# Patient Record
Sex: Male | Born: 1950
Health system: Southern US, Community
[De-identification: ages and names within clinical notes are randomized; demographics above are authoritative.]

## PROBLEM LIST (undated history)

## (undated) DIAGNOSIS — I10 Essential (primary) hypertension: Secondary | ICD-10-CM

## (undated) DIAGNOSIS — I5022 Chronic systolic (congestive) heart failure: Secondary | ICD-10-CM

## (undated) DIAGNOSIS — F329 Major depressive disorder, single episode, unspecified: Secondary | ICD-10-CM

## (undated) DIAGNOSIS — I739 Peripheral vascular disease, unspecified: Secondary | ICD-10-CM

## (undated) DIAGNOSIS — N189 Chronic kidney disease, unspecified: Secondary | ICD-10-CM

## (undated) DIAGNOSIS — E119 Type 2 diabetes mellitus without complications: Secondary | ICD-10-CM

## (undated) DIAGNOSIS — F909 Attention-deficit hyperactivity disorder, unspecified type: Secondary | ICD-10-CM

## (undated) DIAGNOSIS — N183 Chronic kidney disease, stage 3 unspecified: Secondary | ICD-10-CM

## (undated) DIAGNOSIS — I509 Heart failure, unspecified: Secondary | ICD-10-CM

## (undated) DIAGNOSIS — R188 Other ascites: Secondary | ICD-10-CM

## (undated) DIAGNOSIS — E0822 Diabetes mellitus due to underlying condition with diabetic chronic kidney disease: Secondary | ICD-10-CM

## (undated) DIAGNOSIS — G4733 Obstructive sleep apnea (adult) (pediatric): Secondary | ICD-10-CM

## (undated) DIAGNOSIS — F32A Depression, unspecified: Secondary | ICD-10-CM

## (undated) DIAGNOSIS — I4891 Unspecified atrial fibrillation: Secondary | ICD-10-CM

## (undated) DIAGNOSIS — B353 Tinea pedis: Secondary | ICD-10-CM

## (undated) DIAGNOSIS — R011 Cardiac murmur, unspecified: Secondary | ICD-10-CM

## (undated) HISTORY — DX: Cardiac murmur, unspecified: R01.1

## (undated) HISTORY — PX: NOSE SURGERY: SHX723

## (undated) HISTORY — DX: Attention-deficit hyperactivity disorder, unspecified type: F90.9

## (undated) HISTORY — DX: Diabetes mellitus due to underlying condition with diabetic chronic kidney disease: E08.22

## (undated) HISTORY — PX: VASECTOMY: SHX75

## (undated) HISTORY — DX: Tinea pedis: B35.3
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
## 1998-12-04 ENCOUNTER — Ambulatory Visit (HOSPITAL_COMMUNITY): Admission: RE | Admit: 1998-12-04 | Discharge: 1998-12-05 | Payer: Self-pay | Admitting: *Deleted

## 1999-05-14 ENCOUNTER — Ambulatory Visit (HOSPITAL_COMMUNITY): Admission: RE | Admit: 1999-05-14 | Discharge: 1999-05-15 | Payer: Self-pay | Admitting: *Deleted

## 2003-03-03 ENCOUNTER — Inpatient Hospital Stay (HOSPITAL_COMMUNITY): Admission: AD | Admit: 2003-03-03 | Discharge: 2003-03-08 | Payer: Self-pay | Admitting: Psychiatry

## 2009-02-21 ENCOUNTER — Emergency Department (HOSPITAL_COMMUNITY): Admission: EM | Admit: 2009-02-21 | Discharge: 2009-02-21 | Payer: Self-pay | Admitting: Emergency Medicine

## 2009-02-22 ENCOUNTER — Inpatient Hospital Stay (HOSPITAL_COMMUNITY): Admission: EM | Admit: 2009-02-22 | Discharge: 2009-03-06 | Payer: Self-pay | Admitting: Psychiatry

## 2009-02-22 ENCOUNTER — Ambulatory Visit: Payer: Self-pay | Admitting: Psychiatry

## 2009-03-30 ENCOUNTER — Emergency Department (HOSPITAL_COMMUNITY): Admission: EM | Admit: 2009-03-30 | Discharge: 2009-03-30 | Payer: Self-pay | Admitting: Emergency Medicine

## 2009-03-31 ENCOUNTER — Inpatient Hospital Stay (HOSPITAL_COMMUNITY): Admission: EM | Admit: 2009-03-31 | Discharge: 2009-04-14 | Payer: Self-pay | Admitting: Psychiatry

## 2009-03-31 ENCOUNTER — Ambulatory Visit: Payer: Self-pay | Admitting: Psychiatry

## 2009-03-31 ENCOUNTER — Emergency Department (HOSPITAL_COMMUNITY): Admission: EM | Admit: 2009-03-31 | Discharge: 2009-03-31 | Payer: Self-pay | Admitting: Emergency Medicine

## 2009-06-17 ENCOUNTER — Ambulatory Visit: Payer: Self-pay | Admitting: Family Medicine

## 2009-07-21 ENCOUNTER — Emergency Department (HOSPITAL_COMMUNITY): Admission: EM | Admit: 2009-07-21 | Discharge: 2009-07-21 | Payer: Self-pay | Admitting: Emergency Medicine

## 2009-09-25 ENCOUNTER — Ambulatory Visit: Payer: Self-pay | Admitting: Internal Medicine

## 2009-09-25 ENCOUNTER — Encounter: Payer: Self-pay | Admitting: Family Medicine

## 2009-09-25 LAB — CONVERTED CEMR LAB
AST: 21 units/L (ref 0–37)
Albumin: 4.5 g/dL (ref 3.5–5.2)
Alkaline Phosphatase: 34 units/L — ABNORMAL LOW (ref 39–117)
Basophils Absolute: 0 10*3/uL (ref 0.0–0.1)
Benzodiazepines.: NEGATIVE
Creatinine,U: 238.5 mg/dL
Hemoglobin: 13.4 g/dL (ref 13.0–17.0)
LDL Cholesterol: 83 mg/dL (ref 0–99)
Lymphocytes Relative: 28 % (ref 12–46)
Monocytes Absolute: 0.5 10*3/uL (ref 0.1–1.0)
Neutro Abs: 3.9 10*3/uL (ref 1.7–7.7)
Opiate Screen, Urine: NEGATIVE
Phencyclidine (PCP): NEGATIVE
Platelets: 207 10*3/uL (ref 150–400)
Potassium: 3.7 meq/L (ref 3.5–5.3)
Propoxyphene: NEGATIVE
RDW: 13.7 % (ref 11.5–15.5)
Sodium: 144 meq/L (ref 135–145)
TSH: 2.049 microintl units/mL (ref 0.350–4.500)
Total Protein: 7.4 g/dL (ref 6.0–8.3)

## 2009-10-02 ENCOUNTER — Ambulatory Visit: Payer: Self-pay | Admitting: Internal Medicine

## 2011-02-16 LAB — BASIC METABOLIC PANEL
BUN: 19 mg/dL (ref 6–23)
CO2: 34 mEq/L — ABNORMAL HIGH (ref 19–32)
Calcium: 9.3 mg/dL (ref 8.4–10.5)
Chloride: 97 mEq/L (ref 96–112)
Creatinine, Ser: 1.22 mg/dL (ref 0.4–1.5)
GFR calc Af Amer: >60 mL/min (ref >60)
GFR calc non Af Amer: >60 mL/min (ref >60)
Glucose, Bld: 117 mg/dL — ABNORMAL HIGH (ref 70–99)
Potassium: 3.7 mEq/L (ref 3.5–5.1)
Sodium: 138 mEq/L (ref 135–145)

## 2011-02-17 LAB — RAPID URINE DRUG SCREEN, HOSP PERFORMED
Amphetamines: NOT DETECTED
Benzodiazepines: NOT DETECTED
Cocaine: NOT DETECTED

## 2011-02-17 LAB — CBC
HCT: 40.6 % (ref 39.0–52.0)
Platelets: 194 10*3/uL (ref 150–400)
WBC: 8.4 10*3/uL (ref 4.0–10.5)

## 2011-02-17 LAB — BASIC METABOLIC PANEL
BUN: 10 mg/dL (ref 6–23)
Calcium: 9.1 mg/dL (ref 8.4–10.5)
GFR calc non Af Amer: >60 mL/min (ref >60)
Potassium: 3.2 mEq/L — ABNORMAL LOW (ref 3.5–5.1)

## 2011-02-17 LAB — DIFFERENTIAL
Basophils Absolute: 0 10*3/uL (ref 0.0–0.1)
Eosinophils Relative: 1 % (ref 0–5)
Lymphocytes Relative: 20 % (ref 12–46)
Lymphs Abs: 1.7 10*3/uL (ref 0.7–4.0)
Neutro Abs: 6 10*3/uL (ref 1.7–7.7)
Neutrophils Relative %: 72 % (ref 43–77)

## 2011-02-17 LAB — ETHANOL: Alcohol, Ethyl (B): <5 mg/dL (ref 0–10)

## 2011-02-18 LAB — BASIC METABOLIC PANEL
BUN: 23 mg/dL (ref 6–23)
Calcium: 9.2 mg/dL (ref 8.4–10.5)
Creatinine, Ser: 0.97 mg/dL (ref 0.4–1.5)
GFR calc Af Amer: >60 mL/min (ref >60)

## 2011-02-18 LAB — DIFFERENTIAL
Lymphocytes Relative: 23 % (ref 12–46)
Lymphs Abs: 2 10*3/uL (ref 0.7–4.0)
Monocytes Absolute: 0.8 10*3/uL (ref 0.1–1.0)
Monocytes Relative: 9 % (ref 3–12)
Neutro Abs: 5.9 10*3/uL (ref 1.7–7.7)
Neutrophils Relative %: 67 % (ref 43–77)

## 2011-02-18 LAB — URINALYSIS, ROUTINE W REFLEX MICROSCOPIC
Glucose, UA: NEGATIVE mg/dL
Hgb urine dipstick: NEGATIVE
Leukocytes, UA: NEGATIVE
Protein, ur: 30 mg/dL — AB
Specific Gravity, Urine: 1.021 (ref 1.005–1.030)
pH: 7.5 (ref 5.0–8.0)

## 2011-02-18 LAB — URINE MICROSCOPIC-ADD ON

## 2011-02-18 LAB — COMPREHENSIVE METABOLIC PANEL
Albumin: 4 g/dL (ref 3.5–5.2)
Alkaline Phosphatase: 33 U/L — ABNORMAL LOW (ref 39–117)
BUN: 10 mg/dL (ref 6–23)
Calcium: 9.8 mg/dL (ref 8.4–10.5)
Glucose, Bld: 119 mg/dL — ABNORMAL HIGH (ref 70–99)
Potassium: 3.3 mEq/L — ABNORMAL LOW (ref 3.5–5.1)
Total Protein: 6.8 g/dL (ref 6.0–8.3)

## 2011-02-18 LAB — CBC
HCT: 41.6 % (ref 39.0–52.0)
Hemoglobin: 14.6 g/dL (ref 13.0–17.0)
MCHC: 35.1 g/dL (ref 30.0–36.0)
Platelets: 200 10*3/uL (ref 150–400)
RDW: 12.8 % (ref 11.5–15.5)

## 2011-02-18 LAB — TRICYCLICS SCREEN, URINE: TCA Scrn: NOT DETECTED

## 2011-02-18 LAB — ACETAMINOPHEN LEVEL: Acetaminophen (Tylenol), Serum: <10 ug/mL — ABNORMAL LOW (ref 10–30)

## 2011-02-18 LAB — ETHANOL: Alcohol, Ethyl (B): <5 mg/dL (ref 0–10)

## 2011-02-18 LAB — RAPID URINE DRUG SCREEN, HOSP PERFORMED
Cocaine: NOT DETECTED
Tetrahydrocannabinol: NOT DETECTED

## 2011-02-18 LAB — TSH: TSH: 2.813 u[IU]/mL (ref 0.350–4.500)

## 2011-03-24 NOTE — Discharge Summary (Signed)
NAME:  [name], [name] NO.:  [id number]   MEDICAL RECORD NO.:  [id number]          PATIENT TYPE:  IPS   LOCATION:  0505                          FACILITY:  [facility]   PHYSICIAN:  Geoffery Lyons, M.D.      DATE OF BIRTH:  [birth year]   DATE OF ADMISSION:  02/22/2009  DATE OF DISCHARGE:  03/06/2009                               DISCHARGE SUMMARY   CHIEF COMPLAINT:  This was the second admission to [facility] for this 59 year old male voluntarily admitted.  Presented with  fears of being homeless, suicidal without a plan due to pending  homelessness.  Denies actual current treatment.   PAST PSYCHIATRIC HISTORY:  Had been in and [facility]  several years ago, previously treated with Lexapro as well as Concerta,  Geodon, and Risperdal.   ALCOHOL AND DRUG HISTORY:  Denies active use of any substances.   MEDICAL HISTORY:  High blood pressure.   MEDICATIONS:  Toprol increased blood pressure.  No other medications  prescribed.   PHYSICAL EXAM:  Failed to show any acute findings.   LABORATORY WORK:  Sodium 136, potassium 3.5, glucose 115, TSH 2.813.   MENTAL STATUS EXAMINATION:  Upon admission revealed an alert male who is  not spontaneous.  Very reserved, very guarded.  Mood depressed.  Affect  constricted.  Thought processes logical, coherent and relevant, but no  spontaneous content.  Limits himself to answer what is asked for with  some delay.  Suicidal ruminations with no plan or intent.  No homicidal  ideas, no delusions, hallucinations.  Cognition well preserved.   ADMITTING DIAGNOSES:  AXIS I:  Major depressive disorder.  AXIS II:  No diagnosis.  AXIS III:  High blood pressure.  AXIS IV:  Moderate.  AXIS V:  On admission 35.  Highest GAF in the last year 60.   COURSE IN THE HOSPITAL:  Was admitted, started individual and group  psychotherapy.  As already stated, endorsed persistent fear of  homelessness.  Endorsed she cannot  continue living like this anymore.  No support and cannot do the shelter life.  Has not worked since May  2008 and says that this is not the kind of existence he wants to have.  Endorsed that his life as he knew it had ended.  He continued to deal  with the depression and sense of hopeless and helplessness.  Endorsed  that the only option he sees his going to a shelter and that he cannot  do that.  Endorsed depression.  Thoughts of hopeless, helplessness, some  anxiety so we it was given some Ativan.  No recollection of having been  in [facility] in 2004.  Continued to exhibit this  psychomotor retardation, depressed mood and affect.  We went ahead and  started the Zoloft and we continued to work on increasing the dose of  Zoloft.  This stay was characterized by psychomotor retardation, delay  in answering questions, not seeing many options.  Having a hard time  coming up with any possible resources.  April 26, still with some  psychomotor retardation, depressed mood and affect, feeling immobilized.  No communication from the outside.  No options.  Would probably have to  go to a shelter.  We worked with Zoloft and Remeron for sleep.  We  increased Zoloft to 150.  By April 27, not a lot of movement.  Still  with psychomotor retardation.  Questioned how long this had been his  baseline functioning.  Endorsed no suicidal ideas, no plan.  No intent.  He worried where he was going to go, but endorsed he would not hurt  himself.  April 28, he was in full contact reality.  There were no  active suicidal ideas, no hallucinations or delusions, maybe a little  better, in need of further outpatient followup as well as the need to  help him mobilize resources to find some more stable living  arrangements.   DISCHARGE DIAGNOSES:  AXIS I:  Major depression, recurrent.  AXIS II:  No diagnosis.  AXIS III:  Active hypertension.  AXIS IV:  Moderate.  AXIS V:  On discharge 50.    DISCHARGE MEDICATIONS:  1. Norvasc 10 mg per day.  2. Hydrochlorothiazide 25 mg per day.  3. Zoloft 100 mg 1-1/2 per day.  4. Remeron 50 mg at bedtime.   FOLLOW UP:  [facility] and [facility]. Lang Snow at [facility].      Geoffery Lyons, M.D.  Electronically Signed     IL/MEDQ  D:  03/19/2009  T:  03/19/2009  Job:  [id number]

## 2011-03-27 NOTE — Discharge Summary (Signed)
NAME:  [name], [name]             ACCOUNT NO.:  [id number]   MEDICAL RECORD NO.:  [id number]          PATIENT TYPE:  IPS   LOCATION:  0500                          FACILITY:  [facility]   PHYSICIAN:  Geoffery Lyons, M.D.      DATE OF BIRTH:  [birth year]   DATE OF ADMISSION:  03/31/2009  DATE OF DISCHARGE:  04/14/2009                               DISCHARGE SUMMARY   CHIEF COMPLAINT/PRESENT ILLNESS:  This was one of multiple admissions to  [facility] for this 60 year old divorced male with  history of depression, persistence of suicide planning to jump off a  bridge.  Once discharged from our unit last time, he did not go to a  shelter.  He was sleeping on a motel floor.  He claimed he has lost his  medications.   PAST PSYCHIATRIC HISTORY:  Multiple admissions, last one was February 16, 2009.  He was suicidal then and very similar presentation.   ALCOHOL/DRUG HISTORY:  Claims no active use of any substances.   MEDICAL HISTORY:  Arterial hypertension.   MEDICATIONS:  He was last time discharged on:  1. Norvasc 10 mg per day.  2. Hydrochlorothiazide 25 mg per day.  3. Zoloft 150 mg per day.  4. Remeron 45 mg at bedtime.   PHYSICAL EXAMINATION:  Failed to show any acute findings.   LABORATORY WORKUP:  Sodium 139, potassium 3.7, glucose 117, BUN 19,  creatinine 1.22.   MENTAL STATUS EXAM:  Reveals alert, cooperative male.  Mood depressed.  Affect depressed.  Psychomotor retardation.  Disheveled, very poor  hygiene.  Speech, offers very little information.  Seems to have  psychomotor retardation.  Decreased production.  Endorsed also suicide  plan to jump off a bridge.  Felt safe in the unit.  No homicide ideas.  No delusions.  No hallucinations.  Cognition well preserved.   ADMISSION DIAGNOSES:  AXIS I:  Major depressive disorder severe,  recurrent.  AXIS II:  No diagnosis.  AXIS III:  Arterial hypertension.  AXIS IV:  Moderate.  AXIS V:  Upon admission 38, global  assessment of functioning in the last  year 50.   COURSE IN THE HOSPITAL:  He was admitted.  Started individual and group  psychotherapy.  As already stated, a 60 year old male with major  depression, recently discharged from our unit, long-term history of  dysfunction, multiple psychosocial needs including unemployment,  hopelessness, estranged from the family.  No support.  After he left  last time, he endorsed that he took medication for a little while, on no  medications right now.  Increasing sense of depression.  Thoughts of  suicide.  Still claimed no alcohol.  No drugs.  The case worker started  working from the get go to try to secure a placement.  He continued to  endorse he was at a lost as far as what to do.  He has not been able to  hold a job, increased depression, persistent depressed mood and affect,  psychomotor retardation.  He slept better.  We placed him back on  Remeron.  He continued to endorse  lack of energy and lack of motivation.  There was previous opiate abuse.  We continued to work on trying to get  medications back in place.  Understanding that he was dealing with a lot  of psychosocial stressors.  He was trying to get his life back together.  On Apr 04, 2009,  willing to go to a shelter that was available.  [facility]  that [facility] trying to get his mood improved.  He was given some Neurontin as he was endorsing anxiety, but mostly the  worry was based on the fact that he did have a place to go, no source of  income, no support.  He was trying to enter with his sister.  Every time  we set a  discharge day, he got more upset, more anxious, overwhelmed.  April 09, 2009, would consider eliminating himself.  Cannot see how the  situation was going to change.  We continued to try to address his  needs, but he was very specific that he would not see himself going to a  shelter.  He would rather kill himself first.  As he started thinking  about  his situation, he got more upset.  He continued to explore option  the possibility of going back with a friend that he had stayed with  before.  Then he spoke with the sister and she seemed to be willing to  help.  We are looking to different shelters, [facility].  In the  next 48 hours, he contacted someone he knew and he might be able to get  a job.  April 15, 2009, he had obtained full benefit from the  hospitalization.  A bed was identified at the [facility].  He was  discharged to go there and he had some connections to get a job.  As he  gets some money, he was going to be able to secure another placement.   DISCHARGE DIAGNOSES:  AXIS I:  Major depression, recurrent.  Anxiety  disorder not otherwise specified.  AXIS II:  No diagnosis.  AXIS III:  Arterial hypertension.  AXIS IV:  Moderate.  AXIS V:  Upon discharge 50.   DISCHARGE MEDICATIONS:  1. Remeron 30 mg at bedtime.  2. Hydrochlorothiazide 25 mg per day.  3. Norvasc 10 mg per day.   FOLLOW UP:  Follow up at the [facility].      Geoffery Lyons, M.D.  Electronically Signed     IL/MEDQ  D:  05/16/2009  T:  05/16/2009  Job:  [id number]
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
## 2011-03-27 NOTE — H&P (Signed)
   NAME:  [name], [name]                       ACCOUNT NO.:  [id number]   MEDICAL RECORD NO.:  [id number]                   PATIENT TYPE:  IPS   LOCATION:  0504                                 [facility]ILITY[facility]   PHYSICIAN:  Jeanice Lim, M.D.              DATE OF BIRTH:   DATE OF ADMISSION:  03/03/2003  DATE OF DISCHARGE:  03/08/2003                         PSYCHIATRIC ADMISSION ASSESSMENT   IDENTIFYING INFORMATION:  This is a 60 year old white male who is married.  This is a voluntary admission.   HISTORY OF PRESENT ILLNESS:  This patient, who goes by the name of [name],  presented to the hospital referred by his psychotherapist for  neurovegetative symptoms which had been increasing since some marital  conflict and started in December 2003.  The patient had been noted  neglecting his hygiene, not eating in the past 2-3 days, having considerable  thought blocking on initial interview at the time of assessment, and had  quite an angry affect.  In spite of his angry affect, he eventually agreed  to a voluntary admission.   He reports today that two days ago he had thoughts of cutting a major  artery.  When we attempted to discuss this with him today, he states, Oh,  everyone has those thoughts all the time.  That is not anything unusual.  The patient reports that his wife left him in December and he apparently has  reported some weight loss of approximately 50 pounds since she left.   PAST PSYCHIATRIC HISTORY:  The patient is followed by Dr. Elna Breslow as an  outpatient.  This is his first inpatient admission.  The patient was  referred for admission by Eugene Gavia who is his psychotherapist.  He  has a history of depression in the past, but it was not clear if he was ever  compliant with medications.   SOCIAL HISTORY:  The patient has a college education and a degree in  education.  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  He expresses  considerable burdens with  trying to pay child support to his ex-wife and  states that his job is currently at risk due to his personal life stressors  and his inability to concentrate or focus.   FAMILY HISTORY:  Not available.   ALCOHOL AND DRUG HISTORY:  The patient denies any substance abuse.   MEDICAL HISTORY:  The patient is followed by Dr. Marga Melnick, his primary  care physician.   Dictation ended at this point.     Margaret A. Stephannie Peters                   Jeanice Lim, M.D.    MAS/MEDQ  D:  04/11/2003  T:  04/11/2003  Job:  [id number]

## 2011-03-27 NOTE — Discharge Summary (Signed)
NAME:  [name], [name]                       ACCOUNT NO.:  [id number]   MEDICAL RECORD NO.:  [id number]                   PATIENT TYPE:  IPS   LOCATION:  0504                                 [facility]   PHYSICIAN:  Jeanice Lim, M.D.              DATE OF BIRTH:  1951/06/23   DATE OF ADMISSION:  03/03/2003  DATE OF DISCHARGE:  03/08/2003                                 DISCHARGE SUMMARY   IDENTIFYING DATA:  This is a 60 year old Caucasian male, married,  voluntarily admitted, reporting increase in agitative symptoms of  depression, having marital conflict, not eating for 3 days, thought  blocking, angry and felt like cutting a major artery as per patient.   ADMISSION MEDICATIONS:  Lexapro, Concerta and Toprol.   ALLERGIES:  No known drug allergies.   PHYSICAL EXAMINATION:  Essentially within normal limits, neurologically  nonfocal.   ROUTINE ADMISSION LABS:  Essentially within normal limits..   MENTAL STATUS EXAM:  Well-nourished, well-developed male, irritable, angry  about being here.  Resistant to interview.  Irritable but not threatening.  Mood was angry.  Thought process was goal directed, positive for suicidal  ideation, considering cutting or poisoning himself.  Cognition was intact.  Judgment and insight poor.  Positive for mild agitation at times.   ADMISSION DIAGNOSES:   AXIS I:  1. Major depressive disorder, recurrent, severe.  2. Rule out psychotic features.   AXIS II:  None.   AXIS III:  Hypertension by history.   AXIS IV:  Severe.  Marital stress, conflict, limited support system.   AXIS V:  20/70.   HOSPITAL COURSE:  The patient was admitted and ordered routine p.r.n.  medications, underwent further monitoring, and was encouraged to participate  in individual, group and milieu therapy.  The patient was not caring for  hygiene.  Thoughts were delayed.  He was angry, dysphoric, feeling hopeless,  helpless and psychomotor retarded, somewhat  guarded, with fleeting suicidal  ideation.  The patient was resumed on Risperdal and Ativan p.r.n. and  Lexapro, targeting depressive symptoms and agitation and the patient was  optimized on Geodon and Risperdal was discontinued, to target depressive  symptoms and possible psychotic symptoms.  Geodon was discontinued.  Ativan  discontinued.  The patient reported that he would be willing to take Lexapro  but not the other medications as he was not having any psychotic symptoms  and just felt helpless and hopeless as well as quite passive, feeling that  he could not do anything and had had no options.  The patient had one on one  counseling as well as group therapy, which the patient participated in.  The  patient gradually reported positive response to clinical intervention and  assistance with psychosocial stressors and problem solving, learning  healthier coping skills.   CONDITION ON DISCHARGE:  Markedly improved.  Mood was more euthymic, affect  somewhat brighter, thought process more goal directed.  Thought content  negative for dangerous ideation or psychotic symptoms.  The patient reported  motivation to be compliant with the aftercare plan.  The patient thought he  could cope with the stress and had an appropriate plan to address his  stressors.   DISCHARGE MEDICATIONS:  Lexapro 20 mg q.a.m.   DISPOSITION:  To follow up with Shayne Alken, therapist, on May 13 at 9 a.m.  and Dr. Katrinka Blazing May 4 at 12.   DISCHARGE DIAGNOSES:   AXIS I:  1. Major depressive disorder, recurrent, severe.  2. Rule out psychotic features.   AXIS II:  None.   AXIS III:  Hypertension by history.   AXIS IV:  Severe.  Marital stress, conflict, limited support system.   AXIS V:  Global assessment of function on discharge was 55.                                                 Jeanice Lim, M.D.    JEM/MEDQ  D:  03/29/2003  T:  03/30/2003  Job:  [id number]

## 2014-05-08 ENCOUNTER — Inpatient Hospital Stay (HOSPITAL_COMMUNITY)
Admission: EM | Admit: 2014-05-08 | Discharge: 2014-05-16 | DRG: 292 | Disposition: A | Discharge status: Home / Self Care | Payer: Self-pay | Attending: Internal Medicine | Admitting: Internal Medicine

## 2014-05-08 ENCOUNTER — Emergency Department (HOSPITAL_COMMUNITY): Payer: Self-pay

## 2014-05-08 ENCOUNTER — Encounter (HOSPITAL_COMMUNITY): Payer: Self-pay | Admitting: Emergency Medicine

## 2014-05-08 DIAGNOSIS — E1122 Type 2 diabetes mellitus with diabetic chronic kidney disease: Secondary | ICD-10-CM | POA: Diagnosis present

## 2014-05-08 DIAGNOSIS — IMO0002 Reserved for concepts with insufficient information to code with codable children: Secondary | ICD-10-CM

## 2014-05-08 DIAGNOSIS — M7989 Other specified soft tissue disorders: Secondary | ICD-10-CM

## 2014-05-08 DIAGNOSIS — R188 Other ascites: Secondary | ICD-10-CM

## 2014-05-08 DIAGNOSIS — G4733 Obstructive sleep apnea (adult) (pediatric): Secondary | ICD-10-CM

## 2014-05-08 DIAGNOSIS — N183 Chronic kidney disease, stage 3 unspecified: Secondary | ICD-10-CM

## 2014-05-08 DIAGNOSIS — Z87891 Personal history of nicotine dependence: Secondary | ICD-10-CM

## 2014-05-08 DIAGNOSIS — I4891 Unspecified atrial fibrillation: Secondary | ICD-10-CM

## 2014-05-08 DIAGNOSIS — E1165 Type 2 diabetes mellitus with hyperglycemia: Secondary | ICD-10-CM | POA: Diagnosis present

## 2014-05-08 DIAGNOSIS — I428 Other cardiomyopathies: Secondary | ICD-10-CM | POA: Diagnosis present

## 2014-05-08 DIAGNOSIS — N182 Chronic kidney disease, stage 2 (mild): Secondary | ICD-10-CM

## 2014-05-08 DIAGNOSIS — E876 Hypokalemia: Secondary | ICD-10-CM | POA: Diagnosis present

## 2014-05-08 DIAGNOSIS — R0600 Dyspnea, unspecified: Secondary | ICD-10-CM

## 2014-05-08 DIAGNOSIS — Z7901 Long term (current) use of anticoagulants: Secondary | ICD-10-CM

## 2014-05-08 DIAGNOSIS — L97809 Non-pressure chronic ulcer of other part of unspecified lower leg with unspecified severity: Secondary | ICD-10-CM | POA: Diagnosis present

## 2014-05-08 DIAGNOSIS — Z79899 Other long term (current) drug therapy: Secondary | ICD-10-CM

## 2014-05-08 DIAGNOSIS — I872 Venous insufficiency (chronic) (peripheral): Secondary | ICD-10-CM

## 2014-05-08 DIAGNOSIS — I5022 Chronic systolic (congestive) heart failure: Secondary | ICD-10-CM

## 2014-05-08 DIAGNOSIS — Z6835 Body mass index (BMI) 35.0-35.9, adult: Secondary | ICD-10-CM

## 2014-05-08 DIAGNOSIS — E669 Obesity, unspecified: Secondary | ICD-10-CM

## 2014-05-08 DIAGNOSIS — E1129 Type 2 diabetes mellitus with other diabetic kidney complication: Secondary | ICD-10-CM | POA: Diagnosis present

## 2014-05-08 DIAGNOSIS — I509 Heart failure, unspecified: Secondary | ICD-10-CM | POA: Diagnosis present

## 2014-05-08 DIAGNOSIS — F3289 Other specified depressive episodes: Secondary | ICD-10-CM | POA: Diagnosis present

## 2014-05-08 DIAGNOSIS — I517 Cardiomegaly: Secondary | ICD-10-CM

## 2014-05-08 DIAGNOSIS — N058 Unspecified nephritic syndrome with other morphologic changes: Secondary | ICD-10-CM | POA: Diagnosis present

## 2014-05-08 DIAGNOSIS — F32A Depression, unspecified: Secondary | ICD-10-CM

## 2014-05-08 DIAGNOSIS — I5023 Acute on chronic systolic (congestive) heart failure: Principal | ICD-10-CM | POA: Diagnosis present

## 2014-05-08 DIAGNOSIS — I5021 Acute systolic (congestive) heart failure: Secondary | ICD-10-CM

## 2014-05-08 DIAGNOSIS — N179 Acute kidney failure, unspecified: Secondary | ICD-10-CM

## 2014-05-08 DIAGNOSIS — F329 Major depressive disorder, single episode, unspecified: Secondary | ICD-10-CM | POA: Diagnosis present

## 2014-05-08 LAB — CBC
HEMATOCRIT: 41.8 % (ref 39.0–52.0)
Hemoglobin: 13.5 g/dL (ref 13.0–17.0)
MCH: 28.8 pg (ref 26.0–34.0)
MCHC: 32.3 g/dL (ref 30.0–36.0)
MCV: 89.1 fL (ref 78.0–100.0)
PLATELETS: 175 10*3/uL (ref 150–400)
RBC: 4.69 MIL/uL (ref 4.22–5.81)
RDW: 14.4 % (ref 11.5–15.5)
WBC: 10 10*3/uL (ref 4.0–10.5)

## 2014-05-08 LAB — BASIC METABOLIC PANEL
BUN: 40 mg/dL — AB (ref 6–23)
CALCIUM: 8.8 mg/dL (ref 8.4–10.5)
CO2: 22 meq/L (ref 19–32)
CREATININE: 1.95 mg/dL — AB (ref 0.50–1.35)
Chloride: 101 mEq/L (ref 96–112)
GFR calc Af Amer: 40 mL/min — ABNORMAL LOW (ref >90)
GFR calc non Af Amer: 35 mL/min — ABNORMAL LOW (ref >90)
Glucose, Bld: 162 mg/dL — ABNORMAL HIGH (ref 70–99)
Potassium: 4.7 mEq/L (ref 3.7–5.3)
Sodium: 139 mEq/L (ref 137–147)

## 2014-05-08 LAB — PRO B NATRIURETIC PEPTIDE: PRO B NATRI PEPTIDE: 3681 pg/mL — AB (ref 0–125)

## 2014-05-08 MED ORDER — DEXTROSE 5 % IV SOLN
5.0000 mg/h | INTRAVENOUS | Status: DC
  Administered 2014-05-08: 5 mg/h via INTRAVENOUS
  Administered 2014-05-09: 10 mg/h via INTRAVENOUS

## 2014-05-08 MED ORDER — DILTIAZEM LOAD VIA INFUSION
20.0000 mg | Freq: Once | INTRAVENOUS | Status: AC
  Administered 2014-05-08: 20 mg via INTRAVENOUS
  Filled 2014-05-08: qty 20

## 2014-05-08 NOTE — ED Provider Notes (Signed)
CSN: [id number]     Arrival date & time 05/08/14  2005 History   First MD Initiated Contact with Patient 05/08/14 2205     Chief Complaint  Patient presents with  . Leg Swelling    Bilateral  . Numbness    Legs     HPI  Vision presents with Lotrimin swelling itching and pain. He states for the last 2 months he has had swelling in his feet. He hasn't seen a doctor for "decades". Sometimes he is kept awake by leg itching and paresthesias, sometimes he wakes up short of breath. Does not describe any chest pain or palpitations. Still urinating. No history of liver kidney or heart problems to his knowledge.  History reviewed. No pertinent past medical history. Past Surgical History  Procedure Laterality Date  . Vasectomy     History reviewed. No pertinent family history. History  Substance Use Topics  . Smoking status: Former Games developer  . Smokeless tobacco: Not on file  . Alcohol Use: No    Review of Systems  Constitutional: Negative for fever, chills, diaphoresis, appetite change and fatigue.  HENT: Negative for mouth sores, sore throat and trouble swallowing.   Eyes: Negative for visual disturbance.  Respiratory: Positive for shortness of breath. Negative for cough, chest tightness and wheezing.   Cardiovascular: Positive for leg swelling. Negative for chest pain.       PND and orthopnea  Gastrointestinal: Negative for nausea, vomiting, abdominal pain, diarrhea and abdominal distention.  Endocrine: Negative for polydipsia, polyphagia and polyuria.  Genitourinary: Negative for dysuria, frequency and hematuria.  Musculoskeletal: Negative for gait problem.  Skin: Negative for color change, pallor and rash.       Itching of the skin of the lower extremities.  Neurological: Negative for dizziness, syncope, light-headedness and headaches.  Hematological: Does not bruise/bleed easily.  Psychiatric/Behavioral: Negative for behavioral problems and confusion.      Allergies  Review  of patient's allergies indicates no known allergies.  Home Medications   Prior to Admission medications   Not on File   BP 134/95  Pulse 33  Temp(Src) 98.9 F (37.2 C) (Oral)  Resp 16  Ht 5\' 8"  (1.727 m)  Wt 255 lb (115.667 kg)  BMI 38.78 kg/m2  SpO2 94% Physical Exam  Constitutional: He is oriented to person, place, and time. He appears well-developed and well-nourished. No distress.  HENT:  Head: Normocephalic.  Eyes: Conjunctivae are normal. Pupils are equal, round, and reactive to light. No scleral icterus.  Neck: Normal range of motion. Neck supple. No thyromegaly present.  Cardiovascular: An irregular rhythm present. Tachycardia present.  Exam reveals no gallop and no friction rub.   No murmur heard. A. fib clinically comment on the monitor.  Pulmonary/Chest: Effort normal and breath sounds normal. No respiratory distress. He has no wheezes. He has no rales.  No crackles or rales. No murmur. No gallop.  Abdominal: Soft. Bowel sounds are normal. He exhibits no distension. There is no tenderness. There is no rebound.  Musculoskeletal: Normal range of motion.  Neurological: He is alert and oriented to person, place, and time.  Skin: Skin is warm and dry. No rash noted.  2+ symmetric lower extremity edema.  Psychiatric: He has a normal mood and affect. His behavior is normal.    ED Course  CRITICAL CARE Performed by: Rolland Porter Authorized by: Rolland Porter Total critical care time: 85 minutes Critical care start time: 05/09/2014 10:55 PM Critical care end time: 05/09/2014 12:40 AM Critical care  time was exclusive of separately billable procedures and treating other patients. Critical care was necessary to treat or prevent imminent or life-threatening deterioration of the following conditions: Nature fibrillation with rapid ventricular response. Critical care was time spent personally by me on the following activities: blood draw for specimens, development of treatment plan  with patient or surrogate, discussions with consultants, discussions with primary Dequante Tremaine, interpretation of cardiac output measurements, evaluation of patient's response to treatment, examination of patient, obtaining history from patient or surrogate, ordering and performing treatments and interventions, ordering and review of laboratory studies, ordering and review of radiographic studies, pulse oximetry and re-evaluation of patient's condition (Bolus and infusion of Cardizem).   (including critical care time) Labs Review Labs Reviewed  BASIC METABOLIC PANEL - Abnormal; Notable for the following:    Glucose, Bld 162 (*)    BUN 40 (*)    Creatinine, Ser 1.95 (*)    GFR calc non Af Amer 35 (*)    GFR calc Af Amer 40 (*)    All other components within normal limits  PRO B NATRIURETIC PEPTIDE - Abnormal; Notable for the following:    Pro B Natriuretic peptide (BNP) 3681.0 (*)    All other components within normal limits  CBC  TROPONIN I  URINALYSIS, ROUTINE W REFLEX MICROSCOPIC    Imaging Review Dg Chest 2 View  05/08/2014   CLINICAL DATA:  Leg swelling and numbness.  EXAM: CHEST  2 VIEW  COMPARISON:  None.  FINDINGS: Mild to moderate cardiomegaly. No concerning upper mediastinal contour change. Mild hypoaeration with interstitial crowding at the bases. No consolidation, edema, effusion, or pneumothorax.  IMPRESSION: Cardiomegaly without failure.   Electronically Signed   By: Tiburcio PeaJonathan  Watts M.D.   On: 05/08/2014 23:19     EKG Interpretation None      MDM   Final diagnoses:  Atrial fibrillation with rapid ventricular response  Acute kidney injury  Cardiomegaly    Patient given bolus and infusion of Cardizem. Heart rate improves. Most recent check is 103. Still in atrial fibrillation. Chest x-ray shows cardiomegaly, but no congestive heart failure. Labs show acute kidney injury with creatinine 1.95. Elevated BNP.  Plan will be admission.    Rolland PorterMark James, MD 05/09/14 [phone]

## 2014-05-08 NOTE — ED Notes (Signed)
Patient here with complaint of bilateral leg swelling starting about 2 months ago. Explains that the primary reason he presents tonight is paresthesia in his lower legs.

## 2014-05-08 NOTE — ED Notes (Signed)
The patient is unable to give urine specimen at this time. The patient has been advised to use call light for assistance for the restroom. The tech has reported to the RN in charge.

## 2014-05-09 ENCOUNTER — Inpatient Hospital Stay (HOSPITAL_COMMUNITY): Payer: Self-pay

## 2014-05-09 DIAGNOSIS — F329 Major depressive disorder, single episode, unspecified: Secondary | ICD-10-CM | POA: Diagnosis present

## 2014-05-09 DIAGNOSIS — M7989 Other specified soft tissue disorders: Secondary | ICD-10-CM | POA: Diagnosis present

## 2014-05-09 DIAGNOSIS — R609 Edema, unspecified: Secondary | ICD-10-CM

## 2014-05-09 DIAGNOSIS — E669 Obesity, unspecified: Secondary | ICD-10-CM | POA: Diagnosis present

## 2014-05-09 DIAGNOSIS — R0609 Other forms of dyspnea: Secondary | ICD-10-CM

## 2014-05-09 DIAGNOSIS — N179 Acute kidney failure, unspecified: Secondary | ICD-10-CM | POA: Diagnosis present

## 2014-05-09 DIAGNOSIS — I5021 Acute systolic (congestive) heart failure: Secondary | ICD-10-CM | POA: Diagnosis present

## 2014-05-09 DIAGNOSIS — F32A Depression, unspecified: Secondary | ICD-10-CM | POA: Diagnosis present

## 2014-05-09 DIAGNOSIS — N183 Chronic kidney disease, stage 3 unspecified: Secondary | ICD-10-CM | POA: Diagnosis present

## 2014-05-09 DIAGNOSIS — R0989 Other specified symptoms and signs involving the circulatory and respiratory systems: Secondary | ICD-10-CM

## 2014-05-09 DIAGNOSIS — R188 Other ascites: Secondary | ICD-10-CM | POA: Diagnosis present

## 2014-05-09 DIAGNOSIS — E1129 Type 2 diabetes mellitus with other diabetic kidney complication: Secondary | ICD-10-CM

## 2014-05-09 DIAGNOSIS — I369 Nonrheumatic tricuspid valve disorder, unspecified: Secondary | ICD-10-CM

## 2014-05-09 DIAGNOSIS — E1165 Type 2 diabetes mellitus with hyperglycemia: Secondary | ICD-10-CM

## 2014-05-09 DIAGNOSIS — I4891 Unspecified atrial fibrillation: Secondary | ICD-10-CM

## 2014-05-09 LAB — COMPREHENSIVE METABOLIC PANEL
ALT: 42 U/L (ref 0–53)
AST: 36 U/L (ref 0–37)
Albumin: 3.4 g/dL — ABNORMAL LOW (ref 3.5–5.2)
Alkaline Phosphatase: 29 U/L — ABNORMAL LOW (ref 39–117)
BUN: 37 mg/dL — ABNORMAL HIGH (ref 6–23)
CALCIUM: 8.8 mg/dL (ref 8.4–10.5)
CHLORIDE: 96 meq/L (ref 96–112)
CO2: 22 meq/L (ref 19–32)
CREATININE: 1.66 mg/dL — AB (ref 0.50–1.35)
GFR, EST AFRICAN AMERICAN: 49 mL/min — AB (ref >90)
GFR, EST NON AFRICAN AMERICAN: 42 mL/min — AB (ref >90)
Glucose, Bld: 127 mg/dL — ABNORMAL HIGH (ref 70–99)
Potassium: 4.2 mEq/L (ref 3.7–5.3)
Sodium: 134 mEq/L — ABNORMAL LOW (ref 137–147)
Total Bilirubin: 0.6 mg/dL (ref 0.3–1.2)
Total Protein: 6.4 g/dL (ref 6.0–8.3)

## 2014-05-09 LAB — MRSA PCR SCREENING: MRSA by PCR: NEGATIVE

## 2014-05-09 LAB — CBC WITH DIFFERENTIAL/PLATELET
BASOS PCT: 1 % (ref 0–1)
Basophils Absolute: 0.1 10*3/uL (ref 0.0–0.1)
EOS ABS: 0.3 10*3/uL (ref 0.0–0.7)
Eosinophils Relative: 3 % (ref 0–5)
HCT: 39.4 % (ref 39.0–52.0)
Hemoglobin: 12.9 g/dL — ABNORMAL LOW (ref 13.0–17.0)
Lymphocytes Relative: 19 % (ref 12–46)
Lymphs Abs: 1.8 10*3/uL (ref 0.7–4.0)
MCH: 28.8 pg (ref 26.0–34.0)
MCHC: 32.7 g/dL (ref 30.0–36.0)
MCV: 87.9 fL (ref 78.0–100.0)
Monocytes Absolute: 0.7 10*3/uL (ref 0.1–1.0)
Monocytes Relative: 7 % (ref 3–12)
NEUTROS PCT: 70 % (ref 43–77)
Neutro Abs: 6.5 10*3/uL (ref 1.7–7.7)
Platelets: 153 10*3/uL (ref 150–400)
RBC: 4.48 MIL/uL (ref 4.22–5.81)
RDW: 14.5 % (ref 11.5–15.5)
WBC: 9.3 10*3/uL (ref 4.0–10.5)

## 2014-05-09 LAB — CBC
HCT: 39.2 % (ref 39.0–52.0)
Hemoglobin: 13.1 g/dL (ref 13.0–17.0)
MCH: 29.2 pg (ref 26.0–34.0)
MCHC: 33.4 g/dL (ref 30.0–36.0)
MCV: 87.5 fL (ref 78.0–100.0)
PLATELETS: 159 10*3/uL (ref 150–400)
RBC: 4.48 MIL/uL (ref 4.22–5.81)
RDW: 14.4 % (ref 11.5–15.5)
WBC: 9.8 10*3/uL (ref 4.0–10.5)

## 2014-05-09 LAB — URINALYSIS, ROUTINE W REFLEX MICROSCOPIC
BILIRUBIN URINE: NEGATIVE
GLUCOSE, UA: NEGATIVE mg/dL
KETONES UR: 15 mg/dL — AB
Leukocytes, UA: NEGATIVE
NITRITE: NEGATIVE
PH: 5 (ref 5.0–8.0)
Protein, ur: 30 mg/dL — AB
Specific Gravity, Urine: 1.023 (ref 1.005–1.030)
Urobilinogen, UA: 0.2 mg/dL (ref 0.0–1.0)

## 2014-05-09 LAB — HEMOGLOBIN A1C
Hgb A1c MFr Bld: 9 % — ABNORMAL HIGH (ref <5.7)
Mean Plasma Glucose: 212 mg/dL — ABNORMAL HIGH (ref <117)

## 2014-05-09 LAB — TSH: TSH: 2.14 u[IU]/mL (ref 0.350–4.500)

## 2014-05-09 LAB — URINE MICROSCOPIC-ADD ON

## 2014-05-09 LAB — HEPARIN LEVEL (UNFRACTIONATED): HEPARIN UNFRACTIONATED: 0.23 [IU]/mL — AB (ref 0.30–0.70)

## 2014-05-09 LAB — CREATININE, SERUM
Creatinine, Ser: 1.61 mg/dL — ABNORMAL HIGH (ref 0.50–1.35)
GFR calc Af Amer: 51 mL/min — ABNORMAL LOW (ref >90)
GFR calc non Af Amer: 44 mL/min — ABNORMAL LOW (ref >90)

## 2014-05-09 LAB — TROPONIN I: Troponin I: <0.3 ng/mL (ref <0.30)

## 2014-05-09 LAB — GLUCOSE, CAPILLARY: Glucose-Capillary: 203 mg/dL — ABNORMAL HIGH (ref 70–99)

## 2014-05-09 MED ORDER — FUROSEMIDE 10 MG/ML IJ SOLN
40.0000 mg | Freq: Once | INTRAMUSCULAR | Status: AC
  Administered 2014-05-09: 40 mg via INTRAVENOUS

## 2014-05-09 MED ORDER — SODIUM CHLORIDE 0.9 % IJ SOLN
3.0000 mL | Freq: Two times a day (BID) | INTRAMUSCULAR | Status: DC
  Administered 2014-05-09 – 2014-05-16 (×11): 3 mL via INTRAVENOUS

## 2014-05-09 MED ORDER — HEPARIN BOLUS VIA INFUSION
2000.0000 [IU] | Freq: Once | INTRAVENOUS | Status: AC
  Administered 2014-05-09: 2000 [IU] via INTRAVENOUS
  Filled 2014-05-09: qty 2000

## 2014-05-09 MED ORDER — SODIUM CHLORIDE 0.9 % IV SOLN: 250.0000 mL | INTRAVENOUS | Status: DC | PRN

## 2014-05-09 MED ORDER — INSULIN ASPART 100 UNIT/ML ~~LOC~~ SOLN
0.0000 [IU] | Freq: Every day | SUBCUTANEOUS | Status: DC
  Administered 2014-05-09: 2 [IU] via SUBCUTANEOUS

## 2014-05-09 MED ORDER — SODIUM CHLORIDE 0.9 % IJ SOLN: 3.0000 mL | INTRAMUSCULAR | Status: DC | PRN

## 2014-05-09 MED ORDER — SODIUM CHLORIDE 0.9 % IJ SOLN
3.0000 mL | Freq: Two times a day (BID) | INTRAMUSCULAR | Status: DC
  Administered 2014-05-09 – 2014-05-10 (×3): 3 mL via INTRAVENOUS

## 2014-05-09 MED ORDER — HEPARIN SODIUM (PORCINE) 5000 UNIT/ML IJ SOLN
5000.0000 [IU] | Freq: Three times a day (TID) | INTRAMUSCULAR | Status: DC
  Administered 2014-05-09: 5000 [IU] via SUBCUTANEOUS
  Filled 2014-05-09 (×4): qty 1

## 2014-05-09 MED ORDER — HEPARIN (PORCINE) IN NACL 100-0.45 UNIT/ML-% IJ SOLN
1700.0000 [IU]/h | INTRAMUSCULAR | Status: DC
  Administered 2014-05-09: 1400 [IU]/h via INTRAVENOUS
  Administered 2014-05-10: 1700 [IU]/h via INTRAVENOUS
  Filled 2014-05-09 (×6): qty 250

## 2014-05-09 MED ORDER — INSULIN ASPART 100 UNIT/ML ~~LOC~~ SOLN
0.0000 [IU] | Freq: Three times a day (TID) | SUBCUTANEOUS | Status: DC
  Administered 2014-05-10 – 2014-05-14 (×9): 1 [IU] via SUBCUTANEOUS
  Administered 2014-05-14 – 2014-05-15 (×2): 2 [IU] via SUBCUTANEOUS
  Administered 2014-05-15 – 2014-05-16 (×2): 1 [IU] via SUBCUTANEOUS
  Administered 2014-05-16: 2 [IU] via SUBCUTANEOUS

## 2014-05-09 MED ORDER — CARVEDILOL 12.5 MG PO TABS
12.5000 mg | ORAL_TABLET | Freq: Two times a day (BID) | ORAL | Status: DC
  Administered 2014-05-10: 12.5 mg via ORAL
  Filled 2014-05-09 (×3): qty 1

## 2014-05-09 MED ORDER — LISINOPRIL 2.5 MG PO TABS
2.5000 mg | ORAL_TABLET | Freq: Every day | ORAL | Status: DC
  Administered 2014-05-09 – 2014-05-16 (×8): 2.5 mg via ORAL
  Filled 2014-05-09 (×9): qty 1

## 2014-05-09 MED ORDER — FUROSEMIDE 10 MG/ML IJ SOLN
INTRAMUSCULAR | Status: AC
  Filled 2014-05-09: qty 4

## 2014-05-09 MED ORDER — DILTIAZEM HCL 60 MG PO TABS
60.0000 mg | ORAL_TABLET | Freq: Four times a day (QID) | ORAL | Status: AC
  Administered 2014-05-09 (×3): 60 mg via ORAL
  Filled 2014-05-09 (×3): qty 1

## 2014-05-09 MED ORDER — FUROSEMIDE 20 MG PO TABS
20.0000 mg | ORAL_TABLET | Freq: Two times a day (BID) | ORAL | Status: DC
  Administered 2014-05-10: 20 mg via ORAL
  Filled 2014-05-09 (×3): qty 1

## 2014-05-09 NOTE — Progress Notes (Addendum)
[facility] PROGRESS NOTE  [name] VVZ:[id number] DOB: 12/15/1950 DOA: 05/08/2014 PCP: No PCP Per Patient  Assessment/Plan: Principal Problem:   Acute systolic heart failure: markedly depressed ejection fraction. Suspect patient will likely need cardiac catheterization, but will defer to cardiology. Have started Coreg and ACE inhibitor.  Active Problems:    Dyspnea: Secondary to heart failure and atrial fibrillation. Result. Off of oxygen   Atrial fibrillation with RVR: Rate controlled on Cardizem drip. Have started by mouth Cardizem and we'll change this over to Coreg and work on weaning off drip.  Obesity: Patient meets criteria With BMI greater than 30.  Ascites: Initially seen on abdominal ultrasound and confirmed with CT. Unclear etiology but likely in the setting of heart failure, this may be cause. Liver looked okay by a noncontrast CT. Transaminases appear relatively stable. Would not workup further at this time, unless it worsens     Depression: Previous history. Patient does not seem depressed at this time   Type 2 diabetes, uncontrolled, with renal manifestation: Diagnosis. A1c of 9.0. we'll start sliding scale and likely outpatient metformin   CKD (chronic kidney disease), stage III: Likely from underlying hypertension and diabetes. No previous baseline. Have started low-dose ACE inhibitor. Monitor renal function  Code Status: full code  Family Communication: no family present.  Disposition Plan: transfer to floor once Cardizem drip discontinued. Long-term patient will need primary care physician and cardiologist as outpatient.   Consultants:  cardiology  Procedures:  Echocardiogram: Difficult study given atrial fibrillation. Markedly decreased ejection fraction of 25-30%  Antibiotics:  none  HPI/Subjective: Patient doing better. Breathing is easier.  Objective: Filed Vitals:   05/09/14 1823  BP: 129/83  Pulse:   Temp:   Resp:      Intake/Output Summary (Last 24 hours) at 05/09/14 1920 Last data filed at 05/09/14 1800  Gross per 24 hour  Intake 600.58 ml  Output   3075 ml  Net -2474.42 ml   Filed Weights   05/08/14 2017 05/09/14 0500  Weight: 115.667 kg (255 lb) 117.5 kg (259 lb 0.7 oz)    Exam:   General: alert and oriented x3, no acute distress  Cardiovascular: irregular rhythm, borderline tachycardia  Respiratory: decreased breath sounds bibasilar  Abdomen: soft, obese, nontender, positive bowel sounds  Musculoskeletal: nuclear cyanosis, trace pitting edema bilaterally   Data Reviewed: Basic Metabolic Panel:  Recent Labs Lab 05/08/14 2040 05/09/14 0125 05/09/14 0311  NA 139  --  134*  K 4.7  --  4.2  CL 101  --  96  CO2 22  --  22  GLUCOSE 162*  --  127*  BUN 40*  --  37*  CREATININE 1.95* 1.61* 1.66*  CALCIUM 8.8  --  8.8   Liver Function Tests:  Recent Labs Lab 05/09/14 0311  AST 36  ALT 42  ALKPHOS 29*  BILITOT 0.6  PROT 6.4  ALBUMIN 3.4*   No results found for this basename: LIPASE, AMYLASE,  in the last 168 hours No results found for this basename: AMMONIA,  in the last 168 hours CBC:  Recent Labs Lab 05/08/14 2040 05/09/14 0125 05/09/14 0311  WBC 10.0 9.8 9.3  NEUTROABS  --   --  6.5  HGB 13.5 13.1 12.9*  HCT 41.8 39.2 39.4  MCV 89.1 87.5 87.9  PLT 175 159 153   Cardiac Enzymes:  Recent Labs Lab 05/08/14 2240 05/09/14 0125 05/09/14 1350  TROPONINI <0.30 <0.30 <0.30   BNP (last 3 results)  Recent  Labs  05/08/14 2042  PROBNP 3681.0*   CBG: No results found for this basename: GLUCAP,  in the last 168 hours  Recent Results (from the past 240 hour(s))  MRSA PCR SCREENING     Status: None   Collection Time    05/09/14  2:05 AM      Result Value Ref Range Status   MRSA by PCR NEGATIVE  NEGATIVE Final   Comment:            The GeneXpert MRSA Assay (FDA     approved for NASAL specimens     only), is one component of a     comprehensive MRSA  colonization     surveillance program. It is not     intended to diagnose MRSA     infection nor to guide or     monitor treatment for     MRSA infections.     Studies: Ct Abdomen Wo Contrast  05/09/2014   CLINICAL DATA:  Left-sided abdominal pain.  EXAM: CT ABDOMEN WITHOUT CONTRAST  TECHNIQUE: Multidetector CT imaging of the abdomen was performed following the standard protocol without IV contrast.  COMPARISON:  None.  FINDINGS: Images through the lung bases show tiny right pleural effusion. Mild ascites visualized in the abdomen and diffuse body wall edema is also seen within the subcutaneous tissues.  Noncontrast images of the liver, pancreas, spleen, adrenal glands, and kidneys are unremarkable. No evidence hydronephrosis. No abdominal soft tissue masses or lymphadenopathy identified. No evidence of focal inflammatory process. The visualized intra-abdominal bowel loops are nondilated.  IMPRESSION: Mild abdominal ascites and diffuse abdominal wall edema. Tiny right pleural effusion also noted.  No focal soft tissue mass or inflammatory process identified.   Electronically Signed   By: Myles RosenthalJohn  Stahl M.D.   On: 05/09/2014 11:33   Dg Chest 2 View  05/08/2014   CLINICAL DATA:  Leg swelling and numbness.  EXAM: CHEST  2 VIEW  COMPARISON:  None.  FINDINGS: Mild to moderate cardiomegaly. No concerning upper mediastinal contour change. Mild hypoaeration with interstitial crowding at the bases. No consolidation, edema, effusion, or pneumothorax.  IMPRESSION: Cardiomegaly without failure.   Electronically Signed   By: Tiburcio PeaJonathan  Watts M.D.   On: 05/08/2014 23:19   [place] Renal  05/09/2014   CLINICAL DATA:  Renal insufficiency  EXAM: RETROPERITONEAL ULTRASOUND  COMPARISON:  None.  FINDINGS: Right Kidney:  Length: 11.4 cm. Echogenicity and renal cortical thickness within normal limits. No mass or hydronephrosis visualized. There is no perinephric fluid. There is no sonographically demonstrable calculus or  ureterectasis.  Left Kidney:  Length: 12.3 cm. Echogenicity and renal cortical thickness within normal limits. No mass or hydronephrosis visualized. There is no perinephric fluid. There is no sonographically demonstrable calculus or ureterectasis.  Bladder:  Appears normal for degree of bladder distention.  There is moderate generalized ascites.  IMPRESSION: Moderate generalized ascites.  Study otherwise unremarkable.   Electronically Signed   By: Bretta BangWilliam  Woodruff M.D.   On: 05/09/2014 08:57    Scheduled Meds: . diltiazem  60 mg Oral QID  . sodium chloride  3 mL Intravenous Q12H  . sodium chloride  3 mL Intravenous Q12H   Continuous Infusions: . diltiazem (CARDIZEM) infusion 5 mg/hr (05/09/14 1600)  . heparin 1,400 Units/hr (05/09/14 1135)    Principal Problem:   Acute systolic heart failure Active Problems:   Dyspnea   Atrial fibrillation with RVR   AKI (acute kidney injury)   Swelling of lower extremity  Depression   Type 2 diabetes, uncontrolled, with renal manifestation   CKD (chronic kidney disease), stage III    Time spent: 35 minutes   Hollice Espy  [facility] Pager [phone]. If 7PM-7AM, please contact night-coverage at [email or web address], password [facility] 05/09/2014, 7:20 PM  LOS: 1 day

## 2014-05-09 NOTE — H&P (Addendum)
Hospitalist Admission History and Physical  Patient name: [name] Medical record number: [id number] Date of birth: 1951/01/24 Age: 63 y.o. Gender: male  Primary Care Provider: No PCP Per Patient  Chief Complaint: dyspnea, new onset afib w/ RVR, AKI, LE swelling  History of Present Illness:This is a [age] y.o. year old male with no known prior medical history presenting with dyspnea, new onset afib w/ rvr, AKI, LE swelling. Pt has not seen a doctor in > 10 years. States that he has had progressive LE swelling and dyspnea over the past 2 weeks. Denies increased salt intake or NSAID use. Denies drug/ETOH abuse. Has had to sleep with pillows. Worsening swelling in LEs. No CP, nausea. Mild orthopnea.  Presented to ER today in Afib 2/ RVR w/ HR in 110s. Otherwise hemodynamically stable. Started on a dilt drip. Pro BNP 3700. Cr 1.95, BUN 40. CXR cardiomegaly w/ failure.   Assessment and Plan: [name] is a 63 y.o. year old male presenting with dyspnea, new onset afib w/ RVR, AKI, LE swelling   Dyspnea: multifactorial. likely secondary to new onset afib w/ rvr w/ CHF. Noted cardiomegaly on CXR and elevated pro BNP. Continue dilt drip. Risk stratification labs. Cycle CEs. Cards consult pending.  Mild LLQ abd pain on exam as well.   AKI: Suspect prerenal etiology in setting of above. BUN/Cr ration around 20:1. Check renal u/s. Clinically hypervolemic. Consider gentle diuresis pending UA. FeNA pending.   FEN/GI: heart healthy diet.  Prophylaxis: sub q heparin  Disposition: pending further evaluation Code Status:Full Code    Patient Active Problem List   Diagnosis Date Noted  . Dyspnea 05/09/2014  . Atrial fibrillation with RVR 05/09/2014  . AKI (acute kidney injury) 05/09/2014  . Swelling of lower extremity 05/09/2014   Past Medical History: History reviewed. No pertinent past medical history.  Past Surgical History: Past Surgical History  Procedure Laterality Date  .  Vasectomy      Social History: History   Social History  . Marital Status: Divorced    Spouse Name: N/A    Number of Children: N/A  . Years of Education: N/A   Social History Main Topics  . Smoking status: Former Games developermoker  . Smokeless tobacco: None  . Alcohol Use: No  . Drug Use: No  . Sexual Activity: None   Other Topics Concern  . None   Social History Narrative  . None    Family History: History reviewed. No pertinent family history.  Allergies: No Known Allergies  Current Facility-Administered Medications  Medication Dose Route Frequency Provider Last Rate Last Dose  . 0.9 %  sodium chloride infusion  250 mL Intravenous PRN Doree AlbeeSteven Denna Fryberger, MD      . diltiazem (CARDIZEM) 100 mg in dextrose 5 % 100 mL infusion  5 mg/hr Intravenous Continuous Rolland PorterMark James, MD   5 mg/hr at 05/08/14 2245  . heparin injection 5,000 Units  5,000 Units Subcutaneous 3 times per day Doree AlbeeSteven Nini Cavan, MD      . sodium chloride 0.9 % injection 3 mL  3 mL Intravenous Q12H Doree AlbeeSteven Tyeisha Dinan, MD      . sodium chloride 0.9 % injection 3 mL  3 mL Intravenous Q12H Doree AlbeeSteven Holley Wirt, MD      . sodium chloride 0.9 % injection 3 mL  3 mL Intravenous PRN Doree AlbeeSteven Izek Corvino, MD       No current outpatient prescriptions on file.   Review Of Systems: 12 point ROS negative except as noted above in  HPI.  Physical Exam: Filed Vitals:   05/09/14 0000  BP: 134/95  Pulse: 33  Temp:   Resp: 16    General: alert, cooperative and morbidly obese HEENT: PERRLA and extra ocular movement intact Heart: S1, S2 normal, no murmur, rub or gallop, regular rate and rhythm Lungs: clear to auscultation, no wheezes or rales and unlabored breathing Abdomen: obese abdomen, + bowel sounds, + mild LLQ TTP  Extremities: 2+ peripheral pulses, 2-3+ pitting edema bilaterally  Skin:no rashes, no ecchymoses Neurology: normal without focal findings  Labs and Imaging: Lab Results  Component Value Date/Time   NA 139 05/08/2014  8:40 PM   K 4.7  05/08/2014  8:40 PM   CL 101 05/08/2014  8:40 PM   CO2 22 05/08/2014  8:40 PM   BUN 40* 05/08/2014  8:40 PM   CREATININE 1.95* 05/08/2014  8:40 PM   GLUCOSE 162* 05/08/2014  8:40 PM   Lab Results  Component Value Date   WBC 10.0 05/08/2014   HGB 13.5 05/08/2014   HCT 41.8 05/08/2014   MCV 89.1 05/08/2014   PLT 175 05/08/2014    Dg Chest 2 View  05/08/2014   CLINICAL DATA:  Leg swelling and numbness.  EXAM: CHEST  2 VIEW  COMPARISON:  None.  FINDINGS: Mild to moderate cardiomegaly. No concerning upper mediastinal contour change. Mild hypoaeration with interstitial crowding at the bases. No consolidation, edema, effusion, or pneumothorax.  IMPRESSION: Cardiomegaly without failure.   Electronically Signed   By: Tiburcio Pea M.D.   On: 05/08/2014 23:19           Doree Albee MD  Pager: [phone]

## 2014-05-09 NOTE — Progress Notes (Signed)
Report called to Marcelino Duster, RN on 2C. Preparing to transfer patient per order.

## 2014-05-09 NOTE — Progress Notes (Signed)
ANTICOAGULATION CONSULT NOTE - Initial Consult  Pharmacy Consult for heparin Indication: atrial fibrillation  No Known Allergies  Patient Measurements: Height: 5\' 8"  (172.7 cm) Weight: 259 lb 0.7 oz (117.5 kg) IBW/kg (Calculated) : 68.4 Heparin Dosing Weight:96kg  Vital Signs: Temp: 98 F (36.7 C) (07/01 0700) Temp src: Axillary (07/01 0700) BP: 136/84 mmHg (07/01 0700) Pulse Rate: 90 (07/01 0700)  Labs:  Recent Labs  05/08/14 2040 05/08/14 2240 05/09/14 0125 05/09/14 0311  HGB 13.5  --  13.1 12.9*  HCT 41.8  --  39.2 39.4  PLT 175  --  159 153  CREATININE 1.95*  --  1.61* 1.66*  TROPONINI  --  <0.30 <0.30  --     Estimated Creatinine Clearance: 56.7 ml/min (by C-G formula based on Cr of 1.66).   Medical History: History reviewed. No pertinent past medical history.  Assessment: 63 year old male with new onset afib. Work up underway that would determine chads2vasc score. New orders to start IV heparin, rec'd sq this am.  Goal of Therapy:  Heparin level 0.3-0.7 units/ml Monitor platelets by anticoagulation protocol: Yes   Plan:  Give 4000 units bolus x 1 Start heparin infusion at 1400 units/hr Check anti-Xa level in 6 hours and daily while on heparin Continue to monitor H&H and platelets  Sheppard Coil PharmD., BCPS Clinical Pharmacist Pager [phone] 05/09/2014 9:56 AM

## 2014-05-09 NOTE — Progress Notes (Signed)
Patient feels much better.  He has diuresed > 2L.  Await echo result.  He has HTN.  May need long term anticoagulation but f/u could be an issue.  H/o psych issues and no medical f/u since [facility] closed a few years ago.  Will follow.

## 2014-05-09 NOTE — Progress Notes (Signed)
ANTICOAGULATION CONSULT NOTE - Follow Up Consult  Pharmacy Consult for Heparin Indication: atrial fibrillation  No Known Allergies  Patient Measurements: Height: 5\' 8"  (172.7 cm) Weight: 259 lb 0.7 oz (117.5 kg) IBW/kg (Calculated) : 68.4 Heparin Dosing Weight: 96 kg  Vital Signs: Temp: 97.9 F (36.6 C) (07/01 1600) Temp src: Oral (07/01 1600) BP: 129/83 mmHg (07/01 1823) Pulse Rate: 93 (07/01 1200)  Labs:  Recent Labs  05/08/14 2040 05/08/14 2240 05/09/14 0125 05/09/14 0311 05/09/14 1350 05/09/14 1845  HGB 13.5  --  13.1 12.9*  --   --   HCT 41.8  --  39.2 39.4  --   --   PLT 175  --  159 153  --   --   HEPARINUNFRC  --   --   --   --   --  0.23*  CREATININE 1.95*  --  1.61* 1.66*  --   --   TROPONINI  --  <0.30 <0.30  --  <0.30  --     Estimated Creatinine Clearance: 56.7 ml/min (by C-G formula based on Cr of 1.66).   Assessment: new onset afib, HF  Anticoagulation:heparin for afib. Heparin level 0.23 less than goal.  Goal of Therapy:  Heparin level 0.3-0.7 units/ml Monitor platelets by anticoagulation protocol: Yes   Plan:  Increase IV heparin to 1700 units/hr. Daily heparin level and CBC  Raheen Capili S. Merilynn Finland, PharmD, BCPS Clinical Staff Pharmacist Pager [phone]  Misty Stanley Stillinger 05/09/2014,7:49 PM

## 2014-05-09 NOTE — Consult Note (Signed)
Reason for Consult: Leg Swelling Referring Physician: Dr. Durwin Nora is an 63 y.o. male.  HPI: [name] is a 63 yo man with no real PMH who presents with lower leg swelling and itching for the last two months. He is particularly bothered by the itching from his legs with some asociated paresthesias. On further questioning he also endorses waking up dyspneic at times.  He tells me the symptoms/swelling may have been gradually progressive over the last 4-6 months with worsening over the last two months. He was ill in March and hasn't felt the same since. He had a few days he didn't even fill like getting out of bed - general malaise after a viral illness/cold. No known chest pain or syncope. No known sick contacts. He currently lives in a duplex that his family owns and rents out/manages the other part.      History reviewed. No pertinent past medical history.  Past Surgical History  Procedure Laterality Date  . Vasectomy      History reviewed. No pertinent family history.  Social History:  reports that he has quit smoking. He does not have any smokeless tobacco history on file. He reports that he does not drink alcohol or use illicit drugs.  Allergies: No Known Allergies  Medications:  I have reviewed the patient's current medications. Prior to Admission:  (Not in a hospital admission) Scheduled: . heparin  5,000 Units Subcutaneous 3 times per day  . sodium chloride  3 mL Intravenous Q12H  . sodium chloride  3 mL Intravenous Q12H   Continuous: . sodium chloride    . diltiazem (CARDIZEM) infusion Stopped (05/08/14 2249)    Results for orders placed during the hospital encounter of 05/08/14 (from the past 48 hour(s))  CBC     Status: None   Collection Time    05/08/14  8:40 PM      Result Value Ref Range   WBC 10.0  4.0 - 10.5 K/uL   RBC 4.69  4.22 - 5.81 MIL/uL   Hemoglobin 13.5  13.0 - 17.0 g/dL   HCT 41.8  39.0 - 52.0 %   MCV 89.1  78.0 - 100.0 fL   MCH 28.8  26.0 - 34.0 pg   MCHC 32.3  30.0 - 36.0 g/dL   RDW 14.4  11.5 - 15.5 %   Platelets 175  150 - 400 K/uL  BASIC METABOLIC PANEL     Status: Abnormal   Collection Time    05/08/14  8:40 PM      Result Value Ref Range   Sodium 139  137 - 147 mEq/L   Potassium 4.7  3.7 - 5.3 mEq/L   Chloride 101  96 - 112 mEq/L   CO2 22  19 - 32 mEq/L   Glucose, Bld 162 (*) 70 - 99 mg/dL   BUN 40 (*) 6 - 23 mg/dL   Creatinine, Ser 1.95 (*) 0.50 - 1.35 mg/dL   Calcium 8.8  8.4 - 10.5 mg/dL   GFR calc non Af Amer 35 (*) >90 mL/min   GFR calc Af Amer 40 (*) >90 mL/min   Comment: (NOTE)     The eGFR has been calculated using the CKD EPI equation.     This calculation has not been validated in all clinical situations.     eGFR's persistently <90 mL/min signify possible Chronic Kidney     Disease.  PRO B NATRIURETIC PEPTIDE     Status: Abnormal  Collection Time    05/08/14  8:42 PM      Result Value Ref Range   Pro B Natriuretic peptide (BNP) 3681.0 (*) 0 - 125 pg/mL  TROPONIN I     Status: None   Collection Time    05/08/14 10:40 PM      Result Value Ref Range   Troponin I <0.30  <0.30 ng/mL   Comment:            Due to the release kinetics of cTnI,     a negative result within the first hours     of the onset of symptoms does not rule out     myocardial infarction with certainty.     If myocardial infarction is still suspected,     repeat the test at appropriate intervals.    Dg Chest 2 View  05/08/2014   CLINICAL DATA:  Leg swelling and numbness.  EXAM: CHEST  2 VIEW  COMPARISON:  None.  FINDINGS: Mild to moderate cardiomegaly. No concerning upper mediastinal contour change. Mild hypoaeration with interstitial crowding at the bases. No consolidation, edema, effusion, or pneumothorax.  IMPRESSION: Cardiomegaly without failure.   Electronically Signed   By: Jorje Guild M.D.   On: 05/08/2014 23:19    Review of Systems  Constitutional: Positive for malaise/fatigue. Negative for fever  and chills.  HENT: Negative for ear discharge.   Eyes: Negative for double vision and photophobia.  Respiratory: Positive for shortness of breath. Negative for cough and hemoptysis.   Cardiovascular: Positive for orthopnea and leg swelling. Negative for chest pain and palpitations.  Gastrointestinal: Negative for nausea, vomiting and abdominal pain.  Genitourinary: Positive for dysuria. Negative for urgency.  Musculoskeletal: Negative for myalgias and neck pain.  Skin: Positive for itching and rash.  Neurological: Negative for tingling, tremors, sensory change and headaches.  Endo/Heme/Allergies: Negative for polydipsia.  Psychiatric/Behavioral: Negative for suicidal ideas, hallucinations and substance abuse.   Blood pressure 134/95, pulse 33, temperature 98.9 F (37.2 C), temperature source Oral, resp. rate 16, height '5\' 8"'  (1.727 m), weight 115.667 kg (255 lb), SpO2 94.00%. Physical Exam  Nursing note and vitals reviewed. Constitutional: He is oriented to person, place, and time. He appears well-developed and well-nourished. No distress.  HENT:  Head: Normocephalic and atraumatic.  Nose: Nose normal.  Mouth/Throat: Oropharynx is clear and moist. No oropharyngeal exudate.  Eyes: Conjunctivae and EOM are normal. Pupils are equal, round, and reactive to light.  Neck: Normal range of motion. Neck supple. JVD present. No thyromegaly present.  Cardiovascular: Exam reveals no gallop.   No murmur heard. Tachycardic, irregular irregular  Respiratory: Effort normal. No respiratory distress. He has no wheezes. He has rales.  GI: Soft. Bowel sounds are normal. He exhibits no distension. There is no tenderness. There is no rebound.  Musculoskeletal: Normal range of motion. He exhibits edema. He exhibits no tenderness.  Neurological: He is alert and oriented to person, place, and time. No cranial nerve deficit. Coordination normal.  Skin: Skin is warm and dry. Rash noted. He is not diaphoretic.  There is erythema.  Psychiatric: He has a normal mood and affect. His behavior is normal. Thought content normal.   Labs reviewed; Cr 1.95, K 4.7, plt 175, h/h 13.5/41 ProBNP 3681 Trop <0.3 Chest x-ray: cardiomegaly, increase cephalization  Problem List Dyspnea Lower extremity Edema New onset heart failure with BNP 3600+ Renal failure - acute vs. Chronic renal insufficiency or both Atrial fibrillation + RVR  Assessment/Plan: 63 yo man with  no known PMH who presents with dyspnea and leg swelling and found to have elevated BNP and atrial fibrillation with RVR and renal failure. Differential diagnosis is broad but there is potentially multifactorial etiologies: atrial fibrillation, heart failure (unknown systolic or diastolic or both) and renal failure.  Given his lower extremity edema and rash vs. Chronic skin change would also evaluate for rheumatologic and inflammatory conditions such as leukocytoclastic vasculitis, RA, HIV, hepatitis B/C given his age. Potential myocarditis earlier in the year as well given story but could be red herring. Agree with gentle rate control with diltiazem, start heparin gtt, obtain echocardiogram in AM, gentle diuresis and consider TEE-DCCV at some point based on further findings.  - telemetry, diltiazem gtt for gentle rate control - echocardiogram in AM - pharmacy for heparin gtt for atrial fibrillation and CHADS2VASC of 2+ (HTN, CHF, ? T2DM) - obtain TSH, hba1c, magnesium, lipid panel - would screen for hepatitis B/C/HIV serologies, RA markers ESR/CRP, complement levels, RF, serum cryoglobulins, ANCA, ANA, anti-dsDNA, SPEP/UPEP - urinalysis  - lasix 40 mg IV written for by me with JVP just under mandible - consider renal ultrasound - don't think pursuing TEE-DCCV until further investigation of HF but may consider later this week  [name] 05/09/2014, 1:15 AM

## 2014-05-09 NOTE — Progress Notes (Signed)
  Echocardiogram 2D Echocardiogram has been performed.  Cathie Beams 05/09/2014, 10:49 AM

## 2014-05-10 DIAGNOSIS — N189 Chronic kidney disease, unspecified: Secondary | ICD-10-CM

## 2014-05-10 DIAGNOSIS — E669 Obesity, unspecified: Secondary | ICD-10-CM

## 2014-05-10 DIAGNOSIS — I5021 Acute systolic (congestive) heart failure: Secondary | ICD-10-CM

## 2014-05-10 DIAGNOSIS — I4891 Unspecified atrial fibrillation: Secondary | ICD-10-CM

## 2014-05-10 DIAGNOSIS — I509 Heart failure, unspecified: Secondary | ICD-10-CM

## 2014-05-10 LAB — HEPARIN LEVEL (UNFRACTIONATED)
HEPARIN UNFRACTIONATED: 0.8 [IU]/mL — AB (ref 0.30–0.70)
Heparin Unfractionated: 2.16 IU/mL — ABNORMAL HIGH (ref 0.30–0.70)
Heparin Unfractionated: 0.85 IU/mL — ABNORMAL HIGH (ref 0.30–0.70)

## 2014-05-10 LAB — COMPREHENSIVE METABOLIC PANEL
ALT: 41 U/L (ref 0–53)
AST: 38 U/L — AB (ref 0–37)
Albumin: 3 g/dL — ABNORMAL LOW (ref 3.5–5.2)
Alkaline Phosphatase: 27 U/L — ABNORMAL LOW (ref 39–117)
Anion gap: 14 (ref 5–15)
BILIRUBIN TOTAL: 0.5 mg/dL (ref 0.3–1.2)
BUN: 31 mg/dL — ABNORMAL HIGH (ref 6–23)
CALCIUM: 8.1 mg/dL — AB (ref 8.4–10.5)
CHLORIDE: 98 meq/L (ref 96–112)
CO2: 24 mEq/L (ref 19–32)
Creatinine, Ser: 1.43 mg/dL — ABNORMAL HIGH (ref 0.50–1.35)
GFR calc Af Amer: 59 mL/min — ABNORMAL LOW (ref >90)
GFR calc non Af Amer: 51 mL/min — ABNORMAL LOW (ref >90)
Glucose, Bld: 134 mg/dL — ABNORMAL HIGH (ref 70–99)
Potassium: 3.3 mEq/L — ABNORMAL LOW (ref 3.7–5.3)
SODIUM: 136 meq/L — AB (ref 137–147)
Total Protein: 6 g/dL (ref 6.0–8.3)

## 2014-05-10 LAB — GLUCOSE, CAPILLARY
GLUCOSE-CAPILLARY: 136 mg/dL — AB (ref 70–99)
GLUCOSE-CAPILLARY: 135 mg/dL — AB (ref 70–99)
GLUCOSE-CAPILLARY: 120 mg/dL — AB (ref 70–99)
Glucose-Capillary: 138 mg/dL — ABNORMAL HIGH (ref 70–99)

## 2014-05-10 LAB — CBC WITH DIFFERENTIAL/PLATELET
Basophils Absolute: 0 10*3/uL (ref 0.0–0.1)
Basophils Relative: 0 % (ref 0–1)
EOS PCT: 3 % (ref 0–5)
Eosinophils Absolute: 0.3 10*3/uL (ref 0.0–0.7)
HCT: 37.2 % — ABNORMAL LOW (ref 39.0–52.0)
Hemoglobin: 12.2 g/dL — ABNORMAL LOW (ref 13.0–17.0)
LYMPHS ABS: 1.4 10*3/uL (ref 0.7–4.0)
Lymphocytes Relative: 16 % (ref 12–46)
MCH: 29 pg (ref 26.0–34.0)
MCHC: 32.8 g/dL (ref 30.0–36.0)
MCV: 88.4 fL (ref 78.0–100.0)
MONO ABS: 0.8 10*3/uL (ref 0.1–1.0)
Monocytes Relative: 9 % (ref 3–12)
Neutro Abs: 6.4 10*3/uL (ref 1.7–7.7)
Neutrophils Relative %: 72 % (ref 43–77)
Platelets: 151 10*3/uL (ref 150–400)
RBC: 4.21 MIL/uL — AB (ref 4.22–5.81)
RDW: 14.5 % (ref 11.5–15.5)
WBC: 8.8 10*3/uL (ref 4.0–10.5)

## 2014-05-10 LAB — PROTIME-INR
INR: 1.14 (ref 0.00–1.49)
Prothrombin Time: 14.6 seconds (ref 11.6–15.2)

## 2014-05-10 MED ORDER — WARFARIN - PHARMACIST DOSING INPATIENT
Freq: Every day | Status: DC
  Administered 2014-05-10 – 2014-05-15 (×2)

## 2014-05-10 MED ORDER — FUROSEMIDE 10 MG/ML IJ SOLN
80.0000 mg | Freq: Once | INTRAMUSCULAR | Status: AC
  Administered 2014-05-10: 80 mg via INTRAVENOUS
  Filled 2014-05-10: qty 8

## 2014-05-10 MED ORDER — LIVING WELL WITH DIABETES BOOK
Freq: Once | Status: AC
  Administered 2014-05-10: 09:00:00
  Filled 2014-05-10: qty 1

## 2014-05-10 MED ORDER — HEPARIN (PORCINE) IN NACL 100-0.45 UNIT/ML-% IJ SOLN
1500.0000 [IU]/h | INTRAMUSCULAR | Status: DC
  Administered 2014-05-10: 1350 [IU]/h via INTRAVENOUS
  Administered 2014-05-10: 1550 [IU]/h via INTRAVENOUS
  Administered 2014-05-10 – 2014-05-14 (×5): 1350 [IU]/h via INTRAVENOUS
  Administered 2014-05-14 – 2014-05-15 (×3): 1500 [IU]/h via INTRAVENOUS
  Filled 2014-05-10 (×12): qty 250

## 2014-05-10 MED ORDER — LIVING BETTER WITH HEART FAILURE BOOK
Freq: Once | Status: AC
  Administered 2014-05-10: 09:00:00
  Filled 2014-05-10: qty 1

## 2014-05-10 MED ORDER — METOPROLOL TARTRATE 25 MG PO TABS
25.0000 mg | ORAL_TABLET | Freq: Four times a day (QID) | ORAL | Status: DC
  Administered 2014-05-10 – 2014-05-11 (×4): 25 mg via ORAL
  Filled 2014-05-10 (×8): qty 1

## 2014-05-10 MED ORDER — DIPHENHYDRAMINE HCL 25 MG PO CAPS
25.0000 mg | ORAL_CAPSULE | Freq: Four times a day (QID) | ORAL | Status: DC | PRN
  Administered 2014-05-10 – 2014-05-14 (×6): 25 mg via ORAL
  Filled 2014-05-10 (×6): qty 1

## 2014-05-10 MED ORDER — WARFARIN SODIUM 7.5 MG PO TABS
7.5000 mg | ORAL_TABLET | Freq: Once | ORAL | Status: AC
  Administered 2014-05-10: 7.5 mg via ORAL
  Filled 2014-05-10: qty 1

## 2014-05-10 MED ORDER — WARFARIN VIDEO
Freq: Once | Status: AC
  Administered 2014-05-10: 11:00:00

## 2014-05-10 MED ORDER — COUMADIN BOOK
Freq: Once | Status: AC
  Administered 2014-05-10: 11:00:00
  Filled 2014-05-10: qty 1

## 2014-05-10 NOTE — Progress Notes (Signed)
Nutrition Consult/Brief Note  RD consulted for assessment of nutrition requirement/status.  Wt Readings from Last 15 Encounters:  05/09/14 259 lb 0.7 oz (117.5 kg)    Body mass index is 39.4 kg/(m^2). Patient meets criteria for Obesity Class II based on current BMI.   Current diet order is Heart Healthy, patient is consuming approximately 100% of meals at this time. Labs and medications reviewed.  RD offered DM diet education handouts, however, pt declined.   No further nutrition interventions warranted at this time. If nutrition issues arise, please consult RD.   Received telephone order with readback from Dr. Rito Ehrlich to change diet order to Heart Healthy/Carbohydrate Modified.  Maureen Chatters, RD, LDN Pager #: [phone] After-Hours Pager #: [phone]

## 2014-05-10 NOTE — Progress Notes (Addendum)
Subjective: Breathing some better  No CP  Objective: Filed Vitals:   05/09/14 2105 05/09/14 2204 05/10/14 0000 05/10/14 0400  BP: 142/94 142/90 101/61 126/71  Pulse:  90 92 82  Temp:  98 F (36.7 C) 97.7 F (36.5 C) 97.9 F (36.6 C)  TempSrc:  Oral Oral Oral  Resp: 22 20 20 14   Height:      Weight:      SpO2:  96% 94% 94%   Weight change:   Intake/Output Summary (Last 24 hours) at 05/10/14 0747 Last data filed at 05/10/14 0054  Gross per 24 hour  Intake 714.26 ml  Output    800 ml  Net -85.74 ml   I/O  -3  L   General: Alert, awake, oriented x3, in no acute distress Neck:  JVP is increased   Heart:Irregular rate and rhythm, without murmurs, rubs, gallops.  Lungs: Clear to auscultation.  No rales or wheezes. Exemities:  1+ edema.   Neuro: Grossly intact, nonfocal.  Tele:  Afi  90s to 130 Lab Results: Results for orders placed during the hospital encounter of 05/08/14 (from the past 24 hour(s))  TROPONIN I     Status: None   Collection Time    05/09/14  1:50 PM      Result Value Ref Range   Troponin I <0.30  <0.30 ng/mL  HEPARIN LEVEL (UNFRACTIONATED)     Status: Abnormal   Collection Time    05/09/14  6:45 PM      Result Value Ref Range   Heparin Unfractionated 0.23 (*) 0.30 - 0.70 IU/mL  GLUCOSE, CAPILLARY     Status: Abnormal   Collection Time    05/09/14  9:11 PM      Result Value Ref Range   Glucose-Capillary 203 (*) 70 - 99 mg/dL  COMPREHENSIVE METABOLIC PANEL     Status: Abnormal   Collection Time    05/10/14  3:05 AM      Result Value Ref Range   Sodium 136 (*) 137 - 147 mEq/L   Potassium 3.3 (*) 3.7 - 5.3 mEq/L   Chloride 98  96 - 112 mEq/L   CO2 24  19 - 32 mEq/L   Glucose, Bld 134 (*) 70 - 99 mg/dL   BUN 31 (*) 6 - 23 mg/dL   Creatinine, Ser 1.611.43 (*) 0.50 - 1.35 mg/dL   Calcium 8.1 (*) 8.4 - 10.5 mg/dL   Total Protein 6.0  6.0 - 8.3 g/dL   Albumin 3.0 (*) 3.5 - 5.2 g/dL   AST 38 (*) 0 - 37 U/L   ALT 41  0 - 53 U/L   Alkaline Phosphatase  27 (*) 39 - 117 U/L   Total Bilirubin 0.5  0.3 - 1.2 mg/dL   GFR calc non Af Amer 51 (*) >90 mL/min   GFR calc Af Amer 59 (*) >90 mL/min   Anion gap 14  5 - 15  CBC WITH DIFFERENTIAL     Status: Abnormal   Collection Time    05/10/14  3:05 AM      Result Value Ref Range   WBC 8.8  4.0 - 10.5 K/uL   RBC 4.21 (*) 4.22 - 5.81 MIL/uL   Hemoglobin 12.2 (*) 13.0 - 17.0 g/dL   HCT 09.637.2 (*) 04.539.0 - 40.952.0 %   MCV 88.4  78.0 - 100.0 fL   MCH 29.0  26.0 - 34.0 pg   MCHC 32.8  30.0 - 36.0 g/dL   RDW 81.114.5  11.5 - 15.5 %   Platelets 151  150 - 400 K/uL   Neutrophils Relative % 72  43 - 77 %   Neutro Abs 6.4  1.7 - 7.7 K/uL   Lymphocytes Relative 16  12 - 46 %   Lymphs Abs 1.4  0.7 - 4.0 K/uL   Monocytes Relative 9  3 - 12 %   Monocytes Absolute 0.8  0.1 - 1.0 K/uL   Eosinophils Relative 3  0 - 5 %   Eosinophils Absolute 0.3  0.0 - 0.7 K/uL   Basophils Relative 0  0 - 1 %   Basophils Absolute 0.0  0.0 - 0.1 K/uL  HEPARIN LEVEL (UNFRACTIONATED)     Status: Abnormal   Collection Time    05/10/14  3:05 AM      Result Value Ref Range   Heparin Unfractionated 2.16 (*) 0.30 - 0.70 IU/mL  HEPARIN LEVEL (UNFRACTIONATED)     Status: Abnormal   Collection Time    05/10/14  6:55 AM      Result Value Ref Range   Heparin Unfractionated 0.85 (*) 0.30 - 0.70 IU/mL    Studies/Results: Ct Abdomen Wo Contrast  05/09/2014   CLINICAL DATA:  Left-sided abdominal pain.  EXAM: CT ABDOMEN WITHOUT CONTRAST  TECHNIQUE: Multidetector CT imaging of the abdomen was performed following the standard protocol without IV contrast.  COMPARISON:  None.  FINDINGS: Images through the lung bases show tiny right pleural effusion. Mild ascites visualized in the abdomen and diffuse body wall edema is also seen within the subcutaneous tissues.  Noncontrast images of the liver, pancreas, spleen, adrenal glands, and kidneys are unremarkable. No evidence hydronephrosis. No abdominal soft tissue masses or lymphadenopathy identified. No  evidence of focal inflammatory process. The visualized intra-abdominal bowel loops are nondilated.  IMPRESSION: Mild abdominal ascites and diffuse abdominal wall edema. Tiny right pleural effusion also noted.  No focal soft tissue mass or inflammatory process identified.   Electronically Signed   By: Myles Rosenthal M.D.   On: 05/09/2014 11:33   US Renal  05/09/2014   CLINICAL DATA:  Renal insufficiency  EXAM: RETROPERITONEAL ULTRASOUND  COMPARISON:  None.  FINDINGS: Right Kidney:  Length: 11.4 cm. Echogenicity and renal cortical thickness within normal limits. No mass or hydronephrosis visualized. There is no perinephric fluid. There is no sonographically demonstrable calculus or ureterectasis.  Left Kidney:  Length: 12.3 cm. Echogenicity and renal cortical thickness within normal limits. No mass or hydronephrosis visualized. There is no perinephric fluid. There is no sonographically demonstrable calculus or ureterectasis.  Bladder:  Appears normal for degree of bladder distention.  There is moderate generalized ascites.  IMPRESSION: Moderate generalized ascites.  Study otherwise unremarkable.   Electronically Signed   By: Bretta Bang M.D.   On: 05/09/2014 08:57    Medications: Reviewed-   Echo  05/09/14 Left ventricle: The cavity size was at the upper limits of normal. Wall thickness was increased in a pattern of mild to moderate LVH. Systolic function was severely reduced. The estimated ejection fraction was in the range of 25% to 30%. Diffuse hypokinesis. The study is not technically sufficient to allow evaluation of LV diastolic function - rhythm appears to be atrial fibrillation. - Mitral valve: Mildly calcified annulus. There was trivial regurgitation. - Left atrium: The atrium was severely dilated. - Right ventricle: The cavity size was mildly dilated. Systolic function was mildly to moderately reduced. - Right atrium: The atrium was moderately dilated. Central venous pressure (est): 15  mm Hg. - Atrial septum: No defect or patent foramen ovale was identified. - Tricuspid valve: There was mild regurgitation. - Pulmonary arteries: PA peak pressure: 48 mm Hg (S). - Pericardium, extracardiac: A trivial pericardial effusion was identified posterior to the heart.  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 Indeterminate diastolic function. Severe left atrial enlargement. MIld RV enlargement with reduced contraction. Mild tricuspid regurgitation with PASP 48 mmHg and elevated CVP. Trivial pericardial effusion.      @PROBHOS   1.  Acute systolic CHF Echo yesterday shows severe LV dysfunction.    Inferior wall does not appear to be moving as well as others.   I have discussed with patinet   May be tachy induced  May be idiopathic.  May be related to CAD  He denies CP  Does say he has felt more fatigued recently.  CT of abdomen yesterday shows no atherosclerosis of aorta. Discussed options of evaluation  Patient would prefer trial of rate and rhythm control as well as volume control first with f/u as outpatient  Will need echo in a few months to see if normalizes.  IF does not then will need cath (if rates good)he understands. Will diurese today  Try to add CHF meds I lean toward coumadin so that we cant track compliance.  He needs to take  Other agents cannot be documented    2.  AFib  Rates still high at tmes  On heparin.  Add oral agents.   3.  CKD  Stage II  Cr is improving  Contniue diuresis.       LOS: 2 days   Dietrich Pates 05/10/2014, 7:47 AM

## 2014-05-10 NOTE — Progress Notes (Signed)
Spoke with patient about the new diagnosis of diabetes. Admitted with heart failure, atrial fibrillation, ascites.  HgbA1C is 9%.  States that he has not seen a doctor in 10 years. No PCP at this time. Sister has diabetes.  Has problems with swelling in legs and feet with numerous bruises on legs.  Only transportation is a scooter that he came to the hospital on. Gets $936/month and $16 worth of food stamps. Very limited in what healthy food he can get financially. Will need help with diet planning.  Will need to be seen at [facility] as follow up since he does not have insurance. Case management to talk with patient.Can get ouptatient DM education at the clinic.  Living Well with Diabetes booklet ordered.  Staff RNs to teach patient how to check own CBGs, give DM [place] notes on survival skills.  Will continue to follow while in hospital.  Smith Mince RN BSN CDE

## 2014-05-10 NOTE — Progress Notes (Signed)
Pt asked to eat meal in his room before being transferred to [facility].

## 2014-05-10 NOTE — Progress Notes (Signed)
[facility] PROGRESS NOTE  [name] WUJ:[id number] DOB: Apr 03, 1951 DOA: 05/08/2014 PCP: No PCP Per Patient  Assessment/Plan: Principal Problem:   Acute systolic heart failure: markedly depressed ejection fraction.  Have started Coreg and ACE inhibitor plus Lasix. Has diuresed approximately 3 L since admission.  Plan is to recheck echocardiogram as outpatient in several months  Active Problems:    Dyspnea: Secondary to heart failure and atrial fibrillation. Resolved. Off of oxygen    Atrial fibrillation with RVR: Much improved. Weaned off of Cardizem drip. Change Cardizem to by mouth Coreg. Heart rate right at 100, prior to first dose of Coreg.  Started on Coumadin  Obesity: Patient meets criteria With BMI greater than 30.  Ascites: Initially seen on abdominal ultrasound and confirmed with CT. Unclear etiology but likely in the setting of heart failure, this may be cause. Liver looked okay by a noncontrast CT. Transaminases appear relatively stable. Would not workup further at this time, unless it worsens    Depression: Previous history. Patient seems in good spirits, no signs of active depression    Type 2 diabetes, uncontrolled, with renal manifestation: Diagnosis. A1c of 9.0. we'll start sliding scale and likely outpatient metformin.  Stable    CKD (chronic kidney disease), stage III: Likely from underlying hypertension and diabetes. No previous baseline. Started low-dose ACE inhibitor on 7/1.Marland Kitchen Monitoring renal function, creatinine actually decreased on 7/2  Code Status: full code  Family Communication: no family present.  Disposition Plan: Transfer to floor. Have made referral [facility]. Likely discharge in the next one to 2 days   Consultants:  cardiology  Procedures:  Echocardiogram: Difficult study given atrial fibrillation. Markedly decreased ejection fraction of 25-30%  Antibiotics:  none  HPI/Subjective: Patient  feeling okay. No shortness of breath. No chest pain.  Objective: Filed Vitals:   05/10/14 1209  BP: 126/87  Pulse: 91  Temp: 97.5 F (36.4 C)  Resp: 15    Intake/Output Summary (Last 24 hours) at 05/10/14 1346 Last data filed at 05/10/14 1210  Gross per 24 hour  Intake  583.6 ml  Output   1600 ml  Net -1016.4 ml   Filed Weights   05/08/14 2017 05/09/14 0500  Weight: 115.667 kg (255 lb) 117.5 kg (259 lb 0.7 oz)    Exam:   General: alert and oriented x3, no acute distress  Cardiovascular: irregular rhythm, better rate control  Respiratory: Clear to auscultation bilaterally  Abdomen: soft, obese, nontender, positive bowel sounds  Musculoskeletal: No clubbing or cyanosis cyanosis, trace pitting edema bilaterally   Data Reviewed: Basic Metabolic Panel:  Recent Labs Lab 05/08/14 2040 05/09/14 0125 05/09/14 0311 05/10/14 0305  NA 139  --  134* 136*  K 4.7  --  4.2 3.3*  CL 101  --  96 98  CO2 22  --  22 24  GLUCOSE 162*  --  127* 134*  BUN 40*  --  37* 31*  CREATININE 1.95* 1.61* 1.66* 1.43*  CALCIUM 8.8  --  8.8 8.1*   Liver Function Tests:  Recent Labs Lab 05/09/14 0311 05/10/14 0305  AST 36 38*  ALT 42 41  ALKPHOS 29* 27*  BILITOT 0.6 0.5  PROT 6.4 6.0  ALBUMIN 3.4* 3.0*   No results found for this basename: LIPASE, AMYLASE,  in the last 168 hours No results found for this basename: AMMONIA,  in the last 168 hours CBC:  Recent Labs Lab 05/08/14 2040 05/09/14 0125 05/09/14 0311 05/10/14 0305  WBC 10.0 9.8 9.3 8.8  NEUTROABS  --   --  6.5 6.4  HGB 13.5 13.1 12.9* 12.2*  HCT 41.8 39.2 39.4 37.2*  MCV 89.1 87.5 87.9 88.4  PLT 175 159 153 151   Cardiac Enzymes:  Recent Labs Lab 05/08/14 2240 05/09/14 0125 05/09/14 1350  TROPONINI <0.30 <0.30 <0.30   BNP (last 3 results)  Recent Labs  05/08/14 2042  PROBNP 3681.0*   CBG:  Recent Labs Lab 05/09/14 2111 05/10/14 0805  GLUCAP 203* 136*    Recent Results (from the past  240 hour(s))  MRSA PCR SCREENING     Status: None   Collection Time    05/09/14  2:05 AM      Result Value Ref Range Status   MRSA by PCR NEGATIVE  NEGATIVE Final   Comment:            The GeneXpert MRSA Assay (FDA     approved for NASAL specimens     only), is one component of a     comprehensive MRSA colonization     surveillance program. It is not     intended to diagnose MRSA     infection nor to guide or     monitor treatment for     MRSA infections.     Studies: Ct Abdomen Wo Contrast  05/09/2014   CLINICAL DATA:  Left-sided abdominal pain.  EXAM: CT ABDOMEN WITHOUT CONTRAST  TECHNIQUE: Multidetector CT imaging of the abdomen was performed following the standard protocol without IV contrast.  COMPARISON:  None.  FINDINGS: Images through the lung bases show tiny right pleural effusion. Mild ascites visualized in the abdomen and diffuse body wall edema is also seen within the subcutaneous tissues.  Noncontrast images of the liver, pancreas, spleen, adrenal glands, and kidneys are unremarkable. No evidence hydronephrosis. No abdominal soft tissue masses or lymphadenopathy identified. No evidence of focal inflammatory process. The visualized intra-abdominal bowel loops are nondilated.  IMPRESSION: Mild abdominal ascites and diffuse abdominal wall edema. Tiny right pleural effusion also noted.  No focal soft tissue mass or inflammatory process identified.   Electronically Signed   By: Myles RosenthalJohn  Stahl M.D.   On: 05/09/2014 11:33   Dg Chest 2 View  05/08/2014   CLINICAL DATA:  Leg swelling and numbness.  EXAM: CHEST  2 VIEW  COMPARISON:  None.  FINDINGS: Mild to moderate cardiomegaly. No concerning upper mediastinal contour change. Mild hypoaeration with interstitial crowding at the bases. No consolidation, edema, effusion, or pneumothorax.  IMPRESSION: Cardiomegaly without failure.   Electronically Signed   By: Tiburcio PeaJonathan  Watts M.D.   On: 05/08/2014 23:19   [place] Renal  05/09/2014   CLINICAL DATA:   Renal insufficiency  EXAM: RETROPERITONEAL ULTRASOUND  COMPARISON:  None.  FINDINGS: Right Kidney:  Length: 11.4 cm. Echogenicity and renal cortical thickness within normal limits. No mass or hydronephrosis visualized. There is no perinephric fluid. There is no sonographically demonstrable calculus or ureterectasis.  Left Kidney:  Length: 12.3 cm. Echogenicity and renal cortical thickness within normal limits. No mass or hydronephrosis visualized. There is no perinephric fluid. There is no sonographically demonstrable calculus or ureterectasis.  Bladder:  Appears normal for degree of bladder distention.  There is moderate generalized ascites.  IMPRESSION: Moderate generalized ascites.  Study otherwise unremarkable.   Electronically Signed   By: Bretta BangWilliam  Woodruff M.D.   On: 05/09/2014 08:57    Scheduled Meds: . insulin aspart  0-5 Units Subcutaneous QHS  . insulin aspart  0-9 Units Subcutaneous TID WC  . lisinopril  2.5 mg Oral Daily  . metoprolol tartrate  25 mg Oral QID  . sodium chloride  3 mL Intravenous Q12H  . sodium chloride  3 mL Intravenous Q12H   Continuous Infusions: . heparin 1,550 Units/hr (05/10/14 0850)    Principal Problem:   Acute systolic heart failure Active Problems:   Dyspnea   Atrial fibrillation with RVR   AKI (acute kidney injury)   Swelling of lower extremity   Depression   Type 2 diabetes, uncontrolled, with renal manifestation   CKD (chronic kidney disease), stage III    Time spent: 25 minutes   Hollice Espy  [facility] Pager [phone]. If 7PM-7AM, please contact night-coverage at [email or web address], password [facility] 05/10/2014, 1:46 PM  LOS: 2 days

## 2014-05-10 NOTE — Discharge Instructions (Addendum)

## 2014-05-10 NOTE — Progress Notes (Signed)
Report given to receiving RN. Patient sitting in recliner. No verbal complaints. No signs or symptoms of distress or discomfort noted.  

## 2014-05-10 NOTE — Progress Notes (Signed)
6:03 AM      1.08 HL and subsequent 2.16 HL were drawn upstream in arm with heparin infusing. Last level 0.23. These levels are errors. Entering stat HL with instructions to be drawn in opposite arm  Janice Coffin

## 2014-05-10 NOTE — Progress Notes (Signed)
ANTICOAGULATION CONSULT NOTE - Follow Up Consult  Pharmacy Consult for Heparin/Coumadin Indication: atrial fibrillation  No Known Allergies  Patient Measurements: Height: 5\' 8"  (172.7 cm) Weight: 259 lb 0.7 oz (117.5 kg) IBW/kg (Calculated) : 68.4 Heparin Dosing Weight: 96 kg  Vital Signs: Temp: 97.5 F (36.4 C) (07/02 1630) Temp src: Oral (07/02 1630) BP: 121/70 mmHg (07/02 1630) Pulse Rate: 96 (07/02 1630)  Labs:  Recent Labs  05/08/14 2240 05/09/14 0125 05/09/14 0311 05/09/14 1350  05/10/14 0305 05/10/14 0655 05/10/14 1620  HGB  --  13.1 12.9*  --   --  12.2*  --   --   HCT  --  39.2 39.4  --   --  37.2*  --   --   PLT  --  159 153  --   --  151  --   --   LABPROT  --   --   --   --   --   --   --  14.6  INR  --   --   --   --   --   --   --  1.14  HEPARINUNFRC  --   --   --   --   < > 2.16* 0.85* 0.80*  CREATININE  --  1.61* 1.66*  --   --  1.43*  --   --   TROPONINI <0.30 <0.30  --  <0.30  --   --   --   --   < > = values in this interval not displayed.  Estimated Creatinine Clearance: 65.8 ml/min (by C-G formula based on Cr of 1.43).  Assessment::new onset afib  Anticoagulation: Heparin for afib. CHADS2VASC of 3 (HTN, CHF, T2DM). Noted per Cardiology- May need long term anticoagulation but concern for patient follow-up (H/o psych issues and no medical f/u since [facility] closed). HL =0.85>>0.8. Cards adding coumadin 7/2. Baseline INR 1.14   Goal of Therapy:  Heparin level 0.3-0.7 units/ml Monitor platelets by anticoagulation protocol: Yes   Plan:  Decrease IV heparin to 1350 units/hr. Coumadin 7.5mg  po x 1 tonight. Daily INR Recheck heparin level in 6 hrs.   Kairyn Olmeda S. Merilynn Finland, PharmD, BCPS Clinical Staff Pharmacist Pager [phone]  Misty Stanley Stillinger 05/10/2014,5:22 PM

## 2014-05-10 NOTE — Progress Notes (Signed)
ANTICOAGULATION CONSULT NOTE - Follow Up Consult  Pharmacy Consult for Heparin Indication: atrial fibrillation  No Known Allergies  Patient Measurements: Height: 5\' 8"  (172.7 cm) Weight: 259 lb 0.7 oz (117.5 kg) IBW/kg (Calculated) : 68.4 Heparin Dosing Weight: 96 kg  Vital Signs: Temp: 97.9 F (36.6 C) (07/02 0400) Temp src: Oral (07/02 0400) BP: 135/83 mmHg (07/02 0807) Pulse Rate: 100 (07/02 0807)  Labs:  Recent Labs  05/08/14 2240 05/09/14 0125 05/09/14 0311 05/09/14 1350 05/09/14 1845 05/10/14 0305 05/10/14 0655  HGB  --  13.1 12.9*  --   --  12.2*  --   HCT  --  39.2 39.4  --   --  37.2*  --   PLT  --  159 153  --   --  151  --   HEPARINUNFRC  --   --   --   --  0.23* 2.16* 0.85*  CREATININE  --  1.61* 1.66*  --   --  1.43*  --   TROPONINI <0.30 <0.30  --  <0.30  --   --   --     Estimated Creatinine Clearance: 65.8 ml/min (by C-G formula based on Cr of 1.43).   Assessment:  63 yo male on heparin for new onset afib on heparin at 1700 units/hr and heparin level noted 0.85 (last heparin level 2.16 was error). Hg/Hct= 12.2/37.2 (stable) and plt= 151. Noted per Cardiology- May need long term anticoagulation  but concern for patient follow-up.  Goal of Therapy:  Heparin level 0.3-0.7 units/ml Monitor platelets by anticoagulation protocol: Yes   Plan:  -Decrease heparin to 1550 units/hr -Heparin level in 6 hrs -Daily heparin level and CBC  Harland German, Pharm D 05/10/2014 8:36 AM

## 2014-05-11 DIAGNOSIS — M7989 Other specified soft tissue disorders: Secondary | ICD-10-CM

## 2014-05-11 LAB — COMPREHENSIVE METABOLIC PANEL
ALBUMIN: 3 g/dL — AB (ref 3.5–5.2)
ALT: 37 U/L (ref 0–53)
ANION GAP: 10 (ref 5–15)
AST: 38 U/L — ABNORMAL HIGH (ref 0–37)
Alkaline Phosphatase: 26 U/L — ABNORMAL LOW (ref 39–117)
BUN: 27 mg/dL — AB (ref 6–23)
CO2: 30 mEq/L (ref 19–32)
CREATININE: 1.48 mg/dL — AB (ref 0.50–1.35)
Calcium: 8.4 mg/dL (ref 8.4–10.5)
Chloride: 101 mEq/L (ref 96–112)
GFR calc Af Amer: 56 mL/min — ABNORMAL LOW (ref >90)
GFR calc non Af Amer: 49 mL/min — ABNORMAL LOW (ref >90)
Glucose, Bld: 122 mg/dL — ABNORMAL HIGH (ref 70–99)
Potassium: 4.4 mEq/L (ref 3.7–5.3)
Sodium: 141 mEq/L (ref 137–147)
TOTAL PROTEIN: 6 g/dL (ref 6.0–8.3)
Total Bilirubin: 0.3 mg/dL (ref 0.3–1.2)

## 2014-05-11 LAB — CBC WITH DIFFERENTIAL/PLATELET
BASOS ABS: 0 10*3/uL (ref 0.0–0.1)
Basophils Relative: 1 % (ref 0–1)
Eosinophils Absolute: 0.3 10*3/uL (ref 0.0–0.7)
Eosinophils Relative: 3 % (ref 0–5)
HEMATOCRIT: 38.3 % — AB (ref 39.0–52.0)
HEMOGLOBIN: 12.2 g/dL — AB (ref 13.0–17.0)
LYMPHS ABS: 1.7 10*3/uL (ref 0.7–4.0)
Lymphocytes Relative: 22 % (ref 12–46)
MCH: 28.4 pg (ref 26.0–34.0)
MCHC: 31.9 g/dL (ref 30.0–36.0)
MCV: 89.1 fL (ref 78.0–100.0)
MONO ABS: 0.6 10*3/uL (ref 0.1–1.0)
MONOS PCT: 8 % (ref 3–12)
NEUTROS ABS: 5.4 10*3/uL (ref 1.7–7.7)
Neutrophils Relative %: 66 % (ref 43–77)
Platelets: 144 10*3/uL — ABNORMAL LOW (ref 150–400)
RBC: 4.3 MIL/uL (ref 4.22–5.81)
RDW: 14.6 % (ref 11.5–15.5)
WBC: 8.1 10*3/uL (ref 4.0–10.5)

## 2014-05-11 LAB — GLUCOSE, CAPILLARY
GLUCOSE-CAPILLARY: 118 mg/dL — AB (ref 70–99)
GLUCOSE-CAPILLARY: 186 mg/dL — AB (ref 70–99)
Glucose-Capillary: 142 mg/dL — ABNORMAL HIGH (ref 70–99)
Glucose-Capillary: 114 mg/dL — ABNORMAL HIGH (ref 70–99)

## 2014-05-11 LAB — PROTIME-INR
INR: 1.16 (ref 0.00–1.49)
Prothrombin Time: 14.8 seconds (ref 11.6–15.2)

## 2014-05-11 LAB — HEPARIN LEVEL (UNFRACTIONATED)
HEPARIN UNFRACTIONATED: 0.49 [IU]/mL (ref 0.30–0.70)
Heparin Unfractionated: 0.48 IU/mL (ref 0.30–0.70)

## 2014-05-11 MED ORDER — METOPROLOL TARTRATE 50 MG PO TABS
50.0000 mg | ORAL_TABLET | Freq: Four times a day (QID) | ORAL | Status: DC
  Administered 2014-05-11 (×2): 50 mg via ORAL
  Filled 2014-05-11 (×7): qty 1

## 2014-05-11 MED ORDER — WARFARIN SODIUM 7.5 MG PO TABS
7.5000 mg | ORAL_TABLET | Freq: Once | ORAL | Status: AC
  Administered 2014-05-11: 7.5 mg via ORAL
  Filled 2014-05-11: qty 1

## 2014-05-11 MED ORDER — FUROSEMIDE 40 MG PO TABS
40.0000 mg | ORAL_TABLET | Freq: Two times a day (BID) | ORAL | Status: DC
  Administered 2014-05-11 (×2): 40 mg via ORAL
  Filled 2014-05-11 (×5): qty 1

## 2014-05-11 NOTE — Progress Notes (Signed)
Made MD aware of increase HR. Patient is asymptomatic. BP is stable.  New orders was given. Will continue to monitor for further changes in condition.

## 2014-05-11 NOTE — Progress Notes (Signed)
Pt had 9 beats of V-tach on the monitor at 0207am Pt was seating in his recliner and got up to use the bathroom Pt says he is Ok ,will continue to monitor

## 2014-05-11 NOTE — Progress Notes (Signed)
Inpatient Diabetes Program Recommendations  AACE/ADA: New Consensus Statement on Inpatient Glycemic Control (2013)  Target Ranges:  Prepandial:   less than 140 mg/dL      Peak postprandial:   less than 180 mg/dL (1-2 hours)      Critically ill patients:  140 - 180 mg/dL   Reason for Visit: Follow-up regarding new diagnosis of diabetes  Note:  Nurse tech requested I speak with patient regarding diabetes.  States patient wants more information.  Visited patient in room.  Two of his friends were visiting part of the time.  Discussed basic issues related to type 2 diabetes.  Gave him an instructional DVD regarding diabetes to take home with him.  Gave him a booklet regarding general health issues that need to be monitored since he has diabetes.  Gave him specific information regarding a generic glucose meter available at [facility].  Patient discussed various homeopathic treatments he is aware of for heart problems and general health maintenance.  Discussing intention of increasing his activity gradually to positively impact his general health, cardiac health, and diabetes.  Cautioned him to seek medical advice regarding specific instructions for any exercise.  Suggested he make a list of questions for his MD here in hospital as well as the PCP he will see after discharge.  Thank you.  Merion Caton S. Elsie Lincoln, RN, CNS, CDE Inpatient Diabetes Program, team pager [phone]

## 2014-05-11 NOTE — Progress Notes (Signed)
[facility] PROGRESS NOTE  STEPHENS VANROOYEN SRP:[id number] DOB: 20-Mar-1951 DOA: 05/08/2014 PCP: No PCP Per Patient  Assessment/Plan: Principal Problem:   Acute systolic heart failure: markedly depressed ejection fraction.  Have started beta blocker and ACE inhibitor plus Lasix. Diuresed almost 6 L. Plan is to recheck echocardiogram as outpatient in several months.    Active Problems:    Dyspnea: Secondary to heart failure and atrial fibrillation. Resolved. Off of oxygen    Atrial fibrillation with RVR: Weaned off of Cardizem drip. C change Cardizem to 08 which is been since changed to metoprolol every 6 hours, difficulty controlling and doses increased  Started on Coumadin  Obesity: Patient meets criteria With BMI greater than 30.  Ascites: Initially seen on abdominal ultrasound and confirmed with CT. Unclear etiology but likely in the setting of heart failure, this may be cause. Liver looked okay by a noncontrast CT. Transaminases appear relatively stable. Would not workup further at this time, unless it worsens    Depression: Previous history. Patient seems in good spirits, no signs of active depression    Type 2 diabetes, uncontrolled, with renal manifestation: Diagnosis. A1c of 9.0. we'll start sliding scale and likely outpatient metformin.  Stable    CKD (chronic kidney disease), stage III: Likely from underlying hypertension and diabetes. No previous baseline. Started low-dose ACE inhibitor on 7/1.Marland Kitchen Monitoring renal function, creatinine has since improved  Code Status: full code  Family Communication: no family present.  Disposition Plan: Discharge the next few days once fully diuresed and INR therapeutic   Consultants:  cardiology  Procedures:  Echocardiogram: Difficult study given atrial fibrillation. Markedly decreased ejection fraction of 25-30%  Antibiotics:  none  HPI/Subjective: Patient doing okay. No shortness of breath. Wants to be educated on his  medicines and diet  Objective: Filed Vitals:   05/11/14 1035  BP: 119/74  Pulse: 123  Temp:   Resp:     Intake/Output Summary (Last 24 hours) at 05/11/14 1132 Last data filed at 05/11/14 0900  Gross per 24 hour  Intake    240 ml  Output   2301 ml  Net  -2061 ml   Filed Weights   05/09/14 0500 05/10/14 1900 05/11/14 0429  Weight: 117.5 kg (259 lb 0.7 oz) 116.665 kg (257 lb 3.2 oz) 116.4 kg (256 lb 9.9 oz)    Exam:   General: alert and oriented x3, no acute distress  Cardiovascular: irregular rhythm, borderline tachycardia  Respiratory: Clear to auscultation bilaterally  Abdomen: soft, obese, nontender, positive bowel sounds  Musculoskeletal: No clubbing or cyanosis cyanosis, trace pitting edema bilaterally   Data Reviewed: Basic Metabolic Panel:  Recent Labs Lab 05/08/14 2040 05/09/14 0125 05/09/14 0311 05/10/14 0305 05/11/14 0426  NA 139  --  134* 136* 141  K 4.7  --  4.2 3.3* 4.4  CL 101  --  96 98 101  CO2 22  --  22 24 30   GLUCOSE 162*  --  127* 134* 122*  BUN 40*  --  37* 31* 27*  CREATININE 1.95* 1.61* 1.66* 1.43* 1.48*  CALCIUM 8.8  --  8.8 8.1* 8.4   Liver Function Tests:  Recent Labs Lab 05/09/14 0311 05/10/14 0305 05/11/14 0426  AST 36 38* 38*  ALT 42 41 37  ALKPHOS 29* 27* 26*  BILITOT 0.6 0.5 0.3  PROT 6.4 6.0 6.0  ALBUMIN 3.4* 3.0* 3.0*   No results found for this basename: LIPASE, AMYLASE,  in the last 168 hours No results found for  this basename: AMMONIA,  in the last 168 hours CBC:  Recent Labs Lab 05/08/14 2040 05/09/14 0125 05/09/14 0311 05/10/14 0305 05/11/14 0426  WBC 10.0 9.8 9.3 8.8 8.1  NEUTROABS  --   --  6.5 6.4 5.4  HGB 13.5 13.1 12.9* 12.2* 12.2*  HCT 41.8 39.2 39.4 37.2* 38.3*  MCV 89.1 87.5 87.9 88.4 89.1  PLT 175 159 153 151 144*   Cardiac Enzymes:  Recent Labs Lab 05/08/14 2240 05/09/14 0125 05/09/14 1350  TROPONINI <0.30 <0.30 <0.30   BNP (last 3 results)  Recent Labs  05/08/14 2042   PROBNP 3681.0*   CBG:  Recent Labs Lab 05/10/14 0805 05/10/14 1211 05/10/14 1635 05/10/14 2125 05/11/14 0618  GLUCAP 136* 135* 120* 138* 118*    Recent Results (from the past 240 hour(s))  MRSA PCR SCREENING     Status: None   Collection Time    05/09/14  2:05 AM      Result Value Ref Range Status   MRSA by PCR NEGATIVE  NEGATIVE Final   Comment:            The GeneXpert MRSA Assay (FDA     approved for NASAL specimens     only), is one component of a     comprehensive MRSA colonization     surveillance program. It is not     intended to diagnose MRSA     infection nor to guide or     monitor treatment for     MRSA infections.     Studies: No results found.  Scheduled Meds: . furosemide  40 mg Oral BID  . insulin aspart  0-5 Units Subcutaneous QHS  . insulin aspart  0-9 Units Subcutaneous TID WC  . lisinopril  2.5 mg Oral Daily  . metoprolol tartrate  50 mg Oral QID  . sodium chloride  3 mL Intravenous Q12H  . Warfarin - Pharmacist Dosing Inpatient   Does not apply q1800   Continuous Infusions: . heparin 1,350 Units/hr (05/10/14 1943)    Principal Problem:   Acute systolic heart failure Active Problems:   Dyspnea   Atrial fibrillation with RVR   AKI (acute kidney injury)   Swelling of lower extremity   Depression   Type 2 diabetes, uncontrolled, with renal manifestation   CKD (chronic kidney disease), stage III    Time spent: 15 minutes   Hollice EspyKRISHNAN,Emari Demmer K  [facility] Pager [phone]. If 7PM-7AM, please contact night-coverage at [email or web address], password [email or web address] 05/11/2014, 11:32 AM  LOS: 3 days

## 2014-05-11 NOTE — Progress Notes (Signed)
SUBJECTIVE:  Feels well.  No SHOB.  HR not controlled.  OBJECTIVE:   Vitals:   Filed Vitals:   05/10/14 1900 05/10/14 2022 05/11/14 0429 05/11/14 1035  BP: 105/82 112/84 102/66 119/74  Pulse: 83 98 92 123  Temp: 98 F (36.7 C) 98 F (36.7 C) 97.6 F (36.4 C)   TempSrc: Oral Oral Oral   Resp: 18 18 18    Height:      Weight: 257 lb 3.2 oz (116.665 kg)  256 lb 9.9 oz (116.4 kg)   SpO2: 98% 97% 98%    I&O's:   Intake/Output Summary (Last 24 hours) at 05/11/14 1111 Last data filed at 05/11/14 0900  Gross per 24 hour  Intake    240 ml  Output   2301 ml  Net  -2061 ml   TELEMETRY: Reviewed telemetry pt in AFib w/ RVR:     PHYSICAL EXAM General: Well developed, well nourished, in no acute distress Head:   Normal cephalic and atramatic  Lungs:   Clear bilaterally to auscultation. Heart:  Tachy, irregular S1 S2  No JVD.   Abdomen: abdomen soft and non-tender Msk:  Back normal,  Normal strength and tone for age. Extremities: 1+ bilateral LE edema.   Neuro: Alert and oriented. Psych:  Normal affect, responds appropriately   LABS: Basic Metabolic Panel:  Recent Labs  13/06/6506/02/15 0305 05/11/14 0426  NA 136* 141  K 3.3* 4.4  CL 98 101  CO2 24 30  GLUCOSE 134* 122*  BUN 31* 27*  CREATININE 1.43* 1.48*  CALCIUM 8.1* 8.4   Liver Function Tests:  Recent Labs  05/10/14 0305 05/11/14 0426  AST 38* 38*  ALT 41 37  ALKPHOS 27* 26*  BILITOT 0.5 0.3  PROT 6.0 6.0  ALBUMIN 3.0* 3.0*   No results found for this basename: LIPASE, AMYLASE,  in the last 72 hours CBC:  Recent Labs  05/10/14 0305 05/11/14 0426  WBC 8.8 8.1  NEUTROABS 6.4 5.4  HGB 12.2* 12.2*  HCT 37.2* 38.3*  MCV 88.4 89.1  PLT 151 144*   Cardiac Enzymes:  Recent Labs  05/08/14 2240 05/09/14 0125 05/09/14 1350  TROPONINI <0.30 <0.30 <0.30   BNP: No components found with this basename: POCBNP,  D-Dimer: No results found for this basename: DDIMER,  in the last 72 hours Hemoglobin  A1C:  Recent Labs  05/09/14 0125  HGBA1C 9.0*   Fasting Lipid Panel: No results found for this basename: CHOL, HDL, LDLCALC, TRIG, CHOLHDL, LDLDIRECT,  in the last 72 hours Thyroid Function Tests:  Recent Labs  05/09/14 0125  TSH 2.140   Anemia Panel: No results found for this basename: VITAMINB12, FOLATE, FERRITIN, TIBC, IRON, RETICCTPCT,  in the last 72 hours Coag Panel:   Lab Results  Component Value Date   INR 1.16 05/11/2014   INR 1.14 05/10/2014    RADIOLOGY: Ct Abdomen Wo Contrast  05/09/2014   CLINICAL DATA:  Left-sided abdominal pain.  EXAM: CT ABDOMEN WITHOUT CONTRAST  TECHNIQUE: Multidetector CT imaging of the abdomen was performed following the standard protocol without IV contrast.  COMPARISON:  None.  FINDINGS: Images through the lung bases show tiny right pleural effusion. Mild ascites visualized in the abdomen and diffuse body wall edema is also seen within the subcutaneous tissues.  Noncontrast images of the liver, pancreas, spleen, adrenal glands, and kidneys are unremarkable. No evidence hydronephrosis. No abdominal soft tissue masses or lymphadenopathy identified. No evidence of focal inflammatory process. The visualized intra-abdominal bowel loops  are nondilated.  IMPRESSION: Mild abdominal ascites and diffuse abdominal wall edema. Tiny right pleural effusion also noted.  No focal soft tissue mass or inflammatory process identified.   Electronically Signed   By: Myles Rosenthal M.D.   On: 05/09/2014 11:33   Dg Chest 2 View  05/08/2014   CLINICAL DATA:  Leg swelling and numbness.  EXAM: CHEST  2 VIEW  COMPARISON:  None.  FINDINGS: Mild to moderate cardiomegaly. No concerning upper mediastinal contour change. Mild hypoaeration with interstitial crowding at the bases. No consolidation, edema, effusion, or pneumothorax.  IMPRESSION: Cardiomegaly without failure.   Electronically Signed   By: Tiburcio Pea M.D.   On: 05/08/2014 23:19   US Renal  05/09/2014   CLINICAL DATA:   Renal insufficiency  EXAM: RETROPERITONEAL ULTRASOUND  COMPARISON:  None.  FINDINGS: Right Kidney:  Length: 11.4 cm. Echogenicity and renal cortical thickness within normal limits. No mass or hydronephrosis visualized. There is no perinephric fluid. There is no sonographically demonstrable calculus or ureterectasis.  Left Kidney:  Length: 12.3 cm. Echogenicity and renal cortical thickness within normal limits. No mass or hydronephrosis visualized. There is no perinephric fluid. There is no sonographically demonstrable calculus or ureterectasis.  Bladder:  Appears normal for degree of bladder distention.  There is moderate generalized ascites.  IMPRESSION: Moderate generalized ascites.  Study otherwise unremarkable.   Electronically Signed   By: Bretta Bang M.D.   On: 05/09/2014 08:57      ASSESSMENT: AFib, cardiomyopathy  PLAN:  Increase metoprolol to 50 mg q 6 hours for rate control.  Would consider digoxin for rate control if no improvement in  Rate by tomorrow.  Prefer dig to cardizem given LV dysfunction, but rate appears difficult to control.    Cardiomyopathy: Appears close to euvolemic.  Continue Lasix.    Spoke at length about avoiding salt.  Spoke about regular walking and gradually increasing duration.  Coumadin for stroke prevention.  Corky Crafts., MD  05/11/2014  11:11 AM

## 2014-05-11 NOTE — Progress Notes (Signed)
Report given to receiving RN. Patient sitting in recliner. No verbal complaints. No signs or symptoms of distress or discomfort noted.

## 2014-05-11 NOTE — Progress Notes (Signed)
ANTICOAGULATION CONSULT NOTE - Follow Up Consult  Pharmacy Consult for Heparin/Coumadin Indication: atrial fibrillation  Labs:  Recent Labs  05/08/14 2240 05/09/14 0125 05/09/14 0311 05/09/14 1350  05/10/14 0305 05/10/14 0655 05/10/14 1620 05/10/14 2355 05/11/14 0426  HGB  --  13.1 12.9*  --   --  12.2*  --   --   --  12.2*  HCT  --  39.2 39.4  --   --  37.2*  --   --   --  38.3*  PLT  --  159 153  --   --  151  --   --   --  144*  LABPROT  --   --   --   --   --   --   --  14.6  --  14.8  INR  --   --   --   --   --   --   --  1.14  --  1.16  HEPARINUNFRC  --   --   --   --   < > 2.16* 0.85* 0.80* 0.49 0.48  CREATININE  --  1.61* 1.66*  --   --  1.43*  --   --   --  1.48*  TROPONINI <0.30 <0.30  --  <0.30  --   --   --   --   --   --   < > = values in this interval not displayed.   Assessment:  63yo male with  Therapeutic heparin level = 0.48 on heparin drip rate 1350 units/hr for atrial fibrilation with RVR.  INR = 1.16. Coumadin just started yesterday 05/10/14.   CBC stable, pltc 144K.  No bleeding noted.  Plan:  Continue IV heparin drip at 1350 units/hr Give Coumadin 7.5 mg today Daily heparin level, PT/INR and CBC.  Noah Delaine, RPh Clinical Pharmacist Pager: [phone] 05/11/2014,12:11 PM

## 2014-05-11 NOTE — Progress Notes (Signed)
Nutrition Education Note  RD consulted for nutrition education regarding new onset CHF and diabetic diet review.   RD provided "Low Sodium Nutrition Therapy" handout from the Academy of [employer] and [employer]. Reviewed patient's dietary recall. Provided examples on ways to decrease sodium intake in diet. Discouraged intake of processed foods and use of salt shaker. Encouraged fresh fruits and vegetables as well as whole grain sources of carbohydrates to maximize fiber intake. Provided patient with sample menus and "Sodium Content of Foods List".   RD discussed why it is important for patient to adhere to diet recommendations, and emphasized the role of fluids, foods to avoid, and importance of weighing self daily. Teach back method used.  Expect fair compliance. Pt receives free meals at various locations with limited food options. RD discussed ways to decrease sodium.   RD consulted for nutrition education regarding diabetes.   Lab Results  Component Value Date   HGBA1C 9.0* 05/09/2014    RD provided "Carbohydrate Counting for People with Diabetes" handout from the Academy of Nutrition and Dietetics. Discussed different food groups and their effects on blood sugar, emphasizing carbohydrate-containing foods. Provided list of carbohydrates and recommended serving sizes of common foods.  Discussed importance of controlled and consistent carbohydrate intake throughout the day. Provided examples of ways to balance meals/snacks and encouraged intake of high-fiber, whole grain complex carbohydrates. Teach back method used.  Expect good compliance.  Current diet order is Carb Modified/Heart Healthy, patient is consuming approximately 100% of meals at this time. Labs and medications reviewed. No further nutrition interventions warranted at this time. RD contact information provided. If additional nutrition issues arise, please re-consult RD.  Ian Malkin RD, LDN Inpatient Clinical  Dietitian Pager: [phone] After Hours Pager: [phone]

## 2014-05-11 NOTE — Progress Notes (Signed)
ANTICOAGULATION CONSULT NOTE - Follow Up Consult  Pharmacy Consult for heparin Indication: atrial fibrillation  Labs:  Recent Labs  05/08/14 2240 05/09/14 0125 05/09/14 0311 05/09/14 1350  05/10/14 0305 05/10/14 0655 05/10/14 1620 05/10/14 2355  HGB  --  13.1 12.9*  --   --  12.2*  --   --   --   HCT  --  39.2 39.4  --   --  37.2*  --   --   --   PLT  --  159 153  --   --  151  --   --   --   LABPROT  --   --   --   --   --   --   --  14.6  --   INR  --   --   --   --   --   --   --  1.14  --   HEPARINUNFRC  --   --   --   --   < > 2.16* 0.85* 0.80* 0.49  CREATININE  --  1.61* 1.66*  --   --  1.43*  --   --   --   TROPONINI <0.30 <0.30  --  <0.30  --   --   --   --   --   < > = values in this interval not displayed.   Assessment/Plan:  63yo male now therapeutic on heparin after rate adjustments.  Will continue gtt at current rate and confirm stable with am labs.  Vernard Gambles, PharmD, BCPS  05/11/2014,1:12 AM

## 2014-05-11 NOTE — Care Management Note (Addendum)
  Page 2 of 2   05/16/2014     12:40:49 PM CARE MANAGEMENT NOTE 05/16/2014  Patient:  [name], [name]   Account Number:  [id number]  Date Initiated:  05/10/2014  Documentation initiated by:  MAYO,HENRIETTA  Subjective/Objective Assessment:   dx AFib w/RVR/systolic failure/new onset DM     Action/Plan:   Anticipated DC Date:  05/16/2014   Anticipated DC Plan:  HOME/SELF CARE      DC Planning Services  CM consult  [facility]  [facility] Program      Choice offered to / List presented to:             Status of service:  Completed, signed off Medicare Important Message given?   (If response is "NO", the following Medicare IM given date fields will be blank) Date Medicare IM given:   Medicare IM given by:   Date Additional Medicare IM given:   Additional Medicare IM given by:    Discharge Disposition:  HOME/SELF CARE  Per UR Regulation:  Reviewed for med. necessity/level of care/duration of stay  If discussed at Long Length of Stay Meetings, dates discussed:    Comments:  Keiyon Plack RN, BSN, MSHL, CCM  Nurse - Case Manager,  ([facility] [place])  [phone]  05/16/2014 CM provided [facility] Medication Assistance program CM provided copy of [place] Card appliction and instructed to start completing and gathering forms for [facility] Card/FA appt on 06/08/2014 PCP:  [facility] initiated 06/08/2014 and center will contact patient if able to accomodate earlier. CM provided copy of $4.00 med list for future reference as needed. [facility]:  appt with NP Karie Mainland 05/21/2014 Dispostion:  Home Self Care.     05/10/14 1530 Henrietta Mayo RN MSN BSN CCM Pt has no PCP, reports he took early retirement and income is limited.  Provided contact information for [facility] and [facility].  Discussed medicaid application-financial counselor is assisting.  Pt states he will call both clinics to determine which can offer earliest appointment.  Pt will qualify for [facility] program. 1650 Pt states  he has talked with [facility] scheduler and has left several messages with [facility] in an effort to get the earliest possible appointment.  Per diabetes educator, pt will probably d/c on metformin, which is on [facility] $4 list.

## 2014-05-12 LAB — PROTIME-INR
INR: 1.16 (ref 0.00–1.49)
Prothrombin Time: 14.8 seconds (ref 11.6–15.2)

## 2014-05-12 LAB — COMPREHENSIVE METABOLIC PANEL
ALK PHOS: 29 U/L — AB (ref 39–117)
ALT: 38 U/L (ref 0–53)
AST: 43 U/L — ABNORMAL HIGH (ref 0–37)
Albumin: 3.2 g/dL — ABNORMAL LOW (ref 3.5–5.2)
Anion gap: 13 (ref 5–15)
BILIRUBIN TOTAL: 0.4 mg/dL (ref 0.3–1.2)
BUN: 28 mg/dL — ABNORMAL HIGH (ref 6–23)
CO2: 27 mEq/L (ref 19–32)
Calcium: 9.1 mg/dL (ref 8.4–10.5)
Chloride: 99 mEq/L (ref 96–112)
Creatinine, Ser: 1.43 mg/dL — ABNORMAL HIGH (ref 0.50–1.35)
GFR calc non Af Amer: 51 mL/min — ABNORMAL LOW (ref >90)
GFR, EST AFRICAN AMERICAN: 59 mL/min — AB (ref >90)
Glucose, Bld: 124 mg/dL — ABNORMAL HIGH (ref 70–99)
POTASSIUM: 4.6 meq/L (ref 3.7–5.3)
Sodium: 139 mEq/L (ref 137–147)
TOTAL PROTEIN: 6.5 g/dL (ref 6.0–8.3)

## 2014-05-12 LAB — GLUCOSE, CAPILLARY
GLUCOSE-CAPILLARY: 130 mg/dL — AB (ref 70–99)
GLUCOSE-CAPILLARY: 147 mg/dL — AB (ref 70–99)
Glucose-Capillary: 144 mg/dL — ABNORMAL HIGH (ref 70–99)
Glucose-Capillary: 120 mg/dL — ABNORMAL HIGH (ref 70–99)

## 2014-05-12 LAB — HEPARIN LEVEL (UNFRACTIONATED): Heparin Unfractionated: 0.39 IU/mL (ref 0.30–0.70)

## 2014-05-12 MED ORDER — FUROSEMIDE 10 MG/ML IJ SOLN
60.0000 mg | Freq: Two times a day (BID) | INTRAMUSCULAR | Status: DC
  Administered 2014-05-12 – 2014-05-15 (×7): 60 mg via INTRAVENOUS
  Filled 2014-05-12 (×10): qty 6

## 2014-05-12 MED ORDER — DIGOXIN 0.25 MG/ML IJ SOLN
0.5000 mg | Freq: Once | INTRAMUSCULAR | Status: AC
  Administered 2014-05-12: 0.5 mg via INTRAVENOUS
  Filled 2014-05-12: qty 2

## 2014-05-12 MED ORDER — WARFARIN SODIUM 10 MG PO TABS
10.0000 mg | ORAL_TABLET | Freq: Once | ORAL | Status: AC
  Administered 2014-05-12: 10 mg via ORAL
  Filled 2014-05-12: qty 1

## 2014-05-12 MED ORDER — ZOLPIDEM TARTRATE 5 MG PO TABS
5.0000 mg | ORAL_TABLET | Freq: Every day | ORAL | Status: DC
  Administered 2014-05-12 – 2014-05-15 (×4): 5 mg via ORAL
  Filled 2014-05-12 (×4): qty 1

## 2014-05-12 MED ORDER — METOPROLOL TARTRATE 100 MG PO TABS
100.0000 mg | ORAL_TABLET | Freq: Two times a day (BID) | ORAL | Status: AC
  Administered 2014-05-12: 100 mg via ORAL
  Filled 2014-05-12: qty 1

## 2014-05-12 NOTE — Progress Notes (Signed)
ANTICOAGULATION CONSULT NOTE - Follow Up Consult  Pharmacy Consult for Heparin/Coumadin Indication: atrial fibrillation  Labs:  Recent Labs  05/09/14 1350  05/10/14 0305  05/10/14 1620 05/10/14 2355 05/11/14 0426 05/12/14 0417  HGB  --   --  12.2*  --   --   --  12.2*  --   HCT  --   --  37.2*  --   --   --  38.3*  --   PLT  --   --  151  --   --   --  144*  --   LABPROT  --   --   --   --  14.6  --  14.8 14.8  INR  --   --   --   --  1.14  --  1.16 1.16  HEPARINUNFRC  --   < > 2.16*  < > 0.80* 0.49 0.48 0.39  CREATININE  --   --  1.43*  --   --   --  1.48* 1.43*  TROPONINI <0.30  --   --   --   --   --   --   --   < > = values in this interval not displayed.   Assessment:  63yo male with  Therapeutic heparin level = 0.39 on heparin drip rate 1350 units/hr for new onset atrial fibrilation with RVR.  Coumadin  started on  05/10/14.  INR = 1.16, unchanged from yesterday.  CBC last checked on 7/3, stable, pltc 144K.  No bleeding noted. Good diet intake (100 % of meals).   Plan:  Continue IV heparin drip at 1350 units/hr Give Coumadin 10 mg today Daily heparin level, PT/INR and CBC.  Noah Delaine, RPh Clinical Pharmacist Pager: [phone] 05/12/2014,11:23 AM

## 2014-05-12 NOTE — Progress Notes (Signed)
Patient ID: [name], male   DOB: 03-03-1951, 63 y.o.   MRN: [id number]   SUBJECTIVE:  Dyspnea stable but not back to baseline.  HR not controlled.  OBJECTIVE:   Vitals:   Filed Vitals:   05/11/14 1300 05/11/14 1743 05/11/14 2115 05/12/14 0644  BP: 123/72 125/81 121/87 144/89  Pulse: 89 96 98 101  Temp:   97.8 F (36.6 C) 97.9 F (36.6 C)  TempSrc:   Oral Oral  Resp: 18  18 18   Height:      Weight:    258 lb 12.8 oz (117.391 kg)  SpO2: 98%  98% 97%   I&O's:    Intake/Output Summary (Last 24 hours) at 05/12/14 8682 Last data filed at 05/12/14 5749  Gross per 24 hour  Intake    720 ml  Output   1751 ml  Net  -1031 ml   TELEMETRY: Reviewed telemetry pt in AFib w/ RVR, rates in the 120-130's at rest     PHYSICAL EXAM General: Well developed, well nourished, in no acute distress, obese Head:   Normal cephalic and atramatic  Lungs:   Clear bilaterally to auscultation. Heart:  Tachy, irregular S1 S2  No JVD.   Abdomen: abdomen soft and non-tender Msk:  Back normal,  Normal strength and tone for age. Extremities: 1+ bilateral LE edema.   Neuro: Alert and oriented. Psych:  Normal affect, responds appropriately   LABS: Basic Metabolic Panel:  Recent Labs  35/52/17 0426 05/12/14 0417  NA 141 139  K 4.4 4.6  CL 101 99  CO2 30 27  GLUCOSE 122* 124*  BUN 27* 28*  CREATININE 1.48* 1.43*  CALCIUM 8.4 9.1   Liver Function Tests:  Recent Labs  05/11/14 0426 05/12/14 0417  AST 38* 43*  ALT 37 38  ALKPHOS 26* 29*  BILITOT 0.3 0.4  PROT 6.0 6.5  ALBUMIN 3.0* 3.2*   No results found for this basename: LIPASE, AMYLASE,  in the last 72 hours CBC:  Recent Labs  05/10/14 0305 05/11/14 0426  WBC 8.8 8.1  NEUTROABS 6.4 5.4  HGB 12.2* 12.2*  HCT 37.2* 38.3*  MCV 88.4 89.1  PLT 151 144*   Cardiac Enzymes:  Recent Labs  05/09/14 1350  TROPONINI <0.30   BNP: No components found with this basename: POCBNP,  D-Dimer: No results found for this  basename: DDIMER,  in the last 72 hours Hemoglobin A1C: No results found for this basename: HGBA1C,  in the last 72 hours Fasting Lipid Panel: No results found for this basename: CHOL, HDL, LDLCALC, TRIG, CHOLHDL, LDLDIRECT,  in the last 72 hours Thyroid Function Tests: No results found for this basename: TSH, T4TOTAL, FREET3, T3FREE, THYROIDAB,  in the last 72 hours Anemia Panel: No results found for this basename: VITAMINB12, FOLATE, FERRITIN, TIBC, IRON, RETICCTPCT,  in the last 72 hours Coag Panel:   Lab Results  Component Value Date   INR 1.16 05/12/2014   INR 1.16 05/11/2014   INR 1.14 05/10/2014    RADIOLOGY: Ct Abdomen Wo Contrast  05/09/2014   CLINICAL DATA:  Left-sided abdominal pain.  EXAM: CT ABDOMEN WITHOUT CONTRAST  TECHNIQUE: Multidetector CT imaging of the abdomen was performed following the standard protocol without IV contrast.  COMPARISON:  None.  FINDINGS: Images through the lung bases show tiny right pleural effusion. Mild ascites visualized in the abdomen and diffuse body wall edema is also seen within the subcutaneous tissues.  Noncontrast images of the liver, pancreas, spleen, adrenal glands,  and kidneys are unremarkable. No evidence hydronephrosis. No abdominal soft tissue masses or lymphadenopathy identified. No evidence of focal inflammatory process. The visualized intra-abdominal bowel loops are nondilated.  IMPRESSION: Mild abdominal ascites and diffuse abdominal wall edema. Tiny right pleural effusion also noted.  No focal soft tissue mass or inflammatory process identified.   Electronically Signed   By: Myles RosenthalJohn  Stahl M.D.   On: 05/09/2014 11:33   Dg Chest 2 View  05/08/2014   CLINICAL DATA:  Leg swelling and numbness.  EXAM: CHEST  2 VIEW  COMPARISON:  None.  FINDINGS: Mild to moderate cardiomegaly. No concerning upper mediastinal contour change. Mild hypoaeration with interstitial crowding at the bases. No consolidation, edema, effusion, or pneumothorax.  IMPRESSION:  Cardiomegaly without failure.   Electronically Signed   By: Tiburcio PeaJonathan  Watts M.D.   On: 05/08/2014 23:19   [place] Renal  05/09/2014   CLINICAL DATA:  Renal insufficiency  EXAM: RETROPERITONEAL ULTRASOUND  COMPARISON:  None.  FINDINGS: Right Kidney:  Length: 11.4 cm. Echogenicity and renal cortical thickness within normal limits. No mass or hydronephrosis visualized. There is no perinephric fluid. There is no sonographically demonstrable calculus or ureterectasis.  Left Kidney:  Length: 12.3 cm. Echogenicity and renal cortical thickness within normal limits. No mass or hydronephrosis visualized. There is no perinephric fluid. There is no sonographically demonstrable calculus or ureterectasis.  Bladder:  Appears normal for degree of bladder distention.  There is moderate generalized ascites.  IMPRESSION: Moderate generalized ascites.  Study otherwise unremarkable.   Electronically Signed   By: Bretta BangWilliam  Woodruff M.D.   On: 05/09/2014 08:57      ASSESSMENT:  1. Atrial fib with an uncontrolled VR 2. Acute systolic heart failure, probably tachy mediated 3. New initiation of coumadin/heparin  PLAN:  Increase metoprolol to 100 q 12hours for rate control.  Start IV digoxin today.    Cardiomyopathy: Appears to be volume overloaded.  Continue Lasix and switch to IV.    Spoke at length about avoiding salt.  Spoke about regular walking and gradually increasing duration.  Coumadin for stroke prevention. INR has not budged. Will need more coumadin.  Lewayne BuntingGregg Taylor, MD  05/12/2014  8:23 AM

## 2014-05-12 NOTE — Progress Notes (Signed)
Report given to receiving RN. No signs of distress noted. 

## 2014-05-12 NOTE — Progress Notes (Signed)
Patient informed me of sleep study done as out patient over 10 years ago, and wanted me to make the MD aware. MD notified and made aware. No new orders given at this time.

## 2014-05-12 NOTE — Progress Notes (Signed)
[facility] PROGRESS NOTE  [name] WLN:[id number] DOB: 12-May-1951 DOA: 05/08/2014 PCP: No PCP Per Patient  Assessment/Plan: Principal Problem:   Acute systolic heart failure: markedly depressed ejection fraction.  Have started beta blocker and ACE inhibitor plus Lasix. Diuresed more than 7 lit. Plan is to recheck echocardiogram as outpatient in several months.    Active Problems:    Dyspnea: Secondary to heart failure and atrial fibrillation. Resolved. Off of oxygen    Atrial fibrillation with RVR: Weaned off of Cardizem drip. C change Cardizem to 08 which is been since changed to metoprolol every 6 hours, difficulty controlling and doses increased  metoprolol increased and digoxin added. Started on Coumadin-not yet therapeutic.  Obesity: Patient meets criteria With BMI greater than 30.  Ascites: Initially seen on abdominal ultrasound and confirmed with CT. Unclear etiology but likely in the setting of heart failure, this may be cause. Liver looked okay by a noncontrast CT. Transaminases appear relatively stable. Would not workup further at this time, unless it worsens    Depression: Previous history. Patient seems in good spirits, no signs of active depression    Type 2 diabetes, uncontrolled, with renal manifestation: Diagnosis. A1c of 9.0. we'll start sliding scale and likely outpatient metformin.  Stable    CKD (chronic kidney disease), stage III: Likely from underlying hypertension and diabetes. No previous baseline. Started low-dose ACE inhibitor on 7/1.Marland Kitchen Monitoring renal function, creatinine has since improved and has stayed stable around 1.4  Sleep disturbance: Trying when necessary Ambien tonight  Code Status: full code  Family Communication: no family present. Sister from out of town  Disposition Plan: Discharge home once INR therapeutic, heart rate controlled and patient fully diuresed  Consultants:  cardiology  Procedures:  Echocardiogram: Difficult  study given atrial fibrillation. Markedly decreased ejection fraction of 25-30%  Antibiotics:  none  HPI/Subjective: Having some trouble sleeping.  Objective: Filed Vitals:   05/12/14 0952  BP: 139/94  Pulse: 96  Temp: 98.3 F (36.8 C)  Resp: 20    Intake/Output Summary (Last 24 hours) at 05/12/14 1238 Last data filed at 05/12/14 1129  Gross per 24 hour  Intake    480 ml  Output   2350 ml  Net  -1870 ml   Filed Weights   05/10/14 1900 05/11/14 0429 05/12/14 0644  Weight: 116.665 kg (257 lb 3.2 oz) 116.4 kg (256 lb 9.9 oz) 117.391 kg (258 lb 12.8 oz)    Exam:   General: alert and oriented x3, no acute distress  Cardiovascular: irregular rhythm, still with borderline tachycardia  Respiratory: Clear to auscultation bilaterally  Abdomen: soft, obese, nontender, positive bowel sounds  Musculoskeletal: No clubbing or cyanosis cyanosis, trace pitting edema bilaterally   Data Reviewed: Basic Metabolic Panel:  Recent Labs Lab 05/08/14 2040 05/09/14 0125 05/09/14 0311 05/10/14 0305 05/11/14 0426 05/12/14 0417  NA 139  --  134* 136* 141 139  K 4.7  --  4.2 3.3* 4.4 4.6  CL 101  --  96 98 101 99  CO2 22  --  22 24 30 27   GLUCOSE 162*  --  127* 134* 122* 124*  BUN 40*  --  37* 31* 27* 28*  CREATININE 1.95* 1.61* 1.66* 1.43* 1.48* 1.43*  CALCIUM 8.8  --  8.8 8.1* 8.4 9.1   Liver Function Tests:  Recent Labs Lab 05/09/14 0311 05/10/14 0305 05/11/14 0426 05/12/14 0417  AST 36 38* 38* 43*  ALT 42 41 37 38  ALKPHOS 29* 27* 26* 29*  BILITOT 0.6 0.5 0.3 0.4  PROT 6.4 6.0 6.0 6.5  ALBUMIN 3.4* 3.0* 3.0* 3.2*   No results found for this basename: LIPASE, AMYLASE,  in the last 168 hours No results found for this basename: AMMONIA,  in the last 168 hours CBC:  Recent Labs Lab 05/08/14 2040 05/09/14 0125 05/09/14 0311 05/10/14 0305 05/11/14 0426  WBC 10.0 9.8 9.3 8.8 8.1  NEUTROABS  --   --  6.5 6.4 5.4  HGB 13.5 13.1 12.9* 12.2* 12.2*  HCT 41.8  39.2 39.4 37.2* 38.3*  MCV 89.1 87.5 87.9 88.4 89.1  PLT 175 159 153 151 144*   Cardiac Enzymes:  Recent Labs Lab 05/08/14 2240 05/09/14 0125 05/09/14 1350  TROPONINI <0.30 <0.30 <0.30   BNP (last 3 results)  Recent Labs  05/08/14 2042  PROBNP 3681.0*   CBG:  Recent Labs Lab 05/11/14 1122 05/11/14 1641 05/11/14 2114 05/12/14 0614 05/12/14 1049  GLUCAP 142* 114* 186* 130* 147*    Recent Results (from the past 240 hour(s))  MRSA PCR SCREENING     Status: None   Collection Time    05/09/14  2:05 AM      Result Value Ref Range Status   MRSA by PCR NEGATIVE  NEGATIVE Final   Comment:            The GeneXpert MRSA Assay (FDA     approved for NASAL specimens     only), is one component of a     comprehensive MRSA colonization     surveillance program. It is not     intended to diagnose MRSA     infection nor to guide or     monitor treatment for     MRSA infections.     Studies: No results found.  Scheduled Meds: . furosemide  60 mg Intravenous Q12H  . insulin aspart  0-5 Units Subcutaneous QHS  . insulin aspart  0-9 Units Subcutaneous TID WC  . lisinopril  2.5 mg Oral Daily  . sodium chloride  3 mL Intravenous Q12H  . warfarin  10 mg Oral ONCE-1800  . Warfarin - Pharmacist Dosing Inpatient   Does not apply q1800  . zolpidem  5 mg Oral QHS   Continuous Infusions: . heparin 1,350 Units/hr (05/12/14 1141)    Principal Problem:   Acute systolic heart failure Active Problems:   Dyspnea   Atrial fibrillation with RVR   AKI (acute kidney injury)   Swelling of lower extremity   Depression   Type 2 diabetes, uncontrolled, with renal manifestation   CKD (chronic kidney disease), stage III    Time spent: 15 minutes   Hollice EspyKRISHNAN,Derica Leiber K  [facility] Pager [phone]. If 7PM-7AM, please contact night-coverage at [email or web address], password [email or web address] 05/12/2014, 12:38 PM  LOS: 4 days

## 2014-05-13 DIAGNOSIS — I8310 Varicose veins of unspecified lower extremity with inflammation: Secondary | ICD-10-CM

## 2014-05-13 DIAGNOSIS — G4733 Obstructive sleep apnea (adult) (pediatric): Secondary | ICD-10-CM | POA: Diagnosis present

## 2014-05-13 LAB — COMPREHENSIVE METABOLIC PANEL
ALBUMIN: 3 g/dL — AB (ref 3.5–5.2)
ALK PHOS: 29 U/L — AB (ref 39–117)
ALT: 37 U/L (ref 0–53)
ANION GAP: 14 (ref 5–15)
AST: 36 U/L (ref 0–37)
BILIRUBIN TOTAL: 0.3 mg/dL (ref 0.3–1.2)
BUN: 25 mg/dL — AB (ref 6–23)
CO2: 28 mEq/L (ref 19–32)
CREATININE: 1.21 mg/dL (ref 0.50–1.35)
Calcium: 8.8 mg/dL (ref 8.4–10.5)
Chloride: 99 mEq/L (ref 96–112)
GFR calc non Af Amer: 62 mL/min — ABNORMAL LOW (ref >90)
GFR, EST AFRICAN AMERICAN: 72 mL/min — AB (ref >90)
GLUCOSE: 136 mg/dL — AB (ref 70–99)
POTASSIUM: 3.2 meq/L — AB (ref 3.7–5.3)
Sodium: 141 mEq/L (ref 137–147)
Total Protein: 6.4 g/dL (ref 6.0–8.3)

## 2014-05-13 LAB — PROTIME-INR
INR: 1.48 (ref 0.00–1.49)
Prothrombin Time: 17.9 seconds — ABNORMAL HIGH (ref 11.6–15.2)

## 2014-05-13 LAB — CBC
HCT: 38.4 % — ABNORMAL LOW (ref 39.0–52.0)
Hemoglobin: 12.4 g/dL — ABNORMAL LOW (ref 13.0–17.0)
MCH: 28.6 pg (ref 26.0–34.0)
MCHC: 32.3 g/dL (ref 30.0–36.0)
MCV: 88.5 fL (ref 78.0–100.0)
Platelets: 148 10*3/uL — ABNORMAL LOW (ref 150–400)
RBC: 4.34 MIL/uL (ref 4.22–5.81)
RDW: 14.5 % (ref 11.5–15.5)
WBC: 7.3 10*3/uL (ref 4.0–10.5)

## 2014-05-13 LAB — GLUCOSE, CAPILLARY
GLUCOSE-CAPILLARY: 130 mg/dL — AB (ref 70–99)
GLUCOSE-CAPILLARY: 124 mg/dL — AB (ref 70–99)
Glucose-Capillary: 135 mg/dL — ABNORMAL HIGH (ref 70–99)
Glucose-Capillary: 149 mg/dL — ABNORMAL HIGH (ref 70–99)

## 2014-05-13 LAB — HEPARIN LEVEL (UNFRACTIONATED): HEPARIN UNFRACTIONATED: 0.37 [IU]/mL (ref 0.30–0.70)

## 2014-05-13 MED ORDER — DILTIAZEM HCL ER 60 MG PO CP12
60.0000 mg | ORAL_CAPSULE | Freq: Two times a day (BID) | ORAL | Status: DC
  Filled 2014-05-13 (×2): qty 1

## 2014-05-13 MED ORDER — DIGOXIN 0.25 MG/ML IJ SOLN
0.2500 mg | Freq: Once | INTRAMUSCULAR | Status: AC
  Administered 2014-05-13: 0.25 mg via INTRAVENOUS
  Filled 2014-05-13 (×2): qty 1

## 2014-05-13 MED ORDER — DIGOXIN 125 MCG PO TABS
0.1250 mg | ORAL_TABLET | Freq: Every day | ORAL | Status: DC
  Administered 2014-05-14 – 2014-05-16 (×3): 0.125 mg via ORAL
  Filled 2014-05-13 (×3): qty 1

## 2014-05-13 MED ORDER — TRIAMCINOLONE ACETONIDE 0.025 % EX CREA
TOPICAL_CREAM | Freq: Two times a day (BID) | CUTANEOUS | Status: DC
  Administered 2014-05-13 – 2014-05-14 (×4): via TOPICAL
  Administered 2014-05-15: 1 via TOPICAL
  Administered 2014-05-15 – 2014-05-16 (×2): via TOPICAL
  Filled 2014-05-13 (×2): qty 15

## 2014-05-13 MED ORDER — WARFARIN SODIUM 7.5 MG PO TABS
7.5000 mg | ORAL_TABLET | Freq: Once | ORAL | Status: AC
  Administered 2014-05-13: 7.5 mg via ORAL
  Filled 2014-05-13: qty 1

## 2014-05-13 NOTE — Progress Notes (Signed)
Patient ID: [name], male   DOB: 1950-11-30, 63 y.o.   MRN: [id number]   SUBJECTIVE:  Dyspnea improved, close to baseline.  HR now controlled.  OBJECTIVE:   Vitals:   Filed Vitals:   05/12/14 0952 05/12/14 1300 05/12/14 2007 05/13/14 0459  BP: 139/94 150/86 153/98 147/91  Pulse: 96 98 91 93  Temp: 98.3 F (36.8 C) 98.3 F (36.8 C) 97.9 F (36.6 C) 98 F (36.7 C)  TempSrc: Oral Oral Oral Oral  Resp: 20 20 18 18   Height:      Weight:    249 lb 9.6 oz (113.218 kg)  SpO2: 99% 98% 96% 98%   I&O's:    Intake/Output Summary (Last 24 hours) at 05/13/14 0825 Last data filed at 05/13/14 0500  Gross per 24 hour  Intake    480 ml  Output   4500 ml  Net  -4020 ml   TELEMETRY: Reviewed telemetry pt in AFib w/ RVR, rates in the 120-130's at rest     PHYSICAL EXAM General: Well developed, well nourished, in no acute distress, obese Head:   Normal cephalic and atramatic  Lungs:   Clear bilaterally to auscultation. Heart:   Irregular but not tachy, S1 S2  No JVD.   Abdomen: abdomen soft and non-tender Msk:  Back normal,  Normal strength and tone for age. Extremities: 1+ bilateral LE edema.   Neuro: Alert and oriented. Psych:  Normal affect, responds appropriately   LABS: Basic Metabolic Panel:  Recent Labs  40/98/1106/02/21 0417 05/13/14 0320  NA 139 141  K 4.6 3.2*  CL 99 99  CO2 27 28  GLUCOSE 124* 136*  BUN 28* 25*  CREATININE 1.43* 1.21  CALCIUM 9.1 8.8   Liver Function Tests:  Recent Labs  05/12/14 0417 05/13/14 0320  AST 43* 36  ALT 38 37  ALKPHOS 29* 29*  BILITOT 0.4 0.3  PROT 6.5 6.4  ALBUMIN 3.2* 3.0*   No results found for this basename: LIPASE, AMYLASE,  in the last 72 hours CBC:  Recent Labs  05/11/14 0426 05/13/14 0320  WBC 8.1 7.3  NEUTROABS 5.4  --   HGB 12.2* 12.4*  HCT 38.3* 38.4*  MCV 89.1 88.5  PLT 144* 148*   Cardiac Enzymes: No results found for this basename: CKTOTAL, CKMB, CKMBINDEX, TROPONINI,  in the last 72  hours BNP: No components found with this basename: POCBNP,  D-Dimer: No results found for this basename: DDIMER,  in the last 72 hours Hemoglobin A1C: No results found for this basename: HGBA1C,  in the last 72 hours Fasting Lipid Panel: No results found for this basename: CHOL, HDL, LDLCALC, TRIG, CHOLHDL, LDLDIRECT,  in the last 72 hours Thyroid Function Tests: No results found for this basename: TSH, T4TOTAL, FREET3, T3FREE, THYROIDAB,  in the last 72 hours Anemia Panel: No results found for this basename: VITAMINB12, FOLATE, FERRITIN, TIBC, IRON, RETICCTPCT,  in the last 72 hours Coag Panel:   Lab Results  Component Value Date   INR 1.48 05/13/2014   INR 1.16 05/12/2014   INR 1.16 05/11/2014    RADIOLOGY: Ct Abdomen Wo Contrast  05/09/2014   CLINICAL DATA:  Left-sided abdominal pain.  EXAM: CT ABDOMEN WITHOUT CONTRAST  TECHNIQUE: Multidetector CT imaging of the abdomen was performed following the standard protocol without IV contrast.  COMPARISON:  None.  FINDINGS: Images through the lung bases show tiny right pleural effusion. Mild ascites visualized in the abdomen and diffuse body wall edema is also seen  within the subcutaneous tissues.  Noncontrast images of the liver, pancreas, spleen, adrenal glands, and kidneys are unremarkable. No evidence hydronephrosis. No abdominal soft tissue masses or lymphadenopathy identified. No evidence of focal inflammatory process. The visualized intra-abdominal bowel loops are nondilated.  IMPRESSION: Mild abdominal ascites and diffuse abdominal wall edema. Tiny right pleural effusion also noted.  No focal soft tissue mass or inflammatory process identified.   Electronically Signed   By: Myles Rosenthal M.D.   On: 05/09/2014 11:33   Dg Chest 2 View  05/08/2014   CLINICAL DATA:  Leg swelling and numbness.  EXAM: CHEST  2 VIEW  COMPARISON:  None.  FINDINGS: Mild to moderate cardiomegaly. No concerning upper mediastinal contour change. Mild hypoaeration with  interstitial crowding at the bases. No consolidation, edema, effusion, or pneumothorax.  IMPRESSION: Cardiomegaly without failure.   Electronically Signed   By: Tiburcio Pea M.D.   On: 05/08/2014 23:19   US Renal  05/09/2014   CLINICAL DATA:  Renal insufficiency  EXAM: RETROPERITONEAL ULTRASOUND  COMPARISON:  None.  FINDINGS: Right Kidney:  Length: 11.4 cm. Echogenicity and renal cortical thickness within normal limits. No mass or hydronephrosis visualized. There is no perinephric fluid. There is no sonographically demonstrable calculus or ureterectasis.  Left Kidney:  Length: 12.3 cm. Echogenicity and renal cortical thickness within normal limits. No mass or hydronephrosis visualized. There is no perinephric fluid. There is no sonographically demonstrable calculus or ureterectasis.  Bladder:  Appears normal for degree of bladder distention.  There is moderate generalized ascites.  IMPRESSION: Moderate generalized ascites.  Study otherwise unremarkable.   Electronically Signed   By: Bretta Bang M.D.   On: 05/09/2014 08:57      ASSESSMENT:  1. Atrial fib with a controlled VR 2. Acute systolic heart failure, probably tachy mediated 3. New initiation of coumadin/heparin 4. Sleep apnea - he is no longer wearing a CPAP mask and is interested in pursuing this.  PLAN:  continue metoprolol to 100 q 12hours for rate control.  Switch to po digoxin today.    Cardiomyopathy: Appears to be volume overloaded but much improved.  Continue Lasix IV.    Spoke at length about avoiding salt.  Spoke about regular walking and gradually increasing duration.  Coumadin for stroke prevention. INR has started to go out. Will continue coumadin.  Lewayne Bunting, MD  05/13/2014  8:25 AM

## 2014-05-13 NOTE — Progress Notes (Signed)
ANTICOAGULATION CONSULT NOTE - Follow Up Consult  Pharmacy Consult for Heparin/Coumadin Indication: atrial fibrillation  Labs:  Recent Labs  05/11/14 0426 05/12/14 0417 05/13/14 0320  HGB 12.2*  --  12.4*  HCT 38.3*  --  38.4*  PLT 144*  --  148*  LABPROT 14.8 14.8 17.9*  INR 1.16 1.16 1.48  HEPARINUNFRC 0.48 0.39 0.37  CREATININE 1.48* 1.43* 1.21     Assessment:  63yo male with therapeutic heparin level = 0.37 on heparin drip rate 1350 units/hr for new onset atrial fibrilation with RVR.  Coumadin started on 05/10/14.  Today INR is 1.48 increasing toward goal. CBC today , Hgb 12.4, stable, pltc 148K, stable. No bleeding noted. Good diet intake (100 % of meals).   Plan:  Continue IV heparin drip at 1350 units/hr Give Coumadin 7.5 mg today Daily heparin level, PT/INR and CBC.  [name], RPh Clinical Pharmacist Pager: [phone] 05/13/2014,11:14 AM

## 2014-05-13 NOTE — Progress Notes (Signed)
[facility] PROGRESS NOTE  Wayne BothJoseph A Grabill ZOX:[id number] DOB: 04/21/1951 DOA: 05/08/2014 PCP: No PCP Per Patient  Assessment/Plan: Principal Problem:   Acute systolic heart failure: markedly depressed ejection fraction.  Have started beta blocker and ACE inhibitor plus Lasix. At this point has diuresed almost 12 L Plan is to recheck echocardiogram as outpatient in several months.    Active Problems:    Dyspnea: Secondary to heart failure and atrial fibrillation. Resolved. Off of oxygen    Atrial fibrillation with RVR: Weaned off of Cardizem drip. Currently on metoprolol twice a day. Heart rate finally below 100 and adding digoxin. Adding by mouth Cardizem as well. INR continues to trend up on Coumadin  Obesity: Patient meets criteria With BMI greater than 30.  Ascites: Initially seen on abdominal ultrasound and confirmed with CT. Unclear etiology but likely in the setting of heart failure, this may be cause. Liver looked okay by a noncontrast CT. Transaminases appear relatively stable. Would not workup further at this time, unless it worsens    Depression: Previous history. Patient seems in good spirits, no signs of active depression    Type 2 diabetes, uncontrolled, with renal manifestation: Diagnosis. A1c of 9.0. we'll start sliding scale and likely outpatient metformin.  Stable    CKD (chronic kidney disease), stage III: Likely from underlying hypertension and diabetes. No previous baseline. Started low-dose ACE inhibitor on 7/1.Marland Kitchen. Monitoring renal function, creatinine has since improved and has stayed stable around 1.2  Obstructive sleep apnea: Patient states he had a sleep study workup years ago recommended he be on CPAP. He reports the study was done here, but am unable to find in review of records. Will speak to [facility] tomorrow in the office is open to try to get this down.    Code Status: full code  Family Communication: no family present. Sister from out of  town  Disposition Plan: Discharge home once INR therapeutic and patient fully diuresed. Likely in the next few days  Consultants:  cardiology  Procedures:  Echocardiogram: Difficult study given atrial fibrillation. Markedly decreased ejection fraction of 25-30%  Antibiotics:  none  HPI/Subjective: Patient doing okay. Complains of some lower extremity itching  Objective: Filed Vitals:   05/13/14 0900  BP: 134/78  Pulse: 96  Temp:   Resp: 18    Intake/Output Summary (Last 24 hours) at 05/13/14 1444 Last data filed at 05/13/14 1100  Gross per 24 hour  Intake    480 ml  Output   4875 ml  Net  -4395 ml   Filed Weights   05/11/14 0429 05/12/14 0644 05/13/14 0459  Weight: 116.4 kg (256 lb 9.9 oz) 117.391 kg (258 lb 12.8 oz) 113.218 kg (249 lb 9.6 oz)    Exam:   General: alert and oriented x3, no acute distress  Cardiovascular: irregular rhythm, still with borderline tachycardia  Respiratory: Clear to auscultation bilaterally  Abdomen: soft, obese, nontender, positive bowel sounds  Musculoskeletal: No clubbing or cyanosis cyanosis, 1+ pitting edema bilaterally. Papular rash from the knees down bilaterally Data Reviewed: Basic Metabolic Panel:  Recent Labs Lab 05/09/14 0311 05/10/14 0305 05/11/14 0426 05/12/14 0417 05/13/14 0320  NA 134* 136* 141 139 141  K 4.2 3.3* 4.4 4.6 3.2*  CL 96 98 101 99 99  CO2 22 24 30 27 28   GLUCOSE 127* 134* 122* 124* 136*  BUN 37* 31* 27* 28* 25*  CREATININE 1.66* 1.43* 1.48* 1.43* 1.21  CALCIUM 8.8 8.1* 8.4 9.1 8.8   Liver Function  Tests:  Recent Labs Lab 05/09/14 0311 05/10/14 0305 05/11/14 0426 05/12/14 0417 05/13/14 0320  AST 36 38* 38* 43* 36  ALT 42 41 37 38 37  ALKPHOS 29* 27* 26* 29* 29*  BILITOT 0.6 0.5 0.3 0.4 0.3  PROT 6.4 6.0 6.0 6.5 6.4  ALBUMIN 3.4* 3.0* 3.0* 3.2* 3.0*   No results found for this basename: LIPASE, AMYLASE,  in the last 168 hours No results found for this basename: AMMONIA,  in  the last 168 hours CBC:  Recent Labs Lab 05/09/14 0125 05/09/14 0311 05/10/14 0305 05/11/14 0426 05/13/14 0320  WBC 9.8 9.3 8.8 8.1 7.3  NEUTROABS  --  6.5 6.4 5.4  --   HGB 13.1 12.9* 12.2* 12.2* 12.4*  HCT 39.2 39.4 37.2* 38.3* 38.4*  MCV 87.5 87.9 88.4 89.1 88.5  PLT 159 153 151 144* 148*   Cardiac Enzymes:  Recent Labs Lab 05/08/14 2240 05/09/14 0125 05/09/14 1350  TROPONINI <0.30 <0.30 <0.30   BNP (last 3 results)  Recent Labs  05/08/14 2042  PROBNP 3681.0*   CBG:  Recent Labs Lab 05/12/14 1049 05/12/14 1610 05/12/14 2112 05/13/14 0557 05/13/14 1133  GLUCAP 147* 144* 120* 135* 130*    Recent Results (from the past 240 hour(s))  MRSA PCR SCREENING     Status: None   Collection Time    05/09/14  2:05 AM      Result Value Ref Range Status   MRSA by PCR NEGATIVE  NEGATIVE Final   Comment:            The GeneXpert MRSA Assay (FDA     approved for NASAL specimens     only), is one component of a     comprehensive MRSA colonization     surveillance program. It is not     intended to diagnose MRSA     infection nor to guide or     monitor treatment for     MRSA infections.     Studies: No results found.  Scheduled Meds: . furosemide  60 mg Intravenous Q12H  . insulin aspart  0-5 Units Subcutaneous QHS  . insulin aspart  0-9 Units Subcutaneous TID WC  . lisinopril  2.5 mg Oral Daily  . sodium chloride  3 mL Intravenous Q12H  . warfarin  7.5 mg Oral ONCE-1800  . Warfarin - Pharmacist Dosing Inpatient   Does not apply q1800  . zolpidem  5 mg Oral QHS   Continuous Infusions: . heparin 1,350 Units/hr (05/13/14 3845)    Principal Problem:   Acute systolic heart failure Active Problems:   Dyspnea   Atrial fibrillation with RVR   AKI (acute kidney injury)   Swelling of lower extremity   Depression   Type 2 diabetes, uncontrolled, with renal manifestation   CKD (chronic kidney disease), stage III    Time spent: 25  minutes   Hollice Espy  [facility] Pager [phone]. If 7PM-7AM, please contact night-coverage at [email or web address], password [facility] 05/13/2014, 2:44 PM  LOS: 5 days

## 2014-05-14 DIAGNOSIS — I517 Cardiomegaly: Secondary | ICD-10-CM

## 2014-05-14 LAB — CBC
HEMATOCRIT: 39.9 % (ref 39.0–52.0)
Hemoglobin: 12.8 g/dL — ABNORMAL LOW (ref 13.0–17.0)
MCH: 28.4 pg (ref 26.0–34.0)
MCHC: 32.1 g/dL (ref 30.0–36.0)
MCV: 88.5 fL (ref 78.0–100.0)
PLATELETS: 156 10*3/uL (ref 150–400)
RBC: 4.51 MIL/uL (ref 4.22–5.81)
RDW: 14.3 % (ref 11.5–15.5)
WBC: 8.1 10*3/uL (ref 4.0–10.5)

## 2014-05-14 LAB — PROTIME-INR
INR: 1.88 — AB (ref 0.00–1.49)
Prothrombin Time: 21.6 seconds — ABNORMAL HIGH (ref 11.6–15.2)

## 2014-05-14 LAB — GLUCOSE, CAPILLARY
GLUCOSE-CAPILLARY: 120 mg/dL — AB (ref 70–99)
GLUCOSE-CAPILLARY: 175 mg/dL — AB (ref 70–99)
GLUCOSE-CAPILLARY: 122 mg/dL — AB (ref 70–99)
Glucose-Capillary: 169 mg/dL — ABNORMAL HIGH (ref 70–99)

## 2014-05-14 LAB — COMPREHENSIVE METABOLIC PANEL
ALBUMIN: 3.3 g/dL — AB (ref 3.5–5.2)
ALK PHOS: 29 U/L — AB (ref 39–117)
ALT: 37 U/L (ref 0–53)
ANION GAP: 15 (ref 5–15)
AST: 28 U/L (ref 0–37)
BUN: 18 mg/dL (ref 6–23)
CHLORIDE: 97 meq/L (ref 96–112)
CO2: 30 mEq/L (ref 19–32)
Calcium: 8.7 mg/dL (ref 8.4–10.5)
Creatinine, Ser: 1.13 mg/dL (ref 0.50–1.35)
GFR calc Af Amer: 78 mL/min — ABNORMAL LOW (ref >90)
GFR calc non Af Amer: 67 mL/min — ABNORMAL LOW (ref >90)
Glucose, Bld: 106 mg/dL — ABNORMAL HIGH (ref 70–99)
POTASSIUM: 3.2 meq/L — AB (ref 3.7–5.3)
Sodium: 142 mEq/L (ref 137–147)
Total Bilirubin: 0.5 mg/dL (ref 0.3–1.2)
Total Protein: 6.6 g/dL (ref 6.0–8.3)

## 2014-05-14 LAB — HEPARIN LEVEL (UNFRACTIONATED)
HEPARIN UNFRACTIONATED: 0.34 [IU]/mL (ref 0.30–0.70)
Heparin Unfractionated: 0.26 IU/mL — ABNORMAL LOW (ref 0.30–0.70)

## 2014-05-14 MED ORDER — WARFARIN SODIUM 7.5 MG PO TABS
7.5000 mg | ORAL_TABLET | Freq: Once | ORAL | Status: AC
  Administered 2014-05-14: 7.5 mg via ORAL
  Filled 2014-05-14: qty 1

## 2014-05-14 MED ORDER — METOPROLOL TARTRATE 100 MG PO TABS
100.0000 mg | ORAL_TABLET | Freq: Two times a day (BID) | ORAL | Status: DC
  Administered 2014-05-14 – 2014-05-16 (×5): 100 mg via ORAL
  Filled 2014-05-14 (×6): qty 1

## 2014-05-14 MED ORDER — SPIRONOLACTONE 25 MG PO TABS
25.0000 mg | ORAL_TABLET | Freq: Every day | ORAL | Status: DC
  Administered 2014-05-14 – 2014-05-15 (×2): 25 mg via ORAL
  Filled 2014-05-14 (×3): qty 1

## 2014-05-14 MED ORDER — POTASSIUM CHLORIDE CRYS ER 20 MEQ PO TBCR
40.0000 meq | EXTENDED_RELEASE_TABLET | Freq: Once | ORAL | Status: AC
  Administered 2014-05-14: 40 meq via ORAL
  Filled 2014-05-14: qty 2

## 2014-05-14 NOTE — Progress Notes (Signed)
Heart Failure Navigator Consult Note  Presentation: [name] presented with dyspnea, new onset afib w/ RVR, AKI, LE swelling   History of Present Illness:This is a 63 y.o. year old male with no known prior medical history presenting with dyspnea, new onset afib w/ rvr, AKI, LE swelling. Pt has not seen a doctor in > 10 years. States that he has had progressive LE swelling and dyspnea over the past 2 weeks. Denies increased salt intake or NSAID use. Denies drug/ETOH abuse. Has had to sleep with pillows. Worsening swelling in LEs. No CP, nausea. Mild orthopnea.  Presented to ER today in Afib 2/ RVR w/ HR in 110s. Otherwise hemodynamically stable. Started on a dilt drip. Pro BNP 3700. Cr 1.95, BUN 40. CXR cardiomegaly w/ failure    History reviewed. No pertinent past medical history.  History   Social History  . Marital Status: Divorced    Spouse Name: N/A    Number of Children: N/A  . Years of Education: N/A   Social History Main Topics  . Smoking status: Former Games developer  . Smokeless tobacco: None  . Alcohol Use: No  . Drug Use: No  . Sexual Activity: None   Other Topics Concern  . None   Social History Narrative  . None    ECHO:Study Conclusions--05/09/14  - Left ventricle: The cavity size was at the upper limits of normal. Wall thickness was increased in a pattern of mild to moderate LVH. Systolic function was severely reduced. The estimated ejection fraction was in the range of 25% to 30%. Diffuse hypokinesis. The study is not technically sufficient to allow evaluation of LV diastolic function - rhythm appears to be atrial fibrillation. - Mitral valve: Mildly calcified annulus. There was trivial regurgitation. - Left atrium: The atrium was severely dilated. - Right ventricle: The cavity size was mildly dilated. Systolic function was mildly to moderately reduced. - Right atrium: The atrium was moderately dilated. Central venous pressure (est): 15 mm Hg. - Atrial  septum: No defect or patent foramen ovale was identified. - Tricuspid valve: There was mild regurgitation. - Pulmonary arteries: PA peak pressure: 48 mm Hg (S). - Pericardium, extracardiac: A trivial pericardial effusion was identified posterior to the heart.  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 Indeterminate diastolic function. Severe left atrial enlargement. MIld RV enlargement with reduced contraction. Mild tricuspid regurgitation with PASP 48 mmHg and elevated CVP. Trivial pericardial effusion.  BNP    Component Value Date/Time   PROBNP 3681.0* 05/08/2014 2042    Education Assessment and Provision:  Detailed education and instructions provided on heart failure disease management including the following:  Signs and symptoms of Heart Failure When to call the physician Importance of daily weights Low sodium diet Fluid restriction Medication management Anticipated future follow-up appointments  Patient education given on each of the above topics.  Patient acknowledges understanding and acceptance of all instructions.  [name] was very interested in learning about his new diagnosis of Heart Failure.  He asked many pertinent questions and was able to teach back topics described above.  He lives with a "roommmate" and often eats at [employer].  He says that will be a problem as they do not provide low sodium "healthy meals".  He does admit that finances will be a barrier to his healthcare- however he is interested in establishing care with a provider.  Education Materials:  "Living  Better With Heart Failure" Booklet, Daily Weight Tracker Tool   High Risk Criteria for Readmission and/or Poor Patient Outcomes:  (Recommend Follow-up with [facility])---Yes outpatient follow-up recommended   EF <30%- Yes 25-30%  2 or more  admissions in 6 months- No  Difficult social situation- Yes--no local family and very little other support  Demonstrates medication noncompliance- No--however has not had medical care in years.   Barriers of Care:   New diagnosis --Knowledge of HF and recommendations, finances  Discharge Planning:   Plans to discharge to home with "roommate".  He will benefit with outpatient follow-up with the AHF clinic given his financial situation and lack of resources.  He will need ongoing education and compliance reinforcement.  He has an appt with AHF clinic on 05/21/14 at 1:45pm.

## 2014-05-14 NOTE — Progress Notes (Signed)
[facility] PROGRESS NOTE  [name] HBZ:[id number] DOB: 1951-01-11 DOA: 05/08/2014 PCP: No PCP Per Patient  Assessment/Plan: Principal Problem:   Acute systolic heart failure: markedly depressed ejection fraction.  Have started beta blocker and ACE inhibitor plus Lasix. At this point has diuresed over 17 L. Plan is to recheck echocardiogram as outpatient in several months. Cardiology tazobactam. We'll discuss with them given patient's financial concerns and already on 60 medications.   Active Problems:    Dyspnea: Secondary to heart failure and atrial fibrillation. Resolved. Off of oxygen    Atrial fibrillation with RVR: Weaned off of Cardizem drip. INR almost therapeutic. metoprolol started, but only symptom in order for one dose, will now order twice a day. Continue digoxin. Heart rate staying below 100.  Obesity: Patient meets criteria With BMI greater than 30.  Ascites: Initially seen on abdominal ultrasound and confirmed with CT. Unclear etiology but likely in the setting of heart failure, this may be cause. Liver looked okay by a noncontrast CT. Transaminases appear relatively stable. Would not workup further at this time, unless it worsens    Depression: Previous history. Patient seems in good spirits, no signs of active depression    Type 2 diabetes, uncontrolled, with renal manifestation: Diagnosis. A1c of 9.0. we'll start sliding scale and likely outpatient metformin.  Stable    CKD (chronic kidney disease), stage III: Likely from underlying hypertension and diabetes. No previous baseline. Started low-dose ACE inhibitor on 7/1.Marland Kitchen Monitoring renal function, creatinine has since improved and has stayed stable around 1.2  Obstructive sleep apnea: Patient states he had a sleep study workup years ago recommended he be on CPAP. He reports the study was done here, but am unable to find in review of records. Will speak to [facility] tomorrow in the office is open to  try to get this down.  Code Status: full code  Family Communication: no family present. Sister from out of town  Disposition Plan: Discharge home once INR therapeutic and patient fully diuresed. Possibly tomorrow  Consultants:  cardiology  Procedures:  Echocardiogram: Difficult study given atrial fibrillation. Markedly decreased ejection fraction of 25-30%  Antibiotics:  none  HPI/Subjective: Patient doing okay. Has many questions about his exercise plan and food restrictions. We had an extensive discussion I told him that he really needs to first focus on being comfortable taking his medications, weighing himself daily and seeing his doctor. Once these things have an established behavior pattern, then he can focus on exercise. Once that has been done, then he can focus on food.  Objective: Filed Vitals:   05/14/14 1223  BP: 160/88  Pulse:   Temp:   Resp:     Intake/Output Summary (Last 24 hours) at 05/14/14 1343 Last data filed at 05/14/14 1309  Gross per 24 hour  Intake    960 ml  Output   6100 ml  Net  -5140 ml   Filed Weights   05/12/14 0644 05/13/14 0459 05/14/14 0617  Weight: 117.391 kg (258 lb 12.8 oz) 113.218 kg (249 lb 9.6 oz) 108.818 kg (239 lb 14.4 oz)    Exam:   General: alert and oriented x3, no acute distress  Cardiovascular: irregular rhythm, rate controlled  Respiratory: clear auscultation bilaterally  Abdomen: soft, obese, nontender, positive bowel sounds  Musculoskeletal: No clubbing or cyanosis cyanosis, 1+ pitting edema bilaterally. Papular rash from the knees down bilaterally-improved from yesterday Data Reviewed: Basic Metabolic Panel:  Recent Labs Lab 05/10/14 0305 05/11/14 0426 05/12/14 0417 05/13/14  0320 05/14/14 0228  NA 136* 141 139 141 142  K 3.3* 4.4 4.6 3.2* 3.2*  CL 98 101 99 99 97  CO2 24 30 27 28 30   GLUCOSE 134* 122* 124* 136* 106*  BUN 31* 27* 28* 25* 18  CREATININE 1.43* 1.48* 1.43* 1.21 1.13  CALCIUM 8.1*  8.4 9.1 8.8 8.7   Liver Function Tests:  Recent Labs Lab 05/10/14 0305 05/11/14 0426 05/12/14 0417 05/13/14 0320 05/14/14 0228  AST 38* 38* 43* 36 28  ALT 41 37 38 37 37  ALKPHOS 27* 26* 29* 29* 29*  BILITOT 0.5 0.3 0.4 0.3 0.5  PROT 6.0 6.0 6.5 6.4 6.6  ALBUMIN 3.0* 3.0* 3.2* 3.0* 3.3*   No results found for this basename: LIPASE, AMYLASE,  in the last 168 hours No results found for this basename: AMMONIA,  in the last 168 hours CBC:  Recent Labs Lab 05/09/14 0311 05/10/14 0305 05/11/14 0426 05/13/14 0320 05/14/14 0228  WBC 9.3 8.8 8.1 7.3 8.1  NEUTROABS 6.5 6.4 5.4  --   --   HGB 12.9* 12.2* 12.2* 12.4* 12.8*  HCT 39.4 37.2* 38.3* 38.4* 39.9  MCV 87.9 88.4 89.1 88.5 88.5  PLT 153 151 144* 148* 156   Cardiac Enzymes:  Recent Labs Lab 05/08/14 2240 05/09/14 0125 05/09/14 1350  TROPONINI <0.30 <0.30 <0.30   BNP (last 3 results)  Recent Labs  05/08/14 2042  PROBNP 3681.0*   CBG:  Recent Labs Lab 05/13/14 1133 05/13/14 1613 05/13/14 2123 05/14/14 0624 05/14/14 1150  GLUCAP 130* 124* 149* 120* 175*    Recent Results (from the past 240 hour(s))  MRSA PCR SCREENING     Status: None   Collection Time    05/09/14  2:05 AM      Result Value Ref Range Status   MRSA by PCR NEGATIVE  NEGATIVE Final   Comment:            The GeneXpert MRSA Assay (FDA     approved for NASAL specimens     only), is one component of a     comprehensive MRSA colonization     surveillance program. It is not     intended to diagnose MRSA     infection nor to guide or     monitor treatment for     MRSA infections.     Studies: No results found.  Scheduled Meds: . digoxin  0.125 mg Oral Daily  . furosemide  60 mg Intravenous Q12H  . insulin aspart  0-5 Units Subcutaneous QHS  . insulin aspart  0-9 Units Subcutaneous TID WC  . lisinopril  2.5 mg Oral Daily  . metoprolol tartrate  100 mg Oral BID  . sodium chloride  3 mL Intravenous Q12H  . spironolactone  25 mg  Oral Daily  . triamcinolone   Topical BID  . warfarin  7.5 mg Oral ONCE-1800  . Warfarin - Pharmacist Dosing Inpatient   Does not apply q1800  . zolpidem  5 mg Oral QHS   Continuous Infusions: . heparin 1,500 Units/hr (05/14/14 0507)    Principal Problem:   Acute systolic heart failure Active Problems:   Dyspnea   Atrial fibrillation with RVR   AKI (acute kidney injury)   Swelling of lower extremity   Depression   Type 2 diabetes, uncontrolled, with renal manifestation   CKD (chronic kidney disease), stage III    Time spent: 15 minutes   Hollice Espy  [facility] Pager [phone]. If 7PM-7AM,  please contact night-coverage at [email or web address], password [email or web address] 05/14/2014, 1:43 PM  LOS: 6 days

## 2014-05-14 NOTE — Progress Notes (Signed)
ANTICOAGULATION CONSULT NOTE - Follow Up Consult  Pharmacy Consult for Heparin/Coumadin Indication: atrial fibrillation  Labs:  Recent Labs  05/12/14 0417 05/13/14 0320 05/14/14 0228 05/14/14 1055  HGB  --  12.4* 12.8*  --   HCT  --  38.4* 39.9  --   PLT  --  148* 156  --   LABPROT 14.8 17.9* 21.6*  --   INR 1.16 1.48 1.88*  --   HEPARINUNFRC 0.39 0.37 0.26* 0.34  CREATININE 1.43* 1.21 1.13  --     Assessment:  63 yo male on heparin to Coumadin bridge for new onset Afib. Heparin level is therapeutic at 0.34 on 1500 units/hr. INR is subtherapeutic at 1.88 but trending up to goal. No bleeding noted, CBC is stable.   Plan:  Continue IV heparin drip at 1500 units/hr Coumadin 7.5 mg PO today Daily heparin level, PT/INR and CBC  [facility], [address].D., BCPS Clinical Pharmacist Pager: [phone] 05/14/2014 11:46 AM

## 2014-05-14 NOTE — Progress Notes (Signed)
ANTICOAGULATION CONSULT NOTE - Follow Up Consult  Pharmacy Consult for heparin Indication: atrial fibrillation  Labs:  Recent Labs  05/12/14 0417 05/13/14 0320 05/14/14 0228  HGB  --  12.4* 12.8*  HCT  --  38.4* 39.9  PLT  --  148* 156  LABPROT 14.8 17.9* 21.6*  INR 1.16 1.48 1.88*  HEPARINUNFRC 0.39 0.37 0.26*  CREATININE 1.43* 1.21 1.13    Assessment: 62yo male now subtherapeutic on heparin after several levels at goal though had been trending down.  Goal of Therapy:  Heparin level 0.3-0.7 units/ml   Plan:  Will increase heparin gtt by ~1 unit/kg/hr to 1500 units/hr and check level in 6hr.  Vernard Gambles, PharmD, BCPS  05/14/2014,4:43 AM

## 2014-05-14 NOTE — Progress Notes (Signed)
Patient Name: [name] Date of Encounter: 05/14/2014     Principal Problem:   Acute systolic heart failure Active Problems:   Dyspnea   Atrial fibrillation with RVR   AKI (acute kidney injury)   Swelling of lower extremity   Depression   Type 2 diabetes, uncontrolled, with renal manifestation   CKD (chronic kidney disease), stage III   Obesity (BMI 30-39.9)   Ascites   Obstructive sleep apnea   Acute venous stasis dermatitis of both legs    SUBJECTIVE  Doing well. Has a lot of questions about medications, diet and exercise. Will likely be able to go home tomorrow.   CURRENT MEDS . digoxin  0.125 mg Oral Daily  . furosemide  60 mg Intravenous Q12H  . insulin aspart  0-5 Units Subcutaneous QHS  . insulin aspart  0-9 Units Subcutaneous TID WC  . lisinopril  2.5 mg Oral Daily  . sodium chloride  3 mL Intravenous Q12H  . triamcinolone   Topical BID  . Warfarin - Pharmacist Dosing Inpatient   Does not apply q1800  . zolpidem  5 mg Oral QHS    OBJECTIVE  Filed Vitals:   05/13/14 0900 05/13/14 1400 05/13/14 2033 05/14/14 0617  BP: 134/78 150/79 171/89 149/80  Pulse: 96 91 93 97  Temp:  98 F (36.7 C) 97.4 F (36.3 C) 97.2 F (36.2 C)  TempSrc:  Oral Oral Oral  Resp: 18 20 19 18   Height:      Weight:    239 lb 14.4 oz (108.818 kg)  SpO2: 98% 98% 99% 99%    Intake/Output Summary (Last 24 hours) at 05/14/14 1012 Last data filed at 05/14/14 0835  Gross per 24 hour  Intake    720 ml  Output   6700 ml  Net  -5980 ml   Filed Weights   05/12/14 0644 05/13/14 0459 05/14/14 0617  Weight: 258 lb 12.8 oz (117.391 kg) 249 lb 9.6 oz (113.218 kg) 239 lb 14.4 oz (108.818 kg)    PHYSICAL EXAM  General: alert and oriented x3, no acute distress Cardiovascular: irregular rhythm, still with borderline tachycardia Respiratory: Clear to auscultation bilaterally Abdomen: soft, obese, nontender, positive bowel sounds Musculoskeletal: No clubbing or cyanosis cyanosis, 1+  pitting edema bilaterally. Papular rash from the knees down bilaterally   Accessory Clinical Findings  CBC  Recent Labs  05/13/14 0320 05/14/14 0228  WBC 7.3 8.1  HGB 12.4* 12.8*  HCT 38.4* 39.9  MCV 88.5 88.5  PLT 148* 156   Basic Metabolic Panel  Recent Labs  05/13/14 0320 05/14/14 0228  NA 141 142  K 3.2* 3.2*  CL 99 97  CO2 28 30  GLUCOSE 136* 106*  BUN 25* 18  CREATININE 1.21 1.13  CALCIUM 8.8 8.7   Liver Function Tests  Recent Labs  05/13/14 0320 05/14/14 0228  AST 36 28  ALT 37 37  ALKPHOS 29* 29*  BILITOT 0.3 0.5  PROT 6.4 6.6  ALBUMIN 3.0* 3.3*    TELE  Not on tele  Radiology/Studies  Ct Abdomen Wo Contrast  2014-06-05   CLINICAL DATA:  Left-sided abdominal pain.  EXAM: CT ABDOMEN WITHOUT CONTRAST  TECHNIQUE: Multidetector CT imaging of the abdomen was performed following the standard protocol without IV contrast.  COMPARISON:  None.  FINDINGS: Images through the lung bases show tiny right pleural effusion. Mild ascites visualized in the abdomen and diffuse body wall edema is also seen within the subcutaneous tissues.  Noncontrast images of  the liver, pancreas, spleen, adrenal glands, and kidneys are unremarkable. No evidence hydronephrosis. No abdominal soft tissue masses or lymphadenopathy identified. No evidence of focal inflammatory process. The visualized intra-abdominal bowel loops are nondilated.  IMPRESSION: Mild abdominal ascites and diffuse abdominal wall edema. Tiny right pleural effusion also noted.  No focal soft tissue mass or inflammatory process identified.   Electronically Signed   By: Myles RosenthalJohn  Stahl M.D.   On: 05/09/2014 11:33   Dg Chest 2 View  05/08/2014   CLINICAL DATA:  Leg swelling and numbness.  EXAM: CHEST  2 VIEW  COMPARISON:  None.  FINDINGS: Mild to moderate cardiomegaly. No concerning upper mediastinal contour change. Mild hypoaeration with interstitial crowding at the bases. No consolidation, edema, effusion, or pneumothorax.   IMPRESSION: Cardiomegaly without failure.   Electronically Signed   By: Tiburcio PeaJonathan  Watts M.D.   On: 05/08/2014 23:19   [place] Renal  05/09/2014   CLINICAL DATA:  Renal insufficiency  EXAM: RETROPERITONEAL ULTRASOUND  COMPARISON:  None.  FINDINGS: Right Kidney:  Length: 11.4 cm. Echogenicity and renal cortical thickness within normal limits. No mass or hydronephrosis visualized. There is no perinephric fluid. There is no sonographically demonstrable calculus or ureterectasis.  Left Kidney:  Length: 12.3 cm. Echogenicity and renal cortical thickness within normal limits. No mass or hydronephrosis visualized. There is no perinephric fluid. There is no sonographically demonstrable calculus or ureterectasis.  Bladder:  Appears normal for degree of bladder distention.  There is moderate generalized ascites.  IMPRESSION: Moderate generalized ascites.  Study otherwise unremarkable.   Electronically Signed   By: Bretta BangWilliam  Woodruff M.D.   On: 05/09/2014 08:57    ASSESSMENT AND PLAN 63 yo man with no known PMH [facility] with dyspnea and leg swelling and found to have elevated BNP and atrial fibrillation with RVR and renal failure.   Atrial fib with a controlled VR -- Switched to po digoxin yesterday. Metoprolol discontinue for unclear reason. I spoke with Dr. Rae RoamKrisnan- we will resume 100mg  BID. -- New initiation of coumadin/heparin - INR 1.88 this AM per pharmacy. -- Not on telemetry. HRs had been well controlled.   Acute systolic heart failure, probably tachy mediated  -- On Lasix 60mg  IV BID. Neg 17L, down 16 lbs. Creat stable @1 .13 -- Continue on BB and ACE  Ascites: Initially seen on abdominal ultrasound and confirmed with CT. Unclear etiology but likely in the setting of heart failure, this may be cause. Liver looked okay by a noncontrast CT. Transaminases appear relatively stable. Would not workup further at this time, unless it worsens  Sleep apnea - he is no longer wearing a CPAP mask  and is interested in pursuing this.   Hypokalemia- K 3.2. Will supp  Type 2 diabetes- uncontrolled, with renal manifestation: Diagnosis. A1c of 9.0. we'll start sliding scale and likely outpatient metformin per  IM  Dispo- once INR therapeutic and at dry weight he will be able to go home with close follow up  SignedThereasa Parkin, STERN, Eaden Hettinger PA-C  Pager [phone]

## 2014-05-14 NOTE — Progress Notes (Addendum)
Patient seen and examined and agree with note as outlined by Thereasa Parkin, PA-C.  CHF symptomatically improved.  He has some mild DOE.  His lungs are clear with minimal LE edema.  Will plan IV Lasix today and hopefully change to PO in am.  Replete potassium.  Continue BB/ACE I.  Will add Aldactone 25mg  daily.  Awaiting therapeutic INR on coumadin before stopping Heparin.  Given his afib/CHF and history of OSA not on CPAP will set up for outpt split night study at discharge.

## 2014-05-15 DIAGNOSIS — I5022 Chronic systolic (congestive) heart failure: Secondary | ICD-10-CM | POA: Diagnosis present

## 2014-05-15 LAB — COMPREHENSIVE METABOLIC PANEL
ALBUMIN: 3.3 g/dL — AB (ref 3.5–5.2)
ALT: 34 U/L (ref 0–53)
ANION GAP: 14 (ref 5–15)
AST: 23 U/L (ref 0–37)
Alkaline Phosphatase: 29 U/L — ABNORMAL LOW (ref 39–117)
BUN: 17 mg/dL (ref 6–23)
CALCIUM: 9.1 mg/dL (ref 8.4–10.5)
CO2: 30 mEq/L (ref 19–32)
CREATININE: 1.17 mg/dL (ref 0.50–1.35)
Chloride: 95 mEq/L — ABNORMAL LOW (ref 96–112)
GFR calc Af Amer: 75 mL/min — ABNORMAL LOW (ref >90)
GFR, EST NON AFRICAN AMERICAN: 65 mL/min — AB (ref >90)
Glucose, Bld: 131 mg/dL — ABNORMAL HIGH (ref 70–99)
Potassium: 3.6 mEq/L — ABNORMAL LOW (ref 3.7–5.3)
Sodium: 139 mEq/L (ref 137–147)
Total Bilirubin: 0.8 mg/dL (ref 0.3–1.2)
Total Protein: 7.3 g/dL (ref 6.0–8.3)

## 2014-05-15 LAB — GLUCOSE, CAPILLARY
GLUCOSE-CAPILLARY: 170 mg/dL — AB (ref 70–99)
GLUCOSE-CAPILLARY: 149 mg/dL — AB (ref 70–99)
Glucose-Capillary: 142 mg/dL — ABNORMAL HIGH (ref 70–99)
Glucose-Capillary: 113 mg/dL — ABNORMAL HIGH (ref 70–99)

## 2014-05-15 LAB — CBC
HCT: 40.5 % (ref 39.0–52.0)
Hemoglobin: 12.8 g/dL — ABNORMAL LOW (ref 13.0–17.0)
MCH: 28.3 pg (ref 26.0–34.0)
MCHC: 31.6 g/dL (ref 30.0–36.0)
MCV: 89.4 fL (ref 78.0–100.0)
PLATELETS: 147 10*3/uL — AB (ref 150–400)
RBC: 4.53 MIL/uL (ref 4.22–5.81)
RDW: 14.2 % (ref 11.5–15.5)
WBC: 7.9 10*3/uL (ref 4.0–10.5)

## 2014-05-15 LAB — HEPARIN LEVEL (UNFRACTIONATED): HEPARIN UNFRACTIONATED: 0.32 [IU]/mL (ref 0.30–0.70)

## 2014-05-15 LAB — PROTIME-INR
INR: 2.21 — ABNORMAL HIGH (ref 0.00–1.49)
PROTHROMBIN TIME: 24.5 s — AB (ref 11.6–15.2)

## 2014-05-15 MED ORDER — DIGOXIN 125 MCG PO TABS: 0.1250 mg | ORAL_TABLET | Freq: Every day | ORAL | Status: DC

## 2014-05-15 MED ORDER — METOPROLOL TARTRATE 100 MG PO TABS
100.0000 mg | ORAL_TABLET | Freq: Two times a day (BID) | ORAL | Status: DC
Start: 2014-05-15 — End: 2014-05-21

## 2014-05-15 MED ORDER — METFORMIN HCL 500 MG PO TABS: 500.0000 mg | ORAL_TABLET | Freq: Every day | ORAL | Status: DC

## 2014-05-15 MED ORDER — WARFARIN SODIUM 5 MG PO TABS
5.0000 mg | ORAL_TABLET | Freq: Once | ORAL | Status: AC
  Administered 2014-05-15: 5 mg via ORAL
  Filled 2014-05-15: qty 1

## 2014-05-15 MED ORDER — LISINOPRIL 2.5 MG PO TABS: 2.5000 mg | ORAL_TABLET | Freq: Every day | ORAL | Status: DC

## 2014-05-15 MED ORDER — WARFARIN SODIUM 5 MG PO TABS: ORAL_TABLET | ORAL | Status: DC

## 2014-05-15 MED ORDER — FUROSEMIDE 40 MG PO TABS
40.0000 mg | ORAL_TABLET | Freq: Two times a day (BID) | ORAL | Status: DC
  Administered 2014-05-15 – 2014-05-16 (×2): 40 mg via ORAL
  Filled 2014-05-15 (×4): qty 1

## 2014-05-15 MED ORDER — FUROSEMIDE 40 MG PO TABS: 40.0000 mg | ORAL_TABLET | Freq: Two times a day (BID) | ORAL | Status: DC

## 2014-05-15 NOTE — Progress Notes (Addendum)
I came back by to reinforce education for [name].  He seems to have a good understanding of the basic recommendations concerning his new HF diagnosis.  I provided him with a scale for home daily weights, pill box and bag to carry medications to appts.  He has a follow-up appt scheduled with the HF clinic to see outpatient SW as well as provider on July 13 at 1:15pm.  I have spoken with CM regarding his medications being provided for 30 days through [employer].

## 2014-05-15 NOTE — Progress Notes (Signed)
Patient Name: [name] Date of Encounter: 05/15/2014     Principal Problem:   Acute systolic heart failure Active Problems:   Dyspnea   Atrial fibrillation with RVR   AKI (acute kidney injury)   Swelling of lower extremity   Depression   Type 2 diabetes, uncontrolled, with renal manifestation   CKD (chronic kidney disease), stage III   Obesity (BMI 30-39.9)   Ascites   Obstructive sleep apnea   Acute venous stasis dermatitis of both legs    SUBJECTIVE  Doing well. [facility] and has appt. Breathing much better. Thinks belly is still distended.   CURRENT MEDS . digoxin  0.125 mg Oral Daily  . furosemide  60 mg Intravenous Q12H  . insulin aspart  0-5 Units Subcutaneous QHS  . insulin aspart  0-9 Units Subcutaneous TID WC  . lisinopril  2.5 mg Oral Daily  . metoprolol tartrate  100 mg Oral BID  . sodium chloride  3 mL Intravenous Q12H  . spironolactone  25 mg Oral Daily  . triamcinolone   Topical BID  . Warfarin - Pharmacist Dosing Inpatient   Does not apply q1800  . zolpidem  5 mg Oral QHS    OBJECTIVE  Filed Vitals:   05/14/14 0617 05/14/14 1223 05/14/14 2041 05/15/14 0740  BP: 149/80 160/88 132/65 158/84  Pulse: 97  84 92  Temp: 97.2 F (36.2 C)  98.3 F (36.8 C) 98.1 F (36.7 C)  TempSrc: Oral  Oral Oral  Resp: 18  18 17   Height:      Weight: 239 lb 14.4 oz (108.818 kg)   234 lb 3.2 oz (106.232 kg)  SpO2: 99%  97% 94%    Intake/Output Summary (Last 24 hours) at 05/15/14 0836 Last data filed at 05/15/14 0700  Gross per 24 hour  Intake    540 ml  Output   4300 ml  Net  -3760 ml   Filed Weights   05/13/14 0459 05/14/14 0617 05/15/14 0740  Weight: 249 lb 9.6 oz (113.218 kg) 239 lb 14.4 oz (108.818 kg) 234 lb 3.2 oz (106.232 kg)    PHYSICAL EXAM  General: alert and oriented x3, no acute distress Cardiovascular: irregular rhythm, still with borderline tachycardia Respiratory: Clear to auscultation  bilaterally Abdomen: soft, obese, nontender, positive bowel sounds Musculoskeletal: No clubbing or cyanosis cyanosis, 1+ pitting edema bilaterally. Papular rash from the knees down bilaterally   Accessory Clinical Findings  CBC  Recent Labs  05/14/14 0228 05/15/14 0750  WBC 8.1 7.9  HGB 12.8* 12.8*  HCT 39.9 40.5  MCV 88.5 89.4  PLT 156 147*   Basic Metabolic Panel  Recent Labs  05/13/14 0320 05/14/14 0228  NA 141 142  K 3.2* 3.2*  CL 99 97  CO2 28 30  GLUCOSE 136* 106*  BUN 25* 18  CREATININE 1.21 1.13  CALCIUM 8.8 8.7   Liver Function Tests  Recent Labs  05/13/14 0320 05/14/14 0228  AST 36 28  ALT 37 37  ALKPHOS 29* 29*  BILITOT 0.3 0.5  PROT 6.4 6.6  ALBUMIN 3.0* 3.3*    TELE  Not on tele  Radiology/Studies  Ct Abdomen Wo Contrast  May 25, 2014   CLINICAL DATA:  Left-sided abdominal pain.  EXAM: CT ABDOMEN WITHOUT CONTRAST  TECHNIQUE: Multidetector CT imaging of the abdomen was performed following the standard protocol without IV contrast.  COMPARISON:  None.  FINDINGS: Images through the lung bases show tiny right pleural  effusion. Mild ascites visualized in the abdomen and diffuse body wall edema is also seen within the subcutaneous tissues.  Noncontrast images of the liver, pancreas, spleen, adrenal glands, and kidneys are unremarkable. No evidence hydronephrosis. No abdominal soft tissue masses or lymphadenopathy identified. No evidence of focal inflammatory process. The visualized intra-abdominal bowel loops are nondilated.  IMPRESSION: Mild abdominal ascites and diffuse abdominal wall edema. Tiny right pleural effusion also noted.  No focal soft tissue mass or inflammatory process identified.   Electronically Signed   By: Myles RosenthalJohn  Stahl M.D.   On: 05/09/2014 11:33   Dg Chest 2 View  05/08/2014   CLINICAL DATA:  Leg swelling and numbness.  EXAM: CHEST  2 VIEW  COMPARISON:  None.  FINDINGS: Mild to moderate cardiomegaly. No concerning upper mediastinal contour  change. Mild hypoaeration with interstitial crowding at the bases. No consolidation, edema, effusion, or pneumothorax.  IMPRESSION: Cardiomegaly without failure.   Electronically Signed   By: Tiburcio PeaJonathan  Watts M.D.   On: 05/08/2014 23:19   [place] Renal  05/09/2014   CLINICAL DATA:  Renal insufficiency  EXAM: RETROPERITONEAL ULTRASOUND  COMPARISON:  None.  FINDINGS: Right Kidney:  Length: 11.4 cm. Echogenicity and renal cortical thickness within normal limits. No mass or hydronephrosis visualized. There is no perinephric fluid. There is no sonographically demonstrable calculus or ureterectasis.  Left Kidney:  Length: 12.3 cm. Echogenicity and renal cortical thickness within normal limits. No mass or hydronephrosis visualized. There is no perinephric fluid. There is no sonographically demonstrable calculus or ureterectasis.  Bladder:  Appears normal for degree of bladder distention.  There is moderate generalized ascites.  IMPRESSION: Moderate generalized ascites.  Study otherwise unremarkable.     ASSESSMENT AND PLAN 63 yo man with no known PMH who [facility] with dyspnea and leg swelling and found to have elevated BNP and atrial fibrillation with RVR and renal failure.   Atrial fib with a controlled VR -- Continued on Metoprolol 100mg  BID and digoxin 0.125. -- New initiation of coumadin/heparin - INR 2.21 this AM per pharmacy. Heparin to be D/Cd -- Not on telemetry. HRs had been well controlled.   Acute systolic heart failure, probably tachy mediated. EF 25-30% -- On Lasix 60mg  IV BID. Neg 20.5L, down 21 lbs. Creat stable @1 .17. Can likely be converted to oral Lasix. Likely close to his dry weight.  -- Continue on BB and ACE. Aldactone 25mg  daily added yesterday. However, I spoke with Dr. Rito EhrlichKrishnan who asked that this be rethought. He has had long conversations with the patient who has very strict financial limitations. He has been added on 6 new medications this admission and can hardly  afford these. In an effort to maximize compliance it was thought that this be best to leave off or add later on.   Ascites: Initially seen on abdominal ultrasound and confirmed with CT. Unclear etiology but likely in the setting of heart failure, this may be cause. Liver looked okay by a noncontrast CT. Transaminases appear relatively stable. Would not workup further at this time, unless it worsens  Sleep apnea - he is no longer wearing a CPAP mask and is interested in pursuing this. Outpatient sleep study recommended   Hypokalemia- K 3.6.  Continue to monitor  Type 2 diabetes- uncontrolled, with renal manifestation: Diagnosis. A1c of 9.0. we'll start sliding scale and likely outpatient metformin per  IM  Dispo- once INR therapeutic and at dry weight he will be able to go home with close follow  up  Urban Gibson PA-C  Pager [phone]

## 2014-05-15 NOTE — Plan of Care (Signed)
Problem: Phase III Progression Outcomes Goal: Dyspnea controlled with activity Outcome: Progressing No shortness of breath noted upon exertion.

## 2014-05-15 NOTE — Progress Notes (Signed)
RD consulted to provide diet education regarding low sodium diet along with low carbohydrate foods and menus. RD met with patient 7/3 and provided diet education and a variety of handouts. RD answered patients follow-up questions today. Patient very knowledgeable and seems motivated to follow nutrition plan. RD contact information provided. Encouraged pt to call with any additional questions or concerns.  Pryor Ochoa RD, LDN Inpatient Clinical Dietitian Pager: [phone] After Hours Pager: [phone]

## 2014-05-15 NOTE — Progress Notes (Signed)
ANTICOAGULATION CONSULT NOTE - Follow Up Consult  Pharmacy Consult for Heparin/Coumadin Indication: atrial fibrillation  Labs:  Recent Labs  05/13/14 0320 05/14/14 0228 05/14/14 1055 05/15/14 0750  HGB 12.4* 12.8*  --  12.8*  HCT 38.4* 39.9  --  40.5  PLT 148* 156  --  147*  LABPROT 17.9* 21.6*  --  24.5*  INR 1.48 1.88*  --  2.21*  HEPARINUNFRC 0.37 0.26* 0.34 0.32  CREATININE 1.21 1.13  --  1.17    Assessment:  63 yo male on heparin to Coumadin bridge for new onset Afib. Heparin level is therapeutic at 0.32 on 1500 units/hr. INR is therapeutic at 2.21. No bleeding noted, CBC is stable.   Plan:  Discontinue IV heparin for INR >2 Coumadin 5 mg PO today Daily PT/INR Consider Coumadin 5 mg alternating with 7.5 mg daily upon discharge  [facility], [place].D., BCPS Clinical Pharmacist Pager: [phone] 05/15/2014 10:12 AM

## 2014-05-15 NOTE — Discharge Summary (Addendum)
Physician Discharge Summary  JEFFORY SNELGROVE ZOX:[id number] DOB: 1951/05/04 DOA: 05/08/2014  PCP: No PCP Per Patient  Admit date: 05/08/2014 Anticipated Discharge date: 05/16/2014  Time spent: 35 minutes  Recommendations for Outpatient Follow-up:  1. New medications: Metoprolol 100 mg by mouth twice a day 2. New medications: Coumadin 5 mg on Monday, Wednesday, Friday alternating with 7.5 mg on Sunday, Tuesday, Thursday, Saturday 3. New medication: Lisinopril 2.5 mg by mouth daily 4. New medication: Digoxin 0.125 mg by mouth daily 5. New medication: Metformin 500 by mouth daily 6. New medication: Lasix 40 mg by mouth twice a day 7. Patient will followup with Dr. Elsie Ra, Cardiology on 7/13 at 1:45 PM 8. Patient getting referral for [facility] to establish a primary care physician 9. Patient will need eventual referral for outpatient sleep study   Discharge Diagnoses:  Principal Problem:   Acute systolic heart failure Active Problems:   Dyspnea   Atrial fibrillation with RVR   AKI (acute kidney injury)   Swelling of lower extremity   Depression   Type 2 diabetes, uncontrolled, with renal manifestation   CKD (chronic kidney disease), stage III   Obesity (BMI 30-39.9)   Ascites   Obstructive sleep apnea   Acute venous stasis dermatitis of both legs   Discharge Condition: Improved, being discharged home  Diet recommendation: Heart healthy carb modified  Filed Weights   05/13/14 0459 05/14/14 0617 05/15/14 0740  Weight: 113.218 kg (249 lb 9.6 oz) 108.818 kg (239 lb 14.4 oz) 106.232 kg (234 lb 3.2 oz)    History of present illness:  63 year old white male who has really been to a doctor in many years presented to the emergency room on 6/30 with progressively worsening lower extremity swelling and shortness of breath over the past 2 weeks. In the emergency room he was noted to be in rapid atrial fibrillation, had a BNP of 3700 and a creatinine of 1.95.  Chest x-ray was consistent with congestive heart failure. Patient started on IV Cardizem and admitted to the step down unit under the hospitalist service.  Hospital Course:  Principal Problem:   Acute systolic heart failure: Patient was started on IV Lasix. An echocardiogram done 7/1 noted a markedly decreased ejection fraction of 25-30%. Cardiology was consulted. With continuing aggressive IV Lasix, patient diuresed over 21 liters and came down approximately 21 pounds to a close to dry weight of 234 pounds. Once he was able to be weaned off Cardizem drip, started on oral beta blocker plus low-dose ACE inhibitor. Cardiology will see in one week. Plans will be for a repeat echocardiogram in the next month as outpatient to followup on degree of heart failure  Active Problems:   Dyspnea: Secondary to CHF. Resolved.    Atrial fibrillation with RVR: .: Patient initially required IV Cardizem. He was able to be weaned off, heart rate still remained difficult to control to get below 100. Eventually titrated over to metoprolol 100 twice a day and digoxin was added as well. Also started on Coumadin. INR after 5 days, finally therapeutic. Recommendation is for 5 mg on Monday Wednesday and Friday and 7.5 mg the other days of the week. Patient's INR be followed by cardiology. He's had in the past some issues with compliance so this will be monitored closely.     Swelling of lower extremity: Secondary to CHF.    Depression: Previous medical history. Not depressed during his hospitalization.     Type 2 diabetes, uncontrolled, with renal manifestation  Acute renal failure in the setting of CKD (chronic kidney disease), stage 2: With Lasix, patient's creatinine continued to trend down and by day of discharge was down to 1.17. This is felt to be patient's baseline.    Obesity (BMI 30-39.9): Patient needs criteria with BMI greater than 30.    Ascites: Initially seen on abdominal ultrasound and confirmed with CT.  Unclear etiology, but given the setting of heart failure, that may be the cause. Liver looks okay but noncontrast CT. Transaminases stable. No further workup at this time was in the liver issue develops.    Obstructive sleep apnea: Patient states that he underwent a sleep study many years ago by Dr. Fannie Knee. It was recommended that he be put on CPAP. Could not find any records in Epic. Called Dr. Roxy Cedar office at [facility] and they could not find any records as well. It's possible this was done further back prior to Dr. Maple Hudson joining [facility] greater than 10 use ago. Once patient establishes with [facility], needs referral to pulmonary for repeat sleep study.    Acute venous stasis dermatitis of both legs: After admission, pt was noted to have a papular rash on his lower extremities, felt to be secondary to his edema. Treated with triamcinolone cream which improved this.  Procedures: Echocardiogram: Done 7/1. Difficult study given atrial fibrillation. Markedly decreased ejection fraction of 25-30%  Consultations:  Cardiology  Discharge Exam: Filed Vitals:   05/15/14 0740  BP: 158/84  Pulse: 92  Temp: 98.1 F (36.7 C)  Resp: 17    General: Alert and oriented x3, no acute distress Cardiovascular: Irregular rhythm, rate controlled, 2/6 systolic ejection murmur Respiratory: Clear auscultation bilaterally  Discharge Instructions You were cared for by a hospitalist during your hospital stay. If you have any questions about your discharge medications or the care you received while you were in the hospital after you are discharged, you can call the unit and asked to speak with the hospitalist on call if the hospitalist that took care of you is not available. Once you are discharged, your primary care physician will handle any further medical issues. Please note that NO REFILLS for any discharge medications will be authorized once you are discharged,  as it is imperative that you return to your primary care physician (or establish a relationship with a primary care physician if you do not have one) for your aftercare needs so that they can reassess your need for medications and monitor your lab values.     Medication List         digoxin 0.125 MG tablet  Commonly known as:  LANOXIN  Take 1 tablet (0.125 mg total) by mouth daily.     furosemide 40 MG tablet  Commonly known as:  LASIX  Take 1 tablet (40 mg total) by mouth 2 (two) times daily.     lisinopril 2.5 MG tablet  Commonly known as:  PRINIVIL,ZESTRIL  Take 1 tablet (2.5 mg total) by mouth daily.     metFORMIN 500 MG tablet  Commonly known as:  GLUCOPHAGE  Take 1 tablet (500 mg total) by mouth daily with breakfast.     metoprolol 100 MG tablet  Commonly known as:  LOPRESSOR  Take 1 tablet (100 mg total) by mouth 2 (two) times daily.     warfarin 5 MG tablet  Commonly known as:  COUMADIN  - 5 mg (1 tab) on Sundays, Tues, Thursdays & Saturdays  - 7.5 mg (  1 & half tabs) on Mon, Wed, Fridays       No Known Allergies     Follow-up Information   Follow up with Aundria Rud, NP On 05/21/2014. (at 1:45pm in the  [facility])    Specialty:  Nurse Practitioner   Contact information:   [address] [place] [address] [phone]        The results of significant diagnostics from this hospitalization (including imaging, microbiology, ancillary and laboratory) are listed below for reference.    Significant Diagnostic Studies: Ct Abdomen Wo Contrast  2014-05-29   .  IMPRESSION: Mild abdominal ascites and diffuse abdominal wall edema. Tiny right pleural effusion also noted.  No focal soft tissue mass or inflammatory process identified.   Electronically Signed   By: Myles Rosenthal M.D.   On: 05-29-14 11:33   Dg Chest 2 View  05/08/2014    IMPRESSION: Cardiomegaly without failure.   Electronically Signed   By: Tiburcio Pea M.D.   On:  05/08/2014 23:19   US Renal  29-May-2014     IMPRESSION: Moderate generalized ascites.  Study otherwise unremarkable.   Electronically Signed   By: Bretta Bang M.D.   On: 29-May-2014 08:57    Microbiology: Recent Results (from the past 240 hour(s))  MRSA PCR SCREENING     Status: None   Collection Time    05/29/2014  2:05 AM      Result Value Ref Range Status   MRSA by PCR NEGATIVE  NEGATIVE Final   Comment:            The GeneXpert MRSA Assay (FDA     approved for NASAL specimens     only), is one component of a     comprehensive MRSA colonization     surveillance program. It is not     intended to diagnose MRSA     infection nor to guide or     monitor treatment for     MRSA infections.     Labs: Basic Metabolic Panel:  Recent Labs Lab 05/11/14 0426 05/12/14 0417 05/13/14 0320 05/14/14 0228 05/15/14 0750  NA 141 139 141 142 139  K 4.4 4.6 3.2* 3.2* 3.6*  CL 101 99 99 97 95*  CO2 30 27 28 30 30   GLUCOSE 122* 124* 136* 106* 131*  BUN 27* 28* 25* 18 17  CREATININE 1.48* 1.43* 1.21 1.13 1.17  CALCIUM 8.4 9.1 8.8 8.7 9.1   Liver Function Tests:  Recent Labs Lab 05/11/14 0426 05/12/14 0417 05/13/14 0320 05/14/14 0228 05/15/14 0750  AST 38* 43* 36 28 23  ALT 37 38 37 37 34  ALKPHOS 26* 29* 29* 29* 29*  BILITOT 0.3 0.4 0.3 0.5 0.8  PROT 6.0 6.5 6.4 6.6 7.3  ALBUMIN 3.0* 3.2* 3.0* 3.3* 3.3*   No results found for this basename: LIPASE, AMYLASE,  in the last 168 hours No results found for this basename: AMMONIA,  in the last 168 hours CBC:  Recent Labs Lab 2014/05/29 0311 05/10/14 0305 05/11/14 0426 05/13/14 0320 05/14/14 0228 05/15/14 0750  WBC 9.3 8.8 8.1 7.3 8.1 7.9  NEUTROABS 6.5 6.4 5.4  --   --   --   HGB 12.9* 12.2* 12.2* 12.4* 12.8* 12.8*  HCT 39.4 37.2* 38.3* 38.4* 39.9 40.5  MCV 87.9 88.4 89.1 88.5 88.5 89.4  PLT 153 151 144* 148* 156 147*   Cardiac Enzymes:  Recent Labs Lab 05/08/14 2240 May 29, 2014 0125 May 29, 2014 1350  TROPONINI  <0.30 <0.30 <0.30   BNP: BNP (last 3 results)  Recent Labs  05/08/14 2042  PROBNP 3681.0*   CBG:  Recent Labs Lab 05/14/14 1150 05/14/14 1701 05/14/14 2108 05/15/14 0727 05/15/14 1203  GLUCAP 175* 122* 169* 142* 170*       Signed:  Isac Lincks K  [facility] 05/15/2014, 1:42 PM

## 2014-05-15 NOTE — Progress Notes (Signed)
Patient seen and examined and agree with note as outlined by Thereasa Parkin PA-C.  I think he is nearing his dry weight.  He has lost over 21 lbs and is net neg 21L of fluid.  His renal function is stable.  Will change to PO Lasix today.  Appreciate Heart Failure Nurse Navigator teaching.  Agree with stopping Aldactone given patient's financial limitations.

## 2014-05-15 NOTE — Progress Notes (Signed)
In to see pt after report given by off going nurse. A&0 x$.  OOB to chir call be at reach and instructed to call for assist as needed.  Verbalized understanding.  Will continue to monitor.  Estella Malatesta,RN.

## 2014-05-16 DIAGNOSIS — N179 Acute kidney failure, unspecified: Secondary | ICD-10-CM

## 2014-05-16 LAB — COMPREHENSIVE METABOLIC PANEL
ALK PHOS: 29 U/L — AB (ref 39–117)
ALT: 35 U/L (ref 0–53)
AST: 23 U/L (ref 0–37)
Albumin: 3.2 g/dL — ABNORMAL LOW (ref 3.5–5.2)
Anion gap: 10 (ref 5–15)
BUN: 21 mg/dL (ref 6–23)
CO2: 34 mEq/L — ABNORMAL HIGH (ref 19–32)
Calcium: 9.2 mg/dL (ref 8.4–10.5)
Chloride: 95 mEq/L — ABNORMAL LOW (ref 96–112)
Creatinine, Ser: 1.3 mg/dL (ref 0.50–1.35)
GFR calc non Af Amer: 57 mL/min — ABNORMAL LOW (ref >90)
GFR, EST AFRICAN AMERICAN: 66 mL/min — AB (ref >90)
GLUCOSE: 123 mg/dL — AB (ref 70–99)
POTASSIUM: 3.6 meq/L — AB (ref 3.7–5.3)
SODIUM: 139 meq/L (ref 137–147)
TOTAL PROTEIN: 6.8 g/dL (ref 6.0–8.3)
Total Bilirubin: 0.5 mg/dL (ref 0.3–1.2)

## 2014-05-16 LAB — CBC
HCT: 37.8 % — ABNORMAL LOW (ref 39.0–52.0)
HEMOGLOBIN: 12 g/dL — AB (ref 13.0–17.0)
MCH: 28.4 pg (ref 26.0–34.0)
MCHC: 31.7 g/dL (ref 30.0–36.0)
MCV: 89.4 fL (ref 78.0–100.0)
Platelets: 147 10*3/uL — ABNORMAL LOW (ref 150–400)
RBC: 4.23 MIL/uL (ref 4.22–5.81)
RDW: 14.3 % (ref 11.5–15.5)
WBC: 6.4 10*3/uL (ref 4.0–10.5)

## 2014-05-16 LAB — HEPARIN LEVEL (UNFRACTIONATED): Heparin Unfractionated: <0.1 IU/mL — ABNORMAL LOW (ref 0.30–0.70)

## 2014-05-16 LAB — GLUCOSE, CAPILLARY
Glucose-Capillary: 145 mg/dL — ABNORMAL HIGH (ref 70–99)
Glucose-Capillary: 152 mg/dL — ABNORMAL HIGH (ref 70–99)

## 2014-05-16 LAB — PROTIME-INR
INR: 2.18 — AB (ref 0.00–1.49)
Prothrombin Time: 24.3 seconds — ABNORMAL HIGH (ref 11.6–15.2)

## 2014-05-16 NOTE — Progress Notes (Signed)
UR completed Caroll Weinheimer K. Onya Eutsler, RN, BSN, MSHL, CCM  05/16/2014 2:48 PM

## 2014-05-16 NOTE — Progress Notes (Signed)
Subjective: No complaints  Objective: Vital signs in last 24 hours: Temp:  [97.4 F (36.3 C)-98.7 F (37.1 C)] 98.2 F (36.8 C) (07/08 0614) Pulse Rate:  [85-90] 90 (07/08 1013) Resp:  [18-20] 19 (07/08 0614) BP: (129-159)/(87-96) 155/95 mmHg (07/08 1013) SpO2:  [93 %-99 %] 93 % (07/08 0614) Weight:  [105.6 kg (232 lb 12.9 oz)] 105.6 kg (232 lb 12.9 oz) (07/08 8295) Weight change:  Last BM Date: 05/15/14  Intake/Output from previous day: 07/07 0701 - 07/08 0700 In: 645 [P.O.:540; I.V.:105] Out: 2575 [Urine:2575] Intake/Output this shift: Total I/O In: 240 [P.O.:240] Out: -   Gen: talkative. Walking around room. Oriented. Lungs: CTA without WRR CV: RRR without MGR Abd: S, NT, ND Ext trace edema  Lab Results:  Recent Labs  05/15/14 0750 05/16/14 0525  WBC 7.9 6.4  HGB 12.8* 12.0*  HCT 40.5 37.8*  PLT 147* 147*   BMET  Recent Labs  05/15/14 0750 05/16/14 0525  NA 139 139  K 3.6* 3.6*  CL 95* 95*  CO2 30 34*  GLUCOSE 131* 123*  BUN 17 21  CREATININE 1.17 1.30  CALCIUM 9.1 9.2    Studies/Results: No results found.   Assessment/Plan: Active Problems:   Dyspnea   Atrial fibrillation with RVR   AKI (acute kidney injury)   Swelling of lower extremity   Depression   Type 2 diabetes, uncontrolled, with renal manifestation   CKD (chronic kidney disease), stage II   Obesity (BMI 30-39.9)   Ascites   Obstructive sleep apnea   Acute venous stasis dermatitis of both legs   Chronic systolic heart failure  Discharge home. See discharge summary   LOS: 8 days   Graeson Nouri L 05/16/2014, 11:34 AM

## 2014-05-16 NOTE — Progress Notes (Signed)
Discussed discharge instruction with pt including how and when to take medications, daily weights, activity, diet, follow up appts, warfarin education and prescriptions. Pt verbalized understanding and denied any questions. Prescriptions given and pt acknowledged receipt.   Juliane Lack, RN

## 2014-05-16 NOTE — Progress Notes (Signed)
CSW notified Rachael Darby- Finanical Counselor of patient's admission and planned d/c today.  He would benefit from financial counseling services. Lorri Frederick. [place]  [phone]

## 2014-05-20 NOTE — Progress Notes (Addendum)
Patient ID: [name], male   DOB: [birth year], 63 y.o.   MRN: [id number] PCP: [facility] Primary Cardiologist: Dr. Tresa Endo  HPI: [name] is a 63 yo male with a history of afib, depression, DM2, CKD stage III, obesity, OSA and chronic systolic HF.  He was admitted 6/30-05/16/14 with dyspena and LE edema. Found to be in afib with RVR. Diuresed with IV lasix and d/c weight 234 lbs.   Post Hospital Follow up for Heart Failure: Doing well since being home other than very bored and has nothing to do at home. Denies SOB, orthopnea, PND or CP. Walking 30 minutes a day with no issues. Able to go up hills. Weight at home 228-225 lbs. Following low salt diet and drinking less than 2L a day. Complaints of itching all over his whole body.   ROS: All systems negative except as listed in HPI, PMH and Problem List.  SH:  History   Social History  . Marital Status: Divorced    Spouse Name: N/A    Number of Children: N/A  . Years of Education: N/A   Occupational History  . Not on file.   Social History Main Topics  . Smoking status: Former Games developer  . Smokeless tobacco: Not on file     Comment: Smoked when he was in his teens  . Alcohol Use: No  . Drug Use: No  . Sexual Activity: Not on file   Other Topics Concern  . Not on file   Social History Narrative   Lives in [place] by himself. Retired from [employer] for [employer].     FH:  Family History  Problem Relation Age of Onset  . Heart attack Mother     deceased  . Diabetes Mother     Past Medical History  Diagnosis Date  . A-fib   . Depression   . Type 2 diabetes mellitus   . CKD (chronic kidney disease) stage 3, GFR 30-59 ml/min   . OSA (obstructive sleep apnea)   . Chronic systolic heart failure   . HTN (hypertension)     Current Outpatient Prescriptions  Medication Sig Dispense Refill  . calcium-vitamin D (OSCAL WITH D) 250-125 MG-UNIT per tablet Take 1 tablet by mouth  daily.      . digoxin (LANOXIN) 0.125 MG tablet Take 1 tablet (0.125 mg total) by mouth daily.  90 tablet  1  . furosemide (LASIX) 40 MG tablet Take 1 tablet (40 mg total) by mouth 2 (two) times daily.  180 tablet  1  . lisinopril (PRINIVIL,ZESTRIL) 2.5 MG tablet Take 1 tablet (2.5 mg total) by mouth daily.  90 tablet  1  . metFORMIN (GLUCOPHAGE) 500 MG tablet Take 1 tablet (500 mg total) by mouth daily with breakfast.  60 tablet  0  . metoprolol (LOPRESSOR) 100 MG tablet Take 1 tablet (100 mg total) by mouth 2 (two) times daily.  180 tablet  1  . multivitamin-iron-minerals-folic acid (CENTRUM) chewable tablet Chew 1 tablet by mouth daily.      . Omega-3 Fatty Acids (FISH OIL) 300 MG CAPS Take by mouth.      . warfarin (COUMADIN) 5 MG tablet 5 mg (1 tab) on Sundays, Tues, Thursdays & Saturdays 7.5 mg (1 & half tabs) on Mon, Wed, Fridays  120 tablet  1   No current facility-administered medications for this encounter.    Filed Vitals:   05/21/14 1303  BP: 159/86  Pulse: 82  Resp: 18  Weight: 229 lb (103.874 kg)  SpO2: 96%    PHYSICAL EXAM: General:  Well appearing. No resp difficulty HEENT: normal Neck: supple. JVP difficult to assess d/t body habitus but appears flat. Carotids 2+ bilaterally; no bruits. No lymphadenopathy or thryomegaly appreciated. Cor: PMI normal. Regular rate & irregular rhythm. No rubs, gallops or murmurs. Lungs: clear Abdomen: soft, obese, nontender, nondistended. No hepatosplenomegaly. No bruits or masses. Good bowel sounds. Extremities: no cyanosis, clubbing, rash, bilateral maculopapular rash, no edema Neuro: alert & orientedx3, cranial nerves grossly intact. Moves all 4 extremities w/o difficulty. Affect pleasant.   ASSESSMENT & PLAN:  1) Chronic systolic HF: EF 25-30%, diff HK, RV mildly dilated and sys. fx mildly/mod reduced (05/2014) - Reviewed discharge summary and patient recently in the hospital with acute HF new onset and afib with RVR which was  also new. He had not seen a healthcare provider in more than 10 years. Discharge weight 234 lbs. Cardiolyoptahy likely tachy induced r/t afib with RVR.  - NYHA II symptoms and volume status stable. Will continue lasix 40 mg BID. Discussed the use of sliding scale diuretics. Check BMET today. - Will stop lopressor and start coreg 12.5 mg BID. Told to call if any dizziness or fatigue. - Increase lisinopril to 2.5 mg BID. Check BMET 2 weeks. - Continue digoxin 0.125 mg daily, will check dig level today. - Not on Cleda DaubSpiro can consider adding next visit. - He has not had an ischemic work-up. Will need to do LHC eventually. Will wait until after 1 month post TEE-DC-CV.  - Will need repeat ECHO or cMRI in 08/2014 to reassess EF. If remains less than 35% will refer to EP for ICD. QRS narrow so no CRT-D. - Reinforced the need and importance of daily weights, a low sodium diet, and fluid restriction (less than 2 L a day). Instructed to call the HF clinic if weight increases more than 3 lbs overnight or 5 lbs in a week.  2) HTN - Elevated. As above change to coreg and increase lisinopril. Continue to follow 3) Afib - New to patient. He presented to hospital with afib with RVR. Controlled on BB. No s/s of bleeding, will continue coumadin. Will refer to Coumadin clinic to manage and for education. Set apt up for Thursday. Could consider switching to NOAC if he gets insurance down the road. He has never had cardioversion. Will set up for TEE-DC-CV next week. 4) ?OSA - Reports he was diagnosed with OSA in the past, however he does not have CPAP machine. Will eventually need sleep study but will hold off for now to see if he gets [employer].  Total time spent 40 minutes with greater than 50% spent on coordination of care and discussing the above.   F/U 2 weeks Darren PotashCosgrove, [name] [facility]NP-C 7:37 PM

## 2014-05-21 ENCOUNTER — Encounter (HOSPITAL_COMMUNITY): Payer: Self-pay

## 2014-05-21 ENCOUNTER — Ambulatory Visit (HOSPITAL_COMMUNITY)
Admit: 2014-05-21 | Discharge: 2014-05-21 | Disposition: A | Discharge status: Home / Self Care | Payer: Self-pay | Source: Ambulatory Visit | Attending: Internal Medicine | Admitting: Internal Medicine

## 2014-05-21 VITALS — BP 159/86 | HR 82 | Resp 18 | Wt 229.0 lb

## 2014-05-21 DIAGNOSIS — I509 Heart failure, unspecified: Secondary | ICD-10-CM | POA: Insufficient documentation

## 2014-05-21 DIAGNOSIS — N183 Chronic kidney disease, stage 3 unspecified: Secondary | ICD-10-CM | POA: Insufficient documentation

## 2014-05-21 DIAGNOSIS — Z7901 Long term (current) use of anticoagulants: Secondary | ICD-10-CM | POA: Insufficient documentation

## 2014-05-21 DIAGNOSIS — I4891 Unspecified atrial fibrillation: Secondary | ICD-10-CM

## 2014-05-21 DIAGNOSIS — E119 Type 2 diabetes mellitus without complications: Secondary | ICD-10-CM | POA: Insufficient documentation

## 2014-05-21 DIAGNOSIS — I129 Hypertensive chronic kidney disease with stage 1 through stage 4 chronic kidney disease, or unspecified chronic kidney disease: Secondary | ICD-10-CM | POA: Insufficient documentation

## 2014-05-21 DIAGNOSIS — G4733 Obstructive sleep apnea (adult) (pediatric): Secondary | ICD-10-CM

## 2014-05-21 DIAGNOSIS — I10 Essential (primary) hypertension: Secondary | ICD-10-CM | POA: Insufficient documentation

## 2014-05-21 DIAGNOSIS — E669 Obesity, unspecified: Secondary | ICD-10-CM | POA: Insufficient documentation

## 2014-05-21 DIAGNOSIS — I5022 Chronic systolic (congestive) heart failure: Secondary | ICD-10-CM

## 2014-05-21 HISTORY — DX: Chronic systolic (congestive) heart failure: I50.22

## 2014-05-21 HISTORY — DX: Obstructive sleep apnea (adult) (pediatric): G47.33

## 2014-05-21 HISTORY — DX: Type 2 diabetes mellitus without complications: E11.9

## 2014-05-21 HISTORY — DX: Essential (primary) hypertension: I10

## 2014-05-21 HISTORY — DX: Unspecified atrial fibrillation: I48.91

## 2014-05-21 HISTORY — DX: Depression, unspecified: F32.A

## 2014-05-21 HISTORY — DX: Major depressive disorder, single episode, unspecified: F32.9

## 2014-05-21 HISTORY — DX: Chronic kidney disease, stage 3 (moderate): N18.3

## 2014-05-21 HISTORY — DX: Chronic kidney disease, stage 3 unspecified: N18.30

## 2014-05-21 LAB — BASIC METABOLIC PANEL
Anion gap: 14 (ref 5–15)
BUN: 31 mg/dL — AB (ref 6–23)
CHLORIDE: 98 meq/L (ref 96–112)
CO2: 27 meq/L (ref 19–32)
Calcium: 9.6 mg/dL (ref 8.4–10.5)
Creatinine, Ser: 1.43 mg/dL — ABNORMAL HIGH (ref 0.50–1.35)
GFR calc Af Amer: 59 mL/min — ABNORMAL LOW (ref >90)
GFR calc non Af Amer: 51 mL/min — ABNORMAL LOW (ref >90)
Glucose, Bld: 128 mg/dL — ABNORMAL HIGH (ref 70–99)
POTASSIUM: 4.2 meq/L (ref 3.7–5.3)
Sodium: 139 mEq/L (ref 137–147)

## 2014-05-21 MED ORDER — CARVEDILOL 12.5 MG PO TABS: 12.5000 mg | ORAL_TABLET | Freq: Two times a day (BID) | ORAL | Status: DC

## 2014-05-21 MED ORDER — FUROSEMIDE 40 MG PO TABS: 40.0000 mg | ORAL_TABLET | Freq: Two times a day (BID) | ORAL | Status: DC

## 2014-05-21 MED ORDER — DIGOXIN 125 MCG PO TABS: 0.1250 mg | ORAL_TABLET | Freq: Every day | ORAL | Status: DC

## 2014-05-21 MED ORDER — LISINOPRIL 2.5 MG PO TABS: 2.5000 mg | ORAL_TABLET | Freq: Two times a day (BID) | ORAL | Status: DC

## 2014-05-21 NOTE — Addendum Note (Signed)
Encounter addended by: Aundria Rud, NP on: 05/21/2014  8:13 PM<BR>     Documentation filed: Notes Section

## 2014-05-21 NOTE — Patient Instructions (Signed)
Stop lopressor.  Increase your lisinopril to 2.5 mg twice a day.  Start coreg 12.5 mg twice a day.  Go to Coumadin Appointment Thursday at 10:30, [facility] [address], [address] Floor  Follow up 2 weeks  Do the following things EVERYDAY: 1) Weigh yourself in the morning before breakfast. Write it down and keep it in a log. 2) Take your medicines as prescribed 3) Eat low salt foods-Limit salt (sodium) to 2000 mg per day.  4) Stay as active as you can everyday 5) Limit all fluids for the day to less than 2 liters 6)

## 2014-05-22 ENCOUNTER — Telehealth (HOSPITAL_COMMUNITY): Payer: Self-pay

## 2014-05-22 NOTE — Telephone Encounter (Signed)
Lab results reviewed with patient, instructed to hold lasix x 2 days, then resume as prescribed.  Aware and agreeable. Ave Filter

## 2014-05-23 NOTE — Addendum Note (Signed)
Encounter addended by: Aundria Rud, NP on: 05/23/2014  1:31 PM<BR>     Documentation filed: Notes Section

## 2014-05-24 ENCOUNTER — Ambulatory Visit (INDEPENDENT_AMBULATORY_CARE_PROVIDER_SITE_OTHER): Payer: Self-pay

## 2014-05-24 DIAGNOSIS — I4891 Unspecified atrial fibrillation: Secondary | ICD-10-CM

## 2014-05-24 DIAGNOSIS — Z5181 Encounter for therapeutic drug level monitoring: Secondary | ICD-10-CM

## 2014-05-24 LAB — POCT INR: INR: 3.2

## 2014-05-24 NOTE — Patient Instructions (Signed)

## 2014-05-25 ENCOUNTER — Encounter (HOSPITAL_COMMUNITY): Payer: Self-pay | Admitting: Pharmacy Technician

## 2014-05-25 ENCOUNTER — Telehealth (HOSPITAL_COMMUNITY): Payer: Self-pay | Admitting: Cardiology

## 2014-05-25 ENCOUNTER — Encounter (HOSPITAL_COMMUNITY): Payer: Self-pay

## 2014-05-25 NOTE — Telephone Encounter (Signed)
Pt scheduled for TEE/DCCV on 06/04/14 Cpt code 93312/92960 icd 9 427.31 With pt current insurance- NO COVERAGE No pre cert is required

## 2014-05-27 ENCOUNTER — Encounter (HOSPITAL_COMMUNITY): Payer: Self-pay | Admitting: Emergency Medicine

## 2014-05-27 ENCOUNTER — Emergency Department (INDEPENDENT_AMBULATORY_CARE_PROVIDER_SITE_OTHER)
Admission: EM | Admit: 2014-05-27 | Discharge: 2014-05-27 | Disposition: A | Discharge status: Home / Self Care | Payer: Self-pay | Source: Home / Self Care | Attending: Emergency Medicine | Admitting: Emergency Medicine

## 2014-05-27 DIAGNOSIS — I872 Venous insufficiency (chronic) (peripheral): Secondary | ICD-10-CM

## 2014-05-27 DIAGNOSIS — R21 Rash and other nonspecific skin eruption: Secondary | ICD-10-CM

## 2014-05-27 MED ORDER — PERMETHRIN 5 % EX CREA: TOPICAL_CREAM | CUTANEOUS | Status: DC

## 2014-05-27 MED ORDER — HYDROXYZINE HCL 25 MG PO TABS: 25.0000 mg | ORAL_TABLET | Freq: Three times a day (TID) | ORAL | Status: DC | PRN

## 2014-05-27 NOTE — ED Provider Notes (Signed)
CSN: [id number]     Arrival date & time 05/27/14  1104 History   First MD Initiated Contact with Patient 05/27/14 1118     Chief Complaint  Patient presents with  . Rash   (Consider location/radiation/quality/duration/timing/severity/associated sxs/prior Treatment) HPI Comments: Patient presents with a rash on bilateral legs and torso that has been present for an unknown number of months. He has yet to discuss this with any of his medical providers. States rash has not changed and is described as pruritic. Reports he did discuss this rash with one of his friends and he is now concerned about either scabies or bedbugs.  Reports he has been using OTC "poison ivy anti-itch cream" with some relief.   Patient is a 63 y.o. male presenting with rash.  Rash Location:  Torso and leg   Past Medical History  Diagnosis Date  . A-fib   . Depression   . Type 2 diabetes mellitus   . CKD (chronic kidney disease) stage 3, GFR 30-59 ml/min   . OSA (obstructive sleep apnea)   . Chronic systolic heart failure     a. ECHO (05/2014): EF 25-30%, diff HK, RV midly dilated and sys fx mildly/mod reduced  . HTN (hypertension)    Past Surgical History  Procedure Laterality Date  . Vasectomy    . Nose surgery      Nasal septum surgery   Family History  Problem Relation Age of Onset  . Heart attack Mother     deceased  . Diabetes Mother    History  Substance Use Topics  . Smoking status: Former Smoker    Types: Cigars    Quit date: 11/09/2013  . Smokeless tobacco: Not on file     Comment: Smoked when he was in his teens  . Alcohol Use: No    Review of Systems  Constitutional: Negative.   HENT: Negative.   Eyes: Negative.   Respiratory: Negative.   Cardiovascular: Negative.   Gastrointestinal: Negative.   Endocrine: Negative for polydipsia, polyphagia and polyuria.  Genitourinary: Negative.   Musculoskeletal: Negative.   Skin: Positive for rash. Negative for color change, pallor and wound.   Neurological: Negative.   Hematological: Negative for adenopathy. Does not bruise/bleed easily.  Psychiatric/Behavioral: Negative.     Allergies  Review of patient's allergies indicates no known allergies.  Home Medications   Prior to Admission medications   Medication Sig Start Date End Date Taking? Authorizing Provider  calcium-vitamin D (OSCAL WITH D) 250-125 MG-UNIT per tablet Take 1 tablet by mouth daily.   Yes Historical Provider, MD  carvedilol (COREG) 12.5 MG tablet Take 1 tablet (12.5 mg total) by mouth 2 (two) times daily with a meal. 05/21/14  Yes Aundria RudAli B Cosgrove, NP  digoxin (LANOXIN) 0.125 MG tablet Take 1 tablet (0.125 mg total) by mouth daily. 05/21/14  Yes Aundria RudAli B Cosgrove, NP  furosemide (LASIX) 40 MG tablet Take 1 tablet (40 mg total) by mouth 2 (two) times daily. 05/21/14  Yes Aundria RudAli B Cosgrove, NP  lisinopril (PRINIVIL,ZESTRIL) 2.5 MG tablet Take 1 tablet (2.5 mg total) by mouth 2 (two) times daily. 05/21/14  Yes Aundria RudAli B Cosgrove, NP  metFORMIN (GLUCOPHAGE) 500 MG tablet Take 1 tablet (500 mg total) by mouth daily with breakfast. 05/15/14  Yes Hollice EspySendil K Krishnan, MD  multivitamin-iron-minerals-folic acid (CENTRUM) chewable tablet Chew 1 tablet by mouth daily.   Yes Historical Provider, MD  Omega-3 Fatty Acids (FISH OIL) 300 MG CAPS Take by mouth.   Yes Historical  Provider, MD  warfarin (COUMADIN) 5 MG tablet 5 mg (1 tab) on Sundays, Tues, Thursdays & Saturdays 7.5 mg (1 & half tabs) on Mon, Wed, Fridays 05/15/14  Yes Hollice Espy, MD  hydrOXYzine (ATARAX/VISTARIL) 25 MG tablet Take 1 tablet (25 mg total) by mouth every 8 (eight) hours as needed for itching. 05/27/14   Ardis Rowan, PA  permethrin (ELIMITE) 5 % cream Apply from chin to toes, leave 8 hours, then shower. Repeat x 1 in one week. 05/27/14   Jess Barters Jazzalyn Loewenstein, PA   BP 120/77  Pulse 81  Temp(Src) 98.8 F (37.1 C) (Oral)  Resp 12  SpO2 95% Physical Exam  Nursing note and vitals reviewed. Constitutional: He  is oriented to person, place, and time. He appears well-developed and well-nourished. No distress.  +overweight  HENT:  Head: Normocephalic and atraumatic.  Mouth/Throat: Oropharynx is clear and moist.  No mucous membrane lesions  Eyes: Conjunctivae are normal.  Cardiovascular: Normal rate.   Pulmonary/Chest: Effort normal.  Musculoskeletal: Normal range of motion.  Neurological: He is alert and oriented to person, place, and time.  Skin: Skin is warm and dry. Rash noted. No erythema.  Appears to have two separate skin issues. Has bilateral lower leg brawny discoloration of stasis dermatitis.  In addition, has multiple discrete erythematous papules, many with superficial healing excoriations, covering entire right and left legs with multiple scattered lesions of the same at lower back and lower anterior abdomen.  No lesions on palms or soles.   Psychiatric: He has a normal mood and affect. His behavior is normal.    ED Course  Procedures (including critical care time) Labs Review Labs Reviewed - No data to display  Imaging Review No results found.   MDM   1. Rash   2. Stasis dermatitis of both legs   Explained to patient that visible hyperpigmented changes of stasis dermatitis would persist and not change with any sort of treatment.  Patient reports he does live in an environment that is concerning for scabies and/or bed bugs. While his current rash spares his hands and feet, I told him we would try a course of Elimite with Atarax for itching. Advised him that the treatment for bed bugs was to have his living environment inspected and fumigated if found to have bed bugs.    Jess Barters [place], [place] 05/27/14 [phone]

## 2014-05-27 NOTE — ED Provider Notes (Signed)
Medical screening examination/treatment/procedure(s) were performed by non-physician practitioner and as supervising physician I was immediately available for consultation/collaboration.  Leslee Home, M.D.  Reuben Likes, MD 05/27/14 [phone]

## 2014-05-27 NOTE — Discharge Instructions (Signed)

## 2014-05-27 NOTE — ED Notes (Signed)
Rash  buttocks down legs since before 6/30.  Itches occasionally. Rash on chest from where they shaved him in the hospital and had monitor leads.

## 2014-05-31 ENCOUNTER — Ambulatory Visit (INDEPENDENT_AMBULATORY_CARE_PROVIDER_SITE_OTHER): Payer: Self-pay | Admitting: Pharmacist

## 2014-05-31 DIAGNOSIS — Z5181 Encounter for therapeutic drug level monitoring: Secondary | ICD-10-CM

## 2014-05-31 DIAGNOSIS — I4891 Unspecified atrial fibrillation: Secondary | ICD-10-CM

## 2014-05-31 LAB — POCT INR: INR: 3

## 2014-06-04 ENCOUNTER — Encounter (HOSPITAL_COMMUNITY): Payer: Self-pay | Admitting: Anesthesiology

## 2014-06-04 ENCOUNTER — Ambulatory Visit (HOSPITAL_COMMUNITY)
Admission: RE | Admit: 2014-06-04 | Discharge: 2014-06-04 | Disposition: A | Discharge status: Home / Self Care | Payer: Self-pay | Source: Ambulatory Visit | Attending: Cardiology | Admitting: Cardiology

## 2014-06-04 ENCOUNTER — Encounter (HOSPITAL_COMMUNITY)
Admission: RE | Disposition: A | Discharge status: Home / Self Care | Payer: Self-pay | Source: Ambulatory Visit | Attending: Cardiology

## 2014-06-04 ENCOUNTER — Encounter (HOSPITAL_COMMUNITY): Payer: Self-pay

## 2014-06-04 ENCOUNTER — Ambulatory Visit (HOSPITAL_COMMUNITY): Payer: Self-pay | Admitting: Anesthesiology

## 2014-06-04 DIAGNOSIS — Z7901 Long term (current) use of anticoagulants: Secondary | ICD-10-CM | POA: Insufficient documentation

## 2014-06-04 DIAGNOSIS — I129 Hypertensive chronic kidney disease with stage 1 through stage 4 chronic kidney disease, or unspecified chronic kidney disease: Secondary | ICD-10-CM | POA: Insufficient documentation

## 2014-06-04 DIAGNOSIS — G4733 Obstructive sleep apnea (adult) (pediatric): Secondary | ICD-10-CM | POA: Insufficient documentation

## 2014-06-04 DIAGNOSIS — I4891 Unspecified atrial fibrillation: Secondary | ICD-10-CM | POA: Insufficient documentation

## 2014-06-04 DIAGNOSIS — N183 Chronic kidney disease, stage 3 unspecified: Secondary | ICD-10-CM | POA: Insufficient documentation

## 2014-06-04 DIAGNOSIS — E119 Type 2 diabetes mellitus without complications: Secondary | ICD-10-CM | POA: Insufficient documentation

## 2014-06-04 DIAGNOSIS — E669 Obesity, unspecified: Secondary | ICD-10-CM | POA: Insufficient documentation

## 2014-06-04 DIAGNOSIS — Z87891 Personal history of nicotine dependence: Secondary | ICD-10-CM | POA: Insufficient documentation

## 2014-06-04 DIAGNOSIS — I509 Heart failure, unspecified: Secondary | ICD-10-CM | POA: Insufficient documentation

## 2014-06-04 DIAGNOSIS — I5022 Chronic systolic (congestive) heart failure: Secondary | ICD-10-CM | POA: Insufficient documentation

## 2014-06-04 HISTORY — PX: TEE WITHOUT CARDIOVERSION: SHX5443

## 2014-06-04 HISTORY — PX: CARDIOVERSION: SHX1299

## 2014-06-04 LAB — PROTIME-INR
INR: 3.16 — ABNORMAL HIGH (ref 0.00–1.49)
PROTHROMBIN TIME: 32.4 s — AB (ref 11.6–15.2)

## 2014-06-04 LAB — GLUCOSE, CAPILLARY: Glucose-Capillary: 148 mg/dL — ABNORMAL HIGH (ref 70–99)

## 2014-06-04 SURGERY — ECHOCARDIOGRAM, TRANSESOPHAGEAL
Anesthesia: General

## 2014-06-04 MED ORDER — SODIUM CHLORIDE 0.9 % IV SOLN
INTRAVENOUS | Status: DC | PRN
  Administered 2014-06-04: 08:00:00 via INTRAVENOUS

## 2014-06-04 MED ORDER — LIDOCAINE VISCOUS 2 % MT SOLN
OROMUCOSAL | Status: AC
  Filled 2014-06-04: qty 15

## 2014-06-04 MED ORDER — LIDOCAINE VISCOUS 2 % MT SOLN
OROMUCOSAL | Status: DC | PRN
  Administered 2014-06-04: 10 mL via OROMUCOSAL

## 2014-06-04 MED ORDER — MIDAZOLAM HCL 2 MG/2ML IJ SOLN
INTRAMUSCULAR | Status: AC
  Filled 2014-06-04: qty 2

## 2014-06-04 MED ORDER — SODIUM CHLORIDE 0.9 % IV SOLN: 250.0000 mL | INTRAVENOUS | Status: DC

## 2014-06-04 MED ORDER — MIDAZOLAM HCL 5 MG/5ML IJ SOLN
INTRAMUSCULAR | Status: DC | PRN
  Administered 2014-06-04 (×2): 1 mg via INTRAVENOUS

## 2014-06-04 MED ORDER — SODIUM CHLORIDE 0.9 % IJ SOLN
3.0000 mL | INTRAMUSCULAR | Status: DC | PRN
Start: 2014-06-04 — End: 2014-06-04

## 2014-06-04 MED ORDER — SODIUM CHLORIDE 0.9 % IJ SOLN: 3.0000 mL | Freq: Two times a day (BID) | INTRAMUSCULAR | Status: DC

## 2014-06-04 MED ORDER — SODIUM CHLORIDE 0.9 % IV SOLN: INTRAVENOUS | Status: DC

## 2014-06-04 MED ORDER — PROPOFOL INFUSION 10 MG/ML OPTIME
INTRAVENOUS | Status: DC | PRN
Start: 2014-06-04 — End: 2014-06-04
  Administered 2014-06-04: 100 ug/kg/min via INTRAVENOUS

## 2014-06-04 NOTE — Transfer of Care (Signed)
Immediate Anesthesia Transfer of Care Note  Patient: [name]  Procedure(s) Performed: Procedure(s): TRANSESOPHAGEAL ECHOCARDIOGRAM (TEE) (N/A) CARDIOVERSION (N/A)  Patient Location: Endoscopy Unit  Anesthesia Type:MAC and General  Level of Consciousness: awake, alert  and oriented  Airway & Oxygen Therapy: Patient connected to nasal cannula oxygen  Post-op Assessment: Report given to PACU RN  Post vital signs: stable  Complications: No apparent anesthesia complications

## 2014-06-04 NOTE — Anesthesia Preprocedure Evaluation (Addendum)
Anesthesia Evaluation  Patient identified by MRN, date of birth, ID band Patient awake    Reviewed: Allergy & Precautions, H&P , NPO status , Patient's Chart, lab work & pertinent test results, reviewed documented beta blocker date and time   Airway Mallampati: III TM Distance: >3 FB Neck ROM: full    Dental  (+) Dental Advisory Given, Teeth Intact, Poor Dentition   Pulmonary shortness of breath and with exertion, sleep apnea , former smoker,  breath sounds clear to auscultation        Cardiovascular hypertension, Pt. on medications and Pt. on home beta blockers +CHF + dysrhythmias Atrial Fibrillation IIRhythm:regular     Neuro/Psych PSYCHIATRIC DISORDERS Depression negative neurological ROS     GI/Hepatic negative GI ROS, Neg liver ROS,   Endo/Other  diabetes, Oral Hypoglycemic Agents  Renal/GU Renal Insufficiency and CRFRenal disease  negative genitourinary   Musculoskeletal   Abdominal   Peds  Hematology negative hematology ROS (+)   Anesthesia Other Findings See surgeon's H&P . INR= 3.0, EF 25-30%.    Reproductive/Obstetrics negative OB ROS                      Anesthesia Physical Anesthesia Plan  ASA: III  Anesthesia Plan: General   Post-op Pain Management:    Induction: Intravenous  Airway Management Planned: Mask  Additional Equipment:   Intra-op Plan:   Post-operative Plan:   Informed Consent: I have reviewed the patients History and Physical, chart, labs and discussed the procedure including the risks, benefits and alternatives for the proposed anesthesia with the patient or authorized representative who has indicated his/her understanding and acceptance.   Dental Advisory Given  Plan Discussed with: CRNA and Surgeon  Anesthesia Plan Comments:         Anesthesia Quick Evaluation

## 2014-06-04 NOTE — Interval H&P Note (Signed)
History and Physical Interval Note:  06/04/2014 10:07 AM  [name]  has presented today for surgery, with the diagnosis of AFIB  The various methods of treatment have been discussed with the patient and family. After consideration of risks, benefits and other options for treatment, the patient has consented to  Procedure(s): TRANSESOPHAGEAL ECHOCARDIOGRAM (TEE) (N/A) CARDIOVERSION (N/A) as a surgical intervention .  The patient's history has been reviewed, patient examined, no change in status, stable for surgery.  I have reviewed the patient's chart and labs.  Questions were answered to the patient's satisfaction.  INR therapeutic > 4 weeks.    [name]

## 2014-06-04 NOTE — H&P (View-Only) (Signed)
Patient ID: [name], male   DOB: [birth year], 63 y.o.   MRN: [id number] PCP: [facility] Primary Cardiologist: Dr. Tresa Endo  HPI: [name] is a 63 yo male with a history of afib, depression, DM2, CKD stage III, obesity, OSA and chronic systolic HF.  He was admitted 6/30-05/16/14 with dyspena and LE edema. Found to be in afib with RVR. Diuresed with IV lasix and d/c weight 234 lbs.   Post Hospital Follow up for Heart Failure: Doing well since being home other than very bored and has nothing to do at home. Denies SOB, orthopnea, PND or CP. Walking 30 minutes a day with no issues. Able to go up hills. Weight at home 228-225 lbs. Following low salt diet and drinking less than 2L a day. Complaints of itching all over his whole body.   ROS: All systems negative except as listed in HPI, PMH and Problem List.  SH:  History   Social History  . Marital Status: Divorced    Spouse Name: N/A    Number of Children: N/A  . Years of Education: N/A   Occupational History  . Not on file.   Social History Main Topics  . Smoking status: Former Games developer  . Smokeless tobacco: Not on file     Comment: Smoked when he was in his teens  . Alcohol Use: No  . Drug Use: No  . Sexual Activity: Not on file   Other Topics Concern  . Not on file   Social History Narrative   Lives in [place] by himself. Retired from [employer] and [employer] for [employer].     FH:  Family History  Problem Relation Age of Onset  . Heart attack Mother     deceased  . Diabetes Mother     Past Medical History  Diagnosis Date  . A-fib   . Depression   . Type 2 diabetes mellitus   . CKD (chronic kidney disease) stage 3, GFR 30-59 ml/min   . OSA (obstructive sleep apnea)   . Chronic systolic heart failure   . HTN (hypertension)     Current Outpatient Prescriptions  Medication Sig Dispense Refill  . calcium-vitamin D (OSCAL WITH D) 250-125 MG-UNIT per tablet Take 1 tablet by mouth  daily.      . digoxin (LANOXIN) 0.125 MG tablet Take 1 tablet (0.125 mg total) by mouth daily.  90 tablet  1  . furosemide (LASIX) 40 MG tablet Take 1 tablet (40 mg total) by mouth 2 (two) times daily.  180 tablet  1  . lisinopril (PRINIVIL,ZESTRIL) 2.5 MG tablet Take 1 tablet (2.5 mg total) by mouth daily.  90 tablet  1  . metFORMIN (GLUCOPHAGE) 500 MG tablet Take 1 tablet (500 mg total) by mouth daily with breakfast.  60 tablet  0  . metoprolol (LOPRESSOR) 100 MG tablet Take 1 tablet (100 mg total) by mouth 2 (two) times daily.  180 tablet  1  . multivitamin-iron-minerals-folic acid (CENTRUM) chewable tablet Chew 1 tablet by mouth daily.      . Omega-3 Fatty Acids (FISH OIL) 300 MG CAPS Take by mouth.      . warfarin (COUMADIN) 5 MG tablet 5 mg (1 tab) on Sundays, Tues, Thursdays & Saturdays 7.5 mg (1 & half tabs) on Mon, Wed, Fridays  120 tablet  1   No current facility-administered medications for this encounter.    Filed Vitals:   05/21/14 1303  BP: 159/86  Pulse: 82  Resp: 18  Weight: 229 lb (103.874 kg)  SpO2: 96%    PHYSICAL EXAM: General:  Well appearing. No resp difficulty HEENT: normal Neck: supple. JVP difficult to assess d/t body habitus but appears flat. Carotids 2+ bilaterally; no bruits. No lymphadenopathy or thryomegaly appreciated. Cor: PMI normal. Regular rate & irregular rhythm. No rubs, gallops or murmurs. Lungs: clear Abdomen: soft, obese, nontender, nondistended. No hepatosplenomegaly. No bruits or masses. Good bowel sounds. Extremities: no cyanosis, clubbing, rash, bilateral maculopapular rash, no edema Neuro: alert & orientedx3, cranial nerves grossly intact. Moves all 4 extremities w/o difficulty. Affect pleasant.   ASSESSMENT & PLAN:  1) Chronic systolic HF: EF 25-30%, diff HK, RV mildly dilated and sys. fx mildly/mod reduced (05/2014) - Reviewed discharge summary and patient recently in the hospital with acute HF new onset and afib with RVR which was  also new. He had not seen a healthcare provider in more than 10 years. Discharge weight 234 lbs. Cardiolyoptahy likely tachy induced r/t afib with RVR.  - NYHA II symptoms and volume status stable. Will continue lasix 40 mg BID. Discussed the use of sliding scale diuretics. Check BMET today. - Will stop lopressor and start coreg 12.5 mg BID. Told to call if any dizziness or fatigue. - Increase lisinopril to 2.5 mg BID. Check BMET 2 weeks. - Continue digoxin 0.125 mg daily, will check dig level today. - Not on Spiro can consider adding next visit. - He has not had an ischemic work-up. Will need to do LHC eventually. Will wait until after 1 month post TEE-DC-CV.  - Will need repeat ECHO or cMRI in 08/2014 to reassess EF. If remains less than 35% will refer to EP for ICD. QRS narrow so no CRT-D. - Reinforced the need and importance of daily weights, a low sodium diet, and fluid restriction (less than 2 L a day). Instructed to call the HF clinic if weight increases more than 3 lbs overnight or 5 lbs in a week.  2) HTN - Elevated. As above change to coreg and increase lisinopril. Continue to follow 3) Afib - New to patient. He presented to hospital with afib with RVR. Controlled on BB. No s/s of bleeding, will continue coumadin. Will refer to Coumadin clinic to manage and for education. Set apt up for Thursday. Could consider switching to NOAC if he gets insurance down the road. He has never had cardioversion. Will set up for TEE-DC-CV next week. 4) ?OSA - Reports he was diagnosed with OSA in the past, however he does not have CPAP machine. Will eventually need sleep study but will hold off for now to see if he gets [employer] Card.  Total time spent 40 minutes with greater than 50% spent on coordination of care and discussing the above.   F/U 2 weeks Kyland No BNP-C 7:37 PM   

## 2014-06-04 NOTE — CV Procedure (Signed)
    TRANSESOPHAGEAL ECHOCARDIOGRAM GUIDED DIRECT CURRENT CARDIOVERSION  NAME:  [name]   MRN: [id number] DOB:  May 04, 1951   ADMIT DATE: 06/04/2014  INDICATIONS: Atrial fib   PROCEDURE:   Informed consent was obtained prior to the procedure. The risks, benefits and alternatives for the procedure were discussed and the patient comprehended these risks.  Risks include, but are not limited to, cough, sore throat, vomiting, nausea, somnolence, esophageal and stomach trauma or perforation, bleeding, low blood pressure, aspiration, pneumonia, infection, trauma to the teeth and death.    After a procedural time-out, the patient was sedated by the anesthesia service. The oropharynx was anesthetized 10 cc of topical 1% viscous lidocaine.  The transesophageal probe was inserted in the esophagus and stomach without difficulty and multiple views were obtained.     FINDINGS:  LEFT VENTRICLE: EF = 35% with global HK worse in septum and anteroseptum  RIGHT VENTRICLE: Normal  LEFT ATRIUM: Markedly dilated. Diameter 6.2 cm  LEFT ATRIAL APPENDAGE: No clot  RIGHT ATRIUM: Dilated  AORTIC VALVE:  Trileaflet. No AI/AR  MITRAL VALVE:    Normal. Trivial MR  TRICUSPID VALVE: Normal Trivial TR  PULMONIC VALVE: Normal Trivial PR  INTERATRIAL SEPTUM: No obvious ASD or PFO.   PERICARDIUM: No effusion  DESCENDING AORTA: Moderate plaque   CARDIOVERSION:     Procedure Details:  Once the TEE was complete, the patient had the defibrillator pads placed in the anterior and posterior position. The patient then underwent further sedation by the anesthesia service for cardioversion. Once an appropriate level of sedation was achieved, the patient received a single biphasic, synchronized 200J shock with prompt conversion to sinus rhythm. No apparent complications.  COMPLICATIONS:    There were no immediate complications.   CONCLUSION:   1.  Successful TEE guided cardioversion.   Given LA  size suspect he may not hold NSR for long. Will follow. Will need ischemic evaluation down the road.   Jyl Chico,MD 10:06 AM

## 2014-06-04 NOTE — Progress Notes (Signed)
Echocardiogram Echocardiogram Transesophageal has been performed.  Dorothey Baseman 06/04/2014, 11:12 AM

## 2014-06-04 NOTE — Anesthesia Postprocedure Evaluation (Signed)
  Anesthesia Post-op Note  Patient: [name]  Procedure(s) Performed: Procedure(s): TRANSESOPHAGEAL ECHOCARDIOGRAM (TEE) (N/A) CARDIOVERSION (N/A)  Patient Location: Endoscopy Unit  Anesthesia Type:MAC  Level of Consciousness: awake, alert  and oriented  Airway and Oxygen Therapy: Patient connected to nasal cannula oxygen  Post-op Pain: none  Post-op Assessment: Patient's Cardiovascular Status Stable  Post-op Vital Signs: stable  Last Vitals:  Filed Vitals:   06/04/14 0756  BP: 158/74  Pulse: 83  Temp: 36.7 C  Resp: 12    Complications: No apparent anesthesia complications

## 2014-06-04 NOTE — Progress Notes (Signed)
Late Entry: Dr Kittie Plater made aware of elevated BP , with verbal order to instruct patient drink his BP meds once he reached home.

## 2014-06-05 ENCOUNTER — Telehealth (HOSPITAL_COMMUNITY): Payer: Self-pay | Admitting: Vascular Surgery

## 2014-06-05 ENCOUNTER — Encounter (HOSPITAL_COMMUNITY): Payer: Self-pay | Admitting: Cardiology

## 2014-06-06 ENCOUNTER — Telehealth: Payer: Self-pay | Admitting: Licensed Clinical Social Worker

## 2014-06-06 NOTE — Telephone Encounter (Signed)
CSW contacted patient to follow up on upcoming [facility] card appointment. Patient aware of appointment and needed items. Patient clarified upcoming clinic appointments and will contact CSW if further needs arise. Lasandra Beech, LCSW [phone]

## 2014-06-08 ENCOUNTER — Ambulatory Visit: Payer: Self-pay | Attending: Internal Medicine

## 2014-06-14 ENCOUNTER — Ambulatory Visit (INDEPENDENT_AMBULATORY_CARE_PROVIDER_SITE_OTHER): Payer: Self-pay

## 2014-06-14 DIAGNOSIS — I4891 Unspecified atrial fibrillation: Secondary | ICD-10-CM

## 2014-06-14 DIAGNOSIS — Z5181 Encounter for therapeutic drug level monitoring: Secondary | ICD-10-CM

## 2014-06-14 LAB — POCT INR: INR: 3.6

## 2014-06-19 ENCOUNTER — Encounter (HOSPITAL_COMMUNITY): Payer: Self-pay

## 2014-06-19 ENCOUNTER — Ambulatory Visit (HOSPITAL_COMMUNITY)
Admission: RE | Admit: 2014-06-19 | Discharge: 2014-06-19 | Disposition: A | Discharge status: Home / Self Care | Payer: Self-pay | Source: Ambulatory Visit | Attending: Internal Medicine | Admitting: Internal Medicine

## 2014-06-19 ENCOUNTER — Encounter: Payer: Self-pay | Admitting: Licensed Clinical Social Worker

## 2014-06-19 VITALS — BP 138/80 | HR 52 | Wt 212.4 lb

## 2014-06-19 DIAGNOSIS — I10 Essential (primary) hypertension: Secondary | ICD-10-CM

## 2014-06-19 DIAGNOSIS — N183 Chronic kidney disease, stage 3 unspecified: Secondary | ICD-10-CM | POA: Insufficient documentation

## 2014-06-19 DIAGNOSIS — Z8249 Family history of ischemic heart disease and other diseases of the circulatory system: Secondary | ICD-10-CM | POA: Insufficient documentation

## 2014-06-19 DIAGNOSIS — Z09 Encounter for follow-up examination after completed treatment for conditions other than malignant neoplasm: Secondary | ICD-10-CM | POA: Insufficient documentation

## 2014-06-19 DIAGNOSIS — I4891 Unspecified atrial fibrillation: Secondary | ICD-10-CM | POA: Insufficient documentation

## 2014-06-19 DIAGNOSIS — I5022 Chronic systolic (congestive) heart failure: Secondary | ICD-10-CM | POA: Insufficient documentation

## 2014-06-19 DIAGNOSIS — Z7901 Long term (current) use of anticoagulants: Secondary | ICD-10-CM | POA: Insufficient documentation

## 2014-06-19 DIAGNOSIS — I129 Hypertensive chronic kidney disease with stage 1 through stage 4 chronic kidney disease, or unspecified chronic kidney disease: Secondary | ICD-10-CM | POA: Insufficient documentation

## 2014-06-19 DIAGNOSIS — G4733 Obstructive sleep apnea (adult) (pediatric): Secondary | ICD-10-CM | POA: Insufficient documentation

## 2014-06-19 DIAGNOSIS — Z79899 Other long term (current) drug therapy: Secondary | ICD-10-CM | POA: Insufficient documentation

## 2014-06-19 DIAGNOSIS — E119 Type 2 diabetes mellitus without complications: Secondary | ICD-10-CM | POA: Insufficient documentation

## 2014-06-19 DIAGNOSIS — Z87891 Personal history of nicotine dependence: Secondary | ICD-10-CM | POA: Insufficient documentation

## 2014-06-19 LAB — BASIC METABOLIC PANEL
ANION GAP: 12 (ref 5–15)
BUN: 19 mg/dL (ref 6–23)
CALCIUM: 9.4 mg/dL (ref 8.4–10.5)
CHLORIDE: 100 meq/L (ref 96–112)
CO2: 29 meq/L (ref 19–32)
Creatinine, Ser: 1.21 mg/dL (ref 0.50–1.35)
GFR calc Af Amer: 72 mL/min — ABNORMAL LOW (ref >90)
GFR calc non Af Amer: 62 mL/min — ABNORMAL LOW (ref >90)
GLUCOSE: 141 mg/dL — AB (ref 70–99)
POTASSIUM: 3.9 meq/L (ref 3.7–5.3)
SODIUM: 141 meq/L (ref 137–147)

## 2014-06-19 LAB — DIGOXIN LEVEL: Digoxin Level: 0.7 ng/mL — ABNORMAL LOW (ref 0.8–2.0)

## 2014-06-19 MED ORDER — FUROSEMIDE 40 MG PO TABS
ORAL_TABLET | ORAL | Status: DC
Start: 2014-06-19 — End: 2016-01-10

## 2014-06-19 MED ORDER — SPIRONOLACTONE 25 MG PO TABS: 12.5000 mg | ORAL_TABLET | Freq: Every day | ORAL | Status: DC

## 2014-06-19 NOTE — Progress Notes (Signed)
CSW met with patient in the clinic to assist with questions regarding recent application for [facility] card. CSW contacted financial counseling and informed patient submitted documents needed and outcome should be determined shortly. CSW will receive return call with determination and follow up with patient. Raquel Sarna, [place]

## 2014-06-19 NOTE — Patient Instructions (Addendum)
Doing well.  Try to start walking again everyday.  Start spironolactone 12.5 mg (1/2 tablet) every day. Will check renal function at coumadin clinic appointment on 06/28/14.  Bring all your medications to your next visit.   Decrease your lasix to 40 mg in the morning and 20 mg in the evening.  Stop your metoprolol.   Go home and call us about new medication you picked up.  F/U 1 month with MD.  Do the following things EVERYDAY: 1) Weigh yourself in the morning before breakfast. Write it down and keep it in a log. 2) Take your medicines as prescribed 3) Eat low salt foods-Limit salt (sodium) to 2000 mg per day.  4) Stay as active as you can everyday 5) Limit all fluids for the day to less than 2 liters 6)

## 2014-06-19 NOTE — Progress Notes (Signed)
Patient ID: [name], male   DOB: 1951-07-18, 63 y.o.   MRN: [id number] [facility] Primary Cardiologist: Dr. Tresa EndoKelly  HPI: [name] is a 63 yo male with a history of afib, depression, DM2, CKD stage III, obesity, OSA and chronic systolic HF.  He was admitted 6/30-05/16/14 with dyspena and LE edema. Found to be in afib with RVR. Diuresed with IV lasix and d/c weight 234 lbs.   Follow up for Heart Failure: Last visit held lasix for 2 days and was scheduled for TEE-DC-CV which was successful. Concern that with LA size may not hold in NSR for long. Doing ok. Denies SOB, orthopnea, PND or CP. No longer walking every day he is not sure why he stopped. Walked all the way from ED parking lot to our clinic with no issues. Weight at home 210 lbs. Taking medications as prescribed.   ROS: All systems negative except as listed in HPI, PMH and Problem List.  SH:  History   Social History  . Marital Status: Divorced    Spouse Name: N/A    Number of Children: N/A  . Years of Education: N/A   Occupational History  . Not on file.   Social History Main Topics  . Smoking status: Former Smoker    Types: Cigars    Quit date: 11/09/2013  . Smokeless tobacco: Not on file     Comment: Smoked when he was in his teens  . Alcohol Use: No  . Drug Use: No  . Sexual Activity: Not on file   Other Topics Concern  . Not on file   Social History Narrative   Lives in [place] by himself. Retired from [employer] for [employer].     FH:  Family History  Problem Relation Age of Onset  . Heart attack Mother     deceased  . Diabetes Mother     Past Medical History  Diagnosis Date  . A-fib   . Depression   . Type 2 diabetes mellitus   . CKD (chronic kidney disease) stage 3, GFR 30-59 ml/min   . OSA (obstructive sleep apnea)   . Chronic systolic heart failure     a. ECHO (05/2014): EF 25-30%, diff HK, RV midly dilated and sys fx mildly/mod reduced  .  HTN (hypertension)     Current Outpatient Prescriptions  Medication Sig Dispense Refill  . calcium-vitamin D (OSCAL WITH D) 250-125 MG-UNIT per tablet Take 1 tablet by mouth daily.      . carvedilol (COREG) 12.5 MG tablet Take 1 tablet (12.5 mg total) by mouth 2 (two) times daily with a meal.  60 tablet  3  . digoxin (LANOXIN) 0.125 MG tablet Take 1 tablet (0.125 mg total) by mouth daily.  30 tablet  3  . furosemide (LASIX) 40 MG tablet Take 1 tablet (40 mg total) by mouth 2 (two) times daily.  60 tablet  3  . hydrOXYzine (ATARAX/VISTARIL) 25 MG tablet Take 1 tablet (25 mg total) by mouth every 8 (eight) hours as needed for itching.  30 tablet  0  . lisinopril (PRINIVIL,ZESTRIL) 2.5 MG tablet Take 1 tablet (2.5 mg total) by mouth 2 (two) times daily.  60 tablet  3  . metFORMIN (GLUCOPHAGE) 500 MG tablet Take 1 tablet (500 mg total) by mouth daily with breakfast.  60 tablet  0  . multivitamin-iron-minerals-folic acid (CENTRUM) chewable tablet Chew 1 tablet by mouth daily.      .Marland Kitchen  Omega-3 Fatty Acids (FISH OIL) 300 MG CAPS Take by mouth.      . permethrin (ELIMITE) 5 % cream Apply from chin to toes, leave 8 hours, then shower. Repeat x 1 in one week.  60 g  1  . warfarin (COUMADIN) 5 MG tablet 5 mg (1 tab) on Sundays, Tues, Thursdays & Saturdays 7.5 mg (1 & half tabs) on Mon, Wed, Fridays  120 tablet  1   No current facility-administered medications for this encounter.    Filed Vitals:   06/19/14 1126  BP: 138/80  Pulse: 52  Weight: 212 lb 6 oz (96.333 kg)  SpO2: 96%    PHYSICAL EXAM: General:  Well appearing. No resp difficulty HEENT: normal Neck: supple. JVP difficult to assess d/t body habitus but appears flat. Carotids 2+ bilaterally; no bruits. No lymphadenopathy or thryomegaly appreciated. Cor: PMI normal. Regular rate & irregular rhythm. No rubs, gallops or murmurs. Lungs: clear Abdomen: soft, obese, nontender, nondistended. No hepatosplenomegaly. No bruits or masses. Good  bowel sounds. Extremities: no cyanosis, clubbing, rash, bilateral maculopapular rash, no edema Neuro: alert & orientedx3, cranial nerves grossly intact. Moves all 4 extremities w/o difficulty. Affect pleasant.  EKG: SB 48 bpm  ASSESSMENT & PLAN:  1) Chronic systolic HF: EF 25-30%, diff HK, RV mildly dilated and sys. 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  - NYHA II symptoms and volume status stable. Will change lasix to 40 mg q am and 20 mg q pm. Discussed the use of sliding scale diuretics. Check BMET today.  - Continue coreg 12.5 mg BID, will not titrate with low HR. Patient said he picked up metoprolol as well and I told him to discontinue and not take and just continue coreg BID. - Continue lisinopril 2.5 mg BID.  - Continue digoxin 0.125 mg daily, will check dig level today. - Will start spironolactone 12.5 mg daily, check BMET 8/20 at Coumadin clinic appointment. - He has not had an ischemic work-up. Will need to do LHC eventually. Will wait until after 1 month post TEE-DC-CV.  - Will need repeat ECHO or cMRI in 08/2014 to reassess EF. If remains less than 35% will refer to EP for ICD. QRS narrow so no CRT-D. - Reinforced the need and importance of daily weights, a low sodium diet, and fluid restriction (less than 2 L a day). Instructed to call the HF clinic if weight increases more than 3 lbs overnight or 5 lbs in a week.  2) HTN - Elevated. As above start spironolactone 12.5 mg daily.  3) Afib - EKG today SB 48 bpm s/p post cardioversion. Continue coreg and coumadin. No s/s of bleeding, will continue coumadin. Could consider switching to NOAC if he gets insurance down the road.   4) ?OSA - Reports he was diagnosed with OSA in the past, however he does not have CPAP machine. Will eventually need sleep study but will hold off for now to see if he gets [employer].  F/U 1 month with MD Ulla Potash BNP-C 11:32 AM

## 2014-06-20 NOTE — Addendum Note (Signed)
Encounter addended by: Brad Lieurance C Nickalus Thornsberry, CCT on: 06/20/2014  9:14 AM<BR>     Documentation filed: Charges VN

## 2014-06-28 ENCOUNTER — Other Ambulatory Visit: Payer: Self-pay

## 2014-07-23 ENCOUNTER — Encounter (HOSPITAL_COMMUNITY): Payer: Self-pay

## 2014-08-10 NOTE — Telephone Encounter (Signed)
Open in error

## 2015-12-28 ENCOUNTER — Encounter (HOSPITAL_COMMUNITY): Payer: Self-pay | Admitting: Nurse Practitioner

## 2015-12-28 ENCOUNTER — Emergency Department (HOSPITAL_COMMUNITY): Payer: Medicare Other

## 2015-12-28 ENCOUNTER — Inpatient Hospital Stay (HOSPITAL_COMMUNITY)
Admission: EM | Admit: 2015-12-28 | Discharge: 2016-01-10 | DRG: 291 | Disposition: A | Discharge status: Home / Self Care | Payer: Medicare Other | Attending: Internal Medicine | Admitting: Internal Medicine

## 2015-12-28 DIAGNOSIS — Z7984 Long term (current) use of oral hypoglycemic drugs: Secondary | ICD-10-CM | POA: Diagnosis not present

## 2015-12-28 DIAGNOSIS — I130 Hypertensive heart and chronic kidney disease with heart failure and stage 1 through stage 4 chronic kidney disease, or unspecified chronic kidney disease: Secondary | ICD-10-CM | POA: Diagnosis not present

## 2015-12-28 DIAGNOSIS — N179 Acute kidney failure, unspecified: Secondary | ICD-10-CM | POA: Diagnosis present

## 2015-12-28 DIAGNOSIS — Z9119 Patient's noncompliance with other medical treatment and regimen: Secondary | ICD-10-CM

## 2015-12-28 DIAGNOSIS — Z9114 Patient's other noncompliance with medication regimen: Secondary | ICD-10-CM | POA: Diagnosis not present

## 2015-12-28 DIAGNOSIS — F333 Major depressive disorder, recurrent, severe with psychotic symptoms: Secondary | ICD-10-CM | POA: Diagnosis not present

## 2015-12-28 DIAGNOSIS — Z87891 Personal history of nicotine dependence: Secondary | ICD-10-CM | POA: Diagnosis not present

## 2015-12-28 DIAGNOSIS — R0602 Shortness of breath: Secondary | ICD-10-CM | POA: Diagnosis not present

## 2015-12-28 DIAGNOSIS — N183 Chronic kidney disease, stage 3 unspecified: Secondary | ICD-10-CM | POA: Diagnosis present

## 2015-12-28 DIAGNOSIS — F99 Mental disorder, not otherwise specified: Secondary | ICD-10-CM | POA: Diagnosis not present

## 2015-12-28 DIAGNOSIS — I236 Thrombosis of atrium, auricular appendage, and ventricle as current complications following acute myocardial infarction: Secondary | ICD-10-CM | POA: Diagnosis present

## 2015-12-28 DIAGNOSIS — M7989 Other specified soft tissue disorders: Secondary | ICD-10-CM | POA: Diagnosis present

## 2015-12-28 DIAGNOSIS — R7989 Other specified abnormal findings of blood chemistry: Secondary | ICD-10-CM | POA: Diagnosis not present

## 2015-12-28 DIAGNOSIS — I5043 Acute on chronic combined systolic (congestive) and diastolic (congestive) heart failure: Secondary | ICD-10-CM | POA: Diagnosis not present

## 2015-12-28 DIAGNOSIS — I071 Rheumatic tricuspid insufficiency: Secondary | ICD-10-CM | POA: Diagnosis present

## 2015-12-28 DIAGNOSIS — I483 Typical atrial flutter: Secondary | ICD-10-CM | POA: Diagnosis not present

## 2015-12-28 DIAGNOSIS — E1165 Type 2 diabetes mellitus with hyperglycemia: Secondary | ICD-10-CM | POA: Diagnosis present

## 2015-12-28 DIAGNOSIS — I5023 Acute on chronic systolic (congestive) heart failure: Secondary | ICD-10-CM | POA: Diagnosis not present

## 2015-12-28 DIAGNOSIS — I272 Other secondary pulmonary hypertension: Secondary | ICD-10-CM | POA: Diagnosis present

## 2015-12-28 DIAGNOSIS — Z6830 Body mass index (BMI) 30.0-30.9, adult: Secondary | ICD-10-CM

## 2015-12-28 DIAGNOSIS — E1122 Type 2 diabetes mellitus with diabetic chronic kidney disease: Secondary | ICD-10-CM | POA: Diagnosis not present

## 2015-12-28 DIAGNOSIS — I4891 Unspecified atrial fibrillation: Secondary | ICD-10-CM | POA: Diagnosis present

## 2015-12-28 DIAGNOSIS — I129 Hypertensive chronic kidney disease with stage 1 through stage 4 chronic kidney disease, or unspecified chronic kidney disease: Secondary | ICD-10-CM | POA: Diagnosis not present

## 2015-12-28 DIAGNOSIS — E669 Obesity, unspecified: Secondary | ICD-10-CM | POA: Diagnosis present

## 2015-12-28 DIAGNOSIS — I509 Heart failure, unspecified: Secondary | ICD-10-CM

## 2015-12-28 DIAGNOSIS — F32A Depression, unspecified: Secondary | ICD-10-CM | POA: Diagnosis present

## 2015-12-28 DIAGNOSIS — I481 Persistent atrial fibrillation: Secondary | ICD-10-CM | POA: Diagnosis not present

## 2015-12-28 DIAGNOSIS — I110 Hypertensive heart disease with heart failure: Secondary | ICD-10-CM | POA: Diagnosis not present

## 2015-12-28 DIAGNOSIS — I248 Other forms of acute ischemic heart disease: Secondary | ICD-10-CM | POA: Diagnosis present

## 2015-12-28 DIAGNOSIS — I4892 Unspecified atrial flutter: Secondary | ICD-10-CM | POA: Diagnosis not present

## 2015-12-28 DIAGNOSIS — N182 Chronic kidney disease, stage 2 (mild): Secondary | ICD-10-CM | POA: Diagnosis not present

## 2015-12-28 DIAGNOSIS — Z7901 Long term (current) use of anticoagulants: Secondary | ICD-10-CM | POA: Diagnosis not present

## 2015-12-28 DIAGNOSIS — G4733 Obstructive sleep apnea (adult) (pediatric): Secondary | ICD-10-CM | POA: Diagnosis present

## 2015-12-28 DIAGNOSIS — I513 Intracardiac thrombosis, not elsewhere classified: Secondary | ICD-10-CM | POA: Diagnosis not present

## 2015-12-28 DIAGNOSIS — R778 Other specified abnormalities of plasma proteins: Secondary | ICD-10-CM | POA: Insufficient documentation

## 2015-12-28 DIAGNOSIS — F329 Major depressive disorder, single episode, unspecified: Secondary | ICD-10-CM | POA: Diagnosis present

## 2015-12-28 DIAGNOSIS — R69 Illness, unspecified: Secondary | ICD-10-CM

## 2015-12-28 DIAGNOSIS — R748 Abnormal levels of other serum enzymes: Secondary | ICD-10-CM | POA: Diagnosis present

## 2015-12-28 DIAGNOSIS — I10 Essential (primary) hypertension: Secondary | ICD-10-CM | POA: Diagnosis present

## 2015-12-28 DIAGNOSIS — E1129 Type 2 diabetes mellitus with other diabetic kidney complication: Secondary | ICD-10-CM | POA: Diagnosis present

## 2015-12-28 LAB — CBC
HCT: 40.1 % (ref 39.0–52.0)
HEMOGLOBIN: 12.5 g/dL — AB (ref 13.0–17.0)
MCH: 28 pg (ref 26.0–34.0)
MCHC: 31.2 g/dL (ref 30.0–36.0)
MCV: 89.7 fL (ref 78.0–100.0)
Platelets: 183 10*3/uL (ref 150–400)
RBC: 4.47 MIL/uL (ref 4.22–5.81)
RDW: 14.9 % (ref 11.5–15.5)
WBC: 7.3 10*3/uL (ref 4.0–10.5)

## 2015-12-28 LAB — TROPONIN I: Troponin I: 0.27 ng/mL — ABNORMAL HIGH (ref <0.031)

## 2015-12-28 LAB — ETHANOL

## 2015-12-28 LAB — BASIC METABOLIC PANEL
ANION GAP: 12 (ref 5–15)
BUN: 31 mg/dL — ABNORMAL HIGH (ref 6–20)
CHLORIDE: 103 mmol/L (ref 101–111)
CO2: 24 mmol/L (ref 22–32)
Calcium: 9.1 mg/dL (ref 8.9–10.3)
Creatinine, Ser: 1.65 mg/dL — ABNORMAL HIGH (ref 0.61–1.24)
GFR calc non Af Amer: 42 mL/min — ABNORMAL LOW (ref >60)
GFR, EST AFRICAN AMERICAN: 49 mL/min — AB (ref >60)
Glucose, Bld: 162 mg/dL — ABNORMAL HIGH (ref 65–99)
POTASSIUM: 4.2 mmol/L (ref 3.5–5.1)
Sodium: 139 mmol/L (ref 135–145)

## 2015-12-28 LAB — I-STAT TROPONIN, ED: Troponin i, poc: 0.19 ng/mL (ref 0.00–0.08)

## 2015-12-28 LAB — GLUCOSE, CAPILLARY: GLUCOSE-CAPILLARY: 131 mg/dL — AB (ref 65–99)

## 2015-12-28 LAB — BRAIN NATRIURETIC PEPTIDE: B NATRIURETIC PEPTIDE 5: 886.8 pg/mL — AB (ref 0.0–100.0)

## 2015-12-28 MED ORDER — ONDANSETRON HCL 4 MG/2ML IJ SOLN: 4.0000 mg | Freq: Four times a day (QID) | INTRAMUSCULAR | Status: DC | PRN

## 2015-12-28 MED ORDER — INSULIN ASPART 100 UNIT/ML ~~LOC~~ SOLN
0.0000 [IU] | Freq: Every day | SUBCUTANEOUS | Status: DC
Start: 2015-12-28 — End: 2016-01-10

## 2015-12-28 MED ORDER — SODIUM CHLORIDE 0.9 % IV SOLN
250.0000 mL | INTRAVENOUS | Status: DC | PRN
  Administered 2015-12-30 – 2016-01-04 (×2): 250 mL via INTRAVENOUS
  Administered 2016-01-06 (×2): via INTRAVENOUS

## 2015-12-28 MED ORDER — HEPARIN (PORCINE) IN NACL 100-0.45 UNIT/ML-% IJ SOLN
1300.0000 [IU]/h | INTRAMUSCULAR | Status: DC
  Administered 2015-12-28: 1250 [IU]/h via INTRAVENOUS
  Administered 2015-12-29 – 2015-12-31 (×3): 1300 [IU]/h via INTRAVENOUS
  Filled 2015-12-28 (×4): qty 250

## 2015-12-28 MED ORDER — ENALAPRIL MALEATE 5 MG PO TABS: 5.0000 mg | ORAL_TABLET | Freq: Two times a day (BID) | ORAL | Status: DC

## 2015-12-28 MED ORDER — HYDROXYZINE HCL 25 MG PO TABS: 25.0000 mg | ORAL_TABLET | Freq: Three times a day (TID) | ORAL | Status: DC | PRN

## 2015-12-28 MED ORDER — DIGOXIN 125 MCG PO TABS
0.1250 mg | ORAL_TABLET | Freq: Every day | ORAL | Status: DC
  Administered 2015-12-28 – 2016-01-10 (×14): 0.125 mg via ORAL
  Filled 2015-12-28 (×15): qty 1

## 2015-12-28 MED ORDER — ASPIRIN 81 MG PO CHEW
324.0000 mg | CHEWABLE_TABLET | Freq: Once | ORAL | Status: AC
Start: 2015-12-28 — End: 2015-12-28
  Administered 2015-12-28: 324 mg via ORAL
  Filled 2015-12-28: qty 4

## 2015-12-28 MED ORDER — HEPARIN BOLUS VIA INFUSION
4000.0000 [IU] | Freq: Once | INTRAVENOUS | Status: AC
  Administered 2015-12-28: 4000 [IU] via INTRAVENOUS
  Filled 2015-12-28: qty 4000

## 2015-12-28 MED ORDER — ACETAMINOPHEN 325 MG PO TABS: 650.0000 mg | ORAL_TABLET | ORAL | Status: DC | PRN

## 2015-12-28 MED ORDER — FUROSEMIDE 10 MG/ML IJ SOLN
80.0000 mg | Freq: Two times a day (BID) | INTRAMUSCULAR | Status: DC
  Administered 2015-12-28 – 2015-12-30 (×5): 80 mg via INTRAVENOUS
  Filled 2015-12-28 (×5): qty 8

## 2015-12-28 MED ORDER — SODIUM CHLORIDE 0.9% FLUSH
3.0000 mL | Freq: Two times a day (BID) | INTRAVENOUS | Status: DC
  Administered 2015-12-29 – 2016-01-08 (×11): 3 mL via INTRAVENOUS

## 2015-12-28 MED ORDER — SODIUM CHLORIDE 0.9% FLUSH: 3.0000 mL | INTRAVENOUS | Status: DC | PRN

## 2015-12-28 MED ORDER — HEPARIN SODIUM (PORCINE) 5000 UNIT/ML IJ SOLN: 5000.0000 [IU] | Freq: Three times a day (TID) | INTRAMUSCULAR | Status: DC

## 2015-12-28 MED ORDER — INSULIN ASPART 100 UNIT/ML ~~LOC~~ SOLN
0.0000 [IU] | Freq: Three times a day (TID) | SUBCUTANEOUS | Status: DC
  Administered 2015-12-29 – 2016-01-08 (×12): 2 [IU] via SUBCUTANEOUS

## 2015-12-28 NOTE — Progress Notes (Signed)
ANTICOAGULATION CONSULT NOTE - Initial Consult  Pharmacy Consult for Heparin Indication: atrial fibrillation  No Known Allergies  Patient Measurements: Height: 5\' 8"  (172.7 cm) Weight: 232 lb (105.235 kg) IBW/kg (Calculated) : 68.4 Heparin Dosing Weight: 91.5 kg  Vital Signs: Temp: 98.1 F (36.7 C) (02/18 1200) Temp Source: Oral (02/18 1200) BP: 128/96 mmHg (02/18 1630) Pulse Rate: 122 (02/18 1700)  Labs:  Recent Labs  12/28/15 1219  HGB 12.5*  HCT 40.1  PLT 183  CREATININE 1.65*    CrCl cannot be calculated (Unknown ideal weight.).   Medical History: Past Medical History  Diagnosis Date  . A-fib (HCC)     a. (06/04/14) TEE-DC-CV; succesful; large LA 6.2 cm  . Depression   . Type 2 diabetes mellitus (HCC)   . CKD (chronic kidney disease) stage 3, GFR 30-59 ml/min   . OSA (obstructive sleep apnea)   . Chronic systolic heart failure (HCC)     a. ECHO (05/2014): EF 25-30%, diff HK, RV midly dilated and sys fx mildly/mod reduced  . HTN (hypertension)     Medications:   (Not in a hospital admission) Scheduled:  . digoxin  0.125 mg Oral Daily  . enalapril  5 mg Oral BID  . furosemide  80 mg Intravenous BID  . [START ON 12/29/2015] insulin aspart  0-15 Units Subcutaneous TID WC  . insulin aspart  0-5 Units Subcutaneous QHS  . sodium chloride flush  3 mL Intravenous Q12H   Infusions:  . sodium chloride      Assessment: 65yo male with history of Afib not taking his warfarin PTA presents with edema. Pharmacy is consulted to dose heparin for atrial fibrillation. Pt reports that he doesn't take any of his home meds.  Goal of Therapy:  Heparin level 0.3-0.7 units/ml Monitor platelets by anticoagulation protocol: Yes   Plan:  Give 4000 units bolus x 1 Start heparin infusion at 1250 units/hr Check anti-Xa level in 6 hours and daily while on heparin Continue to monitor H&H and platelets  Arlean Hopping. Newman Pies, PharmD, BCPS Clinical Pharmacist Pager  [phone] 12/28/2015,5:20 PM

## 2015-12-28 NOTE — Consult Note (Signed)
CARDIOLOGY ADMISSION NOTE  Patient ID: [name] MRN: [id number] DOB/AGE: 1951-04-09 65 y.o.  Admit date: 12/28/2015 Primary Physician   Does not see primary care  Primary Cardiologist   Dr. Gala Romney Chief Complaint    Edema  HPI:  The patient has a history of CHF followed in the HF clinic.  However, he has not bee seen in a few years.  He has been out of meds since 2015.  For unclear reasons he has not seen any doctors.  He has some insight but expresses concerns about his situation as being hopeless.  He jokes about getting "curare or cyanide".  He came to the ED apparently only after leg swelling got so severe that he was uncomfortable.  He does not report chest pain or neck pain.  He has some rapid heart rates but no presyncope or syncope.  He apparently is very sedentary but can walk around in his house.  He does not describe PND or orthopnea.    Past Medical History  Diagnosis Date  . A-fib (HCC)     a. (06/04/14) TEE-DC-CV; succesful; large LA 6.2 cm  . Depression   . Type 2 diabetes mellitus (HCC)   . CKD (chronic kidney disease) stage 3, GFR 30-59 ml/min   . OSA (obstructive sleep apnea)   . Chronic systolic heart failure (HCC)     a. ECHO (05/2014): EF 25-30%, diff HK, RV midly dilated and sys fx mildly/mod reduced  . HTN (hypertension)     Past Surgical History  Procedure Laterality Date  . Vasectomy    . Nose surgery      Nasal septum surgery  . Tee without cardioversion N/A 06/04/2014    Procedure: TRANSESOPHAGEAL ECHOCARDIOGRAM (TEE);  Surgeon: Laurey Morale, MD;  Location: [facility] ENDOSCOPY;  Service: Cardiovascular;  Laterality: N/A;  . Cardioversion N/A 06/04/2014    Procedure: CARDIOVERSION;  Surgeon: Laurey Morale, MD;  [facility] END[facility]COPY;  Service: Cardiovascular;  Laterality: N/A;    No Known Allergies No current facility-administered medications on file prior to encounter.   MEDS (He has not been taking any of these meds) Current Outpatient  Prescriptions on File Prior to Encounter  Medication Sig Dispense Refill  . carvedilol (COREG) 12.5 MG tablet Take 1 tablet (12.5 mg total) by mouth 2 (two) times daily with a meal. (Patient not taking: Reported on 12/28/2015) 60 tablet 3  . digoxin (LANOXIN) 0.125 MG tablet Take 1 tablet (0.125 mg total) by mouth daily. (Patient not taking: Reported on 12/28/2015) 30 tablet 3  . furosemide (LASIX) 40 MG tablet Take 40 mg (1 tablet) in the morning and 20 mg (1/2 tablet) in the evening. (Patient not taking: Reported on 12/28/2015) 45 tablet 3  . hydrOXYzine (ATARAX/VISTARIL) 25 MG tablet Take 1 tablet (25 mg total) by mouth every 8 (eight) hours as needed for itching. (Patient not taking: Reported on 12/28/2015) 30 tablet 0  . lisinopril (PRINIVIL,ZESTRIL) 2.5 MG tablet Take 1 tablet (2.5 mg total) by mouth 2 (two) times daily. (Patient not taking: Reported on 12/28/2015) 60 tablet 3  . metFORMIN (GLUCOPHAGE) 500 MG tablet Take 1 tablet (500 mg total) by mouth daily with breakfast. (Patient not taking: Reported on 12/28/2015) 60 tablet 0  . permethrin (ELIMITE) 5 % cream Apply from chin to toes, leave 8 hours, then shower. Repeat x 1 in one week. (Patient not taking: Reported on 12/28/2015) 60 g 1  . spironolactone (ALDACTONE) 25 MG tablet Take 0.5 tablets (12.5  mg total) by mouth daily. (Patient not taking: Reported on 12/28/2015) 45 tablet 3  . warfarin (COUMADIN) 5 MG tablet 5 mg (1 tab) on Sundays, Tues, Thursdays & Saturdays 7.5 mg (1 & half tabs) on Mon, Wed, Fridays (Patient not taking: Reported on 12/28/2015) 120 tablet 1   Social History   Social History  . Marital Status: Divorced    Spouse Name: N/A  . Number of Children: N/A  . Years of Education: N/A   Occupational History  . Not on file.   Social History Main Topics  . Smoking status: Former Smoker    Types: Cigars    Quit date: 11/09/2013  . Smokeless tobacco: Not on file     Comment: Smoked when he was in his teens  . Alcohol Use:  No  . Drug Use: No  . Sexual Activity: Not on file   Other Topics Concern  . Not on file   Social History Narrative   Lives in [place] by himself. Retired from [employer] for [employer].     Family History  Problem Relation Age of Onset  . Heart attack Mother     deceased  . Diabetes Mother      ROS:  As stated in the HPI and negative for all other systems.  Physical Exam: Blood pressure 144/100, pulse 123, temperature 98.1 F (36.7 C), temperature source Oral, resp. rate 20, SpO2 95 %.  GENERAL:  Not in acute distress, disheveled appearing HEENT:  Pupils equal round and reactive, fundi not visualized, oral mucosa unremarkable NECK:  Positive jugular venous distention, waveform within normal limits, carotid upstroke brisk and symmetric, no bruits, no thyromegaly LYMPHATICS:  No cervical, inguinal adenopathy LUNGS:  Clear to auscultation bilaterally BACK:  No CVA tenderness CHEST:  Unremarkable HEART:  PMI not displaced or sustained,S1 and S2 within normal limits, no S3, no clicks, no rubs, no murmurs ABD:  Flat, positive bowel sounds normal in frequency in pitch, no bruits, no rebound, no guarding, no midline pulsatile mass, positive hepatomegaly, no splenomegaly EXT:  2 plus pulses throughout, severe diffuse edema, no cyanosis no clubbing, missing left thumbl   SKIN:  No rashes no nodules NEURO:  Cranial nerves II through XII grossly intact, motor grossly intact throughout PSYCH:  Cognitively intact, oriented to person place and time   Labs: Lab Results  Component Value Date   BUN 31* 12/28/2015   Lab Results  Component Value Date   CREATININE 1.65* 12/28/2015   Lab Results  Component Value Date   NA 139 12/28/2015   K 4.2 12/28/2015   CL 103 12/28/2015   CO2 24 12/28/2015    Lab Results  Component Value Date   WBC 7.3 12/28/2015   HGB 12.5* 12/28/2015   HCT 40.1 12/28/2015   MCV 89.7 12/28/2015   PLT 183 12/28/2015      Radiology:   CXR:  Stable cardiomegaly with slight vascular congestion. Minor streaky bibasilar densities and trace pleural effusions. No definite focal pneumonia. No current CHF or pneumothorax. Trachea midline. Degenerative changes of the spine.  EKG:  Atrial flutter 2:1  Rate 123  12/28/2015   ASSESSMENT AND PLAN:    ACUTE ON CHRONIC SYSTOLIC CHF:  EF 25% in 2015.  He will need an echo.  Begin diuresis but need to follow BMET closely.  Start ACE but hold off on beta blocker as he is decompensated at present.  He will need to be slowly titrated on his meds.Marland Kitchen  Start ACE inhibitor enalapril 5 mg bid.  Give Lasix 80 mg bid IV to begin with.  Order echocardiogram  HTN:  This is being managed in the context of treating his CHF  ATRIAL FIB:  He had a TEE DCCV in July 2015.  Now in atrial flutter with 2:1 conduction.  He should ultimately have DCCV again but only if he can be compliant with anticoagulation  OSA:  Needs formal diagnosis and treatment.   CKD:  Follow creat closely  DM:  Per primary team  ELEVATED TROPONIN:  Doubt acute ischemic syndrome.  He should be started on heparin until we decide about chronic anticoagulation.    SOCIAL/PSYCH:  I would suggest a formal psych evaluation this admission.  He expressed some hopeless thoughts to me.  Certainly he has severe psychosocial issues affecting his health.  Perhaps the most important outcome of this hospitalization will be getting him meds, primary care follow up and psychiatric evaluation and work up/consultation by social workers.

## 2015-12-28 NOTE — H&P (Signed)
[facility] History and Physical  STAFFORD RIVIERA ZOX:[id number] DOB: 1951/06/20 DOA: 12/28/2015  Referring physician:  PCP: [name], MD   Chief Complaint: lower extremity edema  HPI: [name] is a 66 y.o. male with a past medical history that includes type 2 diabetes uncontrolled, chronic kidney disease stage III, obesity, lower extremity edema, hypertension, chronic systolic heart failure, obstructive sleep apnea A. fib noncompliant with Coumadin presents to the emergency department with chief complaint of worsening lower extremity edema. Initial evaluation reveals acute on chronic systolic heart failure with an elevated troponin.  Information is obtained from the patient. He reports noncompliance with medication and follow-up medical care since 2015. He states he came to the emergency department when his legs swelled to the point that it caused him pain and made it difficult to walk. He denies any chest pain palpitations headache dizziness syncope or near-syncope. He denies orthopnea cough nausea vomiting. He denies dysuria hematuria frequency or urgency. He denies any recent fever chills or sick contacts  Emergency department he's afebrile hemodynamically stable and not hypoxic   Review of Systems:  10 point review of systems complete and all systems are negative except as indicated in the history of present illness  Past Medical History  Diagnosis Date  . A-fib (HCC)     a. (06/04/14) TEE-DC-CV; succesful; large LA 6.2 cm  . Depression   . Type 2 diabetes mellitus (HCC)   . CKD (chronic kidney disease) stage 3, GFR 30-59 ml/min   . OSA (obstructive sleep apnea)   . Chronic systolic heart failure (HCC)     a. ECHO (05/2014): EF 25-30%, diff HK, RV midly dilated and sys fx mildly/mod reduced  . HTN (hypertension)    Past Surgical History  Procedure Laterality Date  . Vasectomy    . Nose surgery      Nasal septum surgery  . Tee without cardioversion N/A  06/04/2014    Procedure: TRANSESOPHAGEAL ECHOCARDIOGRAM (TEE);  Surgeon: Laurey Morale, MD;  Location: [facility] ENDOSCOPY;  Service: Cardiovascular;  Laterality: N/A;  . Cardioversion N/A 06/04/2014    Procedure: CARDIOVERSION;  Surgeon: Laurey Morale, MD;  Location: [facility] ENDOSCOPY;  Service: Cardiovascular;  Laterality: N/A;   Social History:  reports that he has quit smoking. His smoking use included Cigars. He does not have any smokeless tobacco history on file. He reports that he does not drink alcohol or use illicit drugs. He lives in [place] he has a housemaid he denies smoking drinking or any other drugs. He appears independent with ADLs No Known Allergies  Family History  Problem Relation Age of Onset  . Heart attack Mother     deceased  . Diabetes Mother      Prior to Admission medications   Medication Sig Start Date End Date Taking? Authorizing Provider  carvedilol (COREG) 12.5 MG tablet Take 1 tablet (12.5 mg total) by mouth 2 (two) times daily with a meal. Patient not taking: Reported on 12/28/2015 05/21/14   Aundria Rud, NP  digoxin (LANOXIN) 0.125 MG tablet Take 1 tablet (0.125 mg total) by mouth daily. Patient not taking: Reported on 12/28/2015 05/21/14   Aundria Rud, NP  furosemide (LASIX) 40 MG tablet Take 40 mg (1 tablet) in the morning and 20 mg (1/2 tablet) in the evening. Patient not taking: Reported on 12/28/2015 06/19/14   Aundria Rud, NP  hydrOXYzine (ATARAX/VISTARIL) 25 MG tablet Take 1 tablet (25 mg total) by mouth every 8 (eight) hours as  needed for itching. Patient not taking: Reported on 12/28/2015 05/27/14   Mathis Fare Presson, PA  lisinopril (PRINIVIL,ZESTRIL) 2.5 MG tablet Take 1 tablet (2.5 mg total) by mouth 2 (two) times daily. Patient not taking: Reported on 12/28/2015 05/21/14   Aundria Rud, NP  metFORMIN (GLUCOPHAGE) 500 MG tablet Take 1 tablet (500 mg total) by mouth daily with breakfast. Patient not taking: Reported on 12/28/2015 05/15/14   Hollice Espy, MD  permethrin (ELIMITE) 5 % cream Apply from chin to toes, leave 8 hours, then shower. Repeat x 1 in one week. Patient not taking: Reported on 12/28/2015 05/27/14   Ria Clock, PA  spironolactone (ALDACTONE) 25 MG tablet Take 0.5 tablets (12.5 mg total) by mouth daily. Patient not taking: Reported on 12/28/2015 06/19/14   Aundria Rud, NP  warfarin (COUMADIN) 5 MG tablet 5 mg (1 tab) on Sundays, Tues, Thursdays & Saturdays 7.5 mg (1 & half tabs) on Mon, Wed, Fridays Patient not taking: Reported on 12/28/2015 05/15/14   Hollice Espy, MD   Physical Exam: Filed Vitals:   12/28/15 1505 12/28/15 1545 12/28/15 1630 12/28/15 1700  BP:  129/99 128/96   Pulse: 123 121 122 122  Temp:      TempSrc:      Resp: 20 23 18 19   SpO2: 95% 99% 100% 100%    Wt Readings from Last 3 Encounters:  06/19/14 96.333 kg (212 lb 6 oz)  05/21/14 103.874 kg (229 lb)  05/16/14 105.6 kg (232 lb 12.9 oz)    General:  Appears calm and comfortable sitting on the side of the bed eating a sandwich Eyes: PERRL, normal lids, irises & conjunctiva ENT: grossly normal hearing, lips & tongue Neck: no LAD, masses or thyromegaly Cardiovascular: Tachycardic but regular, no m/r/g. 1-2+ pitting LE edema. Telemetry: SR, no arrhythmias  Respiratory: CTA bilaterally, no w/r/r. Normal respiratory effort. Abdomen: soft, ntnd abuse positive bowel sounds Skin: no rash or induration seen on limited exam Musculoskeletal: grossly normal tone BUE/BLE Psychiatric: Cooperative a defect slightly inappropriate with humor Neurologic: grossly non-focal. Speech clear facial symmetry           Labs on Admission:  Basic Metabolic Panel:  Recent Labs Lab 12/28/15 1219  NA 139  K 4.2  CL 103  CO2 24  GLUCOSE 162*  BUN 31*  CREATININE 1.65*  CALCIUM 9.1   Liver Function Tests: No results for input(s): AST, ALT, ALKPHOS, BILITOT, PROT, ALBUMIN in the last 168 hours. No results for input(s): LIPASE, AMYLASE  in the last 168 hours. No results for input(s): AMMONIA in the last 168 hours. CBC:  Recent Labs Lab 12/28/15 1219  WBC 7.3  HGB 12.5*  HCT 40.1  MCV 89.7  PLT 183   Cardiac Enzymes: No results for input(s): CKTOTAL, CKMB, CKMBINDEX, TROPONINI in the last 168 hours.  BNP (last 3 results)  Recent Labs  12/28/15 1449  BNP 886.8*    ProBNP (last 3 results) No results for input(s): PROBNP in the last 8760 hours.  CBG: No results for input(s): GLUCAP in the last 168 hours.  Radiological Exams on Admission: Dg Chest 2 View  12/28/2015  CLINICAL DATA:  Acute chest pain shortness of breath, diabetes, hypertension EXAM: CHEST  2 VIEW COMPARISON:  05/08/2014 FINDINGS: Stable cardiomegaly with slight vascular congestion. Minor streaky bibasilar densities and trace pleural effusions. No definite focal pneumonia. No current CHF or pneumothorax. Trachea midline. Degenerative changes of the spine. IMPRESSION: cardiomegaly with trace  pleural effusions and bibasilar atelectasis. Electronically Signed   By: Judie Petit.  Shick M.D.   On: 12/28/2015 12:45    EKG: Independently reviewed sinus tachycardia on specific T wave  Assessment/Plan Principal Problem:   Acute on chronic systolic heart failure (HCC) Active Problems:   Swelling of lower extremity   Depression   Type 2 diabetes, uncontrolled, with renal manifestation (HCC)   CKD (chronic kidney disease), stage II   Obesity (BMI 30-39.9)   HTN (hypertension)   Atrial flutter (HCC)  #1. Acute on chronic systolic heart failure. Echo in 2015 with an EF of 25%. Current BNP elevated. Chest x-ray with slight vascular congestion. Has been noncompliant with medications for almost 2 years. Evaluated by cardiology who recommends admission with aggressive diuresis -Admit to telemetry -Lasix 80 mg IV twice a day per cardiology -Enalapril 5 mg by mouth twice a day per cardiology -Bmet in the morning -Echocardiogram -Hold on beta blocker as he is  decompensated per cardiology  #2. Hypertension. Controlled in the emergency department. Home medications include Coreg Lasix lisinopril -He is not taking these medications for over a year -Low-dose ACE inhibitor as noted above -IV Lasix as noted above  #3. A. fib. Chart review indicates he has a history and has been on Coumadin the past. In addition he had a TEE DCCV July 2015. Evaluated by cardiology and opined currently an a flutter with a 21 conduction. Recommend that he ultimately have DCCV again if he can be compliant with anticoagulation -Heparin gtt per cardiology  #4. Elevated troponin. Initial troponin 0.19. Denies chest pain. EKG without acute changes. Evaluated by cardiology who opined not likely acute ischemic syndrome but recommended heparin drip be started until decision about chronic anticoagulation made. Of note patient with history of noncompliance -Heparin drip per pharmacy -Cycle troponin  5. Acute on Chronic kidney disease. Stage III. Creatinine 1.6 on admission. This is slightly above baseline. -Will monitor closely given need for IV Lasix -Measure urine output  6. Depression. He does verbalize some hopelessness. Denies suicidal ideation -Consider a will health consult  7. Diabetes. On oral agents in the past. Serum glucose 162 on admission -Hold metformin -Obtain hemoglobin A1c -Sliding scale  [place]  Code Status: full DVT Prophylaxis: Family Communication: none Disposition Plan: home when ready Time spent: 65 minutes  [facility] M [facility]    I have examined the patient, reviewed the chart and modified the above note which I agree with.   Chick Cousins,MD Pager # [phone] [email or web address] 01/03/2016, 6:06 PM

## 2015-12-28 NOTE — ED Provider Notes (Signed)
CSN: [id number]     Arrival date & time 12/28/15  1132 History   First MD Initiated Contact with Patient 12/28/15 1322     Chief Complaint  Patient presents with  . Leg Swelling    HPI Comments: Patient very difficult to assess as he does not seem to give a straight answer.  However, he reports that he has had LE edema for several months.  Current LE is not increased from baseline.  He reports intermittent SOB at rest and orthopnea.  Denies chest pain, nausea, vomiting, diaphoresis, abdominal pain, dizziness, visual disturbance.  He denies being on a diuretic for edema.  He does not see a cardiologist.   Nonsmoker, no ETOH or drug use per patient.  The history is provided by the patient. No language interpreter was used.    Past Medical History  Diagnosis Date  . A-fib (HCC)     a. (06/04/14) TEE-DC-CV; succesful; large LA 6.2 cm  . Depression   . Type 2 diabetes mellitus (HCC)   . CKD (chronic kidney disease) stage 3, GFR 30-59 ml/min   . OSA (obstructive sleep apnea)   . Chronic systolic heart failure (HCC)     a. ECHO (05/2014): EF 25-30%, diff HK, RV midly dilated and sys fx mildly/mod reduced  . HTN (hypertension)    Past Surgical History  Procedure Laterality Date  . Vasectomy    . Nose surgery      Nasal septum surgery  . Tee without cardioversion N/A 06/04/2014    Procedure: TRANSESOPHAGEAL ECHOCARDIOGRAM (TEE);  Surgeon: Laurey Morale, MD;  Location: [facility] ENDOSCOPY;  Service: Cardiovascular;  Laterality: N/A;  . Cardioversion N/A 06/04/2014    Procedure: CARDIOVERSION;  Surgeon: Laurey Morale, MD;  Location: [facility] ENDOSCOPY;  Service: Cardiovascular;  Laterality: N/A;   Family History  Problem Relation Age of Onset  . Heart attack Mother     deceased  . Diabetes Mother    Social History  Substance Use Topics  . Smoking status: Former Smoker    Types: Cigars  . Smokeless tobacco: None     Comment: Smoked when he was in his teens  . Alcohol Use: No    Review of  Systems  Constitutional: Negative for fever, chills and diaphoresis.  Respiratory: Positive for shortness of breath (DOE, orthopnea, occ SOB at rest.  Does not use O2). Negative for cough, chest tightness and wheezing.   Cardiovascular: Positive for leg swelling. Negative for chest pain.  Gastrointestinal: Negative for nausea, vomiting, abdominal pain and abdominal distention.  Neurological: Negative for dizziness, weakness and headaches.   Allergies  Review of patient's allergies indicates no known allergies.  Home Medications   Prior to Admission medications   Medication Sig Start Date End Date Taking? Authorizing Provider  carvedilol (COREG) 12.5 MG tablet Take 1 tablet (12.5 mg total) by mouth 2 (two) times daily with a meal. Patient not taking: Reported on 12/28/2015 05/21/14   Aundria Rud, NP  digoxin (LANOXIN) 0.125 MG tablet Take 1 tablet (0.125 mg total) by mouth daily. Patient not taking: Reported on 12/28/2015 05/21/14   Aundria Rud, NP  furosemide (LASIX) 40 MG tablet Take 40 mg (1 tablet) in the morning and 20 mg (1/2 tablet) in the evening. Patient not taking: Reported on 12/28/2015 06/19/14   Aundria Rud, NP  hydrOXYzine (ATARAX/VISTARIL) 25 MG tablet Take 1 tablet (25 mg total) by mouth every 8 (eight) hours as needed for itching. Patient not taking: Reported on  12/28/2015 05/27/14   Mathis Fare Presson, PA  lisinopril (PRINIVIL,ZESTRIL) 2.5 MG tablet Take 1 tablet (2.5 mg total) by mouth 2 (two) times daily. Patient not taking: Reported on 12/28/2015 05/21/14   Aundria Rud, NP  metFORMIN (GLUCOPHAGE) 500 MG tablet Take 1 tablet (500 mg total) by mouth daily with breakfast. Patient not taking: Reported on 12/28/2015 05/15/14   Hollice Espy, MD  permethrin (ELIMITE) 5 % cream Apply from chin to toes, leave 8 hours, then shower. Repeat x 1 in one week. Patient not taking: Reported on 12/28/2015 05/27/14   Ria Clock, PA  spironolactone (ALDACTONE) 25 MG  tablet Take 0.5 tablets (12.5 mg total) by mouth daily. Patient not taking: Reported on 12/28/2015 06/19/14   Aundria Rud, NP  warfarin (COUMADIN) 5 MG tablet 5 mg (1 tab) on Sundays, Tues, Thursdays & Saturdays 7.5 mg (1 & half tabs) on Mon, Wed, Fridays Patient not taking: Reported on 12/28/2015 05/15/14   Hollice Espy, MD   BP 141/94 mmHg  Pulse 123  Temp(Src) 98.1 F (36.7 C) (Oral)  Resp 20  SpO2 95% Physical Exam  Constitutional: He is oriented to person, place, and time. He appears well-developed and well-nourished. No distress.  HENT:  Head: Normocephalic and atraumatic.  Mouth/Throat: Oropharynx is clear and moist.  Eyes: Conjunctivae and EOM are normal.  Neck: Normal range of motion.  Cardiovascular: Regular rhythm and normal heart sounds.  Tachycardia present.  Exam reveals decreased pulses.   No murmur heard. Pulses:      Dorsalis pedis pulses are 1+ on the right side, and 1+ on the left side.       Posterior tibial pulses are 1+ on the right side, and 1+ on the left side.  Pedal pulses difficult to palpate but are present b/l.  Pulmonary/Chest: Effort normal. No respiratory distress. He has no wheezes. He has no rales. He exhibits no tenderness.  Abdominal: Soft. Bowel sounds are normal. There is no tenderness. There is no rebound and no guarding.  protuberant  Musculoskeletal: Normal range of motion. Edema: +2 edema to knees bilaterally.  Neurological: He is alert and oriented to person, place, and time. Coordination normal.  Skin: Skin is warm and dry. He is not diaphoretic.  LE skin dry and flaky.  Psychiatric: His affect is inappropriate. His speech is tangential. He is agitated. Thought content is paranoid.   ED Course  Procedures (including critical care time) Labs Review Labs Reviewed  BASIC METABOLIC PANEL - Abnormal; Notable for the following:    Glucose, Bld 162 (*)    BUN 31 (*)    Creatinine, Ser 1.65 (*)    GFR calc non Af Amer 42 (*)    GFR  calc Af Amer 49 (*)    All other components within normal limits  CBC - Abnormal; Notable for the following:    Hemoglobin 12.5 (*)    All other components within normal limits  BRAIN NATRIURETIC PEPTIDE - Abnormal; Notable for the following:    B Natriuretic Peptide 886.8 (*)    All other components within normal limits  I-STAT TROPOININ, ED - Abnormal; Notable for the following:    Troponin i, poc 0.19 (*)    All other components within normal limits  ETHANOL   Imaging Review Dg Chest 2 View  12/28/2015  CLINICAL DATA:  Acute chest pain shortness of breath, diabetes, hypertension EXAM: CHEST  2 VIEW COMPARISON:  05/08/2014 FINDINGS: Stable cardiomegaly with slight  vascular congestion. Minor streaky bibasilar densities and trace pleural effusions. No definite focal pneumonia. No current CHF or pneumothorax. Trachea midline. Degenerative changes of the spine. IMPRESSION: cardiomegaly with trace pleural effusions and bibasilar atelectasis. Electronically Signed   By: Judie Petit.  Shick M.D.   On: 12/28/2015 12:45   I have personally reviewed and evaluated these images and lab results as part of my medical decision-making.   EKG Interpretation   Date/Time:  Saturday December 28 2015 12:05:19 EST Ventricular Rate:  123 PR Interval:  112 QRS Duration: 102 QT Interval:  344 QTC Calculation: 492 R Axis:   41 Text Interpretation:  Unusual P axis, possible ectopic atrial tachycardia  T wave abnormality, consider inferolateral ischemia Compared to previous  tracing T wave inversion more evident in Inferior leads Abnormal ECG  Confirmed by KNOTT MD, DANIEL ([id number]) on 12/28/2015 1:33:04 PM      MDM   Final diagnoses:  Atrial flutter, unspecified type (HCC)   1342: CXR with trace b/l pleural effusions.  iTrop 0.19.  sTrop has been negative in the past.  EKG with more concerning t wave abnormalities compared to last.  ASA 325mg  ordered. VSS.  Patient in NAD.  Cr also up 30% from baseline.  BUN  31.  1402: Given more pronounced t wave inversions and positive trop, Cardiology paged for consult.    1427: Discussed patient with Dr Elease Hashimoto.  He suspects likely CHF exacerbation and this is why trop is elevated.  However, EKG is different from previous, p waves difficult to discern.  Appears to be a-flutter.  Will ask Dr Antoine Poche come to assess.   1534: Placed consult for Unassigned admission.  [name] is a 65 y.o. male that presents with b/l LE edema and SOB.  He has a h/o HFrEF with an EF of 25-30% on echo in 2015.  Per patient not taking medications or seeing a cardiologist.  His thought process is somewhat paranoid and tangential.  He is AOx3 and has no focal neuro deficits.  Evidence of fluid overload on exam w/ bilateral LE edema. BNP here 886.  Has an AKI.  Cardiology to follow.  Asking for Medicine to admit, given concern for psychiatric issues and DM2.    1602: Patient signed out to Glenard Haring, MD, who will assume care for this patient.  Raliegh Ip, DO 12/28/15 7829  Lyndal Pulley, MD 12/29/15 [phone]

## 2015-12-28 NOTE — ED Notes (Signed)
Admitting at bedside 

## 2015-12-28 NOTE — ED Notes (Signed)
Attempted report 

## 2015-12-28 NOTE — ED Notes (Signed)
He c/o several week hx of increasing pain and swelling to BLE. He reports a hx of CHF and states he was hospitalized last summer for CHF exacerbation. He reports SOB with exertion. He denies CP. When asked about daily medications he would not answer whether he takes any. He is alert, breathing easily in triage.

## 2015-12-28 NOTE — ED Provider Notes (Signed)
  Physical Exam  BP 141/94 mmHg  Pulse 123  Temp(Src) 98.1 F (36.7 C) (Oral)  Resp 20  SpO2 95%  Physical Exam  ED Course  Procedures  MDM Patient taken over from Dr. Nadine Counts at 1600. Patient with CHF exacerbation along with type 2 diabetes and psychiatric issues. Will need admission to medicine.  4:49 PM: Discussed with [facility] who will admit patient.  Discussed case by pending Dr. Patria Mane.      Dan Humphreys, MD 12/28/15 1649  Azalia Bilis, MD 12/28/15 [phone]

## 2015-12-29 DIAGNOSIS — I483 Typical atrial flutter: Secondary | ICD-10-CM

## 2015-12-29 DIAGNOSIS — F329 Major depressive disorder, single episode, unspecified: Secondary | ICD-10-CM

## 2015-12-29 LAB — CBC
HCT: 40.2 % (ref 39.0–52.0)
Hemoglobin: 12.3 g/dL — ABNORMAL LOW (ref 13.0–17.0)
MCH: 27.4 pg (ref 26.0–34.0)
MCHC: 30.6 g/dL (ref 30.0–36.0)
MCV: 89.5 fL (ref 78.0–100.0)
Platelets: 178 10*3/uL (ref 150–400)
RBC: 4.49 MIL/uL (ref 4.22–5.81)
RDW: 14.8 % (ref 11.5–15.5)
WBC: 6.9 10*3/uL (ref 4.0–10.5)

## 2015-12-29 LAB — BASIC METABOLIC PANEL
ANION GAP: 13 (ref 5–15)
BUN: 32 mg/dL — AB (ref 6–20)
CALCIUM: 9.1 mg/dL (ref 8.9–10.3)
CO2: 26 mmol/L (ref 22–32)
CREATININE: 1.71 mg/dL — AB (ref 0.61–1.24)
Chloride: 104 mmol/L (ref 101–111)
GFR calc Af Amer: 47 mL/min — ABNORMAL LOW (ref >60)
GFR calc non Af Amer: 40 mL/min — ABNORMAL LOW (ref >60)
GLUCOSE: 87 mg/dL (ref 65–99)
Potassium: 4 mmol/L (ref 3.5–5.1)
Sodium: 143 mmol/L (ref 135–145)

## 2015-12-29 LAB — PROTIME-INR
INR: 1.45 (ref 0.00–1.49)
PROTHROMBIN TIME: 17.7 s — AB (ref 11.6–15.2)

## 2015-12-29 LAB — GLUCOSE, CAPILLARY
Glucose-Capillary: 130 mg/dL — ABNORMAL HIGH (ref 65–99)
Glucose-Capillary: 98 mg/dL (ref 65–99)
Glucose-Capillary: 90 mg/dL (ref 65–99)

## 2015-12-29 LAB — TROPONIN I: Troponin I: 0.48 ng/mL — ABNORMAL HIGH (ref <0.031)

## 2015-12-29 LAB — HEPARIN LEVEL (UNFRACTIONATED)
HEPARIN UNFRACTIONATED: 0.31 [IU]/mL (ref 0.30–0.70)
Heparin Unfractionated: 0.37 IU/mL (ref 0.30–0.70)

## 2015-12-29 NOTE — Progress Notes (Signed)
ANTICOAGULATION CONSULT NOTE - Follow Up Consult  Pharmacy Consult for Heparin  Indication: atrial fibrillation  No Known Allergies  Patient Measurements: Height: 5\' 8"  (172.7 cm) Weight: 230 lb 14.4 oz (104.736 kg) IBW/kg (Calculated) : 68.4  Vital Signs: Temp: 98.6 F (37 C) (02/18 1934) Temp Source: Oral (02/18 1934) BP: 136/98 mmHg (02/18 1934) Pulse Rate: 122 (02/18 1934)  Labs:  Recent Labs  12/28/15 1219 12/28/15 1947 12/29/15 0009  HGB 12.5*  --   --   HCT 40.1  --   --   PLT 183  --   --   HEPARINUNFRC  --   --  0.37  CREATININE 1.65*  --   --   TROPONINI  --  0.27*  --     Estimated Creatinine Clearance: 52.3 mL/min (by C-G formula based on Cr of 1.65).  Assessment: Heparin for afib, non-compliant with warfarin, initial heparin level is therapeutic at 0.37  Goal of Therapy:  Heparin level 0.3-0.7 units/ml Monitor platelets by anticoagulation protocol: Yes   Plan:  -Continue heparin at 1250 units/hr -Confirmatory HL with AM labs  Abran Duke 12/29/2015,1:23 AM

## 2015-12-29 NOTE — Progress Notes (Signed)
Utilization Review Completed.[name] T2/19/2017  

## 2015-12-29 NOTE — Progress Notes (Addendum)
ANTICOAGULATION CONSULT NOTE - Initial Consult  Pharmacy Consult for Heparin Indication: atrial fibrillation  No Known Allergies  Patient Measurements: Height: 5\' 8"  (172.7 cm) Weight: 226 lb 4.8 oz (102.649 kg) IBW/kg (Calculated) : 68.4 Heparin Dosing Weight: 91.5 kg  Vital Signs: Temp: 97.8 F (36.6 C) (02/19 0510) Temp Source: Oral (02/19 0510) BP: 157/94 mmHg (02/19 0510) Pulse Rate: 121 (02/19 0510)  Labs:  Recent Labs  12/28/15 1219 12/28/15 1947 12/29/15 0009 12/29/15 0430  HGB 12.5*  --   --  12.3*  HCT 40.1  --   --  40.2  PLT 183  --   --  178  LABPROT  --   --   --  17.7*  INR  --   --   --  1.45  HEPARINUNFRC  --   --  0.37 0.31  CREATININE 1.65*  --   --  1.71*  TROPONINI  --  0.27*  --  0.48*    Estimated Creatinine Clearance: 50 mL/min (by C-G formula based on Cr of 1.71).   Medical History: Past Medical History  Diagnosis Date  . A-fib (HCC)     a. (06/04/14) TEE-DC-CV; succesful; large LA 6.2 cm  . Depression   . Type 2 diabetes mellitus (HCC)   . CKD (chronic kidney disease) stage 3, GFR 30-59 ml/min   . OSA (obstructive sleep apnea)   . Chronic systolic heart failure (HCC)     a. ECHO (05/2014): EF 25-30%, diff HK, RV midly dilated and sys fx mildly/mod reduced  . HTN (hypertension)     Medications:  Prescriptions prior to admission  Medication Sig Dispense Refill Last Dose  . carvedilol (COREG) 12.5 MG tablet Take 1 tablet (12.5 mg total) by mouth 2 (two) times daily with a meal. (Patient not taking: Reported on 12/28/2015) 60 tablet 3 Not Taking at Unknown time  . digoxin (LANOXIN) 0.125 MG tablet Take 1 tablet (0.125 mg total) by mouth daily. (Patient not taking: Reported on 12/28/2015) 30 tablet 3 Not Taking at Unknown time  . furosemide (LASIX) 40 MG tablet Take 40 mg (1 tablet) in the morning and 20 mg (1/2 tablet) in the evening. (Patient not taking: Reported on 12/28/2015) 45 tablet 3 Not Taking at Unknown time  . hydrOXYzine  (ATARAX/VISTARIL) 25 MG tablet Take 1 tablet (25 mg total) by mouth every 8 (eight) hours as needed for itching. (Patient not taking: Reported on 12/28/2015) 30 tablet 0 Not Taking at Unknown time  . lisinopril (PRINIVIL,ZESTRIL) 2.5 MG tablet Take 1 tablet (2.5 mg total) by mouth 2 (two) times daily. (Patient not taking: Reported on 12/28/2015) 60 tablet 3 Not Taking at Unknown time  . metFORMIN (GLUCOPHAGE) 500 MG tablet Take 1 tablet (500 mg total) by mouth daily with breakfast. (Patient not taking: Reported on 12/28/2015) 60 tablet 0 Not Taking at Unknown time  . permethrin (ELIMITE) 5 % cream Apply from chin to toes, leave 8 hours, then shower. Repeat x 1 in one week. (Patient not taking: Reported on 12/28/2015) 60 g 1 Not Taking at Unknown time  . spironolactone (ALDACTONE) 25 MG tablet Take 0.5 tablets (12.5 mg total) by mouth daily. (Patient not taking: Reported on 12/28/2015) 45 tablet 3 Not Taking at Unknown time  . warfarin (COUMADIN) 5 MG tablet 5 mg (1 tab) on Sundays, Tues, Thursdays & Saturdays 7.5 mg (1 & half tabs) on Mon, Wed, Fridays (Patient not taking: Reported on 12/28/2015) 120 tablet 1 Not Taking at Unknown time  Scheduled:  . digoxin  0.125 mg Oral Daily  . furosemide  80 mg Intravenous BID  . insulin aspart  0-15 Units Subcutaneous TID WC  . insulin aspart  0-5 Units Subcutaneous QHS  . sodium chloride flush  3 mL Intravenous Q12H   Infusions:  . heparin 1,250 Units/hr (12/28/15 1804)    Assessment: 65yo male with history of Afib not taking his warfarin PTA presents with edema. Pharmacy is consulted to dose heparin for atrial fibrillation. Pt reports that he doesn't take any of his home meds including his coumadin.   HL currently therapeutic at 0.31, Hgb 12.3, Plts 178, no s/sx bleeding noted.    Goal of Therapy:  Heparin level 0.3-0.7 units/ml Monitor platelets by anticoagulation protocol: Yes   Plan:  -Increase heparin gtt to 1300 units/hr to keep in therapeutic  range -Daily CBC, HL -Monitor for s/sx of bleeding.   Hazle Nordmann, PharmD Pharmacy Resident [phone]  12/29/2015,8:39 AM

## 2015-12-29 NOTE — Progress Notes (Signed)
SUBJECTIVE:  He thinks that he might have slightly less edema.  However, he does not report much clinical improvement   PHYSICAL EXAM Filed Vitals:   12/28/15 1900 12/28/15 1934 12/29/15 0510 12/29/15 1054  BP:  136/98 157/94   Pulse: 122 122 121 122  Temp:  98.6 F (37 C) 97.8 F (36.6 C)   TempSrc:  Oral Oral   Resp: 20 20 20    Height:  5\' 8"  (1.727 m)    Weight:  230 lb 14.4 oz (104.736 kg) 226 lb 4.8 oz (102.649 kg)   SpO2: 99% 100% 98%    General:  No acute distress Lungs:  Clear Heart:  Irregular Abdomen:  Positive bowel sounds, no rebound no guarding Extremities:  Moderate edema  LABS: Lab Results  Component Value Date   TROPONINI 0.48* 12/29/2015   Results for orders placed or performed during the hospital encounter of 12/28/15 (from the past 24 hour(s))  Basic metabolic panel     Status: Abnormal   Collection Time: 12/28/15 12:19 PM  Result Value Ref Range   Sodium 139 135 - 145 mmol/L   Potassium 4.2 3.5 - 5.1 mmol/L   Chloride 103 101 - 111 mmol/L   CO2 24 22 - 32 mmol/L   Glucose, Bld 162 (H) 65 - 99 mg/dL   BUN 31 (H) 6 - 20 mg/dL   Creatinine, Ser 9.23 (H) 0.61 - 1.24 mg/dL   Calcium 9.1 8.9 - 30.0 mg/dL   GFR calc non Af Amer 42 (L) >60 mL/min   GFR calc Af Amer 49 (L) >60 mL/min   Anion gap 12 5 - 15  CBC     Status: Abnormal   Collection Time: 12/28/15 12:19 PM  Result Value Ref Range   WBC 7.3 4.0 - 10.5 K/uL   RBC 4.47 4.22 - 5.81 MIL/uL   Hemoglobin 12.5 (L) 13.0 - 17.0 g/dL   HCT 76.2 26.3 - 33.5 %   MCV 89.7 78.0 - 100.0 fL   MCH 28.0 26.0 - 34.0 pg   MCHC 31.2 30.0 - 36.0 g/dL   RDW 45.6 25.6 - 38.9 %   Platelets 183 150 - 400 K/uL  I-stat troponin, ED (not at [facility], [facility])     Status: Abnormal   Collection Time: 12/28/15 12:34 PM  Result Value Ref Range   Troponin i, poc 0.19 (HH) 0.00 - 0.08 ng/mL   Comment NOTIFIED PHYSICIAN    Comment 3          Ethanol     Status: None   Collection Time: 12/28/15  2:32 PM  Result Value Ref  Range   Alcohol, Ethyl (B) <5 <5 mg/dL  Brain natriuretic peptide     Status: Abnormal   Collection Time: 12/28/15  2:49 PM  Result Value Ref Range   B Natriuretic Peptide 886.8 (H) 0.0 - 100.0 pg/mL  Troponin I     Status: Abnormal   Collection Time: 12/28/15  7:47 PM  Result Value Ref Range   Troponin I 0.27 (H) <0.031 ng/mL  Glucose, capillary     Status: Abnormal   Collection Time: 12/28/15 10:39 PM  Result Value Ref Range   Glucose-Capillary 131 (H) 65 - 99 mg/dL  Heparin level (unfractionated)     Status: None   Collection Time: 12/29/15 12:09 AM  Result Value Ref Range   Heparin Unfractionated 0.37 0.30 - 0.70 IU/mL  Basic metabolic panel     Status: Abnormal   Collection Time:  12/29/15  4:30 AM  Result Value Ref Range   Sodium 143 135 - 145 mmol/L   Potassium 4.0 3.5 - 5.1 mmol/L   Chloride 104 101 - 111 mmol/L   CO2 26 22 - 32 mmol/L   Glucose, Bld 87 65 - 99 mg/dL   BUN 32 (H) 6 - 20 mg/dL   Creatinine, Ser 1.61 (H) 0.61 - 1.24 mg/dL   Calcium 9.1 8.9 - 09.6 mg/dL   GFR calc non Af Amer 40 (L) >60 mL/min   GFR calc Af Amer 47 (L) >60 mL/min   Anion gap 13 5 - 15  Troponin I     Status: Abnormal   Collection Time: 12/29/15  4:30 AM  Result Value Ref Range   Troponin I 0.48 (H) <0.031 ng/mL  CBC     Status: Abnormal   Collection Time: 12/29/15  4:30 AM  Result Value Ref Range   WBC 6.9 4.0 - 10.5 K/uL   RBC 4.49 4.22 - 5.81 MIL/uL   Hemoglobin 12.3 (L) 13.0 - 17.0 g/dL   HCT 04.5 40.9 - 81.1 %   MCV 89.5 78.0 - 100.0 fL   MCH 27.4 26.0 - 34.0 pg   MCHC 30.6 30.0 - 36.0 g/dL   RDW 91.4 78.2 - 95.6 %   Platelets 178 150 - 400 K/uL  Heparin level (unfractionated)     Status: None   Collection Time: 12/29/15  4:30 AM  Result Value Ref Range   Heparin Unfractionated 0.31 0.30 - 0.70 IU/mL  Protime-INR     Status: Abnormal   Collection Time: 12/29/15  4:30 AM  Result Value Ref Range   Prothrombin Time 17.7 (H) 11.6 - 15.2 seconds   INR 1.45 0.00 - 1.49     Intake/Output Summary (Last 24 hours) at 12/29/15 1140 Last data filed at 12/29/15 1026  Gross per 24 hour  Intake    360 ml  Output   1550 ml  Net  -1190 ml     ASSESSMENT AND PLAN:  ACUTE ON CHRONIC SYSTOLIC CHF: EF 21% in 2015. Good UO since admission.  Echo is pending.   HTN: This is being managed in the context of treating his CHF.  Creat is elevated but I think he is tolerating his current diuretic.   ATRIAL FIB: He had a TEE DCCV in July 2015. Now in atrial flutter with 2:1 conduction. He should ultimately have DCCV again but only if he can be compliant with anticoagulation  OSA: Needs formal diagnosis and treatment.   CKD: Follow creat closely as above  DM: Per primary team  ELEVATED TROPONIN: Doubt acute ischemic syndrome. He will remain on heparin until we decide about chronic anticoagulation.   SOCIAL/PSYCH: Per hospital team.  His chronic management will not be successful without these issues being addressed.  He needs a psychiatry consult.    Fayrene Fearing [facility] 12/29/2015 11:40 AM

## 2015-12-29 NOTE — Progress Notes (Signed)
[facility]                                                                              Patient Demographics  [name], is a 65 y.o. male, DOB - 01-Sep-1951, ZOX:[id number]  Admit date - 12/28/2015   Admitting Physician Calvert Cantor, MD  Outpatient Primary MD for the patient is [name], MD  LOS - 1   Chief Complaint  Patient presents with  . Leg Swelling       Brief HPI   [name] is a 64 y.o. male with a past medical history that includes type 2 diabetes uncontrolled, chronic kidney disease stage III, obesity, lower extremity edema, hypertension, chronic systolic heart failure, obstructive sleep apnea A. fib noncompliant with Coumadin presents to the emergency department with chief complaint of worsening lower extremity edema. Initial evaluation reveals acute on chronic systolic heart failure with an elevated troponin. Information is obtained from the patient. He reports noncompliance with medication and follow-up medical care since 2015. He states he came to the emergency department when his legs swelled to the point that it caused him pain and made it difficult to walk. He denies any chest pain palpitations headache dizziness syncope or near-syncope. He denies orthopnea cough nausea vomiting. He denies dysuria hematuria frequency or urgency. He denies any recent fever chills or sick contacts Emergency department he's afebrile hemodynamically stable and not hypoxic   Assessment & Plan    Principal Problem: Acute on chronic systolic heart failure.  - Echo in 2015 with an EF of 25%. Current BNP elevated. Chest x-ray with slight vascular congestion. Has been noncompliant with medications for almost 2 years.  - Continue aggressive IV diuresis - Negative balance of 1.49 L, which down from 232 -> 226lbs  - Cardiology following, obtain 2-D echo  Active problems Hypertension.  Home medications include Coreg Lasix lisinopril, not taking these  medications for over a year - Continue Lasix  Atrial fibrillation: With RVR - TEE DCCV July 2015, has been on Coumadin in the past but now noncompliant.  - Started on oral digoxin - Heparin gtt per cardiology, heart rate not well controlled - Per cardiology, now in 2:1 atrial flutter, will need DCCV   Elevated troponin. - Per cardiology, doubt acute ischemic syndrome, continue heparin drip until anticoagulation is decided - Follow 2-D echo   Acute on Chronic kidney disease. Stage III. Creatinine 1.6 on admission. This is slightly above baseline. -Creatinine trending up, monitor closely with diuresis   Depression/ psychosocial issues/ severe noncompliance - He does verbalize some hopelessness. Denies suicidal ideation. Has severe noncompliance, stating that FDA has changed the food pyramid and fats, dairy is better and we are giving him wrong food. Not sure how much is this psych versus his baseline personality, rude in his behavior during the encounter.  - called psych consult   Diabetes. On oral agents in the past. Serum glucose 162 on admission -Hold metformin -Obtain hemoglobin A1c -Sliding scale  Code Status: Full CODE STATUS  Family Communication: Discussed in detail with the patient, all imaging results, lab results explained to the patient    Disposition  Plan:   Time Spent in minutes  25 minutes  Procedures    Consults   Cardiology  DVT Prophylaxis  heparin   Medications  Scheduled Meds: . digoxin  0.125 mg Oral Daily  . furosemide  80 mg Intravenous BID  . insulin aspart  0-15 Units Subcutaneous TID WC  . insulin aspart  0-5 Units Subcutaneous QHS  . sodium chloride flush  3 mL Intravenous Q12H   Continuous Infusions: . heparin 1,300 Units/hr (12/29/15 1054)   PRN Meds:.sodium chloride, acetaminophen, hydrOXYzine, ondansetron (ZOFRAN) IV, sodium chloride flush   Antibiotics   Anti-infectives    None        Subjective:   [name] was  seen and examined today.  Heart rate uncontrolled, denies any chest pain. Patient does not give any straight answer to the review of systems, talks tangentially. No acute events overnight.    Objective:   Filed Vitals:   12/28/15 1934 12/29/15 0510 12/29/15 1054 12/29/15 1258  BP: 136/98 157/94  142/103  Pulse: 122 121 122 120  Temp: 98.6 F (37 C) 97.8 F (36.6 C)  97.9 F (36.6 C)  TempSrc: Oral Oral  Oral  Resp: 20 20  20   Height: 5\' 8"  (1.727 m)     Weight: 104.736 kg (230 lb 14.4 oz) 102.649 kg (226 lb 4.8 oz)    SpO2: 100% 98%  97%    Intake/Output Summary (Last 24 hours) at 12/29/15 1321 Last data filed at 12/29/15 1200  Gross per 24 hour  Intake    360 ml  Output   1850 ml  Net  -1490 ml     Wt Readings from Last 3 Encounters:  12/29/15 102.649 kg (226 lb 4.8 oz)  06/19/14 96.333 kg (212 lb 6 oz)  05/21/14 103.874 kg (229 lb)     Exam  General: Alert and oriented x 3, NAD  HEENT:  PERRLA, EOMI  Neck: Supple, +JVD, no masses  CVS: irregularly irregular, tachycardia  Respiratory: Clear to auscultation bilaterally, no wheezing, rales or rhonchi  Abdomen: Soft, nontender, nondistended, + bowel sounds  Ext: no cyanosis clubbing, 1+ edema  Neuro: AAOx3, Cr N's II- XII. Strength 5/5 upper and lower extremities bilaterally  Skin: No rashes  Psych: Normal affect and demeanor, alert and oriented x3    Data Review   Micro Results No results found for this or any previous visit (from the past 240 hour(s)).  Radiology Reports Dg Chest 2 View  12/28/2015  CLINICAL DATA:  Acute chest pain shortness of breath, diabetes, hypertension EXAM: CHEST  2 VIEW COMPARISON:  05/08/2014 FINDINGS: Stable cardiomegaly with slight vascular congestion. Minor streaky bibasilar densities and trace pleural effusions. No definite focal pneumonia. No current CHF or pneumothorax. Trachea midline. Degenerative changes of the spine. IMPRESSION: cardiomegaly with trace pleural  effusions and bibasilar atelectasis. Electronically Signed   By: Judie Petit.  Shick M.D.   On: 12/28/2015 12:45    CBC  Recent Labs Lab 12/28/15 1219 12/29/15 0430  WBC 7.3 6.9  HGB 12.5* 12.3*  HCT 40.1 40.2  PLT 183 178  MCV 89.7 89.5  MCH 28.0 27.4  MCHC 31.2 30.6  RDW 14.9 14.8    Chemistries   Recent Labs Lab 12/28/15 1219 12/29/15 0430  NA 139 143  K 4.2 4.0  CL 103 104  CO2 24 26  GLUCOSE 162* 87  BUN 31* 32*  CREATININE 1.65* 1.71*  CALCIUM 9.1 9.1   ------------------------------------------------------------------------------------------------------------------ estimated creatinine clearance is 50 mL/min (by  C-G formula based on Cr of 1.71). ------------------------------------------------------------------------------------------------------------------ No results for input(s): HGBA1C in the last 72 hours. ------------------------------------------------------------------------------------------------------------------ No results for input(s): CHOL, HDL, LDLCALC, TRIG, CHOLHDL, LDLDIRECT in the last 72 hours. ------------------------------------------------------------------------------------------------------------------ No results for input(s): TSH, T4TOTAL, T3FREE, THYROIDAB in the last 72 hours.  Invalid input(s): FREET3 ------------------------------------------------------------------------------------------------------------------ No results for input(s): VITAMINB12, FOLATE, FERRITIN, TIBC, IRON, RETICCTPCT in the last 72 hours.  Coagulation profile  Recent Labs Lab 12/29/15 0430  INR 1.45    No results for input(s): DDIMER in the last 72 hours.  Cardiac Enzymes  Recent Labs Lab 12/28/15 1947 12/29/15 0430  TROPONINI 0.27* 0.48*   ------------------------------------------------------------------------------------------------------------------ Invalid input(s): POCBNP   Recent Labs  12/28/15 2239 12/29/15 1133  GLUCAP 131* 130*      Kimberley Dastrup M.D. [facility] 12/29/2015, 1:21 PM  Pager: [phone] Between 7am to 7pm - call Pager - [phone]  After 7pm go to [email or web address] - password [email or web address]  Call night coverage person covering after 7pm

## 2015-12-30 ENCOUNTER — Inpatient Hospital Stay (HOSPITAL_COMMUNITY): Payer: Medicare Other

## 2015-12-30 DIAGNOSIS — N179 Acute kidney failure, unspecified: Secondary | ICD-10-CM

## 2015-12-30 DIAGNOSIS — E669 Obesity, unspecified: Secondary | ICD-10-CM

## 2015-12-30 DIAGNOSIS — R7989 Other specified abnormal findings of blood chemistry: Secondary | ICD-10-CM

## 2015-12-30 DIAGNOSIS — R778 Other specified abnormalities of plasma proteins: Secondary | ICD-10-CM | POA: Insufficient documentation

## 2015-12-30 DIAGNOSIS — M7989 Other specified soft tissue disorders: Secondary | ICD-10-CM

## 2015-12-30 DIAGNOSIS — I4892 Unspecified atrial flutter: Secondary | ICD-10-CM

## 2015-12-30 DIAGNOSIS — I509 Heart failure, unspecified: Secondary | ICD-10-CM

## 2015-12-30 DIAGNOSIS — F333 Major depressive disorder, recurrent, severe with psychotic symptoms: Secondary | ICD-10-CM

## 2015-12-30 LAB — GLUCOSE, CAPILLARY
Glucose-Capillary: 83 mg/dL (ref 65–99)
Glucose-Capillary: 111 mg/dL — ABNORMAL HIGH (ref 65–99)
Glucose-Capillary: 143 mg/dL — ABNORMAL HIGH (ref 65–99)
Glucose-Capillary: 110 mg/dL — ABNORMAL HIGH (ref 65–99)

## 2015-12-30 LAB — CBC
HCT: 37.1 % — ABNORMAL LOW (ref 39.0–52.0)
Hemoglobin: 11.9 g/dL — ABNORMAL LOW (ref 13.0–17.0)
MCH: 28.8 pg (ref 26.0–34.0)
MCHC: 32.1 g/dL (ref 30.0–36.0)
MCV: 89.8 fL (ref 78.0–100.0)
PLATELETS: 124 10*3/uL — AB (ref 150–400)
RBC: 4.13 MIL/uL — ABNORMAL LOW (ref 4.22–5.81)
RDW: 15.1 % (ref 11.5–15.5)
WBC: 5.3 10*3/uL (ref 4.0–10.5)

## 2015-12-30 LAB — HEPARIN LEVEL (UNFRACTIONATED): Heparin Unfractionated: 0.38 [IU]/mL (ref 0.30–0.70)

## 2015-12-30 LAB — HEMOGLOBIN A1C
Hgb A1c MFr Bld: 6 % — ABNORMAL HIGH (ref 4.8–5.6)
Mean Plasma Glucose: 126 mg/dL

## 2015-12-30 LAB — BASIC METABOLIC PANEL
Anion gap: 10 (ref 5–15)
BUN: 28 mg/dL — AB (ref 6–20)
CALCIUM: 8.7 mg/dL — AB (ref 8.9–10.3)
CHLORIDE: 102 mmol/L (ref 101–111)
CO2: 28 mmol/L (ref 22–32)
CREATININE: 1.62 mg/dL — AB (ref 0.61–1.24)
GFR calc Af Amer: 50 mL/min — ABNORMAL LOW (ref >60)
GFR, EST NON AFRICAN AMERICAN: 43 mL/min — AB (ref >60)
Glucose, Bld: 132 mg/dL — ABNORMAL HIGH (ref 65–99)
Potassium: 3 mmol/L — ABNORMAL LOW (ref 3.5–5.1)
SODIUM: 140 mmol/L (ref 135–145)

## 2015-12-30 LAB — TROPONIN I: Troponin I: 0.31 ng/mL — ABNORMAL HIGH (ref <0.031)

## 2015-12-30 MED ORDER — ESCITALOPRAM OXALATE 10 MG PO TABS
10.0000 mg | ORAL_TABLET | Freq: Every day | ORAL | Status: DC
  Administered 2015-12-31 – 2016-01-05 (×3): 10 mg via ORAL
  Filled 2015-12-30 (×12): qty 1

## 2015-12-30 MED ORDER — METOPROLOL SUCCINATE ER 25 MG PO TB24
25.0000 mg | ORAL_TABLET | Freq: Every day | ORAL | Status: DC
  Administered 2015-12-30 – 2015-12-31 (×2): 25 mg via ORAL
  Filled 2015-12-30 (×2): qty 1

## 2015-12-30 MED ORDER — POTASSIUM CHLORIDE CRYS ER 20 MEQ PO TBCR
40.0000 meq | EXTENDED_RELEASE_TABLET | Freq: Every day | ORAL | Status: DC
  Administered 2015-12-30 – 2016-01-10 (×12): 40 meq via ORAL
  Filled 2015-12-30 (×12): qty 2

## 2015-12-30 MED ORDER — ARIPIPRAZOLE 2 MG PO TABS
2.0000 mg | ORAL_TABLET | Freq: Two times a day (BID) | ORAL | Status: DC
  Administered 2015-12-31 – 2016-01-08 (×3): 2 mg via ORAL
  Filled 2015-12-30 (×23): qty 1

## 2015-12-30 NOTE — Progress Notes (Signed)
  Echocardiogram 2D Echocardiogram has been performed.  Delcie Roch 12/30/2015, 4:20 PM

## 2015-12-30 NOTE — Progress Notes (Signed)
[facility]                                                                              Patient Demographics  [name], is a 65 y.o. male, DOB - 1951-03-07, GNF:[id number]  Admit date - 12/28/2015   Admitting Physician Calvert Cantor, MD  Outpatient Primary MD for the patient is [name], MD  LOS - 2   Chief Complaint  Patient presents with  . Leg Swelling       Brief HPI   [name] is a 65 y.o. male with a past medical history that includes type 2 diabetes uncontrolled, chronic kidney disease stage III, obesity, lower extremity edema, hypertension, chronic systolic heart failure, obstructive sleep apnea A. fib noncompliant with Coumadin presents to the emergency department with chief complaint of worsening lower extremity edema. Initial evaluation reveals acute on chronic systolic heart failure with an elevated troponin. Information is obtained from the patient. He reports noncompliance with medication and follow-up medical care since 2015. He states he came to the emergency department when his legs swelled to the point that it caused him pain and made it difficult to walk. He denies any chest pain palpitations headache dizziness syncope or near-syncope. He denies orthopnea cough nausea vomiting. He denies dysuria hematuria frequency or urgency. He denies any recent fever chills or sick contacts Emergency department he's afebrile hemodynamically stable and not hypoxic   Assessment & Plan    Principal Problem: Acute on chronic systolic heart failure.  - Echo in 2015 with an EF of 25%. Current BNP elevated. Chest x-ray with slight vascular congestion. Has been noncompliant with medications for almost 2 years.  - Continue aggressive IV diuresis - Negative balance of 7.1 L, which down from 232 -> 215 lbs  - Cardiology following 2-D echo pending  Active problems Hypertension.  Home medications include Coreg Lasix lisinopril, not taking these  medications for over a year - Continue Lasix  Atrial fibrillation: With RVR - TEE DCCV July 2015, has been on Coumadin in the past but now noncompliant.  - Started on oral digoxin, rate not controlled - Heparin gtt per cardiology, heart rate not well controlled - Per cardiology, now in 2:1 atrial flutter, will need DCCV   Elevated troponin. - Per cardiology, doubt acute ischemic syndrome, continue heparin drip until anticoagulation is decided - Follow 2-D echo - will need ischemia workup, although currently no chest pain   Acute on Chronic kidney disease. Stage III. Creatinine 1.6 on admission. This is slightly above baseline. -Creatinine stable, improving   Depression/ psychosocial issues/ severe noncompliance - severe noncompliance, stating that FDA has changed the food pyramid and fats, dairy is better and we are giving him wrong food. Not sure how much is this psych versus his baseline personality. -I have called psych consult , recommendations pending  Diabetes. On oral agents in the past. Serum glucose 162 on admission -Hold metformin, hemoglobin A1c 6.0 -Sliding scale  Code Status: Full CODE STATUS  Family Communication: Discussed in detail with the patient, all imaging results, lab results explained to the patient    Disposition Plan:   Time  Spent in minutes  15 minutes  Procedures    Consults   Cardiology  DVT Prophylaxis  heparin   Medications  Scheduled Meds: . digoxin  0.125 mg Oral Daily  . furosemide  80 mg Intravenous BID  . insulin aspart  0-15 Units Subcutaneous TID WC  . insulin aspart  0-5 Units Subcutaneous QHS  . metoprolol succinate  25 mg Oral Daily  . sodium chloride flush  3 mL Intravenous Q12H   Continuous Infusions: . heparin 1,300 Units/hr (12/30/15 0508)   PRN Meds:.sodium chloride, acetaminophen, hydrOXYzine, ondansetron (ZOFRAN) IV, sodium chloride flush   Antibiotics   Anti-infectives    None        Subjective:    [name] was seen and examined today.  Heart rate still not well controlled, denies any chest pain or shortness of breath. No nausea, vomiting, abdominal pain. No acute events overnight. Patient much more calm and cooperative today    Objective:   Filed Vitals:   12/29/15 1258 12/29/15 2337 12/30/15 0458 12/30/15 0800  BP: 142/103 154/91 134/93 140/99  Pulse: 120 122 119 118  Temp: 97.9 F (36.6 C) 98.2 F (36.8 C) 98 F (36.7 C) 97.8 F (36.6 C)  TempSrc: Oral Oral Oral Oral  Resp: 20 18 17    Height:      Weight:   97.796 kg (215 lb 9.6 oz)   SpO2: 97% 98% 97% 98%    Intake/Output Summary (Last 24 hours) at 12/30/15 1227 Last data filed at 12/30/15 1133  Gross per 24 hour  Intake   1116 ml  Output   6750 ml  Net  -5634 ml     Wt Readings from Last 3 Encounters:  12/30/15 97.796 kg (215 lb 9.6 oz)  06/19/14 96.333 kg (212 lb 6 oz)  05/21/14 103.874 kg (229 lb)     Exam  General: Alert and oriented x 3, NAD  HEENT:  PERRLA, EOMI  Neck: Supple, +JVD, no masses  CVS: irregularly irregular, tachycardia  Respiratory: Decreased breath sound at the bases  Abdomen: Soft, nontender, nondistended, + bowel sounds  Ext: no cyanosis clubbing, 1+ edema  Neuro: no new deficits  Skin: No rashes  Psych: Normal affect and demeanor, alert and oriented x3    Data Review   Micro Results No results found for this or any previous visit (from the past 240 hour(s)).  Radiology Reports Dg Chest 2 View  12/28/2015  CLINICAL DATA:  Acute chest pain shortness of breath, diabetes, hypertension EXAM: CHEST  2 VIEW COMPARISON:  05/08/2014 FINDINGS: Stable cardiomegaly with slight vascular congestion. Minor streaky bibasilar densities and trace pleural effusions. No definite focal pneumonia. No current CHF or pneumothorax. Trachea midline. Degenerative changes of the spine. IMPRESSION: cardiomegaly with trace pleural effusions and bibasilar atelectasis. Electronically  Signed   By: Judie Petit.  Shick M.D.   On: 12/28/2015 12:45    CBC  Recent Labs Lab 12/28/15 1219 12/29/15 0430 12/30/15 0635  WBC 7.3 6.9 5.3  HGB 12.5* 12.3* 11.9*  HCT 40.1 40.2 37.1*  PLT 183 178 124*  MCV 89.7 89.5 89.8  MCH 28.0 27.4 28.8  MCHC 31.2 30.6 32.1  RDW 14.9 14.8 15.1    Chemistries   Recent Labs Lab 12/28/15 1219 12/29/15 0430 12/30/15 0635  NA 139 143 140  K 4.2 4.0 3.0*  CL 103 104 102  CO2 24 26 28   GLUCOSE 162* 87 132*  BUN 31* 32* 28*  CREATININE 1.65* 1.71* 1.62*  CALCIUM 9.1  9.1 8.7*   ------------------------------------------------------------------------------------------------------------------ estimated creatinine clearance is 51.6 mL/min (by C-G formula based on Cr of 1.62). ------------------------------------------------------------------------------------------------------------------  Recent Labs  12/28/15 1947  HGBA1C 6.0*   ------------------------------------------------------------------------------------------------------------------ No results for input(s): CHOL, HDL, LDLCALC, TRIG, CHOLHDL, LDLDIRECT in the last 72 hours. ------------------------------------------------------------------------------------------------------------------ No results for input(s): TSH, T4TOTAL, T3FREE, THYROIDAB in the last 72 hours.  Invalid input(s): FREET3 ------------------------------------------------------------------------------------------------------------------ No results for input(s): VITAMINB12, FOLATE, FERRITIN, TIBC, IRON, RETICCTPCT in the last 72 hours.  Coagulation profile  Recent Labs Lab 12/29/15 0430  INR 1.45    No results for input(s): DDIMER in the last 72 hours.  Cardiac Enzymes  Recent Labs Lab 12/28/15 1947 12/29/15 0430  TROPONINI 0.27* 0.48*   ------------------------------------------------------------------------------------------------------------------ Invalid input(s): POCBNP   Recent Labs   12/28/15 2239 12/29/15 1133 12/29/15 1627 12/29/15 2217 12/30/15 0512 12/30/15 1132  GLUCAP 131* 130* 98 90 83 111*     [name].D. [facility] 12/30/2015, 12:27 PM  Pager: [phone] Between 7am to 7pm - call Pager - [phone]  After 7pm go to [email or web address] - password [email or web address]  Call night coverage person covering after 7pm

## 2015-12-30 NOTE — Progress Notes (Addendum)
Patient Name: [name] Date of Encounter: 12/30/2015  Primary Cardiologist Dr. Gala Romney   Principal Problem:   Acute on chronic systolic heart failure [facility]) Active Problems:   Swelling of lower extremity   Depression   Type 2 diabetes, uncontrolled, with renal manifestation (HCC)   CKD (chronic kidney disease), stage II   Obesity (BMI 30-39.9)   HTN (hypertension)   Atrial flutter (HCC)    SUBJECTIVE  Denies any CP. SOB improved.   CURRENT MEDS . digoxin  0.125 mg Oral Daily  . furosemide  80 mg Intravenous BID  . insulin aspart  0-15 Units Subcutaneous TID WC  . insulin aspart  0-5 Units Subcutaneous QHS  . sodium chloride flush  3 mL Intravenous Q12H    OBJECTIVE  Filed Vitals:   12/29/15 1258 12/29/15 2337 12/30/15 0458 12/30/15 0800  BP: 142/103 154/91 134/93 140/99  Pulse: 120 122 119 118  Temp: 97.9 F (36.6 C) 98.2 F (36.8 C) 98 F (36.7 C) 97.8 F (36.6 C)  TempSrc: Oral Oral Oral Oral  Resp: 20 18 17    Height:      Weight:   215 lb 9.6 oz (97.796 kg)   SpO2: 97% 98% 97% 98%    Intake/Output Summary (Last 24 hours) at 12/30/15 1201 Last data filed at 12/30/15 1133  Gross per 24 hour  Intake   1116 ml  Output   6750 ml  Net  -5634 ml   Filed Weights   12/28/15 1934 12/29/15 0510 12/30/15 0458  Weight: 230 lb 14.4 oz (104.736 kg) 226 lb 4.8 oz (102.649 kg) 215 lb 9.6 oz (97.796 kg)    PHYSICAL EXAM  General: Pleasant, NAD. Neuro: Alert and oriented X 3. Moves all extremities spontaneously. Psych: Normal affect. HEENT:  Normal  Neck: Supple without bruits or JVD. Lungs:  Resp regular and unlabored. Diminished breath sound in bilateral bases with R basilar rale Heart: RRR no s3, s4. Mitral click Abdomen: Soft, non-tender, non-distended, BS + x 4.  Extremities: No clubbing, cyanosis. DP/PT/Radials 2+ and equal bilaterally. 1+ pitting edema in bilateral LE  Accessory Clinical Findings  CBC  Recent Labs  12/29/15 0430  12/30/15 0635  WBC 6.9 5.3  HGB 12.3* 11.9*  HCT 40.2 37.1*  MCV 89.5 89.8  PLT 178 124*   Basic Metabolic Panel  Recent Labs  12/29/15 0430 12/30/15 0635  NA 143 140  K 4.0 3.0*  CL 104 102  CO2 26 28  GLUCOSE 87 132*  BUN 32* 28*  CREATININE 1.71* 1.62*  CALCIUM 9.1 8.7*   Cardiac Enzymes  Recent Labs  12/28/15 1947 12/29/15 0430  TROPONINI 0.27* 0.48*   Hemoglobin A1C  Recent Labs  12/28/15 1947  HGBA1C 6.0*    TELE Aflutter with RVR HR 120s    ECG  No new EKG  Echocardiogram 05/09/2014  LV EF: 25% -  30%  ------------------------------------------------------------------- Indications:   Dyspnea 786.09.  ------------------------------------------------------------------- History:  PMH: Acute kidney injury. Swelling of lower extremity. Atrial fibrillation.  ------------------------------------------------------------------- Study Conclusions  - Left ventricle: The cavity size was at the upper limits of normal. Wall thickness was increased in a pattern of mild to moderate LVH. Systolic function was severely reduced. The estimated ejection fraction was in the range of 25% to 30%. Diffuse hypokinesis. The study is not technically sufficient to allow evaluation of LV diastolic function - rhythm appears to be atrial fibrillation. - Mitral valve: Mildly calcified annulus. There was trivial regurgitation. - Left atrium:  The atrium was severely dilated. - Right ventricle: The cavity size was mildly dilated. Systolic function was mildly to moderately reduced. - Right atrium: The atrium was moderately dilated. Central venous pressure (est): 15 mm Hg. - Atrial septum: No defect or patent foramen ovale was identified. - Tricuspid valve: There was mild regurgitation. - Pulmonary arteries: PA peak pressure: 48 mm Hg (S). - Pericardium, extracardiac: A trivial pericardial effusion was identified posterior to the  heart.  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 Indeterminate diastolic function. Severe left atrial enlargement. MIld RV enlargement with reduced contraction. Mild tricuspid regurgitation with PASP 48 mmHg and elevated CVP. Trivial pericardial effusion.    Radiology/Studies  Dg Chest 2 View  12/28/2015  CLINICAL DATA:  Acute chest pain shortness of breath, diabetes, hypertension EXAM: CHEST  2 VIEW COMPARISON:  05/08/2014 FINDINGS: Stable cardiomegaly with slight vascular congestion. Minor streaky bibasilar densities and trace pleural effusions. No definite focal pneumonia. No current CHF or pneumothorax. Trachea midline. Degenerative changes of the spine. IMPRESSION: cardiomegaly with trace pleural effusions and bibasilar atelectasis. Electronically Signed   By: Judie Petit.  Shick M.D.   On: 12/28/2015 12:45    ASSESSMENT AND PLAN  1. Acute on chronic systolic HF  - pending echocardiogram (note mitral click, ?prolapse). Echo 05/09/2014 EF 25-30%, moderate LVH, peak PA pressure  - close to euvolemic at this time, still has R basilar rale and trace pitting edema in LE, will continue IV lasix, likely transition to PO lasix tomorrow.   - I/O -6L. Weight down from 232 --> 215 lbs.   2. aflutter with RVR  - CHA2DS2-Vasc score 4 (HTN, DM, age, HF)  - INR subtherapeutic due to noncompliance. Current on IV heparin given elevated trop, will need to restart coumadin at some point.  - off all rate control medication except digoxin, HR persistently in 120s, since he is near euvolemic level, will start BB, add toprol XL  daily, uptitrate as tolerated.  - plan outpatient DCCV if he can be compliant on rate control med and coumadin   3. Elevated trop  - likely due to volume overload and renal issue  - Trop 0.27 --> 0.48. Will discuss with MD, may need myoview or L&RHC  at some point, given lack of chest pain, may prefer myoview as his Cr is 1.7. If ischemic workup negative, would restart coumadin.  4. Acute on chronic renal insufficiency  - baseline Cr 1.1 in 05/2014, now Cr 1.6-1.7, unclear how much of this is acute on chronic. Likely related to noncompliance with medication   5. OSA 6. CKD 7. DM 8. H/o afib 9. Psychosocial issue: Per hospital team. His chronic management will not be successful without these issues being addressed. He needs a psychiatry consult.IM called psych consult   Signed, Amedeo Plenty Pager: [phone]   The patient was seen, examined and discussed with Azalee Course, PA-C and I agree with the above.   Continue another day of iv lasix, we will switch to PO tomorrow. Almost euvolemic. Continue iv heparin, start loading with coumadin once the stress test negative, we will schedule a myoview once troponin is downtrending.  Lars Masson 12/30/2015

## 2015-12-30 NOTE — Consult Note (Signed)
[facility] [facility] Psychiatry Consult   Reason for Consult:  Depression Referring Physician:  Dr. Tana Coast Patient Identification: [name] MRN:  [id number] Principal Diagnosis: Acute on chronic systolic heart failure [facility]) Diagnosis:   Patient Active Problem List   Diagnosis Date Noted  . Acute on chronic heart failure ([place]) [I50.9]   . Acute on chronic systolic heart failure ([place]) [I50.23] 12/28/2015  . Atrial flutter ([place]) [I48.92] 12/28/2015  . Type 2 diabetes mellitus ([place]) [E11.9]   . Encounter for therapeutic drug monitoring [Z51.81] 05/24/2014  . HTN (hypertension) [I10] 05/21/2014  . Chronic systolic heart failure ([place]) [I50.22] 05/15/2014  . Obstructive sleep apnea [G47.33] 05/13/2014  . Acute venous stasis dermatitis of both legs [I83.11, I83.12] 05/13/2014  . Dyspnea [R06.00] 05/09/2014  . Atrial fibrillation with RVR ([place]) [I48.91] 05/09/2014  . AKI (acute kidney injury) ([place]) [N17.9] 05/09/2014  . Swelling of lower extremity [M79.89] 05/09/2014  . Depression [F32.9] 05/09/2014  . Type 2 diabetes, uncontrolled, with renal manifestation ([place]) [E11.29, E11.65] 05/09/2014  . 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 Obesity (BMI 30-39.9) [E66.9] 05/09/2014  . Ascites [R18.8] 05/09/2014    Total Time spent with patient: 1 hour  Subjective:   [name] is a 65 y.o. male patient admitted with depression and lower extremity edema.  HPI:  [name] is a 65 y.o. male seen, chart reviewed for face-to-face psychiatric consultation and evaluation of increase symptoms depression, anxiety and noncompliant with his medication treatment, counseling and treatment for medical conditions. Patient appeared bizarre and weight statements during this evaluation and become a poor historian. Patient stated he cannot endorse that he has been depressed or needed mental health care. Patient stated that he has been living in a house that belongs to sister and  brother-in-law and is supposed to collect rent and deposited bank for them from other 2 tenents but he was not able to do simple tasks also because of feeding helpless, hopeless, worthless, isolated and withdrawn from social activities and therapeutic activities which were required for him. Patient also makes statements that he was trapped has a 65 years old and 66 years old body and also blames his parents and siblings not able to care for him and he wasn't crisis situation since 2004.Marland Kitchen Review of medical records indicated patient has been depressed since he got separated from his wife and children and 2004 and initially he was seen a counselor and also taken psychiatric medication management. Patient was not able to tell me when he stopped taking his medication management and counseling services. Patient stated he he cannot make decisions about getting treatment at this time and also doubts that medication works for him are not. Patient is willing to give a trial of medication and also vague about following up with outpatient medication management at this time.  Medical history: Patient with a past medical history that includes type 2 diabetes uncontrolled, chronic kidney disease stage III, obesity, lower extremity edema, hypertension, chronic systolic heart failure, obstructive sleep apnea A. fib noncompliant with Coumadin presents to the emergency department with chief complaint of worsening lower extremity edema. Initial evaluation reveals acute on chronic systolic heart failure with an elevated troponin. He reports noncompliance with medication and follow-up medical care since 2015. He states he came to the emergency department when his legs swelled to the point that it caused him pain and made it difficult to walk. He denies any chest pain palpitations headache dizziness syncope or near-syncope. He denies  orthopnea cough nausea vomiting. He denies dysuria hematuria frequency or urgency. He denies any recent  fever chills or sick contacts  Past Psychiatric History: He has history of depression since 2004 when he was separated from his wife and was admitted to [facility] several times prior to 2010  Risk to Self: Is patient at risk for suicide?: No Risk to Others:   Prior Inpatient Therapy:   Prior Outpatient Therapy:    Past Medical History:  Past Medical History  Diagnosis Date  . A-fib ([place])     a. (06/04/14) TEE-DC-CV; succesful; large LA 6.2 cm  . Depression   . Type 2 diabetes mellitus ([place])   . CKD (chronic kidney disease) stage 3, GFR 30-59 ml/min   . OSA (obstructive sleep apnea)   . Chronic systolic heart failure ([place])     a. ECHO (05/2014): EF 25-30%, diff HK, RV midly dilated and sys fx mildly/mod reduced  . HTN (hypertension)     Past Surgical History  Procedure Laterality Date  . Vasectomy    . Nose surgery      Nasal septum surgery  . Tee without cardioversion N/A 06/04/2014    Procedure: TRANSESOPHAGEAL ECHOCARDIOGRAM (TEE);  Surgeon: Larey Dresser, MD;  Location: [facility];  Service: Cardiovascular;  Laterality: N/A;  . Cardioversion N/A 06/04/2014    Procedure: CARDIOVERSION;  Surgeon: Larey Dresser, MD;  [facility] ENDOSCOPY;  Service: Cardiovascular;  Laterality: N/A;   Family History:  Family History  Problem Relation Age of Onset  . Heart attack Mother     deceased  . Diabetes Mother    Family Psychiatric  History: Denied Social History:  History  Alcohol Use No     History  Drug Use No    Social History   Social History  . Marital Status: Divorced    Spouse Name: N/A  . Number of Children: N/A  . Years of Education: N/A   Social History Main Topics  . Smoking status: Former Smoker    Types: Cigars  . Smokeless tobacco: None     Comment: Smoked when he was in his teens  . Alcohol Use: No  . Drug Use: No  . Sexual Activity: Not Asked   Other Topics Concern  . None   Social History Narrative   Lives in [place] by himself. Retired from  [employer] and [employer] for [employer].    Additional Social History:    Allergies:  No Known Allergies  Labs:  Results for orders placed or performed during the hospital encounter of 12/28/15 (from the past 48 hour(s))  Basic metabolic panel     Status: Abnormal   Collection Time: 12/28/15 12:19 PM  Result Value Ref Range   Sodium 139 135 - 145 mmol/L   Potassium 4.2 3.5 - 5.1 mmol/L   Chloride 103 101 - 111 mmol/L   CO2 24 22 - 32 mmol/L   Glucose, Bld 162 (H) 65 - 99 mg/dL   BUN 31 (H) 6 - 20 mg/dL   Creatinine, Ser 1.65 (H) 0.61 - 1.24 mg/dL   Calcium 9.1 8.9 - 10.3 mg/dL   GFR calc non Af Amer 42 (L) >60 mL/min   GFR calc Af Amer 49 (L) >60 mL/min    Comment: (NOTE) The eGFR has been calculated using the CKD EPI equation. This calculation has not been validated in all clinical situations. eGFR's persistently <60 mL/min signify possible Chronic Kidney Disease.    Anion gap 12 5 - 15  CBC     Status: Abnormal   Collection Time: 12/28/15 12:19 PM  Result Value Ref Range   WBC 7.3 4.0 - 10.5 K/uL   RBC 4.47 4.22 - 5.81 MIL/uL   Hemoglobin 12.5 (L) 13.0 - 17.0 g/dL   HCT 40.1 39.0 - 52.0 %   MCV 89.7 78.0 - 100.0 fL   MCH 28.0 26.0 - 34.0 pg   MCHC 31.2 30.0 - 36.0 g/dL   RDW 14.9 11.5 - 15.5 %   Platelets 183 150 - 400 K/uL  I-stat troponin, ED (not at [facility])     Status: Abnormal   Collection Time: 12/28/15 12:34 PM  Result Value Ref Range   Troponin i, poc 0.19 (HH) 0.00 - 0.08 ng/mL   Comment NOTIFIED PHYSICIAN    Comment 3            Comment: Due to the release kinetics of cTnI, a negative result within the first hours of the onset of symptoms does not rule out myocardial infarction with certainty. If myocardial infarction is still suspected, repeat the test at appropriate intervals.   Ethanol     Status: None   Collection Time: 12/28/15  2:32 PM  Result Value Ref Range   Alcohol, Ethyl (B) <5 <5 mg/dL    Comment:        LOWEST DETECTABLE LIMIT  FOR SERUM ALCOHOL IS 5 mg/dL FOR MEDICAL PURPOSES ONLY   Brain natriuretic peptide     Status: Abnormal   Collection Time: 12/28/15  2:49 PM  Result Value Ref Range   B Natriuretic Peptide 886.8 (H) 0.0 - 100.0 pg/mL  Troponin I     Status: Abnormal   Collection Time: 12/28/15  7:47 PM  Result Value Ref Range   Troponin I 0.27 (H) <0.031 ng/mL    Comment:        PERSISTENTLY INCREASED TROPONIN VALUES IN THE RANGE OF 0.04-0.49 ng/mL CAN BE SEEN IN:       -UNSTABLE ANGINA       -CONGESTIVE HEART FAILURE       -MYOCARDITIS       -CHEST TRAUMA       -ARRYHTHMIAS       -LATE PRESENTING MYOCARDIAL INFARCTION       -COPD   CLINICAL FOLLOW-UP RECOMMENDED.   Hemoglobin A1c     Status: Abnormal   Collection Time: 12/28/15  7:47 PM  Result Value Ref Range   Hgb A1c MFr Bld 6.0 (H) 4.8 - 5.6 %    Comment: (NOTE)         Pre-diabetes: 5.7 - 6.4         Diabetes: >6.4         Glycemic control for adults with diabetes: <7.0    Mean Plasma Glucose 126 mg/dL    Comment: (NOTE) Performed At: [facility] [place], [place] [id number] Lindon Romp MD WC:[id number]   Glucose, capillary     Status: Abnormal   Collection Time: 12/28/15 10:39 PM  Result Value Ref Range   Glucose-Capillary 131 (H) 65 - 99 mg/dL  Heparin level (unfractionated)     Status: None   Collection Time: 12/29/15 12:09 AM  Result Value Ref Range   Heparin Unfractionated 0.37 0.30 - 0.70 IU/mL    Comment:        IF HEPARIN RESULTS ARE BELOW EXPECTED VALUES, AND PATIENT DOSAGE HAS BEEN CONFIRMED, SUGGEST FOLLOW UP TESTING OF ANTITHROMBIN III LEVELS.   Basic metabolic  panel     Status: Abnormal   Collection Time: 12/29/15  4:30 AM  Result Value Ref Range   Sodium 143 135 - 145 mmol/L   Potassium 4.0 3.5 - 5.1 mmol/L   Chloride 104 101 - 111 mmol/L   CO2 26 22 - 32 mmol/L   Glucose, Bld 87 65 - 99 mg/dL   BUN 32 (H) 6 - 20 mg/dL   Creatinine, Ser 1.71 (H) 0.61 - 1.24 mg/dL    Calcium 9.1 8.9 - 10.3 mg/dL   GFR calc non Af Amer 40 (L) >60 mL/min   GFR calc Af Amer 47 (L) >60 mL/min    Comment: (NOTE) The eGFR has been calculated using the CKD EPI equation. This calculation has not been validated in all clinical situations. eGFR's persistently <60 mL/min signify possible Chronic Kidney Disease.    Anion gap 13 5 - 15  Troponin I     Status: Abnormal   Collection Time: 12/29/15  4:30 AM  Result Value Ref Range   Troponin I 0.48 (H) <0.031 ng/mL    Comment:        PERSISTENTLY INCREASED TROPONIN VALUES IN THE RANGE OF 0.04-0.49 ng/mL CAN BE SEEN IN:       -UNSTABLE ANGINA       -CONGESTIVE HEART FAILURE       -MYOCARDITIS       -CHEST TRAUMA       -ARRYHTHMIAS       -LATE PRESENTING MYOCARDIAL INFARCTION       -COPD   CLINICAL FOLLOW-UP RECOMMENDED.   CBC     Status: Abnormal   Collection Time: 12/29/15  4:30 AM  Result Value Ref Range   WBC 6.9 4.0 - 10.5 K/uL   RBC 4.49 4.22 - 5.81 MIL/uL   Hemoglobin 12.3 (L) 13.0 - 17.0 g/dL   HCT 40.2 39.0 - 52.0 %   MCV 89.5 78.0 - 100.0 fL   MCH 27.4 26.0 - 34.0 pg   MCHC 30.6 30.0 - 36.0 g/dL   RDW 14.8 11.5 - 15.5 %   Platelets 178 150 - 400 K/uL  Heparin level (unfractionated)     Status: None   Collection Time: 12/29/15  4:30 AM  Result Value Ref Range   Heparin Unfractionated 0.31 0.30 - 0.70 IU/mL    Comment:        IF HEPARIN RESULTS ARE BELOW EXPECTED VALUES, AND PATIENT DOSAGE HAS BEEN CONFIRMED, SUGGEST FOLLOW UP TESTING OF ANTITHROMBIN III LEVELS.   Protime-INR     Status: Abnormal   Collection Time: 12/29/15  4:30 AM  Result Value Ref Range   Prothrombin Time 17.7 (H) 11.6 - 15.2 seconds   INR 1.45 0.00 - 1.49  Glucose, capillary     Status: Abnormal   Collection Time: 12/29/15 11:33 AM  Result Value Ref Range   Glucose-Capillary 130 (H) 65 - 99 mg/dL   Comment 1 Notify RN   Glucose, capillary     Status: None   Collection Time: 12/29/15  4:27 PM  Result Value Ref Range    Glucose-Capillary 98 65 - 99 mg/dL  Glucose, capillary     Status: None   Collection Time: 12/29/15 10:17 PM  Result Value Ref Range   Glucose-Capillary 90 65 - 99 mg/dL  Glucose, capillary     Status: None   Collection Time: 12/30/15  5:12 AM  Result Value Ref Range   Glucose-Capillary 83 65 - 99 mg/dL  Basic metabolic panel  Status: Abnormal   Collection Time: 12/30/15  6:35 AM  Result Value Ref Range   Sodium 140 135 - 145 mmol/L   Potassium 3.0 (L) 3.5 - 5.1 mmol/L   Chloride 102 101 - 111 mmol/L   CO2 28 22 - 32 mmol/L   Glucose, Bld 132 (H) 65 - 99 mg/dL   BUN 28 (H) 6 - 20 mg/dL   Creatinine, Ser 1.62 (H) 0.61 - 1.24 mg/dL   Calcium 8.7 (L) 8.9 - 10.3 mg/dL   GFR calc non Af Amer 43 (L) >60 mL/min   GFR calc Af Amer 50 (L) >60 mL/min    Comment: (NOTE) The eGFR has been calculated using the CKD EPI equation. This calculation has not been validated in all clinical situations. eGFR's persistently <60 mL/min signify possible Chronic Kidney Disease.    Anion gap 10 5 - 15  CBC     Status: Abnormal   Collection Time: 12/30/15  6:35 AM  Result Value Ref Range   WBC 5.3 4.0 - 10.5 K/uL   RBC 4.13 (L) 4.22 - 5.81 MIL/uL   Hemoglobin 11.9 (L) 13.0 - 17.0 g/dL   HCT 37.1 (L) 39.0 - 52.0 %   MCV 89.8 78.0 - 100.0 fL   MCH 28.8 26.0 - 34.0 pg   MCHC 32.1 30.0 - 36.0 g/dL   RDW 15.1 11.5 - 15.5 %   Platelets 124 (L) 150 - 400 K/uL  Heparin level (unfractionated)     Status: None   Collection Time: 12/30/15  6:35 AM  Result Value Ref Range   Heparin Unfractionated 0.38 0.30 - 0.70 IU/mL    Comment:        IF HEPARIN RESULTS ARE BELOW EXPECTED VALUES, AND PATIENT DOSAGE HAS BEEN CONFIRMED, SUGGEST FOLLOW UP TESTING OF ANTITHROMBIN III LEVELS.     Current Facility-Administered Medications  Medication Dose Route Frequency Provider Last Rate Last Dose  . 0.9 %  sodium chloride infusion  250 mL Intravenous PRN Radene Gunning, NP      . acetaminophen (TYLENOL) tablet 650  mg  650 mg Oral Q4H PRN Radene Gunning, NP      . digoxin Fonnie Birkenhead) tablet 0.125 mg  0.125 mg Oral Daily Lezlie Octave Black, NP   0.125 mg at 12/30/15 0844  . furosemide (LASIX) injection 80 mg  80 mg Intravenous BID Radene Gunning, NP   80 mg at 12/30/15 0844  . heparin ADULT infusion 100 units/mL (25000 units/250 mL)  1,300 Units/hr Intravenous Continuous Andi Devon Combs, RPH 13 mL/hr at 12/30/15 0508 1,300 Units/hr at 12/30/15 0508  . hydrOXYzine (ATARAX/VISTARIL) tablet 25 mg  25 mg Oral Q8H PRN Lezlie Octave Black, NP      . insulin aspart (novoLOG) injection 0-15 Units  0-15 Units Subcutaneous TID WC Radene Gunning, NP   2 Units at 12/29/15 1242  . insulin aspart (novoLOG) injection 0-5 Units  0-5 Units Subcutaneous QHS Radene Gunning, NP   0 Units at 12/28/15 2200  . ondansetron (ZOFRAN) injection 4 mg  4 mg Intravenous Q6H PRN Lezlie Octave Black, NP      . sodium chloride flush (NS) 0.9 % injection 3 mL  3 mL Intravenous Q12H Lezlie Octave Black, NP   3 mL at 12/30/15 1000  . sodium chloride flush (NS) 0.9 % injection 3 mL  3 mL Intravenous PRN Radene Gunning, NP        Musculoskeletal: Strength & Muscle Tone: decreased Gait & Station:  unable to stand Patient leans: N/A  Psychiatric Specialty Exam: ROS chest pain or leg swellings, poor nutrition and denied shortness of breath, nausea, vomiting and abdominal pain No Fever-chills, No Headache, No changes with Vision or hearing, reports vertigo No problems swallowing food or Liquids, No Chest pain, Cough or Shortness of Breath, No Abdominal pain, No Nausea or Vommitting, Bowel movements are regular, No Blood in stool or Urine, No dysuria, No new skin rashes or bruises, No new joints pains-aches,  No new weakness, tingling, numbness in any extremity, No recent weight gain or loss, No polyuria, polydypsia or polyphagia,  A full 10 point Review of Systems was done, except as stated above, all other Review of Systems were negative.  Blood pressure 140/99,  pulse 118, temperature 97.8 F (36.6 C), temperature source Oral, resp. rate 17, height 5' 8" (1.727 m), weight 97.796 kg (215 lb 9.6 oz), SpO2 98 %.Body mass index is 32.79 kg/(m^2).  General Appearance: Bizarre and Disheveled  Eye Contact::  Good  Speech:  Clear and Coherent  Volume:  Decreased  Mood:  Anxious and Depressed  Affect:  Depressed and Inappropriate  Thought Process:  Disorganized, Irrelevant and Tangential  Orientation:  Full (Time, Place, and Person)  Thought Content:  Rumination  Suicidal Thoughts:  No  Homicidal Thoughts:  No  Memory:  Immediate;   Fair Recent;   Fair  Judgement:  Impaired  Insight:  Fair  Psychomotor Activity:  Decreased  Concentration:  Fair  Recall:  [place] of Knowledge:Good  Language: Good  Akathisia:  Negative  Handed:  Right  AIMS (if indicated):     Assets:  Communication Skills Desire for Improvement Financial Resources/Insurance Housing Leisure Time Resilience Transportation  ADL's:  Impaired  Cognition: WNL  Sleep:      Treatment Plan Summary: Daily contact with patient to assess and evaluate symptoms and progress in treatment and Medication management  Patient has no safety concerns Will give a trial of Lexapro 10 mg daily for depression and anxiety and Abilify 2 mg twice daily for bizarre thought processes and boosting antidepressant medication. Monitor for the adverse effect of the medications Appreciate psychiatric consultation and follow up as clinically required Please contact [phone] or [phone] if needs further assistance  Disposition: Patient does not meet criteria for psychiatric inpatient admission. Supportive therapy provided about ongoing stressors.  Durward Parcel., MD 12/30/2015 10:44 AM

## 2015-12-30 NOTE — Progress Notes (Signed)
ANTICOAGULATION CONSULT NOTE   Pharmacy Consult for Heparin Indication: atrial fibrillation  No Known Allergies  Patient Measurements: Height:  (172.7 cm) Weight: 215 lb 9.6 oz (97.796 kg) (Scale B) IBW/kg (Calculated) : 68.4 Heparin Dosing Weight: 91.5 kg  Vital Signs: Temp: 98 F (36.7 C) (02/20 0458) Temp Source: Oral (02/20 0458) BP: 134/93 mmHg (02/20 0458) Pulse Rate: 119 (02/20 0458)  Labs:  Recent Labs  12/28/15 1219 12/28/15 1947 12/29/15 0009 12/29/15 0430 12/30/15 0635  HGB 12.5*  --   --  12.3* 11.9*  HCT 40.1  --   --  40.2 37.1*  PLT 183  --   --  178 124*  LABPROT  --   --   --  17.7*  --   INR  --   --   --  1.45  --   HEPARINUNFRC  --   --  0.37 0.31 0.38  CREATININE 1.65*  --   --  1.71* 1.62*  TROPONINI  --  0.27*  --  0.48*  --     Estimated Creatinine Clearance: 51.6 mL/min (by C-G formula based on Cr of 1.62).   Medical History: Past Medical History  Diagnosis Date  . A-fib ([facility])     a. (06/04/14) TEE-DC-CV; succesful; large LA 6.2 cm  . Depression   . Type 2 diabetes mellitus ([facility])   . CKD (chronic kidney disease) stage 3, GFR 30-59 ml/min   . OSA (obstructive sleep apnea)   . Chronic systolic heart failure ([facility])     a. ECHO (05/2014): EF 25-30%, diff HK, RV midly dilated and sys fx mildly/mod reduced  . HTN (hypertension)     Medications:  Prescriptions prior to admission  Medication Sig Dispense Refill Last Dose  . carvedilol (COREG) 12.5 MG tablet Take 1 tablet (12.5 mg total) by mouth 2 (two) times daily with a meal. (Patient not taking: Reported on 12/28/2015) 60 tablet 3 Not Taking at Unknown time  . digoxin (LANOXIN) 0.125 MG tablet Take 1 tablet (0.125 mg total) by mouth daily. (Patient not taking: Reported on 12/28/2015) 30 tablet 3 Not Taking at Unknown time  . furosemide (LASIX) 40 MG tablet Take 40 mg (1 tablet) in the morning and 20 mg (1/2 tablet) in the evening. (Patient not taking: Reported on 12/28/2015) 45 tablet 3  Not Taking at Unknown time  . hydrOXYzine (ATARAX/VISTARIL) 25 MG tablet Take 1 tablet (25 mg total) by mouth every 8 (eight) hours as needed for itching. (Patient not taking: Reported on 12/28/2015) 30 tablet 0 Not Taking at Unknown time  . lisinopril (PRINIVIL,ZESTRIL) 2.5 MG tablet Take 1 tablet (2.5 mg total) by mouth 2 (two) times daily. (Patient not taking: Reported on 12/28/2015) 60 tablet 3 Not Taking at Unknown time  . metFORMIN (GLUCOPHAGE) 500 MG tablet Take 1 tablet (500 mg total) by mouth daily with breakfast. (Patient not taking: Reported on 12/28/2015) 60 tablet 0 Not Taking at Unknown time  . permethrin (ELIMITE) 5 % cream Apply from chin to toes, leave 8 hours, then shower. Repeat x 1 in one week. (Patient not taking: Reported on 12/28/2015) 60 g 1 Not Taking at Unknown time  . spironolactone (ALDACTONE) 25 MG tablet Take 0.5 tablets (12.5 mg total) by mouth daily. (Patient not taking: Reported on 12/28/2015) 45 tablet 3 Not Taking at Unknown time  . warfarin (COUMADIN) 5 MG tablet 5 mg (1 tab) on Sundays, Tues, Thursdays & Saturdays 7.5 mg (1 & half tabs) on Mon, Wed,  Fridays (Patient not taking: Reported on 12/28/2015) 120 tablet 1 Not Taking at Unknown time   Scheduled:  . digoxin  0.125 mg Oral Daily  . furosemide  80 mg Intravenous BID  . insulin aspart  0-15 Units Subcutaneous TID WC  . insulin aspart  0-5 Units Subcutaneous QHS  . sodium chloride flush  3 mL Intravenous Q12H   Infusions:  . heparin 1,300 Units/hr (12/30/15 3212)    Assessment: 65yo male with history of Afib not taking his warfarin PTA presents with edema. Pharmacy is consulted to dose heparin for atrial fibrillation. Pt reports that he doesn't take any of his home meds including his coumadin.   HL currently therapeutic at 0.38, CBC stable, no s/sx bleeding noted.   Goal of Therapy:  Heparin level 0.3-0.7 units/ml Monitor platelets by anticoagulation protocol: Yes   Plan:  -Continue IV heparin at  current rate. -Daily CBC, HL -Monitor for s/sx of bleeding.   Tad Moore, BCPS  Clinical Pharmacist Pager [phone]  12/30/2015 7:57 AM

## 2015-12-31 ENCOUNTER — Encounter (HOSPITAL_COMMUNITY): Payer: Self-pay | Admitting: General Practice

## 2015-12-31 ENCOUNTER — Inpatient Hospital Stay (HOSPITAL_COMMUNITY): Payer: Medicare Other

## 2015-12-31 DIAGNOSIS — N182 Chronic kidney disease, stage 2 (mild): Secondary | ICD-10-CM

## 2015-12-31 DIAGNOSIS — I10 Essential (primary) hypertension: Secondary | ICD-10-CM

## 2015-12-31 DIAGNOSIS — R7989 Other specified abnormal findings of blood chemistry: Secondary | ICD-10-CM

## 2015-12-31 LAB — NM MYOCAR MULTI W/SPECT W/WALL MOTION / EF
CHL CUP RESTING HR STRESS: 122 {beats}/min
Peak HR: 126 {beats}/min

## 2015-12-31 LAB — BASIC METABOLIC PANEL
ANION GAP: 15 (ref 5–15)
BUN: 27 mg/dL — ABNORMAL HIGH (ref 6–20)
CHLORIDE: 98 mmol/L — AB (ref 101–111)
CO2: 30 mmol/L (ref 22–32)
Calcium: 8.8 mg/dL — ABNORMAL LOW (ref 8.9–10.3)
Creatinine, Ser: 1.66 mg/dL — ABNORMAL HIGH (ref 0.61–1.24)
GFR, EST AFRICAN AMERICAN: 48 mL/min — AB (ref >60)
GFR, EST NON AFRICAN AMERICAN: 42 mL/min — AB (ref >60)
Glucose, Bld: 108 mg/dL — ABNORMAL HIGH (ref 65–99)
POTASSIUM: 3.3 mmol/L — AB (ref 3.5–5.1)
SODIUM: 143 mmol/L (ref 135–145)

## 2015-12-31 LAB — CBC
HEMATOCRIT: 37.8 % — AB (ref 39.0–52.0)
Hemoglobin: 12 g/dL — ABNORMAL LOW (ref 13.0–17.0)
MCH: 28.8 pg (ref 26.0–34.0)
MCHC: 31.7 g/dL (ref 30.0–36.0)
MCV: 90.6 fL (ref 78.0–100.0)
Platelets: 148 10*3/uL — ABNORMAL LOW (ref 150–400)
RBC: 4.17 MIL/uL — ABNORMAL LOW (ref 4.22–5.81)
RDW: 15 % (ref 11.5–15.5)
WBC: 6 10*3/uL (ref 4.0–10.5)

## 2015-12-31 LAB — GLUCOSE, CAPILLARY
GLUCOSE-CAPILLARY: 131 mg/dL — AB (ref 65–99)
Glucose-Capillary: 97 mg/dL (ref 65–99)
Glucose-Capillary: 136 mg/dL — ABNORMAL HIGH (ref 65–99)
Glucose-Capillary: 122 mg/dL — ABNORMAL HIGH (ref 65–99)

## 2015-12-31 LAB — HEPARIN LEVEL (UNFRACTIONATED): Heparin Unfractionated: 0.35 IU/mL (ref 0.30–0.70)

## 2015-12-31 MED ORDER — REGADENOSON 0.4 MG/5ML IV SOLN
INTRAVENOUS | Status: AC
  Administered 2015-12-31: 0.4 mg via INTRAVENOUS
  Filled 2015-12-31: qty 5

## 2015-12-31 MED ORDER — WARFARIN SODIUM 5 MG PO TABS
5.0000 mg | ORAL_TABLET | Freq: Once | ORAL | Status: DC
  Filled 2015-12-31: qty 1

## 2015-12-31 MED ORDER — TECHNETIUM TC 99M SESTAMIBI GENERIC - CARDIOLITE
10.0000 | Freq: Once | INTRAVENOUS | Status: AC | PRN
Start: 2015-12-31 — End: 2015-12-31
  Administered 2015-12-31: 10 via INTRAVENOUS

## 2015-12-31 MED ORDER — METOPROLOL TARTRATE 1 MG/ML IV SOLN
INTRAVENOUS | Status: AC
  Filled 2015-12-31: qty 5

## 2015-12-31 MED ORDER — REGADENOSON 0.4 MG/5ML IV SOLN
0.4000 mg | Freq: Once | INTRAVENOUS | Status: AC
  Administered 2015-12-31: 0.4 mg via INTRAVENOUS
  Filled 2015-12-31: qty 5

## 2015-12-31 MED ORDER — WARFARIN - PHARMACIST DOSING INPATIENT
Freq: Every day | Status: DC
Start: 2015-12-31 — End: 2016-01-10
  Administered 2016-01-01 – 2016-01-03 (×3)

## 2015-12-31 MED ORDER — FUROSEMIDE 80 MG PO TABS
80.0000 mg | ORAL_TABLET | Freq: Two times a day (BID) | ORAL | Status: DC
  Administered 2015-12-31 – 2016-01-10 (×20): 80 mg via ORAL
  Filled 2015-12-31 (×19): qty 1

## 2015-12-31 MED ORDER — LOSARTAN POTASSIUM 25 MG PO TABS
25.0000 mg | ORAL_TABLET | Freq: Every day | ORAL | Status: DC
  Administered 2016-01-01 – 2016-01-10 (×10): 25 mg via ORAL
  Filled 2015-12-31 (×10): qty 1

## 2015-12-31 MED ORDER — METOPROLOL TARTRATE 1 MG/ML IV SOLN
5.0000 mg | Freq: Once | INTRAVENOUS | Status: AC
  Administered 2015-12-31: 5 mg via INTRAVENOUS

## 2015-12-31 MED ORDER — METOPROLOL SUCCINATE ER 50 MG PO TB24
50.0000 mg | ORAL_TABLET | Freq: Every day | ORAL | Status: DC
  Administered 2016-01-01 – 2016-01-04 (×4): 50 mg via ORAL
  Filled 2015-12-31 (×5): qty 1

## 2015-12-31 MED ORDER — HEPARIN (PORCINE) IN NACL 100-0.45 UNIT/ML-% IJ SOLN
1600.0000 [IU]/h | INTRAMUSCULAR | Status: DC
  Administered 2016-01-01: 1600 [IU]/h via INTRAVENOUS
  Administered 2016-01-01: 1300 [IU]/h via INTRAVENOUS
  Administered 2016-01-02 – 2016-01-06 (×5): 1600 [IU]/h via INTRAVENOUS
  Filled 2015-12-31 (×8): qty 250

## 2015-12-31 MED ORDER — TECHNETIUM TC 99M SESTAMIBI GENERIC - CARDIOLITE
30.0000 | Freq: Once | INTRAVENOUS | Status: AC | PRN
  Administered 2015-12-31: 30 via INTRAVENOUS

## 2015-12-31 MED ORDER — METOPROLOL SUCCINATE ER 25 MG PO TB24
25.0000 mg | ORAL_TABLET | Freq: Once | ORAL | Status: AC
  Administered 2015-12-31: 25 mg via ORAL
  Filled 2015-12-31: qty 1

## 2015-12-31 NOTE — Progress Notes (Signed)
Subjective: No CP, SOB, orthopnea  Objective: Vital signs in last 24 hours: Temp:  [97.4 F (36.3 C)-98.1 F (36.7 C)] 97.4 F (36.3 C) (02/21 0509) Pulse Rate:  [86-122] 121 (02/21 1012) Resp:  [18] 18 (02/21 0509) BP: (133-160)/(40-125) 158/120 mmHg (02/21 1012) SpO2:  [95 %-98 %] 98 % (02/21 0509) Weight:  [209 lb 9.6 oz (95.074 kg)] 209 lb 9.6 oz (95.074 kg) (02/21 0509) Last BM Date: 12/30/15  Intake/Output from previous day: 02/20 0701 - 02/21 0700 In: 1262 [P.O.:840; I.V.:422] Out: 4875 [Urine:4875] Intake/Output this shift: Total I/O In: 0  Out: 250 [Urine:250]  Medications Scheduled Meds: . ARIPiprazole  2 mg Oral BID  . digoxin  0.125 mg Oral Daily  . escitalopram  10 mg Oral Daily  . furosemide  80 mg Intravenous BID  . insulin aspart  0-15 Units Subcutaneous TID WC  . insulin aspart  0-5 Units Subcutaneous QHS  . metoprolol      . metoprolol succinate  25 mg Oral Daily  . potassium chloride  40 mEq Oral Daily  . regadenoson      . regadenoson  0.4 mg Intravenous Once  . sodium chloride flush  3 mL Intravenous Q12H   Continuous Infusions: . heparin Stopped (12/31/15 0923)   PRN Meds:.sodium chloride, acetaminophen, hydrOXYzine, ondansetron (ZOFRAN) IV, sodium chloride flush  PE: General appearance: alert, cooperative and no distress Lungs: clear to auscultation bilaterally Heart: Reg rhythm.  Rate fast.  No MRG Abdomen: +BS, nontender Extremities: 1+ LEE Pulses: 2+ and symmetric Skin: Warm and dry Neurologic: grossly normal  Lab Results:   Recent Labs  12/29/15 0430 12/30/15 0635 12/31/15 0340  WBC 6.9 5.3 6.0  HGB 12.3* 11.9* 12.0*  HCT 40.2 37.1* 37.8*  PLT 178 124* 148*   BMET  Recent Labs  12/29/15 0430 12/30/15 0635 12/31/15 0340  NA 143 140 143  K 4.0 3.0* 3.3*  CL 104 102 98*  CO2 GLUCOSE 87 132* 108*  BUN 32* 28* 27*  CREATININE 1.71* 1.62* 1.66*  CALCIUM 9.1 8.7* 8.8*   PT/INR  Recent Labs  12/29/15 0430  LABPROT 17.7*  INR 1.45     Assessment/Plan  1. Acute on chronic systolic HF Net fluids:  -3.6L/-9.8, SCr stable. 1.66 Echo 05/09/2014 EF 25-30%, moderate LVH, peak PA pressure New echo:  EF 20-25%, diffuse hypokinesis.  There is akinesis of the basal-mid anteroseptal and apical anteriormyocardium. There is akinesis of the basalinferior myocardium.  LA severely dilated.  PA pressure .  close to euvolemic at this time, Lungs clear.  1+LEE, Transition to PO lasix today.   BID already had AM IV dose.  Weight down from 232 --> 209 lbs.   2. aflutter with RVR - CHA2DS2-Vasc score 4 (HTN, DM, age, HF) - INR subtherapeutic due to noncompliance. Current on IV heparin given elevated trop, will need to  restart coumadin at some point. -Metoprolol 25 BID as of yesterday.   HR still persistently in 120s, Toprolol 25 added yesterday.  I had the RN in Nuc Med give it early today.  I also ordered  IV lopressor and his HR did not budge.   Increase toprolol to 50.    He may need TEE/DCCV or TEE then Amiodarone, given his left atrium is severely dilated, then DCCV before discharge.   3. Elevated trop - likely due to volume overload and renal issue,  Wall motion abnormalities on echo. - Trop 0.27 --> 0.48.  Lexiscan completed this morning  4. Acute on chronic renal insufficiency baseline Cr 1.1 in 05/2014, now Cr 1.6-1.7, unclear how much of this is acute on chronic. Likely related to noncompliance with medication  5. OSA 6. DM 7. H/o afib 8. Psychosocial issue: Per hospital team. His chronic management will not be successful without these issues being addressed. IM called psych consult   LOS: 3 days   HAGER, BRYAN PA-C 12/31/2015 10:24 AM  The patient was seen, examined and discussed with Wilburt Finlay, PA-C and I agree with the above.   Negative stress test with  old scar and no ischemia, LVEF 20%, he is almost euvolemic, I will switch to PO diuretics 80 mg PO BID, previously 40 mg in the am and 20 mg in the pm.  Crea is stable. We will check INR in the am, started on coumadin today, on heparin drip for bridging. He remains in a-flutter, we will schedule a TEE/DCCV prior to discharge. We will start losartan and monitor his crea closely.  Lars Masson 12/31/2015

## 2015-12-31 NOTE — Progress Notes (Signed)
1 min, [name] at bedside, pt denies any symptoms

## 2015-12-31 NOTE — Progress Notes (Signed)
Entered in error

## 2015-12-31 NOTE — Progress Notes (Signed)
3 mins, [name] at bedside, pt states "I feel an expansion in my forehead."

## 2015-12-31 NOTE — Progress Notes (Signed)
[facility]                                                                              Patient Demographics  [name], is a 65 y.o. male, DOB - 1951-11-01, ZOX:[id number]  Admit date - 12/28/2015   Admitting Physician Calvert Cantor, MD  Outpatient Primary MD for the patient is [name], MD  LOS - 3   Chief Complaint  Patient presents with  . Leg Swelling       Brief HPI   [name] is a 65 y.o. male with a past medical history that includes type 2 diabetes uncontrolled, chronic kidney disease stage III, obesity, lower extremity edema, hypertension, chronic systolic heart failure, obstructive sleep apnea A. fib noncompliant with Coumadin presents to the emergency department with chief complaint of worsening lower extremity edema. Initial evaluation reveals acute on chronic systolic heart failure with an elevated troponin. Information is obtained from the patient. He reported noncompliance with medication and follow-up medical care since 2015. He stated he came to the emergency department when his legs swelled to the point that it caused him pain and made it difficult to walk. He denied any chest pain palpitations headache dizziness syncope or near-syncope. He denied orthopnea cough nausea vomiting. He denied dysuria hematuria frequency or urgency.    Patient was admitted with acute on chronic systolic CHF, severely noncompliant for last 2 years with follow-ups and medications. He was placed on aggressive IV diuresis by cardiology. For his atrial fib with RVR, patient placed on heparin drip and digoxin with no improvement in heart rate, cardiology considering cardioversion. Patient underwent stress test today with diffuse hypokinesia, EF 20%. Awaiting further plan from cardiology.   Assessment & Plan    Principal Problem: Acute on chronic systolic heart failure.  - Chest x-ray showed vascular congestion, BNP elevated at 886 with positive  troponins. - Echo in 2015 with an EF of 25%. Has been noncompliant with medications for almost 2 years.  - Continue aggressive IV diuresis - Negative balance of 9.8 L, which down from 232 -> 209 lbs  - 2-D echo 2/20 showed EF of 20-25% with severe diffuse hypokinesis. 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  - Awaiting further cardiology recommendations  Active problems Hypertension.  Home medications include Coreg Lasix lisinopril, not taking these medications for over a year - Continue Lasix  Atrial fibrillation: With RVR - TEE DCCV July 2015, has been on Coumadin in the past but now noncompliant.  - on oral digoxin, started on metoprolol however heart rate still persistently elevated - Per cardiology, now in 2:1 atrial flutter, will need TEE/DCCV - Started on Coumadin by cardiology today   Elevated troponin. - Per cardiology, doubt acute ischemic syndrome, continue heparin drip until anticoagulation is decided - 2-D echo with EF 20-25% with severe diffuse hypokinesis, high risk stress test today, await further recommendations from cardiology   Acute on Chronic kidney disease. Stage III. Creatinine 1.6 on admission. This is slightly above baseline. -Creatinine stable, improving   Depression/ psychosocial  issues/ severe noncompliance/ bizarre thought process -  highly appreciate psychiatry recommendations, patient started on Lexapro and Abilify  Diabetes. On oral agents in the past. Serum glucose 162 on admission -Hold metformin, hemoglobin A1c 6.0 -Continue Sliding scale insulin  Code Status: Full CODE STATUS  Family Communication: Discussed in detail with the patient, all imaging results, lab results explained to the patient    Disposition Plan:   Time Spent in minutes  25 minutes  Procedures    Consults   Cardiology  DVT Prophylaxis  heparin gtt off, started on  Coumadin  Medications  Scheduled Meds: . ARIPiprazole  2 mg Oral BID  . digoxin  0.125 mg Oral Daily  . escitalopram  10 mg Oral Daily  . furosemide  80 mg Oral BID  . insulin aspart  0-15 Units Subcutaneous TID WC  . insulin aspart  0-5 Units Subcutaneous QHS  . metoprolol      . [START ON 01/01/2016] metoprolol succinate  50 mg Oral Daily  . potassium chloride  40 mEq Oral Daily  . sodium chloride flush  3 mL Intravenous Q12H  . warfarin  5 mg Oral ONCE-1800  . Warfarin - Pharmacist Dosing Inpatient   Does not apply q1800   Continuous Infusions: . heparin     PRN Meds:.sodium chloride, acetaminophen, hydrOXYzine, ondansetron (ZOFRAN) IV, sodium chloride flush   Antibiotics   Anti-infectives    None        Subjective:   [name] was seen and examined today.  Heart rate still not well controlled. Resting, denies any chest pain or shortness of breath at the time. No nausea, vomiting, abdominal pain. No acute events overnight.   Objective:   Filed Vitals:   12/31/15 1033 12/31/15 1035 12/31/15 1222 12/31/15 1229  BP: 154/101 159/103  145/98  Pulse: 122 122 121 121  Temp:    97.8 F (36.6 C)  TempSrc:    Oral  Resp:    18  Height:      Weight:      SpO2:    98%    Intake/Output Summary (Last 24 hours) at 12/31/15 1514 Last data filed at 12/31/15 1405  Gross per 24 hour  Intake    968 ml  Output   3150 ml  Net  -2182 ml     Wt Readings from Last 3 Encounters:  12/31/15 95.074 kg (209 lb 9.6 oz)  06/19/14 96.333 kg (212 lb 6 oz)  05/21/14 103.874 kg (229 lb)     Exam  General: Alert and oriented x 3, NAD  HEENT:   Neck: Supple, +JVD  CVS: irregularly irregular, tachycardia  Respiratory: Fairly clear to auscultation, no wheezing or rhonchi  Abdomen: Soft, nontender, nondistended, + bowel sounds  Ext: no cyanosis clubbing, 1+ edema  Neuro: no new deficits  Skin: No rashes  Psych: Normal affect and demeanor, alert and oriented x3     Data Review   Micro Results No results found for this or any previous visit (from the past 240 hour(s)).  Radiology Reports Dg Chest 2 View  12/28/2015  CLINICAL DATA:  Acute chest pain shortness of breath, diabetes, hypertension EXAM: CHEST  2 VIEW COMPARISON:  05/08/2014 FINDINGS: Stable cardiomegaly with slight vascular congestion. Minor streaky bibasilar densities and trace pleural effusions. No definite focal pneumonia. No current CHF or pneumothorax. Trachea midline. Degenerative changes of the spine. IMPRESSION: cardiomegaly with trace pleural effusions and bibasilar atelectasis. Electronically Signed   By: Judie Petit.  Shick M.D.  On: 12/28/2015 12:45   Nm Myocar Multi W/spect W/wall Motion / Ef  12/31/2015  CLINICAL DATA:  Atrial fibrillation, CHF.  Diabetes, hypertension. EXAM: MYOCARDIAL IMAGING WITH SPECT (REST AND PHARMACOLOGIC-STRESS) GATED LEFT VENTRICULAR WALL MOTION STUDY LEFT VENTRICULAR EJECTION FRACTION TECHNIQUE: Standard myocardial SPECT imaging was performed after resting intravenous injection of 10 mCi Tc-82m sestamibi. Subsequently, intravenous infusion of Lexiscan was performed under the supervision of the Cardiology staff. At peak effect of the drug, 30 mCi Tc-13m sestamibi was injected intravenously and standard myocardial SPECT imaging was performed. Quantitative gated imaging was also performed to evaluate left ventricular wall motion, and estimate left ventricular ejection fraction. COMPARISON:  None. FINDINGS: Perfusion: Next defects noted on rest and stress images within the inferior wall and to lesser extent the septum. No reversible defects. Wall Motion: Severe generalized hypokinesia. Left ventricle is dilute Left Ventricular Ejection Fraction: 20 % End diastolic volume 218 ml End systolic volume 173 ml IMPRESSION: 1. 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 No evidence of ischemia. 2. Severe generalized hypokinesia with dilated left ventricle.  3. Left ventricular ejection fraction 20% 4. High-risk stress test findings*. *2012 Appropriate Use Criteria for Coronary Revascularization Focused Update: J Am Coll Cardiol. 2012;59(9):857-881. http://content.dementiazones.com.aspx?articleid=[email or web address] Electronically Signed   By: Charlett Nose M.D.   On: 12/31/2015 14:09    CBC  Recent Labs Lab 12/28/15 1219 12/29/15 0430 12/30/15 0635 12/31/15 0340  WBC 7.3 6.9 5.3 6.0  HGB 12.5* 12.3* 11.9* 12.0*  HCT 40.1 40.2 37.1* 37.8*  PLT 183 178 124* 148*  MCV 89.7 89.5 89.8 90.6  MCH 28.0 27.4 28.8 28.8  MCHC 31.2 30.6 32.1 31.7  RDW 14.9 14.8 15.1 15.0    Chemistries   Recent Labs Lab 12/28/15 1219 12/29/15 0430 12/30/15 0635 12/31/15 0340  NA 139 143 140 143  K 4.2 4.0 3.0* 3.3*  CL 103 104 102 98*  CO2 GLUCOSE 162* 87 132* 108*  BUN 31* 32* 28* 27*  CREATININE 1.65* 1.71* 1.62* 1.66*  CALCIUM 9.1 9.1 8.7* 8.8*   ------------------------------------------------------------------------------------------------------------------ estimated creatinine clearance is 49.6 mL/min (by C-G formula based on Cr of 1.66). ------------------------------------------------------------------------------------------------------------------  Recent Labs  12/28/15 1947  HGBA1C 6.0*   ------------------------------------------------------------------------------------------------------------------ No results for input(s): CHOL, HDL, LDLCALC, TRIG, CHOLHDL, LDLDIRECT in the last 72 hours. ------------------------------------------------------------------------------------------------------------------ No results for input(s): TSH, T4TOTAL, T3FREE, THYROIDAB in the last 72 hours.  Invalid input(s): FREET3 ------------------------------------------------------------------------------------------------------------------ No results for input(s): VITAMINB12, FOLATE, FERRITIN, TIBC, IRON, RETICCTPCT in the last 72  hours.  Coagulation profile  Recent Labs Lab 12/29/15 0430  INR 1.45    No results for input(s): DDIMER in the last 72 hours.  Cardiac Enzymes  Recent Labs Lab 12/28/15 1947 12/29/15 0430 12/30/15 1611  TROPONINI 0.27* 0.48* 0.31*   ------------------------------------------------------------------------------------------------------------------ Invalid input(s): POCBNP   Recent Labs  12/30/15 0512 12/30/15 1132 12/30/15 1639 12/30/15 2058 12/31/15 0617 12/31/15 1151  GLUCAP 83 111* 143* 110* 97 136*     Katelen Luepke M.D. [facility] 12/31/2015, 3:14 PM  Pager: [phone] Between 7am to 7pm - call Pager - [phone]  After 7pm go to [email or web address] - password [email or web address]  Call night coverage person covering after 7pm

## 2015-12-31 NOTE — Progress Notes (Signed)
5 mins, [name] at bedside, pt denies any symptoms. Procedure ended

## 2015-12-31 NOTE — Progress Notes (Signed)
Pt to Nuc Med with a BP 153/110 and HR 122. Pt also with elevated troponin's x3. This RN spoke with Sunday Corn PA about continuing with Lexiscan. He informed RN to have the floor send patient's Metoprolol XR 25 mg PO. Floor contacted, waiting for Patients dose.  Victorino Dike RN

## 2015-12-31 NOTE — Progress Notes (Addendum)
1346 referred and spoken with Carlean Jews ,cardiology PA  Resumed heparin drip and will speak  With pharmacy for coumadin consult

## 2015-12-31 NOTE — Progress Notes (Signed)
ANTICOAGULATION CONSULT NOTE   Pharmacy Consult for Heparin / warfarin Indication: atrial fibrillation  No Known Allergies  Patient Measurements: Height: 5\' 8"  (172.7 cm) Weight: 209 lb 9.6 oz (95.074 kg) (scale b) IBW/kg (Calculated) : 68.4 Heparin Dosing Weight: 91.5 kg  Vital Signs: Temp: 97.8 F (36.6 C) (02/21 1229) Temp Source: Oral (02/21 1229) BP: 145/98 mmHg (02/21 1229) Pulse Rate: 121 (02/21 1229)  Labs:  Recent Labs  12/28/15 1947  12/29/15 0430 12/30/15 0635 12/30/15 1611 12/31/15 0340  HGB  --   < > 12.3* 11.9*  --  12.0*  HCT  --   --  40.2 37.1*  --  37.8*  PLT  --   --  178 124*  --  148*  LABPROT  --   --  17.7*  --   --   --   INR  --   --  1.45  --   --   --   HEPARINUNFRC  --   < > 0.31 0.38  --  0.35  CREATININE  --   --  1.71* 1.62*  --  1.66*  TROPONINI 0.27*  --  0.48*  --  0.31*  --   < > = values in this interval not displayed.  Estimated Creatinine Clearance: 49.6 mL/min (by C-G formula based on Cr of 1.66).   Medical History: Past Medical History  Diagnosis Date  . A-fib (HCC)     a. (06/04/14) TEE-DC-CV; succesful; large LA 6.2 cm  . Depression   . Type 2 diabetes mellitus (HCC)   . CKD (chronic kidney disease) stage 3, GFR 30-59 ml/min   . OSA (obstructive sleep apnea)   . Chronic systolic heart failure (HCC)     a. ECHO (05/2014): EF 25-30%, diff HK, RV midly dilated and sys fx mildly/mod reduced  . HTN (hypertension)     Medications:  Prescriptions prior to admission  Medication Sig Dispense Refill Last Dose  . carvedilol (COREG) 12.5 MG tablet Take 1 tablet (12.5 mg total) by mouth 2 (two) times daily with a meal. (Patient not taking: Reported on 12/28/2015) 60 tablet 3 Not Taking at Unknown time  . digoxin (LANOXIN) 0.125 MG tablet Take 1 tablet (0.125 mg total) by mouth daily. (Patient not taking: Reported on 12/28/2015) 30 tablet 3 Not Taking at Unknown time  . furosemide (LASIX) 40 MG tablet Take 40 mg (1 tablet) in the  morning and 20 mg (1/2 tablet) in the evening. (Patient not taking: Reported on 12/28/2015) 45 tablet 3 Not Taking at Unknown time  . hydrOXYzine (ATARAX/VISTARIL) 25 MG tablet Take 1 tablet (25 mg total) by mouth every 8 (eight) hours as needed for itching. (Patient not taking: Reported on 12/28/2015) 30 tablet 0 Not Taking at Unknown time  . lisinopril (PRINIVIL,ZESTRIL) 2.5 MG tablet Take 1 tablet (2.5 mg total) by mouth 2 (two) times daily. (Patient not taking: Reported on 12/28/2015) 60 tablet 3 Not Taking at Unknown time  . metFORMIN (GLUCOPHAGE) 500 MG tablet Take 1 tablet (500 mg total) by mouth daily with breakfast. (Patient not taking: Reported on 12/28/2015) 60 tablet 0 Not Taking at Unknown time  . permethrin (ELIMITE) 5 % cream Apply from chin to toes, leave 8 hours, then shower. Repeat x 1 in one week. (Patient not taking: Reported on 12/28/2015) 60 g 1 Not Taking at Unknown time  . spironolactone (ALDACTONE) 25 MG tablet Take 0.5 tablets (12.5 mg total) by mouth daily. (Patient not taking: Reported on 12/28/2015) 45  tablet 3 Not Taking at Unknown time  . warfarin (COUMADIN) 5 MG tablet 5 mg (1 tab) on Sundays, Tues, Thursdays & Saturdays 7.5 mg (1 & half tabs) on Mon, Wed, Fridays (Patient not taking: Reported on 12/28/2015) 120 tablet 1 Not Taking at Unknown time   Scheduled:  . ARIPiprazole  2 mg Oral BID  . digoxin  0.125 mg Oral Daily  . escitalopram  10 mg Oral Daily  . furosemide  80 mg Oral BID  . insulin aspart  0-15 Units Subcutaneous TID WC  . insulin aspart  0-5 Units Subcutaneous QHS  . metoprolol      . [START ON 01/01/2016] metoprolol succinate  50 mg Oral Daily  . potassium chloride  40 mEq Oral Daily  . sodium chloride flush  3 mL Intravenous Q12H  . warfarin  5 mg Oral ONCE-1800  . Warfarin - Pharmacist Dosing Inpatient   Does not apply q1800   Infusions:  . heparin      Assessment: 65yo male with history of Afib not taking his warfarin PTA presents with edema.  Pharmacy is consulted to dose heparin for atrial fibrillation. Pt reports that he doesn't take any of his home meds including his coumadin.   HL currently therapeutic at 0.35 this am on heparin drip 1300 uts/hr.  Heparin turned off during stress test now being restarted.   Resume warfarin as well - patient refuses to take meds as outpatient  - will discuss need to restart [facility] during inpt stay. CBC stable, no s/sx bleeding noted.   Goal of Therapy:  INR 2-3 Heparin level 0.3-0.7 units/ml Monitor platelets by anticoagulation protocol: Yes   Plan:  -Restart heparin drip 1300 uts/hr Warfarin  x1 -Daily CBC, HL, Protime -Monitor for s/sx of bleeding.  -follow up need for [facility] given non-compliance  Leota Sauers Pharm.D. CPP, BCPS Clinical Pharmacist [phone] 12/31/2015 2:50 PM

## 2015-12-31 NOTE — Progress Notes (Signed)
    Patient was admitted to Hospital subtherapeutic on Coumadin due to noncompliance. I was originally called by the nurse about patient not being put back on heparin after his stress test. I decided to restart heparin and order Coumadin per pharmacy. I talked to Leota Sauers with pharmacy who explained that the patient is very noncompliant and has some psychiatric issues and is not willing to take Coumadin as an outpatient. I'm not sure the utility of starting Coumadin now while admitted if he will not continue this as an outpatient. I will defer to Dr. Delton See who will round on him today. It may be that he has not a long-term candidate for anticoagulation due to noncompliance and psychiatric illness.   Cline Crock PA-C  [facility]

## 2015-12-31 NOTE — Care Management Important Message (Signed)
Important Message  Patient Details  Name: [name] MRN: [id number] Date of Birth: 1951/01/25   Medicare Important Message Given:  Yes    Kanija Remmel P Caprisha Bridgett 12/31/2015, 4:30 PM

## 2016-01-01 DIAGNOSIS — E1165 Type 2 diabetes mellitus with hyperglycemia: Secondary | ICD-10-CM

## 2016-01-01 DIAGNOSIS — N189 Chronic kidney disease, unspecified: Secondary | ICD-10-CM

## 2016-01-01 DIAGNOSIS — E1122 Type 2 diabetes mellitus with diabetic chronic kidney disease: Secondary | ICD-10-CM

## 2016-01-01 DIAGNOSIS — R69 Illness, unspecified: Secondary | ICD-10-CM

## 2016-01-01 LAB — CBC
HEMATOCRIT: 40 % (ref 39.0–52.0)
Hemoglobin: 12.5 g/dL — ABNORMAL LOW (ref 13.0–17.0)
MCH: 28.8 pg (ref 26.0–34.0)
MCHC: 31.3 g/dL (ref 30.0–36.0)
MCV: 92.2 fL (ref 78.0–100.0)
Platelets: 132 10*3/uL — ABNORMAL LOW (ref 150–400)
RBC: 4.34 MIL/uL (ref 4.22–5.81)
RDW: 15.1 % (ref 11.5–15.5)
WBC: 6.7 10*3/uL (ref 4.0–10.5)

## 2016-01-01 LAB — BASIC METABOLIC PANEL
ANION GAP: 15 (ref 5–15)
BUN: 20 mg/dL (ref 6–20)
CHLORIDE: 99 mmol/L — AB (ref 101–111)
CO2: 28 mmol/L (ref 22–32)
Calcium: 9.1 mg/dL (ref 8.9–10.3)
Creatinine, Ser: 1.46 mg/dL — ABNORMAL HIGH (ref 0.61–1.24)
GFR calc non Af Amer: 49 mL/min — ABNORMAL LOW (ref >60)
GFR, EST AFRICAN AMERICAN: 56 mL/min — AB (ref >60)
Glucose, Bld: 123 mg/dL — ABNORMAL HIGH (ref 65–99)
POTASSIUM: 3.9 mmol/L (ref 3.5–5.1)
SODIUM: 142 mmol/L (ref 135–145)

## 2016-01-01 LAB — HEPARIN LEVEL (UNFRACTIONATED)
Heparin Unfractionated: 0.11 IU/mL — ABNORMAL LOW (ref 0.30–0.70)
Heparin Unfractionated: 0.49 IU/mL (ref 0.30–0.70)

## 2016-01-01 LAB — GLUCOSE, CAPILLARY
GLUCOSE-CAPILLARY: 114 mg/dL — AB (ref 65–99)
Glucose-Capillary: 111 mg/dL — ABNORMAL HIGH (ref 65–99)
Glucose-Capillary: 141 mg/dL — ABNORMAL HIGH (ref 65–99)
Glucose-Capillary: 129 mg/dL — ABNORMAL HIGH (ref 65–99)

## 2016-01-01 LAB — PROTIME-INR
INR: 1.18 (ref 0.00–1.49)
Prothrombin Time: 15.2 seconds (ref 11.6–15.2)

## 2016-01-01 MED ORDER — WARFARIN SODIUM 7.5 MG PO TABS
7.5000 mg | ORAL_TABLET | Freq: Once | ORAL | Status: AC
  Administered 2016-01-01: 7.5 mg via ORAL
  Filled 2016-01-01: qty 1

## 2016-01-01 NOTE — Progress Notes (Signed)
ANTICOAGULATION CONSULT NOTE   Pharmacy Consult for Heparin  Indication: atrial fibrillation  No Known Allergies  Patient Measurements: Height: 5\' 8"  (172.7 cm) Weight: 207 lb 14.4 oz (94.303 kg) (scale c) IBW/kg (Calculated) : 68.4 Heparin Dosing Weight: 91.5 kg  Vital Signs: Temp: 97.4 F (36.3 C) (02/22 1111) Temp Source: Oral (02/22 1111) BP: 130/87 mmHg (02/22 1111) Pulse Rate: 123 (02/22 1111)  Labs:  Recent Labs  12/30/15 0635 12/30/15 1611 12/31/15 0340 01/01/16 0733 01/01/16 1640  HGB 11.9*  --  12.0* 12.5*  --   HCT 37.1*  --  37.8* 40.0  --   PLT 124*  --  148* 132*  --   LABPROT  --   --   --  15.2  --   INR  --   --   --  1.18  --   HEPARINUNFRC 0.38  --  0.35 0.11* 0.49  CREATININE 1.62*  --  1.66* 1.46*  --   TROPONINI  --  0.31*  --   --   --     Estimated Creatinine Clearance: 56.2 mL/min (by C-G formula based on Cr of 1.46).   Medical History: Past Medical History  Diagnosis Date  . A-fib (HCC)     a. (06/04/14) TEE-DC-CV; succesful; large LA 6.2 cm  . Depression   . Type 2 diabetes mellitus (HCC)   . CKD (chronic kidney disease) stage 3, GFR 30-59 ml/min   . OSA (obstructive sleep apnea)   . Chronic systolic heart failure (HCC)     a. ECHO (05/2014): EF 25-30%, diff HK, RV midly dilated and sys fx mildly/mod reduced  . HTN (hypertension)     Medications:   Scheduled:  . ARIPiprazole  2 mg Oral BID  . digoxin  0.125 mg Oral Daily  . escitalopram  10 mg Oral Daily  . furosemide  80 mg Oral BID  . insulin aspart  0-15 Units Subcutaneous TID WC  . insulin aspart  0-5 Units Subcutaneous QHS  . losartan  25 mg Oral Daily  . metoprolol succinate  50 mg Oral Daily  . potassium chloride  40 mEq Oral Daily  . sodium chloride flush  3 mL Intravenous Q12H  . warfarin  7.5 mg Oral ONCE-1800  . Warfarin - Pharmacist Dosing Inpatient   Does not apply q1800   Infusions:  . heparin 1,600 Units/hr (01/01/16 1042)    Assessment: 65yo male  with history of Afib not taking his warfarin PTA presents with edema. Pharmacy is consulted to dose heparin for atrial fibrillation. Pt reports that he doesn't take any of his home meds including his coumadin.   Heparin level low this AM at 0.11>>PM HL 0.49 in goal CBC stable  INR = 1.18   Goal of Therapy:  INR 2-3 Heparin level 0.3-0.7 units/ml Monitor platelets by anticoagulation protocol: Yes   Plan:  Heparin to 1600 units/hr Daily HL and CBC   Damoney Julia S. Merilynn Finland, PharmD, [facility] Clinical Staff Pharmacist Pager [phone]  01/01/2016 5:24 PM

## 2016-01-01 NOTE — Progress Notes (Signed)
Pt a/o, refused Abilify, VSS, pt stable

## 2016-01-01 NOTE — Progress Notes (Signed)
ANTICOAGULATION CONSULT NOTE   Pharmacy Consult for Heparin / Warfarin Indication: atrial fibrillation  No Known Allergies  Patient Measurements: Height:  (172.7 cm) Weight: 207 lb 14.4 oz (94.303 kg) (scale c) IBW/kg (Calculated) : 68.4 Heparin Dosing Weight: 91.5 kg  Vital Signs: Temp: 97.8 F (36.6 C) (02/22 0726) Temp Source: Oral (02/22 0726) BP: 141/98 mmHg (02/22 0726) Pulse Rate: 124 (02/22 0726)  Labs:  Recent Labs  12/30/15 0635 12/30/15 1611 12/31/15 0340 01/01/16 0733  HGB 11.9*  --  12.0* 12.5*  HCT 37.1*  --  37.8* 40.0  PLT 124*  --  148* 132*  LABPROT  --   --   --  15.2  INR  --   --   --  1.18  HEPARINUNFRC 0.38  --  0.35 0.11*  CREATININE 1.62*  --  1.66* 1.46*  TROPONINI  --  0.31*  --   --     Estimated Creatinine Clearance: 56.2 mL/min (by C-G formula based on Cr of 1.46).   Medical History: Past Medical History  Diagnosis Date  . A-fib (HCC)     a. (06/04/14) TEE-DC-CV; succesful; large LA 6.2 cm  . Depression   . Type 2 diabetes mellitus (HCC)   . CKD (chronic kidney disease) stage 3, GFR 30-59 ml/min   . OSA (obstructive sleep apnea)   . Chronic systolic heart failure (HCC)     a. ECHO (05/2014): EF 25-30%, diff HK, RV midly dilated and sys fx mildly/mod reduced  . HTN (hypertension)     Medications:  Prescriptions prior to admission  Medication Sig Dispense Refill Last Dose  . carvedilol (COREG) 12.5 MG tablet Take 1 tablet (12.5 mg total) by mouth 2 (two) times daily with a meal. (Patient not taking: Reported on 12/28/2015) 60 tablet 3 Not Taking at Unknown time  . digoxin (LANOXIN) 0.125 MG tablet Take 1 tablet (0.125 mg total) by mouth daily. (Patient not taking: Reported on 12/28/2015) 30 tablet 3 Not Taking at Unknown time  . furosemide (LASIX) 40 MG tablet Take 40 mg (1 tablet) in the morning and 20 mg (1/2 tablet) in the evening. (Patient not taking: Reported on 12/28/2015) 45 tablet 3 Not Taking at Unknown time  .  hydrOXYzine (ATARAX/VISTARIL) 25 MG tablet Take 1 tablet (25 mg total) by mouth every 8 (eight) hours as needed for itching. (Patient not taking: Reported on 12/28/2015) 30 tablet 0 Not Taking at Unknown time  . lisinopril (PRINIVIL,ZESTRIL) 2.5 MG tablet Take 1 tablet (2.5 mg total) by mouth 2 (two) times daily. (Patient not taking: Reported on 12/28/2015) 60 tablet 3 Not Taking at Unknown time  . metFORMIN (GLUCOPHAGE) 500 MG tablet Take 1 tablet (500 mg total) by mouth daily with breakfast. (Patient not taking: Reported on 12/28/2015) 60 tablet 0 Not Taking at Unknown time  . permethrin (ELIMITE) 5 % cream Apply from chin to toes, leave 8 hours, then shower. Repeat x 1 in one week. (Patient not taking: Reported on 12/28/2015) 60 g 1 Not Taking at Unknown time  . spironolactone (ALDACTONE) 25 MG tablet Take 0.5 tablets (12.5 mg total) by mouth daily. (Patient not taking: Reported on 12/28/2015) 45 tablet 3 Not Taking at Unknown time  . warfarin (COUMADIN) 5 MG tablet 5 mg (1 tab) on Sundays, Tues, Thursdays & Saturdays 7.5 mg (1 & half tabs) on Mon, Wed, Fridays (Patient not taking: Reported on 12/28/2015) 120 tablet 1 Not Taking at Unknown time   Scheduled:  . ARIPiprazole  2 mg Oral BID  . digoxin  0.125 mg Oral Daily  . escitalopram  10 mg Oral Daily  . furosemide  80 mg Oral BID  . insulin aspart  0-15 Units Subcutaneous TID WC  . insulin aspart  0-5 Units Subcutaneous QHS  . losartan  25 mg Oral Daily  . metoprolol succinate  50 mg Oral Daily  . potassium chloride  40 mEq Oral Daily  . sodium chloride flush  3 mL Intravenous Q12H  . warfarin  5 mg Oral ONCE-1800  . Warfarin - Pharmacist Dosing Inpatient   Does not apply q1800   Infusions:  . heparin 1,300 Units/hr (01/01/16 0315)    Assessment: 65yo male with history of Afib not taking his warfarin PTA presents with edema. Pharmacy is consulted to dose heparin for atrial fibrillation. Pt reports that he doesn't take any of his home meds  including his coumadin.   Heparin level low this AM at 0.11 CBC stable  INR = 1.18   Goal of Therapy:  INR 2-3 Heparin level 0.3-0.7 units/ml Monitor platelets by anticoagulation protocol: Yes   Plan:  Heparin to 1600 units / hr, 6 hr heparin level Warfarin 7.5mg  x1 Daily CBC, HL, Protime Monitor for s/sx of bleeding.  Follow up need for [facility] given non-compliance  Thank you Okey Regal, PharmD [phone]  01/01/2016 10:10 AM

## 2016-01-01 NOTE — Progress Notes (Signed)
Subjective: No CP, SOB, orthopnea.   Objective: Vital signs in last 24 hours: Temp:  [97.8 F (36.6 C)] 97.8 F (36.6 C) (02/22 0726) Pulse Rate:  [121-124] 124 (02/22 0726) Resp:  [18] 18 (02/22 0726) BP: (135-159)/(97-103) 141/98 mmHg (02/22 0726) SpO2:  [97 %-98 %] 98 % (02/22 0726) Weight:  [207 lb 14.4 oz (94.303 kg)] 207 lb 14.4 oz (94.303 kg) (02/22 0726) Last BM Date: 12/30/15  Intake/Output from previous day: 02/21 0701 - 02/22 0700 In: 480 [P.O.:480] Out: 1150 [Urine:1150] Intake/Output this shift: Total I/O In: 340 [P.O.:340] Out: 125 [Urine:125]  Medications Scheduled Meds: . ARIPiprazole  2 mg Oral BID  . digoxin  0.125 mg Oral Daily  . escitalopram  10 mg Oral Daily  . furosemide  80 mg Oral BID  . insulin aspart  0-15 Units Subcutaneous TID WC  . insulin aspart  0-5 Units Subcutaneous QHS  . losartan  25 mg Oral Daily  . metoprolol succinate  50 mg Oral Daily  . potassium chloride  40 mEq Oral Daily  . sodium chloride flush  3 mL Intravenous Q12H  . warfarin  7.5 mg Oral ONCE-1800  . Warfarin - Pharmacist Dosing Inpatient   Does not apply q1800   Continuous Infusions: . heparin 1,300 Units/hr (01/01/16 0315)   PRN Meds:.sodium chloride, acetaminophen, hydrOXYzine, ondansetron (ZOFRAN) IV, sodium chloride flush  PE: General appearance: alert, cooperative and no distress Lungs: clear to auscultation bilaterally Heart: Reg rhythm.  Rate fast.  No MRG Abdomen: +BS, nontender Extremities: 1+ LEE Pulses: 2+ and symmetric Skin: Warm and dry Neurologic: grossly normal  Lab Results:   Recent Labs  12/30/15 0635 12/31/15 0340 01/01/16 0733  WBC 5.3 6.0 6.7  HGB 11.9* 12.0* 12.5*  HCT 37.1* 37.8* 40.0  PLT 124* 148* 132*   BMET  Recent Labs  12/30/15 0635 12/31/15 0340 01/01/16 0733  NA 140 143 142  K 3.0* 3.3* 3.9  CL 102 98* 99*  CO2 28 30 28   GLUCOSE 132* 108* 123*  BUN 28* 27* 20  CREATININE 1.62* 1.66* 1.46*  CALCIUM 8.7*  8.8* 9.1   PT/INR  Recent Labs  01/01/16 0733  LABPROT 15.2  INR 1.18    Assessment/Plan  1. Acute on chronic systolic HF -Net fluids:  -10.3L. Weight down from 232 --> 207 lbs. SCr stable. 1.46 today -Echo 05/09/2014 EF 25-30%, moderate LVH, peak PA pressure -New echo:  EF 20-25%, diffuse hypokinesis.  There is akinesis of the basal-mid anteroseptal and apical anteriormyocardium. There is akinesis of the basalinferior myocardium.  LA severely dilated. PA pressure .  - He was felt to be close to euvolemic yesterday and transition to PO Lasix 80 mg BID (previously 40 mg in the am and 20 mg in the pm. )   - Continue Toprol-XL 50 mg daily as well as losartan 25 mg daily.  2. aflutter with RVR: He remains in atrial flutter with heart rates in the 120s to 130s - CHA2DS2-Vasc score 4 (HTN, DM, age, HF) - INR subtherapeutic due to noncompliance. Current on IV heparin given elevated trop, restarted on Coumadin yesterday. Managed by pharmacy.  -Continue Toprol-XL 50 mg daily - Plan was for possible TEE/DCCV prior to discharge. However I'm not convinced that he will continue taking his Coumadin as prescribed due to his mental illness and previous statement saying that he doesn't like to take his medicines as an outpatient   3. Elevated troponin - Trop 0.27 --> 0.48 consistent  with demand ischemia in the setting of acute CHF and atrial flutter with RVR - Negative stress test yesterday with old scar and no ischemia, LVEF 20%  4. Acute on chronic renal insufficiency -baseline Cr 1.1 in 05/2014, now Cr 1.6-1.7, unclear how much of this is acute on chronic. Likely related to noncompliance with medication. Creatinine is 1.46 today.  5. OSA 6. DM 7. H/o afib 8. Psychosocial issue: Per hospital team. His chronic management will not be successful without these issues being addressed. Psychiatry saw during this  admission but felt like he did not meet criteria for inpatient psych. He started him on some medications for depression as well as his bizarre thought processes   Janetta Hora 01/01/2016   The patient was seen, examined and discussed with Carlean Jews, PA-C and I agree with the above.   Negative stress test with old scar and no ischemia, LVEF 20%, he is almost euvolemic, we switched to PO diuretics 80 mg PO BID, previously 40 mg in the am and 20 mg in the pm.  Crea is stable. INR 1.2 today, started on coumadin on 2/21, on heparin drip for bridging. He remains in a-flutter, he is scheduled for TEE/DCCV on Friday 2/24. We started losartan, monitor his crea closely.  Lars Masson 01/01/2016

## 2016-01-01 NOTE — Progress Notes (Signed)
[facility]                                                                              Patient Demographics  [name], is a 65 y.o. male, DOB - September 01, 1951, YNW:[id number]  Admit date - 12/28/2015   Admitting Physician Calvert Cantor, MD  Outpatient Primary MD for the patient is [name], MD  LOS - 4   Chief Complaint  Patient presents with  . Leg Swelling      HPI on 12/28/2015 by [name], NP [name] is a 65 y.o. male with a past medical history that includes type 2 diabetes uncontrolled, chronic kidney disease stage III, obesity, lower extremity edema, hypertension, chronic systolic heart failure, obstructive sleep apnea A. fib noncompliant with Coumadin presents to the emergency department with chief complaint of worsening lower extremity edema. Initial evaluation reveals acute on chronic systolic heart failure with an elevated troponin.  Information is obtained from the patient. He reports noncompliance with medication and follow-up medical care since 2015. He states he came to the emergency department when his legs swelled to the point that it caused him pain and made it difficult to walk. He denies any chest pain palpitations headache dizziness syncope or near-syncope. He denies orthopnea cough nausea vomiting. He denies dysuria hematuria frequency or urgency. 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 and septum, no evidence of ischemia, severe generalized hypokinesia with EF of 20% -Continue diuresis, patient was placed on 80 of Lasix twice a day -Continue metoprolol, losartan  Atrial fibrillation with RVR -Patient  had TEE DC CV July 2015, has been on Coumadin past however was not compliant -Continue digoxin, metoprolol -Continue Coumadin and heparin -Plan for TEE and DC CV 2.4 2017  Elevated troponin -Likely secondary to the above -Peak 0.48, consistent with demand ischemia in the setting of CHF and he flutter with RVR  Essential hypertension -Continue Lasix -Patient has been noncompliant, was supposed to be on Coreg, Lasix, lisinopril however has not taken these medications in over a year  Acute on chronic kidney disease, stage III -Creatinine 1.6 on admission, currently 1.46 -Baseline creatinine 1.1 -Continue to monitor BMP  Depression/psychosocial issues/noncompliance -Psychiatry consulted and appreciated, recommended Lexapro and Abilify -Patient refuses Abilify  Diabetes mellitus, type II -Hemoglobin A1c 6 -Metformin held -Continue insulin sliding scale a CBG monitoring  Code Status: Full  Family Communication: None at bedside.  Disposition Plan: Admitted. Pending DCCV/TEE on 2/24.   Time Spent in minutes   30 minutes  Procedures  Echocardiogram Stress test  Consults   Cardiology Psychiatry  DVT Prophylaxis  Heparin/Couamdin  Lab Results  Component Value Date   PLT 132* 01/01/2016    Medications  Scheduled Meds: . ARIPiprazole  2 mg Oral BID  . digoxin  0.125 mg Oral Daily  . escitalopram  10 mg Oral Daily  . furosemide  80 mg Oral BID  .  insulin aspart  0-15 Units Subcutaneous TID WC  . insulin aspart  0-5 Units Subcutaneous QHS  . losartan  25 mg Oral Daily  . metoprolol succinate  50 mg Oral Daily  . potassium chloride  40 mEq Oral Daily  . sodium chloride flush  3 mL Intravenous Q12H  . warfarin  7.5 mg Oral ONCE-1800  . Warfarin - Pharmacist Dosing Inpatient   Does not apply q1800   Continuous Infusions: . heparin 1,600 Units/hr (01/01/16 1042)   PRN Meds:.sodium chloride, acetaminophen, hydrOXYzine, ondansetron (ZOFRAN) IV, sodium chloride  flush  Antibiotics    Anti-infectives    None      Subjective:   [name] seen and examined today.  Patient states his breathing is better. Denies any chest pain, abdominal pain, nausea vomiting, dizziness or headache.  Objective:   Filed Vitals:   12/31/15 1229 12/31/15 2017 01/01/16 0726 01/01/16 1111  BP: 145/98 135/97 141/98 130/87  Pulse: 121 121 124 123  Temp: 97.8 F (36.6 C) 97.8 F (36.6 C) 97.8 F (36.6 C) 97.4 F (36.3 C)  TempSrc: Oral Oral Oral Oral  Resp: Height:      Weight:   94.303 kg (207 lb 14.4 oz)   SpO2: 98% 97% 98% 97%    Wt Readings from Last 3 Encounters:  01/01/16 94.303 kg (207 lb 14.4 oz)  06/19/14 96.333 kg (212 lb 6 oz)  05/21/14 103.874 kg (229 lb)     Intake/Output Summary (Last 24 hours) at 01/01/16 1425 Last data filed at 01/01/16 1351  Gross per 24 hour  Intake    820 ml  Output   1175 ml  Net   -355 ml    Exam  General: Well developed, well nourished, NAD, appears stated age  HEENT: NCAT,  mucous membranes moist.   Cardiovascular: S1 S2 auscultated, tachycaridc.  Respiratory: Clear to auscultation bilaterally with equal chest rise  Abdomen: Soft, nontender, nondistended, + bowel sounds  Extremities: warm dry without cyanosis clubbing. +LE edema B/L  Neuro: AAOx3, nonfocal  Skin: Without rashes exudates or nodules  Psych: Appropriate mood, makes strange comments  Data Review   Micro Results No results found for this or any previous visit (from the past 240 hour(s)).  Radiology Reports Dg Chest 2 View  12/28/2015  CLINICAL DATA:  Acute chest pain shortness of breath, diabetes, hypertension EXAM: CHEST  2 VIEW COMPARISON:  05/08/2014 FINDINGS: Stable cardiomegaly with slight vascular congestion. Minor streaky bibasilar densities and trace pleural effusions. No definite focal pneumonia. No current CHF or pneumothorax. Trachea midline. Degenerative changes of the spine. IMPRESSION: cardiomegaly  with trace pleural effusions and bibasilar atelectasis. Electronically Signed   By: Judie Petit.  Shick M.D.   On: 12/28/2015 12:45   Nm Myocar Multi W/spect W/wall Motion / Ef  12/31/2015  CLINICAL DATA:  Atrial fibrillation, CHF.  Diabetes, hypertension. EXAM: MYOCARDIAL IMAGING WITH SPECT (REST AND PHARMACOLOGIC-STRESS) GATED LEFT VENTRICULAR WALL MOTION STUDY LEFT VENTRICULAR EJECTION FRACTION TECHNIQUE: Standard myocardial SPECT imaging was performed after resting intravenous injection of 10 mCi Tc-27m sestamibi. Subsequently, intravenous infusion of Lexiscan was performed under the supervision of the Cardiology staff. At peak effect of the drug, 30 mCi Tc-22m sestamibi was injected intravenously and standard myocardial SPECT imaging was performed. Quantitative gated imaging was also performed to evaluate left ventricular wall motion, and estimate left ventricular ejection fraction. COMPARISON:  None. FINDINGS: Perfusion: Next defects noted on rest and stress images within the inferior wall and to  lesser extent the septum. No reversible defects. Wall Motion: Severe generalized hypokinesia. Left ventricle is dilute Left Ventricular Ejection Fraction: 20 % End diastolic volume 218 ml End systolic volume 173 ml IMPRESSION: 1. 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 No evidence of ischemia. 2. Severe generalized hypokinesia with dilated left ventricle. 3. Left ventricular ejection fraction 20% 4. High-risk stress test findings*. *2012 Appropriate Use Criteria for Coronary Revascularization Focused Update: J Am Coll Cardiol. 2012;59(9):857-881. http://content.dementiazones.com.aspx?articleid=[email or web address] Electronically Signed   By: Charlett Nose M.D.   On: 12/31/2015 14:09    CBC  Recent Labs Lab 12/28/15 1219 12/29/15 0430 12/30/15 0635 12/31/15 0340 01/01/16 0733  WBC 7.3 6.9 5.3 6.0 6.7  HGB 12.5* 12.3* 11.9* 12.0* 12.5*  HCT 40.1 40.2 37.1* 37.8* 40.0  PLT 183 178 124* 148*  132*  MCV 89.7 89.5 89.8 90.6 92.2  MCH 28.0 27.4 28.8 28.8 28.8  MCHC 31.2 30.6 32.1 31.7 31.3  RDW 14.9 14.8 15.1 15.0 15.1    Chemistries   Recent Labs Lab 12/28/15 1219 12/29/15 0430 12/30/15 0635 12/31/15 0340 01/01/16 0733  NA 139 143 140 143 142  K 4.2 4.0 3.0* 3.3* 3.9  CL 103 104 102 98* 99*  CO2 24 26 28 30 28   GLUCOSE 162* 87 132* 108* 123*  BUN 31* 32* 28* 27* 20  CREATININE 1.65* 1.71* 1.62* 1.66* 1.46*  CALCIUM 9.1 9.1 8.7* 8.8* 9.1   ------------------------------------------------------------------------------------------------------------------ estimated creatinine clearance is 56.2 mL/min (by C-G formula based on Cr of 1.46). ------------------------------------------------------------------------------------------------------------------ No results for input(s): HGBA1C in the last 72 hours. ------------------------------------------------------------------------------------------------------------------ No results for input(s): CHOL, HDL, LDLCALC, TRIG, CHOLHDL, LDLDIRECT in the last 72 hours. ------------------------------------------------------------------------------------------------------------------ No results for input(s): TSH, T4TOTAL, T3FREE, THYROIDAB in the last 72 hours.  Invalid input(s): FREET3 ------------------------------------------------------------------------------------------------------------------ No results for input(s): VITAMINB12, FOLATE, FERRITIN, TIBC, IRON, RETICCTPCT in the last 72 hours.  Coagulation profile  Recent Labs Lab 12/29/15 0430 01/01/16 0733  INR 1.45 1.18    No results for input(s): DDIMER in the last 72 hours.  Cardiac Enzymes  Recent Labs Lab 12/28/15 1947 12/29/15 0430 12/30/15 1611  TROPONINI 0.27* 0.48* 0.31*   ------------------------------------------------------------------------------------------------------------------ Invalid input(s): POCBNP    Charlotte Brafford D.O. on 01/01/2016  at 2:25 PM  Between 7am to 7pm - Pager - [phone]  After 7pm go to [email or web address] - password [email or web address]  And look for the night coverage person covering for me after hours  [facility] Office  [phone]

## 2016-01-01 NOTE — Progress Notes (Signed)
No apparent distress . Heparin drip in progress @1600  units /hr via pump

## 2016-01-02 LAB — BASIC METABOLIC PANEL
Anion gap: 10 (ref 5–15)
BUN: 25 mg/dL — AB (ref 6–20)
CHLORIDE: 100 mmol/L — AB (ref 101–111)
CO2: 29 mmol/L (ref 22–32)
CREATININE: 1.45 mg/dL — AB (ref 0.61–1.24)
Calcium: 8.9 mg/dL (ref 8.9–10.3)
GFR calc Af Amer: 57 mL/min — ABNORMAL LOW (ref >60)
GFR calc non Af Amer: 49 mL/min — ABNORMAL LOW (ref >60)
GLUCOSE: 111 mg/dL — AB (ref 65–99)
Potassium: 3.7 mmol/L (ref 3.5–5.1)
Sodium: 139 mmol/L (ref 135–145)

## 2016-01-02 LAB — CBC
HCT: 39.1 % (ref 39.0–52.0)
Hemoglobin: 12.3 g/dL — ABNORMAL LOW (ref 13.0–17.0)
MCH: 28.9 pg (ref 26.0–34.0)
MCHC: 31.5 g/dL (ref 30.0–36.0)
MCV: 92 fL (ref 78.0–100.0)
Platelets: 141 10*3/uL — ABNORMAL LOW (ref 150–400)
RBC: 4.25 MIL/uL (ref 4.22–5.81)
RDW: 15.3 % (ref 11.5–15.5)
WBC: 6.2 10*3/uL (ref 4.0–10.5)

## 2016-01-02 LAB — GLUCOSE, CAPILLARY
GLUCOSE-CAPILLARY: 106 mg/dL — AB (ref 65–99)
GLUCOSE-CAPILLARY: 141 mg/dL — AB (ref 65–99)
GLUCOSE-CAPILLARY: 102 mg/dL — AB (ref 65–99)
Glucose-Capillary: 129 mg/dL — ABNORMAL HIGH (ref 65–99)

## 2016-01-02 LAB — HEPARIN LEVEL (UNFRACTIONATED): Heparin Unfractionated: 0.45 IU/mL (ref 0.30–0.70)

## 2016-01-02 LAB — PROTIME-INR
INR: 1.26 (ref 0.00–1.49)
Prothrombin Time: 16 seconds — ABNORMAL HIGH (ref 11.6–15.2)

## 2016-01-02 MED ORDER — WARFARIN SODIUM 7.5 MG PO TABS
7.5000 mg | ORAL_TABLET | Freq: Once | ORAL | Status: AC
  Administered 2016-01-02: 7.5 mg via ORAL
  Filled 2016-01-02: qty 1

## 2016-01-02 NOTE — Progress Notes (Signed)
Subjective: No CP, SOB, orthopnea.   Objective: Vital signs in last 24 hours: Temp:  [97.4 F (36.3 C)-97.6 F (36.4 C)] 97.4 F (36.3 C) (02/23 0523) Pulse Rate:  [122-123] 122 (02/23 0523) Resp:  [18-20] 20 (02/23 0523) BP: (128-138)/(87-100) 138/100 mmHg (02/23 0523) SpO2:  [93 %-97 %] 93 % (02/23 0523) Weight:  [207 lb 8 oz (94.121 kg)] 207 lb 8 oz (94.121 kg) (02/23 0523) Last BM Date: 01/01/16  Intake/Output from previous day: 02/22 0701 - 02/23 0700 In: 1056 [P.O.:1056] Out: 1725 [Urine:1725] Intake/Output this shift: Total I/O In: 360 [P.O.:360] Out: 300 [Urine:300]  Medications Scheduled Meds: . ARIPiprazole  2 mg Oral BID  . digoxin  0.125 mg Oral Daily  . escitalopram  10 mg Oral Daily  . furosemide  80 mg Oral BID  . insulin aspart  0-15 Units Subcutaneous TID WC  . insulin aspart  0-5 Units Subcutaneous QHS  . losartan  25 mg Oral Daily  . metoprolol succinate  50 mg Oral Daily  . potassium chloride  40 mEq Oral Daily  . sodium chloride flush  3 mL Intravenous Q12H  . warfarin  7.5 mg Oral ONCE-1800  . Warfarin - Pharmacist Dosing Inpatient   Does not apply q1800   Continuous Infusions: . heparin 1,600 Units/hr (01/01/16 2222)   PRN Meds:.sodium chloride, acetaminophen, hydrOXYzine, ondansetron (ZOFRAN) IV, sodium chloride flush  PE: General appearance: alert, cooperative and no distress Lungs: clear to auscultation bilaterally Heart: Reg rhythm.  Rate fast.  No MRG Abdomen: +BS, nontender Extremities: 1+ LEE Pulses: 2+ and symmetric Skin: Warm and dry Neurologic: grossly normal  Lab Results:   Recent Labs  12/31/15 0340 01/01/16 0733 01/02/16 0513  WBC 6.0 6.7 6.2  HGB 12.0* 12.5* 12.3*  HCT 37.8* 40.0 39.1  PLT 148* 132* 141*   BMET  Recent Labs  12/31/15 0340 01/01/16 0733 01/02/16 0513  NA 143 142 139  K 3.3* 3.9 3.7  CL 98* 99* 100*  CO2 30 28 29   GLUCOSE 108* 123* 111*  BUN 27* 20 25*  CREATININE 1.66* 1.46*  1.45*  CALCIUM 8.8* 9.1 8.9   PT/INR  Recent Labs  01/01/16 0733 01/02/16 0513  LABPROT 15.2 16.0*  INR 1.18 1.26    Assessment/Plan  1. Acute on chronic systolic HF -Net fluids:  -10.3L. Weight down from 232 --> 207 lbs. SCr stable. 1.46 today -Echo 05/09/2014 EF 25-30%, moderate LVH, peak PA pressure -New echo:  EF 20-25%, diffuse hypokinesis.  There is akinesis of the basal-mid anteroseptal and apical anteriormyocardium. There is akinesis of the basalinferior myocardium.  LA severely dilated. PA pressure .  - He was felt to be close to euvolemic yesterday and transition to PO Lasix 80 mg BID (previously 40 mg in the am and 20 mg in the pm. )   - Continue Toprol-XL 50 mg daily as well as losartan 25 mg daily.  2. aflutter with RVR: He remains in atrial flutter with heart rates in the 120s to 130s - CHA2DS2-Vasc score 4 (HTN, DM, age, HF) - INR subtherapeutic due to noncompliance. Current on IV heparin given elevated trop, restarted on Coumadin yesterday. Managed by pharmacy.  -Continue Toprol-XL 50 mg daily - Plan was for possible TEE/DCCV prior to discharge. However I'm not convinced that he will continue taking his Coumadin as prescribed due to his mental illness and previous statement saying that he doesn't like to take his medicines as an outpatient   3. Elevated  troponin - Trop 0.27 --> 0.48 consistent with demand ischemia in the setting of acute CHF and atrial flutter with RVR - Negative stress test yesterday with old scar and no ischemia, LVEF 20%  4. Acute on chronic renal insufficiency -baseline Cr 1.1 in 05/2014, now Cr 1.6-1.7, unclear how much of this is acute on chronic. Likely related to noncompliance with medication. Creatinine is 1.46 today.  5. OSA 6. DM 7. H/o afib 8. Psychosocial issue: Per hospital team. His chronic management will not be successful without these issues  being addressed. Psychiatry saw during this admission but felt like he did not meet criteria for inpatient psych. He started him on some medications for depression as well as his bizarre thought processes   Negative stress test with old scar and no ischemia, LVEF 20%, he is almost euvolemic, we will continue PO Lasix 80 mg PO BID, previously 40 mg in the am and 20 mg in the pm.  Crea is stable. INR 1.26 today, started on coumadin on 2/21, on heparin drip for bridging. He remains in a-flutter, he is scheduled for TEE/DCCV on Friday 2/24. We started losartan, crea is stable.  Lars Masson 01/02/2016

## 2016-01-02 NOTE — Progress Notes (Signed)
[facility]                                                                              Patient Demographics  [name], is a 65 y.o. male, DOB - 1951-03-26, ZOX:[id number]  Admit date - 12/28/2015   Admitting Physician Calvert Cantor, MD  Outpatient Primary MD for the patient is [name], MD  LOS - 5   Chief Complaint  Patient presents with  . Leg Swelling      HPI on 12/28/2015 by [name], NP [name] is a 65 y.o. male with a past medical history that includes type 2 diabetes uncontrolled, chronic kidney disease stage III, obesity, lower extremity edema, hypertension, chronic systolic heart failure, obstructive sleep apnea A. fib noncompliant with Coumadin presents to the emergency department with chief complaint of worsening lower extremity edema. Initial evaluation reveals acute on chronic systolic heart failure with an elevated troponin. Information is obtained from the patient. He reports noncompliance with medication and follow-up medical care since 2015. He states he came to the emergency department when his legs swelled to the point that it caused him pain and made it difficult to walk. He denies any chest pain palpitations headache dizziness syncope or near-syncope. He denies orthopnea cough nausea vomiting. He denies dysuria hematuria frequency or urgency. 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, no evidence of ischemia, severe generalized hypokinesia with EF of 20% -Continue diuresis, patient was placed on 80 of Lasix BID -Continue metoprolol, losartan -UOP over past 24 hours  1725cc  Atrial fibrillation with RVR -Patient had TEE DCCV July 2015, has been on Coumadin past however was not compliant -Continue digoxin, metoprolol -Continue Coumadin and heparin -Plan for TEE and DCCV 01/03/2016  Elevated troponin -Likely secondary to the above -Peak 0.48, consistent with demand ischemia in the setting of CHF and he flutter with RVR  Essential hypertension -Continue Lasix -Patient has been noncompliant, was supposed to be on Coreg, Lasix, lisinopril however has not taken these medications in over a year  Acute on chronic kidney disease, stage III -Creatinine 1.6 on admission, currently 1.45 -Baseline creatinine 1.1 -Continue to monitor BMP  Depression/psychosocial issues/noncompliance -Psychiatry consulted and appreciated-did not feel patient was candidate for inpatient psych admission, recommended Lexapro and Abilify -Patient refuses Abilify  Diabetes mellitus, type II -Hemoglobin A1c 6 -Metformin held -Continue insulin sliding scale a CBG monitoring  Code Status: Full  Family Communication: None at bedside.  Disposition Plan: Admitted. Pending DCCV/TEE on 2/24.   Time Spent in minutes   30 minutes  Procedures  Echocardiogram Stress test  Consults   Cardiology Psychiatry  DVT Prophylaxis  Heparin/Couamdin  Lab Results  Component Value Date   PLT 141* 01/02/2016    Medications  Scheduled Meds: . ARIPiprazole  2 mg Oral BID  . digoxin  0.125 mg Oral Daily  . escitalopram  10 mg  Oral Daily  . furosemide  80 mg Oral BID  . insulin aspart  0-15 Units Subcutaneous TID WC  . insulin aspart  0-5 Units Subcutaneous QHS  . losartan  25 mg Oral Daily  . metoprolol succinate  50 mg Oral Daily  . potassium chloride  40 mEq Oral Daily  . sodium chloride flush  3 mL Intravenous Q12H  . warfarin  7.5 mg Oral ONCE-1800  . Warfarin - Pharmacist Dosing Inpatient   Does not apply q1800   Continuous Infusions: . heparin 1,600 Units/hr (01/01/16  2222)   PRN Meds:.sodium chloride, acetaminophen, hydrOXYzine, ondansetron (ZOFRAN) IV, sodium chloride flush  Antibiotics    Anti-infectives    None      Subjective:   [name] seen and examined today.  Patient has no complaints this morning. Denies any chest pain, abdominal pain, nausea vomiting, dizziness or headache.  Feels breathing has improved.  Objective:   Filed Vitals:   01/02/16 0523 01/02/16 1056 01/02/16 1057 01/02/16 1216  BP: 138/100  128/90 127/91  Pulse: 122 122 122 121  Temp: 97.4 F (36.3 C)   97.6 F (36.4 C)  TempSrc: Oral   Oral  Resp: 20   20  Height:      Weight: 94.121 kg (207 lb 8 oz)     SpO2: 93%   96%    Wt Readings from Last 3 Encounters:  01/02/16 94.121 kg (207 lb 8 oz)  06/19/14 96.333 kg (212 lb 6 oz)  05/21/14 103.874 kg (229 lb)     Intake/Output Summary (Last 24 hours) at 01/02/16 1306 Last data filed at 01/02/16 1013  Gross per 24 hour  Intake   1076 ml  Output   1900 ml  Net   -824 ml    Exam  General: Well developed, well nourished, NAD  HEENT: NCAT,  mucous membranes moist.   Cardiovascular: S1 S2 auscultated, IRR  Respiratory: Clear to auscultation bilaterally  Abdomen: Soft, nontender, nondistended, + bowel sounds  Extremities: warm dry without cyanosis clubbing. +LE edema B/L  Neuro: AAOx3, nonfocal   Psych: Appropriate   Data Review   Micro Results No results found for this or any previous visit (from the past 240 hour(s)).  Radiology Reports Dg Chest 2 View  12/28/2015  CLINICAL DATA:  Acute chest pain shortness of breath, diabetes, hypertension EXAM: CHEST  2 VIEW COMPARISON:  05/08/2014 FINDINGS: Stable cardiomegaly with slight vascular congestion. Minor streaky bibasilar densities and trace pleural effusions. No definite focal pneumonia. No current CHF or pneumothorax. Trachea midline. Degenerative changes of the spine. IMPRESSION: cardiomegaly with trace pleural effusions and bibasilar  atelectasis. Electronically Signed   By: Judie Petit.  Shick M.D.   On: 12/28/2015 12:45   Nm Myocar Multi W/spect W/wall Motion / Ef  12/31/2015  CLINICAL DATA:  Atrial fibrillation, CHF.  Diabetes, hypertension. EXAM: MYOCARDIAL IMAGING WITH SPECT (REST AND PHARMACOLOGIC-STRESS) GATED LEFT VENTRICULAR WALL MOTION STUDY LEFT VENTRICULAR EJECTION FRACTION TECHNIQUE: Standard myocardial SPECT imaging was performed after resting intravenous injection of 10 mCi Tc-61m sestamibi. Subsequently, intravenous infusion of Lexiscan was performed under the supervision of the Cardiology staff. At peak effect of the drug, 30 mCi Tc-82m sestamibi was injected intravenously and standard myocardial SPECT imaging was performed. Quantitative gated imaging was also performed to evaluate left ventricular wall motion, and estimate left ventricular ejection fraction. COMPARISON:  None. FINDINGS: Perfusion: Next defects noted on rest and stress images within the inferior wall and to lesser extent the septum. No reversible  defects. Wall Motion: Severe generalized hypokinesia. Left ventricle is dilute Left Ventricular Ejection Fraction: 20 % End diastolic volume 218 ml End systolic volume 173 ml IMPRESSION: 1. 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 No evidence of ischemia. 2. Severe generalized hypokinesia with dilated left ventricle. 3. Left ventricular ejection fraction 20% 4. High-risk stress test findings*. *2012 Appropriate Use Criteria for Coronary Revascularization Focused Update: J Am Coll Cardiol. 2012;59(9):857-881. http://content.dementiazones.com.aspx?articleid=[email or web address] Electronically Signed   By: Charlett Nose M.D.   On: 12/31/2015 14:09    CBC  Recent Labs Lab 12/29/15 0430 12/30/15 0635 12/31/15 0340 01/01/16 0733 01/02/16 0513  WBC 6.9 5.3 6.0 6.7 6.2  HGB 12.3* 11.9* 12.0* 12.5* 12.3*  HCT 40.2 37.1* 37.8* 40.0 39.1  PLT 178 124* 148* 132* 141*  MCV 89.5 89.8 90.6 92.2 92.0  MCH  27.4 28.8 28.8 28.8 28.9  MCHC 30.6 32.1 31.7 31.3 31.5  RDW 14.8 15.1 15.0 15.1 15.3    Chemistries   Recent Labs Lab 12/29/15 0430 12/30/15 0635 12/31/15 0340 01/01/16 0733 01/02/16 0513  NA 143 140 143 142 139  K 4.0 3.0* 3.3* 3.9 3.7  CL 104 102 98* 99* 100*  CO2 26 28 30 28 29   GLUCOSE 87 132* 108* 123* 111*  BUN 32* 28* 27* 20 25*  CREATININE 1.71* 1.62* 1.66* 1.46* 1.45*  CALCIUM 9.1 8.7* 8.8* 9.1 8.9   ------------------------------------------------------------------------------------------------------------------ estimated creatinine clearance is 56.5 mL/min (by C-G formula based on Cr of 1.45). ------------------------------------------------------------------------------------------------------------------ No results for input(s): HGBA1C in the last 72 hours. ------------------------------------------------------------------------------------------------------------------ No results for input(s): CHOL, HDL, LDLCALC, TRIG, CHOLHDL, LDLDIRECT in the last 72 hours. ------------------------------------------------------------------------------------------------------------------ No results for input(s): TSH, T4TOTAL, T3FREE, THYROIDAB in the last 72 hours.  Invalid input(s): FREET3 ------------------------------------------------------------------------------------------------------------------ No results for input(s): VITAMINB12, FOLATE, FERRITIN, TIBC, IRON, RETICCTPCT in the last 72 hours.  Coagulation profile  Recent Labs Lab 12/29/15 0430 01/01/16 0733 01/02/16 0513  INR 1.45 1.18 1.26    No results for input(s): DDIMER in the last 72 hours.  Cardiac Enzymes  Recent Labs Lab 12/28/15 1947 12/29/15 0430 12/30/15 1611  TROPONINI 0.27* 0.48* 0.31*   ------------------------------------------------------------------------------------------------------------------ Invalid input(s): POCBNP    Desirea Mizrahi D.O. on 01/02/2016 at 1:06 PM  Between  7am to 7pm - Pager - [phone]  After 7pm go to [email or web address] - password [email or web address]  And look for the night coverage person covering for me after hours  [facility] Office  [phone]

## 2016-01-02 NOTE — Progress Notes (Signed)
ANTICOAGULATION CONSULT NOTE   Pharmacy Consult for Heparin / Warfarin Indication: atrial fibrillation  No Known Allergies  Patient Measurements: Height: 5\' 8"  (172.7 cm) Weight: 207 lb 8 oz (94.121 kg) (scale c) IBW/kg (Calculated) : 68.4 Heparin Dosing Weight: 91.5 kg  Vital Signs: Temp: 97.4 F (36.3 C) (02/23 0523) Temp Source: Oral (02/23 0523) BP: 138/100 mmHg (02/23 0523) Pulse Rate: 122 (02/23 0523)  Labs:  Recent Labs  12/30/15 1611  12/31/15 0340 01/01/16 0733 01/01/16 1640 01/02/16 0513  HGB  --   < > 12.0* 12.5*  --  12.3*  HCT  --   --  37.8* 40.0  --  39.1  PLT  --   --  148* 132*  --  141*  LABPROT  --   --   --  15.2  --  16.0*  INR  --   --   --  1.18  --  1.26  HEPARINUNFRC  --   < > 0.35 0.11* 0.49 0.45  CREATININE  --   --  1.66* 1.46*  --  1.45*  TROPONINI 0.31*  --   --   --   --   --   < > = values in this interval not displayed.  Estimated Creatinine Clearance: 56.5 mL/min (by C-G formula based on Cr of 1.45).   Assessment: 65yo male with history of Afib not taking his warfarin PTA presents with edema. Pharmacy is consulted to dose heparin for atrial fibrillation. Pt reports that he doesn't take any of his home meds including his coumadin.   Heparin level therapeutic CBC stable  INR = 1.26   Goal of Therapy:  INR 2-3 Heparin level 0.3-0.7 units/ml Monitor platelets by anticoagulation protocol: Yes   Plan:  Heparin at 1600 units / hr Warfarin 7.5mg  x1 Daily CBC, HL, Protime  Thank you Okey Regal, PharmD [phone]  01/02/2016 10:28 AM

## 2016-01-02 NOTE — Evaluation (Signed)
Physical Therapy Evaluation Patient Details Name: [name] MRN: [id number] DOB: 1950/11/26 Today's Date: 01/02/2016   History of Present Illness  65 y.o. male with a past medical history that includes type 2 diabetes uncontrolled, chronic kidney disease stage III, obesity, lower extremity edema, hypertension, chronic systolic heart failure, obstructive sleep apnea A. fib noncompliant with Coumadin presents to the emergency department with chief complaint of worsening lower extremity edema, CHF exacerbation.  Clinical Impression  Patient seen for evaluation, mobilizing independently without need for assist. Patient appears disinterested and disengaged in activity but was agreeable to perform evaluation tasks. Ambulate din hall, HR elevated 130s and performed stair negotiation without issue. No further acute PT needs at this time. Will sign off.    Follow Up Recommendations No PT follow up    Equipment Recommendations  None recommended by PT    Recommendations for Other Services       Precautions / Restrictions        Mobility  Bed Mobility Overal bed mobility: Independent                Transfers Overall transfer level: Independent Equipment used: None                Ambulation/Gait Ambulation/Gait assistance: Independent Ambulation Distance (Feet): 180 Feet Assistive device: None Gait Pattern/deviations: WFL(Within Functional Limits) Gait velocity: decreased Gait velocity interpretation: Below normal speed for age/gender General Gait Details: modest instability but no physical assist or cues needed  Stairs Stairs: Yes Stairs assistance: Modified independent (Device/Increase time) Stair Management: One rail Right Number of Stairs: 3 General stair comments: no physical assist required  Wheelchair Mobility    Modified Rankin (Stroke Patients Only)       Balance Overall balance assessment: No apparent balance deficits (not formally  assessed)                                           Pertinent Vitals/Pain Pain Assessment: No/denies pain    Home Living Family/patient expects to be discharged to:: Unsure Living Arrangements: Non-relatives/Friends (neighbor/friend that cooks for him)               Additional Comments: patient describes that he is staying in a house that is not his residence.     Prior Function Level of Independence: Independent               Hand Dominance   Dominant Hand: Right    Extremity/Trunk Assessment   Upper Extremity Assessment: Overall WFL for tasks assessed           Lower Extremity Assessment: Overall WFL for tasks assessed (increased LE edema noted, but strength WFL)         Communication   Communication: No difficulties  Cognition Arousal/Alertness: Awake/alert Behavior During Therapy: Flat affect;Agitated Overall Cognitive Status: No family/caregiver present to determine baseline cognitive functioning                      General Comments General comments (skin integrity, edema, etc.): patient with bilateral LE edema    Exercises        Assessment/Plan    PT Assessment Patent does not need any further PT services  PT Diagnosis Difficulty walking   PT Problem List    PT Treatment Interventions     PT Goals (Current goals can be found in the  Care Plan section) Acute Rehab PT Goals Patient Stated Goal: none stated PT Goal Formulation: All assessment and education complete, DC therapy    Frequency     Barriers to discharge        Co-evaluation               End of Session Equipment Utilized During Treatment: Gait belt Activity Tolerance: Patient tolerated treatment well Patient left: in bed;with call bell/phone within reach (sitting EOB)           Time: 7048-8891 PT Time Calculation (min) (ACUTE ONLY): 16 min   Charges:   PT Evaluation $PT Eval Low Complexity: 1 Procedure     PT G CodesFabio Asa 01/02/2016, 3:59 PM Charlotte Crumb, PT DPT  [phone]

## 2016-01-03 LAB — PROTIME-INR
INR: 1.25 (ref 0.00–1.49)
PROTHROMBIN TIME: 15.9 s — AB (ref 11.6–15.2)

## 2016-01-03 LAB — BASIC METABOLIC PANEL
ANION GAP: 9 (ref 5–15)
BUN: 29 mg/dL — ABNORMAL HIGH (ref 6–20)
CALCIUM: 9 mg/dL (ref 8.9–10.3)
CO2: 27 mmol/L (ref 22–32)
Chloride: 102 mmol/L (ref 101–111)
Creatinine, Ser: 1.45 mg/dL — ABNORMAL HIGH (ref 0.61–1.24)
GFR, EST AFRICAN AMERICAN: 57 mL/min — AB (ref >60)
GFR, EST NON AFRICAN AMERICAN: 49 mL/min — AB (ref >60)
Glucose, Bld: 100 mg/dL — ABNORMAL HIGH (ref 65–99)
Potassium: 3.8 mmol/L (ref 3.5–5.1)
SODIUM: 138 mmol/L (ref 135–145)

## 2016-01-03 LAB — CBC
HCT: 39.4 % (ref 39.0–52.0)
HEMOGLOBIN: 12.4 g/dL — AB (ref 13.0–17.0)
MCH: 29 pg (ref 26.0–34.0)
MCHC: 31.5 g/dL (ref 30.0–36.0)
MCV: 92.1 fL (ref 78.0–100.0)
PLATELETS: 128 10*3/uL — AB (ref 150–400)
RBC: 4.28 MIL/uL (ref 4.22–5.81)
RDW: 15.2 % (ref 11.5–15.5)
WBC: 7 10*3/uL (ref 4.0–10.5)

## 2016-01-03 LAB — GLUCOSE, CAPILLARY
GLUCOSE-CAPILLARY: 108 mg/dL — AB (ref 65–99)
GLUCOSE-CAPILLARY: 122 mg/dL — AB (ref 65–99)
GLUCOSE-CAPILLARY: 107 mg/dL — AB (ref 65–99)
Glucose-Capillary: 102 mg/dL — ABNORMAL HIGH (ref 65–99)

## 2016-01-03 LAB — HEPARIN LEVEL (UNFRACTIONATED): Heparin Unfractionated: 0.56 IU/mL (ref 0.30–0.70)

## 2016-01-03 MED ORDER — WARFARIN SODIUM 10 MG PO TABS
10.0000 mg | ORAL_TABLET | Freq: Once | ORAL | Status: AC
  Administered 2016-01-03: 10 mg via ORAL
  Filled 2016-01-03: qty 1

## 2016-01-03 NOTE — Progress Notes (Signed)
ANTICOAGULATION CONSULT NOTE   Pharmacy Consult for Heparin / Warfarin Indication: atrial fibrillation  No Known Allergies  Patient Measurements: Height: 5\' 8"  (172.7 cm) Weight: 206 lb 3.2 oz (93.532 kg) (scale b) IBW/kg (Calculated) : 68.4 Heparin Dosing Weight: 91.5 kg  Vital Signs: Temp: 97.6 F (36.4 C) (02/24 0442) Temp Source: Oral (02/24 0442) BP: 136/101 mmHg (02/24 0700) Pulse Rate: 121 (02/24 0535)  Labs:  Recent Labs  01/01/16 0733 01/01/16 1640 01/02/16 0513 01/03/16 0420  HGB 12.5*  --  12.3* 12.4*  HCT 40.0  --  39.1 39.4  PLT 132*  --  141* 128*  LABPROT 15.2  --  16.0* 15.9*  INR 1.18  --  1.26 1.25  HEPARINUNFRC 0.11* 0.49 0.45 0.56  CREATININE 1.46*  --  1.45* 1.45*    Estimated Creatinine Clearance: 56.3 mL/min (by C-G formula based on Cr of 1.45).   Assessment: 65yo male with history of Afib not taking his warfarin PTA presents with edema. Pharmacy is consulted to dose heparin for atrial fibrillation. Pt reports that he doesn't take any of his home meds including his coumadin.   Heparin level therapeutic CBC stable  INR = 1.26   Goal of Therapy:  INR 2-3 Heparin level 0.3-0.7 units/ml Monitor platelets by anticoagulation protocol: Yes   Plan:  Heparin at 1600 units / hr Warfarin 10 mg x1 Daily CBC, HL, Protime  Thank you Okey Regal, PharmD [phone]  01/03/2016 9:00 AM

## 2016-01-03 NOTE — Care Management Important Message (Signed)
Important Message  Patient Details  Name: [name] MRN: [id number] Date of Birth: 1950/11/23   Medicare Important Message Given:  Yes    Meigan Pates P Dorianne Perret 01/03/2016, 12:01 PM

## 2016-01-03 NOTE — Progress Notes (Signed)
[facility]                                                                              Patient Demographics  [name], is a 65 y.o. male, DOB - Mar 20, 1951, ZOX:[id number]  Admit date - 12/28/2015   Admitting Physician Calvert Cantor, MD  Outpatient Primary MD for the patient is [name], MD  LOS - 6   Chief Complaint  Patient presents with  . Leg Swelling      HPI on 12/28/2015 by [name], NP [name] is a 65 y.o. male with a past medical history that includes type 2 diabetes uncontrolled, chronic kidney disease stage III, obesity, lower extremity edema, hypertension, chronic systolic heart failure, obstructive sleep apnea A. fib noncompliant with Coumadin presents to the emergency department with chief complaint of worsening lower extremity edema. Initial evaluation reveals acute on chronic systolic heart failure with an elevated troponin. Information is obtained from the patient. He reports noncompliance with medication and follow-up medical care since 2015. He states he came to the emergency department when his legs swelled to the point that it caused him pain and made it difficult to walk. He denies any chest pain palpitations headache dizziness syncope or near-syncope. He denies orthopnea cough nausea vomiting. He denies dysuria hematuria frequency or urgency. 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, no evidence of ischemia, severe generalized hypokinesia with EF of 20% -Continue diuresis, patient was placed on 80 of Lasix BID -Continue metoprolol, losartan -UOP over past 24 hours  2500cc  Atrial fibrillation with RVR -Patient had TEE DCCV July 2015, has been on Coumadin past however was not compliant -Continue digoxin, metoprolol -Continue Coumadin and heparin -Plan for TEE and DCCV today 01/03/2016  Elevated troponin -Likely secondary to the above -Peak 0.48, consistent with demand ischemia in the setting of CHF and he flutter with RVR  Essential hypertension -Continue Lasix -Patient has been noncompliant, was supposed to be on Coreg, Lasix, lisinopril however has not taken these medications in over a year  Acute on chronic kidney disease, stage III -Creatinine 1.6 on admission, currently 1.45 -Baseline creatinine 1.1 -Continue to monitor BMP  Depression/psychosocial issues/noncompliance -Psychiatry consulted and appreciated-did not feel patient was candidate for inpatient psych admission, recommended Lexapro and Abilify  Diabetes mellitus, type II -Hemoglobin A1c 6 -Metformin held -Continue insulin sliding scale a CBG monitoring  Code Status: Full  Family Communication: None at bedside.  Disposition Plan: Admitted. Pending DCCV/TEE today.   Time Spent in minutes   30 minutes  Procedures  Echocardiogram Stress test  Consults   Cardiology Psychiatry  DVT Prophylaxis  Heparin/Couamdin  Lab Results  Component Value Date   PLT 128* 01/03/2016    Medications  Scheduled Meds: . ARIPiprazole  2 mg Oral BID  . digoxin  0.125 mg Oral Daily  . escitalopram  10 mg Oral Daily  .  furosemide  80 mg Oral BID  . insulin aspart  0-15 Units Subcutaneous TID WC  . insulin aspart  0-5 Units Subcutaneous QHS  . losartan  25 mg Oral Daily  . metoprolol succinate  50 mg Oral Daily  . potassium chloride  40 mEq Oral Daily  . sodium chloride flush  3 mL Intravenous Q12H  . warfarin  10 mg Oral ONCE-1800  . Warfarin - Pharmacist Dosing Inpatient   Does not apply q1800   Continuous Infusions: . heparin 1,600 Units/hr (01/03/16 1152)   PRN  Meds:.sodium chloride, acetaminophen, hydrOXYzine, ondansetron (ZOFRAN) IV, sodium chloride flush  Antibiotics    Anti-infectives    None      Subjective:   [name] seen and examined today.  Patient has no complaints this morning. Denies any chest pain, abdominal pain, nausea vomiting, dizziness or headache.   Objective:   Filed Vitals:   01/03/16 0535 01/03/16 0700 01/03/16 0919 01/03/16 1134  BP: 150/104 136/101 144/92 140/87  Pulse: 121  121 120  Temp:   97.2 F (36.2 C) 97.4 F (36.3 C)  TempSrc:   Oral Oral  Resp:   20 18  Height:      Weight:      SpO2:   100% 97%    Wt Readings from Last 3 Encounters:  01/03/16 93.532 kg (206 lb 3.2 oz)  06/19/14 96.333 kg (212 lb 6 oz)  05/21/14 103.874 kg (229 lb)     Intake/Output Summary (Last 24 hours) at 01/03/16 1233 Last data filed at 01/03/16 1100  Gross per 24 hour  Intake    848 ml  Output   2650 ml  Net  -1802 ml    Exam  General: Well developed, well nourished, NAD  HEENT: NCAT,  mucous membranes moist.   Cardiovascular: S1 S2 auscultated, irregularly irregular  Respiratory: Clear to auscultation  Abdomen: Soft, nontender, nondistended, + bowel sounds  Extremities: warm dry without cyanosis clubbing. +LE edema B/L  Neuro: AAOx3, nonfocal   Psych: Flat affect  Data Review   Micro Results No results found for this or any previous visit (from the past 240 hour(s)).  Radiology Reports Dg Chest 2 View  12/28/2015  CLINICAL DATA:  Acute chest pain shortness of breath, diabetes, hypertension EXAM: CHEST  2 VIEW COMPARISON:  05/08/2014 FINDINGS: Stable cardiomegaly with slight vascular congestion. Minor streaky bibasilar densities and trace pleural effusions. No definite focal pneumonia. No current CHF or pneumothorax. Trachea midline. Degenerative changes of the spine. IMPRESSION: cardiomegaly with trace pleural effusions and bibasilar atelectasis. Electronically Signed   By: Judie Petit.  Shick M.D.    On: 12/28/2015 12:45   Nm Myocar Multi W/spect W/wall Motion / Ef  12/31/2015  CLINICAL DATA:  Atrial fibrillation, CHF.  Diabetes, hypertension. EXAM: MYOCARDIAL IMAGING WITH SPECT (REST AND PHARMACOLOGIC-STRESS) GATED LEFT VENTRICULAR WALL MOTION STUDY LEFT VENTRICULAR EJECTION FRACTION TECHNIQUE: Standard myocardial SPECT imaging was performed after resting intravenous injection of 10 mCi Tc-67m sestamibi. Subsequently, intravenous infusion of Lexiscan was performed under the supervision of the Cardiology staff. At peak effect of the drug, 30 mCi Tc-45m sestamibi was injected intravenously and standard myocardial SPECT imaging was performed. Quantitative gated imaging was also performed to evaluate left ventricular wall motion, and estimate left ventricular ejection fraction. COMPARISON:  None. FINDINGS: Perfusion: Next defects noted on rest and stress images within the inferior wall and to lesser extent the septum. No reversible defects. Wall Motion: Severe generalized hypokinesia. Left ventricle is dilute Left Ventricular Ejection  Fraction: 20 % End diastolic volume 218 ml End systolic volume 173 ml IMPRESSION: 1. 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 No evidence of ischemia. 2. Severe generalized hypokinesia with dilated left ventricle. 3. Left ventricular ejection fraction 20% 4. High-risk stress test findings*. *2012 Appropriate Use Criteria for Coronary Revascularization Focused Update: J Am Coll Cardiol. 2012;59(9):857-881. http://content.dementiazones.com.aspx?articleid=[email or web address] Electronically Signed   By: Charlett Nose M.D.   On: 12/31/2015 14:09    CBC  Recent Labs Lab 12/30/15 0635 12/31/15 0340 01/01/16 0733 01/02/16 0513 01/03/16 0420  WBC 5.3 6.0 6.7 6.2 7.0  HGB 11.9* 12.0* 12.5* 12.3* 12.4*  HCT 37.1* 37.8* 40.0 39.1 39.4  PLT 124* 148* 132* 141* 128*  MCV 89.8 90.6 92.2 92.0 92.1  MCH 28.8 28.8 28.8 28.9 29.0  MCHC 32.1 31.7 31.3 31.5 31.5   RDW 15.1 15.0 15.1 15.3 15.2    Chemistries   Recent Labs Lab 12/30/15 0635 12/31/15 0340 01/01/16 0733 01/02/16 0513 01/03/16 0420  NA 140 143 142 139 138  K 3.0* 3.3* 3.9 3.7 3.8  CL 102 98* 99* 100* 102  CO2 28 30 28 29 27   GLUCOSE 132* 108* 123* 111* 100*  BUN 28* 27* 20 25* 29*  CREATININE 1.62* 1.66* 1.46* 1.45* 1.45*  CALCIUM 8.7* 8.8* 9.1 8.9 9.0   ------------------------------------------------------------------------------------------------------------------ estimated creatinine clearance is 56.3 mL/min (by C-G formula based on Cr of 1.45). ------------------------------------------------------------------------------------------------------------------ No results for input(s): HGBA1C in the last 72 hours. ------------------------------------------------------------------------------------------------------------------ No results for input(s): CHOL, HDL, LDLCALC, TRIG, CHOLHDL, LDLDIRECT in the last 72 hours. ------------------------------------------------------------------------------------------------------------------ No results for input(s): TSH, T4TOTAL, T3FREE, THYROIDAB in the last 72 hours.  Invalid input(s): FREET3 ------------------------------------------------------------------------------------------------------------------ No results for input(s): VITAMINB12, FOLATE, FERRITIN, TIBC, IRON, RETICCTPCT in the last 72 hours.  Coagulation profile  Recent Labs Lab 12/29/15 0430 01/01/16 0733 01/02/16 0513 01/03/16 0420  INR 1.45 1.18 1.26 1.25    No results for input(s): DDIMER in the last 72 hours.  Cardiac Enzymes  Recent Labs Lab 12/28/15 1947 12/29/15 0430 12/30/15 1611  TROPONINI 0.27* 0.48* 0.31*   ------------------------------------------------------------------------------------------------------------------ Invalid input(s): POCBNP    Dak Szumski D.O. on 01/03/2016 at 12:33 PM  Between 7am to 7pm - Pager -  [phone]  After 7pm go to [email or web address] - password [email or web address]  And look for the night coverage person covering for me after hours  [facility] Office  [phone]

## 2016-01-03 NOTE — Progress Notes (Signed)
Pt B/P is 150/104 manually and dynamap, NP notified, gave order to give 10am blood pressure medications. Client alert and oriented, NAD vi\oiced, will continue to monitor.

## 2016-01-04 DIAGNOSIS — I5043 Acute on chronic combined systolic (congestive) and diastolic (congestive) heart failure: Secondary | ICD-10-CM | POA: Insufficient documentation

## 2016-01-04 DIAGNOSIS — I110 Hypertensive heart disease with heart failure: Secondary | ICD-10-CM | POA: Insufficient documentation

## 2016-01-04 LAB — CBC
HCT: 39.4 % (ref 39.0–52.0)
HEMOGLOBIN: 12 g/dL — AB (ref 13.0–17.0)
MCH: 27.6 pg (ref 26.0–34.0)
MCHC: 30.5 g/dL (ref 30.0–36.0)
MCV: 90.8 fL (ref 78.0–100.0)
PLATELETS: 124 10*3/uL — AB (ref 150–400)
RBC: 4.34 MIL/uL (ref 4.22–5.81)
RDW: 15 % (ref 11.5–15.5)
WBC: 5.3 10*3/uL (ref 4.0–10.5)

## 2016-01-04 LAB — BASIC METABOLIC PANEL
Anion gap: 8 (ref 5–15)
BUN: 30 mg/dL — AB (ref 6–20)
CALCIUM: 8.9 mg/dL (ref 8.9–10.3)
CO2: 28 mmol/L (ref 22–32)
CREATININE: 1.5 mg/dL — AB (ref 0.61–1.24)
Chloride: 102 mmol/L (ref 101–111)
GFR calc non Af Amer: 47 mL/min — ABNORMAL LOW (ref >60)
GFR, EST AFRICAN AMERICAN: 55 mL/min — AB (ref >60)
GLUCOSE: 105 mg/dL — AB (ref 65–99)
Potassium: 3.9 mmol/L (ref 3.5–5.1)
SODIUM: 138 mmol/L (ref 135–145)

## 2016-01-04 LAB — HEPARIN LEVEL (UNFRACTIONATED): HEPARIN UNFRACTIONATED: 0.36 [IU]/mL (ref 0.30–0.70)

## 2016-01-04 LAB — GLUCOSE, CAPILLARY
Glucose-Capillary: 112 mg/dL — ABNORMAL HIGH (ref 65–99)
Glucose-Capillary: 127 mg/dL — ABNORMAL HIGH (ref 65–99)
Glucose-Capillary: 110 mg/dL — ABNORMAL HIGH (ref 65–99)

## 2016-01-04 LAB — PROTIME-INR
INR: 1.57 — ABNORMAL HIGH (ref 0.00–1.49)
Prothrombin Time: 18.8 seconds — ABNORMAL HIGH (ref 11.6–15.2)

## 2016-01-04 MED ORDER — METOPROLOL SUCCINATE ER 50 MG PO TB24
75.0000 mg | ORAL_TABLET | Freq: Every day | ORAL | Status: DC
  Administered 2016-01-05 – 2016-01-06 (×2): 75 mg via ORAL
  Filled 2016-01-04 (×2): qty 1

## 2016-01-04 MED ORDER — WARFARIN SODIUM 10 MG PO TABS
10.0000 mg | ORAL_TABLET | Freq: Once | ORAL | Status: AC
  Administered 2016-01-04: 10 mg via ORAL
  Filled 2016-01-04: qty 1

## 2016-01-04 NOTE — Progress Notes (Signed)
ANTICOAGULATION CONSULT NOTE   Pharmacy Consult for Heparin / Warfarin Indication: atrial fibrillation  No Known Allergies  Patient Measurements: Height: 5\' 8"  (172.7 cm) Weight: 200 lb 14.4 oz (91.128 kg) IBW/kg (Calculated) : 68.4 Heparin Dosing Weight: 91.5 kg  Vital Signs: Temp: 97.5 F (36.4 C) (02/25 0442) Temp Source: Oral (02/25 0442) BP: 147/100 mmHg (02/25 0442) Pulse Rate: 120 (02/25 0442)  Labs:  Recent Labs  01/02/16 0513 01/03/16 0420 01/04/16 0224  HGB 12.3* 12.4* 12.0*  HCT 39.1 39.4 39.4  PLT 141* 128* 124*  LABPROT 16.0* 15.9* 18.8*  INR 1.26 1.25 1.57*  HEPARINUNFRC 0.45 0.56 0.36  CREATININE 1.45* 1.45* 1.50*    Estimated Creatinine Clearance: 53.8 mL/min (by C-G formula based on Cr of 1.5).   Assessment: 65yo male with history of Afib not taking his warfarin PTA presents with edema. Pharmacy is consulted to dose heparin for atrial flutter. Pt reports that he doesn't take any of his home meds including his coumadin.   CHA2DS2-Vasc score at least 4 (HTN, DM, age, HF)  Heparin level therapeutic CBC stable  INR = 1.57  Goal of Therapy:  INR 2-3 Heparin level 0.3-0.7 units/ml Monitor platelets by anticoagulation protocol: Yes   Plan:  Continue heparin at 1600 units/hr Warfarin 10 mg x1 today Daily CBC, HL, INR Follow up plans for continued anticoagulation given patients noncompliance   Sherron Monday, PharmD Clinical Pharmacy Resident Pager: [phone] 01/04/2016 7:53 AM

## 2016-01-04 NOTE — Progress Notes (Signed)
SUBJECTIVE:  Feels well.  No complaints.    PHYSICAL EXAM Filed Vitals:   01/03/16 0919 01/03/16 1134 01/03/16 1943 01/04/16 0442  BP: 144/92 140/87 123/97 147/100  Pulse: 121 120 119 120  Temp: 97.2 F (36.2 C) 97.4 F (36.3 C) 97.5 F (36.4 C) 97.5 F (36.4 C)  TempSrc: Oral Oral Oral Oral  Resp: 20 18 18 18   Height:      Weight:    91.128 kg (200 lb 14.4 oz)  SpO2: 100% 97% 99% 97%   General:  Well-appearing.  No acute distress Neck: No JVD Lungs:  CTAB.  No crackles, rhonchi or wheezes. Heart:  Tachycardic.  Regular rhythm.  No m/r/g Abdomen:  Soft, NT, ND.  +BS  Extremities:  WWP.  No edema.  LABS: Lab Results  Component Value Date   TROPONINI 0.31* 12/30/2015   Results for orders placed or performed during the hospital encounter of 12/28/15 (from the past 24 hour(s))  Glucose, capillary     Status: Abnormal   Collection Time: 01/03/16  4:35 PM  Result Value Ref Range   Glucose-Capillary 122 (H) 65 - 99 mg/dL  Glucose, capillary     Status: Abnormal   Collection Time: 01/03/16  9:01 PM  Result Value Ref Range   Glucose-Capillary 107 (H) 65 - 99 mg/dL  Heparin level (unfractionated)     Status: None   Collection Time: 01/04/16  2:24 AM  Result Value Ref Range   Heparin Unfractionated 0.36 0.30 - 0.70 IU/mL  CBC     Status: Abnormal   Collection Time: 01/04/16  2:24 AM  Result Value Ref Range   WBC 5.3 4.0 - 10.5 K/uL   RBC 4.34 4.22 - 5.81 MIL/uL   Hemoglobin 12.0 (L) 13.0 - 17.0 g/dL   HCT 38.7 56.4 - 33.2 %   MCV 90.8 78.0 - 100.0 fL   MCH 27.6 26.0 - 34.0 pg   MCHC 30.5 30.0 - 36.0 g/dL   RDW 95.1 88.4 - 16.6 %   Platelets 124 (L) 150 - 400 K/uL  Basic metabolic panel     Status: Abnormal   Collection Time: 01/04/16  2:24 AM  Result Value Ref Range   Sodium 138 135 - 145 mmol/L   Potassium 3.9 3.5 - 5.1 mmol/L   Chloride 102 101 - 111 mmol/L   CO2 28 22 - 32 mmol/L   Glucose, Bld 105 (H) 65 - 99 mg/dL   BUN 30 (H) 6 - 20 mg/dL   Creatinine, Ser 0.63 (H) 0.61 - 1.24 mg/dL   Calcium 8.9 8.9 - 01.6 mg/dL   GFR calc non Af Amer 47 (L) >60 mL/min   GFR calc Af Amer 55 (L) >60 mL/min   Anion gap 8 5 - 15  Protime-INR     Status: Abnormal   Collection Time: 01/04/16  2:24 AM  Result Value Ref Range   Prothrombin Time 18.8 (H) 11.6 - 15.2 seconds   INR 1.57 (H) 0.00 - 1.49  Glucose, capillary     Status: Abnormal   Collection Time: 01/04/16  6:57 AM  Result Value Ref Range   Glucose-Capillary 112 (H) 65 - 99 mg/dL    Intake/Output Summary (Last 24 hours) at 01/04/16 1134 Last data filed at 01/04/16 0941  Gross per 24 hour  Intake 1278.94 ml  Output   2550 ml  Net -1271.06 ml    Telemetry: Atrial flutter.  Rate ~120 bpm.  ASSESSMENT AND PLAN:  Principal Problem:  Acute on chronic systolic heart failure (HCC) Active Problems:   Swelling of lower extremity   Depression   Type 2 diabetes, uncontrolled, with renal manifestation (HCC)   CKD (chronic kidney disease), stage II   Obesity (BMI 30-39.9)   HTN (hypertension)   Atrial flutter (HCC)   Elevated troponin   Psychiatric diagnosis deferred   # Acute on chronic systolic heart failure:  Euvolemic on exam.  LVEF 20-25% with diffuse hypokinesis. There is akinesis of the basal-mid anteroseptal and apical anteriormyocardium. There is akinesis of the basalinferior myocardium. LA severely dilated. PA pressure . Continue digoxin, losartan, and lasix. - Increase metoprolol to 75 mg given poorly-controlled BP  # Atrial flutter with RVR:  Rates remain in the 120s.  TEE with DCCV on Monday at 2 pm.  # Acute on chronic renal failure: Thought to be due to medication non-compliance.    # Hypertensive heart disease: BP poorly-controlled.  Increasing metoprolol as above.  # Elevated troponin: Thought to be demand ischemia.  No chest pain.  Time spent: 25 minutes-Greater than 50% of this time was spent in counseling, explanation of diagnosis, planning of  further management, and coordination of care.    Farhana Fellows C. Duke Salvia, MD, [facility] 01/04/2016 11:34 AM

## 2016-01-04 NOTE — Progress Notes (Signed)
[facility]                                                                              Patient Demographics  [name], is a 65 y.o. male, DOB - 07-Feb-1951, OQH:[id number]  Admit date - 12/28/2015   Admitting Physician Calvert Cantor, MD  Outpatient Primary MD for the patient is [name], MD  LOS - 7   Chief Complaint  Patient presents with  . Leg Swelling      HPI on 12/28/2015 by [name], NP [name] is a 66 y.o. male with a past medical history that includes type 2 diabetes uncontrolled, chronic kidney disease stage III, obesity, lower extremity edema, hypertension, chronic systolic heart failure, obstructive sleep apnea A. fib noncompliant with Coumadin presents to the emergency department with chief complaint of worsening lower extremity edema. Initial evaluation reveals acute on chronic systolic heart failure with an elevated troponin. Information is obtained from the patient. He reports noncompliance with medication and follow-up medical care since 2015. He states he came to the emergency department when his legs swelled to the point that it caused him pain and made it difficult to walk. He denies any chest pain palpitations headache dizziness syncope or near-syncope. He denies orthopnea cough nausea vomiting. He denies dysuria hematuria frequency or urgency. 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, no evidence of ischemia, severe generalized hypokinesia with EF of 20% -Continue diuresis, patient was placed on 80 of Lasix BID -Continue metoprolol, losartan -UOP over past 24 hours  3000cc  Atrial fibrillation with RVR -Patient had TEE DCCV July 2015, has been on Coumadin past however was not compliant -Continue digoxin, metoprolol -Continue Coumadin and heparin -Plan for TEE and DCCV today 01/06/2016 (there were some scheduling issues on 2/24) -INR 1.57 today  Elevated troponin -Likely secondary to the above -Peak 0.48, consistent with demand ischemia in the setting of CHF and he flutter with RVR  Essential hypertension -Continue Lasix -Patient has been noncompliant, was supposed to be on Coreg, Lasix, lisinopril however has not taken these medications in over a year  Acute on chronic kidney disease, stage III -Creatinine 1.6 on admission, currently 1.50 -Baseline creatinine 1.1 -Continue to monitor BMP  Depression/psychosocial issues/noncompliance -Psychiatry consulted and appreciated-did not feel patient was candidate for inpatient psych admission, recommended Lexapro and Abilify  Diabetes mellitus, type II -Hemoglobin A1c 6 -Metformin held -Continue insulin sliding scale a CBG monitoring  Code Status: Full  Family Communication: None at bedside.  Disposition Plan: Admitted. Pending DCCV/TEE 01/06/2016  Time Spent in minutes   30 minutes  Procedures  Echocardiogram Stress test  Consults   Cardiology Psychiatry  DVT Prophylaxis  Heparin/Couamdin  Lab Results  Component Value Date   PLT 124* 01/04/2016    Medications  Scheduled Meds: . ARIPiprazole  2 mg Oral BID  . digoxin  0.125 mg Oral Daily  .  escitalopram  10 mg Oral Daily  . furosemide  80 mg Oral BID  . insulin aspart  0-15 Units Subcutaneous TID WC  . insulin aspart  0-5 Units Subcutaneous QHS  . losartan  25 mg Oral Daily  . metoprolol succinate  50 mg Oral Daily  . potassium chloride  40 mEq Oral Daily  . sodium chloride flush  3 mL Intravenous Q12H  . warfarin  10 mg Oral ONCE-1800  . Warfarin - Pharmacist Dosing Inpatient   Does not apply q1800   Continuous  Infusions: . heparin 1,600 Units/hr (01/04/16 0438)   PRN Meds:.sodium chloride, acetaminophen, hydrOXYzine, ondansetron (ZOFRAN) IV, sodium chloride flush  Antibiotics    Anti-infectives    None      Subjective:   [name] seen and examined today.  Patient has no complaints this morning. Denies any chest pain, abdominal pain, nausea vomiting, dizziness or headache. Patient upset that his cardioversion did not occur yesterday.   Objective:   Filed Vitals:   01/03/16 0919 01/03/16 1134 01/03/16 1943 01/04/16 0442  BP: 144/92 140/87 123/97 147/100  Pulse: 121 120 119 120  Temp: 97.2 F (36.2 C) 97.4 F (36.3 C) 97.5 F (36.4 C) 97.5 F (36.4 C)  TempSrc: Oral Oral Oral Oral  Resp: Height:      Weight:    91.128 kg (200 lb 14.4 oz)  SpO2: 100% 97% 99% 97%    Wt Readings from Last 3 Encounters:  01/04/16 91.128 kg (200 lb 14.4 oz)  06/19/14 96.333 kg (212 lb 6 oz)  05/21/14 103.874 kg (229 lb)     Intake/Output Summary (Last 24 hours) at 01/04/16 1141 Last data filed at 01/04/16 0941  Gross per 24 hour  Intake 1278.94 ml  Output   2550 ml  Net -1271.06 ml    Exam  General: Well developed, well nourished, NAD  HEENT: NCAT,  mucous membranes moist.   Cardiovascular: S1 S2 auscultated, irregularly irregular  Respiratory: Clear to auscultation, no wheezing  Abdomen: Soft, nontender, nondistended, + bowel sounds  Extremities: warm dry without cyanosis clubbing. +LE edema L>R- improving  Neuro: AAOx3, nonfocal   Psych: Flat affect  Data Review   Micro Results No results found for this or any previous visit (from the past 240 hour(s)).  Radiology Reports Dg Chest 2 View  12/28/2015  CLINICAL DATA:  Acute chest pain shortness of breath, diabetes, hypertension EXAM: CHEST  2 VIEW COMPARISON:  05/08/2014 FINDINGS: Stable cardiomegaly with slight vascular congestion. Minor streaky bibasilar densities and trace pleural effusions. No  definite focal pneumonia. No current CHF or pneumothorax. Trachea midline. Degenerative changes of the spine. IMPRESSION: cardiomegaly with trace pleural effusions and bibasilar atelectasis. Electronically Signed   By: Judie Petit.  Shick M.D.   On: 12/28/2015 12:45   Nm Myocar Multi W/spect W/wall Motion / Ef  12/31/2015  CLINICAL DATA:  Atrial fibrillation, CHF.  Diabetes, hypertension. EXAM: MYOCARDIAL IMAGING WITH SPECT (REST AND PHARMACOLOGIC-STRESS) GATED LEFT VENTRICULAR WALL MOTION STUDY LEFT VENTRICULAR EJECTION FRACTION TECHNIQUE: Standard myocardial SPECT imaging was performed after resting intravenous injection of 10 mCi Tc-89m sestamibi. Subsequently, intravenous infusion of Lexiscan was performed under the supervision of the Cardiology staff. At peak effect of the drug, 30 mCi Tc-6m sestamibi was injected intravenously and standard myocardial SPECT imaging was performed. Quantitative gated imaging was also performed to evaluate left ventricular wall motion, and estimate left ventricular ejection fraction. COMPARISON:  None. FINDINGS: Perfusion: Next defects noted on rest and  stress images within the inferior wall and to lesser extent the septum. No reversible defects. Wall Motion: Severe generalized hypokinesia. Left ventricle is dilute Left Ventricular Ejection Fraction: 20 % End diastolic volume 218 ml End systolic volume 173 ml IMPRESSION: 1. 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 No evidence of ischemia. 2. Severe generalized hypokinesia with dilated left ventricle. 3. Left ventricular ejection fraction 20% 4. High-risk stress test findings*. *2012 Appropriate Use Criteria for Coronary Revascularization Focused Update: J Am Coll Cardiol. 2012;59(9):857-881. http://content.[email or web address].aspx?articleid=[email or web address] Electronically Signed   By: Charlett Nose M.D.   On: 12/31/2015 14:09    CBC  Recent Labs Lab 12/31/15 0340 01/01/16 0733 01/02/16 0513 01/03/16 0420  01/04/16 0224  WBC 6.0 6.7 6.2 7.0 5.3  HGB 12.0* 12.5* 12.3* 12.4* 12.0*  HCT 37.8* 40.0 39.1 39.4 39.4  PLT 148* 132* 141* 128* 124*  MCV 90.6 92.2 92.0 92.1 90.8  MCH 28.8 28.8 28.9 29.0 27.6  MCHC 31.7 31.3 31.5 31.5 30.5  RDW 15.0 15.1 15.3 15.2 15.0    Chemistries   Recent Labs Lab 12/31/15 0340 01/01/16 0733 01/02/16 0513 01/03/16 0420 01/04/16 0224  NA 143 142 139 138 138  K 3.3* 3.9 3.7 3.8 3.9  CL 98* 99* 100* 102 102  CO2 30 28 29 27 28   GLUCOSE 108* 123* 111* 100* 105*  BUN 27* 20 25* 29* 30*  CREATININE 1.66* 1.46* 1.45* 1.45* 1.50*  CALCIUM 8.8* 9.1 8.9 9.0 8.9   ------------------------------------------------------------------------------------------------------------------ estimated creatinine clearance is 53.8 mL/min (by C-G formula based on Cr of 1.5). ------------------------------------------------------------------------------------------------------------------ No results for input(s): HGBA1C in the last 72 hours. ------------------------------------------------------------------------------------------------------------------ No results for input(s): CHOL, HDL, LDLCALC, TRIG, CHOLHDL, LDLDIRECT in the last 72 hours. ------------------------------------------------------------------------------------------------------------------ No results for input(s): TSH, T4TOTAL, T3FREE, THYROIDAB in the last 72 hours.  Invalid input(s): FREET3 ------------------------------------------------------------------------------------------------------------------ No results for input(s): VITAMINB12, FOLATE, FERRITIN, TIBC, IRON, RETICCTPCT in the last 72 hours.  Coagulation profile  Recent Labs Lab 12/29/15 0430 01/01/16 0733 01/02/16 0513 01/03/16 0420 01/04/16 0224  INR 1.45 1.18 1.26 1.25 1.57*    No results for input(s): DDIMER in the last 72 hours.  Cardiac Enzymes  Recent Labs Lab 12/28/15 1947 12/29/15 0430 12/30/15 1611  TROPONINI 0.27*  0.48* 0.31*   ------------------------------------------------------------------------------------------------------------------ Invalid input(s): POCBNP    Michiel Sivley D.O. on 01/04/2016 at 11:41 AM  Between 7am to 7pm - Pager - [phone]  After 7pm go to [email or web address] - password [email or web address]  And look for the night coverage person covering for me after hours  [facility] Office  [phone]

## 2016-01-05 LAB — BASIC METABOLIC PANEL
ANION GAP: 10 (ref 5–15)
Anion gap: 12 (ref 5–15)
BUN: 27 mg/dL — ABNORMAL HIGH (ref 6–20)
BUN: 27 mg/dL — AB (ref 6–20)
CALCIUM: 9.5 mg/dL (ref 8.9–10.3)
CALCIUM: 9.4 mg/dL (ref 8.9–10.3)
CO2: 26 mmol/L (ref 22–32)
CO2: 28 mmol/L (ref 22–32)
Chloride: 102 mmol/L (ref 101–111)
Chloride: 101 mmol/L (ref 101–111)
Creatinine, Ser: 1.37 mg/dL — ABNORMAL HIGH (ref 0.61–1.24)
Creatinine, Ser: 1.39 mg/dL — ABNORMAL HIGH (ref 0.61–1.24)
GFR calc Af Amer: 60 mL/min — ABNORMAL LOW (ref >60)
GFR, EST NON AFRICAN AMERICAN: 53 mL/min — AB (ref >60)
GFR, EST NON AFRICAN AMERICAN: 52 mL/min — AB (ref >60)
GLUCOSE: 119 mg/dL — AB (ref 65–99)
Glucose, Bld: 113 mg/dL — ABNORMAL HIGH (ref 65–99)
POTASSIUM: 4.4 mmol/L (ref 3.5–5.1)
Potassium: 4.1 mmol/L (ref 3.5–5.1)
SODIUM: 140 mmol/L (ref 135–145)
SODIUM: 139 mmol/L (ref 135–145)

## 2016-01-05 LAB — CBC
HCT: 40.2 % (ref 39.0–52.0)
HEMOGLOBIN: 12.9 g/dL — AB (ref 13.0–17.0)
MCH: 28.5 pg (ref 26.0–34.0)
MCHC: 32.1 g/dL (ref 30.0–36.0)
MCV: 88.9 fL (ref 78.0–100.0)
PLATELETS: 133 10*3/uL — AB (ref 150–400)
RBC: 4.52 MIL/uL (ref 4.22–5.81)
RDW: 15 % (ref 11.5–15.5)
WBC: 6.2 10*3/uL (ref 4.0–10.5)

## 2016-01-05 LAB — GLUCOSE, CAPILLARY
GLUCOSE-CAPILLARY: 108 mg/dL — AB (ref 65–99)
GLUCOSE-CAPILLARY: 122 mg/dL — AB (ref 65–99)
Glucose-Capillary: 134 mg/dL — ABNORMAL HIGH (ref 65–99)
Glucose-Capillary: 133 mg/dL — ABNORMAL HIGH (ref 65–99)

## 2016-01-05 LAB — PROTIME-INR
INR: 1.9 — ABNORMAL HIGH (ref 0.00–1.49)
Prothrombin Time: 21.7 seconds — ABNORMAL HIGH (ref 11.6–15.2)

## 2016-01-05 LAB — HEPARIN LEVEL (UNFRACTIONATED): HEPARIN UNFRACTIONATED: 0.54 [IU]/mL (ref 0.30–0.70)

## 2016-01-05 LAB — MAGNESIUM: Magnesium: 2.3 mg/dL (ref 1.7–2.4)

## 2016-01-05 MED ORDER — HYDROCORTISONE 1 % EX CREA
1.0000 "application " | TOPICAL_CREAM | Freq: Three times a day (TID) | CUTANEOUS | Status: DC | PRN
  Filled 2016-01-05: qty 28

## 2016-01-05 MED ORDER — WARFARIN SODIUM 10 MG PO TABS
10.0000 mg | ORAL_TABLET | Freq: Once | ORAL | Status: AC
  Administered 2016-01-05: 10 mg via ORAL

## 2016-01-05 MED ORDER — SODIUM CHLORIDE 0.9 % IV SOLN
250.0000 mL | INTRAVENOUS | Status: DC
Start: 2016-01-05 — End: 2016-01-10
  Administered 2016-01-05: 250 mL via INTRAVENOUS

## 2016-01-05 MED ORDER — SODIUM CHLORIDE 0.9% FLUSH
3.0000 mL | Freq: Two times a day (BID) | INTRAVENOUS | Status: DC
  Administered 2016-01-05 – 2016-01-08 (×5): 3 mL via INTRAVENOUS

## 2016-01-05 MED ORDER — SODIUM CHLORIDE 0.9% FLUSH: 3.0000 mL | INTRAVENOUS | Status: DC | PRN

## 2016-01-05 NOTE — Progress Notes (Addendum)
ANTICOAGULATION CONSULT NOTE   Pharmacy Consult for Heparin / Warfarin Indication: atrial fibrillation  No Known Allergies  Patient Measurements: Height: 5\' 8"  (172.7 cm) Weight: 195 lb 9.6 oz (88.724 kg) IBW/kg (Calculated) : 68.4 Heparin Dosing Weight: 91.5 kg  Vital Signs: Temp: 97.6 F (36.4 C) (02/26 0520) Temp Source: Oral (02/26 0520) BP: 131/100 mmHg (02/26 0520) Pulse Rate: 121 (02/26 0520)  Labs:  Recent Labs  01/03/16 0420 01/04/16 0224 01/05/16 0516  HGB 12.4* 12.0* 12.9*  HCT 39.4 39.4 40.2  PLT 128* 124* 133*  LABPROT 15.9* 18.8* 21.7*  INR 1.25 1.57* 1.90*  HEPARINUNFRC 0.56 0.36 0.54  CREATININE 1.45* 1.50* 1.37*    Estimated Creatinine Clearance: 58.2 mL/min (by C-G formula based on Cr of 1.37).   Assessment: 65yo male with history of Afib not taking his warfarin PTA presents with edema. Pharmacy is consulted to dose heparin for atrial flutter. Pt reports that he doesn't take any of his home meds including his coumadin.   CHA2DS2-Vasc score at least 4 (HTN, DM, age, HF)  Heparin level therapeutic CBC stable  INR = 1.90  Goal of Therapy:  INR 2-3 Heparin level 0.3-0.7 units/ml Monitor platelets by anticoagulation protocol: Yes   Plan:  Continue heparin at 1600 units/hr Warfarin 10 mg x1 today Daily CBC, HL, INR Follow up plans for continued anticoagulation given patients noncompliance   Sherron Monday, PharmD Clinical Pharmacy Resident Pager: [phone] 01/05/2016 7:42 AM

## 2016-01-05 NOTE — Progress Notes (Signed)
[facility]                                                                              Patient Demographics  [name], is a 65 y.o. male, DOB - 10-03-1951, ZOX:[id number]  Admit date - 12/28/2015   Admitting Physician Calvert Cantor, MD  Outpatient Primary MD for the patient is [name], MD  LOS - 8   Chief Complaint  Patient presents with  . Leg Swelling      HPI on 12/28/2015 by [name], NP [name] is a 65 y.o. male with a past medical history that includes type 2 diabetes uncontrolled, chronic kidney disease stage III, obesity, lower extremity edema, hypertension, chronic systolic heart failure, obstructive sleep apnea A. fib noncompliant with Coumadin presents to the emergency department with chief complaint of worsening lower extremity edema. Initial evaluation reveals acute on chronic systolic heart failure with an elevated troponin. Information is obtained from the patient. He reports noncompliance with medication and follow-up medical care since 2015. He states he came to the emergency department when his legs swelled to the point that it caused him pain and made it difficult to walk. He denies any chest pain palpitations headache dizziness syncope or near-syncope. He denies orthopnea cough nausea vomiting. He denies dysuria hematuria frequency or urgency. 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, no evidence of ischemia, severe generalized hypokinesia with EF of 20% -Continue diuresis, patient was placed on 80 of Lasix BID -Continue metoprolol, losartan -UOP over past 24 hours  3995cc  Atrial fibrillation with RVR -Patient had TEE DCCV July 2015, has been on Coumadin past however was not compliant -Continue digoxin, metoprolol -Continue Coumadin and heparin -Plan for TEE and DCCV today 01/06/2016 (there were some scheduling issues on 2/24) -INR 1.90 today  Elevated troponin -Likely secondary to the above -Peak 0.48, consistent with demand ischemia in the setting of CHF and he flutter with RVR  Essential hypertension -Continue Lasix -Patient has been noncompliant, was supposed to be on Coreg, Lasix, lisinopril however has not taken these medications in over a year  Acute on chronic kidney disease, stage III -Creatinine 1.6 on admission, currently 1.37 -Baseline creatinine 1.1 -Continue to monitor BMP  Depression/psychosocial issues/noncompliance -Psychiatry consulted and appreciated-did not feel patient was candidate for inpatient psych admission, recommended Lexapro and Abilify  Diabetes mellitus, type II -Hemoglobin A1c 6 -Metformin held -Continue insulin sliding scale a CBG monitoring  Code Status: Full  Family Communication: None at bedside.  Disposition Plan: Admitted. Pending DCCV/TEE 01/06/2016  Time Spent in minutes   30 minutes  Procedures  Echocardiogram Stress test  Consults   Cardiology Psychiatry  DVT Prophylaxis  Heparin/Couamdin  Lab Results  Component Value Date   PLT 133* 01/05/2016    Medications  Scheduled Meds: . ARIPiprazole  2 mg Oral BID  . digoxin  0.125 mg Oral Daily  .  escitalopram  10 mg Oral Daily  . furosemide  80 mg Oral BID  . insulin aspart  0-15 Units Subcutaneous TID WC  . insulin aspart  0-5 Units Subcutaneous QHS  . losartan  25 mg Oral Daily  . metoprolol succinate  75 mg Oral Daily  . potassium chloride  40 mEq Oral Daily  . sodium chloride flush  3 mL Intravenous Q12H  . sodium chloride flush  3 mL Intravenous Q12H  . warfarin  10 mg Oral ONCE-1800  . Warfarin - Pharmacist Dosing  Inpatient   Does not apply q1800   Continuous Infusions: . sodium chloride    . heparin 1,600 Units/hr (01/04/16 2248)   PRN Meds:.sodium chloride, acetaminophen, hydrocortisone cream, hydrOXYzine, ondansetron (ZOFRAN) IV, sodium chloride flush, sodium chloride flush  Antibiotics    Anti-infectives    None      Subjective:   [name] seen and examined today.  Patient has no complaints this morning. Denies any chest pain, abdominal pain, nausea vomiting, dizziness or headache.  Objective:   Filed Vitals:   01/04/16 0442 01/04/16 1243 01/04/16 2005 01/05/16 0520  BP: 147/100 130/84 127/88 131/100  Pulse: 120 118 121 121  Temp: 97.5 F (36.4 C) 97.5 F (36.4 C) 97.5 F (36.4 C) 97.6 F (36.4 C)  TempSrc: Oral Oral Oral Oral  Resp: 18 20 20 20   Height:      Weight: 91.128 kg (200 lb 14.4 oz)   88.724 kg (195 lb 9.6 oz)  SpO2: 97% 98% 97% 97%    Wt Readings from Last 3 Encounters:  01/05/16 88.724 kg (195 lb 9.6 oz)  06/19/14 96.333 kg (212 lb 6 oz)  05/21/14 103.874 kg (229 lb)     Intake/Output Summary (Last 24 hours) at 01/05/16 1103 Last data filed at 01/05/16 0907  Gross per 24 hour  Intake    720 ml  Output   3995 ml  Net  -3275 ml    Exam  General: Well developed, well nourished, NAD  HEENT: NCAT,  mucous membranes moist.   Cardiovascular: S1 S2 auscultated, tachycardic  Respiratory: Clear to auscultation, no wheezing  Abdomen: Soft, nontender, nondistended, + bowel sounds  Extremities: warm dry without cyanosis clubbing. +LE edema, improving  Neuro: AAOx3, nonfocal   Psych: Flat affect  Data Review   Micro Results No results found for this or any previous visit (from the past 240 hour(s)).  Radiology Reports Dg Chest 2 View  12/28/2015  CLINICAL DATA:  Acute chest pain shortness of breath, diabetes, hypertension EXAM: CHEST  2 VIEW COMPARISON:  05/08/2014 FINDINGS: Stable cardiomegaly with slight vascular congestion. Minor streaky  bibasilar densities and trace pleural effusions. No definite focal pneumonia. No current CHF or pneumothorax. Trachea midline. Degenerative changes of the spine. IMPRESSION: cardiomegaly with trace pleural effusions and bibasilar atelectasis. Electronically Signed   By: Judie Petit.  Shick M.D.   On: 12/28/2015 12:45   Nm Myocar Multi W/spect W/wall Motion / Ef  12/31/2015  CLINICAL DATA:  Atrial fibrillation, CHF.  Diabetes, hypertension. EXAM: MYOCARDIAL IMAGING WITH SPECT (REST AND PHARMACOLOGIC-STRESS) GATED LEFT VENTRICULAR WALL MOTION STUDY LEFT VENTRICULAR EJECTION FRACTION TECHNIQUE: Standard myocardial SPECT imaging was performed after resting intravenous injection of 10 mCi Tc-59m sestamibi. Subsequently, intravenous infusion of Lexiscan was performed under the supervision of the Cardiology staff. At peak effect of the drug, 30 mCi Tc-52m sestamibi was injected intravenously and standard myocardial SPECT imaging was performed. Quantitative gated imaging was also performed to evaluate left ventricular wall  motion, and estimate left ventricular ejection fraction. COMPARISON:  None. FINDINGS: Perfusion: Next defects noted on rest and stress images within the inferior wall and to lesser extent the septum. No reversible defects. Wall Motion: Severe generalized hypokinesia. Left ventricle is dilute Left Ventricular Ejection Fraction: 20 % End diastolic volume 218 ml End systolic volume 173 ml IMPRESSION: 1. 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 No evidence of ischemia. 2. Severe generalized hypokinesia with dilated left ventricle. 3. Left ventricular ejection fraction 20% 4. High-risk stress test findings*. *2012 Appropriate Use Criteria for Coronary Revascularization Focused Update: J Am Coll Cardiol. 2012;59(9):857-881. http://content.[email or web address]?articleid=[email or web address] Electronically Signed   By: Charlett Nose M.D.   On: 12/31/2015 14:09    CBC  Recent Labs Lab  01/01/16 0733 01/02/16 0513 01/03/16 0420 01/04/16 0224 01/05/16 0516  WBC 6.7 6.2 7.0 5.3 6.2  HGB 12.5* 12.3* 12.4* 12.0* 12.9*  HCT 40.0 39.1 39.4 39.4 40.2  PLT 132* 141* 128* 124* 133*  MCV 92.2 92.0 92.1 90.8 88.9  MCH 28.8 28.9 29.0 27.6 28.5  MCHC 31.3 31.5 31.5 30.5 32.1  RDW 15.1 15.3 15.2 15.0 15.0    Chemistries   Recent Labs Lab 01/01/16 0733 01/02/16 0513 01/03/16 0420 01/04/16 0224 01/05/16 0516  NA 142 139 138 138 140  K 3.9 3.7 3.8 3.9 4.1  CL 99* 100* 102 102 102  CO2 28 29 27 28 26   GLUCOSE 123* 111* 100* 105* 113*  BUN 20 25* 29* 30* 27*  CREATININE 1.46* 1.45* 1.45* 1.50* 1.37*  CALCIUM 9.1 8.9 9.0 8.9 9.5   ------------------------------------------------------------------------------------------------------------------ estimated creatinine clearance is 58.2 mL/min (by C-G formula based on Cr of 1.37). ------------------------------------------------------------------------------------------------------------------ No results for input(s): HGBA1C in the last 72 hours. ------------------------------------------------------------------------------------------------------------------ No results for input(s): CHOL, HDL, LDLCALC, TRIG, CHOLHDL, LDLDIRECT in the last 72 hours. ------------------------------------------------------------------------------------------------------------------ No results for input(s): TSH, T4TOTAL, T3FREE, THYROIDAB in the last 72 hours.  Invalid input(s): FREET3 ------------------------------------------------------------------------------------------------------------------ No results for input(s): VITAMINB12, FOLATE, FERRITIN, TIBC, IRON, RETICCTPCT in the last 72 hours.  Coagulation profile  Recent Labs Lab 01/01/16 0733 01/02/16 0513 01/03/16 0420 01/04/16 0224 01/05/16 0516  INR 1.18 1.26 1.25 1.57* 1.90*    No results for input(s): DDIMER in the last 72 hours.  Cardiac Enzymes  Recent Labs Lab  12/30/15 1611  TROPONINI 0.31*   ------------------------------------------------------------------------------------------------------------------ Invalid input(s): POCBNP    Anokhi Shannon D.O. on 01/05/2016 at 11:03 AM  Between 7am to 7pm - Pager - [phone]  After 7pm go to [email or web address] - password [email or web address]  And look for the night coverage person covering for me after hours  [facility] Office  [phone]

## 2016-01-05 NOTE — Progress Notes (Signed)
SUBJECTIVE:  Feels well.  No complaints. Walked without shortness of breath   PHYSICAL EXAM Filed Vitals:   01/04/16 0442 01/04/16 1243 01/04/16 2005 01/05/16 0520  BP: 147/100 130/84 127/88 131/100  Pulse: 120 118 121 121  Temp: 97.5 F (36.4 C) 97.5 F (36.4 C) 97.5 F (36.4 C) 97.6 F (36.4 C)  TempSrc: Oral Oral Oral Oral  Resp: 18 20 20 20   Height:      Weight: 91.128 kg (200 lb 14.4 oz)   88.724 kg (195 lb 9.6 oz)  SpO2: 97% 98% 97% 97%   General:  Well-appearing.  No acute distress Neck: No JVD Lungs:  CTAB.  No crackles, rhonchi or wheezes. Heart:  Tachycardic.  Regular rhythm.  No m/r/g Abdomen:  Soft, NT, ND.  +BS  Extremities:  WWP.  No edema.  LABS: Lab Results  Component Value Date   TROPONINI 0.31* 12/30/2015   Results for orders placed or performed during the hospital encounter of 12/28/15 (from the past 24 hour(s))  Glucose, capillary     Status: Abnormal   Collection Time: 01/04/16  5:01 PM  Result Value Ref Range   Glucose-Capillary 127 (H) 65 - 99 mg/dL   Comment 1 Notify RN   Glucose, capillary     Status: Abnormal   Collection Time: 01/04/16  9:43 PM  Result Value Ref Range   Glucose-Capillary 110 (H) 65 - 99 mg/dL  Heparin level (unfractionated)     Status: None   Collection Time: 01/05/16  5:16 AM  Result Value Ref Range   Heparin Unfractionated 0.54 0.30 - 0.70 IU/mL  CBC     Status: Abnormal   Collection Time: 01/05/16  5:16 AM  Result Value Ref Range   WBC 6.2 4.0 - 10.5 K/uL   RBC 4.52 4.22 - 5.81 MIL/uL   Hemoglobin 12.9 (L) 13.0 - 17.0 g/dL   HCT 86.7 54.4 - 92.0 %   MCV 88.9 78.0 - 100.0 fL   MCH 28.5 26.0 - 34.0 pg   MCHC 32.1 30.0 - 36.0 g/dL   RDW 10.0 71.2 - 19.7 %   Platelets 133 (L) 150 - 400 K/uL  Basic metabolic panel     Status: Abnormal   Collection Time: 01/05/16  5:16 AM  Result Value Ref Range   Sodium 140 135 - 145 mmol/L   Potassium 4.1 3.5 - 5.1 mmol/L   Chloride 102 101 - 111 mmol/L   CO2 26 22 - 32  mmol/L   Glucose, Bld 113 (H) 65 - 99 mg/dL   BUN 27 (H) 6 - 20 mg/dL   Creatinine, Ser 5.88 (H) 0.61 - 1.24 mg/dL   Calcium 9.5 8.9 - 32.5 mg/dL   GFR calc non Af Amer 53 (L) >60 mL/min   GFR calc Af Amer >60 >60 mL/min   Anion gap 12 5 - 15  Protime-INR     Status: Abnormal   Collection Time: 01/05/16  5:16 AM  Result Value Ref Range   Prothrombin Time 21.7 (H) 11.6 - 15.2 seconds   INR 1.90 (H) 0.00 - 1.49  Glucose, capillary     Status: Abnormal   Collection Time: 01/05/16  6:38 AM  Result Value Ref Range   Glucose-Capillary 108 (H) 65 - 99 mg/dL    Intake/Output Summary (Last 24 hours) at 01/05/16 1114 Last data filed at 01/05/16 0907  Gross per 24 hour  Intake    720 ml  Output   3995 ml  Net  -  3275 ml    Telemetry: Atrial flutter.  Rate ~120 bpm.  ASSESSMENT AND PLAN:  Principal Problem:   Acute on chronic systolic heart failure (HCC) Active Problems:   Swelling of lower extremity   Depression   Type 2 diabetes, uncontrolled, with renal manifestation (HCC)   CKD (chronic kidney disease), stage II   Obesity (BMI 30-39.9)   HTN (hypertension)   Atrial flutter (HCC)   Elevated troponin   Psychiatric diagnosis deferred   Hypertensive heart disease with heart failure (HCC)   Acute on chronic systolic and diastolic heart failure, NYHA class 3 (HCC)   # Acute on chronic systolic heart failure:  Euvolemic on exam.  LVEF 20-25% with diffuse hypokinesis. There is akinesis of the basal-mid anteroseptal and apical anteriormyocardium. There is akinesis of the basalinferior myocardium. LA severely dilated. PA pressure . Continue digoxin, losartan, and lasix. - Metoprolol XL increased to 75 mg today given poorly-controlled BP  # Atrial flutter with RVR:  Rates remain in the 120s.  TEE with DCCV tomorrow at 2 pm.  INR nearly therapeutic.  Will likely be able to stop heparin tomorrow.  # Acute on chronic renal failure: Thought to be due to medication non-compliance.     # Hypertensive heart disease: BP poorly-controlled.  Increasing metoprolol as above.  # Elevated troponin: Thought to be demand ischemia.  No chest pain.  Time spent: 25 minutes-Greater than 50% of this time was spent in counseling, explanation of diagnosis, planning of further management, and coordination of care.    [name] Salvia, MD, [facility] 01/05/2016 11:14 AM

## 2016-01-06 ENCOUNTER — Inpatient Hospital Stay (HOSPITAL_COMMUNITY): Payer: Medicare Other

## 2016-01-06 ENCOUNTER — Encounter (HOSPITAL_COMMUNITY)
Admission: EM | Disposition: A | Discharge status: Home / Self Care | Payer: Self-pay | Source: Home / Self Care | Attending: Internal Medicine

## 2016-01-06 ENCOUNTER — Inpatient Hospital Stay (HOSPITAL_COMMUNITY): Payer: Medicare Other | Admitting: Anesthesiology

## 2016-01-06 ENCOUNTER — Encounter (HOSPITAL_COMMUNITY): Payer: Self-pay | Admitting: Certified Registered Nurse Anesthetist

## 2016-01-06 DIAGNOSIS — I4892 Unspecified atrial flutter: Secondary | ICD-10-CM

## 2016-01-06 DIAGNOSIS — Z7901 Long term (current) use of anticoagulants: Secondary | ICD-10-CM

## 2016-01-06 HISTORY — PX: TEE WITHOUT CARDIOVERSION: SHX5443

## 2016-01-06 LAB — CBC
HCT: 41 % (ref 39.0–52.0)
HEMOGLOBIN: 12.3 g/dL — AB (ref 13.0–17.0)
MCH: 27.2 pg (ref 26.0–34.0)
MCHC: 30 g/dL (ref 30.0–36.0)
MCV: 90.7 fL (ref 78.0–100.0)
PLATELETS: 135 10*3/uL — AB (ref 150–400)
RBC: 4.52 MIL/uL (ref 4.22–5.81)
RDW: 15 % (ref 11.5–15.5)
WBC: 6.3 10*3/uL (ref 4.0–10.5)

## 2016-01-06 LAB — BASIC METABOLIC PANEL
ANION GAP: 9 (ref 5–15)
BUN: 30 mg/dL — ABNORMAL HIGH (ref 6–20)
CALCIUM: 9 mg/dL (ref 8.9–10.3)
CO2: 27 mmol/L (ref 22–32)
CREATININE: 1.38 mg/dL — AB (ref 0.61–1.24)
Chloride: 103 mmol/L (ref 101–111)
GFR, EST NON AFRICAN AMERICAN: 52 mL/min — AB (ref >60)
Glucose, Bld: 119 mg/dL — ABNORMAL HIGH (ref 65–99)
Potassium: 3.9 mmol/L (ref 3.5–5.1)
Sodium: 139 mmol/L (ref 135–145)

## 2016-01-06 LAB — GLUCOSE, CAPILLARY
GLUCOSE-CAPILLARY: 118 mg/dL — AB (ref 65–99)
GLUCOSE-CAPILLARY: 142 mg/dL — AB (ref 65–99)
Glucose-Capillary: 142 mg/dL — ABNORMAL HIGH (ref 65–99)
Glucose-Capillary: 118 mg/dL — ABNORMAL HIGH (ref 65–99)

## 2016-01-06 LAB — PROTIME-INR
INR: 2.07 — AB (ref 0.00–1.49)
Prothrombin Time: 23.1 seconds — ABNORMAL HIGH (ref 11.6–15.2)

## 2016-01-06 LAB — HEPARIN LEVEL (UNFRACTIONATED): HEPARIN UNFRACTIONATED: 0.62 [IU]/mL (ref 0.30–0.70)

## 2016-01-06 SURGERY — ECHOCARDIOGRAM, TRANSESOPHAGEAL
Anesthesia: Monitor Anesthesia Care

## 2016-01-06 MED ORDER — METOPROLOL SUCCINATE ER 50 MG PO TB24
50.0000 mg | ORAL_TABLET | Freq: Two times a day (BID) | ORAL | Status: DC
  Administered 2016-01-06 – 2016-01-07 (×2): 50 mg via ORAL
  Filled 2016-01-06 (×2): qty 1

## 2016-01-06 MED ORDER — PROPOFOL 500 MG/50ML IV EMUL
INTRAVENOUS | Status: DC | PRN
  Administered 2016-01-06: 100 ug/kg/min via INTRAVENOUS

## 2016-01-06 MED ORDER — PROPOFOL 10 MG/ML IV BOLUS
INTRAVENOUS | Status: DC | PRN
  Administered 2016-01-06: 30 mg via INTRAVENOUS

## 2016-01-06 MED ORDER — WARFARIN SODIUM 10 MG PO TABS
10.0000 mg | ORAL_TABLET | ORAL | Status: AC
  Administered 2016-01-06: 10 mg via ORAL
  Filled 2016-01-06: qty 1

## 2016-01-06 MED ORDER — LIDOCAINE VISCOUS 2 % MT SOLN
OROMUCOSAL | Status: DC | PRN
  Administered 2016-01-06: 20 mL via OROMUCOSAL

## 2016-01-06 MED ORDER — LIDOCAINE HCL (CARDIAC) 20 MG/ML IV SOLN
INTRAVENOUS | Status: DC | PRN
  Administered 2016-01-06: 40 mg via INTRATRACHEAL

## 2016-01-06 MED ORDER — LIDOCAINE VISCOUS 2 % MT SOLN
OROMUCOSAL | Status: AC
  Filled 2016-01-06: qty 15

## 2016-01-06 NOTE — Progress Notes (Signed)
  Echocardiogram Echocardiogram Transesophageal has been performed.  Janalyn Harder 01/06/2016, 2:29 PM

## 2016-01-06 NOTE — Care Management Important Message (Signed)
Important Message  Patient Details  Name: [name] MRN: [id number] Date of Birth: Jun 23, 1951   Medicare Important Message Given:  Yes    Oralia Rud Roya Gieselman 01/06/2016, 4:42 PM

## 2016-01-06 NOTE — Progress Notes (Signed)
ANTICOAGULATION CONSULT NOTE - Follow Up Consult  Pharmacy Consult for Heparin Indication: Afib/flutter  No Known Allergies  Patient Measurements: Height: 5\' 8"  (172.7 cm) Weight: 197 lb 3.2 oz (89.449 kg) (b scale) IBW/kg (Calculated) : 68.4  Vital Signs: Temp: 97.1 F (36.2 C) (02/27 0646) Temp Source: Oral (02/27 0646) BP: 110/80 mmHg (02/27 0646) Pulse Rate: 102 (02/27 0646)  Labs:  Recent Labs  01/04/16 0224 01/05/16 0516 01/05/16 1154 01/06/16 0340  HGB 12.0* 12.9*  --  12.3*  HCT 39.4 40.2  --  41.0  PLT 124* 133*  --  135*  LABPROT 18.8* 21.7*  --  23.1*  INR 1.57* 1.90*  --  2.07*  HEPARINUNFRC 0.36 0.54  --  0.62  CREATININE 1.50* 1.37* 1.39* 1.38*    Estimated Creatinine Clearance: 58 mL/min (by C-G formula based on Cr of 1.38).   Assessment: 65 yo male with history of Afib, not taking his warfarin PTA, presents with edema. Pharmacy is consulted to dose heparin for atrial flutter. Pt reports that he doesn't take any of his home meds.  PMH: DM2, CKD3, Obestity, edema, HTN, CHF, OSA, Afib (noncompliant w/ warfarin)  AC: Heparin for aflutter. PTA warfarin but pt not taking?  Discussed with MD over weekend about noncompliance with all meds and plans to continue [facility]? CHADSVASC 4.  Heparin level 0.62. INR 2.07. CBC stable.  Goal of Therapy:  INR 2-3 Monitor platelets by anticoagulation protocol: Yes   Plan:  D/C IV heparin with INR>2. TEE with DCCV today at 2 pm Coumadin 10 mg po x 1 again today this AM due to borderline INR prior to CV. Daily INR   Kimyah Frein S. Merilynn Finland, PharmD, BCPS Clinical Staff Pharmacist Pager [phone]  Misty Stanley Stillinger 01/06/2016,8:31 AM

## 2016-01-06 NOTE — H&P (View-Only) (Signed)
SUBJECTIVE:  Feels well.  No complaints. Walked without shortness of breath   PHYSICAL EXAM Filed Vitals:   01/04/16 0442 01/04/16 1243 01/04/16 2005 01/05/16 0520  BP: 147/100 130/84 127/88 131/100  Pulse: 120 118 121 121  Temp: 97.5 F (36.4 C) 97.5 F (36.4 C) 97.5 F (36.4 C) 97.6 F (36.4 C)  TempSrc: Oral Oral Oral Oral  Resp: 18 20 20 20   Height:      Weight: 91.128 kg (200 lb 14.4 oz)   88.724 kg (195 lb 9.6 oz)  SpO2: 97% 98% 97% 97%   General:  Well-appearing.  No acute distress Neck: No JVD Lungs:  CTAB.  No crackles, rhonchi or wheezes. Heart:  Tachycardic.  Regular rhythm.  No m/r/g Abdomen:  Soft, NT, ND.  +BS  Extremities:  WWP.  No edema.  LABS: Lab Results  Component Value Date   TROPONINI 0.31* 12/30/2015   Results for orders placed or performed during the hospital encounter of 12/28/15 (from the past 24 hour(s))  Glucose, capillary     Status: Abnormal   Collection Time: 01/04/16  5:01 PM  Result Value Ref Range   Glucose-Capillary 127 (H) 65 - 99 mg/dL   Comment 1 Notify RN   Glucose, capillary     Status: Abnormal   Collection Time: 01/04/16  9:43 PM  Result Value Ref Range   Glucose-Capillary 110 (H) 65 - 99 mg/dL  Heparin level (unfractionated)     Status: None   Collection Time: 01/05/16  5:16 AM  Result Value Ref Range   Heparin Unfractionated 0.54 0.30 - 0.70 IU/mL  CBC     Status: Abnormal   Collection Time: 01/05/16  5:16 AM  Result Value Ref Range   WBC 6.2 4.0 - 10.5 K/uL   RBC 4.52 4.22 - 5.81 MIL/uL   Hemoglobin 12.9 (L) 13.0 - 17.0 g/dL   HCT 86.7 54.4 - 92.0 %   MCV 88.9 78.0 - 100.0 fL   MCH 28.5 26.0 - 34.0 pg   MCHC 32.1 30.0 - 36.0 g/dL   RDW 10.0 71.2 - 19.7 %   Platelets 133 (L) 150 - 400 K/uL  Basic metabolic panel     Status: Abnormal   Collection Time: 01/05/16  5:16 AM  Result Value Ref Range   Sodium 140 135 - 145 mmol/L   Potassium 4.1 3.5 - 5.1 mmol/L   Chloride 102 101 - 111 mmol/L   CO2 26 22 - 32  mmol/L   Glucose, Bld 113 (H) 65 - 99 mg/dL   BUN 27 (H) 6 - 20 mg/dL   Creatinine, Ser 5.88 (H) 0.61 - 1.24 mg/dL   Calcium 9.5 8.9 - 32.5 mg/dL   GFR calc non Af Amer 53 (L) >60 mL/min   GFR calc Af Amer >60 >60 mL/min   Anion gap 12 5 - 15  Protime-INR     Status: Abnormal   Collection Time: 01/05/16  5:16 AM  Result Value Ref Range   Prothrombin Time 21.7 (H) 11.6 - 15.2 seconds   INR 1.90 (H) 0.00 - 1.49  Glucose, capillary     Status: Abnormal   Collection Time: 01/05/16  6:38 AM  Result Value Ref Range   Glucose-Capillary 108 (H) 65 - 99 mg/dL    Intake/Output Summary (Last 24 hours) at 01/05/16 1114 Last data filed at 01/05/16 0907  Gross per 24 hour  Intake    720 ml  Output   3995 ml  Net  -  3275 ml    Telemetry: Atrial flutter.  Rate ~120 bpm.  ASSESSMENT AND PLAN:  Principal Problem:   Acute on chronic systolic heart failure (HCC) Active Problems:   Swelling of lower extremity   Depression   Type 2 diabetes, uncontrolled, with renal manifestation (HCC)   CKD (chronic kidney disease), stage II   Obesity (BMI 30-39.9)   HTN (hypertension)   Atrial flutter (HCC)   Elevated troponin   Psychiatric diagnosis deferred   Hypertensive heart disease with heart failure (HCC)   Acute on chronic systolic and diastolic heart failure, NYHA class 3 (HCC)   # Acute on chronic systolic heart failure:  Euvolemic on exam.  LVEF 20-25% with diffuse hypokinesis. There is akinesis of the basal-mid anteroseptal and apical anteriormyocardium. There is akinesis of the basalinferior myocardium. LA severely dilated. PA pressure 51mmHg. Continue digoxin, losartan, and lasix. - Metoprolol XL increased to 75 mg today given poorly-controlled BP  # Atrial flutter with RVR:  Rates remain in the 120s.  TEE with DCCV tomorrow at 2 pm.  INR nearly therapeutic.  Will likely be able to stop heparin tomorrow.  # Acute on chronic renal failure: Thought to be due to medication non-compliance.     # Hypertensive heart disease: BP poorly-controlled.  Increasing metoprolol as above.  # Elevated troponin: Thought to be demand ischemia.  No chest pain.  Time spent: 25 minutes-Greater than 50% of this time was spent in counseling, explanation of diagnosis, planning of further management, and coordination of care.    Kingdom Vanzanten C. [place], MD, FACC 01/05/2016 11:14 AM  

## 2016-01-06 NOTE — Progress Notes (Signed)
Patient Name: [name] Date of Encounter: 01/06/2016  Primary Cardiologist Dr. Gala Romney   Principal Problem:   Acute on chronic systolic heart failure [facility]) Active Problems:   Swelling of lower extremity   Depression   Type 2 diabetes, uncontrolled, with renal manifestation (HCC)   CKD (chronic kidney disease), stage II   Obesity (BMI 30-39.9)   HTN (hypertension)   Atrial flutter (HCC)   Elevated troponin   Psychiatric diagnosis deferred   Hypertensive heart disease with heart failure (HCC)   Acute on chronic systolic and diastolic heart failure, NYHA class 3 (HCC)    SUBJECTIVE  Denies any CP or SOB.   CURRENT MEDS . ARIPiprazole  2 mg Oral BID  . digoxin  0.125 mg Oral Daily  . escitalopram  10 mg Oral Daily  . furosemide  80 mg Oral BID  . insulin aspart  0-15 Units Subcutaneous TID WC  . insulin aspart  0-5 Units Subcutaneous QHS  . losartan  25 mg Oral Daily  . metoprolol succinate  75 mg Oral Daily  . potassium chloride  40 mEq Oral Daily  . sodium chloride flush  3 mL Intravenous Q12H  . sodium chloride flush  3 mL Intravenous Q12H  . Warfarin - Pharmacist Dosing Inpatient   Does not apply q1800    OBJECTIVE  Filed Vitals:   01/05/16 1335 01/05/16 2325 01/06/16 0646 01/06/16 1126  BP: 113/72 108/64 110/80 121/98  Pulse: 63 100 102 119  Temp: 97.3 F (36.3 C) 98.4 F (36.9 C) 97.1 F (36.2 C) 98.1 F (36.7 C)  TempSrc: Oral Oral Oral Oral  Resp: 20 18 18 16   Height:      Weight:   197 lb 3.2 oz (89.449 kg)   SpO2: 96% 99% 97% 98%    Intake/Output Summary (Last 24 hours) at 01/06/16 1305 Last data filed at 01/06/16 1042  Gross per 24 hour  Intake 3160.47 ml  Output   1425 ml  Net 1735.47 ml   Filed Weights   01/04/16 0442 01/05/16 0520 01/06/16 0646  Weight: 200 lb 14.4 oz (91.128 kg) 195 lb 9.6 oz (88.724 kg) 197 lb 3.2 oz (89.449 kg)    PHYSICAL EXAM  General: Pleasant, NAD. Neuro: Alert and oriented X 3. Moves all  extremities spontaneously. Psych: Normal affect. HEENT:  Normal  Neck: Supple without bruits or JVD. Lungs:  Resp regular and unlabored, CTA. Heart: irregular. no s3, s4, or murmurs. Abdomen: Soft, non-tender, non-distended, BS + x 4.  Extremities: No clubbing, cyanosis or edema. DP/PT/Radials 2+ and equal bilaterally.  Accessory Clinical Findings  CBC  Recent Labs  01/05/16 0516 01/06/16 0340  WBC 6.2 6.3  HGB 12.9* 12.3*  HCT 40.2 41.0  MCV 88.9 90.7  PLT 133* 135*   Basic Metabolic Panel  Recent Labs  01/05/16 1154 01/06/16 0340  NA 139 139  K 4.4 3.9  CL 101 103  CO2 28 27  GLUCOSE 119* 119*  BUN 27* 30*  CREATININE 1.39* 1.38*  CALCIUM 9.4 9.0  MG 2.3  --     TELE afib with HR 110s    ECG  No new EKG  Echocardiogram 12/30/2015  LV EF: 20% -  25%  ------------------------------------------------------------------- Indications:   CHF - 428.0.  ------------------------------------------------------------------- Study Conclusions  - Left ventricle: The cavity size was severely dilated. Systolic function was severely reduced. The estimated ejection fraction was in the range of 20% to 25%. Severe diffuse hypokinesis. There is akinesis of  the basal-mid anteroseptal and apical anterior myocardium. There is akinesis of the basalinferior myocardium. There is akinesis of the entireanterior myocardium. The tissue Doppler parameters were abnormal. The study is not technically sufficient to allow evaluation of LV diastolic function. - Aortic valve: Moderate thickening and calcification, consistent with sclerosis. - Mitral valve: There was mild regurgitation. - Left atrium: The atrium was severely dilated. - Right ventricle: Systolic function was moderately to severely reduced. - Tricuspid valve: There was mild regurgitation. - Pulmonary arteries: PA peak pressure: 51 mm Hg (S).  Impressions:  - The right ventricular systolic  pressure was increased consistent with moderate pulmonary hypertension.    Radiology/Studies  Dg Chest 2 View  12/28/2015  CLINICAL DATA:  Acute chest pain shortness of breath, diabetes, hypertension EXAM: CHEST  2 VIEW COMPARISON:  05/08/2014 FINDINGS: Stable cardiomegaly with slight vascular congestion. Minor streaky bibasilar densities and trace pleural effusions. No definite focal pneumonia. No current CHF or pneumothorax. Trachea midline. Degenerative changes of the spine. IMPRESSION: cardiomegaly with trace pleural effusions and bibasilar atelectasis. Electronically Signed   By: Judie Petit.  Shick M.D.   On: 12/28/2015 12:45   Nm Myocar Multi W/spect W/wall Motion / Ef  12/31/2015  CLINICAL DATA:  Atrial fibrillation, CHF.  Diabetes, hypertension. EXAM: MYOCARDIAL IMAGING WITH SPECT (REST AND PHARMACOLOGIC-STRESS) GATED LEFT VENTRICULAR WALL MOTION STUDY LEFT VENTRICULAR EJECTION FRACTION TECHNIQUE: Standard myocardial SPECT imaging was performed after resting intravenous injection of 10 mCi Tc-74m sestamibi. Subsequently, intravenous infusion of Lexiscan was performed under the supervision of the Cardiology staff. At peak effect of the drug, 30 mCi Tc-22m sestamibi was injected intravenously and standard myocardial SPECT imaging was performed. Quantitative gated imaging was also performed to evaluate left ventricular wall motion, and estimate left ventricular ejection fraction. COMPARISON:  None. FINDINGS: Perfusion: Next defects noted on rest and stress images within the inferior wall and to lesser extent the septum. No reversible defects. Wall Motion: Severe generalized hypokinesia. Left ventricle is dilute Left Ventricular Ejection Fraction: 20 % End diastolic volume 218 ml End systolic volume 173 ml IMPRESSION: 1. 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 No evidence of ischemia. 2. Severe generalized hypokinesia with dilated left ventricle. 3. Left ventricular ejection  fraction 20% 4. High-risk stress test findings*. *2012 Appropriate Use Criteria for Coronary Revascularization Focused Update: J Am Coll Cardiol. 2012;59(9):857-881. http://content.dementiazones.com.aspx?articleid=[email or web address] Electronically Signed   By: Charlett Nose M.D.   On: 12/31/2015 14:09    ASSESSMENT AND PLAN  1. Acute on chronic systolic HF - Echo 05/09/2014 EF 25-30%, moderate LVH, peak PA pressure  - New echo: EF 20-25%, diffuse hypokinesis. There is akinesis of the basal-mid anteroseptal and apical anteriormyocardium. There is akinesis of the basalinferior myocardium. LA severely dilated. PA pressure .  - transitioned to PO lasix on 2/21, currently on 80mg  BID lasix, Cr stable. Euvolemic on exam today.   2. Atrial flutter with RVR  - CHA2DS2-Vasc score 4 (HTN, DM, age, HF), coumadin restarted, INR therapeutic  - s/p TEE on 2/27, positive for LAA thrombus, DDCV cancelled  - controlling his HR has been difficult, currently on 75mg  daily of Toprol XL and 25mg  daily of losartan, will increase Toprol XL to 50mg  BID for more even rate control. May need to hold losartan if necessary if HR drop too low. Likely discharge once HR improve, given his EF, would be happy if his HR stay <110 most of the time.   3. Elevated trop with new LV dysfunction on  echo - likely due to volume overload and renal issue  - Echo 12/30/2015 EF 20-25%, severe diffuse hypokinesis, akinesis of basal-mid anteroseptal and apical anterior myocardium, mild MR, severely dilated LA, mild TR, PA peak pressure  - myoview 12/31/2015 old infarct/scar in the inferior wall, no evidence of ischemia, EF 20%, high risk stress test finding    4. Acute on chronic renal insufficiency - baseline Cr 1.1 in 05/2014, now Cr 1.6-1.7, unclear how much of this is acute on chronic. Likely related to noncompliance with medication  5. OSA 6. CKD 7. DM 8. H/o afib 9. Psychosocial  issue: Per hospital team. His chronic management will not be successful without these issues being addressed. He needs a psychiatry consult.IM called psych consult    Signed, Amedeo Plenty Pager: [phone]  Patient seen and examined. Agree with assessment and plan. Back from TEE.  Significant LA thrombus and smoke; therefore, DCC not done. EF 10 - 15%. Appears well compensated.  INR 2.07; will need to continue with anticoagulation for several months prior to re-attempt. Agree with changing Toprol XL to 50 mg bid.  Gradually titrate losartan to 25 mg bid as BP allows.   Lennette Bihari, MD, [facility] 01/06/2016 4:58 PM

## 2016-01-06 NOTE — Op Note (Addendum)
Cardioversion cancelled due to thrombus in LA appendage

## 2016-01-06 NOTE — Interval H&P Note (Signed)
History and Physical Interval Note:  01/06/2016 9:35 AM  [name]  has presented today for surgery, with the diagnosis of A FIB  The various methods of treatment have been discussed with the patient and family. After consideration of risks, benefits and other options for treatment, the patient has consented to  Procedure(s): TRANSESOPHAGEAL ECHOCARDIOGRAM (TEE) (N/A) CARDIOVERSION (N/A) as a surgical intervention .  The patient's history has been reviewed, patient examined, no change in status, stable for surgery.  I have reviewed the patient's chart and labs.  Questions were answered to the patient's satisfaction.     Dietrich Pates

## 2016-01-06 NOTE — Transfer of Care (Signed)
Immediate Anesthesia Transfer of Care Note  Patient: [name]  Procedure(s) Performed: Procedure(s): TRANSESOPHAGEAL ECHOCARDIOGRAM (TEE) (N/A)  Patient Location: Endoscopy unit  Anesthesia Type:MAC  Level of Consciousness: patient cooperative and responds to stimulation, drowsy  Airway & Oxygen Therapy: Patient Spontanous Breathing and Patient connected to nasal cannula oxygen  Post-op Assessment: Report given to RN and Post -op Vital signs reviewed and stable  Post vital signs: Reviewed and stable  Last Vitals:  Filed Vitals:   01/06/16 1340 01/06/16 1435  BP:  125/70  Pulse: 119 115  Temp: 36.4 C 36.7 C  Resp: 15 10    Complications: No apparent anesthesia complications

## 2016-01-06 NOTE — Anesthesia Postprocedure Evaluation (Signed)
Anesthesia Post Note  Patient: Perfecto Bur Engdahl  Procedure(s) Performed: Procedure(s) (LRB): TRANSESOPHAGEAL ECHOCARDIOGRAM (TEE) (N/A)  Patient location during evaluation: PACU Anesthesia Type: General Level of consciousness: awake and alert Pain management: pain level controlled Vital Signs Assessment: post-procedure vital signs reviewed and stable Respiratory status: spontaneous breathing, nonlabored ventilation and respiratory function stable Cardiovascular status: blood pressure returned to baseline and stable Postop Assessment: no signs of nausea or vomiting Anesthetic complications: no    Last Vitals:  Filed Vitals:   01/06/16 1445 01/06/16 1513  BP: 128/84 119/82  Pulse: 118 116  Temp:    Resp: 14 16    Last Pain:  Filed Vitals:   01/06/16 1513  PainSc: 0-No pain                 Burnard Enis A

## 2016-01-06 NOTE — Progress Notes (Signed)
[facility]                                                                              Patient Demographics  [name], is a 65 y.o. male, DOB - 1951-05-04, QMV:[id number]  Admit date - 12/28/2015   Admitting Physician Calvert Cantor, MD  Outpatient Primary MD for the patient is [name], MD  LOS - 9   Chief Complaint  Patient presents with  . Leg Swelling      HPI on 12/28/2015 by [name], NP [name] is a 65 y.o. male with a past medical history that includes type 2 diabetes uncontrolled, chronic kidney disease stage III, obesity, lower extremity edema, hypertension, chronic systolic heart failure, obstructive sleep apnea A. fib noncompliant with Coumadin presents to the emergency department with chief complaint of worsening lower extremity edema. Initial evaluation reveals acute on chronic systolic heart failure with an elevated troponin. Information is obtained from the patient. He reports noncompliance with medication and follow-up medical care since 2015. He states he came to the emergency department when his legs swelled to the point that it caused him pain and made it difficult to walk. He denies any chest pain palpitations headache dizziness syncope or near-syncope. He denies orthopnea cough nausea vomiting. He denies dysuria hematuria frequency or urgency. 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, no evidence of ischemia, severe generalized hypokinesia with EF of 20% -Continue diuresis, patient was placed on 80 of Lasix BID -Continue metoprolol, losartan  Atrial fibrillation with  RVR -Patient had TEE DCCV July 2015, has been on Coumadin past however was not compliant -Continue digoxin, metoprolol -Continue Coumadin and heparin -Plan for TEE and DCCV today 01/06/2016 (there were some scheduling issues on 2/24) -INR 2.07 today -Spoke with patient regarding taking coumadin at discharge, he explained to me that he felt he was a "65 year old stuck in a 65 year old body" and did know if he would take all of these medications  Elevated troponin -Likely secondary to the above -Peak 0.48, consistent with demand ischemia in the setting of CHF and he flutter with RVR  Essential hypertension -Continue Lasix -Patient has been noncompliant, was supposed to be on Coreg, Lasix, lisinopril however has not taken these medications in over a year  Acute on chronic kidney disease, stage III -Creatinine 1.6 on admission, currently 1.38 -Baseline creatinine 1.1 -Continue to monitor BMP  Depression/psychosocial issues/noncompliance -Psychiatry consulted and appreciated-did not feel patient was candidate for inpatient psych admission, recommended Lexapro and Abilify  Diabetes mellitus, type II -Hemoglobin A1c 6 -Metformin held -Continue insulin sliding scale a CBG monitoring  Code Status: Full  Family Communication: None at bedside.  Disposition Plan: Admitted. Pending DCCV/TEE today 01/06/2016  Time Spent in minutes   30 minutes  Procedures  Echocardiogram Stress test  Consults   Cardiology Psychiatry  DVT Prophylaxis  Heparin/Couamdin  Lab Results  Component Value Date   PLT 135* 01/06/2016    Medications  Scheduled Meds: . [MAR Hold] ARIPiprazole  2 mg Oral BID  . [MAR Hold] digoxin  0.125 mg Oral Daily  . [MAR Hold] escitalopram  10 mg Oral Daily  . [MAR Hold] furosemide  80 mg Oral BID  . [MAR Hold] insulin aspart  0-15 Units Subcutaneous TID WC  . [MAR Hold] insulin aspart  0-5 Units Subcutaneous QHS  . [MAR Hold] losartan  25 mg Oral Daily  . [MAR  Hold] metoprolol succinate  75 mg Oral Daily  . [MAR Hold] potassium chloride  40 mEq Oral Daily  . [MAR Hold] sodium chloride flush  3 mL Intravenous Q12H  . [MAR Hold] sodium chloride flush  3 mL Intravenous Q12H  . [MAR Hold] Warfarin - Pharmacist Dosing Inpatient   Does not apply q1800   Continuous Infusions: . sodium chloride 250 mL (01/05/16 1100)   PRN Meds:.[MAR Hold] sodium chloride, [MAR Hold] acetaminophen, [MAR Hold] hydrocortisone cream, [MAR Hold] hydrOXYzine, [MAR Hold] ondansetron (ZOFRAN) IV, [MAR Hold] sodium chloride flush, [MAR Hold] sodium chloride flush  Antibiotics    Anti-infectives    None      Subjective:   [name] seen and examined today.  Patient has no complaints this morning.  Wishes to go home soon.  Denies any chest pain, abdominal pain, nausea vomiting, dizziness or headache.  Objective:   Filed Vitals:   01/05/16 1335 01/05/16 2325 01/06/16 0646 01/06/16 1126  BP: 113/72 108/64 110/80 121/98  Pulse: 63 100 102 119  Temp: 97.3 F (36.3 C) 98.4 F (36.9 C) 97.1 F (36.2 C) 98.1 F (36.7 C)  TempSrc: Oral Oral Oral Oral  Resp: 20 18 18 16   Height:      Weight:   89.449 kg (197 lb 3.2 oz)   SpO2: 96% 99% 97% 98%    Wt Readings from Last 3 Encounters:  01/06/16 89.449 kg (197 lb 3.2 oz)  06/19/14 96.333 kg (212 lb 6 oz)  05/21/14 103.874 kg (229 lb)     Intake/Output Summary (Last 24 hours) at 01/06/16 1326 Last data filed at 01/06/16 1042  Gross per 24 hour  Intake 3160.47 ml  Output   1425 ml  Net 1735.47 ml    Exam  General: Well developed, well nourished, NAD  HEENT: NCAT,  mucous membranes moist.   Cardiovascular: S1 S2 auscultated, tachycardic  Respiratory: Clear to auscultation bilaterally   Abdomen: Soft, nontender, nondistended, + bowel sounds  Extremities: warm dry without cyanosis clubbing. +trace LE edema  Neuro: AAOx3, nonfocal   Psych: Flat affect  Data Review   Micro Results No results  found for this or any previous visit (from the past 240 hour(s)).  Radiology Reports Dg Chest 2 View  12/28/2015  CLINICAL DATA:  Acute chest pain shortness of breath, diabetes, hypertension EXAM: CHEST  2 VIEW COMPARISON:  05/08/2014 FINDINGS: Stable cardiomegaly with slight vascular congestion. Minor streaky bibasilar densities and trace pleural effusions. No definite focal pneumonia. No current CHF or pneumothorax. Trachea midline. Degenerative changes of the spine. IMPRESSION: cardiomegaly with trace pleural effusions and bibasilar atelectasis. Electronically Signed   By: Judie Petit.  Shick M.D.   On: 12/28/2015 12:45   Nm Myocar Multi W/spect W/wall Motion / Ef  12/31/2015  CLINICAL DATA:  Atrial fibrillation, CHF.  Diabetes, hypertension. EXAM: MYOCARDIAL IMAGING WITH SPECT (REST AND PHARMACOLOGIC-STRESS) GATED LEFT VENTRICULAR WALL MOTION STUDY LEFT VENTRICULAR EJECTION FRACTION TECHNIQUE: Standard myocardial  SPECT imaging was performed after resting intravenous injection of 10 mCi Tc-21m sestamibi. Subsequently, intravenous infusion of Lexiscan was performed under the supervision of the Cardiology staff. At peak effect of the drug, 30 mCi Tc-30m sestamibi was injected intravenously and standard myocardial SPECT imaging was performed. Quantitative gated imaging was also performed to evaluate left ventricular wall motion, and estimate left ventricular ejection fraction. COMPARISON:  None. FINDINGS: Perfusion: Next defects noted on rest and stress images within the inferior wall and to lesser extent the septum. No reversible defects. Wall Motion: Severe generalized hypokinesia. Left ventricle is dilute Left Ventricular Ejection Fraction: 20 % End diastolic volume 218 ml End systolic volume 173 ml IMPRESSION: 1. 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 No evidence of ischemia. 2. Severe generalized hypokinesia with dilated left ventricle. 3. Left ventricular ejection fraction 20% 4.  High-risk stress test findings*. *2012 Appropriate Use Criteria for Coronary Revascularization Focused Update: J Am Coll Cardiol. 2012;59(9):857-881. http://content.dementiazones.com.aspx?articleid=1201161 Electronically Signed   By: Charlett Nose M.D.   On: 12/31/2015 14:09    CBC  Recent Labs Lab 01/02/16 0513 01/03/16 0420 01/04/16 0224 01/05/16 0516 01/06/16 0340  WBC 6.2 7.0 5.3 6.2 6.3  HGB 12.3* 12.4* 12.0* 12.9* 12.3*  HCT 39.1 39.4 39.4 40.2 41.0  PLT 141* 128* 124* 133* 135*  MCV 92.0 92.1 90.8 88.9 90.7  MCH 28.9 29.0 27.6 28.5 27.2  MCHC 31.5 31.5 30.5 32.1 30.0  RDW 15.3 15.2 15.0 15.0 15.0    Chemistries   Recent Labs Lab 01/03/16 0420 01/04/16 0224 01/05/16 0516 01/05/16 1154 01/06/16 0340  NA 138 138 140 139 139  K 3.8 3.9 4.1 4.4 3.9  CL 102 102 102 101 103  CO2 27 28 26 28 27   GLUCOSE 100* 105* 113* 119* 119*  BUN 29* 30* 27* 27* 30*  CREATININE 1.45* 1.50* 1.37* 1.39* 1.38*  CALCIUM 9.0 8.9 9.5 9.4 9.0  MG  --   --   --  2.3  --    ------------------------------------------------------------------------------------------------------------------ estimated creatinine clearance is 58 mL/min (by C-G formula based on Cr of 1.38). ------------------------------------------------------------------------------------------------------------------ No results for input(s): HGBA1C in the last 72 hours. ------------------------------------------------------------------------------------------------------------------ No results for input(s): CHOL, HDL, LDLCALC, TRIG, CHOLHDL, LDLDIRECT in the last 72 hours. ------------------------------------------------------------------------------------------------------------------ No results for input(s): TSH, T4TOTAL, T3FREE, THYROIDAB in the last 72 hours.  Invalid input(s): FREET3 ------------------------------------------------------------------------------------------------------------------ No results for  input(s): VITAMINB12, FOLATE, FERRITIN, TIBC, IRON, RETICCTPCT in the last 72 hours.  Coagulation profile  Recent Labs Lab 01/02/16 0513 01/03/16 0420 01/04/16 0224 01/05/16 0516 01/06/16 0340  INR 1.26 1.25 1.57* 1.90* 2.07*    No results for input(s): DDIMER in the last 72 hours.  Cardiac Enzymes  Recent Labs Lab 12/30/15 1611  TROPONINI 0.31*   ------------------------------------------------------------------------------------------------------------------ Invalid input(s): POCBNP    Nikodem Leadbetter D.O. on 01/06/2016 at 1:26 PM  Between 7am to 7pm - Pager - [phone]  After 7pm go to [email or web address] - password [email or web address]  And look for the night coverage person covering for me after hours  [facility] Office  [phone]

## 2016-01-06 NOTE — Anesthesia Preprocedure Evaluation (Addendum)
Anesthesia Evaluation  Patient identified by MRN, date of birth, ID band Patient awake    Reviewed: Allergy & Precautions, H&P , NPO status , Patient's Chart, lab work & pertinent test results, reviewed documented beta blocker date and time   Airway Mallampati: II  TM Distance: >3 FB Neck ROM: Full    Dental no notable dental hx. (+) Poor Dentition, Dental Advisory Given   Pulmonary sleep apnea , former smoker,    Pulmonary exam normal breath sounds clear to auscultation       Cardiovascular hypertension, Pt. on medications and Pt. on home beta blockers +CHF  + dysrhythmias Atrial Fibrillation  Rhythm:Irregular Rate:Tachycardia     Neuro/Psych Depression negative neurological ROS  negative psych ROS   GI/Hepatic negative GI ROS, Neg liver ROS,   Endo/Other  diabetes, Type 2, Oral Hypoglycemic Agents  Renal/GU Renal disease  negative genitourinary   Musculoskeletal   Abdominal   Peds  Hematology negative hematology ROS (+)   Anesthesia Other Findings   Reproductive/Obstetrics negative OB ROS                            Anesthesia Physical Anesthesia Plan  ASA: IV  Anesthesia Plan: MAC   Post-op Pain Management:    Induction: Intravenous  Airway Management Planned: Nasal Cannula  Additional Equipment:   Intra-op Plan:   Post-operative Plan:   Informed Consent: I have reviewed the patients History and Physical, chart, labs and discussed the procedure including the risks, benefits and alternatives for the proposed anesthesia with the patient or authorized representative who has indicated his/her understanding and acceptance.   Dental advisory given  Plan Discussed with: CRNA  Anesthesia Plan Comments:         Anesthesia Quick Evaluation
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
## 2016-01-07 ENCOUNTER — Encounter (HOSPITAL_COMMUNITY): Payer: Self-pay | Admitting: Internal Medicine

## 2016-01-07 LAB — GLUCOSE, CAPILLARY
Glucose-Capillary: 109 mg/dL — ABNORMAL HIGH (ref 65–99)
Glucose-Capillary: 107 mg/dL — ABNORMAL HIGH (ref 65–99)
Glucose-Capillary: 108 mg/dL — ABNORMAL HIGH (ref 65–99)
Glucose-Capillary: 134 mg/dL — ABNORMAL HIGH (ref 65–99)

## 2016-01-07 LAB — BASIC METABOLIC PANEL
Anion gap: 10 (ref 5–15)
BUN: 33 mg/dL — AB (ref 6–20)
CHLORIDE: 103 mmol/L (ref 101–111)
CO2: 26 mmol/L (ref 22–32)
CREATININE: 1.32 mg/dL — AB (ref 0.61–1.24)
Calcium: 9.1 mg/dL (ref 8.9–10.3)
GFR calc Af Amer: >60 mL/min (ref >60)
GFR calc non Af Amer: 55 mL/min — ABNORMAL LOW (ref >60)
GLUCOSE: 113 mg/dL — AB (ref 65–99)
POTASSIUM: 4.2 mmol/L (ref 3.5–5.1)
SODIUM: 139 mmol/L (ref 135–145)

## 2016-01-07 LAB — PROTIME-INR
INR: 2.65 — ABNORMAL HIGH (ref 0.00–1.49)
PROTHROMBIN TIME: 27.9 s — AB (ref 11.6–15.2)

## 2016-01-07 LAB — CBC
HEMATOCRIT: 40.8 % (ref 39.0–52.0)
HEMOGLOBIN: 12.9 g/dL — AB (ref 13.0–17.0)
MCH: 28.5 pg (ref 26.0–34.0)
MCHC: 31.6 g/dL (ref 30.0–36.0)
MCV: 90.1 fL (ref 78.0–100.0)
Platelets: 144 10*3/uL — ABNORMAL LOW (ref 150–400)
RBC: 4.53 MIL/uL (ref 4.22–5.81)
RDW: 15.1 % (ref 11.5–15.5)
WBC: 6.3 10*3/uL (ref 4.0–10.5)

## 2016-01-07 MED ORDER — POTASSIUM CHLORIDE CRYS ER 10 MEQ PO TBCR: 10.0000 meq | EXTENDED_RELEASE_TABLET | Freq: Every day | ORAL | Status: DC

## 2016-01-07 MED ORDER — LOSARTAN POTASSIUM 25 MG PO TABS: 25.0000 mg | ORAL_TABLET | Freq: Every day | ORAL | Status: DC

## 2016-01-07 MED ORDER — METOPROLOL SUCCINATE ER 25 MG PO TB24: 75.0000 mg | ORAL_TABLET | Freq: Two times a day (BID) | ORAL | Status: DC

## 2016-01-07 MED ORDER — METOPROLOL SUCCINATE ER 50 MG PO TB24: 50.0000 mg | ORAL_TABLET | Freq: Two times a day (BID) | ORAL | Status: DC

## 2016-01-07 MED ORDER — FUROSEMIDE 80 MG PO TABS: 80.0000 mg | ORAL_TABLET | Freq: Two times a day (BID) | ORAL | Status: DC

## 2016-01-07 MED ORDER — WARFARIN SODIUM 6 MG PO TABS: 5.0000 mg | ORAL_TABLET | Freq: Once | ORAL | Status: DC

## 2016-01-07 MED ORDER — METOPROLOL SUCCINATE ER 50 MG PO TB24
75.0000 mg | ORAL_TABLET | Freq: Two times a day (BID) | ORAL | Status: DC
Start: 2016-01-07 — End: 2016-01-08
  Administered 2016-01-07 – 2016-01-08 (×2): 75 mg via ORAL
  Filled 2016-01-07 (×2): qty 1

## 2016-01-07 MED ORDER — ARIPIPRAZOLE 2 MG PO TABS: 2.0000 mg | ORAL_TABLET | Freq: Two times a day (BID) | ORAL | Status: DC

## 2016-01-07 MED ORDER — ESCITALOPRAM OXALATE 10 MG PO TABS: 10.0000 mg | ORAL_TABLET | Freq: Every day | ORAL | Status: DC

## 2016-01-07 MED ORDER — DIGOXIN 125 MCG PO TABS: 0.1250 mg | ORAL_TABLET | Freq: Every day | ORAL | Status: DC

## 2016-01-07 MED ORDER — WARFARIN SODIUM 5 MG PO TABS
5.0000 mg | ORAL_TABLET | Freq: Once | ORAL | Status: AC
  Administered 2016-01-07: 5 mg via ORAL
  Filled 2016-01-07: qty 1

## 2016-01-07 NOTE — Discharge Summary (Addendum)
Physician Discharge Summary  [name] VKF:[id number] DOB: 05-12-1951 DOA: 12/28/2015  PCP: Darren Cheadle, MD  Admit date: 12/28/2015 Discharge date: 01/08/2016  Time spent: 45 minutes  Recommendations for Outpatient Follow-up:  Patient will be discharged to home.  Patient will need to follow up with primary care provider within one week of discharge, repeat BMP and INR.  Patient should continue medications as prescribed.  Patient should follow a heart healthy/carb modified diet. Follow up with cardiology at the specified time.    Discharge Diagnoses:  Acute on chronic systolic heart failure Atrial fibrillation with RVR Elevated troponin Essential hypertension Acute on chronic kidney disease, stage III Depression/psychosocial issue/noncompliance Diabetes mellitus, type II  Discharge Condition: Stable  Diet recommendation: heart healthy/carb modified  Filed Weights   01/05/16 0520 01/06/16 0646 01/07/16 0436  Weight: 88.724 kg (195 lb 9.6 oz) 89.449 kg (197 lb 3.2 oz) 87.454 kg (192 lb 12.8 oz)    History of present illness:  on 12/28/2015 by Ms. Darren Smothers, NP [name] is a 65 y.o. male with a past medical history that includes type 2 diabetes uncontrolled, chronic kidney disease stage III, obesity, lower extremity edema, hypertension, chronic systolic heart failure, obstructive sleep apnea A. fib noncompliant with Coumadin presents to the emergency department with chief complaint of worsening lower extremity edema. Initial evaluation reveals acute on chronic systolic heart failure with an elevated troponin. Information is obtained from the patient. He reports noncompliance with medication and follow-up medical care since 2015. He states he came to the emergency department when his legs swelled to the point that it caused him pain and made it difficult to walk. He denies any chest pain palpitations headache dizziness syncope or near-syncope. He denies orthopnea cough  nausea vomiting. He denies dysuria hematuria frequency or urgency. 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, no evidence of ischemia, severe generalized hypokinesia with EF of 20% -Continue diuresis, patient was placed on 80 of Lasix BID -Continue metoprolol, losartan  Atrial fibrillation with RVR -Patient had TEE DCCV July 2015, has been on Coumadin past however was not compliant -Continue digoxin, metoprolol -Continue Coumadin and heparin -INR 2.65 today -Spoke with patient regarding taking coumadin at discharge, he explained to me that he felt he was a "65 year old stuck in a 65 year old body" and did know if he would take all of these medications -TTE showed LA thrombus, cardioversion cancelled.  Patient will have to continue coumadin for several months, before DCCV can be re-attempted.  Elevated troponin -Likely secondary to the above -Peak 0.48, consistent with demand ischemia in the setting of CHF and he flutter with RVR  Essential hypertension -Continue Lasix, metoprolol, losartan -Patient has been noncompliant, was supposed to be on Coreg, Lasix, lisinopril however has not taken these medications in over a year  Acute on chronic kidney disease, stage III -Creatinine 1.6 on admission, currently 1.32 -Continue to monitor BMP  Depression/psychosocial issues/noncompliance -Psychiatry consulted and appreciated-did not feel patient was candidate for inpatient psych admission, recommended Lexapro and Abilify -Counseled patient on the importance of taking his medications  Diabetes mellitus, type II -Hemoglobin A1c 6 -Continue home  regimen at discharge -Follow up with PCP  Procedures  Echocardiogram Stress test Transesophageal echocardiogram  Consults  Cardiology Psychiatry  Discharge Exam: Filed Vitals:   01/06/16 2023 01/07/16 0436  BP: 111/62 135/99  Pulse: 79 118  Temp: 97.5 F (36.4 C) 97.7 F (36.5 C)  Resp: 16 18    Exam  General: Well developed, well nourished, NAD  HEENT: NCAT, mucous membranes moist.   Cardiovascular: S1 S2 auscultated, tachycardic, irregular  Respiratory: Clear to auscultation bilaterally  Abdomen: Soft, nontender, nondistended, + bowel sounds  Extremities: warm dry without cyanosis clubbing, edema  Neuro: AAOx3, nonfocal  Psych: Flat affect  Discharge Instructions      Discharge Instructions    Discharge instructions    Complete by:  As directed   Patient will be discharged to home.  Patient will need to follow up with primary care provider within one week of discharge.  Patient should continue medications as prescribed.  Patient should follow a heart healthy/carb modified diet. Follow up with cardiology at the specified time.            Medication List    STOP taking these medications        carvedilol 12.5 MG tablet  Commonly known as:  COREG     lisinopril 2.5 MG tablet  Commonly known as:  PRINIVIL,ZESTRIL     spironolactone 25 MG tablet  Commonly known as:  ALDACTONE      TAKE these medications        ARIPiprazole 2 MG tablet  Commonly known as:  ABILIFY  Take 1 tablet (2 mg total) by mouth 2 (two) times daily.     digoxin 0.125 MG tablet  Commonly known as:  LANOXIN  Take 1 tablet (0.125 mg total) by mouth daily.     escitalopram 10 MG tablet  Commonly known as:  LEXAPRO  Take 1 tablet (10 mg total) by mouth daily.     furosemide 80 MG tablet  Commonly known as:  LASIX  Take 1 tablet (80 mg total) by mouth 2 (two) times daily.     hydrOXYzine 25 MG tablet  Commonly known as:  ATARAX/VISTARIL  Take 1 tablet (25 mg  total) by mouth every 8 (eight) hours as needed for itching.     losartan 25 MG tablet  Commonly known as:  COZAAR  Take 1 tablet (25 mg total) by mouth daily.     metFORMIN 500 MG tablet  Commonly known as:  GLUCOPHAGE  Take 1 tablet (500 mg total) by mouth daily with breakfast.     metoprolol succinate 50 MG 24 hr tablet  Commonly known as:  TOPROL-XL  Take 1 tablet (50 mg total) by mouth 2 (two) times daily. Take with or immediately following a meal.     permethrin 5 % cream  Commonly known as:  ELIMITE  Apply from chin to toes, leave 8 hours, then shower. Repeat x 1 in one week.     potassium chloride 10 MEQ tablet  Commonly known as:  K-DUR,KLOR-CON  Take 1 tablet (10 mEq total) by mouth daily.     warfarin 6 MG tablet  Commonly known as:  COUMADIN  Take 1 tablet (6 mg total) by mouth one time only at 6 PM.       No Known Allergies Follow-up Information    Follow up with [facility].   Contact information:   [address] [place] [address] [phone]      Follow up with [facility] On 01/21/2016.   Specialty:  Cardiology   Why:  9:20AM. Cardiology followup with Dr. Gala Romney. Garage Code: Glass blower/designer information:   [address] [id number] mc [place] [address] [phone]       The results of significant diagnostics from this hospitalization (including imaging, microbiology, ancillary and laboratory) are listed below for reference.    Significant Diagnostic Studies: Dg Chest 2 View  12/28/2015  CLINICAL DATA:  Acute chest pain shortness of breath, diabetes, hypertension EXAM: CHEST  2 VIEW COMPARISON:  05/08/2014 FINDINGS: Stable cardiomegaly with slight vascular congestion. Minor streaky bibasilar densities and trace pleural effusions. No definite focal pneumonia. No current CHF or pneumothorax. Trachea midline. Degenerative changes of  the spine. IMPRESSION: cardiomegaly with trace pleural effusions and bibasilar atelectasis. Electronically Signed   By: Judie Petit.  Shick M.D.   On: 12/28/2015 12:45   Nm Myocar Multi W/spect W/wall Motion / Ef  12/31/2015  CLINICAL DATA:  Atrial fibrillation, CHF.  Diabetes, hypertension. EXAM: MYOCARDIAL IMAGING WITH SPECT (REST AND PHARMACOLOGIC-STRESS) GATED LEFT VENTRICULAR WALL MOTION STUDY LEFT VENTRICULAR EJECTION FRACTION TECHNIQUE: Standard myocardial SPECT imaging was performed after resting intravenous injection of 10 mCi Tc-42m sestamibi. Subsequently, intravenous infusion of Lexiscan was performed under the supervision of the Cardiology staff. At peak effect of the drug, 30 mCi Tc-85m sestamibi was injected intravenously and standard myocardial SPECT imaging was performed. Quantitative gated imaging was also performed to evaluate left ventricular wall motion, and estimate left ventricular ejection fraction. COMPARISON:  None. FINDINGS: Perfusion: Next defects noted on rest and stress images within the inferior wall and to lesser extent the septum. No reversible defects. Wall Motion: Severe generalized hypokinesia. Left ventricle is dilute Left Ventricular Ejection Fraction: 20 % End diastolic volume 218 ml End systolic volume 173 ml IMPRESSION: 1. 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 No evidence of ischemia. 2. Severe generalized hypokinesia with dilated left ventricle. 3. Left ventricular ejection fraction 20% 4. High-risk stress test findings*. *2012 Appropriate Use Criteria for Coronary Revascularization Focused Update: J Am Coll Cardiol. 2012;59(9):857-881. http://content.dementiazones.com.aspx?articleid=1201161 Electronically Signed   By: Charlett Nose M.D.   On: 12/31/2015 14:09    Microbiology: No results found for this or any previous visit (from the past 240 hour(s)).   Labs: Basic Metabolic Panel:  Recent Labs Lab 01/04/16 0224 01/05/16 0516  01/05/16 1154 01/06/16 0340 01/07/16 0617  NA 138 140 139 139 139  K 3.9 4.1 4.4 3.9 4.2  CL 102 102 101 103 103  CO2 28 26 28 27 26   GLUCOSE 105* 113* 119* 119* 113*  BUN 30* 27* 27* 30* 33*  CREATININE 1.50* 1.37* 1.39* 1.38* 1.32*  CALCIUM 8.9 9.5 9.4 9.0 9.1  MG  --   --  2.3  --   --    Liver Function Tests: No results for input(s): AST, ALT, ALKPHOS, BILITOT, PROT, ALBUMIN in the last 168 hours. No results for input(s): LIPASE, AMYLASE in the last 168 hours. No results for input(s): AMMONIA in the last 168 hours. CBC:  Recent Labs Lab 01/03/16 0420 01/04/16 0224 01/05/16 0516 01/06/16 0340 01/07/16 0617  WBC 7.0 5.3 6.2 6.3 6.3  HGB 12.4* 12.0* 12.9* 12.3* 12.9*  HCT 39.4 39.4 40.2 41.0 40.8  MCV 92.1 90.8 88.9 90.7 90.1  PLT 128* 124* 133* 135* 144*   Cardiac Enzymes: No results for input(s): CKTOTAL, CKMB, CKMBINDEX, TROPONINI in the last 168 hours. BNP: BNP (last 3  results)  Recent Labs  12/28/15 1449  BNP 886.8*    ProBNP (last 3 results) No results for input(s): PROBNP in the last 8760 hours.  CBG:  Recent Labs Lab 01/06/16 1123 01/06/16 1635 01/06/16 2021 01/07/16 0636 01/07/16 1230  GLUCAP 142* 118* 142* 109* 107*       Signed:  Oluwatamilore Starnes  [facility] 01/07/2016, 2:29 PM

## 2016-01-07 NOTE — Progress Notes (Signed)
ANTICOAGULATION CONSULT NOTE - Follow Up Consult  Pharmacy Consult for Heparin Indication: Afib/flutter  No Known Allergies  Patient Measurements: Height: 5\' 8"  (172.7 cm) Weight: 192 lb 12.8 oz (87.454 kg) (scale b) IBW/kg (Calculated) : 68.4  Vital Signs: Temp: 97.7 F (36.5 C) (02/28 0436) Temp Source: Oral (02/28 0436) BP: 135/99 mmHg (02/28 0436) Pulse Rate: 118 (02/28 0436)  Labs:  Recent Labs  01/05/16 0516 01/05/16 1154 01/06/16 0340 01/07/16 0617  HGB 12.9*  --  12.3* 12.9*  HCT 40.2  --  41.0 40.8  PLT 133*  --  135* 144*  LABPROT 21.7*  --  23.1* 27.9*  INR 1.90*  --  2.07* 2.65*  HEPARINUNFRC 0.54  --  0.62  --   CREATININE 1.37* 1.39* 1.38* 1.32*    Estimated Creatinine Clearance: 60 mL/min (by C-G formula based on Cr of 1.32).   Assessment: 65 yo male with history of Afib, not taking his warfarin PTA, presents with edema. Pharmacy is consulted to dose heparin for atrial flutter. Pt reports that he doesn't take any of his home meds.  PMH: DM2, CKD3, Obestity, edema, HTN, CHF, OSA, Afib (noncompliant w/ warfarin)  AC: Heparin for aflutter. PTA warfarin but pt not taking? Discussed with MD over weekend about noncompliance with all meds and plans to continue [facility]? CHADSVASC 4. INR 2.07>2.65 large jump. CBC stable. - 2/27 TEE: EF down to 10-15%. LA thrombus  Goal of Therapy:  INR 2-3 Monitor platelets by anticoagulation protocol: Yes   Plan:  Coumadin 5mg  po x 1 tonight. Daily INR   [name] S. Merilynn Finland, PharmD, BCPS Clinical Staff Pharmacist Pager [phone]  Misty Stanley Stillinger 01/07/2016,10:58 AM

## 2016-01-07 NOTE — Progress Notes (Signed)
Patient Name: [name] Date of Encounter: 01/07/2016  Primary Cardiologist Dr. Gala Romney   Principal Problem:   Acute on chronic systolic heart failure [facility]) Active Problems:   Swelling of lower extremity   Depression   Type 2 diabetes, uncontrolled, with renal manifestation (HCC)   CKD (chronic kidney disease), stage II   Obesity (BMI 30-39.9)   HTN (hypertension)   Atrial flutter (HCC)   Elevated troponin   Psychiatric diagnosis deferred   Hypertensive heart disease with heart failure (HCC)   Acute on chronic systolic and diastolic heart failure, NYHA class 3 (HCC)   Anticoagulation adequate    SUBJECTIVE  Denies any CP or SOB.   CURRENT MEDS . ARIPiprazole  2 mg Oral BID  . digoxin  0.125 mg Oral Daily  . escitalopram  10 mg Oral Daily  . furosemide  80 mg Oral BID  . insulin aspart  0-15 Units Subcutaneous TID WC  . insulin aspart  0-5 Units Subcutaneous QHS  . losartan  25 mg Oral Daily  . metoprolol succinate  50 mg Oral BID  . potassium chloride  40 mEq Oral Daily  . sodium chloride flush  3 mL Intravenous Q12H  . sodium chloride flush  3 mL Intravenous Q12H  . warfarin  5 mg Oral ONCE-1800  . Warfarin - Pharmacist Dosing Inpatient   Does not apply q1800    OBJECTIVE  Filed Vitals:   01/06/16 1445 01/06/16 1513 01/06/16 2023 01/07/16 0436  BP: 128/84 119/82 111/62 135/99  Pulse: 118 116 79 118  Temp:   97.5 F (36.4 C) 97.7 F (36.5 C)  TempSrc:   Oral Oral  Resp: 14 16 16 18   Height:      Weight:    192 lb 12.8 oz (87.454 kg)  SpO2: 98% 99% 99% 97%    Intake/Output Summary (Last 24 hours) at 01/07/16 1405 Last data filed at 01/07/16 0436  Gross per 24 hour  Intake    610 ml  Output   1325 ml  Net   -715 ml   Filed Weights   01/05/16 0520 01/06/16 0646 01/07/16 0436  Weight: 195 lb 9.6 oz (88.724 kg) 197 lb 3.2 oz (89.449 kg) 192 lb 12.8 oz (87.454 kg)    PHYSICAL EXAM  General: Pleasant, NAD. Neuro: Alert and oriented X 3.  Moves all extremities spontaneously. Psych: Normal affect. HEENT:  Normal  Neck: Supple without bruits or JVD. Lungs:  Resp regular and unlabored, CTA. Heart: irregular. no s3, s4, or murmurs. Abdomen: Soft, non-tender, non-distended, BS + x 4.  Extremities: No clubbing, cyanosis or edema. DP/PT/Radials 2+ and equal bilaterally.  Accessory Clinical Findings  CBC  Recent Labs  01/06/16 0340 01/07/16 0617  WBC 6.3 6.3  HGB 12.3* 12.9*  HCT 41.0 40.8  MCV 90.7 90.1  PLT 135* 144*   Basic Metabolic Panel  Recent Labs  01/05/16 1154 01/06/16 0340 01/07/16 0617  NA 139 139 139  K 4.4 3.9 4.2  CL 101 103 103  CO2 28 27 26   GLUCOSE 119* 119* 113*  BUN 27* 30* 33*  CREATININE 1.39* 1.38* 1.32*  CALCIUM 9.4 9.0 9.1  MG 2.3  --   --     TELE afib with HR 110s    ECG  No new EKG  Echocardiogram 12/30/2015  LV EF: 20% -  25%  ------------------------------------------------------------------- Indications:   CHF - 428.0.  ------------------------------------------------------------------- Study Conclusions  - Left ventricle: The cavity size was severely dilated.  Systolic function was severely reduced. The estimated ejection fraction was in the range of 20% to 25%. Severe diffuse hypokinesis. There is akinesis of the basal-mid anteroseptal and apical anterior myocardium. There is akinesis of the basalinferior myocardium. There is akinesis of the entireanterior myocardium. The tissue Doppler parameters were abnormal. The study is not technically sufficient to allow evaluation of LV diastolic function. - Aortic valve: Moderate thickening and calcification, consistent with sclerosis. - Mitral valve: There was mild regurgitation. - Left atrium: The atrium was severely dilated. - Right ventricle: Systolic function was moderately to severely reduced. - Tricuspid valve: There was mild regurgitation. - Pulmonary arteries: PA peak pressure: 51 mm  Hg (S).  Impressions:  - The right ventricular systolic pressure was increased consistent with moderate pulmonary hypertension.    Radiology/Studies  Dg Chest 2 View  12/28/2015  CLINICAL DATA:  Acute chest pain shortness of breath, diabetes, hypertension EXAM: CHEST  2 VIEW COMPARISON:  05/08/2014 FINDINGS: Stable cardiomegaly with slight vascular congestion. Minor streaky bibasilar densities and trace pleural effusions. No definite focal pneumonia. No current CHF or pneumothorax. Trachea midline. Degenerative changes of the spine. IMPRESSION: cardiomegaly with trace pleural effusions and bibasilar atelectasis. Electronically Signed   By: Judie Petit.  Shick M.D.   On: 12/28/2015 12:45   Nm Myocar Multi W/spect W/wall Motion / Ef  12/31/2015  CLINICAL DATA:  Atrial fibrillation, CHF.  Diabetes, hypertension. EXAM: MYOCARDIAL IMAGING WITH SPECT (REST AND PHARMACOLOGIC-STRESS) GATED LEFT VENTRICULAR WALL MOTION STUDY LEFT VENTRICULAR EJECTION FRACTION TECHNIQUE: Standard myocardial SPECT imaging was performed after resting intravenous injection of 10 mCi Tc-65m sestamibi. Subsequently, intravenous infusion of Lexiscan was performed under the supervision of the Cardiology staff. At peak effect of the drug, 30 mCi Tc-59m sestamibi was injected intravenously and standard myocardial SPECT imaging was performed. Quantitative gated imaging was also performed to evaluate left ventricular wall motion, and estimate left ventricular ejection fraction. COMPARISON:  None. FINDINGS: Perfusion: Next defects noted on rest and stress images within the inferior wall and to lesser extent the septum. No reversible defects. Wall Motion: Severe generalized hypokinesia. Left ventricle is dilute Left Ventricular Ejection Fraction: 20 % End diastolic volume 218 ml End systolic volume 173 ml IMPRESSION: 1. 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 No evidence of ischemia. 2. Severe generalized hypokinesia  with dilated left ventricle. 3. Left ventricular ejection fraction 20% 4. High-risk stress test findings*. *2012 Appropriate Use Criteria for Coronary Revascularization Focused Update: J Am Coll Cardiol. 2012;59(9):857-881. http://content.[email or web address]?articleid=[email or web address] Electronically Signed   By: Charlett Nose M.D.   On: 12/31/2015 14:09    ASSESSMENT AND PLAN  1. Acute on chronic systolic HF - Echo 05/09/2014 EF 25-30%, moderate LVH, peak PA pressure  - New echo: EF 20-25%, diffuse hypokinesis. There is akinesis of the basal-mid anteroseptal and apical anteriormyocardium. There is akinesis of the basalinferior myocardium. LA severely dilated. PA pressure .  - transitioned to PO lasix on 2/21, currently on 80mg  BID lasix, Cr stable. Euvolemic on exam today.   2. Atrial flutter with RVR  - CHA2DS2-Vasc score 4 (HTN, DM, age, HF), coumadin restarted, INR therapeutic  - s/p TEE on 2/27, positive for LAA thrombus, DDCV cancelled  - toprol XL increased to 50mg  BID, HR improved to 70-80s with occasional 110s. No further workup expected, he is stable for discharge.  3. Elevated trop with new LV dysfunction on echo - likely due to volume overload and renal issue  - Echo 12/30/2015 EF 20-25%, severe  diffuse hypokinesis, akinesis of basal-mid anteroseptal and apical anterior myocardium, mild MR, severely dilated LA, mild TR, PA peak pressure  - myoview 12/31/2015 old infarct/scar in the inferior wall, no evidence of ischemia, EF 20%, high risk stress test finding    4. Acute on chronic renal insufficiency - baseline Cr 1.1 in 05/2014, now Cr 1.6-1.7, unclear how much of this is acute on chronic. Likely related to noncompliance with medication  5. OSA 6. CKD 7. DM 8. H/o afib 9. Psychosocial issue: Per hospital team. His chronic management will not be successful without these issues being addressed. He needs a psychiatry  consult.IM called psych consult    Signed, Amedeo Plenty Pager: [phone]     Patient seen and examined. Agree with assessment and plan. Currently HR in the 120's now on Toprol 50 mg bid; will further titrate today to 75 mg bid for improved rate control and not dc today. Check ECG in am; and f/u labs. Ultimately will need to titrate ARB.   Lennette Bihari, MD, [facility] 01/07/2016 2:18 PM

## 2016-01-08 DIAGNOSIS — I481 Persistent atrial fibrillation: Secondary | ICD-10-CM

## 2016-01-08 DIAGNOSIS — I509 Heart failure, unspecified: Secondary | ICD-10-CM

## 2016-01-08 LAB — GLUCOSE, CAPILLARY
GLUCOSE-CAPILLARY: 115 mg/dL — AB (ref 65–99)
GLUCOSE-CAPILLARY: 146 mg/dL — AB (ref 65–99)
Glucose-Capillary: 106 mg/dL — ABNORMAL HIGH (ref 65–99)
Glucose-Capillary: 93 mg/dL (ref 65–99)

## 2016-01-08 LAB — PROTIME-INR
INR: 2.61 — AB (ref 0.00–1.49)
PROTHROMBIN TIME: 27.6 s — AB (ref 11.6–15.2)

## 2016-01-08 MED ORDER — METOPROLOL SUCCINATE ER 25 MG PO TB24
25.0000 mg | ORAL_TABLET | Freq: Once | ORAL | Status: AC
Start: 2016-01-08 — End: 2016-01-08
  Administered 2016-01-08: 25 mg via ORAL
  Filled 2016-01-08: qty 1

## 2016-01-08 MED ORDER — WARFARIN SODIUM 3 MG PO TABS
6.0000 mg | ORAL_TABLET | Freq: Once | ORAL | Status: AC
Start: 2016-01-08 — End: 2016-01-08
  Administered 2016-01-08: 6 mg via ORAL
  Filled 2016-01-08: qty 2

## 2016-01-08 MED ORDER — METOPROLOL SUCCINATE ER 100 MG PO TB24
100.0000 mg | ORAL_TABLET | Freq: Two times a day (BID) | ORAL | Status: DC
  Administered 2016-01-08 – 2016-01-10 (×4): 100 mg via ORAL
  Filled 2016-01-08 (×4): qty 1

## 2016-01-08 NOTE — Progress Notes (Signed)
Patient Name: [name] Date of Encounter: 01/08/2016  Primary Cardiologist Dr. Gala Romney   Principal Problem:   Acute on chronic systolic heart failure (HCC) Active Problems:   Swelling of lower extremity   Depression   Type 2 diabetes, uncontrolled, with renal manifestation (HCC)   CKD (chronic kidney disease), stage II   Obesity (BMI 30-39.9)   HTN (hypertension)   Atrial flutter (HCC)   Elevated troponin   Psychiatric diagnosis deferred   Hypertensive heart disease with heart failure (HCC)   Acute on chronic systolic and diastolic heart failure, NYHA class 3 (HCC)   Anticoagulation adequate    SUBJECTIVE  Denies any CP or SOB.   CURRENT MEDS . ARIPiprazole  2 mg Oral BID  . digoxin  0.125 mg Oral Daily  . escitalopram  10 mg Oral Daily  . furosemide  80 mg Oral BID  . insulin aspart  0-15 Units Subcutaneous TID WC  . insulin aspart  0-5 Units Subcutaneous QHS  . losartan  25 mg Oral Daily  . metoprolol succinate  100 mg Oral BID  . metoprolol succinate  25 mg Oral Once  . potassium chloride  40 mEq Oral Daily  . sodium chloride flush  3 mL Intravenous Q12H  . sodium chloride flush  3 mL Intravenous Q12H  . Warfarin - Pharmacist Dosing Inpatient   Does not apply q1800    OBJECTIVE  Filed Vitals:   01/07/16 2052 01/08/16 0517 01/08/16 1126 01/08/16 1249  BP: 105/77 127/90 132/60 136/99  Pulse: 78 118 81 121  Temp: 98.1 F (36.7 C) 97.5 F (36.4 C) 98.2 F (36.8 C) 98.1 F (36.7 C)  TempSrc: Oral Oral Oral Oral  Resp: Height:      Weight:  191 lb 3.2 oz (86.728 kg)    SpO2: 97% 95% 95% 97%    Intake/Output Summary (Last 24 hours) at 01/08/16 1329 Last data filed at 01/08/16 1250  Gross per 24 hour  Intake    822 ml  Output   2150 ml  Net  -1328 ml   Filed Weights   01/06/16 0646 01/07/16 0436 01/08/16 0517  Weight: 197 lb 3.2 oz (89.449 kg) 192 lb 12.8 oz (87.454 kg) 191 lb 3.2 oz (86.728 kg)    PHYSICAL  EXAM  General: Pleasant, NAD. Neuro: Alert and oriented X 3. Moves all extremities spontaneously. Psych: Normal affect. HEENT:  Normal  Neck: Supple without bruits or JVD. Lungs:  Resp regular and unlabored, CTA. Heart: irregular, tachycardic. no s3, s4, or murmurs. Abdomen: Soft, non-tender, non-distended, BS + x 4.  Extremities: No clubbing, cyanosis or edema. DP/PT/Radials 2+ and equal bilaterally.  Accessory Clinical Findings  CBC  Recent Labs  01/06/16 0340 01/07/16 0617  WBC 6.3 6.3  HGB 12.3* 12.9*  HCT 41.0 40.8  MCV 90.7 90.1  PLT 135* 144*   Basic Metabolic Panel  Recent Labs  01/06/16 0340 01/07/16 0617  NA 139 139  K 3.9 4.2  CL 103 103  CO2 27 26  GLUCOSE 119* 113*  BUN 30* 33*  CREATININE 1.38* 1.32*  CALCIUM 9.0 9.1    TELE afib with HR 110s    ECG  No new EKG  Echocardiogram 12/30/2015  LV EF: 20% -  25%  ------------------------------------------------------------------- Indications:   CHF - 428.0.  ------------------------------------------------------------------- Study Conclusions  - Left ventricle: The cavity size was severely dilated. Systolic function was severely reduced. The estimated ejection fraction was in the  range of 20% to 25%. Severe diffuse hypokinesis. There is akinesis of the basal-mid anteroseptal and apical anterior myocardium. There is akinesis of the basalinferior myocardium. There is akinesis of the entireanterior myocardium. The tissue Doppler parameters were abnormal. The study is not technically sufficient to allow evaluation of LV diastolic function. - Aortic valve: Moderate thickening and calcification, consistent with sclerosis. - Mitral valve: There was mild regurgitation. - Left atrium: The atrium was severely dilated. - Right ventricle: Systolic function was moderately to severely reduced. - Tricuspid valve: There was mild regurgitation. - Pulmonary arteries: PA peak  pressure: 51 mm Hg (S).  Impressions:  - The right ventricular systolic pressure was increased consistent with moderate pulmonary hypertension.    Radiology/Studies  Dg Chest 2 View  12/28/2015  CLINICAL DATA:  Acute chest pain shortness of breath, diabetes, hypertension EXAM: CHEST  2 VIEW COMPARISON:  05/08/2014 FINDINGS: Stable cardiomegaly with slight vascular congestion. Minor streaky bibasilar densities and trace pleural effusions. No definite focal pneumonia. No current CHF or pneumothorax. Trachea midline. Degenerative changes of the spine. IMPRESSION: cardiomegaly with trace pleural effusions and bibasilar atelectasis. Electronically Signed   By: Judie Petit.  Shick M.D.   On: 12/28/2015 12:45   Nm Myocar Multi W/spect W/wall Motion / Ef  12/31/2015  CLINICAL DATA:  Atrial fibrillation, CHF.  Diabetes, hypertension. EXAM: MYOCARDIAL IMAGING WITH SPECT (REST AND PHARMACOLOGIC-STRESS) GATED LEFT VENTRICULAR WALL MOTION STUDY LEFT VENTRICULAR EJECTION FRACTION TECHNIQUE: Standard myocardial SPECT imaging was performed after resting intravenous injection of 10 mCi Tc-74m sestamibi. Subsequently, intravenous infusion of Lexiscan was performed under the supervision of the Cardiology staff. At peak effect of the drug, 30 mCi Tc-87m sestamibi was injected intravenously and standard myocardial SPECT imaging was performed. Quantitative gated imaging was also performed to evaluate left ventricular wall motion, and estimate left ventricular ejection fraction. COMPARISON:  None. FINDINGS: Perfusion: Next defects noted on rest and stress images within the inferior wall and to lesser extent the septum. No reversible defects. Wall Motion: Severe generalized hypokinesia. Left ventricle is dilute Left Ventricular Ejection Fraction: 20 % End diastolic volume 218 ml End systolic volume 173 ml IMPRESSION: 1. 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 No evidence of ischemia. 2. Severe generalized  hypokinesia with dilated left ventricle. 3. Left ventricular ejection fraction 20% 4. High-risk stress test findings*. *2012 Appropriate Use Criteria for Coronary Revascularization Focused Update: J Am Coll Cardiol. 2012;59(9):857-881. http://content.[email or web address]?[email or web address] Electronically Signed   By: Charlett Nose M.D.   On: 12/31/2015 14:09    ASSESSMENT AND PLAN  1. Acute on chronic systolic HF - Echo 05/09/2014 EF 25-30%, moderate LVH, peak PA pressure  - New echo: EF 20-25%, diffuse hypokinesis. There is akinesis of the basal-mid anteroseptal and apical anteriormyocardium. There is akinesis of the basalinferior myocardium. LA severely dilated. PA pressure .  - transitioned to PO lasix on 2/21, currently on 80mg  BID lasix, Cr stable. Euvolemic on exam today.   2. Atrial flutter with RVR  - CHA2DS2-Vasc score 4 (HTN, DM, age, HF), coumadin restarted, INR therapeutic  - s/p TEE on 2/27, positive for LAA thrombus, DDCV cancelled  - Toprol XL increased to 100mg  BID to help control HR which is still 110s most of the time  3. Elevated trop with new LV dysfunction on echo - likely due to volume overload and renal issue  - Echo 12/30/2015 EF 20-25%, severe diffuse hypokinesis, akinesis of basal-mid anteroseptal and apical anterior myocardium, mild MR, severely dilated LA, mild  TR, PA peak pressure  - myoview 12/31/2015 old infarct/scar in the inferior wall, no evidence of ischemia, EF 20%, high risk stress test finding    4. Acute on chronic renal insufficiency - baseline Cr 1.1 in 05/2014, now Cr 1.6-1.7, unclear how much of this is acute on chronic. Likely related to noncompliance with medication  5. OSA 6. CKD 7. DM 8. H/o afib 9. Psychosocial issue: Per hospital team. His chronic management will not be successful without these issues being addressed. He needs a psychiatry consult.IM called psych consult     Signed, Amedeo Plenty Pager: [phone]  Patient seen and examined. Agree with assessment and plan. On warfarin with smoke in LA. Earlier AF rate was at in 110-119;  Toprol increased to 100 bid and on dig .125. Not on antiarrythmic for additional control since LA thrombus.  EF 20 - 25%. Rate now 69-80. No orthostatic drop in BP. Consider titration of losartan to 25 mg bid, and ultimately 50 mg bid as BP allows. Keep today/ ? DC tomorrow if stable.    Lennette Bihari, MD, [facility] 01/08/2016 2:06 PM

## 2016-01-08 NOTE — Care Management Note (Cosign Needed)
Case Management Note  Patient Details  Name: ZAYSHAWN RANKIN MRN: [id number] Date of Birth: 09-16-1951  Subjective/Objective:    65 y.o. M hx DM, HTN, CKD, hx noncompliance with Coumadin, Acute on Chronic Systolic Ht Failure with Elevated Troponins.   2/27 TEE shows Moderate Pulmonary HTN. Psych Consulted 2/27 because pt "felt like a 65 yo and didn't want to take all these meds".     CM offered HHRN to  assist with education and preparation of anticoagulant and other meds. Patient declined pleasantly. Reported he is aware of the risks of stroke etc if he fails to take his anticoagulant.          Action/Plan: Will sign off for now but will be available should this patient change his mind.   Expected Discharge Date:  01/01/16               Expected Discharge Plan:  Home/Self Care  In-House Referral:     Discharge planning Services  CM Consult  Post Acute Care Choice:    Choice offered to:     DME Arranged:    DME Agency:     HH Arranged:    HH Agency:     Status of Service:  Completed, signed off  Medicare Important Message Given:  Yes Date Medicare IM Given:    Medicare IM give by:    Date Additional Medicare IM Given:    Additional Medicare Important Message give by:     If discussed at Long Length of Stay Meetings, dates discussed:    Additional Comments:  Yvone Neu, RN 01/08/2016, 11:49 AM

## 2016-01-08 NOTE — Progress Notes (Signed)
[facility] PROGRESS NOTE  MILLION GIANNAKOPOULOS AVW:[id number] DOB: 1951-03-06 DOA: 12/28/2015 PCP: [name], MD  Assessment/Plan: Acute on chronic systolic heart failure -BNP 886 upon admission -Chest x-ray showed vascular congestion -Echocardiogram showed EF 20-25% with severe diffuse hypokinesis -Stress test 12/31/2015 showed old infarct/scar in the inferior wall and septum, no evidence of ischemia, severe generalized hypokinesia with EF of 20% -Continue diuresis, patient was placed on 80 of Lasix BID -Continue metoprolol, losartan Stable.   Atrial fibrillation with RVR -Patient had TEE DCCV July 2015, has been on Coumadin past however was not compliant -Continue digoxin, metoprolol -Continue Coumadin -INR 2.65 today -Spoke with patient regarding taking coumadin at discharge, he explained to me that he felt he was a "65 year old stuck in a 65 year old body" and did know if he would take all of these medications -TTE showed LA thrombus, cardioversion cancelled. Patient will have to continue coumadin for several months, before DCCV can be re-attempted. 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  Family Communication: care discussed with patient.  Disposition Plan: home probably tomorrow.    Consultants:  Cardiology   Procedures: Echocardiogram Stress test Transesophageal echocardiogram  Antibiotics:  none  HPI/Subjective: Complaining of dizziness on standing   Objective: Filed Vitals:   01/08/16 1346 01/08/16 1351  BP:    Pulse: 40 40  Temp:    Resp:      Intake/Output Summary (Last 24 hours) at 01/08/16 1544 Last data filed at 01/08/16 1408  Gross per 24 hour  Intake   1062 ml  Output   2150 ml  Net  -1088 ml   Filed Weights   01/06/16 0646 01/07/16 0436 01/08/16 0517  Weight: 89.449 kg (197 lb 3.2 oz) 87.454 kg (192 lb 12.8 oz) 86.728 kg (191 lb 3.2 oz)    Exam:   General:  NAD  Cardiovascular: S 1, S 2 RRR  Respiratory: CTA  Abdomen: BS present, soft, nt  Musculoskeletal: no edema  Data Reviewed: Basic Metabolic Panel:  Recent Labs Lab 01/04/16 0224 01/05/16 0516 01/05/16 1154 01/06/16 0340 01/07/16 0617  NA 138 140 139 139 139  K 3.9 4.1 4.4 3.9 4.2  CL 102 102 101 103 103  CO2 28 26 28 27 26   GLUCOSE 105* 113* 119* 119* 113*  BUN 30* 27* 27* 30* 33*  CREATININE 1.50* 1.37* 1.39* 1.38* 1.32*  CALCIUM 8.9 9.5 9.4 9.0 9.1  MG  --   --  2.3  --   --    Liver Function Tests: No results for input(s): AST, ALT, ALKPHOS, BILITOT, PROT, ALBUMIN in the last 168 hours. No results for input(s): LIPASE, AMYLASE in the last 168 hours. No results for input(s): AMMONIA in  the last 168 hours. CBC:  Recent Labs Lab 01/03/16 0420 01/04/16 0224 01/05/16 0516 01/06/16 0340 01/07/16 0617  WBC 7.0 5.3 6.2 6.3 6.3  HGB 12.4* 12.0* 12.9* 12.3* 12.9*  HCT 39.4 39.4 40.2 41.0 40.8  MCV 92.1 90.8 88.9 90.7 90.1  PLT 128* 124* 133* 135* 144*   Cardiac Enzymes: No results for input(s): CKTOTAL, CKMB, CKMBINDEX, TROPONINI in the last  168 hours. BNP (last 3 results)  Recent Labs  12/28/15 1449  BNP 886.8*    ProBNP (last 3 results) No results for input(s): PROBNP in the last 8760 hours.  CBG:  Recent Labs Lab 01/07/16 1230 01/07/16 1613 01/07/16 2048 01/08/16 0613 01/08/16 1124  GLUCAP 107* 108* 134* 115* 106*    No results found for this or any previous visit (from the past 240 hour(s)).   Studies: No results found.  Scheduled Meds: . ARIPiprazole  2 mg Oral BID  . digoxin  0.125 mg Oral Daily  . escitalopram  10 mg Oral Daily  . furosemide  80 mg Oral BID  . insulin aspart  0-15 Units Subcutaneous TID WC  . insulin aspart  0-5 Units Subcutaneous QHS  . losartan  25 mg Oral Daily  . metoprolol succinate  100 mg Oral BID  . potassium chloride  40 mEq Oral Daily  . sodium chloride flush  3 mL Intravenous Q12H  . sodium chloride flush  3 mL Intravenous Q12H  . warfarin  6 mg Oral ONCE-1800  . Warfarin - Pharmacist Dosing Inpatient   Does not apply q1800   Continuous Infusions: . sodium chloride 250 mL (01/05/16 1100)    Principal Problem:   Acute on chronic systolic heart failure (HCC) Active Problems:   Swelling of lower extremity   Depression   Type 2 diabetes, uncontrolled, with renal manifestation (HCC)   CKD (chronic kidney disease), stage II   Obesity (BMI 30-39.9)   HTN (hypertension)   Atrial flutter (HCC)   Elevated troponin   Psychiatric diagnosis deferred   Hypertensive heart disease with heart failure (HCC)   Acute on chronic systolic and diastolic heart failure, NYHA class 3 (HCC)   Anticoagulation adequate    Time spent: 25 minutes.     Hartley Barefoot A  [facility] Pager [phone]. If 7PM-7AM, please contact night-coverage at [email or web address], password [facility] 01/08/2016, 3:44 PM  LOS: 11 days

## 2016-01-08 NOTE — Progress Notes (Signed)
ANTICOAGULATION CONSULT NOTE - Follow Up Consult  Pharmacy Consult for Coumadin Indication: atrial flutter  No Known Allergies  Patient Measurements: Height: 5\' 8"  (172.7 cm) Weight: 191 lb 3.2 oz (86.728 kg) (scale b) IBW/kg (Calculated) : 68.4   Vital Signs: Temp: 98.1 F (36.7 C) (03/01 1249) Temp Source: Oral (03/01 1249) BP: 136/99 mmHg (03/01 1249) Pulse Rate: 40 (03/01 1351)  Labs:  Recent Labs  01/06/16 0340 01/07/16 0617 01/08/16 0508  HGB 12.3* 12.9*  --   HCT 41.0 40.8  --   PLT 135* 144*  --   LABPROT 23.1* 27.9* 27.6*  INR 2.07* 2.65* 2.61*  HEPARINUNFRC 0.62  --   --   CREATININE 1.38* 1.32*  --     Estimated Creatinine Clearance: 59.7 mL/min (by C-G formula based on Cr of 1.32).  Assessment:   INR remains therapeutic (2.61). Heparin off since 2/27 when INR >2.   Previously on Coumadin 7.5 mg MWF and 5 mg on TTSS, but had planned 6 mg daily on discharge 2/28 to simplify, due to compliance issues.  Discharge postponed while other cardiac meds are being adjusted.  Goal of Therapy:  INR 2-3 Monitor platelets by anticoagulation protocol: Yes   Plan:   Coumadin 6 mg today as previously planned.  Daily PT/INR for now.  Dennie Fetters, RPh Pager: [phone] 01/08/2016,3:17 PM

## 2016-01-09 LAB — GLUCOSE, CAPILLARY
GLUCOSE-CAPILLARY: 99 mg/dL (ref 65–99)
Glucose-Capillary: 103 mg/dL — ABNORMAL HIGH (ref 65–99)
Glucose-Capillary: 128 mg/dL — ABNORMAL HIGH (ref 65–99)
Glucose-Capillary: 95 mg/dL (ref 65–99)

## 2016-01-09 LAB — PROTIME-INR
INR: 2.83 — AB (ref 0.00–1.49)
PROTHROMBIN TIME: 29.3 s — AB (ref 11.6–15.2)

## 2016-01-09 MED ORDER — INSULIN ASPART 100 UNIT/ML ~~LOC~~ SOLN
0.0000 [IU] | Freq: Three times a day (TID) | SUBCUTANEOUS | Status: DC
  Administered 2016-01-09: 2 [IU] via SUBCUTANEOUS

## 2016-01-09 MED ORDER — WARFARIN SODIUM 3 MG PO TABS
6.0000 mg | ORAL_TABLET | Freq: Every day | ORAL | Status: DC
  Administered 2016-01-09: 6 mg via ORAL
  Filled 2016-01-09: qty 2

## 2016-01-09 NOTE — Progress Notes (Signed)
ANTICOAGULATION CONSULT NOTE - Follow Up Consult  Pharmacy Consult for Coumadin Indication: atrial flutter  No Known Allergies  Patient Measurements: Height: 5\' 8"  (172.7 cm) Weight: 189 lb 11.2 oz (86.047 kg) (scale b) IBW/kg (Calculated) : 68.4   Vital Signs: Temp: 97.5 F (36.4 C) (03/02 0619) Temp Source: Oral (03/02 0619) BP: 130/53 mmHg (03/02 1137) Pulse Rate: 107 (03/02 1137)  Labs:  Recent Labs  01/07/16 0617 01/08/16 0508 01/09/16 0452  HGB 12.9*  --   --   HCT 40.8  --   --   PLT 144*  --   --   LABPROT 27.9* 27.6* 29.3*  INR 2.65* 2.61* 2.83*  CREATININE 1.32*  --   --     Estimated Creatinine Clearance: 59.5 mL/min (by C-G formula based on Cr of 1.32).  Assessment:   INR remains therapeutic (2.83). Heparin off since 2/27 when INR >2.   Previously on Coumadin 7.5 mg MWF and 5 mg on TTSS, but had planned 6 mg daily on discharge 2/28 to simplify, due to compliance issues.  Discharge postponed while other cardiac meds are being adjusted.   Coumadin 6 mg given on 3/1.  Goal of Therapy:  INR 2-3 Monitor platelets by anticoagulation protocol: Yes   Plan:   Coumadin 6 mg daily at 6pm.  Daily PT/INR for now.  Dennie Fetters, RPh Pager: [phone] 01/09/2016,2:38 PM

## 2016-01-09 NOTE — Progress Notes (Signed)
Patient Name: [name] Date of Encounter: 01/09/2016  Primary Cardiologist Dr. Gala Romney   Principal Problem:   Acute on chronic systolic heart failure [facility]) Active Problems:   Swelling of lower extremity   Depression   Type 2 diabetes, uncontrolled, with renal manifestation (HCC)   CKD (chronic kidney disease), stage II   Obesity (BMI 30-39.9)   HTN (hypertension)   Atrial flutter (HCC)   Elevated troponin   Psychiatric diagnosis deferred   Hypertensive heart disease with heart failure (HCC)   Acute on chronic systolic and diastolic heart failure, NYHA class 3 (HCC)   Anticoagulation adequate   Acute on chronic heart failure (HCC)    SUBJECTIVE  Denies any CP or SOB.   CURRENT MEDS . ARIPiprazole  2 mg Oral BID  . digoxin  0.125 mg Oral Daily  . escitalopram  10 mg Oral Daily  . furosemide  80 mg Oral BID  . insulin aspart  0-15 Units Subcutaneous TID WC  . insulin aspart  0-5 Units Subcutaneous QHS  . losartan  25 mg Oral Daily  . metoprolol succinate  100 mg Oral BID  . potassium chloride  40 mEq Oral Daily  . sodium chloride flush  3 mL Intravenous Q12H  . sodium chloride flush  3 mL Intravenous Q12H  . Warfarin - Pharmacist Dosing Inpatient   Does not apply q1800    OBJECTIVE  Filed Vitals:   01/08/16 1346 01/08/16 1351 01/08/16 2045 01/09/16 0619  BP:   119/65 119/78  Pulse: 40 40 79 118  Temp:   97.4 F (36.3 C) 97.5 F (36.4 C)  TempSrc:   Oral Oral  Resp:   18 20  Height:      Weight:    189 lb 11.2 oz (86.047 kg)  SpO2:   96% 95%    Intake/Output Summary (Last 24 hours) at 01/09/16 1042 Last data filed at 01/09/16 0908  Gross per 24 hour  Intake   1080 ml  Output   1475 ml  Net   -395 ml   Filed Weights   01/07/16 0436 01/08/16 0517 01/09/16 0619  Weight: 192 lb 12.8 oz (87.454 kg) 191 lb 3.2 oz (86.728 kg) 189 lb 11.2 oz (86.047 kg)    PHYSICAL EXAM  General: Pleasant, NAD. Neuro: Alert and oriented X 3. Moves all  extremities spontaneously. Psych: Normal affect. HEENT:  Normal  Neck: Supple without bruits or JVD. Lungs:  Resp regular and unlabored, CTA. Heart: irregular, tachycardic. no s3, s4, or murmurs. Abdomen: Soft, non-tender, non-distended, BS + x 4.  Extremities: No clubbing, cyanosis or edema. DP/PT/Radials 2+ and equal bilaterally.  Accessory Clinical Findings  CBC  Recent Labs  01/07/16 0617  WBC 6.3  HGB 12.9*  HCT 40.8  MCV 90.1  PLT 144*   Basic Metabolic Panel  Recent Labs  01/07/16 0617  NA 139  K 4.2  CL 103  CO2 26  GLUCOSE 113*  BUN 33*  CREATININE 1.32*  CALCIUM 9.1    TELE aflutter with HR 110s    ECG  No new EKG  Echocardiogram 12/30/2015  LV EF: 20% -  25%  ------------------------------------------------------------------- Indications:   CHF - 428.0.  ------------------------------------------------------------------- Study Conclusions  - Left ventricle: The cavity size was severely dilated. Systolic function was severely reduced. The estimated ejection fraction was in the range of 20% to 25%. Severe diffuse hypokinesis. There is akinesis of the basal-mid anteroseptal and apical anterior myocardium. There is akinesis of the  basalinferior myocardium. There is akinesis of the entireanterior myocardium. The tissue Doppler parameters were abnormal. The study is not technically sufficient to allow evaluation of LV diastolic function. - Aortic valve: Moderate thickening and calcification, consistent with sclerosis. - Mitral valve: There was mild regurgitation. - Left atrium: The atrium was severely dilated. - Right ventricle: Systolic function was moderately to severely reduced. - Tricuspid valve: There was mild regurgitation. - Pulmonary arteries: PA peak pressure: 51 mm Hg (S).  Impressions:  - The right ventricular systolic pressure was increased consistent with moderate pulmonary hypertension.     Radiology/Studies  Dg Chest 2 View  12/28/2015  CLINICAL DATA:  Acute chest pain shortness of breath, diabetes, hypertension EXAM: CHEST  2 VIEW COMPARISON:  05/08/2014 FINDINGS: Stable cardiomegaly with slight vascular congestion. Minor streaky bibasilar densities and trace pleural effusions. No definite focal pneumonia. No current CHF or pneumothorax. Trachea midline. Degenerative changes of the spine. IMPRESSION: cardiomegaly with trace pleural effusions and bibasilar atelectasis. Electronically Signed   By: Judie Petit.  Shick M.D.   On: 12/28/2015 12:45   Nm Myocar Multi W/spect W/wall Motion / Ef  12/31/2015  CLINICAL DATA:  Atrial fibrillation, CHF.  Diabetes, hypertension. EXAM: MYOCARDIAL IMAGING WITH SPECT (REST AND PHARMACOLOGIC-STRESS) GATED LEFT VENTRICULAR WALL MOTION STUDY LEFT VENTRICULAR EJECTION FRACTION TECHNIQUE: Standard myocardial SPECT imaging was performed after resting intravenous injection of 10 mCi Tc-42m sestamibi. Subsequently, intravenous infusion of Lexiscan was performed under the supervision of the Cardiology staff. At peak effect of the drug, 30 mCi Tc-15m sestamibi was injected intravenously and standard myocardial SPECT imaging was performed. Quantitative gated imaging was also performed to evaluate left ventricular wall motion, and estimate left ventricular ejection fraction. COMPARISON:  None. FINDINGS: Perfusion: Next defects noted on rest and stress images within the inferior wall and to lesser extent the septum. No reversible defects. Wall Motion: Severe generalized hypokinesia. Left ventricle is dilute Left Ventricular Ejection Fraction: 20 % End diastolic volume 218 ml End systolic volume 173 ml IMPRESSION: 1. 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 No evidence of ischemia. 2. Severe generalized hypokinesia with dilated left ventricle. 3. Left ventricular ejection fraction 20% 4. High-risk stress test findings*. *2012 Appropriate Use Criteria  for Coronary Revascularization Focused Update: J Am Coll Cardiol. 2012;59(9):857-881. http://content.[email or web address].aspx?articleid=[email or web address] Electronically Signed   By: Charlett Nose M.D.   On: 12/31/2015 14:09    ASSESSMENT AND PLAN  1. Acute on chronic systolic HF - Echo 05/09/2014 EF 25-30%, moderate LVH, peak PA pressure  - New echo: EF 20-25%, diffuse hypokinesis. There is akinesis of the basal-mid anteroseptal and apical anteriormyocardium. There is akinesis of the basalinferior myocardium. LA severely dilated. PA pressure .  - transitioned to PO lasix on 2/21, currently on 80mg  BID lasix, Cr stable. Euvolemic on exam today.   2. Atrial flutter with RVR  - CHA2DS2-Vasc score 4 (HTN, DM, age, HF), coumadin restarted, INR therapeutic  - s/p TEE on 2/27, positive for LAA thrombus, DDCV cancelled  - Toprol XL increased to 100mg  BID to help control HR which is still 110s most of the time, HR still between 80-120s. Long discussion with the patient regarding the need to be compliant with medications esp since converting him out of aflutter later would be the best modality to hopefully improve his LVEF on the long run. Unfortunately his HR has been very hard to manage, currently on 100mg  BID toprol XL and digoxin. This is probably the best we can get his HR  to slow down to. Would like to avoid diltiazem given LV dysfunction. No amiodarone given risk of conversion with known LAA clot. If HR continue to be difficult to manage, will consider add  daily Diltiazem CD. May need to consider initiate amiodarone loading if his coumadin level can be therapeutic for 3 weeks prior to outpatient DCCV, although left atrium is severely enlarged, R atrium is normal size. May also consider ablation if he can demonstrate compliance   3. Elevated trop with new LV dysfunction on echo - likely due to volume overload and renal issue  - Echo 12/30/2015 EF 20-25%, severe diffuse  hypokinesis, akinesis of basal-mid anteroseptal and apical anterior myocardium, mild MR, severely dilated LA, mild TR, PA peak pressure  - myoview 12/31/2015 old infarct/scar in the inferior wall, no evidence of ischemia, EF 20%, high risk stress test finding    4. Acute on chronic renal insufficiency - baseline Cr 1.3. Cr 1.6-1.7 on arrival, improved. Likely related to noncompliance with medication  5. OSA 6. CKD 7. DM 8. H/o afib 9. Psychosocial issue: Per hospital team. His chronic management will not be successful without these issues being addressed.   Ramond Dial PA-C Pager: [phone]   Patient seen and examined. Agree with assessment and plan. AF rate better controlled on Dig and toprol 100 mg bid, anticoagulated on warfarin.  INR 2.83. OK from cardiac perspective to dc today. He will ultimately need a f/u TEE in several months to see if resolution of LAA smoke and thrombus prior to attempt at cardioversion.    Lennette Bihari, MD, [facility] 01/09/2016 11:51 AM

## 2016-01-09 NOTE — Progress Notes (Signed)
[facility] PROGRESS NOTE  [name] ZOX:[id number] DOB: 1951-10-31 DOA: 12/28/2015 PCP: [name], MD  Assessment/Plan: Acute on chronic systolic heart failure -BNP 886 upon admission -Chest x-ray showed vascular congestion -Echocardiogram showed EF 20-25% with severe diffuse hypokinesis -Stress test 12/31/2015 showed old infarct/scar in the inferior wall and septum, no evidence of ischemia, severe generalized hypokinesia with EF of 20% -Continue diuresis, patient was placed on 80 of Lasix BID -Continue metoprolol, losartan Stable.   Atrial fibrillation with RVR -Patient had TEE DCCV July 2015, has been on Coumadin past however was not compliant -Continue digoxin, metoprolol -Continue Coumadin -INR 2.65 today -Spoke with patient regarding taking coumadin at discharge, he explained to me that he felt he was a "65 year old stuck in a 65 year old body" and did know if he would take all of these medications -TTE showed LA thrombus, cardioversion cancelled. Patient will have to continue coumadin for several months, before DCCV can be re-attempted. Metoprolol increase to 100 mg BID.  He denies dizziness.   Elevated troponin -Likely secondary to the above -Peak 0.48, consistent with demand ischemia in the setting of CHF and he flutter with RVR  Essential hypertension -Continue Lasix, metoprolol, losartan -Patient has been noncompliant, was supposed to be on Coreg, Lasix, lisinopril however has not taken these medications in over a year  Acute on chronic kidney disease, stage III -Creatinine 1.6 on admission, currently 1.32 -Continue to monitor BMP  Depression/psychosocial issues/noncompliance -Psychiatry consulted and appreciated-did not feel patient was candidate for inpatient psych admission, recommended Lexapro and Abilify -Counseled patient on the importance of taking his medications -Patient appears afraid of going home. He said I am  like a 65 year old. If  you could turn my brain off. My thoughts  -will ask Psych evaluation for capacity   Diabetes mellitus, type II -Hemoglobin A1c 6 -Continue home regimen at discharge -Follow up with PCP  Code Status: full code.  Family Communication: care discussed with patient.  Disposition Plan: home probably tomorrow.  Psych evaluation.    Consultants:  Cardiology   Procedures: Echocardiogram Stress test Transesophageal echocardiogram  Antibiotics:  none  HPI/Subjective: Appears afraid of going home, unclear if patient will be able to handle and take medications.  Will ask psych evaluation for capacity.  Patient refuse HH nurse  Objective: Filed Vitals:   01/09/16 0619 01/09/16 1137  BP: 119/78 130/53  Pulse: 118 107  Temp: 97.5 F (36.4 C)   Resp: 20 20    Intake/Output Summary (Last 24 hours) at 01/09/16 1558 Last data filed at 01/09/16 1521  Gross per 24 hour  Intake   1080 ml  Output   2025 ml  Net   -945 ml   Filed Weights   01/07/16 0436 01/08/16 0517 01/09/16 0619  Weight: 87.454 kg (192 lb 12.8 oz) 86.728 kg (191 lb 3.2 oz) 86.047 kg (189 lb 11.2 oz)    Exam:   General:  NAD  Cardiovascular: S 1, S 2 RRR  Respiratory: CTA  Abdomen: BS present, soft, nt  Musculoskeletal: no edema  Data Reviewed: Basic Metabolic Panel:  Recent Labs Lab 01/04/16 0224 01/05/16 0516 01/05/16 1154 01/06/16 0340 01/07/16 0617  NA 138 140 139 139 139  K 3.9 4.1 4.4 3.9 4.2  CL 102 102 101 103 103  CO2 GLUCOSE 105* 113* 119* 119* 113*  BUN 30* 27* 27* 30* 33*  CREATININE 1.50* 1.37* 1.39* 1.38* 1.32*  CALCIUM 8.9  9.5 9.4 9.0 9.1  MG  --   --  2.3  --   --    Liver Function Tests: No results for input(s): AST, ALT, ALKPHOS, BILITOT, PROT, ALBUMIN in the last 168 hours. No results for input(s): LIPASE, AMYLASE in the last 168 hours. No results for input(s): AMMONIA in the last 168 hours. CBC:  Recent Labs Lab 01/03/16 0420 01/04/16 0224  01/05/16 0516 01/06/16 0340 01/07/16 0617  WBC 7.0 5.3 6.2 6.3 6.3  HGB 12.4* 12.0* 12.9* 12.3* 12.9*  HCT 39.4 39.4 40.2 41.0 40.8  MCV 92.1 90.8 88.9 90.7 90.1  PLT 128* 124* 133* 135* 144*   Cardiac Enzymes: No results for input(s): CKTOTAL, CKMB, CKMBINDEX, TROPONINI in the last 168 hours. BNP (last 3 results)  Recent Labs  12/28/15 1449  BNP 886.8*    ProBNP (last 3 results) No results for input(s): PROBNP in the last 8760 hours.  CBG:  Recent Labs Lab 01/08/16 1124 01/08/16 1610 01/08/16 2045 01/09/16 0704 01/09/16 1134  GLUCAP 106* 146* 93 103* 128*    No results found for this or any previous visit (from the past 240 hour(s)).   Studies: No results found.  Scheduled Meds: . ARIPiprazole  2 mg Oral BID  . digoxin  0.125 mg Oral Daily  . escitalopram  10 mg Oral Daily  . furosemide  80 mg Oral BID  . insulin aspart  0-15 Units Subcutaneous TID WC  . insulin aspart  0-5 Units Subcutaneous QHS  . losartan  25 mg Oral Daily  . metoprolol succinate  100 mg Oral BID  . potassium chloride  40 mEq Oral Daily  . sodium chloride flush  3 mL Intravenous Q12H  . sodium chloride flush  3 mL Intravenous Q12H  . warfarin  6 mg Oral q1800  . Warfarin - Pharmacist Dosing Inpatient   Does not apply q1800   Continuous Infusions: . sodium chloride 250 mL (01/05/16 1100)    Principal Problem:   Acute on chronic systolic heart failure (HCC) Active Problems:   Swelling of lower extremity   Depression   Type 2 diabetes, uncontrolled, with renal manifestation (HCC)   CKD (chronic kidney disease), stage II   Obesity (BMI 30-39.9)   HTN (hypertension)   Atrial flutter (HCC)   Elevated troponin   Psychiatric diagnosis deferred   Hypertensive heart disease with heart failure (HCC)   Acute on chronic systolic and diastolic heart failure, NYHA class 3 (HCC)   Anticoagulation adequate   Acute on chronic heart failure (HCC)    Time spent: 25 minutes.      Hartley Barefoot A  [facility] Pager [phone]. If 7PM-7AM, please contact night-coverage at [email or web address], password [facility] 01/09/2016, 3:58 PM  LOS: 12 days

## 2016-01-09 NOTE — Care Management Important Message (Signed)
Important Message  Patient Details  Name: [name] MRN: [id number] Date of Birth: [birth year]   Medicare Important Message Given:  Yes    Kristal Perl P Peng Thorstenson 01/09/2016, 2:11 PM

## 2016-01-10 LAB — GLUCOSE, CAPILLARY
GLUCOSE-CAPILLARY: 110 mg/dL — AB (ref 65–99)
GLUCOSE-CAPILLARY: 112 mg/dL — AB (ref 65–99)

## 2016-01-10 LAB — PROTIME-INR
INR: 2.73 — AB (ref 0.00–1.49)
PROTHROMBIN TIME: 28.5 s — AB (ref 11.6–15.2)

## 2016-01-10 MED ORDER — LOSARTAN POTASSIUM 25 MG PO TABS: 25.0000 mg | ORAL_TABLET | Freq: Every day | ORAL | Status: DC

## 2016-01-10 MED ORDER — METOPROLOL SUCCINATE ER 100 MG PO TB24: 100.0000 mg | ORAL_TABLET | Freq: Two times a day (BID) | ORAL | Status: DC

## 2016-01-10 MED ORDER — WARFARIN SODIUM 6 MG PO TABS: 6.0000 mg | ORAL_TABLET | Freq: Every day | ORAL | Status: DC

## 2016-01-10 MED ORDER — FUROSEMIDE 10 MG/ML IJ SOLN
40.0000 mg | Freq: Once | INTRAMUSCULAR | Status: DC
  Filled 2016-01-10: qty 4

## 2016-01-10 MED ORDER — DIGOXIN 125 MCG PO TABS
0.1250 mg | ORAL_TABLET | Freq: Every day | ORAL | Status: DC
Start: 2016-01-10 — End: 2016-03-23

## 2016-01-10 MED ORDER — ESCITALOPRAM OXALATE 10 MG PO TABS: 10.0000 mg | ORAL_TABLET | Freq: Every day | ORAL | Status: DC

## 2016-01-10 MED ORDER — FUROSEMIDE 80 MG PO TABS: 80.0000 mg | ORAL_TABLET | Freq: Two times a day (BID) | ORAL | Status: DC

## 2016-01-10 MED ORDER — ARIPIPRAZOLE 2 MG PO TABS: 2.0000 mg | ORAL_TABLET | Freq: Two times a day (BID) | ORAL | Status: DC

## 2016-01-10 MED FILL — METOPROLOL SUCC ER 25 MG TA: 25 | 30 days supply | Qty: 180 | Fill #0

## 2016-01-10 MED FILL — LOSARTAN POTASSIUM 25 MG TA: 25 | 30 days supply | Qty: 30 | Fill #0

## 2016-01-10 MED FILL — WARFARIN SODIUM 6 MG TABLET: 6 | 30 days supply | Qty: 30 | Fill #0

## 2016-01-10 MED FILL — DIGITEK 125 MCG TABLET: 125 | 30 days supply | Qty: 30 | Fill #0

## 2016-01-10 MED FILL — FUROSEMIDE 80 MG TABLET: 80 | 30 days supply | Qty: 60 | Fill #0

## 2016-01-10 NOTE — Discharge Summary (Signed)
Physician Discharge Summary  [name] QBV:[id number] DOB: 06-Feb-1951 DOA: 12/28/2015  PCP: [name], MD  Admit date: 12/28/2015 Discharge date: 01/10/2016  Time spent: 35 minutes  Recommendations for Outpatient Follow-up:  Needs INR and Bmet    Discharge Diagnoses:    Acute on chronic systolic heart failure (HCC)   Swelling of lower extremity   Depression   Type 2 diabetes, uncontrolled, with renal manifestation (HCC)   CKD (chronic kidney disease), stage II   Obesity (BMI 30-39.9)   HTN (hypertension)   Atrial flutter (HCC)   Elevated troponin   Psychiatric diagnosis deferred   Hypertensive heart disease with heart failure (HCC)   Acute on chronic systolic and diastolic heart failure, NYHA class 3 (HCC)   Anticoagulation adequate   Acute on chronic heart failure [facility])   Discharge Condition: stable  Diet recommendation: heart healthy   Filed Weights   01/08/16 0517 01/09/16 0619 01/10/16 0601  Weight: 86.728 kg (191 lb 3.2 oz) 86.047 kg (189 lb 11.2 oz) 85.866 kg (189 lb 4.8 oz)    History of present illness:  [name] is a 65 y.o. male with a past medical history that includes type 2 diabetes uncontrolled, chronic kidney disease stage III, obesity, lower extremity edema, hypertension, chronic systolic heart failure, obstructive sleep apnea A. fib noncompliant with Coumadin presents to the emergency department with chief complaint of worsening lower extremity edema. Initial evaluation reveals acute on chronic systolic heart failure with an elevated troponin.  Information is obtained from the patient. He reports noncompliance with medication and follow-up medical care since 2015. He states he came to the emergency department when his legs swelled to the point that it caused him pain and made it difficult to walk. He denies any chest pain palpitations headache dizziness syncope or near-syncope. He denies orthopnea cough nausea vomiting. He denies dysuria  hematuria frequency or urgency. 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   Atrial fibrillation with RVR -Patient had TEE DCCV July 2015, has been on Coumadin past however was not compliant -Continue digoxin, metoprolol -Continue Coumadin -INR 2.65 today -Spoke with patient regarding taking coumadin at discharge, he explained to me that he felt he was a "65 year old stuck in a 65 year old body" and did know if he would take all of these medications -TTE showed LA thrombus, cardioversion cancelled. Patient will have to continue coumadin for several months, before DCCV can be re-attempted. Metoprolol increase to 100 mg BID.  He denies dizziness.  Better controlled.   Elevated troponin -Likely secondary to the above -Peak 0.48, consistent with demand ischemia in the setting of CHF and he flutter with RVR  Essential hypertension -Continue Lasix, metoprolol, losartan -Patient has been noncompliant, was supposed to be on Coreg, Lasix, lisinopril however has not taken these medications in over a year  Acute on chronic kidney disease, stage III -Creatinine 1.6 on admission, currently 1.32 -Continue to monitor BMP  Depression/psychosocial issues/noncompliance -Psychiatry consulted and appreciated-did not feel patient was candidate for inpatient psych admission, recommended Lexapro and Abilify -Counseled patient on the importance of taking his medications -  Per psych patient has capacity   Diabetes mellitus, type II -Hemoglobin A1c 6 -Continue  home regimen at discharge -Follow up with PCP  Procedures:  none  Consultations:  Cardiology   Discharge Exam: Filed Vitals:   01/10/16 0908 01/10/16 1140  BP: 126/56 114/66  Pulse: 88 81  Temp: 97.9 F (36.6 C) 98.3 F (36.8 C)  Resp: 18 18    General: NAD Cardiovascular: S 1, S 2 RRR Respiratory: CTA  Discharge Instructions   Discharge Instructions    Diet - low sodium heart healthy    Complete by:  As directed      Discharge instructions    Complete by:  As directed   Patient will be discharged to home.  Patient will need to follow up with primary care provider within one week of discharge.  Patient should continue medications as prescribed.  Patient should follow a heart healthy/carb modified diet. Follow up with cardiology at the specified time.     Increase activity slowly    Complete by:  As directed           Current Discharge Medication List    START taking these medications   Details  ARIPiprazole (ABILIFY) 2 MG tablet Take 1 tablet (2 mg total) by mouth 2 (two) times daily. Qty: 30 tablet, Refills: 0    escitalopram (LEXAPRO) 10 MG tablet Take 1 tablet (10 mg total) by mouth daily. Qty: 30 tablet, Refills: 0    losartan (COZAAR) 25 MG tablet Take 1 tablet (25 mg total) by mouth daily. Qty: 30 tablet, Refills: 0    metoprolol succinate (TOPROL-XL) 100 MG 24 hr tablet Take 1 tablet (100 mg total) by mouth 2 (two) times daily. Take with or immediately following a meal. Qty: 60 tablet, Refills: 0    potassium chloride SA (K-DUR,KLOR-CON) 10 MEQ tablet Take 1 tablet (10 mEq total) by mouth daily. Qty: 30 tablet, Refills: 0      CONTINUE these medications which have CHANGED   Details  digoxin (LANOXIN) 0.125 MG tablet Take 1 tablet (0.125 mg total) by mouth daily. Qty: 30 tablet, Refills: 0    furosemide (LASIX) 80 MG tablet Take 1 tablet (80 mg total) by mouth 2 (two) times daily. Qty: 60 tablet, Refills: 0    warfarin (COUMADIN) 6 MG tablet  Take 1 tablet (6 mg total) by mouth daily. Qty: 30 tablet, Refills: 0      CONTINUE these medications which have NOT CHANGED   Details  hydrOXYzine (ATARAX/VISTARIL) 25 MG tablet Take 1 tablet (25 mg total) by mouth every 8 (eight) hours as needed for itching. Qty: 30 tablet, Refills: 0    permethrin (ELIMITE) 5 % cream Apply from chin to toes, leave 8 hours, then shower. Repeat x 1 in one week. Qty: 60 g, Refills: 1      STOP taking these medications     carvedilol (COREG) 12.5 MG tablet      lisinopril (PRINIVIL,ZESTRIL) 2.5 MG tablet      metFORMIN (GLUCOPHAGE) 500 MG tablet      spironolactone (ALDACTONE) 25 MG tablet        No Known Allergies Follow-up Information    Follow up with [facility].   Contact information:   [address] [place] [address] [phone]      Follow up with [facility] On 01/21/2016.   Specialty:  Cardiology   Why:  9:20AM. Cardiology  followup with Dr. Gala Romney. Garage Code: Glass blower/designer information:   [address] [id number] mc [place] [address] [phone]      Follow up with [facility] On 01/13/2016.   Specialty:  Cardiology   Why:  coumadin clinic visit to manage the dosage of the blood thinner. 10:30AM.    Contact information:   [address] [place] [address] [phone]       The results of significant diagnostics from this hospitalization (including imaging, microbiology, ancillary and laboratory) are listed below for reference.    Significant Diagnostic Studies: Dg Chest 2 View  12/28/2015  CLINICAL DATA:  Acute chest pain shortness of breath, diabetes, hypertension EXAM: CHEST  2 VIEW COMPARISON:  05/08/2014 FINDINGS: Stable cardiomegaly with slight vascular congestion. Minor streaky bibasilar densities and trace pleural effusions. No definite  focal pneumonia. No current CHF or pneumothorax. Trachea midline. Degenerative changes of the spine. IMPRESSION: cardiomegaly with trace pleural effusions and bibasilar atelectasis. Electronically Signed   By: Judie Petit.  Shick M.D.   On: 12/28/2015 12:45   Nm Myocar Multi W/spect W/wall Motion / Ef  12/31/2015  CLINICAL DATA:  Atrial fibrillation, CHF.  Diabetes, hypertension. EXAM: MYOCARDIAL IMAGING WITH SPECT (REST AND PHARMACOLOGIC-STRESS) GATED LEFT VENTRICULAR WALL MOTION STUDY LEFT VENTRICULAR EJECTION FRACTION TECHNIQUE: Standard myocardial SPECT imaging was performed after resting intravenous injection of 10 mCi Tc-54m sestamibi. Subsequently, intravenous infusion of Lexiscan was performed under the supervision of the Cardiology staff. At peak effect of the drug, 30 mCi Tc-63m sestamibi was injected intravenously and standard myocardial SPECT imaging was performed. Quantitative gated imaging was also performed to evaluate left ventricular wall motion, and estimate left ventricular ejection fraction. COMPARISON:  None. FINDINGS: Perfusion: Next defects noted on rest and stress images within the inferior wall and to lesser extent the septum. No reversible defects. Wall Motion: Severe generalized hypokinesia. Left ventricle is dilute Left Ventricular Ejection Fraction: 20 % End diastolic volume 218 ml End systolic volume 173 ml IMPRESSION: 1. 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 No evidence of ischemia. 2. Severe generalized hypokinesia with dilated left ventricle. 3. Left ventricular ejection fraction 20% 4. High-risk stress test findings*. *2012 Appropriate Use Criteria for Coronary Revascularization Focused Update: J Am Coll Cardiol. 2012;59(9):857-881. http://content.dementiazones.com.aspx?articleid=[email or web address] Electronically Signed   By: Charlett Nose M.D.   On: 12/31/2015 14:09    Microbiology: No results found for this or any previous visit (from the past 240 hour(s)).    Labs: Basic Metabolic Panel:  Recent Labs Lab 01/04/16 0224 01/05/16 0516 01/05/16 1154 01/06/16 0340 01/07/16 0617  NA 138 140 139 139 139  K 3.9 4.1 4.4 3.9 4.2  CL 102 102 101 103 103  CO2 28 26 28 27 26   GLUCOSE 105* 113* 119* 119* 113*  BUN 30* 27* 27* 30* 33*  CREATININE 1.50* 1.37* 1.39* 1.38* 1.32*  CALCIUM 8.9 9.5 9.4 9.0 9.1  MG  --   --  2.3  --   --    Liver Function Tests: No results for input(s): AST, ALT, ALKPHOS, BILITOT, PROT, ALBUMIN in the last 168 hours. No results for input(s): LIPASE, AMYLASE in the last 168 hours. No results for input(s): AMMONIA in the last 168 hours. CBC:  Recent Labs Lab 01/04/16 0224 01/05/16 0516 01/06/16 0340 01/07/16 0617  WBC 5.3 6.2 6.3 6.3  HGB 12.0* 12.9* 12.3* 12.9*  HCT 39.4 40.2 41.0 40.8  MCV 90.8 88.9  90.7 90.1  PLT 124* 133* 135* 144*   Cardiac Enzymes: No results for input(s): CKTOTAL, CKMB, CKMBINDEX, TROPONINI in the last 168 hours. BNP: BNP (last 3 results)  Recent Labs  12/28/15 1449  BNP 886.8*    ProBNP (last 3 results) No results for input(s): PROBNP in the last 8760 hours.  CBG:  Recent Labs Lab 01/09/16 1134 01/09/16 1627 01/09/16 2125 01/10/16 0626 01/10/16 1142  GLUCAP 128* 99 95 110* 112*       Signed:  Hartley Barefoot A MD.  [facility] 01/10/2016, 12:51 PM

## 2016-01-10 NOTE — Progress Notes (Signed)
Pt has orders to be discharged. Discharge instructions given and pt has no additional questions at this time. Medication regimen reviewed and pt educated. Pt verbalized understanding and has no additional questions. Telemetry box removed. Pt stable and waiting for transportation. Patient verbalizes understanding that he is to pick up prescriptions by 5:30pm today.

## 2016-01-10 NOTE — Progress Notes (Signed)
ANTICOAGULATION CONSULT NOTE - Follow Up Consult  Pharmacy Consult for Coumadin Indication: atrial flutter  No Known Allergies  Patient Measurements: Height: 5\' 8"  (172.7 cm) Weight: 189 lb 4.8 oz (85.866 kg) IBW/kg (Calculated) : 68.4   Vital Signs: Temp: 97.9 F (36.6 C) (03/03 0908) Temp Source: Oral (03/03 0908) BP: 126/56 mmHg (03/03 0908) Pulse Rate: 88 (03/03 0908)  Labs:  Recent Labs  01/08/16 0508 01/09/16 0452 01/10/16 0345  LABPROT 27.6* 29.3* 28.5*  INR 2.61* 2.83* 2.73*    Estimated Creatinine Clearance: 59.5 mL/min (by C-G formula based on Cr of 1.32).  Assessment: 65 yo male presented on 2/18 with LE edema  PMH: DM2, CKD3, Obestity, edema, HTN, CHF, OSA, Afib (noncompliant w/ warfarin)  AC: Warfarin for aflutter and LA thrombus per TEE on 2/27 (CHADSVASC 4) - pt with hx of non-compliance. INR 2.73  Home Coumadin dose listed at 7.5 mg MWF and 5 mg TTSS (Admit INR 1.45)  CV: TEE: EF down to 10-15%. LA thrombus. On digoxin 0.125, lasix 80 po BID, losartan 25, Toprol 100 BID, KCl 40 daily  Nephro: No recent labs  Heme/Onc: H&H 12.9/40.8, Plt 144 (2/28)  Goal of Therapy:  INR 2-3 Monitor platelets by anticoagulation protocol: Yes   Plan:  Coumadin 6 mg daily Daily INR, CBC q72h (ordered for am) Monitor for s/sx of bleeding  If patient discharges today or over weekend, it looks like patient would be stable on warfarin 6 mg daily. Pharmacy will adjust this recommendation over the weekend if labs change.  Isaac Bliss, PharmD, BCPS, [facility] Clinical Pharmacist Pager [phone] 01/10/2016 10:24 AM

## 2016-01-10 NOTE — Discharge Instructions (Signed)
Atrial Fibrillation °Atrial fibrillation is a type of irregular or rapid heartbeat (arrhythmia). In atrial fibrillation, the heart quivers continuously in a chaotic pattern. This occurs when parts of the heart receive disorganized signals that make the heart unable to pump blood normally. This can increase the risk for stroke, heart failure, and other heart-related conditions. There are different types of atrial fibrillation, including: °· Paroxysmal atrial fibrillation. This type starts suddenly, and it usually stops on its own shortly after it starts. °· Persistent atrial fibrillation. This type often lasts longer than a week. It may stop on its own or with treatment. °· Long-lasting persistent atrial fibrillation. This type lasts longer than 12 months. °· Permanent atrial fibrillation. This type does not go away. °Talk with your health care provider to learn about the type of atrial fibrillation that you have. °CAUSES °This condition is caused by some heart-related conditions or procedures, including: °· A heart attack. °· Coronary artery disease. °· Heart failure. °· Heart valve conditions. °· High blood pressure. °· Inflammation of the sac that surrounds the heart (pericarditis). °· Heart surgery. °· Certain heart rhythm disorders, such as Wolf-Parkinson-White syndrome. °Other causes include: °· Pneumonia. °· Obstructive sleep apnea. °· Blockage of an artery in the lungs (pulmonary embolism, or PE). °· Lung cancer. °· Chronic lung disease. °· Thyroid problems, especially if the thyroid is overactive (hyperthyroidism). °· Caffeine. °· Excessive alcohol use or illegal drug use. °· Use of some medicines, including certain decongestants and diet pills. °Sometimes, the cause cannot be found. °RISK FACTORS °This condition is more likely to develop in: °· People who are older in age. °· People who smoke. °· People who have diabetes mellitus. °· People who are overweight (obese). °· Athletes who exercise  vigorously. °SYMPTOMS °Symptoms of this condition include: °· A feeling that your heart is beating rapidly or irregularly. °· A feeling of discomfort or pain in your chest. °· Shortness of breath. °· Sudden light-headedness or weakness. °· Getting tired easily during exercise. °In some cases, there are no symptoms. °DIAGNOSIS °Your health care provider may be able to detect atrial fibrillation when taking your pulse. If detected, this condition may be diagnosed with: °· An electrocardiogram (ECG). °· A Holter monitor test that records your heartbeat patterns over a 24-hour period. °· Transthoracic echocardiogram (TTE) to evaluate how blood flows through your heart. °· Transesophageal echocardiogram (TEE) to view more detailed images of your heart. °· A stress test. °· Imaging tests, such as a CT scan or chest X-ray. °· Blood tests. °TREATMENT °The main goals of treatment are to prevent blood clots from forming and to keep your heart beating at a normal rate and rhythm. The type of treatment that you receive depends on many factors, such as your underlying medical conditions and how you feel when you are experiencing atrial fibrillation. °This condition may be treated with: °· Medicine to slow down the heart rate, bring the heart's rhythm back to normal, or prevent clots from forming. °· Electrical cardioversion. This is a procedure that resets your heart's rhythm by delivering a controlled, low-energy shock to the heart through your skin. °· Different types of ablation, such as catheter ablation, catheter ablation with pacemaker, or surgical ablation. These procedures destroy the heart tissues that send abnormal signals. When the pacemaker is used, it is placed under your skin to help your heart beat in a regular rhythm. °HOME CARE INSTRUCTIONS °· Take over-the counter and prescription medicines only as told by your health care provider. °·   If your health care provider prescribed a blood-thinning medicine  (anticoagulant), take it exactly as told. Taking too much blood-thinning medicine can cause bleeding. If you do not take enough blood-thinning medicine, you will not have the protection that you need against stroke and other problems. °· Do not use tobacco products, including cigarettes, chewing tobacco, and e-cigarettes. If you need help quitting, ask your health care provider. °· If you have obstructive sleep apnea, manage your condition as told by your health care provider. °· Do not drink alcohol. °· Do not drink beverages that contain caffeine, such as coffee, soda, and tea. °· Maintain a healthy weight. Do not use diet pills unless your health care provider approves. Diet pills may make heart problems worse. °· Follow diet instructions as told by your health care provider. °· Exercise regularly as told by your health care provider. °· Keep all follow-up visits as told by your health care provider. This is important. °PREVENTION °· Avoid drinking beverages that contain caffeine or alcohol. °· Avoid certain medicines, especially medicines that are used for breathing problems. °· Avoid certain herbs and herbal medicines, such as those that contain ephedra or ginseng. °· Do not use illegal drugs, such as cocaine and amphetamines. °· Do not smoke. °· Manage your high blood pressure. °SEEK MEDICAL CARE IF: °· You notice a change in the rate, rhythm, or strength of your heartbeat. °· You are taking an anticoagulant and you notice increased bruising. °· You tire more easily when you exercise or exert yourself. °SEEK IMMEDIATE MEDICAL CARE IF: °· You have chest pain, abdominal pain, sweating, or weakness. °· You feel nauseous. °· You notice blood in your vomit, bowel movement, or urine. °· You have shortness of breath. °· You suddenly have swollen feet and ankles. °· You feel dizzy. °· You have sudden weakness or numbness of the face, arm, or leg, especially on one side of the body. °· You have trouble speaking,  trouble understanding, or both (aphasia). °· Your face or your eyelid droops on one side. °These symptoms may represent a serious problem that is an emergency. Do not wait to see if the symptoms will go away. Get medical help right away. Call your local emergency services (911 in the [place] Do not drive yourself to the hospital. °  °This information is not intended to replace advice given to you by your health care provider. Make sure you discuss any questions you have with your health care provider. °  °Document Released: 10/26/2005 Document Revised: 07/17/2015 Document Reviewed: 02/20/2015 °Elsevier Interactive Patient Education ©2016 [employer] °Heart Failure °Heart failure is a condition in which the heart has trouble pumping blood. This means your heart does not pump blood efficiently for your body to work well. In some cases of heart failure, fluid may back up into your lungs or you may have swelling (edema) in your lower legs. Heart failure is usually a long-term (chronic) condition. It is important for you to take good care of yourself and follow your health care provider's treatment plan. °CAUSES  °Some health conditions can cause heart failure. Those health conditions include: °· High blood pressure (hypertension). Hypertension causes the heart muscle to work harder than normal. When pressure in the blood vessels is high, the heart needs to pump (contract) with more force in order to circulate blood throughout the body. High blood pressure eventually causes the heart to become stiff and weak. °· Coronary artery disease (CAD). CAD is the buildup of cholesterol and fat (  plaque) in the arteries of the heart. The blockage in the arteries deprives the heart muscle of oxygen and blood. This can cause chest pain and may lead to a heart attack. High blood pressure can also contribute to CAD. °· Heart attack (myocardial infarction). A heart attack occurs when one or more arteries in the heart become blocked. The  loss of oxygen damages the muscle tissue of the heart. When this happens, part of the heart muscle dies. The injured tissue does not contract as well and weakens the heart's ability to pump blood. °· Abnormal heart valves. When the heart valves do not open and close properly, it can cause heart failure. This makes the heart muscle pump harder to keep the blood flowing. °· Heart muscle disease (cardiomyopathy or myocarditis). Heart muscle disease is damage to the heart muscle from a variety of causes. These can include drug or alcohol abuse, infections, or unknown reasons. These can increase the risk of heart failure. °· Lung disease. Lung disease makes the heart work harder because the lungs do not work properly. This can cause a strain on the heart, leading it to fail. °· Diabetes. Diabetes increases the risk of heart failure. High blood sugar contributes to high fat (lipid) levels in the blood. Diabetes can also cause slow damage to tiny blood vessels that carry important nutrients to the heart muscle. When the heart does not get enough oxygen and food, it can cause the heart to become weak and stiff. This leads to a heart that does not contract efficiently. °· Other conditions can contribute to heart failure. These include abnormal heart rhythms, thyroid problems, and low blood counts (anemia). °Certain unhealthy behaviors can increase the risk of heart failure, including: °· Being overweight. °· Smoking or chewing tobacco. °· Eating foods high in fat and cholesterol. °· Abusing illicit drugs or alcohol. °· Lacking physical activity. °SYMPTOMS  °Heart failure symptoms may vary and can be hard to detect. Symptoms may include: °· Shortness of breath with activity, such as climbing stairs. °· Persistent cough. °· Swelling of the feet, ankles, legs, or abdomen. °· Unexplained weight gain. °· Difficulty breathing when lying flat (orthopnea). °· Waking from sleep because of the need to sit up and get more air. °· Rapid  heartbeat. °· Fatigue and loss of energy. °· Feeling light-headed, dizzy, or close to fainting. °· Loss of appetite. °· Nausea. °· Increased urination during the night (nocturia). °DIAGNOSIS  °A diagnosis of heart failure is based on your history, symptoms, physical examination, and diagnostic tests. Diagnostic tests for heart failure may include: °· Echocardiography. °· Electrocardiography. °· Chest X-ray. °· Blood tests. °· Exercise stress test. °· Cardiac angiography. °· Radionuclide scans. °TREATMENT  °Treatment is aimed at managing the symptoms of heart failure. Medicines, behavioral changes, or surgical intervention may be necessary to treat heart failure. °· Medicines to help treat heart failure may include: °¨ Angiotensin-converting enzyme (ACE) inhibitors. This type of medicine blocks the effects of a blood protein called angiotensin-converting enzyme. ACE inhibitors relax (dilate) the blood vessels and help lower blood pressure. °¨ Angiotensin receptor blockers (ARBs). This type of medicine blocks the actions of a blood protein called angiotensin. Angiotensin receptor blockers dilate the blood vessels and help lower blood pressure. °¨ Water pills (diuretics). Diuretics cause the kidneys to remove salt and water from the blood. The extra fluid is removed through urination. This loss of extra fluid lowers the volume of blood the heart pumps. °¨ Beta blockers. These prevent the heart   from beating too fast and improve heart muscle strength.  Digitalis. This increases the force of the heartbeat.  Healthy behavior changes include:  Obtaining and maintaining a healthy weight.  Stopping smoking or chewing tobacco.  Eating heart-healthy foods.  Limiting or avoiding alcohol.  Stopping illicit drug use.  Physical activity as directed by your health care provider.  Surgical treatment for heart failure may include:  A procedure to open blocked arteries, repair damaged heart valves, or remove damaged  heart muscle tissue.  A pacemaker to improve heart muscle function and control certain abnormal heart rhythms.  An internal cardioverter defibrillator to treat certain serious abnormal heart rhythms.  A left ventricular assist device (LVAD) to assist the pumping ability of the heart. HOME CARE INSTRUCTIONS   Take medicines only as directed by your health care provider. Medicines are important in reducing the workload of your heart, slowing the progression of heart failure, and improving your symptoms.  Do not stop taking your medicine unless directed by your health care provider.  Do not skip any dose of medicine.  Refill your prescriptions before you run out of medicine. Your medicines are needed every day.  Engage in moderate physical activity if directed by your health care provider. Moderate physical activity can benefit some people. The elderly and people with severe heart failure should consult with a health care provider for physical activity recommendations.  Eat heart-healthy foods. Food choices should be free of trans fat and low in saturated fat, cholesterol, and salt (sodium). Healthy choices include fresh or frozen fruits and vegetables, fish, lean meats, legumes, fat-free or low-fat dairy products, and whole grain or high fiber foods. Talk to a dietitian to learn more about heart-healthy foods.  Limit sodium if directed by your health care provider. Sodium restriction may reduce symptoms of heart failure in some people. Talk to a dietitian to learn more about heart-healthy seasonings.  Use healthy cooking methods. Healthy cooking methods include roasting, grilling, broiling, baking, poaching, steaming, or stir-frying. Talk to a dietitian to learn more about healthy cooking methods.  Limit fluids if directed by your health care provider. Fluid restriction may reduce symptoms of heart failure in some people.  Weigh yourself every day. Daily weights are important in the early  recognition of excess fluid. You should weigh yourself every morning after you urinate and before you eat breakfast. Wear the same amount of clothing each time you weigh yourself. Record your daily weight. Provide your health care provider with your weight record.  Monitor and record your blood pressure if directed by your health care provider.  Check your pulse if directed by your health care provider.  Lose weight if directed by your health care provider. Weight loss may reduce symptoms of heart failure in some people.  Stop smoking or chewing tobacco. Nicotine makes your heart work harder by causing your blood vessels to constrict. Do not use nicotine gum or patches before talking to your health care provider.  Keep all follow-up visits as directed by your health care provider. This is important.  Limit alcohol intake to no more than 1 drink per day for nonpregnant women and 2 drinks per day for men. One drink equals 12 ounces of beer, 5 ounces of wine, or 1 ounces of hard liquor. Drinking more than that is harmful to your heart. Tell your health care provider if you drink alcohol several times a week. Talk with your health care provider about whether alcohol is safe for you. If your  heart has already been damaged by alcohol or you have severe heart failure, drinking alcohol should be stopped completely.  Stop illicit drug use.  Stay up-to-date with immunizations. It is especially important to prevent respiratory infections through current pneumococcal and influenza immunizations.  Manage other health conditions such as hypertension, diabetes, thyroid disease, or abnormal heart rhythms as directed by your health care provider.  Learn to manage stress.  Plan rest periods when fatigued.  Learn strategies to manage high temperatures. If the weather is extremely hot:  Avoid vigorous physical activity.  Use air conditioning or fans or seek a cooler location.  Avoid caffeine and  alcohol.  Wear loose-fitting, lightweight, and light-colored clothing.  Learn strategies to manage cold temperatures. If the weather is extremely cold:  Avoid vigorous physical activity.  Layer clothes.  Wear mittens or gloves, a hat, and a scarf when going outside.  Avoid alcohol.  Obtain ongoing education and support as needed.  Participate in or seek rehabilitation as needed to maintain or improve independence and quality of life. SEEK MEDICAL CARE IF:   You have a rapid weight gain.  You have increasing shortness of breath that is unusual for you.  You are unable to participate in your usual physical activities.  You tire easily.  You cough more than normal, especially with physical activity.  You have any or more swelling in areas such as your hands, feet, ankles, or abdomen.  You are unable to sleep because it is hard to breathe.  You feel like your heart is beating fast (palpitations).  You become dizzy or light-headed upon standing up. SEEK IMMEDIATE MEDICAL CARE IF:   You have difficulty breathing.  There is a change in mental status such as decreased alertness or difficulty with concentration.  You have a pain or discomfort in your chest.  You have an episode of fainting (syncope). MAKE SURE YOU:   Understand these instructions.  Will watch your condition.  Will get help right away if you are not doing well or get worse.   This information is not intended to replace advice given to you by your health care provider. Make sure you discuss any questions you have with your health care provider.   Document Released: 10/26/2005 Document Revised: 03/12/2015 Document Reviewed: 11/25/2012 Elsevier Interactive Patient Education [employer]  Information on my medicine - Coumadin   (Warfarin)  This medication education was reviewed with me or my healthcare representative as part of my discharge preparation.    Why was Coumadin prescribed for  you? Coumadin was prescribed for you because you have a blood clot or a medical condition that can cause an increased risk of forming blood clots. Blood clots can cause serious health problems by blocking the flow of blood to the heart, lung, or brain. Coumadin can prevent harmful blood clots from forming. As a reminder your indication for Coumadin is:   Stroke Prevention Because Of Atrial Fibrillation  What test will check on my response to Coumadin? While on Coumadin (warfarin) you will need to have an INR test regularly to ensure that your dose is keeping you in the desired range. The INR (international normalized ratio) number is calculated from the result of the laboratory test called prothrombin time (PT).  If an INR APPOINTMENT HAS NOT ALREADY BEEN MADE FOR YOU please schedule an appointment to have this lab work done by your health care provider within 7 days. Your INR goal is usually a number between:  2 to  3 or your provider may give you a more narrow range like 2-2.5.  Ask your health care provider during an office visit what your goal INR is.  What  do you need to  know  About  COUMADIN? Take Coumadin (warfarin) exactly as prescribed by your healthcare provider about the same time each day.  DO NOT stop taking without talking to the doctor who prescribed the medication.  Stopping without other blood clot prevention medication to take the place of Coumadin may increase your risk of developing a new clot or stroke.  Get refills before you run out.  What do you do if you miss a dose? If you miss a dose, take it as soon as you remember on the same day then continue your regularly scheduled regimen the next day.  Do not take two doses of Coumadin at the same time.  Important Safety Information A possible side effect of Coumadin (Warfarin) is an increased risk of bleeding. You should call your healthcare provider right away if you experience any of the following: ? Bleeding from an injury or  your nose that does not stop. ? Unusual colored urine (red or dark brown) or unusual colored stools (red or black). ? Unusual bruising for unknown reasons. ? A serious fall or if you hit your head (even if there is no bleeding).  Some foods or medicines interact with Coumadin (warfarin) and might alter your response to warfarin. To help avoid this: ? Eat a balanced diet, maintaining a consistent amount of Vitamin K. ? Notify your provider about major diet changes you plan to make. ? Avoid alcohol or limit your intake to 1 drink for women and 2 drinks for men per day. (1 drink is 5 oz. wine, 12 oz. beer, or 1.5 oz. liquor.)  Make sure that ANY health care provider who prescribes medication for you knows that you are taking Coumadin (warfarin).  Also make sure the healthcare provider who is monitoring your Coumadin knows when you have started a new medication including herbals and non-prescription products.  Coumadin (Warfarin)  Major Drug Interactions  Increased Warfarin Effect Decreased Warfarin Effect  Alcohol (large quantities) Antibiotics (esp. Septra/Bactrim, Flagyl, Cipro) Amiodarone (Cordarone) Aspirin (ASA) Cimetidine (Tagamet) Megestrol (Megace) NSAIDs (ibuprofen, naproxen, etc.) Piroxicam (Feldene) Propafenone (Rythmol SR) Propranolol (Inderal) Isoniazid (INH) Posaconazole (Noxafil) Barbiturates (Phenobarbital) Carbamazepine (Tegretol) Chlordiazepoxide (Librium) Cholestyramine (Questran) Griseofulvin Oral Contraceptives Rifampin Sucralfate (Carafate) Vitamin K   Coumadin (Warfarin) Major Herbal Interactions  Increased Warfarin Effect Decreased Warfarin Effect  Garlic Ginseng Ginkgo biloba Coenzyme Q10 Green tea St. Johns wort    Coumadin (Warfarin) FOOD Interactions  Eat a consistent number of servings per week of foods HIGH in Vitamin K (1 serving =  cup)  Collards (cooked, or boiled & drained) Kale (cooked, or boiled & drained) Mustard greens (cooked,  or boiled & drained) Parsley *serving size only =  cup Spinach (cooked, or boiled & drained) Swiss chard (cooked, or boiled & drained) Turnip greens (cooked, or boiled & drained)  Eat a consistent number of servings per week of foods MEDIUM-HIGH in Vitamin K (1 serving = 1 cup)  Asparagus (cooked, or boiled & drained) Broccoli (cooked, boiled & drained, or raw & chopped) Brussel sprouts (cooked, or boiled & drained) *serving size only =  cup Lettuce, raw (green leaf, endive, romaine) Spinach, raw Turnip greens, raw & chopped   These websites have more information on Coumadin (warfarin):  http://[email or web address]-russell.[email or web address]; https://www.hines.net/;

## 2016-01-10 NOTE — Consult Note (Signed)
[facility] Face-to-Face Psychiatry Consult   Reason for Consult:  Capacity Evaluation Referring Physician:  Dr. Sunnie Nielsen Patient Identification: [name] MRN:  [id number] Principal Diagnosis: Acute on chronic systolic heart failure [facility]) Diagnosis:   Patient Active Problem List   Diagnosis Date Noted  . Acute on chronic heart failure (HCC) [I50.9]   . Anticoagulation adequate [Z79.01]   . 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 Acute on chronic systolic and diastolic heart failure, NYHA class 3 (HCC) [I50.43]   . Psychiatric diagnosis deferred [R69]   . Elevated troponin [R79.89]   . Acute on chronic systolic heart failure (HCC) [I50.23] 12/28/2015  . Atrial flutter (HCC) [I48.92] 12/28/2015  . Type 2 diabetes mellitus (HCC) [E11.9]   . Encounter for therapeutic drug monitoring [Z51.81] 05/24/2014  . HTN (hypertension) [I10] 05/21/2014  . Chronic systolic heart failure (HCC) [I50.22] 05/15/2014  . Obstructive sleep apnea [G47.33] 05/13/2014  . Acute venous stasis dermatitis of both legs [I83.11, I83.12] 05/13/2014  . Dyspnea [R06.00] 05/09/2014  . Atrial fibrillation with RVR (HCC) [I48.91] 05/09/2014  . AKI (acute kidney injury) (HCC) [N17.9] 05/09/2014  . Swelling of lower extremity [M79.89] 05/09/2014  . Depression [F32.9] 05/09/2014  . Type 2 diabetes, uncontrolled, with renal manifestation (HCC) [E11.29, E11.65] 05/09/2014  . 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 Obesity (BMI 30-39.9) [E66.9] 05/09/2014  . Ascites [R18.8] 05/09/2014    Total Time spent with patient: 1 hour  Subjective:   [name] is a 65 y.o. male patient admitted with depression and lower extremity edema.  HPI:  [name] is a 65 y.o. male seen, chart reviewed for face-to-face psychiatric consultation and evaluation of Capacity to make his own medical decisions and living arrangements. Patient appeared awake, alert, oriented to time place  person and situation. Patient has intact cognitions including orientation, memory immediate and delayed, concentration and language functions including abstract thinking. Patient stated he wants no more timing will be discharged to home and we have time to purchase his medication when given scripts at the time of discharge. Patient could not do straight ounces when asked about medication efficacy and side effects of Lexapro and Abilify but compliant with medication. Patient does not appear to be depressed, anxious or responding to internal stimuli. Patient does not appear to have any suicidal ideation, behavior so gestures.  Past Psychiatric History: He has history of depression since 2004 when he was separated from his wife and was admitted to [facility] several times prior to 2010  Risk to Self: Is patient at risk for suicide?: No Risk to Others:   Prior Inpatient Therapy:   Prior Outpatient Therapy:    Past Medical History:  Past Medical History  Diagnosis Date  . A-fib (HCC)     a. (06/04/14) TEE-DC-CV; succesful; large LA 6.2 cm  . Depression   . Type 2 diabetes mellitus (HCC)   . CKD (chronic kidney disease) stage 3, GFR 30-59 ml/min   . OSA (obstructive sleep apnea)   . Chronic systolic heart failure (HCC)     a. ECHO (05/2014): EF 25-30%, diff HK, RV midly dilated and sys fx mildly/mod reduced  . HTN (hypertension)     Past Surgical History  Procedure Laterality Date  . Vasectomy    . Nose surgery      Nasal septum surgery  . Tee without cardioversion N/A 06/04/2014    Procedure: TRANSESOPHAGEAL ECHOCARDIOGRAM (TEE);  Surgeon: Laurey Morale, MD;  Location:  [facility] ENDOSCOPY;  Service: Cardiovascular;  Laterality: N/A;  . Cardioversion N/A 06/04/2014    Procedure: CARDIOVERSION;  Surgeon: Laurey Morale, MD;  Location: [facility] ENDOSCOPY;  Service: Cardiovascular;  Laterality: N/A;  . Tee without cardioversion N/A 01/06/2016    Procedure: TRANSESOPHAGEAL ECHOCARDIOGRAM (TEE);  Surgeon: Pricilla Riffle, MD;  Location[facility] ENDOSCOPY;  Service: Cardiovascular;  Laterality: N/A;   Family History:  Family History  Problem Relation Age of Onset  . Heart attack Mother     deceased  . Diabetes Mother    Family Psychiatric  History: Denied Social History:  History  Alcohol Use No     History  Drug Use No    Social History   Social History  . Marital Status: Divorced    Spouse Name: N/A  . Number of Children: N/A  . Years of Education: N/A   Social History Main Topics  . Smoking status: Former Smoker    Types: Cigars  . Smokeless tobacco: None     Comment: Smoked when he was in his teens  . Alcohol Use: No  . Drug Use: No  . Sexual Activity: Not Asked   Other Topics Concern  . None   Social History Narrative   Lives in [place] by himself. Retired from [employer] for [employer].    Additional Social History:    Allergies:  No Known Allergies  Labs:  Results for orders placed or performed during the hospital encounter of 12/28/15 (from the past 48 hour(s))  Glucose, capillary     Status: Abnormal   Collection Time: 01/08/16 11:24 AM  Result Value Ref Range   Glucose-Capillary 106 (H) 65 - 99 mg/dL  Glucose, capillary     Status: Abnormal   Collection Time: 01/08/16  4:10 PM  Result Value Ref Range   Glucose-Capillary 146 (H) 65 - 99 mg/dL   Comment 1 Notify RN   Glucose, capillary     Status: None   Collection Time: 01/08/16  8:45 PM  Result Value Ref Range   Glucose-Capillary 93 65 - 99 mg/dL   Comment 1 Notify RN    Comment 2 Document in Chart   Protime-INR     Status: Abnormal   Collection Time: 01/09/16  4:52 AM  Result Value Ref Range   Prothrombin Time 29.3 (H) 11.6 - 15.2 seconds   INR 2.83 (H) 0.00 - 1.49  Glucose, capillary     Status: Abnormal   Collection Time: 01/09/16  7:04 AM  Result Value Ref Range   Glucose-Capillary 103 (H) 65 - 99 mg/dL  Glucose, capillary     Status: Abnormal   Collection Time: 01/09/16 11:34 AM   Result Value Ref Range   Glucose-Capillary 128 (H) 65 - 99 mg/dL  Glucose, capillary     Status: None   Collection Time: 01/09/16  4:27 PM  Result Value Ref Range   Glucose-Capillary 99 65 - 99 mg/dL  Glucose, capillary     Status: None   Collection Time: 01/09/16  9:25 PM  Result Value Ref Range   Glucose-Capillary 95 65 - 99 mg/dL  Protime-INR     Status: Abnormal   Collection Time: 01/10/16  3:45 AM  Result Value Ref Range   Prothrombin Time 28.5 (H) 11.6 - 15.2 seconds   INR 2.73 (H) 0.00 - 1.49  Glucose, capillary     Status: Abnormal   Collection Time: 01/10/16  6:26 AM  Result Value Ref Range   Glucose-Capillary  110 (H) 65 - 99 mg/dL    Current Facility-Administered Medications  Medication Dose Route Frequency Provider Last Rate Last Dose  . 0.9 %  sodium chloride infusion  250 mL Intravenous PRN Gwenyth Bender, NP 10 mL/hr at 01/04/16 1610    . 0.9 %  sodium chloride infusion  250 mL Intravenous Continuous Chilton Si, MD 1 mL/hr at 01/05/16 1100 250 mL at 01/05/16 1100  . acetaminophen (TYLENOL) tablet 650 mg  650 mg Oral Q4H PRN Lesle Chris Black, NP      . ARIPiprazole (ABILIFY) tablet 2 mg  2 mg Oral BID Leata Mouse, MD   2 mg at 01/08/16 0800  . digoxin (LANOXIN) tablet 0.125 mg  0.125 mg Oral Daily Lesle Chris Black, NP   0.125 mg at 01/09/16 9604  . escitalopram (LEXAPRO) tablet 10 mg  10 mg Oral Daily Leata Mouse, MD   10 mg at 01/05/16 1000  . furosemide (LASIX) tablet 80 mg  80 mg Oral BID Dwana Melena, PA-C   80 mg at 01/09/16 1755  . hydrocortisone cream 1 % 1 application  1 application Topical TID PRN Chilton Si, MD      . hydrOXYzine (ATARAX/VISTARIL) tablet 25 mg  25 mg Oral Q8H PRN Lesle Chris Black, NP      . insulin aspart (novoLOG) injection 0-15 Units  0-15 Units Subcutaneous TID WC Belkys A Regalado, MD   2 Units at 01/09/16 1159  . insulin aspart (novoLOG) injection 0-5 Units  0-5 Units Subcutaneous QHS Gwenyth Bender, NP   0  Units at 12/28/15 2200  . losartan (COZAAR) tablet 25 mg  25 mg Oral Daily Lars Masson, MD   25 mg at 01/09/16 0936  . metoprolol succinate (TOPROL-XL) 24 hr tablet 100 mg  100 mg Oral BID Azalee Course, PA   100 mg at 01/09/16 2334  . ondansetron (ZOFRAN) injection 4 mg  4 mg Intravenous Q6H PRN Lesle Chris Black, NP      . potassium chloride SA (K-DUR,KLOR-CON) CR tablet 40 mEq  40 mEq Oral Daily Ripudeep Jenna Luo, MD   40 mEq at 01/09/16 0936  . sodium chloride flush (NS) 0.9 % injection 3 mL  3 mL Intravenous Q12H Lesle Chris Black, NP   3 mL at 01/08/16 1000  . sodium chloride flush (NS) 0.9 % injection 3 mL  3 mL Intravenous PRN Lesle Chris Black, NP      . sodium chloride flush (NS) 0.9 % injection 3 mL  3 mL Intravenous Q12H Chilton Si, MD   3 mL at 01/08/16 2302  . sodium chloride flush (NS) 0.9 % injection 3 mL  3 mL Intravenous PRN Chilton Si, MD      . warfarin (COUMADIN) tablet 6 mg  6 mg Oral q1800 Scarlett Presto, RPH   6 mg at 01/09/16 1755  . Warfarin - Pharmacist Dosing Inpatient   Does not apply q1800 Ripudeep Jenna Luo, MD        Musculoskeletal: Strength & Muscle Tone: decreased Gait & Station: unable to stand Patient leans: N/A  Psychiatric Specialty Exam: ROS   Blood pressure 126/56, pulse 88, temperature 97.9 F (36.6 C), temperature source Oral, resp. rate 18, height 5\' 8"  (1.727 m), weight 85.866 kg (189 lb 4.8 oz), SpO2 97 %.Body mass index is 28.79 kg/(m^2).  General Appearance: Bizarre and Disheveled  Eye Contact::  Good  Speech:  Clear and Coherent  Volume:  Decreased  Mood:  Anxious  and Depressed  Affect:  Depressed and Inappropriate  Thought Process:  Disorganized, Irrelevant and Tangential  Orientation:  Full (Time, Place, and Person)  Thought Content:  Rumination  Suicidal Thoughts:  No  Homicidal Thoughts:  No  Memory:  Immediate;   Fair Recent;   Fair  Judgement:  Impaired  Insight:  Fair  Psychomotor Activity:  Decreased  Concentration:  Fair   Recall:  [employer] of Knowledge:Good  Language: Good  Akathisia:  Negative  Handed:  Right  AIMS (if indicated):     Assets:  Communication Skills Desire for Improvement Financial Resources/Insurance Housing Leisure Time Resilience Transportation  ADL's:  Impaired  Cognition: WNL  Sleep:      Treatment Plan Summary: Daily contact with patient to assess and evaluate symptoms and progress in treatment and Medication management   Patient meet criteria for capacity to make his own medical decisions and living arrangements as per my evaluation today   Continue Lexapro 10 mg daily for depression and anxiety and Abilify 2 mg twice daily boosting antidepressant  Monitor for the adverse effect of the medications Appreciate psychiatric consultation and we sign off as he does not need further evaluation or services Please contact [phone] or [phone] if needs further assistance  Disposition: Referred to the outpatient psychiatric services and medically stable and ready to be discharged and contact the social worker for appropriate referral sources  Patient does not meet criteria for psychiatric inpatient admission. Supportive therapy provided about ongoing stressors.  Nehemiah Settle., MD 01/10/2016 9:24 AM

## 2016-01-16 ENCOUNTER — Ambulatory Visit (INDEPENDENT_AMBULATORY_CARE_PROVIDER_SITE_OTHER): Payer: Medicare Other | Admitting: *Deleted

## 2016-01-16 DIAGNOSIS — I8290 Acute embolism and thrombosis of unspecified vein: Secondary | ICD-10-CM | POA: Diagnosis not present

## 2016-01-16 DIAGNOSIS — I4892 Unspecified atrial flutter: Secondary | ICD-10-CM

## 2016-01-16 DIAGNOSIS — I513 Intracardiac thrombosis, not elsewhere classified: Secondary | ICD-10-CM

## 2016-01-16 DIAGNOSIS — I4891 Unspecified atrial fibrillation: Secondary | ICD-10-CM | POA: Diagnosis not present

## 2016-01-16 DIAGNOSIS — Z5181 Encounter for therapeutic drug level monitoring: Secondary | ICD-10-CM

## 2016-01-16 DIAGNOSIS — I213 ST elevation (STEMI) myocardial infarction of unspecified site: Secondary | ICD-10-CM

## 2016-01-16 LAB — POCT INR: INR: 4.4

## 2016-01-16 NOTE — Patient Instructions (Signed)
A full discussion of the nature of anticoagulants has been carried out.  A benefit risk analysis has been presented to the patient, so that they understand the justification for choosing anticoagulation at this time. The need for frequent and regular monitoring, precise dosage adjustment and compliance is stressed.  Side effects of potential bleeding are discussed.  The patient should avoid any OTC items containing aspirin or ibuprofen, and should avoid great swings in general diet.  Avoid alcohol consumption.  

## 2016-01-21 ENCOUNTER — Inpatient Hospital Stay (HOSPITAL_COMMUNITY): Payer: Self-pay | Admitting: Internal Medicine

## 2016-01-23 ENCOUNTER — Inpatient Hospital Stay (HOSPITAL_COMMUNITY): Payer: Self-pay | Admitting: Internal Medicine

## 2016-03-14 ENCOUNTER — Inpatient Hospital Stay (HOSPITAL_COMMUNITY)
Admission: EM | Admit: 2016-03-14 | Discharge: 2016-03-23 | DRG: 308 | Disposition: A | Discharge status: Hospice | Payer: Medicare Other | Attending: Family Medicine | Admitting: Family Medicine

## 2016-03-14 ENCOUNTER — Emergency Department (HOSPITAL_COMMUNITY): Payer: Medicare Other

## 2016-03-14 ENCOUNTER — Encounter (HOSPITAL_COMMUNITY): Payer: Self-pay | Admitting: *Deleted

## 2016-03-14 DIAGNOSIS — Z6379 Other stressful life events affecting family and household: Secondary | ICD-10-CM | POA: Diagnosis not present

## 2016-03-14 DIAGNOSIS — Z833 Family history of diabetes mellitus: Secondary | ICD-10-CM | POA: Diagnosis not present

## 2016-03-14 DIAGNOSIS — N179 Acute kidney failure, unspecified: Secondary | ICD-10-CM

## 2016-03-14 DIAGNOSIS — Z6834 Body mass index (BMI) 34.0-34.9, adult: Secondary | ICD-10-CM | POA: Diagnosis not present

## 2016-03-14 DIAGNOSIS — T464X6A Underdosing of angiotensin-converting-enzyme inhibitors, initial encounter: Secondary | ICD-10-CM | POA: Diagnosis present

## 2016-03-14 DIAGNOSIS — I130 Hypertensive heart and chronic kidney disease with heart failure and stage 1 through stage 4 chronic kidney disease, or unspecified chronic kidney disease: Secondary | ICD-10-CM | POA: Diagnosis present

## 2016-03-14 DIAGNOSIS — R0602 Shortness of breath: Secondary | ICD-10-CM | POA: Diagnosis present

## 2016-03-14 DIAGNOSIS — Z79899 Other long term (current) drug therapy: Secondary | ICD-10-CM

## 2016-03-14 DIAGNOSIS — I248 Other forms of acute ischemic heart disease: Secondary | ICD-10-CM | POA: Diagnosis present

## 2016-03-14 DIAGNOSIS — I5023 Acute on chronic systolic (congestive) heart failure: Secondary | ICD-10-CM | POA: Diagnosis not present

## 2016-03-14 DIAGNOSIS — E1165 Type 2 diabetes mellitus with hyperglycemia: Secondary | ICD-10-CM | POA: Diagnosis present

## 2016-03-14 DIAGNOSIS — Z9114 Patient's other noncompliance with medication regimen: Secondary | ICD-10-CM

## 2016-03-14 DIAGNOSIS — T501X6A Underdosing of loop [high-ceiling] diuretics, initial encounter: Secondary | ICD-10-CM | POA: Diagnosis present

## 2016-03-14 DIAGNOSIS — R7989 Other specified abnormal findings of blood chemistry: Secondary | ICD-10-CM | POA: Diagnosis present

## 2016-03-14 DIAGNOSIS — N183 Chronic kidney disease, stage 3 unspecified: Secondary | ICD-10-CM | POA: Diagnosis present

## 2016-03-14 DIAGNOSIS — Z66 Do not resuscitate: Secondary | ICD-10-CM | POA: Diagnosis not present

## 2016-03-14 DIAGNOSIS — I10 Essential (primary) hypertension: Secondary | ICD-10-CM | POA: Diagnosis present

## 2016-03-14 DIAGNOSIS — Z87891 Personal history of nicotine dependence: Secondary | ICD-10-CM | POA: Diagnosis not present

## 2016-03-14 DIAGNOSIS — I4891 Unspecified atrial fibrillation: Secondary | ICD-10-CM | POA: Diagnosis not present

## 2016-03-14 DIAGNOSIS — Z8249 Family history of ischemic heart disease and other diseases of the circulatory system: Secondary | ICD-10-CM

## 2016-03-14 DIAGNOSIS — T462X6A Underdosing of other antidysrhythmic drugs, initial encounter: Secondary | ICD-10-CM | POA: Diagnosis present

## 2016-03-14 DIAGNOSIS — I5043 Acute on chronic combined systolic (congestive) and diastolic (congestive) heart failure: Secondary | ICD-10-CM | POA: Diagnosis present

## 2016-03-14 DIAGNOSIS — G4733 Obstructive sleep apnea (adult) (pediatric): Secondary | ICD-10-CM | POA: Diagnosis present

## 2016-03-14 DIAGNOSIS — I872 Venous insufficiency (chronic) (peripheral): Secondary | ICD-10-CM | POA: Diagnosis present

## 2016-03-14 DIAGNOSIS — F419 Anxiety disorder, unspecified: Secondary | ICD-10-CM | POA: Diagnosis present

## 2016-03-14 DIAGNOSIS — Z7901 Long term (current) use of anticoagulants: Secondary | ICD-10-CM

## 2016-03-14 DIAGNOSIS — F329 Major depressive disorder, single episode, unspecified: Secondary | ICD-10-CM | POA: Diagnosis not present

## 2016-03-14 DIAGNOSIS — F339 Major depressive disorder, recurrent, unspecified: Secondary | ICD-10-CM | POA: Diagnosis present

## 2016-03-14 DIAGNOSIS — R778 Other specified abnormalities of plasma proteins: Secondary | ICD-10-CM | POA: Diagnosis present

## 2016-03-14 DIAGNOSIS — E1122 Type 2 diabetes mellitus with diabetic chronic kidney disease: Secondary | ICD-10-CM | POA: Diagnosis present

## 2016-03-14 DIAGNOSIS — Z9119 Patient's noncompliance with other medical treatment and regimen: Secondary | ICD-10-CM | POA: Diagnosis not present

## 2016-03-14 DIAGNOSIS — E1129 Type 2 diabetes mellitus with other diabetic kidney complication: Secondary | ICD-10-CM | POA: Diagnosis present

## 2016-03-14 DIAGNOSIS — T45516A Underdosing of anticoagulants, initial encounter: Secondary | ICD-10-CM | POA: Diagnosis present

## 2016-03-14 DIAGNOSIS — F330 Major depressive disorder, recurrent, mild: Secondary | ICD-10-CM | POA: Diagnosis not present

## 2016-03-14 DIAGNOSIS — I513 Intracardiac thrombosis, not elsewhere classified: Secondary | ICD-10-CM | POA: Diagnosis present

## 2016-03-14 DIAGNOSIS — Z515 Encounter for palliative care: Secondary | ICD-10-CM

## 2016-03-14 DIAGNOSIS — M7989 Other specified soft tissue disorders: Secondary | ICD-10-CM | POA: Diagnosis present

## 2016-03-14 DIAGNOSIS — F32A Depression, unspecified: Secondary | ICD-10-CM | POA: Diagnosis present

## 2016-03-14 DIAGNOSIS — Z91148 Patient's other noncompliance with medication regimen for other reason: Secondary | ICD-10-CM

## 2016-03-14 LAB — PROTIME-INR
INR: 1.57 — ABNORMAL HIGH (ref 0.00–1.49)
Prothrombin Time: 18.8 seconds — ABNORMAL HIGH (ref 11.6–15.2)

## 2016-03-14 LAB — BASIC METABOLIC PANEL
Anion gap: 13 (ref 5–15)
BUN: 38 mg/dL — AB (ref 6–20)
CHLORIDE: 104 mmol/L (ref 101–111)
CO2: 18 mmol/L — AB (ref 22–32)
CREATININE: 2.01 mg/dL — AB (ref 0.61–1.24)
Calcium: 9 mg/dL (ref 8.9–10.3)
GFR calc Af Amer: 38 mL/min — ABNORMAL LOW (ref >60)
GFR calc non Af Amer: 33 mL/min — ABNORMAL LOW (ref >60)
GLUCOSE: 146 mg/dL — AB (ref 65–99)
Potassium: 4.5 mmol/L (ref 3.5–5.1)
SODIUM: 135 mmol/L (ref 135–145)

## 2016-03-14 LAB — CBC WITH DIFFERENTIAL/PLATELET
Basophils Absolute: 0 10*3/uL (ref 0.0–0.1)
Basophils Relative: 0 %
Eosinophils Absolute: 0 10*3/uL (ref 0.0–0.7)
Eosinophils Relative: 1 %
HEMATOCRIT: 41.5 % (ref 39.0–52.0)
HEMOGLOBIN: 12.7 g/dL — AB (ref 13.0–17.0)
LYMPHS ABS: 1.4 10*3/uL (ref 0.7–4.0)
LYMPHS PCT: 20 %
MCH: 27.4 pg (ref 26.0–34.0)
MCHC: 30.6 g/dL (ref 30.0–36.0)
MCV: 89.4 fL (ref 78.0–100.0)
MONOS PCT: 8 %
Monocytes Absolute: 0.5 10*3/uL (ref 0.1–1.0)
NEUTROS ABS: 5 10*3/uL (ref 1.7–7.7)
NEUTROS PCT: 71 %
Platelets: 191 10*3/uL (ref 150–400)
RBC: 4.64 MIL/uL (ref 4.22–5.81)
RDW: 18.5 % — ABNORMAL HIGH (ref 11.5–15.5)
WBC: 7 10*3/uL (ref 4.0–10.5)

## 2016-03-14 LAB — MRSA PCR SCREENING: MRSA by PCR: NEGATIVE

## 2016-03-14 LAB — I-STAT TROPONIN, ED: TROPONIN I, POC: 0.79 ng/mL — AB (ref 0.00–0.08)

## 2016-03-14 LAB — DIGOXIN LEVEL: Digoxin Level: <0.2 ng/mL — ABNORMAL LOW (ref 0.8–2.0)

## 2016-03-14 MED ORDER — DILTIAZEM LOAD VIA INFUSION
20.0000 mg | Freq: Once | INTRAVENOUS | Status: AC
  Administered 2016-03-14: 20 mg via INTRAVENOUS
  Filled 2016-03-14: qty 20

## 2016-03-14 MED ORDER — SODIUM CHLORIDE 0.9% FLUSH: 3.0000 mL | INTRAVENOUS | Status: DC | PRN

## 2016-03-14 MED ORDER — ONDANSETRON HCL 4 MG/2ML IJ SOLN
4.0000 mg | Freq: Four times a day (QID) | INTRAMUSCULAR | Status: DC | PRN
  Administered 2016-03-18: 4 mg via INTRAVENOUS
  Filled 2016-03-14: qty 2

## 2016-03-14 MED ORDER — SODIUM CHLORIDE 0.9% FLUSH
3.0000 mL | Freq: Two times a day (BID) | INTRAVENOUS | Status: DC
  Administered 2016-03-14 – 2016-03-23 (×17): 3 mL via INTRAVENOUS

## 2016-03-14 MED ORDER — ACETAMINOPHEN 325 MG PO TABS: 650.0000 mg | ORAL_TABLET | ORAL | Status: DC | PRN

## 2016-03-14 MED ORDER — ENOXAPARIN SODIUM 120 MG/0.8ML ~~LOC~~ SOLN
1.0000 mg/kg | Freq: Two times a day (BID) | SUBCUTANEOUS | Status: DC
  Administered 2016-03-14 – 2016-03-19 (×10): 110 mg via SUBCUTANEOUS
  Filled 2016-03-14: qty 0.8
  Filled 2016-03-14 (×2): qty 0.72
  Filled 2016-03-14 (×2): qty 0.8
  Filled 2016-03-14: qty 0.72
  Filled 2016-03-14 (×2): qty 0.8
  Filled 2016-03-14 (×3): qty 0.72

## 2016-03-14 MED ORDER — SODIUM CHLORIDE 0.9 % IV SOLN: 250.0000 mL | INTRAVENOUS | Status: DC | PRN

## 2016-03-14 MED ORDER — DILTIAZEM HCL 100 MG IV SOLR
5.0000 mg/h | INTRAVENOUS | Status: DC
  Administered 2016-03-14 – 2016-03-15 (×2): 7.5 mg/h via INTRAVENOUS
  Filled 2016-03-14: qty 100

## 2016-03-14 MED ORDER — DEXTROSE 5 % IV SOLN
5.0000 mg/h | INTRAVENOUS | Status: DC
  Administered 2016-03-14: 5 mg/h via INTRAVENOUS
  Filled 2016-03-14 (×2): qty 100

## 2016-03-14 NOTE — Progress Notes (Signed)
ANTICOAGULATION CONSULT NOTE - Initial Consult  Pharmacy Consult for heparin Indication: atrial fibrillation  No Known Allergies  Patient Measurements:    Vital Signs: Temp: 98.2 F (36.8 C) (05/06 1332) Temp Source: Oral (05/06 1332) BP: 126/99 mmHg (05/06 1645) Pulse Rate: 61 (05/06 1645)  Labs:  Recent Labs  03/14/16 1351 03/14/16 1439  HGB  --  12.7*  HCT  --  41.5  PLT  --  191  LABPROT  --  18.8*  INR  --  1.57*  CREATININE 2.01*  --     CrCl cannot be calculated (Unknown ideal weight.).   Medical History: Past Medical History  Diagnosis Date  . A-fib (HCC)     a. (06/04/14) TEE-DC-CV; succesful; large LA 6.2 cm  . Depression   . Type 2 diabetes mellitus (HCC)   . CKD (chronic kidney disease) stage 3, GFR 30-59 ml/min   . OSA (obstructive sleep apnea)   . Chronic systolic heart failure (HCC)     a. ECHO (05/2014): EF 25-30%, diff HK, RV midly dilated and sys fx mildly/mod reduced  . HTN (hypertension)     Assessment: 64 yom with SOB. PMH of afib on warfarin pta (noncompliant, ran out of meds). INR subtherapeutic. Pharmacy consulted to start lovenox. INR 1.57 on admit. Hg 12.7, plt wnl. No bleed documented. Weight 108kg, CrCl~43.  Goal of Therapy:  INR 2-3 Monitor platelets by anticoagulation protocol: Yes   Plan:  Lovenox 110mg  (~1mg /kg) [place] q12h Monitor CBC q72h, s/sx bleeding  Babs Bertin, PharmD, [facility] Clinical Pharmacist Pager [phone] 03/14/2016 5:18 PM

## 2016-03-14 NOTE — ED Notes (Signed)
Pt. Sitting up at the bed.  He ate a [place] sandwich and a drink,.

## 2016-03-14 NOTE — ED Provider Notes (Signed)
CSN: [id number]     Arrival date & time 03/14/16  1317 History   First MD Initiated Contact with Patient 03/14/16 1349     Chief Complaint  Patient presents with  . Shortness of Breath    Patient is a 65 y.o. male presenting with shortness of breath. The history is provided by the patient.  Shortness of Breath Severity:  Moderate Onset quality:  Gradual Duration:  1 month Timing:  Intermittent Progression:  Worsening Chronicity:  New Relieved by:  Nothing Worsened by:  Nothing tried Associated symptoms: cough   Associated symptoms: no chest pain, no fever and no vomiting   Patient with h/o atrial fibrillation on coumdain, h/o depression presents with increasing SOB for past month and also increased fluid retention He reports weight gain He reports cough No chest pain No fever No vomiting He reports h/o atrial fibrillation but has not meds recently as he ran out  Past Medical History  Diagnosis Date  . A-fib (HCC)     a. (06/04/14) TEE-DC-CV; succesful; large LA 6.2 cm  . Depression   . Type 2 diabetes mellitus (HCC)   . CKD (chronic kidney disease) stage 3, GFR 30-59 ml/min   . OSA (obstructive sleep apnea)   . Chronic systolic heart failure (HCC)     a. ECHO (05/2014): EF 25-30%, diff HK, RV midly dilated and sys fx mildly/mod reduced  . HTN (hypertension)    Past Surgical History  Procedure Laterality Date  . Vasectomy    . Nose surgery      Nasal septum surgery  . Tee without cardioversion N/A 06/04/2014    Procedure: TRANSESOPHAGEAL ECHOCARDIOGRAM (TEE);  Surgeon: Laurey Morale, MD;  Location: [facility] ENDOSCOPY;  Service: Cardiovascular;  Laterality: N/A;  . Cardioversion N/A 06/04/2014    Procedure: CARDIOVERSION;  Surgeon: Laurey Morale, MD;  Location: [facility] ENDOSCOPY;  Service: Cardiovascular;  Laterality: N/A;  . Tee without cardioversion N/A 01/06/2016    Procedure: TRANSESOPHAGEAL ECHOCARDIOGRAM (TEE);  Surgeon: Pricilla Riffle, MD;  Location: [facility] ENDOSCOPY;  Service:  Cardiovascular;  Laterality: N/A;   Family History  Problem Relation Age of Onset  . Heart attack Mother     deceased  . Diabetes Mother    Social History  Substance Use Topics  . Smoking status: Former Smoker    Types: Cigars  . Smokeless tobacco: None     Comment: Smoked when he was in his teens  . Alcohol Use: No    Review of Systems  Constitutional: Positive for unexpected weight change. Negative for fever.       Weight gain   Respiratory: Positive for cough and shortness of breath.   Cardiovascular: Positive for leg swelling. Negative for chest pain.  Gastrointestinal: Negative for vomiting.  All other systems reviewed and are negative.     Allergies  Review of patient's allergies indicates no known allergies.  Home Medications   Prior to Admission medications   Medication Sig Start Date End Date Taking? Authorizing Provider  digoxin (LANOXIN) 0.125 MG tablet Take 1 tablet (0.125 mg total) by mouth daily. Patient not taking: Reported on 03/14/2016 01/10/16   Belkys A Regalado, MD  furosemide (LASIX) 80 MG tablet Take 1 tablet (80 mg total) by mouth 2 (two) times daily. Patient not taking: Reported on 03/14/2016 01/10/16   Belkys A Regalado, MD  losartan (COZAAR) 25 MG tablet Take 1 tablet (25 mg total) by mouth daily. Patient not taking: Reported on 03/14/2016 01/10/16   Belkys A  Regalado, MD  metoprolol succinate (TOPROL-XL) 100 MG 24 hr tablet Take 1 tablet (100 mg total) by mouth 2 (two) times daily. Take with or immediately following a meal. Patient not taking: Reported on 03/14/2016 01/10/16   Belkys A Regalado, MD  warfarin (COUMADIN) 6 MG tablet Take 1 tablet (6 mg total) by mouth daily. Patient not taking: Reported on 03/14/2016 01/10/16   Belkys A Regalado, MD   BP 148/102 mmHg  Pulse 140  Temp(Src) 98.2 F (36.8 C) (Oral)  Resp 24  SpO2 100% Physical Exam CONSTITUTIONAL: Disheveled HEAD: Normocephalic/atraumatic EYES: EOMI ENMT: Mucous membranes moist NECK: supple  no meningeal signs SPINE/BACK:entire spine nontender CV: S1/S2 noted, no murmurs/rubs/gallops noted LUNGS: Lungs are clear to auscultation bilaterally, no apparent distress ABDOMEN: soft, nontender, abdominal distention noted GU:no cva tenderness NEURO: Pt is awake/alert/appropriate, moves all extremitiesx4.   EXTREMITIES: pulses normal/equal, full ROM, 3+ pitting edema (symmetric) bilateral Lower extremities SKIN: warm, color normal PSYCH: flat affect  ED Course  Procedures  CRITICAL CARE Performed by: Joya Gaskins Total critical care time: 33 minutes Critical care time was exclusive of separately billable procedures and treating other patients. Critical care was necessary to treat or prevent imminent or life-threatening deterioration. Critical care was time spent personally by me on the following activities: development of treatment plan with patient and/or surrogate as well as nursing, discussions with consultants, evaluation of patient's response to treatment, examination of patient, obtaining history from patient or surrogate, ordering and performing treatments and interventions, ordering and review of laboratory studies, ordering and review of radiographic studies, pulse oximetry and re-evaluation of patient's condition. PATIENT WITH ATRIAL FIBRILLATION WITH RVR , HEART RATE >150 REQUIRING IV CARDIZEM  3:30 PM Pt with h/o atrial fibrillation Apparently he is noncomplaint His INR is subtherapeutic His digoxin level is low He reports he ran out of meds Currently he is in atrial fibrillation He has h/o significant CHF He denies active CP - troponin elevated but likely due to demand ischemia from afib with RVR  On a side note - pt with flat affect and during conversation begins speaking in tangents about a variety of issues including aspartame comes from [place] and associated with fentanyl as well as he began to discuss horse racing (today is [employer] day) Per records, he  has h/o mental illness and previous admission to [facility] I suspect that this is stemming from his mental health issues  Will need psych consult as inpatient 3:53 PM D/w dr Julien Nordmann for admission He has been given IV cardizem He is still tachycardic We will place patient on stepdown unit At this time, I doubt ACS/PE at this time He will need rate control and will need to be placed back on his medications   Labs Review Labs Reviewed  BASIC METABOLIC PANEL - Abnormal; Notable for the following:    CO2 18 (*)    Glucose, Bld 146 (*)    BUN 38 (*)    Creatinine, Ser 2.01 (*)    GFR calc non Af Amer 33 (*)    GFR calc Af Amer 38 (*)    All other components within normal limits  CBC WITH DIFFERENTIAL/PLATELET - Abnormal; Notable for the following:    Hemoglobin 12.7 (*)    RDW 18.5 (*)    All other components within normal limits  DIGOXIN LEVEL - Abnormal; Notable for the following:    Digoxin Level <0.2 (*)    All other components within normal limits  PROTIME-INR - Abnormal; Notable  for the following:    Prothrombin Time 18.8 (*)    INR 1.57 (*)    All other components within normal limits  I-STAT TROPOININ, ED - Abnormal; Notable for the following:    Troponin i, poc 0.79 (*)    All other components within normal limits    Imaging Review Dg Chest 2 View  03/14/2016  CLINICAL DATA:  Chronic shortness of breath. EXAM: CHEST  2 VIEW COMPARISON:  12/28/2015 FINDINGS: Both lungs are clear. Heart and mediastinum are within normal limits. No pleural effusions. Degenerative changes in the thoracic spine. IMPRESSION: No acute cardiopulmonary disease. Electronically Signed   By: Richarda Overlie M.D.   On: 03/14/2016 14:06   I have personally reviewed and evaluated these images and lab results as part of my medical decision-making.   EKG Interpretation   Date/Time:  Saturday Mar 14 2016 13:30:42 EDT Ventricular Rate:  155 PR Interval:    QRS Duration: 100 QT Interval:  318 QTC  Calculation: 510 R Axis:   130 Text Interpretation:  Atrial fibrillation with rapid ventricular response  Right axis deviation Incomplete right bundle branch block Nonspecific T  wave abnormality Abnormal ECG Confirmed by Bebe Shaggy  MD, Dorinda Hill ([id number]) on  03/14/2016 1:34:51 PM Also confirmed by Bebe Shaggy  MD, Liannah Yarbough ([id number]), editor  Stout CT, Jola Babinski [phone])  on 03/14/2016 2:15:48 PM     Medications  diltiazem (CARDIZEM) 1 mg/mL load via infusion 20 mg (20 mg Intravenous Given 03/14/16 1548)    And  diltiazem (CARDIZEM) 100 mg in dextrose 5 % 100 mL (1 mg/mL) infusion (5 mg/hr Intravenous New Bag/Given 03/14/16 1548)    MDM   Final diagnoses:  Atrial fibrillation with rapid ventricular response (HCC)  AKI (acute kidney injury) [facility])    Nursing notes including past medical history and social history reviewed and considered in documentation xrays/imaging reviewed by myself and considered during evaluation Labs/vital reviewed myself and considered during evaluation Previous records reviewed and considered     Zadie Rhine, MD 03/14/16 1555

## 2016-03-14 NOTE — H&P (Signed)
[facility] History and Physical  ALEKS LILJA JSH:[id number] DOB: [birth year] DOA: 03/14/2016  Referring physician: ED PCP: [name], MD   - has not seen in long time, physician has since moved out of town.  Chief Complaint: le swelling  HPI: [name] is a 65 y.o. male  with most of the history that includes type 2 diabetes, uncontrolled, chronic kidney disease stage III, morbid obesity, chronic lower extremity edema, hypertension, chronic systolic heart failure, obstructive sleep apnea, atrial fibrillation, noncompliant with his Coumadin as well as his other anti-arrhythmics presents to the emergency room with worsening lower extremity edema for the last month.  States he can lay flat but feels like his abdominal bloating makes him very uncomfortable so he has to sit up straight or denies any chest pain or palpitations.    Pt states lives in home alone, eating ok, no abd complaints except abdominal bloating.   He has significant recent hospitalization in February 2017  with atrial fibrillation RVR had transesophageal echocardiogram on 2/27 down to have a LA thrombus and DD CV cardioversion was canceled.  His echocardiogram on 227 showed ejection fraction 2025% with diffuse hypokinesis.  History is difficult to obtain, numerous tangental thought processes.     Review of Systems:  Per HPI, otherwise, all systems reviewed and negative.    Past Medical History  Diagnosis Date  . A-fib (HCC)     a. (06/04/14) TEE-DC-CV; succesful; large LA 6.2 cm  . Depression   . Type 2 diabetes mellitus (HCC)   . CKD (chronic kidney disease) stage 3, GFR 30-59 ml/min   . OSA (obstructive sleep apnea)   . Chronic systolic heart failure (HCC)     a. ECHO (05/2014): EF 25-30%, diff HK, RV midly dilated and sys fx mildly/mod reduced  . HTN (hypertension)    Past Surgical History  Procedure Laterality Date  . Vasectomy    . Nose surgery      Nasal septum surgery  . Tee without  cardioversion N/A 06/04/2014    Procedure: TRANSESOPHAGEAL ECHOCARDIOGRAM (TEE);  Surgeon: Laurey Morale, MD;  Location: [facility] ENDOSCOPY;  Service: Cardiovascular;  Laterality: N/A;  . Cardioversion N/A 06/04/2014    Procedure: CARDIOVERSION;  Surgeon: Laurey Morale, MD;  Location: [facility] ENDOSCOPY;  Service: Cardiovascular;  Laterality: N/A;  . Tee without cardioversion N/A 01/06/2016    Procedure: TRANSESOPHAGEAL ECHOCARDIOGRAM (TEE);  Surgeon: Pricilla Riffle, MD;  Location: [facility] ENDOSCOPY;  Service: Cardiovascular;  Laterality: N/A;   Social History:  reports that he has quit smoking. His smoking use included Cigars. He does not have any smokeless tobacco history on file. He reports that he does not drink alcohol or use illicit drugs.  No Known Allergies  Family History  Problem Relation Age of Onset  . Heart attack Mother     deceased  . Diabetes Mother      Prior to Admission medications   Medication Sig Start Date End Date Taking? Authorizing Provider  digoxin (LANOXIN) 0.125 MG tablet Take 1 tablet (0.125 mg total) by mouth daily. Patient not taking: Reported on 03/14/2016 01/10/16   Belkys A Regalado, MD  furosemide (LASIX) 80 MG tablet Take 1 tablet (80 mg total) by mouth 2 (two) times daily. Patient not taking: Reported on 03/14/2016 01/10/16   Belkys A Regalado, MD  losartan (COZAAR) 25 MG tablet Take 1 tablet (25 mg total) by mouth daily. Patient not taking: Reported on 03/14/2016 01/10/16   Alba Cory, MD  metoprolol succinate (TOPROL-XL) 100 MG 24 hr tablet Take 1 tablet (100 mg total) by mouth 2 (two) times daily. Take with or immediately following a meal. Patient not taking: Reported on 03/14/2016 01/10/16   Belkys A Regalado, MD  warfarin (COUMADIN) 6 MG tablet Take 1 tablet (6 mg total) by mouth daily. Patient not taking: Reported on 03/14/2016 01/10/16   Alba Cory, MD   Physical Exam: Filed Vitals:   03/14/16 1630 03/14/16 1645 03/14/16 1706 03/14/16 1715  BP: 125/99 126/99      Pulse: 121 61    Temp:      TempSrc:      Resp: 13 12    Weight:    108.041 kg (238 lb 3 oz)  SpO2: 98% 96% 96%     Wt Readings from Last 3 Encounters:  03/14/16 108.041 kg (238 lb 3 oz)  01/10/16 85.866 kg (189 lb 4.8 oz)  06/19/14 96.333 kg (212 lb 6 oz)    General:  Appears calm and comfortable, pleasant, NAD, AAOx2 (month, knew the president, thought today was 5/7 - [place] day), speaks fluently,  Eyes: PERRL, normal lids, irises & conjunctiva ENT: grossly normal hearing, lips & tongue, mmm Neck: no LAD Cardiovascular:  irreg irreg, 3+ le edema upt to midcalve. Telemetry: afib rvr rate 140-150s on tele. Respiratory: diminished bilaterally, no w/r/r. Normal respiratory effort. Abdomen: soft, ntnd, no g/r, no palpable masses. Skin: no rash or induration seen on limited exam Musculoskeletal: grossly normal tone BUE/BLE Psychiatric: grossly normal mood and affect, speech fluent and appropriate; tangental thought processes noted.  Neurologic: grossly non-focal.          Labs on Admission:  Basic Metabolic Panel:  Recent Labs Lab 03/14/16 1351  NA 135  K 4.5  CL 104  CO2 18*  GLUCOSE 146*  BUN 38*  CREATININE 2.01*  CALCIUM 9.0   Liver Function Tests: No results for input(s): AST, ALT, ALKPHOS, BILITOT, PROT, ALBUMIN in the last 168 hours. No results for input(s): LIPASE, AMYLASE in the last 168 hours. No results for input(s): AMMONIA in the last 168 hours. CBC:  Recent Labs Lab 03/14/16 1439  WBC 7.0  NEUTROABS 5.0  HGB 12.7*  HCT 41.5  MCV 89.4  PLT 191   Cardiac Enzymes: No results for input(s): CKTOTAL, CKMB, CKMBINDEX, TROPONINI in the last 168 hours.  BNP (last 3 results)  Recent Labs  12/28/15 1449  BNP 886.8*    ProBNP (last 3 results) No results for input(s): PROBNP in the last 8760 hours.  CBG: No results for input(s): GLUCAP in the last 168 hours.  Radiological Exams on Admission: Dg Chest 2 View  03/14/2016  CLINICAL  DATA:  Chronic shortness of breath. EXAM: CHEST  2 VIEW COMPARISON:  12/28/2015 FINDINGS: Both lungs are clear. Heart and mediastinum are within normal limits. No pleural effusions. Degenerative changes in the thoracic spine. IMPRESSION: No acute cardiopulmonary disease. Electronically Signed   By: Richarda Overlie M.D.   On: 03/14/2016 14:06    EKG:  14-Mar-2016 13:30:42 afib rvr rate 155bpm, rad, no acute st changes.  Independently reviewed by me.   Echo 12/30/15 LV EF: 20% - 25%  ------------------------------------------------------------------- Indications: CHF - 428.0.  ------------------------------------------------------------------- Study Conclusions  - Left ventricle: The cavity size was severely dilated. Systolic  function was severely reduced. The estimated ejection fraction  was in the range of 20% to 25%. Severe diffuse hypokinesis. There  is akinesis of the basal-mid anteroseptal and apical anterior  myocardium. There is akinesis of the basalinferior myocardium.  There is akinesis of the entireanterior myocardium. The tissue  Doppler parameters were abnormal. The study is not technically  sufficient to allow evaluation of LV diastolic function. - Aortic valve: Moderate thickening and calcification, consistent  with sclerosis. - Mitral valve: There was mild regurgitation. - Left atrium: The atrium was severely dilated. - Right ventricle: Systolic function was moderately to severely  reduced. - Tricuspid valve: There was mild regurgitation. - Pulmonary arteries: PA peak pressure: 51 mm Hg (S).  Impressions:  - The right ventricular systolic pressure was increased consistent  with moderate pulmonary hypertension.  01/06/16 TEE Study Conclusions  - Left ventricle: LV is dilated LVEF is severely depressed at  approximately 10 to 15% - Left atrium: LA is large Spontaneous contrast swirling in LA. LA  appendage with echodensity consistent with  thrombus. Did not  clear.   Assessment/Plan Principal Problem:   Atrial fibrillation with rapid ventricular response (HCC) Active Problems:   Acute on chronic systolic heart failure (HCC)   Swelling of lower extremity   CKD (chronic kidney disease), stage II   Atrial fibrillation (HCC)   Noncompliance with medication regimen   1. Atrial fib rvr: CHAD2s- Vasc score 4 (htn, dm, age, chf); he appears in no acute distress, able to speak in full sentences.  He has been off all his medications at least 1 month, his digoxin level was  Given his history of noncompliance and history of LA thrombus in the past (TEE 01/06/16), cardioversion was considered. Patient was given a bolus of Cardizem 20 mg IV 1 in the ED and started on a Cardizem drip at 5 -15 mg /  Hour, titrate for heart rate.  He was started on Lovenox therapeutic dose, subcutaneous twice a day for now.   Consider converting over to Coumadin, but given his history of noncompliance it is a concern.  - cardiology was not consulted, but if decompensates, will do so.    2. Acute on chronic chf: echo 12/30/15 ef 20-25%, does not appear that symptomatic except for the lower extremity edema and bloating. Chest x-ray was clear.  We'll hold Lasix for now until off the Cardizem drip for concerns of hypo-tension.  We'll repeat echocardiogram to evaluate cardiac function  currently.  Follow troponins serially  3. Chronic le edema - holding lasix for now given cardizem gtt, concern for hypotension.  4. Medication non-compliance - off all meds x 1 month.  Will place s/w consult to assist w/ dispo.   5. Hx of dm, but sugars have been ranging 110-146s last few months, will chk a1c. No insulin s/s for now.  6. Psychiatric: pt w/ hx of depression/psychosocial issues, denies si currently, but I suspect that behavoriol/psychologic issues limit medical care? He had significant tangental thought processes, moved from 1 subject to next fluidly, not certain if  this is personality d/o or other, but this may be hindering his care.  He was seen by psychiatry 01/10/16 and they recd outpt psychiatric services at that time.   Code Status: FULL  VT Prophylaxis: lovenox 1mg /kg bid Family Communication: patient  Dispo Plan: at least 2 nights  Time spent: LaUpm[facility] Surgery CenteAloCho[facility] hiLaHunterdon Medical CenteAloN[place] piLaSt. Alb[facility] Community [facility] CenteAloNew [facility] PLaBaylor Scott [facility] - Lake PointAloSurgery Center Of Fre t LaSouthern Indiana Rehabilitation HospitaAloNatural Eyes Laser And Surgery Cen LLaSouthern Alabama Surgery Center LLAloChildren'S Mercy Southlita Cramseland MD., MBA/MHA [facility] Pager [phone]

## 2016-03-14 NOTE — ED Notes (Signed)
Dinner tray ordered.

## 2016-03-14 NOTE — ED Notes (Signed)
Pt reports having cough and sob, hx of chf. spo 98%. ekg done, HR 140.

## 2016-03-14 NOTE — ED Notes (Signed)
md notified of results

## 2016-03-15 ENCOUNTER — Inpatient Hospital Stay (HOSPITAL_COMMUNITY): Payer: Medicare Other

## 2016-03-15 DIAGNOSIS — I4891 Unspecified atrial fibrillation: Secondary | ICD-10-CM

## 2016-03-15 DIAGNOSIS — I5023 Acute on chronic systolic (congestive) heart failure: Secondary | ICD-10-CM

## 2016-03-15 DIAGNOSIS — N183 Chronic kidney disease, stage 3 (moderate): Secondary | ICD-10-CM

## 2016-03-15 LAB — ECHOCARDIOGRAM COMPLETE
Height: 68 in
WEIGHTICAEL: 3806.02 [oz_av]

## 2016-03-15 LAB — CBC
HCT: 39.7 % (ref 39.0–52.0)
HEMOGLOBIN: 12.2 g/dL — AB (ref 13.0–17.0)
MCH: 27.3 pg (ref 26.0–34.0)
MCHC: 30.7 g/dL (ref 30.0–36.0)
MCV: 88.8 fL (ref 78.0–100.0)
PLATELETS: 168 10*3/uL (ref 150–400)
RBC: 4.47 MIL/uL (ref 4.22–5.81)
RDW: 18.5 % — ABNORMAL HIGH (ref 11.5–15.5)
WBC: 7.2 10*3/uL (ref 4.0–10.5)

## 2016-03-15 LAB — BASIC METABOLIC PANEL
Anion gap: 11 (ref 5–15)
BUN: 37 mg/dL — AB (ref 6–20)
CO2: 19 mmol/L — ABNORMAL LOW (ref 22–32)
CREATININE: 1.73 mg/dL — AB (ref 0.61–1.24)
Calcium: 8.5 mg/dL — ABNORMAL LOW (ref 8.9–10.3)
Chloride: 104 mmol/L (ref 101–111)
GFR calc Af Amer: 46 mL/min — ABNORMAL LOW (ref >60)
GFR, EST NON AFRICAN AMERICAN: 40 mL/min — AB (ref >60)
GLUCOSE: 124 mg/dL — AB (ref 65–99)
POTASSIUM: 4 mmol/L (ref 3.5–5.1)
Sodium: 134 mmol/L — ABNORMAL LOW (ref 135–145)

## 2016-03-15 LAB — LIPID PANEL
CHOL/HDL RATIO: 6.6 ratio
Cholesterol: 106 mg/dL (ref 0–200)
HDL: 16 mg/dL — ABNORMAL LOW (ref >40)
LDL Cholesterol: 75 mg/dL (ref 0–99)
Triglycerides: 77 mg/dL (ref <150)
VLDL: 15 mg/dL (ref 0–40)

## 2016-03-15 LAB — TROPONIN I
TROPONIN I: 0.42 ng/mL — AB (ref <0.031)
TROPONIN I: 0.55 ng/mL — AB (ref <0.031)

## 2016-03-15 LAB — TSH: TSH: 3.565 u[IU]/mL (ref 0.350–4.500)

## 2016-03-15 MED ORDER — METOPROLOL TARTRATE 25 MG PO TABS
25.0000 mg | ORAL_TABLET | Freq: Four times a day (QID) | ORAL | Status: DC
  Administered 2016-03-15 – 2016-03-23 (×32): 25 mg via ORAL
  Filled 2016-03-15 (×32): qty 1

## 2016-03-15 MED ORDER — METOPROLOL TARTRATE 12.5 MG HALF TABLET
12.5000 mg | ORAL_TABLET | Freq: Four times a day (QID) | ORAL | Status: DC
  Administered 2016-03-15 (×2): 12.5 mg via ORAL
  Filled 2016-03-15 (×2): qty 1

## 2016-03-15 MED ORDER — DIGOXIN 125 MCG PO TABS
0.1250 mg | ORAL_TABLET | Freq: Every day | ORAL | Status: DC
  Administered 2016-03-15 – 2016-03-23 (×9): 0.125 mg via ORAL
  Filled 2016-03-15 (×9): qty 1

## 2016-03-15 MED ORDER — ATORVASTATIN CALCIUM 40 MG PO TABS
40.0000 mg | ORAL_TABLET | Freq: Every day | ORAL | Status: DC
  Administered 2016-03-15 – 2016-03-22 (×4): 40 mg via ORAL
  Filled 2016-03-15 (×7): qty 1

## 2016-03-15 MED ORDER — METOPROLOL TARTRATE 50 MG PO TABS: 100.0000 mg | ORAL_TABLET | Freq: Two times a day (BID) | ORAL | Status: DC

## 2016-03-15 NOTE — Progress Notes (Signed)
dilt off.  BP stable.  HR still in low 100s.  Increase metoprolol.  If blood pressure stable on increased metoprolol, will start some diuretic.

## 2016-03-15 NOTE — Progress Notes (Signed)
Echo read this afternoon, primary new findings is a large apical thrombus. Chart reviewed, patient already on appropriate therapy with full dose lovenox for his history of afib with plans to transition to coumadin, from records he has been noncompliant with anticoagulation and admitted with INR 1.57. No additional therapy is indicated for his apical thrombus. Can place consult for other cardiac needs if feel indicated.    Dominga Ferry MD

## 2016-03-15 NOTE — Progress Notes (Signed)
CRITICAL VALUE ALERT  Critical value received:  Troponin 0.55  Date of notification: 03/15/2016  Time of notification:  0640  Critical value read back:Yes.    Nurse who received alert:  Karen Chafe   MD notified (1st page):  M. Lynch  Time of first page:  [phone]  MD notified (2nd page):  Time of second page:  Responding MD:  M. Burnadette Peter  Time MD responded: [phone]

## 2016-03-15 NOTE — Progress Notes (Signed)
  Echocardiogram 2D Echocardiogram has been performed.  Arvil Chaco 03/15/2016, 2:33 PM

## 2016-03-15 NOTE — Clinical Social Work Note (Signed)
Clinical Social Worker received referral for medication assistance and new PCP.  Chart reviewed. Spoke with RN Case Manager who will follow up with patient to discuss medications and PCP.    CSW signing off - please re consult if social work needs arise.  Macario Golds, [place] [phone]

## 2016-03-15 NOTE — Progress Notes (Signed)
PROGRESS NOTE  [name]  ZOX:[id number] DOB: 1950/11/30 DOA: 03/14/2016 PCP: [name], MD Outpatient Specialists:  Dr. Tresa Endo, Cardiology From home alone  Brief Narrative:   [name] is a 65 y.o. male with history of uncontrolled type 2 diabetes, chronic kidney disease stage III, morbid obesity, chronic lower extremity edema, hypertension, chronic systolic heart failure EF 20-25%, obstructive sleep apnea, atrial fibrillation, noncompliant with his coumadin as well as his other anti-arrhythmics presented to the emergency room with worsening lower extremity edema and abdominal bloating.  He denied chest pain or palpitations.  He was hospitalized in February 2017 with atrial fibrillation with RVR and had transesophageal echocardiogram on 2/27.  He was found to have a LA thrombus.  His DCCV cardioversion was canceled.  History was difficult to obtain, numerous tangental thought processes.    Assessment & Plan:   Principal Problem:   Atrial fibrillation with rapid ventricular response (HCC) Active Problems:   Swelling of lower extremity   CKD (chronic kidney disease), stage III   Acute on chronic systolic heart failure (HCC)   Atrial fibrillation (HCC)   Noncompliance with medication regimen   Atrial fibrillation with RVR, CHAD2s- Vasc score 4 (htn, dm, age, chf).  Diltiazem relatively contraindicated in the setting of systolic heart failure. We will try to taper off infusion as quickly as possible. -  Start metoprolol 12.5 mg every 6 hours, hold for systolic blood pressure less than 110 -  Resume digoxin.  Is still tachycardic, will load with digoxin -  Continue Lovenox therapeutic dose   Acute on chronic systolic congestive heart failure, biventricular failure, echo 12/30/15 ef 20-25%, currently 15-20% with blunted IVC.  Chest x-ray was clear.  -  Resume BB as above -  Hold ACEI due to CKD and possibility of hypotension -  Once rate controlled, will try to  diurese  -  Daily weights -  Strict I/O  1.9 x 2.1cm apical thrombus that is partially mobile -  Anticoagulation with lovenox for now -  Monitor for strokes  Elevated troponins, chest pain free in setting of CKD, a-fib with RVR, and heart failure.  Possible CAD. -  Due to medication noncompliance and CKD, not a good candidate for heart catheterization -  Continue aspirin, BB, and high dose statin  Depression/psychosocial issues/noncompliance - off all meds x 1 month.  States he has anxiety that prevents him from leaving his house and taking his medications.  Reiterated that he is a "65 year old stuck in a 65 year old body."   -  Psychiatry consultation -  Resume lexapro and abilify -  Patient assessed during recent hospitalization and felt to have capacity -  SW and CM.  Unlikely that he could afford ALF.  Needs maximum home health services  Diabetes mellitus, type II - Hemoglobin A1c 6 -  Start SSI  Acute on CKD stage 3, likely due to cardiorenal syndrome -  Diuresis as blood pressure tolerates  Essential hypertension -Continue Lasix, metoprolol, losartan -Patient has been noncompliant, was supposed to be on Coreg, Lasix, lisinopril however has not taken these medications in over a year  DVT prophylaxis:  Therapeutic lovenox Code Status:  Full code Family Communication:  Patient alone, discussed test results and plan, questions answered Disposition Plan:  Rate controlled on oral medications.  High risk for readmission due to noncompliance.  Palliative care consult to clarify patient's goals of care and will arrange for home health services.  Psychiatry consult  Consultants:   Psychiatry  Procedures:  ECHO  Antimicrobials:   none    Subjective: Patient states that he feels stressed out.  Denies chest pain.  Shortness of breath is about the same as when he was admitted.  Legs and belly still feel swollen.    Objective: Filed Vitals:   03/15/16 0300 03/15/16 0400  03/15/16 0750 03/15/16 1153  BP: 120/86 100/83 122/87 119/76  Pulse: 50 84 95 65  Temp:  97.3 F (36.3 C) 97.7 F (36.5 C) 97.5 F (36.4 C)  TempSrc:  Oral Oral Oral  Resp:   16 16  Height:      Weight:  107.9 kg (237 lb 14 oz)    SpO2:  94% 96% 95%    Intake/Output Summary (Last 24 hours) at 03/15/16 1539 Last data filed at 03/15/16 1000  Gross per 24 hour  Intake  547.5 ml  Output    425 ml  Net  122.5 ml   Filed Weights   03/14/16 1715 03/14/16 2100 03/15/16 0400  Weight: 108.041 kg (238 lb 3 oz) 107.9 kg (237 lb 14 oz) 107.9 kg (237 lb 14 oz)    Examination:  General exam:  Adult male.  Mild respiratory distress with SCM retractions, tachypnea. Difficult historian HEENT:  NCAT, MMM Respiratory system:  Wheezes bilaterally. No rales or rhonchi.  Cardiovascular system: IRRR, tachycardic. Warm extremities Gastrointestinal system:  Normal active bowel sounds, distended, soft and nontender.  Skin: No rashes, lesions or ulcers MSK:  Normal tone and bulk, 2+ pitting bilateral lower extremity edema Neuro:  Grossly intact Psychiatry:  Poor insight.  Does not seem concerned that his noncompliance with blood thinner leaves him at high risk for stroke and death.     Data Reviewed: I have personally reviewed following labs and imaging studies  CBC:  Recent Labs Lab 03/14/16 1439 03/15/16 0525  WBC 7.0 7.2  NEUTROABS 5.0  --   HGB 12.7* 12.2*  HCT 41.5 39.7  MCV 89.4 88.8  PLT 191 168   Basic Metabolic Panel:  Recent Labs Lab 03/14/16 1351 03/15/16 0525  NA 135 134*  K 4.5 4.0  CL 104 104  CO2 18* 19*  GLUCOSE 146* 124*  BUN 38* 37*  CREATININE 2.01* 1.73*  CALCIUM 9.0 8.5*   GFR: Estimated Creatinine Clearance: 50.7 mL/min (by C-G formula based on Cr of 1.73). Liver Function Tests: No results for input(s): AST, ALT, ALKPHOS, BILITOT, PROT, ALBUMIN in the last 168 hours. No results for input(s): LIPASE, AMYLASE in the last 168 hours. No results for  input(s): AMMONIA in the last 168 hours. Coagulation Profile:  Recent Labs Lab 03/14/16 1439  INR 1.57*   Cardiac Enzymes:  Recent Labs Lab 03/14/16 2335 03/15/16 0525  TROPONINI 0.42* 0.55*   BNP (last 3 results) No results for input(s): PROBNP in the last 8760 hours. HbA1C: No results for input(s): HGBA1C in the last 72 hours. CBG: No results for input(s): GLUCAP in the last 168 hours. Lipid Profile:  Recent Labs  03/15/16 0525  CHOL 106  HDL 16*  LDLCALC 75  TRIG 77  CHOLHDL 6.6   Thyroid Function Tests:  Recent Labs  03/15/16 0949  TSH 3.565   Anemia Panel: No results for input(s): VITAMINB12, FOLATE, FERRITIN, TIBC, IRON, RETICCTPCT in the last 72 hours. Urine analysis:    Component Value Date/Time   COLORURINE YELLOW 05/09/2014 0138   APPEARANCEUR CLEAR 05/09/2014 0138   LABSPEC 1.023 05/09/2014 0138   PHURINE 5.0 05/09/2014  0138   GLUCOSEU NEGATIVE 05/09/2014 0138   HGBUR TRACE* 05/09/2014 0138   BILIRUBINUR NEGATIVE 05/09/2014 0138   KETONESUR 15* 05/09/2014 0138   PROTEINUR 30* 05/09/2014 0138   UROBILINOGEN 0.2 05/09/2014 0138   NITRITE NEGATIVE 05/09/2014 0138   LEUKOCYTESUR NEGATIVE 05/09/2014 0138   Sepsis Labs: @LABRCNTIP (procalcitonin:4,lacticidven:4)  ) Recent Results (from the past 240 hour(s))  MRSA PCR Screening     Status: None   Collection Time: 03/14/16  8:30 PM  Result Value Ref Range Status   MRSA by PCR NEGATIVE NEGATIVE Final    Comment:        The GeneXpert MRSA Assay (FDA approved for NASAL specimens only), is one component of a comprehensive MRSA colonization surveillance program. It is not intended to diagnose MRSA infection nor to guide or monitor treatment for MRSA infections.       Radiology Studies: Dg Chest 2 View  03/14/2016  CLINICAL DATA:  Chronic shortness of breath. EXAM: CHEST  2 VIEW COMPARISON:  12/28/2015 FINDINGS: Both lungs are clear. Heart and mediastinum are within normal limits. No  pleural effusions. Degenerative changes in the thoracic spine. IMPRESSION: No acute cardiopulmonary disease. Electronically Signed   By: Richarda Overlie M.D.   On: 03/14/2016 14:06     Scheduled Meds: . digoxin  0.125 mg Oral Daily  . enoxaparin (LOVENOX) injection  1 mg/kg Subcutaneous Q12H  . metoprolol tartrate  12.5 mg Oral Q6H  . sodium chloride flush  3 mL Intravenous Q12H   Continuous Infusions: . diltiazem (CARDIZEM) infusion Stopped (03/15/16 0900)     LOS: 1 day    Time spent: 30 min    Renae Fickle, MD [facility] Pager [phone]  If 7PM-7AM, please contact night-coverage [email or web address] Password [facility] 03/15/2016, 3:39 PM

## 2016-03-16 DIAGNOSIS — R7989 Other specified abnormal findings of blood chemistry: Secondary | ICD-10-CM

## 2016-03-16 DIAGNOSIS — I5043 Acute on chronic combined systolic (congestive) and diastolic (congestive) heart failure: Secondary | ICD-10-CM

## 2016-03-16 DIAGNOSIS — Z9114 Patient's other noncompliance with medication regimen: Secondary | ICD-10-CM

## 2016-03-16 DIAGNOSIS — I213 ST elevation (STEMI) myocardial infarction of unspecified site: Secondary | ICD-10-CM

## 2016-03-16 DIAGNOSIS — F329 Major depressive disorder, single episode, unspecified: Secondary | ICD-10-CM

## 2016-03-16 DIAGNOSIS — F330 Major depressive disorder, recurrent, mild: Secondary | ICD-10-CM

## 2016-03-16 LAB — HEMOGLOBIN A1C
HEMOGLOBIN A1C: 6.5 % — AB (ref 4.8–5.6)
MEAN PLASMA GLUCOSE: 140 mg/dL

## 2016-03-16 LAB — BASIC METABOLIC PANEL
ANION GAP: 11 (ref 5–15)
BUN: 37 mg/dL — ABNORMAL HIGH (ref 6–20)
CHLORIDE: 104 mmol/L (ref 101–111)
CO2: 19 mmol/L — AB (ref 22–32)
CREATININE: 1.62 mg/dL — AB (ref 0.61–1.24)
Calcium: 8.3 mg/dL — ABNORMAL LOW (ref 8.9–10.3)
GFR calc non Af Amer: 43 mL/min — ABNORMAL LOW (ref >60)
GFR, EST AFRICAN AMERICAN: 50 mL/min — AB (ref >60)
Glucose, Bld: 134 mg/dL — ABNORMAL HIGH (ref 65–99)
Potassium: 4 mmol/L (ref 3.5–5.1)
Sodium: 134 mmol/L — ABNORMAL LOW (ref 135–145)

## 2016-03-16 LAB — CBC
HEMATOCRIT: 41.3 % (ref 39.0–52.0)
HEMOGLOBIN: 12.7 g/dL — AB (ref 13.0–17.0)
MCH: 28.1 pg (ref 26.0–34.0)
MCHC: 30.8 g/dL (ref 30.0–36.0)
MCV: 91.4 fL (ref 78.0–100.0)
Platelets: 192 10*3/uL (ref 150–400)
RBC: 4.52 MIL/uL (ref 4.22–5.81)
RDW: 18.9 % — ABNORMAL HIGH (ref 11.5–15.5)
WBC: 8.5 10*3/uL (ref 4.0–10.5)

## 2016-03-16 MED ORDER — FUROSEMIDE 10 MG/ML IJ SOLN
40.0000 mg | Freq: Once | INTRAMUSCULAR | Status: AC
  Administered 2016-03-16: 40 mg via INTRAVENOUS
  Filled 2016-03-16: qty 4

## 2016-03-16 MED ORDER — ARIPIPRAZOLE 2 MG PO TABS
2.0000 mg | ORAL_TABLET | Freq: Two times a day (BID) | ORAL | Status: DC
  Filled 2016-03-16 (×16): qty 1

## 2016-03-16 MED ORDER — ESCITALOPRAM OXALATE 10 MG PO TABS
5.0000 mg | ORAL_TABLET | Freq: Every day | ORAL | Status: DC
  Filled 2016-03-16 (×4): qty 1

## 2016-03-16 NOTE — Progress Notes (Addendum)
PROGRESS NOTE  [name]  ZOX:[id number] DOB: 09/16/1951 DOA: 03/14/2016 PCP: [name], MD Outpatient Specialists:  Dr. Tresa Endo, Cardiology From home alone  Brief Narrative:   [name] is a 65 y.o. male with history of uncontrolled type 2 diabetes, chronic kidney disease stage III, morbid obesity, chronic lower extremity edema, hypertension, chronic systolic heart failure EF 20-25%, obstructive sleep apnea, atrial fibrillation, noncompliant with his coumadin as well as his other anti-arrhythmics presented to the emergency room with worsening lower extremity edema and abdominal bloating.  He denied chest pain or palpitations.  He was hospitalized in February 2017 with atrial fibrillation with RVR and had transesophageal echocardiogram on 2/27.  He was found to have a LA thrombus.  His DCCV cardioversion was canceled.  History was difficult to obtain, numerous tangental thought processes.    Assessment & Plan:   Principal Problem:   Atrial fibrillation with rapid ventricular response (HCC) Active Problems:   Swelling of lower extremity   Depression   Type 2 diabetes, uncontrolled, with renal manifestation (HCC)   CKD (chronic kidney disease), stage III   HTN (hypertension)   Elevated troponin   Acute on chronic systolic and diastolic heart failure, NYHA class 3 (HCC)   Left atrial thrombus (HCC)   Noncompliance with medication regimen   Atrial fibrillation with RVR, CHAD2s- Vasc score 4 (htn, dm, age, chf).  Diltiazem relatively contraindicated in the setting of systolic heart failure.  HR about 100bpm -  Continue metoprolol 25 mg every 6 hours, hold for systolic blood pressure less than 110 -  Continue digoxin.  -  If still tachycardic, will load with digoxin -  Check digoxin level tomorrow given CKD -  Continue Lovenox therapeutic dose   Acute on chronic systolic congestive heart failure, biventricular failure, echo 12/30/15 ef 20-25%, currently 15-20%  with blunted IVC.  Chest x-ray was clear.  -  Continue BB -  Hold ACEI due to CKD and possibility of hypotension -  Trial of lasix  IV once and if blood pressure remains stable, will diurese with  IV BID -  Daily weights -  Strict I/O  Left  Ventricular 1.9 x 2.1cm apical thrombus that is partially mobile -  Anticoagulation with lovenox for now -  Monitor for strokes  Elevated troponins, chest pain free in setting of CKD, a-fib with RVR, and heart failure.  Possible CAD. -  Due to medication noncompliance and CKD, not a good candidate for heart catheterization -  Continue aspirin, BB, and high dose statin  Depression/psychosocial issues/noncompliance - off all meds x 1 month.  States he has anxiety that prevents him from leaving his house and taking his medications.  Reiterated that he is a "65 year old stuck in a 66 year old body."  States that he wants to die and wants to petition the [employer] to allow him to be euthanized.  No active plan to kill himself. -  Psychiatry consultation -  Resume lexapro and abilify -  Please reassess capacity - not sure if I can change him to DNR as he requests -  SW and CM.  Unlikely that he could afford ALF.  Needs maximum home health services vs. 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  Leaving at full code pending psychiatry assessment Family Communication:  Patient alone, discussed test results and plan, questions answered Disposition Plan:   Palliative care consult to clarify patient's goals of care.  Psychiatry consult for reasons listed above.  I suspect he will not qualify for SNF.  He cannot afford ALF and HH will not  provide enough services to keep him out of hospital.  Recommended hospice care if he qualifies given his readmissions, goals of care, and EF of 15-20%.     Consultants:   Psychiatry  Palliative care  Procedures:  ECHO  Antimicrobials:   none    Subjective: Patient states that he wants to die and wants to be euthanized.  He states he will not take his heart medications when he leaves the hospital because they make him feel "high."  Denies chest pain.  Shortness of breath and swelling are the same as when he was admitted.  Legs and belly still feel swollen.    Objective: Filed Vitals:   03/15/16 2345 03/16/16 0400 03/16/16 0618 03/16/16 0807  BP:  113/88 116/98 127/95  Pulse: 94  111 93  Temp:  97.6 F (36.4 C)  98 F (36.7 C)  TempSrc:  Oral  Oral  Resp:    16  Height:      Weight:  108.6 kg (239 lb 6.7 oz)    SpO2:    95%    Intake/Output Summary (Last 24 hours) at 03/16/16 0849 Last data filed at 03/16/16 0400  Gross per 24 hour  Intake    720 ml  Output    875 ml  Net   -155 ml   Filed Weights   03/14/16 2100 03/15/16 0400 03/16/16 0400  Weight: 107.9 kg (237 lb 14 oz) 107.9 kg (237 lb 14 oz) 108.6 kg (239 lb 6.7 oz)    Examination:  General exam:  Adult male.  Mild respiratory distress with SCM retractions, tachypnea. Difficult historian HEENT:  NCAT, MMM Respiratory system:  Wheezes bilaterally. No rales or rhonchi.  Cardiovascular system: IRRR, tachycardic. Warm extremities Gastrointestinal system:  Normal active bowel sounds, distended, soft and nontender.  Skin: No rashes, lesions or ulcers MSK:  Normal tone and bulk, 2+ pitting bilateral lower extremity edema Neuro:  Grossly intact Psychiatry:  Poor insight.    Data Reviewed: I have personally reviewed following labs and imaging studies  CBC:  Recent Labs Lab 03/14/16 1439 03/15/16 0525 03/16/16 0439  WBC 7.0 7.2 8.5  NEUTROABS 5.0  --   --   HGB 12.7* 12.2* 12.7*  HCT 41.5 39.7 41.3    MCV 89.4 88.8 91.4  PLT 191 168 192   Basic Metabolic Panel:  Recent Labs Lab 03/14/16 1351 03/15/16 0525 03/16/16 0439  NA 135 134* 134*  K 4.5 4.0 4.0  CL 104 104 104  CO2 18* 19* 19*  GLUCOSE 146* 124* 134*  BUN 38* 37* 37*  CREATININE 2.01* 1.73* 1.62*  CALCIUM 9.0 8.5* 8.3*   GFR: Estimated Creatinine Clearance: 54.3 mL/min (by C-G formula based on Cr of 1.62). Liver Function Tests: No results for input(s): AST, ALT, ALKPHOS, BILITOT, PROT, ALBUMIN in the last 168 hours. No results for input(s): LIPASE, AMYLASE in the last 168 hours. No results for input(s): AMMONIA in the last 168 hours. Coagulation Profile:  Recent Labs Lab 03/14/16 1439  INR 1.57*   Cardiac Enzymes:  Recent Labs  Lab 03/14/16 2335 03/15/16 0525  TROPONINI 0.42* 0.55*   BNP (last 3 results) No results for input(s): PROBNP in the last 8760 hours. HbA1C: No results for input(s): HGBA1C in the last 72 hours. CBG: No results for input(s): GLUCAP in the last 168 hours. Lipid Profile:  Recent Labs  03/15/16 0525  CHOL 106  HDL 16*  LDLCALC 75  TRIG 77  CHOLHDL 6.6   Thyroid Function Tests:  Recent Labs  03/15/16 0949  TSH 3.565   Anemia Panel: No results for input(s): VITAMINB12, FOLATE, FERRITIN, TIBC, IRON, RETICCTPCT in the last 72 hours. Urine analysis:    Component Value Date/Time   COLORURINE YELLOW 05/09/2014 0138   APPEARANCEUR CLEAR 05/09/2014 0138   LABSPEC 1.023 05/09/2014 0138   PHURINE 5.0 05/09/2014 0138   GLUCOSEU NEGATIVE 05/09/2014 0138   HGBUR TRACE* 05/09/2014 0138   BILIRUBINUR NEGATIVE 05/09/2014 0138   KETONESUR 15* 05/09/2014 0138   PROTEINUR 30* 05/09/2014 0138   UROBILINOGEN 0.2 05/09/2014 0138   NITRITE NEGATIVE 05/09/2014 0138   LEUKOCYTESUR NEGATIVE 05/09/2014 0138   Sepsis Labs: @LABRCNTIP (procalcitonin:4,lacticidven:4)  ) Recent Results (from the past 240 hour(s))  MRSA PCR Screening     Status: None   Collection Time: 03/14/16   8:30 PM  Result Value Ref Range Status   MRSA by PCR NEGATIVE NEGATIVE Final    Comment:        The GeneXpert MRSA Assay (FDA approved for NASAL specimens only), is one component of a comprehensive MRSA colonization surveillance program. It is not intended to diagnose MRSA infection nor to guide or monitor treatment for MRSA infections.       Radiology Studies: Dg Chest 2 View  03/14/2016  CLINICAL DATA:  Chronic shortness of breath. EXAM: CHEST  2 VIEW COMPARISON:  12/28/2015 FINDINGS: Both lungs are clear. Heart and mediastinum are within normal limits. No pleural effusions. Degenerative changes in the thoracic spine. IMPRESSION: No acute cardiopulmonary disease. Electronically Signed   By: Richarda Overlie M.D.   On: 03/14/2016 14:06     Scheduled Meds: . atorvastatin  40 mg Oral q1800  . digoxin  0.125 mg Oral Daily  . enoxaparin (LOVENOX) injection  1 mg/kg Subcutaneous Q12H  . metoprolol tartrate  25 mg Oral Q6H  . sodium chloride flush  3 mL Intravenous Q12H   Continuous Infusions:     LOS: 2 days    Time spent: 30 min    Renae Fickle, MD [facility] Pager [phone]  If 7PM-7AM, please contact night-coverage [email or web address] Password [facility] 03/16/2016, 8:49 AM

## 2016-03-16 NOTE — Progress Notes (Signed)
PT Cancellation Note  Patient Details Name: [name] MRN: [id number] DOB: 14-Apr-1951   Cancelled Treatment:    Reason Eval/Treat Not Completed: Medical issues which prohibited therapy (Troponin trending up).  Will attempt to see pt once medically appropriate.  Encarnacion Chu PT, DPT  Pager: [phone] Phone: [phone] 03/16/2016, 9:03 AM

## 2016-03-16 NOTE — Consult Note (Signed)
[place] Psychiatry Consult   Reason for Consult:  Depression and noncompliance with treatment Referring Physician:  Dr. Sheran Fava Patient Identification: [name] MRN:  [id number] Principal Diagnosis: Noncompliance with medication regimen Diagnosis:   Patient Active Problem List   Diagnosis Date Noted  . Atrial fibrillation with rapid ventricular response ([place]) [I48.91] 03/14/2016  . Noncompliance with medication regimen [Z91.14] 03/14/2016  . 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 Hypertensive heart disease with heart failure ([place]) [I11.0]   . Acute on chronic systolic and diastolic heart failure, NYHA class 3 ([place]) [I50.43]   . Elevated troponin [R79.89]   . Atrial flutter ([place]) [I48.92] 12/28/2015  . Encounter for therapeutic drug monitoring [Z51.81] 05/24/2014  . HTN (hypertension) [I10] 05/21/2014  . Chronic systolic heart failure ([place]) [I50.22] 05/15/2014  . Obstructive sleep apnea [G47.33] 05/13/2014  . AKI (acute kidney injury) ([place]) [N17.9] 05/09/2014  . Swelling of lower extremity [M79.89] 05/09/2014  . Depression [F32.9] 05/09/2014  . Type 2 diabetes, uncontrolled, with renal manifestation ([place]) [E11.29, E11.65] 05/09/2014  . CKD (chronic kidney disease), stage III [N18.3] 05/09/2014  . Obesity (BMI 30-39.9) [E66.9] 05/09/2014  . Ascites [R18.8] 05/09/2014    Total Time spent with patient: 1 hour  Subjective:   [name] is a 65 y.o. male patient admitted with depression and non compliance with treatment.  HPI:  [name] is a 65 y.o. male with history of type 2 diabetes, uncontrolled, chronic kidney disease stage III, morbid obesity, chronic lower extremity edema, hypertension, chronic systolic heart failure, obstructive sleep apnea, atrial fibrillation, noncompliant with his Coumadin as well as his other anti-arrhythmics. Patient admitted to [facility] with worsening lower extremity edema and also abdominal bloating for  the last month.Pt states lives in home alone, eating ok, no abd complaints except abdominal bloating. Patient reported he takes his medication when hospital prescribed but he does not feel like he can go and receive outpatient medication management because it cost too much money and also feces not able to keep up with the multiple doctors appointments. Patient claims he has been trapped as a 65 years old in grownup 65 years old body. Patient has intact cognition, memory, language functions and also able to understand his medical condition and treatment needs. Patient also understands she does not take care of his medical needs his medical condition may get worse. Patient was known to this physician from his previous hospital encounter is and previous psychiatric consultations for similar clinical presentation. Review of medical records indicated patient has been depressed since he got separated from his wife and children since 2004 and initially was seen a counselor and also taken psychiatric medication management. Patient stated he he cannot make decisions about getting treatment at this time. Patient is willing to take medication while in the hospital and also when prescription was given but vague about following up with outpatient medication management.  Past Psychiatric History: He has history of depression since 2004 when he was separated from his wife and was admitted to [facility] several times prior to 2010   Risk to Self: Is patient at risk for suicide?: No Risk to Others:   Prior Inpatient Therapy:   Prior Outpatient Therapy:    Past Medical History:  Past Medical History  Diagnosis Date  . A-fib ([place])     a. (06/04/14) TEE-DC-CV; succesful; large LA 6.2 cm  . Depression   . Type 2 diabetes mellitus ([place])   . CKD (chronic kidney disease) stage  3, GFR 30-59 ml/min   . OSA (obstructive sleep apnea)   . Chronic systolic heart failure ([place])     a. ECHO (05/2014): EF 25-30%, diff HK, RV midly dilated  and sys fx mildly/mod reduced  . HTN (hypertension)     Past Surgical History  Procedure Laterality Date  . Vasectomy    . Nose surgery      Nasal septum surgery  . Tee without cardioversion N/A 06/04/2014    Procedure: TRANSESOPHAGEAL ECHOCARDIOGRAM (TEE);  Surgeon: Larey Dresser, MD;  Location: [facility];  Service: Cardiovascular;  Laterality: N/A;  . Cardioversion N/A 06/04/2014    Procedure: CARDIOVERSION;  Surgeon: Larey Dresser, MD;  Location: [facility];  Service: Cardiovascular;  Laterality: N/A;  . Tee without cardioversion N/A 01/06/2016    Procedure: TRANSESOPHAGEAL ECHOCARDIOGRAM (TEE);  Surgeon: Fay Records, MD;  Location: [facility] ENDOSCOPY;  Service: Cardiovascular;  Laterality: N/A;   Family History:  Family History  Problem Relation Age of Onset  . Heart attack Mother     deceased  . Diabetes Mother    Family Psychiatric  History: Unknown patient has a limited or no contact with his children and ex-wife. Social History:  History  Alcohol Use No     History  Drug Use No    Social History   Social History  . Marital Status: Divorced    Spouse Name: N/A  . Number of Children: N/A  . Years of Education: N/A   Social History Main Topics  . Smoking status: Former Smoker    Types: Cigars  . Smokeless tobacco: None     Comment: Smoked when he was in his teens  . Alcohol Use: No  . Drug Use: No  . Sexual Activity: Not Asked   Other Topics Concern  . None   Social History Narrative   Lives in [place] by himself. Retired from [employer] and [employer] for [employer].    Additional Social History:    Allergies:  No Known Allergies  Labs:  Results for orders placed or performed during the hospital encounter of 03/14/16 (from the past 48 hour(s))  I-stat troponin, ED     Status: Abnormal   Collection Time: 03/14/16  1:48 PM  Result Value Ref Range   Troponin i, poc 0.79 (HH) 0.00 - 0.08 ng/mL   Comment NOTIFIED PHYSICIAN    Comment 3             Comment: Due to the release kinetics of cTnI, a negative result within the first hours of the onset of symptoms does not rule out myocardial infarction with certainty. If myocardial infarction is still suspected, repeat the test at appropriate intervals.   Basic metabolic panel     Status: Abnormal   Collection Time: 03/14/16  1:51 PM  Result Value Ref Range   Sodium 135 135 - 145 mmol/L   Potassium 4.5 3.5 - 5.1 mmol/L   Chloride 104 101 - 111 mmol/L   CO2 18 (L) 22 - 32 mmol/L   Glucose, Bld 146 (H) 65 - 99 mg/dL   BUN 38 (H) 6 - 20 mg/dL   Creatinine, Ser 2.01 (H) 0.61 - 1.24 mg/dL   Calcium 9.0 8.9 - 10.3 mg/dL   GFR calc non Af Amer 33 (L) >60 mL/min   GFR calc Af Amer 38 (L) >60 mL/min    Comment: (NOTE) The eGFR has been calculated using the CKD EPI equation. This calculation has not been validated in all  clinical situations. eGFR's persistently <60 mL/min signify possible Chronic Kidney Disease.    Anion gap 13 5 - 15  CBC with Differential/Platelet     Status: Abnormal   Collection Time: 03/14/16  2:39 PM  Result Value Ref Range   WBC 7.0 4.0 - 10.5 K/uL   RBC 4.64 4.22 - 5.81 MIL/uL   Hemoglobin 12.7 (L) 13.0 - 17.0 g/dL   HCT 41.5 39.0 - 52.0 %   MCV 89.4 78.0 - 100.0 fL   MCH 27.4 26.0 - 34.0 pg   MCHC 30.6 30.0 - 36.0 g/dL   RDW 18.5 (H) 11.5 - 15.5 %   Platelets 191 150 - 400 K/uL   Neutrophils Relative % 71 %   Neutro Abs 5.0 1.7 - 7.7 K/uL   Lymphocytes Relative 20 %   Lymphs Abs 1.4 0.7 - 4.0 K/uL   Monocytes Relative 8 %   Monocytes Absolute 0.5 0.1 - 1.0 K/uL   Eosinophils Relative 1 %   Eosinophils Absolute 0.0 0.0 - 0.7 K/uL   Basophils Relative 0 %   Basophils Absolute 0.0 0.0 - 0.1 K/uL  Digoxin level     Status: Abnormal   Collection Time: 03/14/16  2:39 PM  Result Value Ref Range   Digoxin Level <0.2 (L) 0.8 - 2.0 ng/mL  Protime-INR     Status: Abnormal   Collection Time: 03/14/16  2:39 PM  Result Value Ref Range   Prothrombin Time  18.8 (H) 11.6 - 15.2 seconds   INR 1.57 (H) 0.00 - 1.49  MRSA PCR Screening     Status: None   Collection Time: 03/14/16  8:30 PM  Result Value Ref Range   MRSA by PCR NEGATIVE NEGATIVE    Comment:        The GeneXpert MRSA Assay (FDA approved for NASAL specimens only), is one component of a comprehensive MRSA colonization surveillance program. It is not intended to diagnose MRSA infection nor to guide or monitor treatment for MRSA infections.   Hemoglobin A1c     Status: Abnormal   Collection Time: 03/14/16 11:25 PM  Result Value Ref Range   Hgb A1c MFr Bld 6.5 (H) 4.8 - 5.6 %    Comment: (NOTE)         Pre-diabetes: 5.7 - 6.4         Diabetes: >6.4         Glycemic control for adults with diabetes: <7.0    Mean Plasma Glucose 140 mg/dL    Comment: (NOTE) Performed At: [facility] [place], [place] [id number] Lindon Romp MD [id number]   Troponin I     Status: Abnormal   Collection Time: 03/14/16 11:35 PM  Result Value Ref Range   Troponin I 0.42 (H) <0.031 ng/mL    Comment:        PERSISTENTLY INCREASED TROPONIN VALUES IN THE RANGE OF 0.04-0.49 ng/mL CAN BE SEEN IN:       -UNSTABLE ANGINA       -CONGESTIVE HEART FAILURE       -MYOCARDITIS       -CHEST TRAUMA       -ARRYHTHMIAS       -LATE PRESENTING MYOCARDIAL INFARCTION       -COPD   CLINICAL FOLLOW-UP RECOMMENDED.   Troponin I     Status: Abnormal   Collection Time: 03/15/16  5:25 AM  Result Value Ref Range   Troponin I 0.55 (HH) <0.031 ng/mL    Comment:  POSSIBLE MYOCARDIAL ISCHEMIA. SERIAL TESTING RECOMMENDED. CRITICAL RESULT CALLED TO, READ BACK BY AND VERIFIED WITH: ROBERTS,J RN 03/15/2016 3428 JORDANS   Basic metabolic panel     Status: Abnormal   Collection Time: 03/15/16  5:25 AM  Result Value Ref Range   Sodium 134 (L) 135 - 145 mmol/L   Potassium 4.0 3.5 - 5.1 mmol/L   Chloride 104 101 - 111 mmol/L   CO2 19 (L) 22 - 32 mmol/L   Glucose, Bld 124 (H) 65  - 99 mg/dL   BUN 37 (H) 6 - 20 mg/dL   Creatinine, Ser 1.73 (H) 0.61 - 1.24 mg/dL   Calcium 8.5 (L) 8.9 - 10.3 mg/dL   GFR calc non Af Amer 40 (L) >60 mL/min   GFR calc Af Amer 46 (L) >60 mL/min    Comment: (NOTE) The eGFR has been calculated using the CKD EPI equation. This calculation has not been validated in all clinical situations. eGFR's persistently <60 mL/min signify possible Chronic Kidney Disease.    Anion gap 11 5 - 15  Lipid panel     Status: Abnormal   Collection Time: 03/15/16  5:25 AM  Result Value Ref Range   Cholesterol 106 0 - 200 mg/dL   Triglycerides 77 <150 mg/dL   HDL 16 (L) >40 mg/dL   Total CHOL/HDL Ratio 6.6 RATIO   VLDL 15 0 - 40 mg/dL   LDL Cholesterol 75 0 - 99 mg/dL    Comment:        Total Cholesterol/HDL:CHD Risk Coronary Heart Disease Risk Table                     Men   Women  1/2 Average Risk   3.4   3.3  Average Risk       5.0   4.4  2 X Average Risk   9.6   7.1  3 X Average Risk  23.4   11.0        Use the calculated Patient Ratio above and the CHD Risk Table to determine the patient's CHD Risk.        ATP III CLASSIFICATION (LDL):  <100     mg/dL   Optimal  100-129  mg/dL   Near or Above                    Optimal  130-159  mg/dL   Borderline  160-189  mg/dL   High  >190     mg/dL   Very High   CBC     Status: Abnormal   Collection Time: 03/15/16  5:25 AM  Result Value Ref Range   WBC 7.2 4.0 - 10.5 K/uL   RBC 4.47 4.22 - 5.81 MIL/uL   Hemoglobin 12.2 (L) 13.0 - 17.0 g/dL   HCT 39.7 39.0 - 52.0 %   MCV 88.8 78.0 - 100.0 fL   MCH 27.3 26.0 - 34.0 pg   MCHC 30.7 30.0 - 36.0 g/dL   RDW 18.5 (H) 11.5 - 15.5 %   Platelets 168 150 - 400 K/uL  TSH     Status: None   Collection Time: 03/15/16  9:49 AM  Result Value Ref Range   TSH 3.565 0.350 - 4.500 uIU/mL  Basic metabolic panel     Status: Abnormal   Collection Time: 03/16/16  4:39 AM  Result Value Ref Range   Sodium 134 (L) 135 - 145 mmol/L   Potassium 4.0 3.5 - 5.1 mmol/L  Chloride 104 101 - 111 mmol/L   CO2 19 (L) 22 - 32 mmol/L   Glucose, Bld 134 (H) 65 - 99 mg/dL   BUN 37 (H) 6 - 20 mg/dL   Creatinine, Ser 1.62 (H) 0.61 - 1.24 mg/dL   Calcium 8.3 (L) 8.9 - 10.3 mg/dL   GFR calc non Af Amer 43 (L) >60 mL/min   GFR calc Af Amer 50 (L) >60 mL/min    Comment: (NOTE) The eGFR has been calculated using the CKD EPI equation. This calculation has not been validated in all clinical situations. eGFR's persistently <60 mL/min signify possible Chronic Kidney Disease.    Anion gap 11 5 - 15  CBC     Status: Abnormal   Collection Time: 03/16/16  4:39 AM  Result Value Ref Range   WBC 8.5 4.0 - 10.5 K/uL   RBC 4.52 4.22 - 5.81 MIL/uL   Hemoglobin 12.7 (L) 13.0 - 17.0 g/dL   HCT 41.3 39.0 - 52.0 %   MCV 91.4 78.0 - 100.0 fL   MCH 28.1 26.0 - 34.0 pg   MCHC 30.8 30.0 - 36.0 g/dL   RDW 18.9 (H) 11.5 - 15.5 %   Platelets 192 150 - 400 K/uL    Current Facility-Administered Medications  Medication Dose Route Frequency Provider Last Rate Last Dose  . 0.9 %  sodium chloride infusion  250 mL Intravenous PRN Maren Reamer, MD      . acetaminophen (TYLENOL) tablet 650 mg  650 mg Oral Q4H PRN Maren Reamer, MD      . ARIPiprazole (ABILIFY) tablet 2 mg  2 mg Oral BID Janece Canterbury, MD   2 mg at 03/16/16 1003  . atorvastatin (LIPITOR) tablet 40 mg  40 mg Oral q1800 Janece Canterbury, MD   40 mg at 03/15/16 1743  . digoxin (LANOXIN) tablet 0.125 mg  0.125 mg Oral Daily Janece Canterbury, MD   0.125 mg at 03/16/16 1003  . enoxaparin (LOVENOX) injection 110 mg  1 mg/kg Subcutaneous Q12H Maren Reamer, MD   110 mg at 03/16/16 0439  . escitalopram (LEXAPRO) tablet 5 mg  5 mg Oral Daily Janece Canterbury, MD   5 mg at 03/16/16 1003  . metoprolol tartrate (LOPRESSOR) tablet 25 mg  25 mg Oral Q6H Janece Canterbury, MD   25 mg at 03/16/16 0618  . ondansetron (ZOFRAN) injection 4 mg  4 mg Intravenous Q6H PRN Maren Reamer, MD      . sodium chloride flush (NS) 0.9 %  injection 3 mL  3 mL Intravenous Q12H Maren Reamer, MD   3 mL at 03/16/16 1004  . sodium chloride flush (NS) 0.9 % injection 3 mL  3 mL Intravenous PRN Maren Reamer, MD        Musculoskeletal: Strength & Muscle Tone: decreased Gait & Station: unable to stand Patient leans: N/A  Psychiatric Specialty Exam: ROS  No Fever-chills, No Headache, No changes with Vision or hearing, reports vertigo No problems swallowing food or Liquids, No Chest pain, Cough or Shortness of Breath, No Abdominal pain, No Nausea or Vommitting, Bowel movements are regular, No Blood in stool or Urine, No dysuria, No new skin rashes or bruises, No new joints pains-aches,  No new weakness, tingling, numbness in any extremity, No recent weight gain or loss, No polyuria, polydypsia or polyphagia,   A full 10 point Review of Systems was done, except as stated above, all other Review of Systems were negative.  Blood  pressure 127/95, pulse 93, temperature 98 F (36.7 C), temperature source Oral, resp. rate 16, height _0  (1.727 m), weight 108.6 kg (239 lb 6.7 oz), SpO2 95 %.Body mass index is 36.41 kg/(m^2).  General Appearance: Casual  Eye Contact::  Good  Speech:  Clear and Coherent  Volume:  Normal  Mood:  Anxious and Depressed  Affect:  Appropriate and Congruent  Thought Process:  Coherent and Goal Directed  Orientation:  Full (Time, Place, and Person)  Thought Content:  Rumination  Suicidal Thoughts:  No  Homicidal Thoughts:  No  Memory:  Immediate;   Good Recent;   Fair  Judgement:  Impaired  Insight:  Fair  Psychomotor Activity:  Decreased  Concentration:  Fair  Recall:  [employer] of Knowledge:Fair  Language: Good  Akathisia:  Negative  Handed:  Right  AIMS (if indicated):     Assets:  Communication Skills Desire for Improvement Financial Resources/Insurance Housing Leisure Time Resilience  ADL's:  Intact  Cognition: WNL  Sleep:      Treatment Plan Summary: Patient has  been suffering with major depressive disorder recurrent, 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  Patient will restart Lexapro 10 mg daily for depression and Abilify 2 mg twice daily as a booster Patient meets criteria for capacity to make his own medical decisions and living arrangements Encourage to be compliant with outpatient medication management.   Appreciate psychiatric consultation and we sign off as of today Please contact [phone] or [phone] if needs further assistance  Disposition: Patient does not meet criteria for psychiatric inpatient admission. Supportive therapy provided about ongoing stressors.  Durward Parcel., MD 03/16/2016 11:32 AM

## 2016-03-16 NOTE — Progress Notes (Signed)
OT Cancellation Note  Patient Details Name: [name] MRN: [id number] DOB: June 10, 1951   Cancelled Treatment:    Reason Eval/Treat Not Completed: Medical issues which prohibited therapy. Troponins trending up. Will check back later and assess when appropriate.  [facility] Havish Petties, OTR/L  [phone] 03/16/2016 03/16/2016, 8:52 AM

## 2016-03-16 NOTE — Progress Notes (Signed)
Offered oral care and bath. Pt refused bath, request to do oral care later.

## 2016-03-17 LAB — CBC
HEMATOCRIT: 39.7 % (ref 39.0–52.0)
HEMOGLOBIN: 12 g/dL — AB (ref 13.0–17.0)
MCH: 27.1 pg (ref 26.0–34.0)
MCHC: 30.2 g/dL (ref 30.0–36.0)
MCV: 89.6 fL (ref 78.0–100.0)
PLATELETS: 156 10*3/uL (ref 150–400)
RBC: 4.43 MIL/uL (ref 4.22–5.81)
RDW: 18.7 % — AB (ref 11.5–15.5)
WBC: 6.4 10*3/uL (ref 4.0–10.5)

## 2016-03-17 LAB — BASIC METABOLIC PANEL
ANION GAP: 11 (ref 5–15)
BUN: 33 mg/dL — ABNORMAL HIGH (ref 6–20)
CALCIUM: 8.2 mg/dL — AB (ref 8.9–10.3)
CO2: 24 mmol/L (ref 22–32)
Chloride: 102 mmol/L (ref 101–111)
Creatinine, Ser: 1.68 mg/dL — ABNORMAL HIGH (ref 0.61–1.24)
GFR, EST AFRICAN AMERICAN: 48 mL/min — AB (ref >60)
GFR, EST NON AFRICAN AMERICAN: 41 mL/min — AB (ref >60)
Glucose, Bld: 92 mg/dL (ref 65–99)
POTASSIUM: 3.9 mmol/L (ref 3.5–5.1)
Sodium: 137 mmol/L (ref 135–145)

## 2016-03-17 LAB — DIGOXIN LEVEL

## 2016-03-17 MED ORDER — FUROSEMIDE 10 MG/ML IJ SOLN
40.0000 mg | Freq: Two times a day (BID) | INTRAMUSCULAR | Status: DC
  Administered 2016-03-17 – 2016-03-21 (×9): 40 mg via INTRAVENOUS
  Filled 2016-03-17 (×9): qty 4

## 2016-03-17 MED ORDER — WARFARIN SODIUM 7.5 MG PO TABS
7.5000 mg | ORAL_TABLET | Freq: Once | ORAL | Status: AC
  Administered 2016-03-17: 7.5 mg via ORAL
  Filled 2016-03-17: qty 1

## 2016-03-17 MED ORDER — WARFARIN - PHARMACIST DOSING INPATIENT
Freq: Every day | Status: DC
  Administered 2016-03-20: 1
  Administered 2016-03-21 – 2016-03-22 (×2)

## 2016-03-17 NOTE — Consult Note (Signed)
Consultation Note Date: 03/17/2016   Patient Name: [name]  DOB: 1951/04/11  MRN: [id number]  Age / Sex: 65 y.o., male  PCP: [name], MD Referring Physician: Renae Fickle, MD  Reason for Consultation: Establishing goals of care and Psychosocial/spiritual support  HPI/Patient Profile:   65 y.o. male   03/14/2016 with  PMH  that includes type 2 diabetes, uncontrolled, chronic kidney disease stage III, morbid obesity, chronic lower extremity edema, hypertension, chronic systolic heart failure, obstructive sleep apnea, atrial fibrillation, noncompliant with his Coumadin as well as his other anti-arrhythmics.  Admitted thru  emergency room with worsening lower extremity edema for the last month.  Pt states lives in home in home with two housemates, poor psycho-social support.  Non-compliance noted    He has significant recent hospitalization in February 2017 with atrial fibrillation RVR had transesophageal echocardiogram on 2/27 down to have a LA thrombus and DD CV cardioversion was canceled. His echocardiogram on 227 showed ejection fraction 20-25% with diffuse hypokinesis.  Seen by psychiatry this admission due to the fact that patient specks to his desire for euthanasia. He has history of depression since 2004 when he was separated from his wife and was admitted to [facility] several times prior to 2010.  Patient has intact cognition, memory, language functions and also able to understand his medical condition and treatment needs. Patient also understands she does not take care of his medical needs his medical condition may get worse.  He is deemed to have capacity at this time.   Clinical Assessment and Goals of Care:    This NP Lorinda Creed reviewed medical records, received report from team, assessed the patient and then meet with "[name]" at his bedsdie to discuss diagnosis, prognosis, GOC, EOL  wishes disposition and options.  We had a long conversation regarding euthanasia, and physician assisted suicide.  [name] does not want to take his own life, and has no suicide plan or  SI.  But he does speak clearly to not wanting to prolong the inevitable.  We discussed his choice to follow recommended medical treatment plan or not, but also the consequences of his choices.  We discussed hospice benefit in detail   A  discussion was had today regarding advanced directives.  Concepts specific to code status, artifical feeding and hydration, continued IV antibiotics, continuing cardiac treatments  and rehospitalization was had.  The difference between a aggressive medical intervention path  and a palliative comfort care path for this patient at this time was had.  Values and goals of care important to patient  were attempted to be elicited.   Natural trajectory and expectations at EOL were discussed.  Questions and concerns addressed.  Patient is  encouraged to call with questions or concerns.  PMT will continue to support holistically.      SUMMARY OF RECOMMENDATIONS   Code Status/Advance Care Planning:  DNR/DNI- documented today.  Patient is strongly encouraged to documented a HPOA and his advanced directives for anticipated future concerns  Palliative Prophylaxis:  Bowel Regimen, Delirium Protocol, Frequent Pain Assessment and Oral Care  Psycho-social/Spiritual:    Additional Recommendations: Education on Hospice  Prognosis:   Unable to determine, dependent on desire for life prolonging interventions   Discharge Planning:  Home with [facility] vs hospice, he is worried that his house is not in condition for people to come in     Primary Diagnoses: Present on Admission:  . Atrial fibrillation with rapid ventricular response (HCC) . CKD (chronic kidney disease), stage III . Swelling of lower extremity . Left atrial thrombus (HCC) . Elevated troponin . HTN (hypertension) .  Depression . Acute on chronic systolic and diastolic heart failure, NYHA class 3 (HCC) . Type 2 diabetes, uncontrolled, with renal manifestation (HCC)  I have reviewed the medical record, interviewed the patient and family, and examined the patient. The following aspects are pertinent.  Past Medical History  Diagnosis Date  . A-fib (HCC)     a. (06/04/14) TEE-DC-CV; succesful; large LA 6.2 cm  . Depression   . Type 2 diabetes mellitus (HCC)   . CKD (chronic kidney disease) stage 3, GFR 30-59 ml/min   . OSA (obstructive sleep apnea)   . Chronic systolic heart failure (HCC)     a. ECHO (05/2014): EF 25-30%, diff HK, RV midly dilated and sys fx mildly/mod reduced  . HTN (hypertension)    Social History   Social History  . Marital Status: Divorced    Spouse Name: N/A  . Number of Children: N/A  . Years of Education: N/A   Social History Main Topics  . Smoking status: Former Smoker    Types: Cigars  . Smokeless tobacco: None     Comment: Smoked when he was in his teens  . Alcohol Use: No  . Drug Use: No  . Sexual Activity: Not Asked   Other Topics Concern  . None   Social History Narrative   Lives in [place] by himself. Retired from [employer] for [employer].    Family History  Problem Relation Age of Onset  . Heart attack Mother     deceased  . Diabetes Mother    Scheduled Meds: . ARIPiprazole  2 mg Oral BID  . atorvastatin  40 mg Oral q1800  . digoxin  0.125 mg Oral Daily  . enoxaparin (LOVENOX) injection  1 mg/kg Subcutaneous Q12H  . escitalopram  5 mg Oral Daily  . furosemide  40 mg Intravenous BID  . metoprolol tartrate  25 mg Oral Q6H  . sodium chloride flush  3 mL Intravenous Q12H   Continuous Infusions:  PRN Meds:.sodium chloride, acetaminophen, ondansetron (ZOFRAN) IV, sodium chloride flush Medications Prior to Admission:  Prior to Admission medications   Medication Sig Start Date End Date Taking? Authorizing Provider  digoxin  (LANOXIN) 0.125 MG tablet Take 1 tablet (0.125 mg total) by mouth daily. Patient not taking: Reported on 03/14/2016 01/10/16   Belkys A Regalado, MD  furosemide (LASIX) 80 MG tablet Take 1 tablet (80 mg total) by mouth 2 (two) times daily. Patient not taking: Reported on 03/14/2016 01/10/16   Belkys A Regalado, MD  losartan (COZAAR) 25 MG tablet Take 1 tablet (25 mg total) by mouth daily. Patient not taking: Reported on 03/14/2016 01/10/16   Belkys A Regalado, MD  metoprolol succinate (TOPROL-XL) 100 MG 24 hr tablet Take 1 tablet (100 mg total) by mouth 2 (two) times daily. Take with or immediately following a meal. Patient not taking: Reported on 03/14/2016 01/10/16   [facility]  A Regalado, MD  warfarin (COUMADIN) 6 MG tablet Take 1 tablet (6 mg total) by mouth daily. Patient not taking: Reported on 03/14/2016 01/10/16   Belkys A Regalado, MD   No Known Allergies Review of Systems  Constitutional: Positive for activity change and fatigue.  Cardiovascular: Positive for leg swelling.  Psychiatric/Behavioral: Negative for suicidal ideas.    Physical Exam  Constitutional: He appears well-developed. He appears ill.  Cardiovascular: An irregular rhythm present.  BLE +2 edema  Pulmonary/Chest: Effort normal and breath sounds normal.  Neurological: He is alert.  Skin:  BLE skin changes 2/2 CVD  Psychiatric: Cognition and memory are normal. He expresses no homicidal and no suicidal ideation.    Vital Signs: BP 121/83 mmHg  Pulse 97  Temp(Src) 98.3 F (36.8 C) (Oral)  Resp 16  Ht  (1.727 m)  Wt 107.6 kg (237 lb 3.4 oz)  BMI 36.08 kg/m2  SpO2 96% Pain Assessment: 0-10 POSS *See Group Information*: 1-Acceptable,Awake and alert Pain Score: Asleep   SpO2: SpO2: 96 % O2 Device:SpO2: 96 % O2 Flow Rate: .O2 Flow Rate (L/min): 0 L/min  IO: Intake/output summary:  Intake/Output Summary (Last 24 hours) at 03/17/16 1545 Last data filed at 03/17/16 1131  Gross per 24 hour  Intake    120 ml  Output   2100  ml  Net  -1980 ml    LBM: Last BM Date:  (pta) Baseline Weight: Weight: 108.041 kg (238 lb 3 oz) Most recent weight: Weight: 107.6 kg (237 lb 3.4 oz)      Palliative Assessment/Data:  60 %     Flowsheet Rows        Most Recent Value   Intake Tab    Referral Department  Hospitalist   Unit at Time of Referral  ICU   Palliative Care Primary Diagnosis  Cardiac   Date Notified  03/15/16   Palliative Care Type  New Palliative care   Reason for referral  Clarify Goals of Care   Date of Admission  03/14/16   # of days IP prior to Palliative referral  1   Clinical Assessment    Psychosocial & Spiritual Assessment    Palliative Care Outcomes      Discussed with Dr Malachi Bonds yesterday and Dr Cena Benton today   Time In: 0715 Time Out: 0915 Time Total: 120  min Greater than 50%  of this time was spent counseling and coordinating care related to the above assessment and plan.  Signed by: Lorinda Creed, NP   Please contact Palliative Medicine Team phone at [phone] for questions and concerns.  For individual provider: See Loretha Stapler

## 2016-03-17 NOTE — Progress Notes (Addendum)
PROGRESS NOTE  [name]  VOH:[id number] DOB: September 07, 1951 DOA: 03/14/2016 PCP: [name], MD Outpatient Specialists:  Dr. Tresa Endo, Cardiology From home alone  Brief Narrative:   [name] is a 65 y.o. male with history of uncontrolled type 2 diabetes, chronic kidney disease stage III, morbid obesity, chronic lower extremity edema, hypertension, chronic systolic heart failure EF 20-25%, obstructive sleep apnea, atrial fibrillation, noncompliant with his coumadin as well as his other anti-arrhythmics presented to the emergency room with worsening lower extremity edema and abdominal bloating.  He denied chest pain or palpitations.He was hospitalized in February 2017 with atrial fibrillation with RVR and had transesophageal echocardiogram on 2/27.  He was found to have a LA thrombus.  His DCCV cardioversion was canceled.  History was difficult to obtain, numerous tangental thought processes.   Since admission, his A. fib with RVR has resolved. His echocardiogram demonstrates persistent apical thrombus.  He has been diuresing well on Lasix 40 mg IV twice a day.  Unfortunately, he frankly admits that he will be noncompliant with his medications at home. He has been seen by psychiatry who has deemed him able to make his own medical decisions. I have consulted palliative care because of the patient's ongoing passive suicidal ideation and goal of comfort.  He may be a good candidate for hospice care given his recurrent admissions for heart failure and A. fib with RVR.   Assessment & Plan:   Principal Problem:   Noncompliance with medication regimen Active Problems:   Swelling of lower extremity   Depression   Type 2 diabetes, uncontrolled, with renal manifestation (HCC)   CKD (chronic kidney disease), stage III   HTN (hypertension)   Elevated troponin   Acute on chronic systolic and diastolic heart failure, NYHA class 3 (HCC)   Left atrial thrombus (HCC)   Atrial fibrillation  with rapid ventricular response (HCC)   Atrial fibrillation with RVR, CHAD2s- Vasc score 4 (htn, dm, age, chf).  Diltiazem contraindicated in the setting of systolic heart failure.  HR about 100bpm -  Continue metoprolol 25 mg every 6 hours, hold for systolic blood pressure less than 110 while diuresing but plan to resume once daily dosing closer to discharge -  Continue digoxin -  If still tachycardic, consider loading with digoxin -  Continue Lovenox therapeutic dose and resume coumadin this evening per Pharmacy  Acute on chronic systolic congestive heart failure, biventricular failure, echo 12/30/15 ef 20-25%, currently 15-20% with blunted IVC.  Chest x-ray was clear.  -  Continue BB -  Hold ACEI due to CKD and possibility of hypotension -  Continue lasix 40mg  IV BID -  Wt stable, -2.15L  Left  Ventricular 1.9 x 2.1cm apical thrombus that is partially mobile -  Anticoagulation with lovenox to bridge to coumadin, if patient amenable -  Monitor for strokes  Elevated troponins, chest pain free in setting of CKD, a-fib with RVR, and heart failure.  Possible CAD. -  Due to medication noncompliance and CKD, not a good candidate for heart catheterization so will medically manage -  Resumed aspirin, BB, and high dose statin upon admission  Depression/psychosocial issues/noncompliance - off all meds x 1 month.  States he has anxiety that prevents him from leaving his house and taking his medications.  Reiterated that he is a "65 year old stuck in a 66 year old body."  States that he wants to die and wants to petition the [employer] to allow him to be euthanized.  No active plan to kill himself. -  Psychiatry assistance appreciated:  Patient HAS CAPACITY TO MAKE HIS MEDICAL CONDITIONS -  Resume lexapro and abilify -  SW and CM.  Unlikely that he could afford ALF.  Needs maximum home health services vs. Hospice care  Diabetes mellitus, type II, diet controlled CBG stable - Hemoglobin A1c 6 -   Continue SSI  Acute on CKD stage 3, likely due to cardiorenal syndrome -  Diuresis as above  Essential hypertension, blood pressure low normal - Continue Lasix, metoprolol  DVT prophylaxis:  Therapeutic lovenox Code Status:  Full code  Family Communication:  Patient alone, discussed test results and plan, questions answered Disposition Plan:   Palliative care consult to clarify patient's goals of care.  I suspect he will not qualify for SNF.  He cannot afford ALF and HH will not provide enough services to keep him out of hospital.  Recommended hospice care if he qualifies given his readmissions, goals of care, and EF of 15-20%.     Consultants:   Psychiatry  Palliative care  Procedures:  ECHO  Antimicrobials:   none    Subjective: He reiterates that he will not take his heart medications when he leaves the hospital because they make him feel "high."  Denies chest pain.  Shortness of breath and swelling are somewhat improved today.    Objective: Filed Vitals:   03/17/16 0503 03/17/16 0802 03/17/16 1124 03/17/16 1202  BP:  127/95 121/83 121/83  Pulse:   102 97  Temp: 97.1 F (36.2 C) 97.4 F (36.3 C) 98.3 F (36.8 C)   TempSrc: Oral Oral Oral   Resp:  18 16   Height:      Weight:      SpO2:  95% 96%     Intake/Output Summary (Last 24 hours) at 03/17/16 1627 Last data filed at 03/17/16 1500  Gross per 24 hour  Intake    440 ml  Output   3100 ml  Net  -2660 ml   Filed Weights   03/15/16 0400 03/16/16 0400 03/17/16 0500  Weight: 107.9 kg (237 lb 14 oz) 108.6 kg (239 lb 6.7 oz) 107.6 kg (237 lb 3.4 oz)    Examination:  General exam:  Adult male.  NAD. Difficult historian HEENT:  NCAT, MMM Respiratory system:  Wheezes bilaterally. No rales or rhonchi.  Cardiovascular system: IRRR, tachycardic. Warm extremities Gastrointestinal system:  Normal active bowel sounds, distended, soft and nontender.  Skin: No rashes, lesions or ulcers MSK:  Normal tone and bulk,  2+ pitting bilateral lower extremity edema with stasis dermatitis Neuro:  Grossly intact Psychiatry:  Poor insight.    Data Reviewed: I have personally reviewed following labs and imaging studies  CBC:  Recent Labs Lab 03/14/16 1439 03/15/16 0525 03/16/16 0439 03/17/16 0218  WBC 7.0 7.2 8.5 6.4  NEUTROABS 5.0  --   --   --   HGB 12.7* 12.2* 12.7* 12.0*  HCT 41.5 39.7 41.3 39.7  MCV 89.4 88.8 91.4 89.6  PLT 191 168 192 156   Basic Metabolic Panel:  Recent Labs Lab 03/14/16 1351 03/15/16 0525 03/16/16 0439 03/17/16 0218  NA 135 134* 134* 137  K 4.5 4.0 4.0 3.9  CL 104 104 104 102  CO2 18* 19* 19* 24  GLUCOSE 146* 124* 134* 92  BUN 38* 37* 37* 33*  CREATININE 2.01* 1.73* 1.62* 1.68*  CALCIUM 9.0 8.5* 8.3* 8.2*   GFR: Estimated Creatinine Clearance: 52.1 mL/min (by C-G formula based  on Cr of 1.68). Liver Function Tests: No results for input(s): AST, ALT, ALKPHOS, BILITOT, PROT, ALBUMIN in the last 168 hours. No results for input(s): LIPASE, AMYLASE in the last 168 hours. No results for input(s): AMMONIA in the last 168 hours. Coagulation Profile:  Recent Labs Lab 03/14/16 1439  INR 1.57*   Cardiac Enzymes:  Recent Labs Lab 03/14/16 2335 03/15/16 0525  TROPONINI 0.42* 0.55*   BNP (last 3 results) No results for input(s): PROBNP in the last 8760 hours. HbA1C:  Recent Labs  03/14/16 2325  HGBA1C 6.5*   CBG: No results for input(s): GLUCAP in the last 168 hours. Lipid Profile:  Recent Labs  03/15/16 0525  CHOL 106  HDL 16*  LDLCALC 75  TRIG 77  CHOLHDL 6.6   Thyroid Function Tests:  Recent Labs  03/15/16 0949  TSH 3.565   Anemia Panel: No results for input(s): VITAMINB12, FOLATE, FERRITIN, TIBC, IRON, RETICCTPCT in the last 72 hours. Urine analysis:    Component Value Date/Time   COLORURINE YELLOW 05/09/2014 0138   APPEARANCEUR CLEAR 05/09/2014 0138   LABSPEC 1.023 05/09/2014 0138   PHURINE 5.0 05/09/2014 0138   GLUCOSEU  NEGATIVE 05/09/2014 0138   HGBUR TRACE* 05/09/2014 0138   BILIRUBINUR NEGATIVE 05/09/2014 0138   KETONESUR 15* 05/09/2014 0138   PROTEINUR 30* 05/09/2014 0138   UROBILINOGEN 0.2 05/09/2014 0138   NITRITE NEGATIVE 05/09/2014 0138   LEUKOCYTESUR NEGATIVE 05/09/2014 0138   Sepsis Labs: (procalcitonin:4,lacticidven:4)  ) Recent Results (from the past 240 hour(s))  MRSA PCR Screening     Status: None   Collection Time: 03/14/16  8:30 PM  Result Value Ref Range Status   MRSA by PCR NEGATIVE NEGATIVE Final    Comment:        The GeneXpert MRSA Assay (FDA approved for NASAL specimens only), is one component of a comprehensive MRSA colonization surveillance program. It is not intended to diagnose MRSA infection nor to guide or monitor treatment for MRSA infections.       Radiology Studies: No results found.   Scheduled Meds: . ARIPiprazole  2 mg Oral BID  . atorvastatin  40 mg Oral q1800  . digoxin  0.125 mg Oral Daily  . enoxaparin (LOVENOX) injection  1 mg/kg Subcutaneous Q12H  . escitalopram  5 mg Oral Daily  . furosemide  40 mg Intravenous BID  . metoprolol tartrate  25 mg Oral Q6H  . sodium chloride flush  3 mL Intravenous Q12H  . warfarin  7.5 mg Oral ONCE-1800  . Warfarin - Pharmacist Dosing Inpatient   Does not apply q1800   Continuous Infusions:     LOS: 3 days    Time spent: 30 min    Renae Fickle, MD [facility] Pager [phone]  If 7PM-7AM, please contact night-coverage [email or web address] Password [email or web address] 03/17/2016, 4:27 PM

## 2016-03-17 NOTE — Care Management Important Message (Signed)
Important Message  Patient Details  Name: [name] MRN: [id number] Date of Birth: 15-Oct-1951   Medicare Important Message Given:  Yes    Hanley Hays, RN 03/17/2016, 9:53 AM

## 2016-03-17 NOTE — Progress Notes (Addendum)
ANTICOAGULATION CONSULT NOTE - Initial Consult  Pharmacy Consult for heparin Indication: atrial fibrillation  No Known Allergies  Patient Measurements: Height: 5\' 8"  (172.7 cm) Weight: 237 lb 3.4 oz (107.6 kg) IBW/kg (Calculated) : 68.4  Vital Signs: Temp: 97.4 F (36.3 C) (05/09 0802) Temp Source: Oral (05/09 0802) BP: 127/95 mmHg (05/09 0802)  Labs:  Recent Labs  03/14/16 1439 03/14/16 2335 03/15/16 0525 03/16/16 0439 03/17/16 0218  HGB 12.7*  --  12.2* 12.7* 12.0*  HCT 41.5  --  39.7 41.3 39.7  PLT 191  --  168 192 156  LABPROT 18.8*  --   --   --   --   INR 1.57*  --   --   --   --   CREATININE  --   --  1.73* 1.62* 1.68*  TROPONINI  --  0.42* 0.55*  --   --     Estimated Creatinine Clearance: 52.1 mL/min (by C-G formula based on Cr of 1.68).   Medical History: Past Medical History  Diagnosis Date  . A-fib (HCC)     a. (06/04/14) TEE-DC-CV; succesful; large LA 6.2 cm  . Depression   . Type 2 diabetes mellitus (HCC)   . CKD (chronic kidney disease) stage 3, GFR 30-59 ml/min   . OSA (obstructive sleep apnea)   . Chronic systolic heart failure (HCC)     a. ECHO (05/2014): EF 25-30%, diff HK, RV midly dilated and sys fx mildly/mod reduced  . HTN (hypertension)     Assessment: 64 yom with SOB. PMH of afib on warfarin pta (noncompliant, ran out of meds). INR subtherapeutic. Pharmacy consulted to start lovenox. INR 1.57 on admit. Hg 12, plt wnl. No bleed documented. Weight 108kg, CrCl~52 ml/min. Partially mobile apical thrombus noted on 5/7 echo.  Goal of Therapy:  INR 2-3 Monitor platelets by anticoagulation protocol: Yes   Plan:  Lovenox 110mg  (~1mg /kg) [place] q12h Monitor CBC q72h, s/sx bleeding Follow up plans to transitions to oral anticoagulation   Greggory Stallion, PharmD Clinical Pharmacy Resident Pager # [phone] 03/17/2016 10:31 AM   ADDENDUM  Beginning bridge back to warfarin tonight for treatment of apical thrombus. History of noncompliance is an  issue but use of a DOAC is not indicated for treatment of apical thrombus, therefore warfarin is the best option. Pt remains stable with a Hgb of 12 and plts wnl, no bleeding documented.   INR goal 2-3  Plan: Continue Lovenox 110 mg SQ q12h Warfarin 7.5 mg PO x 1 Follow up daily INR Monitor for s/sx bleeding  Greggory Stallion, PharmD Clinical Pharmacy Resident Pager # [phone] 03/17/2016 4:25 PM

## 2016-03-17 NOTE — Progress Notes (Signed)
Patient transferring to 2W08. Report called. Belongings at bedside taken with patient. Transferred on telemetry.

## 2016-03-17 NOTE — Progress Notes (Signed)
PT Cancellation Note  Patient Details Name: [name] MRN: [id number] DOB: [birth year]   Cancelled Treatment:    Reason Eval/Treat Not Completed: Medical issues which prohibited therapy (per most recent Pharmacy note, INR subtherapeutic).  Will hold PT today and will attempt to see pt once more medically appropriate.   Encarnacion Chu PT, DPT  Pager: [phone] Phone: [phone] 03/17/2016, 11:38 AM

## 2016-03-18 DIAGNOSIS — Z515 Encounter for palliative care: Secondary | ICD-10-CM

## 2016-03-18 DIAGNOSIS — Z66 Do not resuscitate: Secondary | ICD-10-CM

## 2016-03-18 LAB — CBC
HEMATOCRIT: 39.2 % (ref 39.0–52.0)
HEMOGLOBIN: 12 g/dL — AB (ref 13.0–17.0)
MCH: 27.5 pg (ref 26.0–34.0)
MCHC: 30.6 g/dL (ref 30.0–36.0)
MCV: 89.7 fL (ref 78.0–100.0)
Platelets: 149 10*3/uL — ABNORMAL LOW (ref 150–400)
RBC: 4.37 MIL/uL (ref 4.22–5.81)
RDW: 18.5 % — AB (ref 11.5–15.5)
WBC: 7.4 10*3/uL (ref 4.0–10.5)

## 2016-03-18 LAB — BASIC METABOLIC PANEL
ANION GAP: 13 (ref 5–15)
BUN: 30 mg/dL — AB (ref 6–20)
CO2: 24 mmol/L (ref 22–32)
Calcium: 8.3 mg/dL — ABNORMAL LOW (ref 8.9–10.3)
Chloride: 105 mmol/L (ref 101–111)
Creatinine, Ser: 1.46 mg/dL — ABNORMAL HIGH (ref 0.61–1.24)
GFR calc Af Amer: 56 mL/min — ABNORMAL LOW (ref >60)
GFR, EST NON AFRICAN AMERICAN: 49 mL/min — AB (ref >60)
Glucose, Bld: 150 mg/dL — ABNORMAL HIGH (ref 65–99)
POTASSIUM: 3.5 mmol/L (ref 3.5–5.1)
Sodium: 142 mmol/L (ref 135–145)

## 2016-03-18 LAB — GLUCOSE, CAPILLARY: Glucose-Capillary: 119 mg/dL — ABNORMAL HIGH (ref 65–99)

## 2016-03-18 LAB — PROTIME-INR
INR: 1.31 (ref 0.00–1.49)
Prothrombin Time: 16.4 seconds — ABNORMAL HIGH (ref 11.6–15.2)

## 2016-03-18 MED ORDER — WARFARIN SODIUM 7.5 MG PO TABS
7.5000 mg | ORAL_TABLET | Freq: Once | ORAL | Status: AC
  Administered 2016-03-18: 7.5 mg via ORAL
  Filled 2016-03-18: qty 1

## 2016-03-18 NOTE — Evaluation (Signed)
Physical Therapy Evaluation Patient Details Name: [name] MRN: [id number] DOB: [birth year] Today's Date: 03/18/2016   History of Present Illness  Pt admitted with worsening LE edema and abdominal bloating. PMH: DM, CKD, morbid obesity, LE edema, HTN, CHF 20-25%, afib, medical non compliance, depression.  Clinical Impression  Patient presents with mild balance deficits and overall deconditioning due to hospitalization and overall poor mental well being.  Feel he will benefit from skilled PT in the acute setting to allow d/c home with follow up HHPT/RN/aide and OT.    Follow Up Recommendations Home health PT    Equipment Recommendations  None recommended by PT    Recommendations for Other Services       Precautions / Restrictions Precautions Precautions: Fall Restrictions Weight Bearing Restrictions: No      Mobility  Bed Mobility Overal bed mobility: Modified Independent             General bed mobility comments: up in chair  Transfers Overall transfer level: Needs assistance Equipment used: None Transfers: Sit to/from Stand Sit to Stand: Supervision         General transfer comment: only for safety due to chair locks not holding well  Ambulation/Gait Ambulation/Gait assistance: Supervision Ambulation Distance (Feet): 200 Feet Assistive device: None Gait Pattern/deviations: Step-through pattern;WFL(Within Functional Limits)     General Gait Details: no LOB, S for safety   Stairs Stairs: Yes Stairs assistance: Supervision Stair Management: Two rails;Alternating pattern;Step to pattern;Forwards Number of Stairs: 10 General stair comments: minguard for safety, pt with self selected pattern, alternating most of way up and step to pattern most of the way down, using both rails for safety/support  Wheelchair Mobility    Modified Rankin (Stroke Patients Only)       Balance Overall balance assessment: Needs assistance Sitting-balance support:  Feet supported Sitting balance-Leahy Scale: Good       Standing balance-Leahy Scale: Good                               Pertinent Vitals/Pain Pain Assessment: Faces Faces Pain Scale: Hurts little more Pain Location: R hip Pain Descriptors / Indicators: Aching Pain Intervention(s): Monitored during session    Home Living Family/patient expects to be discharged to:: Private residence Living Arrangements: Non-relatives/Friends   Type of Home: House Home Access: Stairs to enter Entrance Stairs-Rails: Left Entrance Stairs-Number of Steps: 10 Home Layout: One level Home Equipment: None Additional Comments: lives in a home divided into 4 separate living areas, owned by a family member    Prior Function Level of Independence: Needs assistance      ADL's / Engineer, water Assistance Needed: doesn't cook, grocery shop or do laundry  Comments: tenent in forward room cooks for him in exchange for money     Hand Dominance   Dominant Hand: Right    Extremity/Trunk Assessment   Upper Extremity Assessment: Defer to OT evaluation       LUE Deficits / Details: amputated thumb   Lower Extremity Assessment: RLE deficits/detail;LLE deficits/detail RLE Deficits / Details: moves unhindered, but reports hip pain which limits him some; edema both legs R >L  LLE Deficits / Details: AROM WFL, strength grossly WFL, noted redness lower leg but no warmth, edematous, but smaller than R     Communication   Communication: No difficulties  Cognition Arousal/Alertness: Awake/alert Behavior During Therapy: Flat affect Overall Cognitive Status: No family/caregiver present to determine baseline cognitive functioning (seems to  know that his behaviors and diet are contributing to his health issues, but seems helpless/uninterested in doing anything about it)                      General Comments General comments (skin integrity, edema, etc.): Patient mentioned ALF, but discussed  the most dismal location as likely where he will end up.    Exercises        Assessment/Plan    PT Assessment Patient needs continued PT services  PT Diagnosis Generalized weakness   PT Problem List Decreased activity tolerance;Decreased balance;Decreased mobility  PT Treatment Interventions Gait training;Stair training;Functional mobility training;Therapeutic activities;Therapeutic exercise;Patient/family education   PT Goals (Current goals can be found in the Care Plan section) Acute Rehab PT Goals Patient Stated Goal: None stated PT Goal Formulation: With patient Time For Goal Achievement: 03/25/16 Potential to Achieve Goals: Fair    Frequency Min 3X/week   Barriers to discharge Decreased caregiver support      Co-evaluation               End of Session   Activity Tolerance: Patient tolerated treatment well Patient left: in chair;with call bell/phone within reach           Time: 1255-1319 PT Time Calculation (min) (ACUTE ONLY): 24 min   Charges:   PT Evaluation $PT Eval Moderate Complexity: 1 Procedure PT Treatments $Gait Training: 8-22 mins   PT G CodesElray [name] 04/15/2016, 2:06 PM  [name], PT [phone] April 15, 2016

## 2016-03-18 NOTE — Progress Notes (Signed)
PROGRESS NOTE  [name]  IAX:[id number] DOB: [birth year] DOA: 03/14/2016 PCP: [name], MD Outpatient Specialists:  Dr. Tresa Endo, Cardiology From home alone  Brief Narrative:   [name] is a 65 y.o. male with history of uncontrolled type 2 diabetes, chronic kidney disease stage III, morbid obesity, chronic lower extremity edema, hypertension, chronic systolic heart failure EF 20-25%, obstructive sleep apnea, atrial fibrillation, noncompliant with his coumadin as well as his other anti-arrhythmics presented to the emergency room with worsening lower extremity edema and abdominal bloating.  He denied chest pain or palpitations.He was hospitalized in February 2017 with atrial fibrillation with RVR and had transesophageal echocardiogram on 2/27.  He was found to have a LA thrombus.  His DCCV cardioversion was canceled.  History was difficult to obtain, numerous tangental thought processes.   Since admission, his A. fib with RVR has resolved. His echocardiogram demonstrates persistent apical thrombus.  He has been diuresing well on Lasix 40 mg IV twice a day.  Unfortunately, he frankly admits that he will be noncompliant with his medications at home. He has been seen by psychiatry who has deemed him able to make his own medical decisions. I have consulted palliative care because of the patient's ongoing passive suicidal ideation and goal of comfort.  He may be a good candidate for hospice care given his recurrent admissions for heart failure and A. fib with RVR.   Assessment & Plan:   Principal Problem:   Noncompliance with medication regimen Active Problems:   Swelling of lower extremity   Depression   Type 2 diabetes, uncontrolled, with renal manifestation (HCC)   CKD (chronic kidney disease), stage III   HTN (hypertension)   Elevated troponin   Acute on chronic systolic and diastolic heart failure, NYHA class 3 (HCC)   Left atrial thrombus (HCC)   Atrial fibrillation  with rapid ventricular response (HCC)   DNR (do not resuscitate)   Palliative care encounter   Atrial fibrillation with RVR, CHAD2s- Vasc score 4 (htn, dm, age, chf).  Diltiazem contraindicated in the setting of systolic heart failure.  HR about 100bpm -  Continue metoprolol 25 mg every 6 hours, hold for systolic blood pressure less than 110 while diuresing but plan to resume once daily dosing closer to discharge -  Continue digoxin -  Continue Lovenox therapeutic dose and resume coumadin this evening per Pharmacy  Acute on chronic systolic congestive heart failure, biventricular failure, echo 12/30/15 ef 20-25%, currently 15-20% with blunted IVC.  Chest x-ray was clear.  -  Continue BB -  Hold ACEI due to CKD and possibility of hypotension -  Continue lasix 40mg  IV BID -  Wt stable, -2.15L  Left  Ventricular 1.9 x 2.1cm apical thrombus that is partially mobile -  Anticoagulation with lovenox to bridge to coumadin, if patient amenable -  Monitor for strokes  Elevated troponins, chest pain free in setting of CKD, a-fib with RVR, and heart failure.  Possible CAD. -  Due to medication noncompliance and CKD, not a good candidate for heart catheterization so will medically manage -  Resumed aspirin, BB, and high dose statin upon admission  Depression/psychosocial issues/noncompliance - off all meds x 1 month.  States he has anxiety that prevents him from leaving his house and taking his medications.  Reiterated that he is a "65 year old stuck in a 66 year old body."  States that he wants to die and wants to petition the [employer] to allow him to be  euthanized.  No active plan to kill himself. -  Psychiatry assistance appreciated:  Patient HAS CAPACITY TO MAKE HIS MEDICAL CONDITIONS -  Resume lexapro and abilify -  SW and CM.  Unlikely that he could afford ALF.  Needs maximum home health services vs. Hospice care, most likely hospice if he so chooses. Will await further discussion with palliative  care team.  Diabetes mellitus, type II, diet controlled CBG stable - Hemoglobin A1c 6 -  Continue SSI  Acute on CKD stage 3, likely due to cardiorenal syndrome -  Diuresis as above  Essential hypertension, blood pressure low normal - Continue Lasix, metoprolol  DVT prophylaxis:  Therapeutic lovenox Code Status:  Full code  Family Communication:  Patient alone, discussed test results and plan, questions answered Disposition Plan:   Palliative care consult to clarify patient's goals of care.  I suspect he will not qualify for SNF.  He cannot afford ALF and HH will not provide enough services to keep him out of hospital.  Recommended hospice care if he qualifies given his readmissions, goals of care, and EF of 15-20%.     Consultants:   Psychiatry  Palliative care  Procedures:  ECHO  Antimicrobials:   none    Subjective: He has no new complaints reported to me today  Objective: Filed Vitals:   03/18/16 0845 03/18/16 1221 03/18/16 1400 03/18/16 1736  BP:  135/88 144/87 123/85  Pulse: 101 88 111 92  Temp:   98.2 F (36.8 C)   TempSrc:   Oral   Resp:   18   Height:      Weight:      SpO2:   99%     Intake/Output Summary (Last 24 hours) at 03/18/16 1839 Last data filed at 03/18/16 1700  Gross per 24 hour  Intake    840 ml  Output   3800 ml  Net  -2960 ml   Filed Weights   03/16/16 0400 03/17/16 0500 03/18/16 0551  Weight: 108.6 kg (239 lb 6.7 oz) 107.6 kg (237 lb 3.4 oz) 105.144 kg (231 lb 12.8 oz)    Examination:  General exam:  Adult male.  NAD. Alert and awake HEENT:  NCAT, MMM Respiratory system:  No wheezes, equal chest rise Cardiovascular system: IRRR, tachycardic. Warm extremities Gastrointestinal system:  Normal active bowel sounds, distended, soft and nontender.  Skin: No rashes, lesions or ulcers MSK:  Normal tone and bulk, 2+ pitting bilateral lower extremity edema with stasis dermatitis Neuro:  Grossly intact Psychiatry:  Poor insight.     Data Reviewed: I have personally reviewed following labs and imaging studies  CBC:  Recent Labs Lab 03/14/16 1439 03/15/16 0525 03/16/16 0439 03/17/16 0218 03/18/16 0408  WBC 7.0 7.2 8.5 6.4 7.4  NEUTROABS 5.0  --   --   --   --   HGB 12.7* 12.2* 12.7* 12.0* 12.0*  HCT 41.5 39.7 41.3 39.7 39.2  MCV 89.4 88.8 91.4 89.6 89.7  PLT 191 168 192 156 149*   Basic Metabolic Panel:  Recent Labs Lab 03/14/16 1351 03/15/16 0525 03/16/16 0439 03/17/16 0218 03/18/16 0408  NA 135 134* 134* 137 142  K 4.5 4.0 4.0 3.9 3.5  CL 104 104 104 102 105  CO2 18* 19* 19* 24 24  GLUCOSE 146* 124* 134* 92 150*  BUN 38* 37* 37* 33* 30*  CREATININE 2.01* 1.73* 1.62* 1.68* 1.46*  CALCIUM 9.0 8.5* 8.3* 8.2* 8.3*   GFR: Estimated Creatinine Clearance: 59.3 mL/min (by C-G  formula based on Cr of 1.46). Liver Function Tests: No results for input(s): AST, ALT, ALKPHOS, BILITOT, PROT, ALBUMIN in the last 168 hours. No results for input(s): LIPASE, AMYLASE in the last 168 hours. No results for input(s): AMMONIA in the last 168 hours. Coagulation Profile:  Recent Labs Lab 03/14/16 1439 03/18/16 0408  INR 1.57* 1.31   Cardiac Enzymes:  Recent Labs Lab 03/14/16 2335 03/15/16 0525  TROPONINI 0.42* 0.55*   BNP (last 3 results) No results for input(s): PROBNP in the last 8760 hours. HbA1C: No results for input(s): HGBA1C in the last 72 hours. CBG:  Recent Labs Lab 03/18/16 0257  GLUCAP 119*   Lipid Profile: No results for input(s): CHOL, HDL, LDLCALC, TRIG, CHOLHDL, LDLDIRECT in the last 72 hours. Thyroid Function Tests: No results for input(s): TSH, T4TOTAL, FREET4, T3FREE, THYROIDAB in the last 72 hours. Anemia Panel: No results for input(s): VITAMINB12, FOLATE, FERRITIN, TIBC, IRON, RETICCTPCT in the last 72 hours. Urine analysis:    Component Value Date/Time   COLORURINE YELLOW 05/09/2014 0138   APPEARANCEUR CLEAR 05/09/2014 0138   LABSPEC 1.023 05/09/2014 0138   PHURINE  5.0 05/09/2014 0138   GLUCOSEU NEGATIVE 05/09/2014 0138   HGBUR TRACE* 05/09/2014 0138   BILIRUBINUR NEGATIVE 05/09/2014 0138   KETONESUR 15* 05/09/2014 0138   PROTEINUR 30* 05/09/2014 0138   UROBILINOGEN 0.2 05/09/2014 0138   NITRITE NEGATIVE 05/09/2014 0138   LEUKOCYTESUR NEGATIVE 05/09/2014 0138   Sepsis Labs: @LABRCNTIP (procalcitonin:4,lacticidven:4)  ) Recent Results (from the past 240 hour(s))  MRSA PCR Screening     Status: None   Collection Time: 03/14/16  8:30 PM  Result Value Ref Range Status   MRSA by PCR NEGATIVE NEGATIVE Final    Comment:        The GeneXpert MRSA Assay (FDA approved for NASAL specimens only), is one component of a comprehensive MRSA colonization surveillance program. It is not intended to diagnose MRSA infection nor to guide or monitor treatment for MRSA infections.       Radiology Studies: No results found.   Scheduled Meds: . ARIPiprazole  2 mg Oral BID  . atorvastatin  40 mg Oral q1800  . digoxin  0.125 mg Oral Daily  . enoxaparin (LOVENOX) injection  1 mg/kg Subcutaneous Q12H  . escitalopram  5 mg Oral Daily  . furosemide  40 mg Intravenous BID  . metoprolol tartrate  25 mg Oral Q6H  . sodium chloride flush  3 mL Intravenous Q12H  . Warfarin - Pharmacist Dosing Inpatient   Does not apply q1800   Continuous Infusions:     LOS: 4 days    Time spent: 30 min    Penny Pia, MD [facility] Pager [phone]  If 7PM-7AM, please contact night-coverage [email or web address] Password [facility] 03/18/2016, 6:39 PM

## 2016-03-18 NOTE — Evaluation (Signed)
Occupational Therapy Evaluation Patient Details Name: [name] MRN: [id number] DOB: November 15, 1950 Today's Date: 03/18/2016    History of Present Illness Pt admitted with worsening LE edema and abdominal bloating. PMH: DM, CKD, morbid obesity, LE edema, HTN, CHF 20-25%, afib, medical non compliance, depression.   Clinical Impression   Pt was independent in self care prior to admission. Walked without a device. Pt reports avoiding socks due to inability to don and doff because of R hip pain. Pt with tangential conversation about the [place] economy. Offered pt adaptive equipment and DME options and discussed possibilities of more support at home as pt is struggling with IADL. Pt not amenable to any recommendations provided, instead replying, "What's the point?" Will follow acutely.    Follow Up Recommendations  Home health OT    Equipment Recommendations   (defer to [facility])    Recommendations for Other Services       Precautions / Restrictions Precautions Precautions: Fall Restrictions Weight Bearing Restrictions: No      Mobility Bed Mobility Overal bed mobility: Modified Independent                Transfers Overall transfer level: Needs assistance Equipment used: None Transfers: Sit to/from Stand Sit to Stand: Supervision              Balance Overall balance assessment: Needs assistance Sitting-balance support: Feet supported Sitting balance-Leahy Scale: Good       Standing balance-Leahy Scale: Fair                              ADL Overall ADL's : Needs assistance/impaired Eating/Feeding: Independent;Sitting   Grooming: Wash/dry hands;Supervision/safety;Standing   Upper Body Bathing: Set up;Sitting   Lower Body Bathing: Supervison/ safety;Sit to/from stand   Upper Body Dressing : Set up;Sitting   Lower Body Dressing: Minimal assistance;Sit to/from stand;Sitting/lateral leans Lower Body Dressing Details (indicate cue type  and reason): pt not able to manage socks, can don pants, reports difficulty, pt not interested in AE to assist him Toilet Transfer: Supervision/safety;Ambulation;Comfort height toilet           Functional mobility during ADLs: Supervision/safety General ADL Comments: Reports initial unsteadiness upon standing and baseline R hip pain.     Vision     Perception     Praxis      Pertinent Vitals/Pain Pain Assessment: Faces Faces Pain Scale: Hurts little more Pain Location: R hip Pain Descriptors / Indicators: Aching Pain Intervention(s): Repositioned;Monitored during session     Hand Dominance Right   Extremity/Trunk Assessment Upper Extremity Assessment Upper Extremity Assessment: LUE deficits/detail LUE Deficits / Details: amputated thumb LUE Coordination: decreased fine motor   Lower Extremity Assessment Lower Extremity Assessment: Defer to PT evaluation       Communication Communication Communication: No difficulties   Cognition Arousal/Alertness: Awake/alert Behavior During Therapy: Flat affect Overall Cognitive Status: No family/caregiver present to determine baseline cognitive functioning (pt with tangential conversation about the economy)                     General Comments       Exercises       Shoulder Instructions      Home Living Family/patient expects to be discharged to:: Private residence Living Arrangements: Non-relatives/Friends   Type of Home: House Home Access: Stairs to enter Secretary/administrator of Steps: 10 Entrance Stairs-Rails: Left Home Layout: One level     Bathroom Shower/Tub:  Tub/shower unit   Bathroom Toilet: Standard     Home Equipment: None   Additional Comments: lives in a home divided into 4 separate living areas, owned by a family member      Prior Functioning/Environment Level of Independence: Independent        Comments: pt avoids socks, wears sneakers that he leaves tied, difficulty getting R  foot in a shoe with swelling    OT Diagnosis: Generalized weakness;Acute pain   OT Problem List: Decreased strength;Decreased activity tolerance;Impaired balance (sitting and/or standing);Pain;Decreased knowledge of use of DME or AE;Impaired UE functional use;Decreased coordination   OT Treatment/Interventions: Self-care/ADL training;DME and/or AE instruction;Patient/family education    OT Goals(Current goals can be found in the care plan section) Acute Rehab OT Goals Patient Stated Goal: wants to stay in the hospital until edema is gone OT Goal Formulation: With patient Time For Goal Achievement: 03/25/16 Potential to Achieve Goals: Good ADL Goals Pt Will Perform Grooming: with modified independence;standing Pt Will Perform Lower Body Bathing: with modified independence;with adaptive equipment;sit to/from stand Pt Will Perform Lower Body Dressing: with modified independence;with adaptive equipment;sit to/from stand Pt Will Transfer to Toilet: with modified independence;ambulating;regular height toilet Pt Will Perform Toileting - Clothing Manipulation and hygiene: with modified independence;sitting/lateral leans Pt Will Perform Tub/Shower Transfer: Tub transfer;with modified independence;ambulating (determine need for shower seat)  OT Frequency: Min 2X/week   Barriers to D/C:            Co-evaluation              End of Session    Activity Tolerance: Patient tolerated treatment well Patient left: in chair;with call bell/phone within reach;with nursing/sitter in room (LEs elevated)   Time: 4944-9675 OT Time Calculation (min): 20 min Charges:  OT General Charges $OT Visit: 1 Procedure OT Evaluation $OT Eval Moderate Complexity: 1 Procedure G-Codes:    Evern Bio 03/18/2016, 12:56 PM  [phone]

## 2016-03-18 NOTE — Progress Notes (Signed)
ANTICOAGULATION CONSULT NOTE - Initial Consult  Pharmacy Consult for heparin Indication: atrial fibrillation  No Known Allergies  Patient Measurements: Height: 5\' 8"  (172.7 cm) Weight: 231 lb 12.8 oz (105.144 kg) IBW/kg (Calculated) : 68.4  Vital Signs: Temp: 98.5 F (36.9 C) (05/10 0306) Temp Source: Oral (05/10 0306) BP: 156/98 mmHg (05/10 0306) Pulse Rate: 98 (05/10 0306)  Labs:  Recent Labs  03/16/16 0439 03/17/16 0218 03/18/16 0408  HGB 12.7* 12.0* 12.0*  HCT 41.3 39.7 39.2  PLT 192 156 149*  LABPROT  --   --  16.4*  INR  --   --  1.31  CREATININE 1.62* 1.68* 1.46*    Estimated Creatinine Clearance: 59.3 mL/min (by C-G formula based on Cr of 1.46).   Medical History: Past Medical History  Diagnosis Date  . A-fib (HCC)     a. (06/04/14) TEE-DC-CV; succesful; large LA 6.2 cm  . Depression   . Type 2 diabetes mellitus (HCC)   . CKD (chronic kidney disease) stage 3, GFR 30-59 ml/min   . OSA (obstructive sleep apnea)   . Chronic systolic heart failure (HCC)     a. ECHO (05/2014): EF 25-30%, diff HK, RV midly dilated and sys fx mildly/mod reduced  . HTN (hypertension)     Assessment: 64 yom with SOB. PMH of afib on warfarin pta (noncompliant, ran out of meds). INR subtherapeutic on admit. Pharmacy consulted to start lovenox. Weight 105kg, CrCl>30. Partially mobile apical thrombus noted on 5/7 echo.  Bridge back to warfarin starting 5/9 for treatment of apical thrombus. History of noncompliance but use of a DOAC not indicated for apical thrombus. INR down 1.57>1.31 this AM. Hgb of 12 stable, plt stable 149, no bleeding documented.  Goal of Therapy:  INR 2-3 Monitor platelets by anticoagulation protocol: Yes   Plan:  Lovenox 110mg  (~1mg /kg) [place] q12h Warfarin 7.5mg  x 1 dose tonight Monitor daily INR, CBC q72h, s/sx bleeding   Babs Bertin, PharmD, [facility] Clinical Pharmacist Pager [phone] 03/18/2016 8:46 AM

## 2016-03-19 DIAGNOSIS — M7989 Other specified soft tissue disorders: Secondary | ICD-10-CM

## 2016-03-19 LAB — BASIC METABOLIC PANEL
Anion gap: 12 (ref 5–15)
BUN: 27 mg/dL — ABNORMAL HIGH (ref 6–20)
CALCIUM: 8.5 mg/dL — AB (ref 8.9–10.3)
CO2: 29 mmol/L (ref 22–32)
CREATININE: 1.44 mg/dL — AB (ref 0.61–1.24)
Chloride: 100 mmol/L — ABNORMAL LOW (ref 101–111)
GFR calc non Af Amer: 50 mL/min — ABNORMAL LOW (ref >60)
GFR, EST AFRICAN AMERICAN: 57 mL/min — AB (ref >60)
Glucose, Bld: 98 mg/dL (ref 65–99)
Potassium: 3.6 mmol/L (ref 3.5–5.1)
SODIUM: 141 mmol/L (ref 135–145)

## 2016-03-19 LAB — CBC
HCT: 39.9 % (ref 39.0–52.0)
Hemoglobin: 11.8 g/dL — ABNORMAL LOW (ref 13.0–17.0)
MCH: 26.6 pg (ref 26.0–34.0)
MCHC: 29.6 g/dL — ABNORMAL LOW (ref 30.0–36.0)
MCV: 90.1 fL (ref 78.0–100.0)
PLATELETS: 141 10*3/uL — AB (ref 150–400)
RBC: 4.43 MIL/uL (ref 4.22–5.81)
RDW: 18.5 % — AB (ref 11.5–15.5)
WBC: 6.2 10*3/uL (ref 4.0–10.5)

## 2016-03-19 LAB — PROTIME-INR
INR: 1.31 (ref 0.00–1.49)
PROTHROMBIN TIME: 16.4 s — AB (ref 11.6–15.2)

## 2016-03-19 MED ORDER — ENOXAPARIN SODIUM 100 MG/ML ~~LOC~~ SOLN
100.0000 mg | Freq: Two times a day (BID) | SUBCUTANEOUS | Status: DC
  Administered 2016-03-19 – 2016-03-23 (×8): 100 mg via SUBCUTANEOUS
  Filled 2016-03-19 (×8): qty 1

## 2016-03-19 MED ORDER — WARFARIN SODIUM 10 MG PO TABS
10.0000 mg | ORAL_TABLET | Freq: Once | ORAL | Status: AC
  Administered 2016-03-19: 10 mg via ORAL
  Filled 2016-03-19: qty 1

## 2016-03-19 NOTE — Progress Notes (Signed)
ANTICOAGULATION CONSULT NOTE - Follow Up Consult  Pharmacy Consult for Lovenox and Coumadin Indication: atrial fibrillation and LV thrombus  No Known Allergies  Patient Measurements: Height: 5\' 8"  (172.7 cm) Weight: 226 lb 12.8 oz (102.876 kg) IBW/kg (Calculated) : 68.4  Vital Signs: Temp: 98.2 F (36.8 C) (05/11 0511) Temp Source: Oral (05/11 0511) BP: 142/95 mmHg (05/11 0511) Pulse Rate: 93 (05/11 1022)  Labs:  Recent Labs  03/17/16 0218 03/18/16 0408 03/19/16 0344  HGB 12.0* 12.0* 11.8*  HCT 39.7 39.2 39.9  PLT 156 149* 141*  LABPROT  --  16.4* 16.4*  INR  --  1.31 1.31  CREATININE 1.68* 1.46* 1.44*    Estimated Creatinine Clearance: 59.5 mL/min (by C-G formula based on Cr of 1.44).  Assessment:   On Coumadin prior to admission but non-compliant.  Hx afib; partially mobile apical thrombus noted on echo 5/7.   On Lovenox bridge while INR is subtherapeutic.  INR only 1.31 after Coumadin 7.5 mg x 2 days.  Weight down from 108 to 102.9 kg.   Prior home Coumadin regimen: 6 mg daily.  INR 1.57 on admit 5/6.   Goal of Therapy:  INR 2-3 Anti-Xa level 0.6-1 units/ml 4hrs after LMWH dose given Monitor platelets by anticoagulation protocol: Yes   Plan:   Adjust Lovenox from 110 to 100 mg sq q12hrs for decreased weight.  Increase Coumadin to 10 mg x 1 today.  Daily PT/INR.  Dennie Fetters, RPh Pager: [phone] 03/19/2016,10:51 AM

## 2016-03-19 NOTE — Care Management Note (Signed)
Case Management Note Donn Pierini RN, BSN Unit 2W-Case Manager [phone]  Patient Details  Name: [name] MRN: [id number] Date of Birth: 21-Dec-1950  Subjective/Objective:    Pt admitted with HF/noncompliance with medication/depression                Action/Plan: PTA pt lived at home - referral received for home hospice  - went in to speak with pt at bedside- choice for home hospice discussed and list offered-pt states that he wants to use [facility]- spoke about any DME needs- pt states that he does not know of any- asked pt if he had a ride home- reports that he "might but that he took the bus home last time" - pt appears with a flat affect and is not very forth coming in speaking about d/c plans.   Referral has been given to Bambi with [facility] for home hospice needs- Bambi on unit to see pt in room. CM to continue to follow for d/c needs.   Expected Discharge Date:  03/20/16               Expected Discharge Plan:  Home w Hospice Care  In-House Referral:     Discharge planning Services  CM Consult  Post Acute Care Choice:  Hospice Choice offered to:  Patient  DME Arranged:    DME Agency:     HH Arranged:  Disease Management HH Agency:  Other - See comment  Status of Service:     Medicare Important Message Given:  Yes Date Medicare IM Given:    Medicare IM give by:    Date Additional Medicare IM Given:    Additional Medicare Important Message give by:     If discussed at Long Length of Stay Meetings, dates discussed:    Additional Comments:  [name], RN 03/19/2016, 10:59 AM

## 2016-03-19 NOTE — Progress Notes (Signed)
PROGRESS NOTE  [name]  ZOX:[id number] DOB: 1951-05-05 DOA: 03/14/2016 PCP: [name], MD Outpatient Specialists:  Dr. Tresa Endo, Cardiology From home alone  Brief Narrative:   [name] is a 65 y.o. male with history of uncontrolled type 2 diabetes, chronic kidney disease stage III, morbid obesity, chronic lower extremity edema, hypertension, chronic systolic heart failure EF 20-25%, obstructive sleep apnea, atrial fibrillation, noncompliant with his coumadin as well as his other anti-arrhythmics presented to the emergency room with worsening lower extremity edema and abdominal bloating.  He denied chest pain or palpitations.He was hospitalized in February 2017 with atrial fibrillation with RVR and had transesophageal echocardiogram on 2/27.  He was found to have a LA thrombus.  His DCCV cardioversion was canceled.  History was difficult to obtain, numerous tangental thought processes.   Since admission, his A. fib with RVR has resolved. His echocardiogram demonstrates persistent apical thrombus.  He has been diuresing well on Lasix 40 mg IV twice a day.  Unfortunately, he frankly admits that he will be noncompliant with his medications at home. He has been seen by psychiatry who has deemed him able to make his own medical decisions. I have consulted palliative care because of the patient's ongoing passive suicidal ideation and goal of comfort.  He may be a good candidate for hospice care given his recurrent admissions for heart failure and A. fib with RVR.   Assessment & Plan:   Principal Problem:   Noncompliance with medication regimen Active Problems:   Swelling of lower extremity   Depression   Type 2 diabetes, uncontrolled, with renal manifestation (HCC)   CKD (chronic kidney disease), stage III   HTN (hypertension)   Elevated troponin   Acute on chronic systolic and diastolic heart failure, NYHA class 3 (HCC)   Left atrial thrombus (HCC)   Atrial fibrillation  with rapid ventricular response (HCC)   DNR (do not resuscitate)   Palliative care encounter   Atrial fibrillation with RVR, CHAD2s- Vasc score 4 (htn, dm, age, chf).  Diltiazem contraindicated in the setting of systolic heart failure.  HR about 100bpm -  Continue metoprolol 25 mg every 6 hours, hold for systolic blood pressure less than 110 while diuresing but plan to resume once daily dosing closer to discharge -  Continue digoxin -  Continue Lovenox therapeutic dose and resume coumadin this evening per Pharmacy  Acute on chronic systolic congestive heart failure, biventricular failure, echo 12/30/15 ef 20-25%, currently 15-20% with blunted IVC.  Chest x-ray was clear.  -  Continue BB -  Hold ACEI due to CKD and possibility of hypotension -  Continue lasix  IV BID -  Wt stable, -8L    Left  Ventricular 1.9 x 2.1cm apical thrombus that is partially mobile -  Anticoagulation with lovenox to bridge to coumadin, if patient amenable -  Monitor for strokes  Elevated troponins, chest pain free in setting of CKD, a-fib with RVR, and heart failure.  Possible CAD. -  Due to medication noncompliance and CKD, not a good candidate for heart catheterization so will medically manage -  Resumed aspirin, BB, and high dose statin upon admission  Depression/psychosocial issues/noncompliance - off all meds x 1 month.  States he has anxiety that prevents him from leaving his house and taking his medications.  Reiterated that he is a "65 year old stuck in a 65 year old body."  States that he wants to die and wants to petition the [employer] to allow him  to be euthanized.  No active plan to kill himself. -  Psychiatry assistance appreciated:  Patient HAS CAPACITY TO MAKE HIS MEDICAL CONDITIONS -  Resume lexapro and abilify -  SW and CM.  Unlikely that he could afford ALF.  Needs maximum home health services vs. Hospice care  Diabetes mellitus, type II, diet controlled CBG stable - Hemoglobin A1c 6 -   Continue SSI  Acute on CKD stage 3, likely due to cardiorenal syndrome -  Diuresis as above  Essential hypertension, blood pressure low normal - Continue Lasix, metoprolol  DVT prophylaxis:  Therapeutic lovenox Code Status:  Full code  Family Communication:  Patient alone, discussed test results and plan, questions answered Disposition Plan:   Palliative care consult to clarify patient's goals of care.  I suspect he will not qualify for SNF.  He cannot afford ALF and HH will not provide enough services to keep him out of hospital.  Recommended hospice care if he qualifies given his readmissions, goals of care, and EF of 15-20%.     Consultants:   Psychiatry  Palliative care  Procedures:  ECHO  Antimicrobials:   none    Subjective: He has no new complaints reported to me today  Objective: Filed Vitals:   03/18/16 2132 03/19/16 0511 03/19/16 1022 03/19/16 1331  BP: 120/86 142/95  129/76  Pulse:  85 93 83  Temp: 98.2 F (36.8 C) 98.2 F (36.8 C)  98 F (36.7 C)  TempSrc: Oral Oral  Oral  Resp: 18 18  16   Height:      Weight:  102.876 kg (226 lb 12.8 oz)    SpO2: 96% 97%  98%    Intake/Output Summary (Last 24 hours) at 03/19/16 1814 Last data filed at 03/19/16 0900  Gross per 24 hour  Intake    360 ml  Output   2100 ml  Net  -1740 ml   Filed Weights   03/17/16 0500 03/18/16 0551 03/19/16 0511  Weight: 107.6 kg (237 lb 3.4 oz) 105.144 kg (231 lb 12.8 oz) 102.876 kg (226 lb 12.8 oz)    Examination:  General exam:  Adult male.  NAD. Alert and awake HEENT:  NCAT, MMM Respiratory system:  No wheezes, equal chest rise Cardiovascular system: IRRR, tachycardic. Warm extremities Gastrointestinal system:  Normal active bowel sounds, distended, soft and nontender.  Skin: No rashes, lesions or ulcers MSK:  Normal tone and bulk, 2+ pitting bilateral lower extremity edema with stasis dermatitis Neuro:  Grossly intact Psychiatry:  Poor insight.    Data Reviewed: I  have personally reviewed following labs and imaging studies  CBC:  Recent Labs Lab 03/14/16 1439 03/15/16 0525 03/16/16 0439 03/17/16 0218 03/18/16 0408 03/19/16 0344  WBC 7.0 7.2 8.5 6.4 7.4 6.2  NEUTROABS 5.0  --   --   --   --   --   HGB 12.7* 12.2* 12.7* 12.0* 12.0* 11.8*  HCT 41.5 39.7 41.3 39.7 39.2 39.9  MCV 89.4 88.8 91.4 89.6 89.7 90.1  PLT 191 168 192 156 149* 141*   Basic Metabolic Panel:  Recent Labs Lab 03/15/16 0525 03/16/16 0439 03/17/16 0218 03/18/16 0408 03/19/16 0344  NA 134* 134* 137 142 141  K 4.0 4.0 3.9 3.5 3.6  CL 104 104 102 105 100*  CO2 19* 19* 24 24 29   GLUCOSE 124* 134* 92 150* 98  BUN 37* 37* 33* 30* 27*  CREATININE 1.73* 1.62* 1.68* 1.46* 1.44*  CALCIUM 8.5* 8.3* 8.2* 8.3* 8.5*  GFR: Estimated Creatinine Clearance: 59.5 mL/min (by C-G formula based on Cr of 1.44). Liver Function Tests: No results for input(s): AST, ALT, ALKPHOS, BILITOT, PROT, ALBUMIN in the last 168 hours. No results for input(s): LIPASE, AMYLASE in the last 168 hours. No results for input(s): AMMONIA in the last 168 hours. Coagulation Profile:  Recent Labs Lab 03/14/16 1439 03/18/16 0408 03/19/16 0344  INR 1.57* 1.31 1.31   Cardiac Enzymes:  Recent Labs Lab 03/14/16 2335 03/15/16 0525  TROPONINI 0.42* 0.55*   BNP (last 3 results) No results for input(s): PROBNP in the last 8760 hours. HbA1C: No results for input(s): HGBA1C in the last 72 hours. CBG:  Recent Labs Lab 03/18/16 0257  GLUCAP 119*   Lipid Profile: No results for input(s): CHOL, HDL, LDLCALC, TRIG, CHOLHDL, LDLDIRECT in the last 72 hours. Thyroid Function Tests: No results for input(s): TSH, T4TOTAL, FREET4, T3FREE, THYROIDAB in the last 72 hours. Anemia Panel: No results for input(s): VITAMINB12, FOLATE, FERRITIN, TIBC, IRON, RETICCTPCT in the last 72 hours. Urine analysis:    Component Value Date/Time   COLORURINE YELLOW 05/09/2014 0138   APPEARANCEUR CLEAR 05/09/2014 0138     LABSPEC 1.023 05/09/2014 0138   PHURINE 5.0 05/09/2014 0138   GLUCOSEU NEGATIVE 05/09/2014 0138   HGBUR TRACE* 05/09/2014 0138   BILIRUBINUR NEGATIVE 05/09/2014 0138   KETONESUR 15* 05/09/2014 0138   PROTEINUR 30* 05/09/2014 0138   UROBILINOGEN 0.2 05/09/2014 0138   NITRITE NEGATIVE 05/09/2014 0138   LEUKOCYTESUR NEGATIVE 05/09/2014 0138   Sepsis Labs: @LABRCNTIP (procalcitonin:4,lacticidven:4)  ) Recent Results (from the past 240 hour(s))  MRSA PCR Screening     Status: None   Collection Time: 03/14/16  8:30 PM  Result Value Ref Range Status   MRSA by PCR NEGATIVE NEGATIVE Final    Comment:        The GeneXpert MRSA Assay (FDA approved for NASAL specimens only), is one component of a comprehensive MRSA colonization surveillance program. It is not intended to diagnose MRSA infection nor to guide or monitor treatment for MRSA infections.       Radiology Studies: No results found.   Scheduled Meds: . ARIPiprazole  2 mg Oral BID  . atorvastatin  40 mg Oral q1800  . digoxin  0.125 mg Oral Daily  . enoxaparin (LOVENOX) injection  100 mg Subcutaneous Q12H  . escitalopram  5 mg Oral Daily  . furosemide  40 mg Intravenous BID  . metoprolol tartrate  25 mg Oral Q6H  . sodium chloride flush  3 mL Intravenous Q12H  . Warfarin - Pharmacist Dosing Inpatient   Does not apply q1800   Continuous Infusions:     LOS: 5 days    Time spent: 30 min    Penny Pia, MD [facility] Pager [phone]  If 7PM-7AM, please contact night-coverage [email or web address] Password [facility] 03/19/2016, 6:14 PM

## 2016-03-19 NOTE — Discharge Instructions (Signed)

## 2016-03-19 NOTE — Progress Notes (Signed)
Daily Progress Note   Patient Name: [name]       Date: 03/19/2016 DOB: 1951/01/09  Age: 65 y.o. MRN#: [id number] Attending Physician: Penny Pia, MD Primary Care Physician: [name], MD Admit Date: 03/14/2016  Reason for Consultation/Follow-up: Establishing goals of care and Psychosocial/spiritual support  Subjective:  - Continued conversation regarding diagnosis, prognosis, GOC, disposition options and hospice benefit  - [name] continues with an overall gloomy outlook on life in general, denies SI and is in the mindset of let nature take its course  - we discussed hospice benefit and he is open to services    Length of Stay: 5  Current Medications: Scheduled Meds:  . ARIPiprazole  2 mg Oral BID  . atorvastatin  40 mg Oral q1800  . digoxin  0.125 mg Oral Daily  . enoxaparin (LOVENOX) injection  1 mg/kg Subcutaneous Q12H  . escitalopram  5 mg Oral Daily  . furosemide  40 mg Intravenous BID  . metoprolol tartrate  25 mg Oral Q6H  . sodium chloride flush  3 mL Intravenous Q12H  . Warfarin - Pharmacist Dosing Inpatient   Does not apply q1800    Continuous Infusions:    PRN Meds: sodium chloride, acetaminophen, ondansetron (ZOFRAN) IV, sodium chloride flush  Physical Exam  Constitutional: He appears well-developed. He appears ill.  Cardiovascular: Normal rate, regular rhythm and normal heart sounds.   Pulmonary/Chest: He has decreased breath sounds in the right lower field and the left lower field.  Skin: Skin is warm and dry.  - noted BLE skin changes 2/2 to CAD  Psychiatric: He is slowed. Cognition and memory are normal. He expresses inappropriate judgment. He expresses no suicidal ideation.            Vital Signs: BP 142/95 mmHg  Pulse 85  Temp(Src) 98.2  F (36.8 C) (Oral)  Resp 18  Ht 5\' 8"  (1.727 m)  Wt 102.876 kg (226 lb 12.8 oz)  BMI 34.49 kg/m2  SpO2 97% SpO2: SpO2: 97 % O2 Device: O2 Device: Not Delivered O2 Flow Rate: O2 Flow Rate (L/min): 0 L/min  Intake/output summary:  Intake/Output Summary (Last 24 hours) at 03/19/16 0847 Last data filed at 03/19/16 0612  Gross per 24 hour  Intake    840 ml  Output   4150 ml  Net  -  3310 ml   LBM: Last BM Date: 03/18/16 Baseline Weight: Weight: 108.041 kg (238 lb 3 oz) Most recent weight: Weight: 102.876 kg (226 lb 12.8 oz)       Palliative Assessment/Data: 50 % at best    Flowsheet Rows        Most Recent Value   Intake Tab    Referral Department  Hospitalist   Unit at Time of Referral  ICU   Palliative Care Primary Diagnosis  Cardiac   Date Notified  03/15/16   Palliative Care Type  New Palliative care   Reason for referral  Clarify Goals of Care   Date of Admission  03/14/16   # of days IP prior to Palliative referral  1   Clinical Assessment    Psychosocial & Spiritual Assessment    Palliative Care Outcomes       Patient Active Problem List   Diagnosis Date Noted  . DNR (do not resuscitate) 03/18/2016  . Palliative care encounter 03/18/2016  . Atrial fibrillation with rapid ventricular response (HCC) 03/14/2016  . Noncompliance with medication regimen 03/14/2016  . Left atrial thrombus (HCC) 01/16/2016  . Hypertensive heart disease with heart failure (HCC)   . Acute on chronic systolic and diastolic heart failure, NYHA class 3 (HCC)   . Elevated troponin   . Atrial flutter (HCC) 12/28/2015  . Encounter for therapeutic drug monitoring 05/24/2014  . HTN (hypertension) 05/21/2014  . Chronic systolic heart failure (HCC) 05/15/2014  . Obstructive sleep apnea 05/13/2014  . AKI (acute kidney injury) (HCC) 05/09/2014  . Swelling of lower extremity 05/09/2014  . Depression 05/09/2014  . Type 2 diabetes, uncontrolled, with renal manifestation (HCC) 05/09/2014  . CKD  (chronic kidney disease), stage III 05/09/2014  . Obesity (BMI 30-39.9) 05/09/2014  . Ascites 05/09/2014    Palliative Care Assessment & Plan   Patient Profile: 65 y.o. male 03/14/2016 with PMH that includes type 2 diabetes, uncontrolled, chronic kidney disease stage III, morbid obesity, chronic lower extremity edema, hypertension, chronic systolic heart failure, obstructive sleep apnea, atrial fibrillation, noncompliant with his Coumadin as well as his other anti-arrhythmics. Admitted thru emergency room with worsening lower extremity edema for the last month.  Pt states lives in home in home with two housemates, poor psycho-social support. Non-compliance noted   He has significant recent hospitalization in February 2017 with atrial fibrillation RVR had transesophageal echocardiogram on 2/27 down to have a LA thrombus and DD CV cardioversion was canceled. His echocardiogram on 227 showed ejection fraction 20-25% with diffuse hypokinesis.  Seen by psychiatry this admission due to the fact that patient specks to his desire for euthanasia. He has history of depression since 2004 when he was separated from his wife and was admitted to [facility] several times prior to 2010. Patient has intact cognition, memory, language functions and also able to understand his medical condition and treatment needs. Patient also understands she does not take care of his medical needs his medical condition may get worse.  He is deemed to have capacity at this time. -  Assessment: - stabilized and ready for discharge, patient is open to hospice beenfit   Goals of Care and Additional Recommendations:  Limitations on Scope of Treatment: Avoid Hospitalization, Minimize Medications and No Artificial Feeding  Code Status:    Code Status Orders        Start     Ordered   03/18/16 1117  Do not attempt resuscitation (DNR)   Continuous    Question Answer  Comment  In the event of cardiac or respiratory ARREST  Do not call a "code blue"   In the event of cardiac or respiratory ARREST Do not perform Intubation, CPR, defibrillation or ACLS   In the event of cardiac or respiratory ARREST Use medication by any route, position, wound care, and other measures to relive pain and suffering. May use oxygen, suction and manual treatment of airway obstruction as needed for comfort.      03/18/16 1117    Code Status History    Date Active Date Inactive Code Status Order ID Comments User Context   03/14/2016  5:13 PM 03/18/2016 11:17 AM Full Code [id number]  Pete Glatter, MD ED   12/28/2015  5:01 PM 01/10/2016  7:39 PM Full Code [id number]  Gwenyth Bender, NP ED   05/09/2014  1:07 AM 05/16/2014  5:16 PM Full Code [id number]  Doree Albee, MD ED       Prognosis:  Less than six months  Discharge Planning:  Home with Hospice  Care plan was discussed with Dr Cena Benton  Thank you for allowing the Palliative Medicine Team to assist in the care of this patient.   Time In: 0800 Time Out: 0835 Total Time 35 min Prolonged Time Billed  no       Greater than 50%  of this time was spent counseling and coordinating care related to the above assessment and plan.  Lorinda Creed, NP  Please contact Palliative Medicine Team phone at [phone] for questions and concerns.

## 2016-03-20 LAB — CBC
HEMATOCRIT: 37.6 % — AB (ref 39.0–52.0)
HEMOGLOBIN: 11.6 g/dL — AB (ref 13.0–17.0)
MCH: 28.4 pg (ref 26.0–34.0)
MCHC: 30.9 g/dL (ref 30.0–36.0)
MCV: 91.9 fL (ref 78.0–100.0)
Platelets: 139 10*3/uL — ABNORMAL LOW (ref 150–400)
RBC: 4.09 MIL/uL — AB (ref 4.22–5.81)
RDW: 18.3 % — ABNORMAL HIGH (ref 11.5–15.5)
WBC: 5.1 10*3/uL (ref 4.0–10.5)

## 2016-03-20 LAB — PROTIME-INR
INR: 1.41 (ref 0.00–1.49)
Prothrombin Time: 17.3 seconds — ABNORMAL HIGH (ref 11.6–15.2)

## 2016-03-20 LAB — BASIC METABOLIC PANEL
Anion gap: 11 (ref 5–15)
BUN: 24 mg/dL — ABNORMAL HIGH (ref 6–20)
CALCIUM: 8.3 mg/dL — AB (ref 8.9–10.3)
CHLORIDE: 100 mmol/L — AB (ref 101–111)
CO2: 31 mmol/L (ref 22–32)
Creatinine, Ser: 1.42 mg/dL — ABNORMAL HIGH (ref 0.61–1.24)
GFR calc Af Amer: 58 mL/min — ABNORMAL LOW (ref >60)
GFR, EST NON AFRICAN AMERICAN: 50 mL/min — AB (ref >60)
GLUCOSE: 108 mg/dL — AB (ref 65–99)
Potassium: 3.4 mmol/L — ABNORMAL LOW (ref 3.5–5.1)
Sodium: 142 mmol/L (ref 135–145)

## 2016-03-20 MED ORDER — WARFARIN SODIUM 10 MG PO TABS
10.0000 mg | ORAL_TABLET | Freq: Once | ORAL | Status: AC
Start: 2016-03-20 — End: 2016-03-20
  Administered 2016-03-20: 10 mg via ORAL
  Filled 2016-03-20: qty 1

## 2016-03-20 NOTE — Progress Notes (Signed)
Occupational Therapy Treatment and Discharge Patient Details Name: [name] MRN: [id number] DOB: 1951-02-12 Today's Date: 03/20/2016    History of present illness Pt admitted with worsening LE edema and abdominal bloating. PMH: DM, CKD, morbid obesity, LE edema, HTN, CHF 20-25%, afib, medical non compliance, depression.   OT comments  Pt educated and offered adaptive equipment for LB ADL, but refused. Performing ADL and ADL transfers at his baseline. No further OT needs.  Follow Up Recommendations  No OT follow up    Equipment Recommendations  None recommended by OT    Recommendations for Other Services      Precautions / Restrictions Precautions Precautions: None Restrictions Weight Bearing Restrictions: No       Mobility Bed Mobility Overal bed mobility: Independent                Transfers Overall transfer level: Modified independent Equipment used: None                  Balance     Sitting balance-Leahy Scale: Good       Standing balance-Leahy Scale: Good                     ADL Overall ADL's : At baseline                                       General ADL Comments: Educated pt in availability of adaptive equipment for LB bathing and dressing. Pt declined.      Vision                     Perception     Praxis      Cognition   Behavior During Therapy: Flat affect Overall Cognitive Status: Within Functional Limits for tasks assessed (negative demeanor, likely baseline)                       Extremity/Trunk Assessment               Exercises     Shoulder Instructions       General Comments      Pertinent Vitals/ Pain       Pain Assessment: No/denies pain  Home Living                                          Prior Functioning/Environment              Frequency Min 2X/week     Progress Toward Goals  OT Goals(current goals can now be found  in the care plan section)  Progress towards OT goals: Goals met/education completed, patient discharged from OT  Acute Rehab OT Goals Patient Stated Goal: None stated  Plan Discharge plan needs to be updated    Co-evaluation                 End of Session     Activity Tolerance Patient tolerated treatment well   Patient Left in chair;with call bell/phone within reach   Nurse Communication          Time: 1429-1445 OT Time Calculation (min): 16 min  Charges: OT General Charges $OT Visit: 1 Procedure OT Treatments $Self Care/Home Management : 8-22 mins  [name], [name]  03/20/2016, 2:50 PM  [phone]

## 2016-03-20 NOTE — Progress Notes (Signed)
ANTICOAGULATION CONSULT NOTE - Follow Up Consult  Pharmacy Consult for Lovenox and Coumadin Indication: atrial fibrillation and LV thrombus  No Known Allergies  Patient Measurements: Height: 5\' 8"  (172.7 cm) Weight: 222 lb 8 oz (100.925 kg) IBW/kg (Calculated) : 68.4  Vital Signs: Temp: 97.6 F (36.4 C) (05/12 0415) Temp Source: Oral (05/12 0415) BP: 138/100 mmHg (05/12 0415) Pulse Rate: 92 (05/12 0909)  Labs:  Recent Labs  03/18/16 0408 03/19/16 0344 03/20/16 0432  HGB 12.0* 11.8* 11.6*  HCT 39.2 39.9 37.6*  PLT 149* 141* 139*  LABPROT 16.4* 16.4* 17.3*  INR 1.31 1.31 1.41  CREATININE 1.46* 1.44* 1.42*    Estimated Creatinine Clearance: 59.7 mL/min (by C-G formula based on Cr of 1.42).  Assessment: 64 YOM on warfarin for history of AFib prior to admission but non-compliant, admit INR low at 1.57. Now with partially mobile apical thrombus noted on echo 5/7. Warfarin continued, Lovenox bridge while INR is subtherapeutic.   INr 1.41 this morning. Hgb 11.6, plts 139- slight decreases, no bleeding.  Prior home warfarin regimen: 6 mg daily   Goal of Therapy:  INR 2-3 Anti-Xa level 0.6-1 units/ml 4hrs after LMWH dose given Monitor platelets by anticoagulation protocol: Yes   Plan:  -Lovenox100 mg subQ q12h -Warfarin 10mg  x 1 tonight -Daily PT/INR, CBC q72h -Follow for s/s bleeding  Krina Mraz D. Greely Atiyeh, PharmD, BCPS Clinical Pharmacist Pager: [phone] 03/20/2016 10:22 AM

## 2016-03-20 NOTE — Progress Notes (Signed)
Physical Therapy Treatment Patient Details Name: [name] MRN: [id number] DOB: 23-Jan-1951 Today's Date: 03/24/16    History of Present Illness Pt admitted with worsening LE edema and abdominal bloating. PMH: DM, CKD, morbid obesity, LE edema, HTN, CHF 20-25%, afib, medical non compliance, depression.    PT Comments    Pt with steady gait and no further PT needed. Encouraged to continue to amb in halls.  Follow Up Recommendations  No PT follow up     Equipment Recommendations  None recommended by PT    Recommendations for Other Services       Precautions / Restrictions Precautions Precautions: Fall Restrictions Weight Bearing Restrictions: No    Mobility  Bed Mobility               General bed mobility comments: Pt sitting EOB  Transfers Overall transfer level: Modified independent Equipment used: None Transfers: Sit to/from Stand Sit to Stand: Modified independent (Device/Increase time)            Ambulation/Gait Ambulation/Gait assistance: Modified independent (Device/Increase time) Ambulation Distance (Feet): 300 Feet Assistive device: None Gait Pattern/deviations: Step-through pattern;Decreased stride length   Gait velocity interpretation: Below normal speed for age/gender General Gait Details: Slow, steady gait   Stairs            Wheelchair Mobility    Modified Rankin (Stroke Patients Only)       Balance     Sitting balance-Leahy Scale: Good       Standing balance-Leahy Scale: Good                      Cognition Arousal/Alertness: Awake/alert Behavior During Therapy: Flat affect Overall Cognitive Status: No family/caregiver present to determine baseline cognitive functioning (seems to know that his behaviors and diet are contributing to his health issues, but seems helpless/uninterested in doing anything about it)                      Exercises      General Comments        Pertinent  Vitals/Pain Pain Assessment: Faces Faces Pain Scale: No hurt    Home Living                      Prior Function            PT Goals (current goals can now be found in the care plan section) Progress towards PT goals: Goals met/education completed, patient discharged from PT    Frequency       PT Plan Discharge plan needs to be updated    Co-evaluation             End of Session   Activity Tolerance: Patient tolerated treatment well Patient left: with call bell/phone within reach;in bed (sitting EOB)     Time: 9562-1308 PT Time Calculation (min) (ACUTE ONLY): 8 min  Charges:  $Gait Training: 8-22 mins                    G Codes:      Raushanah Osmundson Mar 24, 2016, 9:39 AM [facility] PT [phone]

## 2016-03-20 NOTE — Progress Notes (Signed)
PROGRESS NOTE  [name]  ZOX:[id number] DOB: Oct 07, 1951 DOA: 03/14/2016 PCP: [name], MD Outpatient Specialists:  Dr. Tresa Endo, Cardiology From home alone  Brief Narrative:   [name] is a 65 y.o. male with history of uncontrolled type 2 diabetes, chronic kidney disease stage III, morbid obesity, chronic lower extremity edema, hypertension, chronic systolic heart failure EF 20-25%, obstructive sleep apnea, atrial fibrillation, noncompliant with his coumadin as well as his other anti-arrhythmics presented to the emergency room with worsening lower extremity edema and abdominal bloating.  He denied chest pain or palpitations.He was hospitalized in February 2017 with atrial fibrillation with RVR and had transesophageal echocardiogram on 2/27.  He was found to have a LA thrombus.  His DCCV cardioversion was canceled.  History was difficult to obtain, numerous tangental thought processes.   Since admission, his A. fib with RVR has resolved. His echocardiogram demonstrates persistent apical thrombus.  He has been diuresing well on Lasix 40 mg IV twice a day.  Unfortunately, he frankly admits that he will be noncompliant with his medications at home. He has been seen by psychiatry who has deemed him able to make his own medical decisions. I have consulted palliative care because of the patient's ongoing passive suicidal ideation and goal of comfort.  He may be a good candidate for hospice care given his recurrent admissions for heart failure and A. fib with RVR.   Assessment & Plan:   Principal Problem:   Noncompliance with medication regimen Active Problems:   Swelling of lower extremity   Depression   Type 2 diabetes, uncontrolled, with renal manifestation (HCC)   CKD (chronic kidney disease), stage III   HTN (hypertension)   Elevated troponin   Acute on chronic systolic and diastolic heart failure, NYHA class 3 (HCC)   Left atrial thrombus (HCC)   Atrial fibrillation  with rapid ventricular response (HCC)   DNR (do not resuscitate)   Palliative care encounter   Atrial fibrillation with RVR, CHAD2s- Vasc score 4 (htn, dm, age, chf).  Diltiazem contraindicated in the setting of systolic heart failure.  HR about 100bpm -  Continue metoprolol 25 mg every 6 hours, hold for systolic blood pressure less than 110 while diuresing but plan to resume once daily dosing closer to discharge -  Continue digoxin -  Continue Lovenox therapeutic dose and resume coumadin this evening per Pharmacy  Acute on chronic systolic congestive heart failure, biventricular failure, echo 12/30/15 ef 20-25%, currently 15-20% with blunted IVC.  Chest x-ray was clear.  -  Continue BB -  Hold ACEI due to CKD and possibility of hypotension -  Continue lasix 40mg  IV BID -  Wt stable, -11L   Left  Ventricular 1.9 x 2.1cm apical thrombus that is partially mobile -  Anticoagulation with lovenox to bridge to coumadin, if patient amenable -  Monitor for strokes  Elevated troponins, chest pain free in setting of CKD, a-fib with RVR, and heart failure.  Possible CAD. -  Due to medication noncompliance and CKD, not a good candidate for heart catheterization so will medically manage -  Resumed aspirin, BB, and high dose statin upon admission  Depression/psychosocial issues/noncompliance -  -  Psychiatry assistance appreciated:  Patient HAS CAPACITY TO MAKE HIS MEDICAL CONDITIONS -  Resume lexapro and abilify -  SW and CM.  Unlikely that he could afford ALF.  Needs maximum home health services vs. Hospice care  Diabetes mellitus, type II, diet controlled CBG stable - Hemoglobin A1c 6 -  Continue SSI  Acute on CKD stage 3, likely due to cardiorenal syndrome -  Diuresis as above  Essential hypertension, blood pressure low normal - Continue Lasix, metoprolol  DVT prophylaxis:  Therapeutic lovenox Code Status:  Full code  Family Communication:  Patient alone, discussed test results and  plan, questions answered Disposition Plan:   Palliative care consult to clarify patient's goals of care.  I suspect he will not qualify for SNF.  He cannot afford ALF and HH will not provide enough services to keep him out of hospital.  Recommended hospice care if he qualifies given his readmissions, goals of care, and EF of 15-20%.     Consultants:   Psychiatry  Palliative care  Procedures:  ECHO  Antimicrobials:   none    Subjective: He has no new complaints reported to me today. No acute issues overnight. States the swelling in his legs has gone down.  Objective: Filed Vitals:   03/20/16 0909 03/20/16 1303 03/20/16 1448 03/20/16 1450  BP:  155/94 143/103 155/98  Pulse: 92 99 68 107  Temp:      TempSrc:      Resp:   20 20  Height:      Weight:      SpO2:   99% 98%    Intake/Output Summary (Last 24 hours) at 03/20/16 1825 Last data filed at 03/20/16 1450  Gross per 24 hour  Intake    629 ml  Output   2975 ml  Net  -2346 ml   Filed Weights   03/18/16 0551 03/19/16 0511 03/20/16 0415  Weight: 105.144 kg (231 lb 12.8 oz) 102.876 kg (226 lb 12.8 oz) 100.925 kg (222 lb 8 oz)    Examination:  General exam:  Adult male.  NAD. Alert and awake HEENT:  NCAT, MMM Respiratory system:  No wheezes, equal chest rise Cardiovascular system: IRRR, tachycardic. Warm extremities Gastrointestinal system:  Normal active bowel sounds, distended, soft and nontender.  Skin: No rashes, lesions or ulcers MSK:  Normal tone and bulk, 2+ pitting bilateral lower extremity edema with stasis dermatitis Neuro:  Grossly intact Psychiatry:  Poor insight.    Data Reviewed: I have personally reviewed following labs and imaging studies  CBC:  Recent Labs Lab 03/14/16 1439  03/16/16 0439 03/17/16 0218 03/18/16 0408 03/19/16 0344 03/20/16 0432  WBC 7.0  < > 8.5 6.4 7.4 6.2 5.1  NEUTROABS 5.0  --   --   --   --   --   --   HGB 12.7*  < > 12.7* 12.0* 12.0* 11.8* 11.6*  HCT 41.5  < >  41.3 39.7 39.2 39.9 37.6*  MCV 89.4  < > 91.4 89.6 89.7 90.1 91.9  PLT 191  < > 192 156 149* 141* 139*  < > = values in this interval not displayed. Basic Metabolic Panel:  Recent Labs Lab 03/16/16 0439 03/17/16 0218 03/18/16 0408 03/19/16 0344 03/20/16 0432  NA 134* 137 142 141 142  K 4.0 3.9 3.5 3.6 3.4*  CL 104 102 105 100* 100*  CO2 19* 24 24 29 31   GLUCOSE 134* 92 150* 98 108*  BUN 37* 33* 30* 27* 24*  CREATININE 1.62* 1.68* 1.46* 1.44* 1.42*  CALCIUM 8.3* 8.2* 8.3* 8.5* 8.3*   GFR: Estimated Creatinine Clearance: 59.7 mL/min (by C-G formula based on Cr of 1.42). Liver Function Tests: No results for input(s): AST, ALT, ALKPHOS, BILITOT, PROT, ALBUMIN in the last 168 hours. No results for input(s): LIPASE, AMYLASE in the  last 168 hours. No results for input(s): AMMONIA in the last 168 hours. Coagulation Profile:  Recent Labs Lab 03/14/16 1439 03/18/16 0408 03/19/16 0344 03/20/16 0432  INR 1.57* 1.31 1.31 1.41   Cardiac Enzymes:  Recent Labs Lab 03/14/16 2335 03/15/16 0525  TROPONINI 0.42* 0.55*   BNP (last 3 results) No results for input(s): PROBNP in the last 8760 hours. HbA1C: No results for input(s): HGBA1C in the last 72 hours. CBG:  Recent Labs Lab 03/18/16 0257  GLUCAP 119*   Lipid Profile: No results for input(s): CHOL, HDL, LDLCALC, TRIG, CHOLHDL, LDLDIRECT in the last 72 hours. Thyroid Function Tests: No results for input(s): TSH, T4TOTAL, FREET4, T3FREE, THYROIDAB in the last 72 hours. Anemia Panel: No results for input(s): VITAMINB12, FOLATE, FERRITIN, TIBC, IRON, RETICCTPCT in the last 72 hours. Urine analysis:    Component Value Date/Time   COLORURINE YELLOW 05/09/2014 0138   APPEARANCEUR CLEAR 05/09/2014 0138   LABSPEC 1.023 05/09/2014 0138   PHURINE 5.0 05/09/2014 0138   GLUCOSEU NEGATIVE 05/09/2014 0138   HGBUR TRACE* 05/09/2014 0138   BILIRUBINUR NEGATIVE 05/09/2014 0138   KETONESUR 15* 05/09/2014 0138   PROTEINUR 30*  05/09/2014 0138   UROBILINOGEN 0.2 05/09/2014 0138   NITRITE NEGATIVE 05/09/2014 0138   LEUKOCYTESUR NEGATIVE 05/09/2014 0138   Sepsis Labs: (procalcitonin:4,lacticidven:4)  ) Recent Results (from the past 240 hour(s))  MRSA PCR Screening     Status: None   Collection Time: 03/14/16  8:30 PM  Result Value Ref Range Status   MRSA by PCR NEGATIVE NEGATIVE Final    Comment:        The GeneXpert MRSA Assay (FDA approved for NASAL specimens only), is one component of a comprehensive MRSA colonization surveillance program. It is not intended to diagnose MRSA infection nor to guide or monitor treatment for MRSA infections.       Radiology Studies: No results found.   Scheduled Meds: . ARIPiprazole  2 mg Oral BID  . atorvastatin  40 mg Oral q1800  . digoxin  0.125 mg Oral Daily  . enoxaparin (LOVENOX) injection  100 mg Subcutaneous Q12H  . escitalopram  5 mg Oral Daily  . furosemide  40 mg Intravenous BID  . metoprolol tartrate  25 mg Oral Q6H  . sodium chloride flush  3 mL Intravenous Q12H  . warfarin  10 mg Oral ONCE-1800  . Warfarin - Pharmacist Dosing Inpatient   Does not apply q1800   Continuous Infusions:     LOS: 6 days    Time spent: 30 min    Penny Pia, MD [facility] Pager [phone]  If 7PM-7AM, please contact night-coverage [email or web address] Password [facility] 03/20/2016, 6:25 PM

## 2016-03-20 NOTE — Progress Notes (Signed)
Physical Therapy Discharge Patient Details Name: [name] MRN: [id number] DOB: 10/08/1951 Today's Date: 03/20/2016 Time: 0924-0932 PT Time Calculation (min) (ACUTE ONLY): 8 min  Patient discharged from PT services secondary to goals met and no further PT needs identified.  Please see latest therapy progress note for current level of functioning and progress toward goals.    Progress and discharge plan discussed with patient and/or caregiver: Patient/Caregiver agrees with plan  GP     , 03/20/2016, 9:39 AM    PT [phone]  

## 2016-03-20 NOTE — Care Management Important Message (Signed)
Important Message  Patient Details  Name: [name] MRN: [id number] Date of Birth: [birth year]   Medicare Important Message Given:  Yes    Rhianon Zabawa Abena 03/20/2016, 11:05 AM

## 2016-03-21 LAB — BASIC METABOLIC PANEL
ANION GAP: 13 (ref 5–15)
BUN: 22 mg/dL — ABNORMAL HIGH (ref 6–20)
CHLORIDE: 100 mmol/L — AB (ref 101–111)
CO2: 30 mmol/L (ref 22–32)
Calcium: 8.6 mg/dL — ABNORMAL LOW (ref 8.9–10.3)
Creatinine, Ser: 1.23 mg/dL (ref 0.61–1.24)
GFR calc non Af Amer: 60 mL/min — ABNORMAL LOW (ref >60)
Glucose, Bld: 139 mg/dL — ABNORMAL HIGH (ref 65–99)
Potassium: 3.8 mmol/L (ref 3.5–5.1)
Sodium: 143 mmol/L (ref 135–145)

## 2016-03-21 LAB — PROTIME-INR
INR: 1.58 — AB (ref 0.00–1.49)
Prothrombin Time: 18.9 seconds — ABNORMAL HIGH (ref 11.6–15.2)

## 2016-03-21 MED ORDER — FUROSEMIDE 40 MG PO TABS
40.0000 mg | ORAL_TABLET | Freq: Every day | ORAL | Status: DC
  Administered 2016-03-22 – 2016-03-23 (×2): 40 mg via ORAL
  Filled 2016-03-21 (×2): qty 1

## 2016-03-21 MED ORDER — WARFARIN SODIUM 10 MG PO TABS
10.0000 mg | ORAL_TABLET | Freq: Once | ORAL | Status: AC
  Administered 2016-03-21: 10 mg via ORAL
  Filled 2016-03-21: qty 1

## 2016-03-21 NOTE — Progress Notes (Signed)
ANTICOAGULATION CONSULT NOTE - Follow Up Consult  Pharmacy Consult for Lovenox and Coumadin Indication: atrial fibrillation and LV thrombus  No Known Allergies  Patient Measurements: Height: 5\' 8"  (172.7 cm) Weight: 216 lb 3.2 oz (98.068 kg) IBW/kg (Calculated) : 68.4  Vital Signs: Temp: 97.6 F (36.4 C) (05/13 0526) Temp Source: Oral (05/13 0526) BP: 120/81 mmHg (05/13 0904) Pulse Rate: 85 (05/13 0904)  Labs:  Recent Labs  03/19/16 0344 03/20/16 0432 03/21/16 0504  HGB 11.8* 11.6*  --   HCT 39.9 37.6*  --   PLT 141* 139*  --   LABPROT 16.4* 17.3* 18.9*  INR 1.31 1.41 1.58*  CREATININE 1.44* 1.42* 1.23    Estimated Creatinine Clearance: 68 mL/min (by C-G formula based on Cr of 1.23).  Assessment: 64 YOM on warfarin for history of AFib prior to admission but non-compliant, admit INR low at 1.57. Now with partially mobile apical thrombus noted on echo 5/7. Warfarin continued, Lovenox bridge while INR is subtherapeutic.   INR 1.58 this morning. Hgb 11.6, plts 139- slight decreases, no bleeding.  Prior home warfarin regimen: 6 mg daily   Goal of Therapy:  INR 2-3 Anti-Xa level 0.6-1 units/ml 4hrs after LMWH dose given Monitor platelets by anticoagulation protocol: Yes   Plan:  -Lovenox 100 mg subQ q12h -Warfarin 10mg  x 1 tonight -Daily PT/INR, CBC q72h- next due 5/15 -Follow for s/s bleeding  Reginaldo Hazard D. Zarek Relph, PharmD, BCPS Clinical Pharmacist Pager: [phone] 03/21/2016 11:38 AM

## 2016-03-21 NOTE — Progress Notes (Signed)
PROGRESS NOTE  [name]  JXB:[id number] DOB: 03/16/1951 DOA: 03/14/2016 PCP: [name], MD Outpatient Specialists:  Dr. Tresa Endo, Cardiology From home alone  Brief Narrative:   [name] is a 65 y.o. male with history of uncontrolled type 2 diabetes, chronic kidney disease stage III, morbid obesity, chronic lower extremity edema, hypertension, chronic systolic heart failure EF 20-25%, obstructive sleep apnea, atrial fibrillation, noncompliant with his coumadin as well as his other anti-arrhythmics presented to the emergency room with worsening lower extremity edema and abdominal bloating.  He denied chest pain or palpitations.He was hospitalized in February 2017 with atrial fibrillation with RVR and had transesophageal echocardiogram on 2/27.  He was found to have a LA thrombus.  His DCCV cardioversion was canceled.  History was difficult to obtain, numerous tangental thought processes.   Since admission, his A. fib with RVR has resolved. His echocardiogram demonstrates persistent apical thrombus.  He has been diuresing well on Lasix 40 mg IV twice a day.  Unfortunately, he frankly admits that he will be noncompliant with his medications at home. He has been seen by psychiatry who has deemed him able to make his own medical decisions. I have consulted palliative care because of the patient's ongoing passive suicidal ideation and goal of comfort.  He may be a good candidate for hospice care given his recurrent admissions for heart failure and A. fib with RVR.   Assessment & Plan:   Principal Problem:   Noncompliance with medication regimen Active Problems:   Swelling of lower extremity   Depression   Type 2 diabetes, uncontrolled, with renal manifestation (HCC)   CKD (chronic kidney disease), stage III   HTN (hypertension)   Elevated troponin   Acute on chronic systolic and diastolic heart failure, NYHA class 3 (HCC)   Left atrial thrombus (HCC)   Atrial fibrillation  with rapid ventricular response (HCC)   DNR (do not resuscitate)   Palliative care encounter   Atrial fibrillation with RVR, CHAD2s- Vasc score 4 (htn, dm, age, chf).  Diltiazem contraindicated in the setting of systolic heart failure.  HR about 100bpm -  Continue metoprolol 25 mg every 6 hours, hold for systolic blood pressure less than 110 while diuresing but plan to resume once daily dosing closer to discharge -  Continue digoxin -  Continue Lovenox therapeutic dose and resume coumadin per Pharmacy  Acute on chronic systolic congestive heart failure, biventricular failure, echo 12/30/15 ef 20-25%, currently 15-20% with blunted IVC.  Chest x-ray was clear.  -  Continue BB -  Hold ACEI due to CKD and possibility of hypotension -  Continue lasix  IV BID -  Wt stable, -14L   Left  Ventricular 1.9 x 2.1cm apical thrombus that is partially mobile -  Anticoagulation with lovenox to bridge to coumadin, if patient amenable -  Monitor for strokes  Elevated troponins, chest pain free in setting of CKD, a-fib with RVR, and heart failure.  Possible CAD. -  Due to medication noncompliance and CKD, not a good candidate for heart catheterization so will medically manage -  Resumed aspirin, BB, and high dose statin upon admission  Depression/psychosocial issues/noncompliance -  -  Psychiatry assistance appreciated:  Patient HAS CAPACITY TO MAKE HIS MEDICAL CONDITIONS -  Resume lexapro and abilify -  SW and CM.  Unlikely that he could afford ALF.  Needs maximum home health services vs. Hospice care  Diabetes mellitus, type II, diet controlled CBG stable - Hemoglobin A1c 6 -  Continue SSI  Acute on CKD stage 3, likely due to cardiorenal syndrome -  Diuresis as above  Essential hypertension, blood pressure low normal - Continue Lasix, metoprolol  DVT prophylaxis:  Therapeutic lovenox Code Status:  Full code  Family Communication:  Patient alone, discussed test results and plan,  questions answered Disposition Plan:   Palliative care consult to clarify patient's goals of care.  I suspect he will not qualify for SNF.  He cannot afford ALF and HH will not provide enough services to keep him out of hospital.  Recommended hospice care if he qualifies given his readmissions, goals of care, and EF of 15-20%.     Consultants:   Psychiatry  Palliative care  Procedures:  ECHO  Antimicrobials:   none    Subjective: He states that the swelling of his legs has gone down. He has no new complaints today.  Objective: Filed Vitals:   03/20/16 2245 03/21/16 0526 03/21/16 0904 03/21/16 1248  BP: 137/87 143/85 120/81 130/90  Pulse: 87 85 85 97  Temp:  97.6 F (36.4 C)    TempSrc:  Oral    Resp:  18    Height:      Weight:  98.068 kg (216 lb 3.2 oz)    SpO2:  96% 95% 98%    Intake/Output Summary (Last 24 hours) at 03/21/16 1532 Last data filed at 03/21/16 1249  Gross per 24 hour  Intake    420 ml  Output   3625 ml  Net  -3205 ml   Filed Weights   03/19/16 0511 03/20/16 0415 03/21/16 0526  Weight: 102.876 kg (226 lb 12.8 oz) 100.925 kg (222 lb 8 oz) 98.068 kg (216 lb 3.2 oz)    Examination:  General exam:  Adult male.  NAD. Alert and awake HEENT:  NCAT, MMM Respiratory system:  No wheezes, equal chest rise Cardiovascular system: IRRR, tachycardic. Warm extremities Gastrointestinal system:  Normal active bowel sounds, distended, soft and nontender.  Skin: No rashes, lesions or ulcers MSK:  Normal tone and bulk, 1+ pitting bilateral lower extremity edema with stasis dermatitis Neuro:  Grossly intact Psychiatry:  Poor insight.    Data Reviewed: I have personally reviewed following labs and imaging studies  CBC:  Recent Labs Lab 03/16/16 0439 03/17/16 0218 03/18/16 0408 03/19/16 0344 03/20/16 0432  WBC 8.5 6.4 7.4 6.2 5.1  HGB 12.7* 12.0* 12.0* 11.8* 11.6*  HCT 41.3 39.7 39.2 39.9 37.6*  MCV 91.4 89.6 89.7 90.1 91.9  PLT 192 156 149* 141* 139*    Basic Metabolic Panel:  Recent Labs Lab 03/17/16 0218 03/18/16 0408 03/19/16 0344 03/20/16 0432 03/21/16 0504  NA 137 142 141 142 143  K 3.9 3.5 3.6 3.4* 3.8  CL 102 105 100* 100* 100*  CO2 24 24 29 31 30   GLUCOSE 92 150* 98 108* 139*  BUN 33* 30* 27* 24* 22*  CREATININE 1.68* 1.46* 1.44* 1.42* 1.23  CALCIUM 8.2* 8.3* 8.5* 8.3* 8.6*   GFR: Estimated Creatinine Clearance: 68 mL/min (by C-G formula based on Cr of 1.23). Liver Function Tests: No results for input(s): AST, ALT, ALKPHOS, BILITOT, PROT, ALBUMIN in the last 168 hours. No results for input(s): LIPASE, AMYLASE in the last 168 hours. No results for input(s): AMMONIA in the last 168 hours. Coagulation Profile:  Recent Labs Lab 03/18/16 0408 03/19/16 0344 03/20/16 0432 03/21/16 0504  INR 1.31 1.31 1.41 1.58*   Cardiac Enzymes:  Recent Labs Lab 03/14/16 2335 03/15/16 0525  TROPONINI 0.42* 0.55*  BNP (last 3 results) No results for input(s): PROBNP in the last 8760 hours. HbA1C: No results for input(s): HGBA1C in the last 72 hours. CBG:  Recent Labs Lab 03/18/16 0257  GLUCAP 119*   Lipid Profile: No results for input(s): CHOL, HDL, LDLCALC, TRIG, CHOLHDL, LDLDIRECT in the last 72 hours. Thyroid Function Tests: No results for input(s): TSH, T4TOTAL, FREET4, T3FREE, THYROIDAB in the last 72 hours. Anemia Panel: No results for input(s): VITAMINB12, FOLATE, FERRITIN, TIBC, IRON, RETICCTPCT in the last 72 hours. Urine analysis:    Component Value Date/Time   COLORURINE YELLOW 05/09/2014 0138   APPEARANCEUR CLEAR 05/09/2014 0138   LABSPEC 1.023 05/09/2014 0138   PHURINE 5.0 05/09/2014 0138   GLUCOSEU NEGATIVE 05/09/2014 0138   HGBUR TRACE* 05/09/2014 0138   BILIRUBINUR NEGATIVE 05/09/2014 0138   KETONESUR 15* 05/09/2014 0138   PROTEINUR 30* 05/09/2014 0138   UROBILINOGEN 0.2 05/09/2014 0138   NITRITE NEGATIVE 05/09/2014 0138   LEUKOCYTESUR NEGATIVE 05/09/2014 0138   Sepsis  Labs: @LABRCNTIP (procalcitonin:4,lacticidven:4)  ) Recent Results (from the past 240 hour(s))  MRSA PCR Screening     Status: None   Collection Time: 03/14/16  8:30 PM  Result Value Ref Range Status   MRSA by PCR NEGATIVE NEGATIVE Final    Comment:        The GeneXpert MRSA Assay (FDA approved for NASAL specimens only), is one component of a comprehensive MRSA colonization surveillance program. It is not intended to diagnose MRSA infection nor to guide or monitor treatment for MRSA infections.       Radiology Studies: No results found.   Scheduled Meds: . ARIPiprazole  2 mg Oral BID  . atorvastatin  40 mg Oral q1800  . digoxin  0.125 mg Oral Daily  . enoxaparin (LOVENOX) injection  100 mg Subcutaneous Q12H  . escitalopram  5 mg Oral Daily  . furosemide  40 mg Intravenous BID  . metoprolol tartrate  25 mg Oral Q6H  . sodium chloride flush  3 mL Intravenous Q12H  . warfarin  10 mg Oral ONCE-1800  . Warfarin - Pharmacist Dosing Inpatient   Does not apply q1800   Continuous Infusions:     LOS: 7 days    Time spent: 30 min    Penny Pia, MD [facility] Pager [phone]  If 7PM-7AM, please contact night-coverage [email or web address] Password [facility] 03/21/2016, 3:32 PM

## 2016-03-22 LAB — BASIC METABOLIC PANEL
ANION GAP: 12 (ref 5–15)
BUN: 22 mg/dL — ABNORMAL HIGH (ref 6–20)
CALCIUM: 8.4 mg/dL — AB (ref 8.9–10.3)
CHLORIDE: 100 mmol/L — AB (ref 101–111)
CO2: 31 mmol/L (ref 22–32)
Creatinine, Ser: 1.12 mg/dL (ref 0.61–1.24)
GFR calc non Af Amer: >60 mL/min (ref >60)
Glucose, Bld: 110 mg/dL — ABNORMAL HIGH (ref 65–99)
POTASSIUM: 3.4 mmol/L — AB (ref 3.5–5.1)
Sodium: 143 mmol/L (ref 135–145)

## 2016-03-22 LAB — PROTIME-INR
INR: 1.9 — AB (ref 0.00–1.49)
Prothrombin Time: 21.7 seconds — ABNORMAL HIGH (ref 11.6–15.2)

## 2016-03-22 MED ORDER — WARFARIN SODIUM 3 MG PO TABS
6.0000 mg | ORAL_TABLET | Freq: Once | ORAL | Status: AC
  Administered 2016-03-22: 6 mg via ORAL
  Filled 2016-03-22: qty 2

## 2016-03-22 NOTE — Progress Notes (Signed)
ANTICOAGULATION CONSULT NOTE - Follow Up Consult  Pharmacy Consult for Lovenox and Coumadin Indication: atrial fibrillation and LV thrombus  No Known Allergies  Patient Measurements: Height: 5\' 8"  (172.7 cm) Weight: 215 lb 11.2 oz (97.841 kg) IBW/kg (Calculated) : 68.4  Vital Signs: Temp: 97.6 F (36.4 C) (05/14 0503) Temp Source: Oral (05/14 0503) BP: 149/87 mmHg (05/14 1212) Pulse Rate: 107 (05/14 1212)  Labs:  Recent Labs  03/20/16 0432 03/21/16 0504 03/22/16 0308  HGB 11.6*  --   --   HCT 37.6*  --   --   PLT 139*  --   --   LABPROT 17.3* 18.9* 21.7*  INR 1.41 1.58* 1.90*  CREATININE 1.42* 1.23 1.12    Estimated Creatinine Clearance: 74.6 mL/min (by C-G formula based on Cr of 1.12).  Assessment: 64 YOM on warfarin for history of AFib prior to admission but non-compliant, admit INR low at 1.57. Now with partially mobile apical thrombus noted on echo 5/7.  Warfarin continued, Lovenox bridge while INR is subtherapeutic. He has completed 5 days of overlap therapy, but INR is not yet therapeutic.  INR 1.9 this morning. Hgb 11.6, plts 139- no bleeding noted.  Prior home warfarin regimen: 6 mg daily   Goal of Therapy:  INR 2-3 Anti-Xa level 0.6-1 units/ml 4hrs after LMWH dose given Monitor platelets by anticoagulation protocol: Yes   Plan:  -Lovenox 100 mg subQ q12h -Warfarin 6mg  x 1 tonight -Daily PT/INR, CBC q72h- next due 5/15 -Follow for s/s bleeding  Yardley Beltran D. Kailene Steinhart, PharmD, BCPS Clinical Pharmacist Pager: [phone] 03/22/2016 12:25 PM

## 2016-03-22 NOTE — Progress Notes (Signed)
PROGRESS NOTE  [name]  UJW:[id number] DOB: 1950-12-06 DOA: 03/14/2016 PCP: [name], MD Outpatient Specialists:  Dr. Tresa Endo, Cardiology From home alone  Brief Narrative:   [name] is a 65 y.o. male with history of uncontrolled type 2 diabetes, chronic kidney disease stage III, morbid obesity, chronic lower extremity edema, hypertension, chronic systolic heart failure EF 20-25%, obstructive sleep apnea, atrial fibrillation, noncompliant with his coumadin as well as his other anti-arrhythmics presented to the emergency room with worsening lower extremity edema and abdominal bloating.  He denied chest pain or palpitations.He was hospitalized in February 2017 with atrial fibrillation with RVR and had transesophageal echocardiogram on 2/27.  He was found to have a LA thrombus.  His DCCV cardioversion was canceled.  History was difficult to obtain, numerous tangental thought processes.   Since admission, his A. fib with RVR has resolved. His echocardiogram demonstrates persistent apical thrombus.  He has been diuresing well on Lasix 40 mg IV twice a day.  Unfortunately, he frankly admits that he will be noncompliant with his medications at home. He has been seen by psychiatry who has deemed him able to make his own medical decisions. I have consulted palliative care because of the patient's ongoing passive suicidal ideation and goal of comfort.  He may be a good candidate for hospice care given his recurrent admissions for heart failure and A. fib with RVR.   Assessment & Plan:   Principal Problem:   Noncompliance with medication regimen Active Problems:   Swelling of lower extremity   Depression   Type 2 diabetes, uncontrolled, with renal manifestation (HCC)   CKD (chronic kidney disease), stage III   HTN (hypertension)   Elevated troponin   Acute on chronic systolic and diastolic heart failure, NYHA class 3 (HCC)   Left atrial thrombus (HCC)   Atrial fibrillation  with rapid ventricular response (HCC)   DNR (do not resuscitate)   Palliative care encounter   Atrial fibrillation with RVR, CHAD2s- Vasc score 4 (htn, dm, age, chf).  Diltiazem contraindicated in the setting of systolic heart failure.  -  Continue metoprolol 25 mg every 6 hours, hold for systolic blood pressure less than 110 while diuresing but plan to resume once daily dosing closer to discharge -  Continue digoxin -  Continue Lovenox therapeutic dose and coumadin per Pharmacy. Nearing therapeutic INR levels  Acute on chronic systolic congestive heart failure, biventricular failure, echo 12/30/15 ef 20-25%, currently 15-20% with blunted IVC.  Chest x-ray was clear.  -  Continue BB -  Hold ACEI due to CKD and possibility of hypotension -  Continue lasix  IV BID -  Wt stable, -14L   Left  Ventricular 1.9 x 2.1cm apical thrombus that is partially mobile -  Anticoagulation with lovenox to bridge to coumadin, if patient amenable -  Monitor for strokes  Elevated troponins, chest pain free in setting of CKD, a-fib with RVR, and heart failure.  Possible CAD. -  Due to medication noncompliance and CKD, not a good candidate for heart catheterization so will medically manage -  Resumed aspirin, BB, and high dose statin upon admission  Depression/psychosocial issues/noncompliance -  -  Psychiatry assistance appreciated:  Patient HAS CAPACITY TO MAKE HIS MEDICAL CONDITIONS -  Resume lexapro and abilify -  SW and CM.  Unlikely that he could afford ALF.  Needs maximum home health services vs. Hospice care  Diabetes mellitus, type II, diet controlled CBG stable - Hemoglobin A1c 6 -  Continue SSI  Acute on CKD stage 3, likely due to cardiorenal syndrome -  Diuresis as above  Essential hypertension, blood pressure low normal - Continue Lasix, metoprolol  DVT prophylaxis:  Therapeutic lovenox Code Status:  Full code  Family Communication:  Patient alone, discussed test results and plan,  questions answered Disposition Plan:   Palliative care consult to clarify patient's goals of care.  I suspect he will not qualify for SNF.  He cannot afford ALF and HH will not provide enough services to keep him out of hospital.  Recommended hospice care if he qualifies given his readmissions, goals of care, and EF of 15-20%.     Consultants:   Psychiatry  Palliative care  Procedures:  ECHO  Antimicrobials:   none    Subjective: Reports no complaints. No acute issues overnight.  Objective: Filed Vitals:   03/22/16 0503 03/22/16 0803 03/22/16 0805 03/22/16 1212  BP: 145/91  138/94 149/87  Pulse: 96 93  107  Temp: 97.6 F (36.4 C)     TempSrc: Oral     Resp: 18     Height:      Weight: 97.841 kg (215 lb 11.2 oz)     SpO2: 93%  95%     Intake/Output Summary (Last 24 hours) at 03/22/16 1507 Last data filed at 03/22/16 0504  Gross per 24 hour  Intake      0 ml  Output    975 ml  Net   -975 ml   Filed Weights   03/20/16 0415 03/21/16 0526 03/22/16 0503  Weight: 100.925 kg (222 lb 8 oz) 98.068 kg (216 lb 3.2 oz) 97.841 kg (215 lb 11.2 oz)    Examination:  General exam:  Adult male.  NAD. Alert and awake HEENT:  NCAT, MMM Respiratory system:  No wheezes, equal chest rise Cardiovascular system: IRRR, tachycardic. Warm extremities Gastrointestinal system:  Normal active bowel sounds, distended, soft and nontender.  Skin: No rashes, lesions or ulcers MSK:  Normal tone and bulk, 1+ pitting bilateral lower extremity edema with stasis dermatitis Neuro:  Grossly intact Psychiatry:  Poor insight.    Data Reviewed: I have personally reviewed following labs and imaging studies  CBC:  Recent Labs Lab 03/16/16 0439 03/17/16 0218 03/18/16 0408 03/19/16 0344 03/20/16 0432  WBC 8.5 6.4 7.4 6.2 5.1  HGB 12.7* 12.0* 12.0* 11.8* 11.6*  HCT 41.3 39.7 39.2 39.9 37.6*  MCV 91.4 89.6 89.7 90.1 91.9  PLT 192 156 149* 141* 139*   Basic Metabolic Panel:  Recent Labs Lab  03/18/16 0408 03/19/16 0344 03/20/16 0432 03/21/16 0504 03/22/16 0308  NA 142 141 142 143 143  K 3.5 3.6 3.4* 3.8 3.4*  CL 105 100* 100* 100* 100*  CO2 24 29 31 30 31   GLUCOSE 150* 98 108* 139* 110*  BUN 30* 27* 24* 22* 22*  CREATININE 1.46* 1.44* 1.42* 1.23 1.12  CALCIUM 8.3* 8.5* 8.3* 8.6* 8.4*   GFR: Estimated Creatinine Clearance: 74.6 mL/min (by C-G formula based on Cr of 1.12). Liver Function Tests: No results for input(s): AST, ALT, ALKPHOS, BILITOT, PROT, ALBUMIN in the last 168 hours. No results for input(s): LIPASE, AMYLASE in the last 168 hours. No results for input(s): AMMONIA in the last 168 hours. Coagulation Profile:  Recent Labs Lab 03/18/16 0408 03/19/16 0344 03/20/16 0432 03/21/16 0504 03/22/16 0308  INR 1.31 1.31 1.41 1.58* 1.90*   Cardiac Enzymes: No results for input(s): CKTOTAL, CKMB, CKMBINDEX, TROPONINI in the last 168 hours. BNP (last  3 results) No results for input(s): PROBNP in the last 8760 hours. HbA1C: No results for input(s): HGBA1C in the last 72 hours. CBG:  Recent Labs Lab 03/18/16 0257  GLUCAP 119*   Lipid Profile: No results for input(s): CHOL, HDL, LDLCALC, TRIG, CHOLHDL, LDLDIRECT in the last 72 hours. Thyroid Function Tests: No results for input(s): TSH, T4TOTAL, FREET4, T3FREE, THYROIDAB in the last 72 hours. Anemia Panel: No results for input(s): VITAMINB12, FOLATE, FERRITIN, TIBC, IRON, RETICCTPCT in the last 72 hours. Urine analysis:    Component Value Date/Time   COLORURINE YELLOW 05/09/2014 0138   APPEARANCEUR CLEAR 05/09/2014 0138   LABSPEC 1.023 05/09/2014 0138   PHURINE 5.0 05/09/2014 0138   GLUCOSEU NEGATIVE 05/09/2014 0138   HGBUR TRACE* 05/09/2014 0138   BILIRUBINUR NEGATIVE 05/09/2014 0138   KETONESUR 15* 05/09/2014 0138   PROTEINUR 30* 05/09/2014 0138   UROBILINOGEN 0.2 05/09/2014 0138   NITRITE NEGATIVE 05/09/2014 0138   LEUKOCYTESUR NEGATIVE 05/09/2014 0138   Sepsis  Labs: @LABRCNTIP (procalcitonin:4,lacticidven:4)  ) Recent Results (from the past 240 hour(s))  MRSA PCR Screening     Status: None   Collection Time: 03/14/16  8:30 PM  Result Value Ref Range Status   MRSA by PCR NEGATIVE NEGATIVE Final    Comment:        The GeneXpert MRSA Assay (FDA approved for NASAL specimens only), is one component of a comprehensive MRSA colonization surveillance program. It is not intended to diagnose MRSA infection nor to guide or monitor treatment for MRSA infections.       Radiology Studies: No results found.   Scheduled Meds: . ARIPiprazole  2 mg Oral BID  . atorvastatin  40 mg Oral q1800  . digoxin  0.125 mg Oral Daily  . enoxaparin (LOVENOX) injection  100 mg Subcutaneous Q12H  . escitalopram  5 mg Oral Daily  . furosemide  40 mg Oral Daily  . metoprolol tartrate  25 mg Oral Q6H  . sodium chloride flush  3 mL Intravenous Q12H  . warfarin  6 mg Oral ONCE-1800  . Warfarin - Pharmacist Dosing Inpatient   Does not apply q1800   Continuous Infusions:     LOS: 8 days    Time spent: 30 min    Penny Pia, MD [facility] Pager [phone]  If 7PM-7AM, please contact night-coverage [email or web address] Password [email or web address] 03/22/2016, 3:07 PM

## 2016-03-23 LAB — CBC
HCT: 39.7 % (ref 39.0–52.0)
Hemoglobin: 12.3 g/dL — ABNORMAL LOW (ref 13.0–17.0)
MCH: 28 pg (ref 26.0–34.0)
MCHC: 31 g/dL (ref 30.0–36.0)
MCV: 90.4 fL (ref 78.0–100.0)
PLATELETS: 116 10*3/uL — AB (ref 150–400)
RBC: 4.39 MIL/uL (ref 4.22–5.81)
RDW: 18.1 % — AB (ref 11.5–15.5)
WBC: 6 10*3/uL (ref 4.0–10.5)

## 2016-03-23 LAB — BASIC METABOLIC PANEL
ANION GAP: 11 (ref 5–15)
BUN: 18 mg/dL (ref 6–20)
CO2: 30 mmol/L (ref 22–32)
Calcium: 8.6 mg/dL — ABNORMAL LOW (ref 8.9–10.3)
Chloride: 99 mmol/L — ABNORMAL LOW (ref 101–111)
Creatinine, Ser: 1.2 mg/dL (ref 0.61–1.24)
GFR calc Af Amer: >60 mL/min (ref >60)
Glucose, Bld: 110 mg/dL — ABNORMAL HIGH (ref 65–99)
POTASSIUM: 3.4 mmol/L — AB (ref 3.5–5.1)
SODIUM: 140 mmol/L (ref 135–145)

## 2016-03-23 LAB — PROTIME-INR
INR: 2.08 — ABNORMAL HIGH (ref 0.00–1.49)
PROTHROMBIN TIME: 23.2 s — AB (ref 11.6–15.2)

## 2016-03-23 MED ORDER — METOPROLOL SUCCINATE ER 100 MG PO TB24: 100.0000 mg | ORAL_TABLET | Freq: Two times a day (BID) | ORAL | Status: DC

## 2016-03-23 MED ORDER — ATORVASTATIN CALCIUM 40 MG PO TABS: 40.0000 mg | ORAL_TABLET | Freq: Every day | ORAL | Status: DC

## 2016-03-23 MED ORDER — WARFARIN SODIUM 7.5 MG PO TABS
7.5000 mg | ORAL_TABLET | Freq: Every day | ORAL | Status: DC
Start: 2016-03-23 — End: 2016-03-23

## 2016-03-23 MED ORDER — DIGOXIN 125 MCG PO TABS: 0.1250 mg | ORAL_TABLET | Freq: Every day | ORAL | Status: DC

## 2016-03-23 MED ORDER — WARFARIN SODIUM 6 MG PO TABS: 6.0000 mg | ORAL_TABLET | Freq: Once | ORAL | Status: DC

## 2016-03-23 MED ORDER — ESCITALOPRAM OXALATE 5 MG PO TABS: 5.0000 mg | ORAL_TABLET | Freq: Every day | ORAL | Status: DC

## 2016-03-23 MED ORDER — ARIPIPRAZOLE 2 MG PO TABS: 2.0000 mg | ORAL_TABLET | Freq: Two times a day (BID) | ORAL | Status: DC

## 2016-03-23 MED ORDER — WARFARIN SODIUM 3 MG PO TABS: 6.0000 mg | ORAL_TABLET | Freq: Once | ORAL | Status: DC

## 2016-03-23 MED ORDER — FUROSEMIDE 80 MG PO TABS: 80.0000 mg | ORAL_TABLET | Freq: Two times a day (BID) | ORAL | Status: DC

## 2016-03-23 MED FILL — WARFARIN SODIUM 6 MG TABLET: 6 | 30 days supply | Qty: 30 | Fill #0

## 2016-03-23 MED FILL — ATORVASTATIN 40 MG TABLET: 40 | 30 days supply | Qty: 30 | Fill #0

## 2016-03-23 MED FILL — METOPROLOL SUCC ER 100 MG T: 100 | 30 days supply | Qty: 60 | Fill #0

## 2016-03-23 MED FILL — DIGITEK 125 MCG TABLET: 125 | 30 days supply | Qty: 30 | Fill #0

## 2016-03-23 MED FILL — FUROSEMIDE 80 MG TABLET: 80 | 30 days supply | Qty: 60 | Fill #0

## 2016-03-23 NOTE — Clinical Social Work Note (Signed)
RNCM informed CSW that the patient was having transportation issues. CSW met with the patient. He reported has called his friend that was going to drive him home and can not get in touch with them. Patient denied having funds to call a taxi. CSW provided the patient's RN with taxi voucher.

## 2016-03-23 NOTE — Discharge Summary (Addendum)
Physician Discharge Summary  DRAVIN LANCE ZOX:[id number] DOB: August 01, 1951 DOA: 03/14/2016  PCP: [name], MD  Admit date: 03/14/2016 Discharge date: 03/23/2016  Time spent: 35 minutes  Recommendations for Outpatient Follow-up:  1. Monitor INR and adjust coumadin accordingly 2. Encourage patient to take antidepressant medication   Discharge Diagnoses:  Principal Problem:   Noncompliance with medication regimen Active Problems:   Swelling of lower extremity   Depression   Type 2 diabetes, uncontrolled, with renal manifestation (HCC)   CKD (chronic kidney disease), stage III   HTN (hypertension)   Elevated troponin   Acute on chronic systolic and diastolic heart failure, NYHA class 3 (HCC)   Left atrial thrombus (HCC)   Atrial fibrillation with rapid ventricular response (HCC)   DNR (do not resuscitate)   Palliative care encounter   Discharge Condition: stable  Diet recommendation: heart healthy/diabetic diet  Filed Weights   03/21/16 0526 03/22/16 0503 03/23/16 0604  Weight: 98.068 kg (216 lb 3.2 oz) 97.841 kg (215 lb 11.2 oz) 98.022 kg (216 lb 1.6 oz)    History of present illness:  [name] is a 65 y.o. male with history of uncontrolled type 2 diabetes, chronic kidney disease stage III, morbid obesity, chronic lower extremity edema, hypertension, chronic systolic heart failure EF 20-25%, obstructive sleep apnea, atrial fibrillation, noncompliant with his coumadin as well as his other anti-arrhythmics presented to the emergency room with worsening lower extremity edema and abdominal bloating. He denied chest pain or palpitations.He was hospitalized in February 2017 with atrial fibrillation with RVR and had transesophageal echocardiogram on 2/27. He was found to have a LA thrombus. His DCCV cardioversion was canceled. History was difficult to obtain, numerous tangental thought processes.   Since admission, his A. fib with RVR has resolved. His  echocardiogram demonstrates persistent apical thrombus. He has been diuresing well on Lasix 40 mg IV twice a day. Unfortunately, he frankly admits that he will be noncompliant with his medications at home. He has been seen by psychiatry who has deemed him able to make his own medical decisions. Palliative care consulted because of the patient's ongoing passive suicidal ideation and goal of comfort.  Hospital Course:   Atrial fibrillation with RVR, CHAD2s- Vasc score 4 (htn, dm, age, chf). Diltiazem contraindicated in the setting of systolic heart failure.  - Continue metoprolol  - Continue digoxin - Continue warfarin  Acute on chronic systolic congestive heart failure, biventricular failure, echo 12/30/15 ef 20-25%, currently 15-20% with blunted IVC. Chest x-ray was clear.  - Continue BB - Will continue ARB given normalization of S creatinine - Continue lasix at home dose regimen - Wt stable, -15L   Left Ventricular 1.9 x 2.1cm apical thrombus that is partially mobile - d/c on coumadin. INR therapeutic - Monitor for strokes  Elevated troponins, chest pain free in setting of CKD, a-fib with RVR, and heart failure. Possible CAD. - Due to medication noncompliance and CKD, not a good candidate for heart catheterization so will medically manage - BB, and high dose statin. Recommend patient f/u with cardiologist on d/c  Depression/psychosocial issues/noncompliance -  - Psychiatry assistance appreciated: Patient HAS CAPACITY TO MAKE HIS MEDICAL CONDITIONS - Resume lexapro and abilify - SW and CM. Unlikely that he could afford ALF. d/c with Hospice care  Diabetes mellitus, type II, diet controlled CBG stable - Hemoglobin A1c 6 -Continue SSI - Diabetic diet and will recommend patient f/u with primary care physician  Acute on CKD stage 3, likely due to cardiorenal  syndrome - stable  Essential hypertension, blood pressure low normal - Continue Lasix,  metoprolol  Procedures:  None  Consultations:  Palliative care  Psychiatry  Discharge Exam: Filed Vitals:   03/23/16 0604 03/23/16 0921  BP: 147/105 138/93  Pulse: 64 103  Temp: 97.4 F (36.3 C)   Resp: 18     General: Pt in nad, alert and awake Cardiovascular: Irrr, no rubs Respiratory: no increased wob, no wheezes  Discharge Instructions   Discharge Instructions    Call MD for:  extreme fatigue    Complete by:  As directed      Call MD for:  redness, tenderness, or signs of infection (pain, swelling, redness, odor or green/yellow discharge around incision site)    Complete by:  As directed      Call MD for:  temperature >100.4    Complete by:  As directed      Diet - low sodium heart healthy    Complete by:  As directed      Discharge instructions    Complete by:  As directed   Please be sure to follow up with your primary care physician to have your INR reassessed within the next week.     Increase activity slowly    Complete by:  As directed           Current Discharge Medication List    START taking these medications   Details  ARIPiprazole (ABILIFY) 2 MG tablet Take 1 tablet (2 mg total) by mouth 2 (two) times daily. Qty: 60 tablet, Refills: 0    atorvastatin (LIPITOR) 40 MG tablet Take 1 tablet (40 mg total) by mouth daily at 6 PM. Qty: 30 tablet, Refills: 0    escitalopram (LEXAPRO) 5 MG tablet Take 1 tablet (5 mg total) by mouth daily. Qty: 30 tablet, Refills: 0      CONTINUE these medications which have CHANGED   Details  warfarin (COUMADIN) 6 MG tablet Take 1 tablet (6 mg total) by mouth one time only at 6 PM. Qty: 30 tablet, Refills: 0      CONTINUE these medications which have NOT CHANGED   Details  digoxin (LANOXIN) 0.125 MG tablet Take 1 tablet (0.125 mg total) by mouth daily. Qty: 30 tablet, Refills: 0    furosemide (LASIX) 80 MG tablet Take 1 tablet (80 mg total) by mouth 2 (two) times daily. Qty: 60 tablet, Refills: 0     losartan (COZAAR) 25 MG tablet Take 1 tablet (25 mg total) by mouth daily. Qty: 30 tablet, Refills: 0    metoprolol succinate (TOPROL-XL) 100 MG 24 hr tablet Take 1 tablet (100 mg total) by mouth 2 (two) times daily. Take with or immediately following a meal. Qty: 60 tablet, Refills: 0       No Known Allergies Follow-up Information    Follow up with [facility].   Specialty:  Hospice Services   Why:  Home Hospice arranged   Contact information:   [address] [place] [address] [phone]        The results of significant diagnostics from this hospitalization (including imaging, microbiology, ancillary and laboratory) are listed below for reference.    Significant Diagnostic Studies: Dg Chest 2 View  03/14/2016  CLINICAL DATA:  Chronic shortness of breath. EXAM: CHEST  2 VIEW COMPARISON:  12/28/2015 FINDINGS: Both lungs are clear. Heart and mediastinum are within normal limits. No pleural effusions. Degenerative changes in the thoracic spine. IMPRESSION: No acute  cardiopulmonary disease. Electronically Signed   By: Richarda Overlie M.D.   On: 03/14/2016 14:06    Microbiology: Recent Results (from the past 240 hour(s))  MRSA PCR Screening     Status: None   Collection Time: 03/14/16  8:30 PM  Result Value Ref Range Status   MRSA by PCR NEGATIVE NEGATIVE Final    Comment:        The GeneXpert MRSA Assay (FDA approved for NASAL specimens only), is one component of a comprehensive MRSA colonization surveillance program. It is not intended to diagnose MRSA infection nor to guide or monitor treatment for MRSA infections.      Labs: Basic Metabolic Panel:  Recent Labs Lab 03/19/16 0344 03/20/16 0432 03/21/16 0504 03/22/16 0308 03/23/16 0505  NA 141 142 143 143 140  K 3.6 3.4* 3.8 3.4* 3.4*  CL 100* 100* 100* 100* 99*  CO2 GLUCOSE 98 108* 139* 110* 110*  BUN 27* 24* 22* 22* 18  CREATININE 1.44* 1.42* 1.23 1.12 1.20  CALCIUM 8.5*  8.3* 8.6* 8.4* 8.6*   Liver Function Tests: No results for input(s): AST, ALT, ALKPHOS, BILITOT, PROT, ALBUMIN in the last 168 hours. No results for input(s): LIPASE, AMYLASE in the last 168 hours. No results for input(s): AMMONIA in the last 168 hours. CBC:  Recent Labs Lab 03/17/16 0218 03/18/16 0408 03/19/16 0344 03/20/16 0432 03/23/16 0505  WBC 6.4 7.4 6.2 5.1 6.0  HGB 12.0* 12.0* 11.8* 11.6* 12.3*  HCT 39.7 39.2 39.9 37.6* 39.7  MCV 89.6 89.7 90.1 91.9 90.4  PLT 156 149* 141* 139* 116*   Cardiac Enzymes: No results for input(s): CKTOTAL, CKMB, CKMBINDEX, TROPONINI in the last 168 hours. BNP: BNP (last 3 results)  Recent Labs  12/28/15 1449  BNP 886.8*    ProBNP (last 3 results) No results for input(s): PROBNP in the last 8760 hours.  CBG:  Recent Labs Lab 03/18/16 0257  GLUCAP 119*    Signed:  Penny Pia MD.  [facility] 03/23/2016, 11:47 AM

## 2016-03-23 NOTE — Care Management Note (Signed)
Case Management Note Donn Pierini RN, BSN Unit 2W-Case Manager [phone]  Patient Details  Name: [name] MRN: [id number] Date of Birth: [birth year]  Subjective/Objective:    Pt admitted with HF/noncompliance with medication/depression                Action/Plan: PTA pt lived at home - referral received for home hospice  - went in to speak with pt at bedside- choice for home hospice discussed and list offered-pt states that he wants to use [facility]- spoke about any DME needs- pt states that he does not know of any- asked pt if he had a ride home- reports that he "might but that he took the bus home last time" - pt appears with a flat affect and is not very forth coming in speaking about d/c plans.   Referral has been given to Bambi with [facility] for home hospice needs- Bambi on unit to see pt in room. CM to continue to follow for d/c needs.   Expected Discharge Date:  03/23/16               Expected Discharge Plan:  Home w Hospice Care  In-House Referral:     Discharge planning Services  CM Consult  Post Acute Care Choice:  Hospice Choice offered to:  Patient  DME Arranged:    DME Agency:     HH Arranged:  Disease Management HH Agency:  Other - See comment  Status of Service:  Completed, signed off  Medicare Important Message Given:  Yes Date Medicare IM Given:    Medicare IM give by:    Date Additional Medicare IM Given:    Additional Medicare Important Message give by:     If discussed at Long Length of Stay Meetings, dates discussed:    Discharge Disposition: Home/home hospice   Additional Comments:  03/23/16- 1100- Donn Pierini RN, BSN- [facility] with both [name] and [name]   [name], RN 03/23/2016, 12:19 PM

## 2016-03-23 NOTE — Progress Notes (Signed)
ANTICOAGULATION CONSULT NOTE - Follow Up Consult  Pharmacy Consult for Lovenox and Coumadin Indication: atrial fibrillation and LV thrombus  No Known Allergies  Patient Measurements: Height: 5\' 8"  (172.7 cm) Weight: 216 lb 1.6 oz (98.022 kg) IBW/kg (Calculated) : 68.4  Vital Signs: Temp: 97.4 F (36.3 C) (05/15 0604) Temp Source: Oral (05/15 0604) BP: 138/93 mmHg (05/15 0921) Pulse Rate: 103 (05/15 0921)  Labs:  Recent Labs  03/21/16 0504 03/22/16 0308 03/23/16 0505  HGB  --   --  12.3*  HCT  --   --  39.7  PLT  --   --  116*  LABPROT 18.9* 21.7* 23.2*  INR 1.58* 1.90* 2.08*  CREATININE 1.23 1.12 1.20    Estimated Creatinine Clearance: 69.6 mL/min (by C-G formula based on Cr of 1.2).  Assessment: 64 YOM on warfarin for history of AFib prior to admission but non-compliant, admit INR low at 1.57. Now with partially mobile apical thrombus noted on echo 5/7. INR is now therapeutic at 2.08. CBC is stable and no bleeding noted.   Prior home warfarin regimen: 6 mg daily   Goal of Therapy:  INR 2-3 Anti-Xa level 0.6-1 units/ml 4hrs after LMWH dose given Monitor platelets by anticoagulation protocol: Yes   Plan:  - Consider DC lovenox - Warfarin 6mg  PO x 1 tonight - Daily INR and CBC  Lysle Pearl, PharmD, BCPS Pager # [phone] 03/23/2016 11:47 AM

## 2016-03-23 NOTE — Care Management Note (Signed)
Case Management Note Donn Pierini RN, BSN Unit 2W-Case Manager [phone]  Patient Details  Name: [name] MRN: [id number] Date of Birth: Mar 31, 1951  Subjective/Objective:    Pt admitted with HF/noncompliance with medication/depression                Action/Plan: PTA pt lived at home - referral received for home hospice  - went in to speak with pt at bedside- choice for home hospice discussed and list offered-pt states that he wants to use [facility]- spoke about any DME needs- pt states that he does not know of any- asked pt if he had a ride home- reports that he "might but that he took the bus home last time" - pt appears with a flat affect and is not very forth coming in speaking about d/c plans.   Referral has been given to Bambi with [facility] for home hospice needs- Bambi on unit to see pt in room. CM to continue to follow for d/c needs.   Expected Discharge Date:  03/23/16               Expected Discharge Plan:  Home w Hospice Care  In-House Referral:     Discharge planning Services  CM Consult, MATCH Program, Medication Assistance  Post Acute Care Choice:  Hospice Choice offered to:  Patient  DME Arranged:    DME Agency:     HH Arranged:  Disease Management HH Agency:  Other - See comment  Status of Service:  Completed, signed off  Medicare Important Message Given:  Yes Date Medicare IM Given:    Medicare IM give by:    Date Additional Medicare IM Given:    Additional Medicare Important Message give by:     If discussed at Long Length of Stay Meetings, dates discussed:    Discharge Disposition: Home/home hospice   Additional Comments:  03/23/16- 1500- Donn Pierini RN, BSN- discharging RN came to say that pt states he will not be able to afford medications- call made to [facility] outpt pharmacy to check on pt's medication cost and coverage per pharmacy pt does not have medication part D coverage- spoke with pt at bedside who showed this  CM his insurance cards- he just has Medicare A/B- pt states that he has difficulty getting his meds sometimes because of cost and he does not have Part D- looked pt up in [facility] system to see if he had been assisted in past- last time MATCH used was July of 2015- will assist pt again with [facility] for medication assistance- MATCH letter given to pt at bedside and explained program and cost of meds = $3 per script- pt plans to walk over to pharmacy and then call for cab ride from there.   03/23/16- 1100- Donn Pierini RN, BSN- spoke with both Bambi and Daniel[facility] with [facility]   [name], RN 03/23/2016, 3:46 PM

## 2016-03-23 NOTE — Care Management Important Message (Signed)
Important Message  Patient Details  Name: [name] MRN: [id number] Date of Birth: 06-03-1951   Medicare Important Message Given:  Yes    [name], RN 03/23/2016, 2:03 PM

## 2016-04-22 ENCOUNTER — Telehealth (HOSPITAL_COMMUNITY): Payer: Self-pay | Admitting: *Deleted

## 2016-04-22 NOTE — Telephone Encounter (Signed)
Pt called to report he is out of his meds, he states he has enough for tonight and then he will be out.  Advised he has not been seen here since 2015 and he was sch for March 2017 but cancelled that appt.  He states he is trying to do better now, has been taking his medications he was given at discharge 5/15 and will come to an appt.  I spent 30 minutes on the phone with pt discussing meds and importance of compliance.  I have sent in prescriptions for Losartan, Metoprolol, Digoxin and Furosemide.  I have made pt an appt for 7/6 and a [facility] appt for tomorrow, he is agreeable to attend both appts.  Advised if he does not go to cvrr they will not refill coumadin, and he is only given a 30 day supply of other meds with no refills.

## 2016-04-23 ENCOUNTER — Other Ambulatory Visit (HOSPITAL_COMMUNITY): Payer: Self-pay | Admitting: *Deleted

## 2016-04-23 ENCOUNTER — Ambulatory Visit (INDEPENDENT_AMBULATORY_CARE_PROVIDER_SITE_OTHER): Payer: Medicare Other

## 2016-04-23 DIAGNOSIS — Z5181 Encounter for therapeutic drug level monitoring: Secondary | ICD-10-CM

## 2016-04-23 DIAGNOSIS — I4891 Unspecified atrial fibrillation: Secondary | ICD-10-CM

## 2016-04-23 DIAGNOSIS — I4892 Unspecified atrial flutter: Secondary | ICD-10-CM | POA: Diagnosis not present

## 2016-04-23 DIAGNOSIS — I513 Intracardiac thrombosis, not elsewhere classified: Secondary | ICD-10-CM

## 2016-04-23 DIAGNOSIS — I8290 Acute embolism and thrombosis of unspecified vein: Secondary | ICD-10-CM | POA: Diagnosis not present

## 2016-04-23 DIAGNOSIS — I213 ST elevation (STEMI) myocardial infarction of unspecified site: Secondary | ICD-10-CM | POA: Diagnosis not present

## 2016-04-23 LAB — POCT INR: INR: 2.8

## 2016-04-23 MED ORDER — METOPROLOL SUCCINATE ER 100 MG PO TB24: 100.0000 mg | ORAL_TABLET | Freq: Two times a day (BID) | ORAL | Status: DC

## 2016-04-23 MED ORDER — LOSARTAN POTASSIUM 25 MG PO TABS: 25.0000 mg | ORAL_TABLET | Freq: Every day | ORAL | Status: DC

## 2016-04-23 MED ORDER — WARFARIN SODIUM 6 MG PO TABS: ORAL_TABLET | ORAL | Status: DC

## 2016-04-23 MED ORDER — DIGOXIN 125 MCG PO TABS: 0.1250 mg | ORAL_TABLET | Freq: Every day | ORAL | Status: DC

## 2016-04-23 MED ORDER — FUROSEMIDE 80 MG PO TABS: 80.0000 mg | ORAL_TABLET | Freq: Two times a day (BID) | ORAL | Status: DC

## 2016-04-23 MED FILL — LOSARTAN POTASSIUM 25 MG TA: 25 | 30 days supply | Qty: 30 | Fill #0

## 2016-04-23 MED FILL — DIGITEK 125 MCG TABLET: 125 | 30 days supply | Qty: 30 | Fill #0

## 2016-04-23 MED FILL — METOPROLOL SUCC ER 100 MG T: 100 | 30 days supply | Qty: 60 | Fill #0

## 2016-04-23 MED FILL — WARFARIN SODIUM 6 MG TABLET: 6 | 30 days supply | Qty: 30 | Fill #0

## 2016-04-23 MED FILL — FUROSEMIDE 80 MG TABLET: 80 | 30 days supply | Qty: 60 | Fill #0

## 2016-05-01 ENCOUNTER — Emergency Department (HOSPITAL_COMMUNITY)
Admission: EM | Admit: 2016-05-01 | Discharge: 2016-05-01 | Disposition: A | Discharge status: Home / Self Care | Payer: Medicare Other | Attending: Emergency Medicine | Admitting: Emergency Medicine

## 2016-05-01 ENCOUNTER — Encounter (HOSPITAL_COMMUNITY): Payer: Self-pay | Admitting: *Deleted

## 2016-05-01 ENCOUNTER — Emergency Department (HOSPITAL_COMMUNITY): Payer: Medicare Other

## 2016-05-01 DIAGNOSIS — W260XXA Contact with knife, initial encounter: Secondary | ICD-10-CM | POA: Insufficient documentation

## 2016-05-01 DIAGNOSIS — T148XXA Other injury of unspecified body region, initial encounter: Secondary | ICD-10-CM

## 2016-05-01 DIAGNOSIS — S1191XA Laceration without foreign body of unspecified part of neck, initial encounter: Secondary | ICD-10-CM | POA: Diagnosis not present

## 2016-05-01 DIAGNOSIS — Y939 Activity, unspecified: Secondary | ICD-10-CM | POA: Insufficient documentation

## 2016-05-01 DIAGNOSIS — Z7901 Long term (current) use of anticoagulants: Secondary | ICD-10-CM | POA: Insufficient documentation

## 2016-05-01 DIAGNOSIS — I509 Heart failure, unspecified: Secondary | ICD-10-CM | POA: Diagnosis not present

## 2016-05-01 DIAGNOSIS — R030 Elevated blood-pressure reading, without diagnosis of hypertension: Secondary | ICD-10-CM | POA: Diagnosis not present

## 2016-05-01 DIAGNOSIS — S1093XA Contusion of unspecified part of neck, initial encounter: Secondary | ICD-10-CM | POA: Diagnosis not present

## 2016-05-01 DIAGNOSIS — Y92009 Unspecified place in unspecified non-institutional (private) residence as the place of occurrence of the external cause: Secondary | ICD-10-CM | POA: Insufficient documentation

## 2016-05-01 DIAGNOSIS — Y999 Unspecified external cause status: Secondary | ICD-10-CM | POA: Insufficient documentation

## 2016-05-01 DIAGNOSIS — I130 Hypertensive heart and chronic kidney disease with heart failure and stage 1 through stage 4 chronic kidney disease, or unspecified chronic kidney disease: Secondary | ICD-10-CM | POA: Diagnosis not present

## 2016-05-01 DIAGNOSIS — E119 Type 2 diabetes mellitus without complications: Secondary | ICD-10-CM | POA: Diagnosis not present

## 2016-05-01 DIAGNOSIS — N189 Chronic kidney disease, unspecified: Secondary | ICD-10-CM | POA: Insufficient documentation

## 2016-05-01 DIAGNOSIS — S1181XA Laceration without foreign body of other specified part of neck, initial encounter: Secondary | ICD-10-CM | POA: Diagnosis not present

## 2016-05-01 DIAGNOSIS — S1183XA Puncture wound without foreign body of other specified part of neck, initial encounter: Secondary | ICD-10-CM | POA: Diagnosis present

## 2016-05-01 DIAGNOSIS — S1190XA Unspecified open wound of unspecified part of neck, initial encounter: Secondary | ICD-10-CM | POA: Diagnosis not present

## 2016-05-01 DIAGNOSIS — Z79899 Other long term (current) drug therapy: Secondary | ICD-10-CM | POA: Diagnosis not present

## 2016-05-01 DIAGNOSIS — D62 Acute posthemorrhagic anemia: Secondary | ICD-10-CM | POA: Diagnosis not present

## 2016-05-01 DIAGNOSIS — S199XXA Unspecified injury of neck, initial encounter: Secondary | ICD-10-CM | POA: Diagnosis not present

## 2016-05-01 HISTORY — DX: Other ascites: R18.8

## 2016-05-01 HISTORY — DX: Essential (primary) hypertension: I10

## 2016-05-01 HISTORY — DX: Type 2 diabetes mellitus without complications: E11.9

## 2016-05-01 HISTORY — DX: Chronic kidney disease, unspecified: N18.9

## 2016-05-01 HISTORY — DX: Heart failure, unspecified: I50.9

## 2016-05-01 LAB — TYPE AND SCREEN
ABO/RH(D): A POS
Antibody Screen: NEGATIVE
UNIT DIVISION: 0
Unit division: 0

## 2016-05-01 LAB — CBC
HCT: 35.2 % — ABNORMAL LOW (ref 39.0–52.0)
Hemoglobin: 10.6 g/dL — ABNORMAL LOW (ref 13.0–17.0)
MCH: 26.8 pg (ref 26.0–34.0)
MCHC: 30.1 g/dL (ref 30.0–36.0)
MCV: 88.9 fL (ref 78.0–100.0)
Platelets: 159 10*3/uL (ref 150–400)
RBC: 3.96 MIL/uL — ABNORMAL LOW (ref 4.22–5.81)
RDW: 16.5 % — AB (ref 11.5–15.5)
WBC: 6.2 10*3/uL (ref 4.0–10.5)

## 2016-05-01 LAB — COMPREHENSIVE METABOLIC PANEL
ALBUMIN: 3.9 g/dL (ref 3.5–5.0)
ALK PHOS: 40 U/L (ref 38–126)
ALT: 33 U/L (ref 17–63)
ANION GAP: 7 (ref 5–15)
AST: 32 U/L (ref 15–41)
BILIRUBIN TOTAL: 0.6 mg/dL (ref 0.3–1.2)
BUN: 31 mg/dL — AB (ref 6–20)
CALCIUM: 9.1 mg/dL (ref 8.9–10.3)
CO2: 27 mmol/L (ref 22–32)
Chloride: 106 mmol/L (ref 101–111)
Creatinine, Ser: 1.61 mg/dL — ABNORMAL HIGH (ref 0.61–1.24)
GFR calc Af Amer: 50 mL/min — ABNORMAL LOW (ref >60)
GFR, EST NON AFRICAN AMERICAN: 43 mL/min — AB (ref >60)
GLUCOSE: 199 mg/dL — AB (ref 65–99)
Potassium: 3.4 mmol/L — ABNORMAL LOW (ref 3.5–5.1)
Sodium: 140 mmol/L (ref 135–145)
TOTAL PROTEIN: 7.5 g/dL (ref 6.5–8.1)

## 2016-05-01 LAB — PREPARE FRESH FROZEN PLASMA
UNIT DIVISION: 0
Unit division: 0

## 2016-05-01 LAB — URINALYSIS, ROUTINE W REFLEX MICROSCOPIC
BILIRUBIN URINE: NEGATIVE
Glucose, UA: NEGATIVE mg/dL
HGB URINE DIPSTICK: NEGATIVE
KETONES UR: NEGATIVE mg/dL
Leukocytes, UA: NEGATIVE
NITRITE: NEGATIVE
PROTEIN: NEGATIVE mg/dL
Specific Gravity, Urine: 1.019 (ref 1.005–1.030)
pH: 7.5 (ref 5.0–8.0)

## 2016-05-01 LAB — I-STAT CHEM 8, ED
BUN: 33 mg/dL — ABNORMAL HIGH (ref 6–20)
CALCIUM ION: 1.09 mmol/L — AB (ref 1.13–1.30)
Chloride: 102 mmol/L (ref 101–111)
Creatinine, Ser: 1.5 mg/dL — ABNORMAL HIGH (ref 0.61–1.24)
Glucose, Bld: 201 mg/dL — ABNORMAL HIGH (ref 65–99)
HEMATOCRIT: 34 % — AB (ref 39.0–52.0)
HEMOGLOBIN: 11.6 g/dL — AB (ref 13.0–17.0)
Potassium: 3.5 mmol/L (ref 3.5–5.1)
SODIUM: 143 mmol/L (ref 135–145)
TCO2: 29 mmol/L (ref 0–100)

## 2016-05-01 LAB — ETHANOL

## 2016-05-01 LAB — BLOOD PRODUCT ORDER (VERBAL) VERIFICATION

## 2016-05-01 LAB — I-STAT CG4 LACTIC ACID, ED: LACTIC ACID, VENOUS: 2.33 mmol/L — AB (ref 0.5–2.0)

## 2016-05-01 LAB — ABO/RH: ABO/RH(D): A POS

## 2016-05-01 LAB — HEMOGLOBIN AND HEMATOCRIT, BLOOD
HCT: 32.7 % — ABNORMAL LOW (ref 39.0–52.0)
HEMOGLOBIN: 10 g/dL — AB (ref 13.0–17.0)

## 2016-05-01 LAB — PROTIME-INR
INR: 3 — ABNORMAL HIGH (ref 0.00–1.49)
Prothrombin Time: 30.6 seconds — ABNORMAL HIGH (ref 11.6–15.2)

## 2016-05-01 LAB — CDS SEROLOGY

## 2016-05-01 MED ORDER — CEFAZOLIN SODIUM-DEXTROSE 2-4 GM/100ML-% IV SOLN
INTRAVENOUS | Status: AC
  Filled 2016-05-01: qty 100

## 2016-05-01 MED ORDER — CEFAZOLIN SODIUM-DEXTROSE 2-4 GM/100ML-% IV SOLN
2.0000 g | Freq: Once | INTRAVENOUS | Status: AC
  Administered 2016-05-01: 2 g via INTRAVENOUS

## 2016-05-01 MED ORDER — OXYCODONE-ACETAMINOPHEN 5-325 MG PO TABS: 1.0000 | ORAL_TABLET | ORAL | Status: DC | PRN

## 2016-05-01 MED ORDER — LIDOCAINE-EPINEPHRINE 1 %-1:100000 IJ SOLN
20.0000 mL | Freq: Once | INTRAMUSCULAR | Status: AC
  Administered 2016-05-01: 20 mL

## 2016-05-01 MED ORDER — IOPAMIDOL (ISOVUE-370) INJECTION 76%
INTRAVENOUS | Status: AC
  Administered 2016-05-01: 50 mL
  Filled 2016-05-01: qty 50

## 2016-05-01 MED ORDER — TETANUS-DIPHTH-ACELL PERTUSSIS 5-2.5-18.5 LF-MCG/0.5 IM SUSP
0.5000 mL | Freq: Once | INTRAMUSCULAR | Status: AC
  Administered 2016-05-01: 0.5 mL via INTRAMUSCULAR

## 2016-05-01 MED ORDER — SODIUM CHLORIDE 0.9 % IV BOLUS (SEPSIS)
500.0000 mL | Freq: Once | INTRAVENOUS | Status: AC
  Administered 2016-05-01: 500 mL via INTRAVENOUS

## 2016-05-01 MED ORDER — TETANUS-DIPHTH-ACELL PERTUSSIS 5-2.5-18.5 LF-MCG/0.5 IM SUSP
INTRAMUSCULAR | Status: AC
  Filled 2016-05-01: qty 0.5

## 2016-05-01 MED ORDER — CEPHALEXIN 500 MG PO CAPS: 500.0000 mg | ORAL_CAPSULE | Freq: Two times a day (BID) | ORAL | Status: DC
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
## 2016-05-01 NOTE — ED Notes (Signed)
Will go get food tray @ 1155: spoke with [place]
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
## 2016-05-01 NOTE — ED Notes (Signed)
Breakfast Tray ordered @ 0725, per Heriberto Antigua.

## 2016-05-01 NOTE — ED Notes (Signed)
Pt. Given a bag lunch, crackers and a diet gingerale.
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
## 2016-05-05 ENCOUNTER — Encounter (HOSPITAL_COMMUNITY): Payer: Self-pay | Admitting: *Deleted

## 2016-05-14 ENCOUNTER — Encounter (HOSPITAL_COMMUNITY): Payer: Self-pay | Admitting: Pharmacist

## 2016-05-14 ENCOUNTER — Other Ambulatory Visit (HOSPITAL_COMMUNITY): Payer: Self-pay | Admitting: *Deleted

## 2016-05-14 ENCOUNTER — Ambulatory Visit (HOSPITAL_COMMUNITY)
Admission: RE | Admit: 2016-05-14 | Discharge: 2016-05-14 | Disposition: A | Discharge status: Home / Self Care | Payer: Medicare Other | Source: Ambulatory Visit | Attending: Internal Medicine | Admitting: Internal Medicine

## 2016-05-14 ENCOUNTER — Ambulatory Visit (INDEPENDENT_AMBULATORY_CARE_PROVIDER_SITE_OTHER): Payer: Medicare Other | Admitting: *Deleted

## 2016-05-14 VITALS — BP 132/95 | HR 96 | Wt 204.0 lb

## 2016-05-14 DIAGNOSIS — Z5181 Encounter for therapeutic drug level monitoring: Secondary | ICD-10-CM

## 2016-05-14 DIAGNOSIS — Z9114 Patient's other noncompliance with medication regimen: Secondary | ICD-10-CM

## 2016-05-14 DIAGNOSIS — I4891 Unspecified atrial fibrillation: Secondary | ICD-10-CM | POA: Insufficient documentation

## 2016-05-14 DIAGNOSIS — I213 ST elevation (STEMI) myocardial infarction of unspecified site: Secondary | ICD-10-CM

## 2016-05-14 DIAGNOSIS — G4733 Obstructive sleep apnea (adult) (pediatric): Secondary | ICD-10-CM | POA: Insufficient documentation

## 2016-05-14 DIAGNOSIS — I4892 Unspecified atrial flutter: Secondary | ICD-10-CM | POA: Diagnosis not present

## 2016-05-14 DIAGNOSIS — Z9119 Patient's noncompliance with other medical treatment and regimen: Secondary | ICD-10-CM | POA: Insufficient documentation

## 2016-05-14 DIAGNOSIS — I513 Intracardiac thrombosis, not elsewhere classified: Secondary | ICD-10-CM

## 2016-05-14 DIAGNOSIS — F329 Major depressive disorder, single episode, unspecified: Secondary | ICD-10-CM | POA: Insufficient documentation

## 2016-05-14 DIAGNOSIS — E1122 Type 2 diabetes mellitus with diabetic chronic kidney disease: Secondary | ICD-10-CM | POA: Insufficient documentation

## 2016-05-14 DIAGNOSIS — N183 Chronic kidney disease, stage 3 (moderate): Secondary | ICD-10-CM | POA: Insufficient documentation

## 2016-05-14 DIAGNOSIS — Z7901 Long term (current) use of anticoagulants: Secondary | ICD-10-CM | POA: Diagnosis not present

## 2016-05-14 DIAGNOSIS — I130 Hypertensive heart and chronic kidney disease with heart failure and stage 1 through stage 4 chronic kidney disease, or unspecified chronic kidney disease: Secondary | ICD-10-CM | POA: Diagnosis not present

## 2016-05-14 DIAGNOSIS — I8290 Acute embolism and thrombosis of unspecified vein: Secondary | ICD-10-CM

## 2016-05-14 DIAGNOSIS — L0211 Cutaneous abscess of neck: Secondary | ICD-10-CM | POA: Insufficient documentation

## 2016-05-14 DIAGNOSIS — M7989 Other specified soft tissue disorders: Secondary | ICD-10-CM

## 2016-05-14 DIAGNOSIS — I5022 Chronic systolic (congestive) heart failure: Secondary | ICD-10-CM | POA: Insufficient documentation

## 2016-05-14 DIAGNOSIS — I110 Hypertensive heart disease with heart failure: Secondary | ICD-10-CM

## 2016-05-14 LAB — BASIC METABOLIC PANEL
ANION GAP: 9 (ref 5–15)
BUN: 35 mg/dL — ABNORMAL HIGH (ref 6–20)
CALCIUM: 9.4 mg/dL (ref 8.9–10.3)
CO2: 27 mmol/L (ref 22–32)
Chloride: 105 mmol/L (ref 101–111)
Creatinine, Ser: 1.31 mg/dL — ABNORMAL HIGH (ref 0.61–1.24)
GFR calc non Af Amer: 56 mL/min — ABNORMAL LOW (ref >60)
Glucose, Bld: 104 mg/dL — ABNORMAL HIGH (ref 65–99)
POTASSIUM: 3.9 mmol/L (ref 3.5–5.1)
Sodium: 141 mmol/L (ref 135–145)

## 2016-05-14 LAB — POCT INR: INR: 4.9

## 2016-05-14 MED ORDER — METOPROLOL SUCCINATE ER 100 MG PO TB24: 100.0000 mg | ORAL_TABLET | Freq: Two times a day (BID) | ORAL | Status: DC

## 2016-05-14 MED ORDER — DIGOXIN 125 MCG PO TABS: 0.1250 mg | ORAL_TABLET | Freq: Every day | ORAL | Status: DC

## 2016-05-14 MED ORDER — SACUBITRIL-VALSARTAN 24-26 MG PO TABS: 1.0000 | ORAL_TABLET | Freq: Two times a day (BID) | ORAL | Status: DC

## 2016-05-14 MED ORDER — ATORVASTATIN CALCIUM 40 MG PO TABS: 40.0000 mg | ORAL_TABLET | Freq: Every day | ORAL | Status: DC

## 2016-05-14 MED ORDER — FUROSEMIDE 80 MG PO TABS: 80.0000 mg | ORAL_TABLET | Freq: Two times a day (BID) | ORAL | Status: DC

## 2016-05-14 MED FILL — METOPROLOL SUCC ER 100 MG T: 100 | 30 days supply | Qty: 60 | Fill #0

## 2016-05-14 MED FILL — FUROSEMIDE 80 MG TABLET: 80 | 30 days supply | Qty: 60 | Fill #0

## 2016-05-14 MED FILL — DIGITEK 125 MCG TABLET: 125 | 30 days supply | Qty: 30 | Fill #0

## 2016-05-14 NOTE — Patient Instructions (Addendum)
Routine lab work today. Will notify you of abnormal results, otherwise no news is good news!  STOP Losartan.  START Entresto 24/26 mg tablet twice daily.  Follow up 2-3 weeks.  Do the following things EVERYDAY: 1) Weigh yourself in the morning before breakfast. Write it down and keep it in a log. 2) Take your medicines as prescribed 3) Eat low salt foods-Limit salt (sodium) to 2000 mg per day.  4) Stay as active as you can everyday 5) Limit all fluids for the day to less than 2 liters
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
## 2016-05-14 NOTE — Progress Notes (Signed)
Advanced Heart Failure Medication Review by a Pharmacist  Does the patient  feel that his/her medications are working for him/her?  yes  Has the patient been experiencing any side effects to the medications prescribed?  no  Does the patient measure his/her own blood pressure or blood glucose at home?  no   Does the patient have any problems obtaining medications due to transportation or finances?   no  Understanding of regimen: fair Understanding of indications: fair Potential of compliance: fair Patient understands to avoid NSAIDs. Patient understands to avoid decongestants.  Issues to address at subsequent visits: None   Pharmacist comments:  Mr. Millam (pronounced "odronic") is a talkative 65 yo M presenting with his medication bottles. He reports good compliance with his regimen and states he takes them like his bottle states.   [name]. Bonnye Fava, PharmD, BCPS, CPP Clinical Pharmacist Pager: [phone] Phone: [phone] 05/14/2016 12:51 PM      Time with patient: 10 minutes Preparation and documentation time: 2 minutes Total time: 12 minutes

## 2016-05-25 ENCOUNTER — Other Ambulatory Visit: Payer: Self-pay | Admitting: *Deleted

## 2016-05-25 MED ORDER — WARFARIN SODIUM 6 MG PO TABS: ORAL_TABLET | ORAL | Status: DC

## 2016-05-25 MED FILL — WARFARIN SODIUM 6 MG TABLET: 6 | 30 days supply | Qty: 30 | Fill #0

## 2016-05-25 NOTE — Telephone Encounter (Signed)
States has an appt to be seen on July 20th but has no more coumadin so refill done as requested

## 2016-05-28 ENCOUNTER — Ambulatory Visit (INDEPENDENT_AMBULATORY_CARE_PROVIDER_SITE_OTHER): Payer: Medicare Other | Admitting: *Deleted

## 2016-05-28 DIAGNOSIS — I4892 Unspecified atrial flutter: Secondary | ICD-10-CM

## 2016-05-28 DIAGNOSIS — Z5181 Encounter for therapeutic drug level monitoring: Secondary | ICD-10-CM | POA: Diagnosis not present

## 2016-05-28 DIAGNOSIS — I213 ST elevation (STEMI) myocardial infarction of unspecified site: Secondary | ICD-10-CM

## 2016-05-28 DIAGNOSIS — I4891 Unspecified atrial fibrillation: Secondary | ICD-10-CM

## 2016-05-28 DIAGNOSIS — I8290 Acute embolism and thrombosis of unspecified vein: Secondary | ICD-10-CM | POA: Diagnosis not present

## 2016-05-28 DIAGNOSIS — I513 Intracardiac thrombosis, not elsewhere classified: Secondary | ICD-10-CM

## 2016-05-28 LAB — POCT INR: INR: 2.1

## 2016-06-04 ENCOUNTER — Ambulatory Visit (HOSPITAL_COMMUNITY)
Admission: RE | Admit: 2016-06-04 | Discharge: 2016-06-04 | Disposition: A | Discharge status: Home / Self Care | Payer: Medicare Other | Source: Ambulatory Visit | Attending: Cardiology | Admitting: Cardiology

## 2016-06-04 VITALS — BP 141/81 | HR 94 | Resp 20 | Wt 209.8 lb

## 2016-06-04 DIAGNOSIS — Z9114 Patient's other noncompliance with medication regimen: Secondary | ICD-10-CM | POA: Diagnosis not present

## 2016-06-04 DIAGNOSIS — I513 Intracardiac thrombosis, not elsewhere classified: Secondary | ICD-10-CM

## 2016-06-04 DIAGNOSIS — Z79899 Other long term (current) drug therapy: Secondary | ICD-10-CM | POA: Insufficient documentation

## 2016-06-04 DIAGNOSIS — Z8249 Family history of ischemic heart disease and other diseases of the circulatory system: Secondary | ICD-10-CM | POA: Insufficient documentation

## 2016-06-04 DIAGNOSIS — I213 ST elevation (STEMI) myocardial infarction of unspecified site: Secondary | ICD-10-CM | POA: Diagnosis not present

## 2016-06-04 DIAGNOSIS — I4891 Unspecified atrial fibrillation: Secondary | ICD-10-CM | POA: Insufficient documentation

## 2016-06-04 DIAGNOSIS — E1122 Type 2 diabetes mellitus with diabetic chronic kidney disease: Secondary | ICD-10-CM | POA: Diagnosis not present

## 2016-06-04 DIAGNOSIS — N183 Chronic kidney disease, stage 3 unspecified: Secondary | ICD-10-CM

## 2016-06-04 DIAGNOSIS — Z7901 Long term (current) use of anticoagulants: Secondary | ICD-10-CM | POA: Insufficient documentation

## 2016-06-04 DIAGNOSIS — I130 Hypertensive heart and chronic kidney disease with heart failure and stage 1 through stage 4 chronic kidney disease, or unspecified chronic kidney disease: Secondary | ICD-10-CM | POA: Insufficient documentation

## 2016-06-04 DIAGNOSIS — Z833 Family history of diabetes mellitus: Secondary | ICD-10-CM | POA: Insufficient documentation

## 2016-06-04 DIAGNOSIS — F329 Major depressive disorder, single episode, unspecified: Secondary | ICD-10-CM | POA: Diagnosis not present

## 2016-06-04 DIAGNOSIS — I10 Essential (primary) hypertension: Secondary | ICD-10-CM

## 2016-06-04 DIAGNOSIS — I5022 Chronic systolic (congestive) heart failure: Secondary | ICD-10-CM | POA: Insufficient documentation

## 2016-06-04 DIAGNOSIS — Z86718 Personal history of other venous thrombosis and embolism: Secondary | ICD-10-CM | POA: Diagnosis not present

## 2016-06-04 DIAGNOSIS — G4733 Obstructive sleep apnea (adult) (pediatric): Secondary | ICD-10-CM | POA: Diagnosis not present

## 2016-06-04 LAB — CBC
HCT: 37.8 % — ABNORMAL LOW (ref 39.0–52.0)
Hemoglobin: 11.6 g/dL — ABNORMAL LOW (ref 13.0–17.0)
MCH: 28.9 pg (ref 26.0–34.0)
MCHC: 30.7 g/dL (ref 30.0–36.0)
MCV: 94 fL (ref 78.0–100.0)
PLATELETS: 195 10*3/uL (ref 150–400)
RBC: 4.02 MIL/uL — AB (ref 4.22–5.81)
RDW: 15.9 % — ABNORMAL HIGH (ref 11.5–15.5)
WBC: 7.2 10*3/uL (ref 4.0–10.5)

## 2016-06-04 LAB — BASIC METABOLIC PANEL
ANION GAP: 7 (ref 5–15)
BUN: 30 mg/dL — ABNORMAL HIGH (ref 6–20)
CALCIUM: 9.4 mg/dL (ref 8.9–10.3)
CO2: 28 mmol/L (ref 22–32)
Chloride: 106 mmol/L (ref 101–111)
Creatinine, Ser: 1.49 mg/dL — ABNORMAL HIGH (ref 0.61–1.24)
GFR calc non Af Amer: 48 mL/min — ABNORMAL LOW (ref >60)
GFR, EST AFRICAN AMERICAN: 55 mL/min — AB (ref >60)
Glucose, Bld: 117 mg/dL — ABNORMAL HIGH (ref 65–99)
Potassium: 4 mmol/L (ref 3.5–5.1)
Sodium: 141 mmol/L (ref 135–145)

## 2016-06-04 LAB — DIGOXIN LEVEL: Digoxin Level: 0.4 ng/mL — ABNORMAL LOW (ref 0.8–2.0)

## 2016-06-04 NOTE — Patient Instructions (Signed)
Routine lab work today. Will notify you of abnormal results, otherwise no news is good news!  Follow up 6 weeks with Dr. Gala Romney.  Do the following things EVERYDAY: 1) Weigh yourself in the morning before breakfast. Write it down and keep it in a log. 2) Take your medicines as prescribed 3) Eat low salt foods-Limit salt (sodium) to 2000 mg per day.  4) Stay as active as you can everyday 5) Limit all fluids for the day to less than 2 liters
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
## 2016-06-09 ENCOUNTER — Telehealth: Payer: Self-pay | Admitting: Licensed Clinical Social Worker

## 2016-06-09 NOTE — Telephone Encounter (Signed)
CSW referred to discuss paramedicine referral with patient. CSW contacted patient and explained the program. Patient appears to be open to the idea although states he would like some time to think about it. CSW will follow up with patient in a week per his request. Lasandra Beech, LCSW [phone]

## 2016-06-18 ENCOUNTER — Encounter (INDEPENDENT_AMBULATORY_CARE_PROVIDER_SITE_OTHER): Payer: Self-pay

## 2016-06-18 ENCOUNTER — Ambulatory Visit (INDEPENDENT_AMBULATORY_CARE_PROVIDER_SITE_OTHER): Payer: Medicare Other | Admitting: *Deleted

## 2016-06-18 DIAGNOSIS — I4891 Unspecified atrial fibrillation: Secondary | ICD-10-CM | POA: Diagnosis not present

## 2016-06-18 DIAGNOSIS — I4892 Unspecified atrial flutter: Secondary | ICD-10-CM

## 2016-06-18 DIAGNOSIS — I8290 Acute embolism and thrombosis of unspecified vein: Secondary | ICD-10-CM

## 2016-06-18 DIAGNOSIS — I213 ST elevation (STEMI) myocardial infarction of unspecified site: Secondary | ICD-10-CM

## 2016-06-18 DIAGNOSIS — Z5181 Encounter for therapeutic drug level monitoring: Secondary | ICD-10-CM

## 2016-06-18 DIAGNOSIS — I513 Intracardiac thrombosis, not elsewhere classified: Secondary | ICD-10-CM

## 2016-06-18 LAB — POCT INR: INR: 2.9

## 2016-06-23 ENCOUNTER — Telehealth (HOSPITAL_COMMUNITY): Payer: Self-pay

## 2016-06-23 ENCOUNTER — Other Ambulatory Visit (HOSPITAL_COMMUNITY): Payer: Self-pay

## 2016-06-23 MED ORDER — FUROSEMIDE 80 MG PO TABS: 80.0000 mg | ORAL_TABLET | Freq: Two times a day (BID) | ORAL | 5 refills | Status: DC

## 2016-06-23 MED ORDER — ATORVASTATIN CALCIUM 40 MG PO TABS: 40.0000 mg | ORAL_TABLET | Freq: Every day | ORAL | 5 refills | Status: DC

## 2016-06-23 MED ORDER — DIGOXIN 125 MCG PO TABS: 0.1250 mg | ORAL_TABLET | Freq: Every day | ORAL | 5 refills | Status: DC

## 2016-06-23 MED ORDER — SACUBITRIL-VALSARTAN 24-26 MG PO TABS: 1.0000 | ORAL_TABLET | Freq: Two times a day (BID) | ORAL | 6 refills | Status: DC

## 2016-06-23 MED ORDER — METOPROLOL SUCCINATE ER 100 MG PO TB24: 100.0000 mg | ORAL_TABLET | Freq: Two times a day (BID) | ORAL | 5 refills | Status: DC

## 2016-06-23 MED FILL — ATORVASTATIN 40 MG TABLET: 40 | 30 days supply | Qty: 30 | Fill #0

## 2016-06-23 MED FILL — FUROSEMIDE 80 MG TABLET: 80 | 30 days supply | Qty: 60 | Fill #0

## 2016-06-23 MED FILL — METOPROLOL SUCC ER 100 MG T: 100 | 30 days supply | Qty: 60 | Fill #0

## 2016-06-23 MED FILL — DIGITEK 125 MCG TABLET: 125 | 30 days supply | Qty: 30 | Fill #0

## 2016-06-23 NOTE — Telephone Encounter (Signed)
Patient needing refills on all heart meds. Refills sent to preferred pharmacy electronically. Verified upcoming apt 9/13 with him.  Ave Filter, RN

## 2016-06-24 MED FILL — WARFARIN SODIUM 6 MG TABLET: 6 | 30 days supply | Qty: 30 | Fill #1

## 2016-06-30 ENCOUNTER — Telehealth (HOSPITAL_COMMUNITY): Payer: Self-pay | Admitting: Pharmacist

## 2016-06-30 NOTE — Telephone Encounter (Signed)
[employer] patient assistance for Sherryll Burger requires a call with Mr. Aird to relay information about Part D options and LIS application through [employer]. They called on 7/26 and patient did not answer. They will call again otherwise he can call [phone].   [name]. Bonnye Fava, PharmD, BCPS, CPP Clinical Pharmacist Pager: [phone] Phone: [phone] 06/30/2016 10:53 AM

## 2016-07-16 ENCOUNTER — Ambulatory Visit (INDEPENDENT_AMBULATORY_CARE_PROVIDER_SITE_OTHER): Payer: Medicare Other | Admitting: *Deleted

## 2016-07-16 ENCOUNTER — Other Ambulatory Visit (HOSPITAL_COMMUNITY): Payer: Self-pay | Admitting: *Deleted

## 2016-07-16 ENCOUNTER — Encounter (INDEPENDENT_AMBULATORY_CARE_PROVIDER_SITE_OTHER): Payer: Self-pay

## 2016-07-16 DIAGNOSIS — I4891 Unspecified atrial fibrillation: Secondary | ICD-10-CM | POA: Diagnosis not present

## 2016-07-16 DIAGNOSIS — I513 Intracardiac thrombosis, not elsewhere classified: Secondary | ICD-10-CM

## 2016-07-16 DIAGNOSIS — Z5181 Encounter for therapeutic drug level monitoring: Secondary | ICD-10-CM

## 2016-07-16 DIAGNOSIS — I213 ST elevation (STEMI) myocardial infarction of unspecified site: Secondary | ICD-10-CM

## 2016-07-16 DIAGNOSIS — I4892 Unspecified atrial flutter: Secondary | ICD-10-CM | POA: Diagnosis not present

## 2016-07-16 DIAGNOSIS — I8290 Acute embolism and thrombosis of unspecified vein: Secondary | ICD-10-CM | POA: Diagnosis not present

## 2016-07-16 LAB — POCT INR: INR: 3.3

## 2016-07-16 MED ORDER — METOPROLOL SUCCINATE ER 100 MG PO TB24: 100.0000 mg | ORAL_TABLET | Freq: Two times a day (BID) | ORAL | 5 refills | Status: DC

## 2016-07-16 MED ORDER — WARFARIN SODIUM 6 MG PO TABS: ORAL_TABLET | ORAL | 1 refills | Status: DC

## 2016-07-16 MED ORDER — ATORVASTATIN CALCIUM 40 MG PO TABS: 40.0000 mg | ORAL_TABLET | Freq: Every day | ORAL | 5 refills | Status: DC

## 2016-07-16 MED ORDER — FUROSEMIDE 80 MG PO TABS: 80.0000 mg | ORAL_TABLET | Freq: Two times a day (BID) | ORAL | 5 refills | Status: DC

## 2016-07-16 MED ORDER — DIGOXIN 125 MCG PO TABS
0.1250 mg | ORAL_TABLET | Freq: Every day | ORAL | 5 refills | Status: DC
Start: 2016-07-16 — End: 2016-12-30

## 2016-07-16 MED FILL — DIGITEK 125 MCG TABLET: 125 | 30 days supply | Qty: 30 | Fill #0

## 2016-07-16 MED FILL — ATORVASTATIN 40 MG TABLET: 40 | 30 days supply | Qty: 30 | Fill #0

## 2016-07-16 MED FILL — METOPROLOL SUCC ER 100 MG T: 100 | 30 days supply | Qty: 60 | Fill #0

## 2016-07-16 MED FILL — FUROSEMIDE 80 MG TABLET: 80 | 30 days supply | Qty: 60 | Fill #0

## 2016-07-22 ENCOUNTER — Ambulatory Visit (HOSPITAL_COMMUNITY)
Admission: RE | Admit: 2016-07-22 | Discharge: 2016-07-22 | Disposition: A | Discharge status: Home / Self Care | Payer: Medicare Other | Source: Ambulatory Visit | Attending: Internal Medicine | Admitting: Internal Medicine

## 2016-07-22 VITALS — BP 136/70 | HR 90 | Wt 223.8 lb

## 2016-07-22 DIAGNOSIS — E1122 Type 2 diabetes mellitus with diabetic chronic kidney disease: Secondary | ICD-10-CM | POA: Diagnosis not present

## 2016-07-22 DIAGNOSIS — I82891 Chronic embolism and thrombosis of other specified veins: Secondary | ICD-10-CM | POA: Diagnosis not present

## 2016-07-22 DIAGNOSIS — I130 Hypertensive heart and chronic kidney disease with heart failure and stage 1 through stage 4 chronic kidney disease, or unspecified chronic kidney disease: Secondary | ICD-10-CM | POA: Insufficient documentation

## 2016-07-22 DIAGNOSIS — Z7901 Long term (current) use of anticoagulants: Secondary | ICD-10-CM | POA: Insufficient documentation

## 2016-07-22 DIAGNOSIS — N183 Chronic kidney disease, stage 3 (moderate): Secondary | ICD-10-CM | POA: Diagnosis not present

## 2016-07-22 DIAGNOSIS — Z9114 Patient's other noncompliance with medication regimen: Secondary | ICD-10-CM | POA: Diagnosis not present

## 2016-07-22 DIAGNOSIS — I4891 Unspecified atrial fibrillation: Secondary | ICD-10-CM | POA: Insufficient documentation

## 2016-07-22 DIAGNOSIS — G4733 Obstructive sleep apnea (adult) (pediatric): Secondary | ICD-10-CM | POA: Diagnosis not present

## 2016-07-22 DIAGNOSIS — I480 Paroxysmal atrial fibrillation: Secondary | ICD-10-CM | POA: Diagnosis not present

## 2016-07-22 DIAGNOSIS — F329 Major depressive disorder, single episode, unspecified: Secondary | ICD-10-CM | POA: Insufficient documentation

## 2016-07-22 DIAGNOSIS — E669 Obesity, unspecified: Secondary | ICD-10-CM | POA: Diagnosis not present

## 2016-07-22 DIAGNOSIS — Z79899 Other long term (current) drug therapy: Secondary | ICD-10-CM | POA: Insufficient documentation

## 2016-07-22 DIAGNOSIS — I213 ST elevation (STEMI) myocardial infarction of unspecified site: Secondary | ICD-10-CM | POA: Diagnosis not present

## 2016-07-22 DIAGNOSIS — I513 Intracardiac thrombosis, not elsewhere classified: Secondary | ICD-10-CM

## 2016-07-22 DIAGNOSIS — I5022 Chronic systolic (congestive) heart failure: Secondary | ICD-10-CM | POA: Diagnosis not present

## 2016-07-22 LAB — BRAIN NATRIURETIC PEPTIDE: B NATRIURETIC PEPTIDE 5: 240.8 pg/mL — AB (ref 0.0–100.0)

## 2016-07-22 LAB — BASIC METABOLIC PANEL
ANION GAP: 8 (ref 5–15)
BUN: 23 mg/dL — ABNORMAL HIGH (ref 6–20)
CHLORIDE: 104 mmol/L (ref 101–111)
CO2: 28 mmol/L (ref 22–32)
CREATININE: 1.36 mg/dL — AB (ref 0.61–1.24)
Calcium: 9.2 mg/dL (ref 8.9–10.3)
GFR calc non Af Amer: 53 mL/min — ABNORMAL LOW (ref >60)
Glucose, Bld: 152 mg/dL — ABNORMAL HIGH (ref 65–99)
POTASSIUM: 3.6 mmol/L (ref 3.5–5.1)
SODIUM: 140 mmol/L (ref 135–145)

## 2016-07-22 LAB — DIGOXIN LEVEL: DIGOXIN LVL: 0.5 ng/mL — AB (ref 0.8–2.0)

## 2016-07-22 MED ORDER — POTASSIUM CHLORIDE ER 20 MEQ PO TBCR: EXTENDED_RELEASE_TABLET | ORAL | 0 refills | Status: DC

## 2016-07-22 MED ORDER — METOLAZONE 5 MG PO TABS: ORAL_TABLET | ORAL | 0 refills | Status: DC

## 2016-07-22 MED FILL — metOLazone 5 MG TABS: 5 | 3 days supply | Qty: 3 | Fill #0

## 2016-07-22 MED FILL — KLOR-CON M20 TABLET: 20 | 5 days supply | Qty: 10 | Fill #0
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
## 2016-07-22 NOTE — Patient Instructions (Signed)
Take Metolazone 5mg  daily for three days.  Take 40 meq (2 tablets) of potassium with Metolazone.  Routine lab work today. Will notify you of abnormal results  Follow up in 2 weeks.

## 2016-07-24 MED FILL — WARFARIN SODIUM 6 MG TABLET: 6 | 30 days supply | Qty: 30 | Fill #0

## 2016-07-27 ENCOUNTER — Encounter (HOSPITAL_COMMUNITY): Payer: Self-pay

## 2016-07-27 MED ORDER — METOLAZONE 5 MG PO TABS: ORAL_TABLET | ORAL | 0 refills | Status: DC

## 2016-07-27 MED ORDER — POTASSIUM CHLORIDE ER 20 MEQ PO TBCR: EXTENDED_RELEASE_TABLET | ORAL | 0 refills | Status: DC

## 2016-07-27 NOTE — Progress Notes (Signed)
Patient walked in to CHF clinic stating he was not able to start his 3 day course of metolazone and potassium as prescribed by Dr. Gala Romney because he is out of funds. Sent to HF fund for this months coverage.  Ave Filter, RN

## 2016-08-04 ENCOUNTER — Encounter (HOSPITAL_COMMUNITY): Payer: Self-pay

## 2016-08-13 ENCOUNTER — Ambulatory Visit (INDEPENDENT_AMBULATORY_CARE_PROVIDER_SITE_OTHER): Payer: Medicare Other | Admitting: *Deleted

## 2016-08-13 DIAGNOSIS — Z5181 Encounter for therapeutic drug level monitoring: Secondary | ICD-10-CM

## 2016-08-13 DIAGNOSIS — I8290 Acute embolism and thrombosis of unspecified vein: Secondary | ICD-10-CM | POA: Diagnosis not present

## 2016-08-13 DIAGNOSIS — I4891 Unspecified atrial fibrillation: Secondary | ICD-10-CM | POA: Diagnosis not present

## 2016-08-13 DIAGNOSIS — I4892 Unspecified atrial flutter: Secondary | ICD-10-CM | POA: Diagnosis not present

## 2016-08-13 LAB — POCT INR: INR: 3.3

## 2016-08-20 ENCOUNTER — Inpatient Hospital Stay (HOSPITAL_COMMUNITY): Admission: RE | Admit: 2016-08-20 | Payer: Self-pay | Source: Ambulatory Visit

## 2016-08-27 ENCOUNTER — Ambulatory Visit (HOSPITAL_COMMUNITY)
Admission: RE | Admit: 2016-08-27 | Discharge: 2016-08-27 | Disposition: A | Discharge status: Home / Self Care | Payer: Medicare Other | Source: Ambulatory Visit | Attending: Cardiology | Admitting: Cardiology

## 2016-08-27 VITALS — BP 204/106 | HR 108 | Wt 219.4 lb

## 2016-08-27 DIAGNOSIS — F329 Major depressive disorder, single episode, unspecified: Secondary | ICD-10-CM | POA: Diagnosis not present

## 2016-08-27 DIAGNOSIS — G4733 Obstructive sleep apnea (adult) (pediatric): Secondary | ICD-10-CM | POA: Diagnosis not present

## 2016-08-27 DIAGNOSIS — E1122 Type 2 diabetes mellitus with diabetic chronic kidney disease: Secondary | ICD-10-CM

## 2016-08-27 DIAGNOSIS — Z9119 Patient's noncompliance with other medical treatment and regimen: Secondary | ICD-10-CM | POA: Insufficient documentation

## 2016-08-27 DIAGNOSIS — I5022 Chronic systolic (congestive) heart failure: Secondary | ICD-10-CM | POA: Diagnosis not present

## 2016-08-27 DIAGNOSIS — I4891 Unspecified atrial fibrillation: Secondary | ICD-10-CM

## 2016-08-27 DIAGNOSIS — I130 Hypertensive heart and chronic kidney disease with heart failure and stage 1 through stage 4 chronic kidney disease, or unspecified chronic kidney disease: Secondary | ICD-10-CM | POA: Diagnosis not present

## 2016-08-27 DIAGNOSIS — R5383 Other fatigue: Secondary | ICD-10-CM | POA: Insufficient documentation

## 2016-08-27 DIAGNOSIS — E669 Obesity, unspecified: Secondary | ICD-10-CM | POA: Diagnosis not present

## 2016-08-27 DIAGNOSIS — G4719 Other hypersomnia: Secondary | ICD-10-CM | POA: Insufficient documentation

## 2016-08-27 DIAGNOSIS — N189 Chronic kidney disease, unspecified: Secondary | ICD-10-CM

## 2016-08-27 DIAGNOSIS — Z79899 Other long term (current) drug therapy: Secondary | ICD-10-CM | POA: Insufficient documentation

## 2016-08-27 DIAGNOSIS — N183 Chronic kidney disease, stage 3 (moderate): Secondary | ICD-10-CM | POA: Diagnosis not present

## 2016-08-27 DIAGNOSIS — I240 Acute coronary thrombosis not resulting in myocardial infarction: Secondary | ICD-10-CM | POA: Diagnosis not present

## 2016-08-27 DIAGNOSIS — F32A Depression, unspecified: Secondary | ICD-10-CM

## 2016-08-27 DIAGNOSIS — Z9114 Patient's other noncompliance with medication regimen: Secondary | ICD-10-CM

## 2016-08-27 DIAGNOSIS — Z7901 Long term (current) use of anticoagulants: Secondary | ICD-10-CM | POA: Insufficient documentation

## 2016-08-27 DIAGNOSIS — E1165 Type 2 diabetes mellitus with hyperglycemia: Secondary | ICD-10-CM

## 2016-08-27 DIAGNOSIS — Z91148 Patient's other noncompliance with medication regimen for other reason: Secondary | ICD-10-CM

## 2016-08-27 LAB — BASIC METABOLIC PANEL
ANION GAP: 9 (ref 5–15)
BUN: 28 mg/dL — ABNORMAL HIGH (ref 6–20)
CHLORIDE: 99 mmol/L — AB (ref 101–111)
CO2: 31 mmol/L (ref 22–32)
Calcium: 10.5 mg/dL — ABNORMAL HIGH (ref 8.9–10.3)
Creatinine, Ser: 1.43 mg/dL — ABNORMAL HIGH (ref 0.61–1.24)
GFR, EST AFRICAN AMERICAN: 58 mL/min — AB (ref >60)
GFR, EST NON AFRICAN AMERICAN: 50 mL/min — AB (ref >60)
Glucose, Bld: 139 mg/dL — ABNORMAL HIGH (ref 65–99)
POTASSIUM: 4.2 mmol/L (ref 3.5–5.1)
SODIUM: 139 mmol/L (ref 135–145)

## 2016-08-27 LAB — BRAIN NATRIURETIC PEPTIDE: B NATRIURETIC PEPTIDE 5: 90.3 pg/mL (ref 0.0–100.0)

## 2016-08-27 MED FILL — ATORVASTATIN 40 MG TABLET: 40 | 30 days supply | Qty: 30 | Fill #1

## 2016-08-27 MED FILL — WARFARIN SODIUM 6 MG TABLET: 6 | 30 days supply | Qty: 30 | Fill #1

## 2016-08-27 MED FILL — FUROSEMIDE 80 MG TABLET: 80 | 30 days supply | Qty: 60 | Fill #1

## 2016-08-27 MED FILL — DIGITEK 125 MCG TABLET: 125 | 30 days supply | Qty: 30 | Fill #1

## 2016-08-27 MED FILL — METOPROLOL SUCC ER 100 MG T: 100 | 30 days supply | Qty: 60 | Fill #1

## 2016-08-27 NOTE — Progress Notes (Signed)
Patient ID: [name], male   DOB: [birth year], 65 y.o.   MRN: [id number]    Advanced Heart Failure Clinic Note   PCP: [name] Primary Cardiologist: Dr. Claiborne [name]  HPI: [name] is a 65 yo male with a history of afib, depression, DM2, CKD stage III, obesity, OSA and chronic systolic HF.  He was admitted 6/30-05/16/14 with dyspena and LE edema. Found to be in afib with RVR. Diuresed with IV lasix and d/c weight 234 lbs.   Admitted 2/18 -01/10/2016 wit volume overload/Afib RVR  in the setting of medication compliance. Diuresed with IV lasix and transitioned to lasix 80 mg twice a day. Had TEE but  DC-CV was cancelled due to LA thrombus. Discharge weight 189 pounds.   Admitted 5/6/-03/23/2016 with  May 6th through May 15th A fib RVR and volume overload in the setting of medication noncompliance. Restarted on HF meds. D/C with volume overload. Psychiatry met with him and he was deemed to have the ability to make decisions. Diuresed with IV lasix and transitioned to lasix 80 mg twice a day. Discharge weight was 216 pounds.   He returns today for regular follow.  Missed several visits since last. Added metolazone and requested he keep a 2 week follow up, has been > 1 month. He has no idea what medicines he is on.  States he took one white tablet yesterday and today, and one yellow tablet today. He states he has been out of lasix for a while, and took metolazone yesterday and today.  Breathing is "fine". Riding his bike around the city for multiple miles a day. No SOB walking up hill, gets pain in his legs much sooner than any SOB. HTN markedly elevated. Pt denies headache, vision changes, or numbness or tingling.  Urine yellow to clear.   Myoview 12/31/15  Perfusion: Next defects noted on rest and stress images within the inferior wall and to lesser extent the septum. No reversible defects. Wall Motion: Severe generalized hypokinesia. Left ventricle is dilute Left Ventricular  Ejection Fraction: 20 % End diastolic volume 101 ml End systolic volume 751 ml IMPRESSION: 1. 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 No evidence of ischemia. 2. Severe generalized hypokinesia with dilated left ventricle. 3. Left ventricular ejection fraction 20% 4. High-risk stress test findings*.  ROS: All systems negative except as listed in HPI, PMH and Problem List.  SH:  Social History   Social History  . Marital status: Single    Spouse name: N/A  . Number of children: N/A  . Years of education: N/A   Occupational History  . Not on file.   Social History Main Topics  . Smoking status: Not on file  . Smokeless tobacco: Not on file  . Alcohol use 0.0 oz/week  . Drug use: No  . Sexual activity: Not on file   Other Topics Concern  . Not on file   Social History Narrative   ** Merged History Encounter **       Lives in [name] himself. Retired from [name] [employer] [name] for [name].     FH:  Family History  Problem Relation Age of Onset  . Heart attack Mother     deceased  . Diabetes Mother     Past Medical History:  Diagnosis Date  . A-fib ([name])    a. (06/04/14) TEE-DC-CV; succesful; large LA 6.2 cm  . A-fib ([name])   . Ascites   .  CHF (congestive heart failure) ([name])   . Chronic systolic heart failure ([name])    a. ECHO (05/2014): EF 25-30%, diff HK, RV midly dilated and sys fx mildly/mod reduced  . CKD (chronic kidney disease)   . CKD (chronic kidney disease) stage 3, GFR 30-59 ml/min   . Depression   . Diabetes mellitus without complication ([name])   . HTN (hypertension)   . Hypertension   . OSA (obstructive sleep apnea)   . Type 2 diabetes mellitus ([name])     Current Outpatient Prescriptions  Medication Sig Dispense Refill  . Ascorbic Acid (VITAMIN C) 100 MG tablet Take 100 mg by mouth daily.    Marland Kitchen atorvastatin (LIPITOR) 40 MG tablet Take 1 tablet (40 mg total) by mouth daily at 6 PM. 30 tablet 5  .  cyanocobalamin 500 MCG tablet Take 500 mcg by mouth daily.    . digoxin (LANOXIN) 0.125 MG tablet Take 1 tablet (0.125 mg total) by mouth daily. 30 tablet 5  . furosemide (LASIX) 80 MG tablet Take 1 tablet (80 mg total) by mouth 2 (two) times daily. 60 tablet 5  . metolazone (ZAROXOLYN) 5 MG tablet Take 1 tablet by mouth daily for 3 days. 3 tablet 0  . metoprolol succinate (TOPROL-XL) 100 MG 24 hr tablet Take 1 tablet (100 mg total) by mouth 2 (two) times daily. Take with or immediately following a meal. 60 tablet 5  . Potassium Chloride ER 20 MEQ TBCR Take 2 tablets by mouth daily with Metolazone. 10 tablet 0  . sacubitril-valsartan (ENTRESTO) 24-26 MG Take 1 tablet by mouth 2 (two) times daily. 60 tablet 6  . warfarin (COUMADIN) 6 MG tablet Take as directed by Coumadin Clinic 30 tablet 1   No current facility-administered medications for this encounter.     Vitals:   08/27/16 1543  BP: (!) 204/106  BP Location: Left Arm  Patient Position: Sitting  Cuff Size: Normal  Pulse: (!) 108  SpO2: 96%  Weight: 219 lb 6.4 oz (99.5 kg)   BP improved to 170/76 at rest.   Wt Readings from Last 3 Encounters:  08/27/16 219 lb 6.4 oz (99.5 kg)  07/22/16 223 lb 12 oz (101.5 kg)  06/04/16 209 lb 12 oz (95.1 kg)   PHYSICAL EXAM: General:  Well appearing. No resp difficulty HEENT: normal. R neck wound well healed.   Neck: supple. JVP 9-10 cm. Carotids 2+ bilaterally; no bruits. No thyromegaly or lymphadenopathy noted.  Cor: PMI normal. Irregularly irregular. No M/G/R Lungs: Mildly diminished basilar sounds, otherwise clear.  Abdomen: soft, obese, NT, ND, no HSM. No bruits or masses. +BS  Extremities: no cyanosis, clubbing, rash, bilateral maculopapular rash, R and LLE 1+ edema to knees.    Neuro: alert & orientedx3, cranial nerves grossly intact. Moves all 4 extremities w/o difficulty. Affect pleasant.  ASSESSMENT & PLAN:  1) Chronic systolic HF: 03/16/3219 EF 25-42% Diffuse HK, LV thrombus.  -  Myoview 12/2015. EF 20% with defects noted on rest and stress images within the inferior wall and to lesser extent the septum suggest ischemic etiology. No reversibility. -Continues to refuse cath - NYHA II-III symptoms per patient despite marked non-compliance.  - Resume lasix 80 mg twice a day.  - Don't take any more metolazone.  Needs to have labs checked first and just resume lasix.  - Resume Entresto 24/26 mg BID. BMET today.  - Resume Toprol XL 100 mg BID - Resume digoxin 0.125 mg daily. Has been off.  Will check Dig  level with follow up labs.  - No spiro with compliance and refuses uptitration of meds, agrees to resumption of above meds.  - Reinforced the needs of daily weights, low sodium diet, and fluid restriction to <2 L daily.  2) HTN Urgency - Markedly elevated but improved with short rest.  - Pt refuses medication administration, admission, or ED visit. He prefers to go pick up his medications and return for BP check next week.  Discussed risk of stroke and end organ damage.  - Resuming as above.  3) Afib - In 2015 had DC-CV with restored Sinus Rhythm. However he was admitted February 2017 and TEE but not cardioversion due to LV thrombus. Continue coumadin and Toprol XL.  - Rate elevated today off medicines but also improved with rest.  - Has been following up with coumadin clinic per patient and records. Last INR therapeutic.  4) ?OSA - Ongoing daytime sleepiness and fatigue.  - Has continuously refused Sleep Study.    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  - INR per coumadin clinic.  6) CKD stage III - BMET today.   7) Depression/Psych  - Has hx of depression. Likely co-conditions as well. Has flight of ideas and perseverates on topics completely unrelated to the conversation at hand.  Needs PCP follow up. Has refused.  8) Noncompliance - Mr Homes has marked non-compliance and continually refuses to follow instructions.  He has incredibly poor insight  into his disease, made worse by psychiatric issues that make conversations of any sort difficult.    - He remains at high risk for re-admission due to poor insight and ongoing noncompliance.   Despite alarming vital signs today and having made it clear he should go to the emergency room or even consider admission for observation of resuming his meds, he refused and wished to resume his meds and be re-checked next week. (He even asked if it could be pushed back further, so he would have more time to acclimate to being back on his medications.)  Instructed pt to seek care if he began to experience severe headache, vision changes, paraesthesias or worsening SOB. He is quite adamant that overall he feels fine.   Had lengthy and blunt discussion with patient about compliance.  Pt continues to refuse work up. Refuses cath, refuses admission, refuses ED visit. If continues along this trajectory, he will likely have to be discharged from this clinic with his marked non-compliance.   Pt states he ran out of medication, but they have been waiting, PAID FOR by the HF fund, since 07/16/2016. It is unclear how long he has been off them at this point.   Stressed importance of close follow up and medication compliance. Needs to bring medications to his next visit.   Satira Mccallum Tillery PA-C 08/28/16   Total time spent > 25 minutes. Over half that spent discussing the above.

## 2016-08-27 NOTE — Patient Instructions (Signed)
Labs today We will only contact you if something comes back abnormal or we need to make some changes. Otherwise no news is good news!  Your physician recommends that you schedule a follow-up appointment in: one week for a b/p check and 2 weeks with [name]

## 2016-08-27 NOTE — Progress Notes (Signed)
Advanced Heart Failure Medication Review by a Pharmacist  Does the patient  feel that his/her medications are working for him/her?  yes  Has the patient been experiencing any side effects to the medications prescribed?  no  Does the patient measure his/her own blood pressure or blood glucose at home?  no   Does the patient have any problems obtaining medications due to transportation or finances?   yes  Understanding of regimen: poor Understanding of indications: poor Potential of compliance: poor Patient understands to avoid NSAIDs. Patient understands to avoid decongestants.  Issues to address at subsequent visits: Compliance/understanding of regimen   Pharmacist comments:  Mr. Wiggs is a 65 yo M presenting without a medication list and with poor insight into which medications he is taking (he can only tell me the colors of the tablets he is taking). He states that he has started receiving Entresto from [facility] since last week but is out of some of his other medications. He does not know which ones. I will verify with our Outpatient Pharmacy which medications he has been filling.   Warfarin 30 day supply on 07/24/16 Atorvas, furosemide, metop, dig 30 day supplies on 07/16/16  Klor con/metolazone on 9/13   Dayle Sherpa K. Bonnye Fava, PharmD, BCPS, CPP Clinical Pharmacist Pager: [phone] Phone: [phone] 08/27/2016 4:00 PM       Time with patient: 15 minutes Preparation and documentation time: 10 minutes Total time: 25 minutes

## 2016-09-03 ENCOUNTER — Encounter (HOSPITAL_COMMUNITY): Payer: Self-pay

## 2016-09-10 ENCOUNTER — Encounter (HOSPITAL_COMMUNITY): Payer: Self-pay

## 2016-09-16 ENCOUNTER — Ambulatory Visit (HOSPITAL_COMMUNITY)
Admission: RE | Admit: 2016-09-16 | Discharge: 2016-09-16 | Disposition: A | Discharge status: Home / Self Care | Payer: Medicare Other | Source: Ambulatory Visit | Attending: Internal Medicine | Admitting: Internal Medicine

## 2016-09-16 VITALS — BP 180/106 | HR 74 | Wt 226.4 lb

## 2016-09-16 DIAGNOSIS — N183 Chronic kidney disease, stage 3 (moderate): Secondary | ICD-10-CM | POA: Diagnosis not present

## 2016-09-16 DIAGNOSIS — I8290 Acute embolism and thrombosis of unspecified vein: Secondary | ICD-10-CM | POA: Diagnosis not present

## 2016-09-16 DIAGNOSIS — G4733 Obstructive sleep apnea (adult) (pediatric): Secondary | ICD-10-CM | POA: Diagnosis not present

## 2016-09-16 DIAGNOSIS — I110 Hypertensive heart disease with heart failure: Secondary | ICD-10-CM

## 2016-09-16 DIAGNOSIS — Z7901 Long term (current) use of anticoagulants: Secondary | ICD-10-CM | POA: Insufficient documentation

## 2016-09-16 DIAGNOSIS — Z79899 Other long term (current) drug therapy: Secondary | ICD-10-CM | POA: Diagnosis not present

## 2016-09-16 DIAGNOSIS — E669 Obesity, unspecified: Secondary | ICD-10-CM | POA: Insufficient documentation

## 2016-09-16 DIAGNOSIS — I130 Hypertensive heart and chronic kidney disease with heart failure and stage 1 through stage 4 chronic kidney disease, or unspecified chronic kidney disease: Secondary | ICD-10-CM | POA: Diagnosis not present

## 2016-09-16 DIAGNOSIS — I4891 Unspecified atrial fibrillation: Secondary | ICD-10-CM | POA: Diagnosis not present

## 2016-09-16 DIAGNOSIS — F329 Major depressive disorder, single episode, unspecified: Secondary | ICD-10-CM | POA: Insufficient documentation

## 2016-09-16 DIAGNOSIS — I5022 Chronic systolic (congestive) heart failure: Secondary | ICD-10-CM | POA: Diagnosis not present

## 2016-09-16 DIAGNOSIS — E1122 Type 2 diabetes mellitus with diabetic chronic kidney disease: Secondary | ICD-10-CM | POA: Insufficient documentation

## 2016-09-16 DIAGNOSIS — Z9114 Patient's other noncompliance with medication regimen: Secondary | ICD-10-CM | POA: Diagnosis not present

## 2016-09-16 DIAGNOSIS — Z9119 Patient's noncompliance with other medical treatment and regimen: Secondary | ICD-10-CM | POA: Diagnosis not present

## 2016-09-16 MED ORDER — METOLAZONE 5 MG PO TABS: 5.0000 mg | ORAL_TABLET | ORAL | 0 refills | Status: DC

## 2016-09-16 MED ORDER — SACUBITRIL-VALSARTAN 49-51 MG PO TABS: 1.0000 | ORAL_TABLET | Freq: Two times a day (BID) | ORAL | 6 refills | Status: DC

## 2016-09-16 MED FILL — metOLazone 5 MG TABS: 5 | 28 days supply | Qty: 4 | Fill #0

## 2016-09-16 NOTE — Patient Instructions (Signed)
INCREASE Entresto to 49/51 mg ,one tab twice a day START Metolazone 5 mg, one tab weekly one [facility]  Your physician recommends that you schedule a follow-up appointment in: 4 weeks with Dr Gala Romney and echo  Your physician has requested that you have an echocardiogram. Echocardiography is a painless test that uses sound waves to create images of your heart. It provides your doctor with information about the size and shape of your heart and how well your heart's chambers and valves are working. This procedure takes approximately one hour. There are no restrictions for this procedure.  Do the following things EVERYDAY: 1) Weigh yourself in the morning before breakfast. Write it down and keep it in a log. 2) Take your medicines as prescribed 3) Eat low salt foods-Limit salt (sodium) to 2000 mg per day.  4) Stay as active as you can everyday 5) Limit all fluids for the day to less than 2 liters

## 2016-09-16 NOTE — Progress Notes (Signed)
Patient ID: [name], male   DOB: 1951-06-25, 65 y.o.   MRN: [id number]    Advanced Heart Failure Clinic Note   PCP: [facility] Primary Cardiologist: Dr. Claiborne Billings  HPI: [name] is a 65 yo male with a history of afib, depression, DM2, CKD stage III, obesity, OSA and chronic systolic HF.  He was admitted 6/30-05/16/14 with dyspena and LE edema. Found to be in afib with RVR. Diuresed with IV lasix and d/c weight 234 lbs.   Admitted 2/18 -01/10/2016 wit volume overload/Afib RVR  in the setting of medication compliance. Diuresed with IV lasix and transitioned to lasix 80 mg twice a day. Had TEE but  DC-CV was cancelled due to LA thrombus. Discharge weight 189 pounds.   Admitted 5/6/-03/23/2016 with  May 6th through May 15th A fib RVR and volume overload in the setting of medication noncompliance. Restarted on HF meds. D/C with volume overload. Psychiatry met with him and he was deemed to have the ability to make decisions. Diuresed with IV lasix and transitioned to lasix 80 mg twice a day. Discharge weight was 216 pounds.   He returns today for HF follow up. Overall feeling. Denies SOB/PND/Orthopnea. Riding his bike 6-10 miles a day. Says he has all medications except digoxin . He did not bring medications. Not weighing at home on a regular basis. Last weight 217 pounds.    Myoview 12/31/15  Perfusion: Next defects noted on rest and stress images within the inferior wall and to lesser extent the septum. No reversible defects. Wall Motion: Severe generalized hypokinesia. Left ventricle is dilute Left Ventricular Ejection Fraction: 20 % End diastolic volume 433 ml End systolic volume 295 ml IMPRESSION: 1. 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 No evidence of ischemia. 2. Severe generalized hypokinesia with dilated left ventricle. 3. Left ventricular ejection fraction 20% 4. High-risk stress test findings*.  ROS: All systems negative  except as listed in HPI, PMH and Problem List.  SH:  Social History   Social History  . Marital status: Single    Spouse name: N/A  . Number of children: N/A  . Years of education: N/A   Occupational History  . Not on file.   Social History Main Topics  . Smoking status: Not on file  . Smokeless tobacco: Not on file  . Alcohol use 0.0 oz/week  . Drug use: No  . Sexual activity: Not on file   Other Topics Concern  . Not on file   Social History Narrative   ** Merged History Encounter **       Lives in [place] by himself. Retired from [employer] and [employer] for [employer].     FH:  Family History  Problem Relation Age of Onset  . Heart attack Mother     deceased  . Diabetes Mother     Past Medical History:  Diagnosis Date  . A-fib ([place])    a. (06/04/14) TEE-DC-CV; succesful; large LA 6.2 cm  . A-fib ([place])   . Ascites   . CHF (congestive heart failure) ([place])   . Chronic systolic heart failure ([place])    a. ECHO (05/2014): EF 25-30%, diff HK, RV midly dilated and sys fx mildly/mod reduced  . CKD (chronic kidney disease)   . CKD (chronic kidney disease) stage 3, GFR 30-59 ml/min   . Depression   . Diabetes mellitus without complication ([place])   . HTN (hypertension)   . Hypertension   .  OSA (obstructive sleep apnea)   . Type 2 diabetes mellitus ([place])     Current Outpatient Prescriptions  Medication Sig Dispense Refill  . Ascorbic Acid (VITAMIN C) 100 MG tablet Take 100 mg by mouth daily.    Marland Kitchen atorvastatin (LIPITOR) 40 MG tablet Take 1 tablet (40 mg total) by mouth daily at 6 PM. 30 tablet 5  . cyanocobalamin 500 MCG tablet Take 500 mcg by mouth daily.    . digoxin (LANOXIN) 0.125 MG tablet Take 1 tablet (0.125 mg total) by mouth daily. 30 tablet 5  . furosemide (LASIX) 80 MG tablet Take 1 tablet (80 mg total) by mouth 2 (two) times daily. 60 tablet 5  . metolazone (ZAROXOLYN) 5 MG tablet Take 1 tablet by mouth daily for 3 days. 3 tablet 0  . metoprolol  succinate (TOPROL-XL) 100 MG 24 hr tablet Take 1 tablet (100 mg total) by mouth 2 (two) times daily. Take with or immediately following a meal. 60 tablet 5  . sacubitril-valsartan (ENTRESTO) 24-26 MG Take 1 tablet by mouth 2 (two) times daily. 60 tablet 6  . warfarin (COUMADIN) 6 MG tablet Take as directed by Coumadin Clinic 30 tablet 1   No current facility-administered medications for this encounter.     Vitals:   09/16/16 1427  BP: (!) 180/106  BP Location: Left Arm  Patient Position: Sitting  Cuff Size: Normal  Pulse: 74  SpO2: 91%  Weight: 226 lb 6.4 oz (102.7 kg)   BP improved to 170/76 at rest.   Wt Readings from Last 3 Encounters:  09/16/16 226 lb 6.4 oz (102.7 kg)  08/27/16 219 lb 6.4 oz (99.5 kg)  07/22/16 223 lb 12 oz (101.5 kg)   PHYSICAL EXAM: General:  Well appearing. No resp difficulty HEENT: normal. R neck wound well healed.   Neck: supple. JVP 9-10 cm. Carotids 2+ bilaterally; no bruits. No thyromegaly or lymphadenopathy noted.  Cor: PMI normal. Irregularly irregular. No M/G/R Lungs: Mildly diminished basilar sounds, otherwise clear.  Abdomen: soft, obese, NT, ND, no HSM. No bruits or masses. +BS  Extremities: no cyanosis, clubbing, rash, bilateral maculopapular rash, R and LLE 1+ edema to knees.    Neuro: alert & orientedx3, cranial nerves grossly intact. Moves all 4 extremities w/o difficulty. Affect pleasant.  ASSESSMENT & PLAN:  1) Chronic systolic HF: 0/02/8888 EF 16-94% Diffuse HK, LV thrombus.  - Myoview 12/2015. EF 20% with defects noted on rest and stress images within the inferior wall and to lesser extent the septum suggest ischemic etiology. No reversibility. -Continues to refuse cath. Discussed again today.  - NYHA II-III symptoms per patient despite marked non-compliance.  - Volume status elevated. Continue  80 mg twice a day and add 2.5 mg metolazone every Wednesday.   -Increase entresto 49-51 mg twice a day. Hypertensive today.  - Continue  Toprol XL 100 mg BID and digoxin 0.125 mg daily.  - No spiro due to noncompliance.   - Reinforced medication compliance. Marland Kitchen  2) HTN Urgency - Elevated. Discussed risk of stroke and end organ damage. Increase entresto as above.   3) Afib - In 2015 had DC-CV with restored Sinus Rhythm. However he was admitted February 2017 and TEE but not cardioversion due to LV thrombus.  Continue coumadin and Toprol XL.  INR followed at the coumadin clinic.   4) ?OSA - Ongoing daytime sleepiness and fatigue. Refused Sleep Study.    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  - INR per  coumadin clinic.  6) CKD stage III Reviewed BMET from 08/27/2016.    7) Depression/Psych  - Has hx of depression. Likely co-conditions as well.  8) Noncompliance  Hard to know if he taking medications. Refuses lab work. Refuses LHC. He is very difficult manage due to noncompliance and poor insight.   Follow up 4 weeks with an ECHO.    Hiroki Wint NP-C 2:33 PM

## 2016-09-22 MED FILL — DIGITEK 125 MCG TABLET: 125 | 30 days supply | Qty: 30 | Fill #2

## 2016-09-22 MED FILL — FUROSEMIDE 80 MG TABLET: 80 | 30 days supply | Qty: 60 | Fill #2

## 2016-09-22 MED FILL — METOPROLOL SUCC ER 100 MG T: 100 | 30 days supply | Qty: 60 | Fill #2

## 2016-09-22 MED FILL — ATORVASTATIN 40 MG TABLET: 40 | 30 days supply | Qty: 30 | Fill #2

## 2016-09-25 ENCOUNTER — Other Ambulatory Visit: Payer: Self-pay | Admitting: Internal Medicine

## 2016-09-28 ENCOUNTER — Telehealth (HOSPITAL_COMMUNITY): Payer: Self-pay | Admitting: *Deleted

## 2016-09-28 ENCOUNTER — Other Ambulatory Visit: Payer: Self-pay | Admitting: Internal Medicine

## 2016-09-28 NOTE — Telephone Encounter (Signed)
Patient called in asking for Coumadin to be sent to pharmacy.    Called and spoke with patient and instructed him to call Coumadin clinic to have his levels checked.  Explained they would need to refill his coumadin.  Patient understands with no further questions.

## 2016-09-29 ENCOUNTER — Ambulatory Visit (INDEPENDENT_AMBULATORY_CARE_PROVIDER_SITE_OTHER): Payer: Medicare Other

## 2016-09-29 DIAGNOSIS — I4891 Unspecified atrial fibrillation: Secondary | ICD-10-CM | POA: Diagnosis not present

## 2016-09-29 DIAGNOSIS — I4892 Unspecified atrial flutter: Secondary | ICD-10-CM

## 2016-09-29 DIAGNOSIS — Z5181 Encounter for therapeutic drug level monitoring: Secondary | ICD-10-CM | POA: Diagnosis not present

## 2016-09-29 DIAGNOSIS — I8290 Acute embolism and thrombosis of unspecified vein: Secondary | ICD-10-CM

## 2016-09-29 LAB — POCT INR: INR: 1.7

## 2016-09-29 MED ORDER — WARFARIN SODIUM 6 MG PO TABS: ORAL_TABLET | ORAL | 1 refills | Status: DC

## 2016-09-29 MED FILL — WARFARIN SODIUM 6 MG TABLET: 6 | 30 days supply | Qty: 35 | Fill #0

## 2016-10-06 ENCOUNTER — Ambulatory Visit (HOSPITAL_COMMUNITY): Admission: RE | Admit: 2016-10-06 | Payer: Medicare Other | Source: Ambulatory Visit

## 2016-10-14 ENCOUNTER — Ambulatory Visit (HOSPITAL_COMMUNITY): Payer: Medicare Other | Attending: Adult Health

## 2016-10-15 ENCOUNTER — Ambulatory Visit (HOSPITAL_COMMUNITY)
Admission: RE | Admit: 2016-10-15 | Discharge: 2016-10-15 | Disposition: A | Discharge status: Home / Self Care | Payer: Medicare Other | Source: Ambulatory Visit | Attending: Internal Medicine | Admitting: Internal Medicine

## 2016-10-15 VITALS — BP 162/64 | HR 60 | Resp 18 | Wt 231.5 lb

## 2016-10-15 DIAGNOSIS — Z9119 Patient's noncompliance with other medical treatment and regimen: Secondary | ICD-10-CM | POA: Diagnosis not present

## 2016-10-15 DIAGNOSIS — I5022 Chronic systolic (congestive) heart failure: Secondary | ICD-10-CM | POA: Diagnosis not present

## 2016-10-15 DIAGNOSIS — E669 Obesity, unspecified: Secondary | ICD-10-CM | POA: Insufficient documentation

## 2016-10-15 DIAGNOSIS — Z7901 Long term (current) use of anticoagulants: Secondary | ICD-10-CM | POA: Diagnosis not present

## 2016-10-15 DIAGNOSIS — Z9114 Patient's other noncompliance with medication regimen: Secondary | ICD-10-CM

## 2016-10-15 DIAGNOSIS — E1122 Type 2 diabetes mellitus with diabetic chronic kidney disease: Secondary | ICD-10-CM | POA: Insufficient documentation

## 2016-10-15 DIAGNOSIS — I130 Hypertensive heart and chronic kidney disease with heart failure and stage 1 through stage 4 chronic kidney disease, or unspecified chronic kidney disease: Secondary | ICD-10-CM | POA: Insufficient documentation

## 2016-10-15 DIAGNOSIS — Z79899 Other long term (current) drug therapy: Secondary | ICD-10-CM | POA: Diagnosis not present

## 2016-10-15 DIAGNOSIS — G4733 Obstructive sleep apnea (adult) (pediatric): Secondary | ICD-10-CM | POA: Insufficient documentation

## 2016-10-15 DIAGNOSIS — I4891 Unspecified atrial fibrillation: Secondary | ICD-10-CM | POA: Insufficient documentation

## 2016-10-15 DIAGNOSIS — F329 Major depressive disorder, single episode, unspecified: Secondary | ICD-10-CM | POA: Insufficient documentation

## 2016-10-15 DIAGNOSIS — N183 Chronic kidney disease, stage 3 (moderate): Secondary | ICD-10-CM | POA: Diagnosis not present

## 2016-10-15 MED ORDER — ATORVASTATIN CALCIUM 40 MG PO TABS: 40.0000 mg | ORAL_TABLET | Freq: Every day | ORAL | 3 refills | Status: DC

## 2016-10-15 MED ORDER — SACUBITRIL-VALSARTAN 49-51 MG PO TABS: 1.0000 | ORAL_TABLET | Freq: Two times a day (BID) | ORAL | 3 refills | Status: DC

## 2016-10-15 MED ORDER — METOLAZONE 5 MG PO TABS: 5.0000 mg | ORAL_TABLET | ORAL | 3 refills | Status: DC

## 2016-10-15 MED FILL — metOLazone 5 MG TABS: 5 | 28 days supply | Qty: 4 | Fill #0

## 2016-10-15 MED FILL — ATORVASTATIN 40 MG TABLET: 40 | 30 days supply | Qty: 30 | Fill #0

## 2016-10-15 NOTE — Progress Notes (Signed)
Patient ID: [name], male   DOB: Feb 09, 1951, 65 y.o.   MRN: [id number]    Advanced Heart Failure Clinic Note   PCP: [facility] Primary Cardiologist: Dr. Claiborne Billings  HPI: [name] is a 65 yo male with a history of afib, depression, DM2, CKD stage III, obesity, OSA and chronic systolic HF.  He was admitted 6/30-05/16/14 with dyspena and LE edema. Found to be in afib with RVR. Diuresed with IV lasix and d/c weight 234 lbs.   Admitted 2/18 -01/10/2016 wit volume overload/Afib RVR  in the setting of medication compliance. Diuresed with IV lasix and transitioned to lasix 80 mg twice a day. Had TEE but  DC-CV was cancelled due to LA thrombus. Discharge weight 189 pounds.   Admitted 5/6/-03/23/2016 with  May 6th through May 15th A fib RVR and volume overload in the setting of medication noncompliance. Restarted on HF meds. D/C with volume overload. Psychiatry met with him and he was deemed to have the ability to make decisions. Diuresed with IV lasix and transitioned to lasix 80 mg twice a day. Discharge weight was 216 pounds.   Echo 5/17 EF 15-20%  He returns today for HF follow up. Overall feeling. Denies SOB/PND/Orthopnea. Riding his bike at least 5 miles a day. Taking Entresto 24/26 (prescribed at 49/51). Not taking lipitor. Says he is taking all other meds including lasix 80 bid. Denies CP, orthopnea, PND.    Myoview 12/31/15  Perfusion: Next defects noted on rest and stress images within the inferior wall and to lesser extent the septum. No reversible defects. Wall Motion: Severe generalized hypokinesia. Left ventricle is dilute Left Ventricular Ejection Fraction: 20 % End diastolic volume 166 ml End systolic volume 063 ml IMPRESSION: 1. 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 No evidence of ischemia. 2. Severe generalized hypokinesia with dilated left ventricle. 3. Left ventricular ejection fraction 20% 4. High-risk stress test  findings*.  ROS: All systems negative except as listed in HPI, PMH and Problem List.  SH:  Social History   Social History  . Marital status: Single    Spouse name: N/A  . Number of children: N/A  . Years of education: N/A   Occupational History  . Not on file.   Social History Main Topics  . Smoking status: Not on file  . Smokeless tobacco: Not on file  . Alcohol use 0.0 oz/week  . Drug use: No  . Sexual activity: Not on file   Other Topics Concern  . Not on file   Social History Narrative   ** Merged History Encounter **       Lives in [place] by himself. Retired from [employer] and [employer] for [employer].     FH:  Family History  Problem Relation Age of Onset  . Heart attack Mother     deceased  . Diabetes Mother     Past Medical History:  Diagnosis Date  . A-fib ([place])    a. (06/04/14) TEE-DC-CV; succesful; large LA 6.2 cm  . A-fib ([place])   . Ascites   . CHF (congestive heart failure) ([place])   . Chronic systolic heart failure ([place])    a. ECHO (05/2014): EF 25-30%, diff HK, RV midly dilated and sys fx mildly/mod reduced  . CKD (chronic kidney disease)   . CKD (chronic kidney disease) stage 3, GFR 30-59 ml/min   . Depression   . Diabetes mellitus without complication ([place])   . HTN (  hypertension)   . Hypertension   . OSA (obstructive sleep apnea)   . Type 2 diabetes mellitus ([place])     Current Outpatient Prescriptions  Medication Sig Dispense Refill  . digoxin (LANOXIN) 0.125 MG tablet Take 1 tablet (0.125 mg total) by mouth daily. 30 tablet 5  . furosemide (LASIX) 80 MG tablet Take 1 tablet (80 mg total) by mouth 2 (two) times daily. 60 tablet 5  . metoprolol succinate (TOPROL-XL) 100 MG 24 hr tablet Take 1 tablet (100 mg total) by mouth 2 (two) times daily. Take with or immediately following a meal. 60 tablet 5  . sacubitril-valsartan (ENTRESTO) 24-26 MG Take 1 tablet by mouth 2 (two) times daily.    Marland Kitchen warfarin (COUMADIN) 6 MG tablet Take as  directed by Coumadin Clinic 35 tablet 1   No current facility-administered medications for this encounter.     Vitals:   10/15/16 1529  BP: (!) 162/64  BP Location: Right Arm  Patient Position: Sitting  Cuff Size: Large  Pulse: 60  Resp: 18  SpO2: 96%  Weight: 231 lb 8 oz (105 kg)   BP improved to 170/76 at rest.   Wt Readings from Last 3 Encounters:  10/15/16 231 lb 8 oz (105 kg)  09/16/16 226 lb 6.4 oz (102.7 kg)  08/27/16 219 lb 6.4 oz (99.5 kg)   PHYSICAL EXAM: General:  Well appearing. No resp difficulty HEENT: normal.  Neck: supple. JVP 7 cm. Carotids 2+ bilaterally; no bruits. No thyromegaly or lymphadenopathy noted.  Cor: PMI normal. Irregularly irregular. No M/G/R Lungs: Mildly diminished basilar sounds, otherwise clear.  Abdomen: soft, obese, NT, ND, no HSM. No bruits or masses. +BS  Extremities: no cyanosis, clubbing, rash, bilateral maculopapular rash, R and LLE trace edema  Neuro: alert & orientedx3, cranial nerves grossly intact. Moves all 4 extremities w/o difficulty. Affect pleasant.  ASSESSMENT & PLAN:  1) Chronic systolic HF: 2/0/1007 EF 12-19% Diffuse HK, LV thrombus.  - Myoview 12/2015. EF 20% with defects noted on rest and stress images within the inferior wall and to lesser extent the septum suggest ischemic etiology. No reversibility. -Continues to refuse cath. Discussed again today.  - NYHA I-II  symptoms per patient despite non-compliance.  - Volume status ok despite weight gain. Continue lasix 80 mg twice a day - Increase entresto 49-51 mg twice a day. Hypertensive today.  - Continue Toprol XL 100 mg BID and digoxin 0.125 mg daily.  - No spiro due to noncompliance.   - Reinforced medication compliance. Marland Kitchen  2) HTN - Elevated. Increase entresto as above.   3) Afib - In 2015 had DC-CV with restored Sinus Rhythm. However he was admitted February 2017 and TEE but not cardioversion due to LV thrombus.  Continue coumadin and Toprol XL. Continue rate  control  INR followed at the coumadin clinic.   4) ?OSA - Ongoing daytime sleepiness and fatigue. Refused Sleep Study.    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  - INR per coumadin clinic.  6) CKD stage III Reviewed BMET from 08/27/2016.    7) Depression/Psych  - Has hx of depression. Likely co-conditions as well. See below.  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 In his visit today, he had flight of ideas and was intermittently quoting song  lyrics and other random information to me.Many of his questions did not make sense and were not related to the previous topic we were discussing. I do not think there is any point in continuing to discuss the need for cath or ICD with him as he just can't comprehend these issues. We will do our best to help him be compliant with his medications and keep his fluid off.   Glori Bickers MD 3:53 PM

## 2016-10-15 NOTE — Patient Instructions (Addendum)
Increase Entresto to 49mg /51mg  (1 Tab) Two Times Daily  Restart your Lipitor 40mg  (1 Tab) Once Daily in the Evenings  Restart your Metolazone 5mg  (1 Tab) Once Weekly on Fridays  Follow up in 3 months

## 2016-10-22 MED FILL — FUROSEMIDE 80 MG TABLET: 80 | 30 days supply | Qty: 60 | Fill #3

## 2016-10-22 MED FILL — DIGITEK 125 MCG TABLET: 125 | 30 days supply | Qty: 30 | Fill #3

## 2016-10-22 MED FILL — WARFARIN SODIUM 6 MG TABLET: 6 | 30 days supply | Qty: 35 | Fill #1

## 2016-10-22 MED FILL — METOPROLOL SUCC ER 100 MG T: 100 | 30 days supply | Qty: 60 | Fill #3

## 2016-11-04 ENCOUNTER — Telehealth: Payer: Self-pay | Admitting: Physician Assistant

## 2016-11-04 NOTE — Telephone Encounter (Signed)
Paged by answering service, Pt need rx for Entresto. Pt script was sent last week when he saw Dr. Gala Romney. He said he is in [employer] program. He does not gets his medication from [facility] outpatient pharmacy (listed as primary pharmacy). Advised to call in AM for medication refills.   Also reminded patient that he is due to check his INR. Last INR check done 09/29/16. He is taking his coumadin. Discussed bleeding complications. No melena or blood in his stool or urine.

## 2016-11-05 ENCOUNTER — Telehealth (HOSPITAL_COMMUNITY): Payer: Self-pay | Admitting: Cardiology

## 2016-11-05 MED ORDER — SACUBITRIL-VALSARTAN 49-51 MG PO TABS: 1.0000 | ORAL_TABLET | Freq: Two times a day (BID) | ORAL | 3 refills | Status: DC

## 2016-11-05 NOTE — Telephone Encounter (Signed)
PATIENT CALLED TO REQUEST AN UPDATED PRESCRIPTION BE SENT TO THE [employer] PATIENT [facility]ISTANT PROGRAM FOR ENTRESTO

## 2016-11-13 ENCOUNTER — Ambulatory Visit (INDEPENDENT_AMBULATORY_CARE_PROVIDER_SITE_OTHER): Payer: Medicare Other | Admitting: Pharmacist

## 2016-11-13 DIAGNOSIS — I4891 Unspecified atrial fibrillation: Secondary | ICD-10-CM

## 2016-11-13 DIAGNOSIS — Z5181 Encounter for therapeutic drug level monitoring: Secondary | ICD-10-CM

## 2016-11-13 DIAGNOSIS — I8290 Acute embolism and thrombosis of unspecified vein: Secondary | ICD-10-CM

## 2016-11-13 DIAGNOSIS — I4892 Unspecified atrial flutter: Secondary | ICD-10-CM | POA: Diagnosis not present

## 2016-11-13 LAB — POCT INR: INR: 2.9

## 2016-12-02 ENCOUNTER — Other Ambulatory Visit (HOSPITAL_COMMUNITY): Payer: Self-pay | Admitting: Pharmacist

## 2016-12-02 MED ORDER — WARFARIN SODIUM 6 MG PO TABS: ORAL_TABLET | ORAL | 1 refills | Status: DC

## 2016-12-02 MED FILL — metOLazone 5 MG TABS: 5 | 28 days supply | Qty: 4 | Fill #1

## 2016-12-02 MED FILL — METOPROLOL SUCC ER 100 MG T: 100 | 30 days supply | Qty: 60 | Fill #4

## 2016-12-02 MED FILL — ATORVASTATIN 40 MG TABLET: 40 | 30 days supply | Qty: 30 | Fill #1

## 2016-12-02 MED FILL — FUROSEMIDE 80 MG TABLET: 80 | 30 days supply | Qty: 60 | Fill #4

## 2016-12-02 MED FILL — DIGITEK 125 MCG TABLET: 125 | 30 days supply | Qty: 30 | Fill #4

## 2016-12-02 MED FILL — WARFARIN SODIUM 6 MG TABLET: 6 | 30 days supply | Qty: 35 | Fill #0

## 2016-12-04 ENCOUNTER — Encounter (INDEPENDENT_AMBULATORY_CARE_PROVIDER_SITE_OTHER): Payer: Self-pay

## 2016-12-04 ENCOUNTER — Ambulatory Visit (INDEPENDENT_AMBULATORY_CARE_PROVIDER_SITE_OTHER): Payer: Medicare Other

## 2016-12-04 DIAGNOSIS — Z5181 Encounter for therapeutic drug level monitoring: Secondary | ICD-10-CM | POA: Diagnosis not present

## 2016-12-04 DIAGNOSIS — I4891 Unspecified atrial fibrillation: Secondary | ICD-10-CM | POA: Diagnosis not present

## 2016-12-04 DIAGNOSIS — I8290 Acute embolism and thrombosis of unspecified vein: Secondary | ICD-10-CM

## 2016-12-04 DIAGNOSIS — I4892 Unspecified atrial flutter: Secondary | ICD-10-CM | POA: Diagnosis not present

## 2016-12-04 LAB — POCT INR: INR: 2.5

## 2016-12-22 ENCOUNTER — Other Ambulatory Visit (HOSPITAL_COMMUNITY): Payer: Self-pay | Admitting: *Deleted

## 2016-12-22 ENCOUNTER — Telehealth (HOSPITAL_COMMUNITY): Payer: Self-pay | Admitting: *Deleted

## 2016-12-22 MED ORDER — SACUBITRIL-VALSARTAN 49-51 MG PO TABS: 1.0000 | ORAL_TABLET | Freq: Two times a day (BID) | ORAL | 3 refills | Status: DC

## 2016-12-22 NOTE — Telephone Encounter (Signed)
[employer] called requesting for a printing prescription for patient's increased dose of Entresto 49-51mg .  Prescription signed and faxed to [phone].

## 2016-12-30 ENCOUNTER — Telehealth (HOSPITAL_COMMUNITY): Payer: Self-pay | Admitting: Pharmacist

## 2016-12-30 MED ORDER — FUROSEMIDE 80 MG PO TABS: 80.0000 mg | ORAL_TABLET | Freq: Two times a day (BID) | ORAL | 5 refills | Status: DC

## 2016-12-30 MED ORDER — METOPROLOL SUCCINATE ER 100 MG PO TB24: 100.0000 mg | ORAL_TABLET | Freq: Two times a day (BID) | ORAL | 5 refills | Status: DC

## 2016-12-30 MED ORDER — DIGOXIN 125 MCG PO TABS: 0.1250 mg | ORAL_TABLET | Freq: Every day | ORAL | 5 refills | Status: DC

## 2016-12-30 MED ORDER — METOLAZONE 5 MG PO TABS: 5.0000 mg | ORAL_TABLET | ORAL | 3 refills | Status: DC

## 2016-12-30 MED ORDER — ATORVASTATIN CALCIUM 40 MG PO TABS: 40.0000 mg | ORAL_TABLET | Freq: Every day | ORAL | 3 refills | Status: DC

## 2016-12-30 MED FILL — FUROSEMIDE 80 MG TABLET: 80 | 30 days supply | Qty: 60 | Fill #0

## 2016-12-30 MED FILL — METOPROLOL SUCC ER 100 MG T: 100 | 30 days supply | Qty: 60 | Fill #0

## 2016-12-30 MED FILL — ATORVASTATIN 40 MG TABLET: 40 | 30 days supply | Qty: 30 | Fill #0

## 2016-12-30 MED FILL — DIGITEK 125 MCG TABLET: 125 | 30 days supply | Qty: 30 | Fill #0

## 2016-12-30 MED FILL — metOLazone 5 MG TABS: 5 | 28 days supply | Qty: 4 | Fill #0

## 2016-12-30 NOTE — Telephone Encounter (Signed)
[name] called stating that [employer] patient assistance needed a new Rx for Entresto 49-51 mg BID. I called [employer] and they stated that they had the new Rx but Mr. Cutting needed to call [phone] and apply for Medicare LIS. If he is denied, [employer] will continue to provide the [place] to him at no cost.   Cicero Duck K. Bonnye Fava, PharmD, BCPS, CPP Clinical Pharmacist Pager: [phone] Phone: [phone] 12/30/2016 11:13 AM

## 2017-01-04 ENCOUNTER — Ambulatory Visit (INDEPENDENT_AMBULATORY_CARE_PROVIDER_SITE_OTHER): Payer: Medicare Other | Admitting: Pharmacist

## 2017-01-04 ENCOUNTER — Encounter (INDEPENDENT_AMBULATORY_CARE_PROVIDER_SITE_OTHER): Payer: Self-pay

## 2017-01-04 DIAGNOSIS — I8290 Acute embolism and thrombosis of unspecified vein: Secondary | ICD-10-CM | POA: Diagnosis not present

## 2017-01-04 DIAGNOSIS — Z5181 Encounter for therapeutic drug level monitoring: Secondary | ICD-10-CM | POA: Diagnosis not present

## 2017-01-04 DIAGNOSIS — I4892 Unspecified atrial flutter: Secondary | ICD-10-CM

## 2017-01-04 DIAGNOSIS — I4891 Unspecified atrial fibrillation: Secondary | ICD-10-CM

## 2017-01-04 LAB — POCT INR: INR: 2.8

## 2017-01-07 MED FILL — WARFARIN SODIUM 6 MG TABLET: 6 | 30 days supply | Qty: 35 | Fill #1

## 2017-02-01 ENCOUNTER — Ambulatory Visit (INDEPENDENT_AMBULATORY_CARE_PROVIDER_SITE_OTHER): Payer: Medicare Other | Admitting: *Deleted

## 2017-02-01 ENCOUNTER — Encounter (INDEPENDENT_AMBULATORY_CARE_PROVIDER_SITE_OTHER): Payer: Self-pay

## 2017-02-01 DIAGNOSIS — I8290 Acute embolism and thrombosis of unspecified vein: Secondary | ICD-10-CM | POA: Diagnosis not present

## 2017-02-01 DIAGNOSIS — I4891 Unspecified atrial fibrillation: Secondary | ICD-10-CM | POA: Diagnosis not present

## 2017-02-01 DIAGNOSIS — I4892 Unspecified atrial flutter: Secondary | ICD-10-CM | POA: Diagnosis not present

## 2017-02-01 DIAGNOSIS — Z5181 Encounter for therapeutic drug level monitoring: Secondary | ICD-10-CM | POA: Diagnosis not present

## 2017-02-01 LAB — POCT INR: INR: 5.8

## 2017-02-01 MED ORDER — WARFARIN SODIUM 6 MG PO TABS: ORAL_TABLET | ORAL | 1 refills | Status: DC

## 2017-02-08 ENCOUNTER — Telehealth (HOSPITAL_COMMUNITY): Payer: Self-pay | Admitting: Pharmacist

## 2017-02-08 MED FILL — METOPROLOL SUCC ER 100 MG T: 100 | 30 days supply | Qty: 60 | Fill #1

## 2017-02-08 MED FILL — FUROSEMIDE 80 MG TABLET: 80 | 30 days supply | Qty: 60 | Fill #1

## 2017-02-08 MED FILL — DIGITEK 125 MCG TABLET: 125 | 30 days supply | Qty: 30 | Fill #1

## 2017-02-08 NOTE — Telephone Encounter (Signed)
Mr. Peniche called asking for refills on all of his medications. I informed him that he had refills remaining on all of his medications and he would need to call our Outpatient Pharmacy to fill them for him. He verbalized understanding and will call the pharmacy today.   [name]. Bonnye Fava, PharmD, BCPS, CPP Clinical Pharmacist Pager: [phone] Phone: [phone] 02/08/2017 3:45 PM

## 2017-02-10 ENCOUNTER — Ambulatory Visit (INDEPENDENT_AMBULATORY_CARE_PROVIDER_SITE_OTHER): Payer: Medicare Other | Admitting: Pharmacist

## 2017-02-10 DIAGNOSIS — Z5181 Encounter for therapeutic drug level monitoring: Secondary | ICD-10-CM | POA: Diagnosis not present

## 2017-02-10 DIAGNOSIS — I4891 Unspecified atrial fibrillation: Secondary | ICD-10-CM

## 2017-02-10 DIAGNOSIS — I8290 Acute embolism and thrombosis of unspecified vein: Secondary | ICD-10-CM

## 2017-02-10 DIAGNOSIS — I4892 Unspecified atrial flutter: Secondary | ICD-10-CM

## 2017-02-10 LAB — POCT INR: INR: 2.2

## 2017-03-04 ENCOUNTER — Ambulatory Visit (INDEPENDENT_AMBULATORY_CARE_PROVIDER_SITE_OTHER): Payer: Medicare Other | Admitting: *Deleted

## 2017-03-04 DIAGNOSIS — I4891 Unspecified atrial fibrillation: Secondary | ICD-10-CM

## 2017-03-04 DIAGNOSIS — Z5181 Encounter for therapeutic drug level monitoring: Secondary | ICD-10-CM

## 2017-03-04 DIAGNOSIS — I8290 Acute embolism and thrombosis of unspecified vein: Secondary | ICD-10-CM

## 2017-03-04 DIAGNOSIS — I4892 Unspecified atrial flutter: Secondary | ICD-10-CM

## 2017-03-04 LAB — POCT INR: INR: 3.3

## 2017-03-04 MED ORDER — WARFARIN SODIUM 6 MG PO TABS: ORAL_TABLET | ORAL | 1 refills | Status: DC

## 2017-03-04 MED FILL — WARFARIN SODIUM 6 MG TABLET: 6 | 30 days supply | Qty: 35 | Fill #0

## 2017-03-11 MED FILL — METOPROLOL SUCC ER 100 MG T: 100 | 30 days supply | Qty: 60 | Fill #2

## 2017-03-11 MED FILL — DIGOXIN 0.125 MG TABLET: 125 | 30 days supply | Qty: 30 | Fill #2

## 2017-03-11 MED FILL — FUROSEMIDE 80 MG TABLET: 80 | 30 days supply | Qty: 60 | Fill #2

## 2017-03-18 ENCOUNTER — Ambulatory Visit (INDEPENDENT_AMBULATORY_CARE_PROVIDER_SITE_OTHER): Payer: Medicare Other | Admitting: *Deleted

## 2017-03-18 DIAGNOSIS — I8290 Acute embolism and thrombosis of unspecified vein: Secondary | ICD-10-CM

## 2017-03-18 DIAGNOSIS — I4892 Unspecified atrial flutter: Secondary | ICD-10-CM | POA: Diagnosis not present

## 2017-03-18 DIAGNOSIS — Z5181 Encounter for therapeutic drug level monitoring: Secondary | ICD-10-CM

## 2017-03-18 DIAGNOSIS — I4891 Unspecified atrial fibrillation: Secondary | ICD-10-CM | POA: Diagnosis not present

## 2017-03-18 LAB — POCT INR: INR: 3.2

## 2017-04-08 ENCOUNTER — Ambulatory Visit (INDEPENDENT_AMBULATORY_CARE_PROVIDER_SITE_OTHER): Payer: Medicare Other | Admitting: *Deleted

## 2017-04-08 ENCOUNTER — Encounter (INDEPENDENT_AMBULATORY_CARE_PROVIDER_SITE_OTHER): Payer: Self-pay

## 2017-04-08 DIAGNOSIS — Z5181 Encounter for therapeutic drug level monitoring: Secondary | ICD-10-CM

## 2017-04-08 DIAGNOSIS — I4892 Unspecified atrial flutter: Secondary | ICD-10-CM

## 2017-04-08 DIAGNOSIS — I8290 Acute embolism and thrombosis of unspecified vein: Secondary | ICD-10-CM | POA: Diagnosis not present

## 2017-04-08 DIAGNOSIS — I4891 Unspecified atrial fibrillation: Secondary | ICD-10-CM

## 2017-04-08 LAB — POCT INR: INR: 4.7

## 2017-04-14 MED FILL — WARFARIN SODIUM 6 MG TABLET: 6 | 30 days supply | Qty: 35 | Fill #1

## 2017-04-14 MED FILL — DIGOXIN 0.125 MG TABLET: 125 | 30 days supply | Qty: 30 | Fill #3

## 2017-04-14 MED FILL — FUROSEMIDE 80 MG TABLET: 80 | 30 days supply | Qty: 60 | Fill #3

## 2017-04-14 MED FILL — METOPROLOL SUCC ER 100 MG T: 100 | 30 days supply | Qty: 60 | Fill #3

## 2017-05-05 ENCOUNTER — Ambulatory Visit (INDEPENDENT_AMBULATORY_CARE_PROVIDER_SITE_OTHER): Payer: Medicare Other | Admitting: *Deleted

## 2017-05-05 DIAGNOSIS — I4892 Unspecified atrial flutter: Secondary | ICD-10-CM | POA: Diagnosis not present

## 2017-05-05 DIAGNOSIS — Z5181 Encounter for therapeutic drug level monitoring: Secondary | ICD-10-CM | POA: Diagnosis not present

## 2017-05-05 DIAGNOSIS — I4891 Unspecified atrial fibrillation: Secondary | ICD-10-CM | POA: Diagnosis not present

## 2017-05-05 DIAGNOSIS — I8290 Acute embolism and thrombosis of unspecified vein: Secondary | ICD-10-CM

## 2017-05-05 LAB — POCT INR: INR: 2.8

## 2017-05-14 MED FILL — METOPROLOL SUCC ER 100 MG T: 100 | 30 days supply | Qty: 60 | Fill #4

## 2017-05-14 MED FILL — WARFARIN SODIUM 6 MG TABLET: 6 | 30 days supply | Qty: 35 | Fill #0

## 2017-05-14 MED FILL — DIGOXIN 0.125 MG TABLET: 125 | 30 days supply | Qty: 30 | Fill #4

## 2017-05-14 MED FILL — FUROSEMIDE 80 MG TABLET: 80 | 30 days supply | Qty: 60 | Fill #4

## 2017-05-26 ENCOUNTER — Ambulatory Visit (INDEPENDENT_AMBULATORY_CARE_PROVIDER_SITE_OTHER): Payer: Medicare Other | Admitting: Pharmacist

## 2017-05-26 DIAGNOSIS — Z5181 Encounter for therapeutic drug level monitoring: Secondary | ICD-10-CM

## 2017-05-26 DIAGNOSIS — I4891 Unspecified atrial fibrillation: Secondary | ICD-10-CM

## 2017-05-26 DIAGNOSIS — I8290 Acute embolism and thrombosis of unspecified vein: Secondary | ICD-10-CM | POA: Diagnosis not present

## 2017-05-26 DIAGNOSIS — I4892 Unspecified atrial flutter: Secondary | ICD-10-CM

## 2017-05-26 LAB — POCT INR: INR: 3.4

## 2017-06-04 ENCOUNTER — Telehealth (HOSPITAL_COMMUNITY): Payer: Self-pay | Admitting: Cardiology

## 2017-06-04 NOTE — Telephone Encounter (Signed)
Patient called to request entresto samples as he waits on medicare letter   Medication Samples have been provided to the patient.  Drug name: entresto       Strength: 49/51mg         Qty: 28  LOT: [id number]  Exp.Date: 05/2018  Dosing instructions: one tab twice a day  The patient has been instructed regarding the correct time, dose, and frequency of taking this medication, including desired effects and most common side effects.   Magda Bernheim M 4:00 PM 06/04/2017

## 2017-06-18 MED FILL — METOPROLOL SUCC ER 100 MG T: 100 | 30 days supply | Qty: 60 | Fill #5

## 2017-06-18 MED FILL — WARFARIN SODIUM 6 MG TABLET: 6 | 30 days supply | Qty: 35 | Fill #1

## 2017-06-18 MED FILL — FUROSEMIDE 80 MG TABLET: 80 | 30 days supply | Qty: 60 | Fill #5

## 2017-06-18 MED FILL — DIGOXIN 0.125 MG TABLET: 125 | 30 days supply | Qty: 30 | Fill #5

## 2017-06-22 ENCOUNTER — Ambulatory Visit (INDEPENDENT_AMBULATORY_CARE_PROVIDER_SITE_OTHER): Payer: Medicare Other | Admitting: Pharmacist

## 2017-06-22 DIAGNOSIS — I4891 Unspecified atrial fibrillation: Secondary | ICD-10-CM | POA: Diagnosis not present

## 2017-06-22 DIAGNOSIS — I8290 Acute embolism and thrombosis of unspecified vein: Secondary | ICD-10-CM

## 2017-06-22 DIAGNOSIS — I4892 Unspecified atrial flutter: Secondary | ICD-10-CM | POA: Diagnosis not present

## 2017-06-22 DIAGNOSIS — Z5181 Encounter for therapeutic drug level monitoring: Secondary | ICD-10-CM

## 2017-06-22 LAB — POCT INR: INR: 2.6

## 2017-07-02 ENCOUNTER — Telehealth (HOSPITAL_COMMUNITY): Payer: Self-pay | Admitting: Pharmacist

## 2017-07-02 NOTE — Telephone Encounter (Signed)
Patient called to request entresto samples as he waits on medicare letter. He will call [employer] to see if they can send the denial letter again as he never received it.    Medication Samples have been provided to the patient.  Drug name: entresto       Strength: 49/51mg         Qty: 56                  LOT: [id number]                 Exp.Date: 10/20  Dosing instructions: one tab twice a day  The patient has been instructed regarding the correct time, dose, and frequency of taking this medication, including desired effects and most common side effects.   [name]. Bonnye Fava, PharmD, BCPS, CPP Clinical Pharmacist Pager: [phone] Phone: [phone] 07/02/2017 9:42 AM

## 2017-07-20 ENCOUNTER — Ambulatory Visit (INDEPENDENT_AMBULATORY_CARE_PROVIDER_SITE_OTHER): Payer: Medicare Other

## 2017-07-20 ENCOUNTER — Other Ambulatory Visit (HOSPITAL_COMMUNITY): Payer: Self-pay | Admitting: Internal Medicine

## 2017-07-20 DIAGNOSIS — I4892 Unspecified atrial flutter: Secondary | ICD-10-CM

## 2017-07-20 DIAGNOSIS — I8290 Acute embolism and thrombosis of unspecified vein: Secondary | ICD-10-CM

## 2017-07-20 DIAGNOSIS — I4891 Unspecified atrial fibrillation: Secondary | ICD-10-CM | POA: Diagnosis not present

## 2017-07-20 DIAGNOSIS — Z5181 Encounter for therapeutic drug level monitoring: Secondary | ICD-10-CM | POA: Diagnosis not present

## 2017-07-20 LAB — POCT INR: INR: 3.2

## 2017-07-20 MED ORDER — WARFARIN SODIUM 6 MG PO TABS: ORAL_TABLET | ORAL | 1 refills | Status: DC

## 2017-07-20 MED FILL — DIGOXIN 0.125 MG TABLET: 125 | 30 days supply | Qty: 30 | Fill #0

## 2017-07-20 MED FILL — FUROSEMIDE 80 MG TABLET: 80 | 30 days supply | Qty: 60 | Fill #0

## 2017-07-20 MED FILL — WARFARIN SODIUM 6 MG TABLET: 6 | 30 days supply | Qty: 35 | Fill #0

## 2017-07-20 MED FILL — METOPROLOL SUCC ER 100 MG T: 100 | 30 days supply | Qty: 60 | Fill #0

## 2017-08-17 ENCOUNTER — Ambulatory Visit (INDEPENDENT_AMBULATORY_CARE_PROVIDER_SITE_OTHER): Payer: Medicare Other | Admitting: *Deleted

## 2017-08-17 DIAGNOSIS — Z5181 Encounter for therapeutic drug level monitoring: Secondary | ICD-10-CM | POA: Diagnosis not present

## 2017-08-17 DIAGNOSIS — I4892 Unspecified atrial flutter: Secondary | ICD-10-CM

## 2017-08-17 DIAGNOSIS — I8290 Acute embolism and thrombosis of unspecified vein: Secondary | ICD-10-CM | POA: Diagnosis not present

## 2017-08-17 DIAGNOSIS — I4891 Unspecified atrial fibrillation: Secondary | ICD-10-CM | POA: Diagnosis not present

## 2017-08-17 LAB — POCT INR: INR: 2.1

## 2017-08-25 MED FILL — METOPROLOL SUCC ER 100 MG T: 100 | 30 days supply | Qty: 60 | Fill #1

## 2017-08-25 MED FILL — DIGOXIN 0.125 MG TABLET: 125 | 30 days supply | Qty: 30 | Fill #1

## 2017-08-25 MED FILL — FUROSEMIDE 80 MG TABLET: 80 | 30 days supply | Qty: 60 | Fill #1

## 2017-08-25 MED FILL — WARFARIN SODIUM 6 MG TABLET: 6 | 30 days supply | Qty: 35 | Fill #1

## 2017-09-29 ENCOUNTER — Ambulatory Visit (INDEPENDENT_AMBULATORY_CARE_PROVIDER_SITE_OTHER): Payer: Medicare Other | Admitting: Pharmacist

## 2017-09-29 DIAGNOSIS — I4891 Unspecified atrial fibrillation: Secondary | ICD-10-CM | POA: Diagnosis not present

## 2017-09-29 DIAGNOSIS — Z5181 Encounter for therapeutic drug level monitoring: Secondary | ICD-10-CM

## 2017-09-29 DIAGNOSIS — I8290 Acute embolism and thrombosis of unspecified vein: Secondary | ICD-10-CM | POA: Diagnosis not present

## 2017-09-29 DIAGNOSIS — I4892 Unspecified atrial flutter: Secondary | ICD-10-CM

## 2017-09-29 LAB — POCT INR: INR: 2.6

## 2017-09-29 MED FILL — FUROSEMIDE 80 MG TABLET: 80 | 30 days supply | Qty: 60 | Fill #2

## 2017-09-29 MED FILL — DIGOXIN 0.125 MG TABLET: 125 | 30 days supply | Qty: 30 | Fill #2

## 2017-09-29 MED FILL — METOPROLOL SUCC ER 100 MG T: 100 | 30 days supply | Qty: 60 | Fill #2

## 2017-09-29 NOTE — Patient Instructions (Signed)
Continue same dosage 1 tablet daily except 1/2 tablet on Tuesdays and Saturdays. Recheck INR in 4 weeks. Main # [phone] Coumadin Clinic [phone].

## 2017-10-28 ENCOUNTER — Ambulatory Visit (INDEPENDENT_AMBULATORY_CARE_PROVIDER_SITE_OTHER): Payer: Medicare Other | Admitting: *Deleted

## 2017-10-28 DIAGNOSIS — I4892 Unspecified atrial flutter: Secondary | ICD-10-CM | POA: Diagnosis not present

## 2017-10-28 DIAGNOSIS — I8290 Acute embolism and thrombosis of unspecified vein: Secondary | ICD-10-CM

## 2017-10-28 DIAGNOSIS — Z5181 Encounter for therapeutic drug level monitoring: Secondary | ICD-10-CM | POA: Diagnosis not present

## 2017-10-28 DIAGNOSIS — I4891 Unspecified atrial fibrillation: Secondary | ICD-10-CM | POA: Diagnosis not present

## 2017-10-28 LAB — POCT INR: INR: 2.1

## 2017-10-28 MED FILL — DIGOXIN 0.125 MG TABLET: 125 | 30 days supply | Qty: 30 | Fill #3

## 2017-10-28 MED FILL — FUROSEMIDE 80 MG TABLET: 80 | 30 days supply | Qty: 60 | Fill #3

## 2017-10-28 MED FILL — METOPROLOL SUCC ER 100 MG T: 100 | 30 days supply | Qty: 60 | Fill #3

## 2017-10-28 NOTE — Patient Instructions (Signed)
Description   Continue same dosage 1 tablet daily except 1/2 tablet on Tuesdays and Saturdays. Recheck INR in 6 weeks. Main # [phone] Coumadin Clinic [phone].     

## 2017-11-24 ENCOUNTER — Other Ambulatory Visit (HOSPITAL_COMMUNITY): Payer: Self-pay | Admitting: Pharmacist

## 2017-11-24 MED ORDER — SACUBITRIL-VALSARTAN 49-51 MG PO TABS: 1.0000 | ORAL_TABLET | Freq: Two times a day (BID) | ORAL | 3 refills | Status: DC

## 2017-12-06 ENCOUNTER — Other Ambulatory Visit (HOSPITAL_COMMUNITY): Payer: Self-pay | Admitting: Cardiology

## 2017-12-06 MED FILL — DIGOXIN 0.125 MG TABLET: 125 | 30 days supply | Qty: 30 | Fill #4

## 2017-12-06 MED FILL — METOPROLOL SUCCINATE ER 100: 100 | 30 days supply | Qty: 60 | Fill #4

## 2017-12-06 MED FILL — FUROSEMIDE 80 MG TABLET: 80 | 30 days supply | Qty: 60 | Fill #4

## 2017-12-20 ENCOUNTER — Encounter (HOSPITAL_COMMUNITY): Payer: Medicare Other | Admitting: Internal Medicine

## 2018-01-04 ENCOUNTER — Ambulatory Visit (INDEPENDENT_AMBULATORY_CARE_PROVIDER_SITE_OTHER): Payer: Medicare HMO

## 2018-01-04 DIAGNOSIS — I4892 Unspecified atrial flutter: Secondary | ICD-10-CM

## 2018-01-04 DIAGNOSIS — I4891 Unspecified atrial fibrillation: Secondary | ICD-10-CM | POA: Diagnosis not present

## 2018-01-04 DIAGNOSIS — I8290 Acute embolism and thrombosis of unspecified vein: Secondary | ICD-10-CM

## 2018-01-04 DIAGNOSIS — Z5181 Encounter for therapeutic drug level monitoring: Secondary | ICD-10-CM | POA: Diagnosis not present

## 2018-01-04 LAB — POCT INR: INR: 3.6

## 2018-01-04 MED ORDER — WARFARIN SODIUM 6 MG PO TABS: ORAL_TABLET | ORAL | 1 refills | Status: DC

## 2018-01-04 MED FILL — WARFARIN SODIUM 6 MG TABLET: 6 | 30 days supply | Qty: 35 | Fill #0

## 2018-01-04 NOTE — Patient Instructions (Signed)
Description   Skip today's dosage of Coumadin, then resume same dosage 1 tablet daily except 1/2 tablet on Tuesdays and Saturdays. Recheck INR in 4 weeks. Main # [phone] Coumadin Clinic [phone].

## 2018-01-10 ENCOUNTER — Other Ambulatory Visit (HOSPITAL_COMMUNITY): Payer: Self-pay | Admitting: Pharmacist

## 2018-01-10 MED ORDER — SACUBITRIL-VALSARTAN 49-51 MG PO TABS: 1.0000 | ORAL_TABLET | Freq: Two times a day (BID) | ORAL | 5 refills | Status: DC

## 2018-01-10 MED FILL — FUROSEMIDE 80 MG TABLET: 80 | 30 days supply | Qty: 60 | Fill #5

## 2018-01-10 MED FILL — DIGOXIN 0.125 MG TABLET: 125 | 30 days supply | Qty: 30 | Fill #5

## 2018-01-10 MED FILL — METOPROLOL SUCCINATE ER 100: 100 | 30 days supply | Qty: 60 | Fill #5

## 2018-01-21 ENCOUNTER — Telehealth (HOSPITAL_COMMUNITY): Payer: Self-pay | Admitting: Pharmacist

## 2018-01-21 NOTE — Telephone Encounter (Signed)
Entresto PA approval already on file for Mr. Coye through [employer] Part D.   [name]. Bonnye Fava, PharmD, BCPS, CPP Clinical Pharmacist Phone: [phone] 01/21/2018 3:31 PM

## 2018-01-25 ENCOUNTER — Telehealth (HOSPITAL_COMMUNITY): Payer: Self-pay | Admitting: Pharmacist

## 2018-01-25 NOTE — Telephone Encounter (Signed)
Entresto 49-51 mg BID PA approved by [employer] Part D through 01/25/20.    [name]. Bonnye Fava, PharmD, BCPS, CPP Clinical Pharmacist Phone: [phone] 01/25/2018 3:14 PM

## 2018-02-02 ENCOUNTER — Telehealth (HOSPITAL_COMMUNITY): Payer: Self-pay | Admitting: Pharmacist

## 2018-02-02 NOTE — Telephone Encounter (Signed)
Mr. Oba called stating that he cannot afford the $45 copay through [facility] for his Entresto. Have asked him to try to apply for the Medicare LIS again and bring me the denial to see if he would be eligible for patient assistance through [employer]. Otherwise, will enroll in [facility] grant if and when that becomes available again.   [name]. Bonnye Fava, PharmD, BCPS, CPP Clinical Pharmacist Phone: [phone] 02/02/2018 9:57 AM

## 2018-02-09 ENCOUNTER — Other Ambulatory Visit (HOSPITAL_COMMUNITY): Payer: Self-pay | Admitting: Cardiology

## 2018-02-11 MED FILL — FUROSEMIDE 80 MG TABLET: 80 | 30 days supply | Qty: 60 | Fill #0

## 2018-02-11 MED FILL — DIGOXIN 0.125 MG TABLET: 125 | 30 days supply | Qty: 30 | Fill #0

## 2018-02-11 MED FILL — METOPROLOL SUCCINATE ER 100: 100 | 30 days supply | Qty: 60 | Fill #0

## 2018-02-16 ENCOUNTER — Encounter (HOSPITAL_COMMUNITY): Payer: Medicare Other | Admitting: Internal Medicine

## 2018-03-04 ENCOUNTER — Encounter (HOSPITAL_COMMUNITY): Payer: Medicare HMO | Admitting: Internal Medicine

## 2018-03-21 ENCOUNTER — Ambulatory Visit (INDEPENDENT_AMBULATORY_CARE_PROVIDER_SITE_OTHER): Payer: Medicare HMO | Admitting: *Deleted

## 2018-03-21 DIAGNOSIS — I4891 Unspecified atrial fibrillation: Secondary | ICD-10-CM

## 2018-03-21 DIAGNOSIS — I8290 Acute embolism and thrombosis of unspecified vein: Secondary | ICD-10-CM

## 2018-03-21 DIAGNOSIS — Z5181 Encounter for therapeutic drug level monitoring: Secondary | ICD-10-CM | POA: Diagnosis not present

## 2018-03-21 DIAGNOSIS — I4892 Unspecified atrial flutter: Secondary | ICD-10-CM

## 2018-03-21 LAB — POCT INR: INR: 1.9

## 2018-03-21 MED ORDER — WARFARIN SODIUM 6 MG PO TABS: ORAL_TABLET | ORAL | 1 refills | Status: DC

## 2018-03-21 MED FILL — WARFARIN SODIUM 6 MG TABLET: 6 | 30 days supply | Qty: 35 | Fill #0

## 2018-03-21 NOTE — Patient Instructions (Signed)
Description   Today take 1.5 tablets then resume same dosage 1 tablet daily except 1/2 tablet on Tuesdays and Saturdays. Recheck INR in 3 weeks. Main # [phone] Coumadin Clinic [phone].

## 2018-03-22 MED FILL — DIGOXIN 0.125 MG TABLET: 125 | 30 days supply | Qty: 30 | Fill #1

## 2018-03-22 MED FILL — FUROSEMIDE 80 MG TABLET: 80 | 30 days supply | Qty: 60 | Fill #1

## 2018-03-22 MED FILL — METOPROLOL SUCCINATE ER 100: 100 | 30 days supply | Qty: 60 | Fill #1

## 2018-04-21 ENCOUNTER — Other Ambulatory Visit: Payer: Self-pay

## 2018-04-21 ENCOUNTER — Ambulatory Visit (HOSPITAL_COMMUNITY)
Admission: RE | Admit: 2018-04-21 | Discharge: 2018-04-21 | Disposition: A | Discharge status: Home / Self Care | Payer: Medicare HMO | Source: Ambulatory Visit | Attending: Internal Medicine | Admitting: Internal Medicine

## 2018-04-21 VITALS — BP 160/76 | HR 51 | Wt 230.0 lb

## 2018-04-21 DIAGNOSIS — E1122 Type 2 diabetes mellitus with diabetic chronic kidney disease: Secondary | ICD-10-CM | POA: Insufficient documentation

## 2018-04-21 DIAGNOSIS — I130 Hypertensive heart and chronic kidney disease with heart failure and stage 1 through stage 4 chronic kidney disease, or unspecified chronic kidney disease: Secondary | ICD-10-CM | POA: Insufficient documentation

## 2018-04-21 DIAGNOSIS — I480 Paroxysmal atrial fibrillation: Secondary | ICD-10-CM | POA: Insufficient documentation

## 2018-04-21 DIAGNOSIS — Z9119 Patient's noncompliance with other medical treatment and regimen: Secondary | ICD-10-CM | POA: Insufficient documentation

## 2018-04-21 DIAGNOSIS — Z7901 Long term (current) use of anticoagulants: Secondary | ICD-10-CM | POA: Insufficient documentation

## 2018-04-21 DIAGNOSIS — Z79899 Other long term (current) drug therapy: Secondary | ICD-10-CM | POA: Insufficient documentation

## 2018-04-21 DIAGNOSIS — G4733 Obstructive sleep apnea (adult) (pediatric): Secondary | ICD-10-CM | POA: Insufficient documentation

## 2018-04-21 DIAGNOSIS — Z8249 Family history of ischemic heart disease and other diseases of the circulatory system: Secondary | ICD-10-CM | POA: Diagnosis not present

## 2018-04-21 DIAGNOSIS — F29 Unspecified psychosis not due to a substance or known physiological condition: Secondary | ICD-10-CM | POA: Insufficient documentation

## 2018-04-21 DIAGNOSIS — Z09 Encounter for follow-up examination after completed treatment for conditions other than malignant neoplasm: Secondary | ICD-10-CM | POA: Diagnosis not present

## 2018-04-21 DIAGNOSIS — I5022 Chronic systolic (congestive) heart failure: Secondary | ICD-10-CM | POA: Insufficient documentation

## 2018-04-21 DIAGNOSIS — F329 Major depressive disorder, single episode, unspecified: Secondary | ICD-10-CM | POA: Diagnosis not present

## 2018-04-21 DIAGNOSIS — Z9114 Patient's other noncompliance with medication regimen: Secondary | ICD-10-CM | POA: Insufficient documentation

## 2018-04-21 DIAGNOSIS — Z833 Family history of diabetes mellitus: Secondary | ICD-10-CM | POA: Diagnosis not present

## 2018-04-21 DIAGNOSIS — N183 Chronic kidney disease, stage 3 (moderate): Secondary | ICD-10-CM | POA: Diagnosis not present

## 2018-04-21 DIAGNOSIS — E669 Obesity, unspecified: Secondary | ICD-10-CM | POA: Diagnosis not present

## 2018-04-21 LAB — BASIC METABOLIC PANEL
ANION GAP: 10 (ref 5–15)
BUN: 19 mg/dL (ref 6–20)
CALCIUM: 9 mg/dL (ref 8.9–10.3)
CO2: 29 mmol/L (ref 22–32)
Chloride: 96 mmol/L — ABNORMAL LOW (ref 101–111)
Creatinine, Ser: 1.34 mg/dL — ABNORMAL HIGH (ref 0.61–1.24)
GFR calc Af Amer: >60 mL/min (ref >60)
GFR, EST NON AFRICAN AMERICAN: 53 mL/min — AB (ref >60)
Glucose, Bld: 405 mg/dL — ABNORMAL HIGH (ref 65–99)
Potassium: 3.8 mmol/L (ref 3.5–5.1)
SODIUM: 135 mmol/L (ref 135–145)

## 2018-04-21 LAB — DIGOXIN LEVEL: DIGOXIN LVL: 0.6 ng/mL — AB (ref 0.8–2.0)

## 2018-04-21 MED ORDER — SACUBITRIL-VALSARTAN 49-51 MG PO TABS: 1.0000 | ORAL_TABLET | Freq: Two times a day (BID) | ORAL | 3 refills | Status: DC

## 2018-04-21 NOTE — Progress Notes (Signed)
Medication Samples have been provided to the patient.  Drug name: Sherryll Burger     Strength: 49-51mg      Qty: 4  LOT: [id number]  Exp.Date: May 2021  Dosing instructions: Take 1 Tablet by mouth Twice Daily  The patient has been instructed regarding the correct time, dose, and frequency of taking this medication, including desired effects and most common side effects.   Georgina Peer 3:27 PM 04/21/2018

## 2018-04-21 NOTE — Progress Notes (Signed)
Patient ID: [name], male   DOB: 1951/05/12, 67 y.o.   MRN: [id number]    Advanced Heart Failure Clinic Note   PCP: [facility] Primary Cardiologist: Dr. Claiborne Billings  HPI: Mr. Kirsh is a 67 yo male with a history of afib, depression, DM2, CKD stage III, obesity, OSA and chronic systolic HF.  He was admitted 6/30-05/16/14 with dyspena and LE edema. Found to be in afib with RVR. Diuresed with IV lasix and d/c weight 234 lbs.   Admitted 2/18 -01/10/2016 wit volume overload/Afib RVR  in the setting of medication compliance. Diuresed with IV lasix and transitioned to lasix 80 mg twice a day. Had TEE but  DC-CV was cancelled due to LA thrombus. Discharge weight 189 pounds.   Admitted 5/6/-03/23/2016 with  May 6th through May 15th A fib RVR and volume overload in the setting of medication noncompliance. Restarted on HF meds. D/C with volume overload. Psychiatry met with him and he was deemed to have the ability to make decisions. Diuresed with IV lasix and transitioned to lasix 80 mg twice a day. Discharge weight was 216 pounds.   Echo 5/17 EF 15-20%  He returns today for HF follow up. Feels great. Not riding his bike much but doing lots of yard work. Denies CP, SOB, edema, orthopnea or PND. Denies palpitations. Says he is taking all his meds except Entresto which he ran out of.    Myoview 12/31/15  Perfusion: Next defects noted on rest and stress images within the inferior wall and to lesser extent the septum. No reversible defects. Wall Motion: Severe generalized hypokinesia. Left ventricle is dilute Left Ventricular Ejection Fraction: 20 % End diastolic volume 211 ml End systolic volume 155 ml IMPRESSION: 1. 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 No evidence of ischemia. 2. Severe generalized hypokinesia with dilated left ventricle. 3. Left ventricular ejection fraction 20% 4. High-risk stress test findings*.  ROS: All systems negative  except as listed in HPI, PMH and Problem List.  SH:  Social History   Socioeconomic History  . Marital status: Single    Spouse name: Not on file  . Number of children: Not on file  . Years of education: Not on file  . Highest education level: Not on file  Occupational History  . Not on file  Social Needs  . Financial resource strain: Not on file  . Food insecurity:    Worry: Not on file    Inability: Not on file  . Transportation needs:    Medical: Not on file    Non-medical: Not on file  Tobacco Use  . Smoking status: Not on file  Substance and Sexual Activity  . Alcohol use: Yes    Alcohol/week: 0.0 oz  . Drug use: No  . Sexual activity: Not on file  Lifestyle  . Physical activity:    Days per week: Not on file    Minutes per session: Not on file  . Stress: Not on file  Relationships  . Social connections:    Talks on phone: Not on file    Gets together: Not on file    Attends religious service: Not on file    Active member of club or organization: Not on file    Attends meetings of clubs or organizations: Not on file    Relationship status: Not on file  . Intimate partner violence:    Fear of current or ex partner: Not on file  Emotionally abused: Not on file    Physically abused: Not on file    Forced sexual activity: Not on file  Other Topics Concern  . Not on file  Social History Narrative   ** Merged History Encounter **       Lives in [place] by himself. Retired from [employer] and [employer] for [employer].     FH:  Family History  Problem Relation Age of Onset  . Heart attack Mother        deceased  . Diabetes Mother     Past Medical History:  Diagnosis Date  . A-fib ([place])    a. (06/04/14) TEE-DC-CV; succesful; large LA 6.2 cm  . A-fib ([place])   . Ascites   . CHF (congestive heart failure) ([place])   . Chronic systolic heart failure ([place])    a. ECHO (05/2014): EF 25-30%, diff HK, RV midly dilated and sys fx mildly/mod reduced  . CKD  (chronic kidney disease)   . CKD (chronic kidney disease) stage 3, GFR 30-59 ml/min   . Depression   . Diabetes mellitus without complication ([place])   . HTN (hypertension)   . Hypertension   . OSA (obstructive sleep apnea)   . Type 2 diabetes mellitus ([place])     Current Outpatient Medications  Medication Sig Dispense Refill  . digoxin (LANOXIN) 0.125 MG tablet TAKE 1 TABLET BY MOUTH DAILY. 30 tablet 5  . furosemide (LASIX) 80 MG tablet TAKE 1 TABLET BY MOUTH 2 TIMES DAILY. 60 tablet 5  . metoprolol succinate (TOPROL-XL) 100 MG 24 hr tablet TAKE 1 TABLET BY MOUTH 2 TIMES DAILY TAKE WITH OR IMMEDIATELY FOLLOWING A MEAL. 60 tablet 5  . warfarin (COUMADIN) 6 MG tablet TAKE AS DIRECTED BY COUMADIN CLINIC 35 tablet 1   No current facility-administered medications for this encounter.     Vitals:   04/21/18 1447  BP: (!) 160/76  Pulse: (!) 51  SpO2: 98%    Wt Readings from Last 3 Encounters:  04/21/18 230 lb (104.3 kg)  10/15/16 231 lb 8 oz (105 kg)  09/16/16 226 lb 6.4 oz (102.7 kg)   PHYSICAL EXAM: General:  Well appearing. No resp difficulty HEENT: normal Neck: supple. no JVD. Carotids 2+ bilat; no bruits. No lymphadenopathy or thryomegaly appreciated. Cor: PMI nondisplaced. Regular rate & rhythm. No rubs, gallops or murmurs. Lungs: clear Abdomen: obese, soft, nontender, nondistended. No hepatosplenomegaly. No bruits or masses. Good bowel sounds. Extremities: no cyanosis, clubbing, rash, trace edema. Chronic venous stasis changes  Neuro: alert & orientedx3, cranial nerves grossly intact. moves all 4 extremities w/o difficulty. Affect pleasant  ECG: Sinus brady 55. u waves. nonspecific TWI Personally reviewed   ASSESSMENT & PLAN:  1) Chronic systolic HF: 0/11/270 EF 53-66% Diffuse HK, LV thrombus.  - Myoview 12/2015. EF 20% with defects noted on rest and stress images within the inferior wall and to lesser extent the septum suggest ischemic etiology. No  reversibility. -Continues to refuse cath. Discussed again today.  - Will check echo  - NYHA I-II despite running out of Entresto  - Volume status ok  - Restart entresto 49-51 mg twice a day. - Continue lasix 80 bid. Volume status ok - Continue Toprol XL 100 mg BID and digoxin 0.125 mg daily.  - No spiro due to noncompliance.   - Reinforced medication compliance. Marland Kitchen  2) HTN - Elevated today in setting of being out of Entresto. Will restart 3) Paroxysmal Afib - In 2015 had DC-CV. Recurrent AF  in February 2017 and TEE but not cardioversion due to LV thrombus.  - in NSR today on ECG - Continue coumadin and Toprol XL. - Gets INR checked. No bleeding  4) ?OSA - Ongoing daytime sleepiness and fatigue. Refused Sleep Study.    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  - INR per coumadin clinic.  6) CKD stage III - recheck labs today 7) Depression/Psychosis  - Has hx of depression. Likely co-conditions as well. See below.  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  Glori Bickers MD 2:55 PM

## 2018-04-21 NOTE — Patient Instructions (Signed)
Labs today (will call for abnormal results, otherwise no news is good news)  RESTART Entresto 49-51 mg (1 Tablet) Twice daily, samples provided.   Echocardiogram has been ordered for you, we'll schedule at checkout.   Follow up in 6 months. Please call our clinic in October/November to schedule appointment for December. Call [phone], Option 3.

## 2018-04-22 ENCOUNTER — Ambulatory Visit (INDEPENDENT_AMBULATORY_CARE_PROVIDER_SITE_OTHER): Payer: Medicare HMO | Admitting: Pharmacist

## 2018-04-22 DIAGNOSIS — I4892 Unspecified atrial flutter: Secondary | ICD-10-CM

## 2018-04-22 DIAGNOSIS — Z5181 Encounter for therapeutic drug level monitoring: Secondary | ICD-10-CM | POA: Diagnosis not present

## 2018-04-22 LAB — POCT INR: INR: 2.9 (ref 2.0–3.0)

## 2018-04-22 MED FILL — FUROSEMIDE 80 MG TABLET: 80 | 30 days supply | Qty: 60 | Fill #2

## 2018-04-22 MED FILL — DIGOXIN 0.125 MG TABLET: 125 | 30 days supply | Qty: 30 | Fill #2

## 2018-04-22 MED FILL — METOPROLOL SUCCINATE ER 100: 100 | 30 days supply | Qty: 60 | Fill #2

## 2018-04-22 NOTE — Patient Instructions (Signed)
Description   Continue same dosage 1 tablet daily except 1/2 tablet on Tuesdays and Saturdays. Recheck INR in 4 weeks. Main # [phone] Coumadin Clinic [phone].

## 2018-05-20 ENCOUNTER — Ambulatory Visit (INDEPENDENT_AMBULATORY_CARE_PROVIDER_SITE_OTHER): Payer: Medicare HMO | Admitting: *Deleted

## 2018-05-20 DIAGNOSIS — I8290 Acute embolism and thrombosis of unspecified vein: Secondary | ICD-10-CM

## 2018-05-20 DIAGNOSIS — I4892 Unspecified atrial flutter: Secondary | ICD-10-CM

## 2018-05-20 DIAGNOSIS — I4891 Unspecified atrial fibrillation: Secondary | ICD-10-CM

## 2018-05-20 DIAGNOSIS — Z5181 Encounter for therapeutic drug level monitoring: Secondary | ICD-10-CM

## 2018-05-20 LAB — POCT INR: INR: 2.4 (ref 2.0–3.0)

## 2018-05-20 MED ORDER — WARFARIN SODIUM 6 MG PO TABS: ORAL_TABLET | ORAL | 0 refills | Status: DC

## 2018-05-20 MED FILL — WARFARIN SODIUM 6 MG TABLET: 6 | 30 days supply | Qty: 35 | Fill #0

## 2018-05-20 NOTE — Patient Instructions (Signed)
Description   Pt has already taken coumadin 1.25mg  today July 12th instructed to take another 1/2 tablet(3mg ) today then continue same dosage 1 tablet daily except 1/2 tablet on Tuesdays and Saturdays. Recheck INR in 4 weeks. Main # [phone] Coumadin Clinic [phone].

## 2018-05-30 MED FILL — DIGOXIN 0.125 MG TABLET: 125 | 30 days supply | Qty: 30 | Fill #3

## 2018-05-30 MED FILL — METOPROLOL SUCCINATE ER 100: 100 | 30 days supply | Qty: 60 | Fill #3

## 2018-05-30 MED FILL — FUROSEMIDE 80 MG TABLET: 80 | 30 days supply | Qty: 60 | Fill #3

## 2018-06-15 ENCOUNTER — Ambulatory Visit (HOSPITAL_COMMUNITY): Payer: Medicare HMO

## 2018-06-23 ENCOUNTER — Ambulatory Visit (HOSPITAL_COMMUNITY): Payer: Medicare HMO

## 2018-07-01 ENCOUNTER — Ambulatory Visit (HOSPITAL_COMMUNITY): Payer: Medicare HMO

## 2018-07-05 MED FILL — WARFARIN SODIUM 6 MG TABLET: 6 | 30 days supply | Qty: 35 | Fill #1

## 2018-07-05 MED FILL — METOPROLOL SUCCINATE ER 100: 100 | 30 days supply | Qty: 60 | Fill #4

## 2018-07-05 MED FILL — DIGOXIN 0.125 MG TABLET: 125 | 30 days supply | Qty: 30 | Fill #4

## 2018-07-05 MED FILL — FUROSEMIDE 80 MG TABLET: 80 | 30 days supply | Qty: 60 | Fill #4

## 2018-07-08 ENCOUNTER — Ambulatory Visit (HOSPITAL_COMMUNITY): Admission: RE | Admit: 2018-07-08 | Payer: Medicare HMO | Source: Ambulatory Visit

## 2018-07-27 ENCOUNTER — Ambulatory Visit (HOSPITAL_COMMUNITY): Payer: Medicare HMO

## 2018-08-09 MED FILL — METOPROLOL SUCCINATE ER 100: 100 | 30 days supply | Qty: 60 | Fill #5

## 2018-08-09 MED FILL — WARFARIN SODIUM 6 MG TABLET: 6 | 30 days supply | Qty: 35 | Fill #1

## 2018-08-09 MED FILL — DIGOXIN 0.125 MG TABLET: 125 | 30 days supply | Qty: 30 | Fill #5

## 2018-08-09 MED FILL — FUROSEMIDE 80 MG TABLET: 80 | 30 days supply | Qty: 60 | Fill #5

## 2018-09-12 ENCOUNTER — Other Ambulatory Visit: Payer: Self-pay | Admitting: *Deleted

## 2018-09-12 ENCOUNTER — Other Ambulatory Visit (HOSPITAL_COMMUNITY): Payer: Self-pay | Admitting: Cardiology

## 2018-09-12 MED FILL — METOPROLOL SUCCINATE ER 100: 100 | 30 days supply | Qty: 60 | Fill #0

## 2018-09-12 MED FILL — FUROSEMIDE 80 MG TABLET: 80 | 30 days supply | Qty: 60 | Fill #0

## 2018-09-12 MED FILL — DIGOXIN 0.125 MG TABLET: 125 | 30 days supply | Qty: 30 | Fill #0

## 2018-09-21 ENCOUNTER — Ambulatory Visit (INDEPENDENT_AMBULATORY_CARE_PROVIDER_SITE_OTHER): Payer: Medicare HMO | Admitting: Pharmacist

## 2018-09-21 DIAGNOSIS — I4891 Unspecified atrial fibrillation: Secondary | ICD-10-CM | POA: Diagnosis not present

## 2018-09-21 DIAGNOSIS — Z5181 Encounter for therapeutic drug level monitoring: Secondary | ICD-10-CM | POA: Diagnosis not present

## 2018-09-21 DIAGNOSIS — I8290 Acute embolism and thrombosis of unspecified vein: Secondary | ICD-10-CM

## 2018-09-21 DIAGNOSIS — I4892 Unspecified atrial flutter: Secondary | ICD-10-CM | POA: Diagnosis not present

## 2018-09-21 LAB — POCT INR: INR: 2.2 (ref 2.0–3.0)

## 2018-09-21 MED ORDER — WARFARIN SODIUM 6 MG PO TABS: ORAL_TABLET | ORAL | 0 refills | Status: DC

## 2018-09-21 MED FILL — WARFARIN SODIUM 6 MG TABLET: 6 | 30 days supply | Qty: 35 | Fill #0

## 2018-09-21 NOTE — Patient Instructions (Signed)
Description   Continue same dosage 1 tablet daily except 1/2 tablet on Tuesdays and Saturdays. Recheck INR in 6 weeks. Main # [phone] Coumadin Clinic [phone].

## 2018-10-17 MED FILL — METOPROLOL SUCCINATE ER 100: 100 | 30 days supply | Qty: 60 | Fill #1

## 2018-10-17 MED FILL — DIGOXIN 0.125 MG TABLET: 125 | 30 days supply | Qty: 30 | Fill #1

## 2018-10-17 MED FILL — FUROSEMIDE 80 MG TABLET: 80 | 30 days supply | Qty: 60 | Fill #1

## 2018-11-11 ENCOUNTER — Telehealth: Payer: Self-pay | Admitting: Pharmacist

## 2018-11-11 MED ORDER — WARFARIN SODIUM 6 MG PO TABS: ORAL_TABLET | ORAL | 0 refills | Status: DC

## 2018-11-11 MED FILL — WARFARIN SODIUM 6 MG TABLET: 6 | 4 days supply | Qty: 4 | Fill #0

## 2018-11-11 NOTE — Telephone Encounter (Signed)
New message     Pt has been out of coumadin since wed. Pt has an appt on 1/6. [facility]

## 2018-11-14 ENCOUNTER — Ambulatory Visit (INDEPENDENT_AMBULATORY_CARE_PROVIDER_SITE_OTHER): Payer: Medicare HMO

## 2018-11-14 DIAGNOSIS — Z5181 Encounter for therapeutic drug level monitoring: Secondary | ICD-10-CM

## 2018-11-14 DIAGNOSIS — I8290 Acute embolism and thrombosis of unspecified vein: Secondary | ICD-10-CM

## 2018-11-14 DIAGNOSIS — I4891 Unspecified atrial fibrillation: Secondary | ICD-10-CM | POA: Diagnosis not present

## 2018-11-14 DIAGNOSIS — I4892 Unspecified atrial flutter: Secondary | ICD-10-CM | POA: Diagnosis not present

## 2018-11-14 LAB — POCT INR: INR: 3.3 — AB (ref 2.0–3.0)

## 2018-11-14 MED ORDER — WARFARIN SODIUM 6 MG PO TABS: ORAL_TABLET | ORAL | 0 refills | Status: DC

## 2018-11-14 MED FILL — WARFARIN SODIUM 6 MG TABLET: 6 | 30 days supply | Qty: 30 | Fill #0

## 2018-11-14 NOTE — Patient Instructions (Signed)
Description   Hold your dose tomorrow then continue same dosage of 1 tablet daily except 1/2 tablet on Tuesdays and Saturdays. Recheck INR in 6 weeks. Main # [phone] Coumadin Clinic [phone].

## 2018-11-16 MED FILL — METOPROLOL SUCCINATE ER 100: 100 | 30 days supply | Qty: 60 | Fill #2

## 2018-11-16 MED FILL — FUROSEMIDE 80 MG TABLET: 80 | 30 days supply | Qty: 60 | Fill #2

## 2018-11-16 MED FILL — DIGOXIN 0.125 MG TABLET: 125 | 30 days supply | Qty: 30 | Fill #2

## 2018-12-23 ENCOUNTER — Ambulatory Visit (INDEPENDENT_AMBULATORY_CARE_PROVIDER_SITE_OTHER): Payer: Medicare HMO | Admitting: Pharmacist

## 2018-12-23 DIAGNOSIS — I8290 Acute embolism and thrombosis of unspecified vein: Secondary | ICD-10-CM

## 2018-12-23 DIAGNOSIS — I4891 Unspecified atrial fibrillation: Secondary | ICD-10-CM | POA: Diagnosis not present

## 2018-12-23 DIAGNOSIS — Z5181 Encounter for therapeutic drug level monitoring: Secondary | ICD-10-CM | POA: Diagnosis not present

## 2018-12-23 DIAGNOSIS — I4892 Unspecified atrial flutter: Secondary | ICD-10-CM | POA: Diagnosis not present

## 2018-12-23 LAB — POCT INR: INR: 2 (ref 2.0–3.0)

## 2018-12-23 MED ORDER — WARFARIN SODIUM 6 MG PO TABS
ORAL_TABLET | ORAL | 0 refills | Status: DC
Start: 2018-12-23 — End: 2019-01-24

## 2018-12-23 MED FILL — METOPROLOL SUCCINATE ER 100: 100 | 30 days supply | Qty: 60 | Fill #3

## 2018-12-23 MED FILL — WARFARIN SODIUM 6 MG TABLET: 6 | 30 days supply | Qty: 30 | Fill #0

## 2018-12-23 MED FILL — FUROSEMIDE 80 MG TABLET: 80 | 30 days supply | Qty: 60 | Fill #3

## 2018-12-23 MED FILL — DIGOXIN 0.125 MG TABLET: 125 | 30 days supply | Qty: 30 | Fill #3

## 2018-12-23 NOTE — Patient Instructions (Signed)
Description   Continue same dosage of 1 tablet daily except 1/2 tablet on Tuesdays and Saturdays. Recheck INR in 4 weeks. Main # [phone] Coumadin Clinic [phone].     

## 2019-01-02 MED FILL — METOPROLOL SUCCINATE ER 100: 100 | 20 days supply | Qty: 40 | Fill #4

## 2019-01-24 ENCOUNTER — Other Ambulatory Visit: Payer: Self-pay

## 2019-01-24 ENCOUNTER — Ambulatory Visit (INDEPENDENT_AMBULATORY_CARE_PROVIDER_SITE_OTHER): Payer: Medicare HMO | Admitting: *Deleted

## 2019-01-24 DIAGNOSIS — Z5181 Encounter for therapeutic drug level monitoring: Secondary | ICD-10-CM | POA: Diagnosis not present

## 2019-01-24 DIAGNOSIS — I4892 Unspecified atrial flutter: Secondary | ICD-10-CM

## 2019-01-24 DIAGNOSIS — I8290 Acute embolism and thrombosis of unspecified vein: Secondary | ICD-10-CM

## 2019-01-24 DIAGNOSIS — I4891 Unspecified atrial fibrillation: Secondary | ICD-10-CM | POA: Diagnosis not present

## 2019-01-24 LAB — POCT INR: INR: 2.9 (ref 2.0–3.0)

## 2019-01-24 MED ORDER — WARFARIN SODIUM 6 MG PO TABS: ORAL_TABLET | ORAL | 1 refills | Status: DC

## 2019-01-24 MED FILL — FUROSEMIDE 80 MG TABLET: 80 | 30 days supply | Qty: 60 | Fill #4 | Status: TO

## 2019-01-24 MED FILL — WARFARIN SODIUM 6 MG TABLET: 6 | 30 days supply | Qty: 30 | Fill #0 | Status: TO

## 2019-01-24 MED FILL — DIGOXIN 0.125 MG TABLET: 125 | 30 days supply | Qty: 30 | Fill #4 | Status: TO

## 2019-01-24 MED FILL — METOPROLOL SUCCINATE ER 100: 100 | 30 days supply | Qty: 60 | Fill #5 | Status: TO

## 2019-01-24 NOTE — Patient Instructions (Addendum)
Description   Continue same dosage of 1 tablet daily except 1/2 tablet on Tuesdays and Saturdays. Recheck INR in 5 weeks. Main # [phone] Cou[facility]in Clinic [phone].    

## 2019-02-27 ENCOUNTER — Telehealth: Payer: Self-pay

## 2019-02-27 ENCOUNTER — Other Ambulatory Visit (HOSPITAL_COMMUNITY): Payer: Self-pay | Admitting: Cardiology

## 2019-02-27 MED FILL — METOPROLOL SUCCINATE ER 100: 100 | 30 days supply | Qty: 60 | Fill #0

## 2019-02-27 MED FILL — WARFARIN SODIUM 6 MG TABLET: 6 | 30 days supply | Qty: 30 | Fill #0

## 2019-02-27 MED FILL — DIGOXIN 0.125 MG TABLET: 125 | 30 days supply | Qty: 30 | Fill #0

## 2019-02-27 MED FILL — FUROSEMIDE 80 MG TAB: 80 | 30 days supply | Qty: 60 | Fill #0

## 2019-02-27 NOTE — Telephone Encounter (Signed)
lmom for prescreen  

## 2019-02-28 ENCOUNTER — Telehealth: Payer: Self-pay | Admitting: *Deleted

## 2019-02-28 NOTE — Telephone Encounter (Signed)
Called the pt and had to leave a voicemail stating that he needs to change his appt for today as the Clinic closed at 1pm. Advised to call us back at [phone]; will await a call back from pt.

## 2019-02-28 NOTE — Telephone Encounter (Signed)
Called the pt to inform him that we will not be open at 215p today when his appt is scheduled; had to leave him a message stating that info and that we are operating on limited hours. The hours of 9a-1245p today and tomorrow and to call our office back at [phone]; will await a call back from the patient.

## 2019-02-28 NOTE — Telephone Encounter (Signed)
Left a msg for pt to r/s his Coumadin Clinic appt as the clinic closes at 1pm.

## 2019-03-03 ENCOUNTER — Telehealth: Payer: Self-pay

## 2019-03-03 NOTE — Telephone Encounter (Signed)

## 2019-03-31 ENCOUNTER — Other Ambulatory Visit: Payer: Self-pay | Admitting: Internal Medicine

## 2019-03-31 ENCOUNTER — Other Ambulatory Visit (HOSPITAL_COMMUNITY): Payer: Self-pay | Admitting: Cardiology

## 2019-04-04 ENCOUNTER — Other Ambulatory Visit (HOSPITAL_COMMUNITY): Payer: Self-pay | Admitting: Cardiology

## 2019-04-04 MED FILL — DIGOXIN 0.125 MG TABLET: 125 | 30 days supply | Qty: 30 | Fill #0

## 2019-04-04 MED FILL — METOPROLOL SUCCINATE ER 100: 100 | 30 days supply | Qty: 60 | Fill #0

## 2019-04-04 MED FILL — WARFARIN SODIUM 6 MG TABLET: 6 | 30 days supply | Qty: 30 | Fill #0

## 2019-04-05 MED FILL — FUROSEMIDE 80 MG TAB: 80 | 30 days supply | Qty: 60 | Fill #0

## 2019-04-07 ENCOUNTER — Telehealth: Payer: Self-pay | Admitting: Pharmacist

## 2019-04-07 NOTE — Telephone Encounter (Signed)
Patient called to make aware of appointment time and that it will be held in clinic. LVM with callback number.

## 2019-04-10 ENCOUNTER — Telehealth: Payer: Self-pay

## 2019-04-10 NOTE — Telephone Encounter (Signed)

## 2019-05-02 ENCOUNTER — Other Ambulatory Visit: Payer: Self-pay | Admitting: Internal Medicine

## 2019-05-02 MED FILL — FUROSEMIDE 80 MG TABLET: 80 | 30 days supply | Qty: 60 | Fill #0

## 2019-05-02 MED FILL — METOPROLOL SUCCINATE ER 100: 100 | 30 days supply | Qty: 60 | Fill #0

## 2019-05-02 MED FILL — WARFARIN SODIUM 6 MG TABLET: 6 | 30 days supply | Qty: 30 | Fill #0

## 2019-05-02 MED FILL — DIGOXIN 0.125 MG TABLET: 125 | 30 days supply | Qty: 30 | Fill #0

## 2019-06-05 ENCOUNTER — Other Ambulatory Visit (HOSPITAL_COMMUNITY): Payer: Self-pay | Admitting: Cardiology

## 2019-06-05 ENCOUNTER — Other Ambulatory Visit: Payer: Self-pay | Admitting: Internal Medicine

## 2019-06-05 MED FILL — FUROSEMIDE 80 MG TABLET: 80 | 30 days supply | Qty: 60 | Fill #0

## 2019-06-05 MED FILL — DIGOXIN 0.125 MG TABLET: 125 | 30 days supply | Qty: 30 | Fill #1

## 2019-06-05 MED FILL — METOPROLOL SUCCINATE ER 100: 100 | 30 days supply | Qty: 60 | Fill #0

## 2019-06-06 ENCOUNTER — Telehealth: Payer: Self-pay | Admitting: *Deleted

## 2019-06-06 NOTE — Telephone Encounter (Signed)

## 2019-06-09 ENCOUNTER — Other Ambulatory Visit: Payer: Self-pay

## 2019-06-09 ENCOUNTER — Ambulatory Visit (INDEPENDENT_AMBULATORY_CARE_PROVIDER_SITE_OTHER): Payer: Medicare HMO | Admitting: *Deleted

## 2019-06-09 DIAGNOSIS — I8290 Acute embolism and thrombosis of unspecified vein: Secondary | ICD-10-CM

## 2019-06-09 DIAGNOSIS — I4892 Unspecified atrial flutter: Secondary | ICD-10-CM | POA: Diagnosis not present

## 2019-06-09 DIAGNOSIS — Z5181 Encounter for therapeutic drug level monitoring: Secondary | ICD-10-CM

## 2019-06-09 DIAGNOSIS — I4891 Unspecified atrial fibrillation: Secondary | ICD-10-CM

## 2019-06-09 LAB — POCT INR: INR: 4.6 — AB (ref 2.0–3.0)

## 2019-06-09 MED ORDER — WARFARIN SODIUM 6 MG PO TABS
ORAL_TABLET | ORAL | 0 refills | Status: DC
Start: 2019-06-09 — End: 2019-07-04

## 2019-06-09 MED FILL — WARFARIN SODIUM 6 MG TABLET: 6 | 30 days supply | Qty: 30 | Fill #0

## 2019-06-09 NOTE — Patient Instructions (Signed)
Description   Hold today and tomorrow, then continue same dosage of 1 tablet daily except 1/2 tablet on Tuesdays and Saturdays. Recheck INR in 2 weeks. Main # [phone] [facility] [phone].

## 2019-06-09 NOTE — Telephone Encounter (Signed)
Pt is overdue, has appt today, will wait for pt to show before sending in refill.

## 2019-07-03 ENCOUNTER — Other Ambulatory Visit: Payer: Self-pay | Admitting: Internal Medicine

## 2019-07-03 ENCOUNTER — Other Ambulatory Visit (HOSPITAL_COMMUNITY): Payer: Self-pay | Admitting: Cardiology

## 2019-07-03 DIAGNOSIS — Z5181 Encounter for therapeutic drug level monitoring: Secondary | ICD-10-CM

## 2019-07-03 DIAGNOSIS — I4891 Unspecified atrial fibrillation: Secondary | ICD-10-CM

## 2019-07-03 MED FILL — DIGOXIN 0.125 MG TABLET: 125 | 30 days supply | Qty: 30 | Fill #2

## 2019-07-04 MED FILL — WARFARIN SODIUM 6 MG TABLET: 6 | 30 days supply | Qty: 30 | Fill #0

## 2019-07-04 MED FILL — FUROSEMIDE 80 MG TABLET: 80 | 30 days supply | Qty: 60 | Fill #0

## 2019-07-04 MED FILL — METOPROLOL SUCCINATE ER 100: 100 | 30 days supply | Qty: 60 | Fill #0

## 2019-08-04 ENCOUNTER — Other Ambulatory Visit: Payer: Self-pay | Admitting: Internal Medicine

## 2019-08-04 ENCOUNTER — Other Ambulatory Visit (HOSPITAL_COMMUNITY): Payer: Self-pay | Admitting: Cardiology

## 2019-08-04 MED FILL — FUROSEMIDE 80 MG TABS: 80 | 30 days supply | Qty: 60 | Fill #0

## 2019-08-04 MED FILL — METOPROLOL SUCCINATE ER 100: 100 | 30 days supply | Qty: 60 | Fill #0

## 2019-08-04 MED FILL — DIGOXIN 0.125 MG TABLET: 125 | 30 days supply | Qty: 30 | Fill #3

## 2019-08-18 ENCOUNTER — Other Ambulatory Visit: Payer: Self-pay | Admitting: Internal Medicine

## 2019-08-18 DIAGNOSIS — Z5181 Encounter for therapeutic drug level monitoring: Secondary | ICD-10-CM

## 2019-08-18 DIAGNOSIS — I4891 Unspecified atrial fibrillation: Secondary | ICD-10-CM

## 2019-08-21 NOTE — Telephone Encounter (Signed)
Overdue for follow-up, last seen 06/09/19, cancelled appt 814, 8/19, and 8/20.  Attempted to contact pt and reschedule appt, LMOM TCB for appt.

## 2019-08-23 NOTE — Telephone Encounter (Signed)
Called pt and he states he is getting ready for a family house inspection and has been busy. He states the pharmacy gave him a week worth of pills and he will run out on Friday. Advised that we need to see him and he agreed to come to an appt on Thursday at 3pm. Pt is aware that we cannot send in a refill without having him INR monitored.

## 2019-08-24 ENCOUNTER — Encounter (INDEPENDENT_AMBULATORY_CARE_PROVIDER_SITE_OTHER): Payer: Self-pay

## 2019-08-24 ENCOUNTER — Ambulatory Visit (INDEPENDENT_AMBULATORY_CARE_PROVIDER_SITE_OTHER): Payer: Medicare HMO | Admitting: Pharmacist

## 2019-08-24 ENCOUNTER — Other Ambulatory Visit: Payer: Self-pay

## 2019-08-24 DIAGNOSIS — I4891 Unspecified atrial fibrillation: Secondary | ICD-10-CM | POA: Diagnosis not present

## 2019-08-24 DIAGNOSIS — Z5181 Encounter for therapeutic drug level monitoring: Secondary | ICD-10-CM

## 2019-08-24 DIAGNOSIS — I513 Intracardiac thrombosis, not elsewhere classified: Secondary | ICD-10-CM

## 2019-08-24 DIAGNOSIS — I8290 Acute embolism and thrombosis of unspecified vein: Secondary | ICD-10-CM

## 2019-08-24 DIAGNOSIS — I4892 Unspecified atrial flutter: Secondary | ICD-10-CM

## 2019-08-24 LAB — POCT INR: INR: 2.3 (ref 2.0–3.0)

## 2019-08-24 MED FILL — WARFARIN SODIUM 6 MG TABLET: 6 | 30 days supply | Qty: 30 | Fill #0

## 2019-08-24 NOTE — Patient Instructions (Signed)
Continue same dosage of 1 tablet daily except 1/2 tablet on Tuesdays and Saturdays. Recheck INR in 4 weeks. Main # [phone] Coumadin Clinic [phone].

## 2019-09-11 ENCOUNTER — Other Ambulatory Visit: Payer: Self-pay | Admitting: Internal Medicine

## 2019-09-11 ENCOUNTER — Other Ambulatory Visit (HOSPITAL_COMMUNITY): Payer: Self-pay | Admitting: Cardiology

## 2019-09-11 MED FILL — DIGOXIN 0.125 MG TABLET: 125 | 30 days supply | Qty: 30 | Fill #4

## 2019-09-12 MED FILL — METOPROLOL SUCCINATE ER 100: 100 | 30 days supply | Qty: 60 | Fill #0

## 2019-09-12 MED FILL — FUROSEMIDE 80 MG TAB: 80 | 30 days supply | Qty: 60 | Fill #0

## 2019-09-26 ENCOUNTER — Ambulatory Visit (INDEPENDENT_AMBULATORY_CARE_PROVIDER_SITE_OTHER): Payer: Medicare HMO | Admitting: *Deleted

## 2019-09-26 ENCOUNTER — Other Ambulatory Visit: Payer: Self-pay

## 2019-09-26 DIAGNOSIS — I8290 Acute embolism and thrombosis of unspecified vein: Secondary | ICD-10-CM

## 2019-09-26 DIAGNOSIS — I4891 Unspecified atrial fibrillation: Secondary | ICD-10-CM | POA: Diagnosis not present

## 2019-09-26 DIAGNOSIS — Z5181 Encounter for therapeutic drug level monitoring: Secondary | ICD-10-CM

## 2019-09-26 DIAGNOSIS — I4892 Unspecified atrial flutter: Secondary | ICD-10-CM

## 2019-09-26 LAB — POCT INR: INR: 2.5 (ref 2.0–3.0)

## 2019-09-26 NOTE — Patient Instructions (Addendum)
Description   Continue same dosage of 1 tablet daily except 1/2 tablet on Tuesdays and Saturdays. Recheck INR in 4 weeks. Main # [phone] [facility] [phone].     

## 2019-10-04 ENCOUNTER — Other Ambulatory Visit: Payer: Self-pay | Admitting: Cardiovascular Disease

## 2019-10-04 DIAGNOSIS — Z5181 Encounter for therapeutic drug level monitoring: Secondary | ICD-10-CM

## 2019-10-04 DIAGNOSIS — I4891 Unspecified atrial fibrillation: Secondary | ICD-10-CM

## 2019-10-04 MED FILL — WARFARIN SODIUM 6 MG TABLET: 6 | 30 days supply | Qty: 30 | Fill #0

## 2019-10-04 NOTE — Telephone Encounter (Signed)
° ° ° °*  STAT* If patient is at the pharmacy, call can be transferred to refill team.   1. Which medications need to be refilled? (please list name of each medication and dose if known) warfarin (COUMADIN) 6 MG tablet  2. Which pharmacy/location (including street and city if local pharmacy) is medication to be sent to? [facility]  3. Do they need a 30 day or 90 day supply?

## 2019-10-17 ENCOUNTER — Other Ambulatory Visit: Payer: Self-pay | Admitting: Internal Medicine

## 2019-10-17 ENCOUNTER — Other Ambulatory Visit (HOSPITAL_COMMUNITY): Payer: Self-pay | Admitting: Cardiology

## 2019-10-17 MED FILL — FUROSEMIDE 80 MG TAB: 80 | 30 days supply | Qty: 60 | Fill #0

## 2019-10-17 MED FILL — DIGOXIN 0.125 MG TABLET: 125 | 30 days supply | Qty: 30 | Fill #0

## 2019-10-17 MED FILL — METOPROLOL SUCCINATE ER 100: 100 | 30 days supply | Qty: 60 | Fill #0

## 2019-11-14 ENCOUNTER — Other Ambulatory Visit: Payer: Self-pay

## 2019-11-14 ENCOUNTER — Other Ambulatory Visit: Payer: Self-pay | Admitting: Cardiovascular Disease

## 2019-11-14 ENCOUNTER — Ambulatory Visit (INDEPENDENT_AMBULATORY_CARE_PROVIDER_SITE_OTHER): Payer: Medicare HMO | Admitting: *Deleted

## 2019-11-14 DIAGNOSIS — I4891 Unspecified atrial fibrillation: Secondary | ICD-10-CM | POA: Diagnosis not present

## 2019-11-14 DIAGNOSIS — Z5181 Encounter for therapeutic drug level monitoring: Secondary | ICD-10-CM

## 2019-11-14 DIAGNOSIS — I8290 Acute embolism and thrombosis of unspecified vein: Secondary | ICD-10-CM

## 2019-11-14 DIAGNOSIS — I4892 Unspecified atrial flutter: Secondary | ICD-10-CM | POA: Diagnosis not present

## 2019-11-14 LAB — POCT INR: INR: 2 (ref 2.0–3.0)

## 2019-11-14 MED ORDER — WARFARIN SODIUM 6 MG PO TABS: ORAL_TABLET | ORAL | 0 refills | Status: DC

## 2019-11-14 MED FILL — WARFARIN SODIUM 6 MG TABLET: 6 | 30 days supply | Qty: 35 | Fill #0

## 2019-11-14 NOTE — Patient Instructions (Signed)
Description   Continue same dosage of 1 tablet daily except 1/2 tablet on Tuesdays and Saturdays. Recheck INR in 5 weeks. Main # [phone] [facility] [phone].

## 2019-11-15 ENCOUNTER — Other Ambulatory Visit: Payer: Self-pay | Admitting: Cardiovascular Disease

## 2019-11-15 DIAGNOSIS — I4891 Unspecified atrial fibrillation: Secondary | ICD-10-CM

## 2019-11-15 DIAGNOSIS — Z5181 Encounter for therapeutic drug level monitoring: Secondary | ICD-10-CM

## 2019-11-21 ENCOUNTER — Other Ambulatory Visit: Payer: Self-pay | Admitting: Internal Medicine

## 2019-11-21 ENCOUNTER — Other Ambulatory Visit (HOSPITAL_COMMUNITY): Payer: Self-pay | Admitting: Cardiology

## 2019-11-21 MED FILL — DIGOXIN 0.125 MG TABLET: 125 | 30 days supply | Qty: 30 | Fill #1

## 2019-11-22 DIAGNOSIS — H524 Presbyopia: Secondary | ICD-10-CM | POA: Diagnosis not present

## 2019-11-22 MED FILL — METOPROLOL SUCCINATE ER 100: 100 | 30 days supply | Qty: 60 | Fill #0

## 2019-11-22 MED FILL — FUROSEMIDE 80 MG TAB: 80 | 30 days supply | Qty: 60 | Fill #0

## 2019-11-27 DIAGNOSIS — H35371 Puckering of macula, right eye: Secondary | ICD-10-CM | POA: Diagnosis not present

## 2019-11-27 DIAGNOSIS — H34811 Central retinal vein occlusion, right eye, with macular edema: Secondary | ICD-10-CM | POA: Diagnosis not present

## 2019-11-27 DIAGNOSIS — H43821 Vitreomacular adhesion, right eye: Secondary | ICD-10-CM | POA: Diagnosis not present

## 2019-11-27 DIAGNOSIS — H2522 Age-related cataract, morgagnian type, left eye: Secondary | ICD-10-CM | POA: Diagnosis not present

## 2019-11-27 DIAGNOSIS — H3561 Retinal hemorrhage, right eye: Secondary | ICD-10-CM | POA: Diagnosis not present

## 2019-11-27 DIAGNOSIS — H35031 Hypertensive retinopathy, right eye: Secondary | ICD-10-CM | POA: Diagnosis not present

## 2019-12-04 DIAGNOSIS — H52203 Unspecified astigmatism, bilateral: Secondary | ICD-10-CM | POA: Diagnosis not present

## 2019-12-04 DIAGNOSIS — H2512 Age-related nuclear cataract, left eye: Secondary | ICD-10-CM | POA: Diagnosis not present

## 2019-12-04 DIAGNOSIS — H25041 Posterior subcapsular polar age-related cataract, right eye: Secondary | ICD-10-CM | POA: Diagnosis not present

## 2019-12-19 ENCOUNTER — Ambulatory Visit (INDEPENDENT_AMBULATORY_CARE_PROVIDER_SITE_OTHER): Payer: Medicare HMO | Admitting: *Deleted

## 2019-12-19 ENCOUNTER — Encounter (INDEPENDENT_AMBULATORY_CARE_PROVIDER_SITE_OTHER): Payer: Self-pay

## 2019-12-19 ENCOUNTER — Other Ambulatory Visit: Payer: Self-pay

## 2019-12-19 DIAGNOSIS — I4892 Unspecified atrial flutter: Secondary | ICD-10-CM | POA: Diagnosis not present

## 2019-12-19 DIAGNOSIS — I8290 Acute embolism and thrombosis of unspecified vein: Secondary | ICD-10-CM

## 2019-12-19 DIAGNOSIS — I4891 Unspecified atrial fibrillation: Secondary | ICD-10-CM

## 2019-12-19 DIAGNOSIS — Z5181 Encounter for therapeutic drug level monitoring: Secondary | ICD-10-CM | POA: Diagnosis not present

## 2019-12-19 LAB — POCT INR: INR: 3.2 — AB (ref 2.0–3.0)

## 2019-12-19 MED ORDER — WARFARIN SODIUM 6 MG PO TABS: ORAL_TABLET | ORAL | 0 refills | Status: DC

## 2019-12-19 MED FILL — WARFARIN SODIUM 6 MG TABLET: 6 | 30 days supply | Qty: 35 | Fill #0

## 2019-12-19 NOTE — Patient Instructions (Signed)
Description   Hold today and then, continue same dosage of 1 tablet daily except 1/2 tablet on Tuesdays and Saturdays. Recheck INR in 3 weeks. Main # [phone] [facility] [phone].

## 2019-12-22 ENCOUNTER — Other Ambulatory Visit: Payer: Self-pay | Admitting: Internal Medicine

## 2019-12-22 ENCOUNTER — Other Ambulatory Visit (HOSPITAL_COMMUNITY): Payer: Self-pay | Admitting: Cardiology

## 2019-12-22 MED FILL — METOPROLOL SUCCINATE ER 100: 100 | 30 days supply | Qty: 60 | Fill #0

## 2019-12-22 MED FILL — FUROSEMIDE 80 MG TAB: 80 | 30 days supply | Qty: 60 | Fill #0

## 2019-12-22 MED FILL — DIGOXIN 0.125 MG TABLET: 125 | 30 days supply | Qty: 30 | Fill #2

## 2019-12-25 DIAGNOSIS — H34811 Central retinal vein occlusion, right eye, with macular edema: Secondary | ICD-10-CM | POA: Diagnosis not present

## 2019-12-25 DIAGNOSIS — H35371 Puckering of macula, right eye: Secondary | ICD-10-CM | POA: Diagnosis not present

## 2019-12-25 DIAGNOSIS — H3561 Retinal hemorrhage, right eye: Secondary | ICD-10-CM | POA: Diagnosis not present

## 2019-12-25 DIAGNOSIS — H35031 Hypertensive retinopathy, right eye: Secondary | ICD-10-CM | POA: Diagnosis not present

## 2020-01-08 MED FILL — DIGOXIN 0.125 MG TABLET: 125 | 10 days supply | Qty: 10 | Fill #3

## 2020-01-10 ENCOUNTER — Ambulatory Visit (INDEPENDENT_AMBULATORY_CARE_PROVIDER_SITE_OTHER): Payer: Medicare HMO | Admitting: *Deleted

## 2020-01-10 ENCOUNTER — Other Ambulatory Visit: Payer: Self-pay

## 2020-01-10 DIAGNOSIS — I8290 Acute embolism and thrombosis of unspecified vein: Secondary | ICD-10-CM

## 2020-01-10 DIAGNOSIS — I513 Intracardiac thrombosis, not elsewhere classified: Secondary | ICD-10-CM | POA: Diagnosis not present

## 2020-01-10 DIAGNOSIS — I4891 Unspecified atrial fibrillation: Secondary | ICD-10-CM

## 2020-01-10 DIAGNOSIS — Z5181 Encounter for therapeutic drug level monitoring: Secondary | ICD-10-CM | POA: Diagnosis not present

## 2020-01-10 DIAGNOSIS — I4892 Unspecified atrial flutter: Secondary | ICD-10-CM | POA: Diagnosis not present

## 2020-01-10 LAB — POCT INR: INR: 2.9 (ref 2.0–3.0)

## 2020-01-10 NOTE — Patient Instructions (Signed)
Description   Continue same dosage of 1 tablet daily except 1/2 tablet on Tuesdays and Saturdays. Recheck INR in 4 weeks. Main # [phone] [facility] [phone].

## 2020-01-17 ENCOUNTER — Other Ambulatory Visit: Payer: Self-pay | Admitting: Cardiology

## 2020-01-17 ENCOUNTER — Other Ambulatory Visit (HOSPITAL_COMMUNITY): Payer: Self-pay | Admitting: Internal Medicine

## 2020-01-17 DIAGNOSIS — I4891 Unspecified atrial fibrillation: Secondary | ICD-10-CM

## 2020-01-17 DIAGNOSIS — Z5181 Encounter for therapeutic drug level monitoring: Secondary | ICD-10-CM

## 2020-01-17 MED FILL — METOPROLOL SUCCINATE ER 100: 100 | 30 days supply | Qty: 60 | Fill #0

## 2020-01-17 MED FILL — FUROSEMIDE 80 MG TAB: 80 | 30 days supply | Qty: 60 | Fill #0

## 2020-01-17 MED FILL — DIGOXIN 0.125 MG TABLET: 125 | 30 days supply | Qty: 30 | Fill #4

## 2020-01-17 MED FILL — WARFARIN SODIUM 6 MG TABLET: 6 | 30 days supply | Qty: 35 | Fill #0

## 2020-01-23 DIAGNOSIS — H34811 Central retinal vein occlusion, right eye, with macular edema: Secondary | ICD-10-CM | POA: Diagnosis not present

## 2020-02-07 ENCOUNTER — Ambulatory Visit (INDEPENDENT_AMBULATORY_CARE_PROVIDER_SITE_OTHER): Payer: Medicare HMO | Admitting: *Deleted

## 2020-02-07 ENCOUNTER — Other Ambulatory Visit: Payer: Self-pay

## 2020-02-07 DIAGNOSIS — I4892 Unspecified atrial flutter: Secondary | ICD-10-CM

## 2020-02-07 DIAGNOSIS — Z5181 Encounter for therapeutic drug level monitoring: Secondary | ICD-10-CM

## 2020-02-07 DIAGNOSIS — I8290 Acute embolism and thrombosis of unspecified vein: Secondary | ICD-10-CM | POA: Diagnosis not present

## 2020-02-07 DIAGNOSIS — I4891 Unspecified atrial fibrillation: Secondary | ICD-10-CM

## 2020-02-07 LAB — POCT INR: INR: 3.6 — AB (ref 2.0–3.0)

## 2020-02-07 NOTE — Patient Instructions (Signed)
Description   Hold today and then continue same dosage of 1 tablet daily except 1/2 tablet on Tuesdays and Saturdays. Recheck INR in 3 weeks. Main # [phone] [facility] [phone].

## 2020-02-20 ENCOUNTER — Other Ambulatory Visit (HOSPITAL_COMMUNITY): Payer: Self-pay

## 2020-02-20 DIAGNOSIS — H35371 Puckering of macula, right eye: Secondary | ICD-10-CM | POA: Diagnosis not present

## 2020-02-20 DIAGNOSIS — H34811 Central retinal vein occlusion, right eye, with macular edema: Secondary | ICD-10-CM | POA: Diagnosis not present

## 2020-02-20 MED ORDER — DIGOXIN 125 MCG PO TABS: 125.0000 ug | ORAL_TABLET | Freq: Every day | ORAL | 0 refills | Status: DC

## 2020-02-20 MED FILL — FUROSEMIDE 80 MG TAB: 80 | 30 days supply | Qty: 60 | Fill #1

## 2020-02-20 MED FILL — WARFARIN SODIUM 6 MG TABLET: 6 | 30 days supply | Qty: 35 | Fill #1

## 2020-02-20 MED FILL — METOPROLOL SUCCINATE ER 100: 100 | 30 days supply | Qty: 60 | Fill #1

## 2020-02-20 MED FILL — DIGOXIN 0.125 MG TABLET: 125 | 30 days supply | Qty: 30 | Fill #0

## 2020-03-06 ENCOUNTER — Inpatient Hospital Stay (HOSPITAL_COMMUNITY): Admission: RE | Admit: 2020-03-06 | Payer: Medicare HMO | Source: Ambulatory Visit | Admitting: Internal Medicine

## 2020-03-12 ENCOUNTER — Other Ambulatory Visit: Payer: Self-pay

## 2020-03-12 ENCOUNTER — Ambulatory Visit (INDEPENDENT_AMBULATORY_CARE_PROVIDER_SITE_OTHER): Payer: Medicare HMO | Admitting: *Deleted

## 2020-03-12 DIAGNOSIS — I8290 Acute embolism and thrombosis of unspecified vein: Secondary | ICD-10-CM | POA: Diagnosis not present

## 2020-03-12 DIAGNOSIS — I513 Intracardiac thrombosis, not elsewhere classified: Secondary | ICD-10-CM

## 2020-03-12 DIAGNOSIS — Z5181 Encounter for therapeutic drug level monitoring: Secondary | ICD-10-CM

## 2020-03-12 DIAGNOSIS — I4892 Unspecified atrial flutter: Secondary | ICD-10-CM | POA: Diagnosis not present

## 2020-03-12 DIAGNOSIS — I4891 Unspecified atrial fibrillation: Secondary | ICD-10-CM

## 2020-03-12 LAB — POCT INR: INR: 3 (ref 2.0–3.0)

## 2020-03-12 NOTE — Patient Instructions (Signed)
Description   Continue same dosage of 1 tablet daily except 1/2 tablet on Tuesdays and Saturdays. Recheck INR in 4 weeks. Main # [phone] [facility] [phone].

## 2020-03-19 ENCOUNTER — Other Ambulatory Visit (HOSPITAL_COMMUNITY): Payer: Self-pay | Admitting: Internal Medicine

## 2020-03-19 ENCOUNTER — Other Ambulatory Visit: Payer: Self-pay | Admitting: Cardiology

## 2020-03-19 DIAGNOSIS — H34811 Central retinal vein occlusion, right eye, with macular edema: Secondary | ICD-10-CM | POA: Diagnosis not present

## 2020-03-19 DIAGNOSIS — I4891 Unspecified atrial fibrillation: Secondary | ICD-10-CM

## 2020-03-19 DIAGNOSIS — H35371 Puckering of macula, right eye: Secondary | ICD-10-CM | POA: Diagnosis not present

## 2020-03-19 DIAGNOSIS — H3561 Retinal hemorrhage, right eye: Secondary | ICD-10-CM | POA: Diagnosis not present

## 2020-03-19 DIAGNOSIS — Z5181 Encounter for therapeutic drug level monitoring: Secondary | ICD-10-CM

## 2020-03-19 DIAGNOSIS — H35031 Hypertensive retinopathy, right eye: Secondary | ICD-10-CM | POA: Diagnosis not present

## 2020-04-16 DIAGNOSIS — H34811 Central retinal vein occlusion, right eye, with macular edema: Secondary | ICD-10-CM | POA: Diagnosis not present

## 2020-04-18 ENCOUNTER — Ambulatory Visit (INDEPENDENT_AMBULATORY_CARE_PROVIDER_SITE_OTHER): Payer: Medicare HMO | Admitting: *Deleted

## 2020-04-18 ENCOUNTER — Other Ambulatory Visit: Payer: Self-pay

## 2020-04-18 DIAGNOSIS — I4892 Unspecified atrial flutter: Secondary | ICD-10-CM

## 2020-04-18 DIAGNOSIS — Z5181 Encounter for therapeutic drug level monitoring: Secondary | ICD-10-CM

## 2020-04-18 DIAGNOSIS — I4891 Unspecified atrial fibrillation: Secondary | ICD-10-CM

## 2020-04-18 DIAGNOSIS — I8290 Acute embolism and thrombosis of unspecified vein: Secondary | ICD-10-CM

## 2020-04-18 DIAGNOSIS — I513 Intracardiac thrombosis, not elsewhere classified: Secondary | ICD-10-CM | POA: Diagnosis not present

## 2020-04-18 LAB — POCT INR: INR: 2.4 (ref 2.0–3.0)

## 2020-04-18 NOTE — Patient Instructions (Signed)
Description   Continue taking Warfarin 1 tablet daily except 1/2 tablet on Tuesdays and Saturdays. Recheck INR in 4 weeks. Main # [phone] [facility] [phone].     

## 2020-04-25 ENCOUNTER — Other Ambulatory Visit: Payer: Self-pay | Admitting: Cardiology

## 2020-04-25 DIAGNOSIS — Z5181 Encounter for therapeutic drug level monitoring: Secondary | ICD-10-CM

## 2020-04-25 DIAGNOSIS — I4891 Unspecified atrial fibrillation: Secondary | ICD-10-CM

## 2020-04-25 MED FILL — METOPROLOL SUCCINATE ER 100: 100 | 30 days supply | Qty: 60 | Fill #1

## 2020-04-25 MED FILL — WARFARIN SODIUM 6 MG TABLET: 6 | 30 days supply | Qty: 35 | Fill #0

## 2020-04-25 MED FILL — FUROSEMIDE 80 MG TAB: 80 | 30 days supply | Qty: 60 | Fill #1

## 2020-04-25 MED FILL — DIGOXIN 0.125 MG TABLET: 125 | 30 days supply | Qty: 30 | Fill #1

## 2020-05-10 ENCOUNTER — Inpatient Hospital Stay (HOSPITAL_COMMUNITY): Admission: RE | Admit: 2020-05-10 | Payer: Medicare HMO | Source: Ambulatory Visit | Admitting: Internal Medicine

## 2020-05-21 DIAGNOSIS — H34811 Central retinal vein occlusion, right eye, with macular edema: Secondary | ICD-10-CM | POA: Diagnosis not present

## 2020-05-24 ENCOUNTER — Other Ambulatory Visit: Payer: Self-pay | Admitting: Cardiology

## 2020-05-24 DIAGNOSIS — Z5181 Encounter for therapeutic drug level monitoring: Secondary | ICD-10-CM

## 2020-05-24 DIAGNOSIS — I4891 Unspecified atrial fibrillation: Secondary | ICD-10-CM

## 2020-05-24 MED FILL — DIGOXIN 0.125 MG TABLET: 125 | 30 days supply | Qty: 30 | Fill #2

## 2020-05-24 MED FILL — METOPROLOL SUCCINATE ER 100: 100 | 30 days supply | Qty: 60 | Fill #2

## 2020-05-24 MED FILL — FUROSEMIDE 80 MG TAB: 80 | 30 days supply | Qty: 60 | Fill #2

## 2020-05-24 NOTE — Telephone Encounter (Signed)
Called pt to reschedule INR check in order to refill Warfarin.  Left message to call back.

## 2020-05-29 NOTE — Telephone Encounter (Signed)
Called pt and he states he is not out of Warfarin and he is aware he is overdue for appt. He states he has been babysitting and had to cancel and now he is able to reschedule. Was able to reschedule the appt for the pt to Friday July 23rd at 315pm.

## 2020-05-31 ENCOUNTER — Ambulatory Visit (INDEPENDENT_AMBULATORY_CARE_PROVIDER_SITE_OTHER): Payer: Medicare HMO | Admitting: *Deleted

## 2020-05-31 ENCOUNTER — Other Ambulatory Visit: Payer: Self-pay

## 2020-05-31 DIAGNOSIS — I4891 Unspecified atrial fibrillation: Secondary | ICD-10-CM

## 2020-05-31 DIAGNOSIS — I4892 Unspecified atrial flutter: Secondary | ICD-10-CM | POA: Diagnosis not present

## 2020-05-31 DIAGNOSIS — Z5181 Encounter for therapeutic drug level monitoring: Secondary | ICD-10-CM

## 2020-05-31 DIAGNOSIS — I8290 Acute embolism and thrombosis of unspecified vein: Secondary | ICD-10-CM

## 2020-05-31 LAB — POCT INR: INR: 3.3 — AB (ref 2.0–3.0)

## 2020-05-31 MED FILL — WARFARIN SODIUM 6 MG TABLET: 6 | 30 days supply | Qty: 35 | Fill #0

## 2020-05-31 NOTE — Patient Instructions (Signed)
Description   Hold warfarin today, then continue taking Warfarin 1 tablet daily except 1/2 tablet on Tuesdays and Saturdays. Recheck INR in 3 weeks. Main # [phone] [facility] [phone].

## 2020-06-18 DIAGNOSIS — H35031 Hypertensive retinopathy, right eye: Secondary | ICD-10-CM | POA: Diagnosis not present

## 2020-06-18 DIAGNOSIS — H34811 Central retinal vein occlusion, right eye, with macular edema: Secondary | ICD-10-CM | POA: Diagnosis not present

## 2020-06-25 ENCOUNTER — Other Ambulatory Visit: Payer: Self-pay

## 2020-06-25 ENCOUNTER — Ambulatory Visit (INDEPENDENT_AMBULATORY_CARE_PROVIDER_SITE_OTHER): Payer: Medicare HMO | Admitting: *Deleted

## 2020-06-25 DIAGNOSIS — I4891 Unspecified atrial fibrillation: Secondary | ICD-10-CM

## 2020-06-25 DIAGNOSIS — I8290 Acute embolism and thrombosis of unspecified vein: Secondary | ICD-10-CM | POA: Diagnosis not present

## 2020-06-25 DIAGNOSIS — I513 Intracardiac thrombosis, not elsewhere classified: Secondary | ICD-10-CM | POA: Diagnosis not present

## 2020-06-25 DIAGNOSIS — Z5181 Encounter for therapeutic drug level monitoring: Secondary | ICD-10-CM

## 2020-06-25 DIAGNOSIS — I4892 Unspecified atrial flutter: Secondary | ICD-10-CM

## 2020-06-25 LAB — POCT INR: INR: 2 (ref 2.0–3.0)

## 2020-06-25 NOTE — Patient Instructions (Signed)
Description   Continue taking Warfarin 1 tablet daily except 1/2 tablet on Tuesdays and Saturdays. Recheck INR in 4 weeks. Main # [phone] [facility] [phone].

## 2020-06-27 ENCOUNTER — Other Ambulatory Visit: Payer: Self-pay | Admitting: Cardiology

## 2020-06-27 ENCOUNTER — Other Ambulatory Visit: Payer: Self-pay | Admitting: Internal Medicine

## 2020-06-27 DIAGNOSIS — Z5181 Encounter for therapeutic drug level monitoring: Secondary | ICD-10-CM

## 2020-06-27 DIAGNOSIS — I4891 Unspecified atrial fibrillation: Secondary | ICD-10-CM

## 2020-06-27 MED FILL — DIGOXIN 0.125 MG TABLET: 125 | 30 days supply | Qty: 30 | Fill #0

## 2020-06-27 MED FILL — FUROSEMIDE 80 MG TAB: 80 | 30 days supply | Qty: 60 | Fill #0

## 2020-06-27 MED FILL — METOPROLOL SUCCINATE ER 100: 100 | 30 days supply | Qty: 60 | Fill #0

## 2020-06-27 MED FILL — WARFARIN SODIUM 6 MG TABLET: 6 | 30 days supply | Qty: 35 | Fill #0

## 2020-07-08 DIAGNOSIS — H2523 Age-related cataract, morgagnian type, bilateral: Secondary | ICD-10-CM | POA: Diagnosis not present
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
## 2020-07-16 ENCOUNTER — Other Ambulatory Visit: Payer: Self-pay

## 2020-07-16 ENCOUNTER — Ambulatory Visit (HOSPITAL_COMMUNITY)
Admission: RE | Admit: 2020-07-16 | Discharge: 2020-07-16 | Disposition: A | Discharge status: Home / Self Care | Payer: Medicare HMO | Source: Ambulatory Visit | Attending: Internal Medicine | Admitting: Internal Medicine

## 2020-07-16 VITALS — BP 150/100 | HR 57 | Wt 203.6 lb

## 2020-07-16 DIAGNOSIS — I513 Intracardiac thrombosis, not elsewhere classified: Secondary | ICD-10-CM

## 2020-07-16 DIAGNOSIS — I130 Hypertensive heart and chronic kidney disease with heart failure and stage 1 through stage 4 chronic kidney disease, or unspecified chronic kidney disease: Secondary | ICD-10-CM | POA: Diagnosis not present

## 2020-07-16 DIAGNOSIS — F329 Major depressive disorder, single episode, unspecified: Secondary | ICD-10-CM | POA: Insufficient documentation

## 2020-07-16 DIAGNOSIS — I480 Paroxysmal atrial fibrillation: Secondary | ICD-10-CM

## 2020-07-16 DIAGNOSIS — E1122 Type 2 diabetes mellitus with diabetic chronic kidney disease: Secondary | ICD-10-CM | POA: Insufficient documentation

## 2020-07-16 DIAGNOSIS — I5022 Chronic systolic (congestive) heart failure: Secondary | ICD-10-CM | POA: Insufficient documentation

## 2020-07-16 DIAGNOSIS — Z79899 Other long term (current) drug therapy: Secondary | ICD-10-CM | POA: Insufficient documentation

## 2020-07-16 DIAGNOSIS — Z7901 Long term (current) use of anticoagulants: Secondary | ICD-10-CM | POA: Diagnosis not present

## 2020-07-16 DIAGNOSIS — I10 Essential (primary) hypertension: Secondary | ICD-10-CM

## 2020-07-16 DIAGNOSIS — N183 Chronic kidney disease, stage 3 unspecified: Secondary | ICD-10-CM | POA: Insufficient documentation

## 2020-07-16 DIAGNOSIS — Z9119 Patient's noncompliance with other medical treatment and regimen: Secondary | ICD-10-CM | POA: Diagnosis not present

## 2020-07-16 DIAGNOSIS — E669 Obesity, unspecified: Secondary | ICD-10-CM | POA: Insufficient documentation

## 2020-07-16 DIAGNOSIS — G4733 Obstructive sleep apnea (adult) (pediatric): Secondary | ICD-10-CM | POA: Insufficient documentation

## 2020-07-16 DIAGNOSIS — I4891 Unspecified atrial fibrillation: Secondary | ICD-10-CM | POA: Insufficient documentation

## 2020-07-16 LAB — COMPREHENSIVE METABOLIC PANEL
ALT: 25 U/L (ref 0–44)
AST: 18 U/L (ref 15–41)
Albumin: 3.9 g/dL (ref 3.5–5.0)
Alkaline Phosphatase: 29 U/L — ABNORMAL LOW (ref 38–126)
Anion gap: 11 (ref 5–15)
BUN: 20 mg/dL (ref 8–23)
CO2: 28 mmol/L (ref 22–32)
Calcium: 9.1 mg/dL (ref 8.9–10.3)
Chloride: 99 mmol/L (ref 98–111)
Creatinine, Ser: 1.25 mg/dL — ABNORMAL HIGH (ref 0.61–1.24)
GFR calc Af Amer: >60 mL/min (ref >60)
GFR calc non Af Amer: 58 mL/min — ABNORMAL LOW (ref >60)
Glucose, Bld: 236 mg/dL — ABNORMAL HIGH (ref 70–99)
Potassium: 3.9 mmol/L (ref 3.5–5.1)
Sodium: 138 mmol/L (ref 135–145)
Total Bilirubin: 1.1 mg/dL (ref 0.3–1.2)
Total Protein: 7.6 g/dL (ref 6.5–8.1)

## 2020-07-16 LAB — CBC
HCT: 39.4 % (ref 39.0–52.0)
Hemoglobin: 12.7 g/dL — ABNORMAL LOW (ref 13.0–17.0)
MCH: 28.6 pg (ref 26.0–34.0)
MCHC: 32.2 g/dL (ref 30.0–36.0)
MCV: 88.7 fL (ref 80.0–100.0)
Platelets: 157 10*3/uL (ref 150–400)
RBC: 4.44 MIL/uL (ref 4.22–5.81)
RDW: 13.5 % (ref 11.5–15.5)
WBC: 7.7 10*3/uL (ref 4.0–10.5)
nRBC: 0 % (ref 0.0–0.2)

## 2020-07-16 LAB — BRAIN NATRIURETIC PEPTIDE: B Natriuretic Peptide: 184.8 pg/mL — ABNORMAL HIGH (ref 0.0–100.0)

## 2020-07-16 MED ORDER — LOSARTAN POTASSIUM 50 MG PO TABS: 50.0000 mg | ORAL_TABLET | Freq: Every day | ORAL | 6 refills | Status: DC

## 2020-07-16 MED FILL — LOSARTAN POTASSIUM 50 MG TA: 50 | 30 days supply | Qty: 30 | Fill #0

## 2020-07-16 NOTE — Addendum Note (Signed)
Encounter addended by: Samara Snide, RN on: 07/16/2020 10:47 AM  Actions taken: Medication long-term status modified, Diagnosis association updated, Order list changed, Charge Capture section accepted, Clinical Note Signed

## 2020-07-16 NOTE — Patient Instructions (Signed)
START Losartan 50mg  Daily  STOP Digoxin  Please follow up with our Pharmacy in 3-4 weeks and follow up with our clinic in 6 months  Your physician has requested that you have an echocardiogram. Echocardiography is a painless test that uses sound waves to create images of your heart. It provides your doctor with information about the size and shape of your heart and how well your hearts chambers and valves are working. This procedure takes approximately one hour. There are no restrictions for this procedure.  Labs done today, your results will be available in MyChart, we will contact you for abnormal readings.  If you have any questions or concerns before your next appointment please send a message through [place] or call our office at [phone].    TO LEAVE A MESSAGE FOR THE NURSE SELECT OPTION 2, PLEASE LEAVE A MESSAGE INCLUDING:  YOUR NAME  DATE OF BIRTH  CALL BACK NUMBER  REASON FOR CALL**this is important as we prioritize the call backs  YOU WILL RECEIVE A CALL BACK THE SAME DAY AS LONG AS YOU CALL BEFORE 4:00 PM  At the Advanced [facility] Failure Clinic, you and your health needs are our priority. As part of our continuing mission to provide you with exceptional heart care, we have created designated Provider Care Teams. These Care Teams include your primary Cardiologist (physician) and Advanced Practice Providers (APPs- Physician Assistants and Nurse Practitioners) who all work together to provide you with the care you need, when you need it.   You may see any of the following providers on your designated Care Team at your next follow up:  Dr [phone]  Dr Arvilla Meres, NP  Carron Curie, Robbie Lis  [place], PharmD   Please be sure to bring in all your medications bottles to every appointment.

## 2020-07-16 NOTE — Addendum Note (Signed)
Encounter addended by: Noralee Space, RN on: 07/16/2020 10:48 AM  Actions taken: Order list changed, Diagnosis association updated

## 2020-07-22 DIAGNOSIS — H3561 Retinal hemorrhage, right eye: Secondary | ICD-10-CM | POA: Diagnosis not present

## 2020-07-22 DIAGNOSIS — H35031 Hypertensive retinopathy, right eye: Secondary | ICD-10-CM | POA: Diagnosis not present

## 2020-07-22 DIAGNOSIS — H34811 Central retinal vein occlusion, right eye, with macular edema: Secondary | ICD-10-CM | POA: Diagnosis not present

## 2020-07-22 DIAGNOSIS — H35371 Puckering of macula, right eye: Secondary | ICD-10-CM | POA: Diagnosis not present

## 2020-07-25 MED FILL — LOSARTAN POTASSIUM 50 MG TA: 50 | 30 days supply | Qty: 30 | Fill #0
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
## 2020-07-29 ENCOUNTER — Telehealth (HOSPITAL_COMMUNITY): Payer: Self-pay | Admitting: *Deleted

## 2020-07-29 NOTE — Telephone Encounter (Signed)
Received fax from [facility], pt needs clearance to have cataract surgery with a local eye block with moderate sedation  Per Dr Gala Romney: ok to proceed  Note faxed back to them at [phone]

## 2020-07-31 ENCOUNTER — Ambulatory Visit (INDEPENDENT_AMBULATORY_CARE_PROVIDER_SITE_OTHER): Payer: Medicare HMO | Admitting: *Deleted

## 2020-07-31 ENCOUNTER — Other Ambulatory Visit: Payer: Self-pay

## 2020-07-31 DIAGNOSIS — Z5181 Encounter for therapeutic drug level monitoring: Secondary | ICD-10-CM

## 2020-07-31 DIAGNOSIS — I4891 Unspecified atrial fibrillation: Secondary | ICD-10-CM

## 2020-07-31 DIAGNOSIS — I4892 Unspecified atrial flutter: Secondary | ICD-10-CM

## 2020-07-31 DIAGNOSIS — I8290 Acute embolism and thrombosis of unspecified vein: Secondary | ICD-10-CM | POA: Diagnosis not present

## 2020-07-31 LAB — POCT INR: INR: 4.6 — AB (ref 2.0–3.0)

## 2020-07-31 NOTE — Patient Instructions (Signed)
Description   Hold warfarin today and tomorrow, then continue taking Warfarin 1 tablet daily except 1/2 tablet on Tuesdays and Saturdays. Recheck INR in 2 weeks. Main # [phone] [facility] [phone].

## 2020-08-05 ENCOUNTER — Other Ambulatory Visit: Payer: Self-pay | Admitting: Internal Medicine

## 2020-08-05 MED FILL — WARFARIN SODIUM 6 MG TABLET: 6 | 30 days supply | Qty: 35 | Fill #1

## 2020-08-06 ENCOUNTER — Other Ambulatory Visit: Payer: Self-pay

## 2020-08-06 ENCOUNTER — Ambulatory Visit (HOSPITAL_COMMUNITY)
Admission: RE | Admit: 2020-08-06 | Discharge: 2020-08-06 | Disposition: A | Discharge status: Home / Self Care | Payer: Medicare HMO | Source: Ambulatory Visit | Attending: Cardiology | Admitting: Cardiology

## 2020-08-06 VITALS — BP 162/70 | HR 53 | Wt 204.0 lb

## 2020-08-06 DIAGNOSIS — I5022 Chronic systolic (congestive) heart failure: Secondary | ICD-10-CM | POA: Diagnosis not present

## 2020-08-06 DIAGNOSIS — E669 Obesity, unspecified: Secondary | ICD-10-CM | POA: Diagnosis not present

## 2020-08-06 DIAGNOSIS — E1122 Type 2 diabetes mellitus with diabetic chronic kidney disease: Secondary | ICD-10-CM | POA: Insufficient documentation

## 2020-08-06 DIAGNOSIS — Z79899 Other long term (current) drug therapy: Secondary | ICD-10-CM | POA: Insufficient documentation

## 2020-08-06 DIAGNOSIS — F333 Major depressive disorder, recurrent, severe with psychotic symptoms: Secondary | ICD-10-CM | POA: Diagnosis not present

## 2020-08-06 DIAGNOSIS — I130 Hypertensive heart and chronic kidney disease with heart failure and stage 1 through stage 4 chronic kidney disease, or unspecified chronic kidney disease: Secondary | ICD-10-CM | POA: Insufficient documentation

## 2020-08-06 DIAGNOSIS — Z9119 Patient's noncompliance with other medical treatment and regimen: Secondary | ICD-10-CM | POA: Diagnosis not present

## 2020-08-06 DIAGNOSIS — Z86718 Personal history of other venous thrombosis and embolism: Secondary | ICD-10-CM | POA: Diagnosis not present

## 2020-08-06 DIAGNOSIS — N183 Chronic kidney disease, stage 3 unspecified: Secondary | ICD-10-CM | POA: Diagnosis not present

## 2020-08-06 DIAGNOSIS — Z9114 Patient's other noncompliance with medication regimen: Secondary | ICD-10-CM | POA: Diagnosis not present

## 2020-08-06 DIAGNOSIS — I480 Paroxysmal atrial fibrillation: Secondary | ICD-10-CM | POA: Diagnosis not present

## 2020-08-06 DIAGNOSIS — Z7901 Long term (current) use of anticoagulants: Secondary | ICD-10-CM | POA: Diagnosis not present

## 2020-08-06 LAB — BASIC METABOLIC PANEL
Anion gap: 10 (ref 5–15)
BUN: 21 mg/dL (ref 8–23)
CO2: 30 mmol/L (ref 22–32)
Calcium: 9.3 mg/dL (ref 8.9–10.3)
Chloride: 98 mmol/L (ref 98–111)
Creatinine, Ser: 1.3 mg/dL — ABNORMAL HIGH (ref 0.61–1.24)
GFR calc Af Amer: >60 mL/min (ref >60)
GFR calc non Af Amer: 56 mL/min — ABNORMAL LOW (ref >60)
Glucose, Bld: 193 mg/dL — ABNORMAL HIGH (ref 70–99)
Potassium: 3.8 mmol/L (ref 3.5–5.1)
Sodium: 138 mmol/L (ref 135–145)

## 2020-08-06 MED FILL — METOPROLOL SUCCINATE ER 100: 100 | 30 days supply | Qty: 60 | Fill #0

## 2020-08-06 MED FILL — FUROSEMIDE 80 MG TAB: 80 | 30 days supply | Qty: 60 | Fill #0

## 2020-08-06 NOTE — Patient Instructions (Addendum)
It was a pleasure seeing you today!  MEDICATIONS: - Start losartan 50 mg (1 tablet) daily -Call if you have questions about your medications.  LABS: -We will call you if your labs need attention.  NEXT APPOINTMENT: Return to clinic in 3 weeks with Pharmacy Clinic.  In general, to take care of your heart failure: -Limit your fluid intake to 2 Liters (half-gallon) per day.   -Limit your salt intake to ideally 2-3 grams (2000-3000 mg) per day. -Weigh yourself daily and record, and bring that "weight diary" to your next appointment.  (Weight gain of 2-3 pounds in 1 day typically means fluid weight.) -The medications for your heart are to help your heart and help you live longer.   -Please contact us before stopping any of your heart medications.  Call the clinic at [phone] with questions or to reschedule future appointments.

## 2020-08-13 ENCOUNTER — Ambulatory Visit (HOSPITAL_COMMUNITY)
Admission: RE | Admit: 2020-08-13 | Discharge: 2020-08-13 | Disposition: A | Discharge status: Home / Self Care | Payer: Medicare HMO | Source: Ambulatory Visit | Attending: Internal Medicine | Admitting: Internal Medicine

## 2020-08-13 ENCOUNTER — Other Ambulatory Visit: Payer: Self-pay

## 2020-08-13 DIAGNOSIS — I5022 Chronic systolic (congestive) heart failure: Secondary | ICD-10-CM | POA: Diagnosis not present

## 2020-08-13 DIAGNOSIS — E669 Obesity, unspecified: Secondary | ICD-10-CM | POA: Diagnosis not present

## 2020-08-13 DIAGNOSIS — E1122 Type 2 diabetes mellitus with diabetic chronic kidney disease: Secondary | ICD-10-CM | POA: Diagnosis not present

## 2020-08-13 DIAGNOSIS — I4891 Unspecified atrial fibrillation: Secondary | ICD-10-CM | POA: Insufficient documentation

## 2020-08-13 DIAGNOSIS — N189 Chronic kidney disease, unspecified: Secondary | ICD-10-CM | POA: Insufficient documentation

## 2020-08-13 LAB — ECHOCARDIOGRAM COMPLETE
Area-P 1/2: 3.91 cm2
S' Lateral: 4.3 cm

## 2020-08-13 NOTE — Progress Notes (Signed)
  Echocardiogram 2D Echocardiogram has been performed.  [name] 08/13/2020, 3:05 PM

## 2020-08-14 ENCOUNTER — Other Ambulatory Visit (HOSPITAL_COMMUNITY): Payer: Self-pay

## 2020-08-14 DIAGNOSIS — Z5181 Encounter for therapeutic drug level monitoring: Secondary | ICD-10-CM

## 2020-08-14 DIAGNOSIS — I4891 Unspecified atrial fibrillation: Secondary | ICD-10-CM

## 2020-08-14 MED ORDER — FUROSEMIDE 80 MG PO TABS
80.0000 mg | ORAL_TABLET | Freq: Two times a day (BID) | ORAL | 3 refills | Status: DC
Start: 2020-08-14 — End: 2021-05-29

## 2020-08-14 MED ORDER — LOSARTAN POTASSIUM 50 MG PO TABS: 50.0000 mg | ORAL_TABLET | Freq: Every day | ORAL | 6 refills | Status: DC

## 2020-08-14 MED ORDER — METOPROLOL SUCCINATE ER 100 MG PO TB24: 100.0000 mg | ORAL_TABLET | Freq: Two times a day (BID) | ORAL | 3 refills | Status: DC

## 2020-08-19 DIAGNOSIS — H34811 Central retinal vein occlusion, right eye, with macular edema: Secondary | ICD-10-CM | POA: Diagnosis not present

## 2020-08-20 ENCOUNTER — Ambulatory Visit (INDEPENDENT_AMBULATORY_CARE_PROVIDER_SITE_OTHER): Payer: Medicare HMO | Admitting: *Deleted

## 2020-08-20 ENCOUNTER — Other Ambulatory Visit: Payer: Self-pay

## 2020-08-20 DIAGNOSIS — Z5181 Encounter for therapeutic drug level monitoring: Secondary | ICD-10-CM | POA: Diagnosis not present

## 2020-08-20 DIAGNOSIS — I8290 Acute embolism and thrombosis of unspecified vein: Secondary | ICD-10-CM | POA: Diagnosis not present

## 2020-08-20 DIAGNOSIS — I4892 Unspecified atrial flutter: Secondary | ICD-10-CM | POA: Diagnosis not present

## 2020-08-20 DIAGNOSIS — I4891 Unspecified atrial fibrillation: Secondary | ICD-10-CM | POA: Diagnosis not present

## 2020-08-20 LAB — POCT INR: INR: 2.4 (ref 2.0–3.0)

## 2020-08-20 NOTE — Patient Instructions (Signed)
Description   Continue taking Warfarin 1 tablet daily except 1/2 tablet on Tuesdays and Saturdays. Recheck INR in 3 weeks. Main # [phone] [facility] [phone].

## 2020-09-03 ENCOUNTER — Inpatient Hospital Stay (HOSPITAL_COMMUNITY)
Admission: RE | Admit: 2020-09-03 | Discharge: 2020-09-03 | Disposition: A | Discharge status: Home / Self Care | Payer: Medicare HMO | Source: Ambulatory Visit

## 2020-09-03 ENCOUNTER — Inpatient Hospital Stay (HOSPITAL_COMMUNITY): Admission: RE | Admit: 2020-09-03 | Payer: Medicare HMO | Source: Ambulatory Visit

## 2020-09-10 ENCOUNTER — Ambulatory Visit (INDEPENDENT_AMBULATORY_CARE_PROVIDER_SITE_OTHER): Payer: Medicare HMO | Admitting: *Deleted

## 2020-09-10 ENCOUNTER — Other Ambulatory Visit: Payer: Self-pay

## 2020-09-10 DIAGNOSIS — I4891 Unspecified atrial fibrillation: Secondary | ICD-10-CM | POA: Diagnosis not present

## 2020-09-10 DIAGNOSIS — I8290 Acute embolism and thrombosis of unspecified vein: Secondary | ICD-10-CM

## 2020-09-10 DIAGNOSIS — I4892 Unspecified atrial flutter: Secondary | ICD-10-CM | POA: Diagnosis not present

## 2020-09-10 DIAGNOSIS — Z5181 Encounter for therapeutic drug level monitoring: Secondary | ICD-10-CM

## 2020-09-10 DIAGNOSIS — I513 Intracardiac thrombosis, not elsewhere classified: Secondary | ICD-10-CM

## 2020-09-10 LAB — POCT INR: INR: 2.8 (ref 2.0–3.0)

## 2020-09-10 NOTE — Patient Instructions (Signed)
Description   Continue taking Warfarin 1 tablet daily except 1/2 tablet on Tuesdays and Saturdays. Recheck INR in 4 weeks. Main # [phone] [facility] [phone].

## 2020-09-12 ENCOUNTER — Other Ambulatory Visit: Payer: Self-pay | Admitting: Cardiology

## 2020-09-12 DIAGNOSIS — I4891 Unspecified atrial fibrillation: Secondary | ICD-10-CM

## 2020-09-12 DIAGNOSIS — Z5181 Encounter for therapeutic drug level monitoring: Secondary | ICD-10-CM

## 2020-09-13 ENCOUNTER — Other Ambulatory Visit (HOSPITAL_COMMUNITY): Payer: Self-pay

## 2020-09-13 ENCOUNTER — Other Ambulatory Visit (HOSPITAL_COMMUNITY): Payer: Self-pay | Admitting: Internal Medicine

## 2020-09-13 DIAGNOSIS — I4891 Unspecified atrial fibrillation: Secondary | ICD-10-CM

## 2020-09-13 DIAGNOSIS — Z5181 Encounter for therapeutic drug level monitoring: Secondary | ICD-10-CM

## 2020-09-13 MED ORDER — WARFARIN SODIUM 6 MG PO TABS: ORAL_TABLET | ORAL | 1 refills | Status: DC

## 2020-09-13 MED FILL — WARFARIN SODIUM 6 MG TABLET: 6 | 30 days supply | Qty: 35 | Fill #0

## 2020-09-25 ENCOUNTER — Inpatient Hospital Stay (HOSPITAL_COMMUNITY): Admission: RE | Admit: 2020-09-25 | Payer: Medicare HMO | Source: Ambulatory Visit

## 2020-09-25 ENCOUNTER — Other Ambulatory Visit (HOSPITAL_COMMUNITY): Payer: Medicare HMO

## 2020-10-01 DIAGNOSIS — H34811 Central retinal vein occlusion, right eye, with macular edema: Secondary | ICD-10-CM | POA: Diagnosis not present

## 2020-10-08 ENCOUNTER — Other Ambulatory Visit: Payer: Self-pay

## 2020-10-08 ENCOUNTER — Ambulatory Visit (INDEPENDENT_AMBULATORY_CARE_PROVIDER_SITE_OTHER): Payer: Medicare HMO | Admitting: Pharmacist

## 2020-10-08 DIAGNOSIS — I513 Intracardiac thrombosis, not elsewhere classified: Secondary | ICD-10-CM | POA: Diagnosis not present

## 2020-10-08 DIAGNOSIS — I4892 Unspecified atrial flutter: Secondary | ICD-10-CM | POA: Diagnosis not present

## 2020-10-08 DIAGNOSIS — I8290 Acute embolism and thrombosis of unspecified vein: Secondary | ICD-10-CM

## 2020-10-08 DIAGNOSIS — Z5181 Encounter for therapeutic drug level monitoring: Secondary | ICD-10-CM | POA: Diagnosis not present

## 2020-10-08 DIAGNOSIS — I4891 Unspecified atrial fibrillation: Secondary | ICD-10-CM

## 2020-10-08 LAB — POCT INR: INR: 2.8 (ref 2.0–3.0)

## 2020-10-08 MED ORDER — WARFARIN SODIUM 6 MG PO TABS: ORAL_TABLET | ORAL | 0 refills | Status: DC

## 2020-10-08 NOTE — Patient Instructions (Signed)
Continue taking Warfarin 1 tablet daily except 1/2 tablet on Tuesdays and Saturdays. Recheck INR in 5 weeks. Main # [phone] [facility] [phone].

## 2020-11-12 ENCOUNTER — Ambulatory Visit (INDEPENDENT_AMBULATORY_CARE_PROVIDER_SITE_OTHER): Payer: Medicare HMO | Admitting: *Deleted

## 2020-11-12 ENCOUNTER — Other Ambulatory Visit: Payer: Self-pay

## 2020-11-12 DIAGNOSIS — H35031 Hypertensive retinopathy, right eye: Secondary | ICD-10-CM | POA: Diagnosis not present

## 2020-11-12 DIAGNOSIS — Z5181 Encounter for therapeutic drug level monitoring: Secondary | ICD-10-CM | POA: Diagnosis not present

## 2020-11-12 DIAGNOSIS — I4891 Unspecified atrial fibrillation: Secondary | ICD-10-CM | POA: Diagnosis not present

## 2020-11-12 DIAGNOSIS — H35371 Puckering of macula, right eye: Secondary | ICD-10-CM | POA: Diagnosis not present

## 2020-11-12 DIAGNOSIS — I8290 Acute embolism and thrombosis of unspecified vein: Secondary | ICD-10-CM | POA: Diagnosis not present

## 2020-11-12 DIAGNOSIS — I4892 Unspecified atrial flutter: Secondary | ICD-10-CM | POA: Diagnosis not present

## 2020-11-12 DIAGNOSIS — I513 Intracardiac thrombosis, not elsewhere classified: Secondary | ICD-10-CM

## 2020-11-12 DIAGNOSIS — H34811 Central retinal vein occlusion, right eye, with macular edema: Secondary | ICD-10-CM | POA: Diagnosis not present

## 2020-11-12 DIAGNOSIS — H2513 Age-related nuclear cataract, bilateral: Secondary | ICD-10-CM | POA: Diagnosis not present

## 2020-11-12 LAB — POCT INR: INR: 2.5 (ref 2.0–3.0)

## 2020-11-12 NOTE — Patient Instructions (Signed)
Description   Continue taking Warfarin 1 tablet daily except 1/2 tablet on Tuesdays and Saturdays. Recheck INR in 6 weeks. Main # [phone] [facility] [phone].

## 2020-12-09 ENCOUNTER — Other Ambulatory Visit: Payer: Self-pay | Admitting: Cardiology

## 2020-12-09 DIAGNOSIS — Z5181 Encounter for therapeutic drug level monitoring: Secondary | ICD-10-CM

## 2020-12-09 DIAGNOSIS — I4891 Unspecified atrial fibrillation: Secondary | ICD-10-CM

## 2020-12-10 DIAGNOSIS — H34811 Central retinal vein occlusion, right eye, with macular edema: Secondary | ICD-10-CM | POA: Diagnosis not present

## 2020-12-27 ENCOUNTER — Ambulatory Visit (INDEPENDENT_AMBULATORY_CARE_PROVIDER_SITE_OTHER): Payer: Medicare HMO

## 2020-12-27 ENCOUNTER — Other Ambulatory Visit: Payer: Self-pay

## 2020-12-27 DIAGNOSIS — I4892 Unspecified atrial flutter: Secondary | ICD-10-CM

## 2020-12-27 DIAGNOSIS — I4891 Unspecified atrial fibrillation: Secondary | ICD-10-CM

## 2020-12-27 DIAGNOSIS — I513 Intracardiac thrombosis, not elsewhere classified: Secondary | ICD-10-CM

## 2020-12-27 DIAGNOSIS — I8290 Acute embolism and thrombosis of unspecified vein: Secondary | ICD-10-CM | POA: Diagnosis not present

## 2020-12-27 DIAGNOSIS — Z5181 Encounter for therapeutic drug level monitoring: Secondary | ICD-10-CM

## 2020-12-27 LAB — POCT INR: INR: 2.5 (ref 2.0–3.0)

## 2020-12-27 NOTE — Patient Instructions (Signed)
Description   Continue taking Warfarin 1 tablet daily except 1/2 tablet on Tuesdays and Saturdays. Recheck INR in 8 weeks. Main # [phone] [facility] [phone].

## 2021-01-07 DIAGNOSIS — H34811 Central retinal vein occlusion, right eye, with macular edema: Secondary | ICD-10-CM | POA: Diagnosis not present

## 2021-02-11 DIAGNOSIS — H35031 Hypertensive retinopathy, right eye: Secondary | ICD-10-CM | POA: Diagnosis not present

## 2021-02-11 DIAGNOSIS — H2513 Age-related nuclear cataract, bilateral: Secondary | ICD-10-CM | POA: Diagnosis not present

## 2021-02-11 DIAGNOSIS — H35371 Puckering of macula, right eye: Secondary | ICD-10-CM | POA: Diagnosis not present

## 2021-02-11 DIAGNOSIS — H34811 Central retinal vein occlusion, right eye, with macular edema: Secondary | ICD-10-CM | POA: Diagnosis not present

## 2021-02-21 ENCOUNTER — Other Ambulatory Visit: Payer: Self-pay

## 2021-02-21 ENCOUNTER — Ambulatory Visit (INDEPENDENT_AMBULATORY_CARE_PROVIDER_SITE_OTHER): Payer: Medicare HMO

## 2021-02-21 DIAGNOSIS — I8290 Acute embolism and thrombosis of unspecified vein: Secondary | ICD-10-CM | POA: Diagnosis not present

## 2021-02-21 DIAGNOSIS — Z5181 Encounter for therapeutic drug level monitoring: Secondary | ICD-10-CM

## 2021-02-21 DIAGNOSIS — I4892 Unspecified atrial flutter: Secondary | ICD-10-CM | POA: Diagnosis not present

## 2021-02-21 DIAGNOSIS — I4891 Unspecified atrial fibrillation: Secondary | ICD-10-CM

## 2021-02-21 DIAGNOSIS — I513 Intracardiac thrombosis, not elsewhere classified: Secondary | ICD-10-CM

## 2021-02-21 LAB — POCT INR: INR: 2.8 (ref 2.0–3.0)

## 2021-02-21 MED ORDER — WARFARIN SODIUM 6 MG PO TABS: ORAL_TABLET | ORAL | 1 refills | Status: DC

## 2021-02-21 NOTE — Patient Instructions (Signed)
Description   Continue taking Warfarin 1 tablet daily except 1/2 tablet on Tuesdays and Saturdays. Recheck INR in 8 weeks. Main # [phone] [facility] [phone].

## 2021-04-14 DIAGNOSIS — H34811 Central retinal vein occlusion, right eye, with macular edema: Secondary | ICD-10-CM | POA: Diagnosis not present

## 2021-04-14 DIAGNOSIS — H35031 Hypertensive retinopathy, right eye: Secondary | ICD-10-CM | POA: Diagnosis not present

## 2021-04-14 DIAGNOSIS — E119 Type 2 diabetes mellitus without complications: Secondary | ICD-10-CM | POA: Diagnosis not present

## 2021-04-14 DIAGNOSIS — H2513 Age-related nuclear cataract, bilateral: Secondary | ICD-10-CM | POA: Diagnosis not present

## 2021-04-18 ENCOUNTER — Other Ambulatory Visit: Payer: Self-pay

## 2021-04-18 ENCOUNTER — Ambulatory Visit (INDEPENDENT_AMBULATORY_CARE_PROVIDER_SITE_OTHER): Payer: Medicare HMO | Admitting: *Deleted

## 2021-04-18 DIAGNOSIS — I8290 Acute embolism and thrombosis of unspecified vein: Secondary | ICD-10-CM

## 2021-04-18 DIAGNOSIS — I4891 Unspecified atrial fibrillation: Secondary | ICD-10-CM

## 2021-04-18 DIAGNOSIS — Z5181 Encounter for therapeutic drug level monitoring: Secondary | ICD-10-CM | POA: Diagnosis not present

## 2021-04-18 DIAGNOSIS — I513 Intracardiac thrombosis, not elsewhere classified: Secondary | ICD-10-CM

## 2021-04-18 DIAGNOSIS — I4892 Unspecified atrial flutter: Secondary | ICD-10-CM | POA: Diagnosis not present

## 2021-04-18 LAB — POCT INR: INR: 4.4 — AB (ref 2.0–3.0)

## 2021-04-18 NOTE — Patient Instructions (Addendum)
Description   Do not take any Warfarin tomorrow and do not take any Warfarin Saturday then continue taking Warfarin 1 tablet daily except 1/2 tablet on Tuesdays and Saturdays. Recheck INR in 3 weeks (normally 8 weeks). Main # [phone] [facility] [phone].

## 2021-04-24 DIAGNOSIS — X58XXXA Exposure to other specified factors, initial encounter: Secondary | ICD-10-CM | POA: Diagnosis not present

## 2021-04-24 DIAGNOSIS — B49 Unspecified mycosis: Secondary | ICD-10-CM | POA: Diagnosis not present

## 2021-04-24 DIAGNOSIS — S91105A Unspecified open wound of left lesser toe(s) without damage to nail, initial encounter: Secondary | ICD-10-CM | POA: Diagnosis not present

## 2021-05-15 ENCOUNTER — Emergency Department (HOSPITAL_COMMUNITY): Payer: Medicare HMO

## 2021-05-15 ENCOUNTER — Inpatient Hospital Stay (HOSPITAL_COMMUNITY)
Admission: EM | Admit: 2021-05-15 | Discharge: 2021-05-29 | DRG: 253 | Disposition: A | Discharge status: Skilled Nursing Facility | Payer: Medicare HMO | Attending: Internal Medicine | Admitting: Internal Medicine

## 2021-05-15 ENCOUNTER — Ambulatory Visit (HOSPITAL_COMMUNITY)
Admission: EM | Admit: 2021-05-15 | Discharge: 2021-05-15 | Disposition: A | Discharge status: Home / Self Care | Payer: Medicare HMO

## 2021-05-15 ENCOUNTER — Encounter (HOSPITAL_COMMUNITY): Payer: Self-pay

## 2021-05-15 ENCOUNTER — Encounter (HOSPITAL_COMMUNITY): Payer: Self-pay | Admitting: Emergency Medicine

## 2021-05-15 ENCOUNTER — Other Ambulatory Visit: Payer: Self-pay

## 2021-05-15 DIAGNOSIS — E669 Obesity, unspecified: Secondary | ICD-10-CM | POA: Diagnosis present

## 2021-05-15 DIAGNOSIS — Z833 Family history of diabetes mellitus: Secondary | ICD-10-CM

## 2021-05-15 DIAGNOSIS — I480 Paroxysmal atrial fibrillation: Secondary | ICD-10-CM | POA: Diagnosis present

## 2021-05-15 DIAGNOSIS — I130 Hypertensive heart and chronic kidney disease with heart failure and stage 1 through stage 4 chronic kidney disease, or unspecified chronic kidney disease: Secondary | ICD-10-CM | POA: Diagnosis present

## 2021-05-15 DIAGNOSIS — I96 Gangrene, not elsewhere classified: Secondary | ICD-10-CM

## 2021-05-15 DIAGNOSIS — Z5181 Encounter for therapeutic drug level monitoring: Secondary | ICD-10-CM

## 2021-05-15 DIAGNOSIS — H538 Other visual disturbances: Secondary | ICD-10-CM | POA: Diagnosis present

## 2021-05-15 DIAGNOSIS — I509 Heart failure, unspecified: Secondary | ICD-10-CM

## 2021-05-15 DIAGNOSIS — Z7901 Long term (current) use of anticoagulants: Secondary | ICD-10-CM

## 2021-05-15 DIAGNOSIS — R41841 Cognitive communication deficit: Secondary | ICD-10-CM | POA: Diagnosis not present

## 2021-05-15 DIAGNOSIS — I5022 Chronic systolic (congestive) heart failure: Secondary | ICD-10-CM | POA: Diagnosis not present

## 2021-05-15 DIAGNOSIS — L089 Local infection of the skin and subcutaneous tissue, unspecified: Secondary | ICD-10-CM | POA: Diagnosis present

## 2021-05-15 DIAGNOSIS — Z20822 Contact with and (suspected) exposure to covid-19: Secondary | ICD-10-CM | POA: Diagnosis not present

## 2021-05-15 DIAGNOSIS — I70222 Atherosclerosis of native arteries of extremities with rest pain, left leg: Secondary | ICD-10-CM | POA: Diagnosis not present

## 2021-05-15 DIAGNOSIS — IMO0002 Reserved for concepts with insufficient information to code with codable children: Secondary | ICD-10-CM

## 2021-05-15 DIAGNOSIS — E1152 Type 2 diabetes mellitus with diabetic peripheral angiopathy with gangrene: Secondary | ICD-10-CM | POA: Diagnosis not present

## 2021-05-15 DIAGNOSIS — Z720 Tobacco use: Secondary | ICD-10-CM | POA: Diagnosis not present

## 2021-05-15 DIAGNOSIS — G8929 Other chronic pain: Secondary | ICD-10-CM | POA: Diagnosis present

## 2021-05-15 DIAGNOSIS — D696 Thrombocytopenia, unspecified: Secondary | ICD-10-CM | POA: Diagnosis not present

## 2021-05-15 DIAGNOSIS — R279 Unspecified lack of coordination: Secondary | ICD-10-CM | POA: Diagnosis not present

## 2021-05-15 DIAGNOSIS — M25561 Pain in right knee: Secondary | ICD-10-CM | POA: Diagnosis present

## 2021-05-15 DIAGNOSIS — Z6830 Body mass index (BMI) 30.0-30.9, adult: Secondary | ICD-10-CM | POA: Diagnosis not present

## 2021-05-15 DIAGNOSIS — F32A Depression, unspecified: Secondary | ICD-10-CM | POA: Diagnosis present

## 2021-05-15 DIAGNOSIS — J069 Acute upper respiratory infection, unspecified: Secondary | ICD-10-CM | POA: Diagnosis not present

## 2021-05-15 DIAGNOSIS — E11628 Type 2 diabetes mellitus with other skin complications: Secondary | ICD-10-CM

## 2021-05-15 DIAGNOSIS — R52 Pain, unspecified: Secondary | ICD-10-CM

## 2021-05-15 DIAGNOSIS — I70262 Atherosclerosis of native arteries of extremities with gangrene, left leg: Secondary | ICD-10-CM | POA: Diagnosis not present

## 2021-05-15 DIAGNOSIS — I4891 Unspecified atrial fibrillation: Secondary | ICD-10-CM | POA: Diagnosis not present

## 2021-05-15 DIAGNOSIS — N1831 Chronic kidney disease, stage 3a: Secondary | ICD-10-CM | POA: Diagnosis not present

## 2021-05-15 DIAGNOSIS — R2681 Unsteadiness on feet: Secondary | ICD-10-CM | POA: Diagnosis not present

## 2021-05-15 DIAGNOSIS — N179 Acute kidney failure, unspecified: Secondary | ICD-10-CM | POA: Diagnosis present

## 2021-05-15 DIAGNOSIS — F4320 Adjustment disorder, unspecified: Secondary | ICD-10-CM | POA: Diagnosis present

## 2021-05-15 DIAGNOSIS — M1611 Unilateral primary osteoarthritis, right hip: Secondary | ICD-10-CM | POA: Diagnosis present

## 2021-05-15 DIAGNOSIS — Z4781 Encounter for orthopedic aftercare following surgical amputation: Secondary | ICD-10-CM | POA: Diagnosis not present

## 2021-05-15 DIAGNOSIS — N189 Chronic kidney disease, unspecified: Secondary | ICD-10-CM | POA: Diagnosis not present

## 2021-05-15 DIAGNOSIS — M87078 Idiopathic aseptic necrosis of left toe(s): Secondary | ICD-10-CM | POA: Diagnosis not present

## 2021-05-15 DIAGNOSIS — L97529 Non-pressure chronic ulcer of other part of left foot with unspecified severity: Secondary | ICD-10-CM | POA: Diagnosis present

## 2021-05-15 DIAGNOSIS — F17290 Nicotine dependence, other tobacco product, uncomplicated: Secondary | ICD-10-CM | POA: Diagnosis present

## 2021-05-15 DIAGNOSIS — I10 Essential (primary) hypertension: Secondary | ICD-10-CM | POA: Diagnosis not present

## 2021-05-15 DIAGNOSIS — M1120 Other chondrocalcinosis, unspecified site: Secondary | ICD-10-CM | POA: Diagnosis present

## 2021-05-15 DIAGNOSIS — Z741 Need for assistance with personal care: Secondary | ICD-10-CM | POA: Diagnosis not present

## 2021-05-15 DIAGNOSIS — Z8249 Family history of ischemic heart disease and other diseases of the circulatory system: Secondary | ICD-10-CM | POA: Diagnosis not present

## 2021-05-15 DIAGNOSIS — R600 Localized edema: Secondary | ICD-10-CM | POA: Diagnosis not present

## 2021-05-15 DIAGNOSIS — E1129 Type 2 diabetes mellitus with other diabetic kidney complication: Secondary | ICD-10-CM | POA: Diagnosis not present

## 2021-05-15 DIAGNOSIS — R4189 Other symptoms and signs involving cognitive functions and awareness: Secondary | ICD-10-CM | POA: Diagnosis not present

## 2021-05-15 DIAGNOSIS — I5042 Chronic combined systolic (congestive) and diastolic (congestive) heart failure: Secondary | ICD-10-CM | POA: Diagnosis not present

## 2021-05-15 DIAGNOSIS — M6281 Muscle weakness (generalized): Secondary | ICD-10-CM | POA: Diagnosis not present

## 2021-05-15 DIAGNOSIS — Z7401 Bed confinement status: Secondary | ICD-10-CM | POA: Diagnosis not present

## 2021-05-15 DIAGNOSIS — E1122 Type 2 diabetes mellitus with diabetic chronic kidney disease: Secondary | ICD-10-CM | POA: Diagnosis not present

## 2021-05-15 DIAGNOSIS — Z8679 Personal history of other diseases of the circulatory system: Secondary | ICD-10-CM | POA: Diagnosis not present

## 2021-05-15 DIAGNOSIS — B871 Wound myiasis: Secondary | ICD-10-CM | POA: Diagnosis present

## 2021-05-15 DIAGNOSIS — E876 Hypokalemia: Secondary | ICD-10-CM | POA: Diagnosis not present

## 2021-05-15 DIAGNOSIS — G4733 Obstructive sleep apnea (adult) (pediatric): Secondary | ICD-10-CM | POA: Diagnosis not present

## 2021-05-15 DIAGNOSIS — M25559 Pain in unspecified hip: Secondary | ICD-10-CM

## 2021-05-15 DIAGNOSIS — R0902 Hypoxemia: Secondary | ICD-10-CM | POA: Diagnosis not present

## 2021-05-15 DIAGNOSIS — R262 Difficulty in walking, not elsewhere classified: Secondary | ICD-10-CM | POA: Diagnosis not present

## 2021-05-15 DIAGNOSIS — R531 Weakness: Secondary | ICD-10-CM | POA: Diagnosis not present

## 2021-05-15 LAB — COMPREHENSIVE METABOLIC PANEL
ALT: 23 U/L (ref 0–44)
AST: 16 U/L (ref 15–41)
Albumin: 3.9 g/dL (ref 3.5–5.0)
Alkaline Phosphatase: 42 U/L (ref 38–126)
Anion gap: 11 (ref 5–15)
BUN: 25 mg/dL — ABNORMAL HIGH (ref 8–23)
CO2: 28 mmol/L (ref 22–32)
Calcium: 8.9 mg/dL (ref 8.9–10.3)
Chloride: 95 mmol/L — ABNORMAL LOW (ref 98–111)
Creatinine, Ser: 1.65 mg/dL — ABNORMAL HIGH (ref 0.61–1.24)
GFR, Estimated: 44 mL/min — ABNORMAL LOW (ref >60)
Glucose, Bld: 308 mg/dL — ABNORMAL HIGH (ref 70–99)
Potassium: 3.6 mmol/L (ref 3.5–5.1)
Sodium: 134 mmol/L — ABNORMAL LOW (ref 135–145)
Total Bilirubin: 1.1 mg/dL (ref 0.3–1.2)
Total Protein: 7.9 g/dL (ref 6.5–8.1)

## 2021-05-15 LAB — CBC WITH DIFFERENTIAL/PLATELET
Abs Immature Granulocytes: 0.03 10*3/uL (ref 0.00–0.07)
Basophils Absolute: 0 10*3/uL (ref 0.0–0.1)
Basophils Relative: 0 %
Eosinophils Absolute: 0.1 10*3/uL (ref 0.0–0.5)
Eosinophils Relative: 1 %
HCT: 40.2 % (ref 39.0–52.0)
Hemoglobin: 13.2 g/dL (ref 13.0–17.0)
Immature Granulocytes: 0 %
Lymphocytes Relative: 16 %
Lymphs Abs: 1.7 10*3/uL (ref 0.7–4.0)
MCH: 29.3 pg (ref 26.0–34.0)
MCHC: 32.8 g/dL (ref 30.0–36.0)
MCV: 89.1 fL (ref 80.0–100.0)
Monocytes Absolute: 0.9 10*3/uL (ref 0.1–1.0)
Monocytes Relative: 8 %
Neutro Abs: 7.8 10*3/uL — ABNORMAL HIGH (ref 1.7–7.7)
Neutrophils Relative %: 75 %
Platelets: 211 10*3/uL (ref 150–400)
RBC: 4.51 MIL/uL (ref 4.22–5.81)
RDW: 14 % (ref 11.5–15.5)
WBC: 10.7 10*3/uL — ABNORMAL HIGH (ref 4.0–10.5)
nRBC: 0 % (ref 0.0–0.2)

## 2021-05-15 LAB — LACTIC ACID, PLASMA: Lactic Acid, Venous: 2 mmol/L (ref 0.5–1.9)

## 2021-05-15 MED ORDER — VANCOMYCIN HCL IN DEXTROSE 1-5 GM/200ML-% IV SOLN
1000.0000 mg | Freq: Once | INTRAVENOUS | Status: AC
  Administered 2021-05-15: 1000 mg via INTRAVENOUS
  Filled 2021-05-15: qty 200

## 2021-05-15 MED ORDER — PIPERACILLIN-TAZOBACTAM 3.375 G IVPB 30 MIN
3.3750 g | Freq: Once | INTRAVENOUS | Status: AC
  Administered 2021-05-15: 3.375 g via INTRAVENOUS
  Filled 2021-05-15: qty 50

## 2021-05-15 NOTE — ED Provider Notes (Signed)
[facility]-EMERGENCY DEPT Provider Note   CSN: [id number] Arrival date & time: 05/15/21  1743     History Chief Complaint  Patient presents with   Toe Pain    [name] is a 70 y.o. male.  HPI Patient presents with his daughter who assists with the history.  He presents due to left toe wound, fifth digit. Unclear when the exact onset was, but at least a few weeks ago he noticed an ulceration on the lateral aspect of the toe.  Since that time the wound has become larger.  He was seen and evaluated urgent care within the past week, seemingly encouraged to go to the ER for evaluation.  Today he is found and brought for evaluation due to gross necrosis of the left fifth toe.  He denies other complaints that are new, does have chronic concerns.  He states that he takes his medication as directed including Coumadin which he takes for A. fib. He has no history of prior similar wounds.  In triage patient was seen and evaluated by our physician assistant.  The wound was examined, and found to have substantial amounts of maggots about it.  Please see chart for documentation/pictures.     Past Medical History:  Diagnosis Date   A-fib [facility])    a. (06/04/14) TEE-DC-CV; succesful; large LA 6.2 cm   A-fib (HCC)    Ascites    CHF (congestive heart failure) (HCC)    Chronic systolic heart failure (HCC)    a. ECHO (05/2014): EF 25-30%, diff HK, RV midly dilated and sys fx mildly/mod reduced   CKD (chronic kidney disease)    CKD (chronic kidney disease) stage 3, GFR 30-59 ml/min (HCC)    Depression    Diabetes mellitus without complication (HCC)    HTN (hypertension)    Hypertension    OSA (obstructive sleep apnea)    Type 2 diabetes mellitus (HCC)     Patient Active Problem List   Diagnosis Date Noted   Toe infection 05/15/2021   Neck abscess 05/14/2016   DNR (do not resuscitate) 03/18/2016   Palliative care encounter 03/18/2016   Atrial fibrillation with  rapid ventricular response (HCC) 03/14/2016   Noncompliance with medication regimen 03/14/2016   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   Hypertensive heart disease with heart failure (HCC)    Elevated troponin    Atrial flutter (HCC) 12/28/2015   Encounter for therapeutic drug monitoring 05/24/2014   HTN (hypertension) 05/21/2014   Chronic systolic heart failure (HCC) 05/15/2014   Obstructive sleep apnea 05/13/2014   Swelling of lower extremity 05/09/2014   Depression 05/09/2014   Type 2 diabetes, uncontrolled, with renal manifestation (HCC) 05/09/2014   CKD (chronic kidney disease), stage III (HCC) 05/09/2014   Obesity (BMI 30-39.9) 05/09/2014   Ascites 05/09/2014    Past Surgical History:  Procedure Laterality Date   CARDIOVERSION N/A 06/04/2014   Procedure: CARDIOVERSION;  Surgeon: Laurey Morale, MD;  Location: [facility] ENDOSCOPY;  Service: Cardiovascular;  Laterality: N/A;   NOSE SURGERY     Nasal septum surgery   TEE WITHOUT CARDIOVERSION N/A 06/04/2014   Procedure: TRANSESOPHAGEAL ECHOCARDIOGRAM (TEE);  Surgeon: Laurey Morale, MD;  Location[facility] ENDOSCOPY;  Service: Cardiovascular;  Laterality: N/A;   TEE WITHOUT CARDIOVERSION N/A 01/06/2016   Procedure: TRANSESOPHAGEAL ECHOCARDIOGRAM (TEE);  Surgeon: Pricilla Riffle, MD;  Location: [facility] ENDOSCOPY;  Service: Cardiovascular;  Laterality: N/A;   VASECTOMY         Family History  Problem  Relation Age of Onset   Heart attack Mother        deceased   Diabetes Mother     Social History   Substance Use Topics   Alcohol use: Yes    Alcohol/week: 0.0 standard drinks   Drug use: No    Home Medications Prior to Admission medications   Medication Sig Start Date End Date Taking? Authorizing Provider  acetaminophen (TYLENOL) 500 MG tablet Take 1,000 mg by mouth every 6 (six) hours as needed.   Yes [provider]  Ascorbic Acid (VITAMIN C) 1000 MG tablet Take 1,000 mg by mouth daily.   Yes [provider]   furosemide (LASIX) 80 MG tablet Take 1 tablet (80 mg total) by mouth 2 (two) times daily. 08/14/20  Yes Bensimhon, Bevelyn Buckles, MD  losartan (COZAAR) 50 MG tablet Take 1 tablet (50 mg total) by mouth daily. 08/14/20  Yes Bensimhon, Bevelyn Buckles, MD  metoprolol succinate (TOPROL-XL) 100 MG 24 hr tablet Take 1 tablet (100 mg total) by mouth 2 (two) times daily. 08/14/20  Yes Bensimhon, Bevelyn Buckles, MD  warfarin (COUMADIN) 6 MG tablet TAKE 1/2 A TABLET TO 1 TABLET BY MOUTH DAILY AS DIRECTED BY THE COUMADIN CLINIC. Patient taking differently: Take 6 mg by mouth See admin instructions. Takes 3 mg on Tuesday and Saturday and 6 mg all other days 02/21/21  Yes Laurey Morale, MD    Allergies    Patient has no known allergies.  Review of Systems   Review of Systems  Constitutional:        Per HPI, otherwise negative  HENT:         Per HPI, otherwise negative  Respiratory:         Per HPI, otherwise negative  Cardiovascular:        Per HPI, otherwise negative  Gastrointestinal:  Negative for vomiting.  Endocrine:       Negative aside from HPI  Genitourinary:        Neg aside from HPI   Musculoskeletal:        Per HPI, otherwise negative  Skin:  Positive for wound.  Allergic/Immunologic: Negative for immunocompromised state.  Neurological:  Negative for syncope.   Physical Exam Updated Vital Signs BP (!) 152/108   Pulse 89   Temp 99.2 F (37.3 C) (Oral)   Resp 18   SpO2 95%   Physical Exam Vitals and nursing note reviewed.  Constitutional:      General: He is not in acute distress.    Appearance: He is well-developed.  HENT:     Head: Normocephalic and atraumatic.  Eyes:     Conjunctiva/sclera: Conjunctivae normal.  Cardiovascular:     Rate and Rhythm: Normal rate and regular rhythm.  Pulmonary:     Effort: Pulmonary effort is normal. No respiratory distress.     Breath sounds: No stridor.  Abdominal:     General: There is no distension.  Musculoskeletal:        General: Deformity  present.     Comments: Left fifth digit grossly necrotic, see accompanying photo.  Skin:    General: Skin is warm and dry.  Neurological:     Mental Status: He is alert and oriented to person, place, and time.     ED Results / Procedures / Treatments   Labs (all labs ordered are listed, but only abnormal results are displayed) Labs Reviewed  LACTIC ACID, PLASMA - Abnormal; Notable for the following components:  Result Value   Lactic Acid, Venous 2.0 (*)    All other components within normal limits  COMPREHENSIVE METABOLIC PANEL - Abnormal; Notable for the following components:   Sodium 134 (*)    Chloride 95 (*)    Glucose, Bld 308 (*)    BUN 25 (*)    Creatinine, Ser 1.65 (*)    GFR, Estimated 44 (*)    All other components within normal limits  CBC WITH DIFFERENTIAL/PLATELET - Abnormal; Notable for the following components:   WBC 10.7 (*)    Neutro Abs 7.8 (*)    All other components within normal limits  SARS CORONAVIRUS 2 (TAT 6-24 HRS)  URINALYSIS, ROUTINE W REFLEX MICROSCOPIC  PROTIME-INR    EKG None  Radiology DG Toe 5th Left  Result Date: 05/15/2021 CLINICAL DATA:  Pinky toe pain for approx 1 month. Decay and blackening on toe. Maggots removed today in ER. Hx: diabetes, gangrene EXAM: DG TOE 5TH LEFT COMPARISON:  None. FINDINGS: Cortical irregularity and mild periosteal reaction of the proximal fifth phalanx. No definite cortical erosion or destruction. Question may be nondisplaced fracture. No definite acute displaced fracture or dislocation. Associated superimpose subcutaneus soft tissue edema and emphysema. IMPRESSION: 1. Cortical irregularity of the proximal fifth phalanx. No definite cortical erosion or destruction. Associated superimposed subcutaneus soft tissue edema and emphysema. Consider MRI foot with intravenous contrast (if GFR greater than 30) for further evaluation of possible osteomyelitis. 2. Question maybe nondisplaced fracture. Electronically  Signed   By: Tish Frederickson M.D.   On: 05/15/2021 23:19    Procedures Procedures   Medications Ordered in ED Medications  vancomycin (VANCOCIN) IVPB 1000 mg/200 mL premix (1,000 mg Intravenous New Bag/Given 05/15/21 2359)  piperacillin-tazobactam (ZOSYN) IVPB 3.375 g (0 g Intravenous Stopped 05/15/21 2358)    ED Course  I have reviewed the triage vital signs and the nursing notes.  Pertinent labs & imaging results that were available during my care of the patient were reviewed by me and considered in my medical decision making (see chart for details).  After my initial evaluation I discussed this case with orthopedic colleague, Dr. Roda Shutters.  Patient will be admitted to internal medicine team for anticipated orthopedic procedure/amputation possibly tomorrow.  Though he does have leukocytosis, no evidence for sepsis.  Update:, Patient in no distress, I reviewed his x-ray at bedside, discussed findings with him and his daughter.  Additional findings notable for mild lactic acidosis, slight elevation in creatinine consistent with his history of CKD, and leukocytosis.  Given concern for ongoing necrosis in his distal extremity patient was started on broad-spectrum antibiotics, and as above after discussion with orthopedic, internal medicine admitted for further monitoring, management. MDM Rules/Calculators/A&P MDM Number of Diagnoses or Management Options Necrotic toes (HCC): new, needed workup   Amount and/or Complexity of Data Reviewed Clinical lab tests: ordered and reviewed Tests in the radiology section of CPT: ordered and reviewed Tests in the medicine section of CPT: reviewed and ordered Decide to obtain previous medical records or to obtain history from someone other than the patient: yes Obtain history from someone other than the patient: yes Review and summarize past medical records: yes Discuss the patient with other providers: yes Independent visualization of images, tracings, or  specimens: yes  Risk of Complications, Morbidity, and/or Mortality Presenting problems: high Diagnostic procedures: high Management options: high  Critical Care Total time providing critical care: < 30 minutes  Patient Progress Patient progress: stable   Final Clinical Impression(s) / ED Diagnoses  Final diagnoses:  Necrotic toes (HCC)     Gerhard Munch, MD 05/16/21 0006

## 2021-05-15 NOTE — ED Triage Notes (Signed)
Pt present left foot/pinky toe that has decay. Pt states that symptom started a month ago. Pt was soaking his foot to get the dead skin off the foot and noticed that the toe is decay.

## 2021-05-15 NOTE — ED Triage Notes (Signed)
Per pt, states he is suppose to have his left little toe removed-patient unsure of when this is to occur-denies pain at this time

## 2021-05-15 NOTE — ED Provider Notes (Signed)
Emergency Medicine Provider Triage Evaluation Note  [name] , a 70 y.o. male  was evaluated in triage.  Pt complains of ischemic toe.  Review of Systems  Positive: L pinky toe decay, toe pain Negative: Fever, chills, sob, weakness  Physical Exam  BP 125/79 (BP Location: Left Arm)   Pulse 99   Temp 98.9 F (37.2 C) (Oral)   Resp 18   SpO2 98%  Gen:   Awake, no distress   Resp:  Normal effort  MSK:   Moves extremities without difficulty  Other:  L pinky toe with dry gangrene along with maggots infestation. Strong foul odor  Medical Decision Making  Medically screening exam initiated at 7:26 PM.  Appropriate orders placed.  [name] was informed that the remainder of the evaluation will be completed by another provider, this initial triage assessment does not replace that evaluation, and the importance of remaining in the ED until their evaluation is complete.  Pt here with dry gangrene of left pinky toe along with maggot infestation will need toe amputation.  Maggots was extracted by me.          Fayrene Helper, PA-C 05/15/21 [name]    Gerhard Munch, MD 05/15/21 [phone]

## 2021-05-15 NOTE — ED Notes (Signed)
X-ray at bedside

## 2021-05-15 NOTE — H&P (Signed)
History and Physical    [name]:[id number] DOB: [birth year] DOA: 05/15/2021  PCP: Patient, No Pcp Per (Inactive) Patient coming from: Home  Chief Complaint: Toe infection  HPI: [name] is a 70 y.o. male with medical history significant of chronic combined systolic and diastolic CHF, hypertension, paroxysmal A. fib on Coumadin, LV thrombus per echo done in 2017, CKD stage IIIa, type 2 diabetes, OSA, depression presented to urgent care today with complaints of left pinky toe decaying and falling off.  Found to have left fifth toe necrosis with maggots and sent to the ED for further management.  Temperature 100.3 F recorded (likely false) as subsequent reading normal without administration of any antipyretic medications.  Remainder of vital signs stable.  Labs showing WBC 10.7, hemoglobin 13.2, platelet count 211K.  Sodium 134, potassium 3.6, chloride 95, bicarb 28, anion gap 11, BUN 25, creatinine 1.6 (baseline 1.2-1.3), glucose 308.  Lactic acid 2.0.  X-ray showing cortical irregularity and mild periosteal reaction of the proximal fifth phalanx.  No definite cortical erosion or destruction.  Associated superimposed subcutaneous soft tissue edema and emphysema.  Question may be nondisplaced fracture. Patient was given vancomycin and Zosyn.  ED physician discussed the case with Dr. Roda [name] from orthopedics - will plan toe amputation surgery based on INR as patient is on Coumadin.  Recommended admission at [facility].  Daughter stated about 7 weeks ago he had an infection in his left pinky toe for which he was seen at urgent care and prescribed antibiotics.  His toe is now black in color and when he soaks his foot in water, maggots come out.  Daughter thinks this is due to patient walking barefoot and his neighbor having too many cats.  Patient states his toe hurts when he wears shoes and walks.  He has no other complaints.  Denies cough, shortness of breath, chest pain, nausea,  vomiting, abdominal pain, or diarrhea.  He does not take any medications for diabetes.  He does take Coumadin.  Review of Systems:  All systems reviewed and apart from history of presenting illness, are negative.  Past Medical History:  Diagnosis Date   A-fib [facility])    a. (06/04/14) TEE-DC-CV; succesful; large LA 6.2 cm   A-fib (HCC)    Ascites    CHF (congestive heart failure) (HCC)    Chronic systolic heart failure (HCC)    a. ECHO (05/2014): EF 25-30%, diff HK, RV midly dilated and sys fx mildly/mod reduced   CKD (chronic kidney disease)    CKD (chronic kidney disease) stage 3, GFR 30-59 ml/min (HCC)    Depression    Diabetes mellitus without complication (HCC)    HTN (hypertension)    Hypertension    OSA (obstructive sleep apnea)    Type 2 diabetes mellitus (HCC)     Past Surgical History:  Procedure Laterality Date   CARDIOVERSION N/A 06/04/2014   Procedure: CARDIOVERSION;  Surgeon: Laurey Morale, MD;  Location: [facility] ENDOSCOPY;  Service: Cardiovascular;  Laterality: N/A;   NOSE SURGERY     Nasal septum surgery   TEE WITHOUT CARDIOVERSION N/A 06/04/2014   Procedure: TRANSESOPHAGEAL ECHOCARDIOGRAM (TEE);  Surgeon: Laurey Morale, MD;  Location: [facility] ENDOSCOPY;  Service: Cardiovascular;  Laterality: N/A;   TEE WITHOUT CARDIOVERSION N/A 01/06/2016   Procedure: TRANSESOPHAGEAL ECHOCARDIOGRAM (TEE);  Surgeon: Pricilla Riffle, MD;  Location: [facility] ENDOSCOPY;  Service: Cardiovascular;  Laterality: N/A;   VASECTOMY       reports current alcohol use.  He reports that he does not use drugs. No history on file for tobacco use.  No Known Allergies  Family History  Problem Relation Age of Onset   Heart attack Mother        deceased   Diabetes Mother     Prior to Admission medications   Medication Sig Start Date End Date Taking? Authorizing Provider  acetaminophen (TYLENOL) 500 MG tablet Take 1,000 mg by mouth every 6 (six) hours as needed.   Yes [provider]  Ascorbic Acid  (VITAMIN C) 1000 MG tablet Take 1,000 mg by mouth daily.   Yes [provider]  furosemide (LASIX) 80 MG tablet Take 1 tablet (80 mg total) by mouth 2 (two) times daily. 08/14/20  Yes Bensimhon, Bevelyn Buckles, MD  losartan (COZAAR) 50 MG tablet Take 1 tablet (50 mg total) by mouth daily. 08/14/20  Yes Bensimhon, Bevelyn Buckles, MD  metoprolol succinate (TOPROL-XL) 100 MG 24 hr tablet Take 1 tablet (100 mg total) by mouth 2 (two) times daily. 08/14/20  Yes Bensimhon, Bevelyn Buckles, MD  warfarin (COUMADIN) 6 MG tablet TAKE 1/2 A TABLET TO 1 TABLET BY MOUTH DAILY AS DIRECTED BY THE COUMADIN CLINIC. Patient taking differently: Take 6 mg by mouth See admin instructions. Takes 3 mg on Tuesday and Saturday and 6 mg all other days 02/21/21  Yes Laurey Morale, MD    Physical Exam: Vitals:   05/15/21 2127 05/15/21 2200 05/15/21 2300 05/16/21 0030  BP: 137/83 129/77 (!) 152/108 127/71  Pulse: 93 84 89 79  Resp: 16 17 18 18   Temp: 99.2 F (37.3 C)     TempSrc: Oral     SpO2: 96% 95% 95% 97%    Physical Exam Constitutional:      General: He is not in acute distress. HENT:     Head: Normocephalic and atraumatic.  Eyes:     Comments: Unable to assess as patient had his eyes closed the entire time  Cardiovascular:     Rate and Rhythm: Normal rate. Rhythm irregular.     Pulses: Normal pulses.  Pulmonary:     Effort: Pulmonary effort is normal. No respiratory distress.     Breath sounds: Normal breath sounds. No wheezing or rales.  Abdominal:     General: Bowel sounds are normal. There is no distension.     Palpations: Abdomen is soft.     Tenderness: There is no abdominal tenderness.  Musculoskeletal:        General: No swelling or tenderness.     Cervical back: Normal range of motion and neck supple.     Comments: Left foot: Necrotic fifth toe.  Dorsalis pedis pulse intact and foot warm to touch.  Skin:    General: Skin is warm and dry.  Neurological:     Mental Status: He is alert and oriented  to person, place, and time.                    Labs on Admission: I have personally reviewed following labs and imaging studies  CBC: Recent Labs  Lab 05/15/21 1853  WBC 10.7*  NEUTROABS 7.8*  HGB 13.2  HCT 40.2  MCV 89.1  PLT 211   Basic Metabolic Panel: Recent Labs  Lab 05/15/21 1853  NA 134*  K 3.6  CL 95*  CO2 28  GLUCOSE 308*  BUN 25*  CREATININE 1.65*  CALCIUM 8.9   GFR: CrCl cannot be calculated (Unknown ideal weight.). Liver Function Tests:  Recent Labs  Lab 05/15/21 1853  AST 16  ALT 23  ALKPHOS 42  BILITOT 1.1  PROT 7.9  ALBUMIN 3.9   No results for input(s): LIPASE, AMYLASE in the last 168 hours. No results for input(s): AMMONIA in the last 168 hours. Coagulation Profile: No results for input(s): INR, PROTIME in the last 168 hours. Cardiac Enzymes: No results for input(s): CKTOTAL, CKMB, CKMBINDEX, TROPONINI in the last 168 hours. BNP (last 3 results) No results for input(s): PROBNP in the last 8760 hours. HbA1C: No results for input(s): HGBA1C in the last 72 hours. CBG: No results for input(s): GLUCAP in the last 168 hours. Lipid Profile: No results for input(s): CHOL, HDL, LDLCALC, TRIG, CHOLHDL, LDLDIRECT in the last 72 hours. Thyroid Function Tests: No results for input(s): TSH, T4TOTAL, FREET4, T3FREE, THYROIDAB in the last 72 hours. Anemia Panel: No results for input(s): VITAMINB12, FOLATE, FERRITIN, TIBC, IRON, RETICCTPCT in the last 72 hours. Urine analysis:    Component Value Date/Time   COLORURINE YELLOW 05/01/2016 0519   APPEARANCEUR CLEAR 05/01/2016 0519   LABSPEC 1.019 05/01/2016 0519   PHURINE 7.5 05/01/2016 0519   GLUCOSEU NEGATIVE 05/01/2016 0519   HGBUR NEGATIVE 05/01/2016 0519   BILIRUBINUR NEGATIVE 05/01/2016 0519   KETONESUR NEGATIVE 05/01/2016 0519   PROTEINUR NEGATIVE 05/01/2016 0519   UROBILINOGEN 0.2 05/09/2014 0138   NITRITE NEGATIVE 05/01/2016 0519   LEUKOCYTESUR NEGATIVE 05/01/2016 0519     Radiological Exams on Admission: DG Toe 5th Left  Result Date: 05/15/2021 CLINICAL DATA:  Pinky toe pain for approx 1 month. Decay and blackening on toe. Maggots removed today in ER. Hx: diabetes, gangrene EXAM: DG TOE 5TH LEFT COMPARISON:  None. FINDINGS: Cortical irregularity and mild periosteal reaction of the proximal fifth phalanx. No definite cortical erosion or destruction. Question may be nondisplaced fracture. No definite acute displaced fracture or dislocation. Associated superimpose subcutaneus soft tissue edema and emphysema. IMPRESSION: 1. Cortical irregularity of the proximal fifth phalanx. No definite cortical erosion or destruction. Associated superimposed subcutaneus soft tissue edema and emphysema. Consider MRI foot with intravenous contrast (if GFR greater than 30) for further evaluation of possible osteomyelitis. 2. Question maybe nondisplaced fracture. Electronically Signed   By: Tish Frederickson M.D.   On: 05/15/2021 23:19    Assessment/Plan Principal Problem:   Toe necrosis (HCC) Active Problems:   HTN (hypertension)   Acute-on-chronic kidney injury (HCC)   CHF (congestive heart failure) (HCC)   Atrial fibrillation (HCC)   Necrotic left fifth toe Had maggots in the toe which were extracted in the ED.  Not febrile, tachycardic, or hypotensive.  No significant leukocytosis or lactic acidosis on labs. X-ray showing cortical irregularity and mild periosteal reaction of the proximal fifth phalanx.  No definite cortical erosion or destruction.  Associated superimposed subcutaneous soft tissue edema and emphysema.  Question may be nondisplaced fracture. -Continue antibiotics for now.  No blood cultures drawn in the ED prior to initiation of antibiotics, ordered now.  Trend lactate and WBC count. ED physician discussed the case with Dr. Roda [name] from orthopedics - will plan toe amputation surgery based on INR as patient is on Coumadin.  Recommended admission at [facility].  Keep  n.p.o. after midnight.  AKI on CKD stage IIIa BUN 25, creatinine 1.6 (baseline 1.2-1.3). -Hold home Lasix and losartan.  Repeat BMP in a.m.  Chronic combined systolic and diastolic CHF EF was 15 to 20% on echo done in May 2017.  Echo done in October 2021 showing EF 55-60% and  grade 2 diastolic dysfunction.  No signs of volume overload at this time. -Holding Lasix and losartan at this time given mild AKI.  Continue metoprolol.  Monitor volume status closely.  Hypertension Stable. -Continue metoprolol.  Holding Lasix and losartan at this time given mild AKI.  Paroxysmal A. fib Currently rate controlled. -EKG ordered.  Hold Coumadin in anticipation of toe amputation surgery.  Check INR.  Continue metoprolol.  History of LV thrombus Per echo done in May 2017.  No thrombus seen on echo done in October 2021. -Holding Coumadin in anticipation of toe amputation surgery.  Type 2 diabetes Blood glucose 308 without signs of DKA.  Patient is not on any medications.  A1c was 6.5 on labs done 5 years ago. -Repeat A1c.  Sliding scale insulin sensitive every 4 hours as patient is currently n.p.o.  DVT prophylaxis: SCDs Code Status: Patient wishes to be full code.  Daughter is requesting previously documented DNR to be removed from the patient's medical record. Family Communication: Daughter at bedside. Disposition Plan: Status is: Inpatient  Remains inpatient appropriate because:Inpatient level of care appropriate due to severity of illness and needs toe amputation surgery.  Dispo: The patient is from: Home              Anticipated d/c is to: Home              Patient currently is not medically stable to d/c.   Difficult to place patient No  Level of care: Level of care: Med-Surg  The medical decision making on this patient was of high complexity and the patient is at high risk for clinical deterioration, therefore this is a level 3 visit.  John Giovanni MD [facility]  If  7PM-7AM, please contact night-coverage [email or web address]  05/16/2021, 12:55 AM

## 2021-05-15 NOTE — ED Notes (Signed)
Patient is being discharged from the Urgent Care and sent to the Emergency Department via pov . Per Cheree Ditto, pa, patient is in need of higher level of care due to maggots in wound and necrotic tissue, toes deteriorating. Patient is aware and verbalizes understanding of plan of care.  Vitals:   05/15/21 1500  BP: 131/72  Pulse: 81  Resp: 16  Temp: 98.9 F (37.2 C)  SpO2: 100%

## 2021-05-15 NOTE — ED Provider Notes (Addendum)
[facility]    CSN: [id number] Arrival date & time: 05/15/21  1432      History   Chief Complaint Chief Complaint  Patient presents with   Foot Pain    HPI [name] is a 70 y.o. male presenting with left pinky toe decaying and falling off. Medical history diabetes.  Here today with daughter.  They state that he initially developed some redness and pain of the left pinky toe about 1 month ago, they went to another urgent care who told him to just clean it and wash it every day.  States that daughter went on vacation about a week ago, when she came back it seemed like the toe was decaying and falling off.  He denies fever/chills, shortness of breath, dizziness, chest pain.  States that he is feeling well otherwise.   HPI  Past Medical History:  Diagnosis Date   A-fib (HCC)    a. (06/04/14) TEE-DC-CV; succesful; large LA 6.2 cm   A-fib (HCC)    Ascites    CHF (congestive heart failure) (HCC)    Chronic systolic heart failure (HCC)    a. ECHO (05/2014): EF 25-30%, diff HK, RV midly dilated and sys fx mildly/mod reduced   CKD (chronic kidney disease)    CKD (chronic kidney disease) stage 3, GFR 30-59 ml/min (HCC)    Depression    Diabetes mellitus without complication (HCC)    HTN (hypertension)    Hypertension    OSA (obstructive sleep apnea)    Type 2 diabetes mellitus (HCC)     Patient Active Problem List   Diagnosis Date Noted   Neck abscess 05/14/2016   DNR (do not resuscitate) 03/18/2016   Palliative care encounter 03/18/2016   Atrial fibrillation with rapid ventricular response (HCC) 03/14/2016   Noncompliance with medication regimen 03/14/2016   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   Hypertensive heart disease with heart failure (HCC)    Elevated troponin    Atrial flutter (HCC) 12/28/2015   Encounter for therapeutic drug monitoring 05/24/2014   HTN (hypertension) 05/21/2014   Chronic systolic heart failure (HCC) 05/15/2014   Obstructive  sleep apnea 05/13/2014   Swelling of lower extremity 05/09/2014   Depression 05/09/2014   Type 2 diabetes, uncontrolled, with renal manifestation (HCC) 05/09/2014   CKD (chronic kidney disease), stage III (HCC) 05/09/2014   Obesity (BMI 30-39.9) 05/09/2014   Ascites 05/09/2014    Past Surgical History:  Procedure Laterality Date   CARDIOVERSION N/A 06/04/2014   Procedure: CARDIOVERSION;  Surgeon: Laurey Morale, MD;  Location: [facility] ENDOSCOPY;  Service: Cardiovascular;  Laterality: N/A;   NOSE SURGERY     Nasal septum surgery   TEE WITHOUT CARDIOVERSION N/A 06/04/2014   Procedure: TRANSESOPHAGEAL ECHOCARDIOGRAM (TEE);  Surgeon: Laurey Morale, MD;  Location: [facility] ENDOSCOPY;  Service: Cardiovascular;  Laterality: N/A;   TEE WITHOUT CARDIOVERSION N/A 01/06/2016   Procedure: TRANSESOPHAGEAL ECHOCARDIOGRAM (TEE);  Surgeon: Pricilla Riffle, MD;  Location: [facility] ENDOSCOPY;  Service: Cardiovascular;  Laterality: N/A;   VASECTOMY         Home Medications    Prior to Admission medications   Medication Sig Start Date End Date Taking? Authorizing Provider  furosemide (LASIX) 80 MG tablet Take 1 tablet (80 mg total) by mouth 2 (two) times daily. 08/14/20   Bensimhon, Bevelyn Buckles, MD  losartan (COZAAR) 50 MG tablet Take 1 tablet (50 mg total) by mouth daily. 08/14/20   Bensimhon, Bevelyn Buckles, MD  metoprolol succinate (TOPROL-XL) 100 MG  24 hr tablet Take 1 tablet (100 mg total) by mouth 2 (two) times daily. 08/14/20   Bensimhon, Bevelyn Buckles, MD  warfarin (COUMADIN) 6 MG tablet TAKE 1/2 A TABLET TO 1 TABLET BY MOUTH DAILY AS DIRECTED BY THE COUMADIN CLINIC. 02/21/21   Laurey Morale, MD    Family History Family History  Problem Relation Age of Onset   Heart attack Mother        deceased   Diabetes Mother     Social History Social History   Substance Use Topics   Alcohol use: Yes    Alcohol/week: 0.0 standard drinks   Drug use: No     Allergies   Patient has no known allergies.   Review of  Systems Review of Systems  Skin:  Positive for wound.  All other systems reviewed and are negative.   Physical Exam Triage Vital Signs ED Triage Vitals  Enc Vitals Group     BP 05/15/21 1500 131/72     Pulse Rate 05/15/21 1500 81     Resp 05/15/21 1500 16     Temp 05/15/21 1500 98.9 F (37.2 C)     Temp Source 05/15/21 1500 Oral     SpO2 05/15/21 1500 100 %     Weight --      Height --      Head Circumference --      Peak Flow --      Pain Score 05/15/21 1501 5     Pain Loc --      Pain Edu? --      Excl. in GC? --    No data found.  Updated Vital Signs BP 131/72 (BP Location: Right Arm)   Pulse 81   Temp 98.9 F (37.2 C) (Oral)   Resp 16   SpO2 100%   Visual Acuity Right Eye Distance:   Left Eye Distance:   Bilateral Distance:    Right Eye Near:   Left Eye Near:    Bilateral Near:     Physical Exam Vitals reviewed.  Constitutional:      Appearance: He is obese.  HENT:     Head: Normocephalic and atraumatic.  Pulmonary:     Effort: Pulmonary effort is normal.  Skin:    Comments: See image below L little toe with extensive necrotic tissue and maggots.   Neurological:     General: No focal deficit present.     Mental Status: He is alert.  Psychiatric:        Mood and Affect: Mood normal.        Behavior: Behavior normal.        Thought Content: Thought content normal.        Judgment: Judgment normal.         UC Treatments / Results  Labs (all labs ordered are listed, but only abnormal results are displayed) Labs Reviewed - No data to display  EKG   Radiology No results found.  Procedures Procedures (including critical care time)  Medications Ordered in UC Medications - No data to display  Initial Impression / Assessment and Plan / UC Course  I have reviewed the triage vital signs and the nursing notes.  Pertinent labs & imaging results that were available during my care of the patient were reviewed by me and considered in my  medical decision making (see chart for details).     This patient is a very pleasant 70 y.o. year old male presenting with toe necrosis  with maggots. Afebrile, nontachy. He is a diabetic. Sending to the ED for likely debridement. ED return precautions discussed. Patient verbalizes understanding and agreement.  Coding this visit a Level 5 as this patient was admitted for amputation.   Final Clinical Impressions(s) / UC Diagnoses   Final diagnoses:  Toe necrosis (HCC)  Type 2 diabetes mellitus with other skin complication, without long-term current use of insulin [facility])     Discharge Instructions      Please head straight to [facility] ED for further evaluation. You may require a procedure to remove the dead tissue and maggots. They may also have to manage your infection with antibiotics.      ED Prescriptions   None    PDMP not reviewed this encounter.   Rhys Martini, PA-C 05/15/21 1539    Rhys Martini, PA-C 05/18/21 [phone]

## 2021-05-15 NOTE — Discharge Instructions (Addendum)
Please head straight to [name] ED for further evaluation. You may require a procedure to remove the dead tissue and maggots. They may also have to manage your infection with antibiotics.

## 2021-05-16 ENCOUNTER — Inpatient Hospital Stay (HOSPITAL_COMMUNITY): Payer: Medicare HMO

## 2021-05-16 ENCOUNTER — Encounter (HOSPITAL_COMMUNITY): Payer: Self-pay | Admitting: Internal Medicine

## 2021-05-16 DIAGNOSIS — I96 Gangrene, not elsewhere classified: Secondary | ICD-10-CM

## 2021-05-16 DIAGNOSIS — I10 Essential (primary) hypertension: Secondary | ICD-10-CM | POA: Diagnosis not present

## 2021-05-16 DIAGNOSIS — I4891 Unspecified atrial fibrillation: Secondary | ICD-10-CM

## 2021-05-16 DIAGNOSIS — I130 Hypertensive heart and chronic kidney disease with heart failure and stage 1 through stage 4 chronic kidney disease, or unspecified chronic kidney disease: Secondary | ICD-10-CM

## 2021-05-16 DIAGNOSIS — I70262 Atherosclerosis of native arteries of extremities with gangrene, left leg: Secondary | ICD-10-CM

## 2021-05-16 DIAGNOSIS — I5042 Chronic combined systolic (congestive) and diastolic (congestive) heart failure: Secondary | ICD-10-CM

## 2021-05-16 DIAGNOSIS — I5022 Chronic systolic (congestive) heart failure: Secondary | ICD-10-CM

## 2021-05-16 DIAGNOSIS — N189 Chronic kidney disease, unspecified: Secondary | ICD-10-CM

## 2021-05-16 DIAGNOSIS — Z720 Tobacco use: Secondary | ICD-10-CM

## 2021-05-16 DIAGNOSIS — N1831 Chronic kidney disease, stage 3a: Secondary | ICD-10-CM

## 2021-05-16 DIAGNOSIS — I509 Heart failure, unspecified: Secondary | ICD-10-CM

## 2021-05-16 DIAGNOSIS — E1122 Type 2 diabetes mellitus with diabetic chronic kidney disease: Secondary | ICD-10-CM

## 2021-05-16 DIAGNOSIS — N179 Acute kidney failure, unspecified: Secondary | ICD-10-CM | POA: Diagnosis not present

## 2021-05-16 LAB — CBC
HCT: 37.8 % — ABNORMAL LOW (ref 39.0–52.0)
Hemoglobin: 12.4 g/dL — ABNORMAL LOW (ref 13.0–17.0)
MCH: 29 pg (ref 26.0–34.0)
MCHC: 32.8 g/dL (ref 30.0–36.0)
MCV: 88.3 fL (ref 80.0–100.0)
Platelets: 161 10*3/uL (ref 150–400)
RBC: 4.28 MIL/uL (ref 4.22–5.81)
RDW: 13.8 % (ref 11.5–15.5)
WBC: 8.3 10*3/uL (ref 4.0–10.5)
nRBC: 0 % (ref 0.0–0.2)

## 2021-05-16 LAB — BASIC METABOLIC PANEL
Anion gap: 8 (ref 5–15)
BUN: 21 mg/dL (ref 8–23)
CO2: 31 mmol/L (ref 22–32)
Calcium: 8.9 mg/dL (ref 8.9–10.3)
Chloride: 95 mmol/L — ABNORMAL LOW (ref 98–111)
Creatinine, Ser: 1.59 mg/dL — ABNORMAL HIGH (ref 0.61–1.24)
GFR, Estimated: 46 mL/min — ABNORMAL LOW (ref >60)
Glucose, Bld: 354 mg/dL — ABNORMAL HIGH (ref 70–99)
Potassium: 3.5 mmol/L (ref 3.5–5.1)
Sodium: 134 mmol/L — ABNORMAL LOW (ref 135–145)

## 2021-05-16 LAB — GLUCOSE, CAPILLARY
Glucose-Capillary: 372 mg/dL — ABNORMAL HIGH (ref 70–99)
Glucose-Capillary: 248 mg/dL — ABNORMAL HIGH (ref 70–99)
Glucose-Capillary: 168 mg/dL — ABNORMAL HIGH (ref 70–99)
Glucose-Capillary: 183 mg/dL — ABNORMAL HIGH (ref 70–99)
Glucose-Capillary: 319 mg/dL — ABNORMAL HIGH (ref 70–99)
Glucose-Capillary: 257 mg/dL — ABNORMAL HIGH (ref 70–99)

## 2021-05-16 LAB — HIV ANTIBODY (ROUTINE TESTING W REFLEX): HIV Screen 4th Generation wRfx: NONREACTIVE

## 2021-05-16 LAB — PROTIME-INR
INR: 5.1 (ref 0.8–1.2)
INR: 4.9 (ref 0.8–1.2)
Prothrombin Time: 46.9 seconds — ABNORMAL HIGH (ref 11.4–15.2)
Prothrombin Time: 45.8 seconds — ABNORMAL HIGH (ref 11.4–15.2)

## 2021-05-16 LAB — SARS CORONAVIRUS 2 (TAT 6-24 HRS): SARS Coronavirus 2: NEGATIVE

## 2021-05-16 LAB — APTT: aPTT: 68 seconds — ABNORMAL HIGH (ref 24–36)

## 2021-05-16 LAB — LACTIC ACID, PLASMA: Lactic Acid, Venous: 2.1 mmol/L (ref 0.5–1.9)

## 2021-05-16 MED ORDER — VANCOMYCIN HCL 1250 MG/250ML IV SOLN
1250.0000 mg | INTRAVENOUS | Status: DC
  Administered 2021-05-16: 1250 mg via INTRAVENOUS
  Filled 2021-05-16 (×2): qty 250

## 2021-05-16 MED ORDER — SODIUM CHLORIDE 0.9 % IV SOLN
2.0000 g | Freq: Two times a day (BID) | INTRAVENOUS | Status: DC
  Administered 2021-05-16 – 2021-05-20 (×9): 2 g via INTRAVENOUS
  Filled 2021-05-16 (×9): qty 2

## 2021-05-16 MED ORDER — ACETAMINOPHEN 650 MG RE SUPP: 650.0000 mg | Freq: Four times a day (QID) | RECTAL | Status: DC | PRN

## 2021-05-16 MED ORDER — ONDANSETRON HCL 4 MG/2ML IJ SOLN: 4.0000 mg | Freq: Four times a day (QID) | INTRAMUSCULAR | Status: DC | PRN

## 2021-05-16 MED ORDER — METOPROLOL SUCCINATE ER 100 MG PO TB24
100.0000 mg | ORAL_TABLET | Freq: Two times a day (BID) | ORAL | Status: DC
  Administered 2021-05-16 – 2021-05-29 (×28): 100 mg via ORAL
  Filled 2021-05-16 (×3): qty 1
  Filled 2021-05-16: qty 2
  Filled 2021-05-16 (×4): qty 1
  Filled 2021-05-16 (×2): qty 2
  Filled 2021-05-16 (×3): qty 1
  Filled 2021-05-16 (×4): qty 2
  Filled 2021-05-16 (×5): qty 1
  Filled 2021-05-16: qty 2
  Filled 2021-05-16 (×5): qty 1

## 2021-05-16 MED ORDER — ACETAMINOPHEN 325 MG PO TABS
650.0000 mg | ORAL_TABLET | Freq: Four times a day (QID) | ORAL | Status: DC | PRN
  Administered 2021-05-16 – 2021-05-18 (×4): 650 mg via ORAL
  Filled 2021-05-16 (×6): qty 2

## 2021-05-16 MED ORDER — PIPERACILLIN-TAZOBACTAM 3.375 G IVPB
3.3750 g | Freq: Three times a day (TID) | INTRAVENOUS | Status: DC
  Administered 2021-05-16: 3.375 g via INTRAVENOUS
  Filled 2021-05-16: qty 50

## 2021-05-16 MED ORDER — INSULIN ASPART 100 UNIT/ML IJ SOLN
0.0000 [IU] | INTRAMUSCULAR | Status: DC
  Administered 2021-05-16: 3 [IU] via SUBCUTANEOUS
  Administered 2021-05-16: 2 [IU] via SUBCUTANEOUS
  Administered 2021-05-16: 7 [IU] via SUBCUTANEOUS
  Administered 2021-05-16: 5 [IU] via SUBCUTANEOUS
  Administered 2021-05-16: 2 [IU] via SUBCUTANEOUS
  Administered 2021-05-16: 9 [IU] via SUBCUTANEOUS
  Administered 2021-05-17: 2 [IU] via SUBCUTANEOUS
  Administered 2021-05-17: 5 [IU] via SUBCUTANEOUS
  Administered 2021-05-17: 3 [IU] via SUBCUTANEOUS
  Administered 2021-05-17: 2 [IU] via SUBCUTANEOUS
  Administered 2021-05-17: 3 [IU] via SUBCUTANEOUS
  Administered 2021-05-17: 2 [IU] via SUBCUTANEOUS
  Administered 2021-05-18: 5 [IU] via SUBCUTANEOUS
  Administered 2021-05-18: 3 [IU] via SUBCUTANEOUS
  Administered 2021-05-18: 2 [IU] via SUBCUTANEOUS
  Filled 2021-05-16: qty 0.09

## 2021-05-16 MED ORDER — METRONIDAZOLE 500 MG/100ML IV SOLN
500.0000 mg | Freq: Three times a day (TID) | INTRAVENOUS | Status: DC
  Administered 2021-05-16 – 2021-05-24 (×22): 500 mg via INTRAVENOUS
  Filled 2021-05-16 (×22): qty 100

## 2021-05-16 NOTE — ED Notes (Signed)
[facility] called for transport to [facility] 

## 2021-05-16 NOTE — Consult Note (Signed)
ORTHOPAEDIC CONSULTATION  REQUESTING PHYSICIAN: Leatha Gilding, MD  Chief Complaint: Gangrene left little toe with maggots.  HPI: [name] is a 70 y.o. male who presents with acute gangrenous changes to the left little toe.  Patient has had a history of diabetes hypertension congestive heart failure with chronic systolic heart failure ejection fraction 25 to 30%..  He has chronic kidney disease stage III.  Past Medical History:  Diagnosis Date   A-fib [facility])    a. (06/04/14) TEE-DC-CV; succesful; large LA 6.2 cm   A-fib (HCC)    Ascites    CHF (congestive heart failure) (HCC)    Chronic systolic heart failure (HCC)    a. ECHO (05/2014): EF 25-30%, diff HK, RV midly dilated and sys fx mildly/mod reduced   CKD (chronic kidney disease)    CKD (chronic kidney disease) stage 3, GFR 30-59 ml/min (HCC)    Depression    Diabetes mellitus without complication (HCC)    HTN (hypertension)    Hypertension    OSA (obstructive sleep apnea)    Type 2 diabetes mellitus (HCC)    Past Surgical History:  Procedure Laterality Date   CARDIOVERSION N/A 06/04/2014   Procedure: CARDIOVERSION;  Surgeon: Laurey Morale, MD;  Location: [facility] ENDOSCOPY;  Service: Cardiovascular;  Laterality: N/A;   NOSE SURGERY     Nasal septum surgery   TEE WITHOUT CARDIOVERSION N/A 06/04/2014   Procedure: TRANSESOPHAGEAL ECHOCARDIOGRAM (TEE);  Surgeon: Laurey Morale, MD;  Location: [facility] ENDOSCOPY;  Service: Cardiovascular;  Laterality: N/A;   TEE WITHOUT CARDIOVERSION N/A 01/06/2016   Procedure: TRANSESOPHAGEAL ECHOCARDIOGRAM (TEE);  Surgeon: Pricilla Riffle, MD;  Location: [facility] ENDOSCOPY;  Service: Cardiovascular;  Laterality: N/A;   VASECTOMY     Social History   Socioeconomic History   Marital status: Single    Spouse name: Not on file   Number of children: Not on file   Years of education: Not on file   Highest education level: Not on file  Occupational History   Not on file  Tobacco Use   Smoking  status: Some Days    Pack years: 0.00    Types: Cigarettes   Smokeless tobacco: Not on file  Substance and Sexual Activity   Alcohol use: Yes    Alcohol/week: 0.0 standard drinks   Drug use: No   Sexual activity: Not on file  Other Topics Concern   Not on file  Social History Narrative   ** Merged History Encounter **       Lives in [place] by himself. Retired from [employer] for [employer].    Social Determinants of Health   Financial Resource Strain: Not on file  Food Insecurity: Not on file  Transportation Needs: Not on file  Physical Activity: Not on file  Stress: Not on file  Social Connections: Not on file   Family History  Problem Relation Age of Onset   Heart attack Mother        deceased   Diabetes Mother    - negative except otherwise stated in the family history section No Known Allergies Prior to Admission medications   Medication Sig Start Date End Date Taking? Authorizing Provider  acetaminophen (TYLENOL) 500 MG tablet Take 1,000 mg by mouth every 6 (six) hours as needed.   Yes [provider]  Ascorbic Acid (VITAMIN C) 1000 MG tablet Take 1,000 mg by mouth daily.   Yes [provider]  furosemide (LASIX) 80 MG tablet  Take 1 tablet (80 mg total) by mouth 2 (two) times daily. 08/14/20  Yes Bensimhon, Bevelyn Buckles, MD  losartan (COZAAR) 50 MG tablet Take 1 tablet (50 mg total) by mouth daily. 08/14/20  Yes Bensimhon, Bevelyn Buckles, MD  metoprolol succinate (TOPROL-XL) 100 MG 24 hr tablet Take 1 tablet (100 mg total) by mouth 2 (two) times daily. 08/14/20  Yes Bensimhon, Bevelyn Buckles, MD  warfarin (COUMADIN) 6 MG tablet TAKE 1/2 A TABLET TO 1 TABLET BY MOUTH DAILY AS DIRECTED BY THE COUMADIN CLINIC. Patient taking differently: Take 6 mg by mouth See admin instructions. Takes 3 mg on Tuesday and Saturday and 6 mg all other days 02/21/21  Yes Laurey Morale, MD   DG Toe 5th Left  Result Date: 05/15/2021 CLINICAL DATA:  Pinky toe pain for  approx 1 month. Decay and blackening on toe. Maggots removed today in ER. Hx: diabetes, gangrene EXAM: DG TOE 5TH LEFT COMPARISON:  None. FINDINGS: Cortical irregularity and mild periosteal reaction of the proximal fifth phalanx. No definite cortical erosion or destruction. Question may be nondisplaced fracture. No definite acute displaced fracture or dislocation. Associated superimpose subcutaneus soft tissue edema and emphysema. IMPRESSION: 1. Cortical irregularity of the proximal fifth phalanx. No definite cortical erosion or destruction. Associated superimposed subcutaneus soft tissue edema and emphysema. Consider MRI foot with intravenous contrast (if GFR greater than 30) for further evaluation of possible osteomyelitis. 2. Question maybe nondisplaced fracture. Electronically Signed   By: Tish Frederickson M.D.   On: 05/15/2021 23:19   VAS [place] ABI WITH/WO TBI  Result Date: 05/16/2021  LOWER EXTREMITY DOPPLER STUDY Patient Name:  [name]  Date of Exam:   05/16/2021 Medical Rec #: [id number]          Accession #:    [id number] Date of Birth: 02/05/1951         Patient Gender: M Patient Age:   69Y Exam Location:  [facility] Procedure:      VAS [place] ABI WITH/WO TBI Referring Phys: [id number] Gatti V Ami [place] --------------------------------------------------------------------------------  Indications: Left fifth toe dry gangrene. High Risk Factors: Hypertension, Diabetes, current smoker.  Comparison Study: No prior study Performing Technologist: Gertie Fey MHA, RVT, RDCS, RDMS  Examination Guidelines: A complete evaluation includes at minimum, Doppler waveform signals and systolic blood pressure reading at the level of bilateral brachial, anterior tibial, and posterior tibial arteries, when vessel segments are accessible. Bilateral testing is considered an integral part of a complete examination. Photoelectric Plethysmograph (PPG) waveforms and toe systolic pressure readings are included as required  and additional duplex testing as needed. Limited examinations for reoccurring indications may be performed as noted.  ABI Findings: +--------+------------------+-----+----------+--------+ Right   Rt Pressure (mmHg)IndexWaveform  Comment  +--------+------------------+-----+----------+--------+ [id number]                    triphasic          +--------+------------------+-----+----------+--------+ PTA     139               1.11 monophasic         +--------+------------------+-----+----------+--------+ DP      118               0.94 monophasic         +--------+------------------+-----+----------+--------+ +--------+------------------+-----+----------+-------+ Left    Lt Pressure (mmHg)IndexWaveform  Comment +--------+------------------+-----+----------+-------+ [id number]                    triphasic         +--------+------------------+-----+----------+-------+  PTA     100               0.80 monophasic        +--------+------------------+-----+----------+-------+ DP      84                0.67 monophasic        +--------+------------------+-----+----------+-------+ +-------+-----------+-----------+------------+------------+ ABI/TBIToday's ABIToday's TBIPrevious ABIPrevious TBI +-------+-----------+-----------+------------+------------+ Right  1.11       0.24                                +-------+-----------+-----------+------------+------------+ Left   0.80       0.21                                +-------+-----------+-----------+------------+------------+  Summary: Right: Resting right ankle-brachial index is within normal range, however given abnormal waveform this is likely falsely elevated due to medial arterial calcification. The right toe-brachial index is abnormal. Left: Resting left ankle-brachial index indicates mild left lower extremity arterial disease. The left toe-brachial index is abnormal.  *See table(s) above for measurements and  observations.     Preliminary    - pertinent xrays, CT, MRI studies were reviewed and independently interpreted  Positive ROS: All other systems have been reviewed and were otherwise negative with the exception of those mentioned in the HPI and as above.  Physical Exam: General: Alert, no acute distress Psychiatric: Patient is competent for consent with normal mood and affect Lymphatic: No axillary or cervical lymphadenopathy Cardiovascular: No pedal edema Respiratory: No cyanosis, no use of accessory musculature GI: No organomegaly, abdomen is soft and non-tender    Images:  @ENCIMAGES @  Labs:  Lab Results  Component Value Date   HGBA1C 6.5 (H) 03/14/2016   HGBA1C 6.0 (H) 12/28/2015   HGBA1C 9.0 (H) 05/09/2014    Lab Results  Component Value Date   ALBUMIN 3.9 05/15/2021   ALBUMIN 3.9 07/16/2020   ALBUMIN 3.9 05/01/2016     CBC EXTENDED Latest Ref Rng & Units 05/16/2021 05/15/2021 07/16/2020  WBC 4.0 - 10.5 K/uL 8.3 10.7(H) 7.7  RBC 4.22 - 5.81 MIL/uL 4.28 4.51 4.44  HGB 13.0 - 17.0 g/dL 12.4(L) 13.2 12.7(L)  HCT 39.0 - 52.0 % 37.8(L) 40.2 39.4  PLT 150 - 400 K/uL 161 211 157  NEUTROABS 1.7 - 7.7 K/uL - 7.8(H) -  LYMPHSABS 0.7 - 4.0 K/uL - 1.7 -    Neurologic: Patient does not have protective sensation bilateral lower extremities.   MUSCULOSKELETAL:   Skin: Examination patient has pitting edema of the left lower extremity there is a dry gangrenous ulcer to the left little toe patient does not have palpable pulses.  Ankle-brachial indices were ordered and patient has decreased ankle-brachial indices greater on the left than the right with monophasic flow showing significant arterial calcification.  Assessment: Assessment: Type 2 diabetes with gangrenous ulcer left little toe with peripheral vascular disease.  Plan: Vascular vein surgery has been consulted they will plan for arteriogram study on Monday.  I will be available if needed in follow-up.  Thank you for  the consult and the opportunity to see Mr. Friday, MD [facility] [phone] 1:43 PM

## 2021-05-16 NOTE — Plan of Care (Signed)
  Problem: Education: Goal: Ability to describe self-care measures that may prevent or decrease complications (Diabetes Survival Skills Education) will improve Outcome: Progressing   Problem: Skin Integrity: Goal: Risk for impaired skin integrity will decrease Outcome: Progressing   Problem: Tissue Perfusion: Goal: Adequacy of tissue perfusion will improve Outcome: Progressing   

## 2021-05-16 NOTE — Progress Notes (Signed)
Pharmacy Antibiotic Note  [name] is a 70 y.o. male admitted on 05/15/2021 with L 5th toe infection.  Pharmacy has been consulted for Vancomycin & Zosyn dosing.  Plan for amputation of toe 7/8.   AKI noted.   Plan: Zosyn 3.375g IV q8h (4 hour infusion). Vancomycin 1250mg  IV q24h for AUC 400-550.  Estimated AUC 504. Check Vancomycin levels at steady state Monitor renal function and cx data  Daily Scr while on Vanc + Zosyn combination  Height: 5\' 8"  (172.7 cm) Weight: 91 kg (200 lb 9.9 oz) IBW/kg (Calculated) : 68.4  Temp (24hrs), Avg:99 F (37.2 C), Min:97.9 F (36.6 C), Max:100.3 F (37.9 C)  Recent Labs  Lab 05/15/21 1853  WBC 10.7*  CREATININE 1.65*  LATICACIDVEN 2.0*    Estimated Creatinine Clearance: 45.6 mL/min (A) (by C-G formula based on SCr of 1.65 mg/dL (H)).    No Known Allergies  Antimicrobials this admission: 7/8 Zosyn >>  7/8 Vancomycin >>   Dose adjustments this admission:  Microbiology results:   Thank you for allowing pharmacy to be a part of this patient's care.  07/16/21 PharmD 05/16/2021 1:47 AM

## 2021-05-16 NOTE — Progress Notes (Signed)
Could not obtain 12 lead EKG. Probes would not stick due to excessive body hair. Patient did not want it shaved.

## 2021-05-16 NOTE — Progress Notes (Signed)
Patient ID: [name], male   DOB: June 10, 1951, 70 y.o.   MRN: [id number] With dry gangrene left little toe.  I have ordered ankle-brachial indices and will also consult vascular vein surgery.  Full note to follow.

## 2021-05-16 NOTE — Consult Note (Signed)
[facility] OF [place]  ASSESSMENT / PLAN: [name] is a 70 y.o. male with atherosclerosis of native arteries of left lower extremity causing gangrene.  Patient counseled patients with chronic limb threatening ischemia have an annual risk of cardiovascular mortality of 25% and a annual amputation risk of 25%. Aggressive risk factor modification and intervention for limb salvage is indicated..  Recommend the following which can slow the progression of atherosclerosis and reduce the risk of major adverse cardiac / limb events:  Complete cessation from all tobacco products. Blood glucose control with goal A1c < 7%. Blood pressure control with goal blood pressure < 140/90 mmHg. Lipid reduction therapy with goal LDL-C <100 mg/dL (<43 if symptomatic from PAD).  Aspirin 81mg  PO QD.  Atorvastatin 40-80mg  PO QD (or other "high intensity" statin therapy).  Plan aortogram, left lower extremity angiogram with possible intervention via right common femoral artery in cath lab 05/19/21 with Dr. Chestine Spore.  CHIEF COMPLAINT: left fifth toe gangrene  HISTORY OF PRESENT ILLNESS: [name] is a 70 y.o. male admitted to the internal medicine service for left fifth toe gangrene.  Patient reports he has had pain and blistering beginning about 7 weeks ago.  This progressed over time.  The toe was found to be frankly necrotic with maggot infestation.  Patient reports no history of intermittent claudication or ischemic rest pain.  He is ambulatory.  VASCULAR SURGICAL HISTORY: None  VASCULAR RISK FACTORS: Negative history of cerebrovascular disease / stroke / transient ischemic attack. Negative history of coronary artery disease. No history of PCI. No history of CABG.  Positive history of diabetes mellitus. Last A1c 6.5. Positive history of smoking. "Occasional cigar."  Positive history of hypertension.  Positive history of chronic kidney disease. Last GFR 46.  Negative history  of chronic obstructive pulmonary disease.  Past Medical History:  Diagnosis Date   A-fib [facility])    a. (06/04/14) TEE-DC-CV; succesful; large LA 6.2 cm   A-fib (HCC)    Ascites    CHF (congestive heart failure) (HCC)    Chronic systolic heart failure (HCC)    a. ECHO (05/2014): EF 25-30%, diff HK, RV midly dilated and sys fx mildly/mod reduced   CKD (chronic kidney disease)    CKD (chronic kidney disease) stage 3, GFR 30-59 ml/min (HCC)    Depression    Diabetes mellitus without complication (HCC)    HTN (hypertension)    Hypertension    OSA (obstructive sleep apnea)    Type 2 diabetes mellitus (HCC)     Past Surgical History:  Procedure Laterality Date   CARDIOVERSION N/A 06/04/2014   Procedure: CARDIOVERSION;  Surgeon: Laurey Morale, MD;  Location: [facility] ENDOSCOPY;  Service: Cardiovascular;  Laterality: N/A;   NOSE SURGERY     Nasal septum surgery   TEE WITHOUT CARDIOVERSION N/A 06/04/2014   Procedure: TRANSESOPHAGEAL ECHOCARDIOGRAM (TEE);  Surgeon: Laurey Morale, MD;  Location: [facility] ENDOSCOPY;  Service: Cardiovascular;  Laterality: N/A;   TEE WITHOUT CARDIOVERSION N/A 01/06/2016   Procedure: TRANSESOPHAGEAL ECHOCARDIOGRAM (TEE);  Surgeon: Pricilla Riffle, MD;  Location: [facility] ENDOSCOPY;  Service: Cardiovascular;  Laterality: N/A;   VASECTOMY      Family History  Problem Relation Age of Onset   Heart attack Mother        deceased   Diabetes Mother     Social History   Socioeconomic History   Marital status: Single    Spouse name: Not on file   Number of children: Not  on file   Years of education: Not on file   Highest education level: Not on file  Occupational History   Not on file  Tobacco Use   Smoking status: Some Days    Pack years: 0.00    Types: Cigarettes   Smokeless tobacco: Not on file  Substance and Sexual Activity   Alcohol use: Yes    Alcohol/week: 0.0 standard drinks   Drug use: No   Sexual activity: Not on file  Other Topics Concern   Not on file   Social History Narrative   ** Merged History Encounter **       Lives in [place] by himself. Retired from [employer] for [employer].    Social Determinants of Health   Financial Resource Strain: Not on file  Food Insecurity: Not on file  Transportation Needs: Not on file  Physical Activity: Not on file  Stress: Not on file  Social Connections: Not on file  Intimate Partner Violence: Not on file    No Known Allergies  Current Facility-Administered Medications  Medication Dose Route Frequency Provider Last Rate Last Admin   acetaminophen (TYLENOL) tablet 650 mg  650 mg Oral Q6H PRN John Giovanni, MD       Or   acetaminophen (TYLENOL) suppository 650 mg  650 mg Rectal Q6H PRN John Giovanni, MD       ceFEPIme (MAXIPIME) 2 g in sodium chloride 0.9 % 100 mL IVPB  2 g Intravenous Q12H Leatha Gilding, MD 200 mL/hr at 05/16/21 1244 2 g at 05/16/21 1244   insulin aspart (novoLOG) injection 0-9 Units  0-9 Units Subcutaneous Q4H John Giovanni, MD   2 Units at 05/16/21 1245   metoprolol succinate (TOPROL-XL) 24 hr tablet 100 mg  100 mg Oral BID John Giovanni, MD   100 mg at 05/16/21 1004   metroNIDAZOLE (FLAGYL) IVPB 500 mg  500 mg Intravenous Q8H Leatha Gilding, MD 100 mL/hr at 05/16/21 1413 500 mg at 05/16/21 1413   ondansetron (ZOFRAN) injection 4 mg  4 mg Intravenous Q6H PRN Leatha Gilding, MD       vancomycin (VANCOREADY) IVPB 1250 mg/250 mL  1,250 mg Intravenous Q24H Phylliss Blakes, RPH 166.7 mL/hr at 05/16/21 1006 1,250 mg at 05/16/21 1006    REVIEW OF SYSTEMS:  [X]  denotes positive finding, [ ]  denotes negative finding Cardiac  Comments:  Chest pain or chest pressure:    Shortness of breath upon exertion:    Short of breath when lying flat:    Irregular heart rhythm:        Vascular    Pain in calf, thigh, or hip brought on by ambulation:    Pain in feet at night that wakes you up from your sleep:     Blood clot in your  veins:    Leg swelling:         Pulmonary    Oxygen at home:    Productive cough:     Wheezing:         Neurologic    Sudden weakness in arms or legs:     Sudden numbness in arms or legs:     Sudden onset of difficulty speaking or slurred speech:    Temporary loss of vision in one eye:     Problems with dizziness:         Gastrointestinal    Blood in stool:     Vomited blood:  Genitourinary    Burning when urinating:     Blood in urine:        Psychiatric    Major depression:         Hematologic    Bleeding problems:    Problems with blood clotting too easily:        Skin    Rashes or ulcers:        Constitutional    Fever or chills:      PHYSICAL EXAM Vitals:   05/15/21 2300 05/16/21 0030 05/16/21 0129 05/16/21 0834  BP: (!) 152/108 127/71 137/82 130/80  Pulse: 89 79 86 72  Resp: 18 18 18 17   Temp:   97.9 F (36.6 C) 98.3 F (36.8 C)  TempSrc:   Oral Oral  SpO2: 95% 97% 97% 94%  Weight:   91 kg   Height:   5\' 8"  (1.727 m)    Constitutional: well appearing. no distress. Appears well nourished.  Neurologic: CN intact. no focal findings. no sensory loss. Psychiatric:  Mood and affect symmetric and appropriate. Eyes:  No icterus. No conjunctival pallor. Ears, nose, throat:  mucous membranes moist. Midline trachea.  Cardiac: regular rate and rhythm.  Respiratory:  unlabored. Abdominal:  soft, non-tender, non-distended.  Peripheral vascular: 2+ popliteal pulse. No palpable pedal pulses. Extremity: No edema. No cyanosis. No pallor.  Skin: Dry gangrene of left fifth toe. No other ulceration or gangrene.  Lymphatic: no Stemmer's sign. no palpable lymphadenopathy.  PERTINENT LABORATORY AND RADIOLOGIC DATA  Most recent CBC CBC Latest Ref Rng & Units 05/16/2021 05/15/2021 07/16/2020  WBC 4.0 - 10.5 K/uL 8.3 10.7(H) 7.7  Hemoglobin 13.0 - 17.0 g/dL 12.4(L) 13.2 12.7(L)  Hematocrit 39.0 - 52.0 % 37.8(L) 40.2 39.4  Platelets 150 - 400 K/uL 161 211 157      Most recent CMP CMP Latest Ref Rng & Units 05/16/2021 05/15/2021 08/06/2020  Glucose 70 - 99 mg/dL 07/16/2021) 08/08/2020) 326(Z)  BUN 8 - 23 mg/dL 21 124(P) 21  Creatinine 0.61 - 1.24 mg/dL 809(X) 83(J) 8.25(K)  Sodium 135 - 145 mmol/L 134(L) 134(L) 138  Potassium 3.5 - 5.1 mmol/L 3.5 3.6 3.8  Chloride 98 - 111 mmol/L 95(L) 95(L) 98  CO2 22 - 32 mmol/L 31 28 30   Calcium 8.9 - 10.3 mg/dL 8.9 8.9 9.3  Total Protein 6.5 - 8.1 g/dL - 7.9 -  Total Bilirubin 0.3 - 1.2 mg/dL - 1.1 -  Alkaline Phos 38 - 126 U/L - 42 -  AST 15 - 41 U/L - 16 -  ALT 0 - 44 U/L - 23 -    Renal function Estimated Creatinine Clearance: 47.3 mL/min (A) (by C-G formula based on SCr of 1.59 mg/dL (H)).  Hgb A1c MFr Bld (%)  Date Value  03/14/2016 6.5 (H)    LDL Cholesterol  Date Value Ref Range Status  03/15/2016 75 0 - 99 mg/dL Final    Comment:           Total Cholesterol/HDL:CHD Risk Coronary Heart Disease Risk Table                     Men   Women  1/2 Average Risk   3.4   3.3  Average Risk       5.0   4.4  2 X Average Risk   9.6   7.1  3 X Average Risk  23.4   11.0        Use the calculated Patient Ratio above  and the CHD Risk Table to determine the patient's CHD Risk.        ATP III CLASSIFICATION (LDL):  <100     mg/dL   Optimal  951-884  mg/dL   Near or Above                    Optimal  130-159  mg/dL   Borderline  166-063  mg/dL   High  >016     mg/dL   Very High      Vascular Imaging: ABI 05/16/21 R 1.1 L 0.8 Non-reliable ABI with DM / CKD Monophasic waveforms suggest tibial disease. TBIs suggest severe PAD  Rande Brunt. Lenell Antu, MD [facility] Phone Number: [phone] 05/16/2021 4:24 PM

## 2021-05-16 NOTE — Progress Notes (Signed)
I am aware of patient and have discussed with Dr. Lajoyce Corners who will see for formal consultation.  Please keep patient NPO for possible surgery today.

## 2021-05-16 NOTE — Progress Notes (Signed)
ABI completed. Refer to "CV Proc" under chart review to view preliminary results.  05/16/2021 9:42 AM Eula Fried., MHA, RVT, RDCS, RDMS

## 2021-05-16 NOTE — Progress Notes (Signed)
PROGRESS NOTE  [name] WNI:[id number] DOB: 1951-06-19 DOA: 05/15/2021 PCP: Patient, No Pcp Per (Inactive)   LOS: 1 day   Brief Narrative / Interim history: 70 year old male with history of chronic combined CHF, HTN, PAF on Coumadin, LV thrombus, CKD 3A, DM2 comes into the hospital with pain in the left pinky toe as well as the toe has been turning black.  He was found to have necrosis with maggots, was febrile to 100.3 and admitted to the hospital.  Orthopedic surgery consulted  Subjective / 24h Interval events: Doing well this morning, no chest pain, no shortness of breath.  Assessment & Plan: Principal Problem Necrotic left fifth toe-orthopedic surgery consulted, appreciate input.  ABI pending, may need vascular surgery consultation per Ortho -Monitor blood cultures, they were obtained after initial antibiotics. -Continue broad-spectrum antibiotics for now until the plan is clear  Active Problems Acute kidney injury on chronic kidney disease stage IIIa-Baseline creatinine 1.2-1.3, currently at 1.5 acceptable  Chronic combined systolic and diastolic CHF-Echo in 2017 showed EF 15 to 20%, has recovered in August 28 2154-60% and grade 2 DD.  Losartan and Lasix are on hold given slight elevation in creatinine.  Continue to hold.  He is euvolemic  Essential hypertension-he is normotensive, continue metoprolol.  Lasix and ARB on hold  PAF-on metoprolol, continue to hold Coumadin for probable surgery.  History of LV thrombus-most recent echo was negative for thrombus  DM2-continue sliding scale  CBG (last 3)  Recent Labs    05/16/21 0450 05/16/21 0836 05/16/21 1158  GLUCAP 248* 168* 183*   Check INR.  Continue metoprolol.  Scheduled Meds:  insulin aspart  0-9 Units Subcutaneous Q4H   metoprolol succinate  100 mg Oral BID   Continuous Infusions:  ceFEPime (MAXIPIME) IV     metronidazole     vancomycin 1,250 mg (05/16/21 1006)   PRN Meds:.acetaminophen **OR**  acetaminophen, ondansetron (ZOFRAN) IV  Diet Orders (From admission, onward)     Start     Ordered   05/16/21 0041  Diet NPO time specified Except for: Sips with Meds  Diet effective now       Question:  Except for  Answer:  Clearance Coots with Meds   05/16/21 0041            DVT prophylaxis: SCDs Start: 05/16/21 0041     Code Status: Full Code  Family Communication: Daughter present at bedside  Status is: Inpatient  Remains inpatient appropriate because:Inpatient level of care appropriate due to severity of illness  Dispo: The patient is from: Home              Anticipated d/c is to: Home              Patient currently is not medically stable to d/c.   Difficult to place patient No   Level of care: Med-Surg  Consultants:  Orthopedic surgery  Procedures:  None   Microbiology  Blood cultures   Antimicrobials: Vanc / Cefepime / Metronidazole 7/7 >>    Objective: Vitals:   05/15/21 2300 05/16/21 0030 05/16/21 0129 05/16/21 0834  BP: (!) 152/108 127/71 137/82 130/80  Pulse: 89 79 86 72  Resp: 18 18 18 17   Temp:   97.9 F (36.6 C) 98.3 F (36.8 C)  TempSrc:   Oral Oral  SpO2: 95% 97% 97% 94%  Weight:   91 kg   Height:   5\' 8"  (1.727 m)     Intake/Output Summary (Last 24 hours) at 05/16/2021  1211 Last data filed at 05/16/2021 0457 Gross per 24 hour  Intake 200 ml  Output 650 ml  Net -450 ml   Filed Weights   05/16/21 0129  Weight: 91 kg    Examination:  Constitutional: NAD Eyes: no scleral icterus ENMT: Mucous membranes are moist.  Neck: normal, supple Respiratory: clear to auscultation bilaterally, no wheezing, no crackles. Normal respiratory effort. No accessory muscle use.  Cardiovascular: Regular rate and rhythm, no murmurs / rubs / gallops. No LE edema Abdomen: non distended, no tenderness. Bowel sounds positive.  Musculoskeletal: no clubbing /left fifth toe necrosis.  Skin: no rashes Neurologic: CN 2-12 grossly intact. Strength 5/5 in all 4.     Data Reviewed: I have independently reviewed following labs and imaging studies   CBC: Recent Labs  Lab 05/15/21 1853 05/16/21 0310  WBC 10.7* 8.3  NEUTROABS 7.8*  --   HGB 13.2 12.4*  HCT 40.2 37.8*  MCV 89.1 88.3  PLT 211 161   Basic Metabolic Panel: Recent Labs  Lab 05/15/21 1853 05/16/21 0310  NA 134* 134*  K 3.6 3.5  CL 95* 95*  CO2 28 31  GLUCOSE 308* 354*  BUN 25* 21  CREATININE 1.65* 1.59*  CALCIUM 8.9 8.9   Liver Function Tests: Recent Labs  Lab 05/15/21 1853  AST 16  ALT 23  ALKPHOS 42  BILITOT 1.1  PROT 7.9  ALBUMIN 3.9   Coagulation Profile: Recent Labs  Lab 05/15/21 2323 05/16/21 0310  INR 5.1* 4.9*   HbA1C: No results for input(s): HGBA1C in the last 72 hours. CBG: Recent Labs  Lab 05/16/21 0220 05/16/21 0450 05/16/21 0836 05/16/21 1158  GLUCAP 372* 248* 168* 183*    Recent Results (from the past 240 hour(s))  SARS CORONAVIRUS 2 (TAT 6-24 HRS) Nasopharyngeal Nasopharyngeal Swab     Status: None   Collection Time: 05/15/21 11:23 PM   Specimen: Nasopharyngeal Swab  Result Value Ref Range Status   SARS Coronavirus 2 NEGATIVE NEGATIVE Final    Comment: (NOTE) SARS-CoV-2 target nucleic acids are NOT DETECTED.  The SARS-CoV-2 RNA is generally detectable in upper and lower respiratory specimens during the acute phase of infection. Negative results do not preclude SARS-CoV-2 infection, do not rule out co-infections with other pathogens, and should not be used as the sole basis for treatment or other patient management decisions. Negative results must be combined with clinical observations, patient history, and epidemiological information. The expected result is Negative.  Fact Sheet for Patients: HairSlick.no  Fact Sheet for Healthcare Providers: quierodirigir.com  This test is not yet approved or cleared by the [place] FDA and  has been authorized for detection  and/or diagnosis of SARS-CoV-2 by FDA under an Emergency Use Authorization (EUA). This EUA will remain  in effect (meaning this test can be used) for the duration of the COVID-19 declaration under Se ction 564(b)(1) of the Act, 21 U.S.C. section 360bbb-3(b)(1), unless the authorization is terminated or revoked sooner.  Performed at [facility], [address] [place], [place] [address]      Radiology Studies: DG Toe 5th Left  Result Date: 05/15/2021 CLINICAL DATA:  Pinky toe pain for approx 1 month. Decay and blackening on toe. Maggots removed today in ER. Hx: diabetes, gangrene EXAM: DG TOE 5TH LEFT COMPARISON:  None. FINDINGS: Cortical irregularity and mild periosteal reaction of the proximal fifth phalanx. No definite cortical erosion or destruction. Question may be nondisplaced fracture. No definite acute displaced fracture or dislocation. Associated superimpose  subcutaneus soft tissue edema and emphysema. IMPRESSION: 1. Cortical irregularity of the proximal fifth phalanx. No definite cortical erosion or destruction. Associated superimposed subcutaneus soft tissue edema and emphysema. Consider MRI foot with intravenous contrast (if GFR greater than 30) for further evaluation of possible osteomyelitis. 2. Question maybe nondisplaced fracture. Electronically Signed   By: Tish Frederickson M.D.   On: 05/15/2021 23:19   VAS [place] ABI WITH/WO TBI  Result Date: 05/16/2021  LOWER EXTREMITY DOPPLER STUDY Patient Name:  EMAN MORIMOTO  Date of Exam:   05/16/2021 Medical Rec #: [id number]          Accession #:    [id number] Date of Birth: [birth year]         Patient Gender: M Patient Age:   69Y Exam Location:  [facility] Procedure:      VAS [place] ABI WITH/WO TBI Referring Phys: [id number] MARCUS V DUDA --------------------------------------------------------------------------------  Indications: Left fifth toe dry gangrene. High Risk Factors: Hypertension, Diabetes, current smoker.  Comparison Study:  No prior study Performing Technologist: Gertie Fey MHA, RVT, RDCS, RDMS  Examination Guidelines: A complete evaluation includes at minimum, Doppler waveform signals and systolic blood pressure reading at the level of bilateral brachial, anterior tibial, and posterior tibial arteries, when vessel segments are accessible. Bilateral testing is considered an integral part of a complete examination. Photoelectric Plethysmograph (PPG) waveforms and toe systolic pressure readings are included as required and additional duplex testing as needed. Limited examinations for reoccurring indications may be performed as noted.  ABI Findings: +--------+------------------+-----+----------+--------+ Right   Rt Pressure (mmHg)IndexWaveform  Comment  +--------+------------------+-----+----------+--------+ [id number]                    triphasic          +--------+------------------+-----+----------+--------+ PTA     139               1.11 monophasic         +--------+------------------+-----+----------+--------+ DP      118               0.94 monophasic         +--------+------------------+-----+----------+--------+ +--------+------------------+-----+----------+-------+ Left    Lt Pressure (mmHg)IndexWaveform  Comment +--------+------------------+-----+----------+-------+ [id number]                    triphasic         +--------+------------------+-----+----------+-------+ PTA     100               0.80 monophasic        +--------+------------------+-----+----------+-------+ DP      84                0.67 monophasic        +--------+------------------+-----+----------+-------+ +-------+-----------+-----------+------------+------------+ ABI/TBIToday's ABIToday's TBIPrevious ABIPrevious TBI +-------+-----------+-----------+------------+------------+ Right  1.11       0.24                                +-------+-----------+-----------+------------+------------+ Left    0.80       0.21                                +-------+-----------+-----------+------------+------------+  Summary: Right: Resting right ankle-brachial index is within normal range, however given abnormal waveform this is likely falsely elevated due to medial arterial calcification. The right toe-brachial index is abnormal. Left: Resting left ankle-brachial index  indicates mild left lower extremity arterial disease. The left toe-brachial index is abnormal.  *See table(s) above for measurements and observations.     Preliminary      Pamella Pert, MD, PhD [facility]  Between 7 am - 7 pm I am available, please contact me via Amion (for emergencies) or Securechat (non urgent messages)  Between 7 pm - 7 am I am not available, please contact night coverage MD/APP via Amion

## 2021-05-16 NOTE — ED Notes (Signed)
[facility] at bedside 

## 2021-05-16 NOTE — TOC Initial Note (Signed)
Transition of Care [facility]) - Initial/Assessment Note    Patient Details  Name: [name] MRN: [id number] Date of Birth: 17-Feb-1951  Transition of Care [facility]) CM/SW Contact:    Lockie Pares, RN Phone Number: 05/16/2021, 4:45 PM  Clinical Narrative:                  Patient admitted with gangrenous toe.  ABI's in progress, consulted for surgery for possible surgery to metatarsal this evening or tomorrow. May need HH services, CM will follow for needs  Expected Discharge Plan: Home w Home Health Services Barriers to Discharge: Continued Medical Work up   Patient Goals and CMS Choice        Expected Discharge Plan and Services Expected Discharge Plan: Home w Home Health Services   Discharge Planning Services: CM Consult   Living arrangements for the past 2 months: Single Family Home                                      Prior Living Arrangements/Services Living arrangements for the past 2 months: Single Family Home Lives with:: Self Patient language and need for interpreter reviewed:: Yes        Need for Family Participation in Patient Care: Yes (Comment) Care giver support system in place?: Yes (comment)   Criminal Activity/Legal Involvement Pertinent to Current Situation/Hospitalization: No - Comment as needed  Activities of Daily Living Home Assistive Devices/Equipment: None ADL Screening (condition at time of admission) Patient's cognitive ability adequate to safely complete daily activities?: Yes Is the patient deaf or have difficulty hearing?: No Does the patient have difficulty seeing, even when wearing glasses/contacts?: No Does the patient have difficulty concentrating, remembering, or making decisions?: No Patient able to express need for assistance with ADLs?: Yes Does the patient have difficulty dressing or bathing?: No Independently performs ADLs?: Yes (appropriate for developmental age) Does the patient have difficulty walking or  climbing stairs?: No Weakness of Legs: Left Weakness of Arms/Hands: None  Permission Sought/Granted                  Emotional Assessment       Orientation: : Oriented to Self, Oriented to Place, Oriented to  Time, Oriented to Situation Alcohol / Substance Use: Not Applicable Psych Involvement: No (comment)  Admission diagnosis:  Toe infection [L08.9] Necrotic toes (HCC) [I96] Patient Active Problem List   Diagnosis Date Noted   Toe necrosis (HCC) 05/16/2021   Acute-on-chronic kidney injury (HCC) 05/16/2021   CHF (congestive heart failure) (HCC) 05/16/2021   Atrial fibrillation (HCC) 05/16/2021   Neck abscess 05/14/2016   Palliative care encounter 03/18/2016   Atrial fibrillation with rapid ventricular response (HCC) 03/14/2016   Noncompliance with medication regimen 03/14/2016   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   Hypertensive heart disease with heart failure (HCC)    Elevated troponin    Atrial flutter (HCC) 12/28/2015   Encounter for therapeutic drug monitoring 05/24/2014   HTN (hypertension) 05/21/2014   Chronic systolic heart failure (HCC) 05/15/2014   Obstructive sleep apnea 05/13/2014   Swelling of lower extremity 05/09/2014   Depression 05/09/2014   Type 2 diabetes, uncontrolled, with renal manifestation (HCC) 05/09/2014   CKD (chronic kidney disease), stage III (HCC) 05/09/2014   Obesity (BMI 30-39.9) 05/09/2014   Ascites 05/09/2014   PCP:  Patient, No Pcp Per (Inactive) Pharmacy:   [facility] [address]  [place] [address] Phone: [phone] Fax: [phone]     Social Determinants of Health (SDOH) Interventions    Readmission Risk Interventions No flowsheet data found.

## 2021-05-17 DIAGNOSIS — I10 Essential (primary) hypertension: Secondary | ICD-10-CM | POA: Diagnosis not present

## 2021-05-17 DIAGNOSIS — I5042 Chronic combined systolic (congestive) and diastolic (congestive) heart failure: Secondary | ICD-10-CM | POA: Diagnosis not present

## 2021-05-17 DIAGNOSIS — I96 Gangrene, not elsewhere classified: Secondary | ICD-10-CM | POA: Diagnosis not present

## 2021-05-17 DIAGNOSIS — N179 Acute kidney failure, unspecified: Secondary | ICD-10-CM | POA: Diagnosis not present

## 2021-05-17 LAB — GLUCOSE, CAPILLARY
Glucose-Capillary: 163 mg/dL — ABNORMAL HIGH (ref 70–99)
Glucose-Capillary: 170 mg/dL — ABNORMAL HIGH (ref 70–99)
Glucose-Capillary: 197 mg/dL — ABNORMAL HIGH (ref 70–99)
Glucose-Capillary: 203 mg/dL — ABNORMAL HIGH (ref 70–99)
Glucose-Capillary: 235 mg/dL — ABNORMAL HIGH (ref 70–99)
Glucose-Capillary: 293 mg/dL — ABNORMAL HIGH (ref 70–99)

## 2021-05-17 LAB — CREATININE, SERUM
Creatinine, Ser: 1.38 mg/dL — ABNORMAL HIGH (ref 0.61–1.24)
GFR, Estimated: 55 mL/min — ABNORMAL LOW (ref >60)

## 2021-05-17 LAB — PROTIME-INR
INR: 4 — ABNORMAL HIGH (ref 0.8–1.2)
Prothrombin Time: 39.2 seconds — ABNORMAL HIGH (ref 11.4–15.2)

## 2021-05-17 MED ORDER — HEPARIN (PORCINE) 25000 UT/250ML-% IV SOLN
1350.0000 [IU]/h | INTRAVENOUS | Status: DC
  Filled 2021-05-17: qty 250

## 2021-05-17 MED ORDER — VANCOMYCIN HCL IN DEXTROSE 1-5 GM/200ML-% IV SOLN
1000.0000 mg | INTRAVENOUS | Status: DC
  Administered 2021-05-17 – 2021-05-19 (×3): 1000 mg via INTRAVENOUS
  Filled 2021-05-17 (×5): qty 200

## 2021-05-17 MED ORDER — FUROSEMIDE 40 MG PO TABS
40.0000 mg | ORAL_TABLET | Freq: Two times a day (BID) | ORAL | Status: DC
  Administered 2021-05-17 – 2021-05-24 (×12): 40 mg via ORAL
  Filled 2021-05-17 (×12): qty 1

## 2021-05-17 NOTE — Progress Notes (Signed)
[name] HYW:[id number] DOB: 29-Mar-1951 DOA: 05/15/2021 PCP: Patient, No Pcp Per (Inactive)   LOS: 2 days   Brief Narrative / Interim history: 70 year old male with history of chronic combined CHF, HTN, PAF on Coumadin, LV thrombus, CKD 3A, DM2 comes into the hospital with pain in the left pinky toe as well as the toe has been turning black.  He was found to have necrosis with maggots, was febrile to 100.3 and admitted to the hospital.  Orthopedic surgery consulted  Subjective / 24h Interval events: No complaints, no chest pain, no shortness of breath  Assessment & Plan: Principal Problem Necrotic left fifth toe-orthopedic and vascular surgery consulted, appreciate input.   -Patient will undergo arteriogram on Monday -Monitor blood cultures, they were obtained after initial antibiotics. -Continue broad-spectrum antibiotics over the weekend  Active Problems Acute kidney injury on chronic kidney disease stage IIIa-Baseline creatinine 1.2-1.3, currently at baseline  Chronic combined systolic and diastolic CHF-Echo in 2017 showed EF 15 to 20%, has recovered in August 28 2154-60% and grade 2 DD.  Losartan and Lasix are on hold given slight elevation in creatinine.  Continue to hold.  He is euvolemic  Essential hypertension-he is normotensive, continue metoprolol.  Lasix and ARB on hold  PAF-on metoprolol, continue to hold Coumadin for probable surgery.  History of LV thrombus-most recent echo was negative for thrombus  DM2-continue sliding scale, CBGs acceptable  CBG (last 3)  Recent Labs    05/17/21 0001 05/17/21 0425 05/17/21 0846  GLUCAP 170* 163* 203*    Check INR.  Continue metoprolol.  Scheduled Meds:  insulin aspart  0-9 Units Subcutaneous Q4H   metoprolol succinate  100 mg Oral BID   Continuous Infusions:  ceFEPime (MAXIPIME) IV 2 g (05/17/21 0057)   metronidazole 500 mg (05/17/21 0559)   vancomycin 1,000 mg (05/17/21 1043)   PRN  Meds:.acetaminophen **OR** acetaminophen, ondansetron (ZOFRAN) IV  Diet Orders (From admission, onward)     Start     Ordered   05/16/21 1252  Diet Carb Modified Fluid consistency: Thin; Room service appropriate? Yes  Diet effective now       Question Answer Comment  Diet-HS Snack? Nothing   Calorie Level Medium 1600-2000   Fluid consistency: Thin   Room service appropriate? Yes      05/16/21 1251            DVT prophylaxis: SCDs Start: 05/16/21 0041     Code Status: Full Code  Family Communication: Daughter present at bedside  Status is: Inpatient  Remains inpatient appropriate because:Inpatient level of care appropriate due to severity of illness  Dispo: The patient is from: Home              Anticipated d/c is to: Home              Patient currently is not medically stable to d/c.   Difficult to place patient No   Level of care: Med-Surg  Consultants:  Orthopedic surgery  Procedures:  None   Microbiology  Blood cultures   Antimicrobials: Vanc / Cefepime / Metronidazole 7/7 >>    Objective: Vitals:   05/16/21 0834 05/16/21 1600 05/16/21 2009 05/17/21 0700  BP: 130/80 133/82 (!) 143/68 135/68  Pulse: 72 99 60 83  Resp: 17  16 18   Temp: 98.3 F (36.8 C) 100 F (37.8 C) 97.6 F (36.4 C) 98.5 F (36.9 C)  TempSrc: Oral Oral Oral Oral  SpO2: 94% 93% 96% 95%  Weight:  Height:        Intake/Output Summary (Last 24 hours) at 05/17/2021 1051 Last data filed at 05/17/2021 0920 Gross per 24 hour  Intake 1010 ml  Output 550 ml  Net 460 ml    Filed Weights   05/16/21 0129  Weight: 91 kg    Examination:  Constitutional: He is in no distress Eyes: no icterus ENMT: mmm  Neck: normal, supple Respiratory: Clear bilaterally without wheezing or crackles Cardiovascular: Regular rate and rhythm, no murmurs, no edema Abdomen: Nondistended, bowel sounds positive Musculoskeletal: no clubbing /left fifth toe necrosis.  Skin: No rashes Neurologic:  Nonfocal.    Data Reviewed: I have independently reviewed following labs and imaging studies   CBC: Recent Labs  Lab 05/15/21 1853 05/16/21 0310  WBC 10.7* 8.3  NEUTROABS 7.8*  --   HGB 13.2 12.4*  HCT 40.2 37.8*  MCV 89.1 88.3  PLT 211 161    Basic Metabolic Panel: Recent Labs  Lab 05/15/21 1853 05/16/21 0310 05/17/21 0129  NA 134* 134*  --   K 3.6 3.5  --   CL 95* 95*  --   CO2 28 31  --   GLUCOSE 308* 354*  --   BUN 25* 21  --   CREATININE 1.65* 1.59* 1.38*  CALCIUM 8.9 8.9  --     Liver Function Tests: Recent Labs  Lab 05/15/21 1853  AST 16  ALT 23  ALKPHOS 42  BILITOT 1.1  PROT 7.9  ALBUMIN 3.9    Coagulation Profile: Recent Labs  Lab 05/15/21 2323 05/16/21 0310 05/17/21 0809  INR 5.1* 4.9* 4.0*    HbA1C: No results for input(s): HGBA1C in the last 72 hours. CBG: Recent Labs  Lab 05/16/21 1706 05/16/21 2004 05/17/21 0001 05/17/21 0425 05/17/21 0846  GLUCAP 319* 257* 170* 163* 203*     Recent Results (from the past 240 hour(s))  SARS CORONAVIRUS 2 (TAT 6-24 HRS) Nasopharyngeal Nasopharyngeal Swab     Status: None   Collection Time: 05/15/21 11:23 PM   Specimen: Nasopharyngeal Swab  Result Value Ref Range Status   SARS Coronavirus 2 NEGATIVE NEGATIVE Final    Comment: (NOTE) SARS-CoV-2 target nucleic acids are NOT DETECTED.  The SARS-CoV-2 RNA is generally detectable in upper and lower respiratory specimens during the acute phase of infection. Negative results do not preclude SARS-CoV-2 infection, do not rule out co-infections with other pathogens, and should not be used as the sole basis for treatment or other patient management decisions. Negative results must be combined with clinical observations, patient history, and epidemiological information. The expected result is Negative.  Fact Sheet for Patients: HairSlick.no  Fact Sheet for Healthcare  Providers: quierodirigir.com  This test is not yet approved or cleared by the [place] FDA and  has been authorized for detection and/or diagnosis of SARS-CoV-2 by FDA under an Emergency Use Authorization (EUA). This EUA will remain  in effect (meaning this test can be used) for the duration of the COVID-19 declaration under Se ction 564(b)(1) of the Act, 21 U.S.C. section 360bbb-3(b)(1), unless the authorization is terminated or revoked sooner.  Performed at [facility], [address] [place], [place] [address]   Culture, blood (routine x 2)     Status: None (Preliminary result)   Collection Time: 05/16/21  3:10 AM   Specimen: BLOOD  Result Value Ref Range Status   Specimen Description BLOOD SITE NOT SPECIFIED  Final   Special Requests   Final    BOTTLES  DRAWN AEROBIC AND ANAEROBIC Blood Culture adequate volume   Culture   Final    NO GROWTH 1 DAY Performed at [facility], [address], [place], [place] [address]    Report Status PENDING  Incomplete  Culture, blood (routine x 2)     Status: None (Preliminary result)   Collection Time: 05/16/21  3:10 AM   Specimen: BLOOD  Result Value Ref Range Status   Specimen Description BLOOD SITE NOT SPECIFIED  Final   Special Requests   Final    BOTTLES DRAWN AEROBIC AND ANAEROBIC Blood Culture adequate volume   Culture   Final    NO GROWTH 1 DAY Performed at [facility], [address] [place], [place] [address]    Report Status PENDING  Incomplete      Radiology Studies: No results found.   Pamella Pert, MD, PhD [facility]  Between 7 am - 7 pm I am available, please contact me via Amion (for emergencies) or Securechat (non urgent messages)  Between 7 pm - 7 am I am not available, please contact night coverage MD/APP via Amion

## 2021-05-17 NOTE — Plan of Care (Signed)
  Problem: Education: Goal: Ability to describe self-care measures that may prevent or decrease complications (Diabetes Survival Skills Education) will improve Outcome: Progressing   Problem: Fluid Volume: Goal: Ability to maintain a balanced intake and output will improve Outcome: Progressing   Problem: Metabolic: Goal: Ability to maintain appropriate glucose levels will improve Outcome: Progressing   Problem: Tissue Perfusion: Goal: Adequacy of tissue perfusion will improve Outcome: Progressing   

## 2021-05-17 NOTE — Progress Notes (Signed)
ANTICOAGULATION CONSULT NOTE - Initial Consult  Pharmacy Consult for Heparin (while PTA warfarin on hold) Indication: atrial fibrillation  No Known Allergies  Patient Measurements: Height: 5\' 8"  (172.7 cm) Weight: 91 kg (200 lb 9.9 oz) IBW/kg (Calculated) : 68.4 kg Heparin Dosing Weight: 87.2 kg  Vital Signs: Temp: 97.6 F (36.4 C) (07/08 2009) Temp Source: Oral (07/08 2009) BP: 143/68 (07/08 2009) Pulse Rate: 60 (07/08 2009)  Labs: Recent Labs    05/15/21 1853 05/15/21 2323 05/16/21 0310 05/17/21 0129  HGB 13.2  --  12.4*  --   HCT 40.2  --  37.8*  --   PLT 211  --  161  --   APTT  --   --  68*  --   LABPROT  --  46.9* 45.8*  --   INR  --  5.1* 4.9*  --   CREATININE 1.65*  --  1.59* 1.38*    Estimated Creatinine Clearance: 54.5 mL/min (A) (by C-G formula based on SCr of 1.38 mg/dL (H)).   Medical History: Past Medical History:  Diagnosis Date   A-fib [facility])    a. (06/04/14) TEE-DC-CV; succesful; large LA 6.2 cm   A-fib (HCC)    Ascites    CHF (congestive heart failure) (HCC)    Chronic systolic heart failure (HCC)    a. ECHO (05/2014): EF 25-30%, diff HK, RV midly dilated and sys fx mildly/mod reduced   CKD (chronic kidney disease)    CKD (chronic kidney disease) stage 3, GFR 30-59 ml/min (HCC)    Depression    Diabetes mellitus without complication (HCC)    HTN (hypertension)    Hypertension    OSA (obstructive sleep apnea)    Type 2 diabetes mellitus (HCC)     Assessment: 70 yo male with a history of LV thrombus presents with a necrotic L toe. PTA the patient is on warfarin. INR upon admission of 5.1. Pharmacy is consulted to dose heparin, plan to stop heparin 6 hours prior to the arteriogram (schedule to start on 7/11 at 1000).  Warfarin PTA dose: 6 mg daily except 3 mg on Tues/Sat  Goal of Therapy:  Heparin level 0.3-0.7 units/ml Monitor platelets by anticoagulation protocol: Yes   Plan:  - HOLD on starting heparin until INR <2 per Dr. 9/11 -  Obtain a daily CBC and PT/INR - Monitor for signs and symptoms of bleeding   Elvera Lennox, PharmD, RPh  PGY-1 Pharmacy Resident 05/17/2021 7:52 AM  Please check [email or web address] for unit-specific pharmacy phone numbers.

## 2021-05-17 NOTE — Progress Notes (Addendum)
PHARMACY NOTE:  ANTIMICROBIAL RENAL DOSAGE ADJUSTMENT  Current antimicrobial regimen includes a mismatch between antimicrobial dosage and estimated renal function.  As per policy approved by the Pharmacy & Therapeutics and Medical Executive Committees, the antimicrobial dosage will be adjusted accordingly.  Current antimicrobial dosage: Vancomycin IV 1250 mg q24h (estimated AUC 618)  Indication: Cellulitis   Renal Function: SCr upon admission was 1.65, now back around baseline of 1.38  Estimated Creatinine Clearance: 54.5 mL/min (A) (by C-G formula based on SCr of 1.38 mg/dL (H)).    Antimicrobial dosage has been changed to: Vancomycin 1000 mg IV q24h (estimated AUC 495, goal 400-550)   Thank you for allowing pharmacy to be a part of this patient's care.  Sanda Klein, PharmD, RPh  PGY-1 Pharmacy Resident 05/17/2021 8:23 AM  Please check [email or web address] for unit-specific pharmacy phone numbers.

## 2021-05-18 DIAGNOSIS — N179 Acute kidney failure, unspecified: Secondary | ICD-10-CM | POA: Diagnosis not present

## 2021-05-18 DIAGNOSIS — I96 Gangrene, not elsewhere classified: Secondary | ICD-10-CM | POA: Diagnosis not present

## 2021-05-18 DIAGNOSIS — I5042 Chronic combined systolic (congestive) and diastolic (congestive) heart failure: Secondary | ICD-10-CM | POA: Diagnosis not present

## 2021-05-18 DIAGNOSIS — I10 Essential (primary) hypertension: Secondary | ICD-10-CM | POA: Diagnosis not present

## 2021-05-18 LAB — BASIC METABOLIC PANEL
Anion gap: 11 (ref 5–15)
BUN: 18 mg/dL (ref 8–23)
CO2: 22 mmol/L (ref 22–32)
Calcium: 8.7 mg/dL — ABNORMAL LOW (ref 8.9–10.3)
Chloride: 101 mmol/L (ref 98–111)
Creatinine, Ser: 1.26 mg/dL — ABNORMAL HIGH (ref 0.61–1.24)
GFR, Estimated: >60 mL/min (ref >60)
Glucose, Bld: 212 mg/dL — ABNORMAL HIGH (ref 70–99)
Potassium: 3.3 mmol/L — ABNORMAL LOW (ref 3.5–5.1)
Sodium: 134 mmol/L — ABNORMAL LOW (ref 135–145)

## 2021-05-18 LAB — MRSA NEXT GEN BY PCR, NASAL: MRSA by PCR Next Gen: NOT DETECTED

## 2021-05-18 LAB — CBC
HCT: 36.3 % — ABNORMAL LOW (ref 39.0–52.0)
Hemoglobin: 12.1 g/dL — ABNORMAL LOW (ref 13.0–17.0)
MCH: 29.2 pg (ref 26.0–34.0)
MCHC: 33.3 g/dL (ref 30.0–36.0)
MCV: 87.7 fL (ref 80.0–100.0)
Platelets: 148 10*3/uL — ABNORMAL LOW (ref 150–400)
RBC: 4.14 MIL/uL — ABNORMAL LOW (ref 4.22–5.81)
RDW: 13.6 % (ref 11.5–15.5)
WBC: 9.4 10*3/uL (ref 4.0–10.5)
nRBC: 0 % (ref 0.0–0.2)

## 2021-05-18 LAB — GLUCOSE, CAPILLARY
Glucose-Capillary: 169 mg/dL — ABNORMAL HIGH (ref 70–99)
Glucose-Capillary: 186 mg/dL — ABNORMAL HIGH (ref 70–99)
Glucose-Capillary: 198 mg/dL — ABNORMAL HIGH (ref 70–99)
Glucose-Capillary: 202 mg/dL — ABNORMAL HIGH (ref 70–99)
Glucose-Capillary: 216 mg/dL — ABNORMAL HIGH (ref 70–99)
Glucose-Capillary: 266 mg/dL — ABNORMAL HIGH (ref 70–99)

## 2021-05-18 LAB — PROTIME-INR
INR: 2.6 — ABNORMAL HIGH (ref 0.8–1.2)
Prothrombin Time: 27.5 seconds — ABNORMAL HIGH (ref 11.4–15.2)

## 2021-05-18 MED ORDER — HYDROXYZINE HCL 10 MG PO TABS
10.0000 mg | ORAL_TABLET | Freq: Three times a day (TID) | ORAL | Status: DC | PRN
  Administered 2021-05-18 (×2): 10 mg via ORAL
  Filled 2021-05-18 (×3): qty 1

## 2021-05-18 MED ORDER — INSULIN ASPART 100 UNIT/ML IJ SOLN
0.0000 [IU] | Freq: Three times a day (TID) | INTRAMUSCULAR | Status: DC
  Administered 2021-05-18 (×2): 3 [IU] via SUBCUTANEOUS
  Administered 2021-05-19: 2 [IU] via SUBCUTANEOUS
  Administered 2021-05-19 – 2021-05-20 (×2): 3 [IU] via SUBCUTANEOUS
  Administered 2021-05-20: 8 [IU] via SUBCUTANEOUS
  Administered 2021-05-21: 3 [IU] via SUBCUTANEOUS
  Administered 2021-05-21: 5 [IU] via SUBCUTANEOUS
  Administered 2021-05-21: 3 [IU] via SUBCUTANEOUS
  Administered 2021-05-23: 5 [IU] via SUBCUTANEOUS
  Administered 2021-05-23: 3 [IU] via SUBCUTANEOUS
  Administered 2021-05-24: 5 [IU] via SUBCUTANEOUS
  Administered 2021-05-24 (×2): 3 [IU] via SUBCUTANEOUS
  Administered 2021-05-25: 5 [IU] via SUBCUTANEOUS
  Administered 2021-05-25 – 2021-05-26 (×5): 3 [IU] via SUBCUTANEOUS
  Administered 2021-05-27: 2 [IU] via SUBCUTANEOUS
  Administered 2021-05-27 – 2021-05-29 (×6): 3 [IU] via SUBCUTANEOUS
  Administered 2021-05-29: 2 [IU] via SUBCUTANEOUS

## 2021-05-18 MED ORDER — POTASSIUM CHLORIDE CRYS ER 20 MEQ PO TBCR
40.0000 meq | EXTENDED_RELEASE_TABLET | Freq: Once | ORAL | Status: AC
  Administered 2021-05-18: 40 meq via ORAL
  Filled 2021-05-18: qty 2

## 2021-05-18 NOTE — Progress Notes (Signed)
PROGRESS NOTE  [name] DGU:[id number] DOB: 1951-06-11 DOA: 05/15/2021 PCP: Patient, No Pcp Per (Inactive)   LOS: 3 days   Brief Narrative / Interim history: 70 year old male with history of chronic combined CHF, HTN, PAF on Coumadin, LV thrombus, CKD 3A, DM2 comes into the hospital with pain in the left pinky toe as well as the toe has been turning black.  He was found to have necrosis with maggots, was febrile to 100.3 and admitted to the hospital.  Orthopedic surgery consulted  Subjective / 24h Interval events: No complaints this morning, no chest pain, no shortness of breath.  Assessment & Plan: Principal Problem Necrotic left fifth toe-orthopedic and vascular surgery consulted, appreciate input.   -Patient will undergo arteriogram on Monday -Monitor blood cultures, they were obtained after initial antibiotics. -Continue broad-spectrum antibiotics, will check MRSA PCR and if negative can probably discontinue vancomycin  Active Problems Acute kidney injury on chronic kidney disease stage IIIa-Baseline creatinine 1.2-1.3, currently at baseline  Chronic combined systolic and diastolic CHF-Echo in 2017 showed EF 15 to 20%, has recovered in August 28 2154-60% and grade 2 DD. -Hold losartan, placed back on the Lasix  Essential hypertension-he is normotensive, continue metoprolol and Lasix  PAF-on metoprolol, continue to hold Coumadin for probable surgery.  INR this morning is 2.6, start heparin when less than 2  History of LV thrombus-most recent echo was negative for thrombus  DM2-continue sliding scale, CBGs acceptable  CBG (last 3)  Recent Labs    05/18/21 0010 05/18/21 0432 05/18/21 0724  GLUCAP 216* 266* 186*    Check INR.  Continue metoprolol.  Scheduled Meds:  furosemide  40 mg Oral BID   insulin aspart  0-15 Units Subcutaneous TID WC   metoprolol succinate  100 mg Oral BID   Continuous Infusions:  ceFEPime (MAXIPIME) IV 2 g (05/18/21 0049)    metronidazole 500 mg (05/18/21 9563)   vancomycin 1,000 mg (05/18/21 1017)   PRN Meds:.acetaminophen **OR** acetaminophen, hydrOXYzine, ondansetron (ZOFRAN) IV  Diet Orders (From admission, onward)     Start     Ordered   05/19/21 0001  Diet NPO time specified  Diet effective midnight        05/18/21 1010   05/16/21 1252  Diet Carb Modified Fluid consistency: Thin; Room service appropriate? Yes  Diet effective now       Question Answer Comment  Diet-HS Snack? Nothing   Calorie Level Medium 1600-2000   Fluid consistency: Thin   Room service appropriate? Yes      05/16/21 1251            DVT prophylaxis: SCDs Start: 05/16/21 0041     Code Status: Full Code  Family Communication: No family at bedside  Status is: Inpatient  Remains inpatient appropriate because:Inpatient level of care appropriate due to severity of illness  Dispo: The patient is from: Home              Anticipated d/c is to: Home              Patient currently is not medically stable to d/c.   Difficult to place patient No   Level of care: Med-Surg  Consultants:  Orthopedic surgery  Procedures:  None   Microbiology  Blood cultures   Antimicrobials: Vanc / Cefepime / Metronidazole 7/7 >>    Objective: Vitals:   05/17/21 2129 05/18/21 0434 05/18/21 0700 05/18/21 0807  BP:  (!) 145/86 130/74 137/80  Pulse: 95 70 (!) 41 76  Resp:  17 18   Temp:  98.3 F (36.8 C) 97.6 F (36.4 C)   TempSrc:  Oral Oral   SpO2:  97% 98% 97%  Weight:      Height:        Intake/Output Summary (Last 24 hours) at 05/18/2021 1113 Last data filed at 05/18/2021 0543 Gross per 24 hour  Intake --  Output 1900 ml  Net -1900 ml    Filed Weights   05/16/21 0129  Weight: 91 kg    Examination:  Constitutional: NAD Eyes: No icterus ENMT: Moist mucous membranes Neck: normal, supple Respiratory: Lungs are clear bilaterally, no wheezing Cardiovascular: Regular rate and rhythm, no murmurs Abdomen: Soft,  NT, ND, bowel sounds positive Musculoskeletal: no clubbing /left fifth toe necrosis.  Skin: No rashes seen Neurologic: No focal deficits.    Data Reviewed: I have independently reviewed following labs and imaging studies   CBC: Recent Labs  Lab 05/15/21 1853 05/16/21 0310 05/18/21 0406  WBC 10.7* 8.3 9.4  NEUTROABS 7.8*  --   --   HGB 13.2 12.4* 12.1*  HCT 40.2 37.8* 36.3*  MCV 89.1 88.3 87.7  PLT 211 161 148*    Basic Metabolic Panel: Recent Labs  Lab 05/15/21 1853 05/16/21 0310 05/17/21 0129 05/18/21 0406  NA 134* 134*  --  134*  K 3.6 3.5  --  3.3*  CL 95* 95*  --  101  CO2 28 31  --  22  GLUCOSE 308* 354*  --  212*  BUN 25* 21  --  18  CREATININE 1.65* 1.59* 1.38* 1.26*  CALCIUM 8.9 8.9  --  8.7*    Liver Function Tests: Recent Labs  Lab 05/15/21 1853  AST 16  ALT 23  ALKPHOS 42  BILITOT 1.1  PROT 7.9  ALBUMIN 3.9    Coagulation Profile: Recent Labs  Lab 05/15/21 2323 05/16/21 0310 05/17/21 0809 05/18/21 0406  INR 5.1* 4.9* 4.0* 2.6*    HbA1C: No results for input(s): HGBA1C in the last 72 hours. CBG: Recent Labs  Lab 05/17/21 1533 05/17/21 2031 05/18/21 0010 05/18/21 0432 05/18/21 0724  GLUCAP 235* 197* 216* 266* 186*     Recent Results (from the past 240 hour(s))  SARS CORONAVIRUS 2 (TAT 6-24 HRS) Nasopharyngeal Nasopharyngeal Swab     Status: None   Collection Time: 05/15/21 11:23 PM   Specimen: Nasopharyngeal Swab  Result Value Ref Range Status   SARS Coronavirus 2 NEGATIVE NEGATIVE Final    Comment: (NOTE) SARS-CoV-2 target nucleic acids are NOT DETECTED.  The SARS-CoV-2 RNA is generally detectable in upper and lower respiratory specimens during the acute phase of infection. Negative results do not preclude SARS-CoV-2 infection, do not rule out co-infections with other pathogens, and should not be used as the sole basis for treatment or other patient management decisions. Negative results must be combined with clinical  observations, patient history, and epidemiological information. The expected result is Negative.  Fact Sheet for Patients: HairSlick.no  Fact Sheet for Healthcare Providers: quierodirigir.com  This test is not yet approved or cleared by the [place] FDA and  has been authorized for detection and/or diagnosis of SARS-CoV-2 by FDA under an Emergency Use Authorization (EUA). This EUA will remain  in effect (meaning this test can be used) for the duration of the COVID-19 declaration under Se ction 564(b)(1) of the Act, 21 U.S.C. section 360bbb-3(b)(1), unless the authorization is terminated or revoked sooner.  Performed at [facility], [address] [place], [place] [address]   Culture, blood (routine x 2)     Status: None (Preliminary result)   Collection Time: 05/16/21  3:10 AM   Specimen: BLOOD  Result Value Ref Range Status   Specimen Description BLOOD SITE NOT SPECIFIED  Final   Special Requests   Final    BOTTLES DRAWN AEROBIC AND ANAEROBIC Blood Culture adequate volume   Culture   Final    NO GROWTH 2 DAYS Performed at [facility], [address], [place], [place] [address]    Report Status PENDING  Incomplete  Culture, blood (routine x 2)     Status: None (Preliminary result)   Collection Time: 05/16/21  3:10 AM   Specimen: BLOOD  Result Value Ref Range Status   Specimen Description BLOOD SITE NOT SPECIFIED  Final   Special Requests   Final    BOTTLES DRAWN AEROBIC AND ANAEROBIC Blood Culture adequate volume   Culture   Final    NO GROWTH 2 DAYS Performed at [facility], [address], [place], [place] [address]    Report Status PENDING  Incomplete      Radiology Studies: No results found.   Pamella Pert, MD, PhD [facility]  Between 7 am - 7 pm I am available, please contact me via Amion (for emergencies) or Securechat (non urgent messages)  Between 7 pm - 7 am I am  not available, please contact night coverage MD/APP via Amion

## 2021-05-18 NOTE — Progress Notes (Addendum)
  Progress Note    05/18/2021 10:07 AM * No surgery date entered *  Subjective:  Not sleeping o/w no complaints   Vitals:   05/18/21 0700 05/18/21 0807  BP: 130/74 137/80  Pulse: (!) 41 76  Resp: 18   Temp: 97.6 F (36.4 C)   SpO2: 98% 97%    Physical Exam: General appearance: Awake, alert in no apparent distress Cardiac: Heart rate and rhythm are regular Respirations: Nonlabored Extremities: Left 5th toe ischemic changes noted and unchanged.  CBC    Component Value Date/Time   WBC 9.4 05/18/2021 0406   RBC 4.14 (L) 05/18/2021 0406   HGB 12.1 (L) 05/18/2021 0406   HCT 36.3 (L) 05/18/2021 0406   PLT 148 (L) 05/18/2021 0406   MCV 87.7 05/18/2021 0406   MCH 29.2 05/18/2021 0406   MCHC 33.3 05/18/2021 0406   RDW 13.6 05/18/2021 0406   LYMPHSABS 1.7 05/15/2021 1853   MONOABS 0.9 05/15/2021 1853   EOSABS 0.1 05/15/2021 1853   BASOSABS 0.0 05/15/2021 1853    BMET    Component Value Date/Time   NA 134 (L) 05/18/2021 0406   K 3.3 (L) 05/18/2021 0406   CL 101 05/18/2021 0406   CO2 22 05/18/2021 0406   GLUCOSE 212 (H) 05/18/2021 0406   BUN 18 05/18/2021 0406   CREATININE 1.26 (H) 05/18/2021 0406   CALCIUM 8.7 (L) 05/18/2021 0406   GFRNONAA >60 05/18/2021 0406   GFRAA >60 08/06/2020 1418     Intake/Output Summary (Last 24 hours) at 05/18/2021 1007 Last data filed at 05/18/2021 0543 Gross per 24 hour  Intake --  Output 2100 ml  Net -2100 ml    HOSPITAL MEDICATIONS Scheduled Meds:  furosemide  40 mg Oral BID   insulin aspart  0-15 Units Subcutaneous TID WC   metoprolol succinate  100 mg Oral BID   Continuous Infusions:  ceFEPime (MAXIPIME) IV 2 g (05/18/21 0049)   metronidazole 500 mg (05/18/21 9528)   vancomycin 1,000 mg (05/17/21 1043)   PRN Meds:.acetaminophen **OR** acetaminophen, ondansetron (ZOFRAN) IV  Assessment and Plan: Atherosclerosis of native arteries of left lower extremity causing gangrene. Plan aortogram, left lower extremity angiogram  with possible intervention via right common femoral artery in cath lab 05/19/21 with Dr. Chestine Spore. Questions answered and he agrees with plan. NPO after MN   Wendi Maya, PA-C [facility] [phone] 05/18/2021  10:07 AM   VASCULAR STAFF ADDENDUM: I have independently interviewed and examined the patient. I agree with the above.   Rande Brunt. Lenell Antu, MD [facility] Phone Number: [phone] 05/18/2021 10:36 AM

## 2021-05-19 ENCOUNTER — Inpatient Hospital Stay (HOSPITAL_COMMUNITY)
Admission: EM | Disposition: A | Discharge status: Skilled Nursing Facility | Payer: Self-pay | Source: Home / Self Care | Attending: Internal Medicine

## 2021-05-19 DIAGNOSIS — I96 Gangrene, not elsewhere classified: Secondary | ICD-10-CM | POA: Diagnosis not present

## 2021-05-19 DIAGNOSIS — I70262 Atherosclerosis of native arteries of extremities with gangrene, left leg: Secondary | ICD-10-CM

## 2021-05-19 DIAGNOSIS — N179 Acute kidney failure, unspecified: Secondary | ICD-10-CM | POA: Diagnosis not present

## 2021-05-19 DIAGNOSIS — I5042 Chronic combined systolic (congestive) and diastolic (congestive) heart failure: Secondary | ICD-10-CM | POA: Diagnosis not present

## 2021-05-19 DIAGNOSIS — I10 Essential (primary) hypertension: Secondary | ICD-10-CM | POA: Diagnosis not present

## 2021-05-19 HISTORY — PX: ABDOMINAL AORTOGRAM W/LOWER EXTREMITY: CATH118223

## 2021-05-19 HISTORY — PX: PERIPHERAL VASCULAR INTERVENTION: CATH118257

## 2021-05-19 LAB — GLUCOSE, CAPILLARY
Glucose-Capillary: 123 mg/dL — ABNORMAL HIGH (ref 70–99)
Glucose-Capillary: 144 mg/dL — ABNORMAL HIGH (ref 70–99)
Glucose-Capillary: 193 mg/dL — ABNORMAL HIGH (ref 70–99)
Glucose-Capillary: 200 mg/dL — ABNORMAL HIGH (ref 70–99)
Glucose-Capillary: 208 mg/dL — ABNORMAL HIGH (ref 70–99)
Glucose-Capillary: 210 mg/dL — ABNORMAL HIGH (ref 70–99)

## 2021-05-19 LAB — CBC
HCT: 37.2 % — ABNORMAL LOW (ref 39.0–52.0)
Hemoglobin: 12.5 g/dL — ABNORMAL LOW (ref 13.0–17.0)
MCH: 29.2 pg (ref 26.0–34.0)
MCHC: 33.6 g/dL (ref 30.0–36.0)
MCV: 86.9 fL (ref 80.0–100.0)
Platelets: 174 10*3/uL (ref 150–400)
RBC: 4.28 MIL/uL (ref 4.22–5.81)
RDW: 13.6 % (ref 11.5–15.5)
WBC: 9.7 10*3/uL (ref 4.0–10.5)
nRBC: 0 % (ref 0.0–0.2)

## 2021-05-19 LAB — BASIC METABOLIC PANEL
Anion gap: 7 (ref 5–15)
BUN: 15 mg/dL (ref 8–23)
CO2: 27 mmol/L (ref 22–32)
Calcium: 8.7 mg/dL — ABNORMAL LOW (ref 8.9–10.3)
Chloride: 102 mmol/L (ref 98–111)
Creatinine, Ser: 1.31 mg/dL — ABNORMAL HIGH (ref 0.61–1.24)
GFR, Estimated: 59 mL/min — ABNORMAL LOW (ref >60)
Glucose, Bld: 199 mg/dL — ABNORMAL HIGH (ref 70–99)
Potassium: 3.6 mmol/L (ref 3.5–5.1)
Sodium: 136 mmol/L (ref 135–145)

## 2021-05-19 LAB — PROTIME-INR
INR: 1.8 — ABNORMAL HIGH (ref 0.8–1.2)
Prothrombin Time: 20.6 seconds — ABNORMAL HIGH (ref 11.4–15.2)

## 2021-05-19 LAB — POCT ACTIVATED CLOTTING TIME: Activated Clotting Time: 260 seconds

## 2021-05-19 LAB — SURGICAL PCR SCREEN
MRSA, PCR: NEGATIVE
Staphylococcus aureus: NEGATIVE

## 2021-05-19 SURGERY — ABDOMINAL AORTOGRAM W/LOWER EXTREMITY
Anesthesia: LOCAL

## 2021-05-19 MED ORDER — MIDAZOLAM HCL 2 MG/2ML IJ SOLN
INTRAMUSCULAR | Status: AC
  Filled 2021-05-19: qty 2

## 2021-05-19 MED ORDER — FENTANYL CITRATE (PF) 100 MCG/2ML IJ SOLN
INTRAMUSCULAR | Status: DC | PRN
  Administered 2021-05-19: 50 ug via INTRAVENOUS

## 2021-05-19 MED ORDER — CLOPIDOGREL BISULFATE 300 MG PO TABS
ORAL_TABLET | ORAL | Status: DC | PRN
  Administered 2021-05-19: 300 mg via ORAL

## 2021-05-19 MED ORDER — ATORVASTATIN CALCIUM 10 MG PO TABS
10.0000 mg | ORAL_TABLET | Freq: Every day | ORAL | Status: DC
  Administered 2021-05-20 – 2021-05-29 (×10): 10 mg via ORAL
  Filled 2021-05-19 (×11): qty 1

## 2021-05-19 MED ORDER — HEPARIN SODIUM (PORCINE) 1000 UNIT/ML IJ SOLN
INTRAMUSCULAR | Status: AC
  Filled 2021-05-19: qty 1

## 2021-05-19 MED ORDER — SODIUM CHLORIDE 0.9 % IV SOLN: INTRAVENOUS | Status: DC

## 2021-05-19 MED ORDER — CLOPIDOGREL BISULFATE 75 MG PO TABS
75.0000 mg | ORAL_TABLET | Freq: Every day | ORAL | Status: DC
  Administered 2021-05-20 – 2021-05-29 (×10): 75 mg via ORAL
  Filled 2021-05-19 (×10): qty 1

## 2021-05-19 MED ORDER — HEPARIN (PORCINE) IN NACL 1000-0.9 UT/500ML-% IV SOLN
INTRAVENOUS | Status: DC | PRN
  Administered 2021-05-19 (×2): 500 mL

## 2021-05-19 MED ORDER — FENTANYL CITRATE (PF) 100 MCG/2ML IJ SOLN
INTRAMUSCULAR | Status: AC
  Filled 2021-05-19: qty 2

## 2021-05-19 MED ORDER — CLOPIDOGREL BISULFATE 300 MG PO TABS
ORAL_TABLET | ORAL | Status: AC
  Filled 2021-05-19: qty 1

## 2021-05-19 MED ORDER — HEPARIN SODIUM (PORCINE) 1000 UNIT/ML IJ SOLN
INTRAMUSCULAR | Status: DC | PRN
  Administered 2021-05-19: 9000 [IU] via INTRAVENOUS

## 2021-05-19 MED ORDER — SODIUM CHLORIDE 0.9% FLUSH
3.0000 mL | INTRAVENOUS | Status: DC | PRN
  Administered 2021-05-26 – 2021-05-27 (×2): 3 mL via INTRAVENOUS

## 2021-05-19 MED ORDER — LABETALOL HCL 5 MG/ML IV SOLN: 10.0000 mg | INTRAVENOUS | Status: DC | PRN

## 2021-05-19 MED ORDER — SODIUM CHLORIDE 0.9% FLUSH
3.0000 mL | Freq: Two times a day (BID) | INTRAVENOUS | Status: DC
  Administered 2021-05-19 – 2021-05-29 (×14): 3 mL via INTRAVENOUS

## 2021-05-19 MED ORDER — IODIXANOL 320 MG/ML IV SOLN
INTRAVENOUS | Status: DC | PRN
  Administered 2021-05-19: 160 mL via INTRA_ARTERIAL

## 2021-05-19 MED ORDER — SODIUM CHLORIDE 0.9 % IV SOLN: 250.0000 mL | INTRAVENOUS | Status: DC | PRN

## 2021-05-19 MED ORDER — MIDAZOLAM HCL 2 MG/2ML IJ SOLN
INTRAMUSCULAR | Status: DC | PRN
  Administered 2021-05-19: 1 mg via INTRAVENOUS

## 2021-05-19 MED ORDER — CLOPIDOGREL BISULFATE 300 MG PO TABS
300.0000 mg | ORAL_TABLET | Freq: Once | ORAL | Status: AC
  Administered 2021-05-19: 300 mg via ORAL
  Filled 2021-05-19: qty 1

## 2021-05-19 MED ORDER — SODIUM CHLORIDE 0.9 % IV SOLN: INTRAVENOUS | Status: AC

## 2021-05-19 MED ORDER — HYDRALAZINE HCL 20 MG/ML IJ SOLN
5.0000 mg | INTRAMUSCULAR | Status: DC | PRN
  Administered 2021-05-28: 5 mg via INTRAVENOUS
  Filled 2021-05-19 (×2): qty 1

## 2021-05-19 MED ORDER — HEPARIN (PORCINE) IN NACL 1000-0.9 UT/500ML-% IV SOLN
INTRAVENOUS | Status: AC
  Filled 2021-05-19: qty 1000

## 2021-05-19 MED ORDER — LIDOCAINE HCL (PF) 1 % IJ SOLN
INTRAMUSCULAR | Status: AC
  Filled 2021-05-19: qty 30

## 2021-05-19 MED ORDER — ACETAMINOPHEN 325 MG PO TABS
650.0000 mg | ORAL_TABLET | ORAL | Status: DC | PRN
  Administered 2021-05-22: 650 mg via ORAL
  Administered 2021-05-23: 325 mg via ORAL

## 2021-05-19 MED ORDER — ONDANSETRON HCL 4 MG/2ML IJ SOLN: 4.0000 mg | Freq: Four times a day (QID) | INTRAMUSCULAR | Status: DC | PRN

## 2021-05-19 MED ORDER — HEPARIN (PORCINE) 25000 UT/250ML-% IV SOLN
1700.0000 [IU]/h | INTRAVENOUS | Status: DC
  Administered 2021-05-19: 1300 [IU]/h via INTRAVENOUS
  Administered 2021-05-20 – 2021-05-22 (×4): 1700 [IU]/h via INTRAVENOUS
  Filled 2021-05-19 (×6): qty 250

## 2021-05-19 SURGICAL SUPPLY — 18 items
BALLN STERLING OTW 3X40X150 (BALLOONS) ×3
CATH OMNI FLUSH 5F 65CM (CATHETERS) ×3
CATH TEMPO AQUA 5F 100CM (CATHETERS) ×3
DEVICE CLOSURE MYNXGRIP 5F (Vascular Products) ×3 IMPLANT
GUIDEWIRE ANGLED .035X150CM (WIRE) ×3
KIT ENCORE 26 ADVANTAGE (KITS) ×3
KIT MICROPUNCTURE NIT STIFF (SHEATH) ×3
KIT PV (KITS) ×3
MAT PREVALON FULL STRYKER (MISCELLANEOUS) ×3
SHEATH FLEX ANSEL ANG 5F 45CM (SHEATH) ×3
SHEATH PINNACLE 5F 10CM (SHEATH) ×3
SHEATH PROBE COVER 6X72 (BAG) ×3
SYR MEDRAD MARK V 150ML (SYRINGE) ×3
TRANSDUCER W/STOPCOCK (MISCELLANEOUS) ×3
TRAY PV CATH (CUSTOM PROCEDURE TRAY) ×3
WIRE BENTSON .035X145CM (WIRE) ×6
WIRE G V18X300CM (WIRE) ×3
WIRE ROSEN-J .035X180CM (WIRE) ×3

## 2021-05-19 NOTE — Progress Notes (Signed)
[facility] [place]  Subjective  - INR 1.8   Objective (!) 146/84 84 98.2 F (36.8 C) (Oral) 17 96%  Intake/Output Summary (Last 24 hours) at 05/19/2021 1041 Last data filed at 05/19/2021 0800 Gross per 24 hour  Intake 120 ml  Output 700 ml  Net -580 ml    Left 5th toe ischemic changes  Laboratory Lab Results: Recent Labs    05/18/21 0406 05/19/21 0850  WBC 9.4 9.7  HGB 12.1* 12.5*  HCT 36.3* 37.2*  PLT 148* 174   BMET Recent Labs    05/18/21 0406 05/19/21 0850  NA 134* 136  K 3.3* 3.6  CL 101 102  CO2 22 27  GLUCOSE 212* 199*  BUN 18 15  CREATININE 1.26* 1.31*  CALCIUM 8.7* 8.7*    COAG Lab Results  Component Value Date   INR 1.8 (H) 05/19/2021   INR 2.6 (H) 05/18/2021   INR 4.0 (H) 05/17/2021   No results found for: PTT  Assessment/Planning:  Plan aortogram, left lower extremity arteriogram, possible intervention.  INR 1.8.    [name] 05/19/2021 10:41 AM --

## 2021-05-19 NOTE — Plan of Care (Signed)
  Problem: Education: Goal: Ability to describe self-care measures that may prevent or decrease complications (Diabetes Survival Skills Education) will improve Outcome: Progressing   Problem: Coping: Goal: Ability to adjust to condition or change in health will improve Outcome: Progressing   Problem: Health Behavior/Discharge Planning: Goal: Ability to identify and utilize available resources and services will improve Outcome: Progressing   Problem: Skin Integrity: Goal: Risk for impaired skin integrity will decrease Outcome: Progressing   

## 2021-05-19 NOTE — Progress Notes (Signed)
PROGRESS NOTE  [name] BTD:[id number] DOB: 06-Jul-1951 DOA: 05/15/2021 PCP: Patient, No Pcp Per (Inactive)   LOS: 4 days   Brief Narrative / Interim history: 70 year old male with history of chronic combined CHF, HTN, PAF on Coumadin, LV thrombus, CKD 3A, DM2 comes into the hospital with pain in the left pinky toe as well as the toe has been turning black.  He was found to have necrosis with maggots, was febrile to 100.3 and admitted to the hospital.  Orthopedic surgery consulted  Subjective / 24h Interval events: No complaints this morning, no chest pain, no shortness of breath.  Awaiting procedure  Assessment & Plan: Principal Problem Necrotic left fifth toe-orthopedic and vascular surgery consulted, appreciate input.  He underwent arteriogram 7/10 status post multiple lesions all angioplastied with excellent result and no residual stenosis.  Timing and management of the necrotic toe to be determined by vascular surgery but probably needs to be amputated. -Continue broad-spectrum antibiotics for now, MRSA PCR was negative and vancomycin will be discontinued -All cultures remain negative so far  Active Problems Acute kidney injury on chronic kidney disease stage IIIa-Baseline creatinine 1.2-1.3, remains at baseline today  Chronic combined systolic and diastolic CHF-Echo in 2017 showed EF 15 to 20%, has recovered in August 28 2154-60% and grade 2 DD. -Hold losartan, placed back on Lasix  Essential hypertension-blood pressure slightly higher but acceptable, continue metoprolol and Lasix  PAF-on metoprolol, continue to hold Coumadin for probable surgery.  INR this morning is 1.8, will resume heparin tonight 8 hours after the procedure, no bolus.  History of LV thrombus-most recent echo was negative for thrombus  DM2-continue sliding scale, CBGs acceptable  CBG (last 3)  Recent Labs    05/19/21 0401 05/19/21 0806 05/19/21 1300  GLUCAP 208* 193* 200*    Check INR.   Continue metoprolol.  Scheduled Meds:  [MAR Hold] furosemide  40 mg Oral BID   [MAR Hold] insulin aspart  0-15 Units Subcutaneous TID WC   [MAR Hold] metoprolol succinate  100 mg Oral BID   Continuous Infusions:  sodium chloride     sodium chloride 100 mL/hr at 05/19/21 1310   [MAR Hold] ceFEPime (MAXIPIME) IV 2 g (05/19/21 0108)   [MAR Hold] metronidazole 500 mg (05/19/21 0606)   [MAR Hold] vancomycin 1,000 mg (05/19/21 0856)   PRN Meds:.[MAR Hold] acetaminophen **OR** [MAR Hold] acetaminophen, acetaminophen, hydrALAZINE, [MAR Hold] hydrOXYzine, labetalol, [MAR Hold] ondansetron (ZOFRAN) IV, ondansetron (ZOFRAN) IV  Diet Orders (From admission, onward)     Start     Ordered   05/19/21 0841  Diet NPO time specified Except for: Sips with Meds  Diet effective midnight       Comments: Except for lasix  Question:  Except for  Answer:  Clearance Coots with Meds   05/19/21 0840            DVT prophylaxis: SCDs Start: 05/16/21 0041     Code Status: Full Code  Family Communication: No family at bedside, discussed with daughter over the phone  Status is: Inpatient  Remains inpatient appropriate because:Inpatient level of care appropriate due to severity of illness  Dispo: The patient is from: Home              Anticipated d/c is to: Home              Patient currently is not medically stable to d/c.   Difficult to place patient No   Level of care: Med-Surg  Consultants:  Orthopedic  surgery  Procedures:  None   Microbiology  Blood cultures   Antimicrobials: Cefepime / Metronidazole 7/7 >>  Vanc 7/7 >> 7/11  Objective: Vitals:   05/19/21 1310 05/19/21 1315 05/19/21 1320 05/19/21 1325  BP: (!) 145/94 (!) 154/94 (!) 157/91 (!) 164/94  Pulse: 90 83 81 84  Resp: 13 14 10 16   Temp:      TempSrc:      SpO2: 97% 97% 98% 98%  Weight:      Height:        Intake/Output Summary (Last 24 hours) at 05/19/2021 1357 Last data filed at 05/19/2021 0800 Gross per 24 hour  Intake  120 ml  Output 400 ml  Net -280 ml    Filed Weights   05/16/21 0129  Weight: 91 kg    Examination:  Constitutional: No distress Eyes: No scleral icterus ENMT: Moist mucous membranes Neck: normal, supple Respiratory: Clear to auscultation bilaterally, no wheezing Cardiovascular: Regular rate and rhythm, no murmurs Abdomen: Soft, nontender, nondistended, bowel sounds positive Musculoskeletal: no clubbing /left fifth toe necrosis.  Skin: No rashes seen Neurologic: No focal deficits   Data Reviewed: I have independently reviewed following labs and imaging studies   CBC: Recent Labs  Lab 05/15/21 1853 05/16/21 0310 05/18/21 0406 05/19/21 0850  WBC 10.7* 8.3 9.4 9.7  NEUTROABS 7.8*  --   --   --   HGB 13.2 12.4* 12.1* 12.5*  HCT 40.2 37.8* 36.3* 37.2*  MCV 89.1 88.3 87.7 86.9  PLT 211 161 148* 174    Basic Metabolic Panel: Recent Labs  Lab 05/15/21 1853 05/16/21 0310 05/17/21 0129 05/18/21 0406 05/19/21 0850  NA 134* 134*  --  134* 136  K 3.6 3.5  --  3.3* 3.6  CL 95* 95*  --  101 102  CO2 28 31  --  22 27  GLUCOSE 308* 354*  --  212* 199*  BUN 25* 21  --  18 15  CREATININE 1.65* 1.59* 1.38* 1.26* 1.31*  CALCIUM 8.9 8.9  --  8.7* 8.7*    Liver Function Tests: Recent Labs  Lab 05/15/21 1853  AST 16  ALT 23  ALKPHOS 42  BILITOT 1.1  PROT 7.9  ALBUMIN 3.9    Coagulation Profile: Recent Labs  Lab 05/15/21 2323 05/16/21 0310 05/17/21 0809 05/18/21 0406 05/19/21 0850  INR 5.1* 4.9* 4.0* 2.6* 1.8*    HbA1C: No results for input(s): HGBA1C in the last 72 hours. CBG: Recent Labs  Lab 05/18/21 2004 05/19/21 0001 05/19/21 0401 05/19/21 0806 05/19/21 1300  GLUCAP 202* 210* 208* 193* 200*     Recent Results (from the past 240 hour(s))  SARS CORONAVIRUS 2 (TAT 6-24 HRS) Nasopharyngeal Nasopharyngeal Swab     Status: None   Collection Time: 05/15/21 11:23 PM   Specimen: Nasopharyngeal Swab  Result Value Ref Range Status   SARS  Coronavirus 2 NEGATIVE NEGATIVE Final    Comment: (NOTE) SARS-CoV-2 target nucleic acids are NOT DETECTED.  The SARS-CoV-2 RNA is generally detectable in upper and lower respiratory specimens during the acute phase of infection. Negative results do not preclude SARS-CoV-2 infection, do not rule out co-infections with other pathogens, and should not be used as the sole basis for treatment or other patient management decisions. Negative results must be combined with clinical observations, patient history, and epidemiological information. The expected result is Negative.  Fact Sheet for Patients: 07/16/21  Fact Sheet for Healthcare Providers: HairSlick.no  This test is not yet approved or cleared  by the [place] and  has been authorized for detection and/or diagnosis of SARS-CoV-2 by FDA under an Emergency Use Authorization (EUA). This EUA will remain  in effect (meaning this test can be used) for the duration of the COVID-19 declaration under Se ction 564(b)(1) of the Act, 21 U.S.C. section 360bbb-3(b)(1), unless the authorization is terminated or revoked sooner.  Performed at [facility], [address] [place], [place] [address]   Culture, blood (routine x 2)     Status: None (Preliminary result)   Collection Time: 05/16/21  3:10 AM   Specimen: BLOOD  Result Value Ref Range Status   Specimen Description BLOOD SITE NOT SPECIFIED  Final   Special Requests   Final    BOTTLES DRAWN AEROBIC AND ANAEROBIC Blood Culture adequate volume   Culture   Final    NO GROWTH 3 DAYS Performed at [facility], [address], [place], [place] [address]    Report Status PENDING  Incomplete  Culture, blood (routine x 2)     Status: None (Preliminary result)   Collection Time: 05/16/21  3:10 AM   Specimen: BLOOD  Result Value Ref Range Status   Specimen Description BLOOD SITE NOT SPECIFIED  Final   Special  Requests   Final    BOTTLES DRAWN AEROBIC AND ANAEROBIC Blood Culture adequate volume   Culture   Final    NO GROWTH 3 DAYS Performed at [facility], [address] [place], [place] [address]    Report Status PENDING  Incomplete  MRSA Next Gen by PCR, Nasal     Status: None   Collection Time: 05/18/21 11:15 AM   Specimen: Nasal Mucosa; Nasal Swab  Result Value Ref Range Status   MRSA by PCR Next Gen NOT DETECTED NOT DETECTED Final    Comment: (NOTE) The GeneXpert MRSA Assay (FDA approved for NASAL specimens only), is one component of a comprehensive MRSA colonization surveillance program. It is not intended to diagnose MRSA infection nor to guide or monitor treatment for MRSA infections. Test performance is not FDA approved in patients less than 37 years old. Performed at [facility], [address] [place], [place] [address]   Surgical pcr screen     Status: None   Collection Time: 05/19/21  8:42 AM   Specimen: Nasal Mucosa; Nasal Swab  Result Value Ref Range Status   MRSA, PCR NEGATIVE NEGATIVE Final   Staphylococcus aureus NEGATIVE NEGATIVE Final    Comment: (NOTE) The Xpert SA Assay (FDA approved for NASAL specimens in patients 32 years of age and older), is one component of a comprehensive surveillance program. It is not intended to diagnose infection nor to guide or monitor treatment. Performed at [facility], [address] [place], [place] [address]       Radiology Studies: PERIPHERAL VASCULAR CATHETERIZATION  Result Date: 05/19/2021 Formatting of this result is different from the original. 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  Ultrasound-guided access of right common femoral artery 2.   Aortogram including catheter selection of aorta 3.  Bilateral lower extremity arteriogram with selection third order branches left lower extremity 4.  Left peroneal angioplasty (3 mm x 40 mm Sterling) 5.  Mynx closure of the right common femoral artery 6.  43 minutes of monitored moderate conscious sedation time   Indications: Patient is a 70 year old male who was seen in consultation over the weekend by Dr. Lenell Antu with left fifth toe gangrene and ischemic changes.  He had abnormal monophasic waveforms at the ankle.  He presents today for aortogram, left lower extremity arteriogram, and possible intervention after risk benefits discussed.   Findings: Aortogram showed patent renal arteries bilaterally with no flow-limiting stenosis in the aortoiliac segment.  The left lower extremity which is the side of interest showed a patent common femoral, profunda, SFA, above and below-knee popliteal artery without flow-limiting stenosis.  He has severe tibial disease with only peroneal runoff.  There was a 50% stenosis in the proximal peroneal, an 80% focal stenosis in the mid peroneal, and a short subtotal occlusion of the distal peroneal.  Th peroneal did reconstitute a PT at the ankle.  The peroneal lesion was then crossed and all lesions were angioplastied with 3 mm Sterling with excellent results and no residual stenosis.   Right lower extremity also shows patent common femoral, profunda, SFA, popliteal with tibial disease and only single peroneal runoff but also has a high-grade stenosis in the distal vessel reconstituting the posterior tibial             Procedure:  The patient was identified in the holding area and taken to room 8.  The patient was then placed supine on the table and prepped and draped in the usual sterile fashion.  A time out was called.  Ultrasound was used to evaluate the right common femoral artery.  It was patent .  A digital ultrasound image was acquired.  A micropuncture needle was used to  access the right common femoral artery under ultrasound guidance.  An 018 wire was advanced without resistance and a micropuncture sheath was placed.  The 018 wire was removed and a benson wire was placed.  The micropuncture sheath was exchanged for a 5 french sheath.  An omniflush catheter was advanced over the wire to the level of L-1.  An abdominal angiogram was obtained.  Next the catheter was pulled down and bilateral lower extremity runoff was obtained.  After evaluating images elected to intervene on the left peroneal artery.  I crossed the aortic bifurcation and placed my catheter into the left SFA where then placed a Rosen wire we exchanged for a long 5 French Ansell sheath in the right groin over the aortic bifurcation.  Patient was given 100 units/kg IV heparin.  I then used a long straight 100 cm flush catheter to get better dedicated images of the left peroneal artery.  We identified 3 lesions as noted above.  Lesions were then all crossed with a V 18 wire.  I then angioplastied each individual lesion with a 3 mm x 40 mm Sterling to nominal pressure for 2 minutes each.  There was no residual stenosis or dissection with a widely patent vessel.  Satisfied the results wires and catheters were removed.  I exchanged for a short 5 French sheath in the right groin.  Mynx closure device was deployed.     Plan: Patient will be loaded on Plavix.  He now has inline flow and is optimized.   Cephus Shelling, MD [facility] Office: [phone]    Pamella Pert, MD, PhD [facility]  Between 7 am -  7 pm I am available, please contact me via Amion (for emergencies) or Securechat (non urgent messages)  Between 7 pm - 7 am I am not available, please contact night coverage MD/APP via Amion

## 2021-05-19 NOTE — Progress Notes (Signed)
Brief Nutrition Note  RD consulted for diet education. Per discussion with MD, pt's daughter requesting diet education.  RD attempted to speak with pt this morning in 5N08. However, pt had already left the room for procedure. Pt in [facility] throughout day today and remains there at this time. Unsure where pt will transfer after procedure.  RD will follow up tomorrow, 05/19/21, for ability to provide diet education as requested.   Darren Clause, MS, RD, LDN Inpatient Clinical Dietitian Please see AMiON for contact information.

## 2021-05-19 NOTE — Progress Notes (Signed)
ANTICOAGULATION CONSULT NOTE - Initial Consult  Pharmacy Consult for Heparin Indication: hx Afib and LV thrombus  No Known Allergies  Patient Measurements: Height: 5\' 8"  (172.7 cm) Weight: 91 kg (200 lb 9.9 oz) IBW/kg (Calculated) : 68.4 Heparin Dosing Weight: 87 kg  Vital Signs: Temp: 98.2 F (36.8 C) (07/11 0804) Temp Source: Oral (07/11 0804) BP: 137/82 (07/11 1410) Pulse Rate: 83 (07/11 1410)  Labs: Recent Labs    05/17/21 0129 05/17/21 0809 05/18/21 0406 05/19/21 0850  HGB  --   --  12.1* 12.5*  HCT  --   --  36.3* 37.2*  PLT  --   --  148* 174  LABPROT  --  39.2* 27.5* 20.6*  INR  --  4.0* 2.6* 1.8*  CREATININE 1.38*  --  1.26* 1.31*    Estimated Creatinine Clearance: 57.4 mL/min (A) (by C-G formula based on SCr of 1.31 mg/dL (H)).   Medical History: Past Medical History:  Diagnosis Date   A-fib [facility])    a. (06/04/14) TEE-DC-CV; succesful; large LA 6.2 cm   A-fib (HCC)    Ascites    CHF (congestive heart failure) (HCC)    Chronic systolic heart failure (HCC)    a. ECHO (05/2014): EF 25-30%, diff HK, RV midly dilated and sys fx mildly/mod reduced   CKD (chronic kidney disease)    CKD (chronic kidney disease) stage 3, GFR 30-59 ml/min (HCC)    Depression    Diabetes mellitus without complication (HCC)    HTN (hypertension)    Hypertension    OSA (obstructive sleep apnea)    Type 2 diabetes mellitus (HCC)     Assessment:  Anticoag:  hx Afib and LV thrombus 10/21, Start IV heparin 8hrs post-arteriogram for critical lumb ischemia (to recovery 1236). - Coumadin 6mg  daily except 3mg  on Tue/Sat. Admit INR 5.1  Goal of Therapy:  Heparin level 0.3-0.7 units/ml Monitor platelets by anticoagulation protocol: Yes   Plan:  Start IV heparin at 2030 at (no bolus) 1300 units/hr Daily HL and CBC Will need to stop Flagyl when Coumadin resumed. (Major drug intxn)   Numa Schroeter S. 11/21, PharmD, BCPS Clinical Staff Pharmacist [email or web address] , Lynley Killilea  Stillinger 05/19/2021,2:56 PM

## 2021-05-19 NOTE — Op Note (Signed)
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  Ultrasound-guided access of right common femoral artery 2.  Aortogram including catheter selection of aorta 3.  Bilateral lower extremity arteriogram with selection third order branches left lower extremity 4.  Left peroneal angioplasty (3 mm x 40 mm Sterling) 5.  Mynx closure of the right common femoral artery 6.  43 minutes of monitored moderate conscious sedation time  Indications: Patient is a 70 year old male who was seen in consultation over the weekend by Dr. Lenell Antu with left fifth toe gangrene and ischemic changes.  He had abnormal monophasic waveforms at the ankle.  He presents today for aortogram, left lower extremity arteriogram, and possible intervention after risk benefits discussed.  Findings: Aortogram showed patent renal arteries bilaterally with no flow-limiting stenosis in the aortoiliac segment.  The left lower extremity which is the side of interest showed a patent common femoral, profunda, SFA, above and below-knee popliteal artery without flow-limiting stenosis.  He has severe tibial disease with only peroneal runoff.  There was a 50% stenosis in the proximal peroneal, an 80% focal stenosis in the mid peroneal, and a short subtotal occlusion of the distal peroneal.  Th peroneal did reconstitute a PT at the ankle.  The peroneal lesion was then crossed and all lesions were angioplastied with 3 mm Sterling with excellent results and no residual stenosis.  Right lower extremity also shows patent common femoral, profunda, SFA, popliteal with tibial disease and only single peroneal runoff but also has a high-grade stenosis in the distal vessel reconstituting the posterior tibial   Procedure:  The patient  was identified in the holding area and taken to room 8.  The patient was then placed supine on the table and prepped and draped in the usual sterile fashion.  A time out was called.  Ultrasound was used to evaluate the right common femoral artery.  It was patent .  A digital ultrasound image was acquired.  A micropuncture needle was used to access the right common femoral artery under ultrasound guidance.  An 018 wire was advanced without resistance and a micropuncture sheath was placed.  The 018 wire was removed and a benson wire was placed.  The micropuncture sheath was exchanged for a 5 french sheath.  An omniflush catheter was advanced over the wire to the level of L-1.  An abdominal angiogram was obtained.  Next the catheter was pulled down and bilateral lower extremity runoff was obtained.  After evaluating images elected to intervene on the left peroneal artery.  I crossed the aortic bifurcation and placed my catheter into the left SFA where then placed a Rosen wire we exchanged for a long 5 French Ansell sheath in the right groin over the aortic bifurcation.  Patient was given 100 units/kg IV heparin.  I then used a long straight 100 cm flush catheter to get better dedicated images of the left peroneal artery.  We identified 3 lesions as noted above.  Lesions were then all crossed with a V 18 wire.  I then angioplastied each individual lesion with a 3 mm x 40 mm Sterling to nominal pressure for 2 minutes each.  There was no residual stenosis or dissection with a widely patent vessel.  Satisfied the results wires and catheters were removed.  I exchanged for a short  5 French sheath in the right groin.  Mynx closure device was deployed.   Plan: Patient will be loaded on Plavix.  He now has inline flow and is optimized.  Cephus Shelling, MD [facility] Office: [phone]  Cephus Shelling

## 2021-05-20 ENCOUNTER — Encounter (HOSPITAL_COMMUNITY): Payer: Self-pay | Admitting: Vascular Surgery

## 2021-05-20 DIAGNOSIS — I96 Gangrene, not elsewhere classified: Secondary | ICD-10-CM | POA: Diagnosis not present

## 2021-05-20 DIAGNOSIS — I5042 Chronic combined systolic (congestive) and diastolic (congestive) heart failure: Secondary | ICD-10-CM | POA: Diagnosis not present

## 2021-05-20 DIAGNOSIS — N179 Acute kidney failure, unspecified: Secondary | ICD-10-CM | POA: Diagnosis not present

## 2021-05-20 DIAGNOSIS — I10 Essential (primary) hypertension: Secondary | ICD-10-CM | POA: Diagnosis not present

## 2021-05-20 LAB — CBC
HCT: 35.5 % — ABNORMAL LOW (ref 39.0–52.0)
Hemoglobin: 11.5 g/dL — ABNORMAL LOW (ref 13.0–17.0)
MCH: 29.1 pg (ref 26.0–34.0)
MCHC: 32.4 g/dL (ref 30.0–36.0)
MCV: 89.9 fL (ref 80.0–100.0)
Platelets: 175 10*3/uL (ref 150–400)
RBC: 3.95 MIL/uL — ABNORMAL LOW (ref 4.22–5.81)
RDW: 13.8 % (ref 11.5–15.5)
WBC: 8.6 10*3/uL (ref 4.0–10.5)
nRBC: 0 % (ref 0.0–0.2)

## 2021-05-20 LAB — HEPARIN LEVEL (UNFRACTIONATED)
Heparin Unfractionated: 0.16 IU/mL — ABNORMAL LOW (ref 0.30–0.70)
Heparin Unfractionated: 0.3 IU/mL (ref 0.30–0.70)
Heparin Unfractionated: 0.4 IU/mL (ref 0.30–0.70)

## 2021-05-20 LAB — GLUCOSE, CAPILLARY
Glucose-Capillary: 134 mg/dL — ABNORMAL HIGH (ref 70–99)
Glucose-Capillary: 132 mg/dL — ABNORMAL HIGH (ref 70–99)
Glucose-Capillary: 176 mg/dL — ABNORMAL HIGH (ref 70–99)
Glucose-Capillary: 252 mg/dL — ABNORMAL HIGH (ref 70–99)
Glucose-Capillary: 166 mg/dL — ABNORMAL HIGH (ref 70–99)
Glucose-Capillary: 233 mg/dL — ABNORMAL HIGH (ref 70–99)

## 2021-05-20 LAB — PROTIME-INR
INR: 1.9 — ABNORMAL HIGH (ref 0.8–1.2)
Prothrombin Time: 22 seconds — ABNORMAL HIGH (ref 11.4–15.2)

## 2021-05-20 LAB — CREATININE, SERUM
Creatinine, Ser: 1.25 mg/dL — ABNORMAL HIGH (ref 0.61–1.24)
GFR, Estimated: >60 mL/min (ref >60)

## 2021-05-20 MED FILL — Lidocaine HCl Local Preservative Free (PF) Inj 1%: INTRAMUSCULAR | Qty: 30 | Status: AC

## 2021-05-20 NOTE — Evaluation (Signed)
Physical Therapy Evaluation Patient Details Name: [name] MRN: [id number] DOB: 1951-04-04 Today's Date: 05/20/2021   History of Present Illness  Pt adm 7/7 with necrotic lt fifth toe with maggots. Underwent arteriogram 7/10 s/p multiple lesions angioplastied. Management of necrotic toe pending. PMH - chf, htn, ckd,dm  Clinical Impression  Pt presents to PT requiring assist with all mobility due to pain and weakness in LE's. Pt not very forthcoming about is he has anyone who can assist him at home at dc. From what I gather pt has had some prior rt leg weakness and knee pain prior to these problems with his LLE. If he has surgery on lt foot that result in weight bearing limitations doubt that he will be able to maintain those. Currently will recommend SNF unless he improves significantly in the next few days or we find he has more support than he is currently acknowledging.     Follow Up Recommendations SNF    Equipment Recommendations  Rolling walker with 5" wheels    Recommendations for Other Services       Precautions / Restrictions Precautions Precautions: Fall;Other (comment) Precaution Comments: Very poor vision      Mobility  Bed Mobility Overal bed mobility: Needs Assistance Bed Mobility: Supine to Sit;Sit to Supine     Supine to sit: Mod assist Sit to supine: Min assist   General bed mobility comments: Assist to elevate trunk into sitting. Assist to bring legs back into bed returning to supine    Transfers Overall transfer level: Needs assistance Equipment used: Rolling walker (2 wheeled) Transfers: Sit to/from Stand Sit to Stand: Mod assist;+2 safety/equipment;From elevated surface         General transfer comment: Assist to bring hips up and for balance from elevated bed  Ambulation/Gait Ambulation/Gait assistance: Min assist Gait Distance (Feet): 20 Feet Assistive device: Rolling walker (2 wheeled) Gait Pattern/deviations: Step-to  pattern;Step-through pattern;Decreased step length - right;Decreased step length - left;Trunk flexed Gait velocity: decr Gait velocity interpretation: <1.31 ft/sec, indicative of household ambulator General Gait Details: Assist for balance and support  Information systems manager [employer] (Stroke Patients Only)       Balance Overall balance assessment: Needs assistance Sitting-balance support: Bilateral upper extremity supported;Feet supported Sitting balance-Leahy Scale: Poor Sitting balance - Comments: UE support for static sitting   Standing balance support: Bilateral upper extremity supported Standing balance-Leahy Scale: Poor Standing balance comment: walker and min assist for static standing                             Pertinent Vitals/Pain Pain Assessment: Faces Faces Pain Scale: Hurts even more Pain Location: rt knee and lt foot Pain Descriptors / Indicators: Guarding;Grimacing Pain Intervention(s): Limited activity within patient's tolerance;Monitored during session;Repositioned    Home Living Family/patient expects to be discharged to:: Private residence                 Additional Comments: Attempted to get information about home living but pt wouldn't answer questions directly. When asked if he lived with anyone he said it wouldn't matter. Answered other questions in similar matter.    Prior Function Level of Independence: Needs assistance   Gait / Transfers Assistance Needed: Pt not answering question directly. He was ambulatory but unsure if he used assistive device he said if he could find one.  Hand Dominance   Dominant Hand: Right    Extremity/Trunk Assessment   Upper Extremity Assessment Upper Extremity Assessment: Defer to OT evaluation    Lower Extremity Assessment Lower Extremity Assessment: RLE deficits/detail;LLE deficits/detail;Generalized weakness RLE Deficits / Details: Limited by  pain and by chronic weakness due to prior injury LLE Deficits / Details: Limited by pain       Communication   Communication: No difficulties  Cognition Arousal/Alertness: Awake/alert Behavior During Therapy: Flat affect Overall Cognitive Status: No family/caregiver present to determine baseline cognitive functioning                                 General Comments: Pt follows one step commands. Tangential speech and lots of general complaints about the world. Very difficult to discern what support he will have at home.      General Comments General comments (skin integrity, edema, etc.): HR 120-130's with mobility    Exercises     Assessment/Plan    PT Assessment Patient needs continued PT services  PT Problem List Decreased strength;Decreased activity tolerance;Decreased balance;Decreased mobility;Pain       PT Treatment Interventions DME instruction;Gait training;Stair training;Functional mobility training;Therapeutic activities;Therapeutic exercise;Balance training;Patient/family education    PT Goals (Current goals can be found in the Care Plan section)  Acute Rehab PT Goals Patient Stated Goal: Did not state PT Goal Formulation: With patient Time For Goal Achievement: 06/03/21 Potential to Achieve Goals: Fair    Frequency Min 3X/week   Barriers to discharge Decreased caregiver support Unsure if he has any support at home    Co-evaluation               AM-PAC PT "6 Clicks" Mobility  Outcome Measure Help needed turning from your back to your side while in a flat bed without using bedrails?: A Little Help needed moving from lying on your back to sitting on the side of a flat bed without using bedrails?: A Lot Help needed moving to and from a bed to a chair (including a wheelchair)?: A Lot Help needed standing up from a chair using your arms (e.g., wheelchair or bedside chair)?: A Lot Help needed to walk in hospital room?: A Little Help needed  climbing 3-5 steps with a railing? : Total 6 Click Score: 13    End of Session Equipment Utilized During Treatment: Gait belt Activity Tolerance: Patient limited by fatigue Patient left: in bed;with call bell/phone within reach;with bed alarm set Nurse Communication: Mobility status;Other (comment) (IV out) PT Visit Diagnosis: Unsteadiness on feet (R26.81);Other abnormalities of gait and mobility (R26.89);Muscle weakness (generalized) (M62.81);Pain Pain - Right/Left:  (bilateral) Pain - part of body: Ankle and joints of foot;Knee    Time: 1250-1325 PT Time Calculation (min) (ACUTE ONLY): 35 min   Charges:   PT Evaluation $PT Eval Moderate Complexity: 1 Mod PT Treatments $Gait Training: 8-22 mins        [facility] PT Acute Rehabilitation Services Pager [phone] Office [phone]   Angelina Ok [facility] 05/20/2021, 3:47 PM

## 2021-05-20 NOTE — Plan of Care (Signed)
Nutrition Education Note  RD consulted for nutrition education regarding a Heart Healthy/Carb Modified diet. MD reports that pt's daughter requested diet education.  Met with pt at bedside. Provided "Heart Healthy, Consistent Carbohydrate Nutrition Therapy" handout from the [employer] and left handout in room. No family present at time of RD visit. Pt declines RD providing him with diet education but refers RD to his daughter. Pt gives RD permission to call his daughter [name] whose phone number is listed in chart.  RD called daughter [name] but no answer. Left voicemail discussing diet education handout left in pt's hospital room. Provided pt's daughter with RD's office phone number to call with questions.  Lipid Panel     Component Value Date/Time   CHOL 106 03/15/2016 0525   TRIG 77 03/15/2016 0525   HDL 16 (L) 03/15/2016 0525   CHOLHDL 6.6 03/15/2016 0525   VLDL 15 03/15/2016 0525   LDLCALC 75 03/15/2016 0525   Lab Results  Component Value Date   HGBA1C 6.5 (H) 03/14/2016    Handout discourages intake of processed foods and use of salt shaker and encourages intake of fresh fruits and vegetables as well as whole grain sources of carbohydrates to maximize fiber intake.   Handout also discusses different food groups and their effects on blood sugar, emphasizing carbohydrate-containing foods. Provided list of carbohydrates and recommended serving sizes of common foods.  Handout discusses importance of controlled and consistent carbohydrate intake throughout the day. Examples of ways to balance meals/snacks are provided.  Expect fair compliance.  Current diet order is Carb Modified, patient is consuming approximately 85% of meals at this time. Labs and medications reviewed. No further nutrition interventions warranted at this time. RD contact information provided. If additional nutrition issues arise, please re-consult RD.   Gustavus Bryant, MS, RD, LDN Inpatient  Clinical Dietitian Please see [facility] for contact information.

## 2021-05-20 NOTE — Progress Notes (Signed)
ANTICOAGULATION CONSULT NOTE - Follow Up Consult  Pharmacy Consult for heparin Indication:  Afib and LV thrombus   Labs: Recent Labs    05/18/21 0406 05/19/21 0850 05/20/21 0331  HGB 12.1* 12.5* 11.5*  HCT 36.3* 37.2* 35.5*  PLT 148* 174 175  LABPROT 27.5* 20.6* 22.0*  INR 2.6* 1.8* 1.9*  HEPARINUNFRC  --   --  0.16*  CREATININE 1.26* 1.31* 1.25*    Assessment: 69yo male subtherapeutic on heparin with initial dosing while INR below goal; no gtt issues or signs of bleeding per RN.  Goal of Therapy:  Heparin level 0.3-0.7 units/ml   Plan:  Will increase heparin gtt by 3 units/kg/hr to 1600 units/hr and check level in 6-8 hours.    Vernard Gambles, PharmD, BCPS  05/20/2021,4:27 AM

## 2021-05-20 NOTE — Progress Notes (Signed)
ANTICOAGULATION CONSULT NOTE - Follow Up Consult  Pharmacy Consult for Heparin Indication: Afib and LV thrombus   No Known Allergies  Patient Measurements: Height: 5\' 8"  (172.7 cm) Weight: 91 kg (200 lb 9.9 oz) IBW/kg (Calculated) : 68.4 Heparin Dosing Weight:   Vital Signs: Temp: 98.7 F (37.1 C) (07/12 1940) Temp Source: Oral (07/12 1940) BP: 148/85 (07/12 1940) Pulse Rate: 106 (07/12 1940)  Labs: Recent Labs    05/18/21 0406 05/19/21 0850 05/20/21 0331 05/20/21 1141 05/20/21 1932  HGB 12.1* 12.5* 11.5*  --   --   HCT 36.3* 37.2* 35.5*  --   --   PLT 148* 174 175  --   --   LABPROT 27.5* 20.6* 22.0*  --   --   INR 2.6* 1.8* 1.9*  --   --   HEPARINUNFRC  --   --  0.16* 0.30 0.40  CREATININE 1.26* 1.31* 1.25*  --   --     Estimated Creatinine Clearance: 60.2 mL/min (A) (by C-G formula based on SCr of 1.25 mg/dL (H)).   Assessment:  Anticoag:  hx Afib and LV thrombus 10/21, Start IV heparin 8hrs post-arteriogram for critical lumb ischemia (to recovery 1236). - Heparin level 0.4 in goal. - Coumadin 6mg  daily except 3mg  on Tue/Sat. Admit INR 5.1  Goal of Therapy:  Heparin level 0.3-0.7 units/ml Monitor platelets by anticoagulation protocol: Yes   Plan:  -Con't IV heparin at 1700 units/hr  -Daily HL and CBC   Jerald Hennington S. 11/21, PharmD, BCPS Clinical Staff Pharmacist [email or web address]  05/20/2021,8:24 PM

## 2021-05-20 NOTE — Social Work (Signed)
1500: CSW provided pt with Medicaid eligibility information to go over with his daughter. Pt would like to contact CSW when his daughter arrives, CSW let pt know that would be okay to contact CSW for further information if needed.

## 2021-05-20 NOTE — Progress Notes (Signed)
PROGRESS NOTE  [name] BTD:[id number] DOB: 03-Jul-1951 DOA: 05/15/2021 PCP: Patient, No Pcp Per (Inactive)   LOS: 5 days   Brief Narrative / Interim history: 70 year old male with history of chronic combined CHF, HTN, PAF on Coumadin, LV thrombus, CKD 3A, DM2 comes into the hospital with pain in the left pinky toe as well as the toe has been turning black.  He was found to have necrosis with maggots, was febrile to 100.3 and admitted to the hospital.  Orthopedic and vascular surgery consulted. He underwent arteriogram 7/10 with multiple lesions all angioplastied.  Now awaiting final plan for potential amputation of the left fifth toe from Dr. Lajoyce Corners  Subjective / 24h Interval events: No complaints today, denies any chest pain, denies any shortness of breath.  No foot pain.  Assessment & Plan: Principal Problem Necrotic left fifth toe-orthopedic and vascular surgery consulted, appreciate input.  He underwent arteriogram 7/10 status post multiple lesions all angioplastied with excellent result and no residual stenosis.  Vascular surgery will discuss with Dr. Lajoyce Corners for definitive treatment of his left fifth necrotic toe.  For now continue antibiotics with cefepime and metronidazole, awaiting timing of surgery.  Has been placed on Plavix postprocedure -All cultures remain negative so far  Active Problems Acute kidney injury on chronic kidney disease stage IIIa-Baseline creatinine 1.2-1.3, creatinine has remained at baseline throughout this hospital stay  Chronic combined systolic and diastolic CHF-Echo in 2017 showed EF 15 to 20%, has recovered in August 28 2154-60% and grade 2 DD. -Hold losartan, continue Lasix  Essential hypertension-normotensive, continue metoprolol and Lasix  PAF-on metoprolol, currently in A. fib with rates 90-100, continue to hold Coumadin for probable surgery and he is on heparin infusion while awaiting orthopedic surgery input regarding timing of his fifth toe  amputation  History of LV thrombus-most recent echo was negative for thrombus  DM2-continue sliding scale, CBGs acceptable  CBG (last 3)  Recent Labs    05/20/21 0155 05/20/21 0352 05/20/21 0543  GLUCAP 134* 132* 176*    Check INR.  Continue metoprolol.  Scheduled Meds:  atorvastatin  10 mg Oral Daily   clopidogrel  75 mg Oral Q breakfast   furosemide  40 mg Oral BID   insulin aspart  0-15 Units Subcutaneous TID WC   metoprolol succinate  100 mg Oral BID   sodium chloride flush  3 mL Intravenous Q12H   Continuous Infusions:  sodium chloride 100 mL/hr at 05/20/21 0913   sodium chloride     ceFEPime (MAXIPIME) IV 2 g (05/19/21 2326)   heparin 1,600 Units/hr (05/20/21 0423)   metronidazole 500 mg (05/20/21 0524)   PRN Meds:.sodium chloride, acetaminophen **OR** acetaminophen, acetaminophen, hydrALAZINE, hydrOXYzine, labetalol, ondansetron (ZOFRAN) IV, ondansetron (ZOFRAN) IV, sodium chloride flush  Diet Orders (From admission, onward)     Start     Ordered   05/19/21 1808  Diet regular Room service appropriate? Yes; Fluid consistency: Thin  Diet effective now       Question Answer Comment  Room service appropriate? Yes   Fluid consistency: Thin      05/19/21 1807            DVT prophylaxis: SCDs Start: 05/16/21 0041     Code Status: Full Code  Family Communication: No family at bedside, discussed with daughter over the phone 7/11  Status is: Inpatient  Remains inpatient appropriate because:Inpatient level of care appropriate due to severity of illness  Dispo: The patient is from: Home  Anticipated d/c is to: Home              Patient currently is not medically stable to d/c.   Difficult to place patient No   Level of care: Progressive Cardiac  Consultants:  Orthopedic surgery  Procedures:  None   Microbiology  Blood cultures   Antimicrobials: Cefepime / Metronidazole 7/7 >>  Vanc 7/7 >> 7/11  Objective: Vitals:   05/19/21  2300 05/20/21 0300 05/20/21 0409 05/20/21 0821  BP: 123/67 115/86  135/88  Pulse: 83 85 80 93  Resp: 17 14 15 14   Temp: 98.9 F (37.2 C) 98.6 F (37 C)  98.8 F (37.1 C)  TempSrc: Oral Oral  Oral  SpO2: 95%  96% 97%  Weight:      Height:        Intake/Output Summary (Last 24 hours) at 05/20/2021 0948 Last data filed at 05/20/2021 0600 Gross per 24 hour  Intake 1549.19 ml  Output 1075 ml  Net 474.19 ml    Filed Weights   05/16/21 0129  Weight: 91 kg    Examination:  Constitutional: He is in no distress, in bed Eyes: No scleral icterus ENMT: Moist mucous membranes Neck: normal, supple Respiratory: Lungs are clear bilaterally, no wheezing heard.  No crackles Cardiovascular: Regular rate and rhythm, no murmurs appreciated.  No peripheral edema Abdomen: Abdomen is soft, nontender, nondistended, bowel sounds positive Musculoskeletal: left fifth toe necrosis.  Unchanged Skin: No rashes seen Neurologic: Nonfocal, equal strength   Data Reviewed: I have independently reviewed following labs and imaging studies   CBC: Recent Labs  Lab 05/15/21 1853 05/16/21 0310 05/18/21 0406 05/19/21 0850 05/20/21 0331  WBC 10.7* 8.3 9.4 9.7 8.6  NEUTROABS 7.8*  --   --   --   --   HGB 13.2 12.4* 12.1* 12.5* 11.5*  HCT 40.2 37.8* 36.3* 37.2* 35.5*  MCV 89.1 88.3 87.7 86.9 89.9  PLT 211 161 148* 174 175    Basic Metabolic Panel: Recent Labs  Lab 05/15/21 1853 05/16/21 0310 05/17/21 0129 05/18/21 0406 05/19/21 0850 05/20/21 0331  NA 134* 134*  --  134* 136  --   K 3.6 3.5  --  3.3* 3.6  --   CL 95* 95*  --  101 102  --   CO2 28 31  --  22 27  --   GLUCOSE 308* 354*  --  212* 199*  --   BUN 25* 21  --  18 15  --   CREATININE 1.65* 1.59* 1.38* 1.26* 1.31* 1.25*  CALCIUM 8.9 8.9  --  8.7* 8.7*  --     Liver Function Tests: Recent Labs  Lab 05/15/21 1853  AST 16  ALT 23  ALKPHOS 42  BILITOT 1.1  PROT 7.9  ALBUMIN 3.9    Coagulation Profile: Recent Labs  Lab  05/16/21 0310 05/17/21 0809 05/18/21 0406 05/19/21 0850 05/20/21 0331  INR 4.9* 4.0* 2.6* 1.8* 1.9*    HbA1C: No results for input(s): HGBA1C in the last 72 hours. CBG: Recent Labs  Lab 05/19/21 1858 05/19/21 2124 05/20/21 0155 05/20/21 0352 05/20/21 0543  GLUCAP 144* 123* 134* 132* 176*     Recent Results (from the past 240 hour(s))  SARS CORONAVIRUS 2 (TAT 6-24 HRS) Nasopharyngeal Nasopharyngeal Swab     Status: None   Collection Time: 05/15/21 11:23 PM   Specimen: Nasopharyngeal Swab  Result Value Ref Range Status   SARS Coronavirus 2 NEGATIVE NEGATIVE Final    Comment: (  NOTE) SARS-CoV-2 target nucleic acids are NOT DETECTED.  The SARS-CoV-2 RNA is generally detectable in upper and lower respiratory specimens during the acute phase of infection. Negative results do not preclude SARS-CoV-2 infection, do not rule out co-infections with other pathogens, and should not be used as the sole basis for treatment or other patient management decisions. Negative results must be combined with clinical observations, patient history, and epidemiological information. The expected result is Negative.  Fact Sheet for Patients: HairSlick.no  Fact Sheet for Healthcare Providers: quierodirigir.com  This test is not yet approved or cleared by the [place] FDA and  has been authorized for detection and/or diagnosis of SARS-CoV-2 by FDA under an Emergency Use Authorization (EUA). This EUA will remain  in effect (meaning this test can be used) for the duration of the COVID-19 declaration under Se ction 564(b)(1) of the Act, 21 U.S.C. section 360bbb-3(b)(1), unless the authorization is terminated or revoked sooner.  Performed at [facility], [address], [place], [place] [address]   Culture, blood (routine x 2)     Status: None (Preliminary result)   Collection Time: 05/16/21  3:10 AM   Specimen: BLOOD  Result  Value Ref Range Status   Specimen Description BLOOD SITE NOT SPECIFIED  Final   Special Requests   Final    BOTTLES DRAWN AEROBIC AND ANAEROBIC Blood Culture adequate volume   Culture   Final    NO GROWTH 4 DAYS Performed at [facility], [address], [place], [place] [address]    Report Status PENDING  Incomplete  Culture, blood (routine x 2)     Status: None (Preliminary result)   Collection Time: 05/16/21  3:10 AM   Specimen: BLOOD  Result Value Ref Range Status   Specimen Description BLOOD SITE NOT SPECIFIED  Final   Special Requests   Final    BOTTLES DRAWN AEROBIC AND ANAEROBIC Blood Culture adequate volume   Culture   Final    NO GROWTH 4 DAYS Performed at [facility], [address] [place], [place] [address]    Report Status PENDING  Incomplete  MRSA Next Gen by PCR, Nasal     Status: None   Collection Time: 05/18/21 11:15 AM   Specimen: Nasal Mucosa; Nasal Swab  Result Value Ref Range Status   MRSA by PCR Next Gen NOT DETECTED NOT DETECTED Final    Comment: (NOTE) The GeneXpert MRSA Assay (FDA approved for NASAL specimens only), is one component of a comprehensive MRSA colonization surveillance program. It is not intended to diagnose MRSA infection nor to guide or monitor treatment for MRSA infections. Test performance is not FDA approved in patients less than 66 years old. Performed at [facility], [address], [place], [place] [address]   Surgical pcr screen     Status: None   Collection Time: 05/19/21  8:42 AM   Specimen: Nasal Mucosa; Nasal Swab  Result Value Ref Range Status   MRSA, PCR NEGATIVE NEGATIVE Final   Staphylococcus aureus NEGATIVE NEGATIVE Final    Comment: (NOTE) The Xpert SA Assay (FDA approved for NASAL specimens in patients 32 years of age and older), is one component of a comprehensive surveillance program. It is not intended to diagnose infection nor to guide or monitor treatment. Performed at [facility], [address] [place], [place] [address]       Radiology Studies: PERIPHERAL VASCULAR CATHETERIZATION  Result Date: 05/19/2021 Formatting of  this result is different from the original. 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  Ultrasound-guided access of right common femoral artery 2.  Aortogram including catheter selection of aorta 3.  Bilateral lower extremity arteriogram with selection third order branches left lower extremity 4.  Left peroneal angioplasty (3 mm x 40 mm Sterling) 5.  Mynx closure of the right common femoral artery 6.  43 minutes of monitored moderate conscious sedation time   Indications: Patient is a 70 year old male who was seen in consultation over the weekend by Dr. Lenell Antu with left fifth toe gangrene and ischemic changes.  He had abnormal monophasic waveforms at the ankle.  He presents today for aortogram, left lower extremity arteriogram, and possible intervention after risk benefits discussed.   Findings: Aortogram showed patent renal arteries bilaterally with no flow-limiting stenosis in the aortoiliac segment.  The left lower extremity which is the side of interest showed a patent common femoral, profunda, SFA, above and below-knee popliteal artery without flow-limiting stenosis.  He has severe tibial disease with only peroneal runoff.  There was a 50% stenosis in the proximal peroneal, an 80% focal stenosis in the mid peroneal, and a short subtotal occlusion of the distal peroneal.  Th peroneal did reconstitute a PT at the ankle.  The peroneal lesion was then crossed and all lesions were angioplastied with 3 mm Sterling with excellent results and no residual stenosis.   Right lower extremity also shows patent common  femoral, profunda, SFA, popliteal with tibial disease and only single peroneal runoff but also has a high-grade stenosis in the distal vessel reconstituting the posterior tibial             Procedure:  The patient was identified in the holding area and taken to room 8.  The patient was then placed supine on the table and prepped and draped in the usual sterile fashion.  A time out was called.  Ultrasound was used to evaluate the right common femoral artery.  It was patent .  A digital ultrasound image was acquired.  A micropuncture needle was used to access the right common femoral artery under ultrasound guidance.  An 018 wire was advanced without resistance and a micropuncture sheath was placed.  The 018 wire was removed and a benson wire was placed.  The micropuncture sheath was exchanged for a 5 french sheath.  An omniflush catheter was advanced over the wire to the level of L-1.  An abdominal angiogram was obtained.  Next the catheter was pulled down and bilateral lower extremity runoff was obtained.  After evaluating images elected to intervene on the left peroneal artery.  I crossed the aortic bifurcation and placed my catheter into the left SFA where then placed a Rosen wire we exchanged for a long 5 French Ansell sheath in the right groin over the aortic bifurcation.  Patient was given 100 units/kg IV heparin.  I then used a long straight 100 cm flush catheter to get better dedicated images of the left peroneal artery.  We identified 3 lesions as noted above.  Lesions were then all crossed with a V 18 wire.  I then angioplastied each individual lesion with a 3 mm x  40 mm Sterling to nominal pressure for 2 minutes each.  There was no residual stenosis or dissection with a widely patent vessel.  Satisfied the results wires and catheters were removed.  I exchanged for a short 5 French sheath in the right groin.  Mynx closure device was deployed.     Plan: Patient will be loaded on Plavix.  He now has inline  flow and is optimized.   Cephus Shellinghristopher J. Clark, MD [facility] Office: [phone]    Pamella Pertostin Pallie Swigert, MD, PhD [facility]  Between 7 am - 7 pm I am available, please contact me via Amion (for emergencies) or Securechat (non urgent messages)  Between 7 pm - 7 am I am not available, please contact night coverage MD/APP via Amion

## 2021-05-20 NOTE — Progress Notes (Signed)
ANTICOAGULATION CONSULT NOTE  Pharmacy Consult for Heparin Indication: hx Afib and LV thrombus  No Known Allergies  Patient Measurements: Height: 5\' 8"  (172.7 cm) Weight: 91 kg (200 lb 9.9 oz) IBW/kg (Calculated) : 68.4 Heparin Dosing Weight: 87 kg  Vital Signs: Temp: 98.8 F (37.1 C) (07/12 1137) Temp Source: Oral (07/12 1137) BP: 125/90 (07/12 1137) Pulse Rate: 101 (07/12 1137)  Labs: Recent Labs    05/18/21 0406 05/19/21 0850 05/20/21 0331 05/20/21 1141  HGB 12.1* 12.5* 11.5*  --   HCT 36.3* 37.2* 35.5*  --   PLT 148* 174 175  --   LABPROT 27.5* 20.6* 22.0*  --   INR 2.6* 1.8* 1.9*  --   HEPARINUNFRC  --   --  0.16* 0.30  CREATININE 1.26* 1.31* 1.25*  --      Estimated Creatinine Clearance: 60.2 mL/min (A) (by C-G formula based on SCr of 1.25 mg/dL (H)).   Medical History: Past Medical History:  Diagnosis Date   A-fib [facility])    a. (06/04/14) TEE-DC-CV; succesful; large LA 6.2 cm   A-fib (HCC)    Ascites    CHF (congestive heart failure) (HCC)    Chronic systolic heart failure (HCC)    a. ECHO (05/2014): EF 25-30%, diff HK, RV midly dilated and sys fx mildly/mod reduced   CKD (chronic kidney disease)    CKD (chronic kidney disease) stage 3, GFR 30-59 ml/min (HCC)    Depression    Diabetes mellitus without complication (HCC)    HTN (hypertension)    Hypertension    OSA (obstructive sleep apnea)    Type 2 diabetes mellitus [facility])     Assessment: 70 yo male with hx Afib and LV thrombus 10/21 on coumadin PTA. He is noted with PVD s/p angiogram with angioplasty.  Plans noted for surgery on L necrotic toe -heparin level= 0.3 on 1600 units/hr -Hg= 11.5  Goal of Therapy:  Heparin level 0.3-0.7 units/ml Monitor platelets by anticoagulation protocol: Yes   Plan:  -Increase heparin to 1700 units/hr to keep in goal -Heparin level in 6 hours and daily wth CBC daily  11/21, PharmD Clinical Pharmacist **Pharmacist phone directory can now be found on  [email or web address] ([email or web address]).  Listed under [facility].

## 2021-05-20 NOTE — Progress Notes (Signed)
[facility]  Subjective  -states his left foot feels better   Objective 115/86 80 98.6 F (37 C) (Oral) 15 96%  Intake/Output Summary (Last 24 hours) at 05/20/2021 0625 Last data filed at 05/20/2021 0409 Gross per 24 hour  Intake 1549.19 ml  Output 1025 ml  Net 524.19 ml    Right groin access site clean dry and intact Left peroneal signal triphasic at the ankle  Laboratory Lab Results: Recent Labs    05/19/21 0850 05/20/21 0331  WBC 9.7 8.6  HGB 12.5* 11.5*  HCT 37.2* 35.5*  PLT 174 175   BMET Recent Labs    05/18/21 0406 05/19/21 0850 05/20/21 0331  NA 134* 136  --   K 3.3* 3.6  --   CL 101 102  --   CO2 22 27  --   GLUCOSE 212* 199*  --   BUN 18 15  --   CREATININE 1.26* 1.31* 1.25*  CALCIUM 8.7* 8.7*  --     COAG Lab Results  Component Value Date   INR 1.9 (H) 05/20/2021   INR 1.8 (H) 05/19/2021   INR 2.6 (H) 05/18/2021   No results found for: PTT  Assessment/Planning:  70 year old male status post left peroneal artery angioplasty yesterday for critical limb ischemia with tissue loss.  He will need Plavix from my standpoint and 75 mg daily is ordered as well as statin.  I discussed with Dr. Lenell Antu who initially saw him in consultation and he plans to let Dr. Lajoyce Corners know that he has now undergone revascularization.  Will need plans for left 5th toe.  I think he had an excellent result and has a triphasic signal at the ankle.  [name] 05/20/2021 6:25 AM --

## 2021-05-20 NOTE — Evaluation (Signed)
Occupational Therapy Evaluation Patient Details Name: [name] MRN: [id number] DOB: 1951/08/14 Today's Date: 05/20/2021    History of Present Illness Pt adm 7/7 with necrotic lt fifth toe with maggots. Underwent arteriogram 7/10 s/p multiple lesions angioplastied. Management of necrotic toe pending. PMH - chf, htn, ckd,dm   Clinical Impression   Pt PTA: Pt living with daughter? Unable to obtain PLOF at this time due to pt being very vague. Pt currently, limited by low vision (keeping eyes closed throughout session), decreased ability to care for self, and increased pain in RLE from previous injury. Pt masking PLOF and current abilities with tangential topics and unable to full emerge in session. Pt requires max cues to perform any functional task. Pt set-upA to maxA for ADL and modA for transfers and minimal mobility with RW. Pt unable to properly care for self and requires higher level of care before d/c home.  Pt would benefit from continued OT skilled services. OT following acutely.  max HR 121 BPM with exertion; O2 >90% on RA.    Follow Up Recommendations  SNF    Equipment Recommendations  3 in 1 bedside commode    Recommendations for Other Services       Precautions / Restrictions Precautions Precautions: Fall;Other (comment) Precaution Comments: Very poor vision Restrictions Weight Bearing Restrictions: No      Mobility Bed Mobility Overal bed mobility: Needs Assistance Bed Mobility: Supine to Sit;Sit to Supine     Supine to sit: Mod assist Sit to supine: Min assist   General bed mobility comments: Assist to elevate trunk into sitting. Assist to bring legs back into bed returning to supine    Transfers Overall transfer level: Needs assistance Equipment used: Rolling walker (2 wheeled) Transfers: Sit to/from Stand Sit to Stand: Mod assist;From elevated surface         General transfer comment: Assist for hand placement. pt stating "let me do it  first."    Balance Overall balance assessment: Needs assistance Sitting-balance support: Bilateral upper extremity supported;Feet supported Sitting balance-Leahy Scale: Poor Sitting balance - Comments: UE support for static sitting   Standing balance support: Bilateral upper extremity supported Standing balance-Leahy Scale: Poor Standing balance comment: walker and min assist for static standing                           ADL either performed or assessed with clinical judgement   ADL Overall ADL's : Needs assistance/impaired Eating/Feeding: Set up;Sitting Eating/Feeding Details (indicate cue type and reason): difficulty seeing due to low vision in both eyes Grooming: Set up;Sitting;Cueing for safety   Upper Body Bathing: Set up;Sitting   Lower Body Bathing: Maximal assistance;Sit to/from stand;Sitting/lateral leans   Upper Body Dressing : Set up;Sitting   Lower Body Dressing: Maximal assistance;Sitting/lateral leans;Sit to/from stand;Cueing for safety   Toilet Transfer: Minimal assistance;Cueing for safety;Cueing for sequencing;Stand-pivot;BSC;RW Toilet Transfer Details (indicate cue type and reason): simulated; pt using RW for taking steps forward, back and side to side Toileting- Clothing Manipulation and Hygiene: Maximal assistance;Cueing for safety;Sitting/lateral lean;Sit to/from stand Toileting - Clothing Manipulation Details (indicate cue type and reason): requires assist, probably would not allow staff to assist     Functional mobility during ADLs: Minimal assistance;Rolling walker;Cueing for safety;Cueing for sequencing General ADL Comments: Pt limited by low vision (keeping eyes closed throughout session), decreased ability to care for self, and increased pain in RLE from previous injury. Pt masking PLOF and current abilities with tangential  topics and unable to full emerge in session. Pt requires max cues to perform any functional task.     Vision Baseline  Vision/History: No visual deficits;Glaucoma;Macular Degeneration Patient Visual Report: No change from baseline Vision Assessment?: Vision impaired- to be further tested in functional context Additional Comments: Unable to test vision as pt keeping eyes closed and not able to read with por vision reporting diabetic retinopathy and glaucoma. Pt reports "it's like looking out of Saran wrap in my R eye and wax paper in my L eye." Pt unwilling to scan items in room at this time. Plan for more visual learning with subsequent sessions.     Perception     Praxis      Pertinent Vitals/Pain Pain Assessment: Faces Faces Pain Scale: Hurts even more Pain Location: rt knee and lt foot Pain Descriptors / Indicators: Guarding;Grimacing Pain Intervention(s): Monitored during session     Hand Dominance Right   Extremity/Trunk Assessment Upper Extremity Assessment Upper Extremity Assessment: Generalized weakness   Lower Extremity Assessment Lower Extremity Assessment: RLE deficits/detail;LLE deficits/detail RLE Deficits / Details: Limited by pain and by chronic weakness due to prior injury LLE Deficits / Details: Limited by pain; necrotic 5th digit   Cervical / Trunk Assessment Cervical / Trunk Assessment: Normal   Communication Communication Communication: No difficulties   Cognition Arousal/Alertness: Awake/alert Behavior During Therapy: Flat affect Overall Cognitive Status: No family/caregiver present to determine baseline cognitive functioning                                 General Comments: Pt follows one step commands after max cues to decrease tangential speech.   General Comments  HR up to 121 with exertion. Pt on RA.    Exercises     Shoulder Instructions      Home Living Family/patient expects to be discharged to:: Private residence Living Arrangements: Children                               Additional Comments: Pt reported "to live with his  daughter." Pt unwilling to provide PLOF at this time.      Prior Functioning/Environment Level of Independence: Needs assistance  Gait / Transfers Assistance Needed: Pt not answering question directly. He was ambulatory but unsure if he used assistive device he said if he could find one.              OT Problem List: Decreased strength;Decreased range of motion;Decreased activity tolerance;Impaired balance (sitting and/or standing);Impaired vision/perception;Decreased coordination;Decreased cognition;Decreased safety awareness;Pain;Obesity;Increased edema;Decreased knowledge of use of DME or AE      OT Treatment/Interventions: Self-care/ADL training;Therapeutic exercise;Energy conservation;DME and/or AE instruction;Therapeutic activities;Patient/family education;Balance training    OT Goals(Current goals can be found in the care plan section) Acute Rehab OT Goals Patient Stated Goal: Did not state OT Goal Formulation: With patient Time For Goal Achievement: 06/03/21 Potential to Achieve Goals: Fair ADL Goals Pt Will Perform Grooming: with min guard assist;standing Pt Will Perform Lower Body Dressing: with min assist;sitting/lateral leans;sit to/from stand Pt Will Transfer to Toilet: with min assist;ambulating;bedside commode Pt/caregiver will Perform Home Exercise Program: Both right and left upper extremity;With Supervision Additional ADL Goal #1: Pt will require <3 cues for redirection of tangential topics in order to attend to functional tasks throughout session. Additional ADL Goal #2: Pt will increase to x3 mins of standing tolerance with minguardA  overall for stability with least restrictive assistive device.  OT Frequency: Min 2X/week   Barriers to D/C: Decreased caregiver support  unsure of care at home; pt with visual deficits and inability to properly see his necrotic toe and to see that maggots were in it.       Co-evaluation              AM-PAC OT "6 Clicks"  Daily Activity     Outcome Measure Help from another person eating meals?: None Help from another person taking care of personal grooming?: A Little Help from another person toileting, which includes using toliet, bedpan, or urinal?: A Lot Help from another person bathing (including washing, rinsing, drying)?: A Lot Help from another person to put on and taking off regular upper body clothing?: A Little Help from another person to put on and taking off regular lower body clothing?: A Lot 6 Click Score: 16   End of Session Equipment Utilized During Treatment: Gait belt;Rolling walker Nurse Communication: Mobility status  Activity Tolerance: Patient limited by pain Patient left: in bed;with call bell/phone within reach;with bed alarm set  OT Visit Diagnosis: Unsteadiness on feet (R26.81);Muscle weakness (generalized) (M62.81)                Time: 6203-5597 OT Time Calculation (min): 29 min Charges:  OT General Charges $OT Visit: 1 Visit OT Evaluation $OT Eval Moderate Complexity: 1 Mod OT Treatments $Therapeutic Activity: 8-22 mins  Flora Lipps, OTR/L Acute Rehabilitation Services Pager: [phone] Office: [phone]   Keir Viernes C 05/20/2021, 5:12 PM

## 2021-05-21 ENCOUNTER — Inpatient Hospital Stay (HOSPITAL_COMMUNITY): Payer: Medicare HMO

## 2021-05-21 LAB — CBC
HCT: 35.5 % — ABNORMAL LOW (ref 39.0–52.0)
Hemoglobin: 11.2 g/dL — ABNORMAL LOW (ref 13.0–17.0)
MCH: 29.3 pg (ref 26.0–34.0)
MCHC: 31.5 g/dL (ref 30.0–36.0)
MCV: 92.9 fL (ref 80.0–100.0)
Platelets: 135 10*3/uL — ABNORMAL LOW (ref 150–400)
RBC: 3.82 MIL/uL — ABNORMAL LOW (ref 4.22–5.81)
RDW: 13.7 % (ref 11.5–15.5)
WBC: 8.3 10*3/uL (ref 4.0–10.5)
nRBC: 0 % (ref 0.0–0.2)

## 2021-05-21 LAB — CULTURE, BLOOD (ROUTINE X 2)
Culture: NO GROWTH
Culture: NO GROWTH
Special Requests: ADEQUATE
Special Requests: ADEQUATE

## 2021-05-21 LAB — PROTIME-INR
INR: 1.7 — ABNORMAL HIGH (ref 0.8–1.2)
Prothrombin Time: 20.3 seconds — ABNORMAL HIGH (ref 11.4–15.2)

## 2021-05-21 LAB — BASIC METABOLIC PANEL
Anion gap: 9 (ref 5–15)
BUN: 13 mg/dL (ref 8–23)
CO2: 20 mmol/L — ABNORMAL LOW (ref 22–32)
Calcium: 8.2 mg/dL — ABNORMAL LOW (ref 8.9–10.3)
Chloride: 105 mmol/L (ref 98–111)
Creatinine, Ser: 1.17 mg/dL (ref 0.61–1.24)
GFR, Estimated: >60 mL/min (ref >60)
Glucose, Bld: 253 mg/dL — ABNORMAL HIGH (ref 70–99)
Potassium: 3.7 mmol/L (ref 3.5–5.1)
Sodium: 134 mmol/L — ABNORMAL LOW (ref 135–145)

## 2021-05-21 LAB — HEPARIN LEVEL (UNFRACTIONATED): Heparin Unfractionated: 0.36 IU/mL (ref 0.30–0.70)

## 2021-05-21 LAB — GLUCOSE, CAPILLARY
Glucose-Capillary: 235 mg/dL — ABNORMAL HIGH (ref 70–99)
Glucose-Capillary: 176 mg/dL — ABNORMAL HIGH (ref 70–99)
Glucose-Capillary: 188 mg/dL — ABNORMAL HIGH (ref 70–99)
Glucose-Capillary: 149 mg/dL — ABNORMAL HIGH (ref 70–99)

## 2021-05-21 MED ORDER — SODIUM CHLORIDE 0.9 % IV SOLN
2.0000 g | Freq: Three times a day (TID) | INTRAVENOUS | Status: DC
  Administered 2021-05-21 – 2021-05-24 (×7): 2 g via INTRAVENOUS
  Filled 2021-05-21 (×7): qty 2

## 2021-05-21 MED ORDER — INSULIN GLARGINE 100 UNIT/ML ~~LOC~~ SOLN
8.0000 [IU] | Freq: Every day | SUBCUTANEOUS | Status: DC
  Administered 2021-05-21 – 2021-05-29 (×9): 8 [IU] via SUBCUTANEOUS
  Filled 2021-05-21 (×9): qty 0.08

## 2021-05-21 NOTE — TOC Progression Note (Signed)
Transition of Care [facility]) - Progression Note    Patient Details  Name: [name] MRN: [id number] Date of Birth: 1951-06-30  [facility]) CM/SW Contact  Leone Haven, RN Phone Number: 05/21/2021, 3:24 PM  Clinical Narrative:    NCM spoke with patient, he states his daughgter lives with him. She takes him to his MD apts. He does not drive.  He states he also goes to a coumadin clinic.  He daughter is Alana [phone].  The plan is for surgery tomorrow and pt rec SNF, patiet states he is in agreement with that, CSW aware.   Expected Discharge Plan: Home w Home Health Services Barriers to Discharge: Continued Medical Work up  Expected Discharge Plan and Services Expected Discharge Plan: Home w Home Health Services   Discharge Planning Services: CM Consult   Living arrangements for the past 2 months: Single Family Home                                       Social Determinants of Health (SDOH) Interventions    Readmission Risk Interventions No flowsheet data found.

## 2021-05-21 NOTE — NC FL2 (Signed)
[place] MEDICAID FL2 LEVEL OF CARE SCREENING TOOL     IDENTIFICATION  Patient Name: [name] Birthdate: [birth year] Sex: male Admission Date (Current Location): 05/15/2021  [facility] and [place] Number:  Producer, television/film/video and Address:  The . [facility], [address] [place], [place] [address]      Provider Number: [id number]  Attending Physician Name and Address:  Alba Cory, MD  Relative Name and Phone Number:  Yoshito, [place] (Daughter)   [phone]    Current Level of Care: Hospital Recommended Level of Care: Skilled Nursing Facility Prior Approval Number:    Date Approved/Denied:   PASRR Number:    Discharge Plan: SNF    Current Diagnoses: Patient Active Problem List   Diagnosis Date Noted   Toe necrosis (HCC) 05/16/2021   Acute-on-chronic kidney injury (HCC) 05/16/2021   CHF (congestive heart failure) (HCC) 05/16/2021   Atrial fibrillation (HCC) 05/16/2021   Neck abscess 05/14/2016   Palliative care encounter 03/18/2016   Atrial fibrillation with rapid ventricular response (HCC) 03/14/2016   Noncompliance with medication regimen 03/14/2016   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   Hypertensive heart disease with heart failure (HCC)    Elevated troponin    Atrial flutter (HCC) 12/28/2015   Encounter for therapeutic drug monitoring 05/24/2014   HTN (hypertension) 05/21/2014   Chronic systolic heart failure (HCC) 05/15/2014   Obstructive sleep apnea 05/13/2014   Swelling of lower extremity 05/09/2014   Depression 05/09/2014   Type 2 diabetes, uncontrolled, with renal manifestation (HCC) 05/09/2014   CKD (chronic kidney disease), stage III (HCC) 05/09/2014   Obesity (BMI 30-39.9) 05/09/2014   Ascites 05/09/2014    Orientation RESPIRATION BLADDER Height & Weight     Self, Time, Situation, Place  Normal Continent, External catheter Weight: 200 lb 9.9 oz (91 kg) Height:  5\' 8"  (172.7 cm)  BEHAVIORAL SYMPTOMS/MOOD  NEUROLOGICAL BOWEL NUTRITION STATUS      Continent Diet (See DC Summary)  AMBULATORY STATUS COMMUNICATION OF NEEDS Skin   Extensive Assist Verbally Other (Comment) (L Black necrotic, skin dry cracking)                       Personal Care Assistance Level of Assistance  Bathing, Feeding, Dressing Bathing Assistance: Maximum assistance Feeding assistance: Independent Dressing Assistance: Maximum assistance     Functional Limitations Info  Sight, Hearing, Speech Sight Info: Impaired Hearing Info: Adequate Speech Info: Adequate    SPECIAL CARE FACTORS FREQUENCY  PT (By licensed PT), OT (By licensed OT)     PT Frequency: 5x a week OT Frequency: 5x a week            Contractures Contractures Info: Not present    Additional Factors Info  Code Status, Allergies Code Status Info: Full Allergies Info: NKA           Current Medications (05/21/2021):  This is the current hospital active medication list Current Facility-Administered Medications  Medication Dose Route Frequency Provider Last Rate Last Admin   0.9 %  sodium chloride infusion  250 mL Intravenous PRN 05/23/2021, MD       acetaminophen (TYLENOL) tablet 650 mg  650 mg Oral Q6H PRN Cephus Shelling, MD   650 mg at 05/18/21 2023   Or   acetaminophen (TYLENOL) suppository 650 mg  650 mg Rectal Q6H PRN 2024, MD       acetaminophen (TYLENOL) tablet 650 mg  650 mg Oral Q4H PRN  Cephus Shelling, MD       atorvastatin (LIPITOR) tablet 10 mg  10 mg Oral Daily Cephus Shelling, MD   10 mg at 05/21/21 7121   ceFEPIme (MAXIPIME) 2 g in sodium chloride 0.9 % 100 mL IVPB  2 g Intravenous Q8H Marja Kays, RPH 200 mL/hr at 05/21/21 1548 2 g at 05/21/21 1548   clopidogrel (PLAVIX) tablet 75 mg  75 mg Oral Q breakfast Cephus Shelling, MD   75 mg at 05/21/21 9758   furosemide (LASIX) tablet 40 mg  40 mg Oral BID Cephus Shelling, MD   40 mg at 05/21/21 0811   heparin ADULT infusion  100 units/mL (25000 units/236mL)  1,700 Units/hr Intravenous Continuous Silvana Newness, RPH 17 mL/hr at 05/21/21 0416 1,700 Units/hr at 05/21/21 0416   hydrALAZINE (APRESOLINE) injection 5 mg  5 mg Intravenous Q20 Min PRN Cephus Shelling, MD       hydrOXYzine (ATARAX/VISTARIL) tablet 10 mg  10 mg Oral TID PRN Cephus Shelling, MD   10 mg at 05/18/21 2215   insulin aspart (novoLOG) injection 0-15 Units  0-15 Units Subcutaneous TID WC Cephus Shelling, MD   3 Units at 05/21/21 1153   insulin glargine (LANTUS) injection 8 Units  8 Units Subcutaneous Daily Regalado, Belkys A, MD   8 Units at 05/21/21 0938   labetalol (NORMODYNE) injection 10 mg  10 mg Intravenous Q10 min PRN Cephus Shelling, MD       metoprolol succinate (TOPROL-XL) 24 hr tablet 100 mg  100 mg Oral BID Cephus Shelling, MD   100 mg at 05/21/21 0938   metroNIDAZOLE (FLAGYL) IVPB 500 mg  500 mg Intravenous Q8H Cephus Shelling, MD 100 mL/hr at 05/21/21 1427 500 mg at 05/21/21 1427   ondansetron (ZOFRAN) injection 4 mg  4 mg Intravenous Q6H PRN Cephus Shelling, MD       ondansetron [facility]) injection 4 mg  4 mg Intravenous Q6H PRN Cephus Shelling, MD       sodium chloride flush (NS) 0.9 % injection 3 mL  3 mL Intravenous Q12H Cephus Shelling, MD   3 mL at 05/19/21 2109   sodium chloride flush (NS) 0.9 % injection 3 mL  3 mL Intravenous PRN Cephus Shelling, MD         Discharge Medications: Please see discharge summary for a list of discharge medications.  Relevant Imaging Results:  Relevant Lab Results:   Additional Information SSN: [id number];  Ivette Loyal, Theresia Majors

## 2021-05-21 NOTE — TOC Initial Note (Addendum)
Transition of Care [facility]) - Initial/Assessment Note    Patient Details  Name: [name] MRN: [id number] Date of Birth: 07/29/1951  Transition of Care [facility]) CM/SW Contact:    Tresa Endo Phone Number: 05/21/2021, 3:38 PM  Clinical Narrative:     CSW received SNF consult. CSW met with pt at bedside. CSW introduced self and explained role at the hospital. Pt reports that PTA the pt lived at home with his daughter. PT reports pt was able to walk but with some leg weakness. PT reports pt used rolling walker during session. Pt came in with necrotic L fifth toe with maggots.  CSW reviewed PT/OT recommendations for SNF. Pt reports that he understands he needs to go to a SNF for rehab because of his multiple health problems that are hindering his ability to walk and upcoming surgery. Pt gave CSW permission to fax out to facilities in the area. Pt has no preference of facility at this time. CSW gave pt medicare.gov rating list to review. Pt has no covid vaccines listed.  CSW will continue to follow.               Pt from home and is scheduled for an x-ray today and surgery tomorrow morning. Physical Therapy is suggesting SNF, pt and daughter are aware of suggestion. Pt has never been to a SNF, CSW will follow up with pt on DC process to SNF. Pt landlord Harlin Heys contacted CSW to inquire about pt need for ramp after SNF. CSW will follow up with RN, pt may not need ramp after rehab.  Pt would like CSW to speak with his daughter to make a decision for SNF after offers are back.  Pt faxed out  Expected Discharge Plan: [place] Barriers to Discharge: Continued Medical Work up   Patient Goals and CMS Choice        Expected Discharge Plan and Services Expected Discharge Plan: [place]   Discharge Planning Services: CM Consult   Living arrangements for the past 2 months: Single Family Home                                       Prior Living Arrangements/Services Living arrangements for the past 2 months: Single Family Home Lives with:: Self Patient language and need for interpreter reviewed:: Yes        Need for Family Participation in Patient Care: Yes (Comment) Care giver support system in place?: Yes (comment)   Criminal Activity/Legal Involvement Pertinent to Current Situation/Hospitalization: No - Comment as needed  Activities of Daily Living Home Assistive Devices/Equipment: None ADL Screening (condition at time of admission) Patient's cognitive ability adequate to safely complete daily activities?: Yes Is the patient deaf or have difficulty hearing?: No Does the patient have difficulty seeing, even when wearing glasses/contacts?: No Does the patient have difficulty concentrating, remembering, or making decisions?: No Patient able to express need for assistance with ADLs?: Yes Does the patient have difficulty dressing or bathing?: No Independently performs ADLs?: Yes (appropriate for developmental age) Does the patient have difficulty walking or climbing stairs?: No Weakness of Legs: Left Weakness of Arms/Hands: None  Permission Sought/Granted                  Emotional Assessment       Orientation: : Oriented to Self, Oriented to Place, Oriented to  Time, Oriented to Situation Alcohol / Substance Use: Not Applicable Psych Involvement: No (comment)  Admission diagnosis:  Toe infection [L08.9] Necrotic toes (HCC) [I96] Patient Active Problem List   Diagnosis Date Noted   Toe necrosis ([place]) 05/16/2021   Acute-on-chronic kidney injury ([place]) 05/16/2021   CHF (congestive heart failure) ([place]) 05/16/2021   Atrial fibrillation ([place]) 05/16/2021   Neck abscess 05/14/2016   Palliative care encounter 03/18/2016   Atrial fibrillation with rapid ventricular response ([place]) 03/14/2016   Noncompliance with medication regimen 03/14/2016   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   Hypertensive heart  disease with heart failure (HCC)    Elevated troponin    Atrial flutter ([place]) 12/28/2015   Encounter for therapeutic drug monitoring 05/24/2014   HTN (hypertension) 44/81/8563   Chronic systolic heart failure ([place]) 05/15/2014   Obstructive sleep apnea 05/13/2014   Swelling of lower extremity 05/09/2014   Depression 05/09/2014   Type 2 diabetes, uncontrolled, with renal manifestation ([place]) 05/09/2014   CKD (chronic kidney disease), stage III ([place]) 05/09/2014   Obesity (BMI 30-39.9) 05/09/2014   Ascites 05/09/2014   PCP:  Patient, No Pcp Per (Inactive) Pharmacy:   [facility] [address] [place] [address] Phone: [phone] Fax: [phone]     Social Determinants of Health (SDOH) Interventions    Readmission Risk Interventions No flowsheet data found.

## 2021-05-21 NOTE — Progress Notes (Addendum)
ANTICOAGULATION CONSULT NOTE - Follow Up Consult  Pharmacy Consult for IV Heparin Indication: atrial fibrillation and LV thrombus  No Known Allergies  Patient Measurements: Height: 5\' 8"  (172.7 cm) Weight: 91 kg (200 lb 9.9 oz) IBW/kg (Calculated) : 68.4 Heparin Dosing Weight: 87 kg  Vital Signs: Temp: 98.9 F (37.2 C) (07/13 0300) Temp Source: Oral (07/13 0300) BP: 148/75 (07/13 0300) Pulse Rate: 86 (07/13 0300)  Labs: Recent Labs    05/19/21 0850 05/19/21 0850 05/20/21 0331 05/20/21 1141 05/20/21 1932 05/21/21 0043  HGB 12.5*  --  11.5*  --   --  11.2*  HCT 37.2*  --  35.5*  --   --  35.5*  PLT 174  --  175  --   --  135*  LABPROT 20.6*  --  22.0*  --   --  20.3*  INR 1.8*  --  1.9*  --   --  1.7*  HEPARINUNFRC  --    < > 0.16* 0.30 0.40 0.36  CREATININE 1.31*  --  1.25*  --   --  1.17   < > = values in this interval not displayed.    Estimated Creatinine Clearance: 64.3 mL/min (by C-G formula based on SCr of 1.17 mg/dL).   Medications:  Infusions:   sodium chloride     ceFEPime (MAXIPIME) IV 2 g (05/20/21 2313)   heparin 1,700 Units/hr (05/21/21 0416)   metronidazole 500 mg (05/21/21 0418)    Assessment: 70 yo m with afib and LV thrombus (PTA coumadin). Plans for surgery for amputation of necrotic left toe. HL 0.36, therapeutic.   Nurse noted no issues with infusion or s/s of bleeding.  Goal of Therapy:  Heparin level 0.3-0.7 units/ml Monitor platelets by anticoagulation protocol: Yes   Plan:  Continue heparin 1700 units/hr Daily HL and CBC  66 Danecia Underdown 05/21/2021,8:34 AM

## 2021-05-21 NOTE — Care Management Important Message (Signed)
Important Message  Patient Details  Name: [name] MRN: [id number] Date of Birth: 30-Nov-1950   Medicare Important Message Given:  Yes     Dorena Bodo 05/21/2021, 3:21 PM

## 2021-05-21 NOTE — Progress Notes (Signed)
PROGRESS NOTE    [name]  ZOX:[id number] DOB: August 16, 1951 DOA: 05/15/2021 PCP: Patient, No Pcp Per (Inactive)   Brief Narrative: 70 year old with past medical history significant for chronic combined CHF, hypertension, PAF on Coumadin, left ventricular thrombus, CKD stage IIIa, diabetes who presents complaining of left pinky toe turning black.  He was found to have necrosis with maggots, was febrile with temperature to 100. 3 and admitted to the hospital.  Orthopedic and vascular surgery consulted.  Patient underwent arteriogram 7/10 with multiple lesions all angioplastied.  Patient awaiting amputation of left big toe.    Assessment & Plan:   Principal Problem:   Toe necrosis (HCC) Active Problems:   HTN (hypertension)   Acute-on-chronic kidney injury (HCC)   CHF (congestive heart failure) (HCC)   Atrial fibrillation (HCC)   1-Necrotic left fifth toe: Orthopedic and vascular surgery consulted. Underwent arteriogram 7/10 status post multiple lesions all angioplastied  with excellent result and no residual stenosis.  Continue with  IV cefepime and Flagyl. Patient was a started on Plavix post procedure Awaiting amputation  2-AKI on CKD stage III A: Baseline creatinine 1.2--1.3. Continue to monitor  3-chronic combined systolic and diastolic heart failure Echo ejection fraction 15 to 20%, has recovered in October 2021 to ejection fraction 60% Continue with Lasix  Hypertension: Continue with metoprolol and Lasix  Paroxysmal A. fib: Continue with heparin drip resume Coumadin when okay by vascular  History of left ventricular thrombus: Most recent echo negative for thrombus  Diabetes type 2: Start low-dose Lantus.  Continue with correction insulin  Right Knee pain; Plan to check X ray.  Mild thrombocytopenia. Monitor.   Estimated body mass index is 30.5 kg/m as calculated from the following:   Height as of this encounter: 5\' 8"  (1.727 m).   Weight as of this  encounter: 91 kg.   DVT prophylaxis: Heparin drip Code Status: Full code Family Communication: Will update daughter today Disposition Plan:  Status is: Inpatient  Remains inpatient appropriate because:IV treatments appropriate due to intensity of illness or inability to take PO  Dispo: The patient is from: Home              Anticipated d/c is to: SNF              Patient currently is not medically stable to d/c.   Difficult to place patient No        Consultants:  Ortho Vascular  Procedures:    Antimicrobials:    Subjective: He reports chronic blurry vision.  He is supposed to follow with his ophthalmology He is complaining of mild right knee pain feels pain is getting better  Objective: Vitals:   05/20/21 1137 05/20/21 1940 05/20/21 2300 05/21/21 0300  BP: 125/90 (!) 148/85 (!) 144/93 (!) 148/75  Pulse: (!) 101 (!) 106 84 86  Resp: 16 20 13 13   Temp: 98.8 F (37.1 C) 98.7 F (37.1 C) 98.5 F (36.9 C) 98.9 F (37.2 C)  TempSrc: Oral Oral Oral Oral  SpO2: 97% 95% 95% 95%  Weight:      Height:        Intake/Output Summary (Last 24 hours) at 05/21/2021 0711 Last data filed at 05/21/2021 0418 Gross per 24 hour  Intake 440 ml  Output 800 ml  Net -360 ml   Filed Weights   05/16/21 0129  Weight: 91 kg    Examination:  General exam: Appears calm and comfortable  Respiratory system: Clear to auscultation. Respiratory effort normal. Cardiovascular  system: S1 & S2 heard, RRR. No JVD, murmurs, rubs, gallops or clicks. No pedal edema. Gastrointestinal system: Abdomen is nondistended, soft and nontender. No organomegaly or masses felt. Normal bowel sounds heard. Central nervous system: Alert and oriented. No focal neurological deficits. Extremities: Symmetric 5 x 5 power. Left fifth toe necrotic.    Data Reviewed: I have personally reviewed following labs and imaging studies  CBC: Recent Labs  Lab 05/15/21 1853 05/16/21 0310 05/18/21 0406  05/19/21 0850 05/20/21 0331 05/21/21 0043  WBC 10.7* 8.3 9.4 9.7 8.6 8.3  NEUTROABS 7.8*  --   --   --   --   --   HGB 13.2 12.4* 12.1* 12.5* 11.5* 11.2*  HCT 40.2 37.8* 36.3* 37.2* 35.5* 35.5*  MCV 89.1 88.3 87.7 86.9 89.9 92.9  PLT 211 161 148* 174 175 135*   Basic Metabolic Panel: Recent Labs  Lab 05/15/21 1853 05/16/21 0310 05/17/21 0129 05/18/21 0406 05/19/21 0850 05/20/21 0331 05/21/21 0043  NA 134* 134*  --  134* 136  --  134*  K 3.6 3.5  --  3.3* 3.6  --  3.7  CL 95* 95*  --  101 102  --  105  CO2 28 31  --  22 27  --  20*  GLUCOSE 308* 354*  --  212* 199*  --  253*  BUN 25* 21  --  18 15  --  13  CREATININE 1.65* 1.59* 1.38* 1.26* 1.31* 1.25* 1.17  CALCIUM 8.9 8.9  --  8.7* 8.7*  --  8.2*   GFR: Estimated Creatinine Clearance: 64.3 mL/min (by C-G formula based on SCr of 1.17 mg/dL). Liver Function Tests: Recent Labs  Lab 05/15/21 1853  AST 16  ALT 23  ALKPHOS 42  BILITOT 1.1  PROT 7.9  ALBUMIN 3.9   No results for input(s): LIPASE, AMYLASE in the last 168 hours. No results for input(s): AMMONIA in the last 168 hours. Coagulation Profile: Recent Labs  Lab 05/17/21 0809 05/18/21 0406 05/19/21 0850 05/20/21 0331 05/21/21 0043  INR 4.0* 2.6* 1.8* 1.9* 1.7*   Cardiac Enzymes: No results for input(s): CKTOTAL, CKMB, CKMBINDEX, TROPONINI in the last 168 hours. BNP (last 3 results) No results for input(s): PROBNP in the last 8760 hours. HbA1C: No results for input(s): HGBA1C in the last 72 hours. CBG: Recent Labs  Lab 05/20/21 0543 05/20/21 1134 05/20/21 2044 05/20/21 2108 05/21/21 0535  GLUCAP 176* 252* 166* 233* 235*   Lipid Profile: No results for input(s): CHOL, HDL, LDLCALC, TRIG, CHOLHDL, LDLDIRECT in the last 72 hours. Thyroid Function Tests: No results for input(s): TSH, T4TOTAL, FREET4, T3FREE, THYROIDAB in the last 72 hours. Anemia Panel: No results for input(s): VITAMINB12, FOLATE, FERRITIN, TIBC, IRON, RETICCTPCT in the last 72  hours. Sepsis Labs: Recent Labs  Lab 05/15/21 1853 05/16/21 0310  LATICACIDVEN 2.0* 2.1*    Recent Results (from the past 240 hour(s))  SARS CORONAVIRUS 2 (TAT 6-24 HRS) Nasopharyngeal Nasopharyngeal Swab     Status: None   Collection Time: 05/15/21 11:23 PM   Specimen: Nasopharyngeal Swab  Result Value Ref Range Status   SARS Coronavirus 2 NEGATIVE NEGATIVE Final    Comment: (NOTE) SARS-CoV-2 target nucleic acids are NOT DETECTED.  The SARS-CoV-2 RNA is generally detectable in upper and lower respiratory specimens during the acute phase of infection. Negative results do not preclude SARS-CoV-2 infection, do not rule out co-infections with other pathogens, and should not be used as the sole basis for treatment or other  patient management decisions. Negative results must be combined with clinical observations, patient history, and epidemiological information. The expected result is Negative.  Fact Sheet for Patients: HairSlick.no  Fact Sheet for Healthcare Providers: quierodirigir.com  This test is not yet approved or cleared by the [place] FDA and  has been authorized for detection and/or diagnosis of SARS-CoV-2 by FDA under an Emergency Use Authorization (EUA). This EUA will remain  in effect (meaning this test can be used) for the duration of the COVID-19 declaration under Se ction 564(b)(1) of the Act, 21 U.S.C. section 360bbb-3(b)(1), unless the authorization is terminated or revoked sooner.  Performed at [facility], [address], [place], [place] [address]   Culture, blood (routine x 2)     Status: None (Preliminary result)   Collection Time: 05/16/21  3:10 AM   Specimen: BLOOD  Result Value Ref Range Status   Specimen Description BLOOD SITE NOT SPECIFIED  Final   Special Requests   Final    BOTTLES DRAWN AEROBIC AND ANAEROBIC Blood Culture adequate volume   Culture   Final    NO GROWTH 4  DAYS Performed at [facility], [address], [place], [place] [address]    Report Status PENDING  Incomplete  Culture, blood (routine x 2)     Status: None (Preliminary result)   Collection Time: 05/16/21  3:10 AM   Specimen: BLOOD  Result Value Ref Range Status   Specimen Description BLOOD SITE NOT SPECIFIED  Final   Special Requests   Final    BOTTLES DRAWN AEROBIC AND ANAEROBIC Blood Culture adequate volume   Culture   Final    NO GROWTH 4 DAYS Performed at [facility], [address], [place], [place] [address]    Report Status PENDING  Incomplete  MRSA Next Gen by PCR, Nasal     Status: None   Collection Time: 05/18/21 11:15 AM   Specimen: Nasal Mucosa; Nasal Swab  Result Value Ref Range Status   MRSA by PCR Next Gen NOT DETECTED NOT DETECTED Final    Comment: (NOTE) The GeneXpert MRSA Assay (FDA approved for NASAL specimens only), is one component of a comprehensive MRSA colonization surveillance program. It is not intended to diagnose MRSA infection nor to guide or monitor treatment for MRSA infections. Test performance is not FDA approved in patients less than 49 years old. Performed at [facility], [address] [place], [place] [address]   Surgical pcr screen     Status: None   Collection Time: 05/19/21  8:42 AM   Specimen: Nasal Mucosa; Nasal Swab  Result Value Ref Range Status   MRSA, PCR NEGATIVE NEGATIVE Final   Staphylococcus aureus NEGATIVE NEGATIVE Final    Comment: (NOTE) The Xpert SA Assay (FDA approved for NASAL specimens in patients 57 years of age and older), is one component of a comprehensive surveillance program. It is not intended to diagnose infection nor to guide or monitor treatment. Performed at [facility], [address], [place], [place] [address]          Radiology Studies: PERIPHERAL VASCULAR CATHETERIZATION  Result Date: 05/19/2021 Formatting of this result is different from the original. Patient  name: [name]      MRN: [id number]        DOB: 1951/01/02        Sex: male   05/19/2021 Pre-operative Diagnosis: Critical limb ischemia left lower extremity with left fifth toe  gangrene Post-operative diagnosis:  Same Surgeon:  Cephus Shelling, MD Procedure Performed: 1.  Ultrasound-guided access of right common femoral artery 2.  Aortogram including catheter selection of aorta 3.  Bilateral lower extremity arteriogram with selection third order branches left lower extremity 4.  Left peroneal angioplasty (3 mm x 40 mm Sterling) 5.  Mynx closure of the right common femoral artery 6.  43 minutes of monitored moderate conscious sedation time   Indications: Patient is a 70 year old male who was seen in consultation over the weekend by Dr. Lenell Antu with left fifth toe gangrene and ischemic changes.  He had abnormal monophasic waveforms at the ankle.  He presents today for aortogram, left lower extremity arteriogram, and possible intervention after risk benefits discussed.   Findings: Aortogram showed patent renal arteries bilaterally with no flow-limiting stenosis in the aortoiliac segment.  The left lower extremity which is the side of interest showed a patent common femoral, profunda, SFA, above and below-knee popliteal artery without flow-limiting stenosis.  He has severe tibial disease with only peroneal runoff.  There was a 50% stenosis in the proximal peroneal, an 80% focal stenosis in the mid peroneal, and a short subtotal occlusion of the distal peroneal.  Th peroneal did reconstitute a PT at the ankle.  The peroneal lesion was then crossed and all lesions were angioplastied with 3 mm Sterling with excellent results and no residual stenosis.   Right lower extremity also shows patent common femoral, profunda, SFA, popliteal with tibial disease and only single peroneal runoff but also has a high-grade stenosis in the distal vessel reconstituting the posterior tibial             Procedure:  The patient  was identified in the holding area and taken to room 8.  The patient was then placed supine on the table and prepped and draped in the usual sterile fashion.  A time out was called.  Ultrasound was used to evaluate the right common femoral artery.  It was patent .  A digital ultrasound image was acquired.  A micropuncture needle was used to access the right common femoral artery under ultrasound guidance.  An 018 wire was advanced without resistance and a micropuncture sheath was placed.  The 018 wire was removed and a benson wire was placed.  The micropuncture sheath was exchanged for a 5 french sheath.  An omniflush catheter was advanced over the wire to the level of L-1.  An abdominal angiogram was obtained.  Next the catheter was pulled down and bilateral lower extremity runoff was obtained.  After evaluating images elected to intervene on the left peroneal artery.  I crossed the aortic bifurcation and placed my catheter into the left SFA where then placed a Rosen wire we exchanged for a long 5 French Ansell sheath in the right groin over the aortic bifurcation.  Patient was given 100 units/kg IV heparin.  I then used a long straight 100 cm flush catheter to get better dedicated images of the left peroneal artery.  We identified 3 lesions as noted above.  Lesions were then all crossed with a V 18 wire.  I then angioplastied each individual lesion with a 3 mm x 40 mm Sterling to nominal pressure for 2 minutes each.  There was no residual stenosis or dissection with a widely patent vessel.  Satisfied the results wires and catheters were removed.  I exchanged for a short 5 French sheath in the right groin.  Mynx closure device was deployed.  Plan: Patient will be loaded on Plavix.  He now has inline flow and is optimized.   Cephus Shelling, MD [facility] [place] Office: [phone]       Scheduled Meds:  atorvastatin  10 mg Oral Daily   clopidogrel  75 mg Oral Q breakfast    furosemide  40 mg Oral BID   insulin aspart  0-15 Units Subcutaneous TID WC   metoprolol succinate  100 mg Oral BID   sodium chloride flush  3 mL Intravenous Q12H   Continuous Infusions:  sodium chloride 100 mL/hr at 05/21/21 0357   sodium chloride     ceFEPime (MAXIPIME) IV 2 g (05/20/21 2313)   heparin 1,700 Units/hr (05/21/21 0416)   metronidazole 500 mg (05/21/21 0418)     LOS: 6 days    Time spent: 35 minutes.     Alba Cory, MD [facility]   If 7PM-7AM, please contact night-coverage [email or web address]  05/21/2021, 7:11 AM

## 2021-05-21 NOTE — Progress Notes (Addendum)
  Progress Note    05/21/2021 7:59 AM 2 Days Post-Op  Subjective:  sleepy. No complaints   Vitals:   05/20/21 2300 05/21/21 0300  BP: (!) 144/93 (!) 148/75  Pulse: 84 86  Resp: 13 13  Temp: 98.5 F (36.9 C) 98.9 F (37.2 C)  SpO2: 95% 95%   Physical Exam: Cardiac:  regular Lungs:  non labored Extremities:  right common femoral access site c/d/I without swelling or hematoma. Left foot dressed. Doppler left Pt/ DP/ peroneal signals Neurologic: alert and oriented  CBC    Component Value Date/Time   WBC 8.3 05/21/2021 0043   RBC 3.82 (L) 05/21/2021 0043   HGB 11.2 (L) 05/21/2021 0043   HCT 35.5 (L) 05/21/2021 0043   PLT 135 (L) 05/21/2021 0043   MCV 92.9 05/21/2021 0043   MCH 29.3 05/21/2021 0043   MCHC 31.5 05/21/2021 0043   RDW 13.7 05/21/2021 0043   LYMPHSABS 1.7 05/15/2021 1853   MONOABS 0.9 05/15/2021 1853   EOSABS 0.1 05/15/2021 1853   BASOSABS 0.0 05/15/2021 1853    BMET    Component Value Date/Time   NA 134 (L) 05/21/2021 0043   K 3.7 05/21/2021 0043   CL 105 05/21/2021 0043   CO2 20 (L) 05/21/2021 0043   GLUCOSE 253 (H) 05/21/2021 0043   BUN 13 05/21/2021 0043   CREATININE 1.17 05/21/2021 0043   CALCIUM 8.2 (L) 05/21/2021 0043   GFRNONAA >60 05/21/2021 0043   GFRAA >60 08/06/2020 1418    INR    Component Value Date/Time   INR 1.7 (H) 05/21/2021 0043     Intake/Output Summary (Last 24 hours) at 05/21/2021 0759 Last data filed at 05/21/2021 0700 Gross per 24 hour  Intake 440 ml  Output 1100 ml  Net -660 ml     Assessment/Plan:  70 y.o. male is s/p revascularization with L peroneal angioplasty 2 Days Post-Op   LLE well perfused and warm with Doppler PT/ DP/ peroneal signals Right common femoral access site c/d/I without swelling or hematoma Plavix and statin Dr. Lenell Antu to discuss with Dr. Lajoyce Corners plans for L 5th toe wounds Optimized from vascular standpoint He will have follow up in 4 weeks with ABI and arterial duplex  Graceann Congress,  PA-C [facility] [phone] 05/21/2021 7:59 AM  I have seen and evaluated the patient. I agree with the PA note as documented above.  70 year old male status post left peroneal angioplasty for critical limb ischemia with tissue loss.  Excellent results and has triphasic peroneal signal at the ankle.  Apparently Dr. Lajoyce Corners is out of town this week after discussion with Dr. Lenell Antu.  We will schedule for left fifth toe amputation tomorrow in the OR as I discussed with the patient today.  Please keep n.p.o. after midnight.  Consent form placed in the chart.  I did discuss risk of nonhealing even with tibial intervention.  Cephus Shelling, MD [facility] [place] Office: [phone]

## 2021-05-21 NOTE — Progress Notes (Signed)
Pt O2 dropping to 70s while sleeping- applied O2 [place]  11/2L- maintaining 90s will continue to monitor

## 2021-05-21 NOTE — Progress Notes (Signed)
PHARMACY NOTE:  ANTIMICROBIAL RENAL DOSAGE ADJUSTMENT  Current antimicrobial regimen includes a mismatch between antimicrobial dosage and estimated renal function.  As per policy approved by the Pharmacy & Therapeutics and Medical Executive Committees, the antimicrobial dosage will be adjusted accordingly.  Current antimicrobial dosage:  cefepime 2 g q12h  Indication: necrotic toe   Renal Function: improved renal function during admission  Estimated Creatinine Clearance: 64.3 mL/min (by C-G formula based on SCr of 1.17 mg/dL). BL 1.2-1.3  Antimicrobial dosage has been changed to:  cefepime 2 g q8h   Thank you for allowing pharmacy to be a part of this patient's care.  Marja Kays, PharmD PGY1 Acute Care Resident  May 21, 2021

## 2021-05-21 NOTE — Plan of Care (Signed)
  Problem: Coping: Goal: Ability to adjust to condition or change in health will improve Outcome: Progressing   Problem: Fluid Volume: Goal: Ability to maintain a balanced intake and output will improve Outcome: Progressing   

## 2021-05-22 ENCOUNTER — Inpatient Hospital Stay (HOSPITAL_COMMUNITY): Payer: Medicare HMO | Admitting: Certified Registered Nurse Anesthetist

## 2021-05-22 ENCOUNTER — Encounter (HOSPITAL_COMMUNITY)
Admission: EM | Disposition: A | Discharge status: Skilled Nursing Facility | Payer: Self-pay | Source: Home / Self Care | Attending: Internal Medicine

## 2021-05-22 ENCOUNTER — Encounter (HOSPITAL_COMMUNITY): Payer: Self-pay | Admitting: Internal Medicine

## 2021-05-22 DIAGNOSIS — I70262 Atherosclerosis of native arteries of extremities with gangrene, left leg: Secondary | ICD-10-CM

## 2021-05-22 HISTORY — PX: AMPUTATION: SHX166

## 2021-05-22 LAB — CBC
HCT: 35.1 % — ABNORMAL LOW (ref 39.0–52.0)
Hemoglobin: 11.2 g/dL — ABNORMAL LOW (ref 13.0–17.0)
MCH: 28.3 pg (ref 26.0–34.0)
MCHC: 31.9 g/dL (ref 30.0–36.0)
MCV: 88.6 fL (ref 80.0–100.0)
Platelets: 186 10*3/uL (ref 150–400)
RBC: 3.96 MIL/uL — ABNORMAL LOW (ref 4.22–5.81)
RDW: 13.5 % (ref 11.5–15.5)
WBC: 7.6 10*3/uL (ref 4.0–10.5)
nRBC: 0 % (ref 0.0–0.2)

## 2021-05-22 LAB — BASIC METABOLIC PANEL
Anion gap: 6 (ref 5–15)
BUN: 10 mg/dL (ref 8–23)
CO2: 28 mmol/L (ref 22–32)
Calcium: 8.4 mg/dL — ABNORMAL LOW (ref 8.9–10.3)
Chloride: 102 mmol/L (ref 98–111)
Creatinine, Ser: 1.2 mg/dL (ref 0.61–1.24)
GFR, Estimated: >60 mL/min (ref >60)
Glucose, Bld: 167 mg/dL — ABNORMAL HIGH (ref 70–99)
Potassium: 3.3 mmol/L — ABNORMAL LOW (ref 3.5–5.1)
Sodium: 136 mmol/L (ref 135–145)

## 2021-05-22 LAB — HEMOGLOBIN A1C
Hgb A1c MFr Bld: 10.4 % — ABNORMAL HIGH (ref 4.8–5.6)
Mean Plasma Glucose: 251.78 mg/dL

## 2021-05-22 LAB — PROTIME-INR
INR: 1.5 — ABNORMAL HIGH (ref 0.8–1.2)
Prothrombin Time: 18.5 seconds — ABNORMAL HIGH (ref 11.4–15.2)

## 2021-05-22 LAB — GLUCOSE, CAPILLARY
Glucose-Capillary: 165 mg/dL — ABNORMAL HIGH (ref 70–99)
Glucose-Capillary: 180 mg/dL — ABNORMAL HIGH (ref 70–99)
Glucose-Capillary: 167 mg/dL — ABNORMAL HIGH (ref 70–99)
Glucose-Capillary: 150 mg/dL — ABNORMAL HIGH (ref 70–99)
Glucose-Capillary: 285 mg/dL — ABNORMAL HIGH (ref 70–99)

## 2021-05-22 LAB — VITAMIN B12: Vitamin B-12: 307 pg/mL (ref 180–914)

## 2021-05-22 LAB — VITAMIN D 25 HYDROXY (VIT D DEFICIENCY, FRACTURES): Vit D, 25-Hydroxy: 12.3 ng/mL — ABNORMAL LOW (ref 30–100)

## 2021-05-22 LAB — HEPARIN LEVEL (UNFRACTIONATED): Heparin Unfractionated: 0.34 IU/mL (ref 0.30–0.70)

## 2021-05-22 SURGERY — AMPUTATION DIGIT
Anesthesia: Monitor Anesthesia Care | Site: Toe | Laterality: Left

## 2021-05-22 MED ORDER — ONDANSETRON HCL 4 MG/2ML IJ SOLN
INTRAMUSCULAR | Status: DC | PRN
  Administered 2021-05-22: 4 mg via INTRAVENOUS

## 2021-05-22 MED ORDER — AMISULPRIDE (ANTIEMETIC) 5 MG/2ML IV SOLN: 10.0000 mg | Freq: Once | INTRAVENOUS | Status: DC | PRN

## 2021-05-22 MED ORDER — CHLORHEXIDINE GLUCONATE 0.12 % MT SOLN
15.0000 mL | Freq: Once | OROMUCOSAL | Status: AC
  Administered 2021-05-22: 15 mL via OROMUCOSAL

## 2021-05-22 MED ORDER — POTASSIUM CHLORIDE 10 MEQ/100ML IV SOLN
10.0000 meq | INTRAVENOUS | Status: AC
  Filled 2021-05-22: qty 100

## 2021-05-22 MED ORDER — OXYCODONE HCL 5 MG/5ML PO SOLN: 5.0000 mg | Freq: Once | ORAL | Status: DC | PRN

## 2021-05-22 MED ORDER — ACETAMINOPHEN 10 MG/ML IV SOLN: 1000.0000 mg | Freq: Once | INTRAVENOUS | Status: DC | PRN

## 2021-05-22 MED ORDER — FENTANYL CITRATE (PF) 100 MCG/2ML IJ SOLN
25.0000 ug | INTRAMUSCULAR | Status: DC | PRN
  Administered 2021-05-22: 25 ug via INTRAVENOUS

## 2021-05-22 MED ORDER — PROPOFOL 500 MG/50ML IV EMUL
INTRAVENOUS | Status: DC | PRN
  Administered 2021-05-22: 125 ug/kg/min via INTRAVENOUS

## 2021-05-22 MED ORDER — METRONIDAZOLE 500 MG/100ML IV SOLN
500.0000 mg | INTRAVENOUS | Status: AC
  Administered 2021-05-22: 500 mg via INTRAVENOUS
  Filled 2021-05-22: qty 100

## 2021-05-22 MED ORDER — ONDANSETRON HCL 4 MG/2ML IJ SOLN
INTRAMUSCULAR | Status: AC
  Filled 2021-05-22: qty 2

## 2021-05-22 MED ORDER — DEXAMETHASONE SODIUM PHOSPHATE 10 MG/ML IJ SOLN
INTRAMUSCULAR | Status: AC
  Filled 2021-05-22: qty 1

## 2021-05-22 MED ORDER — LACTATED RINGERS IV SOLN: INTRAVENOUS | Status: DC

## 2021-05-22 MED ORDER — LIDOCAINE-EPINEPHRINE 1 %-1:100000 IJ SOLN
INTRAMUSCULAR | Status: DC | PRN
  Administered 2021-05-22: 10 mL via INTRADERMAL

## 2021-05-22 MED ORDER — ORAL CARE MOUTH RINSE: 15.0000 mL | Freq: Once | OROMUCOSAL | Status: AC

## 2021-05-22 MED ORDER — MIDAZOLAM HCL 2 MG/2ML IJ SOLN
INTRAMUSCULAR | Status: DC | PRN
  Administered 2021-05-22 (×2): 1 mg via INTRAVENOUS

## 2021-05-22 MED ORDER — LIDOCAINE-EPINEPHRINE 1 %-1:100000 IJ SOLN
INTRAMUSCULAR | Status: AC
  Filled 2021-05-22: qty 1

## 2021-05-22 MED ORDER — LACTATED RINGERS IV SOLN: INTRAVENOUS | Status: DC | PRN

## 2021-05-22 MED ORDER — ACETAMINOPHEN 160 MG/5ML PO SOLN: 325.0000 mg | ORAL | Status: DC | PRN

## 2021-05-22 MED ORDER — 0.9 % SODIUM CHLORIDE (POUR BTL) OPTIME
TOPICAL | Status: DC | PRN
  Administered 2021-05-22: 1000 mL

## 2021-05-22 MED ORDER — DICLOFENAC SODIUM 1 % EX GEL
2.0000 g | Freq: Four times a day (QID) | CUTANEOUS | Status: DC
  Administered 2021-05-22 – 2021-05-27 (×10): 2 g via TOPICAL
  Filled 2021-05-22: qty 100

## 2021-05-22 MED ORDER — POTASSIUM CHLORIDE 10 MEQ/100ML IV SOLN
10.0000 meq | INTRAVENOUS | Status: AC
  Administered 2021-05-22 (×2): 10 meq via INTRAVENOUS
  Filled 2021-05-22 (×2): qty 100

## 2021-05-22 MED ORDER — MIDAZOLAM HCL 2 MG/2ML IJ SOLN
INTRAMUSCULAR | Status: AC
  Filled 2021-05-22: qty 2

## 2021-05-22 MED ORDER — PHENYLEPHRINE 40 MCG/ML (10ML) SYRINGE FOR IV PUSH (FOR BLOOD PRESSURE SUPPORT)
PREFILLED_SYRINGE | INTRAVENOUS | Status: AC
  Filled 2021-05-22: qty 10

## 2021-05-22 MED ORDER — FENTANYL CITRATE (PF) 250 MCG/5ML IJ SOLN
INTRAMUSCULAR | Status: AC
  Filled 2021-05-22: qty 5

## 2021-05-22 MED ORDER — CHLORHEXIDINE GLUCONATE 0.12 % MT SOLN
OROMUCOSAL | Status: AC
  Filled 2021-05-22: qty 15

## 2021-05-22 MED ORDER — OXYCODONE HCL 5 MG PO TABS: 5.0000 mg | ORAL_TABLET | Freq: Once | ORAL | Status: DC | PRN

## 2021-05-22 MED ORDER — LIDOCAINE HCL 1 % IJ SOLN
INTRAMUSCULAR | Status: AC
  Filled 2021-05-22: qty 20

## 2021-05-22 MED ORDER — ACETAMINOPHEN 325 MG PO TABS: 325.0000 mg | ORAL_TABLET | ORAL | Status: DC | PRN

## 2021-05-22 MED ORDER — HEPARIN (PORCINE) 25000 UT/250ML-% IV SOLN
1700.0000 [IU]/h | INTRAVENOUS | Status: DC
  Administered 2021-05-22: 1700 [IU]/h via INTRAVENOUS
  Filled 2021-05-22: qty 250

## 2021-05-22 MED ORDER — FENTANYL CITRATE (PF) 100 MCG/2ML IJ SOLN
INTRAMUSCULAR | Status: AC
  Filled 2021-05-22: qty 2

## 2021-05-22 MED ORDER — COLCHICINE 0.3 MG HALF TABLET
0.3000 mg | ORAL_TABLET | Freq: Every day | ORAL | Status: DC
  Administered 2021-05-22 – 2021-05-24 (×3): 0.3 mg via ORAL
  Filled 2021-05-22 (×4): qty 1

## 2021-05-22 MED ORDER — VITAMIN D (ERGOCALCIFEROL) 1.25 MG (50000 UNIT) PO CAPS
50000.0000 [IU] | ORAL_CAPSULE | ORAL | Status: DC
  Administered 2021-05-22 – 2021-05-29 (×2): 50000 [IU] via ORAL
  Filled 2021-05-22 (×2): qty 1

## 2021-05-22 MED ORDER — VITAMIN B-12 1000 MCG PO TABS
1000.0000 ug | ORAL_TABLET | Freq: Every day | ORAL | Status: DC
  Administered 2021-05-22 – 2021-05-29 (×8): 1000 ug via ORAL
  Filled 2021-05-22 (×8): qty 1

## 2021-05-22 MED ORDER — PROMETHAZINE HCL 25 MG/ML IJ SOLN: 6.2500 mg | INTRAMUSCULAR | Status: DC | PRN

## 2021-05-22 SURGICAL SUPPLY — 39 items
BAG COUNTER SPONGE SURGICOUNT (BAG) ×2 IMPLANT
BAG SPNG CNTER NS LX DISP (BAG) ×1
BLADE AVERAGE 25X9 (BLADE) IMPLANT
BLADE SURG 21 STRL SS (BLADE) ×2 IMPLANT
BNDG CONFORM 3 STRL LF (GAUZE/BANDAGES/DRESSINGS) IMPLANT
BNDG ELASTIC 4X5.8 VLCR STR LF (GAUZE/BANDAGES/DRESSINGS) ×2 IMPLANT
BNDG GAUZE ELAST 4 BULKY (GAUZE/BANDAGES/DRESSINGS) ×2 IMPLANT
CANISTER SUCT 3000ML PPV (MISCELLANEOUS) ×2 IMPLANT
COVER SURGICAL LIGHT HANDLE (MISCELLANEOUS) ×2 IMPLANT
DRAPE HALF SHEET 40X57 (DRAPES) IMPLANT
DRAPE ORTHO SPLIT 77X108 STRL (DRAPES)
DRAPE SURG ORHT 6 SPLT 77X108 (DRAPES) IMPLANT
DRSG XEROFORM 1X8 (GAUZE/BANDAGES/DRESSINGS) ×2 IMPLANT
ELECT REM PT RETURN 9FT ADLT (ELECTROSURGICAL) ×2
ELECTRODE REM PT RTRN 9FT ADLT (ELECTROSURGICAL) ×1 IMPLANT
GAUZE SPONGE 4X4 12PLY STRL (GAUZE/BANDAGES/DRESSINGS) ×2 IMPLANT
GAUZE SPONGE 4X4 12PLY STRL LF (GAUZE/BANDAGES/DRESSINGS) ×2 IMPLANT
GAUZE XEROFORM 1X8 LF (GAUZE/BANDAGES/DRESSINGS) ×2 IMPLANT
GLOVE SURG POLYISO LF SZ8 (GLOVE) ×2 IMPLANT
GOWN STRL REUS W/ TWL LRG LVL3 (GOWN DISPOSABLE) ×2 IMPLANT
GOWN STRL REUS W/ TWL XL LVL3 (GOWN DISPOSABLE) ×1 IMPLANT
GOWN STRL REUS W/TWL LRG LVL3 (GOWN DISPOSABLE) ×4
GOWN STRL REUS W/TWL XL LVL3 (GOWN DISPOSABLE) ×2
KIT BASIN OR (CUSTOM PROCEDURE TRAY) ×2 IMPLANT
KIT TURNOVER KIT B (KITS) ×2 IMPLANT
NS IRRIG 1000ML POUR BTL (IV SOLUTION) ×2 IMPLANT
PACK GENERAL/GYN (CUSTOM PROCEDURE TRAY) IMPLANT
PACK ORTHO EXTREMITY (CUSTOM PROCEDURE TRAY) ×2 IMPLANT
PAD ARMBOARD 7.5X6 YLW CONV (MISCELLANEOUS) IMPLANT
STAPLER VISISTAT 35W (STAPLE) ×2 IMPLANT
SUT ETHILON 2 0 PSLX (SUTURE) ×2 IMPLANT
SUT ETHILON 3 0 PS 1 (SUTURE) ×2 IMPLANT
SUT SILK 2 0SH CR/8 30 (SUTURE) IMPLANT
SUT VIC AB 2-0 CT1 18 (SUTURE) IMPLANT
SUT VIC AB 3-0 SH 8-18 (SUTURE) IMPLANT
TOWEL GREEN STERILE (TOWEL DISPOSABLE) ×2 IMPLANT
TOWEL GREEN STERILE FF (TOWEL DISPOSABLE) ×2 IMPLANT
UNDERPAD 30X36 HEAVY ABSORB (UNDERPADS AND DIAPERS) ×2 IMPLANT
WATER STERILE IRR 1000ML POUR (IV SOLUTION) IMPLANT

## 2021-05-22 NOTE — Anesthesia Preprocedure Evaluation (Addendum)
Anesthesia Evaluation  Patient identified by MRN, date of birth, ID band Patient awake    Reviewed: Allergy & Precautions, NPO status , Patient's Chart, lab work & pertinent test results  Airway Mallampati: II  TM Distance: >3 FB Neck ROM: Full    Dental  (+) Teeth Intact, Chipped,    Pulmonary sleep apnea , Current Smoker,    Pulmonary exam normal        Cardiovascular hypertension, Pt. on medications and Pt. on home beta blockers +CHF   Rhythm:Regular Rate:Normal  Echo: 1. Left ventricular ejection fraction, by estimation, is 55 to 60%. The  left ventricle has normal function. The left ventricle has no regional  wall motion abnormalities. There is mild left ventricular hypertrophy.  Left ventricular diastolic parameters  are consistent with Grade II diastolic dysfunction (pseudonormalization).  2. Right ventricular systolic function is normal. The right ventricular  size is normal.  3. Left atrial size was mildly dilated.  4. The mitral valve is normal in structure. Trivial mitral valve  regurgitation. No evidence of mitral stenosis.  5. The aortic valve is tricuspid. Aortic valve regurgitation is not  visualized. Mild aortic valve sclerosis is present, with no evidence of  aortic valve stenosis.  6. The inferior vena cava is dilated in size with >50% respiratory  variability, suggesting right atrial pressure of 8 mmHg.    Neuro/Psych PSYCHIATRIC DISORDERS Depression negative neurological ROS     GI/Hepatic negative GI ROS, Neg liver ROS,   Endo/Other  diabetes  Renal/GU Renal InsufficiencyRenal disease     Musculoskeletal negative musculoskeletal ROS (+)   Abdominal Normal abdominal exam  (+)   Peds  Hematology negative hematology ROS (+)   Anesthesia Other Findings   Reproductive/Obstetrics                            Anesthesia Physical Anesthesia Plan  ASA:  3  Anesthesia Plan: MAC   Post-op Pain Management:    Induction: Intravenous  PONV Risk Score and Plan: 1 and Propofol infusion and Ondansetron  Airway Management Planned: Natural Airway and Simple Face Mask  Additional Equipment: None  Intra-op Plan:   Post-operative Plan: Extubation in OR  Informed Consent: I have reviewed the patients History and Physical, chart, labs and discussed the procedure including the risks, benefits and alternatives for the proposed anesthesia with the patient or authorized representative who has indicated his/her understanding and acceptance.     Dental advisory given  Plan Discussed with: CRNA  Anesthesia Plan Comments: (Pt also consented for GA w/ LMA.)        Anesthesia Quick Evaluation

## 2021-05-22 NOTE — Progress Notes (Signed)
[facility] [place]  Subjective  - no complaints.   Objective (!) 158/89 82 98.9 F (37.2 C) 11 97%  Intake/Output Summary (Last 24 hours) at 05/22/2021 1503 Last data filed at 05/22/2021 1408 Gross per 24 hour  Intake --  Output 4875 ml  Net -4875 ml    Left 5th toe gangrene, excellent left peroneal signal at the ankle  Laboratory Lab Results: Recent Labs    05/21/21 0043 05/22/21 0040  WBC 8.3 7.6  HGB 11.2* 11.2*  HCT 35.5* 35.1*  PLT 135* 186   BMET Recent Labs    05/21/21 0043 05/22/21 0040  NA 134* 136  K 3.7 3.3*  CL 105 102  CO2 20* 28  GLUCOSE 253* 167*  BUN 13 10  CREATININE 1.17 1.20  CALCIUM 8.2* 8.4*    COAG Lab Results  Component Value Date   INR 1.5 (H) 05/22/2021   INR 1.7 (H) 05/21/2021   INR 1.9 (H) 05/20/2021   No results found for: PTT  Assessment/Planning:  S/P left leg revascularization with left peroneal angioplasty.  Plan left 5th toe amputation today.  Discussed risk of non-healing even with revascularization.    [name] 05/22/2021 3:03 PM --

## 2021-05-22 NOTE — Transfer of Care (Signed)
Immediate Anesthesia Transfer of Care Note  Patient: [name]  Procedure(s) Performed: LEFT FIFTH TOE AMPUTATION (Left: Toe)  Patient Location: PACU  Anesthesia Type:MAC  Level of Consciousness: drowsy and patient cooperative  Airway & Oxygen Therapy: Patient Spontanous Breathing and Patient connected to face mask oxygen  Post-op Assessment: Report given to RN and Post -op Vital signs reviewed and stable  Post vital signs: Reviewed and stable  Last Vitals:  Vitals Value Taken Time  BP 102/71 05/22/21 1621  Temp    Pulse 80 05/22/21 1622  Resp 9 05/22/21 1622  SpO2 99 % 05/22/21 1622  Vitals shown include unvalidated device data.  Last Pain:  Vitals:   05/22/21 1515  TempSrc:   PainSc: 0-No pain      Patients Stated Pain Goal: 0 (79/39/03 0092)  Complications: No notable events documented.

## 2021-05-22 NOTE — Progress Notes (Addendum)
ANTICOAGULATION CONSULT NOTE - Follow Up Consult  Pharmacy Consult for IV Heparin Indication: atrial fibrillation and Pta LV thrombus  No Known Allergies  Patient Measurements: Height: 5\' 8"  (172.7 cm) Weight: 91 kg (200 lb 9.9 oz) IBW/kg (Calculated) : 68.4 Heparin Dosing Weight: 87 kg  Vital Signs: Temp: 98.1 F (36.7 C) (07/14 1708) BP: 134/89 (07/14 1708) Pulse Rate: 83 (07/14 1708)  Labs: Recent Labs    05/20/21 0331 05/20/21 1141 05/20/21 1932 05/21/21 0043 05/22/21 0040  HGB 11.5*  --   --  11.2* 11.2*  HCT 35.5*  --   --  35.5* 35.1*  PLT 175  --   --  135* 186  LABPROT 22.0*  --   --  20.3* 18.5*  INR 1.9*  --   --  1.7* 1.5*  HEPARINUNFRC 0.16*   < > 0.40 0.36 0.34  CREATININE 1.25*  --   --  1.17 1.20   < > = values in this interval not displayed.    Estimated Creatinine Clearance: 62.7 mL/min (by C-G formula based on SCr of 1.2 mg/dL).   Assessment: 70 yr old man with afib and LV thrombus (pt was on warfarin PTA). Pt was started on heparin on admission in anticipation of surgery (heparin level was within goal range [0.34 units/ml] on heparin infusion rate of 1700 units/hr prior to stopping infusion for surgery today. Pt now S/P amputation of L 5th toe. Per Dr 66, may resume heparin infusion 6 hrs after surgery completed and may resume warfarin tomorrow (05/23/21).  H/H 11.2/35.1, plt 186 (CBC stable); per RN, no bleeding issues post op.  Goal of Therapy:  Heparin level 0.3-0.7 units/ml Monitor platelets by anticoagulation protocol: Yes   Plan:  Resume heparin infusion at 1700 units/hr at midnight tonight Check heparin level 6-7 hrs after resuming heparin infusion Monitor daily heparin level, CBC Monitor for bleeding Will plan to resume warfarin tomorrow, 05/23/21  05/25/21, PharmD, BCPS, [facility] Clinical Pharmacist May 22, 2021

## 2021-05-22 NOTE — Anesthesia Postprocedure Evaluation (Signed)
Anesthesia Post Note  Patient: [name]  Procedure(s) Performed: LEFT FIFTH TOE AMPUTATION (Left: Toe)     Patient location during evaluation: PACU Anesthesia Type: MAC Level of consciousness: awake and alert Pain management: pain level controlled Vital Signs Assessment: post-procedure vital signs reviewed and stable Respiratory status: spontaneous breathing, nonlabored ventilation, respiratory function stable and patient connected to nasal cannula oxygen Cardiovascular status: stable and blood pressure returned to baseline Postop Assessment: no apparent nausea or vomiting Anesthetic complications: no   No notable events documented.  Last Vitals:  Vitals:   05/22/21 1700 05/22/21 1708  BP:  134/89  Pulse: 85 83  Resp: 11 14  Temp:  36.7 C  SpO2: 98% 94%    Last Pain:  Vitals:   05/22/21 1651  TempSrc:   PainSc: 0-No pain                 Effie Berkshire

## 2021-05-22 NOTE — Anesthesia Procedure Notes (Signed)
Procedure Name: MAC Date/Time: 05/22/2021 3:52 PM Performed by: Kathryne Hitch, CRNA Pre-anesthesia Checklist: Patient identified, Emergency Drugs available, Suction available and Patient being monitored Patient Re-evaluated:Patient Re-evaluated prior to induction Oxygen Delivery Method: Simple face mask Preoxygenation: Pre-oxygenation with 100% oxygen Induction Type: IV induction Placement Confirmation: positive ETCO2 Dental Injury: Teeth and Oropharynx as per pre-operative assessment

## 2021-05-22 NOTE — Progress Notes (Signed)
PROGRESS NOTE    [name]  IBB:[id number] DOB: 1951/02/12 DOA: 05/15/2021 PCP: Patient, No Pcp Per (Inactive)   Brief Narrative: 70 year old with past medical history significant for chronic combined CHF, hypertension, PAF on Coumadin, left ventricular thrombus, CKD stage IIIa, diabetes who presents complaining of left pinky toe turning black.  He was found to have necrosis with maggots, was febrile with temperature to 100. 3 and admitted to the hospital.  Orthopedic and vascular surgery consulted.  Patient underwent arteriogram 7/10 with multiple lesions all angioplastied.  Patient awaiting amputation of left big toe.    Assessment & Plan:   Principal Problem:   Toe necrosis (HCC) Active Problems:   HTN (hypertension)   Acute-on-chronic kidney injury (HCC)   CHF (congestive heart failure) (HCC)   Atrial fibrillation (HCC)   1-Necrotic left fifth toe: Orthopedic and vascular surgery consulted. Underwent arteriogram 7/10 status post multiple lesions all angioplastied  with excellent result and no residual stenosis.  Continue with  IV cefepime and Flagyl. Patient was a started on Plavix post procedure For sx today.   2-AKI on CKD stage III A: Baseline creatinine 1.2--1.3. Continue to monitor  3-Chronic Combined Systolic and Diastolic Heart Failure:  Echo ejection fraction 15 to 20%, has recovered in October 2021 to ejection fraction 60% Continue with Lasix  Hypertension: Continue with metoprolol and Lasix  Paroxysmal A. fib: Continue with heparin drip resume Coumadin when okay by vascular  History of left ventricular thrombus: Most recent echo negative for thrombus  Diabetes type 2: Start low-dose Lantus.  Continue with correction insulin HB A1c at 10.  Will likely need insulin at discharge.   Right Knee pain; No acute osseous abnormality.  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 Voltaren gel order. Will  try colchicine for 5 days.   Mild thrombocytopenia. Monitor.  Hypokalemia; replete orally.   Estimated body mass index is 30.5 kg/m as calculated from the following:   Height as of this encounter: 5\' 8"  (1.727 m).   Weight as of this encounter: 91 kg.   DVT prophylaxis: Heparin drip Code Status: Full code Family Communication:  Daughter updated 7/13. Disposition Plan:  Status is: Inpatient  Remains inpatient appropriate because:IV treatments appropriate due to intensity of illness or inability to take PO  Dispo: The patient is from: Home              Anticipated d/c is to: SNF              Patient currently is not medically stable to d/c.   Difficult to place patient No        Consultants:  Ortho Vascular  Procedures:    Antimicrobials:    Subjective: He report knee pain.    Objective: Vitals:   05/21/21 1106 05/21/21 1914 05/21/21 2307 05/22/21 0306  BP: (!) 143/95 (!) 150/89 (!) 152/82 (!) 144/90  Pulse: 88  83 83  Resp: 15  13 18   Temp: 98.8 F (37.1 C) 98.1 F (36.7 C) 98.6 F (37 C) 98.7 F (37.1 C)  TempSrc: Oral Oral Oral Oral  SpO2: 96%  98% 98%  Weight:      Height:        Intake/Output Summary (Last 24 hours) at 05/22/2021 0728 Last data filed at 05/22/2021 0404 Gross per 24 hour  Intake --  Output 3175 ml  Net -3175 ml    Filed Weights   05/16/21 0129  Weight: 91 kg  Examination:  General exam: NAD Respiratory system: CTA Cardiovascular system: S 1, S 2  RRR Gastrointestinal system: BS present, soft, nt Central nervous system:  alert Extremities: left fifth toe necrotic.   Data Reviewed: I have personally reviewed following labs and imaging studies  CBC: Recent Labs  Lab 05/15/21 1853 05/16/21 0310 05/18/21 0406 05/19/21 0850 05/20/21 0331 05/21/21 0043 05/22/21 0040  WBC 10.7*   < > 9.4 9.7 8.6 8.3 7.6  NEUTROABS 7.8*  --   --   --   --   --   --   HGB 13.2   < > 12.1* 12.5* 11.5* 11.2* 11.2*  HCT 40.2   < >  36.3* 37.2* 35.5* 35.5* 35.1*  MCV 89.1   < > 87.7 86.9 89.9 92.9 88.6  PLT 211   < > 148* 174 175 135* 186   < > = values in this interval not displayed.    Basic Metabolic Panel: Recent Labs  Lab 05/16/21 0310 05/17/21 0129 05/18/21 0406 05/19/21 0850 05/20/21 0331 05/21/21 0043 05/22/21 0040  NA 134*  --  134* 136  --  134* 136  K 3.5  --  3.3* 3.6  --  3.7 3.3*  CL 95*  --  101 102  --  105 102  CO2 31  --  22 27  --  20* 28  GLUCOSE 354*  --  212* 199*  --  253* 167*  BUN 21  --  18 15  --  13 10  CREATININE 1.59*   < > 1.26* 1.31* 1.25* 1.17 1.20  CALCIUM 8.9  --  8.7* 8.7*  --  8.2* 8.4*   < > = values in this interval not displayed.    GFR: Estimated Creatinine Clearance: 62.7 mL/min (by C-G formula based on SCr of 1.2 mg/dL). Liver Function Tests: Recent Labs  Lab 05/15/21 1853  AST 16  ALT 23  ALKPHOS 42  BILITOT 1.1  PROT 7.9  ALBUMIN 3.9    No results for input(s): LIPASE, AMYLASE in the last 168 hours. No results for input(s): AMMONIA in the last 168 hours. Coagulation Profile: Recent Labs  Lab 05/18/21 0406 05/19/21 0850 05/20/21 0331 05/21/21 0043 05/22/21 0040  INR 2.6* 1.8* 1.9* 1.7* 1.5*    Cardiac Enzymes: No results for input(s): CKTOTAL, CKMB, CKMBINDEX, TROPONINI in the last 168 hours. BNP (last 3 results) No results for input(s): PROBNP in the last 8760 hours. HbA1C: Recent Labs    05/22/21 0040  HGBA1C 10.4*   CBG: Recent Labs  Lab 05/21/21 0535 05/21/21 1105 05/21/21 1645 05/21/21 2112 05/22/21 0623  GLUCAP 235* 176* 188* 149* 165*    Lipid Profile: No results for input(s): CHOL, HDL, LDLCALC, TRIG, CHOLHDL, LDLDIRECT in the last 72 hours. Thyroid Function Tests: No results for input(s): TSH, T4TOTAL, FREET4, T3FREE, THYROIDAB in the last 72 hours. Anemia Panel: Recent Labs    05/22/21 0040  VITAMINB12 307   Sepsis Labs: Recent Labs  Lab 05/15/21 1853 05/16/21 0310  LATICACIDVEN 2.0* 2.1*     Recent  Results (from the past 240 hour(s))  SARS CORONAVIRUS 2 (TAT 6-24 HRS) Nasopharyngeal Nasopharyngeal Swab     Status: None   Collection Time: 05/15/21 11:23 PM   Specimen: Nasopharyngeal Swab  Result Value Ref Range Status   SARS Coronavirus 2 NEGATIVE NEGATIVE Final    Comment: (NOTE) SARS-CoV-2 target nucleic acids are NOT DETECTED.  The SARS-CoV-2 RNA is generally detectable in upper and lower respiratory specimens during  the acute phase of infection. Negative results do not preclude SARS-CoV-2 infection, do not rule out co-infections with other pathogens, and should not be used as the sole basis for treatment or other patient management decisions. Negative results must be combined with clinical observations, patient history, and epidemiological information. The expected result is Negative.  Fact Sheet for Patients: HairSlick.no  Fact Sheet for Healthcare Providers: quierodirigir.com  This test is not yet approved or cleared by the [place] FDA and  has been authorized for detection and/or diagnosis of SARS-CoV-2 by FDA under an Emergency Use Authorization (EUA). This EUA will remain  in effect (meaning this test can be used) for the duration of the COVID-19 declaration under Se ction 564(b)(1) of the Act, 21 U.S.C. section 360bbb-3(b)(1), unless the authorization is terminated or revoked sooner.  Performed at [facility], [address], [place], [place] [address]   Culture, blood (routine x 2)     Status: None   Collection Time: 05/16/21  3:10 AM   Specimen: BLOOD  Result Value Ref Range Status   Specimen Description BLOOD SITE NOT SPECIFIED  Final   Special Requests   Final    BOTTLES DRAWN AEROBIC AND ANAEROBIC Blood Culture adequate volume   Culture   Final    NO GROWTH 5 DAYS Performed at [facility], [address] [place], [place] [address]    Report Status 05/21/2021 FINAL  Final   Culture, blood (routine x 2)     Status: None   Collection Time: 05/16/21  3:10 AM   Specimen: BLOOD  Result Value Ref Range Status   Specimen Description BLOOD SITE NOT SPECIFIED  Final   Special Requests   Final    BOTTLES DRAWN AEROBIC AND ANAEROBIC Blood Culture adequate volume   Culture   Final    NO GROWTH 5 DAYS Performed at [facility], [address] [place], [place] [address]    Report Status 05/21/2021 FINAL  Final  MRSA Next Gen by PCR, Nasal     Status: None   Collection Time: 05/18/21 11:15 AM   Specimen: Nasal Mucosa; Nasal Swab  Result Value Ref Range Status   MRSA by PCR Next Gen NOT DETECTED NOT DETECTED Final    Comment: (NOTE) The GeneXpert MRSA Assay (FDA approved for NASAL specimens only), is one component of a comprehensive MRSA colonization surveillance program. It is not intended to diagnose MRSA infection nor to guide or monitor treatment for MRSA infections. Test performance is not FDA approved in patients less than 24 years old. Performed at [facility], [address], [place], [place] [address]   Surgical pcr screen     Status: None   Collection Time: 05/19/21  8:42 AM   Specimen: Nasal Mucosa; Nasal Swab  Result Value Ref Range Status   MRSA, PCR NEGATIVE NEGATIVE Final   Staphylococcus aureus NEGATIVE NEGATIVE Final    Comment: (NOTE) The Xpert SA Assay (FDA approved for NASAL specimens in patients 62 years of age and older), is one component of a comprehensive surveillance program. It is not intended to diagnose infection nor to guide or monitor treatment. Performed at [facility], [address], [place], [place] [address]           Radiology Studies: DG Knee 1-2 Views Right  Result Date: 05/21/2021 CLINICAL DATA:  Persistent right knee pain times multiple days EXAM: RIGHT KNEE - 1-2 VIEW COMPARISON:  None. FINDINGS: No  evidence of fracture, dislocation, or joint effusion. Chondrocalcinosis. Tricompartment  degenerative change worse in the medial tibiofemoral compartment where it is moderate. Vascular calcifications. IMPRESSION: 1. No acute osseous abnormality. 2. 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 Electronically Signed   By: Maudry Mayhew MD   On: 05/21/2021 17:20        Scheduled Meds:  atorvastatin  10 mg Oral Daily   clopidogrel  75 mg Oral Q breakfast   colchicine  0.3 mg Oral Daily   diclofenac Sodium  2 g Topical QID   furosemide  40 mg Oral BID   insulin aspart  0-15 Units Subcutaneous TID WC   insulin glargine  8 Units Subcutaneous Daily   metoprolol succinate  100 mg Oral BID   sodium chloride flush  3 mL Intravenous Q12H   vitamin B-12  1,000 mcg Oral Daily   Vitamin D (Ergocalciferol)  50,000 Units Oral Q7 days   Continuous Infusions:  sodium chloride     ceFEPime (MAXIPIME) IV 2 g (05/22/21 0108)   heparin 1,700 Units/hr (05/21/21 1913)   metronidazole 500 mg (05/22/21 0603)   potassium chloride       LOS: 7 days    Time spent: 35 minutes.     Alba Cory, MD [facility]   If 7PM-7AM, please contact night-coverage [email or web address]  05/22/2021, 7:28 AM

## 2021-05-22 NOTE — Progress Notes (Signed)
Heparin drip stopped per Dr. Chestine Spore.

## 2021-05-22 NOTE — Progress Notes (Signed)
PT Cancellation Note  Patient Details Name: ARLES RUMBOLD MRN: [id number] DOB: 1951-05-16   Cancelled Treatment:    Reason Eval/Treat Not Completed: Patient at procedure or test/unavailable. Pt in surgery.    Angelina Ok [facility] 05/22/2021, 3:08 PM Skip Mayer PT Acute Rehabilitation Services Pager [phone] Office [phone]

## 2021-05-22 NOTE — Plan of Care (Signed)
  Problem: Education: Goal: Ability to describe self-care measures that may prevent or decrease complications (Diabetes Survival Skills Education) will improve Outcome: Progressing Goal: Individualized Educational Video(s) Outcome: Progressing   

## 2021-05-22 NOTE — Progress Notes (Signed)
OT Cancellation Note  Patient Details Name: [name] MRN: [id number] DOB: [birth year]   Cancelled Treatment:    Reason Eval/Treat Not Completed: Other (comment)- pt declined OT at this time due to surgery today, will follow and see post op.   Barry Brunner, OT Acute Rehabilitation Services Pager [phone] Office [phone]   [name] 05/22/2021, 1:00 PM

## 2021-05-22 NOTE — Progress Notes (Signed)
ANTICOAGULATION CONSULT NOTE - Follow Up Consult  Pharmacy Consult for IV Heparin Indication: atrial fibrillation and Pta LV thrombus  No Known Allergies  Patient Measurements: Height: 5\' 8"  (172.7 cm) Weight: 91 kg (200 lb 9.9 oz) IBW/kg (Calculated) : 68.4 Heparin Dosing Weight: 87 kg  Vital Signs: Temp: 98.9 F (37.2 C) (07/14 0742) Temp Source: Oral (07/14 0306) BP: 156/97 (07/14 0742) Pulse Rate: 85 (07/14 0742)  Labs: Recent Labs    05/20/21 0331 05/20/21 1141 05/20/21 1932 05/21/21 0043 05/22/21 0040  HGB 11.5*  --   --  11.2* 11.2*  HCT 35.5*  --   --  35.5* 35.1*  PLT 175  --   --  135* 186  LABPROT 22.0*  --   --  20.3* 18.5*  INR 1.9*  --   --  1.7* 1.5*  HEPARINUNFRC 0.16*   < > 0.40 0.36 0.34  CREATININE 1.25*  --   --  1.17 1.20   < > = values in this interval not displayed.     Estimated Creatinine Clearance: 62.7 mL/min (by C-G formula based on SCr of 1.2 mg/dL).   Medications:  Infusions:   sodium chloride     ceFEPime (MAXIPIME) IV 2 g (05/22/21 0108)   heparin 1,700 Units/hr (05/21/21 1913)   metronidazole 500 mg (05/22/21 0603)   potassium chloride      Assessment: 70 yo m with afib and LV thrombus (PTA coumadin). Plans for surgery for amputation of necrotic left toe. Plt improved, h/h stable.  Heparin 1700 units/hr Heparin level 0.34, therapeutic.   Goal of Therapy:  Heparin level 0.3-0.7 units/ml Monitor platelets by anticoagulation protocol: Yes   Plan:  Continue heparin 1700 units/hr Daily HL and CBC Surgery schedule this afternoon. Will f/u to resume anticoagulation post op.  66, PharmD PGY1 Acute Care Resident  May 22, 2021

## 2021-05-22 NOTE — Op Note (Signed)
Date: May 22, 2021  Preoperative diagnosis: Critical limb ischemia of the left lower extremity with necrotic left fifth toe  Postoperative diagnosis: same  Procedure: Left fifth toe amputation  Surgeon: Dr. Marty Heck, MD  Assistant: OR staff  Indications: Patient is a 70 year old male seen by Dr. Stanford Breed earlier this week with critical limb ischemia of the left lower extremity with left fifth toe gangrene.  He was found to have single-vessel peroneal runoff and underwent intervention earlier this week with left peroneal angioplasty.  He presents today for left fifth toe amputation after risk benefits discussed.  Findings: Left fifth toe amputation with fishmouth incision at base of toe where tissue was viable.  Marginal bleeding in the tissue.  Anesthesia: MAC  Details: Patient was taken to the operating room after informed consent was obtained.  Ultimately after anesthesia was induced the left foot was prepped and draped in the usual sterile fashion.  Timeout was performed antibiotics were given.  I performed digital block of the left fifth toe with 1% lidocaine with epinephrine.  Initially made a fishmouth incision at the base of the left fifth toe where the tissue appeared healthy with a 21 blade scalpel.  Ultimately I then used Metzenbaum scissors to transect the phalanges joint where the toe was frankly necrotic.  I then took rongeur and ultimately debrided back more bone and soft tissue to near the metatarsal head.  There was no purulent drainage.  There was marginal bleeding in the tissue.  The wound was irrigated out.  The skin was then reapproximated with 2-0 nylons in interrupted fashion.  Dry sterile dressings were applied.  Complication: None  Condition: Stable  Marty Heck, MD [facility] Office: [place]

## 2021-05-23 ENCOUNTER — Inpatient Hospital Stay (HOSPITAL_COMMUNITY): Payer: Medicare HMO

## 2021-05-23 ENCOUNTER — Encounter (HOSPITAL_COMMUNITY): Payer: Self-pay | Admitting: Vascular Surgery

## 2021-05-23 DIAGNOSIS — I96 Gangrene, not elsewhere classified: Secondary | ICD-10-CM | POA: Diagnosis not present

## 2021-05-23 LAB — BASIC METABOLIC PANEL
Anion gap: 10 (ref 5–15)
BUN: 12 mg/dL (ref 8–23)
CO2: 25 mmol/L (ref 22–32)
Calcium: 8.5 mg/dL — ABNORMAL LOW (ref 8.9–10.3)
Chloride: 100 mmol/L (ref 98–111)
Creatinine, Ser: 1.19 mg/dL (ref 0.61–1.24)
GFR, Estimated: >60 mL/min (ref >60)
Glucose, Bld: 185 mg/dL — ABNORMAL HIGH (ref 70–99)
Potassium: 3.4 mmol/L — ABNORMAL LOW (ref 3.5–5.1)
Sodium: 135 mmol/L (ref 135–145)

## 2021-05-23 LAB — GLUCOSE, CAPILLARY
Glucose-Capillary: 176 mg/dL — ABNORMAL HIGH (ref 70–99)
Glucose-Capillary: 233 mg/dL — ABNORMAL HIGH (ref 70–99)
Glucose-Capillary: 233 mg/dL — ABNORMAL HIGH (ref 70–99)
Glucose-Capillary: 220 mg/dL — ABNORMAL HIGH (ref 70–99)

## 2021-05-23 LAB — CREATININE, SERUM
Creatinine, Ser: 1.25 mg/dL — ABNORMAL HIGH (ref 0.61–1.24)
GFR, Estimated: >60 mL/min (ref >60)

## 2021-05-23 LAB — HEPARIN LEVEL (UNFRACTIONATED): Heparin Unfractionated: 0.27 IU/mL — ABNORMAL LOW (ref 0.30–0.70)

## 2021-05-23 LAB — PROTIME-INR
INR: 1.6 — ABNORMAL HIGH (ref 0.8–1.2)
Prothrombin Time: 18.7 seconds — ABNORMAL HIGH (ref 11.4–15.2)

## 2021-05-23 MED ORDER — WARFARIN SODIUM 3 MG PO TABS
6.0000 mg | ORAL_TABLET | Freq: Once | ORAL | Status: AC
  Administered 2021-05-23: 6 mg via ORAL
  Filled 2021-05-23: qty 2

## 2021-05-23 MED ORDER — LIVING WELL WITH DIABETES BOOK
Freq: Once | Status: DC
  Filled 2021-05-23: qty 1

## 2021-05-23 MED ORDER — INSULIN STARTER KIT- SYRINGES (ENGLISH)
1.0000 | Freq: Once | Status: DC
  Filled 2021-05-23: qty 1

## 2021-05-23 MED ORDER — SACCHAROMYCES BOULARDII 250 MG PO CAPS: 250.0000 mg | ORAL_CAPSULE | Freq: Two times a day (BID) | ORAL | Status: DC

## 2021-05-23 MED ORDER — ENOXAPARIN SODIUM 100 MG/ML IJ SOSY
1.0000 mg/kg | PREFILLED_SYRINGE | Freq: Two times a day (BID) | INTRAMUSCULAR | Status: DC
  Administered 2021-05-23 – 2021-05-26 (×8): 90 mg via SUBCUTANEOUS
  Filled 2021-05-23 (×9): qty 0.9

## 2021-05-23 MED ORDER — OXYCODONE-ACETAMINOPHEN 5-325 MG PO TABS
1.0000 | ORAL_TABLET | Freq: Four times a day (QID) | ORAL | Status: DC | PRN
  Administered 2021-05-23 (×2): 1 via ORAL
  Filled 2021-05-23 (×2): qty 1

## 2021-05-23 MED ORDER — WARFARIN - PHARMACIST DOSING INPATIENT: Freq: Every day | Status: DC

## 2021-05-23 MED ORDER — POTASSIUM CHLORIDE CRYS ER 20 MEQ PO TBCR
40.0000 meq | EXTENDED_RELEASE_TABLET | Freq: Once | ORAL | Status: AC
  Administered 2021-05-23: 40 meq via ORAL
  Filled 2021-05-23: qty 2

## 2021-05-23 NOTE — Progress Notes (Signed)
[facility]  Subjective  -no complaints   Objective 140/90 89 97.9 F (36.6 C) (Oral) 11 (!) 86%  Intake/Output Summary (Last 24 hours) at 05/23/2021 1231 Last data filed at 05/23/2021 0600 Gross per 24 hour  Intake 3320.53 ml  Output 3115 ml  Net 205.53 ml    Left peroneal signal triphasic at the ankle Left fifth toe amputation clean dry and intact after dressing removed  Laboratory Lab Results: Recent Labs    05/21/21 0043 05/22/21 0040  WBC 8.3 7.6  HGB 11.2* 11.2*  HCT 35.5* 35.1*  PLT 135* 186   BMET Recent Labs    05/22/21 0040 05/23/21 0103 05/23/21 0730  NA 136  --  135  K 3.3*  --  3.4*  CL 102  --  100  CO2 28  --  25  GLUCOSE 167*  --  185*  BUN 10  --  12  CREATININE 1.20 1.25* 1.19  CALCIUM 8.4*  --  8.5*    COAG Lab Results  Component Value Date   INR 1.6 (H) 05/23/2021   INR 1.5 (H) 05/22/2021   INR 1.7 (H) 05/21/2021   No results found for: PTT  Assessment/Planning:  70 year old male status post left lower extremity angiogram with left peroneal angioplasty single-vessel tibial runoff.  He has a triphasic peroneal signal at the ankle which is excellent.  He underwent left fifth toe amputation yesterday and I removed the dressing today.  Would recommend heel weightbearing with Darco shoe which we will order.  He is on Coumadin for his A. fib and then needs Plavix at discharge.  Discussed sutures will stay for 3 weeks.  I will arrange follow-up in our office.  Patient states he is anticipating discharge either rehab or SNF.  Cephus Shelling 05/23/2021 12:31 PM --

## 2021-05-23 NOTE — Progress Notes (Signed)
Occupational Therapy Treatment Patient Details Name: [name] MRN: [id number] DOB: 1951/08/25 Today's Date: 05/23/2021    History of present illness Pt adm 7/7 with necrotic lt fifth toe with maggots. Underwent arteriogram 7/10 s/p multiple lesions angioplastied. 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 PMH - chf, htn, ckd,dm   OT comments  Pt progressing towards acute OT goals. Able to complete bed mobility, sit EOB several minutes then stand and sidestep along EOB with up to mod assist. Needs L Darco shoe on during LLE WB activity (ie standing, walking). Pt c/o dizziness/lightheadedness after coming from supine>EOB, reports this happens PTA as well. BP assessed towards end of session: 144/93. D/c plan remains appropriate.    Follow Up Recommendations  SNF    Equipment Recommendations  Other (comment) (defer to next venue)    Recommendations for Other Services      Precautions / Restrictions Precautions Precautions: Fall;Other (comment) Precaution Comments: Very poor vision. able to distinguish light/dark. Restrictions Other Position/Activity Restrictions: L Darco shoe       Mobility Bed Mobility Overal bed mobility: Needs Assistance Bed Mobility: Supine to Sit;Sit to Supine     Supine to sit: Mod assist Sit to supine: Min assist   General bed mobility comments: assist to fully powerup trunk and pivot hip to EOB position. Min A to control descent back into supine.    Transfers Overall transfer level: Needs assistance Equipment used: Rolling walker (2 wheeled) Transfers: Sit to/from Stand Sit to Stand: Mod assist;From elevated surface         General transfer comment: cues for sequencing, assist to powerup and steady. seat height elevated.    Balance Overall balance assessment: Needs assistance Sitting-balance support: Bilateral upper extremity supported;Feet supported Sitting balance-Leahy Scale: Poor Sitting balance - Comments: UE support for  static sitting   Standing balance support: Bilateral upper extremity supported Standing balance-Leahy Scale: Poor Standing balance comment: walker and min assist for static standing                           ADL either performed or assessed with clinical judgement   ADL Overall ADL's : Needs assistance/impaired                                     Functional mobility during ADLs: Minimal assistance;Rolling walker;Cueing for safety;Cueing for sequencing General ADL Comments: Pt completed bed mobility, sat EOB a few minutes, then stood and sidestepped to his left. 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 Able to follow one-step commands. Some difficulty sequencing.        Exercises     Shoulder Instructions       General Comments      Pertinent Vitals/ Pain       Pain Assessment: Faces Faces Pain Scale: Hurts even more Pain Location: L hip, heel, and foot Pain Descriptors / Indicators:  Guarding;Grimacing Pain Intervention(s): Monitored during session;Repositioned;Limited activity within patient's tolerance  Home Living                                          Prior Functioning/Environment              Frequency  Min 2X/week        Progress Toward Goals  OT Goals(current goals can now be found in the care plan section)  Progress towards OT goals: Progressing toward goals  Acute Rehab OT Goals Patient Stated Goal: Did not state OT Goal Formulation: With patient Time For Goal Achievement: 06/03/21 Potential to Achieve Goals: Fair ADL  Goals Pt Will Perform Grooming: with min guard assist;standing Pt Will Perform Lower Body Dressing: with min assist;sitting/lateral leans;sit to/from stand Pt Will Transfer to Toilet: with min assist;ambulating;bedside commode Pt/caregiver will Perform Home Exercise Program: Both right and left upper extremity;With Supervision Additional ADL Goal #1: Pt will require <3 cues for redirection of tangential topics in order to attend to functional tasks throughout session. Additional ADL Goal #2: Pt will increase to x3 mins of standing tolerance with minguardA overall for stability with least restrictive assistive device.  Plan Discharge plan remains appropriate    Co-evaluation                 AM-PAC OT "6 Clicks" Daily Activity     Outcome Measure   Help from another person eating meals?: None Help from another person taking care of personal grooming?: A Little Help from another person toileting, which includes using toliet, bedpan, or urinal?: A Lot Help from another person bathing (including washing, rinsing, drying)?: A Lot Help from another person to put on and taking off regular upper body clothing?: A Little Help from another person to put on and taking off regular lower body clothing?: A Lot 6 Click Score: 16    End of Session Equipment Utilized During Treatment: Rolling walker;Other (comment) (L Darco shoe)  OT Visit Diagnosis: Unsteadiness on feet (R26.81);Muscle weakness (generalized) (M62.81)   Activity Tolerance Patient limited by pain;Other (comment) (dizzy/lightheaded after positional change)   Patient Left in bed;with call bell/phone within reach;with bed alarm set   Nurse Communication Mobility status        Time: 8003-4917 OT Time Calculation (min): 36 min  Charges: OT General Charges $OT Visit: 1 Visit OT Treatments $Self Care/Home Management : 23-37 mins  Raynald Kemp, OT Acute Rehabilitation Services Pager: [phone] Office:  [phone]    Pilar Grammes 05/23/2021, 2:02 PM

## 2021-05-23 NOTE — Plan of Care (Signed)
  Problem: Education: Goal: Ability to describe self-care measures that may prevent or decrease complications (Diabetes Survival Skills Education) will improve Outcome: Progressing   Problem: Coping: Goal: Ability to adjust to condition or change in health will improve Outcome: Progressing   

## 2021-05-23 NOTE — Discharge Instructions (Signed)

## 2021-05-23 NOTE — Progress Notes (Signed)
Inpatient Diabetes Program Recommendations  AACE/ADA: New Consensus Statement on Inpatient Glycemic Control   Target Ranges:  Prepandial:   less than 140 mg/dL      Peak postprandial:   less than 180 mg/dL (1-2 hours)      Critically ill patients:  140 - 180 mg/dL   Results for [name], [name] (MRN [id number]) as of 05/23/2021 14:28  Ref. Range 05/22/2021 06:23 05/22/2021 10:57 05/22/2021 15:10 05/22/2021 16:37 05/22/2021 21:01 05/23/2021 06:24 05/23/2021 11:33  Glucose-Capillary Latest Ref Range: 70 - 99 mg/dL 165 (H) 180 (H) 167 (H) 150 (H) 285 (H) 176 (H) 233 (H)   Results for [name], [name] (MRN [id number]) as of 05/23/2021 14:28  Ref. Range 05/22/2021 00:40  Hemoglobin A1C Latest Ref Range: 4.8 - 5.6 % 10.4 (H)   Review of Glycemic Control  Diabetes history: DM2 Outpatient Diabetes medications: None Current orders for Inpatient glycemic control: Lantus 8 units daily, Novolog 0-15 units TID with meals  Inpatient Diabetes Program Recommendations:    Diet: Please discontinue Regular diet and order Carb Modified diet.   HbgA1C: Current A1C 10.4% on 05/22/21 indicating an average glucose of 252 mg/dl. Will need to be prescribed DM medications at discharge. Also, please provide Rx for glucose mon  NOTE: Spoke with patient about diabetes and home regimen for diabetes control. Patient lying in bed and kept eyes closed during most of conversation but he was engaged. Patient states he can not see very well and his daughter ([name]) lives with him. Patient reports that he does not take any medications for DM and never has. Patient states that he does not check his glucose at home. Patient reports that he does not have [name] glucometer at home. Discussed A1C results (10.4% on 05/22/21) and explained that current A1C indicates an average glucose of 252 mg/dl over the past 2-3 months. Discussed glucose and A1C goals. Discussed importance of checking CBGs and maintaining good CBG control to prevent long-term  and short-term complications.  Stressed to the patient the importance of improving glycemic control to prevent further complications from uncontrolled diabetes especially following surgery. Asked patient if he would be willing to check glucose and take DM medications if prescribed and patient states he would be willing to do so if his daughter can help him with it as he can not see well at all. Patient asked that I call his daughter [name] about checking his glucose and DM medications. Patient asked what DM medications he would be prescribed. Explained that provider has not noted exactly what DM medications would be prescribed but it was noted that he may need insulin outpatient. Patient states he would need affordable insulin if he was prescribed insulin. Discussed Reli-On insulins (NPH, Regular, and 70/30) which are $25 per vial or $43 per box of 5 insulin pens. Asked patient if I could show him how to use vial/syringe or insulin pens and he again stated that I could talk with his daughter about it because he can not see well enough to see either one. Encouraged patient to allow his daughter to check glucose and give DM medications as prescribed and asked that he follow up with PCP regarding DM management.  Patient verbalized understanding of information discussed and reports no further questions at this time related to diabetes. Called patient's daughter [name] over the phone. She confirms that her father does not check glucose or take DM medications. She states that her mother has DM. She states that she could check his glucose and   administer insulin if needed. Discussed current glucose trends and A1C of 10.4%.  Discussed glucose and A1C goals. Stressed that DM control needs to be improved to decrease risk of further complications from uncontrolled DM especially following surgery. [name] states that whatever patient is prescribed would need to be sent to [facility] so it would be free or just [name] couple of dollars. If  sent to [facility], she states it will take about 5-7 days before he gets the medication. Explained that he will need to start discharge medications at time of discharge. She states that her father use to get meds from [facility] and she could call them and see what cost would be if she knew what he was going to be prescribed. She also notes that patient has athlete's foot and she wanted to see if attending provider would prescribe him something for it. Informed her I would let attending provider know about the request. Discussed carb modified diet and informed her that [name] living well with DM book would be ordered for her to learn more about DM management. She asked to talk with RD about Carb modified diet and asked for handouts. Will place consult for RD to talk with her. [name] notes she will not be able to come to the hospital until after 5:15 or so. Informed her that I would ask RN to review insulin administration with her when she arrives this evening. Will also order insulin starter kit. [name] verbalized understanding of information discussed and she states that she has no questions at this time related to DM or insulin. At time of discharge, please provide Rx for glucose monitoring kit (#[id number]) and DM medications. If prescribed DM medications patient has requested that they be affordable medications.  Thanks, Barnie Alderman, RN, MSN, CDE Diabetes Coordinator Inpatient Diabetes Program [phone] (Team Pager)

## 2021-05-23 NOTE — Progress Notes (Addendum)
PROGRESS NOTE    [name]  EHM:[id number] DOB: [birth year] DOA: 05/15/2021 PCP: Patient, No Pcp Per (Inactive)   Brief Narrative: 70 year old with past medical history significant for chronic combined CHF, hypertension, PAF on Coumadin, left ventricular thrombus, CKD stage IIIa, diabetes who presents complaining of left pinky toe turning black.  He was found to have necrosis with maggots, was febrile with temperature to 100. 3 and admitted to the hospital.  Orthopedic and vascular surgery consulted.  Patient underwent arteriogram 7/10 with multiple lesions all angioplastied.  Patient awaiting amputation of left big toe.    Assessment & Plan:   Principal Problem:   Toe necrosis (HCC) Active Problems:   HTN (hypertension)   Acute-on-chronic kidney injury (HCC)   CHF (congestive heart failure) (HCC)   Atrial fibrillation (HCC)   1-Necrotic left fifth toe/ Critical Limb ischemia;  Orthopedic and vascular surgery consulted. Underwent arteriogram 7/10 status post multiple lesions all angioplastied  with excellent result and no residual stenosis.  Continue with  IV cefepime and Flagyl. 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 Sutures will stay for 3 weeks, needs plavix at discharge. Needs heel weightbearing with Darco.    2-AKI on CKD stage III A: Baseline creatinine 1.2--1.3. Continue to monitor  3-Chronic Combined Systolic and Diastolic Heart Failure:  Echo ejection fraction 15 to 20%, has recovered in October 2021 to ejection fraction 60% Continue with Lasix  Hypertension: Continue with metoprolol and Lasix  Paroxysmal A. fib: Continue with heparin drip resume Coumadin when okay by vascular  History of left ventricular thrombus: Most recent echo negative for thrombus  Diabetes type 2: Start low-dose Lantus.  Continue with correction insulin HB A1c at 10.  Will likely need insulin at discharge.   Right Knee pain; No  acute osseous abnormality.  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 Voltaren gel order. colchicine for 5 days.   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 He will need to follow up with ortho.    Mild thrombocytopenia. Monitor.  Hypokalemia; replete orally.   Estimated body mass index is 30.5 kg/m as calculated from the following:   Height as of this encounter: 5\' 8"  (1.727 m).   Weight as of this encounter: 91 kg.   DVT prophylaxis: Heparin drip Code Status: Full code Family Communication:  Daughter updated 7/13. Disposition Plan:  Status is: Inpatient  Remains inpatient appropriate because:IV treatments appropriate due to intensity of illness or inability to take PO  Dispo: The patient is from: Home              Anticipated d/c is to: SNF              Patient currently is medically stable to d/c. Awaiting SNF   Difficult to place patient No        Consultants:  Ortho Vascular  Procedures:    Antimicrobials:    Subjective: Knee pain improved. Report chronic hip pain.    Objective: Vitals:   05/22/21 2001 05/22/21 2317 05/23/21 0311 05/23/21 0609  BP: 140/75 131/72 135/80   Pulse: 88 77 84 79  Resp: 18 15 15 10   Temp: 98.2 F (36.8 C) 98 F (36.7 C) 98.5 F (36.9 C)   TempSrc: Oral Oral Oral   SpO2: 95% 94% 95% (!) 86%  Weight:      Height:  Intake/Output Summary (Last 24 hours) at 05/23/2021 0716 Last data filed at 05/23/2021 0600 Gross per 24 hour  Intake 3320.53 ml  Output 3115 ml  Net 205.53 ml    Filed Weights   05/16/21 0129  Weight: 91 kg    Examination:  General exam: NAD Respiratory system: CTA Cardiovascular system:  S 1, S 2 RRR Gastrointestinal system: BS present, soft, nt Central nervous system: Alert Extremities: lSP left fifth toe amputation.   Data Reviewed: I have personally reviewed  following labs and imaging studies  CBC: Recent Labs  Lab 05/18/21 0406 05/19/21 0850 05/20/21 0331 05/21/21 0043 05/22/21 0040  WBC 9.4 9.7 8.6 8.3 7.6  HGB 12.1* 12.5* 11.5* 11.2* 11.2*  HCT 36.3* 37.2* 35.5* 35.5* 35.1*  MCV 87.7 86.9 89.9 92.9 88.6  PLT 148* 174 175 135* 186    Basic Metabolic Panel: Recent Labs  Lab 05/18/21 0406 05/19/21 0850 05/20/21 0331 05/21/21 0043 05/22/21 0040 05/23/21 0103  NA 134* 136  --  134* 136  --   K 3.3* 3.6  --  3.7 3.3*  --   CL 101 102  --  105 102  --   CO2 22 27  --  20* 28  --   GLUCOSE 212* 199*  --  253* 167*  --   BUN 18 15  --  13 10  --   CREATININE 1.26* 1.31* 1.25* 1.17 1.20 1.25*  CALCIUM 8.7* 8.7*  --  8.2* 8.4*  --     GFR: Estimated Creatinine Clearance: 60.2 mL/min (A) (by C-G formula based on SCr of 1.25 mg/dL (H)). Liver Function Tests: No results for input(s): AST, ALT, ALKPHOS, BILITOT, PROT, ALBUMIN in the last 168 hours.  No results for input(s): LIPASE, AMYLASE in the last 168 hours. No results for input(s): AMMONIA in the last 168 hours. Coagulation Profile: Recent Labs  Lab 05/19/21 0850 05/20/21 0331 05/21/21 0043 05/22/21 0040 05/23/21 0103  INR 1.8* 1.9* 1.7* 1.5* 1.6*    Cardiac Enzymes: No results for input(s): CKTOTAL, CKMB, CKMBINDEX, TROPONINI in the last 168 hours. BNP (last 3 results) No results for input(s): PROBNP in the last 8760 hours. HbA1C: Recent Labs    05/22/21 0040  HGBA1C 10.4*    CBG: Recent Labs  Lab 05/22/21 1057 05/22/21 1510 05/22/21 1637 05/22/21 2101 05/23/21 0624  GLUCAP 180* 167* 150* 285* 176*    Lipid Profile: No results for input(s): CHOL, HDL, LDLCALC, TRIG, CHOLHDL, LDLDIRECT in the last 72 hours. Thyroid Function Tests: No results for input(s): TSH, T4TOTAL, FREET4, T3FREE, THYROIDAB in the last 72 hours. Anemia Panel: Recent Labs    05/22/21 0040  VITAMINB12 307    Sepsis Labs: No results for input(s): PROCALCITON, LATICACIDVEN  in the last 168 hours.   Recent Results (from the past 240 hour(s))  SARS CORONAVIRUS 2 (TAT 6-24 HRS) Nasopharyngeal Nasopharyngeal Swab     Status: None   Collection Time: 05/15/21 11:23 PM   Specimen: Nasopharyngeal Swab  Result Value Ref Range Status   SARS Coronavirus 2 NEGATIVE NEGATIVE Final    Comment: (NOTE) SARS-CoV-2 target nucleic acids are NOT DETECTED.  The SARS-CoV-2 RNA is generally detectable in upper and lower respiratory specimens during the acute phase of infection. Negative results do not preclude SARS-CoV-2 infection, do not rule out co-infections with other pathogens, and should not be used as the sole basis for treatment or other patient management decisions. Negative results must be combined with clinical observations, patient history, and epidemiological information.  The expected result is Negative.  Fact Sheet for Patients: HairSlick.no  Fact Sheet for Healthcare Providers: quierodirigir.com  This test is not yet approved or cleared by the [place] FDA and  has been authorized for detection and/or diagnosis of SARS-CoV-2 by FDA under an Emergency Use Authorization (EUA). This EUA will remain  in effect (meaning this test can be used) for the duration of the COVID-19 declaration under Se ction 564(b)(1) of the Act, 21 U.S.C. section 360bbb-3(b)(1), unless the authorization is terminated or revoked sooner.  Performed at [facility], [address] [place], [place] [address]   Culture, blood (routine x 2)     Status: None   Collection Time: 05/16/21  3:10 AM   Specimen: BLOOD  Result Value Ref Range Status   Specimen Description BLOOD SITE NOT SPECIFIED  Final   Special Requests   Final    BOTTLES DRAWN AEROBIC AND ANAEROBIC Blood Culture adequate volume   Culture   Final    NO GROWTH 5 DAYS Performed at [facility], [address], [place], [place] [address]    Report  Status 05/21/2021 FINAL  Final  Culture, blood (routine x 2)     Status: None   Collection Time: 05/16/21  3:10 AM   Specimen: BLOOD  Result Value Ref Range Status   Specimen Description BLOOD SITE NOT SPECIFIED  Final   Special Requests   Final    BOTTLES DRAWN AEROBIC AND ANAEROBIC Blood Culture adequate volume   Culture   Final    NO GROWTH 5 DAYS Performed at [facility], [address], [place], [place] [address]    Report Status 05/21/2021 FINAL  Final  MRSA Next Gen by PCR, Nasal     Status: None   Collection Time: 05/18/21 11:15 AM   Specimen: Nasal Mucosa; Nasal Swab  Result Value Ref Range Status   MRSA by PCR Next Gen NOT DETECTED NOT DETECTED Final    Comment: (NOTE) The GeneXpert MRSA Assay (FDA approved for NASAL specimens only), is one component of a comprehensive MRSA colonization surveillance program. It is not intended to diagnose MRSA infection nor to guide or monitor treatment for MRSA infections. Test performance is not FDA approved in patients less than 58 years old. Performed at [facility], [address], [place], [place] [address]   Surgical pcr screen     Status: None   Collection Time: 05/19/21  8:42 AM   Specimen: Nasal Mucosa; Nasal Swab  Result Value Ref Range Status   MRSA, PCR NEGATIVE NEGATIVE Final   Staphylococcus aureus NEGATIVE NEGATIVE Final    Comment: (NOTE) The Xpert SA Assay (FDA approved for NASAL specimens in patients 80 years of age and older), is one component of a comprehensive surveillance program. It is not intended to diagnose infection nor to guide or monitor treatment. Performed at [facility], [address] [place], [place] [address]           Radiology Studies: DG Knee 1-2 Views Right  Result Date: 05/21/2021 CLINICAL DATA:  Persistent right knee pain times multiple days EXAM: RIGHT KNEE - 1-2 VIEW COMPARISON:  None. FINDINGS: No evidence of fracture, dislocation, or joint effusion.  Chondrocalcinosis. Tricompartment degenerative change worse in the medial tibiofemoral compartment where it is moderate. Vascular calcifications. IMPRESSION: 1. No acute osseous abnormality. 2. 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 Electronically Signed   By: Tinnie Gens  Caprice Beaver MD   On: 05/21/2021 17:20        Scheduled Meds:  atorvastatin  10 mg Oral Daily   clopidogrel  75 mg Oral Q breakfast   colchicine  0.3 mg Oral Daily   diclofenac Sodium  2 g Topical QID   furosemide  40 mg Oral BID   insulin aspart  0-15 Units Subcutaneous TID WC   insulin glargine  8 Units Subcutaneous Daily   metoprolol succinate  100 mg Oral BID   sodium chloride flush  3 mL Intravenous Q12H   vitamin B-12  1,000 mcg Oral Daily   Vitamin D (Ergocalciferol)  50,000 Units Oral Q Thu   Continuous Infusions:  sodium chloride     ceFEPime (MAXIPIME) IV 2 g (05/22/21 2306)   heparin 1,700 Units/hr (05/22/21 2353)   metronidazole 500 mg (05/23/21 0505)     LOS: 8 days    Time spent: 35 minutes.     Alba Cory, MD [facility]   If 7PM-7AM, please contact night-coverage [email or web address]  05/23/2021, 7:16 AM

## 2021-05-23 NOTE — Progress Notes (Signed)
Brief Nutrition Note  RD consulted to provide diet education to pt's daughter [name]. RD initially called pt's daughter on 7/12 to provide diet education but there was no answer. Call back information was provided but pt's daughter did not call RD back.  RD called again using phone number in chart [phone]) but there was no answer again today. RD has left diet education handout in pt's room. Will add handout information to pt's AVS/Discharge Instructions that will print at discharge.   Mertie Clause, MS, RD, LDN Inpatient Clinical Dietitian Please see AMiON for contact information.

## 2021-05-23 NOTE — Progress Notes (Signed)
Physical Therapy Treatment Patient Details Name: [name] MRN: [id number] DOB: 02/28/1951 Today's Date: 05/23/2021    History of Present Illness Pt adm 7/7 with necrotic lt fifth toe with maggots. Underwent arteriogram 7/10 s/p multiple lesions angioplastied. 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 PMH - chf, htn, ckd,dm.    PT Comments    Pt received in supine, agreeable to therapy session and with good participation and fair tolerance for seated/standing exercises and short gait task at bedside for BLE strengthening. Pt limited due to symptomatic orthostatic hypotension (BP 147/83 supine and BP 102/71 standing, see VS details in comments section) and may benefit from BLE compression with ace wraps vs TED hose in future sessions to improve hemodynamic stability. Will plan to progress gait distance with chair follow next session.   Follow Up Recommendations  SNF     Equipment Recommendations  Rolling walker with 5" wheels    Recommendations for Other Services       Precautions / Restrictions Precautions Precautions: Fall;Other (comment) Precaution Comments: Very poor vision. Able to distinguish light/dark. Restrictions Weight Bearing Restrictions: No Other Position/Activity Restrictions: L Darco shoe    Mobility  Bed Mobility Overal bed mobility: Needs Assistance Bed Mobility: Supine to Sit;Sit to Supine     Supine to sit: Min assist;HOB elevated Sit to supine: Min assist   General bed mobility comments: Cues for self-assist, increased effort and heavy reliance on bed rail/features with minA for trunk stability to rise. Min A for BLE assist to supine.    Transfers Overall transfer level: Needs assistance Equipment used: Rolling walker (2 wheeled) Transfers: Sit to/from Stand Sit to Stand: From elevated surface;Min assist         General transfer comment: cues for sequencing, assist to powerup and steady. Bed height  elevated.  Ambulation/Gait Ambulation/Gait assistance: Min assist Gait Distance (Feet): 15 Feet Assistive device: Rolling walker (2 wheeled) Gait Pattern/deviations: Step-to pattern;Step-through pattern;Decreased step length - right;Decreased step length - left;Drifts right/left Gait velocity: decr   General Gait Details: Assist for balance and support, pt limited due to platform Darco post-op shoe so awkward stepping with leg length discrepancy due to shoe. Recommended he have someone bring in a thick soled shoe for opposite foot for next session. Pt symptomatic of low BP vs pain meds so deferred longer gait trial (~58ft x3 forward/backward and sidesteps at bedside)   Stairs             Wheelchair Mobility    Modified Rankin (Stroke Patients Only)       Balance Overall balance assessment: Needs assistance Sitting-balance support: Bilateral upper extremity supported;Feet supported Sitting balance-Leahy Scale: Fair Sitting balance - Comments: UE support for static sitting, pt reports discomfort with RLE bent to >70 degrees so slight posterior lean propping up on LUE due to discomfort rather than poor balance   Standing balance support: Bilateral upper extremity supported Standing balance-Leahy Scale: Poor Standing balance comment: walker and min assist for static standing                            Cognition Arousal/Alertness: Awake/alert Behavior During Therapy: Flat affect Overall Cognitive Status: No family/caregiver present to determine baseline cognitive functioning                                 General Comments: pt participatory with increased time to follow  some commands, pt reports feeling the effects of pain meds so may have slower processing also due to this.      Exercises General Exercises - Lower Extremity Ankle Circles/Pumps: AROM;Both;10 reps;Supine Long Arc Quad: AROM;AAROM;Both;10 reps;Seated (AA on RLE due to knee pain) Hip  Flexion/Marching: AROM;Both;10 reps;Standing    General Comments General comments (skin integrity, edema, etc.): BP 147/83 (97) supine; BP 128/84 (98) seated EOB; BP 102/71 (82) standing at RW; BP 104/67 (78) standing after 3 mins, c/o dizziness with each postural change, pt reports these symptoms are not new but unable to indicate how long he has been feeling dizzy with postural changes; HR 80's-90's bpm and SpO2 93-94% on RA      Pertinent Vitals/Pain Pain Assessment: Faces Faces Pain Scale: Hurts a little bit Pain Location: L foot, R knee Pain Descriptors / Indicators: Guarding;Grimacing;Discomfort Pain Intervention(s): Limited activity within patient's tolerance;Monitored during session;Premedicated before session;Repositioned (pt c/o feeling "not woozy but not normal" likely 2/2 pain meds/orthostatics)    Home Living                      Prior Function            PT Goals (current goals can now be found in the care plan section) Acute Rehab PT Goals Patient Stated Goal: Did not state PT Goal Formulation: With patient Time For Goal Achievement: 06/03/21 Progress towards PT goals: Progressing toward goals    Frequency    Min 3X/week      PT Plan Current plan remains appropriate    Co-evaluation              AM-PAC PT "6 Clicks" Mobility   Outcome Measure  Help needed turning from your back to your side while in a flat bed without using bedrails?: A Little Help needed moving from lying on your back to sitting on the side of a flat bed without using bedrails?: A Little Help needed moving to and from a bed to a chair (including a wheelchair)?: A Little Help needed standing up from a chair using your arms (e.g., wheelchair or bedside chair)?: A Lot Help needed to walk in hospital room?: A Little Help needed climbing 3-5 steps with a railing? : Total 6 Click Score: 15    End of Session Equipment Utilized During Treatment: Gait belt Activity Tolerance:  Patient tolerated treatment well;Other (comment) (orthostatic symptoms/dizziness, RN/MD notified) Patient left: in bed;with call bell/phone within reach;with bed alarm set Nurse Communication: Mobility status;Other (comment) (orthostatic, may benefit from TED hose and thick soled shoe for opposite foot) PT Visit Diagnosis: Unsteadiness on feet (R26.81);Other abnormalities of gait and mobility (R26.89);Muscle weakness (generalized) (M62.81);Pain Pain - part of body: Ankle and joints of foot;Knee (L foot, R knee)     Time: 1191-4782 PT Time Calculation (min) (ACUTE ONLY): 29 min  Charges:  $Therapeutic Exercise: 8-22 mins $Therapeutic Activity: 8-22 mins                     Kiaraliz Rafuse P., PTA Acute Rehabilitation Services Pager: [phone] Office: [phone]    Angus Palms 05/23/2021, 5:21 PM

## 2021-05-23 NOTE — Progress Notes (Signed)
OT Cancellation Note  Patient Details Name: [name] MRN: [id number] DOB: 1951/09/04   Cancelled Treatment:    Reason Eval/Treat Not Completed: Patient at procedure or test/ unavailable. Pt out of room for test/procedure. Plan to reattempt.  Raynald Kemp, OT Acute Rehabilitation Services Pager: [phone] Office: [phone]  05/23/2021, 9:26 AM

## 2021-05-23 NOTE — Progress Notes (Signed)
Orthopedic Tech Progress Note Patient Details:  [name] Apr 06, 1951 [id number]  Ortho Devices Type of Ortho Device: Postop shoe/boot Ortho Device/Splint Location: Left Foot Ortho Device/Splint Interventions: Ordered, Application   Post Interventions Patient Tolerated: Well Instructions Provided: Adjustment of device  Avleen Bordwell E Siarra Gilkerson 05/23/2021, 12:56 PM

## 2021-05-23 NOTE — Progress Notes (Addendum)
ANTICOAGULATION CONSULT NOTE - Follow Up Consult  Pharmacy Consult for IV enoxaparin + warfarin Indication: atrial fibrillation and Pta LV thrombus  No Known Allergies  Patient Measurements: Height: 5\' 8"  (172.7 cm) Weight: 91 kg (200 lb 9.9 oz) IBW/kg (Calculated) : 68.4 Heparin Dosing Weight: 87 kg  Vital Signs: Temp: 97.9 F (36.6 C) (07/15 0700) Temp Source: Oral (07/15 0700) BP: 140/90 (07/15 0700) Pulse Rate: 89 (07/15 0700)  Labs: Recent Labs    05/20/21 1932 05/21/21 0043 05/22/21 0040 05/23/21 0103  HGB  --  11.2* 11.2*  --   HCT  --  35.5* 35.1*  --   PLT  --  135* 186  --   LABPROT  --  20.3* 18.5* 18.7*  INR  --  1.7* 1.5* 1.6*  HEPARINUNFRC 0.40 0.36 0.34  --   CREATININE  --  1.17 1.20 1.25*     Estimated Creatinine Clearance: 60.2 mL/min (A) (by C-G formula based on SCr of 1.25 mg/dL (H)).   Medications:  Infusions:   sodium chloride     ceFEPime (MAXIPIME) IV 2 g (05/22/21 2306)   heparin 1,700 Units/hr (05/22/21 2353)   metronidazole 500 mg (05/23/21 0505)    Assessment: 70 yo m with afib and LV thrombus (PTA coumadin) bridged with heparin for toe amputation 7/14. Pharmacy asked to dose enoxaparin and warfarin. INR 1.6 today.  *Home dose 3mg  Tues/Sat, 6mg  AODs  Goal of Therapy:  INR 2-3 Anti-Xa level 0.6-1 units/ml 4hrs after LMWH dose given Monitor platelets by anticoagulation protocol: Yes   Plan:  Stop heparin Enoxaparin 1mg /kg SQ q12h Warfarin 6mg  x1 tonight Daily INR, CBC   8/14, PharmD, BCPS, [facility] Clinical Pharmacist [phone] Please check AMION for all [facility] Pharmacy numbers 05/23/2021

## 2021-05-24 DIAGNOSIS — R4189 Other symptoms and signs involving cognitive functions and awareness: Secondary | ICD-10-CM

## 2021-05-24 DIAGNOSIS — I96 Gangrene, not elsewhere classified: Secondary | ICD-10-CM | POA: Diagnosis not present

## 2021-05-24 DIAGNOSIS — F32A Depression, unspecified: Secondary | ICD-10-CM

## 2021-05-24 DIAGNOSIS — F4320 Adjustment disorder, unspecified: Secondary | ICD-10-CM

## 2021-05-24 LAB — CBC
HCT: 35.3 % — ABNORMAL LOW (ref 39.0–52.0)
Hemoglobin: 11.7 g/dL — ABNORMAL LOW (ref 13.0–17.0)
MCH: 28.8 pg (ref 26.0–34.0)
MCHC: 33.1 g/dL (ref 30.0–36.0)
MCV: 86.9 fL (ref 80.0–100.0)
Platelets: 213 10*3/uL (ref 150–400)
RBC: 4.06 MIL/uL — ABNORMAL LOW (ref 4.22–5.81)
RDW: 13.6 % (ref 11.5–15.5)
WBC: 8.9 10*3/uL (ref 4.0–10.5)
nRBC: 0 % (ref 0.0–0.2)

## 2021-05-24 LAB — BASIC METABOLIC PANEL
Anion gap: 4 — ABNORMAL LOW (ref 5–15)
BUN: 12 mg/dL (ref 8–23)
CO2: 33 mmol/L — ABNORMAL HIGH (ref 22–32)
Calcium: 8.5 mg/dL — ABNORMAL LOW (ref 8.9–10.3)
Chloride: 99 mmol/L (ref 98–111)
Creatinine, Ser: 1.28 mg/dL — ABNORMAL HIGH (ref 0.61–1.24)
GFR, Estimated: >60 mL/min (ref >60)
Glucose, Bld: 185 mg/dL — ABNORMAL HIGH (ref 70–99)
Potassium: 3.2 mmol/L — ABNORMAL LOW (ref 3.5–5.1)
Sodium: 136 mmol/L (ref 135–145)

## 2021-05-24 LAB — GLUCOSE, CAPILLARY
Glucose-Capillary: 212 mg/dL — ABNORMAL HIGH (ref 70–99)
Glucose-Capillary: 197 mg/dL — ABNORMAL HIGH (ref 70–99)
Glucose-Capillary: 153 mg/dL — ABNORMAL HIGH (ref 70–99)
Glucose-Capillary: 167 mg/dL — ABNORMAL HIGH (ref 70–99)

## 2021-05-24 LAB — PROTIME-INR
INR: 1.6 — ABNORMAL HIGH (ref 0.8–1.2)
Prothrombin Time: 19 seconds — ABNORMAL HIGH (ref 11.4–15.2)

## 2021-05-24 MED ORDER — GLUCERNA PO LIQD: 237.0000 mL | Freq: Two times a day (BID) | ORAL | Status: DC

## 2021-05-24 MED ORDER — WARFARIN SODIUM 3 MG PO TABS
6.0000 mg | ORAL_TABLET | Freq: Once | ORAL | Status: AC
  Administered 2021-05-24: 6 mg via ORAL
  Filled 2021-05-24: qty 2

## 2021-05-24 MED ORDER — GLUCERNA SHAKE PO LIQD
237.0000 mL | Freq: Two times a day (BID) | ORAL | Status: DC
  Administered 2021-05-24 – 2021-05-29 (×8): 237 mL via ORAL

## 2021-05-24 MED ORDER — POTASSIUM CHLORIDE CRYS ER 20 MEQ PO TBCR
40.0000 meq | EXTENDED_RELEASE_TABLET | Freq: Two times a day (BID) | ORAL | Status: DC
  Administered 2021-05-24 – 2021-05-27 (×7): 40 meq via ORAL
  Filled 2021-05-24 (×7): qty 2

## 2021-05-24 MED ORDER — SODIUM CHLORIDE 0.9 % IV SOLN
2.0000 g | Freq: Two times a day (BID) | INTRAVENOUS | Status: DC
  Administered 2021-05-24: 2 g via INTRAVENOUS
  Filled 2021-05-24: qty 2

## 2021-05-24 MED ORDER — FUROSEMIDE 40 MG PO TABS: 40.0000 mg | ORAL_TABLET | Freq: Every day | ORAL | Status: DC

## 2021-05-24 NOTE — TOC Progression Note (Addendum)
Transition of Care [facility]) - Progression Note    Patient Details  Name: CEFERINO LANG MRN: [id number] Date of Birth: 06/21/1951  Transition of Care [facility]) CM/SW Contact  Mearl Latin, LCSW Phone Number: 05/24/2021, 9:48 AM  Clinical Narrative:    9:48am-CSW spoke with patient to discuss SNF bed offers. He requested CSW contact his daughter to choose. Patient aware he will need updated COVID test and insurance authorization prior to discharge though he reported not believing in COVID.  CSW left voicemail for patient's daughter [name].   12:47pm-CSW attempted to contact patient's daughter again with no success.   1pm-Patient's daughter returned call. She requested CSW text her the facility options since she is still at work. CSW sent HIPPA compliant text.    Expected Discharge Plan: Skilled Nursing Facility Barriers to Discharge: English as a second language teacher  Expected Discharge Plan and Services Expected Discharge Plan: Skilled Nursing Facility In-house Referral: Clinical Social Work Discharge Planning Services: CM Consult Post Acute Care Choice: Skilled Nursing Facility Living arrangements for the past 2 months: Single Family Home                                       Social Determinants of Health (SDOH) Interventions    Readmission Risk Interventions No flowsheet data found.

## 2021-05-24 NOTE — Progress Notes (Signed)
PHARMACY NOTE:  ANTIMICROBIAL RENAL DOSAGE ADJUSTMENT  Current antimicrobial regimen includes a mismatch between antimicrobial dosage and estimated renal function.  As per policy approved by the Pharmacy & Therapeutics and Medical Executive Committees, the antimicrobial dosage will be adjusted accordingly.  Current antimicrobial dosage:  cefepime 2g q8h  Indication: gangrenous toe  Renal Function:  Estimated Creatinine Clearance: 58.8 mL/min (A) (by C-G formula based on SCr of 1.28 mg/dL (H)). []      On intermittent HD, scheduled: []      On CRRT    Antimicrobial dosage has been changed to:  cefepime 2g q12h  Additional comments:   Thank you for allowing pharmacy to be a part of this patient's care.   , PharmD, BCPS, [facility] Clinical Pharmacist [phone] Please check AMION for all [facility] Pharmacy numbers 05/24/2021

## 2021-05-24 NOTE — Progress Notes (Signed)
PROGRESS NOTE    [name]  JKQ:[id number] DOB: 1951-07-12 DOA: 05/15/2021 PCP: Patient, No Pcp Per (Inactive)   Brief Narrative: 70 year old with past medical history significant for chronic combined CHF, hypertension, PAF on Coumadin, left ventricular thrombus, CKD stage IIIa, diabetes who presents complaining of left pinky toe turning black.  He was found to have necrosis with maggots, was febrile with temperature to 100. 3 and admitted to the hospital.  Orthopedic and vascular surgery consulted.  Patient underwent arteriogram 7/10 with multiple lesions all angioplastied.  Patient awaiting amputation of left big toe.    Assessment & Plan:   Principal Problem:   Toe necrosis (HCC) Active Problems:   HTN (hypertension)   Acute-on-chronic kidney injury ([place])   CHF (congestive heart failure) (HCC)   Atrial fibrillation (HCC)   1-Necrotic left fifth toe/ Critical Limb ischemia:  Orthopedic and vascular surgery consulted. Underwent arteriogram 7/10 status post multiple lesions all angioplastied  with excellent result and no residual stenosis.  Treated with  IV cefepime and Flagyl for 9 days. Discontinue 48 hours post Sx.  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 Sutures will stay for 3 weeks, needs plavix at discharge. Needs heel weightbearing with Darco.    2-AKI on CKD stage III A: Baseline creatinine 1.2--1.3. Continue to monitor  3-Chronic Combined Systolic and Diastolic Heart Failure:  Echo ejection fraction 15 to 20%, has recovered in October 2021 to ejection fraction 60% Continue with Lasix, change to daily to help with orthostatics.   Hypertension: Continue with metoprolol and Lasix  Paroxysmal A. fib: on lovenox and coumadin.   History of left ventricular thrombus: Most recent echo negative for thrombus  Diabetes type 2: Start low-dose Lantus.  Continue with correction insulin HB A1c at 10.  Will likely need  insulin at discharge.   Right Knee pain; No acute osseous abnormality.  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 Voltaren gel order. colchicine for 5 days.   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 He will need to follow up with ortho.    Mild thrombocytopenia. Monitor.  Hypokalemia; replete orally.  Mood; patient report to RN, negative thought, Psych consulted.   Estimated body mass index is 30.5 kg/m as calculated from the following:   Height as of this encounter: '5\' 8"'  (1.727 m).   Weight as of this encounter: 91 kg.   DVT prophylaxis: Heparin drip Code Status: Full code Family Communication:  Daughter updated 7/14. Disposition Plan:  Status is: Inpatient  Remains inpatient appropriate because:IV treatments appropriate due to intensity of illness or inability to take PO  Dispo: The patient is from: Home              Anticipated d/c is to: SNF              Patient currently is medically stable to d/c. Awaiting SNF   Difficult to place patient No        Consultants:  Ortho Vascular  Procedures:    Antimicrobials:    Subjective: No new compalint. He has not been eating well, does not have appetite.    Objective: Vitals:   05/23/21 1900 05/23/21 2347 05/24/21 0300 05/24/21 0345  BP: 123/67 (!) 151/98  (!) 134/94  Pulse: 86 91  85  Resp: '18 15  18  ' Temp: 97.9 F (36.6 C) 98.4 F (36.9 C)  63 F (36.7 C) 98.5 F (36.9 C)  TempSrc: Oral Oral  Oral  SpO2: 95% 97%  94%  Weight:      Height:        Intake/Output Summary (Last 24 hours) at 05/24/2021 1610 Last data filed at 05/24/2021 0345 Gross per 24 hour  Intake --  Output 1850 ml  Net -1850 ml    Filed Weights   05/16/21 0129  Weight: 91 kg    Examination:  General exam: NAD Respiratory system: CTA Cardiovascular system:  S 1, S 2 RRR Gastrointestinal system: BS  present, soft,  nt Central nervous system: Alert Extremities: lSP left fifth toe amputation.   Data Reviewed: I have personally reviewed following labs and imaging studies  CBC: Recent Labs  Lab 05/19/21 0850 05/20/21 0331 05/21/21 0043 05/22/21 0040 05/24/21 0029  WBC 9.7 8.6 8.3 7.6 8.9  HGB 12.5* 11.5* 11.2* 11.2* 11.7*  HCT 37.2* 35.5* 35.5* 35.1* 35.3*  MCV 86.9 89.9 92.9 88.6 86.9  PLT 174 175 135* 186 960    Basic Metabolic Panel: Recent Labs  Lab 05/19/21 0850 05/20/21 0331 05/21/21 0043 05/22/21 0040 05/23/21 0103 05/23/21 0730 05/24/21 0029  NA 136  --  134* 136  --  135 136  K 3.6  --  3.7 3.3*  --  3.4* 3.2*  CL 102  --  105 102  --  100 99  CO2 27  --  20* 28  --  25 33*  GLUCOSE 199*  --  253* 167*  --  185* 185*  BUN 15  --  13 10  --  12 12  CREATININE 1.31*   < > 1.17 1.20 1.25* 1.19 1.28*  CALCIUM 8.7*  --  8.2* 8.4*  --  8.5* 8.5*   < > = values in this interval not displayed.    GFR: Estimated Creatinine Clearance: 58.8 mL/min (A) (by C-G formula based on SCr of 1.28 mg/dL (H)). Liver Function Tests: No results for input(s): AST, ALT, ALKPHOS, BILITOT, PROT, ALBUMIN in the last 168 hours.  No results for input(s): LIPASE, AMYLASE in the last 168 hours. No results for input(s): AMMONIA in the last 168 hours. Coagulation Profile: Recent Labs  Lab 05/20/21 0331 05/21/21 0043 05/22/21 0040 05/23/21 0103 05/24/21 0029  INR 1.9* 1.7* 1.5* 1.6* 1.6*    Cardiac Enzymes: No results for input(s): CKTOTAL, CKMB, CKMBINDEX, TROPONINI in the last 168 hours. BNP (last 3 results) No results for input(s): PROBNP in the last 8760 hours. HbA1C: Recent Labs    05/22/21 0040  HGBA1C 10.4*    CBG: Recent Labs  Lab 05/22/21 2101 05/23/21 0624 05/23/21 1133 05/23/21 1601 05/23/21 2113  GLUCAP 285* 176* 233* 233* 220*    Lipid Profile: No results for input(s): CHOL, HDL, LDLCALC, TRIG, CHOLHDL, LDLDIRECT in the last 72 hours. Thyroid  Function Tests: No results for input(s): TSH, T4TOTAL, FREET4, T3FREE, THYROIDAB in the last 72 hours. Anemia Panel: Recent Labs    05/22/21 0040  VITAMINB12 307    Sepsis Labs: No results for input(s): PROCALCITON, LATICACIDVEN in the last 168 hours.   Recent Results (from the past 240 hour(s))  SARS CORONAVIRUS 2 (TAT 6-24 HRS) Nasopharyngeal Nasopharyngeal Swab     Status: None   Collection Time: 05/15/21 11:23 PM   Specimen: Nasopharyngeal Swab  Result Value Ref Range Status   SARS Coronavirus 2 NEGATIVE NEGATIVE Final    Comment: (NOTE) SARS-CoV-2 target nucleic acids are NOT DETECTED.  The SARS-CoV-2 RNA  is generally detectable in upper and lower respiratory specimens during the acute phase of infection. Negative results do not preclude SARS-CoV-2 infection, do not rule out co-infections with other pathogens, and should not be used as the sole basis for treatment or other patient management decisions. Negative results must be combined with clinical observations, patient history, and epidemiological information. The expected result is Negative.  Fact Sheet for Patients: SugarRoll.be  Fact Sheet for Healthcare Providers: https://www.woods-mathews.com/  This test is not yet approved or cleared by the [place] FDA and  has been authorized for detection and/or diagnosis of SARS-CoV-2 by FDA under an Emergency Use Authorization (EUA). This EUA will remain  in effect (meaning this test can be used) for the duration of the COVID-19 declaration under Se ction 564(b)(1) of the Act, 21 U.S.C. section 360bbb-3(b)(1), unless the authorization is terminated or revoked sooner.  Performed at [facility], [place] [address], [place], [place] [address]   Culture, blood (routine x 2)     Status: None   Collection Time: 05/16/21  3:10 AM   Specimen: BLOOD  Result Value Ref Range Status   Specimen Description BLOOD SITE NOT  SPECIFIED  Final   Special Requests   Final    BOTTLES DRAWN AEROBIC AND ANAEROBIC Blood Culture adequate volume   Culture   Final    NO GROWTH 5 DAYS Performed at [facility], [place] [address], [place], [place] [address]    Report Status 05/21/2021 FINAL  Final  Culture, blood (routine x 2)     Status: None   Collection Time: 05/16/21  3:10 AM   Specimen: BLOOD  Result Value Ref Range Status   Specimen Description BLOOD SITE NOT SPECIFIED  Final   Special Requests   Final    BOTTLES DRAWN AEROBIC AND ANAEROBIC Blood Culture adequate volume   Culture   Final    NO GROWTH 5 DAYS Performed at [facility], [place] [address], [place], [place] [address]    Report Status 05/21/2021 FINAL  Final  MRSA Next Gen by PCR, Nasal     Status: None   Collection Time: 05/18/21 11:15 AM   Specimen: Nasal Mucosa; Nasal Swab  Result Value Ref Range Status   MRSA by PCR Next Gen NOT DETECTED NOT DETECTED Final    Comment: (NOTE) The GeneXpert MRSA Assay (FDA approved for NASAL specimens only), is one component of a comprehensive MRSA colonization surveillance program. It is not intended to diagnose MRSA infection nor to guide or monitor treatment for MRSA infections. Test performance is not FDA approved in patients less than 24 years old. Performed at [facility], [place] [address], [place], [place] [address]   Surgical pcr screen     Status: None   Collection Time: 05/19/21  8:42 AM   Specimen: Nasal Mucosa; Nasal Swab  Result Value Ref Range Status   MRSA, PCR NEGATIVE NEGATIVE Final   Staphylococcus aureus NEGATIVE NEGATIVE Final    Comment: (NOTE) The Xpert SA Assay (FDA approved for NASAL specimens in patients 8 years of age and older), is one component of a comprehensive surveillance program. It is not intended to diagnose infection nor to guide or monitor treatment. Performed at [facility], [place] [address] [place], [place] [address]           Radiology  Studies: DG HIP UNILAT WITH PELVIS 2-3 VIEWS RIGHT  Result Date: 05/23/2021 CLINICAL DATA:  Chronic right hip pain  EXAM: DG HIP (WITH OR WITHOUT PELVIS) 2-3V RIGHT COMPARISON:  None. FINDINGS: Advanced degenerative change right hip with joint space narrowing, bony sclerosis, and cystic change in the femoral head and adjacent acetabulum. 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 Negative for fracture Negative left hip.  No pelvic bone lesion identified. 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 Electronically Signed   By: Franchot Gallo M.D.   On: 05/23/2021 12:49        Scheduled Meds:  atorvastatin  10 mg Oral Daily   clopidogrel  75 mg Oral Q breakfast   colchicine  0.3 mg Oral Daily   diclofenac Sodium  2 g Topical QID   enoxaparin (LOVENOX) injection  1 mg/kg Subcutaneous Q12H   furosemide  40 mg Oral BID   insulin aspart  0-15 Units Subcutaneous TID WC   insulin glargine  8 Units Subcutaneous Daily   insulin starter kit- syringes  1 kit Other Once   living well with diabetes book   Does not apply Once   metoprolol succinate  100 mg Oral BID   potassium chloride  40 mEq Oral BID   sodium chloride flush  3 mL Intravenous Q12H   vitamin B-12  1,000 mcg Oral Daily   Vitamin D (Ergocalciferol)  50,000 Units Oral Q Thu   warfarin  6 mg Oral ONCE-1600   Warfarin - Pharmacist Dosing Inpatient   Does not apply q1600   Continuous Infusions:  sodium chloride     ceFEPime (MAXIPIME) IV     metronidazole 500 mg (05/24/21 0457)     LOS: 9 days    Time spent: 35 minutes.     Elmarie Shiley, MD [facility]   If 7PM-7AM, please contact night-coverage [email or web address]  05/24/2021, 7:28 AM

## 2021-05-24 NOTE — Progress Notes (Signed)
ANTICOAGULATION CONSULT NOTE - Follow Up Consult  Pharmacy Consult for IV enoxaparin + warfarin Indication: atrial fibrillation and Pta LV thrombus  No Known Allergies  Patient Measurements: Height: 5\' 8"  (172.7 cm) Weight: 91 kg (200 lb 9.9 oz) IBW/kg (Calculated) : 68.4 Heparin Dosing Weight: 87 kg  Vital Signs: Temp: 98.5 F (36.9 C) (07/16 0345) Temp Source: Oral (07/16 0345) BP: 134/94 (07/16 0345) Pulse Rate: 85 (07/16 0345)  Labs: Recent Labs    05/22/21 0040 05/23/21 0103 05/23/21 0730 05/24/21 0029  HGB 11.2*  --   --  11.7*  HCT 35.1*  --   --  35.3*  PLT 186  --   --  213  LABPROT 18.5* 18.7*  --  19.0*  INR 1.5* 1.6*  --  1.6*  HEPARINUNFRC 0.34  --  0.27*  --   CREATININE 1.20 1.25* 1.19 1.28*     Estimated Creatinine Clearance: 58.8 mL/min (A) (by C-G formula based on SCr of 1.28 mg/dL (H)).   Medications:  Infusions:   sodium chloride     ceFEPime (MAXIPIME) IV 2 g (05/24/21 0121)   metronidazole 500 mg (05/24/21 0457)    Assessment: 70 yo m with afib and LV thrombus (PTA coumadin) bridged with heparin for toe amputation 7/14. Pharmacy asked to dose enoxaparin and warfarin.   INR remains 1.6 today, will give boosted dose.  *Home dose 3mg  Tues/Sat, 6mg  AODs  Goal of Therapy:  INR 2-3 Anti-Xa level 0.6-1 units/ml 4hrs after LMWH dose given Monitor platelets by anticoagulation protocol: Yes   Plan:  Enoxaparin 1mg /kg SQ q12h Warfarin 6mg  x1 tonight Daily INR, CBC   8/14, PharmD, BCPS, [facility] Clinical Pharmacist [phone] Please check AMION for all [facility] Pharmacy numbers 05/24/2021

## 2021-05-24 NOTE — NC FL2 (Signed)
[place] LEVEL OF CARE SCREENING TOOL     IDENTIFICATION  Patient Name: [name] Birthdate: [birth year] Sex: male Admission Date (Current Location): 05/15/2021  [facility] and [place] Number:  Herbalist and Address:  [facility] [place], [place] [address], [place], [place] [address]      Provider Number: [id number]  Attending Physician Name and Address:  Elmarie Shiley, MD  Relative Name and Phone Number:  Harjit, Leider (Daughter)   [phone]    Current Level of Care: Hospital Recommended Level of Care: [place] Prior Approval Number:    Date Approved/Denied:   PASRR Number: [id number]  Discharge Plan: SNF    Current Diagnoses: Patient Active Problem List   Diagnosis Date Noted   Toe necrosis ([place]) 05/16/2021   Acute-on-chronic kidney injury ([place]) 05/16/2021   CHF (congestive heart failure) ([place]) 05/16/2021   Atrial fibrillation ([place]) 05/16/2021   Neck abscess 05/14/2016   Palliative care encounter 03/18/2016   Atrial fibrillation with rapid ventricular response ([place]) 03/14/2016   Noncompliance with medication regimen 03/14/2016   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   Hypertensive heart disease with heart failure (HCC)    Elevated troponin    Atrial flutter ([place]) 12/28/2015   Encounter for therapeutic drug monitoring 05/24/2014   HTN (hypertension) 83/41/9622   Chronic systolic heart failure ([place]) 05/15/2014   Obstructive sleep apnea 05/13/2014   Swelling of lower extremity 05/09/2014   Depression 05/09/2014   Type 2 diabetes, uncontrolled, with renal manifestation ([place]) 05/09/2014   CKD (chronic kidney disease), stage III ([place]) 05/09/2014   Obesity (BMI 30-39.9) 05/09/2014   Ascites 05/09/2014    Orientation RESPIRATION BLADDER Height & Weight     Self, Time, Situation, Place  Normal Continent Weight: 200 lb 9.9 oz (91 kg) Height:  '5\' 8"'  (172.7 cm)  BEHAVIORAL SYMPTOMS/MOOD  NEUROLOGICAL BOWEL NUTRITION STATUS      Continent Diet (See DC Summary)  AMBULATORY STATUS COMMUNICATION OF NEEDS Skin   Extensive Assist Verbally Surgical wounds (Closed incision on foot;)                       Personal Care Assistance Level of Assistance  Bathing, Feeding, Dressing Bathing Assistance: Maximum assistance Feeding assistance: Independent Dressing Assistance: Maximum assistance     Functional Limitations Info  Sight, Hearing, Speech Sight Info: Impaired Hearing Info: Adequate Speech Info: Adequate    SPECIAL CARE FACTORS FREQUENCY  PT (By licensed PT), OT (By licensed OT)     PT Frequency: 5x a week OT Frequency: 5x a week            Contractures Contractures Info: Not present    Additional Factors Info  Code Status, Allergies Code Status Info: Full Allergies Info: NKA           Current Medications (05/24/2021):  This is the current hospital active medication list Current Facility-Administered Medications  Medication Dose Route Frequency Provider Last Rate Last Admin   0.9 %  sodium chloride infusion  250 mL Intravenous PRN Baglia, Corrina, PA-C       acetaminophen (TYLENOL) tablet 650 mg  650 mg Oral Q6H PRN Baglia, Corrina, PA-C   650 mg at 05/18/21 2023   Or   acetaminophen (TYLENOL) suppository 650 mg  650 mg Rectal Q6H PRN Baglia, Corrina, PA-C       acetaminophen (TYLENOL) tablet 650 mg  650 mg Oral Q4H PRN Baglia, Corrina, PA-C   325 mg at  05/23/21 1136   atorvastatin (LIPITOR) tablet 10 mg  10 mg Oral Daily Baglia, Corrina, PA-C   10 mg at 05/23/21 1034   ceFEPIme (MAXIPIME) 2 g in sodium chloride 0.9 % 100 mL IVPB  2 g Intravenous Q12H Einar Grad, RPH       clopidogrel (PLAVIX) tablet 75 mg  75 mg Oral Q breakfast Baglia, Corrina, PA-C   75 mg at 05/24/21 0827   colchicine tablet 0.3 mg  0.3 mg Oral Daily Baglia, Corrina, PA-C   0.3 mg at 05/23/21 1033   diclofenac Sodium (VOLTAREN) 1 % topical gel 2 g  2 g Topical QID Baglia,  Corrina, PA-C   2 g at 05/23/21 2204   enoxaparin (LOVENOX) injection 90 mg  1 mg/kg Subcutaneous Q12H Einar Grad, RPH   90 mg at 05/24/21 0827   furosemide (LASIX) tablet 40 mg  40 mg Oral BID Baglia, Corrina, PA-C   40 mg at 05/24/21 6203   hydrALAZINE (APRESOLINE) injection 5 mg  5 mg Intravenous Q20 Min PRN Baglia, Corrina, PA-C       hydrOXYzine (ATARAX/VISTARIL) tablet 10 mg  10 mg Oral TID PRN [place], Corrina, PA-C   10 mg at 05/18/21 2215   insulin aspart (novoLOG) injection 0-15 Units  0-15 Units Subcutaneous TID WC Baglia, Corrina, PA-C   5 Units at 05/24/21 0842   insulin glargine (LANTUS) injection 8 Units  8 Units Subcutaneous Daily Baglia, Corrina, PA-C   8 Units at 05/23/21 1034   insulin starter kit- syringes (English) 1 kit  1 kit Other Once Regalado, Belkys A, MD       labetalol (NORMODYNE) injection 10 mg  10 mg Intravenous Q10 min PRN Baglia, Corrina, PA-C       living well with diabetes book MISC   Does not apply Once Regalado, Belkys A, MD       metoprolol succinate (TOPROL-XL) 24 hr tablet 100 mg  100 mg Oral BID Baglia, Corrina, PA-C   100 mg at 05/23/21 2159   metroNIDAZOLE (FLAGYL) IVPB 500 mg  500 mg Intravenous Q8H Baglia, Corrina, PA-C 100 mL/hr at 05/24/21 0457 500 mg at 05/24/21 0457   ondansetron (ZOFRAN) injection 4 mg  4 mg Intravenous Q6H PRN Baglia, Corrina, PA-C       ondansetron (ZOFRAN) injection 4 mg  4 mg Intravenous Q6H PRN Baglia, Corrina, PA-C       oxyCODONE-acetaminophen (PERCOCET/ROXICET) 5-325 MG per tablet 1 tablet  1 tablet Oral Q6H PRN Chotiner, Yevonne Aline, MD   1 tablet at 05/23/21 1137   potassium chloride SA (KLOR-CON) CR tablet 40 mEq  40 mEq Oral BID Regalado, Belkys A, MD   40 mEq at 05/24/21 0826   sodium chloride flush (NS) 0.9 % injection 3 mL  3 mL Intravenous Q12H Baglia, Corrina, PA-C   3 mL at 05/23/21 2205   sodium chloride flush (NS) 0.9 % injection 3 mL  3 mL Intravenous PRN Baglia, Corrina, PA-C       vitamin B-12  (CYANOCOBALAMIN) tablet 1,000 mcg  1,000 mcg Oral Daily Baglia, Corrina, PA-C   1,000 mcg at 05/23/21 1035   Vitamin D (Ergocalciferol) (DRISDOL) capsule 50,000 Units  50,000 Units Oral Q Thu Baglia, Corrina, PA-C   50,000 Units at 05/22/21 0941   warfarin (COUMADIN) tablet 6 mg  6 mg Oral ONCE-1600 Einar Grad, [facility] [place]       Warfarin - Pharmacist Dosing Inpatient   Does not apply [id number] Einar Grad,  [place]   Given at 05/23/21 1748     Discharge Medications: Please see discharge summary for a list of discharge medications.  Relevant Imaging Results:  Relevant Lab Results:   Additional Information SSN: [id number]; no COVID vaccines in system  [employer], LCSW

## 2021-05-24 NOTE — Consult Note (Signed)
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 male patient admitted with left toe infection.  HPI:  70 year old male who reports multiple medical issues but denies prior history of mental illness. Patient has medical history significant for chronic combined CHF, hypertension, PAF on Coumadin, left ventricular thrombus, CKD stage IIIa, diabetes and was admitted with left pinky toe turning black and awaiting amputation of left big toe. Patient is alert, awake, calm, cooperative and vehemently denies being depressed or ever saying that he wanted to die. However, he states that he reported being overwhelmed due to multiple medical issues including heart problem but says:''I never told anyone I wanted to die.'' Patient denies psychosis, delusions and says he is not ''crazy'' but will continue to appreciate treatment for his medical issues.   Past Psychiatric History: none reported by the patient  Risk to Self:  denies Risk to Others:  denies Prior Inpatient Therapy:  none Prior Outpatient Therapy:  none  Past Medical History:  Past Medical History:  Diagnosis Date   A-fib ([place])    a. (06/04/14) TEE-DC-CV; succesful; large LA 6.2 cm   A-fib (HCC)    Ascites    CHF (congestive heart failure) ([place])    Chronic systolic heart failure ([place])    a. ECHO (05/2014): EF 25-30%, diff HK, RV midly dilated and sys fx mildly/mod reduced   CKD (chronic kidney disease)     CKD (chronic kidney disease) stage 3, GFR 30-59 ml/min (HCC)    Depression    Diabetes mellitus without complication (HCC)    HTN (hypertension)    Hypertension    OSA (obstructive sleep apnea)    Type 2 diabetes mellitus ([place])     Past Surgical History:  Procedure Laterality Date   ABDOMINAL AORTOGRAM W/LOWER EXTREMITY N/A 05/19/2021   Procedure: ABDOMINAL AORTOGRAM W/LOWER EXTREMITY;  Surgeon: Marty Heck, MD;  Location: [facility];  Service: Cardiovascular;  Laterality: N/A;   AMPUTATION Left 05/22/2021   Procedure: LEFT FIFTH TOE AMPUTATION;  Surgeon: Marty Heck, MD;  Location: [place];  Service: Vascular;  Laterality: Left;   CARDIOVERSION N/A 06/04/2014   Procedure: CARDIOVERSION;  Surgeon: Larey Dresser, MD;  Location: [place];  Service: Cardiovascular;  Laterality: N/A;   NOSE SURGERY     Nasal septum surgery   PERIPHERAL VASCULAR INTERVENTION Left 05/19/2021   Procedure: PERIPHERAL VASCULAR INTERVENTION;  Surgeon: Marty Heck, MD;  Location: [facility];  Service: Cardiovascular;  Laterality: Left;   TEE WITHOUT CARDIOVERSION N/A 06/04/2014   Procedure: TRANSESOPHAGEAL ECHOCARDIOGRAM (TEE);  Surgeon: Larey Dresser, MD;  Location: [place];  Service: Cardiovascular;  Laterality: N/A;   TEE WITHOUT CARDIOVERSION N/A 01/06/2016   Procedure: TRANSESOPHAGEAL ECHOCARDIOGRAM (TEE);  Surgeon: Fay Records, MD;  Location[facility];  Service: Cardiovascular;  Laterality: N/A;   VASECTOMY     Family History:  Family History  Problem Relation Age of Onset   Heart attack Mother  deceased   Diabetes Mother    Family Psychiatric  History:  Social History:  Social History   Substance and Sexual Activity  Alcohol Use Yes   Alcohol/week: 0.0 standard drinks     Social History   Substance and Sexual Activity  Drug Use No    Social History   Socioeconomic History   Marital status: Single    Spouse name: Not on file    Number of children: Not on file   Years of education: Not on file   Highest education level: Not on file  Occupational History   Not on file  Tobacco Use   Smoking status: Some Days    Types: Cigarettes   Smokeless tobacco: Never  Substance and Sexual Activity   Alcohol use: Yes    Alcohol/week: 0.0 standard drinks   Drug use: No   Sexual activity: Not on file  Other Topics Concern   Not on file  Social History Narrative   ** Merged History Encounter **       Lives in [place] by himself. Retired from [employer] and [employer] for [employer].    Social Determinants of Health   Financial Resource Strain: Not on file  Food Insecurity: Not on file  Transportation Needs: Not on file  Physical Activity: Not on file  Stress: Not on file  Social Connections: Not on file   Additional Social History:    Allergies:  No Known Allergies  Labs:  Results for orders placed or performed during the hospital encounter of 05/15/21 (from the past 48 hour(s))  Glucose, capillary     Status: Abnormal   Collection Time: 05/22/21  3:10 PM  Result Value Ref Range   Glucose-Capillary 167 (H) 70 - 99 mg/dL    Comment: Glucose reference range applies only to samples taken after fasting for at least 8 hours.  Glucose, capillary     Status: Abnormal   Collection Time: 05/22/21  4:37 PM  Result Value Ref Range   Glucose-Capillary 150 (H) 70 - 99 mg/dL    Comment: Glucose reference range applies only to samples taken after fasting for at least 8 hours.  Glucose, capillary     Status: Abnormal   Collection Time: 05/22/21  9:01 PM  Result Value Ref Range   Glucose-Capillary 285 (H) 70 - 99 mg/dL    Comment: Glucose reference range applies only to samples taken after fasting for at least 8 hours.   Comment 1 Notify RN   Creatinine, serum     Status: Abnormal   Collection Time: 05/23/21  1:03 AM  Result Value Ref Range   Creatinine, Ser 1.25 (H) 0.61 - 1.24 mg/dL   GFR, Estimated >60 >60  mL/min    Comment: (NOTE) Calculated using the CKD-EPI Creatinine Equation (2021) Performed at [facility] [address] [place], [place] [address]   Protime-INR     Status: Abnormal   Collection Time: 05/23/21  1:03 AM  Result Value Ref Range   Prothrombin Time 18.7 (H) 11.4 - 15.2 seconds   INR 1.6 (H) 0.8 - 1.2    Comment: (NOTE) INR goal varies based on device and disease states. Performed at [facility], [place] [address] [place], [place] [address]   Glucose, capillary     Status: Abnormal   Collection Time: 05/23/21  6:24 AM  Result Value Ref Range   Glucose-Capillary 176 (H) 70 - 99 mg/dL    Comment: Glucose reference range applies only  to samples taken after fasting for at least 8 hours.   Comment 1 Notify RN   Heparin level (unfractionated)     Status: Abnormal   Collection Time: 05/23/21  7:30 AM  Result Value Ref Range   Heparin Unfractionated 0.27 (L) 0.30 - 0.70 IU/mL    Comment: (NOTE) The clinical reportable range upper limit is being lowered to >1.10 to align with the FDA approved guidance for the current laboratory assay.  If heparin results are below expected values, and patient dosage has  been confirmed, suggest follow up testing of antithrombin III levels. Performed at [facility], [place] [address], [place], [place] [address]   Basic metabolic panel     Status: Abnormal   Collection Time: 05/23/21  7:30 AM  Result Value Ref Range   Sodium 135 135 - 145 mmol/L   Potassium 3.4 (L) 3.5 - 5.1 mmol/L   Chloride 100 98 - 111 mmol/L   CO2 25 22 - 32 mmol/L   Glucose, Bld 185 (H) 70 - 99 mg/dL    Comment: Glucose reference range applies only to samples taken after fasting for at least 8 hours.   BUN 12 8 - 23 mg/dL   Creatinine, Ser 1.19 0.61 - 1.24 mg/dL   Calcium 8.5 (L) 8.9 - 10.3 mg/dL   GFR, Estimated >60 >60 mL/min    Comment: (NOTE) Calculated using the CKD-EPI Creatinine Equation (2021)    Anion gap 10 5 - 15    Comment:  Performed at [facility] [address], [place], [place] [address]  Glucose, capillary     Status: Abnormal   Collection Time: 05/23/21 11:33 AM  Result Value Ref Range   Glucose-Capillary 233 (H) 70 - 99 mg/dL    Comment: Glucose reference range applies only to samples taken after fasting for at least 8 hours.  Glucose, capillary     Status: Abnormal   Collection Time: 05/23/21  4:01 PM  Result Value Ref Range   Glucose-Capillary 233 (H) 70 - 99 mg/dL    Comment: Glucose reference range applies only to samples taken after fasting for at least 8 hours.  Glucose, capillary     Status: Abnormal   Collection Time: 05/23/21  9:13 PM  Result Value Ref Range   Glucose-Capillary 220 (H) 70 - 99 mg/dL    Comment: Glucose reference range applies only to samples taken after fasting for at least 8 hours.  Protime-INR     Status: Abnormal   Collection Time: 05/24/21 12:29 AM  Result Value Ref Range   Prothrombin Time 19.0 (H) 11.4 - 15.2 seconds   INR 1.6 (H) 0.8 - 1.2    Comment: (NOTE) INR goal varies based on device and disease states. Performed at [name], [place] [address] [place], [place] [address]   CBC     Status: Abnormal   Collection Time: 05/24/21 12:29 AM  Result Value Ref Range   WBC 8.9 4.0 - 10.5 K/uL   RBC 4.06 (L) 4.22 - 5.81 MIL/uL   Hemoglobin 11.7 (L) 13.0 - 17.0 g/dL   HCT 35.3 (L) 39.0 - 52.0 %   MCV 86.9 80.0 - 100.0 fL   MCH 28.8 26.0 - 34.0 pg   MCHC 33.1 30.0 - 36.0 g/dL   RDW 13.6 11.5 - 15.5 %   Platelets 213 150 - 400 K/uL   nRBC 0.0 0.0 - 0.2 %    Comment: Performed at [facility],  [address], [place], [place] [address]  Basic metabolic panel     Status: Abnormal   Collection Time: 05/24/21 12:29 AM  Result Value Ref Range   Sodium 136 135 - 145 mmol/L   Potassium 3.2 (L) 3.5 - 5.1 mmol/L   Chloride 99 98 - 111 mmol/L   CO2 33 (H) 22 - 32 mmol/L   Glucose, Bld 185 (H) 70 - 99 mg/dL    Comment: Glucose reference range  applies only to samples taken after fasting for at least 8 hours.   BUN 12 8 - 23 mg/dL   Creatinine, Ser 1.28 (H) 0.61 - 1.24 mg/dL   Calcium 8.5 (L) 8.9 - 10.3 mg/dL   GFR, Estimated >60 >60 mL/min    Comment: (NOTE) Calculated using the CKD-EPI Creatinine Equation (2021)    Anion gap 4 (L) 5 - 15    Comment: Performed at [facility] [address], [place], [place] [address]  Glucose, capillary     Status: Abnormal   Collection Time: 05/24/21  8:21 AM  Result Value Ref Range   Glucose-Capillary 212 (H) 70 - 99 mg/dL    Comment: Glucose reference range applies only to samples taken after fasting for at least 8 hours.  Glucose, capillary     Status: Abnormal   Collection Time: 05/24/21 11:06 AM  Result Value Ref Range   Glucose-Capillary 197 (H) 70 - 99 mg/dL    Comment: Glucose reference range applies only to samples taken after fasting for at least 8 hours.    Current Facility-Administered Medications  Medication Dose Route Frequency Provider Last Rate Last Admin   0.9 %  sodium chloride infusion  250 mL Intravenous PRN Baglia, Corrina, PA-C       acetaminophen (TYLENOL) tablet 650 mg  650 mg Oral Q6H PRN Baglia, Corrina, PA-C   650 mg at 05/18/21 2023   Or   acetaminophen (TYLENOL) suppository 650 mg  650 mg Rectal Q6H PRN Baglia, Corrina, PA-C       acetaminophen (TYLENOL) tablet 650 mg  650 mg Oral Q4H PRN Baglia, Corrina, PA-C   325 mg at 05/23/21 1136   atorvastatin (LIPITOR) tablet 10 mg  10 mg Oral Daily Baglia, Corrina, PA-C   10 mg at 05/24/21 1025   clopidogrel (PLAVIX) tablet 75 mg  75 mg Oral Q breakfast Baglia, Corrina, PA-C   75 mg at 05/24/21 0827   colchicine tablet 0.3 mg  0.3 mg Oral Daily Baglia, Corrina, PA-C   0.3 mg at 05/24/21 1026   diclofenac Sodium (VOLTAREN) 1 % topical gel 2 g  2 g Topical QID Baglia, Corrina, PA-C   2 g at 05/23/21 2204   enoxaparin (LOVENOX) injection 90 mg  1 mg/kg Subcutaneous Q12H Einar Grad, RPH   90 mg at 05/24/21  0827   feeding supplement (GLUCERNA SHAKE) (GLUCERNA SHAKE) liquid 237 mL  237 mL Oral BID BM Einar Grad, RPH   237 mL at 05/24/21 1128   [START ON 05/25/2021] furosemide (LASIX) tablet 40 mg  40 mg Oral Daily Regalado, Belkys A, MD       hydrALAZINE (APRESOLINE) injection 5 mg  5 mg Intravenous Q20 Min PRN Baglia, Corrina, PA-C       hydrOXYzine (ATARAX/VISTARIL) tablet 10 mg  10 mg Oral TID PRN Baglia, Corrina, PA-C   10 mg at 05/18/21 2215   insulin aspart (novoLOG) injection 0-15 Units  0-15 Units Subcutaneous TID WC Baglia, Corrina, PA-C  3 Units at 05/24/21 1132   insulin glargine (LANTUS) injection 8 Units  8 Units Subcutaneous Daily Baglia, Corrina, PA-C   8 Units at 05/24/21 1026   insulin starter kit- syringes (English) 1 kit  1 kit Other Once Regalado, Belkys A, MD       labetalol (NORMODYNE) injection 10 mg  10 mg Intravenous Q10 min PRN Baglia, Corrina, PA-C       living well with diabetes book MISC   Does not apply Once Regalado, Belkys A, MD       metoprolol succinate (TOPROL-XL) 24 hr tablet 100 mg  100 mg Oral BID Baglia, Corrina, PA-C   100 mg at 05/24/21 1024   ondansetron (ZOFRAN) injection 4 mg  4 mg Intravenous Q6H PRN Baglia, Corrina, PA-C       ondansetron (ZOFRAN) injection 4 mg  4 mg Intravenous Q6H PRN Baglia, Corrina, PA-C       oxyCODONE-acetaminophen (PERCOCET/ROXICET) 5-325 MG per tablet 1 tablet  1 tablet Oral Q6H PRN Chotiner, Yevonne Aline, MD   1 tablet at 05/23/21 1137   potassium chloride SA (KLOR-CON) CR tablet 40 mEq  40 mEq Oral BID Regalado, Belkys A, MD   40 mEq at 05/24/21 0826   sodium chloride flush (NS) 0.9 % injection 3 mL  3 mL Intravenous Q12H Baglia, Corrina, PA-C   3 mL at 05/23/21 2205   sodium chloride flush (NS) 0.9 % injection 3 mL  3 mL Intravenous PRN Baglia, Corrina, PA-C       vitamin B-12 (CYANOCOBALAMIN) tablet 1,000 mcg  1,000 mcg Oral Daily Baglia, Corrina, PA-C   1,000 mcg at 05/24/21 1024   Vitamin D (Ergocalciferol) (DRISDOL)  capsule 50,000 Units  50,000 Units Oral Q Thu Baglia, Corrina, PA-C   50,000 Units at 05/22/21 0941   warfarin (COUMADIN) tablet 6 mg  6 mg Oral ONCE-1600 Einar Grad, [facility]       Warfarin - Pharmacist Dosing Inpatient   Does not apply q1600 Einar Grad, [facility]   Given at 05/23/21 1748    Musculoskeletal: Strength & Muscle Tone:  not tested Gait & Station:  not tested Patient leans: N/A       Psychiatric Specialty Exam:  Presentation  General Appearance: Appropriate for Environment  Eye Contact:Good  Speech:Clear and Coherent  Speech Volume:Normal  Handedness:Right   Mood and Affect  Mood:Euthymic  Affect:Congruent   Thought Process  Thought Processes:Linear; Goal Directed  Descriptions of Associations:Intact  Orientation:Full (Time, Place and Person)  Thought Content:Logical  History of Schizophrenia/Schizoaffective disorder:No data recorded Duration of Psychotic Symptoms:No data recorded Hallucinations:Hallucinations: None  Ideas of Reference:None  Suicidal Thoughts:Suicidal Thoughts: No  Homicidal Thoughts:Homicidal Thoughts: No   Sensorium  Memory:Immediate Good; Remote Good; Recent Good  Judgment:Intact  Insight:Fair   Executive Functions  Concentration:Good  Attention Span:Good  [place] of Knowledge:Good  Language:Good   Psychomotor Activity  Psychomotor Activity:Psychomotor Activity: Normal   Assets  Assets:Desire for Improvement   Sleep  Sleep:Sleep: Fair   Physical Exam: Physical Exam Psychiatric:        Attention and Perception: Attention and perception normal.        Mood and Affect: Mood normal.        Speech: Speech normal.        Behavior: Behavior normal. Behavior is cooperative.        Thought Content: Thought content normal.        Cognition and Memory: Cognition and memory normal.  Judgment: Judgment normal.   ROS Blood pressure (!) 148/96, pulse 88, temperature 98.5 F (36.9  C), temperature source Oral, resp. rate 18, height _0  (1.727 m), weight 91 kg, SpO2 94 %. Body mass index is 30.5 kg/m.  Treatment Plan Summary: 70 year old male with multiple medical issues who denies prior/current history of mental illness. He was admitted for toe infection. Today, he is calm, cooperative, denies depression, psychosis, delusions and self harming thoughts.  Disposition: No evidence of imminent risk to self or others at present.   Patient does not meet criteria for psychiatric inpatient admission. Supportive therapy provided about ongoing stressors. Psychiatric service signing off. Re-consult as needed  Corena Pilgrim, MD 05/24/2021 12:35 PM

## 2021-05-25 DIAGNOSIS — I96 Gangrene, not elsewhere classified: Secondary | ICD-10-CM | POA: Diagnosis not present

## 2021-05-25 LAB — CBC
HCT: 37.2 % — ABNORMAL LOW (ref 39.0–52.0)
Hemoglobin: 12.2 g/dL — ABNORMAL LOW (ref 13.0–17.0)
MCH: 28.9 pg (ref 26.0–34.0)
MCHC: 32.8 g/dL (ref 30.0–36.0)
MCV: 88.2 fL (ref 80.0–100.0)
Platelets: 233 10*3/uL (ref 150–400)
RBC: 4.22 MIL/uL (ref 4.22–5.81)
RDW: 13.7 % (ref 11.5–15.5)
WBC: 8.5 10*3/uL (ref 4.0–10.5)
nRBC: 0 % (ref 0.0–0.2)

## 2021-05-25 LAB — BASIC METABOLIC PANEL
Anion gap: 7 (ref 5–15)
BUN: 28 mg/dL — ABNORMAL HIGH (ref 8–23)
CO2: 30 mmol/L (ref 22–32)
Calcium: 8.2 mg/dL — ABNORMAL LOW (ref 8.9–10.3)
Chloride: 99 mmol/L (ref 98–111)
Creatinine, Ser: 1.45 mg/dL — ABNORMAL HIGH (ref 0.61–1.24)
GFR, Estimated: 52 mL/min — ABNORMAL LOW (ref >60)
Glucose, Bld: 136 mg/dL — ABNORMAL HIGH (ref 70–99)
Potassium: 4.4 mmol/L (ref 3.5–5.1)
Sodium: 136 mmol/L (ref 135–145)

## 2021-05-25 LAB — CREATININE, SERUM
Creatinine, Ser: 1.3 mg/dL — ABNORMAL HIGH (ref 0.61–1.24)
GFR, Estimated: 59 mL/min — ABNORMAL LOW (ref >60)

## 2021-05-25 LAB — PROTIME-INR
INR: 1.6 — ABNORMAL HIGH (ref 0.8–1.2)
Prothrombin Time: 18.7 seconds — ABNORMAL HIGH (ref 11.4–15.2)

## 2021-05-25 LAB — GLUCOSE, CAPILLARY
Glucose-Capillary: 174 mg/dL — ABNORMAL HIGH (ref 70–99)
Glucose-Capillary: 217 mg/dL — ABNORMAL HIGH (ref 70–99)
Glucose-Capillary: 177 mg/dL — ABNORMAL HIGH (ref 70–99)
Glucose-Capillary: 206 mg/dL — ABNORMAL HIGH (ref 70–99)

## 2021-05-25 MED ORDER — SODIUM CHLORIDE 0.9 % IV SOLN: INTRAVENOUS | Status: AC

## 2021-05-25 MED ORDER — WARFARIN SODIUM 7.5 MG PO TABS
7.5000 mg | ORAL_TABLET | Freq: Once | ORAL | Status: AC
  Administered 2021-05-25: 7.5 mg via ORAL
  Filled 2021-05-25: qty 1

## 2021-05-25 NOTE — Progress Notes (Signed)
ANTICOAGULATION CONSULT NOTE - Follow Up Consult  Pharmacy Consult for IV enoxaparin + warfarin Indication: atrial fibrillation and Pta LV thrombus  No Known Allergies  Patient Measurements: Height: 5\' 8"  (172.7 cm) Weight: 91 kg (200 lb 9.9 oz) IBW/kg (Calculated) : 68.4 Heparin Dosing Weight: 87 kg  Vital Signs: Temp: 98.3 F (36.8 C) (07/17 0300) Temp Source: Oral (07/17 0300) BP: 161/88 (07/17 0300) Pulse Rate: 86 (07/17 0300)  Labs: Recent Labs    05/23/21 0103 05/23/21 0730 05/24/21 0029 05/25/21 0025  HGB  --   --  11.7* 12.2*  HCT  --   --  35.3* 37.2*  PLT  --   --  213 233  LABPROT 18.7*  --  19.0* 18.7*  INR 1.6*  --  1.6* 1.6*  HEPARINUNFRC  --  0.27*  --   --   CREATININE 1.25* 1.19 1.28* 1.30*     Estimated Creatinine Clearance: 57.9 mL/min (A) (by C-G formula based on SCr of 1.3 mg/dL (H)).   Medications:  Infusions:   sodium chloride      Assessment: 70 yo m with afib and LV thrombus (PTA coumadin) bridged with heparin for toe amputation 7/14. Pharmacy asked to dose enoxaparin and warfarin.   INR remains 1.6 today, will give boosted dose again tonight, CBC stable.  *Home dose 3mg  Tues/Sat, 6mg  AODs  Goal of Therapy:  INR 2-3 Anti-Xa level 0.6-1 units/ml 4hrs after LMWH dose given Monitor platelets by anticoagulation protocol: Yes   Plan:  Enoxaparin 1mg /kg SQ q12h Warfarin 7.5mg  x1 tonight Daily INR, CBC   8/14, PharmD, BCPS, [facility] Clinical Pharmacist [phone] Please check AMION for all [facility] Pharmacy numbers 05/25/2021

## 2021-05-25 NOTE — Progress Notes (Signed)
PROGRESS NOTE    [name]  PRF:[id number] DOB: 11-29-1950 DOA: 05/15/2021 PCP: Patient, No Pcp Per (Inactive)   Brief Narrative: 70 year old with past medical history significant for chronic combined CHF, hypertension, PAF on Coumadin, left ventricular thrombus, CKD stage IIIa, diabetes who presents complaining of left pinky toe turning black.  He was found to have necrosis with maggots, was febrile with temperature to 100. 3 and admitted to the hospital.  Orthopedic and vascular surgery consulted.  Patient underwent arteriogram 7/10 with multiple lesions all angioplastied.  Patient awaiting amputation of left big toe.    Assessment & Plan:   Principal Problem:   Toe necrosis (HCC) Active Problems:   HTN (hypertension)   Acute-on-chronic kidney injury ([place])   CHF (congestive heart failure) (HCC)   Atrial fibrillation (HCC)   Adjustment disorder, unspecified   1-Necrotic left fifth toe/ Critical Limb ischemia:  Orthopedic and vascular surgery consulted. Underwent arteriogram 7/10 status post multiple lesions all angioplastied  with excellent result and no residual stenosis.  Treated with  IV cefepime and Flagyl for 9 days. Discontinue 48 hours post Sx.  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 Sutures will stay for 3 weeks, needs plavix at discharge. Needs heel weightbearing with Darco.    2-AKI on CKD stage III A: Baseline creatinine 1.2--1.3. Continue to monitor Cr increase, hold lasix, IV fluids   3-Chronic Combined Systolic and Diastolic Heart Failure:  Echo ejection fraction 15 to 20%, has recovered in October 2021 to ejection fraction 60% Hold lasix due to increase AKI.  Plan to resume lower dose to help with orthostatic hypotension.   Hypertension: Continue with metoprolol and Lasix  Paroxysmal A. fib: on lovenox and coumadin.   History of left ventricular thrombus: Most recent echo negative for  thrombus  Diabetes type 2: Start low-dose Lantus.  Continue with correction insulin HB A1c at 10.  Will likely need insulin at discharge.   Right Knee pain; No acute osseous abnormality.  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 Voltaren gel order. colchicine for 5 days.   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 He will need to follow up with ortho.    Mild thrombocytopenia. Monitor.  Hypokalemia; replete orally.  Mood; patient report to RN, negative thought, Psych consulted. Clear by psych.   Estimated body mass index is 30.5 kg/m as calculated from the following:   Height as of this encounter: 5' 8" (1.727 m).   Weight as of this encounter: 91 kg.   DVT prophylaxis: Heparin drip Code Status: Full code Family Communication:  Daughter updated 7/16 Disposition Plan:  Status is: Inpatient  Remains inpatient appropriate because:IV treatments appropriate due to intensity of illness or inability to take PO  Dispo: The patient is from: Home              Anticipated d/c is to: SNF              Patient currently is medically stable to d/c. Awaiting SNF   Difficult to place patient No        Consultants:  Ortho Vascular  Procedures:    Antimicrobials:    Subjective: I explain to him importance of eating and keep him self hydrated.  He will start eating more.    Objective: Vitals:   05/24/21 1947 05/25/21 0300 05/25/21 0918 05/25/21 1117  BP: Marland Kitchen)  165/94 (!) 161/88 (!) 149/95 (!) 158/83  Pulse: 92 86 88 94  Resp: _0 Temp: 98.1 F (36.7 C) 98.3 F (36.8 C)  99.4 F (37.4 C)  TempSrc: Oral Oral  Oral  SpO2: 99% 93%  94%  Weight:      Height:        Intake/Output Summary (Last 24 hours) at 05/25/2021 1407 Last data filed at 05/25/2021 2992 Gross per 24 hour  Intake 3 ml  Output 925 ml  Net -922 ml    Filed Weights    05/16/21 0129  Weight: 91 kg    Examination:  General exam: NAD Respiratory system: CTA Cardiovascular system:  S 1, s 2 RRR Gastrointestinal system: BS present, soft, nt Central nervous system: Alert Extremities: lSP left fifth toe amputation.   Data Reviewed: I have personally reviewed following labs and imaging studies  CBC: Recent Labs  Lab 05/20/21 0331 05/21/21 0043 05/22/21 0040 05/24/21 0029 05/25/21 0025  WBC 8.6 8.3 7.6 8.9 8.5  HGB 11.5* 11.2* 11.2* 11.7* 12.2*  HCT 35.5* 35.5* 35.1* 35.3* 37.2*  MCV 89.9 92.9 88.6 86.9 88.2  PLT 175 135* 186 213 426    Basic Metabolic Panel: Recent Labs  Lab 05/21/21 0043 05/22/21 0040 05/23/21 0103 05/23/21 0730 05/24/21 0029 05/25/21 0025 05/25/21 0726  NA 134* 136  --  135 136  --  136  K 3.7 3.3*  --  3.4* 3.2*  --  4.4  CL 105 102  --  100 99  --  99  CO2 20* 28  --  25 33*  --  30  GLUCOSE 253* 167*  --  185* 185*  --  136*  BUN 13 10  --  12 12  --  28*  CREATININE 1.17 1.20 1.25* 1.19 1.28* 1.30* 1.45*  CALCIUM 8.2* 8.4*  --  8.5* 8.5*  --  8.2*    GFR: Estimated Creatinine Clearance: 51.9 mL/min (A) (by C-G formula based on SCr of 1.45 mg/dL (H)). Liver Function Tests: No results for input(s): AST, ALT, ALKPHOS, BILITOT, PROT, ALBUMIN in the last 168 hours.  No results for input(s): LIPASE, AMYLASE in the last 168 hours. No results for input(s): AMMONIA in the last 168 hours. Coagulation Profile: Recent Labs  Lab 05/21/21 0043 05/22/21 0040 05/23/21 0103 05/24/21 0029 05/25/21 0025  INR 1.7* 1.5* 1.6* 1.6* 1.6*    Cardiac Enzymes: No results for input(s): CKTOTAL, CKMB, CKMBINDEX, TROPONINI in the last 168 hours. BNP (last 3 results) No results for input(s): PROBNP in the last 8760 hours. HbA1C: No results for input(s): HGBA1C in the last 72 hours.  CBG: Recent Labs  Lab 05/24/21 1106 05/24/21 1525 05/24/21 2117 05/25/21 0636 05/25/21 1117  GLUCAP 197* 153* 167* 174* 217*     Lipid Profile: No results for input(s): CHOL, HDL, LDLCALC, TRIG, CHOLHDL, LDLDIRECT in the last 72 hours. Thyroid Function Tests: No results for input(s): TSH, T4TOTAL, FREET4, T3FREE, THYROIDAB in the last 72 hours. Anemia Panel: No results for input(s): VITAMINB12, FOLATE, FERRITIN, TIBC, IRON, RETICCTPCT in the last 72 hours.  Sepsis Labs: No results for input(s): PROCALCITON, LATICACIDVEN in the last 168 hours.   Recent Results (from the past 240 hour(s))  SARS CORONAVIRUS 2 (TAT 6-24 HRS) Nasopharyngeal Nasopharyngeal Swab     Status: None   Collection Time: 05/15/21 11:23 PM   Specimen: Nasopharyngeal Swab  Result Value Ref Range Status   SARS Coronavirus 2 NEGATIVE NEGATIVE Final  Comment: (NOTE) SARS-CoV-2 target nucleic acids are NOT DETECTED.  The SARS-CoV-2 RNA is generally detectable in upper and lower respiratory specimens during the acute phase of infection. Negative results do not preclude SARS-CoV-2 infection, do not rule out co-infections with other pathogens, and should not be used as the sole basis for treatment or other patient management decisions. Negative results must be combined with clinical observations, patient history, and epidemiological information. The expected result is Negative.  Fact Sheet for Patients: SugarRoll.be  Fact Sheet for Healthcare Providers: https://www.woods-mathews.com/  This test is not yet approved or cleared by the [place] FDA and  has been authorized for detection and/or diagnosis of SARS-CoV-2 by FDA under an Emergency Use Authorization (EUA). This EUA will remain  in effect (meaning this test can be used) for the duration of the COVID-19 declaration under Se ction 564(b)(1) of the Act, 21 U.S.C. section 360bbb-3(b)(1), unless the authorization is terminated or revoked sooner.  Performed at [facility], [place] [address] [place], [place] [address]   Culture,  blood (routine x 2)     Status: None   Collection Time: 05/16/21  3:10 AM   Specimen: BLOOD  Result Value Ref Range Status   Specimen Description BLOOD SITE NOT SPECIFIED  Final   Special Requests   Final    BOTTLES DRAWN AEROBIC AND ANAEROBIC Blood Culture adequate volume   Culture   Final    NO GROWTH 5 DAYS Performed at [facility], [place] [address], [place], [place] [address]    Report Status 05/21/2021 FINAL  Final  Culture, blood (routine x 2)     Status: None   Collection Time: 05/16/21  3:10 AM   Specimen: BLOOD  Result Value Ref Range Status   Specimen Description BLOOD SITE NOT SPECIFIED  Final   Special Requests   Final    BOTTLES DRAWN AEROBIC AND ANAEROBIC Blood Culture adequate volume   Culture   Final    NO GROWTH 5 DAYS Performed at [facility], [place] [address], [place], [place] [address]    Report Status 05/21/2021 FINAL  Final  MRSA Next Gen by PCR, Nasal     Status: None   Collection Time: 05/18/21 11:15 AM   Specimen: Nasal Mucosa; Nasal Swab  Result Value Ref Range Status   MRSA by PCR Next Gen NOT DETECTED NOT DETECTED Final    Comment: (NOTE) The GeneXpert MRSA Assay (FDA approved for NASAL specimens only), is one component of a comprehensive MRSA colonization surveillance program. It is not intended to diagnose MRSA infection nor to guide or monitor treatment for MRSA infections. Test performance is not FDA approved in patients less than 3 years old. Performed at [facility], [place] [address], [place], [place] [address]   Surgical pcr screen     Status: None   Collection Time: 05/19/21  8:42 AM   Specimen: Nasal Mucosa; Nasal Swab  Result Value Ref Range Status   MRSA, PCR NEGATIVE NEGATIVE Final   Staphylococcus aureus NEGATIVE NEGATIVE Final    Comment: (NOTE) The Xpert SA Assay (FDA approved for NASAL specimens in patients 69 years of age and older), is one component of a comprehensive surveillance program. It is not intended  to diagnose infection nor to guide or monitor treatment. Performed at [facility], [place] [address] [place], [place] [address]           Radiology Studies: No results found.  Scheduled Meds:  atorvastatin  10 mg Oral Daily   clopidogrel  75 mg Oral Q breakfast   diclofenac Sodium  2 g Topical QID   enoxaparin (LOVENOX) injection  1 mg/kg Subcutaneous Q12H   feeding supplement (GLUCERNA SHAKE)  237 mL Oral BID BM   insulin aspart  0-15 Units Subcutaneous TID WC   insulin glargine  8 Units Subcutaneous Daily   insulin starter kit- syringes  1 kit Other Once   living well with diabetes book   Does not apply Once   metoprolol succinate  100 mg Oral BID   potassium chloride  40 mEq Oral BID   sodium chloride flush  3 mL Intravenous Q12H   vitamin B-12  1,000 mcg Oral Daily   Vitamin D (Ergocalciferol)  50,000 Units Oral Q Thu   warfarin  7.5 mg Oral ONCE-1600   Warfarin - Pharmacist Dosing Inpatient   Does not apply q1600   Continuous Infusions:  sodium chloride     sodium chloride 75 mL/hr at 05/25/21 0910     LOS: 10 days    Time spent: 35 minutes.     Elmarie Shiley, MD [facility]   If 7PM-7AM, please contact night-coverage [email or web address]  05/25/2021, 2:07 PM

## 2021-05-25 NOTE — TOC Progression Note (Addendum)
Transition of Care [facility]) - Progression Note    Patient Details  Name: [name] MRN: [id number] Date of Birth: [birth year]  [facility] CM/SW Contact  Lorri Frederick, LCSW Phone Number: 05/25/2021, 10:31 AM  Clinical Narrative:    CSW spoke with pt in room regarding SNF choice.  Pt said his daughter is researching them. CSW attempted to call pt daughter regarding bed choice, no answer.  Left message.  1245: second attempt to reach daughter  1250: [facility] unable to tell if they manage this [employer] policy for SNF auth and have referred request to IT.  They will call back with determination.   1320: CSW spoke with pt in room.  Daughter not present and pt has not heard from her.  He will ask her to call when he is able to speak with her.     Expected Discharge Plan: Skilled Nursing Facility Barriers to Discharge: English as a second language teacher  Expected Discharge Plan and Services Expected Discharge Plan: Skilled Nursing Facility In-house Referral: Clinical Social Work Discharge Planning Services: CM Consult Post Acute Care Choice: Skilled Nursing Facility Living arrangements for the past 2 months: Single Family Home                                       Social Determinants of Health (SDOH) Interventions    Readmission Risk Interventions No flowsheet data found.

## 2021-05-25 NOTE — Plan of Care (Signed)
  Problem: Education: Goal: Ability to describe self-care measures that may prevent or decrease complications (Diabetes Survival Skills Education) will improve Outcome: Progressing Goal: Individualized Educational Video(s) Outcome: Progressing   Problem: Coping: Goal: Ability to adjust to condition or change in health will improve Outcome: Progressing   Problem: Fluid Volume: Goal: Ability to maintain a balanced intake and output will improve Outcome: Progressing   Problem: Health Behavior/Discharge Planning: Goal: Ability to identify and utilize available resources and services will improve Outcome: Progressing Goal: Ability to manage health-related needs will improve Outcome: Progressing   Problem: Metabolic: Goal: Ability to maintain appropriate glucose levels will improve Outcome: Progressing   Problem: Nutritional: Goal: Maintenance of adequate nutrition will improve Outcome: Progressing Goal: Progress toward achieving an optimal weight will improve Outcome: Progressing   Problem: Skin Integrity: Goal: Risk for impaired skin integrity will decrease Outcome: Progressing   Problem: Tissue Perfusion: Goal: Adequacy of tissue perfusion will improve Outcome: Progressing   Problem: Education: Goal: Knowledge of General Education information will improve Description: Including pain rating scale, medication(s)/side effects and non-pharmacologic comfort measures Outcome: Progressing   Problem: Clinical Measurements: Goal: Ability to maintain clinical measurements within normal limits will improve Outcome: Progressing Goal: Will remain free from infection Outcome: Progressing Goal: Diagnostic test results will improve Outcome: Progressing Goal: Respiratory complications will improve Outcome: Progressing Goal: Cardiovascular complication will be avoided Outcome: Progressing   Problem: Activity: Goal: Risk for activity intolerance will decrease Outcome: Progressing    Problem: Nutrition: Goal: Adequate nutrition will be maintained Outcome: Progressing   Problem: Coping: Goal: Level of anxiety will decrease Outcome: Progressing   Problem: Elimination: Goal: Will not experience complications related to bowel motility Outcome: Progressing Goal: Will not experience complications related to urinary retention Outcome: Progressing

## 2021-05-26 DIAGNOSIS — I96 Gangrene, not elsewhere classified: Secondary | ICD-10-CM | POA: Diagnosis not present

## 2021-05-26 LAB — BASIC METABOLIC PANEL
Anion gap: 5 (ref 5–15)
BUN: 13 mg/dL (ref 8–23)
CO2: 28 mmol/L (ref 22–32)
Calcium: 8.8 mg/dL — ABNORMAL LOW (ref 8.9–10.3)
Chloride: 104 mmol/L (ref 98–111)
Creatinine, Ser: 1.14 mg/dL (ref 0.61–1.24)
GFR, Estimated: >60 mL/min (ref >60)
Glucose, Bld: 165 mg/dL — ABNORMAL HIGH (ref 70–99)
Potassium: 4.7 mmol/L (ref 3.5–5.1)
Sodium: 137 mmol/L (ref 135–145)

## 2021-05-26 LAB — CBC
HCT: 38 % — ABNORMAL LOW (ref 39.0–52.0)
Hemoglobin: 12.5 g/dL — ABNORMAL LOW (ref 13.0–17.0)
MCH: 29.4 pg (ref 26.0–34.0)
MCHC: 32.9 g/dL (ref 30.0–36.0)
MCV: 89.4 fL (ref 80.0–100.0)
Platelets: 228 10*3/uL (ref 150–400)
RBC: 4.25 MIL/uL (ref 4.22–5.81)
RDW: 13.9 % (ref 11.5–15.5)
WBC: 7.7 10*3/uL (ref 4.0–10.5)
nRBC: 0 % (ref 0.0–0.2)

## 2021-05-26 LAB — GLUCOSE, CAPILLARY
Glucose-Capillary: 176 mg/dL — ABNORMAL HIGH (ref 70–99)
Glucose-Capillary: 171 mg/dL — ABNORMAL HIGH (ref 70–99)
Glucose-Capillary: 157 mg/dL — ABNORMAL HIGH (ref 70–99)
Glucose-Capillary: 158 mg/dL — ABNORMAL HIGH (ref 70–99)

## 2021-05-26 LAB — PROTIME-INR
INR: 1.7 — ABNORMAL HIGH (ref 0.8–1.2)
Prothrombin Time: 20.2 seconds — ABNORMAL HIGH (ref 11.4–15.2)

## 2021-05-26 MED ORDER — WARFARIN SODIUM 7.5 MG PO TABS
7.5000 mg | ORAL_TABLET | Freq: Once | ORAL | Status: AC
  Administered 2021-05-26: 7.5 mg via ORAL
  Filled 2021-05-26: qty 1

## 2021-05-26 NOTE — Progress Notes (Signed)
PROGRESS NOTE    [name]  HXT:[id number] DOB: August 07, 1951 DOA: 05/15/2021 PCP: Patient, No Pcp Per (Inactive)   Brief Narrative: 70 year old with past medical history significant for chronic combined CHF, hypertension, PAF on Coumadin, left ventricular thrombus, CKD stage IIIa, diabetes who presents complaining of left pinky toe turning black.  He was found to have necrosis with maggots, was febrile with temperature to 100. 3 and admitted to the hospital.  Orthopedic and vascular surgery consulted.  Patient underwent arteriogram 7/10 with multiple lesions all angioplastied.  Patient awaiting amputation of left big toe.    Assessment & Plan:   Principal Problem:   Toe necrosis (HCC) Active Problems:   HTN (hypertension)   Acute-on-chronic kidney injury ([place])   CHF (congestive heart failure) (HCC)   Atrial fibrillation (HCC)   Adjustment disorder, unspecified   1-Necrotic left fifth toe/ Critical Limb ischemia:  Orthopedic and vascular surgery consulted. Underwent arteriogram 7/10 status post multiple lesions all angioplastied  with excellent result and no residual stenosis.  Treated with  IV cefepime and Flagyl for 9 days. Discontinue 48 hours post Sx.  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 Sutures will stay for 3 weeks, needs plavix at discharge. Needs heel weightbearing with Darco.    2-AKI on CKD stage III A: Baseline creatinine 1.2--1.3. Continue to monitor Cr increase, hold lasix, received IV fluids  Renal function improved.   3-Chronic Combined Systolic and Diastolic Heart Failure:  Echo ejection fraction 15 to 20%, has recovered in October 2021 to ejection fraction 60% Hold lasix due to increase AKI.  Plan to resume lower dose to help with orthostatic hypotension.  Holding lasix.   Hypertension: Continue with metoprolol and Lasix  Paroxysmal A. fib: on lovenox and coumadin.   History of left ventricular  thrombus: Most recent echo negative for thrombus  Diabetes type 2: Start low-dose Lantus.  Continue with correction insulin HB A1c at 10.  Will likely need insulin at discharge.   Right Knee pain; No acute osseous abnormality.  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 Voltaren gel order. colchicine for 5 days.   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 He will need to follow up with ortho.    Mild thrombocytopenia. Monitor.  Hypokalemia; replete orally.  Mood; patient report to RN, negative thought, Psych consulted. Clear by psych.   Estimated body mass index is 30.5 kg/m as calculated from the following:   Height as of this encounter: '5\' 8"'  (1.727 m).   Weight as of this encounter: 91 kg.   DVT prophylaxis: Heparin drip Code Status: Full code Family Communication:  Daughter updated 7/16 Disposition Plan:  Status is: Inpatient  Remains inpatient appropriate because:IV treatments appropriate due to intensity of illness or inability to take PO  Dispo: The patient is from: Home              Anticipated d/c is to: SNF              Patient currently is medically stable to d/c. Awaiting SNF   Difficult to place patient No        Consultants:  Ortho Vascular  Procedures:    Antimicrobials:    Subjective: He is eating more.  Report dizziness, chronic problem for him. He has had dizziness for years.     Objective: Vitals:   05/25/21 2307 05/26/21  0354 05/26/21 0722 05/26/21 1138  BP: (!) 151/90 (!) 151/95 (!) 158/90 (!) 158/92  Pulse: 80 75 79 71  Resp: '20 16 18 16  ' Temp: 98.3 F (36.8 C) (!) 96.5 F (35.8 C) (!) 97.5 F (36.4 C) 97.6 F (36.4 C)  TempSrc: Oral Axillary  Oral  SpO2: 96% 97% 96% 94%  Weight:      Height:        Intake/Output Summary (Last 24 hours) at 05/26/2021 1329 Last data filed at 05/26/2021 1130 Gross per 24  hour  Intake 662.04 ml  Output 500 ml  Net 162.04 ml    Filed Weights   05/16/21 0129  Weight: 91 kg    Examination:  General exam: NAD Respiratory system: CTA Cardiovascular system:  S 1, S 2 RRR Gastrointestinal system: BS present, soft, nt Central nervous system: Alert Extremities: lSP left fifth toe amputation.   Data Reviewed: I have personally reviewed following labs and imaging studies  CBC: Recent Labs  Lab 05/21/21 0043 05/22/21 0040 05/24/21 0029 05/25/21 0025 05/26/21 0606  WBC 8.3 7.6 8.9 8.5 7.7  HGB 11.2* 11.2* 11.7* 12.2* 12.5*  HCT 35.5* 35.1* 35.3* 37.2* 38.0*  MCV 92.9 88.6 86.9 88.2 89.4  PLT 135* 186 213 233 633    Basic Metabolic Panel: Recent Labs  Lab 05/22/21 0040 05/23/21 0103 05/23/21 0730 05/24/21 0029 05/25/21 0025 05/25/21 0726 05/26/21 0606  NA 136  --  135 136  --  136 137  K 3.3*  --  3.4* 3.2*  --  4.4 4.7  CL 102  --  100 99  --  99 104  CO2 28  --  25 33*  --  30 28  GLUCOSE 167*  --  185* 185*  --  136* 165*  BUN 10  --  12 12  --  28* 13  CREATININE 1.20   < > 1.19 1.28* 1.30* 1.45* 1.14  CALCIUM 8.4*  --  8.5* 8.5*  --  8.2* 8.8*   < > = values in this interval not displayed.    GFR: Estimated Creatinine Clearance: 66 mL/min (by C-G formula based on SCr of 1.14 mg/dL). Liver Function Tests: No results for input(s): AST, ALT, ALKPHOS, BILITOT, PROT, ALBUMIN in the last 168 hours.  No results for input(s): LIPASE, AMYLASE in the last 168 hours. No results for input(s): AMMONIA in the last 168 hours. Coagulation Profile: Recent Labs  Lab 05/22/21 0040 05/23/21 0103 05/24/21 0029 05/25/21 0025 05/26/21 0606  INR 1.5* 1.6* 1.6* 1.6* 1.7*    Cardiac Enzymes: No results for input(s): CKTOTAL, CKMB, CKMBINDEX, TROPONINI in the last 168 hours. BNP (last 3 results) No results for input(s): PROBNP in the last 8760 hours. HbA1C: No results for input(s): HGBA1C in the last 72 hours.  CBG: Recent Labs  Lab  05/25/21 1117 05/25/21 1534 05/25/21 2046 05/26/21 0615 05/26/21 1141  GLUCAP 217* 177* 206* 176* 171*    Lipid Profile: No results for input(s): CHOL, HDL, LDLCALC, TRIG, CHOLHDL, LDLDIRECT in the last 72 hours. Thyroid Function Tests: No results for input(s): TSH, T4TOTAL, FREET4, T3FREE, THYROIDAB in the last 72 hours. Anemia Panel: No results for input(s): VITAMINB12, FOLATE, FERRITIN, TIBC, IRON, RETICCTPCT in the last 72 hours.  Sepsis Labs: No results for input(s): PROCALCITON, LATICACIDVEN in the last 168 hours.   Recent Results (from the past 240 hour(s))  MRSA Next Gen by PCR, Nasal     Status: None   Collection Time: 05/18/21 11:15 AM  Specimen: Nasal Mucosa; Nasal Swab  Result Value Ref Range Status   MRSA by PCR Next Gen NOT DETECTED NOT DETECTED Final    Comment: (NOTE) The GeneXpert MRSA Assay (FDA approved for NASAL specimens only), is one component of a comprehensive MRSA colonization surveillance program. It is not intended to diagnose MRSA infection nor to guide or monitor treatment for MRSA infections. Test performance is not FDA approved in patients less than 55 years old. Performed at [facility], [place] [address], [place], [place] [address]   Surgical pcr screen     Status: None   Collection Time: 05/19/21  8:42 AM   Specimen: Nasal Mucosa; Nasal Swab  Result Value Ref Range Status   MRSA, PCR NEGATIVE NEGATIVE Final   Staphylococcus aureus NEGATIVE NEGATIVE Final    Comment: (NOTE) The Xpert SA Assay (FDA approved for NASAL specimens in patients 22 years of age and older), is one component of a comprehensive surveillance program. It is not intended to diagnose infection nor to guide or monitor treatment. Performed at [facility], [place] [address] [place], [place] [address]           Radiology Studies: No results found.      Scheduled Meds:  atorvastatin  10 mg Oral Daily   clopidogrel  75 mg Oral Q breakfast    diclofenac Sodium  2 g Topical QID   enoxaparin (LOVENOX) injection  1 mg/kg Subcutaneous Q12H   feeding supplement (GLUCERNA SHAKE)  237 mL Oral BID BM   insulin aspart  0-15 Units Subcutaneous TID WC   insulin glargine  8 Units Subcutaneous Daily   insulin starter kit- syringes  1 kit Other Once   living well with diabetes book   Does not apply Once   metoprolol succinate  100 mg Oral BID   potassium chloride  40 mEq Oral BID   sodium chloride flush  3 mL Intravenous Q12H   vitamin B-12  1,000 mcg Oral Daily   Vitamin D (Ergocalciferol)  50,000 Units Oral Q Thu   warfarin  7.5 mg Oral ONCE-1600   Warfarin - Pharmacist Dosing Inpatient   Does not apply q1600   Continuous Infusions:  sodium chloride       LOS: 11 days    Time spent: 35 minutes.     Elmarie Shiley, MD [facility]   If 7PM-7AM, please contact night-coverage [email or web address]  05/26/2021, 1:29 PM

## 2021-05-26 NOTE — Progress Notes (Signed)
Inpatient Diabetes Program Recommendations  AACE/ADA: New Consensus Statement on Inpatient Glycemic Control  Target Ranges:  Prepandial:   less than 140 mg/dL      Peak postprandial:   less than 180 mg/dL (1-2 hours)      Critically ill patients:  140 - 180 mg/dL   Results for [name], [name] (MRN [id number]) as of 05/26/2021 09:12  Ref. Range 05/25/2021 06:36 05/25/2021 11:17 05/25/2021 15:34 05/25/2021 20:46 05/26/2021 06:15  Glucose-Capillary Latest Ref Range: 70 - 99 mg/dL 179 (H) 150 (H) 569 (H) 206 (H) 176 (H)    Review of Glycemic Control  Diabetes history: DM2 Outpatient Diabetes medications: None Current orders for Inpatient glycemic control: Lantus 8 units daily, Novolog 0-15 units TID with meals   Inpatient Diabetes Program Recommendations:     Insulin: Please consider ordering Novolog 3 units TID with meals for meal coverage if patient eats at least 50% of meals.  Diet: Please discontinue Regular diet and order Carb Modified diet.    HbgA1C: Current A1C 10.4% on 05/22/21 indicating an average glucose of 252 mg/dl. Will need to be prescribed DM medications at discharge. Also, please provide Rx for glucometer and testing supplies.  Thanks, Orlando Penner, RN, MSN, CDE Diabetes Coordinator Inpatient Diabetes Program [phone] (Team Pager from 8am to 5pm)

## 2021-05-26 NOTE — Plan of Care (Signed)

## 2021-05-26 NOTE — Progress Notes (Signed)
ANTICOAGULATION CONSULT NOTE - Follow Up Consult  Pharmacy Consult for IV enoxaparin + warfarin Indication: atrial fibrillation and Pta LV thrombus  No Known Allergies  Patient Measurements: Height: 5\' 8"  (172.7 cm) Weight: 91 kg (200 lb 9.9 oz) IBW/kg (Calculated) : 68.4 Heparin Dosing Weight: 87 kg  Vital Signs: Temp: 97.5 F (36.4 C) (07/18 0722) Temp Source: Axillary (07/18 0354) BP: 158/90 (07/18 0722) Pulse Rate: 79 (07/18 0722)  Labs: Recent Labs    05/24/21 0029 05/25/21 0025 05/25/21 0726 05/26/21 0606  HGB 11.7* 12.2*  --  12.5*  HCT 35.3* 37.2*  --  38.0*  PLT 213 233  --  228  LABPROT 19.0* 18.7*  --  20.2*  INR 1.6* 1.6*  --  1.7*  CREATININE 1.28* 1.30* 1.45* 1.14     Estimated Creatinine Clearance: 66 mL/min (by C-G formula based on SCr of 1.14 mg/dL).   Medications:  Infusions:   sodium chloride      Assessment: 70 yo m with afib and LV thrombus (PTA coumadin) bridged with heparin for toe amputation 7/14. Pharmacy asked to dose enoxaparin and warfarin.   INR 1.7 subtherapeutic today while on warfarin and enoxaparin bridge. Pt dispo to SNF and awaiting insurance approval, will try to get patient to goal prior to SNF discharge.   *Home dose 3mg  Tues/Sat, 6mg  AODs  Goal of Therapy:  INR 2-3 Anti-Xa level 0.6-1 units/ml 4hrs after LMWH dose given Monitor platelets by anticoagulation protocol: Yes   Plan:  Warfarin 7.5mg  x1 tonight Continue enoxaparin 1mg /kg SQ q12h unitl INR at goal for 24 h Daily INR, CBC  Thank you for involving pharmacy in this patient's care.  8/14, PharmD PGY1 Ambulatory Care Pharmacy Resident 05/26/2021 10:01 AM  **Pharmacist phone directory can be found on [email or web address] listed under [facility]**

## 2021-05-27 DIAGNOSIS — I96 Gangrene, not elsewhere classified: Secondary | ICD-10-CM | POA: Diagnosis not present

## 2021-05-27 LAB — CBC
HCT: 36.4 % — ABNORMAL LOW (ref 39.0–52.0)
Hemoglobin: 11.7 g/dL — ABNORMAL LOW (ref 13.0–17.0)
MCH: 29 pg (ref 26.0–34.0)
MCHC: 32.1 g/dL (ref 30.0–36.0)
MCV: 90.1 fL (ref 80.0–100.0)
Platelets: 254 10*3/uL (ref 150–400)
RBC: 4.04 MIL/uL — ABNORMAL LOW (ref 4.22–5.81)
RDW: 14 % (ref 11.5–15.5)
WBC: 9.2 10*3/uL (ref 4.0–10.5)
nRBC: 0 % (ref 0.0–0.2)

## 2021-05-27 LAB — BASIC METABOLIC PANEL
Anion gap: 7 (ref 5–15)
BUN: 18 mg/dL (ref 8–23)
CO2: 31 mmol/L (ref 22–32)
Calcium: 9.3 mg/dL (ref 8.9–10.3)
Chloride: 99 mmol/L (ref 98–111)
Creatinine, Ser: 1.17 mg/dL (ref 0.61–1.24)
GFR, Estimated: >60 mL/min (ref >60)
Glucose, Bld: 178 mg/dL — ABNORMAL HIGH (ref 70–99)
Potassium: 5.2 mmol/L — ABNORMAL HIGH (ref 3.5–5.1)
Sodium: 137 mmol/L (ref 135–145)

## 2021-05-27 LAB — GLUCOSE, CAPILLARY
Glucose-Capillary: 134 mg/dL — ABNORMAL HIGH (ref 70–99)
Glucose-Capillary: 164 mg/dL — ABNORMAL HIGH (ref 70–99)
Glucose-Capillary: 185 mg/dL — ABNORMAL HIGH (ref 70–99)
Glucose-Capillary: 180 mg/dL — ABNORMAL HIGH (ref 70–99)

## 2021-05-27 LAB — CREATININE, SERUM
Creatinine, Ser: 1.21 mg/dL (ref 0.61–1.24)
GFR, Estimated: >60 mL/min (ref >60)

## 2021-05-27 LAB — PROTIME-INR
INR: 2 — ABNORMAL HIGH (ref 0.8–1.2)
Prothrombin Time: 22.7 seconds — ABNORMAL HIGH (ref 11.4–15.2)

## 2021-05-27 MED ORDER — GLUCERNA SHAKE PO LIQD: 237.0000 mL | Freq: Two times a day (BID) | ORAL | 0 refills | Status: DC

## 2021-05-27 MED ORDER — CLOPIDOGREL BISULFATE 75 MG PO TABS: 75.0000 mg | ORAL_TABLET | Freq: Every day | ORAL | 3 refills | Status: DC

## 2021-05-27 MED ORDER — ATORVASTATIN CALCIUM 10 MG PO TABS: 10.0000 mg | ORAL_TABLET | Freq: Every day | ORAL | 3 refills | Status: DC

## 2021-05-27 MED ORDER — HYDROXYZINE HCL 10 MG PO TABS: 10.0000 mg | ORAL_TABLET | Freq: Three times a day (TID) | ORAL | 0 refills | Status: DC | PRN

## 2021-05-27 MED ORDER — VITAMIN D (ERGOCALCIFEROL) 1.25 MG (50000 UNIT) PO CAPS: 50000.0000 [IU] | ORAL_CAPSULE | ORAL | 0 refills | Status: DC

## 2021-05-27 MED ORDER — INSULIN GLARGINE 100 UNIT/ML ~~LOC~~ SOLN: 8.0000 [IU] | Freq: Every day | SUBCUTANEOUS | 11 refills | Status: DC

## 2021-05-27 MED ORDER — CLOTRIMAZOLE 1 % EX CREA: 1.0000 "application " | TOPICAL_CREAM | Freq: Two times a day (BID) | CUTANEOUS | 0 refills | Status: DC

## 2021-05-27 MED ORDER — LOSARTAN POTASSIUM 50 MG PO TABS
50.0000 mg | ORAL_TABLET | Freq: Every day | ORAL | Status: DC
  Administered 2021-05-27 – 2021-05-29 (×3): 50 mg via ORAL
  Filled 2021-05-27 (×3): qty 1

## 2021-05-27 MED ORDER — WARFARIN SODIUM 3 MG PO TABS
6.0000 mg | ORAL_TABLET | Freq: Once | ORAL | Status: AC
  Administered 2021-05-27: 6 mg via ORAL
  Filled 2021-05-27: qty 2

## 2021-05-27 MED ORDER — CLOTRIMAZOLE 1 % EX CREA
1.0000 "application " | TOPICAL_CREAM | Freq: Two times a day (BID) | CUTANEOUS | Status: DC
  Administered 2021-05-27 (×2): 1 via TOPICAL
  Filled 2021-05-27: qty 15

## 2021-05-27 MED ORDER — DICLOFENAC SODIUM 1 % EX GEL: 2.0000 g | Freq: Four times a day (QID) | CUTANEOUS | 0 refills | Status: DC

## 2021-05-27 MED ORDER — WARFARIN SODIUM 6 MG PO TABS: 6.0000 mg | ORAL_TABLET | ORAL | 0 refills | Status: DC

## 2021-05-27 MED ORDER — CYANOCOBALAMIN 1000 MCG PO TABS: 1000.0000 ug | ORAL_TABLET | Freq: Every day | ORAL | 0 refills | Status: DC

## 2021-05-27 MED ORDER — FUROSEMIDE 20 MG PO TABS: 20.0000 mg | ORAL_TABLET | Freq: Every day | ORAL | 11 refills | Status: DC

## 2021-05-27 NOTE — Progress Notes (Signed)
Physical Therapy Treatment Patient Details Name: [name] MRN: [id number] DOB: 1951-07-11 Today's Date: 05/27/2021    History of Present Illness Pt adm 7/7 with necrotic lt fifth toe with maggots. Underwent arteriogram 7/10 s/p multiple lesions angioplastied. 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 PMH - chf, htn, ckd,dm.    PT Comments    Pt tolerated treatment well today, demonstrating increased ambulation distance. Pt required min A on transfers, ambulation, and sit to supine. Pt was not orthostatic in standing, with BP from 155/97 (rest) to 150/101 (standing). Pt demonstrates leg discrepancy and lateral weight shift when ambulating due to [name]. Verbal cues provided to navigate RW around obstacles. Pt has decreased ROM in RLE during ambulation and exercises. 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 Recommend pt continues with pt to improve bed mobility, gait, and LE strength and ROM.  Follow Up Recommendations  SNF     Equipment Recommendations  Rolling walker with 5" wheels    Recommendations for Other Services       Precautions / Restrictions Precautions Precautions: Fall;Other (comment) Precaution Comments: Very poor vision. Able to distinguish light/dark. Restrictions Other Position/Activity Restrictions: L [name] shoe    Mobility  Bed Mobility Overal bed mobility: Needs Assistance Bed Mobility: Supine to Sit;Sit to Supine     Supine to sit: HOB elevated;Min assist Sit to supine: Min guard;HOB elevated   General bed mobility comments: Pt in bed upon arrival. Resting vitals BP 155/97 (116) HR 86. min A to lift trunk to achieve seated position with use of bedrails for sit to supine.    Transfers Overall transfer level: Needs assistance Equipment used: Rolling walker (2 wheeled) Transfers: Sit to/from Stand Sit to Stand: From elevated surface;Min assist         General transfer comment: Vitals standing- 150/101 (115)  HR 95. Verbal cues provided for transfer. min A to power up to standing  Ambulation/Gait Ambulation/Gait assistance: Min assist Gait Distance (Feet): 60 Feet Assistive device: Rolling walker (2 wheeled) Gait Pattern/deviations: Step-to pattern;Decreased step length - left;Decreased step length - right   Gait velocity interpretation: <1.31 ft/sec, indicative of household ambulator General Gait Details: min A for additional support and guidance of RW. [name] contributed to leg discrepancy and lateral sway when weight shifting on L. Slight hip circumduction noted in swing phase with little movement of knee during gait. Verbal cues provided to improve navigation of RW around obstacles.   Stairs             Wheelchair Mobility    Modified Rankin (Stroke Patients Only)       Balance Overall balance assessment: Needs assistance Sitting-balance support: Single extremity supported;Feet supported Sitting balance-Leahy Scale: Fair Sitting balance - Comments: Support with LUE. Pt has difficulty/pain knee flexion in RLE.                                    Cognition Arousal/Alertness: Awake/alert Behavior During Therapy: Flat affect Overall Cognitive Status: No family/caregiver present to determine baseline cognitive functioning                                 General Comments: Increased response time to commands      Exercises General Exercises - Lower Extremity Hip Flexion/Marching: AROM;Left;5 reps;Right;Other (comment) (3reps RLE pain and decreased ROM)  General Comments        Pertinent Vitals/Pain      Home Living                      Prior Function            PT Goals (current goals can now be found in the care plan section) Progress towards PT goals: Progressing toward goals    Frequency    Min 3X/week      PT Plan Current plan remains appropriate    Co-evaluation              AM-PAC PT "6 Clicks"  Mobility   Outcome Measure  Help needed turning from your back to your side while in a flat bed without using bedrails?: A Little Help needed moving from lying on your back to sitting on the side of a flat bed without using bedrails?: A Little Help needed moving to and from a bed to a chair (including a wheelchair)?: A Little Help needed standing up from a chair using your arms (e.g., wheelchair or bedside chair)?: A Little Help needed to walk in hospital room?: A Little Help needed climbing 3-5 steps with a railing? : A Lot 6 Click Score: 17    End of Session Equipment Utilized During Treatment: Gait belt Activity Tolerance: Patient tolerated treatment well Patient left: in bed;with bed alarm set;with call bell/phone within reach Nurse Communication: Mobility status PT Visit Diagnosis: Unsteadiness on feet (R26.81);Other abnormalities of gait and mobility (R26.89);Muscle weakness (generalized) (M62.81)     Time: 2458-0998 PT Time Calculation (min) (ACUTE ONLY): 37 min  Charges:  $Gait Training: 8-22 mins $Therapeutic Activity: 8-22 mins                     [name], SPT Acute Rehab: [phone]    [name] 05/27/2021, 2:10 PM

## 2021-05-27 NOTE — Progress Notes (Signed)
ANTICOAGULATION CONSULT NOTE - Follow Up Consult  Pharmacy Consult for IV enoxaparin + warfarin Indication: atrial fibrillation and Pta LV thrombus  No Known Allergies  Patient Measurements: Height: 5\' 8"  (172.7 cm) Weight: 91 kg (200 lb 9.9 oz) IBW/kg (Calculated) : 68.4 Heparin Dosing Weight: 87 kg  Vital Signs: Temp: 98 F (36.7 C) (07/19 1104) Temp Source: Oral (07/19 0429) BP: 155/97 (07/19 1104) Pulse Rate: 86 (07/19 1104)  Labs: Recent Labs    05/25/21 0025 05/25/21 0726 05/26/21 0606 05/27/21 0053  HGB 12.2*  --  12.5* 11.7*  HCT 37.2*  --  38.0* 36.4*  PLT 233  --  228 254  LABPROT 18.7*  --  20.2* 22.7*  INR 1.6*  --  1.7* 2.0*  CREATININE 1.30* 1.45* 1.14 1.21     Estimated Creatinine Clearance: 62.2 mL/min (by C-G formula based on SCr of 1.21 mg/dL).   Medications:  Infusions:   sodium chloride      Assessment: 70 yo m with afib and LV thrombus (PTA coumadin) bridged with heparin for toe amputation 7/14. Pharmacy asked to dose enoxaparin and warfarin.   INR 2 now therapeutic today on warfarin and enoxaparin bridge. Pt dispo to SNF awaiting insurance approval.   *Home dose 3mg  Tues/Sat, 6mg  AODs  Goal of Therapy:  INR 2-3 Anti-Xa level 0.6-1 units/ml 4hrs after LMWH dose given Monitor platelets by anticoagulation protocol: Yes   Plan:  Warfarin 6mg  x1 tonight Stop lovenox  Daily INR, CBC  Thank you for involving pharmacy in this patient's care.  8/14 PharmD., BCPS Clinical Pharmacist 05/27/2021 12:04 PM  **Pharmacist phone directory can be found on [email or web address] listed under [facility]**

## 2021-05-27 NOTE — Discharge Summary (Addendum)
Physician Discharge Summary  [name] EQA:[id number] DOB: 03-10-1951 DOA: 05/15/2021  PCP: Patient, No Pcp Per (Inactive)  Admit date: 05/15/2021 Discharge date: 05/29/2021  Admitted From: Home  Disposition:  SNF  Patient recently discharged on 7/19 by Dr. Sunnie Nielsen, discharge summary as below, patient seen today 7/21, currently stable for discharge  [name]. Elvera Lennox, MD, PhD [facility]  Recommendations for Outpatient Follow-up:  Follow up with PCP in 1-2 weeks Please obtain BMP/CBC in one week Needs follow up with vascular surgery post sx.  Needs to follow up with orthopedic for hip pain and knee pain.   Discharge Condition: Stable.  CODE STATUS: Full Code Diet recommendation: Carb modified diet    Brief/Interim Summary: 70 year old with past medical history significant for chronic combined CHF, hypertension, PAF on Coumadin, left ventricular thrombus, CKD stage IIIa, diabetes who presents complaining of left pinky toe turning black.  He was found to have necrosis with maggots, was febrile with temperature to 100. 3 and admitted to the hospital.  Orthopedic and vascular surgery consulted.  Patient underwent arteriogram 7/10 with multiple lesions all angioplastied.  Patient underwent amputation of left big toe 7/14. Awaiting SNF>   1-Necrotic left fifth toe/ Critical Limb ischemia: Orthopedic and vascular surgery consulted. Underwent arteriogram 7/10 status post multiple lesions all angioplastied  with excellent result and no residual stenosis. Treated with  IV cefepime and Flagyl for 9 days. Discontinue 48 hours post Sx. 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 Sutures will stay for 3 weeks, needs plavix at discharge. Needs heel weightbearing with Darco.     2-AKI on CKD stage III A: Baseline creatinine 1.2--1.3. Continue to monitor Cr increase, held lasix, received IV fluids  Renal function improved.     3-Chronic Combined Systolic and Diastolic Heart Failure: Echo ejection fraction 15 to 20%, has recovered in October 2021 to ejection fraction 60% Hold lasix due to increase AKI. Plan to resume lower dose to help with orthostatic hypotension. Resume lower dose lasix at discharge.    Hypertension: Continue with metoprolol and resume Lasix at discharge.    Paroxysmal A. fib: Resume  coumadin home dose.    History of left ventricular thrombus: Most recent echo negative for thrombus   Diabetes type 2: Continue with  Lantus.  Continue with correction insulin HB A1c at 10.    Right Knee pain; No acute osseous abnormality.  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 Voltaren gel order. Completed colchicine.    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 He will need to follow up with ortho.   Mild thrombocytopenia. Monitor. Hypokalemia; replete orally.  Mood; patient report to RN, negative thought, Psych consulted. Clear by psych. Mild elevation of potasium; repeat labs in am. Discontinue potassium,   Estimated body mass index is 30.5 kg/m as calculated from the following:   Height as of this encounter: 5\' 8"  (1.727 m).   Weight as of this encounter: 91 kg.       Discharge Diagnoses:  Principal Problem:   Toe necrosis (HCC) Active Problems:   HTN (hypertension)   Acute-on-chronic kidney injury (HCC)   CHF (congestive heart failure) (HCC)   Atrial fibrillation (HCC)   Adjustment disorder, unspecified    Discharge Instructions  Discharge Instructions     Ambulatory referral to Nutrition and Diabetic Education   Complete by: As directed  A1C 10.4% on 05/22/21      Allergies as of 05/29/2021   No Known Allergies      Medication List     TAKE these medications    acetaminophen 500 MG tablet Commonly known as: TYLENOL Take 1,000 mg by mouth  every 6 (six) hours as needed.   atorvastatin 10 MG tablet Commonly known as: LIPITOR Take 1 tablet (10 mg total) by mouth daily.   clopidogrel 75 MG tablet Commonly known as: PLAVIX Take 1 tablet (75 mg total) by mouth daily with breakfast.   clotrimazole 1 % cream Commonly known as: LOTRIMIN Apply 1 application topically 2 (two) times daily.   cyanocobalamin 1000 MCG tablet Take 1 tablet (1,000 mcg total) by mouth daily.   diclofenac Sodium 1 % Gel Commonly known as: VOLTAREN Apply 2 g topically 4 (four) times daily.   feeding supplement (GLUCERNA SHAKE) Liqd Take 237 mLs by mouth 2 (two) times daily between meals.   furosemide 20 MG tablet Commonly known as: Lasix Take 1 tablet (20 mg total) by mouth daily. What changed:  medication strength how much to take when to take this   hydrOXYzine 10 MG tablet Commonly known as: ATARAX/VISTARIL Take 1 tablet (10 mg total) by mouth 3 (three) times daily as needed for anxiety.   insulin glargine 100 UNIT/ML injection Commonly known as: LANTUS Inject 0.08 mLs (8 Units total) into the skin daily.   losartan 50 MG tablet Commonly known as: COZAAR Take 1 tablet (50 mg total) by mouth daily.   metoprolol succinate 100 MG 24 hr tablet Commonly known as: TOPROL-XL Take 1 tablet (100 mg total) by mouth 2 (two) times daily.   vitamin C 1000 MG tablet Take 1,000 mg by mouth daily.   Vitamin D (Ergocalciferol) 1.25 MG (50000 UNIT) Caps capsule Commonly known as: DRISDOL Take 1 capsule (50,000 Units total) by mouth every Thursday.   warfarin 6 MG tablet Commonly known as: COUMADIN Take as directed. If you are unsure how to take this medication, talk to your nurse or doctor. Original instructions: Take 1 tablet (6 mg total) by mouth See admin instructions. Takes 3 mg on Tuesday and Saturday and 6 mg all other days         Follow-up Information     Primary Care at [facility] Call.   Specialty: Family  Medicine Contact information: [address] [place] [address] [phone]               No Known Allergies  Consultations: Vascular.    Procedures/Studies: DG Knee 1-2 Views Right  Result Date: 05/21/2021 CLINICAL DATA:  Persistent right knee pain times multiple days EXAM: RIGHT KNEE - 1-2 VIEW COMPARISON:  None. FINDINGS: No evidence of fracture, dislocation, or joint effusion. Chondrocalcinosis. Tricompartment degenerative change worse in the medial tibiofemoral compartment where it is moderate. Vascular calcifications. IMPRESSION: 1. No acute osseous abnormality. 2. 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 Electronically Signed   By: Maudry Mayhew MD   On: 05/21/2021 17:20   PERIPHERAL VASCULAR CATHETERIZATION  Result Date: 05/19/2021 Formatting of this result is different from the original. Patient name: SAHMIR BASSETTE      MRN: [id number]        DOB: 1951-09-01        Sex: male   05/19/2021 Pre-operative Diagnosis: Critical limb ischemia left lower extremity with left fifth toe gangrene Post-operative diagnosis:  Same Surgeon:  Canary Brim.  Chestine Spore, MD Procedure Performed: 1.  Ultrasound-guided access of right common femoral artery 2.  Aortogram including catheter selection of aorta 3.  Bilateral lower extremity arteriogram with selection third order branches left lower extremity 4.  Left peroneal angioplasty (3 mm x 40 mm Sterling) 5.  Mynx closure of the right common femoral artery 6.  43 minutes of monitored moderate conscious sedation time   Indications: Patient is a 70 year old male who was seen in consultation over the weekend by Dr. Lenell Antu with left fifth toe gangrene and ischemic changes.  He had abnormal monophasic waveforms at the ankle.  He presents today for aortogram, left lower extremity arteriogram, and possible intervention after risk benefits discussed.   Findings: Aortogram showed  patent renal arteries bilaterally with no flow-limiting stenosis in the aortoiliac segment.  The left lower extremity which is the side of interest showed a patent common femoral, profunda, SFA, above and below-knee popliteal artery without flow-limiting stenosis.  He has severe tibial disease with only peroneal runoff.  There was a 50% stenosis in the proximal peroneal, an 80% focal stenosis in the mid peroneal, and a short subtotal occlusion of the distal peroneal.  Th peroneal did reconstitute a PT at the ankle.  The peroneal lesion was then crossed and all lesions were angioplastied with 3 mm Sterling with excellent results and no residual stenosis.   Right lower extremity also shows patent common femoral, profunda, SFA, popliteal with tibial disease and only single peroneal runoff but also has a high-grade stenosis in the distal vessel reconstituting the posterior tibial             Procedure:  The patient was identified in the holding area and taken to room 8.  The patient was then placed supine on the table and prepped and draped in the usual sterile fashion.  A time out was called.  Ultrasound was used to evaluate the right common femoral artery.  It was patent .  A digital ultrasound image was acquired.  A micropuncture needle was used to access the right common femoral artery under ultrasound guidance.  An 018 wire was advanced without resistance and a micropuncture sheath was placed.  The 018 wire was removed and a benson wire was placed.  The micropuncture sheath was exchanged for a 5 french sheath.  An omniflush catheter was advanced over the wire to the level of L-1.  An abdominal angiogram was obtained.  Next the catheter was pulled down and bilateral lower extremity runoff was obtained.  After evaluating images elected to intervene on the left peroneal artery.  I crossed the aortic bifurcation and placed my catheter into the left SFA where then placed a Rosen wire we exchanged for a long 5 French  Ansell sheath in the right groin over the aortic bifurcation.  Patient was given 100 units/kg IV heparin.  I then used a long straight 100 cm flush catheter to get better dedicated images of the left peroneal artery.  We identified 3 lesions as noted above.  Lesions were then all crossed with a V 18 wire.  I then angioplastied each individual lesion with a 3 mm x 40 mm Sterling to nominal pressure for 2 minutes each.  There was no residual stenosis or dissection with a widely patent vessel.  Satisfied the results wires and catheters were removed.  I exchanged for a short 5 French sheath in the right groin.  Mynx closure device was deployed.     Plan: Patient will be loaded  on Plavix.  He now has inline flow and is optimized.   Cephus Shelling, MD [facility] Office: [phone]  DG Toe 5th Left  Result Date: 05/15/2021 CLINICAL DATA:  Pinky toe pain for approx 1 month. Decay and blackening on toe. Maggots removed today in ER. Hx: diabetes, gangrene EXAM: DG TOE 5TH LEFT COMPARISON:  None. FINDINGS: Cortical irregularity and mild periosteal reaction of the proximal fifth phalanx. No definite cortical erosion or destruction. Question may be nondisplaced fracture. No definite acute displaced fracture or dislocation. Associated superimpose subcutaneus soft tissue edema and emphysema. IMPRESSION: 1. Cortical irregularity of the proximal fifth phalanx. No definite cortical erosion or destruction. Associated superimposed subcutaneus soft tissue edema and emphysema. Consider MRI foot with intravenous contrast (if GFR greater than 30) for further evaluation of possible osteomyelitis. 2. Question maybe nondisplaced fracture. Electronically Signed   By: Tish Frederickson M.D.   On: 05/15/2021 23:19   VAS [place] ABI WITH/WO TBI  Result Date: 05/17/2021  LOWER EXTREMITY DOPPLER STUDY Patient Name:  KNIGHT OELKERS  Date of Exam:   05/16/2021 Medical Rec #: [id number]          Accession #:     [id number] Date of Birth: 1951/10/09         Patient Gender: M Patient Age:   69Y Exam [facility]  [facility] Procedure:      VAS [place] ABI WITH/WO TBI Referring Phys: [id number] MARCUS V DUDA --------------------------------------------------------------------------------  Indications: Left fifth toe dry gangrene. High Risk Factors: Hypertension, Diabetes, current smoker.  Comparison Study: No prior study Performing Technologist: Gertie Fey MHA, RVT, RDCS, RDMS  Examination Guidelines: A complete evaluation includes at minimum, Doppler waveform signals and systolic blood pressure reading at the level of bilateral brachial, anterior tibial, and posterior tibial arteries, when vessel segments are accessible. Bilateral testing is considered an integral part of a complete examination. Photoelectric Plethysmograph (PPG) waveforms and toe systolic pressure readings are included as required and additional duplex testing as needed. Limited examinations for reoccurring indications may be performed as noted.  ABI Findings: +--------+------------------+-----+----------+--------+ Right   Rt Pressure (mmHg)IndexWaveform  Comment  +--------+------------------+-----+----------+--------+ [id number]                    triphasic          +--------+------------------+-----+----------+--------+ PTA     139               1.11 monophasic         +--------+------------------+-----+----------+--------+ DP      118               0.94 monophasic         +--------+------------------+-----+----------+--------+ +--------+------------------+-----+----------+-------+ Left    Lt Pressure (mmHg)IndexWaveform  Comment +--------+------------------+-----+----------+-------+ [id number]                    triphasic         +--------+------------------+-----+----------+-------+ PTA     100               0.80 monophasic        +--------+------------------+-----+----------+-------+ DP      84                 0.67 monophasic        +--------+------------------+-----+----------+-------+ +-------+-----------+-----------+------------+------------+ ABI/TBIToday's ABIToday's TBIPrevious ABIPrevious TBI +-------+-----------+-----------+------------+------------+ Right  1.11       0.24                                +-------+-----------+-----------+------------+------------+  Left   0.80       0.21                                +-------+-----------+-----------+------------+------------+  Summary: Right: Resting right ankle-brachial index is within normal range, however given abnormal waveform this is likely falsely elevated due to medial arterial calcification. The right toe-brachial index is abnormal. Left: Resting left ankle-brachial index indicates mild left lower extremity arterial disease. The left toe-brachial index is abnormal.  *See table(s) above for measurements and observations.  Electronically signed by Heath Lark on 05/17/2021 at 4:30:05 PM.    Final    DG HIP UNILAT WITH PELVIS 2-3 VIEWS RIGHT  Result Date: 05/23/2021 CLINICAL DATA:  Chronic right hip pain EXAM: DG HIP (WITH OR WITHOUT PELVIS) 2-3V RIGHT COMPARISON:  None. FINDINGS: Advanced degenerative change right hip with joint space narrowing, bony sclerosis, and cystic change in the femoral head and adjacent acetabulum. 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 Negative for fracture Negative left hip.  No pelvic bone lesion identified. 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 Electronically Signed   By: Marlan Palau M.D.   On: 05/23/2021 12:49     Subjective: He is eating more. Report chronic dizziness.   Discharge Exam: Vitals:   05/29/21 0409 05/29/21 0752  BP: (!) 152/90 (!) 165/97  Pulse: 82   Resp: 16 12  Temp: 98 F (36.7 C) 97.8 F (36.6 C)  SpO2: 93%      General: Pt is alert, awake, not in acute distress Cardiovascular: RRR, S1/S2 +, no rubs, no  gallops Respiratory: CTA bilaterally, no wheezing, no rhonchi Abdominal: Soft, NT, ND, bowel sounds + Extremities: no edema, no cyanosis, dressing prior left fifth toe site amputation.     The results of significant diagnostics from this hospitalization (including imaging, microbiology, ancillary and laboratory) are listed below for reference.     Microbiology: Recent Results (from the past 240 hour(s))  Resp Panel by RT-PCR (Flu A&B, Covid) Nasopharyngeal Swab     Status: None   Collection Time: 05/27/21 11:09 PM   Specimen: Nasopharyngeal Swab; Nasopharyngeal(NP) swabs in vial transport medium  Result Value Ref Range Status   SARS Coronavirus 2 by RT PCR NEGATIVE NEGATIVE Final    Comment: (NOTE) SARS-CoV-2 target nucleic acids are NOT DETECTED.  The SARS-CoV-2 RNA is generally detectable in upper respiratory specimens during the acute phase of infection. The lowest concentration of SARS-CoV-2 viral copies this assay can detect is 138 copies/mL. A negative result does not preclude SARS-Cov-2 infection and should not be used as the sole basis for treatment or other patient management decisions. A negative result may occur with  improper specimen collection/handling, submission of specimen other than nasopharyngeal swab, presence of viral mutation(s) within the areas targeted by this assay, and inadequate number of viral copies(<138 copies/mL). A negative result must be combined with clinical observations, patient history, and epidemiological information. The expected result is Negative.  Fact Sheet for Patients:  BloggerCourse.com  Fact Sheet for Healthcare Providers:  SeriousBroker.it  This test is no t yet approved or cleared by the [place] FDA and  has been authorized for detection and/or diagnosis of SARS-CoV-2 by FDA under an Emergency Use Authorization (EUA). This EUA will remain  in effect (meaning this test can  be used) for the duration of the COVID-19 declaration under Section 564(b)(1) of the Act, 21 U.S.C.section  360bbb-3(b)(1), unless the authorization is terminated  or revoked sooner.       Influenza A by PCR NEGATIVE NEGATIVE Final   Influenza B by PCR NEGATIVE NEGATIVE Final    Comment: (NOTE) The Xpert Xpress SARS-CoV-2/FLU/RSV plus assay is intended as an aid in the diagnosis of influenza from Nasopharyngeal swab specimens and should not be used as a sole basis for treatment. Nasal washings and aspirates are unacceptable for Xpert Xpress SARS-CoV-2/FLU/RSV testing.  Fact Sheet for Patients: BloggerCourse.comhttps://www.fda.gov/media/152166/download  Fact Sheet for Healthcare Providers: SeriousBroker.ithttps://www.fda.gov/media/152162/download  This test is not yet approved or cleared by the [place] FDA and has been authorized for detection and/or diagnosis of SARS-CoV-2 by FDA under an Emergency Use Authorization (EUA). This EUA will remain in effect (meaning this test can be used) for the duration of the COVID-19 declaration under Section 564(b)(1) of the Act, 21 U.S.C. section 360bbb-3(b)(1), unless the authorization is terminated or revoked.  Performed at [facility], [address] [place], [place] [id number]      Labs: BNP (last 3 results) Recent Labs    07/16/20 1044  BNP 184.8*   Basic Metabolic Panel: Recent Labs  Lab 05/24/21 0029 05/25/21 0025 05/25/21 0726 05/26/21 0606 05/27/21 0053 05/27/21 1357 05/28/21 0636 05/29/21 0034  NA 136  --  136 137  --  137 134*  --   K 3.2*  --  4.4 4.7  --  5.2* 4.6  --   CL 99  --  99 104  --  99 101  --   CO2 33*  --  30 28  --  31 26  --   GLUCOSE 185*  --  136* 165*  --  178* 190*  --   BUN 12  --  28* 13  --  18 18  --   CREATININE 1.28*   < > 1.45* 1.14 1.21 1.17 1.00 1.17  CALCIUM 8.5*  --  8.2* 8.8*  --  9.3 9.0  --    < > = values in this interval not displayed.   Liver Function Tests: No results for input(s): AST,  ALT, ALKPHOS, BILITOT, PROT, ALBUMIN in the last 168 hours. No results for input(s): LIPASE, AMYLASE in the last 168 hours. No results for input(s): AMMONIA in the last 168 hours. CBC: Recent Labs  Lab 05/24/21 0029 05/25/21 0025 05/26/21 0606 05/27/21 0053 05/28/21 0636  WBC 8.9 8.5 7.7 9.2 7.8  HGB 11.7* 12.2* 12.5* 11.7* 12.2*  HCT 35.3* 37.2* 38.0* 36.4* 36.9*  MCV 86.9 88.2 89.4 90.1 88.3  PLT 213 233 228 254 246   Cardiac Enzymes: No results for input(s): CKTOTAL, CKMB, CKMBINDEX, TROPONINI in the last 168 hours. BNP: Invalid input(s): POCBNP CBG: Recent Labs  Lab 05/28/21 0616 05/28/21 1117 05/28/21 1546 05/28/21 2128 05/29/21 0617  GLUCAP 188* 162* 198* 137* 163*   D-Dimer No results for input(s): DDIMER in the last 72 hours. Hgb A1c No results for input(s): HGBA1C in the last 72 hours. Lipid Profile No results for input(s): CHOL, HDL, LDLCALC, TRIG, CHOLHDL, LDLDIRECT in the last 72 hours. Thyroid function studies No results for input(s): TSH, T4TOTAL, T3FREE, THYROIDAB in the last 72 hours.  Invalid input(s): FREET3 Anemia work up No results for input(s): VITAMINB12, FOLATE, FERRITIN, TIBC, IRON, RETICCTPCT in the last 72 hours. Urinalysis    Component Value Date/Time   COLORURINE YELLOW 05/01/2016 0519   APPEARANCEUR CLEAR 05/01/2016 0519   LABSPEC 1.019 05/01/2016 0519   PHURINE 7.5 05/01/2016  0519   GLUCOSEU NEGATIVE 05/01/2016 0519   HGBUR NEGATIVE 05/01/2016 0519   BILIRUBINUR NEGATIVE 05/01/2016 0519   KETONESUR NEGATIVE 05/01/2016 0519   PROTEINUR NEGATIVE 05/01/2016 0519   UROBILINOGEN 0.2 05/09/2014 0138   NITRITE NEGATIVE 05/01/2016 0519   LEUKOCYTESUR NEGATIVE 05/01/2016 0519   Sepsis Labs Invalid input(s): PROCALCITONIN,  WBC,  LACTICIDVEN Microbiology Recent Results (from the past 240 hour(s))  Resp Panel by RT-PCR (Flu A&B, Covid) Nasopharyngeal Swab     Status: None   Collection Time: 05/27/21 11:09 PM   Specimen:  Nasopharyngeal Swab; Nasopharyngeal(NP) swabs in vial transport medium  Result Value Ref Range Status   SARS Coronavirus 2 by RT PCR NEGATIVE NEGATIVE Final    Comment: (NOTE) SARS-CoV-2 target nucleic acids are NOT DETECTED.  The SARS-CoV-2 RNA is generally detectable in upper respiratory specimens during the acute phase of infection. The lowest concentration of SARS-CoV-2 viral copies this assay can detect is 138 copies/mL. A negative result does not preclude SARS-Cov-2 infection and should not be used as the sole basis for treatment or other patient management decisions. A negative result may occur with  improper specimen collection/handling, submission of specimen other than nasopharyngeal swab, presence of viral mutation(s) within the areas targeted by this assay, and inadequate number of viral copies(<138 copies/mL). A negative result must be combined with clinical observations, patient history, and epidemiological information. The expected result is Negative.  Fact Sheet for Patients:  BloggerCourse.com  Fact Sheet for Healthcare Providers:  SeriousBroker.it  This test is no t yet approved or cleared by the [place] FDA and  has been authorized for detection and/or diagnosis of SARS-CoV-2 by FDA under an Emergency Use Authorization (EUA). This EUA will remain  in effect (meaning this test can be used) for the duration of the COVID-19 declaration under Section 564(b)(1) of the Act, 21 U.S.C.section 360bbb-3(b)(1), unless the authorization is terminated  or revoked sooner.       Influenza A by PCR NEGATIVE NEGATIVE Final   Influenza B by PCR NEGATIVE NEGATIVE Final    Comment: (NOTE) The Xpert Xpress SARS-CoV-2/FLU/RSV plus assay is intended as an aid in the diagnosis of influenza from Nasopharyngeal swab specimens and should not be used as a sole basis for treatment. Nasal washings and aspirates are unacceptable for  Xpert Xpress SARS-CoV-2/FLU/RSV testing.  Fact Sheet for Patients: BloggerCourse.com  Fact Sheet for Healthcare Providers: SeriousBroker.it  This test is not yet approved or cleared by the [place] FDA and has been authorized for detection and/or diagnosis of SARS-CoV-2 by FDA under an Emergency Use Authorization (EUA). This EUA will remain in effect (meaning this test can be used) for the duration of the COVID-19 declaration under Section 564(b)(1) of the Act, 21 U.S.C. section 360bbb-3(b)(1), unless the authorization is terminated or revoked.  Performed at [facility], [address], [place], [place] [address]      Time coordinating discharge: 40 minutes  SIGNED:   Alba Cory, MD  [facility]

## 2021-05-27 NOTE — TOC Progression Note (Addendum)
Transition of Care [facility]) - Progression Note    Patient Details  Name: [name] MRN: [id number] Date of Birth: Nov 25, 1950  Transition of Care [facility]) CM/SW Contact  Ivette Loyal, [place] Phone Number: 05/27/2021, 1:44 PM  Clinical Narrative:    1035: CSW contacted pt daughter with no answer the followed up with pt on his cell to confirm placement location. Pt wants his daughter to look over offers to make a decision. CSW shared that his daughter has not answered or called back over the past few days and asked pt to contact CSW with information provided. Pt agreed.  1331: Pt daughter contacted CSW and stated pt would like to go to [facility] for SNF. Pt daughter also asked for CSW to fill out Medicaid for, CSW informed pt daughter that CSW could provide her resources to fill out paperwork but could not fill out his paperwork for him. CSW contacted Kitty at [facility] for bed availability, Georgeann Oppenheim will contact CSW with bed offer after reviewing pt information.   Expected Discharge Plan: Skilled Nursing Facility Barriers to Discharge: English as a second language teacher  Expected Discharge Plan and Services Expected Discharge Plan: Skilled Nursing Facility In-house Referral: Clinical Social Work Discharge Planning Services: CM Consult Post Acute Care Choice: Skilled Nursing Facility Living arrangements for the past 2 months: Single Family Home                                       Social Determinants of Health (SDOH) Interventions    Readmission Risk Interventions No flowsheet data found.

## 2021-05-27 NOTE — Plan of Care (Signed)
  Problem: Fluid Volume: Goal: Ability to maintain a balanced intake and output will improve Outcome: Progressing   Problem: Health Behavior/Discharge Planning: Goal: Ability to manage health-related needs will improve Outcome: Progressing   Problem: Nutritional: Goal: Maintenance of adequate nutrition will improve Outcome: Progressing   Problem: Skin Integrity: Goal: Risk for impaired skin integrity will decrease Outcome: Progressing   Problem: Tissue Perfusion: Goal: Adequacy of tissue perfusion will improve Outcome: Progressing   Problem: Education: Goal: Knowledge of General Education information will improve Description: Including pain rating scale, medication(s)/side effects and non-pharmacologic comfort measures Outcome: Progressing   Problem: Nutrition: Goal: Adequate nutrition will be maintained Outcome: Progressing

## 2021-05-27 NOTE — Progress Notes (Signed)
Occupational Therapy Treatment Patient Details Name: [name] MRN: [id number] DOB: 04-16-1951 Today's Date: 05/27/2021    History of present illness Pt adm 7/7 with necrotic lt fifth toe with maggots. Underwent arteriogram 7/10 s/p multiple lesions angioplastied. 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 PMH - chf, htn, ckd,dm.   OT comments  Pt progressing slowly towards acute OT goals. Remains to c/o "feeling funny (points to head)" once in standing position, improves a bit but does not seem to resolve with sitting. BP once in recliner: 158/114. BP five minutes later: 141/128. Up in recliner at end of session. D/c plan remains appropriate.    Follow Up Recommendations  SNF    Equipment Recommendations  Other (comment)    Recommendations for Other Services      Precautions / Restrictions Precautions Precautions: Fall;Other (comment) Precaution Comments: Very poor vision. Able to distinguish light/dark. Restrictions Weight Bearing Restrictions: Yes Other Position/Activity Restrictions: L Darco shoe       Mobility Bed Mobility Overal bed mobility: Needs Assistance Bed Mobility: Supine to Sit     Supine to sit: Min assist;HOB elevated     General bed mobility comments: Light min A during trunk elevation with pt reaching for therapist arm. Less assist than last session. Pt utilizing momentum to powerup trunk.    Transfers Overall transfer level: Needs assistance Equipment used: Rolling walker (2 wheeled) Transfers: Sit to/from Stand Sit to Stand: From elevated surface;Min assist         General transfer comment: sequencing cues. elevated seat height. min A to power up and steady. Balance impacted by height difference in BLE with L darco shoe.    Balance Overall balance assessment: Needs assistance Sitting-balance support: Bilateral upper extremity supported;Feet supported Sitting balance-Leahy Scale: Fair     Standing balance support: Bilateral upper  extremity supported Standing balance-Leahy Scale: Poor Standing balance comment: walker and min assist for static standing. Uneven BLE lengths with Darco shoe on L foot.                           ADL either performed or assessed with clinical judgement   ADL Overall ADL's : Needs assistance/impaired                         Toilet Transfer: Minimal assistance;Cueing for safety;Cueing for sequencing;Stand-pivot;BSC;RW Toilet Transfer Details (indicate cue type and reason): simulated with EOB to recliner. Discussed pt using BSC for toileting when feasible as he has apparently still been using a bed pan or urinal.           General ADL Comments: Bed mobility, sat EOB several minutes, then took pivotal steps to sit up in recliner     Vision   Additional Comments: low vision   Perception     Praxis      Cognition Arousal/Alertness: Awake/alert Behavior During Therapy: Flat affect Overall Cognitive Status: No family/caregiver present to determine baseline cognitive functioning                                 General Comments: Tangential. Delayed responses. Emergent awareness. Low mood.        Exercises     Shoulder Instructions       General Comments BP once in recliner: 158/114. BP five minutes later: 141/128.    Pertinent Vitals/ Pain  Pain Assessment: Faces Faces Pain Scale: Hurts a little bit Pain Location: L foot, R knee Pain Descriptors / Indicators: Guarding;Grimacing;Discomfort Pain Intervention(s): Monitored during session;Limited activity within patient's tolerance;Repositioned  Home Living                                          Prior Functioning/Environment              Frequency  Min 2X/week        Progress Toward Goals  OT Goals(current goals can now be found in the care plan section)  Progress towards OT goals: Progressing toward goals  Acute Rehab OT Goals Patient Stated  Goal: Did not state OT Goal Formulation: With patient Time For Goal Achievement: 06/03/21 Potential to Achieve Goals: Fair ADL Goals Pt Will Perform Grooming: with min guard assist;standing Pt Will Perform Lower Body Dressing: with min assist;sitting/lateral leans;sit to/from stand Pt Will Transfer to Toilet: with min assist;ambulating;bedside commode Pt/caregiver will Perform Home Exercise Program: Both right and left upper extremity;With Supervision Additional ADL Goal #1: Pt will require <3 cues for redirection of tangential topics in order to attend to functional tasks throughout session. Additional ADL Goal #2: Pt will increase to x3 mins of standing tolerance with minguardA overall for stability with least restrictive assistive device.  Plan Discharge plan remains appropriate    Co-evaluation                 AM-PAC OT "6 Clicks" Daily Activity     Outcome Measure   Help from another person eating meals?: None Help from another person taking care of personal grooming?: A Little Help from another person toileting, which includes using toliet, bedpan, or urinal?: A Lot Help from another person bathing (including washing, rinsing, drying)?: A Lot Help from another person to put on and taking off regular upper body clothing?: A Little Help from another person to put on and taking off regular lower body clothing?: A Lot 6 Click Score: 16    End of Session Equipment Utilized During Treatment: Rolling walker;Other (comment) (L Darco shoe)  OT Visit Diagnosis: Unsteadiness on feet (R26.81);Muscle weakness (generalized) (M62.81)   Activity Tolerance Patient tolerated treatment well;Other (comment) ("feel funny" with sit>stand, remains for a bit even once seated.)   Patient Left in chair;with call bell/phone within reach;Other (comment) (no chair alarm in room. Notified NT.)   Nurse Communication Other (comment) (Pt with questions about BP medication)        Time:  0940-1030 OT Time Calculation (min): 50 min  Charges: OT General Charges $OT Visit: 1 Visit OT Treatments $Self Care/Home Management : 23-37 mins $Therapeutic Activity: 8-22 mins  Raynald Kemp, OT Acute Rehabilitation Services Pager: [phone] Office: [phone]    Pilar Grammes 05/27/2021, 10:58 AM

## 2021-05-28 LAB — CBC
HCT: 36.9 % — ABNORMAL LOW (ref 39.0–52.0)
Hemoglobin: 12.2 g/dL — ABNORMAL LOW (ref 13.0–17.0)
MCH: 29.2 pg (ref 26.0–34.0)
MCHC: 33.1 g/dL (ref 30.0–36.0)
MCV: 88.3 fL (ref 80.0–100.0)
Platelets: 246 10*3/uL (ref 150–400)
RBC: 4.18 MIL/uL — ABNORMAL LOW (ref 4.22–5.81)
RDW: 14.1 % (ref 11.5–15.5)
WBC: 7.8 10*3/uL (ref 4.0–10.5)
nRBC: 0 % (ref 0.0–0.2)

## 2021-05-28 LAB — BASIC METABOLIC PANEL
Anion gap: 7 (ref 5–15)
BUN: 18 mg/dL (ref 8–23)
CO2: 26 mmol/L (ref 22–32)
Calcium: 9 mg/dL (ref 8.9–10.3)
Chloride: 101 mmol/L (ref 98–111)
Creatinine, Ser: 1 mg/dL (ref 0.61–1.24)
GFR, Estimated: >60 mL/min (ref >60)
Glucose, Bld: 190 mg/dL — ABNORMAL HIGH (ref 70–99)
Potassium: 4.6 mmol/L (ref 3.5–5.1)
Sodium: 134 mmol/L — ABNORMAL LOW (ref 135–145)

## 2021-05-28 LAB — RESP PANEL BY RT-PCR (FLU A&B, COVID) ARPGX2
Influenza A by PCR: NEGATIVE
Influenza B by PCR: NEGATIVE
SARS Coronavirus 2 by RT PCR: NEGATIVE

## 2021-05-28 LAB — GLUCOSE, CAPILLARY
Glucose-Capillary: 188 mg/dL — ABNORMAL HIGH (ref 70–99)
Glucose-Capillary: 162 mg/dL — ABNORMAL HIGH (ref 70–99)
Glucose-Capillary: 198 mg/dL — ABNORMAL HIGH (ref 70–99)
Glucose-Capillary: 137 mg/dL — ABNORMAL HIGH (ref 70–99)

## 2021-05-28 LAB — PROTIME-INR
INR: 2.1 — ABNORMAL HIGH (ref 0.8–1.2)
Prothrombin Time: 23.8 seconds — ABNORMAL HIGH (ref 11.4–15.2)

## 2021-05-28 MED ORDER — WARFARIN SODIUM 3 MG PO TABS
6.0000 mg | ORAL_TABLET | Freq: Once | ORAL | Status: AC
  Administered 2021-05-28: 6 mg via ORAL
  Filled 2021-05-28: qty 2

## 2021-05-28 NOTE — Progress Notes (Signed)
ANTICOAGULATION CONSULT NOTE - Follow Up Consult  Pharmacy Consult for warfarin Indication: atrial fibrillation and Pta LV thrombus  No Known Allergies  Patient Measurements: Height: 5\' 8"  (172.7 cm) Weight: 91 kg (200 lb 9.9 oz) IBW/kg (Calculated) : 68.4 Heparin Dosing Weight: 87 kg  Vital Signs: Temp: 97.7 F (36.5 C) (07/20 0735) Temp Source: Oral (07/20 0735) BP: 162/96 (07/20 0735) Pulse Rate: 85 (07/20 0735)  Labs: Recent Labs    05/26/21 0606 05/27/21 0053 05/27/21 1357 05/28/21 0636  HGB 12.5* 11.7*  --  12.2*  HCT 38.0* 36.4*  --  36.9*  PLT 228 254  --  246  LABPROT 20.2* 22.7*  --  23.8*  INR 1.7* 2.0*  --  2.1*  CREATININE 1.14 1.21 1.17 1.00     Estimated Creatinine Clearance: 75.3 mL/min (by C-G formula based on SCr of 1 mg/dL).   Medications:  Infusions:   sodium chloride      Assessment: 70 yo m with afib and LV thrombus (PTA coumadin) bridged with heparin for toe amputation 7/14. Pharmacy asked to dose enoxaparin and warfarin.   INR remains therapeutic at 2.1 today, enoxaparin stopped 7/19. Discharge to SNF pending.  *Home dose 3mg  Tues/Sat, 6mg  AODs  Goal of Therapy:  INR 2-3 Monitor platelets by anticoagulation protocol: Yes   Plan:  Warfarin 6mg  x1 tonight Daily INR   8/19, PharmD, BCPS, [facility] Clinical Pharmacist [phone] Please check AMION for all [facility] numbers 05/28/2021

## 2021-05-28 NOTE — Progress Notes (Signed)
Patient seen and examined this morning, d/c by Dr Sunnie Nielsen yesterday, stable for dc.  Summary updated.  Seng Fouts M. Elvera Lennox, MD, PhD [facility]  Between 7 am - 7 pm you can contact me via Amion (for emergencies) or Securechat (non urgent matters).  I am not available 7 pm - 7 am, please contact night coverage MD/APP via Amion

## 2021-05-28 NOTE — Progress Notes (Signed)
Patient refused afternoon vital signs.

## 2021-05-28 NOTE — TOC Progression Note (Signed)
Transition of Care [facility]) - Progression Note    Patient Details  Name: [name] MRN: [id number] Date of Birth: [birth year]  Transition of Care [facility]) CM/SW Contact  Ivette Loyal, [place] Phone Number: 05/28/2021, 1:31 PM  Clinical Narrative:    Georgeann Oppenheim at [facility] is able to accept pt for tomorrow, Georgeann Oppenheim will also start auth for pt bc he is not managed by [employer].   Expected Discharge Plan: Skilled Nursing Facility Barriers to Discharge: Insurance Authorization  Expected Discharge Plan and Services Expected Discharge Plan: Skilled Nursing Facility In-house Referral: Clinical Social Work Discharge Planning Services: CM Consult Post Acute Care Choice: Skilled Nursing Facility Living arrangements for the past 2 months: Single Family Home Expected Discharge Date: 05/28/21                                     Social Determinants of Health (SDOH) Interventions    Readmission Risk Interventions No flowsheet data found.

## 2021-05-29 DIAGNOSIS — Z7401 Bed confinement status: Secondary | ICD-10-CM | POA: Diagnosis not present

## 2021-05-29 DIAGNOSIS — E1165 Type 2 diabetes mellitus with hyperglycemia: Secondary | ICD-10-CM | POA: Diagnosis not present

## 2021-05-29 DIAGNOSIS — H34811 Central retinal vein occlusion, right eye, with macular edema: Secondary | ICD-10-CM | POA: Diagnosis not present

## 2021-05-29 DIAGNOSIS — I130 Hypertensive heart and chronic kidney disease with heart failure and stage 1 through stage 4 chronic kidney disease, or unspecified chronic kidney disease: Secondary | ICD-10-CM | POA: Diagnosis not present

## 2021-05-29 DIAGNOSIS — I70249 Atherosclerosis of native arteries of left leg with ulceration of unspecified site: Secondary | ICD-10-CM | POA: Diagnosis not present

## 2021-05-29 DIAGNOSIS — I5032 Chronic diastolic (congestive) heart failure: Secondary | ICD-10-CM | POA: Diagnosis not present

## 2021-05-29 DIAGNOSIS — R4189 Other symptoms and signs involving cognitive functions and awareness: Secondary | ICD-10-CM | POA: Diagnosis not present

## 2021-05-29 DIAGNOSIS — I513 Intracardiac thrombosis, not elsewhere classified: Secondary | ICD-10-CM | POA: Diagnosis not present

## 2021-05-29 DIAGNOSIS — I5042 Chronic combined systolic (congestive) and diastolic (congestive) heart failure: Secondary | ICD-10-CM | POA: Diagnosis not present

## 2021-05-29 DIAGNOSIS — Z741 Need for assistance with personal care: Secondary | ICD-10-CM | POA: Diagnosis not present

## 2021-05-29 DIAGNOSIS — N1832 Chronic kidney disease, stage 3b: Secondary | ICD-10-CM | POA: Diagnosis not present

## 2021-05-29 DIAGNOSIS — F4325 Adjustment disorder with mixed disturbance of emotions and conduct: Secondary | ICD-10-CM | POA: Diagnosis not present

## 2021-05-29 DIAGNOSIS — N183 Chronic kidney disease, stage 3 unspecified: Secondary | ICD-10-CM | POA: Diagnosis not present

## 2021-05-29 DIAGNOSIS — R29818 Other symptoms and signs involving the nervous system: Secondary | ICD-10-CM | POA: Diagnosis not present

## 2021-05-29 DIAGNOSIS — F321 Major depressive disorder, single episode, moderate: Secondary | ICD-10-CM | POA: Diagnosis not present

## 2021-05-29 DIAGNOSIS — L602 Onychogryphosis: Secondary | ICD-10-CM | POA: Diagnosis not present

## 2021-05-29 DIAGNOSIS — M6281 Muscle weakness (generalized): Secondary | ICD-10-CM | POA: Diagnosis not present

## 2021-05-29 DIAGNOSIS — R531 Weakness: Secondary | ICD-10-CM | POA: Diagnosis not present

## 2021-05-29 DIAGNOSIS — H2523 Age-related cataract, morgagnian type, bilateral: Secondary | ICD-10-CM | POA: Diagnosis not present

## 2021-05-29 DIAGNOSIS — M25561 Pain in right knee: Secondary | ICD-10-CM | POA: Diagnosis not present

## 2021-05-29 DIAGNOSIS — Z4781 Encounter for orthopedic aftercare following surgical amputation: Secondary | ICD-10-CM | POA: Diagnosis not present

## 2021-05-29 DIAGNOSIS — R768 Other specified abnormal immunological findings in serum: Secondary | ICD-10-CM | POA: Diagnosis not present

## 2021-05-29 DIAGNOSIS — F419 Anxiety disorder, unspecified: Secondary | ICD-10-CM | POA: Diagnosis not present

## 2021-05-29 DIAGNOSIS — I739 Peripheral vascular disease, unspecified: Secondary | ICD-10-CM | POA: Diagnosis not present

## 2021-05-29 DIAGNOSIS — I96 Gangrene, not elsewhere classified: Secondary | ICD-10-CM | POA: Diagnosis not present

## 2021-05-29 DIAGNOSIS — R279 Unspecified lack of coordination: Secondary | ICD-10-CM | POA: Diagnosis not present

## 2021-05-29 DIAGNOSIS — R0902 Hypoxemia: Secondary | ICD-10-CM | POA: Diagnosis not present

## 2021-05-29 DIAGNOSIS — R41841 Cognitive communication deficit: Secondary | ICD-10-CM | POA: Diagnosis not present

## 2021-05-29 DIAGNOSIS — R2681 Unsteadiness on feet: Secondary | ICD-10-CM | POA: Diagnosis not present

## 2021-05-29 DIAGNOSIS — M25551 Pain in right hip: Secondary | ICD-10-CM | POA: Diagnosis not present

## 2021-05-29 DIAGNOSIS — J069 Acute upper respiratory infection, unspecified: Secondary | ICD-10-CM | POA: Diagnosis not present

## 2021-05-29 DIAGNOSIS — R262 Difficulty in walking, not elsewhere classified: Secondary | ICD-10-CM | POA: Diagnosis not present

## 2021-05-29 DIAGNOSIS — I482 Chronic atrial fibrillation, unspecified: Secondary | ICD-10-CM | POA: Diagnosis not present

## 2021-05-29 DIAGNOSIS — I10 Essential (primary) hypertension: Secondary | ICD-10-CM | POA: Diagnosis not present

## 2021-05-29 DIAGNOSIS — E1129 Type 2 diabetes mellitus with other diabetic kidney complication: Secondary | ICD-10-CM | POA: Diagnosis not present

## 2021-05-29 DIAGNOSIS — I480 Paroxysmal atrial fibrillation: Secondary | ICD-10-CM | POA: Diagnosis not present

## 2021-05-29 DIAGNOSIS — Z1159 Encounter for screening for other viral diseases: Secondary | ICD-10-CM | POA: Diagnosis not present

## 2021-05-29 LAB — GLUCOSE, CAPILLARY
Glucose-Capillary: 163 mg/dL — ABNORMAL HIGH (ref 70–99)
Glucose-Capillary: 197 mg/dL — ABNORMAL HIGH (ref 70–99)

## 2021-05-29 LAB — CREATININE, SERUM
Creatinine, Ser: 1.17 mg/dL (ref 0.61–1.24)
GFR, Estimated: >60 mL/min (ref >60)

## 2021-05-29 LAB — PROTIME-INR
INR: 2.2 — ABNORMAL HIGH (ref 0.8–1.2)
Prothrombin Time: 24 seconds — ABNORMAL HIGH (ref 11.4–15.2)

## 2021-05-29 MED ORDER — WARFARIN SODIUM 3 MG PO TABS
6.0000 mg | ORAL_TABLET | Freq: Once | ORAL | Status: AC
  Administered 2021-05-29: 6 mg via ORAL
  Filled 2021-05-29: qty 2

## 2021-05-29 NOTE — Progress Notes (Signed)
Physical Therapy Treatment Patient Details Name: [name] MRN: [id number] DOB: 13-Nov-1950 Today's Date: 05/29/2021    History of Present Illness Pt adm 7/7 with necrotic lt fifth toe with maggots. Underwent arteriogram 7/10 s/p multiple lesions angioplastied. 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 PMH - chf, htn, ckd,dm.    PT Comments    Pt needed encouragement, but agreed to participation before d/c to [facility] this afternoon.  Slower to progress toward goals, likely limited by imposed unstable surface of the [facility] shoe, but also from neuropathies and low vision.   Emphasis on training transitions to EOB, sit to stand and progression of gait stability in the RW.    Follow Up Recommendations  SNF     Equipment Recommendations  Rolling walker with 5" wheels    Recommendations for Other Services       Precautions / Restrictions Precautions Precautions: Fall Precaution Comments: poor vision, did not run into stationary objects in the hall. Restrictions Other Position/Activity Restrictions: L Darco shoe    Mobility  Bed Mobility Overal bed mobility: Needs Assistance Bed Mobility: Supine to Sit     Supine to sit: Mod assist     General bed mobility comments: from flat HOB, mod up/forward onto R elbow, stability assist until able to use UE's    Transfers Overall transfer level: Needs assistance Equipment used: Rolling walker (2 wheeled) Transfers: Sit to/from Stand Sit to Stand: From elevated surface;Min assist         General transfer comment: assist needed to come forward more than boost.  Pt having some trouble controlling squat down to a lower seating surface without stability assist.,  Ambulation/Gait Ambulation/Gait assistance: Min assist Gait Distance (Feet): 60 Feet Assistive device: Rolling walker (2 wheeled) Gait Pattern/deviations: Step-through pattern;Step-to pattern Gait velocity: decr Gait velocity interpretation: <1.8 ft/sec,  indicate of risk for recurrent falls General Gait Details: generally steady, not stepping through so much as to cause w/bearing through toes in [facility].   Stairs             Wheelchair Mobility    Modified Rankin (Stroke Patients Only)       Balance Overall balance assessment: Needs assistance Sitting-balance support: No upper extremity supported;Feet supported Sitting balance-Leahy Scale: Fair Sitting balance - Comments: Support with LUE. Pt has difficulty/pain knee flexion in RLE.   Standing balance support: Bilateral upper extremity supported Standing balance-Leahy Scale: Poor Standing balance comment: reliant on AD and/or external support in different situations.                            Cognition Arousal/Alertness: Awake/alert Behavior During Therapy: Flat affect Overall Cognitive Status: No family/caregiver present to determine baseline cognitive functioning                                        Exercises      General Comments General comments (skin integrity, edema, etc.): vss overall on RA      Pertinent Vitals/Pain Pain Assessment: Faces Faces Pain Scale: Hurts a little bit Pain Location: L foot, R knee Pain Descriptors / Indicators: Guarding;Grimacing;Discomfort Pain Intervention(s): Monitored during session    Home Living                      Prior Function  PT Goals (current goals can now be found in the care plan section) Acute Rehab PT Goals Patient Stated Goal: Did not state PT Goal Formulation: With patient Time For Goal Achievement: 06/03/21 Potential to Achieve Goals: Fair Progress towards PT goals: Progressing toward goals    Frequency    Min 3X/week      PT Plan Current plan remains appropriate    Co-evaluation              AM-PAC PT "6 Clicks" Mobility   Outcome Measure  Help needed turning from your back to your side while in a flat bed without using bedrails?: A  Little Help needed moving from lying on your back to sitting on the side of a flat bed without using bedrails?: A Little Help needed moving to and from a bed to a chair (including a wheelchair)?: A Little Help needed standing up from a chair using your arms (e.g., wheelchair or bedside chair)?: A Little Help needed to walk in hospital room?: A Little Help needed climbing 3-5 steps with a railing? : A Lot 6 Click Score: 17    End of Session   Activity Tolerance: Patient tolerated treatment well Patient left: in chair;with call bell/phone within reach Nurse Communication: Mobility status PT Visit Diagnosis: Unsteadiness on feet (R26.81);Other abnormalities of gait and mobility (R26.89);Muscle weakness (generalized) (M62.81) Pain - Right/Left: Right Pain - part of body:  (foot, R knee)     Time: 2694-8546 PT Time Calculation (min) (ACUTE ONLY): 21 min  Charges:  $Gait Training: 8-22 mins                     05/29/2021  Darren Halim., PT Acute Rehabilitation Services [phone]  (pager) [phone]  (office)   [name] 05/29/2021, 3:09 PM

## 2021-05-29 NOTE — Progress Notes (Signed)
ANTICOAGULATION CONSULT NOTE - Follow Up Consult  Pharmacy Consult for warfarin Indication: atrial fibrillation and Pta LV thrombus  No Known Allergies  Patient Measurements: Height: 5\' 8"  (172.7 cm) Weight: 91 kg (200 lb 9.9 oz) IBW/kg (Calculated) : 68.4 Heparin Dosing Weight: 87 kg  Vital Signs: Temp: 97.8 F (36.6 C) (07/21 0752) Temp Source: Oral (07/21 0752) BP: 165/97 (07/21 0752) Pulse Rate: 82 (07/21 0409)  Labs: Recent Labs    05/27/21 0053 05/27/21 1357 05/28/21 0636 05/29/21 0034  HGB 11.7*  --  12.2*  --   HCT 36.4*  --  36.9*  --   PLT 254  --  246  --   LABPROT 22.7*  --  23.8* 24.0*  INR 2.0*  --  2.1* 2.2*  CREATININE 1.21 1.17 1.00 1.17     Estimated Creatinine Clearance: 64.3 mL/min (by C-G formula based on SCr of 1.17 mg/dL).   Medications:  Infusions:   sodium chloride      Assessment: 70 yo m with afib and LV thrombus (PTA coumadin) bridged with heparin for toe amputation 7/14. Pharmacy asked to dose enoxaparin and warfarin.   INR remains therapeutic at 2.2 today, enoxaparin stopped 7/19. Discharge to SNF pending.  *Home dose 3mg  Tues/Sat, 6mg  AODs  Goal of Therapy:  INR 2-3 Monitor platelets by anticoagulation protocol: Yes   Plan:  Warfarin 6mg  x1 tonight Daily INR At discharge - recommend home regimen above   8/19, PharmD, BCPS, [facility] Clinical Pharmacist [phone] Please check AMION for all [facility] numbers 05/29/2021

## 2021-05-29 NOTE — TOC Transition Note (Addendum)
Transition of Care [facility]) - CM/SW Discharge Note   Patient Details  Name: [name] MRN: [id number] Date of Birth: 1950/11/24  Transition of Care [facility]) CM/SW Contact:  Lynett Grimes Phone Number: 05/29/2021, 2:08 PM   Clinical Narrative:    Patient will DC to: [facility] Anticipated DC date: 05/29/2021 Family notified: Pt Daughter Transport by: Sharin Mons   Per MD patient ready for DC to [facility] room 211. RN to call report prior to discharge [phone]). RN, patient, patient's family, and facility notified of DC. Discharge Summary and FL2 sent to facility. DC packet on chart. Ambulance transport requested for patient.   CSW will sign off for now as social work intervention is no longer needed. Please consult [place] again if new needs arise.     Final next level of care: Skilled Nursing Facility Barriers to Discharge: Insurance Authorization   Patient Goals and CMS Choice Patient states their goals for this hospitalization and ongoing recovery are:: Rehab CMS Medicare.gov Compare Post Acute Care list provided to:: Patient Choice offered to / list presented to : Patient, Adult Children  Discharge Placement                       Discharge Plan and Services In-house Referral: Clinical Social Work Discharge Planning Services: CM Consult Post Acute Care Choice: Skilled Nursing Facility                               Social Determinants of Health (SDOH) Interventions     Readmission Risk Interventions No flowsheet data found.

## 2021-05-29 NOTE — Plan of Care (Signed)
  Problem: Education: Goal: Ability to describe self-care measures that may prevent or decrease complications (Diabetes Survival Skills Education) will improve Outcome: Progressing Goal: Individualized Educational Video(s) Outcome: Progressing   Problem: Coping: Goal: Ability to adjust to condition or change in health will improve Outcome: Progressing   Problem: Fluid Volume: Goal: Ability to maintain a balanced intake and output will improve Outcome: Progressing   Problem: Health Behavior/Discharge Planning: Goal: Ability to identify and utilize available resources and services will improve Outcome: Progressing Goal: Ability to manage health-related needs will improve Outcome: Progressing   Problem: Metabolic: Goal: Ability to maintain appropriate glucose levels will improve Outcome: Progressing   Problem: Nutritional: Goal: Maintenance of adequate nutrition will improve Outcome: Progressing Goal: Progress toward achieving an optimal weight will improve Outcome: Progressing   Problem: Skin Integrity: Goal: Risk for impaired skin integrity will decrease Outcome: Progressing   Problem: Education: Goal: Knowledge of General Education information will improve Description: Including pain rating scale, medication(s)/side effects and non-pharmacologic comfort measures Outcome: Progressing   Problem: Health Behavior/Discharge Planning: Goal: Ability to manage health-related needs will improve Outcome: Progressing   Problem: Clinical Measurements: Goal: Ability to maintain clinical measurements within normal limits will improve Outcome: Progressing Goal: Will remain free from infection Outcome: Progressing Goal: Diagnostic test results will improve Outcome: Progressing Goal: Respiratory complications will improve Outcome: Progressing Goal: Cardiovascular complication will be avoided Outcome: Progressing   Problem: Activity: Goal: Risk for activity intolerance will  decrease Outcome: Progressing   Problem: Nutrition: Goal: Adequate nutrition will be maintained Outcome: Progressing   Problem: Coping: Goal: Level of anxiety will decrease Outcome: Progressing   Problem: Pain Managment: Goal: General experience of comfort will improve Outcome: Progressing   Problem: Safety: Goal: Ability to remain free from injury will improve Outcome: Progressing   Problem: Skin Integrity: Goal: Risk for impaired skin integrity will decrease Outcome: Progressing

## 2021-05-29 NOTE — Progress Notes (Signed)
Patient seen and examined this morning, remained stable for discharge.  Please refer to discharge summary 7/19 which has been updated today  Phillippa Straub M. Elvera Lennox, MD, PhD [facility]

## 2021-05-30 ENCOUNTER — Non-Acute Institutional Stay (SKILLED_NURSING_FACILITY): Payer: Medicare HMO | Admitting: Adult Health

## 2021-05-30 ENCOUNTER — Encounter: Payer: Self-pay | Admitting: Adult Health

## 2021-05-30 DIAGNOSIS — E1165 Type 2 diabetes mellitus with hyperglycemia: Secondary | ICD-10-CM

## 2021-05-30 DIAGNOSIS — I70249 Atherosclerosis of native arteries of left leg with ulceration of unspecified site: Secondary | ICD-10-CM

## 2021-05-30 DIAGNOSIS — I513 Intracardiac thrombosis, not elsewhere classified: Secondary | ICD-10-CM

## 2021-05-30 DIAGNOSIS — I480 Paroxysmal atrial fibrillation: Secondary | ICD-10-CM

## 2021-05-30 DIAGNOSIS — E1129 Type 2 diabetes mellitus with other diabetic kidney complication: Secondary | ICD-10-CM

## 2021-05-30 DIAGNOSIS — IMO0002 Reserved for concepts with insufficient information to code with codable children: Secondary | ICD-10-CM

## 2021-05-30 DIAGNOSIS — I10 Essential (primary) hypertension: Secondary | ICD-10-CM

## 2021-05-30 DIAGNOSIS — I5042 Chronic combined systolic (congestive) and diastolic (congestive) heart failure: Secondary | ICD-10-CM

## 2021-05-30 DIAGNOSIS — I96 Gangrene, not elsewhere classified: Secondary | ICD-10-CM

## 2021-05-30 DIAGNOSIS — F419 Anxiety disorder, unspecified: Secondary | ICD-10-CM

## 2021-05-30 NOTE — Progress Notes (Addendum)
Location:  [facility] Room Number: 211 A Place of Service:  SNF (31) Provider:  Kenard Gower, DNP, FNP-BC  Patient Care Team: Patient, No Pcp Per (Inactive) as PCP - General (General Practice) Bensimhon, Bevelyn Buckles, MD as PCP - Advanced Heart Failure (Cardiology) Lennette Bihari, MD as Consulting Physician (Cardiology)  Extended Emergency Contact Information Primary Emergency Contact: Hagy,Elaina Mobile Phone: [phone] Relation: Daughter Secondary Emergency Contact: Dawayne Patricia [place] of [place] Home Phone: [phone] Mobile Phone: [phone] Relation: Other  Code Status:  FULL CODE  Goals of care: Advanced Directive information Advanced Directives 05/30/2021  Does Patient Have a Medical Advance Directive? No  Would patient like information on creating a medical advance directive? -  Pre-existing out of facility DNR order (yellow form or pink MOST form) -  Some encounter information is confidential and restricted. Go to Review Flowsheets activity to see all data.     Chief Complaint  Patient presents with   Hospitalization Follow-up    HPI:  Pt is a 70 y.o. male who was admitted to [facility] on 05/29/21 post hospital admission 05/15/21 to 05/29/21.  He has a PMH of chronic combined congestive heart failure, hypertension, PAF on Coumadin, left ventricular thrombus, chronic kidney disease stage IIIa and diabetes mellitus.  He presented to the hospital complaining of left pinky toe turning black.  He was found to have necrosis with maggots an febrile 100.3.  Orthopedic and vascular were consulted.  He underwent arteriogram on 05/18/2021 with multiple lesions all  angioplastied.  Amputation of left big toe was performed on 05/22/2021.  He was transferred to [facility] for short-term rehabilitation.  He was seen in the room today. He denies having pain on his left foot.  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    Past Medical History:  Diagnosis Date   A-fib [facility])    a. (06/04/14) TEE-DC-CV; succesful; large LA 6.2 cm   A-fib (HCC)    Ascites    CHF (congestive heart failure) (HCC)    Chronic systolic heart failure (HCC)    a. ECHO (05/2014): EF 25-30%, diff HK, RV midly dilated and sys fx mildly/mod reduced   CKD (chronic kidney disease)    CKD (chronic kidney disease) stage 3, GFR 30-59 ml/min (HCC)    Depression    Diabetes mellitus without complication (HCC)    HTN (hypertension)    Hypertension    OSA (obstructive sleep apnea)    Type 2 diabetes mellitus (HCC)    Past Surgical History:  Procedure Laterality Date   ABDOMINAL AORTOGRAM W/LOWER EXTREMITY N/A 05/19/2021   Procedure: ABDOMINAL AORTOGRAM W/LOWER EXTREMITY;  Surgeon: Cephus Shelling, MD;  Location: [facility] INVASIVE CV LAB;  Service: Cardiovascular;  Laterality: N/A;   AMPUTATION Left 05/22/2021   Procedure: LEFT FIFTH TOE AMPUTATION;  Surgeon: Cephus Shelling, MD;  Location: [facility] OR;  Service: Vascular;  Laterality: Left;   CARDIOVERSION N/A 06/04/2014   Procedure: CARDIOVERSION;  Surgeon: Laurey Morale, MD;  Location: [facility] ENDOSCOPY;  Service: Cardiovascular;  Laterality: N/A;   NOSE SURGERY     Nasal septum surgery   PERIPHERAL VASCULAR INTERVENTION Left 05/19/2021   Procedure: PERIPHERAL VASCULAR INTERVENTION;  Surgeon: Cephus Shelling, MD;  Location: [facility] INVASIVE CV LAB;  Service: Cardiovascular;  Laterality: Left;   TEE WITHOUT CARDIOVERSION N/A 06/04/2014  Procedure: TRANSESOPHAGEAL ECHOCARDIOGRAM (TEE);  Surgeon: Laurey Morale, MD;  Location: [facility] ENDOSCOPY;  Service: Cardiovascular;  Laterality: N/A;   TEE WITHOUT CARDIOVERSION N/A 01/06/2016   Procedure: TRANSESOPHAGEAL ECHOCARDIOGRAM (TEE);  Surgeon: Pricilla Riffle, MD;  Location: [facility] ENDOSCOPY;  Service: Cardiovascular;   Laterality: N/A;   VASECTOMY      No Known Allergies  Outpatient Encounter Medications as of 05/30/2021  Medication Sig   acetaminophen (TYLENOL) 500 MG tablet Take 1,000 mg by mouth every 6 (six) hours as needed.   Ascorbic Acid (VITAMIN C) 1000 MG tablet Take 1,000 mg by mouth daily.   atorvastatin (LIPITOR) 10 MG tablet Take 1 tablet (10 mg total) by mouth daily.   bisacodyl (DULCOLAX) 10 MG suppository Place 10 mg rectally as needed for moderate constipation. If not relieved by MOM, give 10 mg Bisacodyl suppositiory rectally X 1 dose in 24 hours as needed   clopidogrel (PLAVIX) 75 MG tablet Take 1 tablet (75 mg total) by mouth daily with breakfast.   diclofenac Sodium (VOLTAREN) 1 % GEL Apply 2 g topically 4 (four) times daily.   feeding supplement, GLUCERNA SHAKE, (GLUCERNA SHAKE) LIQD Take 237 mLs by mouth 2 (two) times daily between meals.   furosemide (LASIX) 20 MG tablet Take 1 tablet (20 mg total) by mouth daily.   hydrOXYzine (ATARAX/VISTARIL) 10 MG tablet Take 1 tablet (10 mg total) by mouth 3 (three) times daily as needed for anxiety.   insulin glargine (LANTUS) 100 UNIT/ML injection Inject 0.08 mLs (8 Units total) into the skin daily.   losartan (COZAAR) 50 MG tablet Take 1 tablet (50 mg total) by mouth daily.   metoprolol succinate (TOPROL-XL) 100 MG 24 hr tablet Take 1 tablet (100 mg total) by mouth 2 (two) times daily.   Sodium Phosphates (RA SALINE ENEMA RE) Place 1 Dose rectally. If not relieved by Biscodyl suppository, give disposable Saline Enema rectally X 1 dose/24 hrs as needed   vitamin B-12 1000 MCG tablet Take 1 tablet (1,000 mcg total) by mouth daily.   Vitamin D, Ergocalciferol, (DRISDOL) 1.25 MG (50000 UNIT) CAPS capsule Take 1 capsule (50,000 Units total) by mouth every Thursday.   [DISCONTINUED] clotrimazole (LOTRIMIN) 1 % cream Apply 1 application topically 2 (two) times daily.   [DISCONTINUED] warfarin (COUMADIN) 6 MG tablet Take 1 tablet (6 mg total) by mouth  See admin instructions. Takes 3 mg on Tuesday and Saturday and 6 mg all other days   No facility-administered encounter medications on file as of 05/30/2021.    Review of Systems  GENERAL: No change in appetite, no fatigue, no weight changes, no fever or chills MOUTH and THROAT: Denies oral discomfort, gingival pain or bleeding RESPIRATORY: no cough, SOB, DOE, wheezing, hemoptysis CARDIAC: No chest pain, edema or palpitations GI: No abdominal pain, diarrhea, constipation, heart burn, nausea or vomiting GU: Denies dysuria, frequency, hematuria or discharge NEUROLOGICAL: Denies dizziness, syncope, numbness, or headache PSYCHIATRIC: Denies feelings of depression or anxiety. No report of hallucinations, insomnia, paranoia, or agitation   Immunization History  Administered Date(s) Administered   Tdap 05/01/2016   Pertinent  Health Maintenance Due  Topic Date Due   FOOT EXAM  Never done   OPHTHALMOLOGY EXAM  Never done   COLONOSCOPY (Pts 45-42yrs Insurance coverage will need to be confirmed)  Never done   PNA vac Low Risk Adult (1 of 2 - PCV13) Never done   INFLUENZA VACCINE  06/09/2021   HEMOGLOBIN A1C  11/22/2021   No flowsheet data found.  Vitals:   05/30/21 1107  BP: (!) 146/93  Pulse: 89  Resp: 19  Temp: (!) 97.2 F (36.2 C)  Weight: 201 lb 6.4 oz (91.4 kg)  Height: 5\' 8"  (1.727 m)   Body mass index is 30.62 kg/m.  Physical Exam  GENERAL APPEARANCE: Well nourished. In no acute distress.  Obese.  SKIN:  Left 5th toe amputation site with dressing, dry and no erythema MOUTH and THROAT: Lips are without lesions. Oral mucosa is moist and without lesions.  RESPIRATORY: Breathing is even & unlabored, BS CTAB CARDIAC: RRR, no murmur,no extra heart sounds, no edema GI: Abdomen soft, normal BS, no masses, no tenderness NEUROLOGICAL: There is no tremor. Speech is clear.  Alert and oriented X 3. PSYCHIATRIC:  Affect and behavior are appropriate  Labs reviewed: Recent Labs     05/26/21 0606 05/27/21 0053 05/27/21 1357 05/28/21 0636 05/29/21 0034  NA 137  --  137 134*  --   K 4.7  --  5.2* 4.6  --   CL 104  --  99 101  --   CO2 28  --  31 26  --   GLUCOSE 165*  --  178* 190*  --   BUN 13  --  18 18  --   CREATININE 1.14   < > 1.17 1.00 1.17  CALCIUM 8.8*  --  9.3 9.0  --    < > = values in this interval not displayed.   Recent Labs    07/16/20 1044 05/15/21 1853  AST 18 16  ALT 25 23  ALKPHOS 29* 42  BILITOT 1.1 1.1  PROT 7.6 7.9  ALBUMIN 3.9 3.9   Recent Labs    05/15/21 1853 05/16/21 0310 05/26/21 0606 05/27/21 0053 05/28/21 0636  WBC 10.7*   < > 7.7 9.2 7.8  NEUTROABS 7.8*  --   --   --   --   HGB 13.2   < > 12.5* 11.7* 12.2*  HCT 40.2   < > 38.0* 36.4* 36.9*  MCV 89.1   < > 89.4 90.1 88.3  PLT 211   < > 228 254 246   < > = values in this interval not displayed.   Lab Results  Component Value Date   TSH 3.565 03/15/2016   Lab Results  Component Value Date   HGBA1C 10.4 (H) 05/22/2021   Lab Results  Component Value Date   CHOL 106 03/15/2016   HDL 16 (L) 03/15/2016   LDLCALC 75 03/15/2016   TRIG 77 03/15/2016   CHOLHDL 6.6 03/15/2016    Significant Diagnostic Results in last 30 days:  DG Knee 1-2 Views Right  Result Date: 05/21/2021 CLINICAL DATA:  Persistent right knee pain times multiple days EXAM: RIGHT KNEE - 1-2 VIEW COMPARISON:  None. FINDINGS: No evidence of fracture, dislocation, or joint effusion. Chondrocalcinosis. Tricompartment degenerative change worse in the medial tibiofemoral compartment where it is moderate. Vascular calcifications. IMPRESSION: 1. No acute osseous abnormality. 2. 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 Electronically Signed   By: Maudry Mayhew MD   On: 05/21/2021 17:20   PERIPHERAL VASCULAR CATHETERIZATION  Result Date: 05/19/2021 Formatting of this result is different from the original. 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  Ultrasound-guided access of right common femoral artery 2.  Aortogram including catheter selection of aorta 3.  Bilateral lower extremity arteriogram with selection third order branches left lower extremity 4.  Left peroneal angioplasty (3 mm x 40 mm Sterling) 5.  Mynx closure of the right common femoral artery 6.  43 minutes of monitored moderate conscious sedation time   Indications: Patient is a 70 year old male who was seen in consultation over the weekend by Dr. Lenell Antu with left fifth toe gangrene and ischemic changes.  He had abnormal monophasic waveforms at the ankle.  He presents today for aortogram, left lower extremity arteriogram, and possible intervention after risk benefits discussed.   Findings: Aortogram showed patent renal arteries bilaterally with no flow-limiting stenosis in the aortoiliac segment.  The left lower extremity which is the side of interest showed a patent common femoral, profunda, SFA, above and below-knee popliteal artery without flow-limiting stenosis.  He has severe tibial disease with only peroneal runoff.  There was a 50% stenosis in the proximal peroneal, an 80% focal stenosis in the mid peroneal, and a short subtotal occlusion of the distal peroneal.  Th peroneal did reconstitute a PT at the ankle.  The peroneal lesion was then crossed and all lesions were angioplastied with 3 mm Sterling with excellent results and no residual stenosis.   Right lower extremity also shows patent common femoral, profunda, SFA, popliteal with tibial disease and only single peroneal runoff but also has a high-grade stenosis in the distal vessel reconstituting the posterior tibial             Procedure:  The patient was identified in the holding area  and taken to room 8.  The patient was then placed supine on the table and prepped and draped in the usual sterile fashion.  A time out was called.  Ultrasound was used to evaluate the right common femoral artery.  It was patent .  A digital ultrasound image was acquired.  A micropuncture needle was used to access the right common femoral artery under ultrasound guidance.  An 018 wire was advanced without resistance and a micropuncture sheath was placed.  The 018 wire was removed and a benson wire was placed.  The micropuncture sheath was exchanged for a 5 french sheath.  An omniflush catheter was advanced over the wire to the level of L-1.  An abdominal angiogram was obtained.  Next the catheter was pulled down and bilateral lower extremity runoff was obtained.  After evaluating images elected to intervene on the left peroneal artery.  I crossed the aortic bifurcation and placed my catheter into the left SFA where then placed a Rosen wire we exchanged for a long 5 French Ansell sheath in the right groin over the aortic bifurcation.  Patient was given 100 units/kg IV heparin.  I then used a long straight 100 cm flush catheter to get better dedicated images of the left peroneal artery.  We identified 3 lesions as noted above.  Lesions were then all crossed with a V 18 wire.  I then angioplastied each individual lesion with a 3 mm x 40 mm Sterling to nominal pressure for 2 minutes each.  There was no residual stenosis or dissection with a widely patent vessel.  Satisfied the results wires and catheters were removed.  I exchanged for a short 5 French sheath in the right groin.  Mynx closure device was deployed.  Plan: Patient will be loaded on Plavix.  He now has inline flow and is optimized.   Cephus Shellinghristopher J. Clark, MD [facility] [place] Office: [phone]  DG Toe 5th Left  Result Date: 05/15/2021 CLINICAL DATA:  Pinky toe pain for approx 1 month. Decay and blackening on toe. Maggots  removed today in ER. Hx: diabetes, gangrene EXAM: DG TOE 5TH LEFT COMPARISON:  None. FINDINGS: Cortical irregularity and mild periosteal reaction of the proximal fifth phalanx. No definite cortical erosion or destruction. Question may be nondisplaced fracture. No definite acute displaced fracture or dislocation. Associated superimpose subcutaneus soft tissue edema and emphysema. IMPRESSION: 1. Cortical irregularity of the proximal fifth phalanx. No definite cortical erosion or destruction. Associated superimposed subcutaneus soft tissue edema and emphysema. Consider MRI foot with intravenous contrast (if GFR greater than 30) for further evaluation of possible osteomyelitis. 2. Question maybe nondisplaced fracture. Electronically Signed   By: Tish FredericksonMorgane  Naveau M.D.   On: 05/15/2021 23:19   VAS US ABI WITH/WO TBI  Result Date: 05/17/2021  LOWER EXTREMITY DOPPLER STUDY Patient Name:  Wayne BothJoseph A Schwartzman  Date of Exam:   05/16/2021 Medical Rec #: [id number]          Accession #:    [id number] Date of Birth: 1950-12-04         Patient Gender: M Patient Age:   69Y [facility] Procedure:      VAS US ABI WITH/WO TBI Referring Phys: [id number] MARCUS V DUDA --------------------------------------------------------------------------------  Indications: Left fifth toe dry gangrene. High Risk Factors: Hypertension, Diabetes, current smoker.  Comparison Study: No prior study Performing Technologist: Gertie FeySimonetti, Michelle MHA, RVT, RDCS, RDMS  Examination Guidelines: A complete evaluation includes at minimum, Doppler waveform signals and systolic blood pressure reading at the level of bilateral brachial, anterior tibial, and posterior tibial arteries, when vessel segments are accessible. Bilateral testing is considered an integral part of a complete examination. Photoelectric Plethysmograph (PPG) waveforms and toe systolic pressure readings are included as required and additional duplex testing as needed. Limited  examinations for reoccurring indications may be performed as noted.  ABI Findings: +--------+------------------+-----+----------+--------+ Right   Rt Pressure (mmHg)IndexWaveform  Comment  +--------+------------------+-----+----------+--------+ [id number]                    triphasic          +--------+------------------+-----+----------+--------+ PTA     139               1.11 monophasic         +--------+------------------+-----+----------+--------+ DP      118               0.94 monophasic         +--------+------------------+-----+----------+--------+ +--------+------------------+-----+----------+-------+ Left    Lt Pressure (mmHg)IndexWaveform  Comment +--------+------------------+-----+----------+-------+ [id number]                    triphasic         +--------+------------------+-----+----------+-------+ PTA     100               0.80 monophasic        +--------+------------------+-----+----------+-------+ DP      84                0.67 monophasic        +--------+------------------+-----+----------+-------+ +-------+-----------+-----------+------------+------------+ ABI/TBIToday's ABIToday's TBIPrevious ABIPrevious TBI +-------+-----------+-----------+------------+------------+ Right  1.11       0.24                                +-------+-----------+-----------+------------+------------+  Left   0.80       0.21                                +-------+-----------+-----------+------------+------------+  Summary: Right: Resting right ankle-brachial index is within normal range, however given abnormal waveform this is likely falsely elevated due to medial arterial calcification. The right toe-brachial index is abnormal. Left: Resting left ankle-brachial index indicates mild left lower extremity arterial disease. The left toe-brachial index is abnormal.  *See table(s) above for measurements and observations.  Electronically signed by Heath Lark on 05/17/2021 at 4:30:05 PM.    Final    DG HIP UNILAT WITH PELVIS 2-3 VIEWS RIGHT  Result Date: 05/23/2021 CLINICAL DATA:  Chronic right hip pain EXAM: DG HIP (WITH OR WITHOUT PELVIS) 2-3V RIGHT COMPARISON:  None. FINDINGS: Advanced degenerative change right hip with joint space narrowing, bony sclerosis, and cystic change in the femoral head and adjacent acetabulum. 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 Negative for fracture Negative left hip.  No pelvic bone lesion identified. 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 Electronically Signed   By: Marlan Palau M.D.   On: 05/23/2021 12:49    Assessment/Plan  1. Toe necrosis (HCC) -   S/P amputation of left fifth toe on 05/22/2021 -    heel WBAT and Darco shoe -   Continue vitamin C -   Follow-up with vascular surgery  2. Chronic combined systolic and diastolic congestive heart failure (HCC) -  no SOB, continue furosemide  3. Primary hypertension -   Continue losartan and metoprolol succinate ER  4. PAF (paroxysmal atrial fibrillation) (HCC) -    rate controlled, continue Coumadin for anticoagulation and metoprolol succinate ER for rate control  5. Left ventricular thrombus -  INR 3.3, he was already given Coumadin for today according to charge nurse, will re-check INR tomorrow  6. Type 2 diabetes, uncontrolled, with renal manifestation (HCC) Lab Results  Component Value Date   HGBA1C 10.4 (H) 05/22/2021   -   Continue Lantus 8 units daily  7. 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  Anxiety -   Continue PRN hydroxyzine -    psych consult    Family/ staff Communication:   Discussed plan of care with resident, son, sister and charge  nurse.  Labs/tests ordered:  INR  Goals of care:   Short-term care   Kenard Gower, DNP, MSN, FNP-BC [facility] [phone] (Monday-Friday 8:00 a.m. - 5:00 p.m.) [phone] (after hours)

## 2021-06-02 ENCOUNTER — Encounter: Payer: Self-pay | Admitting: Internal Medicine

## 2021-06-02 ENCOUNTER — Non-Acute Institutional Stay (SKILLED_NURSING_FACILITY): Payer: Medicare HMO | Admitting: Internal Medicine

## 2021-06-02 DIAGNOSIS — E1129 Type 2 diabetes mellitus with other diabetic kidney complication: Secondary | ICD-10-CM

## 2021-06-02 DIAGNOSIS — E1165 Type 2 diabetes mellitus with hyperglycemia: Secondary | ICD-10-CM

## 2021-06-02 DIAGNOSIS — I739 Peripheral vascular disease, unspecified: Secondary | ICD-10-CM | POA: Diagnosis not present

## 2021-06-02 DIAGNOSIS — N1832 Chronic kidney disease, stage 3b: Secondary | ICD-10-CM

## 2021-06-02 DIAGNOSIS — I10 Essential (primary) hypertension: Secondary | ICD-10-CM | POA: Diagnosis not present

## 2021-06-02 DIAGNOSIS — I482 Chronic atrial fibrillation, unspecified: Secondary | ICD-10-CM | POA: Diagnosis not present

## 2021-06-02 DIAGNOSIS — I96 Gangrene, not elsewhere classified: Secondary | ICD-10-CM | POA: Diagnosis not present

## 2021-06-02 DIAGNOSIS — IMO0002 Reserved for concepts with insufficient information to code with codable children: Secondary | ICD-10-CM

## 2021-06-02 NOTE — Patient Instructions (Signed)
See assessment and plan under each diagnosis in the problem list and acutely for this visit 

## 2021-06-02 NOTE — Assessment & Plan Note (Signed)
SNF Wound Care Nurse monitoring resident. She reports op site stable  Refer to Wound Care Clinic if progressive

## 2021-06-02 NOTE — Assessment & Plan Note (Signed)
DM with renal & vascular complications Glucose range as IP @ [facility]:354-562 ( both outliers) Current A1c: 10.4% A1c goal : < 8% No hypoglycemia Monitor @ SNF; he needs PCP to manage his DM

## 2021-06-02 NOTE — Assessment & Plan Note (Signed)
Rate controlled On warfarin prophylaxis

## 2021-06-02 NOTE — Assessment & Plan Note (Signed)
Creat peak 1.65 w GFR 44; CKD Stage 3b Pre discharge creat 1.17/GFR > 60; Stage 2 Medication List reviewed; no nephrotoxic agents identified.

## 2021-06-02 NOTE — Assessment & Plan Note (Signed)
BP poorly controlled despite BB & ARB antihypertensive medications Add CCB

## 2021-06-02 NOTE — Progress Notes (Signed)
NURSING HOME LOCATION:  [facility] ROOM NUMBER:  [id number]  CODE STATUS:  Full Code  PCP:  no PCP; he plans to establish with physician on [employer] panel  This is a comprehensive admission note to this SNFperformed on this date less than 30 days from date of admission. Included are preadmission medical/surgical history; reconciled medication list; family history; social history and comprehensive review of systems.  Corrections and additions to the records were documented. Comprehensive physical exam was also performed. Additionally a clinical summary was entered for each active diagnosis pertinent to this admission in the Problem List to enhance continuity of care.  HPI: Patient was hospitalized 7/7 - 05/29/2021 presenting from home with complaint of the left fifth toe turning black.  Necrosis was present as were maggots.  Temperature was 100.3.  Arteriogram was performed 7/10; multiple lesions were angioplastied.  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 Post op heel weightbearing with Darco was recommended. He received IV cefepime and Flagyl for 9 days.  Postop Plavix was initiated. Baseline creatinine was felt to be 1.2-1.3.  With creatinine rise; Lasix was held and IV fluids administered.  Renal function improved with these measures. Echo had revealed an ejection fraction of 15-20% with LV thrombus in May 2017.  October 5,2021 EF was 55- 60%. No thrombus present.   Lasix dosage was decreased due to orthostatic hypotension.  Warfarin was reinitiated because of PAF in the context of the history of left ventricular thrombus.  Lantus was initiated for poorly controlled diabetes as evidenced by an A1c of 10.4 %. Course was complicated by right knee pain. Tricompartmental degenerative change was noted with chondrocalcinosis.  He received colchicine as well as topical Voltaren gel. 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  Orthopedic follow-up post discharge was recommended. Hypokalemia was corrected.  He did exhibit mild thrombocytopenia for which monitor was recommended. Psychiatry was consulted because of "negative thoughts" in context of PMH of depression.  Hydroxyzine was prescribed for anxiety.  Psych did clear him for discharge.  Past medical and surgical history: Includes A. fib, diabetes with CKD, OSA, history of congestive heart failure, history of depression, and essential hypertension. 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  Social history: History of both alcohol and intake and smoking previously.  He states that he does neither now.  Family history: Reviewed; positive for diabetes and heart disease.  Today he tells me that his sister may have had cardiac issues but he is unsure.   Review of systems: He validates that he does not have a PCP but plans to establish with one on the [facility] panel.  He denies any pain at the operative site.  He states he does have numbness in his feet.  He also validates that he was worked up for sleep apnea but was intolerant to CPAP.  He intimates that he missed an ophthalmology consult because of the toe wound; apparently he has been evaluated for glaucoma. When asked about any active anxiety or depression he laughed. He denies any symptoms of congestive heart failure at this time.  Constitutional: No fever, significant weight change  Eyes: No redness, discharge, pain, vision change ENT/mouth: No nasal congestion, purulent discharge, earache, change in hearing, sore throat  Cardiovascular: No chest pain, palpitations, paroxysmal nocturnal dyspnea, claudication, edema  Respiratory: No cough, sputum production, hemoptysis, DOE,  significant snoring, apnea despite no CPAP use Gastrointestinal: No heartburn, dysphagia, abdominal pain, nausea  /vomiting, rectal bleeding, melena, change in bowels Genitourinary: No dysuria, hematuria, pyuria, incontinence, nocturia Musculoskeletal: No joint stiffness, joint swelling, weakness Dermatologic: No rash, pruritus, change in appearance of skin Neurologic: No dizziness, headache, syncope, seizures Psychiatric: No significant insomnia, anorexia Endocrine: No change in hair/skin/nails, excessive thirst, excessive hunger, excessive urination  Hematologic/lymphatic: No significant bruising, lymphadenopathy, abnormal bleeding  Physical exam:  Pertinent or positive findings: Affect is flat.  He tended to keep his eyes closed throughout the interview.  Responses were slow.  Pattern alopecia is present.  His remaining hair is full and unkempt.  He has a full mustache and beard.  There is dental staining.  Cardiac rhythm is irregular.  Breath sounds are decreased.  He has central obesity.  Pedal pulses are decreased.  The left thumb is amputated as is the fifth left toe.  He has sclerotic lesions over the shins with some faint hyperpigmentation.  He has keratoses with some hyperpigmentation over the scalp.  General appearance: Adequately nourished; no acute distress, increased work of breathing is present.   Lymphatic: No lymphadenopathy about the head, neck, axilla. Eyes: No conjunctival inflammation or lid edema is present. There is no scleral icterus. Ears:  External ear exam shows no significant lesions or deformities.   Nose:  External nasal examination shows no deformity or inflammation. Nasal mucosa are pink and moist without lesions, exudates Neck:  No thyromegaly, masses, tenderness noted.    Heart:  No gallop, murmur, click, rub.  Lungs:  without wheezes, rhonchi, rales, rubs. Abdomen: Bowel sounds are normal.  Abdomen is soft and nontender with no organomegaly, hernias, masses. GU: Deferred  Extremities:  No cyanosis, clubbing, edema. Neurologic exam:  Balance, Rhomberg, finger to nose  testing could not be completed due to clinical state Skin: Warm & dry w/o tenting. No significant  rash.  See clinical summary under each active problem in the Problem List with associated updated therapeutic plan

## 2021-06-02 NOTE — Assessment & Plan Note (Signed)
Vascular risks of uncontrolled DM discussed (68% increased MI or CVA risk w A1c of 10.4%)

## 2021-06-09 ENCOUNTER — Non-Acute Institutional Stay (SKILLED_NURSING_FACILITY): Payer: Medicare HMO | Admitting: Adult Health

## 2021-06-09 ENCOUNTER — Encounter: Payer: Self-pay | Admitting: Adult Health

## 2021-06-09 ENCOUNTER — Other Ambulatory Visit: Payer: Self-pay

## 2021-06-09 DIAGNOSIS — I96 Gangrene, not elsewhere classified: Secondary | ICD-10-CM | POA: Diagnosis not present

## 2021-06-09 DIAGNOSIS — I5042 Chronic combined systolic (congestive) and diastolic (congestive) heart failure: Secondary | ICD-10-CM

## 2021-06-09 DIAGNOSIS — IMO0002 Reserved for concepts with insufficient information to code with codable children: Secondary | ICD-10-CM

## 2021-06-09 DIAGNOSIS — E1165 Type 2 diabetes mellitus with hyperglycemia: Secondary | ICD-10-CM

## 2021-06-09 DIAGNOSIS — I10 Essential (primary) hypertension: Secondary | ICD-10-CM | POA: Diagnosis not present

## 2021-06-09 DIAGNOSIS — E1129 Type 2 diabetes mellitus with other diabetic kidney complication: Secondary | ICD-10-CM | POA: Diagnosis not present

## 2021-06-09 DIAGNOSIS — I739 Peripheral vascular disease, unspecified: Secondary | ICD-10-CM

## 2021-06-09 NOTE — Progress Notes (Signed)
Location:  [facility] Room Number: 211 A Place of Service:  SNF (31) Provider:  Kenard GowerMedina-Vargas, Shikara Mcauliffe, DNP, FNP-BC  Patient Care Team: Ngetich, Donalee Citrininah C, NP as PCP - General (Family Medicine) Bensimhon, Bevelyn Bucklesaniel R, MD as PCP - Advanced Heart Failure (Cardiology) Lennette BihariKelly, Thomas A, MD as Consulting Physician (Cardiology)  Extended Emergency Contact Information Primary Emergency Contact: Casillas,Elaina Mobile Phone: [phone] Relation: Daughter Secondary Emergency Contact: Dawayne PatriciaMcCarthy,Diane  [place] Home Phone: [phone] Mobile Phone: [phone] Relation: Other  Code Status:  FULL CODE  Goals of care: Advanced Directive information Advanced Directives 06/13/2021  Does Patient Have a Medical Advance Directive? No  Would patient like information on creating a medical advance directive? No - Patient declined  Pre-existing out of facility DNR order (yellow form or pink MOST form) -  Some encounter information is confidential and restricted. Go to Review Flowsheets activity to see all data.     Chief Complaint  Patient presents with   Acute Visit    Short term rehabilitation    HPI:  Pt is a 70 y.o. male seen today for short-term rehabilitation visit. He is currently having PT, OT and ST. He was [facility] on 05/29/21  post hospitalization 05/15/20 to 05/29/21. He had necrosis with maggots of his left pinky toe which turned black.  Orthopedic and vascular were consulted.  He underwent arteriogram on 05/18/2021 with multiple lesions all angioplastied.  Amputation of left big toe was performed on 05/22/2021. There was no reported SOB. He takes furosemide 20 mg daily for CHF.  SBPs ranging from 130-178.  He takes losartan 50 mg daily, metoprolol succinate ER 100 mg twice a day and amlodipine 5 mg daily for hypertension.  He denies having headaches. CBGs ranging from 1 41-250.  He takes Lantus 8 units daily for diabetes  mellitus.    Past Medical History:  Diagnosis Date   A-fib [facility](HCC)    a. (06/04/14) TEE-DC-CV; succesful; large LA 6.2 cm   ADHD    Ascites    Athlete's foot    CHF (congestive heart failure) (HCC)    Chronic systolic heart failure (HCC)    a. 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, unspecified CKD stage, unspecified whether long term insulin use (HCC)    Heart murmur    HTN (hypertension)    OSA (obstructive sleep apnea)    Past Surgical History:  Procedure Laterality Date   ABDOMINAL AORTOGRAM W/LOWER EXTREMITY N/A 05/19/2021   Procedure: ABDOMINAL AORTOGRAM W/LOWER EXTREMITY;  Surgeon: Cephus Shellinglark, Christopher J, MD;  Location: [facility] INVASIVE CV LAB;  Service: Cardiovascular;  Laterality: N/A;   AMPUTATION Left 05/22/2021   Procedure: LEFT FIFTH TOE AMPUTATION;  Surgeon: Cephus Shellinglark, Christopher J, MD;  [facility];  Service: Vascular;  Laterality: Left;   CARDIOVERSION N/A 06/04/2014   Procedure: CARDIOVERSION;  Surgeon: Laurey Moralealton S McLean, MD;  [facility];  Service: Cardiovascular;  Laterality: N/A;   NOSE SURGERY     Nasal septum surgery   PERIPHERAL VASCULAR INTERVENTION Left 05/19/2021   Procedure: PERIPHERAL VASCULAR INTERVENTION;  Surgeon: Cephus Shellinglark, Christopher J, MD;  Location: [facility] INVASIVE CV LAB;  Service: Cardiovascular;  Laterality: Left;   TEE WITHOUT CARDIOVERSION N/A 06/04/2014   Procedure: TRANSESOPHAGEAL ECHOCARDIOGRAM (TEE);  Surgeon:  Laurey Morale, MD;  Location[facility] ENDOSCOPY;  Service: Cardiovascular;  Laterality: N/A;   TEE WITHOUT CARDIOVERSION N/A 01/06/2016   Procedure: TRANSESOPHAGEAL ECHOCARDIOGRAM (TEE);  Surgeon: Pricilla Riffle, MD;  Location: [facility] ENDOSCOPY;  Service: Cardiovascular;  Laterality: N/A;   VASECTOMY      No Known Allergies  Outpatient Encounter  Medications as of 06/09/2021  Medication Sig   acetaminophen (TYLENOL) 500 MG tablet Take 1,000 mg by mouth every 6 (six) hours as needed.   Ascorbic Acid (VITAMIN C) 1000 MG tablet Take 1,000 mg by mouth daily.   atorvastatin (LIPITOR) 10 MG tablet Take 1 tablet (10 mg total) by mouth daily.   clopidogrel (PLAVIX) 75 MG tablet Take 1 tablet (75 mg total) by mouth daily with breakfast.   diclofenac Sodium (VOLTAREN) 1 % GEL Apply 2 g topically 4 (four) times daily.   feeding supplement, GLUCERNA SHAKE, (GLUCERNA SHAKE) LIQD Take 237 mLs by mouth 2 (two) times daily between meals.   furosemide (LASIX) 20 MG tablet Take 1 tablet (20 mg total) by mouth daily.   insulin glargine (LANTUS) 100 UNIT/ML injection Inject 0.08 mLs (8 Units total) into the skin daily.   losartan (COZAAR) 50 MG tablet Take 1 tablet (50 mg total) by mouth daily.   metoprolol succinate (TOPROL-XL) 100 MG 24 hr tablet Take 1 tablet (100 mg total) by mouth 2 (two) times daily.   vitamin B-12 1000 MCG tablet Take 1 tablet (1,000 mcg total) by mouth daily.   Vitamin D, Ergocalciferol, (DRISDOL) 1.25 MG (50000 UNIT) CAPS capsule Take 1 capsule (50,000 Units total) by mouth every Thursday.   warfarin (COUMADIN) 5 MG tablet Take 5 mg by mouth daily.   [DISCONTINUED] bisacodyl (DULCOLAX) 10 MG suppository Place 10 mg rectally as needed for moderate constipation. If not relieved by MOM, give 10 mg Bisacodyl suppositiory rectally X 1 dose in 24 hours as needed   [DISCONTINUED] hydrOXYzine (ATARAX/VISTARIL) 10 MG tablet Take 1 tablet (10 mg total) by mouth 3 (three) times daily as needed for anxiety.   [DISCONTINUED] Sodium Phosphates (RA SALINE ENEMA RE) Place 1 Dose rectally. If not relieved by Biscodyl suppository, give disposable Saline Enema rectally X 1 dose/24 hrs as needed   No facility-administered encounter medications on file as of 06/09/2021.    Review of Systems  GENERAL: No change in appetite, no fatigue, no weight changes,  no fever, chills or weakness MOUTH and THROAT: Denies oral discomfort, gingival pain or bleeding, RESPIRATORY: no cough, SOB, DOE, wheezing, hemoptysis CARDIAC: No chest pain, edema or palpitations GI: No abdominal pain, diarrhea, constipation, heart burn, nausea or vomiting GU: Denies dysuria, frequency, hematuria, incontinence, or discharge NEUROLOGICAL: Denies dizziness, syncope, numbness, or headache PSYCHIATRIC: Denies feelings of depression or anxiety. No report of hallucinations, insomnia, paranoia, or agitation   Immunization History  Administered Date(s) Administered   Tdap 05/01/2016   Pertinent  Health Maintenance Due  Topic Date Due   FOOT EXAM  Never done   OPHTHALMOLOGY EXAM  Never done   INFLUENZA VACCINE  06/09/2021   HEMOGLOBIN A1C  11/22/2021   COLONOSCOPY (Pts 45-39yrs Insurance coverage will need to be confirmed)  Discontinued   PNA vac Low Risk Adult  Discontinued   Fall Risk  06/13/2021  Falls in the past year? 0  Number falls in past yr: 0  Injury with Fall? 0  Risk for fall due to : No Fall Risks  Follow up Falls evaluation completed     Vitals:  06/09/21 1637  BP: (!) 156/68  Pulse: 84  Resp: 17  Temp: (!) 97.1 F (36.2 C)  Weight: 197 lb 6.4 oz (89.5 kg)  Height: 5\' 8"  (1.727 m)   Body mass index is 30.01 kg/m.  Physical Exam  GENERAL APPEARANCE: Well nourished. In no acute distress. Obese SKIN:  Left foot with dressing and wears Darco shoe MOUTH and THROAT: Lips are without lesions.  RESPIRATORY: Breathing is even & unlabored, BS CTAB CARDIAC: RRR, no murmur,no extra heart sounds, no edema GI: Abdomen soft, normal BS, no masses, no tenderness NEUROLOGICAL: There is no tremor. Speech is clear. Alert and oriented X 3. PSYCHIATRIC:  Affect and behavior are appropriate  Labs reviewed: Recent Labs    05/26/21 0606 05/27/21 0053 05/27/21 1357 05/28/21 0636 05/29/21 0034  NA 137  --  137 134*  --   K 4.7  --  5.2* 4.6  --   CL 104   --  99 101  --   CO2 28  --  31 26  --   GLUCOSE 165*  --  178* 190*  --   BUN 13  --  18 18  --   CREATININE 1.14   < > 1.17 1.00 1.17  CALCIUM 8.8*  --  9.3 9.0  --    < > = values in this interval not displayed.   Recent Labs    07/16/20 1044 05/15/21 1853  AST 18 16  ALT 25 23  ALKPHOS 29* 42  BILITOT 1.1 1.1  PROT 7.6 7.9  ALBUMIN 3.9 3.9   Recent Labs    05/15/21 1853 05/16/21 0310 05/26/21 0606 05/27/21 0053 05/28/21 0636  WBC 10.7*   < > 7.7 9.2 7.8  NEUTROABS 7.8*  --   --   --   --   HGB 13.2   < > 12.5* 11.7* 12.2*  HCT 40.2   < > 38.0* 36.4* 36.9*  MCV 89.1   < > 89.4 90.1 88.3  PLT 211   < > 228 254 246   < > = values in this interval not displayed.   Lab Results  Component Value Date   TSH 3.565 03/15/2016   Lab Results  Component Value Date   HGBA1C 10.4 (H) 05/22/2021   Lab Results  Component Value Date   CHOL 106 03/15/2016   HDL 16 (L) 03/15/2016   LDLCALC 75 03/15/2016   TRIG 77 03/15/2016   CHOLHDL 6.6 03/15/2016    Significant Diagnostic Results in last 30 days:  DG Knee 1-2 Views Right  Result Date: 05/21/2021 CLINICAL DATA:  Persistent right knee pain times multiple days EXAM: RIGHT KNEE - 1-2 VIEW COMPARISON:  None. FINDINGS: No evidence of fracture, dislocation, or joint effusion. Chondrocalcinosis. Tricompartment degenerative change worse in the medial tibiofemoral compartment where it is moderate. Vascular calcifications. IMPRESSION: 1. No acute osseous abnormality. 2. 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 Electronically Signed   By: 05/23/2021 MD   On: 05/21/2021 17:20   PERIPHERAL VASCULAR CATHETERIZATION  Result Date: 05/19/2021 Formatting of this result is different from the original. 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  Ultrasound-guided access of right common femoral artery 2.  Aortogram including catheter selection of aorta 3.  Bilateral lower extremity arteriogram with selection third order branches left lower extremity 4.  Left peroneal angioplasty (3 mm x 40 mm Sterling) 5.  Mynx closure of the right common femoral artery 6.  43 minutes of monitored moderate conscious sedation time   Indications: Patient is a 70 year old male who was seen in consultation over the weekend by Dr. Lenell Antu with left fifth toe gangrene and ischemic changes.  He had abnormal monophasic waveforms at the ankle.  He presents today for aortogram, left lower extremity arteriogram, and possible intervention after risk benefits discussed.   Findings: Aortogram showed patent renal arteries bilaterally with no flow-limiting stenosis in the aortoiliac segment.  The left lower extremity which is the side of interest showed a patent common femoral, profunda, SFA, above and below-knee popliteal artery without flow-limiting stenosis.  He has severe tibial disease with only peroneal runoff.  There was a 50% stenosis in the proximal peroneal, an 80% focal stenosis in the mid peroneal, and a short subtotal occlusion of the distal peroneal.  Th peroneal did reconstitute a PT at the ankle.  The peroneal lesion was then crossed and all lesions were angioplastied with 3 mm Sterling with excellent results and no residual stenosis.   Right lower extremity also shows patent common femoral, profunda, SFA, popliteal with tibial disease and only single peroneal runoff but also has a high-grade stenosis in the distal vessel reconstituting the posterior tibial             Procedure:  The patient was identified in the holding area and taken to room 8.  The patient was then placed supine on the table and prepped and draped in the usual sterile fashion.  A time out was  called.  Ultrasound was used to evaluate the right common femoral artery.  It was patent .  A digital ultrasound image was acquired.  A micropuncture needle was used to access the right common femoral artery under ultrasound guidance.  An 018 wire was advanced without resistance and a micropuncture sheath was placed.  The 018 wire was removed and a benson wire was placed.  The micropuncture sheath was exchanged for a 5 french sheath.  An omniflush catheter was advanced over the wire to the level of L-1.  An abdominal angiogram was obtained.  Next the catheter was pulled down and bilateral lower extremity runoff was obtained.  After evaluating images elected to intervene on the left peroneal artery.  I crossed the aortic bifurcation and placed my catheter into the left SFA where then placed a Rosen wire we exchanged for a long 5 French Ansell sheath in the right groin over the aortic bifurcation.  Patient was given 100 units/kg IV heparin.  I then used a long straight 100 cm flush catheter to get better dedicated images of the left peroneal artery.  We identified 3 lesions as noted above.  Lesions were then all crossed with a V 18 wire.  I then angioplastied each individual lesion with a 3 mm x 40 mm Sterling to nominal pressure for 2 minutes each.  There was no residual stenosis or dissection with a widely patent vessel.  Satisfied the results wires and catheters were removed.  I exchanged for a short 5 French sheath in the right groin.  Mynx closure device was deployed.     Plan: Patient will be loaded on Plavix.  He now has inline  flow and is optimized.   Cephus Shelling, MD [facility] Office: [phone]  DG Toe 5th Left  Result Date: 05/15/2021 CLINICAL DATA:  Pinky toe pain for approx 1 month. Decay and blackening on toe. Maggots removed today in ER. Hx: diabetes, gangrene EXAM: DG TOE 5TH LEFT COMPARISON:  None. FINDINGS: Cortical irregularity and mild periosteal  reaction of the proximal fifth phalanx. No definite cortical erosion or destruction. Question may be nondisplaced fracture. No definite acute displaced fracture or dislocation. Associated superimpose subcutaneus soft tissue edema and emphysema. IMPRESSION: 1. Cortical irregularity of the proximal fifth phalanx. No definite cortical erosion or destruction. Associated superimposed subcutaneus soft tissue edema and emphysema. Consider MRI foot with intravenous contrast (if GFR greater than 30) for further evaluation of possible osteomyelitis. 2. Question maybe nondisplaced fracture. Electronically Signed   By: Tish Frederickson M.D.   On: 05/15/2021 23:19   VAS [place] ABI WITH/WO TBI  Result Date: 05/17/2021  LOWER EXTREMITY DOPPLER STUDY Patient Name:  GABIREL FLETCHER  Date of Exam:   05/16/2021 Medical Rec #: [id number]          Accession #:    [id number] Date of Birth: 12/30/1950         Patient Gender: M Patient Age:   69Y Exam [facility]  [facility] Procedure:      VAS [place] ABI WITH/WO TBI Referring Phys: [id number] MARCUS V DUDA --------------------------------------------------------------------------------  Indications: Left fifth toe dry gangrene. High Risk Factors: Hypertension, Diabetes, current smoker.  Comparison Study: No prior study Performing Technologist: Gertie Fey MHA, RVT, RDCS, RDMS  Examination Guidelines: A complete evaluation includes at minimum, Doppler waveform signals and systolic blood pressure reading at the level of bilateral brachial, anterior tibial, and posterior tibial arteries, when vessel segments are accessible. Bilateral testing is considered an integral part of a complete examination. Photoelectric Plethysmograph (PPG) waveforms and toe systolic pressure readings are included as required and additional duplex testing as needed. Limited examinations for reoccurring indications may be performed as noted.  ABI Findings: +--------+------------------+-----+----------+--------+  Right   Rt Pressure (mmHg)IndexWaveform  Comment  +--------+------------------+-----+----------+--------+ [id number]                    triphasic          +--------+------------------+-----+----------+--------+ PTA     139               1.11 monophasic         +--------+------------------+-----+----------+--------+ DP      118               0.94 monophasic         +--------+------------------+-----+----------+--------+ +--------+------------------+-----+----------+-------+ Left    Lt Pressure (mmHg)IndexWaveform  Comment +--------+------------------+-----+----------+-------+ [id number]                    triphasic         +--------+------------------+-----+----------+-------+ PTA     100               0.80 monophasic        +--------+------------------+-----+----------+-------+ DP      84                0.67 monophasic        +--------+------------------+-----+----------+-------+ +-------+-----------+-----------+------------+------------+ ABI/TBIToday's ABIToday's TBIPrevious ABIPrevious TBI +-------+-----------+-----------+------------+------------+ Right  1.11       0.24                                +-------+-----------+-----------+------------+------------+  Left   0.80       0.21                                +-------+-----------+-----------+------------+------------+  Summary: Right: Resting right ankle-brachial index is within normal range, however given abnormal waveform this is likely falsely elevated due to medial arterial calcification. The right toe-brachial index is abnormal. Left: Resting left ankle-brachial index indicates mild left lower extremity arterial disease. The left toe-brachial index is abnormal.  *See table(s) above for measurements and observations.  Electronically signed by Heath Lark on 05/17/2021 at 4:30:05 PM.    Final    DG HIP UNILAT WITH PELVIS 2-3 VIEWS RIGHT  Result Date: 05/23/2021 CLINICAL DATA:  Chronic  right hip pain EXAM: DG HIP (WITH OR WITHOUT PELVIS) 2-3V RIGHT COMPARISON:  None. FINDINGS: Advanced degenerative change right hip with joint space narrowing, bony sclerosis, and cystic change in the femoral head and adjacent acetabulum. 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 Negative for fracture Negative left hip.  No pelvic bone lesion identified. 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 Electronically Signed   By: Marlan Palau M.D.   On: 05/23/2021 12:49    Assessment/Plan  1. Uncontrolled hypertension -  BPs elevated, will increase Amlodipine from 5 mg daily to 10 mg daily -   Continue metoprolol succinate ER 100 mg twice a day and losartan 50 mg daily -   Monitor BPs  2. Type 2 diabetes, uncontrolled, with renal manifestation (HCC) Lab Results  Component Value Date   HGBA1C 10.4 (H) 05/22/2021   -   CBGs stable, continue Lantus -   Monitor CBGs  3. 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 Chronic combined systolic and diastolic congestive heart failure (HCC) -  no SOB, continue furosemide     Family/ staff Communication:   Discussed plan of care with resident and charge nurse.  Labs/tests ordered:   None  Goals of care:   Short-term care   Kenard Gower, DNP, MSN, FNP-BC [facility] [phone] (Monday-Friday 8:00 a.m. - 5:00 p.m.) [phone] (after hours)

## 2021-06-10 DIAGNOSIS — H2523 Age-related cataract, morgagnian type, bilateral: Secondary | ICD-10-CM | POA: Diagnosis not present

## 2021-06-13 ENCOUNTER — Ambulatory Visit (INDEPENDENT_AMBULATORY_CARE_PROVIDER_SITE_OTHER): Payer: Medicare HMO | Admitting: Family

## 2021-06-13 ENCOUNTER — Other Ambulatory Visit (HOSPITAL_COMMUNITY): Payer: Self-pay

## 2021-06-13 ENCOUNTER — Other Ambulatory Visit: Payer: Self-pay

## 2021-06-13 ENCOUNTER — Ambulatory Visit: Payer: Medicare HMO | Admitting: Family

## 2021-06-13 ENCOUNTER — Encounter: Payer: Self-pay | Admitting: Family

## 2021-06-13 VITALS — BP 110/70 | HR 55 | Temp 97.1°F | Resp 16 | Ht 68.0 in | Wt 200.0 lb

## 2021-06-13 DIAGNOSIS — F321 Major depressive disorder, single episode, moderate: Secondary | ICD-10-CM

## 2021-06-13 DIAGNOSIS — IMO0002 Reserved for concepts with insufficient information to code with codable children: Secondary | ICD-10-CM

## 2021-06-13 DIAGNOSIS — E559 Vitamin D deficiency, unspecified: Secondary | ICD-10-CM

## 2021-06-13 DIAGNOSIS — G8929 Other chronic pain: Secondary | ICD-10-CM

## 2021-06-13 DIAGNOSIS — I130 Hypertensive heart and chronic kidney disease with heart failure and stage 1 through stage 4 chronic kidney disease, or unspecified chronic kidney disease: Secondary | ICD-10-CM

## 2021-06-13 DIAGNOSIS — M25561 Pain in right knee: Secondary | ICD-10-CM

## 2021-06-13 DIAGNOSIS — I5042 Chronic combined systolic (congestive) and diastolic (congestive) heart failure: Secondary | ICD-10-CM | POA: Diagnosis not present

## 2021-06-13 DIAGNOSIS — I480 Paroxysmal atrial fibrillation: Secondary | ICD-10-CM | POA: Diagnosis not present

## 2021-06-13 DIAGNOSIS — Z1159 Encounter for screening for other viral diseases: Secondary | ICD-10-CM | POA: Diagnosis not present

## 2021-06-13 DIAGNOSIS — E1165 Type 2 diabetes mellitus with hyperglycemia: Secondary | ICD-10-CM

## 2021-06-13 DIAGNOSIS — S98132D Complete traumatic amputation of one left lesser toe, subsequent encounter: Secondary | ICD-10-CM

## 2021-06-13 DIAGNOSIS — L602 Onychogryphosis: Secondary | ICD-10-CM

## 2021-06-13 DIAGNOSIS — L853 Xerosis cutis: Secondary | ICD-10-CM

## 2021-06-13 DIAGNOSIS — N183 Chronic kidney disease, stage 3 unspecified: Secondary | ICD-10-CM

## 2021-06-13 DIAGNOSIS — M25551 Pain in right hip: Secondary | ICD-10-CM | POA: Diagnosis not present

## 2021-06-13 DIAGNOSIS — E785 Hyperlipidemia, unspecified: Secondary | ICD-10-CM

## 2021-06-13 DIAGNOSIS — E1129 Type 2 diabetes mellitus with other diabetic kidney complication: Secondary | ICD-10-CM

## 2021-06-13 DIAGNOSIS — I5032 Chronic diastolic (congestive) heart failure: Secondary | ICD-10-CM

## 2021-06-13 MED ORDER — AQUAPHOR EX OINT
TOPICAL_OINTMENT | Freq: Two times a day (BID) | CUTANEOUS | 0 refills | Status: DC
Start: 2021-06-13 — End: 2021-06-19
  Filled 2021-06-13: qty 420, fill #0

## 2021-06-13 MED ORDER — SERTRALINE HCL 25 MG PO TABS
25.0000 mg | ORAL_TABLET | Freq: Every day | ORAL | 0 refills | Status: DC
  Filled 2021-06-13: qty 30, 30d supply, fill #0

## 2021-06-13 NOTE — Progress Notes (Signed)
Provider: [name] FNP-C   Patient, No Pcp Per (Inactive)  Patient Care Team: Patient, No Pcp Per (Inactive) as PCP - General (General Practice) Bensimhon, Shaune Pascal, MD as PCP - Advanced Heart Failure (Cardiology) Troy Sine, MD as Consulting Physician (Cardiology)  Extended Emergency Contact Information Primary Emergency Contact: [place] Mobile Phone: [phone] Relation: Daughter Secondary Emergency Contact: [place] Phone: [phone] Mobile Phone: [phone] Relation: Other  Code Status:  Full Code  Goals of care: Advanced Directive information Advanced Directives 06/13/2021  Does Patient Have a Medical Advance Directive? No  Would patient like information on creating a medical advance directive? No - Patient declined  Pre-existing out of facility DNR order (yellow form or pink MOST form) -  Some encounter information is confidential and restricted. Go to Review Flowsheets activity to see all data.     Chief Complaint  Patient presents with   Establish Care    New Patient.    HPI:  Pt is a 70 y.o. 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 States both Feet feel numb since surgery.No pain.Status post left 5th toe amputation as above. Continues to work  with Physical and occupation therapy. Daughter states patient does not like being in the facility.Has felt lonely.Daughter visits every day. HPI information limited patient some times differs with daughter and sometimes hums and chant poetically with eyes closed during visit unable to answer questions appropriately but opens eyes at times and communicates appropriately.   Type 2 DM - reports CBG readings in the 150 's -160's.follows up with Ophthalmology and he is scheduled for Cataract surgery in 3 weeks.gets eye injection monthly. States vision is 20/400 and 20/800 unclear which eye.   Hypertension - blood was high yesterday but within normal range today.denies any headache,dizziness,vision changes,fatigue,chest tightness,palpitation,chest pain or shortness of breath.   States has changes his diet eating more healthier.   Right knee - he rates pain 6/10 on scale   Right hip pain - limb ing more since boot was taken off.pain worst when lying on the side too long. Wearing Darco shoe post left fifth toe amputation.   Afib - denies any palpitation,fatigue,chest pain or shortness of breath.Follows up with coumadin clinic for INR check   He is due for COVID-19,PNA and Zoster vaccines but got upset when vaccine were discussed during visit states " Again these are government mandates trying to control [place] ". States will not receive any vaccine.Also declines any referral for colonoscopy. Daughter also in agreement of no vaccines.   Depression - agrees to feeling depressed and does not want to live anymore.denies any thoughts of suicide,harm to self or others.use of antidepressant discussed and would like to try.    Past Medical History:  Diagnosis Date   A-fib [facility])    a. (06/04/14) TEE-DC-CV; succesful; large LA 6.2 cm   ADHD    Ascites    Athlete's foot    CHF (congestive  heart failure) ([place])    Chronic systolic heart failure ([place])    a. ECHO (05/2014): EF 25-30%, diff HK, RV midly dilated and  sys fx mildly/mod reduced   CKD (chronic kidney disease) stage 3, GFR 30-59 ml/min (HCC)    Depression    Diabetes mellitus due to underlying condition with diabetic chronic kidney disease, unspecified CKD stage, unspecified whether long term insulin use (HCC)    Heart murmur    HTN (hypertension)    OSA (obstructive sleep apnea)    Past Surgical History:  Procedure Laterality Date   ABDOMINAL AORTOGRAM W/LOWER EXTREMITY N/A 05/19/2021   Procedure: ABDOMINAL AORTOGRAM W/LOWER EXTREMITY;  Surgeon: Marty Heck, MD;  Location: [place] [facility];  Service: Cardiovascular;  Laterality: N/A;   AMPUTATION Left 05/22/2021   Procedure: LEFT FIFTH TOE AMPUTATION;  Surgeon: Marty Heck, MD;  Location: [place];  Service: Vascular;  Laterality: Left;   CARDIOVERSION N/A 06/04/2014   Procedure: CARDIOVERSION;  Surgeon: Larey Dresser, MD;  Location: [place];  Service: Cardiovascular;  Laterality: N/A;   NOSE SURGERY     Nasal septum surgery   PERIPHERAL VASCULAR INTERVENTION Left 05/19/2021   Procedure: PERIPHERAL VASCULAR INTERVENTION;  Surgeon: Marty Heck, MD;  Location: [place] [facility];  Service: Cardiovascular;  Laterality: Left;   TEE WITHOUT CARDIOVERSION N/A 06/04/2014   Procedure: TRANSESOPHAGEAL ECHOCARDIOGRAM (TEE);  Surgeon: Larey Dresser, MD;  Location: [place];  Service: Cardiovascular;  Laterality: N/A;   TEE WITHOUT CARDIOVERSION N/A 01/06/2016   Procedure: TRANSESOPHAGEAL ECHOCARDIOGRAM (TEE);  Surgeon: Fay Records, MD;  Location: [facility];  Service: Cardiovascular;  Laterality: N/A;   VASECTOMY      No Known Allergies  Allergies as of 06/13/2021   No Known Allergies      Medication List        Accurate as of June 13, 2021  8:43 AM. If you have any questions, ask your nurse or doctor.          STOP taking these medications    bisacodyl 10 MG suppository Commonly known as: DULCOLAX Stopped by: Sandrea Hughs, NP    hydrOXYzine 10 MG tablet Commonly known as: ATARAX/VISTARIL Stopped by: Sandrea Hughs, NP   RA SALINE ENEMA RE Stopped by: Sandrea Hughs, NP       TAKE these medications    acetaminophen 500 MG tablet Commonly known as: TYLENOL Take 1,000 mg by mouth every 6 (six) hours as needed.   atorvastatin 10 MG tablet Commonly known as: LIPITOR Take 1 tablet (10 mg total) by mouth daily.   clopidogrel 75 MG tablet Commonly known as: PLAVIX Take 1 tablet (75 mg total) by mouth daily with breakfast.   cyanocobalamin 1000 MCG tablet Take 1 tablet (1,000 mcg total) by mouth daily.   diclofenac Sodium 1 % Gel Commonly known as: VOLTAREN Apply 2 g topically 4 (four) times daily.   feeding supplement (GLUCERNA SHAKE) Liqd Take 237 mLs by mouth 2 (two) times daily between meals.   furosemide 20 MG tablet Commonly known as: Lasix Take 1 tablet (20 mg total) by mouth daily.   insulin glargine 100 UNIT/ML injection Commonly known as: LANTUS Inject 0.08 mLs (8 Units total) into the skin daily.   losartan 50 MG tablet Commonly known as: COZAAR Take 1 tablet (50 mg total) by mouth daily.   metoprolol succinate 100 MG 24 hr tablet Commonly known as: TOPROL-XL Take 1 tablet (100 mg total) by mouth 2 (  two) times daily.   vitamin C 1000 MG tablet Take 1,000 mg by mouth daily.   Vitamin D (Ergocalciferol) 1.25 MG (50000 UNIT) Caps capsule Commonly known as: DRISDOL Take 1 capsule (50,000 Units total) by mouth every Thursday.   warfarin 5 MG tablet Commonly known as: COUMADIN Take as directed by the anticoagulation clinic. If you are unsure how to take this medication, talk to your nurse or doctor. Original instructions: Take 5 mg by mouth daily.        Review of Systems  Constitutional:  Negative for appetite change, chills, fatigue, fever and unexpected weight change.  HENT:  Negative for congestion, dental problem, ear discharge, ear pain, facial swelling, hearing loss,  nosebleeds, postnasal drip, rhinorrhea, sinus pressure, sinus pain, sneezing, sore throat, tinnitus and trouble swallowing.   Eyes:  Negative for pain, discharge, redness, itching and visual disturbance.  Respiratory:  Negative for cough, chest tightness, shortness of breath and wheezing.   Cardiovascular:  Negative for chest pain, palpitations and leg swelling.  Gastrointestinal:  Negative for abdominal distention, abdominal pain, blood in stool, constipation, diarrhea, nausea and vomiting.  Endocrine: Negative for cold intolerance, heat intolerance, polydipsia, polyphagia and polyuria.  Genitourinary:  Negative for difficulty urinating, dysuria, flank pain, frequency and urgency.  Musculoskeletal:  Positive for arthralgias and gait problem. Negative for back pain, joint swelling, myalgias, neck pain and neck stiffness.  Skin:  Negative for color change, pallor, rash and wound.  Neurological:  Negative for dizziness, syncope, speech difficulty, weakness, light-headedness, numbness and headaches.  Hematological:  Does not bruise/bleed easily.  Psychiatric/Behavioral:  Negative for agitation, behavioral problems, confusion, hallucinations, self-injury, sleep disturbance and suicidal ideas. The patient is not nervous/anxious.    Immunization History  Administered Date(s) Administered   Tdap 05/01/2016   Pertinent  Health Maintenance Due  Topic Date Due   FOOT EXAM  Never done   OPHTHALMOLOGY EXAM  Never done   COLONOSCOPY (Pts 45-62yr Insurance coverage will need to be confirmed)  Never done   PNA vac Low Risk Adult (1 of 2 - PCV13) Never done   INFLUENZA VACCINE  06/09/2021   HEMOGLOBIN A1C  11/22/2021   Fall Risk  06/13/2021  Falls in the past year? 0  Number falls in past yr: 0  Injury with Fall? 0  Risk for fall due to : No Fall Risks  Follow up Falls evaluation completed   Functional Status Survey:    Vitals:   06/13/21 0840  BP: 110/70  Pulse: (!) 55  Resp: 16  Temp: (!)  97.1 F (36.2 C)  SpO2: 97%  Weight: 200 lb (90.7 kg)  Height: '5\' 8"'  (1.727 m)   Body mass index is 30.41 kg/m. Physical Exam Vitals reviewed.  Constitutional:      General: He is not in acute distress.    Appearance: Normal appearance. He is obese. He is not ill-appearing or diaphoretic.  HENT:     Head: Normocephalic.     Right Ear: Tympanic membrane, ear canal and external ear normal. There is no impacted cerumen.     Left Ear: Tympanic membrane, ear canal and external ear normal. There is no impacted cerumen.     Nose: Nose normal. No congestion or rhinorrhea.     Mouth/Throat:     Mouth: Mucous membranes are moist.     Pharynx: Oropharynx is clear. No oropharyngeal exudate or posterior oropharyngeal erythema.  Eyes:     General: No scleral icterus.  Right eye: No discharge.        Left eye: No discharge.     Extraocular Movements: Extraocular movements intact.     Conjunctiva/sclera: Conjunctivae normal.     Pupils: Pupils are equal, round, and reactive to light.  Neck:     Vascular: No carotid bruit.  Cardiovascular:     Rate and Rhythm: Normal rate and regular rhythm.     Pulses: Normal pulses.     Heart sounds: Murmur heard.    No friction rub. No gallop.  Pulmonary:     Effort: Pulmonary effort is normal. No respiratory distress.     Breath sounds: Normal breath sounds. No wheezing, rhonchi or rales.  Chest:     Chest wall: No tenderness.  Abdominal:     General: Bowel sounds are normal. There is no distension.     Palpations: Abdomen is soft. There is no mass.     Tenderness: There is no abdominal tenderness. There is no right CVA tenderness, left CVA tenderness, guarding or rebound.  Musculoskeletal:        General: No swelling or tenderness. Normal range of motion.     Cervical back: Normal range of motion. No rigidity or tenderness.     Right lower leg: No edema.     Left lower leg: No edema.       Feet:     Comments: Unsteady gait ambulates with  Rolling walker  Darco shoe on left foot   Feet:     Right foot:     Toenail Condition: Right toenails are long.     Left foot:     Toenail Condition: Left toenails are long.  Lymphadenopathy:     Cervical: No cervical adenopathy.  Skin:    General: Skin is warm and dry.     Coloration: Skin is not pale.     Findings: No bruising, erythema, lesion or rash.     Comments: Left 5th toe amputee with sutures in place dry,clean and intact no signs of infection.  Lower extremities skin dry and scaly.  Neurological:     Mental Status: He is alert and oriented to person, place, and time.     Cranial Nerves: No cranial nerve deficit.     Sensory: No sensory deficit.     Motor: No weakness.     Coordination: Coordination normal.     Gait: Gait abnormal.  Psychiatric:        Mood and Affect: Mood is depressed.        Speech: Speech normal.        Behavior: Behavior normal.        Thought Content: Thought content normal.        Judgment: Judgment is inappropriate.     Comments: hums and chant poetically with eyes closed during visit unable to answer questions appropriately but opens eyes at times and communicates appropriately.    Labs reviewed: Recent Labs    05/26/21 0606 05/27/21 0053 05/27/21 1357 05/28/21 0636 05/29/21 0034  NA 137  --  137 134*  --   K 4.7  --  5.2* 4.6  --   CL 104  --  99 101  --   CO2 28  --  31 26  --   GLUCOSE 165*  --  178* 190*  --   BUN 13  --  18 18  --   CREATININE 1.14   < > 1.17 1.00 1.17  CALCIUM 8.8*  --  9.3 9.0  --    < > = values in this interval not displayed.   Recent Labs    07/16/20 1044 05/15/21 1853  AST 18 16  ALT 25 23  ALKPHOS 29* 42  BILITOT 1.1 1.1  PROT 7.6 7.9  ALBUMIN 3.9 3.9   Recent Labs    05/15/21 1853 05/16/21 0310 05/26/21 0606 05/27/21 0053 05/28/21 0636  WBC 10.7*   < > 7.7 9.2 7.8  NEUTROABS 7.8*  --   --   --   --   HGB 13.2   < > 12.5* 11.7* 12.2*  HCT 40.2   < > 38.0* 36.4* 36.9*  MCV 89.1   < >  89.4 90.1 88.3  PLT 211   < > 228 254 246   < > = values in this interval not displayed.   Lab Results  Component Value Date   TSH 3.565 03/15/2016   Lab Results  Component Value Date   HGBA1C 10.4 (H) 05/22/2021   Lab Results  Component Value Date   CHOL 106 03/15/2016   HDL 16 (L) 03/15/2016   LDLCALC 75 03/15/2016   TRIG 77 03/15/2016   CHOLHDL 6.6 03/15/2016    Significant Diagnostic Results in last 30 days:  DG Knee 1-2 Views Right  Result Date: 05/21/2021 CLINICAL DATA:  Persistent right knee pain times multiple days EXAM: RIGHT KNEE - 1-2 VIEW COMPARISON:  None. FINDINGS: No evidence of fracture, dislocation, or joint effusion. Chondrocalcinosis. Tricompartment degenerative change worse in the medial tibiofemoral compartment where it is moderate. Vascular calcifications. IMPRESSION: 1. No acute osseous abnormality. 2. 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 Electronically Signed   By: Dahlia Bailiff MD   On: 05/21/2021 17:20   PERIPHERAL VASCULAR CATHETERIZATION  Result Date: 05/19/2021 Formatting of this result is different from the original. 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  Ultrasound-guided access of right common femoral artery 2.  Aortogram including catheter selection of aorta 3.  Bilateral lower extremity arteriogram with selection third order branches left lower extremity 4.  Left peroneal angioplasty (3 mm x 40 mm Sterling) 5.  Mynx closure of the right common femoral artery 6.  43 minutes of monitored moderate conscious sedation time   Indications: Patient is a 71 year old male who was seen in consultation over the weekend by Dr. Stanford Breed with left fifth toe  gangrene and ischemic changes.  He had abnormal monophasic waveforms at the ankle.  He presents today for aortogram, left lower extremity arteriogram, and possible intervention after risk benefits discussed.   Findings: Aortogram showed patent renal arteries bilaterally with no flow-limiting stenosis in the aortoiliac segment.  The left lower extremity which is the side of interest showed a patent common femoral, profunda, SFA, above and below-knee popliteal artery without flow-limiting stenosis.  He has severe tibial disease with only peroneal runoff.  There was a 50% stenosis in the proximal peroneal, an 80% focal stenosis in the mid peroneal, and a short subtotal occlusion of the distal peroneal.  Th peroneal did reconstitute a PT at the ankle.  The peroneal lesion was then crossed and all lesions were angioplastied with 3 mm  Sterling with excellent results and no residual stenosis.   Right lower extremity also shows patent common femoral, profunda, SFA, popliteal with tibial disease and only single peroneal runoff but also has a high-grade stenosis in the distal vessel reconstituting the posterior tibial             Procedure:  The patient was identified in the holding area and taken to room 8.  The patient was then placed supine on the table and prepped and draped in the usual sterile fashion.  A time out was called.  Ultrasound was used to evaluate the right common femoral artery.  It was patent .  A digital ultrasound image was acquired.  A micropuncture needle was used to access the right common femoral artery under ultrasound guidance.  An 018 wire was advanced without resistance and a micropuncture sheath was placed.  The 018 wire was removed and a benson wire was placed.  The micropuncture sheath was exchanged for a 5 french sheath.  An omniflush catheter was advanced over the wire to the level of L-1.  An abdominal angiogram was obtained.  Next the catheter was pulled down and bilateral lower extremity  runoff was obtained.  After evaluating images elected to intervene on the left peroneal artery.  I crossed the aortic bifurcation and placed my catheter into the left SFA where then placed a Rosen wire we exchanged for a long 5 French Ansell sheath in the right groin over the aortic bifurcation.  Patient was given 100 units/kg IV heparin.  I then used a long straight 100 cm flush catheter to get better dedicated images of the left peroneal artery.  We identified 3 lesions as noted above.  Lesions were then all crossed with a V 18 wire.  I then angioplastied each individual lesion with a 3 mm x 40 mm Sterling to nominal pressure for 2 minutes each.  There was no residual stenosis or dissection with a widely patent vessel.  Satisfied the results wires and catheters were removed.  I exchanged for a short 5 French sheath in the right groin.  Mynx closure device was deployed.     Plan: Patient will be loaded on Plavix.  He now has inline flow and is optimized.   Marty Heck, MD [facility] Office: [phone]  DG Toe 5th Left  Result Date: 05/15/2021 CLINICAL DATA:  Pinky toe pain for approx 1 month. Decay and blackening on toe. Maggots removed today in ER. Hx: diabetes, gangrene EXAM: DG TOE 5TH LEFT COMPARISON:  None. FINDINGS: Cortical irregularity and mild periosteal reaction of the proximal fifth phalanx. No definite cortical erosion or destruction. Question may be nondisplaced fracture. No definite acute displaced fracture or dislocation. Associated superimpose subcutaneus soft tissue edema and emphysema. IMPRESSION: 1. Cortical irregularity of the proximal fifth phalanx. No definite cortical erosion or destruction. Associated superimposed subcutaneus soft tissue edema and emphysema. Consider MRI foot with intravenous contrast (if GFR greater than 30) for further evaluation of possible osteomyelitis. 2. Question maybe nondisplaced fracture. Electronically Signed   By:  Iven Finn M.D.   On: 05/15/2021 23:19   VAS [place] ABI WITH/WO TBI  Result Date: 05/17/2021  LOWER EXTREMITY DOPPLER STUDY Patient Name:  LONNELL CHAPUT  Date of Exam:   05/16/2021 Medical Rec #: [id number]          Accession #:    [id number] Date of Birth: October 14, 1951         Patient Gender:  M Patient Age:   89Y Exam Location:  [facility] Procedure:      VAS [place] ABI WITH/WO TBI Referring Phys: [place] --------------------------------------------------------------------------------  Indications: Left fifth toe dry gangrene. High Risk Factors: Hypertension, Diabetes, current smoker.  Comparison Study: No prior study Performing Technologist: Maudry Mayhew MHA, RVT, RDCS, RDMS  Examination Guidelines: A complete evaluation includes at minimum, Doppler waveform signals and systolic blood pressure reading at the level of bilateral brachial, anterior tibial, and posterior tibial arteries, when vessel segments are accessible. Bilateral testing is considered an integral part of a complete examination. Photoelectric Plethysmograph (PPG) waveforms and toe systolic pressure readings are included as required and additional duplex testing as needed. Limited examinations for reoccurring indications may be performed as noted.  ABI Findings: +--------+------------------+-----+----------+--------+ Right   Rt Pressure (mmHg)IndexWaveform  Comment  +--------+------------------+-----+----------+--------+ [id number]                    triphasic          +--------+------------------+-----+----------+--------+ PTA     139               1.11 monophasic         +--------+------------------+-----+----------+--------+ DP      118               0.94 monophasic         +--------+------------------+-----+----------+--------+ +--------+------------------+-----+----------+-------+ Left    Lt Pressure (mmHg)IndexWaveform  Comment +--------+------------------+-----+----------+-------+  [id number]                    triphasic         +--------+------------------+-----+----------+-------+ PTA     100               0.80 monophasic        +--------+------------------+-----+----------+-------+ DP      84                0.67 monophasic        +--------+------------------+-----+----------+-------+ +-------+-----------+-----------+------------+------------+ ABI/TBIToday's ABIToday's TBIPrevious ABIPrevious TBI +-------+-----------+-----------+------------+------------+ Right  1.11       0.24                                +-------+-----------+-----------+------------+------------+ Left   0.80       0.21                                +-------+-----------+-----------+------------+------------+  Summary: Right: Resting right ankle-brachial index is within normal range, however given abnormal waveform this is likely falsely elevated due to medial arterial calcification. The right toe-brachial index is abnormal. Left: Resting left ankle-brachial index indicates mild left lower extremity arterial disease. The left toe-brachial index is abnormal.  *See table(s) above for measurements and observations.  Electronically signed by Jamelle Haring on 05/17/2021 at 4:30:05 PM.    Final    DG HIP UNILAT WITH PELVIS 2-3 VIEWS RIGHT  Result Date: 05/23/2021 CLINICAL DATA:  Chronic right hip pain EXAM: DG HIP (WITH OR WITHOUT PELVIS) 2-3V RIGHT COMPARISON:  None. FINDINGS: Advanced degenerative change right hip with joint space narrowing, bony sclerosis, and cystic change in the femoral head and adjacent acetabulum. 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 Negative for fracture Negative left hip.  No pelvic bone lesion identified. 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 Electronically Signed   By:  Franchot Gallo M.D.   On: 05/23/2021 12:49    Assessment/Plan  1. Type 2 diabetes, uncontrolled, with renal manifestation (HCC) Lab  Results  Component Value Date   HGBA1C 10.4 (H) 05/22/2021  Reports CBG in the 150's-160's - continue on Lantus  - on statin and anticoagulant  -will refer to podiatrist for overgrown toenails - Hemoglobin A1c - Ambulatory referral to Podiatry  2. PAF (paroxysmal atrial fibrillation) (HCC) HR controlled  - continue on coumadin for anticoagulation.continue to follow up with coumadin clinic  - continue on metoprolol succinate  for rate controlled - CBC with Differential/Platelet  3. Chronic combined systolic and diastolic congestive heart failure (HCC) No signs of fluid overload  - continue on furosemide   4. Hypertensive heart and kidney disease with chronic diastolic congestive heart failure and stage 3 chronic kidney disease, unspecified whether stage 3a or 3b CKD (HCC) B/p at goal today. - continue on losartan,metoprolol succinate and furosemide  - CBC with Differential/Platelet - CMP with eGFR([facility]) - TSH - Lipid Panel  5. Encounter for hepatitis C screening test for low risk patient Reports no high risk behaviors or blood transfusion - Hep C Antibody  6. Chronic right hip pain Continue on Tylenol - request referral to Orthopedic  - Ambulatory referral to Orthopedic Surgery  7. Chronic pain of right knee Worsening  - continue on Tylenol and Voltaren gel  Refer to Orthopedic  - Ambulatory referral to Orthopedic Surgery  8. Current moderate episode of major depressive disorder, unspecified whether recurrent ([place]) Admits to feeling depressed and does not want to live anymore.No suicide ideation /harm to self or others.willing to start on antidepressant  Start on sertraline as below SE discuss  - advised to follow up in one month for evaluation - sertraline (ZOLOFT) 25 MG tablet; Take 1 tablet (25 mg total) by mouth daily.  Dispense: 30 tablet; Refill: 0  9. Overgrown toenails Bilateral thick and overgrown toenails  Will refer to podiatrist to trim toenails -  Ambulatory referral to Podiatry  10. Dry skin dermatitis Advised to apply Aquaphor ointment or any other skin moisturizer twice daily.  - mineral oil-hydrophilic petrolatum (AQUAPHOR) ointment; Apply topically 2 (two) times daily. Legs  Dispense: 420 g; Refill: 0  11. Vitamin D deficiency Continue on vitamin D supplement   12. Hyperlipidemia LDL goal <70 No recent LDL for review. - continue on atorvastatin  - Lipid Panel  13. 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 - no pain reported.tylenol as needed for pain  - foam dressing in place - follow up with surgery as directed   Family/ staff Communication: Reviewed plan of care with patient and daughter [name] verbalized understanding   Labs/tests ordered: Fasting Labs prior to visit today has not eaten anything since mid-night.  - CBC with Differential/Platelet - CMP with eGFR([facility]) - TSH - Hgb A1C - Lipid panel - Hep C Antibody  Next Appointment : one months for follow up depression and 6 months for medical management of chronic issues.  Time spent with patient 45 minutes >50% time spent counseling; reviewing medical record; tests; labs; and developing future plan of care  Sandrea Hughs, NP

## 2021-06-16 DIAGNOSIS — H34811 Central retinal vein occlusion, right eye, with macular edema: Secondary | ICD-10-CM | POA: Diagnosis not present

## 2021-06-19 ENCOUNTER — Encounter: Payer: Self-pay | Admitting: Internal Medicine

## 2021-06-19 ENCOUNTER — Other Ambulatory Visit (HOSPITAL_COMMUNITY): Payer: Self-pay

## 2021-06-19 ENCOUNTER — Non-Acute Institutional Stay (SKILLED_NURSING_FACILITY): Payer: Medicare HMO | Admitting: Internal Medicine

## 2021-06-19 DIAGNOSIS — R29818 Other symptoms and signs involving the nervous system: Secondary | ICD-10-CM | POA: Diagnosis not present

## 2021-06-19 DIAGNOSIS — E1129 Type 2 diabetes mellitus with other diabetic kidney complication: Secondary | ICD-10-CM

## 2021-06-19 DIAGNOSIS — R4189 Other symptoms and signs involving cognitive functions and awareness: Secondary | ICD-10-CM

## 2021-06-19 DIAGNOSIS — R768 Other specified abnormal immunological findings in serum: Secondary | ICD-10-CM

## 2021-06-19 DIAGNOSIS — IMO0002 Reserved for concepts with insufficient information to code with codable children: Secondary | ICD-10-CM

## 2021-06-19 DIAGNOSIS — N1832 Chronic kidney disease, stage 3b: Secondary | ICD-10-CM | POA: Diagnosis not present

## 2021-06-19 DIAGNOSIS — E1165 Type 2 diabetes mellitus with hyperglycemia: Secondary | ICD-10-CM

## 2021-06-19 DIAGNOSIS — I10 Essential (primary) hypertension: Secondary | ICD-10-CM | POA: Diagnosis not present

## 2021-06-19 LAB — COMPLETE METABOLIC PANEL WITH GFR
AG Ratio: 1.1 (calc) (ref 1.0–2.5)
ALT: 38 U/L (ref 9–46)
AST: 19 U/L (ref 10–35)
Albumin: 3.9 g/dL (ref 3.6–5.1)
Alkaline phosphatase (APISO): 36 U/L (ref 35–144)
BUN: 19 mg/dL (ref 7–25)
CO2: 28 mmol/L (ref 20–32)
Calcium: 9.2 mg/dL (ref 8.6–10.3)
Chloride: 102 mmol/L (ref 98–110)
Creat: 1.18 mg/dL (ref 0.70–1.28)
Globulin: 3.4 g/dL (calc) (ref 1.9–3.7)
Glucose, Bld: 183 mg/dL — ABNORMAL HIGH (ref 65–99)
Potassium: 4.3 mmol/L (ref 3.5–5.3)
Sodium: 139 mmol/L (ref 135–146)
Total Bilirubin: 0.8 mg/dL (ref 0.2–1.2)
Total Protein: 7.3 g/dL (ref 6.1–8.1)
eGFR: 66 mL/min/{1.73_m2} (ref >=60)

## 2021-06-19 LAB — TSH: TSH: 2.01 mIU/L (ref 0.40–4.50)

## 2021-06-19 LAB — CBC WITH DIFFERENTIAL/PLATELET
Absolute Monocytes: 502 cells/uL (ref 200–950)
Basophils Absolute: 73 cells/uL (ref 0–200)
Basophils Relative: 0.9 %
Eosinophils Absolute: 211 cells/uL (ref 15–500)
Eosinophils Relative: 2.6 %
HCT: 44.8 % (ref 38.5–50.0)
Hemoglobin: 14.7 g/dL (ref 13.2–17.1)
Lymphs Abs: 1434 cells/uL (ref 850–3900)
MCH: 29.1 pg (ref 27.0–33.0)
MCHC: 32.8 g/dL (ref 32.0–36.0)
MCV: 88.7 fL (ref 80.0–100.0)
MPV: 11.3 fL (ref 7.5–12.5)
Monocytes Relative: 6.2 %
Neutro Abs: 5881 cells/uL (ref 1500–7800)
Neutrophils Relative %: 72.6 %
Platelets: 261 10*3/uL (ref 140–400)
RBC: 5.05 10*6/uL (ref 4.20–5.80)
RDW: 13.7 % (ref 11.0–15.0)
Total Lymphocyte: 17.7 %
WBC: 8.1 10*3/uL (ref 3.8–10.8)

## 2021-06-19 LAB — HCV RNA,QUANTITATIVE REAL TIME PCR
HCV Quantitative Log: <1.18 Log IU/mL — AB
HCV RNA, PCR, QN: <15 IU/mL — AB

## 2021-06-19 LAB — LIPID PANEL
Cholesterol: 140 mg/dL (ref <200)
HDL: 33 mg/dL — ABNORMAL LOW (ref >=40)
LDL Cholesterol (Calc): 80 mg/dL (calc)
Non-HDL Cholesterol (Calc): 107 mg/dL (calc) (ref <130)
Total CHOL/HDL Ratio: 4.2 (calc) (ref <5.0)
Triglycerides: 176 mg/dL — ABNORMAL HIGH (ref <150)

## 2021-06-19 LAB — HEMOGLOBIN A1C
Hgb A1c MFr Bld: 8.2 % of total Hgb — ABNORMAL HIGH (ref <5.7)
Mean Plasma Glucose: 189 mg/dL
eAG (mmol/L): 10.4 mmol/L

## 2021-06-19 LAB — HEPATITIS C ANTIBODY
Hepatitis C Ab: REACTIVE — AB
SIGNAL TO CUT-OFF: 1.16 — ABNORMAL HIGH (ref <1.00)

## 2021-06-19 NOTE — Assessment & Plan Note (Signed)
This will be addressed by his new PCP post discharge from SNF.

## 2021-06-19 NOTE — Assessment & Plan Note (Addendum)
Current A1c is 10.4% indicating extremely poorly controlled diabetes.  Prior to recent admission he was on no diabetic medications.  He is presently on Lantus 8 units daily.

## 2021-06-19 NOTE — Assessment & Plan Note (Signed)
Blood pressure remains poorly controlled with an average in the range of 150+/90+ despite amlodipine, metoprolol, and losartan.  I will add lower dose rate controlling CCB to see if better control can be achieved.  In the future possibly the vasodilating CCB can be discontinued and the rate controlling agent titrated up if needed.

## 2021-06-19 NOTE — Patient Instructions (Addendum)
See assessment and plan under each diagnosis in the problem list and acutely for this visit Prior to admission to the hospital 05/15/2021 he was on extended release metoprolol 100 mg daily,atorvastatin , warfarin  (5 mg Monday, Tuesday, Thursday, Friday, Saturday, Sunday and 4 mg only on Wednesday), and furosemide as per Cardiology according to his memory. Since that prolonged hospitalization he has been on Lantus 8 units daily, Plavix 75 mg daily, amlodipine 10 mg daily, and losartan 50 mg daily. He states he plans to get his prescriptions from [facility] mail order.  He would need a short-term prescription at local pharmacy.  I will ask him to check with his insurance company and see which insurance pharmacy to use.  Additionally on 8/11 I initiated a rate controlling CCB because of tachycardia and persistent hypertension. Social Services to give daughter copy of AHT Med List & SLUMS MMSE.

## 2021-06-19 NOTE — Progress Notes (Signed)
NURSING HOME LOCATION:  [facility] ROOM NUMBER:  [id number]  CODE STATUS:  Full Code  PCP:  Richarda Blade NP  The patient is tentatively being discharged from [facility] within the next few days 8/14 by Marga Melnick MD.  PCP: Patient was hospitalized 7/7 - 05/29/2021 presenting from home with complaints of black discoloration of the left fifth toe.  On exam necrosis was present as were maggots.  Temperature was 100.3.  Arteriogram was performed 7/10 and multiple lesions were angioplastied.  [facility] amputated the toe 7/14.  Postop heel weightbearing with Darco was recommended.  Antibiotics included IV cefepime and Flagyl x9 days.  Following surgery Plavix was initiated for the PVD. Initially Lasix was held and IV fluids administered for elevated creatinine.  Renal function improved with these interventions.   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  On August 13, 2020 ,EF was 55-60% with absence of any thrombus in the left ventricle. Warfarin was reinitiated because of PAF in the context of that prior thrombus . Lantus was initiated for poorly controlled diabetes as documented by an A1c of 10.4%.  Summary of [facility] medical records: I interviewed PT/OT, and ST staff as to his progress.  They state that he is able to walk from his room to their department and back to his room employing a rolling walker.  This is approximately a total of 270 feet.  They feel that he is very unenthusiastic about his physical therapy.  They believe that he would be able to perform at this level at home. He went to establish with his new PCP 06/13/2021 with his daughter. Hepatitis C antibody screening was positive.  The PCP notified me that he was to be discharged in the near future.   Social Services here at the facility have not received notice from [place]. Today he can not define a discharge date. Blood pressure here has ranged  in the 150/85-95 range despite taking losartan 50 mg daily, amlodipine 10 mg, and metoprolol 100 mg extended release daily.  According to him ,prior to admission he did not have a PCP but only a Cardiologist who prescribed the warfarin, furosemide, and metoprolol.  He was on no medications for diabetes and obviously was not checking sugars.  ROS was completed by me this morning.  He does not have significant symptoms.  He describes occasional dysphagia and recent constipation.  He has numbness in the right hand intermittently.  He has urinary frequency without other diabetic symptoms.  When asked about memory issues he states that sometimes he will forget multiple instructions.  When I asked about anxiety or depression he laughed and said that he was anxious.  Negative ROS: Constitutional: No fever, significant weight change, fatigue  Eyes: No redness, discharge, pain, vision change ENT/mouth: No nasal congestion, purulent discharge, earache, change in hearing, sore throat  Cardiovascular: No chest pain, palpitations, paroxysmal nocturnal dyspnea, claudication, edema  Respiratory: No cough, sputum production, hemoptysis, DOE, significant snoring, apnea   Gastrointestinal: No heartburn, abdominal pain, nausea /vomiting, rectal bleeding, melena Genitourinary: No dysuria, hematuria, pyuria, incontinence, nocturia Musculoskeletal: No joint stiffness, joint swelling, weakness, pain Dermatologic: No rash, pruritus, change in appearance of skin Neurologic: No dizziness, headache, syncope, seizures Psychiatric: No significant depression, insomnia, anorexia Endocrine: No change in hair/skin/nails, excessive thirst, excessive hunger Hematologic/lymphatic: No significant bruising, lymphadenopathy, abnormal bleeding Allergy/immunology: No itchy/watery eyes, significant sneezing, urticaria, angioedema  Physical exam:  Pertinent or positive findings: Pattern alopecia is present; he is essentially bald over  the crown.  His demeanor is somewhat unusual.  When I ask a question there is a long pause before he responds.  He has a beard and mustache.  Teeth are markedly stained.  He has a tachycardia with accentuation of S1 and S2.  Pedal pulses are decreased.  The left foot is in a walking sandal.  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  There is hyperpigmentation with scaly character over the shins.  Upper extremities are stronger to opposition than the lower extremities.  The left lower extremity is stronger than the right lower extremity.  General appearance: Adequately nourished; no acute distress, increased work of breathing is present.   Lymphatic: No lymphadenopathy about the head, neck, axilla. Eyes: No conjunctival inflammation or lid edema is present. There is no scleral icterus. Ears:  External ear exam shows no significant lesions or deformities.   Nose:  External nasal examination shows no deformity or inflammation. Nasal mucosa are pink and moist without lesions, exudates Neck:  No thyromegaly, masses, tenderness noted.    Heart:  No murmur, click, rub.  Lungs:  without wheezes, rhonchi, rales, rubs. Abdomen: Bowel sounds are normal. Abdomen is soft and nontender with no organomegaly, hernias, masses. GU: Deferred as previously addressed. Extremities:  No cyanosis, clubbing, edema  Neurologic exam:  Balance, Rhomberg, finger to nose testing could not be completed due to clinical state Skin: Warm & dry w/o tenting.  See clinical summary of Discharge Diagnoses in the Problem List with associated updated therapeutic plan

## 2021-06-19 NOTE — Assessment & Plan Note (Signed)
Current creatinine 1.18 / GFR > 60  ; CKD Stage 2 Medication List reviewed.If CKD progresses ARB will be weaned or discontinued.

## 2021-06-20 DIAGNOSIS — R4189 Other symptoms and signs involving cognitive functions and awareness: Secondary | ICD-10-CM | POA: Insufficient documentation

## 2021-06-20 DIAGNOSIS — R29818 Other symptoms and signs involving the nervous system: Secondary | ICD-10-CM | POA: Insufficient documentation

## 2021-06-20 NOTE — Assessment & Plan Note (Signed)
Copy of SLUMS to be given to his daughter as she apparently is main care giver. He seems unconcerned about his risks & has had some history of medication compliance issues . As noted he presented with a necrotic toe with maggot infestation.

## 2021-06-23 ENCOUNTER — Other Ambulatory Visit: Payer: Self-pay | Admitting: Adult Health

## 2021-06-23 MED ORDER — WARFARIN SODIUM 4 MG PO TABS: 4.0000 mg | ORAL_TABLET | Freq: Once | ORAL | 0 refills | Status: DC

## 2021-06-23 MED ORDER — CLOPIDOGREL BISULFATE 75 MG PO TABS: 75.0000 mg | ORAL_TABLET | Freq: Every day | ORAL | 0 refills | Status: DC

## 2021-06-23 MED ORDER — CYANOCOBALAMIN 1000 MCG PO TABS: 1000.0000 ug | ORAL_TABLET | Freq: Every day | ORAL | 0 refills | Status: DC

## 2021-06-23 MED ORDER — LOSARTAN POTASSIUM 50 MG PO TABS: 50.0000 mg | ORAL_TABLET | Freq: Every day | ORAL | 0 refills | Status: DC

## 2021-06-23 MED ORDER — WARFARIN SODIUM 5 MG PO TABS: 5.0000 mg | ORAL_TABLET | Freq: Once | ORAL | 0 refills | Status: DC

## 2021-06-23 MED ORDER — INSULIN GLARGINE 100 UNIT/ML ~~LOC~~ SOLN: 8.0000 [IU] | Freq: Every day | SUBCUTANEOUS | 0 refills | Status: DC

## 2021-06-23 MED ORDER — METOPROLOL SUCCINATE ER 100 MG PO TB24: 100.0000 mg | ORAL_TABLET | Freq: Two times a day (BID) | ORAL | 0 refills | Status: DC

## 2021-06-23 MED ORDER — AMLODIPINE BESYLATE 10 MG PO TABS: 10.0000 mg | ORAL_TABLET | Freq: Every day | ORAL | 0 refills | Status: DC

## 2021-06-23 MED ORDER — VITAMIN D (ERGOCALCIFEROL) 1.25 MG (50000 UNIT) PO CAPS: 50000.0000 [IU] | ORAL_CAPSULE | ORAL | 0 refills | Status: DC

## 2021-06-23 MED ORDER — FUROSEMIDE 20 MG PO TABS: 20.0000 mg | ORAL_TABLET | Freq: Every day | ORAL | 0 refills | Status: DC

## 2021-06-24 ENCOUNTER — Encounter (HOSPITAL_COMMUNITY): Payer: Medicare HMO

## 2021-06-24 ENCOUNTER — Other Ambulatory Visit: Payer: Self-pay

## 2021-06-24 DIAGNOSIS — R768 Other specified abnormal immunological findings in serum: Secondary | ICD-10-CM

## 2021-06-24 DIAGNOSIS — R262 Difficulty in walking, not elsewhere classified: Secondary | ICD-10-CM | POA: Diagnosis not present

## 2021-06-24 DIAGNOSIS — R2681 Unsteadiness on feet: Secondary | ICD-10-CM | POA: Diagnosis not present

## 2021-06-24 DIAGNOSIS — Z4781 Encounter for orthopedic aftercare following surgical amputation: Secondary | ICD-10-CM | POA: Diagnosis not present

## 2021-06-24 DIAGNOSIS — M6281 Muscle weakness (generalized): Secondary | ICD-10-CM | POA: Diagnosis not present

## 2021-06-25 ENCOUNTER — Telehealth: Payer: Self-pay

## 2021-06-25 ENCOUNTER — Encounter: Payer: Self-pay | Admitting: Family

## 2021-06-25 NOTE — Telephone Encounter (Signed)
Patient's daughter [name]) called to advised Darren Blade, NP that she gave her father 80 mg of his lasix instead of 20 mg. She states that he used to take 80 mg and he have old bottles from [facility] pharmacy with 80 mg on it. [name] states that she will like to go over patients medication on tomorrow at his visit with Darren Blade, NP.  Just a [place] Childes

## 2021-06-25 NOTE — Telephone Encounter (Signed)
On chart review looks Furosemide is managed by patient's Cardiologist.

## 2021-06-26 ENCOUNTER — Other Ambulatory Visit (HOSPITAL_COMMUNITY): Payer: Self-pay

## 2021-06-26 ENCOUNTER — Ambulatory Visit (INDEPENDENT_AMBULATORY_CARE_PROVIDER_SITE_OTHER): Payer: Medicare HMO | Admitting: Family

## 2021-06-26 ENCOUNTER — Encounter: Payer: Self-pay | Admitting: Family

## 2021-06-26 ENCOUNTER — Other Ambulatory Visit: Payer: Self-pay

## 2021-06-26 VITALS — BP 110/70 | HR 67 | Temp 97.3°F | Resp 16 | Ht 68.0 in | Wt 202.2 lb

## 2021-06-26 DIAGNOSIS — IMO0002 Reserved for concepts with insufficient information to code with codable children: Secondary | ICD-10-CM

## 2021-06-26 DIAGNOSIS — I130 Hypertensive heart and chronic kidney disease with heart failure and stage 1 through stage 4 chronic kidney disease, or unspecified chronic kidney disease: Secondary | ICD-10-CM | POA: Diagnosis not present

## 2021-06-26 DIAGNOSIS — M25551 Pain in right hip: Secondary | ICD-10-CM

## 2021-06-26 DIAGNOSIS — E1129 Type 2 diabetes mellitus with other diabetic kidney complication: Secondary | ICD-10-CM | POA: Diagnosis not present

## 2021-06-26 DIAGNOSIS — M25561 Pain in right knee: Secondary | ICD-10-CM | POA: Diagnosis not present

## 2021-06-26 DIAGNOSIS — R768 Other specified abnormal immunological findings in serum: Secondary | ICD-10-CM

## 2021-06-26 DIAGNOSIS — K5901 Slow transit constipation: Secondary | ICD-10-CM

## 2021-06-26 DIAGNOSIS — I5032 Chronic diastolic (congestive) heart failure: Secondary | ICD-10-CM

## 2021-06-26 DIAGNOSIS — E785 Hyperlipidemia, unspecified: Secondary | ICD-10-CM

## 2021-06-26 DIAGNOSIS — F321 Major depressive disorder, single episode, moderate: Secondary | ICD-10-CM

## 2021-06-26 DIAGNOSIS — N1832 Chronic kidney disease, stage 3b: Secondary | ICD-10-CM

## 2021-06-26 DIAGNOSIS — E1165 Type 2 diabetes mellitus with hyperglycemia: Secondary | ICD-10-CM

## 2021-06-26 DIAGNOSIS — I480 Paroxysmal atrial fibrillation: Secondary | ICD-10-CM | POA: Diagnosis not present

## 2021-06-26 DIAGNOSIS — N183 Chronic kidney disease, stage 3 unspecified: Secondary | ICD-10-CM

## 2021-06-26 DIAGNOSIS — G8929 Other chronic pain: Secondary | ICD-10-CM

## 2021-06-26 LAB — GLUCOSE, POCT (MANUAL RESULT ENTRY): POC Glucose: 215 mg/dl — AB (ref 70–99)

## 2021-06-26 MED ORDER — POLYETHYLENE GLYCOL 3350 17 GM/SCOOP PO POWD: 17.0000 g | Freq: Every day | ORAL | 1 refills | Status: DC

## 2021-06-26 MED ORDER — FREESTYLE LIBRE 2 SENSOR MISC: 1.0000 "application " | Freq: Two times a day (BID) | 0 refills | Status: DC

## 2021-06-26 MED ORDER — SERTRALINE HCL 25 MG PO TABS: 25.0000 mg | ORAL_TABLET | Freq: Every day | ORAL | 0 refills | Status: DC

## 2021-06-26 MED ORDER — DICLOFENAC SODIUM 1 % EX GEL: 2.0000 g | Freq: Four times a day (QID) | CUTANEOUS | 1 refills | Status: DC

## 2021-06-26 MED ORDER — FREESTYLE LIBRE 2 READER DEVI: 1.0000 "application " | Freq: Two times a day (BID) | 5 refills | Status: DC

## 2021-06-26 MED ORDER — ATORVASTATIN CALCIUM 10 MG PO TABS: 10.0000 mg | ORAL_TABLET | Freq: Every day | ORAL | 3 refills | Status: DC

## 2021-06-26 MED ORDER — POTASSIUM CHLORIDE ER 10 MEQ PO TBCR: 20.0000 meq | EXTENDED_RELEASE_TABLET | Freq: Two times a day (BID) | ORAL | 1 refills | Status: DC

## 2021-06-26 MED ORDER — INSULIN GLARGINE 100 UNIT/ML ~~LOC~~ SOLN: 8.0000 [IU] | Freq: Every day | SUBCUTANEOUS | 1 refills | Status: DC

## 2021-06-26 NOTE — Patient Instructions (Signed)
Keep left foot dressing clean and change dressing every 3 days or when soiled

## 2021-06-26 NOTE — Progress Notes (Signed)
Provider: [name] FNP-C   Sione Baumgarten, Nelda Bucks, NP  Patient Care Team: Dominque Levandowski, Nelda Bucks, NP as PCP - General (Family Medicine) Bensimhon, Shaune Pascal, MD as PCP - Advanced Heart Failure (Cardiology) Troy Sine, MD as Consulting Physician (Cardiology)  Extended Emergency Contact Information Primary Emergency Contact: B[place] Mobile Phone: [phone] Relation: Daughter Secondary Emergency Contact: Derrill Kay States of [place] Phone: [phone] Mobile Phone: [phone] Relation: Other  Code Status:  Full Code  Goals of care: Advanced Directive information Advanced Directives 06/25/2021  Does Patient Have a Medical Advance Directive? No  Would patient like information on creating a medical advance directive? No - Patient declined  Pre-existing out of facility DNR order (yellow form or pink MOST form) -  Some encounter information is confidential and restricted. Go to Review Flowsheets activity to see all data.     Chief Complaint  Patient presents with   Follow-up    Discharge from [place].    HPI:  Pt is a 70 y.o. male seen today for follow up Skilled Rehabilitation discharge from [facility] post hospitalization 05/15/2021 - 05/29/2021 for left fifth toe.discoloration.on exam he had necrosis and maggots.Arteriogram was done 05/18/2021 and multiple lesions were angioplasted.He underwent left fifth toe amputation on 05/22/2021 done by Vascular and vein specialist  Dr.Clark.Has worked with Physical Therapy.He ambulates with Rolling walker.Plavix was initiated for PVD after surgery. Furosemide was held and IVF given for elevated Creatine.renal function improved   On warfarin due to PAF managed by coumadin clinic. Has a medical history of Hypertension with chronic Kidney disease stage 3 b,Type 2 DM,chronic combined systolic and diastolic Heart Failure.Arterial Fibrillation ,Hyperlipidemia,Major depression,Peripheral vascular disease status post left  fifth toe amputation 05/22/2021   He was here 06/13/2021 to establish care with provider prior to being discharge from SNF.she is here with daughter and little granddaughter today. Daughter states stopped his medication that were prescribed at the hospital or [facility] rehabilitation.she also resumed his Furosemide 80 mg tablet twice daily states has taken this for 7 year.I have discussed with her that Furosemide was reduced due to his worsening renal function.Denies  shortness of breath, lower extremity edema, fatigue, palpitations, weakness,orthopnea, and PND. Medication reviewed and reconcilled   Daughter states Left foot dressing not changed since discharge from rehab.No pain,fever or chills.Has upcoming appointment with Surgeon next week. Also has appointment with cardiology.    Past Medical History:  Diagnosis Date   A-fib [facility])    a. (06/04/14) TEE-DC-CV; succesful; large LA 6.2 cm   ADHD    Ascites    Athlete's foot    CHF (congestive heart failure) ([place])    Chronic systolic heart failure ([place])    a. 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, unspecified CKD stage, unspecified whether long term insulin use (HCC)    Heart murmur    HTN (hypertension)    OSA (obstructive sleep apnea)    Past Surgical History:  Procedure Laterality Date   ABDOMINAL AORTOGRAM W/LOWER EXTREMITY N/A 05/19/2021   Procedure: ABDOMINAL AORTOGRAM W/LOWER EXTREMITY;  Surgeon: Marty Heck, MD;  Location: [place] CV LAB;  Service: Cardiovascular;  Laterality: N/A;   AMPUTATION Left 05/22/2021   Procedure: LEFT FIFTH TOE AMPUTATION;  Surgeon: Marty Heck, MD;  Location: [place];  Service:  Vascular;  Laterality: Left;   CARDIOVERSION N/A 06/04/2014   Procedure: CARDIOVERSION;  Surgeon: Larey Dresser, MD;  Location: [place];  Service: Cardiovascular;  Laterality: N/A;   NOSE SURGERY     Nasal septum surgery   PERIPHERAL VASCULAR INTERVENTION Left 05/19/2021   Procedure: PERIPHERAL VASCULAR INTERVENTION;  Surgeon: Marty Heck, MD;  Location: [place] CV LAB;  Service: Cardiovascular;  Laterality: Left;   TEE WITHOUT CARDIOVERSION N/A 06/04/2014   Procedure: TRANSESOPHAGEAL ECHOCARDIOGRAM (TEE);  Surgeon: Larey Dresser, MD;  Location: [place];  Service: Cardiovascular;  Laterality: N/A;   TEE WITHOUT CARDIOVERSION N/A 01/06/2016   Procedure: TRANSESOPHAGEAL ECHOCARDIOGRAM (TEE);  Surgeon: Fay Records, MD;  Location: [facility] ENDOSCOPY;  Service: Cardiovascular;  Laterality: N/A;   VASECTOMY      No Known Allergies  Allergies as of 06/26/2021   No Known Allergies      Medication List        Accurate as of June 26, 2021 11:41 AM. If you have any questions, ask your nurse or doctor.          STOP taking these medications    acetaminophen 500 MG tablet Commonly known as: TYLENOL Stopped by: Sandrea Hughs, NP   amLODipine 10 MG tablet Commonly known as: NORVASC Stopped by: Sandrea Hughs, NP   atorvastatin 10 MG tablet Commonly known as: LIPITOR Stopped by: Sandrea Hughs, NP   barrier cream Crea Commonly known as: non-specified Stopped by: Sandrea Hughs, NP   bisacodyl 10 MG suppository Commonly known as: DULCOLAX Stopped by: Sandrea Hughs, NP   clopidogrel 75 MG tablet Commonly known as: PLAVIX Stopped by: Sandrea Hughs, NP   cyanocobalamin 1000 MCG tablet Stopped by: Sandrea Hughs, NP   feeding supplement ([place]) Liqd Stopped by: Nelda Bucks Burman Bruington, NP   insulin glargine 100 UNIT/ML injection Commonly known as: LANTUS Stopped by: Sandrea Hughs, NP   losartan 50 MG tablet Commonly known as: COZAAR Stopped by: Sandrea Hughs, NP   magnesium hydroxide 400 MG/5ML suspension Commonly known as: MILK OF  MAGNESIA Stopped by: Sandrea Hughs, NP   RA SALINE ENEMA RE Stopped by: Sandrea Hughs, NP   vitamin C 1000 MG tablet Stopped by: Sandrea Hughs, NP   Vitamin D (Ergocalciferol) 1.25 MG (50000 UNIT) Caps capsule Commonly known as: DRISDOL Stopped by: Sandrea Hughs, NP       TAKE these medications    FreeStyle Libre 2 Reader Devi by Does not apply route.   FreeStyle Libre 2 Sensor Misc by Does not apply route.   furosemide 80 MG tablet Commonly known as: LASIX Take 80 mg by mouth 2 (two) times daily. What changed: Another medication with the same name was removed. Continue taking this medication, and follow the directions you see here. Changed by: Sandrea Hughs, NP   metoprolol succinate 100 MG 24 hr tablet Commonly known as: TOPROL-XL Take 100 mg by mouth daily. Take with or immediately following a meal. What changed: Another medication with the same name was removed. Continue taking this medication, and follow the directions you see here. Changed by: Sandrea Hughs, NP   warfarin 6 MG tablet Commonly known as: COUMADIN Take as directed by the anticoagulation clinic. If you are unsure how to take this medication, talk to your nurse or doctor. Original instructions: Take 6 mg by mouth daily. Take 2-1 tablets daily. What changed: Another medication with the same  name was removed. Continue taking this medication, and follow the directions you see here. Changed by: Sandrea Hughs, NP        Review of Systems  Constitutional:  Negative for appetite change, chills, fatigue, fever and unexpected weight change.  HENT:  Negative for congestion, dental problem, ear discharge, ear pain, facial swelling, hearing loss, nosebleeds, postnasal drip, rhinorrhea, sinus pressure, sinus pain, sneezing, sore throat, tinnitus and trouble swallowing.   Eyes:  Positive for visual disturbance. Negative for pain, discharge, redness and itching.  Respiratory:  Negative for cough,  chest tightness, shortness of breath and wheezing.   Cardiovascular:  Negative for chest pain, palpitations and leg swelling.  Gastrointestinal:  Negative for abdominal distention, abdominal pain, blood in stool, constipation, diarrhea, nausea and vomiting.  Endocrine: Negative for cold intolerance, heat intolerance, polydipsia, polyphagia and polyuria.  Genitourinary:  Negative for difficulty urinating, dysuria, flank pain, frequency and urgency.  Musculoskeletal:  Positive for arthralgias and gait problem. Negative for back pain, joint swelling, myalgias, neck pain and neck stiffness.       Right hip and knee pain   Skin:  Negative for color change, pallor and rash.       Left 5 th toe amputee   Neurological:  Negative for dizziness, syncope, speech difficulty, weakness, light-headedness, numbness and headaches.  Hematological:  Does not bruise/bleed easily.  Psychiatric/Behavioral:  Negative for agitation, behavioral problems, confusion, hallucinations, self-injury, sleep disturbance and suicidal ideas. The patient is not nervous/anxious.        Depression    Immunization History  Administered Date(s) Administered   Tdap 05/01/2016   Pertinent  Health Maintenance Due  Topic Date Due   FOOT EXAM  Never done   OPHTHALMOLOGY EXAM  Never done   INFLUENZA VACCINE  06/09/2021   HEMOGLOBIN A1C  12/14/2021   COLONOSCOPY (Pts 45-66yr Insurance coverage will need to be confirmed)  Discontinued   PNA vac Low Risk Adult  Discontinued   Fall Risk  06/25/2021 06/13/2021  Falls in the past year? 0 0  Number falls in past yr: 0 0  Injury with Fall? 0 0  Risk for fall due to : No Fall Risks No Fall Risks  Follow up Falls evaluation completed Falls evaluation completed   Functional Status Survey:    Vitals:   06/26/21 1131  BP: 110/70  Pulse: 67  Resp: 16  Temp: (!) 97.3 F (36.3 C)  SpO2: 94%  Weight: 168 lb 3.2 oz (76.3 kg)  Height: '5\' 8"'  (1.727 m)   Body mass index is 25.57  kg/m. Physical Exam Vitals reviewed.  Constitutional:      General: He is not in acute distress.    Appearance: Normal appearance. He is overweight. He is not ill-appearing or diaphoretic.  HENT:     Head: Normocephalic.     Right Ear: Tympanic membrane, ear canal and external ear normal. There is no impacted cerumen.     Left Ear: Tympanic membrane, ear canal and external ear normal. There is no impacted cerumen.     Nose: Nose normal. No congestion or rhinorrhea.     Mouth/Throat:     Mouth: Mucous membranes are moist.     Pharynx: Oropharynx is clear. No oropharyngeal exudate or posterior oropharyngeal erythema.  Eyes:     General: No scleral icterus.       Right eye: No discharge.        Left eye: No discharge.     Extraocular Movements: Extraocular movements  intact.     Conjunctiva/sclera: Conjunctivae normal.     Pupils: Pupils are equal, round, and reactive to light.  Neck:     Vascular: No carotid bruit.  Cardiovascular:     Rate and Rhythm: Normal rate and regular rhythm.     Pulses: Normal pulses.     Heart sounds: Normal heart sounds. No murmur heard.   No friction rub. No gallop.  Pulmonary:     Effort: Pulmonary effort is normal. No respiratory distress.     Breath sounds: Normal breath sounds. No wheezing, rhonchi or rales.  Chest:     Chest wall: No tenderness.  Abdominal:     General: Bowel sounds are normal. There is no distension.     Palpations: Abdomen is soft. There is no mass.     Tenderness: There is no abdominal tenderness. There is no right CVA tenderness, left CVA tenderness, guarding or rebound.  Musculoskeletal:        General: No swelling or tenderness. Normal range of motion.     Cervical back: Normal range of motion. No rigidity or tenderness.     Right lower leg: No edema.     Left lower leg: No edema.  Lymphadenopathy:     Cervical: No cervical adenopathy.  Skin:    General: Skin is warm and dry.     Coloration: Skin is not pale.      Findings: No bruising, erythema, lesion or rash.  Neurological:     Mental Status: He is alert and oriented to person, place, and time.     Cranial Nerves: No cranial nerve deficit.     Sensory: No sensory deficit.     Motor: No weakness.     Coordination: Coordination normal.     Gait: Gait abnormal.  Psychiatric:        Mood and Affect: Mood is depressed.        Speech: Speech normal.        Behavior: Behavior normal.        Thought Content: Thought content normal.        Judgment: Judgment normal.    Labs reviewed: Recent Labs    05/27/21 1357 05/28/21 0636 05/29/21 0034 06/13/21 0941  NA 137 134*  --  139  K 5.2* 4.6  --  4.3  CL 99 101  --  102  CO2 31 26  --  28  GLUCOSE 178* 190*  --  183*  BUN 18 18  --  19  CREATININE 1.17 1.00 1.17 1.18  CALCIUM 9.3 9.0  --  9.2   Recent Labs    07/16/20 1044 05/15/21 1853 06/13/21 0941  AST '18 16 19  ' ALT 25 23 38  ALKPHOS 29* 42  --   BILITOT 1.1 1.1 0.8  PROT 7.6 7.9 7.3  ALBUMIN 3.9 3.9  --    Recent Labs    05/15/21 1853 05/16/21 0310 05/27/21 0053 05/28/21 0636 06/13/21 0941  WBC 10.7*   < > 9.2 7.8 8.1  NEUTROABS 7.8*  --   --   --  5,881  HGB 13.2   < > 11.7* 12.2* 14.7  HCT 40.2   < > 36.4* 36.9* 44.8  MCV 89.1   < > 90.1 88.3 88.7  PLT 211   < > 254 246 261   < > = values in this interval not displayed.   Lab Results  Component Value Date   TSH 2.01 06/13/2021   Lab Results  Component Value Date   HGBA1C 8.2 (H) 06/13/2021   Lab Results  Component Value Date   CHOL 140 06/13/2021   HDL 33 (L) 06/13/2021   LDLCALC 80 06/13/2021   TRIG 176 (H) 06/13/2021   CHOLHDL 4.2 06/13/2021    Significant Diagnostic Results in last 30 days:  No results found.  Assessment/Plan  1. Hypertensive heart and kidney disease with chronic diastolic congestive heart failure and stage 3 chronic kidney disease, unspecified whether stage 3a or 3b CKD (HCC) B/p well controlled. Continue on metoprolol and  furosemide  - daughter stopped Amlodipine and losartan states does not need all the medication that was added at the hospital or SNF [facility]. - potassium chloride (KLOR-CON 10) 10 MEQ tablet; Take 2 tablets (20 mEq total) by mouth 2 (two) times daily.  Dispense: 180 tablet; Refill: 1  Lab Results  Component Value Date   HGBA1C 8.2 (H) 06/13/2021  No home CBG for review request Free Style Libre  - Continuous Blood Gluc Receiver (FREESTYLE LIBRE 2 READER) DEVI; 1 application by Does not apply route 2 (two) times daily.  Dispense: 1 each; Refill: 5 - Continuous Blood Gluc Sensor (FREESTYLE LIBRE 2 SENSOR) MISC; 1 application by Does not apply route 2 (two) times daily.  Dispense: 1 each; Refill: 0 - insulin glargine (LANTUS) 100 UNIT/ML injection; Inject 0.08 mLs (8 Units total) into the skin daily.  Dispense: 10 mL; Refill: 1 - POC Glucose (CBG)  3. PAF (paroxysmal atrial fibrillation) (HCC) HR controlled.continue on Warfarin for anticoagulation and Metoprolol HR control.  No signs of bleeding reported. H/H stable.   4. Current moderate episode of major depressive disorder, unspecified whether recurrent (HCC) Sertraline ordered on previous visit but daughter states has not picked up medication. Continues to be depressed states just want to die inquires whether he qualifies for hospice.No suicide ideation or injury to self or others.Angry towards granddaughter due to playing and talking during the visit.  - sertraline (ZOLOFT) 25 MG tablet; Take 1 tablet (25 mg total) by mouth daily.  Dispense: 90 tablet; Refill: 0  5. Stage 3b chronic kidney disease (HCC) CR stable. Continue to monitor. Will continue to avoid Nephrotoxins and dose all other medication for renal clearance   6. Slow transit constipation No BM since discharge from rehab  - encouraged to increase fiber in diet  - increase water intake to 6-8 glasses of water daily and exercise as tolerated  - will add miralax  -  polyethylene glycol powder (GLYCOLAX/MIRALAX) 17 GM/SCOOP powder; Take 17 g by mouth daily. Hold for loose stool  Dispense: 3350 g; Refill: 1  7. Hyperlipidemia LDL goal <70 LDL at goal  Continue on Lipitor  - atorvastatin (LIPITOR) 10 MG tablet; Take 1 tablet (10 mg total) by mouth daily.  Dispense: 30 tablet; Refill: 3  8. Chronic pain of right knee Chronic  - diclofenac Sodium (VOLTAREN) 1 % GEL; Apply 2 g topically 4 (four) times daily.  Dispense: 100 g; Refill: 1  9. Chronic right hip pain Chronic  - diclofenac Sodium (VOLTAREN) 1 % GEL; Apply 2 g topically 4 (four) times daily.  Dispense: 100 g; Refill: 1  10. 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   Family/ staff Communication: Reviewed plan of care with patient and daughter verbalized understanding   Labs/tests ordered: - CBC with Differential/Platelet - CMP with eGFR([facility]) - TSH - Hgb A1C - Lipid  panel  Next Appointment : 6 months for medical management of chronic issues.Fasting Labs prior to visit.    Sandrea Hughs, NP

## 2021-06-30 ENCOUNTER — Telehealth: Payer: Self-pay | Admitting: *Deleted

## 2021-06-30 NOTE — Telephone Encounter (Signed)
Melissa with [facility] called and stated that patient is scheduled for surgery on Wednesday and she is needing a current medication list faxed to her office at Fax:[phone].   Printed and Faxed.

## 2021-07-01 ENCOUNTER — Ambulatory Visit: Payer: Medicare HMO | Admitting: Orthopaedic Surgery

## 2021-07-02 DIAGNOSIS — H2521 Age-related cataract, morgagnian type, right eye: Secondary | ICD-10-CM | POA: Diagnosis not present

## 2021-07-02 DIAGNOSIS — H2511 Age-related nuclear cataract, right eye: Secondary | ICD-10-CM | POA: Diagnosis not present

## 2021-07-02 DIAGNOSIS — H25811 Combined forms of age-related cataract, right eye: Secondary | ICD-10-CM | POA: Diagnosis not present

## 2021-07-03 ENCOUNTER — Telehealth: Payer: Self-pay | Admitting: *Deleted

## 2021-07-03 NOTE — Telephone Encounter (Signed)
Pt is overdue for INR check; pt was recently discharged from the hospital and went to Rehab. Called pt to see if he is home from Rehab but the voicemail is not set up. Will try back at a later time.

## 2021-07-10 ENCOUNTER — Ambulatory Visit: Payer: Medicare HMO | Admitting: Skilled Nursing Facility1

## 2021-07-18 ENCOUNTER — Telehealth: Payer: Self-pay | Admitting: *Deleted

## 2021-07-18 NOTE — Telephone Encounter (Signed)
Called pt since he is overdue for Anticoagulation Monitoring; left a message for the pt to call back.

## 2021-07-22 ENCOUNTER — Telehealth: Payer: Self-pay | Admitting: *Deleted

## 2021-07-22 NOTE — Telephone Encounter (Signed)
Pt is overdue for Anticoagulation Monitoring; left a message for the pt to call back to get scheduled.

## 2021-07-24 ENCOUNTER — Other Ambulatory Visit: Payer: Self-pay

## 2021-07-24 ENCOUNTER — Ambulatory Visit (INDEPENDENT_AMBULATORY_CARE_PROVIDER_SITE_OTHER): Payer: Medicare HMO | Admitting: *Deleted

## 2021-07-24 DIAGNOSIS — I4892 Unspecified atrial flutter: Secondary | ICD-10-CM

## 2021-07-24 DIAGNOSIS — I8290 Acute embolism and thrombosis of unspecified vein: Secondary | ICD-10-CM | POA: Diagnosis not present

## 2021-07-24 DIAGNOSIS — I513 Intracardiac thrombosis, not elsewhere classified: Secondary | ICD-10-CM

## 2021-07-24 DIAGNOSIS — I4891 Unspecified atrial fibrillation: Secondary | ICD-10-CM | POA: Diagnosis not present

## 2021-07-24 DIAGNOSIS — Z5181 Encounter for therapeutic drug level monitoring: Secondary | ICD-10-CM | POA: Diagnosis not present

## 2021-07-24 LAB — POCT INR: INR: 2.2 (ref 2.0–3.0)

## 2021-07-24 MED ORDER — WARFARIN SODIUM 6 MG PO TABS: 6.0000 mg | ORAL_TABLET | Freq: Every day | ORAL | 0 refills | Status: DC

## 2021-07-24 NOTE — Telephone Encounter (Signed)
Prescription refill request received for warfarin [place]: 04/18/20 ([place]) Next INR check: 08/18/21 Warfarin tablet strength: 6mg   Pt overdue for office visit; however, at today's INR check, pt stated he was almost out of Warfarin. Appropriate dose and refill sent to requested pharmacy and message sent to schedulers.

## 2021-07-24 NOTE — Patient Instructions (Signed)
Description   Continue taking Warfarin 1 tablet daily except 1/2 tablet on Tuesdays and Saturdays. Recheck INR in 4 weeks. Main # [phone] Coumadin Clinic [phone].

## 2021-07-25 ENCOUNTER — Telehealth: Payer: Self-pay | Admitting: *Deleted

## 2021-07-25 ENCOUNTER — Ambulatory Visit (HOSPITAL_COMMUNITY)
Admission: RE | Admit: 2021-07-25 | Discharge: 2021-07-25 | Disposition: A | Discharge status: Home / Self Care | Payer: Medicare HMO | Source: Ambulatory Visit | Attending: Vascular Surgery | Admitting: Vascular Surgery

## 2021-07-25 ENCOUNTER — Ambulatory Visit (INDEPENDENT_AMBULATORY_CARE_PROVIDER_SITE_OTHER)
Admission: RE | Admit: 2021-07-25 | Discharge: 2021-07-25 | Disposition: A | Discharge status: Home / Self Care | Payer: Medicare HMO | Source: Ambulatory Visit | Attending: Vascular Surgery | Admitting: Vascular Surgery

## 2021-07-25 DIAGNOSIS — I739 Peripheral vascular disease, unspecified: Secondary | ICD-10-CM | POA: Insufficient documentation

## 2021-07-25 MED ORDER — FUROSEMIDE 20 MG PO TABS: 20.0000 mg | ORAL_TABLET | Freq: Two times a day (BID) | ORAL | 3 refills | Status: DC

## 2021-07-25 NOTE — Telephone Encounter (Signed)
Patient daughter walked into office stating that pharmacy faxed [place] Authorization for patient's blood sugar machine. Stated that insurance will not cover the machine that was sent to pharmacy.   I instructed daughter to call patient's insurance company to see what blood sugar machine was covered and she stated that she will call and let us know.   Daughter is also requesting a refill on patient's Furosemide, stated that he has none left.   Pended and sent to [facility] for approval.  Awaiting the name of blood sugar Machine that is covered by insurance.

## 2021-07-25 NOTE — Telephone Encounter (Signed)
Please verify which glucometer is covered by insurance.Furosemide send to pharmacy.

## 2021-07-28 ENCOUNTER — Telehealth: Payer: Self-pay | Admitting: Adult Health

## 2021-07-28 ENCOUNTER — Other Ambulatory Visit (HOSPITAL_COMMUNITY): Payer: Self-pay

## 2021-07-28 ENCOUNTER — Telehealth: Payer: Self-pay

## 2021-07-28 ENCOUNTER — Other Ambulatory Visit: Payer: Self-pay

## 2021-07-28 ENCOUNTER — Ambulatory Visit (INDEPENDENT_AMBULATORY_CARE_PROVIDER_SITE_OTHER): Payer: Medicare HMO | Admitting: Physician Assistant

## 2021-07-28 ENCOUNTER — Encounter: Payer: Medicare HMO | Admitting: Infectious Diseases

## 2021-07-28 DIAGNOSIS — E785 Hyperlipidemia, unspecified: Secondary | ICD-10-CM

## 2021-07-28 MED ORDER — ATORVASTATIN CALCIUM 10 MG PO TABS: 10.0000 mg | ORAL_TABLET | Freq: Every day | ORAL | 3 refills | Status: DC

## 2021-07-28 NOTE — Telephone Encounter (Signed)
[facility] Patient Product/process development scientist completed.    The patient is insured through [employer].  Insurance Prefer [place] or [place]. Medication will need a PA.  We will continue to follow to see if copay assistance is needed.  Clearance Coots, CPhT Specialty Pharmacy Patient [facility] Phone: [phone] Fax:  [phone]

## 2021-07-28 NOTE — Telephone Encounter (Signed)
Patient has request refill on medication Vitamin D. Patient hasnt had prescription refilled by PCP Ngetich, Dinah C, NP . Medication pend and sent to Hazle Nordmann, NP due to PCP Ngetich, Donalee Citrin, NP being out of office.

## 2021-07-28 NOTE — Progress Notes (Signed)
HISTORY AND PHYSICAL     CC:  follow up. Requesting Provider:  Caesar Bookman, NP  HPI: This is a 70 y.o. male who is here today for follow up for PAD.  He was seen in the hospital in July for left 5th toe gangrene.  He had been having pain and blistering for about 7 weeks that had progressed.  It was frankly necrotic with maggot infestation.  The did not have any claudication or rest pain.    On May 19, 2021, he underwent left peroneal angioplasty via right CFA by Dr. Chestine Spore.  On May 22, 2021, he underwent left 5th toe amputation also by Dr. Chestine Spore.    The pt returns today for follow up accompanied by his daughter.  He denies any pain today.  He is not very conversant today.  His daughter states that he has not been walking a lot and laying around.  He does not smoke.   She inquires about care of his feet.      Past Medical History:  Diagnosis Date   A-fib [facility])    a. (06/04/14) TEE-DC-CV; succesful; large LA 6.2 cm   ADHD    Ascites    Athlete's foot    CHF (congestive heart failure) (HCC)    Chronic systolic heart failure (HCC)    a. 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, unspecified CKD stage, unspecified whether long term insulin use (HCC)    Heart murmur    HTN (hypertension)    OSA (obstructive sleep apnea)     Past Surgical History:  Procedure Laterality Date   ABDOMINAL AORTOGRAM W/LOWER EXTREMITY N/A 05/19/2021   Procedure: ABDOMINAL AORTOGRAM W/LOWER EXTREMITY;  Surgeon: Cephus Shelling, MD;  Location: [facility] INVASIVE CV LAB;  Service: Cardiovascular;  Laterality: N/A;   AMPUTATION Left 05/22/2021   Procedure: LEFT FIFTH TOE AMPUTATION;  Surgeon: Cephus Shelling, MD;  Location: [facility] OR;  Service: Vascular;  Laterality: Left;   CARDIOVERSION N/A 06/04/2014    Procedure: CARDIOVERSION;  Surgeon: Laurey Morale, MD;  Location: [facility] ENDOSCOPY;  Service: Cardiovascular;  Laterality: N/A;   NOSE SURGERY     Nasal septum surgery   PERIPHERAL VASCULAR INTERVENTION Left 05/19/2021   Procedure: PERIPHERAL VASCULAR INTERVENTION;  Surgeon: Cephus Shelling, MD;  Location: [facility] INVASIVE CV LAB;  Service: Cardiovascular;  Laterality: Left;   TEE WITHOUT CARDIOVERSION N/A 06/04/2014   Procedure: TRANSESOPHAGEAL ECHOCARDIOGRAM (TEE);  Surgeon: Laurey Morale, MD;  Location: [facility] ENDOSCOPY;  Service: Cardiovascular;  Laterality: N/A;   TEE WITHOUT CARDIOVERSION N/A 01/06/2016   Procedure: TRANSESOPHAGEAL ECHOCARDIOGRAM (TEE);  Surgeon: Pricilla Riffle, MD;  Location: [facility] ENDOSCOPY;  Service: Cardiovascular;  Laterality: N/A;   VASECTOMY      No Known Allergies  Current Outpatient Medications  Medication Sig Dispense Refill   atorvastatin (LIPITOR) 10 MG tablet Take 1 tablet (10 mg total) by mouth daily. 30 tablet 3   Continuous Blood Gluc Receiver (FREESTYLE LIBRE 2 READER) DEVI 1 application by Does not apply route 2 (two) times daily. 1 each 5   Continuous Blood Gluc Sensor (FREESTYLE LIBRE 2 SENSOR) MISC 1 application by Does not apply route 2 (two) times daily. 1 each 0   Cyanocobalamin (VITAMIN B-12 PO) Take 1 capsule by mouth daily.  diclofenac Sodium (VOLTAREN) 1 % GEL Apply 2 g topically 4 (four) times daily. 100 g 1   Ergocalciferol (VITAMIN D2 PO) Take 1 capsule by mouth daily.     furosemide (LASIX) 20 MG tablet Take 1 tablet (20 mg total) by mouth 2 (two) times daily. 60 tablet 3   insulin glargine (LANTUS) 100 UNIT/ML injection Inject 0.08 mLs (8 Units total) into the skin daily. 10 mL 1   metoprolol succinate (TOPROL-XL) 100 MG 24 hr tablet Take 100 mg by mouth daily. Take with or immediately following a meal.     polyethylene glycol powder (GLYCOLAX/MIRALAX) 17 GM/SCOOP powder Take 17 g by mouth daily. Hold for loose stool 3350 g 1   potassium  chloride (KLOR-CON 10) 10 MEQ tablet Take 2 tablets (20 mEq total) by mouth 2 (two) times daily. 180 tablet 1   sertraline (ZOLOFT) 25 MG tablet Take 1 tablet (25 mg total) by mouth daily. 90 tablet 0   VITAMIN D PO Take 1 capsule by mouth daily.     Vitamin D, Ergocalciferol, (DRISDOL) 1.25 MG (50000 UNIT) CAPS capsule TAKE 1 CAPSULE BY MOUTH EVERY THURSDAY 4 capsule 0   warfarin (COUMADIN) 6 MG tablet Take 1 tablet (6 mg total) by mouth daily. Take 2-1 tablets daily. 90 tablet 0   No current facility-administered medications for this visit.    Family History  Problem Relation Age of Onset   Heart attack Mother        deceased   Diabetes Mother    Heart attack Sister     Social History   Socioeconomic History   Marital status: Single    Spouse name: Not on file   Number of children: Not on file   Years of education: Not on file   Highest education level: Not on file  Occupational History   Not on file  Tobacco Use   Smoking status: Former    Types: Cigarettes   Smokeless tobacco: Never  Substance and Sexual Activity   Alcohol use: Not Currently   Drug use: Never   Sexual activity: Not on file  Other Topics Concern   Not on file  Social History Narrative   ** Merged History Encounter ** Lives in [place] by himself. Retired from [employer] for [employer].       Tobacco use, amount per day now:   Past tobacco use, amount per day:   How many years did you use tobacco:   Alcohol use (drinks per week): N/A   Diet:   Do you drink/eat things with caffeine:   Marital status:  Divorced                                What year were you married? 2003   Do you live in a house, apartment, assisted living, condo, trailer, etc.? House   Is it one or more stories? One   How many persons live in your home?   Do you have pets in your home?( please list) N/A   Highest Level of education completed? Bachelors Degree   Current or past profession: Teacher-Special Ed    Do you exercise?  Yes                                Type and how often? Daily squats, and push ups.   Do  you have a living will? No   Do you have a DNR form?  No                                 If not, do you want to discuss one?   Do you have signed POA/HPOA forms?  No                      If so, please bring to you appointment      Do you have any difficulty bathing or dressing yourself? Bathing only if pants not shirts/not shorts.   Do you have any difficulty preparing food or eating? No   Do you have any difficulty managing your medications? No   Do you have any difficulty managing your finances? No   Do you have any difficulty affording your medications? No   Social Determinants of Corporate investment banker Strain: Not on file  Food Insecurity: Not on file  Transportation Needs: Not on file  Physical Activity: Not on file  Stress: Not on file  Social Connections: Not on file  Intimate Partner Violence: Not on file     PHYSICAL EXAMINATION:  Today's Vitals   07/28/21 1034  BP: (!) 156/75  Pulse: (!) 46  Resp: 18  Temp: (!) 97.5 F (36.4 C)  TempSrc: Temporal  Height: 5\' 8"  (1.727 m)   Body mass index is 30.74 kg/m.   General:  WDWN in NAD; vital signs documented above  Vascular Exam/Pulses:  Right Left  Peroneal + doppler signal + doppler signal  DP + doppler signal + doppler signal  PT + doppler signal + doppler signal   Extremities:  left great toe amp site has healed nicely and nylon sutures have not been removed.  Some hemosiderin staining on BLE with some varicosities present. No other wounds noted.     Non-Invasive Vascular Imaging:   ABI's/TBI's on 07/25/2021: Right:  0.96/0.40 - Great toe pressure: 72 Left:  1.03/0.52 - Great toe pressure: 94  LLE Arterial duplex on 07/25/2021: +-----------+--------+-----+--------+----------+--------+  LEFT       PSV cm/sRatioStenosisWaveform  Comments   +-----------+--------+-----+--------+----------+--------+  CFA Distal 125                  biphasic            +-----------+--------+-----+--------+----------+--------+  DFA        52                   biphasic            +-----------+--------+-----+--------+----------+--------+  SFA Prox   72                   triphasic           +-----------+--------+-----+--------+----------+--------+  SFA Mid    85                   triphasic           +-----------+--------+-----+--------+----------+--------+  SFA Distal 62                   triphasic           +-----------+--------+-----+--------+----------+--------+  POP Prox   56                   triphasic           +-----------+--------+-----+--------+----------+--------+  POP Distal 100                  triphasic           +-----------+--------+-----+--------+----------+--------+  ATA Distal 30                   monophasic          +-----------+--------+-----+--------+----------+--------+  PTA Distal 39                   monophasic          +-----------+--------+-----+--------+----------+--------+  PERO Prox  141                  biphasic            +-----------+--------+-----+--------+----------+--------+  PERO Mid   40                   biphasic            +-----------+--------+-----+--------+----------+--------+  PERO Distal55                   monophasic          +-----------+--------+-----+--------+----------+--------+   Summary:  Left: No obvious stenosis noted in the peroneal artery. No hemodynamically  significant stenosis noted in the left lower extremity.   Previous ABI on 05/16/2021: Right:  1.11/0.24 Left:  0.80/0.21   ASSESSMENT/PLAN:: 70 y.o. male here for follow up for PAD with hx of left peroneal angioplasty and left 5th toe amputation in July 2022 by Dr. Chestine Spore  -pt's toe amp site has healed.  Sutures to be removed today.  His ABI has  improved and no stenosis on duplex.  -discussed with daughter to wash feet with dial soap and water daily.  Keep feet moisturized to prevent cracking.  Discussed with her to avoid pedicures to avoid any wounds.  Recommend podiatry for nail trims.   -pt's ABI improved and no stenosis in the peroneal artery on the left arterial duplex.  -he has not been taking his Lipitor and I have sent Rx to [facility] on [address].  Continue plavix/coumadin.  -recommend elevating legs and low grade compression knee high socks.  -pt will f/u in 6 months with ABI and LLE arterial duplex. --pt also inquired about his BP-recommend getting BP cuff and recording BP in morning and evening to have log to to take to PCP or cardiologist    Doreatha Massed, [facility] [phone]  Clinic MD:   Myra Gianotti

## 2021-07-29 MED ORDER — ATORVASTATIN CALCIUM 10 MG PO TABS: 10.0000 mg | ORAL_TABLET | Freq: Every day | ORAL | 3 refills | Status: DC

## 2021-07-29 NOTE — Addendum Note (Signed)
Addended by: Dara Lords on: 07/29/2021 08:34 AM   Modules accepted: Orders

## 2021-07-30 ENCOUNTER — Telehealth: Payer: Self-pay | Admitting: Cardiology

## 2021-07-30 NOTE — Telephone Encounter (Signed)
Returned call to the pharmacy & the last Warfarin refill was sent with 2 different instructions. Therefore, spoke with [name] and clarified the prescription for her; pt takes 1 tablet daily except 1/2 tablet on Tuesday and Saturday. She will process refill at this time.

## 2021-07-30 NOTE — Telephone Encounter (Signed)
Pt c/o medication issue:  1. Name of Medication:  warfarin (COUMADIN) 6 MG tablet  2. How are you currently taking this medication (dosage and times per day)?   3. Are you having a reaction (difficulty breathing--STAT)?   4. What is your medication issue?   Misty Stanley with [facility] is requesting clarification on patient's medication instructions. Please return call to discuss at [phone].

## 2021-07-31 ENCOUNTER — Other Ambulatory Visit: Payer: Self-pay

## 2021-07-31 DIAGNOSIS — I739 Peripheral vascular disease, unspecified: Secondary | ICD-10-CM

## 2021-08-01 NOTE — Telephone Encounter (Signed)
Tried calling daughter to check if she has called insurance regarding blood sugar Machine.  LMOM to return call.

## 2021-08-04 ENCOUNTER — Encounter: Payer: Medicare HMO | Admitting: Infectious Diseases

## 2021-08-04 NOTE — Progress Notes (Deleted)
Patient Name: [name]  Date of Birth: [birth year]  MRN: [id number]  PCP: Caesar Bookman, NP  Referring Provider: Caesar Bookman, NP, Ph#: [phone]    CC:  New patient - initial evaluation for reactive hepatitis C blood antibody test.   HPI/ROS:  [name] is a 70 y.o. male here for first visit.   Seen by PCP recently with reactive hepatitis C antibody; reflex RNA was < 15 copies but flagged "detected."   Patient tested positive {time; misc:30499}. Hepatitis C-associated risk factors present are: {hep c risks pos:13207}. Patient denies {hep c risks neg:13208}. Patient {has/not:15037} had other studies performed. Results: {hep c studies:13209}. Patient {has/not:15037} had prior treatment for Hepatitis C. Patient {does/do/not:33181} have a past history of liver disease. Patient {does/do/not:33181} have a family history of liver disease. Patient {does/do/not:33181}  have associated signs or symptoms related to liver disease.  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   Records reviewed from ***  ***  Patient {does/does not:19866} have documented immunity to Hepatitis A. Patient {does/does not:19867} have documented immunity to Hepatitis B.    {ros - complete:22885} All other systems reviewed and are negative      Past Medical History:  Diagnosis Date   A-fib (HCC)    a. (06/04/14) TEE-DC-CV; succesful; large LA 6.2 cm   ADHD    Ascites    Athlete's foot    CHF (congestive heart failure) (HCC)    Chronic systolic heart failure (HCC)    a. 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 Authorizing Provider  atorvastatin (LIPITOR) 10 MG tablet Take 1 tablet (10 mg total) by mouth daily. 07/29/21   Rhyne, Ames Coupe, PA-C  Continuous Blood Gluc Receiver (FREESTYLE LIBRE 2 READER) DEVI 1 application by Does not apply route 2 (two) times daily. Patient not taking: Reported on 07/28/2021 06/26/21   Ngetich, Dinah C, NP  Continuous Blood Gluc Sensor (FREESTYLE LIBRE 2 SENSOR) MISC 1 application by Does not apply route 2 (two) times daily. Patient not taking: Reported on 07/28/2021 06/26/21   Ngetich, Dinah C, NP  Cyanocobalamin (VITAMIN B-12 PO) Take 1 capsule by mouth daily.    [provider]  diclofenac Sodium (VOLTAREN) 1 % GEL Apply 2 g topically 4 (four) times daily. 06/26/21   Ngetich, Dinah C, NP  Ergocalciferol (VITAMIN D2 PO) Take 1 capsule by mouth daily.    [provider]  furosemide (LASIX) 20 MG tablet Take 1 tablet (20 mg total) by mouth 2 (two) times daily. 07/25/21   Ngetich, Dinah C, NP  insulin glargine (LANTUS) 100 UNIT/ML injection Inject 0.08 mLs (8 Units total) into the skin daily. 06/26/21 09/24/21  Ngetich, Dinah C, NP  metoprolol succinate (TOPROL-XL) 100 MG 24 hr tablet Take 100 mg by mouth daily. Take with or immediately following a meal.    [provider]  polyethylene glycol powder (GLYCOLAX/MIRALAX) 17 GM/SCOOP powder Take 17 g by mouth daily. Hold for loose stool 06/26/21   Ngetich,  Dinah C, NP  potassium chloride (KLOR-CON 10) 10 MEQ tablet Take 2 tablets (20 mEq total) by mouth 2 (two) times daily. 06/26/21 09/24/21  Ngetich, Dinah C, NP  sertraline (ZOLOFT) 25 MG tablet Take 1 tablet (25 mg total) by mouth daily. 06/26/21 09/24/21  Ngetich, Dinah C, NP  VITAMIN D PO Take 1 capsule by mouth daily. Patient not taking: Reported on 07/28/2021    [provider]  Vitamin D, Ergocalciferol, (DRISDOL) 1.25 MG (50000 UNIT) CAPS capsule TAKE 1 CAPSULE BY  MOUTH EVERY THURSDAY 07/28/21   Fargo, Amy E, NP  warfarin (COUMADIN) 6 MG tablet Take 1 tablet (6 mg total) by mouth daily. Take 2-1 tablets daily. 07/24/21   Camnitz, Andree Coss, MD    No Known Allergies  Social History   Tobacco Use   Smoking status: Former    Types: Cigarettes   Smokeless tobacco: Never  Substance Use Topics   Alcohol use: Not Currently   Drug use: Never    Family History  Problem Relation Age of Onset   Heart attack Mother        deceased   Diabetes Mother    Heart attack Sister    ***  Objective:  There were no vitals filed for this visit. Constitutional: {EXAM; GENERAL APPEARANCE:5021} Eyes: anicteric Cardiovascular: {Mis exam cardio:32073} Respiratory: {Exam; lungs brief:12271} Gastrointestinal: {Exam; abdomen:5794} Musculoskeletal: {extremities:315109} Skin: {Skin exam gi:12013}; no porphyria cutanea tarda Lymphatic: no cervical lymphadenopathy   Laboratory: Genotype: No results found for: HCVGENOTYPE HCV viral load: No results found for: HCVQUANT Lab Results  Component Value Date   WBC 8.1 06/13/2021   HGB 14.7 06/13/2021   HCT 44.8 06/13/2021   MCV 88.7 06/13/2021   PLT 261 06/13/2021    Lab Results  Component Value Date   CREATININE 1.18 06/13/2021   BUN 19 06/13/2021   NA 139 06/13/2021   K 4.3 06/13/2021   CL 102 06/13/2021   CO2 28 06/13/2021    Lab Results  Component Value Date   ALT 38 06/13/2021   AST 19 06/13/2021   ALKPHOS 42 05/15/2021    Lab Results  Component Value Date   INR 2.2 07/24/2021   BILITOT 0.8 06/13/2021   ALBUMIN 3.9 05/15/2021    APRI ***   FIB-4 ***   Imaging:  ***  Assessment & Plan:   Problem List Items Addressed This Visit   None   I spent 45 minutes with the patient including greater than 70% of time in face to face counsel of the patient re hepatitis c and the details described above and in coordination of their care.  Rexene Alberts, MSN, NP-C [facility]  [email or web address] Pager: [phone] Office: [phone] [facility] Main Line: [phone]

## 2021-08-06 ENCOUNTER — Encounter: Payer: Medicare HMO | Admitting: Infectious Diseases

## 2021-08-10 ENCOUNTER — Other Ambulatory Visit (HOSPITAL_COMMUNITY): Payer: Self-pay | Admitting: Internal Medicine

## 2021-08-15 ENCOUNTER — Other Ambulatory Visit: Payer: Self-pay | Admitting: Adult Health

## 2021-08-18 NOTE — Telephone Encounter (Signed)
Please verify if still taking Plavix along with Warfarin.might need to clarify with Cardiologist.

## 2021-08-18 NOTE — Telephone Encounter (Signed)
Patient request refill on medication "Plavix 75mg ". Last request I denied because it wasn't on patient medication list. Medication pend and sent to PCP Ngetich, Dinah C, NP . Please Advise.

## 2021-08-18 NOTE — Telephone Encounter (Signed)
Called patient and no answer. Voicemail was left with office call back number.   

## 2021-08-18 NOTE — Telephone Encounter (Signed)
noted 

## 2021-08-20 NOTE — Telephone Encounter (Signed)
Called patient and no answer. Voicemail was left with office call back number.   

## 2021-09-09 ENCOUNTER — Other Ambulatory Visit: Payer: Self-pay | Admitting: Orthopedic Surgery

## 2021-09-09 ENCOUNTER — Encounter (HOSPITAL_COMMUNITY): Payer: Medicare HMO | Admitting: Internal Medicine

## 2021-09-10 ENCOUNTER — Ambulatory Visit: Payer: Medicare HMO

## 2021-09-15 ENCOUNTER — Other Ambulatory Visit: Payer: Self-pay

## 2021-09-15 ENCOUNTER — Ambulatory Visit (INDEPENDENT_AMBULATORY_CARE_PROVIDER_SITE_OTHER): Payer: Medicare HMO

## 2021-09-15 DIAGNOSIS — I4892 Unspecified atrial flutter: Secondary | ICD-10-CM

## 2021-09-15 DIAGNOSIS — Z5181 Encounter for therapeutic drug level monitoring: Secondary | ICD-10-CM

## 2021-09-15 DIAGNOSIS — I8290 Acute embolism and thrombosis of unspecified vein: Secondary | ICD-10-CM

## 2021-09-15 DIAGNOSIS — I4891 Unspecified atrial fibrillation: Secondary | ICD-10-CM

## 2021-09-15 DIAGNOSIS — I513 Intracardiac thrombosis, not elsewhere classified: Secondary | ICD-10-CM | POA: Diagnosis not present

## 2021-09-15 DIAGNOSIS — I483 Typical atrial flutter: Secondary | ICD-10-CM

## 2021-09-15 LAB — POCT INR: INR: 2.9 (ref 2.0–3.0)

## 2021-09-15 NOTE — Patient Instructions (Signed)
Continue taking Warfarin 1 tablet daily except 1/2 tablet on Tuesdays and Saturdays. Recheck INR in 5 weeks. Main # [phone] [facility] [phone].

## 2021-09-15 NOTE — Progress Notes (Signed)
Pt presents to the office today w/ his daughter and his 71 y/o granddaughter.  Pt is 14 mins late today. Pt has a h/o being late to anti-coag appts (was not seen last week 2/2 being > 1 hr late).  On calling pt from the waiting room, he did not acknowledge that I had called his name, but daughter did, so I waited on her to determine who the pt was, as I have never met him before.  When going thru the door from the [facility], I asked pt how he was doing, and he said "whatever" as he passed.  As we were proceeding down the hall, he asked me repeatedly if I knew what "Sloppy [name]" was up to and seemed mad that I did not know what he was referring to.  Once in the exam room, pt would not answer my questions pertaining to his warfarin - he was cursing profusely to the point that his daughter and granddaughter left the room.  She stated, "there's no need for your to cuss in here", to which he replied. "Why not, they hear it everywhere else".  She then stated, "that's why we're leaving" and she stepped out.  Pt's INR was therapeutic, but he stated that he had missed numerous doses and "compensated" for them, so I "was not doing my job by not making adjustments to his dosage."  His daughter stepped back in to make the appt, to which pt cursed at her repeatedly in regards to the time.  The pt's granddaughter coughed (she was not wearing a mask); I asked if she was ok and pt stated that "she's developing her immune system - you should let your cough on you".   The daughter left again to take the granddaughter to the bathroom.  While they were out of the room, pt continued to swear and asked me about the discovery of the ark in [place] and how it got there, what I thought of the President stopping the nation's ability to fix the roads, and why [facility] keeps spending money on needless frivolities instead of investing it back in pt care.  I did not engage w/ pt in any of these conversations, as his order had run out and I was trying  to put in a new one to lock in his appt time.  I asked pt if he wanted a printout for today's visit and he said yes b/c he wanted proof of our conversation.  When I handed it to him, he stated that it was a waste of paper and he can't believe I made him take it.  I walked away to find his daughter and pt continued to stay in the room alone, cursing,  and stating that we are "just a production line and need to get the next one in and out".  Once his daughter came out of the bathroom and I told her that he was ready to go and she she escorted him out, apologizing for his behavior.

## 2021-10-01 ENCOUNTER — Other Ambulatory Visit: Payer: Self-pay

## 2021-10-01 ENCOUNTER — Encounter: Payer: Self-pay | Admitting: Adult Health

## 2021-10-01 ENCOUNTER — Ambulatory Visit (INDEPENDENT_AMBULATORY_CARE_PROVIDER_SITE_OTHER): Payer: Medicare HMO | Admitting: Adult Health

## 2021-10-01 VITALS — BP 138/88 | HR 100 | Temp 98.6°F

## 2021-10-01 DIAGNOSIS — N1832 Chronic kidney disease, stage 3b: Secondary | ICD-10-CM | POA: Diagnosis not present

## 2021-10-01 DIAGNOSIS — Z9114 Patient's other noncompliance with medication regimen: Secondary | ICD-10-CM

## 2021-10-01 DIAGNOSIS — E1122 Type 2 diabetes mellitus with diabetic chronic kidney disease: Secondary | ICD-10-CM

## 2021-10-01 DIAGNOSIS — E108 Type 1 diabetes mellitus with unspecified complications: Secondary | ICD-10-CM

## 2021-10-01 DIAGNOSIS — Z794 Long term (current) use of insulin: Secondary | ICD-10-CM | POA: Diagnosis not present

## 2021-10-01 DIAGNOSIS — Z91148 Patient's other noncompliance with medication regimen for other reason: Secondary | ICD-10-CM

## 2021-10-01 MED ORDER — BLOOD GLUCOSE METER KIT: PACK | 0 refills | Status: DC

## 2021-10-01 NOTE — Patient Instructions (Signed)
Insulin Injection Instructions, Single Insulin Dose, Adult There are many different types of insulin. The type of insulin that you take may determine how many injections you give yourself and when you need to give the injections. Supplies needed: Soap and water. Insulin medicine in small bottles (vials). A new, unused insulin syringe. Alcohol wipes. A disposal container for sharp items (sharps container), such as an empty plastic bottle with a cover. How to choose a site for injection The body absorbs insulin differently, depending on where the insulin is injected (injection site). It is best to inject insulin into the same body area each time; for example, always in the abdomen. However, you should use a different spot in that area for each injection. Do not inject the insulin in the same spot each time. There are five main areas that can be used for injecting. These areas are: Abdomen. This is the preferred area. Front of thigh. Upper, outer side of thigh. Upper, outer side of arm. Upper, outer part of buttock. How to give a single-dose insulin injection Get ready Wash your hands with soap and water for at least 20 seconds. If soap and water are not available, use hand sanitizer. Test your blood sugar (glucose) level and write down that number. Follow any instructions from your health care provider about what to do if your blood glucose level is higher or lower than your normal range. Make sure the vial you are using has the right kind of insulin and there is enough insulin for your dose. Check the expiration date. Check to make sure you have the correct type of insulin syringe for the concentration of insulin that you are using. Use a new, unused insulin syringe each time you need to inject insulin. If you are using CLEAR insulin, check to see that it is clear and free of clumps. If you are using CLOUDY insulin, mix it by gently rolling the insulin vial between your palms several times. Do  not shake the vial. Remove the plastic pop-top covering from the vial of insulin. This type of covering is present on a vial when it is new. Use an alcohol wipe to clean the rubber top of the vial. Remove the plastic cover from the syringe needle. Do not let the needle touch anything. Push air into the vial To bring (draw up) air into the syringe, slowly pull back on the syringe plunger. Stop pulling the plunger when the dose indicator gets to the number of units that you will be using. While you keep the vial right-side-up, poke the needle through the rubber top of the vial. Do not turn the vial upside down to do this. Push the plunger all the way into the syringe. Doing that will push air into the vial. Do not take the needle out of the vial yet. Fill the syringe  While the needle is still in the vial, turn the vial upside down and hold it at eye level. Slowly pull back on the plunger. Stop pulling the plunger when the dose indicator gets to the desired number of units. If you see air bubbles in the syringe, slowly move the plunger up and down 2 or 3 times to make them go away. If you had to move the plunger to get rid of air bubbles, pull back the plunger until the dose indicator returns to the correct dose. Remove the needle from the vial. Do not let the needle touch anything. Inject the insulin  Use an alcohol wipe to  clean the site where you will be inserting the needle. Let the site air-dry. Hold the syringe in your writing hand like a pencil. If directed by your health care provider, use your other hand to pinch and hold about an inch (2.5 cm) of skin at the injection site. Do not directly touch the cleaned part of the skin. Gently but quickly, put the needle straight into the skin. The needle should be at a 45-degree angle or a 90-degree angle (perpendicular) to the skin, as told by your health care provider. Push the needle in as far as it will go (to the hub). When the needle is  completely inserted into the skin, let go of the skin that you are pinching. Continue to hold the syringe in place with your writing hand. Use your thumb or index finger of your writing hand to push the plunger all the way into the syringe to inject the insulin. Wait 5-10 seconds, then pull the needle straight out of the skin. This will allow all of the insulin to go from the syringe and needle into your body. If bleeding occurs, press and hold gauze over the injection site until any bleeding stops. Do not rub the area. Do not put the plastic cover back on the needle. Discard the syringe and needle directly into a sharps container. How to throw away supplies Discard all used needles in a sharps container. Follow the disposal regulations for the area where you live. Do not use any syringe or needle more than one time. Throw away empty vials in the regular trash. Questions to ask your health care provider How often should I be taking insulin? How often should I check my blood glucose? What amount of insulin should I be taking at each time? What are the side effects? What should I do if my blood glucose is too high? What should I do if my blood glucose is too low? What should I do if I forget to take my insulin? What number should I call if I have questions? Where to find more information American Diabetes Association (ADA): diabetes.org Association of Diabetes Care and Education Specialists (ADCES): diabeteseducator.org Summary Before you give yourself an insulin injection, be sure to wash your hands for at least 20 seconds and test your blood glucose level. Write down that number. Check the expiration date and the type of insulin that you are using. The type of insulin that you take may determine how many injections you give yourself and when you need to give the injections. It is best to inject insulin into the same body area each time; for example, always in the abdomen. However, you should  use a different spot in that area for each injection. Do not use an insulin syringe more than one time. This information is not intended to replace advice given to you by your health care provider. Make sure you discuss any questions you have with your health care provider. Document Revised: 01/13/2021 Document Reviewed: 08/24/2020 Elsevier Patient Education  2022 [employer].

## 2021-10-01 NOTE — Progress Notes (Signed)
Location:  [facility]   Place of Service:   clinic    CODE STATUS: full   No Known Allergies  Chief Complaint  Patient presents with   Acute Visit    Patient presents today for leg swelling.    HPI:  He tells me that he does not want to be here. He is here because his daughter "made him". He has chronic swelling in his legs. He wants his lipitor stopped; he is not taking this medication. He needs a glucose machine as he cannot use CBG monitoring due to finances. He denies any pain. He has not been checking his glucose readings.   Past Medical History:  Diagnosis Date   A-fib [facility])    a. (06/04/14) TEE-DC-CV; succesful; large LA 6.2 cm   ADHD    Ascites    Athlete's foot    CHF (congestive heart failure) (HCC)    Chronic systolic heart failure (HCC)    a. 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, unspecified CKD stage, unspecified whether long term insulin use (HCC)    Heart murmur    HTN (hypertension)    OSA (obstructive sleep apnea)     Past Surgical History:  Procedure Laterality Date   ABDOMINAL AORTOGRAM W/LOWER EXTREMITY N/A 05/19/2021   Procedure: ABDOMINAL AORTOGRAM W/LOWER EXTREMITY;  Surgeon: Cephus Shelling, MD;  Location: [facility] INVASIVE CV LAB;  Service: Cardiovascular;  Laterality: N/A;   AMPUTATION Left 05/22/2021   Procedure: LEFT FIFTH TOE AMPUTATION;  Surgeon: Cephus Shelling, MD;  Location: [facility] OR;  Service: Vascular;  Laterality: Left;   CARDIOVERSION N/A 06/04/2014   Procedure: CARDIOVERSION;  Surgeon: Laurey Morale, MD;  Location: [facility] ENDOSCOPY;  Service: Cardiovascular;  Laterality: N/A;   NOSE SURGERY     Nasal septum surgery   PERIPHERAL VASCULAR INTERVENTION Left 05/19/2021   Procedure: PERIPHERAL VASCULAR INTERVENTION;  Surgeon:  Cephus Shelling, MD;  Location: [facility] INVASIVE CV LAB;  Service: Cardiovascular;  Laterality: Left;   TEE WITHOUT CARDIOVERSION N/A 06/04/2014   Procedure: TRANSESOPHAGEAL ECHOCARDIOGRAM (TEE);  Surgeon: Laurey Morale, MD;  Location: [facility] ENDOSCOPY;  Service: Cardiovascular;  Laterality: N/A;   TEE WITHOUT CARDIOVERSION N/A 01/06/2016   Procedure: TRANSESOPHAGEAL ECHOCARDIOGRAM (TEE);  Surgeon: Pricilla Riffle, MD;  Location: [facility] ENDOSCOPY;  Service: Cardiovascular;  Laterality: N/A;   VASECTOMY      Social History   Socioeconomic History   Marital status: Single    Spouse name: Not on file   Number of children: Not on file   Years of education: Not on file   Highest education level: Not on file  Occupational History   Not on file  Tobacco Use   Smoking status: Former    Types: Cigarettes   Smokeless tobacco: Never  Substance and Sexual Activity   Alcohol use: Not Currently   Drug use: Never   Sexual activity: Not on file  Other Topics Concern   Not on file  Social History Narrative   ** Merged History Encounter ** Lives in [place] by himself. Retired from [employer] for [employer].       Tobacco use, amount per day now:   Past tobacco use, amount per day:   How many years did you use tobacco:  Alcohol use (drinks per week): N/A   Diet:   Do you drink/eat things with caffeine:   Marital status:  Divorced                                What year were you married? 2003   Do you live in a house, apartment, assisted living, condo, trailer, etc.? House   Is it one or more stories? One   How many persons live in your home?   Do you have pets in your home?( please list) N/A   Highest Level of education completed? Bachelors Degree   Current or past profession: Teacher-Special Ed   Do you exercise?  Yes                                Type and how often? Daily squats, and push ups.   Do you have a living will? No   Do you have a DNR form?  No                                  If not, do you want to discuss one?   Do you have signed POA/HPOA forms?  No                      If so, please bring to you appointment      Do you have any difficulty bathing or dressing yourself? Bathing only if pants not shirts/not shorts.   Do you have any difficulty preparing food or eating? No   Do you have any difficulty managing your medications? No   Do you have any difficulty managing your finances? No   Do you have any difficulty affording your medications? No   Social Determinants of Radio broadcast assistant Strain: Not on file  Food Insecurity: Not on file  Transportation Needs: Not on file  Physical Activity: Not on file  Stress: Not on file  Social Connections: Not on file  Intimate Partner Violence: Not on file   Family History  Problem Relation Age of Onset   Heart attack Mother        deceased   Diabetes Mother    Heart attack Sister       VITAL SIGNS BP 138/88   Pulse 100   Temp 98.6 F (37 C)   SpO2 97%   Outpatient Encounter Medications as of 10/01/2021  Medication Sig   furosemide (LASIX) 20 MG tablet Take 1 tablet (20 mg total) by mouth 2 (two) times daily.   metoprolol succinate (TOPROL-XL) 100 MG 24 hr tablet TAKE 1 TABLET TWICE DAILY   warfarin (COUMADIN) 6 MG tablet Take 1 tablet (6 mg total) by mouth daily. Take 2-1 tablets daily.   atorvastatin (LIPITOR) 10 MG tablet Take 1 tablet (10 mg total) by mouth daily.   Continuous Blood Gluc Receiver (FREESTYLE LIBRE 2 READER) DEVI 1 application by Does not apply route 2 (two) times daily. (Patient not taking: Reported on 07/28/2021)   Continuous Blood Gluc Sensor (FREESTYLE LIBRE 2 SENSOR) MISC 1 application by Does not apply route 2 (two) times daily. (Patient not taking: Reported on 07/28/2021)   Cyanocobalamin (VITAMIN B-12 PO) Take 1 capsule by mouth daily.   diclofenac Sodium (VOLTAREN) 1 % GEL Apply 2  g topically 4 (four) times daily.   Ergocalciferol (VITAMIN D2 PO) Take 1  capsule by mouth daily.   insulin glargine (LANTUS) 100 UNIT/ML injection Inject 0.08 mLs (8 Units total) into the skin daily.   polyethylene glycol powder (GLYCOLAX/MIRALAX) 17 GM/SCOOP powder Take 17 g by mouth daily. Hold for loose stool   potassium chloride (KLOR-CON 10) 10 MEQ tablet Take 2 tablets (20 mEq total) by mouth 2 (two) times daily.   sertraline (ZOLOFT) 25 MG tablet Take 1 tablet (25 mg total) by mouth daily.   VITAMIN D PO Take 1 capsule by mouth daily. (Patient not taking: Reported on 07/28/2021)   Vitamin D, Ergocalciferol, (DRISDOL) 1.25 MG (50000 UNIT) CAPS capsule TAKE 1 CAPSULE BY MOUTH EVERY THURSDAY   [DISCONTINUED] furosemide (LASIX) 80 MG tablet TAKE 1 TABLET TWICE DAILY   No facility-administered encounter medications on file as of 10/01/2021.     SIGNIFICANT DIAGNOSTIC EXAMS  Review of Systems  Constitutional:  Negative for malaise/fatigue.  Respiratory:  Negative for cough and shortness of breath.   Cardiovascular:  Positive for leg swelling. Negative for chest pain and palpitations.  Gastrointestinal:  Negative for abdominal pain, constipation and heartburn.  Musculoskeletal:  Negative for back pain, joint pain and myalgias.  Skin: Negative.   Neurological:  Negative for dizziness.  Psychiatric/Behavioral:  The patient is not nervous/anxious.     Physical Exam Constitutional:      General: He is not in acute distress.    Appearance: He is well-developed. He is obese. He is not diaphoretic.  Neck:     Thyroid: No thyromegaly.  Cardiovascular:     Rate and Rhythm: Normal rate. Rhythm irregular.     Pulses: Normal pulses.     Heart sounds: Normal heart sounds.  Pulmonary:     Effort: Pulmonary effort is normal. No respiratory distress.     Breath sounds: Normal breath sounds.  Abdominal:     General: Bowel sounds are normal. There is no distension.     Palpations: Abdomen is soft.     Tenderness: There is no abdominal tenderness.  Musculoskeletal:         General: Normal range of motion.     Cervical back: Neck supple.     Right lower leg: Edema present.     Left lower leg: Edema present.  Lymphadenopathy:     Cervical: No cervical adenopathy.  Skin:    General: Skin is warm and dry.  Neurological:     Mental Status: He is alert and oriented to person, place, and time.  Psychiatric:        Mood and Affect: Mood normal.      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   2. Noncompliance with medications   Will stop lipitor due to his preference Will get him a glucose monitor and strips for him to better monitor his diabetes.  Will continue th rest of his medications.  Will have him return as previously scheduled.    Ok Edwards NP [facility]  Contact [phone] Monday through Friday 8am- 5pm  After hours call [phone]

## 2021-10-06 ENCOUNTER — Other Ambulatory Visit: Payer: Self-pay | Admitting: Adult Health

## 2021-10-06 DIAGNOSIS — E108 Type 1 diabetes mellitus with unspecified complications: Secondary | ICD-10-CM | POA: Insufficient documentation

## 2021-10-20 ENCOUNTER — Telehealth (HOSPITAL_COMMUNITY): Payer: Self-pay

## 2021-10-20 ENCOUNTER — Telehealth (HOSPITAL_COMMUNITY): Payer: Self-pay | Admitting: *Deleted

## 2021-10-20 NOTE — Telephone Encounter (Signed)
Patients daughter called back and confirmed that it was indeed the metoprolol that the patient had been missing. While on the phone patients daughter also reported that patients lower legs have been swelling and his weight is up about 20 pounds. Per Meredith Staggers, RN she states that the patient needs to take an extra 40mg  of lasix now along with his 20mg  evening dose of lasix and then in the morning we will call to check on him. Patients daughter was agreeable with plan.

## 2021-10-20 NOTE — Telephone Encounter (Signed)
Called pt and daughter to get more information. No answer/left vm for return call.   (Note from Karle Plumber, [facility] Patient called Friday and left VM. Says his daughter has been managing his meds for the last few months and they just realized they had accidentally deleted a medication. I couldn't understand him to know which one for sure, but based off the strength he provided, I think it was the metoprolol succinate 100 mg. He says now he has swelling in his lower legs up to his knees and wanted to know what to do. His daughters number is [phone].Marland Kitchen

## 2021-10-21 ENCOUNTER — Other Ambulatory Visit (HOSPITAL_COMMUNITY): Payer: Self-pay

## 2021-10-21 MED ORDER — FUROSEMIDE 20 MG PO TABS
20.0000 mg | ORAL_TABLET | Freq: Two times a day (BID) | ORAL | 3 refills | Status: DC
  Filled 2021-10-21: qty 60, 20d supply, fill #0

## 2021-10-21 NOTE — Telephone Encounter (Signed)
Spoke with pts daughter pts swelling is better and he feels better they asked if pt could tqake extra lasix today per Dinah Beers pt may take and extra 40mg  of lasix today and schedule an office visit. Daughter aware appt scheduled.

## 2021-10-21 NOTE — Addendum Note (Signed)
Addended by: Modesta Messing on: 10/21/2021 11:50 AM   Modules accepted: Orders

## 2021-10-22 ENCOUNTER — Other Ambulatory Visit: Payer: Self-pay

## 2021-10-22 ENCOUNTER — Ambulatory Visit (INDEPENDENT_AMBULATORY_CARE_PROVIDER_SITE_OTHER): Payer: Medicare HMO | Admitting: *Deleted

## 2021-10-22 DIAGNOSIS — I513 Intracardiac thrombosis, not elsewhere classified: Secondary | ICD-10-CM

## 2021-10-22 DIAGNOSIS — I4891 Unspecified atrial fibrillation: Secondary | ICD-10-CM | POA: Diagnosis not present

## 2021-10-22 DIAGNOSIS — I483 Typical atrial flutter: Secondary | ICD-10-CM

## 2021-10-22 DIAGNOSIS — Z5181 Encounter for therapeutic drug level monitoring: Secondary | ICD-10-CM | POA: Diagnosis not present

## 2021-10-22 LAB — POCT INR: INR: 4.7 — AB (ref 2.0–3.0)

## 2021-10-22 NOTE — Patient Instructions (Signed)
Description   Do not take any Warfarin tomorrow and no Warfarin Friday then continue taking Warfarin 1 tablet daily except 1/2 tablet on Tuesdays and Saturdays. Recheck INR in 3 weeks. Main # [phone] [facility] [phone].

## 2021-10-28 ENCOUNTER — Telehealth (HOSPITAL_COMMUNITY): Payer: Self-pay | Admitting: *Deleted

## 2021-10-28 ENCOUNTER — Inpatient Hospital Stay (HOSPITAL_COMMUNITY)
Admission: EM | Admit: 2021-10-28 | Discharge: 2021-11-10 | DRG: 291 | Disposition: A | Discharge status: Home / Self Care | Payer: Medicare HMO | Attending: Internal Medicine | Admitting: Internal Medicine

## 2021-10-28 ENCOUNTER — Emergency Department (HOSPITAL_COMMUNITY): Payer: Medicare HMO

## 2021-10-28 DIAGNOSIS — R600 Localized edema: Secondary | ICD-10-CM

## 2021-10-28 DIAGNOSIS — J9811 Atelectasis: Secondary | ICD-10-CM | POA: Diagnosis present

## 2021-10-28 DIAGNOSIS — Z20822 Contact with and (suspected) exposure to covid-19: Secondary | ICD-10-CM | POA: Diagnosis present

## 2021-10-28 DIAGNOSIS — N1831 Chronic kidney disease, stage 3a: Secondary | ICD-10-CM | POA: Diagnosis present

## 2021-10-28 DIAGNOSIS — E669 Obesity, unspecified: Secondary | ICD-10-CM | POA: Diagnosis present

## 2021-10-28 DIAGNOSIS — R0602 Shortness of breath: Secondary | ICD-10-CM | POA: Diagnosis not present

## 2021-10-28 DIAGNOSIS — Z86718 Personal history of other venous thrombosis and embolism: Secondary | ICD-10-CM

## 2021-10-28 DIAGNOSIS — I4891 Unspecified atrial fibrillation: Secondary | ICD-10-CM | POA: Diagnosis present

## 2021-10-28 DIAGNOSIS — L89311 Pressure ulcer of right buttock, stage 1: Secondary | ICD-10-CM | POA: Diagnosis present

## 2021-10-28 DIAGNOSIS — E876 Hypokalemia: Secondary | ICD-10-CM | POA: Diagnosis present

## 2021-10-28 DIAGNOSIS — I484 Atypical atrial flutter: Secondary | ICD-10-CM | POA: Diagnosis present

## 2021-10-28 DIAGNOSIS — G4733 Obstructive sleep apnea (adult) (pediatric): Secondary | ICD-10-CM | POA: Diagnosis present

## 2021-10-28 DIAGNOSIS — N189 Chronic kidney disease, unspecified: Secondary | ICD-10-CM | POA: Diagnosis present

## 2021-10-28 DIAGNOSIS — Z7901 Long term (current) use of anticoagulants: Secondary | ICD-10-CM

## 2021-10-28 DIAGNOSIS — Z28310 Unvaccinated for covid-19: Secondary | ICD-10-CM

## 2021-10-28 DIAGNOSIS — F17210 Nicotine dependence, cigarettes, uncomplicated: Secondary | ICD-10-CM | POA: Diagnosis present

## 2021-10-28 DIAGNOSIS — F32A Depression, unspecified: Secondary | ICD-10-CM | POA: Diagnosis present

## 2021-10-28 DIAGNOSIS — E1151 Type 2 diabetes mellitus with diabetic peripheral angiopathy without gangrene: Secondary | ICD-10-CM | POA: Diagnosis present

## 2021-10-28 DIAGNOSIS — F17290 Nicotine dependence, other tobacco product, uncomplicated: Secondary | ICD-10-CM | POA: Diagnosis present

## 2021-10-28 DIAGNOSIS — I4819 Other persistent atrial fibrillation: Secondary | ICD-10-CM | POA: Diagnosis present

## 2021-10-28 DIAGNOSIS — N179 Acute kidney failure, unspecified: Secondary | ICD-10-CM | POA: Diagnosis present

## 2021-10-28 DIAGNOSIS — L89321 Pressure ulcer of left buttock, stage 1: Secondary | ICD-10-CM | POA: Diagnosis present

## 2021-10-28 DIAGNOSIS — I130 Hypertensive heart and chronic kidney disease with heart failure and stage 1 through stage 4 chronic kidney disease, or unspecified chronic kidney disease: Secondary | ICD-10-CM | POA: Diagnosis present

## 2021-10-28 DIAGNOSIS — Z6835 Body mass index (BMI) 35.0-35.9, adult: Secondary | ICD-10-CM | POA: Diagnosis not present

## 2021-10-28 DIAGNOSIS — I495 Sick sinus syndrome: Secondary | ICD-10-CM | POA: Diagnosis not present

## 2021-10-28 DIAGNOSIS — I10 Essential (primary) hypertension: Secondary | ICD-10-CM | POA: Diagnosis not present

## 2021-10-28 DIAGNOSIS — E1122 Type 2 diabetes mellitus with diabetic chronic kidney disease: Secondary | ICD-10-CM | POA: Diagnosis present

## 2021-10-28 DIAGNOSIS — D72829 Elevated white blood cell count, unspecified: Secondary | ICD-10-CM | POA: Diagnosis present

## 2021-10-28 DIAGNOSIS — Z833 Family history of diabetes mellitus: Secondary | ICD-10-CM | POA: Diagnosis not present

## 2021-10-28 DIAGNOSIS — I714 Abdominal aortic aneurysm, without rupture, unspecified: Secondary | ICD-10-CM | POA: Diagnosis present

## 2021-10-28 DIAGNOSIS — L8990 Pressure ulcer of unspecified site, unspecified stage: Secondary | ICD-10-CM | POA: Insufficient documentation

## 2021-10-28 DIAGNOSIS — Z89422 Acquired absence of other left toe(s): Secondary | ICD-10-CM

## 2021-10-28 DIAGNOSIS — L0211 Cutaneous abscess of neck: Secondary | ICD-10-CM | POA: Diagnosis not present

## 2021-10-28 DIAGNOSIS — M7989 Other specified soft tissue disorders: Secondary | ICD-10-CM | POA: Diagnosis not present

## 2021-10-28 DIAGNOSIS — Z79899 Other long term (current) drug therapy: Secondary | ICD-10-CM

## 2021-10-28 DIAGNOSIS — Z8249 Family history of ischemic heart disease and other diseases of the circulatory system: Secondary | ICD-10-CM

## 2021-10-28 DIAGNOSIS — H547 Unspecified visual loss: Secondary | ICD-10-CM | POA: Diagnosis present

## 2021-10-28 DIAGNOSIS — Z9114 Patient's other noncompliance with medication regimen: Secondary | ICD-10-CM

## 2021-10-28 DIAGNOSIS — I5021 Acute systolic (congestive) heart failure: Secondary | ICD-10-CM | POA: Diagnosis not present

## 2021-10-28 DIAGNOSIS — N183 Chronic kidney disease, stage 3 unspecified: Secondary | ICD-10-CM

## 2021-10-28 DIAGNOSIS — I5043 Acute on chronic combined systolic (congestive) and diastolic (congestive) heart failure: Secondary | ICD-10-CM | POA: Diagnosis present

## 2021-10-28 DIAGNOSIS — R001 Bradycardia, unspecified: Secondary | ICD-10-CM | POA: Diagnosis not present

## 2021-10-28 DIAGNOSIS — F909 Attention-deficit hyperactivity disorder, unspecified type: Secondary | ICD-10-CM | POA: Diagnosis present

## 2021-10-28 DIAGNOSIS — R0902 Hypoxemia: Secondary | ICD-10-CM | POA: Diagnosis not present

## 2021-10-28 DIAGNOSIS — R42 Dizziness and giddiness: Secondary | ICD-10-CM | POA: Diagnosis not present

## 2021-10-28 DIAGNOSIS — Z794 Long term (current) use of insulin: Secondary | ICD-10-CM

## 2021-10-28 HISTORY — DX: Peripheral vascular disease, unspecified: I73.9

## 2021-10-28 LAB — CBC WITH DIFFERENTIAL/PLATELET
Abs Immature Granulocytes: 0.02 10*3/uL (ref 0.00–0.07)
Basophils Absolute: 0 10*3/uL (ref 0.0–0.1)
Basophils Relative: 1 %
Eosinophils Absolute: 0.3 10*3/uL (ref 0.0–0.5)
Eosinophils Relative: 4 %
HCT: 39.3 % (ref 39.0–52.0)
Hemoglobin: 11.6 g/dL — ABNORMAL LOW (ref 13.0–17.0)
Immature Granulocytes: 0 %
Lymphocytes Relative: 15 %
Lymphs Abs: 1 10*3/uL (ref 0.7–4.0)
MCH: 28 pg (ref 26.0–34.0)
MCHC: 29.5 g/dL — ABNORMAL LOW (ref 30.0–36.0)
MCV: 94.9 fL (ref 80.0–100.0)
Monocytes Absolute: 0.6 10*3/uL (ref 0.1–1.0)
Monocytes Relative: 9 %
Neutro Abs: 4.4 10*3/uL (ref 1.7–7.7)
Neutrophils Relative %: 71 %
Platelets: 166 10*3/uL (ref 150–400)
RBC: 4.14 MIL/uL — ABNORMAL LOW (ref 4.22–5.81)
RDW: 16.2 % — ABNORMAL HIGH (ref 11.5–15.5)
WBC: 6.2 10*3/uL (ref 4.0–10.5)
nRBC: 0 % (ref 0.0–0.2)

## 2021-10-28 LAB — COMPREHENSIVE METABOLIC PANEL
ALT: 35 U/L (ref 0–44)
AST: 23 U/L (ref 15–41)
Albumin: 3.6 g/dL (ref 3.5–5.0)
Alkaline Phosphatase: 41 U/L (ref 38–126)
Anion gap: 6 (ref 5–15)
BUN: 28 mg/dL — ABNORMAL HIGH (ref 8–23)
CO2: 28 mmol/L (ref 22–32)
Calcium: 9 mg/dL (ref 8.9–10.3)
Chloride: 106 mmol/L (ref 98–111)
Creatinine, Ser: 1.5 mg/dL — ABNORMAL HIGH (ref 0.61–1.24)
GFR, Estimated: 49 mL/min — ABNORMAL LOW (ref >60)
Glucose, Bld: 206 mg/dL — ABNORMAL HIGH (ref 70–99)
Potassium: 4.3 mmol/L (ref 3.5–5.1)
Sodium: 140 mmol/L (ref 135–145)
Total Bilirubin: 1.1 mg/dL (ref 0.3–1.2)
Total Protein: 7.3 g/dL (ref 6.5–8.1)

## 2021-10-28 LAB — PROTIME-INR
INR: 1.9 — ABNORMAL HIGH (ref 0.8–1.2)
Prothrombin Time: 22.1 seconds — ABNORMAL HIGH (ref 11.4–15.2)

## 2021-10-28 LAB — BRAIN NATRIURETIC PEPTIDE: B Natriuretic Peptide: 1165.7 pg/mL — ABNORMAL HIGH (ref 0.0–100.0)

## 2021-10-28 NOTE — ED Provider Notes (Signed)
Emergency Medicine Provider Triage Evaluation Note  [name] , a 70 y.o. male  was evaluated in triage.  Pt complains of worsening CHF.  States that he has had increasing leg swelling, weight gain of about 40 pounds over the past month, and shortness of breath with exertion.  He has been in communication with his cardiologist.  He has recently increased Lasix however this has not been helping.  No fevers.  Patient takes Coumadin.  Review of Systems  Positive: Lower extremity swelling, shortness of breath Negative: Fever  Physical Exam  BP (!) 173/76    Pulse 60    Temp 98 F (36.7 C)    Resp 16    SpO2 96%  Gen:   Awake, no distress   Resp:  Normal effort  MSK:   Moves extremities without difficulty  Other:  Bilateral lower extremity edema  Medical Decision Making  Medically screening exam initiated at 9:02 PM.  Appropriate orders placed.  [name] was informed that the remainder of the evaluation will be completed by another provider, this initial triage assessment does not replace that evaluation, and the importance of remaining in the ED until their evaluation is complete.     Renne Crigler, PA-C 10/28/21 2103    Glendora Score, MD 10/28/21 [phone]

## 2021-10-28 NOTE — ED Triage Notes (Signed)
Pt c/o leg swelling, SHOB, advises he's gained approx 35-40lbs over a month. Hx of CHF, CKD, Afib, HTN, compliant w all medications

## 2021-10-28 NOTE — Telephone Encounter (Signed)
Pt left vm on pharmacy line "Patient says he is up another 19 lbs since calling a few weeks and taking an extra Lasix. He is now up 39 lbs in ~1 month. Wants to know next steps." Per Shanda Bumps [place],FNP if pts weight is truly up that much he needs to go to the emergency room. Spoke with pt and caregiver pt is short of breath and weight is up 39lbs. Advised pt to go to the emergency room. Pt agreeable.

## 2021-10-29 ENCOUNTER — Other Ambulatory Visit (HOSPITAL_COMMUNITY): Payer: Self-pay

## 2021-10-29 ENCOUNTER — Observation Stay (HOSPITAL_BASED_OUTPATIENT_CLINIC_OR_DEPARTMENT_OTHER): Payer: Medicare HMO

## 2021-10-29 ENCOUNTER — Encounter (HOSPITAL_COMMUNITY): Payer: Self-pay | Admitting: Internal Medicine

## 2021-10-29 ENCOUNTER — Emergency Department (HOSPITAL_COMMUNITY): Payer: Medicare HMO

## 2021-10-29 DIAGNOSIS — I5021 Acute systolic (congestive) heart failure: Secondary | ICD-10-CM

## 2021-10-29 DIAGNOSIS — I5043 Acute on chronic combined systolic (congestive) and diastolic (congestive) heart failure: Secondary | ICD-10-CM | POA: Diagnosis present

## 2021-10-29 LAB — MAGNESIUM: Magnesium: 2.4 mg/dL (ref 1.7–2.4)

## 2021-10-29 LAB — ECHOCARDIOGRAM COMPLETE
Area-P 1/2: 3.6 cm2
S' Lateral: 3.4 cm
Single Plane A2C EF: 54.6 %

## 2021-10-29 LAB — RESP PANEL BY RT-PCR (FLU A&B, COVID) ARPGX2
Influenza A by PCR: NEGATIVE
Influenza B by PCR: NEGATIVE
SARS Coronavirus 2 by RT PCR: NEGATIVE

## 2021-10-29 LAB — GLUCOSE, CAPILLARY
Glucose-Capillary: 133 mg/dL — ABNORMAL HIGH (ref 70–99)
Glucose-Capillary: 194 mg/dL — ABNORMAL HIGH (ref 70–99)
Glucose-Capillary: 161 mg/dL — ABNORMAL HIGH (ref 70–99)

## 2021-10-29 MED ORDER — ACETAMINOPHEN 650 MG RE SUPP: 650.0000 mg | Freq: Four times a day (QID) | RECTAL | Status: DC | PRN

## 2021-10-29 MED ORDER — TRAZODONE HCL 50 MG PO TABS: 25.0000 mg | ORAL_TABLET | Freq: Every evening | ORAL | Status: DC | PRN

## 2021-10-29 MED ORDER — FUROSEMIDE 10 MG/ML IJ SOLN
40.0000 mg | Freq: Two times a day (BID) | INTRAMUSCULAR | Status: DC
  Administered 2021-10-29 – 2021-10-30 (×2): 40 mg via INTRAVENOUS
  Filled 2021-10-29 (×2): qty 4

## 2021-10-29 MED ORDER — SERTRALINE HCL 25 MG PO TABS: 25.0000 mg | ORAL_TABLET | Freq: Every day | ORAL | Status: DC

## 2021-10-29 MED ORDER — INSULIN ASPART 100 UNIT/ML IJ SOLN
0.0000 [IU] | Freq: Three times a day (TID) | INTRAMUSCULAR | Status: DC
  Administered 2021-10-29: 17:00:00 3 [IU] via SUBCUTANEOUS
  Administered 2021-10-30: 12:00:00 2 [IU] via SUBCUTANEOUS
  Administered 2021-10-30 – 2021-10-31 (×2): 3 [IU] via SUBCUTANEOUS
  Administered 2021-10-31: 10:00:00 5 [IU] via SUBCUTANEOUS
  Administered 2021-10-31: 17:00:00 2 [IU] via SUBCUTANEOUS
  Administered 2021-11-01: 18:00:00 3 [IU] via SUBCUTANEOUS
  Administered 2021-11-01 (×2): 2 [IU] via SUBCUTANEOUS
  Administered 2021-11-02 (×2): 3 [IU] via SUBCUTANEOUS
  Administered 2021-11-02 – 2021-11-03 (×2): 2 [IU] via SUBCUTANEOUS
  Administered 2021-11-03 – 2021-11-04 (×2): 3 [IU] via SUBCUTANEOUS
  Administered 2021-11-04 – 2021-11-05 (×3): 5 [IU] via SUBCUTANEOUS
  Administered 2021-11-05 (×2): 3 [IU] via SUBCUTANEOUS
  Administered 2021-11-06 (×2): 2 [IU] via SUBCUTANEOUS
  Administered 2021-11-06: 12:00:00 3 [IU] via SUBCUTANEOUS
  Administered 2021-11-07: 17:00:00 1 [IU] via SUBCUTANEOUS
  Administered 2021-11-07: 14:00:00 3 [IU] via SUBCUTANEOUS
  Administered 2021-11-08 (×2): 2 [IU] via SUBCUTANEOUS
  Administered 2021-11-09: 5 [IU] via SUBCUTANEOUS
  Administered 2021-11-09: 2 [IU] via SUBCUTANEOUS
  Administered 2021-11-10 (×3): 3 [IU] via SUBCUTANEOUS

## 2021-10-29 MED ORDER — METOPROLOL SUCCINATE ER 100 MG PO TB24
100.0000 mg | ORAL_TABLET | Freq: Two times a day (BID) | ORAL | Status: DC
  Administered 2021-10-29 – 2021-11-06 (×15): 100 mg via ORAL
  Filled 2021-10-29 (×17): qty 4

## 2021-10-29 MED ORDER — ONDANSETRON HCL 4 MG/2ML IJ SOLN: 4.0000 mg | Freq: Four times a day (QID) | INTRAMUSCULAR | Status: DC | PRN

## 2021-10-29 MED ORDER — WARFARIN - PHARMACIST DOSING INPATIENT
Freq: Every day | Status: DC
  Administered 2021-11-05 – 2021-11-10 (×3): 1

## 2021-10-29 MED ORDER — POLYETHYLENE GLYCOL 3350 17 GM/SCOOP PO POWD: 17.0000 g | Freq: Every day | ORAL | Status: DC

## 2021-10-29 MED ORDER — SODIUM CHLORIDE 0.9% FLUSH
3.0000 mL | Freq: Two times a day (BID) | INTRAVENOUS | Status: DC
  Administered 2021-10-29 – 2021-11-10 (×21): 3 mL via INTRAVENOUS

## 2021-10-29 MED ORDER — ONDANSETRON HCL 4 MG PO TABS: 4.0000 mg | ORAL_TABLET | Freq: Four times a day (QID) | ORAL | Status: DC | PRN

## 2021-10-29 MED ORDER — POLYETHYLENE GLYCOL 3350 17 G PO PACK: 17.0000 g | PACK | Freq: Every day | ORAL | Status: DC | PRN

## 2021-10-29 MED ORDER — INSULIN ASPART 100 UNIT/ML IJ SOLN
0.0000 [IU] | Freq: Every day | INTRAMUSCULAR | Status: DC
  Administered 2021-11-06: 22:00:00 2 [IU] via SUBCUTANEOUS

## 2021-10-29 MED ORDER — INSULIN GLARGINE-YFGN 100 UNIT/ML ~~LOC~~ SOLN
8.0000 [IU] | Freq: Every day | SUBCUTANEOUS | Status: DC
  Administered 2021-10-29 – 2021-11-03 (×6): 8 [IU] via SUBCUTANEOUS
  Filled 2021-10-29 (×7): qty 0.08

## 2021-10-29 MED ORDER — TRIAMCINOLONE 0.1 % CREAM:EUCERIN CREAM 1:1
TOPICAL_CREAM | Freq: Two times a day (BID) | CUTANEOUS | Status: DC
  Administered 2021-11-07 – 2021-11-09 (×3): 1 via TOPICAL
  Filled 2021-10-29 (×3): qty 1

## 2021-10-29 MED ORDER — ACETAMINOPHEN 325 MG PO TABS: 650.0000 mg | ORAL_TABLET | Freq: Four times a day (QID) | ORAL | Status: DC | PRN

## 2021-10-29 MED ORDER — WARFARIN SODIUM 2 MG PO TABS
4.0000 mg | ORAL_TABLET | Freq: Once | ORAL | Status: AC
  Administered 2021-10-29: 17:00:00 4 mg via ORAL
  Filled 2021-10-29: qty 2

## 2021-10-29 MED ORDER — DOCUSATE SODIUM 100 MG PO CAPS
100.0000 mg | ORAL_CAPSULE | Freq: Two times a day (BID) | ORAL | Status: DC
  Administered 2021-11-02 – 2021-11-09 (×7): 100 mg via ORAL
  Filled 2021-10-29 (×17): qty 1

## 2021-10-29 MED ORDER — BISACODYL 5 MG PO TBEC: 5.0000 mg | DELAYED_RELEASE_TABLET | Freq: Every day | ORAL | Status: DC | PRN

## 2021-10-29 MED ORDER — FUROSEMIDE 10 MG/ML IJ SOLN
40.0000 mg | Freq: Once | INTRAMUSCULAR | Status: AC
  Administered 2021-10-29: 09:00:00 40 mg via INTRAVENOUS
  Filled 2021-10-29: qty 4

## 2021-10-29 MED ORDER — HYDRALAZINE HCL 20 MG/ML IJ SOLN: 5.0000 mg | INTRAMUSCULAR | Status: DC | PRN

## 2021-10-29 NOTE — Progress Notes (Signed)
Heart Failure Navigator Progress Note  Assessed for Heart & Vascular TOC clinic readiness.  Patient does not meet criteria due to prior to hospitalization pt established with AHF clinic.   Navigator available for educational resources.    Ozella Rocks, MSN, RN Heart Failure Nurse Navigator [phone]

## 2021-10-29 NOTE — ED Provider Notes (Signed)
[facility] EMERGENCY DEPARTMENT Provider Note   CSN: [id number] Arrival date & time: 10/28/21  1953     History Chief Complaint  Patient presents with   Leg Swelling    [name] is a 70 y.o. male.  HPI Patient presents for leg swelling, orthopnea, and exertional dyspnea.  He does have a history of CHF.  He previously had global hypokinesis with an EF of 15 to 20%.  On more recent echocardiogram, LVEF improved but he does have diastolic dysfunction.  He has been on a 20 mg dose of Lasix daily.  He endorses a weight gain of 40 pounds in the past month.  2 weeks ago, his daily dose of Lasix was increased to 20 mg twice daily.  He is on Coumadin.  He has been adherent to all medications.  Patient presents to the ED due to worsening symptoms of edema and shortness of breath.    Past Medical History:  Diagnosis Date   A-fib [facility])    a. (06/04/14) TEE-DC-CV; succesful; large LA 6.2 cm   ADHD    Ascites    Athlete's foot    Chronic systolic heart failure ([place])    a. 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, unspecified CKD stage, unspecified whether long term insulin use (HCC)    Heart murmur    HTN (hypertension)    OSA (obstructive sleep apnea)    PVD (peripheral vascular disease) ([place])    s/p great toe amputation    Patient Active Problem List   Diagnosis Date Noted   Acute on chronic combined systolic and diastolic CHF (congestive heart failure) ([place]) 10/29/2021   Type 1 diabetes mellitus with complications ([place]) 08/65/7846   Neurocognitive deficits 06/20/2021   Hepatitis C antibody test positive 06/19/2021   PVD (peripheral vascular disease) ([place]) 06/02/2021   Adjustment disorder, unspecified 05/24/2021   Toe necrosis ([place]) 05/16/2021    Acute-on-chronic kidney injury ([place]) 05/16/2021   CHF (congestive heart failure) ([place]) 05/16/2021   Atrial fibrillation ([place]) 05/16/2021   Neck abscess 05/14/2016   Palliative care encounter 03/18/2016   Atrial fibrillation with rapid ventricular response ([place]) 03/14/2016   Noncompliance with medication regimen 03/14/2016   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   Hypertensive heart disease with heart failure (HCC)    Elevated troponin    Atrial flutter ([place]) 12/28/2015   Encounter for therapeutic drug monitoring 05/24/2014   HTN (hypertension) 96/29/5284   Chronic systolic heart failure ([place]) 05/15/2014   Obstructive sleep apnea 05/13/2014   Swelling of lower extremity 05/09/2014   Depression 05/09/2014   Type 2 diabetes mellitus with stage 3 chronic kidney disease, with long-term current use of insulin ([place]) 05/09/2014   CKD (chronic kidney disease), stage III ([place]) 05/09/2014   Obesity (BMI 30-39.9) 05/09/2014   Ascites 05/09/2014    Past Surgical History:  Procedure Laterality Date   ABDOMINAL AORTOGRAM W/LOWER EXTREMITY N/A 05/19/2021   Procedure: ABDOMINAL AORTOGRAM W/LOWER EXTREMITY;  Surgeon: Marty Heck, MD;  Location: [facility];  Service: Cardiovascular;  Laterality: N/A;   AMPUTATION Left 05/22/2021   Procedure: LEFT FIFTH TOE AMPUTATION;  Surgeon: Marty Heck, MD;  Location: [facility];  Service: Vascular;  Laterality: Left;   CARDIOVERSION N/A 06/04/2014   Procedure: CARDIOVERSION;  Surgeon: Larey Dresser, MD;  Location: [facility];  Service: Cardiovascular;  Laterality: N/A;   NOSE SURGERY     Nasal septum surgery   PERIPHERAL VASCULAR INTERVENTION Left 05/19/2021   Procedure: PERIPHERAL VASCULAR INTERVENTION;  Surgeon: Marty Heck, MD;  Location: [facility];  Service: Cardiovascular;  Laterality: Left;   TEE WITHOUT CARDIOVERSION N/A 06/04/2014   Procedure: TRANSESOPHAGEAL ECHOCARDIOGRAM (TEE);  Surgeon: Larey Dresser, MD;   Location: [facility];  Service: Cardiovascular;  Laterality: N/A;   TEE WITHOUT CARDIOVERSION N/A 01/06/2016   Procedure: TRANSESOPHAGEAL ECHOCARDIOGRAM (TEE);  Surgeon: Fay Records, MD;  Location: [facility] ENDOSCOPY;  Service: Cardiovascular;  Laterality: N/A;   VASECTOMY         Family History  Problem Relation Age of Onset   Heart attack Mother        deceased   Diabetes Mother    Heart attack Sister     Social History   Tobacco Use   Smoking status: Some Days    Years: 52.00    Types: Cigarettes, Cigars    Last attempt to quit: 05/2021    Years since quitting: 0.4   Smokeless tobacco: Never  Substance Use Topics   Alcohol use: Not Currently   Drug use: Never    Home Medications Prior to Admission medications   Medication Sig Start Date End Date Taking? Authorizing Provider  Cyanocobalamin (VITAMIN B-12 PO) Take 1 capsule by mouth daily.   Yes [provider]  diclofenac Sodium (VOLTAREN) 1 % GEL Apply 2 g topically 4 (four) times daily. Patient taking differently: Apply 2 g topically 4 (four) times daily as needed (arthritis pain). 06/26/21  Yes Ngetich, Dinah C, NP  Ergocalciferol (VITAMIN D2 PO) Take 1 capsule by mouth daily.   Yes [provider]  furosemide (LASIX) 20 MG tablet Take 1 tablet (20 mg total) by mouth 2 (two) times daily. May take an additional 61m daily as needed. 10/21/21  Yes [place], JNew Square FNP  Green Tea, Camellia sinensis, (GREEN TEA EXTRACT PO) Take 1 Dose by mouth daily.   Yes [provider]  insulin glargine (LANTUS) 100 UNIT/ML injection Inject 0.08 mLs (8 Units total) into the skin daily. 06/26/21 10/29/21 Yes Ngetich, Dinah C, NP  KRILL OIL OMEGA-3 PO Take 1 tablet by mouth daily.   Yes [provider]  metoprolol succinate (TOPROL-XL) 100 MG 24 hr tablet TAKE 1 TABLET TWICE DAILY Patient taking differently: Take 100 mg by mouth daily. 08/11/21  Yes Bensimhon, DShaune Pascal MD  polyethylene glycol powder  (GLYCOLAX/MIRALAX) 17 GM/SCOOP powder Take 17 g by mouth daily. Hold for loose stool Patient taking differently: Take 17 g by mouth daily as needed for mild constipation. Hold for loose stool 06/26/21  Yes Ngetich, Dinah C, NP  potassium chloride (KLOR-CON 10) 10 MEQ tablet Take 2 tablets (20 mEq total) by mouth 2 (two) times daily. Patient taking differently: Take 10 mEq by mouth daily. 06/26/21 10/29/21 Yes Ngetich, Dinah C, NP  Sodium Hyaluronate, oral, (HYALURONIC ACID PO) Take 1 tablet by mouth daily.   Yes [provider]  Vitamin D, Ergocalciferol, (DRISDOL) 1.25 MG (50000 UNIT) CAPS capsule TAKE 1 CKokomoPatient taking differently: Take 50,000 Units by mouth once a week. 09/09/21  Yes Ngetich, Dinah C, NP  warfarin (COUMADIN) 6 MG tablet Take 1 tablet (6 mg total) by mouth daily. Take 2-1 tablets daily. Patient taking differently: Take 3-6 mg by mouth See admin  instructions. Take 35m on Tuesdays and Saturdays, then 659mon other days. 07/24/21  Yes Camnitz, WiOcie DoyneMD  blood glucose meter kit and supplies Dispense based on patient and insurance preference. Use up to four times daily as directed. (FOR ICD-10 E10.9, E11.9). 10/01/21   GrGerlene FeeNP  sertraline (ZOLOFT) 25 MG tablet Take 1 tablet (25 mg total) by mouth daily. Patient not taking: Reported on 10/29/2021 06/26/21 09/24/21  Ngetich, DiNelda BucksNP    Allergies    Patient has no known allergies.  Review of Systems   Review of Systems  Constitutional:  Positive for activity change and fatigue. Negative for chills and fever.  HENT:  Negative for congestion, ear pain and sore throat.   Eyes:  Negative for pain and visual disturbance.  Respiratory:  Positive for shortness of breath. Negative for cough.   Cardiovascular:  Positive for leg swelling. Negative for chest pain and palpitations.  Gastrointestinal:  Negative for abdominal pain, diarrhea, nausea and vomiting.  Genitourinary:  Negative  for dysuria, flank pain and hematuria.  Musculoskeletal:  Negative for arthralgias, back pain, myalgias and neck pain.  Skin:  Negative for color change and rash.  Neurological:  Negative for dizziness, seizures, syncope, weakness, light-headedness and numbness.  Hematological:  Bruises/bleeds easily (on warfarin).  Psychiatric/Behavioral:  Negative for confusion and decreased concentration.   All other systems reviewed and are negative.  Physical Exam Updated Vital Signs BP (!) 172/87 (BP Location: Left Arm)    Pulse 78    Temp 97.7 F (36.5 C) (Oral)    Resp 18    SpO2 (!) 89%   Physical Exam Vitals and nursing note reviewed.  Constitutional:      General: He is not in acute distress.    Appearance: Normal appearance. He is well-developed. He is not ill-appearing, toxic-appearing or diaphoretic.  HENT:     Head: Normocephalic and atraumatic.     Right Ear: External ear normal.     Left Ear: External ear normal.     Nose: Nose normal.     Mouth/Throat:     Mouth: Mucous membranes are moist.     Pharynx: Oropharynx is clear.  Eyes:     General: No scleral icterus.    Extraocular Movements: Extraocular movements intact.     Conjunctiva/sclera: Conjunctivae normal.  Cardiovascular:     Rate and Rhythm: Normal rate.     Heart sounds: No murmur heard. Pulmonary:     Effort: Pulmonary effort is normal. Tachypnea present. No respiratory distress.     Breath sounds: No stridor or decreased air movement. Examination of the right-middle field reveals rales. Examination of the left-middle field reveals rales. Examination of the right-lower field reveals rales. Examination of the left-lower field reveals rales. Rales present. No decreased breath sounds or wheezing.  Abdominal:     Palpations: Abdomen is soft.     Tenderness: There is no abdominal tenderness. There is no right CVA tenderness or left CVA tenderness.  Musculoskeletal:     Cervical back: Normal range of motion and neck  supple. No rigidity.     Right lower leg: Edema present.     Left lower leg: Edema present.  Skin:    General: Skin is warm and dry.     Capillary Refill: Capillary refill takes less than 2 seconds.     Coloration: Skin is not jaundiced or pale.  Neurological:     General: No focal deficit present.     Mental  Status: He is alert and oriented to person, place, and time.     Cranial Nerves: No cranial nerve deficit.     Sensory: No sensory deficit.     Motor: No weakness.     Coordination: Coordination normal.  Psychiatric:        Mood and Affect: Mood normal.        Behavior: Behavior normal.        Thought Content: Thought content normal.        Judgment: Judgment normal.    ED Results / Procedures / Treatments   Labs (all labs ordered are listed, but only abnormal results are displayed) Labs Reviewed  CBC WITH DIFFERENTIAL/PLATELET - Abnormal; Notable for the following components:      Result Value   RBC 4.14 (*)    Hemoglobin 11.6 (*)    MCHC 29.5 (*)    RDW 16.2 (*)    All other components within normal limits  COMPREHENSIVE METABOLIC PANEL - Abnormal; Notable for the following components:   Glucose, Bld 206 (*)    BUN 28 (*)    Creatinine, Ser 1.50 (*)    GFR, Estimated 49 (*)    All other components within normal limits  BRAIN NATRIURETIC PEPTIDE - Abnormal; Notable for the following components:   B Natriuretic Peptide 1,165.7 (*)    All other components within normal limits  PROTIME-INR - Abnormal; Notable for the following components:   Prothrombin Time 22.1 (*)    INR 1.9 (*)    All other components within normal limits  GLUCOSE, CAPILLARY - Abnormal; Notable for the following components:   Glucose-Capillary 133 (*)    All other components within normal limits  GLUCOSE, CAPILLARY - Abnormal; Notable for the following components:   Glucose-Capillary 194 (*)    All other components within normal limits  RESP PANEL BY RT-PCR (FLU A&B, COVID) ARPGX2  MAGNESIUM   BASIC METABOLIC PANEL  CBC  PROTIME-INR    EKG EKG Interpretation  Date/Time:  Tuesday October 28 2021 21:42:56 EST Ventricular Rate:  64 PR Interval:  178 QRS Duration: 94 QT Interval:  448 QTC Calculation: 462 R Axis:   87 Text Interpretation: Normal sinus rhythm Incomplete right bundle branch block Nonspecific T wave abnormality Abnormal ECG Confirmed by Godfrey Pick ([id number]) on 10/29/2021 8:14:30 AM  Radiology DG Chest 2 View  Result Date: 10/28/2021 CLINICAL DATA:  Shortness of breath and leg swelling EXAM: CHEST - 2 VIEW COMPARISON:  05/01/2016 FINDINGS: Cardiac shadow is enlarged but stable. Lungs are well aerated bilaterally. Minimal atelectatic changes are seen in the bases. No bony abnormality is noted. IMPRESSION: Mild bibasilar atelectasis. Electronically Signed   By: Inez Catalina M.D.   On: 10/28/2021 22:18   ECHOCARDIOGRAM COMPLETE  Result Date: 10/29/2021    ECHOCARDIOGRAM REPORT   Patient Name:   [name] Date of Exam: 10/29/2021 Medical Rec #:  [id number]         Height:       68.0 in Accession #:    [id number]        Weight:       202.2 lb Date of Birth:  [birth year]        BSA:          2.053 m Patient Age:    45 years          BP:           178/87 mmHg Patient Gender: M  HR:           61 bpm. Exam Location:  Inpatient Procedure: 2D Echo Indications:    acute systolic chf  History:        Patient has prior history of Echocardiogram examinations, most                 recent 08/13/2020. Chronic kidney disease, Arrythmias:Atrial                 Fibrillation; Risk Factors:Diabetes and Hypertension.  Sonographer:    Johny Chess RDCS Referring Phys: [place]  1. Left ventricular ejection fraction, by estimation, is 45 to 50%. The left ventricle has mildly decreased function. The left ventricle demonstrates regional wall motion abnormalities (see scoring diagram/findings for description). Left ventricular diastolic parameters are  consistent with Grade II diastolic dysfunction (pseudonormalization). Elevated left atrial pressure. There is mild global left ventricular hypokinesis, slightly worse in the basal and mid segments of the inferior and inferolateral walls.  2. Right ventricular systolic function is moderately reduced. The right ventricular size is moderately enlarged. There is severely elevated pulmonary artery systolic pressure. The estimated right ventricular systolic pressure is 83.6 mmHg.  3. Left atrial size was moderately dilated.  4. Right atrial size was moderately dilated.  5. The mitral valve is normal in structure. No evidence of mitral valve regurgitation.  6. Tricuspid valve regurgitation is mild to moderate.  7. The aortic valve is tricuspid. Aortic valve regurgitation is not visualized. No aortic stenosis is present.  8. The inferior vena cava is dilated in size with <50% respiratory variability, suggesting right atrial pressure of 15 mmHg. Comparison(s): Prior images reviewed side by side. The overall left ventricular function is worsened. The left ventricular diastolic function is unchanged. The left ventricular wall motion abnormalities are unchanged. The striking changes involve marked worsening of the pulmonary hypertension, tricuspid insufficiency and right ventricular dysfunction. FINDINGS  Left Ventricle: Left ventricular ejection fraction, by estimation, is 45 to 50%. The left ventricle has mildly decreased function. The left ventricle demonstrates regional wall motion abnormalities. The left ventricular internal cavity size was normal in size. There is no left ventricular hypertrophy. Left ventricular diastolic parameters are consistent with Grade II diastolic dysfunction (pseudonormalization). Elevated left atrial pressure.  LV Wall Scoring: The inferior wall and posterior wall are hypokinetic. There is mild global left ventricular hypokinesis, slightly worse in the basal and mid segments of the inferior and  inferolateral walls. Right Ventricle: The right ventricular size is moderately enlarged. No increase in right ventricular wall thickness. Right ventricular systolic function is moderately reduced. There is severely elevated pulmonary artery systolic pressure. The tricuspid regurgitant velocity is 4.17 m/s, and with an assumed right atrial pressure of 15 mmHg, the estimated right ventricular systolic pressure is 62.9 mmHg. Left Atrium: Left atrial size was moderately dilated. Right Atrium: Right atrial size was moderately dilated. Pericardium: There is no evidence of pericardial effusion. Mitral Valve: The mitral valve is normal in structure. No evidence of mitral valve regurgitation. Tricuspid Valve: The tricuspid valve is grossly normal. Tricuspid valve regurgitation is mild to moderate. Aortic Valve: The aortic valve is tricuspid. Aortic valve regurgitation is not visualized. No aortic stenosis is present. Pulmonic Valve: The pulmonic valve was normal in structure. Pulmonic valve regurgitation is not visualized. Aorta: The aortic root and ascending aorta are structurally normal, with no evidence of dilitation. Venous: The inferior vena cava is dilated in size with less than 50% respiratory variability, suggesting right atrial  pressure of 15 mmHg. IAS/Shunts: No atrial level shunt detected by color flow Doppler.  LEFT VENTRICLE PLAX 2D LVIDd:         5.40 cm      Diastology LVIDs:         3.40 cm      LV e' medial:    4.68 cm/s LV PW:         1.10 cm      LV E/e' medial:  18.8 LV IVS:        0.90 cm      LV e' lateral:   8.05 cm/s LVOT diam:     2.30 cm      LV E/e' lateral: 10.9 LV SV:         65 LV SV Index:   32 LVOT Area:     4.15 cm  LV Volumes (MOD) LV vol d, MOD A2C: 116.0 ml LV vol s, MOD A2C: 52.7 ml LV SV MOD A2C:     63.3 ml RIGHT VENTRICLE             IVC RV S prime:     12.00 cm/s  IVC diam: 2.50 cm TAPSE (M-mode): 1.7 cm LEFT ATRIUM             Index        RIGHT ATRIUM           Index LA diam:         4.80 cm 2.34 cm/m   RA Area:     21.50 cm LA Vol (A2C):   99.2 ml 48.31 ml/m  RA Volume:   57.30 ml  27.91 ml/m LA Vol (A4C):   81.8 ml 39.84 ml/m LA Biplane Vol: 90.6 ml 44.12 ml/m  AORTIC VALVE LVOT Vmax:   83.90 cm/s LVOT Vmean:  51.400 cm/s LVOT VTI:    0.156 m  AORTA Ao Root diam: 2.90 cm Ao Asc diam:  3.20 cm MITRAL VALVE               TRICUSPID VALVE MV Area (PHT): 3.60 cm    TR Peak grad:   69.6 mmHg MV Decel Time: 211 msec    TR Vmax:        417.00 cm/s MV E velocity: 87.90 cm/s MV A velocity: 28.40 cm/s  SHUNTS MV E/A ratio:  3.10        Systemic VTI:  0.16 m                            Systemic Diam: 2.30 cm Mihai Croitoru MD Electronically signed by Sanda Klein MD Signature Date/Time: 10/29/2021/3:59:18 PM    Final     Procedures Procedures   Medications Ordered in ED Medications  metoprolol succinate (TOPROL-XL) 24 hr tablet 100 mg (100 mg Oral Given 10/29/21 1209)  insulin glargine-yfgn (SEMGLEE) injection 8 Units (8 Units Subcutaneous Given 10/29/21 1359)  insulin aspart (novoLOG) injection 0-15 Units (3 Units Subcutaneous Given 10/29/21 1648)  furosemide (LASIX) injection 40 mg (has no administration in time range)  sodium chloride flush (NS) 0.9 % injection 3 mL (3 mLs Intravenous Given 10/29/21 1210)  acetaminophen (TYLENOL) tablet 650 mg (has no administration in time range)    Or  acetaminophen (TYLENOL) suppository 650 mg (has no administration in time range)  traZODone (DESYREL) tablet 25 mg (has no administration in time range)  docusate sodium (COLACE) capsule 100 mg (100 mg Oral Patient Refused/Not Given  10/29/21 1209)  polyethylene glycol (MIRALAX / GLYCOLAX) packet 17 g (has no administration in time range)  bisacodyl (DULCOLAX) EC tablet 5 mg (has no administration in time range)  ondansetron (ZOFRAN) tablet 4 mg (has no administration in time range)    Or  ondansetron (ZOFRAN) injection 4 mg (has no administration in time range)  hydrALAZINE (APRESOLINE)  injection 5 mg (has no administration in time range)  insulin aspart (novoLOG) injection 0-5 Units (has no administration in time range)  triamcinolone 0.1 % cream : eucerin cream, 1:1 ( Topical Patient Refused/Not Given 10/29/21 1325)  Warfarin - Pharmacist Dosing Inpatient ( Does not apply Canceled Entry 10/29/21 1740)  furosemide (LASIX) injection 40 mg (40 mg Intravenous Given 10/29/21 0850)  warfarin (COUMADIN) tablet 4 mg (4 mg Oral Given 10/29/21 1648)    ED Course  I have reviewed the triage vital signs and the nursing notes.  Pertinent labs & imaging results that were available during my care of the patient were reviewed by me and considered in my medical decision making (see chart for details).    MDM Rules/Calculators/A&P                         Patient presents for volume overload.  He has had weight gain, lower extremity edema, and shortness of breath.  These symptoms have been refractory to home Lasix.  Vital signs on arrival are notable for hypertension.  Patient is maintaining normal SPO2 on room air.  On exam, he does appear mildly dyspneic.  Bibasilar crackles are present on lung auscultation.  Chest x-ray and BNP are consistent with volume overload.  Creatinine is elevated above baseline.  40 mg of IV Lasix was given in the ED.  Patient was admitted for further management.  Final Clinical Impression(s) / ED Diagnoses Final diagnoses:  Bilateral lower extremity edema  Shortness of breath    Rx / DC Orders ED Discharge Orders     None        Godfrey Pick, MD 10/29/21 1930

## 2021-10-29 NOTE — Evaluation (Signed)
Physical Therapy Evaluation Patient Details Name: [name] MRN: [id number] DOB: Dec 01, 1950 Today's Date: 10/29/2021  History of Present Illness  Pt is a 70 y.o. male admitted 10/28/21 with edema, weight gain, SOB. Workup for CHF exacerbation, AKI. PMH includes CHF, HTN, CKD, DM; admission 05/2021 for LLE vascular intervention.   Clinical Impression  Pt presents with an overall decrease in functional mobility secondary to above. PTA, pt limited community ambulator with [facility]; lives with daughter who assists with ADLs and household tasks as needed. Today, pt ambulating short distance with SPC and intermittent min guard for balance. Pt tangential with speech requiring frequent redirection to current task and conversation topic. Pt would benefit from continued acute PT services to maximize functional mobility and independence prior to d/c home.      Recommendations for follow up therapy are one component of a multi-disciplinary discharge planning process, led by the attending physician.  Recommendations may be updated based on patient status, additional functional criteria and insurance authorization.  Follow Up Recommendations No PT follow up    Assistance Recommended at Discharge Intermittent Supervision/Assistance  Functional Status Assessment Patient has had a recent decline in their functional status and demonstrates the ability to make significant improvements in function in a reasonable and predictable amount of time.  Equipment Recommendations  None recommended by PT    Recommendations for Other Services       Precautions / Restrictions Precautions Precautions: Fall      Mobility  Bed Mobility Overal bed mobility: Needs Assistance Bed Mobility: Supine to Sit;Sit to Supine     Supine to sit: Min assist     General bed mobility comments: MinA for HHA to elevate trunk    Transfers Overall transfer level: Needs assistance Equipment used: None Transfers: Sit to/from  Stand Sit to Stand: Min guard           General transfer comment: preference to keep R knee extended, use of bed rail for LUE and swinging hips up to R; min guard for balance    Ambulation/Gait Ambulation/Gait assistance: Min guard;Supervision Gait Distance (Feet): 72 Feet Assistive device: Straight cane Gait Pattern/deviations: Step-through pattern;Decreased stride length;Trunk flexed Gait velocity: Decreased     General Gait Details: Slow, mostly steady gait with SPC; initial min guard progressing to supervision for safety  Stairs            Wheelchair Mobility    Modified Rankin (Stroke Patients Only)       Balance Overall balance assessment: Needs assistance   Sitting balance-Leahy Scale: Fair Sitting balance - Comments: requires assist to don socks   Standing balance support: No upper extremity supported;Single extremity supported;During functional activity Standing balance-Leahy Scale: Fair Standing balance comment: can static stand and take steps without UE support; preference for [facility]                             Pertinent Vitals/Pain Pain Assessment: No/denies pain    Home Living Family/patient expects to be discharged to:: Private residence Living Arrangements: Children Available Help at Discharge: Family;Available PRN/intermittently (near 24/7) Type of Home: House Home Access: Stairs to enter Entrance Stairs-Rails: Right Entrance Stairs-Number of Steps: 3   Home Layout: One level Home Equipment: Cane - single point Additional Comments: Lives with daughter and granddaughter    Prior Function Prior Level of Function : Independent/Modified Independent             Mobility Comments: Limited household  ambulator with SPC; requires rest breaks and increased time; denies falls. Does not drive ADLs Comments: Daughter assists with LB dressing due to prior R groin injury. Reports baseline decreased vision; does not wear glasses      Hand Dominance   Dominant Hand: Right    Extremity/Trunk Assessment   Upper Extremity Assessment Upper Extremity Assessment: Overall WFL for tasks assessed (h/o L thumb amputation)    Lower Extremity Assessment Lower Extremity Assessment: Generalized weakness;RLE deficits/detail;LLE deficits/detail RLE Deficits / Details: H/o R groin (vascular?) injury, reports "I can't lift that leg"; h/o R knee arthritic pain, painful ROM       Communication   Communication: No difficulties  Cognition Arousal/Alertness: Awake/alert Behavior During Therapy: WFL for tasks assessed/performed Overall Cognitive Status: No family/caregiver present to determine baseline cognitive functioning                                 General Comments: Tangential and verbose with speech, unrelated topics, requires frequent redirection to conversation        General Comments      Exercises     Assessment/Plan    PT Assessment Patient needs continued PT services  PT Problem List Decreased strength;Decreased activity tolerance;Decreased balance;Decreased mobility;Cardiopulmonary status limiting activity       PT Treatment Interventions DME instruction;Gait training;Stair training;Functional mobility training;Therapeutic activities;Therapeutic exercise;Balance training;Patient/family education    PT Goals (Current goals can be found in the Care Plan section)  Acute Rehab PT Goals Patient Stated Goal: return home PT Goal Formulation: With patient Time For Goal Achievement: 11/12/21 Potential to Achieve Goals: Good    Frequency Min 3X/week   Barriers to discharge        Co-evaluation               AM-PAC PT "6 Clicks" Mobility  Outcome Measure Help needed turning from your back to your side while in a flat bed without using bedrails?: A Little Help needed moving from lying on your back to sitting on the side of a flat bed without using bedrails?: A Little Help needed  moving to and from a bed to a chair (including a wheelchair)?: A Little Help needed standing up from a chair using your arms (e.g., wheelchair or bedside chair)?: A Little Help needed to walk in hospital room?: A Little Help needed climbing 3-5 steps with a railing? : A Little 6 Click Score: 18    End of Session   Activity Tolerance: Patient tolerated treatment well Patient left: in bed;with call bell/phone within reach;with bed alarm set Nurse Communication: Mobility status PT Visit Diagnosis: Other abnormalities of gait and mobility (R26.89)    Time: 1530-1605 PT Time Calculation (min) (ACUTE ONLY): 35 min   Charges:   PT Evaluation $PT Eval Moderate Complexity: [place], PT, DPT Acute Rehabilitation Services  Pager [phone] Office [place] 10/29/2021, 4:12 PM

## 2021-10-29 NOTE — Progress Notes (Signed)
°  Echocardiogram 2D Echocardiogram has been performed.  Delcie Roch 10/29/2021, 3:34 PM

## 2021-10-29 NOTE — Progress Notes (Signed)
ANTICOAGULATION CONSULT NOTE - Initial Consult  Pharmacy Consult for Coumadin Indication: history of atrial fibrillation on Couamdin prior to admission.  No Known Allergies  Patient Measurements:    Vital Signs: Temp: 98 F (36.7 C) (12/21 1156) Temp Source: Oral (12/21 1156) BP: 178/87 (12/21 1156) Pulse Rate: 76 (12/21 1156)  Labs: Recent Labs    10/28/21 2112  HGB 11.6*  HCT 39.3  PLT 166  LABPROT 22.1*  INR 1.9*  CREATININE 1.50*    CrCl cannot be calculated (Unknown ideal weight.).   Medical History: Past Medical History:  Diagnosis Date   A-fib [facility])    a. (06/04/14) TEE-DC-CV; succesful; large LA 6.2 cm   ADHD    Ascites    Athlete's foot    Chronic systolic heart failure ([place])    a. 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   Past Month   diclofenac Sodium (VOLTAREN) 1 % GEL Apply 2 g topically 4 (four) times daily. (Patient taking differently: Apply 2 g topically 4 (four) times daily as needed (arthritis pain).) 100 g 1 Past Month   Ergocalciferol (VITAMIN D2 PO) Take 1 capsule by mouth daily.   Past Month   furosemide (LASIX) 20 MG tablet Take 1 tablet (20 mg total) by mouth 2 (two) times daily. May take an additional 47m daily as needed. 60 tablet 3 10/28/2021   Green Tea, Camellia sinensis, (GREEN TEA EXTRACT PO) Take 1 Dose by mouth daily.   Past Week   insulin glargine (LANTUS) 100  UNIT/ML injection Inject 0.08 mLs (8 Units total) into the skin daily. 10 mL 1 10/28/2021   KRILL OIL OMEGA-3 PO Take 1 tablet by mouth daily.   Past Week   metoprolol succinate (TOPROL-XL) 100 MG 24 hr tablet TAKE 1 TABLET TWICE DAILY (Patient taking differently: Take 100 mg by mouth daily.) 180 tablet 0 10/28/2021 at 1100   polyethylene glycol powder (GLYCOLAX/MIRALAX) 17 GM/SCOOP powder Take 17 g by mouth daily. Hold for loose stool (Patient taking differently: Take 17 g by mouth daily as needed for mild constipation. Hold for loose stool) 3350 g 1 More than a month ago   potassium chloride (KLOR-CON 10) 10 MEQ tablet Take 2 tablets (20 mEq total) by mouth 2 (two) times daily. (Patient taking differently: Take 10 mEq by mouth daily.) 180 tablet 1 Past Week   Sodium Hyaluronate, oral, (HYALURONIC ACID PO) Take 1 tablet by mouth daily.   Past Week   Vitamin D, Ergocalciferol, (DRISDOL) 1.25 MG (50000 UNIT) CAPS capsule TAKE 1 CAPSULE BY MOUTH EVERY THURSDAY (Patient taking differently: Take 50,000 Units by mouth once a week.) 12 capsule 1 10/23/2021   warfarin (COUMADIN) 6 MG tablet Take 1 tablet (6 mg total) by mouth daily. Take 2-1 tablets daily. (Patient taking differently: Take 3-6 mg by mouth See admin instructions. Take 339mon Tuesdays and Saturdays, then  58m on other days.) 90 tablet 0 10/28/2021 at 1100   blood glucose meter kit and supplies Dispense based on patient and insurance preference. Use up to four times daily as directed. (FOR ICD-10 E10.9, E11.9). 1 each 0    sertraline (ZOLOFT) 25 MG tablet Take 1 tablet (25 mg total) by mouth daily. (Patient not taking: Reported on 10/29/2021) 90 tablet 0 Not Taking   Scheduled:   docusate sodium  100 mg Oral BID   furosemide  40 mg Intravenous BID   insulin aspart  0-15 Units Subcutaneous TID WC   insulin aspart  0-5 Units Subcutaneous QHS   insulin glargine-yfgn  8 Units Subcutaneous Daily   metoprolol succinate  100 mg Oral BID   sodium  chloride flush  3 mL Intravenous Q12H   triamcinolone 0.1 % cream : eucerin   Topical BID   warfarin  4 mg Oral ONCE-1600   Warfarin - Pharmacist Dosing Inpatient   Does not apply q1600    Assessment: 70y.o male with h/o Afib, on Couamdin prior to admission.  Pharmacy consulted to resume Coumadin.   INR  = 1.9 on admit 12/20 @ 21:12,  no INR lab done this AM 12/21.   H/H 11.6/39.3, pltc wnl 166k.  No bleeding reported Goal INR = 2-3.   PTA coumadin dose is 365mqTues/Sat, 45m59mll other days , LD taken 12/20 @ 11AM pta.   Most recent outpatient Anti-coag visit note on 12/14:  INR 4.7 , dose plan was held dose x2 days then continued same dose of 3mg50mues/Sat, 45mg 75m other days  (has 45mg t3m at home)  Goal of Therapy:  INR 2-3 Monitor platelets by anticoagulation protocol: Yes   Plan:  Give Coumadin 4mg x173migher than home dose)  Daily INR Monitor for s/x of bleeding   Thank you for allowing pharmacy to be part of this patients care team.  Adilynne Fitzwater ClNicole CellaliLuvernecist [phone]/2022,12:31 PM

## 2021-10-29 NOTE — Evaluation (Signed)
Occupational Therapy Evaluation Patient Details Name: [name] MRN: [id number] DOB: 01/09/1951 Today's Date: 10/29/2021   History of Present Illness Pt is a 70 y.o. male admitted 10/28/21 with edema, weight gain, SOB. Workup for CHF exacerbation, AKI. PMH includes CHF, HTN, CKD, DM; admission 05/2021 for LLE vascular intervention.   Clinical Impression   Pt admitted with concerns listed above. PTA Pt reported that he was mainly independent with all ADL's and short distances for functional mobility. At this time, pt continues to demonstrate independence with most basic ADL's and is able to transfer and ambulate in room with no difficulties. Pt reports he has no further need for OT services, Acute OT will sign off.       Recommendations for follow up therapy are one component of a multi-disciplinary discharge planning process, led by the attending physician.  Recommendations may be updated based on patient status, additional functional criteria and insurance authorization.   Follow Up Recommendations  No OT follow up    Assistance Recommended at Discharge Set up Supervision/Assistance  Functional Status Assessment  Patient has had a recent decline in their functional status and demonstrates the ability to make significant improvements in function in a reasonable and predictable amount of time.  Equipment Recommendations  None recommended by OT    Recommendations for Other Services       Precautions / Restrictions Precautions Precautions: Fall Restrictions Weight Bearing Restrictions: No      Mobility Bed Mobility Overal bed mobility: Needs Assistance Bed Mobility: Supine to Sit;Sit to Supine     Supine to sit: Min assist     General bed mobility comments: MinA for HHA to elevate trunk    Transfers Overall transfer level: Needs assistance Equipment used: None Transfers: Sit to/from Stand Sit to Stand: Min guard           General transfer comment:  preference to keep R knee extended, use of bed rail for LUE and swinging hips up to R; min guard for balance      Balance Overall balance assessment: Needs assistance   Sitting balance-Leahy Scale: Fair Sitting balance - Comments: requires assist to don socks   Standing balance support: No upper extremity supported;Single extremity supported;During functional activity Standing balance-Leahy Scale: Fair Standing balance comment: can static stand and take steps without UE support; preference for [facility]                           ADL either performed or assessed with clinical judgement   ADL Overall ADL's : At baseline                                       General ADL Comments: Pt requires some assist with ADL's at baseline, reports that his daughter is able to assist most of the time.     Vision Baseline Vision/History: 4 Cataracts Ability to See in Adequate Light: 1 Impaired Patient Visual Report: No change from baseline Vision Assessment?: No apparent visual deficits     Perception     Praxis      Pertinent Vitals/Pain Pain Assessment: No/denies pain     Hand Dominance Right   Extremity/Trunk Assessment Upper Extremity Assessment Upper Extremity Assessment: Overall WFL for tasks assessed   Lower Extremity Assessment Lower Extremity Assessment: Defer to PT evaluation RLE Deficits / Details: H/o R groin (vascular?) injury,  reports "I can't lift that leg"; h/o R knee arthritic pain, painful ROM   Cervical / Trunk Assessment Cervical / Trunk Assessment: Normal   Communication Communication Communication: No difficulties   Cognition Arousal/Alertness: Awake/alert Behavior During Therapy: WFL for tasks assessed/performed Overall Cognitive Status: No family/caregiver present to determine baseline cognitive functioning                                 General Comments: Tangential and verbose with speech, unrelated topics, requires  frequent redirection to conversation     General Comments  Pt having multiple bouts of coughing, requiring liquids to assist with relieving throat irritation.    Exercises     Shoulder Instructions      Home Living Family/patient expects to be discharged to:: Private residence Living Arrangements: Children Available Help at Discharge: Family;Available PRN/intermittently Type of Home: House Home Access: Stairs to enter [employer] of Steps: 3 Entrance Stairs-Rails: Right Home Layout: One level     Bathroom Shower/Tub: Chief Strategy Officer: Standard     Home Equipment: [employer] - single point   Additional Comments: Lives with daughter and granddaughter      Prior Functioning/Environment Prior Level of Function : Independent/Modified Independent             Mobility Comments: Limited household ambulator with SPC; requires rest breaks and increased time; denies falls. Does not drive ADLs Comments: Daughter assists with LB dressing due to prior R groin injury. Reports baseline decreased vision; does not wear glasses        OT Problem List: Decreased strength;Decreased activity tolerance;Impaired balance (sitting and/or standing);Decreased safety awareness;Decreased knowledge of use of DME or AE      OT Treatment/Interventions:      OT Goals(Current goals can be found in the care plan section) Acute Rehab OT Goals Patient Stated Goal: To go home OT Goal Formulation: All assessment and education complete, DC therapy Time For Goal Achievement: 10/29/21 Potential to Achieve Goals: Good  OT Frequency:     Barriers to D/C:            Co-evaluation              AM-PAC OT "6 Clicks" Daily Activity     Outcome Measure Help from another person eating meals?: None Help from another person taking care of personal grooming?: None Help from another person toileting, which includes using toliet, bedpan, or urinal?: A Little Help from another  person bathing (including washing, rinsing, drying)?: A Little Help from another person to put on and taking off regular upper body clothing?: None Help from another person to put on and taking off regular lower body clothing?: A Little 6 Click Score: 21   End of Session Equipment Utilized During Treatment: Other (comment) [name]) Nurse Communication: Mobility status  Activity Tolerance: Patient tolerated treatment well Patient left: in bed;with call bell/phone within reach  OT Visit Diagnosis: Unsteadiness on feet (R26.81);Other abnormalities of gait and mobility (R26.89);Muscle weakness (generalized) (M62.81)                Time: 5929-2446 OT Time Calculation (min): 38 min Charges:  OT General Charges $OT Visit: 1 Visit OT Evaluation $OT Eval Moderate Complexity: 1 Mod OT Treatments $Therapeutic Activity: 8-22 mins  Darren Cuff H., OTR/L Acute Rehabilitation  [name] [name] 10/29/2021, 4:34 PM

## 2021-10-29 NOTE — H&P (Signed)
History and Physical    ED MANDICH BEM:[id number] DOB: [birth year] DOA: 10/28/2021  PCP: Sandrea Hughs, NP Consultants:  Carlis Abbott - vascular surgery; Camnitz/Bensimhon - cardiology Patient coming from:  Home - lives with daughter and granddaughter; NOK: Daughter, Boyd [place], [phone]   Chief Complaint: edema, weight gain, SOB  HPI: [name] is a 70 y.o. male with medical history significant of ADHD; afib on Coumadin; chronic systolic CHF; stage 3a CKD; DM; PVD s/p L 5th toe amputation; visual impairment; HTN; and OSA presenting with edema, weight gain, and SOB.  08/2020 echo showed normalization of the systolic function with grade 2 diastolic dysfunction.  He reports that he transitioned where his daughter was distributing the meds.  The 90-day supplies he gets from [facility] and he went 1-3 months with 100 mg heart pill.  He went back to BID pills when he realized it with the metoprolol.  Everything else is the same.  He has been accumulating fluid despite being back on the right meds. SOB when his knees are up.  No orthopnea.  He has gained 39 lbs in maybe 30 days.  He called 2 weeks ago to the heart failure clinic - he went from 20 mg BID to 60 mg TID without improvement.  He has not had chest pain.    ED Course: CHF, failed outpatient therapy.  Worsening edema, SOB, orthopnea.  Thinks he is 40 pounds above dry weight.  Mild AKI.  Has been on 20 mg Lasix -> BID.  Given 40 mg IV.  Not too SOB at rest.  Review of Systems: As per HPI; otherwise review of systems reviewed and negative.   Ambulatory Status:  Ambulates with a cane for stability  COVID Vaccine Status:  None  Past Medical History:  Diagnosis Date   A-fib ([place])    a. (06/04/14) TEE-DC-CV; succesful; large LA 6.2 cm   ADHD    Ascites    Athlete's foot    Chronic systolic heart failure ([place])    a. ECHO (05/2014): EF 25-30%, diff HK, RV midly dilated and sys fx mildly/mod reduced   CKD (chronic kidney  disease) stage 3, GFR 30-59 ml/min (HCC)    Depression    Diabetes mellitus due to underlying condition with diabetic chronic kidney disease, unspecified CKD stage, unspecified whether long term insulin use (HCC)    Heart murmur    HTN (hypertension)    OSA (obstructive sleep apnea)    PVD (peripheral vascular disease) (HCC)    s/p great toe amputation    Past Surgical History:  Procedure Laterality Date   ABDOMINAL AORTOGRAM W/LOWER EXTREMITY N/A 05/19/2021   Procedure: ABDOMINAL AORTOGRAM W/LOWER EXTREMITY;  Surgeon: Marty Heck, MD;  Location: [facility];  Service: Cardiovascular;  Laterality: N/A;   AMPUTATION Left 05/22/2021   Procedure: LEFT FIFTH TOE AMPUTATION;  Surgeon: Marty Heck, MD;  Location: [facility];  Service: Vascular;  Laterality: Left;   CARDIOVERSION N/A 06/04/2014   Procedure: CARDIOVERSION;  Surgeon: Larey Dresser, MD;  Location: [facility];  Service: Cardiovascular;  Laterality: N/A;   NOSE SURGERY     Nasal septum surgery   PERIPHERAL VASCULAR INTERVENTION Left 05/19/2021   Procedure: PERIPHERAL VASCULAR INTERVENTION;  Surgeon: Marty Heck, MD;  Location: [facility];  Service: Cardiovascular;  Laterality: Left;   TEE WITHOUT CARDIOVERSION N/A 06/04/2014   Procedure: TRANSESOPHAGEAL ECHOCARDIOGRAM (TEE);  Surgeon: Larey Dresser, MD;  Location: [facility];  Service: Cardiovascular;  Laterality: N/A;   TEE WITHOUT CARDIOVERSION N/A 01/06/2016   Procedure: TRANSESOPHAGEAL ECHOCARDIOGRAM (TEE);  Surgeon: Fay Records, MD;  Location: [facility];  Service: Cardiovascular;  Laterality: N/A;   VASECTOMY      Social History   Socioeconomic History   Marital status: Single    Spouse name: Not on file   Number of children: Not on file   Years of education: Not on file   Highest education level: Not on file  Occupational History   Occupation: retired  Tobacco Use   Smoking status: Some Days    Years: 52.00    Types:  Cigarettes, Cigars    Last attempt to quit: 05/2021    Years since quitting: 0.4   Smokeless tobacco: Never  Substance and Sexual Activity   Alcohol use: Not Currently   Drug use: Never   Sexual activity: Not on file  Other Topics Concern   Not on file  Social History Narrative   ** Merged History Encounter ** Lives in [place] by himself. Retired from [employer] and [employer] for [employer].       Tobacco use, amount per day now:   Past tobacco use, amount per day:   How many years did you use tobacco:   Alcohol use (drinks per week): N/A   Diet:   Do you drink/eat things with caffeine:   Marital status:  Divorced                                What year were you married? 2003   Do you live in a house, apartment, assisted living, condo, trailer, etc.? House   Is it one or more stories? One   How many persons live in your home?   Do you have pets in your home?( please list) N/A   Highest Level of education completed? Bachelors Degree   Current or past profession: Teacher-Special Ed   Do you exercise?  Yes                                Type and how often? Daily squats, and push ups.   Do you have a living will? No   Do you have a DNR form?  No                                 If not, do you want to discuss one?   Do you have signed POA/HPOA forms?  No                      If so, please bring to you appointment      Do you have any difficulty bathing or dressing yourself? Bathing only if pants not shirts/not shorts.   Do you have any difficulty preparing food or eating? No   Do you have any difficulty managing your medications? No   Do you have any difficulty managing your finances? No   Do you have any difficulty affording your medications? No   Social Determinants of Radio broadcast assistant Strain: Not on file  Food Insecurity: Not on file  Transportation Needs: Not on file  Physical Activity: Not on file  Stress: Not on file  Social Connections: Not on file   Intimate Partner Violence: Not on  file    No Known Allergies  Family History  Problem Relation Age of Onset   Heart attack Mother        deceased   Diabetes Mother    Heart attack Sister     Prior to Admission medications   Medication Sig Start Date End Date Taking? Authorizing Provider  blood glucose meter kit and supplies Dispense based on patient and insurance preference. Use up to four times daily as directed. (FOR ICD-10 E10.9, E11.9). 10/01/21   Gerlene Fee, NP  Cyanocobalamin (VITAMIN B-12 PO) Take 1 capsule by mouth daily.    [provider]  diclofenac Sodium (VOLTAREN) 1 % GEL Apply 2 g topically 4 (four) times daily. 06/26/21   Ngetich, Dinah C, NP  Ergocalciferol (VITAMIN D2 PO) Take 1 capsule by mouth daily.    [provider]  furosemide (LASIX) 20 MG tablet Take 1 tablet (20 mg total) by mouth 2 (two) times daily. May take an additional 31m daily as needed. 10/21/21   [place]ford, JMaricela Bo FNP  insulin glargine (LANTUS) 100 UNIT/ML injection Inject 0.08 mLs (8 Units total) into the skin daily. 06/26/21 09/24/21  Ngetich, Dinah C, NP  metoprolol succinate (TOPROL-XL) 100 MG 24 hr tablet TAKE 1 TABLET TWICE DAILY 08/11/21   Bensimhon, DShaune Pascal MD  polyethylene glycol powder (GLYCOLAX/MIRALAX) 17 GM/SCOOP powder Take 17 g by mouth daily. Hold for loose stool 06/26/21   Ngetich, Dinah C, NP  potassium chloride (KLOR-CON 10) 10 MEQ tablet Take 2 tablets (20 mEq total) by mouth 2 (two) times daily. 06/26/21 09/24/21  Ngetich, Dinah C, NP  sertraline (ZOLOFT) 25 MG tablet Take 1 tablet (25 mg total) by mouth daily. 06/26/21 09/24/21  Ngetich, Dinah C, NP  Vitamin D, Ergocalciferol, (DRISDOL) 1.25 MG (50000 UNIT) CAPS capsule TAKE 1 CAPSULE BY MOUTH EVERY THURSDAY 09/09/21   Ngetich, Dinah C, NP  warfarin (COUMADIN) 6 MG tablet Take 1 tablet (6 mg total) by mouth daily. Take 2-1 tablets daily. 07/24/21   CConstance Haw MD    Physical Exam: Vitals:    10/29/21 0457 10/29/21 0840 10/29/21 0930 10/29/21 1015  BP: (!) 169/88 (!) 184/88 (!) 174/89 (!) 173/93  Pulse: (!) 56 68 60 63  Resp: '17 18 12 13  ' Temp: 98.8 F (37.1 C)     TempSrc: Oral     SpO2: 94% 92% 91% 90%     General:  Appears calm and comfortable and is in NAD Eyes:  L eye without red reflex, EOMI, normal lids, iris ENT:  grossly normal hearing, lips & tongue, mmm; appropriate dentition Neck:  no LAD, masses or thyromegaly Cardiovascular:  RRR, no m/r/g. 3+ extending up to posterior thighs LE edema.  Respiratory:   CTA bilaterally with no wheezes/rales/rhonchi.  Normal respiratory effort. Abdomen:  soft, NT, ND Skin:  stasis dermatitis without apparent cellulitis Musculoskeletal:  grossly normal tone BUE/BLE, good ROM, no bony abnormality; s/p L 5th toe amputation and L thumb amputation Psychiatric:  eccentric normal mood and affect, speech fluent and appropriate, AOx3 Neurologic:  CN 2-12 grossly intact, moves all extremities in coordinated fashion    Radiological Exams on Admission: Independently reviewed - see discussion in A/P where applicable  DG Chest 2 View  Result Date: 10/28/2021 CLINICAL DATA:  Shortness of breath and leg swelling EXAM: CHEST - 2 VIEW COMPARISON:  05/01/2016 FINDINGS: Cardiac shadow is enlarged but stable. Lungs are well aerated bilaterally. Minimal atelectatic changes are seen in the bases. No bony  abnormality is noted. IMPRESSION: Mild bibasilar atelectasis. Electronically Signed   By: Inez Catalina M.D.   On: 10/28/2021 22:18    EKG: Independently reviewed.  NSR with rate 64; incomplete RBBB; nonspecific ST changes with no evidence of acute ischemia   Labs on Admission: I have personally reviewed the available labs and imaging studies at the time of the admission.  Pertinent labs:   Glucose 206 BUN 28/Creatinine 1.50/GFR 49; 19/1.18/66 on 8/5 BNP 1165.7; 184.8 on 07/16/20 Unremarkable CBC INR 1.9   Assessment/Plan Principal  Problem:   Acute on chronic combined systolic and diastolic CHF (congestive heart failure) (HCC) Active Problems:   Type 2 diabetes mellitus with stage 3 chronic kidney disease, with long-term current use of insulin (HCC)   HTN (hypertension)   Acute-on-chronic kidney injury ([place])   Atrial fibrillation (HCC)    Acute on chronic combined CHF -Patient with known h/o chronic combined CHF presenting with worsening edema and weight loss -He does not have overt respiratory failure -CXR negative for pulmonary edema -Elevated BNP compared to prior -Acute decompensated CHF seems probable as diagnosis -Will place in observation status with telemetry, as there are no current findings necessitating admission (hemodynamic instability, severe electrolyte abnormalities, cardiac arrhythmias, ACS, severe pulmonary edema requiring new O2 therapy, AMS) -Will request echocardiogram -CHF order set utilized; may need CHF team consult but will hold until Echo results are available -Was given Lasix 40 mg x 1 in ER and will repeat with 40 mg IV BID  AKI -Likely associated with decreased renal perfusion in the setting of volume overload -Will follow with diuresis -It is not clear why he is not on ACE/ARB; consider adding once AKI has resolved  HTN -Continue home dose of metoprolol -Will also add prn hydralazine  DM -Last A1c was 8.2, indicating suboptimal control -Continue glargine -Will cover with moderate-scale SSI for now  Afib -Rate control with metoprolol -Coumadin, dosing per pharmacy    Note: This patient has been tested and is negative for the novel coronavirus COVID-19. He has NOT been vaccinated against COVID-19.   Level of care:   DVT prophylaxis: Lovenox  Code Status:  Full - confirmed with patient Family Communication: None present Disposition Plan:  The patient is from: home  Anticipated d/c is to: home, possibly with [facility] services  Anticipated d/c date will depend on clinical  response to treatment, possibly as early as tomorrow if he responds well to treatment  Patient is currently: acutely ill Consults called: TOC team; PT/OT; Nutrition Admission status: It is my clinical opinion that referral for OBSERVATION is reasonable and necessary in this patient based on the above information provided. The aforementioned taken together are felt to place the patient at high risk for further clinical deterioration. However it is anticipated that the patient may be medically stable for discharge from the hospital within 24 to 48 hours.      Karmen Bongo MD [facility]   How to contact the [facility] Attending or Consulting provider [place] or covering provider during after hours [place], for this patient?  Check the care team in [facility] and look for a) attending/consulting [facility] provider listed and b) the [facility] team listed Log into [email or web address] and use [place]'s universal password to access. If you do not have the password, please contact the hospital operator. Locate the [facility] provider you are looking for under [facility] and page to a number that you can be directly reached. If you still have difficulty reaching  the provider, please page the [facility] (Director on Call) for the Hospitalists listed on amion for assistance.   10/29/2021, 11:12 AM

## 2021-10-30 DIAGNOSIS — L8990 Pressure ulcer of unspecified site, unspecified stage: Secondary | ICD-10-CM | POA: Insufficient documentation

## 2021-10-30 LAB — CBC
HCT: 34.2 % — ABNORMAL LOW (ref 39.0–52.0)
Hemoglobin: 10.6 g/dL — ABNORMAL LOW (ref 13.0–17.0)
MCH: 28.3 pg (ref 26.0–34.0)
MCHC: 31 g/dL (ref 30.0–36.0)
MCV: 91.4 fL (ref 80.0–100.0)
Platelets: 135 10*3/uL — ABNORMAL LOW (ref 150–400)
RBC: 3.74 MIL/uL — ABNORMAL LOW (ref 4.22–5.81)
RDW: 16.4 % — ABNORMAL HIGH (ref 11.5–15.5)
WBC: 6.1 10*3/uL (ref 4.0–10.5)
nRBC: 0 % (ref 0.0–0.2)

## 2021-10-30 LAB — BASIC METABOLIC PANEL
Anion gap: 8 (ref 5–15)
BUN: 22 mg/dL (ref 8–23)
CO2: 25 mmol/L (ref 22–32)
Calcium: 8.4 mg/dL — ABNORMAL LOW (ref 8.9–10.3)
Chloride: 104 mmol/L (ref 98–111)
Creatinine, Ser: 1.45 mg/dL — ABNORMAL HIGH (ref 0.61–1.24)
GFR, Estimated: 52 mL/min — ABNORMAL LOW (ref >60)
Glucose, Bld: 124 mg/dL — ABNORMAL HIGH (ref 70–99)
Potassium: 3.6 mmol/L (ref 3.5–5.1)
Sodium: 137 mmol/L (ref 135–145)

## 2021-10-30 LAB — GLUCOSE, CAPILLARY
Glucose-Capillary: 153 mg/dL — ABNORMAL HIGH (ref 70–99)
Glucose-Capillary: 128 mg/dL — ABNORMAL HIGH (ref 70–99)
Glucose-Capillary: 119 mg/dL — ABNORMAL HIGH (ref 70–99)
Glucose-Capillary: 179 mg/dL — ABNORMAL HIGH (ref 70–99)

## 2021-10-30 LAB — PROTIME-INR
INR: 2 — ABNORMAL HIGH (ref 0.8–1.2)
Prothrombin Time: 22.8 seconds — ABNORMAL HIGH (ref 11.4–15.2)

## 2021-10-30 MED ORDER — WARFARIN SODIUM 5 MG PO TABS
5.0000 mg | ORAL_TABLET | Freq: Once | ORAL | Status: AC
  Administered 2021-10-30: 16:00:00 5 mg via ORAL
  Filled 2021-10-30: qty 1

## 2021-10-30 MED ORDER — FUROSEMIDE 10 MG/ML IJ SOLN
80.0000 mg | Freq: Two times a day (BID) | INTRAMUSCULAR | Status: DC
  Administered 2021-10-30 – 2021-11-03 (×9): 80 mg via INTRAVENOUS
  Filled 2021-10-30 (×10): qty 8

## 2021-10-30 MED ORDER — HYDRALAZINE HCL 20 MG/ML IJ SOLN: 10.0000 mg | INTRAMUSCULAR | Status: DC | PRN

## 2021-10-30 MED ORDER — ENSURE MAX PROTEIN PO LIQD
11.0000 [oz_av] | Freq: Two times a day (BID) | ORAL | Status: DC
  Administered 2021-10-30 – 2021-11-10 (×19): 11 [oz_av] via ORAL
  Filled 2021-10-30 (×26): qty 330

## 2021-10-30 MED ORDER — ADULT MULTIVITAMIN W/MINERALS CH
1.0000 | ORAL_TABLET | Freq: Every day | ORAL | Status: DC
  Administered 2021-10-30 – 2021-11-10 (×12): 1 via ORAL
  Filled 2021-10-30 (×12): qty 1

## 2021-10-30 NOTE — Progress Notes (Signed)
Orthopedic Tech Progress Note Patient Details:  SHADY BRADISH 14-Aug-1951 [id number]  Ortho Devices Type of Ortho Device: Radio broadcast assistant Ortho Device/Splint Location: BIl Ortho Device/Splint Interventions: Ordered, Application, Adjustment   Post Interventions Patient Tolerated: Well  Darleen Crocker 10/30/2021, 3:29 PM

## 2021-10-30 NOTE — Progress Notes (Signed)
PROGRESS NOTE    [name]  NWG:[id number] DOB: December 16, 1950 DOA: 10/28/2021 PCP: Caesar Bookman, NP   Brief Narrative:  [name] is a 70 y.o. male with medical history significant of ADHD; afib on Coumadin; chronic systolic CHF; stage 3a CKD; DM; PVD s/p L 5th toe amputation; visual impairment; HTN; and OSA presented with edema, weight gain, and SOB and admitted under hospitalist service with acute on chronic combined CHF without hypoxia as well as had AKI.  He has gained 33 pounds in 1 month  Assessment & Plan:   Principal Problem:   Acute on chronic combined systolic and diastolic CHF (congestive heart failure) (HCC) Active Problems:   Type 2 diabetes mellitus with stage 3 chronic kidney disease, with long-term current use of insulin (HCC)   HTN (hypertension)   Acute-on-chronic kidney injury (HCC)   Atrial fibrillation (HCC)   Pressure injury of skin  Acute on chronic combined CHF: Patient with known h/o chronic combined CHF presenting with worsening edema and weight gain of about 33 pounds in 1 month.  No hypoxia.  Chest x-ray negative for pulmonary edema.  Elevated BNP compared to previous.  Has significant bilateral lower extremity edema.  Repeat echo pending.  Cardiology/heart failure team on board.  We will continue Lasix 40 mg IV twice daily in the meantime.  AKI: Likely cardiorenal.  Slight improvement in creatinine.  Hopefully this will improve with further diuresis.  Avoid nephrotoxic agents.  Essential hypertension: Controlled, continue home dose of Toprol-XL and as needed hydralazine.   Type 2 diabetes mellitus: Recent hemoglobin A1c 8.2.  Continue home dose of glargine and SSI.  Paroxysmal A. fib: Rate controlled, continue Toprol-XL and Coumadin dosed by pharmacy.  INR therapeutic.  Note: This patient has been tested and is negative for the novel coronavirus COVID-19. He has NOT been vaccinated against COVID-19.  DVT prophylaxis:    Code Status:  Full Code  Family Communication:  None present at bedside.  Plan of care discussed with patient in length and he verbalized understanding and agreed with it.  Status is: Observation  The patient will require care spanning > 2 midnights and should be moved to inpatient because: Will need further IV diuresis.  Estimated body mass index is 35.32 kg/m as calculated from the following:   Height as of 07/28/21: 5\' 8"  (1.727 m).   Weight as of this encounter: 105.4 kg.  Pressure Injury 10/29/21 Buttocks Right;Left Stage 1 -  Intact skin with non-blanchable redness of a localized area usually over a bony prominence. Buttock pink and non blanchable (Active)  10/29/21 1210  Location: Buttocks  Location Orientation: Right;Left  Staging: Stage 1 -  Intact skin with non-blanchable redness of a localized area usually over a bony prominence.  Wound Description (Comments): Buttock pink and non blanchable  Present on Admission: Yes    Nutritional Assessment: Body mass index is 35.32 kg/m.10/31/21 Seen by dietician.  I agree with the assessment and plan as outlined below: Nutrition Status:   Skin Assessment: I have examined the patient's skin and I agree with the wound assessment as performed by the wound care RN as outlined below: Pressure Injury 10/29/21 Buttocks Right;Left Stage 1 -  Intact skin with non-blanchable redness of a localized area usually over a bony prominence. Buttock pink and non blanchable (Active)  10/29/21 1210  Location: Buttocks  Location Orientation: Right;Left  Staging: Stage 1 -  Intact skin with non-blanchable redness of a localized area usually over a bony  prominence.  Wound Description (Comments): Buttock pink and non blanchable  Present on Admission: Yes    Consultants:  Cardiology/heart failure  Procedures:  None  Antimicrobials:  Anti-infectives (From admission, onward)    None          Subjective: Seen and examined.  No shortness of breath.  He is only  concerned about his weight gain and bilateral lower extremity edema.  Objective: Vitals:   10/29/21 2044 10/30/21 0147 10/30/21 0512 10/30/21 0839  BP: (!) 152/74 (!) 142/67 (!) 151/76 (!) 142/88  Pulse: 72 (!) 59 71 61  Resp: 16 16 16 17   Temp: 98.1 F (36.7 C) 97.7 F (36.5 C) 99 F (37.2 C) 98 F (36.7 C)  TempSrc: Oral Oral Oral Oral  SpO2: (!) 89% (!) 88% 90% 90%  Weight:   105.4 kg     Intake/Output Summary (Last 24 hours) at 10/30/2021 1025 Last data filed at 10/30/2021 0934 Gross per 24 hour  Intake 440 ml  Output 3333 ml  Net -2893 ml   Filed Weights   10/30/21 0512  Weight: 105.4 kg    Examination:  General exam: Appears calm and comfortable, obese Respiratory system: Clear to auscultation. Respiratory effort normal. Cardiovascular system: S1 & S2 heard, RRR. No JVD, murmurs, rubs, gallops or clicks.  +2 pitting edema bilateral lower extremity. Gastrointestinal system: Abdomen is nondistended, soft and nontender. No organomegaly or masses felt. Normal bowel sounds heard. Central nervous system: Alert and oriented. No focal neurological deficits. Extremities: Symmetric 5 x 5 power. Skin: No rashes, lesions or ulcers Psychiatry: Judgement and insight appear normal. Mood & affect appropriate.    Data Reviewed: I have personally reviewed following labs and imaging studies  CBC: Recent Labs  Lab 10/28/21 2112 10/30/21 0248  WBC 6.2 6.1  NEUTROABS 4.4  --   HGB 11.6* 10.6*  HCT 39.3 34.2*  MCV 94.9 91.4  PLT 166 [id number]*   Basic Metabolic Panel: Recent Labs  Lab 10/28/21 2112 10/30/21 0248  NA 140 137  K 4.3 3.6  CL 106 104  CO2 28 25  GLUCOSE 206* 124*  BUN 28* 22  CREATININE 1.50* 1.45*  CALCIUM 9.0 8.4*  MG 2.4  --    GFR: Estimated Creatinine Clearance: 55 mL/min (A) (by C-G formula based on SCr of 1.45 mg/dL (H)). Liver Function Tests: Recent Labs  Lab 10/28/21 2112  AST 23  ALT 35  ALKPHOS 41  BILITOT 1.1  PROT 7.3  ALBUMIN 3.6    No results for input(s): LIPASE, AMYLASE in the last 168 hours. No results for input(s): AMMONIA in the last 168 hours. Coagulation Profile: Recent Labs  Lab 10/28/21 2112 10/30/21 0248  INR 1.9* 2.0*   Cardiac Enzymes: No results for input(s): CKTOTAL, CKMB, CKMBINDEX, TROPONINI in the last 168 hours. BNP (last 3 results) No results for input(s): PROBNP in the last 8760 hours. HbA1C: No results for input(s): HGBA1C in the last 72 hours. CBG: Recent Labs  Lab 10/29/21 1210 10/29/21 1612 10/29/21 2107 10/30/21 0607  GLUCAP 133* 194* 161* 153*   Lipid Profile: No results for input(s): CHOL, HDL, LDLCALC, TRIG, CHOLHDL, LDLDIRECT in the last 72 hours. Thyroid Function Tests: No results for input(s): TSH, T4TOTAL, FREET4, T3FREE, THYROIDAB in the last 72 hours. Anemia Panel: No results for input(s): VITAMINB12, FOLATE, FERRITIN, TIBC, IRON, RETICCTPCT in the last 72 hours. Sepsis Labs: No results for input(s): PROCALCITON, LATICACIDVEN in the last 168 hours.  Recent Results (from the past 240 hour(s))  Resp Panel by RT-PCR (Flu A&B, Covid) Nasopharyngeal Swab     Status: None   Collection Time: 10/29/21  8:26 AM   Specimen: Nasopharyngeal Swab; Nasopharyngeal(NP) swabs in vial transport medium  Result Value Ref Range Status   SARS Coronavirus 2 by RT PCR NEGATIVE NEGATIVE Final    Comment: (NOTE) SARS-CoV-2 target nucleic acids are NOT DETECTED.  The SARS-CoV-2 RNA is generally detectable in upper respiratory specimens during the acute phase of infection. The lowest concentration of SARS-CoV-2 viral copies this assay can detect is 138 copies/mL. A negative result does not preclude SARS-Cov-2 infection and should not be used as the sole basis for treatment or other patient management decisions. A negative result may occur with  improper specimen collection/handling, submission of specimen other than nasopharyngeal swab, presence of viral mutation(s) within the areas  targeted by this assay, and inadequate number of viral copies(<138 copies/mL). A negative result must be combined with clinical observations, patient history, and epidemiological information. The expected result is Negative.  Fact Sheet for Patients:  EntrepreneurPulse.com.au  Fact Sheet for Healthcare Providers:  IncredibleEmployment.be  This test is no t yet approved or cleared by the [place] FDA and  has been authorized for detection and/or diagnosis of SARS-CoV-2 by FDA under an Emergency Use Authorization (EUA). This EUA will remain  in effect (meaning this test can be used) for the duration of the COVID-19 declaration under Section 564(b)(1) of the Act, 21 U.S.C.section 360bbb-3(b)(1), unless the authorization is terminated  or revoked sooner.       Influenza A by PCR NEGATIVE NEGATIVE Final   Influenza B by PCR NEGATIVE NEGATIVE Final    Comment: (NOTE) The Xpert Xpress SARS-CoV-2/FLU/RSV plus assay is intended as an aid in the diagnosis of influenza from Nasopharyngeal swab specimens and should not be used as a sole basis for treatment. Nasal washings and aspirates are unacceptable for Xpert Xpress SARS-CoV-2/FLU/RSV testing.  Fact Sheet for Patients: EntrepreneurPulse.com.au  Fact Sheet for Healthcare Providers: IncredibleEmployment.be  This test is not yet approved or cleared by the [place] FDA and has been authorized for detection and/or diagnosis of SARS-CoV-2 by FDA under an Emergency Use Authorization (EUA). This EUA will remain in effect (meaning this test can be used) for the duration of the COVID-19 declaration under Section 564(b)(1) of the Act, 21 U.S.C. section 360bbb-3(b)(1), unless the authorization is terminated or revoked.  Performed at [facility], [place] [address] [place], [place] [address]       Radiology Studies: DG Chest 2 View  Result Date:  10/28/2021 CLINICAL DATA:  Shortness of breath and leg swelling EXAM: CHEST - 2 VIEW COMPARISON:  05/01/2016 FINDINGS: Cardiac shadow is enlarged but stable. Lungs are well aerated bilaterally. Minimal atelectatic changes are seen in the bases. No bony abnormality is noted. IMPRESSION: Mild bibasilar atelectasis. Electronically Signed   By: Inez Catalina M.D.   On: 10/28/2021 22:18   ECHOCARDIOGRAM COMPLETE  Result Date: 10/29/2021    ECHOCARDIOGRAM REPORT   Patient Name:   KEESHAUN WIGGINGTON Date of Exam: 10/29/2021 Medical Rec #:  [id number]         Height:       68.0 in Accession #:    [id number]        Weight:       202.2 lb Date of Birth:  03/23/1951        BSA:          2.053 m Patient Age:  70 years          BP:           178/87 mmHg Patient Gender: M                 HR:           61 bpm. Exam Location:  Inpatient Procedure: 2D Echo Indications:    acute systolic chf  History:        Patient has prior history of Echocardiogram examinations, most                 recent 08/13/2020. Chronic kidney disease, Arrythmias:Atrial                 Fibrillation; Risk Factors:Diabetes and Hypertension.  Sonographer:    Johny Chess RDCS Referring Phys: [place]  1. Left ventricular ejection fraction, by estimation, is 45 to 50%. The left ventricle has mildly decreased function. The left ventricle demonstrates regional wall motion abnormalities (see scoring diagram/findings for description). Left ventricular diastolic parameters are consistent with Grade II diastolic dysfunction (pseudonormalization). Elevated left atrial pressure. There is mild global left ventricular hypokinesis, slightly worse in the basal and mid segments of the inferior and inferolateral walls.  2. Right ventricular systolic function is moderately reduced. The right ventricular size is moderately enlarged. There is severely elevated pulmonary artery systolic pressure. The estimated right ventricular systolic pressure  is 99991111 mmHg.  3. Left atrial size was moderately dilated.  4. Right atrial size was moderately dilated.  5. The mitral valve is normal in structure. No evidence of mitral valve regurgitation.  6. Tricuspid valve regurgitation is mild to moderate.  7. The aortic valve is tricuspid. Aortic valve regurgitation is not visualized. No aortic stenosis is present.  8. The inferior vena cava is dilated in size with <50% respiratory variability, suggesting right atrial pressure of 15 mmHg. Comparison(s): Prior images reviewed side by side. The overall left ventricular function is worsened. The left ventricular diastolic function is unchanged. The left ventricular wall motion abnormalities are unchanged. The striking changes involve marked worsening of the pulmonary hypertension, tricuspid insufficiency and right ventricular dysfunction. FINDINGS  Left Ventricle: Left ventricular ejection fraction, by estimation, is 45 to 50%. The left ventricle has mildly decreased function. The left ventricle demonstrates regional wall motion abnormalities. The left ventricular internal cavity size was normal in size. There is no left ventricular hypertrophy. Left ventricular diastolic parameters are consistent with Grade II diastolic dysfunction (pseudonormalization). Elevated left atrial pressure.  LV Wall Scoring: The inferior wall and posterior wall are hypokinetic. There is mild global left ventricular hypokinesis, slightly worse in the basal and mid segments of the inferior and inferolateral walls. Right Ventricle: The right ventricular size is moderately enlarged. No increase in right ventricular wall thickness. Right ventricular systolic function is moderately reduced. There is severely elevated pulmonary artery systolic pressure. The tricuspid regurgitant velocity is 4.17 m/s, and with an assumed right atrial pressure of 15 mmHg, the estimated right ventricular systolic pressure is 99991111 mmHg. Left Atrium: Left atrial size was  moderately dilated. Right Atrium: Right atrial size was moderately dilated. Pericardium: There is no evidence of pericardial effusion. Mitral Valve: The mitral valve is normal in structure. No evidence of mitral valve regurgitation. Tricuspid Valve: The tricuspid valve is grossly normal. Tricuspid valve regurgitation is mild to moderate. Aortic Valve: The aortic valve is tricuspid. Aortic valve regurgitation is not visualized. No aortic stenosis is present. Pulmonic Valve: The pulmonic valve  was normal in structure. Pulmonic valve regurgitation is not visualized. Aorta: The aortic root and ascending aorta are structurally normal, with no evidence of dilitation. Venous: The inferior vena cava is dilated in size with less than 50% respiratory variability, suggesting right atrial pressure of 15 mmHg. IAS/Shunts: No atrial level shunt detected by color flow Doppler.  LEFT VENTRICLE PLAX 2D LVIDd:         5.40 cm      Diastology LVIDs:         3.40 cm      LV e' medial:    4.68 cm/s LV PW:         1.10 cm      LV E/e' medial:  18.8 LV IVS:        0.90 cm      LV e' lateral:   8.05 cm/s LVOT diam:     2.30 cm      LV E/e' lateral: 10.9 LV SV:         65 LV SV Index:   32 LVOT Area:     4.15 cm  LV Volumes (MOD) LV vol d, MOD A2C: 116.0 ml LV vol s, MOD A2C: 52.7 ml LV SV MOD A2C:     63.3 ml RIGHT VENTRICLE             IVC RV S prime:     12.00 cm/s  IVC diam: 2.50 cm TAPSE (M-mode): 1.7 cm LEFT ATRIUM             Index        RIGHT ATRIUM           Index LA diam:        4.80 cm 2.34 cm/m   RA Area:     21.50 cm LA Vol (A2C):   99.2 ml 48.31 ml/m  RA Volume:   57.30 ml  27.91 ml/m LA Vol (A4C):   81.8 ml 39.84 ml/m LA Biplane Vol: 90.6 ml 44.12 ml/m  AORTIC VALVE LVOT Vmax:   83.90 cm/s LVOT Vmean:  51.400 cm/s LVOT VTI:    0.156 m  AORTA Ao Root diam: 2.90 cm Ao Asc diam:  3.20 cm MITRAL VALVE               TRICUSPID VALVE MV Area (PHT): 3.60 cm    TR [name] grad:   69.6 mmHg MV Decel Time: 211 msec    TR Vmax:         417.00 cm/s MV E velocity: 87.90 cm/s MV A velocity: 28.40 cm/s  SHUNTS MV E/A ratio:  3.10        Systemic VTI:  0.16 m                            Systemic Diam: 2.30 cm Mihai Croitoru MD Electronically signed by Sanda Klein MD Signature Date/Time: 10/29/2021/3:59:18 PM    Final     Scheduled Meds:  docusate sodium  100 mg Oral BID   furosemide  40 mg Intravenous BID   insulin aspart  0-15 Units Subcutaneous TID WC   insulin aspart  0-5 Units Subcutaneous QHS   insulin glargine-yfgn  8 Units Subcutaneous Daily   metoprolol succinate  100 mg Oral BID   sodium chloride flush  3 mL Intravenous Q12H   triamcinolone 0.1 % cream : eucerin   Topical BID   warfarin  5 mg Oral ONCE-1600   Warfarin -  Pharmacist Dosing Inpatient   Does not apply q1600   Continuous Infusions:   LOS: 0 days   Time spent: 36 minutes   Darliss Cheney, MD [facility]  10/30/2021, 10:25 AM  Please page via [place] and do not message via secure chat for anything urgent. Secure chat can be used for anything non urgent.  How to contact the [facility] Attending or Consulting provider [place] or covering provider during after hours [place], for this patient?  Check the care team in [facility] and look for a) attending/consulting [facility] provider listed and b) the [facility] team listed. Page or secure chat [phone]. Log into [email or web address] and use [place]'s universal password to access. If you do not have the password, please contact the hospital operator. Locate the [facility] provider you are looking for under [facility] and page to a number that you can be directly reached. If you still have difficulty reaching the provider, please page the [facility] (Director on Call) for the Hospitalists listed on amion for assistance.

## 2021-10-30 NOTE — Discharge Instructions (Addendum)
Information on my medicine - Coumadin   (Warfarin)  Why was Coumadin prescribed for you? Coumadin was prescribed for you because you have a blood clot or a medical condition that can cause an increased risk of forming blood clots. Blood clots can cause serious health problems by blocking the flow of blood to the heart, lung, or brain. Coumadin can prevent harmful blood clots from forming. As a reminder your indication for Coumadin is:  Stroke Prevention because of Atrial Fibrillation  What test will check on my response to Coumadin? While on Coumadin (warfarin) you will need to have an INR test regularly to ensure that your dose is keeping you in the desired range. The INR (international normalized ratio) number is calculated from the result of the laboratory test called prothrombin time (PT).  If an INR APPOINTMENT HAS NOT ALREADY BEEN MADE FOR YOU please schedule an appointment to have this lab work done by your health care provider within 7 days. Your INR goal is usually a number between:  2 to 3 or your provider may give you a more narrow range like 2-2.5.  Ask your health care provider during an office visit what your goal INR is.  What  do you need to  know  About  COUMADIN? Take Coumadin (warfarin) exactly as prescribed by your healthcare provider about the same time each day.  DO NOT stop taking without talking to the doctor who prescribed the medication.  Stopping without other blood clot prevention medication to take the place of Coumadin may increase your risk of developing a new clot or stroke.  Get refills before you run out.  What do you do if you miss a dose? If you miss a dose, take it as soon as you remember on the same day then continue your regularly scheduled regimen the next day.  Do not take two doses of Coumadin at the same time.  Important Safety Information A possible side effect of Coumadin (Warfarin) is an increased risk of bleeding. You should call your healthcare  provider right away if you experience any of the following: Bleeding from an injury or your nose that does not stop. Unusual colored urine (red or dark brown) or unusual colored stools (red or black). Unusual bruising for unknown reasons. A serious fall or if you hit your head (even if there is no bleeding).  Some foods or medicines interact with Coumadin (warfarin) and might alter your response to warfarin. To help avoid this: Eat a balanced diet, maintaining a consistent amount of Vitamin K. Notify your provider about major diet changes you plan to make. Avoid alcohol or limit your intake to 1 drink for women and 2 drinks for men per day. (1 drink is 5 oz. wine, 12 oz. beer, or 1.5 oz. liquor.)  Make sure that ANY health care provider who prescribes medication for you knows that you are taking Coumadin (warfarin).  Also make sure the healthcare provider who is monitoring your Coumadin knows when you have started a new medication including herbals and non-prescription products.  Coumadin (Warfarin)  Major Drug Interactions  Increased Warfarin Effect Decreased Warfarin Effect  Alcohol (large quantities) Antibiotics (esp. Septra/Bactrim, Flagyl, Cipro) Amiodarone (Cordarone) Aspirin (ASA) Cimetidine (Tagamet) Megestrol (Megace) NSAIDs (ibuprofen, naproxen, etc.) Piroxicam (Feldene) Propafenone (Rythmol SR) Propranolol (Inderal) Isoniazid (INH) Posaconazole (Noxafil) Barbiturates (Phenobarbital) Carbamazepine (Tegretol) Chlordiazepoxide (Librium) Cholestyramine (Questran) Griseofulvin Oral Contraceptives Rifampin Sucralfate (Carafate) Vitamin K   Coumadin (Warfarin) Major Herbal Interactions  Increased Warfarin Effect Decreased Warfarin Effect  Garlic Ginseng Ginkgo biloba Coenzyme Q10 Green tea St. Johns wort    Coumadin (Warfarin) FOOD Interactions  Eat a consistent number of servings per week of foods HIGH in Vitamin K (1 serving =  cup)  Collards (cooked, or  boiled & drained) Kale (cooked, or boiled & drained) Mustard greens (cooked, or boiled & drained) Parsley *serving size only =  cup Spinach (cooked, or boiled & drained) Swiss chard (cooked, or boiled & drained) Turnip greens (cooked, or boiled & drained)  Eat a consistent number of servings per week of foods MEDIUM-HIGH in Vitamin K (1 serving = 1 cup)  Asparagus (cooked, or boiled & drained) Broccoli (cooked, boiled & drained, or raw & chopped) Brussel sprouts (cooked, or boiled & drained) *serving size only =  cup Lettuce, raw (green leaf, endive, romaine) Spinach, raw Turnip greens, raw & chopped   These websites have more information on Coumadin (warfarin):  FailFactory.se; VeganReport.com.au;  Low Sodium Nutrition Therapy  Eating less sodium can help you if you have high blood pressure, heart failure, or kidney or liver disease.   Your body needs a little sodium, but too much sodium can cause your body to hold onto extra water. This extra water will raise your blood pressure and can cause damage to your heart, kidneys, or liver as they are forced to work harder.   Sometimes you can see how the extra fluid affects you because your hands, legs, or belly swell. You may also hold water around your heart and lungs, which makes it hard to breathe.   Even if you take medication for blood pressure or a water pill (diuretic) to remove fluid, it is still important to have less salt in your diet.   Check with your primary care provider before drinking alcohol since it may affect the amount of fluid in your body and how your heart, kidneys, or liver work. Sodium in Food A low-sodium meal plan limits the sodium that you get from food and beverages to 1,500-2,000 milligrams (mg) per day. Salt is the main source of sodium. Read the nutrition label on the package to find out how much sodium is in one serving of a food.  Select foods with 140 milligrams (mg) of sodium or less  per serving.  You may be able to eat one or two servings of foods with a little more than 140 milligrams (mg) of sodium if you are closely watching how much sodium you eat in a day.  Check the serving size on the label. The amount of sodium listed on the label shows the amount in one serving of the food. So, if you eat more than one serving, you will get more sodium than the amount listed.  Tips Cutting Back on Sodium Eat more fresh foods.  Fresh fruits and vegetables are low in sodium, as well as frozen vegetables and fruits that have no added juices or sauces.  Fresh meats are lower in sodium than processed meats, such as bacon, sausage, and hotdogs.  Not all processed foods are unhealthy, but some processed foods may have too much sodium.  Eat less salt at the table and when cooking. One of the ingredients in salt is sodium.  One teaspoon of table salt has 2,300 milligrams of sodium.  Leave the salt out of recipes for pasta, casseroles, and soups. Be a Paramedic.  Food packages that say Salt-free, sodium-free, very low sodium, and low sodium have less than 140 milligrams of sodium per serving.  Beware of products identified as Unsalted, No Salt Added, Reduced Sodium, or Lower Sodium. These items may still be high in sodium. You should always check the nutrition label. Add flavors to your food without adding sodium.  Try lemon juice, lime juice, or vinegar.  Dry or fresh herbs add flavor.  Buy a sodium-free seasoning blend or make your own at home. You can purchase salt-free or sodium-free condiments like barbeque sauce in stores and online. Ask your registered dietitian nutritionist for recommendations and where to find them.   Eating in Restaurants Choose foods carefully when you eat outside your home. Restaurant foods can be very high in sodium. Many restaurants provide nutrition facts on their menus or their websites. If you cannot find that information, ask your  server. Let your server know that you want your food to be cooked without salt and that you would like your salad dressing and sauces to be served on the side.    Foods Recommended Food Group Foods Recommended  Grains Bread, bagels, rolls without salted tops Homemade bread made with reduced-sodium baking powder Cold cereals, especially shredded wheat and puffed rice Oats, grits, or cream of wheat Pastas, quinoa, and rice Popcorn, pretzels or crackers without salt Corn tortillas  Protein Foods Fresh meats and fish; [place] bacon (check the nutrition labels - make sure they are not packaged in a sodium solution) Canned or packed tuna (no more than 4 ounces at 1 serving) Beans and peas Soybeans) and tofu Eggs Nuts or nut butters without salt  Dairy Milk or milk powder Plant milks, such as rice and soy Yogurt, including Greek yogurt Small amounts of natural cheese (blocks of cheese) or reduced-sodium cheese can be used in moderation. (Swiss, ricotta, and fresh mozzarella cheese are lower in sodium than the others) Cream Cheese Low sodium cottage cheese  Vegetables Fresh and frozen vegetables without added sauces or salt Homemade soups (without salt) Low-sodium, salt-free or sodium-free canned vegetables and soups  Fruit Fresh and canned fruits Dried fruits, such as raisins, cranberries, and prunes  Oils Tub or liquid margarine, regular or without salt Canola, corn, peanut, olive, safflower, or sunflower oils  Condiments Fresh or dried herbs such as basil, bay leaf, dill, mustard (dry), nutmeg, paprika, parsley, rosemary, sage, or thyme.  Low sodium ketchup Vinegar  Lemon or lime juice Pepper, red pepper flakes, and cayenne. Hot sauce contains sodium, but if you use just a drop or two, it will not add up to much.  Salt-free or sodium-free seasoning mixes and marinades Simple salad dressings: vinegar and oil   Foods Not Recommended Food Group Foods Not Recommended  Grains Breads or  crackers topped with salt Cereals (hot/cold) with more than 300 mg sodium per serving Biscuits, cornbread, and other quick breads prepared with baking soda Pre-packaged bread crumbs Seasoned and packaged rice and pasta mixes Self-rising flours  Protein Foods Cured meats: Bacon, ham, sausage, pepperoni and hot dogs Canned meats (chili, vienna sausage, or sardines) Smoked fish and meats Frozen meals that have more than 600 mg of sodium per serving Egg substitute (with added sodium)  Dairy Buttermilk Processed cheese spreads Cottage cheese (1 cup may have over 500 mg of sodium; look for low-sodium.) American or feta cheese Shredded Cheese has more sodium than blocks of cheese String cheese  Vegetables Canned vegetables (unless they are salt-free, sodium-free or low sodium) Frozen vegetables with seasoning and sauces Sauerkraut and pickled vegetables Canned or dried soups (unless they are salt-free, sodium-free, or low sodium)  [place] fries and onion rings  Fruit Dried fruits preserved with additives that have sodium  Oils Salted butter or margarine, all types of olives  Condiments Salt, sea salt, kosher salt, onion salt, and garlic salt Seasoning mixes with salt Bouillon cubes Ketchup Barbeque sauce and Worcestershire sauce unless low sodium Soy sauce Salsa, pickles, olives, relish Salad dressings: ranch, blue cheese, [place], and [place].   Low Sodium Sample 1-Day Menu  Breakfast 1 cup cooked oatmeal  1 slice whole wheat bread toast  1 tablespoon peanut butter without salt  1 banana  1 cup 1% milk  Lunch Tacos made with: 2 corn tortillas   cup black beans, low sodium   cup roasted or grilled chicken (without skin)   avocado  Squeeze of lime juice  1 cup salad greens  1 tablespoon low-sodium salad dressing   cup strawberries  1 orange  Afternoon Snack 1/3 cup grapes  6 ounces yogurt  Evening Meal 3 ounces herb-baked fish  1 baked potato  2 teaspoons olive oil    cup cooked carrots  2 thick slices tomatoes on:  2 lettuce leaves  1 teaspoon olive oil  1 teaspoon balsamic vinegar  1 cup 1% milk  Evening Snack 1 apple   cup almonds without salt   Low-Sodium Vegetarian (Lacto-Ovo) Sample 1-Day Menu  Breakfast 1 cup cooked oatmeal  1 slice whole wheat toast  1 tablespoon peanut butter without salt  1 banana  1 cup 1% milk  Lunch Tacos made with: 2 corn tortillas   cup black beans, low sodium   cup roasted or grilled chicken (without skin)   avocado  Squeeze of lime juice  1 cup salad greens  1 tablespoon low-sodium salad dressing   cup strawberries  1 orange  Evening Meal Stir fry made with:  cup tofu  1 cup brown rice   cup broccoli   cup green beans   cup peppers   tablespoon peanut oil  1 orange  1 cup 1% milk  Evening Snack 4 strips celery  2 tablespoons hummus  1 hard-boiled egg   Low-Sodium Vegan Sample 1-Day Menu  Breakfast 1 cup cooked oatmeal  1 tablespoon peanut butter without salt  1 cup blueberries  1 cup soymilk fortified with calcium, vitamin B12, and vitamin D  Lunch 1 small whole wheat pita   cup cooked lentils  2 tablespoons hummus  4 carrot sticks  1 medium apple  1 cup soymilk fortified with calcium, vitamin B12, and vitamin D  Evening Meal Stir fry made with:  cup tofu  1 cup brown rice   cup broccoli   cup green beans   cup peppers   tablespoon peanut oil  1 cup cantaloupe  Evening Snack 1 cup soy yogurt   cup mixed nuts  Copyright 2020  Academy of Nutrition and Dietetics. All rights reserved  Sodium Free Flavoring Tips  When cooking, the following items may be used for flavoring instead of salt or seasonings that contain sodium. Remember: A little bit of spice goes a long way! Be careful not to overseason. Spice Blend Recipe (makes about ? cup) 5 teaspoons onion powder  2 teaspoons garlic powder  2 teaspoons paprika  2 teaspoon dry mustard  1 teaspoon crushed thyme  leaves   teaspoon white pepper   teaspoon celery seed Food Item Flavorings  Beef Basil, bay leaf, caraway, curry, dill, dry mustard, garlic, grape jelly, green pepper, mace, marjoram, mushrooms (fresh), nutmeg, onion or onion powder,  parsley, pepper, rosemary, sage  Chicken Basil, cloves, cranberries, mace, mushrooms (fresh), nutmeg, oregano, paprika, parsley, pineapple, saffron, sage, savory, tarragon, thyme, tomato, turmeric  Egg Chervil, curry, dill, dry mustard, garlic or garlic powder, green pepper, jelly, mushrooms (fresh), nutmeg, onion powder, paprika, parsley, rosemary, tarragon, tomato  Fish Basil, bay leaf, chervil, curry, dill, dry mustard, green pepper, lemon juice, marjoram, mushrooms (fresh), paprika, pepper, tarragon, tomato, turmeric  Lamb Cloves, curry, dill, garlic or garlic powder, mace, mint, mint jelly, onion, oregano, parsley, pineapple, rosemary, tarragon, thyme  Pork Applesauce, basil, caraway, chives, cloves, garlic or garlic powder, onion or onion powder, rosemary, thyme  Veal Apricots, basil, bay leaf, currant jelly, curry, ginger, marjoram, mushrooms (fresh), oregano, paprika  Vegetables Basil, dill, garlic or garlic powder, ginger, lemon juice, mace, marjoram, nutmeg, onion or onion powder, tarragon, tomato, sugar or sugar substitute, salt-free salad dressing, vinegar  Desserts Allspice, anise, cinnamon, cloves, ginger, mace, nutmeg, vanilla extract, other extracts   Copyright 2020  Academy of Nutrition and Dietetics. All rights reserved  Fluid Restricted Nutrition Therapy  You have been prescribed this diet because your condition affects how much fluid you can eat or drink. If your heart, liver, or kidneys aren't working properly, you may not be able to effectively eliminate fluids from the body and this may cause swelling (edema) in the legs, arms, and/or stomach. Drink no more than _________ liters or ________ ounces or ________cups of fluid per day.  You don't  need to stop eating or drinking the same fluids you normally would, but you may need to eat or drink less than usual.  Your registered dietitian nutritionist will help you determine the correct amount of fluid to consume during the day Breakfast Include fluids taken with medications  Lunch Include fluids taken with medications  Dinner Include fluids taken with medications  Bedtime Snack Include fluids taken with medications     Tips What Are Fluids?  A fluid is anything that is liquid or anything that would melt if left at room temperature. You will need to count these foods and liquids--including any liquid used to take medication--as part of your daily fluid intake. Some examples are: Alcohol (drink only with your doctor's permission)  Coffee, tea, and other hot beverages  Gelatin (Jell-O)  Gravy  Ice cream, sherbet, sorbet  Ice cubes, ice chips  Milk, liquid creamer  Nutritional supplements  Popsicles  Vegetable and fruit juices; fluid in canned fruit  Watermelon  Yogurt  Soft drinks, lemonade, limeade  Soups  Syrup How Do I Measure My Fluid Intake? Record your fluid intake daily.  Tip: Every day, each time you eat or drink fluids, pour water in the same amount into an empty container that can hold the same amount of fluids you are allowed daily. This may help you keep track of how much fluid you are taking in throughout the day.  To accurately keep track of how much liquid you take in, measure the size of the cups, glasses, and bowls you use. If you eat soup, measure how much of it is liquid and how much is solid (such as noodles, vegetables, meat). Conversions for Measuring Fluid Intake  Milliliters (mL) Liters (L) Ounces (oz) Cups (c)  1000 1 32 4  1200 1.2 40 5  1500 1.5 50 6 1/4  1800 1.8 60 7 1/2  2000 2 67 8 1/3  Tips to Reduce Your Thirst Chew gum or suck on hard candy.  Rinse or gargle with mouthwash. Do  not swallow.  Ice chips or popsicles my help quench thirst,  but this too needs to be calculated into the total restriction. Melt ice chips or cubes first to figure out how much fluid they produce (for example, experiment with melting  cup ice chips or 2 ice cubes).  Add a lemon wedge to your water.  Limit how much salt you take in. A high salt intake might make you thirstier.  Don't eat or drink all your allowed liquids at once. Space your liquids out through the day.  Use small glasses and cups and sip slowly. If allowed, take your medications with fluids you eat or drink during a meal.   Fluid-Restricted Nutrition Therapy Sample 1-Day Menu  Breakfast 1 slice wheat toast  1 tablespoon peanut butter  1/2 cup yogurt (120 milliliters)  1/2 cup blueberries  1 cup milk (240 milliliters)   Lunch 3 ounces sliced [place]  2 slices whole wheat bread  1/2 cup lettuce for sandwich  2 slices tomato for sandwich  1 ounce reduced-fat, reduced-sodium cheese  1/2 cup fresh carrot sticks  1 banana  1 cup unsweetened tea (240 milliliters)   Evening Meal 8 ounces soup (240 milliliters)  3 ounces salmon  1/2 cup quinoa  1 cup green beans  1 cup mixed greens salad  1 tablespoon olive oil  1 cup coffee (240 milliliters)  Evening Snack 1/2 cup sliced peaches  1/2 cup frozen yogurt (120 milliliters)  1 cup water (240 milliliters)  Copyright 2020  Academy of Nutrition and Dietetics. All rights reserved

## 2021-10-30 NOTE — Progress Notes (Signed)
Mobility Specialist Progress Note    10/30/21 1408  Mobility  Activity Ambulated in room  Level of Assistance Contact guard assist, steadying assist  Assistive Device Cane (railings)  Distance Ambulated (ft) 160 ft  Mobility Ambulated with assistance in room  Mobility Response Tolerated fair  Mobility performed by Mobility specialist  $Mobility charge 1 Mobility   Pt received in chair and agreeable. No complaints while walking. Utilized sink and end of bed with cane while walking. Returned to sitting EOB with call bell in reach.   [facility] Mobility Specialist  M.S. Primary Phone: [phone] M.S. Secondary Phone: [phone]

## 2021-10-30 NOTE — Progress Notes (Signed)
ANTICOAGULATION CONSULT NOTE - Follow Up Consult  Pharmacy Consult for Warfarin Indication: atrial fibrillation  No Known Allergies  Patient Measurements: Weight: 105.4 kg (232 lb 4.8 oz)  Vital Signs: Temp: 99 F (37.2 C) (12/22 0512) Temp Source: Oral (12/22 0512) BP: 151/76 (12/22 0512) Pulse Rate: 71 (12/22 0512)  Labs: Recent Labs    10/28/21 2112 10/30/21 0248  HGB 11.6* 10.6*  HCT 39.3 34.2*  PLT 166 135*  LABPROT 22.1* 22.8*  INR 1.9* 2.0*  CREATININE 1.50* 1.45*    Estimated Creatinine Clearance: 55 mL/min (A) (by C-G formula based on SCr of 1.45 mg/dL (H)).   Assessment:  Anticoag: h/o Afib, pta on Coumadin - Hgb down 11.6>10.6. Plts down to 125. - INR 2  - PTA warf dose: 3mg  qTues/Sat, 6mg  all other days  , Admit INR 1.9  Goal of Therapy:  INR 2-3 Monitor platelets by anticoagulation protocol: Yes   Plan:  Coumadin 5mg  po x 1 tonight Daily INR   Charniece Venturino S. , PharmD, BCPS Clinical Staff Pharmacist [email or web address]  , [place] Hao Stillinger 10/30/2021,8:31 AM

## 2021-10-30 NOTE — Progress Notes (Addendum)
°   10/30/21 2215  Vitals  Temp 98.5 F (36.9 C)  BP (!) 176/74  MAP (mmHg) 100  ECG Heart Rate 65  Level of Consciousness  Level of Consciousness Alert  MEWS COLOR  MEWS Score Color Green  Oxygen Therapy  SpO2 92 %  O2 Device Room Air  Pain Assessment  Pain Scale 0-10  Pain Score 0  MEWS Score  MEWS Temp 0  MEWS Systolic 0  MEWS Pulse 0  MEWS RR 0  MEWS LOC 0  MEWS Score 0    Patient had 15 beat run of Vtach per CCMD patient assessed and asymptomatic.Md made aware.

## 2021-10-30 NOTE — Progress Notes (Signed)
Initial Nutrition Assessment  DOCUMENTATION CODES:  Obesity unspecified  INTERVENTION:  Add Ensure Max po BID, each supplement provides 150 kcal and 30 grams of protein.    Add MVI with minerals daily.  Encourage PO and supplement intake.  NUTRITION DIAGNOSIS:  Increased nutrient needs related to acute illness (CHF exacerbation) as evidenced by estimated needs.  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  He has gained 33 pounds in 1 month  RD working remotely. Attempted to call patient's room phone. Pt did no answer.  Per Epic, pt ate 100% of breakfast this morning.   Per H&P, patient no changes in appetite.  Pt's weight appears consistent except for the past month given the 30 lb weight gain from CHF.  Of note, pt with mild BLE edema.  Added "Low Sodium Nutrition Therapy" handout from the Academy of Nutrition and Dietetics to discharge summary for patient use.  Medications: reviewed; colace BID, Lasix BID, SSI, Semglee, Warfarin  Labs: reviewed; CBG 128-194 (H) HbA1c: 8.2% (06/13/2021)  NUTRITION - FOCUSED PHYSICAL EXAM: Unable to perform - defer to follow-up  Diet Order:   Diet Order             Diet heart healthy/carb modified Room service appropriate? Yes; Fluid consistency: Thin; Fluid restriction: 1500 mL Fluid  Diet effective now                  EDUCATION NEEDS:  No education needs have been identified at this time  Skin:  Skin Assessment: Skin Integrity  Issues: Skin Integrity Issues:: Stage I Stage I: R buttocks  Last BM:  10/28/21  Height:  Ht Readings from Last 1 Encounters:  07/28/21 5\' 8"  (1.727 m)   Weight:  Wt Readings from Last 1 Encounters:  10/30/21 105.4 kg   BMI:  Body mass index is 35.32 kg/m.  Estimated Nutritional Needs:  Kcal:  2100-2300 Protein:  110-125 grams Fluid:  1.5 L fluid restriction per MD  11/01/21, RD, LDN (she/her/hers) Clinical Inpatient Dietitian RD Pager/After-Hours/Weekend Pager # in [place]

## 2021-10-30 NOTE — Consult Note (Addendum)
Advanced Heart Failure Team Consult Note   Primary Physician: Ngetich, Nelda Bucks, NP PCP-Cardiologist:  None  Reason for Consultation: A/C Combined HF  HPI:    [name] is seen today for evaluation of heart failure at the request of Dr Doristine Bosworth.   [name] isa 70 year old with a history of  PAF, severe depression, LV thrombus, DM2, CKD stage III, obesity, OSA refused sleep study, and chronic systolic HF.   He was admitted 6/30-05/16/14 with dyspena and LE edema. Found to be in afib with RVR. Diuresed with IV lasix and d/c weight 234 lbs.    Admitted 2/18 -01/10/2016 wit volume overload/Afib RVR  in the setting of medication compliance. Diuresed with IV lasix and transitioned to lasix 80 mg twice a day. Had TEE but  DC-CV was cancelled due to LA thrombus. Discharge weight 189 pounds.    Admitted 5/6/-03/23/2016  A fib RVR and volume overload in the setting of medication noncompliance. Refused cath, Myoview 12/2015. EF 20% with defects noted on rest and stress images within the inferior wall and to lesser extent the septum suggest ischemic etiology. No reversibility. Restarted on HF meds. D/C with volume overload. Psychiatry met with him and he was deemed to have the ability to make decisions. Diuresed with IV lasix and transitioned to lasix 80 mg twice a day. Discharge weight was 216 pounds.    He was last seen in the HF clinic in 2021. EF had normalized.    Presented to ED with increased shortness of breath and lower extremity edema. He describes some confusion with home medications. His medications were being delivered in bubbles packs and he was taking metoprolol once a day instead of twice a day. He is unable to tell me how much diuretic he was taking. Reported 30 pound weight gain. He had 3+ lower extremity edema. CXR with bibasilar atelectasis.  BNP 1165. Diuresing with 40 mg IV lasix twice a day. Brisk diuresis noted. Negative 2.7 liters.   Creatinine 1.5>1.45   Cardiac  Testing  Echo 10/29/21 EF 45-50% Echo 2021 EF 55-60 Echo 5/17 EF 15-20%  Review of Systems: [y] = yes, '[ ]'  = no   General: Weight gain [ Y]; Weight loss '[ ]' ; Anorexia '[ ]' ; Fatigue [ Y]; Fever '[ ]' ; Chills '[ ]' ; Weakness '[ ]'   Cardiac: Chest pain/pressure '[ ]' ; Resting SOB '[ ]' ; Exertional SOB [ Y]; Orthopnea '[ ]' ; Pedal Edema [ Y]; Palpitations '[ ]' ; Syncope '[ ]' ; Presyncope '[ ]' ; Paroxysmal nocturnal dyspnea'[ ]'   Pulmonary: Cough '[ ]' ; Wheezing'[ ]' ; Hemoptysis'[ ]' ; Sputum '[ ]' ; Snoring '[ ]'   GI: Vomiting'[ ]' ; Dysphagia'[ ]' ; Melena'[ ]' ; Hematochezia '[ ]' ; Heartburn'[ ]' ; Abdominal pain '[ ]' ; Constipation '[ ]' ; Diarrhea '[ ]' ; BRBPR '[ ]'   GU: Hematuria'[ ]' ; Dysuria '[ ]' ; Nocturia'[ ]'   Vascular: Pain in legs with walking '[ ]' ; Pain in feet with lying flat '[ ]' ; Non-healing sores '[ ]' ; Stroke '[ ]' ; TIA '[ ]' ; Slurred speech '[ ]' ;  Neuro: Headaches'[ ]' ; Vertigo'[ ]' ; Seizures'[ ]' ; Paresthesias'[ ]' ;Blurred vision '[ ]' ; Diplopia '[ ]' ; Vision changes '[ ]'   Ortho/Skin: Arthritis '[ ]' ; Joint pain [ Y]; Muscle pain '[ ]' ; Joint swelling '[ ]' ; Back Pain [ Y]; Rash '[ ]'   Psych: Depression'[ ]' ; Anxiety'[ ]'   Heme: Bleeding problems '[ ]' ; Clotting disorders '[ ]' ; Anemia '[ ]'   Endocrine: Diabetes [ Y]; Thyroid dysfunction'[ ]'   Home Medications Prior to Admission medications   Medication Sig Start  Date End Date Taking? Authorizing Provider  Cyanocobalamin (VITAMIN B-12 PO) Take 1 capsule by mouth daily.   Yes [provider]  diclofenac Sodium (VOLTAREN) 1 % GEL Apply 2 g topically 4 (four) times daily. Patient taking differently: Apply 2 g topically 4 (four) times daily as needed (arthritis pain). 06/26/21  Yes Ngetich, Dinah C, NP  Ergocalciferol (VITAMIN D2 PO) Take 1 capsule by mouth daily.   Yes [provider]  furosemide (LASIX) 20 MG tablet Take 1 tablet (20 mg total) by mouth 2 (two) times daily. May take an additional 22m daily as needed. 10/21/21  Yes [place], JPocola FNP  Green Tea, Camellia sinensis, (GREEN TEA EXTRACT PO)  Take 1 Dose by mouth daily.   Yes [provider]  insulin glargine (LANTUS) 100 UNIT/ML injection Inject 0.08 mLs (8 Units total) into the skin daily. 06/26/21 10/29/21 Yes Ngetich, Dinah C, NP  KRILL OIL OMEGA-3 PO Take 1 tablet by mouth daily.   Yes [provider]  metoprolol succinate (TOPROL-XL) 100 MG 24 hr tablet TAKE 1 TABLET TWICE DAILY Patient taking differently: Take 100 mg by mouth daily. 08/11/21  Yes Dietrick Barris, DShaune Pascal MD  polyethylene glycol powder (GLYCOLAX/MIRALAX) 17 GM/SCOOP powder Take 17 g by mouth daily. Hold for loose stool Patient taking differently: Take 17 g by mouth daily as needed for mild constipation. Hold for loose stool 06/26/21  Yes Ngetich, Dinah C, NP  potassium chloride (KLOR-CON 10) 10 MEQ tablet Take 2 tablets (20 mEq total) by mouth 2 (two) times daily. Patient taking differently: Take 10 mEq by mouth daily. 06/26/21 10/29/21 Yes Ngetich, Dinah C, NP  Sodium Hyaluronate, oral, (HYALURONIC ACID PO) Take 1 tablet by mouth daily.   Yes [provider]  Vitamin D, Ergocalciferol, (DRISDOL) 1.25 MG (50000 UNIT) CAPS capsule TAKE 1 CGrand Canyon VillagePatient taking differently: Take 50,000 Units by mouth once a week. 09/09/21  Yes Ngetich, Dinah C, NP  warfarin (COUMADIN) 6 MG tablet Take 1 tablet (6 mg total) by mouth daily. Take 2-1 tablets daily. Patient taking differently: Take 3-6 mg by mouth See admin instructions. Take 383mon Tuesdays and Saturdays, then 74m60mn other days. 07/24/21  Yes Camnitz, WilOcie DoyneD  blood glucose meter kit and supplies Dispense based on patient and insurance preference. Use up to four times daily as directed. (FOR ICD-10 E10.9, E11.9). 10/01/21   GreGerlene FeeP  sertraline (ZOLOFT) 25 MG tablet Take 1 tablet (25 mg total) by mouth daily. Patient not taking: Reported on 10/29/2021 06/26/21 09/24/21  Ngetich, DinNelda BucksP    Past Medical History: Past Medical History:  Diagnosis Date    [facility] TEE-DC-CV; succesful; large LA 6.2 cm   ADHD    Ascites    Athlete's foot    Chronic systolic heart failure ([facility]  [place] 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, unspecified CKD stage, unspecified whether long term insulin use (HCC)    Heart murmur    HTN (hypertension)    OSA (obstructive sleep apnea)    PVD (peripheral vascular disease) (HCC)    s/p great toe amputation    Past Surgical History: Past Surgical History:  Procedure Laterality Date   ABDOMINAL  AORTOGRAM W/LOWER EXTREMITY N/A 05/19/2021   Procedure: ABDOMINAL AORTOGRAM W/LOWER EXTREMITY;  Surgeon: Marty Heck, MD;  Location: [place] [facility];  Service: Cardiovascular;  Laterality: N/A;   AMPUTATION Left 05/22/2021   Procedure: LEFT FIFTH TOE AMPUTATION;  Surgeon: Marty Heck, MD;  Location: [place];  Service: Vascular;  Laterality: Left;   CARDIOVERSION N/A 06/04/2014   Procedure: CARDIOVERSION;  Surgeon: Larey Dresser, MD;  Location: [place];  Service: Cardiovascular;  Laterality: N/A;   NOSE SURGERY     Nasal septum surgery   PERIPHERAL VASCULAR INTERVENTION Left 05/19/2021   Procedure: PERIPHERAL VASCULAR INTERVENTION;  Surgeon: Marty Heck, MD;  Location: [place] [facility];  Service: Cardiovascular;  Laterality: Left;   TEE WITHOUT CARDIOVERSION N/A 06/04/2014   Procedure: TRANSESOPHAGEAL ECHOCARDIOGRAM (TEE);  Surgeon: Larey Dresser, MD;  Location: [place];  Service: Cardiovascular;  Laterality: N/A;   TEE WITHOUT CARDIOVERSION N/A 01/06/2016   Procedure: TRANSESOPHAGEAL ECHOCARDIOGRAM (TEE);  Surgeon: Fay Records, MD;  Location[facility];  Service: Cardiovascular;  Laterality: N/A;   VASECTOMY      Family History: Family History  Problem  Relation Age of Onset   Heart attack Mother        deceased   Diabetes Mother    Heart attack Sister     Social History: Social History   Socioeconomic History   Marital status: Single    Spouse name: Not on file   Number of children: Not on file   Years of education: Not on file   Highest education level: Not on file  Occupational History   Occupation: retired  Tobacco Use   Smoking status: Some Days    Years: 52.00    Types: Cigarettes, Cigars    Last attempt to quit: 05/2021    Years since quitting: 0.4   Smokeless tobacco: Never  Substance and Sexual Activity   Alcohol use: Not Currently   Drug use: Never   Sexual activity: Not on file  Other Topics Concern   Not on file  Social History Narrative   ** Merged History Encounter ** Lives in [place] by himself. Retired from [employer] and [employer] for [employer].       Tobacco use, amount per day now:   Past tobacco use, amount per day:   How many years did you use tobacco:   Alcohol use (drinks per week): N/A   Diet:   Do you drink/eat things with caffeine:   Marital status:  Divorced                                What year were you married? 2003   Do you live in a house, apartment, assisted living, condo, trailer, etc.? House   Is it one or more stories? One   How many persons live in your home?   Do you have pets in your home?( please list) N/A   Highest Level of education completed? Bachelors Degree   Current or past profession: Teacher-Special Ed   Do you exercise?  Yes                                Type and how often? Daily squats, and push ups.   Do you have a living will? No   Do you have a DNR form?  No  If not, do you want to discuss one?   Do you have signed POA/HPOA forms?  No                      If so, please bring to you appointment      Do you have any difficulty bathing or dressing yourself? Bathing only if pants not shirts/not shorts.   Do you have any  difficulty preparing food or eating? No   Do you have any difficulty managing your medications? No   Do you have any difficulty managing your finances? No   Do you have any difficulty affording your medications? No   Social Determinants of Health   Financial Resource Strain: Not on file  Food Insecurity: Not on file  Transportation Needs: Not on file  Physical Activity: Not on file  Stress: Not on file  Social Connections: Not on file    Allergies:  No Known Allergies  Objective:    Vital Signs:   Temp:  [97.7 F (36.5 C)-99 F (37.2 C)] 98 F (36.7 C) (12/22 0839) Pulse Rate:  [59-78] 61 (12/22 0839) Resp:  [15-18] 17 (12/22 0839) BP: (142-178)/(67-88) 142/88 (12/22 0839) SpO2:  [88 %-94 %] 90 % (12/22 0839) Weight:  [105.4 kg] 105.4 kg (12/22 0512) Last BM Date: 10/28/21  Weight change: Filed Weights   10/30/21 0512  Weight: 105.4 kg    Intake/Output:   Intake/Output Summary (Last 24 hours) at 10/30/2021 1032 Last data filed at 10/30/2021 0934 Gross per 24 hour  Intake 440 ml  Output 3333 ml  Net -2893 ml      Physical Exam    General:   No resp difficulty. Sitting in the chair  HEENT: normal Neck: supple. JVP to jaw  . Carotids 2+ bilat; no bruits. No lymphadenopathy or thyromegaly appreciated. Cor: PMI nondisplaced. Regular rate & rhythm. No rubs, gallops or murmurs. Lungs: clear Abdomen: soft, nontender, distended. No hepatosplenomegaly. No bruits or masses. Good bowel sounds. Extremities: no cyanosis, clubbing, rash, R and LLE 3+ edema Neuro: alert & orientedx3, cranial nerves grossly intact. moves all 4 extremities w/o difficulty. Affect pleasant   Telemetry   SR 60 personally reviewed   EKG    SR 64 bpm personally reviewed.   Labs   Basic Metabolic Panel: Recent Labs  Lab 10/28/21 2112 10/30/21 0248  NA 140 137  K 4.3 3.6  CL 106 104  CO2 28 25  GLUCOSE 206* 124*  BUN 28* 22  CREATININE 1.50* 1.45*  CALCIUM 9.0 8.4*  MG 2.4   --     Liver Function Tests: Recent Labs  Lab 10/28/21 2112  AST 23  ALT 35  ALKPHOS 41  BILITOT 1.1  PROT 7.3  ALBUMIN 3.6   No results for input(s): LIPASE, AMYLASE in the last 168 hours. No results for input(s): AMMONIA in the last 168 hours.  CBC: Recent Labs  Lab 10/28/21 2112 10/30/21 0248  WBC 6.2 6.1  NEUTROABS 4.4  --   HGB 11.6* 10.6*  HCT 39.3 34.2*  MCV 94.9 91.4  PLT 166 135*    Cardiac Enzymes: No results for input(s): CKTOTAL, CKMB, CKMBINDEX, TROPONINI in the last 168 hours.  BNP: BNP (last 3 results) Recent Labs    10/28/21 2112  BNP 1,165.7*    ProBNP (last 3 results) No results for input(s): PROBNP in the last 8760 hours.   CBG: Recent Labs  Lab 10/29/21 1210 10/29/21 1612 10/29/21 2107 10/30/21 0600  GLUCAP 133* 194* 161* 153*    Coagulation Studies: Recent Labs    10/28/21 01/24/2111 10/30/21 0248  LABPROT 22.1* 22.8*  INR 1.9* 2.0*     Imaging   ECHOCARDIOGRAM COMPLETE  Result Date: 10/29/2021    ECHOCARDIOGRAM REPORT   Patient Name:   [name] Date of Exam: 10/29/2021 Medical Rec #:  [id number]         Height:       68.0 in Accession #:    [id number]        Weight:       202.2 lb Date of Birth:  [birth year]        BSA:          2.053 m Patient Age:    8 years          BP:           178/87 mmHg Patient Gender: M                 HR:           61 bpm. Exam Location:  Inpatient Procedure: 2D Echo Indications:    acute systolic chf  History:        Patient has prior history of Echocardiogram examinations, most                 recent 08/13/2020. Chronic kidney disease, Arrythmias:Atrial                 Fibrillation; Risk Factors:Diabetes and Hypertension.  Sonographer:    Johny Chess RDCS Referring Phys: [place]  1. Left ventricular ejection fraction, by estimation, is 45 to 50%. The left ventricle has mildly decreased function. The left ventricle demonstrates regional wall motion abnormalities (see  scoring diagram/findings for description). Left ventricular diastolic parameters are consistent with Grade II diastolic dysfunction (pseudonormalization). Elevated left atrial pressure. There is mild global left ventricular hypokinesis, slightly worse in the basal and mid segments of the inferior and inferolateral walls.  2. Right ventricular systolic function is moderately reduced. The right ventricular size is moderately enlarged. There is severely elevated pulmonary artery systolic pressure. The estimated right ventricular systolic pressure is 93.7 mmHg.  3. Left atrial size was moderately dilated.  4. Right atrial size was moderately dilated.  5. The mitral valve is normal in structure. No evidence of mitral valve regurgitation.  6. Tricuspid valve regurgitation is mild to moderate.  7. The aortic valve is tricuspid. Aortic valve regurgitation is not visualized. No aortic stenosis is present.  8. The inferior vena cava is dilated in size with <50% respiratory variability, suggesting right atrial pressure of 15 mmHg. Comparison(s): Prior images reviewed side by side. The overall left ventricular function is worsened. The left ventricular diastolic function is unchanged. The left ventricular wall motion abnormalities are unchanged. The striking changes involve marked worsening of the pulmonary hypertension, tricuspid insufficiency and right ventricular dysfunction. FINDINGS  Left Ventricle: Left ventricular ejection fraction, by estimation, is 45 to 50%. The left ventricle has mildly decreased function. The left ventricle demonstrates regional wall motion abnormalities. The left ventricular internal cavity size was normal in size. There is no left ventricular hypertrophy. Left ventricular diastolic parameters are consistent with Grade II diastolic dysfunction (pseudonormalization). Elevated left atrial pressure.  LV Wall Scoring: The inferior wall and posterior wall are hypokinetic. There is mild global left  ventricular hypokinesis, slightly worse in the basal and mid segments of the inferior and inferolateral walls. Right Ventricle: The  right ventricular size is moderately enlarged. No increase in right ventricular wall thickness. Right ventricular systolic function is moderately reduced. There is severely elevated pulmonary artery systolic pressure. The tricuspid regurgitant velocity is 4.17 m/s, and with an assumed right atrial pressure of 15 mmHg, the estimated right ventricular systolic pressure is 20.1 mmHg. Left Atrium: Left atrial size was moderately dilated. Right Atrium: Right atrial size was moderately dilated. Pericardium: There is no evidence of pericardial effusion. Mitral Valve: The mitral valve is normal in structure. No evidence of mitral valve regurgitation. Tricuspid Valve: The tricuspid valve is grossly normal. Tricuspid valve regurgitation is mild to moderate. Aortic Valve: The aortic valve is tricuspid. Aortic valve regurgitation is not visualized. No aortic stenosis is present. Pulmonic Valve: The pulmonic valve was normal in structure. Pulmonic valve regurgitation is not visualized. Aorta: The aortic root and ascending aorta are structurally normal, with no evidence of dilitation. Venous: The inferior vena cava is dilated in size with less than 50% respiratory variability, suggesting right atrial pressure of 15 mmHg. IAS/Shunts: No atrial level shunt detected by color flow Doppler.  LEFT VENTRICLE PLAX 2D LVIDd:         5.40 cm      Diastology LVIDs:         3.40 cm      LV e' medial:    4.68 cm/s LV PW:         1.10 cm      LV E/e' medial:  18.8 LV IVS:        0.90 cm      LV e' lateral:   8.05 cm/s LVOT diam:     2.30 cm      LV E/e' lateral: 10.9 LV SV:         65 LV SV Index:   32 LVOT Area:     4.15 cm  LV Volumes (MOD) LV vol d, MOD A2C: 116.0 ml LV vol s, MOD A2C: 52.7 ml LV SV MOD A2C:     63.3 ml RIGHT VENTRICLE             IVC RV S prime:     12.00 cm/s  IVC diam: 2.50 cm TAPSE  (M-mode): 1.7 cm LEFT ATRIUM             Index        RIGHT ATRIUM           Index LA diam:        4.80 cm 2.34 cm/m   RA Area:     21.50 cm LA Vol (A2C):   99.2 ml 48.31 ml/m  RA Volume:   57.30 ml  27.91 ml/m LA Vol (A4C):   81.8 ml 39.84 ml/m LA Biplane Vol: 90.6 ml 44.12 ml/m  AORTIC VALVE LVOT Vmax:   83.90 cm/s LVOT Vmean:  51.400 cm/s LVOT VTI:    0.156 m  AORTA Ao Root diam: 2.90 cm Ao Asc diam:  3.20 cm MITRAL VALVE               TRICUSPID VALVE MV Area (PHT): 3.60 cm    TR Peak grad:   69.6 mmHg MV Decel Time: 211 msec    TR Vmax:        417.00 cm/s MV E velocity: 87.90 cm/s MV A velocity: 28.40 cm/s  SHUNTS MV E/A ratio:  3.10        Systemic VTI:  0.16 m  Systemic Diam: 2.30 cm Mihai Croitoru MD Electronically signed by Sanda Klein MD Signature Date/Time: 10/29/2021/3:59:18 PM    Final      Medications:     Current Medications:  docusate sodium  100 mg Oral BID   furosemide  40 mg Intravenous BID   insulin aspart  0-15 Units Subcutaneous TID WC   insulin aspart  0-5 Units Subcutaneous QHS   insulin glargine-yfgn  8 Units Subcutaneous Daily   metoprolol succinate  100 mg Oral BID   sodium chloride flush  3 mL Intravenous Q12H   triamcinolone 0.1 % cream : eucerin   Topical BID   warfarin  5 mg Oral ONCE-1600   Warfarin - Pharmacist Dosing Inpatient   Does not apply q1600    Infusions:     Patient Profile   [name] isa 70 year old with a history of  PAF, severe depression, DM2, CKD stage III, obesity, OSA and chronic systolic HF.  Admitted with volume overload.   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 I suspect due to medication errors. -On exam he is warm + wet.  - Echo this admit EF 45-50%. Previously EF had normalized.  - Increase lasix 80 mg twice a day .  - Follow renal function daily - Add unna boots.   2. CKD Stage IIIa Creatinine on admit 1.5-->1.45  Follow BMET daily   2. DMII Hgb A1C 8.2.  On SSI  3. HTN Stable.   4. PAF -In SR.  Continue metoprolol 100 mg twice a day  - on coumadin   5. H/O LV Thrombus On coumadin      Length of Stay: 0  Darrick Grinder, NP  10/30/2021, 10:32 AM  Advanced Heart Failure Team Pager [phone] (M-F; 7a - 5p)  Please contact [facility] for night-coverage after hours (4p -7a ) and weekends on [email or web address]  Patient seen and examined with the above-signed Advanced Practice Provider and/or Housestaff. I personally reviewed laboratory data, imaging studies and relevant notes. I independently examined the patient and formulated the important aspects of the plan. I have edited the note to reflect any of my changes or salient points. I have personally discussed the plan with the patient and/or family.  Patient admitted with a/c diastolic HF with ~94 pound weight gain. Modest diuresis so far. Symptoms improving.   Echo reviewed EF ~50%   General:  Well appearing. No resp difficulty HEENT: normal Neck: supple. JVP to jaw. Carotids 2+ bilat; no bruits. No lymphadenopathy or thryomegaly appreciated. Cor: PMI nondisplaced. Regular rate & rhythm. No rubs, gallops or murmurs. Lungs: clear Abdomen: obese  soft, nontender, nondistended. No hepatosplenomegaly. No bruits or masses. Good bowel sounds. Extremities: no cyanosis, clubbing, rash, 2+ edema Neuro: alert & orientedx3, cranial nerves grossly intact. moves all 4 extremities w/o difficulty. Affect pleasant  Agree with increasing IV lasix. Watch renal function. Titrate GDMT with spiro and SGLT2i as tolerated. If diuresis stalls can consider RHC.   Glori Bickers, MD  6:55 PM

## 2021-10-30 NOTE — Significant Event (Signed)
Pt with 15 beat run of VT, asymptomatic.  Last night had 5 beat run of VT, also asymptomatic.  Will check stat BMP and Mg given diuresis to make sure theres no obvious electrolyte abnormalities to correct.

## 2021-10-31 ENCOUNTER — Telehealth (HOSPITAL_COMMUNITY): Payer: Self-pay

## 2021-10-31 ENCOUNTER — Other Ambulatory Visit (HOSPITAL_COMMUNITY): Payer: Self-pay

## 2021-10-31 DIAGNOSIS — R0902 Hypoxemia: Secondary | ICD-10-CM | POA: Diagnosis not present

## 2021-10-31 DIAGNOSIS — I5043 Acute on chronic combined systolic (congestive) and diastolic (congestive) heart failure: Secondary | ICD-10-CM | POA: Diagnosis present

## 2021-10-31 DIAGNOSIS — E1151 Type 2 diabetes mellitus with diabetic peripheral angiopathy without gangrene: Secondary | ICD-10-CM | POA: Diagnosis present

## 2021-10-31 DIAGNOSIS — Z833 Family history of diabetes mellitus: Secondary | ICD-10-CM | POA: Diagnosis not present

## 2021-10-31 DIAGNOSIS — L89321 Pressure ulcer of left buttock, stage 1: Secondary | ICD-10-CM | POA: Diagnosis present

## 2021-10-31 DIAGNOSIS — E876 Hypokalemia: Secondary | ICD-10-CM | POA: Diagnosis present

## 2021-10-31 DIAGNOSIS — L89311 Pressure ulcer of right buttock, stage 1: Secondary | ICD-10-CM | POA: Diagnosis present

## 2021-10-31 DIAGNOSIS — J9811 Atelectasis: Secondary | ICD-10-CM | POA: Diagnosis present

## 2021-10-31 DIAGNOSIS — H547 Unspecified visual loss: Secondary | ICD-10-CM | POA: Diagnosis present

## 2021-10-31 DIAGNOSIS — I4891 Unspecified atrial fibrillation: Secondary | ICD-10-CM | POA: Diagnosis not present

## 2021-10-31 DIAGNOSIS — I495 Sick sinus syndrome: Secondary | ICD-10-CM | POA: Diagnosis not present

## 2021-10-31 DIAGNOSIS — D72829 Elevated white blood cell count, unspecified: Secondary | ICD-10-CM | POA: Diagnosis present

## 2021-10-31 DIAGNOSIS — I10 Essential (primary) hypertension: Secondary | ICD-10-CM | POA: Diagnosis not present

## 2021-10-31 DIAGNOSIS — F32A Depression, unspecified: Secondary | ICD-10-CM | POA: Diagnosis present

## 2021-10-31 DIAGNOSIS — I4819 Other persistent atrial fibrillation: Secondary | ICD-10-CM | POA: Diagnosis present

## 2021-10-31 DIAGNOSIS — Z6835 Body mass index (BMI) 35.0-35.9, adult: Secondary | ICD-10-CM | POA: Diagnosis not present

## 2021-10-31 DIAGNOSIS — Z20822 Contact with and (suspected) exposure to covid-19: Secondary | ICD-10-CM | POA: Diagnosis present

## 2021-10-31 DIAGNOSIS — I714 Abdominal aortic aneurysm, without rupture, unspecified: Secondary | ICD-10-CM | POA: Diagnosis present

## 2021-10-31 DIAGNOSIS — I484 Atypical atrial flutter: Secondary | ICD-10-CM | POA: Diagnosis present

## 2021-10-31 DIAGNOSIS — N1831 Chronic kidney disease, stage 3a: Secondary | ICD-10-CM | POA: Diagnosis present

## 2021-10-31 DIAGNOSIS — I130 Hypertensive heart and chronic kidney disease with heart failure and stage 1 through stage 4 chronic kidney disease, or unspecified chronic kidney disease: Secondary | ICD-10-CM | POA: Diagnosis present

## 2021-10-31 DIAGNOSIS — G4733 Obstructive sleep apnea (adult) (pediatric): Secondary | ICD-10-CM | POA: Diagnosis present

## 2021-10-31 DIAGNOSIS — N179 Acute kidney failure, unspecified: Secondary | ICD-10-CM | POA: Diagnosis present

## 2021-10-31 DIAGNOSIS — E1122 Type 2 diabetes mellitus with diabetic chronic kidney disease: Secondary | ICD-10-CM | POA: Diagnosis present

## 2021-10-31 DIAGNOSIS — E669 Obesity, unspecified: Secondary | ICD-10-CM | POA: Diagnosis present

## 2021-10-31 DIAGNOSIS — F909 Attention-deficit hyperactivity disorder, unspecified type: Secondary | ICD-10-CM | POA: Diagnosis present

## 2021-10-31 LAB — GLUCOSE, CAPILLARY
Glucose-Capillary: 136 mg/dL — ABNORMAL HIGH (ref 70–99)
Glucose-Capillary: 233 mg/dL — ABNORMAL HIGH (ref 70–99)
Glucose-Capillary: 163 mg/dL — ABNORMAL HIGH (ref 70–99)
Glucose-Capillary: 130 mg/dL — ABNORMAL HIGH (ref 70–99)
Glucose-Capillary: 154 mg/dL — ABNORMAL HIGH (ref 70–99)

## 2021-10-31 LAB — BASIC METABOLIC PANEL
Anion gap: 6 (ref 5–15)
BUN: 22 mg/dL (ref 8–23)
CO2: 32 mmol/L (ref 22–32)
Calcium: 8.6 mg/dL — ABNORMAL LOW (ref 8.9–10.3)
Chloride: 100 mmol/L (ref 98–111)
Creatinine, Ser: 1.6 mg/dL — ABNORMAL HIGH (ref 0.61–1.24)
GFR, Estimated: 46 mL/min — ABNORMAL LOW (ref >60)
Glucose, Bld: 130 mg/dL — ABNORMAL HIGH (ref 70–99)
Potassium: 3.7 mmol/L (ref 3.5–5.1)
Sodium: 138 mmol/L (ref 135–145)

## 2021-10-31 LAB — PROTIME-INR
INR: 1.7 — ABNORMAL HIGH (ref 0.8–1.2)
Prothrombin Time: 20.3 seconds — ABNORMAL HIGH (ref 11.4–15.2)

## 2021-10-31 LAB — MAGNESIUM: Magnesium: 2.3 mg/dL (ref 1.7–2.4)

## 2021-10-31 MED ORDER — DAPAGLIFLOZIN PROPANEDIOL 10 MG PO TABS
10.0000 mg | ORAL_TABLET | Freq: Every day | ORAL | Status: DC
  Administered 2021-10-31 – 2021-11-03 (×4): 10 mg via ORAL
  Filled 2021-10-31 (×5): qty 1

## 2021-10-31 MED ORDER — WARFARIN SODIUM 7.5 MG PO TABS
7.5000 mg | ORAL_TABLET | Freq: Once | ORAL | Status: AC
  Administered 2021-10-31: 17:00:00 7.5 mg via ORAL
  Filled 2021-10-31: qty 1

## 2021-10-31 MED ORDER — SACUBITRIL-VALSARTAN 24-26 MG PO TABS
1.0000 | ORAL_TABLET | Freq: Two times a day (BID) | ORAL | Status: DC
  Administered 2021-10-31 – 2021-11-06 (×14): 1 via ORAL
  Filled 2021-10-31 (×16): qty 1

## 2021-10-31 MED ORDER — POTASSIUM CHLORIDE CRYS ER 20 MEQ PO TBCR
40.0000 meq | EXTENDED_RELEASE_TABLET | Freq: Two times a day (BID) | ORAL | Status: AC
  Administered 2021-10-31 (×2): 40 meq via ORAL
  Filled 2021-10-31 (×2): qty 2

## 2021-10-31 NOTE — Progress Notes (Signed)
Physical Therapy Treatment Patient Details Name: [name] MRN: [id number] DOB: [birth year] Today's Date: 10/31/2021   History of Present Illness Pt is a 70 y.o. male admitted 10/28/21 with edema, weight gain, SOB. Workup for CHF exacerbation, AKI. PMH includes CHF, HTN, CKD, DM; admission 05/2021 for LLE vascular intervention.    PT Comments    Pt is making good progress with mobility, ambulating fairly steadily without LOB or physical assistance with a SPC up to ~260 ft. Finished session with lower extremity standing exercises to improve his strength. Pt continues to have tangential speech. Will continue to follow acutely. Current recommendations remain appropriate.   Recommendations for follow up therapy are one component of a multi-disciplinary discharge planning process, led by the attending physician.  Recommendations may be updated based on patient status, additional functional criteria and insurance authorization.  Follow Up Recommendations  No PT follow up     Assistance Recommended at Discharge Intermittent Supervision/Assistance  Equipment Recommendations  None recommended by PT    Recommendations for Other Services       Precautions / Restrictions Precautions Precautions: Fall Restrictions Weight Bearing Restrictions: No     Mobility  Bed Mobility               General bed mobility comments: Pt sitting up in recliner upon arrival.    Transfers Overall transfer level: Needs assistance Equipment used: Rolling walker (2 wheels) Transfers: Sit to/from Stand Sit to Stand: Min guard           General transfer comment: preference to keep R knee extended, pt relying heavily on UEs to push or pull up to stand, min guard. At one point, pt sat on standard chair without arm rests by obtaining a partial lunge position with R leg extended to side of chair.    Ambulation/Gait Ambulation/Gait assistance: Min guard;Supervision Gait Distance (Feet): 260  Feet (x2 bouts of ~260 ft > ~10 ft) Assistive device: Straight cane;Rolling walker (2 wheels) Gait Pattern/deviations: Step-through pattern;Decreased stride length;Trunk flexed Gait velocity: reduced Gait velocity interpretation: <1.8 ft/sec, indicate of risk for recurrent falls   General Gait Details: Initially ambulating with RW, quickly progressing to [facility] in L hand. Slow, mostly steady gait with SPC, no LOB, min guard-supervision for safety   Stairs             Wheelchair Mobility    Modified Rankin (Stroke Patients Only)       Balance Overall balance assessment: Needs assistance         Standing balance support: No upper extremity supported;Single extremity supported;During functional activity Standing balance-Leahy Scale: Fair Standing balance comment: can static stand and take steps without UE support; preference for [facility]                            Cognition Arousal/Alertness: Awake/alert Behavior During Therapy: WFL for tasks assessed/performed Overall Cognitive Status: No family/caregiver present to determine baseline cognitive functioning                                 General Comments: Tangential and verbose with speech, unrelated topics, requires frequent redirection to conversation        Exercises General Exercises - Lower Extremity Heel Raises: AROM;Strengthening;Both;20 reps;Standing (with UE support at sink) Mini-Sqauts: Strengthening;Both;20 reps;Standing (with UE support at sink)    General Comments        Pertinent  Vitals/Pain Pain Assessment: Faces Faces Pain Scale: Hurts a little bit Pain Location: hip (difficult to get pt to identify where, but appeared like R) Pain Descriptors / Indicators: Discomfort;Grimacing;Guarding Pain Intervention(s): Limited activity within patient's tolerance;Monitored during session;Repositioned    Home Living                          Prior Function            PT  Goals (current goals can now be found in the care plan section) Acute Rehab PT Goals Patient Stated Goal: to speak with a Child psychotherapist PT Goal Formulation: With patient Time For Goal Achievement: 11/12/21 Potential to Achieve Goals: Good Progress towards PT goals: Progressing toward goals    Frequency    Min 3X/week      PT Plan Current plan remains appropriate    Co-evaluation              AM-PAC PT "6 Clicks" Mobility   Outcome Measure  Help needed turning from your back to your side while in a flat bed without using bedrails?: A Little Help needed moving from lying on your back to sitting on the side of a flat bed without using bedrails?: A Little Help needed moving to and from a bed to a chair (including a wheelchair)?: A Little Help needed standing up from a chair using your arms (e.g., wheelchair or bedside chair)?: A Little Help needed to walk in hospital room?: A Little Help needed climbing 3-5 steps with a railing? : A Little 6 Click Score: 18    End of Session   Activity Tolerance: Patient tolerated treatment well Patient left: in chair;with call bell/phone within reach;with chair alarm set   PT Visit Diagnosis: Other abnormalities of gait and mobility (R26.89);Unsteadiness on feet (R26.81)     Time: 5379-4327 PT Time Calculation (min) (ACUTE ONLY): 31 min  Charges:  $Gait Training: 8-22 mins $Therapeutic Exercise: 8-22 mins                     Raymond Gurney, PT, DPT Acute Rehabilitation Services  Pager: [phone] Office: [phone]    Jewel Baize 10/31/2021, 5:48 PM

## 2021-10-31 NOTE — Progress Notes (Signed)
PROGRESS NOTE    [name]  [id number] DOB: [birth year] DOA: 10/28/2021 PCP: Sandrea Hughs, NP   Brief Narrative:  [name] is a 70 y.o. male with medical history significant of ADHD; afib on Coumadin; chronic systolic CHF; stage 3a CKD; DM; PVD s/p L 5th toe amputation; visual impairment; HTN; and OSA presented with edema, weight gain, and SOB and admitted under hospitalist service with acute on chronic combined CHF without hypoxia as well as had AKI.  He has gained 33 pounds in 1 month  Assessment & Plan:   Principal Problem:   Acute on chronic combined systolic and diastolic CHF (congestive heart failure) (HCC) Active Problems:   Type 2 diabetes mellitus with stage 3 chronic kidney disease, with long-term current use of insulin (HCC)   HTN (hypertension)   Acute-on-chronic kidney injury (HCC)   Atrial fibrillation (HCC)   Pressure injury of skin   Acute on chronic combined systolic (congestive) and diastolic (congestive) heart failure (HCC)  Acute on chronic combined CHF: Patient with known h/o chronic combined CHF presenting with worsening edema and weight gain of about 33 pounds in 1 month.  No hypoxia.  Chest x-ray negative for pulmonary edema.  Elevated BNP compared to previous.  Has significant bilateral lower extremity edema.  Repeat echo shows 45 to 50% LVEF and grade 2 diastolic dysfunction with left ventricular regional wall motion abnormality.  Patient has had significant diuresis and he has diuresed 3 L in last 24 hours and 5 L since admission.  Weight down as well.  Heart failure team managing.  Continue IV Lasix twice daily, Entresto and [place] added.    AKI: Likely cardiorenal.  Creatinine stable compared to yesterday.  Avoid nephrotoxic agents.  Essential hypertension: Elevated, continue home dose of Toprol-XL and as needed hydralazine.  [place] and Entresto added today.  Type 2 diabetes mellitus: Recent hemoglobin A1c 8.2.  Blood sugar  fluctuating, continue home dose of glargine and SSI.  Paroxysmal A. fib: Rate controlled, continue Toprol-XL and Coumadin dosed by pharmacy.  INR slightly subtherapeutic.  Note: This patient has been tested and is negative for the novel coronavirus COVID-19. He has NOT been vaccinated against COVID-19.  DVT prophylaxis:    Code Status: Full Code  Family Communication:  None present at bedside.  Plan of care discussed with patient in length and he verbalized understanding and agreed with it.  Status is: Inpatient  Remains inpatient appropriate because: Needs IV diuresis.  Estimated body mass index is 34.38 kg/m as calculated from the following:   Height as of 07/28/21: 5\' 8"  (1.727 m).   Weight as of this encounter: 102.6 kg.  Pressure Injury 10/29/21 Buttocks Right;Left Stage 1 -  Intact skin with non-blanchable redness of a localized area usually over a bony prominence. Buttock pink and non blanchable (Active)  10/29/21 1210  Location: Buttocks  Location Orientation: Right;Left  Staging: Stage 1 -  Intact skin with non-blanchable redness of a localized area usually over a bony prominence.  Wound Description (Comments): Buttock pink and non blanchable  Present on Admission: Yes    Nutritional Assessment: Body mass index is 34.38 kg/m.Marland Kitchen Seen by dietician.  I agree with the assessment and plan as outlined below: Nutrition Status: Nutrition Problem: Increased nutrient needs Etiology: acute illness (CHF exacerbation) Skin Assessment: I have examined the patient's skin and I agree with the wound assessment as performed by the wound care RN as outlined below: Pressure Injury 10/29/21 Buttocks Right;Left Stage 1 -  Intact skin with non-blanchable redness of a localized area usually over a bony prominence. Buttock pink and non blanchable (Active)  10/29/21 1210  Location: Buttocks  Location Orientation: Right;Left  Staging: Stage 1 -  Intact skin with non-blanchable redness of a  localized area usually over a bony prominence.  Wound Description (Comments): Buttock pink and non blanchable  Present on Admission: Yes    Consultants:  Cardiology/heart failure  Procedures:  None  Antimicrobials:  Anti-infectives (From admission, onward)    None          Subjective: Seen and examined.  No new complaint.  Edema improving.  Objective: Vitals:   10/31/21 0453 10/31/21 0730 10/31/21 1004 10/31/21 1353  BP: (!) 159/75 (!) 160/76 (!) 152/67 (!) 150/64  Pulse: 66 65 61 (!) 58  Resp: 20     Temp: 98.1 F (36.7 C) 98.2 F (36.8 C)  98.3 F (36.8 C)  TempSrc: Oral Oral  Oral  SpO2: 92% (!) 83%  92%  Weight: 102.6 kg       Intake/Output Summary (Last 24 hours) at 10/31/2021 1414 Last data filed at 10/31/2021 1330 Gross per 24 hour  Intake 900 ml  Output 3525 ml  Net -2625 ml    Filed Weights   10/30/21 0512 10/31/21 0453  Weight: 105.4 kg 102.6 kg    Examination:  General exam: Appears calm and comfortable  Respiratory system: Clear to auscultation. Respiratory effort normal. Cardiovascular system: S1 & S2 heard, RRR. No JVD, murmurs, rubs, gallops or clicks.  +2-3 pitting edema bilateral lower extremity. Gastrointestinal system: Abdomen is nondistended, soft and nontender. No organomegaly or masses felt. Normal bowel sounds heard. Central nervous system: Alert and oriented. No focal neurological deficits. Extremities: Symmetric 5 x 5 power. Skin: No rashes, lesions or ulcers.  Psychiatry: Judgement and insight appear normal. Mood & affect appropriate.    Data Reviewed: I have personally reviewed following labs and imaging studies  CBC: Recent Labs  Lab 10/28/21 2112 10/30/21 0248  WBC 6.2 6.1  NEUTROABS 4.4  --   HGB 11.6* 10.6*  HCT 39.3 34.2*  MCV 94.9 91.4  PLT 166 135*    Basic Metabolic Panel: Recent Labs  Lab 10/28/21 2112 10/30/21 0248 10/31/21 0250  NA 140 137 138  K 4.3 3.6 3.7  CL 106 104 100  CO2 28 25 32   GLUCOSE 206* 124* 130*  BUN 28* 22 22  CREATININE 1.50* 1.45* 1.60*  CALCIUM 9.0 8.4* 8.6*  MG 2.4  --  2.3    GFR: Estimated Creatinine Clearance: 49.2 mL/min (A) (by C-G formula based on SCr of 1.6 mg/dL (H)). Liver Function Tests: Recent Labs  Lab 10/28/21 2112  AST 23  ALT 35  ALKPHOS 41  BILITOT 1.1  PROT 7.3  ALBUMIN 3.6    No results for input(s): LIPASE, AMYLASE in the last 168 hours. No results for input(s): AMMONIA in the last 168 hours. Coagulation Profile: Recent Labs  Lab 10/28/21 2112 10/30/21 0248 10/31/21 0250  INR 1.9* 2.0* 1.7*    Cardiac Enzymes: No results for input(s): CKTOTAL, CKMB, CKMBINDEX, TROPONINI in the last 168 hours. BNP (last 3 results) No results for input(s): PROBNP in the last 8760 hours. HbA1C: No results for input(s): HGBA1C in the last 72 hours. CBG: Recent Labs  Lab 10/30/21 1557 10/30/21 2114 10/31/21 0624 10/31/21 0907 10/31/21 1134  GLUCAP 119* 179* 136* 233* 163*    Lipid Profile: No results for input(s): CHOL, HDL, LDLCALC, TRIG, CHOLHDL,  LDLDIRECT in the last 72 hours. Thyroid Function Tests: No results for input(s): TSH, T4TOTAL, FREET4, T3FREE, THYROIDAB in the last 72 hours. Anemia Panel: No results for input(s): VITAMINB12, FOLATE, FERRITIN, TIBC, IRON, RETICCTPCT in the last 72 hours. Sepsis Labs: No results for input(s): PROCALCITON, LATICACIDVEN in the last 168 hours.  Recent Results (from the past 240 hour(s))  Resp Panel by RT-PCR (Flu A&B, Covid) Nasopharyngeal Swab     Status: None   Collection Time: 10/29/21  8:26 AM   Specimen: Nasopharyngeal Swab; Nasopharyngeal(NP) swabs in vial transport medium  Result Value Ref Range Status   SARS Coronavirus 2 by RT PCR NEGATIVE NEGATIVE Final    Comment: (NOTE) SARS-CoV-2 target nucleic acids are NOT DETECTED.  The SARS-CoV-2 RNA is generally detectable in upper respiratory specimens during the acute phase of infection. The lowest concentration of  SARS-CoV-2 viral copies this assay can detect is 138 copies/mL. A negative result does not preclude SARS-Cov-2 infection and should not be used as the sole basis for treatment or other patient management decisions. A negative result may occur with  improper specimen collection/handling, submission of specimen other than nasopharyngeal swab, presence of viral mutation(s) within the areas targeted by this assay, and inadequate number of viral copies(<138 copies/mL). A negative result must be combined with clinical observations, patient history, and epidemiological information. The expected result is Negative.  Fact Sheet for Patients:  EntrepreneurPulse.com.au  Fact Sheet for Healthcare Providers:  IncredibleEmployment.be  This test is no t yet approved or cleared by the [place] FDA and  has been authorized for detection and/or diagnosis of SARS-CoV-2 by FDA under an Emergency Use Authorization (EUA). This EUA will remain  in effect (meaning this test can be used) for the duration of the COVID-19 declaration under Section 564(b)(1) of the Act, 21 U.S.C.section 360bbb-3(b)(1), unless the authorization is terminated  or revoked sooner.       Influenza A by PCR NEGATIVE NEGATIVE Final   Influenza B by PCR NEGATIVE NEGATIVE Final    Comment: (NOTE) The Xpert Xpress SARS-CoV-2/FLU/RSV plus assay is intended as an aid in the diagnosis of influenza from Nasopharyngeal swab specimens and should not be used as a sole basis for treatment. Nasal washings and aspirates are unacceptable for Xpert Xpress SARS-CoV-2/FLU/RSV testing.  Fact Sheet for Patients: EntrepreneurPulse.com.au  Fact Sheet for Healthcare Providers: IncredibleEmployment.be  This test is not yet approved or cleared by the [place] FDA and has been authorized for detection and/or diagnosis of SARS-CoV-2 by FDA under an Emergency Use  Authorization (EUA). This EUA will remain in effect (meaning this test can be used) for the duration of the COVID-19 declaration under Section 564(b)(1) of the Act, 21 U.S.C. section 360bbb-3(b)(1), unless the authorization is terminated or revoked.  Performed at [facility], [place] [address] [place], [place] [address]        Radiology Studies: ECHOCARDIOGRAM COMPLETE  Result Date: 10/29/2021    ECHOCARDIOGRAM REPORT   Patient Name:   [name] Date of Exam: 10/29/2021 Medical Rec #:  [id number]         Height:       68.0 in Accession #:    [id number]        Weight:       202.2 lb Date of Birth:  [birth year]        BSA:          2.053 m Patient Age:    36 years  BP:           178/87 mmHg Patient Gender: M                 HR:           61 bpm. Exam Location:  Inpatient Procedure: 2D Echo Indications:    acute systolic chf  History:        Patient has prior history of Echocardiogram examinations, most                 recent 08/13/2020. Chronic kidney disease, Arrythmias:Atrial                 Fibrillation; Risk Factors:Diabetes and Hypertension.  Sonographer:    Delcie Roch RDCS Referring Phys: [id number] JENNIFER YATES IMPRESSIONS  1. Left ventricular ejection fraction, by estimation, is 45 to 50%. The left ventricle has mildly decreased function. The left ventricle demonstrates regional wall motion abnormalities (see scoring diagram/findings for description). Left ventricular diastolic parameters are consistent with Grade II diastolic dysfunction (pseudonormalization). Elevated left atrial pressure. There is mild global left ventricular hypokinesis, slightly worse in the basal and mid segments of the inferior and inferolateral walls.  2. Right ventricular systolic function is moderately reduced. The right ventricular size is moderately enlarged. There is severely elevated pulmonary artery systolic pressure. The estimated right ventricular systolic pressure is 84.6 mmHg.  3. Left  atrial size was moderately dilated.  4. Right atrial size was moderately dilated.  5. The mitral valve is normal in structure. No evidence of mitral valve regurgitation.  6. Tricuspid valve regurgitation is mild to moderate.  7. The aortic valve is tricuspid. Aortic valve regurgitation is not visualized. No aortic stenosis is present.  8. The inferior vena cava is dilated in size with <50% respiratory variability, suggesting right atrial pressure of 15 mmHg. Comparison(s): Prior images reviewed side by side. The overall left ventricular function is worsened. The left ventricular diastolic function is unchanged. The left ventricular wall motion abnormalities are unchanged. The striking changes involve marked worsening of the pulmonary hypertension, tricuspid insufficiency and right ventricular dysfunction. FINDINGS  Left Ventricle: Left ventricular ejection fraction, by estimation, is 45 to 50%. The left ventricle has mildly decreased function. The left ventricle demonstrates regional wall motion abnormalities. The left ventricular internal cavity size was normal in size. There is no left ventricular hypertrophy. Left ventricular diastolic parameters are consistent with Grade II diastolic dysfunction (pseudonormalization). Elevated left atrial pressure.  LV Wall Scoring: The inferior wall and posterior wall are hypokinetic. There is mild global left ventricular hypokinesis, slightly worse in the basal and mid segments of the inferior and inferolateral walls. Right Ventricle: The right ventricular size is moderately enlarged. No increase in right ventricular wall thickness. Right ventricular systolic function is moderately reduced. There is severely elevated pulmonary artery systolic pressure. The tricuspid regurgitant velocity is 4.17 m/s, and with an assumed right atrial pressure of 15 mmHg, the estimated right ventricular systolic pressure is 84.6 mmHg. Left Atrium: Left atrial size was moderately dilated. Right  Atrium: Right atrial size was moderately dilated. Pericardium: There is no evidence of pericardial effusion. Mitral Valve: The mitral valve is normal in structure. No evidence of mitral valve regurgitation. Tricuspid Valve: The tricuspid valve is grossly normal. Tricuspid valve regurgitation is mild to moderate. Aortic Valve: The aortic valve is tricuspid. Aortic valve regurgitation is not visualized. No aortic stenosis is present. Pulmonic Valve: The pulmonic valve was normal in structure. Pulmonic valve regurgitation is not visualized. Aorta:  The aortic root and ascending aorta are structurally normal, with no evidence of dilitation. Venous: The inferior vena cava is dilated in size with less than 50% respiratory variability, suggesting right atrial pressure of 15 mmHg. IAS/Shunts: No atrial level shunt detected by color flow Doppler.  LEFT VENTRICLE PLAX 2D LVIDd:         5.40 cm      Diastology LVIDs:         3.40 cm      LV e' medial:    4.68 cm/s LV PW:         1.10 cm      LV E/e' medial:  18.8 LV IVS:        0.90 cm      LV e' lateral:   8.05 cm/s LVOT diam:     2.30 cm      LV E/e' lateral: 10.9 LV SV:         65 LV SV Index:   32 LVOT Area:     4.15 cm  LV Volumes (MOD) LV vol d, MOD A2C: 116.0 ml LV vol s, MOD A2C: 52.7 ml LV SV MOD A2C:     63.3 ml RIGHT VENTRICLE             IVC RV S prime:     12.00 cm/s  IVC diam: 2.50 cm TAPSE (M-mode): 1.7 cm LEFT ATRIUM             Index        RIGHT ATRIUM           Index LA diam:        4.80 cm 2.34 cm/m   RA Area:     21.50 cm LA Vol (A2C):   99.2 ml 48.31 ml/m  RA Volume:   57.30 ml  27.91 ml/m LA Vol (A4C):   81.8 ml 39.84 ml/m LA Biplane Vol: 90.6 ml 44.12 ml/m  AORTIC VALVE LVOT Vmax:   83.90 cm/s LVOT Vmean:  51.400 cm/s LVOT VTI:    0.156 m  AORTA Ao Root diam: 2.90 cm Ao Asc diam:  3.20 cm MITRAL VALVE               TRICUSPID VALVE MV Area (PHT): 3.60 cm    TR Peak grad:   69.6 mmHg MV Decel Time: 211 msec    TR Vmax:        417.00 cm/s MV E  velocity: 87.90 cm/s MV A velocity: 28.40 cm/s  SHUNTS MV E/A ratio:  3.10        Systemic VTI:  0.16 m                            Systemic Diam: 2.30 cm Mihai Croitoru MD Electronically signed by Sanda Klein MD Signature Date/Time: 10/29/2021/3:59:18 PM    Final     Scheduled Meds:  dapagliflozin propanediol  10 mg Oral Daily   docusate sodium  100 mg Oral BID   furosemide  80 mg Intravenous BID   insulin aspart  0-15 Units Subcutaneous TID WC   insulin aspart  0-5 Units Subcutaneous QHS   insulin glargine-yfgn  8 Units Subcutaneous Daily   metoprolol succinate  100 mg Oral BID   multivitamin with minerals  1 tablet Oral Daily   potassium chloride  40 mEq Oral BID   Ensure Max Protein  11 oz Oral BID   sacubitril-valsartan  1 tablet  Oral BID   sodium chloride flush  3 mL Intravenous Q12H   triamcinolone 0.1 % cream : eucerin   Topical BID   warfarin  7.5 mg Oral ONCE-1600   Warfarin - Pharmacist Dosing Inpatient   Does not apply q1600   Continuous Infusions:   LOS: 0 days   Time spent: 30 minutes   Darliss Cheney, MD [facility]  10/31/2021, 2:14 PM  Please page via [place] and do not message via secure chat for anything urgent. Secure chat can be used for anything non urgent.  How to contact the [facility] Attending or Consulting provider [place] or covering provider during after hours [place], for this patient?  Check the care team in [facility] and look for a) attending/consulting [facility] provider listed and b) the [facility] team listed. Page or secure chat [phone]. Log into [email or web address] and use 's universal password to access. If you do not have the password, please contact the hospital operator. Locate the [facility] provider you are looking for under [facility] and page to a number that you can be directly reached. If you still have difficulty reaching the provider, please page the [facility] (Director on Call) for the Hospitalists listed on amion for assistance.

## 2021-10-31 NOTE — Progress Notes (Signed)
Mobility Specialist Progress Note:   10/31/21 1439  Mobility  Activity Ambulated in room  Level of Assistance Modified independent, requires aide device or extra time  Assistive Device None  Distance Ambulated (ft) 40 ft  Mobility Ambulated with assistance in room  Mobility Response Tolerated well  Mobility performed by Mobility specialist  Bed Position Chair  $Mobility charge 1 Mobility   Pt received getting up from chair. No complaints of pain. Left in chair with call bell and phone in reach.   [facility] Designer, jewellery Phone [phone] Secondary Phone [phone]

## 2021-10-31 NOTE — Telephone Encounter (Signed)
Called and spoke to patient's daughter [name] to confirm/remind patient of [facility]. However she stated patient is currently in the hospital and will give [place] a call if she needs to reschedule.

## 2021-10-31 NOTE — Progress Notes (Addendum)
Patient expresses concern about farxiga and entresto and the efficacy of the drugs versus "Leisure centre manager. Patient is alert and oriented x 4 to RN and has express numerous concerns about the build of the border wall. RN provided education on the medications ordered by cardiology and patient expresses understanding of the usage.

## 2021-10-31 NOTE — Progress Notes (Addendum)
Advanced Heart Failure Rounding Note  PCP-Cardiologist: None   Subjective:   Admit weight 232-->226 pounds.   12/22 Diuresing with IV lasix . Negative 2.4 liters  Creatinine 1.45>1.6   Denies SOB.    Objective:   Weight Range: 102.6 kg Body mass index is 34.38 kg/m.   Vital Signs:   Temp:  [98 F (36.7 C)-98.6 F (37 C)] 98.2 F (36.8 C) (12/23 0730) Pulse Rate:  [54-67] 65 (12/23 0730) Resp:  [14-20] 20 (12/23 0453) BP: (131-176)/(65-88) 160/76 (12/23 0730) SpO2:  [83 %-92 %] 83 % (12/23 0730) Weight:  [102.6 kg] 102.6 kg (12/23 0453) Last BM Date: 10/28/21  Weight change: Filed Weights   10/30/21 0512 10/31/21 0453  Weight: 105.4 kg 102.6 kg    Intake/Output:   Intake/Output Summary (Last 24 hours) at 10/31/2021 0835 Last data filed at 10/31/2021 0820 Gross per 24 hour  Intake 840 ml  Output 3250 ml  Net -2410 ml      Physical Exam    General: Sitting in chair.  No resp difficulty HEENT: Normal Neck: Supple. JVP 11-12 . Carotids 2+ bilat; no bruits. No lymphadenopathy or thyromegaly appreciated. Cor: PMI nondisplaced. Regular rate & rhythm. No rubs, gallops or murmurs. Lungs: Clear Abdomen: Soft, nontender, nondistended. No hepatosplenomegaly. No bruits or masses. Good bowel sounds. Extremities: No cyanosis, clubbing, rash, R and LLE 2+ edema Neuro: Alert & orientedx3, cranial nerves grossly intact. moves all 4 extremities w/o difficulty. Affect pleasant   Telemetry   SB 50-60s  EKG   N/A  Labs    CBC Recent Labs    10/28/21 2112 10/30/21 0248  WBC 6.2 6.1  NEUTROABS 4.4  --   HGB 11.6* 10.6*  HCT 39.3 34.2*  MCV 94.9 91.4  PLT 166 135*   Basic Metabolic Panel Recent Labs    47/82/95 2112 10/30/21 0248 10/31/21 0250  NA 140 137 138  K 4.3 3.6 3.7  CL 106 104 100  CO2 28 25 32  GLUCOSE 206* 124* 130*  BUN 28* 22 22  CREATININE 1.50* 1.45* 1.60*  CALCIUM 9.0 8.4* 8.6*  MG 2.4  --  2.3   Liver Function Tests Recent  Labs    10/28/21 2112  AST 23  ALT 35  ALKPHOS 41  BILITOT 1.1  PROT 7.3  ALBUMIN 3.6   No results for input(s): LIPASE, AMYLASE in the last 72 hours. Cardiac Enzymes No results for input(s): CKTOTAL, CKMB, CKMBINDEX, TROPONINI in the last 72 hours.  BNP: BNP (last 3 results) Recent Labs    10/28/21 2112  BNP 1,165.7*    ProBNP (last 3 results) No results for input(s): PROBNP in the last 8760 hours.   D-Dimer No results for input(s): DDIMER in the last 72 hours. Hemoglobin A1C No results for input(s): HGBA1C in the last 72 hours. Fasting Lipid Panel No results for input(s): CHOL, HDL, LDLCALC, TRIG, CHOLHDL, LDLDIRECT in the last 72 hours. Thyroid Function Tests No results for input(s): TSH, T4TOTAL, T3FREE, THYROIDAB in the last 72 hours.  Invalid input(s): FREET3  Other results:   Imaging    No results found.   Medications:     Scheduled Medications:  docusate sodium  100 mg Oral BID   furosemide  80 mg Intravenous BID   insulin aspart  0-15 Units Subcutaneous TID WC   insulin aspart  0-5 Units Subcutaneous QHS   insulin glargine-yfgn  8 Units Subcutaneous Daily   metoprolol succinate  100 mg Oral BID  multivitamin with minerals  1 tablet Oral Daily   Ensure Max Protein  11 oz Oral BID   sodium chloride flush  3 mL Intravenous Q12H   triamcinolone 0.1 % cream : eucerin   Topical BID   Warfarin - Pharmacist Dosing Inpatient   Does not apply q1600    Infusions:   PRN Medications: acetaminophen **OR** acetaminophen, bisacodyl, hydrALAZINE, ondansetron **OR** ondansetron (ZOFRAN) IV, polyethylene glycol, traZODone    Patient Profile  Mr Mindel isa 70 year old with a history of  PAF, severe depression, LV thrombus, DM2, CKD stage III, obesity, OSA refused sleep study, and chronic systolic HF.  Admitted with volume overload.      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 I suspect due to medication errors. -On exam  he is warm + wet.  - Echo this admit EF 45-50%. Previously EF had normalized.  - Volume status improving. Continue lasix 80 mg twice a day .  - BP running high. Add entresto 24-26 mg twice a day.  - Add farxiga 10 mg daily. We will check on [employer]  - Renal function stable.  - Continue unna boots.    2. CKD Stage IIIa Creatinine on admit 1.5-->1.45 ->1.6  Follow BMET daily    2. DMII Hgb A1C 8.2. On SSI   3. HTN Stable.    4. PAF -In SR.  Continue metoprolol 100 mg twice a day  - on coumadin . INR 1.7    5. H/O LV Thrombus On coumadin . INR 1.7   Consult cardiac rehab.   Length of Stay: 0  Darrick Grinder, NP  10/31/2021, 8:35 AM  Advanced Heart Failure Team Pager [phone] (M-F; 7a - 5p)  Please contact [facility] for night-coverage after hours (5p -7a ) and weekends on [email or web address]  Patient seen and examined with the above-signed Advanced Practice Provider and/or Housestaff. I personally reviewed laboratory data, imaging studies and relevant notes. I independently examined the patient and formulated the important aspects of the plan. I have edited the note to reflect any of my changes or salient points. I have personally discussed the plan with the patient and/or family.  Continues to diurese on IV lasix. Denies orthopnea or PND. Mild DOE. No CP   General:  Sitting up  No resp difficulty HEENT: normal Neck: supple.JVP 10 Carotids 2+ bilat; no bruits. No lymphadenopathy or thryomegaly appreciated. Cor: PMI nondisplaced. Regular rate & rhythm. No rubs, gallops or murmurs. Lungs: clear Abdomen: obese soft, nontender, nondistended. No hepatosplenomegaly. No bruits or masses. Good bowel sounds. Extremities: no cyanosis, clubbing, rash, 1+ edema Neuro: alert & orientedx3, cranial nerves grossly intact. moves all 4 extremities w/o difficulty. Affect pleasant  Continues to diurese. SCr up slightly. Remains volume overloaded. Continue IV lasix. Watch renal function. Titrate GDMT  as above. Can consider RHC if needed.  Glori Bickers, MD  4:29 PM

## 2021-10-31 NOTE — Progress Notes (Signed)
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   Attempted heart failure education, patient rambles about the science behind the cellular level of his body from listening to a radio program.  His eyesight is poor and cannot see a scale, his daughter lives with him with her small children and is very busy.  Discussed finding a time his daughter can look at the scale several times a week and calling Dr. [facility] if her gains 3 pounds in a day or 5 pounds in a week.  Also discussed sodium restrictions.  Patient cannot cook for himself and eats packaged foods often.   1130-1300 Cindra Eves RN, BSN 10/31/2021 12:51 PM  60 A

## 2021-10-31 NOTE — Progress Notes (Signed)
ANTICOAGULATION CONSULT NOTE - Follow Up Consult  Pharmacy Consult for Warfarin Indication: atrial fibrillation  No Known Allergies  Patient Measurements: Weight: 102.6 kg (226 lb 1.6 oz)  Vital Signs: Temp: 98.2 F (36.8 C) (12/23 0730) Temp Source: Oral (12/23 0730) BP: 160/76 (12/23 0730) Pulse Rate: 65 (12/23 0730)  Labs: Recent Labs    10/28/21 2112 10/30/21 0248 10/31/21 0250  HGB 11.6* 10.6*  --   HCT 39.3 34.2*  --   PLT 166 135*  --   LABPROT 22.1* 22.8* 20.3*  INR 1.9* 2.0* 1.7*  CREATININE 1.50* 1.45* 1.60*     Estimated Creatinine Clearance: 49.2 mL/min (A) (by C-G formula based on SCr of 1.6 mg/dL (H)).   Assessment:  Anticoag: h/o Afib, pta on Coumadin - Hgb down 11.6>10.6. Plts down to 125. - INR also down from 2>1.7 this am. Will give boosted dose today. No bleeding issues noted.   - PTA warf dose: 3mg  qTues/Sat, 6mg  all other days  , Admit INR 1.9  Goal of Therapy:  INR 2-3 Monitor platelets by anticoagulation protocol: Yes   Plan:  Coumadin 7.5mg  po x 1 tonight Daily INR  PharmD., BCPS Clinical Pharmacist 10/31/2021 8:59 AM

## 2021-11-01 LAB — GLUCOSE, CAPILLARY
Glucose-Capillary: 121 mg/dL — ABNORMAL HIGH (ref 70–99)
Glucose-Capillary: 134 mg/dL — ABNORMAL HIGH (ref 70–99)
Glucose-Capillary: 155 mg/dL — ABNORMAL HIGH (ref 70–99)
Glucose-Capillary: 128 mg/dL — ABNORMAL HIGH (ref 70–99)

## 2021-11-01 LAB — BASIC METABOLIC PANEL
Anion gap: 9 (ref 5–15)
BUN: 21 mg/dL (ref 8–23)
CO2: 30 mmol/L (ref 22–32)
Calcium: 8.5 mg/dL — ABNORMAL LOW (ref 8.9–10.3)
Chloride: 97 mmol/L — ABNORMAL LOW (ref 98–111)
Creatinine, Ser: 1.68 mg/dL — ABNORMAL HIGH (ref 0.61–1.24)
GFR, Estimated: 43 mL/min — ABNORMAL LOW (ref >60)
Glucose, Bld: 123 mg/dL — ABNORMAL HIGH (ref 70–99)
Potassium: 3.4 mmol/L — ABNORMAL LOW (ref 3.5–5.1)
Sodium: 136 mmol/L (ref 135–145)

## 2021-11-01 LAB — PROTIME-INR
INR: 1.6 — ABNORMAL HIGH (ref 0.8–1.2)
Prothrombin Time: 19 seconds — ABNORMAL HIGH (ref 11.4–15.2)

## 2021-11-01 MED ORDER — POTASSIUM CHLORIDE CRYS ER 20 MEQ PO TBCR
40.0000 meq | EXTENDED_RELEASE_TABLET | Freq: Once | ORAL | Status: AC
  Administered 2021-11-01: 09:00:00 40 meq via ORAL
  Filled 2021-11-01: qty 2

## 2021-11-01 MED ORDER — WARFARIN SODIUM 5 MG PO TABS
5.0000 mg | ORAL_TABLET | Freq: Once | ORAL | Status: AC
  Administered 2021-11-01: 18:00:00 5 mg via ORAL
  Filled 2021-11-01: qty 1

## 2021-11-01 NOTE — Progress Notes (Signed)
ANTICOAGULATION CONSULT NOTE - Follow Up Consult  Pharmacy Consult for warfarin Indication: atrial fibrillation  No Known Allergies  Patient Measurements: Weight: 99 kg (218 lb 3.2 oz)  Vital Signs: Temp: 98.4 F (36.9 C) (12/24 0351) Temp Source: Oral (12/24 0351) BP: 134/60 (12/24 0822) Pulse Rate: 59 (12/24 0351)  Labs: Recent Labs    10/30/21 0248 10/31/21 0250 11/01/21 0440  HGB 10.6*  --   --   HCT 34.2*  --   --   PLT 135*  --   --   LABPROT 22.8* 20.3* 19.0*  INR 2.0* 1.7* 1.6*  CREATININE 1.45* 1.60* 1.68*    Estimated Creatinine Clearance: 46 mL/min (A) (by C-G formula based on SCr of 1.68 mg/dL (H)).   Assessment: 70 yo male admitted for acute on chronic combined systolic and diastolic CHF. He has a history of a fib and was on warfarin prior to admission.  PTA dose: 3mg  qTues/Sat, 6mg  all other days  , Admit INR 1.9  Most recent INR is 1.6 - below goal range.    Goal of Therapy:  INR 2-3 Monitor platelets by anticoagulation protocol: Yes   Plan:  Warfarin 5 mg x1 Daily INR Monitor for s/sx of bleeding   Thank you for including pharmacy in the care of this patient.  , PharmD PGY1 Acute Care Pharmacy Resident  Phone: [phone] 11/01/2021  8:29 AM  Please check [email or web address] for unit-specific pharmacy phone numbers.

## 2021-11-01 NOTE — Progress Notes (Signed)
PROGRESS NOTE    [name]  [id number] DOB: January 18, 1951 DOA: 10/28/2021 PCP: Sandrea Hughs, NP   Brief Narrative:  [name] is a 70 y.o. male with medical history significant of ADHD; afib on Coumadin; chronic systolic CHF; stage 3a CKD; DM; PVD s/p L 5th toe amputation; visual impairment; HTN; and OSA presented with edema, weight gain, and SOB and admitted under hospitalist service with acute on chronic combined CHF without hypoxia as well as had AKI.  He has gained 33 pounds in 1 month  Assessment & Plan:   Principal Problem:   Acute on chronic combined systolic and diastolic CHF (congestive heart failure) (HCC) Active Problems:   Type 2 diabetes mellitus with stage 3 chronic kidney disease, with long-term current use of insulin (HCC)   HTN (hypertension)   Acute-on-chronic kidney injury (HCC)   Atrial fibrillation (HCC)   Pressure injury of skin   Acute on chronic combined systolic (congestive) and diastolic (congestive) heart failure (HCC)  Acute on chronic combined CHF: Patient with known h/o chronic combined CHF presenting with worsening edema and weight gain of about 33 pounds in 1 month.  No hypoxia.  Chest x-ray negative for pulmonary edema.  Elevated BNP compared to previous.  Has significant bilateral lower extremity edema.  Repeat echo shows 45 to 50% LVEF and grade 2 diastolic dysfunction with left ventricular regional wall motion abnormality.  Another good day of diuresis, has had 5 L of urine output since yesterday and he is net -9 L now.  Weight down as well.  He is happy with that.  Edema is coming down as well.  Continue diuresis, management directed by heart failure team.  AKI: Likely cardiorenal.  Creatinine stable compared to yesterday.  Wondering if this is going to be his new baseline and now he has developed CKD.  Time will tell.  We will monitor a few more days.  Avoid nephrotoxic agents.  Essential hypertension: Elevated, continue home dose  of Toprol-XL and as needed hydralazine.  Wilder Glade and Entresto added today.  Type 2 diabetes mellitus: Recent hemoglobin A1c 8.2.  Blood sugar fluctuating, continue home dose of glargine and SSI.  Paroxysmal A. fib: Rate controlled, continue Toprol-XL and Coumadin dosed by pharmacy.  INR still slightly subtherapeutic.  Hypokalemia: 3.4.  Will replace.  Note: This patient has been tested and is negative for the novel coronavirus COVID-19. He has NOT been vaccinated against COVID-19.  DVT prophylaxis:    Code Status: Full Code  Family Communication:  None present at bedside.  Plan of care discussed with patient in length and he verbalized understanding and agreed with it.  Status is: Inpatient  Remains inpatient appropriate because: Needs IV diuresis.  Estimated body mass index is 33.18 kg/m as calculated from the following:   Height as of 07/28/21: 5\' 8"  (1.727 m).   Weight as of this encounter: 99 kg.  Pressure Injury 10/29/21 Buttocks Right;Left Stage 1 -  Intact skin with non-blanchable redness of a localized area usually over a bony prominence. Buttock pink and non blanchable (Active)  10/29/21 1210  Location: Buttocks  Location Orientation: Right;Left  Staging: Stage 1 -  Intact skin with non-blanchable redness of a localized area usually over a bony prominence.  Wound Description (Comments): Buttock pink and non blanchable  Present on Admission: Yes    Nutritional Assessment: Body mass index is 33.18 kg/m.Marland Kitchen Seen by dietician.  I agree with the assessment and plan as outlined below: Nutrition Status: Nutrition  Problem: Increased nutrient needs Etiology: acute illness (CHF exacerbation) Skin Assessment: I have examined the patient's skin and I agree with the wound assessment as performed by the wound care RN as outlined below: Pressure Injury 10/29/21 Buttocks Right;Left Stage 1 -  Intact skin with non-blanchable redness of a localized area usually over a bony prominence.  Buttock pink and non blanchable (Active)  10/29/21 1210  Location: Buttocks  Location Orientation: Right;Left  Staging: Stage 1 -  Intact skin with non-blanchable redness of a localized area usually over a bony prominence.  Wound Description (Comments): Buttock pink and non blanchable  Present on Admission: Yes    Consultants:  Cardiology/heart failure  Procedures:  None  Antimicrobials:  Anti-infectives (From admission, onward)    None          Subjective: Patient seen and examined.  He is happy that he is improving.  No shortness of breath.  Objective: Vitals:   10/31/21 2100 11/01/21 0351 11/01/21 0355 11/01/21 0822  BP: 129/74 137/64  134/60  Pulse: 61 (!) 59    Resp: 16 16    Temp: 98.6 F (37 C) 98.4 F (36.9 C)    TempSrc: Oral Oral    SpO2: 91% 92%    Weight:   99 kg     Intake/Output Summary (Last 24 hours) at 11/01/2021 1232 Last data filed at 11/01/2021 0357 Gross per 24 hour  Intake 615 ml  Output 4275 ml  Net -3660 ml    Filed Weights   10/30/21 0512 10/31/21 0453 11/01/21 0355  Weight: 105.4 kg 102.6 kg 99 kg    Examination:  General exam: Appears calm and comfortable, morbidly obese Respiratory system: Clear to auscultation. Respiratory effort normal. Cardiovascular system: S1 & S2 heard, RRR. No JVD, murmurs, rubs, gallops or clicks.  +2 pitting edema bilateral lower extremity Gastrointestinal system: Abdomen is nondistended, soft and nontender. No organomegaly or masses felt. Normal bowel sounds heard. Central nervous system: Alert and oriented. No focal neurological deficits. Extremities: Symmetric 5 x 5 power. Skin: No rashes, lesions or ulcers.  Psychiatry: Judgement and insight appear normal. Mood & affect appropriate.    Data Reviewed: I have personally reviewed following labs and imaging studies  CBC: Recent Labs  Lab 10/28/21 2112 10/30/21 0248  WBC 6.2 6.1  NEUTROABS 4.4  --   HGB 11.6* 10.6*  HCT 39.3 34.2*  MCV  94.9 91.4  PLT 166 135*    Basic Metabolic Panel: Recent Labs  Lab 10/28/21 2112 10/30/21 0248 10/31/21 0250 11/01/21 0440  NA 140 137 138 136  K 4.3 3.6 3.7 3.4*  CL 106 104 100 97*  CO2 28 25 32 30  GLUCOSE 206* 124* 130* 123*  BUN 28* 22 22 21   CREATININE 1.50* 1.45* 1.60* 1.68*  CALCIUM 9.0 8.4* 8.6* 8.5*  MG 2.4  --  2.3  --     GFR: Estimated Creatinine Clearance: 46 mL/min (A) (by C-G formula based on SCr of 1.68 mg/dL (H)). Liver Function Tests: Recent Labs  Lab 10/28/21 2112  AST 23  ALT 35  ALKPHOS 41  BILITOT 1.1  PROT 7.3  ALBUMIN 3.6    No results for input(s): LIPASE, AMYLASE in the last 168 hours. No results for input(s): AMMONIA in the last 168 hours. Coagulation Profile: Recent Labs  Lab 10/28/21 2112 10/30/21 0248 10/31/21 0250 11/01/21 0440  INR 1.9* 2.0* 1.7* 1.6*    Cardiac Enzymes: No results for input(s): CKTOTAL, CKMB, CKMBINDEX, TROPONINI in the last  168 hours. BNP (last 3 results) No results for input(s): PROBNP in the last 8760 hours. HbA1C: No results for input(s): HGBA1C in the last 72 hours. CBG: Recent Labs  Lab 10/31/21 1134 10/31/21 1559 10/31/21 2135 11/01/21 0457 11/01/21 1215  GLUCAP 163* 130* 154* 121* 134*    Lipid Profile: No results for input(s): CHOL, HDL, LDLCALC, TRIG, CHOLHDL, LDLDIRECT in the last 72 hours. Thyroid Function Tests: No results for input(s): TSH, T4TOTAL, FREET4, T3FREE, THYROIDAB in the last 72 hours. Anemia Panel: No results for input(s): VITAMINB12, FOLATE, FERRITIN, TIBC, IRON, RETICCTPCT in the last 72 hours. Sepsis Labs: No results for input(s): PROCALCITON, LATICACIDVEN in the last 168 hours.  Recent Results (from the past 240 hour(s))  Resp Panel by RT-PCR (Flu A&B, Covid) Nasopharyngeal Swab     Status: None   Collection Time: 10/29/21  8:26 AM   Specimen: Nasopharyngeal Swab; Nasopharyngeal(NP) swabs in vial transport medium  Result Value Ref Range Status   SARS  Coronavirus 2 by RT PCR NEGATIVE NEGATIVE Final    Comment: (NOTE) SARS-CoV-2 target nucleic acids are NOT DETECTED.  The SARS-CoV-2 RNA is generally detectable in upper respiratory specimens during the acute phase of infection. The lowest concentration of SARS-CoV-2 viral copies this assay can detect is 138 copies/mL. A negative result does not preclude SARS-Cov-2 infection and should not be used as the sole basis for treatment or other patient management decisions. A negative result may occur with  improper specimen collection/handling, submission of specimen other than nasopharyngeal swab, presence of viral mutation(s) within the areas targeted by this assay, and inadequate number of viral copies(<138 copies/mL). A negative result must be combined with clinical observations, patient history, and epidemiological information. The expected result is Negative.  Fact Sheet for Patients:  EntrepreneurPulse.com.au  Fact Sheet for Healthcare Providers:  IncredibleEmployment.be  This test is no t yet approved or cleared by the [place] FDA and  has been authorized for detection and/or diagnosis of SARS-CoV-2 by FDA under an Emergency Use Authorization (EUA). This EUA will remain  in effect (meaning this test can be used) for the duration of the COVID-19 declaration under Section 564(b)(1) of the Act, 21 U.S.C.section 360bbb-3(b)(1), unless the authorization is terminated  or revoked sooner.       Influenza A by PCR NEGATIVE NEGATIVE Final   Influenza B by PCR NEGATIVE NEGATIVE Final    Comment: (NOTE) The Xpert Xpress SARS-CoV-2/FLU/RSV plus assay is intended as an aid in the diagnosis of influenza from Nasopharyngeal swab specimens and should not be used as a sole basis for treatment. Nasal washings and aspirates are unacceptable for Xpert Xpress SARS-CoV-2/FLU/RSV testing.  Fact Sheet for  Patients: EntrepreneurPulse.com.au  Fact Sheet for Healthcare Providers: IncredibleEmployment.be  This test is not yet approved or cleared by the [place] FDA and has been authorized for detection and/or diagnosis of SARS-CoV-2 by FDA under an Emergency Use Authorization (EUA). This EUA will remain in effect (meaning this test can be used) for the duration of the COVID-19 declaration under Section 564(b)(1) of the Act, 21 U.S.C. section 360bbb-3(b)(1), unless the authorization is terminated or revoked.  Performed at [facility], [place] [address], [place], [place] [address]        Radiology Studies: No results found.  Scheduled Meds:  dapagliflozin propanediol  10 mg Oral Daily   docusate sodium  100 mg Oral BID   furosemide  80 mg Intravenous BID   insulin aspart  0-15 Units Subcutaneous TID WC  insulin aspart  0-5 Units Subcutaneous QHS   insulin glargine-yfgn  8 Units Subcutaneous Daily   metoprolol succinate  100 mg Oral BID   multivitamin with minerals  1 tablet Oral Daily   Ensure Max Protein  11 oz Oral BID   sacubitril-valsartan  1 tablet Oral BID   sodium chloride flush  3 mL Intravenous Q12H   triamcinolone 0.1 % cream : eucerin   Topical BID   warfarin  5 mg Oral ONCE-1600   Warfarin - Pharmacist Dosing Inpatient   Does not apply q1600   Continuous Infusions:   LOS: 1 day   Time spent: 28 minutes   Darliss Cheney, MD [facility]  11/01/2021, 12:32 PM  Please page via [place] and do not message via secure chat for anything urgent. Secure chat can be used for anything non urgent.  How to contact the [facility] Attending or Consulting provider [place] or covering provider during after hours [place], for this patient?  Check the care team in [facility] and look for a) attending/consulting [facility] provider listed and b) the [facility] team listed. Page or secure chat [phone]. Log into [email or web address] and use 's universal  password to access. If you do not have the password, please contact the hospital operator. Locate the [facility] provider you are looking for under [facility] and page to a number that you can be directly reached. If you still have difficulty reaching the provider, please page the [facility] (Director on Call) for the Hospitalists listed on amion for assistance.

## 2021-11-01 NOTE — Progress Notes (Signed)
Mobility Specialist Progress Note    11/01/21 1540  Mobility  Activity Ambulated in hall  Level of Assistance Standby assist, set-up cues, supervision of patient - no hands on  Assistive Device Cane  Distance Ambulated (ft) 400 ft  Mobility Ambulated with assistance in hallway  Mobility Response Tolerated well  Mobility performed by Mobility specialist  $Mobility charge 1 Mobility   Pt received standing in room voiding and agreeable. No complaints on walk. Maintained conversation throughout. Returned to sitting EOB with call bell in reach.   [facility] Mobility Specialist  M.S. Primary Phone: [phone] M.S. Secondary Phone: [phone]

## 2021-11-01 NOTE — Progress Notes (Signed)
Advanced Heart Failure Rounding Note  PCP-Cardiologist: None   Subjective:   Admit weight 232-->226 -> 218 pounds.   Continues to diurese with IV lasix. Breathing better. No CP, orthopnea or PND  Scr 1.60 -> 1.68   Objective:   Weight Range: 99 kg Body mass index is 33.18 kg/m.   Vital Signs:   Temp:  [98.3 F (36.8 C)-98.6 F (37 C)] 98.4 F (36.9 C) (12/24 0351) Pulse Rate:  [58-61] 59 (12/24 0351) Resp:  [16] 16 (12/24 0351) BP: (129-150)/(60-74) 134/60 (12/24 0822) SpO2:  [91 %-92 %] 92 % (12/24 0351) Weight:  [99 kg] 99 kg (12/24 0355) Last BM Date: 10/29/21  Weight change: Filed Weights   10/30/21 0512 10/31/21 0453 11/01/21 0355  Weight: 105.4 kg 102.6 kg 99 kg    Intake/Output:   Intake/Output Summary (Last 24 hours) at 11/01/2021 1150 Last data filed at 11/01/2021 0357 Gross per 24 hour  Intake 615 ml  Output 4575 ml  Net -3960 ml       Physical Exam   General:  Lying in bed No resp difficulty HEENT: normal Neck: supple. JVP up. Carotids 2+ bilat; no bruits. No lymphadenopathy or thryomegaly appreciated. Cor: PMI nondisplaced. Regular rate & rhythm. No rubs, gallops or murmurs. Lungs: clear Abdomen: obese soft, nontender, nondistended. No hepatosplenomegaly. No bruits or masses. Good bowel sounds. Extremities: no cyanosis, clubbing, rash, 1-2+ edema + UNNA Neuro: alert & orientedx3, cranial nerves grossly intact. moves all 4 extremities w/o difficulty. Affect pleasant  Telemetry   Sinus 50-60s Personally reviewed  Labs    CBC Recent Labs    10/30/21 0248  WBC 6.1  HGB 10.6*  HCT 34.2*  MCV 91.4  PLT 135*    Basic Metabolic Panel Recent Labs    54/00/86 0250 11/01/21 0440  NA 138 136  K 3.7 3.4*  CL 100 97*  CO2 32 30  GLUCOSE 130* 123*  BUN 22 21  CREATININE 1.60* 1.68*  CALCIUM 8.6* 8.5*  MG 2.3  --     Liver Function Tests No results for input(s): AST, ALT, ALKPHOS, BILITOT, PROT, ALBUMIN in the last 72  hours.  No results for input(s): LIPASE, AMYLASE in the last 72 hours. Cardiac Enzymes No results for input(s): CKTOTAL, CKMB, CKMBINDEX, TROPONINI in the last 72 hours.  BNP: BNP (last 3 results) Recent Labs    10/28/21 2112  BNP 1,165.7*     ProBNP (last 3 results) No results for input(s): PROBNP in the last 8760 hours.   D-Dimer No results for input(s): DDIMER in the last 72 hours. Hemoglobin A1C No results for input(s): HGBA1C in the last 72 hours. Fasting Lipid Panel No results for input(s): CHOL, HDL, LDLCALC, TRIG, CHOLHDL, LDLDIRECT in the last 72 hours. Thyroid Function Tests No results for input(s): TSH, T4TOTAL, T3FREE, THYROIDAB in the last 72 hours.  Invalid input(s): FREET3  Other results:   Imaging    No results found.   Medications:     Scheduled Medications:  dapagliflozin propanediol  10 mg Oral Daily   docusate sodium  100 mg Oral BID   furosemide  80 mg Intravenous BID   insulin aspart  0-15 Units Subcutaneous TID WC   insulin aspart  0-5 Units Subcutaneous QHS   insulin glargine-yfgn  8 Units Subcutaneous Daily   metoprolol succinate  100 mg Oral BID   multivitamin with minerals  1 tablet Oral Daily   Ensure Max Protein  11 oz Oral BID  sacubitril-valsartan  1 tablet Oral BID   sodium chloride flush  3 mL Intravenous Q12H   triamcinolone 0.1 % cream : eucerin   Topical BID   warfarin  5 mg Oral ONCE-1600   Warfarin - Pharmacist Dosing Inpatient   Does not apply q1600    Infusions:   PRN Medications: acetaminophen **OR** acetaminophen, bisacodyl, hydrALAZINE, ondansetron **OR** ondansetron (ZOFRAN) IV, polyethylene glycol, traZODone    Patient Profile  Mr Speicher isa 70 year old with a history of  PAF, severe depression, LV thrombus, DM2, CKD stage III, obesity, OSA refused sleep study, and chronic systolic HF.  Admitted with volume overload.   Assessment/Plan   1. Acute on chronic systolic HF - Admitted with marked  volume overload. - Myoview 12/2015. EF 20% with defects noted on rest and stress images within the inferior wall and to lesser extent the septum suggest ischemic etiology. No reversibility. Refused cath multiple times. No s/s angina currently. HsTrop not checked on admit. ECG with nonspecific inferolateral TW abnormalities - Echo 10/21 EF 55-60% - Echo this admit EF 45-50%.  - Volume status improving but still elevated. Continue lasix 80 mg twice a day .  - Continue Entresto 24/26 bid - Continue farxiga 10 mg daily. We will check on Co-Pay  - Renal function stable. Follow closely - Continue unna boots.    2. CKD Stage IIIa-IIIb - Creatinine on admit 1.5-->1.45 ->1.60 -> 1.68  - Follow BMET daily    3. DMII - Hgb A1C 8.2. On SSI  - Farxiga added  4. HTN - Improved with addition of Entresto   5. PAF -In SR.  - on coumadin . INR 1.6. PharmD dosing    6.  H/O LV Thrombus - resolved   Length of Stay: 1  Glori Bickers, MD  11/01/2021, 11:50 AM  Advanced Heart Failure Team Pager [phone] (M-F; 7a - 5p)  Please contact [facility] for night-coverage after hours (5p -7a ) and weekends on [email or web address]

## 2021-11-02 LAB — BASIC METABOLIC PANEL
Anion gap: 9 (ref 5–15)
BUN: 26 mg/dL — ABNORMAL HIGH (ref 8–23)
CO2: 30 mmol/L (ref 22–32)
Calcium: 8.5 mg/dL — ABNORMAL LOW (ref 8.9–10.3)
Chloride: 96 mmol/L — ABNORMAL LOW (ref 98–111)
Creatinine, Ser: 1.68 mg/dL — ABNORMAL HIGH (ref 0.61–1.24)
GFR, Estimated: 43 mL/min — ABNORMAL LOW (ref >60)
Glucose, Bld: 143 mg/dL — ABNORMAL HIGH (ref 70–99)
Potassium: 3.3 mmol/L — ABNORMAL LOW (ref 3.5–5.1)
Sodium: 135 mmol/L (ref 135–145)

## 2021-11-02 LAB — CBC
HCT: 38.5 % — ABNORMAL LOW (ref 39.0–52.0)
Hemoglobin: 11.9 g/dL — ABNORMAL LOW (ref 13.0–17.0)
MCH: 27.9 pg (ref 26.0–34.0)
MCHC: 30.9 g/dL (ref 30.0–36.0)
MCV: 90.2 fL (ref 80.0–100.0)
Platelets: 151 10*3/uL (ref 150–400)
RBC: 4.27 MIL/uL (ref 4.22–5.81)
RDW: 16.4 % — ABNORMAL HIGH (ref 11.5–15.5)
WBC: 6.3 10*3/uL (ref 4.0–10.5)
nRBC: 0 % (ref 0.0–0.2)

## 2021-11-02 LAB — GLUCOSE, CAPILLARY
Glucose-Capillary: 135 mg/dL — ABNORMAL HIGH (ref 70–99)
Glucose-Capillary: 171 mg/dL — ABNORMAL HIGH (ref 70–99)
Glucose-Capillary: 190 mg/dL — ABNORMAL HIGH (ref 70–99)
Glucose-Capillary: 166 mg/dL — ABNORMAL HIGH (ref 70–99)

## 2021-11-02 LAB — PROTIME-INR
INR: 1.6 — ABNORMAL HIGH (ref 0.8–1.2)
Prothrombin Time: 19.3 seconds — ABNORMAL HIGH (ref 11.4–15.2)

## 2021-11-02 MED ORDER — POTASSIUM CHLORIDE CRYS ER 20 MEQ PO TBCR
40.0000 meq | EXTENDED_RELEASE_TABLET | ORAL | Status: AC
  Administered 2021-11-02 (×2): 40 meq via ORAL
  Filled 2021-11-02 (×2): qty 2

## 2021-11-02 MED ORDER — WARFARIN SODIUM 3 MG PO TABS
6.0000 mg | ORAL_TABLET | Freq: Once | ORAL | Status: AC
  Administered 2021-11-02: 18:00:00 6 mg via ORAL
  Filled 2021-11-02: qty 2

## 2021-11-02 NOTE — Progress Notes (Signed)
Advanced Heart Failure Rounding Note  PCP-Cardiologist: None   Subjective:   Admit weight 232-->226 -> 218 -> 210 pounds. (Baseline weight ~ 200 pounds)  Continues to diurese briskly with IV lasix. Denies SOB, orthopnea or PND  Scr 1.60 -> 1.68 -> 1.68  K 3.3   Objective:   Weight Range: 95.4 kg Body mass index is 31.99 kg/m.   Vital Signs:   Temp:  [97.9 F (36.6 C)-98.8 F (37.1 C)] 98.3 F (36.8 C) (12/25 1137) Pulse Rate:  [57-60] 57 (12/25 1137) Resp:  [15-20] 15 (12/25 1137) BP: (130-135)/(63-67) 132/63 (12/25 1137) SpO2:  [93 %-94 %] 94 % (12/25 1137) Weight:  [95.4 kg] 95.4 kg (12/25 0502) Last BM Date: 10/29/21  Weight change: Filed Weights   10/31/21 0453 11/01/21 0355 11/02/21 0502  Weight: 102.6 kg 99 kg 95.4 kg    Intake/Output:   Intake/Output Summary (Last 24 hours) at 11/02/2021 1218 Last data filed at 11/02/2021 1100 Gross per 24 hour  Intake 803 ml  Output 2900 ml  Net -2097 ml       Physical Exam   General:  Well appearing. No resp difficulty HEENT: normal Neck: supple. no JVD. Carotids 2+ bilat; no bruits. No lymphadenopathy or thryomegaly appreciated. Cor: PMI nondisplaced. Regular rate & rhythm. No rubs, gallops or murmurs. Lungs: clear Abdomen: soft, nontender, nondistended. No hepatosplenomegaly. No bruits or masses. Good bowel sounds. Extremities: no cyanosis, clubbing, rash, 1+ edema Wearing UNNA Neuro: alert & orientedx3, cranial nerves grossly intact. moves all 4 extremities w/o difficulty. Affect pleasant   Telemetry   Sinus 50-60s Personally reviewed  Labs    CBC Recent Labs    11/02/21 0238  WBC 6.3  HGB 11.9*  HCT 38.5*  MCV 90.2  PLT 151    Basic Metabolic Panel Recent Labs    22/97/98 0250 11/01/21 0440 11/02/21 0238  NA 138 136 135  K 3.7 3.4* 3.3*  CL 100 97* 96*  CO2 32 30 30  GLUCOSE 130* 123* 143*  BUN 22 21 26*  CREATININE 1.60* 1.68* 1.68*  CALCIUM 8.6* 8.5* 8.5*  MG 2.3  --   --      Liver Function Tests No results for input(s): AST, ALT, ALKPHOS, BILITOT, PROT, ALBUMIN in the last 72 hours.  No results for input(s): LIPASE, AMYLASE in the last 72 hours. Cardiac Enzymes No results for input(s): CKTOTAL, CKMB, CKMBINDEX, TROPONINI in the last 72 hours.  BNP: BNP (last 3 results) Recent Labs    10/28/21 2112  BNP 1,165.7*     ProBNP (last 3 results) No results for input(s): PROBNP in the last 8760 hours.   D-Dimer No results for input(s): DDIMER in the last 72 hours. Hemoglobin A1C No results for input(s): HGBA1C in the last 72 hours. Fasting Lipid Panel No results for input(s): CHOL, HDL, LDLCALC, TRIG, CHOLHDL, LDLDIRECT in the last 72 hours. Thyroid Function Tests No results for input(s): TSH, T4TOTAL, T3FREE, THYROIDAB in the last 72 hours.  Invalid input(s): FREET3  Other results:   Imaging    No results found.   Medications:     Scheduled Medications:  dapagliflozin propanediol  10 mg Oral Daily   docusate sodium  100 mg Oral BID   furosemide  80 mg Intravenous BID   insulin aspart  0-15 Units Subcutaneous TID WC   insulin aspart  0-5 Units Subcutaneous QHS   insulin glargine-yfgn  8 Units Subcutaneous Daily   metoprolol succinate  100 mg Oral BID  multivitamin with minerals  1 tablet Oral Daily   potassium chloride  40 mEq Oral Q4H   Ensure Max Protein  11 oz Oral BID   sacubitril-valsartan  1 tablet Oral BID   sodium chloride flush  3 mL Intravenous Q12H   triamcinolone 0.1 % cream : eucerin   Topical BID   warfarin  6 mg Oral ONCE-1600   Warfarin - Pharmacist Dosing Inpatient   Does not apply q1600    Infusions:   PRN Medications: acetaminophen **OR** acetaminophen, bisacodyl, hydrALAZINE, ondansetron **OR** ondansetron (ZOFRAN) IV, polyethylene glycol, traZODone    Patient Profile  [name] isa 70 year old with a history of  PAF, severe depression, LV thrombus, DM2, CKD stage III, obesity, OSA refused sleep  study, and chronic systolic HF.  Admitted with volume overload.   Assessment/Plan   1. Acute on chronic systolic HF - Admitted with marked volume overload. - Myoview 12/2015. EF 20% with defects noted on rest and stress images within the inferior wall and to lesser extent the septum suggest ischemic etiology. No reversibility. Refused cath multiple times. No s/s angina currently. HsTrop not checked on admit. ECG with nonspecific inferolateral TW abnormalities - Echo 10/21 EF 55-60% - Echo this admit EF 45-50%.  - Weight down 22 pounds. Volume status improving but still elevated. Renal function stable. Continue lasix 80 mg twice a day. He is 210 pounds today. Previous dry weight 197-200 pounds - Continue Entresto 24/26 bid - Continue farxiga 10 mg daily. We will check on [employer]-Pay  - Continue unna boots.    2. CKD Stage IIIa-IIIb - Creatinine on admit 1.5-->1.45 ->1.60 -> 1.68 -> 1.68 - Follow BMET daily    3. DMII - Hgb A1C 8.2. On SSI  - Farxiga added  4. HTN - Improved with addition of Entresto   5. PAF - In SR.  - On warfarin INR unchanged at 1.6. PharmD dosing   6.  H/O LV Thrombus - resolved   Length of Stay: 2  Glori Bickers, MD  11/02/2021, 12:18 PM  Advanced Heart Failure Team Pager [phone] (M-F; 7a - 5p)  Please contact [facility] for night-coverage after hours (5p -7a ) and weekends on [email or web address]

## 2021-11-02 NOTE — Progress Notes (Addendum)
ANTICOAGULATION CONSULT NOTE - Follow Up Consult  Pharmacy Consult for warfarin Indication: atrial fibrillation  No Known Allergies  Patient Measurements: Weight: 95.4 kg (210 lb 6.4 oz)  Vital Signs: Temp: 98.8 F (37.1 C) (12/25 0502) Temp Source: Oral (12/25 0502) BP: 135/67 (12/25 0502) Pulse Rate: 60 (12/25 0502)  Labs: Recent Labs    10/31/21 0250 11/01/21 0440 11/02/21 0238  HGB  --   --  11.9*  HCT  --   --  38.5*  PLT  --   --  151  LABPROT 20.3* 19.0* 19.3*  INR 1.7* 1.6* 1.6*  CREATININE 1.60* 1.68* 1.68*     Estimated Creatinine Clearance: 45.2 mL/min (A) (by C-G formula based on SCr of 1.68 mg/dL (H)).   Assessment: 70 yo male admitted for acute on chronic combined systolic and diastolic CHF. He has a history of a fib and was on warfarin prior to admission.  PTA dose: 3mg  qTues/Sat, 6mg  all other days  , Admit INR 1.9  Most recent INR is 1.6 - below goal range. Will re-dose today and follow up plans for discharge. Would recommend resuming home regimen at this time since outpatient follow-up scheduled closely.  Goal of Therapy:  INR 2-3 Monitor platelets by anticoagulation protocol: Yes   Plan:  Warfarin 6 mg x1 Daily INR Monitor for s/sx of bleeding   Thank you for allowing pharmacy to participate in this patient's care.  , PharmD PGY1 Pharmacy Resident 11/02/2021 8:06 AM Check [email or web address] for unit specific pharmacy number

## 2021-11-02 NOTE — Progress Notes (Signed)
PROGRESS NOTE    [name]  [id number] DOB: 1951/01/12 DOA: 10/28/2021 PCP: Sandrea Hughs, NP   Brief Narrative:  [name] is a 70 y.o. male with medical history significant of ADHD; afib on Coumadin; chronic systolic CHF; stage 3a CKD; DM; PVD s/p L 5th toe amputation; visual impairment; HTN; and OSA presented with edema, weight gain, and SOB and admitted under hospitalist service with acute on chronic combined CHF without hypoxia as well as had AKI.  He has gained 33 pounds in 1 month  Assessment & Plan:   Principal Problem:   Acute on chronic combined systolic and diastolic CHF (congestive heart failure) (HCC) Active Problems:   Type 2 diabetes mellitus with stage 3 chronic kidney disease, with long-term current use of insulin (HCC)   HTN (hypertension)   Acute-on-chronic kidney injury (HCC)   Atrial fibrillation (HCC)   Pressure injury of skin   Acute on chronic combined systolic (congestive) and diastolic (congestive) heart failure (HCC)  Acute on chronic combined CHF: Patient with known h/o chronic combined CHF presenting with worsening edema and weight gain of about 33 pounds in 1 month.  No hypoxia.  Chest x-ray negative for pulmonary edema.  Elevated BNP compared to previous.  Has significant bilateral lower extremity edema.  Repeat echo shows 45 to 50% LVEF and grade 2 diastolic dysfunction with left ventricular regional wall motion abnormality.  Remains on Lasix 80 mg IV twice daily.  Another good day of diuresis.  Net negative about 10 L now.  Weight down 22 pounds.  AKI: Likely cardiorenal.  Creatinine stable compared to yesterday.  Most likely this is going to be his new baseline and now he has developed CKD since his creatinine has remained stable around 1.6 for the last 4 days now.  However we will continue to avoid nephrotoxic agents.  Essential hypertension: Controlled, continue home dose of Toprol-XL and as needed hydralazine.  Wilder Glade and  Entresto added today.  Type 2 diabetes mellitus: Recent hemoglobin A1c 8.2.  Blood sugar reasonably controlled, continue home dose of glargine and SSI.  Paroxysmal A. fib: Rate controlled, continue Toprol-XL and Coumadin dosed by pharmacy.  INR still slightly subtherapeutic.  Hypokalemia: Replace again.  Note: This patient has been tested and is negative for the novel coronavirus COVID-19. He has NOT been vaccinated against COVID-19.  DVT prophylaxis:    Code Status: Full Code  Family Communication:  None present at bedside.  Plan of care discussed with patient in length and he verbalized understanding and agreed with it.  Status is: Inpatient  Remains inpatient appropriate because: Needs IV diuresis.  Estimated body mass index is 31.99 kg/m as calculated from the following:   Height as of 07/28/21: 5\' 8"  (1.727 m).   Weight as of this encounter: 95.4 kg.  Pressure Injury 10/29/21 Buttocks Right;Left Stage 1 -  Intact skin with non-blanchable redness of a localized area usually over a bony prominence. Buttock pink and non blanchable (Active)  10/29/21 1210  Location: Buttocks  Location Orientation: Right;Left  Staging: Stage 1 -  Intact skin with non-blanchable redness of a localized area usually over a bony prominence.  Wound Description (Comments): Buttock pink and non blanchable  Present on Admission: Yes    Nutritional Assessment: Body mass index is 31.99 kg/m.Marland Kitchen Seen by dietician.  I agree with the assessment and plan as outlined below: Nutrition Status: Nutrition Problem: Increased nutrient needs Etiology: acute illness (CHF exacerbation) Skin Assessment: I have examined the patient's  skin and I agree with the wound assessment as performed by the wound care RN as outlined below: Pressure Injury 10/29/21 Buttocks Right;Left Stage 1 -  Intact skin with non-blanchable redness of a localized area usually over a bony prominence. Buttock pink and non blanchable (Active)   10/29/21 1210  Location: Buttocks  Location Orientation: Right;Left  Staging: Stage 1 -  Intact skin with non-blanchable redness of a localized area usually over a bony prominence.  Wound Description (Comments): Buttock pink and non blanchable  Present on Admission: Yes    Consultants:  Cardiology/heart failure  Procedures:  None  Antimicrobials:  Anti-infectives (From admission, onward)    None          Subjective:  Seen and examined.  No complaints.  Edema has gone down significantly.  Objective: Vitals:   11/01/21 2027 11/02/21 0502 11/02/21 0939 11/02/21 1137  BP: 133/63 135/67 130/65 132/63  Pulse: (!) 57 60 60 (!) 57  Resp: 18 20 18 15   Temp: 97.9 F (36.6 C) 98.8 F (37.1 C) 98.2 F (36.8 C) 98.3 F (36.8 C)  TempSrc: Oral Oral Oral Oral  SpO2: 93% 94% 94% 94%  Weight:  95.4 kg      Intake/Output Summary (Last 24 hours) at 11/02/2021 1414 Last data filed at 11/02/2021 1100 Gross per 24 hour  Intake 803 ml  Output 2900 ml  Net -2097 ml    Filed Weights   10/31/21 0453 11/01/21 0355 11/02/21 0502  Weight: 102.6 kg 99 kg 95.4 kg    Examination:  General exam: Appears calm and comfortable, morbidly obese Respiratory system: Clear to auscultation. Respiratory effort normal. Cardiovascular system: S1 & S2 heard, RRR. No JVD, murmurs, rubs, gallops or clicks.  +1-2 pitting edema bilateral lower extremity Gastrointestinal system: Abdomen is nondistended, soft and nontender. No organomegaly or masses felt. Normal bowel sounds heard. Central nervous system: Alert and oriented. No focal neurological deficits. Extremities: Symmetric 5 x 5 power. Skin: No rashes, lesions or ulcers.  Psychiatry: Judgement and insight appear normal. Mood & affect appropriate.    Data Reviewed: I have personally reviewed following labs and imaging studies  CBC: Recent Labs  Lab 10/28/21 2112 10/30/21 0248 11/02/21 0238  WBC 6.2 6.1 6.3  NEUTROABS 4.4  --   --    HGB 11.6* 10.6* 11.9*  HCT 39.3 34.2* 38.5*  MCV 94.9 91.4 90.2  PLT 166 135* 151    Basic Metabolic Panel: Recent Labs  Lab 10/28/21 2112 10/30/21 0248 10/31/21 0250 11/01/21 0440 11/02/21 0238  NA 140 137 138 136 135  K 4.3 3.6 3.7 3.4* 3.3*  CL 106 104 100 97* 96*  CO2 28 25 32 30 30  GLUCOSE 206* 124* 130* 123* 143*  BUN 28* 22 22 21  26*  CREATININE 1.50* 1.45* 1.60* 1.68* 1.68*  CALCIUM 9.0 8.4* 8.6* 8.5* 8.5*  MG 2.4  --  2.3  --   --     GFR: Estimated Creatinine Clearance: 45.2 mL/min (A) (by C-G formula based on SCr of 1.68 mg/dL (H)). Liver Function Tests: Recent Labs  Lab 10/28/21 2112  AST 23  ALT 35  ALKPHOS 41  BILITOT 1.1  PROT 7.3  ALBUMIN 3.6    No results for input(s): LIPASE, AMYLASE in the last 168 hours. No results for input(s): AMMONIA in the last 168 hours. Coagulation Profile: Recent Labs  Lab 10/28/21 2112 10/30/21 0248 10/31/21 0250 11/01/21 0440 11/02/21 0238  INR 1.9* 2.0* 1.7* 1.6* 1.6*  Cardiac Enzymes: No results for input(s): CKTOTAL, CKMB, CKMBINDEX, TROPONINI in the last 168 hours. BNP (last 3 results) No results for input(s): PROBNP in the last 8760 hours. HbA1C: No results for input(s): HGBA1C in the last 72 hours. CBG: Recent Labs  Lab 11/01/21 1215 11/01/21 1654 11/01/21 2100 11/02/21 0636 11/02/21 1134  GLUCAP 134* 155* 128* 135* 171*    Lipid Profile: No results for input(s): CHOL, HDL, LDLCALC, TRIG, CHOLHDL, LDLDIRECT in the last 72 hours. Thyroid Function Tests: No results for input(s): TSH, T4TOTAL, FREET4, T3FREE, THYROIDAB in the last 72 hours. Anemia Panel: No results for input(s): VITAMINB12, FOLATE, FERRITIN, TIBC, IRON, RETICCTPCT in the last 72 hours. Sepsis Labs: No results for input(s): PROCALCITON, LATICACIDVEN in the last 168 hours.  Recent Results (from the past 240 hour(s))  Resp Panel by RT-PCR (Flu A&B, Covid) Nasopharyngeal Swab     Status: None   Collection Time: 10/29/21   8:26 AM   Specimen: Nasopharyngeal Swab; Nasopharyngeal(NP) swabs in vial transport medium  Result Value Ref Range Status   SARS Coronavirus 2 by RT PCR NEGATIVE NEGATIVE Final    Comment: (NOTE) SARS-CoV-2 target nucleic acids are NOT DETECTED.  The SARS-CoV-2 RNA is generally detectable in upper respiratory specimens during the acute phase of infection. The lowest concentration of SARS-CoV-2 viral copies this assay can detect is 138 copies/mL. A negative result does not preclude SARS-Cov-2 infection and should not be used as the sole basis for treatment or other patient management decisions. A negative result may occur with  improper specimen collection/handling, submission of specimen other than nasopharyngeal swab, presence of viral mutation(s) within the areas targeted by this assay, and inadequate number of viral copies(<138 copies/mL). A negative result must be combined with clinical observations, patient history, and epidemiological information. The expected result is Negative.  Fact Sheet for Patients:  EntrepreneurPulse.com.au  Fact Sheet for Healthcare Providers:  IncredibleEmployment.be  This test is no t yet approved or cleared by the [place] FDA and  has been authorized for detection and/or diagnosis of SARS-CoV-2 by FDA under an Emergency Use Authorization (EUA). This EUA will remain  in effect (meaning this test can be used) for the duration of the COVID-19 declaration under Section 564(b)(1) of the Act, 21 U.S.C.section 360bbb-3(b)(1), unless the authorization is terminated  or revoked sooner.       Influenza A by PCR NEGATIVE NEGATIVE Final   Influenza B by PCR NEGATIVE NEGATIVE Final    Comment: (NOTE) The Xpert Xpress SARS-CoV-2/FLU/RSV plus assay is intended as an aid in the diagnosis of influenza from Nasopharyngeal swab specimens and should not be used as a sole basis for treatment. Nasal washings and aspirates  are unacceptable for Xpert Xpress SARS-CoV-2/FLU/RSV testing.  Fact Sheet for Patients: EntrepreneurPulse.com.au  Fact Sheet for Healthcare Providers: IncredibleEmployment.be  This test is not yet approved or cleared by the [place] FDA and has been authorized for detection and/or diagnosis of SARS-CoV-2 by FDA under an Emergency Use Authorization (EUA). This EUA will remain in effect (meaning this test can be used) for the duration of the COVID-19 declaration under Section 564(b)(1) of the Act, 21 U.S.C. section 360bbb-3(b)(1), unless the authorization is terminated or revoked.  Performed at [facility], [place] [address] [place], [place] [address]        Radiology Studies: No results found.  Scheduled Meds:  dapagliflozin propanediol  10 mg Oral Daily   docusate sodium  100 mg Oral BID   furosemide  80  mg Intravenous BID   insulin aspart  0-15 Units Subcutaneous TID WC   insulin aspart  0-5 Units Subcutaneous QHS   insulin glargine-yfgn  8 Units Subcutaneous Daily   metoprolol succinate  100 mg Oral BID   multivitamin with minerals  1 tablet Oral Daily   potassium chloride  40 mEq Oral Q4H   Ensure Max Protein  11 oz Oral BID   sacubitril-valsartan  1 tablet Oral BID   sodium chloride flush  3 mL Intravenous Q12H   triamcinolone 0.1 % cream : eucerin   Topical BID   warfarin  6 mg Oral ONCE-1600   Warfarin - Pharmacist Dosing Inpatient   Does not apply q1600   Continuous Infusions:   LOS: 2 days   Time spent: 27 minutes   Darliss Cheney, MD [facility]  11/02/2021, 2:14 PM  Please page via [place] and do not message via secure chat for anything urgent. Secure chat can be used for anything non urgent.  How to contact the [facility] Attending or Consulting provider [place] or covering provider during after hours [place], for this patient?  Check the care team in [facility] and look for a) attending/consulting [facility] provider  listed and b) the [facility] team listed. Page or secure chat [phone]. Log into [email or web address] and use [place]'s universal password to access. If you do not have the password, please contact the hospital operator. Locate the [facility] provider you are looking for under [facility] and page to a number that you can be directly reached. If you still have difficulty reaching the provider, please page the [facility] (Director on Call) for the Hospitalists listed on amion for assistance.

## 2021-11-02 NOTE — Progress Notes (Signed)
Mobility Specialist Progress Note    11/02/21 1409  Mobility  Activity Refused mobility   Pt did not give a reason but stated he was hot.   [facility] Mobility Specialist  M.S. Primary Phone: [phone] M.S. Secondary Phone: [phone]

## 2021-11-03 ENCOUNTER — Other Ambulatory Visit: Payer: Self-pay

## 2021-11-03 LAB — GLUCOSE, CAPILLARY
Glucose-Capillary: 153 mg/dL — ABNORMAL HIGH (ref 70–99)
Glucose-Capillary: 178 mg/dL — ABNORMAL HIGH (ref 70–99)
Glucose-Capillary: 133 mg/dL — ABNORMAL HIGH (ref 70–99)
Glucose-Capillary: 183 mg/dL — ABNORMAL HIGH (ref 70–99)

## 2021-11-03 LAB — BASIC METABOLIC PANEL
Anion gap: 12 (ref 5–15)
BUN: 33 mg/dL — ABNORMAL HIGH (ref 8–23)
CO2: 29 mmol/L (ref 22–32)
Calcium: 8.8 mg/dL — ABNORMAL LOW (ref 8.9–10.3)
Chloride: 98 mmol/L (ref 98–111)
Creatinine, Ser: 1.6 mg/dL — ABNORMAL HIGH (ref 0.61–1.24)
GFR, Estimated: 46 mL/min — ABNORMAL LOW (ref >60)
Glucose, Bld: 144 mg/dL — ABNORMAL HIGH (ref 70–99)
Potassium: 3.9 mmol/L (ref 3.5–5.1)
Sodium: 139 mmol/L (ref 135–145)

## 2021-11-03 LAB — PROTIME-INR
INR: 1.6 — ABNORMAL HIGH (ref 0.8–1.2)
Prothrombin Time: 18.6 seconds — ABNORMAL HIGH (ref 11.4–15.2)

## 2021-11-03 MED ORDER — WARFARIN SODIUM 7.5 MG PO TABS
7.5000 mg | ORAL_TABLET | Freq: Once | ORAL | Status: AC
  Administered 2021-11-03: 17:00:00 7.5 mg via ORAL
  Filled 2021-11-03: qty 1

## 2021-11-03 NOTE — Progress Notes (Signed)
ANTICOAGULATION CONSULT NOTE - Follow Up Consult  Pharmacy Consult for warfarin Indication: atrial fibrillation  No Known Allergies  Patient Measurements: Weight: 92.3 kg (203 lb 6.4 oz)  Vital Signs: Temp: 97.8 F (36.6 C) (12/26 0509) Temp Source: Oral (12/26 0509) BP: 150/74 (12/26 0509) Pulse Rate: 60 (12/26 0509)  Labs: Recent Labs    11/01/21 0440 11/02/21 0238 11/03/21 0737  HGB  --  11.9*  --   HCT  --  38.5*  --   PLT  --  151  --   LABPROT 19.0* 19.3* 18.6*  INR 1.6* 1.6* 1.6*  CREATININE 1.68* 1.68* 1.60*     Estimated Creatinine Clearance: 46.7 mL/min (A) (by C-G formula based on SCr of 1.6 mg/dL (H)).   Assessment: 70 yo male admitted for acute on chronic combined systolic and diastolic CHF. He has a history of a fib and was on warfarin prior to admission.  PTA dose: 3mg  qTues/Sat, 6mg  all other days  , Admit INR 1.9  INR still 1.6 - below goal range. Will re-dose today and follow up plans for discharge. Would recommend resuming home regimen at this time since outpatient follow-up scheduled closely.  Goal of Therapy:  INR 2-3 Monitor platelets by anticoagulation protocol: Yes   Plan:  Warfarin 7.5 mg x1 Daily INR Monitor for s/sx of bleeding   Thank you for allowing pharmacy to participate in this patient's care.  PharmD., BCPS Clinical Pharmacist 11/03/2021 9:42 AM

## 2021-11-03 NOTE — TOC Progression Note (Signed)
Transition of Care [facility]) - Progression Note    Patient Details  Name: [name] MRN: [id number] Date of Birth: 1950-11-18  [facility] of [facility]) CM/SW Contact  Leone Haven, RN Phone Number: 11/03/2021, 4:10 PM  Clinical Narrative:     Transition of Care [facility]) Screening Note   Patient Details  Name: [name] Date of Birth: September 26, 1951   Transition of Care [facility]) CM/SW Contact:    Leone Haven, RN Phone Number: 11/03/2021, 4:10 PM    Transition of Care Department [facility]) has reviewed patient and no TOC needs have been identified at this time. We will continue to monitor patient advancement through interdisciplinary progression rounds. If new patient transition needs arise, please place a TOC consult.          Expected Discharge Plan and Services                                                 Social Determinants of Health (SDOH) Interventions    Readmission Risk Interventions No flowsheet data found.

## 2021-11-03 NOTE — Progress Notes (Signed)
°  Transition of Care (TOC) Screening Note   Patient Details  Name: [name] Date of Birth: 11/29/1950   Transition of Care [facility]) CM/SW Contact:    Luther Newhouse, LCSW Phone Number: 11/03/2021, 10:33 AM    Transition of Care Department [facility]) has reviewed patient and no TOC needs have been identified at this time. We will continue to monitor patient advancement through interdisciplinary progression rounds. If new patient transition needs arise, please place a TOC consult.

## 2021-11-03 NOTE — Plan of Care (Signed)
°  Problem: Activity: Goal: Capacity to carry out activities will improve Outcome: Progressing   Problem: Activity: Goal: Capacity to carry out activities will improve Outcome: Progressing   Problem: Cardiac: Goal: Ability to achieve and maintain adequate cardiopulmonary perfusion will improve Outcome: Progressing   Problem: Clinical Measurements: Goal: Will remain free from infection Outcome: Progressing Goal: Respiratory complications will improve Outcome: Progressing

## 2021-11-03 NOTE — Progress Notes (Signed)
Mobility Specialist: Progress Note   11/03/21 1806  Mobility  Activity Ambulated in hall  Level of Assistance Modified independent, requires aide device or extra time  Assistive Device Front wheel walker  Distance Ambulated (ft) 470 ft  Mobility Ambulated with assistance in hallway  Mobility Response Tolerated well  Mobility performed by Mobility specialist  $Mobility charge 1 Mobility    Pt had no c/o throughout ambulation. Pt back to bed after walk with call bell in his lap.   [facility] Zoella Roberti Mobility Specialist Mobility Specialist 4 [place]: [phone] Mobility Specialist 2 [place] and [address] [place]: [phone]

## 2021-11-03 NOTE — Progress Notes (Signed)
PROGRESS NOTE    [name]  VFI:[id number] DOB: 1951/05/18 DOA: 10/28/2021 PCP: Caesar Bookman, NP   Brief Narrative:  [name] is a 70 y.o. male with medical history significant of ADHD; afib on Coumadin; chronic systolic CHF; stage 3a CKD; DM; PVD s/p L 5th toe amputation; visual impairment; HTN; and OSA presented with edema, weight gain, and SOB and admitted under hospitalist service with acute on chronic combined CHF without hypoxia as well as had AKI.  He had gained 33 pounds in 1 month.  Cardiology consulted, patient was started on IV diuresis, has diuresed significantly since admission.  Weight significantly down as well.  Assessment & Plan:   Principal Problem:   Acute on chronic combined systolic and diastolic CHF (congestive heart failure) (HCC) Active Problems:   Type 2 diabetes mellitus with stage 3 chronic kidney disease, with long-term current use of insulin (HCC)   HTN (hypertension)   Acute-on-chronic kidney injury (HCC)   Atrial fibrillation (HCC)   Pressure injury of skin   Acute on chronic combined systolic (congestive) and diastolic (congestive) heart failure (HCC)  Acute on chronic combined CHF: Patient with known h/o chronic combined CHF presenting with worsening edema and weight gain of about 33 pounds in 1 month.  Weight upon admission was 105.3 kg.  No hypoxia.  Chest x-ray negative for pulmonary edema.  Elevated BNP compared to previous.  Repeat echo shows 45 to 50% LVEF and grade 2 diastolic dysfunction with left ventricular regional wall motion abnormality.  Remains on Lasix 80 mg IV twice daily.  Cardiology managing.  Another good day of diuresis.  Net negative 14 L since admission and weight down to 92 kg.   CKD stage IIIa: Patient presented with creatinine of 1.5 which has remained stable around that area, I believe now he has CKD stage IIIa.  Essential hypertension: Controlled, continue home dose of Toprol-XL and as needed hydralazine.   Marcelline Deist and Entresto added today.  Type 2 diabetes mellitus: Recent hemoglobin A1c 8.2.  Blood sugar reasonably controlled, continue home dose of glargine and SSI.  Paroxysmal A. fib: Rate controlled, continue Toprol-XL and Coumadin dosed by pharmacy.  INR still slightly subtherapeutic.  Hypokalemia: Resolved.  Note: This patient has been tested and is negative for the novel coronavirus COVID-19. He has NOT been vaccinated against COVID-19.  DVT prophylaxis:    Code Status: Full Code  Family Communication:  None present at bedside.  Plan of care discussed with patient in length and he verbalized understanding and agreed with it.  Status is: Inpatient  Remains inpatient appropriate because: Needs IV diuresis.  Estimated body mass index is 30.93 kg/m as calculated from the following:   Height as of 07/28/21: 5\' 8"  (1.727 m).   Weight as of this encounter: 92.3 kg.  Pressure Injury 10/29/21 Buttocks Right;Left Stage 1 -  Intact skin with non-blanchable redness of a localized area usually over a bony prominence. Buttock pink and non blanchable (Active)  10/29/21 1210  Location: Buttocks  Location Orientation: Right;Left  Staging: Stage 1 -  Intact skin with non-blanchable redness of a localized area usually over a bony prominence.  Wound Description (Comments): Buttock pink and non blanchable  Present on Admission: Yes    Nutritional Assessment: Body mass index is 30.93 kg/m.10/31/21 Seen by dietician.  I agree with the assessment and plan as outlined below: Nutrition Status: Nutrition Problem: Increased nutrient needs Etiology: acute illness (CHF exacerbation) Skin Assessment: I have examined the patient's skin  and I agree with the wound assessment as performed by the wound care RN as outlined below: Pressure Injury 10/29/21 Buttocks Right;Left Stage 1 -  Intact skin with non-blanchable redness of a localized area usually over a bony prominence. Buttock pink and non blanchable (Active)   10/29/21 1210  Location: Buttocks  Location Orientation: Right;Left  Staging: Stage 1 -  Intact skin with non-blanchable redness of a localized area usually over a bony prominence.  Wound Description (Comments): Buttock pink and non blanchable  Present on Admission: Yes    Consultants:  Cardiology/heart failure  Procedures:  None  Antimicrobials:  Anti-infectives (From admission, onward)    None          Subjective:  Seen and examined.  No complaints.  Objective: Vitals:   11/02/21 1137 11/02/21 1702 11/02/21 2115 11/03/21 0509  BP: 132/63 130/70  (!) 150/74  Pulse: (!) 57 61 68 60  Resp: 15 17 17 17   Temp: 98.3 F (36.8 C) 98.1 F (36.7 C) 99.2 F (37.3 C) 97.8 F (36.6 C)  TempSrc: Oral Oral Oral Oral  SpO2: 94% 95% 91% 92%  Weight:    92.3 kg    Intake/Output Summary (Last 24 hours) at 11/03/2021 1243 Last data filed at 11/03/2021 0813 Gross per 24 hour  Intake 600 ml  Output 4200 ml  Net -3600 ml    Filed Weights   11/01/21 0355 11/02/21 0502 11/03/21 0509  Weight: 99 kg 95.4 kg 92.3 kg    Examination:  General exam: Appears calm and comfortable, morbidly obese Respiratory system: Clear to auscultation. Respiratory effort normal. Cardiovascular system: S1 & S2 heard, RRR. No JVD, murmurs, rubs, gallops or clicks.  +1 pitting edema bilateral lower extremity Gastrointestinal system: Abdomen is nondistended, soft and nontender. No organomegaly or masses felt. Normal bowel sounds heard. Central nervous system: Alert and oriented. No focal neurological deficits. Extremities: Symmetric 5 x 5 power. Skin: No rashes, lesions or ulcers.  Psychiatry: Judgement and insight appear normal. Mood & affect appropriate.    Data Reviewed: I have personally reviewed following labs and imaging studies  CBC: Recent Labs  Lab 10/28/21 2112 10/30/21 0248 11/02/21 0238  WBC 6.2 6.1 6.3  NEUTROABS 4.4  --   --   HGB 11.6* 10.6* 11.9*  HCT 39.3 34.2* 38.5*   MCV 94.9 91.4 90.2  PLT 166 135* [id number]    Basic Metabolic Panel: Recent Labs  Lab 10/28/21 2112 10/30/21 0248 10/31/21 0250 11/01/21 0440 11/02/21 0238 11/03/21 0737  NA 140 137 138 136 135 139  K 4.3 3.6 3.7 3.4* 3.3* 3.9  CL 106 104 100 97* 96* 98  CO2 28 25 32 30 30 29   GLUCOSE 206* 124* 130* 123* 143* 144*  BUN 28* 22 22 21  26* 33*  CREATININE 1.50* 1.45* 1.60* 1.68* 1.68* 1.60*  CALCIUM 9.0 8.4* 8.6* 8.5* 8.5* 8.8*  MG 2.4  --  2.3  --   --   --     GFR: Estimated Creatinine Clearance: 46.7 mL/min (A) (by C-G formula based on SCr of 1.6 mg/dL (H)). Liver Function Tests: Recent Labs  Lab 10/28/21 2112  AST 23  ALT 35  ALKPHOS 41  BILITOT 1.1  PROT 7.3  ALBUMIN 3.6    No results for input(s): LIPASE, AMYLASE in the last 168 hours. No results for input(s): AMMONIA in the last 168 hours. Coagulation Profile: Recent Labs  Lab 10/30/21 0248 10/31/21 0250 11/01/21 0440 11/02/21 0238 11/03/21 0737  INR 2.0* 1.7*  1.6* 1.6* 1.6*    Cardiac Enzymes: No results for input(s): CKTOTAL, CKMB, CKMBINDEX, TROPONINI in the last 168 hours. BNP (last 3 results) No results for input(s): PROBNP in the last 8760 hours. HbA1C: No results for input(s): HGBA1C in the last 72 hours. CBG: Recent Labs  Lab 11/02/21 1134 11/02/21 1705 11/02/21 2116 11/03/21 0615 11/03/21 1158  GLUCAP 171* 190* 166* 153* 178*    Lipid Profile: No results for input(s): CHOL, HDL, LDLCALC, TRIG, CHOLHDL, LDLDIRECT in the last 72 hours. Thyroid Function Tests: No results for input(s): TSH, T4TOTAL, FREET4, T3FREE, THYROIDAB in the last 72 hours. Anemia Panel: No results for input(s): VITAMINB12, FOLATE, FERRITIN, TIBC, IRON, RETICCTPCT in the last 72 hours. Sepsis Labs: No results for input(s): PROCALCITON, LATICACIDVEN in the last 168 hours.  Recent Results (from the past 240 hour(s))  Resp Panel by RT-PCR (Flu A&B, Covid) Nasopharyngeal Swab     Status: None   Collection Time:  10/29/21  8:26 AM   Specimen: Nasopharyngeal Swab; Nasopharyngeal(NP) swabs in vial transport medium  Result Value Ref Range Status   SARS Coronavirus 2 by RT PCR NEGATIVE NEGATIVE Final    Comment: (NOTE) SARS-CoV-2 target nucleic acids are NOT DETECTED.  The SARS-CoV-2 RNA is generally detectable in upper respiratory specimens during the acute phase of infection. The lowest concentration of SARS-CoV-2 viral copies this assay can detect is 138 copies/mL. A negative result does not preclude SARS-Cov-2 infection and should not be used as the sole basis for treatment or other patient management decisions. A negative result may occur with  improper specimen collection/handling, submission of specimen other than nasopharyngeal swab, presence of viral mutation(s) within the areas targeted by this assay, and inadequate number of viral copies(<138 copies/mL). A negative result must be combined with clinical observations, patient history, and epidemiological information. The expected result is Negative.  Fact Sheet for Patients:  EntrepreneurPulse.com.au  Fact Sheet for Healthcare Providers:  IncredibleEmployment.be  This test is no t yet approved or cleared by the [place] FDA and  has been authorized for detection and/or diagnosis of SARS-CoV-2 by FDA under an Emergency Use Authorization (EUA). This EUA will remain  in effect (meaning this test can be used) for the duration of the COVID-19 declaration under Section 564(b)(1) of the Act, 21 U.S.C.section 360bbb-3(b)(1), unless the authorization is terminated  or revoked sooner.       Influenza A by PCR NEGATIVE NEGATIVE Final   Influenza B by PCR NEGATIVE NEGATIVE Final    Comment: (NOTE) The Xpert Xpress SARS-CoV-2/FLU/RSV plus assay is intended as an aid in the diagnosis of influenza from Nasopharyngeal swab specimens and should not be used as a sole basis for treatment. Nasal washings  and aspirates are unacceptable for Xpert Xpress SARS-CoV-2/FLU/RSV testing.  Fact Sheet for Patients: EntrepreneurPulse.com.au  Fact Sheet for Healthcare Providers: IncredibleEmployment.be  This test is not yet approved or cleared by the [place] FDA and has been authorized for detection and/or diagnosis of SARS-CoV-2 by FDA under an Emergency Use Authorization (EUA). This EUA will remain in effect (meaning this test can be used) for the duration of the COVID-19 declaration under Section 564(b)(1) of the Act, 21 U.S.C. section 360bbb-3(b)(1), unless the authorization is terminated or revoked.  Performed at [facility], [place] [address] [place], [place] [address]        Radiology Studies: No results found.  Scheduled Meds:  dapagliflozin propanediol  10 mg Oral Daily   docusate sodium  100 mg Oral  BID   furosemide  80 mg Intravenous BID   insulin aspart  0-15 Units Subcutaneous TID WC   insulin aspart  0-5 Units Subcutaneous QHS   insulin glargine-yfgn  8 Units Subcutaneous Daily   metoprolol succinate  100 mg Oral BID   multivitamin with minerals  1 tablet Oral Daily   Ensure Max Protein  11 oz Oral BID   sacubitril-valsartan  1 tablet Oral BID   sodium chloride flush  3 mL Intravenous Q12H   triamcinolone 0.1 % cream : eucerin   Topical BID   warfarin  7.5 mg Oral ONCE-1600   Warfarin - Pharmacist Dosing Inpatient   Does not apply q1600   Continuous Infusions:   LOS: 3 days   Time spent: 26 minutes   Darliss Cheney, MD [facility]  11/03/2021, 12:43 PM  Please page via Shea [place] and do not message via secure chat for anything urgent. Secure chat can be used for anything non urgent.  How to contact the [facility] Attending or Consulting provider [place] or covering provider during after hours [place], for this patient?  Check the care team in [facility] and look for a) attending/consulting TRH provider listed and b) the [facility]  team listed. Page or secure chat [phone]. Log into [email or web address] and use [place]'s universal password to access. If you do not have the password, please contact the hospital operator. Locate the [facility] provider you are looking for under [facility] and page to a number that you can be directly reached. If you still have difficulty reaching the provider, please page the [facility] (Director on Call) for the Hospitalists listed on amion for assistance.

## 2021-11-03 NOTE — Progress Notes (Signed)
Heart Failure Rounding Note  PCP-Cardiologist: None   Subjective:   Weight continues to decrease, making excellent urine. Renal function stable to slightly improved.  Has started to take Unna boots off. We discussed ambulation today. Orthopnea has resolved.  Denies chest pain. No syncope or palpitations.    Objective:   Weight Range: 92.3 kg Body mass index is 30.93 kg/m.   Vital Signs:   Temp:  [97.8 F (36.6 C)-99.2 F (37.3 C)] 97.8 F (36.6 C) (12/26 0509) Pulse Rate:  [57-68] 60 (12/26 0509) Resp:  [15-17] 17 (12/26 0509) BP: (130-150)/(63-74) 150/74 (12/26 0509) SpO2:  [91 %-95 %] 92 % (12/26 0509) Weight:  [92.3 kg] 92.3 kg (12/26 0509) Last BM Date: 10/29/21  Weight change: Filed Weights   11/01/21 0355 11/02/21 0502 11/03/21 0509  Weight: 99 kg 95.4 kg 92.3 kg    Intake/Output:   Intake/Output Summary (Last 24 hours) at 11/03/2021 1108 Last data filed at 11/03/2021 0813 Gross per 24 hour  Intake 840 ml  Output 4200 ml  Net -3360 ml      Physical Exam   GEN: Well nourished, well developed in no acute distress NECK: No JVD sitting upright CARDIAC: regular rhythm, normal S1 and S2, no rubs or gallops. No murmur. VASCULAR: Radial pulses 2+ bilaterally.  RESPIRATORY:  Clear to auscultation, faint crackles L base ABDOMEN: Soft, non-tender, non-distended MUSCULOSKELETAL:  Moves all 4 limbs independently SKIN: Warm and dry, bilateral trivial LE edema, Unna boots in place NEUROLOGIC:  No focal neuro deficits noted. PSYCHIATRIC:  Normal affect     Telemetry   SR, occasional artifact-- Personally reviewed  Labs    CBC Recent Labs    11/02/21 0238  WBC 6.3  HGB 11.9*  HCT 38.5*  MCV 90.2  PLT 123XX123   Basic Metabolic Panel Recent Labs    11/02/21 0238 11/03/21 0737  NA 135 139  K 3.3* 3.9  CL 96* 98  CO2 30 29  GLUCOSE 143* 144*  BUN 26* 33*  CREATININE 1.68* 1.60*  CALCIUM 8.5* 8.8*   Liver Function Tests No results for  input(s): AST, ALT, ALKPHOS, BILITOT, PROT, ALBUMIN in the last 72 hours.  No results for input(s): LIPASE, AMYLASE in the last 72 hours. Cardiac Enzymes No results for input(s): CKTOTAL, CKMB, CKMBINDEX, TROPONINI in the last 72 hours.  BNP: BNP (last 3 results) Recent Labs    10/28/21 2112  BNP 1,165.7*    ProBNP (last 3 results) No results for input(s): PROBNP in the last 8760 hours.   D-Dimer No results for input(s): DDIMER in the last 72 hours. Hemoglobin A1C No results for input(s): HGBA1C in the last 72 hours. Fasting Lipid Panel No results for input(s): CHOL, HDL, LDLCALC, TRIG, CHOLHDL, LDLDIRECT in the last 72 hours. Thyroid Function Tests No results for input(s): TSH, T4TOTAL, T3FREE, THYROIDAB in the last 72 hours.  Invalid input(s): FREET3  Other results:   Imaging    No results found.   Medications:     Scheduled Medications:  dapagliflozin propanediol  10 mg Oral Daily   docusate sodium  100 mg Oral BID   furosemide  80 mg Intravenous BID   insulin aspart  0-15 Units Subcutaneous TID WC   insulin aspart  0-5 Units Subcutaneous QHS   insulin glargine-yfgn  8 Units Subcutaneous Daily   metoprolol succinate  100 mg Oral BID   multivitamin with minerals  1 tablet Oral Daily   Ensure Max Protein  11 oz Oral BID  sacubitril-valsartan  1 tablet Oral BID   sodium chloride flush  3 mL Intravenous Q12H   triamcinolone 0.1 % cream : eucerin   Topical BID   warfarin  7.5 mg Oral ONCE-1600   Warfarin - Pharmacist Dosing Inpatient   Does not apply q1600    Infusions:   PRN Medications: acetaminophen **OR** acetaminophen, bisacodyl, hydrALAZINE, ondansetron **OR** ondansetron (ZOFRAN) IV, polyethylene glycol, traZODone    Patient Profile  [name] isa 70 year old with a history of  PAF, severe depression, LV thrombus, DM2, CKD stage III, obesity, OSA refused sleep study, and chronic systolic HF.  Admitted with volume  overload.   Assessment/Plan   1. Acute on chronic systolic HF - Admitted with marked volume overload. - Myoview 12/2015. EF 20% with defects noted on rest and stress images within the inferior wall and to lesser extent the septum suggest ischemic etiology. No reversibility. Refused cath multiple times. No s/s angina currently. HsTrop not checked on admit. ECG with nonspecific inferolateral TW abnormalities - Echo 10/21 EF 55-60% - Echo this admit EF 45-50%.  - He is diuresing well on IV lasix. Weight down significantly but not yet at his baseline. Still with trivial LE edema and faint crackles at L base. Continue diuresis today - Continue Entresto 24/26 bid - Continue farxiga 10 mg daily - Continue unna boots if tolerated   2. CKD Stage IIIa-IIIb - Creatinine on admit 1.5-->1.45 ->1.60 -> 1.68 -> 1.68 ->1.60 - Follow BMET daily    3. DMII - Hgb A1C 8.2. On SSI  - Farxiga added  4. HTN - Improved with addition of Entresto   5. PAF - In SR.  - On warfarin INR unchanged at 1.6. PharmD dosing   6.  H/O LV Thrombus - resolved   Length of Stay: 3  Jodelle Red, MD  11/03/2021, 11:08 AM  Advanced Heart Failure Team Pager [phone] (M-F; 7a - 5p)  Please contact [facility] for night-coverage after hours (5p -7a ) and weekends on [email or web address]

## 2021-11-04 ENCOUNTER — Encounter (HOSPITAL_COMMUNITY): Payer: Medicare HMO

## 2021-11-04 ENCOUNTER — Other Ambulatory Visit (HOSPITAL_COMMUNITY): Payer: Self-pay

## 2021-11-04 LAB — PROTIME-INR
INR: 1.3 — ABNORMAL HIGH (ref 0.8–1.2)
Prothrombin Time: 16.2 seconds — ABNORMAL HIGH (ref 11.4–15.2)

## 2021-11-04 LAB — BASIC METABOLIC PANEL
Anion gap: 11 (ref 5–15)
BUN: 44 mg/dL — ABNORMAL HIGH (ref 8–23)
CO2: 29 mmol/L (ref 22–32)
Calcium: 8.8 mg/dL — ABNORMAL LOW (ref 8.9–10.3)
Chloride: 98 mmol/L (ref 98–111)
Creatinine, Ser: 1.6 mg/dL — ABNORMAL HIGH (ref 0.61–1.24)
GFR, Estimated: 46 mL/min — ABNORMAL LOW (ref >60)
Glucose, Bld: 173 mg/dL — ABNORMAL HIGH (ref 70–99)
Potassium: 3.4 mmol/L — ABNORMAL LOW (ref 3.5–5.1)
Sodium: 138 mmol/L (ref 135–145)

## 2021-11-04 LAB — GLUCOSE, CAPILLARY
Glucose-Capillary: 209 mg/dL — ABNORMAL HIGH (ref 70–99)
Glucose-Capillary: 223 mg/dL — ABNORMAL HIGH (ref 70–99)
Glucose-Capillary: 174 mg/dL — ABNORMAL HIGH (ref 70–99)
Glucose-Capillary: 173 mg/dL — ABNORMAL HIGH (ref 70–99)

## 2021-11-04 LAB — HEPARIN LEVEL (UNFRACTIONATED): Heparin Unfractionated: <0.1 IU/mL — ABNORMAL LOW (ref 0.30–0.70)

## 2021-11-04 MED ORDER — EMPAGLIFLOZIN 10 MG PO TABS
10.0000 mg | ORAL_TABLET | Freq: Every day | ORAL | Status: DC
  Administered 2021-11-05 – 2021-11-10 (×6): 10 mg via ORAL
  Filled 2021-11-04 (×7): qty 1

## 2021-11-04 MED ORDER — POTASSIUM CHLORIDE CRYS ER 20 MEQ PO TBCR
40.0000 meq | EXTENDED_RELEASE_TABLET | ORAL | Status: AC
  Administered 2021-11-04 (×2): 40 meq via ORAL
  Filled 2021-11-04 (×2): qty 2

## 2021-11-04 MED ORDER — INSULIN GLARGINE-YFGN 100 UNIT/ML ~~LOC~~ SOLN
15.0000 [IU] | Freq: Every day | SUBCUTANEOUS | Status: DC
  Administered 2021-11-04 – 2021-11-10 (×7): 15 [IU] via SUBCUTANEOUS
  Filled 2021-11-04 (×7): qty 0.15

## 2021-11-04 MED ORDER — WARFARIN SODIUM 5 MG PO TABS
5.0000 mg | ORAL_TABLET | Freq: Once | ORAL | Status: AC
  Administered 2021-11-04: 17:00:00 5 mg via ORAL
  Filled 2021-11-04: qty 1

## 2021-11-04 MED ORDER — FUROSEMIDE 40 MG PO TABS
40.0000 mg | ORAL_TABLET | Freq: Every day | ORAL | Status: DC
  Filled 2021-11-04: qty 1

## 2021-11-04 MED ORDER — AMIODARONE HCL IN DEXTROSE 360-4.14 MG/200ML-% IV SOLN
60.0000 mg/h | INTRAVENOUS | Status: AC
  Administered 2021-11-04: 14:00:00 60 mg/h via INTRAVENOUS
  Filled 2021-11-04: qty 200

## 2021-11-04 MED ORDER — AMIODARONE HCL IN DEXTROSE 360-4.14 MG/200ML-% IV SOLN
30.0000 mg/h | INTRAVENOUS | Status: DC
  Administered 2021-11-04 – 2021-11-05 (×3): 30 mg/h via INTRAVENOUS
  Filled 2021-11-04 (×4): qty 200

## 2021-11-04 MED ORDER — AMIODARONE LOAD VIA INFUSION
150.0000 mg | Freq: Once | INTRAVENOUS | Status: AC
  Administered 2021-11-04: 14:00:00 150 mg via INTRAVENOUS
  Filled 2021-11-04: qty 83.34

## 2021-11-04 MED ORDER — HEPARIN (PORCINE) 25000 UT/250ML-% IV SOLN
1400.0000 [IU]/h | INTRAVENOUS | Status: DC
  Administered 2021-11-04 – 2021-11-05 (×2): 1250 [IU]/h via INTRAVENOUS
  Filled 2021-11-04 (×2): qty 250

## 2021-11-04 NOTE — Progress Notes (Signed)
PROGRESS NOTE    [name]  [id number] DOB: [birth year] DOA: 10/28/2021 PCP: Sandrea Hughs, NP   Brief Narrative:  [name] is a 70 y.o. male with medical history significant of ADHD; afib on Coumadin; chronic systolic CHF; stage 3a CKD; DM; PVD s/p L 5th toe amputation; visual impairment; HTN; and OSA presented with edema, weight gain, and SOB and admitted under hospitalist service with acute on chronic combined CHF without hypoxia as well as had AKI.  He had gained 33 pounds in 1 month.  Cardiology consulted, patient was started on IV diuresis, has diuresed significantly since admission.  Weight significantly down as well.  Assessment & Plan:   Principal Problem:   Acute on chronic combined systolic and diastolic CHF (congestive heart failure) (HCC) Active Problems:   Type 2 diabetes mellitus with stage 3 chronic kidney disease, with long-term current use of insulin (HCC)   HTN (hypertension)   Acute-on-chronic kidney injury (HCC)   Atrial fibrillation (HCC)   Pressure injury of skin   Acute on chronic combined systolic (congestive) and diastolic (congestive) heart failure (HCC)  Acute on chronic combined CHF: Patient with known h/o chronic combined CHF presenting with worsening edema and weight gain of about 33 pounds in 1 month.  Weight upon admission was 105.3 kg.  No hypoxia.  Chest x-ray negative for pulmonary edema.  Elevated BNP compared to previous.  Repeat echo shows 45 to 50% LVEF and grade 2 diastolic dysfunction with left ventricular regional wall motion abnormality..  Cardiology managing.  Had another good day of diuresis.  Net negative 16 L since admission and weight also down 27 pounds.  Due to orthostatics, Lasix was discontinued and he is going to be started on oral Lasix starting tomorrow.  CKD stage IIIa: Patient presented with creatinine of 1.5 which has remained stable around that area, I believe now he has CKD stage IIIa.  Essential  hypertension: Controlled, continue home dose of Toprol-XL and as needed hydralazine.  Entresto started yesterday.  [place] transition to [place] due to low co-pay.  Type 2 diabetes mellitus: Recent hemoglobin A1c 8.2.  Blood sugar elevated, will increase Lantus from 8 units to 15 units and continue SSI.  Paroxysmal A. fib: He is back in atrial fibrillation.  INR also subtherapeutic.  Cardiology has started him on heparin bridging.  They prefer for him to be back in normal sinus rhythm before discharge, if not achieved biochemically, will consider TEE/DCCV  Hypokalemia: Low again, will replace.  Note: This patient has been tested and is negative for the novel coronavirus COVID-19. He has NOT been vaccinated against COVID-19.  DVT prophylaxis:    Code Status: Full Code  Family Communication:  None present at bedside.  Plan of care discussed with patient in length and he verbalized understanding and agreed with it.  Status is: Inpatient  Remains inpatient appropriate because: Needs IV diuresis.  Estimated body mass index is 31.17 kg/m as calculated from the following:   Height as of this encounter: 5\' 8"  (1.727 m).   Weight as of this encounter: 93 kg.  Pressure Injury 10/29/21 Buttocks Right;Left Stage 1 -  Intact skin with non-blanchable redness of a localized area usually over a bony prominence. Buttock pink and non blanchable (Active)  10/29/21 1210  Location: Buttocks  Location Orientation: Right;Left  Staging: Stage 1 -  Intact skin with non-blanchable redness of a localized area usually over a bony prominence.  Wound Description (Comments): Buttock pink and non blanchable  Present on Admission: Yes    Nutritional Assessment: Body mass index is 31.17 kg/m.Marland Kitchen Seen by dietician.  I agree with the assessment and plan as outlined below: Nutrition Status: Nutrition Problem: Increased nutrient needs Etiology: acute illness (CHF exacerbation) Skin Assessment: I have examined the  patient's skin and I agree with the wound assessment as performed by the wound care RN as outlined below: Pressure Injury 10/29/21 Buttocks Right;Left Stage 1 -  Intact skin with non-blanchable redness of a localized area usually over a bony prominence. Buttock pink and non blanchable (Active)  10/29/21 1210  Location: Buttocks  Location Orientation: Right;Left  Staging: Stage 1 -  Intact skin with non-blanchable redness of a localized area usually over a bony prominence.  Wound Description (Comments): Buttock pink and non blanchable  Present on Admission: Yes    Consultants:  Cardiology/heart failure  Procedures:  None  Antimicrobials:  Anti-infectives (From admission, onward)    None          Subjective:  Seen and examined.  No complaints.  No shortness of breath.  Did not have Unna boot on.  Does not want to wear them anymore.  Objective: Vitals:   11/03/21 0509 11/03/21 1640 11/03/21 2012 11/04/21 0510  BP: (!) 150/74 (!) 150/74 120/73 (!) 160/116  Pulse: 60 60 100 (!) 102  Resp: 17 17 18 20   Temp: 97.8 F (36.6 C) 97.8 F (36.6 C) 97.6 F (36.4 C) 98.1 F (36.7 C)  TempSrc: Oral  Oral Oral  SpO2: 92%  90%   Weight: 92.3 kg 92.3 kg  93 kg  Height:  5\' 8"  (1.727 m)      Intake/Output Summary (Last 24 hours) at 11/04/2021 Last data filed at 11/04/2021 0400 Gross per 24 hour  Intake 480 ml  Output 2175 ml  Net -1695 ml    Filed Weights   11/03/21 0509 11/03/21 1640 11/04/21 0510  Weight: 92.3 kg 92.3 kg 93 kg    Examination:  General exam: Appears calm and comfortable  Respiratory system: Clear to auscultation. Respiratory effort normal. Cardiovascular system: S1 & S2 heard, irregularly irregular rate and rhythm. No JVD, murmurs, rubs, gallops or clicks.  Trace pitting edema bilateral lower extremity. Gastrointestinal system: Abdomen is nondistended, soft and nontender. No organomegaly or masses felt. Normal bowel sounds heard. Central nervous  system: Alert and oriented. No focal neurological deficits. Extremities: Symmetric 5 x 5 power. Skin: No rashes, lesions or ulcers.  Psychiatry: Judgement and insight appear normal. Mood & affect appropriate.  Data Reviewed: I have personally reviewed following labs and imaging studies  CBC: Recent Labs  Lab 10/28/21 2112 10/30/21 0248 11/02/21 0238  WBC 6.2 6.1 6.3  NEUTROABS 4.4  --   --   HGB 11.6* 10.6* 11.9*  HCT 39.3 34.2* 38.5*  MCV 94.9 91.4 90.2  PLT 166 135* 151    Basic Metabolic Panel: Recent Labs  Lab 10/28/21 2112 10/30/21 0248 10/31/21 0250 11/01/21 0440 11/02/21 0238 11/03/21 0737 11/04/21 0429  NA 140   < > 138 136 135 139 138  K 4.3   < > 3.7 3.4* 3.3* 3.9 3.4*  CL 106   < > 100 97* 96* 98 98  CO2 28   < > 32 30 30 29 29   GLUCOSE 206*   < > 130* 123* 143* 144* 173*  BUN 28*   < > 22 21 26* 33* 44*  CREATININE 1.50*   < > 1.60* 1.68* 1.68* 1.60* 1.60*  CALCIUM  9.0   < > 8.6* 8.5* 8.5* 8.8* 8.8*  MG 2.4  --  2.3  --   --   --   --    < > = values in this interval not displayed.    GFR: Estimated Creatinine Clearance: 46.8 mL/min (A) (by C-G formula based on SCr of 1.6 mg/dL (H)). Liver Function Tests: Recent Labs  Lab 10/28/21 2112  AST 23  ALT 35  ALKPHOS 41  BILITOT 1.1  PROT 7.3  ALBUMIN 3.6    No results for input(s): LIPASE, AMYLASE in the last 168 hours. No results for input(s): AMMONIA in the last 168 hours. Coagulation Profile: Recent Labs  Lab 10/31/21 0250 11/01/21 0440 11/02/21 0238 11/03/21 0737 11/04/21 0429  INR 1.7* 1.6* 1.6* 1.6* 1.3*    Cardiac Enzymes: No results for input(s): CKTOTAL, CKMB, CKMBINDEX, TROPONINI in the last 168 hours. BNP (last 3 results) No results for input(s): PROBNP in the last 8760 hours. HbA1C: No results for input(s): HGBA1C in the last 72 hours. CBG: Recent Labs  Lab 11/03/21 0615 11/03/21 1158 11/03/21 1706 11/03/21 2140 11/04/21 0611  GLUCAP 153* 178* 133* 183* 209*     Lipid Profile: No results for input(s): CHOL, HDL, LDLCALC, TRIG, CHOLHDL, LDLDIRECT in the last 72 hours. Thyroid Function Tests: No results for input(s): TSH, T4TOTAL, FREET4, T3FREE, THYROIDAB in the last 72 hours. Anemia Panel: No results for input(s): VITAMINB12, FOLATE, FERRITIN, TIBC, IRON, RETICCTPCT in the last 72 hours. Sepsis Labs: No results for input(s): PROCALCITON, LATICACIDVEN in the last 168 hours.  Recent Results (from the past 240 hour(s))  Resp Panel by RT-PCR (Flu A&B, Covid) Nasopharyngeal Swab     Status: None   Collection Time: 10/29/21  8:26 AM   Specimen: Nasopharyngeal Swab; Nasopharyngeal(NP) swabs in vial transport medium  Result Value Ref Range Status   SARS Coronavirus 2 by RT PCR NEGATIVE NEGATIVE Final    Comment: (NOTE) SARS-CoV-2 target nucleic acids are NOT DETECTED.  The SARS-CoV-2 RNA is generally detectable in upper respiratory specimens during the acute phase of infection. The lowest concentration of SARS-CoV-2 viral copies this assay can detect is 138 copies/mL. A negative result does not preclude SARS-Cov-2 infection and should not be used as the sole basis for treatment or other patient management decisions. A negative result may occur with  improper specimen collection/handling, submission of specimen other than nasopharyngeal swab, presence of viral mutation(s) within the areas targeted by this assay, and inadequate number of viral copies(<138 copies/mL). A negative result must be combined with clinical observations, patient history, and epidemiological information. The expected result is Negative.  Fact Sheet for Patients:  BloggerCourse.com  Fact Sheet for Healthcare Providers:  SeriousBroker.it  This test is no t yet approved or cleared by the [place] FDA and  has been authorized for detection and/or diagnosis of SARS-CoV-2 by FDA under an Emergency Use Authorization  (EUA). This EUA will remain  in effect (meaning this test can be used) for the duration of the COVID-19 declaration under Section 564(b)(1) of the Act, 21 U.S.C.section 360bbb-3(b)(1), unless the authorization is terminated  or revoked sooner.       Influenza A by PCR NEGATIVE NEGATIVE Final   Influenza B by PCR NEGATIVE NEGATIVE Final    Comment: (NOTE) The Xpert Xpress SARS-CoV-2/FLU/RSV plus assay is intended as an aid in the diagnosis of influenza from Nasopharyngeal swab specimens and should not be used as a sole basis for treatment. Nasal washings and aspirates are unacceptable  for Xpert Xpress SARS-CoV-2/FLU/RSV testing.  Fact Sheet for Patients: EntrepreneurPulse.com.au  Fact Sheet for Healthcare Providers: IncredibleEmployment.be  This test is not yet approved or cleared by the [place] FDA and has been authorized for detection and/or diagnosis of SARS-CoV-2 by FDA under an Emergency Use Authorization (EUA). This EUA will remain in effect (meaning this test can be used) for the duration of the COVID-19 declaration under Section 564(b)(1) of the Act, 21 U.S.C. section 360bbb-3(b)(1), unless the authorization is terminated or revoked.  Performed at [facility], [place] [address] [place], [place] [address]        Radiology Studies: No results found.  Scheduled Meds:  dapagliflozin propanediol  10 mg Oral Daily   docusate sodium  100 mg Oral BID   furosemide  80 mg Intravenous BID   insulin aspart  0-15 Units Subcutaneous TID WC   insulin aspart  0-5 Units Subcutaneous QHS   insulin glargine-yfgn  15 Units Subcutaneous Daily   metoprolol succinate  100 mg Oral BID   multivitamin with minerals  1 tablet Oral Daily   potassium chloride  40 mEq Oral Q4H   Ensure Max Protein  11 oz Oral BID   sacubitril-valsartan  1 tablet Oral BID   sodium chloride flush  3 mL Intravenous Q12H   triamcinolone 0.1 % cream : eucerin    Topical BID   Warfarin - Pharmacist Dosing Inpatient   Does not apply q1600   Continuous Infusions:   LOS: 4 days   Time spent: 25 minutes   Darliss Cheney, MD [facility]  11/04/2021, 8:08 AM  Please page via Shea Evans and do not message via secure chat for anything urgent. Secure chat can be used for anything non urgent.  How to contact the [facility] Attending or Consulting provider [place] or covering provider during after hours [place], for this patient?  Check the care team in [facility] and look for a) attending/consulting TR[facility] provider listed and b) the [facility] team listed. Page or secure chat [phone]. Log into [email or web address] and use [place]'s universal password to access. If you do not have the password, please contact the hospital operator. Locate the [facility] provider you are looking for under [facility] and page to a number that you can be directly reached. If you still have difficulty reaching the provider, please page the [facility] (Director on Call) for the Hospitalists listed on amion for assistance.

## 2021-11-04 NOTE — Progress Notes (Addendum)
ANTICOAGULATION CONSULT NOTE - Follow Up Consult  Pharmacy Consult for warfarin + heparin Indication: atrial fibrillation  No Known Allergies  Patient Measurements: Height: 5\' 8"  (172.7 cm) Weight: 93 kg (205 lb 0.4 oz) IBW/kg (Calculated) : 68.4  Vital Signs: Temp: 98.1 F (36.7 C) (12/27 0510) Temp Source: Oral (12/27 0510) BP: 160/116 (12/27 0510) Pulse Rate: 102 (12/27 0510)  Labs: Recent Labs    11/02/21 0238 11/03/21 0737 11/04/21 0429  HGB 11.9*  --   --   HCT 38.5*  --   --   PLT 151  --   --   LABPROT 19.3* 18.6* 16.2*  INR 1.6* 1.6* 1.3*  CREATININE 1.68* 1.60* 1.60*     Estimated Creatinine Clearance: 46.8 mL/min (A) (by C-G formula based on SCr of 1.6 mg/dL (H)).   Assessment: 70 yo male admitted for acute on chronic combined systolic and diastolic CHF. He has a history of a fib and was on warfarin prior to admission.  PTA dose: 3mg  qTues/Sat, 6mg  all other days, Admit INR 1.9  INR dropped down to 1.3 despite higher dose yesterday. Oral intake documented at 100%. Given low INR and now in Afib requiring amiodarone will bridge with heparin for now.    Goal of Therapy:  Heparin level: 0.3-0.7  INR 2-3 Monitor platelets by anticoagulation protocol: Yes   Plan:  Start heparin infusion at 1250 units/hr  Order heparin level in 8 hours Warfarin 5 mg x1 (higher than normal 3 mg dose to get INR trending in correct direction) Daily INR Monitor for s/sx of bleeding   Thank you for allowing pharmacy to participate in this patient's care.  62, PharmD, BCCCP Clinical Pharmacist  Phone: [phone] 11/04/2021 8:56 AM  Please check AMION for all [facility] Pharmacy phone numbers After 10:00 PM, call Main Pharmacy [phone]

## 2021-11-04 NOTE — Progress Notes (Signed)
At 550 pm the patient loss IV access to his heparin drip in the right arm. Pressure was applied to stop the bleeding and held in place. Corky Crafts was changed. Pt could not tell nurses what happened for the IV to become dislodged.  Amiodarone drip is still infusing in left arm.

## 2021-11-04 NOTE — Progress Notes (Signed)
ANTICOAGULATION CONSULT NOTE - Follow Up Consult  Pharmacy Consult for warfarin + heparin Indication: atrial fibrillation  No Known Allergies  Patient Measurements: Height: 5\' 8"  (172.7 cm) Weight: 93 kg (205 lb 0.4 oz) IBW/kg (Calculated) : 68.4  Vital Signs: Temp: 97.9 F (36.6 C) (12/27 1123) Temp Source: Oral (12/27 1831) BP: 105/63 (12/27 1831) Pulse Rate: 63 (12/27 1831)  Labs: Recent Labs    11/02/21 0238 11/03/21 0737 11/04/21 0429 11/04/21 1904  HGB 11.9*  --   --   --   HCT 38.5*  --   --   --   PLT 151  --   --   --   LABPROT 19.3* 18.6* 16.2*  --   INR 1.6* 1.6* 1.3*  --   HEPARINUNFRC  --   --   --  <0.10*  CREATININE 1.68* 1.60* 1.60*  --     Estimated Creatinine Clearance: 46.8 mL/min (A) (by C-G formula based on SCr of 1.6 mg/dL (H)).   Assessment: 71 yo male admitted for acute on chronic combined systolic and diastolic CHF. He has a history of a fib and was on warfarin prior to admission.  PTA dose: 3mg  qTues/Sat, 6mg  all other days, Admit INR 1.9  INR dropped down to 1.3 despite higher dose yesterday. Oral intake documented at 100%. Given low INR and now in Afib requiring amiodarone will bridge with heparin for now.    PM UPDATE: Heparin was stopped ~17:15 per RN due to IV site loss so level at 1900 is after heparin clearance. Heparin was resumed at 7:53 PM- will retime 8 hour level.   Goal of Therapy:  Heparin level: 0.3-0.7  INR 2-3 Monitor platelets by anticoagulation protocol: Yes   Plan:  Restart heparin infusion at 1250 units/hr  Order heparin level in 8 hours Monitor for s/sx of bleeding   Thank you for allowing pharmacy to participate in this patient's care.  62, PharmD, BCPS, BCCCP Clinical Pharmacist Please refer to [facility] for [facility] Pharmacy numbers 11/04/2021 7:52 PM  Please check AMION for all [facility] Pharmacy phone numbers After 10:00 PM, call Main Pharmacy [phone]

## 2021-11-04 NOTE — Progress Notes (Signed)
Pharmacy was notified that IV site was loss for heparin drip and that patient was waiting on new IV access to be placed by IV team.

## 2021-11-04 NOTE — Progress Notes (Signed)
Physical Therapy Treatment Patient Details Name: [name] MRN: [id number] DOB: July 20, 1951 Today's Date: 11/04/2021   History of Present Illness Pt is a 70 y.o. male admitted 10/28/21 with edema, weight gain, SOB. Workup for CHF exacerbation, AKI. PMH includes CHF, HTN, CKD, DM; admission 05/2021 for LLE vascular intervention.    PT Comments    Pt on heparin drip and IV battery drained so PT session limited. Pt displeased with onset of Afib and necessity of heparin drip, ultimately agrees to performance of standing exercises at sink. D/c plans remain appropriate.     Recommendations for follow up therapy are one component of a multi-disciplinary discharge planning process, led by the attending physician.  Recommendations may be updated based on patient status, additional functional criteria and insurance authorization.  Follow Up Recommendations  No PT follow up     Assistance Recommended at Discharge Intermittent Supervision/Assistance  Equipment Recommendations  None recommended by PT       Precautions / Restrictions Precautions Precautions: Fall Restrictions Weight Bearing Restrictions: No     Mobility  Bed Mobility               General bed mobility comments: Pt sitting up on EoB on entry    Transfers Overall transfer level: Needs assistance Equipment used: Straight cane Transfers: Sit to/from Stand Sit to Stand: Min guard           General transfer comment: min guard for power up to [facility], R knee extended for power up    Ambulation/Gait Ambulation/Gait assistance: Supervision Gait Distance (Feet): 20 Feet Assistive device: Straight cane;Rolling walker (2 wheels) Gait Pattern/deviations: Step-through pattern;Decreased stride length;Trunk flexed Gait velocity: reduced Gait velocity interpretation: <1.31 ft/sec, indicative of household ambulator   General Gait Details: slow, steady gait         Balance Overall balance assessment: Needs  assistance   Sitting balance-Leahy Scale: Good     Standing balance support: No upper extremity supported;Single extremity supported;During functional activity Standing balance-Leahy Scale: Fair Standing balance comment: can static stand and take steps without UE support; preference for [facility]                            Cognition Arousal/Alertness: Awake/alert Behavior During Therapy: WFL for tasks assessed/performed Overall Cognitive Status: No family/caregiver present to determine baseline cognitive functioning                                 General Comments: Tangential and verbose with speech, unrelated topics, requires frequent redirection to conversation        Exercises General Exercises - Lower Extremity Ankle Circles/Pumps: AROM;Both;10 reps Hip ABduction/ADduction: AROM;Both;10 reps;Standing Hip Flexion/Marching: AROM;Both;10 reps;Standing Heel Raises: AROM;Strengthening;Both;20 reps;Standing (with UE support at sink) Mini-Sqauts: Strengthening;Both;20 reps;Standing (with UE support at sink) Other Exercises Other Exercises: standing knee flexion x10 Other Exercises: hip extension x 10    General Comments General comments (skin integrity, edema, etc.): Pt on heparin drip and IV battery dead, VSS on RA      Pertinent Vitals/Pain Pain Assessment: No/denies pain     PT Goals (current goals can now be found in the care plan section) Acute Rehab PT Goals Patient Stated Goal: to speak with a Child psychotherapist PT Goal Formulation: With patient Time For Goal Achievement: 11/12/21 Potential to Achieve Goals: Good Progress towards PT goals: Progressing toward goals    Frequency  Min 3X/week      PT Plan Current plan remains appropriate       AM-PAC PT "6 Clicks" Mobility   Outcome Measure  Help needed turning from your back to your side while in a flat bed without using bedrails?: A Little Help needed moving from lying on your back to  sitting on the side of a flat bed without using bedrails?: A Little Help needed moving to and from a bed to a chair (including a wheelchair)?: A Little Help needed standing up from a chair using your arms (e.g., wheelchair or bedside chair)?: A Little Help needed to walk in hospital room?: A Little Help needed climbing 3-5 steps with a railing? : A Little 6 Click Score: 18    End of Session   Activity Tolerance: Patient tolerated treatment well Patient left: in chair;with call bell/phone within reach;with chair alarm set Nurse Communication: Mobility status PT Visit Diagnosis: Other abnormalities of gait and mobility (R26.89);Unsteadiness on feet (R26.81)     Time: 1553-1610 PT Time Calculation (min) (ACUTE ONLY): 17 min  Charges:  $Therapeutic Exercise: 8-22 mins                     Maryna Yeagle B. Beverely Risen PT, DPT Acute Rehabilitation Services Pager [phone] Office [phone]    Elon Alas Fleet 11/04/2021, 4:18 PM

## 2021-11-04 NOTE — Progress Notes (Signed)
CARDIAC REHAB PHASE I   PRE:  Rate/Rhythm: 113 AFib  BP:  Supine:   Sitting: 91/76  Standing:    SaO2:   MODE:  Ambulation: 460 ft   POST:  Rate/Rhythm: 124 A Fib  BP:  Supine:   Sitting: 88/63  Standing:    SaO2:  Tolerated ambulation well with minimal assistance x1 and rolling walker.  Stopped 4 times with standing breaks. [phone]  Cindra Eves RN, BSN 11/04/2021 8:58 AM

## 2021-11-04 NOTE — Progress Notes (Signed)
PT Cancellation Note  Patient Details Name: GRACEN SOUTHWELL MRN: [id number] DOB: 07/06/1951   Cancelled Treatment:    Reason Eval/Treat Not Completed: (P) Medical issues which prohibited therapy Pt with decreased HR and BP, RN starting heparin drip and request follow back after lunch. PT will follow back then.  Raylene Carmickle B. Beverely Risen PT, DPT Acute Rehabilitation Services Pager [phone] Office [phone]    Elon Alas Fleet 11/04/2021, 11:47 AM

## 2021-11-04 NOTE — Progress Notes (Addendum)
Advanced Heart Failure Rounding Note  PCP-Cardiologist: None   Subjective:   Up 2 lb overnight, but down 27 lb from admit.  Scr 1.60 -> 1.68 -> 1.68 -> 1.60 -> 1.60. BUN now trending up, 21 > 26 > 33 > 44  K 3.4  Appears to be back in afib.  Reports orthostatic dizziness  Denies CP and dyspnea. Leg edema resolved.    Objective:   Weight Range: 93 kg Body mass index is 31.17 kg/m.   Vital Signs:   Temp:  [97.6 F (36.4 C)-98.1 F (36.7 C)] 98.1 F (36.7 C) (12/27 0510) Pulse Rate:  [60-102] 102 (12/27 0510) Resp:  [17-20] 20 (12/27 0510) BP: (120-160)/(73-116) 160/116 (12/27 0510) SpO2:  [90 %] 90 % (12/26 2012) Weight:  [92.3 kg-93 kg] 93 kg (12/27 0510) Last BM Date: 11/03/21  Weight change: Filed Weights   11/03/21 0509 11/03/21 1640 11/04/21 0510  Weight: 92.3 kg 92.3 kg 93 kg    Intake/Output:   Intake/Output Summary (Last 24 hours) at 11/04/2021 0925 Last data filed at 11/04/2021 0818 Gross per 24 hour  Intake 360 ml  Output 2050 ml  Net -1690 ml      Physical Exam   General:  No distress, sitting up in chair HEENT: normal Neck: supple. no JVD. Carotids 2+ bilat; no bruits. No lymphadenopathy or thryomegaly appreciated. Cor: PMI nondisplaced. Irregular rhythm. No rubs, gallops or murmurs. Lungs: clear Abdomen: soft, nontender, nondistended. No hepatosplenomegaly. No bruits or masses. Good bowel sounds. Extremities: no cyanosis, clubbing, rash, trace edema  Neuro: alert & orientedx3, cranial nerves grossly intact. moves all 4 extremities w/o difficulty. Affect pleasant   Telemetry   ? Sinus with PACs versus AF 80s-90s  Labs    CBC Recent Labs    11/02/21 0238  WBC 6.3  HGB 11.9*  HCT 38.5*  MCV 90.2  PLT 151   Basic Metabolic Panel Recent Labs    29/79/89 0737 11/04/21 0429  NA 139 138  K 3.9 3.4*  CL 98 98  CO2 29 29  GLUCOSE 144* 173*  BUN 33* 44*  CREATININE 1.60* 1.60*  CALCIUM 8.8* 8.8*   Liver Function  Tests No results for input(s): AST, ALT, ALKPHOS, BILITOT, PROT, ALBUMIN in the last 72 hours.  No results for input(s): LIPASE, AMYLASE in the last 72 hours. Cardiac Enzymes No results for input(s): CKTOTAL, CKMB, CKMBINDEX, TROPONINI in the last 72 hours.  BNP: BNP (last 3 results) Recent Labs    10/28/21 2112  BNP 1,165.7*    ProBNP (last 3 results) No results for input(s): PROBNP in the last 8760 hours.   D-Dimer No results for input(s): DDIMER in the last 72 hours. Hemoglobin A1C No results for input(s): HGBA1C in the last 72 hours. Fasting Lipid Panel No results for input(s): CHOL, HDL, LDLCALC, TRIG, CHOLHDL, LDLDIRECT in the last 72 hours. Thyroid Function Tests No results for input(s): TSH, T4TOTAL, T3FREE, THYROIDAB in the last 72 hours.  Invalid input(s): FREET3  Other results:   Imaging    No results found.   Medications:     Scheduled Medications:  dapagliflozin propanediol  10 mg Oral Daily   docusate sodium  100 mg Oral BID   furosemide  80 mg Intravenous BID   insulin aspart  0-15 Units Subcutaneous TID WC   insulin aspart  0-5 Units Subcutaneous QHS   insulin glargine-yfgn  15 Units Subcutaneous Daily   metoprolol succinate  100 mg Oral BID   multivitamin  with minerals  1 tablet Oral Daily   potassium chloride  40 mEq Oral Q4H   Ensure Max Protein  11 oz Oral BID   sacubitril-valsartan  1 tablet Oral BID   sodium chloride flush  3 mL Intravenous Q12H   triamcinolone 0.1 % cream : eucerin   Topical BID   warfarin  5 mg Oral ONCE-1600   Warfarin - Pharmacist Dosing Inpatient   Does not apply q1600    Infusions:   PRN Medications: acetaminophen **OR** acetaminophen, bisacodyl, hydrALAZINE, ondansetron **OR** ondansetron (ZOFRAN) IV, polyethylene glycol, traZODone    Patient Profile  [name] isa 70 year old with a history of  PAF, severe depression, LV thrombus, DM2, CKD stage III, obesity, OSA refused sleep study, and chronic  systolic HF.  Admitted with volume overload.   Assessment/Plan   1. Acute on chronic systolic HF - Admitted with marked volume overload. Not taking medications as prescribed. - Myoview 12/2015. EF 20% with defects noted on rest and stress images within the inferior wall and to lesser extent the septum suggest ischemic etiology. No reversibility. Refused cath multiple times. No s/s angina currently. HsTrop not checked on admit. ECG with nonspecific inferolateral TW abnormalities - Echo 10/21 EF 55-60% - Echo this admit EF 45-50%, RV moderately reduced, RVSP 85 mmHg - Weight down 27 lb this admit with diuresis. Continues on IV lasix. BUN trending up and complaining of orthostatic symptoms. Stop diuretics. - Start furosemide 40 mg daily tomorrow. - Continue Entresto 24/26 bid. Copay $45. - Continue farxiga 10 mg daily. Copay $95. Discussed with PharmD. Will switch to Jardiance, copay $45 - Continue toprol xl   2. CKD Stage IIIa-IIIb - Creatinine on admit 1.5-->1.45 ->1.60 -> 1.68 -> 1.68 ->1.60 -> 1.60 - Follow BMET daily    3. DMII - Hgb A1C 8.2. On SSI - Continue Jardiance  4. HTN - Improved with addition of Entresto - Now with some orthostatic symptoms. Hold diuretics today as above   5. PAF - Appears to be back in AF with ventricular rates in 80s-90s.  - Check ECG - Continue toprol xl for now - Give amiodarone bolus followed by gtt - On warfarin, INR 1.3. Start heparin gtt. Discussed with PharmD. - May need TEE/DCCV if does not convert chemically. Would like to restore NSR prior to discharge.   6.  H/O LV Thrombus - resolved  7. Hypokalemia - K 3.4 - Supp   Length of Stay: 4  FINCH, LINDSAY N, PA-C  11/04/2021, 9:25 AM  Advanced Heart Failure Team Pager [phone] (M-F; 7a - 5p)  Please contact [facility] for night-coverage after hours (5p -7a ) and weekends on [email or web address]  Patient seen with PA, agree with the above note.   Patient has diuresed well,  creatinine and BUN mildly higher today.  He has gone into atrial fibrillation overnight.  Feels fatigued.   General: NAD Neck: No JVD, no thyromegaly or thyroid nodule.  Lungs: Clear to auscultation bilaterally with normal respiratory effort. CV: Nondisplaced PMI.  Heart irregular S1/S2, no S3/S4, no murmur.  No peripheral edema.   Abdomen: Soft, nontender, no hepatosplenomegaly, no distention.  Skin: Intact without lesions or rashes.  Neurologic: Alert and oriented x 3.  Psych: Normal affect. Extremities: No clubbing or cyanosis.  HEENT: Normal.   Volume status looks ok.  Hold diuretics today, start Lasix 40 mg daily tomorrow.   In atrial fibrillation that started overnight, INR subtherapeutic.  Start heparin gtt and continue warfarin.  Will start amiodarone gtt for now.  If he does not convert spontaneously, would aim for TEE-guided DCCV.   Loralie Champagne 11/04/2021 1:23 PM

## 2021-11-04 NOTE — TOC Benefit Eligibility Note (Addendum)
Patient Advocate Encounter ° °Insurance verification completed.   ° °The patient is currently admitted and upon discharge could be taking Jardiance 10 mg. ° °The current 30 day co-pay is, $45.00.  ° °The patient is currently admitted and upon discharge could be taking Farxiga 10 mg. ° °The current 30 day co-pay is, $95.00.  ° °The patient is currently admitted and upon discharge could be taking Entresto 24-26 mg. ° °The current 30 day co-pay is, $45.00.  ° °The patient is insured through [employer] Medicare Part D  ° ° ° °Delonta Yohannes, CPhT °Pharmacy Patient Advocate Specialist °[facility] Patient Advocate Team °Direct Number: [phone]  Fax: [phone] ° ° ° ° ° °  °

## 2021-11-04 NOTE — Progress Notes (Signed)
Patient complaining about his [employer]. He insisted to get it taken off.  Educated about its need but patients refused to keep it. Unna boots removed.

## 2021-11-05 DIAGNOSIS — I5043 Acute on chronic combined systolic (congestive) and diastolic (congestive) heart failure: Secondary | ICD-10-CM | POA: Diagnosis not present

## 2021-11-05 DIAGNOSIS — I4819 Other persistent atrial fibrillation: Secondary | ICD-10-CM | POA: Diagnosis not present

## 2021-11-05 LAB — CBC
HCT: 42 % (ref 39.0–52.0)
Hemoglobin: 13.1 g/dL (ref 13.0–17.0)
MCH: 27.7 pg (ref 26.0–34.0)
MCHC: 31.2 g/dL (ref 30.0–36.0)
MCV: 88.8 fL (ref 80.0–100.0)
Platelets: 213 10*3/uL (ref 150–400)
RBC: 4.73 MIL/uL (ref 4.22–5.81)
RDW: 16.1 % — ABNORMAL HIGH (ref 11.5–15.5)
WBC: 9.3 10*3/uL (ref 4.0–10.5)
nRBC: 0 % (ref 0.0–0.2)

## 2021-11-05 LAB — GLUCOSE, CAPILLARY
Glucose-Capillary: 175 mg/dL — ABNORMAL HIGH (ref 70–99)
Glucose-Capillary: 242 mg/dL — ABNORMAL HIGH (ref 70–99)
Glucose-Capillary: 175 mg/dL — ABNORMAL HIGH (ref 70–99)
Glucose-Capillary: 147 mg/dL — ABNORMAL HIGH (ref 70–99)

## 2021-11-05 LAB — BASIC METABOLIC PANEL
Anion gap: 11 (ref 5–15)
BUN: 49 mg/dL — ABNORMAL HIGH (ref 8–23)
CO2: 27 mmol/L (ref 22–32)
Calcium: 8.6 mg/dL — ABNORMAL LOW (ref 8.9–10.3)
Chloride: 99 mmol/L (ref 98–111)
Creatinine, Ser: 1.85 mg/dL — ABNORMAL HIGH (ref 0.61–1.24)
GFR, Estimated: 38 mL/min — ABNORMAL LOW (ref >60)
Glucose, Bld: 140 mg/dL — ABNORMAL HIGH (ref 70–99)
Potassium: 3.8 mmol/L (ref 3.5–5.1)
Sodium: 137 mmol/L (ref 135–145)

## 2021-11-05 LAB — PROTIME-INR
INR: 2.1 — ABNORMAL HIGH (ref 0.8–1.2)
INR: 2.2 — ABNORMAL HIGH (ref 0.8–1.2)
INR: 2.3 — ABNORMAL HIGH (ref 0.8–1.2)
Prothrombin Time: 23.2 seconds — ABNORMAL HIGH (ref 11.4–15.2)
Prothrombin Time: 24.1 seconds — ABNORMAL HIGH (ref 11.4–15.2)
Prothrombin Time: 24.9 seconds — ABNORMAL HIGH (ref 11.4–15.2)

## 2021-11-05 LAB — HEPARIN LEVEL (UNFRACTIONATED): Heparin Unfractionated: 0.26 IU/mL — ABNORMAL LOW (ref 0.30–0.70)

## 2021-11-05 MED ORDER — SODIUM CHLORIDE 0.9 % IV SOLN: INTRAVENOUS | Status: DC

## 2021-11-05 MED ORDER — POTASSIUM CHLORIDE CRYS ER 20 MEQ PO TBCR
40.0000 meq | EXTENDED_RELEASE_TABLET | Freq: Two times a day (BID) | ORAL | Status: DC
  Administered 2021-11-05: 10:00:00 40 meq via ORAL
  Filled 2021-11-05: qty 2

## 2021-11-05 MED ORDER — WARFARIN SODIUM 2 MG PO TABS
2.0000 mg | ORAL_TABLET | Freq: Once | ORAL | Status: AC
  Administered 2021-11-05: 18:00:00 2 mg via ORAL
  Filled 2021-11-05: qty 1

## 2021-11-05 NOTE — Progress Notes (Signed)
ANTICOAGULATION CONSULT NOTE - Follow Up Consult  Pharmacy Consult for warfarin  Indication: atrial fibrillation  No Known Allergies  Patient Measurements: Height: 5\' 8"  (172.7 cm) Weight: 92 kg (202 lb 14.4 oz) IBW/kg (Calculated) : 68.4  Vital Signs: Temp: 97.7 F (36.5 C) (12/28 0548) Temp Source: Oral (12/28 0548) BP: 127/77 (12/28 0548) Pulse Rate: 58 (12/28 0548)  Labs: Recent Labs    11/03/21 0737 11/04/21 0429 11/04/21 1904 11/05/21 0401 11/05/21 0856  HGB  --   --   --  13.1  --   HCT  --   --   --  42.0  --   PLT  --   --   --  213  --   LABPROT 18.6* 16.2*  --  23.2* 24.1*  INR 1.6* 1.3*  --  2.1* 2.2*  HEPARINUNFRC  --   --  <0.10* 0.26*  --   CREATININE 1.60* 1.60*  --  1.85*  --      Estimated Creatinine Clearance: 40.3 mL/min (A) (by C-G formula based on SCr of 1.85 mg/dL (H)).   Assessment: 70 yo male admitted for acute on chronic combined systolic and diastolic CHF. He has a history of a fib and was on warfarin prior to admission.  PTA dose: 3mg  qTues/Sat, 6mg  all other days, Admit INR 1.9  INR jumped from 1.3 to 2.1 - confirmed on recheck that INR 2.2. Okay to stop heparin infusion per conversation with MD. Hgb 13.1, plt 213. Oral intake documented at 100%. Plan for TEE/DCCV tomorrow.   Goal of Therapy:  Heparin level: 0.3-0.7  INR 2-3 Monitor platelets by anticoagulation protocol: Yes   Plan:  Stop heparin infusion Order warfarin 2 mg tonight Monitor for s/sx of bleeding   Thank you for allowing pharmacy to participate in this patient's care.  62, PharmD, BCCCP Clinical Pharmacist  Phone: [phone] 11/05/2021 10:52 AM  Please check AMION for all [facility] phone numbers After 10:00 PM, call Main Pharmacy [phone]

## 2021-11-05 NOTE — Progress Notes (Signed)
Spoke with PA Mardella Layman of HF team and patient could not get on the surgery schedule for today so pt will not be NPO unitl midnight tonight December 28th. She will reorder [facility] diet.

## 2021-11-05 NOTE — Progress Notes (Signed)
ANTICOAGULATION CONSULT NOTE - Follow Up Consult  Pharmacy Consult for heparin Indication:  Afib and h/o LV thrombus  Labs: Recent Labs    11/03/21 0737 11/04/21 0429 11/04/21 1904 11/05/21 0401  HGB  --   --   --  13.1  HCT  --   --   --  42.0  PLT  --   --   --  213  LABPROT 18.6* 16.2*  --  23.2*  INR 1.6* 1.3*  --  2.1*  HEPARINUNFRC  --   --  <0.10* 0.26*  CREATININE 1.60* 1.60*  --  1.85*    Assessment: 70yo male subtherapeutic on heparin after resuming; no infusion issues or signs of bleeding per RN.  Goal of Therapy:  Heparin level 0.3-0.7 units/ml   Plan:  Will increase heparin infusion by 1-2 units/kg/hr to 1400 units/hr and check level in 8 hours.    Vernard Gambles, PharmD, BCPS  11/05/2021,4:58 AM

## 2021-11-05 NOTE — H&P (View-Only) (Signed)
Advanced Heart Failure Rounding Note  PCP-Cardiologist: None   Subjective:   12/27 diuretics stopped  Scr 1.60 -> 1.68 -> 1.60 -> 1.85  Down total of 31 lb from admit  Remains in AF on heparin gtt with rate 60s-70s  On warfarin + heparin gtt. INR 2.1.  No dyspnea or CP, denies dizziness.  Objective:   Weight Range: 92 kg Body mass index is 30.85 kg/m.   Vital Signs:   Temp:  [97.5 F (36.4 C)-97.9 F (36.6 C)] 97.7 F (36.5 C) (12/28 0548) Pulse Rate:  [41-102] 58 (12/28 0548) Resp:  [16] 16 (12/28 0548) BP: (97-159)/(63-138) 127/77 (12/28 0548) SpO2:  [96 %-100 %] 96 % (12/28 0548) Weight:  [92 kg] 92 kg (12/28 0548) Last BM Date: 11/03/21  Weight change: Filed Weights   11/03/21 1640 11/04/21 0510 11/05/21 0548  Weight: 92.3 kg 93 kg 92 kg    Intake/Output:   Intake/Output Summary (Last 24 hours) at 11/05/2021 0858 Last data filed at 11/05/2021 0700 Gross per 24 hour  Intake 1250.9 ml  Output 1450 ml  Net -199.1 ml      Physical Exam   General:  Comfortable, lying flat in bed HEENT: normal Neck: supple. JVP flat. Carotids 2+ bilat; no bruits. No lymphadenopathy or thryomegaly appreciated. Cor: PMI nondisplaced. Irregular rhythm. No rubs, gallops or murmurs. Lungs: clear Abdomen: soft, nontender, nondistended. No hepatosplenomegaly. No bruits or masses. Good bowel sounds. Extremities: no cyanosis, clubbing, rash, trace edema Neuro: alert & orientedx3, cranial nerves grossly intact. moves all 4 extremities w/o difficulty. Affect pleasant    Telemetry   Afib/flutter 60s-70s  Labs    CBC Recent Labs    11/05/21 0401  WBC 9.3  HGB 13.1  HCT 42.0  MCV 88.8  PLT 123456   Basic Metabolic Panel Recent Labs    11/04/21 0429 11/05/21 0401  NA 138 137  K 3.4* 3.8  CL 98 99  CO2 29 27  GLUCOSE 173* 140*  BUN 44* 49*  CREATININE 1.60* 1.85*  CALCIUM 8.8* 8.6*   Liver Function Tests No results for input(s): AST, ALT, ALKPHOS,  BILITOT, PROT, ALBUMIN in the last 72 hours.  No results for input(s): LIPASE, AMYLASE in the last 72 hours. Cardiac Enzymes No results for input(s): CKTOTAL, CKMB, CKMBINDEX, TROPONINI in the last 72 hours.  BNP: BNP (last 3 results) Recent Labs    10/28/21 2112  BNP 1,165.7*    ProBNP (last 3 results) No results for input(s): PROBNP in the last 8760 hours.   D-Dimer No results for input(s): DDIMER in the last 72 hours. Hemoglobin A1C No results for input(s): HGBA1C in the last 72 hours. Fasting Lipid Panel No results for input(s): CHOL, HDL, LDLCALC, TRIG, CHOLHDL, LDLDIRECT in the last 72 hours. Thyroid Function Tests No results for input(s): TSH, T4TOTAL, T3FREE, THYROIDAB in the last 72 hours.  Invalid input(s): FREET3  Other results:   Imaging    No results found.   Medications:     Scheduled Medications:  docusate sodium  100 mg Oral BID   empagliflozin  10 mg Oral Daily   furosemide  40 mg Oral Daily   insulin aspart  0-15 Units Subcutaneous TID WC   insulin aspart  0-5 Units Subcutaneous QHS   insulin glargine-yfgn  15 Units Subcutaneous Daily   metoprolol succinate  100 mg Oral BID   multivitamin with minerals  1 tablet Oral Daily   Ensure Max Protein  11 oz Oral BID  sacubitril-valsartan  1 tablet Oral BID  ° sodium chloride flush  3 mL Intravenous Q12H  ° triamcinolone 0.1 % cream : eucerin   Topical BID  ° Warfarin - Pharmacist Dosing Inpatient   Does not apply q1600  ° ° °Infusions: ° amiodarone 30 mg/hr (11/05/21 0445)  ° heparin 1,400 Units/hr (11/05/21 0500)  ° ° °PRN Medications: °acetaminophen **OR** acetaminophen, bisacodyl, hydrALAZINE, ondansetron **OR** ondansetron (ZOFRAN) IV, polyethylene glycol, traZODone ° ° ° °Patient Profile  °Mr Cashman isa 70 year old with a history of  PAF, severe depression, LV thrombus, DM2, CKD stage III, obesity, OSA refused sleep study, and chronic systolic HF. ° °Admitted with volume  overload. ° ° °Assessment/Plan  ° °1. Acute on chronic systolic HF °- Admitted with marked volume overload. Not taking medications as prescribed. °- Myoview 12/2015. EF 20% with defects noted on rest and stress images within the inferior wall and to lesser extent the septum suggest ischemic etiology. No reversibility. Refused cath multiple times. No s/s angina currently. HsTrop not checked on admit. ECG with nonspecific inferolateral TW abnormalities °- Echo 10/21 EF 55-60% °- Echo this admit EF 45-50%, RV moderately reduced, RVSP 85 mmHg °- Weight down 31 lb this admit with diuresis. IV lasix stopped 12/27. Scr bumped, 1.6 > 1.85 today. BUN trending up. °- Hold diuretics. °- Continue Entresto 24/26 bid. Copay $45. °- Continue farxiga 10 mg daily. Copay $95. Discussed with PharmD. Will switch to Jardiance, copay $45 °- Continue toprol xl °  °2. CKD Stage IIIa-IIIb °- Creatinine on admit 1.5-->1.45 ->1.60 -> 1.68 -> 1.60, up to 1.85 today.  °- Suspect bump in Cr d/t diuresis °- Remain off diuretic for now °- Follow BMET °  °3. DMII °- Hgb A1C 8.2. On SSI °- Continue Jardiance ° °4. HTN °- Improved with addition of Entresto °- BP stable today °  °5. PAF/? AFL °- Back in AF vs AFL with ventricular rates in 60s-70s  °- Continue toprol xl for now °- On amiodarone gtt at 30/hr °- On warfarin, INR 2.1. On heparin gtt. Discussed with PharmD, continue heparin for now in anticipation of DCCV °- Will try to aim for TEE/DCCV later this afternoon or tomorrow am °  °6.  H/O LV Thrombus °- resolved ° °7. Hypokalemia °- K 3.8 °- Supp ° ° °Length of Stay: 5 ° °FINCH, LINDSAY N, PA-C  °11/05/2021, 8:58 AM ° °Advanced Heart Failure Team °Pager [phone] (M-F; 7a - 5p)  °Please contact [facility] for night-coverage after hours (5p -7a ) and weekends on [email or web address] ° °Patient seen with PA, agree with the above note.  ° °He remains in what appears to be an atypical atrial flutter.  Creatinine higher 1.85.  ° °No complaints.  ° °General:  NAD °Neck: No JVD, no thyromegaly or thyroid nodule.  °Lungs: Clear to auscultation bilaterally with normal respiratory effort. °CV: Nondisplaced PMI.  Heart irregular S1/S2, no S3/S4, no murmur.  No peripheral edema.   °Abdomen: Soft, nontender, no hepatosplenomegaly, no distention.  °Skin: Intact without lesions or rashes.  °Neurologic: Alert and oriented x 3.  °Psych: Normal affect. °Extremities: No clubbing or cyanosis.  °HEENT: Normal.  ° °He has diuresed well and is not volume overloaded.  With rising creatinine, will hold diuretic again, probably start Lasix 40 mg daily tomorrow.  ° °He is still in atypical atrial flutter.   °- Continue amiodarone gtt.  °- INR now therapeutic on warfarin.  °- Scheduled for TEE-guided DCCV tomorrow.  ° °  [name] 11/05/2021 1:41 PM

## 2021-11-05 NOTE — Progress Notes (Signed)
Mobility Specialist Progress Note:   11/05/21 1234  Mobility  Activity Refused mobility   [facility] Primary Phone [phone] Secondary Phone [phone]

## 2021-11-05 NOTE — Progress Notes (Addendum)
Advanced Heart Failure Rounding Note  PCP-Cardiologist: None   Subjective:   12/27 diuretics stopped  Scr 1.60 -> 1.68 -> 1.60 -> 1.85  Down total of 31 lb from admit  Remains in AF on heparin gtt with rate 60s-70s  On warfarin + heparin gtt. INR 2.1.  No dyspnea or CP, denies dizziness.  Objective:   Weight Range: 92 kg Body mass index is 30.85 kg/m.   Vital Signs:   Temp:  [97.5 F (36.4 C)-97.9 F (36.6 C)] 97.7 F (36.5 C) (12/28 0548) Pulse Rate:  [41-102] 58 (12/28 0548) Resp:  [16] 16 (12/28 0548) BP: (97-159)/(63-138) 127/77 (12/28 0548) SpO2:  [96 %-100 %] 96 % (12/28 0548) Weight:  [92 kg] 92 kg (12/28 0548) Last BM Date: 11/03/21  Weight change: Filed Weights   11/03/21 1640 11/04/21 0510 11/05/21 0548  Weight: 92.3 kg 93 kg 92 kg    Intake/Output:   Intake/Output Summary (Last 24 hours) at 11/05/2021 0858 Last data filed at 11/05/2021 0700 Gross per 24 hour  Intake 1250.9 ml  Output 1450 ml  Net -199.1 ml      Physical Exam   General:  Comfortable, lying flat in bed HEENT: normal Neck: supple. JVP flat. Carotids 2+ bilat; no bruits. No lymphadenopathy or thryomegaly appreciated. Cor: PMI nondisplaced. Irregular rhythm. No rubs, gallops or murmurs. Lungs: clear Abdomen: soft, nontender, nondistended. No hepatosplenomegaly. No bruits or masses. Good bowel sounds. Extremities: no cyanosis, clubbing, rash, trace edema Neuro: alert & orientedx3, cranial nerves grossly intact. moves all 4 extremities w/o difficulty. Affect pleasant    Telemetry   Afib/flutter 60s-70s  Labs    CBC Recent Labs    11/05/21 0401  WBC 9.3  HGB 13.1  HCT 42.0  MCV 88.8  PLT 123456   Basic Metabolic Panel Recent Labs    11/04/21 0429 11/05/21 0401  NA 138 137  K 3.4* 3.8  CL 98 99  CO2 29 27  GLUCOSE 173* 140*  BUN 44* 49*  CREATININE 1.60* 1.85*  CALCIUM 8.8* 8.6*   Liver Function Tests No results for input(s): AST, ALT, ALKPHOS,  BILITOT, PROT, ALBUMIN in the last 72 hours.  No results for input(s): LIPASE, AMYLASE in the last 72 hours. Cardiac Enzymes No results for input(s): CKTOTAL, CKMB, CKMBINDEX, TROPONINI in the last 72 hours.  BNP: BNP (last 3 results) Recent Labs    10/28/21 2112  BNP 1,165.7*    ProBNP (last 3 results) No results for input(s): PROBNP in the last 8760 hours.   D-Dimer No results for input(s): DDIMER in the last 72 hours. Hemoglobin A1C No results for input(s): HGBA1C in the last 72 hours. Fasting Lipid Panel No results for input(s): CHOL, HDL, LDLCALC, TRIG, CHOLHDL, LDLDIRECT in the last 72 hours. Thyroid Function Tests No results for input(s): TSH, T4TOTAL, T3FREE, THYROIDAB in the last 72 hours.  Invalid input(s): FREET3  Other results:   Imaging    No results found.   Medications:     Scheduled Medications:  docusate sodium  100 mg Oral BID   empagliflozin  10 mg Oral Daily   furosemide  40 mg Oral Daily   insulin aspart  0-15 Units Subcutaneous TID WC   insulin aspart  0-5 Units Subcutaneous QHS   insulin glargine-yfgn  15 Units Subcutaneous Daily   metoprolol succinate  100 mg Oral BID   multivitamin with minerals  1 tablet Oral Daily   Ensure Max Protein  11 oz Oral BID  sacubitril-valsartan  1 tablet Oral BID   sodium chloride flush  3 mL Intravenous Q12H   triamcinolone 0.1 % cream : eucerin   Topical BID   Warfarin - Pharmacist Dosing Inpatient   Does not apply q1600    Infusions:  amiodarone 30 mg/hr (11/05/21 0445)   heparin 1,400 Units/hr (11/05/21 0500)    PRN Medications: acetaminophen **OR** acetaminophen, bisacodyl, hydrALAZINE, ondansetron **OR** ondansetron (ZOFRAN) IV, polyethylene glycol, traZODone    Patient Profile  Mr Scharrer isa 70 year old with a history of  PAF, severe depression, LV thrombus, DM2, CKD stage III, obesity, OSA refused sleep study, and chronic systolic HF.  Admitted with volume  overload.   Assessment/Plan   1. Acute on chronic systolic HF - Admitted with marked volume overload. Not taking medications as prescribed. - Myoview 12/2015. EF 20% with defects noted on rest and stress images within the inferior wall and to lesser extent the septum suggest ischemic etiology. No reversibility. Refused cath multiple times. No s/s angina currently. HsTrop not checked on admit. ECG with nonspecific inferolateral TW abnormalities - Echo 10/21 EF 55-60% - Echo this admit EF 45-50%, RV moderately reduced, RVSP 85 mmHg - Weight down 31 lb this admit with diuresis. IV lasix stopped 12/27. Scr bumped, 1.6 > 1.85 today. BUN trending up. - Hold diuretics. - Continue Entresto 24/26 bid. Copay $45. - Continue farxiga 10 mg daily. Copay $95. Discussed with PharmD. Will switch to Jardiance, copay $45 - Continue toprol xl   2. CKD Stage IIIa-IIIb - Creatinine on admit 1.5-->1.45 ->1.60 -> 1.68 -> 1.60, up to 1.85 today.  - Suspect bump in Cr d/t diuresis - Remain off diuretic for now - Follow BMET   3. DMII - Hgb A1C 8.2. On SSI - Continue Jardiance  4. HTN - Improved with addition of Entresto - BP stable today   5. PAF/? AFL - Back in AF vs AFL with ventricular rates in 60s-70s  - Continue toprol xl for now - On amiodarone gtt at 30/hr - On warfarin, INR 2.1. On heparin gtt. Discussed with PharmD, continue heparin for now in anticipation of DCCV - Will try to aim for TEE/DCCV later this afternoon or tomorrow am   6.  H/O LV Thrombus - resolved  7. Hypokalemia - K 3.8 - Supp   Length of Stay: 5  FINCH, LINDSAY N, PA-C  11/05/2021, 8:58 AM  Advanced Heart Failure Team Pager [phone] (M-F; 7a - 5p)  Please contact [facility] for night-coverage after hours (5p -7a ) and weekends on [email or web address]  Patient seen with PA, agree with the above note.   He remains in what appears to be an atypical atrial flutter.  Creatinine higher 1.85.   No complaints.   General:  NAD Neck: No JVD, no thyromegaly or thyroid nodule.  Lungs: Clear to auscultation bilaterally with normal respiratory effort. CV: Nondisplaced PMI.  Heart irregular S1/S2, no S3/S4, no murmur.  No peripheral edema.   Abdomen: Soft, nontender, no hepatosplenomegaly, no distention.  Skin: Intact without lesions or rashes.  Neurologic: Alert and oriented x 3.  Psych: Normal affect. Extremities: No clubbing or cyanosis.  HEENT: Normal.   He has diuresed well and is not volume overloaded.  With rising creatinine, will hold diuretic again, probably start Lasix 40 mg daily tomorrow.   He is still in atypical atrial flutter.   - Continue amiodarone gtt.  - INR now therapeutic on warfarin.  - Scheduled for TEE-guided DCCV tomorrow.  [name] 11/05/2021 1:41 PM

## 2021-11-05 NOTE — Progress Notes (Signed)
CARDIAC REHAB PHASE I   PRE:  Rate/Rhythm: 63 afib    BP: sitting 110/62    SaO2: 96 RA  MODE:  Ambulation: 400 ft   POST:  Rate/Rhythm: 76 afib    BP: sitting 122/75     SaO2: 98 RA  Tolerated well with cane and standby assist. No c/o, talked entire walk. VSS. [phone]   Harriet Masson CES, ACSM 11/05/2021 3:27 PM

## 2021-11-05 NOTE — Consult Note (Signed)
° °  [facility] Inpatient Consult   11/05/2021  [name] Dec 10, 1950 [id number]  [facility] [[facility]]  Accountable Care Organization [ACO] Patient:  Primary Care Provider:  Caesar Bookman, NP, [facility], provider does the transition of care follow up and call.  Patient screened for hospitalization with noted for potential [facility]  [[facility]] Care Management service needs for post hospital transition.  Review of patient's medical record reveals patient is HF patient. Spoke with patient at bedside.  Patient states he uses [employer] for transportation. He endorses [employer] [facility] as his primary and states, "I saw someone different the last time, I don't know who is really my doctor except I guess it's Dr. Alwyn Ren."  Encouraged patient to use benefits with his [employer] if for transportation needs with customer services.  Gave patient a reminder card for PCP with 24 our nurse advise line.  Patient states he wouldn't mind a follow up with telephonic RN call.  Plan:  Continue to follow progress and disposition to assess for post hospital care management needs.  Refer patient for complex disease management closer to transitioning home.  For questions contact:   Charlesetta Shanks, RN BSN CCM [facility]  [phone] business mobile phone Toll free office [phone]  Fax number: [phone] [email or web address]

## 2021-11-05 NOTE — Progress Notes (Signed)
[name]  SWN:[id number] DOB: [birth year] DOA: 10/28/2021 PCP: Caesar Bookman, NP    Brief Narrative:  70 year old with a history of ADHD, chronic atrial fibrillation on Coumadin, chronic systolic CHF, CKD stage IIIa, DM2, peripheral vascular disease status post left fifth toe amputation, visual impairment, HTN, and OSA who presented to the ED with peripheral edema, weight gain, and shortness of breath.  He had gained 33 pounds over 1 month.  Significant Events:    Consultants:  Advanced CHF team  Code Status: FULL CODE  Antimicrobials:  None  DVT prophylaxis: Warfarin  Subjective: Resting comfortably sitting up at bedside at time of my exam.  Reports no shortness of breath or chest pain.  Denies abdominal pain.  States he is spoken with the CHF team at length today.  Assessment & Plan:  Acute on chronic combined systolic and diastolic CHF TTE this admission notes EF 45-50% with grade 2 DD -care per cardiology -diuresis presently on hold due to worsening renal function and orthostasis  Chronic paroxysmal atrial fibrillation Remains in atrial fibrillation presently -for DCCV 12/29  CKD stage III AAA Baseline creatinine approximately 1.5 - trending upward w/ diuresis - diuretic currently on  hold - follow trend   Recent Labs  Lab 11/01/21 0440 11/02/21 0238 11/03/21 0737 11/04/21 0429 11/05/21 0401  CREATININE 1.68* 1.68* 1.60* 1.60* 1.85*    Essential HTN Blood pressure controlled  DM2 Recent A1c 8.2 -insulin has been titrated during this hospital stay -CBG improving  Hypokalemia Corrected with supplementation  Recent Labs  Lab 11/01/21 0440 11/02/21 0238 11/03/21 0737 11/04/21 0429 11/05/21 0401  K 3.4* 3.3* 3.9 3.4* 3.8    Family Communication: No family present at time of exam Status is: Inpatient   Objective: Blood pressure 116/75, pulse 68, temperature 97.7 F (36.5 C), temperature source Oral, resp. rate 18, height 5\' 8"  (1.727  m), weight 92 kg, SpO2 95 %.  Intake/Output Summary (Last 24 hours) at 11/05/2021 1418 Last data filed at 11/05/2021 1346 Gross per 24 hour  Intake 1393.4 ml  Output 1400 ml  Net -6.6 ml   Filed Weights   11/03/21 1640 11/04/21 0510 11/05/21 0548  Weight: 92.3 kg 93 kg 92 kg    Examination: General: No acute respiratory distress Lungs: Clear to auscultation bilaterally without wheezes or crackles Cardiovascular: Regular rate and rhythm without murmur gallop or rub normal S1 and S2 Abdomen: Nontender, nondistended, soft, bowel sounds positive, no rebound, no ascites, no appreciable mass Extremities: No significant cyanosis, clubbing, or edema bilateral lower extremities  CBC: Recent Labs  Lab 10/30/21 0248 11/02/21 0238 11/05/21 0401  WBC 6.1 6.3 9.3  HGB 10.6* 11.9* 13.1  HCT 34.2* 38.5* 42.0  MCV 91.4 90.2 88.8  PLT 135* 151 213   Basic Metabolic Panel: Recent Labs  Lab 10/31/21 0250 11/01/21 0440 11/03/21 0737 11/04/21 0429 11/05/21 0401  NA 138   < > 139 138 137  K 3.7   < > 3.9 3.4* 3.8  CL 100   < > 98 98 99  CO2 32   < > 29 29 27   GLUCOSE 130*   < > 144* 173* 140*  BUN 22   < > 33* 44* 49*  CREATININE 1.60*   < > 1.60* 1.60* 1.85*  CALCIUM 8.6*   < > 8.8* 8.8* 8.6*  MG 2.3  --   --   --   --    < > = values in this interval not displayed.  GFR: Estimated Creatinine Clearance: 40.3 mL/min (A) (by C-G formula based on SCr of 1.85 mg/dL (H)).  Liver Function Tests: No results for input(s): AST, ALT, ALKPHOS, BILITOT, PROT, ALBUMIN in the last 168 hours. No results for input(s): LIPASE, AMYLASE in the last 168 hours. No results for input(s): AMMONIA in the last 168 hours.  Coagulation Profile: Recent Labs  Lab 11/02/21 0238 11/03/21 0737 11/04/21 0429 11/05/21 0401 11/05/21 0856  INR 1.6* 1.6* 1.3* 2.1* 2.2*    HbA1C: Hgb A1c MFr Bld  Date/Time Value Ref Range Status  06/13/2021 09:41 AM 8.2 (H) <5.7 % of total Hgb Final    Comment:     For someone without known diabetes, a hemoglobin A1c value of 6.5% or greater indicates that they may have  diabetes and this should be confirmed with a follow-up  test. . For someone with known diabetes, a value <7% indicates  that their diabetes is well controlled and a value  greater than or equal to 7% indicates suboptimal  control. A1c targets should be individualized based on  duration of diabetes, age, comorbid conditions, and  other considerations. . Currently, no consensus exists regarding use of hemoglobin A1c for diagnosis of diabetes for children. Marland Kitchen   05/22/2021 12:40 AM 10.4 (H) 4.8 - 5.6 % Final    Comment:    (NOTE) Pre diabetes:          5.7%-6.4%  Diabetes:              >6.4%  Glycemic control for   <7.0% adults with diabetes     CBG: Recent Labs  Lab 11/04/21 1124 11/04/21 1610 11/04/21 2124 11/05/21 0554 11/05/21 1046  GLUCAP 223* 174* 173* 175* 242*    Recent Results (from the past 240 hour(s))  Resp Panel by RT-PCR (Flu A&B, Covid) Nasopharyngeal Swab     Status: None   Collection Time: 10/29/21  8:26 AM   Specimen: Nasopharyngeal Swab; Nasopharyngeal(NP) swabs in vial transport medium  Result Value Ref Range Status   SARS Coronavirus 2 by RT PCR NEGATIVE NEGATIVE Final    Comment: (NOTE) SARS-CoV-2 target nucleic acids are NOT DETECTED.  The SARS-CoV-2 RNA is generally detectable in upper respiratory specimens during the acute phase of infection. The lowest concentration of SARS-CoV-2 viral copies this assay can detect is 138 copies/mL. A negative result does not preclude SARS-Cov-2 infection and should not be used as the sole basis for treatment or other patient management decisions. A negative result may occur with  improper specimen collection/handling, submission of specimen other than nasopharyngeal swab, presence of viral mutation(s) within the areas targeted by this assay, and inadequate number of viral copies(<138 copies/mL). A  negative result must be combined with clinical observations, patient history, and epidemiological information. The expected result is Negative.  Fact Sheet for Patients:  EntrepreneurPulse.com.au  Fact Sheet for Healthcare Providers:  IncredibleEmployment.be  This test is no t yet approved or cleared by the [place] FDA and  has been authorized for detection and/or diagnosis of SARS-CoV-2 by FDA under an Emergency Use Authorization (EUA). This EUA will remain  in effect (meaning this test can be used) for the duration of the COVID-19 declaration under Section 564(b)(1) of the Act, 21 U.S.C.section 360bbb-3(b)(1), unless the authorization is terminated  or revoked sooner.       Influenza A by PCR NEGATIVE NEGATIVE Final   Influenza B by PCR NEGATIVE NEGATIVE Final    Comment: (NOTE) The Xpert Xpress SARS-CoV-2/FLU/RSV plus assay is  intended as an aid in the diagnosis of influenza from Nasopharyngeal swab specimens and should not be used as a sole basis for treatment. Nasal washings and aspirates are unacceptable for Xpert Xpress SARS-CoV-2/FLU/RSV testing.  Fact Sheet for Patients: EntrepreneurPulse.com.au  Fact Sheet for Healthcare Providers: IncredibleEmployment.be  This test is not yet approved or cleared by the [place] FDA and has been authorized for detection and/or diagnosis of SARS-CoV-2 by FDA under an Emergency Use Authorization (EUA). This EUA will remain in effect (meaning this test can be used) for the duration of the COVID-19 declaration under Section 564(b)(1) of the Act, 21 U.S.C. section 360bbb-3(b)(1), unless the authorization is terminated or revoked.  Performed at [facility], [place] [address], [place], [place] [address]      Scheduled Meds:  docusate sodium  100 mg Oral BID   empagliflozin  10 mg Oral Daily   insulin aspart  0-15 Units Subcutaneous TID WC    insulin aspart  0-5 Units Subcutaneous QHS   insulin glargine-yfgn  15 Units Subcutaneous Daily   metoprolol succinate  100 mg Oral BID   multivitamin with minerals  1 tablet Oral Daily   Ensure Max Protein  11 oz Oral BID   sacubitril-valsartan  1 tablet Oral BID   sodium chloride flush  3 mL Intravenous Q12H   triamcinolone 0.1 % cream : eucerin   Topical BID   warfarin  2 mg Oral ONCE-1600   Warfarin - Pharmacist Dosing Inpatient   Does not apply q1600   Continuous Infusions:  amiodarone 30 mg/hr (11/05/21 0445)     LOS: 5 days   Cherene Altes, MD [facility] Office  [phone] Pager - Text Page per Shea Evans  If 7PM-7AM, please contact night-coverage per Amion 11/05/2021, 2:18 PM

## 2021-11-06 ENCOUNTER — Inpatient Hospital Stay (HOSPITAL_COMMUNITY): Payer: Medicare HMO

## 2021-11-06 ENCOUNTER — Encounter (HOSPITAL_COMMUNITY)
Admission: EM | Disposition: A | Discharge status: Home / Self Care | Payer: Self-pay | Source: Home / Self Care | Attending: Family Medicine

## 2021-11-06 ENCOUNTER — Inpatient Hospital Stay (HOSPITAL_COMMUNITY): Payer: Medicare HMO | Admitting: Anesthesiology

## 2021-11-06 DIAGNOSIS — I5043 Acute on chronic combined systolic (congestive) and diastolic (congestive) heart failure: Secondary | ICD-10-CM | POA: Diagnosis not present

## 2021-11-06 DIAGNOSIS — I10 Essential (primary) hypertension: Secondary | ICD-10-CM

## 2021-11-06 DIAGNOSIS — I4819 Other persistent atrial fibrillation: Secondary | ICD-10-CM | POA: Diagnosis not present

## 2021-11-06 DIAGNOSIS — N1831 Chronic kidney disease, stage 3a: Secondary | ICD-10-CM

## 2021-11-06 DIAGNOSIS — N179 Acute kidney failure, unspecified: Secondary | ICD-10-CM

## 2021-11-06 DIAGNOSIS — I4891 Unspecified atrial fibrillation: Secondary | ICD-10-CM

## 2021-11-06 HISTORY — PX: TEE WITHOUT CARDIOVERSION: SHX5443

## 2021-11-06 HISTORY — PX: CARDIOVERSION: SHX1299

## 2021-11-06 LAB — CBC
HCT: 41.8 % (ref 39.0–52.0)
Hemoglobin: 12.8 g/dL — ABNORMAL LOW (ref 13.0–17.0)
MCH: 27.5 pg (ref 26.0–34.0)
MCHC: 30.6 g/dL (ref 30.0–36.0)
MCV: 89.9 fL (ref 80.0–100.0)
Platelets: 208 10*3/uL (ref 150–400)
RBC: 4.65 MIL/uL (ref 4.22–5.81)
RDW: 16.2 % — ABNORMAL HIGH (ref 11.5–15.5)
WBC: 8.9 10*3/uL (ref 4.0–10.5)
nRBC: 0 % (ref 0.0–0.2)

## 2021-11-06 LAB — BASIC METABOLIC PANEL
Anion gap: 10 (ref 5–15)
BUN: 44 mg/dL — ABNORMAL HIGH (ref 8–23)
CO2: 25 mmol/L (ref 22–32)
Calcium: 8.7 mg/dL — ABNORMAL LOW (ref 8.9–10.3)
Chloride: 98 mmol/L (ref 98–111)
Creatinine, Ser: 1.85 mg/dL — ABNORMAL HIGH (ref 0.61–1.24)
GFR, Estimated: 38 mL/min — ABNORMAL LOW (ref >60)
Glucose, Bld: 142 mg/dL — ABNORMAL HIGH (ref 70–99)
Potassium: 4 mmol/L (ref 3.5–5.1)
Sodium: 133 mmol/L — ABNORMAL LOW (ref 135–145)

## 2021-11-06 LAB — GLUCOSE, CAPILLARY
Glucose-Capillary: 147 mg/dL — ABNORMAL HIGH (ref 70–99)
Glucose-Capillary: 172 mg/dL — ABNORMAL HIGH (ref 70–99)
Glucose-Capillary: 136 mg/dL — ABNORMAL HIGH (ref 70–99)
Glucose-Capillary: 201 mg/dL — ABNORMAL HIGH (ref 70–99)

## 2021-11-06 LAB — PROTIME-INR
INR: 2.2 — ABNORMAL HIGH (ref 0.8–1.2)
Prothrombin Time: 24.7 seconds — ABNORMAL HIGH (ref 11.4–15.2)

## 2021-11-06 SURGERY — ECHOCARDIOGRAM, TRANSESOPHAGEAL
Anesthesia: General

## 2021-11-06 MED ORDER — PROPOFOL 500 MG/50ML IV EMUL
INTRAVENOUS | Status: DC | PRN
  Administered 2021-11-06: 125 ug/kg/min via INTRAVENOUS

## 2021-11-06 MED ORDER — SODIUM CHLORIDE 0.9 % IV SOLN
INTRAVENOUS | Status: AC | PRN
  Administered 2021-11-06: 500 mL via INTRAVENOUS

## 2021-11-06 MED ORDER — MIDAZOLAM HCL (PF) 5 MG/ML IJ SOLN
INTRAMUSCULAR | Status: AC
  Filled 2021-11-06: qty 2

## 2021-11-06 MED ORDER — AMIODARONE HCL 200 MG PO TABS: 200.0000 mg | ORAL_TABLET | Freq: Two times a day (BID) | ORAL | Status: DC

## 2021-11-06 MED ORDER — LACTATED RINGERS IV SOLN: INTRAVENOUS | Status: DC | PRN

## 2021-11-06 MED ORDER — LIDOCAINE 2% (20 MG/ML) 5 ML SYRINGE
INTRAMUSCULAR | Status: DC | PRN
  Administered 2021-11-06: 30 mg via INTRAVENOUS

## 2021-11-06 MED ORDER — FENTANYL CITRATE (PF) 100 MCG/2ML IJ SOLN
INTRAMUSCULAR | Status: AC
  Filled 2021-11-06: qty 4

## 2021-11-06 MED ORDER — WARFARIN SODIUM 3 MG PO TABS
6.0000 mg | ORAL_TABLET | Freq: Once | ORAL | Status: AC
  Administered 2021-11-06: 18:00:00 6 mg via ORAL
  Filled 2021-11-06: qty 2

## 2021-11-06 MED ORDER — PROPOFOL 10 MG/ML IV BOLUS
INTRAVENOUS | Status: DC | PRN
  Administered 2021-11-06: 30 mg via INTRAVENOUS

## 2021-11-06 NOTE — Progress Notes (Signed)
°  Echocardiogram Echocardiogram Transesophageal has been performed.  [name] 11/06/2021, 2:59 PM

## 2021-11-06 NOTE — Transfer of Care (Signed)
Immediate Anesthesia Transfer of Care Note  Patient: [name]  Procedure(s) Performed: TRANSESOPHAGEAL ECHOCARDIOGRAM (TEE) CARDIOVERSION  Patient Location: Endoscopy Unit  Anesthesia Type:MAC  Level of Consciousness: drowsy and patient cooperative  Airway & Oxygen Therapy: Patient Spontanous Breathing and Patient connected to face mask oxygen  Post-op Assessment: Report given to RN and Post -op Vital signs reviewed and stable  Post vital signs: Reviewed and stable  Last Vitals:  Vitals Value Taken Time  BP 107/52   Temp    Pulse 51   Resp 16   SpO2 100     Last Pain:  Vitals:   11/06/21 1500  TempSrc: (P) Temporal  PainSc: (P) 0-No pain         Complications: No notable events documented.

## 2021-11-06 NOTE — Interval H&P Note (Signed)
History and Physical Interval Note:  11/06/2021 2:09 PM  [name]  has presented today for surgery, with the diagnosis of afib.  The various methods of treatment have been discussed with the patient and family. After consideration of risks, benefits and other options for treatment, the patient has consented to  Procedure(s): TRANSESOPHAGEAL ECHOCARDIOGRAM (TEE) (N/A) CARDIOVERSION (N/A) as a surgical intervention.  The patient's history has been reviewed, patient examined, no change in status, stable for surgery.  I have reviewed the patient's chart and labs.  Questions were answered to the patient's satisfaction.     [name] [employer]

## 2021-11-06 NOTE — Anesthesia Preprocedure Evaluation (Addendum)
Anesthesia Evaluation  Patient identified by MRN, date of birth, ID band Patient awake    Reviewed: Allergy & Precautions, NPO status , Patient's Chart, lab work & pertinent test results, reviewed documented beta blocker date and time   Airway Mallampati: III  TM Distance: >3 FB Neck ROM: Full    Dental  (+) Dental Advisory Given, Missing   Pulmonary sleep apnea , Current Smoker and Patient abstained from smoking.,    Pulmonary exam normal breath sounds clear to auscultation       Cardiovascular hypertension, Pt. on medications and Pt. on home beta blockers + Peripheral Vascular Disease and +CHF  + dysrhythmias Atrial Fibrillation  Rhythm:Irregular Rate:Abnormal  10/29/2021 ECHO: EF 45-50%. The LV has mildly decreased function. Grade II diastolic dysfunction. Elevated left atrial pressure. Mild global  left ventricular hypokinesis, slightly worse in the basal and mid segments of the inferior and inferolateral walls.  RV systolic function is moderately reduced. RV size is moderately enlarged, mild-mod TR   Neuro/Psych PSYCHIATRIC DISORDERS (ADHD) Depression    GI/Hepatic   Endo/Other  diabetes (glu 172), Type 2, Insulin Dependentobese  Renal/GU Renal InsufficiencyRenal disease (creat 1.85)     Musculoskeletal   Abdominal   Peds  Hematology  (+) Blood dyscrasia, anemia , INR 2.2 Coumadin   Anesthesia Other Findings   Reproductive/Obstetrics                            Anesthesia Physical Anesthesia Plan  ASA: 3  Anesthesia Plan: General   Post-op Pain Management: Minimal or no pain anticipated   Induction:   PONV Risk Score and Plan: 1 and Treatment may vary due to age or medical condition, Propofol infusion and TIVA  Airway Management Planned: Nasal Cannula and Natural Airway  Additional Equipment: None  Intra-op Plan:   Post-operative Plan:   Informed Consent: I have reviewed  the patients History and Physical, chart, labs and discussed the procedure including the risks, benefits and alternatives for the proposed anesthesia with the patient or authorized representative who has indicated his/her understanding and acceptance.     Dental advisory given  Plan Discussed with: CRNA  Anesthesia Plan Comments:        Anesthesia Quick Evaluation

## 2021-11-06 NOTE — Progress Notes (Signed)
[name]  HWK:[id number] DOB: 1951/06/03 DOA: 10/28/2021 PCP: Caesar Bookman, NP    Brief Narrative:  70 year old with a history of ADHD, chronic atrial fibrillation on Coumadin, chronic systolic CHF, CKD stage IIIa, DM2, peripheral vascular disease status post left fifth toe amputation, visual impairment, HTN, and OSA who presented to the ED with shortness of breath, peripheral edema, weight gain (33 pounds in 1 month),-found to have acute on chronic systolic heart failure.  Significant Events:  12/20>> admit for decompensated heart failure  Consultants:  Advanced CHF team  Code Status:  FULL CODE  Antimicrobials:  None  DVT prophylaxis: Warfarin  Subjective: Lying comfortably in bed-continues to complain of some mild dizziness when he stands up.  Objective: Vitals: Blood pressure 137/84, pulse 73, temperature 97.8 F (36.6 C), temperature source Oral, resp. rate 18, height 5\' 8"  (1.727 m), weight 92.2 kg, SpO2 96 %.   Physical exam: Gen Exam:Alert awake-not in any distress HEENT:atraumatic, normocephalic Chest: B/L clear to auscultation anteriorly CVS:S1S2 regular Abdomen:soft non tender, non distended Extremities:no edema Neurology: Non focal Skin: no rash    Assessment & Plan: Acute on chronic combined systolic and diastolic CHF (echo on 12/21 EF 45-50% with grade 2 diastolic dysfunction): Volume status stable-diuretics on hold due to mild AKI.  Remains on Entresto, Jardiance, beta-blocker  PAF paroxysmal atrial flutter: Remains in persistent flutter-plans are for cardioversion 12/29.  On amiodarone infusion-INR therapeutic  AKI on CKD stage IIIa: Mild bump in creatinine-diuretics on hold-seems to have plateaued-watch closely.  Recent Labs  Lab 11/02/21 0238 11/03/21 0737 11/04/21 0429 11/05/21 0401 11/06/21 0354  CREATININE 1.68* 1.60* 1.60* 1.85* 1.85*   Essential HTN: BP stable-continue Entresto, metoprolol.  DM2 (A1c 8.2 on 06/13/2021):  CBG stable-continue Semglee 15 units daily/SSI and Jardiance.  Recent Labs    11/05/21 1545 11/05/21 2113 11/06/21 0551  GLUCAP 175* 147* 147*    PVD s/p L 5th toe amputation  Family Communication: No family present at time of exam  Barriers to discharge: AKI with leveling off of renal function-atrial flutter-scheduled for cardioversion today.  Status is: Inpatient  CBC: Recent Labs  Lab 11/02/21 0238 11/05/21 0401 11/06/21 0354  WBC 6.3 9.3 8.9  HGB 11.9* 13.1 12.8*  HCT 38.5* 42.0 41.8  MCV 90.2 88.8 89.9  PLT 151 213 208    Basic Metabolic Panel: Recent Labs  Lab 10/31/21 0250 11/01/21 0440 11/04/21 0429 11/05/21 0401 11/06/21 0354  NA 138   < > 138 137 133*  K 3.7   < > 3.4* 3.8 4.0  CL 100   < > 98 99 98  CO2 32   < > 29 27 25   GLUCOSE 130*   < > 173* 140* 142*  BUN 22   < > 44* 49* 44*  CREATININE 1.60*   < > 1.60* 1.85* 1.85*  CALCIUM 8.6*   < > 8.8* 8.6* 8.7*  MG 2.3  --   --   --   --    < > = values in this interval not displayed.    GFR: Estimated Creatinine Clearance: 40.4 mL/min (A) (by C-G formula based on SCr of 1.85 mg/dL (H)).  Liver Function Tests: No results for input(s): AST, ALT, ALKPHOS, BILITOT, PROT, ALBUMIN in the last 168 hours. No results for input(s): LIPASE, AMYLASE in the last 168 hours. No results for input(s): AMMONIA in the last 168 hours.  Coagulation Profile: Recent Labs  Lab 11/04/21 0429 11/05/21 0401 11/05/21 0856 11/05/21 2153 11/06/21  0354  INR 1.3* 2.1* 2.2* 2.3* 2.2*     HbA1C: Hgb A1c MFr Bld  Date/Time Value Ref Range Status  06/13/2021 09:41 AM 8.2 (H) <5.7 % of total Hgb Final    Comment:    For someone without known diabetes, a hemoglobin A1c value of 6.5% or greater indicates that they may have  diabetes and this should be confirmed with a follow-up  test. . For someone with known diabetes, a value <7% indicates  that their diabetes is well controlled and a value  greater than or equal to  7% indicates suboptimal  control. A1c targets should be individualized based on  duration of diabetes, age, comorbid conditions, and  other considerations. . Currently, no consensus exists regarding use of hemoglobin A1c for diagnosis of diabetes for children. Marland Kitchen   05/22/2021 12:40 AM 10.4 (H) 4.8 - 5.6 % Final    Comment:    (NOTE) Pre diabetes:          5.7%-6.4%  Diabetes:              >6.4%  Glycemic control for   <7.0% adults with diabetes     CBG: Recent Labs  Lab 11/05/21 0554 11/05/21 1046 11/05/21 1545 11/05/21 2113 11/06/21 0551  GLUCAP 175* 242* 175* 147* 147*     Recent Results (from the past 240 hour(s))  Resp Panel by RT-PCR (Flu A&B, Covid) Nasopharyngeal Swab     Status: None   Collection Time: 10/29/21  8:26 AM   Specimen: Nasopharyngeal Swab; Nasopharyngeal(NP) swabs in vial transport medium  Result Value Ref Range Status   SARS Coronavirus 2 by RT PCR NEGATIVE NEGATIVE Final    Comment: (NOTE) SARS-CoV-2 target nucleic acids are NOT DETECTED.  The SARS-CoV-2 RNA is generally detectable in upper respiratory specimens during the acute phase of infection. The lowest concentration of SARS-CoV-2 viral copies this assay can detect is 138 copies/mL. A negative result does not preclude SARS-Cov-2 infection and should not be used as the sole basis for treatment or other patient management decisions. A negative result may occur with  improper specimen collection/handling, submission of specimen other than nasopharyngeal swab, presence of viral mutation(s) within the areas targeted by this assay, and inadequate number of viral copies(<138 copies/mL). A negative result must be combined with clinical observations, patient history, and epidemiological information. The expected result is Negative.  Fact Sheet for Patients:  BloggerCourse.com  Fact Sheet for Healthcare Providers:  SeriousBroker.it  This  test is no t yet approved or cleared by the [place] FDA and  has been authorized for detection and/or diagnosis of SARS-CoV-2 by FDA under an Emergency Use Authorization (EUA). This EUA will remain  in effect (meaning this test can be used) for the duration of the COVID-19 declaration under Section 564(b)(1) of the Act, 21 U.S.C.section 360bbb-3(b)(1), unless the authorization is terminated  or revoked sooner.       Influenza A by PCR NEGATIVE NEGATIVE Final   Influenza B by PCR NEGATIVE NEGATIVE Final    Comment: (NOTE) The Xpert Xpress SARS-CoV-2/FLU/RSV plus assay is intended as an aid in the diagnosis of influenza from Nasopharyngeal swab specimens and should not be used as a sole basis for treatment. Nasal washings and aspirates are unacceptable for Xpert Xpress SARS-CoV-2/FLU/RSV testing.  Fact Sheet for Patients: BloggerCourse.com  Fact Sheet for Healthcare Providers: SeriousBroker.it  This test is not yet approved or cleared by the [place] FDA and has been authorized for detection and/or diagnosis of  SARS-CoV-2 by FDA under an Emergency Use Authorization (EUA). This EUA will remain in effect (meaning this test can be used) for the duration of the COVID-19 declaration under Section 564(b)(1) of the Act, 21 U.S.C. section 360bbb-3(b)(1), unless the authorization is terminated or revoked.  Performed at [facility], [place] [address] [place], [place] [address]       Scheduled Meds:  docusate sodium  100 mg Oral BID   empagliflozin  10 mg Oral Daily   insulin aspart  0-15 Units Subcutaneous TID WC   insulin aspart  0-5 Units Subcutaneous QHS   insulin glargine-yfgn  15 Units Subcutaneous Daily   metoprolol succinate  100 mg Oral BID   multivitamin with minerals  1 tablet Oral Daily   Ensure Max Protein  11 oz Oral BID   sacubitril-valsartan  1 tablet Oral BID   sodium chloride flush  3 mL Intravenous  Q12H   triamcinolone 0.1 % cream : eucerin   Topical BID   warfarin  6 mg Oral ONCE-1600   Warfarin - Pharmacist Dosing Inpatient   Does not apply q1600   Continuous Infusions:  sodium chloride 20 mL/hr at 11/06/21 0058   amiodarone 30 mg/hr (11/05/21 1755)     LOS: 6 days   Oren Binet, MD [facility] Office  [phone] Pager - Text Page per Shea Evans  If 7PM-7AM, please contact night-coverage per Amion 11/06/2021, 11:26 AM

## 2021-11-06 NOTE — Progress Notes (Addendum)
Advanced Heart Failure Rounding Note  PCP-Cardiologist: None   Subjective:   12/27 diuretics stopped  Scr 1.60 -> 1.68 -> 1.60 -> 1.85->1.85  Down total of 29 lb from admit. Wt stable off lasix.   Remains in AF w/ CVR on amio gtt with rate 60s-70s  On warfarin + heparin gtt. INR 2.2.  Feels ok this morning. No dyspnea/CP.  Awaiting TEE/DCCV today   Objective:   Weight Range: 92.2 kg Body mass index is 30.9 kg/m.   Vital Signs:   Temp:  [97.7 F (36.5 C)-97.8 F (36.6 C)] 97.8 F (36.6 C) (12/29 0557) Pulse Rate:  [68-73] 73 (12/29 0557) Resp:  [18] 18 (12/29 0557) BP: (116-137)/(75-84) 137/84 (12/29 0557) SpO2:  [95 %-96 %] 96 % (12/29 0557) Weight:  [92.2 kg] 92.2 kg (12/29 0557) Last BM Date: 11/03/21  Weight change: Filed Weights   11/04/21 0510 11/05/21 0548 11/06/21 0557  Weight: 93 kg 92 kg 92.2 kg    Intake/Output:   Intake/Output Summary (Last 24 hours) at 11/06/2021 1008 Last data filed at 11/06/2021 0955 Gross per 24 hour  Intake 402.5 ml  Output 858 ml  Net -455.5 ml      Physical Exam   General:  Well appearing. No respiratory difficulty HEENT: normal Neck: supple. no JVD. Carotids 2+ bilat; no bruits. No lymphadenopathy or thyromegaly appreciated. Cor: PMI nondisplaced. Irregularly irregular rhythm and rate. No rubs, gallops or murmurs. Lungs: clear Abdomen: soft, nontender, nondistended. No hepatosplenomegaly. No bruits or masses. Good bowel sounds. Extremities: no cyanosis, clubbing, rash, edema Neuro: alert & oriented x 3, cranial nerves grossly intact. moves all 4 extremities w/o difficulty. Affect pleasant.   Telemetry   Afib/flutter 60s-70s  Labs    CBC Recent Labs    11/05/21 0401 11/06/21 0354  WBC 9.3 8.9  HGB 13.1 12.8*  HCT 42.0 41.8  MCV 88.8 89.9  PLT 213 208   Basic Metabolic Panel Recent Labs    21/30/86 0401 11/06/21 0354  NA 137 133*  K 3.8 4.0  CL 99 98  CO2 27 25  GLUCOSE 140* 142*  BUN  49* 44*  CREATININE 1.85* 1.85*  CALCIUM 8.6* 8.7*   Liver Function Tests No results for input(s): AST, ALT, ALKPHOS, BILITOT, PROT, ALBUMIN in the last 72 hours.  No results for input(s): LIPASE, AMYLASE in the last 72 hours. Cardiac Enzymes No results for input(s): CKTOTAL, CKMB, CKMBINDEX, TROPONINI in the last 72 hours.  BNP: BNP (last 3 results) Recent Labs    10/28/21 2112  BNP 1,165.7*    ProBNP (last 3 results) No results for input(s): PROBNP in the last 8760 hours.   D-Dimer No results for input(s): DDIMER in the last 72 hours. Hemoglobin A1C No results for input(s): HGBA1C in the last 72 hours. Fasting Lipid Panel No results for input(s): CHOL, HDL, LDLCALC, TRIG, CHOLHDL, LDLDIRECT in the last 72 hours. Thyroid Function Tests No results for input(s): TSH, T4TOTAL, T3FREE, THYROIDAB in the last 72 hours.  Invalid input(s): FREET3  Other results:   Imaging    No results found.   Medications:     Scheduled Medications:  docusate sodium  100 mg Oral BID   empagliflozin  10 mg Oral Daily   insulin aspart  0-15 Units Subcutaneous TID WC   insulin aspart  0-5 Units Subcutaneous QHS   insulin glargine-yfgn  15 Units Subcutaneous Daily   metoprolol succinate  100 mg Oral BID   multivitamin with minerals  1  tablet Oral Daily   Ensure Max Protein  11 oz Oral BID   sacubitril-valsartan  1 tablet Oral BID   sodium chloride flush  3 mL Intravenous Q12H   triamcinolone 0.1 % cream : eucerin   Topical BID   Warfarin - Pharmacist Dosing Inpatient   Does not apply q1600    Infusions:  sodium chloride 20 mL/hr at 11/06/21 0058   amiodarone 30 mg/hr (11/05/21 1755)    PRN Medications: acetaminophen **OR** [DISCONTINUED] acetaminophen, bisacodyl, hydrALAZINE, ondansetron **OR** ondansetron (ZOFRAN) IV, polyethylene glycol, traZODone    Patient Profile  Mr Elbaz isa 70 year old with a history of  PAF, severe depression, LV thrombus, DM2, CKD stage III,  obesity, OSA refused sleep study, and chronic systolic HF.  Admitted with volume overload.   Assessment/Plan   1. Acute on chronic systolic HF - Admitted with marked volume overload. Not taking medications as prescribed. - Myoview 12/2015. EF 20% with defects noted on rest and stress images within the inferior wall and to lesser extent the septum suggest ischemic etiology. No reversibility. Refused cath multiple times. No s/s angina currently. HsTrop not checked on admit. ECG with nonspecific inferolateral TW abnormalities - Echo 10/21 EF 55-60% - Echo this admit EF 45-50%, RV moderately reduced, RVSP 85 mmHg - Weight down 29 lb this admit with diuresis. IV lasix stopped 12/27. Scr bumped, 1.6 > 1.85 today.  - Hold diuretics again today  - Continue Entresto 24/26 bid. Copay $45. - Continue farxiga 10 mg daily. Copay $95. Discussed with PharmD. Will switch to Jardiance, copay $45 - Continue toprol xl   2. CKD Stage IIIa-IIIb - Creatinine on admit 1.5-->1.45 ->1.60 -> 1.68 -> 1.60-> 1.85->1.85 today.  - Suspect bump in Cr d/t diuresis - Remain off diuretic for now - Follow BMET   3. DMII - Hgb A1C 8.2. On SSI - Continue Jardiance  4. HTN - Improved with addition of Entresto - BP stable today   5. PAF/? AFL - Back in AF vs AFL with ventricular rates in 60s-70s  - Continue toprol xl for now - On amiodarone gtt at 30/hr - On warfarin, INR 2.2. On heparin gtt. Discussed with PharmD, continue heparin for now in anticipation of DCCV - Plan for TEE/DCCV later this afternoon    6.  H/O LV Thrombus - resolved  7. Hypokalemia - Resolved, K 4.0 today  - Supp PRN    Length of Stay: [address], PA-C  11/06/2021, 10:08 AM  Advanced Heart Failure Team Pager [phone] (M-F; 7a - 5p)  Please contact [facility] for night-coverage after hours (5p -7a ) and weekends on [email or web address]  Patient seen with PA, agree with the above note.   TEE-DCCV done today.  TEE appeared to  show worse EF than TTE, estimate around 35%.  Full study not done as patient became hypoxemic during procedure, requiring removal of probe.  Ultimately, he was cardioverted back to NSR.    General: NAD Neck: No JVD, no thyromegaly or thyroid nodule.  Lungs: Clear to auscultation bilaterally with normal respiratory effort. CV: Nondisplaced PMI.  Heart irregular S1/S2, no S3/S4, no murmur.  No peripheral edema.   Abdomen: Soft, nontender, no hepatosplenomegaly, no distention.  Skin: Intact without lesions or rashes.  Neurologic: Alert and oriented x 3.  Psych: Normal affect. Extremities: No clubbing or cyanosis.  HEENT: Normal.   Continue warfarin, can stop heparin gtt (therapeutic) and convert amiodarone to po.   Hold diuretic with creatinine still  1.85, volume looks ok. Probably restart tomorrow.   Continue current [place], [place].  Will add spironolactone tomorrow if creatinine stable.   With bradycardia post-DCCV, hold Toprol XL for now.   Watch overnight, possible discharge tomorrow.   Loralie Champagne 11/06/2021 2:57 PM

## 2021-11-06 NOTE — Progress Notes (Addendum)
Nutrition Follow-up  DOCUMENTATION CODES:  Obesity unspecified  INTERVENTION:  Continue Ensure Max BID.  Continue MVI with minerals daily.  Encourage PO and supplement intake.  NUTRITION DIAGNOSIS:  Increased nutrient needs related to acute illness (CHF exacerbation) as evidenced by estimated needs. - ongoing  GOAL:  Patient will meet greater than or equal to 90% of their needs. - meeting  MONITOR:  PO intake, Supplement acceptance, Labs, Weight trends, Skin, I & O's  REASON FOR ASSESSMENT:  Consult Assessment of nutrition requirement/status  ASSESSMENT:  70 yo male with a PMH of ADHD; afib; chronic systolic CHF; CKD stage 3a; T2DM; PVD s/p L 5th toe amputation; visual impairment; HTN; and OSA presented with edema, weight gain, and SOB and admitted under hospitalist service with acute on chronic combined CHF without hypoxia as well as had AKI.  He has gained 33 pounds in 1 month 12/29 - cardioversion  RD working remotely. Attempted to call patient's room phone. Pt did not answer.   Pt continues to eat 100% of almost every meal.  Admit wt: 105.4 kg Current wt: 92.2 kg  No edema noted.  Added "Low Sodium Nutrition Therapy" handout from the Academy [employer] to discharge summary for patient use.  Supplements: Ensure Max BID (mostly taking)  Medications: reviewed; colace BID, Jardiance, SSI, Semglee, Warfarin  Labs: reviewed; Na 133 (L), CBG 147-242 (H)  NUTRITION - FOCUSED PHYSICAL EXAM: Unable to perform - defer to in-person  Diet Order:   Diet Order             Diet heart healthy/carb modified Room service appropriate? Yes; Fluid consistency: Thin  Diet effective now                  EDUCATION NEEDS:  No education needs have been identified at this time  Skin:  Skin Assessment: Skin Integrity Issues: Skin Integrity Issues:: Stage I Stage I: R buttocks  Last BM:  11/05/21 - per pt report  Height:  Ht Readings from Last 1  Encounters:  11/06/21 5\' 8"  (1.727 m)   Weight:  Wt Readings from Last 1 Encounters:  11/06/21 92.2 kg   BMI:  Body mass index is 30.91 kg/m.  Estimated Nutritional Needs:  Kcal:  2100-2300 Protein:  110-125 grams Fluid:  1.5 L fluid restriction per MD  11/08/21, RD, LDN (she/her/hers) Clinical Inpatient Dietitian RD Pager/After-Hours/Weekend Pager # in [place]

## 2021-11-06 NOTE — Progress Notes (Signed)
ANTICOAGULATION CONSULT NOTE - Follow Up Consult  Pharmacy Consult for warfarin  Indication: atrial fibrillation  No Known Allergies  Patient Measurements: Height: 5\' 8"  (172.7 cm) Weight: 92.2 kg (203 lb 3.2 oz) (scale a) IBW/kg (Calculated) : 68.4  Vital Signs: Temp: 97.8 F (36.6 C) (12/29 0557) Temp Source: Oral (12/29 0557) BP: 137/84 (12/29 0557) Pulse Rate: 73 (12/29 0557)  Labs: Recent Labs    11/04/21 0429 11/04/21 1904 11/05/21 0401 11/05/21 0856 11/05/21 2153 11/06/21 0354  HGB  --   --  13.1  --   --  12.8*  HCT  --   --  42.0  --   --  41.8  PLT  --   --  213  --   --  208  LABPROT 16.2*  --  23.2* 24.1* 24.9* 24.7*  INR 1.3*  --  2.1* 2.2* 2.3* 2.2*  HEPARINUNFRC  --  <0.10* 0.26*  --   --   --   CREATININE 1.60*  --  1.85*  --   --  1.85*     Estimated Creatinine Clearance: 40.4 mL/min (A) (by C-G formula based on SCr of 1.85 mg/dL (H)).   Assessment: 70 yo male admitted for acute on chronic combined systolic and diastolic CHF. He has a history of a fib and was on warfarin prior to admission.  PTA dose: 3mg  qTues/Sat, 6mg  all other days, Admit INR 1.9  INR remains therapeutic at 2.2. Hgb 12.8, plt 208. Oral intake documented at 100%. Plan for TEE/DCCV today.   Goal of Therapy:  INR 2-3 Monitor platelets by anticoagulation protocol: Yes   Plan:  Order warfarin 6 mg tonight Monitor for s/sx of bleeding   Thank you for allowing pharmacy to participate in this patient's care.  62, PharmD, BCCCP Clinical Pharmacist  Phone: [phone] 11/06/2021 11:00 AM  Please check AMION for all [facility] Pharmacy phone numbers After 10:00 PM, call Main Pharmacy [phone]

## 2021-11-06 NOTE — Progress Notes (Addendum)
PT Cancellation Note  Patient Details Name: [name] MRN: [id number] DOB: January 26, 1951   Cancelled Treatment:    Reason Eval/Treat Not Completed: Medical issues which prohibited therapy.  On heparin, amiodarone drip, awaiting his TEE.  Retry at another time.   Ivar Drape 11/06/2021, 11:33 AM  Samul Dada, PT PhD Acute Rehab Dept. Number: [facility] [id number] and [facility] [phone]

## 2021-11-06 NOTE — Progress Notes (Signed)
Pt returned from TEE/CARDIOVERSION with HR in 30-40's.  RN Tia came in to see pt and assess, amiodarone drip stopped (orders to start po amio)  pt A & O x4, hungry wanting food. HR 40-50 per other RN.   Central monitoring called again with HR 40-42, SB with possible junctional rhythm.   Pt awake, oriented.   Message sent to Dr Shirlee Latch of the above. He will be d/cing the amio and metoprolol.   Ok with HR,  as long as BP okay.   BP 89/53 with recheck of 101/52.    Will continue to monitor.

## 2021-11-06 NOTE — Anesthesia Postprocedure Evaluation (Signed)
Anesthesia Post Note  Patient: [name]  Procedure(s) Performed: TRANSESOPHAGEAL ECHOCARDIOGRAM (TEE) CARDIOVERSION     Patient location during evaluation: Endoscopy Anesthesia Type: General Level of consciousness: awake and alert Pain management: pain level controlled Vital Signs Assessment: post-procedure vital signs reviewed and stable Respiratory status: spontaneous breathing, nonlabored ventilation, respiratory function stable and patient connected to nasal cannula oxygen Cardiovascular status: blood pressure returned to baseline and stable Postop Assessment: no apparent nausea or vomiting Anesthetic complications: no   No notable events documented.  Last Vitals:  Vitals:   11/06/21 1616 11/06/21 1647  BP: (!) 101/52   Pulse:    Resp:  18  Temp:    SpO2:      Last Pain:  Vitals:   11/06/21 1520  TempSrc:   PainSc: 0-No pain                 Cecile Hearing

## 2021-11-06 NOTE — CV Procedure (Signed)
Procedure: TEE  Sedation: Per anesthesiology  Indication: Atrial fibrillation  Findings: Please see echo section for full report.  We were unable to do a full study due to hypoxemia requiring removal of the probe early.  Normal LV size with mild LV hypertrophy.  EF 35%, diffuse hypokinesis.  Normal RV size with mildly decreased systolic function.  Trivial TR, no complete TR doppler jet to measure PA systolic pressure.  Trivial mitral regurgitation.  Trileaflet aortic valve with no significant stenosis or regurgitation.  Moderate left atrial enlargement with no LA appendage thrombus though there was smoke in the LA.  Mild right atrial enlargement.  No ASD or PFO by color doppler.  The aorta was not visualized.    Impression: No LA appendage thrombus, may proceed to DCCV.   [name] 11/06/2021 2:46 PM

## 2021-11-06 NOTE — Procedures (Signed)
Electrical Cardioversion Procedure Note [name] [id number] 07-31-1951  Procedure: Electrical Cardioversion Indications:  Atrial Fibrillation  Procedure Details Consent: Risks of procedure as well as the alternatives and risks of each were explained to the (patient/caregiver).  Consent for procedure obtained. Time Out: Verified patient identification, verified procedure, site/side was marked, verified correct patient position, special equipment/implants available, medications/allergies/relevent history reviewed, required imaging and test results available.  Performed  Patient placed on cardiac monitor, pulse oximetry, supplemental oxygen as necessary.  Sedation given:  Propofol per anesthesiology Pacer pads placed anterior and posterior chest.  Cardioverted 1 time(s).  Cardioverted at 200J.  Evaluation Findings: Post procedure EKG shows: NSR Complications: None Patient did tolerate procedure well.   [name] 11/06/2021, 2:47 PM

## 2021-11-06 NOTE — Care Management Important Message (Signed)
Important Message  Patient Details  Name: KAZIMIR HARTNETT MRN: [id number] Date of Birth: [birth year]   Medicare Important Message Given:  Yes     Dorena Bodo 11/06/2021, 3:22 PM

## 2021-11-07 DIAGNOSIS — I5043 Acute on chronic combined systolic (congestive) and diastolic (congestive) heart failure: Secondary | ICD-10-CM | POA: Diagnosis not present

## 2021-11-07 LAB — BASIC METABOLIC PANEL
Anion gap: 9 (ref 5–15)
BUN: 46 mg/dL — ABNORMAL HIGH (ref 8–23)
CO2: 26 mmol/L (ref 22–32)
Calcium: 8.9 mg/dL (ref 8.9–10.3)
Chloride: 100 mmol/L (ref 98–111)
Creatinine, Ser: 1.99 mg/dL — ABNORMAL HIGH (ref 0.61–1.24)
GFR, Estimated: 35 mL/min — ABNORMAL LOW (ref >60)
Glucose, Bld: 107 mg/dL — ABNORMAL HIGH (ref 70–99)
Potassium: 4.5 mmol/L (ref 3.5–5.1)
Sodium: 135 mmol/L (ref 135–145)

## 2021-11-07 LAB — URINALYSIS, ROUTINE W REFLEX MICROSCOPIC
Bacteria, UA: NONE SEEN
Bilirubin Urine: NEGATIVE
Glucose, UA: >=500 mg/dL — AB
Hgb urine dipstick: NEGATIVE
Ketones, ur: NEGATIVE mg/dL
Leukocytes,Ua: NEGATIVE
Nitrite: NEGATIVE
Protein, ur: NEGATIVE mg/dL
Specific Gravity, Urine: 1.013 (ref 1.005–1.030)
pH: 6 (ref 5.0–8.0)

## 2021-11-07 LAB — LACTIC ACID, PLASMA
Lactic Acid, Venous: 1 mmol/L (ref 0.5–1.9)
Lactic Acid, Venous: 1.4 mmol/L (ref 0.5–1.9)

## 2021-11-07 LAB — CBC
HCT: 39.4 % (ref 39.0–52.0)
Hemoglobin: 11.8 g/dL — ABNORMAL LOW (ref 13.0–17.0)
MCH: 27.2 pg (ref 26.0–34.0)
MCHC: 29.9 g/dL — ABNORMAL LOW (ref 30.0–36.0)
MCV: 90.8 fL (ref 80.0–100.0)
Platelets: 200 10*3/uL (ref 150–400)
RBC: 4.34 MIL/uL (ref 4.22–5.81)
RDW: 16.3 % — ABNORMAL HIGH (ref 11.5–15.5)
WBC: 11.4 10*3/uL — ABNORMAL HIGH (ref 4.0–10.5)
nRBC: 0 % (ref 0.0–0.2)

## 2021-11-07 LAB — GLUCOSE, CAPILLARY
Glucose-Capillary: 110 mg/dL — ABNORMAL HIGH (ref 70–99)
Glucose-Capillary: 162 mg/dL — ABNORMAL HIGH (ref 70–99)
Glucose-Capillary: 145 mg/dL — ABNORMAL HIGH (ref 70–99)

## 2021-11-07 LAB — PROTIME-INR
INR: 2.2 — ABNORMAL HIGH (ref 0.8–1.2)
Prothrombin Time: 24.5 seconds — ABNORMAL HIGH (ref 11.4–15.2)

## 2021-11-07 LAB — PROCALCITONIN: Procalcitonin: 0.1 ng/mL

## 2021-11-07 MED ORDER — SACUBITRIL-VALSARTAN 24-26 MG PO TABS
1.0000 | ORAL_TABLET | Freq: Two times a day (BID) | ORAL | Status: DC
  Administered 2021-11-07 – 2021-11-08 (×2): 1 via ORAL
  Filled 2021-11-07 (×2): qty 1

## 2021-11-07 MED ORDER — SACUBITRIL-VALSARTAN 24-26 MG PO TABS: 1.0000 | ORAL_TABLET | Freq: Two times a day (BID) | ORAL | Status: DC

## 2021-11-07 MED ORDER — WARFARIN SODIUM 3 MG PO TABS
6.0000 mg | ORAL_TABLET | Freq: Once | ORAL | Status: AC
  Administered 2021-11-07: 17:00:00 6 mg via ORAL
  Filled 2021-11-07: qty 2

## 2021-11-07 NOTE — Plan of Care (Signed)
Problem: Clinical Measurements: °Goal: Will remain free from infection °Outcome: Completed/Met °  °

## 2021-11-07 NOTE — Progress Notes (Addendum)
I was made aware of episodes of bradycardia on telemetry. Has been bradycardic following DCCV.  Overnight sinus brady 40s-50.  Pauses up to 2.5 seconds and junctional bradycardia noted.   Rhythm currently sinus bradycardia in 50s. This am episodes of bradycardia with rates down to 30s and pauses up to 2.25 seconds.   Patient asymptomatic.   Metoprolol discontinued 12/29 and Amiodarone gtt stopped 12/28.  Continue to monitor.

## 2021-11-07 NOTE — Progress Notes (Signed)
°   11/06/21 2204  Assess: MEWS Score  BP (!) 124/54  Pulse Rate (!) 39  Resp 18  SpO2 97 %  O2 Device Room Air  Assess: MEWS Score  MEWS Temp 0  MEWS Systolic 0  MEWS Pulse 2  MEWS RR 0  MEWS LOC 0  MEWS Score 2  MEWS Score Color Yellow  Assess: if the MEWS score is Yellow or Red  Were vital signs taken at a resting state? No  Focused Assessment No change from prior assessment  Early Detection of Sepsis Score *See Row Information* Low  MEWS guidelines implemented *See Row Information* No, other (Comment) (pt has had low HR since pt had a cardioversion today)  Document  Patient Outcome Stabilized after interventions  Progress note created (see row info) Yes

## 2021-11-07 NOTE — Progress Notes (Signed)
°   11/07/21 0100  Assess: MEWS Score  BP (!) 122/51  Pulse Rate (!) 38  Resp 18  SpO2 97 %  O2 Device Room Air

## 2021-11-07 NOTE — Plan of Care (Signed)
  Problem: Activity: Goal: Capacity to carry out activities will improve Outcome: Progressing   Problem: Cardiac: Goal: Ability to achieve and maintain adequate cardiopulmonary perfusion will improve Outcome: Progressing   Problem: Clinical Measurements: Goal: Respiratory complications will improve Outcome: Progressing   

## 2021-11-07 NOTE — Progress Notes (Signed)
ANTICOAGULATION CONSULT NOTE - Follow Up Consult  Pharmacy Consult for warfarin  Indication: atrial fibrillation  No Known Allergies  Patient Measurements: Height: 5\' 8"  (172.7 cm) Weight: 94.1 kg (207 lb 8 oz) IBW/kg (Calculated) : 68.4  Vital Signs: Temp: 98.1 F (36.7 C) (12/30 1126) Temp Source: Oral (12/30 1126) BP: 143/70 (12/30 1126) Pulse Rate: 49 (12/30 1126)  Labs: Recent Labs     0000 11/04/21 1904 11/05/21 0401 11/05/21 0856 11/05/21 2153 11/06/21 0354 11/07/21 0329  HGB   < >  --  13.1  --   --  12.8* 11.8*  HCT  --   --  42.0  --   --  41.8 39.4  PLT  --   --  213  --   --  208 200  LABPROT  --   --  23.2*   < > 24.9* 24.7* 24.5*  INR  --   --  2.1*   < > 2.3* 2.2* 2.2*  HEPARINUNFRC  --  <0.10* 0.26*  --   --   --   --   CREATININE  --   --  1.85*  --   --  1.85* 1.99*   < > = values in this interval not displayed.     Estimated Creatinine Clearance: 37.9 mL/min (A) (by C-G formula based on SCr of 1.99 mg/dL (H)).   Assessment: 70 yo male admitted for acute on chronic combined systolic and diastolic CHF. He has a history of a fib and was on warfarin prior to admission.  PTA dose: 3mg  qTues/Sat, 6mg  all other days, Admit INR 1.9  INR is therapeutic at 2.2. Hgb 11.8, plt 200. Oral intake documented at 100%. S/p successful TEE/DCCV yesterday.   Goal of Therapy:  INR 2-3 Monitor platelets by anticoagulation protocol: Yes   Plan:  Order warfarin 6 mg tonight Monitor for s/sx of bleeding   Thank you for allowing pharmacy to participate in this patient's care.  62, PharmD, BCCCP Clinical Pharmacist  Phone: [phone] 11/07/2021 1:32 PM  Please check AMION for all [facility] Pharmacy phone numbers After 10:00 PM, call Main Pharmacy [phone]

## 2021-11-07 NOTE — Progress Notes (Signed)
Physical Therapy Treatment Patient Details Name: [name] MRN: [id number] DOB: 1951-04-18 Today's Date: 11/07/2021   History of Present Illness Pt is a 70 y.o. male admitted 10/28/21 with edema, weight gain, SOB. Successfully cardioverted 12/29 Workup for CHF exacerbation, AKI. PMH includes CHF, HTN, CKD, DM; admission 05/2021 for LLE vascular intervention.    PT Comments    Pt with episode of bradycardia earlier this morning, vitals stable with ambulation today. Pt particularly verbose today about all manners of medical conspiracies. Pt ambulates at a supervision level with SPC. D/c plan remains appropriate.    Recommendations for follow up therapy are one component of a multi-disciplinary discharge planning process, led by the attending physician.  Recommendations may be updated based on patient status, additional functional criteria and insurance authorization.  Follow Up Recommendations  No PT follow up     Assistance Recommended at Discharge Intermittent Supervision/Assistance  Equipment Recommendations  None recommended by PT       Precautions / Restrictions Precautions Precautions: Fall Restrictions Weight Bearing Restrictions: No     Mobility  Bed Mobility               General bed mobility comments: Pt sitting up on EoB on entry    Transfers Overall transfer level: Needs assistance Equipment used: Straight cane Transfers: Sit to/from Stand Sit to Stand: Min guard           General transfer comment: min guard for power up to [facility], R knee extended for power up    Ambulation/Gait Ambulation/Gait assistance: Supervision Gait Distance (Feet): 100 Feet Assistive device: Straight cane;Rolling walker (2 wheels) Gait Pattern/deviations: Step-through pattern;Decreased stride length;Trunk flexed Gait velocity: reduced Gait velocity interpretation: <1.31 ft/sec, indicative of household ambulator   General Gait Details: slow, steady gait           Balance Overall balance assessment: Needs assistance   Sitting balance-Leahy Scale: Good     Standing balance support: No upper extremity supported;Single extremity supported;During functional activity Standing balance-Leahy Scale: Fair Standing balance comment: can static stand and take steps without UE support; preference for [facility]                            Cognition Arousal/Alertness: Awake/alert Behavior During Therapy: WFL for tasks assessed/performed Overall Cognitive Status: No family/caregiver present to determine baseline cognitive functioning                                 General Comments: Tangential and verbose with speech, unrelated topics, requires frequent redirection to conversation        Exercises Other Exercises Other Exercises: hip extension x 10    General Comments  Pt with episode of bradycardia 39bpm earlier this morning. Reviewed by heart failure team prior to ambulation. HR with ambulation 49-83 bpm, per tele monitor pt with increased 4-6 PVCs.       Pertinent Vitals/Pain Pain Assessment: No/denies pain     PT Goals (current goals can now be found in the care plan section) Acute Rehab PT Goals Patient Stated Goal: to speak with a Child psychotherapist PT Goal Formulation: With patient Time For Goal Achievement: 11/12/21 Potential to Achieve Goals: Good Progress towards PT goals: Progressing toward goals    Frequency    Min 3X/week      PT Plan Current plan remains appropriate       AM-PAC PT "  6 Clicks" Mobility   Outcome Measure  Help needed turning from your back to your side while in a flat bed without using bedrails?: A Little Help needed moving from lying on your back to sitting on the side of a flat bed without using bedrails?: A Little Help needed moving to and from a bed to a chair (including a wheelchair)?: A Little Help needed standing up from a chair using your arms (e.g., wheelchair or bedside  chair)?: A Little Help needed to walk in hospital room?: A Little Help needed climbing 3-5 steps with a railing? : A Little 6 Click Score: 18    End of Session Equipment Utilized During Treatment: Gait belt Activity Tolerance: Patient tolerated treatment well Patient left: in chair;with call bell/phone within reach;with chair alarm set Nurse Communication: Mobility status PT Visit Diagnosis: Other abnormalities of gait and mobility (R26.89);Unsteadiness on feet (R26.81)     Time: 4097-3532 PT Time Calculation (min) (ACUTE ONLY): 22 min  Charges:  $Therapeutic Exercise: 8-22 mins                     Lindee Leason B. Beverely Risen PT, DPT Acute Rehabilitation Services Pager [phone] Office [phone]    Elon Alas Fleet 11/07/2021, 12:51 PM

## 2021-11-07 NOTE — Progress Notes (Signed)
[name]  [id number] DOB: 1951-07-29 DOA: 10/28/2021 PCP: Sandrea Hughs, NP    Brief Narrative:  70 year old with a history of ADHD, chronic atrial fibrillation on Coumadin, chronic systolic CHF, CKD stage IIIa, DM2, peripheral vascular disease status post left fifth toe amputation, visual impairment, HTN, and OSA who presented to the ED with shortness of breath, peripheral edema, weight gain (33 pounds in 1 month),-found to have acute on chronic systolic heart failure.  Significant Events:  12/20>> admit for decompensated heart failure  Consultants:  Advanced CHF team  Code Status:  FULL CODE  Antimicrobials:  None  DVT prophylaxis: Warfarin  Subjective: Some mild dizziness when he sat up but resolved.  No chest pain or shortness of breath.  Objective: Vitals: Blood pressure 140/72, pulse (!) 52, temperature 98.2 F (36.8 C), resp. rate 18, height 5\' 8"  (1.727 m), weight 94.1 kg, SpO2 100 %.   Gen Exam:Alert awake-not in any distress HEENT:atraumatic, normocephalic Chest: B/L clear to auscultation anteriorly CVS:S1S2 regular Abdomen:soft non tender, non distended Extremities:no edema Neurology: Non focal Skin: no rash   Assessment & Plan: Acute on chronic combined systolic and diastolic CHF (echo on [id number] EF 45-50% with grade 2 diastolic dysfunction): Volume status stable-diuretics/Entresto on hold due to mild AKI.  CHF team following-on Jardiance and beta-blocker.  PAF paroxysmal atrial flutter s/p cardioversion on 12/29: Maintaining sinus rhythm-no longer on amiodarone infusion-INR therapeutic.   AKI on CKD stage IIIa: Creatinine has worsened overnight-discussed with CHF team-hold plans for discharge-repeat electrolytes tomorrow.  Entresto discontinued-no longer on diuretics.    Recent Labs  Lab 11/03/21 0737 11/04/21 0429 11/05/21 0401 11/06/21 0354 11/07/21 0329  CREATININE 1.60* 1.60* 1.85* 1.85* 1.99*   Essential HTN: BP stable-continue   metoprolol.  DM2 (A1c 8.2 on 06/13/2021): CBG stable-continue Semglee 15 units daily/SSI and Jardiance.  Recent Labs    11/06/21 1629 11/06/21 2100 11/07/21 0559  GLUCAP 136* 201* 110*     Recent Labs  Lab 11/03/21 0737 11/04/21 0429 11/05/21 0401 11/06/21 0354 11/07/21 0329  K 3.9 3.4* 3.8 4.0 4.5   PVD s/p L 5th toe amputation  Family Communication: No family present at time of exam  Barriers to discharge: Worsening AKI  Status is: Inpatient  CBC: Recent Labs  Lab 11/05/21 0401 11/06/21 0354 11/07/21 0329  WBC 9.3 8.9 11.4*  HGB 13.1 12.8* 11.8*  HCT 42.0 41.8 39.4  MCV 88.8 89.9 90.8  PLT 213 208 [id number]    Basic Metabolic Panel: Recent Labs  Lab 11/05/21 0401 11/06/21 0354 11/07/21 0329  NA 137 133* 135  K 3.8 4.0 4.5  CL 99 98 100  CO2 27 25 26   GLUCOSE 140* 142* 107*  BUN 49* 44* 46*  CREATININE 1.85* 1.85* 1.99*  CALCIUM 8.6* 8.7* 8.9    GFR: Estimated Creatinine Clearance: 37.9 mL/min (A) (by C-G formula based on SCr of 1.99 mg/dL (H)).  Liver Function Tests: No results for input(s): AST, ALT, ALKPHOS, BILITOT, PROT, ALBUMIN in the last 168 hours. No results for input(s): LIPASE, AMYLASE in the last 168 hours. No results for input(s): AMMONIA in the last 168 hours.  Coagulation Profile: Recent Labs  Lab 11/05/21 0401 11/05/21 0856 11/05/21 2153 11/06/21 0354 11/07/21 0329  INR 2.1* 2.2* 2.3* 2.2* 2.2*     HbA1C: Hgb A1c MFr Bld  Date/Time Value Ref Range Status  06/13/2021 09:41 AM 8.2 (H) <5.7 % of total Hgb Final    Comment:    For someone without known  diabetes, a hemoglobin A1c value of 6.5% or greater indicates that they may have  diabetes and this should be confirmed with a follow-up  test. . For someone with known diabetes, a value <7% indicates  that their diabetes is well controlled and a value  greater than or equal to 7% indicates suboptimal  control. A1c targets should be individualized based on  duration of  diabetes, age, comorbid conditions, and  other considerations. . Currently, no consensus exists regarding use of hemoglobin A1c for diagnosis of diabetes for children. Marland Kitchen   05/22/2021 12:40 AM 10.4 (H) 4.8 - 5.6 % Final    Comment:    (NOTE) Pre diabetes:          5.7%-6.4%  Diabetes:              >6.4%  Glycemic control for   <7.0% adults with diabetes     CBG: Recent Labs  Lab 11/06/21 0551 11/06/21 1142 11/06/21 1629 11/06/21 2100 11/07/21 0559  GLUCAP 147* 172* 136* 201* 110*     Recent Results (from the past 240 hour(s))  Resp Panel by RT-PCR (Flu A&B, Covid) Nasopharyngeal Swab     Status: None   Collection Time: 10/29/21  8:26 AM   Specimen: Nasopharyngeal Swab; Nasopharyngeal(NP) swabs in vial transport medium  Result Value Ref Range Status   SARS Coronavirus 2 by RT PCR NEGATIVE NEGATIVE Final    Comment: (NOTE) SARS-CoV-2 target nucleic acids are NOT DETECTED.  The SARS-CoV-2 RNA is generally detectable in upper respiratory specimens during the acute phase of infection. The lowest concentration of SARS-CoV-2 viral copies this assay can detect is 138 copies/mL. A negative result does not preclude SARS-Cov-2 infection and should not be used as the sole basis for treatment or other patient management decisions. A negative result may occur with  improper specimen collection/handling, submission of specimen other than nasopharyngeal swab, presence of viral mutation(s) within the areas targeted by this assay, and inadequate number of viral copies(<138 copies/mL). A negative result must be combined with clinical observations, patient history, and epidemiological information. The expected result is Negative.  Fact Sheet for Patients:  BloggerCourse.com  Fact Sheet for Healthcare Providers:  SeriousBroker.it  This test is no t yet approved or cleared by the [place] FDA and  has been authorized for  detection and/or diagnosis of SARS-CoV-2 by FDA under an Emergency Use Authorization (EUA). This EUA will remain  in effect (meaning this test can be used) for the duration of the COVID-19 declaration under Section 564(b)(1) of the Act, 21 U.S.C.section 360bbb-3(b)(1), unless the authorization is terminated  or revoked sooner.       Influenza A by PCR NEGATIVE NEGATIVE Final   Influenza B by PCR NEGATIVE NEGATIVE Final    Comment: (NOTE) The Xpert Xpress SARS-CoV-2/FLU/RSV plus assay is intended as an aid in the diagnosis of influenza from Nasopharyngeal swab specimens and should not be used as a sole basis for treatment. Nasal washings and aspirates are unacceptable for Xpert Xpress SARS-CoV-2/FLU/RSV testing.  Fact Sheet for Patients: BloggerCourse.com  Fact Sheet for Healthcare Providers: SeriousBroker.it  This test is not yet approved or cleared by the [place] FDA and has been authorized for detection and/or diagnosis of SARS-CoV-2 by FDA under an Emergency Use Authorization (EUA). This EUA will remain in effect (meaning this test can be used) for the duration of the COVID-19 declaration under Section 564(b)(1) of the Act, 21 U.S.C. section 360bbb-3(b)(1), unless the authorization is terminated or revoked.  Performed at [facility], [place] [address] [place], [place] [address]       Scheduled Meds:  docusate sodium  100 mg Oral BID   empagliflozin  10 mg Oral Daily   insulin aspart  0-15 Units Subcutaneous TID WC   insulin aspart  0-5 Units Subcutaneous QHS   insulin glargine-yfgn  15 Units Subcutaneous Daily   multivitamin with minerals  1 tablet Oral Daily   Ensure Max Protein  11 oz Oral BID   [START ON 11/08/2021] sacubitril-valsartan  1 tablet Oral BID   sodium chloride flush  3 mL Intravenous Q12H   triamcinolone 0.1 % cream : eucerin   Topical BID   Warfarin - Pharmacist Dosing Inpatient   Does not  apply q1600   Continuous Infusions:     LOS: 7 days   Oren Binet, MD [facility] Office  [phone] Pager - Text Page per Shea Evans  If 7PM-7AM, please contact night-coverage per Amion 11/07/2021, 10:26 AM

## 2021-11-07 NOTE — Progress Notes (Signed)
CCMD notified this RN of a bradycardic episode down to HR of 29. Patient assessed and denies any symptoms. Heart failure team notified.

## 2021-11-07 NOTE — Progress Notes (Signed)
Mobility Specialist Progress Note:   11/07/21 1000  Mobility  Activity Stood at bedside;Dangled on edge of bed  Level of Assistance Standby assist, set-up cues, supervision of patient - no hands on  Assistive Device Front wheel walker  Minutes Stood 5 minutes  Mobility Response Tolerated well  Mobility performed by Mobility specialist  $Mobility charge 1 Mobility   Orthostatic vitals Laying: 130/73 Sitting:129/66 Standing:133/62  Pt received in bed. Orthostatic vitals then back to bed per RN request.   [name] [name] Mobility Specialist Primary Phone [phone] Secondary Phone [phone]

## 2021-11-07 NOTE — Progress Notes (Addendum)
Advanced Heart Failure Rounding Note  PCP-Cardiologist: None   Subjective:    TEE-DCCV done yesterday.  TEE appeared to show worse EF than TTE, estimate around 35%.  Full study not done as patient became hypoxemic during procedure, requiring removal of probe.  Ultimately, he was cardioverted back to NSR.  Had bradycardia post-DCCV, Toprol XL held.   Has been off diuretics since 12/27 for low volume status and AKI after 30 lb diuresis.    No further Afib. Sinus brady, upper 40s-low 50s w/ transient junctional bradycardia overnight and few pauses ~2.5 sec. Gets dizzy if he sits up too quickly but otherwise asymptomatic. BP normotensive, 120s-130s systolic.   Off amio gtt and ? blocker  K 4.5   Remains off diuretics. Wt up 4 lb.  SCr trending up, 1.85>>1.99.   WBC up 8.9>>11.4. AF    Denies dyspnea. No CP  Objective:   Weight Range: 94.1 kg Body mass index is 31.55 kg/m.   Vital Signs:   Temp:  [97.4 F (36.3 C)-98.6 F (37 C)] 98.2 F (36.8 C) (12/30 0400) Pulse Rate:  [38-62] 52 (12/30 0600) Resp:  [11-20] 18 (12/30 0400) BP: (89-143)/(51-81) 140/72 (12/30 0600) SpO2:  [94 %-100 %] 100 % (12/30 0400) Weight:  [92.2 kg-94.1 kg] 94.1 kg (12/30 0400) Last BM Date: 11/05/21  Weight change: Filed Weights   11/06/21 0557 11/06/21 1329 11/07/21 0400  Weight: 92.2 kg 92.2 kg 94.1 kg    Intake/Output:   Intake/Output Summary (Last 24 hours) at 11/07/2021 0846 Last data filed at 11/07/2021 0806 Gross per 24 hour  Intake 718 ml  Output 1525 ml  Net -807 ml      Physical Exam   PHYSICAL EXAM: General:  Well appearing. No respiratory difficulty HEENT: normal Neck: supple. no JVD. Carotids 2+ bilat; no bruits. No lymphadenopathy or thyromegaly appreciated. Cor: PMI nondisplaced. Regular rhythm, slow rate. No rubs, gallops or murmurs. Lungs: clear Abdomen: soft, nontender, nondistended. No hepatosplenomegaly. No bruits or masses. Good bowel  sounds. Extremities: no cyanosis, clubbing, rash, edema Neuro: alert & oriented x 3, cranial nerves grossly intact. moves all 4 extremities w/o difficulty. Affect pleasant.   Telemetry   Sinus brady, upper 40s-50s, brief pauses overnight ~2.5 sec + junctional bradycardia. No further Afib  Labs    CBC Recent Labs    11/06/21 0354 11/07/21 0329  WBC 8.9 11.4*  HGB 12.8* 11.8*  HCT 41.8 39.4  MCV 89.9 90.8  PLT 208 200   Basic Metabolic Panel Recent Labs    25/36/64 0354 11/07/21 0329  NA 133* 135  K 4.0 4.5  CL 98 100  CO2 25 26  GLUCOSE 142* 107*  BUN 44* 46*  CREATININE 1.85* 1.99*  CALCIUM 8.7* 8.9   Liver Function Tests No results for input(s): AST, ALT, ALKPHOS, BILITOT, PROT, ALBUMIN in the last 72 hours.  No results for input(s): LIPASE, AMYLASE in the last 72 hours. Cardiac Enzymes No results for input(s): CKTOTAL, CKMB, CKMBINDEX, TROPONINI in the last 72 hours.  BNP: BNP (last 3 results) Recent Labs    10/28/21 2112  BNP 1,165.7*    ProBNP (last 3 results) No results for input(s): PROBNP in the last 8760 hours.   D-Dimer No results for input(s): DDIMER in the last 72 hours. Hemoglobin A1C No results for input(s): HGBA1C in the last 72 hours. Fasting Lipid Panel No results for input(s): CHOL, HDL, LDLCALC, TRIG, CHOLHDL, LDLDIRECT in the last 72 hours. Thyroid Function Tests No results  for input(s): TSH, T4TOTAL, T3FREE, THYROIDAB in the last 72 hours.  Invalid input(s): FREET3  Other results:   Imaging    No results found.   Medications:     Scheduled Medications:  docusate sodium  100 mg Oral BID   empagliflozin  10 mg Oral Daily   insulin aspart  0-15 Units Subcutaneous TID WC   insulin aspart  0-5 Units Subcutaneous QHS   insulin glargine-yfgn  15 Units Subcutaneous Daily   multivitamin with minerals  1 tablet Oral Daily   Ensure Max Protein  11 oz Oral BID   sacubitril-valsartan  1 tablet Oral BID   sodium chloride  flush  3 mL Intravenous Q12H   triamcinolone 0.1 % cream : eucerin   Topical BID   Warfarin - Pharmacist Dosing Inpatient   Does not apply q1600    Infusions:    PRN Medications: acetaminophen **OR** [DISCONTINUED] acetaminophen, bisacodyl, hydrALAZINE, ondansetron **OR** ondansetron (ZOFRAN) IV, polyethylene glycol, traZODone    Patient Profile  [name] isa 70 year old with a history of  PAF, severe depression, LV thrombus, DM2, CKD stage III, obesity, OSA refused sleep study, and chronic systolic HF.  Admitted with volume overload.   Assessment/Plan   1. Acute on chronic systolic HF - Admitted with marked volume overload. Not taking medications as prescribed. - Myoview 12/2015. EF 20% with defects noted on rest and stress images within the inferior wall and to lesser extent the septum suggest ischemic etiology. No reversibility. Refused cath multiple times. No s/s angina currently. HsTrop not checked on admit. ECG with nonspecific inferolateral TW abnormalities - Echo 10/21 EF 55-60% - Echo this admit EF 45-50%, RV moderately reduced, RVSP 85 mmHg - He diuresed over 30 lb w/ IV Lasix. Diuretics stopped 12/27. Scr bumped, 1.6 > 1.85>>1.99 today.  - Hold diuretics again today  - Hold Entresto in the morning w/ bump in SCr, resume in pm.  - Continue Jardiance 10 mg  - Continue to hold toprol xl given bradycardia  - May need placement of PICC to check Co-ox and follow CVPs    2. CKD Stage IIIa-IIIb - Creatinine on admit 1.5-->1.45 ->1.60 -> 1.68 -> 1.60-> 1.85->1.85->1.99 today.  - Suspect bump in Cr d/t diuresis + bradycardia may now be contributing  - Remain off diuretic for now.  - Hold Entresto today  - May need placement of PICC to check Co-ox and follow CVPs - Follow BMET   3. DMII - Hgb A1C 8.2. On SSI - Continue Jardiance  4. HTN - Improved, BP stable today   5. PAF/? AFL - s/p DCCV 12/29>>Sinus brady  - maintaining SR/ SB - off amio and ? blocker w/  bradycardia  - On warfarin, INR 2.2.  - Keep K > 4.0 and Mg >2.0    6.  H/O LV Thrombus - resolved - on warfrain for Afib/flutter   7. Hypokalemia - Resolved, K 4.5 today  - Supp PRN   8. Leukocytosis  - WBC 8.9>>11.4. AF - check UA and PCT   Length of Stay: [id number] PA-C  11/07/2021, 8:46 AM  Advanced Heart Failure Team Pager [phone] (M-F; 7a - 5p)  Please contact [facility] for night-coverage after hours (5p -7a ) and weekends on [email or web address]  Patient seen with PA, agree with the above note.   TEE-guided DCCV yesterday.  He as bradycardic in 40s generally overnight, now up to 50s.  BP stable.  Creatinine mildly higher at 1.99.  General: NAD Neck: No JVD, no thyromegaly or thyroid nodule.  Lungs: Clear to auscultation bilaterally with normal respiratory effort. CV: Nondisplaced PMI.  Heart brady, regular S1/S2, no S3/S4, no murmur.  No peripheral edema.   Abdomen: Soft, nontender, no hepatosplenomegaly, no distention.  Skin: Intact without lesions or rashes.  Neurologic: Alert and oriented x 3.  Psych: Normal affect. Extremities: No clubbing or cyanosis.  HEENT: Normal.   He remains in NSR.   - Continue warfarin.  - Stay off Toprol XL.   - If HR in 50s or greater tomorrow, can start low dose amiodarone 100 mg daily for home.   TEE showed EF around 35%.  Initially diuresed with weight down about 30 lbs.  Creatinine started to rise, now at 1.99 and likely plateaued.   - If creatinine stable to lower, start Lasix 40 mg daily tomorrow.  - He can continue empagliflozin and Entresto 24/26 bid.   If HR remains stable and creatinine remains stable, I think he can go home tomorrow.   Loralie Champagne 11/07/2021 3:18 PM

## 2021-11-08 DIAGNOSIS — N179 Acute kidney failure, unspecified: Secondary | ICD-10-CM | POA: Diagnosis not present

## 2021-11-08 DIAGNOSIS — I10 Essential (primary) hypertension: Secondary | ICD-10-CM | POA: Diagnosis not present

## 2021-11-08 DIAGNOSIS — I4819 Other persistent atrial fibrillation: Secondary | ICD-10-CM | POA: Diagnosis not present

## 2021-11-08 DIAGNOSIS — I5043 Acute on chronic combined systolic (congestive) and diastolic (congestive) heart failure: Secondary | ICD-10-CM | POA: Diagnosis not present

## 2021-11-08 LAB — CBC
HCT: 40.7 % (ref 39.0–52.0)
Hemoglobin: 12.6 g/dL — ABNORMAL LOW (ref 13.0–17.0)
MCH: 28.2 pg (ref 26.0–34.0)
MCHC: 31 g/dL (ref 30.0–36.0)
MCV: 91.1 fL (ref 80.0–100.0)
Platelets: 222 10*3/uL (ref 150–400)
RBC: 4.47 MIL/uL (ref 4.22–5.81)
RDW: 16.1 % — ABNORMAL HIGH (ref 11.5–15.5)
WBC: 8.8 10*3/uL (ref 4.0–10.5)
nRBC: 0 % (ref 0.0–0.2)

## 2021-11-08 LAB — BASIC METABOLIC PANEL
Anion gap: 9 (ref 5–15)
BUN: 35 mg/dL — ABNORMAL HIGH (ref 8–23)
CO2: 27 mmol/L (ref 22–32)
Calcium: 9 mg/dL (ref 8.9–10.3)
Chloride: 102 mmol/L (ref 98–111)
Creatinine, Ser: 1.6 mg/dL — ABNORMAL HIGH (ref 0.61–1.24)
GFR, Estimated: 46 mL/min — ABNORMAL LOW (ref >60)
Glucose, Bld: 122 mg/dL — ABNORMAL HIGH (ref 70–99)
Potassium: 4.5 mmol/L (ref 3.5–5.1)
Sodium: 138 mmol/L (ref 135–145)

## 2021-11-08 LAB — PROCALCITONIN: Procalcitonin: <0.1 ng/mL

## 2021-11-08 LAB — GLUCOSE, CAPILLARY
Glucose-Capillary: 127 mg/dL — ABNORMAL HIGH (ref 70–99)
Glucose-Capillary: 119 mg/dL — ABNORMAL HIGH (ref 70–99)
Glucose-Capillary: 186 mg/dL — ABNORMAL HIGH (ref 70–99)
Glucose-Capillary: 137 mg/dL — ABNORMAL HIGH (ref 70–99)

## 2021-11-08 LAB — PROTIME-INR
INR: 2.2 — ABNORMAL HIGH (ref 0.8–1.2)
Prothrombin Time: 24.2 seconds — ABNORMAL HIGH (ref 11.4–15.2)

## 2021-11-08 MED ORDER — WARFARIN SODIUM 3 MG PO TABS: 6.0000 mg | ORAL_TABLET | Freq: Once | ORAL | Status: DC

## 2021-11-08 MED ORDER — WARFARIN SODIUM 3 MG PO TABS
3.0000 mg | ORAL_TABLET | Freq: Once | ORAL | Status: AC
  Administered 2021-11-08: 3 mg via ORAL
  Filled 2021-11-08: qty 1

## 2021-11-08 MED ORDER — SACUBITRIL-VALSARTAN 49-51 MG PO TABS
1.0000 | ORAL_TABLET | Freq: Two times a day (BID) | ORAL | Status: DC
  Administered 2021-11-08 – 2021-11-10 (×4): 1 via ORAL
  Filled 2021-11-08 (×4): qty 1

## 2021-11-08 NOTE — Progress Notes (Signed)
Progress Note  Patient Name: [name] Date of Encounter: 11/08/2021  [facility] Cardiologist: None   Subjective   Feeling better.  Easier to walk today than on previous attempts.   Inpatient Medications    Scheduled Meds:  docusate sodium  100 mg Oral BID   empagliflozin  10 mg Oral Daily   insulin aspart  0-15 Units Subcutaneous TID WC   insulin aspart  0-5 Units Subcutaneous QHS   insulin glargine-yfgn  15 Units Subcutaneous Daily   multivitamin with minerals  1 tablet Oral Daily   Ensure Max Protein  11 oz Oral BID   sacubitril-valsartan  1 tablet Oral BID   sodium chloride flush  3 mL Intravenous Q12H   triamcinolone 0.1 % cream : eucerin   Topical BID   Warfarin - Pharmacist Dosing Inpatient   Does not apply q1600   Continuous Infusions:  PRN Meds: acetaminophen **OR** [DISCONTINUED] acetaminophen, bisacodyl, hydrALAZINE, ondansetron **OR** ondansetron (ZOFRAN) IV, polyethylene glycol, traZODone   Vital Signs    Vitals:   11/07/21 0600 11/07/21 1126 11/07/21 1934 11/08/21 0401  BP: 140/72 (!) 143/70 (!) 147/71 138/68  Pulse: (!) 52 (!) 49  69  Resp:  18 20 20   Temp:  98.1 F (36.7 C) 97.7 F (36.5 C) 97.7 F (36.5 C)  TempSrc:  Oral Oral Oral  SpO2:  100% 97% 93%  Weight:   93.1 kg   Height:        Intake/Output Summary (Last 24 hours) at 11/08/2021 1038 Last data filed at 11/08/2021 1000 Gross per 24 hour  Intake 929 ml  Output 2700 ml  Net -1771 ml   Last 3 Weights 11/07/2021 11/07/2021 11/06/2021  Weight (lbs) 205 lb 4.8 oz 207 lb 8 oz 203 lb 4.2 oz  Weight (kg) 93.123 kg 94.121 kg 92.2 kg  Some encounter information is confidential and restricted. Go to Review Flowsheets activity to see all data.      Telemetry    Short episodes of atrial fibrillation.  Mostly sinus rhythm. - Personally Reviewed  ECG    N/a - Personally Reviewed  Physical Exam   GEN: No acute distress.   Neck: No JVD Cardiac: RRR, no murmurs, rubs, or  gallops.  Respiratory: Clear to auscultation bilaterally. GI: Soft, nontender, non-distended  MS: 1+ LE tense edema to lower tibia bilaterally; No deformity. Neuro:  Nonfocal  Psych: Normal affect   Labs    High Sensitivity Troponin:  No results for input(s): TROPONINIHS in the last 720 hours.   Chemistry Recent Labs  Lab 11/06/21 0354 11/07/21 0329 11/08/21 0417  NA 133* 135 138  K 4.0 4.5 4.5  CL 98 100 102  CO2 25 26 27   GLUCOSE 142* 107* 122*  BUN 44* 46* 35*  CREATININE 1.85* 1.99* 1.60*  CALCIUM 8.7* 8.9 9.0  GFRNONAA 38* 35* 46*  ANIONGAP 10 9 9     Lipids No results for input(s): CHOL, TRIG, HDL, LABVLDL, LDLCALC, CHOLHDL in the last 168 hours.  Hematology Recent Labs  Lab 11/06/21 0354 11/07/21 0329 11/08/21 0417  WBC 8.9 11.4* 8.8  RBC 4.65 4.34 4.47  HGB 12.8* 11.8* 12.6*  HCT 41.8 39.4 40.7  MCV 89.9 90.8 91.1  MCH 27.5 27.2 28.2  MCHC 30.6 29.9* 31.0  RDW 16.2* 16.3* 16.1*  PLT 208 200 222   Thyroid No results for input(s): TSH, FREET4 in the last 168 hours.  BNPNo results for input(s): BNP, PROBNP in the last 168 hours.  DDimer No results for input(s): DDIMER in the last 168 hours.   Radiology    No results found.  Cardiac Studies   TTE 10/29/21:  1. Left ventricular ejection fraction, by estimation, is 45 to 50%. The  left ventricle has mildly decreased function. The left ventricle  demonstrates regional wall motion abnormalities (see scoring  diagram/findings for description). Left ventricular  diastolic parameters are consistent with Grade II diastolic dysfunction  (pseudonormalization). Elevated left atrial pressure. There is mild global  left ventricular hypokinesis, slightly worse in the basal and mid segments  of the inferior and  inferolateral walls.   2. Right ventricular systolic function is moderately reduced. The right  ventricular size is moderately enlarged. There is severely elevated  pulmonary artery systolic pressure. The  estimated right ventricular  systolic pressure is 84.6 mmHg.   3. Left atrial size was moderately dilated.   4. Right atrial size was moderately dilated.   5. The mitral valve is normal in structure. No evidence of mitral valve  regurgitation.   6. Tricuspid valve regurgitation is mild to moderate.   7. The aortic valve is tricuspid. Aortic valve regurgitation is not  visualized. No aortic stenosis is present.   8. The inferior vena cava is dilated in size with <50% respiratory  variability, suggesting right atrial pressure of 15 mmHg.   Patient Profile     70 y.o. male with CKD 3, hypertension, diabetes, persistent atrial fibrillation/flutter, prior LV thrombus admitted with acute on chronic systolic diastolic heart failure.  Now status post successful cardioversion on 11/06/2021:  Assessment & Plan    # Acute on chronic systolic and diastolic heart failure:  TEE was topped early due to hypoxia.  LVEF was reportedly worse than on TTE a few days prior.  Continue Jardiance and Entresto.  BP stable and he was net -1.7L yesterday.  Increase Entresto to 49/51mg .   # Persistent atrial fibrillation:  # Bradycardia:  He underwent DCCV on 12/29.  Metoprolol and amiodarone were stopped after due to bradycardia.  Continue warfarin.   # CKD III:  Renal function stable today.  Creatinine is at his baseline 1.6 down from 1.99 yesterday.   # Prior LV thrombus:  Continue warfarin.      For questions or updates, please contact [facility] Please consult [email or web address] for contact info under        Signed, Chilton Si, MD  11/08/2021, 10:38 AM

## 2021-11-08 NOTE — Plan of Care (Signed)
  Problem: Activity: Goal: Capacity to carry out activities will improve Outcome: Progressing   Problem: Cardiac: Goal: Ability to achieve and maintain adequate cardiopulmonary perfusion will improve Outcome: Progressing   Problem: Clinical Measurements: Goal: Respiratory complications will improve Outcome: Progressing   

## 2021-11-08 NOTE — Progress Notes (Signed)
CARDIAC REHAB PHASE I   PRE:  Rate/Rhythm: 94 SR  BP:  Supine:   Sitting: 113/96  Standing:    SaO2: 95% RA  MODE:  Ambulation: 420 ft   POST:  Rate/Rhythm: 112 ST  BP:  Supine:   Sitting: 136/72  Standing:    SaO2: 96% RA  [id number] Patient tolerated ambulation well with assist x1 , using cane and gait belt. Gait slow, steady, 1 standing rest break taken. To bedside after walk, vital signs within normal limits. Call bell within reach.   Artist Pais, MS, ACSM CEP

## 2021-11-08 NOTE — Progress Notes (Signed)
PROGRESS NOTE    [name]  HBZ:[id number]  DOB: 1951-07-31  DOA: 10/28/2021 PCP: Caesar Bookman, NP Outpatient Specialists:   Hospital course:   70 year old with a history of ADHD, chronic atrial fibrillation on Coumadin, chronic systolic CHF, CKD stage IIIa, DM2, peripheral vascular disease status post left fifth toe amputation, visual impairment, HTN, and OSA who presented to the ED with shortness of breath, peripheral edema, weight gain (33 pounds in 1 month),-found to have acute on chronic systolic heart failure.  Subjective:  Patient feels reasonably well.  Is very pleased with the decreased lower extremity edema.  Notes he was able to walk down the hallway and back without shortness of breath.  Is pleased with progress   Objective: Vitals:   11/07/21 1126 11/07/21 1934 11/08/21 0401 11/08/21 1043  BP: (!) 143/70 (!) 147/71 138/68 134/74  Pulse: (!) 49  69 70  Resp: 18 20 20 18   Temp: 98.1 F (36.7 C) 97.7 F (36.5 C) 97.7 F (36.5 C) (!) 97.5 F (36.4 C)  TempSrc: Oral Oral Oral Oral  SpO2: 100% 97% 93%   Weight:  93.1 kg    Height:        Intake/Output Summary (Last 24 hours) at 11/08/2021 1700 Last data filed at 11/08/2021 1000 Gross per 24 hour  Intake 693 ml  Output 2200 ml  Net -1507 ml   Filed Weights   11/06/21 1329 11/07/21 0400 11/07/21 1934  Weight: 92.2 kg 94.1 kg 93.1 kg     Exam:  General: Reasonably well-appearing gentleman sitting up in bed in no acute distress off oxygen. Eyes: sclera anicteric, conjuctiva mild injection bilaterally CVS: S1-S2, regular  Respiratory:  decreased air entry bilaterally secondary to decreased inspiratory effort, rales at bases  GI: NABS, soft, NT  LE: 1+ edema with wrinkling of skin consistent with diuresis of previously significant lower extremity edema Neuro: A/O x 3, Moving all extremities equally with normal strength, CN 3-12 intact, grossly nonfocal.  Psych: patient is logical and  coherent, judgement and insight appear normal, mood and affect appropriate to situation.   Assessment & Plan:   Acute on chronic HFpEF/HFrEF EF 45 to 50% with grade 2 diastolic dysfunction Patient seems to have diuresed very well, off oxygen, decreased lower extremity edema Continue Jardiance Entresto restarted, will need to follow creatinine as he had had mild AKI in the past.  AKI Creatinine improved overnight  PAF Status post cardioversion 12/29 Amiodarone and metoprolol were stopped due to bradycardia Continue warfarin  DM2 Under reasonable control on present management  History of LV thrombus Continue warfarin   DVT prophylaxis: Coumadin Code Status: Full Family Communication: None today Disposition Plan:   Patient is from: Home  Anticipated Discharge Location: Home  Barriers to Discharge: Ongoing diuresis  Is patient medically stable for Discharge: Not yet   Scheduled Meds:  docusate sodium  100 mg Oral BID   empagliflozin  10 mg Oral Daily   insulin aspart  0-15 Units Subcutaneous TID WC   insulin aspart  0-5 Units Subcutaneous QHS   insulin glargine-yfgn  15 Units Subcutaneous Daily   multivitamin with minerals  1 tablet Oral Daily   Ensure Max Protein  11 oz Oral BID   sacubitril-valsartan  1 tablet Oral BID   sodium chloride flush  3 mL Intravenous Q12H   triamcinolone 0.1 % cream : eucerin   Topical BID   warfarin  3 mg Oral ONCE-1600   Warfarin - Pharmacist Dosing Inpatient  Does not apply q1600   Continuous Infusions:  Data Reviewed:  Basic Metabolic Panel: Recent Labs  Lab 11/04/21 0429 11/05/21 0401 11/06/21 0354 11/07/21 0329 11/08/21 0417  NA 138 137 133* 135 138  K 3.4* 3.8 4.0 4.5 4.5  CL 98 99 98 100 102  CO2 29 27 25 26 27   GLUCOSE 173* 140* 142* 107* 122*  BUN 44* 49* 44* 46* 35*  CREATININE 1.60* 1.85* 1.85* 1.99* 1.60*  CALCIUM 8.8* 8.6* 8.7* 8.9 9.0    CBC: Recent Labs  Lab 11/02/21 0238 11/05/21 0401 11/06/21 0354  11/07/21 0329 11/08/21 0417  WBC 6.3 9.3 8.9 11.4* 8.8  HGB 11.9* 13.1 12.8* 11.8* 12.6*  HCT 38.5* 42.0 41.8 39.4 40.7  MCV 90.2 88.8 89.9 90.8 91.1  PLT 151 213 208 200 222    Studies: No results found.  Principal Problem:   Acute on chronic combined systolic and diastolic CHF (congestive heart failure) (HCC) Active Problems:   Type 2 diabetes mellitus with stage 3 chronic kidney disease, with long-term current use of insulin (HCC)   HTN (hypertension)   Acute-on-chronic kidney injury ([place])   Atrial fibrillation (HCC)   Pressure injury of skin   Acute on chronic combined systolic (congestive) and diastolic (congestive) heart failure ([place])     Ester Hilley Tublu Shubh Chiara, [facility]  If 7PM-7AM, please contact night-coverage [email or web address]   LOS: 8 days

## 2021-11-09 ENCOUNTER — Encounter (HOSPITAL_COMMUNITY): Payer: Self-pay | Admitting: Cardiology

## 2021-11-09 DIAGNOSIS — I5043 Acute on chronic combined systolic (congestive) and diastolic (congestive) heart failure: Secondary | ICD-10-CM | POA: Diagnosis not present

## 2021-11-09 DIAGNOSIS — I4819 Other persistent atrial fibrillation: Secondary | ICD-10-CM | POA: Diagnosis not present

## 2021-11-09 DIAGNOSIS — I10 Essential (primary) hypertension: Secondary | ICD-10-CM | POA: Diagnosis not present

## 2021-11-09 LAB — BASIC METABOLIC PANEL
Anion gap: 7 (ref 5–15)
BUN: 31 mg/dL — ABNORMAL HIGH (ref 8–23)
CO2: 29 mmol/L (ref 22–32)
Calcium: 8.9 mg/dL (ref 8.9–10.3)
Chloride: 102 mmol/L (ref 98–111)
Creatinine, Ser: 1.57 mg/dL — ABNORMAL HIGH (ref 0.61–1.24)
GFR, Estimated: 47 mL/min — ABNORMAL LOW (ref >60)
Glucose, Bld: 105 mg/dL — ABNORMAL HIGH (ref 70–99)
Potassium: 4.5 mmol/L (ref 3.5–5.1)
Sodium: 138 mmol/L (ref 135–145)

## 2021-11-09 LAB — GLUCOSE, CAPILLARY
Glucose-Capillary: 116 mg/dL — ABNORMAL HIGH (ref 70–99)
Glucose-Capillary: 132 mg/dL — ABNORMAL HIGH (ref 70–99)
Glucose-Capillary: 143 mg/dL — ABNORMAL HIGH (ref 70–99)
Glucose-Capillary: 212 mg/dL — ABNORMAL HIGH (ref 70–99)
Glucose-Capillary: 138 mg/dL — ABNORMAL HIGH (ref 70–99)

## 2021-11-09 LAB — CBC
HCT: 41.7 % (ref 39.0–52.0)
Hemoglobin: 12.7 g/dL — ABNORMAL LOW (ref 13.0–17.0)
MCH: 27.8 pg (ref 26.0–34.0)
MCHC: 30.5 g/dL (ref 30.0–36.0)
MCV: 91.2 fL (ref 80.0–100.0)
Platelets: 224 10*3/uL (ref 150–400)
RBC: 4.57 MIL/uL (ref 4.22–5.81)
RDW: 15.6 % — ABNORMAL HIGH (ref 11.5–15.5)
WBC: 7.5 10*3/uL (ref 4.0–10.5)
nRBC: 0 % (ref 0.0–0.2)

## 2021-11-09 LAB — PROCALCITONIN: Procalcitonin: 10.69 ng/mL

## 2021-11-09 LAB — PROTIME-INR
INR: 2.2 — ABNORMAL HIGH (ref 0.8–1.2)
Prothrombin Time: 24.3 seconds — ABNORMAL HIGH (ref 11.4–15.2)

## 2021-11-09 MED ORDER — WARFARIN SODIUM 3 MG PO TABS
6.0000 mg | ORAL_TABLET | Freq: Once | ORAL | Status: AC
  Administered 2021-11-09: 6 mg via ORAL
  Filled 2021-11-09: qty 2

## 2021-11-09 MED ORDER — AMIODARONE HCL 100 MG PO TABS
100.0000 mg | ORAL_TABLET | Freq: Every day | ORAL | Status: DC
  Administered 2021-11-09: 100 mg via ORAL
  Filled 2021-11-09 (×2): qty 1

## 2021-11-09 MED ORDER — SPIRONOLACTONE 12.5 MG HALF TABLET
12.5000 mg | ORAL_TABLET | Freq: Every day | ORAL | Status: DC
  Administered 2021-11-09 – 2021-11-10 (×2): 12.5 mg via ORAL
  Filled 2021-11-09 (×2): qty 1

## 2021-11-09 NOTE — Progress Notes (Signed)
ANTICOAGULATION CONSULT NOTE - Follow Up Consult  Pharmacy Consult for warfarin Indication: atrial fibrillation  No Known Allergies  Patient Measurements: Height: 5\' 8"  (172.7 cm) Weight: 92.4 kg (203 lb 12.8 oz) IBW/kg (Calculated) : 68.4  Vital Signs: Temp: 97.7 F (36.5 C) (01/01 0432) Temp Source: Oral (01/01 0432) BP: 143/84 (01/01 0432) Pulse Rate: 75 (01/01 0432)  Labs: Recent Labs    11/07/21 0329 11/08/21 0417 11/09/21 0317  HGB 11.8* 12.6* 12.7*  HCT 39.4 40.7 41.7  PLT 200 222 224  LABPROT 24.5* 24.2* 24.3*  INR 2.2* 2.2* 2.2*  CREATININE 1.99* 1.60* 1.57*     Estimated Creatinine Clearance: 47.6 mL/min (A) (by C-G formula based on SCr of 1.57 mg/dL (H)).  Assessment: 71 yo male admitted for acute on chronic combined systolic and diastolic CHF. He has a history of a fib and was on warfarin prior to admission.   PTA dose: 3mg  qTues/Sat, 6mg  all other days, Admit INR 1.9   INR continues to be therapeutic at 2.2. Hgb 12.7, plt 224. Oral intake documented at 100% and 75% yesterday.  Goal of Therapy:  INR 2-3 Monitor platelets by anticoagulation protocol: Yes   Plan:  Warfarin 6mg  x1 today INR daily Monitor for s/sx of bleeding   Thank you for including pharmacy in the care of this patient.  Zenaida Deed, PharmD PGY1 Acute Care Pharmacy Resident  Phone: [phone] 11/09/2021 9:51 AM  Please check [email or web address] for unit-specific pharmacy phone numbers.

## 2021-11-09 NOTE — Progress Notes (Signed)
ANTICOAGULATION CONSULT NOTE - Follow Up Consult  Pharmacy Consult for warfarin Indication: atrial fibrillation  No Known Allergies  Patient Measurements: Height: 5\' 8"  (172.7 cm) Weight: 92.4 kg (203 lb 12.8 oz) IBW/kg (Calculated) : 68.4  Vital Signs: Temp: 97.7 F (36.5 C) (01/01 0432) Temp Source: Oral (01/01 0432) BP: 143/84 (01/01 0432) Pulse Rate: 75 (01/01 0432)  Labs: Recent Labs    11/07/21 0329 11/08/21 0417 11/09/21 0317  HGB 11.8* 12.6* 12.7*  HCT 39.4 40.7 41.7  PLT 200 222 224  LABPROT 24.5* 24.2* 24.3*  INR 2.2* 2.2* 2.2*  CREATININE 1.99* 1.60* 1.57*    Estimated Creatinine Clearance: 47.6 mL/min (A) (by C-G formula based on SCr of 1.57 mg/dL (H)).  Assessment: 71 yo male admitted for acute on chronic combined systolic and diastolic CHF. He has a history of a fib and was on warfarin prior to admission.   PTA dose: 3mg  qTues/Sat, 6mg  all other days, Admit INR 1.9   INR is therapeutic at 2.2. Hgb 12.6, plt 222. Oral intake documented at 100%.  Goal of Therapy:  INR 2-3 Monitor platelets by anticoagulation protocol: Yes   Plan:  Warfarin 3mg  x1 today INR tomorrow Monitor for s/sx of bleeding   Thank you for including pharmacy in the care of this patient.  Zenaida Deed, PharmD PGY1 Acute Care Pharmacy Resident  Phone: [phone] 11/08/2021  12:05 PM  Please check [email or web address] for unit-specific pharmacy phone numbers.

## 2021-11-09 NOTE — Progress Notes (Signed)
Mobility Specialist: Progress Note   11/09/21 1437  Mobility  Activity Ambulated in hall  Level of Assistance Standby assist, set-up cues, supervision of patient - no hands on  Assistive Device Cane  Distance Ambulated (ft) 470 ft  Mobility Ambulated with assistance in hallway  Mobility Response Tolerated well  Mobility performed by Mobility specialist  $Mobility charge 1 Mobility   Pre-Mobility: 95 HR Post-Mobility: 106 HR  Pt had no c/o throughout ambulation. Pt sitting EOB after walk with call bell and phone in reach.   [facility] Eldridge Marcott Mobility Specialist Mobility Specialist 4 [place]: [phone] Mobility Specialist 2 [place] and 6 [place]: [phone]

## 2021-11-09 NOTE — Progress Notes (Signed)
Progress Note  Patient Name: [name] Date of Encounter: 11/09/2021  [facility] Cardiologist: None   Subjective   Feeling better.  Not ready to go home today.  Breathing better today.    Inpatient Medications    Scheduled Meds:  docusate sodium  100 mg Oral BID   empagliflozin  10 mg Oral Daily   insulin aspart  0-15 Units Subcutaneous TID WC   insulin aspart  0-5 Units Subcutaneous QHS   insulin glargine-yfgn  15 Units Subcutaneous Daily   multivitamin with minerals  1 tablet Oral Daily   Ensure Max Protein  11 oz Oral BID   sacubitril-valsartan  1 tablet Oral BID   sodium chloride flush  3 mL Intravenous Q12H   triamcinolone 0.1 % cream : eucerin   Topical BID   warfarin  6 mg Oral ONCE-1600   Warfarin - Pharmacist Dosing Inpatient   Does not apply q1600   Continuous Infusions:  PRN Meds: acetaminophen **OR** [DISCONTINUED] acetaminophen, bisacodyl, hydrALAZINE, ondansetron **OR** ondansetron (ZOFRAN) IV, polyethylene glycol, traZODone   Vital Signs    Vitals:   11/08/21 1043 11/08/21 2118 11/09/21 0432 11/09/21 0437  BP: 134/74 (!) 153/78 (!) 143/84   Pulse: 70  75   Resp: 18 20 18    Temp: (!) 97.5 F (36.4 C) 97.7 F (36.5 C) 97.7 F (36.5 C)   TempSrc: Oral Oral Oral   SpO2:  95% 94%   Weight:    92.4 kg  Height:        Intake/Output Summary (Last 24 hours) at 11/09/2021 0958 Last data filed at 11/09/2021 0500 Gross per 24 hour  Intake 893 ml  Output 1575 ml  Net -682 ml   Last 3 Weights 11/09/2021 11/07/2021 11/07/2021  Weight (lbs) 203 lb 12.8 oz 205 lb 4.8 oz 207 lb 8 oz  Weight (kg) 92.443 kg 93.123 kg 94.121 kg  Some encounter information is confidential and restricted. Go to Review Flowsheets activity to see all data.      Telemetry    Short episodes of atrial fibrillation.  Mostly sinus rhythm. - Personally Reviewed  ECG    N/a - Personally Reviewed  Physical Exam   GEN: No acute distress.   Neck: No JVD Cardiac: RRR, no  murmurs, rubs, or gallops.  Respiratory: Clear to auscultation bilaterally. GI: Soft, nontender, non-distended  MS: 1+ LE tense edema to lower tibia bilaterally; No deformity. Neuro:  Nonfocal  Psych: Normal affect   Labs    High Sensitivity Troponin:  No results for input(s): TROPONINIHS in the last 720 hours.   Chemistry Recent Labs  Lab 11/07/21 0329 11/08/21 0417 11/09/21 0317  NA 135 138 138  K 4.5 4.5 4.5  CL 100 102 102  CO2 26 27 29   GLUCOSE 107* 122* 105*  BUN 46* 35* 31*  CREATININE 1.99* 1.60* 1.57*  CALCIUM 8.9 9.0 8.9  GFRNONAA 35* 46* 47*  ANIONGAP 9 9 7     Lipids No results for input(s): CHOL, TRIG, HDL, LABVLDL, LDLCALC, CHOLHDL in the last 168 hours.  Hematology Recent Labs  Lab 11/07/21 0329 11/08/21 0417 11/09/21 0317  WBC 11.4* 8.8 7.5  RBC 4.34 4.47 4.57  HGB 11.8* 12.6* 12.7*  HCT 39.4 40.7 41.7  MCV 90.8 91.1 91.2  MCH 27.2 28.2 27.8  MCHC 29.9* 31.0 30.5  RDW 16.3* 16.1* 15.6*  PLT 200 222 224   Thyroid No results for input(s): TSH, FREET4 in the last 168 hours.  BNPNo results  for input(s): BNP, PROBNP in the last 168 hours.  DDimer No results for input(s): DDIMER in the last 168 hours.   Radiology    No results found.  Cardiac Studies   TTE 10/29/21:  1. Left ventricular ejection fraction, by estimation, is 45 to 50%. The  left ventricle has mildly decreased function. The left ventricle  demonstrates regional wall motion abnormalities (see scoring  diagram/findings for description). Left ventricular  diastolic parameters are consistent with Grade II diastolic dysfunction  (pseudonormalization). Elevated left atrial pressure. There is mild global  left ventricular hypokinesis, slightly worse in the basal and mid segments  of the inferior and  inferolateral walls.   2. Right ventricular systolic function is moderately reduced. The right  ventricular size is moderately enlarged. There is severely elevated  pulmonary artery  systolic pressure. The estimated right ventricular  systolic pressure is 99991111 mmHg.   3. Left atrial size was moderately dilated.   4. Right atrial size was moderately dilated.   5. The mitral valve is normal in structure. No evidence of mitral valve  regurgitation.   6. Tricuspid valve regurgitation is mild to moderate.   7. The aortic valve is tricuspid. Aortic valve regurgitation is not  visualized. No aortic stenosis is present.   8. The inferior vena cava is dilated in size with <50% respiratory  variability, suggesting right atrial pressure of 15 mmHg.   Patient Profile     71 y.o. male with CKD 3, hypertension, diabetes, persistent atrial fibrillation/flutter, prior LV thrombus admitted with acute on chronic systolic diastolic heart failure.  Now status post successful cardioversion on 11/06/2021:  Assessment & Plan    # Acute on chronic systolic and diastolic heart failure:  TEE was topped early due to hypoxia.  LVEF was 35%.  Continue Jardiance and Entresto.  BP stable and he was net -785mL yesterday.  Increased Entresto to 49/51mg .  If his heart rate tolerates adding amiodarone today, will add low dose metoprolol prior to discharge.  # Persistent atrial fibrillation:  # Bradycardia:  He underwent DCCV on 12/29.  Metoprolol and amiodarone were stopped after due to bradycardia.  Continue warfarin.  Will add amiodarone 100mg  daily today.  He continues to have short breakthroughs of atrial fibrillation.   Add metoprolol succinate 25mg  daily tomorrow if HR is stable.   # CKD III:  Renal function stable today.  Creatinine is at his baseline 1.6 down from 1.99 earlier this admission   # Prior LV thrombus:  Continue warfarin.      For questions or updates, please contact [place] Please consult [email or web address] for contact info under        Signed, Skeet Latch, MD  11/09/2021, 9:58 AM

## 2021-11-09 NOTE — Progress Notes (Signed)
PROGRESS NOTE    [name]  HOZ:[id number]  DOB: [birth year]  DOA: 10/28/2021 PCP: Caesar Bookman, NP Outpatient Specialists:   Hospital course:   71 year old with a history of ADHD, chronic atrial fibrillation on Coumadin, chronic systolic CHF, CKD stage IIIa, DM2, peripheral vascular disease status post left fifth toe amputation, visual impairment, HTN, and OSA who presented to the ED with shortness of breath, peripheral edema, weight gain (33 pounds in 1 month),-found to have acute on chronic systolic heart failure.  Subjective:  Patient continues to feel okay.  Has no acute complaints.  Denies fatigue or dizziness despite low heart rate.  Denies orthostatic symptoms.   Objective: Vitals:   11/09/21 0437 11/09/21 0700 11/09/21 1212 11/09/21 1617  BP:  140/89 (!) 170/80 (!) 162/79  Pulse:  73 85 75  Resp:  20 20 19   Temp:  98 F (36.7 C) 98 F (36.7 C) 97.7 F (36.5 C)  TempSrc:  Oral Oral Oral  SpO2:  94% 95% 95%  Weight: 92.4 kg     Height:        Intake/Output Summary (Last 24 hours) at 11/09/2021 1719 Last data filed at 11/09/2021 1709 Gross per 24 hour  Intake 2013 ml  Output 1525 ml  Net 488 ml    Filed Weights   11/07/21 0400 11/07/21 1934 11/09/21 0437  Weight: 94.1 kg 93.1 kg 92.4 kg     Exam:  General: Reasonably well-appearing gentleman s lying flat in bed in no acute distress off oxygen. Eyes: sclera anicteric, conjuctiva mild injection bilaterally CVS: S1-S2, regular  Respiratory:  decreased air entry bilaterally secondary to decreased inspiratory effort, rales at bases  GI: NABS, soft, NT  LE: 1+ edema with wrinkling of skin consistent with diuresis of previously significant lower extremity edema Neuro: A/O x 3, Moving all extremities equally with normal strength, CN 3-12 intact, grossly nonfocal.  Psych: patient is logical and coherent, judgement and insight appear normal, mood and affect appropriate to situation.   Assessment &  Plan:   Acute on chronic HFpEF/HFrEF EF 45 to 50% with grade 2 diastolic dysfunction Patient seems to have diuresed very well, off oxygen, decreased lower extremity edema Continue Jardiance Entresto restarted, will need to follow creatinine as he had had mild AKI in the past.  PAF complicated by bradycardia Status post cardioversion 12/29 Amiodarone and metoprolol were previously stopped due to bradycardia Amiodarone restarted today per cardiology Will need to follow heart rate closely Per cardiology note, plan is to add Toprol-XL 25 before discharge if heart rate tolerates addition of amiodarone today. Continue warfarin  AKI Creatinine improved overnight  DM2 Under reasonable control on present management  History of LV thrombus Continue warfarin   DVT prophylaxis: Coumadin Code Status: Full Family Communication: None today Disposition Plan:   Patient is from: Home  Anticipated Discharge Location: Home  Barriers to Discharge: Ongoing diuresis  Is patient medically stable for Discharge: Not yet   Scheduled Meds:  amiodarone  100 mg Oral Daily   docusate sodium  100 mg Oral BID   empagliflozin  10 mg Oral Daily   insulin aspart  0-15 Units Subcutaneous TID WC   insulin aspart  0-5 Units Subcutaneous QHS   insulin glargine-yfgn  15 Units Subcutaneous Daily   multivitamin with minerals  1 tablet Oral Daily   Ensure Max Protein  11 oz Oral BID   sacubitril-valsartan  1 tablet Oral BID   sodium chloride flush  3 mL Intravenous  Q12H   spironolactone  12.5 mg Oral Daily   triamcinolone 0.1 % cream : eucerin   Topical BID   Warfarin - Pharmacist Dosing Inpatient   Does not apply q1600   Continuous Infusions:  Data Reviewed:  Basic Metabolic Panel: Recent Labs  Lab 11/05/21 0401 11/06/21 0354 11/07/21 0329 11/08/21 0417 11/09/21 0317  NA 137 133* 135 138 138  K 3.8 4.0 4.5 4.5 4.5  CL 99 98 100 102 102  CO2 27 25 26 27 29   GLUCOSE 140* 142* 107* 122* 105*   BUN 49* 44* 46* 35* 31*  CREATININE 1.85* 1.85* 1.99* 1.60* 1.57*  CALCIUM 8.6* 8.7* 8.9 9.0 8.9     CBC: Recent Labs  Lab 11/05/21 0401 11/06/21 0354 11/07/21 0329 11/08/21 0417 11/09/21 0317  WBC 9.3 8.9 11.4* 8.8 7.5  HGB 13.1 12.8* 11.8* 12.6* 12.7*  HCT 42.0 41.8 39.4 40.7 41.7  MCV 88.8 89.9 90.8 91.1 91.2  PLT 213 208 200 222 224     Studies: No results found.  Principal Problem:   Acute on chronic combined systolic and diastolic CHF (congestive heart failure) (HCC) Active Problems:   Type 2 diabetes mellitus with stage 3 chronic kidney disease, with long-term current use of insulin (HCC)   HTN (hypertension)   Acute-on-chronic kidney injury ([place])   Atrial fibrillation (HCC)   Pressure injury of skin   Acute on chronic combined systolic (congestive) and diastolic (congestive) heart failure ([place])     Egor Fullilove Tublu Jumar [place], [facility]  If 7PM-7AM, please contact night-coverage [email or web address]   LOS: 9 days

## 2021-11-10 ENCOUNTER — Other Ambulatory Visit (HOSPITAL_COMMUNITY): Payer: Self-pay

## 2021-11-10 LAB — CBC
HCT: 42.6 % (ref 39.0–52.0)
Hemoglobin: 13.2 g/dL (ref 13.0–17.0)
MCH: 27.8 pg (ref 26.0–34.0)
MCHC: 31 g/dL (ref 30.0–36.0)
MCV: 89.7 fL (ref 80.0–100.0)
Platelets: 234 10*3/uL (ref 150–400)
RBC: 4.75 MIL/uL (ref 4.22–5.81)
RDW: 15.5 % (ref 11.5–15.5)
WBC: 7.6 10*3/uL (ref 4.0–10.5)
nRBC: 0 % (ref 0.0–0.2)

## 2021-11-10 LAB — BASIC METABOLIC PANEL
Anion gap: 6 (ref 5–15)
BUN: 27 mg/dL — ABNORMAL HIGH (ref 8–23)
CO2: 24 mmol/L (ref 22–32)
Calcium: 8.8 mg/dL — ABNORMAL LOW (ref 8.9–10.3)
Chloride: 107 mmol/L (ref 98–111)
Creatinine, Ser: 1.67 mg/dL — ABNORMAL HIGH (ref 0.61–1.24)
GFR, Estimated: 43 mL/min — ABNORMAL LOW (ref >60)
Glucose, Bld: 119 mg/dL — ABNORMAL HIGH (ref 70–99)
Potassium: 4.4 mmol/L (ref 3.5–5.1)
Sodium: 137 mmol/L (ref 135–145)

## 2021-11-10 LAB — GLUCOSE, CAPILLARY
Glucose-Capillary: 158 mg/dL — ABNORMAL HIGH (ref 70–99)
Glucose-Capillary: 164 mg/dL — ABNORMAL HIGH (ref 70–99)
Glucose-Capillary: 152 mg/dL — ABNORMAL HIGH (ref 70–99)

## 2021-11-10 LAB — PROTIME-INR
INR: 2.3 — ABNORMAL HIGH (ref 0.8–1.2)
Prothrombin Time: 25.1 seconds — ABNORMAL HIGH (ref 11.4–15.2)

## 2021-11-10 MED ORDER — AMIODARONE HCL 200 MG PO TABS
200.0000 mg | ORAL_TABLET | Freq: Every day | ORAL | 0 refills | Status: DC
Start: 2021-11-11 — End: 2021-12-09
  Filled 2021-11-10: qty 30, 30d supply, fill #0

## 2021-11-10 MED ORDER — SACUBITRIL-VALSARTAN 49-51 MG PO TABS
1.0000 | ORAL_TABLET | Freq: Two times a day (BID) | ORAL | 0 refills | Status: DC
  Filled 2021-11-10: qty 60, 30d supply, fill #0

## 2021-11-10 MED ORDER — EMPAGLIFLOZIN 10 MG PO TABS
10.0000 mg | ORAL_TABLET | Freq: Every day | ORAL | 0 refills | Status: DC
  Filled 2021-11-10: qty 30, 30d supply, fill #0

## 2021-11-10 MED ORDER — SPIRONOLACTONE 25 MG PO TABS
12.5000 mg | ORAL_TABLET | Freq: Every day | ORAL | 0 refills | Status: DC
  Filled 2021-11-10: qty 15, 30d supply, fill #0

## 2021-11-10 MED ORDER — AMIODARONE HCL 200 MG PO TABS
200.0000 mg | ORAL_TABLET | Freq: Every day | ORAL | Status: DC
  Administered 2021-11-10: 200 mg via ORAL
  Filled 2021-11-10: qty 1

## 2021-11-10 MED ORDER — WARFARIN SODIUM 3 MG PO TABS
6.0000 mg | ORAL_TABLET | Freq: Once | ORAL | Status: AC
  Administered 2021-11-10: 6 mg via ORAL
  Filled 2021-11-10 (×2): qty 2

## 2021-11-10 NOTE — Progress Notes (Signed)
Still waiting on the ride. Ready for d/c

## 2021-11-10 NOTE — Progress Notes (Signed)
Patient discharged: Home with family  Via: Wheelchair   Discharge paperwork given: to patient and family  Reviewed with teach back  IV and telemetry disconnected  Belongings given to patient    

## 2021-11-10 NOTE — Discharge Summary (Signed)
[name] HYW:[id number] DOB: Mar 02, 1951 DOA: 10/28/2021  PCP: Sandrea Hughs, NP  Admit date: 10/28/2021  Discharge date: 11/10/2021  Admitted From: home   Disposition:  home   Recommendations for Outpatient Follow-up:   Follow up with PCP in 2-3 days PCP please follow up in INR/manage coumadin.   Consultations: Cardiology Discharge Condition: Improved CODE STATUS: Full Diet Recommendation: Heart Healthy very low salt, carb modified  Diet Order             Diet - low sodium heart healthy           Diet heart healthy/carb modified Room service appropriate? Yes; Fluid consistency: Thin  Diet effective now                    Chief Complaint  Patient presents with   Leg Swelling     Brief history of present illness from the day of admission and additional interim summary    71 year old with a history of ADHD, chronic atrial fibrillation on Coumadin, chronic systolic CHF, CKD stage IIIa, DM2, peripheral vascular disease status post left fifth toe amputation, visual impairment, HTN, and OSA who presented to the ED with shortness of breath, peripheral edema, weight gain (33 pounds in 1 month),-found to have acute on chronic systolic heart failure.                                                                 Hospital Course   Patient was admitted for diuresis, optimization of heart failure meds and management of paroxysmal atrial flutter.  Patient was followed closely by cardiology who managed above issues.  Patient diuresed well and by day of discharge had no lower extremity edema and was ambulating without difficulty.  There was a minimal rise in creatinine with diuresis which has stabilized to what appears to be a new baseline around 1.5.  PAF was managed by cardiology with attempt at [place] on  11/06/2021 after being loaded with amiodarone.  Cardioversion however was unsuccessful and patient reverted back to atrial flutter.  Patient's course was also complicated by bradycardia so metoprolol and amiodarone were held with resultant improvement in heart rate.  Patient was initiated on Entresto, spironolactone and Jardiance which he has tolerated well.  Patient was restarted on amiodarone 200 mg daily without bradycardia.  Patient is discharged home off of his beta-blocker.  He is to follow-up closely with cardiology as scheduled by them.  #Acute on chronic systolic heart failure, EF 35% -History of reduced ejection fraction 20% in 2017.  EF did recover up to 55-60% in October 2021. -No symptoms of angina but has refused cardiac cath multiple times. -He has been effectively diuresed.  Euvolemic. -Net -19.5 L since admission. -Kidney function is stable.   -Continue Entresto 49-51 mg  twice daily.  Continue Jardiance 10 mg daily.  He is on Aldactone 12.5 mg daily. -Hold beta-blocker due to bradycardia.  Need for amiodarone discussed below. -Resume Lasix 20 mg twice daily at discharge. -Patient has been instructed on self reduction.  This was the big problem with his recurrence of heart failure.     #Persistent atrial fibrillation #Sinus bradycardia/sick sinus syndrome -Status post TEE/cardioversion on 11/06/2021. -Back in atrial fibrillation. -Beta-blocker held due to bradycardia.  Concerns for sick sinus syndrome. -For now continue amiodarone.  He will likely be considered for pacemaker therapy as an outpatient. -Amiodarone increased to 200 mg daily.  Hopefully he will convert back to sinus rhythm on this. -Continue Coumadin given history of LV thrombus.  Goal INR 2-3   #CKD stage III -eGFR stable.  Continue heart failure medications.   #Prior LV thrombus -Continue warfarin.  Discharge diagnosis     Principal Problem:   Acute on chronic combined systolic and diastolic CHF  (congestive heart failure) (HCC) Active Problems:   Type 2 diabetes mellitus with stage 3 chronic kidney disease, with long-term current use of insulin (HCC)   HTN (hypertension)   Acute-on-chronic kidney injury (HCC)   Atrial fibrillation (HCC)   Pressure injury of skin   Acute on chronic combined systolic (congestive) and diastolic (congestive) heart failure [facility])    Discharge instructions    Discharge Instructions     (HEART FAILURE PATIENTS) Call MD:  Anytime you have any of the following symptoms: 1) 3 pound weight gain in 24 hours or 5 pounds in 1 week 2) shortness of breath, with or without a dry hacking cough 3) swelling in the hands, feet or stomach 4) if you have to sleep on extra pillows at night in order to breathe.   Complete by: As directed    Diet - low sodium heart healthy   Complete by: As directed    Discharge instructions   Complete by: As directed    1. THESE ARE THE HEART MEDICATIONS YOU SHOULD BE TAKING: ENTRESTO TWICE A DAY, JARDIANCE ONCE A DAY, ALDACTONE ONCE A DAY, LASIX TWICE A DAY, AMIODARONE ONCE A DAY AND COUMADIN.   2. MAKE SURE TO GET YOUR INR/COUMADIN LEVEL CHECKED IN THE NEXT 2-3 DAYS.  3. YOU WILL BE GIVEN A FOLLOWUP APPOINTMENT WITH CARDIOLOGY.   Increase activity slowly   Complete by: As directed    No wound care   Complete by: As directed        Discharge Medications   Allergies as of 11/10/2021   No Known Allergies      Medication List     STOP taking these medications    metoprolol succinate 100 MG 24 hr tablet Commonly known as: TOPROL-XL       TAKE these medications    amiodarone 200 MG tablet Commonly known as: PACERONE Take 1 tablet (200 mg total) by mouth daily. Start taking on: November 11, 2021   blood glucose meter kit and supplies Dispense based on patient and insurance preference. Use up to four times daily as directed. (FOR ICD-10 E10.9, E11.9).   diclofenac Sodium 1 % Gel Commonly known as: Voltaren Apply  2 g topically 4 (four) times daily. What changed:  when to take this reasons to take this   empagliflozin 10 MG Tabs tablet Commonly known as: JARDIANCE Take 1 tablet (10 mg total) by mouth daily. Start taking on: November 11, 2021   furosemide 20 MG tablet Commonly known as: LASIX Take  1 tablet (20 mg total) by mouth 2 (two) times daily. May take an additional 42m daily as needed.   GREEN TEA EXTRACT PO Take 1 Dose by mouth daily.   HYALURONIC ACID PO Take 1 tablet by mouth daily.   insulin glargine 100 UNIT/ML injection Commonly known as: LANTUS Inject 0.08 mLs (8 Units total) into the skin daily.   KRILL OIL OMEGA-3 PO Take 1 tablet by mouth daily.   polyethylene glycol powder 17 GM/SCOOP powder Commonly known as: GLYCOLAX/MIRALAX Take 17 g by mouth daily. Hold for loose stool What changed:  when to take this reasons to take this   potassium chloride 10 MEQ tablet Commonly known as: KLOR-CON Take 2 tablets (20 mEq total) by mouth 2 (two) times daily. What changed:  how much to take when to take this   sacubitril-valsartan 49-51 MG Commonly known as: ENTRESTO Take 1 tablet by mouth 2 (two) times daily.   sertraline 25 MG tablet Commonly known as: ZOLOFT Take 1 tablet (25 mg total) by mouth daily.   spironolactone 25 MG tablet Commonly known as: ALDACTONE Take 0.5 tablets (12.5 mg total) by mouth daily. Start taking on: November 11, 2021   VITAMIN B-12 PO Take 1 capsule by mouth daily.   VITAMIN D2 PO Take 1 capsule by mouth daily. What changed: Another medication with the same name was changed. Make sure you understand how and when to take each.   Vitamin D (Ergocalciferol) 1.25 MG (50000 UNIT) Caps capsule Commonly known as: DRISDOL TAKE 1 CAPSULE BY MOUTH EVERY THURSDAY What changed: See the new instructions.   warfarin 6 MG tablet Commonly known as: COUMADIN Take as directed. If you are unsure how to take this medication, talk to your nurse or  doctor. Original instructions: Take 1 tablet (6 mg total) by mouth daily. Take 2-1 tablets daily. What changed:  how much to take when to take this additional instructions         Follow-up Information     MRedbyFollow up on 11/14/2021.   Specialty: Cardiology [facility]:30 pm Entrance C, Garage [address] Contact information: [phone]               Major procedures and Radiology Reports - PLEASE review detailed and final reports thoroughly  -      DG Chest 2 View  Result Date: 10/28/2021 CLINICAL DATA:  Shortness of breath and leg swelling EXAM: CHEST - 2 VIEW COMPARISON:  05/01/2016 FINDINGS: Cardiac shadow is enlarged but stable. Lungs are well aerated bilaterally. Minimal atelectatic changes are seen in the bases. No bony abnormality is noted. IMPRESSION: Mild bibasilar atelectasis. Electronically Signed   By: MInez CatalinaM.D.   On: 10/28/2021 22:18   ECHOCARDIOGRAM COMPLETE  Result Date: 10/29/2021    ECHOCARDIOGRAM REPORT   Patient Name:   JGERTRUDE TARBETDate of Exam: 10/29/2021 Medical Rec #:  [id number]        Height:       68.0 in Accession #:    [id number]       Weight:       202.2 lb Date of Birth:  1February 23, 1952       BSA:          2.053 m Patient Age:    89years  BP:           178/87 mmHg Patient Gender: M                 HR:           61 bpm. Exam Location:  Inpatient Procedure: 2D Echo Indications:    acute systolic chf  History:        Patient has prior history of Echocardiogram examinations, most                 recent 08/13/2020. Chronic kidney disease, Arrythmias:Atrial                 Fibrillation; Risk Factors:Diabetes and Hypertension.  Sonographer:    Johny Chess RDCS Referring Phys: [facility]  1. Left ventricular ejection fraction, by estimation, is 45 to  50%. The left ventricle has mildly decreased function. The left ventricle demonstrates regional wall motion abnormalities (see scoring diagram/findings for description). Left ventricular diastolic parameters are consistent with Grade II diastolic dysfunction (pseudonormalization). Elevated left atrial pressure. There is mild global left ventricular hypokinesis, slightly worse in the basal and mid segments of the inferior and inferolateral walls.  2. Right ventricular systolic function is moderately reduced. The right ventricular size is moderately enlarged. There is severely elevated pulmonary artery systolic pressure. The estimated right ventricular systolic pressure is 54.4 mmHg.  3. Left atrial size was moderately dilated.  4. Right atrial size was moderately dilated.  5. The mitral valve is normal in structure. No evidence of mitral valve regurgitation.  6. Tricuspid valve regurgitation is mild to moderate.  7. The aortic valve is tricuspid. Aortic valve regurgitation is not visualized. No aortic stenosis is present.  8. The inferior vena cava is dilated in size with <50% respiratory variability, suggesting right atrial pressure of 15 mmHg. Comparison(s): Prior images reviewed side by side. The overall left ventricular function is worsened. The left ventricular diastolic function is unchanged. The left ventricular wall motion abnormalities are unchanged. The striking changes involve marked worsening of the pulmonary hypertension, tricuspid insufficiency and right ventricular dysfunction. FINDINGS  Left Ventricle: Left ventricular ejection fraction, by estimation, is 45 to 50%. The left ventricle has mildly decreased function. The left ventricle demonstrates regional wall motion abnormalities. The left ventricular internal cavity size was normal in size. There is no left ventricular hypertrophy. Left ventricular diastolic parameters are consistent with Grade II diastolic dysfunction (pseudonormalization).  Elevated left atrial pressure.  LV Wall Scoring: The inferior wall and posterior wall are hypokinetic. There is mild global left ventricular hypokinesis, slightly worse in the basal and mid segments of the inferior and inferolateral walls. Right Ventricle: The right ventricular size is moderately enlarged. No increase in right ventricular wall thickness. Right ventricular systolic function is moderately reduced. There is severely elevated pulmonary artery systolic pressure. The tricuspid regurgitant velocity is 4.17 m/s, and with an assumed right atrial pressure of 15 mmHg, the estimated right ventricular systolic pressure is 92.0 mmHg. Left Atrium: Left atrial size was moderately dilated. Right Atrium: Right atrial size was moderately dilated. Pericardium: There is no evidence of pericardial effusion. Mitral Valve: The mitral valve is normal in structure. No evidence of mitral valve regurgitation. Tricuspid Valve: The tricuspid valve is grossly normal. Tricuspid valve regurgitation is mild to moderate. Aortic Valve: The aortic valve is tricuspid. Aortic valve regurgitation is not visualized. No aortic stenosis is present. Pulmonic Valve: The pulmonic valve was normal in structure. Pulmonic valve regurgitation is not visualized. Aorta:  The aortic root and ascending aorta are structurally normal, with no evidence of dilitation. Venous: The inferior vena cava is dilated in size with less than 50% respiratory variability, suggesting right atrial pressure of 15 mmHg. IAS/Shunts: No atrial level shunt detected by color flow Doppler.  LEFT VENTRICLE PLAX 2D LVIDd:         5.40 cm      Diastology LVIDs:         3.40 cm      LV e' medial:    4.68 cm/s LV PW:         1.10 cm      LV E/e' medial:  18.8 LV IVS:        0.90 cm      LV e' lateral:   8.05 cm/s LVOT diam:     2.30 cm      LV E/e' lateral: 10.9 LV SV:         65 LV SV Index:   32 LVOT Area:     4.15 cm  LV Volumes (MOD) LV vol d, MOD A2C: 116.0 ml LV vol s, MOD  A2C: 52.7 ml LV SV MOD A2C:     63.3 ml RIGHT VENTRICLE             IVC RV S prime:     12.00 cm/s  IVC diam: 2.50 cm TAPSE (M-mode): 1.7 cm LEFT ATRIUM             Index        RIGHT ATRIUM           Index LA diam:        4.80 cm 2.34 cm/m   RA Area:     21.50 cm LA Vol (A2C):   99.2 ml 48.31 ml/m  RA Volume:   57.30 ml  27.91 ml/m LA Vol (A4C):   81.8 ml 39.84 ml/m LA Biplane Vol: 90.6 ml 44.12 ml/m  AORTIC VALVE LVOT Vmax:   83.90 cm/s LVOT Vmean:  51.400 cm/s LVOT VTI:    0.156 m  AORTA Ao Root diam: 2.90 cm Ao Asc diam:  3.20 cm MITRAL VALVE               TRICUSPID VALVE MV Area (PHT): 3.60 cm    TR Peak grad:   69.6 mmHg MV Decel Time: 211 msec    TR Vmax:        417.00 cm/s MV E velocity: 87.90 cm/s MV A velocity: 28.40 cm/s  SHUNTS MV E/A ratio:  3.10        Systemic VTI:  0.16 m                            Systemic Diam: 2.30 cm Dani Gobble Croitoru MD Electronically signed by Sanda Klein MD Signature Date/Time: 10/29/2021/3:59:18 PM    Final     Micro Results    No results found for this or any previous visit (from the past 240 hour(s)).  Today   Subjective    Buel Molder feels much improved since admission.  Feels ready to go home.  Denies chest pain, shortness of breath or abdominal pain.  Feels they can take care of themselves with the resources they have at home.  Objective   Blood pressure (!) 154/89, pulse 79, temperature (!) 97.5 F (36.4 C), temperature source Oral, resp. rate 18, height '5\' 8"'  (1.727 m), weight 92.4 kg, SpO2 95 %.  Intake/Output Summary (Last 24 hours) at 11/10/2021 1158 Last data filed at 11/10/2021 1051 Gross per 24 hour  Intake 1800 ml  Output 2390 ml  Net -590 ml    Exam General: Patient appears well and in good spirits sitting up in bed in no acute distress.  Eyes: sclera anicteric, conjuctiva mild injection bilaterally CVS: S1-S2, regular  Respiratory:  decreased air entry bilaterally secondary to decreased inspiratory effort, rales at  bases  GI: NABS, soft, NT  LE: No edema.  Neuro: A/O x 3, Moving all extremities equally with normal strength, CN 3-12 intact, grossly nonfocal.  Psych: patient is logical and coherent, judgement and insight appear normal, mood and affect appropriate to situation.    Data Review   CBC w Diff:  Lab Results  Component Value Date   WBC 7.6 11/10/2021   HGB 13.2 11/10/2021   HCT 42.6 11/10/2021   PLT 234 11/10/2021   LYMPHOPCT 15 10/28/2021   MONOPCT 9 10/28/2021   EOSPCT 4 10/28/2021   BASOPCT 1 10/28/2021    CMP:  Lab Results  Component Value Date   NA 137 11/10/2021   K 4.4 11/10/2021   CL 107 11/10/2021   CO2 24 11/10/2021   BUN 27 (H) 11/10/2021   CREATININE 1.67 (H) 11/10/2021   CREATININE 1.18 06/13/2021   PROT 7.3 10/28/2021   ALBUMIN 3.6 10/28/2021   BILITOT 1.1 10/28/2021   ALKPHOS 41 10/28/2021   AST 23 10/28/2021   ALT 35 10/28/2021  .   Total Time in preparing paper work, data evaluation and todays exam - 35 minutes  Vashti Hey M.D on 11/10/2021 at 11:58 AM  [facility]

## 2021-11-10 NOTE — Progress Notes (Signed)
PT Cancellation Note  Patient Details Name: [name] MRN: [id number] DOB: 02-Mar-1951   Cancelled Treatment:    Reason Eval/Treat Not Completed: Other (comment).  Initially refused PT as he is awaiting his daughter to pick him up and then was on phone with her when PT checked back.  Follow up as time and pt allow.   Ivar Drape 11/10/2021, 3:51 PM  Samul Dada, PT PhD Acute Rehab Dept. Number: [facility] [id number] and [facility] [phone]

## 2021-11-10 NOTE — Plan of Care (Signed)
  Problem: Activity: Goal: Capacity to carry out activities will improve Outcome: Progressing   Problem: Clinical Measurements: Goal: Respiratory complications will improve Outcome: Progressing   

## 2021-11-10 NOTE — TOC Initial Note (Signed)
Transition of Care [facility]) - Initial/Assessment Note    Patient Details  Name: [name] MRN: [id number] Date of Birth: 05-07-1951  Transition of Care [facility]) CM/SW Contact:    Erenest Rasher, RN Phone Number: [phone] 11/10/2021, 1:12 PM  Clinical Narrative:                  HF TOC CM spoke to pt and lives with daughter, Margaretha Sheffield. States he has RW and cane at home. His dtr takes him to his appts. [place] and his meds have $0 copay for meds. States his dtr assist him with meds.    Expected Discharge Plan: Home/Self Care Barriers to Discharge: No Barriers Identified   Patient Goals and CMS Choice Patient states their goals for this hospitalization and ongoing recovery are:: wants to get stronger      Expected Discharge Plan and Services Expected Discharge Plan: Home/Self Care   Discharge Planning Services: CM Consult   Living arrangements for the past 2 months: Single Family Home Expected Discharge Date: 11/10/21                 Prior Living Arrangements/Services Living arrangements for the past 2 months: Single Family Home Lives with:: Adult Children Patient language and need for interpreter reviewed:: No Do you feel safe going back to the place where you live?: Yes      Need for Family Participation in Patient Care: Yes (Comment) Care giver support system in place?: Yes (comment) Current home services: DME (rolling walker, cane) Criminal Activity/Legal Involvement Pertinent to Current Situation/Hospitalization: No - Comment as needed  Activities of Daily Living Home Assistive Devices/Equipment: Cane (specify quad or straight) (straight cane) ADL Screening (condition at time of admission) Patient's cognitive ability adequate to safely complete daily activities?: Yes Is the patient deaf or have difficulty hearing?: No Does the patient have difficulty seeing, even when wearing glasses/contacts?: No Does the patient have difficulty  concentrating, remembering, or making decisions?: No Patient able to express need for assistance with ADLs?: Yes Does the patient have difficulty dressing or bathing?: No Independently performs ADLs?: Yes (appropriate for developmental age) Does the patient have difficulty walking or climbing stairs?: Yes Weakness of Legs: Both Weakness of Arms/Hands: None  Permission Sought/Granted Permission sought to share information with : Case Manager, Family Supports, PCP Permission granted to share information with : Yes, Verbal Permission Granted  Share Information with NAME: Billyjoe Humphres     Permission granted to share info w Relationship: daughter  Permission granted to share info w Contact Information: [phone]  Emotional Assessment   Attitude/Demeanor/Rapport: Engaged Affect (typically observed): Accepting Orientation: : Oriented to Self, Oriented to Place, Oriented to  Time, Oriented to Situation   Psych Involvement: No (comment)  Admission diagnosis:  Acute on chronic combined systolic and diastolic CHF (congestive heart failure) (HCC) [I50.43] Acute on chronic combined systolic (congestive) and diastolic (congestive) heart failure (HCC) [I50.43] Patient Active Problem List   Diagnosis Date Noted   Acute on chronic combined systolic (congestive) and diastolic (congestive) heart failure ([place]) 10/31/2021   Pressure injury of skin 10/30/2021   Acute on chronic combined systolic and diastolic CHF (congestive heart failure) ([place]) 10/29/2021   Type 1 diabetes mellitus with complications ([place]) [id number]   Neurocognitive deficits 06/20/2021   Hepatitis C antibody test positive 06/19/2021   PVD (peripheral vascular disease) ([place]) 06/02/2021   Adjustment disorder, unspecified 05/24/2021   Toe necrosis ([place]) 05/16/2021   Acute-on-chronic kidney injury ([place])  05/16/2021   CHF (congestive heart failure) ([place]) 05/16/2021   Atrial fibrillation ([place]) 05/16/2021   Neck abscess 05/14/2016    Palliative care encounter 03/18/2016   Atrial fibrillation with rapid ventricular response ([place]) 03/14/2016   Noncompliance with medication regimen 03/14/2016   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   Hypertensive heart disease with heart failure (HCC)    Elevated troponin    Atrial flutter ([place]) 12/28/2015   Encounter for therapeutic drug monitoring 05/24/2014   HTN (hypertension) [id number]   Chronic systolic heart failure ([place]) 05/15/2014   Obstructive sleep apnea 05/13/2014   Swelling of lower extremity 05/09/2014   Depression 05/09/2014   Type 2 diabetes mellitus with stage 3 chronic kidney disease, with long-term current use of insulin ([place]) 05/09/2014   CKD (chronic kidney disease), stage III ([place]) 05/09/2014   Obesity (BMI 30-39.9) 05/09/2014   Ascites 05/09/2014   PCP:  Sandrea Hughs, NP Pharmacy:   [facility] [place], [place] [address] [place] [address] Phone: [phone] Fax: [phone]  [facility] ([facility]) - [place], [place] [place] [place] [address] Phone: [phone] Fax: [phone]  [facility] [address] [place] [address] Phone: [phone] Fax: [phone]     Social Determinants of Health (SDOH) Interventions    Readmission Risk Interventions No flowsheet data found.

## 2021-11-10 NOTE — Progress Notes (Signed)
IV removed, teli d/c pt ready for d/c. Awaiting on daughter to come, stated will be here around 5 pm

## 2021-11-10 NOTE — Progress Notes (Signed)
ANTICOAGULATION CONSULT NOTE - Follow Up Consult  Pharmacy Consult for warfarin Indication: atrial fibrillation  No Known Allergies  Patient Measurements: Height: 5\' 8"  (172.7 cm) Weight: 92.4 kg (203 lb 9.6 oz) IBW/kg (Calculated) : 68.4  Vital Signs: Temp: 97.6 F (36.4 C) (01/02 0353) Temp Source: Oral (01/02 0353) BP: 141/79 (01/02 0833) Pulse Rate: 87 (01/02 0833)  Labs: Recent Labs    11/08/21 0417 11/09/21 0317 11/10/21 0147  HGB 12.6* 12.7* 13.2  HCT 40.7 41.7 42.6  PLT 222 224 234  LABPROT 24.2* 24.3* 25.1*  INR 2.2* 2.2* 2.3*  CREATININE 1.60* 1.57* 1.67*     Estimated Creatinine Clearance: 44.8 mL/min (A) (by C-G formula based on SCr of 1.67 mg/dL (H)).  Assessment: 71 yo male admitted for acute on chronic combined systolic and diastolic CHF. He has a history of a fib and was on warfarin prior to admission. Pt s/p TEE/DCCV 12/29.  INR remains therapeutic at 2.3, CBC stable.   PTA dose: 3mg  qTues/Sat, 6mg  all other days, Admit INR 1.9   Goal of Therapy:  INR 2-3 Monitor platelets by anticoagulation protocol: Yes   Plan:  Warfarin 6mg  x1 today INR daily  1/30, PharmD, BCPS, [facility] Clinical Pharmacist [phone] Please check AMION for all [facility] numbers 11/10/2021

## 2021-11-10 NOTE — Progress Notes (Signed)
Cardiology Progress Note  Patient ID: [name] MRN: [id number] DOB: [birth year] Date of Encounter: 11/10/2021  Primary Cardiologist: None  Subjective   Chief Complaint: None.   HPI: Euvolemic.  Back in A. fib.  Rate controlled on amiodarone.  Beta-blocker stopped due to bradycardia.  ROS:  All other ROS reviewed and negative. Pertinent positives noted in the HPI.     Inpatient Medications  Scheduled Meds:  amiodarone  200 mg Oral Daily   docusate sodium  100 mg Oral BID   empagliflozin  10 mg Oral Daily   insulin aspart  0-15 Units Subcutaneous TID WC   insulin aspart  0-5 Units Subcutaneous QHS   insulin glargine-yfgn  15 Units Subcutaneous Daily   multivitamin with minerals  1 tablet Oral Daily   Ensure Max Protein  11 oz Oral BID   sacubitril-valsartan  1 tablet Oral BID   sodium chloride flush  3 mL Intravenous Q12H   spironolactone  12.5 mg Oral Daily   triamcinolone 0.1 % cream : eucerin   Topical BID   Warfarin - Pharmacist Dosing Inpatient   Does not apply q1600   Continuous Infusions:  PRN Meds: acetaminophen **OR** [DISCONTINUED] acetaminophen, bisacodyl, hydrALAZINE, ondansetron **OR** ondansetron (ZOFRAN) IV, polyethylene glycol, traZODone   Vital Signs   Vitals:   11/09/21 1212 11/09/21 1617 11/09/21 2040 11/10/21 0353  BP: (!) 170/80 (!) 162/79 (!) 169/92 (!) 158/86  Pulse: 85 75 70 79  Resp: '20 19 17 17  ' Temp: 98 F (36.7 C) 97.7 F (36.5 C) 97.8 F (36.6 C) 97.6 F (36.4 C)  TempSrc: Oral Oral Oral Oral  SpO2: 95% 95% 98% 93%  Weight:    92.4 kg  Height:        Intake/Output Summary (Last 24 hours) at 11/10/2021 0828 Last data filed at 11/10/2021 0819 Gross per 24 hour  Intake 2400 ml  Output 2470 ml  Net -70 ml   Last 3 Weights 11/10/2021 11/09/2021 11/07/2021  Weight (lbs) 203 lb 9.6 oz 203 lb 12.8 oz 205 lb 4.8 oz  Weight (kg) 92.352 kg 92.443 kg 93.123 kg  Some encounter information is confidential and restricted. Go to Review  Flowsheets activity to see all data.      Telemetry  Overnight telemetry shows A. fib 90-100 bpm, which I personally reviewed.   Physical Exam   Vitals:   11/09/21 1212 11/09/21 1617 11/09/21 2040 11/10/21 0353  BP: (!) 170/80 (!) 162/79 (!) 169/92 (!) 158/86  Pulse: 85 75 70 79  Resp: '20 19 17 17  ' Temp: 98 F (36.7 C) 97.7 F (36.5 C) 97.8 F (36.6 C) 97.6 F (36.4 C)  TempSrc: Oral Oral Oral Oral  SpO2: 95% 95% 98% 93%  Weight:    92.4 kg  Height:        Intake/Output Summary (Last 24 hours) at 11/10/2021 0828 Last data filed at 11/10/2021 6160 Gross per 24 hour  Intake 2400 ml  Output 2470 ml  Net -70 ml    Last 3 Weights 11/10/2021 11/09/2021 11/07/2021  Weight (lbs) 203 lb 9.6 oz 203 lb 12.8 oz 205 lb 4.8 oz  Weight (kg) 92.352 kg 92.443 kg 93.123 kg  Some encounter information is confidential and restricted. Go to Review Flowsheets activity to see all data.    Body mass index is 30.96 kg/m.   General: Well nourished, well developed, in no acute distress Head: Atraumatic, normal size  Eyes: PEERLA, EOMI  Neck: Supple, no JVD Endocrine: No thryomegaly  Cardiac: Normal S1, S2; irregular rhythm, no murmurs rubs or gallops Lungs: Diminished breath sounds bilaterally Abd: Soft, nontender, no hepatomegaly  Ext: No edema, pulses 2+ Musculoskeletal: No deformities, BUE and BLE strength normal and equal Skin: Warm and dry, no rashes   Neuro: Alert and oriented to person, place, time, and situation, CNII-XII grossly intact, no focal deficits  Psych: Normal mood and affect   Labs  High Sensitivity Troponin:  No results for input(s): TROPONINIHS in the last 720 hours.   Cardiac EnzymesNo results for input(s): TROPONINI in the last 168 hours. No results for input(s): TROPIPOC in the last 168 hours.  Chemistry Recent Labs  Lab 11/08/21 0417 11/09/21 0317 11/10/21 0147  NA 138 138 137  K 4.5 4.5 4.4  CL 102 102 107  CO2 '27 29 24  ' GLUCOSE 122* 105* 119*  BUN 35* 31* 27*   CREATININE 1.60* 1.57* 1.67*  CALCIUM 9.0 8.9 8.8*  GFRNONAA 46* 47* 43*  ANIONGAP '9 7 6    ' Hematology Recent Labs  Lab 11/08/21 0417 11/09/21 0317 11/10/21 0147  WBC 8.8 7.5 7.6  RBC 4.47 4.57 4.75  HGB 12.6* 12.7* 13.2  HCT 40.7 41.7 42.6  MCV 91.1 91.2 89.7  MCH 28.2 27.8 27.8  MCHC 31.0 30.5 31.0  RDW 16.1* 15.6* 15.5  PLT 222 224 234   BNPNo results for input(s): BNP, PROBNP in the last 168 hours.  DDimer No results for input(s): DDIMER in the last 168 hours.   Radiology  No results found.  Cardiac Studies  TTE 10/29/2021  1. Left ventricular ejection fraction, by estimation, is 45 to 50%. The  left ventricle has mildly decreased function. The left ventricle  demonstrates regional wall motion abnormalities (see scoring  diagram/findings for description). Left ventricular  diastolic parameters are consistent with Grade II diastolic dysfunction  (pseudonormalization). Elevated left atrial pressure. There is mild global  left ventricular hypokinesis, slightly worse in the basal and mid segments  of the inferior and  inferolateral walls.   2. Right ventricular systolic function is moderately reduced. The right  ventricular size is moderately enlarged. There is severely elevated  pulmonary artery systolic pressure. The estimated right ventricular  systolic pressure is 21.1 mmHg.   3. Left atrial size was moderately dilated.   4. Right atrial size was moderately dilated.   5. The mitral valve is normal in structure. No evidence of mitral valve  regurgitation.   6. Tricuspid valve regurgitation is mild to moderate.   7. The aortic valve is tricuspid. Aortic valve regurgitation is not  visualized. No aortic stenosis is present.   8. The inferior vena cava is dilated in size with <50% respiratory  variability, suggesting right atrial pressure of 15 mmHg.   Patient Profile  [name] is a 71 y.o. male with systolic heart failure with recovery of ejection  fraction, CKD stage III, LV thrombus with resolution, hypertension, diabetes who was admitted on 10/29/2021 with acute on chronic systolic heart failure.  Course has been complicated by atrial fibrillation status post TEE/cardioversion.  Back in A. fib but euvolemic.  Assessment & Plan   #Acute on chronic systolic heart failure, EF 35% -History of reduced ejection fraction 20% in 2017.  EF did recover up to 55-60% in October 2021. -No symptoms of angina but has refused cardiac cath multiple times. -He has been effectively diuresed.  Euvolemic. -Net -19.5 L since admission. -Kidney function is stable.  Would recommend to continue Entresto 49-51 mg twice  daily.  Continue Jardiance 10 mg daily.  He is on Aldactone 12.5 mg daily. -Hold beta-blocker due to bradycardia.  Need for amiodarone discussed below. -Resume Lasix 20 mg twice daily at discharge. -We discussed salt reduction.  This was the big problem with his recurrence of heart failure.  He will work on this.  #Persistent atrial fibrillation #Sinus bradycardia/sick sinus syndrome -Status post TEE/cardioversion on 11/06/2021. -Back in atrial fibrillation. -Beta-blocker held due to bradycardia.  Concerns for sick sinus syndrome. -For now we will just continue amiodarone.  He will likely be considered for pacemaker therapy as an outpatient. -I will increase his amiodarone to 200 mg daily.  Hopefully he will convert back to sinus rhythm on this. -Continue Coumadin given history of LV thrombus.  Goal INR 2-3  #CKD stage III -eGFR stable.  Continue heart failure medications.  #Prior LV thrombus -Continue warfarin.  [facility] will sign off.   Medication Recommendations: As above Other recommendations (labs, testing, etc): None Follow up as an outpatient: We will arrange 1 to 2 weeks follow-up in the advanced heart failure clinic with Dr. Pierre Bali.  For questions or updates, please contact [place] Please consult  [email or web address] for contact info under   Time Spent with Patient: I have spent a total of 25 minutes with patient reviewing hospital notes, telemetry, EKGs, labs and examining the patient as well as establishing an assessment and plan that was discussed with the patient.  > 50% of time was spent in direct patient care.    Signed, Addison Naegeli. Audie Box, MD, [place]  11/10/2021 8:28 AM

## 2021-11-10 NOTE — Progress Notes (Signed)
HR up to 130. Came to check on the pt and he stated he was exercising Advised patient to rest and not to push himself too hard. Pt resting HR down.

## 2021-11-10 NOTE — Progress Notes (Signed)
Mobility Specialist Progress Note:   11/10/21 1322  Mobility  Activity Ambulated in room  Level of Assistance Standby assist, set-up cues, supervision of patient - no hands on  Assistive Device None  Distance Ambulated (ft) 40 ft  Mobility Ambulated with assistance in room  Mobility Response Tolerated well  Mobility performed by Mobility specialist  $Mobility charge 1 Mobility   No complaints of pain.Left in bed with call bell in reach and all needs met.  [facility] Public librarian Phone [phone] Secondary Phone [phone]

## 2021-11-11 ENCOUNTER — Other Ambulatory Visit: Payer: Self-pay

## 2021-11-11 ENCOUNTER — Telehealth: Payer: Self-pay | Admitting: *Deleted

## 2021-11-11 DIAGNOSIS — N1832 Chronic kidney disease, stage 3b: Secondary | ICD-10-CM

## 2021-11-11 DIAGNOSIS — I4891 Unspecified atrial fibrillation: Secondary | ICD-10-CM

## 2021-11-11 DIAGNOSIS — I5022 Chronic systolic (congestive) heart failure: Secondary | ICD-10-CM

## 2021-11-11 DIAGNOSIS — E1122 Type 2 diabetes mellitus with diabetic chronic kidney disease: Secondary | ICD-10-CM

## 2021-11-11 NOTE — Telephone Encounter (Signed)
Transition Care Management Unsuccessful Follow-up Telephone Call  Date of discharge and from where:  11/10/2021 [place]  Attempts:  1st Attempt  Reason for unsuccessful TCM follow-up call:  Left voice message to return call.

## 2021-11-11 NOTE — Patient Outreach (Signed)
[facility]) Care Management  11/11/2021  [name] 22-Feb-1951 [id number]   Hospital referral received from Charlesetta Shanks for [facility] RN Care Coordinator for complex care and disease management. Assigned to Antionette Fairy, RN Care Coordinator for follow up.  Thank you, Vanice Sarah [facility] Care Management Assistant

## 2021-11-12 ENCOUNTER — Other Ambulatory Visit: Payer: Self-pay

## 2021-11-12 ENCOUNTER — Ambulatory Visit (INDEPENDENT_AMBULATORY_CARE_PROVIDER_SITE_OTHER): Payer: Medicare HMO

## 2021-11-12 DIAGNOSIS — Z5181 Encounter for therapeutic drug level monitoring: Secondary | ICD-10-CM

## 2021-11-12 DIAGNOSIS — I483 Typical atrial flutter: Secondary | ICD-10-CM

## 2021-11-12 DIAGNOSIS — I4891 Unspecified atrial fibrillation: Secondary | ICD-10-CM

## 2021-11-12 DIAGNOSIS — I513 Intracardiac thrombosis, not elsewhere classified: Secondary | ICD-10-CM | POA: Diagnosis not present

## 2021-11-12 LAB — POCT INR: INR: 3.5 — AB (ref 2.0–3.0)

## 2021-11-12 NOTE — Patient Instructions (Addendum)
Description   Do not take any Warfarin today then START taking Warfarin 1 tablet daily except 1/2 tablet on Tuesdays, Thursdays and Saturdays. Recheck INR in 2 weeks. Main # [phone] Coumadin Clinic [phone].  Amio increased to 200mg  daily

## 2021-11-12 NOTE — Patient Outreach (Signed)
[facility]) Care Management  11/12/2021  [name] Sep 02, 1951 [id number]     Transition of Care Referral  Referral Date: 11/11/2021 Referral Source: [facility] Liaison Date of Discharge: 11/10/2021 Facility: [facility] PCP Office Does [facility]    Outreach attempt # 1 to patient.No answer. RN CM left HIPAA compliant voicemail message along with contact info.    Plan: RN CM will make outreach attempt to patient within 3-4 business days. RN CM will send unsuccessful outreach letter to patient.  Antionette Fairy, RN,BSN,CCM [facility] Care Management Telephonic Care Management Coordinator Direct Phone: [phone] Toll Free: [phone] Fax: [phone]

## 2021-11-12 NOTE — Telephone Encounter (Signed)
Transition Care Management Unsuccessful Follow-up Telephone Call  Date of discharge and from where:  11/10/2021 [place]  Attempts:  2nd Attempt  Reason for unsuccessful TCM follow-up call:  Unable to leave message

## 2021-11-13 ENCOUNTER — Telehealth (HOSPITAL_COMMUNITY): Payer: Self-pay

## 2021-11-13 NOTE — Telephone Encounter (Signed)
Called and left patient a detailed message to confirm/remind patient of their appointment at the Advanced Heart Failure Clinic on 11/14/21.

## 2021-11-13 NOTE — Progress Notes (Signed)
Patient ID: [name], male   DOB: 1951/03/25, 71 y.o.   MRN: [id number]    Advanced Heart Failure Clinic Note   PCP: [facility] Primary Cardiologist: Dr. Claiborne Billings  HPI: [name] is a 71 yo male with PAF, severe depression, DM2, CKD stage III, obesity, OSA and chronic systolic HF.  Admitted 6/30-05/16/14 with dyspena and LE edema. Found to be in afib with RVR. Diuresed with IV lasix and d/c weight 234 lbs.   Admitted 2/18 -01/10/2016 wit volume overload/Afib RVR  in the setting of medication compliance. Diuresed with IV lasix and transitioned to lasix 80 mg twice a day. Had TEE but  DC-CV was cancelled due to LA thrombus. Discharge weight 189 pounds.   Admitted 03/14/2016 with  A fib RVR and volume overload in the setting of medication noncompliance. Refused cath, Myoview 12/2015. EF 20% with defects noted on rest and stress images within the inferior wall and to lesser extent the septum suggest ischemic etiology. No reversibility. Restarted on HF meds. D/C with volume overload. Psychiatry met with him and he was deemed to have the ability to make decisions. Diuresed with IV lasix and transitioned to lasix 80 mg twice a day. Discharge weight was 216 pounds.   Echo 5/17 EF 15-20%  He was last seen in the HF clinic in 2021. EF had normalized.     Admitted 10/28/21 with increased shortness of breath and marked volume overload.  Admit weight 231 pounds. His medications were being delivered in bubbles packs and he was taking metoprolol once a day instead of twice a day and his diuretic regimen was lower. Echo repeated EF 45-50%. Placed on IV lasix and diuresed > 19 liters. Transitioned to lasix 20 mg daily. Hospital course complicated by AKI and A fib. Underwent TEE-DC-CV with conversion to NSR. Beta blocker held due to bradycardia. He was continued on amiodarone 200 mg daily. He was back in A fib on discharge.  Discharge weight 203 pounds.   Today he returns for post hospital  follow up. Overall feeling fine. Denies SOB/PND/Orthopnea. Appetite ok. No fever or chills. Weighing at home 4 days a week . Last weight at home was 200 pounds. Says he will not weight everyday. He is unsure which medications he is taking. Says his daughter is helping him with meds. He also tells me he wont be taking these medications for long.    Cardiac Testing  TEE EF 35% No PFO . No thrombus.   Echo 10/29/21 EF 45-50% Echo 2021 EF 55-60 Echo 5/17 EF 15-20%  11/06/21 DC-CV--> NSR   Myoview 12/31/15  Perfusion: Next defects noted on rest and stress images within the inferior wall and to lesser extent the septum. No reversible defects. Wall Motion: Severe generalized hypokinesia. Left ventricle is dilute Left Ventricular Ejection Fraction: 20 % End diastolic volume 409 ml End systolic volume 811 ml IMPRESSION: 1. 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 No evidence of ischemia. 2. Severe generalized hypokinesia with dilated left ventricle. 3. Left ventricular ejection fraction 20% 4. High-risk stress test findings*.  ROS: All systems negative except as listed in HPI, PMH and Problem List.  SH:  Social History   Socioeconomic History   Marital status: Single    Spouse name: Not on file   Number of children: Not on file   Years of education: Not on file   Highest education level: Not on file  Occupational History   Occupation: retired  Tobacco Use   Smoking status: Some Days    Years: 52.00    Types: Cigarettes, Cigars    Last attempt to quit: 05/2021    Years since quitting: 0.5   Smokeless tobacco: Never  Substance and Sexual Activity   Alcohol use: Not Currently   Drug use: Never   Sexual activity: Not on file  Other Topics Concern   Not on file  Social History Narrative   ** Merged History Encounter ** Lives in [place] by himself. Retired from [employer] and [employer] for [employer].       Tobacco use, amount per day now:    Past tobacco use, amount per day:   How many years did you use tobacco:   Alcohol use (drinks per week): N/A   Diet:   Do you drink/eat things with caffeine:   Marital status:  Divorced                                What year were you married? 2003   Do you live in a house, apartment, assisted living, condo, trailer, etc.? House   Is it one or more stories? One   How many persons live in your home?   Do you have pets in your home?( please list) N/A   Highest Level of education completed? Bachelors Degree   Current or past profession: Teacher-Special Ed   Do you exercise?  Yes                                Type and how often? Daily squats, and push ups.   Do you have a living will? No   Do you have a DNR form?  No                                 If not, do you want to discuss one?   Do you have signed POA/HPOA forms?  No                      If so, please bring to you appointment      Do you have any difficulty bathing or dressing yourself? Bathing only if pants not shirts/not shorts.   Do you have any difficulty preparing food or eating? No   Do you have any difficulty managing your medications? No   Do you have any difficulty managing your finances? No   Do you have any difficulty affording your medications? No   Social Determinants of Radio broadcast assistant Strain: Not on file  Food Insecurity: No Food Insecurity   Worried About Charity fundraiser in the Last Year: Never true   Ran Out of Food in the Last Year: Never true  Transportation Needs: No Transportation Needs   Lack of Transportation (Medical): No   Lack of Transportation (Non-Medical): No  Physical Activity: Not on file  Stress: Not on file  Social Connections: Not on file  Intimate Partner Violence: Not on file    FH:  Family History  Problem Relation Age of Onset   Heart attack Mother        deceased   Diabetes Mother    Heart attack Sister     Past Medical History:  Diagnosis Date   A-fib ([place])  a. (06/04/14) TEE-DC-CV; succesful; large LA 6.2 cm   ADHD    Ascites    Athlete's foot    Chronic systolic heart failure ([place])    a. 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, unspecified CKD stage, unspecified whether long term insulin use (HCC)    Heart murmur    HTN (hypertension)    OSA (obstructive sleep apnea)    PVD (peripheral vascular disease) (HCC)    s/p great toe amputation    Current Outpatient Medications  Medication Sig Dispense Refill   amiodarone (PACERONE) 200 MG tablet Take 1 tablet (200 mg total) by mouth daily. 30 tablet 0   blood glucose meter kit and supplies Dispense based on patient and insurance preference. Use up to four times daily as directed. (FOR ICD-10 E10.9, E11.9). 1 each 0   Cyanocobalamin (VITAMIN B-12 PO) Take 1 capsule by mouth daily.     diclofenac Sodium (VOLTAREN) 1 % GEL Apply 2 g topically 4 (four) times daily. (Patient taking differently: Apply 2 g topically 4 (four) times daily as needed (arthritis pain).) 100 g 1   empagliflozin (JARDIANCE) 10 MG TABS tablet Take 1 tablet (10 mg total) by mouth daily. 30 tablet 0   Ergocalciferol (VITAMIN D2 PO) Take 1 capsule by mouth daily.     furosemide (LASIX) 20 MG tablet Take 1 tablet (20 mg total) by mouth 2 (two) times daily. May take an additional 59m daily as needed. 60 tablet 3   Green Tea, Camellia sinensis, (GREEN TEA EXTRACT PO) Take 1 Dose by mouth daily.     insulin glargine (LANTUS) 100 UNIT/ML injection Inject 0.08 mLs (8 Units total) into the skin daily. 10 mL 1   KRILL OIL OMEGA-3 PO Take 1 tablet by mouth daily.     polyethylene glycol powder (GLYCOLAX/MIRALAX) 17 GM/SCOOP powder Take 17 g by mouth daily. Hold for loose stool (Patient taking differently: Take 17 g by mouth daily as  needed for mild constipation. Hold for loose stool) 3350 g 1   sacubitril-valsartan (ENTRESTO) 49-51 MG Take 1 tablet by mouth 2 (two) times daily. 60 tablet 0   potassium chloride (KLOR-CON 10) 10 MEQ tablet Take 2 tablets (20 mEq total) by mouth 2 (two) times daily. (Patient taking differently: Take 10 mEq by mouth daily.) 180 tablet 1   Sodium Hyaluronate, oral, (HYALURONIC ACID PO) Take 1 tablet by mouth daily.     spironolactone (ALDACTONE) 25 MG tablet Take 0.5 tablet (12.5 mg total) by mouth daily. 15 tablet 0   Vitamin D, Ergocalciferol, (DRISDOL) 1.25 MG (50000 UNIT) CAPS capsule TAKE 1 CAPSULE BY MOUTH EVERY THURSDAY (Patient taking differently: Take 50,000 Units by mouth once a week.) 12 capsule 1   warfarin (COUMADIN) 6 MG tablet Take 1 tablet (6 mg total) by mouth daily. Take 2-1 tablets daily. (Patient taking differently: Take 3-6 mg by mouth See admin instructions. Take 38mon Tuesdays and Saturdays, then 61m25mn other days.) 90 tablet 0   No current facility-administered medications for this encounter.    Vitals:   11/14/21 1448  BP: (!) 148/74  Pulse: 93  SpO2: 97%  Weight: 95.4 kg     Wt Readings from Last 3 Encounters:  11/14/21 95.4 kg  11/10/21 92.4 kg  06/26/21 91.7  kg  Reds Clip 27%   PHYSICAL EXAM: General:  Well appearing. No resp difficulty,. Walked in the clinic.  HEENT: normal Neck: supple. no JVD. Carotids 2+ bilat; no bruits. No lymphadenopathy or thryomegaly appreciated. Cor: PMI nondisplaced. Irregular rate & rhythm. No rubs, gallops or murmurs. Lungs: clear Abdomen: obese soft, nontender, nondistended. No hepatosplenomegaly. No bruits or masses. Good bowel sounds. Extremities: no cyanosis, clubbing, rash, edema. R and LLE chronic hypigmentation Neuro: alert & orientedx3, cranial nerves grossly intact. moves all 4 extremities w/o difficulty. Affect pleasant  ECG: A Flutter versus Atach 89 bpm personally reviewed  ASSESSMENT & PLAN:  1. Chronic  HFmEF - Admitted with marked volume overload. Not taking medications as prescribed. - Myoview 12/2015. EF 20% with defects noted on rest and stress images within the inferior wall and to lesser extent the septum suggest ischemic etiology. No reversibility. Refused cath multiple times. No s/s angina currently. HsTrop not checked on admit. ECG with nonspecific inferolateral TW abnormalities - Echo 10/21 EF 55-60%--->- 10/30/21 Echo  EF 45-50%, RV moderately reduced, RVSP 85 mmHg - NYHA II . Reds Clip 27%. Continue lasix 20 mg twice a day.  - Continue entresto 49-51 mg twice a day  - Continue Jardiance 10 mg daily - Taken off bb during recent hospitalization due to bradycardia  - Continue spironolactone 12.5 mg daily - Check BMET    2. CKD Stage IIIa-IIIb - Creatinine 1.5-1.7  -Check BMET    3. DMII - Hgb A1C 8.2. On SSI - Continue Jardiance   4. HTN - Elevated  - Does not which medications he is taking will not change.    5. 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  - EKG today. A flutter 89 bpm - Discussed referral to A fib however he declined.   - Continue amio  - On coumadin     6.  H/O LV Thrombus - resolved - on warfrain for Afib/flutter   Follow up in 6 weeks. Hard to know how to adjust medications given he has no idea which medications he is taking.   Discussed Paramedicine for medication management. He is agreeable to a few visits. Darrick Grinder NP-C  2:58 PM

## 2021-11-13 NOTE — Telephone Encounter (Signed)
Transition Care Management Follow-up Telephone Call Date of discharge and from where: 11/10/2021 [place] How have you been since you were released from the hospital? better Any questions or concerns? No  Items Reviewed: Did the pt receive and understand the discharge instructions provided? Yes  Medications obtained and verified? Yes  Other? No  Any new allergies since your discharge? No  Dietary orders reviewed? Yes Do you have support at home? Yes   Home Care and Equipment/Supplies: Were home health services ordered? no If so, what is the name of the agency? na  Has the agency set up a time to come to the patient's home? not applicable Were any new equipment or medical supplies ordered?  No What is the name of the medical supply agency? na Were you able to get the supplies/equipment? not applicable Do you have any questions related to the use of the equipment or supplies? No  Functional Questionnaire: (I = Independent and D = Dependent) ADLs: I  Bathing/Dressing- I  Meal Prep- D  Eating- I  Maintaining continence- I  Transferring/Ambulation- I with assistance Cane  Managing Meds- D  Follow up appointments reviewed:  PCP Hospital f/u appt confirmed? Yes  Scheduled to see Dinah on 11/17/2021 @ 3:45. Specialist Hospital f/u appt confirmed? Yes  Scheduled to see Cardiologist on 11/14/2021  Are transportation arrangements needed? No  If their condition worsens, is the pt aware to call PCP or go to the Emergency Dept.? Yes Was the patient provided with contact information for the PCP's office or ED? Yes Was to pt encouraged to call back with questions or concerns? Yes

## 2021-11-13 NOTE — Telephone Encounter (Signed)
Transition Care Management Unsuccessful Follow-up Telephone Call  Date of discharge and from where:  11/10/2021 [facility]  Attempts:  3rd Attempt  Reason for unsuccessful TCM follow-up call:  Left voice message to return call.

## 2021-11-14 ENCOUNTER — Other Ambulatory Visit: Payer: Self-pay

## 2021-11-14 ENCOUNTER — Encounter (HOSPITAL_COMMUNITY): Payer: Self-pay

## 2021-11-14 ENCOUNTER — Telehealth: Payer: Self-pay | Admitting: *Deleted

## 2021-11-14 ENCOUNTER — Ambulatory Visit (HOSPITAL_COMMUNITY)
Admit: 2021-11-14 | Discharge: 2021-11-14 | Disposition: A | Discharge status: Home / Self Care | Payer: Medicare HMO | Attending: Family Medicine | Admitting: Family Medicine

## 2021-11-14 VITALS — BP 148/74 | HR 93 | Wt 210.4 lb

## 2021-11-14 DIAGNOSIS — I10 Essential (primary) hypertension: Secondary | ICD-10-CM | POA: Diagnosis not present

## 2021-11-14 DIAGNOSIS — I4892 Unspecified atrial flutter: Secondary | ICD-10-CM | POA: Insufficient documentation

## 2021-11-14 DIAGNOSIS — R001 Bradycardia, unspecified: Secondary | ICD-10-CM | POA: Diagnosis not present

## 2021-11-14 DIAGNOSIS — E1122 Type 2 diabetes mellitus with diabetic chronic kidney disease: Secondary | ICD-10-CM | POA: Insufficient documentation

## 2021-11-14 DIAGNOSIS — I5022 Chronic systolic (congestive) heart failure: Secondary | ICD-10-CM | POA: Diagnosis not present

## 2021-11-14 DIAGNOSIS — I480 Paroxysmal atrial fibrillation: Secondary | ICD-10-CM | POA: Diagnosis not present

## 2021-11-14 DIAGNOSIS — Z86718 Personal history of other venous thrombosis and embolism: Secondary | ICD-10-CM | POA: Insufficient documentation

## 2021-11-14 DIAGNOSIS — N1831 Chronic kidney disease, stage 3a: Secondary | ICD-10-CM | POA: Insufficient documentation

## 2021-11-14 DIAGNOSIS — Z7901 Long term (current) use of anticoagulants: Secondary | ICD-10-CM | POA: Insufficient documentation

## 2021-11-14 DIAGNOSIS — E1151 Type 2 diabetes mellitus with diabetic peripheral angiopathy without gangrene: Secondary | ICD-10-CM | POA: Diagnosis not present

## 2021-11-14 DIAGNOSIS — E669 Obesity, unspecified: Secondary | ICD-10-CM | POA: Diagnosis not present

## 2021-11-14 DIAGNOSIS — Z79899 Other long term (current) drug therapy: Secondary | ICD-10-CM | POA: Diagnosis not present

## 2021-11-14 DIAGNOSIS — F32A Depression, unspecified: Secondary | ICD-10-CM | POA: Diagnosis not present

## 2021-11-14 DIAGNOSIS — Z7984 Long term (current) use of oral hypoglycemic drugs: Secondary | ICD-10-CM | POA: Diagnosis not present

## 2021-11-14 DIAGNOSIS — G4733 Obstructive sleep apnea (adult) (pediatric): Secondary | ICD-10-CM | POA: Insufficient documentation

## 2021-11-14 DIAGNOSIS — I130 Hypertensive heart and chronic kidney disease with heart failure and stage 1 through stage 4 chronic kidney disease, or unspecified chronic kidney disease: Secondary | ICD-10-CM | POA: Insufficient documentation

## 2021-11-14 DIAGNOSIS — I440 Atrioventricular block, first degree: Secondary | ICD-10-CM | POA: Diagnosis not present

## 2021-11-14 LAB — BASIC METABOLIC PANEL
Anion gap: 7 (ref 5–15)
BUN: 35 mg/dL — ABNORMAL HIGH (ref 8–23)
CO2: 26 mmol/L (ref 22–32)
Calcium: 8.9 mg/dL (ref 8.9–10.3)
Chloride: 104 mmol/L (ref 98–111)
Creatinine, Ser: 1.64 mg/dL — ABNORMAL HIGH (ref 0.61–1.24)
GFR, Estimated: 44 mL/min — ABNORMAL LOW (ref >60)
Glucose, Bld: 125 mg/dL — ABNORMAL HIGH (ref 70–99)
Potassium: 4.5 mmol/L (ref 3.5–5.1)
Sodium: 137 mmol/L (ref 135–145)

## 2021-11-14 NOTE — Telephone Encounter (Signed)
Patient daughter called and stated that patient just seen the Cardiologist and they were going to send out the Paramedics to review patient's medications because they were not sure what the patient was taking. Daughter wanted to know if she was going to get charged for this.   I instructed the daughter to call the Cardiologist office to see what was going on. I told her that I was not sure what to tell her because I didn't know what was discussed at the visit. She stated that she had to drop him off and didn't go in with him either.  Instructed her to call the Cardiologist to have them review the visit with her. She agreed to call them.

## 2021-11-14 NOTE — Progress Notes (Signed)
ReDS Vest / Clip - 11/14/21 1500       ReDS Vest / Clip   Station Marker D    Ruler Value 36    ReDS Value Range Low volume    ReDS Actual Value 27

## 2021-11-14 NOTE — Patient Instructions (Signed)
Medication Changes:  None  Lab Work: Labs today We will only contact you if something comes back abnormal or we need to make some changes. Otherwise no news is good news!  Referrals   You have been referred to Paramedicine for assisting with preparing your medications and someone will reach out to you and get you scheduled.  Follow-Up in:  6 weeks with Dr. Haroldine Laws.  At the [facility], you and your health needs are our priority. We have a designated team specialized in the treatment of Heart Failure. This Care Team includes your primary Heart Failure Specialized Cardiologist (physician), Advanced Practice Providers (APPs- Physician Assistants and Nurse Practitioners), and Pharmacist who all work together to provide you with the care you need, when you need it.   You may see any of the following providers on your designated Care Team at your next follow up:  Dr Glori Bickers Dr Haynes Kerns, NP Lyda Jester, [place] [facility] [place], [place] Audry Riles, PharmD   Please be sure to bring in all your medications bottles to every appointment.   Need to Contact us:  If you have any questions or concerns before your next appointment please send [place] a message through [place] or call our office at [phone].    TO LEAVE A MESSAGE FOR THE NURSE SELECT OPTION 2, PLEASE LEAVE A MESSAGE INCLUDING: YOUR NAME DATE OF BIRTH CALL BACK NUMBER REASON FOR CALL**this is important as we prioritize the call backs  YOU WILL RECEIVE A CALL BACK THE SAME DAY AS LONG AS YOU CALL BEFORE 4:00 PM

## 2021-11-14 NOTE — Patient Outreach (Signed)
[place] [facility]) Care Management  11/14/2021  JIA MALVIN 1951-05-21 [id number]   Transition of Care Referral   Referral Date: 11/11/2021 Referral Source: [facility] Liaison Date of Discharge: 11/10/2021 Facility: [facility] PCP Office Does [facility]   Unsuccessful outreach attempt #2 to patient.     Plan: RN CM will make outreach attempt to patient within 3-4 business days.  Enzo Montgomery, RN,BSN,CCM [place] Management Telephonic Care Management Coordinator Direct Phone: [phone] Toll Free: [phone] Fax: [phone]

## 2021-11-17 ENCOUNTER — Other Ambulatory Visit: Payer: Self-pay

## 2021-11-17 ENCOUNTER — Encounter: Payer: Medicare HMO | Admitting: Family

## 2021-11-17 NOTE — Patient Outreach (Signed)
[facility]) Care Management  11/17/2021  [name] 13-Nov-1950 [id number]   Transition of Care Referral   Referral Date: 11/11/2021 Referral Source: [facility] Liaison Date of Discharge: 11/10/2021 Facility: [facility] PCP Office Does [facility]   Unsuccessful outreach attempt to patient.     Plan: RN CM will make outreach attempt to patient within 3-4 wks if no response from letter mailed to patient.  Antionette Fairy, RN,BSN,CCM [facility] Care Management Telephonic Care Management Coordinator Direct Phone: [phone] Toll Free: [phone] Fax: [phone]

## 2021-11-18 ENCOUNTER — Ambulatory Visit: Payer: Self-pay

## 2021-11-20 ENCOUNTER — Telehealth (HOSPITAL_COMMUNITY): Payer: Self-pay | Admitting: Licensed Clinical Social Worker

## 2021-11-20 ENCOUNTER — Other Ambulatory Visit (HOSPITAL_COMMUNITY): Payer: Self-pay

## 2021-11-20 MED ORDER — SPIRONOLACTONE 25 MG PO TABS: 12.5000 mg | ORAL_TABLET | Freq: Every day | ORAL | 0 refills | Status: DC

## 2021-11-20 NOTE — Progress Notes (Signed)
error 

## 2021-11-20 NOTE — Telephone Encounter (Signed)
CSW called pt to discuss consult to [employer].  CSW spoke with pt and pt daughter and they are not in agreement to paramedicine at this time.  Since clinic visit pt daughter has become more involved with managing pt medications and has no concerns about managing his needs at this time.  Pt confirms he would prefer his daughter to manage and does not want paramedicine at this time.  Dtr mentioned pt was almost out of spirolactone- CSW sent request to clinic RN to send refill to pt pharmacy.  Will continue to follow and assist as needed  Jorge Ny, [place] [facility] Desk#: [phone] Cell#: [phone]

## 2021-11-20 NOTE — Telephone Encounter (Signed)
CSW consulted to enroll pt in paramedicine program- unable to reach- left VM requesting return call  Burna Sis, LCSW Clinical Social Worker [facility] Desk#: [phone] Cell#: [phone]

## 2021-12-03 ENCOUNTER — Other Ambulatory Visit: Payer: Self-pay

## 2021-12-03 ENCOUNTER — Ambulatory Visit (INDEPENDENT_AMBULATORY_CARE_PROVIDER_SITE_OTHER): Payer: Medicare HMO

## 2021-12-03 DIAGNOSIS — Z5181 Encounter for therapeutic drug level monitoring: Secondary | ICD-10-CM

## 2021-12-03 DIAGNOSIS — I4891 Unspecified atrial fibrillation: Secondary | ICD-10-CM | POA: Diagnosis not present

## 2021-12-03 DIAGNOSIS — I483 Typical atrial flutter: Secondary | ICD-10-CM

## 2021-12-03 DIAGNOSIS — I513 Intracardiac thrombosis, not elsewhere classified: Secondary | ICD-10-CM

## 2021-12-03 LAB — POCT INR: INR: 3.5 — AB (ref 2.0–3.0)

## 2021-12-03 NOTE — Patient Instructions (Addendum)
Description   Do not take any Warfarin today then START taking Warfarin 1 tablet daily except 1/2 tablet on Tuesdays, Thursdays and Saturdays. Recheck INR in 3 weeks. Main # [phone] Cou[facility]in Clinic [phone].  Amio increased to 200mg  daily

## 2021-12-08 ENCOUNTER — Other Ambulatory Visit: Payer: Self-pay

## 2021-12-08 ENCOUNTER — Other Ambulatory Visit (HOSPITAL_COMMUNITY): Payer: Self-pay

## 2021-12-08 NOTE — Patient Outreach (Signed)
[facility]) Care Management  12/08/2021  [name] May 22, 1951 [id number]   Transition of Care Referral   Referral Date: 11/11/2021 Referral Source: [facility] Liaison Date of Discharge: 11/10/2021 Facility: [facility] PCP Office Does [facility]    Multiple attempts to establish contact with patient without success. No response from letter mailed to patient. Case is being closed at this time.      Plan: RN CM will close case.  Enzo Montgomery, RN,BSN,CCM [place] Management Telephonic Care Management Coordinator Direct Phone: [phone] Toll Free: [phone] Fax: [phone]

## 2021-12-09 ENCOUNTER — Other Ambulatory Visit: Payer: Self-pay | Admitting: Family

## 2021-12-09 ENCOUNTER — Other Ambulatory Visit: Payer: Self-pay | Admitting: *Deleted

## 2021-12-09 MED ORDER — EMPAGLIFLOZIN 10 MG PO TABS: 10.0000 mg | ORAL_TABLET | Freq: Every day | ORAL | 0 refills | Status: DC

## 2021-12-09 MED ORDER — SACUBITRIL-VALSARTAN 49-51 MG PO TABS: 1.0000 | ORAL_TABLET | Freq: Two times a day (BID) | ORAL | 0 refills | Status: DC

## 2021-12-09 MED ORDER — AMIODARONE HCL 200 MG PO TABS: 200.0000 mg | ORAL_TABLET | Freq: Every day | ORAL | 0 refills | Status: DC

## 2021-12-09 NOTE — Telephone Encounter (Signed)
[name], daughter called and stated that patient needs some refills on medications that he received in the hospital.   Patient no showed for his [facility] appointment and daughter stated that was because her car broke down. Stated that she is going to get it back today.   Confirmed patient's appointment for this Friday but daughter stated that she would not be able to bring him on a Friday but could bring him in on Thursday. Rescheduled appointment to 12/11/2021 with Dinah. Daughter is wanting Rx's to go to a local pharmacy and then she is going to discuss with Dinah on Thursday about sending them to Mail Order.   Pended Rx's and sent to [facility] for approval.

## 2021-12-10 ENCOUNTER — Other Ambulatory Visit: Payer: Self-pay | Admitting: Family

## 2021-12-10 ENCOUNTER — Telehealth (HOSPITAL_COMMUNITY): Payer: Self-pay | Admitting: Cardiology

## 2021-12-10 DIAGNOSIS — N183 Chronic diastolic (congestive) heart failure: Secondary | ICD-10-CM

## 2021-12-10 DIAGNOSIS — I130 Hypertensive heart and chronic kidney disease with heart failure and stage 1 through stage 4 chronic kidney disease, or unspecified chronic kidney disease: Secondary | ICD-10-CM

## 2021-12-10 NOTE — Telephone Encounter (Signed)
Patient has request refill on medication "Potassium Chloride". Patient medication has end date. Medication pend and sent to PCP [place], Nelda Bucks, NP .

## 2021-12-10 NOTE — Telephone Encounter (Signed)
Patients daughter called to report he will be out of entresto 12/11/21.  Reports the co pay for this medication Is now $90, in the past patient was getting samples  Advised we only provide samples on a temporary basis, we can provide a month of samples and send to pharmacy team to review for patient assistance or co pay assistance   Message to pharmacy team-

## 2021-12-11 ENCOUNTER — Other Ambulatory Visit: Payer: Self-pay

## 2021-12-11 ENCOUNTER — Emergency Department (HOSPITAL_COMMUNITY): Payer: Medicare HMO

## 2021-12-11 ENCOUNTER — Inpatient Hospital Stay (HOSPITAL_COMMUNITY)
Admission: EM | Admit: 2021-12-11 | Discharge: 2021-12-15 | DRG: 964 | Disposition: A | Discharge status: Home Health | Payer: Medicare HMO | Attending: Surgery | Admitting: Surgery

## 2021-12-11 ENCOUNTER — Inpatient Hospital Stay (HOSPITAL_COMMUNITY): Payer: Medicare HMO

## 2021-12-11 ENCOUNTER — Other Ambulatory Visit: Payer: Self-pay | Admitting: Physician Assistant

## 2021-12-11 ENCOUNTER — Encounter (HOSPITAL_COMMUNITY): Payer: Self-pay | Admitting: Radiology

## 2021-12-11 ENCOUNTER — Encounter: Payer: Medicare HMO | Admitting: Family

## 2021-12-11 DIAGNOSIS — R000 Tachycardia, unspecified: Secondary | ICD-10-CM | POA: Diagnosis not present

## 2021-12-11 DIAGNOSIS — M542 Cervicalgia: Secondary | ICD-10-CM | POA: Diagnosis not present

## 2021-12-11 DIAGNOSIS — E1122 Type 2 diabetes mellitus with diabetic chronic kidney disease: Secondary | ICD-10-CM | POA: Diagnosis present

## 2021-12-11 DIAGNOSIS — E1151 Type 2 diabetes mellitus with diabetic peripheral angiopathy without gangrene: Secondary | ICD-10-CM | POA: Diagnosis not present

## 2021-12-11 DIAGNOSIS — S270XXA Traumatic pneumothorax, initial encounter: Principal | ICD-10-CM

## 2021-12-11 DIAGNOSIS — Z833 Family history of diabetes mellitus: Secondary | ICD-10-CM | POA: Diagnosis not present

## 2021-12-11 DIAGNOSIS — J939 Pneumothorax, unspecified: Secondary | ICD-10-CM | POA: Diagnosis not present

## 2021-12-11 DIAGNOSIS — W109XXA Fall (on) (from) unspecified stairs and steps, initial encounter: Secondary | ICD-10-CM | POA: Diagnosis present

## 2021-12-11 DIAGNOSIS — Z9114 Patient's other noncompliance with medication regimen: Secondary | ICD-10-CM | POA: Diagnosis not present

## 2021-12-11 DIAGNOSIS — I5022 Chronic systolic (congestive) heart failure: Secondary | ICD-10-CM | POA: Diagnosis present

## 2021-12-11 DIAGNOSIS — Z794 Long term (current) use of insulin: Secondary | ICD-10-CM | POA: Diagnosis not present

## 2021-12-11 DIAGNOSIS — I130 Hypertensive heart and chronic kidney disease with heart failure and stage 1 through stage 4 chronic kidney disease, or unspecified chronic kidney disease: Secondary | ICD-10-CM | POA: Diagnosis present

## 2021-12-11 DIAGNOSIS — E119 Type 2 diabetes mellitus without complications: Secondary | ICD-10-CM | POA: Diagnosis not present

## 2021-12-11 DIAGNOSIS — Z8249 Family history of ischemic heart disease and other diseases of the circulatory system: Secondary | ICD-10-CM

## 2021-12-11 DIAGNOSIS — S3991XA Unspecified injury of abdomen, initial encounter: Secondary | ICD-10-CM | POA: Diagnosis not present

## 2021-12-11 DIAGNOSIS — I2510 Atherosclerotic heart disease of native coronary artery without angina pectoris: Secondary | ICD-10-CM | POA: Diagnosis present

## 2021-12-11 DIAGNOSIS — J9811 Atelectasis: Secondary | ICD-10-CM | POA: Diagnosis not present

## 2021-12-11 DIAGNOSIS — R102 Pelvic and perineal pain: Secondary | ICD-10-CM | POA: Diagnosis not present

## 2021-12-11 DIAGNOSIS — I441 Atrioventricular block, second degree: Secondary | ICD-10-CM | POA: Diagnosis present

## 2021-12-11 DIAGNOSIS — T1490XA Injury, unspecified, initial encounter: Secondary | ICD-10-CM

## 2021-12-11 DIAGNOSIS — W19XXXA Unspecified fall, initial encounter: Secondary | ICD-10-CM | POA: Diagnosis present

## 2021-12-11 DIAGNOSIS — R531 Weakness: Secondary | ICD-10-CM | POA: Diagnosis not present

## 2021-12-11 DIAGNOSIS — G4733 Obstructive sleep apnea (adult) (pediatric): Secondary | ICD-10-CM | POA: Diagnosis present

## 2021-12-11 DIAGNOSIS — S2231XA Fracture of one rib, right side, initial encounter for closed fracture: Secondary | ICD-10-CM | POA: Diagnosis not present

## 2021-12-11 DIAGNOSIS — Z20822 Contact with and (suspected) exposure to covid-19: Secondary | ICD-10-CM | POA: Diagnosis present

## 2021-12-11 DIAGNOSIS — S2241XA Multiple fractures of ribs, right side, initial encounter for closed fracture: Secondary | ICD-10-CM | POA: Diagnosis not present

## 2021-12-11 DIAGNOSIS — N1832 Chronic kidney disease, stage 3b: Secondary | ICD-10-CM | POA: Diagnosis not present

## 2021-12-11 DIAGNOSIS — J982 Interstitial emphysema: Secondary | ICD-10-CM | POA: Diagnosis not present

## 2021-12-11 DIAGNOSIS — Z7901 Long term (current) use of anticoagulants: Secondary | ICD-10-CM

## 2021-12-11 DIAGNOSIS — S3993XA Unspecified injury of pelvis, initial encounter: Secondary | ICD-10-CM | POA: Diagnosis not present

## 2021-12-11 DIAGNOSIS — R0781 Pleurodynia: Secondary | ICD-10-CM | POA: Diagnosis not present

## 2021-12-11 DIAGNOSIS — R519 Headache, unspecified: Secondary | ICD-10-CM | POA: Diagnosis not present

## 2021-12-11 DIAGNOSIS — J439 Emphysema, unspecified: Secondary | ICD-10-CM | POA: Diagnosis not present

## 2021-12-11 DIAGNOSIS — I6381 Other cerebral infarction due to occlusion or stenosis of small artery: Secondary | ICD-10-CM | POA: Diagnosis not present

## 2021-12-11 DIAGNOSIS — S72001A Fracture of unspecified part of neck of right femur, initial encounter for closed fracture: Secondary | ICD-10-CM | POA: Diagnosis not present

## 2021-12-11 DIAGNOSIS — I700 Atherosclerosis of aorta: Secondary | ICD-10-CM | POA: Diagnosis not present

## 2021-12-11 DIAGNOSIS — M1611 Unilateral primary osteoarthritis, right hip: Secondary | ICD-10-CM

## 2021-12-11 DIAGNOSIS — Z7984 Long term (current) use of oral hypoglycemic drugs: Secondary | ICD-10-CM | POA: Diagnosis not present

## 2021-12-11 DIAGNOSIS — I4891 Unspecified atrial fibrillation: Secondary | ICD-10-CM | POA: Diagnosis not present

## 2021-12-11 DIAGNOSIS — Y92018 Other place in single-family (private) house as the place of occurrence of the external cause: Secondary | ICD-10-CM | POA: Diagnosis not present

## 2021-12-11 DIAGNOSIS — R079 Chest pain, unspecified: Secondary | ICD-10-CM | POA: Diagnosis not present

## 2021-12-11 DIAGNOSIS — N400 Enlarged prostate without lower urinary tract symptoms: Secondary | ICD-10-CM | POA: Diagnosis not present

## 2021-12-11 DIAGNOSIS — F17290 Nicotine dependence, other tobacco product, uncomplicated: Secondary | ICD-10-CM | POA: Diagnosis present

## 2021-12-11 DIAGNOSIS — Z79899 Other long term (current) drug therapy: Secondary | ICD-10-CM

## 2021-12-11 DIAGNOSIS — I4892 Unspecified atrial flutter: Secondary | ICD-10-CM | POA: Diagnosis not present

## 2021-12-11 DIAGNOSIS — E785 Hyperlipidemia, unspecified: Secondary | ICD-10-CM

## 2021-12-11 DIAGNOSIS — F17210 Nicotine dependence, cigarettes, uncomplicated: Secondary | ICD-10-CM | POA: Diagnosis present

## 2021-12-11 DIAGNOSIS — T859XXA Unspecified complication of internal prosthetic device, implant and graft, initial encounter: Secondary | ICD-10-CM

## 2021-12-11 DIAGNOSIS — I480 Paroxysmal atrial fibrillation: Secondary | ICD-10-CM | POA: Diagnosis not present

## 2021-12-11 DIAGNOSIS — M549 Dorsalgia, unspecified: Secondary | ICD-10-CM | POA: Diagnosis present

## 2021-12-11 LAB — CBC
HCT: 44.6 % (ref 39.0–52.0)
Hemoglobin: 13.8 g/dL (ref 13.0–17.0)
MCH: 27.6 pg (ref 26.0–34.0)
MCHC: 30.9 g/dL (ref 30.0–36.0)
MCV: 89.2 fL (ref 80.0–100.0)
Platelets: 186 10*3/uL (ref 150–400)
RBC: 5 MIL/uL (ref 4.22–5.81)
RDW: 16.6 % — ABNORMAL HIGH (ref 11.5–15.5)
WBC: 12.3 10*3/uL — ABNORMAL HIGH (ref 4.0–10.5)
nRBC: 0 % (ref 0.0–0.2)

## 2021-12-11 LAB — URINALYSIS, ROUTINE W REFLEX MICROSCOPIC
Bilirubin Urine: NEGATIVE
Glucose, UA: >=500 mg/dL — AB
Ketones, ur: NEGATIVE mg/dL
Leukocytes,Ua: NEGATIVE
Nitrite: NEGATIVE
Protein, ur: 30 mg/dL — AB
Specific Gravity, Urine: 1.02 (ref 1.005–1.030)
pH: 5.5 (ref 5.0–8.0)

## 2021-12-11 LAB — URINALYSIS, MICROSCOPIC (REFLEX): Squamous Epithelial / HPF: NONE SEEN (ref 0–5)

## 2021-12-11 LAB — COMPREHENSIVE METABOLIC PANEL
ALT: 42 U/L (ref 0–44)
AST: 30 U/L (ref 15–41)
Albumin: 3.9 g/dL (ref 3.5–5.0)
Alkaline Phosphatase: 32 U/L — ABNORMAL LOW (ref 38–126)
Anion gap: 12 (ref 5–15)
BUN: 39 mg/dL — ABNORMAL HIGH (ref 8–23)
CO2: 22 mmol/L (ref 22–32)
Calcium: 9.1 mg/dL (ref 8.9–10.3)
Chloride: 102 mmol/L (ref 98–111)
Creatinine, Ser: 2.02 mg/dL — ABNORMAL HIGH (ref 0.61–1.24)
GFR, Estimated: 35 mL/min — ABNORMAL LOW (ref >60)
Glucose, Bld: 169 mg/dL — ABNORMAL HIGH (ref 70–99)
Potassium: 4.7 mmol/L (ref 3.5–5.1)
Sodium: 136 mmol/L (ref 135–145)
Total Bilirubin: 1 mg/dL (ref 0.3–1.2)
Total Protein: 8 g/dL (ref 6.5–8.1)

## 2021-12-11 LAB — I-STAT CHEM 8, ED
BUN: 39 mg/dL — ABNORMAL HIGH (ref 8–23)
Calcium, Ion: 1.13 mmol/L — ABNORMAL LOW (ref 1.15–1.40)
Chloride: 103 mmol/L (ref 98–111)
Creatinine, Ser: 2 mg/dL — ABNORMAL HIGH (ref 0.61–1.24)
Glucose, Bld: 165 mg/dL — ABNORMAL HIGH (ref 70–99)
HCT: 46 % (ref 39.0–52.0)
Hemoglobin: 15.6 g/dL (ref 13.0–17.0)
Potassium: 4.6 mmol/L (ref 3.5–5.1)
Sodium: 138 mmol/L (ref 135–145)
TCO2: 24 mmol/L (ref 22–32)

## 2021-12-11 LAB — PROTIME-INR
INR: 3.4 — ABNORMAL HIGH (ref 0.8–1.2)
Prothrombin Time: 34.5 seconds — ABNORMAL HIGH (ref 11.4–15.2)

## 2021-12-11 LAB — RESP PANEL BY RT-PCR (FLU A&B, COVID) ARPGX2
Influenza A by PCR: NEGATIVE
Influenza B by PCR: NEGATIVE
SARS Coronavirus 2 by RT PCR: NEGATIVE

## 2021-12-11 LAB — SAMPLE TO BLOOD BANK

## 2021-12-11 LAB — HEMOGLOBIN A1C
Hgb A1c MFr Bld: 6.8 % — ABNORMAL HIGH (ref 4.8–5.6)
Mean Plasma Glucose: 148.46 mg/dL

## 2021-12-11 LAB — LACTIC ACID, PLASMA: Lactic Acid, Venous: 1.4 mmol/L (ref 0.5–1.9)

## 2021-12-11 MED ORDER — SODIUM CHLORIDE 0.9 % IV BOLUS
500.0000 mL | Freq: Once | INTRAVENOUS | Status: AC
Start: 2021-12-11 — End: 2021-12-11
  Administered 2021-12-11: 500 mL via INTRAVENOUS

## 2021-12-11 MED ORDER — ONDANSETRON 4 MG PO TBDP: 4.0000 mg | ORAL_TABLET | Freq: Four times a day (QID) | ORAL | Status: DC | PRN

## 2021-12-11 MED ORDER — POTASSIUM CHLORIDE CRYS ER 10 MEQ PO TBCR
20.0000 meq | EXTENDED_RELEASE_TABLET | Freq: Two times a day (BID) | ORAL | Status: DC
  Administered 2021-12-11 – 2021-12-15 (×9): 20 meq via ORAL
  Filled 2021-12-11 (×16): qty 2

## 2021-12-11 MED ORDER — METHOCARBAMOL 500 MG PO TABS
500.0000 mg | ORAL_TABLET | Freq: Three times a day (TID) | ORAL | Status: DC
  Administered 2021-12-11 – 2021-12-15 (×9): 500 mg via ORAL
  Filled 2021-12-11 (×12): qty 1

## 2021-12-11 MED ORDER — POLYETHYLENE GLYCOL 3350 17 G PO PACK
17.0000 g | PACK | Freq: Every day | ORAL | Status: DC
  Administered 2021-12-12 – 2021-12-15 (×2): 17 g via ORAL
  Filled 2021-12-11 (×5): qty 1

## 2021-12-11 MED ORDER — TRAMADOL HCL 50 MG PO TABS
50.0000 mg | ORAL_TABLET | Freq: Four times a day (QID) | ORAL | Status: DC | PRN
  Administered 2021-12-14 (×2): 50 mg via ORAL
  Filled 2021-12-11 (×2): qty 1

## 2021-12-11 MED ORDER — SODIUM CHLORIDE 0.9 % IV SOLN: INTRAVENOUS | Status: DC

## 2021-12-11 MED ORDER — ONDANSETRON HCL 4 MG/2ML IJ SOLN: 4.0000 mg | Freq: Four times a day (QID) | INTRAMUSCULAR | Status: DC | PRN

## 2021-12-11 MED ORDER — AMIODARONE HCL 200 MG PO TABS
200.0000 mg | ORAL_TABLET | Freq: Every day | ORAL | Status: DC
  Administered 2021-12-11 – 2021-12-15 (×5): 200 mg via ORAL
  Filled 2021-12-11 (×5): qty 1

## 2021-12-11 MED ORDER — ACETAMINOPHEN 500 MG PO TABS
1000.0000 mg | ORAL_TABLET | Freq: Four times a day (QID) | ORAL | Status: DC
  Administered 2021-12-11 – 2021-12-15 (×12): 1000 mg via ORAL
  Filled 2021-12-11 (×15): qty 2

## 2021-12-11 MED ORDER — IOHEXOL 300 MG/ML  SOLN
80.0000 mL | Freq: Once | INTRAMUSCULAR | Status: AC | PRN
  Administered 2021-12-11: 80 mL via INTRAVENOUS

## 2021-12-11 MED ORDER — FUROSEMIDE 20 MG PO TABS
20.0000 mg | ORAL_TABLET | Freq: Two times a day (BID) | ORAL | Status: DC
  Administered 2021-12-11 – 2021-12-15 (×8): 20 mg via ORAL
  Filled 2021-12-11 (×8): qty 1

## 2021-12-11 MED ORDER — VITAMIN D (ERGOCALCIFEROL) 1.25 MG (50000 UNIT) PO CAPS
50000.0000 [IU] | ORAL_CAPSULE | ORAL | Status: DC
  Administered 2021-12-11: 50000 [IU] via ORAL
  Filled 2021-12-11: qty 1

## 2021-12-11 MED ORDER — MORPHINE SULFATE (PF) 2 MG/ML IV SOLN
1.0000 mg | INTRAVENOUS | Status: DC | PRN
  Administered 2021-12-13: 2 mg via INTRAVENOUS
  Filled 2021-12-11 (×2): qty 1

## 2021-12-11 MED ORDER — INSULIN ASPART 100 UNIT/ML IJ SOLN
0.0000 [IU] | Freq: Three times a day (TID) | INTRAMUSCULAR | Status: DC
  Administered 2021-12-11 – 2021-12-12 (×2): 3 [IU] via SUBCUTANEOUS
  Administered 2021-12-12: 2 [IU] via SUBCUTANEOUS

## 2021-12-11 MED ORDER — SACUBITRIL-VALSARTAN 49-51 MG PO TABS
1.0000 | ORAL_TABLET | Freq: Two times a day (BID) | ORAL | Status: DC
  Administered 2021-12-11 – 2021-12-15 (×9): 1 via ORAL
  Filled 2021-12-11 (×10): qty 1

## 2021-12-11 MED ORDER — SPIRONOLACTONE 12.5 MG HALF TABLET
12.5000 mg | ORAL_TABLET | Freq: Every day | ORAL | Status: DC
  Administered 2021-12-11 – 2021-12-15 (×5): 12.5 mg via ORAL
  Filled 2021-12-11 (×5): qty 1

## 2021-12-11 MED ORDER — MORPHINE SULFATE (PF) 4 MG/ML IV SOLN
4.0000 mg | Freq: Once | INTRAVENOUS | Status: AC
  Administered 2021-12-11: 4 mg via INTRAVENOUS
  Filled 2021-12-11: qty 1
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
## 2021-12-11 NOTE — Consult Note (Signed)
Reason for Consult:Right hip fx Referring Physician: Georganna Skeans Time called: [id number] Time at bedside: [name] is an 71 y.o. male.  HPI: [name] fell off of his porch Tuesday. He has been trying to tough it out but finally came to the ED for evaluation. Workup showed a right fem neck fx in addition to other injuries and orthopedic surgery was consulted. Upon further review of the CT scan it appears that he has advanced OA but no acute fx of the hip. He tells me he has chronic pain in the hip ever since a BCC about 4y ago and walks with a limp.  Past Medical History:  Diagnosis Date   A-fib [facility])    a. (06/04/14) TEE-DC-CV; succesful; large LA 6.2 cm   ADHD    Ascites    Athlete's foot    Chronic systolic heart failure ([place])    a. 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, unspecified CKD stage, unspecified whether long term insulin use (HCC)    Heart murmur    HTN (hypertension)    OSA (obstructive sleep apnea)    PVD (peripheral vascular disease) (HCC)    s/p great toe amputation    Past Surgical History:  Procedure Laterality Date   ABDOMINAL AORTOGRAM W/LOWER EXTREMITY N/A 05/19/2021   Procedure: ABDOMINAL AORTOGRAM W/LOWER EXTREMITY;  Surgeon: Marty Heck, MD;  Location: [facility];  Service: Cardiovascular;  Laterality: N/A;   AMPUTATION Left 05/22/2021   Procedure: LEFT FIFTH TOE AMPUTATION;  Surgeon: Marty Heck, MD;  Location: [place] [facility];  Service: Vascular;  Laterality: Left;   CARDIOVERSION N/A 06/04/2014   Procedure: CARDIOVERSION;  Surgeon: Larey Dresser, MD;  Location: [facility] ENDOSCOPY;  Service: Cardiovascular;  Laterality: N/A;   CARDIOVERSION N/A 11/06/2021   Procedure: CARDIOVERSION;  Surgeon: Larey Dresser, MD;  Location: [facility]  ENDOSCOPY;  Service: Cardiovascular;  Laterality: N/A;   NOSE SURGERY     Nasal septum surgery   PERIPHERAL VASCULAR INTERVENTION Left 05/19/2021   Procedure: PERIPHERAL VASCULAR INTERVENTION;  Surgeon: Marty Heck, MD;  Location: [place] [facility];  Service: Cardiovascular;  Laterality: Left;   TEE WITHOUT CARDIOVERSION N/A 06/04/2014   Procedure: TRANSESOPHAGEAL ECHOCARDIOGRAM (TEE);  Surgeon: Larey Dresser, MD;  Location: [place];  Service: Cardiovascular;  Laterality: N/A;   TEE WITHOUT CARDIOVERSION N/A 01/06/2016   Procedure: TRANSESOPHAGEAL ECHOCARDIOGRAM (TEE);  Surgeon: Fay Records, MD;  Location: [place];  Service: Cardiovascular;  Laterality: N/A;   TEE WITHOUT CARDIOVERSION N/A 11/06/2021   Procedure: TRANSESOPHAGEAL ECHOCARDIOGRAM (TEE);  Surgeon: Larey Dresser, MD;  Location: [facility] ENDOSCOPY;  Service: Cardiovascular;  Laterality: N/A;   VASECTOMY      Family History  Problem Relation Age of Onset   Heart attack Mother        deceased   Diabetes Mother    Heart attack Sister     Social History:  reports that he has been smoking cigarettes and cigars. He has never used smokeless tobacco. He reports that he does not currently use alcohol. He reports that he does not use drugs.  Allergies: No Known Allergies  Medications: I have reviewed the patient's current medications.  Results for orders placed or performed during the hospital encounter of 12/11/21 (from  the past 48 hour(s))  Urinalysis, Routine w reflex microscopic Urine, Clean Catch     Status: Abnormal   Collection Time: 12/11/21  8:17 AM  Result Value Ref Range   Color, Urine YELLOW YELLOW   APPearance CLEAR CLEAR   Specific Gravity, Urine 1.020 1.005 - 1.030   pH 5.5 5.0 - 8.0   Glucose, UA >=500 (A) NEGATIVE mg/dL   Hgb urine dipstick MODERATE (A) NEGATIVE   Bilirubin Urine NEGATIVE NEGATIVE   Ketones, ur NEGATIVE NEGATIVE mg/dL   Protein, ur 30 (A) NEGATIVE mg/dL   Nitrite NEGATIVE  NEGATIVE   Leukocytes,Ua NEGATIVE NEGATIVE    Comment: Performed at [facility] [address], [place], [place] [address]  Urinalysis, Microscopic (reflex)     Status: Abnormal   Collection Time: 12/11/21  8:17 AM  Result Value Ref Range   RBC / HPF 11-20 0 - 5 RBC/hpf   WBC, UA 0-5 0 - 5 WBC/hpf   Bacteria, UA RARE (A) NONE SEEN   Squamous Epithelial / LPF NONE SEEN 0 - 5   Mucus PRESENT     Comment: Performed at [facility], [place] [address] [place], [place] [address]  Comprehensive metabolic panel     Status: Abnormal   Collection Time: 12/11/21  8:51 AM  Result Value Ref Range   Sodium 136 135 - 145 mmol/L   Potassium 4.7 3.5 - 5.1 mmol/L   Chloride 102 98 - 111 mmol/L   CO2 22 22 - 32 mmol/L   Glucose, Bld 169 (H) 70 - 99 mg/dL    Comment: Glucose reference range applies only to samples taken after fasting for at least 8 hours.   BUN 39 (H) 8 - 23 mg/dL   Creatinine, Ser 2.02 (H) 0.61 - 1.24 mg/dL   Calcium 9.1 8.9 - 10.3 mg/dL   Total Protein 8.0 6.5 - 8.1 g/dL   Albumin 3.9 3.5 - 5.0 g/dL   AST 30 15 - 41 U/L   ALT 42 0 - 44 U/L   Alkaline Phosphatase 32 (L) 38 - 126 U/L   Total Bilirubin 1.0 0.3 - 1.2 mg/dL   GFR, Estimated 35 (L) >60 mL/min    Comment: (NOTE) Calculated using the CKD-EPI Creatinine Equation (2021)    Anion gap 12 5 - 15    Comment: Performed at [facility] [address], [place], [place] [address]  CBC     Status: Abnormal   Collection Time: 12/11/21  8:51 AM  Result Value Ref Range   WBC 12.3 (H) 4.0 - 10.5 K/uL   RBC 5.00 4.22 - 5.81 MIL/uL   Hemoglobin 13.8 13.0 - 17.0 g/dL   HCT 44.6 39.0 - 52.0 %   MCV 89.2 80.0 - 100.0 fL   MCH 27.6 26.0 - 34.0 pg   MCHC 30.9 30.0 - 36.0 g/dL   RDW 16.6 (H) 11.5 - 15.5 %   Platelets 186 150 - 400 K/uL   nRBC 0.0 0.0 - 0.2 %    Comment: Performed at [facility], [name] [address], [place], [place] [address]  Lactic acid, plasma     Status: None   Collection Time: 12/11/21   8:51 AM  Result Value Ref Range   Lactic Acid, Venous 1.4 0.5 - 1.9 mmol/L    Comment: Performed at [facility] [address], [place], [place] [address]  Protime-INR     Status: Abnormal   Collection Time: 12/11/21  8:51 AM  Result Value Ref Range   Prothrombin Time 34.5 (H) 11.4 - 15.2 seconds   INR 3.4 (H) 0.8 - 1.2    Comment: (NOTE) INR goal varies based on device and disease states. Performed at [facility], [place] [address], [place], [place] [address]   Sample to Blood Bank     Status: None   Collection Time: 12/11/21  8:51 AM  Result Value Ref Range   Blood Bank Specimen SAMPLE AVAILABLE FOR TESTING    Sample Expiration      12/12/2021,2359 Performed at [facility], [place] [address], [place], [place] [address]   Resp Panel by RT-PCR (Flu A&B, Covid) Nasopharyngeal Swab     Status: None   Collection Time: 12/11/21  8:57 AM   Specimen: Nasopharyngeal Swab; Nasopharyngeal(NP) swabs in vial transport medium  Result Value Ref Range   SARS Coronavirus 2 by RT PCR NEGATIVE NEGATIVE    Comment: (NOTE) SARS-CoV-2 target nucleic acids are NOT DETECTED.  The SARS-CoV-2 RNA is generally detectable in upper respiratory specimens during the acute phase of infection. The lowest concentration of SARS-CoV-2 viral copies this assay can detect is 138 copies/mL. A negative result does not preclude SARS-Cov-2 infection and should not be used as the sole basis for treatment or other patient management decisions. A negative result may occur with  improper specimen collection/handling, submission of specimen other than nasopharyngeal swab, presence of viral mutation(s) within the areas targeted by this assay, and inadequate number of viral copies(<138 copies/mL). A negative result must be combined with clinical observations, patient history, and epidemiological information. The expected result is Negative.  Fact Sheet for Patients:   EntrepreneurPulse.com.au  Fact Sheet for Healthcare Providers:  IncredibleEmployment.be  This test is no t yet approved or cleared by the [place] FDA and  has been authorized for detection and/or diagnosis of SARS-CoV-2 by FDA under an Emergency Use Authorization (EUA). This EUA will remain  in effect (meaning this test can be used) for the duration of the COVID-19 declaration under Section 564(b)(1) of the Act, 21 U.S.C.section 360bbb-3(b)(1), unless the authorization is terminated  or revoked sooner.       Influenza A by PCR NEGATIVE NEGATIVE   Influenza B by PCR NEGATIVE NEGATIVE    Comment: (NOTE) The Xpert Xpress SARS-CoV-2/FLU/RSV plus assay is intended as an aid in the diagnosis of influenza from Nasopharyngeal swab specimens and should not be used as a sole basis for treatment. Nasal washings and aspirates are unacceptable for Xpert Xpress SARS-CoV-2/FLU/RSV testing.  Fact Sheet for Patients: EntrepreneurPulse.com.au  Fact Sheet for Healthcare Providers: IncredibleEmployment.be  This test is not yet approved or cleared by the [place] FDA and has been authorized for detection and/or diagnosis of SARS-CoV-2 by FDA under an Emergency Use Authorization (EUA). This EUA will remain in effect (meaning this test can be used) for the duration of the COVID-19 declaration under Section 564(b)(1) of the Act, 21 U.S.C. section 360bbb-3(b)(1), unless the authorization is terminated or revoked.  Performed at [facility], [place] [address] [place], [place] [address]   I-Stat Chem 8, ED     Status: Abnormal   Collection Time: 12/11/21  9:01 AM  Result Value Ref Range   Sodium 138 135 - 145 mmol/L   Potassium 4.6 3.5 - 5.1 mmol/L   Chloride 103 98 - 111 mmol/L   BUN 39 (H) 8 - 23 mg/dL   Creatinine, Ser 2.00 (H) 0.61 - 1.24 mg/dL  Glucose, Bld 165 (H) 70 - 99 mg/dL    Comment: Glucose  reference range applies only to samples taken after fasting for at least 8 hours.   Calcium, Ion 1.13 (L) 1.15 - 1.40 mmol/L   TCO2 24 22 - 32 mmol/L   Hemoglobin 15.6 13.0 - 17.0 g/dL   HCT 46.0 39.0 - 52.0 %    DG Pelvis 1-2 Views  Result Date: 12/11/2021 CLINICAL DATA:  Trauma right back pain after fall 2 days ago. EXAM: PELVIS - 1-2 VIEW COMPARISON:  None. FINDINGS: Irregularity of the femoral neck with superior displacement of the femur concerning for subcapital femoral neck fracture. Diffuse osteopenia. Advanced right hip osteoarthritis. Multilevel degenerative disc disease of the lumbar spine. IMPRESSION: Right subcapital femoral neck fracture. Electronically Signed   By: Keane Police D.O.   On: 12/11/2021 10:17   CT HEAD WO CONTRAST (5MM)  Result Date: 12/11/2021 CLINICAL DATA:  Patient fell 2 days ago.  Pain all over. EXAM: CT HEAD WITHOUT CONTRAST CT CERVICAL SPINE WITHOUT CONTRAST TECHNIQUE: Multidetector CT imaging of the head and cervical spine was performed following the standard protocol without intravenous contrast. Multiplanar CT image reconstructions of the cervical spine were also generated. RADIATION DOSE REDUCTION: This exam was performed according to the departmental dose-optimization program which includes automated exposure control, adjustment of the mA and/or kV according to patient size and/or use of iterative reconstruction technique. COMPARISON:  No comparison studies available. FINDINGS: CT HEAD FINDINGS Brain: There is no evidence for acute hemorrhage, hydrocephalus, mass lesion, or abnormal extra-axial fluid collection. No definite CT evidence for acute infarction. Tiny lacunar infarct noted right cerebellum Vascular: No hyperdense vessel or unexpected calcification. Skull: No evidence for fracture. No worrisome lytic or sclerotic lesion. Sinuses/Orbits: The visualized paranasal sinuses and mastoid air cells are clear. Visualized portions of the globes and intraorbital fat  are unremarkable. Other: None. CT CERVICAL SPINE FINDINGS Alignment: Normal. Skull base and vertebrae: No acute fracture. No primary bone lesion or focal pathologic process. Soft tissues and spinal canal: Soft tissue gas is noted in the right lower neck and supraclavicular region. Disc levels: Marked loss of disc height noted C6-7 into a lesser degree at C7-T1. Endplate spurring noted at both levels. There is diffuse bilateral facet osteoarthritis with fusion of the C2-3 facets on the left. Upper chest: Tiny right apical pneumothorax evident. Other: None. IMPRESSION: 1. No acute intracranial abnormality. 2. No evidence for cervical spine fracture or subluxation. 3. Soft tissue gas in the right lower neck and supraclavicular region is associated with a tiny anterior pneumothorax identified in the visualized portion of the right lung apex. Patient had a CT chest performed at the same time as this exam. Please see that report for full details. Electronically Signed   By: Misty Stanley M.D.   On: 12/11/2021 10:34   CT Cervical Spine Wo Contrast  Result Date: 12/11/2021 CLINICAL DATA:  Patient fell 2 days ago.  Pain all over. EXAM: CT HEAD WITHOUT CONTRAST CT CERVICAL SPINE WITHOUT CONTRAST TECHNIQUE: Multidetector CT imaging of the head and cervical spine was performed following the standard protocol without intravenous contrast. Multiplanar CT image reconstructions of the cervical spine were also generated. RADIATION DOSE REDUCTION: This exam was performed according to the departmental dose-optimization program which includes automated exposure control, adjustment of the mA and/or kV according to patient size and/or use of iterative reconstruction technique. COMPARISON:  No comparison studies available. FINDINGS: CT HEAD FINDINGS Brain: There is no evidence for acute hemorrhage,  hydrocephalus, mass lesion, or abnormal extra-axial fluid collection. No definite CT evidence for acute infarction. Tiny lacunar infarct  noted right cerebellum Vascular: No hyperdense vessel or unexpected calcification. Skull: No evidence for fracture. No worrisome lytic or sclerotic lesion. Sinuses/Orbits: The visualized paranasal sinuses and mastoid air cells are clear. Visualized portions of the globes and intraorbital fat are unremarkable. Other: None. CT CERVICAL SPINE FINDINGS Alignment: Normal. Skull base and vertebrae: No acute fracture. No primary bone lesion or focal pathologic process. Soft tissues and spinal canal: Soft tissue gas is noted in the right lower neck and supraclavicular region. Disc levels: Marked loss of disc height noted C6-7 into a lesser degree at C7-T1. Endplate spurring noted at both levels. There is diffuse bilateral facet osteoarthritis with fusion of the C2-3 facets on the left. Upper chest: Tiny right apical pneumothorax evident. Other: None. IMPRESSION: 1. No acute intracranial abnormality. 2. No evidence for cervical spine fracture or subluxation. 3. Soft tissue gas in the right lower neck and supraclavicular region is associated with a tiny anterior pneumothorax identified in the visualized portion of the right lung apex. Patient had a CT chest performed at the same time as this exam. Please see that report for full details. Electronically Signed   By: Misty Stanley M.D.   On: 12/11/2021 10:34   CT CHEST ABDOMEN PELVIS W CONTRAST  Result Date: 12/11/2021 CLINICAL DATA:  Chest and abdominal pain after fall 2 days ago EXAM: CT CHEST, ABDOMEN, AND PELVIS WITH CONTRAST TECHNIQUE: Multidetector CT imaging of the chest, abdomen and pelvis was performed following the standard protocol during bolus administration of intravenous contrast. RADIATION DOSE REDUCTION: This exam was performed according to the departmental dose-optimization program which includes automated exposure control, adjustment of the mA and/or kV according to patient size and/or use of iterative reconstruction technique. CONTRAST:  42mL OMNIPAQUE  IOHEXOL 300 MG/ML  SOLN COMPARISON:  Radiograph of same day.  CT of May 09, 2014. FINDINGS: CT CHEST FINDINGS Cardiovascular: Atherosclerosis of thoracic aorta is noted without aneurysm or dissection. Normal cardiac size. No pericardial effusion. Coronary artery calcifications are noted. Mediastinum/Nodes: No enlarged mediastinal, hilar, or axillary lymph nodes. Thyroid gland, trachea, and esophagus demonstrate no significant findings. Lungs/Pleura: Small right anterior pneumothorax is noted on the order of 15-20%. No pleural effusion is noted. Bilateral lower lobe atelectasis is noted. Musculoskeletal: Mildly displaced right fifth, sixth and seventh rib fractures are noted. Extensive subcutaneous emphysema is seen in the right lateral chest wall. CT ABDOMEN PELVIS FINDINGS Hepatobiliary: No focal liver abnormality is seen. No gallstones, gallbladder wall thickening, or biliary dilatation. Pancreas: Unremarkable. No pancreatic ductal dilatation or surrounding inflammatory changes. Spleen: Normal in size without focal abnormality. Adrenals/Urinary Tract: Adrenal glands are unremarkable. Kidneys are normal, without renal calculi, focal lesion, or hydronephrosis. Bladder is unremarkable. Stomach/Bowel: Stomach is within normal limits. Appendix appears normal. No evidence of bowel wall thickening, distention, or inflammatory changes. Vascular/Lymphatic: Aortic atherosclerosis. No enlarged abdominal or pelvic lymph nodes. Reproductive: Mild prostatic enlargement is noted. Other: No abdominal wall hernia or abnormality. No abdominopelvic ascites. Musculoskeletal: Severe degenerative changes seen involving the right hip joint. No acute osseous abnormality is noted. IMPRESSION: Small right anterior and basilar pneumothorax is noted on the order of 15-20%. This pneumothorax was described on radiograph of same day. Mildly displaced fractures are seen involving the lateral portions of the right fifth, sixth and seventh ribs.  Extensive subcutaneous emphysema is seen involving the right chest wall. Bibasilar atelectasis is noted, right greater than left.  No traumatic abnormality seen in the abdomen or pelvis. Mild prostatic enlargement is noted. Severe degenerative joint disease of right hip. Aortic Atherosclerosis (ICD10-I70.0). Electronically Signed   By: Marijo Conception M.D.   On: 12/11/2021 10:44   DG Chest Portable 1 View  Result Date: 12/11/2021 CLINICAL DATA:  Chest tube insertion. EXAM: PORTABLE CHEST 1 VIEW COMPARISON:  CT chest 12/11/2021, AP chest 12/11/2021 chest two views 10/28/2021 FINDINGS: New small pigtail catheter with tip overlying the medial right hemithorax. Interval decrease and near resolution of the prior tiny right apical pneumothorax. Medial right lower lung greater than left lower lung linear subsegmental atelectasis. No definite pleural effusion. Cardiac silhouette again appears mildly enlarged. Mediastinal contours are grossly within normal limits. Lateral right sixth rib mildly displaced fracture is again noted. The additional right fifth and seventh rib fractures seen on prior CT are less well visualized. Right lateral chest wall subcutaneous air is again seen. IMPRESSION:: IMPRESSION: 1. Interval placement of right-sided chest tube. Interval decrease and near resolution of the prior tiny right apical pneumothorax. 2. Right lateral acute rib fractures are again noted on radiographs and prior CT. Electronically Signed   By: Yvonne Kendall M.D.   On: 12/11/2021 13:12   DG Chest Port 1 View  Result Date: 12/11/2021 CLINICAL DATA:  Status post fall.  Right lower anterior rib pain. EXAM: PORTABLE CHEST 1 VIEW COMPARISON:  10/28/2021 FINDINGS: Right infrahilar airspace disease likely reflecting atelectasis versus scarring. Left basilar atelectasis. Small right apical pneumothorax measuring less than 10%. No left pneumothorax. No pleural effusion. Stable cardiomediastinal silhouette. Soft tissue emphysema  along the right lateral chest wall a and right side of the neck. Right lateral sixth rib fracture partially visualized. IMPRESSION: 1. Small right apical pneumothorax measuring less than 10%. 2. Right lateral rib fracture partially visualized. Critical Value/emergent results were called by telephone at the time of interpretation on 12/11/2021 at 8:54 am to provider [facility] , who verbally acknowledged these results. Electronically Signed   By: Kathreen Devoid M.D.   On: 12/11/2021 08:55   DG FEMUR, MIN 2 VIEWS RIGHT  Result Date: 12/11/2021 CLINICAL DATA:  Right leg immobility after fall 2 days ago. EXAM: RIGHT FEMUR 2 VIEWS COMPARISON:  Right hip x-rays dated May 23, 2021. Right knee x-rays dated May 21, 2021. FINDINGS: No acute fracture or dislocation. Unchanged severe right hip and mild-to-moderate right knee osteoarthritis. IMPRESSION: 1. No acute osseous abnormality. 2. Unchanged severe right hip osteoarthritis. Electronically Signed   By: Titus Dubin M.D.   On: 12/11/2021 10:11    Review of Systems  HENT:  Negative for ear discharge, ear pain, hearing loss and tinnitus.   Eyes:  Negative for photophobia and pain.  Respiratory:  Negative for cough and shortness of breath.   Cardiovascular:  Positive for chest pain.  Gastrointestinal:  Negative for abdominal pain, nausea and vomiting.  Genitourinary:  Negative for dysuria, flank pain, frequency and urgency.  Musculoskeletal:  Positive for arthralgias (Right hip). Negative for back pain, myalgias and neck pain.  Neurological:  Negative for dizziness and headaches.  Hematological:  Does not bruise/bleed easily.  Psychiatric/Behavioral:  The patient is not nervous/anxious.   Blood pressure (!) 134/102, pulse (!) 50, temperature 97.6 F (36.4 C), temperature source Oral, resp. rate 20, SpO2 97 %. Physical Exam Constitutional:      General: He is not in acute distress.    Appearance: He is well-developed. He is not diaphoretic.  HENT:  Head: Normocephalic and atraumatic.  Eyes:     General: No scleral icterus.       Right eye: No discharge.        Left eye: No discharge.     Conjunctiva/sclera: Conjunctivae normal.  Cardiovascular:     Rate and Rhythm: Normal rate and regular rhythm.  Pulmonary:     Effort: Pulmonary effort is normal. No respiratory distress.  Musculoskeletal:     Cervical back: Normal range of motion.     Comments: RLE No traumatic wounds, ecchymosis, or rash  Pain with int/ext rotation hip  No knee or ankle effusion  Knee stable to varus/ valgus and anterior/posterior stress  Sens DPN, SPN, TN intact  Motor EHL, ext, flex, evers 5/5  DP 0, PT 0, No significant edema  Skin:    General: Skin is warm and dry.  Neurological:     Mental Status: He is alert.  Psychiatric:        Mood and Affect: Mood normal.        Behavior: Behavior normal.    Assessment/Plan: Right hip OA -- May be WBAT RLE. Pt would benefit from THA in the outpatient setting. Recommend f/u with Dr. Ninfa Linden once discharged.    Darren Abu, PA-C Orthopedic Surgery [phone] 12/11/2021, 1:54 PM

## 2021-12-11 NOTE — ED Provider Notes (Addendum)
[facility]ER[facility]ENCY DEPARTMENT Provider Note   CSN: [id number] Arrival date & time: 12/11/21  2440     History  Chief Complaint  Patient presents with   Back Pain    [name] is a 71 y.o. male history of atrial fibrillation on Coumadin, CKD, diabetes, hypertension, OSA, PVD.  Patient presented to the ER for evaluation of right hip and right side pain after fall off his porch that happened Tuesday afternoon, 2 days ago.  Patient fell directly onto the right side, he had immediate pain, sharp aching moderate intensity worsened with movement and palpation, no alleviating factors.  Patient was able to ambulate after the fall, he has been trying to find a comfortable position at home and was hoping the pain would improve which is why he delayed presentation.  Today patient continued to experience worsening pain which is why he came to the ER.  Denies head injury, loss consciousness, vision changes, neck pain, upper back pain, cough/hemoptysis, abdominal pain, hematuria, numbness, tingling, weakness or any additional concerns.  HPI     Home Medications Prior to Admission medications   Medication Sig Start Date End Date Taking? Authorizing Provider  amiodarone (PACERONE) 200 MG tablet Take 1 tablet (200 mg total) by mouth daily. 12/09/21 01/08/22 Yes Ngetich, Dinah C, NP  Cyanocobalamin (VITAMIN B-12 PO) Take 1 capsule by mouth daily.   Yes [provider]  diclofenac Sodium (VOLTAREN) 1 % GEL Apply 2 g topically 4 (four) times daily. Patient taking differently: Apply 2 g topically 4 (four) times daily as needed (arthritis pain). 06/26/21  Yes Ngetich, Dinah C, NP  empagliflozin (JARDIANCE) 10 MG TABS tablet Take 1 tablet (10 mg total) by mouth daily. 12/09/21 01/08/22 Yes Ngetich, Dinah C, NP  furosemide (LASIX) 20 MG tablet Take 1 tablet (20 mg total) by mouth 2 (two) times daily. May take an additional 56m daily as needed. 10/21/21  Yes Milford, JStanford FNP   Green Tea, Camellia sinensis, (GREEN TEA EXTRACT PO) Take 1 Dose by mouth daily.   Yes [provider]  insulin glargine (LANTUS) 100 UNIT/ML injection Inject 0.08 mLs (8 Units total) into the skin daily. 06/26/21 11/14/22 Yes Ngetich, Dinah C, NP  KRILL OIL OMEGA-3 PO Take 1 tablet by mouth daily.   Yes [provider]  polyethylene glycol powder (GLYCOLAX/MIRALAX) 17 GM/SCOOP powder Take 17 g by mouth daily. Hold for loose stool Patient taking differently: Take 17 g by mouth daily as needed for mild constipation. Hold for loose stool 06/26/21  Yes Ngetich, Dinah C, NP  potassium chloride (KLOR-CON) 10 MEQ tablet TAKE 2 TABLETS(20 MEQ) BY MOUTH TWICE DAILY Patient taking differently: 20 mEq 2 (two) times daily. 12/10/21  Yes Ngetich, Dinah C, NP  sacubitril-valsartan (ENTRESTO) 49-51 MG Take 1 tablet by mouth 2 (two) times daily. 12/09/21 01/08/22 Yes Ngetich, Dinah C, NP  Sodium Hyaluronate, oral, (HYALURONIC ACID PO) Take 1 tablet by mouth daily. Vit c, 120 mg ,magnesium,171m   Yes [provider]  spironolactone (ALDACTONE) 25 MG tablet Take 0.5 tablet (12.5 mg total) by mouth daily. 11/20/21 12/20/21 Yes Clegg, Amy D, NP  Vitamin D, Ergocalciferol, (DRISDOL) 1.25 MG (50000 UNIT) CAPS capsule TAKE 1 CAPSULE BY MOUTH EVERY THURSDAY 09/09/21  Yes Ngetich, Dinah C, NP  warfarin (COUMADIN) 6 MG tablet Take 1 tablet (6 mg total) by mouth daily. Take 2-1 tablets daily. Patient taking differently: Take 3-6 mg by mouth See admin instructions. Take 29m22mn Tuesdays,  Thursday and Saturdays, then 68m on  all other days. 07/24/21  Yes Camnitz, Will MHassell Done MD  atorvastatin (LIPITOR) 10 MG tablet TAKE 1 TABLET(10 MG) BY MOUTH DAILY Patient not taking: Reported on 12/11/2021 12/11/21   CWaynetta Sandy MD  blood glucose meter kit and supplies Dispense based on patient and insurance preference. Use up to four times daily as directed. (FOR ICD-10 E10.9, E11.9). 10/01/21   GGerlene Fee  NP      Allergies    Patient has no known allergies.    Review of Systems   Review of Systems Ten systems are reviewed and are negative for acute change except as noted in the HPI  Physical Exam Updated Vital Signs BP (!) 134/102    Pulse (!) 50    Temp 97.6 F (36.4 C) (Oral)    Resp 20    SpO2 97%  Physical Exam Constitutional:      General: He is not in acute distress.    Appearance: Normal appearance. He is well-developed. He is not ill-appearing or diaphoretic.  HENT:     Head: Normocephalic and atraumatic.  Eyes:     General: Vision grossly intact. Gaze aligned appropriately.     Pupils: Pupils are equal, round, and reactive to light.  Neck:     Trachea: Trachea and phonation normal.  Cardiovascular:     Rate and Rhythm: Normal rate and regular rhythm.     Pulses:          Radial pulses are 2+ on the right side and 2+ on the left side.       Dorsalis pedis pulses are 2+ on the right side and 2+ on the left side.  Pulmonary:     Effort: Pulmonary effort is normal. No respiratory distress.     Breath sounds: Normal breath sounds and air entry.  Chest:     Chest wall: Tenderness present. No deformity.    Abdominal:     General: There is no distension.     Palpations: Abdomen is soft.     Tenderness: There is no abdominal tenderness. There is no guarding or rebound.  Musculoskeletal:        General: Normal range of motion.     Cervical back: Normal range of motion. No spinous process tenderness or muscular tenderness.     Comments: No midline spinal tenderness palpation.  No crepitus step-off or deformity of the spine.  Right lumbar paraspinal muscular tenderness palpation.  Pelvis is stable to compression without pain.  Tenderness over the right hip.  No shortening or deformity noted.  Decreased ROM of the right hip due to pain.  Appropriate range of motion with all other major joints of the bilateral upper and lower extremities without increased pain.  Prior left thumb  amputation  Feet:     Right foot:     Protective Sensation: 3 sites tested.  3 sites sensed.     Left foot:     Protective Sensation: 3 sites tested.  3 sites sensed.  Skin:    General: Skin is warm and dry.  Neurological:     Mental Status: He is alert.     GCS: GCS eye subscore is 4. GCS verbal subscore is 5. GCS motor subscore is 6.     Comments: Speech is clear and goal oriented, follows commands Major Cranial nerves without deficit, no facial droop Moves extremities without ataxia, coordination intact  Psychiatric:        Behavior:  Behavior normal.    ED Results / Procedures / Treatments   Labs (all labs ordered are listed, but only abnormal results are displayed) Labs Reviewed  COMPREHENSIVE METABOLIC PANEL - Abnormal; Notable for the following components:      Result Value   Glucose, Bld 169 (*)    BUN 39 (*)    Creatinine, Ser 2.02 (*)    Alkaline Phosphatase 32 (*)    GFR, Estimated 35 (*)    All other components within normal limits  CBC - Abnormal; Notable for the following components:   WBC 12.3 (*)    RDW 16.6 (*)    All other components within normal limits  URINALYSIS, ROUTINE W REFLEX MICROSCOPIC - Abnormal; Notable for the following components:   Glucose, UA >=500 (*)    Hgb urine dipstick MODERATE (*)    Protein, ur 30 (*)    All other components within normal limits  PROTIME-INR - Abnormal; Notable for the following components:   Prothrombin Time 34.5 (*)    INR 3.4 (*)    All other components within normal limits  URINALYSIS, MICROSCOPIC (REFLEX) - Abnormal; Notable for the following components:   Bacteria, UA RARE (*)    All other components within normal limits  I-STAT CHEM 8, ED - Abnormal; Notable for the following components:   BUN 39 (*)    Creatinine, Ser 2.00 (*)    Glucose, Bld 165 (*)    Calcium, Ion 1.13 (*)    All other components within normal limits  RESP PANEL BY RT-PCR (FLU A&B, COVID) ARPGX2  LACTIC ACID, PLASMA  ETHANOL   HEMOGLOBIN A1C  SAMPLE TO BLOOD BANK    EKG EKG Interpretation  Date/Time:  Thursday December 11 2021 11:02:35 EST Ventricular Rate:  95 PR Interval:    QRS Duration: 106 QT Interval:  389 QTC Calculation: 489 R Axis:   74 Text Interpretation: Sinus tachycardia vs intermittent Afib Second deg AVB, Mobitz I (Wenckebach) Borderline low voltage, extremity leads Nonspecific T abnormalities, lateral leads Borderline prolonged QT interval Confirmed by Aletta Edouard [phone]) on 12/11/2021 11:06:02 AM  Radiology DG Pelvis 1-2 Views  Result Date: 12/11/2021 CLINICAL DATA:  Trauma right back pain after fall 2 days ago. EXAM: PELVIS - 1-2 VIEW COMPARISON:  None. FINDINGS: Irregularity of the femoral neck with superior displacement of the femur concerning for subcapital femoral neck fracture. Diffuse osteopenia. Advanced right hip osteoarthritis. Multilevel degenerative disc disease of the lumbar spine. IMPRESSION: Right subcapital femoral neck fracture. Electronically Signed   By: Keane Police D.O.   On: 12/11/2021 10:17   CT HEAD WO CONTRAST (5MM)  Result Date: 12/11/2021 CLINICAL DATA:  Patient fell 2 days ago.  Pain all over. EXAM: CT HEAD WITHOUT CONTRAST CT CERVICAL SPINE WITHOUT CONTRAST TECHNIQUE: Multidetector CT imaging of the head and cervical spine was performed following the standard protocol without intravenous contrast. Multiplanar CT image reconstructions of the cervical spine were also generated. RADIATION DOSE REDUCTION: This exam was performed according to the departmental dose-optimization program which includes automated exposure control, adjustment of the mA and/or kV according to patient size and/or use of iterative reconstruction technique. COMPARISON:  No comparison studies available. FINDINGS: CT HEAD FINDINGS Brain: There is no evidence for acute hemorrhage, hydrocephalus, mass lesion, or abnormal extra-axial fluid collection. No definite CT evidence for acute infarction. Tiny  lacunar infarct noted right cerebellum Vascular: No hyperdense vessel or unexpected calcification. Skull: No evidence for fracture. No worrisome lytic or sclerotic lesion. Sinuses/Orbits: The  visualized paranasal sinuses and mastoid air cells are clear. Visualized portions of the globes and intraorbital fat are unremarkable. Other: None. CT CERVICAL SPINE FINDINGS Alignment: Normal. Skull base and vertebrae: No acute fracture. No primary bone lesion or focal pathologic process. Soft tissues and spinal canal: Soft tissue gas is noted in the right lower neck and supraclavicular region. Disc levels: Marked loss of disc height noted C6-7 into a lesser degree at C7-T1. Endplate spurring noted at both levels. There is diffuse bilateral facet osteoarthritis with fusion of the C2-3 facets on the left. Upper chest: Tiny right apical pneumothorax evident. Other: None. IMPRESSION: 1. No acute intracranial abnormality. 2. No evidence for cervical spine fracture or subluxation. 3. Soft tissue gas in the right lower neck and supraclavicular region is associated with a tiny anterior pneumothorax identified in the visualized portion of the right lung apex. Patient had a CT chest performed at the same time as this exam. Please see that report for full details. Electronically Signed   By: Misty Stanley M.D.   On: 12/11/2021 10:34   CT Cervical Spine Wo Contrast  Result Date: 12/11/2021 CLINICAL DATA:  Patient fell 2 days ago.  Pain all over. EXAM: CT HEAD WITHOUT CONTRAST CT CERVICAL SPINE WITHOUT CONTRAST TECHNIQUE: Multidetector CT imaging of the head and cervical spine was performed following the standard protocol without intravenous contrast. Multiplanar CT image reconstructions of the cervical spine were also generated. RADIATION DOSE REDUCTION: This exam was performed according to the departmental dose-optimization program which includes automated exposure control, adjustment of the mA and/or kV according to patient size  and/or use of iterative reconstruction technique. COMPARISON:  No comparison studies available. FINDINGS: CT HEAD FINDINGS Brain: There is no evidence for acute hemorrhage, hydrocephalus, mass lesion, or abnormal extra-axial fluid collection. No definite CT evidence for acute infarction. Tiny lacunar infarct noted right cerebellum Vascular: No hyperdense vessel or unexpected calcification. Skull: No evidence for fracture. No worrisome lytic or sclerotic lesion. Sinuses/Orbits: The visualized paranasal sinuses and mastoid air cells are clear. Visualized portions of the globes and intraorbital fat are unremarkable. Other: None. CT CERVICAL SPINE FINDINGS Alignment: Normal. Skull base and vertebrae: No acute fracture. No primary bone lesion or focal pathologic process. Soft tissues and spinal canal: Soft tissue gas is noted in the right lower neck and supraclavicular region. Disc levels: Marked loss of disc height noted C6-7 into a lesser degree at C7-T1. Endplate spurring noted at both levels. There is diffuse bilateral facet osteoarthritis with fusion of the C2-3 facets on the left. Upper chest: Tiny right apical pneumothorax evident. Other: None. IMPRESSION: 1. No acute intracranial abnormality. 2. No evidence for cervical spine fracture or subluxation. 3. Soft tissue gas in the right lower neck and supraclavicular region is associated with a tiny anterior pneumothorax identified in the visualized portion of the right lung apex. Patient had a CT chest performed at the same time as this exam. Please see that report for full details. Electronically Signed   By: Misty Stanley M.D.   On: 12/11/2021 10:34   CT CHEST ABDOMEN PELVIS W CONTRAST  Result Date: 12/11/2021 CLINICAL DATA:  Chest and abdominal pain after fall 2 days ago EXAM: CT CHEST, ABDOMEN, AND PELVIS WITH CONTRAST TECHNIQUE: Multidetector CT imaging of the chest, abdomen and pelvis was performed following the standard protocol during bolus administration  of intravenous contrast. RADIATION DOSE REDUCTION: This exam was performed according to the departmental dose-optimization program which includes automated exposure control, adjustment of the mA  and/or kV according to patient size and/or use of iterative reconstruction technique. CONTRAST:  68m OMNIPAQUE IOHEXOL 300 MG/ML  SOLN COMPARISON:  Radiograph of same day.  CT of May 09, 2014. FINDINGS: CT CHEST FINDINGS Cardiovascular: Atherosclerosis of thoracic aorta is noted without aneurysm or dissection. Normal cardiac size. No pericardial effusion. Coronary artery calcifications are noted. Mediastinum/Nodes: No enlarged mediastinal, hilar, or axillary lymph nodes. Thyroid gland, trachea, and esophagus demonstrate no significant findings. Lungs/Pleura: Small right anterior pneumothorax is noted on the order of 15-20%. No pleural effusion is noted. Bilateral lower lobe atelectasis is noted. Musculoskeletal: Mildly displaced right fifth, sixth and seventh rib fractures are noted. Extensive subcutaneous emphysema is seen in the right lateral chest wall. CT ABDOMEN PELVIS FINDINGS Hepatobiliary: No focal liver abnormality is seen. No gallstones, gallbladder wall thickening, or biliary dilatation. Pancreas: Unremarkable. No pancreatic ductal dilatation or surrounding inflammatory changes. Spleen: Normal in size without focal abnormality. Adrenals/Urinary Tract: Adrenal glands are unremarkable. Kidneys are normal, without renal calculi, focal lesion, or hydronephrosis. Bladder is unremarkable. Stomach/Bowel: Stomach is within normal limits. Appendix appears normal. No evidence of bowel wall thickening, distention, or inflammatory changes. Vascular/Lymphatic: Aortic atherosclerosis. No enlarged abdominal or pelvic lymph nodes. Reproductive: Mild prostatic enlargement is noted. Other: No abdominal wall hernia or abnormality. No abdominopelvic ascites. Musculoskeletal: Severe degenerative changes seen involving the right hip  joint. No acute osseous abnormality is noted. IMPRESSION: Small right anterior and basilar pneumothorax is noted on the order of 15-20%. This pneumothorax was described on radiograph of same day. Mildly displaced fractures are seen involving the lateral portions of the right fifth, sixth and seventh ribs. Extensive subcutaneous emphysema is seen involving the right chest wall. Bibasilar atelectasis is noted, right greater than left. No traumatic abnormality seen in the abdomen or pelvis. Mild prostatic enlargement is noted. Severe degenerative joint disease of right hip. Aortic Atherosclerosis (ICD10-I70.0). Electronically Signed   By: JMarijo ConceptionM.D.   On: 12/11/2021 10:44   DG Chest Port 1 View  Result Date: 12/11/2021 CLINICAL DATA:  Status post fall.  Right lower anterior rib pain. EXAM: PORTABLE CHEST 1 VIEW COMPARISON:  10/28/2021 FINDINGS: Right infrahilar airspace disease likely reflecting atelectasis versus scarring. Left basilar atelectasis. Small right apical pneumothorax measuring less than 10%. No left pneumothorax. No pleural effusion. Stable cardiomediastinal silhouette. Soft tissue emphysema along the right lateral chest wall a and right side of the neck. Right lateral sixth rib fracture partially visualized. IMPRESSION: 1. Small right apical pneumothorax measuring less than 10%. 2. Right lateral rib fracture partially visualized. Critical Value/emergent results were called by telephone at the time of interpretation on 12/11/2021 at 8:54 am [facility] who verbally acknowledged these results. Electronically Signed   By: HKathreen DevoidM.D.   On: 12/11/2021 08:55   DG FEMUR, MIN 2 VIEWS RIGHT  Result Date: 12/11/2021 CLINICAL DATA:  Right leg immobility after fall 2 days ago. EXAM: RIGHT FEMUR 2 VIEWS COMPARISON:  Right hip x-rays dated May 23, 2021. Right knee x-rays dated May 21, 2021. FINDINGS: No acute fracture or dislocation. Unchanged severe right hip and  mild-to-moderate right knee osteoarthritis. IMPRESSION: 1. No acute osseous abnormality. 2. Unchanged severe right hip osteoarthritis. Electronically Signed   By: WTitus DubinM.D.   On: 12/11/2021 10:11    Procedures Procedures    Medications Ordered in ED Medications  amiodarone (PACERONE) tablet 200 mg (has no administration in time range)  furosemide (LASIX) tablet 20 mg (has no administration in  time range)  sacubitril-valsartan (ENTRESTO) 49-51 mg per tablet (has no administration in time range)  spironolactone (ALDACTONE) tablet 12.5 mg (has no administration in time range)  polyethylene glycol (MIRALAX / GLYCOLAX) packet 17 g (has no administration in time range)  potassium chloride (KLOR-CON) CR tablet 20 mEq (has no administration in time range)  Vitamin D (Ergocalciferol) (DRISDOL) capsule 50,000 Units (has no administration in time range)  0.9 %  sodium chloride infusion (has no administration in time range)  acetaminophen (TYLENOL) tablet 1,000 mg (has no administration in time range)  traMADol (ULTRAM) tablet 50 mg (has no administration in time range)  methocarbamol (ROBAXIN) tablet 500 mg (has no administration in time range)  morphine (PF) 2 MG/ML injection 1-2 mg (has no administration in time range)  ondansetron (ZOFRAN-ODT) disintegrating tablet 4 mg (has no administration in time range)    Or  ondansetron (ZOFRAN) injection 4 mg (has no administration in time range)  insulin aspart (novoLOG) injection 0-15 Units (has no administration in time range)  morphine (PF) 4 MG/ML injection 4 mg (4 mg Intravenous Given 12/11/21 0846)  sodium chloride 0.9 % bolus 500 mL (0 mLs Intravenous Stopped 12/11/21 1026)  iohexol (OMNIPAQUE) 300 MG/ML solution 80 mL (80 mLs Intravenous Contrast Given 12/11/21 1026)    ED Course/ Medical Decision Making/ A&P Clinical Course as of 12/11/21 1314  Thu Dec 11, 8220  [id number] 71 year old male on warfarin.  After fall yesterday complaining of  chest back hip pain.  Neuro intact.  Getting imaging and labs pain control.  Disposition per results of testing. [MB]  [facility] trauma  [BM]  [place] [BM]    Clinical Course User Index [BM] Deliah Boston, PA-C [MB] Hayden Rasmussen, MD                           Medical Decision Making 71 year old male presented 1.5 days after he fell off of his porch landing on his right hip and right ribs.  He is on Coumadin for atrial fibrillation.  Denies any head injury.  His pain has been worsening.  On exam he is diffusely tender to the right of the chest as well as the right hip.  He is neurovascular intact to all 4 extremities.  Cardiopulmonary exam is unremarkable on initial evaluation.  As patient is on Coumadin we will proceed with CT imaging to rule out intrathoracic or intra-abdominal hemorrhages.  Trauma order set utilized, patient does not meet code criteria at this point.  Differential at this point includes but not limited to traumatic internal hemorrhages, fractures, dislocations.   Amount and/or Complexity of Data Reviewed External Data Reviewed: labs.    Details: I reviewed external labs which showed patient's most recent INR was 3.5. Labs: ordered.    Details: CMP shows creatinine of 2.0, unable to obtain contrasted scans.  No emergent electrolyte derangement, LFT elevations or gap. CBC shows mild leukocytosis of 12.3, no anemia or thrombocytopenia. Urinalysis shows moderate hemoglobi and greater than 500 glucose, no evidence for infection. INR 3.4. COVID test/influenza panel negative. Lactic within normal limits. Ethano pending. Blood bank syncopal spending. Radiology: ordered.    Details: I personally reviewed patient's chest x-ray agree with radiologist interpretation patient has a very subtle right-sided pneumothorax. I personally reviewed patient's pelvis x-ray, agree with radiology interpretation the patient has a right femoral neck fracture. I personally  reviewed patient's right femur x-ray, I agree with radiology interpretation, negative except  for above. I reviewed radiology interpretation of patient's CT chest abdomen pelvis, 15-20% pneumothorax along with mildly displaced fractures of the right fifth-6-seventh ribs along with subcutaneous emphysema.  No other acute findings noted. I personally viewed patient's CT head, I agree with radiology interpretation, no obvious intracranial hemorrhage. I reviewed radiology interpretation patient's CT cervical spine, no acute fractures or dislocations noted.  Subcutaneous air present in the neck with pneumothorax. ECG/medicine tests: ordered.    Details: Reviewed by Dr. Melina Copa without acute ischemic changes.  Risk Prescription drug management. Decision regarding hospitalization.  Critical Care Total time providing critical care: 30-74 minutes (Trauma)  ------ I consulted trauma surgery and spoke to [facility] regarding CT findings.  Trauma plans to evaluate the patient and admit to their service, they asked that I consult orthopedic surgery in regards to the hip fracture.  Chest tube placed by trauma surgery.  I reevaluate the patient he is resting company bed no acute distress, pain controlled following morphine, declined additional pain medication.  Patient states understanding of findings above and he is agreeable for admission.  I consulted with orthopedic surgery and spoke to Silvestre Gunner, PA-C, who plans to evaluate the patient in regard to the hip fracture. ----- Patient was admitted to trauma surgery for further management.  Patient was seen and evaluated by attending physician Dr. Melina Copa during this visit who agrees with management.     Note: Portions of this report may have been transcribed using voice recognition software. Every effort was made to ensure accuracy; however, inadvertent computerized transcription errors may still be present.         Final Clinical  Impression(s) / ED Diagnoses Final diagnoses:  Trauma  Traumatic pneumothorax, initial encounter    Rx / DC Orders ED Discharge Orders     None         Gari Crown 12/11/21 1314    Gari Crown 12/11/21 1316    Hayden Rasmussen, MD 12/11/21 2026

## 2021-12-11 NOTE — Procedures (Signed)
Chest tube insertion  Date/Time: 12/11/2021 3:04 PM Performed by: Georganna Skeans, MD Authorized by: Georganna Skeans, MD   Consent:    Consent obtained:  Verbal   Consent given by:  Patient Pre-procedure details:    Skin preparation:  ChloraPrep Anesthesia (see MAR for exact dosages):    Anesthesia method:  Local infiltration Procedure details:    Placement location:  R lateral   Scalpel size:  11   Tube size (Fr):  Minicatheter   Technique: Seldinger     Ultrasound guidance: no     Tension pneumothorax: no     Tube connected to:  Suction   Dressing:  Xeroform gauze and 4x4 sterile gauze Post-procedure details:    Post-insertion x-ray findings: tube in good position     Patient tolerance of procedure:  Tolerated well, no immediate complications Georganna Skeans, MD, MPH, FACS Please use [email or web address] to contact on call provider

## 2021-12-11 NOTE — ED Notes (Signed)
Pt tolerated chest tube placement well. Chest tube placed to suction. Portable chest xray at bedside.

## 2021-12-11 NOTE — ED Triage Notes (Signed)
Patient BIBEMS from home. C/o right sided back pain after mechanical fall 2 days ago. Cursing at this RN in triage.  Patient was ambulatory with EMS but is now stating his right leg is completely immobile due to pain.  No meds PTA  150/86 50 HR 95% RA

## 2021-12-12 ENCOUNTER — Ambulatory Visit: Payer: Medicare HMO | Admitting: Family

## 2021-12-12 ENCOUNTER — Inpatient Hospital Stay (HOSPITAL_COMMUNITY): Payer: Medicare HMO

## 2021-12-12 DIAGNOSIS — M1611 Unilateral primary osteoarthritis, right hip: Secondary | ICD-10-CM

## 2021-12-12 LAB — CBC
HCT: 37.6 % — ABNORMAL LOW (ref 39.0–52.0)
Hemoglobin: 12.1 g/dL — ABNORMAL LOW (ref 13.0–17.0)
MCH: 28.5 pg (ref 26.0–34.0)
MCHC: 32.2 g/dL (ref 30.0–36.0)
MCV: 88.7 fL (ref 80.0–100.0)
Platelets: 157 10*3/uL (ref 150–400)
RBC: 4.24 MIL/uL (ref 4.22–5.81)
RDW: 16.5 % — ABNORMAL HIGH (ref 11.5–15.5)
WBC: 8.5 10*3/uL (ref 4.0–10.5)
nRBC: 0 % (ref 0.0–0.2)

## 2021-12-12 LAB — PROTIME-INR
INR: 4.1 (ref 0.8–1.2)
Prothrombin Time: 39.9 seconds — ABNORMAL HIGH (ref 11.4–15.2)

## 2021-12-12 LAB — BASIC METABOLIC PANEL
Anion gap: 8 (ref 5–15)
BUN: 36 mg/dL — ABNORMAL HIGH (ref 8–23)
CO2: 26 mmol/L (ref 22–32)
Calcium: 8.4 mg/dL — ABNORMAL LOW (ref 8.9–10.3)
Chloride: 104 mmol/L (ref 98–111)
Creatinine, Ser: 1.86 mg/dL — ABNORMAL HIGH (ref 0.61–1.24)
GFR, Estimated: 38 mL/min — ABNORMAL LOW (ref >60)
Glucose, Bld: 127 mg/dL — ABNORMAL HIGH (ref 70–99)
Potassium: 4.4 mmol/L (ref 3.5–5.1)
Sodium: 138 mmol/L (ref 135–145)

## 2021-12-12 LAB — GLUCOSE, CAPILLARY
Glucose-Capillary: 162 mg/dL — ABNORMAL HIGH (ref 70–99)
Glucose-Capillary: 133 mg/dL — ABNORMAL HIGH (ref 70–99)

## 2021-12-12 MED ORDER — INSULIN GLARGINE-YFGN 100 UNIT/ML ~~LOC~~ SOLN
8.0000 [IU] | Freq: Every day | SUBCUTANEOUS | Status: DC
  Administered 2021-12-13 – 2021-12-15 (×3): 8 [IU] via SUBCUTANEOUS
  Filled 2021-12-12 (×4): qty 0.08

## 2021-12-12 MED ORDER — EMPAGLIFLOZIN 10 MG PO TABS
10.0000 mg | ORAL_TABLET | Freq: Every day | ORAL | Status: DC
  Administered 2021-12-13 – 2021-12-15 (×3): 10 mg via ORAL
  Filled 2021-12-12 (×4): qty 1

## 2021-12-12 NOTE — Progress Notes (Addendum)
Progress Note     Subjective: Still having pain in his ribs. Denies SHOB. Refused to work with OT this am but says he will get OOB and work with therapies later today. Tolerating diet   Objective: Vital signs in last 24 hours: Temp:  [97.4 F (36.3 C)-98.7 F (37.1 C)] 97.8 F (36.6 C) (02/03 0740) Pulse Rate:  [41-99] 74 (02/03 0740) Resp:  [15-24] 16 (02/03 0740) BP: (95-143)/(56-102) 115/70 (02/03 0740) SpO2:  [86 %-97 %] 97 % (02/03 0740) Weight:  [94.4 kg] 94.4 kg (02/02 1700)    Intake/Output from previous day: 02/02 0701 - 02/03 0700 In: -  Out: 626 [Urine:600] Intake/Output this shift: No intake/output data recorded.  PE: General: pleasant, WD, male who is laying in bed in NAD HEENT: head is normocephalic, atraumatic.  Mouth is pink and moist Heart: regular, rate, and rhythm.  Palpable radial pulses bilaterally Lungs: CTAB, no wheezes, rhonchi, or rales noted.  Respiratory effort nonlabored. CT in place on suction with SS output (60 ml total) in canister. Dressing c/d/I. No air leak, placed on WS Abd: soft, NT, ND MSK: all 4 extremities are symmetrical with no cyanosis, clubbing, or edema. Skin: warm and dry Psych: A&Ox3 with an appropriate affect.    Lab Results:  Recent Labs    12/11/21 0851 12/11/21 0901 12/12/21 0151  WBC 12.3*  --  8.5  HGB 13.8 15.6 12.1*  HCT 44.6 46.0 37.6*  PLT 186  --  157   BMET Recent Labs    12/11/21 0851 12/11/21 0901 12/12/21 0151  NA 136 138 138  K 4.7 4.6 4.4  CL 102 103 104  CO2 22  --  26  GLUCOSE 169* 165* 127*  BUN 39* 39* 36*  CREATININE 2.02* 2.00* 1.86*  CALCIUM 9.1  --  8.4*   PT/INR Recent Labs    12/11/21 0851 12/12/21 0151  LABPROT 34.5* 39.9*  INR 3.4* 4.1*   CMP     Component Value Date/Time   NA 138 12/12/2021 0151   K 4.4 12/12/2021 0151   CL 104 12/12/2021 0151   CO2 26 12/12/2021 0151   GLUCOSE 127 (H) 12/12/2021 0151   BUN 36 (H) 12/12/2021 0151   CREATININE 1.86 (H)  12/12/2021 0151   CREATININE 1.18 06/13/2021 0941   CALCIUM 8.4 (L) 12/12/2021 0151   PROT 8.0 12/11/2021 0851   ALBUMIN 3.9 12/11/2021 0851   AST 30 12/11/2021 0851   ALT 42 12/11/2021 0851   ALKPHOS 32 (L) 12/11/2021 0851   BILITOT 1.0 12/11/2021 0851   GFRNONAA 38 (L) 12/12/2021 0151   GFRAA >60 08/06/2020 1418   Lipase  No results found for: LIPASE     Studies/Results: DG Pelvis 1-2 Views  Result Date: 12/11/2021 CLINICAL DATA:  Trauma right back pain after fall 2 days ago. EXAM: PELVIS - 1-2 VIEW COMPARISON:  None. FINDINGS: Irregularity of the femoral neck with superior displacement of the femur concerning for subcapital femoral neck fracture. Diffuse osteopenia. Advanced right hip osteoarthritis. Multilevel degenerative disc disease of the lumbar spine. IMPRESSION: Right subcapital femoral neck fracture. Electronically Signed   By: Keane Police D.O.   On: 12/11/2021 10:17   CT HEAD WO CONTRAST (5MM)  Result Date: 12/11/2021 CLINICAL DATA:  Patient fell 2 days ago.  Pain all over. EXAM: CT HEAD WITHOUT CONTRAST CT CERVICAL SPINE WITHOUT CONTRAST TECHNIQUE: Multidetector CT imaging of the head and cervical spine was performed following the standard protocol without intravenous contrast. Multiplanar CT  image reconstructions of the cervical spine were also generated. RADIATION DOSE REDUCTION: This exam was performed according to the departmental dose-optimization program which includes automated exposure control, adjustment of the mA and/or kV according to patient size and/or use of iterative reconstruction technique. COMPARISON:  No comparison studies available. FINDINGS: CT HEAD FINDINGS Brain: There is no evidence for acute hemorrhage, hydrocephalus, mass lesion, or abnormal extra-axial fluid collection. No definite CT evidence for acute infarction. Tiny lacunar infarct noted right cerebellum Vascular: No hyperdense vessel or unexpected calcification. Skull: No evidence for fracture. No  worrisome lytic or sclerotic lesion. Sinuses/Orbits: The visualized paranasal sinuses and mastoid air cells are clear. Visualized portions of the globes and intraorbital fat are unremarkable. Other: None. CT CERVICAL SPINE FINDINGS Alignment: Normal. Skull base and vertebrae: No acute fracture. No primary bone lesion or focal pathologic process. Soft tissues and spinal canal: Soft tissue gas is noted in the right lower neck and supraclavicular region. Disc levels: Marked loss of disc height noted C6-7 into a lesser degree at C7-T1. Endplate spurring noted at both levels. There is diffuse bilateral facet osteoarthritis with fusion of the C2-3 facets on the left. Upper chest: Tiny right apical pneumothorax evident. Other: None. IMPRESSION: 1. No acute intracranial abnormality. 2. No evidence for cervical spine fracture or subluxation. 3. Soft tissue gas in the right lower neck and supraclavicular region is associated with a tiny anterior pneumothorax identified in the visualized portion of the right lung apex. Patient had a CT chest performed at the same time as this exam. Please see that report for full details. Electronically Signed   By: Misty Stanley M.D.   On: 12/11/2021 10:34   CT Cervical Spine Wo Contrast  Result Date: 12/11/2021 CLINICAL DATA:  Patient fell 2 days ago.  Pain all over. EXAM: CT HEAD WITHOUT CONTRAST CT CERVICAL SPINE WITHOUT CONTRAST TECHNIQUE: Multidetector CT imaging of the head and cervical spine was performed following the standard protocol without intravenous contrast. Multiplanar CT image reconstructions of the cervical spine were also generated. RADIATION DOSE REDUCTION: This exam was performed according to the departmental dose-optimization program which includes automated exposure control, adjustment of the mA and/or kV according to patient size and/or use of iterative reconstruction technique. COMPARISON:  No comparison studies available. FINDINGS: CT HEAD FINDINGS Brain: There is  no evidence for acute hemorrhage, hydrocephalus, mass lesion, or abnormal extra-axial fluid collection. No definite CT evidence for acute infarction. Tiny lacunar infarct noted right cerebellum Vascular: No hyperdense vessel or unexpected calcification. Skull: No evidence for fracture. No worrisome lytic or sclerotic lesion. Sinuses/Orbits: The visualized paranasal sinuses and mastoid air cells are clear. Visualized portions of the globes and intraorbital fat are unremarkable. Other: None. CT CERVICAL SPINE FINDINGS Alignment: Normal. Skull base and vertebrae: No acute fracture. No primary bone lesion or focal pathologic process. Soft tissues and spinal canal: Soft tissue gas is noted in the right lower neck and supraclavicular region. Disc levels: Marked loss of disc height noted C6-7 into a lesser degree at C7-T1. Endplate spurring noted at both levels. There is diffuse bilateral facet osteoarthritis with fusion of the C2-3 facets on the left. Upper chest: Tiny right apical pneumothorax evident. Other: None. IMPRESSION: 1. No acute intracranial abnormality. 2. No evidence for cervical spine fracture or subluxation. 3. Soft tissue gas in the right lower neck and supraclavicular region is associated with a tiny anterior pneumothorax identified in the visualized portion of the right lung apex. Patient had a CT chest performed at the  same time as this exam. Please see that report for full details. Electronically Signed   By: Misty Stanley M.D.   On: 12/11/2021 10:34   CT CHEST ABDOMEN PELVIS W CONTRAST  Result Date: 12/11/2021 CLINICAL DATA:  Chest and abdominal pain after fall 2 days ago EXAM: CT CHEST, ABDOMEN, AND PELVIS WITH CONTRAST TECHNIQUE: Multidetector CT imaging of the chest, abdomen and pelvis was performed following the standard protocol during bolus administration of intravenous contrast. RADIATION DOSE REDUCTION: This exam was performed according to the departmental dose-optimization program which  includes automated exposure control, adjustment of the mA and/or kV according to patient size and/or use of iterative reconstruction technique. CONTRAST:  32mL OMNIPAQUE IOHEXOL 300 MG/ML  SOLN COMPARISON:  Radiograph of same day.  CT of May 09, 2014. FINDINGS: CT CHEST FINDINGS Cardiovascular: Atherosclerosis of thoracic aorta is noted without aneurysm or dissection. Normal cardiac size. No pericardial effusion. Coronary artery calcifications are noted. Mediastinum/Nodes: No enlarged mediastinal, hilar, or axillary lymph nodes. Thyroid gland, trachea, and esophagus demonstrate no significant findings. Lungs/Pleura: Small right anterior pneumothorax is noted on the order of 15-20%. No pleural effusion is noted. Bilateral lower lobe atelectasis is noted. Musculoskeletal: Mildly displaced right fifth, sixth and seventh rib fractures are noted. Extensive subcutaneous emphysema is seen in the right lateral chest wall. CT ABDOMEN PELVIS FINDINGS Hepatobiliary: No focal liver abnormality is seen. No gallstones, gallbladder wall thickening, or biliary dilatation. Pancreas: Unremarkable. No pancreatic ductal dilatation or surrounding inflammatory changes. Spleen: Normal in size without focal abnormality. Adrenals/Urinary Tract: Adrenal glands are unremarkable. Kidneys are normal, without renal calculi, focal lesion, or hydronephrosis. Bladder is unremarkable. Stomach/Bowel: Stomach is within normal limits. Appendix appears normal. No evidence of bowel wall thickening, distention, or inflammatory changes. Vascular/Lymphatic: Aortic atherosclerosis. No enlarged abdominal or pelvic lymph nodes. Reproductive: Mild prostatic enlargement is noted. Other: No abdominal wall hernia or abnormality. No abdominopelvic ascites. Musculoskeletal: Severe degenerative changes seen involving the right hip joint. No acute osseous abnormality is noted. IMPRESSION: Small right anterior and basilar pneumothorax is noted on the order of 15-20%.  This pneumothorax was described on radiograph of same day. Mildly displaced fractures are seen involving the lateral portions of the right fifth, sixth and seventh ribs. Extensive subcutaneous emphysema is seen involving the right chest wall. Bibasilar atelectasis is noted, right greater than left. No traumatic abnormality seen in the abdomen or pelvis. Mild prostatic enlargement is noted. Severe degenerative joint disease of right hip. Aortic Atherosclerosis (ICD10-I70.0). Electronically Signed   By: Marijo Conception M.D.   On: 12/11/2021 10:44   DG Chest Port 1 View  Result Date: 12/12/2021 CLINICAL DATA:  Encounter for right pneumothorax. EXAM: PORTABLE CHEST 1 VIEW COMPARISON:  February 2, 23. FINDINGS: Right chest tube in place. No visible pneumothorax on this semi erect radiograph. Subcutaneous emphysema along the right chest wall. Low lung volumes. Similar bibasilar opacities. No definite pleural effusion. Similar mild enlargement the cardiac silhouette. Redemonstrated right rib fractures, better characterized on prior CT. IMPRESSION: 1. Right chest tube in place without visible pneumothorax. 2. Similar low lung volumes and bibasilar opacities, most likely atelectasis. Electronically Signed   By: Margaretha Sheffield M.D.   On: 12/12/2021 08:10   DG Chest Portable 1 View  Result Date: 12/11/2021 CLINICAL DATA:  Chest tube insertion. EXAM: PORTABLE CHEST 1 VIEW COMPARISON:  CT chest 12/11/2021, AP chest 12/11/2021 chest two views 10/28/2021 FINDINGS: New small pigtail catheter with tip overlying the medial right hemithorax. Interval decrease and near resolution  of the prior tiny right apical pneumothorax. Medial right lower lung greater than left lower lung linear subsegmental atelectasis. No definite pleural effusion. Cardiac silhouette again appears mildly enlarged. Mediastinal contours are grossly within normal limits. Lateral right sixth rib mildly displaced fracture is again noted. The additional right  fifth and seventh rib fractures seen on prior CT are less well visualized. Right lateral chest wall subcutaneous air is again seen. IMPRESSION:: IMPRESSION: 1. Interval placement of right-sided chest tube. Interval decrease and near resolution of the prior tiny right apical pneumothorax. 2. Right lateral acute rib fractures are again noted on radiographs and prior CT. Electronically Signed   By: Yvonne Kendall M.D.   On: 12/11/2021 13:12   DG Chest Port 1 View  Result Date: 12/11/2021 CLINICAL DATA:  Status post fall.  Right lower anterior rib pain. EXAM: PORTABLE CHEST 1 VIEW COMPARISON:  10/28/2021 FINDINGS: Right infrahilar airspace disease likely reflecting atelectasis versus scarring. Left basilar atelectasis. Small right apical pneumothorax measuring less than 10%. No left pneumothorax. No pleural effusion. Stable cardiomediastinal silhouette. Soft tissue emphysema along the right lateral chest wall a and right side of the neck. Right lateral sixth rib fracture partially visualized. IMPRESSION: 1. Small right apical pneumothorax measuring less than 10%. 2. Right lateral rib fracture partially visualized. Critical Value/emergent results were called by telephone at the time of interpretation on 12/11/2021 at 8:54 am to provider [facility] , who verbally acknowledged these results. Electronically Signed   By: Kathreen Devoid M.D.   On: 12/11/2021 08:55   DG FEMUR, MIN 2 VIEWS RIGHT  Result Date: 12/11/2021 CLINICAL DATA:  Right leg immobility after fall 2 days ago. EXAM: RIGHT FEMUR 2 VIEWS COMPARISON:  Right hip x-rays dated May 23, 2021. Right knee x-rays dated May 21, 2021. FINDINGS: No acute fracture or dislocation. Unchanged severe right hip and mild-to-moderate right knee osteoarthritis. IMPRESSION: 1. No acute osseous abnormality. 2. Unchanged severe right hip osteoarthritis. Electronically Signed   By: Titus Dubin M.D.   On: 12/11/2021 10:11    Anti-infectives: Anti-infectives (From  admission, onward)    None        Assessment/Plan  Fall down 3 steps R 5-7 rib fxs with PTX - CT placed in ED 2/2. Am CXR without PTX and minimal output. WS this am and recheck cxr. Multimodal pain control and pulmonary toilet with IS R hip OA - read as fracture on pelvic xray but femur xray and CT abd/pel negative. ortho consulted and note right hip OA which could benefit from THA. F/u Dr. Sammuel Hines. WBAT RLE HTN - home meds CAD A fib - on amiodarone baseline, hold coumadin, INR 3.4 on admit, now 4.1 T2DM - resume home meds CKD 3 - creatinine 2.02 on admit, baseline 1.60, IVF, now 1.86   FEN: carb mod, IVF - dec to 50 ml/hr ID: none currently VTE: hold coumadin, SCDs   I reviewed [facility] orthopedic notes, last 24 h vitals and pain scores, last 48 h intake and output, last 24 h labs and trends, and last 24 h imaging results.  This care required moderate level of medical decision making.    LOS: 1 day   [facility] 12/12/2021, 9:35 AM Please see Amion for pager number during day hours 7:00am-4:30pm

## 2021-12-12 NOTE — Consult Note (Signed)
ORTHOPAEDIC CONSULTATION  REQUESTING PHYSICIAN: Md, Trauma, MD  Chief Complaint: Right hip pain  HPI: [name] is a 71 y.o. male who presents with right hip pain after a fall off of his porch.  Initially he was trying to present to the hospital subsequently presents to the hospital when he is in significant pain with limited mobility.  There is a question of femoral neck fracture in the emergency room although CT scan ruled this out.  He has had chronic right hip pain for several years involving this right hip.    Past Medical History:  Diagnosis Date   A-fib [facility])    a. (06/04/14) TEE-DC-CV; succesful; large LA 6.2 cm   ADHD    Ascites    Athlete's foot    Chronic systolic heart failure ([place])    a. 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, unspecified CKD stage, unspecified whether long term insulin use (HCC)    Heart murmur    HTN (hypertension)    OSA (obstructive sleep apnea)    PVD (peripheral vascular disease) (HCC)    s/p great toe amputation   Past Surgical History:  Procedure Laterality Date   ABDOMINAL AORTOGRAM W/LOWER EXTREMITY N/A 05/19/2021   Procedure: ABDOMINAL AORTOGRAM W/LOWER EXTREMITY;  Surgeon: Marty Heck, MD;  Location: [place] [facility];  Service: Cardiovascular;  Laterality: N/A;   AMPUTATION Left 05/22/2021   Procedure: LEFT FIFTH TOE AMPUTATION;  Surgeon: Marty Heck, MD;  Location: [place];  Service: Vascular;  Laterality: Left;   CARDIOVERSION N/A 06/04/2014   Procedure: CARDIOVERSION;  Surgeon: Larey Dresser, MD;  Location: [facility] ENDOSCOPY;  Service: Cardiovascular;  Laterality: N/A;   CARDIOVERSION N/A 11/06/2021   Procedure: CARDIOVERSION;  Surgeon: Larey Dresser, MD;  Location: [facility] ENDOSCOPY;  Service: Cardiovascular;   Laterality: N/A;   NOSE SURGERY     Nasal septum surgery   PERIPHERAL VASCULAR INTERVENTION Left 05/19/2021   Procedure: PERIPHERAL VASCULAR INTERVENTION;  Surgeon: Marty Heck, MD;  Location: [place] [facility];  Service: Cardiovascular;  Laterality: Left;   TEE WITHOUT CARDIOVERSION N/A 06/04/2014   Procedure: TRANSESOPHAGEAL ECHOCARDIOGRAM (TEE);  Surgeon: Larey Dresser, MD;  Location: [place];  Service: Cardiovascular;  Laterality: N/A;   TEE WITHOUT CARDIOVERSION N/A 01/06/2016   Procedure: TRANSESOPHAGEAL ECHOCARDIOGRAM (TEE);  Surgeon: Fay Records, MD;  Location: [place];  Service: Cardiovascular;  Laterality: N/A;   TEE WITHOUT CARDIOVERSION N/A 11/06/2021   Procedure: TRANSESOPHAGEAL ECHOCARDIOGRAM (TEE);  Surgeon: Larey Dresser, MD;  Location[facility] END[facility]COPY;  Service: Cardiovascular;  Laterality: N/A;   VASECTOMY     Social History   Socioeconomic History   Marital status: Single    Spouse name: Not on file   Number of children: Not on file   Years of education: Not on file   Highest education level: Not on file  Occupational History   Occupation: retired  Tobacco Use   Smoking status: Some Days    Years: 52.00    Types: Cigarettes, Cigars    Last attempt to quit: 05/2021    Years since quitting: 0.5   Smokeless tobacco: Never  Substance and Sexual Activity   Alcohol use: Not Currently   Drug use: Never   Sexual activity: Not on file  Other Topics Concern   Not on file  Social History Narrative   ** Merged History Encounter ** Lives in [place] by himself. Retired from [employer] and [employer] for [employer].       Tobacco use, amount per day now:   Past tobacco use, amount per day:   How many years did you use tobacco:   Alcohol use (drinks per week): N/A   Diet:   Do you drink/eat things with caffeine:   Marital status:  Divorced                                What year were you married? 2003   Do you live in a house, apartment,  assisted living, condo, trailer, etc.? House   Is it one or more stories? One   How many persons live in your home?   Do you have pets in your home?( please list) N/A   Highest Level of education completed? Bachelors Degree   Current or past profession: Teacher-Special Ed   Do you exercise?  Yes                                Type and how often? Daily squats, and push ups.   Do you have a living will? No   Do you have a DNR form?  No                                 If not, do you want to discuss one?   Do you have signed POA/HPOA forms?  No                      If so, please bring to you appointment      Do you have any difficulty bathing or dressing yourself? Bathing only if pants not shirts/not shorts.   Do you have any difficulty preparing food or eating? No   Do you have any difficulty managing your medications? No   Do you have any difficulty managing your finances? No   Do you have any difficulty affording your medications? No   Social Determinants of Radio broadcast assistant Strain: Not on file  Food Insecurity: No Food Insecurity   Worried About Charity fundraiser in the Last Year: Never true   Ran Out of Food in the Last Year: Never true  Transportation Needs: No Transportation Needs   Lack of Transportation (Medical): No   Lack of Transportation (Non-Medical): No  Physical Activity: Not on file  Stress: Not on file  Social Connections: Not on file   Family History  Problem Relation Age of Onset   Heart attack Mother        deceased   Diabetes Mother    Heart attack Sister    - negative except otherwise stated in the family history section No Known Allergies Prior to Admission medications   Medication Sig Start Date End Date Taking? Authorizing Provider  amiodarone (PACERONE) 200 MG tablet Take 1 tablet (200 mg total) by mouth daily. 12/09/21 01/08/22 Yes Ngetich, Dinah C, NP  Cyanocobalamin (VITAMIN B-12 PO) Take 1 capsule by mouth daily.   Yes [provider]  diclofenac Sodium (VOLTAREN) 1 % GEL Apply 2 g topically 4 (four) times daily. Patient  taking differently: Apply 2 g topically 4 (four) times daily as needed (arthritis pain). 06/26/21  Yes Ngetich, Dinah C, NP  empagliflozin (JARDIANCE) 10 MG TABS tablet Take 1 tablet (10 mg total) by mouth daily. 12/09/21 01/08/22 Yes Ngetich, Dinah C, NP  furosemide (LASIX) 20 MG tablet Take 1 tablet (20 mg total) by mouth 2 (two) times daily. May take an additional 2m daily as needed. 10/21/21  Yes [place], [place] FNP  Green Tea, Camellia sinensis, (GREEN TEA EXTRACT PO) Take 1 Dose by mouth daily.   Yes [provider]  insulin glargine (LANTUS) 100 UNIT/ML injection Inject 0.08 mLs (8 Units total) into the skin daily. 06/26/21 11/14/22 Yes Ngetich, Dinah C, NP  KRILL OIL OMEGA-3 PO Take 1 tablet by mouth daily.   Yes [provider]  polyethylene glycol powder (GLYCOLAX/MIRALAX) 17 GM/SCOOP powder Take 17 g by mouth daily. Hold for loose stool Patient taking differently: Take 17 g by mouth daily as needed for mild constipation. Hold for loose stool 06/26/21  Yes Ngetich, Dinah C, NP  potassium chloride (KLOR-CON) 10 MEQ tablet TAKE 2 TABLETS(20 MEQ) BY MOUTH TWICE DAILY Patient taking differently: 20 mEq 2 (two) times daily. 12/10/21  Yes Ngetich, Dinah C, NP  sacubitril-valsartan (ENTRESTO) 49-51 MG Take 1 tablet by mouth 2 (two) times daily. 12/09/21 01/08/22 Yes Ngetich, Dinah C, NP  Sodium Hyaluronate, oral, (HYALURONIC ACID PO) Take 1 tablet by mouth daily. Vit c, 120 mg ,magnesium,119m   Yes [provider]  spironolactone (ALDACTONE) 25 MG tablet Take 0.5 tablet (12.5 mg total) by mouth daily. 11/20/21 12/20/21 Yes Clegg, Amy D, NP  Vitamin D, Ergocalciferol, (DRISDOL) 1.25 MG (50000 UNIT) CAPS capsule TAKE 1 CAPSULE BY MOUTH EVERY THURSDAY 09/09/21  Yes Ngetich, Dinah C, NP  warfarin (COUMADIN) 6 MG tablet Take 1 tablet (6 mg total) by mouth daily. Take 2-1 tablets  daily. Patient taking differently: Take 3-6 mg by mouth See admin instructions. Take 20m72mn Tuesdays,  Thursday and Saturdays, then 6mg56m  all other days. 07/24/21  Yes Camnitz, Will MartHassell Done  atorvastatin (LIPITOR) 10 MG tablet TAKE 1 TABLET(10 MG) BY MOUTH DAILY Patient not taking: Reported on 12/11/2021 12/11/21   CainWaynetta Sandy  blood glucose meter kit and supplies Dispense based on patient and insurance preference. Use up to four times daily as directed. (FOR ICD-10 E10.9, E11.9). 10/01/21   GreeGerlene Fee   DG Pelvis 1-2 Views  Result Date: 12/11/2021 CLINICAL DATA:  Trauma right back pain after fall 2 days ago. EXAM: PELVIS - 1-2 VIEW COMPARISON:  None. FINDINGS: Irregularity of the femoral neck with superior displacement of the femur concerning for subcapital femoral neck fracture. Diffuse osteopenia. Advanced right hip osteoarthritis. Multilevel degenerative disc disease of the lumbar spine. IMPRESSION: Right subcapital femoral neck fracture. Electronically Signed   By: ImraKeane Police.   On: 12/11/2021 10:17   CT HEAD WO CONTRAST (5MM)  Result Date: 12/11/2021 CLINICAL DATA:  Patient fell 2 days ago.  Pain all over. EXAM: CT HEAD WITHOUT CONTRAST CT CERVICAL SPINE WITHOUT CONTRAST TECHNIQUE: Multidetector CT imaging of the head and cervical spine was performed following the standard protocol without intravenous contrast. Multiplanar CT image reconstructions of the cervical spine were also generated. RADIATION DOSE REDUCTION: This exam was performed according to the departmental dose-optimization program which includes automated exposure control, adjustment of the mA and/or kV according to patient size and/or use of iterative reconstruction technique. COMPARISON:  No comparison  studies available. FINDINGS: CT HEAD FINDINGS Brain: There is no evidence for acute hemorrhage, hydrocephalus, mass lesion, or abnormal extra-axial fluid collection. No definite CT evidence for acute  infarction. Tiny lacunar infarct noted right cerebellum Vascular: No hyperdense vessel or unexpected calcification. Skull: No evidence for fracture. No worrisome lytic or sclerotic lesion. Sinuses/Orbits: The visualized paranasal sinuses and mastoid air cells are clear. Visualized portions of the globes and intraorbital fat are unremarkable. Other: None. CT CERVICAL SPINE FINDINGS Alignment: Normal. Skull base and vertebrae: No acute fracture. No primary bone lesion or focal pathologic process. Soft tissues and spinal canal: Soft tissue gas is noted in the right lower neck and supraclavicular region. Disc levels: Marked loss of disc height noted C6-7 into a lesser degree at C7-T1. Endplate spurring noted at both levels. There is diffuse bilateral facet osteoarthritis with fusion of the C2-3 facets on the left. Upper chest: Tiny right apical pneumothorax evident. Other: None. IMPRESSION: 1. No acute intracranial abnormality. 2. No evidence for cervical spine fracture or subluxation. 3. Soft tissue gas in the right lower neck and supraclavicular region is associated with a tiny anterior pneumothorax identified in the visualized portion of the right lung apex. Patient had a CT chest performed at the same time as this exam. Please see that report for full details. Electronically Signed   By: Misty Stanley M.D.   On: 12/11/2021 10:34   CT Cervical Spine Wo Contrast  Result Date: 12/11/2021 CLINICAL DATA:  Patient fell 2 days ago.  Pain all over. EXAM: CT HEAD WITHOUT CONTRAST CT CERVICAL SPINE WITHOUT CONTRAST TECHNIQUE: Multidetector CT imaging of the head and cervical spine was performed following the standard protocol without intravenous contrast. Multiplanar CT image reconstructions of the cervical spine were also generated. RADIATION DOSE REDUCTION: This exam was performed according to the departmental dose-optimization program which includes automated exposure control, adjustment of the mA and/or kV according to  patient size and/or use of iterative reconstruction technique. COMPARISON:  No comparison studies available. FINDINGS: CT HEAD FINDINGS Brain: There is no evidence for acute hemorrhage, hydrocephalus, mass lesion, or abnormal extra-axial fluid collection. No definite CT evidence for acute infarction. Tiny lacunar infarct noted right cerebellum Vascular: No hyperdense vessel or unexpected calcification. Skull: No evidence for fracture. No worrisome lytic or sclerotic lesion. Sinuses/Orbits: The visualized paranasal sinuses and mastoid air cells are clear. Visualized portions of the globes and intraorbital fat are unremarkable. Other: None. CT CERVICAL SPINE FINDINGS Alignment: Normal. Skull base and vertebrae: No acute fracture. No primary bone lesion or focal pathologic process. Soft tissues and spinal canal: Soft tissue gas is noted in the right lower neck and supraclavicular region. Disc levels: Marked loss of disc height noted C6-7 into a lesser degree at C7-T1. Endplate spurring noted at both levels. There is diffuse bilateral facet osteoarthritis with fusion of the C2-3 facets on the left. Upper chest: Tiny right apical pneumothorax evident. Other: None. IMPRESSION: 1. No acute intracranial abnormality. 2. No evidence for cervical spine fracture or subluxation. 3. Soft tissue gas in the right lower neck and supraclavicular region is associated with a tiny anterior pneumothorax identified in the visualized portion of the right lung apex. Patient had a CT chest performed at the same time as this exam. Please see that report for full details. Electronically Signed   By: Misty Stanley M.D.   On: 12/11/2021 10:34   CT CHEST ABDOMEN PELVIS W CONTRAST  Result Date: 12/11/2021 CLINICAL DATA:  Chest and abdominal pain after fall  2 days ago EXAM: CT CHEST, ABDOMEN, AND PELVIS WITH CONTRAST TECHNIQUE: Multidetector CT imaging of the chest, abdomen and pelvis was performed following the standard protocol during bolus  administration of intravenous contrast. RADIATION DOSE REDUCTION: This exam was performed according to the departmental dose-optimization program which includes automated exposure control, adjustment of the mA and/or kV according to patient size and/or use of iterative reconstruction technique. CONTRAST:  51m OMNIPAQUE IOHEXOL 300 MG/ML  SOLN COMPARISON:  Radiograph of same day.  CT of May 09, 2014. FINDINGS: CT CHEST FINDINGS Cardiovascular: Atherosclerosis of thoracic aorta is noted without aneurysm or dissection. Normal cardiac size. No pericardial effusion. Coronary artery calcifications are noted. Mediastinum/Nodes: No enlarged mediastinal, hilar, or axillary lymph nodes. Thyroid gland, trachea, and esophagus demonstrate no significant findings. Lungs/Pleura: Small right anterior pneumothorax is noted on the order of 15-20%. No pleural effusion is noted. Bilateral lower lobe atelectasis is noted. Musculoskeletal: Mildly displaced right fifth, sixth and seventh rib fractures are noted. Extensive subcutaneous emphysema is seen in the right lateral chest wall. CT ABDOMEN PELVIS FINDINGS Hepatobiliary: No focal liver abnormality is seen. No gallstones, gallbladder wall thickening, or biliary dilatation. Pancreas: Unremarkable. No pancreatic ductal dilatation or surrounding inflammatory changes. Spleen: Normal in size without focal abnormality. Adrenals/Urinary Tract: Adrenal glands are unremarkable. Kidneys are normal, without renal calculi, focal lesion, or hydronephrosis. Bladder is unremarkable. Stomach/Bowel: Stomach is within normal limits. Appendix appears normal. No evidence of bowel wall thickening, distention, or inflammatory changes. Vascular/Lymphatic: Aortic atherosclerosis. No enlarged abdominal or pelvic lymph nodes. Reproductive: Mild prostatic enlargement is noted. Other: No abdominal wall hernia or abnormality. No abdominopelvic ascites. Musculoskeletal: Severe degenerative changes seen involving  the right hip joint. No acute osseous abnormality is noted. IMPRESSION: Small right anterior and basilar pneumothorax is noted on the order of 15-20%. This pneumothorax was described on radiograph of same day. Mildly displaced fractures are seen involving the lateral portions of the right fifth, sixth and seventh ribs. Extensive subcutaneous emphysema is seen involving the right chest wall. Bibasilar atelectasis is noted, right greater than left. No traumatic abnormality seen in the abdomen or pelvis. Mild prostatic enlargement is noted. Severe degenerative joint disease of right hip. Aortic Atherosclerosis (ICD10-I70.0). Electronically Signed   By: JMarijo ConceptionM.D.   On: 12/11/2021 10:44   DG Chest Portable 1 View  Result Date: 12/11/2021 CLINICAL DATA:  Chest tube insertion. EXAM: PORTABLE CHEST 1 VIEW COMPARISON:  CT chest 12/11/2021, AP chest 12/11/2021 chest two views 10/28/2021 FINDINGS: New small pigtail catheter with tip overlying the medial right hemithorax. Interval decrease and near resolution of the prior tiny right apical pneumothorax. Medial right lower lung greater than left lower lung linear subsegmental atelectasis. No definite pleural effusion. Cardiac silhouette again appears mildly enlarged. Mediastinal contours are grossly within normal limits. Lateral right sixth rib mildly displaced fracture is again noted. The additional right fifth and seventh rib fractures seen on prior CT are less well visualized. Right lateral chest wall subcutaneous air is again seen. IMPRESSION:: IMPRESSION: 1. Interval placement of right-sided chest tube. Interval decrease and near resolution of the prior tiny right apical pneumothorax. 2. Right lateral acute rib fractures are again noted on radiographs and prior CT. Electronically Signed   By: RYvonne KendallM.D.   On: 12/11/2021 13:12   DG Chest Port 1 View  Result Date: 12/11/2021 CLINICAL DATA:  Status post fall.  Right lower anterior rib pain. EXAM:  PORTABLE CHEST 1 VIEW COMPARISON:  10/28/2021 FINDINGS: Right infrahilar airspace  disease likely reflecting atelectasis versus scarring. Left basilar atelectasis. Small right apical pneumothorax measuring less than 10%. No left pneumothorax. No pleural effusion. Stable cardiomediastinal silhouette. Soft tissue emphysema along the right lateral chest wall a and right side of the neck. Right lateral sixth rib fracture partially visualized. IMPRESSION: 1. Small right apical pneumothorax measuring less than 10%. 2. Right lateral rib fracture partially visualized. Critical Value/emergent results were called by telephone at the time of interpretation on 12/11/2021 at 8:54 am to provider [facility] , who verbally acknowledged these results. Electronically Signed   By: Kathreen Devoid M.D.   On: 12/11/2021 08:55   DG FEMUR, MIN 2 VIEWS RIGHT  Result Date: 12/11/2021 CLINICAL DATA:  Right leg immobility after fall 2 days ago. EXAM: RIGHT FEMUR 2 VIEWS COMPARISON:  Right hip x-rays dated May 23, 2021. Right knee x-rays dated May 21, 2021. FINDINGS: No acute fracture or dislocation. Unchanged severe right hip and mild-to-moderate right knee osteoarthritis. IMPRESSION: 1. No acute osseous abnormality. 2. Unchanged severe right hip osteoarthritis. Electronically Signed   By: Titus Dubin M.D.   On: 12/11/2021 10:11     Positive ROS: All other systems have been reviewed and were otherwise negative with the exception of those mentioned in the HPI and as above.  Physical Exam: General: No acute distress Cardiovascular: No pedal edema Respiratory: No cyanosis, no use of accessory musculature GI: No organomegaly, abdomen is soft and non-tender Skin: No lesions in the area of chief complaint Neurologic: Sensation intact distally Psychiatric: Patient is at baseline mood and affect Lymphatic: No axillary or cervical lymphadenopathy  MUSCULOSKELETAL:  Tenderness which is mild with logroll of the right hip.  Leg  lengths are equal.  Sensation is intact about the right foot in all distributions toes are warm well perfused  Independent Imaging Review: X-ray right hip 3 views as well as CT scan pelvis: Advanced osteoarthritis of the right hip without discrete fracture  Assessment: 71 year old male with right hip severe osteoarthritis.  At this time would recommend mobilization and activity as tolerated.  I do not necessarily believe that an injection would provide him with any long-lasting relief as his arthritis is rather severe.  Plan for medical pain management and mobilization with PT as tolerated  Plan: May follow-up as an outpatient for further discussion of arthroplasty although he does have significant medical comorbidities that would likely preclude this  Thank you for the consult and the opportunity to see [name]  Vanetta Mulders, MD [facility] 7:38 AM

## 2021-12-12 NOTE — Progress Notes (Signed)
PT Cancellation Note  Patient Details Name: [name] MRN: [id number] DOB: 09-Jul-1951   Cancelled Treatment:    Reason Eval/Treat Not Completed: Pt states he is not moving despite max encouragement and education; pt with multiple complaints, including pain. Will follow-up for PT Evaluation if pt agreeable.  Mabeline Caras, PT, DPT Acute Rehabilitation Services  Pager [phone] Office [place] 12/12/2021, 10:32 AM

## 2021-12-12 NOTE — Progress Notes (Signed)
Rec'd call from lab [id number] lab value. INR 4.1. Paged to notify of lab.

## 2021-12-12 NOTE — Progress Notes (Signed)
This encounter was created in error - please disregard.

## 2021-12-12 NOTE — Progress Notes (Signed)
°  Transition of Care [facility]) Screening Note   Patient Details  Name: [name] Date of Birth: 07-09-1951   Transition of Care [facility]) CM/SW Contact:    Glennon Mac, RN Phone Number: 12/12/2021, 4:15 PM    Transition of Care Department [facility]) has reviewed patient and no TOC needs have been identified at this time. We will continue to monitor patient advancement through interdisciplinary progression rounds. If new patient transition needs arise, please place a TOC consult.  Quintella Baton, RN, BSN  Trauma/Neuro ICU Case Manager [phone]

## 2021-12-12 NOTE — Progress Notes (Signed)
OT Cancellation Note  Patient Details Name: [name] MRN: [id number] DOB: 1950/11/11   Cancelled Treatment:    Reason Eval/Treat Not Completed: Patient declined, no reason specified. Pt stating he is not moving despite maximum encouragement.   Malka So 12/12/2021, 9:13 AM Nestor Lewandowsky, OTR/L Acute Rehabilitation Services Pager: [phone] Office: [phone]

## 2021-12-12 NOTE — Progress Notes (Signed)
°   12/12/21 1139  Mobility  Activity Refused mobility

## 2021-12-12 NOTE — Plan of Care (Signed)

## 2021-12-12 NOTE — TOC CAGE-AID Note (Signed)
Transition of Care [facility]) - CAGE-AID Screening   Patient Details  Name: [name] MRN: [id number] Date of Birth: [birth year]  Transition of Care [facility]) CM/SW Contact:    Cecilee Rosner C Tarpley-Carter, LCSWA Phone Number: 12/12/2021, 8:56 AM   Clinical Narrative: Pt participated in Cage-Aid.  Pt stated he does not use substance or ETOH.  Pt was not offered resources, due to no usage of substance or ETOH.     Gatsby Chismar Tarpley-Carter, MSW, LCSW-A Pronouns:  She/Her/Hers [place] Transitions of Care Clinical Social Worker Direct Number:  [phone] [email or web address]  CAGE-AID Screening:    Have You Ever Felt You Ought to Cut Down on Your Drinking or Drug Use?: No Have People Annoyed You By [employer] Your Drinking Or Drug Use?: No Have You Felt Bad Or Guilty About Your Drinking Or Drug Use?: No Have You Ever Had a Drink or Used Drugs First Thing In The Morning to Steady Your Nerves or to Get Rid of a Hangover?: No CAGE-AID Score: 0  Substance Abuse Education Offered: No

## 2021-12-13 ENCOUNTER — Inpatient Hospital Stay (HOSPITAL_COMMUNITY): Payer: Medicare HMO

## 2021-12-13 LAB — BASIC METABOLIC PANEL
Anion gap: 10 (ref 5–15)
BUN: 29 mg/dL — ABNORMAL HIGH (ref 8–23)
CO2: 22 mmol/L (ref 22–32)
Calcium: 8.4 mg/dL — ABNORMAL LOW (ref 8.9–10.3)
Chloride: 104 mmol/L (ref 98–111)
Creatinine, Ser: 1.73 mg/dL — ABNORMAL HIGH (ref 0.61–1.24)
GFR, Estimated: 42 mL/min — ABNORMAL LOW (ref >60)
Glucose, Bld: 136 mg/dL — ABNORMAL HIGH (ref 70–99)
Potassium: 4.7 mmol/L (ref 3.5–5.1)
Sodium: 136 mmol/L (ref 135–145)

## 2021-12-13 LAB — GLUCOSE, CAPILLARY
Glucose-Capillary: 143 mg/dL — ABNORMAL HIGH (ref 70–99)
Glucose-Capillary: 215 mg/dL — ABNORMAL HIGH (ref 70–99)

## 2021-12-13 LAB — PROTIME-INR
INR: 2.7 — ABNORMAL HIGH (ref 0.8–1.2)
Prothrombin Time: 29 seconds — ABNORMAL HIGH (ref 11.4–15.2)

## 2021-12-13 NOTE — Progress Notes (Signed)
Pt had complete bath and linen change. While performing dressing change at chest tube site the tip of the chest tube on top of pts' skin. Paged trauma on call awaiting. Will continue to monitor and tx as indicated.

## 2021-12-13 NOTE — Progress Notes (Signed)
PT Cancellation Note  Patient Details Name: [name] MRN: [id number] DOB: 06-24-1951   Cancelled Treatment:    Reason Eval/Treat Not Completed: Patient not medically ready (CT was dislodged overnight; pt. not medically appropriate at this time.  Discussed with NSG.  PT to f/u as able once CT back in appropriate position.)  [name] A. [name], PT, DPT Acute Rehabilitation Services Office: [phone]  [name] A [name] 12/13/2021, 9:50 AM

## 2021-12-13 NOTE — Progress Notes (Signed)
OT Cancellation Note  Patient Details Name: ILIJAH DOUCET MRN: [id number] DOB: 09/03/1951   Cancelled Treatment:    Reason Eval/Treat Not Completed: Medical issues which prohibited therapy. Pt's chest tube fell out during the night. RN reaching out to MD for clarity with mobilizing pt. OT will hold for now and follow up as time allows.   Taksh Hjort H., OTR/L Acute Rehabilitation  Morgen Linebaugh Elane Bing Plume 12/13/2021, 3:07 PM

## 2021-12-13 NOTE — Progress Notes (Signed)
Progress Note     Subjective: Still having pain in his ribs. Denies SHOB. Asking to see therapies today. Not using his IS. Chest tube apparently was pulled out overnight   Objective: Vital signs in last 24 hours: Temp:  [97.7 F (36.5 C)-98.2 F (36.8 C)] 97.7 F (36.5 C) (02/04 0801) Pulse Rate:  [47-92] 47 (02/04 0801) Resp:  [16-18] 16 (02/04 0801) BP: (103-155)/(71-85) 125/71 (02/04 0801) SpO2:  [92 %-95 %] 95 % (02/04 0801) Weight:  [94.2 kg] 94.2 kg (02/04 0503)    Intake/Output from previous day: 02/03 0701 - 02/04 0700 In: 50 [I.V.:50] Out: 850 [Urine:850] Intake/Output this shift: No intake/output data recorded.  PE: General: pleasant, WD, male who is laying in bed in NAD HEENT: head is normocephalic, atraumatic.  Mouth is pink and moist Heart: rrr.  Palpable radial pulses bilaterally Lungs: CTAB, Respiratory effort nonlabored. CT in place on suction with SS output (60 ml total) in canister. Dressing c/d/I. No air leak, placed on WS Abd: soft, NT, ND MSK: all 4 extremities are symmetrical with no cyanosis, clubbing, or edema. Skin: warm and dry Psych: A&Ox3 with an appropriate affect.    Lab Results:  Recent Labs    12/11/21 0851 12/11/21 0901 12/12/21 0151  WBC 12.3*  --  8.5  HGB 13.8 15.6 12.1*  HCT 44.6 46.0 37.6*  PLT 186  --  157   BMET Recent Labs    12/12/21 0151 12/13/21 0343  NA 138 136  K 4.4 4.7  CL 104 104  CO2 26 22  GLUCOSE 127* 136*  BUN 36* 29*  CREATININE 1.86* 1.73*  CALCIUM 8.4* 8.4*   PT/INR Recent Labs    12/12/21 0151 12/13/21 0343  LABPROT 39.9* 29.0*  INR 4.1* 2.7*   CMP     Component Value Date/Time   NA 136 12/13/2021 0343   K 4.7 12/13/2021 0343   CL 104 12/13/2021 0343   CO2 22 12/13/2021 0343   GLUCOSE 136 (H) 12/13/2021 0343   BUN 29 (H) 12/13/2021 0343   CREATININE 1.73 (H) 12/13/2021 0343   CREATININE 1.18 06/13/2021 0941   CALCIUM 8.4 (L) 12/13/2021 0343   PROT 8.0 12/11/2021 0851    ALBUMIN 3.9 12/11/2021 0851   AST 30 12/11/2021 0851   ALT 42 12/11/2021 0851   ALKPHOS 32 (L) 12/11/2021 0851   BILITOT 1.0 12/11/2021 0851   GFRNONAA 42 (L) 12/13/2021 0343   GFRAA >60 08/06/2020 1418   Lipase  No results found for: LIPASE     Studies/Results: DG Chest Port 1 View  Result Date: 12/13/2021 CLINICAL DATA:  Chest tube EXAM: PORTABLE CHEST 1 VIEW COMPARISON:  01/09/2022. FINDINGS: The heart size and mediastinal contours are stable. The right-sided chest tube is no longer seen. There is a small residual pneumothorax along the lateral lung border and air is also present in the minor fissure. Lung volumes are low and atelectasis is noted at the lung bases bilaterally. No pleural effusion. Stable rib fractures are noted on the right. IMPRESSION: 1. Right chest tube is no longer visualized on exam and there is a small residual pneumothorax. 2. Low lung volumes with atelectasis at the lung bases. 3. Stable rib fractures on the right. Electronically Signed   By: Thornell Sartorius M.D.   On: 12/13/2021 04:14   DG Chest Port 1 View  Result Date: 12/12/2021 CLINICAL DATA:  Encounter for right pneumothorax. EXAM: PORTABLE CHEST 1 VIEW COMPARISON:  February 2, 23. FINDINGS: Right chest  tube in place. No visible pneumothorax on this semi erect radiograph. Subcutaneous emphysema along the right chest wall. Low lung volumes. Similar bibasilar opacities. No definite pleural effusion. Similar mild enlargement the cardiac silhouette. Redemonstrated right rib fractures, better characterized on prior CT. IMPRESSION: 1. Right chest tube in place without visible pneumothorax. 2. Similar low lung volumes and bibasilar opacities, most likely atelectasis. Electronically Signed   By: Feliberto Harts M.D.   On: 12/12/2021 08:10   DG Chest Portable 1 View  Result Date: 12/11/2021 CLINICAL DATA:  Chest tube insertion. EXAM: PORTABLE CHEST 1 VIEW COMPARISON:  CT chest 12/11/2021, AP chest 12/11/2021 chest two  views 10/28/2021 FINDINGS: New small pigtail catheter with tip overlying the medial right hemithorax. Interval decrease and near resolution of the prior tiny right apical pneumothorax. Medial right lower lung greater than left lower lung linear subsegmental atelectasis. No definite pleural effusion. Cardiac silhouette again appears mildly enlarged. Mediastinal contours are grossly within normal limits. Lateral right sixth rib mildly displaced fracture is again noted. The additional right fifth and seventh rib fractures seen on prior CT are less well visualized. Right lateral chest wall subcutaneous air is again seen. IMPRESSION:: IMPRESSION: 1. Interval placement of right-sided chest tube. Interval decrease and near resolution of the prior tiny right apical pneumothorax. 2. Right lateral acute rib fractures are again noted on radiographs and prior CT. Electronically Signed   By: Neita Garnet M.D.   On: 12/11/2021 13:12    Anti-infectives: Anti-infectives (From admission, onward)    None        Assessment/Plan  Fall down 3 steps R 5-7 rib fxs with PTX - CT placed in ED 2/2. CT pulled out inadvertently apparently. The stitch holding tubing to skin was removed today but tube was out of his body without any dressing R hip OA - read as fracture on pelvic xray but femur xray and CT abd/pel negative. ortho consulted and note right hip OA which could benefit from THA. F/u Dr. Steward Drone. WBAT RLE HTN - home meds CAD A fib - on amiodarone baseline, hold coumadin, INR 3.4 on admit, now 4.1 T2DM - resume home meds CKD 3 - creatinine improving, diet as tolerated   FEN: carb mod, IVF - dec to 50 ml/hr ID: none currently VTE: hold coumadin, SCDs   I reviewed Consultant orthopedic notes, last 24 h vitals and pain scores, last 48 h intake and output, last 24 h labs and trends, and last 24 h imaging results.  This care required moderate level of medical decision making.    LOS: 2 days   Andria Meuse, MD [facility] 12/13/2021, 11:02 AM Please see Amion for pager number during day hours 7:00am-4:30pm

## 2021-12-13 NOTE — Evaluation (Signed)
Physical Therapy Evaluation Patient Details Name: [name] MRN: [id number] DOB: 02-Nov-1951 Today's Date: 12/13/2021  History of Present Illness  Pt is a 71 y.o. male admitted 12/11/21 after fall off porch on 1/31 resulting in R hip, back and chest pain. Initial workup showed R femoral neck fx, but CT scan ruled this out; pt with advanced R hip OA. Pt also with R rib fxs and PTX; s/p chest tube placement in ED. PMH includes CHF, DM, CKD, HTN, OSA, PVD (s/p L toe amputation), afib, ADHD, chronic R hip pain.  Clinical Impression  Unsure how accurate information gathered from patient is re: PLOF as he states he was previously mod I, but later reports he would slide down to floor and then pull back up off of floor to transfer.  After most recent fall, pt is currently requiring mod A for bed mobility and min A for transfers.  Unable to amb at this time not due to pain, but due to pt's inability to follow simple instructions.  Pt is often argumentative in regards to PT instruction/education and rambles about lever arms, physics and anatomy through duration of eval.  If information provided by patient is accurate, he should be able to transfer home upon D/C with assist from family.  If family is unable to assist or provided info is inaccurate, will need to consider rehab.     Recommendations for follow up therapy are one component of a multi-disciplinary discharge planning process, led by the attending physician.  Recommendations may be updated based on patient status, additional functional criteria and insurance authorization.  Follow Up Recommendations Home health PT    Assistance Recommended at Discharge Intermittent Supervision/Assistance  Patient can return home with the following  A little help with walking and/or transfers;A little help with bathing/dressing/bathroom;Assistance with cooking/housework;Assist for transportation;Help with stairs or ramp for entrance    Equipment  Recommendations  (Need further gait assessment to determine safety with use of RW)  Recommendations for Other Services       Functional Status Assessment Patient has had a recent decline in their functional status and demonstrates the ability to make significant improvements in function in a reasonable and predictable amount of time.     Precautions / Restrictions Precautions Precautions: Fall      Mobility  Bed Mobility Overal bed mobility: Needs Assistance Bed Mobility: Supine to Sit     Supine to sit: Mod assist     General bed mobility comments: Takes several attempts to get pt up to sitting EOB.  Every time PT attempts to cue pt to assist, pt begins talking nonstop about level arms and force, anatomy and physiology and so on.  PT finally provides TC/assist to partial roll to L side and swing LEs off of EOB to assist with upright sitting.  Pt. demos poor sitting balance initially with L sided lean, but eventuall is able to correct.  Pt maintains R LE extended out stating he cannot bend it, but later when going to stand pt does bend LE in preparation for standing.  Pt. states "I will just slide myself onto floor and then stand back up to get in the chair".  When PT explains we will not be sliding down to the floor pt states "well that's how I did it at home". Patient Response: Cooperative, Impulsive  Transfers Overall transfer level: Needs assistance Equipment used: Rolling walker (2 wheels) Transfers: Sit to/from Stand, Bed to chair/wheelchair/BSC Sit to Stand: Min assist   Step  pivot transfers: Min guard       General transfer comment: Pt is noted to pivot pelvis away from chair so he is facing chair and sideways in relation to RW.  PT cues pt not to scoot too far to avoid sliding off EOB and that pelvis should really go in direction of the seat we are transferring to.  Pt states that he has to turn side ways to leverage his weight on his L LE.  Pt able to stand with CGA but  demos poor safety with hand placement during transition.  Once in standing, does not hold onto RW and instead slides it out of the way.  Able to take steps over towards chair with CGA but does not ensure he is fully back to chair prior to sitting down.  PT positions pillows to help pt sit upright in chair.    Ambulation/Gait                  Stairs            Wheelchair Mobility    Modified Rankin (Stroke Patients Only)       Balance Overall balance assessment: Needs assistance Sitting-balance support: Bilateral upper extremity supported, Feet supported Sitting balance-Leahy Scale: Fair       Standing balance-Leahy Scale: Fair                               Pertinent Vitals/Pain Pain Assessment Pain Assessment: Faces Pain Score: 5  Pain Location: R ribs Pain Descriptors / Indicators: Aching, Tender, Discomfort Pain Intervention(s): Limited activity within patient's tolerance, Monitored during session, Repositioned    Home Living Family/patient expects to be discharged to:: Private residence Living Arrangements: Children;Other relatives (Reports living with daughter/granddaughter) Available Help at Discharge: Family Type of Home: House           Home Equipment: Conservation officer, nature (2 wheels)      Prior Function Prior Level of Function : Independent/Modified Independent             Mobility Comments: Pt reports being previously mod I with use of RW.  States familys helps with cooking/cleaning as needed.       Hand Dominance        Extremity/Trunk Assessment   Upper Extremity Assessment Upper Extremity Assessment: Defer to OT evaluation    Lower Extremity Assessment Lower Extremity Assessment: RLE deficits/detail RLE Deficits / Details: Grossly 2+/5 RLE: Unable to fully assess due to pain       Communication   Communication: No difficulties  Cognition Arousal/Alertness: Awake/alert Behavior During Therapy: WFL for tasks  assessed/performed Overall Cognitive Status: Difficult to assess                                 General Comments: NT finds PT and states that CT has been removed and he is being trialed for 24 hrs without and that MD wants pt up.  Pt is found half hanging out of bed without gown eyes.  Very talkative and hard to keep on task.        General Comments      Exercises     Assessment/Plan    PT Assessment Patient needs continued PT services  PT Problem List Decreased strength;Decreased mobility;Decreased safety awareness;Decreased range of motion;Decreased activity tolerance;Decreased balance;Pain;Decreased knowledge of use of DME  PT Treatment Interventions DME instruction;Therapeutic exercise;Gait training;Balance training;Functional mobility training;Therapeutic activities;Patient/family education    PT Goals (Current goals can be found in the Care Plan section)  Acute Rehab PT Goals Patient Stated Goal: Pt's goal is to return to walking PT Goal Formulation: With patient Time For Goal Achievement: 12/27/21 Potential to Achieve Goals: Good    Frequency Min 3X/week     Co-evaluation               AM-PAC PT "6 Clicks" Mobility  Outcome Measure Help needed turning from your back to your side while in a flat bed without using bedrails?: A Lot Help needed moving from lying on your back to sitting on the side of a flat bed without using bedrails?: A Lot Help needed moving to and from a bed to a chair (including a wheelchair)?: A Little Help needed standing up from a chair using your arms (e.g., wheelchair or bedside chair)?: A Little Help needed to walk in hospital room?: A Little Help needed climbing 3-5 steps with a railing? : A Lot 6 Click Score: 15    End of Session Equipment Utilized During Treatment: Gait belt Activity Tolerance: Patient tolerated treatment well Patient left: in chair;with call bell/phone within reach;with chair alarm set Nurse  Communication: Mobility status PT Visit Diagnosis: Other abnormalities of gait and mobility (R26.89);History of falling (Z91.81);Pain Pain - Right/Left: Right Pain - part of body: Hip (Ribs)    Time: [id number] PT Time Calculation (min) (ACUTE ONLY): 23 min   Charges:   PT Evaluation $PT Eval Low Complexity: 1 Low PT Treatments $Therapeutic Activity: 8-22 mins        Khalon Cansler A. Logun Colavito, PT, DPT Acute Rehabilitation Services Office: [place] 12/13/2021, 12:41 PM

## 2021-12-14 ENCOUNTER — Inpatient Hospital Stay (HOSPITAL_COMMUNITY): Payer: Medicare HMO

## 2021-12-14 DIAGNOSIS — I4892 Unspecified atrial flutter: Secondary | ICD-10-CM

## 2021-12-14 DIAGNOSIS — I480 Paroxysmal atrial fibrillation: Secondary | ICD-10-CM

## 2021-12-14 LAB — GLUCOSE, CAPILLARY
Glucose-Capillary: 126 mg/dL — ABNORMAL HIGH (ref 70–99)
Glucose-Capillary: 186 mg/dL — ABNORMAL HIGH (ref 70–99)
Glucose-Capillary: 177 mg/dL — ABNORMAL HIGH (ref 70–99)
Glucose-Capillary: 134 mg/dL — ABNORMAL HIGH (ref 70–99)
Glucose-Capillary: 138 mg/dL — ABNORMAL HIGH (ref 70–99)

## 2021-12-14 LAB — PROTIME-INR
INR: 1.9 — ABNORMAL HIGH (ref 0.8–1.2)
Prothrombin Time: 22.1 seconds — ABNORMAL HIGH (ref 11.4–15.2)

## 2021-12-14 NOTE — Progress Notes (Signed)
Mobility Specialist Criteria Algorithm Info.    12/14/21 1440  Pain Assessment  Pain Assessment 0-10  Pain Score 7  Faces Pain Scale 6  Pain Location R ribs  Pain Descriptors / Indicators Discomfort;Grimacing;Moaning  Pain Intervention(s) Monitored during session  Mobility  Bed Position Semi-fowlers  Activity Ambulated with assistance in room  Range of Motion/Exercises Active;All extremities  Level of Assistance Contact guard assist, steadying assist  Assistive Device Front wheel walker  Distance Ambulated (ft) 100 ft  Activity Response Tolerated well   Patient received in bed agreeable to participate with max encouragement. Pt distracted easily and rambles about miscellaneous scientific facts regarding anatomy/physiology, botany, etc. Required max cues to get EOB + min HHA to assist trunk from supine>sit. Regardless of cues for safe hand/foot placement pt stood impulsively using his own technique stating it was easiest for him. Pt slid bottom to farthest point EOB then stood by grabbing bed rail with both arms. Was able to ambulate forwards and backwards multiple times from bed to couch. Requires verbal directional cues to avoid furniture due to having poor eye sight. Returned to bed without incident or complaint. Requires mod assist to return from sit>supine, more so assisting LE's in bed. Was left with all needs met, call bell in reach and bed alarm set.   12/14/2021 4:46 PM  [place] Gabryella Murfin, [place], [place]  PZPSU:[phone] Office: [phone]

## 2021-12-14 NOTE — Consult Note (Signed)
Cardiology Consultation:   Patient ID: [name] MRN: [id number]; DOB: [birth year]  Admit date: 12/11/2021 Date of Consult: 12/14/2021  PCP:  Sandrea Hughs, NP   [facility] Providers Cardiologist:  None  Advanced Heart Failure:  Glori Bickers, MD       Patient Profile:   [name] is a 71 y.o. male with a hx of chronic systolic heart failure, hypertension, suspected ischemic cardiomyopathy, PAF, history of LV thrombus, chronic Coumadin therapy, CKD 3, DM2 on insulin, OSA, and PVD s/p toe amputation who is being seen 12/14/2021 for the evaluation of need for anticoagulation at the request of Dr. Dema Severin.  History of Present Illness:   [name] follows in the advanced heart failure clinic.  He has a history of PAF, severe depression, DM 2, CKD stage III, obesity, OSA, and chronic systolic heart failure.  A. fib RVR diagnosed in 2015 during a hospitalization.  He was again hospitalized in 2017 with volume overload and A. fib RVR.  He was diuresed and planned for a TEE guided cardioversion.  However TEE showed LAA thrombus.  He was started on Coumadin.  He was admitted again in 2017 with A. fib RVR and CHF in the setting of medication noncompliance.  Ischemia was suspected but he has refused heart catheterization.  LVEF was 20% on Myoview along with suspected ischemia.  Psychiatry at that time deemed him able to make decisions on his own.  LVEF on echo 03/2016 was 15 to 20%.  Fortunately, his EF normalized in 2021.  He was admitted in December 2022 with CHF.  Medications delivered in bubble packs apparently were not correct: Metoprolol was once a day instead of twice daily and his diuretic regimen was lower than recommended.  Echo at that time with LVEF 45 to 50%.  He was again in A. fib and underwent TEE guided cardioversion with successful conversion to normal sinus rhythm.  Beta-blocker was held due to bradycardia and he was continued on 200 mg amiodarone.  Unfortunately he  was back in A. fib at discharge.  He was last seen in the advanced heart failure clinic on 11/14/2021.  He is maintained on [place], Jardiance, and spironolactone.  No beta-blocker due to bradycardia.  He presented to [facility] ED 12/11/2021 following a mechanical fall 2 days prior.  He was getting up from a sitting position on his front porch and fell from standing.  He fell down 3 steps.  He was unable to get up after his fall.  He presented with right chest lower back and right hip pain.  CXR notable for rib fractures and pneumothorax treated with chest tube placement.  Pneumothorax resolved and chest tube has been discontinued.  Coumadin was held, INR on admission was 3.4 and rose to 4.1.  Of note he is on amiodarone.  INR has drifted down to 1.9 and cardiology was consulted for recommendations on continuation of Coumadin.  During my interview, he confirms details consistent with a mechanical fall.     Past Medical History:  Diagnosis Date   A-fib [facility])    a. (06/04/14) TEE-DC-CV; succesful; large LA 6.2 cm   ADHD    Ascites    Athlete's foot    Chronic systolic heart failure ([place])    a. 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, unspecified CKD stage, unspecified whether long term insulin use (HCC)    Heart murmur    HTN (hypertension)    OSA (obstructive sleep apnea)    PVD (peripheral vascular disease) ([place])    s/p great toe amputation    Past Surgical History:  Procedure Laterality Date   ABDOMINAL AORTOGRAM W/LOWER EXTREMITY N/A 05/19/2021   Procedure: ABDOMINAL AORTOGRAM W/LOWER EXTREMITY;  Surgeon: Marty Heck, MD;  Location: [place] [facility];  Service: Cardiovascular;  Laterality: N/A;   AMPUTATION Left 05/22/2021   Procedure: LEFT FIFTH TOE AMPUTATION;  Surgeon: Marty Heck, MD;  Location: [facility];  Service: Vascular;  Laterality: Left;   CARDIOVERSION N/A 06/04/2014   Procedure: CARDIOVERSION;  Surgeon: Larey Dresser, MD;  Location: [facility] ENDOSCOPY;  Service: Cardiovascular;  Laterality: N/A;   CARDIOVERSION N/A 11/06/2021   Procedure: CARDIOVERSION;  Surgeon: Larey Dresser, MD;  Location: [facility] ENDOSCOPY;  Service: Cardiovascular;  Laterality: N/A;   NOSE SURGERY     Nasal septum surgery   PERIPHERAL VASCULAR INTERVENTION Left 05/19/2021   Procedure: PERIPHERAL VASCULAR INTERVENTION;  Surgeon: Marty Heck, MD;  Location: [place] [facility];  Service: Cardiovascular;  Laterality: Left;   TEE WITHOUT CARDIOVERSION N/A 06/04/2014   Procedure: TRANSESOPHAGEAL ECHOCARDIOGRAM (TEE);  Surgeon: Larey Dresser, MD;  Location: [place];  Service: Cardiovascular;  Laterality: N/A;   TEE WITHOUT CARDIOVERSION N/A 01/06/2016   Procedure: TRANSESOPHAGEAL ECHOCARDIOGRAM (TEE);  Surgeon: Fay Records, MD;  Location: [place];  Service: Cardiovascular;  Laterality: N/A;   TEE WITHOUT CARDIOVERSION N/A 11/06/2021   Procedure: TRANSESOPHAGEAL ECHOCARDIOGRAM (TEE);  Surgeon: Larey Dresser, MD;  Location: [facility] ENDOSCOPY;  Service: Cardiovascular;  Laterality: N/A;   VASECTOMY       Home Medications:  Prior to Admission medications   Medication Sig Start Date End Date Taking? Authorizing Provider  amiodarone (PACERONE) 200 MG tablet Take 1 tablet (200 mg total) by mouth daily. 12/09/21 01/08/22 Yes Ngetich, Dinah C, NP  Cyanocobalamin (VITAMIN B-12 PO) Take 1 capsule by mouth daily.   Yes [provider]  diclofenac Sodium (VOLTAREN) 1 % GEL Apply 2 g topically 4 (four) times daily. Patient taking differently: Apply 2 g topically 4 (four) times daily as needed (arthritis pain). 06/26/21  Yes Ngetich, Dinah C, NP  empagliflozin (JARDIANCE) 10 MG TABS tablet Take 1 tablet (10 mg total) by mouth daily. 12/09/21 01/08/22 Yes Ngetich, Dinah C, NP   furosemide (LASIX) 20 MG tablet Take 1 tablet (20 mg total) by mouth 2 (two) times daily. May take an additional 56m daily as needed. 10/21/21  Yes Milford, JMorris FNP  Green Tea, Camellia sinensis, (GREEN TEA EXTRACT PO) Take 1 Dose by mouth daily.   Yes [provider]  insulin glargine (LANTUS) 100 UNIT/ML injection Inject 0.08 mLs (8 Units total) into the skin daily. 06/26/21 11/14/22 Yes Ngetich, Dinah C, NP  KRILL OIL OMEGA-3 PO Take 1 tablet by mouth daily.   Yes [provider]  polyethylene glycol powder (GLYCOLAX/MIRALAX) 17 GM/SCOOP powder Take 17 g by mouth daily. Hold for loose stool Patient taking differently: Take 17 g by mouth daily as needed for mild constipation. Hold for loose stool 06/26/21  Yes Ngetich, Dinah C, NP  potassium chloride (KLOR-CON) 10 MEQ tablet TAKE 2 TABLETS(20 MEQ) BY MOUTH TWICE DAILY Patient taking differently: 20 mEq 2 (two) times daily. 12/10/21  Yes Ngetich, Dinah C, NP  sacubitril-valsartan (ENTRESTO) 49-51 MG  Take 1 tablet by mouth 2 (two) times daily. 12/09/21 01/08/22 Yes Ngetich, Dinah C, NP  Sodium Hyaluronate, oral, (HYALURONIC ACID PO) Take 1 tablet by mouth daily. Vit c, 120 mg ,magnesium,22m,   Yes [provider]  spironolactone (ALDACTONE) 25 MG tablet Take 0.5 tablet (12.5 mg total) by mouth daily. 11/20/21 12/20/21 Yes Clegg, Amy D, NP  Vitamin D, Ergocalciferol, (DRISDOL) 1.25 MG (50000 UNIT) CAPS capsule TAKE 1 CAPSULE BY MOUTH EVERY THURSDAY 09/09/21  Yes Ngetich, Dinah C, NP  warfarin (COUMADIN) 6 MG tablet Take 1 tablet (6 mg total) by mouth daily. Take 2-1 tablets daily. Patient taking differently: Take 3-6 mg by mouth See admin instructions. Take 381mon Tuesdays,  Thursday and Saturdays, then 8m59mn  all other days. 07/24/21  Yes Camnitz, Will MarHassell DoneD  atorvastatin (LIPITOR) 10 MG tablet TAKE 1 TABLET(10 MG) BY MOUTH DAILY Patient not taking: Reported on 12/11/2021 12/11/21   CaiWaynetta SandyD  blood  glucose meter kit and supplies Dispense based on patient and insurance preference. Use up to four times daily as directed. (FOR ICD-10 E10.9, E11.9). 10/01/21   GreGerlene FeeP    Inpatient Medications: Scheduled Meds:  acetaminophen  1,000 mg Oral Q6H   amiodarone  200 mg Oral Daily   empagliflozin  10 mg Oral Daily   furosemide  20 mg Oral BID   insulin glargine-yfgn  8 Units Subcutaneous Daily   methocarbamol  500 mg Oral TID   polyethylene glycol  17 g Oral Daily   potassium chloride  20 mEq Oral BID   sacubitril-valsartan  1 tablet Oral BID   spironolactone  12.5 mg Oral Daily   Vitamin D (Ergocalciferol)  50,000 Units Oral Q7 days   Continuous Infusions:  PRN Meds: morphine injection, ondansetron **OR** ondansetron (ZOFRAN) IV, traMADol  Allergies:   No Known Allergies  Social History:   Social History   Socioeconomic History   Marital status: Single    Spouse name: Not on file   Number of children: Not on file   Years of education: Not on file   Highest education level: Not on file  Occupational History   Occupation: retired  Tobacco Use   Smoking status: Some Days    Years: 52.00    Types: Cigarettes, Cigars    Last attempt to quit: 05/2021    Years since quitting: 0.6   Smokeless tobacco: Never  Substance and Sexual Activity   Alcohol use: Not Currently   Drug use: Never   Sexual activity: Not on file  Other Topics Concern   Not on file  Social History Narrative   ** Merged History Encounter ** [place]. Retired from [employer]     Tobacco use, amount per day now:   Past tobacco use, amount per day:   How many years did you use tobacco:   Alcohol use (drinks per week): N/A   Diet:   Do you drink/eat things with caffeine:   Marital status:  Divorced                                What year were you married? 2003   Do you live in a house, apartment, assisted living, condo, trailer, etc.?  House   Is it one or more stories? One   How many persons live in your home?  Do you have pets in your home?( please list) N/A   Highest Level of education completed? Bachelors Degree   Current or past profession: Teacher-Special Ed   Do you exercise?  Yes                                Type and how often? Daily squats, and push ups.   Do you have a living will? No   Do you have a DNR form?  No                                 If not, do you want to discuss one?   Do you have signed POA/HPOA forms?  No                      If so, please bring to you appointment      Do you have any difficulty bathing or dressing yourself? Bathing only if pants not shirts/not shorts.   Do you have any difficulty preparing food or eating? No   Do you have any difficulty managing your medications? No   Do you have any difficulty managing your finances? No   Do you have any difficulty affording your medications? No   Social Determinants of Radio broadcast assistant Strain: Not on file  Food Insecurity: No Food Insecurity   Worried About Charity fundraiser in the Last Year: Never true   Ran Out of Food in the Last Year: Never true  Transportation Needs: No Transportation Needs   Lack of Transportation (Medical): No   Lack of Transportation (Non-Medical): No  Physical Activity: Not on file  Stress: Not on file  Social Connections: Not on file  Intimate Partner Violence: Not on file    Family History:    Family History  Problem Relation Age of Onset   Heart attack Mother        deceased   Diabetes Mother    Heart attack Sister      ROS:  Please see the history of present illness.   All other ROS reviewed and negative.     Physical Exam/Data:   Vitals:   12/13/21 1629 12/13/21 2056 12/14/21 0718 12/14/21 0900  BP: 135/69 133/67  130/70  Pulse: 84 87  85  Resp: _0 Temp: 98 F (36.7 C) 97.7 F (36.5 C)  98 F (36.7 C)  TempSrc: Oral Oral  Oral  SpO2: 94% 95% 95% 95%   Weight:      Height:        Intake/Output Summary (Last 24 hours) at 12/14/2021 1241 Last data filed at 12/14/2021 1200 Gross per 24 hour  Intake 360 ml  Output 1250 ml  Net -890 ml   Last 3 Weights 12/13/2021 12/11/2021 11/14/2021  Weight (lbs) 207 lb 10.8 oz 208 lb 1.8 oz 210 lb 6.4 oz  Weight (kg) 94.2 kg 94.4 kg 95.437 kg     Body mass index is 31.58 kg/m.  General:  Well nourished, well developed, in no acute distress HEENT: normal Neck: no JVD Vascular: No carotid bruits; Distal pulses 2+ bilaterally Cardiac:  normal S1, S2; RRR; no murmur  Lungs:  clear to auscultation bilaterally, no wheezing, rhonchi or rales  Abd: soft, nontender, no hepatomegaly  Ext: no edema Musculoskeletal:  left thumb amputation, L  toe amputation Skin: warm and dry  Neuro:  CNs 2-12 intact, no focal abnormalities noted Psych:  Normal affect   EKG:  The EKG was personally reviewed and demonstrates:  appears sinus rhythm with PACs   Telemetry:  Telemetry was personally reviewed and demonstrates:  sinus , PACs  Relevant CV Studies:  Echo 10/29/21:  1. Left ventricular ejection fraction, by estimation, is 45 to 50%. The  left ventricle has mildly decreased function. The left ventricle  demonstrates regional wall motion abnormalities (see scoring  diagram/findings for description). Left ventricular  diastolic parameters are consistent with Grade II diastolic dysfunction  (pseudonormalization). Elevated left atrial pressure. There is mild global  left ventricular hypokinesis, slightly worse in the basal and mid segments  of the inferior and  inferolateral walls.   2. Right ventricular systolic function is moderately reduced. The right  ventricular size is moderately enlarged. There is severely elevated  pulmonary artery systolic pressure. The estimated right ventricular  systolic pressure is 73.4 mmHg.   3. Left atrial size was moderately dilated.   4. Right atrial size was moderately dilated.    5. The mitral valve is normal in structure. No evidence of mitral valve  regurgitation.   6. Tricuspid valve regurgitation is mild to moderate.   7. The aortic valve is tricuspid. Aortic valve regurgitation is not  visualized. No aortic stenosis is present.   8. The inferior vena cava is dilated in size with <50% respiratory  variability, suggesting right atrial pressure of 15 mmHg.  Laboratory Data:  High Sensitivity Troponin:  No results for input(s): TROPONINIHS in the last 720 hours.   Chemistry Recent Labs  Lab 12/11/21 0851 12/11/21 0901 12/12/21 0151 12/13/21 0343  NA 136 138 138 136  K 4.7 4.6 4.4 4.7  CL 102 103 104 104  CO2 22  --  26 22  GLUCOSE 169* 165* 127* 136*  BUN 39* 39* 36* 29*  CREATININE 2.02* 2.00* 1.86* 1.73*  CALCIUM 9.1  --  8.4* 8.4*  GFRNONAA 35*  --  38* 42*  ANIONGAP 12  --  8 10    Recent Labs  Lab 12/11/21 0851  PROT 8.0  ALBUMIN 3.9  AST 30  ALT 42  ALKPHOS 32*  BILITOT 1.0   Lipids No results for input(s): CHOL, TRIG, HDL, LABVLDL, LDLCALC, CHOLHDL in the last 168 hours.  Hematology Recent Labs  Lab 12/11/21 0851 12/11/21 0901 12/12/21 0151  WBC 12.3*  --  8.5  RBC 5.00  --  4.24  HGB 13.8 15.6 12.1*  HCT 44.6 46.0 37.6*  MCV 89.2  --  88.7  MCH 27.6  --  28.5  MCHC 30.9  --  32.2  RDW 16.6*  --  16.5*  PLT 186  --  157   Thyroid No results for input(s): TSH, FREET4 in the last 168 hours.  BNPNo results for input(s): BNP, PROBNP in the last 168 hours.  DDimer No results for input(s): DDIMER in the last 168 hours.   Radiology/Studies:  DG Pelvis 1-2 Views  Result Date: 12/11/2021 CLINICAL DATA:  Trauma right back pain after fall 2 days ago. EXAM: PELVIS - 1-2 VIEW COMPARISON:  None. FINDINGS: Irregularity of the femoral neck with superior displacement of the femur concerning for subcapital femoral neck fracture. Diffuse osteopenia. Advanced right hip osteoarthritis. Multilevel degenerative disc disease of the lumbar spine.  IMPRESSION: Right subcapital femoral neck fracture. Electronically Signed   By: Keane Police D.O.   On: 12/11/2021  10:17   CT HEAD WO CONTRAST (5MM)  Result Date: 12/11/2021 CLINICAL DATA:  Patient fell 2 days ago.  Pain all over. EXAM: CT HEAD WITHOUT CONTRAST CT CERVICAL SPINE WITHOUT CONTRAST TECHNIQUE: Multidetector CT imaging of the head and cervical spine was performed following the standard protocol without intravenous contrast. Multiplanar CT image reconstructions of the cervical spine were also generated. RADIATION DOSE REDUCTION: This exam was performed according to the departmental dose-optimization program which includes automated exposure control, adjustment of the mA and/or kV according to patient size and/or use of iterative reconstruction technique. COMPARISON:  No comparison studies available. FINDINGS: CT HEAD FINDINGS Brain: There is no evidence for acute hemorrhage, hydrocephalus, mass lesion, or abnormal extra-axial fluid collection. No definite CT evidence for acute infarction. Tiny lacunar infarct noted right cerebellum Vascular: No hyperdense vessel or unexpected calcification. Skull: No evidence for fracture. No worrisome lytic or sclerotic lesion. Sinuses/Orbits: The visualized paranasal sinuses and mastoid air cells are clear. Visualized portions of the globes and intraorbital fat are unremarkable. Other: None. CT CERVICAL SPINE FINDINGS Alignment: Normal. Skull base and vertebrae: No acute fracture. No primary bone lesion or focal pathologic process. Soft tissues and spinal canal: Soft tissue gas is noted in the right lower neck and supraclavicular region. Disc levels: Marked loss of disc height noted C6-7 into a lesser degree at C7-T1. Endplate spurring noted at both levels. There is diffuse bilateral facet osteoarthritis with fusion of the C2-3 facets on the left. Upper chest: Tiny right apical pneumothorax evident. Other: None. IMPRESSION: 1. No acute intracranial abnormality. 2. No  evidence for cervical spine fracture or subluxation. 3. Soft tissue gas in the right lower neck and supraclavicular region is associated with a tiny anterior pneumothorax identified in the visualized portion of the right lung apex. Patient had a CT chest performed at the same time as this exam. Please see that report for full details. Electronically Signed   By: Misty Stanley M.D.   On: 12/11/2021 10:34   CT Cervical Spine Wo Contrast  Result Date: 12/11/2021 CLINICAL DATA:  Patient fell 2 days ago.  Pain all over. EXAM: CT HEAD WITHOUT CONTRAST CT CERVICAL SPINE WITHOUT CONTRAST TECHNIQUE: Multidetector CT imaging of the head and cervical spine was performed following the standard protocol without intravenous contrast. Multiplanar CT image reconstructions of the cervical spine were also generated. RADIATION DOSE REDUCTION: This exam was performed according to the departmental dose-optimization program which includes automated exposure control, adjustment of the mA and/or kV according to patient size and/or use of iterative reconstruction technique. COMPARISON:  No comparison studies available. FINDINGS: CT HEAD FINDINGS Brain: There is no evidence for acute hemorrhage, hydrocephalus, mass lesion, or abnormal extra-axial fluid collection. No definite CT evidence for acute infarction. Tiny lacunar infarct noted right cerebellum Vascular: No hyperdense vessel or unexpected calcification. Skull: No evidence for fracture. No worrisome lytic or sclerotic lesion. Sinuses/Orbits: The visualized paranasal sinuses and mastoid air cells are clear. Visualized portions of the globes and intraorbital fat are unremarkable. Other: None. CT CERVICAL SPINE FINDINGS Alignment: Normal. Skull base and vertebrae: No acute fracture. No primary bone lesion or focal pathologic process. Soft tissues and spinal canal: Soft tissue gas is noted in the right lower neck and supraclavicular region. Disc levels: Marked loss of disc height noted  C6-7 into a lesser degree at C7-T1. Endplate spurring noted at both levels. There is diffuse bilateral facet osteoarthritis with fusion of the C2-3 facets on the left. Upper chest: Tiny right apical pneumothorax  evident. Other: None. IMPRESSION: 1. No acute intracranial abnormality. 2. No evidence for cervical spine fracture or subluxation. 3. Soft tissue gas in the right lower neck and supraclavicular region is associated with a tiny anterior pneumothorax identified in the visualized portion of the right lung apex. Patient had a CT chest performed at the same time as this exam. Please see that report for full details. Electronically Signed   By: Misty Stanley M.D.   On: 12/11/2021 10:34   CT CHEST ABDOMEN PELVIS W CONTRAST  Result Date: 12/11/2021 CLINICAL DATA:  Chest and abdominal pain after fall 2 days ago EXAM: CT CHEST, ABDOMEN, AND PELVIS WITH CONTRAST TECHNIQUE: Multidetector CT imaging of the chest, abdomen and pelvis was performed following the standard protocol during bolus administration of intravenous contrast. RADIATION DOSE REDUCTION: This exam was performed according to the departmental dose-optimization program which includes automated exposure control, adjustment of the mA and/or kV according to patient size and/or use of iterative reconstruction technique. CONTRAST:  63m OMNIPAQUE IOHEXOL 300 MG/ML  SOLN COMPARISON:  Radiograph of same day.  CT of May 09, 2014. FINDINGS: CT CHEST FINDINGS Cardiovascular: Atherosclerosis of thoracic aorta is noted without aneurysm or dissection. Normal cardiac size. No pericardial effusion. Coronary artery calcifications are noted. Mediastinum/Nodes: No enlarged mediastinal, hilar, or axillary lymph nodes. Thyroid gland, trachea, and esophagus demonstrate no significant findings. Lungs/Pleura: Small right anterior pneumothorax is noted on the order of 15-20%. No pleural effusion is noted. Bilateral lower lobe atelectasis is noted. Musculoskeletal: Mildly  displaced right fifth, sixth and seventh rib fractures are noted. Extensive subcutaneous emphysema is seen in the right lateral chest wall. CT ABDOMEN PELVIS FINDINGS Hepatobiliary: No focal liver abnormality is seen. No gallstones, gallbladder wall thickening, or biliary dilatation. Pancreas: Unremarkable. No pancreatic ductal dilatation or surrounding inflammatory changes. Spleen: Normal in size without focal abnormality. Adrenals/Urinary Tract: Adrenal glands are unremarkable. Kidneys are normal, without renal calculi, focal lesion, or hydronephrosis. Bladder is unremarkable. Stomach/Bowel: Stomach is within normal limits. Appendix appears normal. No evidence of bowel wall thickening, distention, or inflammatory changes. Vascular/Lymphatic: Aortic atherosclerosis. No enlarged abdominal or pelvic lymph nodes. Reproductive: Mild prostatic enlargement is noted. Other: No abdominal wall hernia or abnormality. No abdominopelvic ascites. Musculoskeletal: Severe degenerative changes seen involving the right hip joint. No acute osseous abnormality is noted. IMPRESSION: Small right anterior and basilar pneumothorax is noted on the order of 15-20%. This pneumothorax was described on radiograph of same day. Mildly displaced fractures are seen involving the lateral portions of the right fifth, sixth and seventh ribs. Extensive subcutaneous emphysema is seen involving the right chest wall. Bibasilar atelectasis is noted, right greater than left. No traumatic abnormality seen in the abdomen or pelvis. Mild prostatic enlargement is noted. Severe degenerative joint disease of right hip. Aortic Atherosclerosis (ICD10-I70.0). Electronically Signed   By: JMarijo ConceptionM.D.   On: 12/11/2021 10:44   DG CHEST PORT 1 VIEW  Result Date: 12/14/2021 CLINICAL DATA:  Pain. Emphysema. Back evaluate for pneumothorax after chest tube removal EXAM: PORTABLE CHEST 1 VIEW COMPARISON:  12/13/2021 FINDINGS: Stable cardiomediastinal contours.  No significant pneumothorax identified. Lung volumes are low. Atelectasis in the left base and right midlung appears unchange. Right lateral rib fracture deformities are again noted. Similar amount of subcutaneous gas along the right chest wall. IMPRESSION: 1. No significant pneumothorax identified. 2. No change in bilateral areas of subsegmental atelectasis, right greater than left. 3. Right rib fractures. Electronically Signed   By: TQueen SloughD.  On: 12/14/2021 08:40   DG CHEST PORT 1 VIEW  Result Date: 12/13/2021 CLINICAL DATA:  Pneumothorax.  Status post chest tube removal. EXAM: PORTABLE CHEST 1 VIEW COMPARISON:  Chest x-ray from earlier same day. Chest x-ray dated 12/12/2021. FINDINGS: Questionable tiny residual pneumothorax at the outer margin of the RIGHT minor fissure. Stable atelectasis at the bilateral lung bases. No new lung findings. Heart size and mediastinal contours are stable. Stable subcutaneous emphysema along the RIGHT lateral chest wall. IMPRESSION: 1. Questionable tiny residual pneumothorax at the outer margin of the RIGHT minor fissure. Stable subcutaneous emphysema along the RIGHT lateral chest wall. 2. Stable atelectasis at the bilateral lung bases. Electronically Signed   By: Franki Cabot M.D.   On: 12/13/2021 14:58   DG Chest Port 1 View  Result Date: 12/13/2021 CLINICAL DATA:  Chest tube EXAM: PORTABLE CHEST 1 VIEW COMPARISON:  01/09/2022. FINDINGS: The heart size and mediastinal contours are stable. The right-sided chest tube is no longer seen. There is a small residual pneumothorax along the lateral lung border and air is also present in the minor fissure. Lung volumes are low and atelectasis is noted at the lung bases bilaterally. No pleural effusion. Stable rib fractures are noted on the right. IMPRESSION: 1. Right chest tube is no longer visualized on exam and there is a small residual pneumothorax. 2. Low lung volumes with atelectasis at the lung bases. 3. Stable rib  fractures on the right. Electronically Signed   By: Brett Fairy M.D.   On: 12/13/2021 04:14   DG Chest Port 1 View  Result Date: 12/12/2021 CLINICAL DATA:  Encounter for right pneumothorax. EXAM: PORTABLE CHEST 1 VIEW COMPARISON:  February 2, 23. FINDINGS: Right chest tube in place. No visible pneumothorax on this semi erect radiograph. Subcutaneous emphysema along the right chest wall. Low lung volumes. Similar bibasilar opacities. No definite pleural effusion. Similar mild enlargement the cardiac silhouette. Redemonstrated right rib fractures, better characterized on prior CT. IMPRESSION: 1. Right chest tube in place without visible pneumothorax. 2. Similar low lung volumes and bibasilar opacities, most likely atelectasis. Electronically Signed   By: Margaretha Sheffield M.D.   On: 12/12/2021 08:10   DG Chest Portable 1 View  Result Date: 12/11/2021 CLINICAL DATA:  Chest tube insertion. EXAM: PORTABLE CHEST 1 VIEW COMPARISON:  CT chest 12/11/2021, AP chest 12/11/2021 chest two views 10/28/2021 FINDINGS: New small pigtail catheter with tip overlying the medial right hemithorax. Interval decrease and near resolution of the prior tiny right apical pneumothorax. Medial right lower lung greater than left lower lung linear subsegmental atelectasis. No definite pleural effusion. Cardiac silhouette again appears mildly enlarged. Mediastinal contours are grossly within normal limits. Lateral right sixth rib mildly displaced fracture is again noted. The additional right fifth and seventh rib fractures seen on prior CT are less well visualized. Right lateral chest wall subcutaneous air is again seen. IMPRESSION:: IMPRESSION: 1. Interval placement of right-sided chest tube. Interval decrease and near resolution of the prior tiny right apical pneumothorax. 2. Right lateral acute rib fractures are again noted on radiographs and prior CT. Electronically Signed   By: Yvonne Kendall M.D.   On: 12/11/2021 13:12   DG Chest  Port 1 View  Result Date: 12/11/2021 CLINICAL DATA:  Status post fall.  Right lower anterior rib pain. EXAM: PORTABLE CHEST 1 VIEW COMPARISON:  10/28/2021 FINDINGS: Right infrahilar airspace disease likely reflecting atelectasis versus scarring. Left basilar atelectasis. Small right apical pneumothorax measuring less than 10%. No left pneumothorax. No  pleural effusion. Stable cardiomediastinal silhouette. Soft tissue emphysema along the right lateral chest wall a and right side of the neck. Right lateral sixth rib fracture partially visualized. IMPRESSION: 1. Small right apical pneumothorax measuring less than 10%. 2. Right lateral rib fracture partially visualized. Critical Value/emergent results were called by telephone at the time of interpretation on 12/11/2021 at 8:54 am to provider [facility] , who verbally acknowledged these results. Electronically Signed   By: Kathreen Devoid M.D.   On: 12/11/2021 08:55   DG FEMUR, MIN 2 VIEWS RIGHT  Result Date: 12/11/2021 CLINICAL DATA:  Right leg immobility after fall 2 days ago. EXAM: RIGHT FEMUR 2 VIEWS COMPARISON:  Right hip x-rays dated May 23, 2021. Right knee x-rays dated May 21, 2021. FINDINGS: No acute fracture or dislocation. Unchanged severe right hip and mild-to-moderate right knee osteoarthritis. IMPRESSION: 1. No acute osseous abnormality. 2. Unchanged severe right hip osteoarthritis. Electronically Signed   By: Titus Dubin M.D.   On: 12/11/2021 10:11     Assessment and Plan:   Paroxysmal atrial flutter, paroxysmal atrial fibrillation History of LV thrombus Need for chronic anticoagulation with Coumadin He has not had frequent falls. He reports only 2. He described a mechanical fall, not cardiac related. Will plan to resume his coumadin. INR 3.4 --> 4.1 --> 2.7 --> 1.9 Resume coumadin at prior regimen.   Chronic systolic heart failure with EF recovery Suspected ischemic etiology Patient has refused heart catheterization in the past.   He has poor insight into his health and history of noncompliance.  Heart catheterization has no longer been pursued during recent visits.   Risk Assessment/Risk Scores:   New York Heart Association (NYHA) Functional Class NYHA Class II  CHA2DS2-VASc Score = 4   This indicates a 4.8% annual risk of stroke. The patient's score is based upon: CHF History: 1 HTN History: 1 Diabetes History: 1 Stroke History: 0 Vascular Disease History: 0 Age Score: 1 Gender Score: 0     For questions or updates, please contact [facility] Please consult [email or web address] for contact info under    Signed, Ledora Bottcher, PA  12/14/2021 12:41 PM  Agree with assessment of APP/PA Fabian Sharp. I have reviewed all pertinent hemodynamic, laboratory, and cardiac studies. 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 He has refused LHC. He presented here with a mechanical fall and admitted to the trauma service. He's had multiple rib fractures with PTX. He underwent chest tube placement. This has resolved. He is eligible to continue [facility] from a surgical standpoint.  CT head did not show any SDH. Considering he reports ~ 2 falls and has had no repeat admissions for falls. Will recommend continuing coumadin for afib. Rhythm here is sinus ,PACs, wenckebach maintained on amiodarone. He is euvolemic. He can continue his home cardiac regimen. He has a follow up appointment with Dr. Haroldine Laws on 12/26/2021. With no other cardiology concerns, we will sign off. Thank you for the consult and don't hesitate to reach out for further questions.   Vitals:   12/14/21 0718 12/14/21 0900  BP:  130/70  Pulse:  85  Resp:  16  Temp:  98 F (36.7 C)  SpO2: 95% 95%   Physical Exam Gen: well appearing Neuro: alert and oriented CV: r,r,r no murmurs. No JVD Vasc: 2+ radial pulses Pulm: nl wob, CLAB Abd: non distended Ext: Nl thumb amputation, L toe amputation, no LE edema Skin:  warm and well  perfused Psych: stable mood

## 2021-12-14 NOTE — Progress Notes (Signed)
Progress Note     Subjective: Still having pain in his ribs. Denies SHOB. Asking to see therapies today. Not using his IS. Chest tube apparently was pulled out overnight   Objective: Vital signs in last 24 hours: Temp:  [97.7 F (36.5 C)-98 F (36.7 C)] 97.7 F (36.5 C) (02/04 2056) Pulse Rate:  [84-87] 87 (02/04 2056) Resp:  [16] 16 (02/04 2056) BP: (133-135)/(67-69) 133/67 (02/04 2056) SpO2:  [94 %-95 %] 95 % (02/04 2056)    Intake/Output from previous day: 02/04 0701 - 02/05 0700 In: -  Out: 500 [Urine:500] Intake/Output this shift: No intake/output data recorded.  PE: General: pleasant, WD, male who is laying in bed in NAD HEENT: head is normocephalic, atraumatic.  Mouth is pink and moist Heart: rrr.  Palpable radial pulses bilaterally Lungs: CTAB, Respiratory effort nonlabored. CT in place on suction with SS output (60 ml total) in canister. Dressing c/d/I. No air leak, placed on WS Abd: soft, NT, ND MSK: all 4 extremities are symmetrical with no cyanosis, clubbing, or edema. Skin: warm and dry Psych: A&Ox3 with an appropriate affect.    Lab Results:  Recent Labs    12/12/21 0151  WBC 8.5  HGB 12.1*  HCT 37.6*  PLT 157   BMET Recent Labs    12/12/21 0151 12/13/21 0343  NA 138 136  K 4.4 4.7  CL 104 104  CO2 26 22  GLUCOSE 127* 136*  BUN 36* 29*  CREATININE 1.86* 1.73*  CALCIUM 8.4* 8.4*   PT/INR Recent Labs    12/13/21 0343 12/14/21 0223  LABPROT 29.0* 22.1*  INR 2.7* 1.9*   CMP     Component Value Date/Time   NA 136 12/13/2021 0343   K 4.7 12/13/2021 0343   CL 104 12/13/2021 0343   CO2 22 12/13/2021 0343   GLUCOSE 136 (H) 12/13/2021 0343   BUN 29 (H) 12/13/2021 0343   CREATININE 1.73 (H) 12/13/2021 0343   CREATININE 1.18 06/13/2021 0941   CALCIUM 8.4 (L) 12/13/2021 0343   PROT 8.0 12/11/2021 0851   ALBUMIN 3.9 12/11/2021 0851   AST 30 12/11/2021 0851   ALT 42 12/11/2021 0851   ALKPHOS 32 (L) 12/11/2021 0851   BILITOT 1.0  12/11/2021 0851   GFRNONAA 42 (L) 12/13/2021 0343   GFRAA >60 08/06/2020 1418   Lipase  No results found for: LIPASE     Studies/Results: DG CHEST PORT 1 VIEW  Result Date: 12/14/2021 CLINICAL DATA:  Pain. Emphysema. Back evaluate for pneumothorax after chest tube removal EXAM: PORTABLE CHEST 1 VIEW COMPARISON:  12/13/2021 FINDINGS: Stable cardiomediastinal contours. No significant pneumothorax identified. Lung volumes are low. Atelectasis in the left base and right midlung appears unchange. Right lateral rib fracture deformities are again noted. Similar amount of subcutaneous gas along the right chest wall. IMPRESSION: 1. No significant pneumothorax identified. 2. No change in bilateral areas of subsegmental atelectasis, right greater than left. 3. Right rib fractures. Electronically Signed   By: Kerby Moors M.D.   On: 12/14/2021 08:40   DG CHEST PORT 1 VIEW  Result Date: 12/13/2021 CLINICAL DATA:  Pneumothorax.  Status post chest tube removal. EXAM: PORTABLE CHEST 1 VIEW COMPARISON:  Chest x-ray from earlier same day. Chest x-ray dated 12/12/2021. FINDINGS: Questionable tiny residual pneumothorax at the outer margin of the RIGHT minor fissure. Stable atelectasis at the bilateral lung bases. No new lung findings. Heart size and mediastinal contours are stable. Stable subcutaneous emphysema along the RIGHT lateral chest wall. IMPRESSION:  1. Questionable tiny residual pneumothorax at the outer margin of the RIGHT minor fissure. Stable subcutaneous emphysema along the RIGHT lateral chest wall. 2. Stable atelectasis at the bilateral lung bases. Electronically Signed   By: Franki Cabot M.D.   On: 12/13/2021 14:58   DG Chest Port 1 View  Result Date: 12/13/2021 CLINICAL DATA:  Chest tube EXAM: PORTABLE CHEST 1 VIEW COMPARISON:  01/09/2022. FINDINGS: The heart size and mediastinal contours are stable. The right-sided chest tube is no longer seen. There is a small residual pneumothorax along the  lateral lung border and air is also present in the minor fissure. Lung volumes are low and atelectasis is noted at the lung bases bilaterally. No pleural effusion. Stable rib fractures are noted on the right. IMPRESSION: 1. Right chest tube is no longer visualized on exam and there is a small residual pneumothorax. 2. Low lung volumes with atelectasis at the lung bases. 3. Stable rib fractures on the right. Electronically Signed   By: Brett Fairy M.D.   On: 12/13/2021 04:14    Anti-infectives: Anti-infectives (From admission, onward)    None        Assessment/Plan  Fall down 3 steps R 5-7 rib fxs with PTX - CT placed in ED 2/2. Since removed, resolved R hip OA - read as fracture on pelvic xray but femur xray and CT abd/pel negative. ortho consulted and note right hip OA which could benefit from THA. F/u Dr. Sammuel Hines. WBAT RLE HTN - home meds CAD A fib - on amiodarone baseline, hold coumadin, INR 3.4 on admit, now 4.1 T2DM - resume home meds CKD 3 - creatinine improving, diet as tolerated   FEN: carb mod, IVF - d/c ID: none currently VTE: hold coumadin, SCDs   I reviewed Consultant therapy notes, last 24 h vitals and pain scores, last 48 h intake and output, last 24 h labs and trends, and last 24 h imaging results.  This care required moderate level of medical decision making.    LOS: 3 days   Ileana Roup, [facility] 12/14/2021, 9:35 AM Please see Amion for pager number during day hours 7:00am-4:30pm

## 2021-12-14 NOTE — Evaluation (Signed)
Occupational Therapy Evaluation Patient Details Name: [name] MRN: [id number] DOB: 13-Dec-1950 Today's Date: 12/14/2021   History of Present Illness Pt is a 71 y.o. male admitted 12/11/21 after fall off porch on 1/31 resulting in R hip, back and chest pain. Initial workup showed R femoral neck fx, but CT scan ruled this out; pt with advanced R hip OA. Pt also with R rib fxs and PTX; s/p chest tube placement in ED. PMH includes CHF, DM, CKD, HTN, OSA, PVD (s/p L toe amputation), afib, ADHD, chronic R hip pain.   Clinical Impression   PTA pt lives at home with his daughter/family and has available help from "others" for IADL tasks. Pt tangential and internally distracted throughout session. Able to progress EOB and OOB to chair with min guard A @ RW level at his own pace.Attempted to educate pt on compensatory strategies to decrease pain with mobility, however pt had his own techniques on how to move. Encouraged use of incentive spirometer - pt able to pull 1000 ml; encouraged use throughout the day. Pt states that since he made it to the chair this am, he would walk this pm. Mobility tech notified. Acute OT to follow and recommend HHOT after DC.      Recommendations for follow up therapy are one component of a multi-disciplinary discharge planning process, led by the attending physician.  Recommendations may be updated based on patient status, additional functional criteria and insurance authorization.   Follow Up Recommendations  Home health OT    Assistance Recommended at Discharge Intermittent Supervision/Assistance  Patient can return home with the following A little help with walking and/or transfers;A little help with bathing/dressing/bathroom;Assistance with cooking/housework;Help with stairs or ramp for entrance;Assist for transportation    Functional Status Assessment  Patient has had a recent decline in their functional status and demonstrates the ability to make significant  improvements in function in a reasonable and predictable amount of time.  Equipment Recommendations  BSC/3in1    Recommendations for Other Services       Precautions / Restrictions Precautions Precautions: Fall Precaution Comments: rib fxs      Mobility Bed Mobility Overal bed mobility: Needs Assistance Bed Mobility: Supine to Sit     Supine to sit: Min guard     General bed mobility comments: Heavy use of rails    Transfers Overall transfer level: Needs assistance Equipment used: Rolling walker (2 wheels) Transfers: Sit to/from Stand, Bed to chair/wheelchair/BSC Sit to Stand: Min guard     Step pivot transfers: Min guard     General transfer comment: Pt has his technique, apparently slides to EOB with R hip in more extension and then uses his strong L leg to push up. Keeps R hip in more extension to ?decrease pain wtih mobility      Balance Overall balance assessment: Needs assistance Sitting-balance support: Bilateral upper extremity supported, Feet supported Sitting balance-Leahy Scale: Fair       Standing balance-Leahy Scale: Fair                             ADL either performed or assessed with clinical judgement   ADL Overall ADL's : Needs assistance/impaired     Grooming: Set up   Upper Body Bathing: Minimal assistance   Lower Body Bathing: Moderate assistance   Upper Body Dressing : Moderate assistance   Lower Body Dressing: Moderate assistance;Sit to/from stand   Toilet Transfer: Minimal assistance;Rolling walker (  2 wheels)   Toileting- Clothing Manipulation and Hygiene: Minimal assistance       Functional mobility during ADLs: Minimal assistance;Rolling walker (2 wheels) General ADL Comments: Pt guides how he moves as he is an "expert in balance and flexibility" and knows about levers and how to breath May benefit from use of AE     Vision Baseline Vision/History: 3 Glaucoma Additional Comments: kept eyes closed  majority of session; R eye watering; states he sees "through wax paper"     Perception     Praxis      Pertinent Vitals/Pain Pain Assessment Pain Assessment: Faces Faces Pain Scale: Hurts even more Pain Location: R ribs Pain Descriptors / Indicators: Aching, Tender, Discomfort Pain Intervention(s): Limited activity within patient's tolerance, Monitored during session     Hand Dominance Right   Extremity/Trunk Assessment Upper Extremity Assessment Upper Extremity Assessment: Overall WFL for tasks assessed (RUE limited by pain only)   Lower Extremity Assessment Lower Extremity Assessment: Defer to PT evaluation   Cervical / Trunk Assessment Cervical / Trunk Assessment: Other exceptions (altered due to R hip pain)   Communication Communication Communication: No difficulties   Cognition Arousal/Alertness: Awake/alert Behavior During Therapy: Agitated Overall Cognitive Status: No family/caregiver present to determine baseline cognitive functioning                                 General Comments: most likely close to baseline; tangential; inernally distracted; verbose; eyes closed 95% of session     General Comments       Exercises Exercises: Other exercises Other Exercises Other Exercises: encouraged incentive spriomenter - completed 5x, able to pull 1000 ml   Shoulder Instructions      Home Living Family/patient expects to be discharged to:: Private residence Living Arrangements: Children;Other relatives (Reports living with daughter/granddaughter) Available Help at Discharge: Family;Available PRN/intermittently Type of Home: House Home Access: Stairs to enter [employer] of Steps: 4   Home Layout: One level     Bathroom Shower/Tub: Chief Strategy Officer: Standard Bathroom Accessibility: Yes How Accessible: Accessible via walker Home Equipment: Rolling Walker (2 wheels);Cane - single point;Rollator (4 wheels);Other  (comment) (walking sticks)   Additional Comments: Lives with daughter and granddaughter      Prior Functioning/Environment Prior Level of Function : Independent/Modified Independent             Mobility Comments: Pt reports being previously mod I with use of RW.  States familys helps with cooking/cleaning as needed. ADLs Comments: Daughter assists with LB dressing due to prior R groin injury. Reports baseline decreased vision; does not wear glasses        OT Problem List: Decreased strength;Decreased activity tolerance;Impaired balance (sitting and/or standing);Decreased knowledge of use of DME or AE;Decreased safety awareness;Pain      OT Treatment/Interventions: Self-care/ADL training;Therapeutic exercise;Energy conservation;DME and/or AE instruction;Therapeutic activities;Patient/family education;Balance training    OT Goals(Current goals can be found in the care plan section) Acute Rehab OT Goals Patient Stated Goal: to get better and gohome OT Goal Formulation: With patient Time For Goal Achievement: 12/28/21 Potential to Achieve Goals: Good  OT Frequency: Min 2X/week    Co-evaluation              AM-PAC OT "6 Clicks" Daily Activity     Outcome Measure Help from another person eating meals?: None Help from another person taking care of personal grooming?: A Little Help from  another person toileting, which includes using toliet, bedpan, or urinal?: A Little Help from another person bathing (including washing, rinsing, drying)?: A Lot Help from another person to put on and taking off regular upper body clothing?: A Lot Help from another person to put on and taking off regular lower body clothing?: A Lot 6 Click Score: 16   End of Session Equipment Utilized During Treatment: Rolling walker (2 wheels) Nurse Communication: Mobility status  Activity Tolerance: Patient tolerated treatment well Patient left: in chair;with call bell/phone within reach;with chair alarm  set  OT Visit Diagnosis: Unsteadiness on feet (R26.81);Muscle weakness (generalized) (M62.81);Other abnormalities of gait and mobility (R26.89);Repeated falls (R29.6);Pain Pain - part of body:  (ribs/R side)                Time: 2703-5009 OT Time Calculation (min): 23 min Charges:  OT General Charges $OT Visit: 1 Visit OT Evaluation $OT Eval Moderate Complexity: 1 Mod OT Treatments $Self Care/Home Management : 8-22 mins  Luisa Dago, OT/L   Acute OT Clinical Specialist Acute Rehabilitation Services Pager [phone] Office [phone]   [facility] 12/14/2021, 1:22 PM

## 2021-12-14 NOTE — Progress Notes (Signed)
Hx afib on coumadin - INR 1.9 today. I have asked [facility] cardiologist on call, Dr.Branch, to please comment on if this patient remains a candidate for anticoagulation so I can resume his coumadin if recommended. Appreciate their assistance.   Obie Dredge, PA-C [facility] Please see Amion for pager number during day hours 7:00am-4:30pm

## 2021-12-15 ENCOUNTER — Other Ambulatory Visit (HOSPITAL_COMMUNITY): Payer: Self-pay

## 2021-12-15 LAB — PROTIME-INR
INR: 1.5 — ABNORMAL HIGH (ref 0.8–1.2)
Prothrombin Time: 18.1 seconds — ABNORMAL HIGH (ref 11.4–15.2)

## 2021-12-15 LAB — GLUCOSE, CAPILLARY
Glucose-Capillary: 171 mg/dL — ABNORMAL HIGH (ref 70–99)
Glucose-Capillary: 119 mg/dL — ABNORMAL HIGH (ref 70–99)
Glucose-Capillary: 182 mg/dL — ABNORMAL HIGH (ref 70–99)

## 2021-12-15 MED ORDER — ACETAMINOPHEN 500 MG PO TABS: 1000.0000 mg | ORAL_TABLET | Freq: Four times a day (QID) | ORAL | 0 refills | Status: DC | PRN

## 2021-12-15 MED ORDER — TRAMADOL HCL 50 MG PO TABS
50.0000 mg | ORAL_TABLET | Freq: Four times a day (QID) | ORAL | 0 refills | Status: DC | PRN
  Filled 2021-12-15: qty 20, 5d supply, fill #0

## 2021-12-15 MED ORDER — METHOCARBAMOL 500 MG PO TABS
500.0000 mg | ORAL_TABLET | Freq: Three times a day (TID) | ORAL | 0 refills | Status: DC | PRN
  Filled 2021-12-15: qty 50, 17d supply, fill #0

## 2021-12-15 NOTE — Discharge Instructions (Signed)
RIB FRACTURES  HOME INSTRUCTIONS   PAIN CONTROL:  Pain is best controlled by a usual combination of three different methods TOGETHER:  Ice/Heat Over the counter pain medication Prescription pain medication You may experience some swelling and bruising in area of broken ribs. Ice packs or heating pads (30-60 minutes up to 6 times a day) will help. Use ice for the first few days to help decrease swelling and bruising, then switch to heat to help relax tight/sore spots and speed recovery. Some people prefer to use ice alone, heat alone, alternating between ice & heat. Experiment to what works for you. Swelling and bruising can take several weeks to resolve.  It is helpful to take an over-the-counter pain medication regularly for the first few weeks. Choose one of the following that works best for you:  Naproxen (Aleve, etc) Two 220mg tabs twice a day Ibuprofen (Advil, etc) Three 200mg tabs four times a day (every meal & bedtime) Acetaminophen (Tylenol, etc) 500-650mg four times a day (every meal & bedtime) A prescription for pain medication (such as oxycodone, hydrocodone, etc) may be given to you upon discharge. Take your pain medication as prescribed.  If you are having problems/concerns with the prescription medicine (does not control pain, nausea, vomiting, rash, itching, etc), please call us [phone] to see if we need to switch you to a different pain medicine that will work better for you and/or control your side effect better. If you need a refill on your pain medication, please contact your pharmacy. They will contact our office to request authorization. Prescriptions will not be filled after 5 pm or on week-ends. Avoid getting constipated. When taking pain medications, it is common to experience some constipation. Increasing fluid intake and taking a fiber supplement (such as Metamucil, Citrucel, FiberCon, MiraLax, etc) 1-2 times a day regularly will usually help prevent this problem  from occurring. A mild laxative (prune juice, Milk of Magnesia, MiraLax, etc) should be taken according to package directions if there are no bowel movements after 48 hours.  Watch out for diarrhea. If you have many loose bowel movements, simplify your diet to bland foods & liquids for a few days. Stop any stool softeners and decrease your fiber supplement. Switching to mild anti-diarrheal medications (Kayopectate, Pepto Bismol) can help. If this worsens or does not improve, please call us. FOLLOW UP  If a follow up appointment is needed one will be scheduled for you. If none is needed with our trauma team, please follow up with your primary care provider within 2-3 weeks from discharge. Please call [facility] at [phone] if you have any questions about follow up.  If you have any orthopedic or other injuries you will need to follow up as outlined in your follow up instructions.   WHEN TO CALL US [phone]:  Poor pain control Reactions / problems with new medications (rash/itching, nausea, etc)  Fever over 101.5 F (38.5 C) Worsening swelling or bruising Worsening pain, productive cough, difficulty breathing or any other concerning symptoms  The clinic staff is available to answer your questions during regular business hours (8:30am-5pm). Please don't hesitate to call and ask to speak to one of our nurses for clinical concerns.  If you have a medical emergency, go to the nearest emergency room or call 911.  A surgeon from [facility] is always on call at the hospitals   [facility]  [address], [address], [place], [place] [address] ?  MAIN: [phone] ? TOLL FREE: [phone] ?  FAX [phone]  [email or web address]      Information on Rib Fractures  A rib fracture is a break or crack in one of the bones of the ribs. The ribs are long, curved bones that wrap around your chest and attach to your spine and your breastbone. The  ribs protect your heart, lungs, and other organs in the chest. A broken or cracked rib is often painful but is not usually serious. Most rib fractures heal on their own over time. However, rib fractures can be more serious if multiple ribs are broken or if broken ribs move out of place and push against other structures or organs. What are the causes? This condition is caused by: Repetitive movements with high force, such as pitching a baseball or having severe coughing spells. A direct blow to the chest, such as a sports injury, a car accident, or a fall. Cancer that has spread to the bones, which can weaken bones and cause them to break. What are the signs or symptoms? Symptoms of this condition include: Pain when you breathe in or cough. Pain when someone presses on the injured area. Feeling short of breath. How is this diagnosed? This condition is diagnosed with a physical exam and medical history. Imaging tests may also be done, such as: Chest X-ray. CT scan. MRI. Bone scan. Chest ultrasound. How is this treated? Treatment for this condition depends on the severity of the fracture. Most rib fractures usually heal on their own in 1-3 months. Sometimes healing takes longer if there is a cough that does not stop or if there are other activities that make the injury worse (aggravating factors). While you heal, you will be given medicines to control the pain. You will also be taught deep breathing exercises. Severe injuries may require hospitalization or surgery. Follow these instructions at home: Managing pain, stiffness, and swelling If directed, apply ice to the injured area. Put ice in a plastic bag. Place a towel between your skin and the bag. Leave the ice on for 20 minutes, 2-3 times a day. Take over-the-counter and prescription medicines only as told by your health care provider. Activity Avoid a lot of activity and any activities or movements that cause pain. Be careful during  activities and avoid bumping the injured rib. Slowly increase your activity as told by your health care provider. General instructions Do deep breathing exercises as told by your health care provider. This helps prevent pneumonia, which is a common complication of a broken rib. Your health care provider may instruct you to: Take deep breaths several times a day. Try to cough several times a day, holding a pillow against the injured area. Use a device called incentive spirometer to practice deep breathing several times a day. Drink enough fluid to keep your urine pale yellow. Do not wear a rib belt or binder. These restrict breathing, which can lead to pneumonia. Keep all follow-up visits as told by your health care provider. This is important. Contact a health care provider if: You have a fever. Get help right away if: You have difficulty breathing or you are short of breath. You develop a cough that does not stop, or you cough up thick or bloody sputum. You have nausea, vomiting, or pain in your abdomen. Your pain gets worse and medicine does not help. Summary A rib fracture is a break or crack in one of the bones of the ribs. A broken or cracked rib is   often painful but is not usually serious. Most rib fractures heal on their own over time. Treatment for this condition depends on the severity of the fracture. Avoid a lot of activity and any activities or movements that cause pain. This information is not intended to replace advice given to you by your health care provider. Make sure you discuss any questions you have with your health care provider. Document Released: 10/26/2005 Document Revised: 01/25/2017 Document Reviewed: 01/25/2017 Elsevier Interactive Patient Education  2019 [employer]vier Inc[employer]  

## 2021-12-15 NOTE — Progress Notes (Signed)
Physical Therapy Treatment Patient Details Name: [name] MRN: [id number] DOB: [birth year] Today's Date: 12/15/2021   History of Present Illness Pt is a 71 y.o. male admitted 12/11/21 after fall off porch on 1/31 resulting in R hip, back and chest pain. CT r/o fx, advanced R hip OA. Rt 5-7 rib fxs and PTX; s/p chest tube placement in ED. PMHx: CHF, DM, CKD, HTN, OSA, PVD (s/p L toe amputation), afib, ADHD, chronic R hip pain.    PT Comments    Pt awake and complaining about needing to have a BM on arrival but when offering to get him to the bathroom or BSC he refuses. Once EOB denied need for BM. Pt able to progress gait distance today and reports rib pain as 4/10. Pt with cognitive and problem solving deficits but no family present to state baseline function. Pt moves with reliance on LUE and LLE as well as rails for standing. Pt with education for safety with transfers and hospital policy but does not demonstrate learning. Assist at home with HHPT appropriate.      Recommendations for follow up therapy are one component of a multi-disciplinary discharge planning process, led by the attending physician.  Recommendations may be updated based on patient status, additional functional criteria and insurance authorization.  Follow Up Recommendations  Home health PT     Assistance Recommended at Discharge Intermittent Supervision/Assistance  Patient can return home with the following A little help with walking and/or transfers;A little help with bathing/dressing/bathroom;Assistance with cooking/housework;Assist for transportation;Help with stairs or ramp for entrance   Equipment Recommendations  None recommended by PT (pt would benefit from [facility] to elevate toilet but he declines)    Recommendations for Other Services       Precautions / Restrictions Precautions Precautions: Fall     Mobility  Bed Mobility   Bed Mobility: Supine to Sit     Supine to sit: Min assist, HOB  elevated     General bed mobility comments: pt with use of left rail and reaching out for additional UE support to elevate trunk from surface. Pt elevating trunk then pivoting to left.    Transfers Overall transfer level: Needs assistance   Transfers: Sit to/from Stand Sit to Stand: Min guard           General transfer comment: pt scoots to EOB rotates into left hip with reliance on rail to stand with weight shifted to LLE. pt not receptive to cues for hand placement of flattening bed to simulate home environment. pt able to stand from toilet with reliance on rail to right    Ambulation/Gait Ambulation/Gait assistance: Min guard Gait Distance (Feet): 300 Feet Assistive device: Rolling walker (2 wheels) Gait Pattern/deviations: Step-through pattern, Decreased stride length   Gait velocity interpretation: 1.31 - 2.62 ft/sec, indicative of limited community ambulator   General Gait Details: cues for direction with pt stating need to have BM 20' prior to reaching stairs and deferred stairs to return to room   Stairs             Wheelchair Mobility    Modified Rankin (Stroke Patients Only)       Balance Overall balance assessment: Needs assistance   Sitting balance-Leahy Scale: Fair Sitting balance - Comments: toilet and EOB without physical assist  Cognition Arousal/Alertness: Awake/alert Behavior During Therapy: Restless Overall Cognitive Status: No family/caregiver present to determine baseline cognitive functioning                                 General Comments: tangential, internally distracted, not receptive to cues or direction, demonstrates lack of safety awareness        Exercises General Exercises - Lower Extremity Long Arc Quad: AROM, Right, Seated, 10 reps    General Comments        Pertinent Vitals/Pain Pain Assessment Pain Score: 4  Pain Location: chest with  movement Pain Descriptors / Indicators: Guarding, Discomfort Pain Intervention(s): Limited activity within patient's tolerance, Monitored during session, Repositioned    Home Living                          Prior Function            PT Goals (current goals can now be found in the care plan section) Progress towards PT goals: Progressing toward goals    Frequency    Min 3X/week      PT Plan Current plan remains appropriate    Co-evaluation              AM-PAC PT "6 Clicks" Mobility   Outcome Measure  Help needed turning from your back to your side while in a flat bed without using bedrails?: A Little Help needed moving from lying on your back to sitting on the side of a flat bed without using bedrails?: A Little Help needed moving to and from a bed to a chair (including a wheelchair)?: A Little Help needed standing up from a chair using your arms (e.g., wheelchair or bedside chair)?: A Little Help needed to walk in hospital room?: A Little Help needed climbing 3-5 steps with a railing? : A Lot 6 Click Score: 17    End of Session Equipment Utilized During Treatment: Gait belt Activity Tolerance: Patient tolerated treatment well Patient left: in chair;with call bell/phone within reach;with chair alarm set Nurse Communication: Mobility status PT Visit Diagnosis: Other abnormalities of gait and mobility (R26.89);History of falling (Z91.81);Pain     Time: [id number] PT Time Calculation (min) (ACUTE ONLY): 34 min  Charges:  $Gait Training: 8-22 mins $Therapeutic Activity: 8-22 mins                     Kennethia Lynes P, PT Acute Rehabilitation Services Pager: [phone] Office: [place] 12/15/2021, 11:30 AM

## 2021-12-15 NOTE — Progress Notes (Signed)
Progress Note     Subjective: Pain improved and he was able to get OOB and ambulate with therapies yesterday. Tolerating diet. No respiratory complaints  Objective: Vital signs in last 24 hours: Temp:  [97.3 F (36.3 C)-98.3 F (36.8 C)] 97.4 F (36.3 C) (02/06 0730) Pulse Rate:  [74-88] 88 (02/06 0730) Resp:  [16-21] 17 (02/06 0730) BP: (112-134)/(66-85) 134/85 (02/06 0730) SpO2:  [92 %-98 %] 97 % (02/06 0730)    Intake/Output from previous day: 02/05 0701 - 02/06 0700 In: 360 [P.O.:360] Out: 950 [Urine:950] Intake/Output this shift: No intake/output data recorded.  PE: General: pleasant, WD, male who is laying in bed in NAD HEENT: head is normocephalic, atraumatic.  Mouth is pink and moist Heart: rrr.  Palpable radial pulses bilaterally Lungs: CTAB, Respiratory effort nonlabored. Site of prior CT with dressing c/d/I Abd: soft, NT, ND MSK: all 4 extremities are symmetrical with no cyanosis, clubbing, or edema. Skin: warm and dry Psych: A&Ox3 with an appropriate affect.    Lab Results:  No results for input(s): WBC, HGB, HCT, PLT in the last 72 hours.  BMET Recent Labs    12/13/21 0343  NA 136  K 4.7  CL 104  CO2 22  GLUCOSE 136*  BUN 29*  CREATININE 1.73*  CALCIUM 8.4*    PT/INR Recent Labs    12/14/21 0223 12/15/21 0236  LABPROT 22.1* 18.1*  INR 1.9* 1.5*    CMP     Component Value Date/Time   NA 136 12/13/2021 0343   K 4.7 12/13/2021 0343   CL 104 12/13/2021 0343   CO2 22 12/13/2021 0343   GLUCOSE 136 (H) 12/13/2021 0343   BUN 29 (H) 12/13/2021 0343   CREATININE 1.73 (H) 12/13/2021 0343   CREATININE 1.18 06/13/2021 0941   CALCIUM 8.4 (L) 12/13/2021 0343   PROT 8.0 12/11/2021 0851   ALBUMIN 3.9 12/11/2021 0851   AST 30 12/11/2021 0851   ALT 42 12/11/2021 0851   ALKPHOS 32 (L) 12/11/2021 0851   BILITOT 1.0 12/11/2021 0851   GFRNONAA 42 (L) 12/13/2021 0343   GFRAA >60 08/06/2020 1418   Lipase  No results found for:  LIPASE     Studies/Results: DG CHEST PORT 1 VIEW  Result Date: 12/14/2021 CLINICAL DATA:  Pain. Emphysema. Back evaluate for pneumothorax after chest tube removal EXAM: PORTABLE CHEST 1 VIEW COMPARISON:  12/13/2021 FINDINGS: Stable cardiomediastinal contours. No significant pneumothorax identified. Lung volumes are low. Atelectasis in the left base and right midlung appears unchange. Right lateral rib fracture deformities are again noted. Similar amount of subcutaneous gas along the right chest wall. IMPRESSION: 1. No significant pneumothorax identified. 2. No change in bilateral areas of subsegmental atelectasis, right greater than left. 3. Right rib fractures. Electronically Signed   By: Signa Kell M.D.   On: 12/14/2021 08:40   DG CHEST PORT 1 VIEW  Result Date: 12/13/2021 CLINICAL DATA:  Pneumothorax.  Status post chest tube removal. EXAM: PORTABLE CHEST 1 VIEW COMPARISON:  Chest x-ray from earlier same day. Chest x-ray dated 12/12/2021. FINDINGS: Questionable tiny residual pneumothorax at the outer margin of the RIGHT minor fissure. Stable atelectasis at the bilateral lung bases. No new lung findings. Heart size and mediastinal contours are stable. Stable subcutaneous emphysema along the RIGHT lateral chest wall. IMPRESSION: 1. Questionable tiny residual pneumothorax at the outer margin of the RIGHT minor fissure. Stable subcutaneous emphysema along the RIGHT lateral chest wall. 2. Stable atelectasis at the bilateral lung bases. Electronically Signed  By: Bary Richard M.D.   On: 12/13/2021 14:58    Anti-infectives: Anti-infectives (From admission, onward)    None        Assessment/Plan  Fall down 3 steps R 5-7 rib fxs with PTX - CT placed in ED 2/2. Since removed, resolved R hip OA - read as fracture on pelvic xray but femur xray and CT abd/pel negative. ortho consulted and note right hip OA which could benefit from THA. F/u Dr. Steward Drone. WBAT RLE HTN - home meds CAD A fib - on  amiodarone baseline, hold coumadin, INR 3.4 on admit, now 1.5. cardiology consulted and recommend resuming coumadin. Will discuss timing with attending and likely resume today T2DM - resume home meds CKD 3 - creatinine improving, diet as tolerated   FEN: carb mod, IVF - d/c ID: none currently VTE: hold coumadin, SCDs   Dispo: resume coumadin. PT/OT recc HH therapies.  I reviewed Consultant therapy, cardiology notes, last 24 h vitals and pain scores, last 48 h intake and output, and last 24 h labs and trends.  This care required moderate level of medical decision making.    LOS: 4 days   Eric Form, [facility] 12/15/2021, 8:13 AM Please see Amion for pager number during day hours 7:00am-4:30pm

## 2021-12-15 NOTE — Progress Notes (Signed)
°  Case discussed with Dr. Bobbye Morton  Patient currently on anticoagulation for stroke prophylaxis in setting of chronic AF (failed DC-CV in 12/22).  He presented with mechanical fall resulting in multiple rib fractures and PTX in setting of INR 3.4/  I reviewed his TEE from 12/22 and he does not have an LV thrombus.   Given his known noncompliance with meds and f/u, and fall with significant trauma, I think the risk of ongoing AC currently outweighs the benefits.   I will revisit this with him if/when he comes to f/u in HF Clinic and he proves stability without recurrent falls. Ok to leave off warfarin for now. Can consider Watchman device.   D/w Dr. Bobbye Morton personally.   Glori Bickers, MD  10:37 AM

## 2021-12-15 NOTE — Care Management Important Message (Signed)
Important Message  Patient Details  Name: [name] MRN: [id number] Date of Birth: 28-Apr-1951   Medicare Important Message Given:  Yes     Sherilyn Banker 12/15/2021, 11:49 AM

## 2021-12-15 NOTE — TOC Transition Note (Signed)
Transition of Care [facility]) - CM/SW Discharge Note   Patient Details  Name: [name] MRN: [id number] Date of Birth: [birth year]  [facility] CM/SW Contact:  Glennon Mac, RN Phone Number: 12/15/2021, 12:54 PM   Clinical Narrative:    Pt is a 71 y.o. male admitted 12/11/21 after fall off porch on 1/31 resulting in R hip, back and chest pain. CT r/o fx, advanced R hip OA. Rt 5-7 rib fxs and PTX; s/p chest tube placement in ED. PMHx: CHF, DM, CKD, HTN, OSA, PVD (s/p L toe amputation), afib, ADHD, chronic R hip pain. PTA, pt independent and living at home with daughter/granddaughter; patient states family able to offer assistance at dc.  PT/OT recommending HH follow up, and patient is reluctantly agreeable.  He denies need for [facility] at home. Referral to [facility] for continued therapies at home.      Final next level of care: Home w Home Health Services Barriers to Discharge: Barriers Resolved   Patient Goals and CMS Choice Patient states their goals for this hospitalization and ongoing recovery are:: to go home CMS Medicare.gov Compare Post Acute Care list provided to:: Patient Choice offered to / list presented to : Patient                        Discharge Plan and Services   Discharge Planning Services: CM Consult Post Acute Care Choice: Home Health                    HH Arranged: PT, OT [facility] Agency: [facility]   Time [facility] Agency Contacted: 1215 Representative spoke with at [facility] Agency: Lorenza Chick  Social Determinants of Health (SDOH) Interventions     Readmission Risk Interventions No flowsheet data found.  Quintella Baton, RN, BSN  Trauma/Neuro ICU Case Manager [phone]

## 2021-12-15 NOTE — Plan of Care (Signed)
Goals met

## 2021-12-15 NOTE — Progress Notes (Signed)
Occupational Therapy Treatment Patient Details Name: [name] MRN: [id number] DOB: 05-Dec-1950 Today's Date: 12/15/2021   History of present illness Pt is a 71 y.o. male admitted 12/11/21 after fall off porch on 1/31 resulting in R hip, back and chest pain. CT r/o fx, advanced R hip OA. Rt 5-7 rib fxs and PTX; s/p chest tube placement in ED. PMHx: CHF, DM, CKD, HTN, OSA, PVD (s/p L toe amputation), afib, ADHD, chronic R hip pain.   OT comments  Pt seen today with focus on ADLs and mobility, pt hyperverbal and tangental during session today, difficulty to redirect and focus on current task. Pt min A for ADLs, supervision for bed mobility, and min guard for transfers with RW. Pt requires increased cuing due to keeping eyes closed during session, does not adhere well to safety cues during transfers. Pt presenting with impairments listed below, will follow acutely. Recommend d/c home with HHOT.   Recommendations for follow up therapy are one component of a multi-disciplinary discharge planning process, led by the attending physician.  Recommendations may be updated based on patient status, additional functional criteria and insurance authorization.    Follow Up Recommendations  Home health OT    Assistance Recommended at Discharge Intermittent Supervision/Assistance  Patient can return home with the following  A little help with walking and/or transfers;A little help with bathing/dressing/bathroom;Assistance with cooking/housework;Help with stairs or ramp for entrance;Assist for transportation   Equipment Recommendations  BSC/3in1    Recommendations for Other Services      Precautions / Restrictions Precautions Precautions: Fall Precaution Comments: rib fxs Restrictions Weight Bearing Restrictions: No       Mobility Bed Mobility Overal bed mobility: Needs Assistance Bed Mobility: Sit to Supine     Supine to sit: Supervision, HOB elevated     General bed mobility comments:  able to pull self up toward [facility]    Transfers Overall transfer level: Needs assistance Equipment used: Rolling walker (2 wheels) Transfers: Sit to/from Stand Sit to Stand: Min guard           General transfer comment: pt does not follow cues for hand placement/safety with RW, able to stand with own technique without LOB and therapist holding RW     Balance Overall balance assessment: Needs assistance Sitting-balance support: Bilateral upper extremity supported, Feet supported Sitting balance-Leahy Scale: Fair     Standing balance support: During functional activity, Reliant on assistive device for balance Standing balance-Leahy Scale: Fair Standing balance comment: statically stand without RW                           ADL either performed or assessed with clinical judgement   ADL Overall ADL's : Needs assistance/impaired     Grooming: Set up                   Toilet Transfer: Minimal assistance;Rolling walker (2 wheels) Toilet Transfer Details (indicate cue type and reason): directional cues needed due to eyes closed Toileting- Clothing Manipulation and Hygiene: Minimal assistance Toileting - Clothing Manipulation Details (indicate cue type and reason): able to manage clothing prior to sitting on commode     Functional mobility during ADLs: Minimal assistance;Rolling walker (2 wheels)      Extremity/Trunk Assessment Upper Extremity Assessment Upper Extremity Assessment: Overall WFL for tasks assessed   Lower Extremity Assessment Lower Extremity Assessment: Defer to PT evaluation        Vision   Additional Comments:  eyes closed frequently during session   Perception Perception Perception: Not tested   Praxis Praxis Praxis: Not tested    Cognition Arousal/Alertness: Awake/alert Behavior During Therapy: Restless Overall Cognitive Status: No family/caregiver present to determine baseline cognitive functioning                                  General Comments: tangential, internally distracted, not receptive to cues or direction, demonstrates lack of safety awareness        Exercises      Shoulder Instructions       General Comments pt hyperverbal during session, multiple cues to redirect    Pertinent Vitals/ Pain       Pain Assessment Pain Assessment: Faces Pain Score: 3  Faces Pain Scale: Hurts little more Pain Location: chest with movement Pain Descriptors / Indicators: Guarding, Discomfort Pain Intervention(s): Limited activity within patient's tolerance, Premedicated before session, Monitored during session, Repositioned  Home Living                                          Prior Functioning/Environment              Frequency  Min 2X/week        Progress Toward Goals  OT Goals(current goals can now be found in the care plan section)  Progress towards OT goals: Progressing toward goals  Acute Rehab OT Goals Patient Stated Goal: none stated OT Goal Formulation: With patient Time For Goal Achievement: 12/28/21 Potential to Achieve Goals: Good ADL Goals Pt Will Perform Lower Body Bathing: with modified independence;sit to/from stand;with adaptive equipment Pt Will Perform Lower Body Dressing: with modified independence;sit to/from stand;with adaptive equipment Pt Will Transfer to Toilet: with modified independence;ambulating Pt Will Perform Toileting - Clothing Manipulation and hygiene: with modified independence Additional ADL Goal #1: Pt will independently verblaize 3 strategies to reduce risk of falls  Plan Discharge plan remains appropriate;Frequency remains appropriate    Co-evaluation                 AM-PAC OT "6 Clicks" Daily Activity     Outcome Measure   Help from another person eating meals?: None Help from another person taking care of personal grooming?: A Little Help from another person toileting, which includes using toliet, bedpan,  or urinal?: A Little Help from another person bathing (including washing, rinsing, drying)?: A Lot Help from another person to put on and taking off regular upper body clothing?: A Lot Help from another person to put on and taking off regular lower body clothing?: A Lot 6 Click Score: 16    End of Session Equipment Utilized During Treatment: Rolling walker (2 wheels)  OT Visit Diagnosis: Unsteadiness on feet (R26.81);Muscle weakness (generalized) (M62.81);Other abnormalities of gait and mobility (R26.89);Repeated falls (R29.6);Pain   Activity Tolerance Patient tolerated treatment well   Patient Left in chair;with call bell/phone within reach;with chair alarm set   Nurse Communication Mobility status        Time: 4098-1191 OT Time Calculation (min): 56 min  Charges: OT General Charges $OT Visit: 1 Visit OT Treatments $Self Care/Home Management : 53-67 mins  Alfonzo Beers, OTD, OTR/L Acute Rehab [phone]) [phone]   Mayer Masker 12/15/2021, 1:36 PM
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
## 2021-12-16 ENCOUNTER — Telehealth: Payer: Self-pay | Admitting: *Deleted

## 2021-12-16 NOTE — Telephone Encounter (Signed)
Transition Care Management Follow-up Telephone Call Date of discharge and from where: 12/15/2021 [place] How have you been since you were released from the hospital? A lot better Any questions or concerns? No  Items Reviewed: Did the pt receive and understand the discharge instructions provided? Yes  Medications obtained and verified? Yes  Other? No  Any new allergies since your discharge? No  Dietary orders reviewed? Yes Do you have support at home? Yes   Home Care and Equipment/Supplies: Were home health services ordered? yes If so, what is the name of the agency? [facility]  Has the agency set up a time to come to the patient's home? yes Were any new equipment or medical supplies ordered?  No What is the name of the medical supply agency? [facility] Were you able to get the supplies/equipment? not applicable Do you have any questions related to the use of the equipment or supplies? No  Functional Questionnaire: (I = Independent and D = Dependent) ADLs: I with assistance   Bathing/Dressing- I  Meal Prep- D  Eating- I  Maintaining continence- I  Transferring/Ambulation- I with Assistance  Managing Meds- I  Follow up appointments reviewed:  PCP Hospital f/u appt confirmed? Yes  Scheduled to see Dinah on 12/18/2021 @ 1:00. Specialist Hospital f/u appt confirmed? No  Scheduled  Are transportation arrangements needed? No  If their condition worsens, is the pt aware to call PCP or go to the Emergency Dept.? Yes Was the patient provided with contact information for the PCP's office or ED? Yes Was to pt encouraged to call back with questions or concerns? Yes

## 2021-12-18 ENCOUNTER — Ambulatory Visit (INDEPENDENT_AMBULATORY_CARE_PROVIDER_SITE_OTHER): Payer: Medicare HMO | Admitting: Family

## 2021-12-18 ENCOUNTER — Encounter: Payer: Self-pay | Admitting: Family

## 2021-12-18 ENCOUNTER — Other Ambulatory Visit: Payer: Self-pay

## 2021-12-18 VITALS — BP 142/82 | HR 93 | Temp 97.3°F | Resp 16 | Ht 68.0 in | Wt 207.2 lb

## 2021-12-18 DIAGNOSIS — M25561 Pain in right knee: Secondary | ICD-10-CM

## 2021-12-18 DIAGNOSIS — I739 Peripheral vascular disease, unspecified: Secondary | ICD-10-CM | POA: Diagnosis not present

## 2021-12-18 DIAGNOSIS — N183 Chronic kidney disease, stage 3 unspecified: Secondary | ICD-10-CM | POA: Diagnosis not present

## 2021-12-18 DIAGNOSIS — Z1159 Encounter for screening for other viral diseases: Secondary | ICD-10-CM

## 2021-12-18 DIAGNOSIS — I5042 Chronic combined systolic (congestive) and diastolic (congestive) heart failure: Secondary | ICD-10-CM | POA: Diagnosis not present

## 2021-12-18 DIAGNOSIS — F321 Major depressive disorder, single episode, moderate: Secondary | ICD-10-CM

## 2021-12-18 DIAGNOSIS — Z794 Long term (current) use of insulin: Secondary | ICD-10-CM

## 2021-12-18 DIAGNOSIS — E1122 Type 2 diabetes mellitus with diabetic chronic kidney disease: Secondary | ICD-10-CM

## 2021-12-18 DIAGNOSIS — I5032 Chronic diastolic (congestive) heart failure: Secondary | ICD-10-CM

## 2021-12-18 DIAGNOSIS — I130 Hypertensive heart and chronic kidney disease with heart failure and stage 1 through stage 4 chronic kidney disease, or unspecified chronic kidney disease: Secondary | ICD-10-CM | POA: Diagnosis not present

## 2021-12-18 DIAGNOSIS — I480 Paroxysmal atrial fibrillation: Secondary | ICD-10-CM | POA: Diagnosis not present

## 2021-12-18 DIAGNOSIS — N1832 Chronic kidney disease, stage 3b: Secondary | ICD-10-CM

## 2021-12-18 DIAGNOSIS — G8929 Other chronic pain: Secondary | ICD-10-CM

## 2021-12-18 LAB — CBC WITH DIFFERENTIAL/PLATELET
Absolute Monocytes: 621 cells/uL (ref 200–950)
Basophils Absolute: 48 cells/uL (ref 0–200)
Basophils Relative: 0.7 %
Eosinophils Absolute: 228 cells/uL (ref 15–500)
Eosinophils Relative: 3.3 %
HCT: 39.6 % (ref 38.5–50.0)
Hemoglobin: 12.8 g/dL — ABNORMAL LOW (ref 13.2–17.1)
Lymphs Abs: 1056 cells/uL (ref 850–3900)
MCH: 28 pg (ref 27.0–33.0)
MCHC: 32.3 g/dL (ref 32.0–36.0)
MCV: 86.7 fL (ref 80.0–100.0)
MPV: 10.9 fL (ref 7.5–12.5)
Monocytes Relative: 9 %
Neutro Abs: 4947 cells/uL (ref 1500–7800)
Neutrophils Relative %: 71.7 %
Platelets: 247 10*3/uL (ref 140–400)
RBC: 4.57 10*6/uL (ref 4.20–5.80)
RDW: 15.2 % — ABNORMAL HIGH (ref 11.0–15.0)
Total Lymphocyte: 15.3 %
WBC: 6.9 10*3/uL (ref 3.8–10.8)

## 2021-12-18 LAB — BASIC METABOLIC PANEL WITH GFR
BUN/Creatinine Ratio: 21 (calc) (ref 6–22)
BUN: 36 mg/dL — ABNORMAL HIGH (ref 7–25)
CO2: 27 mmol/L (ref 20–32)
Calcium: 9 mg/dL (ref 8.6–10.3)
Chloride: 103 mmol/L (ref 98–110)
Creat: 1.69 mg/dL — ABNORMAL HIGH (ref 0.70–1.28)
Glucose, Bld: 125 mg/dL — ABNORMAL HIGH (ref 65–99)
Potassium: 5.1 mmol/L (ref 3.5–5.3)
Sodium: 137 mmol/L (ref 135–146)
eGFR: 43 mL/min/{1.73_m2} — ABNORMAL LOW (ref >=60)

## 2021-12-18 MED ORDER — FUROSEMIDE 20 MG PO TABS: 20.0000 mg | ORAL_TABLET | Freq: Two times a day (BID) | ORAL | 3 refills | Status: DC

## 2021-12-18 MED ORDER — SPIRONOLACTONE 25 MG PO TABS: 12.5000 mg | ORAL_TABLET | Freq: Every day | ORAL | 2 refills | Status: DC

## 2021-12-18 MED ORDER — VITAMIN D (ERGOCALCIFEROL) 1.25 MG (50000 UNIT) PO CAPS: ORAL_CAPSULE | ORAL | 1 refills | Status: DC

## 2021-12-18 MED ORDER — METHOCARBAMOL 500 MG PO TABS: 500.0000 mg | ORAL_TABLET | Freq: Three times a day (TID) | ORAL | 1 refills | Status: DC | PRN

## 2021-12-18 NOTE — Progress Notes (Signed)
Provider: [name] FNP-C  Raea Magallon, Nelda Bucks, NP  Patient Care Team: Larenz Frasier, Nelda Bucks, NP as PCP - General (Family Medicine) Bensimhon, Shaune Pascal, MD as PCP - Advanced Heart Failure (Cardiology) Troy Sine, MD as Consulting Physician (Cardiology)  Extended Emergency Contact Information Primary Emergency Contact: [place] Mobile Phone: [phone] Relation: Daughter Secondary Emergency Contact: Derrill Kay [place] Phone: [phone] Mobile Phone: [phone] Relation: Other  Code Status: Full Code  Goals of care: Advanced Directive information Advanced Directives 12/18/2021  Does Patient Have a Medical Advance Directive? No  Would patient like information on creating a medical advance directive? No - Patient declined  Pre-existing out of facility DNR order (yellow form or pink MOST form) -  Some encounter information is confidential and restricted. Go to Review Flowsheets activity to see all data.     Chief Complaint  Patient presents with   Transitions Of Care    [facility] 12/11/2021-12/15/2021    HPI:  Pt is a 71 y.o. 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 He is here with daughter who is the primary care giver.    He was getting up from sitting fell from standing position down 3 steps.No loss of consciousness and did not hit his head.He presented to ED after 2 days of fall with complains of pain in the chest ,lower back and right hip and inability to get out of the chair.pain in the chest worst with deep inspiration.   R 5-7 rib fracture with PTX - chest tube was placed  in the ED but accidentally pulled off after 2 days of placement overnight.chest X-ray done showed Pneumothorax resolved.No further intervention.Chest X-ray ordered to follow up outpatient.States feeling better just has to hold breath when rolling over in bed.  Right hip OA - pelvic X-ray indicated fracture,CT femur was negative.WBAT on RLE.   Afib - Cardiologist Dr.Bensimhon was consulted felt the risk of anticoagulant outweighed the benefit  therefore he was advised to stop coumadin but daughter and patient states restarted coumadin on their own when he was discharged.I've discussed with both to consult with Cardiologist in the meantime follow hospital discharge instruction.   Type 2 DM - No home CBG readings for review.denies any signs of hypoglycemia.Hgb A1C 6.8 ( 12/11/2021)  CKD stage 3 - his CR was elevated during hospitalization 2.02 but trended down to 1.86 baseline 1.5 - 1.6  Past Medical History:  Diagnosis Date   A-fib ([place])    a. (06/04/14) TEE-DC-CV; succesful; large LA 6.2 cm   ADHD    Ascites    Athlete's foot    Chronic systolic heart failure ([place])    a. 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, unspecified CKD stage, unspecified whether long term insulin use (HCC)    Heart murmur    HTN (hypertension)    OSA (obstructive sleep apnea)    PVD (peripheral vascular disease) (HCC)    s/p great toe amputation   Past Surgical History:  Procedure Laterality Date   ABDOMINAL AORTOGRAM W/LOWER EXTREMITY N/A 05/19/2021   Procedure: ABDOMINAL AORTOGRAM W/LOWER EXTREMITY;  Surgeon: Marty Heck, MD;  Location: [place] [facility];  Service: Cardiovascular;  Laterality: N/A;   AMPUTATION Left 05/22/2021   Procedure: LEFT FIFTH TOE AMPUTATION;  Surgeon: Marty Heck, MD;   Location: [place];  Service: Vascular;  Laterality: Left;   CARDIOVERSION N/A 06/04/2014   Procedure: CARDIOVERSION;  Surgeon: Larey Dresser, MD;  Location: [facility] ENDOSCOPY;  Service: Cardiovascular;  Laterality: N/A;   CARDIOVERSION N/A 11/06/2021   Procedure: CARDIOVERSION;  Surgeon: Larey Dresser, MD;  Location: [facility] ENDOSCOPY;  Service: Cardiovascular;  Laterality: N/A;   NOSE SURGERY     Nasal septum surgery   PERIPHERAL VASCULAR INTERVENTION Left 05/19/2021   Procedure: PERIPHERAL VASCULAR INTERVENTION;  Surgeon: Marty Heck, MD;  Location: [place] [facility];  Service: Cardiovascular;  Laterality: Left;   TEE WITHOUT CARDIOVERSION N/A 06/04/2014   Procedure: TRANSESOPHAGEAL ECHOCARDIOGRAM (TEE);  Surgeon: Larey Dresser, MD;  Location: [place];  Service: Cardiovascular;  Laterality: N/A;   TEE WITHOUT CARDIOVERSION N/A 01/06/2016   Procedure: TRANSESOPHAGEAL ECHOCARDIOGRAM (TEE);  Surgeon: Fay Records, MD;  Location: [place];  Service: Cardiovascular;  Laterality: N/A;   TEE WITHOUT CARDIOVERSION N/A 11/06/2021   Procedure: TRANSESOPHAGEAL ECHOCARDIOGRAM (TEE);  Surgeon: Larey Dresser, MD;  Location: [facility] ENDOSCOPY;  Service: Cardiovascular;  Laterality: N/A;   VASECTOMY      No Known Allergies  Outpatient Encounter Medications as of 12/18/2021  Medication Sig   acetaminophen (TYLENOL) 500 MG tablet Take 2 tablets (1,000 mg total) by mouth every 6 (six) hours as needed.   amiodarone (PACERONE) 200 MG tablet Take 1 tablet (200 mg total) by mouth daily.   blood glucose meter kit and supplies Dispense based on patient and insurance preference. Use up to four times daily as directed. (FOR ICD-10 E10.9, E11.9).   Cyanocobalamin (VITAMIN B-12 PO) Take 1 capsule by mouth daily.   diclofenac Sodium (VOLTAREN) 1 % GEL Apply topically 4 (four) times daily as needed.   empagliflozin (JARDIANCE) 10 MG TABS tablet Take 1 tablet (10 mg total) by mouth daily.   Green Tea, Camellia  sinensis, (GREEN TEA EXTRACT PO) Take 1 Dose by mouth daily.   insulin glargine (LANTUS) 100 UNIT/ML injection Inject 0.08 mLs (8 Units total) into the skin daily.   KRILL OIL OMEGA-3 PO Take 1 tablet by mouth daily.   polyethylene glycol (MIRALAX / GLYCOLAX) 17 g packet Take 17 g by mouth daily as needed.   potassium chloride (KLOR-CON) 10 MEQ tablet TAKE 2 TABLETS(20 MEQ) BY MOUTH TWICE DAILY   sacubitril-valsartan (ENTRESTO) 49-51 MG Take 1 tablet by mouth 2 (two) times daily.   Sodium Hyaluronate, oral, (HYALURONIC ACID PO) Take 1 tablet by mouth daily. Vit c, 120 mg ,magnesium,22m,   traMADol (ULTRAM) 50 MG tablet Take 1 tablet (50 mg total) by mouth every 6 (six) hours as needed for moderate pain.   warfarin (COUMADIN) 6 MG tablet Take 3 mg by mouth daily. Tuesday, Thursday, and Saturday.   warfarin (COUMADIN) 6 MG tablet Take 6 mg by mouth daily. Monday, Wednesday, Friday, and Sunday.   [DISCONTINUED] furosemide (LASIX) 20 MG tablet Take 1 tablet (20 mg total) by mouth 2 (two) times daily. May take an additional 236mdaily as needed.   [DISCONTINUED] methocarbamol (ROBAXIN) 500 MG tablet Take 1 tablet (500 mg total) by mouth every 8 (eight) hours as needed for muscle  spasms.   [DISCONTINUED] spironolactone (ALDACTONE) 25 MG tablet Take 0.5 tablet (12.5 mg total) by mouth daily.   [DISCONTINUED] Vitamin D, Ergocalciferol, (DRISDOL) 1.25 MG (50000 UNIT) CAPS capsule TAKE 1 CAPSULE BY MOUTH EVERY THURSDAY   atorvastatin (LIPITOR) 10 MG tablet TAKE 1 TABLET(10 MG) BY MOUTH DAILY (Patient not taking: Reported on 12/11/2021)   furosemide (LASIX) 20 MG tablet Take 1 tablet (20 mg total) by mouth 2 (two) times daily. May take an additional 39m daily as needed.   methocarbamol (ROBAXIN) 500 MG tablet Take 1 tablet (500 mg total) by mouth every 8 (eight) hours as needed for muscle spasms.   spironolactone (ALDACTONE) 25 MG tablet Take 0.5 tablet (12.5 mg total) by mouth daily.   Vitamin D,  Ergocalciferol, (DRISDOL) 1.25 MG (50000 UNIT) CAPS capsule TAKE 1 CAPSULE BY MOUTH EVERY THURSDAY   [DISCONTINUED] diclofenac Sodium (VOLTAREN) 1 % GEL Apply 2 g topically 4 (four) times daily. (Patient taking differently: Apply 2 g topically 4 (four) times daily as needed (arthritis pain).)   [DISCONTINUED] polyethylene glycol powder (GLYCOLAX/MIRALAX) 17 GM/SCOOP powder Take 17 g by mouth daily. Hold for loose stool (Patient taking differently: Take 17 g by mouth daily as needed for mild constipation. Hold for loose stool)   No facility-administered encounter medications on file as of 12/18/2021.    Review of Systems  Constitutional:  Negative for appetite change, chills, fatigue, fever and unexpected weight change.  HENT:  Negative for congestion, dental problem, ear discharge, ear pain, facial swelling, hearing loss, nosebleeds, postnasal drip, rhinorrhea, sinus pressure, sinus pain, sneezing, sore throat, tinnitus and trouble swallowing.   Eyes:  Negative for pain, discharge, redness, itching and visual disturbance.  Respiratory:  Negative for cough, chest tightness, shortness of breath and wheezing.   Cardiovascular:  Negative for chest pain, palpitations and leg swelling.  Gastrointestinal:  Negative for abdominal distention, abdominal pain, blood in stool, constipation, diarrhea, nausea and vomiting.  Endocrine: Negative for cold intolerance, heat intolerance, polydipsia, polyphagia and polyuria.  Genitourinary:  Negative for difficulty urinating, dysuria, flank pain, frequency and urgency.  Musculoskeletal:  Positive for arthralgias and gait problem. Negative for back pain, joint swelling, myalgias, neck pain and neck stiffness.  Skin:  Negative for color change, pallor, rash and wound.       Right side chest tube site dressing  Neurological:  Negative for dizziness, syncope, speech difficulty, weakness, light-headedness, numbness and headaches.  Hematological:  Does not bruise/bleed  easily.  Psychiatric/Behavioral:  Negative for agitation, behavioral problems, confusion, hallucinations, self-injury, sleep disturbance and suicidal ideas. The patient is not nervous/anxious.    Immunization History  Administered Date(s) Administered   Tdap 05/01/2016   Pertinent  Health Maintenance Due  Topic Date Due   FOOT EXAM  Never done   OPHTHALMOLOGY EXAM  Never done   INFLUENZA VACCINE  Never done   HEMOGLOBIN A1C  06/10/2022   COLONOSCOPY (Pts [id number] coverage will need to be confirmed)  Discontinued   Fall Risk 12/13/2021 12/13/2021 12/14/2021 12/15/2021 12/18/2021  Falls in the past year? - - - - 1  Was there an injury with Fall? - - - - 1  Fall Risk Category Calculator - - - - 3  Fall Risk Category - - - - High  Patient Fall Risk Level High fall risk High fall risk High fall risk High fall risk High fall risk  Patient at Risk for Falls Due to - - - - History of fall(s)  Fall risk Follow up - - - -  Falls evaluation completed;Education provided;Falls prevention discussed   Functional Status Survey:    Vitals:   12/18/21 1340  BP: (!) 142/82  Pulse: 93  Resp: 16  Temp: (!) 97.3 F (36.3 C)  SpO2: 97%  Weight: 207 lb 3.2 oz (94 kg)  Height: '5\' 8"'  (1.727 m)   Body mass index is 31.5 kg/m. Physical Exam Vitals reviewed.  Constitutional:      General: He is not in acute distress.    Appearance: Normal appearance. He is obese. He is not ill-appearing or diaphoretic.  HENT:     Head: Normocephalic.     Nose: No rhinorrhea.  Eyes:     General: No scleral icterus.       Right eye: No discharge.        Left eye: No discharge.     Conjunctiva/sclera: Conjunctivae normal.     Comments: Sits in exam room with eyes closed through out the visit   Neck:     Vascular: No carotid bruit.  Cardiovascular:     Rate and Rhythm: Normal rate and regular rhythm.     Pulses: Normal pulses.     Heart sounds: Normal heart sounds. No murmur heard.   No friction rub. No  gallop.  Pulmonary:     Effort: Pulmonary effort is normal. No respiratory distress.     Breath sounds: Normal breath sounds. No wheezing, rhonchi or rales.  Chest:     Chest wall: No tenderness.  Abdominal:     General: Bowel sounds are normal. There is no distension.     Palpations: Abdomen is soft. There is no mass.     Tenderness: There is no abdominal tenderness. There is no right CVA tenderness, left CVA tenderness, guarding or rebound.  Musculoskeletal:        General: No swelling or tenderness. Normal range of motion.     Cervical back: Normal range of motion. No rigidity or tenderness.     Right lower leg: No edema.     Left lower leg: No edema.     Left Lower Extremity: (left 5 th toe amputee) Lymphadenopathy:     Cervical: No cervical adenopathy.  Skin:    General: Skin is warm and dry.     Coloration: Skin is not pale.     Findings: No erythema, lesion or rash.     Comments: Right hip dark purplish bruise from fall . Right upper side chest wall chest site with dry dressing.dressing removed site with dry scab and dry blood noted.cleansed with saline ,pat dry and covered with foam dressing for protection.  Neurological:     Mental Status: He is alert and oriented to person, place, and time.     Cranial Nerves: No cranial nerve deficit.     Sensory: No sensory deficit.     Motor: No weakness.     Coordination: Coordination normal.     Gait: Gait abnormal.  Psychiatric:        Mood and Affect: Mood normal.        Speech: Speech normal.        Behavior: Behavior normal.        Thought Content: Thought content normal.        Judgment: Judgment normal.     Comments: Easily irritable and makes sarcastic jokes.    Labs reviewed: Recent Labs    10/28/21 2112 10/30/21 0248 10/31/21 0250 11/01/21 0440 12/11/21 0851 12/11/21 0901 12/12/21 0151 12/13/21 0343  NA 140   < >  138   < > 136 138 138 136  K 4.3   < > 3.7   < > 4.7 4.6 4.4 4.7  CL 106   < > 100   < > 102  103 104 104  CO2 28   < > 32   < > 22  --  26 22  GLUCOSE 206*   < > 130*   < > 169* 165* 127* 136*  BUN 28*   < > 22   < > 39* 39* 36* 29*  CREATININE 1.50*   < > 1.60*   < > 2.02* 2.00* 1.86* 1.73*  CALCIUM 9.0   < > 8.6*   < > 9.1  --  8.4* 8.4*  MG 2.4  --  2.3  --   --   --   --   --    < > = values in this interval not displayed.   Recent Labs    05/15/21 1853 06/13/21 0941 10/28/21 2112 12/11/21 0851  AST '16 19 23 30  ' ALT 23 38 35 42  ALKPHOS 42  --  41 32*  BILITOT 1.1 0.8 1.1 1.0  PROT 7.9 7.3 7.3 8.0  ALBUMIN 3.9  --  3.6 3.9   Recent Labs    05/15/21 1853 05/16/21 0310 06/13/21 0941 10/28/21 2112 10/30/21 0248 11/10/21 0147 12/11/21 0851 12/11/21 0901 12/12/21 0151  WBC 10.7*   < > 8.1 6.2   < > 7.6 12.3*  --  8.5  NEUTROABS 7.8*  --  5,881 4.4  --   --   --   --   --   HGB 13.2   < > 14.7 11.6*   < > 13.2 13.8 15.6 12.1*  HCT 40.2   < > 44.8 39.3   < > 42.6 44.6 46.0 37.6*  MCV 89.1   < > 88.7 94.9   < > 89.7 89.2  --  88.7  PLT 211   < > 261 166   < > 234 186  --  157   < > = values in this interval not displayed.   Lab Results  Component Value Date   TSH 2.01 06/13/2021   Lab Results  Component Value Date   HGBA1C 6.8 (H) 12/11/2021   Lab Results  Component Value Date   CHOL 140 06/13/2021   HDL 33 (L) 06/13/2021   LDLCALC 80 06/13/2021   TRIG 176 (H) 06/13/2021   CHOLHDL 4.2 06/13/2021    Significant Diagnostic Results in last 30 days:  DG Pelvis 1-2 Views  Result Date: 12/11/2021 CLINICAL DATA:  Trauma right back pain after fall 2 days ago. EXAM: PELVIS - 1-2 VIEW COMPARISON:  None. FINDINGS: Irregularity of the femoral neck with superior displacement of the femur concerning for subcapital femoral neck fracture. Diffuse osteopenia. Advanced right hip osteoarthritis. Multilevel degenerative disc disease of the lumbar spine. IMPRESSION: Right subcapital femoral neck fracture. Electronically Signed   By: Keane Police D.O.   On: 12/11/2021 10:17    CT HEAD WO CONTRAST (5MM)  Result Date: 12/11/2021 CLINICAL DATA:  Patient fell 2 days ago.  Pain all over. EXAM: CT HEAD WITHOUT CONTRAST CT CERVICAL SPINE WITHOUT CONTRAST TECHNIQUE: Multidetector CT imaging of the head and cervical spine was performed following the standard protocol without intravenous contrast. Multiplanar CT image reconstructions of the cervical spine were also generated. RADIATION DOSE REDUCTION: This exam was performed according to the departmental dose-optimization program which includes automated exposure control, adjustment  of the mA and/or kV according to patient size and/or use of iterative reconstruction technique. COMPARISON:  No comparison studies available. FINDINGS: CT HEAD FINDINGS Brain: There is no evidence for acute hemorrhage, hydrocephalus, mass lesion, or abnormal extra-axial fluid collection. No definite CT evidence for acute infarction. Tiny lacunar infarct noted right cerebellum Vascular: No hyperdense vessel or unexpected calcification. Skull: No evidence for fracture. No worrisome lytic or sclerotic lesion. Sinuses/Orbits: The visualized paranasal sinuses and mastoid air cells are clear. Visualized portions of the globes and intraorbital fat are unremarkable. Other: None. CT CERVICAL SPINE FINDINGS Alignment: Normal. Skull base and vertebrae: No acute fracture. No primary bone lesion or focal pathologic process. Soft tissues and spinal canal: Soft tissue gas is noted in the right lower neck and supraclavicular region. Disc levels: Marked loss of disc height noted C6-7 into a lesser degree at C7-T1. Endplate spurring noted at both levels. There is diffuse bilateral facet osteoarthritis with fusion of the C2-3 facets on the left. Upper chest: Tiny right apical pneumothorax evident. Other: None. IMPRESSION: 1. No acute intracranial abnormality. 2. No evidence for cervical spine fracture or subluxation. 3. Soft tissue gas in the right lower neck and supraclavicular  region is associated with a tiny anterior pneumothorax identified in the visualized portion of the right lung apex. Patient had a CT chest performed at the same time as this exam. Please see that report for full details. Electronically Signed   By: Misty Stanley M.D.   On: 12/11/2021 10:34   CT Cervical Spine Wo Contrast  Result Date: 12/11/2021 CLINICAL DATA:  Patient fell 2 days ago.  Pain all over. EXAM: CT HEAD WITHOUT CONTRAST CT CERVICAL SPINE WITHOUT CONTRAST TECHNIQUE: Multidetector CT imaging of the head and cervical spine was performed following the standard protocol without intravenous contrast. Multiplanar CT image reconstructions of the cervical spine were also generated. RADIATION DOSE REDUCTION: This exam was performed according to the departmental dose-optimization program which includes automated exposure control, adjustment of the mA and/or kV according to patient size and/or use of iterative reconstruction technique. COMPARISON:  No comparison studies available. FINDINGS: CT HEAD FINDINGS Brain: There is no evidence for acute hemorrhage, hydrocephalus, mass lesion, or abnormal extra-axial fluid collection. No definite CT evidence for acute infarction. Tiny lacunar infarct noted right cerebellum Vascular: No hyperdense vessel or unexpected calcification. Skull: No evidence for fracture. No worrisome lytic or sclerotic lesion. Sinuses/Orbits: The visualized paranasal sinuses and mastoid air cells are clear. Visualized portions of the globes and intraorbital fat are unremarkable. Other: None. CT CERVICAL SPINE FINDINGS Alignment: Normal. Skull base and vertebrae: No acute fracture. No primary bone lesion or focal pathologic process. Soft tissues and spinal canal: Soft tissue gas is noted in the right lower neck and supraclavicular region. Disc levels: Marked loss of disc height noted C6-7 into a lesser degree at C7-T1. Endplate spurring noted at both levels. There is diffuse bilateral facet  osteoarthritis with fusion of the C2-3 facets on the left. Upper chest: Tiny right apical pneumothorax evident. Other: None. IMPRESSION: 1. No acute intracranial abnormality. 2. No evidence for cervical spine fracture or subluxation. 3. Soft tissue gas in the right lower neck and supraclavicular region is associated with a tiny anterior pneumothorax identified in the visualized portion of the right lung apex. Patient had a CT chest performed at the same time as this exam. Please see that report for full details. Electronically Signed   By: Misty Stanley M.D.   On: 12/11/2021 10:34  CT CHEST ABDOMEN PELVIS W CONTRAST  Result Date: 12/11/2021 CLINICAL DATA:  Chest and abdominal pain after fall 2 days ago EXAM: CT CHEST, ABDOMEN, AND PELVIS WITH CONTRAST TECHNIQUE: Multidetector CT imaging of the chest, abdomen and pelvis was performed following the standard protocol during bolus administration of intravenous contrast. RADIATION DOSE REDUCTION: This exam was performed according to the departmental dose-optimization program which includes automated exposure control, adjustment of the mA and/or kV according to patient size and/or use of iterative reconstruction technique. CONTRAST:  36m OMNIPAQUE IOHEXOL 300 MG/ML  SOLN COMPARISON:  Radiograph of same day.  CT of May 09, 2014. FINDINGS: CT CHEST FINDINGS Cardiovascular: Atherosclerosis of thoracic aorta is noted without aneurysm or dissection. Normal cardiac size. No pericardial effusion. Coronary artery calcifications are noted. Mediastinum/Nodes: No enlarged mediastinal, hilar, or axillary lymph nodes. Thyroid gland, trachea, and esophagus demonstrate no significant findings. Lungs/Pleura: Small right anterior pneumothorax is noted on the order of 15-20%. No pleural effusion is noted. Bilateral lower lobe atelectasis is noted. Musculoskeletal: Mildly displaced right fifth, sixth and seventh rib fractures are noted. Extensive subcutaneous emphysema is seen in the  right lateral chest wall. CT ABDOMEN PELVIS FINDINGS Hepatobiliary: No focal liver abnormality is seen. No gallstones, gallbladder wall thickening, or biliary dilatation. Pancreas: Unremarkable. No pancreatic ductal dilatation or surrounding inflammatory changes. Spleen: Normal in size without focal abnormality. Adrenals/Urinary Tract: Adrenal glands are unremarkable. Kidneys are normal, without renal calculi, focal lesion, or hydronephrosis. Bladder is unremarkable. Stomach/Bowel: Stomach is within normal limits. Appendix appears normal. No evidence of bowel wall thickening, distention, or inflammatory changes. Vascular/Lymphatic: Aortic atherosclerosis. No enlarged abdominal or pelvic lymph nodes. Reproductive: Mild prostatic enlargement is noted. Other: No abdominal wall hernia or abnormality. No abdominopelvic ascites. Musculoskeletal: Severe degenerative changes seen involving the right hip joint. No acute osseous abnormality is noted. IMPRESSION: Small right anterior and basilar pneumothorax is noted on the order of 15-20%. This pneumothorax was described on radiograph of same day. Mildly displaced fractures are seen involving the lateral portions of the right fifth, sixth and seventh ribs. Extensive subcutaneous emphysema is seen involving the right chest wall. Bibasilar atelectasis is noted, right greater than left. No traumatic abnormality seen in the abdomen or pelvis. Mild prostatic enlargement is noted. Severe degenerative joint disease of right hip. Aortic Atherosclerosis (ICD10-I70.0). Electronically Signed   By: JMarijo ConceptionM.D.   On: 12/11/2021 10:44   DG CHEST PORT 1 VIEW  Result Date: 12/14/2021 CLINICAL DATA:  Pain. Emphysema. Back evaluate for pneumothorax after chest tube removal EXAM: PORTABLE CHEST 1 VIEW COMPARISON:  12/13/2021 FINDINGS: Stable cardiomediastinal contours. No significant pneumothorax identified. Lung volumes are low. Atelectasis in the left base and right midlung  appears unchange. Right lateral rib fracture deformities are again noted. Similar amount of subcutaneous gas along the right chest wall. IMPRESSION: 1. No significant pneumothorax identified. 2. No change in bilateral areas of subsegmental atelectasis, right greater than left. 3. Right rib fractures. Electronically Signed   By: TKerby MoorsM.D.   On: 12/14/2021 08:40   DG CHEST PORT 1 VIEW  Result Date: 12/13/2021 CLINICAL DATA:  Pneumothorax.  Status post chest tube removal. EXAM: PORTABLE CHEST 1 VIEW COMPARISON:  Chest x-ray from earlier same day. Chest x-ray dated 12/12/2021. FINDINGS: Questionable tiny residual pneumothorax at the outer margin of the RIGHT minor fissure. Stable atelectasis at the bilateral lung bases. No new lung findings. Heart size and mediastinal contours are stable. Stable subcutaneous emphysema along the RIGHT lateral chest wall.  IMPRESSION: 1. Questionable tiny residual pneumothorax at the outer margin of the RIGHT minor fissure. Stable subcutaneous emphysema along the RIGHT lateral chest wall. 2. Stable atelectasis at the bilateral lung bases. Electronically Signed   By: Franki Cabot M.D.   On: 12/13/2021 14:58   DG Chest Port 1 View  Result Date: 12/13/2021 CLINICAL DATA:  Chest tube EXAM: PORTABLE CHEST 1 VIEW COMPARISON:  01/09/2022. FINDINGS: The heart size and mediastinal contours are stable. The right-sided chest tube is no longer seen. There is a small residual pneumothorax along the lateral lung border and air is also present in the minor fissure. Lung volumes are low and atelectasis is noted at the lung bases bilaterally. No pleural effusion. Stable rib fractures are noted on the right. IMPRESSION: 1. Right chest tube is no longer visualized on exam and there is a small residual pneumothorax. 2. Low lung volumes with atelectasis at the lung bases. 3. Stable rib fractures on the right. Electronically Signed   By: Brett Fairy M.D.   On: 12/13/2021 04:14   DG Chest  Port 1 View  Result Date: 12/12/2021 CLINICAL DATA:  Encounter for right pneumothorax. EXAM: PORTABLE CHEST 1 VIEW COMPARISON:  February 2, 23. FINDINGS: Right chest tube in place. No visible pneumothorax on this semi erect radiograph. Subcutaneous emphysema along the right chest wall. Low lung volumes. Similar bibasilar opacities. No definite pleural effusion. Similar mild enlargement the cardiac silhouette. Redemonstrated right rib fractures, better characterized on prior CT. IMPRESSION: 1. Right chest tube in place without visible pneumothorax. 2. Similar low lung volumes and bibasilar opacities, most likely atelectasis. Electronically Signed   By: Margaretha Sheffield M.D.   On: 12/12/2021 08:10   DG Chest Portable 1 View  Result Date: 12/11/2021 CLINICAL DATA:  Chest tube insertion. EXAM: PORTABLE CHEST 1 VIEW COMPARISON:  CT chest 12/11/2021, AP chest 12/11/2021 chest two views 10/28/2021 FINDINGS: New small pigtail catheter with tip overlying the medial right hemithorax. Interval decrease and near resolution of the prior tiny right apical pneumothorax. Medial right lower lung greater than left lower lung linear subsegmental atelectasis. No definite pleural effusion. Cardiac silhouette again appears mildly enlarged. Mediastinal contours are grossly within normal limits. Lateral right sixth rib mildly displaced fracture is again noted. The additional right fifth and seventh rib fractures seen on prior CT are less well visualized. Right lateral chest wall subcutaneous air is again seen. IMPRESSION:: IMPRESSION: 1. Interval placement of right-sided chest tube. Interval decrease and near resolution of the prior tiny right apical pneumothorax. 2. Right lateral acute rib fractures are again noted on radiographs and prior CT. Electronically Signed   By: Yvonne Kendall M.D.   On: 12/11/2021 13:12   DG Chest Port 1 View  Result Date: 12/11/2021 CLINICAL DATA:  Status post fall.  Right lower anterior rib pain. EXAM:  PORTABLE CHEST 1 VIEW COMPARISON:  10/28/2021 FINDINGS: Right infrahilar airspace disease likely reflecting atelectasis versus scarring. Left basilar atelectasis. Small right apical pneumothorax measuring less than 10%. No left pneumothorax. No pleural effusion. Stable cardiomediastinal silhouette. Soft tissue emphysema along the right lateral chest wall a and right side of the neck. Right lateral sixth rib fracture partially visualized. IMPRESSION: 1. Small right apical pneumothorax measuring less than 10%. 2. Right lateral rib fracture partially visualized. Critical Value/emergent results were called by telephone at the time of interpretation on 12/11/2021 at 8:54 am to provider [facility] , who verbally acknowledged these results. Electronically Signed   By: [place] Service.D.  On: 12/11/2021 08:55   DG FEMUR, MIN 2 VIEWS RIGHT  Result Date: 12/11/2021 CLINICAL DATA:  Right leg immobility after fall 2 days ago. EXAM: RIGHT FEMUR 2 VIEWS COMPARISON:  Right hip x-rays dated May 23, 2021. Right knee x-rays dated May 21, 2021. FINDINGS: No acute fracture or dislocation. Unchanged severe right hip and mild-to-moderate right knee osteoarthritis. IMPRESSION: 1. No acute osseous abnormality. 2. Unchanged severe right hip osteoarthritis. Electronically Signed   By: Titus Dubin M.D.   On: 12/11/2021 10:11    Assessment/Plan 1. Type 2 diabetes mellitus with stage 3b chronic kidney disease, with long-term current use of insulin (HCC) Lab Results  Component Value Date   HGBA1C 6.8 (H) 12/11/2021  No home CBG readings for evaluation  Continue on Lantus 8 units and Jardiance  - continue to follow up with Ophthalmology  Referral ordered previous for podiatrist but unclear why he did not follow up.will place another referral today. - on Enteresto  - Ambulatory referral to Podiatry  2. Hypertensive heart and kidney disease with chronic diastolic congestive heart failure and stage 3 chronic kidney disease,  unspecified whether stage 3a or 3b CKD (HCC) B/p stable  - continue on Enteresto,Spironolactone and Furosemide  - CBC with Differential/Platelet - BMP with eGFR([facility])  3. Chronic pain of right knee States Hyaluronic acid effective  - continue on Tramadol and Tylenol  - Fall and safety precaution advised.  4. Chronic combined systolic and diastolic congestive heart failure (HCC) No signs of fluid overload  - continue on spironolactone and Furosemide   5. PAF (paroxysmal atrial fibrillation) (HCC) Continue on Amiodarone  - coumadin discontinued on hospital discharge but daughter and patient restarted on their own Advised to follow up with Cardiologist to discuss coumadin due to his high risk for falls.states INR managed by coumadin clinic.    6. Current moderate episode of major depressive disorder, unspecified whether recurrent ([place]) Mood stable but gets easily irritable and makes sarcastic jokes.  Not on antidepressant  Continue to monitor   8. PVD (peripheral vascular disease) (HCC) No ulceration noted. Not taking atorvastatin per daughter legs get weak.   Family/ staff Communication: Reviewed plan of care with patient and daughter verbalized understanding   Labs/tests ordered:  - CBC with Differential/Platelet - BMP with eGFR([facility])  Next Appointment: 6 months for medical management of chronic issues.Fasting Labs prior to visit.    Sandrea Hughs, NP

## 2021-12-20 ENCOUNTER — Other Ambulatory Visit (HOSPITAL_COMMUNITY): Payer: Self-pay | Admitting: Adult Health

## 2021-12-24 ENCOUNTER — Ambulatory Visit (INDEPENDENT_AMBULATORY_CARE_PROVIDER_SITE_OTHER): Payer: Medicare HMO

## 2021-12-24 ENCOUNTER — Other Ambulatory Visit: Payer: Self-pay

## 2021-12-24 DIAGNOSIS — I4891 Unspecified atrial fibrillation: Secondary | ICD-10-CM

## 2021-12-24 DIAGNOSIS — Z5181 Encounter for therapeutic drug level monitoring: Secondary | ICD-10-CM | POA: Diagnosis not present

## 2021-12-24 LAB — POCT INR: INR: 1.2 — AB (ref 2.0–3.0)

## 2021-12-24 NOTE — Patient Instructions (Addendum)
Description   Continue to hold Warfarin as instructed by providers at discharge from recent hospitalization and follow-up with Dr Gala Romney on 12/26/21.  Normal Dose: Warfarin 1 tablet daily except 1/2 tablet on Tuesdays, Thursdays and Saturdays.  Recheck INR in 2 weeks if Dr Gala Romney restarts Warfarin after visit on 12/26/21.  Main # [phone] [facility] [phone]. Amio increased to 200mg  daily

## 2021-12-26 ENCOUNTER — Encounter (HOSPITAL_COMMUNITY): Payer: Self-pay | Admitting: Internal Medicine

## 2021-12-26 ENCOUNTER — Ambulatory Visit (HOSPITAL_COMMUNITY)
Admission: RE | Admit: 2021-12-26 | Discharge: 2021-12-26 | Disposition: A | Discharge status: Home / Self Care | Payer: Medicare HMO | Source: Ambulatory Visit | Attending: Internal Medicine | Admitting: Internal Medicine

## 2021-12-26 ENCOUNTER — Other Ambulatory Visit: Payer: Self-pay

## 2021-12-26 VITALS — BP 110/70 | HR 70 | Wt 212.2 lb

## 2021-12-26 DIAGNOSIS — I480 Paroxysmal atrial fibrillation: Secondary | ICD-10-CM | POA: Diagnosis not present

## 2021-12-26 DIAGNOSIS — Z79899 Other long term (current) drug therapy: Secondary | ICD-10-CM | POA: Diagnosis not present

## 2021-12-26 DIAGNOSIS — I513 Intracardiac thrombosis, not elsewhere classified: Secondary | ICD-10-CM

## 2021-12-26 DIAGNOSIS — I130 Hypertensive heart and chronic kidney disease with heart failure and stage 1 through stage 4 chronic kidney disease, or unspecified chronic kidney disease: Secondary | ICD-10-CM | POA: Diagnosis not present

## 2021-12-26 DIAGNOSIS — E1122 Type 2 diabetes mellitus with diabetic chronic kidney disease: Secondary | ICD-10-CM | POA: Insufficient documentation

## 2021-12-26 DIAGNOSIS — I8290 Acute embolism and thrombosis of unspecified vein: Secondary | ICD-10-CM | POA: Insufficient documentation

## 2021-12-26 DIAGNOSIS — I5022 Chronic systolic (congestive) heart failure: Secondary | ICD-10-CM | POA: Diagnosis not present

## 2021-12-26 DIAGNOSIS — I2510 Atherosclerotic heart disease of native coronary artery without angina pectoris: Secondary | ICD-10-CM

## 2021-12-26 DIAGNOSIS — I4891 Unspecified atrial fibrillation: Secondary | ICD-10-CM

## 2021-12-26 DIAGNOSIS — N183 Chronic kidney disease, stage 3 unspecified: Secondary | ICD-10-CM | POA: Diagnosis not present

## 2021-12-26 DIAGNOSIS — Z7984 Long term (current) use of oral hypoglycemic drugs: Secondary | ICD-10-CM | POA: Insufficient documentation

## 2021-12-26 DIAGNOSIS — E1151 Type 2 diabetes mellitus with diabetic peripheral angiopathy without gangrene: Secondary | ICD-10-CM | POA: Diagnosis not present

## 2021-12-26 LAB — BASIC METABOLIC PANEL
Anion gap: 8 (ref 5–15)
BUN: 31 mg/dL — ABNORMAL HIGH (ref 8–23)
CO2: 27 mmol/L (ref 22–32)
Calcium: 9.2 mg/dL (ref 8.9–10.3)
Chloride: 105 mmol/L (ref 98–111)
Creatinine, Ser: 1.51 mg/dL — ABNORMAL HIGH (ref 0.61–1.24)
GFR, Estimated: 49 mL/min — ABNORMAL LOW (ref >60)
Glucose, Bld: 163 mg/dL — ABNORMAL HIGH (ref 70–99)
Potassium: 4.9 mmol/L (ref 3.5–5.1)
Sodium: 140 mmol/L (ref 135–145)

## 2021-12-26 LAB — BRAIN NATRIURETIC PEPTIDE: B Natriuretic Peptide: 128.8 pg/mL — ABNORMAL HIGH (ref 0.0–100.0)

## 2021-12-26 MED ORDER — EMPAGLIFLOZIN 10 MG PO TABS: 10.0000 mg | ORAL_TABLET | Freq: Every day | ORAL | 0 refills | Status: DC

## 2021-12-26 MED ORDER — SPIRONOLACTONE 25 MG PO TABS: 25.0000 mg | ORAL_TABLET | ORAL | 0 refills | Status: DC

## 2021-12-26 MED ORDER — SPIRONOLACTONE 25 MG PO TABS: 25.0000 mg | ORAL_TABLET | ORAL | 2 refills | Status: DC

## 2021-12-26 NOTE — Patient Instructions (Signed)
Stop Potassium  Take Spironolactone 25 mg every other day   Restart Warfarin  Labs done today, your results will be available in MyChart, we will contact you for abnormal readings.  Your physician recommends that you schedule a follow-up appointment in: 3 months  If you have any questions or concerns before your next appointment please send [place] a message through [place] or call our office at [phone].    TO LEAVE A MESSAGE FOR THE NURSE SELECT OPTION 2, PLEASE LEAVE A MESSAGE INCLUDING: YOUR NAME DATE OF BIRTH CALL BACK NUMBER REASON FOR CALL**this is important as we prioritize the call backs  YOU WILL RECEIVE A CALL BACK THE SAME DAY AS LONG AS YOU CALL BEFORE 4:00 PM  At the [facility], you and your health needs are our priority. As part of our continuing mission to provide you with exceptional heart care, we have created designated Provider Care Teams. These Care Teams include your primary Cardiologist (physician) and Advanced Practice Providers (APPs- Physician Assistants and Nurse Practitioners) who all work together to provide you with the care you need, when you need it.   You may see any of the following providers on your designated Care Team at your next follow up: Dr Glori Bickers Dr Haynes Kerns, NP Lyda Jester, [place] [facility] [place], [place] Audry Riles, PharmD   Please be sure to bring in all your medications bottles to every appointment.

## 2021-12-26 NOTE — Progress Notes (Signed)
Patient ID: [name], male   DOB: 09/26/1951, 72 y.o.   MRN: [id number]    Advanced Heart Failure Clinic Note   PCP: [facility] Primary Cardiologist: Dr. Claiborne Billings  HPI: [name] is a 71 yo male with PAF, severe depression, DM2, CKD stage III, obesity, OSA and chronic systolic HF.  Admitted 6/30-05/16/14 with dyspena and LE edema. Found to be in afib with RVR. Diuresed with IV lasix and d/c weight 234 lbs.   Admitted 2/18 -01/10/2016 wit volume overload/Afib RVR  in the setting of medication compliance. Diuresed with IV lasix and transitioned to lasix 80 mg twice a day. Had TEE but  DC-CV was cancelled due to LA thrombus. Discharge weight 189 pounds.   Admitted 03/14/2016 with  A fib RVR and volume overload in the setting of medication noncompliance. Refused cath, Myoview 12/2015. EF 20% with defects noted on rest and stress images within the inferior wall and to lesser extent the septum suggest ischemic etiology. No reversibility. Restarted on HF meds. D/C with volume overload. Psychiatry met with him and he was deemed to have the ability to make decisions. Diuresed with IV lasix and transitioned to lasix 80 mg twice a day. Discharge weight was 216 pounds.   Echo 5/17 EF 15-20%  He was last seen in the HF clinic in 2021. EF had normalized.     Admitted 10/28/21 with increased shortness of breath and marked volume overload.  Admit weight 231 pounds. His medications were being delivered in bubbles packs and he was taking metoprolol once a day instead of twice a day and his diuretic regimen was lower. Echo repeated EF 45-50%. Placed on IV lasix and diuresed > 19 liters. Transitioned to lasix 20 mg daily. Hospital course complicated by AKI and A fib. Underwent TEE-DC-CV with conversion to NSR. Beta blocker held due to bradycardia. He was continued on amiodarone 200 mg daily. He was back in A fib on discharge.  Discharge weight 203 pounds.   Admitted 12/11/21 after mechanical  fall resulting in rib fracture 5-7 and PTX.  AC stopped due to risk of fall. Discharged 12/15/21.   Presents with his daughter. Overall feeling fine.Not walking around much at home.  Denies SOB/PND/Orthopnea.  He does have pain when sneezing or coughing. Appetite ok. No fever or chills. Taking all medications. His daughter prepares his medications.  He does not want to change any medications.   Cardiac Testing  TEE 11/06/21 EF 35% No PFO . No thrombus.   Echo 10/29/21 EF 45-50% Echo 2021 EF 55-60 Echo 5/17 EF 15-20%  11/06/21 DC-CV--> NSR   Myoview 12/31/15  Perfusion: Next defects noted on rest and stress images within the inferior wall and to lesser extent the septum. No reversible defects. Wall Motion: Severe generalized hypokinesia. Left ventricle is dilute Left Ventricular Ejection Fraction: 20 % End diastolic volume 321 ml End systolic volume 224 ml IMPRESSION: 1. 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 No evidence of ischemia. 2. Severe generalized hypokinesia with dilated left ventricle. 3. Left ventricular ejection fraction 20% 4. High-risk stress test findings*.  ROS: All systems negative except as listed in HPI, PMH and Problem List.  SH:  Social History   Socioeconomic History   Marital status: Single    Spouse name: Not on file   Number of children: Not on file   Years of education: Not on file   Highest education level: Not on file  Occupational History  Occupation: retired  Tobacco Use   Smoking status: Some Days    Years: 52.00    Types: Cigarettes, Cigars    Last attempt to quit: 05/2021    Years since quitting: 0.6   Smokeless tobacco: Never  Substance and Sexual Activity   Alcohol use: Not Currently   Drug use: Never   Sexual activity: Not on file  Other Topics Concern   Not on file  Social History Narrative   ** Merged History Encounter ** Lives in [place] by himself. Retired from [employer] and [employer]  for [employer].       Tobacco use, amount per day now:   Past tobacco use, amount per day:   How many years did you use tobacco:   Alcohol use (drinks per week): N/A   Diet:   Do you drink/eat things with caffeine:   Marital status:  Divorced                                What year were you married? 2003   Do you live in a house, apartment, assisted living, condo, trailer, etc.? House   Is it one or more stories? One   How many persons live in your home?   Do you have pets in your home?( please list) N/A   Highest Level of education completed? Bachelors Degree   Current or past profession: Teacher-Special Ed   Do you exercise?  Yes                                Type and how often? Daily squats, and push ups.   Do you have a living will? No   Do you have a DNR form?  No                                 If not, do you want to discuss one?   Do you have signed POA/HPOA forms?  No                      If so, please bring to you appointment      Do you have any difficulty bathing or dressing yourself? Bathing only if pants not shirts/not shorts.   Do you have any difficulty preparing food or eating? No   Do you have any difficulty managing your medications? No   Do you have any difficulty managing your finances? No   Do you have any difficulty affording your medications? No   Social Determinants of Radio broadcast assistant Strain: Not on file  Food Insecurity: No Food Insecurity   Worried About Charity fundraiser in the Last Year: Never true   Ran Out of Food in the Last Year: Never true  Transportation Needs: No Transportation Needs   Lack of Transportation (Medical): No   Lack of Transportation (Non-Medical): No  Physical Activity: Not on file  Stress: Not on file  Social Connections: Not on file  Intimate Partner Violence: Not on file    FH:  Family History  Problem Relation Age of Onset   Heart attack Mother        deceased   Diabetes Mother    Heart attack Sister      Past Medical History:  Diagnosis Date  A-fib ([place])    a. (06/04/14) TEE-DC-CV; succesful; large LA 6.2 cm   ADHD    Ascites    Athlete's foot    Chronic systolic heart failure ([place])    a. 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, unspecified CKD stage, unspecified whether long term insulin use (HCC)    Heart murmur    HTN (hypertension)    OSA (obstructive sleep apnea)    PVD (peripheral vascular disease) (HCC)    s/p great toe amputation    Current Outpatient Medications  Medication Sig Dispense Refill   acetaminophen (TYLENOL) 500 MG tablet Take 2 tablets (1,000 mg total) by mouth every 6 (six) hours as needed. 30 tablet 0   amiodarone (PACERONE) 200 MG tablet Take 1 tablet (200 mg total) by mouth daily. 30 tablet 0   blood glucose meter kit and supplies Dispense based on patient and insurance preference. Use up to four times daily as directed. (FOR ICD-10 E10.9, E11.9). 1 each 0   Cyanocobalamin (VITAMIN B-12 PO) Take 1 capsule by mouth daily.     diclofenac Sodium (VOLTAREN) 1 % GEL Apply topically 4 (four) times daily as needed.     empagliflozin (JARDIANCE) 10 MG TABS tablet Take 1 tablet (10 mg total) by mouth daily. 30 tablet 0   furosemide (LASIX) 20 MG tablet Take 1 tablet (20 mg total) by mouth 2 (two) times daily. May take an additional 23m daily as needed. 60 tablet 3   Green Tea, Camellia sinensis, (GREEN TEA EXTRACT PO) Take 1 Dose by mouth daily.     insulin glargine (LANTUS) 100 UNIT/ML injection Inject 0.08 mLs (8 Units total) into the skin daily. 10 mL 1   KRILL OIL OMEGA-3 PO Take 1 tablet by mouth daily.     methocarbamol (ROBAXIN) 500 MG tablet Take 1 tablet (500 mg total) by mouth every 8 (eight) hours as needed for muscle spasms. 50 tablet 1   potassium  chloride (MICRO-K) 10 MEQ CR capsule Take 10 mEq by mouth 2 (two) times daily.     sacubitril-valsartan (ENTRESTO) 49-51 MG Take 1 tablet by mouth 2 (two) times daily. 60 tablet 0   Sodium Hyaluronate, oral, (HYALURONIC ACID PO) Take 1 tablet by mouth daily. Vit c, 120 mg ,magnesium,159m     spironolactone (ALDACTONE) 25 MG tablet Take 0.5 tablet (12.5 mg total) by mouth daily. 15 tablet 2   traMADol (ULTRAM) 50 MG tablet Take 1 tablet (50 mg total) by mouth every 6 (six) hours as needed for moderate pain. 20 tablet 0   Vitamin D, Ergocalciferol, (DRISDOL) 1.25 MG (50000 UNIT) CAPS capsule TAKE 1 CAPSULE BY MOUTH EVERY THURSDAY 12 capsule 1   warfarin (COUMADIN) 6 MG tablet Take 3 mg by mouth daily. Tuesday, Thursday, and Saturday. (Patient not taking: Reported on 12/26/2021)     warfarin (COUMADIN) 6 MG tablet Take 6 mg by mouth daily. Monday, Wednesday, Friday, and Sunday. (Patient not taking: Reported on 12/26/2021)     No current facility-administered medications for this encounter.    Vitals:   12/26/21 1546  BP: 110/70  Pulse: 70  SpO2: 97%  Weight: 96.3 kg (212 lb 3.2 oz)     Wt Readings from Last 3 Encounters:  12/26/21 96.3 kg (212 lb 3.2 oz)  12/18/21 94 kg (207 lb 3.2 oz)  12/13/21 94.2 kg (207 lb 10.8 oz)    PHYSICAL EXAM: General:  No resp difficulty HEENT: normal Neck: supple. no JVD. Carotids 2+ bilat; no bruits. No lymphadenopathy or thryomegaly appreciated. Cor: PMI nondisplaced. Regular rate & rhythm. No rubs, gallops or murmurs. Lungs: clear Abdomen: obese soft, nontender, nondistended. No hepatosplenomegaly. No bruits or masses. Good bowel sounds. Extremities: no cyanosis, clubbing, rash, edema Neuro: alert & orientedx3, cranial nerves grossly intact. moves all 4 extremities w/o difficulty. Affect pleasant  ECG: Sinus rhythm 80 with PACs Personally reviewed  ASSESSMENT & PLAN:  1. Chronic HFmEF - Admitted with marked volume overload. Not taking medications  as prescribed. - Myoview 12/2015. EF 20% with defects noted on rest and stress images within the inferior wall and to lesser extent the septum suggest ischemic etiology. No reversibility. Refused cath multiple times. No s/s angina currently. HsTrop not checked on admit. ECG with nonspecific inferolateral TW abnormalities - Echo 10/21 EF 55-60%--->- 10/30/21 Echo  EF 45-50%, RV moderately reduced, RVSP 85 mmHg - NYHA II-III. Volume status stable. Continue lasix 20 mg twice a day.  - Continue entresto 49-51 mg twice a day  - Continue Jardiance 10 mg daily - Taken off bb during recent hospitalization due to bradycardia  - Continue spironolactone 12.5 mg daily   2. CKD Stage IIIa-IIIb - Creatinine 1.5-1.7  - Creatinine 1.73   3. DMII - Hgb A1C 8.2. On SSI - Continue Jardiance   4. HTN - Elevated  - Does not which medications he is taking will not change.    5. 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  - Says he is stable on his feet. No further falls - Restart warfarin   6.  H/O LV Thrombus - resolved - restarting warfarin for Afib/flutter   7. Fall--> Rib fractures 12/2021  Darrick Grinder NP-C  3:54 PM  Patient seen and examined with the above-signed Advanced Practice Provider and/or Housestaff. I personally reviewed laboratory data, imaging studies and relevant notes. I independently examined the patient and formulated the important aspects of the plan. I have edited the note to reflect any of my changes or salient points. I have personally discussed the plan with the patient and/or family.  Here with his daughter for f/u. 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 Warfarin held.   Has very tangential thoughts and speech. Denies CP or SOB. Complaining about many of his medicines and making odd references.   In NSR on ECG  General:   Sitting on exam No resp difficulty HEENT: normal Neck: supple. no JVD. Carotids 2+ bilat; no bruits. No lymphadenopathy or thryomegaly appreciated. Cor: PMI nondisplaced. Regular rate & rhythm. No rubs, gallops or murmurs. Lungs: clear Abdomen: obese soft, nontender, nondistended. No hepatosplenomegaly. No bruits or masses. Good bowel sounds. Extremities: no cyanosis, clubbing, rash, edema Neuro: alert & orientedx3, cranial nerves grossly intact. moves all 4 extremities w/o difficulty. Affect pleasant  Overall stable from HF perspective. Maintaining NSR on low-dose amio. We reviewed his medications 1 by 1 discussing benefits and potential side effects. He has elected to continue current meds for now. After a detailed discussion, I feel it is ok to resume warfarin for stroke protection in setting of PAF. Will check labs today.   Total time spent > 45 minutes. Over  half that time spent discussing above.   Glori Bickers, MD  1:15 PM

## 2021-12-30 ENCOUNTER — Other Ambulatory Visit (HOSPITAL_COMMUNITY): Payer: Self-pay

## 2021-12-30 DIAGNOSIS — H35031 Hypertensive retinopathy, right eye: Secondary | ICD-10-CM | POA: Diagnosis not present

## 2021-12-30 DIAGNOSIS — H4051X3 Glaucoma secondary to other eye disorders, right eye, severe stage: Secondary | ICD-10-CM | POA: Diagnosis not present

## 2021-12-30 DIAGNOSIS — H34811 Central retinal vein occlusion, right eye, with macular edema: Secondary | ICD-10-CM | POA: Diagnosis not present

## 2021-12-30 DIAGNOSIS — H4311 Vitreous hemorrhage, right eye: Secondary | ICD-10-CM | POA: Diagnosis not present

## 2021-12-30 MED ORDER — BRIMONIDINE TARTRATE 0.2 % OP SOLN
1.0000 [drp] | Freq: Two times a day (BID) | OPHTHALMIC | 5 refills | Status: DC
  Filled 2021-12-30: qty 5, 50d supply, fill #0

## 2022-01-01 ENCOUNTER — Telehealth: Payer: Self-pay

## 2022-01-01 DIAGNOSIS — S2241XD Multiple fractures of ribs, right side, subsequent encounter for fracture with routine healing: Secondary | ICD-10-CM | POA: Diagnosis not present

## 2022-01-01 DIAGNOSIS — E1122 Type 2 diabetes mellitus with diabetic chronic kidney disease: Secondary | ICD-10-CM | POA: Diagnosis not present

## 2022-01-01 DIAGNOSIS — I4891 Unspecified atrial fibrillation: Secondary | ICD-10-CM | POA: Diagnosis not present

## 2022-01-01 DIAGNOSIS — H543 Unqualified visual loss, both eyes: Secondary | ICD-10-CM | POA: Diagnosis not present

## 2022-01-01 DIAGNOSIS — I5043 Acute on chronic combined systolic (congestive) and diastolic (congestive) heart failure: Secondary | ICD-10-CM | POA: Diagnosis not present

## 2022-01-01 DIAGNOSIS — I130 Hypertensive heart and chronic kidney disease with heart failure and stage 1 through stage 4 chronic kidney disease, or unspecified chronic kidney disease: Secondary | ICD-10-CM | POA: Diagnosis not present

## 2022-01-01 DIAGNOSIS — Z794 Long term (current) use of insulin: Secondary | ICD-10-CM

## 2022-01-01 DIAGNOSIS — I5042 Chronic combined systolic (congestive) and diastolic (congestive) heart failure: Secondary | ICD-10-CM

## 2022-01-01 DIAGNOSIS — N183 Chronic kidney disease, stage 3 unspecified: Secondary | ICD-10-CM | POA: Diagnosis not present

## 2022-01-01 DIAGNOSIS — M1611 Unilateral primary osteoarthritis, right hip: Secondary | ICD-10-CM | POA: Diagnosis not present

## 2022-01-01 NOTE — Telephone Encounter (Signed)
Home health Nurse for diabetic and CHF education and medication management ordered. Okay to give verbal orders as requested.

## 2022-01-01 NOTE — Telephone Encounter (Signed)
Noted and orders was given as previously documented

## 2022-01-01 NOTE — Telephone Encounter (Signed)
Rosanne Ashing with [facility] called to relay a status update and to request verbal order for PT.  1.) Patient has dry skin and scabs in which he is seeing the podiatrist.  2.) Patient is out of tylenol, refuses to take lipitor, working on getting more krill oil, out of miralax and does not plan on restarting, and patient is not taking Vitamin B.  3.) Rosanne Ashing is recommending home health. Patient is not checking his blood sugars, daughter does not know how to work machine. Patient and daughter need diabetic and CHF education  4.) Rosanne Ashing is recommending a referral for a medical social worker for community resources  5.) Patient has a pending OT evaluation  6.) Verbal orders requested and given for 1 week 1, 0 week 2, and 1 week 1.

## 2022-01-02 ENCOUNTER — Telehealth: Payer: Self-pay

## 2022-01-02 ENCOUNTER — Other Ambulatory Visit (HOSPITAL_COMMUNITY): Payer: Self-pay

## 2022-01-02 DIAGNOSIS — E1122 Type 2 diabetes mellitus with diabetic chronic kidney disease: Secondary | ICD-10-CM

## 2022-01-02 MED ORDER — PREDNISOLONE ACETATE 1 % OP SUSP
1.0000 [drp] | Freq: Four times a day (QID) | OPHTHALMIC | 5 refills | Status: DC
  Filled 2022-01-02: qty 5, 25d supply, fill #0
  Filled 2022-01-14: qty 15, 75d supply, fill #0

## 2022-01-02 MED ORDER — OFLOXACIN 0.3 % OP SOLN
1.0000 [drp] | Freq: Four times a day (QID) | OPHTHALMIC | 3 refills | Status: DC
  Filled 2022-01-02 – 2022-01-14 (×2): qty 5, 25d supply, fill #0
  Filled 2022-02-02 (×2): qty 5, 25d supply, fill #1

## 2022-01-02 NOTE — Telephone Encounter (Signed)
Which medication is requested for refill?

## 2022-01-02 NOTE — Telephone Encounter (Signed)
Refill requested and be sent to [facility] pharmacy. Tried filling medication but it asked about updating diagnosis.  Sent to Richarda Blade, NP to send to [facility] out patient pharmacy.

## 2022-01-05 ENCOUNTER — Other Ambulatory Visit (HOSPITAL_COMMUNITY): Payer: Self-pay | Admitting: Internal Medicine

## 2022-01-05 ENCOUNTER — Other Ambulatory Visit (HOSPITAL_COMMUNITY): Payer: Self-pay

## 2022-01-05 MED ORDER — INSULIN GLARGINE 100 UNIT/ML ~~LOC~~ SOLN
8.0000 [IU] | Freq: Every day | SUBCUTANEOUS | 1 refills | Status: DC
  Filled 2022-01-05: qty 10, 28d supply, fill #0

## 2022-01-05 NOTE — Telephone Encounter (Signed)
Patient daughter states that patient needed more Lantus. Medication already sent into "[facility]" by Synetta Fail May, CMA.

## 2022-01-05 NOTE — Telephone Encounter (Signed)
Noted  

## 2022-01-05 NOTE — Telephone Encounter (Signed)
Patient daughter requested refill.  

## 2022-01-06 ENCOUNTER — Other Ambulatory Visit: Payer: Self-pay | Admitting: Family

## 2022-01-06 ENCOUNTER — Telehealth (HOSPITAL_COMMUNITY): Payer: Self-pay | Admitting: Cardiology

## 2022-01-06 NOTE — Telephone Encounter (Signed)
Patient has request refill on medication "Amiodarone 200mg ". Patient medication is due for refill but it also has end date. Please refill medication and dispense other refills. Medication pend and sent to PCP Ngetich, , NP for approval.

## 2022-01-06 NOTE — Telephone Encounter (Signed)
Pt called for medication consult before surgery

## 2022-01-06 NOTE — Telephone Encounter (Signed)
Please verify with cardiologist if Amiodarone is not be continued since coumadin was restarted.No documentation on latest visit for 12/26/2021.

## 2022-01-07 ENCOUNTER — Telehealth: Payer: Self-pay

## 2022-01-07 ENCOUNTER — Ambulatory Visit (INDEPENDENT_AMBULATORY_CARE_PROVIDER_SITE_OTHER): Payer: Medicare HMO | Admitting: *Deleted

## 2022-01-07 ENCOUNTER — Other Ambulatory Visit (HOSPITAL_COMMUNITY): Payer: Self-pay

## 2022-01-07 ENCOUNTER — Other Ambulatory Visit: Payer: Self-pay

## 2022-01-07 ENCOUNTER — Telehealth (HOSPITAL_COMMUNITY): Payer: Self-pay | Admitting: *Deleted

## 2022-01-07 DIAGNOSIS — I4891 Unspecified atrial fibrillation: Secondary | ICD-10-CM | POA: Diagnosis not present

## 2022-01-07 DIAGNOSIS — Z5181 Encounter for therapeutic drug level monitoring: Secondary | ICD-10-CM | POA: Diagnosis not present

## 2022-01-07 LAB — POCT INR: INR: 3.5 — AB (ref 2.0–3.0)

## 2022-01-07 NOTE — Telephone Encounter (Signed)
Pt is to have Vitrectomy on 3/2 by Dr. Stephannie Li. Called [facility] and made them aware pt's INR was 3.5 today. They stated they like INR to be less then 2 and will cancel the procedure and reschedule for 3/9. Confirmed that they have the correct fax #. [facility] stated that they would like for pt to hold warfarin 5 days prior to procedure.  ?

## 2022-01-07 NOTE — Telephone Encounter (Signed)
[facility] and provided correct fax number [phone]) and asked to place attn to "Pre Op Team". Verbalized understanding and stated she would send fax immediately. Made Danielle Rankin, CMA aware.  ?

## 2022-01-07 NOTE — Patient Instructions (Addendum)
?  Description   ?Called and spoke to pt's daughter instructed pt to hold warfarin today and then START taking warfarin 1/2 a tablet daily except for 1 tablet on Mondays and Fridays. HOLD WARFARIN 3/6, 3/7 & 3/8. Recheck INR on 3/8. Coumadin Clinic [phone].   ?  ?  ?

## 2022-01-07 NOTE — Telephone Encounter (Signed)
Lauren with [facility] called stating patient has right eye surgery pending for tomorrow (general anesthesia) and they have sent 3 faxes to patients cardiologist with no reply. 1st fax was sent 01/02/2022. ? ?Lauren sent a fax to [place] this morning in hope of getting clearance for patient to proceed with surgery. Per Lauren patient has advised their staff that he stopped his coumadin on his own on Monday.  ? ?Lauren asked that I stress the urgency of this message.  ?

## 2022-01-07 NOTE — Telephone Encounter (Signed)
Noted  

## 2022-01-07 NOTE — Telephone Encounter (Signed)
[name] with pts PCP office left vm asking if pt should continue amiodarone since he is on coumadin. I returned call to [facility] no answer/left detailed vm that per Amy Clegg,NP last office note pt is on amiodarone and coumadin. See below ? ?5. PAF/? AFL ?- s/p DCCV 11/06/21>>Sinus brady but was back in A fib prior to discharge.  ?- EKG today. A flutter 89 bpm ?- Discussed referral to A fib however he declined.   ?- Continue amio  ?- On coumadin   ?

## 2022-01-07 NOTE — Telephone Encounter (Signed)
? ?  Pre-operative Risk Assessment  ?  ?Patient Name: [name]  ?DOB: [birth year] ?MRN: [id number]  ? ?  ? ?Request for Surgical Clearance   ? ?Procedure:   VITRECTOMY WITH ENDOLASER PHOTOCOAGULATION, AHMED VALVE AND EYLEA INJECTION ? ?Date of Surgery:  Clearance 01/15/22                              ?   ?Surgeon:  DR. Stephannie Li ?Surgeon's Group or Practice Name:  [facility] ?Phone number:  [phone] ?Fax number:  [phone] ?  ?Type of Clearance Requested:   ?- Medical  ?- Pharmacy:  Hold Warfarin (Coumadin) x 5 DAYS PRIOR ?  ?Type of Anesthesia:  General  ?  ?Additional requests/questions:   ? ?Signed, ?Danielle Rankin   ?01/07/2022, 4:31 PM  ? ?

## 2022-01-07 NOTE — Telephone Encounter (Signed)
Patient with diagnosis of afib on warfarin for anticoagulation. 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 ? ?Procedure: vitrectomy ?Date of procedure: 01/15/22 ? ?CHA2DS2-VASc Score = 5  ?This indicates a 7.2% annual risk of stroke. ?The patient's score is based upon: ?CHF History: 1 ?HTN History: 1 ?Diabetes History: 1 ?Stroke History: 0 ?Vascular Disease History: 1 ?Age Score: 1 ?Gender Score: 0 ?  ?CrCl 71mL/min using adjusted body weight ?Platelet count 247K ? ?Request is for pt's INR to be < 2 for procedure. He should not require 5 day warfarin hold for this (would drop his INR to 1). Recommend he hold warfarin for up to 3 days prior to procedure as this should ensure INR is < 2 for procedure date. He does not require Lovenox bridging. ? ?

## 2022-01-07 NOTE — Telephone Encounter (Signed)
[name] from "Advanced Heart Failure" left a voicemail verbally confirming that her physician note states for patient to take "Amiodarone", and "Coumadin". Message routed back to PCP Ngetich, Dinah C, NP.  ?

## 2022-01-07 NOTE — Telephone Encounter (Signed)
Voicemail was left for office. Waiting for phone call back to confirm. ?

## 2022-01-08 NOTE — Telephone Encounter (Addendum)
? ?  Primary Cardiologist: Shelva Majestic, MD ? ?Chart reviewed as part of pre-operative protocol coverage. Given past medical history and time since last visit, based on ACC/AHA guidelines, [name] would be at acceptable risk for the planned procedure without further cardiovascular testing.  ? ?Patient with diagnosis of afib on warfarin for anticoagulation. 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 ?  ?Procedure: vitrectomy ?Date of procedure: 01/15/22 ?  ?CHA2DS2-VASc Score = 5  ?This indicates a 7.2% annual risk of stroke. ?The patient's score is based upon: ?CHF History: 1 ?HTN History: 1 ?Diabetes History: 1 ?Stroke History: 0 ?Vascular Disease History: 1 ?Age Score: 1 ?Gender Score: 0 ?  ?CrCl 32mL/min using adjusted body weight ?Platelet count 247K ?  ?Request is for pt's INR to be < 2 for procedure. He should not require 5 day warfarin hold for this (would drop his INR to 1). Recommend he hold warfarin for up to 3 days prior to procedure as this should ensure INR is < 2 for procedure date. He does not require Lovenox bridging. ? ?Coumadin clinic is addressing, he's having his warfarin dose decreased in addition to 3 day hold and will have INR checked the day before his procedure to confirm INR is < 2. He and his daughter are not the best historians so unclear if he has actually been holding for the past few days ? ?I will route this recommendation to the requesting party via Epic fax function and remove from pre-op pool. ? ?Please call with questions. ? ?Jossie Ng. Brianny Soulliere NP-C ? ?  ?01/08/2022, 8:16 AM ?[facility] ?[address] ?Office [phone] Fax [phone] ? ? ? ? ?

## 2022-01-08 NOTE — Telephone Encounter (Signed)
LATE ENTRY from 01/07/22. Pt needs clearance for vitrectomy under general anesthesia and ok to hold couamdin 5 days prior. ? ?Per Dr Gala Romney: Ok to hold couamdin"  ? ?Note faxed to [facility] ?

## 2022-01-10 ENCOUNTER — Other Ambulatory Visit: Payer: Self-pay | Admitting: Family

## 2022-01-12 ENCOUNTER — Other Ambulatory Visit (HOSPITAL_COMMUNITY): Payer: Self-pay

## 2022-01-12 NOTE — Telephone Encounter (Signed)
Patient has request refill on medications Jardiance 10mg , and Entresto. Medication is not on patient medication list. Medication pend and sent to PCP Ngetich, Sherryll Burger, NP for approval. Please Advise. ?

## 2022-01-14 ENCOUNTER — Ambulatory Visit (INDEPENDENT_AMBULATORY_CARE_PROVIDER_SITE_OTHER): Payer: Medicare HMO

## 2022-01-14 ENCOUNTER — Encounter: Payer: Self-pay | Admitting: Podiatry

## 2022-01-14 ENCOUNTER — Telehealth (HOSPITAL_COMMUNITY): Payer: Self-pay | Admitting: *Deleted

## 2022-01-14 ENCOUNTER — Other Ambulatory Visit: Payer: Self-pay

## 2022-01-14 ENCOUNTER — Telehealth (HOSPITAL_COMMUNITY): Payer: Self-pay | Admitting: Pharmacy Technician

## 2022-01-14 ENCOUNTER — Ambulatory Visit (INDEPENDENT_AMBULATORY_CARE_PROVIDER_SITE_OTHER): Payer: Medicare HMO | Admitting: Podiatry

## 2022-01-14 ENCOUNTER — Other Ambulatory Visit (HOSPITAL_COMMUNITY): Payer: Self-pay

## 2022-01-14 DIAGNOSIS — E1122 Type 2 diabetes mellitus with diabetic chronic kidney disease: Secondary | ICD-10-CM

## 2022-01-14 DIAGNOSIS — Z794 Long term (current) use of insulin: Secondary | ICD-10-CM | POA: Diagnosis not present

## 2022-01-14 DIAGNOSIS — B351 Tinea unguium: Secondary | ICD-10-CM

## 2022-01-14 DIAGNOSIS — N1832 Chronic kidney disease, stage 3b: Secondary | ICD-10-CM

## 2022-01-14 DIAGNOSIS — I513 Intracardiac thrombosis, not elsewhere classified: Secondary | ICD-10-CM | POA: Diagnosis not present

## 2022-01-14 DIAGNOSIS — I739 Peripheral vascular disease, unspecified: Secondary | ICD-10-CM

## 2022-01-14 DIAGNOSIS — Z5181 Encounter for therapeutic drug level monitoring: Secondary | ICD-10-CM

## 2022-01-14 DIAGNOSIS — I4891 Unspecified atrial fibrillation: Secondary | ICD-10-CM

## 2022-01-14 DIAGNOSIS — M79674 Pain in right toe(s): Secondary | ICD-10-CM

## 2022-01-14 DIAGNOSIS — M79675 Pain in left toe(s): Secondary | ICD-10-CM | POA: Diagnosis not present

## 2022-01-14 DIAGNOSIS — I483 Typical atrial flutter: Secondary | ICD-10-CM

## 2022-01-14 LAB — POCT INR: INR: 1.4 — AB (ref 2.0–3.0)

## 2022-01-14 NOTE — Progress Notes (Signed)
?  Subjective:  ?Patient ID: [name], male    DOB: 1951-08-01,   MRN: [id number] ? ?Chief Complaint  ?Patient presents with  ? Nail Problem  ?  Routine foot care  ? ? ?71 y.o. male presents for concern of thickened elongated and painful nails that are difficult to trim. Requesting to have them trimmed today. Denies any burning or tingling in her feet. Patient is diabetic and last A1c was 6.8.  ? . Denies any other pedal complaints. Denies n/v/f/c.  ? ?PCP: [name] NP  ? ?Past Medical History:  ?Diagnosis Date  ? A-fib [facility])   ? a. (06/04/14) TEE-DC-CV; succesful; large LA 6.2 cm  ? ADHD   ? Ascites   ? Athlete's foot   ? Chronic systolic heart failure ([place])   ? a. ECHO (05/2014): EF 25-30%, diff HK, RV midly dilated and sys fx mildly/mod reduced  ? CKD (chronic kidney disease) stage 3, GFR 30-59 ml/min (HCC)   ? Depression   ? Diabetes mellitus due to underlying condition with diabetic chronic kidney disease, unspecified CKD stage, unspecified whether long term insulin use ([place])   ? Heart murmur   ? HTN (hypertension)   ? OSA (obstructive sleep apnea)   ? PVD (peripheral vascular disease) ([place])   ? s/p great toe amputation  ? ? ?Objective:  ?Physical Exam: ?Vascular: DP/PT pulses 2/4 bilateral. CFT <3 seconds. Absent hair growth on digits. Edema noted to bilateral lower extremities. Xerosis noted bilaterally.  ?Skin. No lacerations or abrasions bilateral feet. Nails 1-5 bilateral  are thickened discolored and elongated with subungual debris.  ?Musculoskeletal: MMT 5/5 bilateral lower extremities in DF, PF, Inversion and Eversion. Deceased ROM in DF of ankle joint.  ?Neurological: Sensation intact to light touch. Protective sensation diminished bilateral.  ? ? ?Assessment:  ? ?1. Type 2 diabetes mellitus with stage 3b chronic kidney disease, with long-term current use of insulin ([place])   ?2. PVD (peripheral vascular disease) ([place])   ?3. Pain due to onychomycosis of toenails of both feet   ? ? ? ?Plan:   ?Patient was evaluated and treated and all questions answered. ?-Discussed and educated patient on diabetic foot care, especially with  ?regards to the vascular, neurological and musculoskeletal systems.  ?-Stressed the importance of good glycemic control and the detriment of not  ?controlling glucose levels in relation to the foot. ?-Discussed supportive shoes at all times and checking feet regularly.  ?-Mechanically debrided all nails 1-5 bilateral using sterile nail nipper and filed with dremel without incident  ?-Answered all patient questions ?-Patient to return  in 3 months for at risk foot care ?-Patient advised to call the office if any problems or questions arise in the meantime. ? ? ?Lorenda Peck, DPM  ? ? ?

## 2022-01-14 NOTE — Telephone Encounter (Signed)
Called pts daughter to advise. Pts daughter and pt were on the phone both told me that Dr.Bensimhon told pt he could stop amiodarone at any time and did not have to tell the office because it was a medication he didn't really need and he doesn't need it for diabetes they also said it was causing his INR levels to be too high. I explained to them that amiodarone was for his afib not for diabetes and he should never stop a medication without consulting his physician. Pts daughter was adamant that pt did not need to be on amiodarone. I explained to her that from the last office note it says he was to continue this medication. Pts daughter requested an appt for pt but pt refused an appt at this time. Pt is scheduled for f/u in May but will call back if he wants to be seen sooner. I again tried to advise pt to take his amiodarone to avoid going back into afib both pt and daughter insisted he did not need the medication. Daughter thanked me and ended the call.  ?

## 2022-01-14 NOTE — Telephone Encounter (Signed)
Advanced Heart Failure Patient Advocate Encounter ? ?The patient was approved for a [employer] that will help cover the cost of Entresto and Jardiance. Total amount awarded, $10,000. Eligibility dates, 12/15/21 - 12/14/22. ?  ?ID [id number] ?  ?BIN [id number] ?  ?PCN [id number] ?  ?GROUP [id number] ? ?Sent patient copy of grant information. In the future, he would like to use mail order at [facility]. Will send requests for RXs to be sent there. Provided phone number to [facility] as well.  ? ?Archer Asa, CPhT ? ? ?

## 2022-01-14 NOTE — Telephone Encounter (Signed)
I received a message from [facility] that patient stopped amiodarone because its messing with his warfarin levels and they are afraid he wont be able to get his eye surgery. INR running at 3.5 per pt. Per coumadin clinic note on 3/2 (see below) they have a plan to decrease warfarin and hold 3 days prior to surgery to get patient to goal. No mention of stopping amio in coumadin clinic note. Will route a provider for further advice.  ? ?Routed to [facility] [place],FNP  ? ?  ?"Chart reviewed as part of pre-operative protocol coverage. Given past medical history and time since last visit, based on ACC/AHA guidelines, [name] would be at acceptable risk for the planned procedure without further cardiovascular testing.  ?  ?Patient with diagnosis of afib on warfarin for anticoagulation. 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 ?  ?Procedure: vitrectomy ?Date of procedure: 01/15/22 ?  ?CHA2DS2-VASc Score = 5  ?This indicates a 7.2% annual risk of stroke. ?The patient's score is based upon: ?CHF History: 1 ?HTN History: 1 ?Diabetes History: 1 ?Stroke History: 0 ?Vascular Disease History: 1 ?Age Score: 1 ?Gender Score: 0 ?  ?CrCl 77mL/min using adjusted body weight ?Platelet count 247K ?  ?Request is for pt's INR to be < 2 for procedure. He should not require 5 day warfarin hold for this (would drop his INR to 1). Recommend he hold warfarin for up to 3 days prior to procedure as this should ensure INR is < 2 for procedure date. He does not require Lovenox bridging. ?  ?Coumadin clinic is addressing, he's having his warfarin dose decreased in addition to 3 day hold and will have INR checked the day before his procedure to confirm INR is < 2. He and his daughter are not the best historians so unclear if he has actually been holding for the past few days ?  ?I will route this recommendation to the requesting party via Epic fax function and remove from pre-op pool. ?  ?Please call with  questions. ?  ?Jossie Ng. Cleaver NP-C" ?

## 2022-01-14 NOTE — Patient Instructions (Addendum)
Description   ?Continue to hold Warfarin today for scheduled procedure tomorrow (01/15/22) then resume warfarin (as long as okay with surgeon) 1/2 a tablet daily except for 1 tablet on Mondays and Fridays. Recheck INR in 1 week.  ?Coumadin Clinic [phone].   ?  ?   ?

## 2022-01-15 ENCOUNTER — Telehealth: Payer: Self-pay

## 2022-01-15 DIAGNOSIS — H26491 Other secondary cataract, right eye: Secondary | ICD-10-CM | POA: Diagnosis not present

## 2022-01-15 DIAGNOSIS — H4089 Other specified glaucoma: Secondary | ICD-10-CM | POA: Diagnosis not present

## 2022-01-15 DIAGNOSIS — H4051X3 Glaucoma secondary to other eye disorders, right eye, severe stage: Secondary | ICD-10-CM | POA: Diagnosis not present

## 2022-01-15 DIAGNOSIS — H4311 Vitreous hemorrhage, right eye: Secondary | ICD-10-CM | POA: Diagnosis not present

## 2022-01-15 DIAGNOSIS — H34811 Central retinal vein occlusion, right eye, with macular edema: Secondary | ICD-10-CM | POA: Diagnosis not present

## 2022-01-15 NOTE — Telephone Encounter (Signed)
Incoming call received from [place] with [facility] stating they have maximized attempts to connect with patient for skilled nursing. Each time a visit was scheduled, they asked for it to be rescheduled or they were told the patient and/or family member is not available.  ? ?It has been 3 weeks of attempts with no success, primarily due to limitations with the families schedule. ? ? ? ? ? ?

## 2022-01-21 ENCOUNTER — Telehealth: Payer: Self-pay | Admitting: *Deleted

## 2022-01-21 NOTE — Telephone Encounter (Signed)
Noted.Continue with home health next week ?

## 2022-01-21 NOTE — Telephone Encounter (Signed)
[name] with [facility] called and stated that there is a delay in Home Health. Stated that visit was scheduled for this week but patient had Eye Surgery and asked to move it to next week.  ? ? ?FYI ?

## 2022-01-22 ENCOUNTER — Other Ambulatory Visit (HOSPITAL_COMMUNITY): Payer: Self-pay

## 2022-01-22 ENCOUNTER — Ambulatory Visit (INDEPENDENT_AMBULATORY_CARE_PROVIDER_SITE_OTHER): Payer: Medicare HMO | Admitting: *Deleted

## 2022-01-22 ENCOUNTER — Other Ambulatory Visit: Payer: Self-pay

## 2022-01-22 ENCOUNTER — Other Ambulatory Visit (HOSPITAL_COMMUNITY): Payer: Self-pay | Admitting: *Deleted

## 2022-01-22 DIAGNOSIS — I4891 Unspecified atrial fibrillation: Secondary | ICD-10-CM | POA: Diagnosis not present

## 2022-01-22 DIAGNOSIS — Z5181 Encounter for therapeutic drug level monitoring: Secondary | ICD-10-CM | POA: Diagnosis not present

## 2022-01-22 LAB — POCT INR: INR: 1.6 — AB (ref 2.0–3.0)

## 2022-01-22 MED ORDER — SPIRONOLACTONE 25 MG PO TABS
25.0000 mg | ORAL_TABLET | ORAL | 3 refills | Status: DC
  Filled 2022-01-22: qty 15, 30d supply, fill #0

## 2022-01-22 MED ORDER — EMPAGLIFLOZIN 10 MG PO TABS
ORAL_TABLET | ORAL | 3 refills | Status: DC
  Filled 2022-01-22: qty 30, 30d supply, fill #0

## 2022-01-22 MED ORDER — FUROSEMIDE 20 MG PO TABS
20.0000 mg | ORAL_TABLET | Freq: Two times a day (BID) | ORAL | 3 refills | Status: DC
  Filled 2022-01-22: qty 90, 45d supply, fill #0
  Filled 2022-02-02 (×2): qty 90, 30d supply, fill #0
  Filled 2022-03-20: qty 90, 30d supply, fill #1

## 2022-01-22 MED ORDER — METOPROLOL SUCCINATE ER 100 MG PO TB24
100.0000 mg | ORAL_TABLET | Freq: Two times a day (BID) | ORAL | 3 refills | Status: DC
  Filled 2022-01-22: qty 60, 30d supply, fill #0

## 2022-01-22 NOTE — Patient Instructions (Signed)
Description   ?Take 1 tablet of warfarin today and 1.5 tablets tomorrow, then continue to take 1/2 a tablet daily except for 1 tablet on Mondays and Fridays. Recheck INR in 1 week.  ?Coumadin Clinic [phone].   ?  ? ? ?

## 2022-01-28 ENCOUNTER — Telehealth: Payer: Self-pay

## 2022-01-28 NOTE — Telephone Encounter (Signed)
Noted  

## 2022-01-28 NOTE — Telephone Encounter (Signed)
Rosanne Ashing from [facility] called and stated that they are discharging [name] from physical therapy due to declining the services. Rosanne Ashing stated that "he did not seem very interested in at the time and restricted due to his upcoming eye surgery on April 11 th" Rosanne Ashing stated that if he wanted to continue back with physical therapy, then he would need to make a referral.  ? ?FYI routed to Ngetich, Donalee Citrin, NP ? ?

## 2022-01-29 ENCOUNTER — Other Ambulatory Visit: Payer: Self-pay

## 2022-01-29 ENCOUNTER — Ambulatory Visit (INDEPENDENT_AMBULATORY_CARE_PROVIDER_SITE_OTHER): Payer: Medicare HMO

## 2022-01-29 DIAGNOSIS — I4891 Unspecified atrial fibrillation: Secondary | ICD-10-CM

## 2022-01-29 DIAGNOSIS — Z5181 Encounter for therapeutic drug level monitoring: Secondary | ICD-10-CM | POA: Diagnosis not present

## 2022-01-29 LAB — POCT INR: INR: 2.7 (ref 2.0–3.0)

## 2022-01-29 NOTE — Patient Instructions (Signed)
Description   ?Continue to take 1/2 a tablet daily except for 1 tablet on Mondays and Fridays. Recheck INR in 3 weeks.  ?Pt stopped taking amio on 3/7 ?Coumadin Clinic [phone].   ?  ?   ?

## 2022-01-30 ENCOUNTER — Other Ambulatory Visit (HOSPITAL_COMMUNITY): Payer: Self-pay

## 2022-02-02 ENCOUNTER — Other Ambulatory Visit (HOSPITAL_COMMUNITY): Payer: Self-pay

## 2022-02-12 ENCOUNTER — Other Ambulatory Visit: Payer: Self-pay | Admitting: Family

## 2022-02-12 ENCOUNTER — Other Ambulatory Visit (HOSPITAL_COMMUNITY): Payer: Self-pay

## 2022-02-13 ENCOUNTER — Other Ambulatory Visit (HOSPITAL_COMMUNITY): Payer: Self-pay

## 2022-02-13 ENCOUNTER — Other Ambulatory Visit: Payer: Self-pay | Admitting: Family

## 2022-02-19 ENCOUNTER — Ambulatory Visit (INDEPENDENT_AMBULATORY_CARE_PROVIDER_SITE_OTHER): Payer: Medicare HMO

## 2022-02-19 DIAGNOSIS — I4891 Unspecified atrial fibrillation: Secondary | ICD-10-CM | POA: Diagnosis not present

## 2022-02-19 DIAGNOSIS — Z5181 Encounter for therapeutic drug level monitoring: Secondary | ICD-10-CM | POA: Diagnosis not present

## 2022-02-19 DIAGNOSIS — I513 Intracardiac thrombosis, not elsewhere classified: Secondary | ICD-10-CM | POA: Diagnosis not present

## 2022-02-19 LAB — POCT INR: INR: 1.8 — AB (ref 2.0–3.0)

## 2022-02-19 NOTE — Patient Instructions (Signed)
Description   ?Take 1 tablet today and then continue to take 1/2 a tablet daily except for 1 tablet on Mondays and Fridays. Recheck INR in 3 weeks.  ?Pt stopped taking amio on 3/7 ?Coumadin Clinic [phone].   ?  ?   ?

## 2022-02-26 ENCOUNTER — Telehealth: Payer: Self-pay | Admitting: *Deleted

## 2022-02-26 NOTE — Telephone Encounter (Signed)
Patient daughter walked into office with a picture of patient's wound on the heel of his foot. Blackened in color from picture with redness. Patient is diabetic. Daughter stated that it looks much better and she has been keeping it uncovered while inside.  ?Daughter has been putting Silver Spray on it and it has helped but thinking he needs antibiotics.  ? ?I offered appointment for today and tomorrow but refused.  ?Scheduled an appointment for 3:20 on Monday with Rob[facility].   ? ?FYI ?

## 2022-02-26 NOTE — Telephone Encounter (Signed)
I agree he needs an in office appointment for evaluation.  ?

## 2022-03-02 ENCOUNTER — Encounter: Payer: Self-pay | Admitting: Family

## 2022-03-02 ENCOUNTER — Encounter: Payer: Medicaid Other | Admitting: Family

## 2022-03-02 ENCOUNTER — Other Ambulatory Visit (HOSPITAL_COMMUNITY): Payer: Self-pay

## 2022-03-03 ENCOUNTER — Ambulatory Visit (INDEPENDENT_AMBULATORY_CARE_PROVIDER_SITE_OTHER): Payer: Medicare HMO | Admitting: Family

## 2022-03-03 ENCOUNTER — Encounter: Payer: Self-pay | Admitting: Family

## 2022-03-03 VITALS — BP 134/70 | HR 82 | Temp 98.2°F | Resp 18 | Ht 68.0 in | Wt 211.6 lb

## 2022-03-03 DIAGNOSIS — L97401 Non-pressure chronic ulcer of unspecified heel and midfoot limited to breakdown of skin: Secondary | ICD-10-CM | POA: Diagnosis not present

## 2022-03-03 NOTE — Patient Instructions (Signed)
Cleanse right leg wound  with saline ,pat dry, apply triple antibiotic ointment and covered with foam dressing for extra protection and absorption.change dressing every other day until healed. ?

## 2022-03-03 NOTE — Progress Notes (Signed)
? ?Provider: [name] FNP-C ? ?Greidy Sherard, Nelda Bucks, NP ? ?Patient Care Team: ?Dayln Tugwell, Nelda Bucks, NP as PCP - General (Family Medicine) ?Bensimhon, Shaune Pascal, MD as PCP - Advanced Heart Failure (Cardiology) ?Troy Sine, MD as PCP - Cardiology (Cardiology) ?Troy Sine, MD as Consulting Physician (Cardiology) ?Larey Dresser, MD as Consulting Physician (Cardiology) ? ?Extended Emergency Contact Information ?Primary Emergency Contact: Zink,Elaina ?Mobile Phone: [phone] ?Relation: Daughter ?Secondary Emergency Contact: McCarthy,Diane ? [place] ?Home Phone: [phone] ?Mobile Phone: [phone] ?Relation: Other ? ?Code Status:  Full Code  ?Goals of care: Advanced Directive information ? ?  03/03/2022  ?  8:40 AM  ?Advanced Directives  ?Does Patient Have a Medical Advance Directive? No  ?Would patient like information on creating a medical advance directive? No - Patient declined  ? ? ? ?Chief Complaint  ?Patient presents with  ? Acute Visit  ?  Patient complains of wound on right heel.  ? ? ?HPI:  ?Pt is a 71 y.o. male seen today for an acute visit for evaluation of right foot wound x 1 week.More painful with standing.Thinks probably from the shoes.No fever or chills.  Also denies any drainage from the wound.Daughter states patient usually sits with heel pressed on recliner causing wound.  Daughter has been cleaning with peroxide and applying foam dressing for protection and absorption ?No home CBG  ? ? ?Past Medical History:  ?Diagnosis Date  ? A-fib [facility])   ? a. (06/04/14) TEE-DC-CV; succesful; large LA 6.2 cm  ? ADHD   ? Ascites   ? Athlete's foot   ? Chronic systolic heart failure ([place])   ? a. ECHO (05/2014): EF 25-30%, diff HK, RV midly dilated and sys fx mildly/mod reduced  ? CKD (chronic kidney disease) stage 3, GFR 30-59 ml/min (HCC)   ? Depression   ? Diabetes mellitus due to underlying condition with diabetic chronic kidney disease, unspecified CKD stage, unspecified  whether long term insulin use ([place])   ? Heart murmur   ? HTN (hypertension)   ? OSA (obstructive sleep apnea)   ? PVD (peripheral vascular disease) ([place])   ? s/p great toe amputation  ? ?Past Surgical History:  ?Procedure Laterality Date  ? ABDOMINAL AORTOGRAM W/LOWER EXTREMITY N/A 05/19/2021  ? Procedure: ABDOMINAL AORTOGRAM W/LOWER EXTREMITY;  Surgeon: Marty Heck, MD;  Location: [place] [facility];  Service: Cardiovascular;  Laterality: N/A;  ? AMPUTATION Left 05/22/2021  ? Procedure: LEFT FIFTH TOE AMPUTATION;  Surgeon: Marty Heck, MD;  Location: [place];  Service: Vascular;  Laterality: Left;  ? CARDIOVERSION N/A 06/04/2014  ? Procedure: CARDIOVERSION;  Surgeon: Larey Dresser, MD;  Location: [place];  Service: Cardiovascular;  Laterality: N/A;  ? CARDIOVERSION N/A 11/06/2021  ? Procedure: CARDIOVERSION;  Surgeon: Larey Dresser, MD;  Location[facility];  Service: Cardiovascular;  Laterality: N/A;  ? NOSE SURGERY    ? Nasal septum surgery  ? PERIPHERAL VASCULAR INTERVENTION Left 05/19/2021  ? Procedure: PERIPHERAL VASCULAR INTERVENTION;  Surgeon: Marty Heck, MD;  Location: [place] [facility];  Service: Cardiovascular;  Laterality: Left;  ? TEE WITHOUT CARDIOVERSION N/A 06/04/2014  ? Procedure: TRANSESOPHAGEAL ECHOCARDIOGRAM (TEE);  Surgeon: Larey Dresser, MD;  Location: [place];  Service: Cardiovascular;  Laterality: N/A;  ? TEE WITHOUT CARDIOVERSION N/A 01/06/2016  ? Procedure: TRANSESOPHAGEAL ECHOCARDIOGRAM (TEE);  Surgeon: Fay Records, MD;  Location: [place];  Service: Cardiovascular;  Laterality: N/A;  ? TEE WITHOUT CARDIOVERSION N/A 11/06/2021  ?  Procedure: TRANSESOPHAGEAL ECHOCARDIOGRAM (TEE);  Surgeon: Larey Dresser, MD;  Location: [facility] ENDOSCOPY;  Service: Cardiovascular;  Laterality: N/A;  ? VASECTOMY    ? ? ?No Known Allergies ? ?Outpatient Encounter Medications as of 03/03/2022  ?Medication Sig  ? blood glucose meter kit and supplies Dispense based on  patient and insurance preference. Use up to four times daily as directed. (FOR ICD-10 E10.9, E11.9).  ? diclofenac Sodium (VOLTAREN) 1 % GEL Apply topically 4 (four) times daily as needed.  ? ENTRESTO 49-51 MG TAKE 1 TABLET BY MOUTH TWICE DAILY  ? furosemide (LASIX) 20 MG tablet Take 1 tablet by mouth 2 (two) times daily. May take an additional tablet daily if needed.  ? Green Tea, Camellia sinensis, (GREEN TEA EXTRACT PO) Take 1 Dose by mouth daily.  ? insulin glargine (LANTUS) 100 UNIT/ML injection Inject 0.08 mLs (8 Units total) into the skin daily.  ? JARDIANCE 10 MG TABS tablet TAKE 1 TABLET(10 MG) BY MOUTH DAILY  ? KRILL OIL OMEGA-3 PO Take 1 tablet by mouth daily.  ? methocarbamol (ROBAXIN) 500 MG tablet Take 1 tablet (500 mg total) by mouth every 8 (eight) hours as needed for muscle spasms.  ? Sodium Hyaluronate, oral, (HYALURONIC ACID PO) Take 1 tablet by mouth daily. Vit c, 120 mg ,magnesium,1m,  ? spironolactone (ALDACTONE) 25 MG tablet Take 1 tablet (25 mg total) by mouth every other day.  ? traMADol (ULTRAM) 50 MG tablet Take 1 tablet (50 mg total) by mouth every 6 (six) hours as needed for moderate pain.  ? Vitamin D, Ergocalciferol, (DRISDOL) 1.25 MG (50000 UNIT) CAPS capsule TAKE 1 CAPSULE BY MOUTH EVERY THURSDAY  ? warfarin (COUMADIN) 6 MG tablet Take 6 mg by mouth daily. Monday/Friday.  ? warfarin (COUMADIN) 6 MG tablet Take 3 mg by mouth daily. Tuesday, Wednesday, Thursday, Saturday, and Sunday.  ? [DISCONTINUED] acetaminophen (TYLENOL) 500 MG tablet Take 2 tablets (1,000 mg total) by mouth every 6 (six) hours as needed.  ? [DISCONTINUED] amiodarone (PACERONE) 200 MG tablet TAKE 1 TABLET(200 MG) BY MOUTH DAILY (Patient not taking: Reported on 01/22/2022)  ? [DISCONTINUED] brimonidine (ALPHAGAN) 0.2 % ophthalmic solution Place 1 drop into the right eye 2 (two) times daily.  ? [DISCONTINUED] Cyanocobalamin (VITAMIN B-12 PO) Take 1 capsule by mouth daily.  ? [DISCONTINUED] metoprolol succinate  (TOPROL-XL) 100 MG 24 hr tablet Take 1 tablet (100 mg total) by mouth 2 (two) times daily. Take with or immediately following a meal.  ? [DISCONTINUED] ofloxacin (OCUFLOX) 0.3 % ophthalmic solution Place 1 drop into the right eye 4 (four) times daily.  Begin one day after surgery and continue as directed  ? [DISCONTINUED] prednisoLONE acetate (PRED FORTE) 1 % ophthalmic suspension Place 1 drop into the right eye 4 (four) times daily.  Begin one day after surgery and continue as directed  ? ?No facility-administered encounter medications on file as of 03/03/2022.  ? ? ?Review of Systems  ?Constitutional:  Negative for appetite change, chills, fatigue and fever.  ?Respiratory:  Negative for cough, chest tightness, shortness of breath and wheezing.   ?Cardiovascular:  Negative for chest pain, palpitations and leg swelling.  ?Gastrointestinal:  Negative for abdominal distention, abdominal pain, nausea and vomiting.  ?Skin:  Positive for wound. Negative for color change, pallor and rash.  ? ?Immunization History  ?Administered Date(s) Administered  ? Tdap 05/01/2016  ? ?Pertinent  Health Maintenance Due  ?Topic Date Due  ? FOOT EXAM  Never done  ? OPHTHALMOLOGY EXAM  Never done  ? INFLUENZA VACCINE  06/09/2022  ? HEMOGLOBIN A1C  06/10/2022  ? COLONOSCOPY (Pts 45-46yr Insurance coverage will need to be confirmed)  Discontinued  ? ? ?  12/13/2021  ?  9:00 PM 12/14/2021  ?  4:00 PM 12/15/2021  ?  9:46 AM 12/18/2021  ? 11:48 AM 03/03/2022  ?  8:40 AM  ?Fall Risk  ?Falls in the past year?    1 0  ?Was there an injury with Fall?    1 0  ?Fall Risk Category Calculator    3 0  ?Fall Risk Category    High Low  ?Patient Fall Risk Level High fall risk High fall risk High fall risk High fall risk Low fall risk  ?Patient at Risk for Falls Due to    History of fall(s) No Fall Risks  ?Fall risk Follow up    Falls evaluation completed;Education provided;Falls prevention discussed Falls evaluation completed  ? ?Functional Status Survey: ?   ? ?Vitals:  ? 03/03/22 0826  ?BP: 134/70  ?Pulse: 82  ?Resp: 18  ?Temp: 98.2 ?F (36.8 ?C)  ?SpO2: 93%  ?Weight: 211 lb 9.6 oz (96 kg)  ?Height: '5\' 8"'  (1.727 m)  ? ?Body mass index is 32.17 kg/m?.Marland Kitchen?Physical Exam ?Vitals r

## 2022-03-08 NOTE — Progress Notes (Signed)
  This encounter was created in error - please disregard. No show 

## 2022-03-09 ENCOUNTER — Telehealth: Payer: Self-pay | Admitting: Podiatry

## 2022-03-09 NOTE — Telephone Encounter (Signed)
Tried calling but number was disconnected. If she calls back she can use either ointment and instead of the bandage she can use gauze and an ace wrap.  ?

## 2022-03-09 NOTE — Telephone Encounter (Signed)
Pts daughter called and pt has a wound on his heel per the pcp they told her to use antibiotic ointment and a bandaid. She states when she uses a regular band aid it pulls off the skin. She went and got a different bandage but is asking if she could use mupirocin ointment or should she just use the neosporin.. Pt is scheduled to see you on Wednesday morning. He is diabetic and the daughter states it smells. I tried to get them in today at 36 but pts daughter could not get them here.  Please call daughter @ [phone] about medication.  ?

## 2022-03-09 NOTE — Telephone Encounter (Signed)
Notified pts daughter and she said thank you. We had her number wrong by 1 number... ?

## 2022-03-11 ENCOUNTER — Ambulatory Visit (INDEPENDENT_AMBULATORY_CARE_PROVIDER_SITE_OTHER): Payer: Medicare HMO

## 2022-03-11 ENCOUNTER — Ambulatory Visit (INDEPENDENT_AMBULATORY_CARE_PROVIDER_SITE_OTHER): Payer: Medicare HMO | Admitting: Podiatry

## 2022-03-11 ENCOUNTER — Other Ambulatory Visit (HOSPITAL_COMMUNITY): Payer: Self-pay

## 2022-03-11 ENCOUNTER — Encounter: Payer: Self-pay | Admitting: Podiatry

## 2022-03-11 DIAGNOSIS — L97413 Non-pressure chronic ulcer of right heel and midfoot with necrosis of muscle: Secondary | ICD-10-CM

## 2022-03-11 DIAGNOSIS — L97411 Non-pressure chronic ulcer of right heel and midfoot limited to breakdown of skin: Secondary | ICD-10-CM | POA: Diagnosis not present

## 2022-03-11 DIAGNOSIS — E1122 Type 2 diabetes mellitus with diabetic chronic kidney disease: Secondary | ICD-10-CM | POA: Diagnosis not present

## 2022-03-11 DIAGNOSIS — N1832 Chronic kidney disease, stage 3b: Secondary | ICD-10-CM | POA: Diagnosis not present

## 2022-03-11 DIAGNOSIS — Z794 Long term (current) use of insulin: Secondary | ICD-10-CM

## 2022-03-11 DIAGNOSIS — I739 Peripheral vascular disease, unspecified: Secondary | ICD-10-CM | POA: Diagnosis not present

## 2022-03-11 MED ORDER — DOXYCYCLINE HYCLATE 100 MG PO TABS
100.0000 mg | ORAL_TABLET | Freq: Two times a day (BID) | ORAL | 0 refills | Status: AC
  Filled 2022-03-11: qty 28, 14d supply, fill #0

## 2022-03-11 NOTE — Progress Notes (Signed)
?  Subjective:  ?Patient ID: [name], male    DOB: Jun 24, 1951,   MRN: [id number] ? ?No chief complaint on file. ? ? ?71 y.o. male presents for concern of right heel wound that started about a week ago. Relates there was an odor. States was seen by PCP and told to keep ointment on there and keep dressed.   Denies any other pedal complaints. Denies n/v/f/c.  ? ?Past Medical History:  ?Diagnosis Date  ? A-fib [facility])   ? a. (06/04/14) TEE-DC-CV; succesful; large LA 6.2 cm  ? ADHD   ? Ascites   ? Athlete's foot   ? Chronic systolic heart failure ([place])   ? a. ECHO (05/2014): EF 25-30%, diff HK, RV midly dilated and sys fx mildly/mod reduced  ? CKD (chronic kidney disease) stage 3, GFR 30-59 ml/min (HCC)   ? Depression   ? Diabetes mellitus due to underlying condition with diabetic chronic kidney disease, unspecified CKD stage, unspecified whether long term insulin use ([place])   ? Heart murmur   ? HTN (hypertension)   ? OSA (obstructive sleep apnea)   ? PVD (peripheral vascular disease) ([place])   ? s/p great toe amputation  ? ? ?Objective:  ?Physical Exam: ?Vascular: DP/PT pulses 2/4 bilateral. CFT <3 seconds. Normal hair growth on digits. No edema.  ?Skin. No lacerations or abrasions bilateral feet. Right heel ulcer with granular base. Measures about about 1.5 cm x 2 cm x 0.5 cm with tendon exposed.Mild erythema and edema surrounding. No purulence.  ?Musculoskeletal: MMT 5/5 bilateral lower extremities in DF, PF, Inversion and Eversion. Deceased ROM in DF of ankle joint.  ?Neurological: Sensation intact to light touch.  ? ?Assessment:  ? ?1. Type 2 diabetes mellitus with stage 3b chronic kidney disease, with long-term current use of insulin ([place])   ?2. PVD (peripheral vascular disease) ([place])   ? ? ? ?Plan:  ?Patient was evaluated and treated and all questions answered. ?Ulcer right posterior ankle with muscle exposed  ?-Debridement as below. ?-Dressed with betadine, DSD. ?-Off-loading with surgical shoe. ?-No abx  indicated.  ?-Discussed glucose control and proper protein-rich diet.  ?-Discussed if any worsening redness, pain, fever or chills to call or may need to report to the emergency room. Patient expressed understanding.  ?Discussed with location of wound and possible infection and limited blood flow patient is at risk for limb loss. Expressed understanding.  ? ?Procedure: Excisional Debridement of Wound ?Rationale: Removal of non-viable soft tissue from the wound to promote healing.  ?Anesthesia: none ?Pre-Debridement Wound Measurements: 1cm x 1.5 cm x 0.2 cm  ?Post-Debridement Wound Measurements: 1.5 cm x 2 cm x 0.5 cm  ?Type of Debridement: Sharp Excisional ?Tissue Removed: Non-viable soft tissue ?Depth of Debridement: subcutaneous tissue. ?Technique: Sharp excisional debridement to bleeding, viable wound base.  ?Dressing: Dry, sterile, compression dressing. ?Disposition: Patient tolerated procedure well. Patient to return in 2 week for follow-up. ? ?Return in about 2 weeks (around 03/25/2022) for wound check. ? ? ?Lorenda Peck, DPM  ? ? ?

## 2022-03-12 ENCOUNTER — Other Ambulatory Visit (HOSPITAL_COMMUNITY): Payer: Self-pay

## 2022-03-19 ENCOUNTER — Ambulatory Visit (INDEPENDENT_AMBULATORY_CARE_PROVIDER_SITE_OTHER): Payer: Medicare HMO | Admitting: *Deleted

## 2022-03-19 ENCOUNTER — Telehealth: Payer: Self-pay

## 2022-03-19 DIAGNOSIS — Z5181 Encounter for therapeutic drug level monitoring: Secondary | ICD-10-CM

## 2022-03-19 DIAGNOSIS — I513 Intracardiac thrombosis, not elsewhere classified: Secondary | ICD-10-CM | POA: Diagnosis not present

## 2022-03-19 DIAGNOSIS — I483 Typical atrial flutter: Secondary | ICD-10-CM

## 2022-03-19 DIAGNOSIS — I4891 Unspecified atrial fibrillation: Secondary | ICD-10-CM

## 2022-03-19 DIAGNOSIS — L97401 Non-pressure chronic ulcer of unspecified heel and midfoot limited to breakdown of skin: Secondary | ICD-10-CM

## 2022-03-19 LAB — POCT INR: INR: 1.4 — AB (ref 2.0–3.0)

## 2022-03-19 NOTE — Telephone Encounter (Signed)
Please give specifics as to what supplies are needed. It is easiest to call pharmacy and have them make a refill request for supplies to our office. Unfortunately we do not write prescriptions for wound care supplies.

## 2022-03-19 NOTE — Patient Instructions (Signed)
Description   ?Take 1 tablet today and 1.5 tablets tomorrow and then continue to take 1/2 tablet daily except for 1 tablet on Mondays and Fridays. Recheck INR in 1 week. [facility] [phone].  ?Pt stopped taking amio on 3/7 ?  ?  ?  ?

## 2022-03-19 NOTE — Telephone Encounter (Signed)
Patients daughter called and stated that she needs insulin needle RX as well as one for wound care supplies for her father could not find in med list. Please advise. MLP ?

## 2022-03-20 ENCOUNTER — Other Ambulatory Visit (HOSPITAL_COMMUNITY): Payer: Self-pay

## 2022-03-20 NOTE — Telephone Encounter (Signed)
Attempt made to reach out with patients daughter to clarify order request was unable to leave message will call on Monday. ?

## 2022-03-23 NOTE — Addendum Note (Signed)
Addended byMarlowe Sax C on: 03/23/2022 04:28 PM ? ? Modules accepted: Orders ? ?

## 2022-03-23 NOTE — Telephone Encounter (Signed)
-   Refill Lantus as requested   ?- wound care referral ordered for evaluation of wound on the right heel.  ?

## 2022-03-24 NOTE — Telephone Encounter (Signed)
May refill insulin syringes as requested by pharmacy. ?

## 2022-03-25 ENCOUNTER — Encounter (HOSPITAL_COMMUNITY): Payer: Medicare HMO

## 2022-03-25 ENCOUNTER — Other Ambulatory Visit: Payer: Self-pay | Admitting: Podiatry

## 2022-03-25 DIAGNOSIS — L97413 Non-pressure chronic ulcer of right heel and midfoot with necrosis of muscle: Secondary | ICD-10-CM

## 2022-03-26 ENCOUNTER — Ambulatory Visit (INDEPENDENT_AMBULATORY_CARE_PROVIDER_SITE_OTHER): Payer: Medicare HMO | Admitting: *Deleted

## 2022-03-26 DIAGNOSIS — I513 Intracardiac thrombosis, not elsewhere classified: Secondary | ICD-10-CM | POA: Diagnosis not present

## 2022-03-26 DIAGNOSIS — I483 Typical atrial flutter: Secondary | ICD-10-CM | POA: Diagnosis not present

## 2022-03-26 DIAGNOSIS — I4891 Unspecified atrial fibrillation: Secondary | ICD-10-CM

## 2022-03-26 DIAGNOSIS — Z5181 Encounter for therapeutic drug level monitoring: Secondary | ICD-10-CM

## 2022-03-26 LAB — POCT INR: INR: 2.1 (ref 2.0–3.0)

## 2022-03-26 NOTE — Patient Instructions (Signed)
Description   Continue to take 1/2 tablet daily except for 1 tablet on Mondays and Fridays. Recheck INR in 3 weeks. [facility] [phone].  Pt stopped taking amio on 3/7

## 2022-04-01 ENCOUNTER — Ambulatory Visit (INDEPENDENT_AMBULATORY_CARE_PROVIDER_SITE_OTHER): Payer: Medicare HMO | Admitting: Podiatry

## 2022-04-01 ENCOUNTER — Encounter: Payer: Self-pay | Admitting: Podiatry

## 2022-04-01 DIAGNOSIS — E1122 Type 2 diabetes mellitus with diabetic chronic kidney disease: Secondary | ICD-10-CM

## 2022-04-01 DIAGNOSIS — L97413 Non-pressure chronic ulcer of right heel and midfoot with necrosis of muscle: Secondary | ICD-10-CM

## 2022-04-01 DIAGNOSIS — Z794 Long term (current) use of insulin: Secondary | ICD-10-CM

## 2022-04-01 DIAGNOSIS — I739 Peripheral vascular disease, unspecified: Secondary | ICD-10-CM

## 2022-04-01 DIAGNOSIS — N1832 Chronic kidney disease, stage 3b: Secondary | ICD-10-CM

## 2022-04-01 DIAGNOSIS — L97422 Non-pressure chronic ulcer of left heel and midfoot with fat layer exposed: Secondary | ICD-10-CM

## 2022-04-01 NOTE — Progress Notes (Signed)
  Subjective:  Patient ID: [name], male    DOB: 1951/08/16,   MRN: [id number]  No chief complaint on file.   71 y.o. male presents for follow-up of right heel wound. Relates they have been dressing  Has not had vascular studies they never received a call. Relates left foot ulcer has also developed on the lateral side and bottom of the heel that hurts more than last time.    Denies any other pedal complaints. Denies n/v/f/c.   Past Medical History:  Diagnosis Date   A-fib [facility])    a. (06/04/14) TEE-DC-CV; succesful; large LA 6.2 cm   ADHD    Ascites    Athlete's foot    Chronic systolic heart failure ([place])    a. 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 CFT <3 seconds. Normal hair growth on digits. No edema.  Skin. No lacerations or abrasions bilateral feet. Right heel ulcer with granular base. Measures about about 1.5 cm x 2 cm x 0.5 cm with tendon exposed.Necrosis over top with no erythema or edema noted No purulence.  Left fifth metatarsal wound measuirng about 0.5 cm x 0.5 cm x 0.2 cm with granular base and hyperkeratosis surrounding. No erythema edema or purulence noted.  Musculoskeletal: MMT 5/5 bilateral lower extremities in DF, PF, Inversion and Eversion. Deceased ROM in DF of ankle joint.  Neurological: Sensation intact to light touch.   Assessment:   1. Ulcer of right heel and midfoot with necrosis of muscle ([place])   2. Type 2 diabetes mellitus with stage 3b chronic kidney disease, with  long-term current use of insulin ([place])   3. PVD (peripheral vascular disease) ([place])       Plan:  Patient was evaluated and treated and all questions answered. Ulcer right posterior ankle with muscle exposed, necorsis. Left fifth metatarsal wound with fat layer exposed -Debridement as below to left.  -Dressed with betadine, DSD. -Off-loading with surgical shoe on left.  -No abx indicated.  -Reordered vascular studies. Referral to vascular placed.  -Discussed glucose control and proper protein-rich diet.  -Discussed if any worsening redness, pain, fever or chills to call or may need to report to the emergency room. Patient expressed understanding.  Discussed with location of wound and possible infection and limited blood flow patient is at risk for limb loss. Expressed understanding.   Procedure: Excisional Debridement of Wound Rationale: Removal of non-viable soft tissue from the wound to promote healing.  Anesthesia: none Pre-Debridement Wound Measurements: Overylying callus   Post-Debridement Wound Measurements: 0.5 cm x 0.5 cm x 0.2 cm  Type of Debridement: Sharp Excisional Tissue Removed: Non-viable soft tissue Depth of Debridement: subcutaneous tissue. Technique: Sharp excisional debridement to bleeding, viable wound base.  Dressing: Dry, sterile, compression dressing. Disposition: Patient tolerated procedure well. Patient to return in 2 week for follow-up.  No follow-ups on file.   Lorenda Peck, DPM

## 2022-04-07 ENCOUNTER — Other Ambulatory Visit (HOSPITAL_COMMUNITY): Payer: Self-pay | Admitting: *Deleted

## 2022-04-07 ENCOUNTER — Other Ambulatory Visit (HOSPITAL_COMMUNITY): Payer: Self-pay

## 2022-04-07 MED ORDER — SPIRONOLACTONE 25 MG PO TABS
25.0000 mg | ORAL_TABLET | ORAL | 3 refills | Status: DC
Start: 2022-04-07 — End: 2022-04-20
  Filled 2022-04-07: qty 15, 30d supply, fill #0

## 2022-04-10 ENCOUNTER — Other Ambulatory Visit (HOSPITAL_COMMUNITY): Payer: Self-pay | Admitting: Podiatry

## 2022-04-10 ENCOUNTER — Encounter (HOSPITAL_COMMUNITY): Payer: Medicare HMO

## 2022-04-10 DIAGNOSIS — I739 Peripheral vascular disease, unspecified: Secondary | ICD-10-CM

## 2022-04-15 ENCOUNTER — Ambulatory Visit (INDEPENDENT_AMBULATORY_CARE_PROVIDER_SITE_OTHER): Payer: Medicare HMO | Admitting: Podiatry

## 2022-04-15 ENCOUNTER — Encounter: Payer: Self-pay | Admitting: Podiatry

## 2022-04-15 DIAGNOSIS — Z794 Long term (current) use of insulin: Secondary | ICD-10-CM | POA: Diagnosis not present

## 2022-04-15 DIAGNOSIS — L84 Corns and callosities: Secondary | ICD-10-CM

## 2022-04-15 DIAGNOSIS — L97413 Non-pressure chronic ulcer of right heel and midfoot with necrosis of muscle: Secondary | ICD-10-CM

## 2022-04-15 DIAGNOSIS — E1122 Type 2 diabetes mellitus with diabetic chronic kidney disease: Secondary | ICD-10-CM

## 2022-04-15 DIAGNOSIS — N1832 Chronic kidney disease, stage 3b: Secondary | ICD-10-CM

## 2022-04-15 DIAGNOSIS — I739 Peripheral vascular disease, unspecified: Secondary | ICD-10-CM | POA: Diagnosis not present

## 2022-04-15 NOTE — Progress Notes (Signed)
  Subjective:  Patient ID: [name], male    DOB: [birth year],   MRN: [id number]  No chief complaint on file.   71 y.o. male presents for follow-up of right heel wound. Relates they have been dressing. Has appointment with vascular on Monday.  Relates he has been staying off his feet at home. Relates when he comes from appointments he does not wear any shoes only socks on both feet. Denies any new issues or concerns.    Denies any other pedal complaints. Denies n/v/f/c.   Past Medical History:  Diagnosis Date   A-fib [facility])    a. (06/04/14) TEE-DC-CV; succesful; large LA 6.2 cm   ADHD    Ascites    Athlete's foot    Chronic systolic heart failure ([place])    a. 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 CFT <3 seconds. Normal hair growth on digits. No edema.  Skin. No lacerations or abrasions bilateral feet. Right heel ulcer with granular base. Measures about about 3 cm x 2.5  cm x 0.5 cm with tendon exposed.Necrosis over top with no erythema or edema noted No purulence. Does look improved from prior.   Left fifth metatarsal wound healed with overlying hyperkeratosis No erythema edema or purulence noted.  Musculoskeletal: MMT 5/5 bilateral lower extremities in DF, PF, Inversion and Eversion. Deceased ROM in DF of ankle joint.  Neurological: Sensation intact to light touch.   Assessment:   1. Ulcer of right heel and midfoot with necrosis of muscle ([place])   2. Type 2 diabetes mellitus with stage 3b  chronic kidney disease, with long-term current use of insulin ([place])   3. PVD (peripheral vascular disease) ([place])   4. Pre-ulcerative calluses        Plan:  Patient was evaluated and treated and all questions answered. Ulcer right posterior ankle with muscle exposed, necorsis. Left fifth metatarsal wound -healed. -No debridement on right. Left hyperkeratoisis debrided without incident.  -Dressed with betadine, DSD. -Off-loading with surgical shoe on left. Discussed patient should be wearing this shoe as it will help prevent wounds and help keep things clean and free from infection.  Presented today with only socks on both feet.  -No abx indicated.  -Vascular appointment Monday.   -Discussed glucose control and proper protein-rich diet.  -Discussed if any worsening redness, pain, fever or chills to call or may need to report to the emergency room. Patient expressed understanding.  Discussed with location of wound and possible infection and limited blood flow patient is at risk for limb loss. Expressed understanding.    No follow-ups on file.   Lorenda Peck, DPM

## 2022-04-17 ENCOUNTER — Telehealth (HOSPITAL_COMMUNITY): Payer: Self-pay

## 2022-04-17 NOTE — Telephone Encounter (Signed)
Called and left patient a message to confirm/remind patient of their appointment at the [facility] on 04/20/22.

## 2022-04-20 ENCOUNTER — Ambulatory Visit (HOSPITAL_COMMUNITY)
Admission: RE | Admit: 2022-04-20 | Discharge: 2022-04-20 | Disposition: A | Discharge status: Home / Self Care | Payer: Medicare HMO | Source: Ambulatory Visit | Attending: Family Medicine | Admitting: Family Medicine

## 2022-04-20 ENCOUNTER — Ambulatory Visit (HOSPITAL_BASED_OUTPATIENT_CLINIC_OR_DEPARTMENT_OTHER)
Admission: RE | Admit: 2022-04-20 | Discharge: 2022-04-20 | Disposition: A | Discharge status: Home / Self Care | Payer: Medicare HMO | Source: Ambulatory Visit | Attending: Cardiology | Admitting: Cardiology

## 2022-04-20 ENCOUNTER — Encounter (HOSPITAL_COMMUNITY): Payer: Self-pay

## 2022-04-20 VITALS — BP 122/74 | HR 97 | Wt 208.2 lb

## 2022-04-20 DIAGNOSIS — Z7901 Long term (current) use of anticoagulants: Secondary | ICD-10-CM | POA: Insufficient documentation

## 2022-04-20 DIAGNOSIS — I5022 Chronic systolic (congestive) heart failure: Secondary | ICD-10-CM | POA: Diagnosis not present

## 2022-04-20 DIAGNOSIS — I130 Hypertensive heart and chronic kidney disease with heart failure and stage 1 through stage 4 chronic kidney disease, or unspecified chronic kidney disease: Secondary | ICD-10-CM | POA: Insufficient documentation

## 2022-04-20 DIAGNOSIS — I739 Peripheral vascular disease, unspecified: Secondary | ICD-10-CM

## 2022-04-20 DIAGNOSIS — G4733 Obstructive sleep apnea (adult) (pediatric): Secondary | ICD-10-CM | POA: Insufficient documentation

## 2022-04-20 DIAGNOSIS — E669 Obesity, unspecified: Secondary | ICD-10-CM | POA: Insufficient documentation

## 2022-04-20 DIAGNOSIS — Z79899 Other long term (current) drug therapy: Secondary | ICD-10-CM | POA: Diagnosis not present

## 2022-04-20 DIAGNOSIS — Z7984 Long term (current) use of oral hypoglycemic drugs: Secondary | ICD-10-CM | POA: Diagnosis not present

## 2022-04-20 DIAGNOSIS — E1122 Type 2 diabetes mellitus with diabetic chronic kidney disease: Secondary | ICD-10-CM

## 2022-04-20 DIAGNOSIS — N1831 Chronic kidney disease, stage 3a: Secondary | ICD-10-CM | POA: Diagnosis not present

## 2022-04-20 DIAGNOSIS — L97413 Non-pressure chronic ulcer of right heel and midfoot with necrosis of muscle: Secondary | ICD-10-CM

## 2022-04-20 DIAGNOSIS — L97419 Non-pressure chronic ulcer of right heel and midfoot with unspecified severity: Secondary | ICD-10-CM | POA: Insufficient documentation

## 2022-04-20 DIAGNOSIS — Z794 Long term (current) use of insulin: Secondary | ICD-10-CM | POA: Diagnosis not present

## 2022-04-20 DIAGNOSIS — F32A Depression, unspecified: Secondary | ICD-10-CM | POA: Diagnosis not present

## 2022-04-20 DIAGNOSIS — Z91148 Patient's other noncompliance with medication regimen for other reason: Secondary | ICD-10-CM | POA: Diagnosis not present

## 2022-04-20 DIAGNOSIS — I480 Paroxysmal atrial fibrillation: Secondary | ICD-10-CM | POA: Insufficient documentation

## 2022-04-20 DIAGNOSIS — N1832 Chronic kidney disease, stage 3b: Secondary | ICD-10-CM | POA: Diagnosis not present

## 2022-04-20 DIAGNOSIS — N183 Chronic kidney disease, stage 3 unspecified: Secondary | ICD-10-CM | POA: Diagnosis not present

## 2022-04-20 DIAGNOSIS — I8290 Acute embolism and thrombosis of unspecified vein: Secondary | ICD-10-CM | POA: Diagnosis not present

## 2022-04-20 DIAGNOSIS — I10 Essential (primary) hypertension: Secondary | ICD-10-CM

## 2022-04-20 DIAGNOSIS — E1151 Type 2 diabetes mellitus with diabetic peripheral angiopathy without gangrene: Secondary | ICD-10-CM | POA: Diagnosis not present

## 2022-04-20 DIAGNOSIS — F17290 Nicotine dependence, other tobacco product, uncomplicated: Secondary | ICD-10-CM | POA: Insufficient documentation

## 2022-04-20 LAB — BASIC METABOLIC PANEL
Anion gap: 5 (ref 5–15)
BUN: 26 mg/dL — ABNORMAL HIGH (ref 8–23)
CO2: 27 mmol/L (ref 22–32)
Calcium: 9 mg/dL (ref 8.9–10.3)
Chloride: 106 mmol/L (ref 98–111)
Creatinine, Ser: 1.62 mg/dL — ABNORMAL HIGH (ref 0.61–1.24)
GFR, Estimated: 45 mL/min — ABNORMAL LOW (ref >60)
Glucose, Bld: 174 mg/dL — ABNORMAL HIGH (ref 70–99)
Potassium: 4.3 mmol/L (ref 3.5–5.1)
Sodium: 138 mmol/L (ref 135–145)

## 2022-04-20 LAB — PROTIME-INR
INR: 1.8 — ABNORMAL HIGH (ref 0.8–1.2)
Prothrombin Time: 20.5 seconds — ABNORMAL HIGH (ref 11.4–15.2)

## 2022-04-20 MED ORDER — METOPROLOL SUCCINATE ER 25 MG PO TB24: 25.0000 mg | ORAL_TABLET | Freq: Every day | ORAL | 11 refills | Status: DC

## 2022-04-20 NOTE — Progress Notes (Addendum)
Patient ID: [name], male   DOB: 03/10/1951, 70 y.o.   MRN: [id number]    [facility] Note   PCP: [name]: [name]  HPI: [name] is a 71 y.o. male with PAF, severe depression, DM2, CKD stage III, obesity, OSA and chronic systolic HF.  Admitted 6/30-05/16/14 with dyspena and LE edema. Found to be in afib with RVR. Diuresed with IV lasix and d/c weight 234 lbs.   Admitted 2/18 -01/10/2016 wit volume overload/Afib RVR  in the setting of medication compliance. Diuresed with IV lasix and transitioned to lasix 80 mg twice a day. Had TEE but  DC-CV was cancelled due to LA thrombus. Discharge weight 189 pounds.   Admitted 03/14/2016 with  A fib RVR and volume overload in the setting of medication noncompliance. Refused cath, Myoview 12/2015. EF 20% with defects noted on rest and stress images within the inferior wall and to lesser extent the septum suggest ischemic etiology. No reversibility. Restarted on HF meds. D/C with volume overload. Psychiatry met with him and he was deemed to have the ability to make decisions. Diuresed with IV lasix and transitioned to lasix 80 mg twice a day. Discharge weight was 216 pounds.   Echo 5/17 EF 15-20%  Seen in the HF clinic in 2021. EF had normalized.    Admitted 10/28/21 with increased shortness of breath and marked volume overload.  Admit weight 231 pounds. His medications were being delivered in bubbles packs and he was taking metoprolol once a day instead of twice a day and his diuretic regimen was lower. Echo repeated EF 45-50%. Placed on IV lasix and diuresed > 19 liters. Transitioned to lasix 20 mg daily. Hospital course complicated by AKI and A fib. Underwent TEE-DC-CV with conversion to NSR. Beta blocker held due to bradycardia. He was continued on amiodarone 200 mg daily. He was back in A fib on discharge.  Discharge weight 203 pounds.   Admitted 12/11/21 after mechanical fall resulting  in rib fracture 5-7 and PTX.  AC stopped due to risk of fall. Discharged 12/15/21.   Follow up 2/23, NYHA II-III, volume ok. Warfarin resumed.  Today he returns for HF follow up with his daughter. Overall feeling fine. Able to get around the house without much dyspnea. No further falls. Denies palpitations, abnormal bleeding, CP, dizziness, edema, or PND/Orthopnea. Appetite ok. No fever or chills. Weight at home 208 pounds. Taking all medications. Daughter helps with medications. Followed by podiatry and [facility] for right heel ulcer.   Cardiac Testing  - TEE (12/22): EF 35% No PFO . No thrombus.  - DC-CV (12/22)--> NSR  - Echo (12/22): EF 45-50% - Echo (2021): EF 55-60 - Echo (5/17): EF 15-20%  - Myoview 2/17 Perfusion: Next defects noted on rest and stress images within the inferior wall and to lesser extent the septum. No reversible defects. Wall Motion: Severe generalized hypokinesia. Left ventricle is dilute Left Ventricular Ejection Fraction: 20 % End diastolic volume 426 ml End systolic volume 834 ml IMPRESSION: 1. 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 No evidence of ischemia. 2. Severe generalized hypokinesia with dilated left ventricle. 3. Left ventricular ejection fraction 20% 4. High-risk stress test findings*.  ROS: All systems negative except as listed in HPI, PMH and Problem List.  SH:  Social History   Socioeconomic History   Marital status: Single    Spouse name: Not on file   Number of children:  Not on file   Years of education: Not on file   Highest education level: Not on file  Occupational History   Occupation: retired  Tobacco Use   Smoking status: Some Days    Years: 52.00    Types: Cigarettes, Cigars    Last attempt to quit: 05/2021    Years since quitting: 0.9   Smokeless tobacco: Never  Vaping Use   Vaping Use: Never used  Substance and Sexual Activity   Alcohol use: Not Currently   Drug use: Never   Sexual  activity: Not on file  Other Topics Concern   Not on file  Social History Narrative   ** Merged History Encounter ** Lives in [place] by himself. Retired from [employer] and [employer] for [employer].       Tobacco use, amount per day now:   Past tobacco use, amount per day:   How many years did you use tobacco:   Alcohol use (drinks per week): N/A   Diet:   Do you drink/eat things with caffeine:   Marital status:  Divorced                                What year were you married? 2003   Do you live in a house, apartment, assisted living, condo, trailer, etc.? House   Is it one or more stories? One   How many persons live in your home?   Do you have pets in your home?( please list) N/A   Highest Level of education completed? Bachelors Degree   Current or past profession: Teacher-Special Ed   Do you exercise?  Yes                                Type and how often? Daily squats, and push ups.   Do you have a living will? No   Do you have a DNR form?  No                                 If not, do you want to discuss one?   Do you have signed POA/HPOA forms?  No                      If so, please bring to you appointment      Do you have any difficulty bathing or dressing yourself? Bathing only if pants not shirts/not shorts.   Do you have any difficulty preparing food or eating? No   Do you have any difficulty managing your medications? No   Do you have any difficulty managing your finances? No   Do you have any difficulty affording your medications? No   Social Determinants of Health   Financial Resource Strain: Not on file  Food Insecurity: No Food Insecurity (11/10/2021)   Hunger Vital Sign    Worried About Running Out of Food in the Last Year: Never true    Ran Out of Food in the Last Year: Never true  Transportation Needs: No Transportation Needs (11/10/2021)   PRAPARE - Hydrologist (Medical): No    Lack of Transportation (Non-Medical): No   Physical Activity: Not on file  Stress: Not on file  Social Connections: Not on file  Intimate Partner Violence:  Not on file   FH:  Family History  Problem Relation Age of Onset   Heart attack Mother        deceased   Diabetes Mother    Heart attack Sister    Past Medical History:  Diagnosis Date   A-fib [name])    a. (06/04/14) TEE-DC-CV; succesful; large LA 6.2 cm   ADHD    Ascites    Athlete's foot    Chronic systolic heart failure ([place])    a. 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, unspecified CKD stage, unspecified whether long term insulin use (HCC)    Heart murmur    HTN (hypertension)    OSA (obstructive sleep apnea)    PVD (peripheral vascular disease) (HCC)    s/p great toe amputation   Current Outpatient Medications  Medication Sig Dispense Refill   diclofenac Sodium (VOLTAREN) 1 % GEL Apply topically 4 (four) times daily as needed.     ENTRESTO 49-51 MG TAKE 1 TABLET BY MOUTH TWICE DAILY 180 tablet 2   furosemide (LASIX) 20 MG tablet Take 1 tablet by mouth 2 (two) times daily. May take an additional tablet daily if needed. 90 tablet 3   Green Tea, Camellia sinensis, (GREEN TEA EXTRACT PO) Take 1 Dose by mouth daily as needed.     insulin glargine (LANTUS) 100 UNIT/ML injection Inject 0.08 mLs (8 Units total) into the skin daily. 10 mL 1   JARDIANCE 10 MG TABS tablet TAKE 1 TABLET(10 MG) BY MOUTH DAILY 30 tablet 3   KRILL OIL OMEGA-3 PO Take 1 tablet by mouth daily. As needed     methocarbamol (ROBAXIN) 500 MG tablet Take 1 tablet (500 mg total) by mouth every 8 (eight) hours as needed for muscle spasms. 50 tablet 1   spironolactone (ALDACTONE) 25 MG tablet Patient takes 1/2 tablet by mouth every other day.     traMADol (ULTRAM) 50 MG tablet Take 1 tablet (50 mg  total) by mouth every 6 (six) hours as needed for moderate pain. 20 tablet 0   warfarin (COUMADIN) 6 MG tablet Take 6 mg by mouth daily. Monday/Friday.     warfarin (COUMADIN) 6 MG tablet Take 3 mg by mouth daily. Tuesday, Wednesday, Thursday, Saturday, and Sunday.     blood glucose meter kit and supplies Dispense based on patient and insurance preference. Use up to four times daily as directed. (FOR ICD-10 E10.9, E11.9). (Patient not taking: Reported on 04/20/2022) 1 each 0   No current facility-administered medications for this encounter.   BP 122/74   Pulse 97   Wt 94.4 kg (208 lb 3.2 oz)   SpO2 97%   BMI 31.66 kg/m   Wt Readings from Last 3 Encounters:  04/20/22 94.4 kg (208 lb 3.2 oz)  03/03/22 96 kg (211 lb 9.6 oz)  12/26/21 96.3 kg (212 lb 3.2 oz)   PHYSICAL EXAM: General:  NAD. No resp difficulty HEENT: Normal Neck: Supple. No JVD. Carotids 2+ bilat; no bruits. No lymphadenopathy or thryomegaly appreciated. Cor: PMI nondisplaced. Regular rate & rhythm. No rubs, gallops or murmurs. Lungs: Clear Abdomen: Soft, nontender, nondistended. No hepatosplenomegaly. No bruits or masses. Good bowel sounds. Extremities: No cyanosis, clubbing, rash, edema Neuro: Alert & oriented x 3, cranial nerves grossly intact. Moves all  4 extremities w/o difficulty. Tangential thoughts and speech  ECG (personally reviewed): ? Ectopic atrial tachycardia, PR 254 msec, HR 107 bpm  ASSESSMENT & PLAN:  1. Chronic HFmEF - Admitted with marked volume overload. Not taking medications as prescribed. - Myoview 12/2015. EF 20% with defects noted on rest and stress images within the inferior wall and to lesser extent the septum suggest ischemic etiology. No reversibility. Refused cath multiple times. No s/s angina currently. HsTrop not checked on admit. ECG with nonspecific inferolateral TW abnormalities - Echo (10/21): EF 55-60%. - Echo (12/22): EF 45-50%, RV moderately reduced, RVSP 85 mmHg - NYHA II-III.  Volume status stable.  - Restart Toprol XL 25 mg daily. - Continue Lasix 20 mg bid. - Continue Entresto 49/51 mg bid. - Continue Jardiance 10 mg daily. - Continue spironolactone 12.5 mg daily. - BMET today.   2. CKD Stage IIIa-IIIb - Baseline Creatinine 1.5-1.7  - Labs today.   3. DMII - Hgb A1C 6.8. On SSI - Continue Jardiance   4. HTN - Stable. - Continue current meds.   5. 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  - No further falls. - Continue warfarin. Check INR today, will forward to Cou[facility] Clinic. - Atrial tach vs sinus tachycardia on ECG today, HR 107 - Restart Toprol as above.   6.  H/o LV Thrombus - Resolved - On warfarin for AF/AFL  7. PVD - Followed by vascular and podiatry for right heel wound - ABIs (6/23) R moderate, L mild  Follow up in 3-4 months with Dr. Haroldine Laws. I advised discussing OTC supplements with pharmacist.  Maricela Bo [name] FNP-BC  4:14 PM

## 2022-04-20 NOTE — Patient Instructions (Addendum)
START Toprol XL 25 mg, one tab daily   Labs today We will only contact you if something comes back abnormal or we need to make some changes. Otherwise no news is good news!  Your physician recommends that you schedule a follow-up appointment in: 3-4 months with Dr Gala Romney  Do the following things EVERYDAY: Weigh yourself in the morning before breakfast. Write it down and keep it in a log. Take your medicines as prescribed Eat low salt foods--Limit salt (sodium) to 2000 mg per day.  Stay as active as you can everyday Limit all fluids for the day to less than 2 liters  At the [facility], you and your health needs are our priority. As part of our continuing mission to provide you with exceptional heart care, we have created designated Provider Care Teams. These Care Teams include your primary Cardiologist (physician) and Advanced Practice Providers (APPs- Physician Assistants and Nurse Practitioners) who all work together to provide you with the care you need, when you need it.   You may see any of the following providers on your designated Care Team at your next follow up: Dr Arvilla Meres Dr Carron Curie, NP Robbie Lis, [place] [facility] [place], [place] Karle Plumber, PharmD   Please be sure to bring in all your medications bottles to every appointment.

## 2022-04-21 ENCOUNTER — Ambulatory Visit (INDEPENDENT_AMBULATORY_CARE_PROVIDER_SITE_OTHER): Payer: Medicare HMO | Admitting: *Deleted

## 2022-04-21 DIAGNOSIS — Z5181 Encounter for therapeutic drug level monitoring: Secondary | ICD-10-CM

## 2022-04-21 DIAGNOSIS — I513 Intracardiac thrombosis, not elsewhere classified: Secondary | ICD-10-CM | POA: Diagnosis not present

## 2022-04-21 DIAGNOSIS — I483 Typical atrial flutter: Secondary | ICD-10-CM

## 2022-04-21 NOTE — Patient Instructions (Signed)
Description   Spoke with Dtr, [name] (on DPR), and advised pt to take 1 tablet today then continue to take 1/2 tablet daily except for 1 tablet on Mondays and Fridays. Recheck INR in 3 weeks. [facility] [phone].  Pt stopped taking amio on 3/7

## 2022-04-22 ENCOUNTER — Telehealth: Payer: Self-pay

## 2022-04-22 ENCOUNTER — Ambulatory Visit: Payer: Medicare HMO | Admitting: Podiatry

## 2022-04-22 NOTE — Telephone Encounter (Signed)
Pt c/o medication issue:  1. Name of Medication:  warfarin (COUMADIN) 6 MG tablet  2. How are you currently taking this medication (dosage and times per day)?   3. Are you having a reaction (difficulty breathing--STAT)?   4. What is your medication issue?    Patient's daughter called in requesting to speak with [facility]. She explained that the patient is refusing to take his Coumadin as advised. She states yesterday she spoke with someone from the [facility] who advised on how he needs to be taking his medication. She is requesting to have someone speak with the patient to explain the importance of taking this medication. I called [facility], but no answer. Patient advised me she would also try calling.

## 2022-04-22 NOTE — Telephone Encounter (Signed)
I called pt and explained the importance of taking Warfarin and the risk if he misses any doses (blood clot/stroke). Pt very agitated and responded rudely stating "he never follows the dose changes anyway". I educated pt on the meaning of a "low" INR reading and "high" INR reading and the reasoning behind taking 1 tablet yesterday due to INR of 1.8. Pt rudely stated "okay" and hung up. Follow-up anticoagulation appt on 05/13/22.

## 2022-04-29 ENCOUNTER — Ambulatory Visit (INDEPENDENT_AMBULATORY_CARE_PROVIDER_SITE_OTHER): Payer: Medicare HMO | Admitting: Podiatry

## 2022-04-29 ENCOUNTER — Encounter: Payer: Self-pay | Admitting: Podiatry

## 2022-04-29 DIAGNOSIS — E1122 Type 2 diabetes mellitus with diabetic chronic kidney disease: Secondary | ICD-10-CM

## 2022-04-29 DIAGNOSIS — N1832 Chronic kidney disease, stage 3b: Secondary | ICD-10-CM

## 2022-04-29 DIAGNOSIS — Z794 Long term (current) use of insulin: Secondary | ICD-10-CM | POA: Diagnosis not present

## 2022-04-29 DIAGNOSIS — I739 Peripheral vascular disease, unspecified: Secondary | ICD-10-CM

## 2022-04-29 DIAGNOSIS — L84 Corns and callosities: Secondary | ICD-10-CM

## 2022-04-29 DIAGNOSIS — L97413 Non-pressure chronic ulcer of right heel and midfoot with necrosis of muscle: Secondary | ICD-10-CM

## 2022-04-29 NOTE — Progress Notes (Signed)
Subjective:  Patient ID: [name], male    DOB: 05-Dec-1950,   MRN: [id number]  Chief Complaint  Patient presents with   Wound Check    Bilateral wound check     71 y.o. male presents for follow-up of right heel wound. Relates they have been dressing. Has had ABIS done. Has not schedulled appointment with [facility].Relates he has been staying off his feet at home. Relates when he comes from appointments he does not wear any shoes only socks on both feet. Denies any new issues or concerns.    Denies any other pedal complaints. Denies n/v/f/c.   Past Medical History:  Diagnosis Date   A-fib [facility])    a. (06/04/14) TEE-DC-CV; succesful; large LA 6.2 cm   ADHD    Ascites    Athlete's foot    Chronic systolic heart failure (HCC)    a. 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 CFT <3 seconds. Normal hair growth on digits. No edema.  Skin. No lacerations or abrasions bilateral feet. Right heel ulcer with granular base. Measures about about 3 cm x 2.5  cm x 0.5 cm with tendon exposed.Necrosis over top with no erythema or edema noted No purulence. Does look improved from prior.   Left fifth metatarsal wound healed with overlying hyperkeratosis No erythema edema or purulence noted.  Musculoskeletal: MMT 5/5 bilateral lower extremities in DF, PF, Inversion and Eversion. Deceased ROM in DF of ankle joint.  Neurological: Sensation intact to light touch.   ABIs:   Summary:  Right: Resting right ankle-brachial  index indicates moderate right lower  extremity arterial disease. The right toe-brachial index is abnormal.   Left: Resting left ankle-brachial index indicates mild left lower  extremity arterial disease. The left toe-brachial index is abnormal.   Assessment:   1. Type 2 diabetes mellitus with stage 3b chronic kidney disease, with long-term current use of insulin (HCC)   2. Ulcer of right heel and midfoot with necrosis of muscle (HCC)   3. PVD (peripheral vascular disease) (HCC)   4. Pre-ulcerative calluses         Plan:  Patient was evaluated and treated and all questions answered. Ulcer right posterior ankle with muscle exposed, necorsis. Left fifth metatarsal wound -healed. -No debridement on right. Left hyperkeratoisis debrided without incident.  -Dressed with betadine, DSD. -Off-loading with surgical shoe on left. Discussed patient should be wearing this shoe as it will help prevent wounds and help keep things clean and free from infection.  Presented today with slip on sandals.  -No abx indicated.  -followed with cardiology but has not been  to see [facility]. Advised to call to get in now that ABIs are done.   -Discussed glucose control and proper protein-rich diet.  -Discussed if any worsening redness, pain, fever or chills to call or may need to report to the emergency room. Patient expressed understanding.  Discussed with location of wound and possible infection and limited blood flow patient  is at risk for limb loss. Expressed understanding.  Follow-up in 3 weeks.    No follow-ups on file.   Louann Sjogren, DPM

## 2022-05-09 ENCOUNTER — Other Ambulatory Visit: Payer: Self-pay | Admitting: Internal Medicine

## 2022-05-09 MED ORDER — FUROSEMIDE 20 MG PO TABS: 20.0000 mg | ORAL_TABLET | Freq: Two times a day (BID) | ORAL | 0 refills | Status: DC

## 2022-05-18 ENCOUNTER — Ambulatory Visit (INDEPENDENT_AMBULATORY_CARE_PROVIDER_SITE_OTHER): Payer: Medicare HMO

## 2022-05-18 DIAGNOSIS — I4891 Unspecified atrial fibrillation: Secondary | ICD-10-CM

## 2022-05-18 DIAGNOSIS — I483 Typical atrial flutter: Secondary | ICD-10-CM

## 2022-05-18 DIAGNOSIS — I513 Intracardiac thrombosis, not elsewhere classified: Secondary | ICD-10-CM

## 2022-05-18 DIAGNOSIS — Z5181 Encounter for therapeutic drug level monitoring: Secondary | ICD-10-CM

## 2022-05-18 LAB — POCT INR: INR: 1.7 — AB (ref 2.0–3.0)

## 2022-05-18 NOTE — Patient Instructions (Signed)
Description   Take an extra 1/2 tablet today and then continue to take 1/2 tablet daily except for 1 tablet on Mondays and Fridays. Recheck INR in 3 weeks. [facility] [phone].  Pt stopped taking amio on 3/7

## 2022-05-18 NOTE — Progress Notes (Deleted)
HISTORY AND PHYSICAL     CC:  follow up. Requesting Provider:  Sandrea Hughs, NP  HPI: This is a 71 y.o. male who is here today for follow up for PAD.  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  He had been having pain and blistering for about 7 weeks that had progressed.  It was frankly necrotic with maggot infestation.  He did not have any claudication or rest pain.     On May 19, 2021, he underwent left peroneal angioplasty via right CFA by Dr. Carlis Abbott.  On May 22, 2021, he underwent left 5th toe amputation also by Dr. Carlis Abbott.    Pt was last seen 07/28/2021 and at that time, he was doing well as his toe amputation site had healed and his sutures were removed.  His ABI had improved and bypass without stenosis.  He was to continue to wash his feet daily with dial soap and water daily and moisturize to keep skin from cracking and recommended podiatry for nail trims.  It was recommended for leg elevation and low grade compression knee socks and f/u in 6 months.    Pt was seen in June 2023 by podiatry and now has a right heel wound.  Per their note, he did have an ulcer on the right posterior ankle with muscle exposed.  He did have ABI and the right was 0.75, which was down from 0.96 and his TBI and toe pressure was zero.  His left ABI was 0.91.  His arterial duplex revealed Right: Medial calcification in the tibial arteries.  High grade stenosis vs. occlusion of the PTA; collaterals noted. Left: Medial calcification in the tibial arteries.  High grade stenosis vs. occlusion of the tibial arteries; collaterals noted.  The pt returns today for follow up.  ***  The pt *** on a statin for cholesterol management.    The pt *** on an aspirin.    Other AC:  coumadin The pt is on BB, diuretic for hypertension.  The pt does  have diabetes. Tobacco hx:  ***    Past Medical History:  Diagnosis Date   A-fib ([place])    a. (06/04/14) TEE-DC-CV; succesful; large LA  6.2 cm   ADHD    Ascites    Athlete's foot    Chronic systolic heart failure ([place])    a. 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, unspecified CKD stage, unspecified whether long term insulin use (HCC)    Heart murmur    HTN (hypertension)    OSA (obstructive sleep apnea)    PVD (peripheral vascular disease) (HCC)    s/p great toe amputation    Past Surgical History:  Procedure Laterality Date   ABDOMINAL AORTOGRAM W/LOWER EXTREMITY N/A 05/19/2021   Procedure: ABDOMINAL AORTOGRAM W/LOWER EXTREMITY;  Surgeon: Marty Heck, MD;  Location: [facility];  Service: Cardiovascular;  Laterality: N/A;   AMPUTATION Left 05/22/2021   Procedure: LEFT FIFTH TOE AMPUTATION;  Surgeon: Marty Heck, MD;  Location: [facility];  Service: Vascular;  Laterality: Left;   CARDIOVERSION N/A 06/04/2014   Procedure: CARDIOVERSION;  Surgeon: Larey Dresser, MD;  Location: [facility];  Service: Cardiovascular;  Laterality: N/A;   CARDIOVERSION N/A  11/06/2021   Procedure: CARDIOVERSION;  Surgeon: Larey Dresser, MD;  Location: [facility];  Service: Cardiovascular;  Laterality: N/A;   NOSE SURGERY     Nasal septum surgery   PERIPHERAL VASCULAR INTERVENTION Left 05/19/2021   Procedure: PERIPHERAL VASCULAR INTERVENTION;  Surgeon: Marty Heck, MD;  Location: [facility];  Service: Cardiovascular;  Laterality: Left;   TEE WITHOUT CARDIOVERSION N/A 06/04/2014   Procedure: TRANSESOPHAGEAL ECHOCARDIOGRAM (TEE);  Surgeon: Larey Dresser, MD;  Location: [facility];  Service: Cardiovascular;  Laterality: N/A;   TEE WITHOUT CARDIOVERSION N/A 01/06/2016   Procedure: TRANSESOPHAGEAL ECHOCARDIOGRAM (TEE);  Surgeon: Fay Records, MD;  Location: [facility];  Service: Cardiovascular;   Laterality: N/A;   TEE WITHOUT CARDIOVERSION N/A 11/06/2021   Procedure: TRANSESOPHAGEAL ECHOCARDIOGRAM (TEE);  Surgeon: Larey Dresser, MD;  Location: [facility];  Service: Cardiovascular;  Laterality: N/A;   VASECTOMY      No Known Allergies  Current Outpatient Medications  Medication Sig Dispense Refill   blood glucose meter kit and supplies Dispense based on patient and insurance preference. Use up to four times daily as directed. (FOR ICD-10 E10.9, E11.9). (Patient not taking: Reported on 04/20/2022) 1 each 0   diclofenac Sodium (VOLTAREN) 1 % GEL Apply topically 4 (four) times daily as needed.     ENTRESTO 49-51 MG TAKE 1 TABLET BY MOUTH TWICE DAILY 180 tablet 2   furosemide (LASIX) 20 MG tablet Take 1 tablet by mouth 2 (two) times daily. May take an additional tablet daily if needed. 90 tablet 0   Green Tea, Camellia sinensis, (GREEN TEA EXTRACT PO) Take 1 Dose by mouth daily as needed.     insulin glargine (LANTUS) 100 UNIT/ML injection Inject 0.08 mLs (8 Units total) into the skin daily. 10 mL 1   JARDIANCE 10 MG TABS tablet TAKE 1 TABLET(10 MG) BY MOUTH DAILY 30 tablet 3   KRILL OIL OMEGA-3 PO Take 1 tablet by mouth daily. As needed     methocarbamol (ROBAXIN) 500 MG tablet Take 1 tablet (500 mg total) by mouth every 8 (eight) hours as needed for muscle spasms. 50 tablet 1   metoprolol succinate (TOPROL XL) 25 MG 24 hr tablet Take 1 tablet (25 mg total) by mouth daily. 30 tablet 11   spironolactone (ALDACTONE) 25 MG tablet Patient takes 1/2 tablet by mouth every other day.     traMADol (ULTRAM) 50 MG tablet Take 1 tablet (50 mg total) by mouth every 6 (six) hours as needed for moderate pain. 20 tablet 0   warfarin (COUMADIN) 6 MG tablet Take 6 mg by mouth daily. Monday/Friday.     warfarin (COUMADIN) 6 MG tablet Take 3 mg by mouth daily. Tuesday, Wednesday, Thursday, Saturday, and Sunday.     No current facility-administered medications for this visit.    Family History   Problem Relation Age of Onset   Heart attack Mother        deceased   Diabetes Mother    Heart attack Sister     Social History   Socioeconomic History   Marital status: Single    Spouse name: Not on file   Number of children: Not on file   Years of education: Not on file   Highest education level: Not on file  Occupational History   Occupation: retired  Tobacco Use   Smoking status: Some Days    Years: 52.00    Types: Cigarettes, Cigars    Last attempt to quit: 05/2021  Years since quitting: 1.0   Smokeless tobacco: Never  Vaping Use   Vaping Use: Never used  Substance and Sexual Activity   Alcohol use: Not Currently   Drug use: Never   Sexual activity: Not on file  Other Topics Concern   Not on file  Social History Narrative   ** Merged History Encounter ** Lives in [place] by himself. Retired from [employer] and [employer] for [employer].       Tobacco use, amount per day now:   Past tobacco use, amount per day:   How many years did you use tobacco:   Alcohol use (drinks per week): N/A   Diet:   Do you drink/eat things with caffeine:   Marital status:  Divorced                                What year were you married? 2003   Do you live in a house, apartment, assisted living, condo, trailer, etc.? House   Is it one or more stories? One   How many persons live in your home?   Do you have pets in your home?( please list) N/A   Highest Level of education completed? Bachelors Degree   Current or past profession: Teacher-Special Ed   Do you exercise?  Yes                                Type and how often? Daily squats, and push ups.   Do you have a living will? No   Do you have a DNR form?  No                                 If not, do you want to discuss one?   Do you have signed POA/HPOA forms?  No                      If so, please bring to you appointment      Do you have any difficulty bathing or dressing yourself? Bathing only if pants not  shirts/not shorts.   Do you have any difficulty preparing food or eating? No   Do you have any difficulty managing your medications? No   Do you have any difficulty managing your finances? No   Do you have any difficulty affording your medications? No   Social Determinants of Health   Financial Resource Strain: Not on file  Food Insecurity: No Food Insecurity (11/10/2021)   Hunger Vital Sign    Worried About Running Out of Food in the Last Year: Never true    Ran Out of Food in the Last Year: Never true  Transportation Needs: No Transportation Needs (11/10/2021)   PRAPARE - Hydrologist (Medical): No    Lack of Transportation (Non-Medical): No  Physical Activity: Not on file  Stress: Not on file  Social Connections: Not on file  Intimate Partner Violence: Not on file     REVIEW OF SYSTEMS:  *** '[X]'  denotes positive finding, '[ ]'  denotes negative finding Cardiac  Comments:  Chest pain or chest pressure:    Shortness of breath upon exertion:    Short of breath when lying flat:    Irregular heart rhythm:  Vascular    Pain in calf, thigh, or hip brought on by ambulation:    Pain in feet at night that wakes you up from your sleep:     Blood clot in your veins:    Leg swelling:         Pulmonary    Oxygen at home:    Productive cough:     Wheezing:         Neurologic    Sudden weakness in arms or legs:     Sudden numbness in arms or legs:     Sudden onset of difficulty speaking or slurred speech:    Temporary loss of vision in one eye:     Problems with dizziness:         Gastrointestinal    Blood in stool:     Vomited blood:         Genitourinary    Burning when urinating:     Blood in urine:        Psychiatric    Major depression:         Hematologic    Bleeding problems:    Problems with blood clotting too easily:        Skin    Rashes or ulcers:        Constitutional    Fever or chills:      PHYSICAL  EXAMINATION:  ***  General:  WDWN in NAD; vital signs documented above Gait: Not observed HENT: WNL, normocephalic Pulmonary: normal non-labored breathing , without wheezing Cardiac: {Desc; regular/irreg:14544} HR, {With/Without:20273} carotid bruit*** Abdomen: soft, NT, no masses; aortic pulse is *** palpable Skin: {With/Without:20273} rashes Vascular Exam/Pulses:  Right Left  Radial {Exam; arterial pulse strength 0-4:30167} {Exam; arterial pulse strength 0-4:30167}  Femoral {Exam; arterial pulse strength 0-4:30167} {Exam; arterial pulse strength 0-4:30167}  Popliteal {Exam; arterial pulse strength 0-4:30167} {Exam; arterial pulse strength 0-4:30167}  DP {Exam; arterial pulse strength 0-4:30167} {Exam; arterial pulse strength 0-4:30167}  PT {Exam; arterial pulse strength 0-4:30167} {Exam; arterial pulse strength 0-4:30167}  Peroneal *** ***   Extremities: {With/Without:20273} ischemic changes, {With/Without:20273} Gangrene , {With/Without:20273} cellulitis; {With/Without:20273} open wounds Musculoskeletal: no muscle wasting or atrophy  Neurologic: A&O X 3 Psychiatric:  The pt has {Desc; normal/abnormal:11317::"Normal"} affect.   Non-Invasive Vascular Imaging:   ABI's/TBI's on 04/20/2022: Right:  0.75/0 - Great toe pressure: 0 Left:  0.91/0.29 - Great toe pressure: 55  Arterial duplex on 04/20/2022: +-----------+--------+-----+--------+----------+--------+  RIGHT      PSV cm/sRatioStenosisWaveform  Comments  +-----------+--------+-----+--------+----------+--------+  CFA Prox   86                   triphasic           +-----------+--------+-----+--------+----------+--------+  CFA Distal 57                   triphasic           +-----------+--------+-----+--------+----------+--------+  DFA        42                   triphasic           +-----------+--------+-----+--------+----------+--------+  SFA Prox   75                   triphasic            +-----------+--------+-----+--------+----------+--------+  SFA Mid    63  triphasic           +-----------+--------+-----+--------+----------+--------+  SFA Distal 75                   triphasic           +-----------+--------+-----+--------+----------+--------+  POP Prox   80                   triphasic           +-----------+--------+-----+--------+----------+--------+  POP Distal 114                  triphasic           +-----------+--------+-----+--------+----------+--------+  TP Trunk   95                   triphasic           +-----------+--------+-----+--------+----------+--------+  ATA Prox   109                  monophasic          +-----------+--------+-----+--------+----------+--------+  ATA Mid    65                   monophasic          +-----------+--------+-----+--------+----------+--------+  ATA Distal 47                   monophasic          +-----------+--------+-----+--------+----------+--------+  PTA Prox   96                   monophasic          +-----------+--------+-----+--------+----------+--------+  PTA Mid    33                   monophasic          +-----------+--------+-----+--------+----------+--------+  PTA Distal 27                   monophasic          +-----------+--------+-----+--------+----------+--------+  PERO Prox  47                   monophasic          +-----------+--------+-----+--------+----------+--------+  PERO Mid   75                   monophasic          +-----------+--------+-----+--------+----------+--------+  PERO Distal74                   monophasic          +-----------+--------+-----+--------+----------+--------+   +-----------+--------+-----+--------+----------+--------+  LEFT       PSV cm/sRatioStenosisWaveform  Comments  +-----------+--------+-----+--------+----------+--------+  CFA Prox   90                    triphasic           +-----------+--------+-----+--------+----------+--------+  CFA Distal 80                   triphasic           +-----------+--------+-----+--------+----------+--------+  DFA        52                   triphasic           +-----------+--------+-----+--------+----------+--------+  SFA Prox   67  triphasic           +-----------+--------+-----+--------+----------+--------+  SFA Mid    53                   triphasic           +-----------+--------+-----+--------+----------+--------+  SFA Distal 65                   triphasic           +-----------+--------+-----+--------+----------+--------+  POP Prox   70                   triphasic           +-----------+--------+-----+--------+----------+--------+  POP Distal 132                  triphasic           +-----------+--------+-----+--------+----------+--------+  ATA Prox   63                   monophasic          +-----------+--------+-----+--------+----------+--------+  ATA Mid    131                  monophasic          +-----------+--------+-----+--------+----------+--------+  ATA Distal 82                   monophasic          +-----------+--------+-----+--------+----------+--------+  PTA Prox                occluded                    +-----------+--------+-----+--------+----------+--------+  PTA Mid                 occluded                    +-----------+--------+-----+--------+----------+--------+  PTA Distal 11                   monophasic          +-----------+--------+-----+--------+----------+--------+  PERO Prox  63                   monophasic          +-----------+--------+-----+--------+----------+--------+  PERO Mid                occluded                    +-----------+--------+-----+--------+----------+--------+  PERO Distal110                  monophasic           +-----------+--------+-----+--------+----------+--------+   Summary:  Right: Medial calcification in the tibial arteries. High grade stenosis vs. occlusion of the PTA; collaterals noted.   Left: Medial calcification in the tibial arteries. High grade stenosis vs. occlusion of the tibial arteries; collaterals noted.   Previous ABI's/TBI's on 07/25/2021: Right:  0.96/0.40 - Great toe pressure: 72 Left:  1.03/0.52 - Great toe pressure:  94  Previous arterial duplex on 07/25/2021: +-----------+--------+-----+--------+----------+--------+  LEFT       PSV cm/sRatioStenosisWaveform  Comments  +-----------+--------+-----+--------+----------+--------+  CFA Distal 125                  biphasic            +-----------+--------+-----+--------+----------+--------+  DFA  52                   biphasic            +-----------+--------+-----+--------+----------+--------+  SFA Prox   72                   triphasic           +-----------+--------+-----+--------+----------+--------+  SFA Mid    85                   triphasic           +-----------+--------+-----+--------+----------+--------+  SFA Distal 62                   triphasic           +-----------+--------+-----+--------+----------+--------+  POP Prox   56                   triphasic           +-----------+--------+-----+--------+----------+--------+  POP Distal 100                  triphasic           +-----------+--------+-----+--------+----------+--------+  ATA Distal 30                   monophasic          +-----------+--------+-----+--------+----------+--------+  PTA Distal 39                   monophasic          +-----------+--------+-----+--------+----------+--------+  PERO Prox  141                  biphasic            +-----------+--------+-----+--------+----------+--------+  PERO Mid   40                   biphasic             +-----------+--------+-----+--------+----------+--------+  PERO Distal55                   monophasic          +-----------+--------+-----+--------+----------+--------+   Summary:  Left: No obvious stenosis noted in the peroneal artery. No hemodynamically significant stenosis noted in the left lower extremity.    ASSESSMENT/PLAN:: 71 y.o. male here for follow up for PAD with hx of angiogram with left peroneal angioplasty on 05/19/2021 by Dr. Carlis Abbott who presents today for ***  -*** -continue *** -pt will f/u in *** with ***.   Leontine Locket, [facility] [phone]  Clinic MD:   Carlis Abbott

## 2022-05-25 ENCOUNTER — Telehealth (HOSPITAL_COMMUNITY): Payer: Self-pay | Admitting: *Deleted

## 2022-05-25 NOTE — Telephone Encounter (Signed)
Pts daughter left vm requesting call about pts meds. I called her back no answer.

## 2022-05-26 ENCOUNTER — Ambulatory Visit: Payer: Medicare HMO

## 2022-05-27 ENCOUNTER — Ambulatory Visit (INDEPENDENT_AMBULATORY_CARE_PROVIDER_SITE_OTHER): Payer: Medicare HMO | Admitting: Podiatry

## 2022-05-27 ENCOUNTER — Encounter: Payer: Self-pay | Admitting: Podiatry

## 2022-05-27 DIAGNOSIS — L97413 Non-pressure chronic ulcer of right heel and midfoot with necrosis of muscle: Secondary | ICD-10-CM

## 2022-05-27 DIAGNOSIS — I739 Peripheral vascular disease, unspecified: Secondary | ICD-10-CM | POA: Diagnosis not present

## 2022-05-27 DIAGNOSIS — E1122 Type 2 diabetes mellitus with diabetic chronic kidney disease: Secondary | ICD-10-CM | POA: Diagnosis not present

## 2022-05-27 DIAGNOSIS — L97422 Non-pressure chronic ulcer of left heel and midfoot with fat layer exposed: Secondary | ICD-10-CM | POA: Diagnosis not present

## 2022-05-27 DIAGNOSIS — Z794 Long term (current) use of insulin: Secondary | ICD-10-CM | POA: Diagnosis not present

## 2022-05-27 DIAGNOSIS — N1832 Chronic kidney disease, stage 3b: Secondary | ICD-10-CM | POA: Diagnosis not present

## 2022-05-27 NOTE — Progress Notes (Signed)
Subjective:  Patient ID: [name], male    DOB: 1951/07/02,   MRN: [id number]  No chief complaint on file.   71 y.o. male presents for follow-up of right heel wound. Relates they have been dressing. Has had ABIS done. Had appointment on 7/18 with vascular but relates he cancelled the appointment as he was not sure what they were going to do .Relates he has been staying off his feet at home. Relates when he comes from appointments he does not wear any shoes only socks on both feet. In socks today and relates the left foot wound is open again.  Denies any new issues or concerns.    Denies any other pedal complaints. Denies n/v/f/c.   Past Medical History:  Diagnosis Date   A-fib [facility])    a. (06/04/14) TEE-DC-CV; succesful; large LA 6.2 cm   ADHD    Ascites    Athlete's foot    Chronic systolic heart failure (HCC)    a. 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 CFT <3 seconds. Normal hair growth on digits. No edema.  Skin. No lacerations or abrasions bilateral feet. Right heel ulcer with granular base. Measures about about 3 cm x 2.5  cm x 0.5 cm with tendon exposed.Necrosis over top with no erythema or edema noted No purulence. Does look improved from prior.   Left fifth metatarsal wound  1.5 cm x 1 cm x 0.3 cm. No erythema edema or purulence noted.  Musculoskeletal: MMT 5/5 bilateral lower extremities in DF, PF, Inversion and Eversion. Deceased ROM in DF of ankle joint.  Neurological: Sensation  intact to light touch.   ABIs:   Summary:  Right: Resting right ankle-brachial index indicates moderate right lower  extremity arterial disease. The right toe-brachial index is abnormal.   Left: Resting left ankle-brachial index indicates mild left lower  extremity arterial disease. The left toe-brachial index is abnormal.   Assessment:   1. Ulcer of right heel and midfoot with necrosis of muscle (HCC)   2. PVD (peripheral vascular disease) (HCC)   3. Type 2 diabetes mellitus with stage 3b chronic kidney disease, with long-term current use of insulin (HCC)          Plan:  Patient was evaluated and treated and all questions answered. Ulcer right posterior ankle with muscle exposed, necorsis. Left fifth metatarsal wound -healed. -No debridement on right. Left foot wound debrided as below.  -Dressed with betadine, DSD. -Off-loading with surgical shoe on left. Discussed patient should be wearing this shoe as it will help prevent wounds and help keep things clean and free from infection.  Presented today with slip on sandals.  -No abx indicated.  -Followed with cardiology but has not been  to see [facility]. He did have appointment scheduled for 7/18 but cancelled.  Advised to call to get in now that ABIs are done.  Discussed that they will need to evaluated him for any possible interventions and limb salvage prospects.  Discussed again the importance of this as wound care is limited until we can make sure he is vascularly optimized. Discussed the longer he waits the more likely he is to loose his leg.  -Discussed glucose control and proper protein-rich diet.  -Discussed if any worsening redness, pain, fever or chills to call or may need to report to the emergency room. Patient expressed understanding.  Discussed with location of wound and possible infection and limited blood flow patient is at risk for limb loss.Expressed understanding.     Procedure: Excisional Debridement of  Wound Rationale: Removal of non-viable soft tissue from the wound to promote healing.  Anesthesia: none Pre-Debridement Wound Measurements: Overlying callus  Post-Debridement Wound Measurements: 1.5 cm x 1 cm x 0.3 cm  Type of Debridement: Sharp Excisional Tissue Removed: Non-viable soft tissue Depth of Debridement: subcutaneous tissue. Technique: Sharp excisional debridement to bleeding, viable wound base.  Dressing: Dry, sterile, compression dressing. Disposition: Patient tolerated procedure well. Patient to return in 3 week for follow-up.  No follow-ups on file.  Follow-up in 3 weeks.    No follow-ups on file.   Louann Sjogren, DPM

## 2022-06-01 NOTE — Progress Notes (Unsigned)
CC:  follow up. Requesting Provider:  Sandrea Hughs, NP  HPI: This is a 71 y.o. male who is here today for follow up for PAD.  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  He had been having pain and blistering for about 7 weeks that had progressed.  It was frankly necrotic with maggot infestation.  He did not have any claudication or rest pain.     On May 19, 2021, he underwent left peroneal angioplasty via right CFA by Dr. Carlis Abbott.  On May 22, 2021, he underwent left 5th toe amputation also by Dr. Carlis Abbott.    Pt was last seen 07/28/2021 and at that time, he was doing well as his toe amputation site had healed and his sutures were removed.  His ABI had improved and bypass without stenosis.  He was to continue to wash his feet daily with dial soap and water daily and moisturize to keep skin from cracking and recommended podiatry for nail trims.  It was recommended for leg elevation and low grade compression knee socks and f/u in 6 months.    Pt was seen in June 2023 by podiatry and now has a right heel wound.  Per their note, he did have an ulcer on the right posterior ankle with muscle exposed.  He did have ABI and the right was 0.75, which was down from 0.96 and his TBI and toe pressure was zero.  His left ABI was 0.91.  His arterial duplex revealed Right: Medial calcification in the tibial arteries.  High grade stenosis vs. occlusion of the PTA; collaterals noted. Left: Medial calcification in the tibial arteries.  High grade stenosis vs. occlusion of the tibial arteries; collaterals noted.  He was seen again by podiatry last week.  He has ulcer on right posterior ankle with muscle exposed and the left 5th metatarsal wound has healed.  She urged him to reschedule his appt with [facility] (he cancelled his appt last week).  The pt returns today for follow up.  He has a wound on the right achilles area that his daughter states has been present for about 3 months.  He does  have a wound on the plantar surface of the left leg but this appears to be healing.  He does have diabetes and continues to smoke.  He refuses to take statin and drinks apple cider vinegar instead.  He does not endorse any rest pain.  The pt is not on a statin for cholesterol management.    The pt is not on an aspirin.    Other AC:  coumadin-afib The pt is on BB, diuretic for hypertension.  The pt does  have diabetes. Tobacco hx:  current    Past Medical History:  Diagnosis Date   A-fib ([place])    a. (06/04/14) TEE-DC-CV; succesful; large LA 6.2 cm   ADHD    Ascites    Athlete's foot    Chronic systolic heart failure ([place])    a. 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, unspecified CKD stage, unspecified whether long term insulin use (HCC)    Heart murmur    HTN (hypertension)    OSA (obstructive sleep apnea)    PVD (peripheral vascular disease) ([place])  s/p great toe amputation    Past Surgical History:  Procedure Laterality Date   ABDOMINAL AORTOGRAM W/LOWER EXTREMITY N/A 05/19/2021   Procedure: ABDOMINAL AORTOGRAM W/LOWER EXTREMITY;  Surgeon: Marty Heck, MD;  Location: [facility];  Service: Cardiovascular;  Laterality: N/A;   AMPUTATION Left 05/22/2021   Procedure: LEFT FIFTH TOE AMPUTATION;  Surgeon: Marty Heck, MD;  Location: [facility];  Service: Vascular;  Laterality: Left;   CARDIOVERSION N/A 06/04/2014   Procedure: CARDIOVERSION;  Surgeon: Larey Dresser, MD;  Location: [facility] ENDOSCOPY;  Service: Cardiovascular;  Laterality: N/A;   CARDIOVERSION N/A 11/06/2021   Procedure: CARDIOVERSION;  Surgeon: Larey Dresser, MD;  Location: [facility] ENDOSCOPY;  Service: Cardiovascular;  Laterality: N/A;   NOSE SURGERY     Nasal septum surgery   PERIPHERAL VASCULAR  INTERVENTION Left 05/19/2021   Procedure: PERIPHERAL VASCULAR INTERVENTION;  Surgeon: Marty Heck, MD;  Location: [facility];  Service: Cardiovascular;  Laterality: Left;   TEE WITHOUT CARDIOVERSION N/A 06/04/2014   Procedure: TRANSESOPHAGEAL ECHOCARDIOGRAM (TEE);  Surgeon: Larey Dresser, MD;  Location: [place];  Service: Cardiovascular;  Laterality: N/A;   TEE WITHOUT CARDIOVERSION N/A 01/06/2016   Procedure: TRANSESOPHAGEAL ECHOCARDIOGRAM (TEE);  Surgeon: Fay Records, MD;  Location: [place];  Service: Cardiovascular;  Laterality: N/A;   TEE WITHOUT CARDIOVERSION N/A 11/06/2021   Procedure: TRANSESOPHAGEAL ECHOCARDIOGRAM (TEE);  Surgeon: Larey Dresser, MD;  Location: [facility] ENDOSCOPY;  Service: Cardiovascular;  Laterality: N/A;   VASECTOMY      No Known Allergies  Current Outpatient Medications  Medication Sig Dispense Refill   blood glucose meter kit and supplies Dispense based on patient and insurance preference. Use up to four times daily as directed. (FOR ICD-10 E10.9, E11.9). (Patient not taking: Reported on 04/20/2022) 1 each 0   diclofenac Sodium (VOLTAREN) 1 % GEL Apply topically 4 (four) times daily as needed.     ENTRESTO 49-51 MG TAKE 1 TABLET BY MOUTH TWICE DAILY 180 tablet 2   furosemide (LASIX) 20 MG tablet Take 1 tablet by mouth 2 (two) times daily. May take an additional tablet daily if needed. 90 tablet 0   Green Tea, Camellia sinensis, (GREEN TEA EXTRACT PO) Take 1 Dose by mouth daily as needed.     insulin glargine (LANTUS) 100 UNIT/ML injection Inject 0.08 mLs (8 Units total) into the skin daily. 10 mL 1   JARDIANCE 10 MG TABS tablet TAKE 1 TABLET(10 MG) BY MOUTH DAILY 30 tablet 3   KRILL OIL OMEGA-3 PO Take 1 tablet by mouth daily. As needed     methocarbamol (ROBAXIN) 500 MG tablet Take 1 tablet (500 mg total) by mouth every 8 (eight) hours as needed for muscle spasms. 50 tablet 1   metoprolol succinate (TOPROL XL) 25 MG 24 hr tablet Take 1  tablet (25 mg total) by mouth daily. 30 tablet 11   spironolactone (ALDACTONE) 25 MG tablet Patient takes 1/2 tablet by mouth every other day.     traMADol (ULTRAM) 50 MG tablet Take 1 tablet (50 mg total) by mouth every 6 (six) hours as needed for moderate pain. 20 tablet 0   warfarin (COUMADIN) 6 MG tablet Take 6 mg by mouth daily. Monday/Friday.     warfarin (COUMADIN) 6 MG tablet Take 3 mg by mouth daily. Tuesday, Wednesday, Thursday, Saturday, and Sunday.     No current facility-administered medications for this visit.    Family History  Problem Relation Age of Onset  Heart attack Mother        deceased   Diabetes Mother    Heart attack Sister     Social History   Socioeconomic History   Marital status: Single    Spouse name: Not on file   Number of children: Not on file   Years of education: Not on file   Highest education level: Not on file  Occupational History   Occupation: retired  Tobacco Use   Smoking status: Some Days    Years: 52.00    Types: Cigarettes, Cigars    Last attempt to quit: 05/2021    Years since quitting: 1.0   Smokeless tobacco: Never  Vaping Use   Vaping Use: Never used  Substance and Sexual Activity   Alcohol use: Not Currently   Drug use: Never   Sexual activity: Not on file  Other Topics Concern   Not on file  Social History Narrative   ** Merged History Encounter ** Lives in [place] by himself. Retired from [employer] and [employer] for [employer].       Tobacco use, amount per day now:   Past tobacco use, amount per day:   How many years did you use tobacco:   Alcohol use (drinks per week): N/A   Diet:   Do you drink/eat things with caffeine:   Marital status:  Divorced                                What year were you married? 2003   Do you live in a house, apartment, assisted living, condo, trailer, etc.? House   Is it one or more stories? One   How many persons live in your home?   Do you have pets in your home?(  please list) N/A   Highest Level of education completed? Bachelors Degree   Current or past profession: Teacher-Special Ed   Do you exercise?  Yes                                Type and how often? Daily squats, and push ups.   Do you have a living will? No   Do you have a DNR form?  No                                 If not, do you want to discuss one?   Do you have signed POA/HPOA forms?  No                      If so, please bring to you appointment      Do you have any difficulty bathing or dressing yourself? Bathing only if pants not shirts/not shorts.   Do you have any difficulty preparing food or eating? No   Do you have any difficulty managing your medications? No   Do you have any difficulty managing your finances? No   Do you have any difficulty affording your medications? No   Social Determinants of Health   Financial Resource Strain: Not on file  Food Insecurity: No Food Insecurity (11/10/2021)   Hunger Vital Sign    Worried About Running Out of Food in the Last Year: Never true    Ran Out of Food in the Last Year: Never true  Transportation Needs: No Transportation Needs (11/10/2021)   PRAPARE - Hydrologist (Medical): No    Lack of Transportation (Non-Medical): No  Physical Activity: Not on file  Stress: Not on file  Social Connections: Not on file  Intimate Partner Violence: Not on file     REVIEW OF SYSTEMS:   '[X]'  denotes positive finding, '[ ]'  denotes negative finding Cardiac  Comments:  Chest pain or chest pressure:    Shortness of breath upon exertion:    Short of breath when lying flat:    Irregular heart rhythm:        Vascular    Pain in calf, thigh, or hip brought on by ambulation:    Pain in feet at night that wakes you up from your sleep:     Blood clot in your veins:    Leg swelling:         Pulmonary    Oxygen at home:    Productive cough:     Wheezing:         Neurologic    Sudden weakness in arms or legs:      Sudden numbness in arms or legs:     Sudden onset of difficulty speaking or slurred speech:    Temporary loss of vision in one eye:     Problems with dizziness:         Gastrointestinal    Blood in stool:     Vomited blood:         Genitourinary    Burning when urinating:     Blood in urine:        Psychiatric    Major depression:         Hematologic    Bleeding problems:    Problems with blood clotting too easily:        Skin    Rashes or ulcers: x       Constitutional    Fever or chills:      PHYSICAL EXAMINATION:  Today's Vitals   06/02/22 0902  BP: 133/85  Pulse: (!) 104  Resp: 20  Temp: 98 F (36.7 C)  TempSrc: Temporal  SpO2: 97%  Weight: 204 lb 3.2 oz (92.6 kg)  Height: '5\' 8"'  (2.595 m)  PainSc: 8    Body mass index is 31.05 kg/m.   General:  WDWN in NAD; vital signs documented above Gait: Not observed HENT: WNL, normocephalic Pulmonary: normal non-labored breathing , without wheezing Cardiac: irregular HR, without carotid bruits Abdomen: soft, NT, no masses; aortic pulse is not palpable Skin: without rashes Vascular Exam/Pulses: +doppler signals right DP/PT/pero and +left DP doppler signal Extremities:   Right achilles area malodorous wound   Left lateral plantar surface   Musculoskeletal: no muscle wasting or atrophy  Neurologic: A&O X 3 Psychiatric:  The pt has Normal affect.   Non-Invasive Vascular Imaging:   ABI's/TBI's on 04/20/2022: Right:  0.75/0 - Great toe pressure: 0 Left:  0.91/0.29 - Great toe pressure: 55  Arterial duplex on 04/20/2022: +-----------+--------+-----+--------+----------+--------+  RIGHT      PSV cm/sRatioStenosisWaveform  Comments  +-----------+--------+-----+--------+----------+--------+  CFA Prox   86                   triphasic           +-----------+--------+-----+--------+----------+--------+  CFA Distal 57                   triphasic            +-----------+--------+-----+--------+----------+--------+  DFA        42                   triphasic           +-----------+--------+-----+--------+----------+--------+  SFA Prox   75                   triphasic           +-----------+--------+-----+--------+----------+--------+  SFA Mid    63                   triphasic           +-----------+--------+-----+--------+----------+--------+  SFA Distal 75                   triphasic           +-----------+--------+-----+--------+----------+--------+  POP Prox   80                   triphasic           +-----------+--------+-----+--------+----------+--------+  POP Distal 114                  triphasic           +-----------+--------+-----+--------+----------+--------+  TP Trunk   95                   triphasic           +-----------+--------+-----+--------+----------+--------+  ATA Prox   109                  monophasic          +-----------+--------+-----+--------+----------+--------+  ATA Mid    65                   monophasic          +-----------+--------+-----+--------+----------+--------+  ATA Distal 47                   monophasic          +-----------+--------+-----+--------+----------+--------+  PTA Prox   96                   monophasic          +-----------+--------+-----+--------+----------+--------+  PTA Mid    33                   monophasic          +-----------+--------+-----+--------+----------+--------+  PTA Distal 27                   monophasic          +-----------+--------+-----+--------+----------+--------+  PERO Prox  47                   monophasic          +-----------+--------+-----+--------+----------+--------+  PERO Mid   75                   monophasic          +-----------+--------+-----+--------+----------+--------+  PERO Distal74                   monophasic           +-----------+--------+-----+--------+----------+--------+   +-----------+--------+-----+--------+----------+--------+  LEFT       PSV cm/sRatioStenosisWaveform  Comments  +-----------+--------+-----+--------+----------+--------+  CFA Prox   90  triphasic           +-----------+--------+-----+--------+----------+--------+  CFA Distal 80                   triphasic           +-----------+--------+-----+--------+----------+--------+  DFA        52                   triphasic           +-----------+--------+-----+--------+----------+--------+  SFA Prox   67                   triphasic           +-----------+--------+-----+--------+----------+--------+  SFA Mid    53                   triphasic           +-----------+--------+-----+--------+----------+--------+  SFA Distal 65                   triphasic           +-----------+--------+-----+--------+----------+--------+  POP Prox   70                   triphasic           +-----------+--------+-----+--------+----------+--------+  POP Distal 132                  triphasic           +-----------+--------+-----+--------+----------+--------+  ATA Prox   63                   monophasic          +-----------+--------+-----+--------+----------+--------+  ATA Mid    131                  monophasic          +-----------+--------+-----+--------+----------+--------+  ATA Distal 82                   monophasic          +-----------+--------+-----+--------+----------+--------+  PTA Prox                occluded                    +-----------+--------+-----+--------+----------+--------+  PTA Mid                 occluded                    +-----------+--------+-----+--------+----------+--------+  PTA Distal 11                   monophasic          +-----------+--------+-----+--------+----------+--------+  PERO Prox  63                    monophasic          +-----------+--------+-----+--------+----------+--------+  PERO Mid                occluded                    +-----------+--------+-----+--------+----------+--------+  PERO Distal110                  monophasic          +-----------+--------+-----+--------+----------+--------+   Summary:  Right: Medial calcification in the tibial arteries. High grade stenosis vs. occlusion of the  PTA; collaterals noted.   Left: Medial calcification in the tibial arteries. High grade stenosis vs. occlusion of the tibial arteries; collaterals noted.   Previous ABI's/TBI's on 07/25/2021: Right:  0.96/0.40 - Great toe pressure: 72 Left:  1.03/0.52 - Great toe pressure:  94  Previous arterial duplex on 07/25/2021: +-----------+--------+-----+--------+----------+--------+  LEFT       PSV cm/sRatioStenosisWaveform  Comments  +-----------+--------+-----+--------+----------+--------+  CFA Distal 125                  biphasic            +-----------+--------+-----+--------+----------+--------+  DFA        52                   biphasic            +-----------+--------+-----+--------+----------+--------+  SFA Prox   72                   triphasic           +-----------+--------+-----+--------+----------+--------+  SFA Mid    85                   triphasic           +-----------+--------+-----+--------+----------+--------+  SFA Distal 62                   triphasic           +-----------+--------+-----+--------+----------+--------+  POP Prox   56                   triphasic           +-----------+--------+-----+--------+----------+--------+  POP Distal 100                  triphasic           +-----------+--------+-----+--------+----------+--------+  ATA Distal 30                   monophasic          +-----------+--------+-----+--------+----------+--------+  PTA Distal 39                    monophasic          +-----------+--------+-----+--------+----------+--------+  PERO Prox  141                  biphasic            +-----------+--------+-----+--------+----------+--------+  PERO Mid   40                   biphasic            +-----------+--------+-----+--------+----------+--------+  PERO Distal55                   monophasic          +-----------+--------+-----+--------+----------+--------+   Summary:  Left: No obvious stenosis noted in the peroneal artery. No hemodynamically significant stenosis noted in the left lower extremity.    ASSESSMENT/PLAN:: 71 y.o. male here for follow up for PAD with hx of angiogram with left peroneal angioplasty on 05/19/2021 by Dr. Carlis Abbott who presents today for non healing wound right leg  -pt with arterial duplex in June that reveals monophasic doppler signals and high grade stenosis vs occlusion of PTA on the right.  Given this wound in non healing and there is muscle and tendon exposure, will set up for angiogram with CO2 given CKD.  Discussed with pt that he may  require minimal contrast for tibial vessels.  Discussed with pt and his daughter that he is at risk of limb loss -left toe amputation site has healed and plantar surface appears to be healing on left -He will need to be off of his coumadin so will plan this for next week. -pt diabetic and continues to smoke.  Discussed with him that this puts him at risk for limb loss. -pt refuses statin and takes apple cider vinegar    Leontine Locket, [facility] MD:   Carlis Abbott

## 2022-06-02 ENCOUNTER — Ambulatory Visit (INDEPENDENT_AMBULATORY_CARE_PROVIDER_SITE_OTHER): Payer: Medicare HMO | Admitting: Physician Assistant

## 2022-06-02 ENCOUNTER — Encounter: Payer: Self-pay | Admitting: Physician Assistant

## 2022-06-02 ENCOUNTER — Telehealth: Payer: Self-pay

## 2022-06-02 ENCOUNTER — Other Ambulatory Visit: Payer: Self-pay

## 2022-06-02 VITALS — BP 133/85 | HR 104 | Temp 98.0°F | Resp 20 | Ht 68.0 in | Wt 204.2 lb

## 2022-06-02 DIAGNOSIS — I70221 Atherosclerosis of native arteries of extremities with rest pain, right leg: Secondary | ICD-10-CM

## 2022-06-02 DIAGNOSIS — F17200 Nicotine dependence, unspecified, uncomplicated: Secondary | ICD-10-CM

## 2022-06-02 NOTE — Telephone Encounter (Signed)
   Patient Name: [name]  DOB: November 10, 1950 MRN: [id number]  Primary Cardiologist: Nicki Guadalajara, MD  Clinical pharmacist have reviewed the patient's past medical history, labs, and current medications as part of pre-operative protocol coverage.   The following recommendations have been made:  Patient with diagnosis of afib on warfarin for anticoagulation. Also with hx of prior LA thrombus in 2017 that has since resolved.   Procedure: abdominal aortogram Date of procedure: 06/11/22   CHA2DS2-VASc Score = 5  This indicates a 7.2% annual risk of stroke. The patient's score is based upon: CHF History: 1 HTN History: 1 Diabetes History: 1 Stroke History: 0 Vascular Disease History: 1 Age Score: 1 Gender Score: 0   CrCl 70mL/min Platelet count 247K   Per office protocol, patient can hold warfarin for 3 days prior to procedure as requested. Patient will not need bridging with Lovenox around procedure.  I will route this recommendation to the requesting party via Epic fax function and remove from pre-op pool.  Please call with questions.  Joylene Grapes, NP 06/02/2022, 11:08 AM

## 2022-06-02 NOTE — Telephone Encounter (Signed)
Patient with diagnosis of afib on warfarin for anticoagulation. Also with hx of prior LA thrombus in 2017 that has since resolved.  Procedure: abdominal aortogram Date of procedure: 06/11/22  CHA2DS2-VASc Score = 5  This indicates a 7.2% annual risk of stroke. The patient's score is based upon: CHF History: 1 HTN History: 1 Diabetes History: 1 Stroke History: 0 Vascular Disease History: 1 Age Score: 1 Gender Score: 0  CrCl 59mL/min Platelet count 247K  Per office protocol, patient can hold warfarin for 3 days prior to procedure as requested. Patient will not need bridging with Lovenox around procedure.  **This guidance is not considered finalized until pre-operative APP has relayed final recommendations.**

## 2022-06-02 NOTE — Telephone Encounter (Addendum)
Darren Fick, RN with [facility] called, pt has been scheduled for abdominal aortogram on 06/11/22 ordered by Dr Sherald Hess.  She has instructed pt to hold Warfarin 3 days prior to procedure, last dosage on Sunday 7/30. She is calling our office for clearance to hold the Warfarin for 3 days prior to procedure, which is their protocol, and is unsure if pt will need Lovenox bridging while holding Warfarin.  Will forward message to pharmacist to clear pt to hold Warfarin for upcoming procedure.  Ria Bush, RN contact number is [phone] option 3 at [facility].

## 2022-06-08 ENCOUNTER — Ambulatory Visit (INDEPENDENT_AMBULATORY_CARE_PROVIDER_SITE_OTHER): Payer: Medicare HMO

## 2022-06-08 DIAGNOSIS — I4891 Unspecified atrial fibrillation: Secondary | ICD-10-CM | POA: Diagnosis not present

## 2022-06-08 DIAGNOSIS — Z5181 Encounter for therapeutic drug level monitoring: Secondary | ICD-10-CM

## 2022-06-08 DIAGNOSIS — I513 Intracardiac thrombosis, not elsewhere classified: Secondary | ICD-10-CM | POA: Diagnosis not present

## 2022-06-08 LAB — POCT INR: INR: 2.5 (ref 2.0–3.0)

## 2022-06-08 NOTE — Patient Instructions (Signed)
Description   Hold Warfarin starting today for procedure on 06/11/22 and then resume post procedure or as instructed by MD taking 1/2 tablet daily except for 1 tablet on Mondays and Fridays. Recheck INR 1 week post procedure.  [facility] [phone].  Pt stopped taking amio on 3/7

## 2022-06-11 ENCOUNTER — Encounter (HOSPITAL_COMMUNITY)
Admission: RE | Disposition: A | Discharge status: Home / Self Care | Payer: Self-pay | Source: Ambulatory Visit | Attending: Vascular Surgery

## 2022-06-11 ENCOUNTER — Other Ambulatory Visit: Payer: Self-pay

## 2022-06-11 ENCOUNTER — Ambulatory Visit (HOSPITAL_COMMUNITY)
Admission: RE | Admit: 2022-06-11 | Discharge: 2022-06-11 | Disposition: A | Discharge status: Home / Self Care | Payer: Medicare HMO | Source: Ambulatory Visit | Attending: Vascular Surgery | Admitting: Vascular Surgery

## 2022-06-11 DIAGNOSIS — Z7984 Long term (current) use of oral hypoglycemic drugs: Secondary | ICD-10-CM | POA: Diagnosis not present

## 2022-06-11 DIAGNOSIS — Z89422 Acquired absence of other left toe(s): Secondary | ICD-10-CM | POA: Diagnosis not present

## 2022-06-11 DIAGNOSIS — Z7901 Long term (current) use of anticoagulants: Secondary | ICD-10-CM | POA: Insufficient documentation

## 2022-06-11 DIAGNOSIS — E1122 Type 2 diabetes mellitus with diabetic chronic kidney disease: Secondary | ICD-10-CM | POA: Insufficient documentation

## 2022-06-11 DIAGNOSIS — F17210 Nicotine dependence, cigarettes, uncomplicated: Secondary | ICD-10-CM | POA: Insufficient documentation

## 2022-06-11 DIAGNOSIS — E11621 Type 2 diabetes mellitus with foot ulcer: Secondary | ICD-10-CM | POA: Diagnosis not present

## 2022-06-11 DIAGNOSIS — N183 Chronic kidney disease, stage 3 unspecified: Secondary | ICD-10-CM | POA: Insufficient documentation

## 2022-06-11 DIAGNOSIS — E1151 Type 2 diabetes mellitus with diabetic peripheral angiopathy without gangrene: Secondary | ICD-10-CM | POA: Diagnosis not present

## 2022-06-11 DIAGNOSIS — I5022 Chronic systolic (congestive) heart failure: Secondary | ICD-10-CM | POA: Insufficient documentation

## 2022-06-11 DIAGNOSIS — I4891 Unspecified atrial fibrillation: Secondary | ICD-10-CM | POA: Diagnosis not present

## 2022-06-11 DIAGNOSIS — Z794 Long term (current) use of insulin: Secondary | ICD-10-CM | POA: Insufficient documentation

## 2022-06-11 DIAGNOSIS — I130 Hypertensive heart and chronic kidney disease with heart failure and stage 1 through stage 4 chronic kidney disease, or unspecified chronic kidney disease: Secondary | ICD-10-CM | POA: Diagnosis not present

## 2022-06-11 DIAGNOSIS — I70221 Atherosclerosis of native arteries of extremities with rest pain, right leg: Secondary | ICD-10-CM

## 2022-06-11 DIAGNOSIS — L97419 Non-pressure chronic ulcer of right heel and midfoot with unspecified severity: Secondary | ICD-10-CM | POA: Diagnosis not present

## 2022-06-11 DIAGNOSIS — I70234 Atherosclerosis of native arteries of right leg with ulceration of heel and midfoot: Secondary | ICD-10-CM | POA: Diagnosis not present

## 2022-06-11 HISTORY — PX: PERIPHERAL VASCULAR BALLOON ANGIOPLASTY: CATH118281

## 2022-06-11 HISTORY — PX: ABDOMINAL AORTOGRAM W/LOWER EXTREMITY: CATH118223

## 2022-06-11 LAB — POCT I-STAT, CHEM 8
BUN: 30 mg/dL — ABNORMAL HIGH (ref 8–23)
BUN: 50 mg/dL — ABNORMAL HIGH (ref 8–23)
Calcium, Ion: 1.11 mmol/L — ABNORMAL LOW (ref 1.15–1.40)
Calcium, Ion: 1.12 mmol/L — ABNORMAL LOW (ref 1.15–1.40)
Chloride: 105 mmol/L (ref 98–111)
Chloride: 105 mmol/L (ref 98–111)
Creatinine, Ser: 1.7 mg/dL — ABNORMAL HIGH (ref 0.61–1.24)
Creatinine, Ser: 1.7 mg/dL — ABNORMAL HIGH (ref 0.61–1.24)
Glucose, Bld: 146 mg/dL — ABNORMAL HIGH (ref 70–99)
Glucose, Bld: 154 mg/dL — ABNORMAL HIGH (ref 70–99)
HCT: 38 % — ABNORMAL LOW (ref 39.0–52.0)
HCT: 40 % (ref 39.0–52.0)
Hemoglobin: 12.9 g/dL — ABNORMAL LOW (ref 13.0–17.0)
Hemoglobin: 13.6 g/dL (ref 13.0–17.0)
Potassium: 4.6 mmol/L (ref 3.5–5.1)
Potassium: 6 mmol/L — ABNORMAL HIGH (ref 3.5–5.1)
Sodium: 138 mmol/L (ref 135–145)
Sodium: 140 mmol/L (ref 135–145)
TCO2: 22 mmol/L (ref 22–32)
TCO2: 27 mmol/L (ref 22–32)

## 2022-06-11 LAB — GLUCOSE, CAPILLARY: Glucose-Capillary: 157 mg/dL — ABNORMAL HIGH (ref 70–99)

## 2022-06-11 LAB — PROTIME-INR
INR: 1.3 — ABNORMAL HIGH (ref 0.8–1.2)
Prothrombin Time: 16 seconds — ABNORMAL HIGH (ref 11.4–15.2)

## 2022-06-11 SURGERY — ABDOMINAL AORTOGRAM W/LOWER EXTREMITY
Anesthesia: LOCAL | Laterality: Right

## 2022-06-11 MED ORDER — SODIUM CHLORIDE 0.9 % IV SOLN: 250.0000 mL | INTRAVENOUS | Status: DC | PRN

## 2022-06-11 MED ORDER — IODIXANOL 320 MG/ML IV SOLN
INTRAVENOUS | Status: DC | PRN
  Administered 2022-06-11: 35 mL

## 2022-06-11 MED ORDER — CLOPIDOGREL BISULFATE 75 MG PO TABS: 75.0000 mg | ORAL_TABLET | Freq: Every day | ORAL | Status: DC

## 2022-06-11 MED ORDER — SODIUM CHLORIDE 0.9 % IV SOLN
INTRAVENOUS | Status: DC
Start: 2022-06-11 — End: 2022-06-11

## 2022-06-11 MED ORDER — CLOPIDOGREL BISULFATE 300 MG PO TABS
ORAL_TABLET | ORAL | Status: DC | PRN
  Administered 2022-06-11: 300 mg via ORAL

## 2022-06-11 MED ORDER — FENTANYL CITRATE (PF) 100 MCG/2ML IJ SOLN
INTRAMUSCULAR | Status: DC | PRN
  Administered 2022-06-11 (×2): 25 ug via INTRAVENOUS

## 2022-06-11 MED ORDER — SODIUM CHLORIDE 0.9 % IV SOLN: INTRAVENOUS | Status: DC

## 2022-06-11 MED ORDER — CLOPIDOGREL BISULFATE 300 MG PO TABS
ORAL_TABLET | ORAL | Status: AC
  Filled 2022-06-11: qty 1

## 2022-06-11 MED ORDER — ONDANSETRON HCL 4 MG/2ML IJ SOLN: 4.0000 mg | Freq: Four times a day (QID) | INTRAMUSCULAR | Status: DC | PRN

## 2022-06-11 MED ORDER — MIDAZOLAM HCL 2 MG/2ML IJ SOLN
INTRAMUSCULAR | Status: DC | PRN
  Administered 2022-06-11 (×2): 1 mg via INTRAVENOUS

## 2022-06-11 MED ORDER — SODIUM CHLORIDE 0.9% FLUSH
3.0000 mL | Freq: Two times a day (BID) | INTRAVENOUS | Status: DC
Start: 2022-06-11 — End: 2022-06-11

## 2022-06-11 MED ORDER — LABETALOL HCL 5 MG/ML IV SOLN: 10.0000 mg | INTRAVENOUS | Status: DC | PRN

## 2022-06-11 MED ORDER — FENTANYL CITRATE (PF) 100 MCG/2ML IJ SOLN
INTRAMUSCULAR | Status: AC
  Filled 2022-06-11: qty 2

## 2022-06-11 MED ORDER — LIDOCAINE HCL (PF) 1 % IJ SOLN
INTRAMUSCULAR | Status: DC | PRN
  Administered 2022-06-11: 2 mL
  Administered 2022-06-11: 12 mL

## 2022-06-11 MED ORDER — SODIUM CHLORIDE 0.9% FLUSH: 3.0000 mL | INTRAVENOUS | Status: DC | PRN

## 2022-06-11 MED ORDER — HEPARIN SODIUM (PORCINE) 1000 UNIT/ML IJ SOLN
INTRAMUSCULAR | Status: AC
  Filled 2022-06-11: qty 10

## 2022-06-11 MED ORDER — HEPARIN (PORCINE) IN NACL 1000-0.9 UT/500ML-% IV SOLN
INTRAVENOUS | Status: DC | PRN
  Administered 2022-06-11 (×2): 500 mL

## 2022-06-11 MED ORDER — HYDRALAZINE HCL 20 MG/ML IJ SOLN: 5.0000 mg | INTRAMUSCULAR | Status: DC | PRN

## 2022-06-11 MED ORDER — MIDAZOLAM HCL 2 MG/2ML IJ SOLN
INTRAMUSCULAR | Status: AC
  Filled 2022-06-11: qty 2

## 2022-06-11 MED ORDER — HEPARIN SODIUM (PORCINE) 1000 UNIT/ML IJ SOLN
INTRAMUSCULAR | Status: DC | PRN
  Administered 2022-06-11: 9000 [IU] via INTRAVENOUS

## 2022-06-11 MED ORDER — CLOPIDOGREL BISULFATE 75 MG PO TABS: 75.0000 mg | ORAL_TABLET | Freq: Every day | ORAL | 11 refills | Status: DC

## 2022-06-11 MED ORDER — ACETAMINOPHEN 325 MG PO TABS: 650.0000 mg | ORAL_TABLET | ORAL | Status: DC | PRN

## 2022-06-11 MED ORDER — CLOPIDOGREL BISULFATE 75 MG PO TABS: 300.0000 mg | ORAL_TABLET | Freq: Once | ORAL | Status: DC

## 2022-06-11 SURGICAL SUPPLY — 21 items
BALLN STERLING OTW 3X150X150 (BALLOONS) ×3
BALLOON STERLING OTW 3X150X150 (BALLOONS) IMPLANT
CATH CXI 2.6F 65 ST (CATHETERS) ×3
CATH OMNI FLUSH 5F 65CM (CATHETERS) ×1 IMPLANT
CATH SPRT STRG 65X2.6FR ACPT (CATHETERS) IMPLANT
CATH TEMPO AQUA 5F 100CM (CATHETERS) ×1 IMPLANT
DEVICE CLOSURE MYNXGRIP 5F (Vascular Products) ×1 IMPLANT
KIT ANGIASSIST CO2 SYSTEM (KITS) ×1 IMPLANT
KIT ENCORE 26 ADVANTAGE (KITS) ×1 IMPLANT
KIT MICROPUNCTURE NIT STIFF (SHEATH) ×1 IMPLANT
KIT PV (KITS) ×3 IMPLANT
PATCH THROMBIX TOPICAL PLAIN (HEMOSTASIS) ×1 IMPLANT
SHEATH GLIDE SLENDER 4/5FR (SHEATH) ×1 IMPLANT
SHEATH MICROPUNCTURE PEDAL 4FR (SHEATH) ×1 IMPLANT
SHEATH PINNACLE 5F 10CM (SHEATH) ×1 IMPLANT
SHEATH PROBE COVER 6X72 (BAG) ×1 IMPLANT
SYR MEDRAD MARK V 150ML (SYRINGE) ×1 IMPLANT
TRANSDUCER W/STOPCOCK (MISCELLANEOUS) ×3 IMPLANT
TRAY PV CATH (CUSTOM PROCEDURE TRAY) ×3 IMPLANT
WIRE BENTSON .035X145CM (WIRE) ×1 IMPLANT
WIRE G V18X300CM (WIRE) ×1 IMPLANT

## 2022-06-11 NOTE — H&P (Signed)
History and Physical Interval Note:  06/11/2022 11:45 AM  [name]  has presented today for surgery, with the diagnosis of ischemia right lower.  The various methods of treatment have been discussed with the patient and family. After consideration of risks, benefits and other options for treatment, the patient has consented to  Procedure(s): ABDOMINAL AORTOGRAM W/LOWER EXTREMITY (N/A) as a surgical intervention.  The patient's history has been reviewed, patient examined, no change in status, stable for surgery.  I have reviewed the patient's chart and labs.  Questions were answered to the patient's satisfaction.     Marty Heck  CC:  follow up. Requesting Provider:  Sandrea Hughs, NP   HPI: This is a 71 y.o. male who is here today for follow up for PAD.  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  He had been having pain and blistering for about 7 weeks that had progressed.  It was frankly necrotic with maggot infestation.  He did not have any claudication or rest pain.     On May 19, 2021, he underwent left peroneal angioplasty via right CFA by Dr. Carlis Abbott.  On May 22, 2021, he underwent left 5th toe amputation also by Dr. Carlis Abbott.     Pt was last seen 07/28/2021 and at that time, he was doing well as his toe amputation site had healed and his sutures were removed.  His ABI had improved and bypass without stenosis.  He was to continue to wash his feet daily with dial soap and water daily and moisturize to keep skin from cracking and recommended podiatry for nail trims.  It was recommended for leg elevation and low grade compression knee socks and f/u in 6 months.     Pt was seen in June 2023 by podiatry and now has a right heel wound.  Per their note, he did have an ulcer on the right posterior ankle with muscle exposed.  He did have ABI and the right was 0.75, which was down from 0.96 and his TBI and toe pressure was zero.  His left ABI was  0.91.  His arterial duplex revealed Right: Medial calcification in the tibial arteries.  High grade stenosis vs. occlusion of the PTA; collaterals noted. Left: Medial calcification in the tibial arteries.  High grade stenosis vs. occlusion of the tibial arteries; collaterals noted.   He was seen again by podiatry last week.  He has ulcer on right posterior ankle with muscle exposed and the left 5th metatarsal wound has healed.  She urged him to reschedule his appt with [facility] (he cancelled his appt last week).   The pt returns today for follow up.  He has a wound on the right achilles area that his daughter states has been present for about 3 months.  He does have a wound on the plantar surface of the left leg but this appears to be healing.  He does have diabetes and continues to smoke.  He refuses to take statin and drinks apple cider vinegar instead.  He does not endorse any rest pain.   The pt is not on a statin for cholesterol management.    The pt is not on an aspirin.    Other AC:  coumadin-afib The pt is on BB, diuretic for hypertension.  The pt does  have diabetes. Tobacco hx:  current           Past Medical History:  Diagnosis Date   A-fib [facility])      a. (06/04/14) TEE-DC-CV; succesful; large LA 6.2 cm   ADHD     Ascites     Athlete's foot     Chronic systolic heart failure ([place])      a. 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, unspecified CKD stage, unspecified whether long term insulin use (HCC)     Heart murmur     HTN (hypertension)     OSA (obstructive sleep apnea)     PVD (peripheral vascular disease) (HCC)      s/p great toe amputation           Past Surgical History:  Procedure Laterality Date   ABDOMINAL AORTOGRAM W/LOWER EXTREMITY N/A 05/19/2021    Procedure:  ABDOMINAL AORTOGRAM W/LOWER EXTREMITY;  Surgeon: Marty Heck, MD;  Location: [place] [facility];  Service: Cardiovascular;  Laterality: N/A;   AMPUTATION Left 05/22/2021    Procedure: LEFT FIFTH TOE AMPUTATION;  Surgeon: Marty Heck, MD;  Location: [place];  Service: Vascular;  Laterality: Left;   CARDIOVERSION N/A 06/04/2014    Procedure: CARDIOVERSION;  Surgeon: Larey Dresser, MD;  Location: [facility] ENDOSCOPY;  Service: Cardiovascular;  Laterality: N/A;   CARDIOVERSION N/A 11/06/2021    Procedure: CARDIOVERSION;  Surgeon: Larey Dresser, MD;  Location: [facility] ENDOSCOPY;  Service: Cardiovascular;  Laterality: N/A;   NOSE SURGERY        Nasal septum surgery   PERIPHERAL VASCULAR INTERVENTION Left 05/19/2021    Procedure: PERIPHERAL VASCULAR INTERVENTION;  Surgeon: Marty Heck, MD;  Location: [place] [facility];  Service: Cardiovascular;  Laterality: Left;   TEE WITHOUT CARDIOVERSION N/A 06/04/2014    Procedure: TRANSESOPHAGEAL ECHOCARDIOGRAM (TEE);  Surgeon: Larey Dresser, MD;  Location: [place];  Service: Cardiovascular;  Laterality: N/A;   TEE WITHOUT CARDIOVERSION N/A 01/06/2016    Procedure: TRANSESOPHAGEAL ECHOCARDIOGRAM (TEE);  Surgeon: Fay Records, MD;  Location: [place];  Service: Cardiovascular;  Laterality: N/A;   TEE WITHOUT CARDIOVERSION N/A 11/06/2021    Procedure: TRANSESOPHAGEAL ECHOCARDIOGRAM (TEE);  Surgeon: Larey Dresser, MD;  Location: [facility] ENDOSCOPY;  Service: Cardiovascular;  Laterality: N/A;   VASECTOMY          No Known Allergies         Current Outpatient Medications  Medication Sig Dispense Refill   blood glucose meter kit and supplies Dispense based on patient and insurance preference. Use up to four times daily as directed. (FOR ICD-10 E10.9, E11.9). (Patient not taking: Reported on 04/20/2022) 1 each 0   diclofenac Sodium (VOLTAREN) 1 % GEL Apply topically 4 (four) times daily as needed.       ENTRESTO 49-51 MG TAKE 1 TABLET BY MOUTH  TWICE DAILY 180 tablet 2   furosemide (LASIX) 20 MG tablet Take 1 tablet by mouth 2 (two) times daily. May take an additional tablet daily if needed. 90 tablet 0   Green Tea, Camellia sinensis, (GREEN TEA EXTRACT PO) Take 1 Dose by mouth daily as needed.       insulin glargine (LANTUS) 100 UNIT/ML injection Inject 0.08 mLs (8 Units total) into the skin daily. 10 mL 1   JARDIANCE 10 MG TABS tablet TAKE 1 TABLET(10 MG) BY MOUTH DAILY 30 tablet  3   KRILL OIL OMEGA-3 PO Take 1 tablet by mouth daily. As needed       methocarbamol (ROBAXIN) 500 MG tablet Take 1 tablet (500 mg total) by mouth every 8 (eight) hours as needed for muscle spasms. 50 tablet 1   metoprolol succinate (TOPROL XL) 25 MG 24 hr tablet Take 1 tablet (25 mg total) by mouth daily. 30 tablet 11   spironolactone (ALDACTONE) 25 MG tablet Patient takes 1/2 tablet by mouth every other day.       traMADol (ULTRAM) 50 MG tablet Take 1 tablet (50 mg total) by mouth every 6 (six) hours as needed for moderate pain. 20 tablet 0   warfarin (COUMADIN) 6 MG tablet Take 6 mg by mouth daily. Monday/Friday.       warfarin (COUMADIN) 6 MG tablet Take 3 mg by mouth daily. Tuesday, Wednesday, Thursday, Saturday, and Sunday.        No current facility-administered medications for this visit.           Family History  Problem Relation Age of Onset   Heart attack Mother          deceased   Diabetes Mother     Heart attack Sister        Social History         Socioeconomic History   Marital status: Single      Spouse name: Not on file   Number of children: Not on file   Years of education: Not on file   Highest education level: Not on file  Occupational History   Occupation: retired  Tobacco Use   Smoking status: Some Days      Years: 52.00      Types: Cigarettes, Cigars      Last attempt to quit: 05/2021      Years since quitting: 1.0   Smokeless tobacco: Never  Vaping Use   Vaping Use: Never used  Substance and Sexual Activity    Alcohol use: Not Currently   Drug use: Never   Sexual activity: Not on file  Other Topics Concern   Not on file  Social History Narrative    ** Merged History Encounter ** Lives in [place] by himself. Retired from [employer] and [employer] for [employer].          Tobacco use, amount per day now:    Past tobacco use, amount per day:    How many years did you use tobacco:    Alcohol use (drinks per week): N/A    Diet:    Do you drink/eat things with caffeine:    Marital status:  Divorced                                What year were you married? 2003    Do you live in a house, apartment, assisted living, condo, trailer, etc.? House    Is it one or more stories? One    How many persons live in your home?    Do you have pets in your home?( please list) N/A    Highest Level of education completed? Bachelors Degree    Current or past profession: Teacher-Special Ed    Do you exercise?  Yes                                Type  and how often? Daily squats, and push ups.    Do you have a living will? No    Do you have a DNR form?  No                                 If not, do you want to discuss one?    Do you have signed POA/HPOA forms?  No                      If so, please bring to you appointment         Do you have any difficulty bathing or dressing yourself? Bathing only if pants not shirts/not shorts.    Do you have any difficulty preparing food or eating? No    Do you have any difficulty managing your medications? No    Do you have any difficulty managing your finances? No    Do you have any difficulty affording your medications? No    Social Determinants of Health        Financial Resource Strain: Not on file  Food Insecurity: No Food Insecurity (11/10/2021)    Hunger Vital Sign     Worried About Running Out of Food in the Last Year: Never true     Ran Out of Food in the Last Year: Never true  Transportation Needs: No Transportation Needs (11/10/2021)    PRAPARE -  Armed forces logistics/support/administrative officer (Medical): No     Lack of Transportation (Non-Medical): No  Physical Activity: Not on file  Stress: Not on file  Social Connections: Not on file  Intimate Partner Violence: Not on file        REVIEW OF SYSTEMS:    _0  denotes positive finding, _1  denotes negative finding Cardiac   Comments:  Chest pain or chest pressure:      Shortness of breath upon exertion:      Short of breath when lying flat:      Irregular heart rhythm:             Vascular      Pain in calf, thigh, or hip brought on by ambulation:      Pain in feet at night that wakes you up from your sleep:       Blood clot in your veins:      Leg swelling:              Pulmonary      Oxygen at home:      Productive cough:       Wheezing:              Neurologic      Sudden weakness in arms or legs:       Sudden numbness in arms or legs:       Sudden onset of difficulty speaking or slurred speech:      Temporary loss of vision in one eye:       Problems with dizziness:              Gastrointestinal      Blood in stool:       Vomited blood:              Genitourinary      Burning when urinating:       Blood in urine:  Psychiatric      Major depression:              Hematologic      Bleeding problems:      Problems with blood clotting too easily:             Skin      Rashes or ulcers: x           Constitutional      Fever or chills:          PHYSICAL EXAMINATION:      Today's Vitals    06/02/22 0902  BP: 133/85  Pulse: (!) 104  Resp: 20  Temp: 98 F (36.7 C)  TempSrc: Temporal  SpO2: 97%  Weight: 204 lb 3.2 oz (92.6 kg)  Height: _0  (1.727 m)  PainSc: 8     Body mass index is 31.05 kg/m.     General:  WDWN in NAD; vital signs documented above Gait: Not observed HENT: WNL, normocephalic Pulmonary: normal non-labored breathing , without wheezing Cardiac: irregular HR, without carotid bruits Abdomen: soft, NT, no masses;  aortic pulse is not palpable Skin: without rashes Vascular Exam/Pulses: +doppler signals right DP/PT/pero and +left DP doppler signal Extremities:    Right achilles area malodorous wound    Left lateral plantar surface    Musculoskeletal: no muscle wasting or atrophy       Neurologic: A&O X 3 Psychiatric:  The pt has Normal affect.     Non-Invasive Vascular Imaging:   ABI's/TBI's on 04/20/2022: Right:  0.75/0 - Great toe pressure: 0 Left:  0.91/0.29 - Great toe pressure: 55   Arterial duplex on 04/20/2022: +-----------+--------+-----+--------+----------+--------+  RIGHT      PSV cm/sRatioStenosisWaveform  Comments  +-----------+--------+-----+--------+----------+--------+  CFA Prox   86                   triphasic           +-----------+--------+-----+--------+----------+--------+  CFA Distal 57                   triphasic           +-----------+--------+-----+--------+----------+--------+  DFA        42                   triphasic           +-----------+--------+-----+--------+----------+--------+  SFA Prox   75                   triphasic           +-----------+--------+-----+--------+----------+--------+  SFA Mid    63                   triphasic           +-----------+--------+-----+--------+----------+--------+  SFA Distal 75                   triphasic           +-----------+--------+-----+--------+----------+--------+  POP Prox   80                   triphasic           +-----------+--------+-----+--------+----------+--------+  POP Distal 114                  triphasic           +-----------+--------+-----+--------+----------+--------+  TP Trunk   95  triphasic           +-----------+--------+-----+--------+----------+--------+  ATA Prox   109                  monophasic          +-----------+--------+-----+--------+----------+--------+  ATA Mid    65                    monophasic          +-----------+--------+-----+--------+----------+--------+  ATA Distal 47                   monophasic          +-----------+--------+-----+--------+----------+--------+  PTA Prox   96                   monophasic          +-----------+--------+-----+--------+----------+--------+  PTA Mid    33                   monophasic          +-----------+--------+-----+--------+----------+--------+  PTA Distal 27                   monophasic          +-----------+--------+-----+--------+----------+--------+  PERO Prox  47                   monophasic          +-----------+--------+-----+--------+----------+--------+  PERO Mid   75                   monophasic          +-----------+--------+-----+--------+----------+--------+  PERO Distal74                   monophasic          +-----------+--------+-----+--------+----------+--------+   +-----------+--------+-----+--------+----------+--------+  LEFT       PSV cm/sRatioStenosisWaveform  Comments  +-----------+--------+-----+--------+----------+--------+  CFA Prox   90                   triphasic           +-----------+--------+-----+--------+----------+--------+  CFA Distal 80                   triphasic           +-----------+--------+-----+--------+----------+--------+  DFA        52                   triphasic           +-----------+--------+-----+--------+----------+--------+  SFA Prox   67                   triphasic           +-----------+--------+-----+--------+----------+--------+  SFA Mid    53                   triphasic           +-----------+--------+-----+--------+----------+--------+  SFA Distal 65                   triphasic           +-----------+--------+-----+--------+----------+--------+  POP Prox   70                   triphasic           +-----------+--------+-----+--------+----------+--------+   POP Distal 132  triphasic           +-----------+--------+-----+--------+----------+--------+  ATA Prox   63                   monophasic          +-----------+--------+-----+--------+----------+--------+  ATA Mid    131                  monophasic          +-----------+--------+-----+--------+----------+--------+  ATA Distal 82                   monophasic          +-----------+--------+-----+--------+----------+--------+  PTA Prox                occluded                    +-----------+--------+-----+--------+----------+--------+  PTA Mid                 occluded                    +-----------+--------+-----+--------+----------+--------+  PTA Distal 11                   monophasic          +-----------+--------+-----+--------+----------+--------+  PERO Prox  63                   monophasic          +-----------+--------+-----+--------+----------+--------+  PERO Mid                occluded                    +-----------+--------+-----+--------+----------+--------+  PERO Distal110                  monophasic          +-----------+--------+-----+--------+----------+--------+   Summary:  Right: Medial calcification in the tibial arteries. High grade stenosis vs. occlusion of the PTA; collaterals noted.   Left: Medial calcification in the tibial arteries. High grade stenosis vs. occlusion of the tibial arteries; collaterals noted.    Previous ABI's/TBI's on 07/25/2021: Right:  0.96/0.40 - Great toe pressure: 72 Left:  1.03/0.52 - Great toe pressure:  94   Previous arterial duplex on 07/25/2021: +-----------+--------+-----+--------+----------+--------+  LEFT       PSV cm/sRatioStenosisWaveform  Comments  +-----------+--------+-----+--------+----------+--------+  CFA Distal 125                  biphasic            +-----------+--------+-----+--------+----------+--------+  DFA         52                   biphasic            +-----------+--------+-----+--------+----------+--------+  SFA Prox   72                   triphasic           +-----------+--------+-----+--------+----------+--------+  SFA Mid    85                   triphasic           +-----------+--------+-----+--------+----------+--------+  SFA Distal 62                   triphasic           +-----------+--------+-----+--------+----------+--------+  POP Prox   56                   triphasic           +-----------+--------+-----+--------+----------+--------+  POP Distal 100                  triphasic           +-----------+--------+-----+--------+----------+--------+  ATA Distal 30                   monophasic          +-----------+--------+-----+--------+----------+--------+  PTA Distal 39                   monophasic          +-----------+--------+-----+--------+----------+--------+  PERO Prox  141                  biphasic            +-----------+--------+-----+--------+----------+--------+  PERO Mid   40                   biphasic            +-----------+--------+-----+--------+----------+--------+  PERO Distal55                   monophasic          +-----------+--------+-----+--------+----------+--------+   Summary:  Left: No obvious stenosis noted in the peroneal artery. No hemodynamically significant stenosis noted in the left lower extremity.      ASSESSMENT/PLAN:: 71 y.o. male here for follow up for PAD with hx of angiogram with left peroneal angioplasty on 05/19/2021 by Dr. Carlis Abbott who presents today for non healing wound right leg   -pt with arterial duplex in June that reveals monophasic doppler signals and high grade stenosis vs occlusion of PTA on the right.  Given this wound in non healing and there is muscle and tendon exposure, will set up for angiogram with CO2 given CKD.  Discussed with pt that he may require minimal  contrast for tibial vessels.  Discussed with pt and his daughter that he is at risk of limb loss -left toe amputation site has healed and plantar surface appears to be healing on left -He will need to be off of his coumadin so will plan this for next week. -pt diabetic and continues to smoke.  Discussed with him that this puts him at risk for limb loss. -pt refuses statin and takes apple cider vinegar       Leontine Locket, [facility] MD:   Carlis Abbott

## 2022-06-11 NOTE — Op Note (Signed)
Patient name: [name] MRN: [id number] DOB: August 30, 1951 Sex: male  06/11/2022 Pre-operative Diagnosis: Critical limb ischemia of the right lower extremity with tissue loss (heel wound) Post-operative diagnosis:  Same Surgeon:  Cephus Shelling, MD Procedure Performed: 1.  Ultrasound-guided access left common femoral artery 2.  CO2 aortogram with catheter selection of aorta 3.  Right lower extremity arteriogram with selection of third order branches 4.  Retrograde access of the right posterior tibial artery at the ankle using ultrasound guidance 5.  Right posterior tibial artery angioplasty via retrograde tibial access (3 mm x 150 mm Sterling) 6.  78 minutes of monitored moderate conscious sedation time  Indications: 71 year old male well-known to vascular surgery previously undergone a left lower extremity intervention with left fifth toe amputation.  He was referred back for now a right lower extremity wound on the Achilles.  He presents today for lower extremity arteriogram and possible intervention with a focus on the right leg after risks benefits discussed.  Findings:   Aortogram was performed with CO2 that showed no flow-limiting stenosis in the aortoiliac segment.  Right lower extremity runoff was done with CO2 and showed a patent common femoral, prounda, SFA, and popliteal without flow limiting stenosis.  He has severe tibial disease.  He has single-vessel runoff via the peroneal artery that is very diseased in the distal calf with multiple collaterals and very small.  This does reconstitute a posterior tibial at the ankle but otherwise there is no proximal stump of posterior tibial with long segment occlusion.  The anterior tibial occludes shortly after takeoff  Ultimately after evaluating images, elected to access the right posterior tibial retrograde at the ankle under ultrasound guidance.  Pedal sheath was placed and we performed angioplasty of the entire posterior  tibial artery with a 3 mm Sterling.  Excellent results with widely patent PT artery now and no significant residual stenosis.   Procedure:  The patient was identified in the holding area and taken to room 8.  The patient was then placed supine on the table and prepped and draped in the usual sterile fashion.  A time out was called.  Patient was given versed and fentanyl for moderate sedation.  Vital signs were monitored including HR, RR, Oxygenation, and BP.  Ultrasound was used to evaluate the left common femoral artery.  It was patent .  A digital ultrasound image was acquired.  A micropuncture needle was used to access the left common femoral artery under ultrasound guidance.  An 018 wire was advanced without resistance and a micropuncture sheath was placed.  The 018 wire was removed and a benson wire was placed.  The micropuncture sheath was exchanged for a 5 french sheath.  An omniflush catheter was advanced over the wire to the level of L-1.  An abdominal angiogram was obtained.  Next, using the omniflush catheter and a benson wire, the aortic bifurcation was crossed and the catheter was placed into theright external iliac artery and right runoff was obtained.  Ultimately we did most of the case with CO2 and had to use limited contrast to evaluate the tibial runoff.  After looking at the pictures he only had peroneal runoff this was very small and diseased in the distal calf and I was concerned that it was too small for balloon angioplasty.  We elected to prep the right foot and stick the posterior tibial retrograde as this did reconstitute at the ankle off peroneal collaterals.  This was accessed under ultrasound  guidance after it was evaluated with ultrasound to prove it was patent.  Was accessed with a micro access needle and placed a microwire and then a Cook pedal sheath.  I used a V18 with a CXI catheter to cross the occluded posterior tibial artery retrograde and I got into the true lumen in the TP  trunk.  Confirmed hand-injection I was in the true lumen.  Upsized for slender sheath in the  posterior tibial artery and patient was given 100 units/kg IV heparin.  The entire posterior tibial was angioplastied with a 3 mm Sterling to nominal pressure for 2 minutes.  Excellent results.  The left femoral sheath was removed and a Mynx closure device was deployed.  The pedal sheath was pulled and manual pressure was held.  Brisk PT signal at the right ankle at completion.  Plan: Patient is optimized after revascularization of the right lower extremity.  His wound is being followed by podiatry.  We will start him on Plavix today and can resume his coumadin tonight.   Cephus Shelling, MD [facility] Office: [phone]

## 2022-06-11 NOTE — Discharge Instructions (Addendum)
Femoral Site Care This sheet gives you information about how to care for yourself after your procedure. Your health care provider may also give you more specific instructions. If you have problems or questions, contact your health care provider. What can I expect after the procedure? After the procedure, it is common to have: Bruising that usually fades within 1-2 weeks. Tenderness at the site. Follow these instructions at home: Wound care May remove bandage after 24 hours. Do not take baths, swim, or use a hot tub for 5 days. You may shower 24-48 hours after the procedure. Gently wash the site with plain soap and water. Pat the area dry with a clean towel. Do not rub the site. This may cause bleeding. Do not apply powder or lotion to the site. Keep the site clean and dry. Check your femoral site every day for signs of infection. Check for: Redness, swelling, or pain. Fluid or blood. Warmth. Pus or a bad smell. Activity For the first 2-3 days after your procedure, or as long as directed: Avoid climbing stairs as much as possible. Do not squat. Do not lift, push or pull anything that is heavier than 10 lb for 5 days. Rest as directed. Avoid sitting for a long time without moving. Get up to take short walks every 1-2 hours. Do not drive for 24 hours. General instructions Take over-the-counter and prescription medicines only as told by your health care provider. Keep all follow-up visits as told by your health care provider. This is important. DRINK PLENTY OF FLUIDS FOR THE NEXT 2-3 DAYS. Contact a health care provider if you have: A fever or chills. You have redness, swelling, or pain around your insertion site. Get help right away if: The catheter insertion area swells very fast. You pass out. You suddenly start to sweat or your skin gets clammy. The catheter insertion area is bleeding, and the bleeding does not stop when you hold steady pressure on the area. The area near or  just beyond the catheter insertion site becomes pale, cool, tingly, or numb. These symptoms may represent a serious problem that is an emergency. Do not wait to see if the symptoms will go away. Get medical help right away. Call your local emergency services (911 in the [place] Do not drive yourself to the hospital. Summary After the procedure, it is common to have bruising that usually fades within 1-2 weeks. Check your femoral site every day for signs of infection. Do not lift, push or pull anything that is heavier than 10 lb for 5 days.  This information is not intended to replace advice given to you by your health care provider. Make sure you discuss any questions you have with your health care provider. Document Revised: 11/08/2017 Document Reviewed: 11/08/2017 Elsevier Patient Education  2020 [employer]  

## 2022-06-11 NOTE — Progress Notes (Signed)
Pt returned from procedure without dsg on (R) foot.  Redressed at pt request.  Telfa and wrap used to cover wound.  Pt's arterial sites unremarkable.

## 2022-06-12 ENCOUNTER — Encounter (HOSPITAL_COMMUNITY): Payer: Self-pay | Admitting: Vascular Surgery

## 2022-06-16 NOTE — Patient Instructions (Signed)
Please contact your local pharmacy, previous provider, or insurance carrier for vaccine/immunization records. Ensure that any procedures done outside of [facility] are faxed to us [phone] or you can sign release of records form at the front desk to keep your medical record updated.   ?

## 2022-06-17 ENCOUNTER — Encounter: Payer: Self-pay | Admitting: Podiatry

## 2022-06-17 ENCOUNTER — Ambulatory Visit (INDEPENDENT_AMBULATORY_CARE_PROVIDER_SITE_OTHER): Payer: Medicare HMO | Admitting: Podiatry

## 2022-06-17 DIAGNOSIS — N1832 Chronic kidney disease, stage 3b: Secondary | ICD-10-CM

## 2022-06-17 DIAGNOSIS — L97413 Non-pressure chronic ulcer of right heel and midfoot with necrosis of muscle: Secondary | ICD-10-CM | POA: Diagnosis not present

## 2022-06-17 DIAGNOSIS — I739 Peripheral vascular disease, unspecified: Secondary | ICD-10-CM

## 2022-06-17 DIAGNOSIS — L97422 Non-pressure chronic ulcer of left heel and midfoot with fat layer exposed: Secondary | ICD-10-CM

## 2022-06-17 DIAGNOSIS — Z794 Long term (current) use of insulin: Secondary | ICD-10-CM

## 2022-06-17 DIAGNOSIS — E1122 Type 2 diabetes mellitus with diabetic chronic kidney disease: Secondary | ICD-10-CM

## 2022-06-17 NOTE — Progress Notes (Signed)
Subjective:  Patient ID: [name], male    DOB: 19-Jun-1951,   MRN: [id number]  Chief Complaint  Patient presents with   Wound Check    Right foot wound check on heel. Drainage , swelling and redness     71 y.o. male presents for follow-up of right heel wound. Relates they have been dressing. Has had vascular intervention on 8/3 and appears to have opened some flow on the right leg.  .Relates he has been staying off his feet at home.Has been continuing dressing the foot.  Denies any new issues or concerns.    Denies any other pedal complaints. Denies n/v/f/c.   Past Medical History:  Diagnosis Date   A-fib [facility])    a. (06/04/14) TEE-DC-CV; succesful; large LA 6.2 cm   ADHD    Ascites    Athlete's foot    Chronic systolic heart failure (HCC)    a. 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 CFT <3 seconds. Normal hair growth on digits. No edema.  Skin. No lacerations or abrasions bilateral feet. Right heel ulcer with granular firbrotic base. Measures about about 4 cm x 3  cm x 0.5 cm with tendon exposed.Necrosis over top with no erythema or edema noted No purulence. Does look improved from prior.   Left fifth metatarsal wound  0.5 cm x 1 cm x 0.2 cm. No erythema edema or purulence noted.  Musculoskeletal: MMT 5/5 bilateral lower extremities in DF, PF, Inversion and Eversion. Deceased ROM in DF of ankle joint.  Neurological: Sensation intact to light touch.   ABIs:   Summary:  Right: Resting  right ankle-brachial index indicates moderate right lower  extremity arterial disease. The right toe-brachial index is abnormal.   Left: Resting left ankle-brachial index indicates mild left lower  extremity arterial disease. The left toe-brachial index is abnormal.   Assessment:   1. Ulcer of right heel and midfoot with necrosis of muscle (HCC)   2. Ulcer of left midfoot with fat layer exposed (HCC)   3. PVD (peripheral vascular disease) (HCC)   4. Type 2 diabetes mellitus with stage 3b chronic kidney disease, with long-term current use of insulin (HCC)           Plan:  Patient was evaluated and treated and all questions answered. Ulcer right posterior ankle with muscle exposed, necorsis. Left fifth metatarsal wound -healed. -No debridement on right. Left foot wound debrided as below.  -Dressed with betadine, DSD. -Off-loading with surgical shoe on left. Discussed patient should be wearing this shoe as it will help prevent wounds and help keep things clean and free from infection.  Presented today in just socks again.  -No abx indicated.  -Dr. Chestine Spore preformed angiogram 8/3 with intervention to open up flow in right foot.  -Discussed glucose control and proper protein-rich diet.  -Discussed if any worsening redness, pain, fever or chills to call or may need to report to the emergency room. Patient expressed understanding.  Discussed with location of wound and possible infection and limited blood flow patient is at risk for limb loss.Expressed understanding.     Procedure: Excisional Debridement of Wound Rationale: Removal of non-viable soft tissue from the wound to promote healing.  Anesthesia: none Pre-Debridement Wound Measurements: Overlying callus on left. Right foot necrotic wound covering  Post-Debridement Wound Measurements: 0.5 cm x 1 cm x 0.2 cm right.  Left wound 4 cm x 3 cm x 0.5 cm  Type of Debridement: Sharp Excisional Tissue Removed: Non-viable soft  tissue Depth of Debridement: subcutaneous tissue. Technique: Sharp excisional debridement to bleeding, viable wound base.  Dressing: Dry, sterile, compression dressing. Disposition: Patient tolerated procedure well. Patient to return in 2 week for follow-up.  Return in about 2 weeks (around 07/01/2022) for wound check.     Return in about 2 weeks (around 07/01/2022) for wound check.   Louann Sjogren, DPM

## 2022-06-18 ENCOUNTER — Ambulatory Visit (INDEPENDENT_AMBULATORY_CARE_PROVIDER_SITE_OTHER): Payer: Medicare HMO

## 2022-06-18 ENCOUNTER — Ambulatory Visit (INDEPENDENT_AMBULATORY_CARE_PROVIDER_SITE_OTHER): Payer: Medicare HMO | Admitting: Family

## 2022-06-18 ENCOUNTER — Encounter: Payer: Self-pay | Admitting: Family

## 2022-06-18 ENCOUNTER — Other Ambulatory Visit: Payer: Medicare HMO

## 2022-06-18 VITALS — BP 100/70 | HR 100 | Temp 97.5°F | Resp 16 | Ht 68.0 in | Wt 204.6 lb

## 2022-06-18 DIAGNOSIS — I480 Paroxysmal atrial fibrillation: Secondary | ICD-10-CM

## 2022-06-18 DIAGNOSIS — E785 Hyperlipidemia, unspecified: Secondary | ICD-10-CM | POA: Diagnosis not present

## 2022-06-18 DIAGNOSIS — I5032 Chronic diastolic (congestive) heart failure: Secondary | ICD-10-CM

## 2022-06-18 DIAGNOSIS — I5042 Chronic combined systolic (congestive) and diastolic (congestive) heart failure: Secondary | ICD-10-CM

## 2022-06-18 DIAGNOSIS — N1832 Chronic kidney disease, stage 3b: Secondary | ICD-10-CM

## 2022-06-18 DIAGNOSIS — Z5181 Encounter for therapeutic drug level monitoring: Secondary | ICD-10-CM | POA: Diagnosis not present

## 2022-06-18 DIAGNOSIS — I513 Intracardiac thrombosis, not elsewhere classified: Secondary | ICD-10-CM

## 2022-06-18 DIAGNOSIS — I739 Peripheral vascular disease, unspecified: Secondary | ICD-10-CM

## 2022-06-18 DIAGNOSIS — E1122 Type 2 diabetes mellitus with diabetic chronic kidney disease: Secondary | ICD-10-CM

## 2022-06-18 DIAGNOSIS — N183 Chronic kidney disease, stage 3 unspecified: Secondary | ICD-10-CM | POA: Diagnosis not present

## 2022-06-18 DIAGNOSIS — I130 Hypertensive heart and chronic kidney disease with heart failure and stage 1 through stage 4 chronic kidney disease, or unspecified chronic kidney disease: Secondary | ICD-10-CM | POA: Diagnosis not present

## 2022-06-18 DIAGNOSIS — I4891 Unspecified atrial fibrillation: Secondary | ICD-10-CM | POA: Diagnosis not present

## 2022-06-18 DIAGNOSIS — IMO0002 Reserved for concepts with insufficient information to code with codable children: Secondary | ICD-10-CM

## 2022-06-18 LAB — POCT INR: INR: 2 (ref 2.0–3.0)

## 2022-06-18 NOTE — Patient Instructions (Signed)
Continue 1/2 tablet daily except for 1 tablet on Mondays and Fridays. Recheck INR 4 weeks post procedure.  [facility] [phone].  Pt stopped taking amio on 3/7

## 2022-06-18 NOTE — Progress Notes (Signed)
Provider: [name] FNP-C   , Nelda Bucks, NP  Patient Care Team: , Nelda Bucks, NP as PCP - General (Family Medicine) Bensimhon, Shaune Pascal, MD as PCP - Advanced Heart Failure (Cardiology) Troy Sine, MD as PCP - Cardiology (Cardiology) Troy Sine, MD as Consulting Physician (Cardiology) Larey Dresser, MD as Consulting Physician (Cardiology)  Extended Emergency Contact Information Primary Emergency Contact: [facility] Phone: [phone] Relation: Daughter Secondary Emergency Contact: [place] Phone: [phone] Mobile Phone: [phone] Relation: Other  Code Status:  Full Code  Goals of care: Advanced Directive information    06/18/2022    9:53 AM  Advanced Directives  Does Patient Have a Medical Advance Directive? No  Would patient like information on creating a medical advance directive? No - Patient declined     Chief Complaint  Patient presents with   Medical Management of Chronic Issues    6 month follow up.    Health Maintenance    Discuss the need for Hemoglobin A1C, and Eye exam.    Immunizations    Discuss the need for Pne vaccine, Influenza vaccine.    HPI:  Pt is a 71 y.o. male seen today for 6 months follow up for medical management of chronic diseases.  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 He is here with her daughter and little granddaughter. He is status post abdominal aortogram with lower extremity on 06/11/2022 and by vascular surgery Dr. Monica Martinez.  Daughter states patient had refused to take Plavix but has  restarted.   Type 2 DM -no home CBG for review.  Refuses to check blood sugars states takes apple cider vinegar which helps with her blood sugar and cholesterol.He denies any signs of hypo or hyperglycemia. Follows up with podiatrist for right foot ulcers. Offloading shoe was recommended but patient is here with hospital socks on without any issues.  Refuses to wear shoes.  Hyperlipidemia-refused statin status apple cider vinegar or low-cholesterol.  Hypertension -blood pressure well-controlled taking his Metoprolol and Aldactone.denies any headache,dizziness,vision changes,fatigue,chest tightness,palpitation,chest pain or shortness of breath.    Arterial fibrillation -he denies any palpitation.  On Coumadin follows up with Coumadin clinic.  Also denies any bleeding.  Congestive heart failure -on furosemide 20 mg tablet.  Denies any shortness of breath, edema or worsening cough.  His weight is stable compared to previous.    Past Medical History:  Diagnosis Date   A-fib [facility])    a. (06/04/14) TEE-DC-CV; succesful; large LA 6.2 cm   ADHD    Ascites    Athlete's foot    Chronic systolic heart failure ([place])    a. 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, unspecified CKD stage, unspecified whether long term insulin use (HCC)    Heart murmur    HTN (hypertension)    OSA (obstructive sleep apnea)    PVD (peripheral vascular disease) (HCC)    s/p great toe amputation   Past Surgical History:  Procedure Laterality Date  ABDOMINAL AORTOGRAM W/LOWER EXTREMITY N/A 05/19/2021   Procedure: ABDOMINAL AORTOGRAM W/LOWER EXTREMITY;  Surgeon: Marty Heck, MD;  Location: [place] [facility];  Service: Cardiovascular;  Laterality: N/A;   ABDOMINAL AORTOGRAM W/LOWER EXTREMITY Right 06/11/2022    Procedure: ABDOMINAL AORTOGRAM W/LOWER EXTREMITY;  Surgeon: Marty Heck, MD;  Location: [place] [facility];  Service: Cardiovascular;  Laterality: Right;   AMPUTATION Left 05/22/2021   Procedure: LEFT FIFTH TOE AMPUTATION;  Surgeon: Marty Heck, MD;  Location: [place];  Service: Vascular;  Laterality: Left;   CARDIOVERSION N/A 06/04/2014   Procedure: CARDIOVERSION;  Surgeon: Larey Dresser, MD;  Location: [facility] ENDOSCOPY;  Service: Cardiovascular;  Laterality: N/A;   CARDIOVERSION N/A 11/06/2021   Procedure: CARDIOVERSION;  Surgeon: Larey Dresser, MD;  Location: [facility] ENDOSCOPY;  Service: Cardiovascular;  Laterality: N/A;   NOSE SURGERY     Nasal septum surgery   PERIPHERAL VASCULAR BALLOON ANGIOPLASTY  06/11/2022   Procedure: PERIPHERAL VASCULAR BALLOON ANGIOPLASTY;  Surgeon: Marty Heck, MD;  Location: [place] [facility];  Service: Cardiovascular;;   PERIPHERAL VASCULAR INTERVENTION Left 05/19/2021   Procedure: PERIPHERAL VASCULAR INTERVENTION;  Surgeon: Marty Heck, MD;  Location: [place] [facility];  Service: Cardiovascular;  Laterality: Left;   TEE WITHOUT CARDIOVERSION N/A 06/04/2014   Procedure: TRANSESOPHAGEAL ECHOCARDIOGRAM (TEE);  Surgeon: Larey Dresser, MD;  Location: [place];  Service: Cardiovascular;  Laterality: N/A;   TEE WITHOUT CARDIOVERSION N/A 01/06/2016   Procedure: TRANSESOPHAGEAL ECHOCARDIOGRAM (TEE);  Surgeon: Fay Records, MD;  Location: [place];  Service: Cardiovascular;  Laterality: N/A;   TEE WITHOUT CARDIOVERSION N/A 11/06/2021   Procedure: TRANSESOPHAGEAL ECHOCARDIOGRAM (TEE);  Surgeon: Larey Dresser, MD;  Location: [facility] ENDOSCOPY;  Service: Cardiovascular;  Laterality: N/A;   VASECTOMY      No Known Allergies  Allergies as of 06/18/2022   No Known Allergies      Medication List        Accurate as of June 18, 2022 10:07 AM. If you have any questions, ask your nurse or doctor.          APPLE CIDER VINEGAR  PO Take 30 mLs by mouth 4 (four) times a week.   BETADINE EX Apply 1 Application topically daily.   blood glucose meter kit and supplies Dispense based on patient and insurance preference. Use up to four times daily as directed. (FOR ICD-10 E10.9, E11.9).   clopidogrel 75 MG tablet Commonly known as: Plavix Take 1 tablet (75 mg total) by mouth daily.   diclofenac Sodium 1 % Gel Commonly known as: VOLTAREN Apply 1 Application topically 4 (four) times daily as needed (pain).   Entresto 49-51 MG Generic drug: sacubitril-valsartan TAKE 1 TABLET BY MOUTH TWICE DAILY   furosemide 20 MG tablet Commonly known as: LASIX Take 1 tablet by mouth 2 (two) times daily. May take an additional tablet daily if needed.   HYALURONIC ACID PO Take 2 capsules by mouth daily.   Jardiance 10 MG Tabs tablet Generic drug: empagliflozin TAKE 1 TABLET(10 MG) BY MOUTH DAILY   JOINT SUPPORT PO Take 1 Dose by mouth 3 (three) times a week.   KRILL OIL OMEGA-3 PO Take 1 tablet by mouth daily.   Lantus 100 UNIT/ML injection Generic drug: insulin glargine Inject 0.08 mLs (8 Units total) into the skin daily.   methocarbamol 500 MG tablet Commonly known as: ROBAXIN Take 1 tablet (500 mg total) by mouth every 8 (eight) hours as needed for muscle spasms.  metoprolol succinate 25 MG 24 hr tablet Commonly known as: Toprol XL Take 1 tablet (25 mg total) by mouth daily.   spironolactone 25 MG tablet Commonly known as: ALDACTONE Take 12.5 mg by mouth daily.   traMADol 50 MG tablet Commonly known as: ULTRAM Take 1 tablet (50 mg total) by mouth every 6 (six) hours as needed for moderate pain.   warfarin 6 MG tablet Commonly known as: COUMADIN Take as directed by the anticoagulation clinic. If you are unsure how to take this medication, talk to your nurse or doctor. Original instructions: Take 3-6 mg by mouth See admin instructions. Take 6 mg on Mon and Fri Take 3 mg on Sun, Tues, Wed, Thurs, and  Sat   WOUND Wirt EX Apply 1 Application topically daily.        Review of Systems  Constitutional:  Negative for appetite change, chills, fatigue, fever and unexpected weight change.  HENT:  Negative for congestion, dental problem, ear discharge, ear pain, facial swelling, hearing loss, nosebleeds, postnasal drip, rhinorrhea, sinus pressure, sinus pain, sneezing, sore throat, tinnitus and trouble swallowing.   Eyes:  Negative for pain, discharge, redness, itching and visual disturbance.  Respiratory:  Negative for cough, chest tightness, shortness of breath and wheezing.   Cardiovascular:  Negative for chest pain, palpitations and leg swelling.  Gastrointestinal:  Negative for abdominal distention, abdominal pain, blood in stool, constipation, diarrhea, nausea and vomiting.  Endocrine: Negative for cold intolerance, heat intolerance, polydipsia, polyphagia and polyuria.  Genitourinary:  Negative for difficulty urinating, dysuria, flank pain, frequency and urgency.  Musculoskeletal:  Negative for arthralgias, back pain, gait problem, joint swelling, myalgias, neck pain and neck stiffness.  Skin:  Negative for color change, pallor, rash and wound.  Neurological:  Negative for dizziness, syncope, speech difficulty, weakness, light-headedness, numbness and headaches.  Hematological:  Does not bruise/bleed easily.  Psychiatric/Behavioral:  Negative for agitation, behavioral problems, confusion, hallucinations, self-injury, sleep disturbance and suicidal ideas. The patient is not nervous/anxious.     Immunization History  Administered Date(s) Administered   Tdap 05/01/2016   Pertinent  Health Maintenance Due  Topic Date Due   FOOT EXAM  Never done   OPHTHALMOLOGY EXAM  Never done   INFLUENZA VACCINE  06/09/2022   HEMOGLOBIN A1C  06/10/2022   COLONOSCOPY (Pts 45-72yr Insurance coverage will need to be confirmed)  Discontinued      12/15/2021    9:46 AM 12/18/2021   11:48 AM 03/03/2022     8:40 AM 06/11/2022    9:33 AM 06/18/2022    9:52 AM  Fall Risk  Falls in the past year?  1 0  0  Was there an injury with Fall?  1 0  0  Fall Risk Category Calculator  3 0  0  Fall Risk Category  High Low  Low  Patient Fall Risk Level High fall risk High fall risk Low fall risk Moderate fall risk Low fall risk  Patient at Risk for Falls Due to  History of fall(s) No Fall Risks  No Fall Risks  Fall risk Follow up  Falls evaluation completed;Education provided;Falls prevention discussed Falls evaluation completed  Falls evaluation completed   Functional Status Survey:    Vitals:   06/18/22 0943  BP: 100/70  Pulse: 100  Resp: 16  Temp: (!) 97.5 F (36.4 C)  SpO2: 94%  Weight: 204 lb 9.6 oz (92.8 kg)  Height: 5' 8" (1.727 m)   Body mass index is 31.11 kg/m. Physical Exam Vitals  reviewed.  Constitutional:      General: He is not in acute distress.    Appearance: Normal appearance. He is obese. He is not ill-appearing or diaphoretic.  HENT:     Head: Normocephalic.     Right Ear: Tympanic membrane, ear canal and external ear normal. There is no impacted cerumen.     Left Ear: Tympanic membrane, ear canal and external ear normal. There is no impacted cerumen.     Nose: Nose normal. No congestion or rhinorrhea.     Mouth/Throat:     Mouth: Mucous membranes are moist.     Pharynx: Oropharynx is clear. No oropharyngeal exudate or posterior oropharyngeal erythema.  Eyes:     General: No scleral icterus.       Right eye: No discharge.        Left eye: No discharge.     Conjunctiva/sclera: Conjunctivae normal.     Pupils: Pupils are equal, round, and reactive to light.  Neck:     Vascular: No carotid bruit.  Cardiovascular:     Rate and Rhythm: Normal rate and regular rhythm.     Heart sounds: Murmur heard.     No friction rub. No gallop.     Comments: Bilateral foot wound dressing in place pulses not assessed.  Patient stated podiatrist change dressing yesterday. Pulmonary:      Effort: Pulmonary effort is normal. No respiratory distress.     Breath sounds: Normal breath sounds. No wheezing, rhonchi or rales.  Chest:     Chest wall: No tenderness.  Abdominal:     General: Bowel sounds are normal. There is no distension.     Palpations: Abdomen is soft. There is no mass.     Tenderness: There is no abdominal tenderness. There is no right CVA tenderness, left CVA tenderness, guarding or rebound.  Musculoskeletal:        General: No swelling or tenderness. Normal range of motion.     Cervical back: Normal range of motion. No rigidity or tenderness.     Right lower leg: No edema.     Left lower leg: No edema.  Lymphadenopathy:     Cervical: No cervical adenopathy.  Skin:    General: Skin is warm and dry.     Coloration: Skin is not pale.     Findings: No bruising, erythema, lesion or rash.     Comments: Bilateral foot wounds not visualized patient stated wounds managed by wound care and podiatrist.  Dressing clean and intact  Neurological:     Mental Status: He is alert and oriented to person, place, and time.     Cranial Nerves: No cranial nerve deficit.     Sensory: No sensory deficit.     Motor: No weakness.     Coordination: Coordination normal.     Gait: Gait abnormal.  Psychiatric:        Mood and Affect: Mood normal.        Speech: Speech normal.        Behavior: Behavior normal.        Thought Content: Thought content normal.        Judgment: Judgment normal.     Labs reviewed: Recent Labs    10/28/21 2112 10/30/21 0248 10/31/21 0250 11/01/21 0440 12/18/21 1414 12/26/21 1645 04/20/22 1625 06/11/22 0940 06/11/22 1000  NA 140   < > 138   < > 137 140 138 138 140  K 4.3   < > 3.7   < >  5.1 4.9 4.3 6.0* 4.6  CL 106   < > 100   < > 103 105 106 105 105  CO2 28   < > 32   < > _0 --   --   GLUCOSE 206*   < > 130*   < > 125* 163* 174* 154* 146*  BUN 28*   < > 22   < > 36* 31* 26* 50* 30*  CREATININE 1.50*   < > 1.60*   < > 1.69* 1.51*  1.62* 1.70* 1.70*  CALCIUM 9.0   < > 8.6*   < > 9.0 9.2 9.0  --   --   MG 2.4  --  2.3  --   --   --   --   --   --    < > = values in this interval not displayed.   Recent Labs    10/28/21 2112 12/11/21 0851  AST 23 30  ALT 35 42  ALKPHOS 41 32*  BILITOT 1.1 1.0  PROT 7.3 8.0  ALBUMIN 3.6 3.9   Recent Labs    10/28/21 2112 10/30/21 0248 12/11/21 0851 12/11/21 0901 12/12/21 0151 12/18/21 1414 06/11/22 0940 06/11/22 1000  WBC 6.2   < > 12.3*  --  8.5 6.9  --   --   NEUTROABS 4.4  --   --   --   --  4,947  --   --   HGB 11.6*   < > 13.8   < > 12.1* 12.8* 13.6 12.9*  HCT 39.3   < > 44.6   < > 37.6* 39.6 40.0 38.0*  MCV 94.9   < > 89.2  --  88.7 86.7  --   --   PLT 166   < > 186  --  157 247  --   --    < > = values in this interval not displayed.   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  Ultrasound-guided access left common femoral artery 2.  CO2 aortogram with catheter selection of aorta 3.  Right lower extremity arteriogram with selection of third order branches 4.  Retrograde access of the right posterior tibial artery at the ankle using ultrasound guidance 5.  Right posterior tibial artery angioplasty via retrograde tibial access (3 mm x 150 mm Sterling) 6.  78 minutes of monitored moderate conscious sedation time   Indications: 71 year old male well-known to vascular surgery previously undergone a left lower extremity intervention with left fifth toe amputation.  He was referred back for now a right lower extremity wound on the Achilles.  He presents today for lower extremity arteriogram and possible intervention with a focus on the right leg after risks benefits  discussed.  Findings:  Aortogram was performed with CO2 that showed no flow-limiting stenosis in the aortoiliac segment.  Right lower extremity runoff was done with CO2 and showed a patent common femoral, prounda, SFA, and popliteal without flow limiting stenosis.  He has severe tibial disease.  He has single-vessel runoff via the peroneal artery that is very diseased in the distal calf with multiple collaterals and very small.  This does reconstitute a posterior tibial at the ankle but otherwise there is no proximal stump of posterior tibial with long segment occlusion.  The anterior tibial occludes shortly after takeoff  Ultimately after evaluating images, elected to access the right posterior tibial retrograde at the ankle under ultrasound guidance.  Pedal sheath was placed and we performed angioplasty of the entire posterior tibial artery with a 3 mm Sterling.  Excellent results with widely patent PT artery now and no significant residual stenosis.             Procedure:  The patient was identified in the holding area and taken to room 8.  The patient was then placed supine on the table and prepped and draped in the usual sterile fashion.  A time out was called.  Patient was given versed and fentanyl for moderate sedation.  Vital signs were monitored including HR, RR, Oxygenation, and BP.  Ultrasound was used to evaluate the left common femoral artery.  It was patent .  A digital ultrasound image was acquired.  A micropuncture needle was used to access the left common femoral artery under ultrasound guidance.  An 018 wire was advanced without resistance and a  micropuncture sheath was placed.  The 018 wire was removed and a benson wire was placed.  The micropuncture sheath was exchanged for a 5 french sheath.  An omniflush catheter was advanced over the wire to the level of L-1.  An abdominal angiogram was obtained.  Next, using the omniflush catheter and a benson wire, the aortic bifurcation was crossed and the catheter was placed into theright external iliac artery and right runoff was obtained.  Ultimately we did most of the case with CO2 and had to use limited contrast to evaluate the tibial runoff.  After looking at the pictures he only had peroneal runoff this was very small and diseased in the distal calf and I was concerned that it was too small for balloon angioplasty.  We elected to prep the right foot and stick the posterior tibial retrograde as this did reconstitute at the ankle off peroneal collaterals.  This was accessed under ultrasound guidance after it was evaluated with ultrasound to prove it was patent.  Was accessed with a micro access needle and placed a microwire and then a Cook pedal sheath.  I used a V18 with a CXI catheter to cross the occluded posterior tibial artery retrograde and I got into the true lumen in the TP trunk.  Confirmed hand-injection I was in the true lumen.  Upsized for slender sheath in the  posterior tibial artery and patient was given 100 units/kg IV heparin.  The entire posterior tibial was angioplastied with a 3 mm Sterling to nominal pressure for 2 minutes.  Excellent results.  The left femoral sheath was removed and a Mynx closure device was deployed.  The pedal sheath was pulled and manual pressure was held.  Brisk PT signal at the right ankle at completion.  Plan: Patient is optimized after revascularization of the right lower extremity.  His wound is being followed by  podiatry.  We will start him on Plavix today and can resume his coumadin tonight.   Marty Heck, MD [facility]  Office: [phone]     Assessment/Plan 1. Type 2 diabetes mellitus with chronic kidney disease, without long-term current use of insulin, unspecified CKD stage (HCC) Lab Results  Component Value Date   HGBA1C 6.8 (H) 12/11/2021  -No home CBG for review does not check blood sugars at home. -Continue on jaundice and Lantus - TSH - COMPLETE METABOLIC PANEL WITH GFR - CBC with Differential/Platelet - Hemoglobin A1c  2. Chronic combined systolic and diastolic congestive heart failure (HCC) No signs of fluid overload -Continue on furosemide, Spironolactone and Entresto - COMPLETE METABOLIC PANEL WITH GFR - CBC with Differential/Platelet  3. Hypertensive heart and kidney disease with chronic diastolic congestive heart failure and stage 3 chronic kidney disease, unspecified whether stage 3a or 3b CKD (HCC) Blood pressure well-controlled -Continue on Metroprolol and spironolactone -On anticoagulant but declines statin  4. PAF (paroxysmal atrial fibrillation) (HCC) Heart rate controlled -Continue on warfarin INR managed by Coumadin clinic -Continue on metoprolol for heart rate control - CBC with Differential/Platelet  5. PVD (peripheral vascular disease) ([place]) Bilateral foot also small to visualize patient declines that dressing change by podiatrist yesterday. -Continue to follow-up with the podiatrist and wound care -Continue on Plavix declines statin takes apple cider vinegar instead which he states helps with cholesterol and overall body health.   6. Hyperlipidemia LDL goal <70 Declines statin -Continue on [place] L omega-3 - Lipid panel  Family/ staff Communication: Reviewed plan of care with patient and daughter for verbalized understanding  Labs/tests ordered:  - CBC with Differential/Platelet - CMP with eGFR([facility]) - TSH - Hgb A1C - Lipid panel  Next Appointment :Return in about 6 months (around 12/19/2022) for medical mangement of chronic issues.Sandrea Hughs, NP

## 2022-06-19 LAB — COMPLETE METABOLIC PANEL WITH GFR
AG Ratio: 1.1 (calc) (ref 1.0–2.5)
ALT: 22 U/L (ref 9–46)
AST: 17 U/L (ref 10–35)
Albumin: 4 g/dL (ref 3.6–5.1)
Alkaline phosphatase (APISO): 40 U/L (ref 35–144)
BUN/Creatinine Ratio: 17 (calc) (ref 6–22)
BUN: 32 mg/dL — ABNORMAL HIGH (ref 7–25)
CO2: 24 mmol/L (ref 20–32)
Calcium: 9 mg/dL (ref 8.6–10.3)
Chloride: 105 mmol/L (ref 98–110)
Creat: 1.91 mg/dL — ABNORMAL HIGH (ref 0.70–1.28)
Globulin: 3.6 g/dL (calc) (ref 1.9–3.7)
Glucose, Bld: 124 mg/dL — ABNORMAL HIGH (ref 65–99)
Potassium: 4.5 mmol/L (ref 3.5–5.3)
Sodium: 140 mmol/L (ref 135–146)
Total Bilirubin: 0.5 mg/dL (ref 0.2–1.2)
Total Protein: 7.6 g/dL (ref 6.1–8.1)
eGFR: 37 mL/min/{1.73_m2} — ABNORMAL LOW (ref >=60)

## 2022-06-19 LAB — CBC WITH DIFFERENTIAL/PLATELET
Absolute Monocytes: 632 cells/uL (ref 200–950)
Basophils Absolute: 47 cells/uL (ref 0–200)
Basophils Relative: 0.5 %
Eosinophils Absolute: 102 cells/uL (ref 15–500)
Eosinophils Relative: 1.1 %
HCT: 39.7 % (ref 38.5–50.0)
Hemoglobin: 13.1 g/dL — ABNORMAL LOW (ref 13.2–17.1)
Lymphs Abs: 1386 cells/uL (ref 850–3900)
MCH: 28.8 pg (ref 27.0–33.0)
MCHC: 33 g/dL (ref 32.0–36.0)
MCV: 87.3 fL (ref 80.0–100.0)
MPV: 10.7 fL (ref 7.5–12.5)
Monocytes Relative: 6.8 %
Neutro Abs: 7133 cells/uL (ref 1500–7800)
Neutrophils Relative %: 76.7 %
Platelets: 222 10*3/uL (ref 140–400)
RBC: 4.55 10*6/uL (ref 4.20–5.80)
RDW: 13.6 % (ref 11.0–15.0)
Total Lymphocyte: 14.9 %
WBC: 9.3 10*3/uL (ref 3.8–10.8)

## 2022-06-19 LAB — LIPID PANEL
Cholesterol: 168 mg/dL (ref <200)
HDL: 37 mg/dL — ABNORMAL LOW (ref >=40)
LDL Cholesterol (Calc): 111 mg/dL (calc) — ABNORMAL HIGH
Non-HDL Cholesterol (Calc): 131 mg/dL (calc) — ABNORMAL HIGH (ref <130)
Total CHOL/HDL Ratio: 4.5 (calc) (ref <5.0)
Triglycerides: 96 mg/dL (ref <150)

## 2022-06-19 LAB — HEMOGLOBIN A1C
Hgb A1c MFr Bld: 6.5 % of total Hgb — ABNORMAL HIGH (ref <5.7)
Mean Plasma Glucose: 140 mg/dL
eAG (mmol/L): 7.7 mmol/L

## 2022-06-19 LAB — TSH: TSH: 1.93 mIU/L (ref 0.40–4.50)

## 2022-07-01 ENCOUNTER — Ambulatory Visit (INDEPENDENT_AMBULATORY_CARE_PROVIDER_SITE_OTHER): Payer: Medicare HMO | Admitting: Podiatry

## 2022-07-01 ENCOUNTER — Encounter: Payer: Self-pay | Admitting: Podiatry

## 2022-07-01 DIAGNOSIS — L97413 Non-pressure chronic ulcer of right heel and midfoot with necrosis of muscle: Secondary | ICD-10-CM

## 2022-07-01 DIAGNOSIS — N1832 Chronic kidney disease, stage 3b: Secondary | ICD-10-CM

## 2022-07-01 DIAGNOSIS — E1122 Type 2 diabetes mellitus with diabetic chronic kidney disease: Secondary | ICD-10-CM

## 2022-07-01 DIAGNOSIS — Z794 Long term (current) use of insulin: Secondary | ICD-10-CM

## 2022-07-01 DIAGNOSIS — L97422 Non-pressure chronic ulcer of left heel and midfoot with fat layer exposed: Secondary | ICD-10-CM

## 2022-07-01 DIAGNOSIS — I739 Peripheral vascular disease, unspecified: Secondary | ICD-10-CM

## 2022-07-01 NOTE — Progress Notes (Signed)
Subjective:  Patient ID: [name], male    DOB: July 03, 1951,   MRN: [id number]  Chief Complaint  Patient presents with   Foot Ulcer     Patient states surgical shoe is not helpful, hard to walk in surgical shoe .Marland Kitchen He thinks he is going to fall right foot wound more discharge.    71 y.o. male presents for follow-up of right heel wound. Relates they have been dressing. Has had vascular intervention on 8/3 and appears to have opened some flow on the right leg.  .Relates he has been staying off his feet at home.Has been continuing dressing the foot.  Denies any new issues or concerns.  Has not been wearing anything to protect his feet. Has had referral placed to wound care but not answered. They plan to go over today to make an appointment in person.   Denies any other pedal complaints. Denies n/v/f/c.   Past Medical History:  Diagnosis Date   A-fib [facility])    a. (06/04/14) TEE-DC-CV; succesful; large LA 6.2 cm   ADHD    Ascites    Athlete's foot    Chronic systolic heart failure (HCC)    a. 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 CFT <3 seconds. Normal hair growth on digits. No edema.  Skin. No lacerations or abrasions bilateral feet. Right heel ulcer with granular firbrotic base. Measures about about 4 cm x 3  cm x 0.5 cm with tendon exposed.Necrosis over top with no erythema or edema noted No purulence. Does look improved from prior slightly.   Left fifth metatarsal wound  0.5 cm x 1 cm x 0.2 cm. No  erythema edema or purulence noted.  Musculoskeletal: MMT 5/5 bilateral lower extremities in DF, PF, Inversion and Eversion. Deceased ROM in DF of ankle joint.  Neurological: Sensation intact to light touch.   ABIs:   Summary:  Right: Resting right ankle-brachial index indicates moderate right lower  extremity arterial disease. The right toe-brachial index is abnormal.   Left: Resting left ankle-brachial index indicates mild left lower  extremity arterial disease. The left toe-brachial index is abnormal.   Assessment:   1. Ulcer of right heel and midfoot with necrosis of muscle (HCC)   2. Ulcer of left midfoot with fat layer exposed (HCC)   3. PVD (peripheral vascular disease) (HCC)   4. Type 2 diabetes mellitus with stage 3b chronic kidney disease, with long-term current use of insulin (HCC)            Plan:  Patient was evaluated and treated and all questions answered. Ulcer right posterior ankle with muscle exposed, necorsis. Left fifth metatarsal wound with fat layer exposed.  -No debridement on right patient refused.  Left foot wound debrided as below.  -Dressed with betadine, DSD. -Off-loading with surgical shoe on left. Discussed patient should be wearing this shoe as it will help prevent wounds and help keep things clean and free from infection.  Presented today in  just socks again. Outside notes state he has not been wearing shoes -No abx indicated.  -Dr. Chestine Spore preformed angiogram 8/3 with intervention to open up flow in right foot.  -Patient will follow-up with wound care for chronic non-healing wound and possible need for hyperbarics.  -Discussed glucose control and proper protein-rich diet.  -Discussed if any worsening redness, pain, fever or chills to call or may need to report to the emergency room. Patient expressed understanding.  Discussed with location of wound and possible infection and limited blood flow patient is at risk for limb loss.Expressed  understanding.     Procedure: Excisional Debridement of Wound Rationale: Removal of non-viable soft tissue from the wound to promote healing.  Anesthesia: none Pre-Debridement Wound Measurements: Overlying callus on left.  Post-Debridement Wound Measurements: 0.5 cm x 1 cm x 0.2 cm left  Type of Debridement: Sharp Excisional Tissue Removed: Non-viable soft tissue Depth of Debridement: subcutaneous tissue. Technique: Sharp excisional debridement to bleeding, viable wound base.  Dressing: Dry, sterile, compression dressing. Disposition: Patient tolerated procedure well. Patient to return in 3 week for follow-up.  No follow-ups on file.     No follow-ups on file.   Louann Sjogren, DPM

## 2022-07-08 ENCOUNTER — Other Ambulatory Visit: Payer: Self-pay | Admitting: *Deleted

## 2022-07-08 DIAGNOSIS — I70221 Atherosclerosis of native arteries of extremities with rest pain, right leg: Secondary | ICD-10-CM

## 2022-07-13 ENCOUNTER — Other Ambulatory Visit: Payer: Self-pay | Admitting: Cardiology

## 2022-07-13 MED ORDER — WARFARIN SODIUM 6 MG PO TABS: 3.0000 mg | ORAL_TABLET | ORAL | 6 refills | Status: DC

## 2022-07-14 ENCOUNTER — Other Ambulatory Visit: Payer: Self-pay | Admitting: *Deleted

## 2022-07-14 ENCOUNTER — Other Ambulatory Visit (HOSPITAL_COMMUNITY): Payer: Self-pay

## 2022-07-14 ENCOUNTER — Other Ambulatory Visit: Payer: Self-pay

## 2022-07-14 ENCOUNTER — Other Ambulatory Visit (HOSPITAL_COMMUNITY): Payer: Self-pay | Admitting: Internal Medicine

## 2022-07-14 MED ORDER — WARFARIN SODIUM 6 MG PO TABS: 3.0000 mg | ORAL_TABLET | ORAL | 6 refills | Status: DC

## 2022-07-14 MED ORDER — "INSULIN SYRINGE 30G X 5/16"" 1 ML MISC": 1 refills | Status: DC

## 2022-07-14 MED ORDER — SPIRONOLACTONE 25 MG PO TABS: 12.5000 mg | ORAL_TABLET | Freq: Every day | ORAL | 3 refills | Status: DC

## 2022-07-14 MED ORDER — EMPAGLIFLOZIN 10 MG PO TABS: ORAL_TABLET | ORAL | 1 refills | Status: DC

## 2022-07-14 NOTE — Telephone Encounter (Signed)
Patient daughter called requesting refills. 

## 2022-07-15 ENCOUNTER — Other Ambulatory Visit: Payer: Self-pay | Admitting: Family

## 2022-07-16 ENCOUNTER — Ambulatory Visit: Payer: Medicare HMO

## 2022-07-16 ENCOUNTER — Encounter (HOSPITAL_BASED_OUTPATIENT_CLINIC_OR_DEPARTMENT_OTHER): Payer: Medicare HMO | Attending: Internal Medicine | Admitting: Internal Medicine

## 2022-07-16 DIAGNOSIS — L97522 Non-pressure chronic ulcer of other part of left foot with fat layer exposed: Secondary | ICD-10-CM | POA: Diagnosis not present

## 2022-07-16 DIAGNOSIS — I130 Hypertensive heart and chronic kidney disease with heart failure and stage 1 through stage 4 chronic kidney disease, or unspecified chronic kidney disease: Secondary | ICD-10-CM | POA: Diagnosis not present

## 2022-07-16 DIAGNOSIS — G4733 Obstructive sleep apnea (adult) (pediatric): Secondary | ICD-10-CM | POA: Diagnosis not present

## 2022-07-16 DIAGNOSIS — Z8249 Family history of ischemic heart disease and other diseases of the circulatory system: Secondary | ICD-10-CM | POA: Diagnosis not present

## 2022-07-16 DIAGNOSIS — E1151 Type 2 diabetes mellitus with diabetic peripheral angiopathy without gangrene: Secondary | ICD-10-CM | POA: Insufficient documentation

## 2022-07-16 DIAGNOSIS — E1122 Type 2 diabetes mellitus with diabetic chronic kidney disease: Secondary | ICD-10-CM | POA: Insufficient documentation

## 2022-07-16 DIAGNOSIS — I739 Peripheral vascular disease, unspecified: Secondary | ICD-10-CM

## 2022-07-16 DIAGNOSIS — E1142 Type 2 diabetes mellitus with diabetic polyneuropathy: Secondary | ICD-10-CM | POA: Insufficient documentation

## 2022-07-16 DIAGNOSIS — E11621 Type 2 diabetes mellitus with foot ulcer: Secondary | ICD-10-CM | POA: Diagnosis not present

## 2022-07-16 DIAGNOSIS — L97518 Non-pressure chronic ulcer of other part of right foot with other specified severity: Secondary | ICD-10-CM | POA: Diagnosis not present

## 2022-07-16 DIAGNOSIS — N183 Chronic kidney disease, stage 3 unspecified: Secondary | ICD-10-CM | POA: Insufficient documentation

## 2022-07-16 DIAGNOSIS — I5022 Chronic systolic (congestive) heart failure: Secondary | ICD-10-CM | POA: Insufficient documentation

## 2022-07-16 DIAGNOSIS — Z833 Family history of diabetes mellitus: Secondary | ICD-10-CM | POA: Insufficient documentation

## 2022-07-16 NOTE — Progress Notes (Signed)
HUSSIEN, GREENBLATT ([id number]) Visit Report for 07/16/2022 Abuse Risk Screen Details Patient Name: Date of Service: [name] [facility] A. 07/16/2022 9:45 A M Medical Record Number: [id number] Patient Account Number: [id number] Date of Birth/Sex: Treating RN: 11-01-1951 (71 y.o. [name] Primary Care Greer Wainright: Richarda Blade Other Clinician: Referring Madeline Pho: Treating Lavina Resor/Extender: Geralyn Corwin Ngetich, [name] in Treatment: 0 Abuse Risk Screen Items Answer ABUSE RISK SCREEN: Has anyone close to you tried to hurt or harm you recentlyo No Do you feel uncomfortable with anyone in your familyo No Has anyone forced you do things that you didnt want to doo No Electronic Signature(s) Signed: 07/16/2022 4:40:53 PM By: Shawn Stall RN, BSN Entered By: Shawn Stall on 07/16/2022 10:13:38 -------------------------------------------------------------------------------- Activities of Daily Living Details Patient Name: Date of Service: Val Eagle DRO [name] [facility] A. 07/16/2022 9:45 A M Medical Record Number: [id number] Patient Account Number: [id number] Date of Birth/Sex: Treating RN: [birth year] (71 y.o. [name] Primary Care Sudie Bandel: Richarda Blade Other Clinician: Referring Jo Booze: Treating Krew Hortman/Extender: Geralyn Corwin Ngetich, [name] in Treatment: 0 Activities of Daily Living Items Answer Activities of Daily Living (Please select one for each item) Drive Automobile Not Able T Medications ake Completely Able Use T elephone Completely Able Care for Appearance Completely Able Use T oilet Completely Able Bath / Shower Completely Able Dress Self Completely Able Feed Self Completely Able Walk Need Assistance Get In / Out Bed Completely Able Housework Need Assistance Prepare Meals Need Assistance Handle Money Need Assistance Shop for Self Need Assistance Electronic Signature(s) Signed: 07/16/2022 4:40:53 PM By: Shawn Stall RN, BSN Entered By:  Shawn Stall on 07/16/2022 10:13:59 -------------------------------------------------------------------------------- Education Screening Details Patient Name: Date of Service: [name] [name] 07/16/2022 9:45 A M Medical Record Number: [id number] Patient Account Number: [id number] Date of Birth/Sex: Treating RN: 09-Aug-1951 (71 y.o. [name] Primary Care Tommey Barret: Richarda Blade Other Clinician: Referring Katelyne Galster: Treating Munachimso Palin/Extender: Jae Dire in Treatment: 0 Primary Learner Assessed: Patient Learning Preferences/Education Level/Primary Language Learning Preference: Explanation, Demonstration, Printed Material Highest Education Level: College or Above Preferred Language: English Cognitive Barrier Language Barrier: No Translator Needed: No Memory Deficit: No Emotional Barrier: No Cultural/Religious Beliefs Affecting Medical Care: No Physical Barrier Impaired Vision: Yes Glasses, Legally Blind Impaired Hearing: No Decreased Hand dexterity: No Knowledge/Comprehension Knowledge Level: High Comprehension Level: High Ability to understand written instructions: High Ability to understand verbal instructions: High Motivation Anxiety Level: Calm Cooperation: Cooperative Education Importance: Acknowledges Need Interest in Health Problems: Asks Questions Perception: Coherent Willingness to Engage in Self-Management High Activities: Readiness to Engage in Self-Management High Activities: Electronic Signature(s) Signed: 07/16/2022 4:40:53 PM By: Shawn Stall RN, BSN Entered By: Shawn Stall on 07/16/2022 11:06:02 -------------------------------------------------------------------------------- Fall Risk Assessment Details Patient Name: Date of Service: [name] [name] 07/16/2022 9:45 A M Medical Record Number: [id number] Patient Account Number: [id number] Date of Birth/Sex: Treating RN: July 16, 1951 (70 y.o. [name] Primary Care Dietrich Ke: Richarda Blade Other Clinician: Referring Jerri Hargadon: Treating Benjermin Korber/Extender: Geralyn Corwin Ngetich, [name] in Treatment: 0 Fall Risk Assessment Items Have you had 2 or more falls in the last 12 monthso 0 No Have you had any fall that resulted in injury in the last 12 monthso 0 No FALLS RISK SCREEN History of falling - immediate or within 3 months 0 No Secondary diagnosis (Do you have 2 or more medical diagnoseso) 0 No Ambulatory aid None/bed rest/wheelchair/nurse 0 No Crutches/cane/walker 15 Yes Furniture 0 No Intravenous  therapy Access/Saline/Heparin Lock 0 No Gait/Transferring Normal/ bed rest/ wheelchair 0 Yes Weak (short steps with or without shuffle, stooped but able to lift head while walking, may seek 0 No support from furniture) Impaired (short steps with shuffle, may have difficulty arising from chair, head down, impaired 0 No balance) Mental Status Oriented to own ability 0 Yes Electronic Signature(s) Signed: 07/16/2022 4:40:53 PM By: Shawn Stall RN, BSN Entered By: Shawn Stall on 07/16/2022 10:15:06 -------------------------------------------------------------------------------- Foot Assessment Details Patient Name: Date of Service: [name] [name] 07/16/2022 9:45 A M Medical Record Number: [id number] Patient Account Number: [id number] Date of Birth/Sex: Treating RN: December 15, 1950 (71 y.o. [name] Primary Care Ranesha Val: Richarda Blade Other Clinician: Referring Zella Dewan: Treating Kenlea Woodell/Extender: Geralyn Corwin Ngetich, [name] in Treatment: 0 Foot Assessment Items Site Locations + = Sensation present, - = Sensation absent, C = Callus, U = Ulcer R = Redness, W = Warmth, M = Maceration, PU = Pre-ulcerative lesion F = Fissure, S = Swelling, D = Dryness Assessment Right: Left: Other Deformity: No No Prior Foot Ulcer: No No Prior Amputation: No No Charcot Joint: No No Ambulatory Status:  Ambulatory With Help Assistance Device: Cane Gait: Steady Electronic Signature(s) Signed: 07/16/2022 4:40:53 PM By: Shawn Stall RN, BSN Entered By: Shawn Stall on 07/16/2022 10:34:17 -------------------------------------------------------------------------------- Nutrition Risk Screening Details Patient Name: Date of Service: [name] [facility] A. 07/16/2022 9:45 A M Medical Record Number: [id number] Patient Account Number: [id number] Date of Birth/Sex: Treating RN: 05-18-1951 (71 y.o. [name] Primary Care Kerith Sherley: Richarda Blade Other Clinician: Referring Nautica Hotz: Treating Amreen Raczkowski/Extender: Geralyn Corwin Ngetich, [name] in Treatment: 0 Height (in): Weight (lbs): Body Mass Index (BMI): Nutrition Risk Screening Items Score Screening NUTRITION RISK SCREEN: I have an illness or condition that made me change the kind and/or amount of food I eat 2 Yes I eat fewer than two meals per day 0 No I eat few fruits and vegetables, or milk products 0 No I have three or more drinks of beer, liquor or wine almost every day 0 No I have tooth or mouth problems that make it hard for me to eat 0 No I don't always have enough money to buy the food I need 0 No I eat alone most of the time 0 No I take three or more different prescribed or over-the-counter drugs a day 1 Yes Without wanting to, I have lost or gained 10 pounds in the last six months 0 No I am not always physically able to shop, cook and/or feed myself 0 No Nutrition Protocols Good Risk Protocol Provide education on elevated blood Moderate Risk Protocol 0 sugars and impact on wound healing, as applicable High Risk Proctocol Risk Level: Moderate Risk Score: 3 Electronic Signature(s) Signed: 07/16/2022 4:40:53 PM By: Shawn Stall RN, BSN Entered By: Shawn Stall on 07/16/2022 10:10:11

## 2022-07-16 NOTE — Progress Notes (Signed)
[name], [name] Visit Report for 07/16/2022 Chief Complaint Document Details Patient Name: Date of Service: [name] [facility] A. 07/16/2022 9:45 A M Medical Record Number: [id number] Patient Account Number: [id number] Date of Birth/Sex: Treating RN: Oct 01, 1951 (71 y.o. M) Primary Care Provider: [name] Other Clinician: Referring Provider: Treating Provider/Extender: [name], [name] in Treatment: 0 Information Obtained from: Patient Chief Complaint 07/16/2022; bilateral lower extremity wounds Electronic Signature(s) Signed: 07/16/2022 1:45:58 PM By: [name] DO Entered By: [name] on 07/16/2022 11:49:35 -------------------------------------------------------------------------------- Debridement Details Patient Name: Date of Service: Darren Nip [name] 07/16/2022 9:45 A M Medical Record Number: [id number] Patient Account Number: [id number] Date of Birth/Sex: Treating RN: October 22, 1951 (71 y.o. [name] Primary Care Provider: [name] Other Clinician: Referring Provider: Treating Provider/Extender: [name] Ngetich, [name] in Treatment: 0 Debridement Performed for Assessment: Wound #2 Left,Plantar Foot Performed By: Physician [name], DO Debridement Type: Debridement Severity of Tissue Pre Debridement: Fat layer exposed Level of Consciousness (Pre-procedure): Awake and Alert Pre-procedure Verification/Time Out Yes - 11:00 Taken: Start Time: 11:01 Pain Control: Lidocaine 5% topical ointment T Area Debrided (L x W): otal 1 (cm) x 2 (cm) = 2 (cm) Tissue and other material debrided: Viable, Non-Viable, Callus, Slough, Subcutaneous, Skin: Dermis , Skin: Epidermis, Slough Level: Skin/Subcutaneous Tissue Debridement Description: Excisional Instrument: Curette Bleeding: Minimum Hemostasis Achieved: Pressure End Time: 11:05 Procedural Pain: 0 Post Procedural Pain: 0 Response to Treatment:  Procedure was tolerated well Level of Consciousness (Post- Awake and Alert procedure): Post Debridement Measurements of Total Wound Length: (cm) 0.4 Width: (cm) 0.7 Depth: (cm) 0.2 Volume: (cm) 0.044 Character of Wound/Ulcer Post Debridement: Improved Severity of Tissue Post Debridement: Fat layer exposed Post Procedure Diagnosis Same as Pre-procedure Electronic Signature(s) Signed: 07/16/2022 1:45:58 PM By: [name] DO Signed: 07/16/2022 4:40:53 PM By: Deon Pilling RN, BSN Entered By: Deon Pilling on 07/16/2022 11:05:22 -------------------------------------------------------------------------------- Debridement Details Patient Name: Date of Service: Darren Nip [name] 07/16/2022 9:45 A M Medical Record Number: [id number] Patient Account Number: [id number] Date of Birth/Sex: Treating RN: 1951/06/02 (71 y.o. [name] Primary Care Provider: [name] Other Clinician: Referring Provider: Treating Provider/Extender: [name] Ngetich, [name] in Treatment: 0 Debridement Performed for Assessment: Wound #1 Right Achilles Performed By: Clinician Deon Pilling, RN Debridement Type: Chemical/Enzymatic/Mechanical Agent Used: gauze and wound cleanser Severity of Tissue Pre Debridement: Fat layer exposed Level of Consciousness (Pre-procedure): Awake and Alert Pre-procedure Verification/Time Out No Taken: Bleeding: None Response to Treatment: Procedure was tolerated well Level of Consciousness (Post- Awake and Alert procedure): Post Debridement Measurements of Total Wound Length: (cm) 6.5 Width: (cm) 4 Depth: (cm) 0.5 Volume: (cm) 10.21 Character of Wound/Ulcer Post Debridement: Requires Further Debridement Severity of Tissue Post Debridement: Necrosis of muscle Post Procedure Diagnosis Same as Pre-procedure Electronic Signature(s) Signed: 07/16/2022 1:45:58 PM By: [name] DO Signed: 07/16/2022 4:40:53 PM By: Deon Pilling RN,  BSN Entered By: Deon Pilling on 07/16/2022 11:21:17 -------------------------------------------------------------------------------- HPI Details Patient Name: Date of Service: Darren Nip [name] 07/16/2022 9:45 A M Medical Record Number: [id number] Patient Account Number: [id number] Date of Birth/Sex: Treating RN: Nov 19, 1950 (72 y.o. 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 He is not sure how the wounds started. He has been following with podiatry for this issue. He had removal of the tendon to the right lower extremity by Dr. Blenda Mounts. He has been using Betadine to the wound beds. On 06/11/2022 he had a right posterior tibial artery angioplasty by Dr. Carlis Abbott. It was reported the patient is optimized after revascularization of the right lower extremity. His ABIs on the left were 0.91. He currently denies systemic signs of infection. He also does not wear shoes. He came in with Kerlix wrap to his feet bilaterally. This did not completely cover his feet. Electronic Signature(s) Signed: 07/16/2022 1:45:58 PM By: [name] DO Entered By: [name] on 07/16/2022 13:41:01 -------------------------------------------------------------------------------- Physical Exam Details Patient Name: Date of Service: Darren Nip [facility] A. 07/16/2022 9:45 A M Medical Record Number: [id number] Patient  Account Number: [id number] Date of Birth/Sex: Treating RN: 26-Oct-1951 (71 y.o. M) Primary Care Provider: [name] Other Clinician: Referring Provider: Treating Provider/Extender: [name] Ngetich, [name] in Treatment: 0 Constitutional respirations regular, non-labored and within target range for patient.. Cardiovascular 2+ dorsalis pedis/posterior tibialis pulses. Psychiatric pleasant and cooperative. Notes Right lower extremity: T the posterior aspect over the Achilles region there is a large open wound with nonviable tissue, necrotic tissue and abutting granulation o tissue. No surrounding signs of soft tissue infection including increased warmth, erythema or purulent drainage. Left lower extremity: T the lateral plantar aspect of the foot there is an open wound with nonviable tissue and granulation tissue present. T the left fourth toe o o there is a small area of skin breakdown. Again no signs of surrounding infection to the left lower extremity. Electronic Signature(s) Signed: 07/16/2022 1:45:58 PM By: [name] DO Entered By: [name] on 07/16/2022 11:55:41 -------------------------------------------------------------------------------- Physician Orders Details Patient Name: Date of Service: Darren Nip [facility] A. 07/16/2022 9:45 A M Medical Record Number: [id number] Patient Account Number: [id number] Date of Birth/Sex: Treating RN: Jun 23, 1951 (71 y.o. 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 - Dr. Celine Ahr next week to debride the to right achilles wound. Return A ppointment in 2 weeks. - Dr. Heber [place] and Tammi Klippel, Room 8 Return A Other: - [facility]- will ship wound care supplies. [employer] has Dakin's Solutions if your pharmacy does not have. Anesthetic (In clinic) Topical Lidocaine 5% applied to wound bed Bathing/ Shower/ Hygiene May shower with protection but do not get wound dressing(s) wet. Edema Control - Lymphedema / SCD / Other Elevate legs to the level of the heart or above for 30 minutes daily and/or when sitting, a frequency of: - 3-4 times a day throughout the day. Avoid standing for long periods of time. Off-Loading Other: - minimize walking and standing to aid in offloading pressure to wound. Wound Treatment Wound #1 - Achilles Wound Laterality: Right Cleanser: Wound Cleanser 1 x Per Day/30 Days  Discharge Instructions: Cleanse the wound with wound cleanser prior to applying a clean dressing using gauze sponges, not tissue or cotton balls. Prim Dressing: Dakin's Solution 0.25%, 16 (oz) 1 x Per Day/30 Days ary Discharge Instructions: Moisten gauze with Dakin's solution Secondary Dressing: ABD Pad, 5x9 1 x Per Day/30 Days Discharge Instructions: Apply over primary dressing as directed. Secondary Dressing: Woven Gauze Sponge, Non-Sterile 4x4 in 1 x Per Day/30 Days Discharge Instructions: Apply over primary dressing as directed. Secured With: [employer], 4.5x3.1 (in/yd) 1 x Per Day/30 Days Discharge Instructions: Secure with Kerlix as directed. Secured With: 1M Medipore H Soft Cloth Surgical T ape, 4 x 10 (in/yd) 1 x Per Day/30 Days Discharge Instructions: Secure with tape as directed. Wound #2 - Foot Wound Laterality: Plantar, Left Cleanser: Soap and Water 1 x Per Day/30 Days Discharge Instructions: May shower and wash wound with dial antibacterial soap and water prior to dressing change. Cleanser: Wound Cleanser 1 x  Per Day/30 Days Discharge Instructions: Cleanse the wound with wound cleanser prior to applying a clean dressing using gauze sponges, not tissue or cotton balls. Peri-Wound Care: Skin Prep 1 x Per Day/30 Days Discharge Instructions: Use skin prep as directed Prim Dressing: MediHoney Gel, tube 1.5 (oz) 1 x Per Day/30 Days ary Discharge Instructions: Apply to wound bed as instructed Secondary Dressing: Optifoam Non-Adhesive Dressing, 4x4 in 1 x Per Day/30 Days Discharge Instructions: Apply over primary dressing as directed. Secondary Dressing: Woven Gauze Sponges 2x2 in 1 x Per Day/30 Days Discharge Instructions: Apply over primary dressing as directed. Secured With: [employer], 4.5x3.1 (in/yd) 1 x Per Day/30 Days Discharge Instructions: Secure with Kerlix as directed. Secured With: 1M Medipore H Soft Cloth Surgical T ape, 4 x 10 (in/yd) 1 x Per Day/30 Days Discharge Instructions: Secure with tape as directed. Wound #3 - T Fourth oe Wound Laterality: Left Cleanser: Soap and Water 1 x Per Day/30 Days Discharge Instructions: May shower and wash wound with dial antibacterial soap and water prior to dressing change. Cleanser: Wound Cleanser 1 x Per Day/30 Days Discharge Instructions: Cleanse the wound with wound cleanser prior to applying a clean dressing using gauze sponges, not tissue or cotton balls. Topical: bactracin antibiotic ointment 1 x Per Day/30 Days Discharge Instructions: apply daily. Secondary Dressing: bandaid 1 x Per Day/30 Days Discharge Instructions: apply over the antibiotic ointment. Patient Medications llergies: No Known Drug Allergies A Notifications Medication Indication Start End 07/16/2022 lidocaine DOSE topical 5 % gel - gel topical applied only in office in debridement 07/16/2022 Dakin's Solution DOSE 1 - miscellaneous 0.25 % solution - moisten gauze for wet to dry dressings Electronic Signature(s) Signed: 07/16/2022 1:45:58 PM By: Geralyn Corwin  DO Previous Signature: 07/16/2022 11:30:02 AM Version By: Geralyn Corwin DO Entered By: Geralyn Corwin on 07/16/2022 11:55:50 -------------------------------------------------------------------------------- Problem List Details Patient Name: Date of Service: Darren Crook [name] 07/16/2022 9:45 A M Medical Record Number: [id number] Patient Account Number: [id number] Date of Birth/Sex: Treating RN: 19-Jul-1951 (71 y.o. M) Primary Care Provider: Richarda Blade Other Clinician: Referring Provider: Treating Provider/Extender: Velna Ochs, [name] in Treatment: 0 Active Problems ICD-10 Encounter Code Description Active Date MDM Diagnosis L97.518 Non-pressure chronic ulcer of other part of right foot with other specified 07/16/2022 No Yes severity L97.522 Non-pressure chronic ulcer of other part of left foot with fat layer exposed 07/16/2022 No Yes E11.621 Type 2 diabetes mellitus with foot ulcer 07/16/2022 No Yes I73.9 Peripheral vascular disease, unspecified 07/16/2022 No Yes Inactive  Problems Resolved Problems Electronic Signature(s) Signed: 07/16/2022 1:45:58 PM By: Geralyn Corwin DO Entered By: Geralyn Corwin on 07/16/2022 13:44:32 -------------------------------------------------------------------------------- Progress Note Details Patient Name: Date of Service: Darren Crook [facility] A. 07/16/2022 9:45 A M Medical Record Number: [id number] Patient Account Number: [id number] Date of Birth/Sex: Treating RN: 1951/09/09 (71 y.o. M) Primary Care Provider: Richarda Blade Other Clinician: Referring Provider: Treating Provider/Extender: Velna Ochs, [name] in Treatment: 0 Subjective Chief Complaint Information obtained from Patient 07/16/2022; bilateral lower extremity wounds History of Present Illness (HPI) Admission 07/16/2022 [name] is a 71 year old male with a past medical history of insulin-dependent currently controlled type 2 diabetes  complicated by peripheral neuropathy, chronic systolic heart failure, obstructive sleep apnea, and peripheral vascular disease that presents to the clinic for a several month history of nonhealing ulcer to the back of the right leg and a 30-month history of nonhealing wounds to the left foot. He is not sure how the wounds started. He has been following with podiatry for this issue. He had removal of the tendon to the right lower extremity by Dr. Ralene Cork. He has been using Betadine to the wound beds. On 06/11/2022 he had a right posterior tibial artery angioplasty by Dr. Chestine Spore. It was reported the patient is optimized after revascularization of the right lower extremity. His ABIs on the left were 0.91. He currently denies systemic signs of infection. He also does not wear shoes. He came in with Kerlix wrap to his feet bilaterally. This did not completely cover his feet. Patient History Information obtained from Chart. Allergies No Known Drug Allergies Family History Diabetes - Mother, Heart Disease - Mother,Siblings. Social History Current every day smoker, Alcohol Use - Never, Drug Use - No History, Caffeine Use - Never. Medical History Respiratory Patient has history of Sleep Apnea - does not wear Cardiovascular Patient has history of Arrhythmia - A. Fib, Congestive Heart Failure - EF 25%, Hypertension, Peripheral Arterial Disease Denies history of Peripheral Venous Disease Endocrine Patient has history of Type II Diabetes Patient is treated with Insulin. Blood sugar is not tested. Hospitalization/Surgery History - Angiogram 06/11/2022 Dr. Chestine Spore [facility]. - cardioversion 11/06/2021, 2015, 2017. 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 Medical A Surgical History Notes nd Gastrointestinal Ascites Genitourinary stage III Kidney disease Review of Systems (ROS) Eyes Complains or has symptoms of Glasses / Contacts - glasses-legally blind. Integumentary (Skin) Complains or has symptoms of Wounds -  right heel. Objective Constitutional respirations regular, non-labored and within target range for patient.. Vitals Time Taken: 10:05 AM, Height: 68 in, Source: Stated, Weight: 185 lbs, Source: Stated, BMI: 28.1, Temperature: 98.1 F, Pulse: 106 bpm, Respiratory Rate: 20 breaths/min, Blood Pressure: 166/91 mmHg. Cardiovascular 2+ dorsalis pedis/posterior tibialis pulses. Psychiatric pleasant and cooperative. General Notes: Right lower extremity: T the posterior aspect over the Achilles region there is a large open wound with nonviable tissue, necrotic tissue and o abutting granulation tissue. No surrounding signs of soft tissue infection including increased warmth, erythema or purulent drainage. Left lower extremity: T o the lateral plantar aspect of the foot there is an open wound with nonviable tissue and granulation tissue present. T the left fourth toe there is a small area of o skin breakdown. Again no signs of surrounding infection to the left lower extremity. Integumentary (Hair, Skin) Wound #1 status is Open. Original cause of wound was Surgical Injury. The date acquired was: 07/16/2022. The wound is located on the Right Achilles. The wound measures 6.5cm length x 4cm width x  0.5cm depth; 20.42cm^2 area and 10.21cm^3 volume. There is Fat Layer (Subcutaneous Tissue) exposed. There is no tunneling or undermining noted. There is a medium amount of serosanguineous drainage noted. Foul odor after cleansing was noted. The wound margin is distinct with the outline attached to the wound base. There is small (1-33%) red, pink granulation within the wound bed. There is a large (67-100%) amount of necrotic tissue within the wound bed including Adherent Slough. Wound #2 status is Open. Original cause of wound was Gradually Appeared. The date acquired was: 03/09/2022. The wound is located on the [place]. The wound measures 0.4cm length x 0.7cm width x 0.3cm depth; 0.22cm^2 area and 0.066cm^3  volume. There is Fat Layer (Subcutaneous Tissue) exposed. There is no tunneling noted, however, there is undermining starting at 12:00 and ending at 12:00 with a maximum distance of 1cm. There is a medium amount of serosanguineous drainage noted. The wound margin is distinct with the outline attached to the wound base. There is large (67-100%) pink, pale granulation within the wound bed. There is no necrotic tissue within the wound bed. General Notes: callous periwound. Wound #3 status is Open. Original cause of wound was Gradually Appeared. The date acquired was: 07/13/2022. The wound is located on the Left T Fourth. The oe wound measures 0.4cm length x 0.5cm width x 0.1cm depth; 0.157cm^2 area and 0.016cm^3 volume. There is Fat Layer (Subcutaneous Tissue) exposed. There is no tunneling or undermining noted. There is a medium amount of serosanguineous drainage noted. The wound margin is distinct with the outline attached to the wound base. There is large (67-100%) red granulation within the wound bed. There is no necrotic tissue within the wound bed. 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 It is unclear how the wound started but are nonhealing likely due to arterial insufficiency on the right side and then pressure on the left side. He has been following with podiatry for this issue. He has extensive necrotic debris and slough throughout the wound bed on the right side. I consulted Dr. Celine Ahr for debridement. She will see the patient next week to have this area debrided. In the meantime I recommended Dakin's wet-to-dry dressing to this area. T the left foot  I debrided o nonviable tissue and recommended Medihoney to the plantar foot wound and antibiotic ointment, bacitracin to the toe wound. I recommended aggressive offloading and we will give him the felt pad. I recommended he start wearing shoes. For now there is no evidence of active infection. Follow-up in 1 week. Procedures Wound #1 Pre-procedure diagnosis of Wound #1 is a Diabetic Wound/Ulcer of the Lower Extremity located on the Right Achilles .Severity of Tissue Pre Debridement is: Fat layer exposed. There was a Chemical/Enzymatic/Mechanical debridement performed by Deon Pilling, RN.. Other agent used was gauze and wound cleanser. There was no bleeding. The procedure was tolerated well. Post Debridement Measurements: 6.5cm length x 4cm width x 0.5cm depth; 10.21cm^3 volume. Character of Wound/Ulcer Post Debridement requires further debridement. Severity of Tissue Post Debridement is: Necrosis of muscle. Post procedure Diagnosis Wound #1: Same as Pre-Procedure Wound #2 Pre-procedure diagnosis of Wound #2 is a Diabetic Wound/Ulcer of the Lower Extremity located  on the [place] .Severity of Tissue Pre Debridement is: Fat layer exposed. There was a Excisional Skin/Subcutaneous Tissue Debridement with a total area of 2 sq cm performed by [name], DO. With the following instrument(s): Curette to remove Viable and Non-Viable tissue/material. Material removed includes Callus, Subcutaneous Tissue, Slough, Skin: Dermis, and Skin: Epidermis after achieving pain control using Lidocaine 5% topical ointment. A time out was conducted at 11:00, prior to the start of the procedure. A Minimum amount of bleeding was controlled with Pressure. The procedure was tolerated well with a pain level of 0 throughout and a pain level of 0 following the procedure. Post Debridement Measurements: 0.4cm length x 0.7cm width x 0.2cm depth; 0.044cm^3 volume. Character of Wound/Ulcer Post Debridement is  improved. Severity of Tissue Post Debridement is: Fat layer exposed. Post procedure Diagnosis Wound #2: Same as Pre-Procedure Plan Follow-up Appointments: Return Appointment in 1 week. - Dr. Celine Ahr next week to debride the to right achilles wound. Return Appointment in 2 weeks. - Dr. Heber Gem and Tammi Klippel, Room 8 Other: - [facility]- will ship wound care supplies. [employer] has Dakin's Solutions if your pharmacy does not have. Anesthetic: (In clinic) Topical Lidocaine 5% applied to wound bed Bathing/ Shower/ Hygiene: May shower with protection but do not get wound dressing(s) wet. Edema Control - Lymphedema / SCD / Other: Elevate legs to the level of the heart or above for 30 minutes daily and/or when sitting, a frequency of: - 3-4 times a day throughout the day. Avoid standing for long periods of time. Off-Loading: Other: - minimize walking and standing to aid in offloading pressure to wound. The following medication(s) was prescribed: lidocaine topical 5 % gel gel topical applied only in office in debridement was prescribed at facility Dakin's Solution miscellaneous 0.25 % solution 1 moisten gauze for wet to dry dressings starting 07/16/2022 WOUND #1: - Achilles Wound Laterality: Right Cleanser: Wound Cleanser 1 x Per Day/30 Days Discharge Instructions: Cleanse the wound with wound cleanser prior to applying a clean dressing using gauze sponges, not tissue or cotton balls. Prim Dressing: Dakin's Solution 0.25%, 16 (oz) 1 x Per Day/30 Days ary Discharge Instructions: Moisten gauze with Dakin's solution Secondary Dressing: ABD Pad, 5x9 1 x Per Day/30 Days Discharge Instructions: Apply over primary dressing as directed. Secondary Dressing: Woven Gauze Sponge, Non-Sterile 4x4 in 1 x Per Day/30 Days Discharge Instructions: Apply over primary dressing as directed. Secured With: [employer], 4.5x3.1 (in/yd) 1 x Per Day/30 Days Discharge Instructions: Secure with Kerlix as  directed. Secured With: 63M Medipore H Soft Cloth Surgical T ape, 4 x 10 (in/yd) 1 x Per Day/30 Days Discharge Instructions: Secure with tape as directed. WOUND #2: - Foot Wound Laterality: Plantar, Left Cleanser: Soap and Water 1 x Per Day/30 Days Discharge Instructions: May shower and wash wound with dial antibacterial soap and water prior to dressing change. Cleanser: Wound Cleanser 1 x Per Day/30 Days Discharge Instructions: Cleanse the wound with wound cleanser prior to applying a clean dressing using gauze sponges, not tissue or cotton balls. Peri-Wound Care: Skin Prep 1 x Per Day/30 Days Discharge Instructions: Use skin prep as directed Prim Dressing: MediHoney Gel, tube 1.5 (oz) 1 x Per Day/30 Days ary Discharge Instructions: Apply to wound bed as instructed Secondary Dressing: Optifoam Non-Adhesive Dressing, 4x4 in 1 x Per Day/30 Days Discharge Instructions: Apply over primary dressing as directed. Secondary Dressing: Woven Gauze Sponges 2x2 in 1 x Per Day/30 Days Discharge Instructions: Apply over primary dressing as  directed. Secured With: [employer], 4.5x3.1 (in/yd) 1 x Per Day/30 Days Discharge Instructions: Secure with Kerlix as directed. Secured With: 22M Medipore H Soft Cloth Surgical T ape, 4 x 10 (in/yd) 1 x Per Day/30 Days Discharge Instructions: Secure with tape as directed. WOUND #3: - T Fourth Wound Laterality: Left oe Cleanser: Soap and Water 1 x Per Day/30 Days Discharge Instructions: May shower and wash wound with dial antibacterial soap and water prior to dressing change. Cleanser: Wound Cleanser 1 x Per Day/30 Days Discharge Instructions: Cleanse the wound with wound cleanser prior to applying a clean dressing using gauze sponges, not tissue or cotton balls. Topical: bactracin antibiotic ointment 1 x Per Day/30 Days Discharge Instructions: apply daily. Secondary Dressing: bandaid 1 x Per Day/30 Days Discharge Instructions: apply over the antibiotic  ointment. 1. In office sharp debridement 2. Medihoney 3. Bacitracin 4. Dakin's wet-to-dry dressings 5. Follow-up in 1 week Electronic Signature(s) Signed: 07/16/2022 1:45:58 PM By: [name] DO Entered By: [name] on 07/16/2022 13:43:04 -------------------------------------------------------------------------------- HxROS Details Patient Name: Date of Service: Darren Nip [name] 07/16/2022 9:45 A M Medical Record Number: [id number] Patient Account Number: [id number] Date of Birth/Sex: Treating RN: 1951/07/09 (71 y.o. [name] Primary Care Provider: [name] Other Clinician: Referring Provider: Treating Provider/Extender: [name] Ngetich, [name] in Treatment: 0 Information Obtained From Chart Eyes Complaints and Symptoms: Positive for: Glasses / Contacts - glasses-legally blind Integumentary (Skin) Complaints and Symptoms: Positive for: Wounds - right heel Respiratory Medical History: Positive for: Sleep Apnea - does not wear Cardiovascular Medical History: Positive for: Arrhythmia - A. Fib; Congestive Heart Failure - EF 25%; Hypertension; Peripheral Arterial Disease Negative for: Peripheral Venous Disease Gastrointestinal Medical History: Past Medical History Notes: Ascites Endocrine Medical History: Positive for: Type II Diabetes Time with diabetes: 6 years Treated with: Insulin Blood sugar tested every day: No Genitourinary Medical History: Past Medical History Notes: stage III Kidney disease Immunizations Pneumococcal Vaccine: Received Pneumococcal Vaccination: No Implantable Devices No devices added Hospitalization / Surgery History Type of Hospitalization/Surgery Angiogram 06/11/2022 Dr. 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 Family and Social History Diabetes: Yes - Mother; Heart Disease: Yes - Mother,Siblings; Current every day smoker; Alcohol Use: Never; Drug Use:  No History; Caffeine Use: Never; Financial Concerns: No; Food, Clothing or Shelter Needs: No; Support System Lacking: No; Transportation Concerns: No Electronic Signature(s) Signed: 07/16/2022 1:45:58 PM By: [name] DO Signed: 07/16/2022 4:40:53 PM By: Deon Pilling RN, BSN Entered By: Deon Pilling on 07/16/2022 11:05:45 -------------------------------------------------------------------------------- SuperBill Details Patient Name: Date of Service: Darren Nip [name] 07/16/2022 Medical Record Number: [id number] Patient Account Number: [id number] Date of Birth/Sex: Treating RN: 04/16/1951 (71 y.o. 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 E11.621 Type 2 diabetes mellitus with foot ulcer I73.9 Peripheral vascular disease, unspecified Facility Procedures CPT4 Code: [id number] Description: [id number] - WOUND CARE VISIT-LEV 3 EST PT Modifier: Quantity: 1 CPT4 Code: [id number] Description: [id number] - DEB SUBQ TISSUE 20 SQ CM/< ICD-10 Diagnosis Description L97.518 Non-pressure chronic ulcer of other part of right foot with other specified sever Modifier: ity Quantity: 1 CPT4 Code: [id number] Description: [id number] - DEBRIDE W/O ANES NON SELECT ICD-10 Diagnosis  Description L97.518 Non-pressure chronic ulcer of other part of right foot with other specified sever Modifier: ity Quantity: 1 Physician Procedures : CPT4 Code Description Modifier [id number] - WC PHYS LEVEL 4 - NEW PT ICD-10 Diagnosis Description L97.518 Non-pressure chronic ulcer of other part of right foot with other specified severity L97.522 Non-pressure chronic ulcer of other  part of left  foot with fat layer exposed E11.621 Type 2 diabetes mellitus with foot ulcer I73.9 Peripheral vascular disease, unspecified Quantity: 1 : [id number] - WC PHYS SUBQ TISS 20 SQ CM ICD-10 Diagnosis Description L97.518 Non-pressure chronic ulcer of other part of right foot with other specified severity Quantity: 1 Electronic Signature(s) Signed: 07/16/2022 1:45:58 PM By: [name] DO Entered By: [name] on 07/16/2022 13:45:24

## 2022-07-17 ENCOUNTER — Ambulatory Visit: Payer: Medicare HMO

## 2022-07-17 NOTE — Progress Notes (Signed)
[name], [name] ([id number]) Visit Report for 07/16/2022 Allergy List Details Patient Name: Date of Service: Darren DRO [name] [facility] A. 07/16/2022 9:45 A M Medical Record Number: [id number] Patient Account Number: [id number] Date of Birth/Sex: Treating RN: 1951-04-29 (71 y.[name] Primary Care [name]: [name] Other Clinician: Referring [name]: Treating [name]/Extender: [name], [name] in Treatment: 0 Allergies Active Allergies No Known Drug Allergies Allergy Notes Electronic Signature(s) Signed: 07/16/2022 4:40:53 PM By: Deon Pilling RN, BSN Entered By: Deon Pilling on 07/15/2022 17:36:20 -------------------------------------------------------------------------------- Arrival Information Details Patient Name: Date of Service: [name] [name] 07/16/2022 9:45 A M Medical Record Number: [id number] Patient Account Number: [id number] Date of Birth/Sex: Treating RN: 1951/09/24 (71 y.[name] Primary Care Avalyn Molino: [name] Other Clinician: Referring [name]: Treating [name]/Extender: [name], [name] in Treatment: 0 Visit Information Patient Arrived: [name] Time: 10:03 Accompanied By: daughter Transfer Assistance: None Patient Identification Verified: Yes Secondary Verification Process Completed: Yes Patient Requires Transmission-Based Precautions: No Patient Has Alerts: Yes Patient Alerts: Patient on Blood Thinner Electronic Signature(s) Signed: 07/16/2022 4:40:53 PM By: Deon Pilling RN, BSN Entered By: Deon Pilling on 07/16/2022 10:08:31 -------------------------------------------------------------------------------- Clinic Level of Care Assessment Details Patient Name: Date of Service: [name] A. 07/16/2022 9:45 A M Medical Record Number: [id number] Patient Account Number: [id number] Date of Birth/Sex: Treating RN: 01-06-1951 (71 y.[name] Primary Care  [name]: [name] Other Clinician: Referring [name]: Treating [name]/Extender: [name], [name] in Treatment: 0 Clinic Level of Care Assessment Items TOOL 1 Quantity Score X- 1 0 Use when EandM and Procedure is performed on INITIAL visit ASSESSMENTS - Nursing Assessment / Reassessment X- 1 20 General Physical Exam (combine w/ comprehensive assessment (listed just below) when performed on new pt. evals) X- 1 25 Comprehensive Assessment (HX, ROS, Risk Assessments, Wounds Hx, etc.) ASSESSMENTS - Wound and Skin Assessment / Reassessment X- 1 10 Dermatologic / Skin Assessment (not related to wound area) ASSESSMENTS - Ostomy and/or Continence Assessment and Care []  - 0 Incontinence Assessment and Management []  - 0 Ostomy Care Assessment and Management (repouching, etc.) PROCESS - Coordination of Care []  - 0 Simple Patient / Family Education for ongoing care X- 1 20 Complex (extensive) Patient / Family Education for ongoing care X- 1 10 Staff obtains Programmer, systems, Records, T Results / Process Orders est []  - 0 Staff telephones HHA, Nursing Homes / Clarify orders / etc []  - 0 Routine Transfer to another Facility (non-emergent condition) []  - 0 Routine Hospital Admission (non-emergent condition) X- 1 15 New Admissions / Biomedical engineer / Ordering NPWT Apligraf, etc. , []  - 0 Emergency Hospital Admission (emergent condition) PROCESS - Special Needs []  - 0 Pediatric / Minor Patient Management []  - 0 Isolation Patient Management []  - 0 Hearing / Language / Visual special needs []  - 0 Assessment of Community assistance (transportation, D/C planning, etc.) []  - 0 Additional assistance / Altered mentation []  - 0 Support Surface(s) Assessment (bed, cushion, seat, etc.) INTERVENTIONS - Miscellaneous []  - 0 External ear exam []  - 0 Patient Transfer (multiple staff / Civil Service fast streamer / Similar devices) []  - 0 Simple Staple / Suture removal (25  or less) []  - 0 Complex Staple / Suture removal (26 or more) []  - 0 Hypo/Hyperglycemic Management (do not check if billed separately) []  - 0 Ankle / Brachial Index (ABI) - do not check if billed separately Has the patient been seen at the hospital  within the last three years: Yes Total Score: 100 Level Of Care: New/Established - Level 3 Electronic Signature(s) Signed: 07/16/2022 4:40:53 PM By: Darren Stall RN, BSN Entered By: [name] on 07/16/2022 13:16:14 -------------------------------------------------------------------------------- Encounter Discharge Information Details Patient Name: Date of Service: [name] [name] 07/16/2022 9:45 A M Medical Record Number: [id number] Patient Account Number: [id number] Date of Birth/Sex: Treating RN: 20-Nov-1950 (71 y.Darren. [name] Primary Care [name]: [name] Other Clinician: Referring [name]: Treating [name]/Extender: [name], [name] in Treatment: 0 Encounter Discharge Information Items Post Procedure Vitals Discharge Condition: Stable Temperature (F): 98.1 Ambulatory Status: Cane Pulse (bpm): 106 Discharge Destination: Home Respiratory Rate (breaths/min): 20 Transportation: Private Auto Blood Pressure (mmHg): 166/91 Accompanied By: daughter Schedule Follow-up Appointment: Yes Clinical Summary of Care: Electronic Signature(s) Signed: 07/16/2022 4:40:53 PM By: Darren Stall RN, BSN Entered By: [name] on 07/16/2022 13:17:22 -------------------------------------------------------------------------------- Lower Extremity Assessment Details Patient Name: Date of Service: [name] A. 07/16/2022 9:45 A M Medical Record Number: [id number] Patient Account Number: [id number] Date of Birth/Sex: Treating RN: 08-Oct-1951 (71 y.Darren. [name] Primary Care Tahji : [name] Other Clinician: Referring Ambar Raphael: Treating Vale Peraza/Extender: [name],  [name] in Treatment: 0 Edema Assessment Assessed: [Left: Yes] [Right: Yes] Edema: [Left: Yes] [Right: Yes] Calf Left: Right: Point of Measurement: 39 cm From Medial Instep 38 cm 39 cm Ankle Left: Right: Point of Measurement: 8 cm From Medial Instep 27.6 cm 26 cm Knee To Floor Left: Right: From Medial Instep 44 cm 44 cm Vascular Assessment Pulses: Dorsalis Pedis Palpable: [Left:Yes] [Right:Yes] Electronic Signature(s) Signed: 07/16/2022 4:40:53 PM By: Darren Stall RN, BSN Entered By: [name] on 07/16/2022 10:31:24 -------------------------------------------------------------------------------- Multi Wound Chart Details Patient Name: Date of Service: [name] [name] 07/16/2022 9:45 A M Medical Record Number: [id number] Patient Account Number: [id number] Date of Birth/Sex: Treating RN: 05-11-1951 (20 y.Darren. M) Primary Care Amoy Steeves: [name] Other Clinician: Referring Ardel Jagger: Treating Lavonda Thal/Extender: [name], [name] in Treatment: 0 Vital Signs Height(in): 68 Pulse(bpm): 106 Weight(lbs): 185 Blood Pressure(mmHg): 166/91 Body Mass Index(BMI): 28.1 Temperature(F): 98.1 Respiratory Rate(breaths/min): 20 Photos: Right Achilles Left, Plantar Foot Left T Fourth oe Wound Location: Surgical Injury Gradually Appeared Gradually Appeared Wounding Event: Diabetic Wound/Ulcer of the Lower Diabetic Wound/Ulcer of the Lower Diabetic Wound/Ulcer of the Lower Primary Etiology: Extremity Extremity Extremity Sleep Apnea, Arrhythmia, Congestive Sleep Apnea, Arrhythmia, Congestive Sleep Apnea, Arrhythmia, Congestive Comorbid History: Heart Failure, Hypertension, Peripheral Heart Failure, Hypertension, Peripheral Heart Failure, Hypertension, Peripheral Arterial Disease, Type II Diabetes Arterial Disease, Type II Diabetes Arterial Disease, Type II Diabetes 07/16/2022 03/09/2022 07/13/2022 Date Acquired: 0 0 0 [name] of Treatment: Open Open  Open Wound Status: No No No Wound Recurrence: 6.5x4x0.5 0.4x0.7x0.3 0.4x0.5x0.1 Measurements L x W x D (cm) 20.42 0.22 0.157 A (cm) : rea 10.21 0.066 0.016 Volume (cm) : 0.00% 0.00% 0.00% % Reduction in A rea: 0.00% 0.00% 0.00% % Reduction in Volume: 12 Starting Position 1 (Darren'clock): 12 Ending Position 1 (Darren'clock): 1 Maximum Distance 1 (cm): No Yes No Undermining: Grade 2 Grade 2 Grade 1 Classification: Medium Medium Medium Exudate A mount: Serosanguineous Serosanguineous Serosanguineous Exudate Type: red, brown red, brown red, brown Exudate Color: Yes No No Foul Odor A Cleansing: fter No N/A N/A Odor A nticipated Due to Product Use: Distinct, outline attached Distinct, outline attached Distinct, outline attached Wound Margin: Small (1-33%) Large (67-100%) Large (67-100%) Granulation A mount: Red, Pink Pink, Pale Red Granulation Quality:  Large (67-100%) None Present (0%) None Present (0%) Necrotic A mount: Fat Layer (Subcutaneous Tissue): Yes Fat Layer (Subcutaneous Tissue): Yes Fat Layer (Subcutaneous Tissue): Yes Exposed Structures: Fascia: No Fascia: No Fascia: No Tendon: No Tendon: No Tendon: No Muscle: No Muscle: No Muscle: No Joint: No Joint: No Joint: No Bone: No Bone: No Bone: No Small (1-33%) None Small (1-33%) Epithelialization: Chemical/Enzymatic/Mechanical Debridement - Excisional N/A Debridement: Pre-procedure Verification/Time Out N/A 11:00 N/A Taken: N/A Lidocaine 5% topical ointment N/A Pain Control: N/A Callus, Subcutaneous, Slough N/A Tissue Debrided: N/A Skin/Subcutaneous Tissue N/A Level: N/A 2 N/A Debridement A (sq cm): rea N/A Curette N/A Instrument: None Minimum N/A Bleeding: N/A Pressure N/A Hemostasis Achieved: N/A 0 N/A Procedural Pain: N/A 0 N/A Post Procedural Pain: Procedure was tolerated well Procedure was tolerated well N/A Debridement Treatment Response: 6.5x4x0.5 0.4x0.7x0.2 N/A Post  Debridement Measurements L x W x D (cm) 10.21 0.044 N/A Post Debridement Volume: (cm) N/A callous periwound. N/A Assessment Notes: Debridement Debridement N/A Procedures Performed: Treatment Notes Electronic Signature(s) Signed: 07/16/2022 1:45:58 PM By: Darren Shan DO Entered By: Darren Shan on 07/16/2022 11:49:20 -------------------------------------------------------------------------------- Multi-Disciplinary Care Plan Details Patient Name: Date of Service: [name] A. 07/16/2022 9:45 A M Medical Record Number: [id number] Patient Account Number: [id number] Date of Birth/Sex: Treating RN: July 22, 1951 (57 y.[name] Primary Care Kimbria Camposano: [name] Other Clinician: Referring Oakes Mccready: Treating Linday Rhodes/Extender: [name], [name] in Treatment: 0 Active Inactive Nutrition Nursing Diagnoses: Potential for alteratiion in Nutrition/Potential for imbalanced nutrition Goals: Patient/caregiver agrees to and verbalizes understanding of need to use nutritional supplements and/or vitamins as prescribed Date Initiated: 07/16/2022 Target Resolution Date: 08/14/2022 Goal Status: Active Patient/caregiver will maintain therapeutic glucose control Date Initiated: 07/16/2022 Target Resolution Date: 08/14/2022 Goal Status: Active Interventions: Assess HgA1c results as ordered upon admission and as needed Provide education on nutrition Treatment Activities: Obtain HgA1c : 07/16/2022 Patient referred to Primary Care Physician for further nutritional evaluation : 07/16/2022 Notes: Orientation to the Wound Care Program Nursing Diagnoses: Knowledge deficit related to the wound healing [name] program Goals: Patient/caregiver will verbalize understanding of the [place] Date Initiated: 07/16/2022 Target Resolution Date: 08/14/2022 Goal Status: Active Interventions: Provide education on orientation to the wound  [name] Notes: Pain, Acute or Chronic Nursing Diagnoses: Pain, acute or chronic: actual or potential Potential alteration in comfort, pain Goals: Patient will verbalize adequate pain control and receive pain control interventions during procedures as needed Date Initiated: 07/16/2022 Target Resolution Date: 08/12/2022 Goal Status: Active Interventions: Encourage patient to take pain medications as prescribed Provide education on pain management Reposition patient for comfort Treatment Activities: Administer pain control measures as ordered : 07/16/2022 Notes: Wound/Skin Impairment Nursing Diagnoses: Knowledge deficit related to ulceration/compromised skin integrity Goals: Patient/caregiver will verbalize understanding of skin care regimen Date Initiated: 07/16/2022 Target Resolution Date: 08/13/2022 Goal Status: Active Interventions: Assess patient/caregiver ability to perform ulcer/skin care regimen upon admission and as needed Assess ulceration(s) every visit Provide education on ulcer and skin care Treatment Activities: Skin care regimen initiated : 07/16/2022 Topical wound management initiated : 07/16/2022 Notes: Electronic Signature(s) Signed: 07/16/2022 4:40:53 PM By: Deon Pilling RN, BSN Entered By: Deon Pilling on 07/16/2022 10:41:30 -------------------------------------------------------------------------------- Pain Assessment Details Patient Name: Date of Service: [name] [name] 07/16/2022 9:45 A M Medical Record Number: [id number] Patient Account Number: [id number] Date of Birth/Sex: Treating RN: 06-27-1951 (77 y.[name] Primary Care Luisangel Wainright: [name] Other Clinician: Referring Rose Hegner: Treating Nnaemeka Samson/Extender: Darren Shan  [name], [name] in Treatment: 0 Active Problems Location of Pain Severity and Description of Pain Patient Has Paino No Site Locations Rate the pain. Rate the pain. Current Pain Level: 0 Pain Management  and Medication Current Pain Management: Medication: No Cold Application: No Rest: No Massage: No Activity: No T.E.N.S.: No Heat Application: No Leg drop or elevation: No Is the Current Pain Management Adequate: Adequate How does your wound impact your activities of daily livingo Sleep: No Bathing: No Appetite: No Relationship With Others: No Bladder Continence: No Emotions: No Bowel Continence: No Work: No Toileting: No Drive: No Dressing: No Hobbies: No Engineer, maintenance) Signed: 07/16/2022 4:40:53 PM By: Deon Pilling RN, BSN Entered By: Deon Pilling on 07/16/2022 10:15:45 -------------------------------------------------------------------------------- Patient/Caregiver Education Details Patient Name: Date of Service: [name] A. 9/7/2023andnbsp9:45 A M Medical Record Number: [id number] Patient Account Number: [id number] Date of Birth/Gender: Treating RN: June 01, 1951 (9 y.[name] Primary Care Physician: [name] Other Clinician: Referring Physician: Treating Physician/Extender: [name] in Treatment: 0 Education Assessment Education Provided To: Patient and Caregiver Education Topics Provided [place]: Darren Handouts: Welcome T The [place] Darren Methods: Explain/Verbal Responses: Reinforcements needed Electronic Signature(s) Signed: 07/16/2022 4:40:53 PM By: Deon Pilling RN, BSN Entered By: Deon Pilling on 07/16/2022 10:41:39 -------------------------------------------------------------------------------- Wound Assessment Details Patient Name: Date of Service: [name] [facility] A. 07/16/2022 9:45 A M Medical Record Number: [id number] Patient Account Number: [id number] Date of Birth/Sex: Treating RN: 16-Aug-1951 (67 y.[name] Primary Care Dulcy Sida: [name] Other Clinician: Referring Roberta Angell: Treating Aashika Carta/Extender: [name],  [name] in Treatment: 0 Wound Status Wound Number: 1 Primary Diabetic Wound/Ulcer of the Lower Extremity Etiology: Wound Location: Right Achilles Wound Open Wounding Event: Surgical Injury Status: Date Acquired: 07/16/2022 Comorbid Sleep Apnea, Arrhythmia, Congestive Heart Failure, Hypertension, [name] Of Treatment: 0 History: Peripheral Arterial Disease, Type II Diabetes Clustered Wound: No Photos Wound Measurements Length: (cm) 6.5 Width: (cm) 4 Depth: (cm) 0.5 Area: (cm) 20.42 Volume: (cm) 10.21 % Reduction in Area: 0% % Reduction in Volume: 0% Epithelialization: Small (1-33%) Tunneling: No Undermining: No Wound Description Classification: Grade 2 Wound Margin: Distinct, outline attached Exudate Amount: Medium Exudate Type: Serosanguineous Exudate Color: red, brown Foul Odor After Cleansing: Yes Due to Product Use: No Slough/Fibrino Yes Wound Bed Granulation Amount: Small (1-33%) Exposed Structure Granulation Quality: Red, Pink Fascia Exposed: No Necrotic Amount: Large (67-100%) Fat Layer (Subcutaneous Tissue) Exposed: Yes Necrotic Quality: Adherent Slough Tendon Exposed: No Muscle Exposed: No Joint Exposed: No Bone Exposed: No Treatment Notes Wound #1 (Achilles) Wound Laterality: Right Cleanser Wound Cleanser Discharge Instruction: Cleanse the wound with wound cleanser prior to applying a clean dressing using gauze sponges, not tissue or cotton balls. Peri-Wound Care Topical Primary Dressing Dakin's Solution 0.25%, 16 (oz) Discharge Instruction: Moisten gauze with Dakin's solution Secondary Dressing ABD Pad, 5x9 Discharge Instruction: Apply over primary dressing as directed. Woven Gauze Sponge, Non-Sterile 4x4 in Discharge Instruction: Apply over primary dressing as directed. Secured With [employer], 4.5x3.1 (in/yd) Discharge Instruction: Secure with Kerlix as directed. 26M Medipore H Soft Cloth Surgical T ape, 4 x 10 (in/yd) Discharge  Instruction: Secure with tape as directed. Compression Wrap Compression Stockings Add-Ons Electronic Signature(s) Signed: 07/16/2022 4:40:53 PM By: Deon Pilling RN, BSN Signed: 07/17/2022 12:32:37 PM By: Erenest Blank Entered By: Erenest Blank on 07/16/2022 10:29:18 -------------------------------------------------------------------------------- Wound Assessment Details Patient Name: Date of Service: Darren DRO [name] [name]  07/16/2022 9:45 A M Medical Record Number: [id number] Patient Account Number: [id number] Date of Birth/Sex: Treating RN: 1950/12/01 (75 y.[name] Primary Care Isacc Turney: [name] Other Clinician: Referring Earsel Shouse: Treating Marcelles Clinard/Extender: [name], [name] in Treatment: 0 Wound Status Wound Number: 2 Primary Diabetic Wound/Ulcer of the Lower Extremity Etiology: Wound Location: Left, Plantar Foot Wound Open Wounding Event: Gradually Appeared Status: Date Acquired: 03/09/2022 Comorbid Sleep Apnea, Arrhythmia, Congestive Heart Failure, Hypertension, [name] Of Treatment: 0 History: Peripheral Arterial Disease, Type II Diabetes Clustered Wound: No Photos Wound Measurements Length: (cm) 0.4 Width: (cm) 0.7 Depth: (cm) 0.3 Area: (cm) 0.22 Volume: (cm) 0.066 Wound Description Classification: Grade 2 Wound Margin: Distinct, outline attached Exudate Amount: Medium Exudate Type: Serosanguineous Exudate Color: red, brown Foul Odor After Cleansing: Slough/Fibrino % Reduction in Area: 0% % Reduction in Volume: 0% Epithelialization: None Tunneling: No Undermining: Yes Starting Position (Darren'clock): 12 Ending Position (Darren'clock): 12 Maximum Distance: (cm) 1 No No Wound Bed Granulation Amount: Large (67-100%) Exposed Structure Granulation Quality: Pink, Pale Fascia Exposed: No Necrotic Amount: None Present (0%) Fat Layer (Subcutaneous Tissue) Exposed: Yes Tendon Exposed: No Muscle Exposed: No Joint Exposed:  No Bone Exposed: No Assessment Notes callous periwound. Treatment Notes Wound #2 (Foot) Wound Laterality: Plantar, Left Cleanser Soap and Water Discharge Instruction: May shower and wash wound with dial antibacterial soap and water prior to dressing change. Wound Cleanser Discharge Instruction: Cleanse the wound with wound cleanser prior to applying a clean dressing using gauze sponges, not tissue or cotton balls. Peri-Wound Care Skin Prep Discharge Instruction: Use skin prep as directed Topical Primary Dressing MediHoney Gel, tube 1.5 (oz) Discharge Instruction: Apply to wound bed as instructed Secondary Dressing Optifoam Non-Adhesive Dressing, 4x4 in Discharge Instruction: Apply over primary dressing as directed. Woven Gauze Sponges 2x2 in Discharge Instruction: Apply over primary dressing as directed. Secured With [employer], 4.5x3.1 (in/yd) Discharge Instruction: Secure with Kerlix as directed. 71M Medipore H Soft Cloth Surgical T ape, 4 x 10 (in/yd) Discharge Instruction: Secure with tape as directed. Compression Wrap Compression Stockings Add-Ons Electronic Signature(s) Signed: 07/16/2022 4:40:53 PM By: Deon Pilling RN, BSN Signed: 07/17/2022 12:32:37 PM By: Erenest Blank Entered By: Erenest Blank on 07/16/2022 10:30:15 -------------------------------------------------------------------------------- Wound Assessment Details Patient Name: Date of Service: [name] A. 07/16/2022 9:45 A M Medical Record Number: [id number] Patient Account Number: [id number] Date of Birth/Sex: Treating RN: Sep 17, 1951 (57 y.[name] Primary Care Vinnie Gombert: Other Clinician: [name] Referring Zhane Donlan: Treating Kenidee Cregan/Extender: [name], [name] in Treatment: 0 Wound Status Wound Number: 3 Primary Diabetic Wound/Ulcer of the Lower Extremity Etiology: Wound Location: Left T Fourth oe Wound Open Wounding Event: Gradually  Appeared Status: Date Acquired: 07/13/2022 Comorbid Sleep Apnea, Arrhythmia, Congestive Heart Failure, Hypertension, [name] Of Treatment: 0 History: Peripheral Arterial Disease, Type II Diabetes Clustered Wound: No Photos Wound Measurements Length: (cm) 0.4 Width: (cm) 0.5 Depth: (cm) 0.1 Area: (cm) 0.157 Volume: (cm) 0.016 % Reduction in Area: 0% % Reduction in Volume: 0% Epithelialization: Small (1-33%) Tunneling: No Undermining: No Wound Description Classification: Grade 1 Wound Margin: Distinct, outline attached Exudate Amount: Medium Exudate Type: Serosanguineous Exudate Color: red, brown Foul Odor After Cleansing: No Slough/Fibrino No Wound Bed Granulation Amount: Large (67-100%) Exposed Structure Granulation Quality: Red Fascia Exposed: No Necrotic Amount: None Present (0%) Fat Layer (Subcutaneous Tissue) Exposed: Yes Tendon Exposed: No Muscle Exposed: No Joint Exposed: No Bone Exposed: No Treatment Notes Wound #3 (Toe Fourth) Wound Laterality: Left Cleanser Soap and  Water Discharge Instruction: May shower and wash wound with dial antibacterial soap and water prior to dressing change. Wound Cleanser Discharge Instruction: Cleanse the wound with wound cleanser prior to applying a clean dressing using gauze sponges, not tissue or cotton balls. Peri-Wound Care Topical bactracin antibiotic ointment Discharge Instruction: apply daily. Primary Dressing Secondary Dressing bandaid Discharge Instruction: apply over the antibiotic ointment. Secured With Compression Wrap Compression Stockings Facilities manager) Signed: 07/16/2022 4:40:53 PM By: Darren Stall RN, BSN Signed: 07/17/2022 12:32:37 PM By: Thayer Dallas Entered By: Thayer Dallas on 07/16/2022 10:30:52 -------------------------------------------------------------------------------- Vitals Details Patient Name: Date of Service: [name] 07/16/2022 9:45 A M Medical Record  Number: [id number] Patient Account Number: [id number] Date of Birth/Sex: Treating RN: Apr 06, 1951 (11 y.Darren. [name] Primary Care Cordaryl Decelles: [name] Other Clinician: Referring Brendia Dampier: Treating Azarya Oconnell/Extender: [name], [name] in Treatment: 0 Vital Signs Time Taken: 10:05 Temperature (F): 98.1 Height (in): 68 Pulse (bpm): 106 Source: Stated Respiratory Rate (breaths/min): 20 Weight (lbs): 185 Blood Pressure (mmHg): 166/91 Source: Stated Reference Range: 80 - 120 mg / dl Body Mass Index (BMI): 28.1 Electronic Signature(s) Signed: 07/16/2022 4:40:53 PM By: Darren Stall RN, BSN Entered By: [name] on 07/16/2022 10:11:00

## 2022-07-20 ENCOUNTER — Ambulatory Visit (INDEPENDENT_AMBULATORY_CARE_PROVIDER_SITE_OTHER): Payer: Medicare HMO | Admitting: Podiatry

## 2022-07-20 DIAGNOSIS — Z91199 Patient's noncompliance with other medical treatment and regimen due to unspecified reason: Secondary | ICD-10-CM

## 2022-07-20 NOTE — Progress Notes (Signed)
No show

## 2022-07-21 ENCOUNTER — Ambulatory Visit (HOSPITAL_COMMUNITY)
Admission: RE | Admit: 2022-07-21 | Discharge: 2022-07-21 | Disposition: A | Discharge status: Home / Self Care | Payer: Medicare HMO | Source: Ambulatory Visit | Attending: Vascular Surgery | Admitting: Vascular Surgery

## 2022-07-21 ENCOUNTER — Ambulatory Visit (INDEPENDENT_AMBULATORY_CARE_PROVIDER_SITE_OTHER)
Admission: RE | Admit: 2022-07-21 | Discharge: 2022-07-21 | Disposition: A | Discharge status: Home / Self Care | Payer: Medicare HMO | Source: Ambulatory Visit | Attending: Vascular Surgery | Admitting: Vascular Surgery

## 2022-07-21 ENCOUNTER — Other Ambulatory Visit (HOSPITAL_COMMUNITY): Payer: Self-pay | Admitting: Vascular Surgery

## 2022-07-21 ENCOUNTER — Ambulatory Visit (INDEPENDENT_AMBULATORY_CARE_PROVIDER_SITE_OTHER): Payer: Medicare HMO | Admitting: Vascular Surgery

## 2022-07-21 ENCOUNTER — Encounter: Payer: Self-pay | Admitting: Vascular Surgery

## 2022-07-21 DIAGNOSIS — I70223 Atherosclerosis of native arteries of extremities with rest pain, bilateral legs: Secondary | ICD-10-CM | POA: Diagnosis not present

## 2022-07-21 DIAGNOSIS — I739 Peripheral vascular disease, unspecified: Secondary | ICD-10-CM | POA: Insufficient documentation

## 2022-07-21 DIAGNOSIS — I70221 Atherosclerosis of native arteries of extremities with rest pain, right leg: Secondary | ICD-10-CM | POA: Insufficient documentation

## 2022-07-21 NOTE — Progress Notes (Signed)
Patient name: [name] MRN: [id number] DOB: July 07, 1951 Sex: male  REASON FOR CONSULT: 1 month follow-up after right leg intervention for CLI with tissue loss  HPI: [name] is a 71 y.o. male, with history of atrial fibrillation, congestive heart failure, CKD, diabetes, hypertension, PVD that presents for 1 month follow-up.  He recent underwent angiogram on 06/11/22 with right PT angioplasty requiring retrograde pedal access for CLI with tissue loss.  His wound is followed by podiatry by Dr. Blenda Mounts.  He no showed his appointment yesterday.  Has an appointment with wound care on Thursday after referral from podiatry.  He has previously undergone left peroneal angioplasty on 05/19/2021 for left fifth toe gangrene.  Past Medical History:  Diagnosis Date   A-fib [facility])    a. (06/04/14) TEE-DC-CV; succesful; large LA 6.2 cm   ADHD    Ascites    Athlete's foot    Chronic systolic heart failure ([place])    a. 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, unspecified CKD stage, unspecified whether long term insulin use (HCC)    Heart murmur    HTN (hypertension)    OSA (obstructive sleep apnea)    PVD (peripheral vascular disease) (HCC)    s/p great toe amputation    Past Surgical History:  Procedure Laterality Date   ABDOMINAL AORTOGRAM W/LOWER EXTREMITY N/A 05/19/2021   Procedure: ABDOMINAL AORTOGRAM W/LOWER EXTREMITY;  Surgeon: Marty Heck, MD;  Location: [place] [facility];  Service: Cardiovascular;  Laterality: N/A;   ABDOMINAL AORTOGRAM W/LOWER EXTREMITY Right 06/11/2022   Procedure: ABDOMINAL AORTOGRAM W/LOWER EXTREMITY;  Surgeon: Marty Heck, MD;  Location: [place] [facility];  Service: Cardiovascular;  Laterality: Right;   AMPUTATION Left 05/22/2021    Procedure: LEFT FIFTH TOE AMPUTATION;  Surgeon: Marty Heck, MD;  Location: [place];  Service: Vascular;  Laterality: Left;   CARDIOVERSION N/A 06/04/2014   Procedure: CARDIOVERSION;  Surgeon: Larey Dresser, MD;  Location: [facility] ENDOSCOPY;  Service: Cardiovascular;  Laterality: N/A;   CARDIOVERSION N/A 11/06/2021   Procedure: CARDIOVERSION;  Surgeon: Larey Dresser, MD;  Location: [facility] ENDOSCOPY;  Service: Cardiovascular;  Laterality: N/A;   NOSE SURGERY     Nasal septum surgery   PERIPHERAL VASCULAR BALLOON ANGIOPLASTY  06/11/2022   Procedure: PERIPHERAL VASCULAR BALLOON ANGIOPLASTY;  Surgeon: Marty Heck, MD;  Location: [place] [facility];  Service: Cardiovascular;;   PERIPHERAL VASCULAR INTERVENTION Left 05/19/2021   Procedure: PERIPHERAL VASCULAR INTERVENTION;  Surgeon: Marty Heck, MD;  Location: [place] [facility];  Service: Cardiovascular;  Laterality: Left;   TEE WITHOUT CARDIOVERSION N/A 06/04/2014   Procedure: TRANSESOPHAGEAL ECHOCARDIOGRAM (TEE);  Surgeon: Larey Dresser, MD;  Location: [place];  Service: Cardiovascular;  Laterality: N/A;   TEE WITHOUT CARDIOVERSION N/A 01/06/2016   Procedure: TRANSESOPHAGEAL ECHOCARDIOGRAM (TEE);  Surgeon: Fay Records, MD;  Location: [place];  Service: Cardiovascular;  Laterality: N/A;   TEE WITHOUT CARDIOVERSION N/A 11/06/2021   Procedure: TRANSESOPHAGEAL ECHOCARDIOGRAM (TEE);  Surgeon: Larey Dresser, MD;  Location: [facility] ENDOSCOPY;  Service: Cardiovascular;  Laterality: N/A;   VASECTOMY      Family History  Problem Relation Age of Onset   Heart attack Mother        deceased   Diabetes  Mother    Heart attack Sister     SOCIAL HISTORY: Social History   Socioeconomic History   Marital status: Single    Spouse name: Not on file   Number of children: Not on file   Years of education: Not on file   Highest education level: Not on file  Occupational History   Occupation: retired  Tobacco Use   Smoking  status: Some Days    Years: 52.00    Types: Cigarettes, Cigars    Last attempt to quit: 05/2021    Years since quitting: 1.2    Passive exposure: Never   Smokeless tobacco: Never  Vaping Use   Vaping Use: Never used  Substance and Sexual Activity   Alcohol use: Not Currently   Drug use: Never   Sexual activity: Not on file  Other Topics Concern   Not on file  Social History Narrative   ** Merged History Encounter ** Lives in [place] by himself. Retired from [employer] and [employer] for [employer].       Tobacco use, amount per day now:   Past tobacco use, amount per day:   How many years did you use tobacco:   Alcohol use (drinks per week): N/A   Diet:   Do you drink/eat things with caffeine:   Marital status:  Divorced                                What year were you married? 2003   Do you live in a house, apartment, assisted living, condo, trailer, etc.? House   Is it one or more stories? One   How many persons live in your home?   Do you have pets in your home?( please list) N/A   Highest Level of education completed? Bachelors Degree   Current or past profession: Teacher-Special Ed   Do you exercise?  Yes                                Type and how often? Daily squats, and push ups.   Do you have a living will? No   Do you have a DNR form?  No                                 If not, do you want to discuss one?   Do you have signed POA/HPOA forms?  No                      If so, please bring to you appointment      Do you have any difficulty bathing or dressing yourself? Bathing only if pants not shirts/not shorts.   Do you have any difficulty preparing food or eating? No   Do you have any difficulty managing your medications? No   Do you have any difficulty managing your finances? No   Do you have any difficulty affording your medications? No   Social Determinants of Health   Financial Resource Strain: Not on file  Food Insecurity: No Food Insecurity  (11/10/2021)   Hunger Vital Sign    Worried About Running Out of Food in the Last Year: Never true    Ran Out of Food in the Last Year: Never true  Transportation Needs: No Transportation Needs (  11/10/2021)   PRAPARE - Hydrologist (Medical): No    Lack of Transportation (Non-Medical): No  Physical Activity: Not on file  Stress: Not on file  Social Connections: Not on file  Intimate Partner Violence: Not on file    No Known Allergies  Current Outpatient Medications  Medication Sig Dispense Refill   APPLE CIDER VINEGAR PO Take 30 mLs by mouth 4 (four) times a week.     blood glucose meter kit and supplies Dispense based on patient and insurance preference. Use up to four times daily as directed. (FOR ICD-10 E10.9, E11.9). 1 each 0   clopidogrel (PLAVIX) 75 MG tablet Take 1 tablet (75 mg total) by mouth daily. 30 tablet 11   diclofenac Sodium (VOLTAREN) 1 % GEL Apply 1 Application topically 4 (four) times daily as needed (pain).     empagliflozin (JARDIANCE) 10 MG TABS tablet TAKE 1 TABLET(10 MG) BY MOUTH DAILY 30 tablet 5   ENTRESTO 49-51 MG TAKE 1 TABLET BY MOUTH TWICE DAILY 180 tablet 2   furosemide (LASIX) 20 MG tablet Take 1 tablet by mouth 2 (two) times daily. May take an additional tablet daily if needed. 90 tablet 0   insulin glargine (LANTUS) 100 UNIT/ML injection Inject 0.08 mLs (8 Units total) into the skin daily. 10 mL 1   Insulin Syringe-Needle U-100 (INSULIN SYRINGE 1CC/30GX5/16") 30G X 5/16" 1 ML MISC Use once daily for insulin injections. Dx:E11.22 100 each 1   KRILL OIL OMEGA-3 PO Take 1 tablet by mouth daily.     methocarbamol (ROBAXIN) 500 MG tablet Take 1 tablet (500 mg total) by mouth every 8 (eight) hours as needed for muscle spasms. 50 tablet 1   metoprolol succinate (TOPROL XL) 25 MG 24 hr tablet Take 1 tablet (25 mg total) by mouth daily. 30 tablet 11   Misc Natural Products (JOINT SUPPORT PO) Take 1 Dose by mouth 3 (three) times a week.      Povidone-Iodine (BETADINE EX) Apply 1 Application topically daily.     Sodium Hyaluronate, oral, (HYALURONIC ACID PO) Take 2 capsules by mouth daily.     spironolactone (ALDACTONE) 25 MG tablet Take 0.5 tablets (12.5 mg total) by mouth daily. 45 tablet 3   traMADol (ULTRAM) 50 MG tablet Take 1 tablet (50 mg total) by mouth every 6 (six) hours as needed for moderate pain. 20 tablet 0   warfarin (COUMADIN) 6 MG tablet Take 0.5-1 tablets (3-6 mg total) by mouth See admin instructions. Take 6 mg on Mon and Fri Take 3 mg on Sun, Tues, Wed, Thurs, and Sat 30 tablet 6   Wound Cleansers (WOUND Kulm EX) Apply 1 Application topically daily.     No current facility-administered medications for this visit.    REVIEW OF SYSTEMS:  [X] denotes positive finding, [ ] denotes negative finding Cardiac  Comments:  Chest pain or chest pressure:    Shortness of breath upon exertion:    Short of breath when lying flat:    Irregular heart rhythm:        Vascular    Pain in calf, thigh, or hip brought on by ambulation:    Pain in feet at night that wakes you up from your sleep:     Blood clot in your veins:    Leg swelling:         Pulmonary    Oxygen at home:    Productive cough:     Wheezing:  Neurologic    Sudden weakness in arms or legs:     Sudden numbness in arms or legs:     Sudden onset of difficulty speaking or slurred speech:    Temporary loss of vision in one eye:     Problems with dizziness:         Gastrointestinal    Blood in stool:     Vomited blood:         Genitourinary    Burning when urinating:     Blood in urine:        Psychiatric    Major depression:         Hematologic    Bleeding problems:    Problems with blood clotting too easily:        Skin    Rashes or ulcers:        Constitutional    Fever or chills:      PHYSICAL EXAM: Vitals:   07/21/22 1214  BP: (!) 171/104  Pulse: (!) 105  Resp: 18  Temp: 98 F (36.7 C)  TempSrc: Temporal  SpO2:  94%  Weight: 204 lb (92.5 kg)  Height: 5' 8" (1.727 m)    GENERAL: The patient is a well-nourished male, in no acute distress. The vital signs are documented above. CARDIAC: There is a regular rate and rhythm.  VASCULAR:  PT signal brisk right foot PT signal brisk left foot Tissue loss as pictured below PULMONARY: No respiratory distress. ABDOMEN: Soft and non-tender . MUSCULOSKELETAL: There are no major deformities or cyanosis. NEUROLOGIC: No focal weakness or paresthesias are detected.        DATA:   Right leg arterial duplex today shows the recent right PT intervention patent  Pulsatile toe tracings bilaterally with toe pressure 73 right and 68 left  Assessment/Plan:  71 year old male underwent right lower extremity arteriogram on 06/11/22 requiring retrograde right PT angioplasty that presents for follow-up of his CLI with tissue loss.  He has a brisk PT signal on exam at the right ankle.  The PT intervention remains patent by duplex.  He has pulsatile toe tracing that appear adequate for wound healing.  He is optimized from a vascular surgery standpoint.  He is followed by podiatry for his wounds.  Going to the wound clinic on Thursday.  I will see him in 1 month for wound check.  Remains high risk for limb loss as I discussed with him and his daughter today.   Marty Heck, MD [facility] Office: [phone]

## 2022-07-22 ENCOUNTER — Ambulatory Visit: Payer: Medicare HMO | Attending: Cardiovascular Disease

## 2022-07-22 DIAGNOSIS — I4891 Unspecified atrial fibrillation: Secondary | ICD-10-CM

## 2022-07-22 DIAGNOSIS — Z5181 Encounter for therapeutic drug level monitoring: Secondary | ICD-10-CM

## 2022-07-22 LAB — POCT INR: INR: 1.5 — AB (ref 2.0–3.0)

## 2022-07-22 NOTE — Patient Instructions (Signed)
Description   Take an extra tablet today and 1 tablet tomorrow tomorrow and then continue 1/2 tablet daily except for 1 tablet on Mondays and Fridays. Recheck INR 3 weeks.  Coumadin Clinic [phone].  Pt stopped taking amio on 3/7

## 2022-07-23 ENCOUNTER — Other Ambulatory Visit: Payer: Self-pay | Admitting: Family

## 2022-07-23 ENCOUNTER — Encounter (HOSPITAL_BASED_OUTPATIENT_CLINIC_OR_DEPARTMENT_OTHER): Payer: Medicare HMO | Admitting: General Surgery

## 2022-07-23 DIAGNOSIS — L97518 Non-pressure chronic ulcer of other part of right foot with other specified severity: Secondary | ICD-10-CM | POA: Diagnosis not present

## 2022-07-23 DIAGNOSIS — E11622 Type 2 diabetes mellitus with other skin ulcer: Secondary | ICD-10-CM | POA: Diagnosis not present

## 2022-07-23 DIAGNOSIS — I130 Hypertensive heart and chronic kidney disease with heart failure and stage 1 through stage 4 chronic kidney disease, or unspecified chronic kidney disease: Secondary | ICD-10-CM | POA: Diagnosis not present

## 2022-07-23 DIAGNOSIS — E1151 Type 2 diabetes mellitus with diabetic peripheral angiopathy without gangrene: Secondary | ICD-10-CM | POA: Diagnosis not present

## 2022-07-23 DIAGNOSIS — E1122 Type 2 diabetes mellitus with diabetic chronic kidney disease: Secondary | ICD-10-CM | POA: Diagnosis not present

## 2022-07-23 DIAGNOSIS — L97313 Non-pressure chronic ulcer of right ankle with necrosis of muscle: Secondary | ICD-10-CM | POA: Diagnosis not present

## 2022-07-23 DIAGNOSIS — E11621 Type 2 diabetes mellitus with foot ulcer: Secondary | ICD-10-CM | POA: Diagnosis not present

## 2022-07-23 DIAGNOSIS — E1142 Type 2 diabetes mellitus with diabetic polyneuropathy: Secondary | ICD-10-CM | POA: Diagnosis not present

## 2022-07-23 DIAGNOSIS — G4733 Obstructive sleep apnea (adult) (pediatric): Secondary | ICD-10-CM | POA: Diagnosis not present

## 2022-07-23 DIAGNOSIS — N183 Chronic kidney disease, stage 3 unspecified: Secondary | ICD-10-CM | POA: Diagnosis not present

## 2022-07-23 DIAGNOSIS — L97522 Non-pressure chronic ulcer of other part of left foot with fat layer exposed: Secondary | ICD-10-CM | POA: Diagnosis not present

## 2022-07-24 ENCOUNTER — Other Ambulatory Visit: Payer: Self-pay | Admitting: Family

## 2022-07-24 DIAGNOSIS — E1122 Type 2 diabetes mellitus with diabetic chronic kidney disease: Secondary | ICD-10-CM

## 2022-07-24 NOTE — Progress Notes (Signed)
[name], [name] ([id number]) Visit Report for 07/23/2022 Chief Complaint Document Details Patient Name: Date of Service: [name] [name] A. 07/23/2022 10:45 A M Medical Record Number: [id number] Patient Account Number: [id number] Date of Birth/Sex: Treating RN: 07-Dec-1950 (71 y.o. M) Primary Care Provider: Richarda [name] Other Clinician: Referring Provider: Treating Provider/Extender: [name], [name] in Treatment: 1 Information Obtained from: Patient Chief Complaint 07/16/2022; bilateral lower extremity wounds Electronic Signature(s) Signed: 07/23/2022 12:31:17 PM By: [name] Previous Signature: 07/23/2022 12:29:09 PM Version By: [name] Entered By: [name] on 07/23/2022 12:31:16 -------------------------------------------------------------------------------- Debridement Details Patient Name: Date of Service: [name] Darren A. 07/23/2022 10:45 A M Medical Record Number: [id number] Patient Account Number: [id number] Date of Birth/Sex: Treating RN: June 20, 1951 (72 y.o. [name] Primary Care Provider: Richarda [name] Other Clinician: Referring Provider: Treating Provider/Extender: [name], [name] in Treatment: 1 Debridement Performed for Assessment: Wound #1 Right Achilles Performed By: Physician Darren Guess, MD Debridement Type: Debridement Severity of Tissue Pre Debridement: Fat layer exposed Level of Consciousness (Pre-procedure): Awake and Alert Pre-procedure Verification/Time Out Yes - 11:50 Taken: Start Time: 11:51 T Area Debrided (L x W): otal 7 (cm) x 4.5 (cm) = 31.5 (cm) Tissue and other material debrided: Eschar, Muscle, Slough, Subcutaneous, Tendon, Slough, Other: Maggots Level: Skin/Subcutaneous Tissue/Muscle Debridement Description: Excisional Instrument: [name], Curette, Forceps, Scissors Bleeding: Moderate Hemostasis Achieved: Pressure Procedural Pain:  0 Post Procedural Pain: 0 Response to Treatment: Procedure was tolerated well Level of Consciousness (Post- Awake and Alert procedure): Post Debridement Measurements of Total Wound Length: (cm) 7 Width: (cm) 4.5 Depth: (cm) 0.5 Volume: (cm) 12.37 Character of Wound/Ulcer Post Debridement: Requires Further Debridement Severity of Tissue Post Debridement: Necrosis of muscle Post Procedure Diagnosis Same as Pre-procedure Notes Scribed for [name] by Darren Ard, RN Electronic Signature(s) Signed: 07/23/2022 12:58:04 PM By: [name] Signed: 07/24/2022 5:03:05 PM By: Darren Ard RN Previous Signature: 07/23/2022 12:31:10 PM Version By: [name] Entered By: [name] on 07/23/2022 12:48:03 -------------------------------------------------------------------------------- HPI Details Patient Name: Date of Service: [name] Darren A. 07/23/2022 10:45 A M Medical Record Number: [id number] Patient Account Number: [id number] Date of Birth/Sex: Treating RN: 21-Jul-1951 (71 y.o. M) Primary Care Provider: Richarda [name] Other Clinician: Referring Provider: Treating Provider/Extender: [name], [name] in Treatment: 1 History of Present Illness HPI Description: Admission 07/16/2022 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 He is not sure how the wounds started. He has been following with podiatry for this issue. He had removal of the tendon to the right lower extremity by Dr. Ralene [name]. He has been using Betadine to the wound beds. On 06/11/2022 he had a right posterior  tibial artery angioplasty by Dr. Chestine [name]. It was reported the patient is optimized after revascularization of the right lower extremity. His ABIs on the left were 0.91. He currently denies systemic signs of infection. He also does not wear shoes. He came in with Kerlix wrap to his feet bilaterally. This did not completely cover his feet. 07/23/2022: This is a patient of [name] that she asked me to take  a look at last week due to the significant involvement of the muscle and tendon on his right posterior leg wound. He needed an aggressive debridement and asked me if I would be able to perform this on her behalf. The patient is here today for that procedure. When his dressing was removed in clinic today, the wound was teeming with maggots. There is necrotic muscle, tendon, and fat, with a thick layer of slough on the wound. Electronic Signature(s) Signed: 07/23/2022 12:38:33 PM By: Darren Maudlin MD [name] Previous Signature: 07/23/2022 12:30:17 PM Version By: Darren Maudlin MD [name] Entered By: [name] on 07/23/2022 12:38:32 -------------------------------------------------------------------------------- Physical Exam Details Patient Name: Date of Service: Darren Nip Darren A. 07/23/2022 10:45 A M Medical Record Number: [id number] Patient Account Number: [id number] Date of Birth/Sex: Treating RN: 06/26/1951 (71 y.o. M) Primary Care Provider: Marlowe [name] Other Clinician: Referring Provider: Treating Provider/Extender: [name], [name] in Treatment: 1 Constitutional Hypertensive, asymptomatic. Slightly tachycardic, asymptomatic. . . No acute distress.Marland Kitchen Respiratory Normal work of breathing on room air.. Notes 07/23/2022: When his dressing was removed in clinic today, the posterior ankle wound wound was teeming with maggots. There is necrotic muscle, tendon, and fat, with a thick layer of slough on the wound. Electronic Signature(s) Signed: 07/23/2022  12:48:45 PM By: Darren Maudlin MD [name] Entered By: [name] on 07/23/2022 12:48:45 -------------------------------------------------------------------------------- Physician Orders Details Patient Name: Date of Service: Darren Nip Darren A. 07/23/2022 10:45 A M Medical Record Number: [id number] Patient Account Number: [id number] Date of Birth/Sex: Treating RN: [birth year] (71 y.o. 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 - Dr. Heber Rawson Room 8 Return A Other: - [facility]- will ship wound care supplies. [employer] has [employer]in[employer] Solutions if your pharmacy does not have. Anesthetic (In clinic) Topical Lidocaine 5% applied to wound bed Bathing/ Shower/ Hygiene May shower with protection but do not get wound dressing(s) wet. Edema Control - Lymphedema / SCD / Other Elevate legs to the level of the heart or above for 30 minutes daily and/or when sitting, a frequency of: - 3-4 times a day throughout the day. Avoid standing for long periods of time. Off-Loading Other: - minimize walking and standing to aid in offloading pressure to wound. Wound Treatment Wound #1 - Achilles Wound Laterality: Right Cleanser: Wound Cleanser Every Other Day/30 Days Discharge Instructions: Cleanse the wound with wound cleanser prior to applying a clean dressing using gauze sponges, not  tissue or cotton balls. Prim Dressing: Iodosorb Gel 10 (gm) Tube (DME) (Generic) Every Other Day/30 Days ary Discharge Instructions: Apply to wound bed as instructed Prim Dressing: Dakin's Solution 0.25%, 16 (oz) Every Other Day/30 Days ary Discharge Instructions: Moisten gauze with Dakin's solution Secondary Dressing: ABD Pad, 5x9 Every Other Day/30 Days Discharge Instructions: Apply over primary dressing as directed. Secondary Dressing: Woven Gauze Sponge, Non-Sterile 4x4 in Every Other Day/30 Days Discharge Instructions: Apply over primary dressing as directed. Secured With: [employer], 4.5x3.1 (in/yd) Every Other Day/30  Days Discharge Instructions: Secure with Kerlix as directed. Secured With: 38M Medipore H Soft Cloth Surgical Tape, 4 x 10 (in/yd) Every Other Day/30 Days Discharge Instructions: Secure with tape as directed. Wound #2 - Foot Wound Laterality: Plantar, Left Cleanser: Soap and Water 1 x Per Day/30 Days Discharge Instructions: May shower and wash wound with dial antibacterial soap and water prior to dressing change. Cleanser: Wound Cleanser 1 x Per Day/30 Days Discharge Instructions: Cleanse the wound with wound cleanser prior to applying a clean dressing using gauze sponges, not tissue or cotton balls. Peri-Wound Care: Skin Prep 1 x Per Day/30 Days Discharge Instructions: Use skin prep as directed Prim Dressing: MediHoney Gel, tube 1.5 (oz) 1 x Per Day/30 Days ary Discharge Instructions: Apply to wound bed as instructed Secondary Dressing: Optifoam Non-Adhesive Dressing, 4x4 in 1 x Per Day/30 Days Discharge Instructions: Apply over primary dressing as directed. Secondary Dressing: Woven Gauze Sponges 2x2 in 1 x Per Day/30 Days Discharge Instructions: Apply over primary dressing as directed. Secured With: [employer], 4.5x3.1 (in/yd) 1 x Per Day/30 Days Discharge Instructions: Secure with Kerlix as directed. Secured With: 38M Medipore H Soft Cloth  Surgical T ape, 4 x 10 (in/yd) 1 x Per Day/30 Days Discharge Instructions: Secure with tape as directed. Wound #3 - T Fourth oe Wound Laterality: Left Cleanser: Soap and Water 1 x Per Day/30 Days Discharge Instructions: May shower and wash wound with dial antibacterial soap and water prior to dressing change. Cleanser: Wound Cleanser 1 x Per Day/30 Days Discharge Instructions: Cleanse the wound with wound cleanser prior to applying a clean dressing using gauze sponges, not tissue or cotton balls. Topical: bactracin antibiotic ointment 1 x Per Day/30 Days Discharge Instructions: apply daily. Secondary Dressing: bandaid 1 x Per Day/30 Days Discharge Instructions: apply over the antibiotic ointment. Electronic Signature(s) Signed: 07/23/2022 12:58:04 PM By: Darren Maudlin MD [name] Entered By: [name] on 07/23/2022 12:49:13 -------------------------------------------------------------------------------- Problem List Details Patient Name: Date of Service: Darren Nip [facility] A. 07/23/2022 10:45 A M Medical Record Number: [id number] Patient Account Number: [id number] Date of Birth/Sex: Treating RN: 15-Dec-1950 (71 y.o. M) Primary Care Provider: Marlowe [name] Other Clinician: Referring Provider: Treating Provider/Extender: [name], [name] in Treatment: 1 Active Problems ICD-10 Encounter Code Description Active Date MDM Diagnosis L97.518 Non-pressure chronic ulcer of other part of right foot with other specified 07/16/2022 No Yes severity L97.522 Non-pressure chronic ulcer of other part of left foot with fat layer exposed 07/16/2022 No Yes E11.621 Type 2 diabetes mellitus with foot ulcer 07/16/2022 No Yes I73.9 Peripheral vascular disease, unspecified 07/16/2022 No Yes 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 07/23/2022 No Yes muscle Inactive Problems Resolved Problems Electronic Signature(s) Signed: 07/23/2022  12:30:40 PM By: Darren Maudlin MD [name] Previous Signature: 07/23/2022 12:28:42 PM Version By: Darren Maudlin MD [name] Entered By: [name] on 07/23/2022 12:30:40 -------------------------------------------------------------------------------- Progress Note Details Patient Name: Date of Service: Darren Nip Darren A. 07/23/2022 10:45 A M Medical Record Number: [id number] Patient Account Number: [id number] Date of Birth/Sex: Treating RN: Aug 16, 1951 (71 y.o. M) Primary Care Provider: Marlowe [name] Other Clinician: Referring Provider: Treating Provider/Extender: [name], [name] in Treatment: 1 Subjective Chief Complaint Information obtained from Patient 07/16/2022; bilateral lower extremity wounds History of Present Illness (HPI) Admission 07/16/2022 Mr. 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 He is not sure how the wounds started. He has been following with podiatry for this issue. He had removal of the tendon to the right lower extremity by Dr. Blenda Mounts. He has been using Betadine to the wound beds. On 06/11/2022 he had a right posterior tibial artery angioplasty by Dr. Carlis Abbott. It was reported the patient is optimized after revascularization of the right lower extremity. His ABIs on the left were 0.91. He currently denies systemic signs of infection. He also does not wear shoes. He came in with Kerlix wrap to his feet bilaterally. This did not completely cover his feet. 07/23/2022: This is a patient of Dr. Jodene Nam that she asked me to take a look at last week due to the significant involvement of  the muscle and tendon on his right posterior leg wound. He needed an aggressive debridement and asked me if I would be able to perform this on her behalf. The patient is here today for that procedure. When his dressing was removed in clinic today, the wound was teeming with maggots. There is necrotic muscle, tendon, and fat, with a thick layer of slough on the wound. Patient History Information obtained from Chart. Family History Diabetes - Mother, Heart Disease - Mother,Siblings. Social History Current every day smoker, Alcohol Use - Never, Drug Use - No History, Caffeine Use - Never. Medical History Respiratory Patient has history of Sleep Apnea - does not wear Cardiovascular Patient has history of Arrhythmia - A. Fib, Congestive Heart Failure - EF 25%, Hypertension, Peripheral Arterial Disease Denies history of Peripheral Venous Disease Endocrine Patient has history of Type II Diabetes Hospitalization/Surgery History - Angiogram 06/11/2022 Dr. Carlis Abbott [facility]. - cardioversion 11/06/2021, 2015, 2017. 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 Medical A Surgical History Notes nd Gastrointestinal Ascites Genitourinary stage III Kidney disease Objective Constitutional Hypertensive, asymptomatic. Slightly tachycardic, asymptomatic. No acute distress.. Vitals Time Taken: 11:03 AM, Height: 68 in, Weight: 185 lbs, BMI: 28.1, Temperature: 97.3 F, Pulse: 105 bpm, Respiratory Rate: 20 breaths/min, Blood Pressure: 177/104 mmHg. Respiratory Normal work of breathing on room air.. General Notes: 07/23/2022: When his dressing was removed in clinic today, the posterior ankle wound wound was teeming with maggots. There is necrotic muscle, tendon, and fat, with a thick layer of slough on the wound. Integumentary (Hair, Skin) Wound #1 status is Open. Original cause of wound was Surgical Injury. The date acquired was: 07/16/2022. The wound has been in treatment 1 [name]. The wound is located on the Right  Achilles. The wound measures 7cm length x 4.5cm width x 0.5cm depth; 24.74cm^2 area and 12.37cm^3 volume. There is Fat Layer (Subcutaneous Tissue) exposed. There is a medium amount of serosanguineous drainage noted. Foul odor after cleansing was noted. The wound margin is distinct with the outline attached to the wound base. There is small (1-33%) red, pink granulation within the wound bed. There is a large (67-100%) amount of necrotic tissue within the wound bed including Adherent Slough. Wound #2 status is Open. Original cause of wound was Gradually Appeared. The date acquired was: 03/09/2022. The wound has been in treatment 1 [name]. The wound is located on the Victor. The wound measures 0.6cm length x 1cm width x 0.3cm depth; 0.471cm^2 area and 0.141cm^3 volume. There is Fat Layer (Subcutaneous Tissue) exposed.  There is no tunneling or undermining noted. There is a medium amount of serosanguineous drainage noted. The wound margin is distinct with the outline attached to the wound base. There is large (67-100%) pink, pale granulation within the wound bed. There is a small (1-33%) amount of necrotic tissue within the wound bed including Adherent Slough. Wound #3 status is Open. Original cause of wound was Gradually Appeared. The date acquired was: 07/13/2022. The wound has been in treatment 1 [name]. The wound is located on the Left T Fourth. The wound measures 0.3cm length x 0.3cm width x 0.1cm depth; 0.071cm^2 area and 0.007cm^3 volume. There is Fat oe Layer (Subcutaneous Tissue) exposed. There is no tunneling or undermining noted. There is a medium amount of serosanguineous drainage noted. The wound margin is distinct with the outline attached to the wound base. There is large (67-100%) red granulation within the wound bed. There is no necrotic tissue within the wound bed. 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 There was a Excisional Skin/Subcutaneous Tissue/Muscle Debridement with a total area of 31.5 sq cm performed by Darren Guess, MD. With the following instrument(s): [name], Curette, Forceps, and Scissors Material removed includes Muscle, Eschar, T endon, Subcutaneous Tissue, Slough, and Other: Maggots. A time out was conducted at 11:50, prior to the start of the procedure. A Moderate amount of bleeding was controlled with Pressure. The procedure was tolerated well with a pain level of 0 throughout and a pain level of 0 following the procedure. Post Debridement Measurements: 7cm length x 4.5cm width x 0.5cm depth; 12.37cm^3 volume. Character of Wound/Ulcer Post Debridement requires further debridement. Severity of Tissue Post Debridement is: Necrosis of muscle. Post procedure Diagnosis Wound #1: Same as Pre-Procedure General Notes: Scribed for [name] by Darren Ard, RN. Plan Follow-up Appointments: Return Appointment in 1 week. - Dr. Mikey Bussing Room 8 Other: - [facility]- will ship wound care supplies. [employer] has [employer] if your pharmacy does not have. Anesthetic: (In clinic) Topical Lidocaine 5% applied to wound bed Bathing/ Shower/ Hygiene: May shower with protection but do not get wound dressing(s) wet. Edema Control - Lymphedema / SCD / Other: Elevate legs to the level of the heart or above for 30 minutes daily and/or when sitting, a frequency of: - 3-4 times a day throughout the day. Avoid standing for long periods of  time. Off-Loading: Other: - minimize walking and standing to aid in offloading pressure to wound. WOUND #1: - Achilles Wound Laterality: Right Cleanser: Wound Cleanser Every Other Day/30 Days Discharge Instructions: Cleanse the wound with wound cleanser prior to applying a clean dressing using gauze sponges, not tissue or cotton balls. Prim Dressing: Iodosorb Gel 10 (gm) Tube (DME) (Generic) Every Other Day/30 Days ary Discharge Instructions: Apply to wound bed as instructed Prim Dressing: Dakin's Solution 0.25%, 16 (oz) Every Other Day/30 Days ary Discharge Instructions: Moisten gauze with Dakin's solution Secondary Dressing: ABD Pad, 5x9 Every Other Day/30 Days Discharge Instructions: Apply over primary dressing as directed. Secondary Dressing: Woven Gauze Sponge, Non-Sterile 4x4 in Every  Other Day/30 Days Discharge Instructions: Apply over primary dressing as directed. Secured With: [employer], 4.5x3.1 (in/yd) Every Other Day/30 Days Discharge Instructions: Secure with Kerlix as directed. Secured With: 35M Medipore H Soft Cloth Surgical T ape, 4 x 10 (in/yd) Every Other Day/30 Days Discharge Instructions: Secure with tape as directed. WOUND #2: - Foot Wound Laterality: Plantar, Left Cleanser: Soap and Water 1 x Per Day/30 Days Discharge Instructions: May shower and wash wound with dial antibacterial soap and water prior to dressing change. Cleanser: Wound Cleanser 1 x Per Day/30 Days Discharge Instructions: Cleanse the wound with wound cleanser prior to applying a clean dressing using gauze sponges, not tissue or cotton balls. Peri-Wound Care: Skin Prep 1 x Per Day/30 Days Discharge Instructions: Use skin prep as directed Prim Dressing: MediHoney Gel, tube 1.5 (oz) 1 x Per Day/30 Days ary Discharge Instructions: Apply to wound bed as instructed Secondary Dressing: Optifoam Non-Adhesive Dressing, 4x4 in 1 x Per Day/30 Days Discharge Instructions: Apply over primary dressing  as directed. Secondary Dressing: Woven Gauze Sponges 2x2 in 1 x Per Day/30 Days Discharge Instructions: Apply over primary dressing as directed. Secured With: [employer], 4.5x3.1 (in/yd) 1 x Per Day/30 Days Discharge Instructions: Secure with Kerlix as directed. Secured With: 35M Medipore H Soft Cloth Surgical T ape, 4 x 10 (in/yd) 1 x Per Day/30 Days Discharge Instructions: Secure with tape as directed. WOUND #3: - T Fourth Wound Laterality: Left oe Cleanser: Soap and Water 1 x Per Day/30 Days Discharge Instructions: May shower and wash wound with dial antibacterial soap and water prior to dressing change. Cleanser: Wound Cleanser 1 x Per Day/30 Days Discharge Instructions: Cleanse the wound with wound cleanser prior to applying a clean dressing using gauze sponges, not tissue or cotton balls. Topical: bactracin antibiotic ointment 1 x Per Day/30 Days Discharge Instructions: apply daily. Secondary Dressing: bandaid 1 x Per Day/30 Days Discharge Instructions: apply over the antibiotic ointment. 07/23/2022: When his dressing was removed in clinic today, the posterior ankle wound wound was teeming with maggots. There is necrotic muscle, tendon, and fat, with a thick layer of slough on the wound. I used a combination of forceps, scalpel, scissors, and curette to debride necrotic muscle, tendon, fat, slough, nonviable subcutaneous tissue, and of course the maggots from his wound. I then liberally irrigated the wound with Dakin solution. Hemostasis was achieved with pressure and quick clot. We are going to apply Iodoflex to this wound for ongoing chemical debridement. Continue other wound treatments per Dr. Jodene Nam orders. Follow-up with Dr. Heber [place]ford next week. Electronic Signature(s) Signed: 07/23/2022 12:54:19 PM By: Darren Maudlin MD [name] Entered By: [name] on 07/23/2022 12:54:19 -------------------------------------------------------------------------------- HxROS  Details Patient Name: Date of Service: Darren Nip Darren A. 07/23/2022 10:45 A M Medical Record Number: [id number] Patient Account Number: [id number] Date of Birth/Sex: Treating RN: 02/19/1951 (71 y.o. M) Primary Care Provider: Marlowe [name] Other Clinician: Referring Provider: Treating Provider/Extender: [name], [name] in Treatment: 1 Information Obtained From Chart Respiratory Medical History: Positive for: Sleep Apnea - does not wear Cardiovascular Medical History: Positive for: Arrhythmia - A. Fib; Congestive Heart Failure - EF 25%; Hypertension; Peripheral Arterial Disease Negative for: Peripheral Venous Disease Gastrointestinal Medical History: Past Medical History Notes: Ascites Endocrine Medical History: Positive for: Type II Diabetes Time with diabetes: 6 years Treated with: Insulin Blood sugar tested every day: No Genitourinary Medical History: Past Medical History Notes: stage III Kidney disease Immunizations Pneumococcal Vaccine: Received Pneumococcal Vaccination: No  Implantable Devices No devices added Hospitalization / Surgery History Type of Hospitalization/Surgery Angiogram 06/11/2022 Dr. 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 07/23/2022 Medical Record Number: [id number] Patient Account Number: [id number] Date  of Birth/Sex: Treating RN: 31-Dec-1950 (71 y.o. 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 Facility Procedures CPT4 Code: [id number] Description: [facility] - DEB MUSC/FASCIA 20 SQ CM/< ICD-10 Diagnosis Description 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 Modifier: cle Quantity: 1 CPT4 Code: [id number] Description: 11046 - DEB MUSC/FASCIA EA ADDL 20 CM ICD-10 Diagnosis Description 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 Modifier: cle Quantity: 1 Physician Procedures : CPT4 Code Description Modifier [id number] - WC PHYS LEVEL 3 - EST PT 25 ICD-10 Diagnosis Description 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

## 2022-07-24 NOTE — Progress Notes (Signed)
GEFFREY, MICHAELSEN ([id number]) Visit Report for 07/23/2022 Arrival Information Details Patient Name: Date of Service: [name] [facility] A. 07/23/2022 10:45 A M Medical Record Number: [id number] Patient Account Number: [id number] Date of Birth/Sex: Treating RN: [birth year] (71 y.o. M) Primary Care Nakyra Bourn: Richarda Blade Other Clinician: Referring Jamarii Banks: Treating Johnica Armwood/Extender: Jason Nest, [name] in Treatment: 1 Visit Information History Since Last Visit All ordered tests and consults were completed: No Patient Arrived: [name] Added or deleted any medications: No Arrival Time: 11:01 Any new allergies or adverse reactions: No Accompanied By: daughter Had a fall or experienced change in No Transfer Assistance: None activities of daily living that may affect Patient Identification Verified: Yes risk of falls: Secondary Verification Process Completed: Yes Signs or symptoms of abuse/neglect since last visito No Patient Requires Transmission-Based Precautions: No Hospitalized since last visit: No Patient Has Alerts: Yes Implantable device outside of the clinic excluding No Patient Alerts: Patient on Blood Thinner cellular tissue based products placed in the center since last visit: Has Dressing in Place as Prescribed: Yes Pain Present Now: No Electronic Signature(s) Signed: 07/23/2022 4:42:06 PM By: Thayer Dallas Entered By: Thayer Dallas on 07/23/2022 11:03:30 -------------------------------------------------------------------------------- Encounter Discharge Information Details Patient Name: Date of Service: [name] [name] 07/23/2022 10:45 A M Medical Record Number: [id number] Patient Account Number: [id number] Date of Birth/Sex: Treating RN: June 04, 1951 (71 y.o. Valma Cava Primary Care Hanna Aultman: Richarda Blade Other Clinician: Referring Zacharie Portner: Treating Trasean Delima/Extender: Jason Nest, [name] in Treatment:  1 Encounter Discharge Information Items Post Procedure Vitals Discharge Condition: Stable Temperature (F): 97.3 Ambulatory Status: Cane Pulse (bpm): 105 Discharge Destination: Home Respiratory Rate (breaths/min): 20 Transportation: Private Auto Blood Pressure (mmHg): 177/104 Accompanied By: daughter Schedule Follow-up Appointment: Yes Clinical Summary of Care: Electronic Signature(s) Signed: 07/24/2022 5:03:05 PM By: Tommie Ard RN Entered By: Tommie Ard on 07/23/2022 12:46:14 -------------------------------------------------------------------------------- Lower Extremity Assessment Details Patient Name: Date of Service: [name] A. 07/23/2022 10:45 A M Medical Record Number: [id number] Patient Account Number: [id number] Date of Birth/Sex: Treating RN: [birth year] (71 y.o. M) Primary Care Analiese Krupka: Richarda Blade Other Clinician: Referring Cruze Zingaro: Treating Verdell Kincannon/Extender: Jason Nest, [name] in Treatment: 1 Edema Assessment Assessed: [Left: No] [Right: No] Edema: [Left: Yes] [Right: Yes] Calf Left: Right: Point of Measurement: 39 cm From Medial Instep 37.4 cm 47 cm Ankle Left: Right: Point of Measurement: 8 cm From Medial Instep 25.5 cm 27 cm Electronic Signature(s) Signed: 07/23/2022 4:42:06 PM By: Thayer Dallas Entered By: Thayer Dallas on 07/23/2022 11:27:15 -------------------------------------------------------------------------------- Multi Wound Chart Details Patient Name: Date of Service: [name] [facility] A. 07/23/2022 10:45 A M Medical Record Number: [id number] Patient Account Number: [id number] Date of Birth/Sex: Treating RN: 1951-01-27 (71 y.o. M) Primary Care Ivey Nembhard: Richarda Blade Other Clinician: Referring Marquarius Lofton: Treating Tamber Burtch/Extender: Jason Nest, [name] in Treatment: 1 Vital Signs Height(in): 68 Pulse(bpm): 105 Weight(lbs): 185 Blood Pressure(mmHg): 177/104 Body Mass  Index(BMI): 28.1 Temperature(F): 97.3 Respiratory Rate(breaths/min): 20 Photos: Right Achilles Left, Plantar Foot Left T Fourth oe Wound Location: Surgical Injury Gradually Appeared Gradually Appeared Wounding Event: Diabetic Wound/Ulcer of the Lower Diabetic Wound/Ulcer of the Lower Diabetic Wound/Ulcer of the Lower Primary Etiology: Extremity Extremity Extremity Sleep Apnea, Arrhythmia, Congestive Sleep Apnea, Arrhythmia, Congestive Sleep Apnea, Arrhythmia, Congestive Comorbid History: Heart Failure, Hypertension, Peripheral Heart Failure, Hypertension, Peripheral Heart Failure, Hypertension, Peripheral Arterial Disease, Type II Diabetes Arterial Disease, Type II Diabetes Arterial Disease, Type II Diabetes 07/16/2022 03/09/2022 07/13/2022 Date Acquired:  1 1 1  Weeks of Treatment: Open Open Open Wound Status: No No No Wound Recurrence: 7x4.5x0.5 0.6x1x0.3 0.3x0.3x0.1 Measurements L x W x D (cm) 24.74 0.471 0.071 A (cm) : rea 12.37 0.141 0.007 Volume (cm) : -21.20% -114.10% 54.80% % Reduction in A rea: -21.20% -113.60% 56.30% % Reduction in Volume: Grade 2 Grade 2 Grade 1 Classification: Medium Medium Medium Exudate A mount: Serosanguineous Serosanguineous Serosanguineous Exudate Type: red, brown red, brown red, brown Exudate Color: Yes No No Foul Odor A Cleansing: fter No N/A N/A Odor A nticipated Due to Product Use: Distinct, outline attached Distinct, outline attached Distinct, outline attached Wound Margin: Small (1-33%) Large (67-100%) Large (67-100%) Granulation A mount: Red, Pink Pink, Pale Red Granulation Quality: Large (67-100%) Small (1-33%) None Present (0%) Necrotic A mount: Fat Layer (Subcutaneous Tissue): Yes Fat Layer (Subcutaneous Tissue): Yes Fat Layer (Subcutaneous Tissue): Yes Exposed Structures: Fascia: No Fascia: No Fascia: No Tendon: No Tendon: No Tendon: No Muscle: No Muscle: No Muscle: No Joint: No Joint: No Joint: No Bone:  No Bone: No Bone: No Small (1-33%) None Small (1-33%) Epithelialization: Debridement - Excisional N/A N/A Debridement: Pre-procedure Verification/Time Out 11:50 N/A N/A Taken: Necrotic/Eschar, Muscle, Tendon, N/A N/A Tissue Debrided: Subcutaneous, Slough Skin/Subcutaneous Tissue/Muscle N/A N/A Level: 31.5 N/A N/A Debridement A (sq cm): rea Curette, Forceps, Scissors N/A N/A Instrument: Moderate N/A N/A Bleeding: Pressure N/A N/A Hemostasis Achieved: 0 N/A N/A Procedural Pain: 0 N/A N/A Post Procedural Pain: Debridement Treatment Response: Procedure was tolerated well N/A N/A Post Debridement Measurements L x 7x4.5x0.5 N/A N/A W x D (cm) 12.37 N/A N/A Post Debridement Volume: (cm) Debridement N/A N/A Procedures Performed: Treatment Notes Electronic Signature(s) Signed: 07/23/2022 12:30:45 PM By: 07/25/2022 MD FACS Previous Signature: 07/23/2022 12:29:00 PM Version By: 07/25/2022 MD FACS Entered By: Duanne Guess on 07/23/2022 12:30:45 -------------------------------------------------------------------------------- Multi-Disciplinary Care Plan Details Patient Name: Date of Service: 07/25/2022 [facility] A. 07/23/2022 10:45 A M Medical Record Number: 07/25/2022 Patient Account Number: [id number] Date of Birth/Sex: Treating RN: 12-Dec-1950 (71 y.o. 62 Primary Care Kayela Humphres: Valma Cava Other Clinician: Referring Barrington Worley: Treating Jonas Goh/Extender: Richarda Blade, [name] in Treatment: 1 Active Inactive Nutrition Nursing Diagnoses: Potential for alteratiion in Nutrition/Potential for imbalanced nutrition Goals: Patient/caregiver agrees to and verbalizes understanding of need to use nutritional supplements and/or vitamins as prescribed Date Initiated: 07/16/2022 Target Resolution Date: 08/14/2022 Goal Status: Active Patient/caregiver will maintain therapeutic glucose control Date Initiated: 07/16/2022 Target Resolution  Date: 08/14/2022 Goal Status: Active Interventions: Assess HgA1c results as ordered upon admission and as needed Provide education on nutrition Treatment Activities: Obtain HgA1c : 07/16/2022 Patient referred to Primary Care Physician for further nutritional evaluation : 07/16/2022 Notes: Orientation to the Wound Care Program Nursing Diagnoses: Knowledge deficit related to the wound healing center program Goals: Patient/caregiver will verbalize understanding of the Wound Healing Center Program Date Initiated: 07/16/2022 Target Resolution Date: 08/14/2022 Goal Status: Active Interventions: Provide education on orientation to the wound center Notes: Pain, Acute or Chronic Nursing Diagnoses: Pain, acute or chronic: actual or potential Potential alteration in comfort, pain Goals: Patient will verbalize adequate pain control and receive pain control interventions during procedures as needed Date Initiated: 07/16/2022 Target Resolution Date: 08/12/2022 Goal Status: Active Interventions: Encourage patient to take pain medications as prescribed Provide education on pain management Reposition patient for comfort Treatment Activities: Administer pain control measures as ordered : 07/16/2022 Notes: Wound/Skin Impairment Nursing Diagnoses: Knowledge deficit related to ulceration/compromised skin integrity Goals: Patient/caregiver will verbalize understanding  of skin care regimen Date Initiated: 07/16/2022 Target Resolution Date: 08/13/2022 Goal Status: Active Interventions: Assess patient/caregiver ability to perform ulcer/skin care regimen upon admission and as needed Assess ulceration(s) every visit Provide education on ulcer and skin care Treatment Activities: Skin care regimen initiated : 07/16/2022 Topical wound management initiated : 07/16/2022 Notes: Electronic Signature(s) Signed: 07/24/2022 5:03:05 PM By: Blanche East RN Entered By: Blanche East on 07/23/2022  11:29:16 -------------------------------------------------------------------------------- Pain Assessment Details Patient Name: Date of Service: [name] [facility] A. 07/23/2022 10:45 A M Medical Record Number: [id number] Patient Account Number: [id number] Date of Birth/Sex: Treating RN: 1951/07/12 (71 y.o. M) Primary Care Roselyn Doby: [name] Other Clinician: Referring Bode Pieper: Treating Login Muckleroy/Extender: Arby Barrette, [name] in Treatment: 1 Active Problems Location of Pain Severity and Description of Pain Patient Has Paino No Site Locations Pain Management and Medication Current Pain Management: Electronic Signature(s) Signed: 07/23/2022 4:42:06 PM By: Erenest Blank Entered By: Erenest Blank on 07/23/2022 11:07:00 -------------------------------------------------------------------------------- Patient/Caregiver Education Details Patient Name: Date of Service: Neal Dy A. 9/14/2023andnbsp10:45 [place] Record Number: [id number] Patient Account Number: [id number] Date of Birth/Gender: Treating RN: 1951/01/11 (71 y.o. [name] Primary Care Physician: [name] Other Clinician: Referring Physician: Treating Physician/Extender: [name] in Treatment: 1 Education Assessment Education Provided To: Patient Education Topics Provided Nutrition: Methods: Explain/Verbal Responses: Reinforcements needed, State content correctly Pain: Methods: Explain/Verbal Responses: Reinforcements needed, State content correctly Wound/Skin Impairment: Methods: Explain/Verbal Responses: Reinforcements needed, State content correctly Electronic Signature(s) Signed: 07/24/2022 5:03:05 PM By: Blanche East RN Entered By: Blanche East on 07/23/2022 11:29:37 -------------------------------------------------------------------------------- Wound Assessment Details Patient Name: Date of Service: [name] [facility]  A. 07/23/2022 10:45 A M Medical Record Number: [id number] Patient Account Number: [id number] Date of Birth/Sex: Treating RN: November 11, 1950 (71 y.o. M) Primary Care Kairyn Olmeda: [name] Other Clinician: Referring Marlan Steward: Treating Kent Riendeau/Extender: Arby Barrette, [name] in Treatment: 1 Wound Status Wound Number: 1 Primary Diabetic Wound/Ulcer of the Lower Extremity Etiology: Wound Location: Right Achilles Wound Open Wounding Event: Surgical Injury Status: Date Acquired: 07/16/2022 Comorbid Sleep Apnea, Arrhythmia, Congestive Heart Failure, Hypertension, Weeks Of Treatment: 1 History: Peripheral Arterial Disease, Type II Diabetes Clustered Wound: No Photos Wound Measurements Length: (cm) 7 Width: (cm) 4.5 Depth: (cm) 0.5 Area: (cm) 24.74 Volume: (cm) 12.37 % Reduction in Area: -21.2% % Reduction in Volume: -21.2% Epithelialization: Small (1-33%) Wound Description Classification: Grade 2 Wound Margin: Distinct, outline attached Exudate Amount: Medium Exudate Type: Serosanguineous Exudate Color: red, brown Foul Odor After Cleansing: Yes Due to Product Use: No Slough/Fibrino Yes Wound Bed Granulation Amount: Small (1-33%) Exposed Structure Granulation Quality: Red, Pink Fascia Exposed: No Necrotic Amount: Large (67-100%) Fat Layer (Subcutaneous Tissue) Exposed: Yes Necrotic Quality: Adherent Slough Tendon Exposed: No Muscle Exposed: No Joint Exposed: No Bone Exposed: No Treatment Notes Wound #1 (Achilles) Wound Laterality: Right Cleanser Wound Cleanser Discharge Instruction: Cleanse the wound with wound cleanser prior to applying a clean dressing using gauze sponges, not tissue or cotton balls. Peri-Wound Care Topical Primary Dressing Iodosorb Gel 10 (gm) Tube Discharge Instruction: Apply to wound bed as instructed Dakin's Solution 0.25%, 16 (oz) Discharge Instruction: Moisten gauze with Dakin's solution Secondary Dressing ABD Pad,  5x9 Discharge Instruction: Apply over primary dressing as directed. Woven Gauze Sponge, Non-Sterile 4x4 in Discharge Instruction: Apply over primary dressing as directed. Secured With [employer], 4.5x3.1 (in/yd) Discharge Instruction: Secure with Kerlix as directed. 53M Medipore H Soft Cloth Surgical T ape, 4 x  10 (in/yd) Discharge Instruction: Secure with tape as directed. Compression Wrap Compression Stockings Add-Ons Electronic Signature(s) Signed: 07/24/2022 5:03:05 PM By: Blanche East RN Entered By: Blanche East on 07/23/2022 11:33:53 -------------------------------------------------------------------------------- Wound Assessment Details Patient Name: Date of Service: [name] [facility] A. 07/23/2022 10:45 A M Medical Record Number: [id number] Patient Account Number: [id number] Date of Birth/Sex: Treating RN: [birth year] (71 y.o. M) Primary Care Everley Evora: [name] Other Clinician: Referring Adham Johnson: Treating Naira Standiford/Extender: Arby Barrette, [name] in Treatment: 1 Wound Status Wound Number: 2 Primary Diabetic Wound/Ulcer of the Lower Extremity Etiology: Wound Location: Left, Plantar Foot Wound Open Wounding Event: Gradually Appeared Status: Date Acquired: 03/09/2022 Comorbid Sleep Apnea, Arrhythmia, Congestive Heart Failure, Hypertension, Weeks Of Treatment: 1 History: Peripheral Arterial Disease, Type II Diabetes Clustered Wound: No Photos Wound Measurements Length: (cm) 0.6 Width: (cm) 1 Depth: (cm) 0.3 Area: (cm) 0.471 Volume: (cm) 0.141 % Reduction in Area: -114.1% % Reduction in Volume: -113.6% Epithelialization: None Tunneling: No Undermining: No Wound Description Classification: Grade 2 Wound Margin: Distinct, outline attached Exudate Amount: Medium Exudate Type: Serosanguineous Exudate Color: red, brown Foul Odor After Cleansing: No Slough/Fibrino No Wound Bed Granulation Amount: Large (67-100%) Exposed  Structure Granulation Quality: Pink, Pale Fascia Exposed: No Necrotic Amount: Small (1-33%) Fat Layer (Subcutaneous Tissue) Exposed: Yes Necrotic Quality: Adherent Slough Tendon Exposed: No Muscle Exposed: No Joint Exposed: No Bone Exposed: No Treatment Notes Wound #2 (Foot) Wound Laterality: Plantar, Left Cleanser Soap and Water Discharge Instruction: May shower and wash wound with dial antibacterial soap and water prior to dressing change. Wound Cleanser Discharge Instruction: Cleanse the wound with wound cleanser prior to applying a clean dressing using gauze sponges, not tissue or cotton balls. Peri-Wound Care Skin Prep Discharge Instruction: Use skin prep as directed Topical Primary Dressing MediHoney Gel, tube 1.5 (oz) Discharge Instruction: Apply to wound bed as instructed Secondary Dressing Optifoam Non-Adhesive Dressing, 4x4 in Discharge Instruction: Apply over primary dressing as directed. Woven Gauze Sponges 2x2 in Discharge Instruction: Apply over primary dressing as directed. Secured With [employer], 4.5x3.1 (in/yd) Discharge Instruction: Secure with Kerlix as directed. 45M Medipore H Soft Cloth Surgical T ape, 4 x 10 (in/yd) Discharge Instruction: Secure with tape as directed. Compression Wrap Compression Stockings Add-Ons Electronic Signature(s) Signed: 07/24/2022 5:03:05 PM By: Blanche East RN Entered By: Blanche East on 07/23/2022 11:34:47 -------------------------------------------------------------------------------- Wound Assessment Details Patient Name: Date of Service: [name] [facility] A. 07/23/2022 10:45 A M Medical Record Number: [id number] Patient Account Number: [id number] Date of Birth/Sex: Treating RN: 1951-08-26 (71 y.o. M) Primary Care [place]: [name] Other Clinician: Referring Annesha Delgreco: Treating Arabela Basaldua/Extender: Arby Barrette, [name] in Treatment: 1 Wound Status Wound Number: 3 Primary Diabetic  Wound/Ulcer of the Lower Extremity Etiology: Wound Location: Left T Fourth oe Wound Open Wounding Event: Gradually Appeared Status: Date Acquired: 07/13/2022 Comorbid Sleep Apnea, Arrhythmia, Congestive Heart Failure, Hypertension, Weeks Of Treatment: 1 History: Peripheral Arterial Disease, Type II Diabetes Clustered Wound: No Photos Wound Measurements Length: (cm) 0.3 Width: (cm) 0.3 Depth: (cm) 0.1 Area: (cm) 0.071 Volume: (cm) 0.007 % Reduction in Area: 54.8% % Reduction in Volume: 56.3% Epithelialization: Small (1-33%) Tunneling: No Undermining: No Wound Description Classification: Grade 1 Wound Margin: Distinct, outline attached Exudate Amount: Medium Exudate Type: Serosanguineous Exudate Color: red, brown Foul Odor After Cleansing: No Slough/Fibrino No Wound Bed Granulation Amount: Large (67-100%) Exposed Structure Granulation Quality: Red Fascia Exposed: No Necrotic Amount: None Present (0%) Fat Layer (Subcutaneous Tissue) Exposed: Yes Tendon Exposed: No  Muscle Exposed: No Joint Exposed: No Bone Exposed: No Treatment Notes Wound #3 (Toe Fourth) Wound Laterality: Left Cleanser Soap and Water Discharge Instruction: May shower and wash wound with dial antibacterial soap and water prior to dressing change. Wound Cleanser Discharge Instruction: Cleanse the wound with wound cleanser prior to applying a clean dressing using gauze sponges, not tissue or cotton balls. Peri-Wound Care Topical bactracin antibiotic ointment Discharge Instruction: apply daily. Primary Dressing Secondary Dressing bandaid Discharge Instruction: apply over the antibiotic ointment. Secured With Compression Wrap Compression Stockings Environmental education officer) Signed: 07/24/2022 5:03:05 PM By: Blanche East RN Entered By: Blanche East on 07/23/2022 11:35:26 -------------------------------------------------------------------------------- Vitals Details Patient Name: Date of  Service: [name] 07/23/2022 10:45 A M Medical Record Number: [id number] Patient Account Number: [id number] Date of Birth/Sex: Treating RN: 07-26-1951 (71 y.o. M) Primary Care Dorthea Maina: [name] Other Clinician: Referring Caspar Favila: Treating Larah Kuntzman/Extender: Arby Barrette, [name] in Treatment: 1 Vital Signs Time Taken: 11:03 Temperature (F): 97.3 Height (in): 68 Pulse (bpm): 105 Weight (lbs): 185 Respiratory Rate (breaths/min): 20 Body Mass Index (BMI): 28.1 Blood Pressure (mmHg): 177/104 Reference Range: 80 - 120 mg / dl Electronic Signature(s) Signed: 07/23/2022 4:42:06 PM By: Erenest Blank Entered By: Erenest Blank on 07/23/2022 11:06:54

## 2022-07-27 DIAGNOSIS — E11621 Type 2 diabetes mellitus with foot ulcer: Secondary | ICD-10-CM | POA: Diagnosis not present

## 2022-07-27 DIAGNOSIS — S81801A Unspecified open wound, right lower leg, initial encounter: Secondary | ICD-10-CM | POA: Diagnosis not present

## 2022-07-27 DIAGNOSIS — L97813 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: Secondary | ICD-10-CM | POA: Diagnosis not present

## 2022-07-27 DIAGNOSIS — L97522 Non-pressure chronic ulcer of other part of left foot with fat layer exposed: Secondary | ICD-10-CM | POA: Diagnosis not present

## 2022-07-27 DIAGNOSIS — I739 Peripheral vascular disease, unspecified: Secondary | ICD-10-CM | POA: Diagnosis not present

## 2022-07-30 ENCOUNTER — Encounter (HOSPITAL_BASED_OUTPATIENT_CLINIC_OR_DEPARTMENT_OTHER): Payer: Medicare HMO | Admitting: Internal Medicine

## 2022-07-30 ENCOUNTER — Ambulatory Visit (HOSPITAL_BASED_OUTPATIENT_CLINIC_OR_DEPARTMENT_OTHER): Payer: Medicare HMO | Admitting: Internal Medicine

## 2022-07-30 DIAGNOSIS — L97813 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: Secondary | ICD-10-CM | POA: Diagnosis not present

## 2022-07-30 DIAGNOSIS — E1151 Type 2 diabetes mellitus with diabetic peripheral angiopathy without gangrene: Secondary | ICD-10-CM | POA: Diagnosis not present

## 2022-07-30 DIAGNOSIS — L97522 Non-pressure chronic ulcer of other part of left foot with fat layer exposed: Secondary | ICD-10-CM | POA: Diagnosis not present

## 2022-07-30 DIAGNOSIS — E1122 Type 2 diabetes mellitus with diabetic chronic kidney disease: Secondary | ICD-10-CM | POA: Diagnosis not present

## 2022-07-30 DIAGNOSIS — E1142 Type 2 diabetes mellitus with diabetic polyneuropathy: Secondary | ICD-10-CM | POA: Diagnosis not present

## 2022-07-30 DIAGNOSIS — L97518 Non-pressure chronic ulcer of other part of right foot with other specified severity: Secondary | ICD-10-CM | POA: Diagnosis not present

## 2022-07-30 DIAGNOSIS — E11621 Type 2 diabetes mellitus with foot ulcer: Secondary | ICD-10-CM | POA: Diagnosis not present

## 2022-07-30 DIAGNOSIS — N183 Chronic kidney disease, stage 3 unspecified: Secondary | ICD-10-CM | POA: Diagnosis not present

## 2022-07-30 DIAGNOSIS — G4733 Obstructive sleep apnea (adult) (pediatric): Secondary | ICD-10-CM | POA: Diagnosis not present

## 2022-07-30 DIAGNOSIS — I130 Hypertensive heart and chronic kidney disease with heart failure and stage 1 through stage 4 chronic kidney disease, or unspecified chronic kidney disease: Secondary | ICD-10-CM | POA: Diagnosis not present

## 2022-07-31 NOTE — Progress Notes (Signed)
KEKOA, DISBROW ([id number]) Visit Report for 07/30/2022 Arrival Information Details Patient Name: Date of Service: [name] [name] [facility] A. 07/30/2022 3:45 PM Medical Record Number: [id number] Patient Account Number: [id number] Date of Birth/Sex: Treating RN: 06/27/1951 (71 y.o. [name], Meta.Reding Primary Care Abel Hageman: [name] Other Clinician: Referring Lorriane Dehart: Treating Kristi Norment/Extender: [name] Ngetich, [name] in Treatment: 2 Visit Information History Since Last Visit Added or deleted any medications: No Patient Arrived: [name] Any new allergies or adverse reactions: No Arrival Time: 16:01 Had a fall or experienced change in No Accompanied By: daughter activities of daily living that may affect Transfer Assistance: None risk of falls: Patient Requires Transmission-Based Precautions: No Signs or symptoms of abuse/neglect since last visito No Patient Has Alerts: Yes Hospitalized since last visit: No Patient Alerts: Patient on Blood Thinner Implantable device outside of the clinic excluding No cellular tissue based products placed in the center since last visit: Has Compression in Place as Prescribed: Yes Pain Present Now: No Electronic Signature(s) Signed: 07/31/2022 12:30:24 PM By: Erenest Blank Entered By: Erenest Blank on 07/30/2022 16:01:29 -------------------------------------------------------------------------------- Compression Therapy Details Patient Name: Date of Service: [name] [facility] A. 07/30/2022 3:45 PM Medical Record Number: [id number] Patient Account Number: [id number] Date of Birth/Sex: Treating RN: 1951-05-30 (71 y.o. [name] Primary Care Markey Deady: [name] Other Clinician: Referring Savaya Hakes: Treating Alen Matheson/Extender: [name] Ngetich, [name] in Treatment: 2 Compression Therapy Performed for Wound Assessment: Wound #1 Right Achilles Performed By: Clinician Deon Pilling, RN Compression Type:  Three Layer Post Procedure Diagnosis Same as Pre-procedure Electronic Signature(s) Signed: 07/30/2022 5:58:31 PM By: Deon Pilling RN, BSN Entered By: Deon Pilling on 07/30/2022 16:42:21 -------------------------------------------------------------------------------- Encounter Discharge Information Details Patient Name: Date of Service: [name] [facility] A. 07/30/2022 3:45 PM Medical Record Number: [id number] Patient Account Number: [id number] Date of Birth/Sex: Treating RN: 10/31/1951 (71 y.o. [name] Primary Care Miasha Emmons: [name] Other Clinician: Referring Wilfred Siverson: Treating Mason Burleigh/Extender: [name] Ngetich, [name] in Treatment: 2 Encounter Discharge Information Items Post Procedure Vitals Discharge Condition: Stable Temperature (F): 98.3 Ambulatory Status: Ambulatory Pulse (bpm): 109 Discharge Destination: Home Respiratory Rate (breaths/min): 20 Transportation: Private Auto Blood Pressure (mmHg): 153/93 Accompanied By: daughter Schedule Follow-up Appointment: Yes Clinical Summary of Care: Electronic Signature(s) Signed: 07/30/2022 5:58:31 PM By: Deon Pilling RN, BSN Entered By: Deon Pilling on 07/30/2022 16:43:16 -------------------------------------------------------------------------------- Lower Extremity Assessment Details Patient Name: Date of Service: [name] [facility] A. 07/30/2022 3:45 PM Medical Record Number: [id number] Patient Account Number: [id number] Date of Birth/Sex: Treating RN: 09/18/1951 (71 y.o. [name] Primary Care Boleslaus Holloway: [name] Other Clinician: Referring Angela Platner: Treating Brenson Hartman/Extender: [name] Ngetich, [name] in Treatment: 2 Edema Assessment Assessed: [Left: No] [Right: No] Edema: [Left: Yes] [Right: Yes] Calf Left: Right: Point of Measurement: 39 cm From Medial Instep 38 cm 47.5 cm Ankle Left: Right: Point of Measurement: 8 cm From Medial Instep 26 cm 27  cm Electronic Signature(s) Signed: 07/30/2022 5:58:31 PM By: Deon Pilling RN, BSN Signed: 07/31/2022 12:30:24 PM By: Erenest Blank Entered By: Erenest Blank on 07/30/2022 16:06:46 -------------------------------------------------------------------------------- Multi Wound Chart Details Patient Name: Date of Service: [name] [facility] A. 07/30/2022 3:45 PM Medical Record Number: [id number] Patient Account Number: [id number] Date of Birth/Sex: Treating RN: 09-Nov-1951 (71 y.o. [name] Primary Care Arrin Ishler: [name] Other Clinician: Referring Rush Salce: Treating Mailani Degroote/Extender: [name] Ngetich, [name] in Treatment: 2 Vital Signs Height(in): 68 Pulse(bpm): 109 Weight(lbs): 185 Blood Pressure(mmHg): 153/93  Body Mass Index(BMI): 28.1 Temperature(F): 98.3 Respiratory Rate(breaths/min): 20 Photos: Right Achilles Left, Plantar Foot Left T Fourth oe Wound Location: Surgical Injury Gradually Appeared Gradually Appeared Wounding Event: Diabetic Wound/Ulcer of the Lower Diabetic Wound/Ulcer of the Lower Diabetic Wound/Ulcer of the Lower Primary Etiology: Extremity Extremity Extremity Sleep Apnea, Arrhythmia, Congestive Sleep Apnea, Arrhythmia, Congestive Sleep Apnea, Arrhythmia, Congestive Comorbid History: Heart Failure, Hypertension, Peripheral Heart Failure, Hypertension, Peripheral Heart Failure, Hypertension, Peripheral Arterial Disease, Type II Diabetes Arterial Disease, Type II Diabetes Arterial Disease, Type II Diabetes 07/16/2022 03/09/2022 07/13/2022 Date Acquired: 2 2 2  Weeks of Treatment: Open Open Healed - Epithelialized Wound Status: No No No Wound Recurrence: 7x3.5x0.3 1x0.7x0.3 0x0x0 Measurements L x W x D (cm) 19.242 0.55 0 A (cm) : rea 5.773 0.165 0 Volume (cm) : 5.80% -150.00% 100.00% % Reduction in A rea: 43.50% -150.00% 100.00% % Reduction in Volume: 6 Starting Position 1 (o'clock): 12 Ending Position 1  (o'clock): 0.1 Maximum Distance 1 (cm): No Yes No Undermining: Grade 2 Grade 2 Grade 1 Classification: Medium Medium Medium Exudate A mount: Serosanguineous Serosanguineous Serosanguineous Exudate Type: red, brown red, brown red, brown Exudate Color: Yes No No Foul Odor A Cleansing: fter No N/A N/A Odor A nticipated Due to Product Use: Distinct, outline attached Distinct, outline attached Distinct, outline attached Wound Margin: Small (1-33%) Large (67-100%) Large (67-100%) Granulation A mount: Red, Pink Pink, Pale Red Granulation Quality: Large (67-100%) Small (1-33%) None Present (0%) Necrotic A mount: Fat Layer (Subcutaneous Tissue): Yes Fat Layer (Subcutaneous Tissue): Yes Fat Layer (Subcutaneous Tissue): Yes Exposed Structures: Fascia: No Fascia: No Fascia: No Tendon: No Tendon: No Tendon: No Muscle: No Muscle: No Muscle: No Joint: No Joint: No Joint: No Bone: No Bone: No Bone: No Small (1-33%) None Small (1-33%) Epithelialization: Debridement - Excisional N/A N/A Debridement: Pre-procedure Verification/Time Out 16:30 N/A N/A Taken: Lidocaine 5% topical ointment N/A N/A Pain Control: Subcutaneous, Slough N/A N/A Tissue Debrided: Skin/Subcutaneous Tissue N/A N/A Level: 24.5 N/A N/A Debridement A (sq cm): rea Curette N/A N/A Instrument: Minimum N/A N/A Bleeding: Pressure N/A N/A Hemostasis A chieved: 0 N/A N/A Procedural Pain: 0 N/A N/A Post Procedural Pain: Procedure was tolerated well N/A N/A Debridement Treatment Response: 7x3.5x0.3 N/A N/A Post Debridement Measurements L x W x D (cm) 5.773 N/A N/A Post Debridement Volume: (cm) Compression Therapy N/A N/A Procedures Performed: Debridement Treatment Notes Wound #1 (Achilles) Wound Laterality: Right Cleanser Soap and Water Discharge Instruction: May shower and wash wound with dial antibacterial soap and water prior to dressing change. Wound Cleanser Discharge Instruction:  Cleanse the wound with wound cleanser prior to applying a clean dressing using gauze sponges, not tissue or cotton balls. Peri-Wound Care Sween Lotion (Moisturizing lotion) Discharge Instruction: Apply moisturizing lotion as directed Topical Primary Dressing Hydrofera Blue Classic Foam, 4x4 in Discharge Instruction: Moisten with saline prior to applying to wound bed Santyl Ointment Discharge Instruction: Apply nickel thick amount to wound bed as instructed Secondary Dressing ABD Pad, 5x9 Discharge Instruction: Apply over primary dressing as directed. Woven Gauze Sponge, Non-Sterile 4x4 in Discharge Instruction: Apply over primary dressing as directed. Secured With Compression Wrap ThreePress (3 layer compression wrap) Discharge Instruction: Apply three layer compression as directed. Compression Stockings Add-Ons Wound #2 (Foot) Wound Laterality: Plantar, Left Cleanser Soap and Water Discharge Instruction: May shower and wash wound with dial antibacterial soap and water prior to dressing change. Wound Cleanser Discharge Instruction: Cleanse the wound with wound cleanser prior to applying a clean dressing using gauze sponges, not  tissue or cotton balls. Peri-Wound Care Skin Prep Discharge Instruction: Use skin prep as directed Topical Primary Dressing KerraCel Ag Gelling Fiber Dressing, 4x5 in (silver alginate) Discharge Instruction: Apply silver alginate to wound bed as instructed MediHoney Gel, tube 1.5 (oz) Discharge Instruction: Apply to wound bed as instructed Secondary Dressing Optifoam Non-Adhesive Dressing, 4x4 in Discharge Instruction: Apply over primary dressing as directed. Woven Gauze Sponges 2x2 in Discharge Instruction: Apply over primary dressing as directed. Secured With [employer], 4.5x3.1 (in/yd) Discharge Instruction: Secure with Kerlix as directed. 64M Medipore H Soft Cloth Surgical T ape, 4 x 10 (in/yd) Discharge Instruction: Secure with tape as  directed. Compression Wrap Compression Stockings Add-Ons Wound #3 (Toe Fourth) Wound Laterality: Left Cleanser Peri-Wound Care Topical Primary Dressing Secondary Dressing Secured With Compression Wrap Compression Stockings Add-Ons Electronic Signature(s) Signed: 07/30/2022 5:58:31 PM By: Deon Pilling RN, BSN Signed: 07/31/2022 9:20:47 AM By: [name] DO Entered By: [name] on 07/30/2022 16:53:57 -------------------------------------------------------------------------------- Multi-Disciplinary Care Plan Details Patient Name: Date of Service: [name] [facility] A. 07/30/2022 3:45 PM Medical Record Number: [id number] Patient Account Number: [id number] Date of Birth/Sex: Treating RN: 1951/01/29 (71 y.o. [name] Primary Care Ethel Meisenheimer: [name] Other Clinician: Referring Lucia Mccreadie: Treating Diyan Dave/Extender: [name] Ngetich, [name] in Treatment: 2 Active Inactive Nutrition Nursing Diagnoses: Potential for alteratiion in Nutrition/Potential for imbalanced nutrition Goals: Patient/caregiver agrees to and verbalizes understanding of need to use nutritional supplements and/or vitamins as prescribed Date Initiated: 07/16/2022 Target Resolution Date: 08/14/2022 Goal Status: Active Patient/caregiver will maintain therapeutic glucose control Date Initiated: 07/16/2022 Target Resolution Date: 08/14/2022 Goal Status: Active Interventions: Assess HgA1c results as ordered upon admission and as needed Provide education on nutrition Treatment Activities: Education provided on Nutrition : 07/23/2022 Obtain HgA1c : 07/16/2022 Patient referred to Primary Care Physician for further nutritional evaluation : 07/16/2022 Notes: Pain, Acute or Chronic Nursing Diagnoses: Pain, acute or chronic: actual or potential Potential alteration in comfort, pain Goals: Patient will verbalize adequate pain control and receive pain control interventions during  procedures as needed Date Initiated: 07/16/2022 Target Resolution Date: 08/12/2022 Goal Status: Active Interventions: Encourage patient to take pain medications as prescribed Provide education on pain management Reposition patient for comfort Treatment Activities: Administer pain control measures as ordered : 07/16/2022 Notes: Wound/Skin Impairment Nursing Diagnoses: Knowledge deficit related to ulceration/compromised skin integrity Goals: Patient/caregiver will verbalize understanding of skin care regimen Date Initiated: 07/16/2022 Target Resolution Date: 08/13/2022 Goal Status: Active Interventions: Assess patient/caregiver ability to perform ulcer/skin care regimen upon admission and as needed Assess ulceration(s) every visit Provide education on ulcer and skin care Treatment Activities: Skin care regimen initiated : 07/16/2022 Topical wound management initiated : 07/16/2022 Notes: Electronic Signature(s) Signed: 07/31/2022 12:35:09 PM By: Rhae Hammock RN Entered By: Rhae Hammock on 07/30/2022 16:32:54 -------------------------------------------------------------------------------- Pain Assessment Details Patient Name: Date of Service: [name] [name] 07/30/2022 3:45 PM Medical Record Number: [id number] Patient Account Number: [id number] Date of Birth/Sex: Treating RN: 05/28/1951 (71 y.o. [name] Primary Care Gusta Marksberry: [name] Other Clinician: Referring Cullin Dishman: Treating Aswad Wandrey/Extender: [name] Ngetich, [name] in Treatment: 2 Active Problems Location of Pain Severity and Description of Pain Patient Has Paino No Site Locations Pain Management and Medication Current Pain Management: Electronic Signature(s) Signed: 07/30/2022 5:58:31 PM By: Deon Pilling RN, BSN Signed: 07/31/2022 12:30:24 PM By: Erenest Blank Entered By: Erenest Blank on 07/30/2022  16:02:39 -------------------------------------------------------------------------------- Patient/Caregiver Education Details Patient Name: Date of Service: Darren Downer DRO [name] [name] 9/21/2023andnbsp3:45  PM Medical Record Number: [id number] Patient Account Number: [id number] Date of Birth/Gender: Treating RN: 1951/02/09 (71 y.o. [name] Primary Care Physician: [name] Other Clinician: Referring Physician: Treating Physician/Extender: [name] in Treatment: 2 Education Assessment Education Provided To: Patient Education Topics Provided Wound/Skin Impairment: Methods: Explain/Verbal Responses: Reinforcements needed, State content correctly Electronic Signature(s) Signed: 07/31/2022 12:35:09 PM By: Rhae Hammock RN Entered By: Rhae Hammock on 07/30/2022 16:33:04 -------------------------------------------------------------------------------- Wound Assessment Details Patient Name: Date of Service: [name] [facility] A. 07/30/2022 3:45 PM Medical Record Number: [id number] Patient Account Number: [id number] Date of Birth/Sex: Treating RN: 1951-05-20 (71 y.o. [name] Primary Care Angeliki Mates: [name] Other Clinician: Referring Esme Freund: Treating Copelan Maultsby/Extender: [name] Ngetich, [name] in Treatment: 2 Wound Status Wound Number: 1 Primary Diabetic Wound/Ulcer of the Lower Extremity Etiology: Wound Location: Right Achilles Wound Open Wounding Event: Surgical Injury Status: Date Acquired: 07/16/2022 Comorbid Sleep Apnea, Arrhythmia, Congestive Heart Failure, Hypertension, Weeks Of Treatment: 2 History: Peripheral Arterial Disease, Type II Diabetes Clustered Wound: No Photos Wound Measurements Length: (cm) 7 Width: (cm) 3.5 Depth: (cm) 0.3 Area: (cm) 19.242 Volume: (cm) 5.773 % Reduction in Area: 5.8% % Reduction in Volume: 43.5% Epithelialization: Small (1-33%) Tunneling:  No Undermining: No Wound Description Classification: Grade 2 Wound Margin: Distinct, outline attached Exudate Amount: Medium Exudate Type: Serosanguineous Exudate Color: red, brown Foul Odor After Cleansing: Yes Due to Product Use: No Slough/Fibrino Yes Wound Bed Granulation Amount: Small (1-33%) Exposed Structure Granulation Quality: Red, Pink Fascia Exposed: No Necrotic Amount: Large (67-100%) Fat Layer (Subcutaneous Tissue) Exposed: Yes Necrotic Quality: Adherent Slough Tendon Exposed: No Muscle Exposed: No Joint Exposed: No Bone Exposed: No Treatment Notes Wound #1 (Achilles) Wound Laterality: Right Cleanser Soap and Water Discharge Instruction: May shower and wash wound with dial antibacterial soap and water prior to dressing change. Wound Cleanser Discharge Instruction: Cleanse the wound with wound cleanser prior to applying a clean dressing using gauze sponges, not tissue or cotton balls. Peri-Wound Care Sween Lotion (Moisturizing lotion) Discharge Instruction: Apply moisturizing lotion as directed Topical Primary Dressing Hydrofera Blue Classic Foam, 4x4 in Discharge Instruction: Moisten with saline prior to applying to wound bed Santyl Ointment Discharge Instruction: Apply nickel thick amount to wound bed as instructed Secondary Dressing ABD Pad, 5x9 Discharge Instruction: Apply over primary dressing as directed. Woven Gauze Sponge, Non-Sterile 4x4 in Discharge Instruction: Apply over primary dressing as directed. Secured With Compression Wrap ThreePress (3 layer compression wrap) Discharge Instruction: Apply three layer compression as directed. Compression Stockings Add-Ons Electronic Signature(s) Signed: 07/30/2022 5:58:31 PM By: Deon Pilling RN, BSN Signed: 07/31/2022 12:30:24 PM By: Erenest Blank Entered By: Erenest Blank on 07/30/2022 16:14:04 -------------------------------------------------------------------------------- Wound Assessment  Details Patient Name: Date of Service: [name] [facility] A. 07/30/2022 3:45 PM Medical Record Number: [id number] Patient Account Number: [id number] Date of Birth/Sex: Treating RN: 12/13/1950 (71 y.o. [name] Primary Care Vila Dory: [name] Other Clinician: Referring Cinzia Devos: Treating Finnis Colee/Extender: [name] Ngetich, [name] in Treatment: 2 Wound Status Wound Number: 2 Primary Diabetic Wound/Ulcer of the Lower Extremity Etiology: Wound Location: Left, Plantar Foot Wound Open Wounding Event: Gradually Appeared Status: Date Acquired: 03/09/2022 Comorbid Sleep Apnea, Arrhythmia, Congestive Heart Failure, Hypertension, Weeks Of Treatment: 2 History: Peripheral Arterial Disease, Type II Diabetes Clustered Wound: No Photos Wound Measurements Length: (cm) 1 Width: (cm) 0.7 Depth: (cm) 0.3 Area: (cm) 0.55 Volume: (cm) 0.165 % Reduction in Area: -150% % Reduction in Volume: -150% Epithelialization: None Tunneling: No Undermining:  Yes Starting Position (o'clock): 6 Ending Position (o'clock): 12 Maximum Distance: (cm) 0.1 Wound Description Classification: Grade 2 Wound Margin: Distinct, outline attached Exudate Amount: Medium Exudate Type: Serosanguineous Exudate Color: red, brown Wound Bed Granulation Amount: Large (67-100%) Granulation Quality: Pink, Pale Necrotic Amount: Small (1-33%) Necrotic Quality: Adherent Slough Foul Odor After Cleansing: No Slough/Fibrino No Exposed Structure Fascia Exposed: No Fat Layer (Subcutaneous Tissue) Exposed: Yes Tendon Exposed: No Muscle Exposed: No Joint Exposed: No Bone Exposed: No Treatment Notes Wound #2 (Foot) Wound Laterality: Plantar, Left Cleanser Soap and Water Discharge Instruction: May shower and wash wound with dial antibacterial soap and water prior to dressing change. Wound Cleanser Discharge Instruction: Cleanse the wound with wound cleanser prior to applying a clean dressing  using gauze sponges, not tissue or cotton balls. Peri-Wound Care Skin Prep Discharge Instruction: Use skin prep as directed Topical Primary Dressing KerraCel Ag Gelling Fiber Dressing, 4x5 in (silver alginate) Discharge Instruction: Apply silver alginate to wound bed as instructed MediHoney Gel, tube 1.5 (oz) Discharge Instruction: Apply to wound bed as instructed Secondary Dressing Optifoam Non-Adhesive Dressing, 4x4 in Discharge Instruction: Apply over primary dressing as directed. Woven Gauze Sponges 2x2 in Discharge Instruction: Apply over primary dressing as directed. Secured With [employer], 4.5x3.1 (in/yd) Discharge Instruction: Secure with Kerlix as directed. 88M Medipore H Soft Cloth Surgical T ape, 4 x 10 (in/yd) Discharge Instruction: Secure with tape as directed. Compression Wrap Compression Stockings Add-Ons Electronic Signature(s) Signed: 07/30/2022 5:58:31 PM By: Deon Pilling RN, BSN Signed: 07/31/2022 12:30:24 PM By: Erenest Blank Entered By: Erenest Blank on 07/30/2022 16:15:17 -------------------------------------------------------------------------------- Wound Assessment Details Patient Name: Date of Service: [name] [facility] A. 07/30/2022 3:45 PM Medical Record Number: [id number] Patient Account Number: [id number] Date of Birth/Sex: Treating RN: 1951/11/09 (70 y.o. [name] Primary Care Karin Griffith: [name] Other Clinician: Referring Ercelle Winkles: Treating Amedeo Detweiler/Extender: [name] Ngetich, [name] in Treatment: 2 Wound Status Wound Number: 3 Primary Diabetic Wound/Ulcer of the Lower Extremity Etiology: Etiology: Wound Location: Left T Fourth oe Wound Healed - Epithelialized Wounding Event: Gradually Appeared Status: Date Acquired: 07/13/2022 Comorbid Sleep Apnea, Arrhythmia, Congestive Heart Failure, Hypertension, Weeks Of Treatment: 2 History: Peripheral Arterial Disease, Type II Diabetes Clustered Wound:  No Photos Wound Measurements Length: (cm) Width: (cm) Depth: (cm) Area: (cm) Volume: (cm) 0 % Reduction in Area: 100% 0 % Reduction in Volume: 100% 0 Epithelialization: Small (1-33%) 0 Tunneling: No 0 Undermining: No Wound Description Classification: Grade 1 Wound Margin: Distinct, outline attached Exudate Amount: Medium Exudate Type: Serosanguineous Exudate Color: red, brown Foul Odor After Cleansing: No Slough/Fibrino No Wound Bed Granulation Amount: Large (67-100%) Exposed Structure Granulation Quality: Red Fascia Exposed: No Necrotic Amount: None Present (0%) Fat Layer (Subcutaneous Tissue) Exposed: Yes Tendon Exposed: No Muscle Exposed: No Joint Exposed: No Bone Exposed: No Treatment Notes Wound #3 (Toe Fourth) Wound Laterality: Left Cleanser Peri-Wound Care Topical Primary Dressing Secondary Dressing Secured With Compression Wrap Compression Stockings Add-Ons Electronic Signature(s) Signed: 07/30/2022 5:58:31 PM By: Deon Pilling RN, BSN Entered By: Deon Pilling on 07/30/2022 16:37:30 -------------------------------------------------------------------------------- Vitals Details Patient Name: Date of Service: [name] [name] 07/30/2022 3:45 PM Medical Record Number: [id number] Patient Account Number: [id number] Date of Birth/Sex: Treating RN: [birth year] (71 y.o. [name] Primary Care Jed Kutch: [name] Other Clinician: Referring Elowyn Raupp: Treating Wenonah Milo/Extender: [name] Ngetich, [name] in Treatment: 2 Vital Signs Time Taken: 16:02 Temperature (F): 98.3 Height (in): 68 Pulse (bpm): 109 Weight (lbs): 185 Respiratory Rate (breaths/min):  20 Body Mass Index (BMI): 28.1 Blood Pressure (mmHg): 153/93 Reference Range: 80 - 120 mg / dl Electronic Signature(s) Signed: 07/31/2022 12:30:24 PM By: Erenest Blank Entered By: Erenest Blank on 07/30/2022 16:02:33

## 2022-07-31 NOTE — Progress Notes (Signed)
ISSAIC, WELLIVER ([id number]) Visit Report for 07/30/2022 Chief Complaint Document Details Patient Name: Date of Service: [name] [facility] A. 07/30/2022 3:45 PM Medical Record Number: [id number] Patient Account Number: [id number] Date of Birth/Sex: Treating RN: 1951/04/18 (71 y.o. [name] Primary Care Provider: Marlowe [name] Other Clinician: Referring Provider: Treating Provider/Extender: [name] Ngetich, [name] in Treatment: 2 Information Obtained from: Patient Chief Complaint 07/16/2022; bilateral lower extremity wounds Electronic Signature(s) Signed: 07/31/2022 9:20:47 AM By: Darren Shan DO Entered By: [name] on 07/30/2022 16:54:03 -------------------------------------------------------------------------------- Debridement Details Patient Name: Date of Service: [name] [facility] A. 07/30/2022 3:45 PM Medical Record Number: [id number] Patient Account Number: [id number] Date of Birth/Sex: Treating RN: 06-Dec-1950 (72 y.o. [name] Primary Care Provider: Marlowe [name] Other Clinician: Referring Provider: Treating Provider/Extender: [name] Ngetich, [name] in Treatment: 2 Debridement Performed for Assessment: Wound #1 Right Achilles Performed By: Physician Darren Shan, DO Debridement Type: Debridement Severity of Tissue Pre Debridement: Fat layer exposed Level of Consciousness (Pre-procedure): Awake and Alert Pre-procedure Verification/Time Out Yes - 16:30 Taken: Start Time: 16:31 Pain Control: Lidocaine 5% topical ointment T Area Debrided (L x W): otal 7 (cm) x 3.5 (cm) = 24.5 (cm) Tissue and other material debrided: Viable, Non-Viable, Slough, Subcutaneous, Skin: Dermis , Skin: Epidermis, Slough Level: Skin/Subcutaneous Tissue Debridement Description: Excisional Instrument: Curette Bleeding: Minimum Hemostasis Achieved: Pressure End Time: 16:40 Procedural Pain: 0 Post Procedural Pain: 0 Response to  Treatment: Procedure was tolerated well Level of Consciousness (Post- Awake and Alert procedure): Post Debridement Measurements of Total Wound Length: (cm) 7 Width: (cm) 3.5 Depth: (cm) 0.3 Volume: (cm) 5.773 Character of Wound/Ulcer Post Debridement: Improved Severity of Tissue Post Debridement: Fat layer exposed Post Procedure Diagnosis Same as Pre-procedure Electronic Signature(s) Signed: 07/30/2022 5:58:31 PM By: Darren Pilling RN, BSN Signed: 07/31/2022 9:20:47 AM By: Darren Shan DO Entered By: [name] on 07/30/2022 16:39:49 -------------------------------------------------------------------------------- HPI Details Patient Name: Date of Service: [name] [name] 07/30/2022 3:45 PM Medical Record Number: [id number] Patient Account Number: [id number] Date of Birth/Sex: Treating RN: 1951/09/12 (71 y.o. 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 He is not sure how the wounds started. He has been following with podiatry for this issue. He had removal of the tendon to the right lower extremity by Dr. SBlenda Mounts He has been using Betadine to the wound beds. On 06/11/2022 he had a right posterior tibial artery angioplasty by Dr. CCarlis Abbott It was reported the patient is optimized after revascularization of the right lower extremity. His ABIs on the left were 0.91. He currently  denies systemic signs of infection. He also does not wear shoes. He came in with Kerlix wrap to his feet bilaterally. This did not completely cover his feet. 07/23/2022: This is a patient of Dr. HJodene Namthat she asked me to take a look at last week due to the significant involvement of the muscle and tendon on his right posterior leg wound. He needed an aggressive  debridement and asked me if I would be able to perform this on her behalf. The patient is here today for that procedure. When his dressing was removed in clinic today, the wound was teeming with [name]. There is necrotic muscle, tendon, and fat, with a thick layer of slough on the wound. 9/21; patient presents for follow-up. He was debrided by Dr. Celine Ahr at last clinic visit without any issues. He has been placing Dakin's wet-to-dry dressings to the right posterior wound. He has been using Medihoney to the left foot wound. He reports improvement in wound healing. He has no issues or complaints today. Electronic Signature(s) Signed: 07/31/2022 9:20:47 AM By: Darren Shan DO Entered By: [name] on 07/30/2022 16:54:51 -------------------------------------------------------------------------------- Physical Exam Details Patient Name: Date of Service: [name] [facility] A. 07/30/2022 3:45 PM Medical Record Number: [id number] Patient Account Number: [id number] Date of Birth/Sex: Treating RN: 1951-06-09 (71 y.o. [name] Primary Care Provider: Marlowe [name] Other Clinician: Referring Provider: Treating Provider/Extender: [name] Ngetich, [name] in Treatment: 2 Constitutional respirations regular, non-labored and within target range for patient.. Cardiovascular 2+ dorsalis pedis/posterior tibialis pulses. Psychiatric pleasant and cooperative. Notes Right lower extremity: T the posterior aspect there is granulation tissue and nonviable tissue. o Left lower extremity: T the plantar aspect of the  fifth met head there is an open wound with granulation tissue and minimal undermining. o No signs of surrounding infection tending to the wound beds. Venous stasis dermatitis and lymphedema skin changes bilaterally Electronic Signature(s) Signed: 07/31/2022 9:20:47 AM By: Darren Shan DO Entered By: [name] on 07/30/2022 17:05:15 -------------------------------------------------------------------------------- Physician Orders Details Patient Name: Date of Service: [name] [facility] A. 07/30/2022 3:45 PM Medical Record Number: [id number] Patient Account Number: [id number] Date of Birth/Sex: Treating RN: 10/26/1951 (71 y.o. 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 - Dr. Heber [place] Room 8 (Dr. Dellia Nims covering) Return A Other: - [facility]- will ship wound care supplies. Anesthetic (In clinic) Topical Lidocaine 5% applied to wound bed Bathing/ Shower/ Hygiene May shower with protection but do not get wound dressing(s) wet. Edema Control - Lymphedema / SCD / Other Elevate legs to the level of the heart or above for 30 minutes daily and/or when sitting, a frequency of: - 3-4 times a day throughout the day. Avoid standing for long periods of time. Off-Loading Other: - minimize walking and standing to aid in offloading pressure to wound. Wound Treatment Wound #1 -  Achilles Wound Laterality: Right Cleanser: Soap and Water 1 x Per Week/30 Days Discharge Instructions: May shower and wash wound with dial antibacterial soap and water prior to dressing change. Cleanser: Wound Cleanser 1 x Per Week/30 Days Discharge Instructions: Cleanse the wound with wound cleanser prior to applying a clean dressing using gauze sponges, not tissue or cotton balls. Peri-Wound Care: Sween Lotion (Moisturizing lotion) 1 x Per Week/30 Days Discharge Instructions: Apply moisturizing lotion as directed Prim Dressing: Hydrofera Blue Classic Foam, 4x4 in 1 x Per Week/30 Days  ary Discharge Instructions: Moisten with saline prior to applying to wound bed Prim Dressing: Santyl Ointment 1 x Per Week/30 Days ary Discharge Instructions: Apply nickel thick amount to wound bed as instructed Secondary Dressing: ABD Pad, 5x9 1 x Per Week/30 Days Discharge Instructions: Apply over primary dressing as directed. Secondary Dressing: Woven Gauze Sponge, Non-Sterile 4x4 in 1 x Per Week/30 Days Discharge Instructions: Apply over primary dressing as directed. Compression Wrap: ThreePress (3 layer compression wrap) 1 x Per Week/30 Days Discharge Instructions: Apply three layer compression as directed. Wound #2 - Foot Wound Laterality: Plantar, Left Cleanser: Soap and Water 1 x Per Day/30 Days Discharge Instructions: May shower and wash wound with dial antibacterial soap and water prior to dressing change. Cleanser: Wound Cleanser 1 x Per Day/30 Days Discharge Instructions: Cleanse the wound with wound cleanser prior to applying a clean dressing using gauze sponges, not tissue or cotton balls. Peri-Wound Care: Skin Prep 1 x Per Day/30 Days Discharge Instructions: Use skin prep as directed Prim Dressing: KerraCel Ag Gelling Fiber Dressing, 4x5 in (silver alginate) 1 x Per Day/30 Days ary Discharge Instructions: Apply silver alginate to wound bed as instructed Prim Dressing: MediHoney Gel, tube 1.5  (oz) 1 x Per Day/30 Days ary Discharge Instructions: Apply to wound bed as instructed Secondary Dressing: Optifoam Non-Adhesive Dressing, 4x4 in 1 x Per Day/30 Days Discharge Instructions: Apply over primary dressing as directed. Secondary Dressing: Woven Gauze Sponges 2x2 in 1 x Per Day/30 Days Discharge Instructions: Apply over primary dressing as directed. Secured With: [employer], 4.5x3.1 (in/yd) 1 x Per Day/30 Days Discharge Instructions: Secure with Kerlix as directed. Secured With: 30M Medipore H Soft Cloth Surgical T ape, 4 x 10 (in/yd) 1 x Per Day/30 Days Discharge Instructions: Secure with tape as directed. Electronic Signature(s) Signed: 07/31/2022 9:20:47 AM By: Darren Shan DO Entered By: [name] on 07/30/2022 17:07:40 -------------------------------------------------------------------------------- Problem List Details Patient Name: Date of Service: [name] [name] [facility] A. 07/30/2022 3:45 PM Medical Record Number: [id number] Patient Account Number: [id number] Date of Birth/Sex: Treating RN: May 04, 1951 (71 y.o. [name] Primary Care Provider: Marlowe [name] Other Clinician: Referring Provider: Treating Provider/Extender: [name], [name] in Treatment: 2 Active Problems ICD-10 Encounter Code Description Active Date MDM Diagnosis L97.518 Non-pressure chronic ulcer of other part of right foot with other specified 07/16/2022 No Yes severity L97.522 Non-pressure chronic ulcer of other part of left foot with fat layer exposed 07/16/2022 No Yes E11.621 Type 2 diabetes mellitus with foot ulcer 07/16/2022 No Yes I73.9 Peripheral vascular disease, unspecified 07/16/2022 No Yes 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 07/23/2022 No Yes muscle Inactive Problems Resolved Problems Electronic Signature(s) Signed: 07/31/2022 9:20:47 AM By: Darren Shan DO Entered By: [name] on  07/30/2022 16:53:49 -------------------------------------------------------------------------------- Progress Note Details Patient Name: Date of Service: [name] [name] 07/30/2022 3:45 PM Medical Record Number: [id number] Patient Account Number: [id number] Date of Birth/Sex: Treating RN: [birth year] (71 y.o. [name] Primary Care Provider: Marlowe [name] Other Clinician: Referring Provider: Treating Provider/Extender: [name], [name] in Treatment: 2 Subjective Chief Complaint Information obtained from Patient 07/16/2022; bilateral lower extremity wounds History of Present Illness (HPI) Admission 07/16/2022 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 He is not sure how the wounds started. He has been following with podiatry for this issue. He had removal of the tendon to the right lower extremity by Dr. SBlenda Mounts He has been using Betadine to the wound beds. On 06/11/2022 he had a right posterior tibial artery angioplasty by Dr. CCarlis Abbott It was reported the patient is optimized after revascularization of the right lower extremity. His ABIs on the left were 0.91. He currently denies systemic signs of infection. He also does not wear shoes. He came in with Kerlix wrap to his feet bilaterally. This did not completely cover his feet. 07/23/2022: This is a patient of Dr. HJodene Namthat she asked me to take a look at last week due to the significant involvement of the muscle and tendon on his right posterior leg wound. He needed an aggressive debridement and asked me if I would be able to perform this  on her behalf. The patient is here today for that procedure. When his dressing was removed in clinic today, the wound was teeming with [name]. There is necrotic muscle, tendon, and fat, with a thick layer of slough on the wound. 9/21; patient presents for follow-up. He was debrided by Dr. CCeline Ahrat last clinic visit without any issues. He has been placing Dakin's wet-to-dry dressings to the right posterior wound. He has been using Medihoney to the left foot wound. He reports improvement in wound healing. He has no issues or complaints today. Patient History Information obtained from Chart. Family History Diabetes - Mother, Heart Disease - Mother,Siblings. Social History Current every day smoker, Alcohol Use - Never, Drug Use - No History, Caffeine Use - Never. Medical History Respiratory Patient has history of Sleep Apnea - does not wear Cardiovascular Patient has history of Arrhythmia - A. Fib, Congestive Heart Failure - EF 25%, Hypertension, Peripheral Arterial Disease Denies history of Peripheral Venous Disease Endocrine Patient has history of Type II Diabetes Hospitalization/Surgery History - Angiogram 06/11/2022 Dr. CCarlis AbbottVVS. - cardioversion 11/06/2021, 2015, 2017. 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 Medical A Surgical History Notes nd Gastrointestinal Ascites Genitourinary stage III Kidney disease Objective Constitutional respirations regular, non-labored and within target range for patient.. Vitals Time Taken: 4:02 PM, Height: 68 in, Weight: 185 lbs, BMI: 28.1, Temperature: 98.3 F, Pulse: 109 bpm, Respiratory Rate: 20 breaths/min, Blood Pressure: 153/93 mmHg. Cardiovascular 2+ dorsalis pedis/posterior tibialis pulses. Psychiatric pleasant and cooperative. General Notes: Right lower extremity: T the posterior aspect there is granulation tissue and nonviable tissue. Left lower extremity: T the plantar aspect of the o o fifth met head there is an open wound with  granulation tissue and minimal undermining. No signs of surrounding infection tending to the wound beds. Venous stasis dermatitis and lymphedema skin changes bilaterally Integumentary (Hair, Skin) Wound #1 status is Open. Original cause of wound was Surgical Injury. The date acquired was: 07/16/2022. The wound has been in treatment 2 [name]. The wound is located on the Right Achilles. The wound measures 7cm length x 3.5cm width x 0.3cm depth; 19.242cm^2 area and 5.773cm^3 volume. There is Fat Layer (Subcutaneous Tissue) exposed. There is no tunneling or undermining noted. There is a medium amount of serosanguineous drainage noted. Foul odor after cleansing was noted. The wound margin is distinct with the outline attached to the wound base. There is small (1-33%) red, pink granulation within the wound bed. There is a  large (67-100%) amount of necrotic tissue within the wound bed including Adherent Slough. Wound #2 status is Open. Original cause of wound was Gradually Appeared. The date acquired was: 03/09/2022. The wound has been in treatment 2 [name]. The wound is located on the [place]. The wound measures 1cm length x 0.7cm width x 0.3cm depth; 0.55cm^2 area and 0.165cm^3 volume. There is Fat Layer (Subcutaneous Tissue) exposed. There is no tunneling noted, however, there is undermining starting at 6:00 and ending at 12:00 with a maximum distance of 0.1cm. There is a medium amount of serosanguineous drainage noted. The wound margin is distinct with the outline attached to the wound base. There is large (67-100%) pink, pale granulation within the wound bed. There is a small (1-33%) amount of necrotic tissue within the wound bed including Adherent Slough. Wound #3 status is Healed - Epithelialized. Original cause of wound was Gradually Appeared. The date acquired was: 07/13/2022. The wound has been in treatment 2 [name]. The wound is located on the Left T Fourth. The wound measures 0cm length x 0cm  width x 0cm depth; 0cm^2 area and 0cm^3 volume. oe There is Fat Layer (Subcutaneous Tissue) exposed. There is no tunneling or undermining noted. There is a medium amount of serosanguineous drainage noted. The wound margin is distinct with the outline attached to the wound base. There is large (67-100%) red granulation within the wound bed. There is no necrotic tissue within the wound bed. 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 I debrided nonviable tissue. ABI on the right is 1.11 and 1.07 on the left. He should have adequate blood flow for healing. At this time he would benefit from a compression wrap to the right lower extremity and we will place Santyl and Hydrofera Blue to the wound bed. The left foot wound I recommended continue with Medihoney but also adding silver alginate. The left fourth toe wound is healed. Follow-up in 1 week. Procedures Wound #1 Pre-procedure diagnosis of Wound #1 is a Diabetic Wound/Ulcer of the Lower Extremity located on the Right Achilles .Severity of Tissue Pre Debridement is: Fat layer exposed. There was a Excisional Skin/Subcutaneous Tissue Debridement with a total area of 24.5 sq cm performed by Darren Shan, DO. With the following instrument(s): Curette to remove Viable and Non-Viable tissue/material. Material removed includes Subcutaneous Tissue, Slough, Skin: Dermis, and Skin: Epidermis after achieving pain control using Lidocaine 5% topical ointment. A time out was conducted at 16:30, prior to the start of the procedure. A Minimum amount of  bleeding was controlled with Pressure. The procedure was tolerated well with a pain level of 0 throughout and a pain level of 0 following the procedure. Post Debridement Measurements: 7cm length x 3.5cm width x 0.3cm depth; 5.773cm^3 volume. Character of Wound/Ulcer Post Debridement is improved. Severity of Tissue Post Debridement is: Fat layer exposed. Post procedure Diagnosis Wound #1: Same as Pre-Procedure Pre-procedure diagnosis of Wound #1 is a Diabetic Wound/Ulcer of the Lower Extremity located on the Right Achilles . There was a Three Layer Compression Therapy Procedure by Darren Pilling, RN. Post procedure Diagnosis Wound #1: Same as Pre-Procedure Plan Follow-up Appointments: Return Appointment in  1 week. - Dr. Heber [place] Room 8 (Dr. Dellia Nims covering) Other: - [facility]- will ship wound care supplies. Anesthetic: (In clinic) Topical Lidocaine 5% applied to wound bed Bathing/ Shower/ Hygiene: May shower with protection but do not get wound dressing(s) wet. Edema Control - Lymphedema / SCD / Other: Elevate legs to the level of the heart or above for 30 minutes daily and/or when sitting, a frequency of: - 3-4 times a day throughout the day. Avoid standing for long periods of time. Off-Loading: Other: - minimize walking and standing to aid in offloading pressure to wound. WOUND #1: - Achilles Wound Laterality: Right Cleanser: Soap and Water 1 x Per Week/30 Days Discharge Instructions: May shower and wash wound with dial antibacterial soap and water prior to dressing change. Cleanser: Wound Cleanser 1 x Per Week/30 Days Discharge Instructions: Cleanse the wound with wound cleanser prior to applying a clean dressing using gauze sponges, not tissue or cotton balls. Peri-Wound Care: Sween Lotion (Moisturizing lotion) 1 x Per Week/30 Days Discharge Instructions: Apply moisturizing lotion as directed Prim Dressing: Hydrofera Blue Classic Foam, 4x4 in 1 x Per Week/30 Days ary Discharge Instructions:  Moisten with saline prior to applying to wound bed Prim Dressing: Santyl Ointment 1 x Per Week/30 Days ary Discharge Instructions: Apply nickel thick amount to wound bed as instructed Secondary Dressing: ABD Pad, 5x9 1 x Per Week/30 Days Discharge Instructions: Apply over primary dressing as directed. Secondary Dressing: Woven Gauze Sponge, Non-Sterile 4x4 in 1 x Per Week/30 Days Discharge Instructions: Apply over primary dressing as directed. Com pression Wrap: ThreePress (3 layer compression wrap) 1 x Per Week/30 Days Discharge Instructions: Apply three layer compression as directed. WOUND #2: - Foot Wound Laterality: Plantar, Left Cleanser: Soap and Water 1 x Per Day/30 Days Discharge Instructions: May shower and wash wound with dial antibacterial soap and water prior to dressing change. Cleanser: Wound Cleanser 1 x Per Day/30 Days Discharge Instructions: Cleanse the wound with wound cleanser prior to applying a clean dressing using gauze sponges, not tissue or cotton balls. Peri-Wound Care: Skin Prep 1 x Per Day/30 Days Discharge Instructions: Use skin prep as directed Prim Dressing: KerraCel Ag Gelling Fiber Dressing, 4x5 in (silver alginate) 1 x Per Day/30 Days ary Discharge Instructions: Apply silver alginate to wound bed as instructed Prim Dressing: MediHoney Gel, tube 1.5 (oz) 1 x Per Day/30 Days ary Discharge Instructions: Apply to wound bed as instructed Secondary Dressing: Optifoam Non-Adhesive Dressing, 4x4 in 1 x Per Day/30 Days Discharge Instructions: Apply over primary dressing as directed. Secondary Dressing: Woven Gauze Sponges 2x2 in 1 x Per Day/30 Days Discharge Instructions: Apply over primary dressing as directed. Secured With: [employer], 4.5x3.1 (in/yd) 1 x Per Day/30 Days Discharge Instructions: Secure with Kerlix as directed. Secured With: 2M Medipore H Soft Cloth Surgical T ape, 4 x 10 (in/yd) 1 x Per Day/30 Days Discharge Instructions: Secure with  tape as directed. 1. In office sharp debridement 2. Hydrofera Blue and Santyl under 3 layer compressionooright lower extremity 3. Silver alginate with Medihoney to the left foot wound 4. Follow-up in 1 week Electronic Signature(s) Signed: 07/31/2022 9:20:47 AM By: Darren Shan DO Entered By: [name] on 07/30/2022 17:08:27 -------------------------------------------------------------------------------- HxROS Details Patient Name: Date of Service: [name] [name] 07/30/2022 3:45 PM Medical Record Number: [id number] Patient Account Number: [id number] Date of Birth/Sex: Treating RN: August 01, 1951 (71 y.o. [name] Primary Care Provider: Marlowe [name] Other Clinician: Referring Provider: Treating Provider/Extender: [name], Darren  [name] in Treatment: 2 Information Obtained From Chart Respiratory Medical History: Positive for: Sleep Apnea - does not wear Cardiovascular Medical History: Positive for: Arrhythmia - A. Fib; Congestive Heart Failure - EF 25%; Hypertension; Peripheral Arterial Disease Negative for: Peripheral Venous Disease Gastrointestinal Medical History: Past Medical History Notes: Ascites Endocrine Medical History: Positive for: Type II Diabetes Time with diabetes: 6 years Treated with: Insulin Blood sugar tested every day: No Genitourinary Medical History: Past Medical History Notes: stage III Kidney disease Immunizations Pneumococcal Vaccine: Received Pneumococcal Vaccination: No Implantable Devices No devices added Hospitalization / Surgery History Type of Hospitalization/Surgery Angiogram 06/11/2022 Dr. 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 Family and Social History Diabetes: Yes - Mother; Heart Disease: Yes - Mother,Siblings; Current every day smoker; Alcohol Use: Never; Drug Use: No History; Caffeine Use: Never; Financial Concerns: No; Food, Clothing or Shelter  Needs: No; Support System Lacking: No; Transportation Concerns: No Electronic Signature(s) Signed: 07/30/2022 5:58:31 PM By: Darren Pilling RN, BSN Signed: 07/31/2022 9:20:47 AM By: Darren Shan DO Entered By: [name] on 07/30/2022 16:54:57 -------------------------------------------------------------------------------- SuperBill Details Patient Name: Date of Service: [name] [facility] A. 07/30/2022 Medical Record Number: [id number] Patient Account Number: [id number] Date of Birth/Sex: Treating RN: 04-30-1951 (71 y.o. 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 Facility Procedures CPT4 Code: [id number] Description: 74081 - DEB SUBQ TISSUE 20 SQ CM/< ICD-10 Diagnosis Description 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 m Modifier: uscle Quantity: 1 CPT4 Code: [id number] Description: 11045 - DEB SUBQ TISS EA ADDL 20CM ICD-10 Diagnosis Description 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 m Modifier: uscle Quantity: 1 Physician Procedures : CPT4 Code Description Modifier [id number] 37858 - WC PHYS SUBQ TISS 20 SQ CM ICD-10 Diagnosis Description 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

## 2022-08-01 ENCOUNTER — Encounter (HOSPITAL_COMMUNITY): Payer: Self-pay | Admitting: Emergency Medicine

## 2022-08-01 ENCOUNTER — Other Ambulatory Visit: Payer: Self-pay

## 2022-08-01 ENCOUNTER — Ambulatory Visit (HOSPITAL_COMMUNITY)
Admission: EM | Admit: 2022-08-01 | Discharge: 2022-08-01 | Disposition: A | Discharge status: Home / Self Care | Payer: Medicare HMO | Attending: Family Medicine | Admitting: Family Medicine

## 2022-08-01 DIAGNOSIS — M7989 Other specified soft tissue disorders: Secondary | ICD-10-CM | POA: Diagnosis not present

## 2022-08-01 DIAGNOSIS — S81801A Unspecified open wound, right lower leg, initial encounter: Secondary | ICD-10-CM

## 2022-08-01 NOTE — ED Triage Notes (Signed)
Patient has adult daughter/caregiver in treatment room.  Reports she does dressing changes to lower legs.  Patient reports wound care applied dressing and she is concerned about swelling and coloration of leg.  Lower leg is red, more swelling to top of lower leg than at ankle.  Dr hagler at bedside and requested dressing to be removed.  Dr hagler requested applying dressing.  Patient had materials we did not have in our supply for wrapping next to skin.  Used patient supplies and covered with ace wrap

## 2022-08-03 NOTE — ED Provider Notes (Signed)
[place]   [id number] 08/01/22 Arrival Time: [place]:  1. Swelling of lower leg   2. Open wound of right lower leg, initial encounter    RE-wrapped. To schedule prompt f/u with wound care clinic.    Reviewed expectations re: course of current medical issues. Questions answered. Outlined signs and symptoms indicating need for more acute intervention. Understanding verbalized. After Visit Summary given.   SUBJECTIVE: History from: Patient. [name] is a 71 y.o. male. Patient has adult daughter/caregiver in treatment room.  Reports she does dressing changes to lower legs.  Patient reports wound care applied dressing and she is concerned about swelling and coloration of leg.  Lower leg is red, more swelling to top of lower leg than at ankle. "Just feel like it's wrapped too tightly". Otherwise well.  OBJECTIVE:  Vitals:   08/01/22 1833 08/01/22 1836  BP: (!) 186/76 (!) 171/80  Pulse: 68   Resp: 20   Temp: 98.1 F (36.7 C)   TempSrc: Oral   SpO2: 96%     General appearance: alert; no distress Extremities: edema of RLE; post heel chronic wound Skin: warm and dry Neurologic: normal gait Psychological: alert and cooperative; normal mood and affect  Labs:   No Known Allergies  Past Medical History:  Diagnosis Date   A-fib ([place])    a. (06/04/14) TEE-DC-CV; succesful; large LA 6.2 cm   ADHD    Ascites    Athlete's foot    Chronic systolic heart failure (HCC)    a. 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, unspecified CKD stage, unspecified whether long term insulin use (HCC)    Heart murmur    HTN (hypertension)    OSA (obstructive sleep apnea)    PVD (peripheral vascular disease) (HCC)    s/p great toe amputation   Social  History   Socioeconomic History   Marital status: Single    Spouse name: Not on file   Number of children: Not on file   Years of education: Not on file   Highest education level: Not on file  Occupational History   Occupation: retired  Tobacco Use   Smoking status: Some Days    Years: 52.00    Types: Cigarettes, Cigars    Last attempt to quit: 05/2021    Years since quitting: 1.2    Passive exposure: Never   Smokeless tobacco: Never  Vaping Use   Vaping Use: Never used  Substance and Sexual Activity   Alcohol use: Not Currently   Drug use: Never   Sexual activity: Not on file  Other Topics Concern   Not on file  Social History Narrative   ** Merged History Encounter ** Lives in [place] by himself. Retired from [employer] and [employer] for [employer].       Tobacco use, amount per day now:   Past tobacco use, amount per day:   How many years did you use tobacco:   Alcohol use (drinks per week): N/A   Diet:   Do you drink/eat things with caffeine:   Marital status:  Divorced  What year were you married? 2003   Do you live in a house, apartment, assisted living, condo, trailer, etc.? House   Is it one or more stories? One   How many persons live in your home?   Do you have pets in your home?( please list) N/A   Highest Level of education completed? Bachelors Degree   Current or past profession: Teacher-Special Ed   Do you exercise?  Yes                                Type and how often? Daily squats, and push ups.   Do you have a living will? No   Do you have a DNR form?  No                                 If not, do you want to discuss one?   Do you have signed POA/HPOA forms?  No                      If so, please bring to you appointment      Do you have any difficulty bathing or dressing yourself? Bathing only if pants not shirts/not shorts.   Do you have any difficulty preparing food or eating? No   Do you have any difficulty  managing your medications? No   Do you have any difficulty managing your finances? No   Do you have any difficulty affording your medications? No   Social Determinants of Health   Financial Resource Strain: Not on file  Food Insecurity: No Food Insecurity (11/10/2021)   Hunger Vital Sign    Worried About Running Out of Food in the Last Year: Never true    Ran Out of Food in the Last Year: Never true  Transportation Needs: No Transportation Needs (11/10/2021)   PRAPARE - Hydrologist (Medical): No    Lack of Transportation (Non-Medical): No  Physical Activity: Not on file  Stress: Not on file  Social Connections: Not on file  Intimate Partner Violence: Not on file   Family History  Problem Relation Age of Onset   Heart attack Mother        deceased   Diabetes Mother    Heart attack Sister    Past Surgical History:  Procedure Laterality Date   ABDOMINAL AORTOGRAM W/LOWER EXTREMITY N/A 05/19/2021   Procedure: ABDOMINAL AORTOGRAM W/LOWER EXTREMITY;  Surgeon: Marty Heck, MD;  Location: [place] [facility];  Service: Cardiovascular;  Laterality: N/A;   ABDOMINAL AORTOGRAM W/LOWER EXTREMITY Right 06/11/2022   Procedure: ABDOMINAL AORTOGRAM W/LOWER EXTREMITY;  Surgeon: Marty Heck, MD;  Location: [place] [facility];  Service: Cardiovascular;  Laterality: Right;   AMPUTATION Left 05/22/2021   Procedure: LEFT FIFTH TOE AMPUTATION;  Surgeon: Marty Heck, MD;  Location: [place];  Service: Vascular;  Laterality: Left;   CARDIOVERSION N/A 06/04/2014   Procedure: CARDIOVERSION;  Surgeon: Larey Dresser, MD;  Location: [facility] ENDOSCOPY;  Service: Cardiovascular;  Laterality: N/A;   CARDIOVERSION N/A 11/06/2021   Procedure: CARDIOVERSION;  Surgeon: Larey Dresser, MD;  Location: [facility] ENDOSCOPY;  Service: Cardiovascular;  Laterality: N/A;   NOSE SURGERY     Nasal septum surgery   PERIPHERAL VASCULAR BALLOON ANGIOPLASTY  06/11/2022   Procedure: PERIPHERAL  VASCULAR BALLOON ANGIOPLASTY;  Surgeon: Marty Heck, MD;  Location: [place] [facility];  Service: Cardiovascular;;   PERIPHERAL VASCULAR INTERVENTION Left 05/19/2021   Procedure: PERIPHERAL VASCULAR INTERVENTION;  Surgeon: Marty Heck, MD;  Location: [place] [facility];  Service: Cardiovascular;  Laterality: Left;   TEE WITHOUT CARDIOVERSION N/A 06/04/2014   Procedure: TRANSESOPHAGEAL ECHOCARDIOGRAM (TEE);  Surgeon: Larey Dresser, MD;  Location: [place];  Service: Cardiovascular;  Laterality: N/A;   TEE WITHOUT CARDIOVERSION N/A 01/06/2016   Procedure: TRANSESOPHAGEAL ECHOCARDIOGRAM (TEE);  Surgeon: Fay Records, MD;  Location: [place];  Service: Cardiovascular;  Laterality: N/A;   TEE WITHOUT CARDIOVERSION N/A 11/06/2021   Procedure: TRANSESOPHAGEAL ECHOCARDIOGRAM (TEE);  Surgeon: Larey Dresser, MD;  Location: [facility] ENDOSCOPY;  Service: Cardiovascular;  Laterality: N/A;   Lady Deutscher, MD 08/03/22 1308

## 2022-08-06 ENCOUNTER — Encounter (HOSPITAL_BASED_OUTPATIENT_CLINIC_OR_DEPARTMENT_OTHER): Payer: Medicare HMO | Admitting: Internal Medicine

## 2022-08-06 ENCOUNTER — Encounter (HOSPITAL_COMMUNITY): Payer: Self-pay | Admitting: Internal Medicine

## 2022-08-06 ENCOUNTER — Ambulatory Visit (HOSPITAL_COMMUNITY)
Admission: RE | Admit: 2022-08-06 | Discharge: 2022-08-06 | Disposition: A | Discharge status: Home / Self Care | Payer: Medicare HMO | Source: Ambulatory Visit | Attending: Internal Medicine | Admitting: Internal Medicine

## 2022-08-06 VITALS — BP 134/84 | HR 99 | Wt 205.2 lb

## 2022-08-06 DIAGNOSIS — E1122 Type 2 diabetes mellitus with diabetic chronic kidney disease: Secondary | ICD-10-CM | POA: Diagnosis not present

## 2022-08-06 DIAGNOSIS — L97413 Non-pressure chronic ulcer of right heel and midfoot with necrosis of muscle: Secondary | ICD-10-CM | POA: Diagnosis not present

## 2022-08-06 DIAGNOSIS — E669 Obesity, unspecified: Secondary | ICD-10-CM | POA: Insufficient documentation

## 2022-08-06 DIAGNOSIS — I739 Peripheral vascular disease, unspecified: Secondary | ICD-10-CM | POA: Diagnosis not present

## 2022-08-06 DIAGNOSIS — Z7901 Long term (current) use of anticoagulants: Secondary | ICD-10-CM | POA: Diagnosis not present

## 2022-08-06 DIAGNOSIS — I2510 Atherosclerotic heart disease of native coronary artery without angina pectoris: Secondary | ICD-10-CM

## 2022-08-06 DIAGNOSIS — Z7902 Long term (current) use of antithrombotics/antiplatelets: Secondary | ICD-10-CM | POA: Diagnosis not present

## 2022-08-06 DIAGNOSIS — F17290 Nicotine dependence, other tobacco product, uncomplicated: Secondary | ICD-10-CM | POA: Diagnosis not present

## 2022-08-06 DIAGNOSIS — I5022 Chronic systolic (congestive) heart failure: Secondary | ICD-10-CM | POA: Diagnosis not present

## 2022-08-06 DIAGNOSIS — F32A Depression, unspecified: Secondary | ICD-10-CM | POA: Diagnosis not present

## 2022-08-06 DIAGNOSIS — L97518 Non-pressure chronic ulcer of other part of right foot with other specified severity: Secondary | ICD-10-CM | POA: Diagnosis not present

## 2022-08-06 DIAGNOSIS — N1831 Chronic kidney disease, stage 3a: Secondary | ICD-10-CM | POA: Diagnosis not present

## 2022-08-06 DIAGNOSIS — M7989 Other specified soft tissue disorders: Secondary | ICD-10-CM | POA: Diagnosis not present

## 2022-08-06 DIAGNOSIS — Z7984 Long term (current) use of oral hypoglycemic drugs: Secondary | ICD-10-CM | POA: Insufficient documentation

## 2022-08-06 DIAGNOSIS — E1151 Type 2 diabetes mellitus with diabetic peripheral angiopathy without gangrene: Secondary | ICD-10-CM | POA: Diagnosis not present

## 2022-08-06 DIAGNOSIS — Z79899 Other long term (current) drug therapy: Secondary | ICD-10-CM | POA: Diagnosis not present

## 2022-08-06 DIAGNOSIS — E11621 Type 2 diabetes mellitus with foot ulcer: Secondary | ICD-10-CM | POA: Diagnosis not present

## 2022-08-06 DIAGNOSIS — Z91148 Patient's other noncompliance with medication regimen for other reason: Secondary | ICD-10-CM | POA: Diagnosis not present

## 2022-08-06 DIAGNOSIS — I480 Paroxysmal atrial fibrillation: Secondary | ICD-10-CM | POA: Insufficient documentation

## 2022-08-06 DIAGNOSIS — I130 Hypertensive heart and chronic kidney disease with heart failure and stage 1 through stage 4 chronic kidney disease, or unspecified chronic kidney disease: Secondary | ICD-10-CM | POA: Diagnosis not present

## 2022-08-06 DIAGNOSIS — L97522 Non-pressure chronic ulcer of other part of left foot with fat layer exposed: Secondary | ICD-10-CM | POA: Diagnosis not present

## 2022-08-06 DIAGNOSIS — N183 Chronic kidney disease, stage 3 unspecified: Secondary | ICD-10-CM | POA: Diagnosis not present

## 2022-08-06 DIAGNOSIS — E11622 Type 2 diabetes mellitus with other skin ulcer: Secondary | ICD-10-CM | POA: Diagnosis not present

## 2022-08-06 DIAGNOSIS — E1142 Type 2 diabetes mellitus with diabetic polyneuropathy: Secondary | ICD-10-CM | POA: Diagnosis not present

## 2022-08-06 DIAGNOSIS — G4733 Obstructive sleep apnea (adult) (pediatric): Secondary | ICD-10-CM | POA: Insufficient documentation

## 2022-08-06 DIAGNOSIS — L97312 Non-pressure chronic ulcer of right ankle with fat layer exposed: Secondary | ICD-10-CM | POA: Diagnosis not present

## 2022-08-06 NOTE — Progress Notes (Signed)
Patient ID: [name], male   DOB: [birth year], 71 y.o.   MRN: [id number]    Advanced Heart Failure Clinic Note   PCP: [facility] Primary Cardiologist: Dr. Claiborne Billings  HPI: [name] is a 71 y.o. male with PAF, severe depression, DM2, CKD stage III, obesity, OSA and chronic systolic HF.  Admitted 6/30-05/16/14 with dyspena and LE edema. Found to be in afib with RVR. Diuresed with IV lasix and d/c weight 234 lbs.   Admitted 2/18 -01/10/2016 wit volume overload/Afib RVR  in the setting of medication compliance. Diuresed with IV lasix and transitioned to lasix 80 mg twice a day. Had TEE but  DC-CV was cancelled due to LA thrombus. Discharge weight 189 pounds.   Admitted 03/14/2016 with  A fib RVR and volume overload in the setting of medication noncompliance. Refused cath, Myoview 12/2015. EF 20% with defects noted on rest and stress images within the inferior wall and to lesser extent the septum suggest ischemic etiology. No reversibility. Restarted on HF meds. D/C with volume overload. Psychiatry met with him and he was deemed to have the ability to make decisions. Diuresed with IV lasix and transitioned to lasix 80 mg twice a day. Discharge weight was 216 pounds.   Echo 5/17 EF 15-20%  Seen in the HF clinic in 2021. EF had normalized.    Admitted 10/28/21 with increased shortness of breath and marked volume overload.  Admit weight 231 pounds. His medications were being delivered in bubbles packs and he was taking metoprolol once a day instead of twice a day and his diuretic regimen was lower. Echo repeated EF 45-50%. Placed on IV lasix and diuresed > 19 liters. Transitioned to lasix 20 mg daily. Hospital course complicated by AKI and A fib. Underwent TEE-DC-CV with conversion to NSR. Beta blocker held due to bradycardia. He was continued on amiodarone 200 mg daily. He was back in A fib on discharge.  Discharge weight 203 pounds.   Admitted 12/11/21 after mechanical fall resulting  in rib fracture 5-7 and PTX.  AC stopped due to risk of fall. Discharged 12/15/21.   Follow up 2/23, NYHA II-III, volume ok. Warfarin resumed.  In 8/23 had critical limb ischemia of RLE with non-healing ulcer -> underwent right posterior tibial angioplasty by Dr. Carlis Abbott   Today he returns for HF follow up with his daughter. Continues to deal with RLE wound and swelling. Has been following with the wound clinic for about a month. About a week ago started taking an extra dose of lasix to try to reduce LE swelling. No swelling anywhere else. Denies SOB, orthopnea or PND. Compliant with meds. Still smoking at least 20 cigars per day. Remains on warfarin and Plavix.    Cardiac Testing  - TEE (12/22): EF 35% No PFO . No thrombus.  - DC-CV (12/22)--> NSR  - Echo (12/22): EF 45-50% - Echo (2021): EF 55-60 - Echo (5/17): EF 15-20%  - Myoview 2/17 Perfusion: Next defects noted on rest and stress images within the inferior wall and to lesser extent the septum. No reversible defects. Wall Motion: Severe generalized hypokinesia. Left ventricle is dilute Left Ventricular Ejection Fraction: 20 % End diastolic volume 767 ml End systolic volume 209 ml IMPRESSION: 1. 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 No evidence of ischemia. 2. Severe generalized hypokinesia with dilated left ventricle. 3. Left ventricular ejection fraction 20% 4. High-risk stress test findings*.  ROS: All systems negative except as listed  in HPI, PMH and Problem List.  SH:  Social History   Socioeconomic History   Marital status: Single    Spouse name: Not on file   Number of children: Not on file   Years of education: Not on file   Highest education level: Not on file  Occupational History   Occupation: retired  Tobacco Use   Smoking status: Some Days    Years: 52.00    Types: Cigarettes, Cigars    Last attempt to quit: 05/2021    Years since quitting: 1.2    Passive exposure: Never    Smokeless tobacco: Never  Vaping Use   Vaping Use: Never used  Substance and Sexual Activity   Alcohol use: Not Currently   Drug use: Never   Sexual activity: Not on file  Other Topics Concern   Not on file  Social History Narrative   ** Merged History Encounter ** Lives in [place] by himself. Retired from [employer] and [employer] for [employer].       Tobacco use, amount per day now:   Past tobacco use, amount per day:   How many years did you use tobacco:   Alcohol use (drinks per week): N/A   Diet:   Do you drink/eat things with caffeine:   Marital status:  Divorced                                What year were you married? 2003   Do you live in a house, apartment, assisted living, condo, trailer, etc.? House   Is it one or more stories? One   How many persons live in your home?   Do you have pets in your home?( please list) N/A   Highest Level of education completed? Bachelors Degree   Current or past profession: Teacher-Special Ed   Do you exercise?  Yes                                Type and how often? Daily squats, and push ups.   Do you have a living will? No   Do you have a DNR form?  No                                 If not, do you want to discuss one?   Do you have signed POA/HPOA forms?  No                      If so, please bring to you appointment      Do you have any difficulty bathing or dressing yourself? Bathing only if pants not shirts/not shorts.   Do you have any difficulty preparing food or eating? No   Do you have any difficulty managing your medications? No   Do you have any difficulty managing your finances? No   Do you have any difficulty affording your medications? No   Social Determinants of Health   Financial Resource Strain: Not on file  Food Insecurity: No Food Insecurity (11/10/2021)   Hunger Vital Sign    Worried About Running Out of Food in the Last Year: Never true    Ran Out of Food in the Last Year: Never true  Transportation  Needs: No Transportation Needs (11/10/2021)   PRAPARE -  Hydrologist (Medical): No    Lack of Transportation (Non-Medical): No  Physical Activity: Not on file  Stress: Not on file  Social Connections: Not on file  Intimate Partner Violence: Not on file   FH:  Family History  Problem Relation Age of Onset   Heart attack Mother        deceased   Diabetes Mother    Heart attack Sister    Past Medical History:  Diagnosis Date   A-fib [facility])    a. (06/04/14) TEE-DC-CV; succesful; large LA 6.2 cm   ADHD    Ascites    Athlete's foot    Chronic systolic heart failure ([place])    a. 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, unspecified CKD stage, unspecified whether long term insulin use (HCC)    Heart murmur    HTN (hypertension)    OSA (obstructive sleep apnea)    PVD (peripheral vascular disease) (HCC)    s/p great toe amputation   Current Outpatient Medications  Medication Sig Dispense Refill   APPLE CIDER VINEGAR PO Take 30 mLs by mouth 4 (four) times a week.     clopidogrel (PLAVIX) 75 MG tablet Take 1 tablet (75 mg total) by mouth daily. 30 tablet 11   diclofenac Sodium (VOLTAREN) 1 % GEL Apply 1 Application topically 4 (four) times daily as needed (pain).     empagliflozin (JARDIANCE) 10 MG TABS tablet TAKE 1 TABLET(10 MG) BY MOUTH DAILY 30 tablet 5   ENTRESTO 49-51 MG TAKE 1 TABLET BY MOUTH TWICE DAILY 180 tablet 2   furosemide (LASIX) 20 MG tablet Take 1 tablet by mouth 2 (two) times daily. May take an additional tablet daily if needed. 90 tablet 0   insulin glargine (LANTUS) 100 UNIT/ML injection INJECT 8 UNITS UNDER THE SKIN DAILY 10 mL 11   Insulin Syringe-Needle U-100 (INSULIN SYRINGE 1CC/30GX5/16") 30G X 5/16" 1 ML MISC Use once daily for insulin  injections. Dx:E11.22 100 each 1   methocarbamol (ROBAXIN) 500 MG tablet Take 1 tablet (500 mg total) by mouth every 8 (eight) hours as needed for muscle spasms. 50 tablet 1   metoprolol succinate (TOPROL XL) 25 MG 24 hr tablet Take 1 tablet (25 mg total) by mouth daily. 30 tablet 11   Misc Natural Products (JOINT SUPPORT PO) Take 1 Dose by mouth 3 (three) times a week.     spironolactone (ALDACTONE) 25 MG tablet Take 0.5 tablets (12.5 mg total) by mouth daily. 45 tablet 3   traMADol (ULTRAM) 50 MG tablet Take 1 tablet (50 mg total) by mouth every 6 (six) hours as needed for moderate pain. 20 tablet 0   warfarin (COUMADIN) 6 MG tablet Take 0.5-1 tablets (3-6 mg total) by mouth See admin instructions. Take 6 mg on Mon and Fri Take 3 mg on Sun, Tues, Wed, Thurs, and Sat 30 tablet 6   blood glucose meter kit and supplies Dispense based on patient and insurance preference. Use up to four times daily as directed. (FOR ICD-10 E10.9, E11.9). (Patient not taking: Reported on 08/06/2022) 1 each 0   No current facility-administered medications for this encounter.   BP 134/84   Pulse 99   Wt 93.1 kg (205 lb 3.2 oz)   SpO2  97%   BMI 31.20 kg/m   Wt Readings from Last 3 Encounters:  08/06/22 93.1 kg (205 lb 3.2 oz)  07/21/22 92.5 kg (204 lb)  06/18/22 92.8 kg (204 lb 9.6 oz)   PHYSICAL EXAM: General:  Well appearing. No resp difficulty HEENT: normal Neck: supple. no JVD. Carotids 2+ bilat; no bruits. No lymphadenopathy or thryomegaly appreciated. Cor: PMI nondisplaced. Regular rate & rhythm. No rubs, gallops or murmurs. Lungs: clear Abdomen: soft, nontender, nondistended. No hepatosplenomegaly. No bruits or masses. Good bowel sounds. Extremities: no cyanosis, clubbing, rash, edema RLE wrapped with 1+ edema  malodorous drainage on dressing Neuro: alert & orientedx3, cranial nerves grossly intact. moves all 4 extremities w/o difficulty. Affect pleasant  ASSESSMENT & PLAN:  1. Chronic HFmEF -  Admitted 12/22 with marked volume overload. Not taking medications as prescribed. - Myoview 12/2015. EF 20% with defects noted on rest and stress images within the inferior wall and to lesser extent the septum suggest ischemic etiology. No reversibility. Refused cath multiple times. No s/s angina currently. HsTrop not checked on admit. ECG with nonspecific inferolateral TW abnormalities - Echo (10/21): EF 55-60%. - Echo (12/22): EF 45-50%, RV moderately reduced, RVSP 85 mmHg - Stable NYHA II-III. Volume status ok - Continue Toprol XL 25 mg daily. - Continue Lasix 20 mg bid. - Continue Entresto 49/51 mg bid. - Continue Jardiance 10 mg daily. - Continue spironolactone 12.5 mg daily.   2. CKD Stage IIIa-IIIb - Baseline Creatinine 1.5-1.7  - Mist recent labs 0n 06/18/22 Scr 1.9 - Will need repeat soon but left clinic in a hurry today to get to wound care appt  3. DMII - Hgb A1C 6.8. On SSI - Continue Jardiance   4. HTN - Blood pressure well controlled. Continue current regimen.   5. 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  - Continue warfarin. Followed by  Coumadin Clinic. - Rate ok today - ContinueToprol as above.   6.  H/o LV Thrombus - Resolved - On warfarin for AF/AFL  7. PVD - Followed by vascular and podiatry for right heel wound - ABIs (6/23) R moderate, L mild - Wound seems infected. He has f/u today in [facility]. Encouraged him to attend.    Glori Bickers MD 3:25 PM

## 2022-08-09 ENCOUNTER — Other Ambulatory Visit: Payer: Self-pay | Admitting: Family

## 2022-08-11 ENCOUNTER — Ambulatory Visit: Payer: Medicare HMO | Attending: Cardiovascular Disease

## 2022-08-11 NOTE — Progress Notes (Signed)
[name], [name] Visit Report for 08/06/2022 Arrival Information Details Patient Name: Date of Service: [name] [facility] A. 08/06/2022 3:45 PM Medical Record Number: [id number] Patient Account Number: [id number] Date of Birth/Sex: Treating RN: Feb 04, 1951 (71 y.Darren. [name] Primary Care [place]: [name] Other Clinician: Referring Bennette Hasty: Treating Niranjan Rufener/Extender: Kathrene Alu in Treatment: 3 Visit Information History Since Last Visit Added or deleted any medications: No Patient Arrived: [name] Any new allergies or adverse reactions: No Arrival Time: 16:21 Had a fall or experienced change in No Accompanied By: daughter activities of daily living that may affect Transfer Assistance: None risk of falls: Patient Identification Verified: Yes Signs or symptoms of abuse/neglect since last visito No Secondary Verification Process Completed: Yes Hospitalized since last visit: No Patient Requires Transmission-Based Precautions: No Implantable device outside of the clinic excluding No Patient Has Alerts: Yes cellular tissue based products placed in the center Patient Alerts: Patient on Blood Thinner since last visit: Has Dressing in Place as Prescribed: Yes Pain Present Now: No Electronic Signature(s) Signed: 08/11/2022 5:11:51 PM By: Sharyn Creamer RN, BSN Entered By: Sharyn Creamer on 08/06/2022 16:22:11 -------------------------------------------------------------------------------- Encounter Discharge Information Details Patient Name: Date of Service: [name] [facility]. 08/06/2022 3:45 PM Medical Record Number: [id number] Patient Account Number: [id number] Date of Birth/Sex: Treating RN: May 10, 1951 (41 y.Darren. [name] Primary Care Onelia Cadmus: [name] Other Clinician: Referring Desyre Calma: Treating Senita Corredor/Extender: Kathrene Alu in Treatment: 3 Encounter Discharge Information Items  Post Procedure Vitals Discharge Condition: Stable Temperature (F): 98.2 Ambulatory Status: Cane Pulse (bpm): 101 Discharge Destination: Home Respiratory Rate (breaths/min): 18 Transportation: Private Auto Blood Pressure (mmHg): 128/81 Accompanied By: daughter Schedule Follow-up Appointment: Yes Clinical Summary of Care: Patient Declined Electronic Signature(s) Signed: 08/11/2022 5:11:51 PM By: Sharyn Creamer RN, BSN Entered By: Sharyn Creamer on 08/06/2022 16:43:10 -------------------------------------------------------------------------------- Lower Extremity Assessment Details Patient Name: Date of Service: [name] [name]. 08/06/2022 3:45 PM Medical Record Number: [id number] Patient Account Number: [id number] Date of Birth/Sex: Treating RN: January 09, 1951 (60 y.Darren. [name] Primary Care Vernel Donlan: [name] Other Clinician: Referring Kyanna Mahrt: Treating Lesslie Mckeehan/Extender: Kathrene Alu in Treatment: 3 Edema Assessment Assessed: [name]: Yes] [Right: Yes] Edema: [Left: Yes] [Right: Yes] Calf Left: Right: Point of Measurement: 39 cm From Medial Instep 37.5 cm 47.5 cm Ankle Left: Right: Point of Measurement: 8 cm From Medial Instep 25.5 cm 27 cm Vascular Assessment Pulses: Dorsalis Pedis Palpable: [Left:Yes] [Right:Yes] Posterior Tibial Palpable: [Left:Yes] [Right:Yes] Electronic Signature(s) Signed: 08/11/2022 5:11:51 PM By: Sharyn Creamer RN, BSN Entered By: Sharyn Creamer on 08/06/2022 16:14:12 -------------------------------------------------------------------------------- Multi Wound Chart Details Patient Name: Date of Service: [name] [name] A. 08/06/2022 3:45 PM Medical Record Number: [id number] Patient Account Number: [id number] Date of Birth/Sex: Treating RN: 1951-08-22 (70 y.Darren. M) Primary Care Ellan Tess: [name] Other Clinician: Referring Shevelle Smither: Treating Xoey Warmoth/Extender: [name],  [name] in Treatment: 3 Vital Signs Height(in): 68 Pulse(bpm): 101 Weight(lbs): 185 Blood Pressure(mmHg): 128/81 Body Mass Index(BMI): 28.1 Temperature(F): 98.2 Respiratory Rate(breaths/min): 17 Photos: [1:Right Achilles] [2:Left, Plantar Foot] [N/A:N/A N/A] Wound Location: [1:Surgical Injury] [2:Gradually Appeared] [N/A:N/A] Wounding Event: [1:Diabetic Wound/Ulcer of the Lower] [2:Diabetic Wound/Ulcer of the Lower] [N/A:N/A] Primary Etiology: [1:Extremity Sleep Apnea, Arrhythmia, Congestive Sleep Apnea, Arrhythmia, Congestive N/A] [2:Extremity] Comorbid History: [1:Heart Failure, Hypertension, Peripheral Heart Failure, Hypertension, Peripheral Arterial Disease, Type II Diabetes 07/16/2022] [2:Arterial Disease, Type II Diabetes 03/09/2022] [N/A:N/A] Date Acquired: [1:3] [2:3] [N/A:N/A] [name] of Treatment: [1:Open] [2:Open] [  N/A:N/A] Wound Status: [1:No] [2:No] [N/A:N/A] Wound Recurrence: [1:6.5x3.5x0.3] [2:0.7x0.5x0.5] [N/A:N/A] Measurements L x W x D (cm) [1:17.868] [2:0.275] [N/A:N/A] A (cm) : rea [1:5.36] [2:0.137] [N/A:N/A] Volume (cm) : [1:12.50%] [2:-25.00%] [N/A:N/A] % Reduction in A rea: [1:47.50%] [2:-107.60%] [N/A:N/A] % Reduction in Volume: [2:6] Starting Position 1 (Darren'clock): [2:2] Ending Position 1 (Darren'clock): [2:0.5] Maximum Distance 1 (cm): [1:No] [2:Yes] [N/A:N/A] Undermining: [1:Grade 2] [2:Grade 2] [N/A:N/A] Classification: [1:Large] [2:Medium] [N/A:N/A] Exudate A mount: [1:Purulent] [2:Serosanguineous] [N/A:N/A] Exudate Type: [1:yellow, brown, green] [2:red, brown] [N/A:N/A] Exudate Color: [1:Yes] [2:No] [N/A:N/A] Foul Odor A Cleansing: [1:fter No] [2:N/A] [N/A:N/A] Odor A nticipated Due to Product Use: [1:Distinct, outline attached] [2:Distinct, outline attached] [N/A:N/A] Wound Margin: [1:Small (1-33%)] [2:Large (67-100%)] [N/A:N/A] Granulation A mount: [1:Red, Pink] [2:Pink, Pale] [N/A:N/A] Granulation Quality: [1:Large (67-100%)] [2:Small (1-33%)]  [N/A:N/A] Necrotic A mount: [1:Fat Layer (Subcutaneous Tissue): Yes Fat Layer (Subcutaneous Tissue): Yes N/A] Exposed Structures: [1:Fascia: No Tendon: No Muscle: No Joint: No Bone: No Small (1-33%)] [2:Fascia: No Tendon: No Muscle: No Joint: No Bone: No None] [N/A:N/A] Epithelialization: [1:Debridement - Excisional] [2:N/A] [N/A:N/A] Debridement: Pre-procedure Verification/Time Out 16:24 [2:N/A] [N/A:N/A] Taken: [1:Lidocaine 4% Topical Solution] [2:N/A] [N/A:N/A] Pain Control: [1:Subcutaneous, Slough] [2:N/A] [N/A:N/A] Tissue Debrided: [1:Skin/Subcutaneous Tissue] [2:N/A] [N/A:N/A] Level: [1:22.75] [2:N/A] [N/A:N/A] Debridement A (sq cm): [1:rea Curette] [2:N/A] [N/A:N/A] Instrument: [1:Minimum] [2:N/A] [N/A:N/A] Bleeding: [1:Pressure] [2:N/A] [N/A:N/A] Hemostasis A chieved: [1:0] [2:N/A] [N/A:N/A] Procedural Pain: [1:0] [2:N/A] [N/A:N/A] Post Procedural Pain: [1:Procedure was tolerated well] [2:N/A] [N/A:N/A] Debridement Treatment Response: [1:6.5x3.5x0.3] [2:N/A] [N/A:N/A] Post Debridement Measurements L x W x D (cm) [1:5.36] [2:N/A] [N/A:N/A] Post Debridement Volume: (cm) [1:Debridement] [2:N/A] [N/A:N/A] Treatment Notes Electronic Signature(s) Signed: 08/06/2022 4:39:24 PM By: Linton Ham MD Entered By: Linton Ham on 08/06/2022 16:31:48 -------------------------------------------------------------------------------- Multi-Disciplinary Care Plan Details Patient Name: Date of Service: [name]. 08/06/2022 3:45 PM Medical Record Number: [id number] Patient Account Number: [id number] Date of Birth/Sex: Treating RN: [birth year] (46 y.Darren. [name] Primary Care Maveryck Bahri: [name] Other Clinician: Referring Sameer Teeple: Treating Makaelyn Aponte/Extender: Kathrene Alu in Treatment: 3 Active Inactive Nutrition Nursing Diagnoses: Potential for alteratiion in Nutrition/Potential for imbalanced nutrition Goals: Patient/caregiver  agrees to and verbalizes understanding of need to use nutritional supplements and/or vitamins as prescribed Date Initiated: 07/16/2022 Target Resolution Date: 08/14/2022 Goal Status: Active Patient/caregiver will maintain therapeutic glucose control Date Initiated: 07/16/2022 Target Resolution Date: 08/14/2022 Goal Status: Active Interventions: Assess HgA1c results as ordered upon admission and as needed Provide education on nutrition Treatment Activities: Education provided on Nutrition : 08/06/2022 Obtain HgA1c : 07/16/2022 Patient referred to Primary Care Physician for further nutritional evaluation : 07/16/2022 Notes: Pain, Acute or Chronic Nursing Diagnoses: Pain, acute or chronic: actual or potential Potential alteration in comfort, pain Goals: Patient will verbalize adequate pain control and receive pain control interventions during procedures as needed Date Initiated: 07/16/2022 Target Resolution Date: 08/12/2022 Goal Status: Active Interventions: Encourage patient to take pain medications as prescribed Provide education on pain management Reposition patient for comfort Treatment Activities: Administer pain control measures as ordered : 07/16/2022 Notes: Wound/Skin Impairment Nursing Diagnoses: Knowledge deficit related to ulceration/compromised skin integrity Goals: Patient/caregiver will verbalize understanding of skin care regimen Date Initiated: 07/16/2022 Target Resolution Date: 08/13/2022 Goal Status: Active Interventions: Assess patient/caregiver ability to perform ulcer/skin care regimen upon admission and as needed Assess ulceration(s) every visit Provide education on ulcer and skin care Treatment Activities: Skin care regimen initiated : 07/16/2022 Topical wound management initiated : 07/16/2022 Notes: Electronic Signature(s) Signed: 08/07/2022 11:52:25 AM By: Rhae Hammock  RN Signed: 08/11/2022 5:11:51 PM By: Sharyn Creamer RN, BSN Entered By: Sharyn Creamer on  08/06/2022 16:41:45 -------------------------------------------------------------------------------- Pain Assessment Details Patient Name: Date of Service: [name] [name] 08/06/2022 3:45 PM Medical Record Number: [id number] Patient Account Number: [id number] Date of Birth/Sex: Treating RN: 1951/04/15 (30 y.Darren. [name] Primary Care Cooper Stamp: [name] Other Clinician: Referring Maryam Feely: Treating Myrta Mercer/Extender: Kathrene Alu in Treatment: 3 Active Problems Location of Pain Severity and Description of Pain Patient Has Paino No Site Locations Pain Management and Medication Current Pain Management: Electronic Signature(s) Signed: 08/11/2022 5:11:51 PM By: Sharyn Creamer RN, BSN Entered By: Sharyn Creamer on 08/06/2022 16:17:40 -------------------------------------------------------------------------------- Patient/Caregiver Education Details Patient Name: Date of Service: Donnamarie Rossetti 9/28/2023andnbsp3:45 PM Medical Record Number: [id number] Patient Account Number: [id number] Date of Birth/Gender: Treating RN: January 14, 1951 (66 y.Darren. [name] Primary Care Physician: [name] Other Clinician: Referring Physician: Treating Physician/Extender: Kathrene Alu in Treatment: 3 Education Assessment Education Provided To: Patient Education Topics Provided Basic Hygiene: Methods: Explain/Verbal Responses: Reinforcements needed, State content correctly Nutrition: Methods: Explain/Verbal Responses: Reinforcements needed, State content correctly Pain: Methods: Explain/Verbal Responses: Reinforcements needed, State content correctly Wound/Skin Impairment: Methods: Explain/Verbal Responses: Reinforcements needed, State content correctly Electronic Signature(s) Signed: 08/11/2022 5:11:51 PM By: Sharyn Creamer RN, BSN Entered By: Sharyn Creamer on 08/06/2022  16:42:00 -------------------------------------------------------------------------------- Wound Assessment Details Patient Name: Date of Service: [name] [facility]. 08/06/2022 3:45 PM Medical Record Number: [id number] Patient Account Number: [id number] Date of Birth/Sex: Treating RN: [birth year] (89 y.Darren. [name] Primary Care Sayf Kerner: [name] Other Clinician: Referring Ellarose Brandi: Treating Joud Ingwersen/Extender: Kathrene Alu in Treatment: 3 Wound Status Wound Number: 1 Primary Diabetic Wound/Ulcer of the Lower Extremity Etiology: Wound Location: Right Achilles Wound Open Wounding Event: Surgical Injury Status: Date Acquired: 07/16/2022 Comorbid Sleep Apnea, Arrhythmia, Congestive Heart Failure, Hypertension, [name] Of Treatment: 3 History: Peripheral Arterial Disease, Type II Diabetes Clustered Wound: No Photos Wound Measurements Length: (cm) 6.5 Width: (cm) 3.5 Depth: (cm) 0.3 Area: (cm) 17.868 Volume: (cm) 5.36 % Reduction in Area: 12.5% % Reduction in Volume: 47.5% Epithelialization: Small (1-33%) Tunneling: No Undermining: No Wound Description Classification: Grade 2 Wound Margin: Distinct, outline attached Exudate Amount: Large Exudate Type: Purulent Exudate Color: yellow, brown, green Foul Odor After Cleansing: Yes Due to Product Use: No Slough/Fibrino Yes Wound Bed Granulation Amount: Small (1-33%) Exposed Structure Granulation Quality: Red, Pink Fascia Exposed: No Necrotic Amount: Large (67-100%) Fat Layer (Subcutaneous Tissue) Exposed: Yes Necrotic Quality: Adherent Slough Tendon Exposed: No Muscle Exposed: No Joint Exposed: No Bone Exposed: No Treatment Notes Wound #1 (Achilles) Wound Laterality: Right Cleanser Soap and Water Discharge Instruction: May shower and wash wound with dial antibacterial soap and water prior to dressing change. Wound Cleanser Discharge Instruction: Cleanse the wound with wound  cleanser prior to applying a clean dressing using gauze sponges, not tissue or cotton balls. Peri-Wound Care Sween Lotion (Moisturizing lotion) Discharge Instruction: Apply moisturizing lotion as directed Topical Primary Dressing Hydrofera Blue Classic Foam, 4x4 in Discharge Instruction: Moisten with saline prior to applying to wound bed Santyl Ointment Discharge Instruction: Apply nickel thick amount to wound bed as instructed Secondary Dressing ABD Pad, 5x9 Discharge Instruction: Apply over primary dressing as directed. Woven Gauze Sponge, Non-Sterile 4x4 in Discharge Instruction: Apply over primary dressing as directed. Secured With Compression Wrap Kerlix Roll 4.5x3.1 (in/yd) Discharge Instruction: Apply Kerlix and Coban compression as directed. Coban Self-Adherent Wrap 4x5 (in/yd) Discharge  Instruction: Apply over Kerlix as directed. Compression Stockings Add-Ons Electronic Signature(s) Signed: 08/11/2022 5:11:51 PM By: Sharyn Creamer RN, BSN Entered By: Sharyn Creamer on 08/06/2022 16:17:19 -------------------------------------------------------------------------------- Wound Assessment Details Patient Name: Date of Service: [name]. 08/06/2022 3:45 PM Medical Record Number: [id number] Patient Account Number: [id number] Date of Birth/Sex: Treating RN: May 23, 1951 (57 y.Darren. [name] Primary Care Gita Dilger: [name] Other Clinician: Referring Kendan Cornforth: Treating Dillinger Aston/Extender: Kathrene Alu in Treatment: 3 Wound Status Wound Number: 2 Primary Diabetic Wound/Ulcer of the Lower Extremity Etiology: Wound Location: Left, Plantar Foot Wound Open Wounding Event: Gradually Appeared Status: Date Acquired: 03/09/2022 Comorbid Sleep Apnea, Arrhythmia, Congestive Heart Failure, Hypertension, [name] Of Treatment: 3 History: Peripheral Arterial Disease, Type II Diabetes Clustered Wound: No Photos Wound Measurements Length:  (cm) 0.7 Width: (cm) 0.5 Depth: (cm) 0.5 Area: (cm) 0.275 Volume: (cm) 0.137 % Reduction in Area: -25% % Reduction in Volume: -107.6% Epithelialization: None Tunneling: No Undermining: Yes Starting Position (Darren'clock): 6 Ending Position (Darren'clock): 2 Maximum Distance: (cm) 0.5 Wound Description Classification: Grade 2 Wound Margin: Distinct, outline attached Exudate Amount: Medium Exudate Type: Serosanguineous Exudate Color: red, brown Foul Odor After Cleansing: No Slough/Fibrino No Wound Bed Granulation Amount: Large (67-100%) Exposed Structure Granulation Quality: Pink, Pale Fascia Exposed: No Necrotic Amount: Small (1-33%) Fat Layer (Subcutaneous Tissue) Exposed: Yes Necrotic Quality: Adherent Slough Tendon Exposed: No Muscle Exposed: No Joint Exposed: No Bone Exposed: No Treatment Notes Wound #2 (Foot) Wound Laterality: Plantar, Left Cleanser Soap and Water Discharge Instruction: May shower and wash wound with dial antibacterial soap and water prior to dressing change. Wound Cleanser Discharge Instruction: Cleanse the wound with wound cleanser prior to applying a clean dressing using gauze sponges, not tissue or cotton balls. Peri-Wound Care Skin Prep Discharge Instruction: Use skin prep as directed Topical Primary Dressing KerraCel Ag Gelling Fiber Dressing, 4x5 in (silver alginate) Discharge Instruction: Apply silver alginate to wound bed as instructed MediHoney Gel, tube 1.5 (oz) Discharge Instruction: Apply to wound bed as instructed Secondary Dressing Optifoam Non-Adhesive Dressing, 4x4 in Discharge Instruction: Apply over primary dressing as directed. Woven Gauze Sponges 2x2 in Discharge Instruction: Apply over primary dressing as directed. Secured With [employer], 4.5x3.1 (in/yd) Discharge Instruction: Secure with Kerlix as directed. 59M Medipore H Soft Cloth Surgical T ape, 4 x 10 (in/yd) Discharge Instruction: Secure with tape as  directed. Compression Wrap Compression Stockings Add-Ons Electronic Signature(s) Signed: 08/11/2022 5:11:51 PM By: Sharyn Creamer RN, BSN Entered By: Sharyn Creamer on 08/06/2022 16:16:53 -------------------------------------------------------------------------------- Vitals Details Patient Name: Date of Service: [name] 08/06/2022 3:45 PM Medical Record Number: [id number] Patient Account Number: [id number] Date of Birth/Sex: Treating RN: 01/25/1951 (65 y.Darren. [name] Primary Care Annaleia Pence: [name] Other Clinician: Referring Na Waldrip: Treating Audine Mangione/Extender: Kathrene Alu in Treatment: 3 Vital Signs Time Taken: 16:09 Temperature (F): 98.2 Height (in): 68 Pulse (bpm): 101 Weight (lbs): 185 Respiratory Rate (breaths/min): 17 Body Mass Index (BMI): 28.1 Blood Pressure (mmHg): 128/81 Reference Range: 80 - 120 mg / dl Electronic Signature(s) Signed: 08/11/2022 5:11:51 PM By: Sharyn Creamer RN, BSN Entered By: Sharyn Creamer on 08/06/2022 16:09:50

## 2022-08-13 ENCOUNTER — Encounter (HOSPITAL_BASED_OUTPATIENT_CLINIC_OR_DEPARTMENT_OTHER): Payer: Medicare HMO | Attending: Internal Medicine | Admitting: Internal Medicine

## 2022-08-13 ENCOUNTER — Encounter (HOSPITAL_BASED_OUTPATIENT_CLINIC_OR_DEPARTMENT_OTHER): Payer: Medicare HMO | Attending: General Surgery | Admitting: General Surgery

## 2022-08-13 DIAGNOSIS — L97522 Non-pressure chronic ulcer of other part of left foot with fat layer exposed: Secondary | ICD-10-CM | POA: Insufficient documentation

## 2022-08-13 DIAGNOSIS — I110 Hypertensive heart disease with heart failure: Secondary | ICD-10-CM | POA: Diagnosis not present

## 2022-08-13 DIAGNOSIS — F17200 Nicotine dependence, unspecified, uncomplicated: Secondary | ICD-10-CM | POA: Insufficient documentation

## 2022-08-13 DIAGNOSIS — I5022 Chronic systolic (congestive) heart failure: Secondary | ICD-10-CM | POA: Diagnosis not present

## 2022-08-13 DIAGNOSIS — E11621 Type 2 diabetes mellitus with foot ulcer: Secondary | ICD-10-CM | POA: Insufficient documentation

## 2022-08-13 DIAGNOSIS — G4733 Obstructive sleep apnea (adult) (pediatric): Secondary | ICD-10-CM | POA: Insufficient documentation

## 2022-08-13 DIAGNOSIS — E1151 Type 2 diabetes mellitus with diabetic peripheral angiopathy without gangrene: Secondary | ICD-10-CM | POA: Diagnosis not present

## 2022-08-13 DIAGNOSIS — Z794 Long term (current) use of insulin: Secondary | ICD-10-CM | POA: Insufficient documentation

## 2022-08-13 DIAGNOSIS — E1140 Type 2 diabetes mellitus with diabetic neuropathy, unspecified: Secondary | ICD-10-CM | POA: Diagnosis not present

## 2022-08-13 DIAGNOSIS — L97813 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: Secondary | ICD-10-CM | POA: Diagnosis not present

## 2022-08-13 DIAGNOSIS — L97518 Non-pressure chronic ulcer of other part of right foot with other specified severity: Secondary | ICD-10-CM | POA: Diagnosis not present

## 2022-08-13 NOTE — Progress Notes (Addendum)
Darren, [name] ([id number]) Visit Report for 08/13/2022 Chief Complaint Document Details Patient Name: Date of Service: [name] [facility] A. 08/13/2022 3:30 PM Medical Record Number: [id number] Patient Account Number: [id number] Date of Birth/Sex: Treating RN: [birth year] (71 y.o. M) Primary Care Provider: [name] Other Clinician: Referring Provider: Treating Provider/Extender: [name], [name] in Treatment: 4 Information Obtained from: Patient Chief Complaint 07/16/2022; bilateral lower extremity wounds Electronic Signature(s) Signed: 08/14/2022 9:13:22 AM By: [name] DO Entered By: [name] on 08/14/2022 09:06:34 -------------------------------------------------------------------------------- Debridement Details Patient Name: Date of Service: [name] [facility] A. 08/13/2022 3:30 PM Medical Record Number: [id number] Patient Account Number: [id number] Date of Birth/Sex: Treating RN: 1951/03/07 (71 y.o. [name] Primary Care Provider: [name] Other Clinician: Referring Provider: Treating Provider/Extender: [name] Ngetich, [name] in Treatment: 4 Debridement Performed for Assessment: Wound #2 Left,Plantar Foot Performed By: Physician [name], DO Debridement Type: Debridement Severity of Tissue Pre Debridement: Fat layer exposed Level of Consciousness (Pre-procedure): Awake and Alert Pre-procedure Verification/Time Out Yes - 15:59 Taken: Start Time: 16:06 Pain Control: Lidocaine 4% T opical Solution T Area Debrided (L x W): otal 1 (cm) x 1.8 (cm) = 1.8 (cm) Tissue and other material debrided: Non-Viable, Slough, Subcutaneous, Slough Level: Skin/Subcutaneous Tissue Debridement Description: Excisional Instrument: Curette Bleeding: Minimum Hemostasis Achieved: Pressure End Time: 16:10 Response to Treatment: Procedure was tolerated well Level of Consciousness (Post- Awake and  Alert procedure): Post Debridement Measurements of Total Wound Length: (cm) 1 Width: (cm) 1.8 Depth: (cm) 0.4 Volume: (cm) 0.565 Character of Wound/Ulcer Post Debridement: Stable Severity of Tissue Post Debridement: Fat layer exposed Post Procedure Diagnosis Same as Pre-procedure Notes Procedure scribed for Dr. Heber [place] by Janan Halter, RN Electronic Signature(s) Signed: 08/13/2022 4:20:55 PM By: [name] DO Signed: 08/13/2022 4:56:32 PM By: Lorrin [name] Entered By: Lorrin [name] on 08/13/2022 16:15:01 -------------------------------------------------------------------------------- Debridement Details Patient Name: Date of Service: [name] [name] 08/13/2022 3:30 PM Medical Record Number: [id number] Patient Account Number: [id number] Date of Birth/Sex: Treating RN: 1951-05-24 (71 y.o. [name] Primary Care Provider: [name] Other Clinician: Referring Provider: Treating Provider/Extender: [name] Ngetich, [name] in Treatment: 4 Debridement Performed for Assessment: Wound #1 Right Achilles Performed By: Physician [name], DO Debridement Type: Debridement Severity of Tissue Pre Debridement: Fat layer exposed Level of Consciousness (Pre-procedure): Awake and Alert Pre-procedure Verification/Time Out Yes - 15:59 Taken: Start Time: 16:00 Pain Control: Lidocaine 4% T opical Solution T Area Debrided (L x W): otal 6.7 (cm) x 3 (cm) = 20.1 (cm) Tissue and other material debrided: Slough, Subcutaneous, Slough Level: Skin/Subcutaneous Tissue Debridement Description: Excisional Instrument: Curette Bleeding: Minimum Hemostasis Achieved: Pressure End Time: 16:06 Response to Treatment: Procedure was tolerated well Level of Consciousness (Post- Awake and Alert procedure): Post Debridement Measurements of Total Wound Length: (cm) 6.7 Width: (cm) 3 Depth: (cm) 0.3 Volume: (cm) 4.736 Character of Wound/Ulcer Post Debridement:  Stable Severity of Tissue Post Debridement: Fat layer exposed Post Procedure Diagnosis Same as Pre-procedure Notes Procedure scribed for Dr. Heber [place] by Janan Halter, RN Electronic Signature(s) Signed: 08/13/2022 4:20:55 PM By: [name] DO Signed: 08/13/2022 4:56:32 PM By: Lorrin [name] Entered By: Lorrin [name] on 08/13/2022 16:15:11 -------------------------------------------------------------------------------- HPI Details Patient Name: Date of Service: [name] [name] 08/13/2022 3:30 PM Medical Record Number: [id number] Patient Account Number: [id number] Date of Birth/Sex: Treating RN: [birth year] (71 y.o. M) Primary Care Provider: [name] Other Clinician: Referring Provider: Treating Provider/Extender: [name]  Ngetich, [name] in Treatment: 4 History of Present Illness HPI Description: Admission 07/16/2022 [name] is a 71 year old male with a past medical history of insulin-dependent currently controlled type 2 diabetes complicated by peripheral neuropathy, chronic systolic heart failure, obstructive sleep apnea, and peripheral vascular disease that presents to the clinic for a several month history of nonhealing ulcer to the back of the right leg and a 51-monthhistory of nonhealing wounds to the left foot. He is not sure how the wounds started. He has been following with podiatry for this issue. He had removal of the tendon to the right lower extremity by Dr. SBlenda Mounts He has been using Betadine to the wound beds. On 06/11/2022 he had a right posterior tibial artery angioplasty by Dr. CCarlis Abbott It was reported the patient is optimized after revascularization of the right lower extremity. His ABIs on the left were 0.91. He currently denies systemic signs of infection. He also does not wear shoes. He came in with Kerlix wrap to his feet bilaterally. This did not completely cover his feet. 07/23/2022: This is a patient of Dr. HJodene Namthat she asked  me to take a look at last week due to the significant involvement of the muscle and tendon on his right posterior leg wound. He needed an aggressive debridement and asked me if I would be able to perform this on her behalf. The patient is here today for that procedure. When his dressing was removed in clinic today, the wound was teeming with maggots. There is necrotic muscle, tendon, and fat, with a thick layer of slough on the wound. 9/21; patient presents for follow-up. He was debrided by Dr. CCeline Ahrat last clinic visit without any issues. He has been placing Dakin's wet-to-dry dressings to the right posterior wound. He has been using Medihoney to the left foot wound. He reports improvement in wound healing. He has no issues or complaints today. 9/28; patient with type 2 diabetes PAD status post revascularization. He underwent retrograde right PTA angioplasty. Last saw Dr. CCarlis Abbotton 9/12 and was felt to have a brisk posterior tibial signal at the right ankle. He had a pulsatile toe tracing that appeared adequate for healing. He has been using Santyl and Hydrofera Blue on the right Achilles area. He has a smaller area on the left fifth plantar metatarsal head They were apparently in the ER on 9/23 with swelling and discoloration above the wrap. They have a picture of the leg after the wrap was taken off which looks like that it was excessively tight superiorly 10/6; patient presents for follow-up. We have been using Santyl and Hydrofera Blue to the right lower extremity wound under Kerlix/Coban. T the left foot we o have been using silver alginate with Medihoney. He again comes in with no shoes. He has no issues or complaints today. Electronic Signature(s) Signed: 08/14/2022 9:13:22 AM By: HKalman ShanDO Entered By: HKalman Shanon 08/14/2022 09:07:28 -------------------------------------------------------------------------------- Physical Exam Details [facility]: [facility] Medical Record Number: [id number] Account Number: [id number] of Birth/Sex: Treating RN: 104-24-1952(71y.o. M) Primary Care Provider: NMarlowe SaxOther Clinician: Referring Provider: Treating Provider/Extender: HKalman ShanNgetich, [name] in Treatment: 4 Constitutional respirations regular, non-labored and within target range for patient.. Cardiovascular 2+ dorsalis pedis/posterior tibialis pulses. Psychiatric pleasant and cooperative. Notes Right lower extremity: T the posterior aspect there is granulation tissue and nonviable tissue. o Left lower extremity: T the plantar  aspect of the fifth met head there is an open wound with granulation tissue Devitalized tissue and nonviable tissue. o No signs of surrounding infection tending to the wound beds. Venous stasis dermatitis and lymphedema skin changes bilaterally Electronic Signature(s) Signed: 08/14/2022 9:13:22 AM By: [name] DO Entered By: [name] on 08/14/2022 09:10:38 -------------------------------------------------------------------------------- Physician Orders Details Patient Name: Date of Service: [name] [facility] A. 08/13/2022 3:30 PM Medical Record Number: [id number] Patient Account Number: [id number] Date of Birth/Sex: Treating RN: [birth year] (71 y.o. [name] Primary Care Provider: [name] Other Clinician: Referring Provider: Treating Provider/Extender: [name] Ngetich, [name] in Treatment: 4 Verbal / Phone Orders: No Diagnosis Coding Follow-up Appointments ppointment in 1 week. - Dr. Heber [place] Room 8 Return A Other: - [facility]- will ship wound care supplies. Anesthetic (In clinic) Topical Lidocaine 5% applied to wound bed (In clinic) Topical Lidocaine 4% applied to wound bed Bathing/ Shower/ Hygiene May shower with protection but do not get wound dressing(s) wet. Edema Control - Lymphedema / SCD  / Other Elevate legs to the level of the heart or above for 30 minutes daily and/or when sitting, a frequency of: - 3-4 times a day throughout the day. Avoid standing for long periods of time. Off-Loading Open toe surgical shoe to: - to both feet Other: - minimize walking and standing to aid in offloading pressure to wound. Wound Treatment Wound #1 - Achilles Wound Laterality: Right Cleanser: Dakins Solution 1 x Per Week/30 Days Discharge Instructions: Clean with Dakins Peri-Wound Care: Sween Lotion (Moisturizing lotion) 1 x Per Week/30 Days Discharge Instructions: Apply moisturizing lotion as directed Prim Dressing: Hydrofera Blue Classic Foam, 4x4 in 1 x Per Week/30 Days ary Discharge Instructions: Moisten with saline prior to applying to wound bed Prim Dressing: Santyl Ointment 1 x Per Week/30 Days ary Discharge Instructions: Apply nickel thick amount to wound bed as instructed Secondary Dressing: ABD Pad, 5x9 1 x Per Week/30 Days Discharge Instructions: Apply over primary dressing as directed. Secondary Dressing: Woven Gauze Sponge, Non-Sterile 4x4 in 1 x Per Week/30 Days Discharge Instructions: Apply over primary dressing as directed. Compression Wrap: Kerlix Roll 4.5x3.1 (in/yd) 1 x Per Week/30 Days Discharge Instructions: Apply Kerlix and Coban compression as directed. Compression Wrap: Coban Self-Adherent Wrap 4x5 (in/yd) 1 x Per Week/30 Days Discharge Instructions: Apply over Kerlix as directed. Wound #2 - Foot Wound Laterality: Plantar, Left Cleanser: Dakins Solution 1 x Per Day/30 Days Peri-Wound Care: Skin Prep 1 x Per Day/30 Days Discharge Instructions: Use skin prep as directed Prim Dressing: KerraCel Ag Gelling Fiber Dressing, 4x5 in (silver alginate) 1 x Per Day/30 Days ary Discharge Instructions: Apply silver alginate to wound bed as instructed Secondary Dressing: Felt 2.5 yds x 5.5 in 1 x Per Day/30 Days Discharge Instructions: Apply as donut in shoe Secondary  Dressing: Woven Gauze Sponges 2x2 in 1 x Per Day/30 Days Discharge Instructions: Apply over primary dressing as directed. Secured With: [employer], 4.5x3.1 (in/yd) 1 x Per Day/30 Days Discharge Instructions: Secure with Kerlix as directed. Secured With: 36M Medipore H Soft Cloth Surgical T ape, 4 x 10 (in/yd) 1 x Per Day/30 Days Discharge Instructions: Secure with tape as directed. Electronic Signature(s) Signed: 08/14/2022 9:13:22 AM By: [name] DO Previous Signature: 08/13/2022 4:20:55 PM Version By: [name] DO Previous Signature: 08/13/2022 4:56:32 PM Version By: Lorrin [name] Entered By: [name] on 08/14/2022 09:10:46 -------------------------------------------------------------------------------- Problem List Details Patient Name: Date of Service: [name] [name] 08/13/2022  3:30 PM Medical Record Number: [id number] Patient Account Number: [id number] Date of Birth/Sex: Treating RN: Apr 14, 1951 (71 y.o. M) Primary Care Provider: [name] Other Clinician: Referring Provider: Treating Provider/Extender: [name], [name] in Treatment: 4 Active Problems ICD-10 Encounter Code Description Active Date MDM Diagnosis L97.518 Non-pressure chronic ulcer of other part of right foot with other specified 07/16/2022 No Yes severity L97.522 Non-pressure chronic ulcer of other part of left foot with fat layer exposed 07/16/2022 No Yes E11.621 Type 2 diabetes mellitus with foot ulcer 07/16/2022 No Yes I73.9 Peripheral vascular disease, unspecified 07/16/2022 No Yes 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 07/23/2022 No Yes muscle Inactive Problems Resolved Problems Electronic Signature(s) Signed: 08/14/2022 9:13:22 AM By: [name] DO Entered By: [name] on 08/14/2022 09:06:22 -------------------------------------------------------------------------------- Progress Note  Details Patient Name: Date of Service: [name], Darren A. 08/13/2022 3:30 PM Medical Record Number: [id number] Patient Account Number: [id number] Date of Birth/Sex: Treating RN: January 03, 1951 (71 y.o. M) Primary Care Provider: [name] Other Clinician: Referring Provider: Treating Provider/Extender: [name], [name] in Treatment: 4 Subjective Chief Complaint Information obtained from Patient 07/16/2022; bilateral lower extremity wounds History of Present Illness (HPI) Admission 07/16/2022 Mr. Hassani Sliney is a 71 year old male with a past medical history of insulin-dependent currently controlled type 2 diabetes complicated by peripheral neuropathy, chronic systolic heart failure, obstructive sleep apnea, and peripheral vascular disease that presents to the clinic for a several month history of nonhealing ulcer to the back of the right leg and a 68-monthhistory of nonhealing wounds to the left foot. He is not sure how the wounds started. He has been following with podiatry for this issue. He had removal of the tendon to the right lower extremity by Dr. SBlenda Mounts He has been using Betadine to the wound beds. On 06/11/2022 he had a right posterior tibial artery angioplasty by Dr. CCarlis Abbott It was reported the patient is optimized after revascularization of the right lower extremity. His ABIs on the left were 0.91. He currently denies systemic signs of infection. He also does not wear shoes. He came in with Kerlix wrap to his feet bilaterally. This did not completely cover his feet. 07/23/2022: This is a patient of Dr. HJodene Namthat she asked me to take a look at last week due to the significant involvement of the muscle and tendon on his right posterior leg wound. He needed an aggressive debridement and asked me if I would be able to perform this on her behalf. The patient is here today for that procedure. When his dressing was removed in clinic today, the wound was teeming  with maggots. There is necrotic muscle, tendon, and fat, with a thick layer of slough on the wound. 9/21; patient presents for follow-up. He was debrided by Dr. CCeline Ahrat last clinic visit without any issues. He has been placing Dakin's wet-to-dry dressings to the right posterior wound. He has been using Medihoney to the left foot wound. He reports improvement in wound healing. He has no issues or complaints today. 9/28; patient with type 2 diabetes PAD status post revascularization. He underwent retrograde right PTA angioplasty. Last saw Dr. CCarlis Abbotton 9/12 and was felt to have a brisk posterior tibial signal at the right ankle. He had a pulsatile toe tracing that appeared adequate for healing. He has been using Santyl and Hydrofera Blue on the right Achilles area. oo He has a smaller area on the left fifth plantar  metatarsal head They were apparently in the ER on 9/23 with swelling and discoloration above the wrap. They have a picture of the leg after the wrap was taken off which looks like that it was excessively tight superiorly 10/6; patient presents for follow-up. We have been using Santyl and Hydrofera Blue to the right lower extremity wound under Kerlix/Coban. T the left foot we o have been using silver alginate with Medihoney. He again comes in with no shoes. He has no issues or complaints today. Patient History Information obtained from Chart. Family History Diabetes - Mother, Heart Disease - Mother,Siblings. Social History Current every day smoker, Alcohol Use - Never, Drug Use - No History, Caffeine Use - Never. Medical History Respiratory Patient has history of Sleep Apnea - does not wear Cardiovascular Patient has history of Arrhythmia - A. Fib, Congestive Heart Failure - EF 25%, Hypertension, Peripheral Arterial Disease Denies history of Peripheral Venous Disease Endocrine Patient has history of Type II Diabetes Hospitalization/Surgery History - Angiogram 06/11/2022 Dr. Carlis Abbott  [facility]. - cardioversion 11/06/2021, 2015, 2017. 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 Medical A Surgical History Notes nd Gastrointestinal Ascites Genitourinary stage III Kidney disease Objective Constitutional respirations regular, non-labored and within target range for patient.. Vitals Time Taken: 3:52 PM, Height: 68 in, Weight: 185 lbs, BMI: 28.1, Temperature: 97.9 F, Pulse: 87 bpm, Respiratory Rate: 18 breaths/min, Blood Pressure: 161/85 mmHg. General Notes: patient doesn't check blood sugar Cardiovascular 2+ dorsalis pedis/posterior tibialis pulses. Psychiatric pleasant and cooperative. General Notes: Right lower extremity: T the posterior aspect there is granulation tissue and nonviable tissue. Left lower extremity: T the plantar aspect of the o o fifth met head there is an open wound with granulation tissue Devitalized tissue and nonviable tissue. No signs of surrounding infection tending to the wound beds. Venous stasis dermatitis and lymphedema skin changes bilaterally Integumentary (Hair, Skin) Wound #1 status is Open. Original cause of wound was Surgical Injury. The date acquired was: 07/16/2022. The wound has been in treatment 4 weeks. The wound is located on the Right Achilles. The wound measures 6.7cm length x 3cm width x 0.3cm depth; 15.787cm^2 area and 4.736cm^3 volume. There is tendon and Fat Layer (Subcutaneous Tissue) exposed. There is no tunneling or undermining noted. There is a large amount of serosanguineous drainage noted. Foul odor after cleansing was noted. The wound margin is distinct with the outline attached to the wound base. There is large (67-100%) red, pink granulation within the wound bed. There is a small (1-33%) amount of necrotic tissue within the wound bed including Adherent Slough. The periwound skin appearance had no abnormalities noted for texture. The periwound skin appearance had no abnormalities noted for moisture. The periwound skin appearance did  not exhibit: Atrophie Blanche, Cyanosis, Ecchymosis, Hemosiderin Staining, Mottled, Pallor, Rubor, Erythema. Periwound temperature was noted as No Abnormality. Wound #2 status is Open. Original cause of wound was Gradually Appeared. The date acquired was: 03/09/2022. The wound has been in treatment 4 weeks. The wound is located on the [place]. The wound measures 1cm length x 1.8cm width x 0.4cm depth; 1.414cm^2 area and 0.565cm^3 volume. There is Fat Layer (Subcutaneous Tissue) exposed. There is no tunneling or undermining noted. There is a medium amount of serosanguineous drainage noted. The wound margin is distinct with the outline attached to the wound base. There is large (67-100%) pink, pale granulation within the wound bed. There is a small (1-33%) amount of necrotic tissue within the wound bed including Adherent Slough. The periwound skin appearance  had no abnormalities noted for texture. The periwound skin appearance had no abnormalities noted for moisture. The periwound skin appearance exhibited: Hemosiderin Staining. The periwound skin appearance did not exhibit: Atrophie Blanche, Cyanosis, Ecchymosis, Mottled, Pallor, Rubor, Erythema. Periwound temperature was noted as No Abnormality. 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 I debrided nonviable tissue. I recommended continuing the course with Santyl and Hydrofera Blue under Kerlix/Coban. His left foot wound has declined in size and appearance since last clinic visit. I debrided nonviable tissue. I  recommended stopping Medihoney as there is a lot of maceration. And just using silver alginate. I highly recommended offloading this area. He does not wear shoes and we will give him surgical shoes today. Also recommended using a foam donut offloading pad. We gave him 1 of these today. Procedures Wound #1 Pre-procedure diagnosis of Wound #1 is a Diabetic Wound/Ulcer of the Lower Extremity located on the Right Achilles .Severity of Tissue Pre Debridement is: Fat layer exposed. There was a Excisional Skin/Subcutaneous Tissue Debridement with a total area of 20.1 sq cm performed by [name], DO. With the following instrument(s): Curette Material removed includes Subcutaneous Tissue and Slough and after achieving pain control using Lidocaine 4% Topical Solution. No specimens were taken. A time out was conducted at 15:59, prior to the start of the procedure. A Minimum amount of bleeding was controlled with Pressure. The procedure was tolerated well. Post Debridement Measurements: 6.7cm length x 3cm width x 0.3cm depth; 4.736cm^3 volume. Character of Wound/Ulcer Post Debridement is stable. Severity of Tissue Post Debridement is: Fat layer exposed. Post procedure Diagnosis Wound #1: Same as Pre-Procedure General Notes: Procedure scribed for Dr. Heber [place] by Janan Halter, RN. Wound #2 Pre-procedure diagnosis of Wound #2 is a Diabetic Wound/Ulcer of the Lower Extremity located on the [place] .Severity of Tissue Pre Debridement is: Fat layer exposed. There was a Excisional Skin/Subcutaneous Tissue Debridement with a total area of 1.8 sq cm performed by [name], DO. With the following instrument(s): Curette to remove Non-Viable tissue/material. Material removed includes Subcutaneous Tissue and Slough and after achieving pain control using Lidocaine 4% T opical Solution. No specimens were taken. A time out was conducted at 15:59, prior to the start of the procedure. A Minimum amount of  bleeding was controlled with Pressure. The procedure was tolerated well. Post Debridement Measurements: 1cm length x 1.8cm width x 0.4cm depth; 0.565cm^3 volume. Character of Wound/Ulcer Post Debridement is stable. Severity of Tissue Post Debridement is: Fat layer exposed. Post procedure Diagnosis Wound #2: Same as Pre-Procedure General Notes: Procedure scribed for Dr. Heber [place] by Janan Halter, RN. Plan Follow-up Appointments: Return Appointment in 1 week. - Dr. Heber [place] Room 8 Other: - [facility]- will ship wound care supplies. Anesthetic: (In clinic) Topical Lidocaine 5% applied to wound bed (In clinic) Topical Lidocaine 4% applied to wound bed Bathing/ Shower/ Hygiene: May shower with protection but do not get wound dressing(s) wet. Edema Control - Lymphedema / SCD / Other: Elevate legs to the level of the heart or above for 30 minutes daily and/or when sitting, a frequency of: - 3-4 times a day throughout the day.  Avoid standing for long periods of time. Off-Loading: Open toe surgical shoe to: - to both feet Other: - minimize walking and standing to aid in offloading pressure to wound. WOUND #1: - Achilles Wound Laterality: Right Cleanser: Dakins Solution 1 x Per Week/30 Days Discharge Instructions: Clean with Dakins Peri-Wound Care: Sween Lotion (Moisturizing lotion) 1 x Per Week/30 Days Discharge Instructions: Apply moisturizing lotion as directed Prim Dressing: Hydrofera Blue Classic Foam, 4x4 in 1 x Per Week/30 Days ary Discharge Instructions: Moisten with saline prior to applying to wound bed Prim Dressing: Santyl Ointment 1 x Per Week/30 Days ary Discharge Instructions: Apply nickel thick amount to wound bed as instructed Secondary Dressing: ABD Pad, 5x9 1 x Per Week/30 Days Discharge Instructions: Apply over primary dressing as directed. Secondary Dressing: Woven Gauze Sponge, Non-Sterile 4x4 in 1 x Per Week/30 Days Discharge Instructions: Apply over primary dressing as  directed. Com pression Wrap: Kerlix Roll 4.5x3.1 (in/yd) 1 x Per Week/30 Days Discharge Instructions: Apply Kerlix and Coban compression as directed. Com pression Wrap: Coban Self-Adherent Wrap 4x5 (in/yd) 1 x Per Week/30 Days Discharge Instructions: Apply over Kerlix as directed. WOUND #2: - Foot Wound Laterality: Plantar, Left Cleanser: Dakins Solution 1 x Per Day/30 Days Peri-Wound Care: Skin Prep 1 x Per Day/30 Days Discharge Instructions: Use skin prep as directed Prim Dressing: KerraCel Ag Gelling Fiber Dressing, 4x5 in (silver alginate) 1 x Per Day/30 Days ary Discharge Instructions: Apply silver alginate to wound bed as instructed Secondary Dressing: Felt 2.5 yds x 5.5 in 1 x Per Day/30 Days Discharge Instructions: Apply as donut in shoe Secondary Dressing: Woven Gauze Sponges 2x2 in 1 x Per Day/30 Days Discharge Instructions: Apply over primary dressing as directed. Secured With: [employer], 4.5x3.1 (in/yd) 1 x Per Day/30 Days Discharge Instructions: Secure with Kerlix as directed. Secured With: 63M Medipore H Soft Cloth Surgical T ape, 4 x 10 (in/yd) 1 x Per Day/30 Days Discharge Instructions: Secure with tape as directed. 1. In office sharp debridement 2. Hydrofera Blue and Santyl under Kerlix/Coban 3. Silver alginate to the left foot 4. Aggressive offloadingoosurgical shoe offloading donut pad 5. Follow-up in 1 week Electronic Signature(s) Signed: 08/14/2022 9:13:22 AM By: [name] DO Entered By: [name] on 08/14/2022 09:12:21 -------------------------------------------------------------------------------- HxROS Details Patient Name: Date of Service: [name] [name] 08/13/2022 3:30 PM Medical Record Number: [id number] Patient Account Number: [id number] Date of Birth/Sex: Treating RN: 24-Mar-1951 (71 y.o. M) Primary Care Provider: [name] Other Clinician: Referring Provider: Treating Provider/Extender: [name] Ngetich,  [name] in Treatment: 4 Information Obtained From Chart Respiratory Medical History: Positive for: Sleep Apnea - does not wear Cardiovascular Medical History: Positive for: Arrhythmia - A. Fib; Congestive Heart Failure - EF 25%; Hypertension; Peripheral Arterial Disease Negative for: Peripheral Venous Disease Gastrointestinal Medical History: Past Medical History Notes: Ascites Endocrine Medical History: Positive for: Type II Diabetes Time with diabetes: 6 years Treated with: Insulin Blood sugar tested every day: No Genitourinary Medical History: Past Medical History Notes: stage III Kidney disease Immunizations Pneumococcal Vaccine: Received Pneumococcal Vaccination: No Implantable Devices No devices added Hospitalization / Surgery History Type of Hospitalization/Surgery Angiogram 06/11/2022 Dr. 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 08/13/2022 Medical Record Number: [id number] Patient Account Number: [id number] Date of Birth/Sex: Treating RN: [birth year] (71 y.o. 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 Facility Procedures CPT4 Code: [id number] Description: 66063 - DEB SUBQ TISSUE 20 SQ CM/< ICD-10 Diagnosis Description 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 Modifier: uscle Quantity: 1 CPT4 Code: [id number] Description: 11045 - DEB SUBQ TISS EA ADDL 20CM ICD-10 Diagnosis Description 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 Modifier: uscle Quantity: 1 Physician Procedures : CPT4 Code Description Modifier [id number] 11042 - WC PHYS SUBQ TISS 20 SQ CM ICD-10 Diagnosis Description 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 L97.522 Non-pressure chronic ulcer of other part of left  foot with fat layer exposed Quantity: 1 : [id number] 06237 - WC PHYS SUBQ TISS EA ADDL 20 CM ICD-10 Diagnosis Description 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

## 2022-08-13 NOTE — Progress Notes (Addendum)
ELWARD, RENICK ([id number]) Visit Report for 08/13/2022 Arrival Information Details Patient Name: Date of Service: [place]. 08/13/2022 3:30 PM Medical Record Number: [id number] Patient Account Number: [id number] Date of Birth/Sex: Treating RN: 1951/09/13 (71 y.o. [name] Primary Care Tomeka Kantner: [name] Other Clinician: Referring Mathis Cashman: Treating Luisantonio Adinolfi/Extender: [name] Ngetich, [name] in Treatment: 4 Visit Information History Since Last Visit Added or deleted any medications: No Patient Arrived: [name] Any new allergies or adverse reactions: No Arrival Time: 15:48 Had a fall or experienced change in No Accompanied By: Daughter activities of daily living that may affect Transfer Assistance: None risk of falls: Patient Identification Verified: Yes Signs or symptoms of abuse/neglect since last visito No Secondary Verification Process Completed: Yes Hospitalized since last visit: No Patient Requires Transmission-Based Precautions: No Implantable device outside of the clinic excluding No Patient Has Alerts: Yes cellular tissue based products placed in the center Patient Alerts: Patient on Blood Thinner since last visit: Has Dressing in Place as Prescribed: Yes Has Compression in Place as Prescribed: Yes Pain Present Now: No Electronic Signature(s) Signed: 08/13/2022 4:56:32 PM By: Lorrin [name] Entered By: Lorrin [name] on 08/13/2022 15:52:51 -------------------------------------------------------------------------------- Encounter Discharge Information Details Patient Name: Date of Service: [place], [place] A. 08/13/2022 3:30 PM Medical Record Number: [id number] Patient Account Number: [id number] Date of Birth/Sex: Treating RN: 1951-10-05 (71 y.o. [name] Primary Care Velera Lansdale: [name] Other Clinician: Referring Kyona Chauncey: Treating Amahri Dengel/Extender: [name] Ngetich, [name] in Treatment:  4 Encounter Discharge Information Items Post Procedure Vitals Discharge Condition: Stable Temperature (F): 97.9 Ambulatory Status: Cane Pulse (bpm): 87 Discharge Destination: Home Respiratory Rate (breaths/min): 18 Transportation: Private Auto Blood Pressure (mmHg): 161/85 Accompanied By: daughter Schedule Follow-up Appointment: Yes Clinical Summary of Care: Provided on 08/13/2022 Form Type Recipient Paper Patient Patient Electronic Signature(s) Signed: 08/13/2022 4:56:32 PM By: Lorrin [name] Entered By: Lorrin [name] on 08/13/2022 16:48:01 -------------------------------------------------------------------------------- Lower Extremity Assessment Details Patient Name: Date of Service: [name] [facility] A. 08/13/2022 3:30 PM Medical Record Number: [id number] Patient Account Number: [id number] Date of Birth/Sex: Treating RN: 1951/03/01 (71 y.o. [name] Primary Care Shasta Chinn: [name] Other Clinician: Referring Alliene Klugh: Treating Verner Kopischke/Extender: [name] Ngetich, [name] in Treatment: 4 Edema Assessment Assessed: [Left: Yes] [Right: Yes] Edema: [Left: Yes] [Right: Yes] Calf Left: Right: Point of Measurement: 39 cm From Medial Instep 37 cm 38 cm Ankle Left: Right: Point of Measurement: 8 cm From Medial Instep 24.2 cm 25 cm Vascular Assessment Pulses: Dorsalis Pedis Palpable: [Left:Yes] [Right:Yes] Electronic Signature(s) Signed: 08/13/2022 4:56:32 PM By: Lorrin [name] Entered By: Lorrin [name] on 08/13/2022 15:57:16 -------------------------------------------------------------------------------- Multi Wound Chart Details Patient Name: Date of Service: [name] [facility] A. 08/13/2022 3:30 PM Medical Record Number: [id number] Patient Account Number: [id number] Date of Birth/Sex: Treating RN: 02-28-1951 (71 y.o. M) Primary Care Bethan Adamek: [name] Other Clinician: Referring Maddon Horton: Treating Jacy Brocker/Extender: [name] Ngetich, [name] in Treatment: 4 Vital Signs Height(in): 56 Pulse(bpm): 96 Weight(lbs): 185 Blood Pressure(mmHg): 161/85 Body Mass Index(BMI): 28.1 Temperature(F): 97.9 Respiratory Rate(breaths/min): 18 Photos: [1:Right Achilles] [2:Left, Plantar Foot] [N/A:N/A N/A] Wound Location: [1:Surgical Injury] [2:Gradually Appeared] [N/A:N/A] Wounding Event: [1:Diabetic Wound/Ulcer of the Lower] [2:Diabetic Wound/Ulcer of the Lower] [N/A:N/A] Primary Etiology: [1:Extremity Sleep Apnea, Arrhythmia, Congestive Sleep Apnea, Arrhythmia, Congestive N/A] [2:Extremity] Comorbid History: [1:Heart Failure, Hypertension, Peripheral Heart Failure, Hypertension, Peripheral Arterial Disease, Type II Diabetes 07/16/2022] [2:Arterial Disease, Type II Diabetes 03/09/2022] [N/A:N/A] Date Acquired: [1:4] [2:4] [N/A:N/A] Weeks of  Treatment: [1:Open] [2:Open] [N/A:N/A] Wound Status: [1:No] [2:No] [N/A:N/A] Wound Recurrence: [1:6.7x3x0.3] [2:1x1.8x0.4] [N/A:N/A] Measurements L x W x D (cm) [1:15.787] [2:1.414] [N/A:N/A] A (cm) : rea [1:4.736] [2:0.565] [N/A:N/A] Volume (cm) : [1:22.70%] [2:-542.70%] [N/A:N/A] % Reduction in A rea: [1:53.60%] [2:-756.10%] [N/A:N/A] % Reduction in Volume: [1:Grade 2] [2:Grade 2] [N/A:N/A] Classification: [1:Large] [2:Medium] [N/A:N/A] Exudate A mount: [1:Serosanguineous] [2:Serosanguineous] [N/A:N/A] Exudate Type: [1:red, brown] [2:red, brown] [N/A:N/A] Exudate Color: [1:Yes] [2:No] [N/A:N/A] Foul Odor A Cleansing: [1:fter No] [2:N/A] [N/A:N/A] Odor A nticipated Due to Product Use: [1:Distinct, outline attached] [2:Distinct, outline attached] [N/A:N/A] Wound Margin: [1:Large (67-100%)] [2:Large (67-100%)] [N/A:N/A] Granulation A mount: [1:Red, Pink] [2:Pink, Pale] [N/A:N/A] Granulation Quality: [1:Small (1-33%)] [2:Small (1-33%)] [N/A:N/A] Necrotic A mount: [1:Fat Layer (Subcutaneous Tissue): Yes Fat Layer (Subcutaneous Tissue): Yes N/A] Exposed  Structures: [1:Tendon: Yes Fascia: No Muscle: No Joint: No Bone: No Small (1-33%)] [2:Fascia: No Tendon: No Muscle: No Joint: No Bone: No None] [N/A:N/A] Epithelialization: [1:Debridement - Excisional] [2:Debridement - Excisional] [N/A:N/A] Debridement: Pre-procedure Verification/Time Out 15:59 [2:15:59] [N/A:N/A] Taken: [1:Lidocaine 4% Topical Solution] [2:Lidocaine 4% Topical Solution] [N/A:N/A] Pain Control: [1:Subcutaneous, Slough] [2:Subcutaneous, Slough] [N/A:N/A] Tissue Debrided: [1:Skin/Subcutaneous Tissue] [2:Skin/Subcutaneous Tissue] [N/A:N/A] Level: [1:20.1] [2:1.8] [N/A:N/A] Debridement A (sq cm): [1:rea Curette] [2:Curette] [N/A:N/A] Instrument: [1:Minimum] [2:Minimum] [N/A:N/A] Bleeding: [1:Pressure] [2:Pressure] [N/A:N/A] Hemostasis A chieved: [1:Procedure was tolerated well] [2:Procedure was tolerated well] [N/A:N/A] Debridement Treatment Response: [1:6.7x3x0.3] [2:1x1.8x0.4] [N/A:N/A] Post Debridement Measurements L x W x D (cm) [1:4.736] [2:0.565] [N/A:N/A] Post Debridement Volume: (cm) [1:Excoriation: No] [2:Excoriation: No] [N/A:N/A] Periwound Skin Texture: [1:Induration: No Callus: No Crepitus: No Rash: No Scarring: No Maceration: No] [2:Induration: No Callus: No Crepitus: No Rash: No Scarring: No Maceration: No] [N/A:N/A] Periwound Skin Moisture: [1:Dry/Scaly: No Atrophie Blanche: No] [2:Dry/Scaly: No Hemosiderin Staining: Yes] [N/A:N/A] Periwound Skin Color: [1:Cyanosis: No Ecchymosis: No Erythema: No Hemosiderin Staining: No Mottled: No Pallor: No Rubor: No No Abnormality] [2:Atrophie Blanche: No Cyanosis: No Ecchymosis: No Erythema: No Mottled: No Pallor: No Rubor: No No Abnormality] [N/A:N/A] Temperature: [1:Debridement] [2:Debridement] [N/A:N/A] Treatment Notes Wound #1 (Achilles) Wound Laterality: Right Cleanser Dakins Solution Discharge Instruction: Clean with Dakins Peri-Wound Care Sween Lotion (Moisturizing lotion) Discharge Instruction: Apply  moisturizing lotion as directed Topical Primary Dressing Hydrofera Blue Classic Foam, 4x4 in Discharge Instruction: Moisten with saline prior to applying to wound bed Santyl Ointment Discharge Instruction: Apply nickel thick amount to wound bed as instructed Secondary Dressing ABD Pad, 5x9 Discharge Instruction: Apply over primary dressing as directed. Woven Gauze Sponge, Non-Sterile 4x4 in Discharge Instruction: Apply over primary dressing as directed. Secured With Compression Wrap Kerlix Roll 4.5x3.1 (in/yd) Discharge Instruction: Apply Kerlix and Coban compression as directed. Coban Self-Adherent Wrap 4x5 (in/yd) Discharge Instruction: Apply over Kerlix as directed. Compression Stockings Add-Ons Wound #2 (Foot) Wound Laterality: Plantar, Left Cleanser Dakins Solution Peri-Wound Care Skin Prep Discharge Instruction: Use skin prep as directed Topical Primary Dressing KerraCel Ag Gelling Fiber Dressing, 4x5 in (silver alginate) Discharge Instruction: Apply silver alginate to wound bed as instructed Secondary Dressing Felt 2.5 yds x 5.5 in Discharge Instruction: Apply as donut in shoe Woven Gauze Sponges 2x2 in Discharge Instruction: Apply over primary dressing as directed. Secured With [employer], 4.5x3.1 (in/yd) Discharge Instruction: Secure with Kerlix as directed. 41M Medipore H Soft Cloth Surgical T ape, 4 x 10 (in/yd) Discharge Instruction: Secure with tape as directed. Compression Wrap Compression Stockings Add-Ons Electronic Signature(s) Signed: 08/14/2022 9:13:22 AM By: [name] DO Entered By: [name] on 08/14/2022 09:06:28 -------------------------------------------------------------------------------- Multi-Disciplinary Care Plan Details Patient Name: Date of  Service: Jenetta Downer DRO [name] [facility] A. 08/13/2022 3:30 PM Medical Record Number: [id number] Patient Account Number: [id number] Date of Birth/Sex: Treating RN: [birth year] (71 y.o. [name] Primary Care Ramey Ketcherside: [name] Other Clinician: Referring Arvil Utz: Treating Raphael Fitzpatrick/Extender: [name] Ngetich, [name] in Treatment: 4 Active Inactive Nutrition Nursing Diagnoses: Potential for alteratiion in Nutrition/Potential for imbalanced nutrition Goals: Patient/caregiver agrees to and verbalizes understanding of need to use nutritional supplements and/or vitamins as prescribed Date Initiated: 07/16/2022 Target Resolution Date: 09/17/2022 Goal Status: Active Patient/caregiver will maintain therapeutic glucose control Date Initiated: 07/16/2022 Target Resolution Date: 09/17/2022 Goal Status: Active Interventions: Assess HgA1c results as ordered upon admission and as needed Provide education on nutrition Treatment Activities: Education provided on Nutrition : 07/23/2022 Obtain HgA1c : 07/16/2022 Patient referred to Primary Care Physician for further nutritional evaluation : 07/16/2022 Notes: Pain, Acute or Chronic Nursing Diagnoses: Pain, acute or chronic: actual or potential Potential alteration in comfort, pain Goals: Patient will verbalize adequate pain control and receive pain control interventions during procedures as needed Date Initiated: 07/16/2022 Target Resolution Date: 09/17/2022 Goal Status: Active Interventions: Encourage patient to take pain medications as prescribed Provide education on pain management Reposition patient for comfort Treatment Activities: Administer pain control measures as ordered : 07/16/2022 Notes: Wound/Skin Impairment Nursing Diagnoses: Knowledge deficit related to ulceration/compromised skin integrity Goals: Patient/caregiver will verbalize understanding of skin care regimen Date Initiated: 07/16/2022 Target Resolution Date: 09/17/2022 Goal Status: Active Interventions: Assess patient/caregiver ability to perform ulcer/skin care regimen upon admission and as needed Assess ulceration(s) every  visit Provide education on ulcer and skin care Treatment Activities: Skin care regimen initiated : 07/16/2022 Topical wound management initiated : 07/16/2022 Notes: Electronic Signature(s) Signed: 08/13/2022 4:56:32 PM By: Lorrin [name] Entered By: Lorrin [name] on 08/13/2022 15:47:49 -------------------------------------------------------------------------------- Pain Assessment Details Patient Name: Date of Service: [place], [place] A. 08/13/2022 3:30 PM Medical Record Number: [id number] Patient Account Number: [id number] Date of Birth/Sex: Treating RN: 15-Aug-1951 (71 y.o. [name] Primary Care Kyron Schlitt: [name] Other Clinician: Referring Abrahim Sargent: Treating Jamielyn Petrucci/Extender: [name] Ngetich, [name] in Treatment: 4 Active Problems Location of Pain Severity and Description of Pain Patient Has Paino No Site Locations Pain Management and Medication Current Pain Management: Electronic Signature(s) Signed: 08/13/2022 4:56:32 PM By: Lorrin [name] Entered By: Lorrin [name] on 08/13/2022 15:56:33 -------------------------------------------------------------------------------- Patient/Caregiver Education Details Patient Name: Date of Service: Donnamarie Rossetti 10/5/2023andnbsp3:30 PM Medical Record Number: [id number] Patient Account Number: [id number] Date of Birth/Gender: Treating RN: [birth year] (71 y.o. [name] Primary Care Physician: [name] Other Clinician: Referring Physician: Treating Physician/Extender: [name] in Treatment: 4 Education Assessment Education Provided To: Patient Education Topics Provided Elevated Blood Sugar/ Impact on Healing: Methods: Explain/Verbal, Printed Responses: State content correctly Venous: Methods: Explain/Verbal, Printed Responses: State content correctly Wound/Skin Impairment: Methods: Explain/Verbal, Printed Responses: State content  correctly Electronic Signature(s) Signed: 08/13/2022 4:56:32 PM By: Lorrin [name] Entered By: Lorrin [name] on 08/13/2022 15:48:16 -------------------------------------------------------------------------------- Wound Assessment Details Patient Name: Date of Service: [name] [facility] A. 08/13/2022 3:30 PM Medical Record Number: [id number] Patient Account Number: [id number] Date of Birth/Sex: Treating RN: [birth year] (71 y.o. [name] Primary Care Brookes Craine: [name] Other Clinician: Referring Jaydyn Bozzo: Treating Inessa Wardrop/Extender: [name] Ngetich, [name] in Treatment: 4 Wound Status Wound Number: 1 Primary Diabetic Wound/Ulcer of the Lower Extremity Etiology: Wound Location: Right Achilles Wound Open Wounding Event: Surgical Injury Status: Date Acquired: 07/16/2022 Comorbid Sleep Apnea, Arrhythmia, Congestive Heart  Failure, Hypertension, Weeks Of Treatment: 4 History: Peripheral Arterial Disease, Type II Diabetes Clustered Wound: No Photos Wound Measurements Length: (cm) 6.7 Width: (cm) 3 Depth: (cm) 0.3 Area: (cm) 15.787 Volume: (cm) 4.736 % Reduction in Area: 22.7% % Reduction in Volume: 53.6% Epithelialization: Small (1-33%) Tunneling: No Undermining: No Wound Description Classification: Grade 2 Wound Margin: Distinct, outline attached Exudate Amount: Large Exudate Type: Serosanguineous Exudate Color: red, brown Foul Odor After Cleansing: Yes Due to Product Use: No Slough/Fibrino Yes Wound Bed Granulation Amount: Large (67-100%) Exposed Structure Granulation Quality: Red, Pink Fascia Exposed: No Necrotic Amount: Small (1-33%) Fat Layer (Subcutaneous Tissue) Exposed: Yes Necrotic Quality: Adherent Slough Tendon Exposed: Yes Muscle Exposed: No Joint Exposed: No Bone Exposed: No Periwound Skin Texture Texture Color No Abnormalities Noted: Yes No Abnormalities Noted: No Atrophie Blanche: No Moisture Cyanosis: No No  Abnormalities Noted: Yes Ecchymosis: No Erythema: No Hemosiderin Staining: No Mottled: No Pallor: No Rubor: No Temperature / Pain Temperature: No Abnormality Treatment Notes Wound #1 (Achilles) Wound Laterality: Right Cleanser Dakins Solution Discharge Instruction: Clean with Dakins Peri-Wound Care Sween Lotion (Moisturizing lotion) Discharge Instruction: Apply moisturizing lotion as directed Topical Primary Dressing Hydrofera Blue Classic Foam, 4x4 in Discharge Instruction: Moisten with saline prior to applying to wound bed Santyl Ointment Discharge Instruction: Apply nickel thick amount to wound bed as instructed Secondary Dressing ABD Pad, 5x9 Discharge Instruction: Apply over primary dressing as directed. Woven Gauze Sponge, Non-Sterile 4x4 in Discharge Instruction: Apply over primary dressing as directed. Secured With Compression Wrap Kerlix Roll 4.5x3.1 (in/yd) Discharge Instruction: Apply Kerlix and Coban compression as directed. Coban Self-Adherent Wrap 4x5 (in/yd) Discharge Instruction: Apply over Kerlix as directed. Compression Stockings Add-Ons Electronic Signature(s) Signed: 08/13/2022 4:56:32 PM By: Lorrin [name] Entered By: Lorrin [name] on 08/13/2022 16:03:18 -------------------------------------------------------------------------------- Wound Assessment Details Patient Name: Date of Service: [name] [name] [facility] A. 08/13/2022 3:30 PM Medical Record Number: [id number] Patient Account Number: [id number] Date of Birth/Sex: Treating RN: 1951-04-15 (71 y.o. [name] Primary Care Kaianna Dolezal: [name] Other Clinician: Referring Romona Murdy: Treating Kaylynne Andres/Extender: [name] Ngetich, [name] in Treatment: 4 Wound Status Wound Number: 2 Primary Diabetic Wound/Ulcer of the Lower Extremity Etiology: Wound Location: Left, Plantar Foot Wound Open Wounding Event: Gradually Appeared Status: Date Acquired: 03/09/2022 Comorbid Sleep  Apnea, Arrhythmia, Congestive Heart Failure, Hypertension, Weeks Of Treatment: 4 History: Peripheral Arterial Disease, Type II Diabetes Clustered Wound: No Photos Wound Measurements Length: (cm) 1 Width: (cm) 1.8 Depth: (cm) 0.4 Area: (cm) 1.414 Volume: (cm) 0.565 % Reduction in Area: -542.7% % Reduction in Volume: -756.1% Epithelialization: None Tunneling: No Undermining: No Wound Description Classification: Grade 2 Wound Margin: Distinct, outline attached Exudate Amount: Medium Exudate Type: Serosanguineous Exudate Color: red, brown Foul Odor After Cleansing: No Slough/Fibrino Yes Wound Bed Granulation Amount: Large (67-100%) Exposed Structure Granulation Quality: Pink, Pale Fascia Exposed: No Necrotic Amount: Small (1-33%) Fat Layer (Subcutaneous Tissue) Exposed: Yes Necrotic Quality: Adherent Slough Tendon Exposed: No Muscle Exposed: No Joint Exposed: No Bone Exposed: No Periwound Skin Texture Texture Color No Abnormalities Noted: Yes No Abnormalities Noted: No Atrophie Blanche: No Moisture Cyanosis: No No Abnormalities Noted: Yes Ecchymosis: No Erythema: No Hemosiderin Staining: Yes Mottled: No Pallor: No Rubor: No Temperature / Pain Temperature: No Abnormality Treatment Notes Wound #2 (Foot) Wound Laterality: Plantar, Left Cleanser Dakins Solution Peri-Wound Care Skin Prep Discharge Instruction: Use skin prep as directed Topical Primary Dressing KerraCel Ag Gelling Fiber Dressing, 4x5 in (silver alginate) Discharge Instruction: Apply silver alginate to wound bed as instructed  Secondary Dressing Felt 2.5 yds x 5.5 in Discharge Instruction: Apply as donut in shoe Woven Gauze Sponges 2x2 in Discharge Instruction: Apply over primary dressing as directed. Secured With [employer], 4.5x3.1 (in/yd) Discharge Instruction: Secure with Kerlix as directed. 77M Medipore H Soft Cloth Surgical T ape, 4 x 10 (in/yd) Discharge Instruction: Secure  with tape as directed. Compression Wrap Compression Stockings Add-Ons Electronic Signature(s) Signed: 08/13/2022 4:56:32 PM By: Lorrin [name] Entered By: Lorrin [name] on 08/13/2022 16:03:44 -------------------------------------------------------------------------------- Vitals Details Patient Name: Date of Service: [name] 08/13/2022 3:30 PM Medical Record Number: [id number] Patient Account Number: [id number] Date of Birth/Sex: Treating RN: [birth year] (71 y.o. [name] Primary Care Priest Lockridge: [name] Other Clinician: Referring Sharai Overbay: Treating Lj Miyamoto/Extender: [name] Ngetich, [name] in Treatment: 4 Vital Signs Time Taken: 15:52 Temperature (F): 97.9 Height (in): 68 Pulse (bpm): 87 Weight (lbs): 185 Respiratory Rate (breaths/min): 18 Body Mass Index (BMI): 28.1 Blood Pressure (mmHg): 161/85 Reference Range: 80 - 120 mg / dl Notes patient doesn't check blood sugar Electronic Signature(s) Signed: 08/13/2022 4:56:32 PM By: Lorrin [name] Entered By: Lorrin [name] on 08/13/2022 15:53:50

## 2022-08-18 ENCOUNTER — Ambulatory Visit (INDEPENDENT_AMBULATORY_CARE_PROVIDER_SITE_OTHER): Payer: Medicare HMO | Admitting: Vascular Surgery

## 2022-08-18 ENCOUNTER — Encounter: Payer: Self-pay | Admitting: Vascular Surgery

## 2022-08-18 VITALS — BP 163/88 | HR 64 | Temp 98.0°F | Resp 16 | Ht 68.0 in | Wt 206.0 lb

## 2022-08-18 DIAGNOSIS — I70223 Atherosclerosis of native arteries of extremities with rest pain, bilateral legs: Secondary | ICD-10-CM

## 2022-08-18 NOTE — Progress Notes (Signed)
Patient name: [name] MRN: [id number] DOB: 15-Dec-1950 Sex: male  REASON FOR CONSULT: 1 month follow-up for wound check   HPI: [name] is a 71 y.o. male, with history of atrial fibrillation, congestive heart failure, CKD, diabetes, hypertension, PVD that presents for 1 month follow-up for wound check.  He recent underwent angiogram on 06/11/22 with right PT angioplasty requiring retrograde pedal access for CLI with tissue loss.  Wound is now being followed by the wound clinic.  The daughter states that this has made significant progress.  They have been placing compression wraps on it now and he gets weekly changes in the wound clinic.  He has previously undergone left peroneal angioplasty on 05/19/2021 for left fifth toe gangrene.  Past Medical History:  Diagnosis Date   A-fib [facility])    a. (06/04/14) TEE-DC-CV; succesful; large LA 6.2 cm   ADHD    Ascites    Athlete's foot    Chronic systolic heart failure ([place])    a. 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, unspecified CKD stage, unspecified whether long term insulin use (HCC)    Heart murmur    HTN (hypertension)    OSA (obstructive sleep apnea)    PVD (peripheral vascular disease) (HCC)    s/p great toe amputation    Past Surgical History:  Procedure Laterality Date   ABDOMINAL AORTOGRAM W/LOWER EXTREMITY N/A 05/19/2021   Procedure: ABDOMINAL AORTOGRAM W/LOWER EXTREMITY;  Surgeon: Marty Heck, MD;  Location: [place] [facility];  Service: Cardiovascular;  Laterality: N/A;   ABDOMINAL AORTOGRAM W/LOWER EXTREMITY Right 06/11/2022   Procedure: ABDOMINAL AORTOGRAM W/LOWER EXTREMITY;  Surgeon: Marty Heck, MD;  Location: [place] [facility];  Service: Cardiovascular;  Laterality: Right;   AMPUTATION  Left 05/22/2021   Procedure: LEFT FIFTH TOE AMPUTATION;  Surgeon: Marty Heck, MD;  Location: [place];  Service: Vascular;  Laterality: Left;   CARDIOVERSION N/A 06/04/2014   Procedure: CARDIOVERSION;  Surgeon: Larey Dresser, MD;  Location: [facility] ENDOSCOPY;  Service: Cardiovascular;  Laterality: N/A;   CARDIOVERSION N/A 11/06/2021   Procedure: CARDIOVERSION;  Surgeon: Larey Dresser, MD;  Location: [facility] ENDOSCOPY;  Service: Cardiovascular;  Laterality: N/A;   NOSE SURGERY     Nasal septum surgery   PERIPHERAL VASCULAR BALLOON ANGIOPLASTY  06/11/2022   Procedure: PERIPHERAL VASCULAR BALLOON ANGIOPLASTY;  Surgeon: Marty Heck, MD;  Location: [place] [facility];  Service: Cardiovascular;;   PERIPHERAL VASCULAR INTERVENTION Left 05/19/2021   Procedure: PERIPHERAL VASCULAR INTERVENTION;  Surgeon: Marty Heck, MD;  Location: [place] [facility];  Service: Cardiovascular;  Laterality: Left;   TEE WITHOUT CARDIOVERSION N/A 06/04/2014   Procedure: TRANSESOPHAGEAL ECHOCARDIOGRAM (TEE);  Surgeon: Larey Dresser, MD;  Location: [place];  Service: Cardiovascular;  Laterality: N/A;   TEE WITHOUT CARDIOVERSION N/A 01/06/2016   Procedure: TRANSESOPHAGEAL ECHOCARDIOGRAM (TEE);  Surgeon: Fay Records, MD;  Location: [place];  Service: Cardiovascular;  Laterality: N/A;   TEE WITHOUT CARDIOVERSION N/A 11/06/2021   Procedure: TRANSESOPHAGEAL ECHOCARDIOGRAM (TEE);  Surgeon: Larey Dresser, MD;  Location: [facility] ENDOSCOPY;  Service: Cardiovascular;  Laterality: N/A;   VASECTOMY      Family History  Problem Relation Age of Onset   Heart attack Mother  deceased   Diabetes Mother    Heart attack Sister     SOCIAL HISTORY: Social History   Socioeconomic History   Marital status: Single    Spouse name: Not on file   Number of children: Not on file   Years of education: Not on file   Highest education level: Not on file  Occupational History   Occupation: retired  Tobacco  Use   Smoking status: Some Days    Years: 52.00    Types: Cigarettes, Cigars    Last attempt to quit: 05/2021    Years since quitting: 1.2    Passive exposure: Never   Smokeless tobacco: Never  Vaping Use   Vaping Use: Never used  Substance and Sexual Activity   Alcohol use: Not Currently   Drug use: Never   Sexual activity: Not on file  Other Topics Concern   Not on file  Social History Narrative   ** Merged History Encounter ** Lives in [place] by himself. Retired from [employer] and [employer] for [employer].       Tobacco use, amount per day now:   Past tobacco use, amount per day:   How many years did you use tobacco:   Alcohol use (drinks per week): N/A   Diet:   Do you drink/eat things with caffeine:   Marital status:  Divorced                                What year were you married? 2003   Do you live in a house, apartment, assisted living, condo, trailer, etc.? House   Is it one or more stories? One   How many persons live in your home?   Do you have pets in your home?( please list) N/A   Highest Level of education completed? Bachelors Degree   Current or past profession: Teacher-Special Ed   Do you exercise?  Yes                                Type and how often? Daily squats, and push ups.   Do you have a living will? No   Do you have a DNR form?  No                                 If not, do you want to discuss one?   Do you have signed POA/HPOA forms?  No                      If so, please bring to you appointment      Do you have any difficulty bathing or dressing yourself? Bathing only if pants not shirts/not shorts.   Do you have any difficulty preparing food or eating? No   Do you have any difficulty managing your medications? No   Do you have any difficulty managing your finances? No   Do you have any difficulty affording your medications? No   Social Determinants of Health   Financial Resource Strain: Not on file  Food Insecurity: No Food  Insecurity (11/10/2021)   Hunger Vital Sign    Worried About Running Out of Food in the Last Year: Never true    Ran Out of Food in the Last Year: Never true  Transportation  Needs: No Transportation Needs (11/10/2021)   PRAPARE - Hydrologist (Medical): No    Lack of Transportation (Non-Medical): No  Physical Activity: Not on file  Stress: Not on file  Social Connections: Not on file  Intimate Partner Violence: Not on file    No Known Allergies  Current Outpatient Medications  Medication Sig Dispense Refill   APPLE CIDER VINEGAR PO Take 30 mLs by mouth 4 (four) times a week.     clopidogrel (PLAVIX) 75 MG tablet Take 1 tablet (75 mg total) by mouth daily. 30 tablet 11   diclofenac Sodium (VOLTAREN) 1 % GEL Apply 1 Application topically 4 (four) times daily as needed (pain).     empagliflozin (JARDIANCE) 10 MG TABS tablet TAKE 1 TABLET(10 MG) BY MOUTH DAILY 30 tablet 5   ENTRESTO 49-51 MG TAKE 1 TABLET BY MOUTH TWICE DAILY 180 tablet 2   furosemide (LASIX) 20 MG tablet TAKE 1 TABLET(20 MG) BY MOUTH TWICE DAILY 90 tablet 0   insulin glargine (LANTUS) 100 UNIT/ML injection INJECT 8 UNITS UNDER THE SKIN DAILY 10 mL 11   Insulin Syringe-Needle U-100 (INSULIN SYRINGE 1CC/30GX5/16") 30G X 5/16" 1 ML MISC Use once daily for insulin injections. Dx:E11.22 100 each 1   methocarbamol (ROBAXIN) 500 MG tablet Take 1 tablet (500 mg total) by mouth every 8 (eight) hours as needed for muscle spasms. 50 tablet 1   metoprolol succinate (TOPROL XL) 25 MG 24 hr tablet Take 1 tablet (25 mg total) by mouth daily. 30 tablet 11   Misc Natural Products (JOINT SUPPORT PO) Take 1 Dose by mouth 3 (three) times a week.     spironolactone (ALDACTONE) 25 MG tablet Take 0.5 tablets (12.5 mg total) by mouth daily. 45 tablet 3   traMADol (ULTRAM) 50 MG tablet Take 1 tablet (50 mg total) by mouth every 6 (six) hours as needed for moderate pain. 20 tablet 0   warfarin (COUMADIN) 6 MG tablet Take  0.5-1 tablets (3-6 mg total) by mouth See admin instructions. Take 6 mg on Mon and Fri Take 3 mg on Sun, Tues, Wed, Thurs, and Sat 30 tablet 6   blood glucose meter kit and supplies Dispense based on patient and insurance preference. Use up to four times daily as directed. (FOR ICD-10 E10.9, E11.9). (Patient not taking: Reported on 08/06/2022) 1 each 0   No current facility-administered medications for this visit.    REVIEW OF SYSTEMS:  '[X]'  denotes positive finding, '[ ]'  denotes negative finding Cardiac  Comments:  Chest pain or chest pressure:    Shortness of breath upon exertion:    Short of breath when lying flat:    Irregular heart rhythm:        Vascular    Pain in calf, thigh, or hip brought on by ambulation:    Pain in feet at night that wakes you up from your sleep:     Blood clot in your veins:    Leg swelling:         Pulmonary    Oxygen at home:    Productive cough:     Wheezing:         Neurologic    Sudden weakness in arms or legs:     Sudden numbness in arms or legs:     Sudden onset of difficulty speaking or slurred speech:    Temporary loss of vision in one eye:     Problems with dizziness:  Gastrointestinal    Blood in stool:     Vomited blood:         Genitourinary    Burning when urinating:     Blood in urine:        Psychiatric    Major depression:         Hematologic    Bleeding problems:    Problems with blood clotting too easily:        Skin    Rashes or ulcers:        Constitutional    Fever or chills:      PHYSICAL EXAM: Vitals:   08/18/22 1525  BP: (!) 163/88  Pulse: 64  Resp: 16  Temp: 98 F (36.7 C)  TempSrc: Temporal  SpO2: 95%  Weight: 206 lb (93.4 kg)  Height: '5\' 8"'  (1.727 m)    GENERAL: The patient is a well-nourished male, in no acute distress. The vital signs are documented above. CARDIAC: There is a regular rate and rhythm.  VASCULAR:  Left foot wrapped with compression dressing (family did not want this  removed today) PULMONARY: No respiratory distress.  DATA:   Right leg arterial duplex9/12/23 shows right PT intervention patent  Pulsatile toe tracings bilaterally with toe pressure 73 right and 68 left  Assessment/Plan:  71 year old male underwent right lower extremity arteriogram on 06/11/22 requiring retrograde right PT angioplasty that presents for 1 month follow-up and wound check.  He currently has a multilayered compression dressing in place that is being changed weekly by the wound clinic.  The daughter states the right foot wound is showing significant progress in the wound clinic and they are very happy with it.  Discussed with continued signs of healing we will arrange follow-up in 3 months with noninvasive imaging for close interval surveillance.  Marty Heck, MD [facility] Office: [phone]

## 2022-08-19 ENCOUNTER — Ambulatory Visit: Payer: Medicare HMO

## 2022-08-20 ENCOUNTER — Encounter (HOSPITAL_BASED_OUTPATIENT_CLINIC_OR_DEPARTMENT_OTHER): Payer: Medicare HMO | Admitting: Internal Medicine

## 2022-08-20 DIAGNOSIS — L97813 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: Secondary | ICD-10-CM

## 2022-08-20 DIAGNOSIS — L97518 Non-pressure chronic ulcer of other part of right foot with other specified severity: Secondary | ICD-10-CM | POA: Diagnosis not present

## 2022-08-20 DIAGNOSIS — Z794 Long term (current) use of insulin: Secondary | ICD-10-CM | POA: Diagnosis not present

## 2022-08-20 DIAGNOSIS — E1140 Type 2 diabetes mellitus with diabetic neuropathy, unspecified: Secondary | ICD-10-CM | POA: Diagnosis not present

## 2022-08-20 DIAGNOSIS — L97522 Non-pressure chronic ulcer of other part of left foot with fat layer exposed: Secondary | ICD-10-CM | POA: Diagnosis not present

## 2022-08-20 DIAGNOSIS — E11621 Type 2 diabetes mellitus with foot ulcer: Secondary | ICD-10-CM | POA: Diagnosis not present

## 2022-08-20 DIAGNOSIS — I110 Hypertensive heart disease with heart failure: Secondary | ICD-10-CM | POA: Diagnosis not present

## 2022-08-20 DIAGNOSIS — I5022 Chronic systolic (congestive) heart failure: Secondary | ICD-10-CM | POA: Diagnosis not present

## 2022-08-20 DIAGNOSIS — E1151 Type 2 diabetes mellitus with diabetic peripheral angiopathy without gangrene: Secondary | ICD-10-CM | POA: Diagnosis not present

## 2022-08-20 NOTE — Progress Notes (Signed)
KRIDAY, SEMPER [name] [id number] Page 1 of 10 Visit Report for 08/20/2022 Arrival Information Details Patient Name: Date of Service: [name]. 08/20/2022 3:00 PM Medical Record Number: [id number] Patient Account Number: [id number] Date of Birth/Sex: Treating RN: 02-26-1951 (71 y.Darren. [name] Primary Care [name]: [name] Other Clinician: Referring [name]: Treating [name]: [name], [name] in Treatment: 5 Visit Information History Since Last Visit All ordered tests and consults were completed: Yes Patient Arrived: [name] Added or deleted any medications: No Arrival Time: 15:21 Any new allergies or adverse reactions: No Accompanied By: daughter Had [name] fall or experienced change in No Transfer Assistance: None activities of daily living that may affect Patient Identification Verified: Yes risk of falls: Secondary Verification Process Completed: Yes Signs or symptoms of abuse/neglect since last visito No Patient Requires Transmission-Based Precautions: No Hospitalized since last visit: No Patient Has Alerts: Yes Implantable device outside of the clinic excluding No Patient Alerts: Patient on Blood Thinner cellular tissue based products placed in the center since last visit: Has Dressing in Place as Prescribed: No Has Compression in Place as Prescribed: No Pain Present Now: No Notes patient late to appt again today. Electronic Signature(s) Signed: 08/20/2022 4:52:38 PM By: Darren Pilling RN, BSN Entered By: [name] on 08/20/2022 15:27:04 -------------------------------------------------------------------------------- Encounter Discharge Information Details Patient Name: Date of Service: [name]. 08/20/2022 3:00 PM Medical Record Number: [id number] Patient Account Number: [id number] Date of Birth/Sex: Treating RN: 07/10/1951 (28 y.Darren. [name] Primary Care  [name]: [name] Other Clinician: Referring [name]: Treating [name]/Extender: [name], [name] in Treatment: 5 Encounter Discharge Information Items Post Procedure Vitals Discharge Condition: Stable Temperature (F): 98.5 Ambulatory Status: Cane Pulse (bpm): 76 Discharge Destination: Home Respiratory Rate (breaths/min): 20 Transportation: Private Auto Blood Pressure (mmHg): 161/92 Accompanied By: daughter Schedule Follow-up Appointment: Yes Clinical Summary of Care: Electronic Signature(s) Signed: 08/20/2022 4:52:38 PM By: Darren Pilling RN, BSN Entered By: [name] on 08/20/2022 16:11:13 [name] [id number] Page 2 of 10 -------------------------------------------------------------------------------- Lower Extremity Assessment Details Patient Name: Date of Service: [name], [name]. 08/20/2022 3:00 PM Medical Record Number: [id number] Patient Account Number: [id number] Date of Birth/Sex: Treating RN: [birth year] (71 y.Darren. [name] Primary Care [name]: [name] Other Clinician: Referring [name]: Treating [name]: [name], [name] in Treatment: 5 Edema Assessment Assessed: [Left: No] [Right: Yes] Edema: [Left: Yes] [Right: No] Calf Left: Right: Point of Measurement: 39 cm From Medial Instep 38 cm Ankle Left: Right: Point of Measurement: 8 cm From Medial Instep 24.5 cm Vascular Assessment Pulses: Dorsalis Pedis Palpable: [Right:Yes] Electronic Signature(s) Signed: 08/20/2022 4:52:38 PM By: Darren Pilling RN, BSN Entered By: [name] on 08/20/2022 15:30:49 -------------------------------------------------------------------------------- Multi Wound Chart Details Patient Name: Date of Service: [name]. 08/20/2022 3:00 PM Medical Record Number: [id number] Patient Account Number: [id number] Date of Birth/Sex: Treating  RN: 1951-04-04 (71 y.Darren. [name] Primary Care Dale Strausser: [name] Other Clinician: Referring [name]: Treating [name]: [name], [name] in Treatment: 5 Vital Signs Height(in): 68 Pulse(bpm): 76 Weight(lbs): 185 Blood Pressure(mmHg): 161/92 Body Mass Index(BMI): 28.1 Temperature(F): 98.5 Respiratory Rate(breaths/min): 20 [1:Photos:] [N/[name]:N/[name] [id number] Page 3 of 10] Right Achilles Left, Plantar Foot N/[name] Wound Location: Surgical Injury Gradually Appeared N/[name] Wounding Event: Diabetic Wound/Ulcer of the Lower Diabetic Wound/Ulcer of the Lower N/[name] Primary Etiology: Extremity Extremity Sleep Apnea, Arrhythmia, Congestive Sleep Apnea, Arrhythmia, Congestive N/[name] Comorbid History:  Heart Failure, Hypertension, Peripheral Heart Failure, Hypertension, Peripheral Arterial Disease, Type II Diabetes Arterial Disease, Type II Diabetes 07/16/2022 03/09/2022 N/[name] Date Acquired: 5 5 N/[name] [name] of Treatment: Open Open N/[name] Wound Status: No No N/[name] Wound Recurrence: 6.2x2.7x0.2 0.9x0.4x0.4 N/[name] Measurements L x W x D (cm) 13.148 0.283 N/[name] [name] (cm) : rea 2.63 0.113 N/[name] Volume (cm) : 35.60% -28.60% N/[name] % Reduction in [name] rea: 74.20% -71.20% N/[name] % Reduction in Volume: 8 Starting Position 1 (Darren'clock): 11 Ending Position 1 (Darren'clock): 0.8 Maximum Distance 1 (cm): No Yes N/[name] Undermining: Grade 2 Grade 2 N/[name] Classification: Medium Medium N/[name] Exudate [name] mount: Serosanguineous Serosanguineous N/[name] Exudate Type: red, brown red, brown N/[name] Exudate Color: Distinct, outline attached Thickened N/[name] Wound Margin: Large (67-100%) Large (67-100%) N/[name] Granulation [name] mount: Red, Pink Pink, Pale N/[name] Granulation Quality: Small (1-33%) None Present (0%) N/[name] Necrotic [name] mount: Fat Layer (Subcutaneous Tissue): Yes Fat Layer (Subcutaneous Tissue): Yes N/[name] Exposed Structures: Tendon: Yes Fascia: No Fascia: No Tendon: No Muscle:  No Muscle: No Joint: No Joint: No Bone: No Bone: No Medium (34-66%) None N/[name] Epithelialization: Debridement - Excisional Debridement - Excisional N/[name] Debridement: Pre-procedure Verification/Time Out 16:00 16:00 N/[name] Taken: Lidocaine 5% topical ointment Lidocaine 5% topical ointment N/[name] Pain Control: Callus, Subcutaneous, Slough Callus, Subcutaneous, Slough N/[name] Tissue Debrided: Skin/Subcutaneous Tissue Skin/Subcutaneous Tissue N/[name] Level: 16.74 2 N/[name] Debridement [name] (sq cm): rea Curette Curette N/[name] Instrument: Minimum Minimum N/[name] Bleeding: Pressure Pressure N/[name] Hemostasis [name] chieved: 0 0 N/[name] Procedural Pain: 0 0 N/[name] Post Procedural Pain: Procedure was tolerated well Procedure was tolerated well N/[name] Debridement Treatment Response: 6.2x2.7x0.2 0.9x0.4x0.4 N/[name] Post Debridement Measurements L x W x D (cm) 2.63 0.113 N/[name] Post Debridement Volume: (cm) Excoriation: No Callus: Yes N/[name] Periwound Skin Texture: Induration: No Excoriation: No Callus: No Induration: No Crepitus: No Crepitus: No Rash: No Rash: No Scarring: No Scarring: No Dry/Scaly: Yes Maceration: Yes N/[name] Periwound Skin Moisture: Maceration: No Dry/Scaly: No Atrophie Blanche: No Atrophie Blanche: No N/[name] Periwound Skin Color: Cyanosis: No Cyanosis: No Ecchymosis: No Ecchymosis: No Erythema: No Erythema: No Hemosiderin Staining: No Hemosiderin Staining: No Mottled: No Mottled: No Pallor: No Pallor: No Rubor: No Rubor: No No Abnormality No Abnormality N/[name] Temperature: Debridement Debridement N/[name] Procedures Performed: Treatment Notes Wound #1 (Achilles) Wound Laterality: Right Cleanser Dakins Solution Discharge Instruction: Clean with Dakins Peri-Wound Care Sween Lotion (Moisturizing lotion) Discharge Instruction: Apply moisturizing lotion as directed Topical RENDELL, RABB [name] [id number] Page 4 of 10 Primary Dressing Hydrofera Blue Classic Foam,  4x4 in Discharge Instruction: Moisten with saline prior to applying to wound bed Santyl Ointment Discharge Instruction: Apply nickel thick amount to wound bed as instructed Secondary Dressing ABD Pad, 5x9 Discharge Instruction: Apply over primary dressing as directed. Woven Gauze Sponge, Non-Sterile 4x4 in Discharge Instruction: Apply over primary dressing as directed. Secured With Compression Wrap Kerlix Roll 4.5x3.1 (in/yd) Discharge Instruction: Apply Kerlix and Coban compression as directed. Coban Self-Adherent Wrap 4x5 (in/yd) Discharge Instruction: Apply over Kerlix as directed. Compression Stockings Add-Ons Wound #2 (Foot) Wound Laterality: Plantar, Left Cleanser Dakins Solution Peri-Wound Care Skin Prep Discharge Instruction: Use skin prep as directed Topical Primary Dressing KerraCel Ag Gelling Fiber Dressing, 4x5 in (silver alginate) Discharge Instruction: Apply silver alginate to wound bed as instructed Secondary Dressing Felt 2.5 yds x 5.5 in Discharge Instruction: Apply as donut in shoe Woven Gauze Sponges 2x2 in Discharge Instruction: Apply over primary dressing as directed. Secured With [employer], 4.5x3.1 (in/yd) Discharge Instruction: Secure with [employer]  as directed. 38M Medipore H Soft Cloth Surgical T ape, 4 x 10 (in/yd) Discharge Instruction: Secure with tape as directed. Compression Wrap Compression Stockings Add-Ons Electronic Signature(s) Signed: 08/20/2022 4:46:31 PM By: Darren Shan DO Signed: 08/20/2022 4:52:38 PM By: Darren Pilling RN, BSN Entered By: Darren Shan on 08/20/2022 16:28:23 -------------------------------------------------------------------------------- [place] Details Patient Name: Date of Service: Darren Downer DRO [name] [name] 08/20/2022 3:00 PM [name] ([id number]) [id number] Page 5 of 10 Medical Record Number: [id number] Patient Account Number: [id number] Date  of Birth/Sex: Treating RN: Mar 30, 1951 (87 y.Darren. [name] Primary Care Cecelia Graciano: [name] Other Clinician: Referring Robby Bulkley: Treating Vikki Gains/Extender: [name], [name] in Treatment: 5 Active Inactive Nutrition Nursing Diagnoses: Potential for alteratiion in Nutrition/Potential for imbalanced nutrition Goals: Patient/caregiver agrees to and verbalizes understanding of need to use nutritional supplements and/or vitamins as prescribed Date Initiated: 07/16/2022 Target Resolution Date: 09/17/2022 Goal Status: Active Patient/caregiver will maintain therapeutic glucose control Date Initiated: 07/16/2022 Target Resolution Date: 09/17/2022 Goal Status: Active Interventions: Assess HgA1c results as ordered upon admission and as needed Provide education on nutrition Treatment Activities: Education provided on Nutrition : 08/06/2022 Obtain HgA1c : 07/16/2022 Patient referred to Primary Care Physician for further nutritional evaluation : 07/16/2022 Notes: Pain, Acute or Chronic Nursing Diagnoses: Pain, acute or chronic: actual or potential Potential alteration in comfort, pain Goals: Patient will verbalize adequate pain control and receive pain control interventions during procedures as needed Date Initiated: 07/16/2022 Target Resolution Date: 09/17/2022 Goal Status: Active Interventions: Encourage patient to take pain medications as prescribed Provide education on pain management Reposition patient for comfort Treatment Activities: Administer pain control measures as ordered : 07/16/2022 Notes: Electronic Signature(s) Signed: 08/20/2022 4:52:38 PM By: Darren Pilling RN, BSN Entered By: [name] on 08/20/2022 15:37:52 -------------------------------------------------------------------------------- Pain Assessment Details Patient Name: Date of Service: [name]. 08/20/2022 3:00 PM Medical Record Number: [id number] Patient Account Number:  [id number] Date of Birth/Sex: Treating RN: January 07, 1951 (15 y.Darren. [name] Primary Care Baine Decesare: [name] Other Clinician: Referring Ramesses Crampton: Treating Mattis Featherly/Extender: [name], [name] in Treatment: 5 [name], [name] ([id number]) [id number] Page 6 of 10 Active Problems Location of Pain Severity and Description of Pain Patient Has Paino No Site Locations Rate the pain. Current Pain Level: 0 Pain Management and Medication Current Pain Management: Medication: No Cold Application: No Rest: No Massage: No Activity: No T.E.N.S.: No Heat Application: No Leg drop or elevation: No Is the Current Pain Management Adequate: Adequate How does your wound impact your activities of daily livingo Sleep: No Bathing: No Appetite: No Relationship With Others: No Bladder Continence: No Emotions: No Bowel Continence: No Work: No Toileting: No Drive: No Dressing: No Hobbies: No Engineer, maintenance) Signed: 08/20/2022 4:52:38 PM By: Darren Pilling RN, BSN Entered By: [name] on 08/20/2022 15:28:12 -------------------------------------------------------------------------------- Patient/Caregiver Education Details Patient Name: Date of Service: [name]. 10/12/2023andnbsp3:00 PM Medical Record Number: [id number] Patient Account Number: [id number] Date of Birth/Gender: Treating RN: 08/06/1951 (8 y.Darren. [name] Primary Care Physician: [name] Other Clinician: Referring Physician: Treating Physician/Extender: [name] in Treatment: 5 Education Assessment Education Provided To: Patient Education Topics Provided Nutrition: Handouts: Nutrition Methods: Explain/Verbal Responses: Reinforcements needed [name], FEIGHTNER [name] ([id number]) [id number] Page 7 of 10 Electronic Signature(s) Signed: 08/20/2022 4:52:38 PM By: Darren Pilling RN,  BSN Entered By: [name] on 08/20/2022 15:38:03 -------------------------------------------------------------------------------- Wound Assessment Details Patient Name: Date of Service: Darren Downer DRO [name] [facility]  [name]. 08/20/2022 3:00 PM Medical Record Number: [id number] Patient Account Number: [id number] Date of Birth/Sex: Treating RN: 28-Feb-1951 (8 y.Darren. [name] Primary Care Sayana Salley: [name] Other Clinician: Referring Evanie Buckle: Treating Anushri Casalino/Extender: [name], [name] in Treatment: 5 Wound Status Wound Number: 1 Primary Diabetic Wound/Ulcer of the Lower Extremity Etiology: Wound Location: Right Achilles Wound Open Wounding Event: Surgical Injury Status: Date Acquired: 07/16/2022 Comorbid Sleep Apnea, Arrhythmia, Congestive Heart Failure, Hypertension, [name] Of Treatment: 5 History: Peripheral Arterial Disease, Type II Diabetes Clustered Wound: No Photos Wound Measurements Length: (cm) 6.2 Width: (cm) 2.7 Depth: (cm) 0.2 Area: (cm) 13.148 Volume: (cm) 2.63 % Reduction in Area: 35.6% % Reduction in Volume: 74.2% Epithelialization: Medium (34-66%) Tunneling: No Undermining: No Wound Description Classification: Grade 2 Wound Margin: Distinct, outline attached Exudate Amount: Medium Exudate Type: Serosanguineous Exudate Color: red, brown Foul Odor After Cleansing: No Slough/Fibrino Yes Wound Bed Granulation Amount: Large (67-100%) Exposed Structure Granulation Quality: Red, Pink Fascia Exposed: No Necrotic Amount: Small (1-33%) Fat Layer (Subcutaneous Tissue) Exposed: Yes Necrotic Quality: Adherent Slough Tendon Exposed: Yes Muscle Exposed: No Joint Exposed: No Bone Exposed: No Periwound Skin Texture Texture Color No Abnormalities Noted: Yes No Abnormalities Noted: No Atrophie Blanche: No Moisture Cyanosis: No No Abnormalities Noted: No Ecchymosis: No Dry / Scaly: Yes Erythema: No Maceration: No Hemosiderin Staining:  No KISHORE, WERTHMAN [name] ([id number]) [id number] Page 8 of 10 Mottled: No Pallor: No Rubor: No Temperature / Pain Temperature: No Abnormality Treatment Notes Wound #1 (Achilles) Wound Laterality: Right Cleanser Dakins Solution Discharge Instruction: Clean with Dakins Peri-Wound Care Sween Lotion (Moisturizing lotion) Discharge Instruction: Apply moisturizing lotion as directed Topical Primary Dressing Hydrofera Blue Classic Foam, 4x4 in Discharge Instruction: Moisten with saline prior to applying to wound bed Santyl Ointment Discharge Instruction: Apply nickel thick amount to wound bed as instructed Secondary Dressing ABD Pad, 5x9 Discharge Instruction: Apply over primary dressing as directed. Woven Gauze Sponge, Non-Sterile 4x4 in Discharge Instruction: Apply over primary dressing as directed. Secured With Compression Wrap Kerlix Roll 4.5x3.1 (in/yd) Discharge Instruction: Apply Kerlix and Coban compression as directed. Coban Self-Adherent Wrap 4x5 (in/yd) Discharge Instruction: Apply over Kerlix as directed. Compression Stockings Add-Ons Electronic Signature(s) Signed: 08/20/2022 4:52:38 PM By: Darren Pilling RN, BSN Entered By: [name] on 08/20/2022 15:41:08 -------------------------------------------------------------------------------- Wound Assessment Details Patient Name: Date of Service: Darren Fantasia [name] [facility] [name]. 08/20/2022 3:00 PM Medical Record Number: [id number] Patient Account Number: [id number] Date of Birth/Sex: Treating RN: [birth year] (46 y.Darren. [name] Primary Care Weronika Birch: [name] Other Clinician: Referring Harshika Mago: Treating Armany Mano/Extender: [name], [name] in Treatment: 5 Wound Status Wound Number: 2 Primary Diabetic Wound/Ulcer of the Lower Extremity Etiology: Wound Location: Left, Plantar Foot Wound Open Wounding Event: Gradually Appeared Status: Date Acquired:  03/09/2022 Comorbid Sleep Apnea, Arrhythmia, Congestive Heart Failure, Hypertension, [name] Of Treatment: 5 History: Peripheral Arterial Disease, Type II Diabetes Clustered Wound: No Photos KEYVIN, EAVEY [name] ([id number]) [id number] Page 9 of 10 Wound Measurements Length: (cm) 0.9 Width: (cm) 0.4 Depth: (cm) 0.4 Area: (cm) 0.283 Volume: (cm) 0.113 % Reduction in Area: -28.6% % Reduction in Volume: -71.2% Epithelialization: None Tunneling: No Undermining: Yes Starting Position (Darren'clock): 8 Ending Position (Darren'clock): 11 Maximum Distance: (cm) 0.8 Wound Description Classification: Grade 2 Wound Margin: Thickened Exudate Amount: Medium Exudate Type: Serosanguineous Exudate Color: red, brown Foul Odor After Cleansing: No Slough/Fibrino No Wound Bed Granulation Amount: Large (67-100%) Exposed Structure Granulation Quality: Pink, Pale Fascia Exposed: No Necrotic Amount: None Present (0%) Fat Layer (  Subcutaneous Tissue) Exposed: Yes Tendon Exposed: No Muscle Exposed: No Joint Exposed: No Bone Exposed: No Periwound Skin Texture Texture Color No Abnormalities Noted: No No Abnormalities Noted: No Callus: Yes Atrophie Blanche: No Crepitus: No Cyanosis: No Excoriation: No Ecchymosis: No Induration: No Erythema: No Rash: No Hemosiderin Staining: No Scarring: No Mottled: No Pallor: No Moisture Rubor: No No Abnormalities Noted: No Dry / Scaly: No Temperature / Pain Maceration: Yes Temperature: No Abnormality Treatment Notes Wound #2 (Foot) Wound Laterality: Plantar, Left Cleanser Dakins Solution Peri-Wound Care Skin Prep Discharge Instruction: Use skin prep as directed Topical Primary Dressing KerraCel Ag Gelling Fiber Dressing, 4x5 in (silver alginate) Discharge Instruction: Apply silver alginate to wound bed as instructed Secondary Dressing Felt 2.5 yds x 5.5 in Discharge Instruction: Apply as donut in shoe DERICO, MITTON [name]  ([id number]) [id number] Page 10 of 10 Woven Gauze Sponges 2x2 in Discharge Instruction: Apply over primary dressing as directed. Secured With [employer], 4.5x3.1 (in/yd) Discharge Instruction: Secure with Kerlix as directed. 34M Medipore H Soft Cloth Surgical T ape, 4 x 10 (in/yd) Discharge Instruction: Secure with tape as directed. Compression Wrap Compression Stockings Add-Ons Electronic Signature(s) Signed: 08/20/2022 4:52:38 PM By: Darren Pilling RN, BSN Entered By: [name] on 08/20/2022 15:41:24 -------------------------------------------------------------------------------- Vitals Details Patient Name: Date of Service: [name]. 08/20/2022 3:00 PM Medical Record Number: [id number] Patient Account Number: [id number] Date of Birth/Sex: Treating RN: 19-Nov-1950 (74 y.Darren. [name] Primary Care [place] Early: [name] Other Clinician: Referring [place] Freshour: Treating Sharlette Jansma/Extender: [name], [name] in Treatment: 5 Vital Signs Time Taken: 15:27 Temperature (F): 98.5 Height (in): 68 Pulse (bpm): 76 Weight (lbs): 185 Respiratory Rate (breaths/min): 20 Body Mass Index (BMI): 28.1 Blood Pressure (mmHg): 161/92 Reference Range: 80 - 120 mg / dl Electronic Signature(s) Signed: 08/20/2022 4:52:38 PM By: Darren Pilling RN, BSN Entered By: [name] on 08/20/2022 15:28:05

## 2022-08-20 NOTE — Progress Notes (Signed)
[name], GOUDEAU A ([id number]) [id number] Page 1 of 10 Visit Report for 08/20/2022 Chief Complaint Document Details Patient Name: Date of Service: [name] [facility] A. 08/20/2022 3:00 PM Medical Record Number: [id number] Patient Account Number: [id number] Date of Birth/Sex: Treating RN: [birth year] (71 y.o. [name] Primary Care Provider: [name] Other Clinician: Referring Provider: Treating Provider/Extender: [name], [name] in Treatment: 5 Information Obtained from: Patient Chief Complaint 07/16/2022; bilateral lower extremity wounds Electronic Signature(s) Signed: 08/20/2022 4:46:31 PM By: [name] DO Entered By: [name] on 08/20/2022 16:28:36 -------------------------------------------------------------------------------- Debridement Details Patient Name: Date of Service: [name] [name] 08/20/2022 3:00 PM Medical Record Number: [id number] Patient Account Number: [id number] Date of Birth/Sex: Treating RN: [birth year] (71 y.o. [name] Primary Care Provider: [name] Other Clinician: Referring Provider: Treating Provider/Extender: [name] Ngetich, [name] in Treatment: 5 Debridement Performed for Assessment: Wound #2 Left,Plantar Foot Performed By: Physician [name], DO Debridement Type: Debridement Severity of Tissue Pre Debridement: Fat layer exposed Level of Consciousness (Pre-procedure): Awake and Alert Pre-procedure Verification/Time Out Yes - 16:00 Taken: Start Time: 16:01 Pain Control: Lidocaine 5% topical ointment T Area Debrided (L x W): otal 1 (cm) x 2 (cm) = 2 (cm) Tissue and other material debrided: Viable, Non-Viable, Callus, Slough, Subcutaneous, Skin: Dermis , Skin: Epidermis, Slough Level: Skin/Subcutaneous Tissue Debridement Description: Excisional Instrument: Curette Bleeding: Minimum Hemostasis Achieved: Pressure End Time:  16:09 Procedural Pain: 0 Post Procedural Pain: 0 Response to Treatment: Procedure was tolerated well Level of Consciousness (Post- Awake and Alert procedure): Post Debridement Measurements of Total Wound Length: (cm) 0.9 Width: (cm) 0.4 Depth: (cm) 0.4 Volume: (cm) 0.113 Character of Wound/Ulcer Post Debridement: Improved Severity of Tissue Post Debridement: Fat layer exposed MITCHELLE, GOERNER A ([id number]) [id number] Page 2 of 10 Post Procedure Diagnosis Same as Pre-procedure Electronic Signature(s) Signed: 08/20/2022 4:46:31 PM By: [name] DO Signed: 08/20/2022 4:52:38 PM By: Deon Pilling RN, BSN Entered By: Deon Pilling on 08/20/2022 16:09:57 -------------------------------------------------------------------------------- Debridement Details Patient Name: Date of Service: [name] [name] 08/20/2022 3:00 PM Medical Record Number: [id number] Patient Account Number: [id number] Date of Birth/Sex: Treating RN: 08/02/1951 (71 y.o. [name] Primary Care Provider: [name] Other Clinician: Referring Provider: Treating Provider/Extender: [name] Ngetich, [name] in Treatment: 5 Debridement Performed for Assessment: Wound #1 Right Achilles Performed By: Physician [name], DO Debridement Type: Debridement Severity of Tissue Pre Debridement: Fat layer exposed Level of Consciousness (Pre-procedure): Awake and Alert Pre-procedure Verification/Time Out Yes - 16:00 Taken: Start Time: 16:01 Pain Control: Lidocaine 5% topical ointment T Area Debrided (L x W): otal 6.2 (cm) x 2.7 (cm) = 16.74 (cm) Tissue and other material debrided: Viable, Non-Viable, Callus, Slough, Subcutaneous, Skin: Dermis , Skin: Epidermis, Slough Level: Skin/Subcutaneous Tissue Debridement Description: Excisional Instrument: Curette Bleeding: Minimum Hemostasis Achieved: Pressure End Time: 16:09 Procedural Pain: 0 Post  Procedural Pain: 0 Response to Treatment: Procedure was tolerated well Level of Consciousness (Post- Awake and Alert procedure): Post Debridement Measurements of Total Wound Length: (cm) 6.2 Width: (cm) 2.7 Depth: (cm) 0.2 Volume: (cm) 2.63 Character of Wound/Ulcer Post Debridement: Improved Severity of Tissue Post Debridement: Fat layer exposed Post Procedure Diagnosis Same as Pre-procedure Notes procedure documented by Deon Pilling, RN. Electronic Signature(s) Signed: 08/20/2022 4:46:31 PM By: [name] DO Signed: 08/20/2022 4:52:38 PM By: Deon Pilling RN, BSN Entered By: Deon Pilling on 08/20/2022 16:17:31 [name] ([id number]) [id number] Page 3 of 10 -------------------------------------------------------------------------------- HPI Details Patient  Name: Date of Service: [name] [facility] A. 08/20/2022 3:00 PM Medical Record Number: [id number] Patient Account Number: [id number] Date of Birth/Sex: Treating RN: June 28, 1951 (71 y.o. 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 He is not sure how the wounds started. He has been following with podiatry for this issue. He had removal of the tendon to the right lower extremity by Dr. SBlenda Mounts He has been using Betadine to the wound beds. On 06/11/2022 he had a right posterior tibial  artery angioplasty by Dr. CCarlis Abbott It was reported the patient is optimized after revascularization of the right lower extremity. His ABIs on the left were 0.91. He currently denies systemic signs of infection. He also does not wear shoes. He came in with Kerlix wrap to his feet bilaterally. This did not completely cover his feet. 07/23/2022: This is a patient of Dr. HJodene Namthat she asked me to take a look at last week due to the significant involvement of the muscle and tendon on his right posterior leg wound. He needed an aggressive debridement and asked me if I would be able to perform this on her behalf. The patient is here today for that procedure. When his dressing was removed in clinic today, the wound was teeming with maggots. There is necrotic muscle, tendon, and fat, with a thick layer of slough on the wound. 9/21; patient presents for follow-up. He was debrided by Dr. CCeline Ahrat last clinic visit without any issues. He has been placing Dakin's wet-to-dry dressings to the right posterior wound. He has been using Medihoney to the left foot wound. He reports improvement in wound healing. He has no issues or complaints today. 9/28; patient with type 2 diabetes PAD status post revascularization. He underwent retrograde right PTA angioplasty. Last saw Dr. CCarlis Abbotton 9/12 and was felt to have a brisk posterior tibial signal at the right ankle. He had a pulsatile toe tracing that appeared adequate for healing. He has been using Santyl and Hydrofera Blue on the right Achilles area. He has a smaller area on the left fifth plantar metatarsal head They were apparently in the ER on 9/23 with swelling and discoloration above the wrap. They have a picture of the leg after the wrap was taken off which looks like that it was excessively tight superiorly 10/6; patient presents for follow-up. We have been using Santyl and Hydrofera Blue to the right lower extremity wound under Kerlix/Coban. T the left foot  we o have been using silver alginate with Medihoney. He again comes in with no shoes. He has no issues or complaints today. 10/12; patient presents for follow-up. We have been using Santyl and Hydrofera Blue to the right lower extremity under Kerlix/Coban And silver alginate to the left foot wound. He has no issues today. He denies signs of infection. Electronic Signature(s) Signed: 08/20/2022 4:46:31 PM By: HKalman ShanDO Entered By: HKalman Shanon 08/20/2022 16:30:05 -------------------------------------------------------------------------------- Physical Exam Details Patient Name: Date of Service: OJenetta DownerDRO NRolley [name]  [name] 08/20/2022 3:00 PM Medical Record Number: [id number] Patient Account Number: [id number] Date of Birth/Sex: Treating RN: [birth year] (71 y.o. [name] Primary Care Provider: [name] Other Clinician: Referring Provider: Treating Provider/Extender: [name] Ngetich, [name] in Treatment: 5 Constitutional respirations regular, non-labored and within target range for patient.. Cardiovascular 2+ dorsalis pedis/posterior tibialis pulses. Psychiatric pleasant and cooperative. Notes Right lower extremity: T the posterior aspect there is An open wound with granulation tissue and slough. Left lower extremity: T the plantar aspect of the fifth o o met head there is an open wound with granulation tissue Devitalized tissue and Callus. No signs of surrounding infection T any of the wound beds. Venous o stasis dermatitis and lymphedema skin changes bilaterally RUTHERFORD, ALARIE A ([id number]) [id number] Page 4 of 10 Electronic Signature(s) Signed: 08/20/2022 4:46:31 PM By: [name] DO Entered By: [name] on 08/20/2022 16:31:05 -------------------------------------------------------------------------------- Physician Orders Details Patient Name: Date of Service: [name] [name] 08/20/2022  3:00 PM Medical Record Number: [id number] Patient Account Number: [id number] Date of Birth/Sex: Treating RN: 1950-11-29 (71 y.o. 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 - Dr. Heber  Room 8 Return A Other: - [employer]- will ship wound care supplies. Anesthetic (In clinic) Topical Lidocaine 5% applied to wound bed Cellular or Tissue Based Products Cellular or Tissue Based Product Type: - run insurance for [employer] and [employer]askin to see if approved. Bathing/ Shower/ Hygiene May shower with protection but do not get wound dressing(s) wet. Edema Control - Lymphedema / SCD / Other Elevate legs to the level of the heart or above for 30 minutes daily and/or when sitting, a frequency of: - 3-4 times a day throughout the day. Avoid standing for long periods of time. Off-Loading Open toe surgical shoe to: - to both feet Other: - minimize walking and standing to aid in offloading pressure to wound. Wound Treatment Wound #1 - Achilles Wound Laterality: Right Cleanser: Dakins Solution 1 x Per Week/30 Days Discharge Instructions: Clean with Dakins Peri-Wound Care: Sween Lotion (Moisturizing lotion) 1 x Per Week/30 Days Discharge Instructions: Apply moisturizing lotion as directed Prim Dressing: Hydrofera Blue Classic Foam, 4x4 in 1 x Per Week/30 Days ary Discharge Instructions: Moisten  with saline prior to applying to wound bed Prim Dressing: Santyl Ointment 1 x Per Week/30 Days ary Discharge Instructions: Apply nickel thick amount to wound bed as instructed Secondary Dressing: ABD Pad, 5x9 1 x Per Week/30 Days Discharge Instructions: Apply over primary dressing as directed. Secondary Dressing: Woven Gauze Sponge, Non-Sterile 4x4 in 1 x Per Week/30 Days Discharge Instructions: Apply over primary dressing as directed. Compression Wrap: Kerlix Roll 4.5x3.1 (in/yd) 1 x Per Week/30 Days Discharge Instructions: Apply Kerlix and Coban compression as directed. DAGAN, HEINZ A ([id number]) [id number] Page 5 of 10 Compression Wrap: Coban Self-Adherent Wrap 4x5 (in/yd) 1 x Per Week/30 Days Discharge Instructions: Apply over Kerlix as directed. Wound #2 - Foot Wound Laterality: Plantar, Left Cleanser: Dakins Solution 1 x Per Day/30 Days  Peri-Wound Care: Skin Prep 1 x Per Day/30 Days Discharge Instructions: Use skin prep as directed Prim Dressing: KerraCel Ag Gelling Fiber Dressing, 4x5 in (silver alginate) 1 x Per Day/30 Days ary Discharge Instructions: Apply silver alginate to wound bed as instructed Secondary Dressing: Felt 2.5 yds x 5.5 in 1 x Per Day/30 Days Discharge Instructions: Apply as donut in shoe Secondary Dressing: Woven Gauze Sponges 2x2 in 1 x Per Day/30 Days Discharge Instructions: Apply over primary dressing as directed. Secured With: [employer], 4.5x3.1 (in/yd) 1 x Per Day/30 Days Discharge Instructions: Secure with Kerlix as directed. Secured With: 85M Medipore H Soft Cloth Surgical T ape, 4 x 10 (in/yd) 1 x Per Day/30 Days Discharge Instructions: Secure with tape as directed. Electronic Signature(s) Signed: 08/20/2022 4:46:31 PM By: [name] DO Entered By: [name] on 08/20/2022 16:31:14 -------------------------------------------------------------------------------- Problem List Details Patient Name:  Date of Service: [name] [facility] A. 08/20/2022 3:00 PM Medical Record Number: [id number] Patient Account Number: [id number] Date of Birth/Sex: Treating RN: 1950-12-28 (71 y.o. [name] Primary Care Provider: [name] Other Clinician: Referring Provider: Treating Provider/Extender: [name], [name] in Treatment: 5 Active Problems ICD-10 Encounter Code Description Active Date MDM Diagnosis L97.518 Non-pressure chronic ulcer of other part of right foot with other specified 07/16/2022 No Yes severity L97.522 Non-pressure chronic ulcer of other part of left foot with fat layer exposed 07/16/2022 No Yes E11.621 Type 2 diabetes mellitus with foot ulcer 07/16/2022 No Yes I73.9 Peripheral vascular disease, unspecified 07/16/2022 No Yes 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 07/23/2022 No Yes muscle JAVEON, MACMURRAY A ([id number]) [id number] Page 6 of 10 Inactive Problems Resolved Problems Electronic Signature(s) Signed: 08/20/2022 4:46:31 PM By: [name] DO Entered By: [name] on 08/20/2022 16:28:17 -------------------------------------------------------------------------------- Progress Note Details Patient Name: Date of Service: [name] [name] 08/20/2022 3:00 PM Medical Record Number: [id number] Patient Account Number: [id number] Date of Birth/Sex: Treating RN: 1951-03-11 (71 y.o. [name] Primary Care Provider: [name] Other Clinician: Referring Provider: Treating Provider/Extender: [name], [name] in Treatment: 5 Subjective Chief Complaint Information obtained from Patient 07/16/2022; bilateral lower extremity wounds History of Present Illness (HPI) Admission 07/16/2022 Mr. Shey Bartmess is a 71 year old male with a past medical history of insulin-dependent currently controlled type 2 diabetes complicated by  peripheral neuropathy, chronic systolic heart failure, obstructive sleep apnea, and peripheral vascular disease that presents to the clinic for a several month history of nonhealing ulcer to the back of the right leg and a 35-monthhistory of nonhealing wounds to the left foot. He is not sure how the wounds started. He has been following with podiatry for this issue. He had removal of the tendon to the right lower extremity by Dr. SBlenda Mounts He has been using Betadine to the wound beds. On 06/11/2022 he had a right posterior tibial artery angioplasty by Dr. CCarlis Abbott It was reported the patient is optimized after revascularization of the right lower extremity. His ABIs on the left were 0.91. He currently denies systemic signs of infection. He also does not wear shoes. He came in with Kerlix wrap to his feet bilaterally. This did not completely cover his feet. 07/23/2022: This is a patient of Dr. HJodene Namthat she asked me to take a look at last week due to the significant involvement of the muscle and tendon on his right posterior leg wound. He needed an aggressive debridement and asked  me if I would be able to perform this on her behalf. The patient is here today for that procedure. When his dressing was removed in clinic today, the wound was teeming with maggots. There is necrotic muscle, tendon, and fat, with a thick layer of slough on the wound. 9/21; patient presents for follow-up. He was debrided by Dr. Celine Ahr at last clinic visit without any issues. He has been placing Dakin's wet-to-dry dressings to the right posterior wound. He has been using Medihoney to the left foot wound. He reports improvement in wound healing. He has no issues or complaints today. 9/28; patient with type 2 diabetes PAD status post revascularization. He underwent retrograde right PTA angioplasty. Last saw Dr. Carlis Abbott on 9/12 and was felt to have a brisk posterior tibial signal at the right ankle. He had a pulsatile toe tracing that  appeared adequate for healing. He has been using Santyl and Hydrofera Blue on the right Achilles area. oo He has a smaller area on the left fifth plantar metatarsal head They were apparently in the ER on 9/23 with swelling and discoloration above the wrap. They have a picture of the leg after the wrap was taken off which looks like that it was excessively tight superiorly 10/6; patient presents for follow-up. We have been using Santyl and Hydrofera Blue to the right lower extremity wound under Kerlix/Coban. T the left foot we o have been using silver alginate with Medihoney. He again comes in with no shoes. He has no issues or complaints today. 10/12; patient presents for follow-up. We have been using Santyl and Hydrofera Blue to the right lower extremity under Kerlix/Coban And silver alginate to the left foot wound. He has no issues today. He denies signs of infection. Patient History Information obtained from Chart. Family History Diabetes - Mother, Heart Disease - Mother,Siblings. Social History Current every day smoker, Alcohol Use - Never, Drug Use - No History, Caffeine Use - Never. Medical History Respiratory Patient has history of Sleep Apnea - does not wear Cardiovascular Patient has history of Arrhythmia - A. Fib, Congestive Heart Failure - EF 25%, Hypertension, Peripheral Arterial Disease Denies history of Peripheral Venous Disease Endocrine Patient has history of Type II Diabetes CYRIL, WOODMANSEE A ([id number]) [id number] Page 7 of 10 Hospitalization/Surgery History - Angiogram 06/11/2022 Dr. Carlis Abbott [facility]. - cardioversion 11/06/2021, 2015, 2017. 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 Medical A Surgical History Notes nd Gastrointestinal Ascites Genitourinary stage III Kidney disease Objective Constitutional respirations regular, non-labored and within target range for patient.. Vitals Time Taken: 3:27 PM, Height: 68 in, Weight: 185 lbs, BMI: 28.1,  Temperature: 98.5 F, Pulse: 76 bpm, Respiratory Rate: 20 breaths/min, Blood Pressure: 161/92 mmHg. Cardiovascular 2+ dorsalis pedis/posterior tibialis pulses. Psychiatric pleasant and cooperative. General Notes: Right lower extremity: T the posterior aspect there is An open wound with granulation tissue and slough. Left lower extremity: T the plantar o o aspect of the fifth met head there is an open wound with granulation tissue Devitalized tissue and Callus. No signs of surrounding infection T any of the o wound beds. Venous stasis dermatitis and lymphedema skin changes bilaterally Integumentary (Hair, Skin) Wound #1 status is Open. Original cause of wound was Surgical Injury. The date acquired was: 07/16/2022. The wound has been in treatment 5 weeks. The wound is located on the Right Achilles. The wound measures 6.2cm length x 2.7cm width x 0.2cm depth; 13.148cm^2 area and 2.63cm^3 volume. There is tendon and Fat Layer (Subcutaneous Tissue) exposed. There is no tunneling  or undermining noted. There is a medium amount of serosanguineous drainage noted. The wound margin is distinct with the outline attached to the wound base. There is large (67-100%) red, pink granulation within the wound bed. There is a small (1- 33%) amount of necrotic tissue within the wound bed including Adherent Slough. The periwound skin appearance had no abnormalities noted for texture. The periwound skin appearance exhibited: Dry/Scaly. The periwound skin appearance did not exhibit: Maceration, Atrophie Blanche, Cyanosis, Ecchymosis, Hemosiderin Staining, Mottled, Pallor, Rubor, Erythema. Periwound temperature was noted as No Abnormality. Wound #2 status is Open. Original cause of wound was Gradually Appeared. The date acquired was: 03/09/2022. The wound has been in treatment 5 weeks. The wound is located on the [place]. The wound measures 0.9cm length x 0.4cm width x 0.4cm depth; 0.283cm^2 area and 0.113cm^3  volume. There is Fat Layer (Subcutaneous Tissue) exposed. There is no tunneling noted, however, there is undermining starting at 8:00 and ending at 11:00 with a maximum distance of 0.8cm. There is a medium amount of serosanguineous drainage noted. The wound margin is thickened. There is large (67-100%) pink, pale granulation within the wound bed. There is no necrotic tissue within the wound bed. The periwound skin appearance exhibited: Callus, Maceration. The periwound skin appearance did not exhibit: Crepitus, Excoriation, Induration, Rash, Scarring, Dry/Scaly, Atrophie Blanche, Cyanosis, Ecchymosis, Hemosiderin Staining, Mottled, Pallor, Rubor, Erythema. Periwound temperature was noted as No Abnormality. 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 I debrided nonviable tissue. I recommended continuing the course with Santyl and Hydrofera Blue under Kerlix/Coban to the right lower extremity. Continue silver alginate to the left foot wound. He would benefit greatly from a skin substitute. We discussed this today and patient would like to to move forward with insurance verification for a skin sub. Procedures Wound #1 Pre-procedure diagnosis of Wound #1 is a Diabetic Wound/Ulcer of the Lower Extremity located on the Right Achilles .Severity of Tissue Pre Debridement is: Fat layer exposed. There was a Excisional Skin/Subcutaneous Tissue Debridement with a total area of 16.74 sq cm performed by [name], DO. With the following instrument(s): Curette to remove Viable and Non-Viable tissue/material.  Material removed includes Callus, Subcutaneous Tissue, Slough, Skin: Dermis, and Skin: Epidermis after achieving pain control using Lidocaine 5% topical ointment. A time out was conducted at 16:00, prior to the start of the procedure. A Minimum amount of bleeding was controlled with Pressure. The procedure was tolerated well with a pain level of 0 throughout and a pain level of 0 following the procedure. Post Debridement Measurements: 6.2cm length x 2.7cm width x 0.2cm depth; 2.63cm^3 volume. SMARAN, GAUS A ([id number]) [id number] Page 8 of 10 Character of Wound/Ulcer Post Debridement is improved. Severity of Tissue Post Debridement is: Fat layer exposed. Post procedure Diagnosis Wound #1: Same as Pre-Procedure General Notes: procedure documented by Deon Pilling, RN.Marland Kitchen Wound #2 Pre-procedure diagnosis of Wound #2 is a Diabetic Wound/Ulcer of the Lower Extremity located on the [place] .Severity of Tissue Pre Debridement is: Fat layer exposed. There was a Excisional Skin/Subcutaneous Tissue Debridement with a total area of 2 sq cm performed by [name], DO. With the following  instrument(s): Curette to remove Viable and Non-Viable tissue/material. Material removed includes Callus, Subcutaneous Tissue, Slough, Skin: Dermis, and Skin: Epidermis after achieving pain control using Lidocaine 5% topical ointment. A time out was conducted at 16:00, prior to the start of the procedure. A Minimum amount of bleeding was controlled with Pressure. The procedure was tolerated well with a pain level of 0 throughout and a pain level of 0 following the procedure. Post Debridement Measurements: 0.9cm length x 0.4cm width x 0.4cm depth; 0.113cm^3 volume. Character of Wound/Ulcer Post Debridement is improved. Severity of Tissue Post Debridement is: Fat layer exposed. Post procedure Diagnosis Wound #2: Same as Pre-Procedure Plan Follow-up Appointments: Return Appointment  in 1 week. - Dr. Heber [place] Room 8 Other: - [facility]- will ship wound care supplies. Anesthetic: (In clinic) Topical Lidocaine 5% applied to wound bed Cellular or Tissue Based Products: Cellular or Tissue Based Product Type: - run insurance for Grafix and theraskin to see if approved. Bathing/ Shower/ Hygiene: May shower with protection but do not get wound dressing(s) wet. Edema Control - Lymphedema / SCD / Other: Elevate legs to the level of the heart or above for 30 minutes daily and/or when sitting, a frequency of: - 3-4 times a day throughout the day. Avoid standing for long periods of time. Off-Loading: Open toe surgical shoe to: - to both feet Other: - minimize walking and standing to aid in offloading pressure to wound. WOUND #1: - Achilles Wound Laterality: Right Cleanser: Dakins Solution 1 x Per Week/30 Days Discharge Instructions: Clean with Dakins Peri-Wound Care: Sween Lotion (Moisturizing lotion) 1 x Per Week/30 Days Discharge Instructions: Apply moisturizing lotion as directed Prim Dressing: Hydrofera Blue Classic Foam, 4x4 in 1 x Per Week/30 Days ary Discharge Instructions: Moisten with saline prior to applying to wound bed Prim Dressing: Santyl Ointment 1 x Per Week/30 Days ary Discharge Instructions: Apply nickel thick amount to wound bed as instructed Secondary Dressing: ABD Pad, 5x9 1 x Per Week/30 Days Discharge Instructions: Apply over primary dressing as directed. Secondary Dressing: Woven Gauze Sponge, Non-Sterile 4x4 in 1 x Per Week/30 Days Discharge Instructions: Apply over primary dressing as directed. Com pression Wrap: Kerlix Roll 4.5x3.1 (in/yd) 1 x Per Week/30 Days Discharge Instructions: Apply Kerlix and Coban compression as directed. Com pression Wrap: Coban Self-Adherent Wrap 4x5 (in/yd) 1 x Per Week/30 Days Discharge Instructions: Apply over Kerlix as directed. WOUND #2: - Foot Wound Laterality: Plantar, Left Cleanser: Dakins Solution 1 x Per Day/30  Days Peri-Wound Care: Skin Prep 1 x Per Day/30 Days Discharge Instructions: Use skin prep as directed Prim Dressing: KerraCel Ag Gelling Fiber Dressing, 4x5 in (silver alginate) 1 x Per Day/30 Days ary Discharge Instructions: Apply silver alginate to wound bed as instructed Secondary Dressing: Felt 2.5 yds x 5.5 in 1 x Per Day/30 Days Discharge Instructions: Apply as donut in shoe Secondary Dressing: Woven Gauze Sponges 2x2 in 1 x Per Day/30 Days Discharge Instructions: Apply over primary dressing as directed. Secured With: [employer], 4.5x3.1 (in/yd) 1 x Per Day/30 Days Discharge Instructions: Secure with Kerlix as directed. Secured With: 108M Medipore H Soft Cloth Surgical T ape, 4 x 10 (in/yd) 1 x Per Day/30 Days Discharge Instructions: Secure with tape as directed. 1. In office sharp debridement 2. Run IVR for Grafix and Kerecis 3. Hydrofera Blue and Santyl under Kerlix/Cobanooright lower extremity 4. Silver alginate to the left foot wound 5. Follow-up in 1 week Electronic Signature(s) Signed: 08/20/2022 4:46:31 PM By: [name] DO Entered By: Heber [place],   on 08/20/2022 16:39:00 CHAPMAN, MATTEUCCI A ([id number]) [id number] Page 9 of 10 -------------------------------------------------------------------------------- HxROS Details Patient Name: Date of Service: [name] [facility] A. 08/20/2022 3:00 PM Medical Record Number: [id number] Patient Account Number: [id number] Date of Birth/Sex: Treating RN: 05/26/1951 (71 y.o. [name] Primary Care Provider: [name] Other Clinician: Referring Provider: Treating Provider/Extender: [name] Ngetich, [name] in Treatment: 5 Information Obtained From Chart Respiratory Medical History: Positive for: Sleep Apnea - does not wear Cardiovascular Medical History: Positive for: Arrhythmia - A. Fib; Congestive Heart Failure - EF 25%; Hypertension; Peripheral Arterial  Disease Negative for: Peripheral Venous Disease Gastrointestinal Medical History: Past Medical History Notes: Ascites Endocrine Medical History: Positive for: Type II Diabetes Time with diabetes: 6 years Treated with: Insulin Blood sugar tested every day: No Genitourinary Medical History: Past Medical History Notes: stage III Kidney disease Immunizations Pneumococcal Vaccine: Received Pneumococcal Vaccination: No Implantable Devices No devices added Hospitalization / Surgery History Type of Hospitalization/Surgery Angiogram 06/11/2022 Dr. 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 Family and Social History Diabetes: Yes - Mother; Heart Disease: Yes - Mother,Siblings; Current every day smoker; Alcohol Use: Never; Drug Use: No History; Caffeine Use: Never; Financial Concerns: No; Food, Clothing or Shelter Needs: No; Support System Lacking: No; Transportation Concerns: No Electronic Signature(s) Signed: 08/20/2022 4:46:31 PM By: [name] DO Signed: 08/20/2022 4:52:38 PM By: Deon Pilling RN, BSN Entered By: [name] on 08/20/2022 16:30:10 [name] ([id number]) [id number] Page 10 of 10 -------------------------------------------------------------------------------- SuperBill Details Patient Name: Date of Service: [name] [facility] A. 08/20/2022 Medical Record Number: [id number] Patient Account Number: [id number] Date of Birth/Sex: Treating RN: September 23, 1951 (71 y.o. 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 Facility Procedures : CPT4 Code: [id number] Description: 20802 - DEB SUBQ TISSUE 20 SQ CM/< ICD-10 Diagnosis Description L97.518 Non-pressure chronic ulcer of other part of right foot with other specified sev 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 mu Modifier: erity scle Quantity: 1 Physician Procedures : CPT4 Code Description Modifier [id number] - WC PHYS SUBQ TISS 20 SQ CM ICD-10 Diagnosis Description L97.518 Non-pressure chronic ulcer of other part of right foot with other specified severity L97.813 Non-pressure chronic ulcer of other part of  right lower leg with necrosis of muscle Quantity: 1 Electronic Signature(s) Signed: 08/20/2022 4:46:31 PM By: [name] DO Entered By: [name] on 08/20/2022 16:39:14

## 2022-08-21 ENCOUNTER — Other Ambulatory Visit: Payer: Self-pay

## 2022-08-21 DIAGNOSIS — I739 Peripheral vascular disease, unspecified: Secondary | ICD-10-CM

## 2022-08-21 DIAGNOSIS — I70223 Atherosclerosis of native arteries of extremities with rest pain, bilateral legs: Secondary | ICD-10-CM

## 2022-08-25 ENCOUNTER — Ambulatory Visit: Payer: Medicare HMO | Attending: Cardiovascular Disease

## 2022-08-25 DIAGNOSIS — Z5181 Encounter for therapeutic drug level monitoring: Secondary | ICD-10-CM

## 2022-08-25 DIAGNOSIS — I513 Intracardiac thrombosis, not elsewhere classified: Secondary | ICD-10-CM | POA: Diagnosis not present

## 2022-08-25 LAB — POCT INR: INR: 1.6 — AB (ref 2.0–3.0)

## 2022-08-25 NOTE — Patient Instructions (Signed)
Take an extra tablet today and 1 tablet tomorrow and then continue 1/2 tablet daily except for 1 tablet on Mondays and Fridays. Recheck INR 2 weeks.  [facility] [phone].  Pt stopped taking amio on 3/7

## 2022-08-27 ENCOUNTER — Encounter (HOSPITAL_BASED_OUTPATIENT_CLINIC_OR_DEPARTMENT_OTHER): Payer: Medicare HMO | Admitting: Internal Medicine

## 2022-08-27 DIAGNOSIS — L97518 Non-pressure chronic ulcer of other part of right foot with other specified severity: Secondary | ICD-10-CM

## 2022-08-27 DIAGNOSIS — Z794 Long term (current) use of insulin: Secondary | ICD-10-CM | POA: Diagnosis not present

## 2022-08-27 DIAGNOSIS — E1151 Type 2 diabetes mellitus with diabetic peripheral angiopathy without gangrene: Secondary | ICD-10-CM | POA: Diagnosis not present

## 2022-08-27 DIAGNOSIS — L97522 Non-pressure chronic ulcer of other part of left foot with fat layer exposed: Secondary | ICD-10-CM

## 2022-08-27 DIAGNOSIS — E1140 Type 2 diabetes mellitus with diabetic neuropathy, unspecified: Secondary | ICD-10-CM | POA: Diagnosis not present

## 2022-08-27 DIAGNOSIS — E11621 Type 2 diabetes mellitus with foot ulcer: Secondary | ICD-10-CM | POA: Diagnosis not present

## 2022-08-27 DIAGNOSIS — I110 Hypertensive heart disease with heart failure: Secondary | ICD-10-CM | POA: Diagnosis not present

## 2022-08-27 DIAGNOSIS — I5022 Chronic systolic (congestive) heart failure: Secondary | ICD-10-CM | POA: Diagnosis not present

## 2022-08-27 DIAGNOSIS — L97813 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: Secondary | ICD-10-CM | POA: Diagnosis not present

## 2022-08-28 NOTE — Progress Notes (Signed)
Darren, MELUCCI [name] ([id number]) [id number] Page 1 of 10 Visit Report for 08/27/2022 Chief Complaint Document Details Patient Name: Date of Service: [name] [facility] [name]. 08/27/2022 8:15 [name] Medical Record Number: [id number] Patient Account Number: [id number] Date of Birth/Sex: Treating RN: [birth year] (71 y.Darren. Hessie [name] Primary Care Provider: [name] Other Clinician: Referring Provider: Treating Provider/Extender: [name], [name] in Treatment: 6 Information Obtained from: Patient Chief Complaint 07/16/2022; bilateral lower extremity wounds Electronic Signature(s) Signed: 08/27/2022 10:44:44 AM By: [name] DO Entered By: [name] on 08/27/2022 09:35:20 -------------------------------------------------------------------------------- Debridement Details Patient Name: Date of Service: [name]. 08/27/2022 8:15 [name] Medical Record Number: [id number] Patient Account Number: [id number] Date of Birth/Sex: Treating RN: 1951/06/06 (54 y.Darren. Hessie [name] Primary Care Provider: [name] Other Clinician: Referring Provider: Treating Provider/Extender: [name] Ngetich, [name] in Treatment: 6 Debridement Performed for Assessment: Wound #1 Right Achilles Performed By: Physician [name], DO Debridement Type: Debridement Severity of Tissue Pre Debridement: Fat layer exposed Level of Consciousness (Pre-procedure): Awake and Alert Pre-procedure Verification/Time Out Yes - 08:50 Taken: Start Time: 08:51 Pain Control: Lidocaine 5% topical ointment T Area Debrided (L x W): otal 6 (cm) x 2.5 (cm) = 15 (cm) Tissue and other material debrided: Viable, Non-Viable, Slough, Subcutaneous, Skin: Dermis , Skin: Epidermis, Slough Level: Skin/Subcutaneous Tissue Debridement Description: Excisional Instrument: Curette Bleeding: Minimum Hemostasis Achieved: Pressure End Time:  08:56 Procedural Pain: 0 Post Procedural Pain: 0 Response to Treatment: Procedure was tolerated well Level of Consciousness (Post- Awake and Alert procedure): Post Debridement Measurements of Total Wound Length: (cm) 5.9 Width: (cm) 2.5 Depth: (cm) 0.3 Volume: (cm) 3.475 Character of Wound/Ulcer Post Debridement: Improved Severity of Tissue Post Debridement: Fat layer exposed [name], [name] ([id number]) [id number] Page 2 of 10 Post Procedure Diagnosis Same as Pre-procedure Notes documented by Deon Pilling, RN. Electronic Signature(s) Signed: 08/27/2022 10:44:44 AM By: [name] DO Signed: 08/27/2022 6:08:05 PM By: Deon Pilling RN, BSN Entered By: Deon Pilling on 08/27/2022 08:57:29 -------------------------------------------------------------------------------- Debridement Details Patient Name: Date of Service: [name]. 08/27/2022 8:15 [name] Medical Record Number: [id number] Patient Account Number: [id number] Date of Birth/Sex: Treating RN: 08/24/1951 (15 y.Darren. Hessie [name] Primary Care Provider: [name] Other Clinician: Referring Provider: Treating Provider/Extender: [name] Ngetich, [name] in Treatment: 6 Debridement Performed for Assessment: Wound #2 Left,Plantar Foot Performed By: Physician [name], DO Debridement Type: Debridement Severity of Tissue Pre Debridement: Fat layer exposed Level of Consciousness (Pre-procedure): Awake and Alert Pre-procedure Verification/Time Out Yes - 08:50 Taken: Start Time: 08:51 Pain Control: Lidocaine 5% topical ointment T Area Debrided (L x W): otal 1 (cm) x 1 (cm) = 1 (cm) Tissue and other material debrided: Viable, Non-Viable, Callus, Slough, Subcutaneous, Slough Level: Skin/Subcutaneous Tissue Debridement Description: Excisional Instrument: Curette Bleeding: Minimum Hemostasis Achieved: Pressure End Time: 08:56 Procedural Pain: 0 Post  Procedural Pain: 0 Response to Treatment: Procedure was tolerated well Level of Consciousness (Post- Awake and Alert procedure): Post Debridement Measurements of Total Wound Length: (cm) 0.7 Width: (cm) 0.7 Depth: (cm) 0.4 Volume: (cm) 0.154 Character of Wound/Ulcer Post Debridement: Improved Severity of Tissue Post Debridement: Fat layer exposed Post Procedure Diagnosis Same as Pre-procedure Electronic Signature(s) Signed: 08/27/2022 10:44:44 AM By: [name] DO Signed: 08/27/2022 6:08:05 PM By: Deon Pilling RN, BSN Entered By: Deon Pilling on 08/27/2022 08:58:47 [name] ([id number]) [id number] Page 3 of 10 -------------------------------------------------------------------------------- HPI Details Patient Name: Date of Service:  [name] [name]. 08/27/2022 8:15 [name] Medical Record Number: [id number] Patient Account Number: [id number] Date of Birth/Sex: Treating RN: August 02, 1951 (36 y.Darren. 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 He is not sure how the wounds started. He has been following with podiatry for this issue. He had removal of the tendon to the right lower extremity by Dr. SBlenda Mounts He has been using Betadine to the wound beds. On 06/11/2022 he had [name] right posterior tibial artery angioplasty by Dr. CCarlis Abbott It was reported  the patient is optimized after revascularization of the right lower extremity. His ABIs on the left were 0.91. He currently denies systemic signs of infection. He also does not wear shoes. He came in with Kerlix wrap to his feet bilaterally. This did not completely cover his feet. 07/23/2022: This is [name] patient of Dr. HJodene Namthat she asked me to take [name] look at last week due to the significant involvement of the muscle and tendon on his right posterior leg wound. He needed an aggressive debridement and asked me if I would be able to perform this on her behalf. The patient is here today for that procedure. When his dressing was removed in clinic today, the wound was teeming with maggots. There is necrotic muscle, tendon, and fat, with [name] thick layer of slough on the wound. 9/21; patient presents for follow-up. He was debrided by Dr. CCeline Ahrat last clinic visit without any issues. He has been placing Dakin's wet-to-dry dressings to the right posterior wound. He has been using Medihoney to the left foot wound. He reports improvement in wound healing. He has no issues or complaints today. 9/28; patient with type 2 diabetes PAD status post revascularization. He underwent retrograde right PTA angioplasty. Last saw Dr. CCarlis Abbotton 9/12 and was felt to have [name] brisk posterior tibial signal at the right ankle. He had [name] pulsatile toe tracing that appeared adequate for healing. He has been using Santyl and Hydrofera Blue on the right Achilles area. He has [name] smaller area on the left fifth plantar metatarsal head They were apparently in the ER on 9/23 with swelling and discoloration above the wrap. They have [name] picture of the leg after the wrap was taken off which looks like that it was excessively tight superiorly 10/6; patient presents for follow-up. We have been using Santyl and Hydrofera Blue to the right lower extremity wound under Kerlix/Coban. T the left foot we Darren have been using silver alginate with  Medihoney. He again comes in with no shoes. He has no issues or complaints today. 10/12; patient presents for follow-up. We have been using Santyl and Hydrofera Blue to the right lower extremity under Kerlix/Coban And silver alginate to the left foot wound. He has no issues today. He denies signs of infection. 10/19; patient presents for follow-up. We continue to use Santyl and Hydrofera Blue to the right lower extremity under Kerlix/Coban and silver alginate to the left foot wound. There is been improvement in  wound healing. Electronic Signature(s) Signed: 08/27/2022 10:44:44 AM By: [name] DO Entered By: [name] on 08/27/2022 09:35:59 -------------------------------------------------------------------------------- Physical Exam Details Patient Name: Date of Service: Darren Nip [facility] [name]. 08/27/2022 8:15 [name] Medical Record Number: [id number] Patient Account Number: [id number] Date of Birth/Sex: Treating RN: [birth year] (65 y.Darren. Hessie [name] Primary Care Provider: [name] Other Clinician: Referring Provider: Treating Provider/Extender: [name] Ngetich, [name] in Treatment: 6 Constitutional respirations regular, non-labored and within target range for patient.. Cardiovascular 2+ dorsalis pedis/posterior tibialis pulses. Psychiatric pleasant and cooperative. Notes Right lower extremity: T the posterior aspect there is An open wound with granulation tissue and slough. Left lower extremity: T the plantar aspect of the fifth [name], [name] ([id number]) [id number] Page 4 of 10 met head there is an open wound with granulation tissue Devitalized tissue and Callus. No signs of surrounding infection T any of the wound beds. Venous Darren stasis dermatitis and lymphedema skin changes bilaterally Electronic Signature(s) Signed: 08/27/2022 10:44:44 AM By: [name] DO Entered By: [name] on 08/27/2022  09:36:34 -------------------------------------------------------------------------------- Physician Orders Details Patient Name: Date of Service: [name]. 08/27/2022 8:15 [name] Medical Record Number: [id number] Patient Account Number: [id number] Date of Birth/Sex: Treating RN: Nov 30, 1950 (14 y.Darren. 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 - Dr. Heber [place] Room 8 Return [name] ppointment in 2 weeks. - Dr. Heber [place] Room 8 Return [name] Other: - [employer]- will ship wound care supplies. Anesthetic (In clinic) Topical Lidocaine 5% applied to wound bed Cellular or Tissue Based Products Cellular or Tissue Based Product Type: - run insurance for [employer] and [employer] to see if approved. Bathing/ Shower/ Hygiene May shower with protection but do not get wound dressing(s) wet. Edema Control - Lymphedema / SCD / Other Elevate legs to the level of the heart or above for 30 minutes daily and/or when sitting, [name] frequency of: - 3-4 times [name] day throughout the day. Avoid standing for long periods of time. Off-Loading Open toe surgical shoe to: - to both feet Other: - minimize walking and standing to aid in offloading pressure to wound. Wound Treatment Wound #1 - Achilles Wound Laterality: Right Cleanser: Dakins Solution 1 x Per Week/30 Days Discharge Instructions: Clean with Dakins Peri-Wound  Care: Sween Lotion (Moisturizing lotion) 1 x Per Week/30 Days Discharge Instructions: Apply moisturizing lotion as directed Prim Dressing: Hydrofera Blue Classic Foam, 4x4 in 1 x Per Week/30 Days ary Discharge Instructions: Moisten with saline prior to applying to wound bed Prim Dressing: Santyl Ointment 1 x Per Week/30 Days ary Discharge Instructions: Apply nickel thick amount to wound bed as instructed Secondary Dressing: ABD Pad, 5x9 1 x Per Week/30 Days Discharge Instructions: Apply over primary dressing as directed. Secondary Dressing: Woven Gauze Sponge, Non-Sterile 4x4 in 1 x Per Week/30 Days Discharge Instructions: Apply over primary dressing as directed. [name], [name] ([id number]) [id number] Page 5 of 10 Compression Wrap: Kerlix Roll 4.5x3.1 (in/yd) 1 x Per Week/30  Days Discharge Instructions: Apply Kerlix and Coban compression as directed. Compression Wrap: Coban Self-Adherent Wrap 4x5 (in/yd) 1 x Per Week/30 Days Discharge Instructions: Apply over Kerlix as directed. Wound #2 - Foot Wound Laterality: Plantar, Left Cleanser: Dakins Solution 1 x Per Day/30 Days Peri-Wound Care: Skin Prep 1 x Per Day/30 Days Discharge Instructions: Use skin prep as directed Prim Dressing: KerraCel Ag Gelling Fiber Dressing, 4x5 in (silver alginate) 1 x Per Day/30 Days ary Discharge Instructions: Apply silver alginate to wound bed as instructed Secondary Dressing: Optifoam Non-Adhesive Dressing, 4x4 in 1 x Per Day/30 Days Discharge Instructions: Apply over as [name] foam donut. Secondary Dressing: Woven Gauze Sponges 2x2 in 1 x Per Day/30 Days Discharge Instructions: Apply over primary dressing as directed. Secured With: [employer], 4.5x3.1 (in/yd) 1 x Per Day/30 Days Discharge Instructions: Secure with Kerlix as directed. Secured With: 12M Medipore H Soft Cloth Surgical T ape, 4 x 10 (in/yd) 1 x Per Day/30 Days Discharge Instructions: Secure with tape as  directed. Electronic Signature(s) Signed: 08/27/2022 10:44:44 AM By: [name] DO Entered By: [name] on 08/27/2022 09:36:41 -------------------------------------------------------------------------------- Problem List Details Patient Name: Date of Service: [name] [name] [facility] [name]. 08/27/2022 8:15 [name] Medical Record Number: [id number] Patient Account Number: [id number] Date of Birth/Sex: Treating RN: [birth year] (53 y.Darren. Hessie [name] Primary Care Provider: [name] Other Clinician: Referring Provider: Treating Provider/Extender: [name], [name] in Treatment: 6 Active Problems ICD-10 Encounter Code Description Active Date MDM Diagnosis L97.518 Non-pressure chronic ulcer of other part of right foot with other specified 07/16/2022 No Yes severity L97.522 Non-pressure chronic ulcer of other part of left foot with fat layer exposed 07/16/2022 No Yes E11.621 Type 2 diabetes mellitus with foot ulcer 07/16/2022 No Yes I73.9 Peripheral vascular disease, unspecified 07/16/2022 No Yes 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 07/23/2022 No Yes muscle [name], [name] ([id number]) [id number] Page 6 of 10 Inactive Problems Resolved Problems Electronic Signature(s) Signed: 08/27/2022 10:44:44 AM By: [name] DO Entered By: [name] on 08/27/2022 09:34:43 -------------------------------------------------------------------------------- Progress Note Details Patient Name: Date of Service: [name]. 08/27/2022 8:15 [name] Medical Record Number: [id number] Patient Account Number: [id number] Date of Birth/Sex: Treating RN: 1951/10/03 (54 y.Darren. Hessie [name] Primary Care Provider: [name] Other Clinician: Referring Provider: Treating Provider/Extender: [name], [name] in Treatment: 6 Subjective Chief Complaint Information  obtained from Patient 07/16/2022; bilateral lower extremity wounds History of Present Illness (HPI) Admission 07/16/2022 Mr. 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 He is not sure how the wounds started. He has been following with podiatry for this issue. He had removal of the tendon to the right lower extremity by Dr. SBlenda Mounts He has been using Betadine to the wound beds. On 06/11/2022 he had [name] right posterior tibial artery angioplasty by Dr. CCarlis Abbott It was reported the patient is optimized after revascularization of the right lower extremity. His ABIs on the left were 0.91. He currently denies systemic signs of infection. He also does not wear shoes. He came in with Kerlix wrap to his feet bilaterally. This did  not completely cover his feet. 07/23/2022: [name] that she asked me to take [name] look at last week due to the significant involvement of the muscle and tendon on his right posterior leg wound. He needed an aggressive debridement and asked me if I would be able to perform this on her behalf. The patient is here today for that procedure. When his dressing was removed in clinic today, the wound was teeming with maggots. There is necrotic muscle, tendon, and fat, with [name] thick layer of slough on the wound. 9/21; patient presents for follow-up. He was debrided by Dr. Celine Ahr at last clinic visit without any issues. He has been placing Dakin's wet-to-dry dressings to the right posterior wound. He has been using Medihoney to the left foot wound. He reports improvement in wound healing. He has no issues or  complaints today. 9/28; patient with type 2 diabetes PAD status post revascularization. He underwent retrograde right PTA angioplasty. Last saw Dr. Carlis Abbott on 9/12 and was felt to have [name] brisk posterior tibial signal at the right ankle. He had [name] pulsatile toe tracing that appeared adequate for healing. He has been using Santyl and Hydrofera Blue on the right Achilles area. oo He has [name] smaller area on the left fifth plantar metatarsal head They were apparently in the ER on 9/23 with swelling and discoloration above the wrap. They have [name] picture of the leg after the wrap was taken off which looks like that it was excessively tight superiorly 10/6; patient presents for follow-up. We have been using Santyl and Hydrofera Blue to the right lower extremity wound under Kerlix/Coban. T the left foot we Darren have been using silver alginate with Medihoney. He again comes in with no shoes. He has no issues or complaints today. 10/12; patient presents for follow-up. We have been using Santyl and Hydrofera Blue to the right lower extremity under Kerlix/Coban And silver alginate to the left foot wound. He has no issues today. He denies signs of infection. 10/19; patient presents for follow-up. We continue to use Santyl and Hydrofera Blue to the right lower extremity under Kerlix/Coban and silver alginate to the left foot wound. There is been improvement in wound healing. Patient History Information obtained from Chart. Family History Diabetes - Mother, Heart Disease - Mother,Siblings. Social History Current every day smoker, Alcohol Use - Never, Drug Use - No History, Caffeine Use - Never. Medical History Respiratory [name], [name] ([id number]) [id number] Page 7 of 10 Patient has history of Sleep Apnea - does not wear Cardiovascular Patient has history of Arrhythmia - [name]. Fib, Congestive Heart Failure - EF 25%, Hypertension, Peripheral Arterial Disease Denies history of  Peripheral Venous Disease Endocrine Patient has history of Type II Diabetes Hospitalization/Surgery History - Angiogram 06/11/2022 Dr. Carlis Abbott [facility]. - cardioversion 11/06/2021, 2015, 2017. 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 Medical [name] Surgical History Notes nd Gastrointestinal Ascites Genitourinary stage III Kidney disease Objective Constitutional respirations regular, non-labored and within target range for patient.. Vitals Time Taken: 8:28 AM, Height: 68 in, Weight: 185 lbs, BMI: 28.1, Temperature: 98.1 F, Pulse: 130 bpm, Respiratory Rate: 20 breaths/min, Blood Pressure: 124/88 mmHg. Cardiovascular 2+ dorsalis pedis/posterior tibialis pulses. Psychiatric pleasant and cooperative. General Notes: Right lower extremity: T the posterior aspect there is An open wound with granulation tissue and slough. Left lower extremity: T the plantar Darren Darren aspect of the fifth met head there is an open wound with granulation tissue Devitalized tissue and Callus. No signs of  surrounding infection T any of the Darren wound beds. Venous stasis dermatitis and lymphedema skin changes bilaterally Integumentary (Hair, Skin) Wound #1 status is Open. Original cause of wound was Surgical Injury. The date acquired was: 07/16/2022. The wound has been in treatment 6 weeks. The wound is located on the Right Achilles. The wound measures 5.9cm length x 2.5cm width x 0.3cm depth; 11.585cm^2 area and 3.475cm^3 volume. There is tendon and Fat Layer (Subcutaneous Tissue) exposed. There is no tunneling or undermining noted. There is [name] medium amount of serosanguineous drainage noted. The wound margin is distinct with the outline attached to the wound base. There is large (67-100%) red, pink granulation within the wound bed. There is [name] small (1- 33%) amount of necrotic tissue within the wound bed including Adherent Slough. The periwound skin appearance had no abnormalities noted for texture. The periwound skin appearance exhibited:  Dry/Scaly. The periwound skin appearance did not exhibit: Maceration, Atrophie Blanche, Cyanosis, Ecchymosis, Hemosiderin Staining, Mottled, Pallor, Rubor, Erythema. Periwound temperature was noted as No Abnormality. Wound #2 status is Open. Original cause of wound was Gradually Appeared. The date acquired was: 03/09/2022. The wound has been in treatment 6 weeks. The wound is located on the [place]. The wound measures 0.7cm length x 0.7cm width x 0.4cm depth; 0.385cm^2 area and 0.154cm^3 volume. There is Fat Layer (Subcutaneous Tissue) exposed. There is no tunneling noted, however, there is undermining starting at 7:00 and ending at 11:00 with [name] maximum distance of 0.7cm. There is [name] medium amount of serosanguineous drainage noted. The wound margin is thickened. There is large (67-100%) pink, pale granulation within the wound bed. There is no necrotic tissue within the wound bed. The periwound skin appearance exhibited: Callus. The periwound skin appearance did not exhibit: Crepitus, Excoriation, Induration, Rash, Scarring, Dry/Scaly, Maceration, Atrophie Blanche, Cyanosis, Ecchymosis, Hemosiderin Staining, Mottled, Pallor, Rubor, Erythema. Periwound temperature was noted as No Abnormality. 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 I debrided nonviable tissue. I recommended continuing the course with Hydrofera Blue and Santyl under Kerlix/Coban to the right lower extremity and silver alginate to the left foot wound. Continue aggressive offloading to the left foot. Procedures Wound  #1 [name], [name] ([id number]) [id number] Page 8 of 10 Pre-procedure diagnosis of Wound #1 is [name] Diabetic Wound/Ulcer of the Lower Extremity located on the Right Achilles .Severity of Tissue Pre Debridement is: Fat layer exposed. There was [name] Excisional Skin/Subcutaneous Tissue Debridement with [name] total area of 15 sq cm performed by [name], DO. With the following instrument(s): Curette to remove Viable and Non-Viable tissue/material. Material removed includes Subcutaneous Tissue, Slough, Skin: Dermis, and Skin: Epidermis after achieving pain control using Lidocaine 5% topical ointment. [name] time out was conducted at 08:50, prior to the start of the procedure. [name] Minimum amount of bleeding was controlled with Pressure. The procedure was tolerated well with [name] pain level of 0 throughout and [name] pain level of 0 following the procedure. Post Debridement Measurements: 5.9cm length x 2.5cm width x 0.3cm depth; 3.475cm^3 volume. Character of Wound/Ulcer Post Debridement is improved. Severity of Tissue Post Debridement is: Fat layer exposed. Post procedure Diagnosis Wound #1: Same  as Pre-Procedure General Notes: documented by Deon Pilling, RN.Marland Kitchen Wound #2 Pre-procedure diagnosis of Wound #2 is [name] Diabetic Wound/Ulcer of the Lower Extremity located on the [place] .Severity of Tissue Pre Debridement is: Fat layer exposed. There was [name] Excisional Skin/Subcutaneous Tissue Debridement with [name] total area of 1 sq cm performed by [name], DO. With the following instrument(s): Curette to remove Viable and Non-Viable tissue/material. Material removed includes Callus, Subcutaneous Tissue, and Slough after achieving pain control using Lidocaine 5% topical ointment. [name] time out was conducted at 08:50, prior to the start of the procedure. [name] Minimum amount of bleeding was controlled with Pressure. The procedure was tolerated well with [name] pain level of 0 throughout and [name] pain  level of 0 following the procedure. Post Debridement Measurements: 0.7cm length x 0.7cm width x 0.4cm depth; 0.154cm^3 volume. Character of Wound/Ulcer Post Debridement is improved. Severity of Tissue Post Debridement is: Fat layer exposed. Post procedure Diagnosis Wound #2: Same as Pre-Procedure Plan Follow-up Appointments: Return Appointment in 1 week. - Dr. Heber [place] Room 8 Return Appointment in 2 weeks. - Dr. Heber [place] Room 8 Other: - [facility]- will ship wound care supplies. Anesthetic: (In clinic) Topical Lidocaine 5% applied to wound bed Cellular or Tissue Based Products: Cellular or Tissue Based Product Type: - run insurance for [employer] to see if approved. Bathing/ Shower/ Hygiene: May shower with protection but do not get wound dressing(s) wet. Edema Control - Lymphedema / SCD / Other: Elevate legs to the level of the heart or above for 30 minutes daily and/or when sitting, [name] frequency of: - 3-4 times [name] day throughout the day. Avoid standing for long periods of time. Off-Loading: Open toe surgical shoe to: - to both feet Other: - minimize walking and standing to aid in offloading pressure to wound. WOUND #1: - Achilles Wound Laterality: Right Cleanser: Dakins Solution 1 x Per Week/30 Days Discharge Instructions: Clean with Dakins Peri-Wound Care: Sween Lotion (Moisturizing lotion) 1 x Per Week/30 Days Discharge Instructions: Apply moisturizing lotion as directed Prim Dressing: Hydrofera Blue Classic Foam, 4x4 in 1 x Per Week/30 Days ary Discharge Instructions: Moisten with saline prior to applying to wound bed Prim Dressing: Santyl Ointment 1 x Per Week/30 Days ary Discharge Instructions: Apply nickel thick amount to wound bed as instructed Secondary Dressing: ABD Pad, 5x9 1 x Per Week/30 Days Discharge Instructions: Apply over primary dressing as directed. Secondary Dressing: Woven Gauze Sponge, Non-Sterile 4x4 in 1 x Per Week/30 Days Discharge Instructions: Apply  over primary dressing as directed. Com pression Wrap: Kerlix Roll 4.5x3.1 (in/yd) 1 x Per Week/30 Days Discharge Instructions: Apply Kerlix and Coban compression as directed. Com pression Wrap: Coban Self-Adherent Wrap 4x5 (in/yd) 1 x Per Week/30 Days Discharge Instructions: Apply over Kerlix as directed. WOUND #2: - Foot Wound Laterality: Plantar, Left Cleanser: Dakins Solution 1 x Per Day/30 Days Peri-Wound Care: Skin Prep 1 x Per Day/30 Days Discharge Instructions: Use skin prep as directed Prim Dressing: KerraCel Ag Gelling Fiber Dressing, 4x5 in (silver alginate) 1 x Per Day/30 Days ary Discharge Instructions: Apply silver alginate to wound bed as instructed Secondary Dressing: Optifoam Non-Adhesive Dressing, 4x4 in 1 x Per Day/30 Days Discharge Instructions: Apply over as [name] foam donut. Secondary Dressing: Woven Gauze Sponges 2x2 in 1 x Per Day/30 Days Discharge Instructions: Apply over primary dressing as directed. Secured With: [employer], 4.5x3.1 (in/yd) 1 x Per Day/30 Days Discharge Instructions: Secure with Kerlix as directed. Secured With: 7M Medipore H Soft  Cloth Surgical T ape, 4 x 10 (in/yd) 1 x Per Day/30 Days Discharge Instructions: Secure with tape as directed. 1. In office sharp debridement 2. Hydrofera Blue and Santyl under Kerlix/Cobanooright lower extremity 3. Silver alginateooleft foot 4. Follow-up in 1 week 5. Aggressive offloading - Surgical shoe with foam donut Electronic Signature(s) Signed: 08/27/2022 10:44:44 AM By: [name] DO Entered By: [name] on 08/27/2022 09:39:32 [name] ([id number]) [id number] Page 9 of 10 -------------------------------------------------------------------------------- HxROS Details Patient Name: Date of Service: [name] [facility] [name]. 08/27/2022 8:15 [name] Medical Record Number: [id number] Patient Account Number: [id number] Date of Birth/Sex: Treating RN: 1951/01/17  (48 y.Darren. Hessie [name] Primary Care Provider: [name] Other Clinician: Referring Provider: Treating Provider/Extender: [name] Ngetich, [name] in Treatment: 6 Information Obtained From Chart Respiratory Medical History: Positive for: Sleep Apnea - does not wear Cardiovascular Medical History: Positive for: Arrhythmia - [name]. Fib; Congestive Heart Failure - EF 25%; Hypertension; Peripheral Arterial Disease Negative for: Peripheral Venous Disease Gastrointestinal Medical History: Past Medical History Notes: Ascites Endocrine Medical History: Positive for: Type II Diabetes Time with diabetes: 6 years Treated with: Insulin Blood sugar tested every day: No Genitourinary Medical History: Past Medical History Notes: stage III Kidney disease Immunizations Pneumococcal Vaccine: Received Pneumococcal Vaccination: No Implantable Devices No devices added Hospitalization / Surgery History Type of Hospitalization/Surgery Angiogram 06/11/2022 Dr. 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 Family and Social History Diabetes: Yes - Mother; Heart Disease: Yes - Mother,Siblings; Current every day smoker; Alcohol Use: Never; Drug Use: No History; Caffeine Use: Never; Financial Concerns: No; Food, Clothing or Shelter Needs: No; Support System Lacking: No; Transportation Concerns: No Electronic Signature(s) Signed: 08/27/2022 10:44:44 AM By: [name] DO Signed: 08/27/2022 6:08:05 PM By: Deon Pilling RN, BSN Entered By: [name] on 08/27/2022 09:36:05 [name] ([id number]) [id number] Page 10 of 10 -------------------------------------------------------------------------------- SuperBill Details Patient Name: Date of Service: [name] [facility] [name]. 08/27/2022 Medical Record Number: [id number] Patient Account Number: [id number] Date of Birth/Sex: Treating RN: Apr 16, 1951 (31 y.Darren.  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 Facility Procedures : CPT4 Code: [id number] Description: 83151 - DEB SUBQ TISSUE 20 SQ CM/< ICD-10 Diagnosis Description L97.518 Non-pressure chronic ulcer of other part of right foot with other specified sev L97.522 Non-pressure chronic ulcer of other part of left foot with fat layer exposed Modifier: erity Quantity: 1 Physician Procedures : CPT4 Code Description Modifier [id number] - WC PHYS SUBQ TISS 20 SQ CM ICD-10 Diagnosis Description L97.518 Non-pressure chronic ulcer of other part of right foot with other specified severity L97.522 Non-pressure chronic ulcer of other part of left  foot with fat layer exposed Quantity: 1 Electronic Signature(s) Signed: 08/27/2022 10:44:44 AM By: [name] DO Entered By: [name] on 08/27/2022 09:39:43

## 2022-08-28 NOTE — Progress Notes (Signed)
MILAM, HURLESS A ([id number]) [id number] Page 1 of 10 Visit Report for 08/27/2022 Arrival Information Details Patient Name: Date of Service: [name] [facility] A. 08/27/2022 8:15 A M Medical Record Number: [id number] Patient Account Number: [id number] Date of Birth/Sex: Treating RN: [birth year] (71 y.o. [name] Primary Care Hedda Crumbley: [name] Other Clinician: Referring Kimley Apsey: Treating Kainalu Heggs/Extender: [name] Ngetich, [name] in Treatment: 6 Visit Information History Since Last Visit Added or deleted any medications: No Patient Arrived: [name] Any new allergies or adverse reactions: No Arrival Time: 08:27 Had a fall or experienced change in No Accompanied By: daughter activities of daily living that may affect Transfer Assistance: None risk of falls: Patient Identification Verified: Yes Signs or symptoms of abuse/neglect since last visito No Secondary Verification Process Completed: Yes Hospitalized since last visit: No Patient Requires Transmission-Based Precautions: No Implantable device outside of the clinic excluding No Patient Has Alerts: Yes cellular tissue based products placed in the center Patient Alerts: Patient on Blood Thinner since last visit: Has Dressing in Place as Prescribed: Yes Has Compression in Place as Prescribed: Yes Pain Present Now: No Electronic Signature(s) Signed: 08/27/2022 6:08:05 PM By: Deon Pilling RN, BSN Entered By: Deon Pilling on 08/27/2022 08:28:03 -------------------------------------------------------------------------------- Encounter Discharge Information Details Patient Name: Date of Service: Darren Nip [name] 08/27/2022 8:15 A M Medical Record Number: [id number] Patient Account Number: [id number] Date of Birth/Sex: Treating RN: Dec 21, 1950 (71 y.o. [name] Primary Care Narada Uzzle: [name] Other Clinician: Referring Eudora Guevarra: Treating Verle Brillhart/Extender:  [name] Ngetich, [name] in Treatment: 6 Encounter Discharge Information Items Post Procedure Vitals Discharge Condition: Stable Temperature (F): 98.1 Ambulatory Status: Cane Pulse (bpm): 128 Discharge Destination: Home Respiratory Rate (breaths/min): 20 Transportation: Private Auto Blood Pressure (mmHg): 124/88 Accompanied By: daughter Schedule Follow-up Appointment: Yes Clinical Summary of Care: Notes Manual heart rate checked at 128. Per daughter patient has not taken his heart medication this morning. Electronic Signature(s) Signed: 08/27/2022 6:08:05 PM By: Deon Pilling RN, BSN Entered By: Deon Pilling on 08/27/2022 09:02:07 Cordie Grice ([id number]) [id number] Page 2 of 10 -------------------------------------------------------------------------------- Lower Extremity Assessment Details Patient Name: Date of Service: [name] [facility] A. 08/27/2022 8:15 A M Medical Record Number: [id number] Patient Account Number: [id number] Date of Birth/Sex: Treating RN: 02/15/1951 (71 y.o. [name] Primary Care Faheem Ziemann: [name] Other Clinician: Referring Enmanuel Zufall: Treating Dehaven Sine/Extender: [name] Ngetich, [name] in Treatment: 6 Edema Assessment Assessed: [Left: Yes] [Right: Yes] Edema: [Left: Yes] [Right: No] Calf Left: Right: Point of Measurement: 39 cm From Medial Instep 37 cm 36.5 cm Ankle Left: Right: Point of Measurement: 8 cm From Medial Instep 24 cm 24 cm Vascular Assessment Pulses: Dorsalis Pedis Palpable: [Left:Yes] [Right:Yes] Electronic Signature(s) Signed: 08/27/2022 6:08:05 PM By: Deon Pilling RN, BSN Entered By: Deon Pilling on 08/27/2022 08:37:53 -------------------------------------------------------------------------------- Multi Wound Chart Details Patient Name: Date of Service: Darren Nip [name] 08/27/2022 8:15 A M Medical Record Number: [id number] Patient Account  Number: [id number] Date of Birth/Sex: Treating RN: 1951/10/02 (71 y.o. [name] Primary Care Gladiola Madore: [name] Other Clinician: Referring Desirai Traxler: Treating Saory Carriero/Extender: [name] Ngetich, [name] in Treatment: 6 Vital Signs Height(in): 68 Pulse(bpm): 130 Weight(lbs): 185 Blood Pressure(mmHg): 124/88 Body Mass Index(BMI): 28.1 Temperature(F): 98.1 Respiratory Rate(breaths/min): 20 [1:Photos:] [N/A:N/A [id number] Page 3 of 10] Right Achilles Left, Plantar Foot N/A Wound Location: Surgical Injury Gradually Appeared N/A Wounding Event: Diabetic Wound/Ulcer of the Lower Diabetic Wound/Ulcer of the Lower N/A Primary Etiology: Extremity  Extremity Sleep Apnea, Arrhythmia, Congestive Sleep Apnea, Arrhythmia, Congestive N/A Comorbid History: Heart Failure, Hypertension, Peripheral Heart Failure, Hypertension, Peripheral Arterial Disease, Type II Diabetes Arterial Disease, Type II Diabetes 07/16/2022 03/09/2022 N/A Date Acquired: 6 6 N/A Weeks of Treatment: Open Open N/A Wound Status: No No N/A Wound Recurrence: 5.9x2.5x0.3 0.7x0.7x0.4 N/A Measurements L x W x D (cm) 11.585 0.385 N/A A (cm) : rea 3.475 0.154 N/A Volume (cm) : 43.30% -75.00% N/A % Reduction in A rea: 66.00% -133.30% N/A % Reduction in Volume: 7 Starting Position 1 (o'clock): 11 Ending Position 1 (o'clock): 0.7 Maximum Distance 1 (cm): No Yes N/A Undermining: Grade 2 Grade 2 N/A Classification: Medium Medium N/A Exudate A mount: Serosanguineous Serosanguineous N/A Exudate Type: red, brown red, brown N/A Exudate Color: Distinct, outline attached Thickened N/A Wound Margin: Large (67-100%) Large (67-100%) N/A Granulation A mount: Red, Pink Pink, Pale N/A Granulation Quality: Small (1-33%) None Present (0%) N/A Necrotic A mount: Fat Layer (Subcutaneous Tissue): Yes Fat Layer (Subcutaneous Tissue): Yes N/A Exposed Structures: Tendon:  Yes Fascia: No Fascia: No Tendon: No Muscle: No Muscle: No Joint: No Joint: No Bone: No Bone: No Medium (34-66%) None N/A Epithelialization: Debridement - Excisional Debridement - Excisional N/A Debridement: Pre-procedure Verification/Time Out 08:50 08:50 N/A Taken: Lidocaine 5% topical ointment Lidocaine 5% topical ointment N/A Pain Control: Subcutaneous, Slough Callus, Subcutaneous, Slough N/A Tissue Debrided: Skin/Subcutaneous Tissue Skin/Subcutaneous Tissue N/A Level: 15 1 N/A Debridement A (sq cm): rea Curette Curette N/A Instrument: Minimum Minimum N/A Bleeding: Pressure Pressure N/A Hemostasis A chieved: 0 0 N/A Procedural Pain: 0 0 N/A Post Procedural Pain: Procedure was tolerated well Procedure was tolerated well N/A Debridement Treatment Response: 5.9x2.5x0.3 0.7x0.7x0.4 N/A Post Debridement Measurements L x W x D (cm) 3.475 0.154 N/A Post Debridement Volume: (cm) Excoriation: No Callus: Yes N/A Periwound Skin Texture: Induration: No Excoriation: No Callus: No Induration: No Crepitus: No Crepitus: No Rash: No Rash: No Scarring: No Scarring: No Dry/Scaly: Yes Maceration: No N/A Periwound Skin Moisture: Maceration: No Dry/Scaly: No Atrophie Blanche: No Atrophie Blanche: No N/A Periwound Skin Color: Cyanosis: No Cyanosis: No Ecchymosis: No Ecchymosis: No Erythema: No Erythema: No Hemosiderin Staining: No Hemosiderin Staining: No Mottled: No Mottled: No Pallor: No Pallor: No Rubor: No Rubor: No No Abnormality No Abnormality N/A Temperature: Debridement Debridement N/A Procedures Performed: Treatment Notes Wound #1 (Achilles) Wound Laterality: Right Cleanser Dakins Solution Discharge Instruction: Clean with Dakins Peri-Wound Care Sween Lotion (Moisturizing lotion) Discharge Instruction: Apply moisturizing lotion as directed Topical REAFORD, MIODUSZEWSKI A ([id number]) [phone].pdf Page 4 of 10 Primary  Dressing Hydrofera Blue Classic Foam, 4x4 in Discharge Instruction: Moisten with saline prior to applying to wound bed Santyl Ointment Discharge Instruction: Apply nickel thick amount to wound bed as instructed Secondary Dressing ABD Pad, 5x9 Discharge Instruction: Apply over primary dressing as directed. Woven Gauze Sponge, Non-Sterile 4x4 in Discharge Instruction: Apply over primary dressing as directed. Secured With Compression Wrap Kerlix Roll 4.5x3.1 (in/yd) Discharge Instruction: Apply Kerlix and Coban compression as directed. Coban Self-Adherent Wrap 4x5 (in/yd) Discharge Instruction: Apply over Kerlix as directed. Compression Stockings Add-Ons Wound #2 (Foot) Wound Laterality: Plantar, Left Cleanser Dakins Solution Peri-Wound Care Skin Prep Discharge Instruction: Use skin prep as directed Topical Primary Dressing KerraCel Ag Gelling Fiber Dressing, 4x5 in (silver alginate) Discharge Instruction: Apply silver alginate to wound bed as instructed Secondary Dressing Optifoam Non-Adhesive Dressing, 4x4 in Discharge Instruction: Apply over as a foam donut. Woven Gauze Sponges 2x2 in Discharge Instruction: Apply over primary dressing as directed. Secured  With [employer] Sterile, 4.5x3.1 (in/yd) Discharge Instruction: Secure with Kerlix as directed. 64M Medipore H Soft Cloth Surgical T ape, 4 x 10 (in/yd) Discharge Instruction: Secure with tape as directed. Compression Wrap Compression Stockings Add-Ons Electronic Signature(s) Signed: 08/27/2022 10:44:44 AM By: [name] DO Signed: 08/27/2022 6:08:05 PM By: Deon Pilling RN, BSN Entered By: [name] on 08/27/2022 09:35:12 -------------------------------------------------------------------------------- Multi-Disciplinary Care Plan Details Patient Name: Date of Service: Jenetta Downer [name] [name] 08/27/2022 8:15 A Aggie Cosier ([id number].pdf Page 5 of 10 Medical  Record Number: [id number] Patient Account Number: [id number] Date of Birth/Sex: Treating RN: [birth year] (71 y.o. [name] Primary Care Saphia Vanderford: [name] Other Clinician: Referring Shanah Guimaraes: Treating Cederic Mozley/Extender: [name] Ngetich, [name] in Treatment: 6 Active Inactive Nutrition Nursing Diagnoses: Potential for alteratiion in Nutrition/Potential for imbalanced nutrition Goals: Patient/caregiver agrees to and verbalizes understanding of need to use nutritional supplements and/or vitamins as prescribed Date Initiated: 07/16/2022 Target Resolution Date: 09/17/2022 Goal Status: Active Patient/caregiver will maintain therapeutic glucose control Date Initiated: 07/16/2022 Target Resolution Date: 09/17/2022 Goal Status: Active Interventions: Assess HgA1c results as ordered upon admission and as needed Provide education on nutrition Treatment Activities: Education provided on Nutrition : 08/20/2022 Obtain HgA1c : 07/16/2022 Patient referred to Primary Care Physician for further nutritional evaluation : 07/16/2022 Notes: Pain, Acute or Chronic Nursing Diagnoses: Pain, acute or chronic: actual or potential Potential alteration in comfort, pain Goals: Patient will verbalize adequate pain control and receive pain control interventions during procedures as needed Date Initiated: 07/16/2022 Target Resolution Date: 09/17/2022 Goal Status: Active Interventions: Encourage patient to take pain medications as prescribed Provide education on pain management Reposition patient for comfort Treatment Activities: Administer pain control measures as ordered : 07/16/2022 Notes: Electronic Signature(s) Signed: 08/27/2022 6:08:05 PM By: Deon Pilling RN, BSN Entered By: Deon Pilling on 08/27/2022 08:40:34 -------------------------------------------------------------------------------- Pain Assessment Details Patient Name: Date of Service: Darren Nip [name] 08/27/2022  8:15 A M Medical Record Number: [id number] Patient Account Number: [id number] Date of Birth/Sex: Treating RN: 07-17-1951 (71 y.o. [name] Primary Care Johonna Binette: [name] Other Clinician: Referring Shadrick Senne: Treating Andreyah Natividad/Extender: [name] in Treatment: 6 SHERRILL, TESCHENDORF A ([id number]) [id number] Page 6 of 10 Active Problems Location of Pain Severity and Description of Pain Patient Has Paino No Site Locations Rate the pain. Current Pain Level: 0 Pain Management and Medication Current Pain Management: Medication: No Cold Application: No Rest: No Massage: No Activity: No T.E.N.S.: No Heat Application: No Leg drop or elevation: No Is the Current Pain Management Adequate: Adequate How does your wound impact your activities of daily livingo Sleep: No Bathing: No Appetite: No Relationship With Others: No Bladder Continence: No Emotions: No Bowel Continence: No Work: No Toileting: No Drive: No Dressing: No Hobbies: No Engineer, maintenance) Signed: 08/27/2022 6:08:05 PM By: Deon Pilling RN, BSN Entered By: Deon Pilling on 08/27/2022 08:29:14 -------------------------------------------------------------------------------- Patient/Caregiver Education Details Patient Name: Date of Service: Neal Dy A. 10/19/2023andnbsp8:15 A M Medical Record Number: [id number] Patient Account Number: [id number] Date of Birth/Gender: Treating RN: Aug 22, 1951 (71 y.o. [name] Primary Care Physician: [name] Other Clinician: Referring Physician: Treating Physician/Extender: [name] in Treatment: 6 Education Assessment Education Provided To: Patient Education Topics Provided Nutrition: Handouts: Elevated Blood Sugars: How Do They Affect Wound Healing Methods: Explain/Verbal Responses: Reinforcements needed ZYMIERE, ISHII A ([id number])  [id number] Page 7 of 10 Electronic Signature(s) Signed: 08/27/2022 6:08:05 PM By: Deon Pilling RN,  BSN Entered By: Deon Pilling on 08/27/2022 08:40:45 -------------------------------------------------------------------------------- Wound Assessment Details Patient Name: Date of Service: [name] [facility] A. 08/27/2022 8:15 A M Medical Record Number: [id number] Patient Account Number: [id number] Date of Birth/Sex: Treating RN: [birth year] (71 y.o. [name] Primary Care Demichael Traum: [name] Other Clinician: Referring Ailin Rochford: Treating Amare Kontos/Extender: [name] Ngetich, [name] in Treatment: 6 Wound Status Wound Number: 1 Primary Diabetic Wound/Ulcer of the Lower Extremity Etiology: Wound Location: Right Achilles Wound Open Wounding Event: Surgical Injury Status: Date Acquired: 07/16/2022 Comorbid Sleep Apnea, Arrhythmia, Congestive Heart Failure, Hypertension, Weeks Of Treatment: 6 History: Peripheral Arterial Disease, Type II Diabetes Clustered Wound: No Photos Wound Measurements Length: (cm) 5.9 Width: (cm) 2.5 Depth: (cm) 0.3 Area: (cm) 11.585 Volume: (cm) 3.475 % Reduction in Area: 43.3% % Reduction in Volume: 66% Epithelialization: Medium (34-66%) Tunneling: No Undermining: No Wound Description Classification: Grade 2 Wound Margin: Distinct, outline attached Exudate Amount: Medium Exudate Type: Serosanguineous Exudate Color: red, brown Foul Odor After Cleansing: No Slough/Fibrino Yes Wound Bed Granulation Amount: Large (67-100%) Exposed Structure Granulation Quality: Red, Pink Fascia Exposed: No Necrotic Amount: Small (1-33%) Fat Layer (Subcutaneous Tissue) Exposed: Yes Necrotic Quality: Adherent Slough Tendon Exposed: Yes Muscle Exposed: No Joint Exposed: No Bone Exposed: No Periwound Skin Texture Texture Color No Abnormalities Noted: Yes No Abnormalities Noted: No Atrophie Blanche:  No Moisture Cyanosis: No No Abnormalities Noted: No Ecchymosis: No Dry / Scaly: Yes Erythema: No Maceration: No Hemosiderin Staining: No TAMMY, ERICSSON A ([id number]) [id number] Page 8 of 10 Mottled: No Pallor: No Rubor: No Temperature / Pain Temperature: No Abnormality Treatment Notes Wound #1 (Achilles) Wound Laterality: Right Cleanser Dakins Solution Discharge Instruction: Clean with Dakins Peri-Wound Care Sween Lotion (Moisturizing lotion) Discharge Instruction: Apply moisturizing lotion as directed Topical Primary Dressing Hydrofera Blue Classic Foam, 4x4 in Discharge Instruction: Moisten with saline prior to applying to wound bed Santyl Ointment Discharge Instruction: Apply nickel thick amount to wound bed as instructed Secondary Dressing ABD Pad, 5x9 Discharge Instruction: Apply over primary dressing as directed. Woven Gauze Sponge, Non-Sterile 4x4 in Discharge Instruction: Apply over primary dressing as directed. Secured With Compression Wrap Kerlix Roll 4.5x3.1 (in/yd) Discharge Instruction: Apply Kerlix and Coban compression as directed. Coban Self-Adherent Wrap 4x5 (in/yd) Discharge Instruction: Apply over Kerlix as directed. Compression Stockings Add-Ons Electronic Signature(s) Signed: 08/27/2022 4:05:37 PM By: Rhae Hammock RN Signed: 08/27/2022 6:08:05 PM By: Deon Pilling RN, BSN Entered By: Rhae Hammock on 08/27/2022 08:40:01 -------------------------------------------------------------------------------- Wound Assessment Details Patient Name: Date of Service: Darren Nip [facility] A. 08/27/2022 8:15 A M Medical Record Number: [id number] Patient Account Number: [id number] Date of Birth/Sex: Treating RN: 1951/03/10 (71 y.o. [name] Primary Care Falon Flinchum: [name] Other Clinician: Referring Loudon Krakow: Treating Ibrahima Holberg/Extender: [name] Ngetich, [name] in Treatment: 6 Wound  Status Wound Number: 2 Primary Diabetic Wound/Ulcer of the Lower Extremity Etiology: Wound Location: Left, Plantar Foot Wound Open Wounding Event: Gradually Appeared Status: Date Acquired: 03/09/2022 Comorbid Sleep Apnea, Arrhythmia, Congestive Heart Failure, Hypertension, Weeks Of Treatment: 6 History: Peripheral Arterial Disease, Type II Diabetes Clustered Wound: No KARRINGTON, STUDNICKA A ([id number]) [id number] Page 9 of 10 Photos Wound Measurements Length: (cm) 0.7 Width: (cm) 0.7 Depth: (cm) 0.4 Area: (cm) 0.385 Volume: (cm) 0.154 % Reduction in Area: -75% % Reduction in Volume: -133.3% Epithelialization: None Tunneling: No Undermining: Yes Starting Position (o'clock): 7 Ending Position (o'clock): 11 Maximum Distance: (cm) 0.7 Wound Description Classification: Grade 2 Wound Margin: Thickened Exudate Amount: Medium Exudate Type: Serosanguineous Exudate  Color: red, brown Foul Odor After Cleansing: No Slough/Fibrino No Wound Bed Granulation Amount: Large (67-100%) Exposed Structure Granulation Quality: Pink, Pale Fascia Exposed: No Necrotic Amount: None Present (0%) Fat Layer (Subcutaneous Tissue) Exposed: Yes Tendon Exposed: No Muscle Exposed: No Joint Exposed: No Bone Exposed: No Periwound Skin Texture Texture Color No Abnormalities Noted: No No Abnormalities Noted: No Callus: Yes Atrophie Blanche: No Crepitus: No Cyanosis: No Excoriation: No Ecchymosis: No Induration: No Erythema: No Rash: No Hemosiderin Staining: No Scarring: No Mottled: No Pallor: No Moisture Rubor: No No Abnormalities Noted: No Dry / Scaly: No Temperature / Pain Maceration: No Temperature: No Abnormality Treatment Notes Wound #2 (Foot) Wound Laterality: Plantar, Left Cleanser Dakins Solution Peri-Wound Care Skin Prep Discharge Instruction: Use skin prep as directed Topical Primary Dressing KerraCel Ag Gelling Fiber Dressing, 4x5 in (silver  alginate) Discharge Instruction: Apply silver alginate to wound bed as instructed Secondary Dressing Optifoam Non-Adhesive Dressing, 4x4 in ORLONDO, AKHTER A ([id number]) [phone].pdf Page 10 of 10 Discharge Instruction: Apply over as a foam donut. Woven Gauze Sponges 2x2 in Discharge Instruction: Apply over primary dressing as directed. Secured With [employer], 4.5x3.1 (in/yd) Discharge Instruction: Secure with Kerlix as directed. 15M Medipore H Soft Cloth Surgical T ape, 4 x 10 (in/yd) Discharge Instruction: Secure with tape as directed. Compression Wrap Compression Stockings Add-Ons Electronic Signature(s) Signed: 08/27/2022 4:05:37 PM By: Rhae Hammock RN Signed: 08/27/2022 6:08:05 PM By: Deon Pilling RN, BSN Entered By: Rhae Hammock on 08/27/2022 08:40:17 -------------------------------------------------------------------------------- Vitals Details Patient Name: Date of Service: Darren Nip [name] 08/27/2022 8:15 A M Medical Record Number: [id number] Patient Account Number: [id number] Date of Birth/Sex: Treating RN: 1951-03-04 (71 y.o. [name] Primary Care Delrae Hagey: [name] Other Clinician: Referring Julyana Woolverton: Treating Jemila Camille/Extender: [name] Ngetich, [name] in Treatment: 6 Vital Signs Time Taken: 08:28 Temperature (F): 98.1 Height (in): 68 Pulse (bpm): 130 Weight (lbs): 185 Respiratory Rate (breaths/min): 20 Body Mass Index (BMI): 28.1 Blood Pressure (mmHg): 124/88 Reference Range: 80 - 120 mg / dl Electronic Signature(s) Signed: 08/27/2022 6:08:05 PM By: Deon Pilling RN, BSN Entered By: Deon Pilling on 08/27/2022 [id number]

## 2022-09-03 ENCOUNTER — Encounter (HOSPITAL_BASED_OUTPATIENT_CLINIC_OR_DEPARTMENT_OTHER): Payer: Medicare HMO | Admitting: Internal Medicine

## 2022-09-03 DIAGNOSIS — I110 Hypertensive heart disease with heart failure: Secondary | ICD-10-CM | POA: Diagnosis not present

## 2022-09-03 DIAGNOSIS — L97522 Non-pressure chronic ulcer of other part of left foot with fat layer exposed: Secondary | ICD-10-CM | POA: Diagnosis not present

## 2022-09-03 DIAGNOSIS — E1140 Type 2 diabetes mellitus with diabetic neuropathy, unspecified: Secondary | ICD-10-CM | POA: Diagnosis not present

## 2022-09-03 DIAGNOSIS — L97518 Non-pressure chronic ulcer of other part of right foot with other specified severity: Secondary | ICD-10-CM | POA: Diagnosis not present

## 2022-09-03 DIAGNOSIS — E11621 Type 2 diabetes mellitus with foot ulcer: Secondary | ICD-10-CM | POA: Diagnosis not present

## 2022-09-03 DIAGNOSIS — L97813 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: Secondary | ICD-10-CM

## 2022-09-03 DIAGNOSIS — I5022 Chronic systolic (congestive) heart failure: Secondary | ICD-10-CM | POA: Diagnosis not present

## 2022-09-03 DIAGNOSIS — E1151 Type 2 diabetes mellitus with diabetic peripheral angiopathy without gangrene: Secondary | ICD-10-CM | POA: Diagnosis not present

## 2022-09-03 DIAGNOSIS — Z794 Long term (current) use of insulin: Secondary | ICD-10-CM | POA: Diagnosis not present

## 2022-09-03 NOTE — Progress Notes (Signed)
Darren, [name] [name] ([id number]) [id number] Page 1 of 12 Visit Report for 09/03/2022 Arrival Information Details Patient Name: Date of Service: [name]. 09/03/2022 8:15 [name]: [id number] Patient Account Number: [id number] Date of Birth/Sex: Treating RN: 1951/03/20 (71 y.Darren. [name] Primary Care Yoon Barca: [name] Other Clinician: Referring Virlee Stroschein: Treating [name]: [name], [name] in Treatment: 7 Visit Information History Since Last Visit Added or deleted any medications: No Patient Arrived: [name] Any new allergies or adverse reactions: No Arrival Time: 08:28 Had [name] fall or experienced change in No Accompanied By: daughter activities of daily living that may affect Transfer Assistance: None risk of falls: Patient Identification Verified: Yes Signs or symptoms of abuse/neglect since last visito No Secondary Verification Process Completed: Yes Hospitalized since last visit: No Patient Requires Transmission-Based Precautions: No Implantable device outside of the clinic excluding No Patient Has Alerts: Yes cellular tissue based products placed in the center Patient Alerts: Patient on Blood Thinner since last visit: Has Dressing in Place as Prescribed: Yes Has Compression in Place as Prescribed: Yes Pain Present Now: No Notes bedroom shoes patient wears- feet are sweating causing moisture with the feet. Electronic Signature(s) Signed: 09/03/2022 5:00:15 PM By: Darren Pilling RN, BSN Entered By: [name] on 09/03/2022 08:33:16 -------------------------------------------------------------------------------- Clinic Level of Care Assessment Details Patient Name: Date of Service: Darren DRO [name] [facility] [name]. 09/03/2022 8:15 [name]: [id number] Patient Account Number: [id number] Date of Birth/Sex: Treating RN: 24-Sep-1951 (75 y.Darren. [name] Primary Care  Shaletha Humble: [name] Other Clinician: Referring [name]: Treating [name]/Extender: [name], [name] in Treatment: 7 Clinic Level of Care Assessment Items TOOL 4 Quantity Score X- 1 0 Use when only an EandM is performed on FOLLOW-UP visit ASSESSMENTS - Nursing Assessment / Reassessment X- 1 10 Reassessment of Co-morbidities (includes updates in patient status) X- 1 5 Reassessment of Adherence to Treatment Plan ASSESSMENTS - Wound and Skin [name] ssessment / Reassessment X - Simple Wound Assessment / Reassessment - one wound 1 5 []  - 0 Complex Wound Assessment / Reassessment - multiple wounds []  - 0 Dermatologic / Skin Assessment (not related to wound area) ASSESSMENTS - Focused Assessment [name], [name] ([id number]) [id number] Page 2 of 12 []  - 0 Circumferential Edema Measurements - multi extremities []  - 0 Nutritional Assessment / Counseling / Intervention []  - 0 Lower Extremity Assessment (monofilament, tuning fork, pulses) []  - 0 Peripheral Arterial Disease Assessment (using hand held doppler) ASSESSMENTS - Ostomy and/or Continence Assessment and Care []  - 0 Incontinence Assessment and Management []  - 0 Ostomy Care Assessment and Management (repouching, etc.) PROCESS - Coordination of Care X - Simple Patient / Family Education for ongoing care 1 15 []  - 0 Complex (extensive) Patient / Family Education for ongoing care X- 1 10 Staff obtains Programmer, systems, Records, T Results / Process Orders est []  - 0 Staff telephones HHA, Nursing Homes / Clarify orders / etc []  - 0 Routine Transfer to another Facility (non-emergent condition) []  - 0 Routine Hospital Admission (non-emergent condition) []  - 0 New Admissions / Biomedical engineer / Ordering NPWT Apligraf, etc. , []  - 0 Emergency Hospital Admission (emergent condition) X- 1 10 Simple Discharge Coordination []  - 0 Complex (extensive) Discharge  Coordination PROCESS - Special Needs []  - 0 Pediatric / Minor Patient Management []  - 0 Isolation Patient Management []  - 0 Hearing / Language / Visual special needs []  - 0 Assessment of Community assistance (transportation,  D/C planning, etc.) []  - 0 Additional assistance / Altered mentation []  - 0 Support Surface(s) Assessment (bed, cushion, seat, etc.) INTERVENTIONS - Wound Cleansing / Measurement X - Simple Wound Cleansing - one wound 1 5 []  - 0 Complex Wound Cleansing - multiple wounds X- 1 5 Wound Imaging (photographs - any number of wounds) []  - 0 Wound Tracing (instead of photographs) X- 1 5 Simple Wound Measurement - one wound []  - 0 Complex Wound Measurement - multiple wounds INTERVENTIONS - Wound Dressings X - Small Wound Dressing one or multiple wounds 1 10 []  - 0 Medium Wound Dressing one or multiple wounds []  - 0 Large Wound Dressing one or multiple wounds X- 1 5 Application of Medications - topical []  - 0 Application of Medications - injection INTERVENTIONS - Miscellaneous []  - 0 External ear exam []  - 0 Specimen Collection (cultures, biopsies, blood, body fluids, etc.) []  - 0 Specimen(s) / Culture(s) sent or taken to Lab for analysis []  - 0 Patient Transfer (multiple staff / [name] Lift / Similar devices) []  - 0 Simple Staple / Suture removal (25 or less) [name], [name] [id number] Page 3 of 12 []  - 0 Complex Staple / Suture removal (26 or more) []  - 0 Hypo / Hyperglycemic Management (close monitor of Blood Glucose) []  - 0 Ankle / Brachial Index (ABI) - do not check if billed separately X- 1 5 Vital Signs Has the patient been seen at the hospital within the last three years: Yes Total Score: 90 Level Of Care: New/Established - Level 3 Electronic Signature(s) Signed: 09/03/2022 4:44:37 PM By: Darren Hammock RN Entered By: [name] on 09/03/2022  08:55:42 -------------------------------------------------------------------------------- Encounter Discharge Information Details Patient Name: Date of Service: [name], [name]. 09/03/2022 8:15 [name]: [id number] Patient Account Number: [id number] Date of Birth/Sex: Treating RN: 03-May-1951 (63 y.Darren. [name] Primary Care Rochell Puett: [name] Other Clinician: Referring Jontavia Leatherbury: Treating Breeona Waid/Extender: [name], [name] in Treatment: 7 Encounter Discharge Information Items Post Procedure Vitals Discharge Condition: Stable Temperature (F): 98.1 Ambulatory Status: Ambulatory Pulse (bpm): 74 Discharge Destination: Home Respiratory Rate (breaths/min): 17 Transportation: Private Auto Blood Pressure (mmHg): 136/77 Accompanied By: daughter Schedule Follow-up Appointment: Yes Clinical Summary of Care: Patient Declined Electronic Signature(s) Signed: 09/03/2022 4:44:37 PM By: Darren Hammock RN Entered By: [name] on 09/03/2022 08:56:21 -------------------------------------------------------------------------------- Lower Extremity Assessment Details Patient Name: Date of Service: [name], [name]. 09/03/2022 8:15 [name]: [id number] Patient Account Number: [id number] Date of Birth/Sex: Treating RN: 11-24-1950 (75 y.Darren. [name] Primary Care Vinaya Sancho: [name] Other Clinician: Referring Jaydence Arnesen: Treating Kendyl Bissonnette/Extender: [name], [name] in Treatment: 7 Edema Assessment Assessed: [Left: Yes] [Right: Yes] Edema: [Left: Yes] [Right: No] Calf Left: Right: Point of Measurement: 39 cm From Medial Instep 36.5 cm 32 cm Ankle Left: Right: Point of Measurement: 8 cm From Medial Instep 24 cm 22 cm [name], [name] [id number] Page 4 of 12 Vascular Assessment Pulses: Dorsalis Pedis Palpable: [Left:Yes]  [Right:Yes] Electronic Signature(s) Signed: 09/03/2022 5:00:15 PM By: Darren Pilling RN, BSN Entered By: [name] on 09/03/2022 08:37:06 -------------------------------------------------------------------------------- Multi Wound Chart Details Patient Name: Date of Service: [name] [facility] [name]. 09/03/2022 8:15 [name]: [id number] Patient Account Number: [id number] Date of Birth/Sex: Treating RN: 1950-12-08 (88 y.Darren. [name] Primary Care Lajean Boese: [name] Other Clinician: Referring Calleigh Lafontant: Treating Dedrick Heffner/Extender: [name], [name] in Treatment: 7  Vital Signs Height(in): 68 Pulse(bpm): 129 Weight(lbs): 185 Blood Pressure(mmHg): 157/86 Body Mass Index(BMI): 28.1 Temperature(F): 98.4 Respiratory Rate(breaths/min): 20 [1:Photos:] [N/[name]:N/[name]] Right Achilles Left, Plantar Foot N/[name] Wound Location: Surgical Injury Gradually Appeared N/[name] Wounding Event: Diabetic Wound/Ulcer of the Lower Diabetic Wound/Ulcer of the Lower N/[name] Primary Etiology: Extremity Extremity Sleep Apnea, Arrhythmia, Congestive Sleep Apnea, Arrhythmia, Congestive N/[name] Comorbid History: Heart Failure, Hypertension, Peripheral Heart Failure, Hypertension, Peripheral Arterial Disease, Type II Diabetes Arterial Disease, Type II Diabetes 07/16/2022 03/09/2022 N/[name] Date Acquired: 7 7 N/[name] [name] of Treatment: Open Open N/[name] Wound Status: No No N/[name] Wound Recurrence: 5.9x1.9x0.3 0.4x0.5x0.4 N/[name] Measurements L x W x D (cm) 8.804 0.157 N/[name] [name] (cm) : rea 2.641 0.063 N/[name] Volume (cm) : 56.90% 28.60% N/[name] % Reduction in [name] rea: 74.10% 4.50% N/[name] % Reduction in Volume: 3 Starting Position 1 (Darren'clock): 9 Ending Position 1 (Darren'clock): 0.4 Maximum Distance 1 (cm): No Yes N/[name] Undermining: Grade 2 Grade 2 N/[name] Classification: Medium Medium N/[name] Exudate [name] mount: Serosanguineous Serosanguineous N/[name] Exudate Type: red, brown red, brown N/[name] Exudate  Color: Thickened Thickened N/[name] Wound Margin: Large (67-100%) Large (67-100%) N/[name] Granulation [name] mount: Red, Pink Pink, Pale N/[name] Granulation Quality: Small (1-33%) None Present (0%) N/[name] Necrotic [name] mount: Fat Layer (Subcutaneous Tissue): Yes Fat Layer (Subcutaneous Tissue): Yes N/[name] Exposed Structures: Tendon: Yes Fascia: No Fascia: No Tendon: No [name], [name] [id number] Page 5 of 12 Muscle: No Muscle: No Joint: No Joint: No Bone: No Bone: No Medium (34-66%) None N/[name] Epithelialization: Debridement - Excisional N/[name] N/[name] Debridement: Pre-procedure Verification/Time Out 08:53 N/[name] N/[name] Taken: Lidocaine N/[name] N/[name] Pain Control: Subcutaneous, Slough N/[name] N/[name] Tissue Debrided: Skin/Subcutaneous Tissue N/[name] N/[name] Level: 11.21 N/[name] N/[name] Debridement [name] (sq cm): rea Curette N/[name] N/[name] Instrument: Minimum N/[name] N/[name] Bleeding: Pressure N/[name] N/[name] Hemostasis [name] chieved: 0 N/[name] N/[name] Procedural Pain: 0 N/[name] N/[name] Post Procedural Pain: Procedure was tolerated well N/[name] N/[name] Debridement Treatment Response: 5.9x1.9x0.3 N/[name] N/[name] Post Debridement Measurements L x W x D (cm) 2.641 N/[name] N/[name] Post Debridement Volume: (cm) Callus: Yes Callus: Yes N/[name] Periwound Skin Texture: Excoriation: No Excoriation: No Induration: No Induration: No Crepitus: No Crepitus: No Rash: No Rash: No Scarring: No Scarring: No Dry/Scaly: Yes Maceration: No N/[name] Periwound Skin Moisture: Maceration: No Dry/Scaly: No Atrophie Blanche: No Atrophie Blanche: No N/[name] Periwound Skin Color: Cyanosis: No Cyanosis: No Ecchymosis: No Ecchymosis: No Erythema: No Erythema: No Hemosiderin Staining: No Hemosiderin Staining: No Mottled: No Mottled: No Pallor: No Pallor: No Rubor: No Rubor: No No Abnormality No Abnormality N/[name] Temperature: Debridement N/[name] N/[name] Procedures Performed: Treatment Notes Wound #1 (Achilles) Wound Laterality: Right Cleanser Dakins Solution Discharge  Instruction: Clean with Dakins Peri-Wound Care Sween Lotion (Moisturizing lotion) Discharge Instruction: Apply moisturizing lotion as directed Topical Primary Dressing Hydrofera Blue Classic Foam, 4x4 in Discharge Instruction: Moisten with saline prior to applying to wound bed Santyl Ointment Discharge Instruction: Apply nickel thick amount to wound bed as instructed Secondary Dressing ABD Pad, 5x9 Discharge Instruction: Apply over primary dressing as directed. Woven Gauze Sponge, Non-Sterile 4x4 in Discharge Instruction: Apply over primary dressing as directed. Secured With Compression Wrap Kerlix Roll 4.5x3.1 (in/yd) Discharge Instruction: Apply Kerlix and Coban compression as directed. Coban Self-Adherent Wrap 4x5 (in/yd) Discharge Instruction: Apply over Kerlix as directed. Compression Stockings Add-Ons Wound #2 (Foot) Wound Laterality: Plantar, Left AZEEM, SABIC [name] [id number] Page 6 of 12 Cleanser Dakins Solution Peri-Wound Care Skin Prep Discharge Instruction: Use skin prep as directed Topical Primary Dressing KerraCel Ag Gelling Fiber  Dressing, 4x5 in (silver alginate) Discharge Instruction: Apply silver alginate to wound bed as instructed Secondary Dressing Optifoam Non-Adhesive Dressing, 4x4 in Discharge Instruction: Apply over as [name] foam donut. Woven Gauze Sponges 2x2 in Discharge Instruction: Apply over primary dressing as directed. Secured With [employer], 4.5x3.1 (in/yd) Discharge Instruction: Secure with Kerlix as directed. 46M Medipore H Soft Cloth Surgical T ape, 4 x 10 (in/yd) Discharge Instruction: Secure with tape as directed. Compression Wrap Compression Stockings Add-Ons Electronic Signature(s) Signed: 09/03/2022 10:11:52 AM By: Darren Shan DO Signed: 09/03/2022 5:00:15 PM By: Darren Pilling RN, BSN Entered By: Darren Shan on 09/03/2022  09:06:07 -------------------------------------------------------------------------------- Multi-Disciplinary Care Plan Details Patient Name: Date of Service: [name]. 09/03/2022 8:15 [name]: [id number] Patient Account Number: [id number] Date of Birth/Sex: Treating RN: May 11, 1951 (67 y.Darren. [name] Primary Care Nyajah Hyson: [name] Other Clinician: Referring Alise Calais: Treating Nurah Petrides/Extender: [name], [name] in Treatment: 7 Active Inactive Nutrition Nursing Diagnoses: Potential for alteratiion in Nutrition/Potential for imbalanced nutrition Goals: Patient/caregiver agrees to and verbalizes understanding of need to use nutritional supplements and/or vitamins as prescribed Date Initiated: 07/16/2022 Target Resolution Date: 09/17/2022 Goal Status: Active Patient/caregiver will maintain therapeutic glucose control Date Initiated: 07/16/2022 Target Resolution Date: 09/17/2022 Goal Status: Active Interventions: Assess HgA1c results as ordered upon admission and as needed Provide education on nutrition Treatment Activities: [name], [name] ([id number]) [id number] Page 7 of 12 Education provided on Nutrition : 08/27/2022 Obtain HgA1c : 07/16/2022 Patient referred to Primary Care Physician for further nutritional evaluation : 07/16/2022 Notes: Pain, Acute or Chronic Nursing Diagnoses: Pain, acute or chronic: actual or potential Potential alteration in comfort, pain Goals: Patient will verbalize adequate pain control and receive pain control interventions during procedures as needed Date Initiated: 07/16/2022 Target Resolution Date: 09/17/2022 Goal Status: Active Interventions: Encourage patient to take pain medications as prescribed Provide education on pain management Reposition patient for comfort Treatment Activities: Administer pain control measures as ordered :  07/16/2022 Notes: Electronic Signature(s) Signed: 09/03/2022 4:44:37 PM By: Darren Hammock RN Entered By: [name] on 09/03/2022 08:54:57 -------------------------------------------------------------------------------- Pain Assessment Details Patient Name: Date of Service: [name] [name] 09/03/2022 8:15 [name]: [id number] Patient Account Number: [id number] Date of Birth/Sex: Treating RN: 1951/06/17 (85 y.Darren. [name] Primary Care Preslea Rhodus: [name] Other Clinician: Referring Rosemarie Galvis: Treating Nayson Traweek/Extender: [name], [name] in Treatment: 7 Active Problems Location of Pain Severity and Description of Pain Patient Has Paino No Site Locations Rate the pain. Current Pain Level: 0 Pain Management and Medication Current Pain Management: Medication: No Cold Application: No Rest: No Massage: No JERL, NOWELS [name] ([id number]) [id number] Page 8 of 12 Activity: No T.E.N.S.: No Heat Application: No Leg drop or elevation: No Is the Current Pain Management Adequate: Adequate How does your wound impact your activities of daily livingo Sleep: No Bathing: No Appetite: No Relationship With Others: No Bladder Continence: No Emotions: No Bowel Continence: No Work: No Toileting: No Drive: No Dressing: No Hobbies: No Engineer, maintenance) Signed: 09/03/2022 5:00:15 PM By: Darren Pilling RN, BSN Entered By: [name] on 09/03/2022 08:34:51 -------------------------------------------------------------------------------- Patient/Caregiver Education Details Patient Name: Date of Service: [name]. 10/26/2023andnbsp8:15 [name]: [id number] Patient Account Number: [id number] Date of Birth/Gender: Treating RN: [birth year] (18 y.Darren. [name] Primary Care Physician: [name] Other Clinician: Referring Physician: Treating  Physician/Extender: [name], [name] in Treatment: 7 Education  Assessment Education Provided To: Patient Education Topics Provided Nutrition: Methods: Explain/Verbal Responses: Reinforcements needed, State content correctly Electronic Signature(s) Signed: 09/03/2022 4:44:37 PM By: Darren Hammock RN Entered By: [name] on 09/03/2022 08:55:10 -------------------------------------------------------------------------------- Wound Assessment Details Patient Name: Date of Service: [name] [facility] [name]. 09/03/2022 8:15 [name]: [id number] Patient Account Number: [id number] Date of Birth/Sex: Treating RN: September 10, 1951 (43 y.Darren. [name] Primary Care Neya Creegan: [name] Other Clinician: Referring Aarian Griffie: Treating Kamaury Cutbirth/Extender: [name], [name] in Treatment: 7 Wound Status Wound Number: 1 Primary Diabetic Wound/Ulcer of the Lower Extremity Etiology: Wound Location: Right Achilles Wound Open Wounding Event: Surgical Injury Status: Date Acquired: 07/16/2022 Comorbid Sleep Apnea, Arrhythmia, Congestive Heart Failure, Hypertension, [name] Of Treatment: 7 History: Peripheral Arterial Disease, Type II Diabetes Clustered Wound: No [name], [name] ([id number]) [id number] Page 9 of 12 Photos Wound Measurements Length: (cm) 5.9 Width: (cm) 1.9 Depth: (cm) 0.3 Area: (cm) 8.804 Volume: (cm) 2.641 % Reduction in Area: 56.9% % Reduction in Volume: 74.1% Epithelialization: Medium (34-66%) Tunneling: No Undermining: No Wound Description Classification: Grade 2 Wound Margin: Thickened Exudate Amount: Medium Exudate Type: Serosanguineous Exudate Color: red, brown Foul Odor After Cleansing: No Slough/Fibrino Yes Wound Bed Granulation Amount: Large (67-100%) Exposed Structure Granulation Quality: Red, Pink Fascia Exposed: No Necrotic Amount: Small (1-33%) Fat Layer  (Subcutaneous Tissue) Exposed: Yes Necrotic Quality: Adherent Slough Tendon Exposed: Yes Muscle Exposed: No Joint Exposed: No Bone Exposed: No Periwound Skin Texture Texture Color No Abnormalities Noted: No No Abnormalities Noted: No Callus: Yes Atrophie Blanche: No Crepitus: No Cyanosis: No Excoriation: No Ecchymosis: No Induration: No Erythema: No Rash: No Hemosiderin Staining: No Scarring: No Mottled: No Pallor: No Moisture Rubor: No No Abnormalities Noted: Yes Temperature / Pain Temperature: No Abnormality Treatment Notes Wound #1 (Achilles) Wound Laterality: Right Cleanser Dakins Solution Discharge Instruction: Clean with Dakins Peri-Wound Care Sween Lotion (Moisturizing lotion) Discharge Instruction: Apply moisturizing lotion as directed Topical Primary Dressing Hydrofera Blue Classic Foam, 4x4 in Discharge Instruction: Moisten with saline prior to applying to wound bed Santyl Ointment Discharge Instruction: Apply nickel thick amount to wound bed as instructed Secondary Dressing [name], [name] ([id number]) [id number] Page 10 of 12 ABD Pad, 5x9 Discharge Instruction: Apply over primary dressing as directed. Woven Gauze Sponge, Non-Sterile 4x4 in Discharge Instruction: Apply over primary dressing as directed. Secured With Compression Wrap Kerlix Roll 4.5x3.1 (in/yd) Discharge Instruction: Apply Kerlix and Coban compression as directed. Coban Self-Adherent Wrap 4x5 (in/yd) Discharge Instruction: Apply over Kerlix as directed. Compression Stockings Add-Ons Electronic Signature(s) Signed: 09/03/2022 4:24:03 PM By: Adline Peals Signed: 09/03/2022 5:00:15 PM By: Darren Pilling RN, BSN Entered By: Adline Peals on 09/03/2022 08:40:48 -------------------------------------------------------------------------------- Wound Assessment Details Patient Name: Date of Service: Darren Fantasia [name] [facility] [name]. 09/03/2022 8:15 [name]  M Medical Record Number: [id number] Patient Account Number: [id number] Date of Birth/Sex: Treating RN: 1951/09/05 (46 y.Darren. [name] Primary Care Sharon Rubis: [name] Other Clinician: Referring Shakaria Raphael: Treating Ebony Yorio/Extender: [name], [name] in Treatment: 7 Wound Status Wound Number: 2 Primary Diabetic Wound/Ulcer of the Lower Extremity Etiology: Wound Location: Left, Plantar Foot Wound Open Wounding Event: Gradually Appeared Status: Date Acquired: 03/09/2022 Comorbid Sleep Apnea, Arrhythmia, Congestive Heart Failure, Hypertension, [name] Of Treatment: 7 History: Peripheral Arterial Disease, Type II Diabetes Clustered Wound: No Photos Wound Measurements Length: (cm) 0.4 Width: (cm) 0.5 Depth: (cm) 0.4 Area: (cm) 0.157 Volume: (cm) 0.063 % Reduction in Area: 28.6% % Reduction in Volume: 4.5% Epithelialization: None Tunneling: No  Undermining: Yes Starting Position (Darren'clock): 3 Ending Position (Darren'clock): 9 Maximum Distance: (cm) 0.4 Wound Description Classification: Grade 2 [name], [name] [name] Wound Margin: Thickened Exudate Amount: Medium Exudate Type: Serosanguineous Exudate Color: red, brown Foul Odor After Cleansing: No [id number] Page 11 of 12 Slough/Fibrino No Wound Bed Granulation Amount: Large (67-100%) Exposed Structure Granulation Quality: Pink, Pale Fascia Exposed: No Necrotic Amount: None Present (0%) Fat Layer (Subcutaneous Tissue) Exposed: Yes Tendon Exposed: No Muscle Exposed: No Joint Exposed: No Bone Exposed: No Periwound Skin Texture Texture Color No Abnormalities Noted: No No Abnormalities Noted: No Callus: Yes Atrophie Blanche: No Crepitus: No Cyanosis: No Excoriation: No Ecchymosis: No Induration: No Erythema: No Rash: No Hemosiderin Staining: No Scarring: No Mottled: No Pallor: No Moisture Rubor: No No Abnormalities Noted: No Dry / Scaly: No  Temperature / Pain Maceration: No Temperature: No Abnormality Treatment Notes Wound #2 (Foot) Wound Laterality: Plantar, Left Cleanser Dakins Solution Peri-Wound Care Skin Prep Discharge Instruction: Use skin prep as directed Topical Primary Dressing KerraCel Ag Gelling Fiber Dressing, 4x5 in (silver alginate) Discharge Instruction: Apply silver alginate to wound bed as instructed Secondary Dressing Optifoam Non-Adhesive Dressing, 4x4 in Discharge Instruction: Apply over as [name] foam donut. Woven Gauze Sponges 2x2 in Discharge Instruction: Apply over primary dressing as directed. Secured With [employer], 4.5x3.1 (in/yd) Discharge Instruction: Secure with Kerlix as directed. 48M Medipore H Soft Cloth Surgical T ape, 4 x 10 (in/yd) Discharge Instruction: Secure with tape as directed. Compression Wrap Compression Stockings Add-Ons Electronic Signature(s) Signed: 09/03/2022 4:24:03 PM By: Adline Peals Signed: 09/03/2022 5:00:15 PM By: Darren Pilling RN, BSN Entered By: Adline Peals on 09/03/2022 08:41:13 Cordie Grice ([id number]) [id number] Page 12 of 12 -------------------------------------------------------------------------------- Vitals Details Patient Name: Date of Service: Darren DRO [name] [facility] [name]. 09/03/2022 8:15 [name]: [id number] Patient Account Number: [id number] Date of Birth/Sex: Treating RN: May 22, 1951 (53 y.Darren. [name] Primary Care Jaquelynn Wanamaker: [name] Other Clinician: Referring Leigh Blas: Treating Lani Havlik/Extender: [name], [name] in Treatment: 7 Vital Signs Time Taken: 08:29 Temperature (F): 98.4 Height (in): 68 Pulse (bpm): 129 Weight (lbs): 185 Respiratory Rate (breaths/min): 20 Body Mass Index (BMI): 28.1 Blood Pressure (mmHg): 157/86 Reference Range: 80 - 120 mg / dl Electronic Signature(s) Signed: 09/03/2022 5:00:15 PM By: Darren Pilling RN,  BSN Entered By: [name] on 09/03/2022 08:34:42

## 2022-09-03 NOTE — Progress Notes (Signed)
Darren, PITNER [name] ([id number]) [id number] Page 1 of 9 Visit Report for 09/03/2022 Chief Complaint Document Details Patient Name: Date of Service: [name] [facility] [name]. 09/03/2022 8:15 [name]: [id number] Patient Account Number: [id number] Date of Birth/Sex: Treating RN: 06/18/1951 (71 y.Darren. [name] Primary Care Provider: [name] Other Clinician: Referring Provider: Treating Provider/Extender: [name], [name] in Treatment: 7 Information Obtained from: Patient Chief Complaint 07/16/2022; bilateral lower extremity wounds Electronic Signature(s) Signed: 09/03/2022 10:11:52 AM By: [name] DO Entered By: [name] on 09/03/2022 09:06:14 -------------------------------------------------------------------------------- Debridement Details Patient Name: Date of Service: Darren, [place] [name]. 09/03/2022 8:15 [name]: [id number] Patient Account Number: [id number] Date of Birth/Sex: Treating RN: [birth year] (50 y.Darren. Burnadette Pop, Lauren Primary Care Provider: [name] Other Clinician: Referring Provider: Treating Provider/Extender: [name] Ngetich, [name] in Treatment: 7 Debridement Performed for Assessment: Wound #1 Right Achilles Performed By: Physician [name], DO Debridement Type: Debridement Severity of Tissue Pre Debridement: Fat layer exposed Level of Consciousness (Pre-procedure): Awake and Alert Pre-procedure Verification/Time Out Yes - 08:53 Taken: Start Time: 08:53 Pain Control: Lidocaine T Area Debrided (L x W): otal 5.9 (cm) x 1.9 (cm) = 11.21 (cm) Tissue and other material debrided: Viable, Non-Viable, Slough, Subcutaneous, Slough Level: Skin/Subcutaneous Tissue Debridement Description: Excisional Instrument: Curette Bleeding: Minimum Hemostasis Achieved: Pressure End Time: 08:53 Procedural Pain: 0 Post Procedural Pain:  0 Response to Treatment: Procedure was tolerated well Level of Consciousness (Post- Awake and Alert procedure): Post Debridement Measurements of Total Wound Length: (cm) 5.9 Width: (cm) 1.9 Depth: (cm) 0.3 Volume: (cm) 2.641 Character of Wound/Ulcer Post Debridement: Improved Severity of Tissue Post Debridement: Fat layer exposed KO, BARDON [name] ([id number]) [id number] Page 2 of 9 Post Procedure Diagnosis Same as Pre-procedure Electronic Signature(s) Signed: 09/03/2022 10:11:52 AM By: [name] DO Signed: 09/03/2022 4:44:37 PM By: Rhae Hammock RN Entered By: Rhae Hammock on 09/03/2022 08:54:02 -------------------------------------------------------------------------------- HPI Details Patient Name: Date of Service: [name]. 09/03/2022 8:15 [name]: [id number] Patient Account Number: [id number] Date of Birth/Sex: Treating RN: 1951/10/15 (42 y.Darren. 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 He is not sure how the wounds started. He has been following with podiatry for this issue. He had removal of the tendon to the right lower extremity by Dr. SBlenda Mounts He has been using Betadine to the wound beds. On 06/11/2022 he had [name] right posterior tibial artery angioplasty by Dr. CCarlis Abbott It was reported the patient is  optimized after revascularization of the right lower extremity. His ABIs on the left were 0.91. He currently denies systemic signs of infection. He also does not wear shoes. He came in with Kerlix wrap to his feet bilaterally. This did not completely cover his feet. 07/23/2022: This is [name] patient of Dr. HJodene Namthat she asked me to take [name] look at last week due to the significant involvement of the muscle and tendon on his  right posterior leg wound. He needed an aggressive debridement and asked me if I would be able to perform this on her behalf. The patient is here today for that procedure. When his dressing was removed in clinic today, the wound was teeming with maggots. There is necrotic muscle, tendon, and fat, with [name] thick layer of slough on the wound. 9/21; patient presents for follow-up. He was debrided by Dr. Celine Ahr at last clinic visit without any issues. He has been placing Dakin's wet-to-dry dressings to the right posterior wound. He has been using Medihoney to the left foot wound. He reports improvement in wound healing. He has no issues or complaints today. 9/28; patient with type 2 diabetes PAD status post revascularization. He underwent retrograde right PTA angioplasty. Last saw Dr. Carlis Abbott on 9/12 and was felt to have [name] brisk posterior tibial signal at the right ankle. He had [name] pulsatile toe tracing that appeared adequate for healing. He has been using Santyl and Hydrofera Blue on the right Achilles area. He has [name] smaller area on the left fifth plantar metatarsal head They were apparently in the ER on 9/23 with swelling and discoloration above the wrap. They have [name] picture of the leg after the wrap was taken off which looks like that it was excessively tight superiorly 10/6; patient presents for follow-up. We have been using Santyl and Hydrofera Blue to the right lower extremity wound under Kerlix/Coban. T the left foot we Darren have been using silver alginate with Medihoney. He again  comes in with no shoes. He has no issues or complaints today. 10/12; patient presents for follow-up. We have been using Santyl and Hydrofera Blue to the right lower extremity under Kerlix/Coban And silver alginate to the left foot wound. He has no issues today. He denies signs of infection. 10/19; patient presents for follow-up. We continue to use Santyl and Hydrofera Blue to the right lower extremity under Kerlix/Coban and silver alginate to the left foot wound. There is been improvement in wound healing. 10/26; patient presents for follow-up. We have been using Santyl and Hydrofera Blue to the right lower extremity under Kerlix/Coban and silver alginate to the left foot. There continues to be improvement in wound healing. Patient has no issues or complaints today. Electronic Signature(s) Signed: 09/03/2022 10:11:52 AM By: [name] DO Entered By: [name] on 09/03/2022 09:06:41 Physical Exam Details -------------------------------------------------------------------------------- [name] ([id number]) [id number] Page 3 of 9 Patient Name: Date of Service: [name] [facility] [name]. 09/03/2022 8:15 [name]: [id number] Patient Account Number: [id number] Date of Birth/Sex: Treating RN: September 07, 1951 (31 y.Darren. [name] Primary Care Provider: [name] Other Clinician: Referring Provider: Treating Provider/Extender: [name] Ngetich, [name] in Treatment: 7 Constitutional respirations regular, non-labored and within target range for patient.. Cardiovascular 2+ dorsalis pedis/posterior tibialis pulses. Psychiatric pleasant and cooperative. Notes Right lower extremity: T the posterior aspect there is An open wound with granulation tissue and slough. Left lower extremity: T the plantar aspect of the fifth Darren Darren met head there is an open wound with granulation tissue. No signs of surrounding infection T any of  the wound beds. Venous stasis dermatitis and Darren lymphedema skin changes bilaterally Electronic Signature(s) Signed: 09/03/2022 10:11:52 AM By: [name] DO Entered By: [name] on 09/03/2022 09:07:33 -------------------------------------------------------------------------------- Physician Orders Details Patient Name: Date of Service: [place], [place] [name]. 09/03/2022 8:15 [name]: [id number] Patient Account Number: [id number] Date of Birth/Sex: Treating RN:  1951-06-16 (60 y.Darren. [name] Primary Care Provider: [name] Other Clinician: Referring Provider: Treating Provider/Extender: [name] Ngetich, [name] in Treatment: 7 Verbal / Phone Orders: No Diagnosis Coding Follow-up Appointments ppointment in 1 week. - Dr. Heber [place] Return [name] ppointment in 2 weeks. - Dr. Heber [place] Return [name] Other: - [facility]- will ship wound care supplies. Anesthetic (In clinic) Topical Lidocaine 5% applied to wound bed Cellular or Tissue Based Products Cellular or Tissue Based Product Type: - run insurance for [employer] and theraskin to see if approved. Bathing/ Shower/ Hygiene May shower with protection but do not get wound dressing(s) wet. Edema Control - Lymphedema / SCD / Other Elevate legs to the level of the heart or above for 30 minutes daily and/or when sitting, [name] frequency of: - 3-4 times [name] day throughout the day. Avoid standing for long periods of time. Off-Loading Open toe surgical shoe to: - to both feet Other: - minimize walking and standing to aid in offloading pressure to wound. Wound Treatment Wound #1 - Achilles Wound Laterality: Right Cleanser: Dakins Solution 1 x Per Week/30 Days Discharge Instructions: Clean with Dakins Peri-Wound Care: Sween Lotion (Moisturizing lotion) 1 x Per Week/30 Days Discharge Instructions: Apply moisturizing lotion as directed Prim Dressing: Hydrofera Blue Classic Foam, 4x4 in ary 1 x Per Week/30  Days MELVILLE, ENGEN [name] ([id number]) [id number] Page 4 of 9 Discharge Instructions: Moisten with saline prior to applying to wound bed Prim Dressing: Santyl Ointment 1 x Per Week/30 Days ary Discharge Instructions: Apply nickel thick amount to wound bed as instructed Secondary Dressing: ABD Pad, 5x9 1 x Per Week/30 Days Discharge Instructions: Apply over primary dressing as directed. Secondary Dressing: Woven Gauze Sponge, Non-Sterile 4x4 in 1 x Per Week/30 Days Discharge Instructions: Apply over primary dressing as directed. Compression Wrap: Kerlix Roll 4.5x3.1 (in/yd) 1 x Per Week/30 Days Discharge Instructions: Apply Kerlix and Coban compression as directed. Compression Wrap: Coban Self-Adherent Wrap 4x5 (in/yd) 1 x Per Week/30 Days Discharge Instructions: Apply over Kerlix as directed. Wound #2 - Foot Wound Laterality: Plantar, Left Cleanser: Dakins Solution 1 x Per Day/30 Days Peri-Wound Care: Skin Prep 1 x Per Day/30 Days Discharge Instructions: Use skin prep as directed Prim Dressing: KerraCel Ag Gelling Fiber Dressing, 4x5 in (silver alginate) 1 x Per Day/30 Days ary Discharge Instructions: Apply silver alginate to wound bed as instructed Secondary Dressing: Optifoam Non-Adhesive Dressing, 4x4 in 1 x Per Day/30 Days Discharge Instructions: Apply over as [name] foam donut. Secondary Dressing: Woven Gauze Sponges 2x2 in 1 x Per Day/30 Days Discharge Instructions: Apply over primary dressing as directed. Secured With: [employer], 4.5x3.1 (in/yd) 1 x Per Day/30 Days Discharge Instructions: Secure with Kerlix as directed. Secured With: 4M Medipore H Soft Cloth Surgical T ape, 4 x 10 (in/yd) 1 x Per Day/30 Days Discharge Instructions: Secure with tape as directed. Electronic Signature(s) Signed: 09/03/2022 10:11:52 AM By: [name] DO Entered By: [name] on 09/03/2022  09:07:41 -------------------------------------------------------------------------------- Problem List Details Patient Name: Date of Service: [name] [name] [facility] [name]. 09/03/2022 8:15 [name]: [id number] Patient Account Number: [id number] Date of Birth/Sex: Treating RN: [birth year] (64 y.Darren. [name] Primary Care Provider: [name] Other Clinician: Referring Provider: Treating Provider/Extender: [name], [name] in Treatment: 7 Active Problems ICD-10 Encounter Code Description Active Date MDM Diagnosis L97.518 Non-pressure chronic ulcer of other part of right foot with other specified 07/16/2022 No Yes severity L97.522 Non-pressure chronic ulcer of other part of  left foot with fat layer exposed 07/16/2022 No Yes [name], [name] ([id number]) [id number] Page 5 of 9 E11.621 Type 2 diabetes mellitus with foot ulcer 07/16/2022 No Yes I73.9 Peripheral vascular disease, unspecified 07/16/2022 No Yes 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 07/23/2022 No Yes muscle Inactive Problems Resolved Problems Electronic Signature(s) Signed: 09/03/2022 10:11:52 AM By: [name] DO Entered By: [name] on 09/03/2022 09:05:53 -------------------------------------------------------------------------------- Progress Note Details Patient Name: Date of Service: [name]. 09/03/2022 8:15 [name]: [id number] Patient Account Number: [id number] Date of Birth/Sex: Treating RN: 05-Nov-1951 (15 y.Darren. [name] Primary Care Provider: [name] Other Clinician: Referring Provider: Treating Provider/Extender: [name], [name] in Treatment: 7 Subjective Chief Complaint Information obtained from Patient 07/16/2022; bilateral lower extremity wounds History of Present Illness (HPI) Admission 07/16/2022 Mr. 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 He is not sure how the wounds started. He has been following with podiatry for this issue. He had removal of the tendon to the right lower extremity by Dr. SBlenda Mounts He has been using Betadine to the wound beds. On 06/11/2022 he had [name] right posterior tibial artery angioplasty by Dr. CCarlis Abbott It was reported the patient is optimized after revascularization of the right lower extremity. His ABIs on the left were 0.91. He currently denies systemic signs of infection. He also does not wear shoes. He came in with Kerlix wrap to his feet bilaterally. This did not completely cover his feet. 07/23/2022: This is [name] patient of Dr. HJodene Namthat she asked me to take [name] look at last week due to the significant involvement of the muscle and tendon on his right posterior leg wound. He needed an aggressive debridement and asked me if I would be able to perform this on her behalf. The patient is here today for that procedure. When his dressing was removed in clinic today, the wound was teeming with maggots. There is necrotic muscle, tendon, and fat, with [name] thick layer of slough on the wound. 9/21; patient presents for follow-up. He was debrided by Dr. CCeline Ahrat last clinic visit without any issues. He has been placing Dakin's wet-to-dry dressings to the right posterior wound. He has been using Medihoney to the left foot wound. He reports improvement in wound healing. He has no issues or complaints today. 9/28; patient with type 2 diabetes PAD status post revascularization. He underwent retrograde right PTA angioplasty. Last saw Dr.  CCarlis Abbotton 9/12 and was felt to have [name] brisk posterior tibial signal at the right ankle. He had [name] pulsatile toe tracing that appeared adequate for healing. He has been using Santyl and Hydrofera Blue on the right Achilles area. oo He has [name] smaller area on the left fifth plantar metatarsal head They were apparently in the ER on 9/23 with swelling and discoloration above the wrap. They have [name] picture of the leg after the wrap was taken off which looks like that it was excessively tight superiorly 10/6; patient presents for follow-up. We have been using Santyl and Hydrofera Blue to the right lower extremity wound under Kerlix/Coban. T  the left foot we Darren have been using silver alginate with Medihoney. He again comes in with no shoes. He has no issues or complaints today. 10/12; patient presents for follow-up. We have been using Santyl and Hydrofera Blue to the right lower extremity under Kerlix/Coban And silver alginate to the left foot wound. He has no issues today. He denies signs of infection. 10/19; patient presents for follow-up. We continue to use Santyl and Hydrofera Blue to the right lower extremity under Kerlix/Coban and silver alginate to the left foot wound. There is been improvement in wound healing. ROSBEL, BUCKNER [name] ([id number]) [id number] Page 6 of 9 10/26; patient presents for follow-up. We have been using Santyl and Hydrofera Blue to the right lower extremity under Kerlix/Coban and silver alginate to the left foot. There continues to be improvement in wound healing. Patient has no issues or complaints today. Patient History Information obtained from Chart. Family History Diabetes - Mother, Heart Disease - Mother,Siblings. Social History Current every day smoker, Alcohol Use - Never, Drug Use - No History, Caffeine Use - Never. Medical History Respiratory Patient has history of Sleep Apnea - does not wear Cardiovascular Patient has history of  Arrhythmia - [name]. Fib, Congestive Heart Failure - EF 25%, Hypertension, Peripheral Arterial Disease Denies history of Peripheral Venous Disease Endocrine Patient has history of Type II Diabetes Hospitalization/Surgery History - Angiogram 06/11/2022 Dr. Carlis Abbott [facility]. - cardioversion 11/06/2021, 2015, 2017. 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 Medical [name] Surgical History Notes nd Gastrointestinal Ascites Genitourinary stage III Kidney disease Objective Constitutional respirations regular, non-labored and within target range for patient.. Vitals Time Taken: 8:29 AM, Height: 68 in, Weight: 185 lbs, BMI: 28.1, Temperature: 98.4 F, Pulse: 129 bpm, Respiratory Rate: 20 breaths/min, Blood Pressure: 157/86 mmHg. Cardiovascular 2+ dorsalis pedis/posterior tibialis pulses. Psychiatric pleasant and cooperative. General Notes: Right lower extremity: T the posterior aspect there is An open wound with granulation tissue and slough. Left lower extremity: T the plantar Darren Darren aspect of the fifth met head there is an open wound with granulation tissue. No signs of surrounding infection T any of the wound beds. Venous stasis Darren dermatitis and lymphedema skin changes bilaterally Integumentary (Hair, Skin) Wound #1 status is Open. Original cause of wound was Surgical Injury. The date acquired was: 07/16/2022. The wound has been in treatment 7 weeks. The wound is located on the Right Achilles. The wound measures 5.9cm length x 1.9cm width x 0.3cm depth; 8.804cm^2 area and 2.641cm^3 volume. There is tendon and Fat Layer (Subcutaneous Tissue) exposed. There is no tunneling or undermining noted. There is [name] medium amount of serosanguineous drainage noted. The wound margin is thickened. There is large (67-100%) red, pink granulation within the wound bed. There is [name] small (1-33%) amount of necrotic tissue within the wound bed including Adherent Slough. The periwound skin appearance had no abnormalities noted for moisture.  The periwound skin appearance exhibited: Callus. The periwound skin appearance did not exhibit: Crepitus, Excoriation, Induration, Rash, Scarring, Atrophie Blanche, Cyanosis, Ecchymosis, Hemosiderin Staining, Mottled, Pallor, Rubor, Erythema. Periwound temperature was noted as No Abnormality. Wound #2 status is Open. Original cause of wound was Gradually Appeared. The date acquired was: 03/09/2022. The wound has been in treatment 7 weeks. The wound is located on the [place]. The wound measures 0.4cm length x 0.5cm width x 0.4cm depth; 0.157cm^2 area and 0.063cm^3 volume. There is Fat Layer (Subcutaneous Tissue) exposed. There is no tunneling noted, however, there is undermining starting at 3:00 and ending at 9:00 with  [name] maximum distance of 0.4cm. There is [name] medium amount of serosanguineous drainage noted. The wound margin is thickened. There is large (67-100%) pink, pale granulation within the wound bed. There is no necrotic tissue within the wound bed. The periwound skin appearance exhibited: Callus. The periwound skin appearance did not exhibit: Crepitus, Excoriation, Induration, Rash, Scarring, Dry/Scaly, Maceration, Atrophie Blanche, Cyanosis, Ecchymosis, Hemosiderin Staining, Mottled, Pallor, Rubor, Erythema. Periwound temperature was noted as No Abnormality. 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 I debrided nonviable tissue to the right lower extremity wound. I recommended continuing the course  with Santyl and Hydrofera Blue under Kerlix/Coban here. Continue silver alginate to the left foot wound and aggressive offloading. He has [name] surgical shoe with foam donut pad. Follow-up in 1 week Procedures Wound #1 Pre-procedure diagnosis of Wound #1 is [name] Diabetic Wound/Ulcer of the Lower Extremity located on the Right Achilles .Severity of Tissue Pre Debridement is: Fat layer exposed. There was [name] Excisional Skin/Subcutaneous Tissue Debridement with [name] total area of 11.21 sq cm performed by [name], DO. With the following instrument(s): Curette to remove Viable and Non-Viable tissue/material. Material removed includes Subcutaneous Tissue and Slough and after achieving pain control using Lidocaine. No specimens were taken. [name] time out was conducted at 08:53, prior to the start of the procedure. [name] Minimum amount of bleeding was controlled with Pressure. The procedure was tolerated well with [name] pain level of 0 throughout and [name] pain level of 0 following the procedure. Post Debridement Measurements: 5.9cm length x 1.9cm width x 0.3cm depth; 2.641cm^3 volume. Character of Wound/Ulcer Post Debridement is improved. Severity of Tissue Post Debridement is: Fat layer exposed. Post procedure Diagnosis Wound #1: Same as Pre-Procedure Plan Follow-up Appointments: Return Appointment in 1 week. - Dr. Heber [place] Return Appointment in 2 weeks. - Dr. Heber [place] Other: - [facility]- will ship wound care supplies. Anesthetic: (In clinic) Topical Lidocaine 5% applied to wound bed Cellular or Tissue Based Products: Cellular or Tissue Based Product Type: - run insurance for [employer] and [employer]raskin to see if approved. Bathing/ Shower/ Hygiene: May shower with protection but do not get wound dressing(s) wet. Edema Control - Lymphedema / SCD / Other: Elevate legs to the level of the heart or above for 30 minutes daily and/or when sitting, [name] frequency of: - 3-4 times [name] day throughout the day. Avoid standing for long periods of  time. Off-Loading: Open toe surgical shoe to: - to both feet Other: - minimize walking and standing to aid in offloading pressure to wound. WOUND #1: - Achilles Wound Laterality: Right Cleanser: Dakins Solution 1 x Per Week/30 Days Discharge Instructions: Clean with Dakins Peri-Wound Care: Sween Lotion (Moisturizing lotion) 1 x Per Week/30 Days Discharge Instructions: Apply moisturizing lotion as directed Prim Dressing: Hydrofera Blue Classic Foam, 4x4 in 1 x Per Week/30 Days ary Discharge Instructions: Moisten with saline prior to applying to wound bed Prim Dressing: Santyl Ointment 1 x Per Week/30 Days ary Discharge Instructions: Apply nickel thick amount to wound bed as instructed Secondary Dressing: ABD Pad, 5x9 1 x Per Week/30 Days Discharge Instructions: Apply over primary  dressing as directed. Secondary Dressing: Woven Gauze Sponge, Non-Sterile 4x4 in 1 x Per Week/30 Days Discharge Instructions: Apply over primary dressing as directed. Com pression Wrap: Kerlix Roll 4.5x3.1 (in/yd) 1 x Per Week/30 Days Discharge Instructions: Apply Kerlix and Coban compression as directed. Com pression Wrap: Coban Self-Adherent Wrap 4x5 (in/yd) 1 x Per Week/30 Days Discharge Instructions: Apply over Kerlix as directed. WOUND #2: - Foot Wound Laterality: Plantar, Left Cleanser: Dakins Solution 1 x Per Day/30 Days Peri-Wound Care: Skin Prep 1 x Per Day/30 Days Discharge Instructions: Use skin prep as directed Prim Dressing: KerraCel Ag Gelling Fiber Dressing, 4x5 in (silver alginate) 1 x Per Day/30 Days ary Discharge Instructions: Apply silver alginate to wound bed as instructed Secondary Dressing: Optifoam Non-Adhesive Dressing, 4x4 in 1 x Per Day/30 Days Discharge Instructions: Apply over as [name] foam donut. Secondary Dressing: Woven Gauze Sponges 2x2 in 1 x Per Day/30 Days Discharge Instructions: Apply over primary dressing as directed. Secured With: [employer], 4.5x3.1 (in/yd) 1 x Per  Day/30 Days Discharge Instructions: Secure with Kerlix as directed. Secured With: 40M Medipore H Soft Cloth Surgical T ape, 4 x 10 (in/yd) 1 x Per Day/30 Days Discharge Instructions: Secure with tape as directed. 1. In office sharp debridement 2. Santyl and Hydrofera Blue under Kerlix/Cobanooright lower extremity 3. Silver alginateooleft lower extremity 4. Aggressive offloadingoosurgical shoe with foam donut 5. Follow-up in 1 week Electronic Signature(s) Signed: 09/03/2022 10:11:52 AM By: Boris Sharper [phone] By: [name] DO [id number] Page 8 of 9 Signed: 09/03/2022 10:11:52 Entered By: [name] on 09/03/2022 09:10:11 -------------------------------------------------------------------------------- HxROS Details Patient Name: Date of Service: [name] [facility] [name]. 09/03/2022 8:15 [name]: [id number] Patient Account Number: [id number] Date of Birth/Sex: Treating RN: [birth year] (46 y.Darren. [name] Primary Care Provider: [name] Other Clinician: Referring Provider: Treating Provider/Extender: [name] Ngetich, [name] in Treatment: 7 Information Obtained From Chart Respiratory Medical History: Positive for: Sleep Apnea - does not wear Cardiovascular Medical History: Positive for: Arrhythmia - [name]. Fib; Congestive Heart Failure - EF 25%; Hypertension; Peripheral Arterial Disease Negative for: Peripheral Venous Disease Gastrointestinal Medical History: Past Medical History Notes: Ascites Endocrine Medical History: Positive for: Type II Diabetes Time with diabetes: 6 years Treated with: Insulin Blood sugar tested every day: No Genitourinary Medical History: Past Medical History Notes: stage III Kidney disease Immunizations Pneumococcal Vaccine: Received Pneumococcal Vaccination: No Implantable Devices No devices added Hospitalization / Surgery  History Type of Hospitalization/Surgery Angiogram 06/11/2022 Dr. 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 Family and Social History Diabetes: Yes - Mother; Heart Disease: Yes - Mother,Siblings; Current every day smoker; Alcohol Use: Never; Drug Use: No History; Caffeine Use: Never; Financial Concerns: No; Food, Clothing or Shelter Needs: No; Support System Lacking: No; Transportation Concerns: No Electronic Signature(s) Signed: 09/03/2022 10:11:52 AM By: [name] DO Signed: 09/03/2022 5:00:15 PM By: Deon Pilling RN, BSN Entered By: [name] on 09/03/2022 09:06:47 [name] ([id number]) [id number] Page 9 of 9 -------------------------------------------------------------------------------- SuperBill Details Patient Name: Date of Service: [name] [name]. 09/03/2022 Medical Record Number: [id number] Patient Account Number: [id number] Date of Birth/Sex: Treating RN: [birth year] (64 y.Darren. 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 Facility Procedures : CPT4 Code: [id number] Description: 20761 - WOUND CARE VISIT-LEV 3 EST PT Modifier: Quantity: 1 : CPT4 Code: [id number] Description: 42320 - DEB SUBQ TISSUE 20 SQ CM/< ICD-10 Diagnosis Description 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 Modifier:  cle Quantity: 1 Physician Procedures : CPT4 Code Description Modifier [id number] 11042 - WC PHYS SUBQ TISS 20 SQ CM ICD-10 Diagnosis Description F95.790 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

## 2022-09-07 ENCOUNTER — Ambulatory Visit: Payer: Medicare HMO | Attending: Cardiovascular Disease | Admitting: *Deleted

## 2022-09-07 DIAGNOSIS — Z5181 Encounter for therapeutic drug level monitoring: Secondary | ICD-10-CM | POA: Diagnosis not present

## 2022-09-07 DIAGNOSIS — I513 Intracardiac thrombosis, not elsewhere classified: Secondary | ICD-10-CM | POA: Diagnosis not present

## 2022-09-07 DIAGNOSIS — I483 Typical atrial flutter: Secondary | ICD-10-CM

## 2022-09-07 DIAGNOSIS — I4891 Unspecified atrial fibrillation: Secondary | ICD-10-CM

## 2022-09-07 LAB — POCT INR: INR: 1.8 — AB (ref 2.0–3.0)

## 2022-09-07 NOTE — Patient Instructions (Addendum)
Description   Take an extra tablet (1.5 tablets) today then start taking 1/2 tablet daily except for 1 tablet on Mondays, Wednesdays, and Fridays. Recheck INR 3 weeks. [facility] [phone].  Pt stopped taking amio on 3/7

## 2022-09-10 ENCOUNTER — Encounter (HOSPITAL_BASED_OUTPATIENT_CLINIC_OR_DEPARTMENT_OTHER): Payer: Medicare HMO | Attending: Internal Medicine | Admitting: Internal Medicine

## 2022-09-10 DIAGNOSIS — E1140 Type 2 diabetes mellitus with diabetic neuropathy, unspecified: Secondary | ICD-10-CM | POA: Diagnosis not present

## 2022-09-10 DIAGNOSIS — L97522 Non-pressure chronic ulcer of other part of left foot with fat layer exposed: Secondary | ICD-10-CM | POA: Diagnosis not present

## 2022-09-10 DIAGNOSIS — L97813 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: Secondary | ICD-10-CM | POA: Diagnosis not present

## 2022-09-10 DIAGNOSIS — Z794 Long term (current) use of insulin: Secondary | ICD-10-CM | POA: Insufficient documentation

## 2022-09-10 DIAGNOSIS — E1151 Type 2 diabetes mellitus with diabetic peripheral angiopathy without gangrene: Secondary | ICD-10-CM | POA: Diagnosis not present

## 2022-09-10 DIAGNOSIS — I5022 Chronic systolic (congestive) heart failure: Secondary | ICD-10-CM | POA: Diagnosis not present

## 2022-09-10 DIAGNOSIS — G4733 Obstructive sleep apnea (adult) (pediatric): Secondary | ICD-10-CM | POA: Insufficient documentation

## 2022-09-10 DIAGNOSIS — E11621 Type 2 diabetes mellitus with foot ulcer: Secondary | ICD-10-CM | POA: Diagnosis not present

## 2022-09-10 DIAGNOSIS — L97518 Non-pressure chronic ulcer of other part of right foot with other specified severity: Secondary | ICD-10-CM | POA: Diagnosis not present

## 2022-09-12 NOTE — Progress Notes (Signed)
JAMIAS, DAMM [name] ([id number]) [id number] Page 1 of 10 Visit Report for 09/10/2022 Arrival Information Details Patient Name: Date of Service: [name] [facility] [name]. 09/10/2022 8:45 [name]: [id number] Patient Account Number: [id number] Date of Birth/Sex: Treating RN: 09-26-1951 (71 y.o. M) Primary Care Yazaira Speas: [name] Other Clinician: Referring Zurii Hewes: Treating Sebastiana Wuest/Extender: Kathrene Alu in Treatment: 8 Visit Information History Since Last Visit Added or deleted any medications: No Patient Arrived: [name] Any new allergies or adverse reactions: No Arrival Time: 09:04 Had [name] fall or experienced change in No Accompanied By: daughter activities of daily living that may affect Transfer Assistance: None risk of falls: Patient Identification Verified: Yes Signs or symptoms of abuse/neglect since last visito No Secondary Verification Process Completed: Yes Hospitalized since last visit: No Patient Requires Transmission-Based Precautions: No Implantable device outside of the clinic excluding No Patient Has Alerts: Yes cellular tissue based products placed in the center Patient Alerts: Patient on Blood Thinner since last visit: Has Dressing in Place as Prescribed: Yes Pain Present Now: No Electronic Signature(s) Signed: 09/10/2022 11:40:36 AM By: Erenest Blank Entered By: Erenest Blank on 09/10/2022 09:05:17 -------------------------------------------------------------------------------- Encounter Discharge Information Details Patient Name: Date of Service: [name] [name] 09/10/2022 8:45 [name]: [id number] Patient Account Number: [id number] Date of Birth/Sex: Treating RN: 06-25-1951 (71 y.o. [name] Primary Care Romanita Fager: [name] Other Clinician: Referring Hibo Blasdell: Treating Kamsiyochukwu Buist/Extender: Kathrene Alu in Treatment: 8 Encounter  Discharge Information Items Post Procedure Vitals Discharge Condition: Stable Temperature (F): 98 Ambulatory Status: Cane Pulse (bpm): 69 Discharge Destination: Home Respiratory Rate (breaths/min): 20 Transportation: Private Auto Blood Pressure (mmHg): 176/77 Accompanied By: daughter Schedule Follow-up Appointment: Yes Clinical Summary of Care: Electronic Signature(s) Signed: 09/11/2022 5:25:29 PM By: Deon Pilling RN, BSN Entered By: Deon Pilling on 09/10/2022 09:55:56 Cordie Grice ([id number] Page 2 of 10 -------------------------------------------------------------------------------- Lower Extremity Assessment Details Patient Name: Date of Service: [name] [facility] [name]. 09/10/2022 8:45 [name]: [id number] Patient Account Number: [id number] Date of Birth/Sex: Treating RN: 1951-09-22 (71 y.o. M) Primary Care [name]: [name] Other Clinician: Referring Allenmichael Mcpartlin: Treating Kaydince Towles/Extender: Jayson Pierini, [name] in Treatment: 8 Edema Assessment Assessed: [Left: No] [Right: No] Edema: [Left: Yes] [Right: No] Calf Left: Right: Point of Measurement: 39 cm From Medial Instep 37.5 cm 36.6 cm Ankle Left: Right: Point of Measurement: 8 cm From Medial Instep 24.5 cm 24.5 cm Electronic Signature(s) Signed: 09/10/2022 11:40:36 AM By: Erenest Blank Entered By: Erenest Blank on 09/10/2022 09:28:12 -------------------------------------------------------------------------------- Multi Wound Chart Details Patient Name: Date of Service: [name] [name] 09/10/2022 8:45 [name]: [id number] Patient Account Number: [id number] Date of Birth/Sex: Treating RN: Nov 15, 1950 (71 y.o. M) Primary Care [name]: [name] Other Clinician: Referring [name]: Treating [name]/Extender: Nihar Pierini, [name] in Treatment: 8 Vital Signs Height(in): 68 Pulse(bpm):  67 Weight(lbs): 185 Blood Pressure(mmHg): 176/77 Body Mass Index(BMI): 28.1 Temperature(F): 98 Respiratory Rate(breaths/min): 18 [1:Photos:] [N/[name]:N/[name]] Right Achilles Left, Plantar Foot N/[name] Wound Location: Surgical Injury Gradually Appeared N/[name] Wounding Event: Diabetic Wound/Ulcer of the Lower Diabetic Wound/Ulcer of the Lower N/[name] Primary Etiology: Extremity Extremity Sleep Apnea, Arrhythmia, Congestive Sleep Apnea, Arrhythmia, Congestive N/[name] Comorbid History: Heart Failure, Hypertension, Peripheral Heart Failure, Hypertension, Peripheral JAGUR, AKSAMIT [name] ([id number] Page 3 of 10 Arterial Disease, Type II Diabetes Arterial Disease, Type II Diabetes 07/16/2022 03/09/2022 N/[name] Date Acquired: 8 8 N/[name] Weeks  of Treatment: Open Open N/[name] Wound Status: No No N/[name] Wound Recurrence: 5.8x2.5x0.3 0.7x0.8x0.3 N/[name] Measurements L x W x D (cm) 11.388 0.44 N/[name] [name] (cm) : rea 3.416 0.132 N/[name] Volume (cm) : 44.20% -100.00% N/[name] % Reduction in [name] rea: 66.50% -100.00% N/[name] % Reduction in Volume: Grade 2 Grade 2 N/[name] Classification: Medium Medium N/[name] Exudate [name] mount: Serosanguineous Serosanguineous N/[name] Exudate Type: red, brown red, brown N/[name] Exudate Color: Thickened Thickened N/[name] Wound Margin: Large (67-100%) Large (67-100%) N/[name] Granulation [name] mount: Red, Friable Pink, Pale N/[name] Granulation Quality: None Present (0%) None Present (0%) N/[name] Necrotic [name] mount: Fat Layer (Subcutaneous Tissue): Yes Fat Layer (Subcutaneous Tissue): Yes N/[name] Exposed Structures: Tendon: Yes Fascia: No Fascia: No Tendon: No Muscle: No Muscle: No Joint: No Joint: No Bone: No Bone: No Medium (34-66%) None N/[name] Epithelialization: Chemical/Enzymatic/Mechanical Debridement - Excisional N/[name] Debridement: Pre-procedure Verification/Time Out N/[name] 09:40 N/[name] Taken: N/[name] Lidocaine 5% topical ointment N/[name] Pain Control: N/[name] Callus, Subcutaneous, Slough N/[name] Tissue Debrided: N/[name]  Skin/Subcutaneous Tissue N/[name] Level: N/[name] 2 N/[name] Debridement [name] (sq cm): rea N/[name] Forceps N/[name] Instrument: None Moderate N/[name] Bleeding: N/[name] Silver Nitrate N/[name] Hemostasis [name] chieved: N/[name] 0 N/[name] Procedural Pain: N/[name] 0 N/[name] Post Procedural Pain: Procedure was tolerated well Procedure was tolerated well N/[name] Debridement Treatment Response: 5.8x2.5x0.3 1x1.5x0.3 N/[name] Post Debridement Measurements L x W x D (cm) 3.416 0.353 N/[name] Post Debridement Volume: (cm) Callus: Yes Callus: Yes N/[name] Periwound Skin Texture: Excoriation: No Excoriation: No Induration: No Induration: No Crepitus: No Crepitus: No Rash: No Rash: No Scarring: No Scarring: No Dry/Scaly: Yes Maceration: No N/[name] Periwound Skin Moisture: Maceration: No Dry/Scaly: No Atrophie Blanche: No Atrophie Blanche: No N/[name] Periwound Skin Color: Cyanosis: No Cyanosis: No Ecchymosis: No Ecchymosis: No Erythema: No Erythema: No Hemosiderin Staining: No Hemosiderin Staining: No Mottled: No Mottled: No Pallor: No Pallor: No Rubor: No Rubor: No No Abnormality No Abnormality N/[name] Temperature: N/[name] Debridement N/[name] Procedures Performed: Treatment Notes Wound #1 (Achilles) Wound Laterality: Right Cleanser Soap and Water Discharge Instruction: May shower and wash wound with dial antibacterial soap and water prior to dressing change. Wound Cleanser Discharge Instruction: Cleanse the wound with wound cleanser prior to applying [name] clean dressing using gauze sponges, not tissue or cotton balls. Peri-Wound Care Sween Lotion (Moisturizing lotion) Discharge Instruction: Apply moisturizing lotion as directed Topical Primary Dressing Hydrofera Blue Classic Foam, 4x4 in Discharge Instruction: Moisten with saline prior to applying to wound bed Santyl Ointment Discharge Instruction: Apply nickel thick amount to wound bed as instructed DERON, PONCE [name] ([id number]) [id number] Page 4 of 10 Secondary  Dressing ABD Pad, 5x9 Discharge Instruction: Apply over primary dressing as directed. Woven Gauze Sponge, Non-Sterile 4x4 in Discharge Instruction: Apply over primary dressing as directed. Secured With Compression Wrap Kerlix Roll 4.5x3.1 (in/yd) Discharge Instruction: Apply Kerlix and Coban compression as directed. Coban Self-Adherent Wrap 4x5 (in/yd) Discharge Instruction: Apply over Kerlix as directed. Compression Stockings Add-Ons Wound #2 (Foot) Wound Laterality: Plantar, Left Cleanser Dakins Solution Peri-Wound Care Skin Prep Discharge Instruction: Use skin prep as directed Topical Primary Dressing KerraCel Ag Gelling Fiber Dressing, 4x5 in (silver alginate) Discharge Instruction: Apply silver alginate to wound bed as instructed Secondary Dressing Optifoam Non-Adhesive Dressing, 4x4 in Discharge Instruction: Apply over as [name] foam donut. Woven Gauze Sponges 2x2 in Discharge Instruction: Apply over primary dressing as directed. Secured With [employer], 4.5x3.1 (in/yd) Discharge Instruction: Secure with Kerlix as directed. 35M Medipore H Soft Cloth Surgical T ape, 4 x 10 (in/yd) Discharge Instruction: Secure  with tape as directed. Compression Wrap Compression Stockings Add-Ons Electronic Signature(s) Signed: 09/10/2022 4:03:30 PM By: Linton Ham MD Entered By: Linton Ham on 09/10/2022 10:10:05 -------------------------------------------------------------------------------- Multi-Disciplinary Care Plan Details Patient Name: Date of Service: [name] [facility] [name]. 09/10/2022 8:45 [name]: [id number] Patient Account Number: [id number] Date of Birth/Sex: Treating RN: 1951-07-10 (71 y.o. [name] Primary Care Farrell Pantaleo: [name] Other Clinician: Referring Kisean Rollo: Treating Stanton Kissoon/Extender: Kathrene Alu in Treatment: [address] MCCABE, GLORIA [name] ([id number]) [id number] Page 5 of  10 Active Inactive Nutrition Nursing Diagnoses: Potential for alteratiion in Nutrition/Potential for imbalanced nutrition Goals: Patient/caregiver agrees to and verbalizes understanding of need to use nutritional supplements and/or vitamins as prescribed Date Initiated: 07/16/2022 Target Resolution Date: 10/23/2022 Goal Status: Active Patient/caregiver will maintain therapeutic glucose control Date Initiated: 07/16/2022 Target Resolution Date: 10/22/2022 Goal Status: Active Interventions: Assess HgA1c results as ordered upon admission and as needed Provide education on nutrition Treatment Activities: Education provided on Nutrition : 09/03/2022 Obtain HgA1c : 07/16/2022 Patient referred to Primary Care Physician for further nutritional evaluation : 07/16/2022 Notes: Pain, Acute or Chronic Nursing Diagnoses: Pain, acute or chronic: actual or potential Potential alteration in comfort, pain Goals: Patient will verbalize adequate pain control and receive pain control interventions during procedures as needed Date Initiated: 07/16/2022 Target Resolution Date: 10/22/2022 Goal Status: Active Interventions: Encourage patient to take pain medications as prescribed Provide education on pain management Reposition patient for comfort Treatment Activities: Administer pain control measures as ordered : 07/16/2022 Notes: Electronic Signature(s) Signed: 09/11/2022 5:25:29 PM By: Deon Pilling RN, BSN Entered By: Deon Pilling on 09/10/2022 09:25:29 -------------------------------------------------------------------------------- Pain Assessment Details Patient Name: Date of Service: [name] [name] 09/10/2022 8:45 [name]: [id number] Patient Account Number: [id number] Date of Birth/Sex: Treating RN: 1951/06/06 (71 y.o. M) Primary Care Renelle Stegenga: [name] Other Clinician: Referring Evan Osburn: Treating Aaditya Letizia/Extender: Kathrene Alu in  Treatment: 8 Active Problems Location of Pain Severity and Description of Pain Patient Has Paino No Site Locations MAHAMADOU, WELTZ [name] ([id number]) [id number] Page 6 of 10 Pain Management and Medication Current Pain Management: Electronic Signature(s) Signed: 09/10/2022 11:40:36 AM By: Erenest Blank Entered By: Erenest Blank on 09/10/2022 09:07:41 -------------------------------------------------------------------------------- Patient/Caregiver Education Details Patient Name: Date of Service: [name] [name]. 11/2/2023andnbsp8:45 [name]: [id number] Patient Account Number: [id number] Date of Birth/Gender: Treating RN: [birth year] (71 y.o. [name] Primary Care Physician: [name] Other Clinician: Referring Physician: Treating Physician/Extender: Kathrene Alu in Treatment: 8 Education Assessment Education Provided To: Patient Education Topics Provided Pain: Handouts: [name] Guide to Pain Control Methods: Explain/Verbal Responses: Reinforcements needed Electronic Signature(s) Signed: 09/11/2022 5:25:29 PM By: Deon Pilling RN, BSN Entered By: Deon Pilling on 09/10/2022 09:25:46 -------------------------------------------------------------------------------- Wound Assessment Details Patient Name: Date of Service: [name] [name] 09/10/2022 8:45 [name]: [id number] Patient Account Number: [id number] Date of Birth/Sex: Treating RN: Jul 21, 1951 (71 y.o. M) Primary Care Pat Elicker: [name] Other Clinician: ODAI, WIMMER ([id number]) [id number] Page 7 of 10 Referring Rashelle Ireland: Treating Kelsi Benham/Extender: Zackari Pierini, [name] in Treatment: 8 Wound Status Wound Number: 1 Primary Diabetic Wound/Ulcer of the Lower Extremity Etiology: Wound Location: Right Achilles Wound Open Wounding Event: Surgical Injury Status: Date  Acquired: 07/16/2022 Comorbid Sleep Apnea, Arrhythmia, Congestive Heart Failure, Hypertension, Weeks Of Treatment: 8 History: Peripheral Arterial Disease, Type II Diabetes Clustered Wound: No Photos Wound Measurements Length: (cm) 5.8  Width: (cm) 2.5 Depth: (cm) 0.3 Area: (cm) 11.388 Volume: (cm) 3.416 % Reduction in Area: 44.2% % Reduction in Volume: 66.5% Epithelialization: Medium (34-66%) Tunneling: No Undermining: No Wound Description Classification: Grade 2 Wound Margin: Thickened Exudate Amount: Medium Exudate Type: Serosanguineous Exudate Color: red, brown Foul Odor After Cleansing: No Slough/Fibrino Yes Wound Bed Granulation Amount: Large (67-100%) Exposed Structure Granulation Quality: Red, Friable Fascia Exposed: No Necrotic Amount: None Present (0%) Fat Layer (Subcutaneous Tissue) Exposed: Yes Tendon Exposed: Yes Muscle Exposed: No Joint Exposed: No Bone Exposed: No Periwound Skin Texture Texture Color No Abnormalities Noted: No No Abnormalities Noted: No Callus: Yes Atrophie Blanche: No Crepitus: No Cyanosis: No Excoriation: No Ecchymosis: No Induration: No Erythema: No Rash: No Hemosiderin Staining: No Scarring: No Mottled: No Pallor: No Moisture Rubor: No No Abnormalities Noted: Yes Temperature / Pain Temperature: No Abnormality Treatment Notes Wound #1 (Achilles) Wound Laterality: Right Cleanser Soap and Water Discharge Instruction: May shower and wash wound with dial antibacterial soap and water prior to dressing change. Wound Cleanser Discharge Instruction: Cleanse the wound with wound cleanser prior to applying [name] clean dressing using gauze sponges, not tissue or cotton balls. Peri-Wound Care EARLIE, FREDERICKS [name] ([id number]) [id number] Page 8 of 10 Sween Lotion (Moisturizing lotion) Discharge Instruction: Apply moisturizing lotion as directed Topical Primary Dressing Hydrofera Blue Classic Foam, 4x4  in Discharge Instruction: Moisten with saline prior to applying to wound bed Santyl Ointment Discharge Instruction: Apply nickel thick amount to wound bed as instructed Secondary Dressing ABD Pad, 5x9 Discharge Instruction: Apply over primary dressing as directed. Woven Gauze Sponge, Non-Sterile 4x4 in Discharge Instruction: Apply over primary dressing as directed. Secured With Compression Wrap Kerlix Roll 4.5x3.1 (in/yd) Discharge Instruction: Apply Kerlix and Coban compression as directed. Coban Self-Adherent Wrap 4x5 (in/yd) Discharge Instruction: Apply over Kerlix as directed. Compression Stockings Add-Ons Electronic Signature(s) Signed: 09/10/2022 11:40:36 AM By: Erenest Blank Entered By: Erenest Blank on 09/10/2022 09:31:14 -------------------------------------------------------------------------------- Wound Assessment Details Patient Name: Date of Service: [name] [facility] [name]. 09/10/2022 8:45 [name]: [id number] Patient Account Number: [id number] Date of Birth/Sex: Treating RN: December 24, 1950 (71 y.o. M) Primary Care Lakeysha Slutsky: [name] Other Clinician: Referring Leonetta Mcgivern: Treating Cadin Luka/Extender: Brentt Pierini, [name] in Treatment: 8 Wound Status Wound Number: 2 Primary Diabetic Wound/Ulcer of the Lower Extremity Etiology: Wound Location: Left, Plantar Foot Wound Open Wounding Event: Gradually Appeared Status: Date Acquired: 03/09/2022 Comorbid Sleep Apnea, Arrhythmia, Congestive Heart Failure, Hypertension, Weeks Of Treatment: 8 History: Peripheral Arterial Disease, Type II Diabetes Clustered Wound: No Photos Wound Measurements Length: (cm) 0.7 VERLEY, LAUDON [name] ([id number]) Width: (cm) 0.8 Depth: (cm) 0.3 Area: (cm) 0.44 Volume: (cm) 0.132 % Reduction in Area: -100% [id number] Page 9 of 10 % Reduction in Volume: -100% Epithelialization: None Tunneling: No Undermining: No Wound  Description Classification: Grade 2 Wound Margin: Thickened Exudate Amount: Medium Exudate Type: Serosanguineous Exudate Color: red, brown Foul Odor After Cleansing: No Slough/Fibrino No Wound Bed Granulation Amount: Large (67-100%) Exposed Structure Granulation Quality: Pink, Pale Fascia Exposed: No Necrotic Amount: None Present (0%) Fat Layer (Subcutaneous Tissue) Exposed: Yes Tendon Exposed: No Muscle Exposed: No Joint Exposed: No Bone Exposed: No Periwound Skin Texture Texture Color No Abnormalities Noted: No No Abnormalities Noted: No Callus: Yes Atrophie Blanche: No Crepitus: No Cyanosis: No Excoriation: No Ecchymosis: No Induration: No Erythema: No Rash: No Hemosiderin Staining: No Scarring: No Mottled: No Pallor: No Moisture Rubor: No No Abnormalities Noted: No Dry / Scaly: No Temperature / Pain Maceration: No Temperature:  No Abnormality Treatment Notes Wound #2 (Foot) Wound Laterality: Plantar, Left Cleanser Dakins Solution Peri-Wound Care Skin Prep Discharge Instruction: Use skin prep as directed Topical Primary Dressing KerraCel Ag Gelling Fiber Dressing, 4x5 in (silver alginate) Discharge Instruction: Apply silver alginate to wound bed as instructed Secondary Dressing Optifoam Non-Adhesive Dressing, 4x4 in Discharge Instruction: Apply over as [name] foam donut. Woven Gauze Sponges 2x2 in Discharge Instruction: Apply over primary dressing as directed. Secured With [employer], 4.5x3.1 (in/yd) Discharge Instruction: Secure with Kerlix as directed. 30M Medipore H Soft Cloth Surgical T ape, 4 x 10 (in/yd) Discharge Instruction: Secure with tape as directed. Compression Wrap Compression Stockings Add-Ons Electronic Signature(s) Signed: 09/10/2022 11:40:36 AM By: Erenest Blank Entered By: Erenest Blank on 09/10/2022 09:33:05 Cordie Grice ([id number] Page 10 of  10 -------------------------------------------------------------------------------- Vitals Details Patient Name: Date of Service: [name] [facility] [name]. 09/10/2022 8:45 [name]: [id number] Patient Account Number: [id number] Date of Birth/Sex: Treating RN: 1951/09/23 (71 y.o. M) Primary Care Jamine Highfill: [name] Other Clinician: Referring Solomon Skowronek: Treating Henessy Rohrer/Extender: Aeneas Pierini, [name] in Treatment: 8 Vital Signs Time Taken: 09:07 Temperature (F): 98 Height (in): 68 Pulse (bpm): 69 Weight (lbs): 185 Respiratory Rate (breaths/min): 18 Body Mass Index (BMI): 28.1 Blood Pressure (mmHg): 176/77 Reference Range: 80 - 120 mg / dl Electronic Signature(s) Signed: 09/10/2022 11:40:36 AM By: Erenest Blank Entered By: Erenest Blank on 09/10/2022 09:07:33

## 2022-09-12 NOTE — Progress Notes (Signed)
Darren, [name] ([id number]) [id number] Page 1 of 8 Visit Report for 09/10/2022 Debridement Details Patient Name: Date of Service: [name] [facility] [name]. 09/10/2022 8:45 [name]: [id number] Patient Account Number: [id number] Date of Birth/Sex: Treating RN: 27-Feb-1951 (71 y.Darren. M) Primary Care Provider: [name] Other Clinician: Referring Provider: Treating Provider/Extender: Kathrene Alu in Treatment: 8 Debridement Performed for Assessment: Wound #2 [place] Performed By: Physician Ricard Dillon., MD Debridement Type: Debridement Severity of Tissue Pre Debridement: Fat layer exposed Level of Consciousness (Pre-procedure): Awake and Alert Pre-procedure Verification/Time Out Yes - 09:40 Taken: Start Time: 09:41 Pain Control: Lidocaine 5% topical ointment T Area Debrided (L x W): otal 1 (cm) x 2 (cm) = 2 (cm) Tissue and other material debrided: Viable, Non-Viable, Callus, Slough, Subcutaneous, Skin: Dermis , Skin: Epidermis, Slough Level: Skin/Subcutaneous Tissue Debridement Description: Excisional Instrument: Forceps Bleeding: Moderate Hemostasis Achieved: Silver Nitrate End Time: 09:47 Procedural Pain: 0 Post Procedural Pain: 0 Response to Treatment: Procedure was tolerated well Level of Consciousness (Post- Awake and Alert procedure): Post Debridement Measurements of Total Wound Length: (cm) 1 Width: (cm) 1.5 Depth: (cm) 0.3 Volume: (cm) 0.353 Character of Wound/Ulcer Post Debridement: Requires Further Debridement Severity of Tissue Post Debridement: Fat layer exposed Post Procedure Diagnosis Same as Pre-procedure Electronic Signature(s) Signed: 09/10/2022 4:03:30 PM By: Linton Ham MD Entered By: Linton Ham on 09/10/2022 10:10:13 -------------------------------------------------------------------------------- Debridement Details Patient Name: Date of Service: [name]. 09/10/2022 8:45 [name]: [id number] Patient Account Number: [id number] Date of Birth/Sex: Treating RN: Apr 24, 1951 (59 y.Darren. [name] Primary Care Provider: [name] Other Clinician: Referring Provider: Treating Provider/Extender: Kathrene Alu in Treatment: 8 Debridement Performed for Assessment: Wound #1 Right Achilles Performed By: Clinician Deon Pilling, RN Debridement Type: Chemical/Enzymatic/Mechanical BRIANA, NEWMAN [name] ([id number]) [id number] Page 2 of 8 Agent Used: Santyl Severity of Tissue Pre Debridement: Fat layer exposed Level of Consciousness (Pre-procedure): Awake and Alert Pre-procedure Verification/Time Out No Taken: Bleeding: None Response to Treatment: Procedure was tolerated well Level of Consciousness (Post- Awake and Alert procedure): Post Debridement Measurements of Total Wound Length: (cm) 5.8 Width: (cm) 2.5 Depth: (cm) 0.3 Volume: (cm) 3.416 Character of Wound/Ulcer Post Debridement: Requires Further Debridement Severity of Tissue Post Debridement: Fat layer exposed Post Procedure Diagnosis Same as Pre-procedure Electronic Signature(s) Signed: 09/11/2022 12:29:37 PM By: Linton Ham MD Signed: 09/11/2022 5:25:29 PM By: Deon Pilling RN, BSN Entered By: Deon Pilling on 09/10/2022 17:03:21 -------------------------------------------------------------------------------- HPI Details Patient Name: Date of Service: [name]. 09/10/2022 8:45 [name]: [id number] Patient Account Number: [id number] Date of Birth/Sex: Treating RN: 05-27-1951 (41 y.Darren. M) Primary Care Provider: [name] Other Clinician: Referring Provider: Treating Provider/Extender: Kathrene Alu in Treatment: 8 History of Present Illness HPI Description: Admission 07/16/2022 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 He is not sure how the wounds started. He has been following with podiatry for this issue. He had removal of the tendon to the right lower extremity by Dr. SBlenda Mounts He has been using Betadine to the wound beds. On  06/11/2022 he had [name] right posterior tibial artery angioplasty by Dr. Carlis Abbott. It was reported the patient is optimized after revascularization of the right lower extremity. His ABIs on the left were 0.91. He currently denies systemic signs of infection. He also does not wear shoes. He came in with Kerlix wrap to his feet bilaterally. This did not completely cover his feet. 07/23/2022: [name] that she asked me to take [name] look at last week due to the significant involvement of the muscle and tendon on his right posterior leg wound. He needed an aggressive debridement and asked me if I would be able to perform this on her behalf. The patient is here today for that procedure. When his dressing was removed in clinic today, the wound was teeming with maggots. There is necrotic muscle, tendon, and fat, with [name] thick layer of slough on the wound. 9/21; patient presents for follow-up. He was debrided by Dr. Celine Ahr at last clinic visit without any issues. He has been placing Dakin's wet-to-dry dressings to the right posterior wound. He has been using Medihoney to the left foot wound. He reports improvement in wound healing. He has no issues or complaints today. 9/28; patient with type 2 diabetes PAD status post revascularization. He underwent retrograde right PTA angioplasty. Last saw Dr. Carlis Abbott on 9/12 and was felt  to have [name] brisk posterior tibial signal at the right ankle. He had [name] pulsatile toe tracing that appeared adequate for healing. He has been using Santyl and Hydrofera Blue on the right Achilles area. He has [name] smaller area on the left fifth plantar metatarsal head They were apparently in the ER on 9/23 with swelling and discoloration above the wrap. They have [name] picture of the leg after the wrap was taken off which looks like that it was excessively tight superiorly 10/6; patient presents for follow-up. We have been using Santyl and Hydrofera Blue to the right lower extremity wound under Kerlix/Coban. T the left foot we Darren have been using silver alginate with Medihoney. He again comes in with no shoes. He has no issues or complaints today. 10/12; patient presents for follow-up. We have been using Santyl and Hydrofera Blue to the right lower extremity under Kerlix/Coban And silver alginate to the left foot wound. He has no issues today. He denies signs of infection. 10/19; patient presents for follow-up. We continue to use Santyl and Hydrofera Blue to the right lower extremity under Kerlix/Coban and silver alginate to the left foot wound. There is been improvement in wound healing. [name], [name] ([id number]) [id number] Page 3 of 8 10/26; patient presents for follow-up. We have been using Santyl and Hydrofera Blue to the right lower extremity under Kerlix/Coban and silver alginate to the left foot. There continues to be improvement in wound healing. Patient has no issues or complaints today. 11/2; the patient's area on the right Achilles heel looks quite healthy and is improved in terms of measurements. We have been using Hydrofera Blue. He is approved for Grafix On the left plantar foot wound over the fifth metatarsal head this tunnels over the lateral part of the met head. He is not offloading this at all Electronic Signature(s) Signed: 09/10/2022 4:03:30 PM By:  Linton Ham MD Entered By: Linton Ham on 09/10/2022 10:11:22 -------------------------------------------------------------------------------- Physical Exam Details Patient Name: Date of Service: [place], [place] [name]. 09/10/2022 8:45 [name]: [id number] Patient Account Number: [id number] Date of Birth/Sex: Treating RN: 10-01-1951 (70  y.Darren. M) Primary Care Provider: [name] Other Clinician: Referring Provider: Treating Provider/Extender: Kathrene Alu in Treatment: 8 Notes Wound exam; His right Achilles heel wound looks quite healthy. The granulation looks healthy here no debridement is required. Measurements are improved. The left fifth metatarsal head wound is relatively deep. Very little tissue over bone here also problematically is it tunnels around the lateral part of the met head I used pickups and [name] #15 scalpel to remove skin and subcutaneous tissue from this area of the wound. Hemostasis with silver nitrate and [name] pressure dressing Electronic Signature(s) Signed: 09/10/2022 4:03:30 PM By: Linton Ham MD Entered By: Linton Ham on 09/10/2022 10:12:33 -------------------------------------------------------------------------------- Physician Orders Details Patient Name: Date of Service: [name]. 09/10/2022 8:45 [name]: [id number] Patient Account Number: [id number] Date of Birth/Sex: Treating RN: 07/18/1951 (41 y.Darren. 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 - Dr. Heber [place] Return [name] ppointment in 2 weeks. - Dr. Heber [place] Return [name] Other: - [facility]- will ship wound care supplies. Consider total contact cast to left foot/leg. HERSEL, MCMEEN [name] ([id number]) [id number] Page 4 of 8 Anesthetic (In clinic) Topical Lidocaine 5% applied to wound bed Cellular or Tissue Based Products Cellular or Tissue Based Product Type: - Grafix approved-waiting to hear about copay or coinsurance-cost for you out of your pocket. Kerecsis denied by insurance. Bathing/ Shower/ Hygiene May shower with protection but do not get wound dressing(s) wet. Edema Control - Lymphedema / SCD / Other Elevate legs to the level of the heart or above for 30 minutes daily and/or when sitting, [name] frequency of: - 3-4 times [name] day throughout the day. Avoid standing for long periods of time. Off-Loading Other: - minimize walking and standing to aid in offloading pressure to wound. Wound Treatment Wound #1 - Achilles Wound Laterality: Right Cleanser: Soap and Water 1 x Per Week/30 Days Discharge Instructions: May shower and wash wound with dial antibacterial soap and water prior to dressing change. Cleanser: Wound Cleanser 1 x Per Week/30 Days Discharge Instructions: Cleanse the wound with wound cleanser prior to applying [name] clean dressing using gauze sponges, not tissue or cotton balls. Peri-Wound Care: Sween Lotion (Moisturizing lotion) 1 x Per Week/30 Days Discharge Instructions: Apply moisturizing lotion as directed Prim Dressing: Hydrofera Blue Classic Foam, 4x4 in 1 x Per Week/30 Days ary Discharge Instructions: Moisten with saline prior to applying to wound bed Prim Dressing: Santyl Ointment 1 x Per Week/30 Days ary Discharge Instructions: Apply nickel thick amount to  wound bed as instructed Secondary Dressing: ABD Pad, 5x9 1 x Per Week/30 Days Discharge Instructions: Apply over primary dressing as directed. Secondary Dressing: Woven Gauze Sponge, Non-Sterile 4x4 in 1 x Per Week/30 Days Discharge Instructions: Apply over primary dressing as directed. Compression Wrap: Kerlix Roll 4.5x3.1 (in/yd) 1 x Per Week/30 Days Discharge Instructions: Apply Kerlix and Coban compression as directed. Compression Wrap: Coban Self-Adherent Wrap 4x5 (in/yd) 1 x Per Week/30 Days Discharge Instructions: Apply  over Kerlix as directed. Wound #2 - Foot Wound Laterality: Plantar, Left Cleanser: Dakins Solution 1 x Per Day/30 Days Peri-Wound Care: Skin Prep 1 x Per Day/30 Days Discharge Instructions: Use skin prep as directed Prim Dressing: KerraCel Ag Gelling Fiber Dressing, 4x5 in (silver alginate) 1 x Per Day/30 Days ary Discharge Instructions: Apply silver alginate to wound bed as instructed Secondary Dressing: Optifoam Non-Adhesive Dressing, 4x4 in 1 x Per Day/30 Days Discharge Instructions: Apply over as [name] foam donut. Secondary Dressing: Woven Gauze Sponges 2x2 in 1 x Per Day/30 Days Discharge Instructions: Apply over primary dressing as directed. Secured With: [employer], 4.5x3.1 (in/yd) 1 x Per Day/30 Days Discharge Instructions: Secure with Kerlix as directed. Secured With: 47M Medipore H Soft Cloth Surgical T ape, 4 x 10 (in/yd) 1 x Per Day/30 Days Discharge Instructions: Secure with tape as directed. Electronic Signature(s) Signed: 09/10/2022 4:03:30 PM By: Linton Ham MD Signed: 09/11/2022 5:25:29 PM By: Deon Pilling RN, BSN Entered By: Deon Pilling on 09/10/2022 09:54:58 [name] ([id number]) [id number] Page 5 of 8 -------------------------------------------------------------------------------- Problem List Details Patient Name: Date of Service: [name] [facility] [name]. 09/10/2022 8:45 [name]:  [id number] Patient Account Number: [id number] Date of Birth/Sex: Treating RN: 1951/03/03 (31 y.Darren. [name] Primary Care Provider: [name] Other Clinician: Referring Provider: Treating Provider/Extender: Kathrene Alu in Treatment: 8 Active Problems ICD-10 Encounter Code Description Active Date MDM Diagnosis L97.518 Non-pressure chronic ulcer of other part of right foot with other specified 07/16/2022 No Yes severity L97.522 Non-pressure chronic ulcer of other part of left foot with fat layer exposed 07/16/2022 No Yes E11.621 Type 2 diabetes mellitus with foot ulcer 07/16/2022 No Yes I73.9 Peripheral vascular disease, unspecified 07/16/2022 No Yes 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 07/23/2022 No Yes muscle Inactive Problems Resolved Problems Electronic Signature(s) Signed: 09/10/2022 4:03:30 PM By: Linton Ham MD Entered By: Linton Ham on 09/10/2022 10:09:58 -------------------------------------------------------------------------------- Progress Note Details Patient Name: Date of Service: [name]. 09/10/2022 8:45 [name]: [id number] Patient Account Number: [id number] Date of Birth/Sex: Treating RN: [birth year] (57 y.Darren. M) Primary Care Provider: [name] Other Clinician: Referring Provider: Treating Provider/Extender: Giovonni Pierini, Doneta Public in Treatment: 8 Subjective History of Present Illness (HPI) Admission 07/16/2022 Mr. 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 He is not  sure how the wounds started. He has been [name], [name] ([id number] Page 6 of 8 following with podiatry for this issue. He had removal of the tendon to the right lower extremity by Dr. SBlenda Mounts He has been using Betadine to the wound beds. On 06/11/2022 he had [name] right posterior tibial artery angioplasty by Dr. CCarlis Abbott It was reported the patient is optimized after revascularization of the right lower extremity. His ABIs on the left were 0.91. He currently denies systemic signs of infection. He also does not wear shoes. He came in with Kerlix wrap to his feet bilaterally. This did not completely cover his feet. 07/23/2022: This is [name] patient of Dr. HJodene Namthat she asked me to take [name]  look at last week due to the significant involvement of the muscle and tendon on his right posterior leg wound. He needed an aggressive debridement and asked me if I would be able to perform this on her behalf. The patient is here today for that procedure. When his dressing was removed in clinic today, the wound was teeming with maggots. There is necrotic muscle, tendon, and fat, with [name] thick layer of slough on the wound. 9/21; patient presents for follow-up. He was debrided by Dr. Celine Ahr at last clinic visit without any issues. He has been placing Dakin's wet-to-dry dressings to the right posterior wound. He has been using Medihoney to the left foot wound. He reports improvement in wound healing. He has no issues or complaints today. 9/28; patient with type 2 diabetes PAD status post revascularization. He underwent retrograde right PTA angioplasty. Last saw Dr. Carlis Abbott on 9/12 and was felt to have [name] brisk posterior tibial signal at the right ankle. He had [name] pulsatile toe tracing that appeared adequate for healing. He has been using Santyl and Hydrofera Blue on the right Achilles area. oo He has [name] smaller area on the left fifth plantar metatarsal head They were apparently in the ER  on 9/23 with swelling and discoloration above the wrap. They have [name] picture of the leg after the wrap was taken off which looks like that it was excessively tight superiorly 10/6; patient presents for follow-up. We have been using Santyl and Hydrofera Blue to the right lower extremity wound under Kerlix/Coban. T the left foot we Darren have been using silver alginate with Medihoney. He again comes in with no shoes. He has no issues or complaints today. 10/12; patient presents for follow-up. We have been using Santyl and Hydrofera Blue to the right lower extremity under Kerlix/Coban And silver alginate to the left foot wound. He has no issues today. He denies signs of infection. 10/19; patient presents for follow-up. We continue to use Santyl and Hydrofera Blue to the right lower extremity under Kerlix/Coban and silver alginate to the left foot wound. There is been improvement in wound healing. 10/26; patient presents for follow-up. We have been using Santyl and Hydrofera Blue to the right lower extremity under Kerlix/Coban and silver alginate to the left foot. There continues to be improvement in wound healing. Patient has no issues or complaints today. 11/2; the patient's area on the right Achilles heel looks quite healthy and is improved in terms of measurements. We have been using Hydrofera Blue. He is approved for Grafix On the left plantar foot wound over the fifth metatarsal head this tunnels over the lateral part of the met head. He is not offloading this at all Objective Constitutional Vitals Time Taken: 9:07 AM, Height: 68 in, Weight: 185 lbs, BMI: 28.1, Temperature: 98 F, Pulse: 69 bpm, Respiratory Rate: 18 breaths/min, Blood Pressure: 176/77 mmHg. Integumentary (Hair, Skin) Wound #1 status is Open. Original cause of wound was Surgical Injury. The date acquired was: 07/16/2022. The wound has been in treatment 8 weeks. The wound is located on the Right Achilles. The wound measures 5.8cm  length x 2.5cm width x 0.3cm depth; 11.388cm^2 area and 3.416cm^3 volume. There is tendon and Fat Layer (Subcutaneous Tissue) exposed. There is no tunneling or undermining noted. There is [name] medium amount of serosanguineous drainage noted. The wound margin is thickened. There is large (67-100%) red, friable granulation within the wound bed. There is no necrotic tissue within the wound bed. The periwound skin appearance had no  abnormalities noted for moisture. The periwound skin appearance exhibited: Callus. The periwound skin appearance did not exhibit: Crepitus, Excoriation, Induration, Rash, Scarring, Atrophie Blanche, Cyanosis, Ecchymosis, Hemosiderin Staining, Mottled, Pallor, Rubor, Erythema. Periwound temperature was noted as No Abnormality. Wound #2 status is Open. Original cause of wound was Gradually Appeared. The date acquired was: 03/09/2022. The wound has been in treatment 8 weeks. The wound is located on the [place]. The wound measures 0.7cm length x 0.8cm width x 0.3cm depth; 0.44cm^2 area and 0.132cm^3 volume. There is Fat Layer (Subcutaneous Tissue) exposed. There is no tunneling or undermining noted. There is [name] medium amount of serosanguineous drainage noted. The wound margin is thickened. There is large (67-100%) pink, pale granulation within the wound bed. There is no necrotic tissue within the wound bed. The periwound skin appearance exhibited: Callus. The periwound skin appearance did not exhibit: Crepitus, Excoriation, Induration, Rash, Scarring, Dry/Scaly, Maceration, Atrophie Blanche, Cyanosis, Ecchymosis, Hemosiderin Staining, Mottled, Pallor, Rubor, Erythema. Periwound temperature was noted as No Abnormality. 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 There was [name] Chemical/Enzymatic/Mechanical debridement performed by Deon Pilling, RN.Marland Kitchen Agent used was [employer]. There was no bleeding. The procedure was tolerated well. Post Debridement Measurements: 5.8cm length x 2.5cm width x 0.3cm depth; 3.416cm^3 volume. Character of Wound/Ulcer Post Debridement requires further debridement. Severity of Tissue Post Debridement is: Fat layer exposed. Post procedure Diagnosis Wound #1: Same as Pre-Procedure Wound #2 Pre-procedure diagnosis of Wound #2 is [name] Diabetic Wound/Ulcer of the Lower Extremity located on the Left,Plantar Foot .Severity of Tissue Pre Debridement is: Fat layer exposed. There was [name] Excisional Skin/Subcutaneous Tissue Debridement with [name] total area of 2 sq cm performed by Ricard Dillon., MD. With the following instrument(s): Forceps to remove Viable and Non-Viable tissue/material. Material removed includes Callus, Subcutaneous Tissue, Slough, Skin: Dermis, and Skin: Epidermis after achieving pain control using Lidocaine 5% topical ointment. [name] time out was conducted at 09:40, prior to the start of the procedure. [name] Moderate amount of bleeding was controlled with Silver Nitrate. The procedure was tolerated well with [name] pain level of 0 throughout and [name] pain level of 0 following the procedure. Post Debridement Measurements: 1cm length x 1.5cm width x 0.3cm depth; 0.353cm^3 volume. Character of Wound/Ulcer Post Debridement requires further debridement. Severity of Tissue Post Debridement is: Fat layer exposed. Post procedure Diagnosis Wound #2: Same as  Pre-Procedure Plan Follow-up Appointments: Return Appointment in 1 week. - Dr. Heber [place] Return Appointment in 2 weeks. - Dr. Heber [place] Other: - [facility]- will ship wound care supplies. Consider total contact cast to left foot/leg. Anesthetic: (In clinic) Topical Lidocaine 5% applied to wound bed Cellular or Tissue Based Products: Cellular or Tissue Based Product Type: - Grafix approved-waiting to hear about copay or coinsurance-cost for you out of your pocket. Kerecsis denied by insurance. Bathing/ Shower/ Hygiene: May shower with protection but do not get wound dressing(s) wet. Edema Control - Lymphedema / SCD / Other: Elevate legs to the level of the heart or  above for 30 minutes daily and/or when sitting, [name] frequency of: - 3-4 times [name] day throughout the day. Avoid standing for long periods of time. Off-Loading: Other: - minimize walking and standing to aid in offloading pressure to wound. WOUND #1: - Achilles Wound Laterality: Right Cleanser: Soap and Water 1 x Per Week/30 Days Discharge Instructions: May shower and wash wound with dial antibacterial soap and water prior to dressing change. Cleanser: Wound Cleanser 1 x Per Week/30 Days Discharge Instructions: Cleanse the wound with wound cleanser prior to applying [name] clean dressing using gauze sponges, not tissue or cotton balls. Peri-Wound Care: Sween Lotion (Moisturizing lotion) 1 x Per Week/30 Days Discharge Instructions: Apply moisturizing lotion as directed Prim Dressing: Hydrofera Blue Classic Foam, 4x4 in 1 x Per Week/30 Days ary Discharge Instructions: Moisten with saline prior to applying to wound bed Prim Dressing: Santyl Ointment 1 x Per Week/30 Days ary Discharge Instructions: Apply nickel thick amount to wound bed as instructed Secondary Dressing: ABD Pad, 5x9 1 x Per Week/30 Days Discharge Instructions: Apply over primary dressing as directed. Secondary Dressing: Woven Gauze Sponge, Non-Sterile 4x4 in 1 x Per Week/30  Days Discharge Instructions: Apply over primary dressing as directed. Com pression Wrap: Kerlix Roll 4.5x3.1 (in/yd) 1 x Per Week/30 Days Discharge Instructions: Apply Kerlix and Coban compression as directed. Com pression Wrap: Coban Self-Adherent Wrap 4x5 (in/yd) 1 x Per Week/30 Days Discharge Instructions: Apply over Kerlix as directed. WOUND #2: - Foot Wound Laterality: Plantar, Left Cleanser: Dakins Solution 1 x Per Day/30 Days Peri-Wound Care: Skin Prep 1 x Per Day/30 Days Discharge Instructions: Use skin prep as directed Prim Dressing: KerraCel Ag Gelling Fiber Dressing, 4x5 in (silver alginate) 1 x Per Day/30 Days ary Discharge Instructions: Apply silver alginate to wound bed as instructed Secondary Dressing: Optifoam Non-Adhesive Dressing, 4x4 in 1 x Per Day/30 Days Discharge Instructions: Apply over as [name] foam donut. Secondary Dressing: Woven Gauze Sponges 2x2 in 1 x Per Day/30 Days Discharge Instructions: Apply over primary dressing as directed. Secured With: [employer], 4.5x3.1 (in/yd) 1 x Per Day/30 Days Discharge Instructions: Secure with Kerlix as directed. Secured With: 44M Medipore H Soft Cloth Surgical T ape, 4 x 10 (in/yd) 1 x Per Day/30 Days Discharge Instructions: Secure with tape as directed. 1. Right Achilles heel wound looks quite good. Approved for [employer] we will look into this for next week. Not certain about what co-pay he might have however. Still using Hydrofera Blue 2. The area on his left fifth met metatarsal head will not heal without pressure relief. We went over [name] variety of options including [name] TCC and/or [name] scooter. Although he has balance problems and visual issues I think [name] TCC might be the best issue we talked about this in some detail with the patient and his family. Electronic Signature(s) Signed: 09/11/2022 12:29:37 PM By: Linton Ham MD Signed: 09/11/2022 5:25:29 PM By: Deon Pilling RN, BSN Previous Signature: 09/10/2022 4:03:30 PM Version  By: Linton Ham MD [name] ([id number]) [id number] Page 8 of 8 Entered By: Deon Pilling on 09/10/2022 17:05:02 -------------------------------------------------------------------------------- SuperBill Details Patient Name: Date of Service: [name] [facility] [name]. 09/10/2022 Medical Record Number: [id number] Patient Account Number: [id number] Date of Birth/Sex: Treating RN: 1951/04/26 (63 y.Darren. 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 Facility Procedures : CPT4 Code: 70962836 Description: 62947 - DEB SUBQ TISSUE 20 SQ CM/< ICD-10 Diagnosis Description L97.522 Non-pressure chronic ulcer of other part of left foot with fat layer exposed Modifier: Quantity: 1 : CPT4 Code: 65465035 Description: 46568 - DEBRIDE W/Darren ANES NON SELECT Modifier: 59 Quantity: 1 Physician Procedures : CPT4 Code Description Modifier [id number] 11042 - WC PHYS SUBQ TISS 20 SQ CM ICD-10 Diagnosis Description L97.522 Non-pressure chronic ulcer of other part of left foot with fat layer exposed Quantity: 1 Electronic Signature(s) Signed: 09/11/2022 12:29:37 PM By: Linton Ham MD Signed: 09/11/2022 5:25:29 PM By: Deon Pilling RN, BSN Previous Signature: 09/10/2022 4:03:30 PM Version By: Linton Ham MD Entered By: Deon Pilling on 09/10/2022 17:04:37

## 2022-09-17 ENCOUNTER — Encounter (HOSPITAL_BASED_OUTPATIENT_CLINIC_OR_DEPARTMENT_OTHER): Payer: Medicare HMO | Admitting: Internal Medicine

## 2022-09-17 DIAGNOSIS — Z794 Long term (current) use of insulin: Secondary | ICD-10-CM | POA: Diagnosis not present

## 2022-09-17 DIAGNOSIS — G4733 Obstructive sleep apnea (adult) (pediatric): Secondary | ICD-10-CM | POA: Diagnosis not present

## 2022-09-17 DIAGNOSIS — I5022 Chronic systolic (congestive) heart failure: Secondary | ICD-10-CM | POA: Diagnosis not present

## 2022-09-17 DIAGNOSIS — L97518 Non-pressure chronic ulcer of other part of right foot with other specified severity: Secondary | ICD-10-CM | POA: Diagnosis not present

## 2022-09-17 DIAGNOSIS — L97522 Non-pressure chronic ulcer of other part of left foot with fat layer exposed: Secondary | ICD-10-CM | POA: Diagnosis not present

## 2022-09-17 DIAGNOSIS — E11621 Type 2 diabetes mellitus with foot ulcer: Secondary | ICD-10-CM | POA: Diagnosis not present

## 2022-09-17 DIAGNOSIS — E1140 Type 2 diabetes mellitus with diabetic neuropathy, unspecified: Secondary | ICD-10-CM | POA: Diagnosis not present

## 2022-09-17 DIAGNOSIS — E1151 Type 2 diabetes mellitus with diabetic peripheral angiopathy without gangrene: Secondary | ICD-10-CM | POA: Diagnosis not present

## 2022-09-17 DIAGNOSIS — L97813 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: Secondary | ICD-10-CM | POA: Diagnosis not present

## 2022-09-17 NOTE — Progress Notes (Signed)
LENNIN, OSMOND A ([id number]) [id number] Page 1 of 4 Visit Report for 09/17/2022 Arrival Information Details Patient Name: Date of Service: [name] [facility] A. 09/17/2022 8:00 A M Medical Record Number: [id number] Patient Account Number: [id number] Date of Birth/Sex: Treating RN: [birth year] (71 y.o. [name] Primary Care Glenard Keesling: Richarda Blade Other Clinician: Referring Li Fragoso: Treating Tiaria Biby/Extender: Geralyn Corwin Ngetich, [name] in Treatment: 9 Visit Information History Since Last Visit Added or deleted any medications: No Patient Arrived: Cane Any new allergies or adverse reactions: No Arrival Time: 08:02 Had a fall or experienced change in No Accompanied By: daughter activities of daily living that may affect Transfer Assistance: None risk of falls: Patient Identification Verified: Yes Signs or symptoms of abuse/neglect since No Secondary Verification Process Completed: Yes last visito Patient Requires Transmission-Based Precautions: No Hospitalized since last visit: No Patient Has Alerts: Yes Implantable device outside of the clinic No Patient Alerts: Patient on Blood Thinner excluding cellular tissue based products placed in the center since last visit: Has Dressing in Place as Prescribed: Yes Has Footwear/Offloading in Place as Yes Prescribed: Left: Surgical Shoe with Pressure Relief Insole Right: Surgical Shoe with Pressure Relief Insole Pain Present Now: No Electronic Signature(s) Signed: 09/17/2022 3:27:23 PM By: Shawn Stall RN, BSN Entered By: Shawn Stall on 09/17/2022 08:29:14 -------------------------------------------------------------------------------- Encounter Discharge Information Details Patient Name: Date of Service: [name] [name] 09/17/2022 8:00 A M Medical Record Number: [id number] Patient Account Number: [id number] Date of Birth/Sex: Treating RN: 1951/02/15 (71 y.o. [name] Primary Care Kypton Eltringham: Richarda Blade Other Clinician: Referring Kaipo Ardis: Treating Fines Kimberlin/Extender: Geralyn Corwin Ngetich, [name] in Treatment: 9 Encounter Discharge Information Items Discharge Condition: Stable Ambulatory Status: Cane Discharge Destination: Home Transportation: Private Auto Accompanied By: daughter Schedule Follow-up Appointment: Yes Clinical Summary of Care: Electronic Signature(s) Signed: 09/17/2022 3:27:23 PM By: Shawn Stall RN, BSN Entered By: Shawn Stall on 09/17/2022 08:30:01 [name] ([id number]) [id number] Page 2 of 4 -------------------------------------------------------------------------------- Wound Assessment Details Patient Name: Date of Service: [name] [facility] A. 09/17/2022 8:00 A M Medical Record Number: [id number] Patient Account Number: [id number] Date of Birth/Sex: Treating RN: Feb 25, 1951 (71 y.o. [name] Primary Care Jovee Dettinger: Richarda Blade Other Clinician: Referring AmeLie Hollars: Treating Kasem Mozer/Extender: Geralyn Corwin Ngetich, [name] in Treatment: 9 Wound Status Wound Number: 1 Primary Etiology: Diabetic Wound/Ulcer of the Lower Extremity Wound Location: Right Achilles Wound Status: Open Wounding Event: Surgical Injury Date Acquired: 07/16/2022 Weeks Of Treatment: 9 Clustered Wound: No Wound Measurements Length: (cm) 5.8 Width: (cm) 2.5 Depth: (cm) 0.3 Area: (cm) 11.388 Volume: (cm) 3.416 % Reduction in Area: 44.2% % Reduction in Volume: 66.5% Wound Description Classification: Grade 2 Exudate Amount: Medium Exudate Type: Serosanguineous Exudate Color: red, brown Periwound Skin Texture Texture Color No Abnormalities Noted: No No Abnormalities Noted: No Moisture No Abnormalities Noted: No Treatment Notes Wound #1 (Achilles) Wound Laterality: Right Cleanser Soap and Water Discharge Instruction: May shower and wash wound with dial antibacterial  soap and water prior to dressing change. Wound Cleanser Discharge Instruction: Cleanse the wound with wound cleanser prior to applying a clean dressing using gauze sponges, not tissue or cotton balls. Peri-Wound Care Sween Lotion (Moisturizing lotion) Discharge Instruction: Apply moisturizing lotion as directed Topical Primary Dressing Hydrofera Blue Classic Foam, 4x4 in Discharge Instruction: Moisten with saline prior to applying to wound bed Santyl Ointment Discharge Instruction: Apply nickel thick amount to wound bed as instructed Secondary Dressing ABD Pad, 5x9 Discharge Instruction: Apply over primary dressing  as directed. Woven Gauze Sponge, Non-Sterile 4x4 in Discharge Instruction: Apply over primary dressing as directed. JAGGAR, BENKO A ([id number]) [id number] Page 3 of 4 Secured With Compression Wrap Kerlix Roll 4.5x3.1 (in/yd) Discharge Instruction: Apply Kerlix and Coban compression as directed. Coban Self-Adherent Wrap 4x5 (in/yd) Discharge Instruction: Apply over Kerlix as directed. Compression Stockings Add-Ons Electronic Signature(s) Signed: 09/17/2022 3:27:23 PM By: Shawn Stall RN, BSN Entered By: Shawn Stall on 09/17/2022 08:29:38 -------------------------------------------------------------------------------- Wound Assessment Details Patient Name: Date of Service: [name] [facility] A. 09/17/2022 8:00 A M Medical Record Number: [id number] Patient Account Number: [id number] Date of Birth/Sex: Treating RN: 1951/01/15 (70 y.o. [name] Primary Care Teondre Jarosz: Richarda Blade Other Clinician: Referring Makaveli Hoard: Treating Heidee Audi/Extender: Geralyn Corwin Ngetich, [name] in Treatment: 9 Wound Status Wound Number: 2 Primary Etiology: Diabetic Wound/Ulcer of the Lower Extremity Wound Location: Left, Plantar Foot Wound Status: Open Wounding Event: Gradually Appeared Date Acquired: 03/09/2022 Weeks Of Treatment:  9 Clustered Wound: No Wound Measurements Length: (cm) 0.7 Width: (cm) 0.8 Depth: (cm) 0.3 Area: (cm) 0.44 Volume: (cm) 0.132 % Reduction in Area: -100% % Reduction in Volume: -100% Wound Description Classification: Grade 2 Exudate Amount: Medium Exudate Type: Serosanguineous Exudate Color: red, brown Periwound Skin Texture Texture Color No Abnormalities Noted: No No Abnormalities Noted: No Moisture No Abnormalities Noted: No Treatment Notes Wound #2 (Foot) Wound Laterality: Plantar, Left Cleanser Dakins Solution Peri-Wound Care Skin Prep Discharge Instruction: Use skin prep as directed Topical Primary Dressing DOROTHY, LANDGREBE A ([id number]) [phone].pdf Page 4 of 4 KerraCel Ag Gelling Fiber Dressing, 4x5 in (silver alginate) Discharge Instruction: Apply silver alginate to wound bed as instructed Secondary Dressing Optifoam Non-Adhesive Dressing, 4x4 in Discharge Instruction: Apply over as a foam donut. Woven Gauze Sponges 2x2 in Discharge Instruction: Apply over primary dressing as directed. Secured With [employer], 4.5x3.1 (in/yd) Discharge Instruction: Secure with Kerlix as directed. 14M Medipore H Soft Cloth Surgical T ape, 4 x 10 (in/yd) Discharge Instruction: Secure with tape as directed. Compression Wrap Compression Stockings Add-Ons Electronic Signature(s) Signed: 09/17/2022 3:27:23 PM By: Shawn Stall RN, BSN Entered By: Shawn Stall on 09/17/2022 08:29:39

## 2022-09-17 NOTE — Progress Notes (Signed)
ANOTHY, BUFANO A ([id number]) [id number] Page 1 of 2 Visit Report for 09/17/2022 Debridement Details Patient Name: Date of Service: [name] [facility] A. 09/17/2022 8:00 A M Medical Record Number: [id number] Patient Account Number: [id number] Date of Birth/Sex: Treating RN: [birth year] (71 y.o. [name] Primary Care Provider: Richarda Blade Other Clinician: Referring Provider: Treating Provider/Extender: Geralyn Corwin Ngetich, [name] in Treatment: 9 Debridement Performed for Assessment: Wound #1 Right Achilles Performed By: Clinician Shawn Stall, RN Debridement Type: Chemical/Enzymatic/Mechanical Agent Used: Santyl Severity of Tissue Pre Debridement: Fat layer exposed Level of Consciousness (Pre-procedure): Awake and Alert Pre-procedure Verification/Time Out No Taken: Bleeding: None Response to Treatment: Procedure was tolerated well Level of Consciousness (Post- Awake and Alert procedure): Post Debridement Measurements of Total Wound Length: (cm) 5.8 Width: (cm) 2.5 Depth: (cm) 0.3 Volume: (cm) 3.416 Character of Wound/Ulcer Post Debridement: Requires Further Debridement Severity of Tissue Post Debridement: Fat layer exposed Electronic Signature(s) Signed: 09/17/2022 1:36:02 PM By: Geralyn Corwin DO Signed: 09/17/2022 3:27:23 PM By: Shawn Stall RN, BSN Entered By: Shawn Stall on 09/17/2022 08:31:15 -------------------------------------------------------------------------------- SuperBill Details Patient Name: Date of Service: Darren Crook [name] 09/17/2022 Medical Record Number: [id number] Patient Account Number: [id number] Date of Birth/Sex: Treating RN: [birth year] (71 y.o. 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

## 2022-09-24 ENCOUNTER — Encounter (HOSPITAL_BASED_OUTPATIENT_CLINIC_OR_DEPARTMENT_OTHER): Payer: Medicare HMO | Admitting: Internal Medicine

## 2022-09-24 DIAGNOSIS — E11621 Type 2 diabetes mellitus with foot ulcer: Secondary | ICD-10-CM | POA: Diagnosis not present

## 2022-09-24 DIAGNOSIS — I5022 Chronic systolic (congestive) heart failure: Secondary | ICD-10-CM | POA: Diagnosis not present

## 2022-09-24 DIAGNOSIS — L97813 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: Secondary | ICD-10-CM | POA: Diagnosis not present

## 2022-09-24 DIAGNOSIS — L97522 Non-pressure chronic ulcer of other part of left foot with fat layer exposed: Secondary | ICD-10-CM | POA: Diagnosis not present

## 2022-09-24 DIAGNOSIS — G4733 Obstructive sleep apnea (adult) (pediatric): Secondary | ICD-10-CM | POA: Diagnosis not present

## 2022-09-24 DIAGNOSIS — E1140 Type 2 diabetes mellitus with diabetic neuropathy, unspecified: Secondary | ICD-10-CM | POA: Diagnosis not present

## 2022-09-24 DIAGNOSIS — E1151 Type 2 diabetes mellitus with diabetic peripheral angiopathy without gangrene: Secondary | ICD-10-CM | POA: Diagnosis not present

## 2022-09-24 DIAGNOSIS — Z794 Long term (current) use of insulin: Secondary | ICD-10-CM | POA: Diagnosis not present

## 2022-09-24 DIAGNOSIS — L97518 Non-pressure chronic ulcer of other part of right foot with other specified severity: Secondary | ICD-10-CM | POA: Diagnosis not present

## 2022-09-25 NOTE — Progress Notes (Addendum)
[name], [name] ([id number]) [id number] Page 1 of 11 Visit Report for 09/24/2022 Chief Complaint Document Details Patient Name: Date of Service: [name] [facility] A. 09/24/2022 9:00 A M Medical Record Number: [id number] Patient Account Number: [id number] Date of Birth/Sex: Treating RN: 02-20-1951 (71 y.o. M) Primary Care Provider: [name] Other Clinician: Referring Provider: Treating Provider/Extender: [name], [name] in Treatment: 10 Information Obtained from: Patient Chief Complaint 07/16/2022; bilateral lower extremity wounds Electronic Signature(s) Signed: 09/24/2022 5:05:23 PM By: [name] DO Entered By: [name] on 09/24/2022 13:00:32 -------------------------------------------------------------------------------- Cellular or Tissue Based Product Details Patient Name: Date of Service: [name] [facility] A. 09/24/2022 9:00 A M Medical Record Number: [id number] Patient Account Number: [id number] Date of Birth/Sex: Treating RN: 1951-07-18 (71 y.o. [name] Primary Care Provider: [name] Other Clinician: Referring Provider: Treating Provider/Extender: [name] in Treatment: 10 Cellular or Tissue Based Product Type Wound #1 Right Achilles Applied to: Performed By: Physician [name], DO Cellular or Tissue Based Product Type: Grafix prime Level of Consciousness (Pre-procedure): Awake and Alert Pre-procedure Verification/Time Out Yes - 10:20 Taken: Location: genitalia / hands / feet / multiple digits Wound Size (sq cm): 8.25 Product Size (sq cm): 6 Waste Size (sq cm): 0 Amount of Product Applied (sq cm): 6 Instrument Used: Forceps, Scissors Lot #: [id number] Expiration Date: 01/20/2024 Fenestrated: No Reconstituted: Yes Solution Type: normal saline Solution Amount: 39m Lot #: [id number] Expiration Date: 03/08/2025 Secured:  Yes Secured With: Steri-Strips Dressing Applied: Yes Primary Dressing: Adaptic, gauze Procedural Pain: 0 Post Procedural Pain: 0 Response to Treatment: Procedure was tolerated well Level of Consciousness (Post- Awake and Alert procedure): OSAMIR, ISHAQA ([id number] Page 2 of 11 Post Procedure Diagnosis Same as Pre-procedure Electronic Signature(s) Signed: 09/24/2022 5:05:23 PM By: HKalman ShanDO Signed: 09/25/2022 12:09:11 PM By: BRhae HammockRN Entered By: BRhae Hammockon 09/24/2022 10:27:00 -------------------------------------------------------------------------------- Debridement Details Patient Name: Date of Service: [name] [name] A. 09/24/2022 9:00 A M Medical Record Number: [id number] Account Number: [id number] of Birth/Sex: Treating RN: 106-04-1951(71y.o. MBurnadette Pop [name] Primary Care Provider: NMarlowe SaxOther Clinician: Referring Provider: Treating Provider/Extender: HKalman ShanNgetich, [name] in Treatment: 10 Debridement Performed for Assessment: Wound #1 Right Achilles Performed By: Physician HKalman Shan DO Debridement Type: Debridement Severity of Tissue Pre Debridement: Fat layer exposed Level of Consciousness (Pre-procedure): Awake and Alert Pre-procedure Verification/Time Out Yes - 10:20 Taken: Start Time: 10:20 Pain Control: Lidocaine T Area Debrided (L x W): otal 5.5 (cm) x 1.5 (cm) = 8.25 (cm) Tissue and other material debrided: Viable, Non-Viable, Slough, Subcutaneous, Slough Level: Skin/Subcutaneous Tissue Debridement Description: Excisional Instrument: Curette Bleeding: Minimum Hemostasis Achieved: Pressure End Time: 10:20 Procedural Pain: 0 Post Procedural Pain: 0 Response to Treatment: Procedure was tolerated well Level of Consciousness (Post- Awake and Alert procedure): Post Debridement Measurements of Total Wound Length: (cm) 5.5 Width:  (cm) 1.5 Depth: (cm) 0.3 Volume: (cm) 1.944 Character of Wound/Ulcer Post Debridement: Improved Severity of Tissue Post Debridement: Fat layer exposed Post Procedure Diagnosis Same as Pre-procedure Electronic Signature(s) Signed: 09/24/2022 5:05:23 PM By: HKalman ShanDO Signed: 09/25/2022 12:09:11 PM By: BRhae HammockRN Entered By: BRhae Hammockon 09/24/2022 10:22:21 Debridement Details -------------------------------------------------------------------------------- [name]([id number] Page 3 of 11 Patient Name: [facility]: [name] 9:00 A M Medical Record Number: [id number] Account Number: [id number] of Birth/Sex: Treating RN: 1May 09, 1952(71  y.o. [name] Primary Care Provider: [name] Other Clinician: Referring Provider: Treating Provider/Extender: [name] Ngetich, [name] in Treatment: 10 Debridement Performed for Assessment: Wound #2 Left,Plantar Foot Performed By: Physician [name], DO Debridement Type: Debridement Severity of Tissue Pre Debridement: Fat layer exposed Level of Consciousness (Pre-procedure): Awake and Alert Pre-procedure Verification/Time Out Yes - 10:20 Taken: Start Time: 10:20 Pain Control: Lidocaine T Area Debrided (L x W): otal 1.2 (cm) x 1.8 (cm) = 2.16 (cm) Tissue and other material debrided: Viable, Non-Viable, Slough, Subcutaneous, Slough Level: Skin/Subcutaneous Tissue Debridement Description: Excisional Instrument: Curette Bleeding: Minimum Hemostasis Achieved: Pressure End Time: 10:20 Procedural Pain: 0 Post Procedural Pain: 0 Response to Treatment: Procedure was tolerated well Level of Consciousness (Post- Awake and Alert procedure): Post Debridement Measurements of Total Wound Length: (cm) 1.2 Width: (cm) 1.8 Depth: (cm) 0.2 Volume: (cm) 0.339 Character of Wound/Ulcer Post  Debridement: Improved Severity of Tissue Post Debridement: Fat layer exposed Post Procedure Diagnosis Same as Pre-procedure Electronic Signature(s) Signed: 09/24/2022 5:05:23 PM By: [name] DO Signed: 09/25/2022 12:09:11 PM By: Rhae Hammock RN Entered By: Rhae Hammock on 09/24/2022 10:28:54 -------------------------------------------------------------------------------- HPI Details Patient Name: Date of Service: [name] [name] 09/24/2022 9:00 A M Medical Record Number: [id number] Patient Account Number: [id number] Date of Birth/Sex: Treating RN: 1951-04-17 (71 y.o. M) Primary Care Provider: [name] Other Clinician: Referring Provider: Treating Provider/Extender: [name], [name] in Treatment: 10 History of Present Illness HPI Description: Admission 07/16/2022 Mr. 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 He is not sure how the wounds started. He has been following with podiatry for this issue. He had removal of the tendon to the right lower extremity by Dr. SBlenda Mounts He has been using Betadine to the wound beds. On 06/11/2022 he had a right posterior tibial artery angioplasty by Dr. CCarlis Abbott It was reported the patient is optimized after revascularization of the right lower extremity. His ABIs on the left were 0.91. He currently denies systemic signs of infection. He also does not wear shoes. He came in with Kerlix wrap to his feet bilaterally. This did not completely cover his feet. 07/23/2022: This is a patient of Dr. HJodene Namthat she asked me to take a look at last week  due to the significant involvement of the muscle and tendon on his right posterior leg wound. He needed an aggressive debridement and asked me if I would be able to perform this on her behalf. The patient is here today for that procedure. When his dressing was removed in clinic today, the wound was teeming with maggots. There is necrotic muscle, tendon, and fat, with a thick OAKI, ABALOSA ([id number] Page 4 of 11 layer of slough on the wound. 9/21; patient presents for follow-up. He was debrided by Dr. CCeline Ahrat last clinic visit without any issues. He has been placing Dakin's wet-to-dry dressings to the right posterior wound. He has been using Medihoney to the left foot wound. He reports improvement in wound healing. He has no issues or complaints today. 9/28; patient with type 2 diabetes PAD status post revascularization. He underwent retrograde right PTA angioplasty. Last saw Dr.  Clark on 9/12 and was felt to have a brisk posterior tibial signal at the right ankle. He had a pulsatile toe tracing that appeared adequate for healing. He has been using Santyl and Hydrofera Blue on the right Achilles area. He has a smaller area on the left fifth plantar metatarsal head They were apparently in the ER on 9/23 with swelling and discoloration above the wrap. They have a picture of the leg after the wrap was taken off which looks like that it was excessively tight superiorly 10/6; patient presents for follow-up. We have been using Santyl and Hydrofera Blue to the right lower extremity wound under Kerlix/Coban. T the left foot we o have been using silver alginate with Medihoney. He again comes in with no shoes. He has no issues or complaints today. 10/12; patient presents for follow-up. We have been using Santyl and Hydrofera Blue to the right lower extremity under Kerlix/Coban And silver alginate to the left foot wound. He has no issues today. He denies signs  of infection. 10/19; patient presents for follow-up. We continue to use Santyl and Hydrofera Blue to the right lower extremity under Kerlix/Coban and silver alginate to the left foot wound. There is been improvement in wound healing. 10/26; patient presents for follow-up. We have been using Santyl and Hydrofera Blue to the right lower extremity under Kerlix/Coban and silver alginate to the left foot. There continues to be improvement in wound healing. Patient has no issues or complaints today. 11/2; the patient's area on the right Achilles heel looks quite healthy and is improved in terms of measurements. We have been using Hydrofera Blue. He is approved for Grafix On the left plantar foot wound over the fifth metatarsal head this tunnels over the lateral part of the met head. He is not offloading this at all 11/16; patient presents for follow-up. He has been using a surgical shoe with an offloading felt pad to the left lateral foot along with silver alginate. He has been approved for Grafix and this is available for placement today T the right lower extremity wound. Patient Is agreeable to this. We have been using Santyl and o Hydrofera Blue under Kerlix/Coban to this area. Electronic Signature(s) Signed: 09/24/2022 5:05:23 PM By: [name] DO Entered By: [name] on 09/24/2022 13:03:02 -------------------------------------------------------------------------------- Physical Exam Details Patient Name: Date of Service: [name] [name] 09/24/2022 9:00 A M Medical Record Number: [id number] Patient Account Number: [id number] Date of Birth/Sex: Treating RN: 06/03/1951 (71 y.o. M) Primary Care Provider: [name] Other Clinician: Referring Provider: Treating Provider/Extender: [name] Ngetich, [name] in Treatment: 10 Constitutional respirations regular, non-labored and within target range for patient.. Cardiovascular 2+ dorsalis pedis/posterior  tibialis pulses. Psychiatric pleasant and cooperative. Notes Right lower extremity: T the posterior aspect there is An open wound with granulation tissue and slough. Left lower extremity: T the plantar aspect of the fifth o o met head there is an open wound with granulation tissue Nonviable tissue. No signs of surrounding infection T any of the wound beds. Venous stasis o dermatitis and lymphedema skin changes bilaterally Electronic Signature(s) Signed: 09/24/2022 5:05:23 PM By: [name] DO Entered By: [name] on 09/24/2022 13:04:21 [name] ([id number]) [id number] Page 5 of 11 -------------------------------------------------------------------------------- Physician Orders Details Patient Name: Date of Service: [name] [facility] A. 09/24/2022 9:00 A M Medical Record Number: [id number] Patient Account Number: [id number] Date of Birth/Sex: Treating RN: 1951/05/18 (71 y.o. [name] Primary Care Provider: [name]  Other Clinician: Referring Provider: Treating Provider/Extender: [name] Ngetich, [name] in Treatment: 10 Verbal / Phone Orders: No Diagnosis Coding Follow-up Appointments ppointment in 1 week. - Dr. [facility] on Tuesday Return A ppointment in 2 weeks. - w/ Dr. Heber [facility] on Thursday Return A Other: - [facility]- will ship wound care supplies. Consider total contact cast to left foot/leg. Anesthetic (In clinic) Topical Lidocaine 5% applied to wound bed Cellular or Tissue Based Products Cellular or Tissue Based Product Type: - Grafix approved-waiting to hear about copay or coinsurance-cost for you out of your pocket. Kerecsis denied by insurance. Grafix #1 applied on 09/24/22 Bathing/ Shower/ Hygiene May shower with protection but do not get wound dressing(s) wet. Edema Control - Lymphedema / SCD / Other Elevate legs to the level of the heart or above for 30 minutes daily and/or when  sitting, a frequency of: - 3-4 times a day throughout the day. Avoid standing for long periods of time. Off-Loading Other: - minimize walking and standing to aid in offloading pressure to wound. Wound Treatment Wound #1 - Achilles Wound Laterality: Right Cleanser: Soap and Water 1 x Per Week/30 Days Discharge Instructions: May shower and wash wound with dial antibacterial soap and water prior to dressing change. Cleanser: Wound Cleanser 1 x Per Week/30 Days Discharge Instructions: Cleanse the wound with wound cleanser prior to applying a clean dressing using gauze sponges, not tissue or cotton balls. Peri-Wound Care: Sween Lotion (Moisturizing lotion) 1 x Per Week/30 Days Discharge Instructions: Apply moisturizing lotion as directed Prim Dressing: Maxorb Extra Calcium Alginate Dressing, 4x4 in 1 x Per Week/30 Days ary Discharge Instructions: Apply calcium alginate to wound bed as instructed Prim Dressing: Grafix # 1 1 x Per Week/30 Days ary Discharge Instructions: secure with adaptic and steri strips Secondary Dressing: ABD Pad, 5x9 1 x Per Week/30 Days Discharge Instructions: Apply over primary dressing as directed. Secondary Dressing: Woven Gauze Sponge, Non-Sterile 4x4 in 1 x Per Week/30 Days Discharge Instructions: Apply over primary dressing as directed. Compression Wrap: Kerlix Roll 4.5x3.1 (in/yd) 1 x Per Week/30 Days Discharge Instructions: Apply Kerlix and Coban compression as directed. Compression Wrap: Coban Self-Adherent Wrap 4x5 (in/yd) 1 x Per Week/30 Days Discharge Instructions: Apply over Kerlix as directed. Wound #2 - Foot Wound Laterality: Plantar, Left Cleanser: Dakins Solution 1 x Per Day/30 Days Peri-Wound Care: Skin Prep 1 x Per Day/30 Days Discharge Instructions: Use skin prep as directed FAY, SWIDER A ([id number]) [id number] Page 6 of 11 Prim Dressing: KerraCel Ag Gelling Fiber Dressing, 4x5 in (silver alginate) 1 x Per Day/30  Days ary Discharge Instructions: Apply silver alginate to wound bed as instructed Secondary Dressing: Optifoam Non-Adhesive Dressing, 4x4 in 1 x Per Day/30 Days Discharge Instructions: Apply over as a foam donut. Secondary Dressing: Woven Gauze Sponges 2x2 in 1 x Per Day/30 Days Discharge Instructions: Apply over primary dressing as directed. Secured With: [employer], 4.5x3.1 (in/yd) 1 x Per Day/30 Days Discharge Instructions: Secure with Kerlix as directed. Secured With: 61M Medipore H Soft Cloth Surgical T ape, 4 x 10 (in/yd) 1 x Per Day/30 Days Discharge Instructions: Secure with tape as directed. Electronic Signature(s) Signed: 09/24/2022 5:05:23 PM By: [name] DO Entered By: [name] on 09/24/2022 13:04:30 -------------------------------------------------------------------------------- Problem List Details Patient Name: Date of Service: [name] [facility] A. 09/24/2022 9:00 A M Medical Record Number: [id number] Patient Account Number: [id number] Date of Birth/Sex: Treating RN: [birth year] (71 y.o. M) Primary Care Provider: [name] Other Clinician: Referring Provider: Treating  Provider/Extender: [name] Ngetich, [name] in Treatment: 10 Active Problems ICD-10 Encounter Code Description Active Date MDM Diagnosis L97.518 Non-pressure chronic ulcer of other part of right foot with other specified 07/16/2022 No Yes severity L97.522 Non-pressure chronic ulcer of other part of left foot with fat layer exposed 07/16/2022 No Yes E11.621 Type 2 diabetes mellitus with foot ulcer 07/16/2022 No Yes I73.9 Peripheral vascular disease, unspecified 07/16/2022 No Yes 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 07/23/2022 No Yes muscle Inactive Problems Resolved Problems Electronic Signature(s) Signed: 09/24/2022 5:05:23 PM By: [name] DO Entered By: [name] on 09/24/2022 13:00:15 Cordie Grice ([id number]) [id number] Page 7 of 11 -------------------------------------------------------------------------------- Progress Note Details Patient Name: Date of Service: [name] [facility] A. 09/24/2022 9:00 A M Medical Record Number: [id number] Patient Account Number: [id number] Date of Birth/Sex: Treating RN: 08-14-1951 (71 y.o. M) Primary Care Provider: [name] Other Clinician: Referring Provider: Treating Provider/Extender: [name], [name] in Treatment: 10 Subjective Chief Complaint Information obtained from Patient 07/16/2022; bilateral lower extremity wounds History of Present Illness (HPI) Admission 07/16/2022 Mr. 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 He is not sure how the wounds started. He has been following with podiatry for this issue. He had removal of the tendon to the right lower extremity by Dr. SBlenda Mounts He has been using Betadine to the wound beds. On 06/11/2022 he had a right posterior tibial artery angioplasty by Dr. CCarlis Abbott It was reported the patient is optimized after revascularization of the right lower extremity. His ABIs on the left were 0.91. He currently denies systemic signs of infection. He also does not wear shoes. He came in with Kerlix wrap to his feet bilaterally. This did not completely cover his feet. 07/23/2022: This is a patient of Dr. HJodene Namthat she asked me to take a look at last week due to the significant involvement of the muscle and tendon on his right posterior leg wound. He needed an aggressive debridement and  asked me if I would be able to perform this on her behalf. The patient is here today for that procedure. When his dressing was removed in clinic today, the wound was teeming with maggots. There is necrotic muscle, tendon, and fat, with a thick layer of slough on the wound. 9/21; patient presents for follow-up. He was debrided by Dr. CCeline Ahrat last clinic visit without any issues. He has been placing Dakin's wet-to-dry dressings to the right posterior wound. He has been using Medihoney to the left foot wound. He reports improvement in wound healing. He has no issues or complaints today. 9/28; patient with type 2 diabetes PAD status post revascularization. He underwent retrograde right PTA angioplasty. Last saw Dr. CCarlis Abbotton 9/12 and was felt to have a brisk posterior tibial signal at the right ankle. He had a pulsatile toe tracing that appeared adequate for healing. He has been using Santyl and Hydrofera Blue on the right Achilles area. oo He has a smaller area on the left fifth plantar metatarsal head They were apparently in the ER on 9/23 with swelling and discoloration above the wrap. They have  a picture of the leg after the wrap was taken off which looks like that it was excessively tight superiorly 10/6; patient presents for follow-up. We have been using Santyl and Hydrofera Blue to the right lower extremity wound under Kerlix/Coban. T the left foot we o have been using silver alginate with Medihoney. He again comes in with no shoes. He has no issues or complaints today. 10/12; patient presents for follow-up. We have been using Santyl and Hydrofera Blue to the right lower extremity under Kerlix/Coban And silver alginate to the left foot wound. He has no issues today. He denies signs of infection. 10/19; patient presents for follow-up. We continue to use Santyl and Hydrofera Blue to the right lower extremity under Kerlix/Coban and silver alginate to the left foot wound. There is been improvement  in wound healing. 10/26; patient presents for follow-up. We have been using Santyl and Hydrofera Blue to the right lower extremity under Kerlix/Coban and silver alginate to the left foot. There continues to be improvement in wound healing. Patient has no issues or complaints today. 11/2; the patient's area on the right Achilles heel looks quite healthy and is improved in terms of measurements. We have been using Hydrofera Blue. He is approved for Grafix On the left plantar foot wound over the fifth metatarsal head this tunnels over the lateral part of the met head. He is not offloading this at all 11/16; patient presents for follow-up. He has been using a surgical shoe with an offloading felt pad to the left lateral foot along with silver alginate. He has been approved for Grafix and this is available for placement today T the right lower extremity wound. Patient Is agreeable to this. We have been using Santyl and o Hydrofera Blue under Kerlix/Coban to this area. Patient History Information obtained from Chart. Family History Diabetes - Mother, Heart Disease - Mother,Siblings. Social History Current every day smoker, Alcohol Use - Never, Drug Use - No History, Caffeine Use - Never. Medical History Respiratory Patient has history of Sleep Apnea - does not wear Cardiovascular Patient has history of Arrhythmia - A. Fib, Congestive Heart Failure - EF 25%, Hypertension, Peripheral Arterial Disease Denies history of Peripheral Venous Disease CAMPBELL, KRAY A ([id number]) [id number] Page 8 of 11 Endocrine Patient has history of Type II Diabetes Hospitalization/Surgery History - Angiogram 06/11/2022 Dr. Carlis Abbott [facility]. - cardioversion 11/06/2021, 2015, 2017. 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 Medical A Surgical History Notes nd Gastrointestinal Ascites Genitourinary stage III Kidney disease Objective Constitutional respirations regular, non-labored and within  target range for patient.. Vitals Time Taken: 9:20 AM, Height: 68 in, Weight: 185 lbs, BMI: 28.1, Temperature: 98 F, Pulse: 80 bpm, Respiratory Rate: 18 breaths/min, Blood Pressure: 123/84 mmHg. Cardiovascular 2+ dorsalis pedis/posterior tibialis pulses. Psychiatric pleasant and cooperative. General Notes: Right lower extremity: T the posterior aspect there is An open wound with granulation tissue and slough. Left lower extremity: T the plantar o o aspect of the fifth met head there is an open wound with granulation tissue Nonviable tissue. No signs of surrounding infection T any of the wound beds. o Venous stasis dermatitis and lymphedema skin changes bilaterally Integumentary (Hair, Skin) Wound #1 status is Open. Original cause of wound was Surgical Injury. The date acquired was: 07/16/2022. The wound has been in treatment 10 weeks. The wound is located on the Right Achilles. The wound measures 5.5cm length x 1.5cm width x 0.3cm depth; 6.48cm^2 area and 1.944cm^3 volume. There is no tunneling or undermining noted. There is  a medium amount of serosanguineous drainage noted. There is large (67-100%) granulation within the wound bed. The periwound skin appearance did not exhibit: Callus, Crepitus, Excoriation, Induration, Rash, Scarring, Dry/Scaly, Maceration, Atrophie Blanche, Cyanosis, Ecchymosis, Hemosiderin Staining, Mottled, Pallor, Rubor, Erythema. Wound #2 status is Open. Original cause of wound was Gradually Appeared. The date acquired was: 03/09/2022. The wound has been in treatment 10 weeks. The wound is located on the [place]. The wound measures 1.2cm length x 1.8cm width x 0.2cm depth; 1.696cm^2 area and 0.339cm^3 volume. There is no undermining noted, however, there is tunneling at 12:00 with a maximum distance of 0.2cm. There is a medium amount of serosanguineous drainage noted. There is large (67-100%) granulation within the wound bed. There is a small (1-33%) amount of  necrotic tissue within the wound bed. The periwound skin appearance exhibited: Callus. The periwound skin appearance did not exhibit: Crepitus, Excoriation, Induration, Rash, Scarring, Dry/Scaly, Maceration, Atrophie Blanche, Cyanosis, Ecchymosis, Hemosiderin Staining, Mottled, Pallor, Rubor, Erythema. 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 I debrided nonviable tissue. Grafix #1 was placed in standard fashion today T this area. I o recommended continuing with Kerlix/Coban. The left plantar foot wound has become larger. This was debrided by Dr. Dellia Nims last clinic visit. I carefully debrided nonviable tissue. I recommended aggressive offloading and continuing with silver alginate. The total contact cast was discussed at last clinic visit by Dr. Dellia Nims. He states he has low vision but does not think he is a high fall risk. He uses a cane to ambulate. Scheduling does not allow for total contact cast at this time. We will readdress this at next clinic visit. For now continue with surgical shoe and offloading foam donut Procedures Wound #1 Pre-procedure diagnosis of Wound #1 is a Diabetic Wound/Ulcer of the Lower Extremity located on the Right Achilles .Severity of Tissue Pre Debridement is: Fat layer exposed. There was a Excisional Skin/Subcutaneous Tissue Debridement with a total area of 8.25 sq cm performed by [name], DO. With the following instrument(s): Curette to remove Viable and Non-Viable tissue/material. Material removed includes Subcutaneous Tissue and Slough and after achieving pain control using  Lidocaine. No specimens were taken. A time out was conducted at 10:20, prior to the start of the procedure. A Minimum amount of bleeding was controlled with Pressure. The procedure was tolerated well with a pain level of 0 throughout and a pain level of 0 following the procedure. [address] STRYDER, POITRA A ([id number]) [id number] Page 9 of 11 Debridement Measurements: 5.5cm length x 1.5cm width x 0.3cm depth; 1.944cm^3 volume. Character of Wound/Ulcer Post Debridement is improved. Severity of Tissue Post Debridement is: Fat layer exposed. Post procedure Diagnosis Wound #1: Same as Pre-Procedure Pre-procedure diagnosis of Wound #1 is a Diabetic Wound/Ulcer of the Lower Extremity located on the Right Achilles. A skin graft procedure using a bioengineered skin substitute/cellular or tissue based product was performed by [name], DO with the following instrument(s): Forceps and Scissors. Grafix prime was applied and secured with Steri-Strips. 6 sq cm of product was utilized and 0 sq cm was wasted. Post Application, Adaptic, gauze was applied. A Time Out was conducted at 10:20, prior to the start of  the procedure. The procedure was tolerated well with a pain level of 0 throughout and a pain level of 0 following the procedure. Post procedure Diagnosis Wound #1: Same as Pre-Procedure . Wound #2 Pre-procedure diagnosis of Wound #2 is a Diabetic Wound/Ulcer of the Lower Extremity located on the [place] .Severity of Tissue Pre Debridement is: Fat layer exposed. There was a Excisional Skin/Subcutaneous Tissue Debridement with a total area of 2.16 sq cm performed by [name], DO. With the following instrument(s): Curette to remove Viable and Non-Viable tissue/material. Material removed includes Subcutaneous Tissue and Slough and after achieving pain control using Lidocaine. No specimens were taken. A time out was conducted at 10:20, prior to the start of the  procedure. A Minimum amount of bleeding was controlled with Pressure. The procedure was tolerated well with a pain level of 0 throughout and a pain level of 0 following the procedure. Post Debridement Measurements: 1.2cm length x 1.8cm width x 0.2cm depth; 0.339cm^3 volume. Character of Wound/Ulcer Post Debridement is improved. Severity of Tissue Post Debridement is: Fat layer exposed. Post procedure Diagnosis Wound #2: Same as Pre-Procedure Plan Follow-up Appointments: Return Appointment in 1 week. - Dr. Heber [place] on Tuesday Return Appointment in 2 weeks. - w/ Dr. Heber [place] on Thursday Other: - [facility]- will ship wound care supplies. Consider total contact cast to left foot/leg. Anesthetic: (In clinic) Topical Lidocaine 5% applied to wound bed Cellular or Tissue Based Products: Cellular or Tissue Based Product Type: - Grafix approved-waiting to hear about copay or coinsurance-cost for you out of your pocket. Kerecsis denied by insurance. Grafix #1 applied on 09/24/22 Bathing/ Shower/ Hygiene: May shower with protection but do not get wound dressing(s) wet. Edema Control - Lymphedema / SCD / Other: Elevate legs to the level of the heart or above for 30 minutes daily and/or when sitting, a frequency of: - 3-4 times a day throughout the day. Avoid standing for long periods of time. Off-Loading: Other: - minimize walking and standing to aid in offloading pressure to wound. WOUND #1: - Achilles Wound Laterality: Right Cleanser: Soap and Water 1 x Per Week/30 Days Discharge Instructions: May shower and wash wound with dial antibacterial soap and water prior to dressing change. Cleanser: Wound Cleanser 1 x Per Week/30 Days Discharge Instructions: Cleanse the wound with wound cleanser prior to applying a clean dressing using gauze sponges, not tissue or cotton balls. Peri-Wound Care: Sween Lotion (Moisturizing lotion) 1 x Per Week/30 Days Discharge Instructions: Apply moisturizing lotion as  directed Prim Dressing: Maxorb Extra Calcium Alginate Dressing, 4x4 in 1 x Per Week/30 Days ary Discharge Instructions: Apply calcium alginate to wound bed as instructed Prim Dressing: Grafix # 1 1 x Per Week/30 Days ary Discharge Instructions: secure with adaptic and steri strips Secondary Dressing: ABD Pad, 5x9 1 x Per Week/30 Days Discharge Instructions: Apply over primary dressing as directed. Secondary Dressing: Woven Gauze Sponge, Non-Sterile 4x4 in 1 x Per Week/30 Days Discharge Instructions: Apply over primary dressing as directed. Com pression Wrap: Kerlix Roll 4.5x3.1 (in/yd) 1 x Per Week/30 Days Discharge Instructions: Apply Kerlix and Coban compression as directed. Com pression Wrap: Coban Self-Adherent Wrap 4x5 (in/yd) 1 x Per Week/30 Days Discharge Instructions: Apply over Kerlix as directed. WOUND #2: - Foot Wound Laterality: Plantar, Left Cleanser: Dakins Solution 1 x Per Day/30 Days Peri-Wound Care: Skin Prep 1 x Per Day/30 Days Discharge Instructions: Use skin prep as directed Prim Dressing: KerraCel Ag Gelling Fiber Dressing, 4x5 in (silver alginate) 1 x Per Day/30  Days ary Discharge Instructions: Apply silver alginate to wound bed as instructed Secondary Dressing: Optifoam Non-Adhesive Dressing, 4x4 in 1 x Per Day/30 Days Discharge Instructions: Apply over as a foam donut. Secondary Dressing: Woven Gauze Sponges 2x2 in 1 x Per Day/30 Days Discharge Instructions: Apply over primary dressing as directed. Secured With: [employer], 4.5x3.1 (in/yd) 1 x Per Day/30 Days Discharge Instructions: Secure with Kerlix as directed. Secured With: 35M Medipore H Soft Cloth Surgical T ape, 4 x 10 (in/yd) 1 x Per Day/30 Days Discharge Instructions: Secure with tape as directed. 1. In office sharp debridement 2. Grafix placed in standard fashion to the right lower extremity 3. Kerlix/Cobanooright lower extremity 4. Silver alginate left lateral foot 5. Surgical shoe with  foam donutooleft foot GENNARO, LIZOTTE A ([id number]) [id number] Page 10 of 11 Electronic Signature(s) Signed: 10/09/2022 9:31:37 AM By: [name] DO Previous Signature: 09/24/2022 5:05:23 PM Version By: [name] DO Entered By: [name] on 10/08/2022 10:08:38 -------------------------------------------------------------------------------- HxROS Details Patient Name: Date of Service: [name] [name] 09/24/2022 9:00 A M Medical Record Number: [id number] Patient Account Number: [id number] Date of Birth/Sex: Treating RN: [birth year] (71 y.o. M) Primary Care Provider: [name] Other Clinician: Referring Provider: Treating Provider/Extender: [name] Ngetich, [name] in Treatment: 10 Information Obtained From Chart Respiratory Medical History: Positive for: Sleep Apnea - does not wear Cardiovascular Medical History: Positive for: Arrhythmia - A. Fib; Congestive Heart Failure - EF 25%; Hypertension; Peripheral Arterial Disease Negative for: Peripheral Venous Disease Gastrointestinal Medical History: Past Medical History Notes: Ascites Endocrine Medical History: Positive for: Type II Diabetes Time with diabetes: 6 years Treated with: Insulin Blood sugar tested every day: No Genitourinary Medical History: Past Medical History Notes: stage III Kidney disease Immunizations Pneumococcal Vaccine: Received Pneumococcal Vaccination: No Implantable Devices No devices added Hospitalization / Surgery History Type of Hospitalization/Surgery Angiogram 06/11/2022 Dr. 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 Family and Social History Diabetes: Yes - Mother; Heart Disease: Yes - Mother,Siblings; Current every day smoker; Alcohol Use: Never; Drug Use: No History; Caffeine Use: Never; Financial Concerns: No; Food, Clothing or Shelter Needs: No; Support System Lacking: No;  Transportation Concerns: No Electronic Signature(s) BRICE, KOSSMAN A ([id number]) [id number] Page 11 of 11 Signed: 09/24/2022 5:05:23 PM By: [name] DO Entered By: [name] on 09/24/2022 13:03:08 -------------------------------------------------------------------------------- SuperBill Details Patient Name: Date of Service: [name] [name] 09/24/2022 Medical Record Number: [id number] Patient Account Number: [id number] Date of Birth/Sex: Treating RN: 19-Aug-1951 (71 y.o. 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 Facility Procedures : CPT4 Code: 14431540 Description: G8676- Grafix PL 16 mm disc Modifier: Quantity: 6 : CPT4 Code: 19509326 Description: 71245 - SKIN SUB GRAFT FACE/NK/HF/G ICD-10 Diagnosis Description 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 Modifier: uscle Quantity: 1 : CPT4 Code: 80998338 Description: 25053 - DEB SUBQ TISSUE 20 SQ CM/< ICD-10 Diagnosis Description L97.813 Non-pressure chronic ulcer of other part of right lower  leg with necrosis of m L97.522 Non-pressure chronic ulcer of other part of left foot with fat layer exposed  E11.621 Type 2 diabetes mellitus with foot ulcer Modifier: uscle Quantity: 1 Physician Procedures : CPT4 Code Description Modifier [id number] 15275 - WC PHYS SKIN SUB  GRAFT FACE/NK/HF/G ICD-10 Diagnosis Description 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

## 2022-09-25 NOTE — Progress Notes (Signed)
ROB, MCIVER [name] [id number] Page 1 of 9 Visit Report for 09/24/2022 Arrival Information Details Patient Name: Date of Service: Darren [name]. 09/24/2022 9:00 [name]: [id number] Patient Account Number: [id number] Date of Birth/Sex: Treating RN: 1951/08/16 (71 y.Darren. M) Primary Care December Hedtke: Richarda Blade Other Clinician: Referring Payzlee Ryder: Treating Marenda Accardi/Extender: Geralyn Corwin Ngetich, [name] in Treatment: 10 Visit Information History Since Last Visit Added or deleted any medications: No Patient Arrived: Cane Any new allergies or adverse reactions: No Arrival Time: 09:18 Had [name] fall or experienced change in No Accompanied By: daughter activities of daily living that may affect Transfer Assistance: None risk of falls: Patient Identification Verified: Yes Signs or symptoms of abuse/neglect since last visito No Secondary Verification Process Completed: Yes Hospitalized since last visit: No Patient Requires Transmission-Based Precautions: No Implantable device outside of the clinic excluding No Patient Has Alerts: Yes cellular tissue based products placed in the center Patient Alerts: Patient on Blood Thinner since last visit: Has Dressing in Place as Prescribed: Yes Has Compression in Place as Prescribed: Yes Pain Present Now: No Electronic Signature(s) Signed: 09/25/2022 12:40:42 PM By: Thayer Dallas Entered By: Thayer Dallas on 09/24/2022 09:18:54 -------------------------------------------------------------------------------- Encounter Discharge Information Details Patient Name: Date of Service: Darren [name]. 09/24/2022 9:00 [name]: [id number] Patient Account Number: [id number] Date of Birth/Sex: Treating RN: 1951/04/14 (69 y.Darren. [name] Primary Care Grey Rakestraw: Richarda Blade Other Clinician: Referring Ashea Winiarski: Treating Addelyn Alleman/Extender: Geralyn Corwin Ngetich, [name] in Treatment: 10 Encounter Discharge Information Items Post Procedure Vitals Discharge Condition: Stable Temperature (F): 98.7 Ambulatory Status: Ambulatory Pulse (bpm): 74 Discharge Destination: Home Respiratory Rate (breaths/min): 17 Transportation: Private Auto Blood Pressure (mmHg): 120/80 Accompanied By: daughter Schedule Follow-up Appointment: Yes Clinical Summary of Care: Patient Declined Electronic Signature(s) Signed: 09/25/2022 12:09:11 PM By: Fonnie Mu RN Entered By: Fonnie Mu on 09/24/2022 16:44:54 Darren [name], Darren [name] [id number] Page 2 of 9 -------------------------------------------------------------------------------- Lower Extremity Assessment Details Patient Name: Date of Service: Darren [name] [facility] [name]. 09/24/2022 9:00 [name]: [id number] Patient Account Number: [id number] Date of Birth/Sex: Treating RN: 05/22/1951 (14 y.Darren. M) Primary Care Cashe Gatt: Richarda Blade Other Clinician: Referring Jibri Schriefer: Treating Zakye Baby/Extender: Geralyn Corwin Ngetich, [name] in Treatment: 10 Edema Assessment Assessed: [Left: No] [Right: No] Edema: [Left: Yes] [Right: No] Calf Left: Right: Point of Measurement: 39 cm From Medial Instep 39 cm 37.5 cm Ankle Left: Right: Point of Measurement: 8 cm From Medial Instep 27 cm 25 cm Electronic Signature(s) Signed: 09/25/2022 12:40:42 PM By: Thayer Dallas Entered By: Thayer Dallas on 09/24/2022 09:35:20 -------------------------------------------------------------------------------- Multi Wound Chart Details Patient Name: Date of Service: Darren [name]. 09/24/2022 9:00 [name]: [id number] Patient Account Number: [id number] Date of Birth/Sex: Treating RN: 1951-09-03 (67 y.Darren. M) Primary Care Lygia Olaes: Richarda Blade Other Clinician: Referring Tykel Badie: Treating Chanoch Mccleery/Extender: Geralyn Corwin Ngetich, [name] in Treatment: 10 Vital Signs Height(in): 68 Pulse(bpm): 80 Weight(lbs): 185 Blood Pressure(mmHg): 123/84 Body Mass Index(BMI): 28.1 Temperature(F): 98 Respiratory Rate(breaths/min): 18 [1:Photos:] [N/[name]:N/[name]] Right Achilles Left, Plantar Foot N/[name] Wound Location: Surgical Injury Gradually Appeared N/[name] Wounding Event: Diabetic Wound/Ulcer of the Lower Diabetic Wound/Ulcer of the Lower N/[name] Primary Etiology: Extremity Extremity Sleep Apnea, Arrhythmia, Congestive Sleep Apnea, Arrhythmia, Congestive N/[name] Comorbid History: Heart Failure, Hypertension, Peripheral Heart Failure, Hypertension, Peripheral [name], Darren [name] [name] ([id number]) [id number] Page 3 of 9 Arterial Disease, Type II Diabetes Arterial Disease, Type II Diabetes 07/16/2022  03/09/2022 N/[name] Date [name] cquired: 10 10 N/[name] Weeks of Treatment: Open Open N/[name] Wound Status: No No N/[name] Wound Recurrence: 5.5x1.5x0.3 1.2x1.8x0.2 N/[name] Measurements L x W x D (cm) 6.48 1.696 N/[name] [name] (cm) : rea 1.944 0.339 N/[name] Volume (cm) : 68.30% -670.90% N/[name] % Reduction in [name] rea: 81.00% -413.60% N/[name] % Reduction in Volume: 12 Position 1 (Darren'clock): 0.2 Maximum Distance 1 (cm): No Yes N/[name] Tunneling: Grade 2 Grade 2 N/[name] Classification: Medium Medium N/[name] Exudate [name] mount: Serosanguineous Serosanguineous N/[name] Exudate Type: red, brown red, brown N/[name] Exudate Color: Large (67-100%) Large (67-100%) N/[name] Granulation [name] mount: N/[name] Small (1-33%) N/[name] Necrotic [name] mount: Debridement - Excisional Debridement - Excisional N/[name] Debridement: 10:20 10:20 N/[name] Pre-procedure Verification/Time Out Taken: Lidocaine Lidocaine N/[name] Pain Control: Subcutaneous, Slough Subcutaneous, Slough N/[name] Tissue Debrided: Skin/Subcutaneous Tissue Skin/Subcutaneous Tissue N/[name] Level: 8.25 2.16 N/[name] Debridement [name] (sq cm): rea Curette Curette N/[name] Instrument: Minimum Minimum N/[name] Bleeding: Pressure Pressure N/[name] Hemostasis [name]  chieved: 0 0 N/[name] Procedural Pain: 0 0 N/[name] Post Procedural Pain: Procedure was tolerated well Procedure was tolerated well N/[name] Debridement Treatment Response: 5.5x1.5x0.3 1.2x1.8x0.2 N/[name] Post Debridement Measurements L x W x D (cm) 1.944 0.339 N/[name] Post Debridement Volume: (cm) Excoriation: No Callus: Yes N/[name] Periwound Skin Texture: Induration: No Excoriation: No Callus: No Induration: No Crepitus: No Crepitus: No Rash: No Rash: No Scarring: No Scarring: No Maceration: No Maceration: No N/[name] Periwound Skin Moisture: Dry/Scaly: No Dry/Scaly: No Atrophie Blanche: No Atrophie Blanche: No N/[name] Periwound Skin Color: Cyanosis: No Cyanosis: No Ecchymosis: No Ecchymosis: No Erythema: No Erythema: No Hemosiderin Staining: No Hemosiderin Staining: No Mottled: No Mottled: No Pallor: No Pallor: No Rubor: No Rubor: No Cellular or Tissue Based Product Debridement N/[name] Procedures Performed: Debridement Treatment Notes Electronic Signature(s) Signed: 09/24/2022 5:05:23 PM By: Geralyn Corwin DO Entered By: Geralyn Corwin on 09/24/2022 13:00:21 -------------------------------------------------------------------------------- Multi-Disciplinary Care Plan Details Patient Name: Date of Service: Darren [name]. 09/24/2022 9:00 [name]: [id number] Patient Account Number: [id number] Date of Birth/Sex: Treating RN: 1951/08/12 (46 y.Darren. [name] Primary Care Lillian Tigges: Richarda Blade Other Clinician: Referring Almeta Geisel: Treating Illyria Sobocinski/Extender: Velna Ochs, [name] in Treatment: [address] ANSEN, [place] [name] ([id number]) [id number] Page 4 of 9 Nutrition Nursing Diagnoses: Potential for alteratiion in Nutrition/Potential for imbalanced nutrition Goals: Patient/caregiver agrees to and verbalizes understanding of need to use nutritional supplements and/or vitamins as prescribed Date  Initiated: 07/16/2022 Target Resolution Date: 10/23/2022 Goal Status: Active Patient/caregiver will maintain therapeutic glucose control Date Initiated: 07/16/2022 Target Resolution Date: 10/22/2022 Goal Status: Active Interventions: Assess HgA1c results as ordered upon admission and as needed Provide education on nutrition Treatment Activities: Education provided on Nutrition : 08/27/2022 Obtain HgA1c : 07/16/2022 Patient referred to Primary Care Physician for further nutritional evaluation : 07/16/2022 Notes: Pain, Acute or Chronic Nursing Diagnoses: Pain, acute or chronic: actual or potential Potential alteration in comfort, pain Goals: Patient will verbalize adequate pain control and receive pain control interventions during procedures as needed Date Initiated: 07/16/2022 Target Resolution Date: 10/22/2022 Goal Status: Active Interventions: Encourage patient to take pain medications as prescribed Provide education on pain management Reposition patient for comfort Treatment Activities: Administer pain control measures as ordered : 07/16/2022 Notes: Electronic Signature(s) Signed: 09/25/2022 12:09:11 PM By: Fonnie Mu RN Entered By: Fonnie Mu on 09/24/2022 10:19:47 -------------------------------------------------------------------------------- Pain Assessment Details Patient Name: Date of Service: Darren [name]. 09/24/2022 9:00 [name]: [id number] Patient Account Number: [id number] Date  of Birth/Sex: Treating RN: 02-26-1951 (71 y.Darren. M) Primary Care [facility]: Richarda Blade Other Clinician: Referring Clovia Reine: Treating Tekisha Darcey/Extender: Geralyn Corwin Ngetich, [name] in Treatment: 10 Active Problems Location of Pain Severity and Description of Pain Patient Has Paino No Site Locations EDAHI, KROENING [name] ([id number]) [id number] Page 5 of 9 Pain Management and Medication Current Pain  Management: Electronic Signature(s) Signed: 09/25/2022 12:40:42 PM By: Thayer Dallas Entered By: Thayer Dallas on 09/24/2022 09:21:18 -------------------------------------------------------------------------------- Patient/Caregiver Education Details Patient Name: Date of Service: Darren Alvine [name]. 11/16/2023andnbsp9:00 [name]: [id number] Patient Account Number: [id number] Date of Birth/Gender: Treating RN: 1951-03-31 (1 y.Darren. [name] Primary Care Physician: Richarda Blade Other Clinician: Referring Physician: Treating Physician/Extender: Jae Dire in Treatment: 10 Education Assessment Education Provided To: Patient Education Topics Provided Nutrition: Methods: Explain/Verbal Responses: State content correctly Pain: Methods: Explain/Verbal Responses: State content correctly Electronic Signature(s) Signed: 09/25/2022 12:09:11 PM By: Fonnie Mu RN Entered By: Fonnie Mu on 09/24/2022 10:20:05 -------------------------------------------------------------------------------- Wound Assessment Details Patient Name: Date of Service: Darren [name]. 09/24/2022 9:00 [name] Darren [name], Darren [name] ([id number]) [id number] Page 6 of 9 Medical Record Number: [id number] Patient Account Number: [id number] Date of Birth/Sex: Treating RN: 1951-09-15 (80 y.Darren. M) Primary Care Lumi Winslett: Richarda Blade Other Clinician: Referring Dianelys Scinto: Treating Favour Aleshire/Extender: Geralyn Corwin Ngetich, [name] in Treatment: 10 Wound Status Wound Number: 1 Primary Diabetic Wound/Ulcer of the Lower Extremity Etiology: Wound Location: Right Achilles Wound Open Wounding Event: Surgical Injury Status: Date Acquired: 07/16/2022 Comorbid Sleep Apnea, Arrhythmia, Congestive Heart Failure, Hypertension, Weeks Of Treatment: 10 History: Peripheral Arterial Disease, Type II Diabetes Clustered Wound:  No Photos Wound Measurements Length: (cm) 5.5 Width: (cm) 1.5 Depth: (cm) 0.3 Area: (cm) 6.48 Volume: (cm) 1.944 % Reduction in Area: 68.3% % Reduction in Volume: 81% Tunneling: No Undermining: No Wound Description Classification: Grade 2 Exudate Amount: Medium Exudate Type: Serosanguineous Exudate Color: red, brown Wound Bed Granulation Amount: Large (67-100%) Periwound Skin Texture Texture Color No Abnormalities Noted: No No Abnormalities Noted: No Callus: No Atrophie Blanche: No Crepitus: No Cyanosis: No Excoriation: No Ecchymosis: No Induration: No Erythema: No Rash: No Hemosiderin Staining: No Scarring: No Mottled: No Pallor: No Moisture Rubor: No No Abnormalities Noted: No Dry / Scaly: No Maceration: No Treatment Notes Wound #1 (Achilles) Wound Laterality: Right Cleanser Soap and Water Discharge Instruction: May shower and wash wound with dial antibacterial soap and water prior to dressing change. Wound Cleanser Discharge Instruction: Cleanse the wound with wound cleanser prior to applying [name] clean dressing using gauze sponges, not tissue or cotton balls. Peri-Wound Care Sween Lotion (Moisturizing lotion) Discharge Instruction: Apply moisturizing lotion as directed Darren [name], Darren [name] ([id number]) [id number] Page 7 of 9 Topical Primary Dressing Maxorb Extra Calcium Alginate Dressing, 4x4 in Discharge Instruction: Apply calcium alginate to wound bed as instructed Grafix # 1 Discharge Instruction: secure with adaptic and steri strips Secondary Dressing ABD Pad, 5x9 Discharge Instruction: Apply over primary dressing as directed. Woven Gauze Sponge, Non-Sterile 4x4 in Discharge Instruction: Apply over primary dressing as directed. Secured With Compression Wrap Kerlix Roll 4.5x3.1 (in/yd) Discharge Instruction: Apply Kerlix and Coban compression as directed. Coban Self-Adherent Wrap 4x5 (in/yd) Discharge Instruction: Apply  over Kerlix as directed. Compression Stockings Add-Ons Electronic Signature(s) Signed: 09/25/2022 12:40:42 PM By: Thayer Dallas Entered By: Thayer Dallas on 09/24/2022 09:39:29 -------------------------------------------------------------------------------- Wound Assessment Details Patient Name: Date of Service: Darren Pimple Rudie [name] [facility] [name]. 09/24/2022 9:00 [name]: [id number] Patient Account  Number: [id number] Date of Birth/Sex: Treating RN: [birth year] (62 y.Darren. M) Primary Care Garek Schuneman: Richarda Blade Other Clinician: Referring Kealani Leckey: Treating Vaun Hyndman/Extender: Geralyn Corwin Ngetich, [name] in Treatment: 10 Wound Status Wound Number: 2 Primary Diabetic Wound/Ulcer of the Lower Extremity Etiology: Wound Location: Left, Plantar Foot Wound Open Wounding Event: Gradually Appeared Status: Date Acquired: 03/09/2022 Comorbid Sleep Apnea, Arrhythmia, Congestive Heart Failure, Hypertension, Weeks Of Treatment: 10 History: Peripheral Arterial Disease, Type II Diabetes Clustered Wound: No Photos Wound Measurements Length: (cm) 1.2 Width: (cm) 1.8 Depth: (cm) 0.2 Area: (cm) 1.696 Troost, Auguste [name] ([id number]) Volume: (cm) 0.339 % Reduction in Area: -670.9% % Reduction in Volume: -413.6% Tunneling: Yes Position (Darren'clock): 12 [id number] Page 8 of 9 Maximum Distance: (cm) 0.2 Undermining: No Wound Description Classification: Grade 2 Exudate Amount: Medium Exudate Type: Serosanguineous Exudate Color: red, brown Foul Odor After Cleansing: No Slough/Fibrino No Wound Bed Granulation Amount: Large (67-100%) Necrotic Amount: Small (1-33%) Periwound Skin Texture Texture Color No Abnormalities Noted: No No Abnormalities Noted: No Callus: Yes Atrophie Blanche: No Crepitus: No Cyanosis: No Excoriation: No Ecchymosis: No Induration: No Erythema: No Rash: No Hemosiderin Staining: No Scarring: No Mottled: No Pallor:  No Moisture Rubor: No No Abnormalities Noted: No Dry / Scaly: No Maceration: No Treatment Notes Wound #2 (Foot) Wound Laterality: Plantar, Left Cleanser Dakins Solution Peri-Wound Care Skin Prep Discharge Instruction: Use skin prep as directed Topical Primary Dressing KerraCel Ag Gelling Fiber Dressing, 4x5 in (silver alginate) Discharge Instruction: Apply silver alginate to wound bed as instructed Secondary Dressing Optifoam Non-Adhesive Dressing, 4x4 in Discharge Instruction: Apply over as [name] foam donut. Woven Gauze Sponges 2x2 in Discharge Instruction: Apply over primary dressing as directed. Secured With [employer], 4.5x3.1 (in/yd) Discharge Instruction: Secure with Kerlix as directed. 9M Medipore H Soft Cloth Surgical T ape, 4 x 10 (in/yd) Discharge Instruction: Secure with tape as directed. Compression Wrap Compression Stockings Add-Ons Electronic Signature(s) Signed: 09/25/2022 12:40:42 PM By: Thayer Dallas Entered By: Thayer Dallas on 09/24/2022 09:37:54 Darren [name] ([id number]) [id number] Page 9 of 9 -------------------------------------------------------------------------------- Vitals Details Patient Name: Date of Service: Darren [name] [facility] [name]. 09/24/2022 9:00 [name]: [id number] Patient Account Number: [id number] Date of Birth/Sex: Treating RN: Jul 12, 1951 (22 y.Darren. M) Primary Care Yeily Link: Richarda Blade Other Clinician: Referring Ruthie Berch: Treating Amand Lemoine/Extender: Geralyn Corwin Ngetich, [name] in Treatment: 10 Vital Signs Time Taken: 09:20 Temperature (F): 98 Height (in): 68 Pulse (bpm): 80 Weight (lbs): 185 Respiratory Rate (breaths/min): 18 Body Mass Index (BMI): 28.1 Blood Pressure (mmHg): 123/84 Reference Range: 80 - 120 mg / dl Electronic Signature(s) Signed: 09/25/2022 12:40:42 PM By: Thayer Dallas Entered By: Thayer Dallas on 09/24/2022 09:21:09

## 2022-09-26 ENCOUNTER — Other Ambulatory Visit: Payer: Self-pay | Admitting: Family

## 2022-09-28 ENCOUNTER — Ambulatory Visit: Payer: Medicare HMO | Attending: Cardiology

## 2022-09-28 DIAGNOSIS — I4891 Unspecified atrial fibrillation: Secondary | ICD-10-CM | POA: Diagnosis not present

## 2022-09-28 DIAGNOSIS — I513 Intracardiac thrombosis, not elsewhere classified: Secondary | ICD-10-CM

## 2022-09-28 DIAGNOSIS — Z5181 Encounter for therapeutic drug level monitoring: Secondary | ICD-10-CM | POA: Diagnosis not present

## 2022-09-28 LAB — POCT INR: INR: 2.3 (ref 2.0–3.0)

## 2022-09-28 NOTE — Patient Instructions (Addendum)
Continue 1/2 tablet daily except for 1 tablet on Mondays, Wednesdays, and Fridays. Recheck INR 3 weeks. [facility] [phone].

## 2022-09-29 ENCOUNTER — Encounter (HOSPITAL_BASED_OUTPATIENT_CLINIC_OR_DEPARTMENT_OTHER): Payer: Medicare HMO | Admitting: Internal Medicine

## 2022-09-29 DIAGNOSIS — G4733 Obstructive sleep apnea (adult) (pediatric): Secondary | ICD-10-CM | POA: Diagnosis not present

## 2022-09-29 DIAGNOSIS — Z794 Long term (current) use of insulin: Secondary | ICD-10-CM | POA: Diagnosis not present

## 2022-09-29 DIAGNOSIS — E11621 Type 2 diabetes mellitus with foot ulcer: Secondary | ICD-10-CM | POA: Diagnosis not present

## 2022-09-29 DIAGNOSIS — E1140 Type 2 diabetes mellitus with diabetic neuropathy, unspecified: Secondary | ICD-10-CM | POA: Diagnosis not present

## 2022-09-29 DIAGNOSIS — L97518 Non-pressure chronic ulcer of other part of right foot with other specified severity: Secondary | ICD-10-CM | POA: Diagnosis not present

## 2022-09-29 DIAGNOSIS — L97813 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: Secondary | ICD-10-CM | POA: Diagnosis not present

## 2022-09-29 DIAGNOSIS — L97522 Non-pressure chronic ulcer of other part of left foot with fat layer exposed: Secondary | ICD-10-CM | POA: Diagnosis not present

## 2022-09-29 DIAGNOSIS — E1151 Type 2 diabetes mellitus with diabetic peripheral angiopathy without gangrene: Secondary | ICD-10-CM | POA: Diagnosis not present

## 2022-09-29 DIAGNOSIS — I5022 Chronic systolic (congestive) heart failure: Secondary | ICD-10-CM | POA: Diagnosis not present

## 2022-09-30 NOTE — Progress Notes (Signed)
Darren [name] [name] ([id number]) [id number] Page 1 of 9 Visit Report for 09/29/2022 Arrival Information Details Patient Name: Date of Service: Darren [name] [facility] [name]. 09/29/2022 1:45 PM Medical Record Number: [id number] Patient Account Number: [id number] Date of Birth/Sex: Treating RN: 1951-09-18 (71 y.Darren. Darren [name] Primary Care Dacota Ruben: Richarda Blade Other Clinician: Referring Mckenzey Parcell: Treating Tehani Mersman/Extender: Geralyn Corwin Ngetich, [name] in Treatment: 10 Visit Information History Since Last Visit Added or deleted any medications: No Patient Arrived: Darren [name] Any new allergies or adverse reactions: No Arrival Time: 14:05 Had [name] fall or experienced change in No Accompanied By: daughter activities of daily living that may affect Transfer Assistance: None risk of falls: Patient Identification Verified: Yes Signs or symptoms of abuse/neglect since last visito No Secondary Verification Process Completed: Yes Hospitalized since last visit: No Patient Requires Transmission-Based Precautions: No Implantable device outside of the clinic excluding No Patient Has Alerts: Yes cellular tissue based products placed in the center Patient Alerts: Patient on Blood Thinner since last visit: Has Dressing in Place as Prescribed: Yes Has Compression in Place as Prescribed: Yes Pain Present Now: No Electronic SignatureJacksons) Signed: 09/29/2022 3:44:28 PM By: Redmond Pulling RN, BSN Entered By: Redmond Pulling on 09/29/2022 14:07:43 -------------------------------------------------------------------------------- Encounter Discharge Information Details Patient Name: Date of Service: Darren [name] [facility] [name]. 09/29/2022 1:45 PM Medical Record Number: [id number] Patient Account Number: [id number] Date of Birth/Sex: Treating RN: 1951-07-05 (15 y.Darren. [name] Primary Care Aarica Wax: Richarda Blade Other Clinician: Referring Alzena Gerber: Treating Carson Meche/Extender:  Geralyn Corwin Ngetich, [name] in Treatment: 10 Encounter Discharge Information Items Post Procedure Vitals Discharge Condition: Stable Temperature (F): 97.9 Ambulatory Status: Cane Pulse (bpm): 137 Discharge Destination: Home Respiratory Rate (breaths/min): 20 Transportation: Private Auto Blood Pressure (mmHg): 165/108 Accompanied By: daughter Schedule Follow-up Appointment: Yes Clinical Summary of Care: Notes patient did not take his BP and heart medication. Electronic SignatureJacksons) Signed: 09/30/2022 10:43:44 AM By: Shawn Stall RN, BSN Entered By: Shawn Stall on 09/29/2022 15:50:31 [name]) [id number] Page 2 of 9 -------------------------------------------------------------------------------- Lower Extremity Assessment Details Patient Name: Date of Service: Darren [name] [facility] [name]. 09/29/2022 1:45 PM Medical Record Number: [id number] Patient Account Number: [id number] Date of Birth/Sex: Treating RN: December 17, 1950 (62 y.Darren. Darren [name] Primary Care Sylar Voong: Richarda Blade Other Clinician: Referring Malayah Demuro: Treating Axten Pascucci/Extender: Geralyn Corwin Ngetich, [name] in Treatment: 10 Edema Assessment Assessed: [Left: No] [Right: No] Edema: [Left: Yes] [Right: No] Calf Left: Right: Point of Measurement: 39 cm From Medial Instep 39 cm 36.3 cm Ankle Left: Right: Point of Measurement: 8 cm From Medial Instep 27 cm 24.3 cm Vascular Assessment Pulses: Dorsalis Pedis Palpable: [Right:Yes] Electronic SignatureJacksons) Signed: 09/29/2022 3:44:28 PM By: Redmond Pulling RN, BSN Entered By: Redmond Pulling on 09/29/2022 14:15:04 -------------------------------------------------------------------------------- Multi Wound Chart Details Patient Name: Date of Service: Darren [name] [facility] [name]. 09/29/2022 1:45 PM Medical Record Number: [id number] Patient Account Number: [id number] Date of Birth/Sex: Treating RN: [birth year]  (34 y.Darren. M) Primary Care Lyncoln Ledgerwood: Richarda Blade Other Clinician: Referring Curvin Hunger: Treating Yanira Tolsma/Extender: Geralyn Corwin Ngetich, [name] in Treatment: 10 Vital Signs HeightJacksonin): 68 PulseJacksonbpm): 137 WeightJacksonlbs): 185 Blood PressureJacksonmmHg): 165/108 Body Mass IndexJacksonBMI): 28.1 TemperatureJacksonF): 97.9 Respiratory RateJacksonbreaths/min): 18 [1:Photos:] [N/[name]:N/[name] [id number] Page 3 of 9] Right Achilles Left, Plantar Foot N/[name] Wound Location: Surgical Injury Gradually Appeared N/[name] Wounding Event: Diabetic Wound/Ulcer of the Lower Diabetic Wound/Ulcer of the Lower N/[name] Primary Etiology: Extremity Extremity Sleep Apnea, Arrhythmia, Congestive Sleep Apnea, Arrhythmia, Congestive N/[name] Comorbid History: Heart Failure, Hypertension,  Peripheral Heart Failure, Hypertension, Peripheral Arterial Disease, Type II Diabetes Arterial Disease, Type II Diabetes 07/16/2022 03/09/2022 N/[name] Date Acquired: 10 10 N/[name] Weeks of Treatment: Open Open N/[name] Wound Status: No No N/[name] Wound Recurrence: 5.4x1.5x0.2 1.7x1x0.2 N/[name] Measurements L x W x D (cm) 6.362 1.335 N/[name] [name] (cm) : rea 1.272 0.267 N/[name] Volume (cm) : 68.80% -506.80% N/[name] % Reduction in [name] rea: 87.50% -304.50% N/[name] % Reduction in Volume: 7 Starting Position 1 (Darren'clock): 10 Ending Position 1 (Darren'clock): 0.8 Maximum Distance 1 (cm): No Yes N/[name] Undermining: Grade 2 Grade 2 N/[name] Classification: Medium Medium N/[name] Exudate [name] mount: Serosanguineous Serosanguineous N/[name] Exudate Type: red, brown red, brown N/[name] Exudate Color: Large (67-100%) Large (67-100%) N/[name] Granulation [name] mount: Pink, Pale N/[name] N/[name] Granulation Quality: Small (1-33%) Small (1-33%) N/[name] Necrotic [name] mount: Fat Layer (Subcutaneous Tissue): Yes N/[name] N/[name] Exposed Structures: Large (67-100%) N/[name] N/[name] Epithelialization: Debridement - Excisional N/[name] N/[name] Debridement: Pre-procedure Verification/Time Out 14:30 N/[name] N/[name] Taken: Lidocaine 4% Topical Solution N/[name]  N/[name] Pain Control: Subcutaneous, Slough N/[name] N/[name] Tissue Debrided: Skin/Subcutaneous Tissue N/[name] N/[name] Level: 8.1 N/[name] N/[name] Debridement [name] (sq cm): rea Curette N/[name] N/[name] Instrument: Minimum N/[name] N/[name] Bleeding: Pressure N/[name] N/[name] Hemostasis [name] chieved: 0 N/[name] N/[name] Procedural Pain: 0 N/[name] N/[name] Post Procedural Pain: Procedure was tolerated well N/[name] N/[name] Debridement Treatment Response: 5.4x1.5x0.2 N/[name] N/[name] Post Debridement Measurements L x W x D (cm) 1.272 N/[name] N/[name] Post Debridement Volume: (cm) Excoriation: No Callus: Yes N/[name] Periwound Skin Texture: Induration: No Excoriation: No Callus: No Induration: No Crepitus: No Crepitus: No Rash: No Rash: No Scarring: No Scarring: No Maceration: No Maceration: No N/[name] Periwound Skin Moisture: Dry/Scaly: No Dry/Scaly: No Atrophie Blanche: No Atrophie Blanche: No N/[name] Periwound Skin Color: Cyanosis: No Cyanosis: No Ecchymosis: No Ecchymosis: No Erythema: No Erythema: No Hemosiderin Staining: No Hemosiderin Staining: No Mottled: No Mottled: No Pallor: No Pallor: No Rubor: No Rubor: No Cellular or Tissue Based Product N/[name] N/[name] Procedures Performed: Debridement Treatment Notes Electronic SignatureJacksons) Signed: 09/29/2022 3:46:12 PM By: Geralyn Corwin DO Entered By: Geralyn Corwin on 09/29/2022 14:47:33 -------------------------------------------------------------------------------- Multi-Disciplinary Care Plan Details Patient Name: Date of Service: Darren [name] [facility] [name]. 09/29/2022 1:45 PM Medical Record Number: [id number] Patient Account Number: [id number] Darren [name], Darren [name] ([id number]) [id number] Page 4 of 9 Date of Birth/Sex: Treating RN: 03-Apr-1951 (63 y.Darren. [name] Primary Care Marvell Stavola: Richarda Blade Other Clinician: Referring Ambrose Wile: Treating Kavina Cantave/Extender: Geralyn Corwin Ngetich, [name] in Treatment: 10 Active Inactive Nutrition Nursing Diagnoses: Potential for  alteratiion in Nutrition/Potential for imbalanced nutrition Goals: Patient/caregiver agrees to and verbalizes understanding of need to use nutritional supplements and/or vitamins as prescribed Date Initiated: 07/16/2022 Target Resolution Date: 10/23/2022 Goal Status: Active Patient/caregiver will maintain therapeutic glucose control Date Initiated: 07/16/2022 Target Resolution Date: 10/22/2022 Goal Status: Active Interventions: Assess HgA1c results as ordered upon admission and as needed Provide education on nutrition Treatment Activities: Education provided on Nutrition : 09/24/2022 Obtain HgA1c : 07/16/2022 Patient referred to Primary Care Physician for further nutritional evaluation : 07/16/2022 Notes: Pain, Acute or Chronic Nursing Diagnoses: Pain, acute or chronic: actual or potential Potential alteration in comfort, pain Goals: Patient will verbalize adequate pain control and receive pain control interventions during procedures as needed Date Initiated: 07/16/2022 Target Resolution Date: 10/22/2022 Goal Status: Active Interventions: Encourage patient to take pain medications as prescribed Provide education on pain management Reposition patient for comfort Treatment Activities: Administer pain control measures as ordered : 07/16/2022 Notes: Electronic SignatureJacksons) Signed: 09/30/2022 10:43:44 AM By: Elesa Hacker,  Yvonne Kendall RN, BSN Entered By: Shawn Stall on 09/29/2022 15:43:05 -------------------------------------------------------------------------------- Pain Assessment Details Patient Name: Date of Service: Darren [name] [facility] [name]. 09/29/2022 1:45 PM Medical Record Number: [id number] Patient Account Number: [id number] Date of Birth/Sex: Treating RN: Jun 12, 1951 (14 y.Darren. Darren [name] Primary Care Vershawn Westrup: Richarda Blade Other Clinician: Referring Willford Rabideau: Treating Kristie Bracewell/Extender: Jae Dire in Treatment: [address] Darren [name], Darren [name]  ([id number]) [id number] Page 5 of 9 Location of Pain Severity and Description of Pain Patient Has Paino No Site Locations Pain Management and Medication Current Pain Management: Electronic SignatureJacksons) Signed: 09/29/2022 3:44:28 PM By: Redmond Pulling RN, BSN Entered By: Redmond Pulling on 09/29/2022 14:08:33 -------------------------------------------------------------------------------- Patient/Caregiver Education Details Patient Name: Date of Service: Darren [name] [facility] [name]. 11/21/2023andnbsp1:45 PM Medical Record Number: [id number] Patient Account Number: [id number] Date of Birth/Gender: Treating RN: 1951-07-06 (33 y.Darren. [name] Primary Care Physician: Richarda Blade Other Clinician: Referring Physician: Treating Physician/Extender: Jae Dire in Treatment: 10 Education Assessment Education Provided To: Patient Education Topics Provided Pain: Handouts: [name] Guide to Pain Control Methods: Explain/Verbal Responses: Reinforcements needed Electronic SignatureJacksons) Signed: 09/30/2022 10:43:44 AM By: Shawn Stall RN, BSN Entered By: Shawn Stall on 09/29/2022 15:43:34 Wound Assessment Details -------------------------------------------------------------------------------- [name]) [id number] Page 6 of 9 Patient Name: Date of Service: Darren [name] [facility] [name]. 09/29/2022 1:45 PM Medical Record Number: [id number] Patient Account Number: [id number] Date of Birth/Sex: Treating RN: Mar 16, 1951 (75 y.Darren. Darren [name] Primary Care Merlin Ege: Richarda Blade Other Clinician: Referring Taliana Mersereau: Treating Robert Sunga/Extender: Geralyn Corwin Ngetich, [name] in Treatment: 10 Wound Status Wound Number: 1 Primary Diabetic Wound/Ulcer of the Lower Extremity Etiology: Wound Location: Right Achilles Wound Open Wounding Event: Surgical Injury Status: Date Acquired:  07/16/2022 Comorbid Sleep Apnea, Arrhythmia, Congestive Heart Failure, Hypertension, Weeks Of Treatment: 10 History: Peripheral Arterial Disease, Type II Diabetes Clustered Wound: No Photos Wound Measurements Length: (cm) 5.4 % Reduction in Area: 68.8% Width: (cm) 1.5 % Reduction in Volume: 87.5% Depth: (cm) 0.2 Epithelialization: Large (67-100%) Area: (cm) 6.362 Tunneling: No Volume: (cm) 1.272 Undermining: No Wound Description Classification: Grade 2 Foul Odor After Cleansing: No Exudate Amount: Medium Slough/Fibrino Yes Exudate Type: Serosanguineous Exudate Color: red, brown Wound Bed Granulation Amount: Large (67-100%) Exposed Structure Granulation Quality: Pink, Pale Fat Layer (Subcutaneous Tissue) Exposed: Yes Necrotic Amount: Small (1-33%) Necrotic Quality: Adherent Slough Periwound Skin Texture Texture Color No Abnormalities Noted: No No Abnormalities Noted: No Callus: No Atrophie Blanche: No Crepitus: No Cyanosis: No Excoriation: No Ecchymosis: No Induration: No Erythema: No Rash: No Hemosiderin Staining: No Scarring: No Mottled: No Pallor: No Moisture Rubor: No No Abnormalities Noted: No Dry / Scaly: No Maceration: No Treatment Notes Wound #1 (Achilles) Wound Laterality: Right Cleanser Soap and Water Discharge Instruction: May shower and wash wound with dial antibacterial soap and water prior to dressing change. Wound Cleanser Discharge Instruction: Cleanse the wound with wound cleanser prior to applying [name] clean dressing using gauze sponges, not tissue or cotton balls. Darren [name], Darren [name] ([id number]) [id number] Page 7 of 9 Peri-Wound Care Sween Lotion (Moisturizing lotion) Discharge Instruction: Apply moisturizing lotion as directed Topical Primary Dressing Grafix # 2 Discharge Instruction: secure with adaptic and steri strips Secondary Dressing ABD Pad, 5x9 Discharge Instruction: Apply over primary dressing as  directed. Woven Gauze Sponge, Non-Sterile 4x4 in Discharge Instruction: Apply over primary dressing as directed. Secured With Compression Wrap Kerlix Roll 4.5x3.1 (in/yd) Discharge Instruction: Apply Kerlix and Coban compression as directed. Coban Self-Adherent Wrap  4x5 (in/yd) Discharge Instruction: Apply over Kerlix as directed. Compression Stockings Add-Ons Electronic SignatureJacksons) Signed: 09/29/2022 3:44:28 PM By: Redmond Pulling RN, BSN Entered By: Redmond Pulling on 09/29/2022 14:22:59 -------------------------------------------------------------------------------- Wound Assessment Details Patient Name: Date of Service: Darren [name] [facility] [name]. 09/29/2022 1:45 PM Medical Record Number: [id number] Patient Account Number: [id number] Date of Birth/Sex: Treating RN: 24-Sep-1951 (62 y.Darren. Darren [name] Primary Care Sammy Douthitt: Richarda Blade Other Clinician: Referring Anastacio Bua: Treating Harolyn Cocker/Extender: Geralyn Corwin Ngetich, [name] in Treatment: 10 Wound Status Wound Number: 2 Primary Diabetic Wound/Ulcer of the Lower Extremity Etiology: Wound Location: Left, Plantar Foot Wound Open Wounding Event: Gradually Appeared Status: Date Acquired: 03/09/2022 Comorbid Sleep Apnea, Arrhythmia, Congestive Heart Failure, Hypertension, Weeks Of Treatment: 10 History: Peripheral Arterial Disease, Type II Diabetes Clustered Wound: No Photos Wound Measurements Length: (cm) 1.7 Width: (cm) 1 [place]itton, Zayin [name] ([id number]) Depth: (cm) 0.2 Area: (cm) 1.335 Volume: (cm) 0.267 % Reduction in Area: -506.8% % Reduction in Volume: -304.5% [id number] Page 8 of 9 Undermining: Yes Starting Position (Darren'clock): 7 Ending Position (Darren'clock): 10 Maximum Distance: (cm) 0.8 Wound Description Classification: Grade 2 Exudate Amount: Medium Exudate Type: Serosanguineous Exudate Color: red, brown Foul Odor After Cleansing: No Slough/Fibrino No Wound  Bed Granulation Amount: Large (67-100%) Necrotic Amount: Small (1-33%) Necrotic Quality: Adherent Slough Periwound Skin Texture Texture Color No Abnormalities Noted: No No Abnormalities Noted: No Callus: Yes Atrophie Blanche: No Crepitus: No Cyanosis: No Excoriation: No Ecchymosis: No Induration: No Erythema: No Rash: No Hemosiderin Staining: No Scarring: No Mottled: No Pallor: No Moisture Rubor: No No Abnormalities Noted: No Dry / Scaly: No Maceration: No Treatment Notes Wound #2 (Foot) Wound Laterality: Plantar, Left Cleanser Dakins Solution Peri-Wound Care Skin Prep Discharge Instruction: Use skin prep as directed Topical Primary Dressing Hydrofera Blue Ready Foam, 2.5 x2.5 in Discharge Instruction: Apply to wound bed as instructed MediHoney Gel, tube 1.5 (oz) Discharge Instruction: Apply to wound bed as instructed Secondary Dressing Optifoam Non-Adhesive Dressing, 4x4 in Discharge Instruction: Apply over as [name] foam donut. Woven Gauze Sponges 2x2 in Discharge Instruction: Apply over primary dressing as directed. Secured With [employer], 4.5x3.1 (in/yd) Discharge Instruction: Secure with Kerlix as directed. 53M Medipore H Soft Cloth Surgical T ape, 4 x 10 (in/yd) Discharge Instruction: Secure with tape as directed. Compression Wrap Compression Stockings Add-Ons Electronic SignatureJacksons) Signed: 09/29/2022 3:44:28 PM By: Redmond Pulling RN, BSN Entered By: Redmond Pulling on 09/29/2022 14:22:25 [name]) [id number] Page 9 of 9 -------------------------------------------------------------------------------- Vitals Details Patient Name: Date of Service: Darren [name] [facility] [name]. 09/29/2022 1:45 PM Medical Record Number: [id number] Patient Account Number: [id number] Date of Birth/Sex: Treating RN: 1950/12/13 (42 y.Darren. Darren [name] Primary Care Rayne Cowdrey: Richarda Blade Other Clinician: Referring  Timera Windt: Treating Bradyn Vassey/Extender: Geralyn Corwin Ngetich, [name] in Treatment: 10 Vital Signs Time Taken: 14:07 Temperature (F): 97.9 Height (in): 68 Pulse (bpm): 137 Weight (lbs): 185 Respiratory Rate (breaths/min): 18 Body Mass Index (BMI): 28.1 Blood Pressure (mmHg): 165/108 Reference Range: 80 - 120 mg / dl Electronic SignatureJacksons) Signed: 09/29/2022 3:44:28 PM By: Redmond Pulling RN, BSN Entered By: Redmond Pulling on 09/29/2022 14:08:23

## 2022-09-30 NOTE — Progress Notes (Signed)
LENG, MONTESDEOCA A ([id number]) [id number] Page 1 of 11 Visit Report for 09/29/2022 Chief Complaint Document Details Patient Name: Date of Service: [name] [facility] A. 09/29/2022 1:45 PM Medical Record Number: [id number] Patient Account Number: [id number] Date of Birth/Sex: Treating RN: 1951/07/20 (71 y.o. M) Primary Care Provider: [name] Other Clinician: Referring Provider: Treating Provider/Extender: [name], [name] in Treatment: 10 Information Obtained from: Patient Chief Complaint 07/16/2022; bilateral lower extremity wounds Electronic Signature(s) Signed: 09/29/2022 3:46:12 PM By: [name] DO Entered By: [name] on 09/29/2022 14:48:06 -------------------------------------------------------------------------------- Cellular or Tissue Based Product Details Patient Name: Date of Service: [name] [facility] A. 09/29/2022 1:45 PM Medical Record Number: [id number] Patient Account Number: [id number] Date of Birth/Sex: Treating RN: January 31, 1951 (71 y.o. [name] Primary Care Provider: [name] Other Clinician: Referring Provider: Treating Provider/Extender: [name] in Treatment: 10 Cellular or Tissue Based Product Type Wound #1 Right Achilles Applied to: Performed By: Physician [name], DO Cellular or Tissue Based Product Type: Grafix prime Level of Consciousness (Pre-procedure): Awake and Alert Pre-procedure Verification/Time Out Yes - 14:35 Taken: Location: trunk / arms / legs Wound Size (sq cm): 8.1 Product Size (sq cm): 6 Waste Size (sq cm): 0 Amount of Product Applied (sq cm): 6 Instrument Used: Forceps, Scissors Lot #: [id number] Order #: 2 Expiration Date: 11/22/2023 Fenestrated: No Reconstituted: Yes Solution Type: normal saline Solution Amount: 2m Lot #: [id number] Expiration Date: 03/08/2025 Secured: Yes Secured With:  Steri-Strips, adaptic Dressing Applied: No Procedural Pain: 0 Post Procedural Pain: 0 Response to Treatment: Procedure was tolerated well Level of Consciousness (Post- Awake and Alert procedure): ORIAAN, TOLEDOA ([id number] Page 2 of 11 Post Procedure Diagnosis Same as Pre-procedure Electronic Signature(s) Signed: 09/29/2022 3:46:12 PM By: HKalman ShanDO Signed: 09/30/2022 10:43:44 AM By: DDeon PillingRN, BSN Entered By: DDeon Pillingon 09/29/2022 14:44:14 -------------------------------------------------------------------------------- Debridement Details [facility] Name: [facility] 1[facility]45 PM Medical Record Number: [id number] Account Number: [id number] of Birth/Sex: Treating RN: [birth year](71y.o. MHessie DienerPrimary Care Provider: NMarlowe SaxOther Clinician: Referring Provider: Treating Provider/Extender: HKalman ShanNgetich, [name] in Treatment: 10 Debridement Performed for Assessment: Wound #1 Right Achilles Performed By: Physician HKalman Shan DO Debridement Type: Debridement Severity of Tissue Pre Debridement: Fat layer exposed Level of Consciousness (Pre-procedure): Awake and Alert Pre-procedure Verification/Time Out Yes - 14:30 Taken: Start Time: 14:31 Pain Control: Lidocaine 4% T opical Solution T Area Debrided (L x W): otal 5.4 (cm) x 1.5 (cm) = 8.1 (cm) Tissue and other material debrided: Viable, Non-Viable, Slough, Subcutaneous, Skin: Dermis , Skin: Epidermis, Slough Level: Skin/Subcutaneous Tissue Debridement Description: Excisional Instrument: Curette Bleeding: Minimum Hemostasis Achieved: Pressure End Time: 14:34 Procedural Pain: 0 Post Procedural Pain: 0 Response to Treatment: Procedure was tolerated well Level of Consciousness (Post- Awake and Alert procedure): Post Debridement Measurements of Total Wound Length: (cm) 5.4 Width:  (cm) 1.5 Depth: (cm) 0.2 Volume: (cm) 1.272 Character of Wound/Ulcer Post Debridement: Improved Severity of Tissue Post Debridement: Fat layer exposed Post Procedure Diagnosis Same as Pre-procedure Electronic Signature(s) Signed: 09/29/2022 3:46:12 PM By: HKalman ShanDO Signed: 09/30/2022 10:43:44 AM By: DDeon PillingRN, BSN Entered By: DDeon Pillingon 09/29/2022 14:41:20 HPI Details -------------------------------------------------------------------------------- [name]([id number] Page 3 of 11 Patient Name: Date of Service[name] 09/29/2022 1:45 PM Medical Record Number: [id number] Account Number: [id number] of Birth/Sex:  Treating RN: [birth year] (71 y.o. M) Primary Care Provider: [name] Other Clinician: Referring Provider: Treating Provider/Extender: [name], [name] in Treatment: 10 History of Present Illness HPI Description: Admission 07/16/2022 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 He is not sure how the wounds started. He has been following with podiatry for this issue. He had removal of the tendon to the right lower extremity by Dr. SBlenda Mounts He has been using Betadine to the wound beds. On 06/11/2022 he had a right posterior tibial artery angioplasty by Dr. CCarlis Abbott It was reported the patient is optimized after revascularization of the right lower extremity. His ABIs on the left were 0.91. He currently denies systemic signs of infection. He also does not wear shoes. He came in with Kerlix wrap  to his feet bilaterally. This did not completely cover his feet. 07/23/2022: This is a patient of Dr. HJodene Namthat she asked me to take a look at last week due to the significant involvement of the muscle and tendon on his right posterior leg wound. He needed an aggressive debridement and asked me if I would be able to perform this on her behalf. The patient is here today for that procedure. When his dressing was removed in clinic today, the wound was teeming with maggots. There is necrotic muscle, tendon, and fat, with a thick layer of slough on the wound. 9/21; patient presents for follow-up. He was debrided by Dr. CCeline Ahrat last clinic visit without any issues. He has been placing Dakin's wet-to-dry dressings to the right posterior wound. He has been using Medihoney to the left foot wound. He reports improvement in wound healing. He has no issues or complaints today. 9/28; patient with type 2 diabetes PAD status post revascularization. He underwent retrograde right PTA angioplasty. Last saw Dr. CCarlis Abbotton 9/12 and was felt to have a brisk posterior tibial signal at the right ankle. He had a pulsatile toe tracing that appeared adequate for healing. He has been using Santyl and Hydrofera Blue on the right Achilles area. He has a smaller area on the left fifth plantar metatarsal head They were apparently in the ER on 9/23 with swelling and discoloration above the wrap. They have a picture of the leg after the wrap was taken off which looks like that it was excessively tight superiorly 10/6; patient presents for follow-up. We have been using Santyl and Hydrofera Blue to the right lower extremity wound under Kerlix/Coban. T the left foot we o have been using silver alginate with Medihoney. He again comes in with no shoes. He has no issues or complaints today. 10/12; patient presents for follow-up. We have been using Santyl and Hydrofera Blue to the right lower extremity under Kerlix/Coban And silver  alginate to the left foot wound. He has no issues today. He denies signs of infection. 10/19; patient presents for follow-up. We continue to use Santyl and Hydrofera Blue to the right lower extremity under Kerlix/Coban and silver alginate to the left foot wound. There is been improvement in wound healing. 10/26; patient presents for follow-up. We have been using Santyl and Hydrofera Blue to the right lower extremity under Kerlix/Coban and  silver alginate to the left foot. There continues to be improvement in wound healing. Patient has no issues or complaints today. 11/2; the patient's area on the right Achilles heel looks quite healthy and is improved in terms of measurements. We have been using Hydrofera Blue. He is approved for Grafix On the left plantar foot wound over the fifth metatarsal head this tunnels over the lateral part of the met head. He is not offloading this at all 11/16; patient presents for follow-up. He has been using a surgical shoe with an offloading felt pad to the left lateral foot along with silver alginate. He has been approved for Grafix and this is available for placement today T the right lower extremity wound. Patient Is agreeable to this. We have been using Santyl and o Hydrofera Blue under Kerlix/Coban to this area. 11/21; patient presents for follow-up. We have been doing Grafix to the right lower extremity and silver alginate to the left foot wound. He has no issues or complaints today. Electronic Signature(s) Signed: 09/29/2022 3:46:12 PM By: [name] DO Entered By: [name] on 09/29/2022 14:49:00 -------------------------------------------------------------------------------- Physical Exam Details Patient Name: Date of Service: [name] [name] [facility] A. 09/29/2022 1:45 PM Medical Record Number: [id number] Patient Account Number: [id number] Date of Birth/Sex: Treating RN: 1951/03/30 (71 y.o. M) Primary Care Provider: [name] Other  Clinician: Referring Provider: Treating Provider/Extender: [name], [name] in Treatment: [address] ALDRICH, LLOYD A ([id number]) [id number] Page 4 of 11 respirations regular, non-labored and within target range for patient.. Cardiovascular 2+ dorsalis pedis/posterior tibialis pulses. Psychiatric pleasant and cooperative. Notes Right lower extremity: T the posterior aspect there is An open wound with granulation tissue and slough. Left lower extremity: T the plantar aspect of the fifth o o met head there is an open wound with granulation tissue Nonviable tissue. No signs of surrounding infection T any of the wound beds. Venous stasis o dermatitis and lymphedema skin changes bilaterally Electronic Signature(s) Signed: 09/29/2022 3:46:12 PM By: [name] DO Entered By: [name] on 09/29/2022 14:50:13 -------------------------------------------------------------------------------- Physician Orders Details Patient Name: Date of Service: [name] [facility] A. 09/29/2022 1:45 PM Medical Record Number: [id number] Patient Account Number: [id number] Date of Birth/Sex: Treating RN: 08-25-1951 (71 y.o. [name] Primary Care Provider: [name] Other Clinician: Referring Provider: Treating Provider/Extender: [name] Ngetich, [name] in Treatment: 10 Verbal / Phone Orders: No Diagnosis Coding Follow-up Appointments ppointment in 1 week. - Dr. Heber [place] on Tuesday Return A ppointment in 2 weeks. - w/ Dr. Heber [place] on Thursday Return A Other: - [facility]- will ship wound care supplies. Go get the x-ray on left foot. Anesthetic (In clinic) Topical Lidocaine 5% applied to wound bed Cellular or Tissue Based Products Cellular or Tissue Based Product Type: - Grafix approved-waiting to hear about copay or coinsurance-cost for you out of your pocket. Kerecsis denied by insurance. Grafix #1 applied on  09/24/22 Grafix #2 applied on 09/29/2022 Cellular or Tissue Based Product applied to wound bed, secured with steri-strips, cover with Adaptic or Mepitel. (DO NOT REMOVE). Bathing/ Shower/ Hygiene May shower with protection but do not get wound dressing(s) wet. Edema Control - Lymphedema / SCD / Other Elevate legs to the level of the heart or above for 30 minutes daily and/or when sitting, a frequency of: - 3-4 times a day throughout the day. Avoid standing for long periods of time. Off-Loading Open toe surgical shoe to: - ****put PegAssist in surgical shoe.**** Other: - minimize walking  and standing to aid in offloading pressure to wound. Prevalon Boot - left foot while resting in bed at night. Wound Treatment Wound #1 - Achilles Wound Laterality: Right Cleanser: Soap and Water 1 x Per Week/30 Days Discharge Instructions: May shower and wash wound with dial antibacterial soap and water prior to dressing change. Cleanser: Wound Cleanser 1 x Per Week/30 Days Discharge Instructions: Cleanse the wound with wound cleanser prior to applying a clean dressing using gauze sponges, not tissue or cotton balls. Peri-Wound Care: Sween Lotion (Moisturizing lotion) 1 x Per Week/30 Days Discharge Instructions: Apply moisturizing lotion as directed Prim Dressing: Grafix # 2 1 x Per Week/30 Days ary Discharge Instructions: secure with adaptic and steri strips AXAVIER, PRESSLEY A ([id number]) [id number] Page 5 of 11 Secondary Dressing: ABD Pad, 5x9 1 x Per Week/30 Days Discharge Instructions: Apply over primary dressing as directed. Secondary Dressing: Woven Gauze Sponge, Non-Sterile 4x4 in 1 x Per Week/30 Days Discharge Instructions: Apply over primary dressing as directed. Compression Wrap: Kerlix Roll 4.5x3.1 (in/yd) 1 x Per Week/30 Days Discharge Instructions: Apply Kerlix and Coban compression as directed. Compression Wrap: Coban Self-Adherent Wrap 4x5 (in/yd) 1 x Per  Week/30 Days Discharge Instructions: Apply over Kerlix as directed. Wound #2 - Foot Wound Laterality: Plantar, Left Cleanser: Dakins Solution 1 x Per Day/30 Days Peri-Wound Care: Skin Prep 1 x Per Day/30 Days Discharge Instructions: Use skin prep as directed Prim Dressing: Hydrofera Blue Ready Foam, 2.5 x2.5 in 1 x Per Day/30 Days ary Discharge Instructions: Apply to wound bed as instructed Prim Dressing: MediHoney Gel, tube 1.5 (oz) 1 x Per Day/30 Days ary Discharge Instructions: Apply to wound bed as instructed Secondary Dressing: Optifoam Non-Adhesive Dressing, 4x4 in 1 x Per Day/30 Days Discharge Instructions: Apply over as a foam donut. Secondary Dressing: Woven Gauze Sponges 2x2 in 1 x Per Day/30 Days Discharge Instructions: Apply over primary dressing as directed. Secured With: [employer], 4.5x3.1 (in/yd) 1 x Per Day/30 Days Discharge Instructions: Secure with Kerlix as directed. Secured With: 63M Medipore H Soft Cloth Surgical T ape, 4 x 10 (in/yd) 1 x Per Day/30 Days Discharge Instructions: Secure with tape as directed. Radiology X-ray, foot left - x-ray of left foot looking for infection related to non healing diabetic ulcer. ICD 10 code E11.621 CPT code Electronic Signature(s) Signed: 09/29/2022 3:46:12 PM By: [name] DO Entered By: [name] on 09/29/2022 14:50:24 Prescription 09/29/2022 -------------------------------------------------------------------------------- [name]. [name] DO Patient Name: Provider: 1951-06-15 [id number] Date of Birth: NPI#: Jerilynn Mages [id number] Sex: DEA #: [phone] Phone #: License #: [place] Patient Address: [address], [place] [address] [place], [place] [address] [phone] Allergies No Known Drug Allergies Provider's Orders X-ray, foot left - x-ray of left foot looking for infection related to non healing  diabetic ulcer. ICD 10 code E11.621 CPT code ASKARI, KINLEY ([id number]) [id number] Page 6 of 11 Hand Signature: Date(s): Electronic Signature(s) Signed: 09/29/2022 3:46:12 PM By: [name] DO Entered By: [name] on 09/29/2022 14:50:25 -------------------------------------------------------------------------------- Problem List Details Patient Name: Date of Service: [name] [facility] A. 09/29/2022 1:45 PM Medical Record Number: [id number] Patient Account Number: [id number] Date of Birth/Sex: Treating RN: December 22, 1950 (71 y.o. M) Primary Care Provider: [name] Other Clinician: Referring Provider: Treating Provider/Extender: [name] Ngetich, [name] in Treatment: 10 Active Problems ICD-10 Encounter Code Description Active Date MDM Diagnosis L97.518 Non-pressure chronic ulcer of other part of  right foot with other specified 07/16/2022 No Yes severity L97.522 Non-pressure chronic ulcer of other part of left foot with fat layer exposed 07/16/2022 No Yes E11.621 Type 2 diabetes mellitus with foot ulcer 07/16/2022 No Yes I73.9 Peripheral vascular disease, unspecified 07/16/2022 No Yes 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 07/23/2022 No Yes muscle Inactive Problems Resolved Problems Electronic Signature(s) Signed: 09/29/2022 3:46:12 PM By: [name] DO Entered By: [name] on 09/29/2022 14:47:27 -------------------------------------------------------------------------------- Progress Note Details Patient Name: Date of Service: [name] [name] 09/29/2022 1:45 PM Medical Record Number: [id number] Patient Account Number: [id number] Date of Birth/Sex: Treating RN: [birth year] (71 y.o. M) Primary Care Provider: [name] Other Clinician: CHRISTHOPER, BUSBEE ([id number]) [id number] Page 7 of 11 Referring Provider: Treating  Provider/Extender: [name], [name] in Treatment: 10 Subjective Chief Complaint Information obtained from Patient 07/16/2022; bilateral lower extremity wounds History of Present Illness (HPI) Admission 07/16/2022 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 He is not sure how the wounds started. He has been following with podiatry for this issue. He had removal of the tendon to the right lower extremity by Dr. SBlenda Mounts He has been using Betadine to the wound beds. On 06/11/2022 he had a right posterior tibial artery angioplasty by Dr. CCarlis Abbott It was reported the patient is optimized after revascularization of the right lower extremity. His ABIs on the left were 0.91. He currently denies systemic signs of infection. He also does not wear shoes. He came in with Kerlix wrap to his feet bilaterally. This did not completely cover his feet. 07/23/2022: This is a patient of Dr. HJodene Namthat she asked me to take a look at last week due to the significant involvement of the muscle and tendon on his right posterior leg wound. He needed an aggressive debridement and asked me if I would be able to perform this on her behalf. The patient is here today for that procedure. When his dressing was removed in clinic today, the wound was teeming with maggots. There is necrotic muscle, tendon, and fat, with a thick layer of slough on the wound. 9/21; patient presents for follow-up. He was debrided by Dr. CCeline Ahrat last clinic visit without any issues. He has been placing Dakin's wet-to-dry dressings to the right posterior wound. He  has been using Medihoney to the left foot wound. He reports improvement in wound healing. He has no issues or complaints today. 9/28; patient with type 2 diabetes PAD status post revascularization. He underwent retrograde right PTA angioplasty. Last saw Dr. CCarlis Abbotton 9/12 and was felt to have a brisk posterior tibial signal at the right ankle. He had a pulsatile toe tracing that appeared adequate for healing. He has been using Santyl and Hydrofera Blue on the right Achilles area. oo He has a smaller area on the left fifth plantar metatarsal head They were apparently in the ER on 9/23 with swelling and discoloration above the wrap. They have a picture of the leg after the wrap was taken off which looks like that it was excessively tight superiorly 10/6; patient presents for follow-up. We have been  using Santyl and Hydrofera Blue to the right lower extremity wound under Kerlix/Coban. T the left foot we o have been using silver alginate with Medihoney. He again comes in with no shoes. He has no issues or complaints today. 10/12; patient presents for follow-up. We have been using Santyl and Hydrofera Blue to the right lower extremity under Kerlix/Coban And silver alginate to the left foot wound. He has no issues today. He denies signs of infection. 10/19; patient presents for follow-up. We continue to use Santyl and Hydrofera Blue to the right lower extremity under Kerlix/Coban and silver alginate to the left foot wound. There is been improvement in wound healing. 10/26; patient presents for follow-up. We have been using Santyl and Hydrofera Blue to the right lower extremity under Kerlix/Coban and silver alginate to the left foot. There continues to be improvement in wound healing. Patient has no issues or complaints today. 11/2; the patient's area on the right Achilles heel looks quite healthy and is improved in terms of measurements. We have been using Hydrofera Blue. He is approved for Grafix On  the left plantar foot wound over the fifth metatarsal head this tunnels over the lateral part of the met head. He is not offloading this at all 11/16; patient presents for follow-up. He has been using a surgical shoe with an offloading felt pad to the left lateral foot along with silver alginate. He has been approved for Grafix and this is available for placement today T the right lower extremity wound. Patient Is agreeable to this. We have been using Santyl and o Hydrofera Blue under Kerlix/Coban to this area. 11/21; patient presents for follow-up. We have been doing Grafix to the right lower extremity and silver alginate to the left foot wound. He has no issues or complaints today. Patient History Information obtained from Chart. Family History Diabetes - Mother, Heart Disease - Mother,Siblings. Social History Current every day smoker, Alcohol Use - Never, Drug Use - No History, Caffeine Use - Never. Medical History Respiratory Patient has history of Sleep Apnea - does not wear Cardiovascular Patient has history of Arrhythmia - A. Fib, Congestive Heart Failure - EF 25%, Hypertension, Peripheral Arterial Disease Denies history of Peripheral Venous Disease Endocrine Patient has history of Type II Diabetes Hospitalization/Surgery History - Angiogram 06/11/2022 Dr. Carlis Abbott [facility]. - cardioversion 11/06/2021, 2015, 2017. 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 Medical A Surgical History Notes nd Gastrointestinal Ascites Genitourinary stage III Kidney disease TAAVI, HOOSE A ([id number]) [id number] Page 8 of 11 Objective Constitutional respirations regular, non-labored and within target range for patient.. Vitals Time Taken: 2:07 PM, Height: 68 in, Weight: 185 lbs, BMI: 28.1, Temperature: 97.9 F, Pulse: 137 bpm, Respiratory Rate: 18 breaths/min, Blood Pressure: 165/108 mmHg. Cardiovascular 2+ dorsalis pedis/posterior tibialis pulses. Psychiatric pleasant and  cooperative. General Notes: Right lower extremity: T the posterior aspect there is An open wound with granulation tissue and slough. Left lower extremity: T the plantar o o aspect of the fifth met head there is an open wound with granulation tissue Nonviable tissue. No signs of surrounding infection T any of the wound beds. o Venous stasis dermatitis and lymphedema skin changes bilaterally Integumentary (Hair, Skin) Wound #1 status is Open. Original cause of wound was Surgical Injury. The date acquired was: 07/16/2022. The wound has been in treatment 10 weeks. The wound is located on the Right Achilles. The wound measures 5.4cm length x 1.5cm width x 0.2cm depth; 6.362cm^2 area and 1.272cm^3 volume. There is Fat Layer (Subcutaneous Tissue) exposed.  There is no tunneling or undermining noted. There is a medium amount of serosanguineous drainage noted. There is large (67-100%) pink, pale granulation within the wound bed. There is a small (1-33%) amount of necrotic tissue within the wound bed including Adherent Slough. The periwound skin appearance did not exhibit: Callus, Crepitus, Excoriation, Induration, Rash, Scarring, Dry/Scaly, Maceration, Atrophie Blanche, Cyanosis, Ecchymosis, Hemosiderin Staining, Mottled, Pallor, Rubor, Erythema. Wound #2 status is Open. Original cause of wound was Gradually Appeared. The date acquired was: 03/09/2022. The wound has been in treatment 10 weeks. The wound is located on the [place]. The wound measures 1.7cm length x 1cm width x 0.2cm depth; 1.335cm^2 area and 0.267cm^3 volume. There is undermining starting at 7:00 and ending at 10:00 with a maximum distance of 0.8cm. There is a medium amount of serosanguineous drainage noted. There is large (67-100%) granulation within the wound bed. There is a small (1-33%) amount of necrotic tissue within the wound bed including Adherent Slough. The periwound skin appearance exhibited: Callus. The periwound skin  appearance did not exhibit: Crepitus, Excoriation, Induration, Rash, Scarring, Dry/Scaly, Maceration, Atrophie Blanche, Cyanosis, Ecchymosis, Hemosiderin Staining, Mottled, Pallor, Rubor, Erythema. 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 I debrided nonviable tissue. Grafix #2 was placed in standard fashion today T this area. I o recommended continuing with Kerlix/Coban. The left lateral foot wound is stable however does not appear healthy. At this time I recommended switching the dressing to [facility] and Medihoney. I recommended an x-ray to this area to assure no bony abnormality suggesting osteomyelitis. If all looks well he would benefit from a cast at next clinic visit. This was discussed and patient was agreeable. Procedures Wound #1 Pre-procedure diagnosis of Wound #1 is a Diabetic Wound/Ulcer of the Lower Extremity located on the Right Achilles .Severity of Tissue Pre Debridement is: Fat layer exposed. There was a Excisional Skin/Subcutaneous Tissue Debridement with a total area of 8.1 sq cm performed by [name], DO. With the following instrument(s): Curette to remove Viable and Non-Viable tissue/material. Material removed includes Subcutaneous Tissue, Slough, Skin: Dermis, and Skin: Epidermis after achieving pain control using Lidocaine 4% Topical Solution. A time out was conducted at 14:30, prior to the start of the procedure. A Minimum amount of bleeding was controlled with Pressure. The procedure was tolerated well with a pain level of 0 throughout and a pain level of 0 following the procedure.  Post Debridement Measurements: 5.4cm length x 1.5cm width x 0.2cm depth; 1.272cm^3 volume. Character of Wound/Ulcer Post Debridement is improved. Severity of Tissue Post Debridement is: Fat layer exposed. Post procedure Diagnosis Wound #1: Same as Pre-Procedure Pre-procedure diagnosis of Wound #1 is a Diabetic Wound/Ulcer of the Lower Extremity located on the Right Achilles. A skin graft procedure using a bioengineered skin substitute/cellular or tissue based product was performed by [name], DO with the following instrument(s): Forceps and Scissors. Grafix prime was applied and secured with Steri-Strips and adaptic. 6 sq cm of product was utilized and 0 sq cm was wasted. Post Application, no dressing was applied. A Time Out was conducted at 14:35, prior to the start of the procedure. The procedure was tolerated well with a pain level  of 0 throughout and a pain level of 0 following the procedure. Post procedure Diagnosis Wound #1: Same as Pre-Procedure . LAMIN, CHANDLEY A ([id number]) [id number] Page 9 of 11 Plan Follow-up Appointments: Return Appointment in 1 week. - Dr. Heber [place] on Tuesday Return Appointment in 2 weeks. - w/ Dr. Heber [place] on Thursday Other: - [facility]- will ship wound care supplies. Go get the x-ray on left foot. Anesthetic: (In clinic) Topical Lidocaine 5% applied to wound bed Cellular or Tissue Based Products: Cellular or Tissue Based Product Type: - Grafix approved-waiting to hear about copay or coinsurance-cost for you out of your pocket. Kerecsis denied by insurance. Grafix #1 applied on 09/24/22 Grafix #2 applied on 09/29/2022 Cellular or Tissue Based Product applied to wound bed, secured with steri-strips, cover with Adaptic or Mepitel. (DO NOT REMOVE). Bathing/ Shower/ Hygiene: May shower with protection but do not get wound dressing(s) wet. Edema Control - Lymphedema / SCD / Other: Elevate legs to the level of the heart or above for  30 minutes daily and/or when sitting, a frequency of: - 3-4 times a day throughout the day. Avoid standing for long periods of time. Off-Loading: Open toe surgical shoe to: - ****put PegAssist in surgical shoe.**** Other: - minimize walking and standing to aid in offloading pressure to wound. Prevalon Boot - left foot while resting in bed at night. Radiology ordered were: X-ray, foot left - x-ray of left foot looking for infection related to non healing diabetic ulcer. ICD 10 code E11.621 CPT code WOUND #1: - Achilles Wound Laterality: Right Cleanser: Soap and Water 1 x Per Week/30 Days Discharge Instructions: May shower and wash wound with dial antibacterial soap and water prior to dressing change. Cleanser: Wound Cleanser 1 x Per Week/30 Days Discharge Instructions: Cleanse the wound with wound cleanser prior to applying a clean dressing using gauze sponges, not tissue or cotton balls. Peri-Wound Care: Sween Lotion (Moisturizing lotion) 1 x Per Week/30 Days Discharge Instructions: Apply moisturizing lotion as directed Prim Dressing: Grafix # 2 1 x Per Week/30 Days ary Discharge Instructions: secure with adaptic and steri strips Secondary Dressing: ABD Pad, 5x9 1 x Per Week/30 Days Discharge Instructions: Apply over primary dressing as directed. Secondary Dressing: Woven Gauze Sponge, Non-Sterile 4x4 in 1 x Per Week/30 Days Discharge Instructions: Apply over primary dressing as directed. Com pression Wrap: Kerlix Roll 4.5x3.1 (in/yd) 1 x Per Week/30 Days Discharge Instructions: Apply Kerlix and Coban compression as directed. Com pression Wrap: Coban Self-Adherent Wrap 4x5 (in/yd) 1 x Per Week/30 Days Discharge Instructions: Apply over Kerlix as directed. WOUND #2: - Foot Wound Laterality: Plantar, Left Cleanser: Dakins Solution 1 x Per Day/30 Days Peri-Wound Care: Skin Prep 1 x Per Day/30 Days Discharge Instructions: Use skin prep as directed Prim Dressing: Hydrofera Blue Ready Foam,  2.5 x2.5 in 1 x Per Day/30 Days ary Discharge Instructions: Apply to wound bed as instructed Prim Dressing: MediHoney Gel, tube 1.5 (oz) 1 x Per Day/30 Days ary Discharge Instructions: Apply to wound bed as instructed Secondary Dressing: Optifoam Non-Adhesive Dressing, 4x4 in 1 x Per Day/30 Days Discharge Instructions: Apply over as a foam donut. Secondary Dressing: Woven Gauze Sponges 2x2 in 1 x Per Day/30 Days Discharge Instructions: Apply over primary dressing as directed. Secured With: [employer], 4.5x3.1 (in/yd) 1 x Per Day/30 Days Discharge Instructions: Secure with Kerlix as directed. Secured With: 66M Medipore H Soft Cloth Surgical T ape, 4 x 10 (in/yd) 1 x Per Day/30 Days Discharge Instructions: Secure with tape as  directed. 1. In office sharp debridement to the right lower extremity with Grafix placed in standard fashion today 2. Kerlix/Cobanooright lower extremity 3. Medihoney and Hydrofera Blue to the left lateral foot 4. Left foot x-ray 5. Follow-up in 1 week Electronic Signature(s) Signed: 09/29/2022 3:46:12 PM By: [name] DO Entered By: [name] on 09/29/2022 14:55:56 HxROS Details -------------------------------------------------------------------------------- Cordie Grice ([id number]) [id number] Page 10 of 11 Patient Name: Date of Service: [name] [facility] A. 09/29/2022 1:45 PM Medical Record Number: [id number] Patient Account Number: [id number] Date of Birth/Sex: Treating RN: 11-Feb-1951 (71 y.o. M) Primary Care Provider: [name] Other Clinician: Referring Provider: Treating Provider/Extender: [name] Ngetich, [name] in Treatment: 10 Information Obtained From Chart Respiratory Medical History: Positive for: Sleep Apnea - does not wear Cardiovascular Medical History: Positive for: Arrhythmia - A. Fib; Congestive Heart Failure - EF 25%; Hypertension; Peripheral Arterial  Disease Negative for: Peripheral Venous Disease Gastrointestinal Medical History: Past Medical History Notes: Ascites Endocrine Medical History: Positive for: Type II Diabetes Time with diabetes: 6 years Treated with: Insulin Blood sugar tested every day: No Genitourinary Medical History: Past Medical History Notes: stage III Kidney disease Immunizations Pneumococcal Vaccine: Received Pneumococcal Vaccination: No Implantable Devices No devices added Hospitalization / Surgery History Type of Hospitalization/Surgery Angiogram 06/11/2022 Dr. 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 09/29/2022 Medical Record Number: [id number] Patient Account Number: [id number] MURTAZA, SHELL A ([id number]) [id number] Page 11 of 11 Date of Birth/Sex: Treating RN: [birth year] (71 y.o. 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 Facility Procedures : CPT4 Code: [id number] Description: V3748- Grafix PL 2X3 sq cm Modifier: Quantity: 6 : CPT4 Code: [id number] Description: 44920 - SKIN SUB GRAFT TRNK/ARM/LEG ICD-10 Diagnosis Description 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 m Modifier: uscle Quantity: 1 Physician Procedures : CPT4 Code Description Modifier [id number] - WC PHYS SKIN SUB GRAFT TRNK/ARM/LEG ICD-10 Diagnosis Description 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

## 2022-10-08 ENCOUNTER — Encounter (HOSPITAL_BASED_OUTPATIENT_CLINIC_OR_DEPARTMENT_OTHER): Payer: Medicare HMO | Admitting: Internal Medicine

## 2022-10-08 DIAGNOSIS — L97522 Non-pressure chronic ulcer of other part of left foot with fat layer exposed: Secondary | ICD-10-CM

## 2022-10-08 DIAGNOSIS — L97518 Non-pressure chronic ulcer of other part of right foot with other specified severity: Secondary | ICD-10-CM | POA: Diagnosis not present

## 2022-10-08 DIAGNOSIS — E1140 Type 2 diabetes mellitus with diabetic neuropathy, unspecified: Secondary | ICD-10-CM | POA: Diagnosis not present

## 2022-10-08 DIAGNOSIS — G4733 Obstructive sleep apnea (adult) (pediatric): Secondary | ICD-10-CM | POA: Diagnosis not present

## 2022-10-08 DIAGNOSIS — I739 Peripheral vascular disease, unspecified: Secondary | ICD-10-CM | POA: Diagnosis not present

## 2022-10-08 DIAGNOSIS — L97813 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: Secondary | ICD-10-CM | POA: Diagnosis not present

## 2022-10-08 DIAGNOSIS — E11621 Type 2 diabetes mellitus with foot ulcer: Secondary | ICD-10-CM

## 2022-10-08 DIAGNOSIS — I5022 Chronic systolic (congestive) heart failure: Secondary | ICD-10-CM | POA: Diagnosis not present

## 2022-10-08 DIAGNOSIS — Z794 Long term (current) use of insulin: Secondary | ICD-10-CM | POA: Diagnosis not present

## 2022-10-08 DIAGNOSIS — E1151 Type 2 diabetes mellitus with diabetic peripheral angiopathy without gangrene: Secondary | ICD-10-CM | POA: Diagnosis not present

## 2022-10-09 NOTE — Progress Notes (Signed)
[name] [name] ([id number]) [id number] Page 1 of 11 Visit Report for 10/08/2022 Chief Complaint Document Details Patient Name: Date of Service: [name] [name] [name]. 10/08/2022 9:00 [name]: [id number] Patient Account Number: [id number] Date of Birth/Sex: Treating RN: 03/25/1951 (71 y.Darren. M) Primary Care Provider: Marlowe [name] Other Clinician: Referring Provider: Treating Provider/Extender: [name], [name] in Treatment: 12 Information Obtained from: Patient Chief Complaint 07/16/2022; bilateral lower extremity wounds Electronic Signature(s) Signed: 10/08/2022 12:56:23 PM By: Darren Shan DO Entered By: [name] on 10/08/2022 10:10:52 -------------------------------------------------------------------------------- Cellular or Tissue Based Product Details Patient Name: Date of Service: [name] [name] [name]. 10/08/2022 9:00 [name]: [id number] Patient Account Number: [id number] Date of Birth/Sex: Treating RN: October 16, 1951 (71 y.Darren. [name] Primary Care Provider: Marlowe [name] Other Clinician: Referring Provider: Treating Provider/Extender: [name] in Treatment: 12 Cellular or Tissue Based Product Type Wound #1 Right Achilles Applied to: Performed By: Physician Darren Shan, DO Cellular or Tissue Based Product Type: Grafix prime Level of Consciousness (Pre-procedure): Awake and Alert Pre-procedure Verification/Time Out Yes - 09:49 Taken: Location: trunk / arms / legs Wound Size (sq cm): 2.7 Product Size (sq cm): 6 Waste Size (sq cm): 0 Amount of Product Applied (sq cm): 6 Instrument Used: Forceps, Scissors Lot #: [id number] Expiration Date: 02/03/2024 Fenestrated: No Reconstituted: Yes Solution Type: NS Solution Amount: 77m Lot #: [id number] Expiration Date: 03/08/2025 Secured: Yes Secured With: Steri-Strips Dressing  Applied: Yes Primary Dressing: adaptic Procedural Pain: 0 Post Procedural Pain: 0 Response to Treatment: Procedure was tolerated well Level of Consciousness (Post- Awake and Alert procedure): [name] ([id number] Page 2 of 11 Post Procedure Diagnosis Same as Pre-procedure Electronic Signature(s) Signed: 10/08/2022 12:56:23 PM By: HKalman ShanDO Signed: 10/08/2022 5:26:06 PM By: BRhae HammockRN Entered By: BRhae Hammockon 10/08/2022 10:15:16 -------------------------------------------------------------------------------- Debridement Details Patient Name: [name] 10/08/2022 9:00 [name]: [id number] Account Number: [id number] of Birth/Sex: Treating RN: [birth year](89y.Darren. MBurnadette Pop [name] Primary Care Provider: NMarlowe SaxOther Clinician: Referring Provider: Treating Provider/Extender: HKalman ShanNgetich, [name] in Treatment: 12 Debridement Performed for Assessment: Wound #1 Right Achilles Performed By: Physician HKalman Shan DO Debridement Type: Debridement Severity of Tissue Pre Debridement: Fat layer exposed Level of Consciousness (Pre-procedure): Awake and Alert Pre-procedure Verification/Time Out Yes - 09:48 Taken: Start Time: 09:48 Pain Control: Lidocaine T Area Debrided (L x W): otal 4.5 (cm) x 0.6 (cm) = 2.7 (cm) Tissue and other material debrided: Viable, Non-Viable, Slough, Subcutaneous, Slough Level: Skin/Subcutaneous Tissue Debridement Description: Excisional Instrument: Curette Bleeding: Minimum Hemostasis Achieved: Pressure End Time: 09:48 Procedural Pain: 0 Post Procedural Pain: 0 Response to Treatment: Procedure was tolerated well Level of Consciousness (Post- Awake and Alert procedure): Post Debridement Measurements of Total Wound Length: (cm) 4.5 Width: (cm) 0.6 Depth: (cm) 0.1 Volume: (cm)  0.212 Character of Wound/Ulcer Post Debridement: Improved Severity of Tissue Post Debridement: Fat layer exposed Post Procedure Diagnosis Same as Pre-procedure Electronic Signature(s) Signed: 10/08/2022 12:56:23 PM By: HKalman ShanDO Signed: 10/08/2022 5:26:06 PM By: BRhae HammockRN Entered By: BRhae Hammockon 10/08/2022 09:50:22 HPI Details -------------------------------------------------------------------------------- [name]([id number] Page 3 of 11 Patient Name: Date of Service: Darren DRO [facility]ol[facility]SP[name] LLCA. 10/08/2022 9:00 [name]: [id number] Account Number: [id number] of Birth/Sex: Treating RN: 106/16/1952(89 y.Darren. 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 He is not sure how the wounds started. He has been following with podiatry for this issue. He had removal of the tendon to the right lower extremity by [name] He has been using Betadine to the wound beds. On 06/11/2022 he had [name] right posterior tibial artery angioplasty by [name] It was reported the patient is optimized after revascularization of the right lower extremity. His ABIs on the left were 0.91. He currently denies systemic signs of infection. He also does not wear shoes. He came in with Kerlix wrap to his feet bilaterally. This did  not completely cover his feet. 07/23/2022: This is [name] patient of Dr. HJodene Namthat she asked me to take [name] look at last week due to the significant involvement of the muscle and tendon on his right posterior leg wound. He needed an aggressive debridement and asked me if I would be able to perform this on her behalf. The patient is here today for that procedure. When his dressing was removed in clinic today, the wound was teeming with maggots. There is necrotic muscle, tendon, and fat, with [name] thick layer of slough on the wound. 9/21; patient presents for follow-up. He was debrided by Dr. CCeline Ahrat last clinic visit without any issues. He has been placing Dakin's wet-to-dry dressings to the right posterior wound. He has been using Medihoney to the left foot wound. He reports improvement in wound healing. He has no issues or complaints today. 9/28; patient with type 2 diabetes PAD status post revascularization. He underwent retrograde right PTA angioplasty. Last saw Dr. CCarlis Abbotton 9/12 and was felt to have [name] brisk posterior tibial signal at the right ankle. He had [name] pulsatile toe tracing that appeared adequate for healing. He has been using Santyl and Hydrofera Blue on the right Achilles area. He has [name] smaller area on the left fifth plantar metatarsal head They were apparently in the ER on 9/23 with swelling and discoloration above the wrap. They have [name] picture of the leg after the wrap was taken off which looks like that it was excessively tight superiorly 10/6; patient presents for follow-up. We have been using Santyl and Hydrofera Blue to the right lower extremity wound under Kerlix/Coban. T the left foot we Darren have been using silver alginate with Medihoney. He again comes in with no shoes. He has no issues or complaints today. 10/12; patient presents for follow-up. We have been using Santyl and Hydrofera Blue to the right lower extremity under Kerlix/Coban And silver alginate to the left foot wound. He  has no issues today. He denies signs of infection. 10/19; patient presents for follow-up. We continue to use Santyl and Hydrofera Blue to the right lower extremity under Kerlix/Coban and silver alginate to the left foot wound. There is been improvement in wound healing. 10/26; patient presents for follow-up. We have been using Santyl and Hydrofera Blue to the right lower extremity under Kerlix/Coban and silver alginate to the left foot. There continues to  be improvement in wound healing. Patient has no issues or complaints today. 11/2; the patient's area on the right Achilles heel looks quite healthy and is improved in terms of measurements. We have been using Hydrofera Blue. He is approved for Grafix On the left plantar foot wound over the fifth metatarsal head this tunnels over the lateral part of the met head. He is not offloading this at all 11/16; patient presents for follow-up. He has been using [name] surgical shoe with an offloading felt pad to the left lateral foot along with silver alginate. He has been approved for Grafix and this is available for placement today T the right lower extremity wound. Patient Is agreeable to this. We have been using Santyl and Darren Hydrofera Blue under Kerlix/Coban to this area. 11/21; patient presents for follow-up. We have been doing Grafix to the right lower extremity and silver alginate to the left foot wound. He has no issues or complaints today. 11/30; patient presents for follow-up. We have been doing Grafix to the right lower extremity wound and Medihoney with Hydrofera Blue to the left lateral foot wound. He reports pain to the left foot wound today. He did not obtain his x-ray. Electronic Signature(s) Signed: 10/08/2022 12:56:23 PM By: Darren Shan DO Entered By: [name] on 10/08/2022 10:11:23 -------------------------------------------------------------------------------- Physical Exam Details Patient Name: Date of Service: [name]  [name] [name]. 10/08/2022 9:00 [name]: [id number] Patient Account Number: [id number] Date of Birth/Sex: Treating RN: 1951/08/16 (30 y.Darren. M) Primary Care Provider: Marlowe [name] Other Clinician: Referring Provider: Treating Provider/Extender: [name], [name] in Treatment: [address] [name] ([id number]) [id number] Page 4 of 11 Constitutional respirations regular, non-labored and within target range for patient.. Cardiovascular 2+ dorsalis pedis/posterior tibialis pulses. Psychiatric pleasant and cooperative. Notes Right lower extremity: T the posterior aspect there is An open wound with granulation tissue and slough. Left lower extremity: T the plantar aspect of the fifth Darren Darren met head there is an open wound with granulation tissue and nonviable tissue. Patient reports tenderness to palpation to this site. Venous stasis dermatitis and lymphedema skin changes bilaterally Electronic Signature(s) Signed: 10/08/2022 12:56:23 PM By: Darren Shan DO Entered By: [name] on 10/08/2022 10:12:16 -------------------------------------------------------------------------------- Physician Orders Details Patient Name: Date of Service: [name] [name] 10/08/2022 9:00 [name]: [id number] Patient Account Number: [id number] Date of Birth/Sex: Treating RN: 08/02/1951 (67 y.Darren. [name] Primary Care Provider: Marlowe [name] Other Clinician: Referring Provider: Treating Provider/Extender: [name] [name] [name] in Treatment: 12 Verbal / Phone Orders: No Diagnosis Coding Follow-up Appointments ppointment in 1 week. - w/ Dr. Heber Graham Return [name] Pick up your antibiotics ASAP Other: - [facility]- will ship wound care supplies. Go get the x-ray on left foot. Anesthetic (In clinic) Topical Lidocaine 5% applied to wound bed Cellular or Tissue Based Products Cellular or Tissue Based  Product Type: - Grafix approved-waiting to hear about copay or coinsurance-cost for you out of your pocket. Kerecsis denied by insurance. Grafix #1 applied on 09/24/22 Grafix #2 applied on 09/29/2022 Grafix # 3 applied on 10/07/22 Cellular or Tissue Based Product applied to wound bed, secured with steri-strips, cover with Adaptic or Mepitel. (DO NOT REMOVE). Bathing/ Shower/ Hygiene May shower with protection but do not get wound dressing(s) wet. Edema Control - Lymphedema / SCD / Other Elevate legs to the level of the heart or above for 30 minutes daily and/or when sitting, [name] frequency of: -  3-4 times [name] day throughout the day. Avoid standing for long periods of time. Off-Loading Open toe surgical shoe to: - ****put PegAssist in surgical shoe.**** Other: - minimize walking and standing to aid in offloading pressure to wound. Prevalon Boot - left foot while resting in bed at night. Wound Treatment Wound #1 - Achilles Wound Laterality: Right Cleanser: Soap and Water 1 x Per Week/30 Days Discharge Instructions: May shower and wash wound with dial antibacterial soap and water prior to dressing change. Cleanser: Wound Cleanser 1 x Per Week/30 Days Discharge Instructions: Cleanse the wound with wound cleanser prior to applying [name] clean dressing using gauze sponges, not tissue or cotton balls. Peri-Wound Care: Sween Lotion (Moisturizing lotion) 1 x Per Week/30 Days [name], [name] ([id number]) [id number] Page 5 of 11 Discharge Instructions: Apply moisturizing lotion as directed Prim Dressing: Grafix # 3 1 x Per Week/30 Days ary Discharge Instructions: secure with adaptic and steri strips Secondary Dressing: ABD Pad, 5x9 1 x Per Week/30 Days Discharge Instructions: Apply over primary dressing as directed. Secondary Dressing: Woven Gauze Sponge, Non-Sterile 4x4 in 1 x Per Week/30 Days Discharge Instructions: Apply over primary dressing as directed. Compression  Wrap: Kerlix Roll 4.5x3.1 (in/yd) 1 x Per Week/30 Days Discharge Instructions: Apply Kerlix and Coban compression as directed. Compression Wrap: Coban Self-Adherent Wrap 4x5 (in/yd) 1 x Per Week/30 Days Discharge Instructions: Apply over Kerlix as directed. Wound #2 - Foot Wound Laterality: Plantar, Left Cleanser: Dakins Solution 1 x Per Day/30 Days Peri-Wound Care: Skin Prep 1 x Per Day/30 Days Discharge Instructions: Use skin prep as directed Prim Dressing: Dakin's Solution 0.25%, 16 (oz) 1 x Per Day/30 Days ary Discharge Instructions: Moisten gauze with Dakin's solution Secondary Dressing: Optifoam Non-Adhesive Dressing, 4x4 in 1 x Per Day/30 Days Discharge Instructions: Apply over as [name] foam donut. Secondary Dressing: Woven Gauze Sponges 2x2 in 1 x Per Day/30 Days Discharge Instructions: Apply over primary dressing as directed. Secured With: [employer], 4.5x3.1 (in/yd) 1 x Per Day/30 Days Discharge Instructions: Secure with Kerlix as directed. Secured With: 76M Medipore H Soft Cloth Surgical T ape, 4 x 10 (in/yd) 1 x Per Day/30 Days Discharge Instructions: Secure with tape as directed. Patient Medications llergies: No Known Drug Allergies [name] Notifications Medication Indication Start End 10/08/2022 amoxicillin-pot clavulanate DOSE 1 - oral 875 mg-125 mg tablet - 1 tablet oral twice [name] day x 14 days 10/08/2022 doxycycline hyclate DOSE 1 - oral 100 mg tablet - 1 tablet oral twice [name] day x 14 days Electronic Signature(s) Signed: 10/08/2022 12:56:23 PM By: Darren Shan DO Previous Signature: 10/08/2022 9:58:44 AM Version By: Darren Shan DO Entered By: [name] on 10/08/2022 10:12:27 -------------------------------------------------------------------------------- Problem List Details Patient Name: Date of Service: [name] [name] 10/08/2022 9:00 [name]: [id number] Patient Account Number: [id number] Date of Birth/Sex: Treating  RN: 10-21-1951 (10 y.Darren. M) Primary Care Provider: Marlowe [name] Other Clinician: Referring Provider: Treating Provider/Extender: [name], [name] in Treatment: [address] TAICHI, REPKA [name] ([id number]) [id number] Page 6 of 11 ICD-10 Encounter Code Description Active Date MDM Diagnosis L97.518 Non-pressure chronic ulcer of other part of right foot with other specified 07/16/2022 No Yes severity L97.522 Non-pressure chronic ulcer of other part of left foot with fat layer exposed 07/16/2022 No Yes E11.621 Type 2 diabetes mellitus with foot ulcer 07/16/2022 No Yes I73.9 Peripheral vascular disease, unspecified 07/16/2022 No Yes 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  07/23/2022 No Yes muscle Inactive Problems Resolved Problems Electronic Signature(s) Signed: 10/08/2022 12:56:23 PM By: Darren Shan DO Entered By: [name] on 10/08/2022 10:10:35 -------------------------------------------------------------------------------- Progress Note Details Patient Name: Date of Service: [name] [name] 10/08/2022 9:00 [name]: [id number] Patient Account Number: [id number] Date of Birth/Sex: Treating RN: 02-07-1951 (7 y.Darren. M) Primary Care Provider: Marlowe [name] Other Clinician: Referring Provider: Treating Provider/Extender: [name], [name] in Treatment: 12 Subjective Chief Complaint Information obtained from Patient 07/16/2022; bilateral lower extremity wounds History of Present Illness (HPI) Admission 07/16/2022 Mr. 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 He is not sure how the wounds started. He has been following with podiatry for this issue. He had removal of the tendon to the right lower extremity by [name] He has been using Betadine to the wound beds. On 06/11/2022 he had [name] right posterior tibial artery angioplasty by [name] It was reported the patient is optimized after revascularization of the right lower extremity. His ABIs on the left were 0.91. He currently denies systemic signs of infection. He also does not wear shoes. He came in with Kerlix wrap to his feet bilaterally. This did not completely cover his feet. 07/23/2022: This is [name] patient of Dr. HJodene Namthat she asked me to take [name] look at last week due to the significant involvement of the muscle and tendon on his right posterior leg wound. He needed an aggressive debridement and asked me if I would be able to perform this on her behalf. The patient is here today for that procedure. When his dressing was removed in clinic today, the wound was teeming with maggots. There is necrotic muscle, tendon, and fat, with [name] thick layer of slough on the wound. 9/21; patient presents for follow-up. He was debrided by Dr. CCeline Ahrat last clinic visit without any issues. He has been placing Dakin's wet-to-dry dressings to the right posterior wound. He has been using Medihoney to the left foot wound. He reports improvement in wound healing. He has no issues or complaints today. 9/28; patient with type 2 diabetes PAD status post revascularization. He underwent retrograde right PTA angioplasty. Last saw Dr. CCarlis Abbotton 9/12 and was felt to have [name] brisk posterior tibial signal at the right ankle. He had [name] pulsatile toe tracing that appeared adequate for healing. He has been using Santyl and HJEMAR, PAULSENA ([id number] Page 7 of 11 Blue on the right Achilles  area. oo He has [name] smaller area on the left fifth plantar metatarsal head They were apparently in the ER on 9/23 with swelling and discoloration above the wrap. They have [name] picture of the leg after the wrap was taken off which looks like that it was excessively tight superiorly 10/6; patient presents for follow-up. We have been using Santyl and Hydrofera Blue to the right lower extremity wound under Kerlix/Coban. T the left foot we Darren have been using silver alginate with Medihoney. He again comes in with no shoes. He has no issues or complaints today. 10/12; patient presents for follow-up. We have been using Santyl and Hydrofera Blue to the right lower extremity  under Kerlix/Coban And silver alginate to the left foot wound. He has no issues today. He denies signs of infection. 10/19; patient presents for follow-up. We continue to use Santyl and Hydrofera Blue to the right lower extremity under Kerlix/Coban and silver alginate to the left foot wound. There is been improvement in wound healing. 10/26; patient presents for follow-up. We have been using Santyl and Hydrofera Blue to the right lower extremity under Kerlix/Coban and silver alginate to the left foot. There continues to be improvement in wound healing. Patient has no issues or complaints today. 11/2; the patient's area on the right Achilles heel looks quite healthy and is improved in terms of measurements. We have been using Hydrofera Blue. He is approved for Grafix On the left plantar foot wound over the fifth metatarsal head this tunnels over the lateral part of the met head. He is not offloading this at all 11/16; patient presents for follow-up. He has been using [name] surgical shoe with an offloading felt pad to the left lateral foot along with silver alginate. He has been approved for Grafix and this is available for placement today T the right lower extremity wound. Patient Is agreeable to this. We have been using Santyl and Darren Hydrofera  Blue under Kerlix/Coban to this area. 11/21; patient presents for follow-up. We have been doing Grafix to the right lower extremity and silver alginate to the left foot wound. He has no issues or complaints today. 11/30; patient presents for follow-up. We have been doing Grafix to the right lower extremity wound and Medihoney with Hydrofera Blue to the left lateral foot wound. He reports pain to the left foot wound today. He did not obtain his x-ray. Patient History Information obtained from Chart. Family History Diabetes - Mother, Heart Disease - Mother,Siblings. Social History Current every day smoker, Alcohol Use - Never, Drug Use - No History, Caffeine Use - Never. Medical History Respiratory Patient has history of Sleep Apnea - does not wear Cardiovascular Patient has history of Arrhythmia - [name]. Fib, Congestive Heart Failure - EF 25%, Hypertension, Peripheral Arterial Disease Denies history of Peripheral Venous Disease Endocrine Patient has history of Type II Diabetes Hospitalization/Surgery History - Angiogram 06/11/2022 Dr. Carlis [name] [facility]. - cardioversion 11/06/2021, 2015, 2017. 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 Medical [name] Surgical History Notes nd Gastrointestinal Ascites Genitourinary stage III Kidney disease Objective Constitutional respirations regular, non-labored and within target range for patient.. Vitals Time Taken: 9:30 AM, Height: 68 in, Weight: 185 lbs, BMI: 28.1, Temperature: 97.8 F, Pulse: 123 bpm, Respiratory Rate: 20 breaths/min, Blood Pressure: 156/94 mmHg. Cardiovascular 2+ dorsalis pedis/posterior tibialis pulses. Psychiatric pleasant and cooperative. General Notes: Right lower extremity: T the posterior aspect there is An open wound with granulation tissue and slough. Left lower extremity: T the plantar Darren Darren aspect of the fifth met head there is an open wound with granulation tissue and nonviable tissue. Patient reports tenderness to palpation to this site.  Venous stasis dermatitis and lymphedema skin changes bilaterally Integumentary (Hair, Skin) [name], [name] ([id number]) [id number].pdf Page 8 of 11 Wound #1 status is Open. Original cause of wound was Surgical Injury. The date acquired was: 07/16/2022. The wound has been in treatment 12 [name]. The wound is located on the Right Achilles. The wound measures 4.5cm length x 0.6cm width x 0.1cm depth; 2.121cm^2 area and 0.212cm^3 volume. There is Fat Layer (Subcutaneous Tissue) exposed. There is no tunneling or undermining noted. There is [name] medium amount of serosanguineous drainage noted. The wound margin  is distinct with the outline attached to the wound base. There is large (67-100%) pink, pale granulation within the wound bed. There is no necrotic tissue within the wound bed. The periwound skin appearance did not exhibit: Callus, Crepitus, Excoriation, Induration, Rash, Scarring, Dry/Scaly, Maceration, Atrophie Blanche, Cyanosis, Ecchymosis, Hemosiderin Staining, Mottled, Pallor, Rubor, Erythema. Wound #2 status is Open. Original cause of wound was Gradually Appeared. The date acquired was: 03/09/2022. The wound has been in treatment 12 [name]. The wound is located on the [place]. The wound measures 1.1cm length x 2.2cm width x 0.3cm depth; 1.901cm^2 area and 0.57cm^3 volume. There is bone, tendon, and Fat Layer (Subcutaneous Tissue) exposed. There is no tunneling noted, however, there is undermining starting at 8:00 and ending at 10:00 with [name] maximum distance of 1.1cm. There is [name] medium amount of serosanguineous drainage noted. The wound margin is distinct with the outline attached to the wound base. There is large (67-100%) granulation within the wound bed. There is [name] small (1-33%) amount of necrotic tissue within the wound bed including Adherent Slough. The periwound skin appearance exhibited: Callus. The periwound skin appearance did not exhibit: Crepitus,  Excoriation, Induration, Rash, Scarring, Dry/Scaly, Maceration, Atrophie Blanche, Cyanosis, Ecchymosis, Hemosiderin Staining, Mottled, Pallor, Rubor, Erythema. 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 I debrided nonviable tissue and Grafix was placed in standard fashion today. Continue compression therapy to this leg. The left lateral foot wound is deeper and there is pain on palpation. He is high risk for amputation as he has had multiple amputations already. I prescribed Augmentin and doxycycline. He is on warfarin and will need his INR checked. I recommend he call his primary care office today to discuss his current treatment plan. He expressed understanding. For now recommended Dakin's wet-to- dry dressings to this area Continue surgical shoe with peg assist. I recommended he aggressively offload this week especially. Recommended he obtain his x-ray of the left foot. This was ordered at last clinic visit. Procedures Wound #1 Pre-procedure diagnosis of Wound #1 is [name] Diabetic Wound/Ulcer of the Lower Extremity located on the Right Achilles .Severity of Tissue Pre Debridement is: Fat layer exposed. There was [name] Excisional Skin/Subcutaneous Tissue Debridement with [name] total area of 2.7 sq cm performed by Darren Shan, DO. With the following instrument(s): Curette to remove Viable and Non-Viable tissue/material. Material removed includes Subcutaneous Tissue and Slough and after achieving pain control using Lidocaine. No specimens were taken. [name] time out was conducted at  09:48, prior to the start of the procedure. [name] Minimum amount of bleeding was controlled with Pressure. The procedure was tolerated well with [name] pain level of 0 throughout and [name] pain level of 0 following the procedure. Post Debridement Measurements: 4.5cm length x 0.6cm width x 0.1cm depth; 0.212cm^3 volume. Character of Wound/Ulcer Post Debridement is improved. Severity of Tissue Post Debridement is: Fat layer exposed. Post procedure Diagnosis Wound #1: Same as Pre-Procedure Plan Follow-up Appointments: Return Appointment in 1 week. - w/ Dr. Lorin Mercy up your antibiotics ASAP Other: - [facility]- will ship wound care supplies. Go get the x-ray on left foot. Anesthetic: (In clinic) Topical Lidocaine  5% applied to wound bed Cellular or Tissue Based Products: Cellular or Tissue Based Product Type: - Grafix approved-waiting to hear about copay or coinsurance-cost for you out of your pocket. Kerecsis denied by insurance. Grafix #1 applied on 09/24/22 Grafix #2 applied on 09/29/2022 Grafix # 3 applied on 10/07/22 Cellular or Tissue Based Product applied to wound bed, secured with steri-strips, cover with Adaptic or Mepitel. (DO NOT REMOVE). Bathing/ Shower/ Hygiene: May shower with protection but do not get wound dressing(s) wet. Edema Control - Lymphedema / SCD / Other: Elevate legs to the level of the heart or above for 30 minutes daily and/or when sitting, [name] frequency of: - 3-4 times [name] day throughout the day. Avoid standing for long periods of time. Off-Loading: Open toe surgical shoe to: - ****put PegAssist in surgical shoe.**** Other: - minimize walking and standing to aid in offloading pressure to wound. Prevalon Boot - left foot while resting in bed at night. The following medication(s) was prescribed: amoxicillin-pot clavulanate oral 875 mg-125 mg tablet 1 1 tablet oral twice [name] day x 14 days starting 10/08/2022 doxycycline hyclate oral 100 mg tablet 1 1 tablet oral twice [name] day x 14 days  starting 10/08/2022 WOUND #1: - Achilles Wound Laterality: Right Cleanser: Soap and Water 1 x Per Week/30 Days Discharge Instructions: May shower and wash wound with dial antibacterial soap and water prior to dressing change. Cleanser: Wound Cleanser 1 x Per Week/30 Days Discharge Instructions: Cleanse the wound with wound cleanser prior to applying [name] clean dressing using gauze sponges, not tissue or cotton balls. Peri-Wound Care: Sween Lotion (Moisturizing lotion) 1 x Per Week/30 Days [name], [name] ([id number]) [id number] Page 9 of 11 Discharge Instructions: Apply moisturizing lotion as directed Prim Dressing: Grafix # 3 1 x Per Week/30 Days ary Discharge Instructions: secure with adaptic and steri strips Secondary Dressing: ABD Pad, 5x9 1 x Per Week/30 Days Discharge Instructions: Apply over primary dressing as directed. Secondary Dressing: Woven Gauze Sponge, Non-Sterile 4x4 in 1 x Per Week/30 Days Discharge Instructions: Apply over primary dressing as directed. Com pression Wrap: Kerlix Roll 4.5x3.1 (in/yd) 1 x Per Week/30 Days Discharge Instructions: Apply Kerlix and Coban compression as directed. Com pression Wrap: Coban Self-Adherent Wrap 4x5 (in/yd) 1 x Per Week/30 Days Discharge Instructions: Apply over Kerlix as directed. WOUND #2: - Foot Wound Laterality: Plantar, Left Cleanser: Dakins Solution 1 x Per Day/30 Days Peri-Wound Care: Skin Prep 1 x Per Day/30 Days Discharge Instructions: Use skin prep as directed Prim Dressing: Dakin's Solution 0.25%, 16 (oz) 1 x Per Day/30 Days ary Discharge Instructions: Moisten gauze with Dakin's solution Secondary Dressing: Optifoam Non-Adhesive Dressing, 4x4 in 1 x Per Day/30 Days Discharge Instructions: Apply over as [name] foam donut. Secondary Dressing: Woven Gauze Sponges 2x2 in 1 x Per Day/30 Days Discharge Instructions: Apply over primary dressing as directed. Secured With: [employer], 4.5x3.1  (in/yd) 1 x Per Day/30 Days Discharge Instructions: Secure with Kerlix as directed. Secured With: 9M Medipore H Soft Cloth Surgical T ape, 4 x 10 (in/yd) 1 x Per Day/30 Days Discharge Instructions: Secure with tape as directed. 1. Dakin's wet-to-dry dressings 2. Grafix placed in standard fashion 3. Augmentin and doxycycline 4. Kerlix/Coban 5. Aggressive offloadingoosurgical shoe with peg assist 6. Follow-up in 1 week Electronic Signature(s) Signed: 10/08/2022 12:56:23 PM By: Darren Shan DO Entered By: [name] on 10/08/2022 10:17:10 -------------------------------------------------------------------------------- HxROS Details Patient Name: Date of Service: [name] [name] 10/08/2022 9:00 [name]:  [id number] Patient Account Number: [id number] Date of Birth/Sex: Treating RN: [birth year] (77 y.Darren. M) Primary Care Provider: Marlowe [name] Other Clinician: Referring Provider: Treating Provider/Extender: [name] [name] [name] in Treatment: 12 Information Obtained From Chart Respiratory Medical History: Positive for: Sleep Apnea - does not wear Cardiovascular Medical History: Positive for: Arrhythmia - [name]. Fib; Congestive Heart Failure - EF 25%; Hypertension; Peripheral Arterial Disease Negative for: Peripheral Venous Disease Gastrointestinal Medical History: Past Medical History Notes: Ascites Endocrine Medical History: Positive for: Type II Diabetes [name], [name] ([id number]) [id number] Page 10 of 11 Time with diabetes: 6 years Treated with: Insulin Blood sugar tested every day: No Genitourinary Medical History: Past Medical History Notes: stage III Kidney disease Immunizations Pneumococcal Vaccine: Received Pneumococcal Vaccination: No Implantable Devices No devices added Hospitalization / Surgery History Type of Hospitalization/Surgery Angiogram 06/11/2022 Dr. 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 10/08/2022 Medical Record Number: [id number] Patient Account Number: [id number] Date of Birth/Sex: Treating RN: 1951/09/22 (71 y.Darren. 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 Facility Procedures : CPT4 Code: [id number] Description: 15379 - WOUND CARE VISIT-LEV 3 EST PT Modifier: Quantity: 1 : CPT4 Code: [id number] Description: W9295- Grafix PL 2X3 sq cm Modifier: Quantity: 6 : CPT4 Code: [id number] Description: 09643 - SKIN SUB GRAFT TRNK/ARM/LEG ICD-10 Diagnosis Description L97.518 Non-pressure chronic ulcer of other part of right foot with other specified seve Modifier: rity Quantity: 1 Physician Procedures : CPT4 Code Description Modifier DEMARQUES, PILZ [name] ([id number])  [id number] - WC PHYS LEVEL 4 - EST PT 1 ICD-10 Diagnosis Description L97.522 Non-pressure chronic ulcer of other part of left foot with fat layer  exposed E11.621 Type 2 diabetes mellitus with foot ulcer I73.9 Peripheral vascular disease, unspecified Quantity: .pdf Page 11 of 11 : [id number] 70340 - WC PHYS SKIN SUB GRAFT TRNK/ARM/LEG 1 ICD-10 Diagnosis Description L97.518 Non-pressure chronic ulcer of other part of right foot with other specified severity Quantity: Electronic Signature(s) Signed: 10/08/2022 12:56:23 PM By: Darren Shan DO Entered By: [name] on 10/08/2022 10:17:34

## 2022-10-14 ENCOUNTER — Ambulatory Visit (HOSPITAL_COMMUNITY)
Admission: RE | Admit: 2022-10-14 | Discharge: 2022-10-14 | Disposition: A | Discharge status: Home / Self Care | Payer: Medicare HMO | Source: Ambulatory Visit | Attending: Internal Medicine | Admitting: Internal Medicine

## 2022-10-14 ENCOUNTER — Other Ambulatory Visit (HOSPITAL_COMMUNITY): Payer: Self-pay | Admitting: Internal Medicine

## 2022-10-14 DIAGNOSIS — E11621 Type 2 diabetes mellitus with foot ulcer: Secondary | ICD-10-CM

## 2022-10-14 DIAGNOSIS — L97509 Non-pressure chronic ulcer of other part of unspecified foot with unspecified severity: Secondary | ICD-10-CM | POA: Diagnosis not present

## 2022-10-14 DIAGNOSIS — M869 Osteomyelitis, unspecified: Secondary | ICD-10-CM | POA: Insufficient documentation

## 2022-10-14 DIAGNOSIS — M7989 Other specified soft tissue disorders: Secondary | ICD-10-CM | POA: Diagnosis not present

## 2022-10-14 DIAGNOSIS — E1169 Type 2 diabetes mellitus with other specified complication: Secondary | ICD-10-CM | POA: Diagnosis not present

## 2022-10-16 ENCOUNTER — Encounter (HOSPITAL_BASED_OUTPATIENT_CLINIC_OR_DEPARTMENT_OTHER): Payer: Medicare HMO | Admitting: Internal Medicine

## 2022-10-19 ENCOUNTER — Encounter (HOSPITAL_BASED_OUTPATIENT_CLINIC_OR_DEPARTMENT_OTHER): Payer: Medicare HMO | Attending: Internal Medicine | Admitting: Internal Medicine

## 2022-10-19 DIAGNOSIS — I110 Hypertensive heart disease with heart failure: Secondary | ICD-10-CM | POA: Insufficient documentation

## 2022-10-19 DIAGNOSIS — L97518 Non-pressure chronic ulcer of other part of right foot with other specified severity: Secondary | ICD-10-CM | POA: Diagnosis not present

## 2022-10-19 DIAGNOSIS — E1151 Type 2 diabetes mellitus with diabetic peripheral angiopathy without gangrene: Secondary | ICD-10-CM | POA: Insufficient documentation

## 2022-10-19 DIAGNOSIS — Z794 Long term (current) use of insulin: Secondary | ICD-10-CM | POA: Insufficient documentation

## 2022-10-19 DIAGNOSIS — L97522 Non-pressure chronic ulcer of other part of left foot with fat layer exposed: Secondary | ICD-10-CM | POA: Diagnosis not present

## 2022-10-19 DIAGNOSIS — I5022 Chronic systolic (congestive) heart failure: Secondary | ICD-10-CM | POA: Insufficient documentation

## 2022-10-19 DIAGNOSIS — L97813 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: Secondary | ICD-10-CM | POA: Insufficient documentation

## 2022-10-19 DIAGNOSIS — E11621 Type 2 diabetes mellitus with foot ulcer: Secondary | ICD-10-CM | POA: Diagnosis not present

## 2022-10-19 DIAGNOSIS — G4733 Obstructive sleep apnea (adult) (pediatric): Secondary | ICD-10-CM | POA: Insufficient documentation

## 2022-10-19 DIAGNOSIS — E1140 Type 2 diabetes mellitus with diabetic neuropathy, unspecified: Secondary | ICD-10-CM | POA: Diagnosis not present

## 2022-10-19 DIAGNOSIS — I739 Peripheral vascular disease, unspecified: Secondary | ICD-10-CM | POA: Diagnosis not present

## 2022-10-21 ENCOUNTER — Ambulatory Visit: Payer: Medicare HMO | Attending: Cardiovascular Disease

## 2022-10-21 DIAGNOSIS — I4891 Unspecified atrial fibrillation: Secondary | ICD-10-CM | POA: Diagnosis not present

## 2022-10-21 DIAGNOSIS — Z5181 Encounter for therapeutic drug level monitoring: Secondary | ICD-10-CM

## 2022-10-21 LAB — POCT INR: INR: 2.9 (ref 2.0–3.0)

## 2022-10-21 NOTE — Patient Instructions (Signed)
Description   Continue 1/2 tablet daily except for 1 tablet on Mondays, Wednesdays, and Fridays.  Recheck INR 4 weeks.  [facility] [phone].

## 2022-10-22 NOTE — Progress Notes (Signed)
KENROY, VICENCIO [name] ([id number]) [id number] Page 1 of 12 Visit Report for 10/19/2022 Arrival Information Details Patient Name: Date of Service: [name] [name]. 10/19/2022 12:30 PM Medical Record Number: [id number] Patient Account Number: [id number] Date of Birth/Sex: Treating RN: 24-Oct-1951 (71 y.o. M) Primary Care Anisha Starliper: Richarda Blade Other Clinician: Referring Aeryn Medici: Treating Emilygrace Grothe/Extender: Geralyn Corwin Ngetich, [name] in Treatment: 13 Visit Information History Since Last Visit All ordered tests and consults were completed: No Patient Arrived: [name] Added or deleted any medications: No Arrival Time: 12:47 Any new allergies or adverse reactions: No Accompanied By: daughter Had [name] fall or experienced change in No Transfer Assistance: None activities of daily living that may affect Patient Identification Verified: Yes risk of falls: Secondary Verification Process Completed: Yes Signs or symptoms of abuse/neglect since last visito No Patient Requires Transmission-Based Precautions: No Hospitalized since last visit: No Patient Has Alerts: Yes Implantable device outside of the clinic excluding No Patient Alerts: Patient on Blood Thinner cellular tissue based products placed in the center since last visit: Has Compression in Place as Prescribed: Yes Pain Present Now: No Electronic Signature(s) Signed: 10/21/2022 12:00:46 PM By: Thayer Dallas Entered By: Thayer Dallas on 10/19/2022 12:48:44 -------------------------------------------------------------------------------- Clinic Level of Care Assessment Details Patient Name: Date of Service: [name] Darren Meyer [facility] [name]. 10/19/2022 12:30 PM Medical Record Number: [id number] Patient Account Number: [id number] Date of Birth/Sex: Treating RN: 26-Mar-1951 (71 y.o. [name] Primary Care Miana Politte: Richarda Blade Other Clinician: Referring Namrata Dangler: Treating Justinian Miano/Extender:  Geralyn Corwin Ngetich, [name] in Treatment: 13 Clinic Level of Care Assessment Items TOOL 4 Quantity Score X- 1 0 Use when only an EandM is performed on FOLLOW-UP visit ASSESSMENTS - Nursing Assessment / Reassessment X- 1 10 Reassessment of Co-morbidities (includes updates in patient status) X- 1 5 Reassessment of Adherence to Treatment Plan ASSESSMENTS - Wound and Skin [name] ssessment / Reassessment X - Simple Wound Assessment / Reassessment - one wound 1 5 []  - 0 Complex Wound Assessment / Reassessment - multiple wounds []  - 0 Dermatologic / Skin Assessment (not related to wound area) ASSESSMENTS - Focused Assessment X- 1 5 Circumferential Edema Measurements - multi extremities []  - 0 Nutritional Assessment / Counseling / Intervention DELMAS, [name] ([id number]) [id number] Page 2 of 12 []  - 0 Lower Extremity Assessment (monofilament, tuning fork, pulses) []  - 0 Peripheral Arterial Disease Assessment (using hand held doppler) ASSESSMENTS - Ostomy and/or Continence Assessment and Care []  - 0 Incontinence Assessment and Management []  - 0 Ostomy Care Assessment and Management (repouching, etc.) PROCESS - Coordination of Care []  - 0 Simple Patient / Family Education for ongoing care X- 1 20 Complex (extensive) Patient / Family Education for ongoing care X- 1 10 Staff obtains Chiropractor, Records, T Results / Process Orders est []  - 0 Staff telephones HHA, Nursing Homes / Clarify orders / etc []  - 0 Routine Transfer to another Facility (non-emergent condition) []  - 0 Routine Hospital Admission (non-emergent condition) []  - 0 New Admissions / Manufacturing engineer / Ordering NPWT Apligraf, etc. , []  - 0 Emergency Hospital Admission (emergent condition) []  - 0 Simple Discharge Coordination X- 1 15 Complex (extensive) Discharge Coordination PROCESS - Special Needs []  - 0 Pediatric / Minor Patient Management []  - 0 Isolation  Patient Management []  - 0 Hearing / Language / Visual special needs []  - 0 Assessment of Community assistance (transportation, D/C planning, etc.) []  - 0 Additional assistance / Altered mentation []  - 0 Support Surface(s) Assessment (bed, cushion,  seat, etc.) INTERVENTIONS - Wound Cleansing / Measurement X - Simple Wound Cleansing - one wound 1 5 []  - 0 Complex Wound Cleansing - multiple wounds X- 1 5 Wound Imaging (photographs - any number of wounds) []  - 0 Wound Tracing (instead of photographs) X- 1 5 Simple Wound Measurement - one wound []  - 0 Complex Wound Measurement - multiple wounds INTERVENTIONS - Wound Dressings []  - 0 Small Wound Dressing one or multiple wounds X- 1 15 Medium Wound Dressing one or multiple wounds []  - 0 Large Wound Dressing one or multiple wounds X- 1 5 Application of Medications - topical []  - 0 Application of Medications - injection INTERVENTIONS - Miscellaneous []  - 0 External ear exam []  - 0 Specimen Collection (cultures, biopsies, blood, body fluids, etc.) []  - 0 Specimen(s) / Culture(s) sent or taken to Lab for analysis []  - 0 Patient Transfer (multiple staff / / Similar devices) []  - 0 Simple Staple / Suture removal (25 or less) []  - 0 Complex Staple / Suture removal (26 or more) []  - 0 Hypo / Hyperglycemic Management (close monitor of Blood Glucose) KOVEN, BELINSKY [name] (  .pdf Page 3 of 12 []  - 0 Ankle / Brachial Index (ABI) - do not check if billed separately X- 1 5 Vital Signs Has the patient been seen at the hospital within the last three years: Yes Total Score: 110 Level Of Care: New/Established - Level 3 Electronic Signature(s) Signed: 10/22/2022 5:16:09 PM By: RN Entered By: on 10/19/2022 14:44:28 -------------------------------------------------------------------------------- Encounter Discharge Information Details Patient Name:  Date of Service: [name] [name]. 10/19/2022 12:30 PM Medical Record Number: Patient Account Number: Date of Birth/Sex: Treating RN: 01/29/1951 (71 y.o. [id number] Primary Care Harmani Neto: ) [id number] Other Clinician: Referring Sueellen Kayes: Treating Cruze Zingaro/Extender: Ngetich, [name] in Treatment: 58 Encounter Discharge Information Items Post Procedure Vitals Discharge Condition: Stable Temperature (F): 98.7 Ambulatory Status: Ambulatory Pulse (bpm): 74 Discharge Destination: Home Respiratory Rate (breaths/min): 17 Transportation: Private Auto Blood Pressure (mmHg): 120/80 Accompanied By: daughter Schedule Follow-up Appointment: Yes Clinical Summary of Care: Patient Declined Electronic Signature(s) Signed: 10/22/2022 5:16:09 PM By: Fonnie Mu RN Entered By: 14/09/2022 on 10/19/2022 14:45:32 -------------------------------------------------------------------------------- Lower Extremity Assessment Details Patient Name: Date of Service: [facility] [name]. 10/19/2022 12:30 PM Medical Record Number: [id number] Patient Account Number: 09/23/1951 Date of Birth/Sex: Treating RN: Oct 07, 1951 (71 y.o. M) Primary Care Freeland Pracht: Richarda Blade Other Clinician: Referring Shaheem Pichon: Treating Tallon Gertz/Extender: Geralyn Corwin Ngetich, [name] in Treatment: 13 Edema Assessment Assessed: [Left: No] [Right: No] Edema: [Left: No] [Right: No] Calf Left: Right: Point of Measurement: 39 cm From Medial Instep 38.5 cm 37 cm Ankle Left: Right: Point of Measurement: 8 cm From Medial Instep 26.2 cm 24.5 cm Electronic Signature(s) KENGO, STURGES [name] (12/16/2023Fonnie Mu.pdf Page 4 of 12 Signed: 10/21/2022 12:00:46 PM By: 14/09/2022 Entered By: [name] on 10/19/2022 13:08:42 -------------------------------------------------------------------------------- Multi Wound Chart  Details Patient Name: Date of Service: 14/09/2022 [id number] [facility] [name]. 10/19/2022 12:30 PM Medical Record Number: [id number] Patient Account Number: Richarda Blade Date of Birth/Sex: Treating RN: Nov 11, 1950 (71 y.o. M) Primary Care Azuri Bozard: [id number] Other Clinician: Referring Brodyn Depuy: Treating Daijanae Rafalski/Extender: ) [id number] Ngetich, [name] in Treatment: 13 Vital Signs Height(in): 68 Pulse(bpm): 139 Weight(lbs): 185 Blood Pressure(mmHg): 139/88 Body Mass Index(BMI): 28.1 Temperature(F): Respiratory Rate(breaths/min): 20 [1:Photos: No Photos] Right Achilles Right Achilles Left, Plantar Foot Wound Location: Surgical Injury Surgical Injury Gradually Appeared  Wounding Event: Diabetic Wound/Ulcer of the Lower Diabetic Wound/Ulcer of the Lower Diabetic Wound/Ulcer of the Lower Primary Etiology: Extremity Extremity Extremity Sleep Apnea, Arrhythmia, Congestive Sleep Apnea, Arrhythmia, Congestive Sleep Apnea, Arrhythmia, Congestive Comorbid History: Heart Failure, Hypertension, Peripheral Heart Failure, Hypertension, Peripheral Heart Failure, Hypertension, Peripheral Arterial Disease, Type II Diabetes Arterial Disease, Type II Diabetes Arterial Disease, Type II Diabetes 07/16/2022 07/16/2022 03/09/2022 Date Acquired: Weeks of Treatment: Open Open Open Wound Status: No No No Wound Recurrence: 4.5x1x0.1 4.5x1x0.1 1.5x1.3x0.3 Measurements L x W x D (cm) 3.534 3.534 1.532 [name] (cm) : rea 0.353 0.353 0.459 Volume (cm) : 82.70% 82.70% -596.40% % Reduction in [name] rea: 96.50% 96.50% -595.50% % Reduction in Volume: Grade 2 Grade 2 Grade 2 Classification: Medium Medium Medium Exudate [name] mount: Serosanguineous Serosanguineous Serosanguineous Exudate Type: red, brown red, brown red, brown Exudate Color: Distinct, outline attached N/[name] Distinct, outline attached Wound Margin: Large (67-100%) N/[name] Large (67-100%) Granulation [name] mount: Pink, Pale N/[name] N/[name] Granulation  Quality: None Present (0%) N/[name] Small (1-33%) Necrotic [name] mount: Fat Layer (Subcutaneous Tissue): Yes N/[name] Fat Layer (Subcutaneous Tissue): Yes Exposed Structures: Fascia: No Tendon: Yes Tendon: No Bone: Yes Muscle: No Fascia: No Joint: No Muscle: No Bone: No Joint: No Large (67-100%) N/[name] None Epithelialization: Debridement - Excisional Debridement - Excisional N/[name] Debridement: Pre-procedure Verification/Time Out 13:25 13:25 N/[name] Taken: Lidocaine Lidocaine N/[name] Pain Control: Subcutaneous, Slough Subcutaneous, Slough N/[name] Tissue Debrided: Skin/Subcutaneous Tissue Skin/Subcutaneous Tissue N/[name] Level: 4.5 4.5 N/[name] Debridement [name] (sq cm): rea Curette Curette N/[name] Instrument: Minimum Minimum N/[name] Bleeding: Pressure Pressure N/[name] Hemostasis [name] chieved: 0 0 N/[name] Procedural Pain: DEMTRIUS, ROUNDS [name] ([id number]) [id number] Page 5 of 12 0 0 N/[name] Post Procedural Pain: Procedure was tolerated well Procedure was tolerated well N/[name] Debridement Treatment Response: 4.5x1x0.1 4.5x1x0.1 N/[name] Post Debridement Measurements L x W x D (cm) 0.353 0.353 N/[name] Post Debridement Volume: (cm) Excoriation: No Callus: Yes Periwound Skin Texture: Induration: No Excoriation: No Callus: No Induration: No Crepitus: No Crepitus: No Rash: No Rash: No Scarring: No Scarring: No Maceration: No Maceration: No Periwound Skin Moisture: Dry/Scaly: No Dry/Scaly: No Atrophie Blanche: No Atrophie Blanche: No Periwound Skin Color: Cyanosis: No Cyanosis: No Ecchymosis: No Ecchymosis: No Erythema: No Erythema: No Hemosiderin Staining: No Hemosiderin Staining: No Mottled: No Mottled: No Pallor: No Pallor: No Rubor: No Rubor: No Cellular or Tissue Based Product Cellular or Tissue Based Product N/[name] Procedures Performed: Debridement Debridement Wound Number: 2 N/[name] N/[name] Photos: No Photos N/[name] N/[name] Left, Plantar Foot N/[name] N/[name] Wound Location: Gradually Appeared N/[name]  N/[name] Wounding Event: Diabetic Wound/Ulcer of the Lower N/[name] N/[name] Primary Etiology: Extremity Sleep Apnea, Arrhythmia, Congestive N/[name] N/[name] Comorbid History: Heart Failure, Hypertension, Peripheral Arterial Disease, Type II Diabetes 03/09/2022 N/[name] N/[name] Date [name] cquired: 13 N/[name] N/[name] Weeks of Treatment: Open N/[name] N/[name] Wound Status: No N/[name] N/[name] Wound Recurrence: 1.5x1.3x0.3 N/[name] N/[name] Measurements L x W x D (cm) 1.532 N/[name] N/[name] [name] (cm) : rea 0.459 N/[name] N/[name] Volume (cm) : -596.40% N/[name] N/[name] % Reduction in [name] rea: -595.50% N/[name] N/[name] % Reduction in Volume: Grade 2 N/[name] N/[name] Classification: Medium N/[name] N/[name] Exudate [name] mount: Serosanguineous N/[name] N/[name] Exudate Type: red, brown N/[name] N/[name] Exudate Color: N/[name] N/[name] N/[name] Wound Margin: Medium (34-66%) N/[name] N/[name] Granulation [name] mount: Pink N/[name] N/[name] Granulation Quality: Medium (34-66%) N/[name] N/[name] Necrotic [name] mount: N/[name] N/[name] N/[name] Exposed Structures: None N/[name] N/[name] Epithelialization: N/[name] N/[name] N/[name] Debridement: N/[name] N/[name] N/[name] Pain Control: N/[name] N/[name] N/[name] Tissue Debrided: N/[name] N/[name] N/[name]  Level: N/[name] N/[name] N/[name] Debridement [name] (sq cm): rea N/[name] N/[name] N/[name] Instrument: N/[name] N/[name] N/[name] Bleeding: N/[name] N/[name] N/[name] Hemostasis [name] chieved: N/[name] N/[name] N/[name] Procedural Pain: N/[name] N/[name] N/[name] Post Procedural Pain: Debridement Treatment Response: N/[name] N/[name] N/[name] Post Debridement Measurements L x N/[name] N/[name] N/[name] W x D (cm) N/[name] N/[name] N/[name] Post Debridement Volume: (cm) Excoriation: No N/[name] N/[name] Periwound Skin Texture: Induration: No Callus: No Crepitus: No Rash: No Scarring: No Maceration: Yes N/[name] N/[name] Periwound Skin Moisture: Dry/Scaly: No Atrophie Blanche: No N/[name] N/[name] Periwound Skin Color: Cyanosis: No Ecchymosis: No Erythema: No Hemosiderin Staining: No Mottled: No Pallor: No Rubor: No N/[name] N/[name] N/[name] Procedures Performed: Treatment Notes ADALBERT, ALBERTO [name] ([id number]) [id number] Page 6 of 12 Electronic Signature(s) Signed: 10/19/2022 3:53:46 PM By: Geralyn Corwin DO Entered By:  Geralyn Corwin on 10/19/2022 13:33:17 -------------------------------------------------------------------------------- Multi-Disciplinary Care Plan Details Patient Name: Date of Service: [name] [facility]ren Meyer [facility] [name]. 10/19/2022 12:30 PM Medical Record Number: [id number] Patient Account Number: [id number] Date of Birth/Sex: Treating RN: 1951-03-09 (71 y.o. [name] Primary Care Jmari Pelc: Richarda Blade Other Clinician: Referring Elbie Statzer: Treating Leith Szafranski/Extender: Geralyn Corwin Ngetich, [name] in Treatment: 27 Active Inactive Nutrition Nursing Diagnoses: Potential for alteratiion in Nutrition/Potential for imbalanced nutrition Goals: Patient/caregiver agrees to and verbalizes understanding of need to use nutritional supplements and/or vitamins as prescribed Date Initiated: 07/16/2022 Target Resolution Date: 11/07/2022 Goal Status: Active Patient/caregiver will maintain therapeutic glucose control Date Initiated: 07/16/2022 Target Resolution Date: 11/07/2022 Goal Status: Active Interventions: Assess HgA1c results as ordered upon admission and as needed Provide education on nutrition Treatment Activities: Education provided on Nutrition : 10/08/2022 Obtain HgA1c : 07/16/2022 Patient referred to Primary Care Physician for further nutritional evaluation : 07/16/2022 Notes: Pain, Acute or Chronic Nursing Diagnoses: Pain, acute or chronic: actual or potential Potential alteration in comfort, pain Goals: Patient will verbalize adequate pain control and receive pain control interventions during procedures as needed Date Initiated: 07/16/2022 Target Resolution Date: 10/22/2022 Goal Status: Active Interventions: Encourage patient to take pain medications as prescribed Provide education on pain management Reposition patient for comfort Treatment Activities: Administer pain control measures as ordered : 07/16/2022 Notes: Electronic Signature(s) Signed: 10/22/2022 5:16:09 PM  By: Fonnie Mu RN Entered By: Fonnie Mu on 10/19/2022 13:07:13 [name] [name] ([id number]) [id number] Page 7 of 12 -------------------------------------------------------------------------------- Pain Assessment Details Patient Name: Date of Service: [name] Darren Meyer [facility] [name]. 10/19/2022 12:30 PM Medical Record Number: [id number] Patient Account Number: [id number] Date of Birth/Sex: Treating RN: 02-02-1951 (71 y.o. M) Primary Care Kimball Appleby: Richarda Blade Other Clinician: Referring Trevin Gartrell: Treating Garmon Dehn/Extender: Geralyn Corwin Ngetich, [name] in Treatment: 13 Active Problems Location of Pain Severity and Description of Pain Patient Has Paino No Site Locations Pain Management and Medication Current Pain Management: Electronic Signature(s) Signed: 10/21/2022 12:00:46 PM By: Thayer Dallas Entered By: Thayer Dallas on 10/19/2022 12:54:33 -------------------------------------------------------------------------------- Patient/Caregiver Education Details Patient Name: Date of Service: Criss [facility] [name]. 12/11/2023andnbsp12:30 PM Medical Record Number: [id number] Patient Account Number: [id number] Date of Birth/Gender: Treating RN: 1951-09-03 (71 y.o. [name] Primary Care Physician: Richarda Blade Other Clinician: Referring Physician: Treating Physician/Extender: Jae Dire in Treatment: 13 Education Assessment Education Provided To: Patient Education Topics Provided Nutrition: Methods: Explain/Verbal Responses: Reinforcements needed, State content correctly ENRIQUE, MANGANARO [name] ([id number]) [id number] Page 8 of 12 Electronic Signature(s) Signed: 10/22/2022 5:16:09 PM By: Fonnie Mu RN Entered By: Fonnie Mu on 10/19/2022 13:07:28 -------------------------------------------------------------------------------- Wound Assessment  Details Patient Name: Date of Service: [name] NEIC,  Alvino Chapel [facility] [name]. 10/19/2022 12:30 PM Medical Record Number: [id number] Patient Account Number: [id number] Date of Birth/Sex: Treating RN: 02-Apr-1951 (71 y.o. M) Primary Care Nyla Creason: Richarda Blade Other Clinician: Referring Dawna Jakes: Treating Tyyonna Soucy/Extender: Geralyn Corwin Ngetich, [name] in Treatment: 13 Wound Status Wound Number: 1 Primary Diabetic Wound/Ulcer of the Lower Extremity Etiology: Wound Location: Right Achilles Wound Open Wounding Event: Surgical Injury Status: Date Acquired: 07/16/2022 Comorbid Sleep Apnea, Arrhythmia, Congestive Heart Failure, Hypertension, Weeks Of Treatment: 13 History: Peripheral Arterial Disease, Type II Diabetes Clustered Wound: No Wound Measurements Length: (cm) 4.5 Width: (cm) 1 Depth: (cm) 0.1 Area: (cm) 3.534 Volume: (cm) 0.353 % Reduction in Area: 82.7% % Reduction in Volume: 96.5% Epithelialization: Large (67-100%) Tunneling: No Undermining: No Wound Description Classification: Grade 2 Wound Margin: Distinct, outline attached Exudate Amount: Medium Exudate Type: Serosanguineous Exudate Color: red, brown Foul Odor After Cleansing: No Slough/Fibrino No Wound Bed Granulation Amount: Large (67-100%) Exposed Structure Granulation Quality: Pink, Pale Fascia Exposed: No Necrotic Amount: None Present (0%) Fat Layer (Subcutaneous Tissue) Exposed: Yes Tendon Exposed: No Muscle Exposed: No Joint Exposed: No Bone Exposed: No Periwound Skin Texture Texture Color No Abnormalities Noted: No No Abnormalities Noted: No Callus: No Atrophie Blanche: No Crepitus: No Cyanosis: No Excoriation: No Ecchymosis: No Induration: No Erythema: No Rash: No Hemosiderin Staining: No Scarring: No Mottled: No Pallor: No Moisture Rubor: No No Abnormalities Noted: No Dry / Scaly: No Maceration: No Electronic Signature(s) Signed: 10/21/2022 12:00:46 PM By: Thayer Dallas Entered  By: Thayer Dallas on 10/19/2022 13:11:40 [name] [name] ([id number]) [id number] Page 9 of 12 -------------------------------------------------------------------------------- Wound Assessment Details Patient Name: Date of Service: [name] Darren Meyer [facility] [name]. 10/19/2022 12:30 PM Medical Record Number: [id number] Patient Account Number: [id number] Date of Birth/Sex: Treating RN: 1951-08-11 (71 y.o. M) Primary Care Cadel Stairs: Richarda Blade Other Clinician: Referring Itsel Opfer: Treating Doyle Kunath/Extender: Geralyn Corwin Ngetich, [name] in Treatment: 13 Wound Status Wound Number: 1 Primary Diabetic Wound/Ulcer of the Lower Extremity Etiology: Wound Location: Right Achilles Wound Open Wounding Event: Surgical Injury Status: Date Acquired: 07/16/2022 Comorbid Sleep Apnea, Arrhythmia, Congestive Heart Failure, Hypertension, Weeks Of Treatment: 13 History: Peripheral Arterial Disease, Type II Diabetes Clustered Wound: No Photos Wound Measurements Length: (cm) 4.5 Width: (cm) 1 Depth: (cm) 0.1 Area: (cm) 3.534 Volume: (cm) 0.353 % Reduction in Area: 82.7% % Reduction in Volume: 96.5% Wound Description Classification: Grade 2 Exudate Amount: Medium Exudate Type: Serosanguineous Exudate Color: red, brown Periwound Skin Texture Texture Color No Abnormalities Noted: No No Abnormalities Noted: No Moisture No Abnormalities Noted: No Treatment Notes Wound #1 (Achilles) Wound Laterality: Right Cleanser Peri-Wound Care Topical Primary Dressing Secondary Dressing Secured With Compression Wrap Compression Stockings DEONTRA, PEREYRA [name] ([id number]) [id number] Page 10 of 12 Add-Ons Electronic Signature(s) Signed: 10/21/2022 12:00:46 PM By: Thayer Dallas Entered By: Thayer Dallas on 10/19/2022 13:12:31 -------------------------------------------------------------------------------- Wound Assessment Details Patient Name:  Date of Service: [name] Darren Meyer [facility] [name]. 10/19/2022 12:30 PM Medical Record Number: [id number] Patient Account Number: [id number] Date of Birth/Sex: Treating RN: [birth year] (71 y.o. M) Primary Care Elaysha Bevard: Richarda Blade Other Clinician: Referring Jordain Radin: Treating Bao Bazen/Extender: Geralyn Corwin Ngetich, [name] in Treatment: 13 Wound Status Wound Number: 2 Primary Diabetic Wound/Ulcer of the Lower Extremity Etiology: Wound Location: Left, Plantar Foot Wound Open Wounding Event: Gradually Appeared Status: Date Acquired: 03/09/2022 Comorbid Sleep Apnea, Arrhythmia, Congestive Heart Failure, Hypertension, Weeks Of Treatment: 13 History: Peripheral Arterial Disease, Type II Diabetes Clustered Wound: No Photos Wound Measurements Length: (cm) 1.5 Width: (cm) 1.3 Depth: (cm) 0.3 Area: (  cm) 1.532 Volume: (cm) 0.459 % Reduction in Area: -596.4% % Reduction in Volume: -595.5% Epithelialization: None Wound Description Classification: Grade 2 Wound Margin: Distinct, outline attached Exudate Amount: Medium Exudate Type: Serosanguineous Exudate Color: red, brown Foul Odor After Cleansing: No Slough/Fibrino Yes Wound Bed Granulation Amount: Large (67-100%) Exposed Structure Necrotic Amount: Small (1-33%) Fascia Exposed: No Necrotic Quality: Adherent Slough Fat Layer (Subcutaneous Tissue) Exposed: Yes Tendon Exposed: Yes Muscle Exposed: No Joint Exposed: No Bone Exposed: Yes Periwound Skin Texture Texture Color No Abnormalities Noted: No No Abnormalities Noted: No Callus: Yes Atrophie Blanche: No Crepitus: No Cyanosis: No LAWERANCE, MATSUO [name] ([id number]) [id number] Page 11 of 12 Excoriation: No Ecchymosis: No Induration: No Erythema: No Rash: No Hemosiderin Staining: No Scarring: No Mottled: No Pallor: No Moisture Rubor: No No Abnormalities Noted: No Dry / Scaly: No Maceration: No Electronic Signature(s) Signed: 10/21/2022  12:00:46 PM By: Thayer Dallas Entered By: Thayer Dallas on 10/19/2022 13:10:42 -------------------------------------------------------------------------------- Wound Assessment Details Patient Name: Date of Service: [name] [name]. 10/19/2022 12:30 PM Medical Record Number: [id number] Patient Account Number: [id number] Date of Birth/Sex: Treating RN: 03-10-1951 (71 y.o. M) Primary Care Tali Cleaves: Richarda Blade Other Clinician: Referring Benjerman Molinelli: Treating Shawnae Leiva/Extender: Geralyn Corwin Ngetich, [name] in Treatment: 13 Wound Status Wound Number: 2 Primary Diabetic Wound/Ulcer of the Lower Extremity Etiology: Wound Location: Left, Plantar Foot Wound Open Wounding Event: Gradually Appeared Status: Date Acquired: 03/09/2022 Comorbid Sleep Apnea, Arrhythmia, Congestive Heart Failure, Hypertension, Weeks Of Treatment: 13 History: Peripheral Arterial Disease, Type II Diabetes Clustered Wound: No Wound Measurements Length: (cm) 1.5 Width: (cm) 1.3 Depth: (cm) 0.3 Area: (cm) 1.532 Volume: (cm) 0.459 % Reduction in Area: -596.4% % Reduction in Volume: -595.5% Epithelialization: None Tunneling: No Undermining: No Wound Description Classification: Grade 2 Exudate Amount: Medium Exudate Type: Serosanguineous Exudate Color: red, brown Wound Bed Granulation Amount: Medium (34-66%) Granulation Quality: Pink Necrotic Amount: Medium (34-66%) Necrotic Quality: Adherent Slough Periwound Skin Texture Texture Color No Abnormalities Noted: No No Abnormalities Noted: No Callus: No Atrophie Blanche: No Crepitus: No Cyanosis: No Excoriation: No Ecchymosis: No Induration: No Erythema: No Rash: No Hemosiderin Staining: No Scarring: No Mottled: No Pallor: No Moisture Rubor: No No Abnormalities Noted: No Dry / Scaly: No Maceration: Yes Treatment Notes KEIGHAN, AMEZCUA [name] ([id number]) [phone].pdf Page 12 of 12 Wound #2 (Foot) Wound  Laterality: Plantar, Left Cleanser Peri-Wound Care Topical Primary Dressing Secondary Dressing Secured With Compression Wrap Compression Stockings Add-Ons Electronic Signature(s) Signed: 10/21/2022 12:00:46 PM By: Thayer Dallas Entered By: Thayer Dallas on 10/19/2022 13:11:25 -------------------------------------------------------------------------------- Vitals Details Patient Name: Date of Service: [name] [name] 10/19/2022 12:30 PM Medical Record Number: [id number] Patient Account Number: [id number] Date of Birth/Sex: Treating RN: [birth year] (71 y.o. M) Primary Care Keya Wynes: Richarda Blade Other Clinician: Referring [place] Ke: Treating Gage Treiber/Extender: Geralyn Corwin Ngetich, [name] in Treatment: 13 Vital Signs Time Taken: 12:54 Pulse (bpm): 139 Height (in): 68 Respiratory Rate (breaths/min): 20 Weight (lbs): 185 Blood Pressure (mmHg): 139/88 Body Mass Index (BMI): 28.1 Reference Range: 80 - 120 mg / dl Electronic Signature(s) Signed: 10/21/2022 12:00:46 PM By: Thayer Dallas Entered By: Thayer Dallas on 10/19/2022 12:54:27

## 2022-10-22 NOTE — Progress Notes (Addendum)
[name] [name] ([id number]) [id number] Page 1 of 11 Visit Report for 10/19/2022 Chief Complaint Document Details Patient Name: Date of Service: [name] [name] [name]. 10/19/2022 12:30 PM Medical Record Number: [id number] Patient Account Number: [id number] Date of Birth/Sex: Treating RN: December 14, 1950 (71 y.Darren. M) Primary Care Provider: Marlowe [name] Other Clinician: Referring Provider: Treating Provider/Extender: [name], [name] in Treatment: 13 Information Obtained from: Patient Chief Complaint 07/16/2022; bilateral lower extremity wounds Electronic Signature(s) Signed: 10/19/2022 3:53:46 PM By: Darren Shan DO Entered By: [name] on 10/19/2022 13:33:58 -------------------------------------------------------------------------------- Cellular or Tissue Based Product Details Patient Name: Date of Service: [name] [facility] [name]. 10/19/2022 12:30 PM Medical Record Number: [id number] Patient Account Number: [id number] Date of Birth/Sex: Treating RN: 01-24-1951 (14 y.Darren. [name] Primary Care Provider: Marlowe [name] Other Clinician: Referring Provider: Treating Provider/Extender: [name] in Treatment: 13 Cellular or Tissue Based Product Type Wound #1 Right Achilles Applied to: Performed By: Physician Darren Shan, DO Cellular or Tissue Based Product Type: Grafix prime Level of Consciousness (Pre-procedure): Awake and Alert Pre-procedure Verification/Time Out Yes - 13:30 Taken: Location: trunk / arms / legs Wound Size (sq cm): 4.5 Product Size (sq cm): 6 Waste Size (sq cm): 0 Amount of Product Applied (sq cm): 6 Instrument Used: Forceps, Scissors Lot #: [id number] Expiration Date: 02/03/2024 Fenestrated: No Reconstituted: Yes Solution Type: normal saline Solution Amount: 57m Lot #: [id number] Expiration Date: 03/08/2025 Secured: Yes Secured With:  Steri-Strips Dressing Applied: Yes Primary Dressing: adaptic. gauze Procedural Pain: 0 Post Procedural Pain: 0 Response to Treatment: Procedure was tolerated well Level of Consciousness (Post- Awake and Alert procedure): [name] ([id number] Page 2 of 11 Post Procedure Diagnosis Same as Pre-procedure Electronic Signature(s) Signed: 10/19/2022 3:53:46 PM By: [name] Signed: 10/22/2022 5:16:09 PM By: [name] Entered By: Darren Hammockon 10/19/2022 13:32:31 -------------------------------------------------------------------------------- Debridement Details Patient Name: [facility] 10/19/2022 12:30 PM Medical Record Number: [id number] Account Number: [id number] of Birth/Sex: Treating RN: 101-27-1952(89y.Darren. MBurnadette Pop [name] Primary Care Provider: NMarlowe SaxOther Clinician: Referring Provider: Treating Provider/Extender: Darren ShanNgetich, [name] in Treatment: 13 Debridement Performed for Assessment: Wound #1 Right Achilles Performed By: Physician Darren Shan DO Debridement Type: Debridement Severity of Tissue Pre Debridement: Fat layer exposed Level of Consciousness (Pre-procedure): Awake and Alert Pre-procedure Verification/Time Out Yes - 13:25 Taken: Start Time: 13:25 Pain Control: Lidocaine T Area Debrided (L x W): otal 4.5 (cm) x 1 (cm) = 4.5 (cm) Tissue and other material debrided: Viable, Non-Viable, Slough, Subcutaneous, Slough Level: Skin/Subcutaneous Tissue Debridement Description: Excisional Instrument: Curette Bleeding: Minimum Hemostasis Achieved: Pressure End Time: 13:25 Procedural Pain: 0 Post Procedural Pain: 0 Response to Treatment: Procedure was tolerated well Level of Consciousness (Post- Awake and Alert procedure): Post Debridement Measurements of Total Wound Length: (cm) 4.5 Width: (cm) 1 Depth: (cm)  0.1 Volume: (cm) 0.353 Character of Wound/Ulcer Post Debridement: Improved Severity of Tissue Post Debridement: Fat layer exposed Post Procedure Diagnosis Same as Pre-procedure Electronic Signature(s) Signed: 10/19/2022 3:53:46 PM By: [name] Signed: 10/22/2022 5:16:09 PM By: [name] Entered By: Darren Hammockon 10/19/2022 13:25:34 HPI Details -------------------------------------------------------------------------------- [name]([id number]) [id number] Page 3 of 11 Patient Name: Date of Service: [name] [facility] 10/19/2022 12:30 PM Medical Record Number: [id number] Account Number: [id number] of Birth/Sex: Treating RN: 112-21-1952(89y.Darren. 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 He is not sure how the wounds started. He has been following with podiatry for this issue. He had removal of the tendon to the right lower extremity by [name] He has been using Betadine to the wound beds. On 06/11/2022 he had [name] right posterior tibial artery angioplasty by [name] It was reported the patient is optimized after revascularization of the right lower extremity. His ABIs on the left were 0.91. He currently denies systemic signs of infection. He also does not wear shoes. He came in with Kerlix wrap to his feet  bilaterally. This did not completely cover his feet. 07/23/2022: This is [name] patient of Dr. HJodene Namthat she asked me to take [name] look at last week due to the significant involvement of the muscle and tendon on his right posterior leg wound. He needed an aggressive debridement and asked me if I would be able to perform this on her behalf. The patient is here today for that procedure. When his dressing was removed in clinic today, the wound was teeming with maggots. There is necrotic muscle, tendon, and fat, with [name] thick layer of slough on the wound. 9/21; patient presents for follow-up. He was debrided by Dr. CCeline Ahrat last clinic visit without any issues. He has been placing Dakin's wet-to-dry dressings to the right posterior wound. He has been using Medihoney to the left foot wound. He reports improvement in wound healing. He has no issues or complaints today. 9/28; patient with type 2 diabetes PAD status post revascularization. He underwent retrograde right PTA angioplasty. Last saw Dr. CCarlis Abbotton 9/12 and was felt to have [name] brisk posterior tibial signal at the right ankle. He had [name] pulsatile toe tracing that appeared adequate for healing. He has been using Santyl and Hydrofera Blue on the right Achilles area. He has [name] smaller area on the left fifth plantar metatarsal head They were apparently in the ER on 9/23 with swelling and discoloration above the wrap. They have [name] picture of the leg after the wrap was taken off which looks like that it was excessively tight superiorly 10/6; patient presents for follow-up. We have been using Santyl and Hydrofera Blue to the right lower extremity wound under Kerlix/Coban. T the left foot we Darren have been using silver alginate with Medihoney. He again comes in with no shoes. He has no issues or complaints today. 10/12; patient presents for follow-up. We have been using Santyl and Hydrofera Blue to the right lower extremity under Kerlix/Coban And silver alginate to  the left foot wound. He has no issues today. He denies signs of infection. 10/19; patient presents for follow-up. We continue to use Santyl and Hydrofera Blue to the right lower extremity under Kerlix/Coban and silver alginate to the left foot wound. There is been improvement in wound healing. 10/26; patient presents for follow-up. We have been using Santyl and Hydrofera Blue to the right lower extremity under Kerlix/Coban and silver alginate to the left foot. There continues to be improvement  in wound healing. Patient has no issues or complaints today. 11/2; the patient's area on the right Achilles heel looks quite healthy and is improved in terms of measurements. We have been using Hydrofera Blue. He is approved for Grafix On the left plantar foot wound over the fifth metatarsal head this tunnels over the lateral part of the met head. He is not offloading this at all 11/16; patient presents for follow-up. He has been using [name] surgical shoe with an offloading felt pad to the left lateral foot along with silver alginate. He has been approved for Grafix and this is available for placement today T the right lower extremity wound. Patient Is agreeable to this. We have been using Santyl and Darren Hydrofera Blue under Kerlix/Coban to this area. 11/21; patient presents for follow-up. We have been doing Grafix to the right lower extremity and silver alginate to the left foot wound. He has no issues or complaints today. 11/30; patient presents for follow-up. We have been doing Grafix to the right lower extremity wound and Medihoney with Hydrofera Blue to the left lateral foot wound. He reports pain to the left foot wound today. He did not obtain his x-ray. 12/11; patient presents for follow-up. He has been using Dakin's wet-to-dry dressings to the left plantar foot wound. We have been doing Grafix to the right lower extremity wound under compression therapy. He obtained his x-ray that showed mild periosteal  elevation at the resection site with the possibility of osteomyelitis. He currently denies systemic signs of infection. He has been taking doxycycline and Augmentin without issues. He is getting his INR checked by his primary care office. Electronic Signature(s) Signed: 10/19/2022 3:53:46 PM By: Darren Shan DO Entered By: [name] on 10/19/2022 13:35:04 -------------------------------------------------------------------------------- Physical Exam Details Patient Name: Date of Service: [name] [facility] [name]. 10/19/2022 12:30 PM Medical Record Number: [id number] Patient Account Number: [id number] Date of Birth/Sex: Treating RN: 1951-01-02 (43 y.Darren. [name], Governor Rooks ([id number]) [id number] Page 4 of 11 Primary Care Provider: Marlowe [name] Other Clinician: Referring Provider: Treating Provider/Extender: [name] Ngetich, [name] in Treatment: 13 Constitutional respirations regular, non-labored and within target range for patient.. Cardiovascular 2+ dorsalis pedis/posterior tibialis pulses. Psychiatric pleasant and cooperative. Notes Right lower extremity: T the posterior aspect there is An open wound with granulation tissue and slough. Left lower extremity: T the plantar aspect of the fifth Darren Darren met head there is an open wound with granulation tissue and nonviable tissue. No purulent drainage. Venous stasis dermatitis and lymphedema skin changes bilaterally. Electronic Signature(s) Signed: 10/19/2022 3:53:46 PM By: Darren Shan DO Entered By: [name] on 10/19/2022 13:38:17 -------------------------------------------------------------------------------- Physician Orders Details Patient Name: Date of Service: [name] [facility] [name]. 10/19/2022 12:30 PM Medical Record Number: [id number] Patient Account Number: [id number] Date of Birth/Sex: Treating RN: [birth year] (49 y.Darren. [name] Primary Care Provider:  Marlowe [name] Other Clinician: Referring Provider: Treating Provider/Extender: [name] Ngetich, [name] in Treatment: 82 Verbal / Phone Orders: No Diagnosis Coding Follow-up Appointments ppointment in 1 week. - w/ Dr. Heber Hitchcock (appt. on Monday 12/18 @ 1230 Return [name] Pick up your antibiotics ASAP. We are extending your antibiotics. Other: - [facility]- will ship wound care supplies. Anesthetic (In clinic) Topical Lidocaine 5% applied to wound bed Cellular or Tissue Based Products Cellular or Tissue Based Product Type: - Grafix approved-waiting to hear about copay or coinsurance-cost for you out of your pocket. Kerecsis denied by insurance. Grafix #1 applied on 09/24/22 Grafix #2  applied on 09/29/2022 Grafix # 3 applied on 10/07/22 Grafix # 4 applied on 10/19/22 Cellular or Tissue Based Product applied to wound bed, secured with steri-strips, cover with Adaptic or Mepitel. (DO NOT REMOVE). Bathing/ Shower/ Hygiene May shower with protection but do not get wound dressing(s) wet. Edema Control - Lymphedema / SCD / Other Elevate legs to the level of the heart or above for 30 minutes daily and/or when sitting, [name] frequency of: - 3-4 times [name] day throughout the day. Avoid standing for long periods of time. Off-Loading Open toe surgical shoe to: - ****put PegAssist in surgical shoe.**** Other: - minimize walking and standing to aid in offloading pressure to wound. Prevalon Boot - left foot while resting in bed at night. Wound Treatment Wound #1 - Achilles Wound Laterality: Right Cleanser: Soap and Water 1 x Per Week/30 Days Discharge Instructions: May shower and wash wound with dial antibacterial soap and water prior to dressing change. [name], [name] ([id number]) [id number] Page 5 of 11 Cleanser: Wound Cleanser 1 x Per Week/30 Days Discharge Instructions: Cleanse the wound with wound cleanser prior to applying [name] clean dressing using gauze sponges,  not tissue or cotton balls. Peri-Wound Care: Sween Lotion (Moisturizing lotion) 1 x Per Week/30 Days Discharge Instructions: Apply moisturizing lotion as directed Prim Dressing: Grafix # 4 1 x Per Week/30 Days ary Discharge Instructions: secure with adaptic and steri strips Secondary Dressing: ABD Pad, 5x9 1 x Per Week/30 Days Discharge Instructions: Apply over primary dressing as directed. Secondary Dressing: Woven Gauze Sponge, Non-Sterile 4x4 in 1 x Per Week/30 Days Discharge Instructions: Apply over primary dressing as directed. Compression Wrap: Kerlix Roll 4.5x3.1 (in/yd) 1 x Per Week/30 Days Discharge Instructions: Apply Kerlix and Coban compression as directed. Compression Wrap: Coban Self-Adherent Wrap 4x5 (in/yd) 1 x Per Week/30 Days Discharge Instructions: Apply over Kerlix as directed. Wound #2 - Foot Wound Laterality: Plantar, Left Cleanser: Dakins Solution 1 x Per Day/30 Days Peri-Wound Care: Skin Prep 1 x Per Day/30 Days Discharge Instructions: Use skin prep as directed Prim Dressing: Dakin's Solution 0.25%, 16 (oz) 1 x Per Day/30 Days ary Discharge Instructions: Moisten gauze with Dakin's solution Secondary Dressing: Optifoam Non-Adhesive Dressing, 4x4 in 1 x Per Day/30 Days Discharge Instructions: Apply over as [name] foam donut. Secondary Dressing: Woven Gauze Sponges 2x2 in 1 x Per Day/30 Days Discharge Instructions: Apply over primary dressing as directed. Secured With: [employer], 4.5x3.1 (in/yd) 1 x Per Day/30 Days Discharge Instructions: Secure with Kerlix as directed. Secured With: 38M Medipore H Soft Cloth Surgical T ape, 4 x 10 (in/yd) 1 x Per Day/30 Days Discharge Instructions: Secure with tape as directed. Patient Medications llergies: No Known Drug Allergies [name] Notifications Medication Indication Start End 10/19/2022 amoxicillin-pot clavulanate DOSE 1 - oral 875 mg-125 mg tablet - 1 tablet oral twice [name] day x 14 days 10/19/2022 doxycycline  hyclate DOSE 1 - oral 100 mg tablet - 1 tablet oral twice [name] day x 14 days Electronic Signature(s) Signed: 10/19/2022 3:53:46 PM By: Darren Shan DO Previous Signature: 10/19/2022 2:03:29 PM Version By: Darren Shan DO Entered By: [name] on 10/19/2022 14:03:40 -------------------------------------------------------------------------------- Problem List Details Patient Name: Date of Service: [name] [facility] [name]. 10/19/2022 12:30 PM Medical Record Number: [id number] Patient Account Number: [id number] Date of Birth/Sex: Treating RN: September 08, 1951 (83 y.Darren. M) Primary Care Provider: Marlowe [name] Other Clinician: Referring Provider: Treating Provider/Extender: [name], [name] in Treatment: [address], [place] [name] ([id number]) [id number] Page 6 of 11  Active Problems ICD-10 Encounter Code Description Active Date MDM Diagnosis L97.518 Non-pressure chronic ulcer of other part of right foot with other specified 07/16/2022 No Yes severity L97.522 Non-pressure chronic ulcer of other part of left foot with fat layer exposed 07/16/2022 No Yes E11.621 Type 2 diabetes mellitus with foot ulcer 07/16/2022 No Yes I73.9 Peripheral vascular disease, unspecified 07/16/2022 No Yes 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 07/23/2022 No Yes muscle Inactive Problems Resolved Problems Electronic Signature(s) Signed: 10/19/2022 3:53:46 PM By: Darren Shan DO Entered By: [name] on 10/19/2022 13:33:11 -------------------------------------------------------------------------------- Progress Note Details Patient Name: Date of Service: [name] [name] 10/19/2022 12:30 PM Medical Record Number: [id number] Patient Account Number: [id number] Date of Birth/Sex: Treating RN: January 04, 1951 (4 y.Darren. M) Primary Care Provider: Marlowe [name] Other Clinician: Referring Provider: Treating  Provider/Extender: [name], [name] in Treatment: 13 Subjective Chief Complaint Information obtained from Patient 07/16/2022; bilateral lower extremity wounds History of Present Illness (HPI) Admission 07/16/2022 Mr. 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 He is not sure how the wounds started. He has been following with podiatry for this issue. He had removal of the tendon to the right lower extremity by [name] He has been using Betadine to the wound beds. On 06/11/2022 he had [name] right posterior tibial artery angioplasty by [name] It was reported the patient is optimized after revascularization of the right lower extremity. His ABIs on the left were 0.91. He currently denies systemic signs of infection. He also does not wear shoes. He came in with Kerlix wrap to his feet bilaterally. This did not completely cover his feet. 07/23/2022: This is [name] patient of Dr. HJodene Namthat she asked me to take [name] look at last week due to the significant involvement of the muscle and tendon on his right posterior leg wound. He needed an aggressive debridement and asked me if I would be able to perform this on her behalf. The patient is here today for that procedure. When his dressing was removed in clinic today, the wound was teeming with maggots. There is necrotic muscle, tendon, and fat, with [name] thick layer of slough on the wound. 9/21; patient presents for follow-up. He was debrided by Dr. CCeline Ahrat last clinic visit without any issues. He has been placing Dakin's wet-to-dry dressings to the right posterior wound. He  has been using Medihoney to the left foot wound. He reports improvement in wound healing. He has no issues or complaints [name], [name] ([id number] Page 7 of 11 today. 9/28; patient with type 2 diabetes PAD status post revascularization. He underwent retrograde right PTA angioplasty. Last saw Dr. CCarlis Abbotton 9/12 and was felt to have [name] brisk posterior tibial signal at the right ankle. He had [name] pulsatile toe tracing that appeared adequate for healing. He has been using Santyl and Hydrofera Blue on the right Achilles area. oo He has [name] smaller area on the left fifth plantar metatarsal head They were apparently in the ER on 9/23 with swelling and discoloration above the wrap. They have [name] picture of the leg after the wrap was taken  off which looks like that it was excessively tight superiorly 10/6; patient presents for follow-up. We have been using Santyl and Hydrofera Blue to the right lower extremity wound under Kerlix/Coban. T the left foot we Darren have been using silver alginate with Medihoney. He again comes in with no shoes. He has no issues or complaints today. 10/12; patient presents for follow-up. We have been using Santyl and Hydrofera Blue to the right lower extremity under Kerlix/Coban And silver alginate to the left foot wound. He has no issues today. He denies signs of infection. 10/19; patient presents for follow-up. We continue to use Santyl and Hydrofera Blue to the right lower extremity under Kerlix/Coban and silver alginate to the left foot wound. There is been improvement in wound healing. 10/26; patient presents for follow-up. We have been using Santyl and Hydrofera Blue to the right lower extremity under Kerlix/Coban and silver alginate to the left foot. There continues to be improvement in wound healing. Patient has no issues or complaints today. 11/2; the patient's area on the right Achilles heel looks quite healthy and is improved in terms  of measurements. We have been using Hydrofera Blue. He is approved for Grafix On the left plantar foot wound over the fifth metatarsal head this tunnels over the lateral part of the met head. He is not offloading this at all 11/16; patient presents for follow-up. He has been using [name] surgical shoe with an offloading felt pad to the left lateral foot along with silver alginate. He has been approved for Grafix and this is available for placement today T the right lower extremity wound. Patient Is agreeable to this. We have been using Santyl and Darren Hydrofera Blue under Kerlix/Coban to this area. 11/21; patient presents for follow-up. We have been doing Grafix to the right lower extremity and silver alginate to the left foot wound. He has no issues or complaints today. 11/30; patient presents for follow-up. We have been doing Grafix to the right lower extremity wound and Medihoney with Hydrofera Blue to the left lateral foot wound. He reports pain to the left foot wound today. He did not obtain his x-ray. 12/11; patient presents for follow-up. He has been using Dakin's wet-to-dry dressings to the left plantar foot wound. We have been doing Grafix to the right lower extremity wound under compression therapy. He obtained his x-ray that showed mild periosteal elevation at the resection site with the possibility of osteomyelitis. He currently denies systemic signs of infection. He has been taking doxycycline and Augmentin without issues. He is getting his INR checked by his primary care office. Patient History Information obtained from Chart. Family History Diabetes - Mother, Heart Disease - Mother,Siblings. Social History Current every day smoker, Alcohol Use - Never, Drug Use - No History, Caffeine Use - Never. Medical History Respiratory Patient has history of Sleep Apnea - does not wear Cardiovascular Patient has history of Arrhythmia - [name]. Fib, Congestive Heart Failure - EF 25%, Hypertension,  Peripheral Arterial Disease Denies history of Peripheral Venous Disease Endocrine Patient has history of Type II Diabetes Hospitalization/Surgery History - Angiogram 06/11/2022 Dr. Carlis [name] [facility]. - cardioversion 11/06/2021, 2015, 2017. 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 Medical [name] Surgical History Notes nd Gastrointestinal Ascites Genitourinary stage III Kidney disease Objective Constitutional respirations regular, non-labored and within target range for patient.. Vitals Time Taken: 12:54 PM, Height: 68 in, Weight: 185 lbs, BMI: 28.1, Pulse: 139 bpm, Respiratory Rate: 20 breaths/min, Blood Pressure: 139/88 mmHg. Cardiovascular 2+ dorsalis pedis/posterior tibialis pulses. Psychiatric  pleasant and cooperative. [name], [name] [name] ([id number]) [id number] Page 8 of 11 General Notes: Right lower extremity: T the posterior aspect there is An open wound with granulation tissue and slough. Left lower extremity: T the plantar Darren Darren aspect of the fifth met head there is an open wound with granulation tissue and nonviable tissue. No purulent drainage. Venous stasis dermatitis and lymphedema skin changes bilaterally. Integumentary (Hair, Skin) Wound #1 status is Open. Original cause of wound was Surgical Injury. The date acquired was: 07/16/2022. The wound has been in treatment 13 [name]. The wound is located on the Right Achilles. The wound measures 4.5cm length x 1cm width x 0.1cm depth; 3.534cm^2 area and 0.353cm^3 volume. There is Fat Layer (Subcutaneous Tissue) exposed. There is no tunneling or undermining noted. There is [name] medium amount of serosanguineous drainage noted. The wound margin is distinct with the outline attached to the wound base. There is large (67-100%) pink, pale granulation within the wound bed. There is no necrotic tissue within the wound bed. The periwound skin appearance did not exhibit: Callus, Crepitus, Excoriation, Induration, Rash, Scarring, Dry/Scaly,  Maceration, Atrophie Blanche, Cyanosis, Ecchymosis, Hemosiderin Staining, Mottled, Pallor, Rubor, Erythema. Wound #1 status is Open. Original cause of wound was Surgical Injury. The date acquired was: 07/16/2022. The wound has been in treatment 13 [name]. The wound is located on the Right Achilles. The wound measures 4.5cm length x 1cm width x 0.1cm depth; 3.534cm^2 area and 0.353cm^3 volume. There is [name] medium amount of serosanguineous drainage noted. Wound #2 status is Open. Original cause of wound was Gradually Appeared. The date acquired was: 03/09/2022. The wound has been in treatment 13 [name]. The wound is located on the [place]. The wound measures 1.5cm length x 1.3cm width x 0.3cm depth; 1.532cm^2 area and 0.459cm^3 volume. There is bone, tendon, and Fat Layer (Subcutaneous Tissue) exposed. There is [name] medium amount of serosanguineous drainage noted. The wound margin is distinct with the outline attached to the wound base. There is large (67-100%) granulation within the wound bed. There is [name] small (1-33%) amount of necrotic tissue within the wound bed including Adherent Slough. The periwound skin appearance exhibited: Callus. The periwound skin appearance did not exhibit: Crepitus, Excoriation, Induration, Rash, Scarring, Dry/Scaly, Maceration, Atrophie Blanche, Cyanosis, Ecchymosis, Hemosiderin Staining, Mottled, Pallor, Rubor, Erythema. Wound #2 status is Open. Original cause of wound was Gradually Appeared. The date acquired was: 03/09/2022. The wound has been in treatment 13 [name]. The wound is located on the [place]. The wound measures 1.5cm length x 1.3cm width x 0.3cm depth; 1.532cm^2 area and 0.459cm^3 volume. There is no tunneling or undermining noted. There is [name] medium amount of serosanguineous drainage noted. There is medium (34-66%) pink granulation within the wound bed. There is [name] medium (34-66%) amount of necrotic tissue within the wound bed including Adherent Slough.  The periwound skin appearance exhibited: Maceration. The periwound skin appearance did not exhibit: Callus, Crepitus, Excoriation, Induration, Rash, Scarring, Dry/Scaly, Atrophie Blanche, Cyanosis, Ecchymosis, Hemosiderin Staining, Mottled, Pallor, Rubor, Erythema. 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 I debrided nonviable tissue to the right lower extremity and Grafix was placed in standard fashion today. Continue compression therapy here. The left foot  x-ray could not exclude osteomyelitis. I recommended continuing antibiotics as the area is very close to bone and there is nonviable tissue. He is high risk for amputation since he is diabetic and has had prior amputations. Continue Dakin's wet-to-dry dressings here. He is using [name] surgical shoe with peg assist. Procedures Wound #1 Pre-procedure diagnosis of Wound #1 is [name] Diabetic Wound/Ulcer of the Lower Extremity located on the Right Achilles .Severity of Tissue Pre Debridement is: Fat layer exposed. There was [name] Excisional Skin/Subcutaneous Tissue Debridement with [name] total area of 4.5 sq cm performed by Darren Shan, DO. With the following instrument(s): Curette to remove Viable and Non-Viable tissue/material. Material removed includes Subcutaneous Tissue and Slough and after achieving pain control using Lidocaine. No specimens were taken. [name] time out was conducted at 13:25, prior to the start of the procedure. [name] Minimum amount of bleeding was controlled with Pressure. The procedure was tolerated well with [name] pain level of 0 throughout and [name] pain level of 0 following the procedure.  Post Debridement Measurements: 4.5cm length x 1cm width x 0.1cm depth; 0.353cm^3 volume. Character of Wound/Ulcer Post Debridement is improved. Severity of Tissue Post Debridement is: Fat layer exposed. Post procedure Diagnosis Wound #1: Same as Pre-Procedure Pre-procedure diagnosis of Wound #1 is [name] Diabetic Wound/Ulcer of the Lower Extremity located on the Right Achilles. [name] skin graft procedure using [name] bioengineered skin substitute/cellular or tissue based product was performed by Darren Shan, DO with the following instrument(s): Forceps and Scissors. Grafix prime was applied and secured with Steri-Strips. 6 sq cm of product was utilized and 0 sq cm was wasted. Post Application, adaptic. gauze was applied. [name] Time Out was conducted at 13:30, prior to the start of the procedure. The procedure was tolerated well with [name] pain level of 0 throughout and [name] pain level of 0 following the procedure. Post procedure Diagnosis Wound #1: Same as Pre-Procedure . Plan Follow-up Appointments: Return Appointment in 1 week. - w/ Dr. Heber Emmonak (appt. on Monday 12/18 @ 1230 Pick up your antibiotics ASAP. We are extending your antibiotics. Other: - [facility]- will ship wound care supplies. Anesthetic: [name], [name] ([id number]) [id number] Page 9 of 11 (In clinic) Topical Lidocaine 5% applied to wound bed Cellular or Tissue Based Products: Cellular or Tissue Based Product Type: - Grafix approved-waiting to hear about copay or coinsurance-cost for you out of your pocket. Kerecsis denied by insurance. Grafix #1 applied on 09/24/22 Grafix #2 applied on 09/29/2022 Grafix # 3 applied on 10/07/22 Grafix # 4 applied on 10/19/22 Cellular or Tissue Based Product applied to wound bed, secured with steri-strips, cover with Adaptic or Mepitel. (DO NOT REMOVE). Bathing/ Shower/ Hygiene: May shower with protection but do not get wound dressing(s) wet. Edema Control - Lymphedema / SCD /  Other: Elevate legs to the level of the heart or above for 30 minutes daily and/or when sitting, [name] frequency of: - 3-4 times [name] day throughout the day. Avoid standing for long periods of time. Off-Loading: Open toe surgical shoe to: - ****put PegAssist in surgical shoe.**** Other: - minimize walking and standing to aid in offloading pressure to wound. Prevalon Boot - left foot while resting in bed at night. The following medication(s) was prescribed: amoxicillin-pot clavulanate oral 875 mg-125 mg tablet 1 1 tablet oral twice [name] day x 14 days starting 10/19/2022 doxycycline hyclate oral 100 mg tablet 1 1 tablet oral twice [name] day x 14 days starting 10/19/2022 WOUND #1: - Achilles Wound Laterality: Right Cleanser: Soap and  Water 1 x Per Week/30 Days Discharge Instructions: May shower and wash wound with dial antibacterial soap and water prior to dressing change. Cleanser: Wound Cleanser 1 x Per Week/30 Days Discharge Instructions: Cleanse the wound with wound cleanser prior to applying [name] clean dressing using gauze sponges, not tissue or cotton balls. Peri-Wound Care: Sween Lotion (Moisturizing lotion) 1 x Per Week/30 Days Discharge Instructions: Apply moisturizing lotion as directed Prim Dressing: Grafix # 4 1 x Per Week/30 Days ary Discharge Instructions: secure with adaptic and steri strips Secondary Dressing: ABD Pad, 5x9 1 x Per Week/30 Days Discharge Instructions: Apply over primary dressing as directed. Secondary Dressing: Woven Gauze Sponge, Non-Sterile 4x4 in 1 x Per Week/30 Days Discharge Instructions: Apply over primary dressing as directed. Com pression Wrap: Kerlix Roll 4.5x3.1 (in/yd) 1 x Per Week/30 Days Discharge Instructions: Apply Kerlix and Coban compression as directed. Com pression Wrap: Coban Self-Adherent Wrap 4x5 (in/yd) 1 x Per Week/30 Days Discharge Instructions: Apply over Kerlix as directed. WOUND #2: - Foot Wound Laterality: Plantar, Left Cleanser: Dakins Solution 1  x Per Day/30 Days Peri-Wound Care: Skin Prep 1 x Per Day/30 Days Discharge Instructions: Use skin prep as directed Prim Dressing: Dakin's Solution 0.25%, 16 (oz) 1 x Per Day/30 Days ary Discharge Instructions: Moisten gauze with Dakin's solution Secondary Dressing: Optifoam Non-Adhesive Dressing, 4x4 in 1 x Per Day/30 Days Discharge Instructions: Apply over as [name] foam donut. Secondary Dressing: Woven Gauze Sponges 2x2 in 1 x Per Day/30 Days Discharge Instructions: Apply over primary dressing as directed. Secured With: [employer], 4.5x3.1 (in/yd) 1 x Per Day/30 Days Discharge Instructions: Secure with Kerlix as directed. Secured With: 74M Medipore H Soft Cloth Surgical T ape, 4 x 10 (in/yd) 1 x Per Day/30 Days Discharge Instructions: Secure with tape as directed. 1. Continue Augmentin and doxycycline 2. Continue Pegasys with surgical shoe 3. Grafix placed in standard fashion 4. Compression therapy 5. In office sharp debridement 6. Follow-up in 1 week Electronic Signature(s) Signed: 10/19/2022 3:53:46 PM By: Darren Shan DO Entered By: [name] on 10/19/2022 14:04:31 -------------------------------------------------------------------------------- HxROS Details Patient Name: Date of Service: [name] [name] 10/19/2022 12:30 PM Medical Record Number: [id number] Patient Account Number: [id number] Date of Birth/Sex: Treating RN: 12/29/1950 (17 y.Darren. M) Primary Care Provider: Marlowe [name] Other Clinician: Referring Provider: Treating Provider/Extender: [name], [name] in Treatment: [address] Information Obtained From Chart [name], [name] ([id number]) [id number] Page 10 of 11 Respiratory Medical History: Positive for: Sleep Apnea - does not wear Cardiovascular Medical History: Positive for: Arrhythmia - [name]. Fib; Congestive Heart Failure - EF 25%; Hypertension; Peripheral Arterial Disease Negative for:  Peripheral Venous Disease Gastrointestinal Medical History: Past Medical History Notes: Ascites Endocrine Medical History: Positive for: Type II Diabetes Time with diabetes: 6 years Treated with: Insulin Blood sugar tested every day: No Genitourinary Medical History: Past Medical History Notes: stage III Kidney disease Immunizations Pneumococcal Vaccine: Received Pneumococcal Vaccination: No Implantable Devices No devices added Hospitalization / Surgery History Type of Hospitalization/Surgery Angiogram 06/11/2022 Dr. 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 Family and Social History Diabetes: Yes - Mother; Heart Disease: Yes - Mother,Siblings; Current every day smoker; Alcohol Use: Never; Drug Use: No History; Caffeine Use: Never; Financial Concerns: No; Food, Clothing or Shelter Needs: No; Support System Lacking: No; Transportation Concerns: No Electronic Signature(s) Signed: 10/19/2022 3:53:46 PM By: Darren Shan DO Entered By: [name] on 10/19/2022 13:35:11 -------------------------------------------------------------------------------- SuperBill Details Patient Name: Date of Service: Jenetta Downer  [name] [name] [facility] [name]. 10/19/2022 Medical Record Number: [id number] Patient Account Number: [id number] Date of Birth/Sex: Treating RN: [birth year] (28 y.Darren. M) Primary Care Provider: Marlowe [name] Other Clinician: Referring Provider: Treating Provider/Extender: [name] Ngetich, [name] in Treatment: 13 Diagnosis Coding ICD-10 Codes Code Description L97.518 Non-pressure chronic ulcer of other part of right foot with other specified severity [name], [name] ([id number]) [phone].pdf Page 11 of 11 L97.522 Non-pressure chronic ulcer of other part of left foot with fat layer exposed E11.621 Type 2 diabetes mellitus with foot ulcer I73.9 Peripheral vascular disease, unspecified L97.813 Non-pressure  chronic ulcer of other part of right lower leg with necrosis of muscle Facility Procedures : CPT4 Code: 33174099 Description: 27800 - WOUND CARE VISIT-LEV 3 EST PT Modifier: Quantity: 1 : CPT4 Code: 44715806 Description: 38685 - SKIN SUB GRAFT TRNK/ARM/LEG ICD-10 Diagnosis Description K88.301 Non-pressure chronic ulcer of other part of right lower leg with necrosis of mu Modifier: scle Quantity: 1 : CPT4 Code: 41597331 Description: Q4133- Grafix PL 2X3 sq cm (6 units) Modifier: Quantity: 1 Physician Procedures : CPT4 Code Description Modifier [id number] - WC PHYS SKIN SUB GRAFT TRNK/ARM/LEG ICD-10 Diagnosis Description B41.290 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

## 2022-10-26 ENCOUNTER — Ambulatory Visit (HOSPITAL_BASED_OUTPATIENT_CLINIC_OR_DEPARTMENT_OTHER): Payer: Medicare HMO | Admitting: Internal Medicine

## 2022-10-26 ENCOUNTER — Encounter (HOSPITAL_BASED_OUTPATIENT_CLINIC_OR_DEPARTMENT_OTHER): Payer: Medicare HMO | Admitting: Internal Medicine

## 2022-10-26 DIAGNOSIS — L97522 Non-pressure chronic ulcer of other part of left foot with fat layer exposed: Secondary | ICD-10-CM | POA: Diagnosis not present

## 2022-10-26 DIAGNOSIS — I110 Hypertensive heart disease with heart failure: Secondary | ICD-10-CM | POA: Diagnosis not present

## 2022-10-26 DIAGNOSIS — L97518 Non-pressure chronic ulcer of other part of right foot with other specified severity: Secondary | ICD-10-CM

## 2022-10-26 DIAGNOSIS — E11621 Type 2 diabetes mellitus with foot ulcer: Secondary | ICD-10-CM | POA: Diagnosis not present

## 2022-10-26 DIAGNOSIS — E1140 Type 2 diabetes mellitus with diabetic neuropathy, unspecified: Secondary | ICD-10-CM | POA: Diagnosis not present

## 2022-10-26 DIAGNOSIS — I739 Peripheral vascular disease, unspecified: Secondary | ICD-10-CM | POA: Diagnosis not present

## 2022-10-26 DIAGNOSIS — Z794 Long term (current) use of insulin: Secondary | ICD-10-CM | POA: Diagnosis not present

## 2022-10-26 DIAGNOSIS — L97813 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: Secondary | ICD-10-CM | POA: Diagnosis not present

## 2022-10-26 DIAGNOSIS — G4733 Obstructive sleep apnea (adult) (pediatric): Secondary | ICD-10-CM | POA: Diagnosis not present

## 2022-10-29 NOTE — Progress Notes (Signed)
LESHAUN, GACKE [name] [id number] Page 1 of 11 Visit Report for 10/26/2022 Arrival Information Details Patient Name: Date of Service: [name] [facility] [name]. 10/26/2022 12:30 PM Medical Record Number: [id number] Patient Account Number: [id number] Date of Birth/Sex: Treating RN: 02-21-1951 (71 y.Darren. M) Primary Care Anedra Penafiel: [name] Other Clinician: Referring Oneill Bais: Treating Milind Raether/Extender: [name] Ngetich, [name] in Treatment: 14 Visit Information History Since Last Visit Added or deleted any medications: No Patient Arrived: Ambulatory Any new allergies or adverse reactions: No Arrival Time: 12:48 Had [name] fall or experienced change in No Accompanied By: self activities of daily living that may affect Transfer Assistance: None risk of falls: Patient Identification Verified: Yes Signs or symptoms of abuse/neglect since last visito No Secondary Verification Process Completed: Yes Hospitalized since last visit: No Patient Requires Transmission-Based Precautions: No Implantable device outside of the clinic excluding No Patient Has Alerts: Yes cellular tissue based products placed in the center Patient Alerts: Patient on Blood Thinner since last visit: Has Compression in Place as Prescribed: Yes Pain Present Now: No Electronic Signature(s) Signed: 10/26/2022 4:19:27 PM By: Erenest Blank Entered By: Erenest Blank on 10/26/2022 12:48:33 -------------------------------------------------------------------------------- Clinic Level of Care Assessment Details Patient Name: Date of Service: [name] [facility] [name]. 10/26/2022 12:30 PM Medical Record Number: [id number] Patient Account Number: [id number] Date of Birth/Sex: Treating RN: 1951/03/26 (21 y.Darren. [name] Primary Care Kerby Borner: [name] Other Clinician: Referring Taneisha Fuson: Treating Revecca Nachtigal/Extender: [name] Ngetich, [name] in Treatment:  14 Clinic Level of Care Assessment Items TOOL 4 Quantity Score X- 1 0 Use when only an EandM is performed on FOLLOW-UP visit ASSESSMENTS - Nursing Assessment / Reassessment X- 1 10 Reassessment of Co-morbidities (includes updates in patient status) X- 1 5 Reassessment of Adherence to Treatment Plan ASSESSMENTS - Wound and Skin [name] ssessment / Reassessment X - Simple Wound Assessment / Reassessment - one wound 1 5 []  - 0 Complex Wound Assessment / Reassessment - multiple wounds []  - 0 Dermatologic / Skin Assessment (not related to wound area) ASSESSMENTS - Focused Assessment X- 1 5 Circumferential Edema Measurements - multi extremities []  - 0 Nutritional Assessment / Counseling / Intervention [name], [name] ([id number].pdf Page 2 of 11 []  - 0 Lower Extremity Assessment (monofilament, tuning fork, pulses) []  - 0 Peripheral Arterial Disease Assessment (using hand held doppler) ASSESSMENTS - Ostomy and/or Continence Assessment and Care []  - 0 Incontinence Assessment and Management []  - 0 Ostomy Care Assessment and Management (repouching, etc.) PROCESS - Coordination of Care []  - 0 Simple Patient / Family Education for ongoing care X- 1 20 Complex (extensive) Patient / Family Education for ongoing care X- 1 10 Staff obtains Programmer, systems, Records, T Results / Process Orders est []  - 0 Staff telephones HHA, Nursing Homes / Clarify orders / etc []  - 0 Routine Transfer to another Facility (non-emergent condition) []  - 0 Routine Hospital Admission (non-emergent condition) []  - 0 New Admissions / Biomedical engineer / Ordering NPWT Apligraf, etc. , []  - 0 Emergency Hospital Admission (emergent condition) []  - 0 Simple Discharge Coordination X- 1 15 Complex (extensive) Discharge Coordination PROCESS - Special Needs []  - 0 Pediatric / Minor Patient Management []  - 0 Isolation Patient Management []  - 0 Hearing / Language / Visual  special needs []  - 0 Assessment of Community assistance (transportation, D/C planning, etc.) []  - 0 Additional assistance / Altered mentation []  - 0 Support Surface(s) Assessment (bed, cushion, seat, etc.) INTERVENTIONS - Wound Cleansing / Measurement  X - Simple Wound Cleansing - one wound 1 5 []  - 0 Complex Wound Cleansing - multiple wounds X- 1 5 Wound Imaging (photographs - any number of wounds) []  - 0 Wound Tracing (instead of photographs) X- 1 5 Simple Wound Measurement - one wound []  - 0 Complex Wound Measurement - multiple wounds INTERVENTIONS - Wound Dressings X - Small Wound Dressing one or multiple wounds 1 10 []  - 0 Medium Wound Dressing one or multiple wounds []  - 0 Large Wound Dressing one or multiple wounds X- 1 5 Application of Medications - topical []  - 0 Application of Medications - injection INTERVENTIONS - Miscellaneous []  - 0 External ear exam []  - 0 Specimen Collection (cultures, biopsies, blood, body fluids, etc.) []  - 0 Specimen(s) / Culture(s) sent or taken to Lab for analysis []  - 0 Patient Transfer (multiple staff / Civil Service fast streamer / Similar devices) []  - 0 Simple Staple / Suture removal (25 or less) []  - 0 Complex Staple / Suture removal (26 or more) []  - 0 Hypo / Hyperglycemic Management (close monitor of Blood Glucose) [name], [name] ([id number] Page 3 of 11 []  - 0 Ankle / Brachial Index (ABI) - do not check if billed separately X- 1 5 Vital Signs Has the patient been seen at the hospital within the last three years: Yes Total Score: 105 Level Of Care: New/Established - Level 3 Electronic Signature(s) Signed: 10/28/2022 5:39:23 PM By: Rhae Hammock RN Entered By: Rhae Hammock on 10/26/2022 13:59:14 -------------------------------------------------------------------------------- Encounter Discharge Information Details Patient Name: Date of Service: [name] [facility] [name]. 10/26/2022  12:30 PM Medical Record Number: [id number] Patient Account Number: [id number] Date of Birth/Sex: Treating RN: June 04, 1951 (22 y.Darren. [name] Primary Care Noraa Pickeral: [name] Other Clinician: Referring Diasia Henken: Treating Kennie Snedden/Extender: [name] Ngetich, [name] in Treatment: 14 Encounter Discharge Information Items Post Procedure Vitals Discharge Condition: Stable Temperature (F): 98.7 Ambulatory Status: Ambulatory Pulse (bpm): 74 Discharge Destination: Home Respiratory Rate (breaths/min): 17 Transportation: Private Auto Blood Pressure (mmHg): 134/74 Accompanied By: daughter Schedule Follow-up Appointment: Yes Clinical Summary of Care: Patient Declined Electronic Signature(s) Signed: 10/28/2022 5:39:23 PM By: Rhae Hammock RN Entered By: Rhae Hammock on 10/26/2022 14:00:21 -------------------------------------------------------------------------------- Lower Extremity Assessment Details Patient Name: Date of Service: [name] [facility] [name]. 10/26/2022 12:30 PM Medical Record Number: [id number] Patient Account Number: [id number] Date of Birth/Sex: Treating RN: 03-12-1951 (63 y.Darren. M) Primary Care Karle Desrosier: [name] Other Clinician: Referring Latise Dilley: Treating Ayleah Hofmeister/Extender: [name] Ngetich, [name] in Treatment: 14 Edema Assessment Assessed: [Left: No] [Right: No] Edema: [Left: No] [Right: No] Calf Left: Right: Point of Measurement: 39 cm From Medial Instep 38.5 cm 36.5 cm Ankle Left: Right: Point of Measurement: 8 cm From Medial Instep 26.5 cm 25 cm Electronic Signature(s) Signed: 10/26/2022 4:19:27 PM By: Merlene Pulling ([id number]) By: Isla Pence.pdf Page 4 of 11 Signed: 10/26/2022 4:19:27 PM Entered By: Erenest Blank on 10/26/2022 13:05:34 -------------------------------------------------------------------------------- Multi Wound Chart  Details Patient Name: Date of Service: [name] [facility] [name]. 10/26/2022 12:30 PM Medical Record Number: [id number] Patient Account Number: [id number] Date of Birth/Sex: Treating RN: May 27, 1951 (89 y.Darren. M) Primary Care Mackenze Grandison: [name] Other Clinician: Referring Solei Wubben: Treating Stefanee Mckell/Extender: [name] Ngetich, [name] in Treatment: 14 Vital Signs Height(in): 68 Pulse(bpm): 90 Weight(lbs): 185 Blood Pressure(mmHg): 140/84 Body Mass Index(BMI): 28.1 Temperature(F): 97.4 Respiratory Rate(breaths/min): 18 [1:Photos:] [N/[name]:N/[name]] Right Achilles Left, Plantar Foot N/[name] Wound Location: Surgical Injury Gradually Appeared N/[name] Wounding Event:  Diabetic Wound/Ulcer of the Lower Diabetic Wound/Ulcer of the Lower N/[name] Primary Etiology: Extremity Extremity Sleep Apnea, Arrhythmia, Congestive Sleep Apnea, Arrhythmia, Congestive N/[name] Comorbid History: Heart Failure, Hypertension, Peripheral Heart Failure, Hypertension, Peripheral Arterial Disease, Type II Diabetes Arterial Disease, Type II Diabetes 07/16/2022 03/09/2022 N/[name] Date Acquired: 14 14 N/[name] Weeks of Treatment: Open Open N/[name] Wound Status: No No N/[name] Wound Recurrence: 3.4x0.4x0.1 2x2x0.4 N/[name] Measurements L x W x D (cm) 1.068 3.142 N/[name] [name] (cm) : rea 0.107 1.257 N/[name] Volume (cm) : 94.80% -1328.20% N/[name] % Reduction in [name] rea: 99.00% -1804.50% N/[name] % Reduction in Volume: Grade 2 Grade 2 N/[name] Classification: Medium Medium N/[name] Exudate [name] mount: Serosanguineous Serosanguineous N/[name] Exudate Type: red, brown red, brown N/[name] Exudate Color: N/[name] Medium (34-66%) N/[name] Granulation [name] mount: N/[name] Pink N/[name] Granulation Quality: N/[name] Medium (34-66%) N/[name] Necrotic [name] mount: Fat Layer (Subcutaneous Tissue): Yes Bone: Yes N/[name] Exposed Structures: Fascia: No Tendon: No Muscle: No Joint: No Bone: No N/[name] None N/[name] Epithelialization: Debridement - Excisional N/[name] N/[name] Debridement: Pre-procedure Verification/Time Out 13:57 N/[name]  N/[name] Taken: Lidocaine N/[name] N/[name] Pain Control: Subcutaneous, Slough N/[name] N/[name] Tissue Debrided: Skin/Subcutaneous Tissue N/[name] N/[name] Level: 1.36 N/[name] N/[name] Debridement [name] (sq cm): rea Curette N/[name] N/[name] Instrument: Minimum N/[name] N/[name] Bleeding: Pressure N/[name] N/[name] Hemostasis [name] chieved: 0 N/[name] N/[name] Procedural Pain: 0 N/[name] N/[name] Post Procedural Pain: Procedure was tolerated well N/[name] N/[name] Debridement Treatment Response: [name], [name] [name] ([id number] Page 5 of 11 Post Debridement Measurements L x 3.4x0.4x0.1 N/[name] N/[name] W x D (cm) 0.107 N/[name] N/[name] Post Debridement Volume: (cm) Excoriation: No Callus: Yes N/[name] Periwound Skin Texture: Induration: No Excoriation: No Callus: No Induration: No Crepitus: No Crepitus: No Rash: No Rash: No Scarring: No Scarring: No Dry/Scaly: Yes Maceration: Yes N/[name] Periwound Skin Moisture: Maceration: No Dry/Scaly: No Atrophie Blanche: No Atrophie Blanche: No N/[name] Periwound Skin Color: Cyanosis: No Cyanosis: No Ecchymosis: No Ecchymosis: No Erythema: No Erythema: No Hemosiderin Staining: No Hemosiderin Staining: No Mottled: No Mottled: No Pallor: No Pallor: No Rubor: No Rubor: No Cellular or Tissue Based Product N/[name] N/[name] Procedures Performed: Debridement Treatment Notes Wound #1 (Achilles) Wound Laterality: Right Cleanser Soap and Water Discharge Instruction: May shower and wash wound with dial antibacterial soap and water prior to dressing change. Wound Cleanser Discharge Instruction: Cleanse the wound with wound cleanser prior to applying [name] clean dressing using gauze sponges, not tissue or cotton balls. Peri-Wound Care Sween Lotion (Moisturizing lotion) Discharge Instruction: Apply moisturizing lotion as directed Topical Primary Dressing Grafix # 4 Discharge Instruction: secure with adaptic and steri strips Secondary Dressing ABD Pad, 5x9 Discharge Instruction: Apply over primary dressing as directed. Woven  Gauze Sponge, Non-Sterile 4x4 in Discharge Instruction: Apply over primary dressing as directed. Secured With Compression Wrap Kerlix Roll 4.5x3.1 (in/yd) Discharge Instruction: Apply Kerlix and Coban compression as directed. Coban Self-Adherent Wrap 4x5 (in/yd) Discharge Instruction: Apply over Kerlix as directed. Compression Stockings Add-Ons Wound #2 (Foot) Wound Laterality: Plantar, Left Cleanser Dakins Solution Peri-Wound Care Skin Prep Discharge Instruction: Use skin prep as directed Topical Primary Dressing Dakin's Solution 0.25%, 16 (oz) Discharge Instruction: Moisten gauze with Dakin's solution Secondary Dressing Optifoam Non-Adhesive Dressing, 4x4 in Discharge Instruction: Apply over as [name] foam donut. SADDIQ, EHRHART [name] ([id number]) [id number] Page 6 of 11 Woven Gauze Sponges 2x2 in Discharge Instruction: Apply over primary dressing as directed. Secured With [employer], 4.5x3.1 (in/yd) Discharge Instruction: Secure with Kerlix as directed. 40M Medipore H Soft Cloth Surgical T ape, 4 x 10 (in/yd) Discharge  Instruction: Secure with tape as directed. Compression Wrap Compression Stockings Add-Ons Electronic Signature(s) Signed: 10/26/2022 3:43:30 PM By: Geralyn Corwin DO Entered By: Geralyn Corwin on 10/26/2022 14:22:06 -------------------------------------------------------------------------------- Multi-Disciplinary Care Plan Details Patient Name: Date of Service: [name] [name]. 10/26/2022 12:30 PM Medical Record Number: [id number] Patient Account Number: [id number] Date of Birth/Sex: Treating RN: 08-19-1951 (32 y.Darren. [name] Primary Care Jennavecia Schwier: Richarda Blade Other Clinician: Referring Kielan Dreisbach: Treating Cady Hafen/Extender: Geralyn Corwin Ngetich, [name] in Treatment: 14 Active Inactive Nutrition Nursing Diagnoses: Potential for alteratiion in Nutrition/Potential for imbalanced  nutrition Goals: Patient/caregiver agrees to and verbalizes understanding of need to use nutritional supplements and/or vitamins as prescribed Date Initiated: 07/16/2022 Target Resolution Date: 11/07/2022 Goal Status: Active Patient/caregiver will maintain therapeutic glucose control Date Initiated: 07/16/2022 Target Resolution Date: 11/07/2022 Goal Status: Active Interventions: Assess HgA1c results as ordered upon admission and as needed Provide education on nutrition Treatment Activities: Education provided on Nutrition : 10/19/2022 Obtain HgA1c : 07/16/2022 Patient referred to Primary Care Physician for further nutritional evaluation : 07/16/2022 Notes: Pain, Acute or Chronic Nursing Diagnoses: Pain, acute or chronic: actual or potential Potential alteration in comfort, pain Goals: Patient will verbalize adequate pain control and receive pain control interventions during procedures as needed Date Initiated: 07/16/2022 Target Resolution Date: 11/07/2022 Goal Status: Active [name], [name] ([id number]) [id number] Page 7 of 11 Interventions: Encourage patient to take pain medications as prescribed Provide education on pain management Reposition patient for comfort Treatment Activities: Administer pain control measures as ordered : 07/16/2022 Notes: Electronic Signature(s) Signed: 10/28/2022 5:39:23 PM By: Fonnie Mu RN Entered By: Fonnie Mu on 10/26/2022 13:32:23 -------------------------------------------------------------------------------- Pain Assessment Details Patient Name: Date of Service: [name] [facility] [name]. 10/26/2022 12:30 PM Medical Record Number: [id number] Patient Account Number: [id number] Date of Birth/Sex: Treating RN: 10-13-1951 (69 y.Darren. M) Primary Care Chalice Philbert: Richarda Blade Other Clinician: Referring Suzane Vanderweide: Treating Keo Schirmer/Extender: Geralyn Corwin Ngetich, [name] in Treatment: 14 Active  Problems Location of Pain Severity and Description of Pain Patient Has Paino No Site Locations Pain Management and Medication Current Pain Management: Electronic Signature(s) Signed: 10/26/2022 4:19:27 PM By: Thayer Dallas Entered By: Thayer Dallas on 10/26/2022 12:50:55 -------------------------------------------------------------------------------- Patient/Caregiver Education Details Patient Name: Date of Service: [name]. 12/18/2023andnbsp12:30 PM Medical Record Number: [id number] Patient Account Number: [id number] [name], [name] ([id number]) [id number] Page 8 of 11 Date of Birth/Gender: Treating RN: 1951/10/18 (62 y.Darren. [name] Primary Care Physician: Richarda Blade Other Clinician: Referring Physician: Treating Physician/Extender: Jae Dire in Treatment: 14 Education Assessment Education Provided To: Patient Education Topics Provided Wound/Skin Impairment: Methods: Explain/Verbal Responses: Reinforcements needed, State content correctly [employer]) Signed: 10/28/2022 5:39:23 PM By: Fonnie Mu RN Entered By: Fonnie Mu on 10/26/2022 13:32:37 -------------------------------------------------------------------------------- Wound Assessment Details Patient Name: Date of Service: [name] [facility] [name]. 10/26/2022 12:30 PM Medical Record Number: [id number] Patient Account Number: [id number] Date of Birth/Sex: Treating RN: 23-Feb-1951 (82 y.Darren. M) Primary Care Jesica Goheen: Richarda Blade Other Clinician: Referring Abbagale Goguen: Treating Yorley Buch/Extender: Geralyn Corwin Ngetich, [name] in Treatment: 14 Wound Status Wound Number: 1 Primary Diabetic Wound/Ulcer of the Lower Extremity Etiology: Wound Location: Right Achilles Wound Open Wounding Event: Surgical Injury Status: Date Acquired: 07/16/2022 Comorbid Sleep Apnea, Arrhythmia, Congestive Heart Failure,  Hypertension, Weeks Of Treatment: 14 History: Peripheral Arterial Disease, Type II Diabetes Clustered Wound: No Photos Wound Measurements Length: (cm) 3.4 Width: (cm) 0.4 Depth: (cm) 0.1 Area: (cm) 1.068 Volume: (cm) 0.107 % Reduction in  Area: 94.8% % Reduction in Volume: 99% Tunneling: No Undermining: No Wound Description Classification: Grade 2 Exudate Amount: Medium Exudate Type: Serosanguineous Exudate Color: red, brown Floren, Ashly [name] Wound Bed [id number] Page 9 of 11 Exposed Structure Fascia Exposed: No Fat Layer (Subcutaneous Tissue) Exposed: Yes Tendon Exposed: No Muscle Exposed: No Joint Exposed: No Bone Exposed: No Periwound Skin Texture Texture Color No Abnormalities Noted: No No Abnormalities Noted: No Callus: No Atrophie Blanche: No Crepitus: No Cyanosis: No Excoriation: No Ecchymosis: No Induration: No Erythema: No Rash: No Hemosiderin Staining: No Scarring: No Mottled: No Pallor: No Moisture Rubor: No No Abnormalities Noted: No Dry / Scaly: Yes Maceration: No Treatment Notes Wound #1 (Achilles) Wound Laterality: Right Cleanser Soap and Water Discharge Instruction: May shower and wash wound with dial antibacterial soap and water prior to dressing change. Wound Cleanser Discharge Instruction: Cleanse the wound with wound cleanser prior to applying [name] clean dressing using gauze sponges, not tissue or cotton balls. Peri-Wound Care Sween Lotion (Moisturizing lotion) Discharge Instruction: Apply moisturizing lotion as directed Topical Primary Dressing Grafix # 4 Discharge Instruction: secure with adaptic and steri strips Secondary Dressing ABD Pad, 5x9 Discharge Instruction: Apply over primary dressing as directed. Woven Gauze Sponge, Non-Sterile 4x4 in Discharge Instruction: Apply over primary dressing as directed. Secured With Compression Wrap Kerlix Roll 4.5x3.1 (in/yd) Discharge  Instruction: Apply Kerlix and Coban compression as directed. Coban Self-Adherent Wrap 4x5 (in/yd) Discharge Instruction: Apply over Kerlix as directed. Compression Stockings Add-Ons Electronic Signature(s) Signed: 10/26/2022 4:19:27 PM By: Erenest Blank Entered By: Erenest Blank on 10/26/2022 13:09:28 [name] ([id number] Page 10 of 11 -------------------------------------------------------------------------------- Wound Assessment Details Patient Name: Date of Service: [name] [facility] [name]. 10/26/2022 12:30 PM Medical Record Number: [id number] Patient Account Number: [id number] Date of Birth/Sex: Treating RN: 03-17-1951 (50 y.Darren. M) Primary Care Darel Ricketts: [name] Other Clinician: Referring Aloysious Vangieson: Treating Rahim Astorga/Extender: [name] Ngetich, [name] in Treatment: 14 Wound Status Wound Number: 2 Primary Diabetic Wound/Ulcer of the Lower Extremity Etiology: Wound Location: Left, Plantar Foot Wound Open Wounding Event: Gradually Appeared Status: Date Acquired: 03/09/2022 Comorbid Sleep Apnea, Arrhythmia, Congestive Heart Failure, Hypertension, Weeks Of Treatment: 14 History: Peripheral Arterial Disease, Type II Diabetes Clustered Wound: No Photos Wound Measurements Length: (cm) 2 Width: (cm) 2 Depth: (cm) 0.4 Area: (cm) 3.142 Volume: (cm) 1.257 % Reduction in Area: -1328.2% % Reduction in Volume: -1804.5% Epithelialization: None Tunneling: No Undermining: No Wound Description Classification: Grade 2 Exudate Amount: Medium Exudate Type: Serosanguineous Exudate Color: red, brown Wound Bed Granulation Amount: Medium (34-66%) Exposed Structure Granulation Quality: Pink Bone Exposed: Yes Necrotic Amount: Medium (34-66%) Necrotic Quality: Adherent Slough Periwound Skin Texture Texture Color No Abnormalities Noted: No No Abnormalities Noted: No Callus: Yes Atrophie Blanche: No Crepitus:  No Cyanosis: No Excoriation: No Ecchymosis: No Induration: No Erythema: No Rash: No Hemosiderin Staining: No Scarring: No Mottled: No Pallor: No Moisture Rubor: No No Abnormalities Noted: No Dry / Scaly: No Maceration: Yes Treatment Notes Wound #2 (Foot) Wound Laterality: Plantar, Left Cleanser Dakins Solution Peri-Wound Care Skin Prep UNIQUE, DARTY [name] ([id number] Page 11 of 11 Discharge Instruction: Use skin prep as directed Topical Primary Dressing Dakin's Solution 0.25%, 16 (oz) Discharge Instruction: Moisten gauze with Dakin's solution Secondary Dressing Optifoam Non-Adhesive Dressing, 4x4 in Discharge Instruction: Apply over as [name] foam donut. Woven Gauze Sponges 2x2 in Discharge Instruction: Apply over primary dressing as directed. Secured With [employer], 4.5x3.1 (in/yd) Discharge Instruction: Secure with Kerlix as directed. 28M Medipore  H Soft Cloth Surgical T ape, 4 x 10 (in/yd) Discharge Instruction: Secure with tape as directed. Compression Wrap Compression Stockings Add-Ons Electronic Signature(s) Signed: 10/26/2022 4:19:27 PM By: Erenest Blank Entered By: Erenest Blank on 10/26/2022 13:08:31 -------------------------------------------------------------------------------- Vitals Details Patient Name: Date of Service: [name] [facility] [name]. 10/26/2022 12:30 PM Medical Record Number: [id number] Patient Account Number: [id number] Date of Birth/Sex: Treating RN: 21-Jul-1951 (23 y.Darren. M) Primary Care Bo Rogue: [name] Other Clinician: Referring Deane Wattenbarger: Treating Angeleigh Chiasson/Extender: [name] Ngetich, [name] in Treatment: 14 Vital Signs Time Taken: 12:48 Temperature (F): 97.4 Height (in): 68 Pulse (bpm): 90 Weight (lbs): 185 Respiratory Rate (breaths/min): 18 Body Mass Index (BMI): 28.1 Blood Pressure (mmHg): 140/84 Reference Range: 80 - 120 mg / dl Electronic  Signature(s) Signed: 10/26/2022 4:19:27 PM By: Erenest Blank Entered By: Erenest Blank on 10/26/2022 12:50:48

## 2022-10-29 NOTE — Progress Notes (Signed)
DAMONI, CAUSBY A ([id number]) [id number] Page 1 of 11 Visit Report for 10/26/2022 Chief Complaint Document Details Patient Name: Date of Service: [name] [facility] A. 10/26/2022 12:30 PM Medical Record Number: [id number] Patient Account Number: [id number] Date of Birth/Sex: Treating RN: 07-31-1951 (71 y.o. M) Primary Care Provider: [name] Other Clinician: Referring Provider: Treating Provider/Extender: [name], [name] in Treatment: 14 Information Obtained from: Patient Chief Complaint 07/16/2022; bilateral lower extremity wounds Electronic Signature(s) Signed: 10/26/2022 3:43:30 PM By: [name] DO Entered By: [name] on 10/26/2022 14:22:16 -------------------------------------------------------------------------------- Cellular or Tissue Based Product Details Patient Name: Date of Service: [name] [name] [facility] A. 10/26/2022 12:30 PM Medical Record Number: [id number] Patient Account Number: [id number] Date of Birth/Sex: Treating RN: [birth year] (71 y.o. [name] Primary Care Provider: [name] Other Clinician: Referring Provider: Treating Provider/Extender: [name] in Treatment: 14 Cellular or Tissue Based Product Type Wound #1 Right Achilles Applied to: Performed By: Physician [name], DO Cellular or Tissue Based Product Type: Grafix prime Level of Consciousness (Pre-procedure): Awake and Alert Pre-procedure Verification/Time Out Yes - 14:00 Taken: Location: genitalia / hands / feet / multiple digits Wound Size (sq cm): 1.36 Product Size (sq cm): 6 Waste Size (sq cm): 0 Amount of Product Applied (sq cm): 6 Instrument Used: Forceps, Scissors Lot #: [id number] Expiration Date: 02/15/2024 Fenestrated: No Reconstituted: Yes Solution Type: NS Solution Amount: 72m Lot #: [id number] Expiration Date: 03/08/2025 Secured: Yes Secured With:  Steri-Strips Dressing Applied: Yes Primary Dressing: adaptic Procedural Pain: 0 Post Procedural Pain: 0 Response to Treatment: Procedure was tolerated well Level of Consciousness (Post- Awake and Alert procedure): OGIANFRANCO, ARAKIA ([id number] Page 2 of 11 Post Procedure Diagnosis Same as Pre-procedure Electronic Signature(s) Signed: 10/26/2022 3:43:30 PM By: HKalman ShanDO Signed: 10/28/2022 5:39:23 PM By: BRhae HammockRN Entered By: BRhae Hammockon 10/26/2022 14:01:13 -------------------------------------------------------------------------------- Debridement Details Patient Name: [facility] 10/26/2022 12:30 PM Medical Record Number: [id number] Account Number: [id number] of Birth/Sex: Treating RN: 1Oct 02, 1952(71y.o. MBurnadette Pop Lauren Primary Care Provider: NMarlowe SaxOther Clinician: Referring Provider: Treating Provider/Extender: HKalman ShanNgetich, [name] in Treatment: 14 Debridement Performed for Assessment: Wound #1 Right Achilles Performed By: Physician HKalman Shan DO Debridement Type: Debridement Severity of Tissue Pre Debridement: Fat layer exposed Level of Consciousness (Pre-procedure): Awake and Alert Pre-procedure Verification/Time Out Yes - 13:57 Taken: Start Time: 13:57 Pain Control: Lidocaine T Area Debrided (L x W): otal 3.4 (cm) x 0.4 (cm) = 1.36 (cm) Tissue and other material debrided: Viable, Non-Viable, Slough, Subcutaneous, Skin: Dermis , Skin: Epidermis, Slough Level: Skin/Subcutaneous Tissue Debridement Description: Excisional Instrument: Curette Bleeding: Minimum Hemostasis Achieved: Pressure End Time: 13:57 Procedural Pain: 0 Post Procedural Pain: 0 Response to Treatment: Procedure was tolerated well Level of Consciousness (Post- Awake and Alert procedure): Post Debridement Measurements of Total Wound Length: (cm)  3.4 Width: (cm) 0.4 Depth: (cm) 0.1 Volume: (cm) 0.107 Character of Wound/Ulcer Post Debridement: Improved Severity of Tissue Post Debridement: Fat layer exposed Post Procedure Diagnosis Same as Pre-procedure Electronic Signature(s) Signed: 10/26/2022 3:43:30 PM By: HKalman ShanDO Signed: 10/28/2022 5:39:23 PM By: BRhae HammockRN Entered By: BRhae Hammockon 10/26/2022 13:58:41 HPI Details -------------------------------------------------------------------------------- [name]([id number]) [id number] Page 3 of 11 Patient Name: Date of [facility]: [name] 10/26/2022 12:30 PM Medical Record Number: [id number] Account Number: [id number] of Birth/Sex: Treating RN: 1Mar 05, 1952(71 y.o.  M) Primary Care Provider: [name] Other Clinician: Referring Provider: Treating Provider/Extender: [name], [name] in Treatment: 14 History of Present Illness HPI Description: Admission 07/16/2022 Mr. 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 He is not sure how the wounds started. He has been following with podiatry for this issue. He had removal of the tendon to the right lower extremity by Dr. SBlenda Mounts He has been using Betadine to the wound beds. On 06/11/2022 he had a right posterior tibial artery angioplasty by Dr. CCarlis Abbott It was reported the patient is optimized after revascularization of the right lower extremity. His ABIs on the left were 0.91. He currently denies systemic signs of infection. He also does not wear shoes. He came in  with Kerlix wrap to his feet bilaterally. This did not completely cover his feet. 07/23/2022: This is a patient of Dr. HJodene Namthat she asked me to take a look at last week due to the significant involvement of the muscle and tendon on his right posterior leg wound. He needed an aggressive debridement and asked me if I would be able to perform this on her behalf. The patient is here today for that procedure. When his dressing was removed in clinic today, the wound was teeming with maggots. There is necrotic muscle, tendon, and fat, with a thick layer of slough on the wound. 9/21; patient presents for follow-up. He was debrided by Dr. CCeline Ahrat last clinic visit without any issues. He has been placing Dakin's wet-to-dry dressings to the right posterior wound. He has been using Medihoney to the left foot wound. He reports improvement in wound healing. He has no issues or complaints today. 9/28; patient with type 2 diabetes PAD status post revascularization. He underwent retrograde right PTA angioplasty. Last saw Dr. CCarlis Abbotton 9/12 and was felt to have a brisk posterior tibial signal at the right ankle. He had a pulsatile toe tracing that appeared adequate for healing. He has been using Santyl and Hydrofera Blue on the right Achilles area. He has a smaller area on the left fifth plantar metatarsal head They were apparently in the ER on 9/23 with swelling and discoloration above the wrap. They have a picture of the leg after the wrap was taken off which looks like that it was excessively tight superiorly 10/6; patient presents for follow-up. We have been using Santyl and Hydrofera Blue to the right lower extremity wound under Kerlix/Coban. T the left foot we o have been using silver alginate with Medihoney. He again comes in with no shoes. He has no issues or complaints today. 10/12; patient presents for follow-up. We have been using Santyl and Hydrofera Blue to the right lower extremity under  Kerlix/Coban And silver alginate to the left foot wound. He has no issues today. He denies signs of infection. 10/19; patient presents for follow-up. We continue to use Santyl and Hydrofera Blue to the right lower extremity under Kerlix/Coban and silver alginate to the left foot wound. There is been improvement in wound healing. 10/26; patient presents for follow-up. We have been using Santyl and Hydrofera Blue to the right lower extremity under Kerlix/Coban and silver alginate to the left  foot. There continues to be improvement in wound healing. Patient has no issues or complaints today. 11/2; the patient's area on the right Achilles heel looks quite healthy and is improved in terms of measurements. We have been using Hydrofera Blue. He is approved for Grafix On the left plantar foot wound over the fifth metatarsal head this tunnels over the lateral part of the met head. He is not offloading this at all 11/16; patient presents for follow-up. He has been using a surgical shoe with an offloading felt pad to the left lateral foot along with silver alginate. He has been approved for Grafix and this is available for placement today T the right lower extremity wound. Patient Is agreeable to this. We have been using Santyl and o Hydrofera Blue under Kerlix/Coban to this area. 11/21; patient presents for follow-up. We have been doing Grafix to the right lower extremity and silver alginate to the left foot wound. He has no issues or complaints today. 11/30; patient presents for follow-up. We have been doing Grafix to the right lower extremity wound and Medihoney with Hydrofera Blue to the left lateral foot wound. He reports pain to the left foot wound today. He did not obtain his x-ray. 12/11; patient presents for follow-up. He has been using Dakin's wet-to-dry dressings to the left plantar foot wound. We have been doing Grafix to the right lower extremity wound under compression therapy. He obtained his  x-ray that showed mild periosteal elevation at the resection site with the possibility of osteomyelitis. He currently denies systemic signs of infection. He has been taking doxycycline and Augmentin without issues. He is getting his INR checked by his primary care office. 12/18; patient presents for follow-up. He has been using Dakin's wet-to-dry dressings to the left foot wound. We have been placing Grafix under compression therapy to the right lower extremity. He is currently taking doxycycline and Augmentin. He denies signs of infection. Electronic Signature(s) Signed: 10/26/2022 3:43:30 PM By: [name] DO Entered By: [name] on 10/26/2022 14:26:05 Physical Exam Details -------------------------------------------------------------------------------- Cordie Grice ([id number]) [id number] Page 4 of 11 Patient Name: Date of Service: [name] [facility] A. 10/26/2022 12:30 PM Medical Record Number: [id number] Patient Account Number: [id number] Date of Birth/Sex: Treating RN: 01-Oct-1951 (71 y.o. M) Primary Care Provider: [name] Other Clinician: Referring Provider: Treating Provider/Extender: [name] Ngetich, [name] in Treatment: 14 Constitutional respirations regular, non-labored and within target range for patient.. Cardiovascular 2+ dorsalis pedis/posterior tibialis pulses. Psychiatric pleasant and cooperative. Notes Right lower extremity: T the posterior aspect there is An open wound with granulation tissue and slough. Left lower extremity: T the plantar aspect of the fifth o o met head there is an open wound with granulation tissue, nonviable tissue and exposed bone. No purulent drainage. Venous stasis dermatitis and lymphedema skin changes bilaterally. Electronic Signature(s) Signed: 10/26/2022 3:43:30 PM By: [name] DO Entered By: [name] on 10/26/2022  14:27:31 -------------------------------------------------------------------------------- Physician Orders Details Patient Name: Date of Service: [name] [facility] A. 10/26/2022 12:30 PM Medical Record Number: [id number] Patient Account Number: [id number] Date of Birth/Sex: Treating RN: July 30, 1951 (71 y.o. [name] Primary Care Provider: [name] Other Clinician: Referring Provider: Treating Provider/Extender: [name] Ngetich, [name] in Treatment: 42 Verbal / Phone Orders: No Diagnosis Coding Follow-up Appointments ppointment in 1 week. - w/ Dr. Heber Embden 12/26 (already has appt.) Return A ppointment in 2 weeks. - Dr. Heber Killian Return A Other: - Hold antibiotics for now so that  we may get a biopsy next Tuesday. If any worsening of wound/signs of infection/ redness/swelling/purulent drainage you need to start antibiotics back and call us. Anesthetic (In clinic) Topical Lidocaine 5% applied to wound bed Cellular or Tissue Based Products Cellular or Tissue Based Product Type: - Grafix approved-waiting to hear about copay or coinsurance-cost for you out of your pocket. Kerecsis denied by insurance. Grafix #1 applied on 09/24/22 Grafix #2 applied on 09/29/2022 Grafix # 3 applied on 10/07/22 Grafix # 4 applied on 10/19/22 Grafix # 5 applied 10/26/2022 Cellular or Tissue Based Product applied to wound bed, secured with steri-strips, cover with Adaptic or Mepitel. (DO NOT REMOVE). Bathing/ Shower/ Hygiene May shower with protection but do not get wound dressing(s) wet. Edema Control - Lymphedema / SCD / Other Elevate legs to the level of the heart or above for 30 minutes daily and/or when sitting, a frequency of: - 3-4 times a day throughout the day. Avoid standing for long periods of time. Off-Loading Open toe surgical shoe to: - ****put PegAssist in surgical shoe.**** Other: - minimize walking and standing to aid in offloading pressure to  wound. Prevalon Boot - left foot while resting in bed at night. Wound Treatment ZEBEDEE, SEGUNDO A ([id number]) [id number] Page 5 of 11 Wound #1 - Achilles Wound Laterality: Right Cleanser: Soap and Water 1 x Per Week/30 Days Discharge Instructions: May shower and wash wound with dial antibacterial soap and water prior to dressing change. Cleanser: Wound Cleanser 1 x Per Week/30 Days Discharge Instructions: Cleanse the wound with wound cleanser prior to applying a clean dressing using gauze sponges, not tissue or cotton balls. Peri-Wound Care: Sween Lotion (Moisturizing lotion) 1 x Per Week/30 Days Discharge Instructions: Apply moisturizing lotion as directed Prim Dressing: Grafix # 4 1 x Per Week/30 Days ary Discharge Instructions: secure with adaptic and steri strips Secondary Dressing: ABD Pad, 5x9 1 x Per Week/30 Days Discharge Instructions: Apply over primary dressing as directed. Secondary Dressing: Woven Gauze Sponge, Non-Sterile 4x4 in 1 x Per Week/30 Days Discharge Instructions: Apply over primary dressing as directed. Compression Wrap: Kerlix Roll 4.5x3.1 (in/yd) 1 x Per Week/30 Days Discharge Instructions: Apply Kerlix and Coban compression as directed. Compression Wrap: Coban Self-Adherent Wrap 4x5 (in/yd) 1 x Per Week/30 Days Discharge Instructions: Apply over Kerlix as directed. Wound #2 - Foot Wound Laterality: Plantar, Left Cleanser: Dakins Solution 1 x Per Day/30 Days Peri-Wound Care: Skin Prep 1 x Per Day/30 Days Discharge Instructions: Use skin prep as directed Prim Dressing: Dakin's Solution 0.25%, 16 (oz) 1 x Per Day/30 Days ary Discharge Instructions: Moisten gauze with Dakin's solution Secondary Dressing: Optifoam Non-Adhesive Dressing, 4x4 in 1 x Per Day/30 Days Discharge Instructions: Apply over as a foam donut. Secondary Dressing: Woven Gauze Sponges 2x2 in 1 x Per Day/30 Days Discharge Instructions: Apply over primary dressing as  directed. Secured With: [employer], 4.5x3.1 (in/yd) 1 x Per Day/30 Days Discharge Instructions: Secure with Kerlix as directed. Secured With: 65M Medipore H Soft Cloth Surgical T ape, 4 x 10 (in/yd) 1 x Per Day/30 Days Discharge Instructions: Secure with tape as directed. Electronic Signature(s) Signed: 10/26/2022 3:43:30 PM By: [name] DO Entered By: [name] on 10/26/2022 14:27:40 -------------------------------------------------------------------------------- Problem List Details Patient Name: Date of Service: [name] [name] [facility] A. 10/26/2022 12:30 PM Medical Record Number: [id number] Patient Account Number: [id number] Date of Birth/Sex: Treating RN: 1950-12-03 (71 y.o. M) Primary Care Provider: [name] Other Clinician: Referring Provider: Treating Provider/Extender: [name], Dinah  Weeks in Treatment: 14 Active Problems ICD-10 Encounter Code Description Active Date MDM Diagnosis L97.518 Non-pressure chronic ulcer of other part of right foot with other specified 07/16/2022 No Yes TREJAN, BUDA A ([id number]) [id number] Page 6 of 11 severity L97.522 Non-pressure chronic ulcer of other part of left foot with fat layer exposed 07/16/2022 No Yes E11.621 Type 2 diabetes mellitus with foot ulcer 07/16/2022 No Yes I73.9 Peripheral vascular disease, unspecified 07/16/2022 No Yes 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 07/23/2022 No Yes muscle Inactive Problems Resolved Problems Electronic Signature(s) Signed: 10/26/2022 3:43:30 PM By: [name] DO Entered By: [name] on 10/26/2022 14:21:59 -------------------------------------------------------------------------------- Progress Note Details Patient Name: Date of Service: [name] 10/26/2022 12:30 PM Medical Record Number: [id number] Patient Account Number: [id number] Date of Birth/Sex:  Treating RN: December 21, 1950 (71 y.o. M) Primary Care Provider: [name] Other Clinician: Referring Provider: Treating Provider/Extender: [name], [name] in Treatment: 14 Subjective Chief Complaint Information obtained from Patient 07/16/2022; bilateral lower extremity wounds History of Present Illness (HPI) Admission 07/16/2022 Mr. 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 He is not sure how the wounds started. He has been following with podiatry for this issue. He had removal of the tendon to the right lower extremity by Dr. SBlenda Mounts He has been using Betadine to the wound beds. On 06/11/2022 he had a right posterior tibial artery angioplasty by Dr. CCarlis Abbott It was reported the patient is optimized after revascularization of the right lower extremity. His ABIs on the left were 0.91. He currently denies systemic signs of infection. He also does not wear shoes. He came in with Kerlix wrap to his feet bilaterally. This did not completely cover his feet. 07/23/2022: This is a patient of Dr. HJodene Namthat she asked me to take a look at last week due to the significant involvement of the muscle and tendon on his right posterior leg wound. He needed an aggressive debridement and asked me if I would be able to perform this on her behalf. The patient is here today for that procedure. When his dressing was removed in clinic today, the wound was teeming with maggots. There is necrotic muscle, tendon, and fat, with a thick layer of slough on the wound. 9/21; patient presents for follow-up. He was debrided by Dr. CCeline Ahr at last clinic visit without any issues. He has been placing Dakin's wet-to-dry dressings to the right posterior wound. He has been using Medihoney to the left foot wound. He reports improvement in wound healing. He has no issues or complaints today. 9/28; patient with type 2 diabetes PAD status post revascularization. He underwent retrograde right PTA angioplasty. Last saw Dr. CCarlis Abbotton 9/12 and was felt to have a brisk posterior tibial signal at the right ankle. He had a pulsatile toe tracing that appeared adequate for healing. He has been using Santyl and Hydrofera Blue on the right Achilles area. oo He has a smaller area on the left fifth plantar metatarsal head They were apparently in the ER on 9/23 with swelling and discoloration above the wrap. They have a picture of the leg after  the wrap was taken off which looks like that it was excessively tight superiorly SAMEER, TEEPLE A ([id number]) [id number] Page 7 of 11 10/6; patient presents for follow-up. We have been using Santyl and Hydrofera Blue to the right lower extremity wound under Kerlix/Coban. T the left foot we o have been using silver alginate with Medihoney. He again comes in with no shoes. He has no issues or complaints today. 10/12; patient presents for follow-up. We have been using Santyl and Hydrofera Blue to the right lower extremity under Kerlix/Coban And silver alginate to the left foot wound. He has no issues today. He denies signs of infection. 10/19; patient presents for follow-up. We continue to use Santyl and Hydrofera Blue to the right lower extremity under Kerlix/Coban and silver alginate to the left foot wound. There is been improvement in wound healing. 10/26; patient presents for follow-up. We have been using Santyl and Hydrofera Blue to the right lower extremity under Kerlix/Coban and silver alginate to the left foot. There continues to be improvement in wound healing. Patient has no  issues or complaints today. 11/2; the patient's area on the right Achilles heel looks quite healthy and is improved in terms of measurements. We have been using Hydrofera Blue. He is approved for Grafix On the left plantar foot wound over the fifth metatarsal head this tunnels over the lateral part of the met head. He is not offloading this at all 11/16; patient presents for follow-up. He has been using a surgical shoe with an offloading felt pad to the left lateral foot along with silver alginate. He has been approved for Grafix and this is available for placement today T the right lower extremity wound. Patient Is agreeable to this. We have been using Santyl and o Hydrofera Blue under Kerlix/Coban to this area. 11/21; patient presents for follow-up. We have been doing Grafix to the right lower extremity and silver alginate to the left foot wound. He has no issues or complaints today. 11/30; patient presents for follow-up. We have been doing Grafix to the right lower extremity wound and Medihoney with Hydrofera Blue to the left lateral foot wound. He reports pain to the left foot wound today. He did not obtain his x-ray. 12/11; patient presents for follow-up. He has been using Dakin's wet-to-dry dressings to the left plantar foot wound. We have been doing Grafix to the right lower extremity wound under compression therapy. He obtained his x-ray that showed mild periosteal elevation at the resection site with the possibility of osteomyelitis. He currently denies systemic signs of infection. He has been taking doxycycline and Augmentin without issues. He is getting his INR checked by his primary care office. 12/18; patient presents for follow-up. He has been using Dakin's wet-to-dry dressings to the left foot wound. We have been placing Grafix under compression therapy to the right lower extremity. He is currently taking doxycycline and Augmentin. He denies signs of infection. Patient  History Information obtained from Chart. Family History Diabetes - Mother, Heart Disease - Mother,Siblings. Social History Current every day smoker, Alcohol Use - Never, Drug Use - No History, Caffeine Use - Never. Medical History Respiratory Patient has history of Sleep Apnea - does not wear Cardiovascular Patient has history of Arrhythmia - A. Fib, Congestive Heart Failure - EF 25%, Hypertension, Peripheral Arterial Disease Denies history of Peripheral Venous Disease Endocrine Patient has history of Type II Diabetes Hospitalization/Surgery History - Angiogram 06/11/2022 Dr. Carlis Abbott [facility]. - cardioversion 11/06/2021, 2015, 2017. 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  Medical A Surgical History Notes nd Gastrointestinal Ascites Genitourinary stage III Kidney disease Objective Constitutional respirations regular, non-labored and within target range for patient.. Vitals Time Taken: 12:48 PM, Height: 68 in, Weight: 185 lbs, BMI: 28.1, Temperature: 97.4 F, Pulse: 90 bpm, Respiratory Rate: 18 breaths/min, Blood Pressure: 140/84 mmHg. Cardiovascular 2+ dorsalis pedis/posterior tibialis pulses. Psychiatric pleasant and cooperative. General Notes: Right lower extremity: T the posterior aspect there is An open wound with granulation tissue and slough. Left lower extremity: T the plantar o o aspect of the fifth met head there is an open wound with granulation tissue, nonviable tissue and exposed bone. No purulent drainage. Venous stasis dermatitis BENTON, TOOKER A ([id number]) [id number] Page 8 of 11 and lymphedema skin changes bilaterally. Integumentary (Hair, Skin) Wound #1 status is Open. Original cause of wound was Surgical Injury. The date acquired was: 07/16/2022. The wound has been in treatment 14 weeks. The wound is located on the Right Achilles. The wound measures 3.4cm length x 0.4cm width x 0.1cm depth; 1.068cm^2 area and 0.107cm^3 volume. There is  Fat Layer (Subcutaneous Tissue) exposed. There is no tunneling or undermining noted. There is a medium amount of serosanguineous drainage noted. The periwound skin appearance exhibited: Dry/Scaly. The periwound skin appearance did not exhibit: Callus, Crepitus, Excoriation, Induration, Rash, Scarring, Maceration, Atrophie Blanche, Cyanosis, Ecchymosis, Hemosiderin Staining, Mottled, Pallor, Rubor, Erythema. Wound #2 status is Open. Original cause of wound was Gradually Appeared. The date acquired was: 03/09/2022. The wound has been in treatment 14 weeks. The wound is located on the [place]. The wound measures 2cm length x 2cm width x 0.4cm depth; 3.142cm^2 area and 1.257cm^3 volume. There is bone exposed. There is no tunneling or undermining noted. There is a medium amount of serosanguineous drainage noted. There is medium (34-66%) pink granulation within the wound bed. There is a medium (34-66%) amount of necrotic tissue within the wound bed including Adherent Slough. The periwound skin appearance exhibited: Callus, Maceration. The periwound skin appearance did not exhibit: Crepitus, Excoriation, Induration, Rash, Scarring, Dry/Scaly, Atrophie Blanche, Cyanosis, Ecchymosis, Hemosiderin Staining, Mottled, Pallor, Rubor, Erythema. 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 He is currently on doxycycline and Augmentin and has been for the past couple weeks. This has not shown significant improvement in his wound healing. I recommended he hold off on antibiotics and I will do a bone biopsy at the next clinic visit, next week. I  did recommend that if he had increased pain, swelling, erythema or warmth that he restart his antibiotics. For now he can continue Dakin's wet-to-dry dressings. T the right lower extremity we have been using Grafix the right lower extremity wound has shown improvement in size and appearance since last clinic visit. I o debrided nonviable tissue and recommended Grafix under compression therapy. Follow-up in 1 week Procedures Wound #1 Pre-procedure diagnosis of Wound #1 is a Diabetic Wound/Ulcer of the Lower Extremity located on the Right Achilles .Severity of Tissue Pre Debridement is: Fat layer exposed. There was a Excisional Skin/Subcutaneous Tissue Debridement with a total area of 1.36 sq cm performed by [name], DO. With the following instrument(s): Curette to remove Viable and Non-Viable tissue/material. Material removed includes Subcutaneous Tissue,  Slough, Skin: Dermis, and Skin: Epidermis after achieving pain control using Lidocaine. No specimens were taken. A time out was conducted at 13:57, prior to the start of the procedure. A Minimum amount of bleeding was controlled with Pressure. The procedure was tolerated well with a pain level of 0 throughout and a pain level of 0 following the procedure. Post Debridement Measurements: 3.4cm length x 0.4cm width x 0.1cm depth; 0.107cm^3 volume. Character of Wound/Ulcer Post Debridement is improved. Severity of Tissue Post Debridement is: Fat layer exposed. Post procedure Diagnosis Wound #1: Same as Pre-Procedure Pre-procedure diagnosis of Wound #1 is a Diabetic Wound/Ulcer of the Lower Extremity located on the Right Achilles. A skin graft procedure using a bioengineered skin substitute/cellular or tissue based product was performed by [name], DO with the following instrument(s): Forceps and Scissors. Grafix prime was applied and secured with Steri-Strips. 6 sq cm of product was utilized and 0 sq cm was wasted. Post Application,  adaptic was applied. A Time Out was conducted at 14:00, prior to the start of the procedure. The procedure was tolerated well with a pain level of 0 throughout and a pain level of 0 following the procedure. Post procedure Diagnosis Wound #1: Same as Pre-Procedure . Plan Follow-up Appointments: Return Appointment in 1 week. - w/ Dr. Heber [place] 12/26 (already has appt.) Return Appointment in 2 weeks. - Dr. Heber  Other: - Hold antibiotics for now so that we may get a biopsy next Tuesday. If any worsening of wound/signs of infection/ redness/swelling/purulent drainage you need to start antibiotics back and call us. Anesthetic: (In clinic) Topical Lidocaine 5% applied to wound bed Cellular or Tissue Based Products: Cellular or Tissue Based Product Type: - Grafix approved-waiting to hear about copay or coinsurance-cost for you out of your pocket. Kerecsis denied by insurance. Grafix #1 applied on 09/24/22 Grafix #2 applied on 09/29/2022 Grafix # 3 applied on 10/07/22 Grafix # 4 applied on 10/19/22 Grafix # 5 applied 10/26/2022 Cellular or Tissue Based Product applied to wound bed, secured with steri-strips, cover with Adaptic or Mepitel. (DO NOT REMOVE). Bathing/ Shower/ Hygiene: May shower with protection but do not get wound dressing(s) wet. Edema Control - Lymphedema / SCD / Other: Elevate legs to the level of the heart or above for 30 minutes daily and/or when sitting, a frequency of: - 3-4 times a day throughout the day. Avoid standing for long periods of time. Off-Loading: Open toe surgical shoe to: - ****put PegAssist in surgical shoe.**** Cordie Grice ([id number]) [id number] Page 9 of 11 Other: - minimize walking and standing to aid in offloading pressure to wound. Prevalon Boot - left foot while resting in bed at night. WOUND #1: - Achilles Wound Laterality: Right Cleanser: Soap and Water 1 x Per Week/30 Days Discharge Instructions: May shower and  wash wound with dial antibacterial soap and water prior to dressing change. Cleanser: Wound Cleanser 1 x Per Week/30 Days Discharge Instructions: Cleanse the wound with wound cleanser prior to applying a clean dressing using gauze sponges, not tissue or cotton balls. Peri-Wound Care: Sween Lotion (Moisturizing lotion) 1 x Per Week/30 Days Discharge Instructions: Apply moisturizing lotion as directed Prim Dressing: Grafix # 4 1 x Per Week/30 Days ary Discharge Instructions: secure with adaptic and steri strips Secondary Dressing: ABD Pad, 5x9 1 x Per Week/30 Days Discharge Instructions: Apply over primary dressing as directed. Secondary Dressing: Woven Gauze Sponge, Non-Sterile 4x4 in 1 x Per Week/30 Days Discharge Instructions: Apply over primary dressing as directed. Com pression  Wrap: Kerlix Roll 4.5x3.1 (in/yd) 1 x Per Week/30 Days Discharge Instructions: Apply Kerlix and Coban compression as directed. Com pression Wrap: Coban Self-Adherent Wrap 4x5 (in/yd) 1 x Per Week/30 Days Discharge Instructions: Apply over Kerlix as directed. WOUND #2: - Foot Wound Laterality: Plantar, Left Cleanser: Dakins Solution 1 x Per Day/30 Days Peri-Wound Care: Skin Prep 1 x Per Day/30 Days Discharge Instructions: Use skin prep as directed Prim Dressing: Dakin's Solution 0.25%, 16 (oz) 1 x Per Day/30 Days ary Discharge Instructions: Moisten gauze with Dakin's solution Secondary Dressing: Optifoam Non-Adhesive Dressing, 4x4 in 1 x Per Day/30 Days Discharge Instructions: Apply over as a foam donut. Secondary Dressing: Woven Gauze Sponges 2x2 in 1 x Per Day/30 Days Discharge Instructions: Apply over primary dressing as directed. Secured With: [employer], 4.5x3.1 (in/yd) 1 x Per Day/30 Days Discharge Instructions: Secure with Kerlix as directed. Secured With: 75M Medipore H Soft Cloth Surgical T ape, 4 x 10 (in/yd) 1 x Per Day/30 Days Discharge Instructions: Secure with tape as directed. 1. Dakin's  wet-to-dry dressingsooleft lower extremity 2. Aggressive offloadingoosurgical shoe with Pegasys 3. Grafix placed in standard fashion 4. Hold antibiotics 5. Follow-up in 1 week 6. Kerlix/Coban Electronic Signature(s) Signed: 10/26/2022 3:43:30 PM By: [name] DO Entered By: [name] on 10/26/2022 14:29:35 -------------------------------------------------------------------------------- HxROS Details Patient Name: Date of Service: [name] [facility] A. 10/26/2022 12:30 PM Medical Record Number: [id number] Patient Account Number: [id number] Date of Birth/Sex: Treating RN: 1951/05/21 (71 y.o. M) Primary Care Provider: [name] Other Clinician: Referring Provider: Treating Provider/Extender: [name] Ngetich, [name] in Treatment: 14 Information Obtained From Chart Respiratory Medical History: Positive for: Sleep Apnea - does not wear Cardiovascular Medical History: Positive for: Arrhythmia - A. Fib; Congestive Heart Failure - EF 25%; Hypertension; Peripheral Arterial Disease Negative for: Peripheral Venous Disease Gastrointestinal Medical History: Past Medical History NotesMarland Kitchen LAINE, GIOVANETTI ([id number]) [id number] Page 10 of 11 [facility] Endocrine Medical History: Positive for: Type II Diabetes Time with diabetes: 6 years Treated with: Insulin Blood sugar tested every day: No Genitourinary Medical History: Past Medical History Notes: stage III Kidney disease Immunizations Pneumococcal Vaccine: Received Pneumococcal Vaccination: No Implantable Devices No devices added Hospitalization / Surgery History Type of Hospitalization/Surgery Angiogram 06/11/2022 Dr. 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 10/26/2022 Medical Record Number: [id number] Patient Account Number: [id number] Date of Birth/Sex: Treating RN: 1951/08/04 (71 y.o. 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 Quantity: 1 pdf Page 11 of 11 Physician Procedures : CPT4 Code Description Modifier [id number] - WC PHYS SKIN SUB GRAFT FACE/NK/HF/G ICD-10 Diagnosis Description L97.518 Non-pressure chronic ulcer of other part of right foot with other specified severity Quantity: 1 Electronic Signature(s) Signed:  10/26/2022 3:43:30 PM By: [name] DO Entered By: [name] on 10/26/2022 14:29:44

## 2022-11-03 ENCOUNTER — Encounter (HOSPITAL_BASED_OUTPATIENT_CLINIC_OR_DEPARTMENT_OTHER): Payer: Medicare HMO | Admitting: Internal Medicine

## 2022-11-03 DIAGNOSIS — Z794 Long term (current) use of insulin: Secondary | ICD-10-CM | POA: Diagnosis not present

## 2022-11-03 DIAGNOSIS — E11621 Type 2 diabetes mellitus with foot ulcer: Secondary | ICD-10-CM | POA: Diagnosis not present

## 2022-11-03 DIAGNOSIS — E1140 Type 2 diabetes mellitus with diabetic neuropathy, unspecified: Secondary | ICD-10-CM | POA: Diagnosis not present

## 2022-11-03 DIAGNOSIS — L97813 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: Secondary | ICD-10-CM

## 2022-11-03 DIAGNOSIS — I739 Peripheral vascular disease, unspecified: Secondary | ICD-10-CM

## 2022-11-03 DIAGNOSIS — L97522 Non-pressure chronic ulcer of other part of left foot with fat layer exposed: Secondary | ICD-10-CM | POA: Diagnosis not present

## 2022-11-03 DIAGNOSIS — G4733 Obstructive sleep apnea (adult) (pediatric): Secondary | ICD-10-CM | POA: Diagnosis not present

## 2022-11-03 DIAGNOSIS — L97518 Non-pressure chronic ulcer of other part of right foot with other specified severity: Secondary | ICD-10-CM | POA: Diagnosis not present

## 2022-11-03 DIAGNOSIS — I110 Hypertensive heart disease with heart failure: Secondary | ICD-10-CM | POA: Diagnosis not present

## 2022-11-03 NOTE — Progress Notes (Signed)
DEMARKO, ZEIMET A ([id number]) [id number] Page 1 of 12 Visit Report for 11/03/2022 Chief Complaint Document Details Patient Name: Date of Service: [name] [facility] A. 11/03/2022 11:15 A M Medical Record Number: [id number] Patient Account Number: [id number] Date of Birth/Sex: Treating RN: [birth year] (71 y.o. M) Primary Care Provider: [name] Other Clinician: Referring Provider: Treating Provider/Extender: [name], [name] in Treatment: 15 Information Obtained from: Patient Chief Complaint 07/16/2022; bilateral lower extremity wounds Electronic Signature(s) Signed: 11/03/2022 3:18:07 PM By: [name] DO Entered By: [name] on 11/03/2022 12:36:25 -------------------------------------------------------------------------------- Cellular or Tissue Based Product Details Patient Name: Date of Service: [name] [facility] A. 11/03/2022 11:15 A M Medical Record Number: [id number] Patient Account Number: [id number] Date of Birth/Sex: Treating RN: 1950/11/20 (71 y.o. [name] Primary Care Provider: [name] Other Clinician: Referring Provider: Treating Provider/Extender: [name] in Treatment: 15 Cellular or Tissue Based Product Type Wound #1 Right Achilles Applied to: Performed By: Physician [name], DO Cellular or Tissue Based Product Type: Grafix prime Level of Consciousness (Pre-procedure): Awake and Alert Pre-procedure Verification/Time Out Yes - 12:08 Taken: Location: trunk / arms / legs Wound Size (sq cm): 0.64 Product Size (sq cm): 2 Waste Size (sq cm): 0 Amount of Product Applied (sq cm): 2 Instrument Used: Forceps, Scissors Lot #: [id number] Order #: 6 Expiration Date: 01/06/2024 Fenestrated: No Reconstituted: Yes Solution Type: normal saline Solution Amount: 31m Lot #: [id number] Expiration Date: 04/08/2025 Secured: Yes Secured With:  Steri-Strips, adaptic Dressing Applied: No Procedural Pain: 0 Post Procedural Pain: 0 Response to Treatment: Procedure was tolerated well Level of Consciousness (Post- Awake and Alert procedure): OTEJUAN, GHOLSONA ([id number]) [id number] Page 2 of 12 Post Procedure Diagnosis Same as Pre-procedure Electronic Signature(s) Signed: 11/03/2022 1:12:16 PM By: DDeon PillingRN, BSN Signed: 11/03/2022 3:18:07 PM By: HKalman ShanDO Entered By: DDeon Pillingon 11/03/2022 12:26:05 -------------------------------------------------------------------------------- Debridement Details Patient Name: [facility] 11/03/2022 11:15 A M Medical Record Number: [id number] Account Number: [id number] of Birth/Sex: Treating RN: 1May 17, 1952(71y.o. MHessie DienerPrimary Care Provider: NMarlowe SaxOther Clinician: Referring Provider: Treating Provider/Extender: HKalman ShanNgetich, [name] in Treatment: 15 Debridement Performed for Assessment: Wound #1 Right Achilles Performed By: Physician HKalman Shan DO Debridement Type: Debridement Severity of Tissue Pre Debridement: Fat layer exposed Level of Consciousness (Pre-procedure): Awake and Alert Pre-procedure Verification/Time Out Yes - 12:00 Taken: Start Time: 12:01 Pain Control: Lidocaine 4% T opical Solution T Area Debrided (L x W): otal 3.2 (cm) x 1 (cm) = 3.2 (cm) Tissue and other material debrided: Viable, Non-Viable, Callus, Slough, Subcutaneous, Skin: Dermis , Skin: Epidermis, Slough Level: Skin/Subcutaneous Tissue Debridement Description: Excisional Instrument: Curette Bleeding: Minimum Hemostasis Achieved: Pressure End Time: 12:07 Procedural Pain: 0 Post Procedural Pain: 0 Response to Treatment: Procedure was tolerated well Level of Consciousness (Post- Awake and Alert procedure): Post Debridement Measurements of Total Wound Length: (cm)  3.2 Width: (cm) 0.5 Depth: (cm) 0.1 Volume: (cm) 0.126 Character of Wound/Ulcer Post Debridement: Improved Severity of Tissue Post Debridement: Fat layer exposed Post Procedure Diagnosis Same as Pre-procedure Electronic Signature(s) Signed: 11/03/2022 1:12:16 PM By: DDeon PillingRN, BSN Signed: 11/03/2022 3:18:07 PM By: HKalman ShanDO Entered By: DDeon Pillingon 11/03/2022 12:23:04 HPI Details -------------------------------------------------------------------------------- [name]([id number]) [id number] Page 3 of 12 Patient Name: Date of Service: [name] 11/03/2022 11:15 A M Medical Record Number: [id number] Account  Number: [id number] Date of Birth/Sex: Treating RN: 09/20/1951 (71 y.o. M) Primary Care Provider: [name] Other Clinician: Referring Provider: Treating Provider/Extender: [name], [name] in Treatment: 15 History of Present Illness HPI Description: Admission 07/16/2022 Mr. 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 He is not sure how the wounds started. He has been following with podiatry for this issue. He had removal of the tendon to the right lower extremity by Dr. SBlenda Mounts He has been using Betadine to the wound beds. On 06/11/2022 he had a right posterior tibial artery angioplasty by Dr. CCarlis Abbott It was reported the patient is optimized after revascularization of the right lower extremity. His ABIs on the left were 0.91. He currently denies systemic signs of infection. He also does not wear shoes. He came in with  Kerlix wrap to his feet bilaterally. This did not completely cover his feet. 07/23/2022: This is a patient of Dr. HJodene Namthat she asked me to take a look at last week due to the significant involvement of the muscle and tendon on his right posterior leg wound. He needed an aggressive debridement and asked me if I would be able to perform this on her behalf. The patient is here today for that procedure. When his dressing was removed in clinic today, the wound was teeming with maggots. There is necrotic muscle, tendon, and fat, with a thick layer of slough on the wound. 9/21; patient presents for follow-up. He was debrided by Dr. CCeline Ahrat last clinic visit without any issues. He has been placing Dakin's wet-to-dry dressings to the right posterior wound. He has been using Medihoney to the left foot wound. He reports improvement in wound healing. He has no issues or complaints today. 9/28; patient with type 2 diabetes PAD status post revascularization. He underwent retrograde right PTA angioplasty. Last saw Dr. CCarlis Abbotton 9/12 and was felt to have a brisk posterior tibial signal at the right ankle. He had a pulsatile toe tracing that appeared adequate for healing. He has been using Santyl and Hydrofera Blue on the right Achilles area. He has a smaller area on the left fifth plantar metatarsal head They were apparently in the ER on 9/23 with swelling and discoloration above the wrap. They have a picture of the leg after the wrap was taken off which looks like that it was excessively tight superiorly 10/6; patient presents for follow-up. We have been using Santyl and Hydrofera Blue to the right lower extremity wound under Kerlix/Coban. T the left foot we o have been using silver alginate with Medihoney. He again comes in with no shoes. He has no issues or complaints today. 10/12; patient presents for follow-up. We have been using Santyl and Hydrofera Blue to the right lower extremity under Kerlix/Coban  And silver alginate to the left foot wound. He has no issues today. He denies signs of infection. 10/19; patient presents for follow-up. We continue to use Santyl and Hydrofera Blue to the right lower extremity under Kerlix/Coban and silver alginate to the left foot wound. There is been improvement in wound healing. 10/26; patient presents for follow-up. We have been using Santyl and Hydrofera Blue to the right  lower extremity under Kerlix/Coban and silver alginate to the left foot. There continues to be improvement in wound healing. Patient has no issues or complaints today. 11/2; the patient's area on the right Achilles heel looks quite healthy and is improved in terms of measurements. We have been using Hydrofera Blue. He is approved for Grafix On the left plantar foot wound over the fifth metatarsal head this tunnels over the lateral part of the met head. He is not offloading this at all 11/16; patient presents for follow-up. He has been using a surgical shoe with an offloading felt pad to the left lateral foot along with silver alginate. He has been approved for Grafix and this is available for placement today T the right lower extremity wound. Patient Is agreeable to this. We have been using Santyl and o Hydrofera Blue under Kerlix/Coban to this area. 11/21; patient presents for follow-up. We have been doing Grafix to the right lower extremity and silver alginate to the left foot wound. He has no issues or complaints today. 11/30; patient presents for follow-up. We have been doing Grafix to the right lower extremity wound and Medihoney with Hydrofera Blue to the left lateral foot wound. He reports pain to the left foot wound today. He did not obtain his x-ray. 12/11; patient presents for follow-up. He has been using Dakin's wet-to-dry dressings to the left plantar foot wound. We have been doing Grafix to the right lower extremity wound under compression therapy. He obtained his x-ray that  showed mild periosteal elevation at the resection site with the possibility of osteomyelitis. He currently denies systemic signs of infection. He has been taking doxycycline and Augmentin without issues. He is getting his INR checked by his primary care office. 12/18; patient presents for follow-up. He has been using Dakin's wet-to-dry dressings to the left foot wound. We have been placing Grafix under compression therapy to the right lower extremity. He is currently taking doxycycline and Augmentin. He denies signs of infection. 12/26; patient presents for follow-up. He has been using Dakin's wet-to-dry dressings to the left foot. He has been off oral antibiotics for potential bone biopsy. We have been placing Grafix under compression therapy to the right lower extremity. There has been improvement in wound healing to both sites. He states he is trying to aggressively offload the left foot. He currently denies signs of infection. Electronic Signature(s) Signed: 11/03/2022 3:18:07 PM By: [name] DO Entered By: [name] on 11/03/2022 12:46:27 Cordie Grice ([id number]) [id number] Page 4 of 12 -------------------------------------------------------------------------------- Physical Exam Details Patient Name: Date of Service: Jenetta Downer DRO [name] [facility] A. 11/03/2022 11:15 A M Medical Record Number: [id number] Patient Account Number: [id number] Date of Birth/Sex: Treating RN: 1951-06-18 (71 y.o. M) Primary Care Provider: [name] Other Clinician: Referring Provider: Treating Provider/Extender: [name] Ngetich, [name] in Treatment: 15 Constitutional respirations regular, non-labored and within target range for patient.. Cardiovascular 2+ dorsalis pedis/posterior tibialis pulses. Psychiatric pleasant and cooperative. Notes Right lower extremity: T the posterior aspect there is An open wound with granulation tissue and slough. Left  lower extremity: T the plantar aspect of the fifth o o met head there is an open wound with granulation tissue and nonviable tissue. Wound probes to bone but there is no longer obvious exposed bone. No purulent drainage. Venous stasis dermatitis and lymphedema skin changes bilaterally. Electronic Signature(s) Signed: 11/03/2022 3:18:07 PM By: [name] DO Entered By: [name] on 11/03/2022 12:46:39 -------------------------------------------------------------------------------- Physician Orders Details Patient Name: Date of Service:  Jenetta Downer DRO [name] [name] 11/03/2022 11:15 A M Medical Record Number: [id number] Patient Account Number: [id number] Date of Birth/Sex: Treating RN: 1951-09-28 (71 y.o. [name] Primary Care Provider: [name] Other Clinician: Referring Provider: Treating Provider/Extender: [name] Ngetich, [name] in Treatment: 15 Verbal / Phone Orders: No Diagnosis Coding Follow-up Appointments ppointment in 1 week. - w/ Dr. Heber [place] Return A ppointment in 2 weeks. - Dr. Heber [place] Return A Other: - deep tissue culture taken. If any worsening of wound/signs of infection/ redness/swelling/purulent drainage you need to start antibiotics back and call us. Anesthetic (In clinic) Topical Lidocaine 5% applied to wound bed Cellular or Tissue Based Products Cellular or Tissue Based Product Type: - Grafix approved-waiting to hear about copay or coinsurance-cost for you out of your pocket. Kerecsis denied by insurance. Grafix #1 applied on 09/24/22 Grafix #2 applied on 09/29/2022 Grafix # 3 applied on 10/07/22 Grafix # 4 applied on 10/19/22 Grafix # 5 applied 10/26/2022 Grafix applied to right achilles wound 11/03/2022 Cellular or Tissue Based Product applied to wound bed, secured with steri-strips, cover with Adaptic or Mepitel. (DO NOT REMOVE). Edema Control - Lymphedema / SCD / Other Avoid standing for long periods of  time. Off-Loading DARRILL, VREELAND A ([id number]) [id number] Page 5 of 12 Open toe surgical shoe to: - ****put PegAssist in surgical shoe.**** Prevalon Boot - left foot while resting in bed at night. Other: - minimize walking and standing to aid in offloading pressure to wound. Wound Treatment Wound #1 - Achilles Wound Laterality: Right Cleanser: Soap and Water 1 x Per Week/30 Days Discharge Instructions: May shower and wash wound with dial antibacterial soap and water prior to dressing change. Cleanser: Wound Cleanser 1 x Per Week/30 Days Discharge Instructions: Cleanse the wound with wound cleanser prior to applying a clean dressing using gauze sponges, not tissue or cotton balls. Peri-Wound Care: Sween Lotion (Moisturizing lotion) 1 x Per Week/30 Days Discharge Instructions: Apply moisturizing lotion as directed Prim Dressing: Grafix 1 x Per Week/30 Days ary Discharge Instructions: secure with adaptic and steri strips Secondary Dressing: ABD Pad, 5x9 1 x Per Week/30 Days Discharge Instructions: Apply over primary dressing as directed. Secondary Dressing: Woven Gauze Sponge, Non-Sterile 4x4 in 1 x Per Week/30 Days Discharge Instructions: Apply over primary dressing as directed. Compression Wrap: Kerlix Roll 4.5x3.1 (in/yd) 1 x Per Week/30 Days Discharge Instructions: Apply Kerlix and Coban compression as directed. Compression Wrap: Coban Self-Adherent Wrap 4x5 (in/yd) 1 x Per Week/30 Days Discharge Instructions: Apply over Kerlix as directed. Wound #2 - Foot Wound Laterality: Plantar, Left Cleanser: Dakins Solution 1 x Per Day/30 Days Peri-Wound Care: Skin Prep 1 x Per Day/30 Days Discharge Instructions: Use skin prep as directed Prim Dressing: Dakin's Solution 0.25%, 16 (oz) 1 x Per Day/30 Days ary Discharge Instructions: Moisten gauze with Dakin's solution Secondary Dressing: Optifoam Non-Adhesive Dressing, 4x4 in 1 x Per Day/30 Days Discharge  Instructions: Apply over as a foam donut. Secondary Dressing: Woven Gauze Sponges 2x2 in 1 x Per Day/30 Days Discharge Instructions: Apply over primary dressing as directed. Secured With: [employer], 4.5x3.1 (in/yd) 1 x Per Day/30 Days Discharge Instructions: Secure with Kerlix as directed. Secured With: 72M Medipore H Soft Cloth Surgical T ape, 4 x 10 (in/yd) 1 x Per Day/30 Days Discharge Instructions: Secure with tape as directed. Consults Podiatry - ****URGENT****[facility] Dr.Wagner Referral to podiatry for bone biopsy of left lateral foot wound. Laboratory erobe culture (MICRO) - deep tissue culture of left  lateral foot wound. Bacteria identified in Unspecified specimen by A LOINC Code: 503-5 Convenience Name: Aerobic culture-specimen not specified Electronic Signature(s) Signed: 11/03/2022 3:18:07 PM By: [name] DO Entered By: [name] on 11/03/2022 46:56:81 Cordie Grice ([id number]) [id number] Page 6 of 12 Prescription 11/03/2022 -------------------------------------------------------------------------------- [name]. [name] DO Patient Name: Provider: Nov 04, 1951 [id number] Date of Birth: NPI#: Jerilynn Mages [id number] Sex: DEA #: [phone] Phone #: License #: [place] Patient Address: [place] [address] [place], [place] [address] [place], [place] [address] [phone] Allergies No Known Drug Allergies Provider's Orders Podiatry - ****URGENT****[facility] Dr.Wagner Referral to podiatry for bone biopsy of left lateral foot wound. Hand Signature: Date(s): Prescription 11/03/2022 [place], [place] John A. [name] DO Patient Name: Provider: 09-Dec-1950 [id number] Date of Birth: NPI#: Jerilynn Mages [id number] Sex: DEA #: [phone] Phone #: License #: [place] Patient  Address: [place] [name] [place], [place] [address] [place],  [address] [phone] Allergies No Known Drug Allergies Provider's Orders erobe culture - deep tissue culture of left lateral foot wound. Bacteria identified in Unspecified specimen by A LOINC Code: 482-7 Convenience Name: Aerobic culture-specimen not specified Hand Signature: Date(s): Electronic Signature(s) Signed: 11/03/2022 3:18:07 PM By: [name] DO Entered By: [name] on 11/03/2022 12:46:49 -------------------------------------------------------------------------------- Problem List Details Patient Name: Date of Service: [name] [name] 11/03/2022 11:15 A M Medical Record Number: [id number] Patient Account Number: [id number] Date of Birth/Sex: Treating RN: [birth year] (71 y.o. M) Primary Care Provider: [name] Other Clinician: Referring Provider: Treating Provider/Extender: [name], [name] in Treatment: 15 Active Problems ICD-10 Encounter Code Description Active Date MDM Diagnosis MARGUES, FILIPPINI A ([id number]) [id number] Page 7 of 12 L97.518 Non-pressure chronic ulcer of other part of right foot with other specified 07/16/2022 No Yes severity L97.522 Non-pressure chronic ulcer of other part of left foot with fat layer exposed 07/16/2022 No Yes E11.621 Type 2 diabetes mellitus with foot ulcer 07/16/2022 No Yes I73.9 Peripheral vascular disease, unspecified 07/16/2022 No Yes 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 07/23/2022 No Yes muscle Inactive Problems Resolved Problems Electronic Signature(s) Signed: 11/03/2022 3:18:07 PM By: [name] DO Entered By: [name] on 11/03/2022 12:35:38 -------------------------------------------------------------------------------- Progress Note Details Patient Name: Date of Service: [name] [name]  11/03/2022 11:15 A M Medical Record Number: [id number] Patient Account Number: [id number] Date of Birth/Sex: Treating RN: 1951/05/01 (71 y.o. M) Primary Care Provider: [name] Other Clinician: Referring Provider: Treating Provider/Extender: [name], [name] in Treatment: 15 Subjective Chief Complaint Information obtained from Patient 07/16/2022; bilateral lower extremity wounds History of Present Illness (HPI) Admission 07/16/2022 Mr. 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 He is not sure how the wounds started. He has been following with podiatry for this issue. He had removal of the tendon to the right lower extremity by Dr. SBlenda Mounts He has been using Betadine to the wound beds. On 06/11/2022 he had a right posterior tibial artery angioplasty by Dr. CCarlis Abbott It was  reported the patient is optimized after revascularization of the right lower extremity. His ABIs on the left were 0.91. He currently denies systemic signs of infection. He also does not wear shoes. He came in with Kerlix wrap to his feet bilaterally. This did not completely cover his feet. 07/23/2022: This is a patient of Dr. Jodene Nam that she asked me to take a look at last week due to the significant involvement of the muscle and tendon on his right posterior leg wound. He needed an aggressive debridement and asked me if I would be able to perform this on her behalf. The patient is here today for that procedure. When his dressing was removed in clinic today, the wound was teeming with maggots. There is necrotic muscle, tendon, and fat, with  a thick layer of slough on the wound. 9/21; patient presents for follow-up. He was debrided by Dr. Celine Ahr at last clinic visit without any issues. He has been placing Dakin's wet-to-dry dressings to the right posterior wound. He has been using Medihoney to the left foot wound. He reports improvement in wound healing. He has no issues or complaints today. 9/28; patient with type 2 diabetes PAD status post revascularization. He underwent retrograde right PTA angioplasty. Last saw Dr. Carlis Abbott on 9/12 and was felt to have a brisk posterior tibial signal at the right ankle. He had a pulsatile toe tracing that appeared adequate for healing. He has been using Santyl and Hydrofera Blue on the right Achilles area. oo He has a smaller area on the left fifth plantar metatarsal head They were apparently in the ER on 9/23 with swelling and discoloration above the wrap. They have a picture of the leg after the wrap was taken off which looks BREYLIN, DOM A ([id number]) [id number] Page 8 of 12 like that it was excessively tight superiorly 10/6; patient presents for follow-up. We have been using Santyl and Hydrofera Blue to the right lower extremity wound under Kerlix/Coban. T the left foot we o have been using silver alginate with Medihoney. He again comes in with no shoes. He has no issues or complaints today. 10/12; patient presents for follow-up. We have been using Santyl and Hydrofera Blue to the right lower extremity under Kerlix/Coban And silver alginate to the left foot wound. He has no issues today. He denies signs of infection. 10/19; patient presents for follow-up. We continue to use Santyl and Hydrofera Blue to the right lower extremity under Kerlix/Coban and silver alginate to the left foot wound. There is been improvement in wound healing. 10/26; patient presents for follow-up. We have been using Santyl and Hydrofera Blue to the right lower extremity under Kerlix/Coban  and silver alginate to the left foot. There continues to be improvement in wound healing. Patient has no issues or complaints today. 11/2; the patient's area on the right Achilles heel looks quite healthy and is improved in terms of measurements. We have been using Hydrofera Blue. He is approved for Grafix On the left plantar foot wound over the fifth metatarsal head this tunnels over the lateral part of the met head. He is not offloading this at all 11/16; patient presents for follow-up. He has been using a surgical shoe with an offloading felt pad to the left lateral foot along with silver alginate. He has been approved for Grafix and this is available for placement today T the right lower extremity wound. Patient Is agreeable to this. We have been using Santyl and o Hydrofera Blue under Kerlix/Coban to this area.  11/21; patient presents for follow-up. We have been doing Grafix to the right lower extremity and silver alginate to the left foot wound. He has no issues or complaints today. 11/30; patient presents for follow-up. We have been doing Grafix to the right lower extremity wound and Medihoney with Hydrofera Blue to the left lateral foot wound. He reports pain to the left foot wound today. He did not obtain his x-ray. 12/11; patient presents for follow-up. He has been using Dakin's wet-to-dry dressings to the left plantar foot wound. We have been doing Grafix to the right lower extremity wound under compression therapy. He obtained his x-ray that showed mild periosteal elevation at the resection site with the possibility of osteomyelitis. He currently denies systemic signs of infection. He has been taking doxycycline and Augmentin without issues. He is getting his INR checked by his primary care office. 12/18; patient presents for follow-up. He has been using Dakin's wet-to-dry dressings to the left foot wound. We have been placing Grafix under compression therapy to the right lower  extremity. He is currently taking doxycycline and Augmentin. He denies signs of infection. 12/26; patient presents for follow-up. He has been using Dakin's wet-to-dry dressings to the left foot. He has been off oral antibiotics for potential bone biopsy. We have been placing Grafix under compression therapy to the right lower extremity. There has been improvement in wound healing to both sites. He states he is trying to aggressively offload the left foot. He currently denies signs of infection. Patient History Information obtained from Chart. Family History Diabetes - Mother, Heart Disease - Mother,Siblings. Social History Current every day smoker, Alcohol Use - Never, Drug Use - No History, Caffeine Use - Never. Medical History Respiratory Patient has history of Sleep Apnea - does not wear Cardiovascular Patient has history of Arrhythmia - A. Fib, Congestive Heart Failure - EF 25%, Hypertension, Peripheral Arterial Disease Denies history of Peripheral Venous Disease Endocrine Patient has history of Type II Diabetes Hospitalization/Surgery History - Angiogram 06/11/2022 Dr. Carlis Abbott [facility]. - cardioversion 11/06/2021, 2015, 2017. 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 Medical A Surgical History Notes nd Gastrointestinal Ascites Genitourinary stage III Kidney disease Objective Constitutional respirations regular, non-labored and within target range for patient.. Vitals Time Taken: 11:25 AM, Height: 68 in, Weight: 185 lbs, BMI: 28.1, Temperature: 97.9 F, Pulse: 89 bpm, Respiratory Rate: 20 breaths/min, Blood Pressure: 156/108 mmHg. Cardiovascular 2+ dorsalis pedis/posterior tibialis pulses. Psychiatric pleasant and cooperative. BRIANNA, ESSON A ([id number]) [id number] Page 9 of 12 General Notes: Right lower extremity: T the posterior aspect there is An open wound with granulation tissue and slough. Left lower extremity: T the plantar o o aspect of the fifth  met head there is an open wound with granulation tissue and nonviable tissue. Wound probes to bone but there is no longer obvious exposed bone. No purulent drainage. Venous stasis dermatitis and lymphedema skin changes bilaterally. Integumentary (Hair, Skin) Wound #1 status is Open. Original cause of wound was Surgical Injury. The date acquired was: 07/16/2022. The wound has been in treatment 15 weeks. The wound is located on the Right Achilles. The wound measures 3.2cm length x 0.2cm width x 0.1cm depth; 0.503cm^2 area and 0.05cm^3 volume. There is Fat Layer (Subcutaneous Tissue) exposed. There is no tunneling or undermining noted. There is a medium amount of serosanguineous drainage noted. The wound margin is distinct with the outline attached to the wound base. There is large (67-100%) red granulation within the wound bed. There is no necrotic tissue within the  wound bed. The periwound skin appearance exhibited: Dry/Scaly. The periwound skin appearance did not exhibit: Callus, Crepitus, Excoriation, Induration, Rash, Scarring, Maceration, Atrophie Blanche, Cyanosis, Ecchymosis, Hemosiderin Staining, Mottled, Pallor, Rubor, Erythema. Wound #2 status is Open. Original cause of wound was Gradually Appeared. The date acquired was: 03/09/2022. The wound has been in treatment 15 weeks. The wound is located on the Blossom. The wound measures 1cm length x 1.5cm width x 0.3cm depth; 1.178cm^2 area and 0.353cm^3 volume. There is bone and Fat Layer (Subcutaneous Tissue) exposed. There is no tunneling or undermining noted. There is a medium amount of serosanguineous drainage noted. The wound margin is distinct with the outline attached to the wound base. There is medium (34-66%) pink granulation within the wound bed. There is a medium (34-66%) amount of necrotic tissue within the wound bed including Adherent Slough. The periwound skin appearance exhibited: Callus. The periwound skin appearance did not  exhibit: Crepitus, Excoriation, Induration, Rash, Scarring, Dry/Scaly, Maceration, Atrophie Blanche, Cyanosis, Ecchymosis, Hemosiderin Staining, Mottled, Pallor, Rubor, Erythema. 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 I debrided nonviable tissue. Grafix was placed in standard fashion today to the right lower extremity. Continue Kerlix/Coban to this leg. The left lateral foot wound has more granulation tissue present. I am unable to access the area for a bone biopsy but was able to get a deep tissue culture. Will place referral to podiatry, Dr. Jacqualyn Posey for bone biopsy and culture. I recommended staying off of oral antibiotics unless he develops acute infectious symptoms including increased warmth purulent drainage, , erythema, swelling or increased pain we also discussed hyperbaric oxygen therapy as this is a Wagner grade 3. He states he will think about it and let me know at next clinic visit. Procedures Wound #1 Pre-procedure diagnosis of Wound #1 is a Diabetic Wound/Ulcer of the Lower Extremity located on the Right Achilles .Severity of Tissue Pre Debridement is: Fat layer exposed. There was a Excisional Skin/Subcutaneous Tissue Debridement with a total area of 3.2 sq cm performed by [name], DO. With the following instrument(s): Curette to remove Viable and Non-Viable tissue/material. Material removed includes Callus, Subcutaneous Tissue, Slough, Skin: Dermis, and Skin: Epidermis after achieving pain control using Lidocaine 4% Topical Solution. A time out was  conducted at 12:00, prior to the start of the procedure. A Minimum amount of bleeding was controlled with Pressure. The procedure was tolerated well with a pain level of 0 throughout and a pain level of 0 following the procedure. Post Debridement Measurements: 3.2cm length x 0.5cm width x 0.1cm depth; 0.126cm^3 volume. Character of Wound/Ulcer Post Debridement is improved. Severity of Tissue Post Debridement is: Fat layer exposed. Post procedure Diagnosis Wound #1: Same as Pre-Procedure Pre-procedure diagnosis of Wound #1 is a Diabetic Wound/Ulcer of the Lower Extremity located on the Right Achilles. A skin graft procedure using a bioengineered skin substitute/cellular or tissue based product was performed by [name], DO with the following instrument(s): Forceps and Scissors. Grafix prime was applied and secured with Steri-Strips and adaptic. 2 sq cm of product was utilized and 0 sq cm was wasted. Post Application, no dressing was applied. A  Time Out was conducted at 12:08, prior to the start of the procedure. The procedure was tolerated well with a pain level of 0 throughout and a pain level of 0 following the procedure. Post procedure Diagnosis Wound #1: Same as Pre-Procedure . Plan Follow-up Appointments: Return Appointment in 1 week. - w/ Dr. Heber St.  Return Appointment in 2 weeks. - Dr. Heber [place] Other: - deep tissue culture taken. If any worsening of wound/signs of infection/ redness/swelling/purulent drainage you need to start antibiotics back and call us. Anesthetic: (In clinic) Topical Lidocaine 5% applied to wound bed Cellular or Tissue Based Products: Cellular or Tissue Based Product Type: - Grafix approved-waiting to hear about copay or coinsurance-cost for you out of your pocket. Kerecsis denied by insurance. Grafix #1 applied on 09/24/22 Grafix #2 applied on 09/29/2022 Grafix # 3 applied on 10/07/22 Grafix # 4 applied on 10/19/22 Grafix # 5 applied 10/26/2022 Grafix applied  to right achilles wound 11/03/2022 Cellular or Tissue Based Product applied to wound bed, secured with steri-strips, cover with Adaptic or Mepitel. (DO NOT REMOVE). Edema Control - Lymphedema / SCD / OtherRAMON, ZANDERS A ([id number]) [id number] Page 10 of 12 Avoid standing for long periods of time. Off-Loading: Open toe surgical shoe to: - ****put PegAssist in surgical shoe.**** Prevalon Boot - left foot while resting in bed at night. Other: - minimize walking and standing to aid in offloading pressure to wound. Consults ordered were: Podiatry - ****UR[facility]ENT****[facility] Dr.Wagner Referral to podiatry for bone biopsy of left lateral foot wound. Laboratory ordered were: Aerobic culture-specimen not specified - deep tissue culture of left lateral foot wound. WOUND #1: - Achilles Wound Laterality: Right Cleanser: Soap and Water 1 x Per Week/30 Days Discharge Instructions: May shower and wash wound with dial antibacterial soap and water prior to dressing change. Cleanser: Wound Cleanser 1 x Per Week/30 Days Discharge Instructions: Cleanse the wound with wound cleanser prior to applying a clean dressing using gauze sponges, not tissue or cotton balls. Peri-Wound Care: Sween Lotion (Moisturizing lotion) 1 x Per Week/30 Days Discharge Instructions: Apply moisturizing lotion as directed Prim Dressing: Grafix 1 x Per Week/30 Days ary Discharge Instructions: secure with adaptic and steri strips Secondary Dressing: ABD Pad, 5x9 1 x Per Week/30 Days Discharge Instructions: Apply over primary dressing as directed. Secondary Dressing: Woven Gauze Sponge, Non-Sterile 4x4 in 1 x Per Week/30 Days Discharge Instructions: Apply over primary dressing as directed. Com pression Wrap: Kerlix Roll 4.5x3.1 (in/yd) 1 x Per Week/30 Days Discharge Instructions: Apply Kerlix and Coban compression as directed. Com pression Wrap: Coban Self-Adherent Wrap 4x5 (in/yd) 1 x Per  Week/30 Days Discharge Instructions: Apply over Kerlix as directed. WOUND #2: - Foot Wound Laterality: Plantar, Left Cleanser: Dakins Solution 1 x Per Day/30 Days Peri-Wound Care: Skin Prep 1 x Per Day/30 Days Discharge Instructions: Use skin prep as directed Prim Dressing: Dakin's Solution 0.25%, 16 (oz) 1 x Per Day/30 Days ary Discharge Instructions: Moisten gauze with Dakin's solution Secondary Dressing: Optifoam Non-Adhesive Dressing, 4x4 in 1 x Per Day/30 Days Discharge Instructions: Apply over as a foam donut. Secondary Dressing: Woven Gauze Sponges 2x2 in 1 x Per Day/30 Days Discharge Instructions: Apply over primary dressing as directed. Secured With: [employer], 4.5x3.1 (in/yd) 1 x Per Day/30 Days Discharge Instructions: Secure with Kerlix as directed. Secured With: 20M Medipore H Soft Cloth Surgical T ape, 4 x 10 (in/yd) 1 x Per Day/30 Days Discharge Instructions: Secure with tape as directed. 1. Right lower extremityooin office  sharp debridement, Grafix placed in standard fashion, Kerlix/Coban 2. Left lateral foot, deep tissue culture, referral to podiatry for bone culture/biopsy, continue Dakin's wet-to-dry dressings, aggressive offloadingoosurgical shoe with peg assist. Prevalon boot at night 3. Follow-up in 1 week Electronic Signature(s) Signed: 11/03/2022 3:18:07 PM By: [name] DO Entered By: [name] on 11/03/2022 12:51:34 -------------------------------------------------------------------------------- HxROS Details Patient Name: Date of Service: [name] [name] 11/03/2022 11:15 A M Medical Record Number: [id number] Patient Account Number: [id number] Date of Birth/Sex: Treating RN: 09-10-1951 (71 y.o. M) Primary Care Provider: [name] Other Clinician: Referring Provider: Treating Provider/Extender: [name] Ngetich, [name] in Treatment: 15 Information Obtained From Chart Respiratory Medical History: Positive  for: Sleep Apnea - does not wear Cardiovascular Medical History: Positive for: Arrhythmia - A. Fib; Congestive Heart Failure - EF 25%; Hypertension; Peripheral Arterial Disease Negative for: Peripheral Venous Disease ISAHIA, HOLLERBACH A ([id number]) [id number] Page 11 of 12 Gastrointestinal Medical History: Past Medical History Notes: Ascites Endocrine Medical History: Positive for: Type II Diabetes Time with diabetes: 6 years Treated with: Insulin Blood sugar tested every day: No Genitourinary Medical History: Past Medical History Notes: stage III Kidney disease Immunizations Pneumococcal Vaccine: Received Pneumococcal Vaccination: No Implantable Devices No devices added Hospitalization / Surgery History Type of Hospitalization/Surgery Angiogram 06/11/2022 Dr. 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 11/03/2022 Medical Record Number: [id number] Patient Account Number: [id number] Date of Birth/Sex: Treating RN: 22-Oct-1951 (71 y.o. 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 Facility Procedures : MARVYN TORREZ Code: [id number] , Coyle A ([id number] Description: Q4133- Grafix PL 16 mm disc (2 units) 80) [id number] Modifier: [id number] Quantity: 2 f Page 12 of 12 : CPT4 Code: [id number] IC L Description: 818 - SKIN SUB GRAFT TRNK/ARM/LEG D-10 Diagnosis Description 97.813 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 Modifier: 1 Quantity: Physician Procedures : CPT4 Code Description Modifier [id number] 02637 - WC PHYS LEVEL 3 - EST PT ICD-10 Diagnosis Description L97.522 Non-pressure chronic ulcer of other part of left foot with fat layer exposed E11.621 Type 2 diabetes mellitus with foot ulcer I73.9 Peripheral  vascular disease, unspecified Quantity: 1 : [id number] - WC PHYS SKIN SUB GRAFT TRNK/ARM/LEG ICD-10 Diagnosis Description 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

## 2022-11-04 NOTE — Progress Notes (Addendum)
Darren [name], Darren [name] ([id number]) [id number] Page 1 of 8 Visit Report for 11/03/2022 Arrival Information Details Patient Name: Date of Service: Darren [name] [facility] [name]. 11/03/2022 11:15 [name]: [id number] Patient Account Number: [id number] Date of Birth/Sex: Treating RN: 16-May-1951 (71 y.Darren. M) Primary Care Darren [name]: Darren [name] Other Clinician: Referring Darren [name]: Treating Darren [name]/Extender: Darren [name], Darren [name] in Treatment: 15 Visit Information History Since Last Visit All ordered tests and consults were completed: No Patient Arrived: Crutches Added or deleted any medications: No Arrival Time: 11:27 Any new allergies or adverse reactions: No Accompanied By: family Had [name] fall or experienced change in No Transfer Assistance: None activities of daily living that may affect Patient Identification Verified: Yes risk of falls: Secondary Verification Process Completed: Yes Signs or symptoms of abuse/neglect since last visito No Patient Requires Transmission-Based Precautions: No Hospitalized since last visit: No Patient Has Alerts: Yes Implantable device outside of the clinic excluding No Patient Alerts: Patient [name] Blood Thinner cellular tissue based products placed in the center since last visit: Pain Present Now: No Electronic Signature(s) Signed: 11/03/2022 1:45:12 PM By: Darren [name] Entered By: Darren [name] [name] 11/03/2022 11:27:52 -------------------------------------------------------------------------------- Complex / Palliative Patient Assessment Details Patient Name: Date of Service: Darren [name] [facility] [name]. 11/03/2022 11:15 [name]: [id number] Patient Account Number: [id number] Date of Birth/Sex: Treating RN: 02-Oct-1951 (16 y.Darren. [name] Primary Care Zoltan Genest: Darren [name] Other Clinician: Referring Lynden Flemmer: Treating Anamae [name]/Extender: Darren [name], Darren [name] in  Treatment: 15 Complex Wound Management Criteria Patient has remarkable or complex co-morbidities requiring medications or treatments that extend wound healing times. Examples: Diabetes mellitus with chronic renal failure or end stage renal disease requiring dialysis Advanced or poorly controlled rheumatoid arthritis Diabetes mellitus and end stage chronic obstructive pulmonary disease Active cancer with current chemo- or radiation therapy osteomyelitis, DM type II, neuropathy, CKD stage III, CHF EF 25%, [name]. fib, PAD, HTN Palliative Wound Management Criteria Care Approach Wound Care Plan: Complex Wound Management Electronic Signature(s) Signed: 11/06/2022 4:52:04 PM By: Darren [name] Signed: 12/28/2022 4:03:05 PM By: Darren Shan DO Entered By: Darren [name] [name] 11/06/2022 16:22:28 Darren [name] ([id number] Page 2 of 8 -------------------------------------------------------------------------------- Encounter Discharge Information Details Patient Name: Date of Service: Darren [name] [facility] [name]. 11/03/2022 11:15 [name]: [id number] Patient Account Number: [id number] Date of Birth/Sex: Treating RN: 09/06/1951 (26 y.Darren. [name] Primary Care Aldon Hengst: Darren [name] Other Clinician: Referring Hyrum Shaneyfelt: Treating Darren [name]/Extender: Darren [name], Darren [name] in Treatment: 15 Encounter Discharge Information Items Post Procedure Vitals Discharge Condition: Stable Temperature (F): 97.9 Ambulatory Status: Cane Pulse (bpm): 89 Discharge Destination: Home Respiratory Rate (breaths/min): 20 Transportation: Private Auto Blood Pressure (mmHg): 156/108 Accompanied By: family Schedule Follow-up Appointment: Yes Clinical Summary of Care: Electronic Signature(s) Signed: 11/03/2022 5:26:14 PM By: Darren [name] Entered By: Darren [name] [name] 11/03/2022  17:03:05 -------------------------------------------------------------------------------- Lower Extremity Assessment Details Patient Name: Date of Service: Darren [name] [facility] [name]. 11/03/2022 11:15 [name]: [id number] Patient Account Number: [id number] Date of Birth/Sex: Treating RN: 1951/01/16 (45 y.Darren. [name] Primary Care Kendle Erker: Darren [name] Other Clinician: Referring Darren [name]: Treating Darren [name]/Extender: Darren [name], Darren [name] in Treatment: 15 Edema Assessment Assessed: [Left: No] [Right: No] Edema: [Left: No] [Right: No] Calf Left: Right: Point of Measurement: 39 cm From Medial Instep 38.5 cm 36.5 cm Ankle Left: Right: Point of Measurement: 8 cm From Medial Instep 26.5 cm 25  cm Electronic Signature(s) Signed: 11/03/2022 1:12:16 PM By: Darren [name] Entered By: Darren [name] [name] 11/03/2022 12:06:07 Multi Wound Chart Details -------------------------------------------------------------------------------- Darren [name] ([id number] Page 3 of 8 Patient Name: Date of Service: Darren [name] [facility] [name]. 11/03/2022 11:15 [name]: [id number] Patient Account Number: [id number] Date of Birth/Sex: Treating RN: [birth year] (56 y.Darren. M) Primary Care Kennethia Lynes: Darren [name] Other Clinician: Referring Terren Jandreau: Treating Elliott Lasecki/Extender: Darren [name], Darren [name] in Treatment: 15 Vital Signs Height(in): 68 Pulse(bpm): 89 Weight(lbs): 185 Blood Pressure(mmHg): 156/108 Body Mass Index(BMI): 28.1 Temperature(F): 97.9 Respiratory Rate(breaths/min): 20 Wound Assessments Wound Number: 1 2 N/[name] Photos: N/[name] Right Achilles Left, Plantar Foot N/[name] Wound Location: Surgical Injury Gradually Appeared N/[name] Wounding Event: Diabetic Wound/Ulcer of the Lower Diabetic Wound/Ulcer of the Lower N/[name] Primary Etiology: Extremity Extremity Sleep Apnea, Arrhythmia, Congestive Sleep  Apnea, Arrhythmia, Congestive N/[name] Comorbid History: Heart Failure, Hypertension, Peripheral Heart Failure, Hypertension, Peripheral Arterial Disease, Type II Diabetes Arterial Disease, Type II Diabetes 07/16/2022 03/09/2022 N/[name] Date Acquired: 15 15 N/[name] [name] of Treatment: Open Open N/[name] Wound Status: No No N/[name] Wound Recurrence: 3.2x0.2x0.1 1x1.5x0.3 N/[name] Measurements L x W x D (cm) 0.503 1.178 N/[name] [name] (cm) : rea 0.05 0.353 N/[name] Volume (cm) : 97.50% -435.50% N/[name] % Reduction in [name] rea: 99.50% -434.80% N/[name] % Reduction in Volume: Grade 2 Grade 3 N/[name] Classification: N/[name] X-Ray N/[name] Wagner Verification: Medium Medium N/[name] Exudate [name] mount: Serosanguineous Serosanguineous N/[name] Exudate Type: red, brown red, brown N/[name] Exudate Color: Distinct, outline attached Distinct, outline attached N/[name] Wound Margin: Large (67-100%) Medium (34-66%) N/[name] Granulation [name] mount: Red Pink N/[name] Granulation Quality: None Present (0%) Medium (34-66%) N/[name] Necrotic [name] mount: Fat Layer (Subcutaneous Tissue): Yes Fat Layer (Subcutaneous Tissue): Yes N/[name] Exposed Structures: Fascia: No Bone: Yes Tendon: No Fascia: No Muscle: No Tendon: No Joint: No Muscle: No Bone: No Joint: No None Small (1-33%) N/[name] Epithelialization: Debridement - Excisional N/[name] N/[name] Debridement: Pre-procedure Verification/Time Out 12:00 N/[name] N/[name] Taken: Lidocaine 4% Topical Solution N/[name] N/[name] Pain Control: Callus, Subcutaneous, Slough N/[name] N/[name] Tissue Debrided: Skin/Subcutaneous Tissue N/[name] N/[name] Level: 3.2 N/[name] N/[name] Debridement [name] (sq cm): rea Curette N/[name] N/[name] Instrument: Minimum N/[name] N/[name] Bleeding: Pressure N/[name] N/[name] Hemostasis [name] chieved: 0 N/[name] N/[name] Procedural Pain: 0 N/[name] N/[name] Post Procedural Pain: Procedure was tolerated well N/[name] N/[name] Debridement Treatment Response: 3.2x0.5x0.1 N/[name] N/[name] Post Debridement Measurements L x W x D (cm) 0.126 N/[name] N/[name] Post Debridement Volume: (cm) Excoriation: No Callus: Yes N/[name] Periwound Skin  Texture: Induration: No Excoriation: No Callus: No Induration: No Crepitus: No Crepitus: No Rash: No Rash: No Scarring: No Scarring: No Dry/Scaly: Yes Maceration: No N/[name] Periwound Skin Moisture: Maceration: No Dry/Scaly: No Atrophie Blanche: No Atrophie Blanche: No N/[name] Periwound Skin Color: Darren [name], Darren [name] [id number] Page 4 of 8 Cyanosis: No Cyanosis: No Ecchymosis: No Ecchymosis: No Erythema: No Erythema: No Hemosiderin Staining: No Hemosiderin Staining: No Mottled: No Mottled: No Pallor: No Pallor: No Rubor: No Rubor: No Cellular or Tissue Based Product N/[name] N/[name] Procedures Performed: Debridement Treatment Notes Electronic Signature(s) Signed: 11/03/2022 3:18:07 PM By: Darren Shan DO Entered By: Darren Shan [name] 11/03/2022 12:35:44 -------------------------------------------------------------------------------- Multi-Disciplinary Care Plan Details Patient Name: Date of Service: Darren [name] [facility] [name]. 11/03/2022 11:15 [name]: [id number] Patient Account Number: [id number] Date of Birth/Sex: Treating RN: 07/02/1951 (14 y.Darren. [name] Primary Care Elius Etheredge: Darren [name] Other Clinician: Referring Jolea Dolle: Treating Advay Volante/Extender: Darren [name], Darren [name] in Treatment:  15 Active Inactive Nutrition Nursing Diagnoses: Potential for alteratiion in Nutrition/Potential for imbalanced nutrition Goals: Patient/caregiver agrees to and verbalizes understanding of need to use nutritional supplements and/or vitamins as prescribed Date Initiated: 07/16/2022 Target Resolution Date: 11/07/2022 Goal Status: Active Patient/caregiver will maintain therapeutic glucose control Date Initiated: 07/16/2022 Target Resolution Date: 11/07/2022 Goal Status: Active Interventions: Assess HgA1c results as ordered upon admission and as needed Provide education [name] nutrition Treatment  Activities: Education provided [name] Nutrition : 10/08/2022 Obtain HgA1c : 07/16/2022 Patient referred to Primary Care Physician for further nutritional evaluation : 07/16/2022 Notes: Pain, Acute or Chronic Nursing Diagnoses: Pain, acute or chronic: actual or potential Potential alteration in comfort, pain Goals: Patient will verbalize adequate pain control and receive pain control interventions during procedures as needed Date Initiated: 07/16/2022 Target Resolution Date: 11/07/2022 Goal Status: Active Interventions: Encourage patient to take pain medications as prescribed Provide education [name] pain management Reposition patient for comfort Darren [name], Darren [name] ([id number] Page 5 of 8 Treatment Activities: Administer pain control measures as ordered : 07/16/2022 Notes: Electronic Signature(s) Signed: 11/03/2022 1:12:16 PM By: Darren [name] Entered By: Darren [name] [name] 11/03/2022 12:21:20 -------------------------------------------------------------------------------- Pain Assessment Details Patient Name: Date of Service: Darren [name] [facility] [name]. 11/03/2022 11:15 [name]: [id number] Patient Account Number: [id number] Date of Birth/Sex: Treating RN: December 19, 1950 (60 y.Darren. M) Primary Care Chameka Mcmullen: Darren [name] Other Clinician: Referring Iwao Shamblin: Treating Asiah Browder/Extender: Darren [name], Darren [name] in Treatment: 15 Active Problems Location of Pain Severity and Description of Pain Patient Has Paino No Site Locations Pain Management and Medication Current Pain Management: Electronic Signature(s) Signed: 11/03/2022 1:45:12 PM By: Darren [name] Entered By: Darren [name] [name] 11/03/2022 11:28:27 -------------------------------------------------------------------------------- Patient/Caregiver Education Details Patient Name: Date of Service: Darren [name] [facility] [name]. 12/26/2023andnbsp11:15 [name]:  [id number] Patient Account Number: [id number] Date of Birth/Gender: Treating RN: [birth year] (22 y.Darren. [name] Primary Care Physician: Darren [name] Other Clinician: Referring Physician: Treating Physician/Extender: [name], Darren [name] in Treatment: [address], [place] [name] ([id number]) [id number] Page 6 of 8 Education Assessment Education Provided To: Patient Education Topics Provided Wound/Skin Impairment: Handouts: Caring for Your Ulcer Methods: Explain/Verbal Responses: Reinforcements needed Electronic Signature(s) Signed: 11/03/2022 1:12:16 PM By: Darren [name] Entered By: Darren [name] [name] 11/03/2022 12:21:35 -------------------------------------------------------------------------------- Wound Assessment Details Patient Name: Date of Service: Darren [name] [facility] [name]. 11/03/2022 11:15 [name]: [id number] Patient Account Number: [id number] Date of Birth/Sex: Treating RN: Sep 17, 1951 (69 y.Darren. M) Primary Care Fong Mccarry: Darren [name] Other Clinician: Referring Leemon Ayala: Treating Sanad Fearnow/Extender: Darren [name], Darren [name] in Treatment: 15 Wound Status Wound Number: 1 Primary Diabetic Wound/Ulcer of the Lower Extremity Etiology: Wound Location: Right Achilles Wound Open Wounding Event: Surgical Injury Status: Date Acquired: 07/16/2022 Comorbid Sleep Apnea, Arrhythmia, Congestive Heart Failure, Hypertension, [name] Of Treatment: 15 History: Peripheral Arterial Disease, Type II Diabetes Clustered Wound: No Photos Wound Measurements Length: (cm) 3.2 Width: (cm) 0.2 Depth: (cm) 0.1 Area: (cm) 0.503 Volume: (cm) 0.05 % Reduction in Area: 97.5% % Reduction in Volume: 99.5% Epithelialization: None Tunneling: No Undermining: No Wound Description Classification: Grade 2 Wound Margin: Distinct, outline attached Exudate Amount: Medium Exudate Type: Serosanguineous Exudate Color: red,  brown Foul Odor After Cleansing: No Slough/Fibrino No Wound Bed Granulation Amount: Large (67-100%) Exposed Structure Darren [name], Darren [name] ([id number] Page 7 of 8 Granulation Quality: Red Fascia Exposed: No Necrotic Amount: None Present (0%) Fat Layer (Subcutaneous Tissue) Exposed: Yes Tendon Exposed: No  Muscle Exposed: No Joint Exposed: No Bone Exposed: No Periwound Skin Texture Texture Color No Abnormalities Noted: No No Abnormalities Noted: No Callus: No Atrophie Blanche: No Crepitus: No Cyanosis: No Excoriation: No Ecchymosis: No Induration: No Erythema: No Rash: No Hemosiderin Staining: No Scarring: No Mottled: No Pallor: No Moisture Rubor: No No Abnormalities Noted: No Dry / Scaly: Yes Maceration: No Electronic Signature(s) Signed: 11/03/2022 1:12:16 PM By: Darren [name] Entered By: Darren [name] [name] 11/03/2022 12:06:28 -------------------------------------------------------------------------------- Wound Assessment Details Patient Name: Date of Service: Darren [name] Darren [name]. 11/03/2022 11:15 [name]: [id number] Patient Account Number: [id number] Date of Birth/Sex: Treating RN: 1951/05/14 (44 y.Darren. M) Primary Care Diem Dicocco: Darren [name] Other Clinician: Referring Cadi Rhinehart: Treating Lanyiah Brix/Extender: Darren [name], Darren [name] in Treatment: 15 Wound Status Wound Number: 2 Primary Diabetic Wound/Ulcer of the Lower Extremity Etiology: Wound Location: Left, Plantar Foot Wound Open Wounding Event: Gradually Appeared Status: Date Acquired: 03/09/2022 Comorbid Sleep Apnea, Arrhythmia, Congestive Heart Failure, Hypertension, [name] Of Treatment: 15 History: Peripheral Arterial Disease, Type II Diabetes Clustered Wound: No Photos Wound Measurements Length: (cm) 1 Width: (cm) 1.5 Depth: (cm) 0.3 Area: (cm) 1.178 Volume: (cm) 0.353 % Reduction in Area: -435.5% % Reduction in  Volume: -434.8% Epithelialization: Small (1-33%) Tunneling: No Undermining: No Wound Description Classification: Grade 3 Darren [name], Darren [name] ([id number]) Earleen Newport Verification: X-Ray Wound Margin: Distinct, outline attach Exudate Amount: Medium Exudate Type: Serosanguineous Exudate Color: red, brown Foul Odor After Cleansing: No [id number] Page 8 of 8 Slough/Fibrino No ed Wound Bed Granulation Amount: Medium (34-66%) Exposed Structure Granulation Quality: Pink Fascia Exposed: No Necrotic Amount: Medium (34-66%) Fat Layer (Subcutaneous Tissue) Exposed: Yes Necrotic Quality: Adherent Slough Tendon Exposed: No Muscle Exposed: No Joint Exposed: No Bone Exposed: Yes Periwound Skin Texture Texture Color No Abnormalities Noted: No No Abnormalities Noted: No Callus: Yes Atrophie Blanche: No Crepitus: No Cyanosis: No Excoriation: No Ecchymosis: No Induration: No Erythema: No Rash: No Hemosiderin Staining: No Scarring: No Mottled: No Pallor: No Moisture Rubor: No No Abnormalities Noted: No Dry / Scaly: No Maceration: No Electronic Signature(s) Signed: 11/03/2022 1:12:16 PM By: Darren [name] Entered By: Darren [name] [name] 11/03/2022 12:07:34 -------------------------------------------------------------------------------- Vitals Details Patient Name: Date of Service: Darren [name] Darren [name]. 11/03/2022 11:15 [name]: [id number] Patient Account Number: [id number] Date of Birth/Sex: Treating RN: 09/07/1951 (23 y.Darren. M) Primary Care Cristian Davitt: Darren [name] Other Clinician: Referring Starnisha Batrez: Treating Zhara Gieske/Extender: Darren [name], Darren [name] in Treatment: 15 Vital Signs Time Taken: 11:25 Temperature (F): 97.9 Height (in): 68 Pulse (bpm): 89 Weight (lbs): 185 Respiratory Rate (breaths/min): 20 Body Mass Index (BMI): 28.1 Blood Pressure (mmHg): 156/108 Reference Range: 80 - 120 mg / dl Electronic  Signature(s) Signed: 11/03/2022 1:45:12 PM By: Darren [name] Entered By: Darren [name] [name] 11/03/2022 11:28:19

## 2022-11-06 LAB — AEROBIC CULTURE W GRAM STAIN (SUPERFICIAL SPECIMEN): Gram Stain: NONE SEEN

## 2022-11-10 ENCOUNTER — Encounter (HOSPITAL_BASED_OUTPATIENT_CLINIC_OR_DEPARTMENT_OTHER): Payer: Medicare HMO | Attending: Internal Medicine | Admitting: Internal Medicine

## 2022-11-10 ENCOUNTER — Encounter: Payer: Self-pay | Admitting: Podiatry

## 2022-11-10 ENCOUNTER — Ambulatory Visit (INDEPENDENT_AMBULATORY_CARE_PROVIDER_SITE_OTHER): Payer: Medicare HMO | Admitting: Podiatry

## 2022-11-10 ENCOUNTER — Ambulatory Visit (INDEPENDENT_AMBULATORY_CARE_PROVIDER_SITE_OTHER): Payer: Medicare HMO

## 2022-11-10 DIAGNOSIS — E11621 Type 2 diabetes mellitus with foot ulcer: Secondary | ICD-10-CM

## 2022-11-10 DIAGNOSIS — L97424 Non-pressure chronic ulcer of left heel and midfoot with necrosis of bone: Secondary | ICD-10-CM

## 2022-11-10 DIAGNOSIS — L97512 Non-pressure chronic ulcer of other part of right foot with fat layer exposed: Secondary | ICD-10-CM | POA: Diagnosis not present

## 2022-11-10 DIAGNOSIS — I739 Peripheral vascular disease, unspecified: Secondary | ICD-10-CM | POA: Diagnosis not present

## 2022-11-10 DIAGNOSIS — L97813 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: Secondary | ICD-10-CM | POA: Diagnosis not present

## 2022-11-10 DIAGNOSIS — M86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, left ankle and foot: Secondary | ICD-10-CM

## 2022-11-10 DIAGNOSIS — E1151 Type 2 diabetes mellitus with diabetic peripheral angiopathy without gangrene: Secondary | ICD-10-CM | POA: Insufficient documentation

## 2022-11-10 DIAGNOSIS — E11622 Type 2 diabetes mellitus with other skin ulcer: Secondary | ICD-10-CM | POA: Insufficient documentation

## 2022-11-10 DIAGNOSIS — E1169 Type 2 diabetes mellitus with other specified complication: Secondary | ICD-10-CM | POA: Insufficient documentation

## 2022-11-10 DIAGNOSIS — I5022 Chronic systolic (congestive) heart failure: Secondary | ICD-10-CM | POA: Insufficient documentation

## 2022-11-10 DIAGNOSIS — E1140 Type 2 diabetes mellitus with diabetic neuropathy, unspecified: Secondary | ICD-10-CM | POA: Insufficient documentation

## 2022-11-10 DIAGNOSIS — L97422 Non-pressure chronic ulcer of left heel and midfoot with fat layer exposed: Secondary | ICD-10-CM | POA: Diagnosis not present

## 2022-11-10 DIAGNOSIS — I110 Hypertensive heart disease with heart failure: Secondary | ICD-10-CM | POA: Insufficient documentation

## 2022-11-10 DIAGNOSIS — L97522 Non-pressure chronic ulcer of other part of left foot with fat layer exposed: Secondary | ICD-10-CM | POA: Diagnosis not present

## 2022-11-10 NOTE — Progress Notes (Signed)
Subjective:  Patient ID: [name], male    DOB: 1951/04/24,   MRN: [id number]  Chief Complaint  Patient presents with   Foot Pain   Wound Check    Bone biopsy     72 y.o. male presents for follow-up of right leg wound and left foot wound. Patient has been following with vascular and wound care. Has had improvement in right leg wound being treated by wound care. Has had exposed bone on left foot wound and here today for bone biopsy to help guide antibiotics. Has been on  doxycycline and augmentin and stopped for the past week for potential bone biopsy.   Denies any other pedal complaints. Denies n/v/f/c.   Past Medical History:  Diagnosis Date   A-fib [facility])    a. (06/04/14) TEE-DC-CV; succesful; large LA 6.2 cm   ADHD    Ascites    Athlete's foot    Chronic systolic heart failure ([place])    a. 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 CFT <3 seconds. Normal hair growth on digits. No edema.  Skin. No lacerations or abrasions bilateral feet. Right foot dressed in compression followed by wound care. . Pictures appear improved.   Left fifth metatarsal wound  0.5 cm x 0.5 cm x 0.5 cm. No erythema edema or purulence noted. Probes to bone.  Musculoskeletal: MMT 5/5 bilateral lower extremities in DF, PF, Inversion and Eversion. Deceased ROM in DF of ankle joint.  Neurological: Sensation intact to light touch.   ABIs:   Summary:  Right: Resting right ankle-brachial index indicates moderate right lower   extremity arterial disease. The right toe-brachial index is abnormal.   Left: Resting left ankle-brachial index indicates mild left lower  extremity arterial disease. The left toe-brachial index is abnormal.   Assessment:   1. 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 Ulcer right posterior ankle with muscle exposed, necorsis. Left fifth metatarsal wound with fat layer exposed and necrosis of bone.  X-rays reviewed and discussed with patient. No acute fractures or dislocations noted. Increased osseous destruction of fifth metatarsal noted from previous x-ray. Destruction noted to mid diaphysis  -No debridement on right  Left foot wound debrided as below. Bone biopsy preformed procedure below.  -Off-loading with surgical shoe on left. -Continue with wound care. Will restart doxycycline and augmentin prescribed from them. Pateint relates they have new prescription at home.  -Discussed with patient osteomyelitis and presence of bone infection in left fifth metatarsal. Discussed risk of limb loss and infection. Discussed options of surgery to remove bone vs bone biopsy and long term antibiotics. Patient would like to continue with antibiotics. Will refer to ID for antibioitcs management  -Referral to ID placed.  -Dr. Carlis Abbott preformed angiogram 8/3 with intervention to open up flow in right foot. Continuing to follow.  -Patient will follow-up  with wound care for chronic non-healing wound and possible need for hyperbarics.  -Discussed glucose control and proper protein-rich diet.  -Discussed if any worsening redness, pain, fever or chills to call or may need to report to the emergency room. Patient expressed understanding.  Discussed with location of wound and possible infection and limited blood flow patient is at risk for limb loss.Expressed understanding.     Procedure: Excisional Debridement of  Wound and bone biopsy  Area cleansed thoroughly in aseptic manner.  Rationale: Removal of non-viable soft tissue from the wound to promote healing.  Anesthesia: none Pre-Debridement Wound Measurements: 0.5 cm x 0.5 cm x 0.5 cm left  Post-Debridement Wound Measurements: 0.5 cm x 0.5 cm x 0.5 cm left  Type of Debridement: Sharp Excisional Tissue Removed: Non-viable soft tissue Depth of Debridement: subcutaneous tissue. Technique: Sharp excisional debridement to bleeding, viable wound base. Small piece of soft bone removed and sent for culture. Will follow.  Dressing: Dry, sterile, compression dressing. Disposition: Patient tolerated procedure well. Patient to return in 3 week for follow-up.  Return in about 3 weeks (around 12/01/2022) for wound check.     Return in about 3 weeks (around 12/01/2022) for wound check.   Lorenda Peck, DPM

## 2022-11-10 NOTE — Addendum Note (Signed)
Addended by: Zebedee Iba on: 11/10/2022 11:08 AM   Modules accepted: Orders

## 2022-11-11 NOTE — Progress Notes (Signed)
LENG, MONTESDEOCA [name] ([id number]) [id number] Page 1 of 7 Visit Report for 11/10/2022 Arrival Information Details Patient Name: Date of Service: [name] [facility] [name]. 11/10/2022 2:15 PM Medical Record Number: [id number] Patient Account Number: [id number] Date of Birth/Sex: Treating RN: 02/04/1951 (72 y.Darren. [name] Primary Care [name]: [name] Other Clinician: Referring [name]: Treating [name]/Extender: [name], [name] in Treatment: 16 Visit Information History Since Last Visit All ordered tests and consults were completed: Yes Patient Arrived: Crutches Added or deleted any medications: No Arrival Time: 14:37 Any new allergies or adverse reactions: No Accompanied By: daughter and granddaughter Had [name] fall or experienced change in No activities of daily living that may affect Transfer Assistance: None risk of falls: Patient Identification Verified: Yes Signs or symptoms of abuse/neglect since No Secondary Verification Process Completed: Yes last visito Patient Requires Transmission-Based No Hospitalized since last visit: No Precautions: Implantable device outside of the clinic No Patient Has Alerts: Yes excluding Patient Alerts: Patient on Blood Thinner cellular tissue based products placed in the center since last visit: Has Dressing in Place as Prescribed: Yes Has Footwear/Offloading in Place as Yes Prescribed: Left: Surgical Shoe with Pressure Relief Insole Right: Surgical Shoe with Pressure Relief Insole Pain Present Now: Yes Notes see Dr. Ralene [name] today for bone biopsy. per Dr. Mikey [name] leave dressing in place since biopsy and debridement took place [name] couple of hours ago at podiatry. Electronic Signature(s) Signed: 11/10/2022 5:53:54 PM By: Darren Stall RN, BSN Entered By: [name] on 11/10/2022 14:47:17 -------------------------------------------------------------------------------- Encounter Discharge  Information Details Patient Name: Date of Service: [name]. 11/10/2022 2:15 PM Medical Record Number: [id number] Patient Account Number: [id number] Date of Birth/Sex: Treating RN: [birth year] (10 y.Darren. [name] Primary Care [name]: [name] Other Clinician: Referring Angila Wombles: Treating [name]/Extender: [name], [name] in Treatment: 16 Encounter Discharge Information Items Post Procedure Vitals Discharge Condition: Stable Temperature (F): 97.8 Ambulatory Status: Crutches Pulse (bpm): 125 Discharge Destination: Home Respiratory Rate (breaths/min): 20 Transportation: Private Auto Blood Pressure (mmHg): 131/84 Accompanied By: daughter Schedule Follow-up Appointment: Yes Clinical Summary of Care: [name], [name] ([id number]) [id number].pdf Page 2 of 7 Electronic Signature(s) Signed: 11/10/2022 5:53:54 PM By: Darren Stall RN, BSN Entered By: [name] on 11/10/2022 15:39:08 -------------------------------------------------------------------------------- Lower Extremity Assessment Details Patient Name: Date of Service: [name]. 11/10/2022 2:15 PM Medical Record Number: [id number] Patient Account Number: [id number] Date of Birth/Sex: Treating RN: [birth year] (51 y.Darren. [name] Primary Care [name]: [name] Other Clinician: Referring [name]: Treating Latreece Mochizuki/Extender: [name], [name] in Treatment: 16 Edema Assessment Assessed: [Left: No] [Right: Yes] Edema: [Left: No] [Right: No] Calf Left: Right: Point of Measurement: 39 cm From Medial Instep 35 cm Ankle Left: Right: Point of Measurement: 8 cm From Medial Instep 23 cm Vascular Assessment Pulses: Dorsalis Pedis Palpable: [Right:Yes] Electronic Signature(s) Signed: 11/10/2022 5:53:54 PM By: Darren Stall RN, BSN Entered By: [name] on 11/10/2022  14:43:32 -------------------------------------------------------------------------------- Multi Wound Chart Details Patient Name: Date of Service: [name]. 11/10/2022 2:15 PM Medical Record Number: [id number] Patient Account Number: [id number] Date of Birth/Sex: Treating RN: 1951/01/07 (60 y.Darren. M) Primary Care Camarie Mctigue: [name] Other Clinician: Referring [name]: Treating [name]/Extender: [name], [name] in Treatment: 16 Vital Signs Height(in): 68 Pulse(bpm): 125 Weight(lbs): 185 Blood Pressure(mmHg): 131/84 Body Mass Index(BMI): 28.1 Temperature(F): 97.8 Respiratory Rate(breaths/min): 20 [1:Photos: No Photos Right Achilles Wound Location:] [N/[name]:N/[name] N/[name]] [name] ([id number]) [  1:Surgical Injury Wounding Event: Diabetic Wound/Ulcer of the Lower Primary Etiology: Extremity Sleep Apnea, Arrhythmia, Congestive N/[name] Comorbid History: Heart Failure, Hypertension, Peripheral Arterial Disease, Type II Diabetes 07/16/2022 Date Acquired:  16 [name] of Treatment: Open Wound Status: No Wound Recurrence: 2.5x0.4x0.1 Measurements L x W x D (cm) 0.785 [name] (cm) : rea 0.079 Volume (cm) : 96.20% % Reduction in [name] rea: 99.20% % Reduction in Volume: Grade 2 Classification: Medium Exudate [name] mount:  Serosanguineous Exudate Type: red, brown Exudate Color: Distinct, outline attached Wound Margin: Large (67-100%) Granulation [name] mount: Red Granulation Quality: Small (1-33%) Necrotic [name] mount: Fat Layer (Subcutaneous Tissue): Yes N/[name] Exposed Structures:  Fascia: No Tendon: No Muscle: No Joint: No Bone: No Large (67-100%) Epithelialization: Excoriation: No Periwound Skin Texture: Induration: No Callus: No Crepitus: No Rash: No Scarring: No Dry/Scaly: Yes Periwound Skin Moisture: Maceration: No Atrophie  Blanche: No Periwound Skin Color: Cyanosis: No Ecchymosis: No Erythema: No Hemosiderin Staining: No Mottled: No Pallor: No Rubor: No] [N/[name]:N/[name] N/[name] N/[name] N/[name] N/[name] N/[name] N/[name] N/[name]  N/[name] N/[name] N/[name] N/[name] N/[name] N/[name] N/[name] N/[name] N/[name] N/[name] N/[name] N/[name] N/[name] N/[name] N/[name]] Treatment Notes Electronic Signature(s) Signed: 11/10/2022 3:41:44 PM By: [name] DO Entered By: [name] on 11/10/2022 14:47:03 -------------------------------------------------------------------------------- Multi-Disciplinary Care Plan Details Patient Name: Date of Service: [name], [name]. 11/10/2022 2:15 PM Medical Record Number: [id number] Patient Account Number: [id number] Date of Birth/Sex: Treating RN: 14-Sep-1951 (23 y.Darren. [name] Primary Care Chandra Feger: [name] Other Clinician: Referring Nakeda Lebron: Treating Rhyder Bratz/Extender: [name] [name], [name] in Treatment: 16 Active Inactive Nutrition Nursing Diagnoses: Potential for alteratiion in Nutrition/Potential for imbalanced nutrition Goals: Patient/caregiver agrees to and verbalizes understanding of need to use nutritional supplements and/or vitamins as prescribed THEDORE, PICKEL [name] ([id number]) [id number] Page 4 of 7 Date Initiated: 07/16/2022 Target Resolution Date: 12/11/2022 Goal Status: Active Patient/caregiver will maintain therapeutic glucose control Date Initiated: 07/16/2022 Target Resolution Date: 12/11/2022 Goal Status: Active Interventions: Assess HgA1c results as ordered upon admission and as needed Provide education on nutrition Treatment Activities: Education provided on Nutrition : 10/19/2022 Obtain HgA1c : 07/16/2022 Patient referred to Primary Care Physician for further nutritional evaluation : 07/16/2022 Notes: Pain, Acute or Chronic Nursing Diagnoses: Pain, acute or chronic: actual or potential Potential alteration in comfort, pain Goals: Patient will verbalize adequate pain control and receive pain control interventions during procedures as needed Date Initiated: 07/16/2022 Target Resolution Date: 12/11/2022 Goal Status: Active Interventions: Encourage patient to take pain  medications as prescribed Provide education on pain management Reposition patient for comfort Treatment Activities: Administer pain control measures as ordered : 07/16/2022 Notes: Electronic Signature(s) Signed: 11/10/2022 5:53:54 PM By: Deon Pilling RN, BSN Entered By: Deon Pilling on 11/10/2022 14:54:29 -------------------------------------------------------------------------------- Pain Assessment Details Patient Name: Date of Service: [name]. 11/10/2022 2:15 PM Medical Record Number: [id number] Patient Account Number: [id number] Date of Birth/Sex: Treating RN: 07/07/1951 (52 y.Darren. [name] Primary Care Wyonia Fontanella: [name] Other Clinician: Referring Taylia Berber: Treating Jamone Garrido/Extender: [name] [name], [name] in Treatment: 16 Active Problems Location of Pain Severity and Description of Pain Patient Has Paino Yes Site Locations Pain Location: [name], [name] ([id number]) [id number] Page 5 of 7 Pain Location: Generalized Pain, Pain in Ulcers Rate the pain. Current Pain Level: 6 Pain Management and Medication Current Pain Management: Medication: No Cold Application: No Rest: No Massage: No Activity: No T.E.N.S.: No Heat Application: No Leg drop or elevation: No Is the Current Pain Management Adequate: Adequate How does  your wound impact your activities of daily livingo Sleep: No Bathing: No Appetite: No Relationship With Others: No Bladder Continence: No Emotions: No Bowel Continence: No Work: No Toileting: No Drive: No Dressing: No Hobbies: No Electronic Signature(s) Signed: 11/10/2022 5:53:54 PM By: Deon Pilling RN, BSN Entered By: Deon Pilling on 11/10/2022 14:38:55 -------------------------------------------------------------------------------- Patient/Caregiver Education Details Patient Name: Date of Service: Darren Dy [name]. 1/2/2024andnbsp2:15 PM Medical Record Number:  [id number] Patient Account Number: [id number] Date of Birth/Gender: Treating RN: [birth year] (79 y.Darren. [name] Primary Care Physician: [name] Other Clinician: Referring Physician: Treating Physician/Extender: [name] in Treatment: 16 Education Assessment Education Provided To: Patient Education Topics Provided Wound/Skin Impairment: Handouts: Caring for Your Ulcer Methods: Explain/Verbal Responses: Reinforcements needed Electronic Signature(s) Signed: 11/10/2022 5:53:54 PM By: Deon Pilling RN, BSN Entered By: Deon Pilling on 11/10/2022 14:54:41 [name] ([id number]) [id number] Page 6 of 7 -------------------------------------------------------------------------------- Wound Assessment Details Patient Name: Date of Service: Darren DRO [name] [facility] [name]. 11/10/2022 2:15 PM Medical Record Number: [id number] Patient Account Number: [id number] Date of Birth/Sex: Treating RN: June 13, 1951 (82 y.Darren. [name] Primary Care Vedanth Sirico: [name] Other Clinician: Referring Jordie Skalsky: Treating Adalin Vanderploeg/Extender: [name] [name], [name] in Treatment: 16 Wound Status Wound Number: 1 Primary Diabetic Wound/Ulcer of the Lower Extremity Etiology: Wound Location: Right Achilles Wound Open Wounding Event: Surgical Injury Status: Date Acquired: 07/16/2022 Comorbid Sleep Apnea, Arrhythmia, Congestive Heart Failure, Hypertension, [name] Of Treatment: 16 History: Peripheral Arterial Disease, Type II Diabetes Clustered Wound: No Photos Wound Measurements Length: (cm) Width: (cm) Depth: (cm) Area: (cm) Volume: (cm) 2.5 % Reduction in Area: 96.2% 0.4 % Reduction in Volume: 99.2% 0.1 Epithelialization: Large (67-100%) 0.785 Tunneling: No 0.079 Undermining: No Wound Description Classification: Grade 2 Wound Margin: Distinct, outline attached Exudate Amount: Medium Exudate Type:  Serosanguineous Exudate Color: red, brown Foul Odor After Cleansing: No Slough/Fibrino Yes Wound Bed Granulation Amount: Large (67-100%) Exposed Structure Granulation Quality: Red Fascia Exposed: No Necrotic Amount: Small (1-33%) Fat Layer (Subcutaneous Tissue) Exposed: Yes Necrotic Quality: Adherent Slough Tendon Exposed: No Muscle Exposed: No Joint Exposed: No Bone Exposed: No Periwound Skin Texture Texture Color No Abnormalities Noted: No No Abnormalities Noted: No Callus: No Atrophie Blanche: No Crepitus: No Cyanosis: No Excoriation: No Ecchymosis: No Induration: No Erythema: No Rash: No Hemosiderin Staining: No Scarring: No Mottled: No Pallor: No Moisture Rubor: No No Abnormalities Noted: No Dry / Scaly: Yes Maceration: No MAYKEL, REITTER [name] ([id number]) [id number] Page 7 of 7 Treatment Notes Wound #1 (Achilles) Wound Laterality: Right Cleanser Soap and Water Discharge Instruction: May shower and wash wound with dial antibacterial soap and water prior to dressing change. Wound Cleanser Discharge Instruction: Cleanse the wound with wound cleanser prior to applying [name] clean dressing using gauze sponges, not tissue or cotton balls. Peri-Wound Care Sween Lotion (Moisturizing lotion) Discharge Instruction: Apply moisturizing lotion as directed Topical Primary Dressing Hydrofera Blue Ready Transfer Foam, 2.5x2.5 (in/in) Discharge Instruction: Apply directly to wound bed as directed Secondary Dressing ABD Pad, 5x9 Discharge Instruction: Apply over primary dressing as directed. Woven Gauze Sponge, Non-Sterile 4x4 in Discharge Instruction: Apply over primary dressing as directed. Secured With Compression Wrap Kerlix Roll 4.5x3.1 (in/yd) Discharge Instruction: Apply Kerlix and Coban compression as directed. Coban Self-Adherent Wrap 4x5 (in/yd) Discharge Instruction: Apply over Kerlix as directed. Compression  Stockings Add-Ons Electronic Signature(s) Signed: 11/10/2022 5:53:54 PM By: Deon Pilling RN, BSN Entered By: Deon Pilling on 11/10/2022 14:47:49 -------------------------------------------------------------------------------- Vitals Details Patient Name: Date of Service:  [name]. 11/10/2022 2:15 PM Medical Record Number: [id number] Patient Account Number: [id number] Date of Birth/Sex: Treating RN: 04/10/1951 (72 y.Darren. [name] Primary Care Lasheika Ortloff: [name] Other Clinician: Referring Rayleen Wyrick: Treating Amel Gianino/Extender: [name], [name] in Treatment: 16 Vital Signs Time Taken: 14:38 Temperature (F): 97.8 Height (in): 68 Pulse (bpm): 125 Weight (lbs): 185 Respiratory Rate (breaths/min): 20 Body Mass Index (BMI): 28.1 Blood Pressure (mmHg): 131/84 Reference Range: 80 - 120 mg / dl Electronic Signature(s) Signed: 11/10/2022 5:53:54 PM By: Darren Stall RN, BSN Entered By: [name] on 11/10/2022 14:43:15

## 2022-11-11 NOTE — Progress Notes (Signed)
ORVIL, FARAONE A ([id number]) [id number] Page 1 of 12 Visit Report for 11/10/2022 Chief Complaint Document Details Patient Name: Date of Service: [name] [facility] A. 11/10/2022 2:15 PM Medical Record Number: [id number] Patient Account Number: [id number] Date of Birth/Sex: Treating RN: [birth year] (72 y.o. M) Primary Care Provider: [name] Other Clinician: Referring Provider: Treating Provider/Extender: [name], [name] in Treatment: 16 Information Obtained from: Patient Chief Complaint 07/16/2022; bilateral lower extremity wounds Electronic Signature(s) Signed: 11/10/2022 3:41:44 PM By: [name] DO Entered By: [name] on 11/10/2022 14:47:13 -------------------------------------------------------------------------------- Debridement Details Patient Name: Date of Service: [name] 11/10/2022 2:15 PM Medical Record Number: [id number] Patient Account Number: [id number] Date of Birth/Sex: Treating RN: 1951-07-02 (72 y.o. [name] Primary Care Provider: [name] Other Clinician: Referring Provider: Treating Provider/Extender: [name] Ngetich, [name] in Treatment: 16 Debridement Performed for Assessment: Wound #1 Right Achilles Performed By: Physician [name], DO Debridement Type: Debridement Severity of Tissue Pre Debridement: Fat layer exposed Level of Consciousness (Pre-procedure): Awake and Alert Pre-procedure Verification/Time Out Yes - 14:50 Taken: Start Time: 14:51 Pain Control: Lidocaine 4% T opical Solution T Area Debrided (L x W): otal 2.5 (cm) x 0.4 (cm) = 1 (cm) Tissue and other material debrided: Viable, Non-Viable, Slough, Subcutaneous, Skin: Dermis , Skin: Epidermis, Slough Level: Skin/Subcutaneous Tissue Debridement Description: Excisional Instrument: Curette Bleeding: Minimum Hemostasis Achieved: Pressure End Time: 14:55 Procedural Pain:  0 Post Procedural Pain: 0 Response to Treatment: Procedure was tolerated well Level of Consciousness (Post- Awake and Alert procedure): Post Debridement Measurements of Total Wound Length: (cm) 2.5 Width: (cm) 0.4 Depth: (cm) 0.1 Volume: (cm) 0.079 Character of Wound/Ulcer Post Debridement: Improved Severity of Tissue Post Debridement: Fat layer exposed RAMIRO, PANGILINAN A ([id number]) [id number] Page 2 of 12 Post Procedure Diagnosis Same as Pre-procedure Electronic Signature(s) Signed: 11/10/2022 3:41:44 PM By: [name] DO Signed: 11/10/2022 5:53:54 PM By: Deon Pilling RN, BSN Entered By: Deon Pilling on 11/10/2022 14:55:29 -------------------------------------------------------------------------------- HPI Details Patient Name: Date of Service: [name] 11/10/2022 2:15 PM Medical Record Number: [id number] Patient Account Number: [id number] Date of Birth/Sex: Treating RN: November 30, 1950 (72 y.o. M) Primary Care Provider: [name] Other Clinician: Referring Provider: Treating Provider/Extender: [name], [name] in Treatment: 16 History of Present Illness HPI Description: Admission 07/16/2022 Mr. 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 He is not sure how the wounds started. He has been following with podiatry for this issue. He had removal of the tendon to the right lower extremity by Dr. SBlenda Mounts He has been using Betadine to the wound beds. On 06/11/2022 he had a right posterior tibial artery angioplasty by Dr. CCarlis Abbott It was reported the patient  is optimized after revascularization of the right lower extremity. His ABIs on the left were 0.91. He currently denies systemic signs of infection. He also does not wear shoes. He came in with Kerlix wrap to his feet bilaterally. This did not completely cover his feet. 07/23/2022: This is a patient of Dr. HJodene Namthat she asked me to take a look at last week due to the significant involvement of the muscle and  tendon on his right posterior leg wound. He needed an aggressive debridement and asked me if I would be able to perform this on her behalf. The patient is here today for that procedure. When his dressing was removed in clinic today, the wound was teeming with maggots. There is necrotic muscle, tendon, and fat, with a thick layer of slough on the wound. 9/21; patient presents for follow-up. He was debrided by Dr. Celine Ahr at last clinic visit without any issues. He has been placing Dakin's wet-to-dry dressings to the right posterior wound. He has been using Medihoney to the left foot wound. He reports improvement in wound healing. He has no issues or complaints today. 9/28; patient with type 2 diabetes PAD status post revascularization. He underwent retrograde right PTA angioplasty. Last saw Dr. Carlis Abbott on 9/12 and was felt to have a brisk posterior tibial signal at the right ankle. He had a pulsatile toe tracing that appeared adequate for healing. He has been using Santyl and Hydrofera Blue on the right Achilles area. He has a smaller area on the left fifth plantar metatarsal head They were apparently in the ER on 9/23 with swelling and discoloration above the wrap. They have a picture of the leg after the wrap was taken off which looks like that it was excessively tight superiorly 10/6; patient presents for follow-up. We have been using Santyl and Hydrofera Blue to the right lower extremity wound under Kerlix/Coban. T the left foot we o have been using silver alginate with Medihoney. He again  comes in with no shoes. He has no issues or complaints today. 10/12; patient presents for follow-up. We have been using Santyl and Hydrofera Blue to the right lower extremity under Kerlix/Coban And silver alginate to the left foot wound. He has no issues today. He denies signs of infection. 10/19; patient presents for follow-up. We continue to use Santyl and Hydrofera Blue to the right lower extremity under Kerlix/Coban and silver alginate to the left foot wound. There is been improvement in wound healing. 10/26; patient presents for follow-up. We have been using Santyl and Hydrofera Blue to the right lower extremity under Kerlix/Coban and silver alginate to the left foot. There continues to be improvement in wound healing. Patient has no issues or complaints today. 11/2; the patient's area on the right Achilles heel looks quite healthy and is improved in terms of measurements. We have been using Hydrofera Blue. He is approved for Grafix On the left plantar foot wound over the fifth metatarsal head this tunnels over the lateral part of the met head. He is not offloading this at all 11/16; patient presents for follow-up. He has been using a surgical shoe with an offloading felt pad to the left lateral foot along with silver alginate. He has been approved for Grafix and this is available for placement today T the right lower extremity wound. Patient Is agreeable to this. We have been using Santyl and o Hydrofera Blue under Kerlix/Coban to this area. 11/21; patient presents for follow-up. We have been doing Grafix to the right lower extremity and silver alginate to the left foot wound. He has no issues or complaints today. 11/30; patient presents for follow-up. We have been doing Grafix to the right lower extremity wound and Medihoney with Hydrofera Blue to the left lateral foot wound. He reports pain to the left foot wound today. He did not obtain his x-ray. 12/11; patient presents for follow-up. He  has been using Dakin's wet-to-dry dressings to the left  plantar foot wound. We have been doing Grafix to the right lower extremity wound under compression therapy. He obtained his x-ray that showed mild periosteal elevation at the resection site with the possibility of TYRIC, RODEHEAVER A ([id number]) [id number] Page 3 of 12 osteomyelitis. He currently denies systemic signs of infection. He has been taking doxycycline and Augmentin without issues. He is getting his INR checked by his primary care office. 12/18; patient presents for follow-up. He has been using Dakin's wet-to-dry dressings to the left foot wound. We have been placing Grafix under compression therapy to the right lower extremity. He is currently taking doxycycline and Augmentin. He denies signs of infection. 12/26; patient presents for follow-up. He has been using Dakin's wet-to-dry dressings to the left foot. He has been off oral antibiotics for potential bone biopsy. We have been placing Grafix under compression therapy to the right lower extremity. There has been improvement in wound healing to both sites. He states he is trying to aggressively offload the left foot. He currently denies signs of infection. 1/2; patient presents for follow-up. He has been doing Dakin's wet-to-dry dressings to the left foot. He saw Dr. Blenda Mounts today and had a bone biopsy. Debridement was performed on the site. Dressing in place with Dakin's wet-to-dry. T the right leg we have been using Grafix under compression therapy. He o has no issues or complaints today. Electronic Signature(s) Signed: 11/10/2022 3:41:44 PM By: [name] DO Entered By: [name] on 11/10/2022 15:14:05 -------------------------------------------------------------------------------- Physical Exam Details Patient Name: Date of Service: [name] [facility] A. 11/10/2022 2:15 PM Medical Record Number: [id number] Patient Account Number:  [id number] Date of Birth/Sex: Treating RN: 1950-11-23 (72 y.o. M) Primary Care Provider: [name] Other Clinician: Referring Provider: Treating Provider/Extender: [name] Ngetich, [name] in Treatment: 16 Constitutional respirations regular, non-labored and within target range for patient.. Cardiovascular 2+ dorsalis pedis/posterior tibialis pulses. Psychiatric pleasant and cooperative. Notes Right lower extremity: T the posterior aspect there is an open wound with granulation tissue and nonviable tissue. Venous stasis dermatitis. o T the left lower extremity there is a bandage in place to the fifth met head. No signs of surrounding soft tissue infection around the dressing. o Electronic Signature(s) Signed: 11/10/2022 3:41:44 PM By: [name] DO Entered By: [name] on 11/10/2022 15:15:34 -------------------------------------------------------------------------------- Physician Orders Details Patient Name: Date of Service: [name] [name] [facility] A. 11/10/2022 2:15 PM Medical Record Number: [id number] Patient Account Number: [id number] Date of Birth/Sex: Treating RN: Mar 14, 1951 (72 y.o. [name] Primary Care Provider: [name] Other Clinician: Referring Provider: Treating Provider/Extender: [name] Ngetich, [name] in Treatment: 29 Verbal / Phone Orders: No Diagnosis Coding ICD-10 Coding Code Description L97.518 Non-pressure chronic ulcer of other part of right foot with other specified severity MARTIN, BELLING A ([id number]) [id number] Page 4 of 12 [phone] Non-pressure chronic ulcer of other part of left foot with fat layer exposed E11.621 Type 2 diabetes mellitus with foot ulcer I73.9 Peripheral vascular disease, unspecified 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 - w/ Dr. Heber [place] Return  A ppointment in 2 weeks. - Dr. Heber [place] Return A Other: - pick up oral antibiotics start today and continue to take until you see infectious disease. Ask you primary care for provider for pulmonary clearance. Anesthetic (In clinic) Topical Lidocaine 5% applied to wound bed Cellular or Tissue Based Products Cellular or Tissue Based Product Type: - Grafix approved-waiting  to hear about copay or coinsurance-cost for you out of your pocket. Kerecsis denied by insurance. Grafix #1 applied on 09/24/22 Grafix #2 applied on 09/29/2022 Grafix # 3 applied on 10/07/22 Grafix # 4 applied on 10/19/22 Grafix # 5 applied 10/26/2022 Grafix applied to right achilles wound 11/03/2022 Cellular or Tissue Based Product applied to wound bed, secured with steri-strips, cover with Adaptic or Mepitel. (DO NOT REMOVE). Edema Control - Lymphedema / SCD / Other Avoid standing for long periods of time. Off-Loading Open toe surgical shoe to: - ****put PegAssist in surgical shoe.**** Prevalon Boot - left foot while resting in bed at night. Other: - minimize walking and standing to aid in offloading pressure to wound. Hyperbaric Oxygen Therapy Wound #2 Left,Plantar Foot Evaluate for HBO Therapy Indication: - Wagner grade 3 If appropriate for treatment, begin HBOT per protocol: 2.0 ATA for 90 Minutes with 2 Five (5) Minute A Breaks ir Total Number of Treatments: - 40 One treatments per day (delivered Monday through Friday unless otherwise specified in Special Instructions below): Finger stick Blood Glucose Pre- and Post- HBOT Treatment. Follow Hyperbaric Oxygen Glycemia Protocol A frin (Oxymetazoline HCL) 0.05% nasal spray - 1 spray in both nostrils daily as needed prior to HBO treatment for difficulty clearing ears Wound Treatment Wound #1 - Achilles Wound Laterality: Right Cleanser: Soap and Water 1 x Per Week/30 Days Discharge Instructions: May shower and wash wound with dial antibacterial soap and water  prior to dressing change. Cleanser: Wound Cleanser 1 x Per Week/30 Days Discharge Instructions: Cleanse the wound with wound cleanser prior to applying a clean dressing using gauze sponges, not tissue or cotton balls. Peri-Wound Care: Sween Lotion (Moisturizing lotion) 1 x Per Week/30 Days Discharge Instructions: Apply moisturizing lotion as directed Prim Dressing: Hydrofera Blue Ready Transfer Foam, 2.5x2.5 (in/in) 1 x Per Week/30 Days ary Discharge Instructions: Apply directly to wound bed as directed Secondary Dressing: ABD Pad, 5x9 1 x Per Week/30 Days Discharge Instructions: Apply over primary dressing as directed. Secondary Dressing: Woven Gauze Sponge, Non-Sterile 4x4 in 1 x Per Week/30 Days Discharge Instructions: Apply over primary dressing as directed. Compression Wrap: Kerlix Roll 4.5x3.1 (in/yd) 1 x Per Week/30 Days Discharge Instructions: Apply Kerlix and Coban compression as directed. Compression Wrap: Coban Self-Adherent Wrap 4x5 (in/yd) 1 x Per Week/30 Days Discharge Instructions: Apply over Kerlix as directed. [employer] Primary Care Provider - Primary care provider to see patient for clearance of hyperbaric oxygen therapy due to history of a pneumothorax. Patient Medications llergies: No Known Drug Allergies A AHAN, EISENBERGER A ([id number]) [id number] Page 5 of 12 Notifications Medication Indication Start End 11/10/2022 ciprofloxacin HCl DOSE 1 - oral 750 mg tablet - 1 tablet oral every twelve hours x 10 days GLYCEMIA INTERVENTIONS PROTOCOL PRE-HBO GLYCEMIA INTERVENTIONS ACTION INTERVENTION Obtain pre-HBO capillary blood glucose (ensure 1 physician order is in chart). A. Notify HBO physician and await physician orders. 2 If result is 70 mg/dl or below: B. If the result meets the hospital definition of a critical result, follow hospital policy. A. Give patient an 8 ounce Glucerna Shake, an 8 ounce Ensure, or 8 ounces of  a Glucerna/Ensure equivalent dietary supplement*. B. Wait 30 minutes. If result is 71 mg/dl to 130 mg/dl: C. Retest patients capillary blood glucose (CBG). D. If result greater than or equal to 110 mg/dl, proceed with HBO. If result less than 110 mg/dl, notify HBO physician and consider holding HBO. If result is 131 mg/dl to 249 mg/dl: A. Proceed with HBO. A.  Notify HBO physician and await physician orders. B. It is recommended to hold HBO and do If result is 250 mg/dl or greater: blood/urine ketone testing. C. If the result meets the hospital definition of a critical result, follow hospital policy. POST-HBO GLYCEMIA INTERVENTIONS ACTION INTERVENTION Obtain post HBO capillary blood glucose (ensure 1 physician order is in chart). A. Notify HBO physician and await physician orders. 2 If result is 70 mg/dl or below: B. If the result meets the hospital definition of a critical result, follow hospital policy. A. Give patient an 8 ounce Glucerna Shake, an 8 ounce Ensure, or 8 ounces of a Glucerna/Ensure equivalent dietary supplement*. B. Wait 15 minutes for symptoms of If result is 71 mg/dl to 100 mg/dl: hypoglycemia (i.e. nervousness, anxiety, sweating, chills, clamminess, irritability, confusion, tachycardia or dizziness). C. If patient asymptomatic, discharge patient. If patient symptomatic, repeat capillary blood glucose (CBG) and notify HBO physician. If result is 101 mg/dl to 249 mg/dl: A. Discharge patient. A. Notify HBO physician and await physician orders. B. It is recommended to do blood/urine ketone If result is 250 mg/dl or greater: testing. C. If the result meets the hospital definition of a critical result, follow hospital policy. *Juice or candies are NOT equivalent products. If patient refuses the Glucerna or Ensure, please consult the hospital dietitian for an appropriate substitute. Electronic Signature(s) Signed: 11/10/2022 3:41:44 PM By: [name] DO Previous Signature: 11/10/2022 3:23:36 PM Version By: [name] DO Entered By: [name] on 11/10/2022 15:23:46 Prescription 11/10/2022 -------------------------------------------------------------------------------- [name]. [name] DO Patient Name: Provider: [birth year] [id number] Date of Birth: NPI#: Jerilynn Mages [id number] Sex: DEA #: [phone] Phone #: License #: ENDRE, COUTTS ([id number]) [id number] Page 6 of [place] Patient Address: [place] [address] [place], [place] [address] [place], [place] [address] [phone] Allergies No Known Drug Allergies Provider's Orders Primary Care Provider - Primary care provider to see patient for clearance of hyperbaric oxygen therapy due to history of a pneumothorax. Hand Signature: Date(s): Electronic Signature(s) Signed: 11/10/2022 3:41:44 PM By: [name] DO Entered By: [name] on 11/10/2022 15:23:47 -------------------------------------------------------------------------------- Problem List Details Patient Name: Date of Service: [name] [name] [facility] A. 11/10/2022 2:15 PM Medical Record Number: [id number] Patient Account Number: [id number] Date of Birth/Sex: Treating RN: [birth year] (72 y.o. M) Primary Care Provider: [name] Other Clinician: Referring Provider: Treating Provider/Extender: [name], [name] in Treatment: 16 Active Problems ICD-10 Encounter Code Description Active Date MDM Diagnosis L97.518 Non-pressure chronic ulcer of other part of right foot with other specified 07/16/2022 No Yes severity L97.522 Non-pressure chronic ulcer of other part of left foot with fat layer exposed 07/16/2022 No Yes E11.621 Type 2 diabetes mellitus with foot ulcer 07/16/2022 No Yes I73.9 Peripheral vascular disease, unspecified 07/16/2022 No Yes L97.813 Non-pressure  chronic ulcer of other part of right lower leg with necrosis of 07/23/2022 No Yes muscle M86.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, left ankle and foot 11/10/2022 No Yes Inactive Problems Resolved Problems BARRETT, GOLDIE A ([id number]) [id number] Page 7 of 12 Electronic Signature(s) Signed: 11/10/2022 4:00:16 PM By: [name] DO Previous Signature: 11/10/2022 3:41:44 PM Version By: [name] DO Entered By: [name] on 11/10/2022 15:59:45 -------------------------------------------------------------------------------- Progress Note Details Patient Name: Date of Service: [name] 11/10/2022 2:15 PM Medical Record Number: [id number] Patient Account Number: [id number] Date of Birth/Sex: Treating RN: 1951-06-30 (72 y.o. M) Primary Care Provider: [name] Other Clinician:  Referring Provider: Treating Provider/Extender: [name], [name] in Treatment: 16 Subjective Chief Complaint Information obtained from Patient 07/16/2022; bilateral lower extremity wounds History of Present Illness (HPI) Admission 07/16/2022 Mr. 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 He is not sure how the wounds started. He has been following with podiatry for this issue. He had removal of the tendon to the right lower extremity by Dr. SBlenda Mounts He has been using Betadine to the wound beds. On 06/11/2022 he had a right posterior tibial artery angioplasty by Dr. CCarlis Abbott It was reported the patient is optimized after revascularization of the right lower extremity. His ABIs on the left  were 0.91. He currently denies systemic signs of infection. He also does not wear shoes. He came in with Kerlix wrap to his feet bilaterally. This did not completely cover his feet. 07/23/2022: This is a patient of Dr. HJodene Namthat she asked me to take a look at last week due to the significant involvement of the muscle and tendon on his right posterior leg wound. He needed an aggressive debridement and asked me if I would be able to perform this on her behalf. The patient is here today for that procedure. When his dressing was removed in clinic today, the wound was teeming with maggots. There is necrotic muscle, tendon, and fat, with a thick layer of slough on the wound. 9/21; patient presents for follow-up. He was debrided by Dr. CCeline Ahrat last clinic visit without any issues. He has been placing Dakin's wet-to-dry dressings to the right posterior wound. He has been using Medihoney to the left foot wound. He reports improvement in wound healing. He has no issues or complaints today. 9/28; patient with type 2 diabetes PAD status post revascularization. He underwent retrograde right PTA angioplasty. Last saw Dr. CCarlis Abbotton 9/12 and was felt to have a brisk posterior tibial signal at the right ankle. He had a pulsatile toe tracing that appeared adequate for healing. He has been using Santyl and Hydrofera Blue on the right Achilles area. oo He has a smaller area on the left fifth plantar metatarsal head They were apparently in the ER on 9/23 with swelling and discoloration above the wrap. They have a picture of the leg after the wrap was taken off which looks like that it was excessively tight superiorly 10/6; patient presents for follow-up. We have been using Santyl and Hydrofera Blue to the right lower extremity wound under Kerlix/Coban. T the left foot we o have been using silver alginate with Medihoney. He again comes in with no shoes. He has no issues or complaints today. 10/12; patient presents  for follow-up. We have been using Santyl and Hydrofera Blue to the right lower extremity under Kerlix/Coban And silver alginate to the left foot wound. He has no issues today. He denies signs of infection. 10/19; patient presents for follow-up. We continue to use Santyl and Hydrofera Blue to the right lower extremity under Kerlix/Coban and silver alginate to the left foot wound. There is been improvement in wound healing. 10/26; patient presents for follow-up. We have been using Santyl and Hydrofera Blue to the right lower extremity under Kerlix/Coban and silver  alginate to the left foot. There continues to be improvement in wound healing. Patient has no issues or complaints today. 11/2; the patient's area on the right Achilles heel looks quite healthy and is improved in terms of measurements. We have been using Hydrofera Blue. He is approved for Grafix On the left plantar foot wound over the fifth metatarsal head this tunnels over the lateral part of the met head. He is not offloading this at all 11/16; patient presents for follow-up. He has been using a surgical shoe with an offloading felt pad to the left lateral foot along with silver alginate. He has been approved for Grafix and this is available for placement today T the right lower extremity wound. Patient Is agreeable to this. We have been using Santyl and o Hydrofera Blue under Kerlix/Coban to this area. 11/21; patient presents for follow-up. We have been doing Grafix to the right lower extremity and silver alginate to the left foot wound. He has no issues or complaints today. 11/30; patient presents for follow-up. We have been doing Grafix to the right lower extremity wound and Medihoney with Hydrofera Blue to the left lateral foot wound. He reports pain to the left foot wound today. He did not obtain his x-ray. 12/11; patient presents for follow-up. He has been using Dakin's wet-to-dry dressings to the left plantar foot wound. We have  been doing Grafix to the right lower extremity wound under compression therapy. He obtained his x-ray that showed mild periosteal elevation at the resection site with the possibility of osteomyelitis. He currently denies systemic signs of infection. He has been taking doxycycline and Augmentin without issues. He is getting his INR checked EVERT, WENRICH A ([id number]) [id number] Page 8 of 12 by his primary care office. 12/18; patient presents for follow-up. He has been using Dakin's wet-to-dry dressings to the left foot wound. We have been placing Grafix under compression therapy to the right lower extremity. He is currently taking doxycycline and Augmentin. He denies signs of infection. 12/26; patient presents for follow-up. He has been using Dakin's wet-to-dry dressings to the left foot. He has been off oral antibiotics for potential bone biopsy. We have been placing Grafix under compression therapy to the right lower extremity. There has been improvement in wound healing to both sites. He states he is trying to aggressively offload the left foot. He currently denies signs of infection. 1/2; patient presents for follow-up. He has been doing Dakin's wet-to-dry dressings to the left foot. He saw Dr. Blenda Mounts today and had a bone biopsy. Debridement was performed on the site. Dressing in place with Dakin's wet-to-dry. T the right leg we have been using Grafix under compression therapy. He o has no issues or complaints today. Patient History Information obtained from Chart. Family History Diabetes - Mother, Heart Disease - Mother,Siblings. Social History Current every day smoker, Alcohol Use - Never, Drug Use - No History, Caffeine Use - Never. Medical History Respiratory Patient has history of Sleep Apnea - does not wear Cardiovascular Patient has history of Arrhythmia - A. Fib, Congestive Heart Failure - EF 25%, Hypertension, Peripheral Arterial Disease Denies  history of Peripheral Venous Disease Endocrine Patient has history of Type II Diabetes Hospitalization/Surgery History - Angiogram 06/11/2022 Dr. Carlis [facility]. - cardioversion 11/06/2021, 2015, 2017. 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 Medical A Surgical History Notes nd Gastrointestinal Ascites Genitourinary stage III Kidney disease Objective Constitutional respirations regular, non-labored and within target range for patient.. Vitals Time Taken: 2:38 PM, Height: 68 in, Weight:  185 lbs, BMI: 28.1, Temperature: 97.8 F, Pulse: 125 bpm, Respiratory Rate: 20 breaths/min, Blood Pressure: 131/84 mmHg. Cardiovascular 2+ dorsalis pedis/posterior tibialis pulses. Psychiatric pleasant and cooperative. General Notes: Right lower extremity: T the posterior aspect there is an open wound with granulation tissue and nonviable tissue. Venous stasis dermatitis. T o o the left lower extremity there is a bandage in place to the fifth met head. No signs of surrounding soft tissue infection around the dressing. Integumentary (Hair, Skin) Wound #1 status is Open. Original cause of wound was Surgical Injury. The date acquired was: 07/16/2022. The wound has been in treatment 16 weeks. The wound is located on the Right Achilles. The wound measures 2.5cm length x 0.4cm width x 0.1cm depth; 0.785cm^2 area and 0.079cm^3 volume. There is Fat Layer (Subcutaneous Tissue) exposed. There is no tunneling or undermining noted. There is a medium amount of serosanguineous drainage noted. The wound margin is distinct with the outline attached to the wound base. There is large (67-100%) red granulation within the wound bed. There is a small (1-33%) amount of necrotic tissue within the wound bed including Adherent Slough. The periwound skin appearance exhibited: Dry/Scaly. The periwound skin appearance did not exhibit: Callus, Crepitus, Excoriation, Induration, Rash, Scarring, Maceration, Atrophie Blanche, Cyanosis, Ecchymosis,  Hemosiderin Staining, Mottled, Pallor, Rubor, Erythema. 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 I debrided nonviable tissue. Due to the beginning of the year we will run Grafix again for insurance approval. For now I recommended Hydrofera Blue under compression therapy. Patient saw Dr. Blenda Mounts today and had a bone biopsy and debridement. She placed a referral to infectious disease. I obtained a deep wound tissue culture at last clinic visit and this grew Pseudomonas aeruginosa and Serratia marcescens sensitive to ciprofloxacin. I recommended this for the next 1-2 weeks. He can hold off on Augmentin and doxycycline for now. He is interested in proceeding with hyperbaric oxygen therapy for wagner grade III. Risks and benefits were discussed at last clinic visit. He has a history of pneumothorax and noted to have severe emphysema on CT imaging 12/11/2021. He does not have a pulmonologist. I recommended clearance for HBO therapy by his PCP. He has a surgical shoe with a peg assist and uses a Prevalon boot at night for offloading. He has crutches however while watching him walk he uses them as a walker instead of offloading. Procedures Wound #1 Pre-procedure diagnosis of Wound #1 is a Diabetic Wound/Ulcer of the Lower Extremity located on the Right Achilles .Severity of Tissue Pre Debridement is: Fat layer exposed. There was a Excisional Skin/Subcutaneous Tissue Debridement with a total area of 1  sq cm performed by [name], DO. With the following instrument(s): Curette to remove Viable and Non-Viable tissue/material. Material removed includes Subcutaneous Tissue, Slough, Skin: Dermis, and Skin: Epidermis after achieving pain control using Lidocaine 4% Topical Solution. A time out was conducted at 14:50, prior to the start of the procedure. A Minimum amount of bleeding was controlled with Pressure. The procedure was tolerated well with a pain level of 0 throughout and a pain level  of 0 following the procedure. Post Debridement Measurements: 2.5cm length x 0.4cm width x 0.1cm depth; 0.079cm^3 volume. Character of Wound/Ulcer Post Debridement is improved. Severity of Tissue Post Debridement is: Fat layer exposed. Post procedure Diagnosis Wound #1: Same as Pre-Procedure Plan Follow-up Appointments: Return Appointment in 1 week. - w/ Dr. Heber [place] Return Appointment in 2 weeks. - Dr. Heber [place] Other: - pick up oral antibiotics start today and continue to take until you see infectious disease. Ask you primary care for provider for pulmonary clearance. Anesthetic: (In clinic) Topical Lidocaine 5% applied to wound bed Cellular or Tissue Based Products: Cellular or Tissue Based Product Type: - Grafix approved-waiting to hear about copay or coinsurance-cost for you out of your pocket. Kerecsis denied by insurance. Grafix #1 applied on 09/24/22 Grafix #2 applied on 09/29/2022 Grafix # 3 applied on 10/07/22 Grafix # 4 applied on 10/19/22 Grafix # 5 applied 10/26/2022 Grafix applied to right achilles wound 11/03/2022 Cellular or Tissue Based Product applied to wound bed, secured with steri-strips, cover with Adaptic or Mepitel. (DO NOT REMOVE). Edema Control - Lymphedema / SCD / Other: Avoid standing for long periods of time. Off-Loading: Open toe surgical shoe to: - ****put PegAssist in surgical shoe.**** Prevalon Boot - left foot while resting in bed at night. Other: - minimize walking and  standing to aid in offloading pressure to wound. Hyperbaric Oxygen Therapy: Wound #2 Left,Plantar Foot: Evaluate for HBO Therapy Indication: - Wagner grade 3 If appropriate for treatment, begin HBOT per protocol: 2.0 ATA for 90 Minutes with 2 Five (5) Minute Air Breaks T Number of Treatments: - 40 otal One treatments per day (delivered Monday through Friday unless otherwise specified in Special Instructions below): Finger stick Blood Glucose Pre- and Post- HBOT Treatment. Follow Hyperbaric Oxygen Glycemia Protocol Afrin (Oxymetazoline HCL) 0.05% nasal spray - 1 spray in both nostrils daily as needed prior to HBO treatment for difficulty clearing ears ordered were: Primary Care Provider - Primary care provider to see patient for clearance of hyperbaric oxygen therapy due to history of a pneumothorax. The following medication(s) was prescribed: ciprofloxacin HCl oral 750 mg tablet 1 1 tablet oral every twelve hours x 10 days starting 11/10/2022 WOUND #1: - Achilles Wound Laterality: Right Cleanser: Soap and Water 1 x Per Week/30 Days Discharge Instructions: May shower and wash wound with dial antibacterial soap and water prior to dressing change. Cleanser: Wound Cleanser 1 x Per Week/30 Days Discharge Instructions: Cleanse the wound with wound cleanser prior to applying a clean dressing using gauze sponges, not tissue or cotton balls. Peri-Wound Care: Sween Lotion (Moisturizing lotion) 1 x Per Week/30 Days Discharge Instructions: Apply moisturizing lotion as directed Prim Dressing: Hydrofera Blue Ready Transfer Foam, 2.5x2.5 (in/in) 1 x Per Week/30 Days ary Discharge Instructions: Apply directly to wound bed as directed Secondary Dressing: ABD Pad, 5x9 1 x Per Week/30 Days Discharge Instructions: Apply over primary dressing as directed. Secondary Dressing: Woven Gauze Sponge, Non-Sterile 4x4 in 1 x Per Week/30 Days Discharge Instructions: Apply over primary dressing as directed. CROSLEY, STEJSKAL A ([id number]) [id number] Page 10 of 12 Compression Wrap: Kerlix Roll 4.5x3.1 (in/yd) 1 x Per Week/30 Days Discharge Instructions: Apply Kerlix and Coban compression as directed. Compression Wrap: Coban Self-Adherent Wrap 4x5 (in/yd) 1 x Per Week/30 Days Discharge Instructions: Apply over Kerlix as directed. 1. In office sharp debridement 2. Hydrofera Blue under Kerlix/Coban to the right lower extremity 3. Start ciprofloxacin based on culture done to the left fifth met head 4. Follow-up in  1 week 5. Aggressive offloading 6. Run insurance approval for HBO therapy 7. Follow-up with PCP for HBO clearance due to history of pneumothorax Electronic Signature(s) Signed: 11/10/2022 4:00:16 PM By: [name] DO Previous Signature: 11/10/2022 3:41:44 PM Version By: [name] DO Entered By: [name] on 11/10/2022 15:58:45 -------------------------------------------------------------------------------- HxROS Details Patient Name: Date of Service: [name] 11/10/2022 2:15 PM Medical Record Number: [id number] Patient Account Number: [id number] Date of Birth/Sex: Treating RN: 01/29/1951 (72 y.o. M) Primary Care Provider: [name] Other Clinician: Referring Provider: Treating Provider/Extender: [name] Ngetich, [name] in Treatment: 16 Information Obtained From Chart Respiratory Medical History: Positive for: Sleep Apnea - does not wear Cardiovascular Medical History: Positive for: Arrhythmia - A. Fib; Congestive Heart Failure - EF 25%; Hypertension; Peripheral Arterial Disease Negative for: Peripheral Venous Disease Gastrointestinal Medical History: Past Medical History Notes: Ascites Endocrine Medical History: Positive for: Type II Diabetes Time with diabetes: 6 years Treated with: Insulin Blood sugar tested every day: No Genitourinary Medical History: Past Medical History Notes: stage III Kidney  disease Immunizations Pneumococcal Vaccine: Received Pneumococcal Vaccination: No ASHANTI, LITTLES A ([id number]) [id number] Page 11 of 12 Implantable Devices No devices added Hospitalization / Surgery History Type of Hospitalization/Surgery Angiogram 06/11/2022 Dr. 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 11/10/2022 Medical Record Number: [id number] Patient Account Number: [id number] Date of Birth/Sex: Treating RN: 05/22/1951 (72 y.o. 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, left ankle and foot Facility Procedures : CPT4 Code: 97741423 Description: 95320 - DEB SUBQ TISSUE 20 SQ CM/< ICD-10 Diagnosis Description L97.813 Non-pressure chronic ulcer of other part of right lower leg with  necrosis of mu Modifier: scle Quantity: 1 Physician Procedures : CPT4 Code Description Modifier [id number] - WC PHYS LEVEL 3 - EST PT ICD-10 Diagnosis Description L97.522 Non-pressure chronic ulcer of other part of left foot with fat layer exposed E11.621 Type 2 diabetes mellitus with foot ulcer I73.9 Peripheral  vascular disease, unspecified M86.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, left ankle and foot Quantity: 1 : [id number] 11552 - WC PHYS SUBQ TISS 20 SQ CM ICD-10 Diagnosis Description 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

## 2022-11-13 LAB — WOUND CULTURE
MICRO NUMBER:: 14377642
SPECIMEN QUALITY:: ADEQUATE

## 2022-11-16 ENCOUNTER — Other Ambulatory Visit: Payer: Self-pay | Admitting: Family

## 2022-11-16 ENCOUNTER — Other Ambulatory Visit: Payer: Self-pay

## 2022-11-16 DIAGNOSIS — I5042 Chronic combined systolic (congestive) and diastolic (congestive) heart failure: Secondary | ICD-10-CM

## 2022-11-16 MED ORDER — FUROSEMIDE 20 MG PO TABS: ORAL_TABLET | ORAL | 0 refills | Status: DC

## 2022-11-16 NOTE — Telephone Encounter (Signed)
Patient's daughter walked in office requesting refills on Furosemide and warfarin. Is patient to continue taking furosemide and it looks like we do not prescribe warfarin for patient  Message sent to [name], NP

## 2022-11-16 NOTE — Telephone Encounter (Signed)
Warfarin managed by cardiologist. Will refill Furosemide

## 2022-11-17 ENCOUNTER — Encounter (HOSPITAL_BASED_OUTPATIENT_CLINIC_OR_DEPARTMENT_OTHER): Payer: Medicare HMO | Admitting: Internal Medicine

## 2022-11-17 DIAGNOSIS — L97512 Non-pressure chronic ulcer of other part of right foot with fat layer exposed: Secondary | ICD-10-CM | POA: Diagnosis not present

## 2022-11-17 DIAGNOSIS — I739 Peripheral vascular disease, unspecified: Secondary | ICD-10-CM | POA: Diagnosis not present

## 2022-11-17 DIAGNOSIS — L97522 Non-pressure chronic ulcer of other part of left foot with fat layer exposed: Secondary | ICD-10-CM | POA: Diagnosis not present

## 2022-11-17 DIAGNOSIS — E11621 Type 2 diabetes mellitus with foot ulcer: Secondary | ICD-10-CM | POA: Diagnosis not present

## 2022-11-17 DIAGNOSIS — M86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, left ankle and foot: Secondary | ICD-10-CM

## 2022-11-17 DIAGNOSIS — E1151 Type 2 diabetes mellitus with diabetic peripheral angiopathy without gangrene: Secondary | ICD-10-CM | POA: Diagnosis not present

## 2022-11-17 DIAGNOSIS — L97813 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: Secondary | ICD-10-CM

## 2022-11-17 DIAGNOSIS — E1169 Type 2 diabetes mellitus with other specified complication: Secondary | ICD-10-CM | POA: Diagnosis not present

## 2022-11-17 DIAGNOSIS — S81801A Unspecified open wound, right lower leg, initial encounter: Secondary | ICD-10-CM | POA: Diagnosis not present

## 2022-11-17 DIAGNOSIS — E11622 Type 2 diabetes mellitus with other skin ulcer: Secondary | ICD-10-CM | POA: Diagnosis not present

## 2022-11-17 DIAGNOSIS — E1140 Type 2 diabetes mellitus with diabetic neuropathy, unspecified: Secondary | ICD-10-CM | POA: Diagnosis not present

## 2022-11-17 NOTE — Progress Notes (Signed)
[name], [name] ([id number]) [id number] Page 1 of 12 Visit Report for 11/17/2022 Chief Complaint Document Details Patient Name: Date of Service: [name][name] 11/17/2022 10:15 [name] [name] Medical Record Number: [id number] Patient Account Number: [id number] Date of Birth/Sex: Treating RN: 05/10/1951 (72 y.o. [name]) Primary Care Provider: Richarda BladeNgetich, [name] Other Clinician: Referring Provider: Treating Provider/Extender: [name] Ngetich, [name] Weeks in Treatment: 17 Information Obtained from: Patient Chief Complaint 07/16/2022; bilateral lower extremity wounds Electronic Signature(s) Signed: 11/17/2022 11:48:32 AM By: Darren CorwinHoffman, Chriselda Leppert DO Entered By: Darren CorwinHoffman, Saretta Dahlem on 11/17/2022 11:37:16 -------------------------------------------------------------------------------- Debridement Details Patient Name: Date of Service: [name]. 11/17/2022 10:15 [name] [name] Medical Record Number: [id number] Patient Account Number: [id number] Date of Birth/Sex: Treating RN: 05/10/1951 (72 y.o. Darren SoursM) Deaton, Bobbi Primary Care Provider: Richarda BladeNgetich, [name] Other Clinician: Referring Provider: Treating Provider/Extender: Darren CorwinHoffman, Tacy Chavis Ngetich, [name] Weeks in Treatment: 17 Debridement Performed for Assessment: Wound #1 Right Achilles Performed By: Physician Darren CorwinHoffman, Leilanni Halvorson, DO Debridement Type: Debridement Severity of Tissue Pre Debridement: Fat layer exposed Level of Consciousness (Pre-procedure): Awake and Alert Pre-procedure Verification/Time Out Yes - 11:15 Taken: Start Time: 11:16 Pain Control: Lidocaine 4% T opical Solution T Area Debrided (L x W): otal 2.1 (cm) x 1 (cm) = 2.1 (cm) Tissue and other material debrided: Viable, Non-Viable, Eschar, Slough, Subcutaneous, Skin: Dermis , Skin: Epidermis, Slough Level: Skin/Subcutaneous Tissue Debridement Description: Excisional Instrument: Curette Bleeding: Minimum Hemostasis Achieved: Pressure End Time:  11:19 Procedural Pain: 0 Post Procedural Pain: 0 Response to Treatment: Procedure was tolerated well Level of Consciousness (Post- Awake and Alert procedure): Post Debridement Measurements of Total Wound Length: (cm) 2.1 Width: (cm) 0.2 Depth: (cm) 0.1 Volume: (cm) 0.033 Character of Wound/Ulcer Post Debridement: Improved Severity of Tissue Post Debridement: Fat layer exposed [name], [name] ([id number]) [id number] Page 2 of 12 Post Procedure Diagnosis Same as Pre-procedure Electronic Signature(s) Signed: 11/17/2022 11:48:32 AM By: Darren CorwinHoffman, Darren Haithcock DO Signed: 11/17/2022 1:40:02 PM By: [name] Entered By: Darren Stalleaton, Bobbi on 11/17/2022 11:19:40 -------------------------------------------------------------------------------- HPI Details Patient Name: Date of Service: [name]. 11/17/2022 10:15 [name] [name] Medical Record Number: [id number] Patient Account Number: [id number] Date of Birth/Sex: Treating RN: 05/10/1951 (72 y.o. 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 He is not sure how the wounds started. He has been following with podiatry for this issue. He had removal of the tendon to the right lower extremity by Dr. Ralene CorkSikora. He has been using Betadine to the wound beds. On 06/11/2022 he had [name] right posterior tibial artery angioplasty by Dr. Chestine Sporelark. It  was reported the patient is optimized after revascularization of the right lower extremity. His ABIs on the left were 0.91. He currently denies systemic signs of infection. He also does not wear shoes. He came in with Kerlix wrap to his feet bilaterally. This did not completely cover his feet. 07/23/2022: This is [name] patient of Dr. Neita GarnetHoffman's that she asked me to take [name] look at last week due to the significant involvement  of the muscle and tendon on his right posterior leg wound. He needed an aggressive debridement and asked me if I would be able to perform this on her behalf. The patient is here today for that procedure. When his dressing was removed in clinic today, the wound was teeming with maggots. There is necrotic muscle, tendon, and fat, with [name] thick layer of slough on the wound. 9/21; patient presents for follow-up. He was debrided by Dr. Lady Gary at last clinic visit without any issues. He has been placing Dakin's wet-to-dry dressings to the right posterior wound. He has been using Medihoney to the left foot wound. He reports improvement in wound healing. He has no issues or complaints today. 9/28; patient with type 2 diabetes PAD status post revascularization. He underwent retrograde right PTA angioplasty. Last saw Dr. Chestine Spore on 9/12 and was felt to have [name] brisk posterior tibial signal at the right ankle. He had [name] pulsatile toe tracing that appeared adequate for healing. He has been using Santyl and Hydrofera Blue on the right Achilles area. He has [name] smaller area on the left fifth plantar metatarsal head They were apparently in the ER on 9/23 with swelling and discoloration above the wrap. They have [name] picture of the leg after the wrap was taken off which looks like that it was excessively tight superiorly 10/6; patient presents for follow-up. We have been using Santyl and Hydrofera Blue to the right lower extremity wound under Kerlix/Coban. T the left foot we o have been using silver alginate  with Medihoney. He again comes in with no shoes. He has no issues or complaints today. 10/12; patient presents for follow-up. We have been using Santyl and Hydrofera Blue to the right lower extremity under Kerlix/Coban And silver alginate to the left foot wound. He has no issues today. He denies signs of infection. 10/19; patient presents for follow-up. We continue to use Santyl and Hydrofera Blue to the right lower extremity under Kerlix/Coban and silver alginate to the left foot wound. There is been improvement in wound healing. 10/26; patient presents for follow-up. We have been using Santyl and Hydrofera Blue to the right lower extremity under Kerlix/Coban and silver alginate to the left foot. There continues to be improvement in wound healing. Patient has no issues or complaints today. 11/2; the patient's area on the right Achilles heel looks quite healthy and is improved in terms of measurements. We have been using Hydrofera Blue. He is approved for Grafix On the left plantar foot wound over the fifth metatarsal head this tunnels over the lateral part of the met head. He is not offloading this at all 11/16; patient presents for follow-up. He has been using [name] surgical shoe with an offloading felt pad to the left lateral foot along with silver alginate. He has been approved for Grafix and this is available for placement today T the right lower extremity wound. Patient Is agreeable to this. We have been using Santyl and o Hydrofera Blue under Kerlix/Coban to this area. 11/21; patient presents for follow-up. We have been doing Grafix to the right lower extremity and silver alginate to the left foot wound. He has no issues or complaints today. 11/30; patient presents for follow-up. We have been doing Grafix to the right lower extremity wound and Medihoney with Hydrofera Blue to the left lateral foot wound. He reports pain to the left foot wound today. He did not obtain his x-ray. 12/11; patient  presents for follow-up. He has been using Dakin's wet-to-dry  dressings to the left plantar foot wound. We have been doing Grafix to the right lower extremity wound under compression therapy. He obtained his x-ray that showed mild periosteal elevation at the resection site with the possibility of TEDFORD, [name] ([id number]) [id number] Page 3 of 12 osteomyelitis. He currently denies systemic signs of infection. He has been taking doxycycline and Augmentin without issues. He is getting his INR checked by his primary care office. 12/18; patient presents for follow-up. He has been using Dakin's wet-to-dry dressings to the left foot wound. We have been placing Grafix under compression therapy to the right lower extremity. He is currently taking doxycycline and Augmentin. He denies signs of infection. 12/26; patient presents for follow-up. He has been using Dakin's wet-to-dry dressings to the left foot. He has been off oral antibiotics for potential bone biopsy. We have been placing Grafix under compression therapy to the right lower extremity. There has been improvement in wound healing to both sites. He states he is trying to aggressively offload the left foot. He currently denies signs of infection. 1/2; patient presents for follow-up. He has been doing Dakin's wet-to-dry dressings to the left foot. He saw Dr. Ralene Cork today and had [name] bone biopsy. Debridement was performed on the site. Dressing in place with Dakin's wet-to-dry. T the right leg we have been using Grafix under compression therapy. He o has no issues or complaints today. 1/9; patient presents for follow-up. We have been using Hydrofera Blue under compression therapy to the right lower extremity wound. This is well-healing. He has been using Dakin's wet-to-dry dressings to the left lateral foot wound. He has been taking ciprofloxacin due to culture results without issues. He was referred to infectious disease by  podiatry. He does not have an appointment yet. Electronic Signature(s) Signed: 11/17/2022 11:48:32 AM By: Darren Corwin DO Entered By: Darren Corwin on 11/17/2022 11:39:38 -------------------------------------------------------------------------------- Physical Exam Details Patient Name: Date of Service: Darren Crook [facility] [name]. 11/17/2022 10:15 [name] [name] Medical Record Number: [id number] Patient Account Number: [id number] Date of Birth/Sex: Treating RN: [birth year] (72 y.o. [name]) Primary Care Provider: Richarda Blade Other Clinician: Referring Provider: Treating Provider/Extender: Darren Corwin Ngetich, [name] Weeks in Treatment: 17 Constitutional respirations regular, non-labored and within target range for patient.. Cardiovascular 2+ dorsalis pedis/posterior tibialis pulses. Psychiatric pleasant and cooperative. Notes Right lower extremity: T the posterior aspect there is an open wound with granulation tissue and nonviable tissue. Venous stasis dermatitis. T the left lower o o extremity there is An open wound that undermines and probes to bone. No surrounding signs of soft tissue infection. Electronic Signature(s) Signed: 11/17/2022 11:48:32 AM By: Darren Corwin DO Entered By: Darren Corwin on 11/17/2022 11:40:30 -------------------------------------------------------------------------------- Physician Orders Details Patient Name: Date of Service: Darren Crook [facility] [name]. 11/17/2022 10:15 [name] [name] Medical Record Number: [id number] Patient Account Number: [id number] Date of Birth/Sex: Treating RN: 1951-09-03 (72 y.o. [name] Primary Care Provider: Richarda Blade Other Clinician: Referring Provider: Treating Provider/Extender: Jae Dire in Treatment: [phone] Verbal / Phone Orders: No Diagnosis Coding [name], [name] ([id number]) [id number] Page 4 of 12 ICD-10 Coding Code Description L97.518 Non-pressure chronic ulcer of  other part of right foot with other specified severity L97.522 Non-pressure chronic ulcer of other part of left foot with fat layer exposed E11.621 Type 2 diabetes mellitus with foot ulcer I73.9 Peripheral vascular disease, unspecified 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 - w/ Dr. Mikey Bussing 11/24/2022 1015 Return [name] ppointment in 2 weeks. - Dr. Mikey Bussing 12/01/2022 2pm Return [name] Other: - keep Pulmonologist appt at end of February. Call and follow up with Dr. Ralene Cork about bone biopsy and infectious disease appt. pick up the second round of antibiotics. Anesthetic (In clinic) Topical Lidocaine 5% applied to wound bed Cellular or Tissue Based Products Cellular or Tissue Based Product Type: - Grafix approved-waiting to hear about copay or coinsurance-cost for you out of your pocket. Kerecsis denied by insurance. Grafix #1 applied on 09/24/22 Grafix #2 applied on 09/29/2022 Grafix # 3 applied on 10/07/22 Grafix # 4 applied on 10/19/22 Grafix # 5 applied 10/26/2022 Grafix applied to right achilles wound 11/03/2022 Grafix pending insurance approval Cellular or Tissue Based Product applied to wound bed, secured with steri-strips, cover with Adaptic or Mepitel. (DO NOT REMOVE). Edema Control - Lymphedema / SCD / Other Avoid standing for long periods of time. Off-Loading Open toe surgical shoe to: - ****put PegAssist in surgical shoe.**** Prevalon Boot - left foot while resting in bed at night. Other: - minimize walking and standing to aid in offloading pressure to wound. Hyperbaric Oxygen Therapy Wound #2 Left,Plantar Foot Evaluate for HBO Therapy Indication: - Wagner grade 3 If appropriate for treatment, begin HBOT per protocol: 2.0 ATA for 90 Minutes with 2 Five (5) Minute [name] Breaks ir Total Number of Treatments: - 40 One treatments per  day (delivered Monday through Friday unless otherwise specified in Special Instructions below): Finger stick Blood Glucose Pre- and Post- HBOT Treatment. Follow Hyperbaric Oxygen Glycemia Protocol [name] frin (Oxymetazoline HCL) 0.05% nasal spray - 1 spray in both nostrils daily as needed prior to HBO treatment for difficulty clearing ears Wound Treatment Wound #1 - Achilles Wound Laterality: Right Cleanser: Soap and Water 1 x Per Week/30 Days Discharge Instructions: May shower and wash wound with dial antibacterial soap and water prior to dressing change. Cleanser: Wound Cleanser 1 x Per Week/30 Days Discharge Instructions: Cleanse the wound with wound cleanser prior to applying [name] clean dressing using gauze sponges, not tissue or cotton balls. Peri-Wound Care: Sween Lotion (Moisturizing lotion) 1 x Per Week/30 Days Discharge Instructions: Apply moisturizing lotion as directed Prim Dressing: Promogran Prisma Matrix, 4.34 (sq in) (silver collagen) 1 x Per Week/30 Days ary Discharge Instructions: Moisten collagen with saline or hydrogel Secondary Dressing: ABD Pad, 5x9 1 x Per Week/30 Days Discharge Instructions: Apply over primary dressing as directed. Secondary Dressing: Woven Gauze Sponge, Non-Sterile 4x4 in 1 x Per Week/30 Days Discharge Instructions: Apply over primary dressing as directed. Compression Wrap: Kerlix Roll 4.5x3.1 (in/yd) 1 x Per Week/30 Days Discharge Instructions: Apply Kerlix and Coban compression as directed. Compression Wrap: Coban Self-Adherent Wrap 4x5 (in/yd) 1 x Per Week/30 Days Discharge Instructions: Apply over Kerlix as directed. Wound #2 - Foot Wound Laterality: Plantar, Left [name], [name] ([id number]) [id number] Page 5 of 12 Cleanser: Wound Cleanser 1 x Per Day/30 Days Discharge Instructions: Cleanse the wound with wound cleanser prior to applying [name] clean dressing using gauze sponges, not tissue or cotton balls. Peri-Wound Care:  Skin Prep (DME) (Generic) 1 x Per Day/30 Days Discharge Instructions: Use skin prep as directed Prim Dressing: Dakin's Solution 0.25%, 16 (oz) 1 x Per Day/30 Days ary Discharge Instructions: Moisten gauze with Dakin's solution Secondary Dressing: Optifoam Non-Adhesive Dressing, 4x4 in (DME) (Generic) 1 x Per Day/30 Days Discharge Instructions: Apply over primary dressing as directed. Secondary Dressing: Woven Gauze  Sponge, Non-Sterile 4x4 in (DME) (Generic) 1 x Per Day/30 Days Discharge Instructions: Apply over primary dressing as directed. Secondary Dressing: Woven Gauze Sponges 2x2 in (DME) (Generic) 1 x Per Day/30 Days Discharge Instructions: Apply over primary dressing as directed. Secured With: Insurance underwriter, Sterile 2x75 (in/in) (DME) (Generic) 1 x Per Day/30 Days Discharge Instructions: Secure with stretch gauze as directed. Secured With: 39M Medipore H Soft Cloth Surgical T ape, 4 x 10 (in/yd) (DME) (Generic) 1 x Per Day/30 Days Discharge Instructions: Secure with tape as directed. Patient Medications llergies: No Known Drug Allergies [name] Notifications Medication Indication Start End 11/17/2022 levofloxacin DOSE 1 - oral 750 mg tablet - 1 tablet oral every twelve hours x 7 days GLYCEMIA INTERVENTIONS PROTOCOL PRE-HBO GLYCEMIA INTERVENTIONS ACTION INTERVENTION Obtain pre-HBO capillary blood glucose (ensure 1 physician order is in chart). [name]. Notify HBO physician and await physician orders. 2 If result is 70 mg/dl or below: B. If the result meets the hospital definition of [name] critical result, follow hospital policy. [name]. Give patient an 8 ounce Glucerna Shake, an 8 ounce Ensure, or 8 ounces of [name] Glucerna/Ensure equivalent dietary supplement*. B. Wait 30 minutes. If result is 71 mg/dl to 267 mg/dl: C. Retest patients capillary blood glucose (CBG). D. If result greater than or equal to 110 mg/dl, proceed with HBO. If result less than 110 mg/dl, notify  HBO physician and consider holding HBO. If result is 131 mg/dl to 124 mg/dl: [name]. Proceed with HBO. [name]. Notify HBO physician and await physician orders. B. It is recommended to hold HBO and do If result is 250 mg/dl or greater: blood/urine ketone testing. C. If the result meets the hospital definition of [name] critical result, follow hospital policy. POST-HBO GLYCEMIA INTERVENTIONS ACTION INTERVENTION Obtain post HBO capillary blood glucose (ensure 1 physician order is in chart). [name]. Notify HBO physician and await physician orders. 2 If result is 70 mg/dl or below: B. If the result meets the hospital definition of [name] critical result, follow hospital policy. [name]. Give patient an 8 ounce Glucerna Shake, an 8 ounce Ensure, or 8 ounces of [name] Glucerna/Ensure equivalent dietary supplement*. B. Wait 15 minutes for symptoms of If result is 71 mg/dl to 580 mg/dl: hypoglycemia (i.e. nervousness, anxiety, sweating, chills, clamminess, irritability, confusion, tachycardia or dizziness). C. If patient asymptomatic, discharge patient. If patient symptomatic, repeat capillary blood glucose (CBG) and notify HBO physician. If result is 101 mg/dl to 998 mg/dl: [name]. Discharge patient. [name]. Notify HBO physician and await physician [name], [name] ([id number]) [id number] Page 6 of 12 orders. B. It is recommended to do blood/urine ketone If result is 250 mg/dl or greater: testing. C. If the result meets the hospital definition of [name] critical result, follow hospital policy. *Juice or candies are NOT equivalent products. If patient refuses the Glucerna or Ensure, please consult the hospital dietitian for an appropriate substitute. Electronic Signature(s) Signed: 11/17/2022 11:47:38 AM By: Darren Corwin DO Entered By: Darren Corwin on 11/17/2022 11:47:37 -------------------------------------------------------------------------------- Problem List Details Patient Name: Date  of Service: [name] [name] [facility] [name]. 11/17/2022 10:15 [name] [name] Medical Record Number: [id number] Patient Account Number: [id number] Date of Birth/Sex: Treating RN: Aug 20, 1951 (72 y.o. [name] Primary Care Provider: Richarda Blade Other Clinician: Referring Provider: Treating Provider/Extender: Darren Ochs, [name] Weeks in Treatment: 17 Active Problems ICD-10 Encounter Code Description Active Date MDM Diagnosis L97.518 Non-pressure chronic ulcer of other part of right foot with other specified 07/16/2022 No Yes severity L97.522 Non-pressure chronic ulcer  of other part of left foot with fat layer exposed 07/16/2022 No Yes E11.621 Type 2 diabetes mellitus with foot ulcer 07/16/2022 No Yes I73.9 Peripheral vascular disease, unspecified 07/16/2022 No Yes 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 07/23/2022 No Yes muscle M86.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, left ankle and foot 11/10/2022 No Yes Inactive Problems Resolved Problems Electronic Signature(s) Signed: 11/17/2022 11:48:32 AM By: Darren Corwin DO Entered By: Darren Corwin on 11/17/2022 11:29:11 [name] [name] ([id number]) [id number] Page 7 of 12 -------------------------------------------------------------------------------- Progress Note Details Patient Name: Date of Service: [name] [facility] [name]. 11/17/2022 10:15 [name] [name] Medical Record Number: [id number] Patient Account Number: [id number] Date of Birth/Sex: Treating RN: 07-21-1951 (72 y.o. [name]) Primary Care Provider: Richarda Blade Other Clinician: Referring Provider: Treating Provider/Extender: Darren Ochs, [name] Weeks in Treatment: 17 Subjective Chief Complaint Information obtained from Patient 07/16/2022; bilateral lower extremity wounds History of Present Illness (HPI) Admission 07/16/2022 Mr. Javeion Cannedy is [name] of insulin-dependent  currently controlled type 2 diabetes complicated by peripheral neuropathy, chronic systolic heart failure, obstructive sleep apnea, and peripheral vascular disease that presents to the clinic for [name] several month history of nonhealing ulcer to the back of the right leg and [name] 65-month history of nonhealing wounds to the left foot. He is not sure how the wounds started. He has been following with podiatry for this issue. He had removal of the tendon to the right lower extremity by Dr. Ralene Cork. He has been using Betadine to the wound beds. On 06/11/2022 he had [name] right posterior tibial artery angioplasty by Dr. Chestine Spore. It was reported the patient is optimized after revascularization of the right lower extremity. His ABIs on the left were 0.91. He currently denies systemic signs of infection. He also does not wear shoes. He came in with Kerlix wrap to his feet bilaterally. This did not completely cover his feet. 07/23/2022: This is [name] patient of Dr. Neita Garnet that she asked me to take [name] look at last week due to the significant involvement of the muscle and tendon on his right posterior leg wound. He needed an aggressive debridement and asked me if I would be able to perform this on her behalf. The patient is here today for that procedure. When his dressing was removed in clinic today, the wound was teeming with maggots. There is necrotic muscle, tendon, and fat, with [name] thick layer of slough on the wound. 9/21; patient presents for follow-up. He was debrided by Dr. Lady Gary at last clinic visit without any issues. He has been placing Dakin's wet-to-dry dressings to the right posterior wound. He has been using Medihoney to the left foot wound. He reports improvement in wound healing. He has no issues or complaints today. 9/28; patient with type 2 diabetes PAD status post revascularization. He underwent retrograde right PTA angioplasty. Last saw Dr. Chestine Spore on 9/12 and was felt to have [name] brisk posterior tibial signal at  the right ankle. He had [name] pulsatile toe tracing that appeared adequate for healing. He has been using Santyl and Hydrofera Blue on the right Achilles area. oo He has [name] smaller area on the left fifth plantar metatarsal head They were apparently in the ER on 9/23 with swelling and discoloration above the wrap. They have [name] picture of the leg after the wrap was taken off which looks like that it was excessively tight superiorly 10/6; patient presents for follow-up. We have been using  Santyl and Hydrofera Blue to the right lower extremity wound under Kerlix/Coban. T the left foot we o have been using silver alginate with Medihoney. He again comes in with no shoes. He has no issues or complaints today. 10/12; patient presents for follow-up. We have been using Santyl and Hydrofera Blue to the right lower extremity under Kerlix/Coban And silver alginate to the left foot wound. He has no issues today. He denies signs of infection. 10/19; patient presents for follow-up. We continue to use Santyl and Hydrofera Blue to the right lower extremity under Kerlix/Coban and silver alginate to the left foot wound. There is been improvement in wound healing. 10/26; patient presents for follow-up. We have been using Santyl and Hydrofera Blue to the right lower extremity under Kerlix/Coban and silver alginate to the left foot. There continues to be improvement in wound healing. Patient has no issues or complaints today. 11/2; the patient's area on the right Achilles heel looks quite healthy and is improved in terms of measurements. We have been using Hydrofera Blue. He is approved for Grafix On the left plantar foot wound over the fifth metatarsal head this tunnels over the lateral part of the met head. He is not offloading this at all 11/16; patient presents for follow-up. He has been using [name] surgical shoe with an offloading felt pad to the left lateral foot along with silver alginate. He has been approved for Grafix  and this is available for placement today T the right lower extremity wound. Patient Is agreeable to this. We have been using Santyl and o Hydrofera Blue under Kerlix/Coban to this area. 11/21; patient presents for follow-up. We have been doing Grafix to the right lower extremity and silver alginate to the left foot wound. He has no issues or complaints today. 11/30; patient presents for follow-up. We have been doing Grafix to the right lower extremity wound and Medihoney with Hydrofera Blue to the left lateral foot wound. He reports pain to the left foot wound today. He did not obtain his x-ray. 12/11; patient presents for follow-up. He has been using Dakin's wet-to-dry dressings to the left plantar foot wound. We have been doing Grafix to the right lower extremity wound under compression therapy. He obtained his x-ray that showed mild periosteal elevation at the resection site with the possibility of osteomyelitis. He currently denies systemic signs of infection. He has been taking doxycycline and Augmentin without issues. He is getting his INR checked by his primary care office. 12/18; patient presents for follow-up. He has been using Dakin's wet-to-dry dressings to the left foot wound. We have been placing Grafix under compression therapy to the right lower extremity. He is currently taking doxycycline and Augmentin. He denies signs of infection. 12/26; patient presents for follow-up. He has been using Dakin's wet-to-dry dressings to the left foot. He has been off oral antibiotics for potential bone biopsy. We have been placing Grafix under compression therapy to the right lower extremity. There has been improvement in wound healing to both sites. He states he is trying to aggressively offload the left foot. He currently denies signs of infection. [name], [name] ([id number]) [id number] Page 8 of 12 1/2; patient presents for follow-up. He has been doing Dakin's  wet-to-dry dressings to the left foot. He saw Dr. Blenda Mounts today and had [name] bone biopsy. Debridement was performed on the site. Dressing in place with Dakin's wet-to-dry. T the right leg we have been using Grafix under compression therapy. He o has no issues or  complaints today. 1/9; patient presents for follow-up. We have been using Hydrofera Blue under compression therapy to the right lower extremity wound. This is well-healing. He has been using Dakin's wet-to-dry dressings to the left lateral foot wound. He has been taking ciprofloxacin due to culture results without issues. He was referred to infectious disease by podiatry. He does not have an appointment yet. Patient History Information obtained from Chart. Family History Diabetes - Mother, Heart Disease - Mother,Siblings. Social History Current every day smoker, Alcohol Use - Never, Drug Use - No History, Caffeine Use - Never. Medical History Respiratory Patient has history of Sleep Apnea - does not wear Cardiovascular Patient has history of Arrhythmia - [name]. Fib, Congestive Heart Failure - EF 25%, Hypertension, Peripheral Arterial Disease Denies history of Peripheral Venous Disease Endocrine Patient has history of Type II Diabetes Hospitalization/Surgery History - Angiogram 06/11/2022 Dr. Chestine Spore [facility]. - cardioversion 11/06/2021, 2015, 2017. 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 Medical [name] Surgical History Notes nd Gastrointestinal Ascites Genitourinary stage III Kidney disease Objective Constitutional respirations regular, non-labored and within target range for patient.. Vitals Time Taken: 10:47 AM, Height: 68 in, Weight: 185 lbs, BMI: 28.1, Temperature: 97.7 F, Pulse: 123 bpm, Respiratory Rate: 20 breaths/min, Blood Pressure: 129/82 mmHg. Cardiovascular 2+ dorsalis pedis/posterior tibialis pulses. Psychiatric pleasant and cooperative. General Notes: Right lower extremity: T the posterior aspect there is an open wound with  granulation tissue and nonviable tissue. Venous stasis dermatitis. T o o the left lower extremity there is An open wound that undermines and probes to bone. No surrounding signs of soft tissue infection. Integumentary (Hair, Skin) Wound #1 status is Open. Original cause of wound was Surgical Injury. The date acquired was: 07/16/2022. The wound has been in treatment 17 weeks. The wound is located on the Right Achilles. The wound measures 2.1cm length x 0.2cm width x 0.1cm depth; 0.33cm^2 area and 0.033cm^3 volume. There is Fat Layer (Subcutaneous Tissue) exposed. There is no tunneling or undermining noted. There is [name] medium amount of serosanguineous drainage noted. The wound margin is distinct with the outline attached to the wound base. There is large (67-100%) red granulation within the wound bed. There is [name] small (1-33%) amount of necrotic tissue within the wound bed including Adherent Slough. The periwound skin appearance exhibited: Dry/Scaly. The periwound skin appearance did not exhibit: Callus, Crepitus, Excoriation, Induration, Rash, Scarring, Maceration, Atrophie Blanche, Cyanosis, Ecchymosis, Hemosiderin Staining, Mottled, Pallor, Rubor, Erythema. Wound #2 status is Open. Original cause of wound was Gradually Appeared. The date acquired was: 03/09/2022. The wound has been in treatment 17 weeks. The wound is located on the Left,Plantar Foot. The wound measures 0.9cm length x 0.5cm width x 0.2cm depth; 0.353cm^2 area and 0.071cm^3 volume. There is bone and Fat Layer (Subcutaneous Tissue) exposed. There is no tunneling or undermining noted. There is [name] medium amount of serosanguineous drainage noted. The wound margin is distinct with the outline attached to the wound base. There is medium (34-66%) pink granulation within the wound bed. There is [name] medium (34-66%) amount of necrotic tissue within the wound bed including Adherent Slough. The periwound skin appearance exhibited: Callus. The  periwound skin appearance did not exhibit: Crepitus, Excoriation, Induration, Rash, Scarring, Dry/Scaly, Maceration, Atrophie Blanche, Cyanosis, Ecchymosis, Hemosiderin Staining, Mottled, Pallor, Rubor, Erythema. 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 I debrided nonviable tissue. I recommended switching the dressing to collagen and continuing with Kerlix/Coban. The left lateral foot wound has decreased in size however still probes to bone. His bone culture grew Pseudomonas and he is currently on ciprofloxacin. He does not have an appointment with infectious disease yet. I will continue the ciprofloxacin. Continue aggressive offloading. Continue Dakin's wet-to-dry dressings to the lateral foot wound. Follow-up in 1 week. Of note he qualifies for HBO therapy due to [name] Wagner grade 3. He has [name] history of pneumothorax and referral was placed to pulmonology for HBO clearance however he cannot get in until the end of the month. Procedures Wound #1 Pre-procedure diagnosis of Wound #1 is [name] Diabetic Wound/Ulcer of the Lower Extremity located on the Right Achilles .Severity of Tissue Pre Debridement is: Fat layer exposed. There was [name] Excisional Skin/Subcutaneous Tissue Debridement with [name] total area of 2.1 sq cm performed by Darren Corwin, DO. With the following instrument(s): Curette to remove Viable and Non-Viable tissue/material. Material removed includes  Eschar, Subcutaneous Tissue, Slough, Skin: Dermis, and Skin: Epidermis after achieving pain control using Lidocaine 4% Topical Solution. [name] time out was conducted at 11:15, prior to the start of the procedure. [name] Minimum amount of bleeding was controlled with Pressure. The procedure was tolerated well with [name] pain level of 0 throughout and [name] pain level of 0 following the procedure. Post Debridement Measurements: 2.1cm length x 0.2cm width x 0.1cm depth; 0.033cm^3 volume. Character of Wound/Ulcer Post Debridement is improved. Severity of Tissue Post Debridement is: Fat layer exposed. Post procedure Diagnosis Wound #1: Same as Pre-Procedure Plan Follow-up Appointments: Return Appointment in 1 week. - w/ Dr. Mikey Bussing 11/24/2022 1015 Return Appointment in 2 weeks. - Dr. Mikey Bussing 12/01/2022 2pm Other: - keep Pulmonologist appt at end of February. Call and follow up with Dr. Ralene Cork about bone biopsy and infectious disease appt. pick up the second round of antibiotics. Anesthetic: (In clinic) Topical Lidocaine 5% applied to wound bed Cellular or Tissue Based Products: Cellular or Tissue Based Product Type: - Grafix approved-waiting to hear about copay or coinsurance-cost for you out of your pocket. Kerecsis denied by insurance. Grafix #1 applied on 09/24/22 Grafix #2 applied on 09/29/2022 Grafix # 3 applied on 10/07/22 Grafix # 4 applied on 10/19/22 Grafix # 5 applied 10/26/2022 Grafix applied to right achilles wound 11/03/2022 Grafix pending insurance approval Cellular or Tissue Based Product applied to wound bed, secured with steri-strips, cover with Adaptic or Mepitel. (DO NOT REMOVE). Edema Control - Lymphedema / SCD / Other: Avoid standing for long periods of time. Off-Loading: Open toe surgical shoe to: - ****put PegAssist in surgical shoe.**** Prevalon Boot - left foot while resting in bed at night. Other: - minimize walking and standing to aid in offloading pressure to wound. Hyperbaric Oxygen  Therapy: Wound #2 Left,Plantar Foot: Evaluate for HBO Therapy Indication: - Wagner grade 3 If appropriate for treatment, begin HBOT per protocol: 2.0 ATA for 90 Minutes with 2 Five (5) Minute Air Breaks T Number of Treatments: - 40 otal One treatments per day (delivered Monday through Friday unless otherwise specified in Special Instructions below): Finger stick Blood Glucose Pre- and Post- HBOT Treatment. Follow Hyperbaric Oxygen Glycemia Protocol Afrin (Oxymetazoline HCL)  0.05% nasal spray - 1 spray in both nostrils daily as needed prior to HBO treatment for difficulty clearing ears The following medication(s) was prescribed: levofloxacin oral 750 mg tablet 1 1 tablet oral every twelve hours x 7 days starting 11/17/2022 WOUND #1: - Achilles Wound Laterality: Right Cleanser: Soap and Water 1 x Per Week/30 Days Discharge Instructions: May shower and wash wound with dial antibacterial soap and water prior to dressing change. Cleanser: Wound Cleanser 1 x Per Week/30 Days Discharge Instructions: Cleanse the wound with wound cleanser prior to applying [name] clean dressing using gauze sponges, not tissue or cotton balls. Peri-Wound Care: Sween Lotion (Moisturizing lotion) 1 x Per Week/30 Days Discharge Instructions: Apply moisturizing lotion as directed Prim Dressing: Promogran Prisma Matrix, 4.34 (sq in) (silver collagen) 1 x Per Week/30 Days ary Discharge Instructions: Moisten collagen with saline or hydrogel Secondary Dressing: ABD Pad, 5x9 1 x Per Week/30 Days Discharge Instructions: Apply over primary dressing as directed. Secondary Dressing: Woven Gauze Sponge, Non-Sterile 4x4 in 1 x Per Week/30 Days Discharge Instructions: Apply over primary dressing as directed. Com pression Wrap: Kerlix Roll 4.5x3.1 (in/yd) 1 x Per Week/30 Days Discharge Instructions: Apply Kerlix and Coban compression as directed. Com pression Wrap: Coban Self-Adherent Wrap 4x5 (in/yd) 1 x Per Week/30 Days Discharge  Instructions: Apply over Kerlix as directed. WOUND #2: - Foot Wound Laterality: Plantar, Left Cleanser: Wound Cleanser 1 x Per Day/30 Days FREEDOM, PEDDY [name] ([id number]) [id number] Page 10 of 12 Discharge Instructions: Cleanse the wound with wound cleanser prior to applying [name] clean dressing using gauze sponges, not tissue or cotton balls. Peri-Wound Care: Skin Prep (DME) (Generic) 1 x Per Day/30 Days Discharge Instructions: Use skin prep as directed Prim Dressing: Dakin's Solution 0.25%, 16 (oz) 1 x Per Day/30 Days ary Discharge Instructions: Moisten gauze with Dakin's solution Secondary Dressing: Optifoam Non-Adhesive Dressing, 4x4 in (DME) (Generic) 1 x Per Day/30 Days Discharge Instructions: Apply over primary dressing as directed. Secondary Dressing: Woven Gauze Sponge, Non-Sterile 4x4 in (DME) (Generic) 1 x Per Day/30 Days Discharge Instructions: Apply over primary dressing as directed. Secondary Dressing: Woven Gauze Sponges 2x2 in (DME) (Generic) 1 x Per Day/30 Days Discharge Instructions: Apply over primary dressing as directed. Secured With: Insurance underwriter, Sterile 2x75 (in/in) (DME) (Generic) 1 x Per Day/30 Days Discharge Instructions: Secure with stretch gauze as directed. Secured With: 36M Medipore H Soft Cloth Surgical T ape, 4 x 10 (in/yd) (DME) (Generic) 1 x Per Day/30 Days Discharge Instructions: Secure with tape as directed. 1. In office sharp debridement 2. Collagen under Kerlix/Cobanooright lower extremity 3. Dakin's wet-to-dry dressings to the lateral left foot wound 4. Continue ciprofloxacin 5. Follow-up in 1 week 6. Aggressive offloadingoosurgical shoe with peg assist Electronic Signature(s) Signed: 11/17/2022 11:48:32 AM By: Darren Corwin DO Entered By: Darren Corwin on 11/17/2022 11:48:00 -------------------------------------------------------------------------------- HxROS Details Patient Name: Date of  Service: [name]. 11/17/2022 10:15 [name] [name] Medical Record Number: [id number] Patient Account Number: [id number] Date of Birth/Sex: Treating RN: 08/29/1951 (72 y.o. [name]) Primary Care Provider: Richarda Blade Other Clinician: Referring Provider: Treating Provider/Extender: Darren Corwin Ngetich, [name] Weeks in Treatment: 17 Information Obtained From Chart Respiratory Medical History: Positive for: Sleep Apnea - does not wear Cardiovascular Medical History: Positive for: Arrhythmia - [name]. Fib; Congestive Heart Failure - EF 25%; Hypertension; Peripheral Arterial Disease Negative for: Peripheral Venous Disease Gastrointestinal Medical History: Past Medical History Notes: Ascites Endocrine Medical History: Positive for: Type II Diabetes Time with diabetes: 6 years  Treated with: Insulin Blood sugar tested every day: No Genitourinary Medical History: Past Medical History Notes: stage III Kidney disease MIKIAH, DEMOND [name] ([id number]) [id number] Page 11 of 12 Immunizations Pneumococcal Vaccine: Received Pneumococcal Vaccination: No Implantable Devices No devices added Hospitalization / Surgery History Type of Hospitalization/Surgery Angiogram 06/11/2022 Dr. 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 11/17/2022 Medical Record Number:  [id number] Patient Account Number: [id number] Date of Birth/Sex: Treating RN: 12-Feb-1951 (72 y.o. 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 M86.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, left ankle and foot Facility Procedures : CPT4 Code: [id number] Description: 11042 - DEB SUBQ TISSUE 20 SQ CM/< ICD-10 Diagnosis Description 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 mu Modifier: scle Quantity: 1 Physician Procedures : CPT4 Code Description Modifier [id number] - WC PHYS LEVEL 3 - EST PT 25 ICD-10 Diagnosis Description L97.522 Non-pressure chronic ulcer of other part of left foot with fat layer exposed E11.621 Type 2 diabetes mellitus with foot ulcer M86.672 Other  chronic osteomyelitis, left ankle and foot Quantity: 1 : [id number] 11042 - WC PHYS SUBQ TISS 20 SQ CM ICD-10 Diagnosis Description 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

## 2022-11-17 NOTE — Progress Notes (Signed)
[name], KOCAK A ([id number]) [id number] Page 1 of 10 Visit Report for 11/17/2022 Arrival Information Details Patient Name: Date of Service: [name] [facility] A. 11/17/2022 10:15 A M Medical Record Number: [id number] Patient Account Number: [id number] Date of Birth/Sex: Treating RN: 1951-04-10 (72 y.o. M) Primary Care Arlayne Liggins: Richarda Blade Other Clinician: Referring Marckus Hanover: Treating Lanaiya Lantry/Extender: Geralyn Corwin Ngetich, [name] in Treatment: 17 Visit Information History Since Last Visit All ordered tests and consults were completed: No Patient Arrived: Crutches Added or deleted any medications: No Arrival Time: 10:47 Any new allergies or adverse reactions: No Accompanied By: daughter Had a fall or experienced change in No Transfer Assistance: None activities of daily living that may affect Patient Identification Verified: Yes risk of falls: Secondary Verification Process Completed: Yes Signs or symptoms of abuse/neglect since last visito No Patient Requires Transmission-Based Precautions: No Hospitalized since last visit: No Patient Has Alerts: Yes Implantable device outside of the clinic excluding No Patient Alerts: Patient on Blood Thinner cellular tissue based products placed in the center since last visit: Pain Present Now: No Electronic Signature(s) Signed: 11/17/2022 11:36:33 AM By: Dayton Scrape Entered By: Dayton Scrape on 11/17/2022 10:47:25 -------------------------------------------------------------------------------- Encounter Discharge Information Details Patient Name: Date of Service: [name] [name] [name] A. 11/17/2022 10:15 A M Medical Record Number: [id number] Patient Account Number: [id number] Date of Birth/Sex: Treating RN: [birth year] (72 y.o. [name] Primary Care Viann Nielson: Richarda Blade Other Clinician: Referring Deone Leifheit: Treating Ashland Osmer/Extender: Geralyn Corwin Ngetich, [name] in Treatment:  17 Encounter Discharge Information Items Post Procedure Vitals Discharge Condition: Stable Temperature (F): 97.7 Ambulatory Status: Crutches Pulse (bpm): 123 Discharge Destination: Home Respiratory Rate (breaths/min): 20 Transportation: Private Auto Blood Pressure (mmHg): 129/82 Accompanied By: family Schedule Follow-up Appointment: Yes Clinical Summary of Care: Electronic Signature(s) Signed: 11/17/2022 1:40:02 PM By: Shawn Stall RN, BSN Entered By: Shawn Stall on 11/17/2022 11:26:13 [name] ([id number]) [id number] Page 2 of 10 -------------------------------------------------------------------------------- Lower Extremity Assessment Details Patient Name: Date of Service: [name] [facility] A. 11/17/2022 10:15 A M Medical Record Number: [id number] Patient Account Number: [id number] Date of Birth/Sex: Treating RN: [birth year] (72 y.o. [name] Primary Care Torre Pikus: Richarda Blade Other Clinician: Referring Onia Shiflett: Treating Zeph Riebel/Extender: Geralyn Corwin Ngetich, [name] in Treatment: 17 Edema Assessment Assessed: [Left: No] [Right: No] Edema: [Left: No] [Right: No] Calf Left: Right: Point of Measurement: 39 cm From Medial Instep 35 cm Ankle Left: Right: Point of Measurement: 8 cm From Medial Instep 23 cm Electronic Signature(s) Signed: 11/17/2022 1:40:02 PM By: Shawn Stall RN, BSN Entered By: Shawn Stall on 11/17/2022 11:16:52 -------------------------------------------------------------------------------- Multi Wound Chart Details Patient Name: Date of Service: [name] A. 11/17/2022 10:15 A M Medical Record Number: [id number] Patient Account Number: [id number] Date of Birth/Sex: Treating RN: 1951/03/02 (72 y.o. 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 Primary Etiology: Extremity Extremity Sleep Apnea, Arrhythmia, Congestive Sleep Apnea, Arrhythmia, Congestive N/A Comorbid History: Heart Failure, Hypertension, Peripheral Heart Failure, Hypertension, Peripheral [name], [name] A ([id number]) [phone].pdf Page 3 of 10 Arterial Disease, Type II Diabetes Arterial Disease, Type II Diabetes 07/16/2022 03/09/2022 N/A Date Acquired: 17 17 N/A  Weeks of Treatment: Open Open N/A Wound Status: No No N/A Wound Recurrence: 2.1x0.2x0.1 0.9x0.5x0.2 N/A Measurements L x W x D (cm) 0.33 0.353 N/A A (cm) : rea 0.033 0.071 N/A Volume (cm) : 98.40% -60.50% N/A % Reduction in A rea: 99.70% -7.60% N/A % Reduction in Volume: Grade 2 Grade 3 N/A Classification: Medium Medium N/A Exudate A mount: Serosanguineous Serosanguineous N/A Exudate Type: red, brown red, brown N/A Exudate Color: Distinct, outline attached Distinct, outline attached N/A Wound Margin: Large (67-100%) Medium (34-66%) N/A Granulation A mount: Red Pink N/A Granulation Quality: Small (1-33%) Medium (34-66%) N/A Necrotic A mount: Fat Layer (Subcutaneous Tissue): Yes Fat Layer (Subcutaneous Tissue): Yes N/A Exposed Structures: Fascia: No Bone: Yes Tendon: No Fascia: No Muscle: No Tendon: No Joint: No Muscle: No Bone: No Joint: No Large (67-100%) Small (1-33%) N/A Epithelialization: Debridement - Excisional N/A N/A Debridement: Pre-procedure Verification/Time Out 11:15 N/A N/A Taken: Lidocaine 4% Topical Solution N/A N/A Pain Control: Necrotic/Eschar, Subcutaneous, N/A N/A Tissue  Debrided: Slough Skin/Subcutaneous Tissue N/A N/A Level: 2.1 N/A N/A Debridement A (sq cm): rea Curette N/A N/A Instrument: Minimum N/A N/A Bleeding: Pressure N/A N/A Hemostasis Achieved: 0 N/A N/A Procedural Pain: 0 N/A N/A Post Procedural Pain: Debridement Treatment Response: Procedure was tolerated well N/A N/A Post Debridement Measurements L x 2.1x0.2x0.1 N/A N/A W x D (cm) 0.033 N/A N/A Post Debridement Volume: (cm) Excoriation: No Callus: Yes N/A Periwound Skin Texture: Induration: No Excoriation: No Callus: No Induration: No Crepitus: No Crepitus: No Rash: No Rash: No Scarring: No Scarring: No Dry/Scaly: Yes Maceration: No N/A Periwound Skin Moisture: Maceration: No Dry/Scaly: No Atrophie Blanche: No Atrophie Blanche: No N/A Periwound Skin Color: Cyanosis: No Cyanosis: No Ecchymosis: No Ecchymosis: No Erythema: No Erythema: No Hemosiderin Staining: No Hemosiderin Staining: No Mottled: No Mottled: No Pallor: No Pallor: No Rubor: No Rubor: No Debridement N/A N/A Procedures Performed: Treatment Notes Wound #1 (Achilles) Wound Laterality: Right Cleanser Soap and Water Discharge Instruction: May shower and wash wound with dial antibacterial soap and water prior to dressing change. Wound Cleanser Discharge Instruction: Cleanse the wound with wound cleanser prior to applying a clean dressing using gauze sponges, not tissue or cotton balls. Peri-Wound Care Sween Lotion (Moisturizing lotion) Discharge Instruction: Apply moisturizing lotion as directed Topical Primary Dressing Promogran Prisma Matrix, 4.34 (sq in) (silver collagen) Discharge Instruction: Moisten collagen with saline or hydrogel Secondary Dressing ABD Pad, 5x9 Discharge Instruction: Apply over primary dressing as directed. IFEOLUWA, BELLER A ([id number]) [id number] Page 4 of 10 Woven Gauze Sponge, Non-Sterile 4x4 in Discharge Instruction: Apply over  primary dressing as directed. Secured With Compression Wrap Kerlix Roll 4.5x3.1 (in/yd) Discharge Instruction: Apply Kerlix and Coban compression as directed. Coban Self-Adherent Wrap 4x5 (in/yd) Discharge Instruction: Apply over Kerlix as directed. Compression Stockings Add-Ons Wound #2 (Foot) Wound Laterality: Plantar, Left Cleanser Wound Cleanser Discharge Instruction: Cleanse the wound with wound cleanser prior to applying a clean dressing using gauze sponges, not tissue or cotton balls. Peri-Wound Care Skin Prep Discharge Instruction: Use skin prep as directed Topical Primary Dressing Dakin's Solution 0.25%, 16 (oz) Discharge Instruction: Moisten gauze with Dakin's solution Secondary Dressing Optifoam Non-Adhesive Dressing, 4x4 in Discharge Instruction: Apply over primary dressing as directed. Woven Gauze Sponge, Non-Sterile 4x4 in Discharge Instruction: Apply over primary dressing as directed. Woven Gauze Sponges 2x2 in Discharge Instruction: Apply over primary dressing as directed. Secured With Conforming Stretch Gauze Bandage, Sterile 2x75 (in/in) Discharge Instruction: Secure with stretch gauze as directed. 69M Medipore H Soft Cloth Surgical  T ape, 4 x 10 (in/yd) Discharge Instruction: Secure with tape as directed. Compression Wrap Compression Stockings Add-Ons Electronic Signature(s) Signed: 11/17/2022 11:48:32 AM By: [name] DO Entered By: [name] on 11/17/2022 11:29:17 -------------------------------------------------------------------------------- Multi-Disciplinary Care Plan Details Patient Name: Date of Service: [name] [name] A. 11/17/2022 10:15 A M Medical Record Number: [id number] Patient Account Number: [id number] Date of Birth/Sex: Treating RN: [birth year] (72 y.o. [name] Primary Care Kyian Obst: [name] Other Clinician: Referring Kaedan Richert: Treating Janellie Tennison/Extender: [name], [name] in  Treatment: [address] [place] A ([id number]) [id number] Page 5 of 10 Active Inactive Nutrition Nursing Diagnoses: Potential for alteratiion in Nutrition/Potential for imbalanced nutrition Goals: Patient/caregiver agrees to and verbalizes understanding of need to use nutritional supplements and/or vitamins as prescribed Date Initiated: 07/16/2022 Target Resolution Date: 01/08/2023 Goal Status: Active Patient/caregiver will maintain therapeutic glucose control Date Initiated: 07/16/2022 Target Resolution Date: 01/08/2023 Goal Status: Active Interventions: Assess HgA1c results as ordered upon admission and as needed Provide education on nutrition Treatment Activities: Education provided on Nutrition : 10/08/2022 Obtain HgA1c : 07/16/2022 Patient referred to Primary Care Physician for further nutritional evaluation : 07/16/2022 Notes: Pain, Acute or Chronic Nursing Diagnoses: Pain, acute or chronic: actual or potential Potential alteration in comfort, pain Goals: Patient will verbalize adequate pain control and receive pain control interventions during procedures as needed Date Initiated: 07/16/2022 Target Resolution Date: 01/08/2023 Goal Status: Active Interventions: Encourage patient to take pain medications as prescribed Provide education on pain management Reposition patient for comfort Treatment Activities: Administer pain control measures as ordered : 07/16/2022 Notes: Electronic Signature(s) Signed: 11/17/2022 1:40:02 PM By: Deon Pilling RN, BSN Entered By: Deon Pilling on 11/17/2022 11:17:57 -------------------------------------------------------------------------------- Pain Assessment Details Patient Name: Date of Service: [name] [name] A. 11/17/2022 10:15 A M Medical Record Number: [id number] Patient Account Number: [id number] Date of Birth/Sex: Treating RN: May 31, 1951 (72 y.o. M) Primary Care Anniebelle Devore: [name] Other Clinician: Referring  Meggin Ola: Treating Gotti Alwin/Extender: [name] Ngetich, [name] in Treatment: 17 Active Problems Location of Pain Severity and Description of Pain Patient Has Paino No Site Locations ADRIAAN, MALTESE A ([id number]) [id number] Page 6 of 10 Pain Management and Medication Current Pain Management: Electronic Signature(s) Signed: 11/17/2022 11:36:33 AM By: Worthy Rancher Entered By: Worthy Rancher on 11/17/2022 10:48:19 -------------------------------------------------------------------------------- Patient/Caregiver Education Details Patient Name: Date of Service: Neal Dy A. 1/9/2024andnbsp10:15 A M Medical Record Number: [id number] Patient Account Number: [id number] Date of Birth/Gender: Treating RN: 27-May-1951 (72 y.o. [name] Primary Care Physician: [name] Other Clinician: Referring Physician: Treating Physician/Extender: [name] in Treatment: 17 Education Assessment Education Provided To: Patient Education Topics Provided Wound/Skin Impairment: Handouts: Caring for Your Ulcer Methods: Explain/Verbal Responses: Reinforcements needed Electronic Signature(s) Signed: 11/17/2022 1:40:02 PM By: Deon Pilling RN, BSN Entered By: Deon Pilling on 11/17/2022 11:18:31 -------------------------------------------------------------------------------- Wound Assessment Details Patient Name: Date of Service: [name] [name] A. 11/17/2022 10:15 A M Medical Record Number: [id number] Patient Account Number: [id number] Date of Birth/Sex: Treating RN: 1951/06/21 (72 y.o. M) Primary Care Yedidya Duddy: [name] Other Clinician: JEWETT, MCGANN ([id number]) [id number] Page 7 of 10 Referring Kierstan Auer: Treating Kedar Sedano/Extender: [name] Ngetich, [name] in Treatment: 17 Wound Status Wound Number: 1 Primary Diabetic Wound/Ulcer of the Lower  Extremity Etiology: Wound Location: Right Achilles Wound Open Wounding Event: Surgical Injury Status: Date Acquired: 07/16/2022 Comorbid Sleep Apnea, Arrhythmia, Congestive Heart Failure, Hypertension, Weeks Of Treatment: 17 History: Peripheral Arterial Disease, Type II Diabetes  Clustered Wound: No Photos Wound Measurements Length: (cm) 2.1 Width: (cm) 0.2 Depth: (cm) 0.1 Area: (cm) 0.33 Volume: (cm) 0.033 % Reduction in Area: 98.4% % Reduction in Volume: 99.7% Epithelialization: Large (67-100%) Tunneling: No Undermining: No Wound Description Classification: Grade 2 Wound Margin: Distinct, outline attached Exudate Amount: Medium Exudate Type: Serosanguineous Exudate Color: red, brown Foul Odor After Cleansing: No Slough/Fibrino Yes Wound Bed Granulation Amount: Large (67-100%) Exposed Structure Granulation Quality: Red Fascia Exposed: No Necrotic Amount: Small (1-33%) Fat Layer (Subcutaneous Tissue) Exposed: Yes Necrotic Quality: Adherent Slough Tendon Exposed: No Muscle Exposed: No Joint Exposed: No Bone Exposed: No Periwound Skin Texture Texture Color No Abnormalities Noted: No No Abnormalities Noted: No Callus: No Atrophie Blanche: No Crepitus: No Cyanosis: No Excoriation: No Ecchymosis: No Induration: No Erythema: No Rash: No Hemosiderin Staining: No Scarring: No Mottled: No Pallor: No Moisture Rubor: No No Abnormalities Noted: No Dry / Scaly: Yes Maceration: No Treatment Notes Wound #1 (Achilles) Wound Laterality: Right Cleanser Soap and Water Discharge Instruction: May shower and wash wound with dial antibacterial soap and water prior to dressing change. Wound Cleanser Discharge Instruction: Cleanse the wound with wound cleanser prior to applying a clean dressing using gauze sponges, not tissue or cotton balls. Peri-Wound Care RASHARD, RYLE A ([id number]) [id number] Page 8 of 10 Sween Lotion (Moisturizing  lotion) Discharge Instruction: Apply moisturizing lotion as directed Topical Primary Dressing Promogran Prisma Matrix, 4.34 (sq in) (silver collagen) Discharge Instruction: Moisten collagen with saline or hydrogel Secondary Dressing ABD Pad, 5x9 Discharge Instruction: Apply over primary dressing as directed. Woven Gauze Sponge, Non-Sterile 4x4 in Discharge Instruction: Apply over primary dressing as directed. Secured With Compression Wrap Kerlix Roll 4.5x3.1 (in/yd) Discharge Instruction: Apply Kerlix and Coban compression as directed. Coban Self-Adherent Wrap 4x5 (in/yd) Discharge Instruction: Apply over Kerlix as directed. Compression Stockings Add-Ons Electronic Signature(s) Signed: 11/17/2022 1:40:02 PM By: Shawn Stall RN, BSN Entered By: Shawn Stall on 11/17/2022 11:17:05 -------------------------------------------------------------------------------- Wound Assessment Details Patient Name: Date of Service: Darren Crook [facility] A. 11/17/2022 10:15 A M Medical Record Number: [id number] Patient Account Number: [id number] Date of Birth/Sex: Treating RN: 1951/02/16 (72 y.o. M) Primary Care Latifa Noble: Richarda Blade Other Clinician: Referring Donni Oglesby: Treating Donnita Farina/Extender: Geralyn Corwin Ngetich, [name] in Treatment: 17 Wound Status Wound Number: 2 Primary Diabetic Wound/Ulcer of the Lower Extremity Etiology: Wound Location: Left, Plantar Foot Wound Open Wounding Event: Gradually Appeared Status: Date Acquired: 03/09/2022 Comorbid Sleep Apnea, Arrhythmia, Congestive Heart Failure, Hypertension, Weeks Of Treatment: 17 History: Peripheral Arterial Disease, Type II Diabetes Clustered Wound: No Photos Wound Measurements Length: (cm) 0.9 Width: (cm) 0.5 Depth: (cm) 0.2 Alcocer, Krikor A ([id number]) Area: (cm) 0.353 Volume: (cm) 0.071 % Reduction in Area: -60.5% % Reduction in Volume: -7.6% Epithelialization: Small  (1-33%) [id number] Page 9 of 10 Tunneling: No Undermining: No Wound Description Classification: Grade 3 Wound Margin: Distinct, outline attached Exudate Amount: Medium Exudate Type: Serosanguineous Exudate Color: red, brown Foul Odor After Cleansing: No Slough/Fibrino No Wound Bed Granulation Amount: Medium (34-66%) Exposed Structure Granulation Quality: Pink Fascia Exposed: No Necrotic Amount: Medium (34-66%) Fat Layer (Subcutaneous Tissue) Exposed: Yes Necrotic Quality: Adherent Slough Tendon Exposed: No Muscle Exposed: No Joint Exposed: No Bone Exposed: Yes Periwound Skin Texture Texture Color No Abnormalities Noted: No No Abnormalities Noted: No Callus: Yes Atrophie Blanche: No Crepitus: No Cyanosis: No Excoriation: No Ecchymosis: No Induration: No Erythema: No Rash: No Hemosiderin Staining: No Scarring: No Mottled: No Pallor: No Moisture Rubor: No No Abnormalities Noted: No Dry / Scaly: No  Maceration: No Treatment Notes Wound #2 (Foot) Wound Laterality: Plantar, Left Cleanser Wound Cleanser Discharge Instruction: Cleanse the wound with wound cleanser prior to applying a clean dressing using gauze sponges, not tissue or cotton balls. Peri-Wound Care Skin Prep Discharge Instruction: Use skin prep as directed Topical Primary Dressing Dakin's Solution 0.25%, 16 (oz) Discharge Instruction: Moisten gauze with Dakin's solution Secondary Dressing Optifoam Non-Adhesive Dressing, 4x4 in Discharge Instruction: Apply over primary dressing as directed. Woven Gauze Sponge, Non-Sterile 4x4 in Discharge Instruction: Apply over primary dressing as directed. Woven Gauze Sponges 2x2 in Discharge Instruction: Apply over primary dressing as directed. Secured With Conforming Stretch Gauze Bandage, Sterile 2x75 (in/in) Discharge Instruction: Secure with stretch gauze as directed. 11M Medipore H Soft Cloth Surgical T ape, 4 x 10  (in/yd) Discharge Instruction: Secure with tape as directed. Compression Wrap Compression Stockings Add-Ons Electronic Signature(s) Signed: 11/17/2022 1:40:02 PM By: Shawn Stall RN, BSN [name] ([id number]) [id number] Page 10 of 10 Entered By: Shawn Stall on 11/17/2022 11:17:17 -------------------------------------------------------------------------------- Vitals Details Patient Name: Date of Service: [name] [facility] A. 11/17/2022 10:15 A M Medical Record Number: [id number] Patient Account Number: [id number] Date of Birth/Sex: Treating RN: 12/16/1950 (72 y.o. M) Primary Care Aurie Harroun: Richarda Blade Other Clinician: Referring Milee Qualls: Treating Leeana Creer/Extender: Geralyn Corwin Ngetich, [name] in Treatment: 17 Vital Signs Time Taken: 10:47 Temperature (F): 97.7 Height (in): 68 Pulse (bpm): 123 Weight (lbs): 185 Respiratory Rate (breaths/min): 20 Body Mass Index (BMI): 28.1 Blood Pressure (mmHg): 129/82 Reference Range: 80 - 120 mg / dl Electronic Signature(s) Signed: 11/17/2022 11:36:33 AM By: Dayton Scrape Entered By: Dayton Scrape on 11/17/2022 10:47:53

## 2022-11-18 ENCOUNTER — Ambulatory Visit: Payer: Medicare HMO

## 2022-11-20 ENCOUNTER — Ambulatory Visit: Payer: Medicare HMO | Attending: Cardiology

## 2022-11-20 DIAGNOSIS — Z5181 Encounter for therapeutic drug level monitoring: Secondary | ICD-10-CM | POA: Diagnosis not present

## 2022-11-20 DIAGNOSIS — I513 Intracardiac thrombosis, not elsewhere classified: Secondary | ICD-10-CM | POA: Diagnosis not present

## 2022-11-20 DIAGNOSIS — I4891 Unspecified atrial fibrillation: Secondary | ICD-10-CM | POA: Diagnosis not present

## 2022-11-20 LAB — POCT INR: INR: 2.9 (ref 2.0–3.0)

## 2022-11-20 NOTE — Patient Instructions (Signed)
Continue 1/2 tablet daily except for 1 tablet on Mondays, Wednesdays, and Fridays.  Recheck INR 4 weeks.  [facility] [phone].

## 2022-11-21 ENCOUNTER — Other Ambulatory Visit: Payer: Self-pay | Admitting: Family

## 2022-11-24 ENCOUNTER — Encounter (HOSPITAL_BASED_OUTPATIENT_CLINIC_OR_DEPARTMENT_OTHER): Payer: Medicare HMO | Admitting: Internal Medicine

## 2022-11-24 ENCOUNTER — Ambulatory Visit (INDEPENDENT_AMBULATORY_CARE_PROVIDER_SITE_OTHER)
Admission: RE | Admit: 2022-11-24 | Discharge: 2022-11-24 | Disposition: A | Discharge status: Home / Self Care | Payer: Medicare HMO | Source: Ambulatory Visit | Attending: Vascular Surgery | Admitting: Vascular Surgery

## 2022-11-24 ENCOUNTER — Ambulatory Visit (INDEPENDENT_AMBULATORY_CARE_PROVIDER_SITE_OTHER): Payer: Medicare HMO | Admitting: Vascular Surgery

## 2022-11-24 ENCOUNTER — Ambulatory Visit (HOSPITAL_COMMUNITY)
Admission: RE | Admit: 2022-11-24 | Discharge: 2022-11-24 | Disposition: A | Discharge status: Home / Self Care | Payer: Medicare HMO | Source: Ambulatory Visit | Attending: Vascular Surgery | Admitting: Vascular Surgery

## 2022-11-24 ENCOUNTER — Encounter: Payer: Self-pay | Admitting: Vascular Surgery

## 2022-11-24 VITALS — BP 140/82 | HR 126 | Temp 97.9°F | Resp 16 | Ht 68.0 in | Wt 206.0 lb

## 2022-11-24 DIAGNOSIS — L97518 Non-pressure chronic ulcer of other part of right foot with other specified severity: Secondary | ICD-10-CM

## 2022-11-24 DIAGNOSIS — L97522 Non-pressure chronic ulcer of other part of left foot with fat layer exposed: Secondary | ICD-10-CM | POA: Diagnosis not present

## 2022-11-24 DIAGNOSIS — L97813 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: Secondary | ICD-10-CM | POA: Diagnosis not present

## 2022-11-24 DIAGNOSIS — I739 Peripheral vascular disease, unspecified: Secondary | ICD-10-CM

## 2022-11-24 DIAGNOSIS — E1151 Type 2 diabetes mellitus with diabetic peripheral angiopathy without gangrene: Secondary | ICD-10-CM | POA: Diagnosis not present

## 2022-11-24 DIAGNOSIS — M86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, left ankle and foot: Secondary | ICD-10-CM | POA: Diagnosis not present

## 2022-11-24 DIAGNOSIS — L97512 Non-pressure chronic ulcer of other part of right foot with fat layer exposed: Secondary | ICD-10-CM | POA: Diagnosis not present

## 2022-11-24 DIAGNOSIS — I70223 Atherosclerosis of native arteries of extremities with rest pain, bilateral legs: Secondary | ICD-10-CM | POA: Insufficient documentation

## 2022-11-24 DIAGNOSIS — E1140 Type 2 diabetes mellitus with diabetic neuropathy, unspecified: Secondary | ICD-10-CM | POA: Diagnosis not present

## 2022-11-24 DIAGNOSIS — E11621 Type 2 diabetes mellitus with foot ulcer: Secondary | ICD-10-CM | POA: Diagnosis not present

## 2022-11-24 DIAGNOSIS — E11622 Type 2 diabetes mellitus with other skin ulcer: Secondary | ICD-10-CM | POA: Diagnosis not present

## 2022-11-24 DIAGNOSIS — E1169 Type 2 diabetes mellitus with other specified complication: Secondary | ICD-10-CM | POA: Diagnosis not present

## 2022-11-24 LAB — VAS US ABI WITH/WO TBI
Left ABI: 1.09
Right ABI: 1.19

## 2022-11-24 NOTE — Progress Notes (Signed)
KASPIAN, MUCCIO A ([id number]) [id number] Page 1 of 10 Visit Report for 11/24/2022 Arrival Information Details Patient Name: Date of Service: [name] [facility] A. 11/24/2022 10:15 A M Medical Record Number: [id number] Patient Account Number: [id number] Date of Birth/Sex: Treating RN: Apr 18, 1951 (72 y.o. [name] Primary Care Quinnlan Abruzzo: [name] Other Clinician: Referring Harlynn Kimbell: Treating Tiburcio Linder/Extender: [name] Ngetich, [name] in Treatment: 18 Visit Information History Since Last Visit Added or deleted any medications: No Patient Arrived: Crutches Any new allergies or adverse reactions: No Arrival Time: 10:49 Had a fall or experienced change in No Accompanied By: family activities of daily living that may affect Transfer Assistance: Manual risk of falls: Patient Identification Verified: Yes Signs or symptoms of abuse/neglect since last visito No Secondary Verification Process Completed: Yes Hospitalized since last visit: No Patient Requires Transmission-Based Precautions: No Implantable device outside of the clinic excluding No Patient Has Alerts: Yes cellular tissue based products placed in the center Patient Alerts: Patient on Blood Thinner since last visit: Has Dressing in Place as Prescribed: Yes Has Compression in Place as Prescribed: Yes Pain Present Now: No Notes patient late to appt. Explained the late policy and to please ensure to be on time for next appt. Electronic Signature(s) Signed: 11/24/2022 4:14:29 PM By: Deon Pilling RN, BSN Entered By: Deon Pilling on 11/24/2022 10:50:04 -------------------------------------------------------------------------------- Encounter Discharge Information Details Patient Name: Date of Service: [name] [name] [facility] A. 11/24/2022 10:15 A M Medical Record Number: [id number] Patient Account Number: [id number] Date of Birth/Sex: Treating RN: November 24, 1950 (72 y.o. [name] Primary Care Finley Dinkel: [name] Other Clinician: Referring Saamir Armstrong: Treating Keniya Schlotterbeck/Extender: [name] Ngetich, [name] in Treatment: 52 Encounter Discharge Information Items Post Procedure Vitals Discharge Condition: Stable Temperature (F): 98 Ambulatory Status: Crutches Pulse (bpm): 103 Discharge Destination: Home Respiratory Rate (breaths/min): 20 Transportation: Private Auto Blood Pressure (mmHg): 137/69 Accompanied By: family Schedule Follow-up Appointment: Yes Clinical Summary of Care: Electronic Signature(s) Signed: 11/24/2022 4:14:29 PM By: Deon Pilling RN, BSN Entered By: Deon Pilling on 11/24/2022 11:31:55 [name] ([id number]) [id number] Page 2 of 10 -------------------------------------------------------------------------------- Lower Extremity Assessment Details Patient Name: Date of Service: [name] [facility] A. 11/24/2022 10:15 A M Medical Record Number: [id number] Patient Account Number: [id number] Date of Birth/Sex: Treating RN: 11-11-1950 (72 y.o. [name] Primary Care Payge Eppes: [name] Other Clinician: Referring Ardythe Klute: Treating Abundio Teuscher/Extender: [name] Ngetich, [name] in Treatment: 18 Edema Assessment Assessed: [Left: Yes] [Right: Yes] Edema: [Left: No] [Right: No] Calf Left: Right: Point of Measurement: 39 cm From Medial Instep 37 cm 35 cm Ankle Left: Right: Point of Measurement: 8 cm From Medial Instep 25 cm 23 cm Vascular Assessment Pulses: Dorsalis Pedis Palpable: [Left:Yes] [Right:Yes] Electronic Signature(s) Signed: 11/24/2022 4:14:29 PM By: Deon Pilling RN, BSN Entered By: Deon Pilling on 11/24/2022 10:54:54 -------------------------------------------------------------------------------- Multi Wound Chart Details Patient Name: Date of Service: [name] 11/24/2022 10:15 A M Medical Record Number: [id number] Patient Account  Number: [id number] Date of Birth/Sex: Treating RN: 1951/05/12 (72 y.o. M) Primary Care Shaunn Tackitt: [name] Other Clinician: Referring Chihiro Frey: Treating Mena Simonis/Extender: [name] Ngetich, [name] in Treatment: 18 Vital Signs Height(in): 68 Pulse(bpm): 103 Weight(lbs): 185 Blood Pressure(mmHg): 137/69 Body Mass Index(BMI): 28.1 Temperature(F): 98 Respiratory Rate(breaths/min): 20 [1:Photos:] [N/A:N/A [id number] Page 3 of 10] Right Achilles Left, Plantar Foot N/A Wound Location: Surgical Injury Gradually Appeared N/A Wounding Event: Diabetic Wound/Ulcer of the Lower Diabetic Wound/Ulcer of the Lower N/A Primary Etiology: Extremity  Extremity Sleep Apnea, Arrhythmia, Congestive Sleep Apnea, Arrhythmia, Congestive N/A Comorbid History: Heart Failure, Hypertension, Peripheral Heart Failure, Hypertension, Peripheral Arterial Disease, Type II Diabetes Arterial Disease, Type II Diabetes 07/16/2022 03/09/2022 N/A Date Acquired: 25 18 N/A Weeks of Treatment: Open Open N/A Wound Status: No No N/A Wound Recurrence: 0.1x0.1x0.1 0.8x1x1 N/A Measurements L x W x D (cm) 0.008 0.628 N/A A (cm) : rea 0.001 0.628 N/A Volume (cm) : 100.00% -185.50% N/A % Reduction in A rea: 100.00% -851.50% N/A % Reduction in Volume: 12 Starting Position 1 (o'clock): 3 Ending Position 1 (o'clock): 1.2 Maximum Distance 1 (cm): No Yes N/A Undermining: Grade 2 Grade 3 N/A Classification: None Present Medium N/A Exudate A mount: N/A Serosanguineous N/A Exudate Type: N/A red, brown N/A Exudate Color: Distinct, outline attached Distinct, outline attached N/A Wound Margin: None Present (0%) Medium (34-66%) N/A Granulation A mount: N/A Pink, Friable N/A Granulation Quality: Small (1-33%) Medium (34-66%) N/A Necrotic A mount: Eschar Adherent Slough N/A Necrotic Tissue: Fat Layer (Subcutaneous Tissue): Yes Fat Layer (Subcutaneous Tissue): Yes  N/A Exposed Structures: Fascia: No Bone: Yes Tendon: No Fascia: No Muscle: No Tendon: No Joint: No Muscle: No Bone: No Joint: No Large (67-100%) Small (1-33%) N/A Epithelialization: Debridement - Excisional N/A N/A Debridement: Pre-procedure Verification/Time Out 11:15 N/A N/A Taken: Lidocaine 4% Topical Solution N/A N/A Pain Control: Necrotic/Eschar, Callus, N/A N/A Tissue Debrided: Subcutaneous, Slough Skin/Subcutaneous Tissue N/A N/A Level: 6 N/A N/A Debridement A (sq cm): rea Curette N/A N/A Instrument: Minimum N/A N/A Bleeding: Pressure N/A N/A Hemostasis Achieved: 0 N/A N/A Procedural Pain: 0 N/A N/A Post Procedural Pain: Debridement Treatment Response: Procedure was tolerated well N/A N/A Post Debridement Measurements L x 3x2x0.1 N/A N/A W x D (cm) 0.471 N/A N/A Post Debridement Volume: (cm) Excoriation: No Callus: Yes N/A Periwound Skin Texture: Induration: No Excoriation: No Callus: No Induration: No Crepitus: No Crepitus: No Rash: No Rash: No Scarring: No Scarring: No Dry/Scaly: Yes Dry/Scaly: Yes N/A Periwound Skin Moisture: Maceration: No Maceration: No Atrophie Blanche: No Atrophie Blanche: No N/A Periwound Skin Color: Cyanosis: No Cyanosis: No Ecchymosis: No Ecchymosis: No Erythema: No Erythema: No Hemosiderin Staining: No Hemosiderin Staining: No Mottled: No Mottled: No Pallor: No Pallor: No Rubor: No Rubor: No Cellular or Tissue Based Product N/A N/A Procedures Performed: Debridement Treatment Notes Wound #1 (Achilles) Wound Laterality: Right Cleanser Soap and Water Discharge Instruction: May shower and wash wound with dial antibacterial soap and water prior to dressing change. Wound Cleanser Discharge Instruction: Cleanse the wound with wound cleanser prior to applying a clean dressing using gauze sponges, not tissue or cotton balls. Peri-Wound Care Sween Lotion (Moisturizing lotion) MONTRELLE, EDDINGS A  ([id number]) [id number] Page 4 of 10 Discharge Instruction: Apply moisturizing lotion as directed Topical Primary Dressing Grafix #7 Discharge Instruction: applied by Diamantina Edinger. adpatic and steri-strips Discharge Instruction: to secure grafix in place. do not remove. Secondary Dressing ABD Pad, 5x9 Discharge Instruction: Apply over primary dressing as directed. Woven Gauze Sponge, Non-Sterile 4x4 in Discharge Instruction: Apply over primary dressing as directed. Secured With Compression Wrap Kerlix Roll 4.5x3.1 (in/yd) Discharge Instruction: Apply Kerlix and Coban compression as directed. Coban Self-Adherent Wrap 4x5 (in/yd) Discharge Instruction: Apply over Kerlix as directed. Compression Stockings Add-Ons Wound #2 (Foot) Wound Laterality: Plantar, Left Cleanser Wound Cleanser Discharge Instruction: Cleanse the wound with wound cleanser prior to applying a clean dressing using gauze sponges, not tissue or cotton balls. Peri-Wound Care Skin Prep Discharge Instruction: Use skin prep as directed Topical Primary Dressing Dakin's Solution  0.25%, 16 (oz) Discharge Instruction: Moisten gauze with Dakin's solution Secondary Dressing Optifoam Non-Adhesive Dressing, 4x4 in Discharge Instruction: Apply over primary dressing as directed. Woven Gauze Sponge, Non-Sterile 4x4 in Discharge Instruction: Apply over primary dressing as directed. Woven Gauze Sponges 2x2 in Discharge Instruction: Apply over primary dressing as directed. Secured With Conforming Stretch Gauze Bandage, Sterile 2x75 (in/in) Discharge Instruction: Secure with stretch gauze as directed. 6M Medipore H Soft Cloth Surgical T ape, 4 x 10 (in/yd) Discharge Instruction: Secure with tape as directed. Compression Wrap Compression Stockings Add-Ons Electronic Signature(s) Signed: 11/24/2022 2:51:07 PM By: Geralyn Corwin DO Entered By: Geralyn Corwin on 11/24/2022 11:55:57 [name]  ([id number]) [id number] Page 5 of 10 -------------------------------------------------------------------------------- Multi-Disciplinary Care Plan Details Patient Name: Date of Service: [name] [facility] A. 11/24/2022 10:15 A M Medical Record Number: [id number] Patient Account Number: [id number] Date of Birth/Sex: Treating RN: [birth year] (72 y.o. [name] Primary Care Liberato Stansbery: Richarda Blade Other Clinician: Referring Biridiana Twardowski: Treating Dorsie Sethi/Extender: Geralyn Corwin Ngetich, [name] in Treatment: 17 Active Inactive Nutrition Nursing Diagnoses: Potential for alteratiion in Nutrition/Potential for imbalanced nutrition Goals: Patient/caregiver agrees to and verbalizes understanding of need to use nutritional supplements and/or vitamins as prescribed Date Initiated: 07/16/2022 Target Resolution Date: 01/08/2023 Goal Status: Active Patient/caregiver will maintain therapeutic glucose control Date Initiated: 07/16/2022 Target Resolution Date: 01/08/2023 Goal Status: Active Interventions: Assess HgA1c results as ordered upon admission and as needed Provide education on nutrition Treatment Activities: Education provided on Nutrition : 10/19/2022 Obtain HgA1c : 07/16/2022 Patient referred to Primary Care Physician for further nutritional evaluation : 07/16/2022 Notes: Pain, Acute or Chronic Nursing Diagnoses: Pain, acute or chronic: actual or potential Potential alteration in comfort, pain Goals: Patient will verbalize adequate pain control and receive pain control interventions during procedures as needed Date Initiated: 07/16/2022 Target Resolution Date: 01/08/2023 Goal Status: Active Interventions: Encourage patient to take pain medications as prescribed Provide education on pain management Reposition patient for comfort Treatment Activities: Administer pain control measures as ordered : 07/16/2022 Notes: Electronic Signature(s) Signed: 11/24/2022  4:14:29 PM By: Shawn Stall RN, BSN Entered By: Shawn Stall on 11/24/2022 11:05:59 [name] A ([id number]) [id number] Page 6 of 10 -------------------------------------------------------------------------------- Pain Assessment Details Patient Name: Date of Service: [name] [facility] A. 11/24/2022 10:15 A M Medical Record Number: [id number] Patient Account Number: [id number] Date of Birth/Sex: Treating RN: [birth year] (72 y.o. [name] Primary Care Shella Lahman: Richarda Blade Other Clinician: Referring Oliva Montecalvo: Treating Otillia Cordone/Extender: Geralyn Corwin Ngetich, [name] in Treatment: 19 Active Problems Location of Pain Severity and Description of Pain Patient Has Paino No Site Locations Rate the pain. Current Pain Level: 0 Pain Management and Medication Current Pain Management: Medication: No Cold Application: No Rest: No Massage: No Activity: No T.E.N.S.: No Heat Application: No Leg drop or elevation: No Is the Current Pain Management Adequate: Adequate How does your wound impact your activities of daily livingo Sleep: No Bathing: No Appetite: No Relationship With Others: No Bladder Continence: No Emotions: No Bowel Continence: No Work: No Toileting: No Drive: No Dressing: No Hobbies: No Electronic Signature(s) Signed: 11/24/2022 4:14:29 PM By: Shawn Stall RN, BSN Entered By: Shawn Stall on 11/24/2022 10:50:41 -------------------------------------------------------------------------------- Patient/Caregiver Education Details Patient Name: Date of Service: Criss Alvine A. 1/16/2024andnbsp10:15 A M Medical Record Number: [id number] Patient Account Number: [id number] Date of Birth/Gender: Treating RN: 1951/02/17 (72 y.o. [name] Primary Care Physician: Richarda Blade Other Clinician: Referring Physician: Treating Physician/Extender: Geralyn Corwin Ngetich, [name] in  Treatment:  18 Education Assessment Education Provided To: JOAN, AVETISYAN ([id number]) [id number] Page 7 of 10 Patient Education Topics Provided Wound/Skin Impairment: Handouts: Caring for Your Ulcer Methods: Explain/Verbal Responses: Reinforcements needed Electronic Signature(s) Signed: 11/24/2022 4:14:29 PM By: Shawn Stall RN, BSN Entered By: Shawn Stall on 11/24/2022 11:06:16 -------------------------------------------------------------------------------- Wound Assessment Details Patient Name: Date of Service: [name] [facility] A. 11/24/2022 10:15 A M Medical Record Number: [id number] Patient Account Number: [id number] Date of Birth/Sex: Treating RN: Sep 06, 1951 (73 y.o. [name] Primary Care Suresh Audi: Richarda Blade Other Clinician: Referring Houston Zapien: Treating Floella Ensz/Extender: Geralyn Corwin Ngetich, [name] in Treatment: 18 Wound Status Wound Number: 1 Primary Diabetic Wound/Ulcer of the Lower Extremity Etiology: Wound Location: Right Achilles Wound Open Wounding Event: Surgical Injury Status: Date Acquired: 07/16/2022 Comorbid Sleep Apnea, Arrhythmia, Congestive Heart Failure, Hypertension, Weeks Of Treatment: 18 History: Peripheral Arterial Disease, Type II Diabetes Clustered Wound: No Photos Wound Measurements Length: (cm) 0.1 Width: (cm) 0.1 Depth: (cm) 0.1 Area: (cm) 0.008 Volume: (cm) 0.001 % Reduction in Area: 100% % Reduction in Volume: 100% Epithelialization: Large (67-100%) Tunneling: No Undermining: No Wound Description Classification: Grade 2 Wound Margin: Distinct, outline attached Exudate Amount: None Present Foul Odor After Cleansing: No Slough/Fibrino No Wound Bed Granulation Amount: None Present (0%) Exposed Structure Necrotic Amount: Small (1-33%) Fascia Exposed: No Necrotic Quality: Eschar Fat Layer (Subcutaneous Tissue) Exposed: Yes Tendon Exposed: No Muscle Exposed: No Joint Exposed: No Bone  Exposed: No THERRON, SELLS A ([id number]) [id number] Page 8 of 10 Periwound Skin Texture Texture Color No Abnormalities Noted: No No Abnormalities Noted: No Callus: No Atrophie Blanche: No Crepitus: No Cyanosis: No Excoriation: No Ecchymosis: No Induration: No Erythema: No Rash: No Hemosiderin Staining: No Scarring: No Mottled: No Pallor: No Moisture Rubor: No No Abnormalities Noted: No Dry / Scaly: Yes Maceration: No Treatment Notes Wound #1 (Achilles) Wound Laterality: Right Cleanser Soap and Water Discharge Instruction: May shower and wash wound with dial antibacterial soap and water prior to dressing change. Wound Cleanser Discharge Instruction: Cleanse the wound with wound cleanser prior to applying a clean dressing using gauze sponges, not tissue or cotton balls. Peri-Wound Care Sween Lotion (Moisturizing lotion) Discharge Instruction: Apply moisturizing lotion as directed Topical Primary Dressing Grafix #7 Discharge Instruction: applied by Eber Ferrufino. adpatic and steri-strips Discharge Instruction: to secure grafix in place. do not remove. Secondary Dressing ABD Pad, 5x9 Discharge Instruction: Apply over primary dressing as directed. Woven Gauze Sponge, Non-Sterile 4x4 in Discharge Instruction: Apply over primary dressing as directed. Secured With Compression Wrap Kerlix Roll 4.5x3.1 (in/yd) Discharge Instruction: Apply Kerlix and Coban compression as directed. Coban Self-Adherent Wrap 4x5 (in/yd) Discharge Instruction: Apply over Kerlix as directed. Compression Stockings Add-Ons Electronic Signature(s) Signed: 11/24/2022 4:14:29 PM By: Shawn Stall RN, BSN Entered By: Shawn Stall on 11/24/2022 10:58:58 -------------------------------------------------------------------------------- Wound Assessment Details Patient Name: Date of Service: [name] [facility] A. 11/24/2022 10:15 A M Medical Record Number: [id number] Patient  Account Number: [id number] Date of Birth/Sex: Treating RN: Jun 22, 1951 (72 y.o. [name] Primary Care Sarita Hakanson: Richarda Blade Other Clinician: Referring Rovena Hearld: Treating Nikko Goldwire/Extender: Velna Ochs, [name] in Treatment: [address] [place] A ([id number]) [id number] Page 9 of 10 Wound Status Wound Number: 2 Primary Diabetic Wound/Ulcer of the Lower Extremity Etiology: Wound Location: Left, Plantar Foot Wound Open Wounding Event: Gradually Appeared Status: Date Acquired: 03/09/2022 Comorbid Sleep Apnea, Arrhythmia, Congestive Heart Failure, Hypertension, Weeks Of Treatment: 18 History: Peripheral Arterial Disease, Type II Diabetes Clustered Wound: No Photos Wound Measurements  Length: (cm) 0.8 Width: (cm) 1 Depth: (cm) 1 Area: (cm) 0.628 Volume: (cm) 0.628 % Reduction in Area: -185.5% % Reduction in Volume: -851.5% Epithelialization: Small (1-33%) Tunneling: No Undermining: Yes Starting Position (o'clock): 12 Ending Position (o'clock): 3 Maximum Distance: (cm) 1.2 Wound Description Classification: Grade 3 Wound Margin: Distinct, outline attached Exudate Amount: Medium Exudate Type: Serosanguineous Exudate Color: red, brown Foul Odor After Cleansing: No Slough/Fibrino No Wound Bed Granulation Amount: Medium (34-66%) Exposed Structure Granulation Quality: Pink, Friable Fascia Exposed: No Necrotic Amount: Medium (34-66%) Fat Layer (Subcutaneous Tissue) Exposed: Yes Necrotic Quality: Adherent Slough Tendon Exposed: No Muscle Exposed: No Joint Exposed: No Bone Exposed: Yes Periwound Skin Texture Texture Color No Abnormalities Noted: No No Abnormalities Noted: No Callus: Yes Atrophie Blanche: No Crepitus: No Cyanosis: No Excoriation: No Ecchymosis: No Induration: No Erythema: No Rash: No Hemosiderin Staining: No Scarring: No Mottled: No Pallor: No Moisture Rubor: No No Abnormalities Noted: No Dry  / Scaly: Yes Maceration: No Treatment Notes Wound #2 (Foot) Wound Laterality: Plantar, Left Cleanser Wound Cleanser Discharge Instruction: Cleanse the wound with wound cleanser prior to applying a clean dressing using gauze sponges, not tissue or cotton balls. Peri-Wound Care ELIJIAH, MICKLEY A ([id number]) [id number] Page 10 of 10 Skin Prep Discharge Instruction: Use skin prep as directed Topical Primary Dressing Dakin's Solution 0.25%, 16 (oz) Discharge Instruction: Moisten gauze with Dakin's solution Secondary Dressing Optifoam Non-Adhesive Dressing, 4x4 in Discharge Instruction: Apply over primary dressing as directed. Woven Gauze Sponge, Non-Sterile 4x4 in Discharge Instruction: Apply over primary dressing as directed. Woven Gauze Sponges 2x2 in Discharge Instruction: Apply over primary dressing as directed. Secured With Conforming Stretch Gauze Bandage, Sterile 2x75 (in/in) Discharge Instruction: Secure with stretch gauze as directed. 68M Medipore H Soft Cloth Surgical T ape, 4 x 10 (in/yd) Discharge Instruction: Secure with tape as directed. Compression Wrap Compression Stockings Add-Ons Electronic Signature(s) Signed: 11/24/2022 4:14:29 PM By: Deon Pilling RN, BSN Entered By: Deon Pilling on 11/24/2022 10:59:44 -------------------------------------------------------------------------------- Vitals Details Patient Name: Date of Service: [name] 11/24/2022 10:15 A M Medical Record Number: [id number] Patient Account Number: [id number] Date of Birth/Sex: Treating RN: May 14, 1951 (72 y.o. [name] Primary Care Tesia Lybrand: [name] Other Clinician: Referring Yohann Curl: Treating Kerryn Tennant/Extender: [name] Ngetich, [name] in Treatment: 18 Vital Signs Time Taken: 10:50 Temperature (F): 98 Height (in): 68 Pulse (bpm): 103 Weight (lbs): 185 Respiratory Rate (breaths/min): 20 Body Mass Index (BMI):  28.1 Blood Pressure (mmHg): 137/69 Reference Range: 80 - 120 mg / dl Electronic Signature(s) Signed: 11/24/2022 4:14:29 PM By: Deon Pilling RN, BSN Entered By: Deon Pilling on 11/24/2022 10:50:18

## 2022-11-24 NOTE — Progress Notes (Signed)
ALANMICHAEL, [name] A ([id number]) [id number] Page 1 of 13 Visit Report for 11/24/2022 Chief Complaint Document Details Patient Name: Date of Service: [name] [name] A. 11/24/2022 10:15 A M Medical Record Number: [id number] Patient Account Number: [id number] Date of Birth/Sex: Treating RN: 01/19/1951 (72 y.o. M) Primary Care Provider: Richarda Blade Other Clinician: Referring Provider: Treating Provider/Extender: Velna Ochs, [name] in Treatment: 18 Information Obtained from: Patient Chief Complaint 07/16/2022; bilateral lower extremity wounds Electronic Signature(s) Signed: 11/24/2022 2:51:07 PM By: Geralyn Corwin [name] Entered By: Geralyn Corwin on 11/24/2022 11:56:05 -------------------------------------------------------------------------------- Cellular or Tissue Based Product Details Patient Name: Date of Service: [name] [facility] A. 11/24/2022 10:15 A M Medical Record Number: [id number] Patient Account Number: [id number] Date of Birth/Sex: Treating RN: September 13, 1951 (72 y.o. [name] Primary Care Provider: Richarda Blade Other Clinician: Referring Provider: Treating Provider/Extender: Jae Dire in Treatment: 11 Cellular or Tissue Based Product Type Wound #1 Right Achilles Applied to: Performed By: Physician Geralyn Corwin, [name] Cellular or Tissue Based Product Type: Grafix prime Level of Consciousness (Pre-procedure): Awake and Alert Pre-procedure Verification/Time Out Yes - 11:20 Taken: Location: trunk / arms / legs Wound Size (sq cm): 0.01 Product Size (sq cm): 6 Waste Size (sq cm): 0 Amount of Product Applied (sq cm): 6 Instrument Used: Forceps, Scissors Lot #: [id number] Order #: [id number] Expiration Date: 03/23/2024 Fenestrated: No Reconstituted: Yes Solution Type: normal saline Solution Amount: 80mL Lot #: [id number] Solution Expiration Date: 04/08/2025 Secured: Yes Secured With:  Steri-Strips, adaptic Dressing Applied: No Procedural Pain: 0 Post Procedural Pain: 0 Response to Treatment: Procedure was tolerated well Level of Consciousness (Post- Awake and Alert procedure): CHANG, TIGGS A ([id number]) [id number] Page 2 of 13 Post Procedure Diagnosis Same as Pre-procedure Electronic Signature(s) Signed: 11/24/2022 2:51:07 PM By: Geralyn Corwin [name] Signed: 11/24/2022 4:14:29 PM By: Shawn Stall RN, BSN Entered By: Shawn Stall on 11/24/2022 11:30:53 -------------------------------------------------------------------------------- Debridement Details Patient Name: Date of Service: [name] [facility] A. 11/24/2022 10:15 A M Medical Record Number: [id number] Patient Account Number: [id number] Date of Birth/Sex: Treating RN: 1951/06/03 (72 y.o. [name] Primary Care Provider: Richarda Blade Other Clinician: Referring Provider: Treating Provider/Extender: Geralyn Corwin Ngetich, [name] in Treatment: 18 Debridement Performed for Assessment: Wound #1 Right Achilles Performed By: Physician Geralyn Corwin, [name] Debridement Type: Debridement Severity of Tissue Pre Debridement: Fat layer exposed Level of Consciousness (Pre-procedure): Awake and Alert Pre-procedure Verification/Time Out Yes - 11:15 Taken: Start Time: 11:16 Pain Control: Lidocaine 4% T opical Solution T Area Debrided (L x W): otal 3 (cm) x 2 (cm) = 6 (cm) Tissue and other material debrided: Viable, Non-Viable, Callus, Eschar, Slough, Subcutaneous, Skin: Dermis , Skin: Epidermis, Slough Level: Skin/Subcutaneous Tissue Debridement Description: Excisional Instrument: Curette Bleeding: Minimum Hemostasis Achieved: Pressure End Time: 11:19 Procedural Pain: 0 Post Procedural Pain: 0 Response to Treatment: Procedure was tolerated well Level of Consciousness (Post- Awake and Alert procedure): Post Debridement Measurements of Total Wound Length: (cm)  3 Width: (cm) 2 Depth: (cm) 0.1 Volume: (cm) 0.471 Character of Wound/Ulcer Post Debridement: Improved Severity of Tissue Post Debridement: Fat layer exposed Post Procedure Diagnosis Same as Pre-procedure Electronic Signature(s) Signed: 11/24/2022 2:51:07 PM By: Geralyn Corwin [name] Signed: 11/24/2022 4:14:29 PM By: Shawn Stall RN, BSN Entered By: Shawn Stall on 11/24/2022 11:25:10 HPI Details -------------------------------------------------------------------------------- [name] ([id number]) [id number] Page 3 of 13 Patient Name: Date of Service: [name] [facility] A. 11/24/2022 10:15 A M Medical Record Number: [id number] Patient  Account Number: [id number] Date of Birth/Sex: Treating RN: [birth year] (72 y.o. 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 He is not sure how the wounds started. He has been following with podiatry for this issue. He had removal of the tendon to the right lower extremity by Dr. Blenda Mounts. He has been using Betadine to the wound beds. On 06/11/2022 he had a right posterior tibial artery angioplasty by Dr. Carlis Abbott. It was reported the patient is optimized after revascularization of the right lower extremity. His ABIs on the left were 0.91. He currently denies systemic signs of infection. He also does not wear shoes. He came in with Kerlix  wrap to his feet bilaterally. This did not completely cover his feet. 07/23/2022: This is a patient of Dr. Jodene Nam that she asked me to take a look at last week due to the significant involvement of the muscle and tendon on his right posterior leg wound. He needed an aggressive debridement and asked me if I would be able to perform this on her behalf. The patient is here today for that procedure. When his dressing was removed in clinic today, the wound was teeming with maggots. There is necrotic muscle, tendon, and fat, with a thick layer of slough on the wound. 9/21; patient presents for follow-up. He was debrided by Dr. Celine Ahr at last clinic visit without any issues. He has been placing Dakin's wet-to-dry dressings to the right posterior wound. He has been using Medihoney to the left foot wound. He reports improvement in wound healing. He has no issues or complaints today. 9/28; patient with type 2 diabetes PAD status post revascularization. He underwent retrograde right PTA angioplasty. Last saw Dr. Carlis Abbott on 9/12 and was felt to have a brisk posterior tibial signal at the right ankle. He had a pulsatile toe tracing that appeared adequate for healing. He has been using Santyl and Hydrofera Blue on the right Achilles area. He has a smaller area on the left fifth plantar metatarsal head They were apparently in the ER on 9/23 with swelling and discoloration above the wrap. They have a picture of the leg after the wrap was taken off which looks like that it was excessively tight superiorly 10/6; patient presents for follow-up. We have been using Santyl and Hydrofera Blue to the right lower extremity wound under Kerlix/Coban. T the left foot we o have been using silver alginate with Medihoney. He again comes in with no shoes. He has no issues or complaints today. 10/12; patient presents for follow-up. We have been using Santyl and Hydrofera Blue to the right lower extremity under Kerlix/Coban And  silver alginate to the left foot wound. He has no issues today. He denies signs of infection. 10/19; patient presents for follow-up. We continue to use Santyl and Hydrofera Blue to the right lower extremity under Kerlix/Coban and silver alginate to the left foot wound. There is been improvement in wound healing. 10/26; patient presents for follow-up. We have been using Santyl and Hydrofera Blue to the  right lower extremity under Kerlix/Coban and silver alginate to the left foot. There continues to be improvement in wound healing. Patient has no issues or complaints today. 11/2; the patient's area on the right Achilles heel looks quite healthy and is improved in terms of measurements. We have been using Hydrofera Blue. He is approved for Grafix On the left plantar foot wound over the fifth metatarsal head this tunnels over the lateral part of the met head. He is not offloading this at all 11/16; patient presents for follow-up. He has been using a surgical shoe with an offloading felt pad to the left lateral foot along with silver alginate. He has been approved for Grafix and this is available for placement today T the right lower extremity wound. Patient Is agreeable to this. We have been using Santyl and o Hydrofera Blue under Kerlix/Coban to this area. 11/21; patient presents for follow-up. We have been doing Grafix to the right lower extremity and silver alginate to the left foot wound. He has no issues or complaints today. 11/30; patient presents for follow-up. We have been doing Grafix to the right lower extremity wound and Medihoney with Hydrofera Blue to the left lateral foot wound. He reports pain to the left foot wound today. He did not obtain his x-ray. 12/11; patient presents for follow-up. He has been using Dakin's wet-to-dry dressings to the left plantar foot wound. We have been doing Grafix to the right lower extremity wound under compression therapy. He obtained his x-ray that showed  mild periosteal elevation at the resection site with the possibility of osteomyelitis. He currently denies systemic signs of infection. He has been taking doxycycline and Augmentin without issues. He is getting his INR checked by his primary care office. 12/18; patient presents for follow-up. He has been using Dakin's wet-to-dry dressings to the left foot wound. We have been placing Grafix under compression therapy to the right lower extremity. He is currently taking doxycycline and Augmentin. He denies signs of infection. 12/26; patient presents for follow-up. He has been using Dakin's wet-to-dry dressings to the left foot. He has been off oral antibiotics for potential bone biopsy. We have been placing Grafix under compression therapy to the right lower extremity. There has been improvement in wound healing to [name] sites. He states he is trying to aggressively offload the left foot. He currently denies signs of infection. 1/2; patient presents for follow-up. He has been doing Dakin's wet-to-dry dressings to the left foot. He saw Dr. Ralene Cork today and had a bone biopsy. Debridement was performed on the site. Dressing in place with Dakin's wet-to-dry. T the right leg we have been using Grafix under compression therapy. He o has no issues or complaints today. 1/9; patient presents for follow-up. We have been using Hydrofera Blue under compression therapy to the right lower extremity wound. This is well-healing. He has been using Dakin's wet-to-dry dressings to the left lateral foot wound. He has been taking ciprofloxacin due to culture results without issues. He was referred to infectious disease by podiatry. He does not have an appointment yet. 1/16; patient presents for follow-up. We were using collagen under Kerlix/Coban to the right lower extremity wound. He has been approved for Grafix and this was available for placement today. He continues to use Dakin's wet-to-dry dressings daily to the left  foot. He started levofloxacin. He has an appointment with infectious disease on 1/24. Electronic Signature(s) ANTWOINE, ZORN A ([id number]) [id number] Page 4 of 13 Signed: 11/24/2022 2:51:07 PM By: Geralyn Corwin  [name] Entered By: Geralyn Corwin on 11/24/2022 11:57:34 -------------------------------------------------------------------------------- Physical Exam Details Patient Name: Date of Service: [place] [name] [facility] A. 11/24/2022 10:15 A M Medical Record Number: [id number] Patient Account Number: [id number] Date of Birth/Sex: Treating RN: December 24, 1950 (72 y.o. M) Primary Care Provider: Richarda Blade Other Clinician: Referring Provider: Treating Provider/Extender: Geralyn Corwin Ngetich, [name] in Treatment: 18 Constitutional respirations regular, non-labored and within target range for patient.. Cardiovascular 2+ dorsalis pedis/posterior tibialis pulses. Psychiatric pleasant and cooperative. Notes Right lower extremity: T the posterior aspect there is an open wound with granulation tissue and nonviable tissue. Venous stasis dermatitis. T the left lower o o extremity there is An open wound that undermines and probes to bone. No surrounding signs of soft tissue infection. Electronic Signature(s) Signed: 11/24/2022 2:51:07 PM By: Geralyn Corwin [name] Entered By: Geralyn Corwin on 11/24/2022 11:58:01 -------------------------------------------------------------------------------- Physician Orders Details Patient Name: Date of Service: [name] [facility] A. 11/24/2022 10:15 A M Medical Record Number: [id number] Patient Account Number: [id number] Date of Birth/Sex: Treating RN: [birth year] (72 y.o. 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 - w/ Dr. Mikey Bussing 12/01/2022 2pm Return A Other: - keep Pulmonologist appt at end of February. Continue oral antibiotics till you see Infectious Disease. Ensure to see Vein and Vascular today for follow up tests. Anesthetic MANSEL, STROTHER A ([id number]) [id number] Page 5 of 13 (In clinic) Topical Lidocaine 5% applied to wound bed Cellular or Tissue Based Products Wound #1 Right Achilles Cellular or Tissue Based Product Type: - Grafix approved-waiting to hear about copay or coinsurance-cost for you out of your pocket. Kerecsis denied by insurance. Grafix #1 applied on 09/24/22 Grafix #2 applied on 09/29/2022 Grafix # 3 applied on 10/07/22 Grafix # 4 applied on 10/19/22 Grafix # 5 applied 10/26/2022 Grafix applied to right achilles wound 11/03/2022 Grafix pending insurance approval 11/24/2022 Grafix approved from now to 02/14/2023 total 12. #7 applied. Cellular or Tissue Based Product applied to wound bed, secured with steri-strips, cover with Adaptic or Mepitel. ([name] NOT REMOVE). Edema Control - Lymphedema / SCD / Other Avoid standing for long periods of time. Off-Loading Open toe surgical shoe to: - ****put PegAssist in surgical shoe.**** Prevalon Boot - left foot while resting in bed at night. Other: - minimize walking and standing to aid in offloading pressure to wound. Hyperbaric Oxygen Therapy Wound #2 Left,Plantar Foot Evaluate for HBO Therapy Indication: - Wagner grade 3 If  appropriate for treatment, begin HBOT per protocol: 2.0 ATA for 90 Minutes with 2 Five (5) Minute A Breaks ir Total Number of Treatments: - 40 One treatments per day (delivered Monday through Friday unless otherwise specified in Special Instructions below): Finger stick Blood Glucose Pre- and Post- HBOT Treatment. Follow Hyperbaric Oxygen Glycemia Protocol A frin (Oxymetazoline HCL) 0.05% nasal spray - 1 spray in [name] nostrils daily as needed prior to HBO treatment for difficulty clearing ears Wound Treatment Wound #1 - Achilles Wound Laterality:  Right Cleanser: Soap and Water 1 x Per Week/30 Days Discharge Instructions: May shower and wash wound with dial antibacterial soap and water prior to dressing change. Cleanser: Wound Cleanser 1 x Per Week/30 Days Discharge Instructions: Cleanse the wound with wound cleanser prior to applying a clean dressing using gauze sponges, not tissue or cotton balls. Peri-Wound Care: Sween Lotion (Moisturizing lotion) 1 x Per Week/30 Days Discharge Instructions: Apply moisturizing lotion as directed Prim Dressing: Grafix #7 1 x Per Week/30 Days ary Discharge Instructions: applied by provider. Prim Dressing: adpatic and steri-strips 1 x Per Week/30 Days ary Discharge Instructions: to secure grafix in place. [name] not remove. Secondary Dressing: ABD Pad, 5x9 1 x Per Week/30 Days Discharge Instructions: Apply over primary dressing as directed. Secondary Dressing: Woven Gauze Sponge, Non-Sterile 4x4 in 1 x Per Week/30 Days Discharge Instructions: Apply over primary dressing as directed. Compression Wrap: Kerlix Roll 4.5x3.1 (in/yd) 1 x Per Week/30 Days Discharge Instructions: Apply Kerlix and Coban compression as directed. Compression Wrap: Coban Self-Adherent Wrap 4x5 (in/yd) 1 x Per Week/30 Days Discharge Instructions: Apply over Kerlix as directed. Wound #2 - Foot Wound Laterality: Plantar, Left Cleanser: Wound Cleanser 1 x Per Day/30 Days Discharge  Instructions: Cleanse the wound with wound cleanser prior to applying a clean dressing using gauze sponges, not tissue or cotton balls. Peri-Wound Care: Skin Prep (Generic) 1 x Per Day/30 Days Discharge Instructions: Use skin prep as directed Prim Dressing: Dakin's Solution 0.25%, 16 (oz) 1 x Per Day/30 Days ary Discharge Instructions: Moisten gauze with Dakin's solution Secondary Dressing: Optifoam Non-Adhesive Dressing, 4x4 in (Generic) 1 x Per Day/30 Days Discharge Instructions: Apply over primary dressing as directed. Secondary Dressing: Woven Gauze Sponge, Non-Sterile 4x4 in (Generic) 1 x Per Day/30 Days Joette CatchingODRONEIC, Yves A ([id number]) [id number] Page 6 of 13 Discharge Instructions: Apply over primary dressing as directed. Secondary Dressing: Woven Gauze Sponges 2x2 in (Generic) 1 x Per Day/30 Days Discharge Instructions: Apply over primary dressing as directed. Secured With: Insurance underwriterConforming Stretch Gauze Bandage, Sterile 2x75 (in/in) (Generic) 1 x Per Day/30 Days Discharge Instructions: Secure with stretch gauze as directed. Secured With: 42M Medipore H Soft Cloth Surgical T ape, 4 x 10 (in/yd) (Generic) 1 x Per Day/30 Days Discharge Instructions: Secure with tape as directed. Patient Medications llergies: No Known Drug Allergies A Notifications Medication Indication Start End 11/24/2022 levofloxacin DOSE 1 - oral 750 mg tablet - 1 tablet oral once daily x 7 days GLYCEMIA INTERVENTIONS PROTOCOL PRE-HBO GLYCEMIA INTERVENTIONS ACTION INTERVENTION Obtain pre-HBO capillary blood glucose (ensure 1 physician order is in chart). A. Notify HBO physician and await physician orders. 2 If result is 70 mg/dl or below: B. If the result meets the hospital definition of a critical result, follow hospital policy. A. Give patient an 8 ounce Glucerna Shake, an 8 ounce Ensure, or 8 ounces of a Glucerna/Ensure equivalent dietary supplement*. B. Wait 30 minutes. If  result is 71 mg/dl to 409130 mg/dl: C. Retest patients capillary blood glucose (CBG). D. If result greater than or equal to 110 mg/dl, proceed with HBO. If result less than 110 mg/dl, notify HBO physician and consider holding HBO. If result is 131 mg/dl to 811249 mg/dl: A. Proceed with HBO. A. Notify HBO physician and await physician orders. B. It is recommended to hold HBO and [name] If result is 250 mg/dl or greater: blood/urine ketone testing. C. If the result meets the hospital definition of a critical result, follow hospital policy. POST-HBO GLYCEMIA INTERVENTIONS ACTION INTERVENTION Obtain post HBO capillary  blood glucose (ensure 1 physician order is in chart). A. Notify HBO physician and await physician orders. 2 If result is 70 mg/dl or below: B. If the result meets the hospital definition of a critical result, follow hospital policy. A. Give patient an 8 ounce Glucerna Shake, an 8 ounce Ensure, or 8 ounces of a Glucerna/Ensure equivalent dietary supplement*. B. Wait 15 minutes for symptoms of If result is 71 mg/dl to 557 mg/dl: hypoglycemia (i.e. nervousness, anxiety, sweating, chills, clamminess, irritability, confusion, tachycardia or dizziness). C. If patient asymptomatic, discharge patient. If patient symptomatic, repeat capillary blood glucose (CBG) and notify HBO physician. If result is 101 mg/dl to 322 mg/dl: A. Discharge patient. A. Notify HBO physician and await physician orders. B. It is recommended to [name] blood/urine ketone If result is 250 mg/dl or greater: testing. C. If the result meets the hospital definition of a critical result, follow hospital policy. *Juice or candies are NOT equivalent products. If patient refuses the Glucerna or Ensure, please consult the hospital dietitian for an appropriate substitute. Electronic Signature(s) Signed: 11/24/2022 2:51:07 PM By: Geralyn Corwin [name] Previous Signature: 11/24/2022 11:46:04 AM Version By: Geralyn Corwin [name] Entered By: Geralyn Corwin on 11/24/2022 11:58:14 [name] ([id number]) [id number] Page 7 of 13 -------------------------------------------------------------------------------- Problem List Details Patient Name: Date of Service: [name] [facility] A. 11/24/2022 10:15 A M Medical Record Number: [id number] Patient Account Number: [id number] Date of Birth/Sex: Treating RN: November 02, 1951 (72 y.o. [name] Primary Care Provider: Richarda Blade Other Clinician: Referring Provider: Treating Provider/Extender: Velna Ochs, [name] in Treatment: 72 Active Problems ICD-10 Encounter Code Description Active Date MDM Diagnosis L97.518 Non-pressure chronic ulcer of other part of right foot with other specified 07/16/2022 No Yes severity L97.522 Non-pressure chronic ulcer of other part of left foot with fat layer exposed 07/16/2022 No Yes E11.621 Type 2 diabetes mellitus with foot ulcer 07/16/2022 No Yes I73.9 Peripheral vascular disease, unspecified 07/16/2022 No Yes 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 07/23/2022 No Yes muscle M86.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, left ankle and foot 11/10/2022 No Yes Inactive Problems Resolved Problems Electronic Signature(s) Signed: 11/24/2022 2:51:07 PM By: Geralyn Corwin [name] Entered By: Geralyn Corwin on 11/24/2022 11:55:52 -------------------------------------------------------------------------------- Progress Note Details Patient Name: Date of Service: [name] [name] 11/24/2022 10:15 A M Medical Record Number: [id number] Patient Account Number: [id number] Date of Birth/Sex: Treating RN: 02-01-1951 (72 y.o. M) Primary Care Provider: Richarda Blade Other Clinician: Referring Provider: Treating Provider/Extender: Velna Ochs, [name] in Treatment: [address] [place] A ([id number])  [id number] Page 8 of 13 Chief Complaint Information obtained from Patient 07/16/2022; bilateral lower extremity wounds History of Present Illness (HPI) Admission 07/16/2022 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 He is not sure how the wounds started. He has been following with podiatry for this issue. He had removal of the tendon to the right lower extremity by Dr. Ralene Cork. He has been using Betadine to the wound beds. On 06/11/2022 he had a right posterior tibial artery angioplasty by Dr. Chestine Spore. It was reported the patient is optimized after revascularization of the right  lower extremity. His ABIs on the left were 0.91. He currently denies systemic signs of infection. He also does not wear shoes. He came in with Kerlix wrap to his feet bilaterally. This did not completely cover his feet. 07/23/2022: This is a patient of Dr. Neita Garnet that she asked me to take a look at last week due to the significant involvement of the muscle and tendon on his right posterior leg wound. He needed an aggressive debridement and asked me if I would be able to perform this on her behalf. The patient is here today for that procedure. When his dressing was removed in clinic today, the wound was teeming with maggots. There is necrotic muscle, tendon, and fat, with a thick layer of slough on the wound. 9/21; patient presents for follow-up. He was debrided by Dr. Lady Gary at last clinic visit without any issues. He has been placing Dakin's wet-to-dry dressings to the right posterior wound. He has been using Medihoney to the left  foot wound. He reports improvement in wound healing. He has no issues or complaints today. 9/28; patient with type 2 diabetes PAD status post revascularization. He underwent retrograde right PTA angioplasty. Last saw Dr. Chestine Spore on 9/12 and was felt to have a brisk posterior tibial signal at the right ankle. He had a pulsatile toe tracing that appeared adequate for healing. He has been using Santyl and Hydrofera Blue on the right Achilles area. oo He has a smaller area on the left fifth plantar metatarsal head They were apparently in the ER on 9/23 with swelling and discoloration above the wrap. They have a picture of the leg after the wrap was taken off which looks like that it was excessively tight superiorly 10/6; patient presents for follow-up. We have been using Santyl and Hydrofera Blue to the right lower extremity wound under Kerlix/Coban. T the left foot we o have been using silver alginate with Medihoney. He again comes in with no shoes. He has no issues or complaints today. 10/12; patient presents for follow-up. We have been using Santyl and Hydrofera Blue to the right lower extremity under Kerlix/Coban And silver alginate to the left foot wound. He has no issues today. He denies signs of infection. 10/19; patient presents for follow-up. We continue to use Santyl and Hydrofera Blue to the right lower extremity under Kerlix/Coban and silver alginate to the left foot wound. There is been improvement in wound healing. 10/26; patient presents for follow-up. We have been using Santyl and Hydrofera Blue to the right lower extremity under Kerlix/Coban and silver alginate to the left foot. There continues to be improvement in wound healing. Patient has no issues or complaints today. 11/2; the patient's area on the right Achilles heel looks quite healthy and is improved in terms of measurements. We have been using Hydrofera Blue. He is approved for Grafix On the left plantar foot wound over the  fifth metatarsal head this tunnels over the lateral part of the met head. He is not offloading this at all 11/16; patient presents for follow-up. He has been using a surgical shoe with an offloading felt pad to the left lateral foot along with silver alginate. He has been approved for Grafix and this is available for placement today T the right lower extremity wound. Patient Is agreeable to this. We have been using Santyl and o Hydrofera Blue under Kerlix/Coban to this area. 11/21; patient presents for follow-up. We have been doing Grafix to the right lower extremity and silver alginate to  the left foot wound. He has no issues or complaints today. 11/30; patient presents for follow-up. We have been doing Grafix to the right lower extremity wound and Medihoney with Hydrofera Blue to the left lateral foot wound. He reports pain to the left foot wound today. He did not obtain his x-ray. 12/11; patient presents for follow-up. He has been using Dakin's wet-to-dry dressings to the left plantar foot wound. We have been doing Grafix to the right lower extremity wound under compression therapy. He obtained his x-ray that showed mild periosteal elevation at the resection site with the possibility of osteomyelitis. He currently denies systemic signs of infection. He has been taking doxycycline and Augmentin without issues. He is getting his INR checked by his primary care office. 12/18; patient presents for follow-up. He has been using Dakin's wet-to-dry dressings to the left foot wound. We have been placing Grafix under compression therapy to the right lower extremity. He is currently taking doxycycline and Augmentin. He denies signs of infection. 12/26; patient presents for follow-up. He has been using Dakin's wet-to-dry dressings to the left foot. He has been off oral antibiotics for potential bone biopsy. We have been placing Grafix under compression therapy to the right lower extremity. There has been  improvement in wound healing to [name] sites. He states he is trying to aggressively offload the left foot. He currently denies signs of infection. 1/2; patient presents for follow-up. He has been doing Dakin's wet-to-dry dressings to the left foot. He saw Dr. Ralene CorkSikora today and had a bone biopsy. Debridement was performed on the site. Dressing in place with Dakin's wet-to-dry. T the right leg we have been using Grafix under compression therapy. He o has no issues or complaints today. 1/9; patient presents for follow-up. We have been using Hydrofera Blue under compression therapy to the right lower extremity wound. This is well-healing. He has been using Dakin's wet-to-dry dressings to the left lateral foot wound. He has been taking ciprofloxacin due to culture results without issues. He was referred to infectious disease by podiatry. He does not have an appointment yet. 1/16; patient presents for follow-up. We were using collagen under Kerlix/Coban to the right lower extremity wound. He has been approved for Grafix and this was available for placement today. He continues to use Dakin's wet-to-dry dressings daily to the left foot. He started levofloxacin. He has an appointment with infectious disease on 1/24. Patient History Information obtained from Chart. Family History Diabetes - Mother, Heart Disease - Mother,Siblings. Social History Darren BothODRONEIC, Tovia A ([id number]) [id number] Page 9 of 13 Current every day smoker, Alcohol Use - Never, Drug Use - No History, Caffeine Use - Never. Medical History Respiratory Patient has history of Sleep Apnea - does not wear Cardiovascular Patient has history of Arrhythmia - A. Fib, Congestive Heart Failure - EF 25%, Hypertension, Peripheral Arterial Disease Denies history of Peripheral Venous Disease Endocrine Patient has history of Type II Diabetes Hospitalization/Surgery History - Angiogram 06/11/2022 Dr. Chestine [facility]ark [facility]. -  cardioversion 11/06/2021, 2015, 2017. 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 Medical A Surgical History Notes nd Gastrointestinal Ascites Genitourinary stage III Kidney disease Objective Constitutional respirations regular, non-labored and within target range for patient.. Vitals Time Taken: 10:50 AM, Height: 68 in, Weight: 185 lbs, BMI: 28.1, Temperature: 98 F, Pulse: 103 bpm, Respiratory Rate: 20 breaths/min, Blood Pressure: 137/69 mmHg. Cardiovascular 2+ dorsalis pedis/posterior tibialis pulses. Psychiatric pleasant and cooperative. General Notes: Right lower extremity: T the posterior aspect there is an open wound with granulation tissue  and nonviable tissue. Venous stasis dermatitis. T o o the left lower extremity there is An open wound that undermines and probes to bone. No surrounding signs of soft tissue infection. Integumentary (Hair, Skin) Wound #1 status is Open. Original cause of wound was Surgical Injury. The date acquired was: 07/16/2022. The wound has been in treatment 18 weeks. The wound is located on the Right Achilles. The wound measures 0.1cm length x 0.1cm width x 0.1cm depth; 0.008cm^2 area and 0.001cm^3 volume. There is Fat Layer (Subcutaneous Tissue) exposed. There is no tunneling or undermining noted. There is a none present amount of drainage noted. The wound margin is distinct with the outline attached to the wound base. There is no granulation within the wound bed. There is a small (1-33%) amount of necrotic tissue within the wound bed including Eschar. The periwound skin appearance exhibited: Dry/Scaly. The periwound skin appearance did not exhibit: Callus, Crepitus, Excoriation, Induration, Rash, Scarring, Maceration, Atrophie Blanche, Cyanosis, Ecchymosis, Hemosiderin Staining, Mottled, Pallor, Rubor, Erythema. Wound #2 status is Open. Original cause of wound was Gradually Appeared. The date acquired was: 03/09/2022. The wound has been in treatment 18 weeks.  The wound is located on the Left,Plantar Foot. The wound measures 0.8cm length x 1cm width x 1cm depth; 0.628cm^2 area and 0.628cm^3 volume. There is bone and Fat Layer (Subcutaneous Tissue) exposed. There is no tunneling noted, however, there is undermining starting at 12:00 and ending at 3:00 with a maximum distance of 1.2cm. There is a medium amount of serosanguineous drainage noted. The wound margin is distinct with the outline attached to the wound base. There is medium (34-66%) pink, friable granulation within the wound bed. There is a medium (34-66%) amount of necrotic tissue within the wound bed including Adherent Slough. The periwound skin appearance exhibited: Callus, Dry/Scaly. The periwound skin appearance did not exhibit: Crepitus, Excoriation, Induration, Rash, Scarring, Maceration, Atrophie Blanche, Cyanosis, Ecchymosis, Hemosiderin Staining, Mottled, Pallor, Rubor, Erythema. 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 I debrided nonviable tissue and Grafix was placed in standard fashion today. Continue Kerlix/Coban. The left lateral foot wound continues to probe to bone. I recommended continuing Dakin's wet-to-dry dressings here. At last clinic visit I continued oral antibiotics. Levofloxacin was prescribed twice daily however patient was notified to only take this once daily. I will continue this until his infectious disease appointment on 1/24. His INR was recently checked and states it was between 2oo3. He will of completed 3 weeks of oral antibiotics by 1/24. He is not able to  offload this area. He also came in smelling of cigarette smoke. He has done this for the past several weeks to months. He denied tobacco use however today admitted to smoking cigars. We discussed the importance of smoking cessation in wound healing. Follow-up in 1 week. OLAWALE, MARNEY A ([id number]) [id number] Page 10 of 13 Procedures Wound #1 Pre-procedure diagnosis of Wound #1 is a Diabetic Wound/Ulcer of the Lower Extremity located on the Right Achilles .Severity of Tissue Pre Debridement is: Fat layer exposed. There was a Excisional Skin/Subcutaneous Tissue Debridement with a total area of 6 sq cm performed by Geralyn Corwin, [name]. With the  following instrument(s): Curette to remove Viable and Non-Viable tissue/material. Material removed includes Eschar, Callus, Subcutaneous Tissue, Slough, Skin: Dermis, and Skin: Epidermis after achieving pain control using Lidocaine 4% Topical Solution. A time out was conducted at 11:15, prior to the start of the procedure. A Minimum amount of bleeding was controlled with Pressure. The procedure was tolerated well with a pain level of 0 throughout and a pain level of 0 following the procedure. Post Debridement Measurements: 3cm length x 2cm width x 0.1cm depth; 0.471cm^3 volume. Character of Wound/Ulcer Post Debridement is improved. Severity of Tissue Post Debridement is: Fat layer exposed. Post procedure Diagnosis Wound #1: Same as Pre-Procedure Pre-procedure diagnosis of Wound #1 is a Diabetic Wound/Ulcer of the Lower Extremity located on the Right Achilles. A skin graft procedure using a bioengineered skin substitute/cellular or tissue based product was performed by Geralyn Corwin, [name] with the following instrument(s): Forceps and Scissors. Grafix prime was applied and secured with Steri-Strips and adaptic. 6 sq cm of product was utilized and 0 sq cm was wasted. Post Application, no dressing was applied. A Time Out was  conducted at 11:20, prior to the start of the procedure. The procedure was tolerated well with a pain level of 0 throughout and a pain level of 0 following the procedure. Post procedure Diagnosis Wound #1: Same as Pre-Procedure . Plan Follow-up Appointments: Return Appointment in 1 week. - w/ Dr. Mikey Bussing 12/01/2022 2pm Other: - keep Pulmonologist appt at end of February. Continue oral antibiotics till you see Infectious Disease. Ensure to see Vein and Vascular today for follow up tests. Anesthetic: (In clinic) Topical Lidocaine 5% applied to wound bed Cellular or Tissue Based Products: Wound #1 Right Achilles: Cellular or Tissue Based Product Type: - Grafix approved-waiting to hear about copay or coinsurance-cost for you out of your pocket. Kerecsis denied by insurance. Grafix #1 applied on 09/24/22 Grafix #2 applied on 09/29/2022 Grafix # 3 applied on 10/07/22 Grafix # 4 applied on 10/19/22 Grafix # 5 applied 10/26/2022 Grafix applied to right achilles wound 11/03/2022 Grafix pending insurance approval 11/24/2022 Grafix approved from now to 02/14/2023 total 12. #7 applied. Cellular or Tissue Based Product applied to wound bed, secured with steri-strips, cover with Adaptic or Mepitel. ([name] NOT REMOVE). Edema Control - Lymphedema / SCD / Other: Avoid standing for long periods of time. Off-Loading: Open toe surgical shoe to: - ****put PegAssist in surgical shoe.**** Prevalon Boot - left foot while resting in bed at night. Other: - minimize walking and standing to aid in offloading pressure to wound. Hyperbaric Oxygen Therapy: Wound #2 Left,Plantar Foot: Evaluate for HBO Therapy Indication: - Wagner grade 3 If appropriate for treatment, begin HBOT per protocol: 2.0 ATA for 90 Minutes with 2 Five (5) Minute Air Breaks T Number of Treatments: - 40 otal One treatments per day (delivered Monday through Friday unless otherwise specified in Special Instructions below): Finger stick Blood Glucose  Pre- and Post- HBOT Treatment. Follow Hyperbaric Oxygen Glycemia Protocol Afrin (Oxymetazoline HCL) 0.05% nasal spray - 1 spray in [name] nostrils daily as needed prior to HBO treatment for difficulty clearing ears The following medication(s) was prescribed: levofloxacin oral 750 mg tablet 1 1 tablet oral once daily x 7 days starting 11/24/2022 WOUND #1: - Achilles Wound Laterality: Right Cleanser: Soap and Water 1 x Per Week/30 Days Discharge Instructions: May shower and wash wound with dial antibacterial soap and water prior to dressing change. Cleanser: Wound Cleanser 1 x Per Week/30 Days Discharge Instructions: Cleanse the wound with  wound cleanser prior to applying a clean dressing using gauze sponges, not tissue or cotton balls. Peri-Wound Care: Sween Lotion (Moisturizing lotion) 1 x Per Week/30 Days Discharge Instructions: Apply moisturizing lotion as directed Prim Dressing: Grafix #7 1 x Per Week/30 Days ary Discharge Instructions: applied by provider. Prim Dressing: adpatic and steri-strips 1 x Per Week/30 Days ary Discharge Instructions: to secure grafix in place. [name] not remove. Secondary Dressing: ABD Pad, 5x9 1 x Per Week/30 Days Discharge Instructions: Apply over primary dressing as directed. Secondary Dressing: Woven Gauze Sponge, Non-Sterile 4x4 in 1 x Per Week/30 Days Discharge Instructions: Apply over primary dressing as directed. Com pression Wrap: Kerlix Roll 4.5x3.1 (in/yd) 1 x Per Week/30 Days Discharge Instructions: Apply Kerlix and Coban compression as directed. Com pression Wrap: Coban Self-Adherent Wrap 4x5 (in/yd) 1 x Per Week/30 Days Discharge Instructions: Apply over Kerlix as directed. WOUND #2: - Foot Wound Laterality: Plantar, Left Cleanser: Wound Cleanser 1 x Per Day/30 Days Discharge Instructions: Cleanse the wound with wound cleanser prior to applying a clean dressing using gauze sponges, not tissue or cotton balls. Peri-Wound Care: Skin Prep (Generic) 1 x  Per Day/30 Days Discharge Instructions: Use skin prep as directed Prim Dressing: Dakin's Solution 0.25%, 16 (oz) 1 x Per Day/30 Days ary Discharge Instructions: Moisten gauze with Dakin's solution Secondary Dressing: Optifoam Non-Adhesive Dressing, 4x4 in (Generic) 1 x Per Day/30 Days Discharge Instructions: Apply over primary dressing as directed. Secondary Dressing: Woven Gauze Sponge, Non-Sterile 4x4 in (Generic) 1 x Per Day/30 Days KREED, KAUFFMAN A ([id number]) [id number] Page 11 of 13 Discharge Instructions: Apply over primary dressing as directed. Secondary Dressing: Woven Gauze Sponges 2x2 in (Generic) 1 x Per Day/30 Days Discharge Instructions: Apply over primary dressing as directed. Secured With: Insurance underwriter, Sterile 2x75 (in/in) (Generic) 1 x Per Day/30 Days Discharge Instructions: Secure with stretch gauze as directed. Secured With: 29M Medipore H Soft Cloth Surgical T ape, 4 x 10 (in/yd) (Generic) 1 x Per Day/30 Days Discharge Instructions: Secure with tape as directed. 1. In office sharp debridement 2. Grafix placed in standard fashion 3. Kerlix/Cobanooright lower extremity 4. Continue levofloxacin 5. Follow-up in 1 week 6. Dakin's wet-to-dry dressing Electronic Signature(s) Signed: 11/24/2022 2:51:07 PM By: Geralyn Corwin [name] Entered By: Geralyn Corwin on 11/24/2022 12:02:37 -------------------------------------------------------------------------------- HxROS Details Patient Name: Date of Service: [name] [name] 11/24/2022 10:15 A M Medical Record Number: [id number] Patient Account Number: [id number] Date of Birth/Sex: Treating RN: 01/01/1951 (72 y.o. M) Primary Care Provider: Richarda Blade Other Clinician: Referring Provider: Treating Provider/Extender: Geralyn Corwin Ngetich, [name] in Treatment: 18 Information Obtained From Chart Respiratory Medical History: Positive for: Sleep Apnea - does  not wear Cardiovascular Medical History: Positive for: Arrhythmia - A. Fib; Congestive Heart Failure - EF 25%; Hypertension; Peripheral Arterial Disease Negative for: Peripheral Venous Disease Gastrointestinal Medical History: Past Medical History Notes: Ascites Endocrine Medical History: Positive for: Type II Diabetes Time with diabetes: 6 years Treated with: Insulin Blood sugar tested every day: No Genitourinary Medical History: Past Medical History Notes: stage III Kidney disease Immunizations Pneumococcal Vaccine: Received Pneumococcal Vaccination: No EWING, FANDINO A ([id number]) [id number] Page 12 of 13 Implantable Devices No devices added Hospitalization / Surgery History Type of Hospitalization/Surgery Angiogram 06/11/2022 Dr. 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 11/24/2022 Medical Record Number: [id number] Patient Account Number: [id number] Date of Birth/Sex: Treating RN: [birth year] (72 y.o. 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 M86.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, left ankle and foot Facility Procedures : CPT4 Code: [id number] Description: Q4133- Grafix PL 16 mm disc (2 units) Modifier: Quantity: 6 : CPT4 Code: [id number] Description: 43329 - SKIN SUB GRAFT TRNK/ARM/LEG ICD-10 Diagnosis Description L97.518 Non-pressure chronic ulcer of other part of right foot with other specified sever Modifier: ity Quantity: 1 Physician Procedures : CPT4 Code Description Modifier [id number] 60630 - WC PHYS LEVEL 3 - EST PT ICD-10 Diagnosis Description L97.522 Non-pressure chronic ulcer of other part of left foot with fat layer exposed M86.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, left ankle and foot E11.621  Type 2 diabetes mellitus with foot ulcer Quantity: 1 : [id number] 23557 - WC PHYS SKIN SUB GRAFT TRNK/ARM/LEG ICD-10 Diagnosis Description L97.518 Non-pressure chronic ulcer of other part of right foot with other specified severity Quantity: 1 Electronic Signature(s) Signed: 11/24/2022 2:51:07 PM By: Boris Sharper ([id number]) By: [name] [name] [id number] Page 13 of 13 Signed: 11/24/2022 2:51:07 PM Entered By: [name] on 11/24/2022 12:03:00

## 2022-11-24 NOTE — Progress Notes (Signed)
Patient name: [name] MRN: [id number] DOB: [birth year] Sex: male  REASON FOR CONSULT: 3 month follow-up for PAD/CLI  HPI: JAIQUAN TEMME is a 72 y.o. male, with history of atrial fibrillation, congestive heart failure, CKD, diabetes, hypertension, PVD that presents for 3 month follow-up for wound check.  He recent underwent angiogram on 06/11/22 with right PT angioplasty requiring retrograde pedal access for CLI with tissue loss.  Wound is now being followed by the wound clinic.  The daughter states that this has made significant progress.  They have been placing compression wraps on it now and he gets weekly changes in the wound clinic.  He has previously undergone left peroneal angioplasty on 05/19/2021 for left fifth toe gangrene.  Unfortunately sounds like the left fifth toe now has evidence of osteomyelitis.  Past Medical History:  Diagnosis Date   A-fib [facility])    a. (06/04/14) TEE-DC-CV; succesful; large LA 6.2 cm   ADHD    Ascites    Athlete's foot    Chronic systolic heart failure ([place])    a. 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, unspecified CKD stage, unspecified whether long term insulin use (HCC)    Heart murmur    HTN (hypertension)    OSA (obstructive sleep apnea)    PVD (peripheral vascular disease) (HCC)    s/p great toe amputation    Past Surgical History:  Procedure Laterality Date   ABDOMINAL AORTOGRAM W/LOWER EXTREMITY N/A 05/19/2021   Procedure: ABDOMINAL AORTOGRAM W/LOWER EXTREMITY;  Surgeon: Marty Heck, MD;  Location: [place] [facility];  Service: Cardiovascular;  Laterality: N/A;   ABDOMINAL AORTOGRAM W/LOWER EXTREMITY Right 06/11/2022   Procedure: ABDOMINAL AORTOGRAM W/LOWER EXTREMITY;  Surgeon: Marty Heck, MD;  Location: [place] [facility];  Service: Cardiovascular;  Laterality: Right;   AMPUTATION Left 05/22/2021   Procedure: LEFT FIFTH TOE AMPUTATION;  Surgeon: Marty Heck, MD;  Location: [place];  Service: Vascular;  Laterality: Left;   CARDIOVERSION N/A 06/04/2014   Procedure: CARDIOVERSION;  Surgeon: Larey Dresser, MD;  Location: [facility] ENDOSCOPY;  Service: Cardiovascular;  Laterality: N/A;   CARDIOVERSION N/A 11/06/2021   Procedure: CARDIOVERSION;  Surgeon: Larey Dresser, MD;  Location: [facility] ENDOSCOPY;  Service: Cardiovascular;  Laterality: N/A;   NOSE SURGERY     Nasal septum surgery   PERIPHERAL VASCULAR BALLOON ANGIOPLASTY  06/11/2022   Procedure: PERIPHERAL VASCULAR BALLOON ANGIOPLASTY;  Surgeon: Marty Heck, MD;  Location: [place] [facility];  Service: Cardiovascular;;   PERIPHERAL VASCULAR INTERVENTION Left 05/19/2021   Procedure: PERIPHERAL VASCULAR INTERVENTION;  Surgeon: Marty Heck, MD;  Location: [place] [facility];  Service: Cardiovascular;  Laterality: Left;   TEE WITHOUT CARDIOVERSION N/A 06/04/2014   Procedure: TRANSESOPHAGEAL ECHOCARDIOGRAM (TEE);  Surgeon: Larey Dresser, MD;  Location: [place];  Service: Cardiovascular;  Laterality: N/A;   TEE WITHOUT CARDIOVERSION N/A 01/06/2016   Procedure: TRANSESOPHAGEAL ECHOCARDIOGRAM (TEE);  Surgeon: Fay Records, MD;  Location: [place];  Service: Cardiovascular;  Laterality: N/A;   TEE WITHOUT CARDIOVERSION N/A 11/06/2021   Procedure: TRANSESOPHAGEAL ECHOCARDIOGRAM (TEE);  Surgeon: Larey Dresser, MD;  Location: [facility] ENDOSCOPY;  Service: Cardiovascular;  Laterality: N/A;   VASECTOMY      Family History  Problem Relation Age  of Onset   Heart attack Mother        deceased   Diabetes Mother    Heart attack Sister     SOCIAL HISTORY: Social History   Socioeconomic History   Marital status: Single    Spouse name: Not on file   Number of children: Not on file   Years of education: Not on file   Highest education  level: Not on file  Occupational History   Occupation: retired  Tobacco Use   Smoking status: Some Days    Years: 52.00    Types: Cigarettes, Cigars    Last attempt to quit: 05/2021    Years since quitting: 1.5    Passive exposure: Never   Smokeless tobacco: Never  Vaping Use   Vaping Use: Never used  Substance and Sexual Activity   Alcohol use: Not Currently   Drug use: Never   Sexual activity: Not on file  Other Topics Concern   Not on file  Social History Narrative   ** Merged History Encounter ** Lives in [place] by himself. Retired from [employer] and [employer] for [employer].       Tobacco use, amount per day now:   Past tobacco use, amount per day:   How many years did you use tobacco:   Alcohol use (drinks per week): N/A   Diet:   Do you drink/eat things with caffeine:   Marital status:  Divorced                                What year were you married? 2003   Do you live in a house, apartment, assisted living, condo, trailer, etc.? House   Is it one or more stories? One   How many persons live in your home?   Do you have pets in your home?( please list) N/A   Highest Level of education completed? Bachelors Degree   Current or past profession: Teacher-Special Ed   Do you exercise?  Yes                                Type and how often? Daily squats, and push ups.   Do you have a living will? No   Do you have a DNR form?  No                                 If not, do you want to discuss one?   Do you have signed POA/HPOA forms?  No                      If so, please bring to you appointment      Do you have any difficulty bathing or dressing yourself? Bathing only if pants not shirts/not shorts.   Do you have any difficulty preparing food or eating? No   Do you have any difficulty managing your medications? No   Do you have any difficulty managing your finances? No   Do you have any difficulty affording your medications? No   Social Determinants of Health    Financial Resource Strain: Not on file  Food Insecurity: No Food Insecurity (11/10/2021)   Hunger Vital Sign    Worried About Running Out of Food in the Last Year: Never true  Ran Out of Food in the Last Year: Never true  Transportation Needs: No Transportation Needs (11/10/2021)   PRAPARE - Administrator, Civil Service (Medical): No    Lack of Transportation (Non-Medical): No  Physical Activity: Not on file  Stress: Not on file  Social Connections: Not on file  Intimate Partner Violence: Not on file    No Known Allergies  Current Outpatient Medications  Medication Sig Dispense Refill   amoxicillin-clavulanate (AUGMENTIN) 875-125 MG tablet Take 1 tablet by mouth 2 (two) times daily.     APPLE CIDER VINEGAR PO Take 30 mLs by mouth 4 (four) times a week.     clopidogrel (PLAVIX) 75 MG tablet Take 1 tablet (75 mg total) by mouth daily. 30 tablet 11   diclofenac Sodium (VOLTAREN) 1 % GEL Apply 1 Application topically 4 (four) times daily as needed (pain).     empagliflozin (JARDIANCE) 10 MG TABS tablet TAKE 1 TABLET(10 MG) BY MOUTH DAILY 30 tablet 5   ENTRESTO 49-51 MG TAKE 1 TABLET BY MOUTH TWICE DAILY 180 tablet 2   furosemide (LASIX) 20 MG tablet TAKE 1 TABLET(20 MG) BY MOUTH TWICE DAILY 60 tablet 0   insulin glargine (LANTUS) 100 UNIT/ML injection INJECT 8 UNITS UNDER THE SKIN DAILY 10 mL 11   Insulin Syringe-Needle U-100 (INSULIN SYRINGE 1CC/30GX5/16") 30G X 5/16" 1 ML MISC Use once daily for insulin injections. Dx:E11.22 100 each 1   metoprolol succinate (TOPROL XL) 25 MG 24 hr tablet Take 1 tablet (25 mg total) by mouth daily. 30 tablet 11   Misc Natural Products (JOINT SUPPORT PO) Take 1 Dose by mouth 3 (three) times a week.     spironolactone (ALDACTONE) 25 MG tablet Take 0.5 tablets (12.5 mg total) by mouth daily. 45 tablet 3   warfarin (COUMADIN) 6 MG tablet Take 0.5-1 tablets (3-6 mg total) by mouth See admin instructions. Take 6 mg on Mon and Fri Take 3 mg on  Sun, Tues, Wed, Thurs, and Sat 30 tablet 6   blood glucose meter kit and supplies Dispense based on patient and insurance preference. Use up to four times daily as directed. (FOR ICD-10 E10.9, E11.9). (Patient not taking: Reported on 08/06/2022) 1 each 0   doxycycline (VIBRA-TABS) 100 MG tablet Take 100 mg by mouth 2 (two) times daily. (Patient not taking: Reported on 11/24/2022)     methocarbamol (ROBAXIN) 500 MG tablet Take 1 tablet (500 mg total) by mouth every 8 (eight) hours as needed for muscle spasms. (Patient not taking: Reported on 11/24/2022) 50 tablet 1   traMADol (ULTRAM) 50 MG tablet Take 1 tablet (50 mg total) by mouth every 6 (six) hours as needed for moderate pain. (Patient not taking: Reported on 11/24/2022) 20 tablet 0   No current facility-administered medications for this visit.    REVIEW OF SYSTEMS:  [X]  denotes positive finding, [ ]  denotes negative finding Cardiac  Comments:  Chest pain or chest pressure:    Shortness of breath upon exertion:    Short of breath when lying flat:    Irregular heart rhythm:        Vascular    Pain in calf, thigh, or hip brought on by ambulation:    Pain in feet at night that wakes you up from your sleep:     Blood clot in your veins:    Leg swelling:         Pulmonary    Oxygen at home:    Productive cough:  Wheezing:         Neurologic    Sudden weakness in arms or legs:     Sudden numbness in arms or legs:     Sudden onset of difficulty speaking or slurred speech:    Temporary loss of vision in one eye:     Problems with dizziness:         Gastrointestinal    Blood in stool:     Vomited blood:         Genitourinary    Burning when urinating:     Blood in urine:        Psychiatric    Major depression:         Hematologic    Bleeding problems:    Problems with blood clotting too easily:        Skin    Rashes or ulcers:        Constitutional    Fever or chills:      PHYSICAL EXAM: Vitals:   11/24/22 1447   BP: (!) 140/82  Pulse: (!) 126  Resp: 16  Temp: 97.9 F (36.6 C)  TempSrc: Temporal  SpO2: 95%  Weight: 206 lb (93.4 kg)  Height: 5\' 8"  (1.727 m)    GENERAL: The patient is a well-nourished male, in no acute distress. The vital signs are documented above. CARDIAC: There is a regular rate and rhythm.  VASCULAR:  Both feet wrapped today from the wound clinic.  This was not removed at family request. PULMONARY: No respiratory distress.  DATA:   ABIs are noncompressible, pulsatile toe tracings bilaterally  Assessment/Plan:  72 year old male that most recently underwent right lower extremity arteriogram on 06/11/22 requiring retrograde right PT angioplasty for CLI with tissue loss that presents for 3 month follow-up and wound check.  Sounds like the right leg is making good progress.  There is some discussion about hyperbarics.  Now evidence of osteomyelitis of the left fifth toe.  We previously performed left peroneal angioplasty in 2022 with a left 5th toe amputation that had healed in the past.  I think it is reasonable to consider repeat angiogram with a focus on the left leg given progression of the tissue loss with new bone infection.  I will get this scheduled in the Cath Lab at [facility].  Risks benefits discussed.  [facility], MD [facility] [place] Office: [phone]

## 2022-11-25 ENCOUNTER — Telehealth: Payer: Self-pay

## 2022-11-25 ENCOUNTER — Telehealth (HOSPITAL_COMMUNITY): Payer: Self-pay | Admitting: *Deleted

## 2022-11-25 NOTE — Telephone Encounter (Signed)
Ok to hold warfarin for 3 days prior to angiogram? See note below.   72 year old male that most recently underwent right lower extremity arteriogram on 06/11/22 requiring retrograde right PT angioplasty for CLI with tissue loss that presents for 3 month follow-up and wound check.  Sounds like the right leg is making good progress.  There is some discussion about hyperbarics.  Now evidence of osteomyelitis of the left fifth toe.  We previously performed left peroneal angioplasty in 2022 with a left 5th toe amputation that had healed in the past.  I think it is reasonable to consider repeat angiogram with a focus on the left leg given progression of the tissue loss with new bone infection.  I will get this scheduled in the Cath Lab at [facility].  Risks benefits discussed.   Marty Heck, MD

## 2022-11-25 NOTE — Telephone Encounter (Signed)
Attempted to reach pt to schedule his surgery. Left VM for him to call us back. Additionally, left message with [place] (Dr. Elby Showers office) to see if pt can hold Coumadin x 3 days prior to pv lab case.

## 2022-12-01 ENCOUNTER — Institutional Professional Consult (permissible substitution): Payer: Medicare HMO | Admitting: Internal Medicine

## 2022-12-01 ENCOUNTER — Ambulatory Visit (INDEPENDENT_AMBULATORY_CARE_PROVIDER_SITE_OTHER): Payer: Medicare HMO | Admitting: Podiatry

## 2022-12-01 ENCOUNTER — Encounter (HOSPITAL_BASED_OUTPATIENT_CLINIC_OR_DEPARTMENT_OTHER): Payer: Medicare HMO | Admitting: Internal Medicine

## 2022-12-01 ENCOUNTER — Other Ambulatory Visit: Payer: Self-pay

## 2022-12-01 ENCOUNTER — Encounter: Payer: Self-pay | Admitting: Podiatry

## 2022-12-01 DIAGNOSIS — E1169 Type 2 diabetes mellitus with other specified complication: Secondary | ICD-10-CM | POA: Diagnosis not present

## 2022-12-01 DIAGNOSIS — N1832 Chronic kidney disease, stage 3b: Secondary | ICD-10-CM

## 2022-12-01 DIAGNOSIS — L97512 Non-pressure chronic ulcer of other part of right foot with fat layer exposed: Secondary | ICD-10-CM | POA: Diagnosis not present

## 2022-12-01 DIAGNOSIS — Z794 Long term (current) use of insulin: Secondary | ICD-10-CM | POA: Diagnosis not present

## 2022-12-01 DIAGNOSIS — E11621 Type 2 diabetes mellitus with foot ulcer: Secondary | ICD-10-CM

## 2022-12-01 DIAGNOSIS — E1122 Type 2 diabetes mellitus with diabetic chronic kidney disease: Secondary | ICD-10-CM | POA: Diagnosis not present

## 2022-12-01 DIAGNOSIS — L97813 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: Secondary | ICD-10-CM | POA: Diagnosis not present

## 2022-12-01 DIAGNOSIS — L97424 Non-pressure chronic ulcer of left heel and midfoot with necrosis of bone: Secondary | ICD-10-CM

## 2022-12-01 DIAGNOSIS — L97518 Non-pressure chronic ulcer of other part of right foot with other specified severity: Secondary | ICD-10-CM

## 2022-12-01 DIAGNOSIS — I70223 Atherosclerosis of native arteries of extremities with rest pain, bilateral legs: Secondary | ICD-10-CM

## 2022-12-01 DIAGNOSIS — M86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, left ankle and foot: Secondary | ICD-10-CM

## 2022-12-01 DIAGNOSIS — E11622 Type 2 diabetes mellitus with other skin ulcer: Secondary | ICD-10-CM | POA: Diagnosis not present

## 2022-12-01 DIAGNOSIS — L97522 Non-pressure chronic ulcer of other part of left foot with fat layer exposed: Secondary | ICD-10-CM | POA: Diagnosis not present

## 2022-12-01 DIAGNOSIS — I739 Peripheral vascular disease, unspecified: Secondary | ICD-10-CM

## 2022-12-01 DIAGNOSIS — E1140 Type 2 diabetes mellitus with diabetic neuropathy, unspecified: Secondary | ICD-10-CM | POA: Diagnosis not present

## 2022-12-01 DIAGNOSIS — E1151 Type 2 diabetes mellitus with diabetic peripheral angiopathy without gangrene: Secondary | ICD-10-CM | POA: Diagnosis not present

## 2022-12-01 NOTE — Telephone Encounter (Signed)
Spoke with patient/daughter [name]. Scheduled aortogram with Dr. Carlis Abbott on 12/10/22. Per Dr. Haroldine Laws, [facility] to hold warfarin 3 days as requested. Instructions provided- both pt/daughter verbalized understanding

## 2022-12-01 NOTE — Progress Notes (Signed)
Subjective:  Patient ID: [name], male    DOB: [birth year],   MRN: [id number]  Chief Complaint  Patient presents with   Follow-up    Patient is here for bone biopsy results for left foot.    72 y.o. male presents for follow-up of right leg wound and left foot wound. Patient has been following with vascular and wound care. Bone biopsy taken at last visit and patient currently on levofloxacin for positive pseudomonas. He has appointment with ID tomorrow for plan for antibiotics course.  Appointment with wound care today. Denies any other pedal complaints. Denies n/v/f/c.   Past Medical History:  Diagnosis Date   A-fib [facility])    a. (06/04/14) TEE-DC-CV; succesful; large LA 6.2 cm   ADHD    Ascites    Athlete's foot    Chronic systolic heart failure ([place])    a. 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 CFT <3 seconds. Normal hair growth on digits. No edema.  Skin. No lacerations or abrasions bilateral feet. Right foot dressed in compression followed by wound care. . Pictures appear improved.   Left fifth metatarsal wound  0.5 cm x 0.5 cm x 0.5 cm. No erythema edema or purulence noted. Probes to bone.  Musculoskeletal: MMT 5/5 bilateral lower extremities in DF, PF, Inversion and Eversion. Deceased ROM in DF of ankle joint.  Neurological: Sensation intact to light touch.   ABIs:   Summary:  Right: Resting right ankle-brachial index indicates moderate right lower  extremity arterial disease. The  right toe-brachial index is abnormal.   Left: Resting left ankle-brachial index indicates mild left lower  extremity arterial disease. The left toe-brachial index is abnormal.   Assessment:   1. 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 PVD (peripheral vascular disease) ([place])   4. Type 2 diabetes mellitus with stage 3b chronic kidney disease, with long-term current use of insulin ([place])             Plan:  Patient was evaluated and treated and all questions answered. Ulcer right posterior ankle with muscle exposed, necorsis. Left fifth metatarsal wound with fat layer exposed and necrosis of bone.  X-rays reviewed and discussed with patient. No acute fractures or dislocations noted. Increased osseous destruction of fifth metatarsal noted from previous x-ray. Destruction noted to mid diaphysis  -No debridement on right  No debridement today as following with wound care this afternoon.  -Off-loading with surgical shoe on left. -Continue with wound care.Continue levofloxacin and follow-up with ID.  -Discussed with patient osteomyelitis and presence of bone infection in left fifth metatarsal. Discussed risk of limb loss and infection. Discussed options of surgery to remove bone vs bone biopsy and long term antibiotics. Patient would like to continue with antibiotics.  -Dr. Carlis Abbott preformed angiogram 8/3 with intervention to open up flow in right foot. Continuing to follow. Appears he will be  undergoing repeat angiogram on left and will be schedule for this soon.  -Patient will follow-up with wound care for chronic non-healing wound and possible need for hyperbarics.  -Discussed glucose control and proper protein-rich diet.  -Discussed if any worsening redness, pain, fever or chills to call or may need to report to the emergency room. Patient expressed understanding.  Discussed with location of wound and possible  infection and limited blood flow patient is at risk for limb loss.Expressed understanding.  -Will follow-up in about 5 weeks for routine foot care .       Return in about 5 weeks (around 01/05/2023) for rfc.     Return in about 5 weeks (around 01/05/2023) for rfc.   Lorenda Peck, DPM

## 2022-12-02 ENCOUNTER — Encounter: Payer: Self-pay | Admitting: Internal Medicine

## 2022-12-02 ENCOUNTER — Ambulatory Visit (INDEPENDENT_AMBULATORY_CARE_PROVIDER_SITE_OTHER): Payer: Medicare HMO | Admitting: Internal Medicine

## 2022-12-02 ENCOUNTER — Telehealth: Payer: Self-pay

## 2022-12-02 ENCOUNTER — Other Ambulatory Visit: Payer: Self-pay

## 2022-12-02 ENCOUNTER — Ambulatory Visit (INDEPENDENT_AMBULATORY_CARE_PROVIDER_SITE_OTHER): Payer: Medicare HMO

## 2022-12-02 VITALS — BP 136/89 | HR 125 | Temp 97.7°F | Ht 68.0 in | Wt 206.0 lb

## 2022-12-02 VITALS — BP 134/86 | HR 124 | Temp 97.5°F | Ht 68.0 in | Wt 208.8 lb

## 2022-12-02 DIAGNOSIS — I739 Peripheral vascular disease, unspecified: Secondary | ICD-10-CM | POA: Diagnosis not present

## 2022-12-02 DIAGNOSIS — S270XXD Traumatic pneumothorax, subsequent encounter: Secondary | ICD-10-CM | POA: Diagnosis not present

## 2022-12-02 DIAGNOSIS — R768 Other specified abnormal immunological findings in serum: Secondary | ICD-10-CM

## 2022-12-02 DIAGNOSIS — R7612 Nonspecific reaction to cell mediated immunity measurement of gamma interferon antigen response without active tuberculosis: Secondary | ICD-10-CM | POA: Diagnosis not present

## 2022-12-02 DIAGNOSIS — E1122 Type 2 diabetes mellitus with diabetic chronic kidney disease: Secondary | ICD-10-CM

## 2022-12-02 DIAGNOSIS — M86472 Chronic osteomyelitis with draining sinus, left ankle and foot: Secondary | ICD-10-CM | POA: Insufficient documentation

## 2022-12-02 DIAGNOSIS — Z5181 Encounter for therapeutic drug level monitoring: Secondary | ICD-10-CM | POA: Diagnosis not present

## 2022-12-02 DIAGNOSIS — N183 Chronic kidney disease, stage 3 unspecified: Secondary | ICD-10-CM

## 2022-12-02 DIAGNOSIS — Z794 Long term (current) use of insulin: Secondary | ICD-10-CM | POA: Diagnosis not present

## 2022-12-02 DIAGNOSIS — S2249XA Multiple fractures of ribs, unspecified side, initial encounter for closed fracture: Secondary | ICD-10-CM | POA: Diagnosis not present

## 2022-12-02 DIAGNOSIS — M47814 Spondylosis without myelopathy or radiculopathy, thoracic region: Secondary | ICD-10-CM | POA: Diagnosis not present

## 2022-12-02 MED ORDER — LEVOFLOXACIN 750 MG PO TABS: 750.0000 mg | ORAL_TABLET | Freq: Every day | ORAL | 0 refills | Status: AC

## 2022-12-02 NOTE — Assessment & Plan Note (Signed)
HCV Ab positive in Aug 2022 with detected VL but <15.  Will repeat today to ensure remained undetectable and no further treatment needed.

## 2022-12-02 NOTE — Progress Notes (Signed)
[name], male    DOB: [birth year]   MRN: [id number]   Brief patient profile:  58 yom active smoker  referred to pulmonary clinic 12/02/2022 by [name] re safety  of  hyperbaric 02  for wound care will need it daily rx x 6 weeks s/p R traumatic ptx  12/2021 with no blebs or significant emphysema at that time by CT    History of Present Illness  12/02/2022  Pulmonary/ 1st office eval/[facility]  Chief Complaint  Patient presents with   Consult    Needs exam to clear patient for hyperbaric chamber tx. May need final xray for previous punctured lung   Dyspnea:  not limited by breathing but by neuropathy/ uses crutches to get around  Cough: none  Sleep: none  SABA use: none   No obvious day to day or daytime pattern/variability or assoc excess/ purulent sputum or mucus plugs or hemoptysis or cp or chest tightness, subjective wheeze or overt sinus or hb symptoms.   Sleeping  without nocturnal  or early am exacerbation  of respiratory  c/o's or need for noct saba. Also denies any obvious fluctuation of symptoms with weather or environmental changes or other aggravating or alleviating factors except as outlined above   No unusual exposure hx or h/o childhood pna/ asthma or knowledge of premature birth.  Current Allergies, Complete Past Medical History, Past Surgical History, Family History, and Social History were reviewed in [employer] record.  ROS  The following are not active complaints unless bolded Hoarseness, sore throat, dysphagia, dental problems, itching, sneezing,  nasal congestion or discharge of excess mucus or purulent secretions, ear ache,   fever, chills, sweats, unintended wt loss or wt gain, classically pleuritic or exertional cp,  orthopnea pnd or arm/hand swelling  or leg swelling, presyncope, palpitations, abdominal pain, anorexia, nausea, vomiting, diarrhea  or change in bowel habits or change in bladder habits, change in stools or change in  urine, dysuria, hematuria,  rash, arthralgias, visual complaints, headache, numbness, weakness or ataxia or problems with walking or coordination,  change in mood or  memory.           Past Medical History:  Diagnosis Date   A-fib [facility])    a. (06/04/14) TEE-DC-CV; succesful; large LA 6.2 cm   ADHD    Ascites    Athlete's foot    Chronic systolic heart failure ([place])    a. 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, unspecified CKD stage, unspecified whether long term insulin use (HCC)    Heart murmur    HTN (hypertension)    OSA (obstructive sleep apnea)    PVD (peripheral vascular disease) (HCC)    s/p great toe amputation    Outpatient Medications Prior to Visit  Medication Sig Dispense Refill   APPLE CIDER VINEGAR PO Take 30 mLs by mouth 4 (four) times a week.     blood glucose meter kit and supplies Dispense based on patient and insurance preference. Use up to four times daily as directed. (FOR ICD-10 E10.9, E11.9). 1 each 0   clopidogrel (PLAVIX) 75 MG tablet Take 1 tablet (75 mg total) by mouth daily. 30 tablet 11   diclofenac Sodium (VOLTAREN) 1 % GEL Apply 1 Application topically 4 (four) times  daily as needed (pain).     doxycycline (VIBRA-TABS) 100 MG tablet Take 100 mg by mouth 2 (two) times daily.     empagliflozin (JARDIANCE) 10 MG TABS tablet TAKE 1 TABLET(10 MG) BY MOUTH DAILY 30 tablet 5   ENTRESTO 49-51 MG TAKE 1 TABLET BY MOUTH TWICE DAILY 180 tablet 2   furosemide (LASIX) 20 MG tablet TAKE 1 TABLET(20 MG) BY MOUTH TWICE DAILY 60 tablet 0   insulin glargine (LANTUS) 100 UNIT/ML injection INJECT 8 UNITS UNDER THE SKIN DAILY 10 mL 11   Insulin Syringe-Needle U-100 (INSULIN SYRINGE 1CC/30GX5/16") 30G X 5/16" 1 ML MISC Use once daily for insulin injections. Dx:E11.22 100  each 1   levofloxacin (LEVAQUIN) 750 MG tablet Take 750 mg by mouth daily.     metoprolol succinate (TOPROL XL) 25 MG 24 hr tablet Take 1 tablet (25 mg total) by mouth daily. 30 tablet 11   Misc Natural Products (JOINT SUPPORT PO) Take 1 Dose by mouth 3 (three) times a week.     spironolactone (ALDACTONE) 25 MG tablet Take 0.5 tablets (12.5 mg total) by mouth daily. 45 tablet 3   warfarin (COUMADIN) 6 MG tablet Take 0.5-1 tablets (3-6 mg total) by mouth See admin instructions. Take 6 mg on Mon and Fri Take 3 mg on Sun, Tues, Wed, Thurs, and Sat 30 tablet 6   amoxicillin-clavulanate (AUGMENTIN) 875-125 MG tablet Take 1 tablet by mouth 2 (two) times daily. (Patient not taking: Reported on 12/02/2022)     methocarbamol (ROBAXIN) 500 MG tablet Take 1 tablet (500 mg total) by mouth every 8 (eight) hours as needed for muscle spasms. (Patient not taking: Reported on 12/02/2022) 50 tablet 1   traMADol (ULTRAM) 50 MG tablet Take 1 tablet (50 mg total) by mouth every 6 (six) hours as needed for moderate pain. (Patient not taking: Reported on 12/02/2022) 20 tablet 0   No facility-administered medications prior to visit.     Objective:     BP 134/86 (BP Location: Left Arm, Patient Position: Sitting, Cuff Size: Large)   Pulse (!) 124   Temp (!) 97.5 F (36.4 C) (Oral)   Ht 5\' 8"  (1.727 m)   Wt 208 lb 12.8 oz (94.7 kg)   SpO2 98%   BMI 31.75 kg/m   SpO2: 98 %  Amb wm walking with crutches/ pleasantly verbose/ very circumferential answering questions     HEENT : Oropharynx  clear      NECK :  without  apparent JVD/ palpable Nodes/TM    LUNGS: no acc muscle use,  Nl contour chest which is clear to A and P bilaterally without cough on insp or exp maneuvers   CV:  RRR  no s3 or murmur or increase in P2, and no edema   ABD: mildly obese  soft and nontender with nl inspiratory excursion in the supine position. No bruits or organomegaly appreciated   MS:  walks with crutches ext warm without  deformities Or obvious joint restrictions  calf tenderness, cyanosis or clubbing    SKIN: warm and dry without lesions    NEURO:  alert, approp, nl sensorium with  no motor or cerebellar deficits apparent.   CXR PA and Lateral:   12/02/2022 :    I personally reviewed images and impression is as follows:     Nl cxr - no pleural or parenchymal scars prior prior PTX on R     Assessment   Pneumothorax, closed, traumatic, subsequent encounter Onset p fell on  R chest 12/11/21 s/p R CT x 2 days only  - cxr 12/02/2022 wnl   Despite smoking hx he does not appear to have significant bullous changes on cxr nor significant copd at this point so no specific contraindication in this traumatic PTX hx setting to offering hyperbaric 02 if there is good likelihood of benefit.   Advised though for any cp,sob, cough during hyperbaric rx should immediately stop and assess for cause with pulmonary f/u prn         Each maintenance medication was reviewed in detail including emphasizing most importantly the difference between maintenance and prns and under what circumstances the prns are to be triggered using an action plan format where appropriate.  Total time for H and P, chart review, counseling, and generating customized AVS unique to this office visit / same day charting = 45 min with complex pt new to me          Sandrea Hughs, MD 12/02/2022

## 2022-12-02 NOTE — Progress Notes (Signed)
EBERARDO, DEMELLO A ([id number]) [id number] Page 1 of 10 Visit Report for 12/01/2022 Arrival Information Details Patient Name: Date of Service: [name] [facility] A. 12/01/2022 2:00 PM Medical Record Number: [id number] Patient Account Number: [id number] Date of Birth/Sex: Treating RN: 03-28-1951 (72 y.o. [name] Primary Care Carolyn Maniscalco: Richarda Blade Other Clinician: Referring Analysia Dungee: Treating Paytan Recine/Extender: Geralyn Corwin Ngetich, [name] in Treatment: 19 Visit Information History Since Last Visit Added or deleted any medications: No Patient Arrived: Crutches Any new allergies or adverse reactions: No Arrival Time: 14:03 Had a fall or experienced change in No Accompanied By: self activities of daily living that may affect Transfer Assistance: None risk of falls: Patient Identification Verified: Yes Signs or symptoms of abuse/neglect since last visito No Secondary Verification Process Completed: Yes Hospitalized since last visit: No Patient Requires Transmission-Based Precautions: No Implantable device outside of the clinic excluding No Patient Has Alerts: Yes cellular tissue based products placed in the center Patient Alerts: Patient on Blood Thinner since last visit: Has Dressing in Place as Prescribed: Yes Pain Present Now: No Electronic Signature(s) Signed: 12/02/2022 4:09:44 PM By: Fonnie Mu RN Entered By: Fonnie Mu on 12/01/2022 14:03:37 -------------------------------------------------------------------------------- Clinic Level of Care Assessment Details Patient Name: Date of Service: [name] [facility] A. 12/01/2022 2:00 PM Medical Record Number: [id number] Patient Account Number: [id number] Date of Birth/Sex: Treating RN: 1951/04/11 (72 y.o. [name] Primary Care Khani Paino: Richarda Blade Other Clinician: Referring Sergei Delo: Treating Reika Callanan/Extender: Geralyn Corwin Ngetich, [name]  in Treatment: 19 Clinic Level of Care Assessment Items TOOL 4 Quantity Score X- 1 0 Use when only an EandM is performed on FOLLOW-UP visit ASSESSMENTS - Nursing Assessment / Reassessment X- 1 10 Reassessment of Co-morbidities (includes updates in patient status) X- 1 5 Reassessment of Adherence to Treatment Plan ASSESSMENTS - Wound and Skin A ssessment / Reassessment X - Simple Wound Assessment / Reassessment - one wound 1 5 []  - 0 Complex Wound Assessment / Reassessment - multiple wounds []  - 0 Dermatologic / Skin Assessment (not related to wound area) ASSESSMENTS - Focused Assessment X- 1 5 Circumferential Edema Measurements - multi extremities []  - 0 Nutritional Assessment / Counseling / Intervention FINNEAN, CERAMI A ( ) [id number] Page 2 of 10 []  - 0 Lower Extremity Assessment (monofilament, tuning fork, pulses) []  - 0 Peripheral Arterial Disease Assessment (using hand held doppler) ASSESSMENTS - Ostomy and/or Continence Assessment and Care []  - 0 Incontinence Assessment and Management []  - 0 Ostomy Care Assessment and Management (repouching, etc.) PROCESS - Coordination of Care []  - 0 Simple Patient / Family Education for ongoing care X- 1 20 Complex (extensive) Patient / Family Education for ongoing care X- 1 10 Staff obtains , Records, T Results / Process Orders est []  - 0 Staff telephones HHA, Nursing Homes / Clarify orders / etc []  - 0 Routine Transfer to another Facility (non-emergent condition) []  - 0 Routine Hospital Admission (non-emergent condition) []  - 0 New Admissions / [name] / Ordering NPWT Apligraf, etc. , []  - 0 Emergency Hospital Admission (emergent condition) []  - 0 Simple Discharge Coordination X- 1 15 Complex (extensive) Discharge Coordination PROCESS - Special Needs []  - 0 Pediatric / Minor Patient Management []  - 0 Isolation Patient Management []  - 0 Hearing / Language  / Visual special needs []  - 0 Assessment of Community assistance (transportation, D/C planning, etc.) []  - 0 Additional assistance / Altered mentation []  - 0 Support Surface(s) Assessment (bed, cushion, seat, etc.) INTERVENTIONS - Wound  Cleansing / Measurement X - Simple Wound Cleansing - one wound 1 5 []  - 0 Complex Wound Cleansing - multiple wounds X- 1 5 Wound Imaging (photographs - any number of wounds) []  - 0 Wound Tracing (instead of photographs) X- 1 5 Simple Wound Measurement - one wound []  - 0 Complex Wound Measurement - multiple wounds INTERVENTIONS - Wound Dressings []  - 0 Small Wound Dressing one or multiple wounds X- 1 15 Medium Wound Dressing one or multiple wounds []  - 0 Large Wound Dressing one or multiple wounds X- 1 5 Application of Medications - topical []  - 0 Application of Medications - injection INTERVENTIONS - Miscellaneous []  - 0 External ear exam []  - 0 Specimen Collection (cultures, biopsies, blood, body fluids, etc.) []  - 0 Specimen(s) / Culture(s) sent or taken to Lab for analysis []  - 0 Patient Transfer (multiple staff / Civil Service fast streamer / Similar devices) []  - 0 Simple Staple / Suture removal (25 or less) []  - 0 Complex Staple / Suture removal (26 or more) []  - 0 Hypo / Hyperglycemic Management (close monitor of Blood Glucose) LOYD, MARHEFKA A ([id number]) [phone].pdf Page 3 of 10 []  - 0 Ankle / Brachial Index (ABI) - do not check if billed separately X- 1 5 Vital Signs Has the patient been seen at the hospital within the last three years: Yes Total Score: 110 Level Of Care: New/Established - Level 3 Electronic Signature(s) Signed: 12/02/2022 4:09:44 PM By: Rhae Hammock RN Entered By: Rhae Hammock on 12/01/2022 14:36:04 -------------------------------------------------------------------------------- Encounter Discharge Information Details Patient Name: Date of Service: [name]  12/01/2022 2:00 PM Medical Record Number: [id number] Patient Account Number: [id number] Date of Birth/Sex: Treating RN: 09-09-1951 (72 y.o. [name] Primary Care Myrel Rappleye: [name] Other Clinician: Referring Elmer Merwin: Treating Ilai Hiller/Extender: [name] Ngetich, [name] in Treatment: 38 Encounter Discharge Information Items Post Procedure Vitals Discharge Condition: Stable Temperature (F): 98.7 Ambulatory Status: Crutches Pulse (bpm): 74 Discharge Destination: Home Respiratory Rate (breaths/min): 17 Transportation: Private Auto Blood Pressure (mmHg): 120/80 Accompanied By: daughter and granddaughter Schedule Follow-up Appointment: Yes Clinical Summary of Care: Patient Declined Electronic Signature(s) Signed: 12/02/2022 4:09:44 PM By: Rhae Hammock RN Entered By: Rhae Hammock on 12/01/2022 14:51:57 -------------------------------------------------------------------------------- Lower Extremity Assessment Details Patient Name: Date of Service: [name] 12/01/2022 2:00 PM Medical Record Number: [id number] Patient Account Number: [id number] Date of Birth/Sex: Treating RN: Sep 04, 1951 (72 y.o. [name] Primary Care Cala Kruckenberg: [name] Other Clinician: Referring Montrice Gracey: Treating Curtis Cain/Extender: [name] Ngetich, [name] in Treatment: 19 Edema Assessment Assessed: [Left: No] [Right: No] Edema: [Left: No] [Right: No] Calf Left: Right: Point of Measurement: 39 cm From Medial Instep 37 cm 35 cm Ankle Left: Right: Point of Measurement: 8 cm From Medial Instep 25 cm 23 cm Electronic Signature(s) Signed: 12/02/2022 4:09:44 PM By: Rhae Hammock RN Cordie Grice ([id number]) By: Rhae Hammock RN [id number].pdf Page 4 of 10 Signed: 12/02/2022 4:09:44 PM Entered By: Rhae Hammock on 12/01/2022  14:08:49 -------------------------------------------------------------------------------- Multi Wound Chart Details Patient Name: Date of Service: [name] [facility] A. 12/01/2022 2:00 PM Medical Record Number: [id number] Patient Account Number: [id number] Date of Birth/Sex: Treating RN: Oct 28, 1951 (72 y.o. M) Primary Care Raegyn Renda: [name] Other Clinician: Referring Isayah Ignasiak: Treating Jaquavius Hudler/Extender: [name] Ngetich, [name] in Treatment: 19 Vital Signs Height(in): 68 Pulse(bpm): 71 Weight(lbs): 185 Blood Pressure(mmHg): 181/91 Body Mass Index(BMI): 28.1 Temperature(F): 98 Respiratory Rate(breaths/min): 17 [1:Photos:] [N/A:N/A] Right Achilles Left, Plantar Foot N/A Wound  Location: Surgical Injury Gradually Appeared N/A Wounding Event: Diabetic Wound/Ulcer of the Lower Diabetic Wound/Ulcer of the Lower N/A Primary Etiology: Extremity Extremity Sleep Apnea, Arrhythmia, Congestive Sleep Apnea, Arrhythmia, Congestive N/A Comorbid History: Heart Failure, Hypertension, Peripheral Heart Failure, Hypertension, Peripheral Arterial Disease, Type II Diabetes Arterial Disease, Type II Diabetes 07/16/2022 03/09/2022 N/A Date Acquired: 19 19 N/A Weeks of Treatment: Open Open N/A Wound Status: No No N/A Wound Recurrence: 3.5x0.5x0.1 0.4x0.7x1.8 N/A Measurements L x W x D (cm) 1.374 0.22 N/A A (cm) : rea 0.137 0.396 N/A Volume (cm) : 93.30% 0.00% N/A % Reduction in A rea: 98.70% -500.00% N/A % Reduction in Volume: Grade 2 Grade 3 N/A Classification: None Present Medium N/A Exudate A mount: N/A Serosanguineous N/A Exudate Type: N/A red, brown N/A Exudate Color: Distinct, outline attached Distinct, outline attached N/A Wound Margin: None Present (0%) Medium (34-66%) N/A Granulation A mount: N/A Pink, Friable N/A Granulation Quality: Small (1-33%) Medium (34-66%) N/A Necrotic A mount: Eschar Adherent Slough N/A Necrotic Tissue: Fat Layer  (Subcutaneous Tissue): Yes Fat Layer (Subcutaneous Tissue): Yes N/A Exposed Structures: Fascia: No Bone: Yes Tendon: No Fascia: No Muscle: No Tendon: No Joint: No Muscle: No Bone: No Joint: No Large (67-100%) Small (1-33%) N/A Epithelialization: Excoriation: No Callus: Yes N/A Periwound Skin Texture: Induration: No Excoriation: No Callus: No Induration: No Crepitus: No Crepitus: No Rash: No Rash: No Scarring: No Scarring: No Dry/Scaly: Yes Dry/Scaly: Yes N/A Periwound Skin Moisture: Maceration: No Maceration: No Atrophie Blanche: No Atrophie Blanche: No N/A Periwound Skin Color: Cyanosis: No Cyanosis: No Ecchymosis: No Ecchymosis: No HARBERT, FITTERER A ([id number]) [id number] Page 5 of 10 Erythema: No Erythema: No Hemosiderin Staining: No Hemosiderin Staining: No Mottled: No Mottled: No Pallor: No Pallor: No Rubor: No Rubor: No Treatment Notes Electronic Signature(s) Signed: 12/01/2022 4:21:39 PM By: [name] DO Entered By: [name] on 12/01/2022 14:21:01 -------------------------------------------------------------------------------- Multi-Disciplinary Care Plan Details Patient Name: Date of Service: [name] [name] [facility] A. 12/01/2022 2:00 PM Medical Record Number: [id number] Patient Account Number: [id number] Date of Birth/Sex: Treating RN: [birth year] (72 y.o. [name] Primary Care Disaya Walt: [name] Other Clinician: Referring Willian Donson: Treating Janaysia Mcleroy/Extender: [name] Ngetich, [name] in Treatment: 90 Active Inactive Nutrition Nursing Diagnoses: Potential for alteratiion in Nutrition/Potential for imbalanced nutrition Goals: Patient/caregiver agrees to and verbalizes understanding of need to use nutritional supplements and/or vitamins as prescribed Date Initiated: 07/16/2022 Target Resolution Date: 01/08/2023 Goal Status: Active Patient/caregiver will maintain therapeutic  glucose control Date Initiated: 07/16/2022 Target Resolution Date: 01/08/2023 Goal Status: Active Interventions: Assess HgA1c results as ordered upon admission and as needed Provide education on nutrition Treatment Activities: Education provided on Nutrition : 10/08/2022 Obtain HgA1c : 07/16/2022 Patient referred to Primary Care Physician for further nutritional evaluation : 07/16/2022 Notes: Pain, Acute or Chronic Nursing Diagnoses: Pain, acute or chronic: actual or potential Potential alteration in comfort, pain Goals: Patient will verbalize adequate pain control and receive pain control interventions during procedures as needed Date Initiated: 07/16/2022 Target Resolution Date: 01/08/2023 Goal Status: Active Interventions: Encourage patient to take pain medications as prescribed Provide education on pain management Reposition patient for comfort Treatment Activities: Administer pain control measures as ordered : 07/16/2022 [name] ([id number]) [phone].pdf Page 6 of 10 Notes: Electronic Signature(s) Signed: 12/02/2022 4:09:44 PM By: Rhae Hammock RN Entered By: Rhae Hammock on 12/01/2022 14:18:54 -------------------------------------------------------------------------------- Pain Assessment Details Patient Name: Date of Service: [name] [facility] A. 12/01/2022 2:00 PM Medical Record Number: [id number] Patient Account Number: [id number]  Date of Birth/Sex: Treating RN: 19-Mar-1951 (72 y.o. [name] Primary Care Aleana Fifita: Richarda Blade Other Clinician: Referring Fayrene Towner: Treating Consepcion Utt/Extender: Geralyn Corwin Ngetich, [name] in Treatment: 70 Active Problems Location of Pain Severity and Description of Pain Patient Has Paino No Site Locations Pain Management and Medication Current Pain Management: Electronic Signature(s) Signed: 12/02/2022 4:09:44 PM By: Fonnie Mu RN Entered By: Fonnie Mu on  12/01/2022 14:03:46 -------------------------------------------------------------------------------- Patient/Caregiver Education Details Patient Name: Date of Service: Criss Alvine A. 1/23/2024andnbsp2:00 PM Medical Record Number: [id number] Patient Account Number: [id number] Date of Birth/Gender: Treating RN: [birth year] (72 y.o. [name] Primary Care Physician: Richarda Blade Other Clinician: Referring Physician: Treating Physician/Extender: Jae Dire in Treatment: [address] RAYWOOD, WAILES A ([id number]) [id number] Page 7 of 10 Education Provided To: Patient and Caregiver Education Topics Provided Wound/Skin Impairment: Methods: Explain/Verbal Responses: Reinforcements needed, State content correctly Electronic Signature(s) Signed: 12/02/2022 4:09:44 PM By: Fonnie Mu RN Entered By: Fonnie Mu on 12/01/2022 14:19:06 -------------------------------------------------------------------------------- Wound Assessment Details Patient Name: Date of Service: [name] [facility] A. 12/01/2022 2:00 PM Medical Record Number: [id number] Patient Account Number: [id number] Date of Birth/Sex: Treating RN: 01/24/1951 (72 y.o. [name], Lauren Primary Care Diyana Starrett: Richarda Blade Other Clinician: Referring Shikita Vaillancourt: Treating Joaopedro Eschbach/Extender: Geralyn Corwin Ngetich, [name] in Treatment: 19 Wound Status Wound Number: 1 Primary Diabetic Wound/Ulcer of the Lower Extremity Etiology: Wound Location: Right Achilles Wound Open Wounding Event: Surgical Injury Status: Date Acquired: 07/16/2022 Comorbid Sleep Apnea, Arrhythmia, Congestive Heart Failure, Hypertension, Weeks Of Treatment: 19 History: Peripheral Arterial Disease, Type II Diabetes Clustered Wound: No Photos Wound Measurements Length: (cm) 3.5 Width: (cm) 0.5 Depth: (cm) 0.1 Area: (cm) 1.374 Volume: (cm) 0.137 %  Reduction in Area: 93.3% % Reduction in Volume: 98.7% Epithelialization: Large (67-100%) Tunneling: No Undermining: No Wound Description Classification: Grade 2 Wound Margin: Distinct, outline attached Exudate Amount: None Present Foul Odor After Cleansing: No Slough/Fibrino No Wound Bed Granulation Amount: None Present (0%) Exposed Structure Necrotic Amount: Small (1-33%) Fascia Exposed: No Necrotic Quality: Eschar Fat Layer (Subcutaneous Tissue) Exposed: Yes Tendon Exposed: No Muscle Exposed: No Joint Exposed: No Bone Exposed: No ALPHONSE, ASBRIDGE A ([id number]) [phone] Page 8 of 10 Periwound Skin Texture Texture Color No Abnormalities Noted: No No Abnormalities Noted: No Callus: No Atrophie Blanche: No Crepitus: No Cyanosis: No Excoriation: No Ecchymosis: No Induration: No Erythema: No Rash: No Hemosiderin Staining: No Scarring: No Mottled: No Pallor: No Moisture Rubor: No No Abnormalities Noted: No Dry / Scaly: Yes Maceration: No Treatment Notes Wound #1 (Achilles) Wound Laterality: Right Cleanser Soap and Water Discharge Instruction: May shower and wash wound with dial antibacterial soap and water prior to dressing change. Wound Cleanser Discharge Instruction: Cleanse the wound with wound cleanser prior to applying a clean dressing using gauze sponges, not tissue or cotton balls. Peri-Wound Care Sween Lotion (Moisturizing lotion) Discharge Instruction: Apply moisturizing lotion as directed Topical Primary Dressing Grafix #7 Discharge Instruction: applied by Shiara Mcgough. adpatic and steri-strips Discharge Instruction: to secure grafix in place. do not remove. Secondary Dressing ABD Pad, 5x9 Discharge Instruction: Apply over primary dressing as directed. Woven Gauze Sponge, Non-Sterile 4x4 in Discharge Instruction: Apply over primary dressing as directed. Secured With Compression Wrap Kerlix Roll 4.5x3.1 (in/yd) Discharge  Instruction: Apply Kerlix and Coban compression as directed. Coban Self-Adherent Wrap 4x5 (in/yd) Discharge Instruction: Apply over Kerlix as directed. Compression Stockings Add-Ons Electronic Signature(s) Signed: 12/02/2022 4:09:44 PM By: Fonnie Mu RN Entered By: Fonnie Mu on 12/01/2022  14:15:20 -------------------------------------------------------------------------------- Wound Assessment Details Patient Name: Date of Service: [name] [facility] A. 12/01/2022 2:00 PM Medical Record Number: [id number] Patient Account Number: [id number] Date of Birth/Sex: Treating RN: Jul 22, 1951 (72 y.o. [name] Primary Care Modesta Sammons: Richarda Blade Other Clinician: Referring Luccas Towell: Treating Cresencia Asmus/Extender: Velna Ochs, [name] in Treatment: [address] [place] A ([id number]) [id number] Page 9 of 10 Wound Status Wound Number: 2 Primary Diabetic Wound/Ulcer of the Lower Extremity Etiology: Wound Location: Left, Plantar Foot Wound Open Wounding Event: Gradually Appeared Status: Date Acquired: 03/09/2022 Comorbid Sleep Apnea, Arrhythmia, Congestive Heart Failure, Hypertension, Weeks Of Treatment: 19 History: Peripheral Arterial Disease, Type II Diabetes Clustered Wound: No Photos Wound Measurements Length: (cm) 0.4 Width: (cm) 0.7 Depth: (cm) 1.8 Area: (cm) 0.22 Volume: (cm) 0.396 % Reduction in Area: 0% % Reduction in Volume: -500% Epithelialization: Small (1-33%) Wound Description Classification: Grade 3 Wound Margin: Distinct, outline attached Exudate Amount: Medium Exudate Type: Serosanguineous Exudate Color: red, brown Foul Odor After Cleansing: No Slough/Fibrino No Wound Bed Granulation Amount: Medium (34-66%) Exposed Structure Granulation Quality: Pink, Friable Fascia Exposed: No Necrotic Amount: Medium (34-66%) Fat Layer (Subcutaneous Tissue) Exposed: Yes Necrotic Quality: Adherent  Slough Tendon Exposed: No Muscle Exposed: No Joint Exposed: No Bone Exposed: Yes Periwound Skin Texture Texture Color No Abnormalities Noted: No No Abnormalities Noted: No Callus: Yes Atrophie Blanche: No Crepitus: No Cyanosis: No Excoriation: No Ecchymosis: No Induration: No Erythema: No Rash: No Hemosiderin Staining: No Scarring: No Mottled: No Pallor: No Moisture Rubor: No No Abnormalities Noted: No Dry / Scaly: Yes Maceration: No Treatment Notes Wound #2 (Foot) Wound Laterality: Plantar, Left Cleanser Wound Cleanser Discharge Instruction: Cleanse the wound with wound cleanser prior to applying a clean dressing using gauze sponges, not tissue or cotton balls. Peri-Wound Care Skin Prep Discharge Instruction: Use skin prep as directed Topical ETHON, WYMER A ([id number]) [id number] Page 10 of 10 Primary Dressing Dakin's Solution 0.25%, 16 (oz) Discharge Instruction: Moisten gauze with Dakin's solution Secondary Dressing Optifoam Non-Adhesive Dressing, 4x4 in Discharge Instruction: Apply over primary dressing as directed. Woven Gauze Sponge, Non-Sterile 4x4 in Discharge Instruction: Apply over primary dressing as directed. Woven Gauze Sponges 2x2 in Discharge Instruction: Apply over primary dressing as directed. Secured With Conforming Stretch Gauze Bandage, Sterile 2x75 (in/in) Discharge Instruction: Secure with stretch gauze as directed. 19M Medipore H Soft Cloth Surgical T ape, 4 x 10 (in/yd) Discharge Instruction: Secure with tape as directed. Compression Wrap Compression Stockings Add-Ons Electronic Signature(s) Signed: 12/02/2022 4:09:44 PM By: Fonnie Mu RN Entered By: Fonnie Mu on 12/01/2022 14:16:15 -------------------------------------------------------------------------------- Vitals Details Patient Name: Date of Service: [name] [name] 12/01/2022 2:00 PM Medical Record Number: [id number] Patient  Account Number: [id number] Date of Birth/Sex: Treating RN: 10-12-1951 (72 y.o. [name], Lauren Primary Care Sheliah Fiorillo: Richarda Blade Other Clinician: Referring Julie Paolini: Treating Haylo Fake/Extender: Geralyn Corwin Ngetich, [name] in Treatment: 19 Vital Signs Time Taken: 14:17 Temperature (F): 98 Height (in): 68 Pulse (bpm): 71 Weight (lbs): 185 Respiratory Rate (breaths/min): 17 Body Mass Index (BMI): 28.1 Blood Pressure (mmHg): 181/91 Reference Range: 80 - 120 mg / dl Electronic Signature(s) Signed: 12/02/2022 4:09:44 PM By: Fonnie Mu RN Entered By: Fonnie Mu on 12/01/2022 14:17:45

## 2022-12-02 NOTE — Telephone Encounter (Addendum)
Patient schedule with [facility] IR to have picc line placed on 12/14/22 @ 9am. Patient and patient's daughter instructed where to go for appointment and to arrive at 8:45 am.  I have also sent a community message to [employer] with patient's picc line appointment.  Patient will not need first dose of cefepime, patient has been on this in the past.  Georgeana Oertel T Brooks Sailors

## 2022-12-02 NOTE — Progress Notes (Signed)
[facility]  Reason for Consult: Left foot osteomyelitis  Referring Provider: Dr Blenda Mounts   HPI:    [name] is a 72 y.o. male with PMHx as below who presents to the clinic for osteomyelitis of the left foot.   Patient has a complex history including diabetes, chronic kidney disease, CHF, hypertension, and peripheral vascular disease.  He has had previous vascular surgery evaluation and status post angiogram in August 2023 with right PT angioplasty requiring retrograde pedal access for critical limb ischemia with tissue loss.  This wound is currently being followed closely by Dr. Heber [place] at the wound clinic and has made significant progress per previous notes.  Patient has also had issues with a wound on his left lateral foot.  He previously underwent left peroneal angioplasty in July 2022 for left fifth toe gangrene requiring amputation.  This area has recently been complicated by the development of an ulcer which is complicated by osteomyelitis based on recent plain films as well as exam noting bone exposure.  He had a wound culture on 12/26 that is labeled "deep tissue" that grew Pseudomonas aeruginosa and Serratia marcescens.  He also had had a culture on 11/10/2022 labeled "wound (site not specified)" that grew Pseudomonas aeruginosa and group B Streptococcus.  Per the notes, this was a bone biopsy taken in the podiatry clinic.  They discussed risk of limb loss and infection as well as surgery to remove infected bone versus long-term antibiotics.  Patient opted for the latter.  He also saw vascular surgery last week whom is planning for repeat angiogram with focus on the left leg given these more recent developments.  Patient also has a history of chronic kidney disease.  His most recent creatinine is from August 2023 at which point it was 1.91 with a creatinine clearance of approximately 47.  It appears his baseline is closer to 1.5-1.7. He is on  Levaquin currently and prior to that was on Cipro.  He has been on a FQ since about 11/10/22.  They report the wound is improving and he has less pain.  He has no fevers or systemic symptoms.   Patient's Medications  New Prescriptions   No medications on file  Previous Medications   APPLE CIDER VINEGAR PO    Take 30 mLs by mouth 4 (four) times a week.   BLOOD GLUCOSE METER KIT AND SUPPLIES    Dispense based on patient and insurance preference. Use up to four times daily as directed. (FOR ICD-10 E10.9, E11.9).   CLOPIDOGREL (PLAVIX) 75 MG TABLET    Take 1 tablet (75 mg total) by mouth daily.   DICLOFENAC SODIUM (VOLTAREN) 1 % GEL    Apply 1 Application topically 4 (four) times daily as needed (pain).   EMPAGLIFLOZIN (JARDIANCE) 10 MG TABS TABLET    TAKE 1 TABLET(10 MG) BY MOUTH DAILY   ENTRESTO 49-51 MG    TAKE 1 TABLET BY MOUTH TWICE DAILY   FUROSEMIDE (LASIX) 20 MG TABLET    TAKE 1 TABLET(20 MG) BY MOUTH TWICE DAILY   INSULIN GLARGINE (LANTUS) 100 UNIT/ML INJECTION    INJECT 8 UNITS UNDER THE SKIN DAILY   INSULIN SYRINGE-NEEDLE U-100 (INSULIN SYRINGE 1CC/30GX5/16") 30G X 5/16" 1 ML MISC    Use once daily for insulin injections. Dx:E11.22   METHOCARBAMOL (ROBAXIN) 500 MG TABLET    Take 1 tablet (500 mg total) by mouth every 8 (eight) hours as needed for muscle spasms.  METOPROLOL SUCCINATE (TOPROL XL) 25 MG 24 HR TABLET    Take 1 tablet (25 mg total) by mouth daily.   MISC NATURAL PRODUCTS (JOINT SUPPORT PO)    Take 1 Dose by mouth 3 (three) times a week.   SPIRONOLACTONE (ALDACTONE) 25 MG TABLET    Take 0.5 tablets (12.5 mg total) by mouth daily.   TRAMADOL (ULTRAM) 50 MG TABLET    Take 1 tablet (50 mg total) by mouth every 6 (six) hours as needed for moderate pain.   WARFARIN (COUMADIN) 6 MG TABLET    Take 0.5-1 tablets (3-6 mg total) by mouth See admin instructions. Take 6 mg on Mon and Fri Take 3 mg on Sun, Tues, Wed, Thurs, and Sat  Modified Medications   Modified Medication Previous  Medication   LEVOFLOXACIN (LEVAQUIN) 750 MG TABLET levofloxacin (LEVAQUIN) 750 MG tablet      Take 1 tablet (750 mg total) by mouth daily for 7 days.    Take 750 mg by mouth daily.  Discontinued Medications   AMOXICILLIN-CLAVULANATE (AUGMENTIN) 875-125 MG TABLET    Take 1 tablet by mouth 2 (two) times daily.   DOXYCYCLINE (VIBRA-TABS) 100 MG TABLET    Take 100 mg by mouth 2 (two) times daily.      Past Medical History:  Diagnosis Date   A-fib [facility])    a. (06/04/14) TEE-DC-CV; succesful; large LA 6.2 cm   ADHD    Ascites    Athlete's foot    Chronic systolic heart failure ([place])    a. 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, unspecified CKD stage, unspecified whether long term insulin use (HCC)    Heart murmur    HTN (hypertension)    OSA (obstructive sleep apnea)    PVD (peripheral vascular disease) (HCC)    s/p great toe amputation    Social History   Tobacco Use   Smoking status: Some Days    Years: 52.00    Types: Cigarettes, Cigars    Last attempt to quit: 05/2021    Years since quitting: 1.5    Passive exposure: Never   Smokeless tobacco: Never  Vaping Use   Vaping Use: Never used  Substance Use Topics   Alcohol use: Not Currently   Drug use: Never    Family History  Problem Relation Age of Onset   Heart attack Mother        deceased   Diabetes Mother    Heart attack Sister     No Known Allergies  Review of Systems  All other systems reviewed and are negative.     OBJECTIVE:    Vitals:   12/02/22 1348  BP: 136/89  Pulse: (!) 125  Temp: 97.7 F (36.5 C)  TempSrc: Temporal  Weight: 206 lb (93.4 kg)  Height: 5\' 8"  (1.727 m)     Body mass index is 31.32 kg/m.  Physical Exam Constitutional:      Appearance: Normal appearance.  Eyes:      Extraocular Movements: Extraocular movements intact.     Conjunctiva/sclera: Conjunctivae normal.  Abdominal:     General: There is no distension.     Palpations: Abdomen is soft.  Musculoskeletal:     Cervical back: Normal range of motion and neck supple.  Comments: Both feet are wrapped and in a Darco boot.  They were not taken down at patient request.  Skin:    General: Skin is warm and dry.  Neurological:     General: No focal deficit present.     Mental Status: He is alert and oriented to person, place, and time.  Psychiatric:        Mood and Affect: Mood normal.        Behavior: Behavior normal.      Labs and Microbiology:     Latest Ref Rng & Units 06/18/2022   10:29 AM 06/11/2022   10:00 AM 06/11/2022    9:40 AM  CBC  WBC 3.8 - 10.8 Thousand/uL 9.3     Hemoglobin 13.2 - 17.1 g/dL 06.2  69.4  85.4   Hematocrit 38.5 - 50.0 % 39.7  38.0  40.0   Platelets 140 - 400 Thousand/uL 222         Latest Ref Rng & Units 06/18/2022   10:29 AM 06/11/2022   10:00 AM 06/11/2022    9:40 AM  CMP  Glucose 65 - 99 mg/dL 627  035  009   BUN 7 - 25 mg/dL 32  30  50   Creatinine 0.70 - 1.28 mg/dL 3.81  8.29  9.37   Sodium 135 - 146 mmol/L 140  140  138   Potassium 3.5 - 5.3 mmol/L 4.5  4.6  6.0   Chloride 98 - 110 mmol/L 105  105  105   CO2 20 - 32 mmol/L 24     Calcium 8.6 - 10.3 mg/dL 9.0     Total Protein 6.1 - 8.1 g/dL 7.6     Total Bilirubin 0.2 - 1.2 mg/dL 0.5     AST 10 - 35 U/L 17     ALT 9 - 46 U/L 22         ASSESSMENT & PLAN:    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  He wants to exhaust his efforts at limb salvage.  We discussed his culture results and plan for IV antibiotics.  Will arrange for PICC line placement and check baseline lab work today.  Based on his historical creatinine clearance, will plan  for cefepime 2 g every 12 hours but this may need to be adjusted based on the results today.  He is currently on levofloxacin from most recent cultures and will continue this through his PICC line placement on 12/14/22.  He actually will have received almost 4-5 weeks of antibiotics by the time PICC line placed.  We discussed continuing PO therapy alone but they would like to be aggressive as possible with medical management to try and cure this infection so will proceed with PICC line and IV therapy as well given Coumadin interaction with FQ therapy.  Agree with vascular surgery re-evaluation to optimize blood flow to promote healing as best as possible.  He is scheduled for angiogram as well on 12/10/22.  See OPAT note below.  Follow-up in 3 weeks.  Diagnosis: Osteo  Culture Result: PsA, GBS  No Known Allergies  OPAT Orders Discharge antibiotics to be given via PICC line Discharge antibiotics: Per pharmacy protocol  Cefepime 2gm q12h  Duration: 6 weeks from PICC placement   [facility] Care Per Protocol:  Home health RN for IV administration and teaching; PICC line care  and labs.    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  Will repeat today to ensure remained undetectable and no further treatment needed.   Encounter for therapeutic drug monitoring Will reach out to coumadin clinic for INR monitoring while on Levaquin for his osteomyelitis.   Orders Placed This Encounter  Procedures   Sedimentation rate   C-reactive protein   CBC   Comp Met (CMET)   Hepatitis C RNA quantitative        [facility] 12/02/2022, 2:34 PM   I have personally spent 60 minutes involved in face-to-face and non-face-to-face activities for this patient on the day of the visit. Professional time spent includes the following activities: Preparing to see the patient (review of tests), Obtaining and/or reviewing separately obtained history (admission/discharge record), Performing a medically appropriate examination and/or evaluation , Ordering medications/tests/procedures, referring and communicating with other health care professionals, Documenting clinical information in the EMR, Independently interpreting results (not separately reported), Communicating results to the patient/family/caregiver, Counseling and educating the patient/family/caregiver and Care coordination (not separately reported).

## 2022-12-02 NOTE — Addendum Note (Signed)
Addended by: Daisy Floro T on: 12/02/2022 03:13 PM   Modules accepted: Orders

## 2022-12-02 NOTE — Assessment & Plan Note (Addendum)
Discussed with patient his diabetic foot/ischemic ulcer complicated by osteomyelitis and the severity of infection and risk of future limb loss.  He wants to exhaust his efforts at limb salvage.  We discussed his culture results and plan for IV antibiotics.  Will arrange for PICC line placement and check baseline lab work today.  Based on his historical creatinine clearance, will plan for cefepime 2 g every 12 hours but this may need to be adjusted based on the results today.  He is currently on levofloxacin from most recent cultures and will continue this through his PICC line placement on 12/14/22.  He actually will have received almost 4-5 weeks of antibiotics by the time PICC line placed.  We discussed continuing PO therapy alone but they would like to be aggressive as possible with medical management to try and cure this infection so will proceed with PICC line and IV therapy as well given Coumadin interaction with FQ therapy.  Agree with vascular surgery re-evaluation to optimize blood flow to promote healing as best as possible.  He is scheduled for angiogram as well on 12/10/22.  See OPAT note below.  Follow-up in 3 weeks.  Diagnosis: Osteo  Culture Result: PsA, GBS  No Known Allergies  OPAT Orders Discharge antibiotics to be given via PICC line Discharge antibiotics: Per pharmacy protocol  Cefepime 2gm q12h  Duration: 6 weeks from PICC placement   [facility] Care Per Protocol:  Home health RN for IV administration and teaching; PICC line care and labs.    Labs weekly while on IV antibiotics: _xx_ CBC with differential __ BMP _xx_ CMP _xx_ CRP _xx_ ESR __ Vancomycin trough __ CK  _xx_ Please pull PIC at completion of IV antibiotics __ Please leave PIC in place until doctor has seen patient or been notified  Fax weekly labs to [phone]

## 2022-12-02 NOTE — Patient Instructions (Signed)
You are at risk of a pneumothorax - you will need stop the hypercarbic oxygen immediately for chest pain, shortness of breath or cough   Please remember to go to the  x-ray department  for your tests - we will call you with the results when they are available     The key is to stop smoking completely before smoking completely stops you!

## 2022-12-02 NOTE — Telephone Encounter (Signed)
Received following message from Dr Carlis Abbott:   - I am seeing this patient today for the first time for bone infection due to pseudomonas.  looks like wound care and podiatry had him on a fluoroquinolone since 11/10/22...Marland KitchenMarland KitchenMarland Kitchennot sure if you were aware but just wanted to loop you in so he could be monitored if you did not know.  He is getting a PICC line on 2/5 (earliest they could do) so he is still going to be on Levaquin for another couple weeks. -     Pt has been on Levaquin since 11/10/22 and was seen in Coumadin Clinic 10 days later on 11/20/22 with therapeutic INR. Called and spoke with pt's daughter to possibly schedule Coumadin Clinic appt for this Friday to monitor INR; however, pt has procedure scheduled for 12/10/22 and has been instructed to HOLD Warfarin 3 days prior. Pt will have PICC placed on 12/14/22 and Levaquin will be completed. At this time, pt's Coumadin Clinic appt with remain scheduled for 12/21/22. Instructed pt's daughter to call Coumadin Clinic if there are any other changes in medication or procedures.

## 2022-12-02 NOTE — Patient Instructions (Signed)
Continue Levaquin until PICC line placed.  I sent refill today.  Once PICC line is placed, please stop Levaquin and only do the IV antibiotic called Cefepime.  You will do IV antibiotics for 6 weeks  Labs today.  Please call the Coumadin Clinic [phone] let them know you are currently on Levaquin as this may impact your Coumadin levels

## 2022-12-02 NOTE — Assessment & Plan Note (Signed)
Will reach out to coumadin clinic for INR monitoring while on Levaquin for his osteomyelitis.

## 2022-12-02 NOTE — Assessment & Plan Note (Signed)
Onset p fell on R chest 12/11/21 s/p R CT x 2 days only  - cxr 12/02/2022 wnl   Despite smoking hx he does not appear to have significant bullous changes on cxr nor significant copd at this point so no specific contraindication in this traumatic PTX hx setting to offering hyperbaric 02 if there is good likelihood of benefit.   Advised though for any cp,sob, cough during hyperbaric rx should immediately stop and assess for cause with pulmonary f/u prn         Each maintenance medication was reviewed in detail including emphasizing most importantly the difference between maintenance and prns and under what circumstances the prns are to be triggered using an action plan format where appropriate.  Total time for H and P, chart review, counseling, and generating customized AVS unique to this office visit / same day charting = 45 min with complex pt new to me

## 2022-12-02 NOTE — Progress Notes (Signed)
[name], [name] ([id number]) [id number] Page 1 of 12 Visit Report for 12/01/2022 Chief Complaint Document Details Patient Name: Date of Service: [name] [facility] [name]. 12/01/2022 2:00 PM Medical Record Number: [id number] Patient Account Number: [id number] Date of Birth/Sex: Treating RN: June 21, 1951 (72 y.o. M) Primary Care Provider: Richarda Blade Other Clinician: Referring Provider: Treating Provider/Extender: Velna Ochs, [name] in Treatment: 4 Information Obtained from: Patient Chief Complaint 07/16/2022; bilateral lower extremity wounds Electronic Signature(s) Signed: 12/01/2022 4:21:39 PM By: Geralyn Corwin DO Entered By: Geralyn Corwin on 12/01/2022 14:21:09 -------------------------------------------------------------------------------- Cellular or Tissue Based Product Details Patient Name: Date of Service: [name] [facility] [name]. 12/01/2022 2:00 PM Medical Record Number: [id number] Patient Account Number: [id number] Date of Birth/Sex: Treating RN: 04-19-1951 (72 y.o. [name] Primary Care Provider: Richarda Blade Other Clinician: Referring Provider: Treating Provider/Extender: Jae Dire in Treatment: 20 Cellular or Tissue Based Product Type Wound #1 Right Achilles Applied to: Performed By: Physician Geralyn Corwin, DO Cellular or Tissue Based Product Type: Grafix prime Level of Consciousness (Pre-procedure): Awake and Alert Pre-procedure Verification/Time Out Yes - 14:30 Taken: Location: trunk / arms / legs Wound Size (sq cm): 1.75 Product Size (sq cm): 6 Waste Size (sq cm): 0 Amount of Product Applied (sq cm): 6 Instrument Used: Forceps, Scissors Lot #: [id number] Expiration Date: 03/23/2024 Fenestrated: No Reconstituted: Yes Solution Type: normal saline Solution Amount: 7mL Lot #: [id number] Solution Expiration Date: 04/08/2025 Secured: Yes Secured With:  Steri-Strips Dressing Applied: Yes Primary Dressing: adaptic, gauze Procedural Pain: 0 Post Procedural Pain: 0 Response to Treatment: Procedure was tolerated well Level of Consciousness (Post- Awake and Alert procedure): [name], [name] ([id number]) [id number] Page 2 of 12 Post Procedure Diagnosis Same as Pre-procedure Electronic Signature(s) Signed: 12/01/2022 4:21:39 PM By: Geralyn Corwin DO Signed: 12/02/2022 4:09:44 PM By: Fonnie Mu RN Entered By: Fonnie Mu on 12/01/2022 14:35:00 -------------------------------------------------------------------------------- Debridement Details Patient Name: Date of Service: [name] [facility] [name]. 12/01/2022 2:00 PM Medical Record Number: [id number] Patient Account Number: [id number] Date of Birth/Sex: Treating RN: April 27, 1951 (72 y.o. [name], Lauren Primary Care Provider: Richarda Blade Other Clinician: Referring Provider: Treating Provider/Extender: Geralyn Corwin Ngetich, [name] in Treatment: 19 Debridement Performed for Assessment: Wound #1 Right Achilles Performed By: Physician Geralyn Corwin, DO Debridement Type: Debridement Severity of Tissue Pre Debridement: Fat layer exposed Level of Consciousness (Pre-procedure): Awake and Alert Pre-procedure Verification/Time Out Yes - 14:30 Taken: Start Time: 14:30 Pain Control: Lidocaine T Area Debrided (L x W): otal 3.5 (cm) x 0.5 (cm) = 1.75 (cm) Tissue and other material debrided: Viable, Non-Viable, Slough, Subcutaneous, Slough Level: Skin/Subcutaneous Tissue Debridement Description: Excisional Instrument: Curette Bleeding: Minimum Hemostasis Achieved: Pressure End Time: 14:30 Procedural Pain: 0 Post Procedural Pain: 0 Response to Treatment: Procedure was tolerated well Level of Consciousness (Post- Awake and Alert procedure): Post Debridement Measurements of Total Wound Length: (cm) 3.5 Width: (cm) 0.5 Depth: (cm)  0.1 Volume: (cm) 0.137 Character of Wound/Ulcer Post Debridement: Improved Severity of Tissue Post Debridement: Fat layer exposed Post Procedure Diagnosis Same as Pre-procedure Electronic Signature(s) Signed: 12/01/2022 4:21:39 PM By: Geralyn Corwin DO Signed: 12/02/2022 4:09:44 PM By: Fonnie Mu RN Entered By: Fonnie Mu on 12/01/2022 14:33:41 HPI Details -------------------------------------------------------------------------------- [name] ([id number]) [id number] Page 3 of 12 Patient Name: Date of Service: [name] 12/01/2022 2:00 PM Medical Record Number: [id number] Patient Account Number: [id number] Date of Birth/Sex: Treating RN: 05/04/1951 (72 y.o. M) Primary Care Provider: Richarda Blade  Other Clinician: Referring Provider: Treating Provider/Extender: Velna Ochs, [name] in Treatment: 19 History of Present Illness HPI Description: Admission 07/16/2022 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 He is not sure how the wounds started. He has been following with podiatry for this issue. He had removal of the tendon to the right lower extremity by Dr. Ralene Cork. He has been using Betadine to the wound beds. On 06/11/2022 he had [name] right posterior tibial artery angioplasty by Dr. Chestine Spore. It was reported the patient is optimized after revascularization of the right lower extremity. His ABIs on the left were 0.91. He currently denies systemic signs of infection. He also does not wear shoes. He came in with Kerlix wrap to his feet  bilaterally. This did not completely cover his feet. 07/23/2022: This is [name] patient of Dr. Neita Garnet that she asked me to take [name] look at last week due to the significant involvement of the muscle and tendon on his right posterior leg wound. He needed an aggressive debridement and asked me if I would be able to perform this on her behalf. The patient is here today for that procedure. When his dressing was removed in clinic today, the wound was teeming with maggots. There is necrotic muscle, tendon, and fat, with [name] thick layer of slough on the wound. 9/21; patient presents for follow-up. He was debrided by Dr. Lady Gary at last clinic visit without any issues. He has been placing Dakin's wet-to-dry dressings to the right posterior wound. He has been using Medihoney to the left foot wound. He reports improvement in wound healing. He has no issues or complaints today. 9/28; patient with type 2 diabetes PAD status post revascularization. He underwent retrograde right PTA angioplasty. Last saw Dr. Chestine Spore on 9/12 and was felt to have [name] brisk posterior tibial signal at the right ankle. He had [name] pulsatile toe tracing that appeared adequate for healing. He has been using Santyl and Hydrofera Blue on the right Achilles area. He has [name] smaller area on the left fifth plantar metatarsal head They were apparently in the ER on 9/23 with swelling and discoloration above the wrap. They have [name] picture of the leg after the wrap was taken off which looks like that it was excessively tight superiorly 10/6; patient presents for follow-up. We have been using Santyl and Hydrofera Blue to the right lower extremity wound under Kerlix/Coban. T the left foot we o have been using silver alginate with Medihoney. He again comes in with no shoes. He has no issues or complaints today. 10/12; patient presents for follow-up. We have been using Santyl and Hydrofera Blue to the right lower extremity under Kerlix/Coban And silver alginate to  the left foot wound. He has no issues today. He denies signs of infection. 10/19; patient presents for follow-up. We continue to use Santyl and Hydrofera Blue to the right lower extremity under Kerlix/Coban and silver alginate to the left foot wound. There is been improvement in wound healing. 10/26; patient presents for follow-up. We have been using Santyl and Hydrofera Blue to the right lower extremity under Kerlix/Coban and silver alginate to the left foot. There continues to be improvement  in wound healing. Patient has no issues or complaints today. 11/2; the patient's area on the right Achilles heel looks quite healthy and is improved in terms of measurements. We have been using Hydrofera Blue. He is approved for Grafix On the left plantar foot wound over the fifth metatarsal head this tunnels over the lateral part of the met head. He is not offloading this at all 11/16; patient presents for follow-up. He has been using [name] surgical shoe with an offloading felt pad to the left lateral foot along with silver alginate. He has been approved for Grafix and this is available for placement today T the right lower extremity wound. Patient Is agreeable to this. We have been using Santyl and o Hydrofera Blue under Kerlix/Coban to this area. 11/21; patient presents for follow-up. We have been doing Grafix to the right lower extremity and silver alginate to the left foot wound. He has no issues or complaints today. 11/30; patient presents for follow-up. We have been doing Grafix to the right lower extremity wound and Medihoney with Hydrofera Blue to the left lateral foot wound. He reports pain to the left foot wound today. He did not obtain his x-ray. 12/11; patient presents for follow-up. He has been using Dakin's wet-to-dry dressings to the left plantar foot wound. We have been doing Grafix to the right lower extremity wound under compression therapy. He obtained his x-ray that showed mild periosteal  elevation at the resection site with the possibility of osteomyelitis. He currently denies systemic signs of infection. He has been taking doxycycline and Augmentin without issues. He is getting his INR checked by his primary care office. 12/18; patient presents for follow-up. He has been using Dakin's wet-to-dry dressings to the left foot wound. We have been placing Grafix under compression therapy to the right lower extremity. He is currently taking doxycycline and Augmentin. He denies signs of infection. 12/26; patient presents for follow-up. He has been using Dakin's wet-to-dry dressings to the left foot. He has been off oral antibiotics for potential bone biopsy. We have been placing Grafix under compression therapy to the right lower extremity. There has been improvement in wound healing to [name] sites. He states he is trying to aggressively offload the left foot. He currently denies signs of infection. 1/2; patient presents for follow-up. He has been doing Dakin's wet-to-dry dressings to the left foot. He saw Dr. Ralene Cork today and had [name] bone biopsy. Debridement was performed on the site. Dressing in place with Dakin's wet-to-dry. T the right leg we have been using Grafix under compression therapy. He o has no issues or complaints today. 1/9; patient presents for follow-up. We have been using Hydrofera Blue under compression therapy to the right lower extremity wound. This is well-healing. He has been using Dakin's wet-to-dry dressings to the left lateral foot wound. He has been taking ciprofloxacin due to culture results without issues. He was referred to infectious disease by podiatry. He does not have an appointment yet. 1/16; patient presents for follow-up. We were using collagen under Kerlix/Coban to the right lower extremity wound. He has been approved for Grafix and this was available for placement today. He continues to use Dakin's wet-to-dry dressings daily to the left foot. He started  levofloxacin. He has an appointment with infectious disease on 1/24. 1/23; patient presents for follow-up. Grafix was placed in standard fashion to the right lower extremity wound last week. He has been using Dakin's wet-to-dry dressings to the left foot. He has been taking levofloxacin.  He has an infectious disease appointment tomorrow as well as [name] pulmonology appointment. He followed up with vein and vascular on 1/16 and plan is for angiogram of the left lower extremity. He has [name] history of left peroneal angioplasty in 2022. [name], [name] ([id number]) [id number] Page 4 of 12 Electronic Signature(s) Signed: 12/01/2022 4:21:39 PM By: [name] DO Entered By: [name] on 12/01/2022 14:26:27 -------------------------------------------------------------------------------- Physical Exam Details Patient Name: Date of Service: [name] [facility] [name]. 12/01/2022 2:00 PM Medical Record Number: [id number] Patient Account Number: [id number] Date of Birth/Sex: Treating RN: 07/17/1951 (72 y.o. M) Primary Care Provider: [name] Other Clinician: Referring Provider: Treating Provider/Extender: [name] Ngetich, [name] in Treatment: 19 Constitutional respirations regular, non-labored and within target range for patient.. Cardiovascular 2+ dorsalis pedis/posterior tibialis pulses. Psychiatric pleasant and cooperative. Notes Right lower extremity: T the posterior aspect there is an open wound with granulation tissue and nonviable tissue. Venous stasis dermatitis. T the left lower o o extremity there is An open wound that undermines and probes to bone. No surrounding signs of soft tissue infection. Electronic Signature(s) Signed: 12/01/2022 4:21:39 PM By: [name] DO Entered By: [name] on 12/01/2022 14:26:55 -------------------------------------------------------------------------------- Physician Orders Details Patient  Name: Date of Service: [name] [name] [name] [name]. 12/01/2022 2:00 PM Medical Record Number: [id number] Patient Account Number: [id number] Date of Birth/Sex: Treating RN: 03-10-1951 (72 y.o. [name] Primary Care Provider: [name] Other Clinician: Referring Provider: Treating Provider/Extender: [name] Ngetich, [name] in Treatment: 25 Verbal / Phone Orders: No Diagnosis Coding Follow-up Appointments ppointment in 1 week. - w/ Dr. Heber [place] Tuesday 12/08/22 @ 9:30 (already has appt.) Return [name] ppointment in 2 weeks. - w/ Dr. Heber [place] Tuesday 12/15/22 @ 1:30 Rm # 7 Return [name] Return appointment in 3 weeks. - w/ Dr. Heber  Tuesday 12/22/22 @ 12:45 Rm # 8 w/ Tammi Klippel Other: - keep Pulmonologist appt at end of February. Continue oral antibiotics till you see Infectious Disease. Ensure to see Vein and Vascular today for follow up tests. Anesthetic (In clinic) Topical Lidocaine 5% applied to wound bed Cellular or Tissue Based Products Wound #1 Right Achilles Cellular or Tissue Based Product Type: - Grafix approved-waiting to hear about copay or coinsurance-cost for you out of your pocket. Kerecsis denied by insurance. Grafix #1 applied on 09/24/22 FAVOR, HACKLER [name] ([id number]) [id number] Page 5 of 12 Grafix #2 applied on 09/29/2022 Grafix # 3 applied on 10/07/22 Grafix # 4 applied on 10/19/22 Grafix # 5 applied 10/26/2022 Grafix applied to right achilles wound 11/03/2022 Grafix pending insurance approval 11/24/2022 Grafix approved from now to 02/14/2023 total 12. #7 applied. Grafix # 8 applied 12/01/22 Cellular or Tissue Based Product applied to wound bed, secured with steri-strips, cover with Adaptic or Mepitel. (DO NOT REMOVE). Edema Control - Lymphedema / SCD / Other Avoid standing for long periods of time. Off-Loading Open toe surgical shoe to: - ****put PegAssist in surgical shoe.**** Prevalon Boot - left foot while resting in bed  at night. Other: - minimize walking and standing to aid in offloading pressure to wound. Hyperbaric Oxygen Therapy Wound #2 Left,Plantar Foot Evaluate for HBO Therapy Indication: - Wagner grade 3 If appropriate for treatment, begin HBOT per protocol: 2.0 ATA for 90 Minutes with 2 Five (5) Minute [name] Breaks ir Total Number of Treatments: - 40 One treatments per day (delivered Monday through Friday unless otherwise specified in Special Instructions below): Finger stick Blood Glucose Pre- and Post- HBOT Treatment. Follow Hyperbaric  Oxygen Glycemia Protocol [name] frin (Oxymetazoline HCL) 0.05% nasal spray - 1 spray in [name] nostrils daily as needed prior to HBO treatment for difficulty clearing ears Wound Treatment Wound #1 - Achilles Wound Laterality: Right Cleanser: Soap and Water 1 x Per Week/30 Days Discharge Instructions: May shower and wash wound with dial antibacterial soap and water prior to dressing change. Cleanser: Wound Cleanser 1 x Per Week/30 Days Discharge Instructions: Cleanse the wound with wound cleanser prior to applying [name] clean dressing using gauze sponges, not tissue or cotton balls. Peri-Wound Care: Sween Lotion (Moisturizing lotion) 1 x Per Week/30 Days Discharge Instructions: Apply moisturizing lotion as directed Prim Dressing: Grafix #7 1 x Per Week/30 Days ary Discharge Instructions: applied by provider. Prim Dressing: adpatic and steri-strips 1 x Per Week/30 Days ary Discharge Instructions: to secure grafix in place. do not remove. Secondary Dressing: ABD Pad, 5x9 1 x Per Week/30 Days Discharge Instructions: Apply over primary dressing as directed. Secondary Dressing: Woven Gauze Sponge, Non-Sterile 4x4 in 1 x Per Week/30 Days Discharge Instructions: Apply over primary dressing as directed. Compression Wrap: Kerlix Roll 4.5x3.1 (in/yd) 1 x Per Week/30 Days Discharge Instructions: Apply Kerlix and Coban compression as directed. Compression Wrap: Coban Self-Adherent  Wrap 4x5 (in/yd) 1 x Per Week/30 Days Discharge Instructions: Apply over Kerlix as directed. Wound #2 - Foot Wound Laterality: Plantar, Left Cleanser: Wound Cleanser 1 x Per Day/30 Days Discharge Instructions: Cleanse the wound with wound cleanser prior to applying [name] clean dressing using gauze sponges, not tissue or cotton balls. Peri-Wound Care: Skin Prep (Generic) 1 x Per Day/30 Days Discharge Instructions: Use skin prep as directed Prim Dressing: Dakin's Solution 0.25%, 16 (oz) 1 x Per Day/30 Days ary Discharge Instructions: Moisten gauze with Dakin's solution Secondary Dressing: Optifoam Non-Adhesive Dressing, 4x4 in (Generic) 1 x Per Day/30 Days Discharge Instructions: Apply over primary dressing as directed. Secondary Dressing: Woven Gauze Sponge, Non-Sterile 4x4 in (Generic) 1 x Per Day/30 Days Discharge Instructions: Apply over primary dressing as directed. Secondary Dressing: Woven Gauze Sponges 2x2 in (Generic) 1 x Per Day/30 Days Discharge Instructions: Apply over primary dressing as directed. Secured With: Insurance underwriter, Sterile 2x75 (in/in) (Generic) 1 x Per Day/30 Days UGONNA, KEIRSEY [name] ([id number]) [id number] Page 6 of 12 Discharge Instructions: Secure with stretch gauze as directed. Secured With: 83M Medipore H Soft Cloth Surgical T ape, 4 x 10 (in/yd) (Generic) 1 x Per Day/30 Days Discharge Instructions: Secure with tape as directed. GLYCEMIA INTERVENTIONS PROTOCOL PRE-HBO GLYCEMIA INTERVENTIONS ACTION INTERVENTION Obtain pre-HBO capillary blood glucose (ensure 1 physician order is in chart). [name]. Notify HBO physician and await physician orders. 2 If result is 70 mg/dl or below: B. If the result meets the hospital definition of [name] critical result, follow hospital policy. [name]. Give patient an 8 ounce Glucerna Shake, an 8 ounce Ensure, or 8 ounces of [name] Glucerna/Ensure equivalent dietary supplement*. B. Wait 30  minutes. If result is 71 mg/dl to 409 mg/dl: C. Retest patients capillary blood glucose (CBG). D. If result greater than or equal to 110 mg/dl, proceed with HBO. If result less than 110 mg/dl, notify HBO physician and consider holding HBO. If result is 131 mg/dl to 811 mg/dl: [name]. Proceed with HBO. [name]. Notify HBO physician and await physician orders. B. It is recommended to hold HBO and do If result is 250 mg/dl or greater: blood/urine ketone testing. C. If the result meets the hospital definition of [name] critical result, follow hospital policy. POST-HBO GLYCEMIA INTERVENTIONS ACTION INTERVENTION  Obtain post HBO capillary blood glucose (ensure 1 physician order is in chart). [name]. Notify HBO physician and await physician orders. 2 If result is 70 mg/dl or below: B. If the result meets the hospital definition of [name] critical result, follow hospital policy. [name]. Give patient an 8 ounce Glucerna Shake, an 8 ounce Ensure, or 8 ounces of [name] Glucerna/Ensure equivalent dietary supplement*. B. Wait 15 minutes for symptoms of If result is 71 mg/dl to 409 mg/dl: hypoglycemia (i.e. nervousness, anxiety, sweating, chills, clamminess, irritability, confusion, tachycardia or dizziness). C. If patient asymptomatic, discharge patient. If patient symptomatic, repeat capillary blood glucose (CBG) and notify HBO physician. If result is 101 mg/dl to 811 mg/dl: [name]. Discharge patient. [name]. Notify HBO physician and await physician orders. B. It is recommended to do blood/urine ketone If result is 250 mg/dl or greater: testing. C. If the result meets the hospital definition of [name] critical result, follow hospital policy. *Juice or candies are NOT equivalent products. If patient refuses the Glucerna or Ensure, please consult the hospital dietitian for an appropriate substitute. Electronic Signature(s) Signed: 12/01/2022 4:21:39 PM By: Geralyn Corwin DO Entered By: Geralyn Corwin on 12/01/2022  14:27:05 -------------------------------------------------------------------------------- Problem List Details Patient Name: Date of Service: [name] [facility] [name]. 12/01/2022 2:00 PM Medical Record Number: [id number] Patient Account Number: [id number] Date of Birth/Sex: Treating RN: 1951-09-06 (72 y.o. M) Primary Care Provider: Richarda Blade Other Clinician: Referring Provider: Treating Provider/Extender: Velna Ochs, [name] in Treatment: [address] [place] [name] ([id number]) [id number] Page 7 of 12 Active Problems ICD-10 Encounter Code Description Active Date MDM Diagnosis L97.518 Non-pressure chronic ulcer of other part of right foot with other specified 07/16/2022 No Yes severity L97.522 Non-pressure chronic ulcer of other part of left foot with fat layer exposed 07/16/2022 No Yes E11.621 Type 2 diabetes mellitus with foot ulcer 07/16/2022 No Yes I73.9 Peripheral vascular disease, unspecified 07/16/2022 No Yes 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 07/23/2022 No Yes muscle M86.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, left ankle and foot 11/10/2022 No Yes Inactive Problems Resolved Problems Electronic Signature(s) Signed: 12/01/2022 4:21:39 PM By: Geralyn Corwin DO Entered By: Geralyn Corwin on 12/01/2022 14:20:51 -------------------------------------------------------------------------------- Progress Note Details Patient Name: Date of Service: [name] [name]. 12/01/2022 2:00 PM Medical Record Number: [id number] Patient Account Number: [id number] Date of Birth/Sex: Treating RN: [birth year] (72 y.o. M) Primary Care Provider: Richarda Blade Other Clinician: Referring Provider: Treating Provider/Extender: Velna Ochs, [name] in Treatment: 37 Subjective Chief Complaint Information obtained from Patient 07/16/2022; bilateral lower extremity wounds History of Present Illness  (HPI) Admission 07/16/2022 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 He is not sure how the wounds started. He has been following with podiatry for this issue. He had removal of the tendon to the right lower extremity by Dr. Ralene Cork. He has been using Betadine to the wound beds. On 06/11/2022 he had [name] right posterior tibial artery angioplasty by Dr. Chestine Spore. It was reported the patient is optimized after revascularization of the right lower extremity. His ABIs on the left were 0.91. He currently denies systemic signs of infection. He also does  not wear shoes. He came in with Kerlix wrap to his feet bilaterally. This did not completely cover his feet. 07/23/2022: This is [name] patient of Dr. Neita Garnet that she asked me to take [name] look at last week due to the significant involvement of the muscle and tendon on his right posterior leg wound. He needed an aggressive debridement and asked me if I would be able to perform this on her behalf. The patient is here today for JOHNE, BUCKLE ([id number]) [id number] Page 8 of 12 that procedure. When his dressing was removed in clinic today, the wound was teeming with maggots. There is necrotic muscle, tendon, and fat, with [name] thick layer of slough on the wound. 9/21; patient presents for follow-up. He was debrided by Dr. Lady Gary at last clinic visit without any issues. He has been placing Dakin's wet-to-dry dressings to the right posterior wound. He has been using Medihoney to the left foot wound. He reports improvement in wound healing. He has no issues or  complaints today. 9/28; patient with type 2 diabetes PAD status post revascularization. He underwent retrograde right PTA angioplasty. Last saw Dr. Chestine Spore on 9/12 and was felt to have [name] brisk posterior tibial signal at the right ankle. He had [name] pulsatile toe tracing that appeared adequate for healing. He has been using Santyl and Hydrofera Blue on the right Achilles area. oo He has [name] smaller area on the left fifth plantar metatarsal head They were apparently in the ER on 9/23 with swelling and discoloration above the wrap. They have [name] picture of the leg after the wrap was taken off which looks like that it was excessively tight superiorly 10/6; patient presents for follow-up. We have been using Santyl and Hydrofera Blue to the right lower extremity wound under Kerlix/Coban. T the left foot we o have been using silver alginate with Medihoney. He again comes in with no shoes. He has no issues or complaints today. 10/12; patient presents for follow-up. We have been using Santyl and Hydrofera Blue to the right lower extremity under Kerlix/Coban And silver alginate to the left foot wound. He has no issues today. He denies signs of infection. 10/19; patient presents for follow-up. We continue to use Santyl and Hydrofera Blue to the right lower extremity under Kerlix/Coban and silver alginate to the left foot wound. There is been improvement in wound healing. 10/26; patient presents for follow-up. We have been using Santyl and Hydrofera Blue to the right lower extremity under Kerlix/Coban and silver alginate to the left foot. There continues to be improvement in wound healing. Patient has no issues or complaints today. 11/2; the patient's area on the right Achilles heel looks quite healthy and is improved in terms of measurements. We have been using Hydrofera Blue. He is approved for Grafix On the left plantar foot wound over the fifth metatarsal head this tunnels over the lateral part of the met head.  He is not offloading this at all 11/16; patient presents for follow-up. He has been using [name] surgical shoe with an offloading felt pad to the left lateral foot along with silver alginate. He has been approved for Grafix and this is available for placement today T the right lower extremity wound. Patient Is agreeable to this. We have been using Santyl and o Hydrofera Blue under Kerlix/Coban to this area. 11/21; patient presents for follow-up. We have been doing Grafix to the right lower extremity and silver alginate to the left foot wound. He has no issues or complaints  today. 11/30; patient presents for follow-up. We have been doing Grafix to the right lower extremity wound and Medihoney with Hydrofera Blue to the left lateral foot wound. He reports pain to the left foot wound today. He did not obtain his x-ray. 12/11; patient presents for follow-up. He has been using Dakin's wet-to-dry dressings to the left plantar foot wound. We have been doing Grafix to the right lower extremity wound under compression therapy. He obtained his x-ray that showed mild periosteal elevation at the resection site with the possibility of osteomyelitis. He currently denies systemic signs of infection. He has been taking doxycycline and Augmentin without issues. He is getting his INR checked by his primary care office. 12/18; patient presents for follow-up. He has been using Dakin's wet-to-dry dressings to the left foot wound. We have been placing Grafix under compression therapy to the right lower extremity. He is currently taking doxycycline and Augmentin. He denies signs of infection. 12/26; patient presents for follow-up. He has been using Dakin's wet-to-dry dressings to the left foot. He has been off oral antibiotics for potential bone biopsy. We have been placing Grafix under compression therapy to the right lower extremity. There has been improvement in wound healing to [name] sites. He states he is trying to  aggressively offload the left foot. He currently denies signs of infection. 1/2; patient presents for follow-up. He has been doing Dakin's wet-to-dry dressings to the left foot. He saw Dr. Ralene Cork today and had [name] bone biopsy. Debridement was performed on the site. Dressing in place with Dakin's wet-to-dry. T the right leg we have been using Grafix under compression therapy. He o has no issues or complaints today. 1/9; patient presents for follow-up. We have been using Hydrofera Blue under compression therapy to the right lower extremity wound. This is well-healing. He has been using Dakin's wet-to-dry dressings to the left lateral foot wound. He has been taking ciprofloxacin due to culture results without issues. He was referred to infectious disease by podiatry. He does not have an appointment yet. 1/16; patient presents for follow-up. We were using collagen under Kerlix/Coban to the right lower extremity wound. He has been approved for Grafix and this was available for placement today. He continues to use Dakin's wet-to-dry dressings daily to the left foot. He started levofloxacin. He has an appointment with infectious disease on 1/24. 1/23; patient presents for follow-up. Grafix was placed in standard fashion to the right lower extremity wound last week. He has been using Dakin's wet-to-dry dressings to the left foot. He has been taking levofloxacin. He has an infectious disease appointment tomorrow as well as [name] pulmonology appointment. He followed up with vein and vascular on 1/16 and plan is for angiogram of the left lower extremity. He has [name] history of left peroneal angioplasty in 2022. Patient History Information obtained from Chart. Family History Diabetes - Mother, Heart Disease - Mother,Siblings. Social History Current every day smoker, Alcohol Use - Never, Drug Use - No History, Caffeine Use - Never. Medical History Respiratory Patient has history of Sleep Apnea - does not  wear Cardiovascular Patient has history of Arrhythmia - [name]. Fib, Congestive Heart Failure - EF 25%, Hypertension, Peripheral Arterial Disease Denies history of Peripheral Venous Disease Endocrine Patient has history of Type II Diabetes Hospitalization/Surgery History - Angiogram 06/11/2022 Dr. Chestine Spore [facility]. - cardioversion 11/06/2021, 2015, 2017. 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 Medical [name] Surgical History Notes nd [name], [name] ([id number]) [id number] Page 9 of 12 Gastrointestinal Ascites Genitourinary stage III  Kidney disease Objective Constitutional respirations regular, non-labored and within target range for patient.. Vitals Time Taken: 2:17 PM, Height: 68 in, Weight: 185 lbs, BMI: 28.1, Temperature: 98 F, Pulse: 71 bpm, Respiratory Rate: 17 breaths/min, Blood Pressure: 181/91 mmHg. Cardiovascular 2+ dorsalis pedis/posterior tibialis pulses. Psychiatric pleasant and cooperative. General Notes: Right lower extremity: T the posterior aspect there is an open wound with granulation tissue and nonviable tissue. Venous stasis dermatitis. T o o the left lower extremity there is An open wound that undermines and probes to bone. No surrounding signs of soft tissue infection. Integumentary (Hair, Skin) Wound #1 status is Open. Original cause of wound was Surgical Injury. The date acquired was: 07/16/2022. The wound has been in treatment 19 weeks. The wound is located on the Right Achilles. The wound measures 3.5cm length x 0.5cm width x 0.1cm depth; 1.374cm^2 area and 0.137cm^3 volume. There is Fat Layer (Subcutaneous Tissue) exposed. There is no tunneling or undermining noted. There is [name] none present amount of drainage noted. The wound margin is distinct with the outline attached to the wound base. There is no granulation within the wound bed. There is [name] small (1-33%) amount of necrotic tissue within the wound bed including Eschar. The periwound skin  appearance exhibited: Dry/Scaly. The periwound skin appearance did not exhibit: Callus, Crepitus, Excoriation, Induration, Rash, Scarring, Maceration, Atrophie Blanche, Cyanosis, Ecchymosis, Hemosiderin Staining, Mottled, Pallor, Rubor, Erythema. Wound #2 status is Open. Original cause of wound was Gradually Appeared. The date acquired was: 03/09/2022. The wound has been in treatment 19 weeks. The wound is located on the Left,Plantar Foot. The wound measures 0.4cm length x 0.7cm width x 1.8cm depth; 0.22cm^2 area and 0.396cm^3 volume. There is bone and Fat Layer (Subcutaneous Tissue) exposed. There is [name] medium amount of serosanguineous drainage noted. The wound margin is distinct with the outline attached to the wound base. There is medium (34-66%) pink, friable granulation within the wound bed. There is [name] medium (34-66%) amount of necrotic tissue within the wound bed including Adherent Slough. The periwound skin appearance exhibited: Callus, Dry/Scaly. The periwound skin appearance did not exhibit: Crepitus, Excoriation, Induration, Rash, Scarring, Maceration, Atrophie Blanche, Cyanosis, Ecchymosis, Hemosiderin Staining, Mottled, Pallor, Rubor, Erythema. 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 I debrided nonviable tissue and Grafix was placed in standard fashion. We will continue with Kerlix Coban here. The left foot wound still probes to bone although there is more granulation tissue present today. T omorrow is the last day of  his levofloxacin. He sees infectious disease tomorrow as well as pulmonology. If cleared by pulmonology for HBO we will try and get this started in the next 1-2 weeks. He is also scheduled for repeat angiogram of the left lower extremity on 2/1 by vein and vascular. Follow-up in 1 week. Procedures Wound #1 Pre-procedure diagnosis of Wound #1 is [name] Diabetic Wound/Ulcer of the Lower Extremity located on the Right Achilles .Severity of Tissue Pre Debridement is: Fat layer exposed. There was [name] Excisional Skin/Subcutaneous Tissue Debridement with [name] total area of 1.75 sq cm performed by Geralyn Corwin, DO. With the  following instrument(s): Curette to remove Viable and Non-Viable tissue/material. Material removed includes Subcutaneous Tissue and Slough and after achieving pain control using Lidocaine. No specimens were taken. [name] time out was conducted at 14:30, prior to the start of the procedure. [name] Minimum amount of bleeding was controlled with Pressure. The procedure was tolerated well with [name] pain level of 0 throughout and [name] pain level of 0 following the procedure. Post Debridement Measurements: 3.5cm length x 0.5cm width x 0.1cm depth; 0.137cm^3 volume. Character of Wound/Ulcer Post Debridement is improved. Severity of Tissue Post Debridement is: Fat layer exposed. Post procedure Diagnosis Wound #1: Same as Pre-Procedure [name], [name] ([id number]) [phone].pdf Page 10 of 12 Pre-procedure diagnosis of Wound #1 is [name] Diabetic Wound/Ulcer of the Lower Extremity located on the Right Achilles. [name] skin graft procedure using [name] bioengineered skin substitute/cellular or tissue based product was performed by Geralyn Corwin, DO with the following instrument(s): Forceps and Scissors. Grafix prime was applied and secured with Steri-Strips. 6 sq cm of product was utilized and 0 sq cm was wasted. Post Application, adaptic, gauze was applied. [name] Time Out was conducted at 14:30, prior to  the start of the procedure. The procedure was tolerated well with [name] pain level of 0 throughout and [name] pain level of 0 following the procedure. Post procedure Diagnosis Wound #1: Same as Pre-Procedure . Plan Follow-up Appointments: Return Appointment in 1 week. - w/ Dr. Mikey Bussing Tuesday 12/08/22 @ 9:30 (already has appt.) Return Appointment in 2 weeks. - w/ Dr. Mikey Bussing Tuesday 12/15/22 @ 1:30 Rm # 7 Return appointment in 3 weeks. - w/ Dr. Mikey Bussing Tuesday 12/22/22 @ 12:45 Rm # 8 w/ Yvonne Kendall Other: - keep Pulmonologist appt at end of February. Continue oral antibiotics till you see Infectious Disease. Ensure to see Vein and Vascular today for follow up tests. Anesthetic: (In clinic) Topical Lidocaine 5% applied to wound bed Cellular or Tissue Based Products: Wound #1 Right Achilles: Cellular or Tissue Based Product Type: - Grafix approved-waiting to hear about copay or coinsurance-cost for you out of your pocket. Kerecsis denied by insurance. Grafix #1 applied on 09/24/22 Grafix #2 applied on 09/29/2022 Grafix # 3 applied on 10/07/22 Grafix # 4 applied on 10/19/22 Grafix # 5 applied 10/26/2022 Grafix applied to right achilles wound 11/03/2022 Grafix pending insurance approval 11/24/2022 Grafix approved from now to 02/14/2023 total 12. #7 applied. Grafix # 8 applied 12/01/22 Cellular or Tissue Based Product applied to wound bed, secured with steri-strips, cover with Adaptic or Mepitel. (DO NOT REMOVE). Edema Control - Lymphedema / SCD / Other: Avoid standing for long periods of time. Off-Loading: Open toe surgical shoe to: - ****put PegAssist in surgical shoe.**** Prevalon Boot - left foot while resting in bed at night. Other: - minimize walking and standing to aid in offloading pressure to wound. Hyperbaric Oxygen Therapy: Wound #2 Left,Plantar Foot: Evaluate for HBO Therapy Indication: - Wagner grade 3 If appropriate for treatment, begin HBOT per protocol: 2.0 ATA for 90 Minutes with 2 Five (5)  Minute Air Breaks T Number of Treatments: - 40 otal One treatments per day (delivered Monday through Friday unless otherwise specified in Special Instructions below): Finger stick Blood Glucose Pre- and Post- HBOT Treatment. Follow Hyperbaric Oxygen Glycemia Protocol Afrin (Oxymetazoline HCL) 0.05% nasal spray - 1 spray in [name] nostrils daily as needed prior to HBO treatment for difficulty clearing ears WOUND #1: - Achilles Wound Laterality: Right Cleanser: Soap and Water 1 x Per Week/30 Days Discharge Instructions: May shower and  wash wound with dial antibacterial soap and water prior to dressing change. Cleanser: Wound Cleanser 1 x Per Week/30 Days Discharge Instructions: Cleanse the wound with wound cleanser prior to applying [name] clean dressing using gauze sponges, not tissue or cotton balls. Peri-Wound Care: Sween Lotion (Moisturizing lotion) 1 x Per Week/30 Days Discharge Instructions: Apply moisturizing lotion as directed Prim Dressing: Grafix #7 1 x Per Week/30 Days ary Discharge Instructions: applied by provider. Prim Dressing: adpatic and steri-strips 1 x Per Week/30 Days ary Discharge Instructions: to secure grafix in place. do not remove. Secondary Dressing: ABD Pad, 5x9 1 x Per Week/30 Days Discharge Instructions: Apply over primary dressing as directed. Secondary Dressing: Woven Gauze Sponge, Non-Sterile 4x4 in 1 x Per Week/30 Days Discharge Instructions: Apply over primary dressing as directed. Com pression Wrap: Kerlix Roll 4.5x3.1 (in/yd) 1 x Per Week/30 Days Discharge Instructions: Apply Kerlix and Coban compression as directed. Com pression Wrap: Coban Self-Adherent Wrap 4x5 (in/yd) 1 x Per Week/30 Days Discharge Instructions: Apply over Kerlix as directed. WOUND #2: - Foot Wound Laterality: Plantar, Left Cleanser: Wound Cleanser 1 x Per Day/30 Days Discharge Instructions: Cleanse the wound with wound cleanser prior to applying [name] clean dressing using gauze sponges, not  tissue or cotton balls. Peri-Wound Care: Skin Prep (Generic) 1 x Per Day/30 Days Discharge Instructions: Use skin prep as directed Prim Dressing: Dakin's Solution 0.25%, 16 (oz) 1 x Per Day/30 Days ary Discharge Instructions: Moisten gauze with Dakin's solution Secondary Dressing: Optifoam Non-Adhesive Dressing, 4x4 in (Generic) 1 x Per Day/30 Days Discharge Instructions: Apply over primary dressing as directed. Secondary Dressing: Woven Gauze Sponge, Non-Sterile 4x4 in (Generic) 1 x Per Day/30 Days Discharge Instructions: Apply over primary dressing as directed. Secondary Dressing: Woven Gauze Sponges 2x2 in (Generic) 1 x Per Day/30 Days Discharge Instructions: Apply over primary dressing as directed. Secured With: Insurance underwriterConforming Stretch Gauze Bandage, Sterile 2x75 (in/in) (Generic) 1 x Per Day/30 Days Discharge Instructions: Secure with stretch gauze as directed. Secured With: 34M Medipore H Soft Cloth Surgical T ape, 4 x 10 (in/yd) (Generic) 1 x Per Day/30 Days Discharge Instructions: Secure with tape as directed. 1. In office sharp debridement 2. Grafix 3. Kerlix/Coban 4. Dakin's wet-to-dry dressings Joette CatchingODRONEIC, Muscab [name] ([id number]) [id number] Page 11 of 12 5. Follow-up in 1 week Electronic Signature(s) Signed: 12/01/2022 4:21:39 PM By: Geralyn CorwinHoffman, Jamoni Hewes DO Entered By: Geralyn CorwinHoffman, Yeraldine Forney on 12/01/2022 14:47:40 -------------------------------------------------------------------------------- HxROS Details Patient Name: Date of Service: [name] DRO NEIC, [name] [name]. 12/01/2022 2:00 PM Medical Record Number: [id number] Patient Account Number: [id number] Date of Birth/Sex: Treating RN: 03-11-1951 (72 y.o. M) Primary Care Provider: Richarda BladeNgetich, Dinah Other Clinician: Referring Provider: Treating Provider/Extender: Geralyn CorwinHoffman, Fritzie Prioleau Ngetich, [name] in Treatment: 6919 Information Obtained From Chart Respiratory Medical History: Positive for: Sleep Apnea - does not  wear Cardiovascular Medical History: Positive for: Arrhythmia - [name]. Fib; Congestive Heart Failure - EF 25%; Hypertension; Peripheral Arterial Disease Negative for: Peripheral Venous Disease Gastrointestinal Medical History: Past Medical History Notes: Ascites Endocrine Medical History: Positive for: Type II Diabetes Time with diabetes: 6 years Treated with: Insulin Blood sugar tested every day: No Genitourinary Medical History: Past Medical History Notes: stage III Kidney disease Immunizations Pneumococcal Vaccine: Received Pneumococcal Vaccination: No Implantable Devices No devices added Hospitalization / Surgery History Type of Hospitalization/Surgery Angiogram 06/11/2022 Dr. 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 Family and Social History Diabetes: Yes - Mother; Heart Disease: Yes - Mother,Siblings; Current every day smoker; Alcohol Use: Never; Drug Use: No  History; Caffeine Use: Never; Financial Concerns: No; Food, Clothing or Shelter Needs: No; Support System Lacking: No; Transportation Concerns: No CUINN, WESTERHOLD [name] ([id number]) [id number] Page 12 of 12 Electronic Signature(s) Signed: 12/01/2022 4:21:39 PM By: [name] DO Entered By: [name] on 12/01/2022 14:26:34 -------------------------------------------------------------------------------- SuperBill Details Patient Name: Date of Service: [name] [facility] [name]. 12/01/2022 Medical Record Number: [id number] Patient Account Number: [id number] Date of Birth/Sex: Treating RN: 11/18/1950 (72 y.o. 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 M86.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, left ankle and foot Facility Procedures : CPT4 Code: 27782423 Description: 53614 - WOUND CARE VISIT-LEV 3 EST PT Modifier: Quantity: 1 : CPT4 Code: 43154008 Description: Q7619- Grafix PL 2X3 sq cm (6 units) Modifier: Quantity: 6 : CPT4 Code: 50932671 Description: 24580 - SKIN SUB GRAFT TRNK/ARM/LEG ICD-10 Diagnosis Description L97.518 Non-pressure chronic ulcer of other part of right foot with other specified sever Modifier: ity Quantity: 1 Physician Procedures : CPT4 Code Description Modifier [id number] 50539 - WC PHYS LEVEL 3 - EST PT ICD-10 Diagnosis Description L97.522 Non-pressure chronic ulcer of other part of left foot with fat layer exposed E11.621 Type 2 diabetes mellitus with foot ulcer I73.9 Peripheral  vascular disease, unspecified M86.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, left ankle and foot Quantity: 1 : [id number] 15271 - WC PHYS SKIN SUB GRAFT TRNK/ARM/LEG ICD-10 Diagnosis Description L97.518 Non-pressure chronic ulcer of other part of right foot with other specified severity Quantity: 1 Electronic Signature(s) Signed: 12/01/2022 4:21:39 PM By: [name] DO Entered By: [name] on 12/01/2022 14:48:08

## 2022-12-03 ENCOUNTER — Encounter: Payer: Self-pay | Admitting: Cardiovascular Disease

## 2022-12-03 NOTE — Telephone Encounter (Signed)
Error

## 2022-12-04 LAB — CBC
HCT: 37.9 % — ABNORMAL LOW (ref 38.5–50.0)
Hemoglobin: 12.7 g/dL — ABNORMAL LOW (ref 13.2–17.1)
MCH: 29.6 pg (ref 27.0–33.0)
MCHC: 33.5 g/dL (ref 32.0–36.0)
MCV: 88.3 fL (ref 80.0–100.0)
MPV: 11.6 fL (ref 7.5–12.5)
Platelets: 161 10*3/uL (ref 140–400)
RBC: 4.29 10*6/uL (ref 4.20–5.80)
RDW: 13.7 % (ref 11.0–15.0)
WBC: 7.4 10*3/uL (ref 3.8–10.8)

## 2022-12-04 LAB — COMPREHENSIVE METABOLIC PANEL
AG Ratio: 1.1 (calc) (ref 1.0–2.5)
ALT: 16 U/L (ref 9–46)
AST: 14 U/L (ref 10–35)
Albumin: 3.9 g/dL (ref 3.6–5.1)
Alkaline phosphatase (APISO): 34 U/L — ABNORMAL LOW (ref 35–144)
BUN/Creatinine Ratio: 18 (calc) (ref 6–22)
BUN: 29 mg/dL — ABNORMAL HIGH (ref 7–25)
CO2: 29 mmol/L (ref 20–32)
Calcium: 9.1 mg/dL (ref 8.6–10.3)
Chloride: 106 mmol/L (ref 98–110)
Creat: 1.62 mg/dL — ABNORMAL HIGH (ref 0.70–1.28)
Globulin: 3.4 g/dL (calc) (ref 1.9–3.7)
Glucose, Bld: 141 mg/dL — ABNORMAL HIGH (ref 65–99)
Potassium: 4.5 mmol/L (ref 3.5–5.3)
Sodium: 141 mmol/L (ref 135–146)
Total Bilirubin: 0.7 mg/dL (ref 0.2–1.2)
Total Protein: 7.3 g/dL (ref 6.1–8.1)

## 2022-12-04 LAB — C-REACTIVE PROTEIN: CRP: 7 mg/L (ref <8.0)

## 2022-12-04 LAB — HEPATITIS C RNA QUANTITATIVE
HCV Quantitative Log: <1.18 log IU/mL
HCV RNA, PCR, QN: <15 IU/mL

## 2022-12-04 LAB — SEDIMENTATION RATE: Sed Rate: 38 mm/h — ABNORMAL HIGH (ref 0–20)

## 2022-12-07 NOTE — Progress Notes (Signed)
Called pt and left detailed msg on machine with results ok per DPR. 

## 2022-12-08 ENCOUNTER — Encounter (HOSPITAL_BASED_OUTPATIENT_CLINIC_OR_DEPARTMENT_OTHER): Payer: Medicare HMO | Admitting: Internal Medicine

## 2022-12-08 DIAGNOSIS — M86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, left ankle and foot: Secondary | ICD-10-CM | POA: Diagnosis not present

## 2022-12-08 DIAGNOSIS — E1140 Type 2 diabetes mellitus with diabetic neuropathy, unspecified: Secondary | ICD-10-CM | POA: Diagnosis not present

## 2022-12-08 DIAGNOSIS — L97522 Non-pressure chronic ulcer of other part of left foot with fat layer exposed: Secondary | ICD-10-CM | POA: Diagnosis not present

## 2022-12-08 DIAGNOSIS — E11621 Type 2 diabetes mellitus with foot ulcer: Secondary | ICD-10-CM | POA: Diagnosis not present

## 2022-12-08 DIAGNOSIS — E1151 Type 2 diabetes mellitus with diabetic peripheral angiopathy without gangrene: Secondary | ICD-10-CM | POA: Diagnosis not present

## 2022-12-08 DIAGNOSIS — E1169 Type 2 diabetes mellitus with other specified complication: Secondary | ICD-10-CM | POA: Diagnosis not present

## 2022-12-08 DIAGNOSIS — L97813 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: Secondary | ICD-10-CM

## 2022-12-08 DIAGNOSIS — E11622 Type 2 diabetes mellitus with other skin ulcer: Secondary | ICD-10-CM | POA: Diagnosis not present

## 2022-12-08 DIAGNOSIS — L97512 Non-pressure chronic ulcer of other part of right foot with fat layer exposed: Secondary | ICD-10-CM | POA: Diagnosis not present

## 2022-12-09 ENCOUNTER — Encounter: Payer: Self-pay | Admitting: Physician Assistant

## 2022-12-09 ENCOUNTER — Ambulatory Visit: Payer: Medicare HMO | Attending: Physician Assistant | Admitting: Physician Assistant

## 2022-12-09 VITALS — BP 132/84 | HR 132 | Ht 68.0 in | Wt 209.8 lb

## 2022-12-09 DIAGNOSIS — M86472 Chronic osteomyelitis with draining sinus, left ankle and foot: Secondary | ICD-10-CM

## 2022-12-09 DIAGNOSIS — Z794 Long term (current) use of insulin: Secondary | ICD-10-CM | POA: Diagnosis not present

## 2022-12-09 DIAGNOSIS — L97519 Non-pressure chronic ulcer of other part of right foot with unspecified severity: Secondary | ICD-10-CM

## 2022-12-09 DIAGNOSIS — I739 Peripheral vascular disease, unspecified: Secondary | ICD-10-CM

## 2022-12-09 DIAGNOSIS — I5022 Chronic systolic (congestive) heart failure: Secondary | ICD-10-CM | POA: Diagnosis not present

## 2022-12-09 DIAGNOSIS — L97529 Non-pressure chronic ulcer of other part of left foot with unspecified severity: Secondary | ICD-10-CM | POA: Diagnosis not present

## 2022-12-09 DIAGNOSIS — I4892 Unspecified atrial flutter: Secondary | ICD-10-CM

## 2022-12-09 DIAGNOSIS — E118 Type 2 diabetes mellitus with unspecified complications: Secondary | ICD-10-CM | POA: Diagnosis not present

## 2022-12-09 MED ORDER — METOPROLOL SUCCINATE ER 50 MG PO TB24: 50.0000 mg | ORAL_TABLET | Freq: Every day | ORAL | 3 refills | Status: DC

## 2022-12-09 NOTE — Progress Notes (Addendum)
Cardiology Office Note:    Date:  12/10/2022   ID:  [name], DOB Feb 13, 1951, MRN [id number]  PCP:  Caesar Bookman, NP   [facility] Providers Cardiologist:  Nicki Guadalajara, MD Advanced Heart Failure:  Arvilla Meres, MD     Referring MD: Caesar Bookman, NP   Chief Complaint  Patient presents with   Follow-up    Referred to cardiology for evaluation of possibly starting Hyperbaric chamber for wound healing    History of Present Illness:    [name] is a 72 y.o. male with a hx of depression, PAF, chronic systolic heart failure, DM 2, CKD stage III, obesity and OSA.  Patient was admitted in June 2015 with lower extremity edema and found to be in A-fib with RVR.  He underwent IV diuresis.  Patient was readmitted in February 2017 with volume overload and A-fib RVR again due to medication noncompliance and required IV diuresis.  TEE revealed LAA thrombus, cardioversion was canceled.  He was readmitted in May 2017 with the same issue again in the setting of medication noncompliance.  He a refused cath.  Myoview demonstrated EF 20% with fixed defect within the inferior wall suggesting ischemic etiology, there was no reversibility.  He was started on heart failure meant.  He was evaluated by psychiatry service who felt he did have ability to make decisions.  He underwent IV diuresis and discharged on 80 mg twice a day of Lasix.  Repeat echocardiogram in May 2017 shows EF 15 to 20%.  Fortunately, by the time he was seen by heart failure service in 2021, his EF has normalized.  He was readmitted in December 2022 with volume overload, echo at the time showed EF 45 to 50%.  He was placed on IV diuresis and removed 19 L.  Hospital course complicated by AKI and A-fib.  He underwent TEE DCCV with conversion to sinus rhythm.  Beta-blocker held due to bradycardia.  He was placed on amiodarone therapy.  He was admitted in February 2023 with mechanical fall that resulted in rib  fracture and pneumothorax.  Anticoagulation was held due to risk for fall.  He was resumed on Coumadin in late February 2023.  In August 2023, he had a critical limb ischemia of right lower extremity with nonhealing ulcer, he underwent right posterior tibial angiography by Dr. Chestine Spore.  He was last seen by heart failure service in September 2023, at which time he was still smoking at least 20 cigars/day.  He also had right lower extremity wound and the swelling and has been followed by wound clinic.  He was on Toprol-XL, Lasix 20 mg twice a day, Entresto, Jardiance and the spironolactone.  He has also been followed by wound clinic and infectious disease.  According to wound clinic note, he has nonhealing ulcer in the back of the right leg and left foot.  He previously underwent angioplasty of the right posterior tibial artery by Dr. Chestine Spore in August 2023.  When he returned to the wound clinic in September 2023, it was found that he has significant involvement of muscle and tendon of the right posterior leg wound.  After his dressing was removed, the wound was teaming with maggots was underlying necrotic muscle, tendon and fat.  He under debridement by wound clinic.  Since then, he has been followed by the wound clinic on a weekly basis.  In December, he was placed on a course of doxycycline and Augmentin.  He recently saw  Dr. 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  He has been on Levaquin since the beginning of 2024.  Dr. Chestine Spore recommended lower extremity angiography procedure tomorrow, he has been instructed to hold Coumadin for 3 days prior.  He was also seen by Dr. Mikey Bussing on 12/08/2022 for management of chronic ulcer in the left foot and the right foot.  He has been cleared by pulmonology service for hyperbaric oxygen therapy.  He was referred to cardiology service to clear him for HBO starting next week.  Patient was referred to general cardiology service to discuss possibility of starting on hyperbaric  chamber treatment for wound care.  He is currently scheduled for lower extremity angiography tomorrow and has been holding his Coumadin since Sunday.  Although he is listed as Dr. Landry Dyke patient, I do not see that he has ever seen Dr. Tresa Endo or general cardiologist in the past.  On physical exam, he appears to be euvolemic.  According to his daughter, sometimes he would miss the nighttime dose of the Lasix and Entresto, however largely compliant with medication.  He may have missed 2 nighttime doses of Lasix and Entresto last week.  He walks around with crutches.  EKG today shows 2-1 atrial flutter with RVR.  Looking back, previous EKG in June 2023 also suggest 2-1 atrial flutter as well.  The last time he was in normal sinus rhythm was in February 2023 based on EKG in Epic.  He has no cardiac awareness of a fib/atrial flutter.  I discussed his case with Dr. Servando Salina DOD.  We will increase his metoprolol succinate to 50 mg [name].  We decided not to hold tomorrow's lower extremity angiography as it is a relatively low risk procedure and he has already stopped Coumadin therapy, therefore it would be high risk for [place] to convert him.  Dr. Servando Salina recommend I discuss with Dr. Gala Romney regarding hyperbaric chamber treatment.  I discussed with the patient possibility of starting on antiarrhythmic therapy and doing another cardioversion, he was very hesitant to do another cardioversion at this time as he think it would be a futile effort and waste of time and money.  I mention to him that having antiarrhythmic therapy on board may help him hold sinus rhythm longer. I do think he will need antiarrhythmic therapy in order to hold sinus rhythm.  It is not entirely clear to me why the previous amiodarone therapy was discontinued.  According to medication history, it appears patient reported he was not taking amiodarone on 01/22/2022.  Patient does not remember the medication amiodarone, it does not sound like he was actually having  reactions with the medication.  We did not try to add amiodarone back today because he has been off of anticoagulation therapy for the past 3 days, we recommend early follow-up and potentially reinitiate antiarrhythmic therapy at that time.  Again, at this time patient does not want cardioversion, this will need to be discussed further on follow-up.  Will need to also consider addition of digoxin to help control the heart rate.  For the time being we will increase beta-blocker.  Once his heart rate improved, then we can consider repeat echocardiogram, I would not be surprised if his EF is down again as he likely has been in RVR since at least June last year.  Past Medical History:  Diagnosis Date   A-fib [facility])    a. (06/04/14) TEE-DC-CV; succesful; large LA 6.2 cm   ADHD    Ascites    Athlete's  foot    Chronic systolic heart failure (HCC)    a. 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, unspecified CKD stage, unspecified whether long term insulin use (HCC)    Heart murmur    HTN (hypertension)    OSA (obstructive sleep apnea)    PVD (peripheral vascular disease) (HCC)    s/p great toe amputation    Past Surgical History:  Procedure Laterality Date   ABDOMINAL AORTOGRAM W/LOWER EXTREMITY N/A 05/19/2021   Procedure: ABDOMINAL AORTOGRAM W/LOWER EXTREMITY;  Surgeon: Cephus Shelling, MD;  Location: [facility] INVASIVE CV LAB;  Service: Cardiovascular;  Laterality: N/A;   ABDOMINAL AORTOGRAM W/LOWER EXTREMITY Right 06/11/2022   Procedure: ABDOMINAL AORTOGRAM W/LOWER EXTREMITY;  Surgeon: Cephus Shelling, MD;  Location: [facility] INVASIVE CV LAB;  Service: Cardiovascular;  Laterality: Right;   AMPUTATION Left 05/22/2021   Procedure: LEFT FIFTH TOE AMPUTATION;  Surgeon: Cephus Shelling, MD;  Location:  [facility] OR;  Service: Vascular;  Laterality: Left;   CARDIOVERSION N/A 06/04/2014   Procedure: CARDIOVERSION;  Surgeon: Laurey Morale, MD;  Location: [facility] ENDOSCOPY;  Service: Cardiovascular;  Laterality: N/A;   CARDIOVERSION N/A 11/06/2021   Procedure: CARDIOVERSION;  Surgeon: Laurey Morale, MD;  Location: [facility] ENDOSCOPY;  Service: Cardiovascular;  Laterality: N/A;   NOSE SURGERY     Nasal septum surgery   PERIPHERAL VASCULAR BALLOON ANGIOPLASTY  06/11/2022   Procedure: PERIPHERAL VASCULAR BALLOON ANGIOPLASTY;  Surgeon: Cephus Shelling, MD;  Location: [facility] INVASIVE CV LAB;  Service: Cardiovascular;;   PERIPHERAL VASCULAR INTERVENTION Left 05/19/2021   Procedure: PERIPHERAL VASCULAR INTERVENTION;  Surgeon: Cephus Shelling, MD;  Location: [facility] INVASIVE CV LAB;  Service: Cardiovascular;  Laterality: Left;   TEE WITHOUT CARDIOVERSION N/A 06/04/2014   Procedure: TRANSESOPHAGEAL ECHOCARDIOGRAM (TEE);  Surgeon: Laurey Morale, MD;  Location: [facility] ENDOSCOPY;  Service: Cardiovascular;  Laterality: N/A;   TEE WITHOUT CARDIOVERSION N/A 01/06/2016   Procedure: TRANSESOPHAGEAL ECHOCARDIOGRAM (TEE);  Surgeon: Pricilla Riffle, MD;  Location: [facility] ENDOSCOPY;  Service: Cardiovascular;  Laterality: N/A;   TEE WITHOUT CARDIOVERSION N/A 11/06/2021   Procedure: TRANSESOPHAGEAL ECHOCARDIOGRAM (TEE);  Surgeon: Laurey Morale, MD;  Location: [facility] ENDOSCOPY;  Service: Cardiovascular;  Laterality: N/A;   VASECTOMY      Current Medications: Current Meds  Medication Sig   APPLE CIDER VINEGAR PO Take 30 mLs by mouth 4 (four) times a week.   blood glucose meter kit and supplies Dispense based on patient and insurance preference. Use up to four times [name] as directed. (FOR ICD-10 E10.9, E11.9).   clopidogrel (PLAVIX) 75 MG tablet Take 1 tablet (75 mg total) by mouth [name].   diclofenac Sodium (VOLTAREN) 1 % GEL Apply 1 Application topically 4 (four) times [name] as needed (pain).   empagliflozin (JARDIANCE) 10 MG TABS tablet  TAKE 1 TABLET(10 MG) BY MOUTH [name]   ENTRESTO 49-51 MG TAKE 1 TABLET BY MOUTH TWICE [name]   furosemide (LASIX) 20 MG tablet TAKE 1 TABLET(20 MG) BY MOUTH TWICE [name]   insulin glargine (LANTUS) 100 UNIT/ML injection INJECT 8 UNITS UNDER THE SKIN [name]   Insulin Syringe-Needle U-100 (INSULIN SYRINGE 1CC/30GX5/16") 30G X 5/16" 1 ML MISC Use once [name] for insulin injections. Dx:E11.22   [EXPIRED] levofloxacin (LEVAQUIN) 750 MG tablet Take 1 tablet (750 mg total) by  mouth [name] for 7 days.   Misc Natural Products (JOINT SUPPORT PO) Take 1 Dose by mouth 3 (three) times a week.   spironolactone (ALDACTONE) 25 MG tablet Take 0.5 tablets (12.5 mg total) by mouth [name].   warfarin (COUMADIN) 6 MG tablet Take 0.5-1 tablets (3-6 mg total) by mouth See admin instructions. Take 6 mg on Mon and Fri Take 3 mg on Sun, Tues, Wed, Thurs, and Sat   [DISCONTINUED] metoprolol succinate (TOPROL XL) 25 MG 24 hr tablet Take 1 tablet (25 mg total) by mouth [name].     Allergies:   Patient has no known allergies.   Social History   Socioeconomic History   Marital status: Single    Spouse name: Not on file   Number of children: Not on file   Years of education: Not on file   Highest education level: Not on file  Occupational History   Occupation: retired  Tobacco Use   Smoking status: Some Days    Years: 52.00    Types: Cigarettes, Cigars    Last attempt to quit: 05/2021    Years since quitting: 1.5    Passive exposure: Never   Smokeless tobacco: Never  Vaping Use   Vaping Use: Never used  Substance and Sexual Activity   Alcohol use: Not Currently   Drug use: Never   Sexual activity: Not on file  Other Topics Concern   Not on file  Social History Narrative   ** Merged History Encounter ** Lives in [place] by himself. Retired from [employer] for [employer].       Tobacco use, amount per day now:   Past tobacco use, amount per day:   How many years did you use tobacco:    Alcohol use (drinks per week): N/A   Diet:   Do you drink/eat things with caffeine:   Marital status:  Divorced                                What year were you married? 2003   Do you live in a house, apartment, assisted living, condo, trailer, etc.? House   Is it one or more stories? One   How many persons live in your home?   Do you have pets in your home?( please list) N/A   Highest Level of education completed? Bachelors Degree   Current or past profession: Teacher-Special Ed   Do you exercise?  Yes                                Type and how often? [name] squats, and push ups.   Do you have a living will? No   Do you have a DNR form?  No                                 If not, do you want to discuss one?   Do you have signed POA/HPOA forms?  No                      If so, please bring to you appointment      Do you have any difficulty bathing or dressing yourself? Bathing only if pants not shirts/not shorts.   Do you have any difficulty preparing food or  eating? No   Do you have any difficulty managing your medications? No   Do you have any difficulty managing your finances? No   Do you have any difficulty affording your medications? No   Social Determinants of Health   Financial Resource Strain: Not on file  Food Insecurity: No Food Insecurity (11/10/2021)   Hunger Vital Sign    Worried About Running Out of Food in the Last Year: Never true    Ran Out of Food in the Last Year: Never true  Transportation Needs: No Transportation Needs (11/10/2021)   PRAPARE - Administrator, Civil Service (Medical): No    Lack of Transportation (Non-Medical): No  Physical Activity: Not on file  Stress: Not on file  Social Connections: Not on file     Family History: The patient's family history includes Diabetes in his mother; Heart attack in his mother and sister.  ROS:   Please see the history of present illness.     All other systems reviewed and are  negative.  EKGs/Labs/Other Studies Reviewed:    The following studies were reviewed today:  Echo 10/29/2021  1. Left ventricular ejection fraction, by estimation, is 45 to 50%. The  left ventricle has mildly decreased function. The left ventricle  demonstrates regional wall motion abnormalities (see scoring  diagram/findings for description). Left ventricular  diastolic parameters are consistent with Grade II diastolic dysfunction  (pseudonormalization). Elevated left atrial pressure. There is mild global  left ventricular hypokinesis, slightly worse in the basal and mid segments  of the inferior and  inferolateral walls.   2. Right ventricular systolic function is moderately reduced. The right  ventricular size is moderately enlarged. There is severely elevated  pulmonary artery systolic pressure. The estimated right ventricular  systolic pressure is 84.6 mmHg.   3. Left atrial size was moderately dilated.   4. Right atrial size was moderately dilated.   5. The mitral valve is normal in structure. No evidence of mitral valve  regurgitation.   6. Tricuspid valve regurgitation is mild to moderate.   7. The aortic valve is tricuspid. Aortic valve regurgitation is not  visualized. No aortic stenosis is present.   8. The inferior vena cava is dilated in size with <50% respiratory  variability, suggesting right atrial pressure of 15 mmHg.   Comparison(s): Prior images reviewed side by side. The overall left  ventricular function is worsened. The left ventricular diastolic function  is unchanged. The left ventricular wall motion abnormalities are  unchanged. The striking changes involve marked  worsening of the pulmonary hypertension, tricuspid insufficiency and right  ventricular dysfunction.    EKG:  EKG is ordered today.  The ekg ordered today demonstrates 2-1 atrial flutter  Recent Labs: 12/26/2021: B Natriuretic Peptide 128.8 06/18/2022: TSH 1.93 12/02/2022: ALT 16; Platelets  161 12/10/2022: BUN 30; Creatinine, Ser 1.90; Hemoglobin 14.3; Potassium 4.0; Sodium 143  Recent Lipid Panel    Component Value Date/Time   CHOL 168 06/18/2022 1029   TRIG 96 06/18/2022 1029   HDL 37 (L) 06/18/2022 1029   CHOLHDL 4.5 06/18/2022 1029   VLDL 15 03/15/2016 0525   LDLCALC 111 (H) 06/18/2022 1029     Risk Assessment/Calculations:    CHA2DS2-VASc Score = 5   This indicates a 7.2% annual risk of stroke. The patient's score is based upon: CHF History: 1 HTN History: 1 Diabetes History: 1 Stroke History: 0 Vascular Disease History: 1 Age Score: 1 Gender Score: 0  Physical Exam:    VS:  BP 132/84   Pulse (!) 132   Ht 5\' 8"  (1.727 m)   Wt 209 lb 12.8 oz (95.2 kg)   SpO2 98%   BMI 31.90 kg/m         Wt Readings from Last 3 Encounters:  12/10/22 204 lb (92.5 kg)  12/09/22 209 lb 12.8 oz (95.2 kg)  12/02/22 206 lb (93.4 kg)     GEN:  Well nourished, well developed in no acute distress HEENT: Normal NECK: No JVD; No carotid bruits LYMPHATICS: No lymphadenopathy CARDIAC: RRR, no murmurs, rubs, gallops RESPIRATORY:  Clear to auscultation without rales, wheezing or rhonchi  ABDOMEN: Soft, non-tender, non-distended MUSCULOSKELETAL:  No edema; No deformity  SKIN: Warm and dry NEUROLOGIC:  Alert and oriented x 3 PSYCHIATRIC:  Normal affect   ASSESSMENT:    1. Atrial flutter, unspecified type (HCC)   2. Chronic systolic heart failure (HCC)   3. Controlled type 2 diabetes mellitus with complication, with long-term current use of insulin (HCC)   4. 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 Ulcer of both feet, unspecified ulcer stage (HCC)   6. PAD (peripheral artery disease) (HCC)    PLAN:    In order of problems listed above:  Atrial flutter with RVR: History of A-fib.  Unclear duration, although previous EKG in June 2023 mention sinus rhythm, on closer look, he likely already has flutter wave at the time.  Last sinus  EKG was from February of last year.  He is currently off of Coumadin for 3 days in anticipation of lower extremity angiography tomorrow.  I discussed the case with DOD Dr. Servando Salina, we did not attempt to stop the procedure as it is fairly low risk, we think it would be more prudent for [place] to address the atrial flutter with RVR once he is able to resume uninterrupted anticoagulation for at least 2 months.  He used to be on amiodarone, for some reason patient reported he stopped the medication in March 2023, and amiodarone was removed from his medication list.  I think we should restart amiodarone therapy and proceed with cardioversion afterward, however patient states he does not want cardioversion as he think it would be a waste of time and money due to early recurrence.  I mentioned to the patient with the help of antiarrhythmic therapy, he can potentially maintain sinus rhythm longer.  I fear is Mr. Armbrister has very poor insight in the detrimental effect of persistent RVR and appeared nonchalant when discussing his rhythm issue and heart failure issue.  Fortunately he is euvolemic on today's exam and appears to be taking his heart failure meds (with only occasional missed night time doses) but I would not be surprised if his EF is down.  Once his heart rate come down, we should repeat an echocardiogram.  He need to close follow-up with heart failure service.  Chronic systolic heart failure: He has a history of medication noncompliance.  EF has been as low as 15 to 20% in May 2017, however normalized by 2021.  Most recent echocardiogram obtained in December 2022 showed EF 45 to 50%.  He did undergo TEE cardioversion at the time.  DM2: On insulin and Jardiance.  I highly question if he would really be a good candidate for Jardiance, my fear is he has nonhealing leg wound.  Per wound care report, 69-month visit in September of last year, after dressing was removed, he is  leg wound was teaming with maggots and the  necrotic tissue.  Leg wound appears to have improved per wound care report since then, however I fear Jardiance may not be the best medication in his case.  Chronic osteomyelitis: Followed by infectious disease.  He has been on multiple rounds of antibiotic including Augmentin and doxycycline in December, Cipro then most recently Levaquin.  Deep tissue wound culture obtained on 11/03/2022 grew Pseudomonas aeruginosa and Serratia Marci since.  He also had a wound culture in early January that grew Pseudomonas aeruginosa and group B streptococcus.  He was seen by podiatry service and they discussed the risk of limb loss and infection as well as surgery to remove the infected bones versus long-term antibiotics, patient opted for the later.  He is undergoing lower extremity angiography for limb salvage.  Foot ulcer: Followed by podiatry service and wound clinic.  Dr. Mikey Bussing of wound clinic recommended hyperbaric oxygen therapy.  I discussed the case with DOD Dr. Servando Salina will recommend to discuss with Dr. Gala Romney to see if he can proceed from the heart failure perspective. Based on PubMed article "Effect of hyperbaric oxygen treatment on patients with reduced left ventricular ejection fraction" by Dewain Penning et al, majority of the patient do ok with hyperbaric oxygen treatment.   Addendum: case discussed with Dr. Gala Romney, he is cleared from cardiac and heart failure perspective to proceed with HBO treatment.   PAD: Patient has underwent lower extremity intervention in the past.  He is due for upcoming lower extremity angiography tomorrow.  He has already held Coumadin for 3 days.  EKG today shows 2-1 atrial flutter with RVR, I discussed the case with DOD Dr. Servando Salina, we will focus on treating atrial flutter with RVR after his procedure.           Medication Adjustments/Labs and Tests Ordered: Current medicines are reviewed at length with the patient today.  Concerns regarding medicines are outlined  above.  Orders Placed This Encounter  Procedures   EKG 12-Lead   Meds ordered this encounter  Medications   metoprolol succinate (TOPROL XL) 50 MG 24 hr tablet    Sig: Take 1 tablet (50 mg total) by mouth [name].    Dispense:  90 tablet    Refill:  3    Dose change new Rx    Patient Instructions  Medication Instructions:   INCREASE Metoprolol Succinate to 50 mg [name]   *If you need a refill on your cardiac medications before your next appointment, please call your pharmacy*  Lab Work: NONE ordered at this time of appointment   If you have labs (blood work) drawn today and your tests are completely normal, you will receive your results only by: MyChart Message (if you have MyChart) OR A paper copy in the mail If you have any lab test that is abnormal or we need to change your treatment, we will call you to review the results.  Testing/Procedures: NONE ordered at this time of appointment   Follow-Up: At [facility], you and your health needs are our priority.  As part of our continuing mission to provide you with exceptional heart care, we have created designated Provider Care Teams.  These Care Teams include your primary Cardiologist (physician) and Advanced Practice Providers (APPs -  Physician Assistants and Nurse Practitioners) who all work together to provide you with the care you need, when you need it.   Your next appointment:   Within 6 week(s)  Provider:  Dr. Gala Romney     Other Instructions     Signed, Azalee Course, PA  12/10/2022 8:33 AM    [facility]

## 2022-12-09 NOTE — Progress Notes (Signed)
[name] ([id number]) [id number] Page 1 of 10 Visit Report for 12/08/2022 Arrival Information Details Patient Name: Date of Service: [name] [facility] [name]. 12/08/2022 9:30 [name]: [id number] Patient Account Number: [id number] Date of Birth/Sex: Treating RN: 12/22/1950 (72 y.o. M) Primary Care Kathe Wirick: [name] Other Clinician: Referring Keoki Mchargue: Treating Zhane Bluitt/Extender: [name] Ngetich, [name] in Treatment: 20 Visit Information History Since Last Visit Added or deleted any medications: No Patient Arrived: Crutches Any new allergies or adverse reactions: No Arrival Time: 09:45 Had [name] fall or experienced change in No Accompanied By: daughter activities of daily living that may affect Transfer Assistance: None risk of falls: Patient Identification Verified: Yes Signs or symptoms of abuse/neglect since last visito No Secondary Verification Process Completed: Yes Hospitalized since last visit: No Patient Requires Transmission-Based Precautions: No Implantable device outside of the clinic excluding No Patient Has Alerts: Yes cellular tissue based products placed in the center Patient Alerts: Patient on Blood Thinner since last visit: Has Dressing in Place as Prescribed: Yes Has Compression in Place as Prescribed: Yes Pain Present Now: No Electronic Signature(s) Signed: 12/08/2022 3:52:19 PM By: Erenest Blank Entered By: Erenest Blank on 12/08/2022 09:46:15 -------------------------------------------------------------------------------- Clinic Level of Care Assessment Details Patient Name: Date of Service: [name] [facility] [name]. 12/08/2022 9:30 [name]: [id number] Patient Account Number: [id number] Date of Birth/Sex: Treating RN: February 15, 1951 (72 y.o. [name] Primary Care Nickol Collister: [name] Other Clinician: Referring Wanza Szumski: Treating Basia Mcginty/Extender: [name] Ngetich, [name] in Treatment: 20 Clinic Level of Care Assessment Items TOOL 4 Quantity Score X- 1 0 Use when only an EandM is performed on FOLLOW-UP visit ASSESSMENTS - Nursing Assessment / Reassessment X- 1 10 Reassessment of Co-morbidities (includes updates in patient status) X- 1 5 Reassessment of Adherence to Treatment Plan ASSESSMENTS - Wound and Skin [name] ssessment / Reassessment X - Simple Wound Assessment / Reassessment - one wound 1 5 []  - 0 Complex Wound Assessment / Reassessment - multiple wounds []  - 0 Dermatologic / Skin Assessment (not related to wound area) ASSESSMENTS - Focused Assessment X- 1 5 Circumferential Edema Measurements - multi extremities []  - 0 Nutritional Assessment / Counseling / Intervention [name] [name] ([id number]) [id number] Page 2 of 10 []  - 0 Lower Extremity Assessment (monofilament, tuning fork, pulses) []  - 0 Peripheral Arterial Disease Assessment (using hand held doppler) ASSESSMENTS - Ostomy and/or Continence Assessment and Care []  - 0 Incontinence Assessment and Management []  - 0 Ostomy Care Assessment and Management (repouching, etc.) PROCESS - Coordination of Care []  - 0 Simple Patient / Family Education for ongoing care X- 1 20 Complex (extensive) Patient / Family Education for ongoing care X- 1 10 Staff obtains Programmer, systems, Records, T Results / Process Orders est []  - 0 Staff telephones HHA, Nursing Homes / Clarify orders / etc []  - 0 Routine Transfer to another Facility (non-emergent condition) []  - 0 Routine Hospital Admission (non-emergent condition) []  - 0 New Admissions / Biomedical engineer / Ordering NPWT Apligraf, etc. , []  - 0 Emergency Hospital Admission (emergent condition) X- 1 10 Simple Discharge Coordination []  - 0 Complex (extensive) Discharge Coordination PROCESS - Special Needs []  - 0 Pediatric / Minor Patient Management []  - 0 Isolation Patient  Management []  - 0 Hearing / Language / Visual special needs []  - 0 Assessment of Community assistance (transportation, D/C planning, etc.) []  - 0 Additional assistance / Altered mentation []  - 0 Support Surface(s) Assessment (bed,  cushion, seat, etc.) INTERVENTIONS - Wound Cleansing / Measurement X - Simple Wound Cleansing - one wound 1 5 []  - 0 Complex Wound Cleansing - multiple wounds X- 1 5 Wound Imaging (photographs - any number of wounds) []  - 0 Wound Tracing (instead of photographs) X- 1 5 Simple Wound Measurement - one wound []  - 0 Complex Wound Measurement - multiple wounds INTERVENTIONS - Wound Dressings X - Small Wound Dressing one or multiple wounds 1 10 []  - 0 Medium Wound Dressing one or multiple wounds []  - 0 Large Wound Dressing one or multiple wounds X- 1 5 Application of Medications - topical []  - 0 Application of Medications - injection INTERVENTIONS - Miscellaneous []  - 0 External ear exam []  - 0 Specimen Collection (cultures, biopsies, blood, body fluids, etc.) []  - 0 Specimen(s) / Culture(s) sent or taken to Lab for analysis []  - 0 Patient Transfer (multiple staff / / Similar devices) []  - 0 Simple Staple / Suture removal (25 or less) []  - 0 Complex Staple / Suture removal (26 or more) []  - 0 Hypo / Hyperglycemic Management (close monitor of Blood Glucose) NURI, LARMER [name] (  .pdf Page 3 of 10 []  - 0 Ankle / Brachial Index (ABI) - do not check if billed separately X- 1 5 Vital Signs Has the patient been seen at the hospital within the last three years: Yes Total Score: 100 Level Of Care: New/Established - Level 3 Electronic Signature(s) Signed: 12/09/2022 8:39:24 AM By: RN Entered By: on 12/08/2022 15:59:20 -------------------------------------------------------------------------------- Encounter Discharge Information Details Patient Name: Date of  Service: [name]. 12/08/2022 9:30 [name]: Nurse, adult Patient Account Number: Date of Birth/Sex: Treating RN: Apr 02, 1951 (72 y.o. [name] Primary Care Analiya Porco: [id number] Other Clinician: Referring Tonga Prout: Treating Adis Sturgill/Extender: ) [id number] Ngetich, [name] in Treatment: 20 Encounter Discharge Information Items Post Procedure Vitals Discharge Condition: Stable Temperature (F): 98.7 Ambulatory Status: Ambulatory Pulse (bpm): 74 Discharge Destination: Home Respiratory Rate (breaths/min): 17 Transportation: Private Auto Blood Pressure (mmHg): 120/80 Accompanied By: daughter Schedule Follow-up Appointment: Yes Clinical Summary of Care: Patient Declined Electronic Signature(s) Signed: 12/09/2022 8:39:24 AM By: 12/11/2022 RN Entered By: Fonnie Mu on 12/08/2022 16:00:02 -------------------------------------------------------------------------------- Lower Extremity Assessment Details Patient Name: Date of Service: 12/10/2022 [facility] [name]. 12/08/2022 9:30 [name]: [id number] Patient Account Number: [id number] Date of Birth/Sex: Treating RN: 1951-02-28 (72 y.o. M) Primary Care Berdina Cheever: [name] Other Clinician: Referring Julyssa Kyer: Treating Cordney Barstow/Extender: Richarda Blade Ngetich, [name] in Treatment: 20 Edema Assessment Assessed: [Left: No] [Right: No] Edema: [Left: No] [Right: No] Calf Left: Right: Point of Measurement: 39 cm From Medial Instep 42 cm 39 cm Ankle Left: Right: Point of Measurement: 8 cm From Medial Instep 24.4 cm 26 cm Electronic Signature(s) ANDIE, MORTIMER [name] (12/11/2022) [id number].pdf Page 4 of 10 Signed: 12/08/2022 3:52:19 PM By: 12/10/2022 Entered By: [name] on 12/08/2022 10:02:18 -------------------------------------------------------------------------------- Multi Wound Chart Details Patient Name:  Date of Service: 12/10/2022 [id number] [facility] [name]. 12/08/2022 9:30 [name]: [id number] Patient Account Number: Richarda Blade Date of Birth/Sex: Treating RN: Dec 02, 1950 (72 y.o. M) Primary Care Ysidra Sopher: [id number] Other Clinician: Referring Emillia Weatherly: Treating Dorothie Wah/Extender: [id number] Ngetich, [name] in Treatment: 20 Vital Signs Height(in): 68 Pulse(bpm): 85 Weight(lbs): 185 Blood Pressure(mmHg): 149/82 Body Mass Index(BMI): 28.1 Temperature(F): Respiratory Rate(breaths/min): 18 [1:Photos:] [N/[name]:N/[name]] Right Achilles Left, Plantar Foot N/[name] Wound Location: Surgical Injury Gradually  Appeared N/[name] Wounding Event: Diabetic Wound/Ulcer of the Lower Diabetic Wound/Ulcer of the Lower N/[name] Primary Etiology: Extremity Extremity Sleep Apnea, Arrhythmia, Congestive Sleep Apnea, Arrhythmia, Congestive N/[name] Comorbid History: Heart Failure, Hypertension, Peripheral Heart Failure, Hypertension, Peripheral Arterial Disease, Type II Diabetes Arterial Disease, Type II Diabetes 07/16/2022 03/09/2022 N/[name] Date Acquired: 20 20 N/[name] Weeks of Treatment: Open Open N/[name] Wound Status: No No N/[name] Wound Recurrence: 0.5x0.3x0.1 0.4x0.9x0.4 N/[name] Measurements L x W x D (cm) 0.118 0.283 N/[name] [name] (cm) : rea 0.012 0.113 N/[name] Volume (cm) : 99.40% -28.60% N/[name] % Reduction in [name] rea: 99.90% -71.20% N/[name] % Reduction in Volume: Grade 2 Grade 3 N/[name] Classification: None Present Medium N/[name] Exudate [name] mount: N/[name] Serosanguineous N/[name] Exudate Type: N/[name] red, brown N/[name] Exudate Color: Distinct, outline attached Distinct, outline attached N/[name] Wound Margin: Large (67-100%) Large (67-100%) N/[name] Granulation [name] mount: N/[name] Pink, Friable N/[name] Granulation Quality: None Present (0%) None Present (0%) N/[name] Necrotic [name] mount: Fat Layer (Subcutaneous Tissue): Yes Fat Layer (Subcutaneous Tissue): Yes N/[name] Exposed Structures: Fascia: No Bone: Yes Tendon: No Fascia: No Muscle: No Tendon: No Joint: No Muscle:  No Bone: No Joint: No Large (67-100%) Small (1-33%) N/[name] Epithelialization: Debridement - Excisional N/[name] N/[name] Debridement: Pre-procedure Verification/Time Out 10:22 N/[name] N/[name] Taken: Lidocaine N/[name] N/[name] Pain Control: Subcutaneous, Slough N/[name] N/[name] Tissue Debrided: Skin/Subcutaneous Tissue N/[name] N/[name] Level: 0.15 N/[name] N/[name] Debridement [name] (sq cm): rea Curette N/[name] N/[name] Instrument: Minimum N/[name] N/[name] Bleeding: Pressure N/[name] N/[name] Hemostasis [name] chieved: 0 N/[name] N/[name] Procedural Pain: NESTER, BACHUS [name] ([id number]) [phone].pdf Page 5 of 10 0 N/[name] N/[name] Post Procedural Pain: Procedure was tolerated well N/[name] N/[name] Debridement Treatment Response: 0.5x0.3x0.1 N/[name] N/[name] Post Debridement Measurements L x W x D (cm) 0.012 N/[name] N/[name] Post Debridement Volume: (cm) Excoriation: No Callus: Yes N/[name] Periwound Skin Texture: Induration: No Excoriation: No Callus: No Induration: No Crepitus: No Crepitus: No Rash: No Rash: No Scarring: No Scarring: No Dry/Scaly: Yes Dry/Scaly: Yes N/[name] Periwound Skin Moisture: Maceration: No Maceration: No Atrophie Blanche: No Atrophie Blanche: No N/[name] Periwound Skin Color: Cyanosis: No Cyanosis: No Ecchymosis: No Ecchymosis: No Erythema: No Erythema: No Hemosiderin Staining: No Hemosiderin Staining: No Mottled: No Mottled: No Pallor: No Pallor: No Rubor: No Rubor: No Cellular or Tissue Based Product N/[name] N/[name] Procedures Performed: Debridement Treatment Notes Electronic Signature(s) Signed: 12/08/2022 11:41:42 AM By: [name] DO Entered By: [name] on 12/08/2022 10:35:50 -------------------------------------------------------------------------------- Multi-Disciplinary Care Plan Details Patient Name: Date of Service: [name] [facility] [name]. 12/08/2022 9:30 [name]: [id number] Patient Account Number: [id number] Date of Birth/Sex: Treating RN: 04-12-1951 (72 y.o. [name] Primary Care Chenoa Luddy:  [name] Other Clinician: Referring Jossette Zirbel: Treating Melissia Lahman/Extender: [name] Ngetich, [name] in Treatment: 20 Active Inactive Nutrition Nursing Diagnoses: Potential for alteratiion in Nutrition/Potential for imbalanced nutrition Goals: Patient/caregiver agrees to and verbalizes understanding of need to use nutritional supplements and/or vitamins as prescribed Date Initiated: 07/16/2022 Target Resolution Date: 01/08/2023 Goal Status: Active Patient/caregiver will maintain therapeutic glucose control Date Initiated: 07/16/2022 Target Resolution Date: 01/08/2023 Goal Status: Active Interventions: Assess HgA1c results as ordered upon admission and as needed Provide education on nutrition Treatment Activities: Education provided on Nutrition : 10/19/2022 Obtain HgA1c : 07/16/2022 Patient referred to Primary Care Physician for further nutritional evaluation : 07/16/2022 Notes: Pain, Acute or Chronic Nursing Diagnoses: TAISEI, BONNETTE ([id number]) [phone].pdf Page 6 of 10 Pain, acute or chronic: actual or potential Potential alteration in comfort, pain Goals: Patient will verbalize adequate pain  control and receive pain control interventions during procedures as needed Date Initiated: 07/16/2022 Target Resolution Date: 01/08/2023 Goal Status: Active Interventions: Encourage patient to take pain medications as prescribed Provide education on pain management Reposition patient for comfort Treatment Activities: Administer pain control measures as ordered : 07/16/2022 Notes: Electronic Signature(s) Signed: 12/09/2022 8:39:24 AM By: Fonnie Mu RN Entered By: Fonnie Mu on 12/08/2022 10:11:59 -------------------------------------------------------------------------------- Pain Assessment Details Patient Name: Date of Service: [name] [name]. 12/08/2022 9:30 [name]: [id number] Patient Account Number:  [id number] Date of Birth/Sex: Treating RN: 13-Nov-1950 (72 y.o. M) Primary Care Biviana Saddler: Richarda Blade Other Clinician: Referring Lineth Thielke: Treating Dayshawn Irizarry/Extender: Geralyn Corwin Ngetich, [name] in Treatment: 20 Active Problems Location of Pain Severity and Description of Pain Patient Has Paino No Site Locations Pain Management and Medication Current Pain Management: Electronic Signature(s) Signed: 12/08/2022 3:52:19 PM By: Thayer Dallas Entered By: Thayer Dallas on 12/08/2022 09:46:46 [name] ([id number]) [id number] Page 7 of 10 -------------------------------------------------------------------------------- Patient/Caregiver Education Details Patient Name: Date of Service: [name] [facility] [name]. 1/30/2024andnbsp9:30 [name]: [id number] Patient Account Number: [id number] Date of Birth/Gender: Treating RN: [birth year] (72 y.o. [name] Primary Care Physician: Richarda Blade Other Clinician: Referring Physician: Treating Physician/Extender: Jae Dire in Treatment: 20 Education Assessment Education Provided To: Patient Education Topics Provided Wound/Skin Impairment: Methods: Explain/Verbal Responses: Reinforcements needed, State content correctly Electronic Signature(s) Signed: 12/09/2022 8:39:24 AM By: Fonnie Mu RN Entered By: Fonnie Mu on 12/08/2022 10:12:21 -------------------------------------------------------------------------------- Wound Assessment Details Patient Name: Date of Service: [name] [facility] [name]. 12/08/2022 9:30 [name]: [id number] Patient Account Number: [id number] Date of Birth/Sex: Treating RN: 27-Oct-1951 (72 y.o. M) Primary Care Abem Shaddix: Richarda Blade Other Clinician: Referring Oluwatobi Visser: Treating Sakib Noguez/Extender: Geralyn Corwin Ngetich, [name] in Treatment: 20 Wound Status Wound Number: 1 Primary  Diabetic Wound/Ulcer of the Lower Extremity Etiology: Wound Location: Right Achilles Wound Open Wounding Event: Surgical Injury Status: Date Acquired: 07/16/2022 Comorbid Sleep Apnea, Arrhythmia, Congestive Heart Failure, Hypertension, Weeks Of Treatment: 20 History: Peripheral Arterial Disease, Type II Diabetes Clustered Wound: No Photos Wound Measurements Length: (cm) 0 Width: (cm) 0 Depth: (cm) 0 Area: (cm) Volume: (cm) Fath, Rachit [name] ([id number]) .5 % Reduction in Area: 99.4% .3 % Reduction in Volume: 99.9% .1 Epithelialization: Large (67-100%) 0.118 Tunneling: No 0.012 Undermining: No [id number] Page 8 of 10 Wound Description Classification: Grade 2 Wound Margin: Distinct, outline attached Exudate Amount: None Present Foul Odor After Cleansing: No Slough/Fibrino No Wound Bed Granulation Amount: Large (67-100%) Exposed Structure Necrotic Amount: None Present (0%) Fascia Exposed: No Fat Layer (Subcutaneous Tissue) Exposed: Yes Tendon Exposed: No Muscle Exposed: No Joint Exposed: No Bone Exposed: No Periwound Skin Texture Texture Color No Abnormalities Noted: No No Abnormalities Noted: No Callus: No Atrophie Blanche: No Crepitus: No Cyanosis: No Excoriation: No Ecchymosis: No Induration: No Erythema: No Rash: No Hemosiderin Staining: No Scarring: No Mottled: No Pallor: No Moisture Rubor: No No Abnormalities Noted: No Dry / Scaly: Yes Maceration: No Treatment Notes Wound #1 (Achilles) Wound Laterality: Right Cleanser Soap and Water Discharge Instruction: May shower and wash wound with dial antibacterial soap and water prior to dressing change. Wound Cleanser Discharge Instruction: Cleanse the wound with wound cleanser prior to applying [name] clean dressing using gauze sponges, not tissue or cotton balls. Peri-Wound Care Sween Lotion (Moisturizing lotion) Discharge Instruction: Apply moisturizing lotion as  directed Topical Primary Dressing Grafix #9 Discharge Instruction: applied by Jaquavian Firkus.  adpatic and steri-strips Discharge Instruction: to secure grafix in place. do not remove. Secondary Dressing ABD Pad, 5x9 Discharge Instruction: Apply over primary dressing as directed. Woven Gauze Sponge, Non-Sterile 4x4 in Discharge Instruction: Apply over primary dressing as directed. Secured With Compression Wrap Kerlix Roll 4.5x3.1 (in/yd) Discharge Instruction: Apply Kerlix and Coban compression as directed. Coban Self-Adherent Wrap 4x5 (in/yd) Discharge Instruction: Apply over Kerlix as directed. Compression Stockings Add-Ons Electronic Signature(s) Signed: 12/08/2022 3:52:19 PM By: Thayer Dallas Entered By: Thayer Dallas on 12/08/2022 10:07:42 [name] ([id number]) [id number] Page 9 of 10 -------------------------------------------------------------------------------- Wound Assessment Details Patient Name: Date of Service: [name] [facility] [name]. 12/08/2022 9:30 [name]: [id number] Patient Account Number: [id number] Date of Birth/Sex: Treating RN: 07-Dec-1950 (72 y.o. M) Primary Care Eduarda Scrivens: Richarda Blade Other Clinician: Referring Jeferson Boozer: Treating Delita Chiquito/Extender: Geralyn Corwin Ngetich, [name] in Treatment: 20 Wound Status Wound Number: 2 Primary Diabetic Wound/Ulcer of the Lower Extremity Etiology: Wound Location: Left, Plantar Foot Wound Open Wounding Event: Gradually Appeared Status: Date Acquired: 03/09/2022 Comorbid Sleep Apnea, Arrhythmia, Congestive Heart Failure, Hypertension, Weeks Of Treatment: 20 History: Peripheral Arterial Disease, Type II Diabetes Clustered Wound: No Photos Wound Measurements Length: (cm) 0 Width: (cm) 0 Depth: (cm) 0 Area: (cm) Volume: (cm) .4 % Reduction in Area: -28.6% .9 % Reduction in Volume: -71.2% .4 Epithelialization: Small (1-33%) 0.283 Tunneling: No 0.113  Undermining: No Wound Description Classification: Grade 3 Wound Margin: Distinct, outline attached Exudate Amount: Medium Exudate Type: Serosanguineous Exudate Color: red, brown Foul Odor After Cleansing: No Slough/Fibrino No Wound Bed Granulation Amount: Large (67-100%) Exposed Structure Granulation Quality: Pink, Friable Fascia Exposed: No Necrotic Amount: None Present (0%) Fat Layer (Subcutaneous Tissue) Exposed: Yes Tendon Exposed: No Muscle Exposed: No Joint Exposed: No Bone Exposed: Yes Periwound Skin Texture Texture Color No Abnormalities Noted: No No Abnormalities Noted: No Callus: Yes Atrophie Blanche: No Crepitus: No Cyanosis: No Excoriation: No Ecchymosis: No Induration: No Erythema: No Rash: No Hemosiderin Staining: No Scarring: No Mottled: No Pallor: No Moisture Rubor: No No Abnormalities Noted: No Dry / ScalyFUE, CERVENKA [name] ([id number]) [id number] Page 10 of 10 Maceration: No Treatment Notes Wound #2 (Foot) Wound Laterality: Plantar, Left Cleanser Wound Cleanser Discharge Instruction: Cleanse the wound with wound cleanser prior to applying [name] clean dressing using gauze sponges, not tissue or cotton balls. Peri-Wound Care Skin Prep Discharge Instruction: Use skin prep as directed Topical Primary Dressing Dakin's Solution 0.25%, 16 (oz) Discharge Instruction: Moisten gauze with Dakin's solution Secondary Dressing Optifoam Non-Adhesive Dressing, 4x4 in Discharge Instruction: Apply over primary dressing as directed. Woven Gauze Sponge, Non-Sterile 4x4 in Discharge Instruction: Apply over primary dressing as directed. Woven Gauze Sponges 2x2 in Discharge Instruction: Apply over primary dressing as directed. Secured With Conforming Stretch Gauze Bandage, Sterile 2x75 (in/in) Discharge Instruction: Secure with stretch gauze as directed. 55M Medipore H Soft Cloth Surgical T ape, 4 x 10 (in/yd) Discharge  Instruction: Secure with tape as directed. Compression Wrap Compression Stockings Add-Ons Electronic Signature(s) Signed: 12/08/2022 3:52:19 PM By: Thayer Dallas Entered By: Thayer Dallas on 12/08/2022 10:06:46 -------------------------------------------------------------------------------- Vitals Details Patient Name: Date of Service: [name] [facility] [name]. 12/08/2022 9:30 [name]: [id number] Patient Account Number: [id number] Date of Birth/Sex: Treating RN: 05-12-1951 (72 y.o. M) Primary Care Shamir Tuzzolino: Richarda Blade Other Clinician: Referring Laural Eiland: Treating Kehinde Totzke/Extender: Geralyn Corwin Ngetich, [name] in Treatment: 20 Vital Signs Time Taken: 09:46 Pulse (bpm): 85 Height (in): 68 Respiratory Rate (breaths/min): 18 Weight (lbs):  185 Blood Pressure (mmHg): 149/82 Body Mass Index (BMI): 28.1 Reference Range: 80 - 120 mg / dl Electronic Signature(s) Signed: 12/08/2022 3:52:19 PM By: Erenest Blank Entered By: Erenest Blank on 12/08/2022 09:46:38

## 2022-12-09 NOTE — Patient Instructions (Addendum)
Medication Instructions:   INCREASE Metoprolol Succinate to 50 mg daily   *If you need a refill on your cardiac medications before your next appointment, please call your pharmacy*  Lab Work: NONE ordered at this time of appointment   If you have labs (blood work) drawn today and your tests are completely normal, you will receive your results only by: [place] (if you have MyChart) OR A paper copy in the mail If you have any lab test that is abnormal or we need to change your treatment, we will call you to review the results.  Testing/Procedures: NONE ordered at this time of appointment   Follow-Up: At [facility], you and your health needs are our priority.  As part of our continuing mission to provide you with exceptional heart care, we have created designated Provider Care Teams.  These Care Teams include your primary Cardiologist (physician) and Advanced Practice Providers (APPs -  Physician Assistants and Nurse Practitioners) who all work together to provide you with the care you need, when you need it.   Your next appointment:   Within 6 week(s)  Provider:   Dr. Haroldine Laws     Other Instructions

## 2022-12-09 NOTE — Progress Notes (Signed)
ENDER, RORKE A ([id number]) [id number] Page 1 of 12 Visit Report for 12/08/2022 Chief Complaint Document Details Patient Name: Date of Service: [name] [facility] A. 12/08/2022 9:30 A M Medical Record Number: [id number] Patient Account Number: [id number] Date of Birth/Sex: Treating RN: 1951/03/31 (72 y.o. M) Primary Care Provider: Richarda Blade Other Clinician: Referring Provider: Treating Provider/Extender: Velna Ochs, [name] in Treatment: 20 Information Obtained from: Patient Chief Complaint 07/16/2022; bilateral lower extremity wounds Electronic Signature(s) Signed: 12/08/2022 11:41:42 AM By: Geralyn Corwin DO Entered By: Geralyn Corwin on 12/08/2022 10:35:58 -------------------------------------------------------------------------------- Cellular or Tissue Based Product Details Patient Name: Date of Service: [name] [facility] A. 12/08/2022 9:30 A M Medical Record Number: [id number] Patient Account Number: [id number] Date of Birth/Sex: Treating RN: Jan 08, 1951 (72 y.o. [name] Primary Care Provider: Richarda Blade Other Clinician: Referring Provider: Treating Provider/Extender: Jae Dire in Treatment: 20 Cellular or Tissue Based Product Type Wound #1 Right Achilles Applied to: Performed By: Physician Geralyn Corwin, DO Cellular or Tissue Based Product Type: Grafix prime Level of Consciousness (Pre-procedure): Awake and Alert Pre-procedure Verification/Time Out Yes - 10:25 Taken: Location: trunk / arms / legs Wound Size (sq cm): 0.15 Product Size (sq cm): 6 Waste Size (sq cm): 0 Amount of Product Applied (sq cm): 6 Instrument Used: Forceps, Scissors Lot #: [id number] Expiration Date: 03/23/2024 Fenestrated: No Reconstituted: Yes Solution Type: NS Solution Amount: 65ml Lot #: [id number] Solution Expiration Date: 03/08/2025 Secured: Yes Secured With: Steri-Strips Dressing  Applied: Yes Primary Dressing: adaptic Procedural Pain: 0 Post Procedural Pain: 0 Response to Treatment: Procedure was tolerated well Level of Consciousness (Post- Awake and Alert procedure): ABOU, STERKEL A ([id number]) [id number] Page 2 of 12 Post Procedure Diagnosis Same as Pre-procedure Electronic Signature(s) Signed: 12/08/2022 11:41:42 AM By: Geralyn Corwin DO Signed: 12/09/2022 8:39:24 AM By: Fonnie Mu RN Entered By: Fonnie Mu on 12/08/2022 10:24:08 -------------------------------------------------------------------------------- Debridement Details Patient Name: Date of Service: [name] [facility] A. 12/08/2022 9:30 A M Medical Record Number: [id number] Patient Account Number: [id number] Date of Birth/Sex: Treating RN: 12-02-1950 (72 y.o. [name], Lauren Primary Care Provider: Richarda Blade Other Clinician: Referring Provider: Treating Provider/Extender: Geralyn Corwin Ngetich, [name] in Treatment: 20 Debridement Performed for Assessment: Wound #1 Right Achilles Performed By: Physician Geralyn Corwin, DO Debridement Type: Debridement Severity of Tissue Pre Debridement: Fat layer exposed Level of Consciousness (Pre-procedure): Awake and Alert Pre-procedure Verification/Time Out Yes - 10:22 Taken: Start Time: 10:22 Pain Control: Lidocaine T Area Debrided (L x W): otal 0.5 (cm) x 0.3 (cm) = 0.15 (cm) Tissue and other material debrided: Viable, Non-Viable, Slough, Subcutaneous, Slough Level: Skin/Subcutaneous Tissue Debridement Description: Excisional Instrument: Curette Bleeding: Minimum Hemostasis Achieved: Pressure End Time: 10:22 Procedural Pain: 0 Post Procedural Pain: 0 Response to Treatment: Procedure was tolerated well Level of Consciousness (Post- Awake and Alert procedure): Post Debridement Measurements of Total Wound Length: (cm) 0.5 Width: (cm) 0.3 Depth: (cm) 0.1 Volume: (cm)  0.012 Character of Wound/Ulcer Post Debridement: Improved Severity of Tissue Post Debridement: Fat layer exposed Post Procedure Diagnosis Same as Pre-procedure Electronic Signature(s) Signed: 12/08/2022 11:41:42 AM By: Geralyn Corwin DO Signed: 12/09/2022 8:39:24 AM By: Fonnie Mu RN Entered By: Fonnie Mu on 12/08/2022 10:22:20 HPI Details -------------------------------------------------------------------------------- [name] ([id number]) [id number] Page 3 of 12 Patient Name: Date of Service: [name] [facility] A. 12/08/2022 9:30 A M Medical Record Number: [id number] Patient Account Number: [id number] Date of Birth/Sex: Treating RN: 03-Mar-1951 (72 y.o. M) Primary Care Provider:  Ngetich, Dinah Other Clinician: Referring Provider: Treating Provider/Extender: [name] Ngetich, [name] in Treatment: 20 History of Present Illness HPI Description: Admission 07/16/2022 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 He is not sure how the wounds started. He has been following with podiatry for this issue. He had removal of the tendon to the right lower extremity by Dr. Blenda Mounts. He has been using Betadine to the wound beds. On 06/11/2022 he had a right posterior tibial artery angioplasty by Dr. Carlis Abbott. It was reported the patient is optimized after revascularization of the right lower extremity. His ABIs on the left were 0.91. He currently denies systemic signs of infection. He also does not wear shoes. He came in with Kerlix wrap to his feet bilaterally. This did not  completely cover his feet. 07/23/2022: This is a patient of Dr. Jodene Nam that she asked me to take a look at last week due to the significant involvement of the muscle and tendon on his right posterior leg wound. He needed an aggressive debridement and asked me if I would be able to perform this on her behalf. The patient is here today for that procedure. When his dressing was removed in clinic today, the wound was teeming with maggots. There is necrotic muscle, tendon, and fat, with a thick layer of slough on the wound. 9/21; patient presents for follow-up. He was debrided by Dr. Celine Ahr at last clinic visit without any issues. He has been placing Dakin's wet-to-dry dressings to the right posterior wound. He has been using Medihoney to the left foot wound. He reports improvement in wound healing. He has no issues or complaints today. 9/28; patient with type 2 diabetes PAD status post revascularization. He underwent retrograde right PTA angioplasty. Last saw Dr. Carlis Abbott on 9/12 and was felt to have a brisk posterior tibial signal at the right ankle. He had a pulsatile toe tracing that appeared adequate for healing. He has been using Santyl and Hydrofera Blue on the right Achilles area. He has a smaller area on the left fifth plantar metatarsal head They were apparently in the ER on 9/23 with swelling and discoloration above the wrap. They have a picture of the leg after the wrap was taken off which looks like that it was excessively tight superiorly 10/6; patient presents for follow-up. We have been using Santyl and Hydrofera Blue to the right lower extremity wound under Kerlix/Coban. T the left foot we o have been using silver alginate with Medihoney. He again comes in with no shoes. He has no issues or complaints today. 10/12; patient presents for follow-up. We have been using Santyl and Hydrofera Blue to the right lower extremity under Kerlix/Coban And silver alginate to the left foot wound. He has  no issues today. He denies signs of infection. 10/19; patient presents for follow-up. We continue to use Santyl and Hydrofera Blue to the right lower extremity under Kerlix/Coban and silver alginate to the left foot wound. There is been improvement in wound healing. 10/26; patient presents for follow-up. We have been using Santyl and Hydrofera Blue to the right lower extremity under Kerlix/Coban and silver alginate to the left foot. There continues to  be improvement in wound healing. Patient has no issues or complaints today. 11/2; the patient's area on the right Achilles heel looks quite healthy and is improved in terms of measurements. We have been using Hydrofera Blue. He is approved for Grafix On the left plantar foot wound over the fifth metatarsal head this tunnels over the lateral part of the met head. He is not offloading this at all 11/16; patient presents for follow-up. He has been using a surgical shoe with an offloading felt pad to the left lateral foot along with silver alginate. He has been approved for Grafix and this is available for placement today T the right lower extremity wound. Patient Is agreeable to this. We have been using Santyl and o Hydrofera Blue under Kerlix/Coban to this area. 11/21; patient presents for follow-up. We have been doing Grafix to the right lower extremity and silver alginate to the left foot wound. He has no issues or complaints today. 11/30; patient presents for follow-up. We have been doing Grafix to the right lower extremity wound and Medihoney with Hydrofera Blue to the left lateral foot wound. He reports pain to the left foot wound today. He did not obtain his x-ray. 12/11; patient presents for follow-up. He has been using Dakin's wet-to-dry dressings to the left plantar foot wound. We have been doing Grafix to the right lower extremity wound under compression therapy. He obtained his x-ray that showed mild periosteal elevation at the resection site  with the possibility of osteomyelitis. He currently denies systemic signs of infection. He has been taking doxycycline and Augmentin without issues. He is getting his INR checked by his primary care office. 12/18; patient presents for follow-up. He has been using Dakin's wet-to-dry dressings to the left foot wound. We have been placing Grafix under compression therapy to the right lower extremity. He is currently taking doxycycline and Augmentin. He denies signs of infection. 12/26; patient presents for follow-up. He has been using Dakin's wet-to-dry dressings to the left foot. He has been off oral antibiotics for potential bone biopsy. We have been placing Grafix under compression therapy to the right lower extremity. There has been improvement in wound healing to both sites. He states he is trying to aggressively offload the left foot. He currently denies signs of infection. 1/2; patient presents for follow-up. He has been doing Dakin's wet-to-dry dressings to the left foot. He saw Dr. Ralene Cork today and had a bone biopsy. Debridement was performed on the site. Dressing in place with Dakin's wet-to-dry. T the right leg we have been using Grafix under compression therapy. He o has no issues or complaints today. 1/9; patient presents for follow-up. We have been using Hydrofera Blue under compression therapy to the right lower extremity wound. This is well-healing. He has been using Dakin's wet-to-dry dressings to the left lateral foot wound. He has been taking ciprofloxacin due to culture results without issues. He was referred to infectious disease by podiatry. He does not have an appointment yet. 1/16; patient presents for follow-up. We were using collagen under Kerlix/Coban to the right lower extremity wound. He has been approved for Grafix and this was available for placement today. He continues to use Dakin's wet-to-dry dressings daily to the left foot. He started levofloxacin. He has an  appointment with infectious disease on 1/24. 1/23; patient presents for follow-up. Grafix was placed in standard fashion to the right lower extremity wound last week. He has been using Dakin's wet-to-dry dressings to the left foot. He has been  taking levofloxacin. He has an infectious disease appointment tomorrow as well as a pulmonology appointment. He followed up with vein and vascular on 1/16 and plan is for angiogram of the left lower extremity. He has a history of left peroneal angioplasty in 2022. ALEKSANDER, EDMISTON A ([id number]) [id number] Page 4 of 12 1/30; patient presents for follow-up. Grafix was placed in standard fashion to the right lower extremity wound last week. There has been improvement in healing here. He has been using Dakin's wet-to-dry dressings to the left foot. Patient saw infectious disease, Dr. Earlene Plater on 1/24 and plan is for PICC line on 2/5 with cefepime. For now he is taking levofloxacin. He has been cleared by pulmonology for HBO. Due to extensive heart history we have asked HBO clearance from his cardiologist as well. He has follow-up with them tomorrow. He currently has no issues or complaints today. Electronic Signature(s) Signed: 12/08/2022 11:41:42 AM By: Geralyn Corwin DO Entered By: Geralyn Corwin on 12/08/2022 10:38:48 -------------------------------------------------------------------------------- Physical Exam Details Patient Name: Date of Service: [name] [facility] A. 12/08/2022 9:30 A M Medical Record Number: [id number] Patient Account Number: [id number] Date of Birth/Sex: Treating RN: 08-09-1951 (72 y.o. M) Primary Care Provider: Richarda Blade Other Clinician: Referring Provider: Treating Provider/Extender: Geralyn Corwin Ngetich, [name] in Treatment: 20 Constitutional respirations regular, non-labored and within target range for patient.. Cardiovascular 2+ dorsalis pedis/posterior tibialis  pulses. Psychiatric pleasant and cooperative. Notes Right lower extremity: T the posterior aspect there is an open wound with granulation tissue and nonviable tissue. Venous stasis dermatitis. T the left lower o o extremity there is An open wound that undermines and probes to bone. No surrounding signs of soft tissue infection. Electronic Signature(s) Signed: 12/08/2022 11:41:42 AM By: Geralyn Corwin DO Entered By: Geralyn Corwin on 12/08/2022 10:39:12 -------------------------------------------------------------------------------- Physician Orders Details Patient Name: Date of Service: [name] [name] [facility] A. 12/08/2022 9:30 A M Medical Record Number: [id number] Patient Account Number: [id number] Date of Birth/Sex: Treating RN: 1951/07/16 (72 y.o. [name] Primary Care Provider: Richarda Blade Other Clinician: Referring Provider: Treating Provider/Extender: Geralyn Corwin Ngetich, [name] in Treatment: 20 Verbal / Phone Orders: No Diagnosis Coding Follow-up Appointments ppointment in 1 week. - w/ Dr. Mikey Bussing Tuesday 12/15/22 @ 1:30 Rm # 7 Return A ppointment in 2 weeks. - w/ Dr. Mikey Bussing Tuesday 12/22/22 @ 12:45 Rm # 8 Return A Other: - keep Pulmonologist appt at end of February. Continue oral antibiotics till you see Infectious Disease. Ensure to see Vein and Vascular today for follow up tests. Anesthetic (In clinic) Topical Lidocaine 5% applied to wound bed Cellular or Tissue Based Products TARQUIN, WELCHER A ([id number]) [id number] Page 5 of 12 Wound #1 Right Achilles Cellular or Tissue Based Product Type: - Grafix approved-waiting to hear about copay or coinsurance-cost for you out of your pocket. Kerecsis denied by insurance. Grafix #1 applied on 09/24/22 Grafix #2 applied on 09/29/2022 Grafix # 3 applied on 10/07/22 Grafix # 4 applied on 10/19/22 Grafix # 5 applied 10/26/2022 Grafix applied to right achilles wound  11/03/2022 Grafix pending insurance approval 11/24/2022 Grafix approved from now to 02/14/2023 total 12. #7 applied. Grafix # 8 applied 12/01/22 Grafix # 9 applied 12/08/22 Cellular or Tissue Based Product applied to wound bed, secured with steri-strips, cover with Adaptic or Mepitel. (DO NOT REMOVE). Edema Control - Lymphedema / SCD / Other Avoid standing for long periods of time. Off-Loading Open toe surgical shoe to: - ****put PegAssist in surgical shoe.**** Prevalon Boot - left  foot while resting in bed at night. Other: - minimize walking and standing to aid in offloading pressure to wound. Hyperbaric Oxygen Therapy Wound #2 Left,Plantar Foot Evaluate for HBO Therapy Indication: - Wagner grade 3 If appropriate for treatment, begin HBOT per protocol: 2.0 ATA for 90 Minutes with 2 Five (5) Minute A Breaks ir Total Number of Treatments: - 40 One treatments per day (delivered Monday through Friday unless otherwise specified in Special Instructions below): Finger stick Blood Glucose Pre- and Post- HBOT Treatment. Follow Hyperbaric Oxygen Glycemia Protocol A frin (Oxymetazoline HCL) 0.05% nasal spray - 1 spray in both nostrils daily as needed prior to HBO treatment for difficulty clearing ears Wound Treatment Wound #1 - Achilles Wound Laterality: Right Cleanser: Soap and Water 1 x Per Week/30 Days Discharge Instructions: May shower and wash wound with dial antibacterial soap and water prior to dressing change. Cleanser: Wound Cleanser 1 x Per Week/30 Days Discharge Instructions: Cleanse the wound with wound cleanser prior to applying a clean dressing using gauze sponges, not tissue or cotton balls. Peri-Wound Care: Sween Lotion (Moisturizing lotion) 1 x Per Week/30 Days Discharge Instructions: Apply moisturizing lotion as directed Prim Dressing: Grafix #9 1 x Per Week/30 Days ary Discharge Instructions: applied by provider. Prim Dressing: adpatic and steri-strips 1 x Per Week/30  Days ary Discharge Instructions: to secure grafix in place. do not remove. Secondary Dressing: ABD Pad, 5x9 1 x Per Week/30 Days Discharge Instructions: Apply over primary dressing as directed. Secondary Dressing: Woven Gauze Sponge, Non-Sterile 4x4 in 1 x Per Week/30 Days Discharge Instructions: Apply over primary dressing as directed. Compression Wrap: Kerlix Roll 4.5x3.1 (in/yd) 1 x Per Week/30 Days Discharge Instructions: Apply Kerlix and Coban compression as directed. Compression Wrap: Coban Self-Adherent Wrap 4x5 (in/yd) 1 x Per Week/30 Days Discharge Instructions: Apply over Kerlix as directed. Wound #2 - Foot Wound Laterality: Plantar, Left Cleanser: Wound Cleanser 1 x Per Day/30 Days Discharge Instructions: Cleanse the wound with wound cleanser prior to applying a clean dressing using gauze sponges, not tissue or cotton balls. Peri-Wound Care: Skin Prep (Generic) 1 x Per Day/30 Days Discharge Instructions: Use skin prep as directed Prim Dressing: Dakin's Solution 0.25%, 16 (oz) 1 x Per Day/30 Days ary Discharge Instructions: Moisten gauze with Dakin's solution Secondary Dressing: Optifoam Non-Adhesive Dressing, 4x4 in (Generic) 1 x Per Day/30 Days Discharge Instructions: Apply over primary dressing as directed. Secondary Dressing: Woven Gauze Sponge, Non-Sterile 4x4 in (Generic) 1 x Per Day/30 Days Discharge Instructions: Apply over primary dressing as directed. MUNG, RINKER A ([id number]) [id number] Page 6 of 12 Secondary Dressing: Woven Gauze Sponges 2x2 in (Generic) 1 x Per Day/30 Days Discharge Instructions: Apply over primary dressing as directed. Secured With: Insurance underwriter, Sterile 2x75 (in/in) (Generic) 1 x Per Day/30 Days Discharge Instructions: Secure with stretch gauze as directed. Secured With: 39M Medipore H Soft Cloth Surgical T ape, 4 x 10 (in/yd) (Generic) 1 x Per Day/30 Days Discharge Instructions: Secure with  tape as directed. GLYCEMIA INTERVENTIONS PROTOCOL PRE-HBO GLYCEMIA INTERVENTIONS ACTION INTERVENTION Obtain pre-HBO capillary blood glucose (ensure 1 physician order is in chart). A. Notify HBO physician and await physician orders. 2 If result is 70 mg/dl or below: B. If the result meets the hospital definition of a critical result, follow hospital policy. A. Give patient an 8 ounce Glucerna Shake, an 8 ounce Ensure, or 8 ounces of a Glucerna/Ensure equivalent dietary supplement*. B. Wait 30 minutes. If result is 71 mg/dl to 956 mg/dl: C. Retest patients  capillary blood glucose (CBG). D. If result greater than or equal to 110 mg/dl, proceed with HBO. If result less than 110 mg/dl, notify HBO physician and consider holding HBO. If result is 131 mg/dl to 625 mg/dl: A. Proceed with HBO. A. Notify HBO physician and await physician orders. B. It is recommended to hold HBO and do If result is 250 mg/dl or greater: blood/urine ketone testing. C. If the result meets the hospital definition of a critical result, follow hospital policy. POST-HBO GLYCEMIA INTERVENTIONS ACTION INTERVENTION Obtain post HBO capillary blood glucose (ensure 1 physician order is in chart). A. Notify HBO physician and await physician orders. 2 If result is 70 mg/dl or below: B. If the result meets the hospital definition of a critical result, follow hospital policy. A. Give patient an 8 ounce Glucerna Shake, an 8 ounce Ensure, or 8 ounces of a Glucerna/Ensure equivalent dietary supplement*. B. Wait 15 minutes for symptoms of If result is 71 mg/dl to 638 mg/dl: hypoglycemia (i.e. nervousness, anxiety, sweating, chills, clamminess, irritability, confusion, tachycardia or dizziness). C. If patient asymptomatic, discharge patient. If patient symptomatic, repeat capillary blood glucose (CBG) and notify HBO physician. If result is 101 mg/dl to 937 mg/dl: A. Discharge patient. A. Notify HBO  physician and await physician orders. B. It is recommended to do blood/urine ketone If result is 250 mg/dl or greater: testing. C. If the result meets the hospital definition of a critical result, follow hospital policy. *Juice or candies are NOT equivalent products. If patient refuses the Glucerna or Ensure, please consult the hospital dietitian for an appropriate substitute. Electronic Signature(s) Signed: 12/08/2022 11:41:42 AM By: Geralyn Corwin DO Entered By: Geralyn Corwin on 12/08/2022 10:39:21 -------------------------------------------------------------------------------- Problem List Details Patient Name: Date of Service: [name] [name] [facility] A. 12/08/2022 9:30 A M Medical Record Number: [id number] Patient Account Number: [id number] MARQUEZ, CEESAY ([id number]) [phone].pdf Page 7 of 12 Date of Birth/Sex: Treating RN: 1951-10-18 (72 y.o. M) Primary Care Provider: Richarda Blade Other Clinician: Referring Provider: Treating Provider/Extender: Velna Ochs, [name] in Treatment: 20 Active Problems ICD-10 Encounter Code Description Active Date MDM Diagnosis L97.518 Non-pressure chronic ulcer of other part of right foot with other specified 07/16/2022 No Yes severity L97.522 Non-pressure chronic ulcer of other part of left foot with fat layer exposed 07/16/2022 No Yes E11.621 Type 2 diabetes mellitus with foot ulcer 07/16/2022 No Yes I73.9 Peripheral vascular disease, unspecified 07/16/2022 No Yes 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 07/23/2022 No Yes muscle M86.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, left ankle and foot 11/10/2022 No Yes Inactive Problems Resolved Problems Electronic Signature(s) Signed: 12/08/2022 11:41:42 AM By: Geralyn Corwin DO Entered By: Geralyn Corwin on 12/08/2022 10:35:45 -------------------------------------------------------------------------------- Progress  Note Details Patient Name: Date of Service: [name] [name] 12/08/2022 9:30 A M Medical Record Number: [id number] Patient Account Number: [id number] Date of Birth/Sex: Treating RN: 1951-08-23 (72 y.o. M) Primary Care Provider: Richarda Blade Other Clinician: Referring Provider: Treating Provider/Extender: Velna Ochs, [name] in Treatment: 20 Subjective Chief Complaint Information obtained from Patient 07/16/2022; bilateral lower extremity wounds History of Present Illness (HPI) Admission 07/16/2022 Mr. 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 He is not sure how the wounds started. He has been following with podiatry for this issue. He had removal of the tendon to the right lower extremity by Dr. Ralene Cork. He has been using Betadine to the wound beds. On 06/11/2022 he had a right posterior tibial artery angioplasty by Dr. Chestine Spore. It was reported the patient is optimized after revascularization of the right lower OCIE, TINO A ([id number]) [id number] Page 8 of 12 extremity. His ABIs on the left were 0.91. He currently denies systemic signs of infection. He also does not wear shoes. He came in with Kerlix wrap to his feet bilaterally. This did not completely cover his feet. 07/23/2022: This is a patient of Dr. Neita Garnet that she asked me to take a look at last week due to the significant involvement of the muscle and tendon on his right posterior leg wound. He needed an aggressive debridement and asked me if I would be able to perform this on her behalf. The patient is here today  for that procedure. When his dressing was removed in clinic today, the wound was teeming with maggots. There is necrotic muscle, tendon, and fat, with a thick layer of slough on the wound. 9/21; patient presents for follow-up. He was debrided by Dr. Lady Gary at last clinic visit without any issues. He has been placing Dakin's wet-to-dry dressings to the right posterior wound. He has been using Medihoney to the left foot wound. He reports improvement in wound healing. He has no issues or complaints today. 9/28; patient with type 2 diabetes PAD status post revascularization. He underwent retrograde right PTA angioplasty. Last saw Dr. Chestine Spore on 9/12 and was felt to have a brisk posterior tibial signal at the right ankle. He had a pulsatile toe tracing that appeared adequate for healing. He has been using Santyl and Hydrofera Blue on the right Achilles area. oo He has a smaller area on the left fifth plantar metatarsal head They were apparently in the ER on 9/23 with swelling and discoloration above the wrap. They have a picture of the leg after the wrap was taken off which looks like that it was excessively tight superiorly 10/6; patient presents for follow-up. We have been using Santyl and Hydrofera Blue to the right lower extremity wound under Kerlix/Coban. T the left foot we o have been using silver alginate with Medihoney. He again comes in with no shoes. He has no issues or complaints today. 10/12; patient presents for follow-up. We have been using Santyl and Hydrofera Blue to the right lower extremity under Kerlix/Coban And silver alginate to the left foot wound. He has no issues today. He denies signs of infection. 10/19; patient presents for follow-up. We continue to use Santyl and Hydrofera Blue to the right lower extremity under Kerlix/Coban and silver alginate to the left foot wound. There is been improvement in wound healing. 10/26; patient presents for follow-up. We have been using Santyl  and Hydrofera Blue to the right lower extremity under Kerlix/Coban and silver alginate to the left foot. There continues to be improvement in wound healing. Patient has no issues or complaints today. 11/2; the patient's area on the right Achilles heel looks quite healthy and is improved in terms of measurements. We have been using Hydrofera Blue. He is approved for Grafix On the left plantar foot wound over the fifth metatarsal head this tunnels over the lateral part of the met head. He is not offloading this at all 11/16; patient  presents for follow-up. He has been using a surgical shoe with an offloading felt pad to the left lateral foot along with silver alginate. He has been approved for Grafix and this is available for placement today T the right lower extremity wound. Patient Is agreeable to this. We have been using Santyl and o Hydrofera Blue under Kerlix/Coban to this area. 11/21; patient presents for follow-up. We have been doing Grafix to the right lower extremity and silver alginate to the left foot wound. He has no issues or complaints today. 11/30; patient presents for follow-up. We have been doing Grafix to the right lower extremity wound and Medihoney with Hydrofera Blue to the left lateral foot wound. He reports pain to the left foot wound today. He did not obtain his x-ray. 12/11; patient presents for follow-up. He has been using Dakin's wet-to-dry dressings to the left plantar foot wound. We have been doing Grafix to the right lower extremity wound under compression therapy. He obtained his x-ray that showed mild periosteal elevation at the resection site with the possibility of osteomyelitis. He currently denies systemic signs of infection. He has been taking doxycycline and Augmentin without issues. He is getting his INR checked by his primary care office. 12/18; patient presents for follow-up. He has been using Dakin's wet-to-dry dressings to the left foot wound. We have been  placing Grafix under compression therapy to the right lower extremity. He is currently taking doxycycline and Augmentin. He denies signs of infection. 12/26; patient presents for follow-up. He has been using Dakin's wet-to-dry dressings to the left foot. He has been off oral antibiotics for potential bone biopsy. We have been placing Grafix under compression therapy to the right lower extremity. There has been improvement in wound healing to both sites. He states he is trying to aggressively offload the left foot. He currently denies signs of infection. 1/2; patient presents for follow-up. He has been doing Dakin's wet-to-dry dressings to the left foot. He saw Dr. Blenda Mounts today and had a bone biopsy. Debridement was performed on the site. Dressing in place with Dakin's wet-to-dry. T the right leg we have been using Grafix under compression therapy. He o has no issues or complaints today. 1/9; patient presents for follow-up. We have been using Hydrofera Blue under compression therapy to the right lower extremity wound. This is well-healing. He has been using Dakin's wet-to-dry dressings to the left lateral foot wound. He has been taking ciprofloxacin due to culture results without issues. He was referred to infectious disease by podiatry. He does not have an appointment yet. 1/16; patient presents for follow-up. We were using collagen under Kerlix/Coban to the right lower extremity wound. He has been approved for Grafix and this was available for placement today. He continues to use Dakin's wet-to-dry dressings daily to the left foot. He started levofloxacin. He has an appointment with infectious disease on 1/24. 1/23; patient presents for follow-up. Grafix was placed in standard fashion to the right lower extremity wound last week. He has been using Dakin's wet-to-dry dressings to the left foot. He has been taking levofloxacin. He has an infectious disease appointment tomorrow as well as a pulmonology  appointment. He followed up with vein and vascular on 1/16 and plan is for angiogram of the left lower extremity. He has a history of left peroneal angioplasty in 2022. 1/30; patient presents for follow-up. Grafix was placed in standard fashion to the right lower extremity wound last week. There has been improvement in healing here. He has  been using Dakin's wet-to-dry dressings to the left foot. Patient saw infectious disease, Dr. Earlene Plater on 1/24 and plan is for PICC line on 2/5 with cefepime. For now he is taking levofloxacin. He has been cleared by pulmonology for HBO. Due to extensive heart history we have asked HBO clearance from his cardiologist as well. He has follow-up with them tomorrow. He currently has no issues or complaints today. Patient History Information obtained from Chart. Family History Diabetes - Mother, Heart Disease - Mother,Siblings. Social History Current every day smoker, Alcohol Use - Never, Drug Use - No History, Caffeine Use - Never. Medical History Respiratory MAVIS, FICHERA A ([id number]) [id number] Page 9 of 12 Patient has history of Sleep Apnea - does not wear Cardiovascular Patient has history of Arrhythmia - A. Fib, Congestive Heart Failure - EF 25%, Hypertension, Peripheral Arterial Disease Denies history of Peripheral Venous Disease Endocrine Patient has history of Type II Diabetes Hospitalization/Surgery History - Angiogram 06/11/2022 Dr. Chestine Spore [facility]. - cardioversion 11/06/2021, 2015, 2017. 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 Medical A Surgical History Notes nd Gastrointestinal Ascites Genitourinary stage III Kidney disease Objective Constitutional respirations regular, non-labored and within target range for patient.. Vitals Time Taken: 9:46 AM, Height: 68 in, Weight: 185 lbs, BMI: 28.1, Pulse: 85 bpm, Respiratory Rate: 18 breaths/min, Blood Pressure: 149/82 mmHg. Cardiovascular 2+ dorsalis pedis/posterior tibialis  pulses. Psychiatric pleasant and cooperative. General Notes: Right lower extremity: T the posterior aspect there is an open wound with granulation tissue and nonviable tissue. Venous stasis dermatitis. T o o the left lower extremity there is An open wound that undermines and probes to bone. No surrounding signs of soft tissue infection. Integumentary (Hair, Skin) Wound #1 status is Open. Original cause of wound was Surgical Injury. The date acquired was: 07/16/2022. The wound has been in treatment 20 weeks. The wound is located on the Right Achilles. The wound measures 0.5cm length x 0.3cm width x 0.1cm depth; 0.118cm^2 area and 0.012cm^3 volume. There is Fat Layer (Subcutaneous Tissue) exposed. There is no tunneling or undermining noted. There is a none present amount of drainage noted. The wound margin is distinct with the outline attached to the wound base. There is large (67-100%) granulation within the wound bed. There is no necrotic tissue within the wound bed. The periwound skin appearance exhibited: Dry/Scaly. The periwound skin appearance did not exhibit: Callus, Crepitus, Excoriation, Induration, Rash, Scarring, Maceration, Atrophie Blanche, Cyanosis, Ecchymosis, Hemosiderin Staining, Mottled, Pallor, Rubor, Erythema. Wound #2 status is Open. Original cause of wound was Gradually Appeared. The date acquired was: 03/09/2022. The wound has been in treatment 20 weeks. The wound is located on the Left,Plantar Foot. The wound measures 0.4cm length x 0.9cm width x 0.4cm depth; 0.283cm^2 area and 0.113cm^3 volume. There is bone and Fat Layer (Subcutaneous Tissue) exposed. There is no tunneling or undermining noted. There is a medium amount of serosanguineous drainage noted. The wound margin is distinct with the outline attached to the wound base. There is large (67-100%) pink, friable granulation within the wound bed. There is no necrotic tissue within the wound bed. The periwound skin appearance  exhibited: Callus, Dry/Scaly. The periwound skin appearance did not exhibit: Crepitus, Excoriation, Induration, Rash, Scarring, Maceration, Atrophie Blanche, Cyanosis, Ecchymosis, Hemosiderin Staining, Mottled, Pallor, Rubor, Erythema. 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 I debrided nonviable tissue. Grafix was placed in standard fashion. Continue Kerlix/Coban here. The left foot wound still probes to bone however overall appearance is healthier week by week. He has been cleared by pulmonology for HBO. If cardiology also clears for HBO he will start next Monday. He will have orientation for HBO today. Risks and benefits of HBO were discussed. Patient would like to proceed. Procedures Wound #1 Pre-procedure diagnosis of Wound #1 is a Diabetic Wound/Ulcer of the Lower Extremity located on the Right Achilles .Severity of Tissue Pre Debridement is: Fat layer exposed. There was a Excisional Skin/Subcutaneous Tissue Debridement with a total area of 0.15 sq cm performed by Geralyn Corwin, DO. With NAEL, PETROSYAN A ([id number]) [id number] Page 10 of 12 the following instrument(s): Curette to remove Viable and Non-Viable tissue/material. Material removed includes Subcutaneous Tissue and Slough and after achieving pain control using Lidocaine. No specimens were taken. A time out was conducted at 10:22, prior to the start of the procedure. A Minimum amount of bleeding was controlled with Pressure. The procedure was tolerated well with a pain level of 0  throughout and a pain level of 0 following the procedure. Post Debridement Measurements: 0.5cm length x 0.3cm width x 0.1cm depth; 0.012cm^3 volume. Character of Wound/Ulcer Post Debridement is improved. Severity of Tissue Post Debridement is: Fat layer exposed. Post procedure Diagnosis Wound #1: Same as Pre-Procedure Pre-procedure diagnosis of Wound #1 is a Diabetic Wound/Ulcer of the Lower Extremity located on the Right Achilles. A skin graft procedure using a bioengineered skin substitute/cellular or tissue based product was performed by Geralyn Corwin, DO with the following instrument(s): Forceps and Scissors. Grafix prime was applied and secured with Steri-Strips. 6 sq cm of product was utilized and 0 sq cm was wasted. Post Application, adaptic was applied. A Time Out was conducted at 10:25, prior to the start of the procedure. The procedure was tolerated well with a pain level of 0 throughout and a pain level of 0 following the procedure. Post procedure Diagnosis Wound #1: Same as Pre-Procedure . Plan Follow-up Appointments: Return Appointment in 1 week. - w/ Dr. Mikey Bussing Tuesday 12/15/22 @ 1:30 Rm # 7 Return Appointment in 2 weeks. - w/ Dr. Mikey Bussing Tuesday 12/22/22 @ 12:45 Rm # 8 Other: - keep Pulmonologist appt at end of February. Continue oral antibiotics till you see Infectious Disease. Ensure to see Vein and Vascular today for follow up tests. Anesthetic: (In clinic) Topical Lidocaine 5% applied to wound bed Cellular or Tissue Based Products: Wound #1 Right Achilles: Cellular or Tissue Based Product Type: - Grafix approved-waiting to hear about copay or coinsurance-cost for you out of your pocket. Kerecsis denied by insurance. Grafix #1 applied on 09/24/22 Grafix #2 applied on 09/29/2022 Grafix # 3 applied on 10/07/22 Grafix # 4 applied on 10/19/22 Grafix # 5 applied 10/26/2022 Grafix applied to right achilles wound 11/03/2022 Grafix pending insurance approval 11/24/2022 Grafix  approved from now to 02/14/2023 total 12. #7 applied. Grafix # 8 applied 12/01/22 Grafix # 9 applied 12/08/22 Cellular or Tissue Based Product applied to wound bed, secured with steri-strips, cover with Adaptic or Mepitel. (DO NOT REMOVE). Edema Control - Lymphedema / SCD / Other: Avoid standing for long periods of time. Off-Loading: Open toe surgical shoe to: - ****put PegAssist in surgical shoe.**** Prevalon Boot - left foot while resting in  bed at night. Other: - minimize walking and standing to aid in offloading pressure to wound. Hyperbaric Oxygen Therapy: Wound #2 Left,Plantar Foot: Evaluate for HBO Therapy Indication: - Wagner grade 3 If appropriate for treatment, begin HBOT per protocol: 2.0 ATA for 90 Minutes with 2 Five (5) Minute Air Breaks T Number of Treatments: - 40 otal One treatments per day (delivered Monday through Friday unless otherwise specified in Special Instructions below): Finger stick Blood Glucose Pre- and Post- HBOT Treatment. Follow Hyperbaric Oxygen Glycemia Protocol Afrin (Oxymetazoline HCL) 0.05% nasal spray - 1 spray in both nostrils daily as needed prior to HBO treatment for difficulty clearing ears WOUND #1: - Achilles Wound Laterality: Right Cleanser: Soap and Water 1 x Per Week/30 Days Discharge Instructions: May shower and wash wound with dial antibacterial soap and water prior to dressing change. Cleanser: Wound Cleanser 1 x Per Week/30 Days Discharge Instructions: Cleanse the wound with wound cleanser prior to applying a clean dressing using gauze sponges, not tissue or cotton balls. Peri-Wound Care: Sween Lotion (Moisturizing lotion) 1 x Per Week/30 Days Discharge Instructions: Apply moisturizing lotion as directed Prim Dressing: Grafix #9 1 x Per Week/30 Days ary Discharge Instructions: applied by provider. Prim Dressing: adpatic and steri-strips 1 x Per Week/30 Days ary Discharge Instructions: to secure grafix in place. do not  remove. Secondary Dressing: ABD Pad, 5x9 1 x Per Week/30 Days Discharge Instructions: Apply over primary dressing as directed. Secondary Dressing: Woven Gauze Sponge, Non-Sterile 4x4 in 1 x Per Week/30 Days Discharge Instructions: Apply over primary dressing as directed. Com pression Wrap: Kerlix Roll 4.5x3.1 (in/yd) 1 x Per Week/30 Days Discharge Instructions: Apply Kerlix and Coban compression as directed. Com pression Wrap: Coban Self-Adherent Wrap 4x5 (in/yd) 1 x Per Week/30 Days Discharge Instructions: Apply over Kerlix as directed. WOUND #2: - Foot Wound Laterality: Plantar, Left Cleanser: Wound Cleanser 1 x Per Day/30 Days Discharge Instructions: Cleanse the wound with wound cleanser prior to applying a clean dressing using gauze sponges, not tissue or cotton balls. Peri-Wound Care: Skin Prep (Generic) 1 x Per Day/30 Days Discharge Instructions: Use skin prep as directed Prim Dressing: Dakin's Solution 0.25%, 16 (oz) 1 x Per Day/30 Days ary Discharge Instructions: Moisten gauze with Dakin's solution Secondary Dressing: Optifoam Non-Adhesive Dressing, 4x4 in (Generic) 1 x Per Day/30 Days Discharge Instructions: Apply over primary dressing as directed. Secondary Dressing: Woven Gauze Sponge, Non-Sterile 4x4 in (Generic) 1 x Per Day/30 Days Discharge Instructions: Apply over primary dressing as directed. Secondary Dressing: Woven Gauze Sponges 2x2 in (Generic) 1 x Per Day/30 Days Discharge Instructions: Apply over primary dressing as directed. Secured With: Insurance underwriter, Sterile 2x75 (in/in) (Generic) 1 x Per Day/30 Days Discharge Instructions: Secure with stretch gauze as directed. Secured With: 42M Medipore H Soft Cloth Surgical T ape, 4 x 10 (in/yd) (Generic) 1 x Per Day/30 Days Discharge Instructions: Secure with tape as directed. YORDY, MACASKILL A ([id number]) [id number] Page 11 of 12 1. In office sharp debridement 2. Grafix placed  in standard fashion to the right lower extremity 3. Kerlix/Coban 4. Dakin's wet-to-dry dressings 5. Follow-up in 1 week Electronic Signature(s) Signed: 12/08/2022 11:41:42 AM By: Geralyn Corwin DO Entered By: Geralyn Corwin on 12/08/2022 10:41:54 -------------------------------------------------------------------------------- HxROS Details Patient Name: Date of Service: [name] [name] 12/08/2022 9:30 A M Medical Record Number: [id number] Patient Account Number: [id number] Date of Birth/Sex: Treating RN: 14-Aug-1951 (72 y.o. M) Primary Care Provider: Richarda Blade Other Clinician: Referring Provider: Treating Provider/Extender: Geralyn Corwin  Ngetich, [name] in Treatment: 20 Information Obtained From Chart Respiratory Medical History: Positive for: Sleep Apnea - does not wear Cardiovascular Medical History: Positive for: Arrhythmia - A. Fib; Congestive Heart Failure - EF 25%; Hypertension; Peripheral Arterial Disease Negative for: Peripheral Venous Disease Gastrointestinal Medical History: Past Medical History Notes: Ascites Endocrine Medical History: Positive for: Type II Diabetes Time with diabetes: 6 years Treated with: Insulin Blood sugar tested every day: No Genitourinary Medical History: Past Medical History Notes: stage III Kidney disease Immunizations Pneumococcal Vaccine: Received Pneumococcal Vaccination: No Implantable Devices No devices added Hospitalization / Surgery History Type of Hospitalization/Surgery Angiogram 06/11/2022 Dr. 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 DAROL, CUSH A ([id number]) [phone].pdf Page 12 of 66 Family and Social History Diabetes: Yes - Mother; Heart Disease: Yes - Mother,Siblings; Current every day smoker; Alcohol Use: Never; Drug Use: No History; Caffeine Use: Never; Financial Concerns: No; Food, Clothing or Shelter Needs: No; Support  System Lacking: No; Transportation Concerns: No Electronic Signature(s) Signed: 12/08/2022 11:41:42 AM By: [name] DO Entered By: [name] on 12/08/2022 10:38:52 -------------------------------------------------------------------------------- SuperBill Details Patient Name: Date of Service: [name] 12/08/2022 Medical Record Number: [id number] Patient Account Number: [id number] Date of Birth/Sex: Treating RN: 1951/10/06 (72 y.o. 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 M86.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, left ankle and foot Facility Procedures : CPT4 Code: 24235361 Description: 44315 - WOUND CARE VISIT-LEV 3 EST PT Modifier: Quantity: 1 : CPT4 Code: 40086761 Description: P5093- Grafix PL 2X3 sq cm (6 units) Modifier: Quantity: 6 : CPT4 Code: [id number] Description: 09983 - SKIN SUB GRAFT TRNK/ARM/LEG ICD-10 Diagnosis Description 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 Modifier: cle Quantity: 1 Physician Procedures : CPT4 Code Description Modifier [id number] - WC PHYS SKIN SUB GRAFT TRNK/ARM/LEG ICD-10 Diagnosis Description Z76.734 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

## 2022-12-10 ENCOUNTER — Encounter: Payer: Self-pay | Admitting: Physician Assistant

## 2022-12-10 ENCOUNTER — Ambulatory Visit (HOSPITAL_COMMUNITY)
Admission: RE | Admit: 2022-12-10 | Discharge: 2022-12-10 | Disposition: A | Discharge status: Home / Self Care | Payer: Medicare HMO | Source: Ambulatory Visit | Attending: Vascular Surgery | Admitting: Vascular Surgery

## 2022-12-10 ENCOUNTER — Encounter (HOSPITAL_COMMUNITY)
Admission: RE | Disposition: A | Discharge status: Home / Self Care | Payer: Self-pay | Source: Ambulatory Visit | Attending: Vascular Surgery

## 2022-12-10 ENCOUNTER — Other Ambulatory Visit: Payer: Self-pay

## 2022-12-10 DIAGNOSIS — Z7901 Long term (current) use of anticoagulants: Secondary | ICD-10-CM | POA: Insufficient documentation

## 2022-12-10 DIAGNOSIS — I130 Hypertensive heart and chronic kidney disease with heart failure and stage 1 through stage 4 chronic kidney disease, or unspecified chronic kidney disease: Secondary | ICD-10-CM | POA: Diagnosis not present

## 2022-12-10 DIAGNOSIS — M869 Osteomyelitis, unspecified: Secondary | ICD-10-CM | POA: Diagnosis not present

## 2022-12-10 DIAGNOSIS — Z7902 Long term (current) use of antithrombotics/antiplatelets: Secondary | ICD-10-CM | POA: Insufficient documentation

## 2022-12-10 DIAGNOSIS — I70212 Atherosclerosis of native arteries of extremities with intermittent claudication, left leg: Secondary | ICD-10-CM | POA: Diagnosis not present

## 2022-12-10 DIAGNOSIS — Z794 Long term (current) use of insulin: Secondary | ICD-10-CM | POA: Diagnosis not present

## 2022-12-10 DIAGNOSIS — I70223 Atherosclerosis of native arteries of extremities with rest pain, bilateral legs: Secondary | ICD-10-CM

## 2022-12-10 DIAGNOSIS — N183 Chronic kidney disease, stage 3 unspecified: Secondary | ICD-10-CM | POA: Diagnosis not present

## 2022-12-10 DIAGNOSIS — I70245 Atherosclerosis of native arteries of left leg with ulceration of other part of foot: Secondary | ICD-10-CM | POA: Diagnosis not present

## 2022-12-10 DIAGNOSIS — E1151 Type 2 diabetes mellitus with diabetic peripheral angiopathy without gangrene: Secondary | ICD-10-CM | POA: Diagnosis not present

## 2022-12-10 DIAGNOSIS — M86472 Chronic osteomyelitis with draining sinus, left ankle and foot: Secondary | ICD-10-CM

## 2022-12-10 DIAGNOSIS — I4891 Unspecified atrial fibrillation: Secondary | ICD-10-CM | POA: Diagnosis not present

## 2022-12-10 DIAGNOSIS — Z87891 Personal history of nicotine dependence: Secondary | ICD-10-CM | POA: Insufficient documentation

## 2022-12-10 DIAGNOSIS — I5022 Chronic systolic (congestive) heart failure: Secondary | ICD-10-CM | POA: Diagnosis not present

## 2022-12-10 HISTORY — PX: ABDOMINAL AORTOGRAM W/LOWER EXTREMITY: CATH118223

## 2022-12-10 HISTORY — PX: PERIPHERAL VASCULAR BALLOON ANGIOPLASTY: CATH118281

## 2022-12-10 LAB — POCT I-STAT, CHEM 8
BUN: 30 mg/dL — ABNORMAL HIGH (ref 8–23)
Calcium, Ion: 1.24 mmol/L (ref 1.15–1.40)
Chloride: 103 mmol/L (ref 98–111)
Creatinine, Ser: 1.9 mg/dL — ABNORMAL HIGH (ref 0.61–1.24)
Glucose, Bld: 130 mg/dL — ABNORMAL HIGH (ref 70–99)
HCT: 42 % (ref 39.0–52.0)
Hemoglobin: 14.3 g/dL (ref 13.0–17.0)
Potassium: 4 mmol/L (ref 3.5–5.1)
Sodium: 143 mmol/L (ref 135–145)
TCO2: 25 mmol/L (ref 22–32)

## 2022-12-10 LAB — PROTIME-INR
INR: 1.2 (ref 0.8–1.2)
Prothrombin Time: 15.3 seconds — ABNORMAL HIGH (ref 11.4–15.2)

## 2022-12-10 LAB — GLUCOSE, CAPILLARY: Glucose-Capillary: 115 mg/dL — ABNORMAL HIGH (ref 70–99)

## 2022-12-10 SURGERY — ABDOMINAL AORTOGRAM W/LOWER EXTREMITY
Anesthesia: LOCAL | Laterality: Left

## 2022-12-10 MED ORDER — HEPARIN (PORCINE) IN NACL 1000-0.9 UT/500ML-% IV SOLN
INTRAVENOUS | Status: DC | PRN
  Administered 2022-12-10: 500 mL

## 2022-12-10 MED ORDER — HEPARIN (PORCINE) IN NACL 1000-0.9 UT/500ML-% IV SOLN
INTRAVENOUS | Status: AC
  Filled 2022-12-10: qty 1000

## 2022-12-10 MED ORDER — SODIUM CHLORIDE 0.9% FLUSH: 3.0000 mL | Freq: Two times a day (BID) | INTRAVENOUS | Status: DC

## 2022-12-10 MED ORDER — FENTANYL CITRATE (PF) 100 MCG/2ML IJ SOLN
INTRAMUSCULAR | Status: AC
  Filled 2022-12-10: qty 2

## 2022-12-10 MED ORDER — ACETAMINOPHEN 325 MG PO TABS: 650.0000 mg | ORAL_TABLET | ORAL | Status: DC | PRN

## 2022-12-10 MED ORDER — HYDRALAZINE HCL 20 MG/ML IJ SOLN: 5.0000 mg | INTRAMUSCULAR | Status: DC | PRN

## 2022-12-10 MED ORDER — SODIUM CHLORIDE 0.9 % IV SOLN: INTRAVENOUS | Status: DC

## 2022-12-10 MED ORDER — NITROGLYCERIN 1 MG/10 ML FOR IR/CATH LAB
INTRA_ARTERIAL | Status: DC | PRN
  Administered 2022-12-10: 200 ug via INTRA_ARTERIAL

## 2022-12-10 MED ORDER — LIDOCAINE HCL (PF) 1 % IJ SOLN
INTRAMUSCULAR | Status: AC
  Filled 2022-12-10: qty 30

## 2022-12-10 MED ORDER — HEPARIN SODIUM (PORCINE) 1000 UNIT/ML IJ SOLN
INTRAMUSCULAR | Status: AC
  Filled 2022-12-10: qty 10

## 2022-12-10 MED ORDER — HEPARIN SODIUM (PORCINE) 1000 UNIT/ML IJ SOLN
INTRAMUSCULAR | Status: DC | PRN
  Administered 2022-12-10: 9000 [IU] via INTRAVENOUS

## 2022-12-10 MED ORDER — CLOPIDOGREL BISULFATE 75 MG PO TABS
ORAL_TABLET | ORAL | Status: AC
  Filled 2022-12-10: qty 1

## 2022-12-10 MED ORDER — ONDANSETRON HCL 4 MG/2ML IJ SOLN: 4.0000 mg | Freq: Four times a day (QID) | INTRAMUSCULAR | Status: DC | PRN

## 2022-12-10 MED ORDER — SODIUM CHLORIDE 0.9 % IV SOLN: 250.0000 mL | INTRAVENOUS | Status: DC | PRN

## 2022-12-10 MED ORDER — CLOPIDOGREL BISULFATE 300 MG PO TABS
ORAL_TABLET | ORAL | Status: DC | PRN
  Administered 2022-12-10: 75 mg via ORAL

## 2022-12-10 MED ORDER — SODIUM CHLORIDE 0.9% FLUSH: 3.0000 mL | INTRAVENOUS | Status: DC | PRN

## 2022-12-10 MED ORDER — LIDOCAINE HCL (PF) 1 % IJ SOLN
INTRAMUSCULAR | Status: DC | PRN
  Administered 2022-12-10: 18 mL

## 2022-12-10 MED ORDER — CLOPIDOGREL BISULFATE 75 MG PO TABS: 75.0000 mg | ORAL_TABLET | Freq: Every day | ORAL | Status: DC

## 2022-12-10 MED ORDER — IODIXANOL 320 MG/ML IV SOLN
INTRAVENOUS | Status: DC | PRN
  Administered 2022-12-10: 40 mL

## 2022-12-10 MED ORDER — MIDAZOLAM HCL 2 MG/2ML IJ SOLN
INTRAMUSCULAR | Status: AC
  Filled 2022-12-10: qty 2

## 2022-12-10 MED ORDER — NITROGLYCERIN 1 MG/10 ML FOR IR/CATH LAB
INTRA_ARTERIAL | Status: AC
  Filled 2022-12-10: qty 10

## 2022-12-10 MED ORDER — LABETALOL HCL 5 MG/ML IV SOLN: 10.0000 mg | INTRAVENOUS | Status: DC | PRN

## 2022-12-10 MED ORDER — FENTANYL CITRATE (PF) 100 MCG/2ML IJ SOLN
INTRAMUSCULAR | Status: DC | PRN
  Administered 2022-12-10: 50 ug via INTRAVENOUS

## 2022-12-10 MED ORDER — MIDAZOLAM HCL 2 MG/2ML IJ SOLN
INTRAMUSCULAR | Status: DC | PRN
  Administered 2022-12-10: 2 mg via INTRAVENOUS

## 2022-12-10 SURGICAL SUPPLY — 18 items
BALLN STERLING SL OTW 3X80X150 (BALLOONS) ×1
BALLOON STRLNG SL OTW 3X80X150 (BALLOONS) IMPLANT
CATH ANGIO 5F PIGTAIL 65CM (CATHETERS) IMPLANT
CATH CROSS OVER TEMPO 5F (CATHETERS) IMPLANT
CATH STRAIGHT 5FR 65CM (CATHETERS) IMPLANT
DEVICE CLOSURE MYNXGRIP 5F (Vascular Products) IMPLANT
KIT ANGIASSIST CO2 SYSTEM (KITS) IMPLANT
KIT ENCORE 26 ADVANTAGE (KITS) IMPLANT
KIT MICROPUNCTURE NIT STIFF (SHEATH) IMPLANT
KIT PV (KITS) ×1 IMPLANT
SHEATH CATAPULT 5F 45 MP (SHEATH) IMPLANT
SHEATH PINNACLE 5F 10CM (SHEATH) IMPLANT
SHEATH PROBE COVER 6X72 (BAG) IMPLANT
STOPCOCK MORSE 400PSI 3WAY (MISCELLANEOUS) IMPLANT
TRANSDUCER W/STOPCOCK (MISCELLANEOUS) ×1 IMPLANT
TRAY PV CATH (CUSTOM PROCEDURE TRAY) ×1 IMPLANT
WIRE BENTSON .035X145CM (WIRE) IMPLANT
WIRE G V18X300CM (WIRE) IMPLANT

## 2022-12-10 NOTE — Discharge Instructions (Signed)
Femoral Site Care This sheet gives you information about how to care for yourself after your procedure. Your health care provider may also give you more specific instructions. If you have problems or questions, contact your health care provider. What can I expect after the procedure?  After the procedure, it is common to have: Bruising that usually fades within 1-2 weeks. Tenderness at the site. Follow these instructions at home: Wound care Follow instructions from your health care provider about how to take care of your insertion site. Make sure you: Wash your hands with soap and water before you change your bandage (dressing). If soap and water are not available, use hand sanitizer. Remove your dressing as told by your health care provider. 24 hours Do not take baths, swim, or use a hot tub until your health care provider approves. You may shower 24-48 hours after the procedure or as told by your health care provider. Gently wash the site with plain soap and water. Pat the area dry with a clean towel. Do not rub the site. This may cause bleeding. Do not apply powder or lotion to the site. Keep the site clean and dry. Check your femoral site every day for signs of infection. Check for: Redness, swelling, or pain. Fluid or blood. Warmth. Pus or a bad smell. Activity For the first 2-3 days after your procedure, or as long as directed: Avoid climbing stairs as much as possible. Do not squat. Do not lift anything that is heavier than 10 lb (4.5 kg), or the limit that you are told, until your health care provider says that it is safe. For 5 days Rest as directed. Avoid sitting for a long time without moving. Get up to take short walks every 1-2 hours. Do not drive for 24 hours if you were given a medicine to help you relax (sedative). General instructions Take over-the-counter and prescription medicines only as told by your health care provider. Keep all follow-up visits as told by your  health care provider. This is important. Contact a health care provider if you have: A fever or chills. You have redness, swelling, or pain around your insertion site. Get help right away if: The catheter insertion area swells very fast. You pass out. You suddenly start to sweat or your skin gets clammy. The catheter insertion area is bleeding, and the bleeding does not stop when you hold steady pressure on the area. The area near or just beyond the catheter insertion site becomes pale, cool, tingly, or numb. These symptoms may represent a serious problem that is an emergency. Do not wait to see if the symptoms will go away. Get medical help right away. Call your local emergency services (911 in the [place] Do not drive yourself to the hospital. Summary After the procedure, it is common to have bruising that usually fades within 1-2 weeks. Check your femoral site every day for signs of infection. Do not lift anything that is heavier than 10 lb (4.5 kg), or the limit that you are told, until your health care provider says that it is safe. This information is not intended to replace advice given to you by your health care provider. Make sure you discuss any questions you have with your health care provider. Document Revised: 11/08/2017 Document Reviewed: 11/08/2017 Elsevier Patient Education  2020 Elsevier Inc.'  

## 2022-12-10 NOTE — H&P (Signed)
History and Physical Interval Note:  12/10/2022 8:09 AM  [name]  has presented today for surgery, with the diagnosis of ichemia of [name] lower extremities.  The various methods of treatment have been discussed with the patient and family. After consideration of risks, benefits and other options for treatment, the patient has consented to  Procedure(s): ABDOMINAL AORTOGRAM W/LOWER EXTREMITY (N/A) as a surgical intervention.  The patient's history has been reviewed, patient examined, no change in status, stable for surgery.  I have reviewed the patient's chart and labs.  Questions were answered to the patient's satisfaction.    Focus on left leg with tissue loss.  Cephus Shelling     Patient name: [name]      MRN: [id number]        DOB: 07/14/1951        Sex: male   REASON FOR CONSULT: 3 month follow-up for PAD/CLI   HPI: [name] is a 72 y.o. male, with history of atrial fibrillation, congestive heart failure, CKD, diabetes, hypertension, PVD that presents for 3 month follow-up for wound check.  He recent underwent angiogram on 06/11/22 with right PT angioplasty requiring retrograde pedal access for CLI with tissue loss.  Wound is now being followed by the wound clinic.  The daughter states that this has made significant progress.  They have been placing compression wraps on it now and he gets weekly changes in the wound clinic.   He has previously undergone left peroneal angioplasty on 05/19/2021 for left fifth toe gangrene.  Unfortunately sounds like the left fifth toe now has evidence of osteomyelitis.       Past Medical History:  Diagnosis Date   A-fib [facility])      a. (06/04/14) TEE-DC-CV; succesful; large LA 6.2 cm   ADHD     Ascites     Athlete's foot     Chronic systolic heart failure (HCC)      a. 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, unspecified CKD stage, unspecified whether long term insulin use (HCC)     Heart murmur     HTN (hypertension)     OSA (obstructive sleep apnea)     PVD (peripheral vascular disease) (HCC)      s/p great toe amputation           Past Surgical History:  Procedure Laterality Date   ABDOMINAL AORTOGRAM W/LOWER EXTREMITY N/A 05/19/2021    Procedure: ABDOMINAL AORTOGRAM W/LOWER EXTREMITY;  Surgeon: Cephus Shelling, MD;  Location: [facility] INVASIVE CV LAB;  Service: Cardiovascular;  Laterality: N/A;   ABDOMINAL AORTOGRAM W/LOWER EXTREMITY Right 06/11/2022    Procedure: ABDOMINAL AORTOGRAM W/LOWER EXTREMITY;  Surgeon: Cephus Shelling, MD;  Location: [facility] INVASIVE CV LAB;  Service: Cardiovascular;  Laterality: Right;   AMPUTATION Left 05/22/2021    Procedure: LEFT FIFTH TOE AMPUTATION;  Surgeon: Cephus Shelling, MD;  Location: [facility] OR;  Service: Vascular;  Laterality: Left;   CARDIOVERSION N/A 06/04/2014    Procedure: CARDIOVERSION;  Surgeon: Laurey Morale, MD;  Location: [facility] ENDOSCOPY;  Service: Cardiovascular;  Laterality: N/A;   CARDIOVERSION N/A 11/06/2021    Procedure: CARDIOVERSION;  Surgeon: Laurey Morale, MD;  Location: [facility] ENDOSCOPY;  Service: Cardiovascular;  Laterality: N/A;  NOSE SURGERY        Nasal septum surgery   PERIPHERAL VASCULAR BALLOON ANGIOPLASTY   06/11/2022    Procedure: PERIPHERAL VASCULAR BALLOON ANGIOPLASTY;  Surgeon: Marty Heck, MD;  Location: [place] CV LAB;  Service: Cardiovascular;;   PERIPHERAL VASCULAR INTERVENTION Left 05/19/2021    Procedure: PERIPHERAL VASCULAR INTERVENTION;  Surgeon: Marty Heck, MD;  Location: [place] CV LAB;  Service: Cardiovascular;  Laterality: Left;   TEE WITHOUT CARDIOVERSION N/A 06/04/2014    Procedure: TRANSESOPHAGEAL ECHOCARDIOGRAM (TEE);  Surgeon: Larey Dresser, MD;  Location: [place];  Service:  Cardiovascular;  Laterality: N/A;   TEE WITHOUT CARDIOVERSION N/A 01/06/2016    Procedure: TRANSESOPHAGEAL ECHOCARDIOGRAM (TEE);  Surgeon: Fay Records, MD;  Location: [place];  Service: Cardiovascular;  Laterality: N/A;   TEE WITHOUT CARDIOVERSION N/A 11/06/2021    Procedure: TRANSESOPHAGEAL ECHOCARDIOGRAM (TEE);  Surgeon: Larey Dresser, MD;  Location: [facility] ENDOSCOPY;  Service: Cardiovascular;  Laterality: N/A;   VASECTOMY               Family History  Problem Relation Age of Onset   Heart attack Mother          deceased   Diabetes Mother     Heart attack Sister        SOCIAL HISTORY: Social History         Socioeconomic History   Marital status: Single      Spouse name: Not on file   Number of children: Not on file   Years of education: Not on file   Highest education level: Not on file  Occupational History   Occupation: retired  Tobacco Use   Smoking status: Some Days      Years: 52.00      Types: Cigarettes, Cigars      Last attempt to quit: 05/2021      Years since quitting: 1.5      Passive exposure: Never   Smokeless tobacco: Never  Vaping Use   Vaping Use: Never used  Substance and Sexual Activity   Alcohol use: Not Currently   Drug use: Never   Sexual activity: Not on file  Other Topics Concern   Not on file  Social History Narrative    ** Merged History Encounter ** [place]. Retired from [employer] and [employer] for [employer].          Tobacco use, amount per day now:    Past tobacco use, amount per day:    How many years did you use tobacco:    Alcohol use (drinks per week): N/A    Diet:    Do you drink/eat things with caffeine:    Marital status:  Divorced                                What year were you married? 2003    Do you live in a house, apartment, assisted living, condo, trailer, etc.? House    Is it one or more stories? One    How many persons live in your home?    Do you have pets in your home?( please  list) N/A    Highest Level of education completed? Bachelors Degree    Current or past profession: Teacher-Special Ed    Do you exercise?  Yes  Type and how often? Daily squats, and push ups.    Do you have a living will? No    Do you have a DNR form?  No                                 If not, do you want to discuss one?    Do you have signed POA/HPOA forms?  No                      If so, please bring to you appointment         Do you have any difficulty bathing or dressing yourself? Bathing only if pants not shirts/not shorts.    Do you have any difficulty preparing food or eating? No    Do you have any difficulty managing your medications? No    Do you have any difficulty managing your finances? No    Do you have any difficulty affording your medications? No    Social Determinants of Health        Financial Resource Strain: Not on file  Food Insecurity: No Food Insecurity (11/10/2021)    Hunger Vital Sign     Worried About Running Out of Food in the Last Year: Never true     Ran Out of Food in the Last Year: Never true  Transportation Needs: No Transportation Needs (11/10/2021)    PRAPARE - Armed forces logistics/support/administrative officer (Medical): No     Lack of Transportation (Non-Medical): No  Physical Activity: Not on file  Stress: Not on file  Social Connections: Not on file  Intimate Partner Violence: Not on file      No Known Allergies         Current Outpatient Medications  Medication Sig Dispense Refill   amoxicillin-clavulanate (AUGMENTIN) 875-125 MG tablet Take 1 tablet by mouth 2 (two) times daily.       APPLE CIDER VINEGAR PO Take 30 mLs by mouth 4 (four) times a week.       clopidogrel (PLAVIX) 75 MG tablet Take 1 tablet (75 mg total) by mouth daily. 30 tablet 11   diclofenac Sodium (VOLTAREN) 1 % GEL Apply 1 Application topically 4 (four) times daily as needed (pain).       empagliflozin (JARDIANCE) 10 MG TABS tablet TAKE 1 TABLET(10 MG)  BY MOUTH DAILY 30 tablet 5   ENTRESTO 49-51 MG TAKE 1 TABLET BY MOUTH TWICE DAILY 180 tablet 2   furosemide (LASIX) 20 MG tablet TAKE 1 TABLET(20 MG) BY MOUTH TWICE DAILY 60 tablet 0   insulin glargine (LANTUS) 100 UNIT/ML injection INJECT 8 UNITS UNDER THE SKIN DAILY 10 mL 11   Insulin Syringe-Needle U-100 (INSULIN SYRINGE 1CC/30GX5/16") 30G X 5/16" 1 ML MISC Use once daily for insulin injections. Dx:E11.22 100 each 1   metoprolol succinate (TOPROL XL) 25 MG 24 hr tablet Take 1 tablet (25 mg total) by mouth daily. 30 tablet 11   Misc Natural Products (JOINT SUPPORT PO) Take 1 Dose by mouth 3 (three) times a week.       spironolactone (ALDACTONE) 25 MG tablet Take 0.5 tablets (12.5 mg total) by mouth daily. 45 tablet 3   warfarin (COUMADIN) 6 MG tablet Take 0.5-1 tablets (3-6 mg total) by mouth See admin instructions. Take 6 mg on Mon and Fri Take 3 mg on Sun, Tues, Wed, Thurs, and Sat 30  tablet 6   blood glucose meter kit and supplies Dispense based on patient and insurance preference. Use up to four times daily as directed. (FOR ICD-10 E10.9, E11.9). (Patient not taking: Reported on 08/06/2022) 1 each 0   doxycycline (VIBRA-TABS) 100 MG tablet Take 100 mg by mouth 2 (two) times daily. (Patient not taking: Reported on 11/24/2022)       methocarbamol (ROBAXIN) 500 MG tablet Take 1 tablet (500 mg total) by mouth every 8 (eight) hours as needed for muscle spasms. (Patient not taking: Reported on 11/24/2022) 50 tablet 1   traMADol (ULTRAM) 50 MG tablet Take 1 tablet (50 mg total) by mouth every 6 (six) hours as needed for moderate pain. (Patient not taking: Reported on 11/24/2022) 20 tablet 0    No current facility-administered medications for this visit.      REVIEW OF SYSTEMS:  [X]  denotes positive finding, [ ]  denotes negative finding Cardiac   Comments:  Chest pain or chest pressure:      Shortness of breath upon exertion:      Short of breath when lying flat:      Irregular heart rhythm:              Vascular      Pain in calf, thigh, or hip brought on by ambulation:      Pain in feet at night that wakes you up from your sleep:       Blood clot in your veins:      Leg swelling:              Pulmonary      Oxygen at home:      Productive cough:       Wheezing:              Neurologic      Sudden weakness in arms or legs:       Sudden numbness in arms or legs:       Sudden onset of difficulty speaking or slurred speech:      Temporary loss of vision in one eye:       Problems with dizziness:              Gastrointestinal      Blood in stool:       Vomited blood:              Genitourinary      Burning when urinating:       Blood in urine:             Psychiatric      Major depression:              Hematologic      Bleeding problems:      Problems with blood clotting too easily:             Skin      Rashes or ulcers:             Constitutional      Fever or chills:          PHYSICAL EXAM:    Vitals:    11/24/22 1447  BP: (!) 140/82  Pulse: (!) 126  Resp: 16  Temp: 97.9 F (36.6 C)  TempSrc: Temporal  SpO2: 95%  Weight: 206 lb (93.4 kg)  Height: 5\' 8"  (1.727 m)      GENERAL: The patient is a well-nourished male, in no acute distress. The vital signs are documented  above. CARDIAC: There is a regular rate and rhythm.  VASCULAR:  [name] feet wrapped today from the wound clinic.  This was not removed at family request. PULMONARY: No respiratory distress.   DATA:    ABIs are noncompressible, pulsatile toe tracings bilaterally   Assessment/Plan:   72 year old male that most recently underwent right lower extremity arteriogram on 06/11/22 requiring retrograde right PT angioplasty for CLI with tissue loss that presents for 3 month follow-up and wound check.  Sounds like the right leg is making good progress.  There is some discussion about hyperbarics.  Now evidence of osteomyelitis of the left fifth toe.  We previously performed left peroneal  angioplasty in 2022 with a left 5th toe amputation that had healed in the past.  I think it is reasonable to consider repeat angiogram with a focus on the left leg given progression of the tissue loss with new bone infection.  I will get this scheduled in [facility] Lab [facility].  Risks benefits discussed.   Cephus Shelling, MD [facility] Office: [phone]

## 2022-12-10 NOTE — Op Note (Signed)
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  Ultrasound-guided access right common femoral artery 2.  CO2 aortogram with catheter selection of aorta 3.  Left lower extremity arteriogram with selection of third order branches 4.  Left peroneal angioplasty (3 mm x 80 mm Sterling) 5.  Mynx closure of the right common femoral artery 6.  51 minutes of monitored moderate conscious sedation time  Indications: 72 year old male well-known to vascular surgery that has had bilateral lower extremity tibial interventions for tissue loss.  He has evidence of new osteomyelitis at the site of a previous left fifth toe amputation.  He presents today for lower extremity arteriogram with a focus on the left leg after risks benefits discussed.  Findings: CO2 aortogram showed no flow-limiting stenosis in the aortoiliac segment.  Both renal arteries were patent though limited evaluation with CO2.  Left lower extremity arteriogram showed a widely patent common femoral, profunda, SFA, above and below-knee popliteal artery.  He has single vessel tibial runoff in the peroneal artery.  He had high-grade greater than 80% stenosis in the mid peroneal artery.  Distally this reconstitutes the posterior tibial at the ankle through collaterals with filling of the plantar arch.  The left peroneal stenosis was crossed antegrade from contralateral groin access.  This was treated with a 3 mm x 80 mm Sterling to nominal pressure for 2 minutes.  Excellent results with widely patent vessel and no residual stenosis and preserved runoff.   Procedure:  The patient was identified in the holding area and taken to room 8.  The patient was then placed supine on the table and prepped and  draped in the usual sterile fashion.  A time out was called.  Patient received Versed and fentanyl for moderate sedation.  Vital signs were monitored including heart rate respiratory rate blood pressure and oxygenation.  I was present for all of moderate sedation.  Ultrasound was used to evaluate the right common femoral artery.  It was patent .  A digital ultrasound image was acquired.  A micropuncture needle was used to access the right common femoral artery under ultrasound guidance.  An 018 wire was advanced without resistance and a micropuncture sheath was placed.  The 018 wire was removed and a benson wire was placed.  The micropuncture sheath was exchanged for a 5 french sheath.  An omniflush catheter was advanced over the wire to the level of L-1.  An abdominal angiogram was obtained with CO2.  Next, using the omniflush catheter and a benson wire, the aortic bifurcation was crossed and the catheter was placed into theleft external iliac artery and left runoff was obtained.  After evaluating images elected to intervene on the left peroneal high-grade stenosis in the mid vessel in the setting of single-vessel runoff.  I gave the patient 100 units/kg IV heparin.  I placed a long 5 French sheath in the right groin over the aortic bifurcation into the left SFA.  I then used a V18 to get down the peroneal antegrade and crossed the stenosis.  We got a planning angiogram with contrast.  The peroneal artery stenosis was then treated with a 3 mm x 80 mm Sterling to nominal pressure for 2 minutes.  200 mcg of nitro was given.  Final imaging showed excellent results  with no significant residual stenosis and preserved runoff in the peroneal with filling of the posterior tibial at the ankle consistent with his preop imaging.  Wires and catheters were removed.  A short 5 French sheath was placed in the right groin.  I used a mynx closure device in the right groin.  Plan: Patient is optimized from a vascular standpoint  after intervention on left peroneal artery high grade stenosis in setting of single-vessel tibial runoff.  He is on Plavix and can resume his Coumadin.  He refuses statin.  Will see him in 1 month with noninvasive imaging.   Marty Heck, MD [facility] [place] Office: [phone]

## 2022-12-14 ENCOUNTER — Other Ambulatory Visit: Payer: Self-pay | Admitting: Internal Medicine

## 2022-12-14 ENCOUNTER — Encounter: Payer: Self-pay | Admitting: Internal Medicine

## 2022-12-14 ENCOUNTER — Ambulatory Visit (HOSPITAL_COMMUNITY)
Admission: RE | Admit: 2022-12-14 | Discharge: 2022-12-14 | Disposition: A | Discharge status: Home / Self Care | Payer: Medicare HMO | Source: Ambulatory Visit | Attending: Internal Medicine | Admitting: Internal Medicine

## 2022-12-14 ENCOUNTER — Encounter (HOSPITAL_BASED_OUTPATIENT_CLINIC_OR_DEPARTMENT_OTHER): Payer: Medicare HMO | Admitting: Internal Medicine

## 2022-12-14 DIAGNOSIS — R768 Other specified abnormal immunological findings in serum: Secondary | ICD-10-CM

## 2022-12-14 DIAGNOSIS — L97529 Non-pressure chronic ulcer of other part of left foot with unspecified severity: Secondary | ICD-10-CM | POA: Diagnosis not present

## 2022-12-14 DIAGNOSIS — Z452 Encounter for adjustment and management of vascular access device: Secondary | ICD-10-CM | POA: Diagnosis not present

## 2022-12-14 DIAGNOSIS — E1169 Type 2 diabetes mellitus with other specified complication: Secondary | ICD-10-CM | POA: Diagnosis not present

## 2022-12-14 DIAGNOSIS — I739 Peripheral vascular disease, unspecified: Secondary | ICD-10-CM

## 2022-12-14 DIAGNOSIS — E1151 Type 2 diabetes mellitus with diabetic peripheral angiopathy without gangrene: Secondary | ICD-10-CM | POA: Diagnosis not present

## 2022-12-14 DIAGNOSIS — M86472 Chronic osteomyelitis with draining sinus, left ankle and foot: Secondary | ICD-10-CM | POA: Insufficient documentation

## 2022-12-14 DIAGNOSIS — B9689 Other specified bacterial agents as the cause of diseases classified elsewhere: Secondary | ICD-10-CM | POA: Diagnosis not present

## 2022-12-14 DIAGNOSIS — B965 Pseudomonas (aeruginosa) (mallei) (pseudomallei) as the cause of diseases classified elsewhere: Secondary | ICD-10-CM | POA: Diagnosis not present

## 2022-12-14 DIAGNOSIS — I110 Hypertensive heart disease with heart failure: Secondary | ICD-10-CM | POA: Diagnosis not present

## 2022-12-14 DIAGNOSIS — Z5181 Encounter for therapeutic drug level monitoring: Secondary | ICD-10-CM

## 2022-12-14 DIAGNOSIS — M86172 Other acute osteomyelitis, left ankle and foot: Secondary | ICD-10-CM | POA: Diagnosis not present

## 2022-12-14 DIAGNOSIS — E11621 Type 2 diabetes mellitus with foot ulcer: Secondary | ICD-10-CM | POA: Diagnosis not present

## 2022-12-14 DIAGNOSIS — M86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, left ankle and foot: Secondary | ICD-10-CM | POA: Diagnosis not present

## 2022-12-14 DIAGNOSIS — I5022 Chronic systolic (congestive) heart failure: Secondary | ICD-10-CM | POA: Diagnosis not present

## 2022-12-14 DIAGNOSIS — E1122 Type 2 diabetes mellitus with diabetic chronic kidney disease: Secondary | ICD-10-CM

## 2022-12-14 MED ORDER — LIDOCAINE HCL 1 % IJ SOLN
INTRAMUSCULAR | Status: AC
  Administered 2022-12-14: 10 mL
  Filled 2022-12-14: qty 20

## 2022-12-14 MED ORDER — HEPARIN SOD (PORK) LOCK FLUSH 100 UNIT/ML IV SOLN
INTRAVENOUS | Status: AC
  Administered 2022-12-14: 500 [IU]
  Filled 2022-12-14: qty 5

## 2022-12-14 NOTE — Procedures (Signed)
PROCEDURE SUMMARY:  Successful placement of image-guided single lumen PICC line to the right basilic vein. Length 41 cm. Tip at lower SVC/RA. No complications. EBL  < 1 mL. Aspirated, flushed, heparin locked, capped, dressing applied.   Ready for immediate use.  Please see imaging section of Epic for full dictation.   [name] Nam PA-C 12/14/2022 9:49 AM

## 2022-12-15 ENCOUNTER — Encounter (HOSPITAL_BASED_OUTPATIENT_CLINIC_OR_DEPARTMENT_OTHER): Payer: Medicare HMO | Admitting: Internal Medicine

## 2022-12-15 ENCOUNTER — Encounter (HOSPITAL_BASED_OUTPATIENT_CLINIC_OR_DEPARTMENT_OTHER): Payer: Medicare HMO | Attending: Internal Medicine | Admitting: Internal Medicine

## 2022-12-15 DIAGNOSIS — Z833 Family history of diabetes mellitus: Secondary | ICD-10-CM | POA: Diagnosis not present

## 2022-12-15 DIAGNOSIS — G4733 Obstructive sleep apnea (adult) (pediatric): Secondary | ICD-10-CM | POA: Diagnosis not present

## 2022-12-15 DIAGNOSIS — L97813 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: Secondary | ICD-10-CM | POA: Insufficient documentation

## 2022-12-15 DIAGNOSIS — M86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, left ankle and foot: Secondary | ICD-10-CM

## 2022-12-15 DIAGNOSIS — L97518 Non-pressure chronic ulcer of other part of right foot with other specified severity: Secondary | ICD-10-CM | POA: Diagnosis not present

## 2022-12-15 DIAGNOSIS — L97522 Non-pressure chronic ulcer of other part of left foot with fat layer exposed: Secondary | ICD-10-CM | POA: Insufficient documentation

## 2022-12-15 DIAGNOSIS — E11621 Type 2 diabetes mellitus with foot ulcer: Secondary | ICD-10-CM | POA: Diagnosis not present

## 2022-12-15 DIAGNOSIS — E1151 Type 2 diabetes mellitus with diabetic peripheral angiopathy without gangrene: Secondary | ICD-10-CM | POA: Diagnosis not present

## 2022-12-15 DIAGNOSIS — I110 Hypertensive heart disease with heart failure: Secondary | ICD-10-CM | POA: Diagnosis not present

## 2022-12-15 DIAGNOSIS — E1169 Type 2 diabetes mellitus with other specified complication: Secondary | ICD-10-CM | POA: Diagnosis not present

## 2022-12-15 DIAGNOSIS — F17200 Nicotine dependence, unspecified, uncomplicated: Secondary | ICD-10-CM | POA: Diagnosis not present

## 2022-12-15 DIAGNOSIS — Z794 Long term (current) use of insulin: Secondary | ICD-10-CM | POA: Diagnosis not present

## 2022-12-15 DIAGNOSIS — I5022 Chronic systolic (congestive) heart failure: Secondary | ICD-10-CM | POA: Insufficient documentation

## 2022-12-15 DIAGNOSIS — Z8249 Family history of ischemic heart disease and other diseases of the circulatory system: Secondary | ICD-10-CM | POA: Diagnosis not present

## 2022-12-15 DIAGNOSIS — E1140 Type 2 diabetes mellitus with diabetic neuropathy, unspecified: Secondary | ICD-10-CM | POA: Insufficient documentation

## 2022-12-15 LAB — GLUCOSE, CAPILLARY
Glucose-Capillary: 132 mg/dL — ABNORMAL HIGH (ref 70–99)
Glucose-Capillary: 149 mg/dL — ABNORMAL HIGH (ref 70–99)

## 2022-12-15 NOTE — Progress Notes (Addendum)
[name], [name] ([id number]) [id number] Page 1 of 2 Visit Report for 12/15/2022 Arrival Information Details Patient Name: Date of Service: [name] [name] A. 12/15/2022 8:00 A M Medical Record Number: [id number] Patient Account Number: [id number] Date of Birth/Sex: Treating RN: Oct 22, 1951 (72 y.Darren. Ernestene Mention Primary Care Theophile Harvie: [name] Other Clinician: [name] Referring Rocquel Askren: Treating Bee Marchiano/Extender: [name] in Treatment: 21 Visit Information History Since Last Visit All ordered tests and consults were completed: Yes Patient Arrived: Cane Added or deleted any medications: No Arrival Time: 07:47 Any new allergies or adverse reactions: No Accompanied By: self Had a fall or experienced change in No Transfer Assistance: None activities of daily living that may affect Patient Identification Verified: Yes risk of falls: Secondary Verification Process Completed: Yes Signs or symptoms of abuse/neglect since last visito No Patient Requires Transmission-Based Precautions: No Hospitalized since last visit: No Patient Has Alerts: Yes Implantable device outside of the clinic excluding No Patient Alerts: Patient on Blood Thinner cellular tissue based products placed in the center since last visit: Pain Present Now: No Electronic Signature(s) Signed: 12/15/2022 12:38:45 PM By: [name] CHT EMT BS , , Entered By: [name] on 12/15/2022 12:38:44 -------------------------------------------------------------------------------- Encounter Discharge Information Details Patient Name: Date of Service: [name] [name] 12/15/2022 8:00 A M Medical Record Number: [id number] Patient Account Number: [id number] Date of Birth/Sex: Treating RN: [birth year] (72 y.Darren. Ernestene Mention Primary Care Moreen Piggott: [name] Other Clinician: [name] Referring Mirabelle Cyphers: Treating  Dessa Ledee/Extender: [name] in Treatment: 21 Encounter Discharge Information Items Discharge Condition: Stable Ambulatory Status: Cane Discharge Destination: Other (Note Required) Transportation: Other Accompanied By: daughter Schedule Follow-up Appointment: No Clinical Summary of Care: Notes Wound care appointment after treatment today Electronic Signature(s) Signed: 12/15/2022 1:41:04 PM By: [name] CHT EMT BS , , Entered By: [name] on 12/15/2022 13:41:04 [name] ([id number]) [id number] Page 2 of 2 -------------------------------------------------------------------------------- Vitals Details Patient Name: Date of Service: [name] [facility] A. 12/15/2022 8:00 A M Medical Record Number: [id number] Patient Account Number: [id number] Date of Birth/Sex: Treating RN: 1951-05-24 (72 y.Darren. Ernestene Mention Primary Care Soraiya Ahner: [name] Other Clinician: [name] Referring Laressa Bolinger: Treating Gara Kincade/Extender: [name] Ngetich, [name] in Treatment: 21 Vital Signs Time Taken: 09:04 Temperature (F): 98.1 Height (in): 68 Pulse (bpm): 98 Weight (lbs): 185 Respiratory Rate (breaths/min): 18 Body Mass Index (BMI): 28.1 Blood Pressure (mmHg): 129/84 Capillary Blood Glucose (mg/dl): 132 Reference Range: 80 - 120 mg / dl Electronic Signature(s) Signed: 12/15/2022 12:39:09 PM By: [name] CHT EMT BS , , Entered By: [name] on 12/15/2022 12:39:09

## 2022-12-15 NOTE — Progress Notes (Addendum)
ITHAN, TOUHEY [name] ([id number]) [id number] Page 1 of 2 Visit Report for 12/15/2022 HBO Details Patient Name: Date of Service: [name] [facility] [name]. 12/15/2022 8:00 [name]: [id number] Patient Account Number: [id number] Date of Birth/Sex: Treating RN: 1950-12-21 (72 y.Darren. [name] Primary Care [name]: [name] Other Clinician: Donavan [name] Referring [name]: Treating [name]/Extender: [name] in Treatment: 21 HBO Treatment Course Details Treatment Course Number: 1 Ordering [name]: [name] T Treatments Ordered: otal 40 HBO Treatment Start Date: 12/15/2022 HBO Indication: Diabetic Ulcer(s) of the Lower Extremity Standard/Conservative Wound Care tried and failed greater than or equal to 30 days Wound #2 Left, Plantar Foot HBO Treatment Details Treatment Number: 1 Patient Type: Outpatient Chamber Type: Monoplace Chamber Serial #: [id number] Treatment Protocol: 2.0 ATA with 90 minutes oxygen, with two 5 minute air breaks Treatment Details Compression Rate Down: 1.0 psi / minute De-Compression Rate Up: 1.0 psi / minute [name] breaks and breathing ir Compress Tx Pressure periods Decompress Decompress Begins Reached (leave unused spaces Begins Ends blank) Chamber Pressure (ATA 1 2 2 2 2 2  --2 1 ) Clock Time (24 hr) 09:15 09:29 10:01 10:05 10:34 10:39 - - 11:09 11:26 Treatment Length: 131 (minutes) Treatment Segments: 4 Vital Signs Capillary Blood Glucose Reference Range: 80 - 120 mg / dl HBO Diabetic Blood Glucose Intervention Range: <131 mg/dl or >249 mg/dl Time Vitals Blood Respiratory Capillary Blood Glucose Pulse Action Type: Pulse: Temperature: Taken: Pressure: Rate: Glucose (mg/dl): Meter #: Oximetry (%) Taken: Pre 09:04 129/84 98 18 98.1 132 1 Post 11:29 144/82 57 18 97.9 149 1 Treatment Response Treatment Toleration: Well Treatment Completion Status: Treatment Completed  without Adverse Event Treatment Notes Patient arrived with daughter and blood glucose was measured at 132 mg/dL. Patient prepared for treatment. As precaution patient was given 8 oz Glucerna as he stated he had eaten [name] few fork-fuls of beans and rice this morning around 0700. Informed Dr. Heber Morgan [name] and Dr [name] elected to give patient two orange juice (4 oz). Cranberry juice was substituted as there was no orange juice available. I read and reviewed the consent with patient as he had not read/had it read to him prior to proceeding. After he confirmed agreement with the consent, I performed safety check and placed patient in the chamber. Chamber was compressed at 1 psi/min until reaching 2 ATA. Patient tolerated treatment well and decompression of chamber at 1 psi/min as well. Post-treatment blood glucose was 149 mg/dL. Patient was stable upon discharge, and had [name] wound care encounter scheduled after treatment. Additional Procedure Documentation Tissue Sevierity: Necrosis of bone Physician HBO Attestation: I certify that I supervised this HBO treatment in accordance with Medicare guidelines. [name] trained emergency response team is readily available per Yes hospital policies and procedures. Continue HBOT as ordered. Yes Electronic Signature(s) [name] ([id number]) [id number] Page 2 of 2 Signed: 12/15/2022 3:36:48 PM By: Darren Shan DO Previous Signature: 12/15/2022 1:39:06 PM Version By: [name] CHT EMT BS , , Previous Signature: 12/15/2022 1:42:51 PM Version By: Darren Shan DO Previous Signature: 12/15/2022 1:38:40 PM Version By: [name] CHT EMT BS , , Entered By: [name] on 12/15/2022 15:24:33 -------------------------------------------------------------------------------- HBO Safety Checklist Details Patient Name: Date of Service: [name] [facility] [name]. 12/15/2022 8:00 [name]: [id number] Patient Account Number:  [id number] Date of Birth/Sex: Treating RN: Nov 30, 1950 (17 y.Darren. [name] Primary Care [name]: [name] Other Clinician: Donavan [name] Referring [name]: Treating  [name]/Extender: [name] Ngetich, [name] in Treatment: 21 HBO Safety Checklist Items Safety Checklist Consent Form Signed Patient voided / foley secured and emptied When did you last eato 0700 Last dose of injectable or oral agent 0700 Ostomy pouch emptied and vented if applicable NA All implantable devices assessed, documented and approved NA Intravenous access site secured and place PICC Line R Arm Valuables secured Linens and cotton and cotton/polyester blend (less than 51% polyester) Personal oil-based products / skin lotions / body lotions removed Wigs or hairpieces removed NA Smoking or tobacco materials removed NA Books / newspapers / magazines / loose paper removed Cologne, aftershave, perfume and deodorant removed Jewelry removed (may wrap wedding band) Make-up removed NA Hair care products removed Battery operated devices (external) removed Heating patches and chemical warmers removed Titanium eyewear removed Nail polish cured greater than 10 hours NA Casting material cured greater than 10 hours NA Hearing aids removed NA Loose dentures or partials removed NA Prosthetics have been removed NA Patient demonstrates correct use of air break device (if applicable) Patient concerns have been addressed Patient grounding bracelet on and cord attached to chamber Specifics for Inpatients (complete in addition to above) Medication sheet sent with patient NA Intravenous medications needed or due during therapy sent with patient NA Drainage tubes (e.g. nasogastric tube or chest tube secured and vented) NA Endotracheal or Tracheotomy tube secured NA Cuff deflated of air and inflated with saline NA Airway suctioned NA Notes Paper version used prior to  treatment Electronic Signature(s) Signed: 12/15/2022 1:11:48 PM By: [name] CHT EMT BS , , Entered By: [name] on 12/15/2022 13:11:47

## 2022-12-15 NOTE — Progress Notes (Signed)
RYUN, VELEZ A ([id number]) [id number] Page 1 of 1 Visit Report for 12/15/2022 SuperBill Details Patient Name: Date of Service: [name] [facility] A. 12/15/2022 Medical Record Number: [id number] Patient Account Number: [id number] Date of Birth/Sex: Treating RN: November 17, 1950 (72 y.o. 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, left ankle and foot Facility Procedures CPT4 Code Description Modifier Quantity [id number] G0277-(Facility Use Only) HBOT full body chamber, 12min , 4 ICD-10 Diagnosis Description E11.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

## 2022-12-16 ENCOUNTER — Encounter (HOSPITAL_BASED_OUTPATIENT_CLINIC_OR_DEPARTMENT_OTHER): Payer: Medicare HMO | Admitting: Internal Medicine

## 2022-12-16 DIAGNOSIS — I110 Hypertensive heart disease with heart failure: Secondary | ICD-10-CM | POA: Diagnosis not present

## 2022-12-16 DIAGNOSIS — E1169 Type 2 diabetes mellitus with other specified complication: Secondary | ICD-10-CM | POA: Diagnosis not present

## 2022-12-16 DIAGNOSIS — Z794 Long term (current) use of insulin: Secondary | ICD-10-CM | POA: Diagnosis not present

## 2022-12-16 DIAGNOSIS — E1151 Type 2 diabetes mellitus with diabetic peripheral angiopathy without gangrene: Secondary | ICD-10-CM | POA: Diagnosis not present

## 2022-12-16 DIAGNOSIS — M86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, left ankle and foot: Secondary | ICD-10-CM | POA: Diagnosis not present

## 2022-12-16 DIAGNOSIS — L97522 Non-pressure chronic ulcer of other part of left foot with fat layer exposed: Secondary | ICD-10-CM | POA: Diagnosis not present

## 2022-12-16 DIAGNOSIS — L97518 Non-pressure chronic ulcer of other part of right foot with other specified severity: Secondary | ICD-10-CM | POA: Diagnosis not present

## 2022-12-16 DIAGNOSIS — L97813 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: Secondary | ICD-10-CM | POA: Diagnosis not present

## 2022-12-16 DIAGNOSIS — E11621 Type 2 diabetes mellitus with foot ulcer: Secondary | ICD-10-CM | POA: Diagnosis not present

## 2022-12-16 DIAGNOSIS — L97524 Non-pressure chronic ulcer of other part of left foot with necrosis of bone: Secondary | ICD-10-CM | POA: Diagnosis not present

## 2022-12-16 LAB — GLUCOSE, CAPILLARY
Glucose-Capillary: 125 mg/dL — ABNORMAL HIGH (ref 70–99)
Glucose-Capillary: 149 mg/dL — ABNORMAL HIGH (ref 70–99)
Glucose-Capillary: 140 mg/dL — ABNORMAL HIGH (ref 70–99)

## 2022-12-16 NOTE — Progress Notes (Signed)
DREW, LIPS A ([id number]) [id number] Page 1 of 1 Visit Report for 12/16/2022 SuperBill Details Patient Name: Date of Service: [name] [facility] A. 12/16/2022 Medical Record Number: [id number] Patient Account Number: [id number] Date of Birth/Sex: Treating RN: 19-Aug-1951 (72 y.o. 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, left ankle and foot Facility Procedures CPT4 Code Description Modifier Quantity [id number] G0277-(Facility Use Only) HBOT full body chamber, 70min , 4 ICD-10 Diagnosis Description E11.621 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

## 2022-12-16 NOTE — Progress Notes (Signed)
DARROW, BARREIRO A ([id number]) [id number] Page 1 of 2 Visit Report for 12/16/2022 Arrival Information Details Patient Name: Date of Service: [name] [facility] A. 12/16/2022 8:00 A M Medical Record Number: [id number] Patient Account Number: [id number] Date of Birth/Sex: Treating RN: 01-30-1951 (72 y.o. [name] Primary Care Kahleb Mcclane: [name] Other Clinician: [name] Referring Ambika Zettlemoyer: Treating Vanya Carberry/Extender: Kathrene Alu in Treatment: 21 Visit Information History Since Last Visit All ordered tests and consults were completed: Yes Patient Arrived: [name] Added or deleted any medications: No Arrival Time: 07:42 Any new allergies or adverse reactions: No Accompanied By: daughter Had a fall or experienced change in No Transfer Assistance: None activities of daily living that may affect Patient Identification Verified: Yes risk of falls: Secondary Verification Process Completed: Yes Signs or symptoms of abuse/neglect since last visito No Patient Requires Transmission-Based Precautions: No Hospitalized since last visit: No Patient Has Alerts: Yes Implantable device outside of the clinic excluding No Patient Alerts: Patient on Blood Thinner cellular tissue based products placed in the center since last visit: Pain Present Now: No Electronic Signature(s) Signed: 12/16/2022 1:25:59 PM By: [name] CHT EMT BS , , Previous Signature: 12/16/2022 12:36:07 PM Version By: [name] CHT EMT BS , , Entered By: [name] on 12/16/2022 13:25:59 -------------------------------------------------------------------------------- Encounter Discharge Information Details Patient Name: Date of Service: [name] [name] [name] 12/16/2022 8:00 A M Medical Record Number: [id number] Patient Account Number: [id number] Date of Birth/Sex: Treating RN: [birth year] (72 y.o. [name] Primary Care Yovan Leeman:  [name] Other Clinician: [name] Referring Theador Jezewski: Treating Ernest Popowski/Extender: Kathrene Alu in Treatment: 21 Encounter Discharge Information Items Discharge Condition: Stable Ambulatory Status: Cane Discharge Destination: Home Transportation: Private Auto Accompanied By: daughter Schedule Follow-up Appointment: No Clinical Summary of Care: Electronic Signature(s) Signed: 12/16/2022 1:25:33 PM By: [name] CHT EMT BS , , Entered By: [name] on 12/16/2022 13:25:32 [name] ([id number]) [id number] Page 2 of 2 -------------------------------------------------------------------------------- Vitals Details Patient Name: Date of Service: [name] [facility] A. 12/16/2022 8:00 A M Medical Record Number: [id number] Patient Account Number: [id number] Date of Birth/Sex: Treating RN: 06-04-1951 (72 y.o. [name] Primary Care Sondra Blixt: [name] Other Clinician: [name] Referring Karlena Luebke: Treating Chrisette Man/Extender: Kathrene Alu in Treatment: 21 Vital Signs Time Taken: 08:26 Temperature (F): 98.3 Height (in): 68 Pulse (bpm): 98 Weight (lbs): 185 Respiratory Rate (breaths/min): 18 Body Mass Index (BMI): 28.1 Blood Pressure (mmHg): 154/80 Capillary Blood Glucose (mg/dl): 125 Reference Range: 80 - 120 mg / dl Electronic Signature(s) Signed: 12/16/2022 12:36:45 PM By: [name] CHT EMT BS , , Entered By: [name] on 12/16/2022 12:36:45

## 2022-12-16 NOTE — Progress Notes (Addendum)
[name], [name] ([id number]) [id number] Page 1 of 13 Visit Report for 12/15/2022 Chief Complaint Document Details Patient Name: Date of Service: [name] [facility] A. 12/15/2022 1:30 PM Medical Record Number: [id number] Patient Account Number: [id number] Date of Birth/Sex: Treating RN: 1951/10/20 (72 y.o. M) Primary Care Provider: [name] Other Clinician: Referring Provider: Treating Provider/Extender: [name], [name] in Treatment: 21 Information Obtained from: Patient Chief Complaint 07/16/2022; bilateral lower extremity wounds Electronic Signature(s) Signed: 12/15/2022 1:42:51 PM By: [name] DO Entered By: [name] on 12/15/2022 13:35:40 -------------------------------------------------------------------------------- Cellular or Tissue Based Product Details Patient Name: Date of Service: [name] [name] [facility] A. 12/15/2022 1:30 PM Medical Record Number: [id number] Patient Account Number: [id number] Date of Birth/Sex: Treating RN: [birth year] (72 y.o. [name] Primary Care Provider: [name] Other Clinician: Referring Provider: Treating Provider/Extender: [name] in Treatment: 21 Cellular or Tissue Based Product Type Wound #1 Right Achilles Applied to: Performed By: Physician [name], DO Cellular or Tissue Based Product Type: Grafix prime Level of Consciousness (Pre-procedure): Awake and Alert Pre-procedure Verification/Time Out Yes - 13:20 Taken: Location: trunk / arms / legs Wound Size (sq cm): 0.01 Product Size (sq cm): 2 Waste Size (sq cm): 0 Amount of Product Applied (sq cm): 2 Lot #: [id number] Order #: 10 Expiration Date: 01/20/2024 Fenestrated: No Reconstituted: Yes Solution Type: normal saline Solution Amount: 10 mL Lot #: [id number] Solution Expiration Date: 03/08/2025 Secured: Yes Secured With: Steri-Strips, adaptic Dressing Applied:  No Procedural Pain: 0 Post Procedural Pain: 0 Response to Treatment: Procedure was tolerated well Level of Consciousness (Post- Awake and Alert procedure): YUEPHENG, SCHLEETER A ([id number]) [phone].pdf Page 2 of 13 Post Procedure Diagnosis Same as Pre-procedure Electronic Signature(s) Signed: 12/15/2022 1:42:51 PM By: [name] DO Signed: 12/15/2022 6:17:04 PM By: Deon Pilling RN, BSN Entered By: Deon Pilling on 12/15/2022 13:31:15 -------------------------------------------------------------------------------- Debridement Details Patient Name: Date of Service: [name] [name] 12/15/2022 1:30 PM Medical Record Number: [id number] Patient Account Number: [id number] Date of Birth/Sex: Treating RN: 07/25/1951 (72 y.o. [name] Primary Care Provider: [name] Other Clinician: Referring Provider: Treating Provider/Extender: [name] Ngetich, [name] in Treatment: 21 Debridement Performed for Assessment: Wound #1 Right Achilles Performed By: Physician [name], DO Debridement Type: Debridement Severity of Tissue Pre Debridement: Fat layer exposed Level of Consciousness (Pre-procedure): Awake and Alert Pre-procedure Verification/Time Out Yes - 13:15 Taken: Start Time: 13:16 Pain Control: Lidocaine 4% T opical Solution T Area Debrided (L x W): otal 1 (cm) x 1 (cm) = 1 (cm) Tissue and other material debrided: Viable, Non-Viable, Callus, Eschar, Subcutaneous, Skin: Dermis , Skin: Epidermis Level: Skin/Subcutaneous Tissue Debridement Description: Excisional Instrument: Curette Bleeding: Minimum Hemostasis Achieved: Pressure End Time: 13:19 Response to Treatment: Procedure was tolerated well Level of Consciousness (Post- Awake and Alert procedure): Post Debridement Measurements of Total Wound Length: (cm) 1 Width: (cm) 0.2 Depth: (cm) 0.1 Volume: (cm) 0.016 Character of Wound/Ulcer Post Debridement:  Improved Severity of Tissue Post Debridement: Fat layer exposed Post Procedure Diagnosis Same as Pre-procedure Electronic Signature(s) Signed: 12/15/2022 1:42:51 PM By: [name] DO Signed: 12/15/2022 6:17:04 PM By: Deon Pilling RN, BSN Entered By: Deon Pilling on 12/15/2022 13:29:10 -------------------------------------------------------------------------------- HPI Details Patient Name: Date of Service: [name] [name] 12/15/2022 1:30 PM Medical Record Number: [id number] Patient Account Number: [id number] Date of Birth/Sex: Treating RN: [birth year] (72 y.o. Mitchel [name], Governor Rooks ([id number]) [id number] Page 3 of 13 Primary Care Provider: Ngetich,  Dinah Other Clinician: Referring Provider: Treating Provider/Extender: [name], [name] in Treatment: 21 History of Present Illness HPI Description: Admission 07/16/2022 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 He is not sure how the wounds started. He has been following with podiatry for this issue. He had removal of the tendon to the right lower extremity by Dr. SBlenda Mounts He has been using Betadine to the wound beds. On 06/11/2022 he had a right posterior tibial artery angioplasty by Dr. CCarlis Abbott It was reported the patient is optimized after revascularization of the right lower extremity. His ABIs on the left were 0.91. He currently denies systemic signs of infection. He also does not wear shoes. He came in with Kerlix wrap to his feet bilaterally. This did not completely cover his feet. 07/23/2022: This is a patient of  Dr. HJodene Namthat she asked me to take a look at last week due to the significant involvement of the muscle and tendon on his right posterior leg wound. He needed an aggressive debridement and asked me if I would be able to perform this on her behalf. The patient is here today for that procedure. When his dressing was removed in clinic today, the wound was teeming with maggots. There is necrotic muscle, tendon, and fat, with a thick layer of slough on the wound. 9/21; patient presents for follow-up. He was debrided by Dr. CCeline Ahrat last clinic visit without any issues. He has been placing Dakin's wet-to-dry dressings to the right posterior wound. He has been using Medihoney to the left foot wound. He reports improvement in wound healing. He has no issues or complaints today. 9/28; patient with type 2 diabetes PAD status post revascularization. He underwent retrograde right PTA angioplasty. Last saw Dr. CCarlis Abbotton 9/12 and was felt to have a brisk posterior tibial signal at the right ankle. He had a pulsatile toe tracing that appeared adequate for healing. He has been using Santyl and Hydrofera Blue on the right Achilles area. He has a smaller area on the left fifth plantar metatarsal head They were apparently in the ER on 9/23 with swelling and discoloration above the wrap. They have a picture of the leg after the wrap was taken off which looks like that it was excessively tight superiorly 10/6; patient presents for follow-up. We have been using Santyl and Hydrofera Blue to the right lower extremity wound under Kerlix/Coban. T the left foot we o have been using silver alginate with Medihoney. He again comes in with no shoes. He has no issues or complaints today. 10/12; patient presents for follow-up. We have been using Santyl and Hydrofera Blue to the right lower extremity under Kerlix/Coban And silver alginate to the left foot wound. He has no issues today. He denies signs of infection. 10/19;  patient presents for follow-up. We continue to use Santyl and Hydrofera Blue to the right lower extremity under Kerlix/Coban and silver alginate to the left foot wound. There is been improvement in wound healing. 10/26; patient presents for follow-up. We have been using Santyl and Hydrofera Blue to the right lower extremity under Kerlix/Coban and silver alginate to the left foot. There continues to be  improvement in wound healing. Patient has no issues or complaints today. 11/2; the patient's area on the right Achilles heel looks quite healthy and is improved in terms of measurements. We have been using Hydrofera Blue. He is approved for Grafix On the left plantar foot wound over the fifth metatarsal head this tunnels over the lateral part of the met head. He is not offloading this at all 11/16; patient presents for follow-up. He has been using a surgical shoe with an offloading felt pad to the left lateral foot along with silver alginate. He has been approved for Grafix and this is available for placement today T the right lower extremity wound. Patient Is agreeable to this. We have been using Santyl and o Hydrofera Blue under Kerlix/Coban to this area. 11/21; patient presents for follow-up. We have been doing Grafix to the right lower extremity and silver alginate to the left foot wound. He has no issues or complaints today. 11/30; patient presents for follow-up. We have been doing Grafix to the right lower extremity wound and Medihoney with Hydrofera Blue to the left lateral foot wound. He reports pain to the left foot wound today. He did not obtain his x-ray. 12/11; patient presents for follow-up. He has been using Dakin's wet-to-dry dressings to the left plantar foot wound. We have been doing Grafix to the right lower extremity wound under compression therapy. He obtained his x-ray that showed mild periosteal elevation at the resection site with the possibility of osteomyelitis. He currently  denies systemic signs of infection. He has been taking doxycycline and Augmentin without issues. He is getting his INR checked by his primary care office. 12/18; patient presents for follow-up. He has been using Dakin's wet-to-dry dressings to the left foot wound. We have been placing Grafix under compression therapy to the right lower extremity. He is currently taking doxycycline and Augmentin. He denies signs of infection. 12/26; patient presents for follow-up. He has been using Dakin's wet-to-dry dressings to the left foot. He has been off oral antibiotics for potential bone biopsy. We have been placing Grafix under compression therapy to the right lower extremity. There has been improvement in wound healing to both sites. He states he is trying to aggressively offload the left foot. He currently denies signs of infection. 1/2; patient presents for follow-up. He has been doing Dakin's wet-to-dry dressings to the left foot. He saw Dr. Blenda Mounts today and had a bone biopsy. Debridement was performed on the site. Dressing in place with Dakin's wet-to-dry. T the right leg we have been using Grafix under compression therapy. He o has no issues or complaints today. 1/9; patient presents for follow-up. We have been using Hydrofera Blue under compression therapy to the right lower extremity wound. This is well-healing. He has been using Dakin's wet-to-dry dressings to the left lateral foot wound. He has been taking ciprofloxacin due to culture results without issues. He was referred to infectious disease by podiatry. He does not have an appointment yet. 1/16; patient presents for follow-up. We were using collagen under Kerlix/Coban to the right lower extremity wound. He has been approved for Grafix and this was available for placement today. He continues to use Dakin's wet-to-dry dressings daily to the left foot. He started levofloxacin. He has an appointment with infectious disease on 1/24. 1/23; patient  presents for follow-up. Grafix was placed in standard fashion to the right lower extremity wound last week. He has been using Dakin's wet-to-dry dressings to the left foot. He has been taking  levofloxacin. He has an infectious disease appointment tomorrow as well as a pulmonology appointment. He followed up with vein and vascular on 1/16 and plan is for angiogram of the left lower extremity. He has a history of left peroneal angioplasty in 2022. 1/30; patient presents for follow-up. Grafix was placed in standard fashion to the right lower extremity wound last week. There has been improvement in healing here. He has been using Dakin's wet-to-dry dressings to the left foot. Patient saw infectious disease, Dr. Juleen China on 1/24 and plan is for PICC line on 2/5 with MITCHEL, BLACHE A ([id number]) [id number] Page 4 of 13 cefepime. For now he is taking levofloxacin. He has been cleared by pulmonology for HBO. Due to extensive heart history we have asked HBO clearance from his cardiologist as well. He has follow-up with them tomorrow. He currently has no issues or complaints today. 2/6; patient presents for follow-up. He started HBO therapy today and had no issues with his dive. He had an arteriogram on 2/1 by Dr. Carlis Abbott. He had angioplasty of the left peroneal artery. He is optimized from a vascular standpoint. We have been using Grafix to the right lower extremity wound and Dakin's wet-to-dry dressings to the left foot wound. He started his IV antibiotics yesterday. Electronic Signature(s) Signed: 12/15/2022 1:42:51 PM By: [name] DO Entered By: [name] on 12/15/2022 13:37:19 -------------------------------------------------------------------------------- Physical Exam Details Patient Name: Date of Service: [name] [name] [facility] A. 12/15/2022 1:30 PM Medical Record Number: [id number] Patient Account Number: [id number] Date of Birth/Sex: Treating RN: 09/28/1951 (72  y.o. M) Primary Care Provider: [name] Other Clinician: Referring Provider: Treating Provider/Extender: [name] Ngetich, [name] in Treatment: 21 Constitutional respirations regular, non-labored and within target range for patient.. Cardiovascular 2+ dorsalis pedis/posterior tibialis pulses. Psychiatric pleasant and cooperative. Notes Right lower extremity: T the posterior aspect there is an open wound with granulation tissue and nonviable tissue. Venous stasis dermatitis. T the left lower o o extremity there is An open wound that undermines and probes to bone. No surrounding signs of soft tissue infection. Electronic Signature(s) Signed: 12/15/2022 1:42:51 PM By: [name] DO Entered By: [name] on 12/15/2022 13:37:44 -------------------------------------------------------------------------------- Physician Orders Details Patient Name: Date of Service: [name] [facility] A. 12/15/2022 1:30 PM Medical Record Number: [id number] Patient Account Number: [id number] Date of Birth/Sex: Treating RN: Sep 22, 1951 (72 y.o. 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 - w/ Dr. Heber [place] Tuesday 12/22/22 @ 12:45 Rm  # 8 keep appt time once hyperbaric is done he will come to clinic Return A and be added in. ppointment in 2 weeks. - Dr. Heber [place] Tuesday 0930 12/29/2022 keep appt time. after hyperbarics will add you into clinic. Return A Return appointment in 3 weeks. - Dr. Heber [place] Tuesday 1015 01/05/2023 keep appt time. after hyperbarics will add you into clinic. Other: - keep Pulmonologist appt at end of  February. Continue oral antibiotics till you see Infectious Disease. Ensure to see Vein and Vascular today for follow up tests. Anesthetic (In clinic) Topical Lidocaine 5% applied to wound bed Cellular or Tissue Based Products Wound #1 Right Achilles Cellular or Tissue Based Product Type: - Grafix approved-waiting to hear about copay or coinsurance-cost for you out of your pocket. Kerecsis denied by insurance. Grafix #1 applied on 09/24/22 Grafix #2 applied on 09/29/2022 Grafix # 3 applied on 10/07/22 Grafix # 4 applied on 10/19/22 Grafix # 5 applied 10/26/2022 Grafix applied to right achilles wound 11/03/2022 Grafix pending insurance approval 11/24/2022 Grafix approved from now to 02/14/2023 total 12. #7 applied. Grafix # 8 applied 12/01/22 Grafix # 9 applied 12/08/22 Grafix #10 applied to 12/15/2022 Cellular or Tissue Based Product applied to wound bed, secured with steri-strips, cover with Adaptic or Mepitel. (DO NOT REMOVE). Edema Control - Lymphedema / SCD / Other Avoid standing for long periods of time. Off-Loading Open toe surgical shoe to: - ****put PegAssist in surgical shoe.**** Prevalon Boot - left foot while resting in bed at night. Other: - minimize walking and standing to aid in offloading pressure to wound. Hyperbaric Oxygen Therapy Wound #2 Left,Plantar Foot Evaluate for HBO Therapy Indication: - Wagner grade 3 If appropriate for treatment, begin HBOT per protocol: 2.0 ATA for 90 Minutes with 2 Five (5) Minute A Breaks ir Total Number of Treatments: - 40 One treatments per day  (delivered Monday through Friday unless otherwise specified in Special Instructions below): Finger stick Blood Glucose Pre- and Post- HBOT Treatment. Follow Hyperbaric Oxygen Glycemia Protocol A frin (Oxymetazoline HCL) 0.05% nasal spray - 1 spray in both nostrils daily as needed prior to HBO treatment for difficulty clearing ears Wound Treatment Wound #1 - Achilles Wound Laterality: Right Cleanser: Soap and Water 1 x Per Week/30 Days Discharge Instructions: May shower and wash wound with dial antibacterial soap and water prior to dressing change. Cleanser: Wound Cleanser 1 x Per Week/30 Days Discharge Instructions: Cleanse the wound with wound cleanser prior to applying a clean dressing using gauze sponges, not tissue or cotton balls. Peri-Wound Care: Sween Lotion (Moisturizing lotion) 1 x Per Week/30 Days Discharge Instructions: Apply moisturizing lotion as directed Prim Dressing: Grafix #9 1 x Per Week/30 Days ary Discharge Instructions: applied by provider. Prim Dressing: adpatic and steri-strips 1 x Per Week/30 Days ary Discharge Instructions: to secure grafix in place. do not remove. Secondary Dressing: ABD Pad, 5x9 1 x Per Week/30 Days Discharge Instructions: Apply over primary dressing as directed. Secondary Dressing: Woven Gauze Sponge, Non-Sterile 4x4 in 1 x Per Week/30 Days Discharge Instructions: Apply over primary dressing as directed. Compression Wrap: Kerlix Roll 4.5x3.1 (in/yd) 1 x Per Week/30 Days Discharge Instructions: Apply Kerlix and Coban compression as directed. Compression Wrap: Coban Self-Adherent Wrap 4x5 (in/yd) 1 x Per Week/30 Days Discharge Instructions: Apply over Kerlix as directed. Wound #2 - Foot Wound Laterality: Plantar, Left SAGE, MON A ([id number]) [id number] Page 6 of 13 Cleanser: Wound Cleanser 1 x Per Day/30 Days Discharge Instructions: Cleanse the wound with wound cleanser prior to applying a clean dressing using  gauze sponges, not tissue or cotton balls. Peri-Wound Care: Skin Prep (Generic) 1 x Per Day/30 Days Discharge Instructions: Use skin prep as directed Prim Dressing: Dakin's Solution 0.25%, 16 (oz) 1 x Per Day/30 Days ary Discharge Instructions: Moisten gauze with Dakin's solution Secondary Dressing: Optifoam Non-Adhesive Dressing, 4x4 in (Generic) 1 x Per Day/30 Days Discharge Instructions: Apply over primary dressing as directed. Secondary  Dressing: Woven Gauze Sponge, Non-Sterile 4x4 in (Generic) 1 x Per Day/30 Days Discharge Instructions: Apply over primary dressing as directed. Secondary Dressing: Woven Gauze Sponges 2x2 in (Generic) 1 x Per Day/30 Days Discharge Instructions: Apply over primary dressing as directed. Secured With: Child psychotherapist, Sterile 2x75 (in/in) (Generic) 1 x Per Day/30 Days Discharge Instructions: Secure with stretch gauze as directed. Secured With: 8M Medipore H Soft Cloth Surgical T ape, 4 x 10 (in/yd) (Generic) 1 x Per Day/30 Days Discharge Instructions: Secure with tape as directed. Patient Medications llergies: No Known Drug Allergies A Notifications Medication Indication Start End 12/15/2022 Dakin's Solution DOSE 1 - miscellaneous 0.25 % solution - Moisten gauze for wet to dry dressings GLYCEMIA INTERVENTIONS PROTOCOL PRE-HBO GLYCEMIA INTERVENTIONS ACTION INTERVENTION Obtain pre-HBO capillary blood glucose (ensure 1 physician order is in chart). A. Notify HBO physician and await physician orders. 2 If result is 70 mg/dl or below: B. If the result meets the hospital definition of a critical result, follow hospital policy. A. Give patient an 8 ounce Glucerna Shake, an 8 ounce Ensure, or 8 ounces of a Glucerna/Ensure equivalent dietary supplement*. B. Wait 30 minutes. If result is 71 mg/dl to 130 mg/dl: C. Retest patients capillary blood glucose (CBG). D. If result greater than or equal to 110 mg/dl, proceed with HBO. If  result less than 110 mg/dl, notify HBO physician and consider holding HBO. If result is 131 mg/dl to 249 mg/dl: A. Proceed with HBO. A. Notify HBO physician and await physician orders. B. It is recommended to hold HBO and do If result is 250 mg/dl or greater: blood/urine ketone testing. C. If the result meets the hospital definition of a critical result, follow hospital policy. POST-HBO GLYCEMIA INTERVENTIONS ACTION INTERVENTION Obtain post HBO capillary blood glucose (ensure 1 physician order is in chart). A. Notify HBO physician and await physician orders. 2 If result is 70 mg/dl or below: B. If the result meets the hospital definition of a critical result, follow hospital policy. A. Give patient an 8 ounce Glucerna Shake, an 8 ounce Ensure, or 8 ounces of a Glucerna/Ensure equivalent dietary supplement*. B. Wait 15 minutes for symptoms of If result is 71 mg/dl to 100 mg/dl: hypoglycemia (i.e. nervousness, anxiety, sweating, chills, clamminess, irritability, confusion, tachycardia or dizziness). C. If patient asymptomatic, discharge patient. If patient symptomatic, repeat capillary blood glucose (CBG) and notify HBO physician. If result is 101 mg/dl to 249 mg/dl: A. Discharge patient. A. Notify HBO physician and await physician orders. KAHAAN, ALVIZO A ([id number]) [id number] Page 7 of 13 B. It is recommended to do blood/urine ketone If result is 250 mg/dl or greater: testing. C. If the result meets the hospital definition of a critical result, follow hospital policy. *Juice or candies are NOT equivalent products. If patient refuses the Glucerna or Ensure, please consult the hospital dietitian for an appropriate substitute. Electronic Signature(s) Signed: 12/15/2022 1:42:22 PM By: [name] DO Entered By: [name] on 12/15/2022  13:42:21 -------------------------------------------------------------------------------- Problem List Details Patient Name: Date of Service: [name] [name] [facility] A. 12/15/2022 1:30 PM Medical Record Number: [id number] Patient Account Number: [id number] Date of Birth/Sex: Treating RN: 23-Nov-1950 (72 y.o. [name] Primary Care Provider: [name] Other Clinician: Referring Provider: Treating Provider/Extender: [name], [name] in Treatment: 21 Active Problems ICD-10 Encounter Code Description Active Date MDM Diagnosis L97.518 Non-pressure chronic ulcer of other part of right foot with other specified 07/16/2022 No Yes severity L97.522 Non-pressure chronic ulcer of other part  of left foot with fat layer exposed 07/16/2022 No Yes E11.621 Type 2 diabetes mellitus with foot ulcer 07/16/2022 No Yes I73.9 Peripheral vascular disease, unspecified 07/16/2022 No Yes 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 07/23/2022 No Yes muscle M86.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, left ankle and foot 11/10/2022 No Yes Inactive Problems Resolved Problems Electronic Signature(s) Signed: 12/15/2022 1:42:51 PM By: [name] DO Entered By: [name] on 12/15/2022 13:35:01 [name] ([id number]) [phone].pdf Page 8 of 13 -------------------------------------------------------------------------------- Progress Note Details Patient Name: Date of Service: [name] A. 12/15/2022 1:30 PM Medical Record Number: [id number] Patient Account Number: [id number] Date of Birth/Sex: Treating RN: 12/16/1950 (72 y.o. M) Primary Care Provider: [name] Other Clinician: Referring Provider: Treating Provider/Extender: [name], [name] in Treatment: 21 Subjective Chief Complaint Information obtained from Patient 07/16/2022; bilateral lower extremity wounds History of Present  Illness (HPI) Admission 07/16/2022 Mr. Bronx Mcindoe is a 72 year old male with a past medical history of insulin-dependent currently controlled type 2 diabetes complicated by peripheral neuropathy, chronic systolic heart failure, obstructive sleep apnea, and peripheral vascular disease that presents to the clinic for a several month history of nonhealing ulcer to the back of the right leg and a 74-monthhistory of nonhealing wounds to the left foot. He is not sure how the wounds started. He has been following with podiatry for this issue. He had removal of the tendon to the right lower extremity by Dr. SBlenda Mounts He has been using Betadine to the wound beds. On 06/11/2022 he had a right posterior tibial artery angioplasty by Dr. CCarlis Abbott It was reported the patient is optimized after revascularization of the right lower extremity. His ABIs on the left were 0.91. He currently denies systemic signs of infection. He also does not wear shoes. He came in with Kerlix wrap to his feet bilaterally. This did not completely cover his feet. 07/23/2022: This is a patient of Dr. HJodene Namthat she asked me to take a look at last week due to the significant involvement of the muscle and tendon on his right posterior leg wound. He needed an aggressive debridement and asked me if I would be able to perform this on her behalf. The patient is here today for that procedure. When his dressing was removed in clinic today, the wound was teeming with maggots. There is necrotic muscle, tendon, and fat, with a thick layer of slough on the wound. 9/21; patient presents for follow-up. He was debrided by Dr. CCeline Ahrat last clinic visit without any issues. He has been placing Dakin's wet-to-dry dressings to the right posterior wound. He has been using Medihoney to the left foot wound. He reports improvement in wound healing. He has no issues or complaints today. 9/28; patient with type 2 diabetes PAD status post revascularization. He  underwent retrograde right PTA angioplasty. Last saw Dr. CCarlis Abbotton 9/12 and was felt to have a brisk posterior tibial signal at the right ankle. He had a pulsatile toe tracing that appeared adequate for healing. He has been using Santyl and Hydrofera Blue on the right Achilles area. oo He has a smaller area on the left fifth plantar metatarsal head They were apparently in the ER on 9/23 with swelling and discoloration above the wrap. They have a picture of the leg after the wrap was taken off which looks like that it was excessively tight superiorly 10/6; patient presents for follow-up. We have been using Santyl and Hydrofera Blue  to the right lower extremity wound under Kerlix/Coban. T the left foot we o have been using silver alginate with Medihoney. He again comes in with no shoes. He has no issues or complaints today. 10/12; patient presents for follow-up. We have been using Santyl and Hydrofera Blue to the right lower extremity under Kerlix/Coban And silver alginate to the left foot wound. He has no issues today. He denies signs of infection. 10/19; patient presents for follow-up. We continue to use Santyl and Hydrofera Blue to the right lower extremity under Kerlix/Coban and silver alginate to the left foot wound. There is been improvement in wound healing. 10/26; patient presents for follow-up. We have been using Santyl and Hydrofera Blue to the right lower extremity under Kerlix/Coban and silver alginate to the left foot. There continues to be improvement in wound healing. Patient has no issues or complaints today. 11/2; the patient's area on the right Achilles heel looks quite healthy and is improved in terms of measurements. We have been using Hydrofera Blue. He is approved for Grafix On the left plantar foot wound over the fifth metatarsal head this tunnels over the lateral part of the met head. He is not offloading this at all 11/16; patient presents for follow-up. He has been using a  surgical shoe with an offloading felt pad to the left lateral foot along with silver alginate. He has been approved for Grafix and this is available for placement today T the right lower extremity wound. Patient Is agreeable to this. We have been using Santyl and o Hydrofera Blue under Kerlix/Coban to this area. 11/21; patient presents for follow-up. We have been doing Grafix to the right lower extremity and silver alginate to the left foot wound. He has no issues or complaints today. 11/30; patient presents for follow-up. We have been doing Grafix to the right lower extremity wound and Medihoney with Hydrofera Blue to the left lateral foot wound. He reports pain to the left foot wound today. He did not obtain his x-ray. 12/11; patient presents for follow-up. He has been using Dakin's wet-to-dry dressings to the left plantar foot wound. We have been doing Grafix to the right lower extremity wound under compression therapy. He obtained his x-ray that showed mild periosteal elevation at the resection site with the possibility of osteomyelitis. He currently denies systemic signs of infection. He has been taking doxycycline and Augmentin without issues. He is getting his INR checked by his primary care office. 12/18; patient presents for follow-up. He has been using Dakin's wet-to-dry dressings to the left foot wound. We have been placing Grafix under compression therapy to the right lower extremity. He is currently taking doxycycline and Augmentin. He denies signs of infection. 12/26; patient presents for follow-up. He has been using Dakin's wet-to-dry dressings to the left foot. He has been off oral antibiotics for potential bone biopsy. We have been placing Grafix under compression therapy to the right lower extremity. There has been improvement in wound healing to both sites. He states he is trying to aggressively offload the left foot. He currently denies signs of infection. EMARION, BABINEAU A  ([id number]) [id number] Page 9 of 13 1/2; patient presents for follow-up. He has been doing Dakin's wet-to-dry dressings to the left foot. He saw Dr. Blenda Mounts today and had a bone biopsy. Debridement was performed on the site. Dressing in place with Dakin's wet-to-dry. T the right leg we have been using Grafix under compression therapy. He o has no issues or complaints today. 1/9; patient  presents for follow-up. We have been using Hydrofera Blue under compression therapy to the right lower extremity wound. This is well-healing. He has been using Dakin's wet-to-dry dressings to the left lateral foot wound. He has been taking ciprofloxacin due to culture results without issues. He was referred to infectious disease by podiatry. He does not have an appointment yet. 1/16; patient presents for follow-up. We were using collagen under Kerlix/Coban to the right lower extremity wound. He has been approved for Grafix and this was available for placement today. He continues to use Dakin's wet-to-dry dressings daily to the left foot. He started levofloxacin. He has an appointment with infectious disease on 1/24. 1/23; patient presents for follow-up. Grafix was placed in standard fashion to the right lower extremity wound last week. He has been using Dakin's wet-to-dry dressings to the left foot. He has been taking levofloxacin. He has an infectious disease appointment tomorrow as well as a pulmonology appointment. He followed up with vein and vascular on 1/16 and plan is for angiogram of the left lower extremity. He has a history of left peroneal angioplasty in 2022. 1/30; patient presents for follow-up. Grafix was placed in standard fashion to the right lower extremity wound last week. There has been improvement in healing here. He has been using Dakin's wet-to-dry dressings to the left foot. Patient saw infectious disease, Dr. Juleen China on 1/24 and plan is for PICC line on 2/5  with cefepime. For now he is taking levofloxacin. He has been cleared by pulmonology for HBO. Due to extensive heart history we have asked HBO clearance from his cardiologist as well. He has follow-up with them tomorrow. He currently has no issues or complaints today. 2/6; patient presents for follow-up. He started HBO therapy today and had no issues with his dive. He had an arteriogram on 2/1 by Dr. Carlis Abbott. He had angioplasty of the left peroneal artery. He is optimized from a vascular standpoint. We have been using Grafix to the right lower extremity wound and Dakin's wet-to-dry dressings to the left foot wound. He started his IV antibiotics yesterday. Patient History Information obtained from Chart. Family History Diabetes - Mother, Heart Disease - Mother,Siblings. Social History Current every day smoker, Alcohol Use - Never, Drug Use - No History, Caffeine Use - Never. Medical History Respiratory Patient has history of Sleep Apnea - does not wear Cardiovascular Patient has history of Arrhythmia - A. Fib, Congestive Heart Failure - EF 25%, Hypertension, Peripheral Arterial Disease Denies history of Peripheral Venous Disease Endocrine Patient has history of Type II Diabetes Hospitalization/Surgery History - Angiogram 06/11/2022 Dr. Carlis Abbott [facility]. - cardioversion 11/06/2021, 2015, 2017. 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 Medical A Surgical History Notes nd Gastrointestinal Ascites Genitourinary stage III Kidney disease Objective Constitutional respirations regular, non-labored and within target range for patient.. Vitals Time Taken: 12:00 PM, Height: 68 in, Weight: 185 lbs, BMI: 28.1. General Notes: see HBO post VS. Cardiovascular 2+ dorsalis pedis/posterior tibialis pulses. Psychiatric pleasant and cooperative. General Notes: Right lower extremity: T the posterior aspect there is an open wound with granulation tissue and nonviable tissue. Venous stasis dermatitis. T o o the left  lower extremity there is An open wound that undermines and probes to bone. No surrounding signs of soft tissue infection. Integumentary (Hair, Skin) Wound #1 status is Open. Original cause of wound was Surgical Injury. The date acquired was: 07/16/2022. The wound has been in treatment 21 weeks. The wound is located on the Right Achilles. The wound measures 0.1cm length x  0.1cm width x 0.1cm depth; 0.008cm^2 area and 0.001cm^3 volume. There is Fat Layer (Subcutaneous Tissue) exposed. There is no tunneling or undermining noted. There is a none present amount of drainage noted. The wound margin is distinct with the outline attached to the wound base. There is no granulation within the wound bed. There is a large (67-100%) amount of necrotic tissue within the wound bed including Eschar. The periwound skin appearance exhibited: Dry/Scaly. The periwound skin appearance did not exhibit: Callus, Crepitus, Excoriation, Induration, Rash, Scarring, Maceration, Atrophie Blanche, Cyanosis, Ecchymosis, Hemosiderin Staining, Mottled, Pallor, Rubor, Erythema. Wound #2 status is Open. Original cause of wound was Gradually Appeared. The date acquired was: 03/09/2022. The wound has been in treatment 21 weeks. The wound is located on the Alpha. The wound measures 0.4cm length x 1cm width x 0.8cm depth; 0.314cm^2 area and 0.251cm^3 volume. There is bone and Fat Layer (Subcutaneous Tissue) exposed. There is no tunneling or undermining noted. There is a medium amount of serosanguineous drainage KHAZA, [facility] A ([id number]) [id number] Page 10 of 13 noted. The wound margin is distinct with the outline attached to the wound base. There is large (67-100%) pink, friable granulation within the wound bed. There is a small (1-33%) amount of necrotic tissue within the wound bed including Adherent Slough. The periwound skin appearance exhibited: Callus, Maceration. The periwound skin appearance  did not exhibit: Crepitus, Excoriation, Induration, Rash, Scarring, Dry/Scaly, Atrophie Blanche, Cyanosis, Ecchymosis, Hemosiderin Staining, Mottled, Pallor, Rubor, Erythema. 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 It has almost healed. I debrided nonviable tissue and replace Grafix today under compression therapy. The left lateral foot wound still probes to bone however there is more healthy granulation tissue present. I recommended continuing Dakin's wet-to- dry dressings here. Continue aggressive offloading. Continue HBO therapy. Continue IV antibiotics per ID infectious disease Procedures Wound #1 Pre-procedure diagnosis of Wound #1 is a Diabetic Wound/Ulcer of the Lower Extremity located on the Right Achilles .Severity of Tissue Pre Debridement is: Fat layer exposed. There was a Excisional Skin/Subcutaneous Tissue Debridement with a total area of 1 sq cm performed by [name], DO. With the following instrument(s): Curette to remove Viable and Non-Viable tissue/material. Material removed includes Eschar, Callus, Subcutaneous Tissue, Skin: Dermis, and Skin: Epidermis after achieving pain control using Lidocaine 4% Topical Solution. A time out was conducted at 13:15, prior to the start of the procedure. A Minimum amount of bleeding was controlled with Pressure. The procedure was tolerated well. Post Debridement Measurements: 1cm length x 0.2cm width x 0.1cm depth; 0.016cm^3 volume. Character of Wound/Ulcer Post Debridement is improved. Severity of Tissue  Post Debridement is: Fat layer exposed. Post procedure Diagnosis Wound #1: Same as Pre-Procedure Pre-procedure diagnosis of Wound #1 is a Diabetic Wound/Ulcer of the Lower Extremity located on the Right Achilles. A skin graft procedure using a bioengineered skin substitute/cellular or tissue based product was performed by [name], DO. Grafix prime was applied and secured with Steri-Strips and adaptic. 2 sq cm of product was utilized and 0 sq cm was wasted. Post Application, no dressing was applied. A Time Out was conducted at 13:20, prior  to the start of the procedure. The procedure was tolerated well with a pain level of 0 throughout and a pain level of 0 following the procedure. Post procedure Diagnosis Wound #1: Same as Pre-Procedure . Plan Follow-up Appointments: Return Appointment in 1 week. - w/ Dr. Heber Banner Tuesday 12/22/22 @ 12:45 Rm # 8 keep appt time once hyperbaric is done he will come to clinic and be added in. Return Appointment in 2 weeks. - Dr. Heber [place] Tuesday 0930 12/29/2022 keep appt time. after hyperbarics will add you into clinic. Return appointment in 3 weeks. - Dr. Heber [place] Tuesday 1015 01/05/2023 keep appt time. after hyperbarics will add you into clinic. Other: - keep Pulmonologist appt at end of February. Continue oral antibiotics till you see Infectious Disease. Ensure to see Vein and Vascular today for follow up tests. Anesthetic: (In clinic) Topical Lidocaine 5% applied to wound bed Cellular or Tissue Based Products: Wound #1 Right Achilles: Cellular or Tissue Based Product Type: - Grafix approved-waiting to hear about copay or coinsurance-cost for you out of your pocket. Kerecsis denied by insurance. Grafix #1 applied on 09/24/22 Grafix #2 applied on 09/29/2022 Grafix # 3 applied on 10/07/22 Grafix # 4 applied on 10/19/22 Grafix # 5 applied 10/26/2022 Grafix applied to right achilles wound 11/03/2022 Grafix pending insurance approval 11/24/2022 Grafix approved  from now to 02/14/2023 total 12. #7 applied. Grafix # 8 applied 12/01/22 Grafix # 9 applied 12/08/22 Grafix #10 applied to 12/15/2022 Cellular or Tissue Based Product applied to wound bed, secured with steri-strips, cover with Adaptic or Mepitel. (DO NOT REMOVE). Edema Control - Lymphedema / SCD / Other: Avoid standing for long periods of time. Off-Loading: Open toe surgical shoe to: - ****put PegAssist in surgical shoe.**** Prevalon Boot - left foot while resting in bed at night. Other: - minimize walking and standing to aid in offloading pressure to wound. Hyperbaric Oxygen Therapy: Wound #2 Left,Plantar Foot: Evaluate for HBO Therapy Indication: - Wagner grade 3 If appropriate for treatment, begin HBOT per protocol: 2.0 ATA for 90 Minutes with 2 Five (5) Minute Air Breaks T Number of Treatments: - 40 otal One treatments per day (delivered Monday through Friday unless otherwise specified in Special Instructions below): Finger stick Blood Glucose Pre- and Post- HBOT Treatment. Follow Hyperbaric Oxygen Glycemia Protocol CORON, GRASER A ([id number]) [id number] Page 11 of 13 Afrin (Oxymetazoline HCL) 0.05% nasal spray - 1 spray in both nostrils daily as needed prior to HBO treatment for difficulty clearing ears The following medication(s) was prescribed: Dakin's Solution miscellaneous 0.25 % solution 1 Moisten gauze for wet to dry dressings starting 12/15/2022 WOUND #1: - Achilles Wound Laterality: Right Cleanser: Soap and Water 1 x Per Week/30 Days Discharge Instructions: May shower and wash wound with dial antibacterial soap and water prior to dressing change. Cleanser: Wound Cleanser 1 x Per Week/30 Days Discharge Instructions: Cleanse the wound with wound cleanser prior to applying a clean dressing using gauze sponges, not tissue or cotton balls. Peri-Wound Care: Sween Lotion (Moisturizing lotion) 1 x Per Week/30 Days Discharge Instructions: Apply moisturizing  lotion as directed Prim Dressing: Grafix #9 1 x Per Week/30 Days ary Discharge Instructions: applied by provider. Prim Dressing: adpatic and steri-strips 1 x Per Week/30 Days ary Discharge Instructions: to secure grafix in place. do not remove. Secondary Dressing: ABD Pad, 5x9 1 x Per Week/30 Days Discharge Instructions: Apply over primary dressing as directed. Secondary Dressing: Woven Gauze Sponge, Non-Sterile 4x4 in 1 x Per Week/30 Days Discharge Instructions: Apply over primary  dressing as directed. Com pression Wrap: Kerlix Roll 4.5x3.1 (in/yd) 1 x Per Week/30 Days Discharge Instructions: Apply Kerlix and Coban compression as directed. Com pression Wrap: Coban Self-Adherent Wrap 4x5 (in/yd) 1 x Per Week/30 Days Discharge Instructions: Apply over Kerlix as directed. WOUND #2: - Foot Wound Laterality: Plantar, Left Cleanser: Wound Cleanser 1 x Per Day/30 Days Discharge Instructions: Cleanse the wound with wound cleanser prior to applying a clean dressing using gauze sponges, not tissue or cotton balls. Peri-Wound Care: Skin Prep (Generic) 1 x Per Day/30 Days Discharge Instructions: Use skin prep as directed Prim Dressing: Dakin's Solution 0.25%, 16 (oz) 1 x Per Day/30 Days ary Discharge Instructions: Moisten gauze with Dakin's solution Secondary Dressing: Optifoam Non-Adhesive Dressing, 4x4 in (Generic) 1 x Per Day/30 Days Discharge Instructions: Apply over primary dressing as directed. Secondary Dressing: Woven Gauze Sponge, Non-Sterile 4x4 in (Generic) 1 x Per Day/30 Days Discharge Instructions: Apply over primary dressing as directed. Secondary Dressing: Woven Gauze Sponges 2x2 in (Generic) 1 x Per Day/30 Days Discharge Instructions: Apply over primary dressing as directed. Secured With: Child psychotherapist, Sterile 2x75 (in/in) (Generic) 1 x Per Day/30 Days Discharge Instructions: Secure with stretch gauze as directed. Secured With: 60M Medipore H Soft Cloth Surgical  T ape, 4 x 10 (in/yd) (Generic) 1 x Per Day/30 Days Discharge Instructions: Secure with tape as directed. 1. In office sharp debridement 2. Grafix placed in standard fashion 3. Dakin's wet-to-dry dressings 4. Follow-up in 1 week Electronic Signature(s) Signed: 12/21/2022 12:24:04 PM By: Deon Pilling RN, BSN Signed: 12/21/2022 1:15:42 PM By: [name] DO Previous Signature: 12/15/2022 1:42:51 PM Version By: [name] DO Entered By: Deon Pilling on 12/21/2022 12:23:17 -------------------------------------------------------------------------------- HxROS Details Patient Name: Date of Service: [name] [name] 12/15/2022 1:30 PM Medical Record Number: [id number] Patient Account Number: [id number] Date of Birth/Sex: Treating RN: 1951/09/16 (72 y.o. M) Primary Care Provider: [name] Other Clinician: Referring Provider: Treating Provider/Extender: [name] Ngetich, [name] in Treatment: 21 Information Obtained From Chart Respiratory Medical History: Positive for: Sleep Apnea - does not wear Cardiovascular Medical History: Positive for: Arrhythmia - A. Fib; Congestive Heart Failure - EF 25%; Hypertension; Peripheral Arterial Disease Negative for: Peripheral Venous Disease CORY, KNOTT A ([id number]) [id number] Page 12 of 13 Gastrointestinal Medical History: Past Medical History Notes: Ascites Endocrine Medical History: Positive for: Type II Diabetes Time with diabetes: 6 years Treated with: Insulin Blood sugar tested every day: No Genitourinary Medical History: Past Medical History Notes: stage III Kidney disease Immunizations Pneumococcal Vaccine: Received Pneumococcal Vaccination: No Implantable Devices No devices added Hospitalization / Surgery History Type of Hospitalization/Surgery Angiogram 06/11/2022 Dr. 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 12/15/2022 Medical Record Number: [id number] Patient Account Number: [id number] Date of Birth/Sex: Treating RN: [birth year] (72 y.o. 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 M86.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, left ankle and foot Facility Procedures GERNARD, SAUCERMAN ([id number]): CPT4 Code Description [id number] [phone]- Grafix PL 16 mm disc (2 units) [phone] Page 13 of 13: Modifier Quantity 2 TERRANCE, SOTOMAYOR A ([id number]): [phone] - SKIN SUB GRAFT TRNK/ARM/LEG ICD-10 Diagnosis Description L97.813 Non-pressure chronic ulcer of other part of right lower leg wit [phone].pdf Page 13 of 13: 1 h necrosis of muscle Physician Procedures : CPT4 Code Description Modifier [id number] - WC PHYS SKIN SUB GRAFT TRNK/ARM/LEG ICD-10 Diagnosis Description  [id number] 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

## 2022-12-16 NOTE — Progress Notes (Signed)
JEARLD, HEMP A ([id number]) [id number] Page 1 of 9 Visit Report for 12/15/2022 Arrival Information Details Patient Name: Date of Service: [name] [facility] A. 12/15/2022 1:30 PM Medical Record Number: [id number] Patient Account Number: [id number] Date of Birth/Sex: Treating RN: Feb 07, 1951 (72 y.o. [name] Primary Care Sarthak Rubenstein: [name] Other Clinician: Referring Orian Figueira: Treating Maelle Sheaffer/Extender: [name] Ngetich, [name] in Treatment: 21 Visit Information History Since Last Visit All ordered tests and consults were completed: Yes Patient Arrived: Cane Added or deleted any medications: No Arrival Time: 13:00 Any new allergies or adverse reactions: No Accompanied By: daughter Had a fall or experienced change in No Transfer Assistance: None activities of daily living that may affect Patient Identification Verified: Yes risk of falls: Secondary Verification Process Completed: Yes Signs or symptoms of abuse/neglect since last visito No Patient Requires Transmission-Based Precautions: No Hospitalized since last visit: No Patient Has Alerts: Yes Implantable device outside of the clinic excluding No Patient Alerts: Patient on Blood Thinner cellular tissue based products placed in the center since last visit: Has Dressing in Place as Prescribed: Yes Has Compression in Place as Prescribed: Yes Pain Present Now: No Notes patient in clinic after hyperbaric. angiogram last week. Electronic Signature(s) Signed: 12/15/2022 6:17:04 PM By: Deon Pilling RN, BSN Entered By: Deon Pilling on 12/15/2022 13:01:26 -------------------------------------------------------------------------------- Encounter Discharge Information Details Patient Name: Date of Service: [name] [name] [facility] A. 12/15/2022 1:30 PM Medical Record Number: [id number] Patient Account Number: [id number] Date of Birth/Sex: Treating RN: February 14, 1951 (72 y.o. [name] Primary Care Shaguana Love: [name] Other Clinician: Referring Preesha Benjamin: Treating Hilliard Borges/Extender: [name] Ngetich, [name] in Treatment: 21 Encounter Discharge Information Items Post Procedure Vitals Discharge Condition: Stable Temperature (F): 98.8 Ambulatory Status: Cane Pulse (bpm): 98 Discharge Destination: Home Respiratory Rate (breaths/min): 20 Transportation: Private Auto Blood Pressure (mmHg): 129/84 Accompanied By: daughter Schedule Follow-up Appointment: Yes Clinical Summary of Care: Electronic Signature(s) Signed: 12/15/2022 6:17:04 PM By: Deon Pilling RN, BSN Entered By: Deon Pilling on 12/15/2022 16:54:02 [name] ([id number]) [id number] Page 2 of 9 -------------------------------------------------------------------------------- Lower Extremity Assessment Details Patient Name: Date of Service: Jenetta Downer DRO [name] [facility] A. 12/15/2022 1:30 PM Medical Record Number: [id number] Patient Account Number: [id number] Date of Birth/Sex: Treating RN: 1951-09-04 (72 y.o. [name] Primary Care Stokes Rattigan: [name] Other Clinician: Referring Hollin Crewe: Treating Nadeem Romanoski/Extender: [name] Ngetich, [name] in Treatment: 21 Edema Assessment Assessed: [Left: Yes] [Right: Yes] Edema: [Left: No] [Right: No] Calf Left: Right: Point of Measurement: 39 cm From Medial Instep 36 cm 36 cm Ankle Left: Right: Point of Measurement: 8 cm From Medial Instep 26 cm 26 cm Vascular Assessment Pulses: Dorsalis Pedis Palpable: [Left:Yes] [Right:Yes] Electronic Signature(s) Signed: 12/15/2022 6:17:04 PM By: Deon Pilling RN, BSN Entered By: Deon Pilling on 12/15/2022 13:01:14 -------------------------------------------------------------------------------- Multi Wound Chart Details Patient Name: Date of Service: [name] 12/15/2022 1:30 PM Medical Record Number: [id number] Patient Account Number:  [id number] Date of Birth/Sex: Treating RN: 1951/06/11 (72 y.o. M) Primary Care Vinal Rosengrant: [name] Other Clinician: Referring Briyana Badman: Treating Bennette Hasty/Extender: [name] Ngetich, [name] in Treatment: 21 [1:Photos:] [N/A:N/A] Right Achilles Left, Plantar Foot N/A Wound Location: Surgical Injury Gradually Appeared N/A Wounding Event: Diabetic Wound/Ulcer of the Lower Diabetic Wound/Ulcer of the Lower N/A Primary Etiology: Extremity Extremity Sleep Apnea, Arrhythmia, Congestive Sleep Apnea, Arrhythmia, Congestive N/A Comorbid History: Heart Failure, Hypertension, Peripheral Heart Failure, Hypertension, Peripheral Arterial Disease, Type II Diabetes Arterial Disease, Type II Diabetes Granderson, Zaydenn  A ([id number]) [id number] Page 3 of 9 07/16/2022 03/09/2022 N/A Date Acquired: 21 21 N/A Weeks of Treatment: Open Open N/A Wound Status: No No N/A Wound Recurrence: 0.1x0.1x0.1 0.4x1x0.8 N/A Measurements L x W x D (cm) 0.008 0.314 N/A A (cm) : rea 0.001 0.251 N/A Volume (cm) : 100.00% -42.70% N/A % Reduction in A rea: 100.00% -280.30% N/A % Reduction in Volume: Grade 2 Grade 3 N/A Classification: None Present Medium N/A Exudate A mount: N/A Serosanguineous N/A Exudate Type: N/A red, brown N/A Exudate Color: Distinct, outline attached Distinct, outline attached N/A Wound Margin: None Present (0%) Large (67-100%) N/A Granulation A mount: N/A Pink, Friable N/A Granulation Quality: Large (67-100%) Small (1-33%) N/A Necrotic A mount: Eschar Adherent Slough N/A Necrotic Tissue: Fat Layer (Subcutaneous Tissue): Yes Fat Layer (Subcutaneous Tissue): Yes N/A Exposed Structures: Fascia: No Bone: Yes Tendon: No Fascia: No Muscle: No Tendon: No Joint: No Muscle: No Bone: No Joint: No Large (67-100%) Small (1-33%) N/A Epithelialization: Debridement - Excisional N/A N/A Debridement: Pre-procedure Verification/Time Out 13:15  N/A N/A Taken: Lidocaine 4% Topical Solution N/A N/A Pain Control: Necrotic/Eschar, Callus, SubcutaneousN/A N/A Tissue Debrided: Skin/Subcutaneous Tissue N/A N/A Level: 1 N/A N/A Debridement A (sq cm): rea Curette N/A N/A Instrument: Minimum N/A N/A Bleeding: Pressure N/A N/A Hemostasis A chieved: Procedure was tolerated well N/A N/A Debridement Treatment Response: 1x0.2x0.1 N/A N/A Post Debridement Measurements L x W x D (cm) 0.016 N/A N/A Post Debridement Volume: (cm) Excoriation: No Callus: Yes N/A Periwound Skin Texture: Induration: No Excoriation: No Callus: No Induration: No Crepitus: No Crepitus: No Rash: No Rash: No Scarring: No Scarring: No Dry/Scaly: Yes Maceration: Yes N/A Periwound Skin Moisture: Maceration: No Dry/Scaly: No Atrophie Blanche: No Atrophie Blanche: No N/A Periwound Skin Color: Cyanosis: No Cyanosis: No Ecchymosis: No Ecchymosis: No Erythema: No Erythema: No Hemosiderin Staining: No Hemosiderin Staining: No Mottled: No Mottled: No Pallor: No Pallor: No Rubor: No Rubor: No Cellular or Tissue Based Product N/A N/A Procedures Performed: Debridement Treatment Notes Electronic Signature(s) Signed: 12/15/2022 1:42:51 PM By: [name] DO Entered By: [name] on 12/15/2022 13:35:32 -------------------------------------------------------------------------------- Multi-Disciplinary Care Plan Details Patient Name: Date of Service: [name] [name] [name] 12/15/2022 1:30 PM Medical Record Number: [id number] Patient Account Number: [id number] Date of Birth/Sex: Treating RN: January 16, 1951 (72 y.o. [name] Primary Care Verlean Allport: [name] Other Clinician: Referring Nycere Presley: Treating Sonyia Muro/Extender: [name], [name] in Treatment: [address] [place] A ([id number]) [id number] Page 4 of 9 Active Inactive Nutrition Nursing Diagnoses: Potential for  alteratiion in Nutrition/Potential for imbalanced nutrition Goals: Patient/caregiver agrees to and verbalizes understanding of need to use nutritional supplements and/or vitamins as prescribed Date Initiated: 07/16/2022 Target Resolution Date: 01/08/2023 Goal Status: Active Patient/caregiver will maintain therapeutic glucose control Date Initiated: 07/16/2022 Target Resolution Date: 01/08/2023 Goal Status: Active Interventions: Assess HgA1c results as ordered upon admission and as needed Provide education on nutrition Treatment Activities: Education provided on Nutrition : 10/08/2022 Obtain HgA1c : 07/16/2022 Patient referred to Primary Care Physician for further nutritional evaluation : 07/16/2022 Notes: Pain, Acute or Chronic Nursing Diagnoses: Pain, acute or chronic: actual or potential Potential alteration in comfort, pain Goals: Patient will verbalize adequate pain control and receive pain control interventions during procedures as needed Date Initiated: 07/16/2022 Target Resolution Date: 01/08/2023 Goal Status: Active Interventions: Encourage patient to take pain medications as prescribed Provide education on pain management Reposition patient for comfort Treatment Activities: Administer pain control measures as ordered : 07/16/2022 Notes: Electronic Signature(s) Signed: 12/15/2022 6:17:04  PM By: Shawn Stall RN, BSN Entered By: Shawn Stall on 12/15/2022 13:19:11 -------------------------------------------------------------------------------- Pain Assessment Details Patient Name: Date of Service: [name] [facility] A. 12/15/2022 1:30 PM Medical Record Number: [id number] Patient Account Number: [id number] Date of Birth/Sex: Treating RN: Aug 06, 1951 (72 y.o. [name] Primary Care Blas Riches: Richarda Blade Other Clinician: Referring Nyonna Hargrove: Treating Adisynn Suleiman/Extender: Geralyn Corwin Ngetich, [name] in Treatment: 21 Active Problems Location of Pain Severity and  Description of Pain Patient Has Paino No Site Locations Rate the pain. VASILIOS, OTTAWAY A ([id number]) [id number] Page 5 of 9 Rate the pain. Current Pain Level: 0 Pain Management and Medication Current Pain Management: Medication: No Cold Application: No Rest: No Massage: No Activity: No T.E.N.S.: No Heat Application: No Leg drop or elevation: No Is the Current Pain Management Adequate: Adequate How does your wound impact your activities of daily livingo Sleep: No Bathing: No Appetite: No Relationship With Others: No Bladder Continence: No Emotions: No Bowel Continence: No Work: No Toileting: No Drive: No Dressing: No Hobbies: No Electronic Signature(s) Signed: 12/15/2022 6:17:04 PM By: Shawn Stall RN, BSN Entered By: Shawn Stall on 12/15/2022 13:01:01 -------------------------------------------------------------------------------- Patient/Caregiver Education Details Patient Name: Date of Service: [name] Alden Benjamin 2/6/2024andnbsp1:30 PM Medical Record Number: [id number] Patient Account Number: [id number] Date of Birth/Gender: Treating RN: 1951-01-13 (72 y.o. [name] Primary Care Physician: Richarda Blade Other Clinician: Referring Physician: Treating Physician/Extender: Jae Dire in Treatment: 21 Education Assessment Education Provided To: Patient Education Topics Provided Wound/Skin Impairment: Handouts: Caring for Your Ulcer Methods: Explain/Verbal Responses: Reinforcements needed Electronic Signature(s) Signed: 12/15/2022 6:17:04 PM By: Shawn Stall RN, BSN Entered By: Shawn Stall on 12/15/2022 13:19:29 [name] ([id number]) [id number] Page 6 of 9 -------------------------------------------------------------------------------- Wound Assessment Details Patient Name: Date of Service: [name] [facility] A. 12/15/2022 1:30 PM Medical Record Number:  [id number] Patient Account Number: [id number] Date of Birth/Sex: Treating RN: Mar 30, 1951 (72 y.o. [name] Primary Care Steffany Schoenfelder: Richarda Blade Other Clinician: Referring Kyoko Elsea: Treating Frimy Uffelman/Extender: Geralyn Corwin Ngetich, [name] in Treatment: 21 Wound Status Wound Number: 1 Primary Diabetic Wound/Ulcer of the Lower Extremity Etiology: Wound Location: Right Achilles Wound Open Wounding Event: Surgical Injury Status: Date Acquired: 07/16/2022 Comorbid Sleep Apnea, Arrhythmia, Congestive Heart Failure, Hypertension, Weeks Of Treatment: 21 History: Peripheral Arterial Disease, Type II Diabetes Clustered Wound: No Photos Wound Measurements Length: (cm) Width: (cm) Depth: (cm) Area: (cm) Volume: (cm) 0.1 % Reduction in Area: 100% 0.1 % Reduction in Volume: 100% 0.1 Epithelialization: Large (67-100%) 0.008 Tunneling: No 0.001 Undermining: No Wound Description Classification: Grade 2 Wound Margin: Distinct, outline attached Exudate Amount: None Present Foul Odor After Cleansing: No Slough/Fibrino No Wound Bed Granulation Amount: None Present (0%) Exposed Structure Necrotic Amount: Large (67-100%) Fascia Exposed: No Necrotic Quality: Eschar Fat Layer (Subcutaneous Tissue) Exposed: Yes Tendon Exposed: No Muscle Exposed: No Joint Exposed: No Bone Exposed: No Periwound Skin Texture Texture Color No Abnormalities Noted: No No Abnormalities Noted: No Callus: No Atrophie Blanche: No Crepitus: No Cyanosis: No Excoriation: No Ecchymosis: No Induration: No Erythema: No Rash: No Hemosiderin Staining: No Scarring: No Mottled: No Pallor: No Moisture Rubor: No No Abnormalities Noted: No Dry / Scaly: Yes Maceration: No ZAKHARI, FOGEL A ([id number]) [phone].pdf Page 7 of 9 Treatment Notes Wound #1 (Achilles) Wound Laterality: Right Cleanser Soap and Water Discharge Instruction: May shower and wash wound with dial  antibacterial soap and water prior to dressing change. Wound Cleanser Discharge Instruction: Cleanse the wound with wound  cleanser prior to applying a clean dressing using gauze sponges, not tissue or cotton balls. Peri-Wound Care Sween Lotion (Moisturizing lotion) Discharge Instruction: Apply moisturizing lotion as directed Topical Primary Dressing Grafix #9 Discharge Instruction: applied by Jakiyah Stepney. adpatic and steri-strips Discharge Instruction: to secure grafix in place. do not remove. Secondary Dressing ABD Pad, 5x9 Discharge Instruction: Apply over primary dressing as directed. Woven Gauze Sponge, Non-Sterile 4x4 in Discharge Instruction: Apply over primary dressing as directed. Secured With Compression Wrap Kerlix Roll 4.5x3.1 (in/yd) Discharge Instruction: Apply Kerlix and Coban compression as directed. Coban Self-Adherent Wrap 4x5 (in/yd) Discharge Instruction: Apply over Kerlix as directed. Compression Stockings Add-Ons Electronic Signature(s) Signed: 12/15/2022 6:17:04 PM By: Deon Pilling RN, BSN Entered By: Deon Pilling on 12/15/2022 13:04:14 -------------------------------------------------------------------------------- Wound Assessment Details Patient Name: Date of Service: [name] [name] [facility] A. 12/15/2022 1:30 PM Medical Record Number: [id number] Patient Account Number: [id number] Date of Birth/Sex: Treating RN: 1951-01-21 (72 y.o. [name] Primary Care Itay Mella: [name] Other Clinician: Referring Quinlee Sciarra: Treating Mackenzey Crownover/Extender: [name] Ngetich, [name] in Treatment: 21 Wound Status Wound Number: 2 Primary Diabetic Wound/Ulcer of the Lower Extremity Etiology: Wound Location: Left, Plantar Foot Wound Open Wounding Event: Gradually Appeared Status: Date Acquired: 03/09/2022 Comorbid Sleep Apnea, Arrhythmia, Congestive Heart Failure, Hypertension, Weeks Of Treatment: 21 History: Peripheral Arterial Disease, Type II  Diabetes Clustered Wound: No Photos JLEN, WINTLE A ([id number]) [id number] Page 8 of 9 Wound Measurements Length: (cm) 0.4 Width: (cm) 1 Depth: (cm) 0.8 Area: (cm) 0.314 Volume: (cm) 0.251 % Reduction in Area: -42.7% % Reduction in Volume: -280.3% Epithelialization: Small (1-33%) Tunneling: No Undermining: No Wound Description Classification: Grade 3 Wound Margin: Distinct, outline attached Exudate Amount: Medium Exudate Type: Serosanguineous Exudate Color: red, brown Foul Odor After Cleansing: No Slough/Fibrino Yes Wound Bed Granulation Amount: Large (67-100%) Exposed Structure Granulation Quality: Pink, Friable Fascia Exposed: No Necrotic Amount: Small (1-33%) Fat Layer (Subcutaneous Tissue) Exposed: Yes Necrotic Quality: Adherent Slough Tendon Exposed: No Muscle Exposed: No Joint Exposed: No Bone Exposed: Yes Periwound Skin Texture Texture Color No Abnormalities Noted: No No Abnormalities Noted: No Callus: Yes Atrophie Blanche: No Crepitus: No Cyanosis: No Excoriation: No Ecchymosis: No Induration: No Erythema: No Rash: No Hemosiderin Staining: No Scarring: No Mottled: No Pallor: No Moisture Rubor: No No Abnormalities Noted: No Dry / Scaly: No Maceration: Yes Treatment Notes Wound #2 (Foot) Wound Laterality: Plantar, Left Cleanser Wound Cleanser Discharge Instruction: Cleanse the wound with wound cleanser prior to applying a clean dressing using gauze sponges, not tissue or cotton balls. Peri-Wound Care Skin Prep Discharge Instruction: Use skin prep as directed Topical Primary Dressing Dakin's Solution 0.25%, 16 (oz) Discharge Instruction: Moisten gauze with Dakin's solution Secondary Dressing Optifoam Non-Adhesive Dressing, 4x4 in Discharge Instruction: Apply over primary dressing as directed. Woven Gauze Sponge, Non-Sterile 4x4 in Discharge Instruction: Apply over primary dressing as directed. ASAPH, SERENA A ([id number]) [id number] Page 9 of 9 Woven Gauze Sponges 2x2 in Discharge Instruction: Apply over primary dressing as directed. Secured With Conforming Stretch Gauze Bandage, Sterile 2x75 (in/in) Discharge Instruction: Secure with stretch gauze as directed. 20M Medipore H Soft Cloth Surgical T ape, 4 x 10 (in/yd) Discharge Instruction: Secure with tape as directed. Compression Wrap Compression Stockings Add-Ons Electronic Signature(s) Signed: 12/15/2022 6:17:04 PM By: Deon Pilling RN, BSN Entered By: Deon Pilling on 12/15/2022 13:18:20 -------------------------------------------------------------------------------- Vitals Details Patient Name: Date of Service: [name] 12/15/2022 1:30 PM Medical Record Number: [id number] Patient Account Number: [id number] Date of Birth/Sex:  Treating RN: December 25, 1950 (72 y.o. [name] Primary Care Toribio Seiber: Richarda Blade Other Clinician: Referring Montrice Montuori: Treating Jessilynn Taft/Extender: Geralyn Corwin Ngetich, [name] in Treatment: 21 Vital Signs Time Taken: 12:00 Reference Range: 80 - 120 mg / dl Height (in): 68 Weight (lbs): 185 Body Mass Index (BMI): 28.1 Notes see HBO post VS. Electronic Signature(s) Signed: 12/15/2022 6:17:04 PM By: Shawn Stall RN, BSN Entered By: Shawn Stall on 12/15/2022 16:48:48

## 2022-12-16 NOTE — Progress Notes (Addendum)
JORAN, ULIN A ([id number]) [id number] Page 1 of 2 Visit Report for 12/16/2022 HBO Details Patient Name: Date of Service: [name] [facility] A. 12/16/2022 8:00 A M Medical Record Number: [id number] Patient Account Number: [id number] Date of Birth/Sex: Treating RN: 06/15/1951 (72 y.o. Valma Cava Primary Care Sami Froh: Richarda Blade Other Clinician: Haywood Pao Referring Teegan Brandis: Treating Aloha Bartok/Extender: Georgiann Hahn in Treatment: 21 HBO Treatment Course Details Treatment Course Number: 1 Ordering Jule Whitsel: Geralyn Corwin T Treatments Ordered: otal 40 HBO Treatment Start Date: 12/15/2022 HBO Indication: Diabetic Ulcer(s) of the Lower Extremity Standard/Conservative Wound Care tried and failed greater than or equal to 30 days Wound #2 Left, Plantar Foot HBO Treatment Details Treatment Number: 2 Patient Type: Outpatient Chamber Type: Monoplace Chamber Serial #: [id number] Treatment Protocol: 2.0 ATA with 90 minutes oxygen, with two 5 minute air breaks Treatment Details Compression Rate Down: 1.0 psi / minute De-Compression Rate Up: 1.0 psi / minute A breaks and breathing ir Compress Tx Pressure periods Decompress Decompress Begins Reached (leave unused spaces Begins Ends blank) Chamber Pressure (ATA 1 2 2 2 2 2  --2 1 ) Clock Time (24 hr) 08:35 08:49 09:20 09:25 09:55 10:00 - - 10:29 10:44 Treatment Length: 129 (minutes) Treatment Segments: 4 Vital Signs Capillary Blood Glucose Reference Range: 80 - 120 mg / dl HBO Diabetic Blood Glucose Intervention Range: <131 mg/dl or >956 mg/dl Type: Time Vitals Blood Pulse: Respiratory Temperature: Capillary Blood Glucose Pulse Action Taken: Pressure: Rate: Glucose (mg/dl): Meter #: Oximetry (%) Taken: Pre 07:54 154/80 98 18 98.3 125 1 Patient given 8 oz Glucerna Shake Post 10:55 136/76 59 18 98.3 140 1 asymptomatic for bradycardia Pre 08:27 149 Cleared for HBO  treatment. Treatment Response Treatment Toleration: Well Treatment Completion Status: Treatment Completed without Adverse Event Treatment Notes Patient arrived for treatment, blood glucose level taken, 125 mg/dL. Patient given 8 oz Glucerna, 4 oz apple juice, and 4 oz cranberry juice. Patient dressed for treatment. Blood glucose prior to treatment was 149 mg/dL (3875). After performing safety check, patient placed in the chamber and chamber was compressed at a rate of 1 psi/min until treatment depth of 2 ATA. Mr. Takemura tolerated treatment and decompression of chamber at 1 psi/min. Post treatment glucose was 140 mg/dL. Patient denied any issues with ear equalization and/or pain. Patient was stable upon discharge. Additional Procedure Documentation Tissue Sevierity: Necrosis of bone Electronic Signature(s) Signed: 12/17/2022 3:40:10 PM By: Haywood Pao CHT EMT BS , , Signed: 03/26/2023 12:11:32 PM By: Baltazar Najjar MD Previous Signature: 12/16/2022 2:53:29 PM Version By: Haywood Pao CHT EMT BS , , Previous Signature: 12/16/2022 3:38:34 PM Version By: Baltazar Najjar MD Previous Signature: 12/16/2022 12:54:51 PM Version By: Haywood Pao CHT EMT BS , , Entered By: Haywood Pao on 12/17/2022 15:40:10 HBO Safety Checklist Details -------------------------------------------------------------------------------- [name] ([id number]) [id number] Page 2 of 2 Patient Name: Date of Service: [name] [facility] A. 12/16/2022 8:00 A M Medical Record Number: [id number] Patient Account Number: [id number] Date of Birth/Sex: Treating RN: 1950-11-14 (72 y.o. Valma Cava Primary Care Zohar Laing: Richarda Blade Other Clinician: Haywood Pao Referring Nichoals Heyde: Treating Serrina Minogue/Extender: Georgiann Hahn in Treatment: 21 HBO Safety Checklist Items Safety Checklist Consent Form Signed Patient voided / foley secured and  emptied When did you last eato 0645 Last dose of injectable or oral agent last night Ostomy pouch emptied and vented if applicable NA All implantable devices assessed, documented and approved NA Intravenous access site secured and  place R arm PICC Valuables secured Linens and cotton and cotton/polyester blend (less than 51% polyester) Personal oil-based products / skin lotions / body lotions removed Wigs or hairpieces removed NA Smoking or tobacco materials removed NA Books / newspapers / magazines / loose paper removed Cologne, aftershave, perfume and deodorant removed Jewelry removed (may wrap wedding band) Make-up removed NA Hair care products removed Battery operated devices (external) removed Heating patches and chemical warmers removed Titanium eyewear removed Nail polish cured greater than 10 hours NA Casting material cured greater than 10 hours NA Hearing aids removed NA Loose dentures or partials removed NA Prosthetics have been removed NA Patient demonstrates correct use of air break device (if applicable) Patient concerns have been addressed Patient grounding bracelet on and cord attached to chamber Specifics for Inpatients (complete in addition to above) Medication sheet sent with patient NA Intravenous medications needed or due during therapy sent with patient NA Drainage tubes (e.g. nasogastric tube or chest tube secured and vented) NA Endotracheal or Tracheotomy tube secured NA Cuff deflated of air and inflated with saline NA Airway suctioned NA Notes Paper version used prior to treatment. Electronic Signature(s) Signed: 12/16/2022 2:58:21 PM By: Haywood Pao CHT EMT BS , , Previous Signature: 12/16/2022 12:38:51 PM Version By: Haywood Pao CHT EMT BS , , Entered By: Haywood Pao on 12/16/2022 14:58:21

## 2022-12-17 ENCOUNTER — Other Ambulatory Visit: Payer: Self-pay | Admitting: Family

## 2022-12-17 ENCOUNTER — Encounter (HOSPITAL_BASED_OUTPATIENT_CLINIC_OR_DEPARTMENT_OTHER): Payer: Medicare HMO | Admitting: Internal Medicine

## 2022-12-17 DIAGNOSIS — E11621 Type 2 diabetes mellitus with foot ulcer: Secondary | ICD-10-CM

## 2022-12-17 DIAGNOSIS — I5042 Chronic combined systolic (congestive) and diastolic (congestive) heart failure: Secondary | ICD-10-CM

## 2022-12-17 DIAGNOSIS — E1151 Type 2 diabetes mellitus with diabetic peripheral angiopathy without gangrene: Secondary | ICD-10-CM | POA: Diagnosis not present

## 2022-12-17 DIAGNOSIS — L97518 Non-pressure chronic ulcer of other part of right foot with other specified severity: Secondary | ICD-10-CM | POA: Diagnosis not present

## 2022-12-17 DIAGNOSIS — L97813 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: Secondary | ICD-10-CM | POA: Diagnosis not present

## 2022-12-17 DIAGNOSIS — Z794 Long term (current) use of insulin: Secondary | ICD-10-CM | POA: Diagnosis not present

## 2022-12-17 DIAGNOSIS — L97522 Non-pressure chronic ulcer of other part of left foot with fat layer exposed: Secondary | ICD-10-CM | POA: Diagnosis not present

## 2022-12-17 DIAGNOSIS — M86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, left ankle and foot: Secondary | ICD-10-CM | POA: Diagnosis not present

## 2022-12-17 DIAGNOSIS — I110 Hypertensive heart disease with heart failure: Secondary | ICD-10-CM | POA: Diagnosis not present

## 2022-12-17 DIAGNOSIS — E1169 Type 2 diabetes mellitus with other specified complication: Secondary | ICD-10-CM | POA: Diagnosis not present

## 2022-12-17 LAB — GLUCOSE, CAPILLARY
Glucose-Capillary: 116 mg/dL — ABNORMAL HIGH (ref 70–99)
Glucose-Capillary: 129 mg/dL — ABNORMAL HIGH (ref 70–99)

## 2022-12-17 NOTE — Progress Notes (Addendum)
[name], [name] ([id number]) [id number] Page 1 of 2 Visit Report for 12/17/2022 HBO Details Patient Name: Date of Service: [name] [facility] [name]. 12/17/2022 8:00 [name]: [id number] Patient Account Number: [id number] Date of Birth/Sex: Treating RN: [birth year] (72 y.o. [name] Primary Care Quetzal Meany: [name] Other Clinician: [name] Referring Damacio Weisgerber: Treating Andreah Goheen/Extender: [name] Ngetich, [name] in Treatment: 22 HBO Treatment Course Details Treatment Course Number: 1 Ordering Tydarius Yawn: [name] T Treatments Ordered: otal 40 HBO Treatment Start Date: 12/15/2022 HBO Indication: Diabetic Ulcer(s) of the Lower Extremity Standard/Conservative Wound Care tried and failed greater than or equal to 30 days Wound #2 Left, Plantar Foot HBO Treatment Details Treatment Number: 3 Patient Type: Outpatient Chamber Type: Monoplace Chamber Serial #: [id number] Treatment Protocol: 2.0 ATA with 90 minutes oxygen, with two 5 minute air breaks Treatment Details Compression Rate Down: 1.5 psi / minute De-Compression Rate Up: 1.5 psi / minute [name] breaks and breathing ir Compress Tx Pressure periods Decompress Decompress Begins Reached (leave unused spaces Begins Ends blank) Chamber Pressure (ATA 1 2 2 2 2 2  --2 1 ) Clock Time (24 hr) 08:18 08:31 09:02 09:07 09:37 09:42 - - 10:12 10:33 Treatment Length: 135 (minutes) Treatment Segments: 4 Vital Signs Capillary Blood Glucose Reference Range: 80 - 120 mg / dl HBO Diabetic Blood Glucose Intervention Range: <131 mg/dl or >249 mg/dl Time Capillary Blood Glucose Pulse Blood Respiratory Action Type: Vitals Pulse: Temperature: Glucose Oximetry Pressure: Rate: Meter #: Taken: Taken: (mg/dl): (%) given 8 oz glucerna, 2 CBG not taken per Pre 07:41 147/73 70 18 97.6 116 1 Manaia Samad Post 10:37 123/74 57 18 98.4 129 1 patient denies symptoms of  bradycardia Treatment Response Treatment Toleration: Well Treatment Completion Status: Treatment Completed without Adverse Event Treatment Notes Patient arrived for treatment, blood glucose level taken, 116 mg/dL. Patient states he ate some chili and bread and [name] sip of watered down pepsi and [name] sip of fresh pepsi. Patient given 8 oz Glucerna, 2x 4 oz orange juice per Dr. Heber Kinder order. Secondary glucose was not taken by Dr. Jodene Nam standing order today. Patient placed in the chamber after performing safety check. Chamber compressed at 1 psi/min until reaching approximately 7 psig and rate set was increased to 1.5 psi/min after multiple times patient verifying that ear equalization was normal. Patient tolerated treatment and subsequent decompression of the chamber at 1.5 psi/min. Patient denied any issues with ear equalization and/or pain. Post treatment glucose level was 129 mg/dL. Patient was stable upon discharge. Additional Procedure Documentation Tissue Sevierity: Necrosis of bone Physician HBO Attestation: I certify that I supervised this HBO treatment in accordance with Medicare guidelines. [name] trained emergency response team is readily available per Yes hospital policies and procedures. Continue HBOT as ordered. Yes [name], [name] ([id number]) [id number] Page 2 of 2 Electronic Signature(s) Signed: 12/17/2022 3:04:17 PM By: [name] DO Previous Signature: 12/17/2022 1:41:47 PM Version By: [name] CHT EMT BS , , Previous Signature: 12/17/2022 1:40:00 PM Version By: [name] CHT EMT BS , , Previous Signature: 12/17/2022 9:19:23 AM Version By: [name] CHT EMT BS , , Entered By: [name] on 12/17/2022 15:03:58 -------------------------------------------------------------------------------- HBO Safety Checklist Details Patient Name: Date of Service: [name] [facility] [name]. 12/17/2022 8:00 [name]:  [id number] Patient Account Number: [id number] Date of Birth/Sex: Treating RN: Sep 16, 1951 (72 y.o. [name] Primary Care Marytza Grandpre: [name] Other Clinician: [name] Referring Isaac Lacson: Treating Ailine Hefferan/Extender: Heber [place],  Jessica Ngetich, [name] in Treatment: 22 HBO Safety Checklist Items Safety Checklist Consent Form Signed Patient voided / foley secured and emptied When did you last eato 0700 Last dose of injectable or oral agent Last Evening Ostomy pouch emptied and vented if applicable NA All implantable devices assessed, documented and approved NA Intravenous access site secured and place NA Valuables secured Linens and cotton and cotton/polyester blend (less than 51% polyester) Personal oil-based products / skin lotions / body lotions removed Wigs or hairpieces removed NA Smoking or tobacco materials removed NA Books / newspapers / magazines / loose paper removed Cologne, aftershave, perfume and deodorant removed Jewelry removed (may wrap wedding band) Make-up removed NA Hair care products removed Battery operated devices (external) removed Heating patches and chemical warmers removed Titanium eyewear removed Nail polish cured greater than 10 hours NA Casting material cured greater than 10 hours NA Hearing aids removed NA Loose dentures or partials removed NA Prosthetics have been removed NA Patient demonstrates correct use of air break device (if applicable) Patient concerns have been addressed Patient grounding bracelet on and cord attached to chamber Specifics for Inpatients (complete in addition to above) Medication sheet sent with patient NA Intravenous medications needed or due during therapy sent with patient NA Drainage tubes (e.g. nasogastric tube or chest tube secured and vented) NA Endotracheal or Tracheotomy tube secured NA Cuff deflated of air and inflated with saline NA Airway suctioned NA Notes Paper  version used prior to treatment Electronic Signature(s) Signed: 12/17/2022 9:14:55 AM By: [name] CHT EMT BS , , Entered By: [name] on 12/17/2022 09:14:55

## 2022-12-17 NOTE — Progress Notes (Addendum)
HARLESS, MOLINARI A ([id number]) [id number] Page 1 of 2 Visit Report for 12/17/2022 Arrival Information Details Patient Name: Date of Service: [name] [facility] A. 12/17/2022 8:00 A M Medical Record Number: [id number] Patient Account Number: [id number] Date of Birth/Sex: Treating RN: [birth year] (72 y.o. [name] Primary Care Atreyu Mak: [name] Other Clinician: [name] Referring Blaze Nylund: Treating Nalini Alcaraz/Extender: [name] Ngetich, [name] in Treatment: 22 Visit Information History Since Last Visit All ordered tests and consults were completed: Yes Patient Arrived: Cane Added or deleted any medications: No Arrival Time: 07:30 Any new allergies or adverse reactions: No Accompanied By: daughter Had a fall or experienced change in No Transfer Assistance: None activities of daily living that may affect Patient Identification Verified: Yes risk of falls: Secondary Verification Process Completed: Yes Signs or symptoms of abuse/neglect since last visito No Patient Requires Transmission-Based Precautions: No Hospitalized since last visit: No Patient Has Alerts: Yes Implantable device outside of the clinic excluding No Patient Alerts: Patient on Blood Thinner cellular tissue based products placed in the center since last visit: Pain Present Now: No Electronic Signature(s) Signed: 12/17/2022 9:12:41 AM By: [name] CHT EMT BS , , Entered By: [name] on 12/17/2022 09:12:41 -------------------------------------------------------------------------------- Encounter Discharge Information Details Patient Name: Date of Service: [name] [place] [place] A. 12/17/2022 8:00 A M Medical Record Number: [id number] Patient Account Number: [id number] Date of Birth/Sex: Treating RN: 10-23-1951 (72 y.o. [name] Primary Care Yulanda Diggs: [name] Other Clinician: [name] Referring Deangelo Berns: Treating  Pepper Kerrick/Extender: [name] Ngetich, [name] in Treatment: 22 Encounter Discharge Information Items Discharge Condition: Stable Ambulatory Status: Cane Discharge Destination: Home Transportation: Private Auto Accompanied By: Daughter Schedule Follow-up Appointment: No Clinical Summary of Care: Electronic Signature(s) Signed: 12/17/2022 1:44:14 PM By: [name] CHT EMT BS , , Entered By: [name] on 12/17/2022 13:44:14 [name] ([id number]) [id number] Page 2 of 2 -------------------------------------------------------------------------------- Vitals Details Patient Name: Date of Service: [name] [facility] A. 12/17/2022 8:00 A M Medical Record Number: [id number] Patient Account Number: [id number] Date of Birth/Sex: Treating RN: 06-29-1951 (72 y.o. [name] Primary Care Fleda Pagel: [name] Other Clinician: [name] Referring Arriel Victor: Treating Luci Bellucci/Extender: [name] Ngetich, [name] in Treatment: 22 Vital Signs Time Taken: 07:41 Temperature (F): 97.6 Height (in): 68 Pulse (bpm): 70 Weight (lbs): 185 Respiratory Rate (breaths/min): 18 Body Mass Index (BMI): 28.1 Blood Pressure (mmHg): 147/73 Capillary Blood Glucose (mg/dl): 116 Reference Range: 80 - 120 mg / dl Electronic Signature(s) Signed: 12/17/2022 9:13:47 AM By: [name] CHT EMT BS , , Entered By: [name] on 12/17/2022 09:13:47

## 2022-12-17 NOTE — Progress Notes (Signed)
[name], [name] ([id number]) [id number] Page 1 of 1 Visit Report for 12/17/2022 SuperBill Details Patient Name: Date of Service: [name] [facility] [name]. 12/17/2022 Medical Record Number: [id number] Patient Account Number: [id number] Date of Birth/Sex: Treating RN: 1951/10/19 (72 y.o. 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, left ankle and foot Facility Procedures CPT4 Code Description Modifier Quantity [id number] G0277-(Facility Use Only) HBOT full body chamber, 85min , 4 ICD-10 Diagnosis Description E11.621 Type 2 diabetes mellitus with foot ulcer 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

## 2022-12-18 ENCOUNTER — Encounter (HOSPITAL_BASED_OUTPATIENT_CLINIC_OR_DEPARTMENT_OTHER): Payer: Medicare HMO | Admitting: Internal Medicine

## 2022-12-21 ENCOUNTER — Encounter (HOSPITAL_BASED_OUTPATIENT_CLINIC_OR_DEPARTMENT_OTHER): Payer: Medicare HMO | Admitting: Internal Medicine

## 2022-12-21 ENCOUNTER — Ambulatory Visit: Payer: Medicare HMO | Admitting: Family

## 2022-12-21 ENCOUNTER — Ambulatory Visit: Payer: Medicare HMO | Attending: Cardiovascular Disease

## 2022-12-21 DIAGNOSIS — E11621 Type 2 diabetes mellitus with foot ulcer: Secondary | ICD-10-CM | POA: Diagnosis not present

## 2022-12-21 DIAGNOSIS — E1169 Type 2 diabetes mellitus with other specified complication: Secondary | ICD-10-CM | POA: Diagnosis not present

## 2022-12-21 DIAGNOSIS — E1151 Type 2 diabetes mellitus with diabetic peripheral angiopathy without gangrene: Secondary | ICD-10-CM | POA: Diagnosis not present

## 2022-12-21 DIAGNOSIS — Z5181 Encounter for therapeutic drug level monitoring: Secondary | ICD-10-CM

## 2022-12-21 DIAGNOSIS — L97522 Non-pressure chronic ulcer of other part of left foot with fat layer exposed: Secondary | ICD-10-CM

## 2022-12-21 DIAGNOSIS — M86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, left ankle and foot: Secondary | ICD-10-CM

## 2022-12-21 DIAGNOSIS — L97813 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: Secondary | ICD-10-CM | POA: Diagnosis not present

## 2022-12-21 DIAGNOSIS — I4891 Unspecified atrial fibrillation: Secondary | ICD-10-CM | POA: Diagnosis not present

## 2022-12-21 DIAGNOSIS — L97518 Non-pressure chronic ulcer of other part of right foot with other specified severity: Secondary | ICD-10-CM | POA: Diagnosis not present

## 2022-12-21 DIAGNOSIS — Z794 Long term (current) use of insulin: Secondary | ICD-10-CM | POA: Diagnosis not present

## 2022-12-21 DIAGNOSIS — I110 Hypertensive heart disease with heart failure: Secondary | ICD-10-CM | POA: Diagnosis not present

## 2022-12-21 LAB — GLUCOSE, CAPILLARY
Glucose-Capillary: 156 mg/dL — ABNORMAL HIGH (ref 70–99)
Glucose-Capillary: 126 mg/dL — ABNORMAL HIGH (ref 70–99)

## 2022-12-21 LAB — POCT INR: INR: 2 (ref 2.0–3.0)

## 2022-12-21 NOTE — Progress Notes (Addendum)
DENBY, LAUBENTHAL A ([id number]) [id number] Page 1 of 2 Visit Report for 12/21/2022 Arrival Information Details Patient Name: Date of Service: [name] [facility] A. 12/21/2022 10:00 A M Medical Record Number: [id number] Patient Account Number: [id number] Date of Birth/Sex: Treating RN: 06-29-1951 (72 y.o. [name] Primary Care Meshulem Onorato: [name] Other Clinician: [name] Referring Milbern Doescher: Treating Wilbur Oakland/Extender: [name] Ngetich, [name] in Treatment: 22 Visit Information History Since Last Visit All ordered tests and consults were completed: Yes Patient Arrived: Cane Added or deleted any medications: No Arrival Time: 09:30 Any new allergies or adverse reactions: No Accompanied By: daughter Had a fall or experienced change in No Transfer Assistance: None activities of daily living that may affect Patient Identification Verified: Yes risk of falls: Secondary Verification Process Completed: Yes Signs or symptoms of abuse/neglect since last visito No Patient Requires Transmission-Based Precautions: No Hospitalized since last visit: No Patient Has Alerts: Yes Implantable device outside of the clinic excluding No Patient Alerts: Patient on Blood Thinner cellular tissue based products placed in the center since last visit: Pain Present Now: No Electronic Signature(s) Signed: 12/21/2022 2:29:31 PM By: [name] CHT EMT BS , , Entered By: [name] on 12/21/2022 14:29:31 -------------------------------------------------------------------------------- Encounter Discharge Information Details Patient Name: Date of Service: [name] [name] [facility] A. 12/21/2022 10:00 A M Medical Record Number: [id number] Patient Account Number: [id number] Date of Birth/Sex: Treating RN: 04/13/1951 (72 y.o. [name] Primary Care Jamarcus Laduke: [name] Other Clinician: [name] Referring Ly Bacchi: Treating  Mykell Genao/Extender: [name] Ngetich, [name] in Treatment: 22 Encounter Discharge Information Items Discharge Condition: Stable Ambulatory Status: Cane Discharge Destination: Home Transportation: Private Auto Accompanied By: daughter Schedule Follow-up Appointment: No Clinical Summary of Care: Electronic Signature(s) Signed: 12/21/2022 2:53:36 PM By: [name] CHT EMT BS , , Entered By: [name] on 12/21/2022 14:53:35 [name] ([id number] Page 2 of 2 -------------------------------------------------------------------------------- Vitals Details Patient Name: Date of Service: [name] [facility] A. 12/21/2022 10:00 A M Medical Record Number: [id number] Patient Account Number: [id number] Date of Birth/Sex: Treating RN: November 26, 1950 (72 y.o. [name] Primary Care Ethie Curless: [name] Other Clinician: [name] Referring Kairah Leoni: Treating Alexandrya Chim/Extender: [name] Ngetich, [name] in Treatment: 22 Vital Signs Time Taken: 10:33 Temperature (F): 98.6 Height (in): 68 Pulse (bpm): 123 Weight (lbs): 185 Respiratory Rate (breaths/min): 18 Body Mass Index (BMI): 28.1 Blood Pressure (mmHg): 139/90 Capillary Blood Glucose (mg/dl): 156 Reference Range: 80 - 120 mg / dl Electronic Signature(s) Signed: 12/21/2022 2:30:16 PM By: [name] CHT EMT BS , , Entered By: [name] on 12/21/2022 14:30:16

## 2022-12-21 NOTE — Progress Notes (Addendum)
[name], [name] ([id number]) [id number] Page 1 of 2 Visit Report for 12/21/2022 HBO Details Patient Name: Date of Service: [name] [facility] [name]. 12/21/2022 10:00 [name]: [id number] Patient Account Number: [id number] Date of Birth/Sex: Treating RN: 04-27-1951 (72 y.o. [name] Primary Care Vuong Musa: [name] Other Clinician: [name] Referring Gracy Ehly: Treating Twain Stenseth/Extender: [name] Ngetich, [name] in Treatment: 22 HBO Treatment Course Details Treatment Course Number: 1 Ordering Ellana Kawa: [name] T Treatments Ordered: otal 40 HBO Treatment Start Date: 12/15/2022 HBO Indication: Diabetic Ulcer(s) of the Lower Extremity Standard/Conservative Wound Care tried and failed greater than or equal to 30 days Wound #2 Left, Plantar Foot HBO Treatment Details Treatment Number: 4 Patient Type: Outpatient Chamber Type: Monoplace Chamber Serial #: [id number] Treatment Protocol: 2.0 ATA with 90 minutes oxygen, with two 5 minute air breaks Treatment Details Compression Rate Down: 1.0 psi / minute De-Compression Rate Up: [name] breaks and breathing ir Compress Tx Pressure periods Decompress Decompress Begins Reached (leave unused spaces Begins Ends blank) Chamber Pressure (ATA 1 2 2 2 2 2 $ --2 1 ) Clock Time (24 hr) 11:04 11:18 11:48 11:53 12:23 12:38 - - 13:08 13:21 Treatment Length: 137 (minutes) Treatment Segments: 5 Vital Signs Capillary Blood Glucose Reference Range: 80 - 120 mg / dl HBO Diabetic Blood Glucose Intervention Range: <131 mg/dl or >249 mg/dl Type: Time Vitals Blood Pulse: Respiratory Capillary Blood Glucose Pulse Action Temperature: Taken: Pressure: Rate: Glucose (mg/dl): Meter #: Oximetry (%) Taken: Pre 10:33 139/90 123 18 98.6 156 Post 13:24 146/77 63 18 98.7 126 1 none per protocol Pre 10:35 98 manual pulse taken, irregular Treatment Response Treatment Toleration:  Well Treatment Completion Status: Treatment Completed without Adverse Event Treatment Notes Upon arrival, patient's blood glucose was measured at 156 mg/dL. Patient prepared for treatment. Chamber side other vitals were taken. pulse was measured at 123 bpm. I palpated pulse and counted 98 beats/min, irregular with [name] missing beat. Mr. [name] stated that he did not take his medicine this morning, namely metoprolol 50 mg. Informed physician and patient was cleared for treatment. After performing safety check, patient was placed in the chamber and chamber was compressed at 1 psi/min until verifying that the patient was experiencing normal ear equalization and then rate set was increased to 1.5 psi/min. Patient tolerated treatment and decompression of the chamber at 1.5 psi/min. Post treatment blood glucose was 126 mg/dL and post-treatment pulse was 63 bpm. Patient was stable upon discharge. Additional Procedure Documentation Tissue Sevierity: Necrosis of bone Physician HBO Attestation: I certify that I supervised this HBO treatment in accordance with Medicare guidelines. [name] trained emergency response team is readily available per Yes hospital policies and procedures. Continue HBOT as ordered. Yes JESSEE, KOOGLER [name] ([id number]) [id number] Page 2 of 2 Electronic Signature(s) Signed: 12/21/2022 3:45:51 PM By: [name] DO Previous Signature: 12/21/2022 2:52:47 PM Version By: [name] CHT EMT BS , , Entered By: [name] on 12/21/2022 15:45:25 -------------------------------------------------------------------------------- HBO Safety Checklist Details Patient Name: Date of Service: [name] [facility] [name]. 12/21/2022 10:00 [name]: [id number] Patient Account Number: [id number] Date of Birth/Sex: Treating RN: [birth year] (72 y.o. [name] Primary Care Kamri Gotsch: [name] Other Clinician: [name] Referring  Zavannah Deblois: Treating Janiqua Friscia/Extender: [name] Ngetich, [name] in Treatment: 22 HBO Safety Checklist Items Safety Checklist Consent Form Signed Patient voided / foley secured and emptied When did you last eato 0630 Last dose of injectable or oral agent Last  Evening Ostomy pouch emptied and vented if applicable NA All implantable devices assessed, documented and approved NA Intravenous access site secured and place R arm PICC Valuables secured Linens and cotton and cotton/polyester blend (less than 51% polyester) Personal oil-based products / skin lotions / body lotions removed Wigs or hairpieces removed NA Smoking or tobacco materials removed NA Books / newspapers / magazines / loose paper removed Cologne, aftershave, perfume and deodorant removed Jewelry removed (may wrap wedding band) Make-up removed NA Hair care products removed Battery operated devices (external) removed Heating patches and chemical warmers removed Titanium eyewear removed Nail polish cured greater than 10 hours NA Casting material cured greater than 10 hours NA Hearing aids removed NA Loose dentures or partials removed NA Prosthetics have been removed NA Patient demonstrates correct use of air break device (if applicable) Patient concerns have been addressed Patient grounding bracelet on and cord attached to chamber Specifics for Inpatients (complete in addition to above) Medication sheet sent with patient NA Intravenous medications needed or due during therapy sent with patient NA Drainage tubes (e.g. nasogastric tube or chest tube secured and vented) NA Endotracheal or Tracheotomy tube secured NA Cuff deflated of air and inflated with saline NA Airway suctioned NA Notes Paper version used prior to treatment. Electronic Signature(s) Signed: 12/24/2022 3:04:58 PM By: [name] CHT EMT BS , , Previous Signature: 12/21/2022 2:31:08 PM Version By: [name]  CHT EMT BS , , Entered By: [name] on 12/24/2022 15:04:58

## 2022-12-21 NOTE — Progress Notes (Signed)
[name], [name] ([id number]) [id number] Page 1 of 1 Visit Report for 12/21/2022 SuperBill Details Patient Name: Date of Service: [name]. 12/21/2022 Medical Record Number: [id number] Patient Account Number: [id number] Date of Birth/Sex: Treating RN: 1950/11/28 (72 y.o. 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, left ankle and foot Facility Procedures CPT4 Code Description Modifier Quantity [id number]-(Facility Use Only) HBOT full body chamber, 18mn , 5 ICD-10 Diagnosis Description E11.621 Type 2 diabetes mellitus with foot ulcer 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

## 2022-12-21 NOTE — Patient Instructions (Signed)
Description   Take 1.5 tablets today and then continue 1/2 tablet daily except for 1 tablet on Mondays, Wednesdays, and Fridays.  Recheck INR 4 weeks.  Coumad[facility] [phone].

## 2022-12-22 ENCOUNTER — Encounter (HOSPITAL_BASED_OUTPATIENT_CLINIC_OR_DEPARTMENT_OTHER): Payer: Medicare HMO | Admitting: Internal Medicine

## 2022-12-22 ENCOUNTER — Telehealth: Payer: Self-pay

## 2022-12-22 DIAGNOSIS — L97518 Non-pressure chronic ulcer of other part of right foot with other specified severity: Secondary | ICD-10-CM | POA: Diagnosis not present

## 2022-12-22 DIAGNOSIS — L97522 Non-pressure chronic ulcer of other part of left foot with fat layer exposed: Secondary | ICD-10-CM

## 2022-12-22 DIAGNOSIS — E1151 Type 2 diabetes mellitus with diabetic peripheral angiopathy without gangrene: Secondary | ICD-10-CM | POA: Diagnosis not present

## 2022-12-22 DIAGNOSIS — E11621 Type 2 diabetes mellitus with foot ulcer: Secondary | ICD-10-CM

## 2022-12-22 DIAGNOSIS — M86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, left ankle and foot: Secondary | ICD-10-CM | POA: Diagnosis not present

## 2022-12-22 DIAGNOSIS — B9689 Other specified bacterial agents as the cause of diseases classified elsewhere: Secondary | ICD-10-CM | POA: Diagnosis not present

## 2022-12-22 DIAGNOSIS — E1169 Type 2 diabetes mellitus with other specified complication: Secondary | ICD-10-CM | POA: Diagnosis not present

## 2022-12-22 DIAGNOSIS — I5022 Chronic systolic (congestive) heart failure: Secondary | ICD-10-CM | POA: Diagnosis not present

## 2022-12-22 DIAGNOSIS — L97813 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: Secondary | ICD-10-CM

## 2022-12-22 DIAGNOSIS — Z794 Long term (current) use of insulin: Secondary | ICD-10-CM | POA: Diagnosis not present

## 2022-12-22 DIAGNOSIS — I110 Hypertensive heart disease with heart failure: Secondary | ICD-10-CM | POA: Diagnosis not present

## 2022-12-22 DIAGNOSIS — B965 Pseudomonas (aeruginosa) (mallei) (pseudomallei) as the cause of diseases classified elsewhere: Secondary | ICD-10-CM | POA: Diagnosis not present

## 2022-12-22 DIAGNOSIS — L97529 Non-pressure chronic ulcer of other part of left foot with unspecified severity: Secondary | ICD-10-CM | POA: Diagnosis not present

## 2022-12-22 LAB — GLUCOSE, CAPILLARY
Glucose-Capillary: 145 mg/dL — ABNORMAL HIGH (ref 70–99)
Glucose-Capillary: 133 mg/dL — ABNORMAL HIGH (ref 70–99)

## 2022-12-22 NOTE — Telephone Encounter (Signed)
Cassie pharmacist with [employer] called stating patient has not had IV abx since 12/14/21.   I spoke with the patient's daughter [name] and she confirmed patient has not had IV abx since 12/14/21 due to timing and family member being sick. She stated patient has been taking Levaquin once daily, but unsure of the dose.   Patient daughter stated patient could start IV abx tonight. I advised her that Dr. Juleen China will discuss treatment plan with the patient at his appointment tomorrow and no need to start the IV abx tonight.  We will follow up with [employer] tomorrow.  Anaiyah Anglemyer T Brooks Sailors

## 2022-12-22 NOTE — Progress Notes (Signed)
ICHAEL, MCCARTER [name] ([id number]) [id number] Page 1 of 2 Visit Report for 12/22/2022 HBO Details Patient Name: Date of Service: [name] [facility] [name]. 12/22/2022 10:00 [name]: [id number] Patient Account Number: [id number] Date of Birth/Sex: Treating RN: [birth year] (72 y.o. [name] Primary Care Tona Qualley: [name] Other Clinician: Donavan [name] Referring [name]: Treating [name]/Extender: [name], [name] in Treatment: 22 HBO Treatment Course Details Treatment Course Number: 1 Ordering [name]: Darren Shan T Treatments Ordered: otal 40 HBO Treatment Start Date: 12/15/2022 HBO Indication: Diabetic Ulcer(s) of the Lower Extremity Standard/Conservative Wound Care tried and failed greater than or equal to 30 days Wound #2 Left, Plantar Foot HBO Treatment Details Treatment Number: 5 Patient Type: Outpatient Chamber Type: Monoplace Chamber Serial #: [id number] Treatment Protocol: 2.0 ATA with 90 minutes oxygen, with two 5 minute air breaks Treatment Details Compression Rate Down: 1.5 psi / minute De-Compression Rate Up: 2.0 psi / minute [name] breaks and breathing ir Compress Tx Pressure periods Decompress Decompress Begins Reached (leave unused spaces Begins Ends blank) Chamber Pressure (ATA 1 2 2 2 2 2 $ --2 1 ) Clock Time (24 hr) 10:23 10:33 11:03 11:08 11:38 11:43 - - 12:13 12:23 Treatment Length: 120 (minutes) Treatment Segments: 4 Vital Signs Capillary Blood Glucose Reference Range: 80 - 120 mg / dl HBO Diabetic Blood Glucose Intervention Range: <131 mg/dl or >249 mg/dl Type: Time Vitals Blood Respiratory Capillary Blood Glucose Pulse Action Pulse: Temperature: Taken: Pressure: Rate: Glucose (mg/dl): Meter #: Oximetry (%) Taken: Pre 10:01 151/98 122 18 98.1 145 1 8 oz Glucerna given. Post 12:32 137/71 63 18 97.6 133 1 none per protocol Treatment Response Treatment Toleration:  Well Treatment Completion Status: Treatment Completed without Adverse Event Treatment Notes Upon arrival, patient's blood glucose was measured at 145 mg/dL. Other vitals were taken as well; pulse was measured at 122 bpm. I palpated pulse and counted 120 beats/min. Patient stated that he felt fine. Informed Dr. Heber [name]. Patient cleared for treatment. Patient stated that he ate 4x4 cheese slices x 4 and Dr. Malachi [name]. Patient was given an 8 oz Glucerna based on this information. Patient dressed for treatment. After performing safety check, patient was placed in the chamber and chamber was compressed at 1 psi/min until verifying that the patient was experiencing normal ear equalization and then rate set was increased to 1.5 psi/min. Patient tolerated treatment and decompression of the chamber at 1.5 psi/min. Post treatment blood glucose was 133 mg/dL and post- treatment pulse was 63 bpm. Patient was stable upon discharge to wound care visit in the clinic. Additional Procedure Documentation Tissue Sevierity: Necrosis of bone Physician HBO Attestation: I certify that I supervised this HBO treatment in accordance with Medicare guidelines. [name] trained emergency response team is readily available per Yes hospital policies and procedures. Continue HBOT as ordered. Yes Electronic Signature(s) [name] ([id number]) [id number] Page 2 of 2 Signed: 12/22/2022 3:51:25 PM By: Darren Shan DO Previous Signature: 12/22/2022 2:21:26 PM Version By: [name] , , Entered By: Darren Shan on 12/22/2022 15:50:38 -------------------------------------------------------------------------------- HBO Safety Checklist Details Patient Name: Date of Service: [name] [facility] [name]. 12/22/2022 10:00 [name]: [id number] Patient Account Number: [id number] Date of Birth/Sex: Treating RN: February 19, 1951 (72 y.o. [name] Primary Care [name]:  [name] Other Clinician: Donavan [name] Referring [name]: Treating [name]/Extender: [name], [name] in Treatment: 22 HBO Safety Checklist Items Safety Checklist Consent Form Signed Patient voided /  foley secured and emptied When did you last eato 0800 Last dose of injectable or oral agent Last Evening Ostomy pouch emptied and vented if applicable NA All implantable devices assessed, documented and approved NA Intravenous access site secured and place Right Arm PICC Valuables secured Linens and cotton and cotton/polyester blend (less than 51% polyester) Personal oil-based products / skin lotions / body lotions removed Wigs or hairpieces removed NA Smoking or tobacco materials removed Books / newspapers / magazines / loose paper removed Cologne, aftershave, perfume and deodorant removed Jewelry removed (may wrap wedding band) Make-up removed NA Hair care products removed Battery operated devices (external) removed Heating patches and chemical warmers removed Titanium eyewear removed Nail polish cured greater than 10 hours NA Casting material cured greater than 10 hours NA Hearing aids removed NA Loose dentures or partials removed NA Prosthetics have been removed NA Patient demonstrates correct use of air break device (if applicable) Patient concerns have been addressed Patient grounding bracelet on and cord attached to chamber Specifics for Inpatients (complete in addition to above) Medication sheet sent with patient NA Intravenous medications needed or due during therapy sent with patient NA Drainage tubes (e.g. nasogastric tube or chest tube secured and vented) NA Endotracheal or Tracheotomy tube secured NA Cuff deflated of air and inflated with saline NA Airway suctioned NA Notes Paper version used prior to treatment. Electronic Signature(s) Signed: 12/22/2022 2:14:02 PM By: [name] , , Entered By:  [name] on 12/22/2022 14:14:01

## 2022-12-22 NOTE — Progress Notes (Signed)
JAVIEON, ENCISO A ([id number]) [id number] Page 1 of 2 Visit Report for 12/22/2022 Arrival Information Details Patient Name: Date of Service: [name] [facility] A. 12/22/2022 10:00 A M Medical Record Number: [id number] Patient Account Number: [id number] Date of Birth/Sex: Treating RN: June 02, 1951 (72 y.Darren. [name] Primary Care Anjolaoluwa Siguenza: [name] Other Clinician: [name] Referring Taheera Thomann: Treating Emmary Culbreath/Extender: [name] Ngetich, [name] in Treatment: 22 Visit Information History Since Last Visit All ordered tests and consults were completed: Yes Patient Arrived: Cane Added or deleted any medications: No Arrival Time: 09:32 Any new allergies or adverse reactions: No Accompanied By: daughter Had a fall or experienced change in No Transfer Assistance: None activities of daily living that may affect Patient Identification Verified: Yes risk of falls: Secondary Verification Process Completed: Yes Signs or symptoms of abuse/neglect since last visito No Patient Requires Transmission-Based Precautions: No Hospitalized since last visit: No Patient Has Alerts: Yes Implantable device outside of the clinic excluding No Patient Alerts: Patient on Blood Thinner cellular tissue based products placed in the center since last visit: Pain Present Now: No Electronic Signature(s) Signed: 12/22/2022 2:11:59 PM By: [name] CHT EMT BS , , Entered By: [name] on 12/22/2022 14:11:58 -------------------------------------------------------------------------------- Encounter Discharge Information Details Patient Name: Date of Service: [name] [facility] A. 12/22/2022 10:00 A M Medical Record Number: [id number] Patient Account Number: [id number] Date of Birth/Sex: Treating RN: Mar 30, 1951 (52 y.Darren. [name] Primary Care Ashok Sawaya: [name] Other Clinician: [name] Referring  Arieon Scalzo: Treating Josemanuel Eakins/Extender: [name] in Treatment: 22 Encounter Discharge Information Items Discharge Condition: Stable Ambulatory Status: Cane Discharge Destination: Home Transportation: Private Auto Accompanied By: Daughter Schedule Follow-up Appointment: No Clinical Summary of Care: Electronic Signature(s) Signed: 12/22/2022 2:22:17 PM By: [name] CHT EMT BS , , Entered By: [name] on 12/22/2022 14:22:17 Cordie Grice ([id number] Page 2 of 2 -------------------------------------------------------------------------------- Vitals Details Patient Name: Date of Service: [name] [facility] A. 12/22/2022 10:00 A M Medical Record Number: [id number] Patient Account Number: [id number] Date of Birth/Sex: Treating RN: 1951-05-11 (17 y.Darren. [name] Primary Care Trudy Kory: [name] Other Clinician: [name] Referring Dniyah Grant: Treating Demetries Coia/Extender: [name] Ngetich, [name] in Treatment: 22 Vital Signs Time Taken: 10:01 Temperature (F): 98.1 Height (in): 68 Pulse (bpm): 122 Weight (lbs): 185 Respiratory Rate (breaths/min): 18 Body Mass Index (BMI): 28.1 Blood Pressure (mmHg): 151/98 Capillary Blood Glucose (mg/dl): 145 Reference Range: 80 - 120 mg / dl Electronic Signature(s) Signed: 12/22/2022 2:12:36 PM By: [name] CHT EMT BS , , Entered By: [name] on 12/22/2022 14:12:35

## 2022-12-23 ENCOUNTER — Ambulatory Visit (INDEPENDENT_AMBULATORY_CARE_PROVIDER_SITE_OTHER): Payer: Medicare HMO | Admitting: Internal Medicine

## 2022-12-23 ENCOUNTER — Other Ambulatory Visit: Payer: Self-pay

## 2022-12-23 ENCOUNTER — Telehealth: Payer: Self-pay

## 2022-12-23 ENCOUNTER — Encounter (HOSPITAL_BASED_OUTPATIENT_CLINIC_OR_DEPARTMENT_OTHER): Payer: Medicare HMO | Admitting: Physician Assistant

## 2022-12-23 VITALS — BP 164/112 | HR 122 | Temp 97.6°F | Resp 16

## 2022-12-23 DIAGNOSIS — M86472 Chronic osteomyelitis with draining sinus, left ankle and foot: Secondary | ICD-10-CM | POA: Diagnosis not present

## 2022-12-23 DIAGNOSIS — Z794 Long term (current) use of insulin: Secondary | ICD-10-CM | POA: Diagnosis not present

## 2022-12-23 DIAGNOSIS — L97813 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: Secondary | ICD-10-CM | POA: Diagnosis not present

## 2022-12-23 DIAGNOSIS — I110 Hypertensive heart disease with heart failure: Secondary | ICD-10-CM | POA: Diagnosis not present

## 2022-12-23 DIAGNOSIS — L97518 Non-pressure chronic ulcer of other part of right foot with other specified severity: Secondary | ICD-10-CM | POA: Diagnosis not present

## 2022-12-23 DIAGNOSIS — L97524 Non-pressure chronic ulcer of other part of left foot with necrosis of bone: Secondary | ICD-10-CM | POA: Diagnosis not present

## 2022-12-23 DIAGNOSIS — M86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, left ankle and foot: Secondary | ICD-10-CM | POA: Diagnosis not present

## 2022-12-23 DIAGNOSIS — E1151 Type 2 diabetes mellitus with diabetic peripheral angiopathy without gangrene: Secondary | ICD-10-CM | POA: Diagnosis not present

## 2022-12-23 DIAGNOSIS — L97522 Non-pressure chronic ulcer of other part of left foot with fat layer exposed: Secondary | ICD-10-CM | POA: Diagnosis not present

## 2022-12-23 DIAGNOSIS — E1169 Type 2 diabetes mellitus with other specified complication: Secondary | ICD-10-CM | POA: Diagnosis not present

## 2022-12-23 DIAGNOSIS — E11621 Type 2 diabetes mellitus with foot ulcer: Secondary | ICD-10-CM | POA: Diagnosis not present

## 2022-12-23 LAB — GLUCOSE, CAPILLARY
Glucose-Capillary: 180 mg/dL — ABNORMAL HIGH (ref 70–99)
Glucose-Capillary: 193 mg/dL — ABNORMAL HIGH (ref 70–99)
Glucose-Capillary: 169 mg/dL — ABNORMAL HIGH (ref 70–99)

## 2022-12-23 NOTE — Patient Instructions (Signed)
Start doing the IV antibiotics tonight  Let us know if the schedule with wound care limits ability to continue doing this in the future  Folllow up in 4 weeks

## 2022-12-23 NOTE — Progress Notes (Signed)
[facility] for Infectious Disease  CHIEF COMPLAINT:    Follow up for osteomyelitis of left foot  SUBJECTIVE:    [name] is a 72 y.o. male with PMHx as below who presents to the clinic for osteomyelitis of left foot.    Patient is here today for scheduled follow up. Picc line was placed 12/14/22.  He has been going to wound care for his foot infection.  We were contacted by his home health pharmacist that he had not had any IV antibiotics since 12/14/22.  Apparently this was due to timing and a family member being sick.  He has continued to take his daily Levaquin as he had leftover pills from the podiatrist.  He also had arteriogram with vascular surgery on 12/10/22 where angioplasty of the left peroneal was performed.  He presents today for follow up and reports no issues tolerating the Levaquin 774m daily.  He reports no fevers, chills.  He is having less pain in his foot.  They have been working on off loading the foot.    HPI: Patient has a complex history including diabetes, chronic kidney disease, CHF, hypertension, and peripheral vascular disease.   He has had previous vascular surgery evaluation and status post angiogram in August 2023 with right PT angioplasty requiring retrograde pedal access for critical limb ischemia with tissue loss.  This wound is currently being followed closely by Dr. HHeber Carolinaat the wound clinic and has made significant progress per previous notes.   Patient has also had issues with a wound on his left lateral foot.  He previously underwent left peroneal angioplasty in July 2022 for left fifth toe gangrene requiring amputation.  This area has recently been complicated by the development of an ulcer which is complicated by osteomyelitis based on recent plain films as well as exam noting bone exposure.  He had a wound culture on 12/26 that is labeled "deep tissue" that grew Pseudomonas aeruginosa and Serratia marcescens.  He also had had a  culture on 11/10/2022 labeled "wound (site not specified)" that grew Pseudomonas aeruginosa and group B Streptococcus.  Per the notes, this was a bone biopsy taken in the podiatry clinic.  They discussed risk of limb loss and infection as well as surgery to remove infected bone versus long-term antibiotics.  Patient opted for the latter.  He also saw vascular surgery last week whom is planning for repeat angiogram with focus on the left leg given these more recent developments.   Patient also has a history of chronic kidney disease.  His most recent creatinine is from August 2023 at which point it was 1.91 with a creatinine clearance of approximately 47.  It appears his baseline is closer to 1.5-1.7. He is on Levaquin currently and prior to that was on Cipro.  He has been on a FQ since about 11/10/22.  They report the wound is improving and he has less pain.  He has no fevers or systemic symptoms.   Please see A&P for the details of today's visit and status of the patient's medical problems.   Patient's Medications  New Prescriptions   No medications on file  Previous Medications   APPLE CIDER VINEGAR PO    Take 30 mLs by mouth 4 (four) times a week.   ASHWAGANDHA PO    Take by mouth.   BLOOD GLUCOSE METER KIT AND SUPPLIES    Dispense based on patient and insurance preference. Use up to four  times daily as directed. (FOR ICD-10 E10.9, E11.9).   CLOPIDOGREL (PLAVIX) 75 MG TABLET    Take 1 tablet (75 mg total) by mouth daily.   DICLOFENAC SODIUM (VOLTAREN) 1 % GEL    Apply 1 Application topically 4 (four) times daily as needed (pain).   EMPAGLIFLOZIN (JARDIANCE) 10 MG TABS TABLET    TAKE 1 TABLET(10 MG) BY MOUTH DAILY   ENTRESTO 49-51 MG    TAKE 1 TABLET BY MOUTH TWICE DAILY   FUROSEMIDE (LASIX) 20 MG TABLET    TAKE 1 TABLET(20 MG) BY MOUTH TWICE DAILY   INSULIN GLARGINE (LANTUS) 100 UNIT/ML INJECTION    INJECT 8 UNITS UNDER THE SKIN DAILY   INSULIN SYRINGE-NEEDLE U-100 (INSULIN SYRINGE 1CC/30GX5/16")  30G X 5/16" 1 ML MISC    Use once daily for insulin injections. Dx:E11.22   METOPROLOL SUCCINATE (TOPROL XL) 50 MG 24 HR TABLET    Take 1 tablet (50 mg total) by mouth daily.   MISC NATURAL PRODUCTS (JOINT SUPPORT PO)    Take 1 Dose by mouth 3 (three) times a week.   SPIRONOLACTONE (ALDACTONE) 25 MG TABLET    Take 0.5 tablets (12.5 mg total) by mouth daily.   WARFARIN (COUMADIN) 6 MG TABLET    Take 0.5-1 tablets (3-6 mg total) by mouth See admin instructions. Take 6 mg on Mon and Fri Take 3 mg on Sun, Tues, Wed, Thurs, and Sat  Modified Medications   No medications on file  Discontinued Medications   No medications on file      Past Medical History:  Diagnosis Date   A-fib [facility])    a. (06/04/14) TEE-DC-CV; succesful; large LA 6.2 cm   ADHD    Ascites    Athlete's foot    Chronic systolic heart failure ([place])    a. 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, unspecified CKD stage, unspecified whether long term insulin use (HCC)    Heart murmur    HTN (hypertension)    OSA (obstructive sleep apnea)    PVD (peripheral vascular disease) (HCC)    s/p great toe amputation    Social History   Tobacco Use   Smoking status: Some Days    Years: 52.00    Types: Cigarettes, Cigars    Last attempt to quit: 05/2021    Years since quitting: 1.6    Passive exposure: Never   Smokeless tobacco: Never  Vaping Use   Vaping Use: Never used  Substance Use Topics   Alcohol use: Not Currently   Drug use: Never    Family History  Problem Relation Age of Onset   Heart attack Mother        deceased   Diabetes Mother    Heart attack Sister     No Known Allergies  Review of Systems  All other systems reviewed and are negative.    OBJECTIVE:    Vitals:   12/23/22 1540  BP: (!)  164/112  Pulse: (!) 122  Resp: 16  Temp: 97.6 F (36.4 C)  TempSrc: Oral  SpO2: 98%   There is no height or weight on file to calculate BMI.  Physical Exam Constitutional:      Appearance: Normal appearance.  Musculoskeletal:     Comments: PICC in place. Darco shoe  on both feet  Skin:    General: Skin is warm and dry.  Neurological:     General: No focal deficit present.     Mental Status: He is alert and oriented to person, place, and time.  Psychiatric:        Mood and Affect: Mood normal.        Behavior: Behavior normal.      Labs and Microbiology:    Latest Ref Rng & Units 12/10/2022    6:27 AM 12/02/2022    2:19 PM 06/18/2022   10:29 AM  CBC  WBC 3.8 - 10.8 Thousand/uL  7.4  9.3   Hemoglobin 13.0 - 17.0 g/dL 14.3  12.7  13.1   Hematocrit 39.0 - 52.0 % 42.0  37.9  39.7   Platelets 140 - 400 Thousand/uL  161  222       Latest Ref Rng & Units 12/10/2022    6:27 AM 12/02/2022    2:19 PM 06/18/2022   10:29 AM  CMP  Glucose 70 - 99 mg/dL 130  141  124   BUN 8 - 23 mg/dL 30  29  32   Creatinine 0.61 - 1.24 mg/dL 1.90  1.62  1.91   Sodium 135 - 145 mmol/L 143  141  140   Potassium 3.5 - 5.1 mmol/L 4.0  4.5  4.5   Chloride 98 - 111 mmol/L 103  106  105   CO2 20 - 32 mmol/L  29  24   Calcium 8.6 - 10.3 mg/dL  9.1  9.0   Total Protein 6.1 - 8.1 g/dL  7.3  7.6   Total Bilirubin 0.2 - 1.2 mg/dL  0.7  0.5   AST 10 - 35 U/L  14  17   ALT 9 - 46 U/L  16  22        ASSESSMENT & PLAN:    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  He has been non-adherent to IV therapy since having PICC line placed but has continued to take Levaquin 739m daily.  He continues with wound care and has now been optimized from a vascular standpoint after revascularization on 12/10/22.  We discussed his severity of infection and that he is at high risk of amputation in the future.  He wants to be as aggressive as  possible to avoid surgery.  Discussed IV vs PO therapy at this point and he would like to try IV again now that his wound care schedule and family obligations have settled down.  Will plan for 4 weeks of Cefepime from today as he has been treated the last 2 weeks with Levaquin and will use the date of his vascular surgery as day #1 of antibiotics.  Follow up in 4 weeks.  If he is unable to keep up with IV therapy, discussed that we could transition to oral therapy early.  Diagnosis: Osteo  Culture Result: pseudomonas  No Known Allergies  OPAT Orders Discharge antibiotics to be given via PICC line Discharge antibiotics: Per pharmacy protocol  Cefepime 2 gm q12h Duration: 4 weeks End Date: 01/20/23  [facility]  Home health RN for IV administration and teaching; PICC line care and labs.    Labs weekly while on IV antibiotics: _xxx_ CBC with differential __ BMP _xxx_ CMP _xxx_ CRP _xxx_ ESR __ Vancomycin trough __ CK  _xxx_ Please pull PIC at completion of IV  antibiotics __ Please leave PIC in place until doctor has seen patient or been notified  Fax weekly labs to [phone]  Clinic Follow Up Appt: 4 weeks   No orders of the defined types were placed in this encounter.       Raynelle Highland for Infectious Disease [facility] 12/23/2022, 4:02 PM  I have personally spent 40 minutes involved in face-to-face and non-face-to-face activities for this patient on the day of the visit. Professional time spent includes the following activities: Preparing to see the patient (review of tests), Obtaining and/or reviewing separately obtained history (admission/discharge record), Performing a medically appropriate examination and/or evaluation , Ordering medications/tests/procedures, referring and communicating with other health care professionals, Documenting clinical information in the EMR, Independently interpreting results (not  separately reported), Communicating results to the patient/family/caregiver, Counseling and educating the patient/family/caregiver and Care coordination (not separately reported).

## 2022-12-23 NOTE — Assessment & Plan Note (Signed)
Patient here today for follow up for left foot osteomyelitis.  He has been non-adherent to IV therapy since having PICC line placed but has continued to take Levaquin 715m daily.  He continues with wound care and has now been optimized from a vascular standpoint after revascularization on 12/10/22.  We discussed his severity of infection and that he is at high risk of amputation in the future.  He wants to be as aggressive as possible to avoid surgery.  Discussed IV vs PO therapy at this point and he would like to try IV again now that his wound care schedule and family obligations have settled down.  Will plan for 4 weeks of Cefepime from today as he has been treated the last 2 weeks with Levaquin and will use the date of his vascular surgery as day #1 of antibiotics.  Follow up in 4 weeks.  If he is unable to keep up with IV therapy, discussed that we could transition to oral therapy early.  Diagnosis: Osteo  Culture Result: pseudomonas  No Known Allergies  OPAT Orders Discharge antibiotics to be given via PICC line Discharge antibiotics: Per pharmacy protocol  Cefepime 2 gm q12h Duration: 4 weeks End Date: 01/20/23  [facility]  [facility] health RN for IV administration and teaching; PICC line care and labs.    Labs weekly while on IV antibiotics: _xxx_ CBC with differential __ BMP _xxx_ CMP _xxx_ CRP _xxx_ ESR __ Vancomycin trough __ CK  _xxx_ Please pull PIC at completion of IV antibiotics __ Please leave PIC in place until doctor has seen patient or been notified  Fax weekly labs to [phone] Clinic Follow Up Appt: 4 weeks

## 2022-12-23 NOTE — Telephone Encounter (Signed)
New OPAT orders per Dr. Juleen China , orders shared with Carolynn Sayers, RN at [facility] and [place] staff.   IR appointment: PICC line already in place   First dose: no need for monitoring during first dose per provider

## 2022-12-23 NOTE — Progress Notes (Signed)
[name], SEARCH A ([id number]) [id number] Page 1 of 1 Visit Report for 12/22/2022 SuperBill Details Patient Name: Date of Service: [name] [facility] A. 12/22/2022 Medical Record Number: [id number] Patient Account Number: [id number] Date of Birth/Sex: Treating RN: 1951-02-12 (72 y.o. 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 M86.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, left ankle and foot Facility Procedures CPT4 Code Description Modifier Quantity [id number] G0277-(Facility Use Only) HBOT full body chamber, 61mn , 4 ICD-10 Diagnosis Description E11.621 Type 2 diabetes mellitus with foot ulcer L97.522 Non-pressure chronic ulcer of other part of left foot with fat layer exposed M86.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, left ankle and foot Physician Procedures Quantity CPT4 Code Description Modifier [id number] - WC PHYS HYPERBARIC OXYGEN THERAPY 1 ICD-10 Diagnosis Description E11.621 Type 2 diabetes mellitus with foot ulcer L97.522 Non-pressure chronic ulcer of other part of left foot with fat layer exposed M86.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, left ankle and foot Electronic Signature(s) Signed: 12/22/2022 2:21:51 PM By: SDonavan BurnetCHT EMT BS , , Signed: 12/22/2022 3:51:25 PM By: HKalman ShanDO Entered By: SDonavan Burneton 12/22/2022 14:21:50

## 2022-12-24 ENCOUNTER — Encounter (HOSPITAL_BASED_OUTPATIENT_CLINIC_OR_DEPARTMENT_OTHER): Payer: Medicare HMO | Admitting: Internal Medicine

## 2022-12-24 ENCOUNTER — Ambulatory Visit: Payer: Medicare HMO | Admitting: Internal Medicine

## 2022-12-24 ENCOUNTER — Other Ambulatory Visit: Payer: Self-pay | Admitting: *Deleted

## 2022-12-24 DIAGNOSIS — I110 Hypertensive heart disease with heart failure: Secondary | ICD-10-CM | POA: Diagnosis not present

## 2022-12-24 DIAGNOSIS — L97813 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: Secondary | ICD-10-CM | POA: Diagnosis not present

## 2022-12-24 DIAGNOSIS — M86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, left ankle and foot: Secondary | ICD-10-CM

## 2022-12-24 DIAGNOSIS — Z794 Long term (current) use of insulin: Secondary | ICD-10-CM | POA: Diagnosis not present

## 2022-12-24 DIAGNOSIS — E1169 Type 2 diabetes mellitus with other specified complication: Secondary | ICD-10-CM | POA: Diagnosis not present

## 2022-12-24 DIAGNOSIS — L97522 Non-pressure chronic ulcer of other part of left foot with fat layer exposed: Secondary | ICD-10-CM

## 2022-12-24 DIAGNOSIS — E11621 Type 2 diabetes mellitus with foot ulcer: Secondary | ICD-10-CM | POA: Diagnosis not present

## 2022-12-24 DIAGNOSIS — L97518 Non-pressure chronic ulcer of other part of right foot with other specified severity: Secondary | ICD-10-CM | POA: Diagnosis not present

## 2022-12-24 DIAGNOSIS — I739 Peripheral vascular disease, unspecified: Secondary | ICD-10-CM

## 2022-12-24 DIAGNOSIS — E1151 Type 2 diabetes mellitus with diabetic peripheral angiopathy without gangrene: Secondary | ICD-10-CM | POA: Diagnosis not present

## 2022-12-24 LAB — GLUCOSE, CAPILLARY
Glucose-Capillary: 182 mg/dL — ABNORMAL HIGH (ref 70–99)
Glucose-Capillary: 139 mg/dL — ABNORMAL HIGH (ref 70–99)

## 2022-12-24 NOTE — Progress Notes (Signed)
Darren, [name] [name] [id number] Page 1 of 2 Visit Report for 12/23/2022 Arrival Information Details Patient Name: Date of Service: [name] [facility] [name]. 12/23/2022 10:00 [name]: [id number] Patient Account Number: [id number] Date of Birth/Sex: Treating RN: 12/17/1950 (72 y.Darren. [name] Primary Care Rashae Rother: [name] Other Clinician: [name] Referring Caelynn Marshman: Treating Mehek Grega/Extender: Tyron Russell, [name] in Treatment: 22 Visit Information History Since Last Visit All ordered tests and consults were completed: Yes Patient Arrived: Cane Added or deleted any medications: No Arrival Time: 09:30 Any new allergies or adverse reactions: No Accompanied By: daughter Had [name] fall or experienced change in No Transfer Assistance: None activities of daily living that may affect Patient Identification Verified: Yes risk of falls: Secondary Verification Process Completed: Yes Signs or symptoms of abuse/neglect since last visito No Patient Requires Transmission-Based Precautions: No Hospitalized since last visit: No Patient Has Alerts: Yes Implantable device outside of the clinic excluding No Patient Alerts: Patient on Blood Thinner cellular tissue based products placed in the center since last visit: Pain Present Now: No Electronic Signature(s) Signed: 12/23/2022 2:31:44 PM By: [name] CHT EMT BS , , Entered By: [name] on 12/23/2022 14:31:43 -------------------------------------------------------------------------------- Encounter Discharge Information Details Patient Name: Date of Service: [name] [facility] [name]. 12/23/2022 10:00 [name]: [id number] Patient Account Number: [id number] Date of Birth/Sex: Treating RN: 06-23-1951 (72 y.Darren. [name] Primary Care Kenai Fluegel: [name] Other Clinician: [name] Referring  Benigna Delisi: Treating Panagiotis Oelkers/Extender: Tyron Russell, Doneta Public in Treatment: 22 Encounter Discharge Information Items Discharge Condition: Stable Ambulatory Status: Cane Discharge Destination: Home Transportation: Private Auto Accompanied By: daughter Schedule Follow-up Appointment: No Clinical Summary of Care: Electronic Signature(s) Signed: 12/23/2022 2:48:00 PM By: [name] CHT EMT BS , , Entered By: [name] on 12/23/2022 14:48:00 [name] ([id number] Page 2 of 2 -------------------------------------------------------------------------------- Vitals Details Patient Name: Date of Service: [name] [facility] [name]. 12/23/2022 10:00 [name]: [id number] Patient Account Number: [id number] Date of Birth/Sex: Treating RN: 06-18-1951 (72 y.Darren. [name] Primary Care Mayerly Kaman: [name] Other Clinician: [name] Referring Zubayr Bednarczyk: Treating Kiernan Atkerson/Extender: Worthy Keeler Ngetich, [name] in Treatment: 22 Vital Signs Time Taken: 10:26 Temperature (F): 98.4 Height (in): 68 Pulse (bpm): 120 Weight (lbs): 185 Respiratory Rate (breaths/min): 16 Body Mass Index (BMI): 28.1 Blood Pressure (mmHg): 143/86 Capillary Blood Glucose (mg/dl): 180 Reference Range: 80 - 120 mg / dl Electronic Signature(s) Signed: 12/23/2022 2:37:45 PM By: [name] CHT EMT BS , , Entered By: [name] on 12/23/2022 14:37:44

## 2022-12-25 ENCOUNTER — Encounter (HOSPITAL_BASED_OUTPATIENT_CLINIC_OR_DEPARTMENT_OTHER): Payer: Medicare HMO | Admitting: Internal Medicine

## 2022-12-25 NOTE — Progress Notes (Signed)
[name], [name] ([id number]) [id number] Page 1 of 1 Visit Report for 12/23/2022 SuperBill Details Patient Name: Date of Service: [name] [facility] A. 12/23/2022 Medical Record Number: [id number] Patient Account Number: [id number] Date of Birth/Sex: Treating RN: 1951/07/24 (72 y.o. 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 M86.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, left ankle and foot Facility Procedures CPT4 Code Description Modifier Quantity [id number] G0277-(Facility Use Only) HBOT full body chamber, 14mn , 4 ICD-10 Diagnosis Description E11.621 Type 2 diabetes mellitus with foot ulcer L97.522 Non-pressure chronic ulcer of other part of left foot with fat layer exposed M86.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, left ankle and foot Physician Procedures Quantity CPT4 Code Description Modifier [id number] - WC PHYS HYPERBARIC OXYGEN THERAPY 1 ICD-10 Diagnosis Description E11.621 Type 2 diabetes mellitus with foot ulcer L97.522 Non-pressure chronic ulcer of other part of left foot with fat layer exposed M86.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, left ankle and foot Electronic Signature(s) Signed: 12/23/2022 5:47:06 PM By: SWorthy KeelerPA-C Previous Signature: 12/23/2022 2:47:14 PM Version By: SDonavan BurnetCHT EMT BS , , Entered By: SWorthy Keeleron 12/23/2022 17:47:05

## 2022-12-25 NOTE — Progress Notes (Signed)
[name], [name] ([id number]) [id number] Page 1 of 3 Visit Report for 12/23/2022 HBO Details Patient Name: Date of Service: [name] [facility] [name]. 12/23/2022 10:00 [name]: [id number] Patient Account Number: [id number] Date of Birth/Sex: Treating RN: 1951-08-13 (72 y.Darren. [name] Primary Care [name]: [name] Other Clinician: Donavan [name] Referring [name]: Treating [name]/Extender: [name] HBO Treatment Course Details Treatment Course Number: 1 Ordering [name]: [name] T Treatments Ordered: otal 40 HBO Treatment Start Date: 12/15/2022 HBO Indication: Diabetic Ulcer(s) of the Lower Extremity Standard/Conservative Wound Care tried and failed greater than or equal to 30 days Wound #2 Left, Plantar Foot HBO Treatment Details Treatment Number: 6 Patient Type: Outpatient Chamber Type: Monoplace Chamber Serial #: [id number] Treatment Protocol: 2.0 ATA with 90 minutes oxygen, with two 5 minute air breaks Treatment Details Compression Rate Down: 1.0 psi / minute De-Compression Rate Up: 1.5 psi / minute [name] breaks and breathing ir Compress Tx Pressure periods Decompress Decompress Begins Reached (leave unused spaces Begins Ends blank) Chamber Pressure (ATA 1 2 2 2 2 2 $ --2 1 ) Clock Time (24 hr) 10:50 11:03 11:33 11:38 12:08 12:13 - - 12:43 12:55 Treatment Length: 125 (minutes) Treatment Segments: 4 Vital Signs Capillary Blood Glucose Reference Range: 80 - 120 mg / dl HBO Diabetic Blood Glucose Intervention Range: <131 mg/dl or >249 mg/dl Type: Time Vitals Blood Pulse: Respiratory Temperature: Capillary Blood Glucose Pulse Action Taken: Pressure: Rate: Glucose (mg/dl): Meter #: Oximetry (%) Taken: Pre 10:26 143/86 120 16 98.4 180 CBG re-measured for safety d/t today's intake hx Post 12:58 148/80 84 18 97.8 169 1 Pre 10:28 193 1 none per protocol. Treatment  Response Treatment Toleration: Well Treatment Completion Status: Treatment Completed without Adverse Event Treatment Notes Upon arrival, patient's blood glucose was measured at 80 mg/dL. Patient stated that he ate cake, green beans, rice. Patient dressed for treatment. Other vitals were taken as well; pulse was measured at 120 bpm. I palpated pulse and counted 114 beats/min. Patient stated that he felt fine. Informed [name] spoke with patient. Patient cleared for treatment. After performing safety check, patient was placed in the chamber and chamber was compressed at 1 psi/min until verifying that the patient was experiencing normal ear equalization and then rate set was increased to 1.5 psi/min. Patient tolerated treatment and decompression of the chamber at 1.5 psi/min. Post treatment blood glucose was 169 mg/dL and post-treatment pulse was 84 bpm. Patient was stable upon discharge. Additional Procedure Documentation Tissue Sevierity: Necrosis of bone [name] Notes Upon evaluation today patient's pulse upon arrival to clinic today prior to HBO was at 120 I did check this manually got 114 after listening for 30 seconds and doubling the count. With that being said this obviously is something that is I think in relation to his medication and how he is taking this. He is on metoprolol 50 mg [name] day that was recently changed by his cardiologist prior to beginning HBO therapy. With that being said he tells me that he did not take this this morning which may be 1 reason why his heart rate was somewhat up. Nonetheless I wonder if he may not need [name] better way to stabilize his readings in general so that he is more level and I recommended that he probably wants to talk to his cardiologist in this regard and see if there is anything they can do to help out from that standpoint.  Again on today's evaluation before going into the chamber he was at 120, now he was in 64. Nonetheless  that is not terrible but the 120 is not something that I like to see. All of this was discussed with the patient today and he tells me he is going to talk to his primary care [name] as well [name], [name] ([id number]) [id number] Page 2 of 3 regarding this. Physician HBO Attestation: I certify that I supervised this HBO treatment in accordance with Medicare guidelines. [name] trained emergency response team is readily available per Yes hospital policies and procedures. Continue HBOT as ordered. Yes Electronic Signature(s) Signed: 12/23/2022 5:46:58 PM By: Darren Keeler PA-C Previous Signature: 12/23/2022 2:46:53 PM Version By: [name] CHT EMT BS , , Previous Signature: 12/23/2022 2:46:27 PM Version By: [name] CHT EMT BS , , Entered By: [name] on 12/23/2022 17:46:58 -------------------------------------------------------------------------------- HBO Safety Checklist Details Patient Name: Date of Service: [name]. 12/23/2022 10:00 [name]: [id number] Patient Account Number: [id number] Date of Birth/Sex: Treating RN: 1951-10-01 (65 y.Darren. [name] Primary Care [name]: [name] Other Clinician: Donavan [name] Referring Kaydynce Pat: Treating [name]/Extender: [name] in Treatment: [name] HBO Safety Checklist Items Safety Checklist Consent Form Signed Patient voided / foley secured and emptied When did you last eato 0810 Last dose of injectable or oral agent last evening Ostomy pouch emptied and vented if applicable NA All implantable devices assessed, documented and approved NA Intravenous access site secured and place R arm PICC Valuables secured Linens and cotton and cotton/polyester blend (less than 51% polyester) Personal oil-based products / skin lotions / body lotions removed Wigs or hairpieces removed NA Smoking or tobacco materials  removed NA Books / newspapers / magazines / loose paper removed Cologne, aftershave, perfume and deodorant removed Jewelry removed (may wrap wedding band) Make-up removed NA Hair care products removed Battery operated devices (external) removed Heating patches and chemical warmers removed Titanium eyewear removed Nail polish cured greater than 10 hours NA Casting material cured greater than 10 hours NA Hearing aids removed NA Loose dentures or partials removed NA Prosthetics have been removed NA Patient demonstrates correct use of air break device (if applicable) Patient concerns have been addressed Patient grounding bracelet on and cord attached to chamber Specifics for Inpatients (complete in addition to above) Medication sheet sent with patient NA Intravenous medications needed or due during therapy sent with patient NA Drainage tubes (e.g. nasogastric tube or chest tube secured and vented) NA Endotracheal or Tracheotomy tube secured NA Cuff deflated of air and inflated with saline NA Airway suctioned NA YANG, GEGENHEIMER [name] ([id number] Page 3 of 3 Notes Paper version used prior to treatment. Electronic Signature(s) Signed: 12/23/2022 2:39:44 PM By: [name] CHT EMT BS , , Entered By: [name] on 12/23/2022 14:39:43

## 2022-12-25 NOTE — Progress Notes (Signed)
SEN, HARPENAU [name] ([id number]) [id number] Page 1 of 2 Visit Report for 12/24/2022 Arrival Information Details Patient Name: Date of Service: [name] [facility] [name]. 12/24/2022 10:00 [name]: [id number] Patient Account Number: [id number] Date of Birth/Sex: Treating RN: 24-Feb-1951 (72 y.Darren. [name] Primary Care Rosemary Mossbarger: [name] Other Clinician: [name] Referring Surah Pelley: Treating Susy Placzek/Extender: [name] in Treatment: 23 Visit Information History Since Last Visit All ordered tests and consults were completed: Yes Patient Arrived: Cane Added or deleted any medications: No Arrival Time: 09:30 Any new allergies or adverse reactions: No Accompanied By: daughter Had [name] fall or experienced change in No Transfer Assistance: None activities of daily living that may affect Patient Identification Verified: Yes risk of falls: Secondary Verification Process Completed: Yes Signs or symptoms of abuse/neglect since last visito No Patient Requires Transmission-Based Precautions: No Hospitalized since last visit: No Patient Has Alerts: Yes Implantable device outside of the clinic excluding No Patient Alerts: Patient on Blood Thinner cellular tissue based products placed in the center since last visit: Pain Present Now: No Electronic Signature(s) Signed: 12/24/2022 12:14:32 PM By: [name] CHT EMT BS , , Entered By: [name] on 12/24/2022 12:14:32 -------------------------------------------------------------------------------- Encounter Discharge Information Details Patient Name: Date of Service: [name] [facility] [name]. 12/24/2022 10:00 [name]: [id number] Patient Account Number: [id number] Date of Birth/Sex: Treating RN: 1951/09/12 (37 y.Darren. [name] Primary Care Zafir Schauer: [name] Other Clinician: [name] Referring Peytan Andringa: Treating  Lyberti Thrush/Extender: [name] in Treatment: 55 Encounter Discharge Information Items Discharge Condition: Stable Ambulatory Status: Cane Discharge Destination: Home Transportation: Private Auto Accompanied By: daughter Schedule Follow-up Appointment: No Clinical Summary of Care: Electronic Signature(s) Signed: 12/24/2022 3:04:03 PM By: [name] CHT EMT BS , , Entered By: [name] on 12/24/2022 15:04:03 [name] ([id number] Page 2 of 2 -------------------------------------------------------------------------------- Vitals Details Patient Name: Date of Service: [name] [facility] [name]. 12/24/2022 10:00 [name]: [id number] Patient Account Number: [id number] Date of Birth/Sex: Treating RN: Apr 05, 1951 (88 y.Darren. [name] Primary Care Mitsuru Dault: [name] Other Clinician: [name] Referring Taiwana Willison: Treating Alani Lacivita/Extender: [name] Ngetich, [name] in Treatment: 23 Vital Signs Time Taken: 10:40 Temperature (F): 98.4 Height (in): 68 Pulse (bpm): 121 Weight (lbs): 185 Respiratory Rate (breaths/min): 18 Body Mass Index (BMI): 28.1 Blood Pressure (mmHg): 155/100 Capillary Blood Glucose (mg/dl): 182 Reference Range: 80 - 120 mg / dl Electronic Signature(s) Signed: 12/24/2022 12:15:17 PM By: [name] CHT EMT BS , , Entered By: [name] on 12/24/2022 12:15:17

## 2022-12-25 NOTE — Progress Notes (Signed)
TREON, KIRACOFE A ([id number]) [id number] Page 1 of 14 Visit Report for 12/22/2022 Chief Complaint Document Details Patient Name: Date of Service: [name] [facility] A. 12/22/2022 12:45 PM Medical Record Number: [id number] Patient Account Number: [id number] Date of Birth/Sex: Treating RN: 02-Aug-1951 (72 y.o. M) Primary Care Provider: [name] Other Clinician: Referring Provider: Treating Provider/Extender: [name], [name] in Treatment: 22 Information Obtained from: Patient Chief Complaint 07/16/2022; bilateral lower extremity wounds Electronic Signature(s) Signed: 12/22/2022 1:46:46 PM By: [name] DO Entered By: [name] on 12/22/2022 13:39:54 -------------------------------------------------------------------------------- Cellular or Tissue Based Product Details Patient Name: Date of Service: [name] [name] [facility] A. 12/22/2022 12:45 PM Medical Record Number: [id number] Patient Account Number: [id number] Date of Birth/Sex: Treating RN: 04/06/1951 (72 y.o. [name] Primary Care Provider: [name] Other Clinician: Referring Provider: Treating Provider/Extender: [name] in Treatment: 22 Cellular or Tissue Based Product Type Wound #1 Right Achilles Applied to: Performed By: Physician [name], DO Cellular or Tissue Based Product Type: Grafix prime Level of Consciousness (Pre-procedure): Awake and Alert Pre-procedure Verification/Time Out Yes - 13:20 Taken: Location: trunk / arms / legs Wound Size (sq cm): 0.4 Product Size (sq cm): 2 Waste Size (sq cm): 0 Amount of Product Applied (sq cm): 2 Instrument Used: Forceps, Scissors Lot #: [id number] Order #: 11 Expiration Date: 01/20/2024 Fenestrated: No Reconstituted: Yes Solution Type: normal saline Solution Amount: 10 mL Lot #: [id number] Solution Expiration Date: 04/08/2025 Secured: Yes Secured With:  Steri-Strips, adaptic Dressing Applied: No Procedural Pain: 0 Post Procedural Pain: 0 Response to Treatment: Procedure was tolerated well Level of Consciousness (Post- Awake and Alert procedure): WHARTON, DITTOE A ([id number]) [id number] Page 2 of 14 Post Procedure Diagnosis Same as Pre-procedure Electronic Signature(s) Signed: 12/22/2022 1:46:46 PM By: [name] DO Signed: 12/23/2022 5:48:24 PM By: Deon Pilling RN, BSN Entered By: Deon Pilling on 12/22/2022 13:24:05 -------------------------------------------------------------------------------- Debridement Details Patient Name: Date of Service: [name] [facility] A. 12/22/2022 12:45 PM Medical Record Number: [id number] Patient Account Number: [id number] Date of Birth/Sex: Treating RN: 05-Sep-1951 (72 y.o. [name] Primary Care Provider: [name] Other Clinician: Referring Provider: Treating Provider/Extender: [name] Ngetich, [name] in Treatment: 22 Debridement Performed for Assessment: Wound #1 Right Achilles Performed By: Physician [name], DO Debridement Type: Debridement Severity of Tissue Pre Debridement: Fat layer exposed Level of Consciousness (Pre-procedure): Awake and Alert Pre-procedure Verification/Time Out Yes - 13:10 Taken: Start Time: 13:11 Pain Control: Lidocaine 4% T opical Solution T Area Debrided (L x W): otal 4.5 (cm) x 2 (cm) = 9 (cm) Tissue and other material debrided: Non-Viable, Callus, Skin: Dermis , Skin: Epidermis Level: Skin/Epidermis Debridement Description: Selective/Open Wound Instrument: Curette Bleeding: Minimum Hemostasis Achieved: Pressure End Time: 13:18 Procedural Pain: 0 Post Procedural Pain: 0 Response to Treatment: Procedure was tolerated well Level of Consciousness (Post- Awake and Alert procedure): Post Debridement Measurements of Total Wound Length: (cm) 1 Width: (cm) 0.3 Depth: (cm) 0.1 Volume:  (cm) 0.024 Character of Wound/Ulcer Post Debridement: Improved Severity of Tissue Post Debridement: Fat layer exposed Post Procedure Diagnosis Same as Pre-procedure Electronic Signature(s) Signed: 12/22/2022 1:46:46 PM By: [name] DO Signed: 12/23/2022 5:48:24 PM By: Deon Pilling RN, BSN Entered By: Deon Pilling on 12/22/2022 13:19:55 Debridement Details -------------------------------------------------------------------------------- Cordie Grice ([id number] Page 3 of 14 Patient Name: Date of Service: [name] [facility] A. 12/22/2022 12:45 PM Medical Record Number: [id number] Patient Account Number: [id number] Date of Birth/Sex: Treating RN:  [birth year] (72 y.o. [name] Primary Care Provider: [name] Other Clinician: Referring Provider: Treating Provider/Extender: [name] Ngetich, [name] in Treatment: 22 Debridement Performed for Assessment: Wound #2 Left,Plantar Foot Performed By: Physician [name], DO Debridement Type: Debridement Severity of Tissue Pre Debridement: Fat layer exposed Level of Consciousness (Pre-procedure): Awake and Alert Pre-procedure Verification/Time Out Yes - 13:10 Taken: Start Time: 13:11 Pain Control: Lidocaine 4% T opical Solution T Area Debrided (L x W): otal 0.5 (cm) x 0.5 (cm) = 0.25 (cm) Tissue and other material debrided: Viable, Non-Viable, Callus, Slough, Subcutaneous, Skin: Dermis , Skin: Epidermis, Slough Level: Skin/Subcutaneous Tissue Debridement Description: Excisional Instrument: Curette Bleeding: Minimum Hemostasis Achieved: Pressure End Time: 13:18 Procedural Pain: 0 Post Procedural Pain: 0 Response to Treatment: Procedure was tolerated well Level of Consciousness (Post- Awake and Alert procedure): Post Debridement Measurements of Total Wound Length: (cm) 0.4 Width: (cm) 0.3 Depth: (cm) 0.6 Volume: (cm) 0.057 Character of  Wound/Ulcer Post Debridement: Improved Severity of Tissue Post Debridement: Fat layer exposed Post Procedure Diagnosis Same as Pre-procedure Electronic Signature(s) Signed: 12/22/2022 1:46:46 PM By: [name] DO Signed: 12/23/2022 5:48:24 PM By: Deon Pilling RN, BSN Entered By: Deon Pilling on 12/22/2022 13:26:28 -------------------------------------------------------------------------------- HPI Details Patient Name: Date of Service: [name] 12/22/2022 12:45 PM Medical Record Number: [id number] Patient Account Number: [id number] Date of Birth/Sex: Treating RN: 05/26/1951 (73 y.o. M) Primary Care Provider: [name] Other Clinician: Referring Provider: Treating Provider/Extender: [name], [name] in Treatment: 22 History of Present Illness HPI Description: Admission 07/16/2022 Mr. 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 He is not sure how the wounds started. He has been following with podiatry for this issue. He had removal of the tendon to the right lower extremity by Dr. SBlenda Mounts He has been using Betadine to the wound beds. On 06/11/2022 he had a right posterior tibial artery angioplasty by Dr. CCarlis Abbott It was reported the patient is optimized after revascularization of the right lower extremity. His ABIs on the left were 0.91. He currently denies systemic signs of infection. He also does not wear shoes. He came in with Kerlix wrap to his feet bilaterally. This did not completely cover his feet. 07/23/2022: This is a patient of Dr. HJodene Namthat she asked me to take a look at  last week due to the significant involvement of the muscle and tendon on his right posterior leg wound. He needed an aggressive debridement and asked me if I would be able to perform this on her behalf. The patient is here today for that procedure. When his dressing was removed in clinic today, the wound was teeming with maggots. There is necrotic muscle, tendon, and fat, with a thick OSIRAJ, HUTCHINGSA ([id number] Page 4 of 14 layer of slough on the wound. 9/21; patient presents for follow-up. He was debrided by Dr. CCeline Ahrat last clinic visit without any issues. He has been placing Dakin's wet-to-dry dressings to the right posterior wound. He has been using Medihoney to the left foot wound. He reports improvement in wound healing. He has no issues or complaints today. 9/28; patient with type 2 diabetes PAD  status post revascularization. He underwent retrograde right PTA angioplasty. Last saw Dr. Carlis Abbott on 9/12 and was felt to have a brisk posterior tibial signal at the right ankle. He had a pulsatile toe tracing that appeared adequate for healing. He has been using Santyl and Hydrofera Blue on the right Achilles area. He has a smaller area on the left fifth plantar metatarsal head They were apparently in the ER on 9/23 with swelling and discoloration above the wrap. They have a picture of the leg after the wrap was taken off which looks like that it was excessively tight superiorly 10/6; patient presents for follow-up. We have been using Santyl and Hydrofera Blue to the right lower extremity wound under Kerlix/Coban. T the left foot we o have been using silver alginate with Medihoney. He again comes in with no shoes. He has no issues or complaints today. 10/12; patient presents for follow-up. We have been using Santyl and Hydrofera Blue to the right lower extremity under Kerlix/Coban And silver alginate to the left foot wound. He has no issues today. He  denies signs of infection. 10/19; patient presents for follow-up. We continue to use Santyl and Hydrofera Blue to the right lower extremity under Kerlix/Coban and silver alginate to the left foot wound. There is been improvement in wound healing. 10/26; patient presents for follow-up. We have been using Santyl and Hydrofera Blue to the right lower extremity under Kerlix/Coban and silver alginate to the left foot. There continues to be improvement in wound healing. Patient has no issues or complaints today. 11/2; the patient's area on the right Achilles heel looks quite healthy and is improved in terms of measurements. We have been using Hydrofera Blue. He is approved for Grafix On the left plantar foot wound over the fifth metatarsal head this tunnels over the lateral part of the met head. He is not offloading this at all 11/16; patient presents for follow-up. He has been using a surgical shoe with an offloading felt pad to the left lateral foot along with silver alginate. He has been approved for Grafix and this is available for placement today T the right lower extremity wound. Patient Is agreeable to this. We have been using Santyl and o Hydrofera Blue under Kerlix/Coban to this area. 11/21; patient presents for follow-up. We have been doing Grafix to the right lower extremity and silver alginate to the left foot wound. He has no issues or complaints today. 11/30; patient presents for follow-up. We have been doing Grafix to the right lower extremity wound and Medihoney with Hydrofera Blue to the left lateral foot wound. He reports pain to the left foot wound today. He did not obtain his x-ray. 12/11; patient presents for follow-up. He has been using Dakin's wet-to-dry dressings to the left plantar foot wound. We have been doing Grafix to the right lower extremity wound under compression therapy. He obtained his x-ray that showed mild periosteal elevation at the resection site with the possibility  of osteomyelitis. He currently denies systemic signs of infection. He has been taking doxycycline and Augmentin without issues. He is getting his INR checked by his primary care office. 12/18; patient presents for follow-up. He has been using Dakin's wet-to-dry dressings to the left foot wound. We have been placing Grafix under compression therapy to the right lower extremity. He is currently taking doxycycline and Augmentin. He denies signs of infection. 12/26; patient presents for follow-up. He has been using Dakin's wet-to-dry dressings to the left foot. He has been off  oral antibiotics for potential bone biopsy. We have been placing Grafix under compression therapy to the right lower extremity. There has been improvement in wound healing to both sites. He states he is trying to aggressively offload the left foot. He currently denies signs of infection. 1/2; patient presents for follow-up. He has been doing Dakin's wet-to-dry dressings to the left foot. He saw Dr. Blenda Mounts today and had a bone biopsy. Debridement was performed on the site. Dressing in place with Dakin's wet-to-dry. T the right leg we have been using Grafix under compression therapy. He o has no issues or complaints today. 1/9; patient presents for follow-up. We have been using Hydrofera Blue under compression therapy to the right lower extremity wound. This is well-healing. He has been using Dakin's wet-to-dry dressings to the left lateral foot wound. He has been taking ciprofloxacin due to culture results without issues. He was referred to infectious disease by podiatry. He does not have an appointment yet. 1/16; patient presents for follow-up. We were using collagen under Kerlix/Coban to the right lower extremity wound. He has been approved for Grafix and this was available for placement today. He continues to use Dakin's wet-to-dry dressings daily to the left foot. He started levofloxacin. He has an appointment with infectious  disease on 1/24. 1/23; patient presents for follow-up. Grafix was placed in standard fashion to the right lower extremity wound last week. He has been using Dakin's wet-to-dry dressings to the left foot. He has been taking levofloxacin. He has an infectious disease appointment tomorrow as well as a pulmonology appointment. He followed up with vein and vascular on 1/16 and plan is for angiogram of the left lower extremity. He has a history of left peroneal angioplasty in 2022. 1/30; patient presents for follow-up. Grafix was placed in standard fashion to the right lower extremity wound last week. There has been improvement in healing here. He has been using Dakin's wet-to-dry dressings to the left foot. Patient saw infectious disease, Dr. Juleen China on 1/24 and plan is for PICC line on 2/5 with cefepime. For now he is taking levofloxacin. He has been cleared by pulmonology for HBO. Due to extensive heart history we have asked HBO clearance from his cardiologist as well. He has follow-up with them tomorrow. He currently has no issues or complaints today. 2/6; patient presents for follow-up. He started HBO therapy today and had no issues with his dive. He had an arteriogram on 2/1 by Dr. Carlis Abbott. He had angioplasty of the left peroneal artery. He is optimized from a vascular standpoint. We have been using Grafix to the right lower extremity wound and Dakin's wet-to-dry dressings to the left foot wound. He started his IV antibiotics yesterday. 2/13; patient presents for follow-up. We have been using Grafix to the right lower extremity wound under compression therapy and Dakin's wet-to-dry dressings to the left lateral wound. He is continued IV antibiotics without issues. Electronic Signature(s) Signed: 12/22/2022 1:46:46 PM By: [name] DO Entered By: [name] on 12/22/2022 13:40:32 Cordie Grice ([id number] Page 5 of  14 -------------------------------------------------------------------------------- Physical Exam Details Patient Name: Date of Service: [name] [name] [facility] A. 12/22/2022 12:45 PM Medical Record Number: [id number] Patient Account Number: [id number] Date of Birth/Sex: Treating RN: Jun 29, 1951 (73 y.o. M) Primary Care Provider: [name] Other Clinician: Referring Provider: Treating Provider/Extender: [name] Ngetich, [name] in Treatment: 22 Constitutional respirations regular, non-labored and within target range for patient.. Cardiovascular 2+ dorsalis pedis/posterior tibialis pulses. Psychiatric pleasant and cooperative. Notes  Right lower extremity: T the posterior aspect there is an open wound with granulation tissue and nonviable tissue. Venous stasis dermatitis. T the left lower o o extremity there is An open wound With granulation tissue and nonviable tissue. Does not obviously probe to bone anymore. No surrounding signs of soft tissue infection. Electronic Signature(s) Signed: 12/22/2022 1:46:46 PM By: [name] DO Entered By: [name] on 12/22/2022 13:41:19 -------------------------------------------------------------------------------- Physician Orders Details Patient Name: Date of Service: [name] [place] A. 12/22/2022 12:45 PM Medical Record Number: [id number] Patient Account Number: [id number] Date of Birth/Sex: Treating RN: [birth year] (72 y.o. 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 - Dr. Heber [place] Tuesday 0930 12/29/2022 keep appt time. after hyperbarics will add you into clinic. Return A ppointment in 2 weeks. - Dr. Heber [place] Tuesday 1015 01/05/2023 keep appt time. after hyperbarics will add you into clinic. Return A Other: - NO LOTION due to hyperbarics. Anesthetic (In clinic) Topical Lidocaine 5% applied to wound bed Cellular or Tissue Based Products Wound #1 Right Achilles Cellular or Tissue Based Product Type: - Grafix approved-waiting to hear about copay or coinsurance-cost for you out of your pocket. BENZION, EXLEY A ([id number]) [id number] Page 6 of 14 Kerecsis denied by insurance. Grafix #1 applied on 09/24/22 Grafix #2 applied on 09/29/2022 Grafix # 3 applied on 10/07/22 Grafix # 4 applied on 10/19/22 Grafix # 5 applied 10/26/2022 Grafix applied to right achilles wound 11/03/2022 Grafix pending insurance approval 11/24/2022 Grafix approved from now to 02/14/2023 total 12. #7 applied. Grafix # 8 applied 12/01/22 Grafix # 9 applied 12/08/22 Grafix #10 applied to 12/15/2022 Grafix #11 applied to 12/22/2022 Cellular or Tissue Based Product applied to wound bed, secured with steri-strips, cover with Adaptic or Mepitel. (DO NOT REMOVE). Edema Control - Lymphedema / SCD / Other Avoid standing for long periods of time. Off-Loading Open toe surgical shoe to: - ****put PegAssist in surgical shoe.**** Prevalon Boot - left foot while resting in bed at night. Other: - minimize walking and standing to aid in offloading pressure to wound. Hyperbaric Oxygen Therapy Wound #2 Left,Plantar Foot Evaluate for HBO Therapy Indication: - Wagner grade 3 If appropriate for treatment, begin HBOT per protocol: 2.0 ATA for 90 Minutes  with 2 Five (5) Minute A Breaks ir Total Number of Treatments: - 40 One treatments per day (delivered Monday through Friday unless otherwise specified in Special Instructions below): Finger stick Blood Glucose Pre- and Post- HBOT Treatment. Follow Hyperbaric Oxygen Glycemia Protocol A frin (Oxymetazoline HCL) 0.05% nasal spray - 1 spray in both nostrils daily as needed prior to HBO treatment for difficulty clearing ears Wound Treatment Wound #1 - Achilles Wound Laterality: Right Cleanser: Soap and Water 1 x Per Week/30 Days Discharge Instructions: May shower and wash wound with dial antibacterial soap and water prior to dressing change. Cleanser: Wound Cleanser 1 x Per Week/30 Days Discharge Instructions: Cleanse the wound with wound cleanser prior to applying a  clean dressing using gauze sponges, not tissue or cotton balls. Prim Dressing: Grafix 11 1 x Per Week/30 Days ary Discharge Instructions: applied by provider. Prim Dressing: adpatic and steri-strips 1 x Per Week/30 Days ary Discharge Instructions: to secure grafix in place. do not remove. Secondary Dressing: ABD Pad, 5x9 1 x Per Week/30 Days Discharge Instructions: Apply over primary dressing as directed. Secondary Dressing: Woven Gauze Sponge, Non-Sterile 4x4 in 1 x Per Week/30 Days Discharge Instructions: Apply over primary dressing as directed. Compression Wrap: Kerlix Roll 4.5x3.1 (in/yd) 1 x Per Week/30 Days Discharge Instructions: Apply Kerlix and Coban compression as directed. Compression Wrap: Coban Self-Adherent Wrap 4x5 (in/yd) 1 x Per Week/30 Days Discharge Instructions: Apply over Kerlix as directed. Wound #2 - Foot Wound Laterality: Plantar, Left Cleanser: Wound Cleanser 1 x Per Day/30 Days Discharge Instructions: Cleanse the wound with wound cleanser prior to applying a clean dressing using gauze sponges, not tissue or cotton balls. Peri-Wound Care: Skin Prep (Generic) 1 x Per Day/30 Days Discharge Instructions:  Use skin prep as directed Prim Dressing: Dakin's Solution 0.25%, 16 (oz) 1 x Per Day/30 Days ary Discharge Instructions: Moisten gauze with Dakin's solution Secondary Dressing: Optifoam Non-Adhesive Dressing, 4x4 in (Generic) 1 x Per Day/30 Days Discharge Instructions: Apply over primary dressing as directed. Secondary Dressing: Woven Gauze Sponge, Non-Sterile 4x4 in (Generic) 1 x Per Day/30 Days Discharge Instructions: Apply over primary dressing as directed. Secondary Dressing: Woven Gauze Sponges 2x2 in (Generic) 1 x Per Day/30 Days Discharge Instructions: Apply over primary dressing as directed. LEALON, SKILLINGS A ([id number]) [id number] Page 7 of 14 Secured With: Child psychotherapist, Sterile 2x75 (in/in) (Generic) 1 x Per Day/30 Days Discharge Instructions: Secure with stretch gauze as directed. Secured With: 87M Medipore H Soft Cloth Surgical T ape, 4 x 10 (in/yd) (Generic) 1 x Per Day/30 Days Discharge Instructions: Secure with tape as directed. GLYCEMIA INTERVENTIONS PROTOCOL PRE-HBO GLYCEMIA INTERVENTIONS ACTION INTERVENTION Obtain pre-HBO capillary blood glucose (ensure 1 physician order is in chart). A. Notify HBO physician and await physician orders. 2 If result is 70 mg/dl or below: B. If the result meets the hospital definition of a critical result, follow hospital policy. A. Give patient an 8 ounce Glucerna Shake, an 8 ounce Ensure, or 8 ounces of a Glucerna/Ensure equivalent dietary supplement*. B. Wait 30 minutes. If result is 71 mg/dl to 130 mg/dl: C. Retest patients capillary blood glucose (CBG). D. If result greater than or equal to 110 mg/dl, proceed with HBO. If result less than 110 mg/dl, notify HBO physician and consider holding HBO. If result is 131 mg/dl to 249 mg/dl: A. Proceed with HBO. A. Notify HBO physician and await physician orders. B. It is recommended to hold HBO and do If result is 250 mg/dl or  greater: blood/urine ketone testing. C. If the result meets the hospital definition of a critical result, follow hospital policy. POST-HBO GLYCEMIA INTERVENTIONS ACTION INTERVENTION Obtain post HBO capillary blood glucose (ensure 1 physician order is in chart). A. Notify HBO physician and await physician orders. 2 If result is 70 mg/dl or below: B. If the result meets the hospital definition of a critical result, follow hospital policy. A. Give patient an 8 ounce Glucerna Shake, an 8 ounce Ensure, or 8 ounces of a Glucerna/Ensure equivalent dietary supplement*. B. Wait 15 minutes for symptoms of If result is 71 mg/dl to 100 mg/dl: hypoglycemia (i.e. nervousness, anxiety, sweating, chills, clamminess, irritability, confusion, tachycardia or dizziness). C. If patient asymptomatic, discharge patient. If patient  symptomatic, repeat capillary blood glucose (CBG) and notify HBO physician. If result is 101 mg/dl to 249 mg/dl: A. Discharge patient. A. Notify HBO physician and await physician orders. B. It is recommended to do blood/urine ketone If result is 250 mg/dl or greater: testing. C. If the result meets the hospital definition of a critical result, follow hospital policy. *Juice or candies are NOT equivalent products. If patient refuses the Glucerna or Ensure, please consult the hospital dietitian for an appropriate substitute. Electronic Signature(s) Signed: 12/22/2022 1:46:46 PM By: [name] DO Entered By: [name] on 12/22/2022 13:41:29 -------------------------------------------------------------------------------- Problem List Details Patient Name: Date of Service: [name] [facility] A. 12/22/2022 12:45 PM Medical Record Number: [id number] Patient Account Number: [id number] Date of Birth/Sex: Treating RN: 12/28/1950 (72 y.o. [name] Primary Care Provider: [name] Other Clinician: Referring Provider: Treating Provider/Extender:  Ryotaro, Romans A ([id number]) [id number] Page 8 of 14 Weeks in Treatment: 22 Active Problems ICD-10 Encounter Code Description Active Date MDM Diagnosis L97.518 Non-pressure chronic ulcer of other part of right foot with other specified 07/16/2022 No Yes severity L97.522 Non-pressure chronic ulcer of other part of left foot with fat layer exposed 07/16/2022 No Yes E11.621 Type 2 diabetes mellitus with foot ulcer 07/16/2022 No Yes I73.9 Peripheral vascular disease, unspecified 07/16/2022 No Yes 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 07/23/2022 No Yes muscle M86.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, left ankle and foot 11/10/2022 No Yes Inactive Problems Resolved Problems Electronic Signature(s) Signed: 12/22/2022 1:46:46 PM By: [name] DO Entered By: [name] on 12/22/2022 13:39:35 -------------------------------------------------------------------------------- Progress Note Details Patient Name: Date of Service: [name] 12/22/2022 12:45 PM Medical Record Number: [id number] Patient Account Number: [id number] Date of Birth/Sex: Treating RN: 12-01-1950 (73 y.o. M) Primary Care Provider: [name] Other Clinician: Referring Provider: Treating Provider/Extender: [name], [name] in Treatment: 22 Subjective Chief Complaint Information obtained from Patient 07/16/2022; bilateral lower extremity wounds History of Present Illness (HPI) Admission 07/16/2022 Mr. Rondale Wassenberg is a 72 year old male with a past medical history of insulin-dependent currently controlled type 2 diabetes complicated by peripheral neuropathy, chronic systolic heart failure, obstructive sleep apnea, and peripheral vascular disease that presents to the clinic for a several month history of nonhealing ulcer to the back of the right leg and a 50-monthhistory of  nonhealing wounds to the left foot. He is not sure how the wounds started. He has been following with podiatry for this issue. He had removal of the tendon to the right lower extremity by Dr. SBlenda Mounts He has been using Betadine to the wound beds. On 06/11/2022 he had a right posterior tibial artery angioplasty by Dr. CCarlis Abbott It was reported the patient is optimized after revascularization of the right lower extremity. His ABIs on the left were 0.91. He currently denies systemic signs of infection. He also does not wear shoes. He came in with Kerlix wrap to his feet bilaterally. This did not completely cover his feet. ORAYMONE, RENZIA ([id number] Page 9 of 14 07/23/2022: This is a patient of Dr. HJodene Namthat she asked me to take a look at last week due to the significant involvement of the muscle and tendon on his right posterior leg wound. He needed an aggressive debridement and asked me if I would be able to perform this on her behalf. The patient is here today for that procedure. When his dressing was removed in clinic  today, the wound was teeming with maggots. There is necrotic muscle, tendon, and fat, with a thick layer of slough on the wound. 9/21; patient presents for follow-up. He was debrided by Dr. Celine Ahr at last clinic visit without any issues. He has been placing Dakin's wet-to-dry dressings to the right posterior wound. He has been using Medihoney to the left foot wound. He reports improvement in wound healing. He has no issues or complaints today. 9/28; patient with type 2 diabetes PAD status post revascularization. He underwent retrograde right PTA angioplasty. Last saw Dr. Carlis Abbott on 9/12 and was felt to have a brisk posterior tibial signal at the right ankle. He had a pulsatile toe tracing that appeared adequate for healing. He has been using Santyl and Hydrofera Blue on the right Achilles area. oo He has a smaller area on the left fifth plantar  metatarsal head They were apparently in the ER on 9/23 with swelling and discoloration above the wrap. They have a picture of the leg after the wrap was taken off which looks like that it was excessively tight superiorly 10/6; patient presents for follow-up. We have been using Santyl and Hydrofera Blue to the right lower extremity wound under Kerlix/Coban. T the left foot we o have been using silver alginate with Medihoney. He again comes in with no shoes. He has no issues or complaints today. 10/12; patient presents for follow-up. We have been using Santyl and Hydrofera Blue to the right lower extremity under Kerlix/Coban And silver alginate to the left foot wound. He has no issues today. He denies signs of infection. 10/19; patient presents for follow-up. We continue to use Santyl and Hydrofera Blue to the right lower extremity under Kerlix/Coban and silver alginate to the left foot wound. There is been improvement in wound healing. 10/26; patient presents for follow-up. We have been using Santyl and Hydrofera Blue to the right lower extremity under Kerlix/Coban and silver alginate to the left foot. There continues to be improvement in wound healing. Patient has no issues or complaints today. 11/2; the patient's area on the right Achilles heel looks quite healthy and is improved in terms of measurements. We have been using Hydrofera Blue. He is approved for Grafix On the left plantar foot wound over the fifth metatarsal head this tunnels over the lateral part of the met head. He is not offloading this at all 11/16; patient presents for follow-up. He has been using a surgical shoe with an offloading felt pad to the left lateral foot along with silver alginate. He has been approved for Grafix and this is available for placement today T the right lower extremity wound. Patient Is agreeable to this. We have been using Santyl and o Hydrofera Blue under Kerlix/Coban to this area. 11/21; patient  presents for follow-up. We have been doing Grafix to the right lower extremity and silver alginate to the left foot wound. He has no issues or complaints today. 11/30; patient presents for follow-up. We have been doing Grafix to the right lower extremity wound and Medihoney with Hydrofera Blue to the left lateral foot wound. He reports pain to the left foot wound today. He did not obtain his x-ray. 12/11; patient presents for follow-up. He has been using Dakin's wet-to-dry dressings to the left plantar foot wound. We have been doing Grafix to the right lower extremity wound under compression therapy. He obtained his x-ray that showed mild periosteal elevation at the resection site with the possibility of osteomyelitis. He currently denies systemic signs  of infection. He has been taking doxycycline and Augmentin without issues. He is getting his INR checked by his primary care office. 12/18; patient presents for follow-up. He has been using Dakin's wet-to-dry dressings to the left foot wound. We have been placing Grafix under compression therapy to the right lower extremity. He is currently taking doxycycline and Augmentin. He denies signs of infection. 12/26; patient presents for follow-up. He has been using Dakin's wet-to-dry dressings to the left foot. He has been off oral antibiotics for potential bone biopsy. We have been placing Grafix under compression therapy to the right lower extremity. There has been improvement in wound healing to both sites. He states he is trying to aggressively offload the left foot. He currently denies signs of infection. 1/2; patient presents for follow-up. He has been doing Dakin's wet-to-dry dressings to the left foot. He saw Dr. Blenda Mounts today and had a bone biopsy. Debridement was performed on the site. Dressing in place with Dakin's wet-to-dry. T the right leg we have been using Grafix under compression therapy. He o has no issues or complaints today. 1/9; patient  presents for follow-up. We have been using Hydrofera Blue under compression therapy to the right lower extremity wound. This is well-healing. He has been using Dakin's wet-to-dry dressings to the left lateral foot wound. He has been taking ciprofloxacin due to culture results without issues. He was referred to infectious disease by podiatry. He does not have an appointment yet. 1/16; patient presents for follow-up. We were using collagen under Kerlix/Coban to the right lower extremity wound. He has been approved for Grafix and this was available for placement today. He continues to use Dakin's wet-to-dry dressings daily to the left foot. He started levofloxacin. He has an appointment with infectious disease on 1/24. 1/23; patient presents for follow-up. Grafix was placed in standard fashion to the right lower extremity wound last week. He has been using Dakin's wet-to-dry dressings to the left foot. He has been taking levofloxacin. He has an infectious disease appointment tomorrow as well as a pulmonology appointment. He followed up with vein and vascular on 1/16 and plan is for angiogram of the left lower extremity. He has a history of left peroneal angioplasty in 2022. 1/30; patient presents for follow-up. Grafix was placed in standard fashion to the right lower extremity wound last week. There has been improvement in healing here. He has been using Dakin's wet-to-dry dressings to the left foot. Patient saw infectious disease, Dr. Juleen China on 1/24 and plan is for PICC line on 2/5 with cefepime. For now he is taking levofloxacin. He has been cleared by pulmonology for HBO. Due to extensive heart history we have asked HBO clearance from his cardiologist as well. He has follow-up with them tomorrow. He currently has no issues or complaints today. 2/6; patient presents for follow-up. He started HBO therapy today and had no issues with his dive. He had an arteriogram on 2/1 by Dr. Carlis Abbott. He had angioplasty  of the left peroneal artery. He is optimized from a vascular standpoint. We have been using Grafix to the right lower extremity wound and Dakin's wet-to-dry dressings to the left foot wound. He started his IV antibiotics yesterday. 2/13; patient presents for follow-up. We have been using Grafix to the right lower extremity wound under compression therapy and Dakin's wet-to-dry dressings to the left lateral wound. He is continued IV antibiotics without issues. Patient History Information obtained from Chart. Family History Diabetes - Mother, Heart Disease - Mother,Siblings. Social History  JADEVEON, BOULET A ([id number]) [id number] Page 10 of 14 Current every day smoker, Alcohol Use - Never, Drug Use - No History, Caffeine Use - Never. Medical History Respiratory Patient has history of Sleep Apnea - does not wear Cardiovascular Patient has history of Arrhythmia - A. Fib, Congestive Heart Failure - EF 25%, Hypertension, Peripheral Arterial Disease Denies history of Peripheral Venous Disease Endocrine Patient has history of Type II Diabetes Hospitalization/Surgery History - Angiogram 06/11/2022 Dr. Carlis Abbott [facility]. - cardioversion 11/06/2021, 2015, 2017. 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 Medical A Surgical History Notes nd Gastrointestinal Ascites Genitourinary stage III Kidney disease Objective Constitutional respirations regular, non-labored and within target range for patient.. Vitals Time Taken: 12:32 PM, Height: 68 in, Weight: 185 lbs, BMI: 28.1, Temperature: 97.6 F, Pulse: 63 bpm, Respiratory Rate: 18 breaths/min, Blood Pressure: 137/71 mmHg, Capillary Blood Glucose: 133 mg/dl. General Notes: see post HBO VS. Cardiovascular 2+ dorsalis pedis/posterior tibialis pulses. Psychiatric pleasant and cooperative. General Notes: Right lower extremity: T the posterior aspect there is an open wound with granulation tissue and nonviable tissue. Venous stasis  dermatitis. T o o the left lower extremity there is An open wound With granulation tissue and nonviable tissue. Does not obviously probe to bone anymore. No surrounding signs of soft tissue infection. Integumentary (Hair, Skin) Wound #1 status is Open. Original cause of wound was Surgical Injury. The date acquired was: 07/16/2022. The wound has been in treatment 22 weeks. The wound is located on the Right Achilles. The wound measures 1cm length x 0.4cm width x 0.1cm depth; 0.314cm^2 area and 0.031cm^3 volume. There is Fat Layer (Subcutaneous Tissue) exposed. There is no tunneling or undermining noted. There is a none present amount of drainage noted. The wound margin is distinct with the outline attached to the wound base. There is no granulation within the wound bed. There is a large (67-100%) amount of necrotic tissue within the wound bed including Eschar. The periwound skin appearance exhibited: Dry/Scaly. The periwound skin appearance did not exhibit: Callus, Crepitus, Excoriation, Induration, Rash, Scarring, Maceration, Atrophie Blanche, Cyanosis, Ecchymosis, Hemosiderin Staining, Mottled, Pallor, Rubor, Erythema. Wound #2 status is Open. Original cause of wound was Gradually Appeared. The date acquired was: 03/09/2022. The wound has been in treatment 22 weeks. The wound is located on the [place]. The wound measures 0.4cm length x 0.3cm width x 0.6cm depth; 0.094cm^2 area and 0.057cm^3 volume. There is bone and Fat Layer (Subcutaneous Tissue) exposed. There is no undermining noted, however, there is tunneling at 3:00 with a maximum distance of 0.9cm. There is a medium amount of serosanguineous drainage noted. The wound margin is distinct with the outline attached to the wound base. There is large (67- 100%) pink, friable granulation within the wound bed. There is a small (1-33%) amount of necrotic tissue within the wound bed including Adherent Slough. The periwound skin appearance  exhibited: Callus. The periwound skin appearance did not exhibit: Crepitus, Excoriation, Induration, Rash, Scarring, Dry/Scaly, Maceration, Atrophie Blanche, Cyanosis, Ecchymosis, Hemosiderin Staining, Mottled, Pallor, Rubor, Erythema. 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 I debrided nonviable tissue and Grafix was placed in standard fashion. Continue Kerlix/Coban here. The  left foot wound does not probe to bone anymore. I debrided nonviable tissue here and recommended continuing Dakin's wet-to-dry dressings. Continue aggressive offloading with surgical shoe and peg assist. Continue HBO therapy. Follow-up in 1 week. WESTAN, DRESEN A ([id number]) [id number] Page 11 of 14 Procedures Wound #1 Pre-procedure diagnosis of Wound #1 is a Diabetic Wound/Ulcer of the Lower Extremity located on the Right Achilles .Severity of Tissue Pre Debridement is: Fat layer exposed. There was a Selective/Open Wound Skin/Epidermis Debridement with a total area of 9 sq cm performed by [name], DO. With the following instrument(s): Curette to remove Non-Viable tissue/material. Material removed includes Callus, Skin: Dermis, and Skin: Epidermis after achieving pain control using Lidocaine 4% T opical Solution. A time out was conducted at 13:10, prior to the start of the procedure. A Minimum amount of bleeding was controlled with Pressure. The procedure was tolerated well with a pain level of 0 throughout and a pain level of 0 following the procedure. Post  Debridement Measurements: 1cm length x 0.3cm width x 0.1cm depth; 0.024cm^3 volume. Character of Wound/Ulcer Post Debridement is improved. Severity of Tissue Post Debridement is: Fat layer exposed. Post procedure Diagnosis Wound #1: Same as Pre-Procedure Pre-procedure diagnosis of Wound #1 is a Diabetic Wound/Ulcer of the Lower Extremity located on the Right Achilles. A skin graft procedure using a bioengineered skin substitute/cellular or tissue based product was performed by [name], DO with the following instrument(s): Forceps and Scissors. Grafix prime was applied and secured with Steri-Strips and adaptic. 2 sq cm of product was utilized and 0 sq cm was wasted. Post Application, no dressing was applied. A Time Out was conducted at 13:20, prior to the start of the procedure. The procedure was tolerated well with a pain level of 0 throughout and a pain level of 0 following the procedure. Post procedure Diagnosis Wound #1: Same as Pre-Procedure . Wound #2 Pre-procedure diagnosis of Wound #2 is a Diabetic Wound/Ulcer of the Lower Extremity located on the [place] .Severity of Tissue Pre Debridement is: Fat layer exposed. There was a Excisional Skin/Subcutaneous Tissue Debridement with a total area of 0.25 sq cm performed by [name], DO. With the following instrument(s): Curette to remove Viable and Non-Viable tissue/material. Material removed includes Callus, Subcutaneous Tissue, Slough, Skin: Dermis, and Skin: Epidermis after achieving pain control using Lidocaine 4% Topical Solution. A time out was conducted at 13:10, prior to the start of the procedure. A Minimum amount of bleeding was controlled with Pressure. The procedure was tolerated well with a pain level of 0 throughout and a pain level of 0 following the procedure. Post Debridement Measurements: 0.4cm length x 0.3cm width x 0.6cm depth; 0.057cm^3 volume. Character of Wound/Ulcer Post Debridement is improved.  Severity of Tissue Post Debridement is: Fat layer exposed. Post procedure Diagnosis Wound #2: Same as Pre-Procedure Plan Follow-up Appointments: Return Appointment in 1 week. - Dr. Heber [place] Tuesday 0930 12/29/2022 keep appt time. after hyperbarics will add you into clinic. Return Appointment in 2 weeks. - Dr. Heber [place] Tuesday 1015 01/05/2023 keep appt time. after hyperbarics will add you into clinic. Other: - NO LOTION due to hyperbarics. Anesthetic: (In clinic) Topical Lidocaine 5% applied to wound bed Cellular or Tissue Based Products: Wound #1 Right Achilles: Cellular or Tissue Based Product Type: - Grafix approved-waiting to hear about copay or coinsurance-cost for you out of your pocket. Kerecsis denied by insurance. Grafix #1 applied on 09/24/22 Grafix #2 applied on 09/29/2022 Grafix # 3 applied on 10/07/22 Grafix # 4  applied on 10/19/22 Grafix # 5 applied 10/26/2022 Grafix applied to right achilles wound 11/03/2022 Grafix pending insurance approval 11/24/2022 Grafix approved from now to 02/14/2023 total 12. #7 applied. Grafix # 8 applied 12/01/22 Grafix # 9 applied 12/08/22 Grafix #10 applied to 12/15/2022 Grafix #11 applied to 12/22/2022 Cellular or Tissue Based Product applied to wound bed, secured with steri-strips, cover with Adaptic or Mepitel. (DO NOT REMOVE). Edema Control - Lymphedema / SCD / Other: Avoid standing for long periods of time. Off-Loading: Open toe surgical shoe to: - ****put PegAssist in surgical shoe.**** Prevalon Boot - left foot while resting in bed at night. Other: - minimize walking and standing to aid in offloading pressure to wound. Hyperbaric Oxygen Therapy: Wound #2 Left,Plantar Foot: Evaluate for HBO Therapy Indication: - Wagner grade 3 If appropriate for treatment, begin HBOT per protocol: 2.0 ATA for 90 Minutes with 2 Five (5) Minute Air Breaks T Number of Treatments: - 40 otal One treatments per day (delivered Monday through Friday unless otherwise  specified in Special Instructions below): Finger stick Blood Glucose Pre- and Post- HBOT Treatment. Follow Hyperbaric Oxygen Glycemia Protocol Afrin (Oxymetazoline HCL) 0.05% nasal spray - 1 spray in both nostrils daily as needed prior to HBO treatment for difficulty clearing ears WOUND #1: - Achilles Wound Laterality: Right Cleanser: Soap and Water 1 x Per Week/30 Days Discharge Instructions: May shower and wash wound with dial antibacterial soap and water prior to dressing change. Cleanser: Wound Cleanser 1 x Per Week/30 Days Discharge Instructions: Cleanse the wound with wound cleanser prior to applying a clean dressing using gauze sponges, not tissue or cotton balls. Prim Dressing: Grafix 11 1 x Per Week/30 Days ary Discharge Instructions: applied by provider. Prim Dressing: adpatic and steri-strips 1 x Per Week/30 Days ary Discharge Instructions: to secure grafix in place. do not remove. Secondary Dressing: ABD Pad, 5x9 1 x Per Week/30 Days Discharge Instructions: Apply over primary dressing as directed. Secondary Dressing: Woven Gauze Sponge, Non-Sterile 4x4 in 1 x Per Week/30 Days Discharge Instructions: Apply over primary dressing as directed. Com pression Wrap: Kerlix Roll 4.5x3.1 (in/yd) 1 x Per Week/30 Days Discharge Instructions: Apply Kerlix and Coban compression as directed. Com pression Wrap: Coban Self-Adherent Wrap 4x5 (in/yd) 1 x Per Week/30 Days Discharge Instructions: Apply over Kerlix as directed. WOUND #2: - Foot Wound Laterality: Plantar, Left Cleanser: Wound Cleanser 1 x Per Day/30 Days Discharge Instructions: Cleanse the wound with wound cleanser prior to applying a clean dressing using gauze sponges, not tissue or cotton balls. Peri-Wound Care: Skin Prep (Generic) 1 x Per Day/30 Days VERDEN, DONINI A ([id number]) [id number] Page 12 of 14 Discharge Instructions: Use skin prep as directed Prim Dressing: Dakin's Solution 0.25%, 16 (oz)  1 x Per Day/30 Days ary Discharge Instructions: Moisten gauze with Dakin's solution Secondary Dressing: Optifoam Non-Adhesive Dressing, 4x4 in (Generic) 1 x Per Day/30 Days Discharge Instructions: Apply over primary dressing as directed. Secondary Dressing: Woven Gauze Sponge, Non-Sterile 4x4 in (Generic) 1 x Per Day/30 Days Discharge Instructions: Apply over primary dressing as directed. Secondary Dressing: Woven Gauze Sponges 2x2 in (Generic) 1 x Per Day/30 Days Discharge Instructions: Apply over primary dressing as directed. Secured With: Child psychotherapist, Sterile 2x75 (in/in) (Generic) 1 x Per Day/30 Days Discharge Instructions: Secure with stretch gauze as directed. Secured With: 15M Medipore H Soft Cloth Surgical T ape, 4 x 10 (in/yd) (Generic) 1 x Per Day/30 Days Discharge Instructions: Secure with tape as directed. 1. In office sharp debridement  2. Grafix placed in standard fashion 3. Kerlix/Cobanooright lower extremity 4. Dakin's wet-to-dry dressingsooleft foot 5. Aggressive offloadingoosurgical shoe and peg assist 6. Follow-up in 1 week 7. Continue HBO therapy Electronic Signature(s) Signed: 12/22/2022 1:46:46 PM By: [name] DO Entered By: [name] on 12/22/2022 13:42:42 -------------------------------------------------------------------------------- HxROS Details Patient Name: Date of Service: [name] 12/22/2022 12:45 PM Medical Record Number: [id number] Patient Account Number: [id number] Date of Birth/Sex: Treating RN: Apr 02, 1951 (72 y.o. M) Primary Care Provider: [name] Other Clinician: Referring Provider: Treating Provider/Extender: [name] Ngetich, [name] in Treatment: 22 Information Obtained From Chart Respiratory Medical History: Positive for: Sleep Apnea - does not wear Cardiovascular Medical History: Positive for: Arrhythmia - A. Fib; Congestive Heart Failure - EF 25%; Hypertension;  Peripheral Arterial Disease Negative for: Peripheral Venous Disease Gastrointestinal Medical History: Past Medical History Notes: Ascites Endocrine Medical History: Positive for: Type II Diabetes Time with diabetes: 6 years Treated with: Insulin Blood sugar tested every day: No Genitourinary Medical History: Past Medical History Notes: stage III Kidney disease NELTON, NIP A ([id number]) [id number] Page 13 of 14 Immunizations Pneumococcal Vaccine: Received Pneumococcal Vaccination: No Implantable Devices No devices added Hospitalization / Surgery History Type of Hospitalization/Surgery Angiogram 06/11/2022 Dr. 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 12/22/2022 Medical Record Number: [id number] Patient Account Number: [id number] Date of Birth/Sex: Treating RN: 17-May-1951 (72 y.o. 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, left ankle and foot Facility Procedures : CPT4 Code: [id number] Description: [id number]- Grafix PL 16 mm disc (2 units) Modifier: Quantity: 2 : CPT4 Code: [id number] Description: [id number] - SKIN SUB GRAFT TRNK/ARM/LEG ICD-10 Diagnosis Description 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 mu Modifier: scle Quantity: 1 : CPT4 Code: [id number] Description: [id number] - DEB SUBQ TISSUE 20 SQ CM/< ICD-10 Diagnosis Description L97.522 Non-pressure chronic ulcer of other part of left foot with fat layer exposed E11.621 Type 2 diabetes mellitus with foot ulcer Modifier: Quantity: 1 Physician Procedures : CPT4 Code Description Modifier [id number] - WC PHYS SKIN SUB GRAFT TRNK/ARM/LEG ICD-10 Diagnosis Description 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 [id number] - WC PHYS SUBQ TISS 20 SQ CM 1 ICD-10 Diagnosis Description L97.522 Non-pressure chronic ulcer of other part of left foot with fat layer exposed E11.621 Type 2 diabetes mellitus with foot ulcer Quantity: 1 pdf Page 14 of 14 Electronic Signature(s) Signed: 12/22/2022 1:46:46 PM By: [name] DO Entered By: [name] on 12/22/2022 13:43:07

## 2022-12-25 NOTE — Progress Notes (Signed)
[name], HORMAN [name] ([id number]) [id number] Page 1 of 2 Visit Report for 12/24/2022 HBO Details Patient Name: Date of Service: [name] [name] [facility] [name]. 12/24/2022 10:00 [name]: [id number] Patient Account Number: [id number] Date of Birth/Sex: Treating RN: 1950/12/29 (72 y.Darren. [name] Primary Care [name]: [name] Other Clinician: Donavan [name] Referring [name]: Treating [name]/Extender: [name] in Treatment: 23 HBO Treatment Course Details Treatment Course Number: 1 Ordering [name]: [name] T Treatments Ordered: otal 40 HBO Treatment Start Date: 12/15/2022 HBO Indication: Diabetic Ulcer(s) of the Lower Extremity Standard/Conservative Wound Care tried and failed greater than or equal to 30 days Wound #2 Left, Plantar Foot HBO Treatment Details Treatment Number: 7 Patient Type: Outpatient Chamber Type: Monoplace Chamber Serial #: [id number] Treatment Protocol: 2.0 ATA with 90 minutes oxygen, with two 5 minute air breaks Treatment Details Compression Rate Down: 1.0 psi / minute De-Compression Rate Up: 2.0 psi / minute [name] breaks and breathing ir Compress Tx Pressure periods Decompress Decompress Begins Reached (leave unused spaces Begins Ends blank) Chamber Pressure (ATA 1 2 2 2 2 2 $ --2 1 ) Clock Time (24 hr) 11:00 11:11 11:41 11:46 12:16 12:21 - - 12:51 13:08 Treatment Length: 128 (minutes) Treatment Segments: 4 Vital Signs Capillary Blood Glucose Reference Range: 80 - 120 mg / dl HBO Diabetic Blood Glucose Intervention Range: <131 mg/dl or >249 mg/dl Type: Time Vitals Blood Pulse: Respiratory Temperature: Capillary Blood Glucose Pulse Action Taken: Pressure: Rate: Glucose (mg/dl): Meter #: Oximetry (%) Taken: Pre 10:40 155/100 121 18 98.4 182 1 informed physician HR and BP Post 13:08 144/89 98 18 97.3 139 1 none per protocol Treatment Response Treatment Toleration:  Well Treatment Completion Status: Treatment Completed without Adverse Event Treatment Notes Patient arrived, blood glucose measured at 182. Patient states that he consumed 2 pack hot cocoa with halfandhalf, 12 grain bread, ham, processed white cheese and some baclava cake. Patient prepared for treatment. BP and other vitals taken after prepping. Heart rate was measured at 121 bpm and BP was 155/100 mmHg. Blood pressure was taken again shortly thereafter with manual BP of 160/90 and heart rate 93 irregular. Dr. Heber [name] was notified of this information. Patient stated that he felt fine and explained that his medication was taken short time before. After performing safety check, patient was placed in the chamber and chamber was compressed at 1 psi/min until reaching 8 psig at which time patient confirmed normal ear equalization and rate set was increased to 1.5 psi/min. Patient tolerated treatment and decompression of the chamber at 1.5 psi/min. Post treatment vital signs were within normal parameters and patient was stable upon discharge. Additional Procedure Documentation Tissue Sevierity: Necrosis of bone Physician HBO Attestation: I certify that I supervised this HBO treatment in accordance with Medicare guidelines. [name] trained emergency response team is readily available per Yes hospital policies and procedures. Continue HBOT as ordered. Yes [name], [name] ([id number]) [id number] Page 2 of 2 Electronic Signature(s) Signed: 12/25/2022 11:14:04 AM By: Darren Shan DO Previous Signature: 12/24/2022 2:36:53 PM Version By: [name] CHT EMT BS , , Previous Signature: 12/25/2022 10:13:15 AM Version By: Darren Shan DO Previous Signature: 12/24/2022 12:27:02 PM Version By: [name] CHT EMT BS , , Previous Signature: 12/24/2022 12:31:10 PM Version By: Darren Shan DO Entered By: [name] on 12/25/2022  10:15:08 -------------------------------------------------------------------------------- HBO Safety Checklist Details Patient Name: Date of Service: [name] [facility] [name]. 12/24/2022 10:00 [name]: [id number] Patient Account  Number: [id number] Date of Birth/Sex: Treating RN: Jul 15, 1951 (72 y.Darren. [name] Primary Care Delphia Kaylor: [name] Other Clinician: Donavan [name] Referring [name]: Treating [name]/Extender: [name] Ngetich, [name] in Treatment: 23 HBO Safety Checklist Items Safety Checklist Consent Form Signed Patient voided / foley secured and emptied When did you last eato 0900 Last dose of injectable or oral agent n/[name] Ostomy pouch emptied and vented if applicable NA All implantable devices assessed, documented and approved NA Intravenous access site secured and place R arm PICC Valuables secured Linens and cotton and cotton/polyester blend (less than 51% polyester) Personal oil-based products / skin lotions / body lotions removed Wigs or hairpieces removed NA Smoking or tobacco materials removed NA Books / newspapers / magazines / loose paper removed Cologne, aftershave, perfume and deodorant removed Jewelry removed (may wrap wedding band) Make-up removed NA Hair care products removed NA Battery operated devices (external) removed Heating patches and chemical warmers removed Titanium eyewear removed Nail polish cured greater than 10 hours Casting material cured greater than 10 hours Hearing aids removed NA Loose dentures or partials removed NA Prosthetics have been removed NA Patient demonstrates correct use of air break device (if applicable) Patient concerns have been addressed Patient grounding bracelet on and cord attached to chamber Specifics for Inpatients (complete in addition to above) Medication sheet sent with patient NA Intravenous medications needed or due during therapy sent with  patient NA Drainage tubes (e.g. nasogastric tube or chest tube secured and vented) NA Endotracheal or Tracheotomy tube secured NA Cuff deflated of air and inflated with saline NA Airway suctioned NA Notes Paper version used prior to treatment. Electronic Signature(s) Signed: 12/24/2022 12:19:44 PM By: [name] CHT EMT BS , , Entered By: [name] on 12/24/2022 12:19:44

## 2022-12-25 NOTE — Progress Notes (Signed)
[name] [name] [id number] Page 1 of 10 Visit Report for 12/22/2022 Arrival Information Details Patient Name: Date of Service: [name] [facility] [name]. 12/22/2022 12:45 PM Medical Record Number: [id number] Patient Account Number: [id number] Date of Birth/Sex: Treating RN: 02-10-1951 (72 y.Darren. [name] Primary Care Jahaira Earnhart: [name] Other Clinician: Referring Rajvi Armentor: Treating Ronny Ruddell/Extender: [name] Ngetich, [name] in Treatment: 22 Visit Information History Since Last Visit Added or deleted any medications: No Patient Arrived: Wheel Chair Any new allergies or adverse reactions: No Arrival Time: 12:50 Had [name] fall or experienced change in No Accompanied By: daughter activities of daily living that may affect Transfer Assistance: Manual risk of falls: Patient Identification Verified: Yes Signs or symptoms of abuse/neglect since last visito No Secondary Verification Process Completed: Yes Hospitalized since last visit: No Patient Requires Transmission-Based Precautions: No Implantable device outside of the clinic excluding No Patient Has Alerts: Yes cellular tissue based products placed in the center Patient Alerts: Patient on Blood Thinner since last visit: Has Dressing in Place as Prescribed: Yes Has Compression in Place as Prescribed: No Pain Present Now: No Notes See [name] now- just completed HBO treatment. Patient had daughter cut the compression wrap half way down due to itching. Explained unable to apply lotion due to HBO. Electronic Signature(s) Signed: 12/23/2022 5:48:24 PM By: Deon Pilling RN, BSN Entered By: Deon Pilling on 12/22/2022 12:52:45 -------------------------------------------------------------------------------- Encounter Discharge Information Details Patient Name: Date of Service: [name] [facility] [name]. 12/22/2022 12:45 PM Medical Record Number: [id number] Patient Account Number:  [id number] Date of Birth/Sex: Treating RN: 09-Dec-1950 (45 y.Darren. [name] Primary Care Adel Neyer: [name] Other Clinician: Referring Jahnai Slingerland: Treating Yomar Mejorado/Extender: [name] Ngetich, [name] in Treatment: 22 Encounter Discharge Information Items Post Procedure Vitals Discharge Condition: Stable Temperature (F): 97.6 Ambulatory Status: Wheelchair Pulse (bpm): 63 Discharge Destination: Home Respiratory Rate (breaths/min): 18 Transportation: Private Auto Blood Pressure (mmHg): 137/71 Accompanied By: daughter Schedule Follow-up Appointment: Yes Clinical Summary of Care: Electronic Signature(s) Signed: 12/23/2022 5:48:24 PM By: Deon Pilling RN, BSN Entered By: Deon Pilling on 12/22/2022 13:28:17 [name] ([id number].pdf Page 2 of 10 -------------------------------------------------------------------------------- Lower Extremity Assessment Details Patient Name: Date of Service: [name] [facility] [name]. 12/22/2022 12:45 PM Medical Record Number: [id number] Patient Account Number: [id number] Date of Birth/Sex: Treating RN: Sep 16, 1951 (43 y.Darren. [name] Primary Care Lamarco Gudiel: [name] Other Clinician: Referring Talmadge Ganas: Treating Dyer Klug/Extender: [name] Ngetich, [name] in Treatment: 22 Edema Assessment Assessed: [Left: Yes] [Right: Yes] Edema: [Left: Yes] [Right: Yes] Calf Left: Right: Point of Measurement: 39 cm From Medial Instep 36 cm 44 cm Ankle Left: Right: Point of Measurement: 8 cm From Medial Instep 26 cm 23 cm Vascular Assessment Pulses: Dorsalis Pedis Palpable: [Left:Yes] [Right:Yes] Electronic Signature(s) Signed: 12/23/2022 5:48:24 PM By: Deon Pilling RN, BSN Entered By: Deon Pilling on 12/22/2022 12:53:57 -------------------------------------------------------------------------------- Multi Wound Chart Details Patient Name: Date of Service: [name]  [name] 12/22/2022 12:45 PM Medical Record Number: [id number] Patient Account Number: [id number] Date of Birth/Sex: Treating RN: [birth year] (6 y.Darren. M) Primary Care Mry Lamia: [name] Other Clinician: Referring Kierria Feigenbaum: Treating Annamary Buschman/Extender: [name] Ngetich, [name] in Treatment: 22 Vital Signs Height(in): 68 Capillary Blood Glucose(mg/dl): 133 Weight(lbs): 185 Pulse(bpm): 63 Body Mass Index(BMI): 28.1 Blood Pressure(mmHg): 137/71 Temperature(F): 97.6 Respiratory Rate(breaths/min): 18 [1:Photos:] [N/[name]:N/[name] [id number].pdf Page 3 of 10] Right Achilles Left, Plantar Foot N/[name] Wound Location: Surgical Injury Gradually Appeared N/[name] Wounding Event: Diabetic Wound/Ulcer of the  Lower Diabetic Wound/Ulcer of the Lower N/[name] Primary Etiology: Extremity Extremity Sleep Apnea, Arrhythmia, Congestive Sleep Apnea, Arrhythmia, Congestive N/[name] Comorbid History: Heart Failure, Hypertension, Peripheral Heart Failure, Hypertension, Peripheral Arterial Disease, Type II Diabetes Arterial Disease, Type II Diabetes 07/16/2022 03/09/2022 N/[name] Date Acquired: 22 22 N/[name] Weeks of Treatment: Open Open N/[name] Wound Status: No No N/[name] Wound Recurrence: 1x0.4x0.1 0.4x0.3x0.6 N/[name] Measurements L x W x D (cm) 0.314 0.094 N/[name] [name] (cm) : rea 0.031 0.057 N/[name] Volume (cm) : 98.50% 57.30% N/[name] % Reduction in [name] rea: 99.70% 13.60% N/[name] % Reduction in Volume: 3 Position 1 (Darren'clock): 0.9 Maximum Distance 1 (cm): No Yes N/[name] Tunneling: Grade 2 Grade 3 N/[name] Classification: None Present Medium N/[name] Exudate [name] mount: N/[name] Serosanguineous N/[name] Exudate Type: N/[name] red, brown N/[name] Exudate Color: Distinct, outline attached Distinct, outline attached N/[name] Wound Margin: None Present (0%) Large (67-100%) N/[name] Granulation [name] mount: N/[name] Pink, Friable N/[name] Granulation Quality: Large (67-100%) Small (1-33%) N/[name] Necrotic [name] mount: Eschar Adherent Slough N/[name] Necrotic Tissue: Fat Layer  (Subcutaneous Tissue): Yes Fat Layer (Subcutaneous Tissue): Yes N/[name] Exposed Structures: Fascia: No Bone: Yes Tendon: No Fascia: No Muscle: No Tendon: No Joint: No Muscle: No Bone: No Joint: No Large (67-100%) Small (1-33%) N/[name] Epithelialization: Debridement - Selective/Open Wound Debridement - Excisional N/[name] Debridement: Pre-procedure Verification/Time Out 13:10 13:10 N/[name] Taken: Lidocaine 4% Topical Solution Lidocaine 4% Topical Solution N/[name] Pain Control: Callus Callus, Subcutaneous, Slough N/[name] Tissue Debrided: Skin/Epidermis Skin/Subcutaneous Tissue N/[name] Level: 9 0.25 N/[name] Debridement [name] (sq cm): rea Curette Curette N/[name] Instrument: Minimum Minimum N/[name] Bleeding: Pressure Pressure N/[name] Hemostasis [name] chieved: 0 0 N/[name] Procedural Pain: 0 0 N/[name] Post Procedural Pain: Procedure was tolerated well Procedure was tolerated well N/[name] Debridement Treatment Response: 1x0.3x0.1 0.4x0.3x0.6 N/[name] Post Debridement Measurements L x W x D (cm) 0.024 0.057 N/[name] Post Debridement Volume: (cm) Excoriation: No Callus: Yes N/[name] Periwound Skin Texture: Induration: No Excoriation: No Callus: No Induration: No Crepitus: No Crepitus: No Rash: No Rash: No Scarring: No Scarring: No Dry/Scaly: Yes Maceration: No N/[name] Periwound Skin Moisture: Maceration: No Dry/Scaly: No Atrophie Blanche: No Atrophie Blanche: No N/[name] Periwound Skin Color: Cyanosis: No Cyanosis: No Ecchymosis: No Ecchymosis: No Erythema: No Erythema: No Hemosiderin Staining: No Hemosiderin Staining: No Mottled: No Mottled: No Pallor: No Pallor: No Rubor: No Rubor: No Cellular or Tissue Based Product Debridement N/[name] Procedures Performed: Debridement Treatment Notes Wound #1 (Achilles) Wound Laterality: Right Cleanser Soap and Water Discharge Instruction: May shower and wash wound with dial antibacterial soap and water prior to dressing change. Wound Cleanser Discharge Instruction: Cleanse the wound with  wound cleanser prior to applying [name] clean dressing using gauze sponges, not tissue or cotton balls. Peri-Wound Care Topical KADON, RAUCH [name] ([id number]) [id number] Page 4 of 10 Primary Dressing Grafix 11 Discharge Instruction: applied by Nance Mccombs. adpatic and steri-strips Discharge Instruction: to secure grafix in place. do not remove. Secondary Dressing ABD Pad, 5x9 Discharge Instruction: Apply over primary dressing as directed. Woven Gauze Sponge, Non-Sterile 4x4 in Discharge Instruction: Apply over primary dressing as directed. Secured With Compression Wrap Kerlix Roll 4.5x3.1 (in/yd) Discharge Instruction: Apply Kerlix and Coban compression as directed. Coban Self-Adherent Wrap 4x5 (in/yd) Discharge Instruction: Apply over Kerlix as directed. Compression Stockings Add-Ons Wound #2 (Foot) Wound Laterality: Plantar, Left Cleanser Wound Cleanser Discharge Instruction: Cleanse the wound with wound cleanser prior to applying [name] clean dressing using gauze sponges, not tissue or cotton balls. Peri-Wound Care Skin Prep Discharge Instruction: Use skin prep as directed Topical Primary Dressing  Dakin's Solution 0.25%, 16 (oz) Discharge Instruction: Moisten gauze with Dakin's solution Secondary Dressing Optifoam Non-Adhesive Dressing, 4x4 in Discharge Instruction: Apply over primary dressing as directed. Woven Gauze Sponge, Non-Sterile 4x4 in Discharge Instruction: Apply over primary dressing as directed. Woven Gauze Sponges 2x2 in Discharge Instruction: Apply over primary dressing as directed. Secured With Conforming Stretch Gauze Bandage, Sterile 2x75 (in/in) Discharge Instruction: Secure with stretch gauze as directed. 38M Medipore H Soft Cloth Surgical T ape, 4 x 10 (in/yd) Discharge Instruction: Secure with tape as directed. Compression Wrap Compression Stockings Add-Ons Electronic Signature(s) Signed: 12/22/2022 1:46:46 PM By: [name]  DO Entered By: [name] on 12/22/2022 13:39:41 [name] ([id number].pdf Page 5 of 10 -------------------------------------------------------------------------------- Multi-Disciplinary Care Plan Details Patient Name: Date of Service: [name] [facility] [name]. 12/22/2022 12:45 PM Medical Record Number: [id number] Patient Account Number: [id number] Date of Birth/Sex: Treating RN: 1951-09-10 (26 y.Darren. [name] Primary Care Lamere Lightner: [name] Other Clinician: Referring Indio Santilli: Treating Benedict Kue/Extender: [name] Ngetich, [name] in Treatment: 22 Active Inactive Nutrition Nursing Diagnoses: Potential for alteratiion in Nutrition/Potential for imbalanced nutrition Goals: Patient/caregiver agrees to and verbalizes understanding of need to use nutritional supplements and/or vitamins as prescribed Date Initiated: 07/16/2022 Target Resolution Date: 02/05/2023 Goal Status: Active Patient/caregiver will maintain therapeutic glucose control Date Initiated: 07/16/2022 Target Resolution Date: 02/05/2023 Goal Status: Active Interventions: Assess HgA1c results as ordered upon admission and as needed Provide education on nutrition Treatment Activities: Education provided on Nutrition : 10/19/2022 Obtain HgA1c : 07/16/2022 Patient referred to Primary Care Physician for further nutritional evaluation : 07/16/2022 Notes: Pain, Acute or Chronic Nursing Diagnoses: Pain, acute or chronic: actual or potential Potential alteration in comfort, pain Goals: Patient will verbalize adequate pain control and receive pain control interventions during procedures as needed Date Initiated: 07/16/2022 Target Resolution Date: 02/05/2023 Goal Status: Active Interventions: Encourage patient to take pain medications as prescribed Provide education on pain management Reposition patient for comfort Treatment Activities: Administer pain control  measures as ordered : 07/16/2022 Notes: Electronic Signature(s) Signed: 12/23/2022 5:48:24 PM By: Deon Pilling RN, BSN Entered By: Deon Pilling on 12/22/2022 12:59:37 -------------------------------------------------------------------------------- Pain Assessment Details Patient Name: Date of Service: [name]. 12/22/2022 12:45 PM Medical Record Number: [id number] Patient Account Number: [id number] Date of Birth/Sex: Treating RN: 03/06/1951 (40 y.Darren. [name] Primary Care Evi Mccomb: [name] Other Clinician: BATUHAN, [name] [id number] Page 6 of 10 Referring Samin Milke: Treating Jasmine Mcbeth/Extender: [name] Ngetich, [name] in Treatment: 22 Active Problems Location of Pain Severity and Description of Pain Patient Has Paino No Site Locations Rate the pain. Current Pain Level: 0 Pain Management and Medication Current Pain Management: Medication: No Cold Application: No Rest: No Massage: No Activity: No T.E.N.S.: No Heat Application: No Leg drop or elevation: No Is the Current Pain Management Adequate: Adequate How does your wound impact your activities of daily livingo Sleep: No Bathing: No Appetite: No Relationship With Others: No Bladder Continence: No Emotions: No Bowel Continence: No Work: No Toileting: No Drive: No Dressing: No Hobbies: No Electronic Signature(s) Signed: 12/23/2022 5:48:24 PM By: Deon Pilling RN, BSN Entered By: Deon Pilling on 12/22/2022 12:52:00 -------------------------------------------------------------------------------- Patient/Caregiver Education Details Patient Name: Date of Service: [name]. 2/13/2024andnbsp12:45 PM Medical Record Number: [id number] Patient Account Number: [id number] Date of Birth/Gender: Treating RN: 1951/06/06 (8 y.Darren. [name] Primary Care Physician: [name] Other Clinician: Referring Physician: Treating  Physician/Extender: [name] Ngetich, [name] in  Treatment: 22 Education Assessment Education Provided To: Patient Education Topics Provided Wound/Skin Impairment: Handouts: Caring for Your Ulcer Methods: Explain/Verbal Responses: Reinforcements needed [name], [name] [id number] Page 7 of 10 Electronic Signature(s) Signed: 12/23/2022 5:48:24 PM By: Deon Pilling RN, BSN Entered By: Deon Pilling on 12/22/2022 12:59:50 -------------------------------------------------------------------------------- Wound Assessment Details Patient Name: Date of Service: [name] [facility] [name]. 12/22/2022 12:45 PM Medical Record Number: [id number] Patient Account Number: [id number] Date of Birth/Sex: Treating RN: 1951-05-12 (54 y.Darren. [name] Primary Care Nitara Szczerba: [name] Other Clinician: Referring Missy Baksh: Treating Saajan Willmon/Extender: [name] Ngetich, [name] in Treatment: 22 Wound Status Wound Number: 1 Primary Diabetic Wound/Ulcer of the Lower Extremity Etiology: Wound Location: Right Achilles Wound Open Wounding Event: Surgical Injury Status: Date Acquired: 07/16/2022 Comorbid Sleep Apnea, Arrhythmia, Congestive Heart Failure, Hypertension, Weeks Of Treatment: 22 History: Peripheral Arterial Disease, Type II Diabetes Clustered Wound: No Photos Wound Measurements Length: (cm) 1 Width: (cm) 0.4 Depth: (cm) 0.1 Area: (cm) 0.314 Volume: (cm) 0.031 % Reduction in Area: 98.5% % Reduction in Volume: 99.7% Epithelialization: Large (67-100%) Tunneling: No Undermining: No Wound Description Classification: Grade 2 Wound Margin: Distinct, outline attached Exudate Amount: None Present Foul Odor After Cleansing: No Slough/Fibrino No Wound Bed Granulation Amount: None Present (0%) Exposed Structure Necrotic Amount: Large (67-100%) Fascia Exposed: No Necrotic Quality: Eschar Fat Layer (Subcutaneous Tissue)  Exposed: Yes Tendon Exposed: No Muscle Exposed: No Joint Exposed: No Bone Exposed: No Periwound Skin Texture Texture Color No Abnormalities Noted: No No Abnormalities Noted: No Callus: No Atrophie Blanche: No Crepitus: No Cyanosis: No Excoriation: No Ecchymosis: No Induration: No Erythema: No [name], [name] ([id number] Page 8 of 10 Induration: No Erythema: No Rash: No Hemosiderin Staining: No Scarring: No Mottled: No Pallor: No Moisture Rubor: No No Abnormalities Noted: No Dry / Scaly: Yes Maceration: No Treatment Notes Wound #1 (Achilles) Wound Laterality: Right Cleanser Soap and Water Discharge Instruction: May shower and wash wound with dial antibacterial soap and water prior to dressing change. Wound Cleanser Discharge Instruction: Cleanse the wound with wound cleanser prior to applying [name] clean dressing using gauze sponges, not tissue or cotton balls. Peri-Wound Care Topical Primary Dressing Grafix 11 Discharge Instruction: applied by Reynold Mantell. adpatic and steri-strips Discharge Instruction: to secure grafix in place. do not remove. Secondary Dressing ABD Pad, 5x9 Discharge Instruction: Apply over primary dressing as directed. Woven Gauze Sponge, Non-Sterile 4x4 in Discharge Instruction: Apply over primary dressing as directed. Secured With Compression Wrap Kerlix Roll 4.5x3.1 (in/yd) Discharge Instruction: Apply Kerlix and Coban compression as directed. Coban Self-Adherent Wrap 4x5 (in/yd) Discharge Instruction: Apply over Kerlix as directed. Compression Stockings Add-Ons Electronic Signature(s) Signed: 12/23/2022 5:48:24 PM By: Deon Pilling RN, BSN Entered By: Deon Pilling on 12/22/2022 12:58:42 -------------------------------------------------------------------------------- Wound Assessment Details Patient Name: Date of Service: [name] [facility] [name]. 12/22/2022 12:45 PM Medical Record Number:  [id number] Patient Account Number: [id number] Date of Birth/Sex: Treating RN: August 02, 1951 (77 y.Darren. [name] Primary Care Roselynn Whitacre: [name] Other Clinician: Referring Noriel Guthrie: Treating Syrianna Schillaci/Extender: [name] Ngetich, [name] in Treatment: 22 Wound Status Wound Number: 2 Primary Diabetic Wound/Ulcer of the Lower Extremity Etiology: Wound Location: Left, Plantar Foot Wound Open Wounding Event: Gradually Appeared Status: Date Acquired: 03/09/2022 Comorbid Sleep Apnea, Arrhythmia, Congestive Heart Failure, Hypertension, Weeks Of Treatment: 22 History: Peripheral Arterial Disease, Type II Diabetes Clustered Wound: No [name], [name] ([id number] Page 9 of 10 Photos Wound Measurements Length: (cm) 0.4 Width: (cm) 0.3 Depth: (cm) 0.6 Area: (  cm) 0.094 Volume: (cm) 0.057 % Reduction in Area: 57.3% % Reduction in Volume: 13.6% Epithelialization: Small (1-33%) Tunneling: Yes Position (Darren'clock): 3 Maximum Distance: (cm) 0.9 Undermining: No Wound Description Classification: Grade 3 Wound Margin: Distinct, outline attached Exudate Amount: Medium Exudate Type: Serosanguineous Exudate Color: red, brown Foul Odor After Cleansing: No Slough/Fibrino Yes Wound Bed Granulation Amount: Large (67-100%) Exposed Structure Granulation Quality: Pink, Friable Fascia Exposed: No Necrotic Amount: Small (1-33%) Fat Layer (Subcutaneous Tissue) Exposed: Yes Necrotic Quality: Adherent Slough Tendon Exposed: No Muscle Exposed: No Joint Exposed: No Bone Exposed: Yes Periwound Skin Texture Texture Color No Abnormalities Noted: No No Abnormalities Noted: No Callus: Yes Atrophie Blanche: No Crepitus: No Cyanosis: No Excoriation: No Ecchymosis: No Induration: No Erythema: No Rash: No Hemosiderin Staining: No Scarring: No Mottled: No Pallor: No Moisture Rubor: No No Abnormalities Noted: No Dry / Scaly:  No Maceration: No Treatment Notes Wound #2 (Foot) Wound Laterality: Plantar, Left Cleanser Wound Cleanser Discharge Instruction: Cleanse the wound with wound cleanser prior to applying [name] clean dressing using gauze sponges, not tissue or cotton balls. Peri-Wound Care Skin Prep Discharge Instruction: Use skin prep as directed Topical Primary Dressing Dakin's Solution 0.25%, 16 (oz) Discharge Instruction: Moisten gauze with Dakin's solution Secondary Dressing Optifoam Non-Adhesive Dressing, 4x4 in [name], [name] ([id number]) [id number] Page 10 of 10 Discharge Instruction: Apply over primary dressing as directed. Woven Gauze Sponge, Non-Sterile 4x4 in Discharge Instruction: Apply over primary dressing as directed. Woven Gauze Sponges 2x2 in Discharge Instruction: Apply over primary dressing as directed. Secured With Conforming Stretch Gauze Bandage, Sterile 2x75 (in/in) Discharge Instruction: Secure with stretch gauze as directed. 12M Medipore H Soft Cloth Surgical T ape, 4 x 10 (in/yd) Discharge Instruction: Secure with tape as directed. Compression Wrap Compression Stockings Add-Ons Electronic Signature(s) Signed: 12/23/2022 5:48:24 PM By: Deon Pilling RN, BSN Entered By: Deon Pilling on 12/22/2022 12:59:01 -------------------------------------------------------------------------------- Vitals Details Patient Name: Date of Service: [name]. 12/22/2022 12:45 PM Medical Record Number: [id number] Patient Account Number: [id number] Date of Birth/Sex: Treating RN: 21-Nov-1950 (31 y.Darren. [name] Primary Care Jasilyn Holderman: [name] Other Clinician: Referring Mang Hazelrigg: Treating Twinkle Sockwell/Extender: [name] Ngetich, [name] in Treatment: 22 Vital Signs Time Taken: 12:32 Temperature (F): 97.6 Height (in): 68 Pulse (bpm): 63 Weight (lbs): 185 Respiratory Rate (breaths/min): 18 Body Mass Index (BMI): 28.1 Blood  Pressure (mmHg): 137/71 Capillary Blood Glucose (mg/dl): 133 Reference Range: 80 - 120 mg / dl Notes see post HBO VS. Electronic Signature(s) Signed: 12/22/2022 1:00:58 PM By: [name] CHT EMT BS , , Entered By: [name] on 12/22/2022 13:00:58

## 2022-12-26 DIAGNOSIS — M86472 Chronic osteomyelitis with draining sinus, left ankle and foot: Secondary | ICD-10-CM | POA: Diagnosis not present

## 2022-12-26 NOTE — Progress Notes (Signed)
ARKIN, HEMSWORTH A ([id number]) [id number] Page 1 of 1 Visit Report for 12/24/2022 SuperBill Details Patient Name: Date of Service: [name] [facility]. 12/24/2022 Medical Record Number: [id number] Patient Account Number: [id number] Date of Birth/Sex: Treating RN: Nov 13, 1950 (72 y.Darren. 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.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, left ankle and foot Facility Procedures CPT4 Code Description Modifier Quantity [id number]-(Facility Use Only) HBOT full body chamber, 47mn , 4 ICD-10 Diagnosis Description E11.621 Type 2 diabetes mellitus with foot ulcer 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

## 2022-12-28 ENCOUNTER — Telehealth: Payer: Self-pay

## 2022-12-28 ENCOUNTER — Encounter (HOSPITAL_BASED_OUTPATIENT_CLINIC_OR_DEPARTMENT_OTHER): Payer: Medicare HMO | Admitting: Internal Medicine

## 2022-12-28 ENCOUNTER — Ambulatory Visit: Payer: Medicare HMO | Admitting: Family

## 2022-12-28 DIAGNOSIS — B965 Pseudomonas (aeruginosa) (mallei) (pseudomallei) as the cause of diseases classified elsewhere: Secondary | ICD-10-CM | POA: Diagnosis not present

## 2022-12-28 DIAGNOSIS — L97529 Non-pressure chronic ulcer of other part of left foot with unspecified severity: Secondary | ICD-10-CM | POA: Diagnosis not present

## 2022-12-28 DIAGNOSIS — E11621 Type 2 diabetes mellitus with foot ulcer: Secondary | ICD-10-CM | POA: Diagnosis not present

## 2022-12-28 DIAGNOSIS — I5022 Chronic systolic (congestive) heart failure: Secondary | ICD-10-CM | POA: Diagnosis not present

## 2022-12-28 DIAGNOSIS — E1151 Type 2 diabetes mellitus with diabetic peripheral angiopathy without gangrene: Secondary | ICD-10-CM | POA: Diagnosis not present

## 2022-12-28 DIAGNOSIS — E1169 Type 2 diabetes mellitus with other specified complication: Secondary | ICD-10-CM | POA: Diagnosis not present

## 2022-12-28 DIAGNOSIS — B9689 Other specified bacterial agents as the cause of diseases classified elsewhere: Secondary | ICD-10-CM | POA: Diagnosis not present

## 2022-12-28 DIAGNOSIS — I110 Hypertensive heart disease with heart failure: Secondary | ICD-10-CM | POA: Diagnosis not present

## 2022-12-28 DIAGNOSIS — M86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, left ankle and foot: Secondary | ICD-10-CM | POA: Diagnosis not present

## 2022-12-28 NOTE — Telephone Encounter (Signed)
Better to maintain levels with oral antibiotics than not get anything from IV antibiotics. No major DDI with levofloxacin, so I think it would be fine. Sounds like he tolerated it well enough in January. Of course his CrCl is right at the dose reduction cut-off (~ 50 ml/min) based on his most recent Scr on 12/10/22. It was 1.9 compared to what seems like normal ~1.6-1.7. I would argue for continuing normal dosing. - [name]

## 2022-12-28 NOTE — Telephone Encounter (Signed)
Lele, RN with [facility] called stating that patient is being noncompliant with IV antibiotics. Patient has had 3 doses since visit with Dr.Wallace. Lele wanted to know what to do moving forward if patient needed to be on oral abx and have PICC line pulled.   [place], CMA

## 2022-12-29 ENCOUNTER — Encounter (HOSPITAL_BASED_OUTPATIENT_CLINIC_OR_DEPARTMENT_OTHER): Payer: Medicare HMO | Admitting: Internal Medicine

## 2022-12-29 DIAGNOSIS — M86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, left ankle and foot: Secondary | ICD-10-CM | POA: Diagnosis not present

## 2022-12-29 DIAGNOSIS — L97813 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: Secondary | ICD-10-CM | POA: Diagnosis not present

## 2022-12-29 DIAGNOSIS — E11621 Type 2 diabetes mellitus with foot ulcer: Secondary | ICD-10-CM | POA: Diagnosis not present

## 2022-12-29 DIAGNOSIS — L97522 Non-pressure chronic ulcer of other part of left foot with fat layer exposed: Secondary | ICD-10-CM | POA: Diagnosis not present

## 2022-12-29 DIAGNOSIS — L97518 Non-pressure chronic ulcer of other part of right foot with other specified severity: Secondary | ICD-10-CM | POA: Diagnosis not present

## 2022-12-29 DIAGNOSIS — E1169 Type 2 diabetes mellitus with other specified complication: Secondary | ICD-10-CM | POA: Diagnosis not present

## 2022-12-29 DIAGNOSIS — E1151 Type 2 diabetes mellitus with diabetic peripheral angiopathy without gangrene: Secondary | ICD-10-CM | POA: Diagnosis not present

## 2022-12-29 DIAGNOSIS — Z794 Long term (current) use of insulin: Secondary | ICD-10-CM | POA: Diagnosis not present

## 2022-12-29 DIAGNOSIS — I110 Hypertensive heart disease with heart failure: Secondary | ICD-10-CM | POA: Diagnosis not present

## 2022-12-29 LAB — LAB REPORT - SCANNED: EGFR: 47

## 2022-12-29 MED ORDER — LEVOFLOXACIN 750 MG PO TABS: 750.0000 mg | ORAL_TABLET | Freq: Every day | ORAL | 0 refills | Status: DC

## 2022-12-29 NOTE — Addendum Note (Signed)
Addended by: Tomi Bamberger on: 12/29/2022 10:32 AM   Modules accepted: Orders

## 2022-12-29 NOTE — Telephone Encounter (Signed)
Spoke with patient and his daughter [name] - patient agreeable to switching to oral Levaquin 750 mg 1 tablet QD. RX sent to pharmacy.   Message sent to Carolynn Sayers, RN and [facility] staff - patient transitioning to oral antibiotics and patient needs PICC line pulled today 12/29/2022.   [place], CMA

## 2022-12-30 ENCOUNTER — Ambulatory Visit (INDEPENDENT_AMBULATORY_CARE_PROVIDER_SITE_OTHER): Payer: Medicare HMO | Admitting: Adult Health

## 2022-12-30 ENCOUNTER — Encounter (HOSPITAL_BASED_OUTPATIENT_CLINIC_OR_DEPARTMENT_OTHER): Payer: Medicare HMO | Admitting: Physician Assistant

## 2022-12-30 ENCOUNTER — Encounter: Payer: Self-pay | Admitting: Adult Health

## 2022-12-30 VITALS — BP 142/78 | HR 96 | Temp 97.3°F | Resp 18 | Ht 68.0 in | Wt 216.0 lb

## 2022-12-30 DIAGNOSIS — N1832 Chronic kidney disease, stage 3b: Secondary | ICD-10-CM | POA: Diagnosis not present

## 2022-12-30 DIAGNOSIS — E785 Hyperlipidemia, unspecified: Secondary | ICD-10-CM

## 2022-12-30 DIAGNOSIS — Z794 Long term (current) use of insulin: Secondary | ICD-10-CM | POA: Diagnosis not present

## 2022-12-30 DIAGNOSIS — N183 Chronic kidney disease, stage 3 unspecified: Secondary | ICD-10-CM

## 2022-12-30 DIAGNOSIS — E1169 Type 2 diabetes mellitus with other specified complication: Secondary | ICD-10-CM

## 2022-12-30 DIAGNOSIS — E1122 Type 2 diabetes mellitus with diabetic chronic kidney disease: Secondary | ICD-10-CM

## 2022-12-30 NOTE — Progress Notes (Signed)
Location:  [place] of Service:   clinic    CODE STATUS: full   No Known Allergies  Chief Complaint  Patient presents with   Medical Management of Chronic Issues    Patient is here for 33M F/U for DM II ([place]), with 3B CKD    HPI:  He is a 72 year old man who is being seen for his 6 month follow up. He does not check her cbg's. He doe snot monitor his blood pressure his repeat blood pressure is 142/78. He is concerned today that his testosterone level may be low. He had read from an expert that this level should not be lower due to age. He denies any pain. He continues with wound management and hyperbaric chamber.   Past Medical History:  Diagnosis Date   A-fib [facility])    a. (06/04/14) TEE-DC-CV; succesful; large LA 6.2 cm   ADHD    Ascites    Athlete's foot    Chronic systolic heart failure ([place])    a. 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, unspecified CKD stage, unspecified whether long term insulin use (HCC)    Heart murmur    HTN (hypertension)    OSA (obstructive sleep apnea)    PVD (peripheral vascular disease) (HCC)    s/p great toe amputation    Past Surgical History:  Procedure Laterality Date   ABDOMINAL AORTOGRAM W/LOWER EXTREMITY N/A 05/19/2021   Procedure: ABDOMINAL AORTOGRAM W/LOWER EXTREMITY;  Surgeon: Marty Heck, MD;  Location: [place] CV LAB;  Service: Cardiovascular;  Laterality: N/A;   ABDOMINAL AORTOGRAM W/LOWER EXTREMITY Right 06/11/2022   Procedure: ABDOMINAL AORTOGRAM W/LOWER EXTREMITY;  Surgeon: Marty Heck, MD;  Location: [place] CV LAB;  Service: Cardiovascular;  Laterality: Right;   ABDOMINAL AORTOGRAM W/LOWER EXTREMITY Left 12/10/2022   Procedure: ABDOMINAL AORTOGRAM W/LOWER EXTREMITY;  Surgeon: Marty Heck, MD;  Location: [place] CV LAB;  Service: Cardiovascular;  Laterality: Left;   AMPUTATION Left 05/22/2021   Procedure: LEFT FIFTH TOE AMPUTATION;  Surgeon: Marty Heck, MD;  Location: [place];  Service: Vascular;  Laterality: Left;   CARDIOVERSION N/A 06/04/2014   Procedure: CARDIOVERSION;  Surgeon: Larey Dresser, MD;  Location: [facility] ENDOSCOPY;  Service: Cardiovascular;  Laterality: N/A;   CARDIOVERSION N/A 11/06/2021   Procedure: CARDIOVERSION;  Surgeon: Larey Dresser, MD;  Location: [facility] ENDOSCOPY;  Service: Cardiovascular;  Laterality: N/A;   NOSE SURGERY     Nasal septum surgery   PERIPHERAL VASCULAR BALLOON ANGIOPLASTY  06/11/2022   Procedure: PERIPHERAL VASCULAR BALLOON ANGIOPLASTY;  Surgeon: Marty Heck, MD;  Location: [place] CV LAB;  Service: Cardiovascular;;   PERIPHERAL VASCULAR BALLOON ANGIOPLASTY Left 12/10/2022   Procedure: PERIPHERAL VASCULAR BALLOON ANGIOPLASTY;  Surgeon: Marty Heck, MD;  Location: [place] CV LAB;  Service: Cardiovascular;  Laterality: Left;  Peroneal   PERIPHERAL VASCULAR INTERVENTION Left 05/19/2021   Procedure: PERIPHERAL VASCULAR INTERVENTION;  Surgeon: Marty Heck, MD;  Location: [place] CV LAB;  Service: Cardiovascular;  Laterality: Left;   TEE WITHOUT CARDIOVERSION N/A 06/04/2014   Procedure: TRANSESOPHAGEAL ECHOCARDIOGRAM (TEE);  Surgeon: Larey Dresser, MD;  Location: [place];  Service: Cardiovascular;  Laterality: N/A;   TEE WITHOUT CARDIOVERSION N/A 01/06/2016  Procedure: TRANSESOPHAGEAL ECHOCARDIOGRAM (TEE);  Surgeon: Fay Records, MD;  Location: [place];  Service: Cardiovascular;  Laterality: N/A;   TEE WITHOUT CARDIOVERSION N/A 11/06/2021   Procedure: TRANSESOPHAGEAL ECHOCARDIOGRAM (TEE);  Surgeon: Larey Dresser, MD;  Location: [facility] ENDOSCOPY;  Service: Cardiovascular;  Laterality: N/A;   VASECTOMY      Social History   Socioeconomic History   Marital status: Single     Spouse name: Not on file   Number of children: Not on file   Years of education: Not on file   Highest education level: Not on file  Occupational History   Occupation: retired  Tobacco Use   Smoking status: Some Days    Years: 52.00    Types: Cigarettes, Cigars    Last attempt to quit: 05/2021    Years since quitting: 1.6    Passive exposure: Never   Smokeless tobacco: Never  Vaping Use   Vaping Use: Never used  Substance and Sexual Activity   Alcohol use: Not Currently   Drug use: Never   Sexual activity: Not on file  Other Topics Concern   Not on file  Social History Narrative   ** Merged History Encounter ** Lives in [place] by himself. Retired from [employer] and [employer] for [employer].       Tobacco use, amount per day now:   Past tobacco use, amount per day:   How many years did you use tobacco:   Alcohol use (drinks per week): N/A   Diet:   Do you drink/eat things with caffeine:   Marital status:  Divorced                                What year were you married? 2003   Do you live in a house, apartment, assisted living, condo, trailer, etc.? House   Is it one or more stories? One   How many persons live in your home?   Do you have pets in your home?( please list) N/A   Highest Level of education completed? Bachelors Degree   Current or past profession: Teacher-Special Ed   Do you exercise?  Yes                                Type and how often? Daily squats, and push ups.   Do you have a living will? No   Do you have a DNR form?  No                                 If not, do you want to discuss one?   Do you have signed POA/HPOA forms?  No                      If so, please bring to you appointment      Do you have any difficulty bathing or dressing yourself? Bathing only if pants not shirts/not shorts.   Do you have any difficulty preparing food or eating? No   Do you have any difficulty managing your medications? No   Do you have any difficulty  managing your finances? No   Do you have any difficulty affording your medications? No   Social Determinants of Radio broadcast assistant Strain: Not on file  Food  Insecurity: No Food Insecurity (11/10/2021)   Hunger Vital Sign    Worried About Running Out of Food in the Last Year: Never true    Ran Out of Food in the Last Year: Never true  Transportation Needs: No Transportation Needs (11/10/2021)   PRAPARE - Hydrologist (Medical): No    Lack of Transportation (Non-Medical): No  Physical Activity: Not on file  Stress: Not on file  Social Connections: Not on file  Intimate Partner Violence: Not on file   Family History  Problem Relation Age of Onset   Heart attack Mother        deceased   Diabetes Mother    Heart attack Sister       VITAL SIGNS BP (!) 142/78   Pulse 96   Temp (!) 97.3 F (36.3 C)   Resp 18   Ht 5' 8"$  (1.727 m)   Wt 216 lb (98 kg)   SpO2 98%   BMI 32.84 kg/m   Outpatient Encounter Medications as of 12/30/2022  Medication Sig   APPLE CIDER VINEGAR PO Take 30 mLs by mouth 4 (four) times a week.   ASHWAGANDHA PO Take by mouth.   blood glucose meter kit and supplies Dispense based on patient and insurance preference. Use up to four times daily as directed. (FOR ICD-10 E10.9, E11.9).   clopidogrel (PLAVIX) 75 MG tablet Take 1 tablet (75 mg total) by mouth daily.   diclofenac Sodium (VOLTAREN) 1 % GEL Apply 1 Application topically 4 (four) times daily as needed (pain).   empagliflozin (JARDIANCE) 10 MG TABS tablet TAKE 1 TABLET(10 MG) BY MOUTH DAILY   ENTRESTO 49-51 MG TAKE 1 TABLET BY MOUTH TWICE DAILY   furosemide (LASIX) 20 MG tablet TAKE 1 TABLET(20 MG) BY MOUTH TWICE DAILY   insulin glargine (LANTUS) 100 UNIT/ML injection INJECT 8 UNITS UNDER THE SKIN DAILY   Insulin Syringe-Needle U-100 (INSULIN SYRINGE 1CC/30GX5/16") 30G X 5/16" 1 ML MISC Use once daily for insulin injections. Dx:E11.22   levofloxacin (LEVAQUIN) 750 MG  tablet Take 1 tablet (750 mg total) by mouth daily.   metoprolol succinate (TOPROL XL) 50 MG 24 hr tablet Take 1 tablet (50 mg total) by mouth daily.   Misc Natural Products (JOINT SUPPORT PO) Take 1 Dose by mouth 3 (three) times a week.   spironolactone (ALDACTONE) 25 MG tablet Take 0.5 tablets (12.5 mg total) by mouth daily.   warfarin (COUMADIN) 6 MG tablet Take 0.5-1 tablets (3-6 mg total) by mouth See admin instructions. Take 6 mg on Mon and Fri Take 3 mg on Sun, Tues, Wed, Thurs, and Sat   No facility-administered encounter medications on file as of 12/30/2022.     SIGNIFICANT DIAGNOSTIC EXAMS   Review of Systems  Constitutional:  Negative for malaise/fatigue.  Respiratory:  Negative for cough and shortness of breath.   Cardiovascular:  Negative for chest pain, palpitations and leg swelling.  Gastrointestinal:  Negative for abdominal pain, constipation and heartburn.  Musculoskeletal:  Negative for back pain, joint pain and myalgias.  Skin: Negative.   Neurological:  Negative for dizziness.  Psychiatric/Behavioral:  The patient is not nervous/anxious.    Physical Exam Constitutional:      General: He is not in acute distress.    Appearance: He is well-developed. He is not diaphoretic.  Neck:     Thyroid: No thyromegaly.  Cardiovascular:     Rate and Rhythm: Normal rate and regular rhythm.     Pulses:  Normal pulses.     Heart sounds: Normal heart sounds.  Pulmonary:     Effort: Pulmonary effort is normal. No respiratory distress.     Breath sounds: Normal breath sounds.  Abdominal:     General: Bowel sounds are normal. There is no distension.     Palpations: Abdomen is soft.     Tenderness: There is no abdominal tenderness.  Musculoskeletal:        General: Normal range of motion.     Cervical back: Neck supple.     Right lower leg: No edema.     Left lower leg: No edema.  Lymphadenopathy:     Cervical: No cervical adenopathy.  Skin:    General: Skin is warm and  dry.  Neurological:     Mental Status: He is alert and oriented to person, place, and time.  Psychiatric:        Mood and Affect: Mood normal.      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

## 2022-12-30 NOTE — Patient Instructions (Signed)
No changes in medications at this time Return to clinic in 6 months

## 2022-12-31 ENCOUNTER — Telehealth: Payer: Self-pay

## 2022-12-31 ENCOUNTER — Encounter (HOSPITAL_BASED_OUTPATIENT_CLINIC_OR_DEPARTMENT_OTHER): Payer: Medicare HMO | Admitting: Internal Medicine

## 2022-12-31 DIAGNOSIS — M86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, left ankle and foot: Secondary | ICD-10-CM | POA: Diagnosis not present

## 2022-12-31 DIAGNOSIS — I110 Hypertensive heart disease with heart failure: Secondary | ICD-10-CM | POA: Diagnosis not present

## 2022-12-31 DIAGNOSIS — I5022 Chronic systolic (congestive) heart failure: Secondary | ICD-10-CM | POA: Diagnosis not present

## 2022-12-31 DIAGNOSIS — E11621 Type 2 diabetes mellitus with foot ulcer: Secondary | ICD-10-CM | POA: Diagnosis not present

## 2022-12-31 DIAGNOSIS — B9689 Other specified bacterial agents as the cause of diseases classified elsewhere: Secondary | ICD-10-CM | POA: Diagnosis not present

## 2022-12-31 DIAGNOSIS — B965 Pseudomonas (aeruginosa) (mallei) (pseudomallei) as the cause of diseases classified elsewhere: Secondary | ICD-10-CM | POA: Diagnosis not present

## 2022-12-31 DIAGNOSIS — L97529 Non-pressure chronic ulcer of other part of left foot with unspecified severity: Secondary | ICD-10-CM | POA: Diagnosis not present

## 2022-12-31 DIAGNOSIS — E1151 Type 2 diabetes mellitus with diabetic peripheral angiopathy without gangrene: Secondary | ICD-10-CM | POA: Diagnosis not present

## 2022-12-31 DIAGNOSIS — E1169 Type 2 diabetes mellitus with other specified complication: Secondary | ICD-10-CM | POA: Diagnosis not present

## 2022-12-31 LAB — COMPLETE METABOLIC PANEL WITH GFR
AG Ratio: 1.3 (calc) (ref 1.0–2.5)
ALT: 20 U/L (ref 9–46)
AST: 18 U/L (ref 10–35)
Albumin: 4.3 g/dL (ref 3.6–5.1)
Alkaline phosphatase (APISO): 46 U/L (ref 35–144)
BUN/Creatinine Ratio: 19 (calc) (ref 6–22)
BUN: 27 mg/dL — ABNORMAL HIGH (ref 7–25)
CO2: 30 mmol/L (ref 20–32)
Calcium: 9.1 mg/dL (ref 8.6–10.3)
Chloride: 106 mmol/L (ref 98–110)
Creat: 1.41 mg/dL — ABNORMAL HIGH (ref 0.70–1.28)
Globulin: 3.4 g/dL (calc) (ref 1.9–3.7)
Glucose, Bld: 161 mg/dL — ABNORMAL HIGH (ref 65–99)
Potassium: 4.1 mmol/L (ref 3.5–5.3)
Sodium: 143 mmol/L (ref 135–146)
Total Bilirubin: 0.4 mg/dL (ref 0.2–1.2)
Total Protein: 7.7 g/dL (ref 6.1–8.1)
eGFR: 53 mL/min/{1.73_m2} — ABNORMAL LOW (ref >=60)

## 2022-12-31 LAB — LIPID PANEL
Cholesterol: 162 mg/dL (ref <200)
HDL: 39 mg/dL — ABNORMAL LOW (ref >=40)
LDL Cholesterol (Calc): 97 mg/dL (calc)
Non-HDL Cholesterol (Calc): 123 mg/dL (calc) (ref <130)
Total CHOL/HDL Ratio: 4.2 (calc) (ref <5.0)
Triglycerides: 166 mg/dL — ABNORMAL HIGH (ref <150)

## 2022-12-31 LAB — TSH: TSH: 1.74 mIU/L (ref 0.40–4.50)

## 2022-12-31 LAB — HEMOGLOBIN A1C
Hgb A1c MFr Bld: 6.5 % of total Hgb — ABNORMAL HIGH (ref <5.7)
Mean Plasma Glucose: 140 mg/dL
eAG (mmol/L): 7.7 mmol/L

## 2022-12-31 NOTE — Telephone Encounter (Signed)
[name] returned my call and is aware patient is unable to do once daily IV abx and will need to be on oral abx. [name] stated that patient is doing IV antibiotics until PICC line is removed tomorrow and will then start Levaquin.    [place], CMA

## 2022-12-31 NOTE — Telephone Encounter (Signed)
Patient daughter [name] called stating that the wound care provider wanted patient to ask Dr.Wallace if he could be on IV abx that is once daily before PICC line is removed.    Per Dr.Wallace and [facility] pharmacist - there isn't an IV abx that patient could do once daily that will treat pseudomonas.   Left voicemail asking [name] to return my call.   [place], CMA

## 2023-01-01 ENCOUNTER — Encounter (HOSPITAL_BASED_OUTPATIENT_CLINIC_OR_DEPARTMENT_OTHER): Payer: Medicare HMO | Admitting: Internal Medicine

## 2023-01-01 NOTE — Progress Notes (Addendum)
[name] [name] ([id number]) [id number] Page 1 of 12 Visit Report for 12/29/2022 Chief Complaint Document Details Patient Name: Date of Service: [name] [name] [facility] [name]. 12/29/2022 11:00 [name]: [id number] Patient Account Number: [id number] Date of Birth/Sex: Treating RN: November 06, 1951 (72 y.Darren. M) Primary Care Provider: Marlowe [name] Other Clinician: Referring Provider: Treating Provider/Extender: [name], [name] in Treatment: 23 Information Obtained from: Patient Chief Complaint 07/16/2022; bilateral lower extremity wounds Electronic Signature(s) Signed: 12/29/2022 12:37:22 PM By: Darren Shan DO Entered By: [name] on 12/29/2022 12:13:52 -------------------------------------------------------------------------------- Cellular or Tissue Based Product Details Patient Name: Date of Service: [name], [name]. 12/29/2022 11:00 [name]: [id number] Patient Account Number: [id number] Date of Birth/Sex: Treating RN: 1951-07-12 (25 y.Darren. Mare Ferrari Primary Care Provider: Marlowe [name] Other Clinician: Referring Provider: Treating Provider/Extender: [name] in Treatment: 23 Cellular or Tissue Based Product Type Wound #1 Right Achilles Applied to: Performed By: Physician Darren Shan, DO Cellular or Tissue Based Product Type: Grafix prime Level of Consciousness (Pre-procedure): Awake and Alert Pre-procedure Verification/Time Out Yes - 11:50 Taken: Location: trunk / arms / legs Wound Size (sq cm): 0.4 Product Size (sq cm): 1.6 Waste Size (sq cm): 0 Amount of Product Applied (sq cm): 1.6 Instrument Used: Forceps Lot #: [id number] Expiration Date: 02/03/2024 Fenestrated: No Reconstituted: Yes Solution Type: saline Solution Amount: 74m Lot #: [id number] Expiration Date: 04/08/2025 Secured: Yes Secured With: Steri-Strips Dressing Applied:  Yes Primary Dressing: adaptic Procedural Pain: 0 Post Procedural Pain: 0 Response to Treatment: Procedure was tolerated well Level of Consciousness (Post- Awake and Alert procedure): Post Procedure Diagnosis Same as Pre-procedure Electronic Signature(s) Signed: 12/29/2022 3:41:53 PM By: [name] Signed: 12/31/2022 1:42:30 PM By: Darren CreamerRN, BSN Entered By: Darren Creameron 12/29/2022 15:05:46 [name] ([id number] Page 2 of 12 -------------------------------------------------------------------------------- Debridement Details Patient Name: Date [facility]: [name] 12[facility]29/2022 11:00 [name]: [id number] Account Number: [id number] of Birth/Sex: Treating RN: [birth year](89y.Darren. MMare FerrariPrimary Care Provider: NMarlowe SaxOther Clinician: Referring Provider: Treating Provider/Extender: Darren ShanNgetich, [name] in Treatment: 23 Debridement Performed for Assessment: Wound #1 Right Achilles Performed By: Physician Darren Shan DO Debridement Type: Debridement Severity of Tissue Pre Debridement: Fat layer exposed Level of Consciousness (Pre-procedure): Awake and Alert Pre-procedure Verification/Time Out Yes - 11:40 Taken: Start Time: 11:45 T Area Debrided (L x W): otal 2 (cm) x 0.2 (cm) = 0.4 (cm) Tissue and other material debrided: Non-Viable, Slough, Skin: Dermis , Skin: Epidermis, Slough Level: Skin/Epidermis Debridement Description: Selective/Open Wound Instrument: Curette Bleeding: Minimum Hemostasis Achieved: Pressure Procedural Pain: 0 Post Procedural Pain: 0 Response to Treatment: Procedure was tolerated well Level of Consciousness (Post- Awake and Alert procedure): Post Debridement Measurements of Total Wound Length: (cm) 2 Width: (cm) 0.2 Depth: (cm) 0.1 Volume: (cm) 0.031 Character of Wound/Ulcer Post Debridement:  Improved Severity of Tissue Post Debridement: Fat layer exposed Post Procedure Diagnosis Same as Pre-procedure Notes Scribed for Dr HHeber Carolinaby CSharyn Creamer Rn Electronic Signature(s) Signed: 12/29/2022 12:37:22 PM By: [name] Signed: 12/31/2022 1:42:30 PM By: Darren CreamerRN, BSN Entered By: Darren Creameron 12/29/2022 11:49:28 -------------------------------------------------------------------------------- HPI Details Patient Name: Date of Service: Darren NipSEPH [name]. 12/29/2022 11:00 [name]: [id number] Account Number: [id number] of Birth/Sex: Treating RN: [birth year]51[birth year](74y.Darren. M) Primary Care Provider: NMarlowe [name]  Other Clinician: Referring Provider: Treating Provider/Extender: [name], [name] in Treatment: 23 History of Present Illness HPI Description: Admission 07/16/2022 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 He is not sure how the wounds started. He has been following with podiatry for this issue. He had removal of the tendon to the right lower extremity by [name] He has been using Betadine to the wound beds. On 06/11/2022 he had [name] right posterior tibial artery angioplasty by [name] It was reported the patient is optimized after revascularization of the right lower extremity. His ABIs on the left were 0.91. He currently denies systemic signs of infection. He also does not wear shoes. He came in with Kerlix wrap to his feet bilaterally. This did not completely cover his feet. [name] ([id number]  [id number] Page 3 of 12 07/23/2022: This is [name] patient of Dr. HJodene Namthat she asked me to take [name] look at last week due to the significant involvement of the muscle and tendon on his right posterior leg wound. He needed an aggressive debridement and asked me if I would be able to perform this on her behalf. The patient is here today for that procedure. When his dressing was removed in clinic today, the wound was teeming with maggots. There is necrotic muscle, tendon, and fat, with [name] thick layer of slough on the wound. 9/21; patient presents for follow-up. He was debrided by Dr. CCeline Ahrat last clinic visit without any issues. He has been placing Dakin's wet-to-dry dressings to the right posterior wound. He has been using Medihoney to the left foot wound. He reports improvement in wound healing. He has no issues or complaints today. 9/28; patient with type 2 diabetes PAD status post revascularization. He underwent retrograde right PTA angioplasty. Last saw Dr. CCarlis Abbotton 9/12 and was felt to have [name] brisk posterior tibial signal at the right ankle. He had [name] pulsatile toe tracing that appeared adequate for healing. He has been using Santyl and Hydrofera Blue on the right Achilles area. He has [name] smaller area on the left fifth plantar metatarsal head They were apparently in the ER on 9/23 with swelling and discoloration above the wrap. They have [name] picture of the leg after the wrap was taken off which looks like that it was excessively tight superiorly 10/6; patient presents for follow-up. We have been using Santyl and Hydrofera Blue to the right lower extremity wound under Kerlix/Coban. T the left foot we Darren have been using silver alginate with Medihoney. He again comes in with no shoes. He has no issues or complaints today. 10/12; patient presents for follow-up. We have been using Santyl and Hydrofera Blue to the right lower extremity under Kerlix/Coban And silver alginate to  the left foot wound. He has no issues today. He denies signs of infection. 10/19; patient presents for follow-up. We continue to use Santyl and Hydrofera Blue to the right lower extremity under Kerlix/Coban and silver alginate to the left foot wound. There is been improvement in wound healing. 10/26; patient presents for follow-up. We have been using Santyl and Hydrofera Blue to the right lower extremity under Kerlix/Coban and silver alginate  to the left foot. There continues to be improvement in wound healing. Patient has no issues or complaints today. 11/2; the patient's area on the right Achilles heel looks quite healthy and is improved in terms of measurements. We have been using Hydrofera Blue. He is approved for Grafix On the left plantar foot wound over the fifth metatarsal head this tunnels over the lateral part of the met head. He is not offloading this at all 11/16; patient presents for follow-up. He has been using [name] surgical shoe with an offloading felt pad to the left lateral foot along with silver alginate. He has been approved for Grafix and this is available for placement today T the right lower extremity wound. Patient Is agreeable to this. We have been using Santyl and Darren Hydrofera Blue under Kerlix/Coban to this area. 11/21; patient presents for follow-up. We have been doing Grafix to the right lower extremity and silver alginate to the left foot wound. He has no issues or complaints today. 11/30; patient presents for follow-up. We have been doing Grafix to the right lower extremity wound and Medihoney with Hydrofera Blue to the left lateral foot wound. He reports pain to the left foot wound today. He did not obtain his x-ray. 12/11; patient presents for follow-up. He has been using Dakin's wet-to-dry dressings to the left plantar foot wound. We have been doing Grafix to the right lower extremity wound under compression therapy. He obtained his x-ray that showed mild periosteal  elevation at the resection site with the possibility of osteomyelitis. He currently denies systemic signs of infection. He has been taking doxycycline and Augmentin without issues. He is getting his INR checked by his primary care office. 12/18; patient presents for follow-up. He has been using Dakin's wet-to-dry dressings to the left foot wound. We have been placing Grafix under compression therapy to the right lower extremity. He is currently taking doxycycline and Augmentin. He denies signs of infection. 12/26; patient presents for follow-up. He has been using Dakin's wet-to-dry dressings to the left foot. He has been off oral antibiotics for potential bone biopsy. We have been placing Grafix under compression therapy to the right lower extremity. There has been improvement in wound healing to both sites. He states he is trying to aggressively offload the left foot. He currently denies signs of infection. 1/2; patient presents for follow-up. He has been doing Dakin's wet-to-dry dressings to the left foot. He saw Dr. Blenda [name] today and had [name] bone biopsy. Debridement was performed on the site. Dressing in place with Dakin's wet-to-dry. T the right leg we have been using Grafix under compression therapy. He Darren has no issues or complaints today. 1/9; patient presents for follow-up. We have been using Hydrofera Blue under compression therapy to the right lower extremity wound. This is well-healing. He has been using Dakin's wet-to-dry dressings to the left lateral foot wound. He has been taking ciprofloxacin due to culture results without issues. He was referred to infectious disease by podiatry. He does not have an appointment yet. 1/16; patient presents for follow-up. We were using collagen under Kerlix/Coban to the right lower extremity wound. He has been approved for Grafix and this was available for placement today. He continues to use Dakin's wet-to-dry dressings daily to the left foot. He started  levofloxacin. He has an appointment with infectious disease on 1/24. 1/23; patient presents for follow-up. Grafix was placed in standard fashion to the right lower extremity wound last week. He has been using Dakin's wet-to-dry dressings  to the left foot. He has been taking levofloxacin. He has an infectious disease appointment tomorrow as well as [name] pulmonology appointment. He followed up with vein and vascular on 1/16 and plan is for angiogram of the left lower extremity. He has [name] history of left peroneal angioplasty in 2022. 1/30; patient presents for follow-up. Grafix was placed in standard fashion to the right lower extremity wound last week. There has been improvement in healing here. He has been using Dakin's wet-to-dry dressings to the left foot. Patient saw infectious disease, Dr. Juleen China on 1/24 and plan is for PICC line on 2/5 with cefepime. For now he is taking levofloxacin. He has been cleared by pulmonology for HBO. Due to extensive heart history we have asked HBO clearance from his cardiologist as well. He has follow-up with them tomorrow. He currently has no issues or complaints today. 2/6; patient presents for follow-up. He started HBO therapy today and had no issues with his dive. He had an arteriogram on 2/1 by Dr. Carlis [name]. He had angioplasty of the left peroneal artery. He is optimized from [name] vascular standpoint. We have been using Grafix to the right lower extremity wound and Dakin's wet-to-dry dressings to the left foot wound. He started his IV antibiotics yesterday. 2/13; patient presents for follow-up. We have been using Grafix to the right lower extremity wound under compression therapy and Dakin's wet-to-dry dressings to the left lateral wound. He is continued IV antibiotics without issues. 2/20; Patient presents for follow-up. We have been using Grafix to the right lower extremity wound under compression and Dakin's wet-to-dry dressings to the left lateral foot wound. Per  patient since he cannot do two courses of IV antibiotics daily plan is to switch to oral antibiotics. Patient has not done HBO since 2/15. Plan is to be evaluated by ophthalmology to assure there are no issues with his vision. Electronic Signature(s) Signed: 12/29/2022 12:37:22 PM By: Boris Sharper ([id number]) [id number] Page 4 of 12 Entered By: [name] on 12/29/2022 12:25:39 -------------------------------------------------------------------------------- Physical Exam Details Patient Name: Date of Service: Jenetta Downer [name] [name] [facility] [name]. 12/29/2022 11:00 [name]: [id number] Patient Account Number: [id number] Date of Birth/Sex: Treating RN: 1950-11-22 (46 y.Darren. M) Primary Care Provider: Marlowe [name] Other Clinician: Referring Provider: Treating Provider/Extender: [name] Ngetich, [name] in Treatment: 23 Constitutional respirations regular, non-labored and within target range for patient.. Cardiovascular 2+ dorsalis pedis/posterior tibialis pulses. Psychiatric pleasant and cooperative. Notes Right lower extremity: T the posterior aspect there is an open wound with granulation tissue and nonviable tissue. Venous stasis dermatitis. T the left lower Darren Darren extremity there is An open wound With granulation tissue. Does not probe to bone. No surrounding signs of soft tissue infection. Electronic Signature(s) Signed: 12/29/2022 12:37:22 PM By: Darren Shan DO Entered By: [name] on 12/29/2022 12:27:34 -------------------------------------------------------------------------------- Physician Orders Details Patient Name: Date of Service: [name] [facility] [name]. 12/29/2022 11:00 [name]: [id number] Patient Account Number: [id number] Date of Birth/Sex: Treating RN: 1950-12-28 (46 y.Darren. Mare Ferrari Primary Care Provider: Marlowe [name] Other Clinician: Referring  Provider: Treating Provider/Extender: [name] Ngetich, [name] in Treatment: 2 Verbal / Phone Orders: No Diagnosis Coding Follow-up Appointments ppointment in 1 week. - Dr. Heber [place] Tuesday 1015 01/05/2023 keep appt time. after hyperbarics will add you into clinic. Return [name] ppointment in 2 [name]. - Dr. Heber [place] Tuesday after hyperbarics will add you into clinic. Return [name] Other: - NO LOTION due  to hyperbarics. Anesthetic (In clinic) Topical Lidocaine 5% applied to wound bed Cellular or Tissue Based Products Wound #1 Right Achilles Cellular or Tissue Based Product Type: - Grafix approved-waiting to hear about copay or coinsurance-cost for you out of your pocket. Kerecsis denied by insurance. Grafix #1 applied on 09/24/22 Grafix #2 applied on 09/29/2022 Grafix # 3 applied on 10/07/22 Grafix # 4 applied on 10/19/22 Grafix # 5 applied 10/26/2022 Grafix applied to right achilles wound 11/03/2022 Grafix pending insurance approval 11/24/2022 Grafix approved from now to 02/14/2023 total 12. #7 applied. Grafix # 8 applied 12/01/22 Grafix # 9 applied 12/08/22 Grafix #10 applied to 12/15/2022 Grafix #11 applied to 12/22/2022 Grafix #12 applied to 12/29/22 Cellular or Tissue Based Product applied to wound bed, secured with steri-strips, cover with Adaptic or Mepitel. (DO NOT REMOVE). Edema Control - Lymphedema / SCD / Other Avoid standing for long periods of time. Off-Loading BARRETT, WORLDS [name] [id number] Page 5 of 12 Open toe surgical shoe to: - ****put PegAssist in surgical shoe.**** Prevalon Boot - left foot while resting in bed at night. Other: - minimize walking and standing to aid in offloading pressure to wound. Hyperbaric Oxygen Therapy Wound #2 Left,Plantar Foot Evaluate for HBO Therapy Indication: - Wagner grade 3 If appropriate for treatment, begin HBOT per protocol: 2.0 ATA for 90 Minutes with 2 Five (5) Minute [name]  Breaks ir Total Number of Treatments: - 40 One treatments per day (delivered Monday through Friday unless otherwise specified in Special Instructions below): Finger stick Blood Glucose Pre- and Post- HBOT Treatment. Follow Hyperbaric Oxygen Glycemia Protocol [name] frin (Oxymetazoline HCL) 0.05% nasal spray - 1 spray in both nostrils daily as needed prior to HBO treatment for difficulty clearing ears Wound Treatment Wound #1 - Achilles Wound Laterality: Right Cleanser: Soap and Water 1 x Per Week/30 Days Discharge Instructions: May shower and wash wound with dial antibacterial soap and water prior to dressing change. Cleanser: Wound Cleanser 1 x Per Week/30 Days Discharge Instructions: Cleanse the wound with wound cleanser prior to applying [name] clean dressing using gauze sponges, not tissue or cotton balls. Prim Dressing: Grafix 11 1 x Per Week/30 Days ary Discharge Instructions: applied by provider. Prim Dressing: adpatic and steri-strips 1 x Per Week/30 Days ary Discharge Instructions: to secure grafix in place. do not remove. Secondary Dressing: ABD Pad, 5x9 1 x Per Week/30 Days Discharge Instructions: Apply over primary dressing as directed. Secondary Dressing: Woven Gauze Sponge, Non-Sterile 4x4 in 1 x Per Week/30 Days Discharge Instructions: Apply over primary dressing as directed. Compression Wrap: Kerlix Roll 4.5x3.1 (in/yd) 1 x Per Week/30 Days Discharge Instructions: Apply Kerlix and Coban compression as directed. Compression Wrap: Coban Self-Adherent Wrap 4x5 (in/yd) 1 x Per Week/30 Days Discharge Instructions: Apply over Kerlix as directed. Wound #2 - Foot Wound Laterality: Plantar, Left Cleanser: Wound Cleanser 1 x Per Day/30 Days Discharge Instructions: Cleanse the wound with wound cleanser prior to applying [name] clean dressing using gauze sponges, not tissue or cotton balls. Peri-Wound Care: Skin Prep (Generic) 1 x Per Day/30 Days Discharge Instructions: Use skin prep as  directed Prim Dressing: Dakin's Solution 0.25%, 16 (oz) 1 x Per Day/30 Days ary Discharge Instructions: Moisten gauze with Dakin's solution Secondary Dressing: Optifoam Non-Adhesive Dressing, 4x4 in (Generic) 1 x Per Day/30 Days Discharge Instructions: Apply over primary dressing as directed. Secondary Dressing: Woven Gauze Sponge, Non-Sterile 4x4 in (Generic) 1 x Per Day/30 Days Discharge Instructions: Apply over primary dressing as directed. Secondary Dressing: Woven Gauze Sponges  2x2 in (Generic) 1 x Per Day/30 Days Discharge Instructions: Apply over primary dressing as directed. Secured With: Child psychotherapist, Sterile 2x75 (in/in) (Generic) 1 x Per Day/30 Days Discharge Instructions: Secure with stretch gauze as directed. Secured With: 57M Medipore H Soft Cloth Surgical T ape, 4 x 10 (in/yd) (Generic) 1 x Per Day/30 Days Discharge Instructions: Secure with tape as directed. GLYCEMIA INTERVENTIONS PROTOCOL PRE-HBO GLYCEMIA INTERVENTIONS ACTION INTERVENTION Obtain pre-HBO capillary blood glucose (ensure 1 physician order is in chart). [name]. Notify HBO physician and await physician orders. 2 If result is 70 mg/dl or below: B. If the result meets the hospital definition of [name] critical result, follow hospital policy. [name]. Give patient an [address], an GURVIR, THANE [name] [id number] Page 6 of 12 8 ounce Ensure, or 8 ounces of [name] Glucerna/Ensure equivalent dietary supplement*. B. Wait 30 minutes. If result is 71 mg/dl to 130 mg/dl: C. Retest patients capillary blood glucose (CBG). D. If result greater than or equal to 110 mg/dl, proceed with HBO. If result less than 110 mg/dl, notify HBO physician and consider holding HBO. If result is 131 mg/dl to 249 mg/dl: [name]. Proceed with HBO. [name]. Notify HBO physician and await physician orders. B. It is recommended to hold HBO and do If result is 250 mg/dl or  greater: blood/urine ketone testing. C. If the result meets the hospital definition of [name] critical result, follow hospital policy. POST-HBO GLYCEMIA INTERVENTIONS ACTION INTERVENTION Obtain post HBO capillary blood glucose (ensure 1 physician order is in chart). [name]. Notify HBO physician and await physician orders. 2 If result is 70 mg/dl or below: B. If the result meets the hospital definition of [name] critical result, follow hospital policy. [name]. Give patient an 8 ounce Glucerna Shake, an 8 ounce Ensure, or 8 ounces of [name] Glucerna/Ensure equivalent dietary supplement*. B. Wait 15 minutes for symptoms of If result is 71 mg/dl to 100 mg/dl: hypoglycemia (i.e. nervousness, anxiety, sweating, chills, clamminess, irritability, confusion, tachycardia or dizziness). C. If patient asymptomatic, discharge patient. If patient symptomatic, repeat capillary blood glucose (CBG) and notify HBO physician. If result is 101 mg/dl to 249 mg/dl: [name]. Discharge patient. [name]. Notify HBO physician and await physician orders. B. It is recommended to do blood/urine ketone If result is 250 mg/dl or greater: testing. C. If the result meets the hospital definition of [name] critical result, follow hospital policy. *Juice or candies are NOT equivalent products. If patient refuses the Glucerna or Ensure, please consult the hospital dietitian for an appropriate substitute. Electronic Signature(s) Signed: 12/29/2022 12:37:22 PM By: Darren Shan DO Entered By: [name] on 12/29/2022 12:27:43 -------------------------------------------------------------------------------- Problem List Details Patient Name: Date of Service: [name] [facility] [name]. 12/29/2022 11:00 [name]: [id number] Patient Account Number: [id number] Date of Birth/Sex: Treating RN: [birth year] (1 y.Darren. M) Primary Care Provider: Marlowe [name] Other Clinician: Referring Provider: Treating Provider/Extender: [name], [name] in Treatment: 23 Active Problems ICD-10 Encounter Code Description Active Date MDM Diagnosis L97.518 Non-pressure chronic ulcer of other part of right foot with other specified 07/16/2022 No Yes severity L97.522 Non-pressure chronic ulcer of other part of left foot with fat layer exposed 07/16/2022 No Yes E11.621 Type 2 diabetes mellitus with foot ulcer 07/16/2022 No Yes I73.9 Peripheral vascular disease, unspecified 07/16/2022 No Yes [name], [name] [phone].pdf Page 7 of 12 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 07/23/2022 No Yes muscle M86.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 12/29/2022 11:00 [name]: [id number] Patient Account Number: [id number] Date of Birth/Sex: Treating RN: [birth year] (29 y.Darren. M) Primary Care Provider: Marlowe [name] Other Clinician: Referring Provider: Treating Provider/Extender: [name], [name] in Treatment: 65 Subjective Chief Complaint Information obtained from Patient 07/16/2022; bilateral lower extremity wounds History of Present Illness (HPI) Admission 07/16/2022 Mr. Jiovanny Carmona is [name] of insulin-dependent currently controlled type 2 diabetes complicated by peripheral neuropathy, chronic systolic heart failure, obstructive sleep apnea, and peripheral vascular disease that presents to the clinic for [name] several month history of nonhealing ulcer to the back of the right leg and [name] 72-monthhistory of nonhealing  wounds to the left foot. He is not sure how the wounds started. He has been following with podiatry for this issue. He had removal of the tendon to the right lower extremity by [name] He has been using Betadine to the wound beds. On 06/11/2022 he had [name] right posterior tibial artery angioplasty by [name] It was reported the patient is optimized after revascularization of the right lower extremity. His ABIs on the left were 0.91. He currently denies systemic signs of infection. He also does not wear shoes. He came in with Kerlix wrap to his feet bilaterally. This did not completely cover his feet. 07/23/2022: This is [name] patient of Dr. HJodene Namthat she asked me to take [name] look at last week due to the significant involvement of the muscle and tendon on his right posterior leg wound. He needed an aggressive debridement and asked me if I would be able to perform this on her behalf. The patient is here today for that procedure. When his dressing was removed in clinic today, the wound was teeming with maggots. There is necrotic muscle, tendon, and fat, with [name] thick layer of slough on the wound. 9/21; patient presents for follow-up. He was debrided by Dr. CCeline Ahrat last clinic visit without any issues. He has been placing Dakin's wet-to-dry dressings to the right posterior wound. He has been using Medihoney to the left foot wound. He reports improvement in wound healing. He has no issues or complaints today. 9/28; patient with type 2 diabetes PAD status post revascularization. He underwent retrograde right PTA angioplasty. Last saw Dr. CCarlis Abbotton 9/12 and was felt to have [name] brisk posterior tibial signal at the right ankle. He had [name] pulsatile toe tracing that appeared adequate for healing. He has been using Santyl and Hydrofera Blue on the right Achilles area. oo He has [name] smaller area on the left fifth plantar metatarsal head They were apparently in the ER on 9/23 with swelling and discoloration above the  wrap. They have [name] picture of the leg after the wrap was taken off which looks like that it was excessively tight superiorly 10/6; patient presents for follow-up. We have been using Santyl and Hydrofera Blue to the right lower extremity wound under Kerlix/Coban. T the left foot we Darren have been using silver alginate with Medihoney. He again comes in with no shoes. He has no issues or complaints today. 10/12; patient presents for follow-up. We have been using Santyl and Hydrofera Blue to the right lower extremity under Kerlix/Coban And silver  alginate to the left foot wound. He has no issues today. He denies signs of infection. 10/19; patient presents for follow-up. We continue to use Santyl and Hydrofera Blue to the right lower extremity under Kerlix/Coban and silver alginate to the left foot wound. There is been improvement in wound healing. 10/26; patient presents for follow-up. We have been using Santyl and Hydrofera Blue to the right lower extremity under Kerlix/Coban and silver alginate to the left foot. There continues to be improvement in wound healing. Patient has no issues or complaints today. 11/2; the patient's area on the right Achilles heel looks quite healthy and is improved in terms of measurements. We have been using Hydrofera Blue. He is approved for Grafix On the left plantar foot wound over the fifth metatarsal head this tunnels over the lateral part of the met head. He is not offloading this at all [name], [name] ([id number]) [id number] Page 8 of 12 11/16; patient presents for follow-up. He has been using [name] surgical shoe with an offloading felt pad to the left lateral foot along with silver alginate. He has been approved for Grafix and this is available for placement today T the right lower extremity wound. Patient Is agreeable to this. We have been using Santyl and Darren Hydrofera Blue under Kerlix/Coban to this area. 11/21; patient presents for  follow-up. We have been doing Grafix to the right lower extremity and silver alginate to the left foot wound. He has no issues or complaints today. 11/30; patient presents for follow-up. We have been doing Grafix to the right lower extremity wound and Medihoney with Hydrofera Blue to the left lateral foot wound. He reports pain to the left foot wound today. He did not obtain his x-ray. 12/11; patient presents for follow-up. He has been using Dakin's wet-to-dry dressings to the left plantar foot wound. We have been doing Grafix to the right lower extremity wound under compression therapy. He obtained his x-ray that showed mild periosteal elevation at the resection site with the possibility of osteomyelitis. He currently denies systemic signs of infection. He has been taking doxycycline and Augmentin without issues. He is getting his INR checked by his primary care office. 12/18; patient presents for follow-up. He has been using Dakin's wet-to-dry dressings to the left foot wound. We have been placing Grafix under compression therapy to the right lower extremity. He is currently taking doxycycline and Augmentin. He denies signs of infection. 12/26; patient presents for follow-up. He has been using Dakin's wet-to-dry dressings to the left foot. He has been off oral antibiotics for potential bone biopsy. We have been placing Grafix under compression therapy to the right lower extremity. There has been improvement in wound healing to both sites. He states he is trying to aggressively offload the left foot. He currently denies signs of infection. 1/2; patient presents for follow-up. He has been doing Dakin's wet-to-dry dressings to the left foot. He saw Dr. Blenda [name] today and had [name] bone biopsy. Debridement was performed on the site. Dressing in place with Dakin's wet-to-dry. T the right leg we have been using Grafix under compression therapy. He Darren has no issues or complaints today. 1/9; patient presents for  follow-up. We have been using Hydrofera Blue under compression therapy to the right lower extremity wound. This is well-healing. He has been using Dakin's wet-to-dry dressings to the left lateral foot wound. He has been taking ciprofloxacin due to culture results without issues. He was referred to infectious disease by podiatry. He does not have  an appointment yet. 1/16; patient presents for follow-up. We were using collagen under Kerlix/Coban to the right lower extremity wound. He has been approved for Grafix and this was available for placement today. He continues to use Dakin's wet-to-dry dressings daily to the left foot. He started levofloxacin. He has an appointment with infectious disease on 1/24. 1/23; patient presents for follow-up. Grafix was placed in standard fashion to the right lower extremity wound last week. He has been using Dakin's wet-to-dry dressings to the left foot. He has been taking levofloxacin. He has an infectious disease appointment tomorrow as well as [name] pulmonology appointment. He followed up with vein and vascular on 1/16 and plan is for angiogram of the left lower extremity. He has [name] history of left peroneal angioplasty in 2022. 1/30; patient presents for follow-up. Grafix was placed in standard fashion to the right lower extremity wound last week. There has been improvement in healing here. He has been using Dakin's wet-to-dry dressings to the left foot. Patient saw infectious disease, Dr. Juleen China on 1/24 and plan is for PICC line on 2/5 with cefepime. For now he is taking levofloxacin. He has been cleared by pulmonology for HBO. Due to extensive heart history we have asked HBO clearance from his cardiologist as well. He has follow-up with them tomorrow. He currently has no issues or complaints today. 2/6; patient presents for follow-up. He started HBO therapy today and had no issues with his dive. He had an arteriogram on 2/1 by Dr. Carlis [name]. He had angioplasty of the left  peroneal artery. He is optimized from [name] vascular standpoint. We have been using Grafix to the right lower extremity wound and Dakin's wet-to-dry dressings to the left foot wound. He started his IV antibiotics yesterday. 2/13; patient presents for follow-up. We have been using Grafix to the right lower extremity wound under compression therapy and Dakin's wet-to-dry dressings to the left lateral wound. He is continued IV antibiotics without issues. 2/20; Patient presents for follow-up. We have been using Grafix to the right lower extremity wound under compression and Dakin's wet-to-dry dressings to the left lateral foot wound. Per patient since he cannot do two courses of IV antibiotics daily plan is to switch to oral antibiotics. Patient has not done HBO since 2/15. Plan is to be evaluated by ophthalmology to assure there are no issues with his vision. Patient History Information obtained from Chart. Family History Diabetes - Mother, Heart Disease - Mother,Siblings. Social History Current every day smoker, Alcohol Use - Never, Drug Use - No History, Caffeine Use - Never. Medical History Respiratory Patient has history of Sleep Apnea - does not wear Cardiovascular Patient has history of Arrhythmia - [name]. Fib, Congestive Heart Failure - EF 25%, Hypertension, Peripheral Arterial Disease Denies history of Peripheral Venous Disease Endocrine Patient has history of Type II Diabetes Hospitalization/Surgery History - Angiogram 06/11/2022 Dr. Carlis [name] [facility]. - cardioversion 11/06/2021, 2015, 2017. 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 Medical [name] Surgical History Notes nd Gastrointestinal Ascites Genitourinary stage III Kidney disease [name], [name] [id number] Page 9 of 12 Objective Constitutional respirations regular, non-labored and within target range for patient.. Vitals Time Taken: 11:17 AM, Height: 68 in, Weight: 185 lbs, BMI: 28.1, Temperature: 97.7  F, Pulse: 102 bpm, Respiratory Rate: 18 breaths/min, Blood Pressure: 144/85 mmHg. Cardiovascular 2+ dorsalis pedis/posterior tibialis pulses. Psychiatric pleasant and cooperative. General Notes: Right lower extremity: T the posterior aspect there is an open wound with granulation tissue and nonviable tissue. Venous stasis dermatitis. T Darren  Darren the left lower extremity there is An open wound With granulation tissue. Does not probe to bone. No surrounding signs of soft tissue infection. Integumentary (Hair, Skin) Wound #1 status is Open. Original cause of wound was Surgical Injury. The date acquired was: 07/16/2022. The wound has been in treatment 23 [name]. The wound is located on the Right Achilles. The wound measures 2cm length x 0.2cm width x 0.1cm depth; 0.314cm^2 area and 0.031cm^3 volume. There is Fat Layer (Subcutaneous Tissue) exposed. There is [name] none present amount of drainage noted. The wound margin is distinct with the outline attached to the wound base. There is no granulation within the wound bed. There is [name] large (67-100%) amount of necrotic tissue within the wound bed including Eschar. The periwound skin appearance exhibited: Dry/Scaly. The periwound skin appearance did not exhibit: Callus, Crepitus, Excoriation, Induration, Rash, Scarring, Maceration, Atrophie Blanche, Cyanosis, Ecchymosis, Hemosiderin Staining, Mottled, Pallor, Rubor, Erythema. Wound #2 status is Open. Original cause of wound was Gradually Appeared. The date acquired was: 03/09/2022. The wound has been in treatment 23 [name]. The wound is located on the [place]. The wound measures 0.3cm length x 0.1cm width x 0.2cm depth; 0.024cm^2 area and 0.005cm^3 volume. There is bone and Fat Layer (Subcutaneous Tissue) exposed. There is no tunneling or undermining noted. There is [name] medium amount of serosanguineous drainage noted. The wound margin is distinct with the outline attached to the wound base. There is large  (67-100%) pink, friable granulation within the wound bed. There is [name] small (1-33%) amount of necrotic tissue within the wound bed including Adherent Slough. The periwound skin appearance exhibited: Callus. The periwound skin appearance did not exhibit: Crepitus, Excoriation, Induration, Rash, Scarring, Dry/Scaly, Maceration, Atrophie Blanche, Cyanosis, Ecchymosis, Hemosiderin Staining, Mottled, Pallor, Rubor, Erythema. 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 I debrided nonviable tissue to the right lower extremity and Grafix was applied in standard fashion. Continue compression therapy. I recommended continuing Dakin's wet-to-dry dressings to the left foot wound. This has done well with IV antibiotics. The bone is covered here. Per Patient, he has [name] history of vision loss due to cataract surgery in his left eye. Patient is concerned about his right eye vision. At this time I recommended stopping HBO therapy and being cleared by ophthalmology to continue. I recommended he call to make the appointment as he has established care with [facility] ophthalmology. Procedures Wound #1 Pre-procedure diagnosis of Wound #1 is [name] Diabetic Wound/Ulcer of the Lower Extremity located on the Right Achilles .Severity of Tissue Pre Debridement is: Fat layer exposed. There was [name] Selective/Open Wound Skin/Epidermis Debridement with [name] total area of 0.4 sq cm performed by Darren Shan, DO. With the following instrument(s): Curette to remove Non-Viable tissue/material. Material removed includes Slough, Skin:  Dermis, and Skin: Epidermis. No specimens were taken. [name] time out was conducted at 11:40, prior to the start of the procedure. [name] Minimum amount of bleeding was controlled with Pressure. The procedure was tolerated well with [name] pain level of 0 throughout and [name] pain level of 0 following the procedure. Post Debridement Measurements: 2cm length x 0.2cm width x 0.1cm depth; 0.031cm^3 volume. Character of  Wound/Ulcer Post Debridement is improved. Severity of Tissue Post Debridement is: Fat layer exposed. Post procedure Diagnosis Wound #1: Same as Pre-Procedure General Notes: Scribed for Dr Heber Mosier by Sharyn Creamer, Rn. Pre-procedure diagnosis of Wound #1 is [name] Diabetic Wound/Ulcer of the Lower Extremity located on the Right Achilles. [name] skin graft procedure using [name] bioengineered skin substitute/cellular or tissue based product was performed by Darren Shan, DO with the following instrument(s): Forceps. Grafix prime was applied and secured with Steri-Strips. 1.6 sq cm of product was utilized and 0 sq cm was wasted. Post Application, adaptic was applied. [name] Time Out was conducted at 11:50, prior to the start of the procedure. The procedure was tolerated well with [name] pain level of 0 throughout and [name] pain level of 0 following the procedure. Post procedure Diagnosis Wound #1: Same as Pre-Procedure . Plan [name], [name] [id number] Page 10 of 12 Follow-up Appointments: Return Appointment in 1 week. - Dr. Heber Haines Tuesday 1015 01/05/2023 keep appt time. after hyperbarics will add you into clinic. Return Appointment in 2 [name]. - Dr. Heber Melissa Tuesday after hyperbarics will add you into clinic. Other: - NO LOTION due to hyperbarics. Anesthetic: (In clinic) Topical Lidocaine 5% applied to wound bed Cellular or Tissue Based Products: Wound #1 Right Achilles: Cellular or Tissue Based Product Type: - Grafix approved-waiting to hear about copay or coinsurance-cost for you  out of your pocket. Kerecsis denied by insurance. Grafix #1 applied on 09/24/22 Grafix #2 applied on 09/29/2022 Grafix # 3 applied on 10/07/22 Grafix # 4 applied on 10/19/22 Grafix # 5 applied 10/26/2022 Grafix applied to right achilles wound 11/03/2022 Grafix pending insurance approval 11/24/2022 Grafix approved from now to 02/14/2023 total 12. #7 applied. Grafix # 8 applied 12/01/22 Grafix # 9 applied 12/08/22 Grafix #10 applied to 12/15/2022 Grafix #11 applied to 12/22/2022 Grafix #12 applied to 12/29/22 Cellular or Tissue Based Product applied to wound bed, secured with steri-strips, cover with Adaptic or Mepitel. (DO NOT REMOVE). Edema Control - Lymphedema / SCD / Other: Avoid standing for long periods of time. Off-Loading: Open toe surgical shoe to: - ****put PegAssist in surgical shoe.**** Prevalon Boot - left foot while resting in bed at night. Other: - minimize walking and standing to aid in offloading pressure to wound. Hyperbaric Oxygen Therapy: Wound #2 Left,Plantar Foot: Evaluate for HBO Therapy Indication: - Wagner grade 3 If appropriate for treatment, begin HBOT per protocol: 2.0 ATA for 90 Minutes with 2 Five (5) Minute Air Breaks T Number of Treatments: - 40 otal One treatments per day (delivered Monday through Friday unless otherwise specified in Special Instructions below): Finger stick Blood Glucose Pre- and Post- HBOT Treatment. Follow Hyperbaric Oxygen Glycemia Protocol Afrin (Oxymetazoline HCL) 0.05% nasal spray - 1 spray in both nostrils daily as needed prior to HBO treatment for difficulty clearing ears WOUND #1: - Achilles Wound Laterality: Right Cleanser: Soap and Water 1 x Per Week/30 Days Discharge Instructions: May shower and wash wound with dial antibacterial soap and water prior to dressing change. Cleanser: Wound Cleanser 1 x Per Week/30 Days Discharge Instructions: Cleanse the wound with wound cleanser prior to applying [name] clean dressing using gauze sponges, not  tissue or cotton balls. Prim Dressing: Grafix 11 1 x Per Week/30 Days ary Discharge Instructions: applied by provider. Prim Dressing: adpatic and steri-strips 1 x Per Week/30 Days ary Discharge Instructions: to secure grafix in place. do not remove. Secondary Dressing: ABD Pad, 5x9 1 x Per Week/30 Days Discharge Instructions: Apply over primary  dressing as directed. Secondary Dressing: Woven Gauze Sponge, Non-Sterile 4x4 in 1 x Per Week/30 Days Discharge Instructions: Apply over primary dressing as directed. Com pression Wrap: Kerlix Roll 4.5x3.1 (in/yd) 1 x Per Week/30 Days Discharge Instructions: Apply Kerlix and Coban compression as directed. Com pression Wrap: Coban Self-Adherent Wrap 4x5 (in/yd) 1 x Per Week/30 Days Discharge Instructions: Apply over Kerlix as directed. WOUND #2: - Foot Wound Laterality: Plantar, Left Cleanser: Wound Cleanser 1 x Per Day/30 Days Discharge Instructions: Cleanse the wound with wound cleanser prior to applying [name] clean dressing using gauze sponges, not tissue or cotton balls. Peri-Wound Care: Skin Prep (Generic) 1 x Per Day/30 Days Discharge Instructions: Use skin prep as directed Prim Dressing: Dakin's Solution 0.25%, 16 (oz) 1 x Per Day/30 Days ary Discharge Instructions: Moisten gauze with Dakin's solution Secondary Dressing: Optifoam Non-Adhesive Dressing, 4x4 in (Generic) 1 x Per Day/30 Days Discharge Instructions: Apply over primary dressing as directed. Secondary Dressing: Woven Gauze Sponge, Non-Sterile 4x4 in (Generic) 1 x Per Day/30 Days Discharge Instructions: Apply over primary dressing as directed. Secondary Dressing: Woven Gauze Sponges 2x2 in (Generic) 1 x Per Day/30 Days Discharge Instructions: Apply over primary dressing as directed. Secured With: Child psychotherapist, Sterile 2x75 (in/in) (Generic) 1 x Per Day/30 Days Discharge Instructions: Secure with stretch gauze as directed. Secured With: 75M Medipore H Soft Cloth  Surgical T ape, 4 x 10 (in/yd) (Generic) 1 x Per Day/30 Days Discharge Instructions: Secure with tape as directed. 1. In office sharp debridement 2. Grafix placed in standard fashion 3. Kerlix/Cobanooright lower extremity 4. Dakin's wet-to-dry dressingsooleft foot 5. Aggressive offloadingoosurgical shoe with peg assist 6. Follow-up with ophthalmology 6. Continue antibiotics per ID Electronic Signature(s) Signed: 01/04/2023 4:58:37 PM By: Deon Pilling RN, BSN Signed: 01/12/2023 10:54:50 AM By: Darren Shan DO Previous Signature: 12/29/2022 12:37:22 PM Version By: Darren Shan DO Entered By: Deon Pilling on 01/04/2023 14:25:11 HxROS Details -------------------------------------------------------------------------------- [name] ([id number]) [id number] Page 11 of 12 Patient Name: Date of Service: [name] [name] [facility] [name]. 12/29/2022 11:00 [name]: [id number] Patient Account Number: [id number] Date of Birth/Sex: Treating RN: 04/29/1951 (13 y.Darren. M) Primary Care Provider: Marlowe [name] Other Clinician: Referring Provider: Treating Provider/Extender: [name] Ngetich, [name] in Treatment: 23 Information Obtained From Chart Respiratory Medical History: Positive for: Sleep Apnea - does not wear Cardiovascular Medical History: Positive for: Arrhythmia - [name]. Fib; Congestive Heart Failure - EF 25%; Hypertension; Peripheral Arterial Disease Negative for: Peripheral Venous Disease Gastrointestinal Medical History: Past Medical History Notes: Ascites Endocrine Medical History: Positive for: Type II Diabetes Time with diabetes: 6 years Treated with: Insulin Blood sugar tested every day: No Genitourinary Medical History: Past Medical History Notes: stage III Kidney disease Immunizations Pneumococcal Vaccine: Received Pneumococcal Vaccination: No Implantable Devices No devices added Hospitalization /  Surgery History Type of Hospitalization/Surgery Angiogram 06/11/2022 Dr. 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 12/29/2022 Medical Record Number: [id number] Patient Account Number: [id number] Date of Birth/Sex: Treating RN: [birth year] (2 y.Darren. 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, left ankle and foot Facility Procedures : CPT4 Code: [id number] Description: [id number] - SKIN SUB GRAFT TRNK/ARM/LEG ICD-10 Diagnosis Description 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 Modifier: uscle Quantity: 1 : CPT4 Code: [id number] Description: [id number]- Grafix  PL 16 mm disc (2 units) ICD-10 Diagnosis Description 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 m I73.9 Peripheral vascular disease, unspecified Modifier: uscle Quantity: 2 Physician Procedures : CPT4 Code Description Modifier [id number] - WC PHYS LEVEL 3 - EST PT ICD-10 Diagnosis Description 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

## 2023-01-01 NOTE — Progress Notes (Signed)
AMEDIO, MARTINES A ([id number]) [id number] Page 1 of 9 Visit Report for 12/29/2022 Arrival Information Details Patient Name: Date of Service: [name], Darren A. 12/29/2022 11:00 A M Medical Record Number: [id number] Patient Account Number: [id number] Date of Birth/Sex: Treating RN: 29-Nov-1950 (72 y.o. M) Primary Care Darey Hershberger: [name] Other Clinician: Referring Saqib Cazarez: Treating Aadvik Roker/Extender: [name] Ngetich, [name] in Treatment: 23 Visit Information History Since Last Visit Added or deleted any medications: No Patient Arrived: Cane Any new allergies or adverse reactions: No Arrival Time: 11:16 Had a fall or experienced change in No Accompanied By: daughter activities of daily living that may affect Transfer Assistance: None risk of falls: Patient Identification Verified: Yes Signs or symptoms of abuse/neglect since last visito No Secondary Verification Process Completed: Yes Hospitalized since last visit: No Patient Requires Transmission-Based Precautions: No Implantable device outside of the clinic excluding No Patient Has Alerts: Yes cellular tissue based products placed in the center Patient Alerts: Patient on Blood Thinner since last visit: Has Dressing in Place as Prescribed: Yes Has Compression in Place as Prescribed: No Pain Present Now: No Notes compression sild down leg Electronic Signature(s) Signed: 12/29/2022 4:38:15 PM By: Erenest Blank Entered By: Erenest Blank on 12/29/2022 11:26:44 -------------------------------------------------------------------------------- Encounter Discharge Information Details Patient Name: Date of Service: [name] [facility] A. 12/29/2022 11:00 A M Medical Record Number: [id number] Patient Account Number: [id number] Date of Birth/Sex: Treating RN: [birth year] (72 y.o. Mare Ferrari Primary Care Goro Wenrick: [name] Other Clinician: Referring Jenilyn Magana: Treating  Shene [name]/Extender: [name] Ngetich, [name] in Treatment: 77 Encounter Discharge Information Items Post Procedure Vitals Discharge Condition: Stable Temperature (F): 97.7 Ambulatory Status: Cane Pulse (bpm): 102 Discharge Destination: Home Respiratory Rate (breaths/min): 18 Transportation: Private Auto Blood Pressure (mmHg): 144/85 Accompanied By: daughter Schedule Follow-up Appointment: Yes Clinical Summary of Care: Patient Declined Electronic Signature(s) Signed: 12/31/2022 1:42:30 PM By: Sharyn Creamer RN, BSN Entered By: Sharyn Creamer on 12/29/2022 15:09:54 Cordie Grice ([id number].pdf Page 2 of 9 -------------------------------------------------------------------------------- Lower Extremity Assessment Details Patient Name: Date of Service: [name] [facility] A. 12/29/2022 11:00 A M Medical Record Number: [id number] Patient Account Number: [id number] Date of Birth/Sex: Treating RN: [birth year] (72 y.o. M) Primary Care [name]: [name] Other Clinician: Referring Diksha Tagliaferro: Treating Braylan Faul/Extender: [name] Ngetich, [name] in Treatment: 23 Edema Assessment Assessed: [Left: No] [Right: No] Edema: [Left: Yes] [Right: Yes] Calf Left: Right: Point of Measurement: 39 cm From Medial Instep 40.5 cm 46.2 cm Ankle Left: Right: Point of Measurement: 8 cm From Medial Instep 26.5 cm 23.2 cm Electronic Signature(s) Signed: 12/29/2022 4:38:15 PM By: Erenest Blank Entered By: Erenest Blank on 12/29/2022 11:27:21 -------------------------------------------------------------------------------- Multi Wound Chart Details Patient Name: Date of Service: [name] [facility] A. 12/29/2022 11:00 A M Medical Record Number: [id number] Patient Account Number: [id number] Date of Birth/Sex: Treating RN: Dec 23, 1950 (72 y.o. M) Primary Care Monte Bronder: [name] Other Clinician: Referring Aadil Sur: Treating  Sole Lengacher/Extender: [name] Ngetich, [name] in Treatment: 23 Vital Signs Height(in): 68 Pulse(bpm): 102 Weight(lbs): 185 Blood Pressure(mmHg): 144/85 Body Mass Index(BMI): 28.1 Temperature(F): 97.7 Respiratory Rate(breaths/min): 18 [1:Photos:] [N/A:N/A] Right Achilles Left, Plantar Foot N/A Wound Location: Surgical Injury Gradually Appeared N/A Wounding Event: Diabetic Wound/Ulcer of the Lower Diabetic Wound/Ulcer of the Lower N/A Primary Etiology: Extremity Extremity Sleep Apnea, Arrhythmia, Congestive Sleep Apnea, Arrhythmia, Congestive N/A Comorbid History: Heart Failure, Hypertension, Peripheral Heart Failure, Hypertension, Peripheral SHADRACK, [name] A ([id number] Page 3 of 9 Arterial Disease, Type II Diabetes  Arterial Disease, Type II Diabetes 07/16/2022 03/09/2022 N/A Date Acquired: 23 23 N/A Weeks of Treatment: Open Open N/A Wound Status: No No N/A Wound Recurrence: 2x0.2x0.1 0.3x0.1x0.2 N/A Measurements L x W x D (cm) 0.314 0.024 N/A A (cm) : rea 0.031 0.005 N/A Volume (cm) : 98.50% 89.10% N/A % Reduction in A rea: 99.70% 92.40% N/A % Reduction in Volume: Grade 2 Grade 3 N/A Classification: None Present Medium N/A Exudate A mount: N/A Serosanguineous N/A Exudate Type: N/A red, brown N/A Exudate Color: Distinct, outline attached Distinct, outline attached N/A Wound Margin: None Present (0%) Large (67-100%) N/A Granulation A mount: N/A Pink, Friable N/A Granulation Quality: Large (67-100%) Small (1-33%) N/A Necrotic A mount: Eschar Adherent Slough N/A Necrotic Tissue: Fat Layer (Subcutaneous Tissue): Yes Fat Layer (Subcutaneous Tissue): Yes N/A Exposed Structures: Fascia: No Bone: Yes Tendon: No Fascia: No Muscle: No Tendon: No Joint: No Muscle: No Bone: No Joint: No Large (67-100%) Small (1-33%) N/A Epithelialization: Debridement - Selective/Open Wound N/A  N/A Debridement: Pre-procedure Verification/Time Out 11:40 N/A N/A Taken: Slough N/A N/A Tissue Debrided: Skin/Epidermis N/A N/A Level: 0.4 N/A N/A Debridement A (sq cm): rea Curette N/A N/A Instrument: Minimum N/A N/A Bleeding: Pressure N/A N/A Hemostasis A chieved: 0 N/A N/A Procedural Pain: 0 N/A N/A Post Procedural Pain: Procedure was tolerated well N/A N/A Debridement Treatment Response: 2x0.2x0.1 N/A N/A Post Debridement Measurements L x W x D (cm) 0.031 N/A N/A Post Debridement Volume: (cm) Excoriation: No Callus: Yes N/A Periwound Skin Texture: Induration: No Excoriation: No Callus: No Induration: No Crepitus: No Crepitus: No Rash: No Rash: No Scarring: No Scarring: No Dry/Scaly: Yes Maceration: No N/A Periwound Skin Moisture: Maceration: No Dry/Scaly: No Atrophie Blanche: No Atrophie Blanche: No N/A Periwound Skin Color: Cyanosis: No Cyanosis: No Ecchymosis: No Ecchymosis: No Erythema: No Erythema: No Hemosiderin Staining: No Hemosiderin Staining: No Mottled: No Mottled: No Pallor: No Pallor: No Rubor: No Rubor: No Debridement N/A N/A Procedures Performed: Treatment Notes Electronic Signature(s) Signed: 12/29/2022 12:37:22 PM By: [name] DO Entered By: [name] on 12/29/2022 12:13:35 -------------------------------------------------------------------------------- Multi-Disciplinary Care Plan Details Patient Name: Date of Service: [name] [name] [facility] A. 12/29/2022 11:00 A M Medical Record Number: [id number] Patient Account Number: [id number] Date of Birth/Sex: Treating RN: 1951/08/14 (72 y.o. Mare Ferrari Primary Care Erman Thum: [name] Other Clinician: Referring Tanaya Dunigan: Treating Gloristine Turrubiates/Extender: [name], [name] in Treatment: [address] [place] A ([id number]) [id number] Page 4 of 9 Active Inactive Nutrition Nursing Diagnoses: Potential for  alteratiion in Nutrition/Potential for imbalanced nutrition Goals: Patient/caregiver agrees to and verbalizes understanding of need to use nutritional supplements and/or vitamins as prescribed Date Initiated: 07/16/2022 Target Resolution Date: 02/05/2023 Goal Status: Active Patient/caregiver will maintain therapeutic glucose control Date Initiated: 07/16/2022 Target Resolution Date: 02/05/2023 Goal Status: Active Interventions: Assess HgA1c results as ordered upon admission and as needed Provide education on nutrition Treatment Activities: Education provided on Nutrition : 10/08/2022 Obtain HgA1c : 07/16/2022 Patient referred to Primary Care Physician for further nutritional evaluation : 07/16/2022 Notes: Pain, Acute or Chronic Nursing Diagnoses: Pain, acute or chronic: actual or potential Potential alteration in comfort, pain Goals: Patient will verbalize adequate pain control and receive pain control interventions during procedures as needed Date Initiated: 07/16/2022 Target Resolution Date: 02/05/2023 Goal Status: Active Interventions: Encourage patient to take pain medications as prescribed Provide education on pain management Reposition patient for comfort Treatment Activities: Administer pain control measures as ordered : 07/16/2022 Notes: Electronic Signature(s) Signed: 12/31/2022 1:42:30 PM By: Sharyn Creamer  RN, BSN Entered By: Sharyn Creamer on 12/29/2022 11:45:15 -------------------------------------------------------------------------------- Pain Assessment Details Patient Name: Date of Service: [place] A. 12/29/2022 11:00 A M Medical Record Number: [id number] Patient Account Number: [id number] Date of Birth/Sex: Treating RN: 1951/05/29 (72 y.o. M) Primary Care Abiageal Blowe: [name] Other Clinician: Referring Ketih Goodie: Treating Brooklynne Pereida/Extender: [name] Ngetich, [name] in Treatment: 23 Active Problems Location of Pain Severity and Description  of Pain Patient Has Paino No Site Locations SHAHAB, BORRE A ([id number]) [id number] Page 5 of 9 Pain Management and Medication Current Pain Management: Electronic Signature(s) Signed: 12/29/2022 4:38:15 PM By: Erenest Blank Entered By: Erenest Blank on 12/29/2022 11:17:40 -------------------------------------------------------------------------------- Patient/Caregiver Education Details Patient Name: Date of Service: Donnamarie Rossetti 2/20/2024andnbsp11:00 A M Medical Record Number: [id number] Patient Account Number: [id number] Date of Birth/Gender: Treating RN: Jun 09, 1951 (72 y.o. Mare Ferrari Primary Care Physician: [name] Other Clinician: Referring Physician: Treating Physician/Extender: [name] in Treatment: 67 Education Assessment Education Provided To: Patient Education Topics Provided Wound/Skin Impairment: Methods: Explain/Verbal Responses: State content correctly [employer]) Signed: 12/31/2022 1:42:30 PM By: Sharyn Creamer RN, BSN Entered By: Sharyn Creamer on 12/29/2022 11:51:10 -------------------------------------------------------------------------------- Wound Assessment Details Patient Name: Date of Service: [name] [facility] A. 12/29/2022 11:00 A M Medical Record Number: [id number] Patient Account Number: [id number] Date of Birth/Sex: Treating RN: 04/04/1951 (72 y.o. M) Primary Care Stefannie Defeo: [name] Other Clinician: Referring Lashe Oliveira: Treating Alvar Malinoski/Extender: Bishoy, Ra[place] A ([id number]) [id number] Page 6 of 9 Weeks in Treatment: 23 Wound Status Wound Number: 1 Primary Diabetic Wound/Ulcer of the Lower Extremity Etiology: Wound Location: Right Achilles Wound Open Wounding Event: Surgical Injury Status: Date Acquired: 07/16/2022 Comorbid Sleep Apnea, Arrhythmia, Congestive Heart Failure,  Hypertension, Weeks Of Treatment: 23 History: Peripheral Arterial Disease, Type II Diabetes Clustered Wound: No Photos Wound Measurements Length: (cm) 2 Width: (cm) 0.2 Depth: (cm) 0.1 Area: (cm) 0.314 Volume: (cm) 0.031 % Reduction in Area: 98.5% % Reduction in Volume: 99.7% Epithelialization: Large (67-100%) Wound Description Classification: Grade 2 Wound Margin: Distinct, outline attached Exudate Amount: None Present Foul Odor After Cleansing: No Slough/Fibrino No Wound Bed Granulation Amount: None Present (0%) Exposed Structure Necrotic Amount: Large (67-100%) Fascia Exposed: No Necrotic Quality: Eschar Fat Layer (Subcutaneous Tissue) Exposed: Yes Tendon Exposed: No Muscle Exposed: No Joint Exposed: No Bone Exposed: No Periwound Skin Texture Texture Color No Abnormalities Noted: No No Abnormalities Noted: No Callus: No Atrophie Blanche: No Crepitus: No Cyanosis: No Excoriation: No Ecchymosis: No Induration: No Erythema: No Rash: No Hemosiderin Staining: No Scarring: No Mottled: No Pallor: No Moisture Rubor: No No Abnormalities Noted: No Dry / Scaly: Yes Maceration: No Treatment Notes Wound #1 (Achilles) Wound Laterality: Right Cleanser Soap and Water Discharge Instruction: May shower and wash wound with dial antibacterial soap and water prior to dressing change. Wound Cleanser Discharge Instruction: Cleanse the wound with wound cleanser prior to applying a clean dressing using gauze sponges, not tissue or cotton balls. Peri-Wound Care Topical OVAL, LITTREL A ([id number]) [id number] Page 7 of 9 Primary Dressing Grafix 11 Discharge Instruction: applied by Blaire Palomino. adpatic and steri-strips Discharge Instruction: to secure grafix in place. do not remove. Secondary Dressing ABD Pad, 5x9 Discharge Instruction: Apply over primary dressing as directed. Woven Gauze Sponge, Non-Sterile 4x4 in Discharge Instruction: Apply  over primary dressing as directed. Secured With Compression Wrap Kerlix Roll 4.5x3.1 (in/yd) Discharge Instruction: Apply Kerlix and Coban compression as directed. Coban Self-Adherent Wrap 4x5 (in/yd) Discharge Instruction:  Apply over Kerlix as directed. Compression Stockings Add-Ons Electronic Signature(s) Signed: 12/29/2022 4:50:49 PM By: Deon Pilling RN, BSN Entered By: Deon Pilling on 12/29/2022 11:36:58 -------------------------------------------------------------------------------- Wound Assessment Details Patient Name: Date of Service: [place], [place] A. 12/29/2022 11:00 A M Medical Record Number: [id number] Patient Account Number: [id number] Date of Birth/Sex: Treating RN: 1950/11/11 (72 y.o. M) Primary Care Bartosz Luginbill: [name] Other Clinician: Referring Renna Kilmer: Treating Yong Grieser/Extender: [name] Ngetich, [name] in Treatment: 23 Wound Status Wound Number: 2 Primary Diabetic Wound/Ulcer of the Lower Extremity Etiology: Wound Location: Left, Plantar Foot Wound Open Wounding Event: Gradually Appeared Status: Date Acquired: 03/09/2022 Comorbid Sleep Apnea, Arrhythmia, Congestive Heart Failure, Hypertension, Weeks Of Treatment: 23 History: Peripheral Arterial Disease, Type II Diabetes Clustered Wound: No Photos Wound Measurements Length: (cm) 0.3 Width: (cm) 0.1 Depth: (cm) 0.2 Area: (cm) 0.024 Volume: (cm) 0.005 Piechocki, Mikah A ([id number]) Wound Description Classification: Grade 3 Wound Margin: Distinct, outline attached Exudate Amount: Medium Exudate Type: Serosanguineous Exudate Color: red, brown Foul Odor After Cleansing: No Slough/Fibrino Yes % Reduction in Area: 89.1% % Reduction in Volume: 92.4% Epithelialization: Large (67-100%) Tunneling: No Undermining: No [id number] Page 8 of 9 Wound Bed Granulation Amount: Large (67-100%) Exposed Structure Granulation Quality: Pink, Friable Fascia  Exposed: No Necrotic Amount: Small (1-33%) Fat Layer (Subcutaneous Tissue) Exposed: Yes Necrotic Quality: Adherent Slough Tendon Exposed: No Muscle Exposed: No Joint Exposed: No Bone Exposed: Yes Periwound Skin Texture Texture Color No Abnormalities Noted: No No Abnormalities Noted: No Callus: Yes Atrophie Blanche: No Crepitus: No Cyanosis: No Excoriation: No Ecchymosis: No Induration: No Erythema: No Rash: No Hemosiderin Staining: No Scarring: No Mottled: No Pallor: No Moisture Rubor: No No Abnormalities Noted: No Dry / Scaly: No Maceration: No Treatment Notes Wound #2 (Foot) Wound Laterality: Plantar, Left Cleanser Wound Cleanser Discharge Instruction: Cleanse the wound with wound cleanser prior to applying a clean dressing using gauze sponges, not tissue or cotton balls. Peri-Wound Care Skin Prep Discharge Instruction: Use skin prep as directed Topical Primary Dressing Dakin's Solution 0.25%, 16 (oz) Discharge Instruction: Moisten gauze with Dakin's solution Secondary Dressing Optifoam Non-Adhesive Dressing, 4x4 in Discharge Instruction: Apply over primary dressing as directed. Woven Gauze Sponge, Non-Sterile 4x4 in Discharge Instruction: Apply over primary dressing as directed. Woven Gauze Sponges 2x2 in Discharge Instruction: Apply over primary dressing as directed. Secured With Conforming Stretch Gauze Bandage, Sterile 2x75 (in/in) Discharge Instruction: Secure with stretch gauze as directed. 110M Medipore H Soft Cloth Surgical T ape, 4 x 10 (in/yd) Discharge Instruction: Secure with tape as directed. Compression Wrap Compression Stockings Add-Ons Electronic Signature(s) Signed: 12/31/2022 1:42:30 PM By: Sharyn Creamer RN, BSN Entered By: Sharyn Creamer on 12/29/2022 15:38:44 Cordie Grice ([id number] Page 9 of 9 -------------------------------------------------------------------------------- Vitals  Details Patient Name: Date of Service: [name] [facility] A. 12/29/2022 11:00 A M Medical Record Number: [id number] Patient Account Number: [id number] Date of Birth/Sex: Treating RN: [birth year] (72 y.o. M) Primary Care Gyneth Hubka: [name] Other Clinician: Referring Zaul Hubers: Treating Karington Zarazua/Extender: [name] Ngetich, [name] in Treatment: 23 Vital Signs Time Taken: 11:17 Temperature (F): 97.7 Height (in): 68 Pulse (bpm): 102 Weight (lbs): 185 Respiratory Rate (breaths/min): 18 Body Mass Index (BMI): 28.1 Blood Pressure (mmHg): 144/85 Reference Range: 80 - 120 mg / dl Electronic Signature(s) Signed: 12/29/2022 4:38:15 PM By: Erenest Blank Entered By: Erenest Blank on 12/29/2022 11:17:35

## 2023-01-04 DIAGNOSIS — M86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, left ankle and foot: Secondary | ICD-10-CM | POA: Diagnosis not present

## 2023-01-04 DIAGNOSIS — I5022 Chronic systolic (congestive) heart failure: Secondary | ICD-10-CM | POA: Diagnosis not present

## 2023-01-04 DIAGNOSIS — E1151 Type 2 diabetes mellitus with diabetic peripheral angiopathy without gangrene: Secondary | ICD-10-CM | POA: Diagnosis not present

## 2023-01-04 DIAGNOSIS — L97529 Non-pressure chronic ulcer of other part of left foot with unspecified severity: Secondary | ICD-10-CM | POA: Diagnosis not present

## 2023-01-04 DIAGNOSIS — B9689 Other specified bacterial agents as the cause of diseases classified elsewhere: Secondary | ICD-10-CM | POA: Diagnosis not present

## 2023-01-04 DIAGNOSIS — I110 Hypertensive heart disease with heart failure: Secondary | ICD-10-CM | POA: Diagnosis not present

## 2023-01-04 DIAGNOSIS — E1169 Type 2 diabetes mellitus with other specified complication: Secondary | ICD-10-CM | POA: Diagnosis not present

## 2023-01-04 DIAGNOSIS — B965 Pseudomonas (aeruginosa) (mallei) (pseudomallei) as the cause of diseases classified elsewhere: Secondary | ICD-10-CM | POA: Diagnosis not present

## 2023-01-04 DIAGNOSIS — E11621 Type 2 diabetes mellitus with foot ulcer: Secondary | ICD-10-CM | POA: Diagnosis not present

## 2023-01-04 NOTE — Telephone Encounter (Signed)
Patient's daughter refused to let [facility] remove picc line Friday and Patient's daughter stated patient's hyperbaric appointments have been cancelled and she can give IV abx. Pam with [employer] is calling to know if you would like to extend patient IV abx or still want him to go to oral abx.  Please advise. Nguyet Mercer T Brooks Sailors

## 2023-01-04 NOTE — Telephone Encounter (Signed)
Received a message from [employer] stating after speaking with the patient's daughter today, she would like the patient to have picc line removed. Patient's daughter states she does not have time to infuse patient's IV abx twice daily.   Per Dr. Juleen China patient will need to go to oral abx (Levaquin 750 mg). Patient's daughter reports the patient still has Levaquin in the home to take. Patient daughter informed and advised we will reach out to [employer] to have picc line removed.   I have reached out to th [employer] to to give orders for patient's picc line to be removed.  Sandria Mcenroe T Brooks Sailors

## 2023-01-05 ENCOUNTER — Encounter (HOSPITAL_BASED_OUTPATIENT_CLINIC_OR_DEPARTMENT_OTHER): Payer: Medicare HMO | Admitting: Internal Medicine

## 2023-01-05 DIAGNOSIS — E11621 Type 2 diabetes mellitus with foot ulcer: Secondary | ICD-10-CM | POA: Diagnosis not present

## 2023-01-05 DIAGNOSIS — L97813 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: Secondary | ICD-10-CM

## 2023-01-05 DIAGNOSIS — E1151 Type 2 diabetes mellitus with diabetic peripheral angiopathy without gangrene: Secondary | ICD-10-CM | POA: Diagnosis not present

## 2023-01-05 DIAGNOSIS — M86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, left ankle and foot: Secondary | ICD-10-CM | POA: Diagnosis not present

## 2023-01-05 DIAGNOSIS — L97522 Non-pressure chronic ulcer of other part of left foot with fat layer exposed: Secondary | ICD-10-CM

## 2023-01-05 DIAGNOSIS — Z794 Long term (current) use of insulin: Secondary | ICD-10-CM | POA: Diagnosis not present

## 2023-01-05 DIAGNOSIS — L97518 Non-pressure chronic ulcer of other part of right foot with other specified severity: Secondary | ICD-10-CM | POA: Diagnosis not present

## 2023-01-05 DIAGNOSIS — E1169 Type 2 diabetes mellitus with other specified complication: Secondary | ICD-10-CM | POA: Diagnosis not present

## 2023-01-05 DIAGNOSIS — I110 Hypertensive heart disease with heart failure: Secondary | ICD-10-CM | POA: Diagnosis not present

## 2023-01-06 DIAGNOSIS — I739 Peripheral vascular disease, unspecified: Secondary | ICD-10-CM | POA: Diagnosis not present

## 2023-01-06 DIAGNOSIS — S81801A Unspecified open wound, right lower leg, initial encounter: Secondary | ICD-10-CM | POA: Diagnosis not present

## 2023-01-06 DIAGNOSIS — L97813 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: Secondary | ICD-10-CM | POA: Diagnosis not present

## 2023-01-06 DIAGNOSIS — E11621 Type 2 diabetes mellitus with foot ulcer: Secondary | ICD-10-CM | POA: Diagnosis not present

## 2023-01-06 DIAGNOSIS — L97522 Non-pressure chronic ulcer of other part of left foot with fat layer exposed: Secondary | ICD-10-CM | POA: Diagnosis not present

## 2023-01-07 DIAGNOSIS — H2522 Age-related cataract, morgagnian type, left eye: Secondary | ICD-10-CM | POA: Diagnosis not present

## 2023-01-07 NOTE — Progress Notes (Signed)
[name] [name] ([id number]) [id number] Page 1 of 11 Visit Report for 01/05/2023 Chief Complaint Document Details Patient Name: Date of Service: [name] [facility] [name]. 01/05/2023 10:15 [name]: [id number] Patient Account Number: [id number] Date of Birth/Sex: Treating RN: 22-Nov-1950 (72 y.o. M) Primary Care Provider: [name] Other Clinician: Referring Provider: Treating Provider/Extender: [name], [name] in Treatment: 24 Information Obtained from: Patient Chief Complaint 07/16/2022; bilateral lower extremity wounds Electronic Signature(s) Signed: 01/05/2023 12:34:27 PM By: [name] DO Entered By: [name] on 01/05/2023 12:30:08 -------------------------------------------------------------------------------- Debridement Details Patient Name: Date of Service: [name] [name] 01/05/2023 10:15 [name]: [id number] Patient Account Number: [id number] Date of Birth/Sex: Treating RN: [birth year] (72 y.o. [name] Primary Care Provider: [name] Other Clinician: Referring Provider: Treating Provider/Extender: [name] Ngetich, [name] in Treatment: 24 Debridement Performed for Assessment: Wound #1 Right Achilles Performed By: Physician [name], DO Debridement Type: Debridement Severity of Tissue Pre Debridement: Fat layer exposed Level of Consciousness (Pre-procedure): Awake and Alert Pre-procedure Verification/Time Out Yes - 11:50 Taken: Start Time: 11:51 Pain Control: Lidocaine 4% Topical Solution T Area Debrided (L x W): otal 1 (cm) x 1 (cm) = 1 (cm) Tissue and other material debrided: Non-Viable, Callus, Skin: Dermis , Skin: Epidermis Level: Skin/Epidermis Debridement Description: Selective/Open Wound Instrument: Curette Bleeding: Minimum Hemostasis Achieved: Pressure End Time: 11:54 Procedural Pain: 0 Post Procedural Pain:  0 Response to Treatment: Procedure was tolerated well Level of Consciousness (Post- Awake and Alert procedure): Post Debridement Measurements of Total Wound Length: (cm) 2 Width: (cm) 0.2 Depth: (cm) 0.1 Volume: (cm) 0.031 Character of Wound/Ulcer Post Debridement: Improved Severity of Tissue Post Debridement: Fat layer exposed Post Procedure Diagnosis Same as Pre-procedure Electronic Signature(s) Signed: 01/05/2023 12:29:31 PM By: [name] DO Signed: 01/06/2023 6:07:49 PM By: Deon Pilling RN, BSN Entered By: Deon Pilling on 01/05/2023 11:55:12 [name] ([id number].pdf Page 2 of 11 -------------------------------------------------------------------------------- HPI Details Patient Name: Date of Service: [name] [facility] [name]. 01/05/2023 10:15 [name]: [id number] Patient Account Number: [id number] Date of Birth/Sex: Treating RN: April 28, 1951 (72 y.o. M) Primary Care Provider: [name] Other Clinician: Referring Provider: Treating Provider/Extender: [name], [name] in Treatment: 24 History of Present Illness HPI Description: Admission 07/16/2022 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 He is not sure how the wounds started. He has been following with podiatry for this issue. He had removal of the tendon to the right lower extremity by Dr. SBlenda Mounts He has been using Betadine to the wound beds. On 06/11/2022 he had [name] right posterior tibial artery angioplasty by Dr. CCarlis Abbott It was reported the patient is optimized after  revascularization of the right lower extremity. His ABIs on the left were 0.91. He currently denies systemic signs of infection. He also does not wear shoes. He came in with Kerlix wrap to his feet bilaterally. This did not completely cover his feet. 07/23/2022: This is [name] patient of Dr. HJodene Namthat she asked me to take [name] look at last week due to the significant involvement of the muscle and tendon  on his right posterior leg wound. He needed an aggressive debridement and asked me if I would be able to perform this on her behalf. The patient is here today for that procedure. When his dressing was removed in clinic today, the wound was teeming with maggots. There is necrotic muscle, tendon, and fat, with [name] thick layer of slough on the wound. 9/21; patient presents for follow-up. He was debrided by Dr. Celine Ahr at last clinic visit without any issues. He has been placing Dakin's wet-to-dry dressings to the right posterior wound. He has been using Medihoney to the left foot wound. He reports improvement in wound healing. He has no issues or complaints today. 9/28; patient with type 2 diabetes PAD status post revascularization. He underwent retrograde right PTA angioplasty. Last saw Dr. Carlis Abbott on 9/12 and was felt to have [name] brisk posterior tibial signal at the right ankle. He had [name] pulsatile toe tracing that appeared adequate for healing. He has been using Santyl and Hydrofera Blue on the right Achilles area. He has [name] smaller area on the left fifth plantar metatarsal head They were apparently in the ER on 9/23 with swelling and discoloration above the wrap. They have [name] picture of the leg after the wrap was taken off which looks like that it was excessively tight superiorly 10/6; patient presents for follow-up. We have been using Santyl and Hydrofera Blue to the right lower extremity wound under Kerlix/Coban. T the left foot we o have been using silver alginate with Medihoney. He again comes in with no  shoes. He has no issues or complaints today. 10/12; patient presents for follow-up. We have been using Santyl and Hydrofera Blue to the right lower extremity under Kerlix/Coban And silver alginate to the left foot wound. He has no issues today. He denies signs of infection. 10/19; patient presents for follow-up. We continue to use Santyl and Hydrofera Blue to the right lower extremity under Kerlix/Coban and silver alginate to the left foot wound. There is been improvement in wound healing. 10/26; patient presents for follow-up. We have been using Santyl and Hydrofera Blue to the right lower extremity under Kerlix/Coban and silver alginate to the left foot. There continues to be improvement in wound healing. Patient has no issues or complaints today. 11/2; the patient's area on the right Achilles heel looks quite healthy and is improved in terms of measurements. We have been using Hydrofera Blue. He is approved for Grafix On the left plantar foot wound over the fifth metatarsal head this tunnels over the lateral part of the met head. He is not offloading this at all 11/16; patient presents for follow-up. He has been using [name] surgical shoe with an offloading felt pad to the left lateral foot along with silver alginate. He has been approved for Grafix and this is available for placement today T the right lower extremity wound. Patient Is agreeable to this. We have been using Santyl and o Hydrofera Blue under Kerlix/Coban to this area. 11/21; patient presents for follow-up. We have been doing Grafix to the right lower extremity and silver alginate to the left foot wound. He has no issues or complaints today. 11/30; patient presents for follow-up. We have been doing Grafix to the right lower extremity wound and Medihoney with Hydrofera Blue to the left lateral foot wound. He reports pain to the left foot wound today. He did not obtain his x-ray. 12/11; patient presents for follow-up. He has been using  Dakin's wet-to-dry dressings to the left plantar  foot wound. We have been doing Grafix to the right lower extremity wound under compression therapy. He obtained his x-ray that showed mild periosteal elevation at the resection site with the possibility of osteomyelitis. He currently denies systemic signs of infection. He has been taking doxycycline and Augmentin without issues. He is getting his INR checked by his primary care office. 12/18; patient presents for follow-up. He has been using Dakin's wet-to-dry dressings to the left foot wound. We have been placing Grafix under compression therapy to the right lower extremity. He is currently taking doxycycline and Augmentin. He denies signs of infection. 12/26; patient presents for follow-up. He has been using Dakin's wet-to-dry dressings to the left foot. He has been off oral antibiotics for potential bone biopsy. We have been placing Grafix under compression therapy to the right lower extremity. There has been improvement in wound healing to both sites. He states he is trying to aggressively offload the left foot. He currently denies signs of infection. 1/2; patient presents for follow-up. He has been doing Dakin's wet-to-dry dressings to the left foot. He saw Dr. Blenda Mounts today and had [name] bone biopsy. Debridement was performed on the site. Dressing in place with Dakin's wet-to-dry. T the right leg we have been using Grafix under compression therapy. He o has no issues or complaints today. 1/9; patient presents for follow-up. We have been using Hydrofera Blue under compression therapy to the right lower extremity wound. This is well-healing. He has been using Dakin's wet-to-dry dressings to the left lateral foot wound. He has been taking ciprofloxacin due to culture results without issues. He was referred to infectious disease by podiatry. He does not have an appointment yet. [name], [name] ([id number]) [id number] Page 3  of 11 1/16; patient presents for follow-up. We were using collagen under Kerlix/Coban to the right lower extremity wound. He has been approved for Grafix and this was available for placement today. He continues to use Dakin's wet-to-dry dressings daily to the left foot. He started levofloxacin. He has an appointment with infectious disease on 1/24. 1/23; patient presents for follow-up. Grafix was placed in standard fashion to the right lower extremity wound last week. He has been using Dakin's wet-to-dry dressings to the left foot. He has been taking levofloxacin. He has an infectious disease appointment tomorrow as well as [name] pulmonology appointment. He followed up with vein and vascular on 1/16 and plan is for angiogram of the left lower extremity. He has [name] history of left peroneal angioplasty in 2022. 1/30; patient presents for follow-up. Grafix was placed in standard fashion to the right lower extremity wound last week. There has been improvement in healing here. He has been using Dakin's wet-to-dry dressings to the left foot. Patient saw infectious disease, Dr. Juleen China on 1/24 and plan is for PICC line on 2/5 with cefepime. For now he is taking levofloxacin. He has been cleared by pulmonology for HBO. Due to extensive heart history we have asked HBO clearance from his cardiologist as well. He has follow-up with them tomorrow. He currently has no issues or complaints today. 2/6; patient presents for follow-up. He started HBO therapy today and had no issues with his dive. He had an arteriogram on 2/1 by Dr. Carlis Abbott. He had angioplasty of the left peroneal artery. He is optimized from [name] vascular standpoint. We have been using Grafix to the right lower extremity wound and Dakin's wet-to-dry dressings to the left foot wound. He started his IV antibiotics yesterday. 2/13; patient presents for follow-up.  We have been using Grafix to the right lower extremity wound under compression therapy and Dakin's  wet-to-dry dressings to the left lateral wound. He is continued IV antibiotics without issues. 2/20; Patient presents for follow-up. We have been using Grafix to the right lower extremity wound under compression and Dakin's wet-to-dry dressings to the left lateral foot wound. Per patient since he cannot do two courses of IV antibiotics daily plan is to switch to oral antibiotics. Patient has not done HBO since 2/15. Plan is to be evaluated by ophthalmology to assure there are no issues with his vision. 2/27; patient presents for follow-up. We have been using Grafix to the right lower extremity under compression therapy and Dakin's wet-to-dry dressings to the left lateral foot wound. He has switched over to oral antibiotics for the left foot with chronic osteomyelitis. He is scheduled to see ophthalmology later this week to be cleared for HBO. Electronic Signature(s) Signed: 01/05/2023 12:34:27 PM By: [name] DO Entered By: [name] on 01/05/2023 12:30:50 -------------------------------------------------------------------------------- Physical Exam Details Patient Name: Date of Service: [name] [facility] [name]. 01/05/2023 10:15 [name]: [id number] Patient Account Number: [id number] Date of Birth/Sex: Treating RN: Jul 14, 1951 (72 y.o. M) Primary Care Provider: [name] Other Clinician: Referring Provider: Treating Provider/Extender: [name] Ngetich, [name] in Treatment: 24 Constitutional respirations regular, non-labored and within target range for patient.. Cardiovascular 2+ dorsalis pedis/posterior tibialis pulses. Psychiatric pleasant and cooperative. Notes Right lower extremity: T the posterior aspect there is an open wound with granulation tissue and nonviable tissue. Venous stasis dermatitis. T the left lateral o o foot there are two small open areas that is almost epithelialized. Does not probe to bone. No surrounding signs of soft  tissue infection. Electronic Signature(s) Signed: 01/05/2023 12:34:27 PM By: [name] DO Entered By: [name] on 01/05/2023 12:32:09 -------------------------------------------------------------------------------- Physician Orders Details Patient Name: Date of Service: [name] [name] [facility] [name]. 01/05/2023 10:15 [name]: [id number] Patient Account Number: [id number] Date of Birth/Sex: Treating RN: Aug 05, 1951 (72 y.o. [name] Primary Care Provider: [name] Other Clinician: Referring Provider: Treating Provider/Extender: [name], [name] in Treatment: 7 Verbal / Phone Orders: No Diagnosis Coding ICD-10 Coding Code Description ANTRONE, ZEH [name] ([id number]) [id number] Page 4 of 11 L97.518 Non-pressure chronic ulcer of other part of right foot with other specified severity L97.522 Non-pressure chronic ulcer of other part of left foot with fat layer exposed E11.621 Type 2 diabetes mellitus with foot ulcer I73.9 Peripheral vascular disease, unspecified 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 M86.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, left ankle and foot Follow-up Appointments ppointment in 1 week. - 01/14/2023 0930 Dr. Heber [place] after hyperbarics. Return [name] Other: - NO LOTION due to hyperbarics. ****ensure you go see your eye doctor for clearance of hyperbarics. Anesthetic (In clinic) Topical Lidocaine 5% applied to wound bed Cellular or Tissue Based Products Wound #1 Right Achilles Cellular or Tissue Based Product Type: - Grafix approved-waiting to hear about copay or coinsurance-cost for you out of your pocket. Kerecsis denied by insurance. Grafix #1 applied on 09/24/22 Grafix #2 applied on 09/29/2022 Grafix # 3 applied on 10/07/22 Grafix # 4 applied on 10/19/22 Grafix # 5 applied 10/26/2022 Grafix applied to right achilles wound  11/03/2022 Grafix pending insurance approval 11/24/2022 Grafix approved from now to 02/14/2023 total 12. #7 applied. Grafix # 8 applied 12/01/22 Grafix # 9 applied 12/08/22 Grafix #10 applied to  12/15/2022 Grafix #11 applied to 12/22/2022 Grafix #12 applied to 12/29/22 Edema Control - Lymphedema / SCD / Other Avoid standing for long periods of time. Off-Loading Open toe surgical shoe to: - ****put PegAssist in surgical shoe.**** Prevalon Boot - left foot while resting in bed at night. Other: - minimize walking and standing to aid in offloading pressure to wound. Hyperbaric Oxygen Therapy Wound #2 Left,Plantar Foot Evaluate for HBO Therapy Indication: - Wagner grade 3 If appropriate for treatment, begin HBOT per protocol: 2.0 ATA for 90 Minutes with 2 Five (5) Minute [name] Breaks ir Total Number of Treatments: - 40 One treatments per day (delivered Monday through Friday unless otherwise specified in Special Instructions below): Finger stick Blood Glucose Pre- and Post- HBOT Treatment. Follow Hyperbaric Oxygen Glycemia Protocol [name] frin (Oxymetazoline HCL) 0.05% nasal spray - 1 spray in both nostrils daily as needed prior to HBO treatment for difficulty clearing ears Wound Treatment Wound #1 - Achilles Wound Laterality: Right Cleanser: Soap and Water 1 x Per Week/30 Days Discharge Instructions: May shower and wash wound with dial antibacterial soap and water prior to dressing change. Cleanser: Wound Cleanser 1 x Per Week/30 Days Discharge Instructions: Cleanse the wound with wound cleanser prior to applying [name] clean dressing using gauze sponges, not tissue or cotton balls. Prim Dressing: Hydrofera Blue Ready Transfer Foam, 2.5x2.5 (in/in) 1 x Per Week/30 Days ary Discharge Instructions: Apply directly to wound bed as directed Secondary Dressing: ABD Pad, 5x9 1 x Per Week/30 Days Discharge Instructions: Apply over primary dressing as directed. Secondary Dressing: Woven Gauze Sponge, Non-Sterile  4x4 in 1 x Per Week/30 Days Discharge Instructions: Apply over primary dressing as directed. Compression Wrap: Kerlix Roll 4.5x3.1 (in/yd) 1 x Per Week/30 Days Discharge Instructions: Apply Kerlix and Coban compression as directed. Compression Wrap: Coban Self-Adherent Wrap 4x5 (in/yd) 1 x Per Week/30 Days Discharge Instructions: Apply over Kerlix as directed. Wound #2 - Foot Wound Laterality: Plantar, Left [name], [name] [id number] Page 5 of 11 Cleanser: Wound Cleanser 1 x Per Day/30 Days Discharge Instructions: Cleanse the wound with wound cleanser prior to applying [name] clean dressing using gauze sponges, not tissue or cotton balls. Peri-Wound Care: Skin Prep (DME) (Generic) 1 x Per Day/30 Days Discharge Instructions: Use skin prep as directed Prim Dressing: Dakin's Solution 0.25%, 16 (oz) 1 x Per Day/30 Days ary Discharge Instructions: Moisten gauze with Dakin's solution Secondary Dressing: Optifoam Non-Adhesive Dressing, 4x4 in (DME) (Generic) 1 x Per Day/30 Days Discharge Instructions: Apply over primary dressing as directed. Secondary Dressing: Woven Gauze Sponge, Non-Sterile 4x4 in (DME) (Generic) 1 x Per Day/30 Days Discharge Instructions: Apply over primary dressing as directed. Secondary Dressing: Woven Gauze Sponges 2x2 in (DME) (Generic) 1 x Per Day/30 Days Discharge Instructions: Apply over primary dressing as directed. Secured With: Child psychotherapist, Sterile 2x75 (in/in) (DME) (Generic) 1 x Per Day/30 Days Discharge Instructions: Secure with stretch gauze as directed. Secured With: 42M Medipore Public affairs consultant Surgical T 2x10 (in/yd) (DME) (Generic) 1 x Per Day/30 Days ape Discharge Instructions: Secure with tape as directed. GLYCEMIA INTERVENTIONS PROTOCOL PRE-HBO GLYCEMIA INTERVENTIONS ACTION INTERVENTION Obtain pre-HBO capillary blood glucose (ensure 1 physician order is in chart). [name]. Notify HBO physician and await  physician orders. 2 If result is 70 mg/dl or below: B. If the result meets the hospital definition of [name] critical result, follow hospital policy. [name]. Give patient an 8 ounce Glucerna Shake, an 8 ounce Ensure, or 8 ounces of [name] Glucerna/Ensure equivalent dietary supplement*.  B. Wait 30 minutes. If result is 71 mg/dl to 130 mg/dl: C. Retest patients capillary blood glucose (CBG). D. If result greater than or equal to 110 mg/dl, proceed with HBO. If result less than 110 mg/dl, notify HBO physician and consider holding HBO. If result is 131 mg/dl to 249 mg/dl: [name]. Proceed with HBO. [name]. Notify HBO physician and await physician orders. B. It is recommended to hold HBO and do If result is 250 mg/dl or greater: blood/urine ketone testing. C. If the result meets the hospital definition of [name] critical result, follow hospital policy. POST-HBO GLYCEMIA INTERVENTIONS ACTION INTERVENTION Obtain post HBO capillary blood glucose (ensure 1 physician order is in chart). [name]. Notify HBO physician and await physician orders. 2 If result is 70 mg/dl or below: B. If the result meets the hospital definition of [name] critical result, follow hospital policy. [name]. Give patient an 8 ounce Glucerna Shake, an 8 ounce Ensure, or 8 ounces of [name] Glucerna/Ensure equivalent dietary supplement*. B. Wait 15 minutes for symptoms of If result is 71 mg/dl to 100 mg/dl: hypoglycemia (i.e. nervousness, anxiety, sweating, chills, clamminess, irritability, confusion, tachycardia or dizziness). C. If patient asymptomatic, discharge patient. If patient symptomatic, repeat capillary blood glucose (CBG) and notify HBO physician. If result is 101 mg/dl to 249 mg/dl: [name]. Discharge patient. [name]. Notify HBO physician and await physician orders. B. It is recommended to do blood/urine ketone If result is 250 mg/dl or greater: testing. C. If the result meets the hospital definition of [name] critical result, follow hospital  policy. *Juice or candies are NOT equivalent products. If patient refuses the Glucerna or Ensure, please consult the hospital dietitian for an appropriate substitute. Electronic Signature(s) Signed: 01/05/2023 12:34:27 PM By: Boris Sharper ([id number]) By: [name] DO [phone].pdf Page 6 of 11 Signed: 01/05/2023 12:34:27 PM Previous Signature: 01/05/2023 12:29:31 PM Version By: [name] DO Entered By: [name] on 01/05/2023 12:32:23 -------------------------------------------------------------------------------- Problem List Details Patient Name: Date of Service: [name] [facility] [name]. 01/05/2023 10:15 [name]: [id number] Patient Account Number: [id number] Date of Birth/Sex: Treating RN: 05-17-1951 (72 y.o. [name] Primary Care Provider: [name] Other Clinician: Referring Provider: Treating Provider/Extender: [name], [name] in Treatment: 24 Active Problems ICD-10 Encounter Code Description Active Date MDM Diagnosis L97.518 Non-pressure chronic ulcer of other part of right foot with other specified 07/16/2022 No Yes severity L97.522 Non-pressure chronic ulcer of other part of left foot with fat layer exposed 07/16/2022 No Yes E11.621 Type 2 diabetes mellitus with foot ulcer 07/16/2022 No Yes I73.9 Peripheral vascular disease, unspecified 07/16/2022 No Yes 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 07/23/2022 No Yes muscle M86.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, left ankle and foot 11/10/2022 No Yes Inactive Problems Resolved Problems Electronic Signature(s) Signed: 01/05/2023 12:34:27 PM By: [name] DO Previous Signature: 01/05/2023 12:29:31 PM Version By: [name] DO Entered By: [name] on 01/05/2023 12:29:51 -------------------------------------------------------------------------------- Progress  Note Details Patient Name: Date of Service: [name] [name] 01/05/2023 10:15 [name]: [id number] Patient Account Number: [id number] Date of Birth/Sex: Treating RN: 21-Mar-1951 (72 y.o. M) Primary Care Provider: [name] Other Clinician: Referring Provider: Treating Provider/Extender: [name], [name] in Treatment: 24 Subjective Chief Complaint Information obtained from Patient 07/16/2022; bilateral lower extremity wounds [name], [name] [phone].pdf Page 7 of 11 History of Present Illness (HPI) Admission 07/16/2022 Mr. 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 He is not sure how the wounds started. He has been following with podiatry for this issue. He had removal of the tendon to the right lower extremity by Dr. SBlenda Mounts He has been using Betadine to the wound beds. On 06/11/2022 he had [name] right posterior tibial artery angioplasty by Dr. CCarlis Abbott It was reported the patient is optimized after revascularization of the right lower extremity. His ABIs on the left were 0.91. He currently denies systemic signs of infection. He also does not wear shoes. He came in with Kerlix wrap to his feet bilaterally. This did not completely cover his feet. 07/23/2022: This is [name] patient of Dr. HJodene Namthat she asked me to take [name] look at last week due to the significant involvement of the muscle and tendon on his right posterior leg wound. He needed an aggressive debridement and asked me if I would be able to perform this on her behalf. The patient is here  today for that procedure. When his dressing was removed in clinic today, the wound was teeming with maggots. There is necrotic muscle, tendon, and fat, with [name] thick layer of slough on the wound. 9/21; patient presents for follow-up. He was debrided by Dr. CCeline Ahrat last clinic visit without any issues. He has been placing Dakin's wet-to-dry dressings to the right posterior wound. He has been using Medihoney to the left foot wound. He reports improvement in wound healing. He has no issues or complaints today. 9/28; patient with type 2 diabetes PAD status post revascularization. He underwent retrograde right PTA angioplasty. Last saw Dr. CCarlis Abbotton 9/12 and was felt to have [name] brisk posterior tibial signal at the right ankle. He had [name] pulsatile toe tracing that appeared adequate for healing. He has been using Santyl and Hydrofera Blue on the right Achilles area. oo He has [name] smaller area on the left fifth plantar metatarsal head They were apparently in the ER on 9/23 with swelling and discoloration above the wrap. They have [name] picture of the leg after the wrap was taken off which looks like that it was excessively tight superiorly 10/6; patient presents for follow-up. We have been using Santyl and Hydrofera Blue to the right lower extremity wound under Kerlix/Coban. T the left foot we o have been using silver alginate with Medihoney. He again comes in with no shoes. He has no issues or complaints today. 10/12; patient presents for follow-up. We have been using Santyl and Hydrofera Blue to the right lower extremity under Kerlix/Coban And silver alginate to the left foot wound. He has no issues today. He denies signs of infection. 10/19; patient presents for follow-up. We continue to use Santyl and Hydrofera Blue to the right lower extremity under Kerlix/Coban and silver alginate to the left foot wound. There is been improvement in wound healing. 10/26; patient presents for follow-up. We have been using  Santyl and Hydrofera Blue to the right lower extremity under Kerlix/Coban and silver alginate to the left foot. There continues to be improvement in wound healing. Patient has no issues or complaints today. 11/2; the patient's area on the right Achilles heel looks quite healthy and is improved  in terms of measurements. We have been using Hydrofera Blue. He is approved for Grafix On the left plantar foot wound over the fifth metatarsal head this tunnels over the lateral part of the met head. He is not offloading this at all 11/16; patient presents for follow-up. He has been using [name] surgical shoe with an offloading felt pad to the left lateral foot along with silver alginate. He has been approved for Grafix and this is available for placement today T the right lower extremity wound. Patient Is agreeable to this. We have been using Santyl and o Hydrofera Blue under Kerlix/Coban to this area. 11/21; patient presents for follow-up. We have been doing Grafix to the right lower extremity and silver alginate to the left foot wound. He has no issues or complaints today. 11/30; patient presents for follow-up. We have been doing Grafix to the right lower extremity wound and Medihoney with Hydrofera Blue to the left lateral foot wound. He reports pain to the left foot wound today. He did not obtain his x-ray. 12/11; patient presents for follow-up. He has been using Dakin's wet-to-dry dressings to the left plantar foot wound. We have been doing Grafix to the right lower extremity wound under compression therapy. He obtained his x-ray that showed mild periosteal elevation at the resection site with the possibility of osteomyelitis. He currently denies systemic signs of infection. He has been taking doxycycline and Augmentin without issues. He is getting his INR checked by his primary care office. 12/18; patient presents for follow-up. He has been using Dakin's wet-to-dry dressings to the left foot wound. We have  been placing Grafix under compression therapy to the right lower extremity. He is currently taking doxycycline and Augmentin. He denies signs of infection. 12/26; patient presents for follow-up. He has been using Dakin's wet-to-dry dressings to the left foot. He has been off oral antibiotics for potential bone biopsy. We have been placing Grafix under compression therapy to the right lower extremity. There has been improvement in wound healing to both sites. He states he is trying to aggressively offload the left foot. He currently denies signs of infection. 1/2; patient presents for follow-up. He has been doing Dakin's wet-to-dry dressings to the left foot. He saw Dr. Blenda Mounts today and had [name] bone biopsy. Debridement was performed on the site. Dressing in place with Dakin's wet-to-dry. T the right leg we have been using Grafix under compression therapy. He o has no issues or complaints today. 1/9; patient presents for follow-up. We have been using Hydrofera Blue under compression therapy to the right lower extremity wound. This is well-healing. He has been using Dakin's wet-to-dry dressings to the left lateral foot wound. He has been taking ciprofloxacin due to culture results without issues. He was referred to infectious disease by podiatry. He does not have an appointment yet. 1/16; patient presents for follow-up. We were using collagen under Kerlix/Coban to the right lower extremity wound. He has been approved for Grafix and this was available for placement today. He continues to use Dakin's wet-to-dry dressings daily to the left foot. He started levofloxacin. He has an appointment with infectious disease on 1/24. 1/23; patient presents for follow-up. Grafix was placed in standard fashion to the right lower extremity wound last week. He has been using Dakin's wet-to-dry dressings to the left foot. He has been taking levofloxacin. He has an infectious disease appointment tomorrow as well as [name]  pulmonology appointment. He followed up with vein and vascular on 1/16 and plan is  for angiogram of the left lower extremity. He has [name] history of left peroneal angioplasty in 2022. 1/30; patient presents for follow-up. Grafix was placed in standard fashion to the right lower extremity wound last week. There has been improvement in healing here. He has been using Dakin's wet-to-dry dressings to the left foot. Patient saw infectious disease, Dr. Juleen China on 1/24 and plan is for PICC line on 2/5 with cefepime. For now he is taking levofloxacin. He has been cleared by pulmonology for HBO. Due to extensive heart history we have asked HBO clearance from his cardiologist as well. He has follow-up with them tomorrow. He currently has no issues or complaints today. 2/6; patient presents for follow-up. He started HBO therapy today and had no issues with his dive. He had an arteriogram on 2/1 by Dr. Carlis Abbott. He had angioplasty of the left peroneal artery. He is optimized from [name] vascular standpoint. We have been using Grafix to the right lower extremity wound and Dakin's wet-to-dry dressings to the left foot wound. He started his IV antibiotics yesterday. [name], [name] [id number] Page 8 of 11 2/13; patient presents for follow-up. We have been using Grafix to the right lower extremity wound under compression therapy and Dakin's wet-to-dry dressings to the left lateral wound. He is continued IV antibiotics without issues. 2/20; Patient presents for follow-up. We have been using Grafix to the right lower extremity wound under compression and Dakin's wet-to-dry dressings to the left lateral foot wound. Per patient since he cannot do two courses of IV antibiotics daily plan is to switch to oral antibiotics. Patient has not done HBO since 2/15. Plan is to be evaluated by ophthalmology to assure there are no issues with his vision. 2/27; patient presents for follow-up. We have  been using Grafix to the right lower extremity under compression therapy and Dakin's wet-to-dry dressings to the left lateral foot wound. He has switched over to oral antibiotics for the left foot with chronic osteomyelitis. He is scheduled to see ophthalmology later this week to be cleared for HBO. Patient History Information obtained from Chart. Family History Diabetes - Mother, Heart Disease - Mother,Siblings. Social History Current every day smoker, Alcohol Use - Never, Drug Use - No History, Caffeine Use - Never. Medical History Respiratory Patient has history of Sleep Apnea - does not wear Cardiovascular Patient has history of Arrhythmia - [name]. Fib, Congestive Heart Failure - EF 25%, Hypertension, Peripheral Arterial Disease Denies history of Peripheral Venous Disease Endocrine Patient has history of Type II Diabetes Hospitalization/Surgery History - Angiogram 06/11/2022 Dr. Carlis Abbott [facility]. - cardioversion 11/06/2021, 2015, 2017. 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 Medical [name] Surgical History Notes nd Gastrointestinal Ascites Genitourinary stage III Kidney disease Objective Constitutional respirations regular, non-labored and within target range for patient.. Vitals Time Taken: 11:34 AM, Height: 68 in, Weight: 185 lbs, BMI: 28.1, Temperature: 98.1 F, Pulse: 87 bpm, Respiratory Rate: 18 breaths/min, Blood Pressure: 146/95 mmHg. Cardiovascular 2+ dorsalis pedis/posterior tibialis pulses. Psychiatric pleasant and cooperative. General Notes: Right lower extremity: T the posterior aspect there is an open wound with granulation tissue and nonviable tissue. Venous stasis dermatitis. T o o the left lateral foot there are two small open areas that is almost epithelialized. Does not probe to bone. No surrounding signs of soft tissue infection. Integumentary (Hair, Skin) Wound #1 status is Open. Original cause of wound was Surgical Injury. The date acquired was: 07/16/2022. The wound has been  in treatment 24 weeks. The wound is located on the  Right Achilles. The wound measures 2cm length x 0.2cm width x 0.1cm depth; 0.314cm^2 area and 0.031cm^3 volume. There is Fat Layer (Subcutaneous Tissue) exposed. There is no tunneling or undermining noted. There is [name] none present amount of drainage noted. The wound margin is distinct with the outline attached to the wound base. There is small (1-33%) red granulation within the wound bed. There is [name] large (67-100%) amount of necrotic tissue within the wound bed including Eschar. The periwound skin appearance exhibited: Dry/Scaly. The periwound skin appearance did not exhibit: Callus, Crepitus, Excoriation, Induration, Rash, Scarring, Maceration, Atrophie Blanche, Cyanosis, Ecchymosis, Hemosiderin Staining, Mottled, Pallor, Rubor, Erythema. Wound #2 status is Open. Original cause of wound was Gradually Appeared. The date acquired was: 03/09/2022. The wound has been in treatment 24 weeks. The wound is located on the [place]. The wound measures 0.2cm length x 0.1cm width x 0.2cm depth; 0.016cm^2 area and 0.003cm^3 volume. There is bone and Fat Layer (Subcutaneous Tissue) exposed. There is no tunneling or undermining noted. There is [name] medium amount of serosanguineous drainage noted. The wound margin is distinct with the outline attached to the wound base. There is large (67-100%) pink, friable granulation within the wound bed. There is [name] small (1-33%) amount of necrotic tissue within the wound bed including Adherent Slough. The periwound skin appearance exhibited: Callus. The periwound skin appearance did not exhibit: Crepitus, Excoriation, Induration, Rash, Scarring, Dry/Scaly, Maceration, Atrophie Blanche, Cyanosis, Ecchymosis, Hemosiderin Staining, Mottled, Pallor, Rubor, Erythema. 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 He has completed his course of Grafix covered by insurance at this time. I debrided nonviable tissue and recommended Hydrofera Blue under compression therapy to the right lower extremity. The left lateral foot wound appears almost healed. I recommended continuing Dakin's wet-to-dry dressings here. He is scheduled to see ophthalmology later this week. He will ask for clearance to continue HBO therapy. He currently denies systemic signs of infection. Continue aggressive offloading. Procedures Wound #1 Pre-procedure diagnosis of Wound #1 is [name] Diabetic Wound/Ulcer of the Lower Extremity located on the Right Achilles .Severity of Tissue Pre Debridement is: Fat layer exposed. There was [name] Selective/Open Wound Skin/Epidermis Debridement with [name] total area of 1 sq cm performed by [name], DO. With the following instrument(s): Curette to remove Non-Viable tissue/material. Material removed includes Callus, Skin: Dermis, and Skin: Epidermis after achieving pain control using Lidocaine 4% T opical Solution. [name] time out was conducted at 11:50, prior to the start of the procedure. [name] Minimum amount of bleeding was controlled with Pressure. The procedure was tolerated well with [name] pain level of 0 throughout and [name] pain level of 0 following the procedure. Post Debridement Measurements: 2cm length x 0.2cm width x 0.1cm depth; 0.031cm^3 volume. Character of Wound/Ulcer Post Debridement is improved. Severity of Tissue Post Debridement is: Fat layer exposed. Post procedure Diagnosis Wound #1: Same as Pre-Procedure Plan Follow-up  Appointments: Return Appointment in 1 week. - 01/14/2023 0930 Dr. Heber Allerton after hyperbarics. Other: - NO LOTION due to hyperbarics. ****ensure you go see your eye doctor for clearance of hyperbarics. Anesthetic: (In clinic) Topical Lidocaine 5%  applied to wound bed Cellular or Tissue Based Products: Wound #1 Right Achilles: Cellular or Tissue Based Product Type: - Grafix approved-waiting to hear about copay or coinsurance-cost for you out of your pocket. Kerecsis denied by insurance. Grafix #1 applied on 09/24/22 Grafix #2 applied on 09/29/2022 Grafix # 3 applied on 10/07/22 Grafix # 4 applied on 10/19/22 Grafix # 5 applied 10/26/2022 Grafix applied to right achilles wound 11/03/2022 Grafix pending insurance approval 11/24/2022 Grafix approved from now to 02/14/2023 total 12. #7 applied. Grafix # 8 applied 12/01/22 Grafix # 9 applied 12/08/22 Grafix #10 applied to 12/15/2022 Grafix #11 applied to 12/22/2022 Grafix #12 applied to 12/29/22 Edema Control - Lymphedema / SCD / Other: Avoid standing for long periods of time. Off-Loading: Open toe surgical shoe to: - ****put PegAssist in surgical shoe.**** Prevalon Boot - left foot while resting in bed at night. Other: - minimize walking and standing to aid in offloading pressure to wound. Hyperbaric Oxygen Therapy: Wound #2 Left,Plantar Foot: Evaluate for HBO Therapy Indication: - Wagner grade 3 If appropriate for treatment, begin HBOT per protocol: 2.0 ATA for 90 Minutes with 2 Five (5) Minute Air Breaks T Number of Treatments: - 40 otal One treatments per day (delivered Monday through Friday unless otherwise specified in Special Instructions below): Finger stick Blood Glucose Pre- and Post- HBOT Treatment. Follow Hyperbaric Oxygen Glycemia Protocol Afrin (Oxymetazoline HCL) 0.05% nasal spray - 1 spray in both nostrils daily as needed prior to HBO treatment for difficulty clearing ears WOUND #1: - Achilles Wound Laterality: Right Cleanser: Soap and  Water 1 x Per Week/30 Days Discharge Instructions: May shower and wash wound with dial antibacterial soap and water prior to dressing change. Cleanser: Wound Cleanser 1 x Per Week/30 Days Discharge Instructions: Cleanse the wound with wound cleanser prior to applying [name] clean dressing using gauze sponges, not tissue or cotton balls. Prim Dressing: Hydrofera Blue Ready Transfer Foam, 2.5x2.5 (in/in) 1 x Per Week/30 Days ary Discharge Instructions: Apply directly to wound bed as directed Secondary Dressing: ABD Pad, 5x9 1 x Per Week/30 Days Discharge Instructions: Apply over primary dressing as directed. Secondary Dressing: Woven Gauze Sponge, Non-Sterile 4x4 in 1 x Per Week/30 Days Discharge Instructions: Apply over primary dressing as directed. Com pression Wrap: Kerlix Roll 4.5x3.1 (in/yd) 1 x Per Week/30 Days Discharge Instructions: Apply Kerlix and Coban compression as directed. Com pression Wrap: Coban Self-Adherent Wrap 4x5 (in/yd) 1 x Per Week/30 Days Discharge Instructions: Apply over Kerlix as directed. WOUND #2: - Foot Wound Laterality: Plantar, Left Cleanser: Wound Cleanser 1 x Per Day/30 Days Discharge Instructions: Cleanse the wound with wound cleanser prior to applying [name] clean dressing using gauze sponges, not tissue or cotton balls. Peri-Wound Care: Skin Prep (DME) (Generic) 1 x Per Day/30 Days Discharge Instructions: Use skin prep as directed Prim Dressing: Dakin's Solution 0.25%, 16 (oz) 1 x Per Day/30 Days ary Discharge Instructions: Moisten gauze with Dakin's solution Secondary Dressing: Optifoam Non-Adhesive Dressing, 4x4 in (DME) (Generic) 1 x Per Day/30 Days [name], [name] ([id number]) [id number] Page 10 of 11 Discharge Instructions: Apply over primary dressing as directed. Secondary Dressing: Woven Gauze Sponge, Non-Sterile 4x4 in (DME) (Generic) 1 x Per Day/30 Days Discharge Instructions: Apply over primary dressing as  directed. Secondary Dressing: Woven Gauze Sponges 2x2 in (DME) (Generic) 1 x Per Day/30 Days Discharge Instructions: Apply over primary dressing as directed. Secured With: Child psychotherapist, Sterile 2x75 (in/in) (DME) (Generic) 1 x Per Day/30 Days Discharge Instructions: Secure with stretch  gauze as directed. Secured With: 41M Medipore Public affairs consultant Surgical T 2x10 (in/yd) (DME) (Generic) 1 x Per Day/30 Days ape Discharge Instructions: Secure with tape as directed. 1. In office sharp debridementooright lower extremity 2. Hydrofera Blue under Kerlix/Cobanooright lower extremity 3. Dakin's wet-to-dry dressingsooleft lower extremity 4. Aggressive offloadingoosurgical shoe with peg assist 5. Follow-up in 1 week Electronic Signature(s) Signed: 01/05/2023 12:34:27 PM By: [name] DO Entered By: [name] on 01/05/2023 12:33:40 -------------------------------------------------------------------------------- HxROS Details Patient Name: Date of Service: [name] [name] 01/05/2023 10:15 [name]: [id number] Patient Account Number: [id number] Date of Birth/Sex: Treating RN: Feb 13, 1951 (72 y.o. M) Primary Care Provider: [name] Other Clinician: Referring Provider: Treating Provider/Extender: [name] Ngetich, [name] in Treatment: 24 Information Obtained From Chart Respiratory Medical History: Positive for: Sleep Apnea - does not wear Cardiovascular Medical History: Positive for: Arrhythmia - [name]. Fib; Congestive Heart Failure - EF 25%; Hypertension; Peripheral Arterial Disease Negative for: Peripheral Venous Disease Gastrointestinal Medical History: Past Medical History Notes: Ascites Endocrine Medical History: Positive for: Type II Diabetes Time with diabetes: 6 years Treated with: Insulin Blood sugar tested every day: No Genitourinary Medical History: Past Medical History Notes: stage III Kidney  disease Immunizations Pneumococcal Vaccine: Received Pneumococcal Vaccination: No Implantable Devices No devices added Hospitalization / Surgery History [name], [name] [phone].pdf Page 11 of 11 Type of Hospitalization/Surgery Angiogram 06/11/2022 Dr. 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 01/05/2023 Medical Record Number: [id number] Patient Account Number: [id number] Date of Birth/Sex: Treating RN: 1951-04-14 (72 y.o. 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 M86.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, left ankle and foot Facility Procedures : CPT4 Code: [id number] Description: [id number] - DEBRIDE WOUND 1ST 20 SQ CM OR < ICD-10 Diagnosis Description L97.813 Non-pressure chronic ulcer of other part of  right lower leg with necrosis of mus Modifier: cle Quantity: 1 Physician Procedures : CPT4 Code Description Modifier [id number] - WC PHYS LEVEL 3 - EST PT 25 ICD-10 Diagnosis Description E11.621 Type 2 diabetes mellitus with foot ulcer L97.522 Non-pressure chronic ulcer of other part of left foot with fat layer exposed M86.672 Other  chronic osteomyelitis, left ankle and foot Quantity: 1 : [id number] - WC PHYS DEBR WO ANESTH 20 SQ CM ICD-10 Diagnosis Description 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

## 2023-01-07 NOTE — Progress Notes (Signed)
[name], [name] ([id number]) [id number] Page 1 of 9 Visit Report for 01/05/2023 Arrival Information Details Patient Name: Date of Service: [name] [facility] [name]. 01/05/2023 10:15 [name] Medical Record Number: [id number] Patient Account Number: [id number] Date of Birth/Sex: Treating RN: 04-Dec-1950 (72 y.Darren. [name] Primary Care Zakeya Junker: [name] Other Clinician: Referring Tamu Golz: Treating Mikayla Chiusano/Extender: [name] Ngetich, [name] in Treatment: 24 Visit Information History Since Last Visit Added or deleted any medications: No Patient Arrived: Cane Any new allergies or adverse reactions: No Arrival Time: 11:27 Had [name] fall or experienced change in No Accompanied By: daughter activities of daily living that may affect Transfer Assistance: Manual risk of falls: Patient Identification Verified: Yes Signs or symptoms of abuse/neglect since last visito No Patient Requires Transmission-Based Precautions: No Hospitalized since last visit: No Patient Has Alerts: Yes Implantable device outside of the clinic excluding No Patient Alerts: Patient on Blood Thinner cellular tissue based products placed in the center since last visit: Has Dressing in Place as Prescribed: Yes Has Compression in Place as Prescribed: Yes Pain Present Now: Yes Notes Pt. sees Eye doctor Thursday and is on Oral Antibiotics Electronic Signature(s) Signed: 01/06/2023 8:33:02 AM By: Dellie Catholic RN Entered By: Dellie Catholic on 01/05/2023 11:41:21 -------------------------------------------------------------------------------- Encounter Discharge Information Details Patient Name: Date of Service: [name], [name]. 01/05/2023 10:15 [name] Medical Record Number: [id number] Patient Account Number: [id number] Date of Birth/Sex: Treating RN: 08-26-1951 (24 y.Darren. [name] Primary Care Florence Antonelli: [name] Other Clinician: Referring Jadaya Sommerfield: Treating  Shaleena Crusoe/Extender: [name] Ngetich, [name] in Treatment: 24 Encounter Discharge Information Items Post Procedure Vitals Discharge Condition: Stable Temperature (F): 98.1 Ambulatory Status: Walker Pulse (bpm): 87 Discharge Destination: Home Respiratory Rate (breaths/min): 20 Transportation: Private Auto Blood Pressure (mmHg): 146/95 Accompanied By: daughter Schedule Follow-up Appointment: Yes Clinical Summary of Care: Electronic Signature(s) Signed: 01/06/2023 6:07:49 PM By: Deon Pilling RN, BSN Entered By: Deon Pilling on 01/05/2023 11:58:28 -------------------------------------------------------------------------------- Lower Extremity Assessment Details Patient Name: Date of Service: [name] [facility] [name]. 01/05/2023 10:15 [name] Medical Record Number: [id number] Patient Account Number: [id number] Date of Birth/Sex: Treating RN: April 20, 1951 (89 y.Darren. [name] Primary Care Jerrit Horen: [name] Other Clinician: Referring Shannan Garfinkel: Treating Jagjit Riner/Extender: [name], [name] in Treatment: [address], [place] [name] ([id number]) [id number] Page 2 of 9 Edema Assessment Assessed: [Left: No] [Right: No] Edema: [Left: Yes] [Right: Yes] Calf Left: Right: Point of Measurement: 39 cm From Medial Instep 40.5 cm 46 cm Ankle Left: Right: Point of Measurement: 8 cm From Medial Instep 26.5 cm 26 cm Electronic Signature(s) Signed: 01/06/2023 8:33:02 AM By: Dellie Catholic RN Entered By: Dellie Catholic on 01/05/2023 11:40:43 -------------------------------------------------------------------------------- Multi Wound Chart Details Patient Name: Date of Service: [name] [name]. 01/05/2023 10:15 [name] Medical Record Number: [id number] Patient Account Number: [id number] Date of Birth/Sex: Treating RN: 07-18-1951 (70 y.Darren. [name]) Primary Care Jamaira Sherk: [name] Other Clinician: Referring Baden Betsch: Treating  Robynn Marcel/Extender: [name] Ngetich, [name] in Treatment: 24 Vital Signs Height(in): 34 Pulse(bpm): 20 Weight(lbs): 185 Blood Pressure(mmHg): 146/95 Body Mass Index(BMI): 28.1 Temperature(F): 98.1 Respiratory Rate(breaths/min): 18 [1:Photos:] [N/[name]:N/[name]] Right Achilles Left, Plantar Foot N/[name] Wound Location: Surgical Injury Gradually Appeared N/[name] Wounding Event: Diabetic Wound/Ulcer of the Lower Diabetic Wound/Ulcer of the Lower N/[name] Primary Etiology: Extremity Extremity Sleep Apnea, Arrhythmia, Congestive Sleep Apnea, Arrhythmia, Congestive N/[name] Comorbid History: Heart Failure, Hypertension, Peripheral Heart Failure, Hypertension, Peripheral Arterial Disease, Type II Diabetes Arterial Disease, Type II  Diabetes 07/16/2022 03/09/2022 N/[name] Date Acquired: 24 24 N/[name] Weeks of Treatment: Open Open N/[name] Wound Status: No No N/[name] Wound Recurrence: 2x0.2x0.1 0.2x0.1x0.2 N/[name] Measurements L x W x D (cm) 0.314 0.016 N/[name] [name] (cm) : rea 0.031 0.003 N/[name] Volume (cm) : 98.50% 92.70% N/[name] % Reduction in [name] rea: 99.70% 95.50% N/[name] % Reduction in Volume: Grade 2 Grade 3 N/[name] Classification: None Present Medium N/[name] Exudate [name] mount: N/[name] Serosanguineous N/[name] Exudate Type: N/[name] red, brown N/[name] Exudate Color: Distinct, outline attached Distinct, outline attached N/[name] Wound Margin: Small (1-33%) Large (67-100%) N/[name] Granulation [name] mount: Red Pink, Friable N/[name] Granulation Quality: Large (67-100%) Small (1-33%) N/[name] Necrotic [name] mount: Eschar Adherent Slough N/[name] Necrotic Tissue: Fat Layer (Subcutaneous Tissue): Yes Fat Layer (Subcutaneous Tissue): Yes N/[name] Exposed Structures: Fascia: No Bone: Yes Tendon: No Fascia: No [name], [name] [name] ([id number] Page 3 of 9 Muscle: No Tendon: No Joint: No Muscle: No Bone: No Joint: No Large (67-100%) Large (67-100%) N/[name] Epithelialization: Debridement - Selective/Open Wound N/[name]  N/[name] Debridement: Pre-procedure Verification/Time Out 11:50 N/[name] N/[name] Taken: Lidocaine 4% Topical Solution N/[name] N/[name] Pain Control: Callus N/[name] N/[name] Tissue Debrided: Skin/Epidermis N/[name] N/[name] Level: 1 N/[name] N/[name] Debridement [name] (sq cm): rea Curette N/[name] N/[name] Instrument: Minimum N/[name] N/[name] Bleeding: Pressure N/[name] N/[name] Hemostasis [name] chieved: 0 N/[name] N/[name] Procedural Pain: 0 N/[name] N/[name] Post Procedural Pain: Procedure was tolerated well N/[name] N/[name] Debridement Treatment Response: 2x0.2x0.1 N/[name] N/[name] Post Debridement Measurements L x W x D (cm) 0.031 N/[name] N/[name] Post Debridement Volume: (cm) Excoriation: No Callus: Yes N/[name] Periwound Skin Texture: Induration: No Excoriation: No Callus: No Induration: No Crepitus: No Crepitus: No Rash: No Rash: No Scarring: No Scarring: No Dry/Scaly: Yes Maceration: No N/[name] Periwound Skin Moisture: Maceration: No Dry/Scaly: No Atrophie Blanche: No Atrophie Blanche: No N/[name] Periwound Skin Color: Cyanosis: No Cyanosis: No Ecchymosis: No Ecchymosis: No Erythema: No Erythema: No Hemosiderin Staining: No Hemosiderin Staining: No Mottled: No Mottled: No Pallor: No Pallor: No Rubor: No Rubor: No Debridement N/[name] N/[name] Procedures Performed: Treatment Notes Wound #1 (Achilles) Wound Laterality: Right Cleanser Soap and Water Discharge Instruction: May shower and wash wound with dial antibacterial soap and water prior to dressing change. Wound Cleanser Discharge Instruction: Cleanse the wound with wound cleanser prior to applying [name] clean dressing using gauze sponges, not tissue or cotton balls. Peri-Wound Care Topical Primary Dressing Hydrofera Blue Ready Transfer Foam, 2.5x2.5 (in/in) Discharge Instruction: Apply directly to wound bed as directed Secondary Dressing ABD Pad, 5x9 Discharge Instruction: Apply over primary dressing as directed. Woven Gauze Sponge, Non-Sterile 4x4 in Discharge Instruction: Apply over primary dressing as directed. Secured  With Compression Wrap Kerlix Roll 4.5x3.1 (in/yd) Discharge Instruction: Apply Kerlix and Coban compression as directed. Coban Self-Adherent Wrap 4x5 (in/yd) Discharge Instruction: Apply over Kerlix as directed. Compression Stockings Add-Ons Wound #2 (Foot) Wound Laterality: Plantar, Left Cleanser Wound Cleanser Discharge Instruction: Cleanse the wound with wound cleanser prior to applying [name] clean dressing using gauze sponges, not tissue or cotton balls. HALLETT, SOTER [name] ([id number]) [id number] Page 4 of 9 Peri-Wound Care Skin Prep Discharge Instruction: Use skin prep as directed Topical Primary Dressing Dakin's Solution 0.25%, 16 (oz) Discharge Instruction: Moisten gauze with Dakin's solution Secondary Dressing Optifoam Non-Adhesive Dressing, 4x4 in Discharge Instruction: Apply over primary dressing as directed. Woven Gauze Sponge, Non-Sterile 4x4 in Discharge Instruction: Apply over primary dressing as directed. Woven Gauze Sponges 2x2 in Discharge Instruction: Apply over primary dressing as directed. Secured With Conforming Stretch Gauze Bandage, Sterile 2x75 (in/in) Discharge  Instruction: Secure with stretch gauze as directed. 57M Medipore Soft Cloth Surgical T 2x10 (in/yd) ape Discharge Instruction: Secure with tape as directed. Compression Wrap Compression Stockings Add-Ons Electronic Signature(s) Signed: 01/05/2023 12:34:27 PM By: [name] DO Entered By: [name] on 01/05/2023 12:29:58 -------------------------------------------------------------------------------- Multi-Disciplinary Care Plan Details Patient Name: Date of Service: [name] [facility] [name]. 01/05/2023 10:15 [name] Medical Record Number: [id number] Patient Account Number: [id number] Date of Birth/Sex: Treating RN: 1951/11/01 (76 y.Darren. [name] Primary Care Sharrie Self: [name] Other Clinician: Referring Skylinn Vialpando: Treating Zierra Laroque/Extender: [name] Ngetich, [name] in Treatment: 24 Active Inactive Nutrition Nursing Diagnoses: Potential for alteratiion in Nutrition/Potential for imbalanced nutrition Goals: Patient/caregiver agrees to and verbalizes understanding of need to use nutritional supplements and/or vitamins as prescribed Date Initiated: 07/16/2022 Target Resolution Date: 02/05/2023 Goal Status: Active Patient/caregiver will maintain therapeutic glucose control Date Initiated: 07/16/2022 Target Resolution Date: 02/05/2023 Goal Status: Active Interventions: Assess HgA1c results as ordered upon admission and as needed Provide education on nutrition Treatment Activities: Education provided on Nutrition : 10/19/2022 Obtain HgA1c : 07/16/2022 Patient referred to Primary Care Physician for further nutritional evaluation : 07/16/2022 Notes: [name], [name] ([id number]) [id number] Page 5 of 9 Pain, Acute or Chronic Nursing Diagnoses: Pain, acute or chronic: actual or potential Potential alteration in comfort, pain Goals: Patient will verbalize adequate pain control and receive pain control interventions during procedures as needed Date Initiated: 07/16/2022 Target Resolution Date: 02/05/2023 Goal Status: Active Interventions: Encourage patient to take pain medications as prescribed Provide education on pain management Reposition patient for comfort Treatment Activities: Administer pain control measures as ordered : 07/16/2022 Notes: Electronic Signature(s) Signed: 01/06/2023 6:07:49 PM By: Deon Pilling RN, BSN Entered By: Deon Pilling on 01/05/2023 11:48:10 -------------------------------------------------------------------------------- Pain Assessment Details Patient Name: Date of Service: [name]. 01/05/2023 10:15 [name] Medical Record Number: [id number] Patient Account Number: [id number] Date of Birth/Sex: Treating RN: [birth year] (37 y.Darren. [name] Primary Care  Zerick Prevette: [name] Other Clinician: Referring Ladell Bey: Treating Elide Stalzer/Extender: [name] Ngetich, [name] in Treatment: 24 Active Problems Location of Pain Severity and Description of Pain Patient Has Paino Yes Site Locations Pain Location: Generalized Pain With Dressing Change: No Duration of the Pain. Constant / Intermittento Constant Rate the pain. Current Pain Level: 3 Worst Pain Level: 10 Least Pain Level: 2 Tolerable Pain Level: 3 Character of Pain Describe the Pain: Difficult to Pinpoint Pain Management and Medication Current Pain Management: Medication: Yes Cold Application: No Rest: Yes Massage: No Activity: No T.E.N.S.: No Heat Application: No Leg drop or elevation: No Is the Current Pain Management Adequate: Adequate How does your wound impact your activities of daily livingo Sleep: No Bathing: No Appetite: No Relationship With Others: No [name], [name] ([id number] Page 6 of 9 Bladder Continence: No Emotions: No Bowel Continence: No Work: No Toileting: No Drive: No Dressing: No Hobbies: No Electronic Signature(s) Signed: 01/06/2023 8:33:02 AM By: Dellie Catholic RN Entered By: Dellie Catholic on 01/05/2023 11:40:24 -------------------------------------------------------------------------------- Patient/Caregiver Education Details Patient Name: Date of Service: [name] [name]. 2/27/2024andnbsp10:15 [name] Medical Record Number: [id number] Patient Account Number: [id number] Date of Birth/Gender: Treating RN: [birth year] (79 y.Darren. [name] Primary Care Physician: [name] Other Clinician: Referring Physician: Treating Physician/Extender: [name] in Treatment: 24 Education Assessment Education Provided To: Patient and Caregiver Education Topics Provided Wound/Skin Impairment: Handouts: Caring for Your Ulcer Methods:  Explain/Verbal Responses: Reinforcements needed Electronic Signature(s) Signed:  01/06/2023 6:07:49 PM By: Deon Pilling RN, BSN Entered By: Deon Pilling on 01/05/2023 11:48:24 -------------------------------------------------------------------------------- Wound Assessment Details Patient Name: Date of Service: [name] [facility] [name]. 01/05/2023 10:15 [name] Medical Record Number: [id number] Patient Account Number: [id number] Date of Birth/Sex: Treating RN: [birth year] (64 y.Darren. [name] Primary Care Arda Keadle: [name] Other Clinician: Referring Virgilene Stryker: Treating Aarron Wierzbicki/Extender: [name] Ngetich, [name] in Treatment: 24 Wound Status Wound Number: 1 Primary Diabetic Wound/Ulcer of the Lower Extremity Etiology: Wound Location: Right Achilles Wound Open Wounding Event: Surgical Injury Status: Date Acquired: 07/16/2022 Comorbid Sleep Apnea, Arrhythmia, Congestive Heart Failure, Hypertension, Weeks Of Treatment: 24 History: Peripheral Arterial Disease, Type II Diabetes Clustered Wound: No Photos [name], [name] ([id number] Page 7 of 9 Wound Measurements Length: (cm) 2 Width: (cm) 0.2 Depth: (cm) 0.1 Area: (cm) 0.314 Volume: (cm) 0.031 % Reduction in Area: 98.5% % Reduction in Volume: 99.7% Epithelialization: Large (67-100%) Tunneling: No Undermining: No Wound Description Classification: Grade 2 Wound Margin: Distinct, outline attached Exudate Amount: None Present Foul Odor After Cleansing: No Slough/Fibrino No Wound Bed Granulation Amount: Small (1-33%) Exposed Structure Granulation Quality: Red Fascia Exposed: No Necrotic Amount: Large (67-100%) Fat Layer (Subcutaneous Tissue) Exposed: Yes Necrotic Quality: Eschar Tendon Exposed: No Muscle Exposed: No Joint Exposed: No Bone Exposed: No Periwound Skin Texture Texture Color No Abnormalities Noted: No No Abnormalities Noted: No Callus:  No Atrophie Blanche: No Crepitus: No Cyanosis: No Excoriation: No Ecchymosis: No Induration: No Erythema: No Rash: No Hemosiderin Staining: No Scarring: No Mottled: No Pallor: No Moisture Rubor: No No Abnormalities Noted: No Dry / Scaly: Yes Maceration: No Treatment Notes Wound #1 (Achilles) Wound Laterality: Right Cleanser Soap and Water Discharge Instruction: May shower and wash wound with dial antibacterial soap and water prior to dressing change. Wound Cleanser Discharge Instruction: Cleanse the wound with wound cleanser prior to applying [name] clean dressing using gauze sponges, not tissue or cotton balls. Peri-Wound Care Topical Primary Dressing Hydrofera Blue Ready Transfer Foam, 2.5x2.5 (in/in) Discharge Instruction: Apply directly to wound bed as directed Secondary Dressing ABD Pad, 5x9 Discharge Instruction: Apply over primary dressing as directed. Woven Gauze Sponge, Non-Sterile 4x4 in Discharge Instruction: Apply over primary dressing as directed. Secured With Compression Wrap Kerlix Roll 4.5x3.1 (in/yd) Discharge Instruction: Apply Kerlix and Coban compression as directed. Coban Self-Adherent Wrap 4x5 (in/yd) Discharge Instruction: Apply over Kerlix as directed. Compression Stockings Add-Ons Electronic Signature(s) Signed: 01/06/2023 8:33:02 AM By: Dellie Catholic RN [name], [name] [id number] Page 8 of 9 Signed: 01/06/2023 6:07:49 PM By: Deon Pilling RN, BSN Entered By: Deon Pilling on 01/05/2023 11:41:34 -------------------------------------------------------------------------------- Wound Assessment Details Patient Name: Date of Service: [name] [facility] [name]. 01/05/2023 10:15 [name] Medical Record Number: [id number] Patient Account Number: [id number] Date of Birth/Sex: Treating RN: Jan 14, 1951 (60 y.Darren. [name] Primary Care Meshilem Machuca: [name] Other Clinician: Referring Saverio Kader: Treating  Waylin Dorko/Extender: [name] Ngetich, [name] in Treatment: 24 Wound Status Wound Number: 2 Primary Diabetic Wound/Ulcer of the Lower Extremity Etiology: Wound Location: Left, Plantar Foot Wound Open Wounding Event: Gradually Appeared Status: Date Acquired: 03/09/2022 Comorbid Sleep Apnea, Arrhythmia, Congestive Heart Failure, Hypertension, Weeks Of Treatment: 24 History: Peripheral Arterial Disease, Type II Diabetes Clustered Wound: No Photos Wound Measurements Length: (cm) 0.2 Width: (cm) 0.1 Depth: (cm) 0.2 Area: (cm) 0.016 Volume: (cm) 0.003 % Reduction in Area: 92.7% % Reduction in Volume: 95.5% Epithelialization: Large (67-100%) Tunneling: No Undermining: No Wound Description Classification: Grade 3 Wound Margin: Distinct, outline  attached Exudate Amount: Medium Exudate Type: Serosanguineous Exudate Color: red, brown Foul Odor After Cleansing: No Slough/Fibrino Yes Wound Bed Granulation Amount: Large (67-100%) Exposed Structure Granulation Quality: Pink, Friable Fascia Exposed: No Necrotic Amount: Small (1-33%) Fat Layer (Subcutaneous Tissue) Exposed: Yes Necrotic Quality: Adherent Slough Tendon Exposed: No Muscle Exposed: No Joint Exposed: No Bone Exposed: Yes Periwound Skin Texture Texture Color No Abnormalities Noted: No No Abnormalities Noted: No Callus: Yes Atrophie Blanche: No Crepitus: No Cyanosis: No Excoriation: No Ecchymosis: No Induration: No Erythema: No Rash: No Hemosiderin Staining: No Scarring: No Mottled: No Pallor: No Moisture Rubor: No No Abnormalities Noted: No Dry / Scaly: No Maceration: No STEFFIN, SMEDLEY [name] ([id number] Page 9 of 9 Treatment Notes Wound #2 (Foot) Wound Laterality: Plantar, Left Cleanser Wound Cleanser Discharge Instruction: Cleanse the wound with wound cleanser prior to applying [name] clean dressing using gauze sponges, not tissue or cotton  balls. Peri-Wound Care Skin Prep Discharge Instruction: Use skin prep as directed Topical Primary Dressing Dakin's Solution 0.25%, 16 (oz) Discharge Instruction: Moisten gauze with Dakin's solution Secondary Dressing Optifoam Non-Adhesive Dressing, 4x4 in Discharge Instruction: Apply over primary dressing as directed. Woven Gauze Sponge, Non-Sterile 4x4 in Discharge Instruction: Apply over primary dressing as directed. Woven Gauze Sponges 2x2 in Discharge Instruction: Apply over primary dressing as directed. Secured With Conforming Stretch Gauze Bandage, Sterile 2x75 (in/in) Discharge Instruction: Secure with stretch gauze as directed. 8M Medipore Soft Cloth Surgical T 2x10 (in/yd) ape Discharge Instruction: Secure with tape as directed. Compression Wrap Compression Stockings Add-Ons Electronic Signature(s) Signed: 01/06/2023 8:33:02 AM By: Dellie Catholic RN Signed: 01/06/2023 6:07:49 PM By: Deon Pilling RN, BSN Entered By: Deon Pilling on 01/05/2023 11:41:14 -------------------------------------------------------------------------------- Vitals Details Patient Name: Date of Service: [name]. 01/05/2023 10:15 [name] Medical Record Number: [id number] Patient Account Number: [id number] Date of Birth/Sex: Treating RN: Apr 07, 1951 (84 y.Darren. [name] Primary Care Mahalie Kanner: [name] Other Clinician: Referring Demitrious Mccannon: Treating Hadrian Yarbrough/Extender: [name] Ngetich, [name] in Treatment: 24 Vital Signs Time Taken: 11:34 Temperature (F): 98.1 Height (in): 68 Pulse (bpm): 87 Weight (lbs): 185 Respiratory Rate (breaths/min): 18 Body Mass Index (BMI): 28.1 Blood Pressure (mmHg): 146/95 Reference Range: 80 - 120 mg / dl Electronic Signature(s) Signed: 01/06/2023 8:33:02 AM By: Dellie Catholic RN Entered By: Dellie Catholic on 01/05/2023 11:36:13

## 2023-01-11 ENCOUNTER — Ambulatory Visit (INDEPENDENT_AMBULATORY_CARE_PROVIDER_SITE_OTHER): Payer: Medicaid Other | Admitting: Podiatry

## 2023-01-11 DIAGNOSIS — I5022 Chronic systolic (congestive) heart failure: Secondary | ICD-10-CM | POA: Diagnosis not present

## 2023-01-11 DIAGNOSIS — B9689 Other specified bacterial agents as the cause of diseases classified elsewhere: Secondary | ICD-10-CM | POA: Diagnosis not present

## 2023-01-11 DIAGNOSIS — Z91199 Patient's noncompliance with other medical treatment and regimen due to unspecified reason: Secondary | ICD-10-CM

## 2023-01-11 DIAGNOSIS — E1151 Type 2 diabetes mellitus with diabetic peripheral angiopathy without gangrene: Secondary | ICD-10-CM | POA: Diagnosis not present

## 2023-01-11 DIAGNOSIS — E11621 Type 2 diabetes mellitus with foot ulcer: Secondary | ICD-10-CM | POA: Diagnosis not present

## 2023-01-11 DIAGNOSIS — L97529 Non-pressure chronic ulcer of other part of left foot with unspecified severity: Secondary | ICD-10-CM | POA: Diagnosis not present

## 2023-01-11 DIAGNOSIS — M86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, left ankle and foot: Secondary | ICD-10-CM | POA: Diagnosis not present

## 2023-01-11 DIAGNOSIS — E1169 Type 2 diabetes mellitus with other specified complication: Secondary | ICD-10-CM | POA: Diagnosis not present

## 2023-01-11 DIAGNOSIS — B965 Pseudomonas (aeruginosa) (mallei) (pseudomallei) as the cause of diseases classified elsewhere: Secondary | ICD-10-CM | POA: Diagnosis not present

## 2023-01-11 DIAGNOSIS — I110 Hypertensive heart disease with heart failure: Secondary | ICD-10-CM | POA: Diagnosis not present

## 2023-01-11 NOTE — Progress Notes (Signed)
No show

## 2023-01-11 NOTE — Progress Notes (Deleted)
HISTORY AND PHYSICAL     CC:  follow up. Requesting Provider:  Sandrea Hughs, NP  HPI: This is a 72 y.o. male who is here today for follow up for PAD.  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  He had been having pain and blistering for about 7 weeks that had progressed.  It was frankly necrotic with maggot infestation.  He did not have any claudication or rest pain.     On May 19, 2021, he underwent left peroneal angioplasty via right CFA by Dr. Carlis Abbott.  On May 22, 2021, he underwent left 5th toe amputation also by Dr. Carlis Abbott.    On 06/11/2022, he had angiogram with retrograde access of the right PTA at the ankle and had right PTA angioplasty via retrograde tibial access by Dr. Carlis Abbott for tissue loss.  On 12/10/2022, he underwent angiogram with left peroneal angioplasty by Dr. 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    Pt was last seen *** and at that time, ***  The pt returns today for follow up.  ***  The pt *** on a statin for cholesterol management.    The pt is not on an aspirin.    Other AC:  Plavix/coumadin The pt is on BB, diuretic for hypertension.  The pt does  have diabetes. Tobacco hx:  ***  Pt does *** have family hx of AAA.  Past Medical History:  Diagnosis Date   A-fib [facility])    a. (06/04/14) TEE-DC-CV; succesful; large LA 6.2 cm   ADHD    Ascites    Athlete's foot    Chronic systolic heart failure ([place])    a. 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, unspecified CKD stage, unspecified whether long term insulin use (HCC)    Heart murmur    HTN (hypertension)    OSA (obstructive sleep apnea)    PVD (peripheral vascular disease) (HCC)    s/p great toe amputation    Past Surgical History:  Procedure  Laterality Date   ABDOMINAL AORTOGRAM W/LOWER EXTREMITY N/A 05/19/2021   Procedure: ABDOMINAL AORTOGRAM W/LOWER EXTREMITY;  Surgeon: Marty Heck, MD;  Location: [place] [facility];  Service: Cardiovascular;  Laterality: N/A;   ABDOMINAL AORTOGRAM W/LOWER EXTREMITY Right 06/11/2022   Procedure: ABDOMINAL AORTOGRAM W/LOWER EXTREMITY;  Surgeon: Marty Heck, MD;  Location: [place] [facility];  Service: Cardiovascular;  Laterality: Right;   ABDOMINAL AORTOGRAM W/LOWER EXTREMITY Left 12/10/2022   Procedure: ABDOMINAL AORTOGRAM W/LOWER EXTREMITY;  Surgeon: Marty Heck, MD;  Location: [place] [facility];  Service: Cardiovascular;  Laterality: Left;   AMPUTATION Left 05/22/2021   Procedure: LEFT FIFTH TOE AMPUTATION;  Surgeon: Marty Heck, MD;  Location: [place];  Service: Vascular;  Laterality: Left;   CARDIOVERSION N/A 06/04/2014   Procedure: CARDIOVERSION;  Surgeon: Larey Dresser, MD;  Location: [facility] ENDOSCOPY;  Service: Cardiovascular;  Laterality: N/A;   CARDIOVERSION N/A 11/06/2021   Procedure: CARDIOVERSION;  Surgeon: Larey Dresser, MD;  Location: [facility] ENDOSCOPY;  Service: Cardiovascular;  Laterality: N/A;   NOSE SURGERY     Nasal septum surgery   PERIPHERAL VASCULAR BALLOON ANGIOPLASTY  06/11/2022   Procedure: PERIPHERAL VASCULAR BALLOON ANGIOPLASTY;  Surgeon: Marty Heck, MD;  Location: [place] [facility];  Service: Cardiovascular;;   PERIPHERAL VASCULAR BALLOON ANGIOPLASTY Left 12/10/2022   Procedure: PERIPHERAL VASCULAR BALLOON ANGIOPLASTY;  Surgeon: Marty Heck, MD;  Location: [place] [facility];  Service: Cardiovascular;  Laterality: Left;  Peroneal   PERIPHERAL VASCULAR INTERVENTION Left 05/19/2021   Procedure: PERIPHERAL VASCULAR INTERVENTION;  Surgeon: Marty Heck, MD;  Location: [place] [facility];  Service: Cardiovascular;  Laterality: Left;   TEE WITHOUT CARDIOVERSION N/A 06/04/2014   Procedure: TRANSESOPHAGEAL ECHOCARDIOGRAM (TEE);   Surgeon: Larey Dresser, MD;  Location: [place];  Service: Cardiovascular;  Laterality: N/A;   TEE WITHOUT CARDIOVERSION N/A 01/06/2016   Procedure: TRANSESOPHAGEAL ECHOCARDIOGRAM (TEE);  Surgeon: Fay Records, MD;  Location: [place];  Service: Cardiovascular;  Laterality: N/A;   TEE WITHOUT CARDIOVERSION N/A 11/06/2021   Procedure: TRANSESOPHAGEAL ECHOCARDIOGRAM (TEE);  Surgeon: Larey Dresser, MD;  Location: [facility] ENDOSCOPY;  Service: Cardiovascular;  Laterality: N/A;   VASECTOMY      No Known Allergies  Current Outpatient Medications  Medication Sig Dispense Refill   APPLE CIDER VINEGAR PO Take 30 mLs by mouth 4 (four) times a week.     ASHWAGANDHA PO Take by mouth.     blood glucose meter kit and supplies Dispense based on patient and insurance preference. Use up to four times daily as directed. (FOR ICD-10 E10.9, E11.9). 1 each 0   clopidogrel (PLAVIX) 75 MG tablet Take 1 tablet (75 mg total) by mouth daily. 30 tablet 11   diclofenac Sodium (VOLTAREN) 1 % GEL Apply 1 Application topically 4 (four) times daily as needed (pain).     empagliflozin (JARDIANCE) 10 MG TABS tablet TAKE 1 TABLET(10 MG) BY MOUTH DAILY 30 tablet 5   ENTRESTO 49-51 MG TAKE 1 TABLET BY MOUTH TWICE DAILY 180 tablet 2   furosemide (LASIX) 20 MG tablet TAKE 1 TABLET(20 MG) BY MOUTH TWICE DAILY 180 tablet 0   insulin glargine (LANTUS) 100 UNIT/ML injection INJECT 8 UNITS UNDER THE SKIN DAILY 10 mL 11   Insulin Syringe-Needle U-100 (INSULIN SYRINGE 1CC/30GX5/16") 30G X 5/16" 1 ML MISC Use once daily for insulin injections. Dx:E11.22 100 each 1   levofloxacin (LEVAQUIN) 750 MG tablet Take 1 tablet (750 mg total) by mouth daily. 21 tablet 0   metoprolol succinate (TOPROL XL) 50 MG 24 hr tablet Take 1 tablet (50 mg total) by mouth daily. 90 tablet 3   Misc Natural Products (JOINT SUPPORT PO) Take 1 Dose by mouth 3 (three) times a week.     spironolactone (ALDACTONE) 25 MG tablet Take 0.5 tablets (12.5 mg total) by  mouth daily. 45 tablet 3   warfarin (COUMADIN) 6 MG tablet Take 0.5-1 tablets (3-6 mg total) by mouth See admin instructions. Take 6 mg on Mon and Fri Take 3 mg on Sun, Tues, Wed, Thurs, and Sat 30 tablet 6   No current facility-administered medications for this visit.    Family History  Problem Relation Age of Onset   Heart attack Mother        deceased   Diabetes Mother    Heart attack Sister     Social History   Socioeconomic History   Marital status: Single    Spouse name: Not on file   Number of children: Not on file   Years of education: Not on file   Highest education level: Not on file  Occupational History   Occupation: retired  Tobacco Use  Smoking status: Some Days    Years: 52.00    Types: Cigarettes, Cigars    Last attempt to quit: 05/2021    Years since quitting: 1.6    Passive exposure: Never   Smokeless tobacco: Never  Vaping Use   Vaping Use: Never used  Substance and Sexual Activity   Alcohol use: Not Currently   Drug use: Never   Sexual activity: Not on file  Other Topics Concern   Not on file  Social History Narrative   ** Merged History Encounter ** Lives in [place] by himself. Retired from [employer] and [employer] for [employer].       Tobacco use, amount per day now:   Past tobacco use, amount per day:   How many years did you use tobacco:   Alcohol use (drinks per week): N/A   Diet:   Do you drink/eat things with caffeine:   Marital status:  Divorced                                What year were you married? 2003   Do you live in a house, apartment, assisted living, condo, trailer, etc.? House   Is it one or more stories? One   How many persons live in your home?   Do you have pets in your home?( please list) N/A   Highest Level of education completed? Bachelors Degree   Current or past profession: Teacher-Special Ed   Do you exercise?  Yes                                Type and how often? Daily squats, and push ups.   Do you  have a living will? No   Do you have a DNR form?  No                                 If not, do you want to discuss one?   Do you have signed POA/HPOA forms?  No                      If so, please bring to you appointment      Do you have any difficulty bathing or dressing yourself? Bathing only if pants not shirts/not shorts.   Do you have any difficulty preparing food or eating? No   Do you have any difficulty managing your medications? No   Do you have any difficulty managing your finances? No   Do you have any difficulty affording your medications? No   Social Determinants of Health   Financial Resource Strain: Not on file  Food Insecurity: No Food Insecurity (11/10/2021)   Hunger Vital Sign    Worried About Running Out of Food in the Last Year: Never true    Ran Out of Food in the Last Year: Never true  Transportation Needs: No Transportation Needs (11/10/2021)   PRAPARE - Hydrologist (Medical): No    Lack of Transportation (Non-Medical): No  Physical Activity: Not on file  Stress: Not on file  Social Connections: Not on file  Intimate Partner Violence: Not on file     REVIEW OF SYSTEMS:  *** '[X]'$  denotes positive finding, '[ ]'$  denotes negative finding Cardiac  Comments:  Chest  pain or chest pressure:    Shortness of breath upon exertion:    Short of breath when lying flat:    Irregular heart rhythm:        Vascular    Pain in calf, thigh, or hip brought on by ambulation:    Pain in feet at night that wakes you up from your sleep:     Blood clot in your veins:    Leg swelling:         Pulmonary    Oxygen at home:    Productive cough:     Wheezing:         Neurologic    Sudden weakness in arms or legs:     Sudden numbness in arms or legs:     Sudden onset of difficulty speaking or slurred speech:    Temporary loss of vision in one eye:     Problems with dizziness:         Gastrointestinal    Blood in stool:     Vomited blood:          Genitourinary    Burning when urinating:     Blood in urine:        Psychiatric    Major depression:         Hematologic    Bleeding problems:    Problems with blood clotting too easily:        Skin    Rashes or ulcers:        Constitutional    Fever or chills:      PHYSICAL EXAMINATION:  ***  General:  WDWN in NAD; vital signs documented above Gait: Not observed HENT: WNL, normocephalic Pulmonary: normal non-labored breathing , without wheezing Cardiac: {Desc; regular/irreg:14544} HR, {With/Without:20273} carotid bruit*** Abdomen: soft, NT; aortic pulse is *** palpable Skin: {With/Without:20273} rashes Vascular Exam/Pulses:  Right Left  Radial {Exam; arterial pulse strength 0-4:30167} {Exam; arterial pulse strength 0-4:30167}  Femoral {Exam; arterial pulse strength 0-4:30167} {Exam; arterial pulse strength 0-4:30167}  Popliteal {Exam; arterial pulse strength 0-4:30167} {Exam; arterial pulse strength 0-4:30167}  DP {Exam; arterial pulse strength 0-4:30167} {Exam; arterial pulse strength 0-4:30167}  PT {Exam; arterial pulse strength 0-4:30167} {Exam; arterial pulse strength 0-4:30167}  Peroneal *** ***   Extremities: {With/Without:20273} ischemic changes, {With/Without:20273} Gangrene , {With/Without:20273} cellulitis; {With/Without:20273} open wounds Musculoskeletal: no muscle wasting or atrophy  Neurologic: A&O X 3 Psychiatric:  The pt has {Desc; normal/abnormal:11317::"Normal"} affect.   Non-Invasive Vascular Imaging:   ABI's/TBI's on 01/12/2023: Right:  *** - Great toe pressure: *** Left:  *** - Great toe pressure: ***  Arterial duplex on 01/12/2023: *** Previous ABI's/TBI's on 11/24/2022: Right:  1.19/0.59 - Great toe pressure: 103 Left:  1.09/0.35 - Great toe pressure:  61  Previous arterial duplex on 912/2023: +-----------+--------+-----+---------------+----------+---------+  RIGHT     PSV cm/sRatioStenosis       Waveform  Comments    +-----------+--------+-----+---------------+----------+---------+  CFA Distal 77                          triphasic            +-----------+--------+-----+---------------+----------+---------+  DFA       67                          biphasic             +-----------+--------+-----+---------------+----------+---------+  SFA Prox   96  triphasic            +-----------+--------+-----+---------------+----------+---------+  SFA Mid    65                          triphasic            +-----------+--------+-----+---------------+----------+---------+  SFA Distal 67                          triphasic            +-----------+--------+-----+---------------+----------+---------+  POP Prox   78                          triphasic hyperemic  +-----------+--------+-----+---------------+----------+---------+  POP Distal 76                          triphasic hyperemic  +-----------+--------+-----+---------------+----------+---------+  TP Trunk   331          50-74% stenosistriphasic            +-----------+--------+-----+---------------+----------+---------+  ATA Distal 59                          monophasic           +-----------+--------+-----+---------------+----------+---------+  PTA Distal 153                         monophasichyperemic  +-----------+--------+-----+---------------+----------+---------+  PERO Distal47                          monophasic           +-----------+--------+-----+---------------+----------+---------+   Summary:  Right: Patent posterior tibial artery status post intervention. 50-74% stenosis of right tibioperoneal trunk.      ASSESSMENT/PLAN:: 72 y.o. male here for follow up for PAD with hx of ***   -*** -continue *** -pt will f/u in *** with ***.   Leontine Locket, [facility] [phone]  Clinic MD:   Carlis Abbott

## 2023-01-12 ENCOUNTER — Telehealth (HOSPITAL_COMMUNITY): Payer: Self-pay

## 2023-01-12 ENCOUNTER — Ambulatory Visit (HOSPITAL_COMMUNITY): Payer: Medicare HMO

## 2023-01-12 NOTE — Telephone Encounter (Signed)
Called and spoke to patient's daughter to confirm/remind patient of their appointment at the [facility] on 01/12/22.   Patient reminded to bring all medications and/or complete list.  Confirmed patient has transportation. Gave directions, instructed to utilize [place] parking.  Confirmed appointment prior to ending call.

## 2023-01-12 NOTE — Progress Notes (Signed)
Patient ID: [name], male   DOB: 08/10/1951, 72 y.o.   MRN: [id number]    Advanced Heart Failure Clinic Note   PCP: [facility] Primary Cardiologist: Dr. Claiborne Billings  HPI: [name] is a 72 y.o. male with PAF, severe depression, DM2, CKD stage III, obesity, OSA and chronic systolic HF.  Admitted 6/30-05/16/14 with dyspena and LE edema. Found to be in afib with RVR. Diuresed with IV lasix and d/c weight 234 lbs.   Admitted 2/18 -01/10/2016 wit volume overload/Afib RVR  in the setting of medication compliance. Diuresed with IV lasix and transitioned to lasix 80 mg twice a day. Had TEE but  DC-CV was cancelled due to LA thrombus. Discharge weight 189 pounds.   Admitted 03/14/2016 with  A fib RVR and volume overload in the setting of medication noncompliance. Refused cath, Myoview 12/2015. EF 20% with defects noted on rest and stress images within the inferior wall and to lesser extent the septum suggest ischemic etiology. No reversibility. Restarted on HF meds. D/C with volume overload. Psychiatry met with him and he was deemed to have the ability to make decisions. Diuresed with IV lasix and transitioned to lasix 80 mg twice a day. Discharge weight was 216 pounds.   Echo 5/17 EF 15-20%  Seen in the HF clinic in 2021. EF had normalized.    Admitted 10/28/21 with increased shortness of breath and marked volume overload.  Admit weight 231 pounds. His medications were being delivered in bubbles packs and he was taking metoprolol once a day instead of twice a day and his diuretic regimen was lower. Echo repeated EF 45-50%. Placed on IV lasix and diuresed > 19 liters. Transitioned to lasix 20 mg daily. Hospital course complicated by AKI and A fib. Underwent TEE-DC-CV with conversion to NSR. Beta blocker held due to bradycardia. He was continued on amiodarone 200 mg daily. He was back in A fib on discharge.  Discharge weight 203 pounds.   Admitted 12/11/21 after mechanical fall resulting  in rib fracture 5-7 and PTX.  AC stopped due to risk of fall. Discharged 12/15/21.   Follow up 2/23, NYHA II-III, volume ok. Warfarin resumed.  In 8/23 had critical limb ischemia of RLE with non-healing ulcer -> underwent right posterior tibial angioplasty by Dr. Carlis Abbott   Today he returns for HF follow up with his daughter. Continues to deal with RLE wound and swelling. Has been following with the wound clinic for about a month. About a week ago started taking an extra dose of lasix to try to reduce LE swelling. No swelling anywhere else. Denies SOB, orthopnea or PND. Compliant with meds. Still smoking at least 20 cigars per day. Remains on warfarin and Plavix.    Cardiac Testing  - TEE (12/22): EF 35% No PFO . No thrombus.  - DC-CV (12/22)--> NSR  - Echo (12/22): EF 45-50% - Echo (2021): EF 55-60 - Echo (5/17): EF 15-20%  - Myoview 2/17 Perfusion: Next defects noted on rest and stress images within the inferior wall and to lesser extent the septum. No reversible defects. Wall Motion: Severe generalized hypokinesia. Left ventricle is dilute Left Ventricular Ejection Fraction: 20 % End diastolic volume 99991111 ml End systolic volume [id number] ml IMPRESSION: 1. 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 No evidence of ischemia. 2. Severe generalized hypokinesia with dilated left ventricle. 3. Left ventricular ejection fraction 20% 4. High-risk stress test findings*.  ROS: All systems negative except as listed  in HPI, PMH and Problem List.  SH:  Social History   Socioeconomic History   Marital status: Single    Spouse name: Not on file   Number of children: Not on file   Years of education: Not on file   Highest education level: Not on file  Occupational History   Occupation: retired  Tobacco Use   Smoking status: Some Days    Years: 52.00    Types: Cigarettes, Cigars    Last attempt to quit: 05/2021    Years since quitting: 1.6    Passive exposure: Never    Smokeless tobacco: Never  Vaping Use   Vaping Use: Never used  Substance and Sexual Activity   Alcohol use: Not Currently   Drug use: Never   Sexual activity: Not on file  Other Topics Concern   Not on file  Social History Narrative   ** Merged History Encounter ** Lives in [place] by himself. Retired from [employer] and [employer] for [employer].       Tobacco use, amount per day now:   Past tobacco use, amount per day:   How many years did you use tobacco:   Alcohol use (drinks per week): N/A   Diet:   Do you drink/eat things with caffeine:   Marital status:  Divorced                                What year were you married? 2003   Do you live in a house, apartment, assisted living, condo, trailer, etc.? House   Is it one or more stories? One   How many persons live in your home?   Do you have pets in your home?( please list) N/A   Highest Level of education completed? Bachelors Degree   Current or past profession: Teacher-Special Ed   Do you exercise?  Yes                                Type and how often? Daily squats, and push ups.   Do you have a living will? No   Do you have a DNR form?  No                                 If not, do you want to discuss one?   Do you have signed POA/HPOA forms?  No                      If so, please bring to you appointment      Do you have any difficulty bathing or dressing yourself? Bathing only if pants not shirts/not shorts.   Do you have any difficulty preparing food or eating? No   Do you have any difficulty managing your medications? No   Do you have any difficulty managing your finances? No   Do you have any difficulty affording your medications? No   Social Determinants of Health   Financial Resource Strain: Not on file  Food Insecurity: No Food Insecurity (11/10/2021)   Hunger Vital Sign    Worried About Running Out of Food in the Last Year: Never true    Ran Out of Food in the Last Year: Never true  Transportation  Needs: No Transportation Needs (11/10/2021)   PRAPARE -  Hydrologist (Medical): No    Lack of Transportation (Non-Medical): No  Physical Activity: Not on file  Stress: Not on file  Social Connections: Not on file  Intimate Partner Violence: Not on file   FH:  Family History  Problem Relation Age of Onset   Heart attack Mother        deceased   Diabetes Mother    Heart attack Sister    Past Medical History:  Diagnosis Date   A-fib [facility])    a. (06/04/14) TEE-DC-CV; succesful; large LA 6.2 cm   ADHD    Ascites    Athlete's foot    Chronic systolic heart failure ([facility])    a. 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, unspecified CKD stage, unspecified whether long term insulin use (HCC)    Heart murmur    HTN (hypertension)    OSA (obstructive sleep apnea)    PVD (peripheral vascular disease) (HCC)    s/p great toe amputation   Current Outpatient Medications  Medication Sig Dispense Refill   APPLE CIDER VINEGAR PO Take 30 mLs by mouth 4 (four) times a week.     ASHWAGANDHA PO Take by mouth.     blood glucose meter kit and supplies Dispense based on patient and insurance preference. Use up to four times daily as directed. (FOR ICD-10 E10.9, E11.9). 1 each 0   clopidogrel (PLAVIX) 75 MG tablet Take 1 tablet (75 mg total) by mouth daily. 30 tablet 11   diclofenac Sodium (VOLTAREN) 1 % GEL Apply 1 Application topically 4 (four) times daily as needed (pain).     empagliflozin (JARDIANCE) 10 MG TABS tablet TAKE 1 TABLET(10 MG) BY MOUTH DAILY 30 tablet 5   ENTRESTO 49-51 MG TAKE 1 TABLET BY MOUTH TWICE DAILY 180 tablet 2   furosemide (LASIX) 20 MG tablet TAKE 1 TABLET(20 MG) BY MOUTH TWICE DAILY 180 tablet 0   insulin glargine (LANTUS) 100 UNIT/ML injection  INJECT 8 UNITS UNDER THE SKIN DAILY 10 mL 11   Insulin Syringe-Needle U-100 (INSULIN SYRINGE 1CC/30GX5/16") 30G X 5/16" 1 ML MISC Use once daily for insulin injections. Dx:E11.22 100 each 1   levofloxacin (LEVAQUIN) 750 MG tablet Take 1 tablet (750 mg total) by mouth daily. 21 tablet 0   metoprolol succinate (TOPROL XL) 50 MG 24 hr tablet Take 1 tablet (50 mg total) by mouth daily. 90 tablet 3   Misc Natural Products (JOINT SUPPORT PO) Take 1 Dose by mouth 3 (three) times a week.     spironolactone (ALDACTONE) 25 MG tablet Take 0.5 tablets (12.5 mg total) by mouth daily. 45 tablet 3   warfarin (COUMADIN) 6 MG tablet Take 0.5-1 tablets (3-6 mg total) by mouth See admin instructions. Take 6 mg on Mon and Fri Take 3 mg on Sun, Tues, Wed, Thurs, and Sat 30 tablet 6   No current facility-administered medications for this visit.   There were no vitals taken for this visit.  Wt Readings from Last 3 Encounters:  12/30/22 98 kg (216 lb)  12/10/22 92.5 kg (204 lb)  12/09/22 95.2 kg (209 lb 12.8 oz)   PHYSICAL EXAM: General:  Well appearing. No resp difficulty HEENT: normal Neck: supple. no JVD. Carotids 2+ bilat; no bruits. No lymphadenopathy  or thryomegaly appreciated. Cor: PMI nondisplaced. Regular rate & rhythm. No rubs, gallops or murmurs. Lungs: clear Abdomen: soft, nontender, nondistended. No hepatosplenomegaly. No bruits or masses. Good bowel sounds. Extremities: no cyanosis, clubbing, rash, edema RLE wrapped with 1+ edema  malodorous drainage on dressing Neuro: alert & orientedx3, cranial nerves grossly intact. moves all 4 extremities w/o difficulty. Affect pleasant  ASSESSMENT & PLAN:  1. Chronic HFmEF - Admitted 12/22 with marked volume overload. Not taking medications as prescribed. - Myoview 12/2015. EF 20% with defects noted on rest and stress images within the inferior wall and to lesser extent the septum suggest ischemic etiology. No reversibility. Refused cath multiple times. No  s/s angina currently. HsTrop not checked on admit. ECG with nonspecific inferolateral TW abnormalities - Echo (10/21): EF 55-60%. - Echo (12/22): EF 45-50%, RV moderately reduced, RVSP 85 mmHg - Stable NYHA II-III. Volume status ok - Continue Toprol XL 25 mg daily. - Continue Lasix 20 mg bid. - Continue Entresto 49/51 mg bid. - Continue Jardiance 10 mg daily. - Continue spironolactone 12.5 mg daily.   2. CKD Stage IIIa-IIIb - Baseline Creatinine 1.5-1.7  - Mist recent labs 0n 06/18/22 Scr 1.9 - Will need repeat soon but left clinic in a hurry today to get to wound care appt  3. DMII - Hgb A1C 6.8. On SSI - Continue Jardiance   4. HTN - Blood pressure well controlled. Continue current regimen.   5. 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  - Continue warfarin. Followed by  Coumadin Clinic. - Rate ok today - ContinueToprol as above.   6.  H/o LV Thrombus - Resolved - On warfarin for AF/AFL  7. PVD - Followed by vascular and podiatry for right heel wound - ABIs (6/23) R moderate, L mild - Wound seems infected. He has f/u today in [facility]. Encouraged him to attend.    Rafael Bihari MD 8:15 AM

## 2023-01-13 ENCOUNTER — Ambulatory Visit (HOSPITAL_COMMUNITY)
Admission: RE | Admit: 2023-01-13 | Discharge: 2023-01-13 | Disposition: A | Discharge status: Home / Self Care | Payer: Medicare HMO | Source: Ambulatory Visit | Attending: Cardiology | Admitting: Cardiology

## 2023-01-13 ENCOUNTER — Encounter (HOSPITAL_COMMUNITY): Payer: Self-pay

## 2023-01-13 VITALS — BP 110/70 | HR 98 | Wt 212.8 lb

## 2023-01-13 DIAGNOSIS — I5042 Chronic combined systolic (congestive) and diastolic (congestive) heart failure: Secondary | ICD-10-CM | POA: Diagnosis not present

## 2023-01-13 DIAGNOSIS — G4733 Obstructive sleep apnea (adult) (pediatric): Secondary | ICD-10-CM | POA: Diagnosis not present

## 2023-01-13 DIAGNOSIS — E1122 Type 2 diabetes mellitus with diabetic chronic kidney disease: Secondary | ICD-10-CM | POA: Insufficient documentation

## 2023-01-13 DIAGNOSIS — Z79899 Other long term (current) drug therapy: Secondary | ICD-10-CM | POA: Diagnosis not present

## 2023-01-13 DIAGNOSIS — F32A Depression, unspecified: Secondary | ICD-10-CM | POA: Diagnosis not present

## 2023-01-13 DIAGNOSIS — I480 Paroxysmal atrial fibrillation: Secondary | ICD-10-CM | POA: Insufficient documentation

## 2023-01-13 DIAGNOSIS — E669 Obesity, unspecified: Secondary | ICD-10-CM | POA: Insufficient documentation

## 2023-01-13 DIAGNOSIS — Z8249 Family history of ischemic heart disease and other diseases of the circulatory system: Secondary | ICD-10-CM | POA: Diagnosis not present

## 2023-01-13 DIAGNOSIS — I130 Hypertensive heart and chronic kidney disease with heart failure and stage 1 through stage 4 chronic kidney disease, or unspecified chronic kidney disease: Secondary | ICD-10-CM | POA: Insufficient documentation

## 2023-01-13 DIAGNOSIS — Z7901 Long term (current) use of anticoagulants: Secondary | ICD-10-CM | POA: Diagnosis not present

## 2023-01-13 DIAGNOSIS — Z794 Long term (current) use of insulin: Secondary | ICD-10-CM | POA: Diagnosis not present

## 2023-01-13 DIAGNOSIS — I4892 Unspecified atrial flutter: Secondary | ICD-10-CM | POA: Diagnosis not present

## 2023-01-13 DIAGNOSIS — N1831 Chronic kidney disease, stage 3a: Secondary | ICD-10-CM | POA: Insufficient documentation

## 2023-01-13 DIAGNOSIS — Z7984 Long term (current) use of oral hypoglycemic drugs: Secondary | ICD-10-CM | POA: Insufficient documentation

## 2023-01-13 DIAGNOSIS — Z833 Family history of diabetes mellitus: Secondary | ICD-10-CM | POA: Insufficient documentation

## 2023-01-13 DIAGNOSIS — E1151 Type 2 diabetes mellitus with diabetic peripheral angiopathy without gangrene: Secondary | ICD-10-CM | POA: Insufficient documentation

## 2023-01-13 DIAGNOSIS — I5022 Chronic systolic (congestive) heart failure: Secondary | ICD-10-CM | POA: Insufficient documentation

## 2023-01-13 LAB — BASIC METABOLIC PANEL
Anion gap: 10 (ref 5–15)
BUN: 38 mg/dL — ABNORMAL HIGH (ref 8–23)
CO2: 24 mmol/L (ref 22–32)
Calcium: 8.9 mg/dL (ref 8.9–10.3)
Chloride: 103 mmol/L (ref 98–111)
Creatinine, Ser: 1.85 mg/dL — ABNORMAL HIGH (ref 0.61–1.24)
GFR, Estimated: 38 mL/min — ABNORMAL LOW (ref >60)
Glucose, Bld: 109 mg/dL — ABNORMAL HIGH (ref 70–99)
Potassium: 3.8 mmol/L (ref 3.5–5.1)
Sodium: 137 mmol/L (ref 135–145)

## 2023-01-13 LAB — BRAIN NATRIURETIC PEPTIDE: B Natriuretic Peptide: 227.9 pg/mL — ABNORMAL HIGH (ref 0.0–100.0)

## 2023-01-13 MED ORDER — SPIRONOLACTONE 25 MG PO TABS: 25.0000 mg | ORAL_TABLET | Freq: Every day | ORAL | 3 refills | Status: DC

## 2023-01-13 MED ORDER — FUROSEMIDE 20 MG PO TABS: 20.0000 mg | ORAL_TABLET | Freq: Two times a day (BID) | ORAL | 0 refills | Status: DC

## 2023-01-13 NOTE — Patient Instructions (Signed)
Thank you for coming in today  Labs were done today, if any labs are abnormal the clinic will call you No news is good news  Medications: Increase your lasix to 40 mg in the morning and 20 mg in the evening for 1 week  Follow up appointments:  Your physician recommends that you schedule a follow-up appointment in:  1 week for labs BNP and BMET 4 weeks in clinic    Do the following things EVERYDAY: Weigh yourself in the morning before breakfast. Write it down and keep it in a log. Take your medicines as prescribed Eat low salt foods--Limit salt (sodium) to 2000 mg per day.  Stay as active as you can everyday Limit all fluids for the day to less than 2 liters   At the [facility], you and your health needs are our priority. As part of our continuing mission to provide you with exceptional heart care, we have created designated Provider Care Teams. These Care Teams include your primary Cardiologist (physician) and Advanced Practice Providers (APPs- Physician Assistants and Nurse Practitioners) who all work together to provide you with the care you need, when you need it.   You may see any of the following providers on your designated Care Team at your next follow up: Dr Glori Bickers Dr Loralie Champagne Dr. Roxana Hires, NP Lyda Jester, [place] [facility] [place], [place] Forestine Na, NP Audry Riles, PharmD   Please be sure to bring in all your medications bottles to every appointment.    Thank you for choosing [facility]  If you have any questions or concerns before your next appointment please send [place] a message through [place] or call our office at [phone].    TO LEAVE A MESSAGE FOR THE NURSE SELECT OPTION 2, PLEASE LEAVE A MESSAGE INCLUDING: YOUR NAME DATE OF BIRTH CALL BACK NUMBER REASON FOR CALL**this is important as we prioritize the call backs  YOU WILL RECEIVE A CALL BACK THE  SAME DAY AS LONG AS YOU CALL BEFORE 4:00 PM

## 2023-01-14 ENCOUNTER — Encounter (HOSPITAL_BASED_OUTPATIENT_CLINIC_OR_DEPARTMENT_OTHER): Payer: Medicare HMO | Attending: Internal Medicine | Admitting: Internal Medicine

## 2023-01-14 DIAGNOSIS — E1151 Type 2 diabetes mellitus with diabetic peripheral angiopathy without gangrene: Secondary | ICD-10-CM | POA: Diagnosis not present

## 2023-01-14 DIAGNOSIS — L97813 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: Secondary | ICD-10-CM | POA: Diagnosis not present

## 2023-01-14 DIAGNOSIS — M86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, left ankle and foot: Secondary | ICD-10-CM | POA: Diagnosis not present

## 2023-01-14 DIAGNOSIS — E11621 Type 2 diabetes mellitus with foot ulcer: Secondary | ICD-10-CM | POA: Diagnosis not present

## 2023-01-14 DIAGNOSIS — L97522 Non-pressure chronic ulcer of other part of left foot with fat layer exposed: Secondary | ICD-10-CM | POA: Diagnosis not present

## 2023-01-14 DIAGNOSIS — E1169 Type 2 diabetes mellitus with other specified complication: Secondary | ICD-10-CM | POA: Diagnosis not present

## 2023-01-14 DIAGNOSIS — L97518 Non-pressure chronic ulcer of other part of right foot with other specified severity: Secondary | ICD-10-CM | POA: Insufficient documentation

## 2023-01-16 NOTE — Progress Notes (Signed)
[name], Darren A ([id number]) [id number] Page 1 of 9 Visit Report for 01/14/2023 Chief Complaint Document Details Patient Name: Date of Service: [name] [facility] A. 01/14/2023 12:30 PM Medical Record Number: [id number] Patient Account Number: [id number] Date of Birth/Sex: Treating RN: [birth year] (72 y.o. M) Primary Care Provider: [name] Other Clinician: Referring Provider: Treating Provider/Extender: [name], [name] in Treatment: 26 Information Obtained from: Patient Chief Complaint 07/16/2022; bilateral lower extremity wounds Electronic Signature(s) Signed: 01/14/2023 4:58:12 PM By: [name] DO Entered By: [name] on 01/14/2023 14:47:14 -------------------------------------------------------------------------------- HPI Details Patient Name: Date of Service: [name] [name] 01/14/2023 12:30 PM Medical Record Number: [id number] Patient Account Number: [id number] Date of Birth/Sex: Treating RN: 13-Dec-1950 (72 y.o. M) Primary Care Provider: [name] Other Clinician: Referring Provider: Treating Provider/Extender: [name], [name] in Treatment: 26 History of Present Illness HPI Description: Admission 07/16/2022 [name] is a 72 year old male with a past medical history of insulin-dependent currently controlled type 2 diabetes complicated by peripheral neuropathy, chronic systolic heart failure, obstructive sleep apnea, and peripheral vascular disease that presents to the clinic for a several month history of nonhealing ulcer to the back of the right leg and a 38-monthhistory of nonhealing wounds to the left foot. He is not sure how the wounds started. He has been following with podiatry for this issue. He had removal of the tendon to the right lower extremity by Dr. SBlenda Mounts He has been using Betadine to the wound beds. On 06/11/2022 he had a right posterior tibial artery  angioplasty by Dr. CCarlis Abbott It was reported the patient is optimized after revascularization of the right lower extremity. His ABIs on the left were 0.91. He currently denies systemic signs of infection. He also does not wear shoes. He came in with Kerlix wrap to his feet bilaterally. This did not completely cover his feet. 07/23/2022: This is a patient of Dr. HJodene Namthat she asked me to take a look at last week due to the significant involvement of the muscle and tendon on his right posterior leg wound. He needed an aggressive debridement and asked me if I would be able to perform this on her behalf. The patient is here today for that procedure. When his dressing was removed in clinic today, the wound was teeming with maggots. There is necrotic muscle, tendon, and fat, with a thick layer of slough on the wound. 9/21; patient presents for follow-up. He was debrided by Dr. CCeline Ahrat last clinic visit without any issues. He has been placing Dakin's wet-to-dry dressings to the right posterior wound. He has been using Medihoney to the left foot wound. He reports improvement in wound healing. He has no issues or complaints today. 9/28; patient with type 2 diabetes PAD status post revascularization. He underwent retrograde right PTA angioplasty. Last saw Dr. CCarlis Abbotton 9/12 and was felt to have a brisk posterior tibial signal at the right ankle. He had a pulsatile toe tracing that appeared adequate for healing. He has been using Santyl and Hydrofera Blue on the right Achilles area. He has a smaller area on the left fifth plantar metatarsal head They were apparently in the ER on 9/23 with swelling and discoloration above the wrap. They have a picture of the leg after the wrap was taken off which looks like that it was excessively tight superiorly 10/6; patient presents for follow-up. We have been using Santyl and Hydrofera Blue to the right lower extremity wound under  Kerlix/Coban. T the left foot we o have  been using silver alginate with Medihoney. He again comes in with no shoes. He has no issues or complaints today. 10/12; patient presents for follow-up. We have been using Santyl and Hydrofera Blue to the right lower extremity under Kerlix/Coban And silver alginate to the left foot wound. He has no issues today. He denies signs of infection. 10/19; patient presents for follow-up. We continue to use Santyl and Hydrofera Blue to the right lower extremity under Kerlix/Coban and silver alginate to the left foot wound. There is been improvement in wound healing. 10/26; patient presents for follow-up. We have been using Santyl and Hydrofera Blue to the right lower extremity under Kerlix/Coban and silver alginate to the left foot. There continues to be improvement in wound healing. Patient has no issues or complaints today. 11/2; the patient's area on the right Achilles heel looks quite healthy and is improved in terms of measurements. We have been using Hydrofera Blue. He is approved for Grafix On the left plantar foot wound over the fifth metatarsal head this tunnels over the lateral part of the met head. He is not offloading this at all [name], [name] A ([id number]) [id number] Page 2 of 9 11/16; patient presents for follow-up. He has been using a surgical shoe with an offloading felt pad to the left lateral foot along with silver alginate. He has been approved for Grafix and this is available for placement today T the right lower extremity wound. Patient Is agreeable to this. We have been using Santyl and o Hydrofera Blue under Kerlix/Coban to this area. 11/21; patient presents for follow-up. We have been doing Grafix to the right lower extremity and silver alginate to the left foot wound. He has no issues or complaints today. 11/30; patient presents for follow-up. We have been doing Grafix to the right lower extremity wound and Medihoney with Hydrofera Blue to the left  lateral foot wound. He reports pain to the left foot wound today. He did not obtain his x-ray. 12/11; patient presents for follow-up. He has been using Dakin's wet-to-dry dressings to the left plantar foot wound. We have been doing Grafix to the right lower extremity wound under compression therapy. He obtained his x-ray that showed mild periosteal elevation at the resection site with the possibility of osteomyelitis. He currently denies systemic signs of infection. He has been taking doxycycline and Augmentin without issues. He is getting his INR checked by his primary care office. 12/18; patient presents for follow-up. He has been using Dakin's wet-to-dry dressings to the left foot wound. We have been placing Grafix under compression therapy to the right lower extremity. He is currently taking doxycycline and Augmentin. He denies signs of infection. 12/26; patient presents for follow-up. He has been using Dakin's wet-to-dry dressings to the left foot. He has been off oral antibiotics for potential bone biopsy. We have been placing Grafix under compression therapy to the right lower extremity. There has been improvement in wound healing to both sites. He states he is trying to aggressively offload the left foot. He currently denies signs of infection. 1/2; patient presents for follow-up. He has been doing Dakin's wet-to-dry dressings to the left foot. He saw Dr. Blenda Mounts today and had a bone biopsy. Debridement was performed on the site. Dressing in place with Dakin's wet-to-dry. T the right leg we have been using Grafix under compression therapy. He o has no issues or complaints today. 1/9; patient presents for follow-up. We have been using  Hydrofera Blue under compression therapy to the right lower extremity wound. This is well-healing. He has been using Dakin's wet-to-dry dressings to the left lateral foot wound. He has been taking ciprofloxacin due to culture results without issues. He  was referred to infectious disease by podiatry. He does not have an appointment yet. 1/16; patient presents for follow-up. We were using collagen under Kerlix/Coban to the right lower extremity wound. He has been approved for Grafix and this was available for placement today. He continues to use Dakin's wet-to-dry dressings daily to the left foot. He started levofloxacin. He has an appointment with infectious disease on 1/24. 1/23; patient presents for follow-up. Grafix was placed in standard fashion to the right lower extremity wound last week. He has been using Dakin's wet-to-dry dressings to the left foot. He has been taking levofloxacin. He has an infectious disease appointment tomorrow as well as a pulmonology appointment. He followed up with vein and vascular on 1/16 and plan is for angiogram of the left lower extremity. He has a history of left peroneal angioplasty in 2022. 1/30; patient presents for follow-up. Grafix was placed in standard fashion to the right lower extremity wound last week. There has been improvement in healing here. He has been using Dakin's wet-to-dry dressings to the left foot. Patient saw infectious disease, Dr. Juleen China on 1/24 and plan is for PICC line on 2/5 with cefepime. For now he is taking levofloxacin. He has been cleared by pulmonology for HBO. Due to extensive heart history we have asked HBO clearance from his cardiologist as well. He has follow-up with them tomorrow. He currently has no issues or complaints today. 2/6; patient presents for follow-up. He started HBO therapy today and had no issues with his dive. He had an arteriogram on 2/1 by Dr. Carlis Abbott. He had angioplasty of the left peroneal artery. He is optimized from a vascular standpoint. We have been using Grafix to the right lower extremity wound and Dakin's wet-to-dry dressings to the left foot wound. He started his IV antibiotics yesterday. 2/13; patient presents for follow-up. We have been using  Grafix to the right lower extremity wound under compression therapy and Dakin's wet-to-dry dressings to the left lateral wound. He is continued IV antibiotics without issues. 2/20; Patient presents for follow-up. We have been using Grafix to the right lower extremity wound under compression and Dakin's wet-to-dry dressings to the left lateral foot wound. Per patient since he cannot do two courses of IV antibiotics daily plan is to switch to oral antibiotics. Patient has not done HBO since 2/15. Plan is to be evaluated by ophthalmology to assure there are no issues with his vision. 2/27; patient presents for follow-up. We have been using Grafix to the right lower extremity under compression therapy and Dakin's wet-to-dry dressings to the left lateral foot wound. He has switched over to oral antibiotics for the left foot with chronic osteomyelitis. He is scheduled to see ophthalmology later this week to be cleared for HBO. 3/7; patient presents for follow-up. We have been using Hydrofera Blue under Kerlix/Coban to the right lower extremity. We have been using Dakin's wet-to-dry dressings to the left lateral foot. This wound has healed. Electronic Signature(s) Signed: 01/14/2023 4:58:12 PM By: [name] DO Entered By: [name] on 01/14/2023 14:48:16 -------------------------------------------------------------------------------- Physical Exam Details Patient Name: Date of Service: [name] [facility] A. 01/14/2023 12:30 PM Medical Record Number: [id number] Patient Account Number: [id number] Date of Birth/Sex: Treating RN: 1950-12-14 (72 y.o. M) Primary Care  Provider: [name] Other Clinician: Referring Provider: Treating Provider/Extender: [name] Ngetich, [name] in Treatment: 26 Constitutional respirations regular, non-labored and within target range for patient.. Cardiovascular 2+ dorsalis pedis/posterior tibialis pulses. Psychiatric SAMAN, CHRZANOWSKI A  ([id number]) [id number] Page 3 of 9 pleasant and cooperative. Notes Right lower extremity: T the posterior aspect there is an open wound with granulation tissue. Venous stasis dermatitis. T the left lateral foot there Is o o epithelization to the previous wound site. Does not probe to bone. No surrounding signs of soft tissue infection. Electronic Signature(s) Signed: 01/14/2023 4:58:12 PM By: [name] DO Entered By: [name] on 01/14/2023 14:49:08 -------------------------------------------------------------------------------- Physician Orders Details Patient Name: Date of Service: [name] [facility] A. 01/14/2023 12:30 PM Medical Record Number: [id number] Patient Account Number: [id number] Date of Birth/Sex: Treating RN: [birth year] (72 y.o. [name] Primary Care Provider: [name] Other Clinician: Referring Provider: Treating Provider/Extender: [name] Ngetich, [name] in Treatment: 23 Verbal / Phone Orders: No Diagnosis Coding Follow-up Appointments ppointment in 1 week. - w/ Dr. Heber [place] and Allayne Butcher Rm # 9 01/21/23 @ 1:15 Return A Other: - NO LOTION due to hyperbarics. ****ensure you go see your eye doctor for clearance of hyperbarics. Anesthetic (In clinic) Topical Lidocaine 5% applied to wound bed Cellular or Tissue Based Products Wound #1 Right Achilles Cellular or Tissue Based Product Type: - Grafix approved-waiting to hear about copay or coinsurance-cost for you out of your pocket. Kerecsis denied by insurance. Grafix #1 applied on 09/24/22 Grafix #2 applied on 09/29/2022 Grafix # 3 applied on 10/07/22 Grafix # 4 applied on 10/19/22 Grafix # 5 applied 10/26/2022 Grafix applied to right achilles wound 11/03/2022 Grafix pending insurance approval 11/24/2022 Grafix approved from now to 02/14/2023 total 12. #7 applied. Grafix # 8 applied 12/01/22 Grafix # 9 applied 12/08/22 Grafix #10 applied to  12/15/2022 Grafix #11 applied to 12/22/2022 Grafix #12 applied to 12/29/22 Edema Control - Lymphedema / SCD / Other Avoid standing for long periods of time. Off-Loading Open toe surgical shoe to: - ****put PegAssist in surgical shoe.**** Prevalon Boot - left foot while resting in bed at night. Other: - minimize walking and standing to aid in offloading pressure to wound. Wound Treatment Wound #1 - Achilles Wound Laterality: Right Cleanser: Soap and Water 1 x Per Week/30 Days Discharge Instructions: May shower and wash wound with dial antibacterial soap and water prior to dressing change. Cleanser: Wound Cleanser 1 x Per Week/30 Days Discharge Instructions: Cleanse the wound with wound cleanser prior to applying a clean dressing using gauze sponges, not tissue or cotton balls. Topical: Gentamicin 1 x Per Week/30 Days Discharge Instructions: As directed by physician Topical: Mupirocin Ointment 1 x Per Week/30 Days Discharge Instructions: Apply Mupirocin (Bactroban) as instructed Prim Dressing: Endoform 2x2 in 1 x Per Week/30 Days ary Discharge Instructions: Moisten with saline Secondary Dressing: ABD Pad, 5x9 1 x Per Week/30 Days CONFESOR, CAPEL A ([id number]) [id number] Page 4 of 9 Discharge Instructions: Apply over primary dressing as directed. Secondary Dressing: Woven Gauze Sponge, Non-Sterile 4x4 in 1 x Per Week/30 Days Discharge Instructions: Apply over primary dressing as directed. Compression Wrap: Kerlix Roll 4.5x3.1 (in/yd) 1 x Per Week/30 Days Discharge Instructions: Apply Kerlix and Coban compression as directed. Compression Wrap: Coban Self-Adherent Wrap 4x5 (in/yd) 1 x Per Week/30 Days Discharge Instructions: Apply over Kerlix as directed. Electronic Signature(s) Signed: 01/14/2023 4:58:12 PM By: [name] DO Entered By: [name] on 01/14/2023  14:49:17 -------------------------------------------------------------------------------- Problem List Details Patient  Name: Date of Service: [name] [facility] A. 01/14/2023 12:30 PM Medical Record Number: [id number] Patient Account Number: [id number] Date of Birth/Sex: Treating RN: 1951/08/31 (72 y.o. M) Primary Care Provider: [name] Other Clinician: Referring Provider: Treating Provider/Extender: [name], [name] in Treatment: 26 Active Problems ICD-10 Encounter Code Description Active Date MDM Diagnosis L97.518 Non-pressure chronic ulcer of other part of right foot with other specified 07/16/2022 No Yes severity L97.522 Non-pressure chronic ulcer of other part of left foot with fat layer exposed 07/16/2022 No Yes E11.621 Type 2 diabetes mellitus with foot ulcer 07/16/2022 No Yes I73.9 Peripheral vascular disease, unspecified 07/16/2022 No Yes 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 07/23/2022 No Yes muscle M86.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, left ankle and foot 11/10/2022 No Yes Inactive Problems Resolved Problems Electronic Signature(s) Signed: 01/14/2023 4:58:12 PM By: [name] DO Entered By: [name] on 01/14/2023 14:46:57 Progress Note Details -------------------------------------------------------------------------------- Cordie Grice ([id number]) [id number] Page 5 of 9 Patient Name: Date of Service: [name] [facility] A. 01/14/2023 12:30 PM Medical Record Number: [id number] Patient Account Number: [id number] Date of Birth/Sex: Treating RN: October 18, 1951 (72 y.o. M) Primary Care Provider: [name] Other Clinician: Referring Provider: Treating Provider/Extender: [name], [name] in Treatment: 26 Subjective Chief Complaint Information obtained from Patient 07/16/2022; bilateral lower extremity wounds History of Present Illness  (HPI) Admission 07/16/2022 Mr. 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 He is not sure how the wounds started. He has been following with podiatry for this issue. He had removal of the tendon to the right lower extremity by Dr. SBlenda Mounts He has been using Betadine to the wound beds. On 06/11/2022 he had a right posterior tibial artery angioplasty by Dr. CCarlis Abbott It was reported the patient is optimized after revascularization of the right lower extremity. His ABIs on the left were 0.91. He currently denies systemic signs of infection. He also does not wear shoes. He came in with Kerlix wrap to his feet bilaterally. This did not completely cover his feet. 07/23/2022: This is a patient of Dr. HJodene Namthat she asked me to take a look at last week due to the significant involvement of the muscle and tendon on his right posterior leg wound. He needed an aggressive debridement and asked me if I would be able to perform this on her behalf. The patient is here today for that procedure. When his dressing was removed in clinic today, the wound was teeming with maggots. There is necrotic muscle, tendon, and fat, with a thick layer of slough on the wound. 9/21; patient presents for follow-up. He was debrided by Dr. CCeline Ahrat last clinic visit without any issues. He has been placing Dakin's wet-to-dry dressings to the right posterior wound. He has been using Medihoney to the left foot wound. He reports improvement in wound healing. He has no issues or complaints today. 9/28; patient with type 2 diabetes PAD status post revascularization. He underwent  retrograde right PTA angioplasty. Last saw Dr. CCarlis Abbotton 9/12 and was felt to have a brisk posterior tibial signal at the right ankle. He had a pulsatile  toe tracing that appeared adequate for healing. He has been using Santyl and Hydrofera Blue on the right Achilles area. oo He has a smaller area on the left fifth plantar metatarsal head They were apparently in the ER on 9/23 with swelling and discoloration above the wrap. They have a picture of the leg after the wrap was taken off which looks like that it was excessively tight superiorly 10/6; patient presents for follow-up. We have been using Santyl and Hydrofera Blue to the right lower extremity wound under Kerlix/Coban. T the left foot we o have been using silver alginate with Medihoney. He again comes in with no shoes. He has no issues or complaints today. 10/12; patient presents for follow-up. We have been using Santyl and Hydrofera Blue to the right lower extremity under Kerlix/Coban And silver alginate to the left foot wound. He has no issues today. He denies signs of infection. 10/19; patient presents for follow-up. We continue to use Santyl and Hydrofera Blue to the right lower extremity under Kerlix/Coban and silver alginate to the left foot wound. There is been improvement in wound healing. 10/26; patient presents for follow-up. We have been using Santyl and Hydrofera Blue to the right lower extremity under Kerlix/Coban and silver alginate to the left foot. There continues to be improvement in wound healing. Patient has no issues or complaints today. 11/2; the patient's area on the right Achilles heel looks quite healthy and is improved in terms of measurements. We have been using Hydrofera Blue. He is approved for Grafix On the left plantar foot wound over the fifth metatarsal head this tunnels over the lateral part of the met head. He is not offloading this at all 11/16; patient presents for follow-up. He has been using a surgical  shoe with an offloading felt pad to the left lateral foot along with silver alginate. He has been approved for Grafix and this is available for placement today T the right lower extremity wound. Patient Is agreeable to this. We have been using Santyl and o Hydrofera Blue under Kerlix/Coban to this area. 11/21; patient presents for follow-up. We have been doing Grafix to the right lower extremity and silver alginate to the left foot wound. He has no issues or complaints today. 11/30; patient presents for follow-up. We have been doing Grafix to the right lower extremity wound and Medihoney with Hydrofera Blue to the left lateral foot wound. He reports pain to the left foot wound today. He did not obtain his x-ray. 12/11; patient presents for follow-up. He has been using Dakin's wet-to-dry dressings to the left plantar foot wound. We have been doing Grafix to the right lower extremity wound under compression therapy. He obtained his x-ray that showed mild periosteal elevation at the resection site with the possibility of osteomyelitis. He currently denies systemic signs of infection. He has been taking doxycycline and Augmentin without issues. He is getting his INR checked by his primary care office. 12/18; patient presents for follow-up. He has been using Dakin's wet-to-dry dressings to the left foot wound. We have been placing Grafix under compression therapy to the right lower extremity. He is currently taking doxycycline and Augmentin. He denies signs of infection. 12/26; patient presents for follow-up. He has been using Dakin's wet-to-dry dressings to the left foot. He has been off oral antibiotics for potential bone biopsy. We have been placing Grafix under compression therapy to the right lower extremity. There has been improvement in wound healing to both sites. He states he is  trying to aggressively offload the left foot. He currently denies signs of infection. 1/2; patient presents for  follow-up. He has been doing Dakin's wet-to-dry dressings to the left foot. He saw Dr. Blenda Mounts today and had a bone biopsy. Debridement was performed on the site. Dressing in place with Dakin's wet-to-dry. T the right leg we have been using Grafix under compression therapy. He o has no issues or complaints today. 1/9; patient presents for follow-up. We have been using Hydrofera Blue under compression therapy to the right lower extremity wound. This is well-healing. He has been using Dakin's wet-to-dry dressings to the left lateral foot wound. He has been taking ciprofloxacin due to culture results without issues. He was referred to infectious disease by podiatry. He does not have an appointment yet. 1/16; patient presents for follow-up. We were using collagen under Kerlix/Coban to the right lower extremity wound. He has been approved for Grafix and this was available for placement today. He continues to use Dakin's wet-to-dry dressings daily to the left foot. He started levofloxacin. He has an appointment with COLTRANE, TIRABASSI A ([id number]) [id number] Page 6 of 9 infectious disease on 1/24. 1/23; patient presents for follow-up. Grafix was placed in standard fashion to the right lower extremity wound last week. He has been using Dakin's wet-to-dry dressings to the left foot. He has been taking levofloxacin. He has an infectious disease appointment tomorrow as well as a pulmonology appointment. He followed up with vein and vascular on 1/16 and plan is for angiogram of the left lower extremity. He has a history of left peroneal angioplasty in 2022. 1/30; patient presents for follow-up. Grafix was placed in standard fashion to the right lower extremity wound last week. There has been improvement in healing here. He has been using Dakin's wet-to-dry dressings to the left foot. Patient saw infectious disease, Dr. Juleen China on 1/24 and plan is for PICC line on 2/5 with cefepime.  For now he is taking levofloxacin. He has been cleared by pulmonology for HBO. Due to extensive heart history we have asked HBO clearance from his cardiologist as well. He has follow-up with them tomorrow. He currently has no issues or complaints today. 2/6; patient presents for follow-up. He started HBO therapy today and had no issues with his dive. He had an arteriogram on 2/1 by Dr. Carlis Abbott. He had angioplasty of the left peroneal artery. He is optimized from a vascular standpoint. We have been using Grafix to the right lower extremity wound and Dakin's wet-to-dry dressings to the left foot wound. He started his IV antibiotics yesterday. 2/13; patient presents for follow-up. We have been using Grafix to the right lower extremity wound under compression therapy and Dakin's wet-to-dry dressings to the left lateral wound. He is continued IV antibiotics without issues. 2/20; Patient presents for follow-up. We have been using Grafix to the right lower extremity wound under compression and Dakin's wet-to-dry dressings to the left lateral foot wound. Per patient since he cannot do two courses of IV antibiotics daily plan is to switch to oral antibiotics. Patient has not done HBO since 2/15. Plan is to be evaluated by ophthalmology to assure there are no issues with his vision. 2/27; patient presents for follow-up. We have been using Grafix to the right lower extremity under compression therapy and Dakin's wet-to-dry dressings to the left lateral foot wound. He has switched over to oral antibiotics for the left foot with chronic osteomyelitis. He is scheduled to see ophthalmology later this week to be cleared for  HBO. 3/7; patient presents for follow-up. We have been using Hydrofera Blue under Kerlix/Coban to the right lower extremity. We have been using Dakin's wet-to-dry dressings to the left lateral foot. This wound has healed. Patient History Information obtained from Chart. Family History Diabetes -  Mother, Heart Disease - Mother,Siblings. Social History Current every day smoker, Alcohol Use - Never, Drug Use - No History, Caffeine Use - Never. Medical History Respiratory Patient has history of Sleep Apnea - does not wear Cardiovascular Patient has history of Arrhythmia - A. Fib, Congestive Heart Failure - EF 25%, Hypertension, Peripheral Arterial Disease Denies history of Peripheral Venous Disease Endocrine Patient has history of Type II Diabetes Hospitalization/Surgery History - Angiogram 06/11/2022 Dr. Carlis Abbott [facility]. - cardioversion 11/06/2021, 2015, 2017. 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 Medical A Surgical History Notes nd Gastrointestinal Ascites Genitourinary stage III Kidney disease Objective Constitutional respirations regular, non-labored and within target range for patient.. Vitals Time Taken: 12:57 PM, Height: 68 in, Weight: 185 lbs, BMI: 28.1, Temperature: 98.7 F, Pulse: 75 bpm, Respiratory Rate: 17 breaths/min, Blood Pressure: 151/89 mmHg. Cardiovascular 2+ dorsalis pedis/posterior tibialis pulses. Psychiatric pleasant and cooperative. General Notes: Right lower extremity: T the posterior aspect there is an open wound with granulation tissue. Venous stasis dermatitis. T the left lateral foot o o there Is epithelization to the previous wound site. Does not probe to bone. No surrounding signs of soft tissue infection. Integumentary (Hair, Skin) Wound #1 status is Open. Original cause of wound was Surgical Injury. The date acquired was: 07/16/2022. The wound has been in treatment 26 weeks. The wound is located on the Right Achilles. The wound measures 0.5cm length x 0.2cm width x 0.1cm depth; 0cm^2 area and 0cm^3 volume. There is Fat Layer (Subcutaneous Tissue) exposed. There is a none present amount of drainage noted. The wound margin is distinct with the outline attached to the wound base. There is small (1-33%) red granulation within the wound bed. There is a large  (67-100%) amount of necrotic tissue within the wound bed including Eschar. The periwound skin appearance exhibited: Dry/Scaly. The periwound skin appearance did not exhibit: Callus, Crepitus, Excoriation, Induration, Rash, Scarring, YOSHIMI, CUTTLER A ([id number]) [id number] Page 7 of 9 Maceration, Atrophie Blanche, Cyanosis, Ecchymosis, Hemosiderin Staining, Mottled, Pallor, Rubor, Erythema. Wound #2 status is Healed - Epithelialized. Original cause of wound was Gradually Appeared. The date acquired was: 03/09/2022. The wound has been in treatment 26 weeks. The wound is located on the [place]. The wound measures 0cm length x 0cm width x 0cm depth; 0cm^2 area and 0cm^3 volume. There is bone and Fat Layer (Subcutaneous Tissue) exposed. There is a medium amount of serosanguineous drainage noted. The wound margin is distinct with the outline attached to the wound base. There is large (67-100%) pink, friable granulation within the wound bed. There is a small (1-33%) amount of necrotic tissue within the wound bed. The periwound skin appearance exhibited: Callus. The periwound skin appearance did not exhibit: Crepitus, Excoriation, Induration, Rash, Scarring, Dry/Scaly, Maceration, Atrophie Blanche, Cyanosis, Ecchymosis, Hemosiderin Staining, Mottled, Pallor, Rubor, Erythema. 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 I recommended continuing to pad this  area to assure that the wound does not reopen. Also to inspect this daily. The right posterior leg wound is  almost healed. He has a small area still open and I recommended antibiotic ointment with endoform under Kerlix/Coban. Follow-up in 1 week. Plan Follow-up Appointments: Return Appointment in 1 week. - w/ Dr. Heber [place] and Allayne Butcher Rm # 9 01/21/23 @ 1:15 Other: - NO LOTION due to hyperbarics. ****ensure you go see your eye doctor for clearance of hyperbarics. Anesthetic: (In clinic) Topical Lidocaine 5% applied to wound bed Cellular or Tissue Based Products: Wound #1 Right Achilles: Cellular or Tissue Based Product Type: - Grafix approved-waiting to hear about copay or coinsurance-cost for you out of your pocket. Kerecsis denied by insurance. Grafix #1 applied on 09/24/22 Grafix #2 applied on 09/29/2022 Grafix # 3 applied on 10/07/22 Grafix # 4 applied on 10/19/22 Grafix # 5 applied 10/26/2022 Grafix applied to right achilles wound 11/03/2022 Grafix pending insurance approval 11/24/2022 Grafix approved from now to 02/14/2023 total 12. #7 applied. Grafix # 8 applied 12/01/22 Grafix # 9 applied 12/08/22 Grafix #10 applied to 12/15/2022 Grafix #11 applied to 12/22/2022 Grafix #12 applied to 12/29/22 Edema Control - Lymphedema / SCD / Other: Avoid standing for long periods of time. Off-Loading: Open toe surgical shoe to: - ****put PegAssist in surgical shoe.**** Prevalon Boot - left foot while resting in bed at night. Other: - minimize walking and standing to aid in offloading pressure to wound. WOUND #1: - Achilles Wound Laterality: Right Cleanser: Soap and Water 1 x Per Week/30 Days Discharge Instructions: May shower and wash wound with dial antibacterial soap and water prior to dressing change. Cleanser: Wound Cleanser 1 x Per Week/30 Days Discharge Instructions: Cleanse the wound with wound cleanser prior to applying a clean dressing using gauze sponges, not tissue or cotton balls. Topical: Gentamicin 1 x Per Week/30 Days Discharge Instructions: As directed by physician Topical: Mupirocin Ointment 1 x  Per Week/30 Days Discharge Instructions: Apply Mupirocin (Bactroban) as instructed Prim Dressing: Endoform 2x2 in 1 x Per Week/30 Days ary Discharge Instructions: Moisten with saline Secondary Dressing: ABD Pad, 5x9 1 x Per Week/30 Days Discharge Instructions: Apply over primary dressing as directed. Secondary Dressing: Woven Gauze Sponge, Non-Sterile 4x4 in 1 x Per Week/30 Days Discharge Instructions: Apply over primary dressing as directed. Com pression Wrap: Kerlix Roll 4.5x3.1 (in/yd) 1 x Per Week/30 Days Discharge Instructions: Apply Kerlix and Coban compression as directed. Com pression Wrap: Coban Self-Adherent Wrap 4x5 (in/yd) 1 x Per Week/30 Days Discharge Instructions: Apply over Kerlix as directed. 1. Endoform with antibiotic ointment under Kerlix/Cobanooright lower extremity 2. Continue aggressive offloading to the right foot 3. Follow-up in 1 week Electronic Signature(s) Signed: 01/14/2023 4:58:12 PM By: [name] DO Entered By: [name] on 01/14/2023 14:51:56 [name] ([id number]) [id number] Page 8 of 9 -------------------------------------------------------------------------------- HxROS Details Patient Name: Date of Service: [name] [facility] A. 01/14/2023 12:30 PM Medical Record Number: [id number] Patient Account Number: [id number] Date of Birth/Sex: Treating RN: 01-Jul-1951 (72 y.o. M) Primary Care Provider: [name] Other Clinician: Referring Provider: Treating Provider/Extender: [name] Ngetich, [name] in Treatment: 26 Information Obtained From Chart Respiratory Medical History: Positive for: Sleep Apnea - does not wear Cardiovascular Medical History: Positive for: Arrhythmia - A. Fib; Congestive Heart Failure - EF 25%; Hypertension; Peripheral Arterial Disease Negative for: Peripheral Venous Disease Gastrointestinal Medical History: Past Medical History  Notes: Ascites Endocrine Medical History: Positive for: Type II Diabetes Time with diabetes: 6 years  Treated with: Insulin Blood sugar tested every day: No Genitourinary Medical History: Past Medical History Notes: stage III Kidney disease Immunizations Pneumococcal Vaccine: Received Pneumococcal Vaccination: No Implantable Devices No devices added Hospitalization / Surgery History Type of Hospitalization/Surgery Angiogram 06/11/2022 Dr. 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 01/14/2023 Medical Record Number: [id number] Patient Account Number: [id number] Date of Birth/Sex: Treating RN: March 04, 1951 (72 y.o. [name] Primary Care Provider: [name] Other Clinician: HILARY, MURDICK ([id number]) [id number] Page 9 of 9 Referring Provider: Treating Provider/Extender: [name], [name] in Treatment: 26 Diagnosis Coding ICD-10 Codes Code Description L97.518 Non-pressure chronic ulcer of other part of right foot with other specified severity L97.522 Non-pressure chronic ulcer of other part of left foot with fat layer exposed E11.621 Type 2 diabetes mellitus with foot ulcer I73.9 Peripheral vascular disease, unspecified 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  muscle M86.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, left ankle and foot Facility Procedures : CPT4 Code: [id number] Description: 99213 - WOUND CARE VISIT-LEV 3 EST PT Modifier: Quantity: 1 Physician Procedures : CPT4 Code Description Modifier [id number] - WC PHYS LEVEL 3 - EST PT ICD-10 Diagnosis Description 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

## 2023-01-16 NOTE — Progress Notes (Signed)
MOHAB, MEDNICK A ([id number]) [id number] Page 1 of 9 Visit Report for 01/14/2023 Arrival Information Details Patient Name: Date of Service: [name] [facility] A. 01/14/2023 12:30 PM Medical Record Number: [id number] Patient Account Number: [id number] Date of Birth/Sex: Treating RN: 1951-08-29 (72 y.o. [name] Primary Care Mohini Heathcock: [name] Other Clinician: Referring Marguerita Stapp: Treating Labrea Eccleston/Extender: [name] Ngetich, [name] in Treatment: 26 Visit Information History Since Last Visit Added or deleted any medications: No Patient Arrived: Cane Any new allergies or adverse reactions: No Arrival Time: 12:49 Had a fall or experienced change in No Accompanied By: daughter activities of daily living that may affect Transfer Assistance: Manual risk of falls: Patient Identification Verified: Yes Signs or symptoms of abuse/neglect since last visito No Secondary Verification Process Completed: Yes Hospitalized since last visit: No Patient Requires Transmission-Based Precautions: No Implantable device outside of the clinic excluding No Patient Has Alerts: Yes cellular tissue based products placed in the center Patient Alerts: Patient on Blood Thinner since last visit: Has Dressing in Place as Prescribed: Yes Pain Present Now: No Electronic Signature(s) Signed: 01/14/2023 4:33:02 PM By: Rhae Hammock RN Entered By: Rhae Hammock on 01/14/2023 12:49:49 -------------------------------------------------------------------------------- Clinic Level of Care Assessment Details Patient Name: Date of Service: [name] [name] [facility] A. 01/14/2023 12:30 PM Medical Record Number: [id number] Patient Account Number: [id number] Date of Birth/Sex: Treating RN: 03-04-1951 (72 y.o. Burnadette Pop, Lauren Primary Care Mitra Duling: [name] Other Clinician: Referring Lysbeth Dicola: Treating Hakiem Malizia/Extender: [name] Ngetich, [name]  in Treatment: 26 Clinic Level of Care Assessment Items TOOL 4 Quantity Score X- 1 0 Use when only an EandM is performed on FOLLOW-UP visit ASSESSMENTS - Nursing Assessment / Reassessment X- 1 10 Reassessment of Co-morbidities (includes updates in patient status) X- 1 5 Reassessment of Adherence to Treatment Plan ASSESSMENTS - Wound and Skin A ssessment / Reassessment '[]'$  - 0 Simple Wound Assessment / Reassessment - one wound X- 2 5 Complex Wound Assessment / Reassessment - multiple wounds '[]'$  - 0 Dermatologic / Skin Assessment (not related to wound area) ASSESSMENTS - Focused Assessment '[]'$  - 0 Circumferential Edema Measurements - multi extremities '[]'$  - 0 Nutritional Assessment / Counseling / Intervention '[]'$  - 0 Lower Extremity Assessment (monofilament, tuning fork, pulses) '[]'$  - 0 Peripheral Arterial Disease Assessment (using hand held doppler) ASSESSMENTS - Ostomy and/or Continence Assessment and Care '[]'$  - 0 Incontinence Assessment and Management '[]'$  - 0 Ostomy Care Assessment and Management (repouching, etc.) PROCESS - Coordination of Care '[]'$  - 0 Simple Patient / Family Education for ongoing care IVAL, PHEGLEY A ([id number]) [id number] Page 2 of 9 X- 1 20 Complex (extensive) Patient / Family Education for ongoing care X- 1 10 Staff obtains Programmer, systems, Records, T Results / Process Orders est '[]'$  - 0 Staff telephones HHA, Nursing Homes / Clarify orders / etc '[]'$  - 0 Routine Transfer to another Facility (non-emergent condition) '[]'$  - 0 Routine Hospital Admission (non-emergent condition) '[]'$  - 0 New Admissions / Biomedical engineer / Ordering NPWT Apligraf, etc. , '[]'$  - 0 Emergency Hospital Admission (emergent condition) '[]'$  - 0 Simple Discharge Coordination X- 1 15 Complex (extensive) Discharge Coordination PROCESS - Special Needs '[]'$  - 0 Pediatric / Minor Patient Management '[]'$  - 0 Isolation Patient Management '[]'$  - 0 Hearing / Language  / Visual special needs '[]'$  - 0 Assessment of Community assistance (transportation, D/C planning, etc.) '[]'$  - 0 Additional assistance / Altered mentation '[]'$  - 0 Support Surface(s) Assessment (bed, cushion, seat, etc.) INTERVENTIONS - Wound Cleansing /  Measurement '[]'$  - 0 Simple Wound Cleansing - one wound X- 2 5 Complex Wound Cleansing - multiple wounds X- 1 5 Wound Imaging (photographs - any number of wounds) '[]'$  - 0 Wound Tracing (instead of photographs) '[]'$  - 0 Simple Wound Measurement - one wound X- 2 5 Complex Wound Measurement - multiple wounds INTERVENTIONS - Wound Dressings '[]'$  - 0 Small Wound Dressing one or multiple wounds X- 1 15 Medium Wound Dressing one or multiple wounds '[]'$  - 0 Large Wound Dressing one or multiple wounds '[]'$  - 0 Application of Medications - topical '[]'$  - 0 Application of Medications - injection INTERVENTIONS - Miscellaneous '[]'$  - 0 External ear exam '[]'$  - 0 Specimen Collection (cultures, biopsies, blood, body fluids, etc.) '[]'$  - 0 Specimen(s) / Culture(s) sent or taken to Lab for analysis '[]'$  - 0 Patient Transfer (multiple staff / Civil Service fast streamer / Similar devices) '[]'$  - 0 Simple Staple / Suture removal (25 or less) '[]'$  - 0 Complex Staple / Suture removal (26 or more) '[]'$  - 0 Hypo / Hyperglycemic Management (close monitor of Blood Glucose) '[]'$  - 0 Ankle / Brachial Index (ABI) - do not check if billed separately X- 1 5 Vital Signs Has the patient been seen at the hospital within the last three years: Yes Total Score: 115 Level Of Care: New/Established - Level 3 Electronic Signature(s) Signed: 01/14/2023 4:33:02 PM By: Rhae Hammock RN Entered By: Rhae Hammock on 01/14/2023 13:26:47 Roulston, Broadus John A ([id number]) [id number] Page 3 of 9 -------------------------------------------------------------------------------- Encounter Discharge Information Details Patient Name: Date of Service: [name] [facility] A. 01/14/2023  12:30 PM Medical Record Number: [id number] Patient Account Number: [id number] Date of Birth/Sex: Treating RN: 07/15/1951 (72 y.o. [name] Primary Care Aaban Griep: [name] Other Clinician: Referring Naomi Castrogiovanni: Treating Dhani Imel/Extender: [name] Ngetich, [name] in Treatment: 45 Encounter Discharge Information Items Discharge Condition: Stable Ambulatory Status: Ambulatory Discharge Destination: Home Transportation: Private Auto Accompanied By: daughter and granddaughter Schedule Follow-up Appointment: Yes Clinical Summary of Care: Patient Declined Electronic Signature(s) Signed: 01/14/2023 4:33:02 PM By: Rhae Hammock RN Entered By: Rhae Hammock on 01/14/2023 15:17:26 -------------------------------------------------------------------------------- Lower Extremity Assessment Details Patient Name: Date of Service: [name] [facility] A. 01/14/2023 12:30 PM Medical Record Number: [id number] Patient Account Number: [id number] Date of Birth/Sex: Treating RN: 10/04/1951 (72 y.o. [name] Primary Care Heavenlee Maiorana: [name] Other Clinician: Referring Spence Soberano: Treating Jeanenne Licea/Extender: [name] Ngetich, [name] in Treatment: 26 Edema Assessment Assessed: Shirlyn Goltz: Yes] Patrice Paradise: Yes] Edema: [Left: Yes] [Right: Yes] Calf Left: Right: Point of Measurement: 39 cm From Medial Instep 40.5 cm 46 cm Ankle Left: Right: Point of Measurement: 8 cm From Medial Instep 26.5 cm 26 cm Vascular Assessment Pulses: Dorsalis Pedis Palpable: [Left:Yes] [Right:Yes] Posterior Tibial Palpable: [Left:Yes] [Right:Yes] Electronic Signature(s) Signed: 01/14/2023 4:33:02 PM By: Rhae Hammock RN Entered By: Rhae Hammock on 01/14/2023 12:50:08 -------------------------------------------------------------------------------- Multi Wound Chart Details Patient Name: Date of Service: [name] [facility] A. 01/14/2023 12:30 PM Medical Record  Number: [id number] Patient Account Number: [id number] Date of Birth/Sex: Treating RN: [birth year] (72 y.o. M) Primary Care Kayven Aldaco: [name] Other Clinician: ULYSSES, TANSIL ([id number]) [id number] Page 4 of 9 Referring Adryan Druckenmiller: Treating Nahzir Pohle/Extender: [name] Ngetich, [name] in Treatment: 26 Vital Signs Height(in): 68 Pulse(bpm): 75 Weight(lbs): 185 Blood Pressure(mmHg): 151/89 Body Mass Index(BMI): 28.1 Temperature(F): 98.7 Respiratory Rate(breaths/min): 17 [1:Photos:] [N/A:N/A] Right Achilles Left, Plantar Foot N/A Wound Location: Surgical Injury Gradually Appeared N/A Wounding Event: Diabetic Wound/Ulcer of the Lower Diabetic Wound/Ulcer  of the Lower N/A Primary Etiology: Extremity Extremity Sleep Apnea, Arrhythmia, Congestive Sleep Apnea, Arrhythmia, Congestive N/A Comorbid History: Heart Failure, Hypertension, Peripheral Heart Failure, Hypertension, Peripheral Arterial Disease, Type II Diabetes Arterial Disease, Type II Diabetes 07/16/2022 03/09/2022 N/A Date Acquired: 26 26 N/A Weeks of Treatment: Open Healed - Epithelialized N/A Wound Status: No No N/A Wound Recurrence: 0.5x0.2x0.1 0x0x0 N/A Measurements L x W x D (cm) 0 0 N/A A (cm) : rea 0 0 N/A Volume (cm) : 100.00% 100.00% N/A % Reduction in A rea: 100.00% 100.00% N/A % Reduction in Volume: Grade 2 Grade 3 N/A Classification: None Present Medium N/A Exudate A mount: N/A Serosanguineous N/A Exudate Type: N/A red, brown N/A Exudate Color: Distinct, outline attached Distinct, outline attached N/A Wound Margin: Small (1-33%) Large (67-100%) N/A Granulation A mount: Red Pink, Friable N/A Granulation Quality: Large (67-100%) Small (1-33%) N/A Necrotic A mount: Eschar N/A N/A Necrotic Tissue: Fat Layer (Subcutaneous Tissue): Yes Fat Layer (Subcutaneous Tissue): Yes N/A Exposed Structures: Fascia: No Bone: Yes Tendon: No Fascia: No Muscle:  No Tendon: No Joint: No Muscle: No Bone: No Joint: No Large (67-100%) Large (67-100%) N/A Epithelialization: Excoriation: No Callus: Yes N/A Periwound Skin Texture: Induration: No Excoriation: No Callus: No Induration: No Crepitus: No Crepitus: No Rash: No Rash: No Scarring: No Scarring: No Dry/Scaly: Yes Maceration: No N/A Periwound Skin Moisture: Maceration: No Dry/Scaly: No Atrophie Blanche: No Atrophie Blanche: No N/A Periwound Skin Color: Cyanosis: No Cyanosis: No Ecchymosis: No Ecchymosis: No Erythema: No Erythema: No Hemosiderin Staining: No Hemosiderin Staining: No Mottled: No Mottled: No Pallor: No Pallor: No Rubor: No Rubor: No Treatment Notes Electronic Signature(s) Signed: 01/14/2023 4:58:12 PM By: [name] DO Entered By: [name] on 01/14/2023 14:47:04 [name] ([id number]) [id number] Page 5 of 9 -------------------------------------------------------------------------------- Multi-Disciplinary Care Plan Details Patient Name: Date of Service: [name] [facility] A. 01/14/2023 12:30 PM Medical Record Number: [id number] Patient Account Number: [id number] Date of Birth/Sex: Treating RN: 07-Sep-1951 (72 y.o. [name] Primary Care Kenneisha Cochrane: [name] Other Clinician: Referring Shawnn Bouillon: Treating Steffi Noviello/Extender: [name] Ngetich, [name] in Treatment: 35 Active Inactive Nutrition Nursing Diagnoses: Potential for alteratiion in Nutrition/Potential for imbalanced nutrition Goals: Patient/caregiver agrees to and verbalizes understanding of need to use nutritional supplements and/or vitamins as prescribed Date Initiated: 07/16/2022 Target Resolution Date: 02/05/2023 Goal Status: Active Patient/caregiver will maintain therapeutic glucose control Date Initiated: 07/16/2022 Target Resolution Date: 02/05/2023 Goal Status: Active Interventions: Assess HgA1c results as  ordered upon admission and as needed Provide education on nutrition Treatment Activities: Education provided on Nutrition : 10/08/2022 Obtain HgA1c : 07/16/2022 Patient referred to Primary Care Physician for further nutritional evaluation : 07/16/2022 Notes: Pain, Acute or Chronic Nursing Diagnoses: Pain, acute or chronic: actual or potential Potential alteration in comfort, pain Goals: Patient will verbalize adequate pain control and receive pain control interventions during procedures as needed Date Initiated: 07/16/2022 Target Resolution Date: 02/05/2023 Goal Status: Active Interventions: Encourage patient to take pain medications as prescribed Provide education on pain management Reposition patient for comfort Treatment Activities: Administer pain control measures as ordered : 07/16/2022 Notes: Electronic Signature(s) Signed: 01/14/2023 4:33:02 PM By: Rhae Hammock RN Entered By: Rhae Hammock on 01/14/2023 13:15:40 -------------------------------------------------------------------------------- Pain Assessment Details Patient Name: Date of Service: [name] [facility] A. 01/14/2023 12:30 PM Medical Record Number: [id number] Patient Account Number: [id number] Date of Birth/Sex: Treating RN: March 06, 1951 (71 y.o. [name] Primary Care Kathlynn Swofford: [name] Other Clinician: Referring Rettie Laird: Treating Editha Bridgeforth/Extender: [name]  Ngetich, [name] in Treatment: [address] GRECO, PURSLEY A ([id number]) [id number] Page 6 of 9 Location of Pain Severity and Description of Pain Patient Has Paino No Site Locations Pain Management and Medication Current Pain Management: Electronic Signature(s) Signed: 01/14/2023 4:33:02 PM By: Rhae Hammock RN Entered By: Rhae Hammock on 01/14/2023 12:49:58 -------------------------------------------------------------------------------- Patient/Caregiver Education  Details Patient Name: Date of Service: Neal Dy A. 3/7/2024andnbsp12:30 PM Medical Record Number: [id number] Patient Account Number: [id number] Date of Birth/Gender: Treating RN: [birth year] (72 y.o. [name] Primary Care Physician: [name] Other Clinician: Referring Physician: Treating Physician/Extender: [name] in Treatment: 27 Education Assessment Education Provided To: Patient Education Topics Provided Wound/Skin Impairment: Methods: Explain/Verbal Responses: Reinforcements needed, State content correctly [employer]) Signed: 01/14/2023 4:33:02 PM By: Rhae Hammock RN Entered By: Rhae Hammock on 01/14/2023 13:15:52 -------------------------------------------------------------------------------- Wound Assessment Details Patient Name: Date of Service: [name] [facility] A. 01/14/2023 12:30 PM Medical Record Number: [id number] Patient Account Number: [id number] Date of Birth/Sex: Treating RN: 02-13-1951 (72 y.o. [name] Primary Care Shamicka Inga: [name] Other Clinician: Referring Oumou Smead: Treating Khalise Billard/Extender: [name] Ngetich, [name] in Treatment: 26 Wound Status Wound Number: 1 Primary Diabetic Wound/Ulcer of the Lower Extremity Etiology: Wound Location: Right Achilles KIMM, HODGMAN A ([id number]) [id number].pdf Page 7 of 9 Wound Open Wounding Event: Surgical Injury Status: Date Acquired: 07/16/2022 Comorbid Sleep Apnea, Arrhythmia, Congestive Heart Failure, Hypertension, Weeks Of Treatment: 26 History: Peripheral Arterial Disease, Type II Diabetes Clustered Wound: No Photos Wound Measurements Length: (cm) Width: (cm) Depth: (cm) Area: (cm) Volume: (cm) 0.5 % Reduction in Area: 100% 0.2 % Reduction in Volume: 100% 0.1 Epithelialization: Large (67-100%) 0 0 Wound Description Classification: Grade 2 Wound Margin:  Distinct, outline attached Exudate Amount: None Present Foul Odor After Cleansing: No Slough/Fibrino No Wound Bed Granulation Amount: Small (1-33%) Exposed Structure Granulation Quality: Red Fascia Exposed: No Necrotic Amount: Large (67-100%) Fat Layer (Subcutaneous Tissue) Exposed: Yes Necrotic Quality: Eschar Tendon Exposed: No Muscle Exposed: No Joint Exposed: No Bone Exposed: No Periwound Skin Texture Texture Color No Abnormalities Noted: No No Abnormalities Noted: No Callus: No Atrophie Blanche: No Crepitus: No Cyanosis: No Excoriation: No Ecchymosis: No Induration: No Erythema: No Rash: No Hemosiderin Staining: No Scarring: No Mottled: No Pallor: No Moisture Rubor: No No Abnormalities Noted: No Dry / Scaly: Yes Maceration: No Treatment Notes Wound #1 (Achilles) Wound Laterality: Right Cleanser Soap and Water Discharge Instruction: May shower and wash wound with dial antibacterial soap and water prior to dressing change. Wound Cleanser Discharge Instruction: Cleanse the wound with wound cleanser prior to applying a clean dressing using gauze sponges, not tissue or cotton balls. Peri-Wound Care Topical Gentamicin Discharge Instruction: As directed by physician Mupirocin Ointment Discharge Instruction: Apply Mupirocin (Bactroban) as instructed Primary Dressing DEREN, CAZENAVE A ([id number]) [id number] Page 8 of 9 Endoform 2x2 in Discharge Instruction: Moisten with saline Secondary Dressing ABD Pad, 5x9 Discharge Instruction: Apply over primary dressing as directed. Woven Gauze Sponge, Non-Sterile 4x4 in Discharge Instruction: Apply over primary dressing as directed. Secured With Compression Wrap Kerlix Roll 4.5x3.1 (in/yd) Discharge Instruction: Apply Kerlix and Coban compression as directed. Coban Self-Adherent Wrap 4x5 (in/yd) Discharge Instruction: Apply over Kerlix as directed. Compression  Stockings Add-Ons Electronic Signature(s) Signed: 01/14/2023 4:33:02 PM By: Rhae Hammock RN Entered By: Rhae Hammock on 01/14/2023 13:24:06 -------------------------------------------------------------------------------- Wound Assessment Details Patient Name: Date of Service: [name] [name] [facility] A. 01/14/2023 12:30 PM Medical Record Number: [id number] Patient Account Number:  [id number] Date of Birth/Sex: Treating RN: [birth year] (72 y.o. Burnadette Pop, Lauren Primary Care Avalie Oconnor: [name] Other Clinician: Referring Marialuisa Basara: Treating Porter Nakama/Extender: [name] Ngetich, [name] in Treatment: 26 Wound Status Wound Number: 2 Primary Diabetic Wound/Ulcer of the Lower Extremity Etiology: Wound Location: Left, Plantar Foot Wound Healed - Epithelialized Wounding Event: Gradually Appeared Status: Date Acquired: 03/09/2022 Comorbid Sleep Apnea, Arrhythmia, Congestive Heart Failure, Hypertension, Weeks Of Treatment: 26 History: Peripheral Arterial Disease, Type II Diabetes Clustered Wound: No Photos Wound Measurements Length: (cm) Width: (cm) Depth: (cm) Area: (cm) Volume: (cm) 0 % Reduction in Area: 100% 0 % Reduction in Volume: 100% 0 Epithelialization: Large (67-100%) 0 0 Wound Description Classification: Grade 3 Wound Margin: Distinct, outline attached Exudate Amount: Medium Exudate Type: Serosanguineous Exudate Color: red, brown Moncada, Viktor A ([id number]) Wound Bed Granulation Amount: Large (67-100%) Granulation Quality: Pink, Friable Necrotic Amount: Small (1-33%) Foul Odor After Cleansing: No Slough/Fibrino Yes [id number] Page 9 of 9 Exposed Structure Fascia Exposed: No Fat Layer (Subcutaneous Tissue) Exposed: Yes Tendon Exposed: No Muscle Exposed: No Joint Exposed: No Bone Exposed: Yes Periwound Skin Texture Texture Color No Abnormalities Noted: No No Abnormalities Noted: No Callus: Yes Atrophie  Blanche: No Crepitus: No Cyanosis: No Excoriation: No Ecchymosis: No Induration: No Erythema: No Rash: No Hemosiderin Staining: No Scarring: No Mottled: No Pallor: No Moisture Rubor: No No Abnormalities Noted: No Dry / Scaly: No Maceration: No Treatment Notes Wound #2 (Foot) Wound Laterality: Plantar, Left Cleanser Peri-Wound Care Topical Primary Dressing Secondary Dressing Secured With Compression Wrap Compression Stockings Add-Ons Electronic Signature(s) Signed: 01/14/2023 4:33:02 PM By: Rhae Hammock RN Entered By: Rhae Hammock on 01/14/2023 13:22:49 -------------------------------------------------------------------------------- Vitals Details Patient Name: Date of Service: [name] 01/14/2023 12:30 PM Medical Record Number: [id number] Patient Account Number: [id number] Date of Birth/Sex: Treating RN: [birth year] (72 y.o. Burnadette Pop, Lauren Primary Care Ryian Lynde: [name] Other Clinician: Referring Hether Anselmo: Treating Terilynn Buresh/Extender: [name] Ngetich, [name] in Treatment: 26 Vital Signs Time Taken: 12:57 Temperature (F): 98.7 Height (in): 68 Pulse (bpm): 75 Weight (lbs): 185 Respiratory Rate (breaths/min): 17 Body Mass Index (BMI): 28.1 Blood Pressure (mmHg): 151/89 Reference Range: 80 - 120 mg / dl Electronic Signature(s) Signed: 01/14/2023 4:33:02 PM By: Rhae Hammock RN Entered By: Rhae Hammock on 01/14/2023 12:58:10

## 2023-01-18 ENCOUNTER — Ambulatory Visit: Payer: Medicare HMO

## 2023-01-20 ENCOUNTER — Ambulatory Visit (INDEPENDENT_AMBULATORY_CARE_PROVIDER_SITE_OTHER): Payer: Medicare HMO | Admitting: Internal Medicine

## 2023-01-20 ENCOUNTER — Other Ambulatory Visit: Payer: Self-pay

## 2023-01-20 ENCOUNTER — Encounter: Payer: Self-pay | Admitting: Internal Medicine

## 2023-01-20 ENCOUNTER — Ambulatory Visit (HOSPITAL_COMMUNITY)
Admission: RE | Admit: 2023-01-20 | Discharge: 2023-01-20 | Disposition: A | Discharge status: Home / Self Care | Payer: Medicare HMO | Source: Ambulatory Visit | Attending: Cardiology | Admitting: Cardiology

## 2023-01-20 VITALS — BP 128/67 | HR 121 | Temp 98.0°F

## 2023-01-20 DIAGNOSIS — I5022 Chronic systolic (congestive) heart failure: Secondary | ICD-10-CM

## 2023-01-20 DIAGNOSIS — M86472 Chronic osteomyelitis with draining sinus, left ankle and foot: Secondary | ICD-10-CM | POA: Diagnosis not present

## 2023-01-20 LAB — BASIC METABOLIC PANEL
Anion gap: 6 (ref 5–15)
BUN: 45 mg/dL — ABNORMAL HIGH (ref 8–23)
CO2: 28 mmol/L (ref 22–32)
Calcium: 9.1 mg/dL (ref 8.9–10.3)
Chloride: 103 mmol/L (ref 98–111)
Creatinine, Ser: 1.68 mg/dL — ABNORMAL HIGH (ref 0.61–1.24)
GFR, Estimated: 43 mL/min — ABNORMAL LOW (ref >60)
Glucose, Bld: 108 mg/dL — ABNORMAL HIGH (ref 70–99)
Potassium: 4.6 mmol/L (ref 3.5–5.1)
Sodium: 137 mmol/L (ref 135–145)

## 2023-01-20 LAB — BRAIN NATRIURETIC PEPTIDE: B Natriuretic Peptide: 228.5 pg/mL — ABNORMAL HIGH (ref 0.0–100.0)

## 2023-01-20 NOTE — Assessment & Plan Note (Signed)
Patient has been on prolonged antibiotics since early January 2024.  Discussed that there is likely no further benefit to continued antibiotic therapy.  Hopefully this prolonged course has been adequate to eradicate infection and allow his wound to heal.  He had vascular intervention to improve blood flow to the left foot as well on 12/10/22 and has now completed 6 weeks of therapy following this procedure.  Labs today and follow up as needed.

## 2023-01-20 NOTE — Progress Notes (Signed)
[facility]  CHIEF COMPLAINT:    Follow up for osteomyelitis of left foot  SUBJECTIVE:    [name] is a 72 y.o. male with PMHx as below who presents to the clinic for OM of the left foot.   Patient here today for scheduled follow up.  He has continued to follow up with wound care.  After last visit, we were notified that he had continued to be non-adherent with IV antibiotics and PICC line was thus removed and he continued on Levaquin '750mg'$  daily instead.  He continues to take this with an anticipated end date of 01/20/23.  He has now been on antibiotics essentially since the beginning of January 2024.  He has continued to follow with wound care who report he is doing well and plan to dismiss him in the next week or two.  He states his wounds are doing well, no pain, and he is being more active.   Please see A&P for the details of today's visit and status of the patient's medical problems.   Patient's Medications  New Prescriptions   No medications on file  Previous Medications   APPLE CIDER VINEGAR PO    Take 30 mLs by mouth 4 (four) times a week.   ASHWAGANDHA PO    Take by mouth.   BLOOD GLUCOSE METER KIT AND SUPPLIES    Dispense based on patient and insurance preference. Use up to four times daily as directed. (FOR ICD-10 E10.9, E11.9).   CLOPIDOGREL (PLAVIX) 75 MG TABLET    Take 1 tablet (75 mg total) by mouth daily.   DICLOFENAC SODIUM (VOLTAREN) 1 % GEL    Apply 1 Application topically 4 (four) times daily as needed (pain).   EMPAGLIFLOZIN (JARDIANCE) 10 MG TABS TABLET    TAKE 1 TABLET(10 MG) BY MOUTH DAILY   ENTRESTO 49-51 MG    TAKE 1 TABLET BY MOUTH TWICE DAILY   FUROSEMIDE (LASIX) 20 MG TABLET    Take 1 tablet (20 mg total) by mouth 2 (two) times daily.   INSULIN GLARGINE (LANTUS) 100 UNIT/ML INJECTION    INJECT 8 UNITS UNDER THE SKIN DAILY   INSULIN SYRINGE-NEEDLE U-100 (INSULIN SYRINGE 1CC/30GX5/16") 30G X 5/16" 1 ML MISC    Use  once daily for insulin injections. Dx:E11.22   METOPROLOL SUCCINATE (TOPROL XL) 50 MG 24 HR TABLET    Take 1 tablet (50 mg total) by mouth daily.   MISC NATURAL PRODUCTS (JOINT SUPPORT PO)    Take 1 Dose by mouth 3 (three) times a week.   SPIRONOLACTONE (ALDACTONE) 25 MG TABLET    Take 1 tablet (25 mg total) by mouth daily.   WARFARIN (COUMADIN) 6 MG TABLET    Take 0.5-1 tablets (3-6 mg total) by mouth See admin instructions. Take 6 mg on Mon and Fri Take 3 mg on Sun, Tues, Wed, Thurs, and Sat  Modified Medications   No medications on file  Discontinued Medications   LEVOFLOXACIN (LEVAQUIN) 750 MG TABLET    Take 1 tablet (750 mg total) by mouth daily.      Past Medical History:  Diagnosis Date   A-fib [facility])    a. (06/04/14) TEE-DC-CV; succesful; large LA 6.2 cm   ADHD    Ascites    Athlete's foot    Chronic systolic heart failure ([place])    a. 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, unspecified CKD stage, unspecified whether long term insulin use (HCC)    Heart murmur    HTN (hypertension)    OSA (obstructive sleep apnea)    PVD (peripheral vascular disease) (HCC)    s/p great toe amputation    Social History   Tobacco Use   Smoking status: Some Days    Years: 52.00    Types: Cigarettes, Cigars    Last attempt to quit: 05/2021    Years since quitting: 1.7    Passive exposure: Never   Smokeless tobacco: Never  Vaping Use   Vaping Use: Never used  Substance Use Topics   Alcohol use: Not Currently   Drug use: Never    Family History  Problem Relation Age of Onset   Heart attack Mother        deceased   Diabetes Mother    Heart attack Sister     No Known Allergies  Review of Systems  All other systems reviewed and are negative.  Except as noted above.    OBJECTIVE:    Vitals:   01/20/23 1534  BP: 128/67  Pulse: (!) 121  Temp: 98 F (36.7 C)  TempSrc: Oral  SpO2: 96%   There is no height or weight on file to calculate BMI.  Physical Exam Constitutional:      Appearance: Normal appearance.  HENT:     Head: Normocephalic and atraumatic.  Pulmonary:     Effort: Pulmonary effort is normal. No respiratory distress.  Musculoskeletal:     Comments: Bilateral Darco boots on.  Skin:    General: Skin is warm and dry.  Neurological:     General: No focal deficit present.     Mental Status: He is alert and oriented to person, place, and time.  Psychiatric:        Mood and Affect: Mood normal.        Behavior: Behavior normal.      Labs and Microbiology:    Latest Ref Rng & Units 12/10/2022    6:27 AM 12/02/2022    2:19 PM 06/18/2022   10:29 AM  CBC  WBC 3.8 - 10.8 Thousand/uL  7.4  9.3   Hemoglobin 13.0 - 17.0 g/dL 14.3  12.7  13.1   Hematocrit 39.0 - 52.0 % 42.0  37.9  39.7   Platelets 140 - 400 Thousand/uL  161  222       Latest Ref Rng & Units 01/20/2023   11:25 AM 01/13/2023    9:50 AM 12/30/2022    1:53 PM  CMP  Glucose 70 - 99 mg/dL 108  109  161   BUN 8 - 23 mg/dL 45  38  27   Creatinine 0.61 - 1.24 mg/dL 1.68  1.85  1.41   Sodium 135 - 145 mmol/L 137  137  143   Potassium 3.5 - 5.1 mmol/L 4.6  3.8  4.1   Chloride 98 - 111 mmol/L 103  103  106   CO2 22 - 32 mmol/L '28  24  30   '$ Calcium 8.9 - 10.3 mg/dL 9.1  8.9  9.1   Total Protein 6.1 - 8.1 g/dL   7.7   Total Bilirubin 0.2 - 1.2 mg/dL   0.4   AST 10 - 35 U/L   18   ALT 9 - 46  U/L   20      No results found for this or any previous visit (from the past 240 hour(s)).  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  Discussed that there is likely no further benefit to continued antibiotic therapy.  Hopefully this prolonged course has been  adequate to eradicate infection and allow his wound to heal.  He had vascular intervention to improve blood flow to the left foot as well on 12/10/22 and has now completed 6 weeks of therapy following this procedure.  Labs today and follow up as needed.    Orders Placed This Encounter  Procedures   Sedimentation rate   C-reactive protein        Raynelle Highland for Infectious Disease [facility] 01/20/2023, 3:48 PM   I have personally spent 20 minutes involved in face-to-face and non-face-to-face activities for this patient on the day of the visit. Professional time spent includes the following activities: Preparing to see the patient (review of tests), Obtaining and/or reviewing separately obtained history (admission/discharge record), Performing a medically appropriate examination and/or evaluation , Ordering medications/tests/procedures, referring and communicating with other health care professionals, Documenting clinical information in the EMR, Independently interpreting results (not separately reported), Communicating results to the patient/family/caregiver, Counseling and educating the patient/family/caregiver and Care coordination (not separately reported).

## 2023-01-21 ENCOUNTER — Encounter (HOSPITAL_BASED_OUTPATIENT_CLINIC_OR_DEPARTMENT_OTHER): Payer: Medicare HMO | Admitting: Internal Medicine

## 2023-01-21 DIAGNOSIS — L97518 Non-pressure chronic ulcer of other part of right foot with other specified severity: Secondary | ICD-10-CM | POA: Diagnosis not present

## 2023-01-21 DIAGNOSIS — E11621 Type 2 diabetes mellitus with foot ulcer: Secondary | ICD-10-CM | POA: Diagnosis not present

## 2023-01-21 DIAGNOSIS — M86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, left ankle and foot: Secondary | ICD-10-CM | POA: Diagnosis not present

## 2023-01-21 DIAGNOSIS — L97522 Non-pressure chronic ulcer of other part of left foot with fat layer exposed: Secondary | ICD-10-CM | POA: Diagnosis not present

## 2023-01-21 DIAGNOSIS — E1169 Type 2 diabetes mellitus with other specified complication: Secondary | ICD-10-CM | POA: Diagnosis not present

## 2023-01-21 DIAGNOSIS — E1151 Type 2 diabetes mellitus with diabetic peripheral angiopathy without gangrene: Secondary | ICD-10-CM | POA: Diagnosis not present

## 2023-01-21 DIAGNOSIS — L97813 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: Secondary | ICD-10-CM

## 2023-01-21 LAB — C-REACTIVE PROTEIN: CRP: 1.4 mg/L (ref <8.0)

## 2023-01-21 LAB — SEDIMENTATION RATE: Sed Rate: 11 mm/h (ref 0–20)

## 2023-01-22 NOTE — Progress Notes (Signed)
IVIN, KONECNY [name] ([id number]) [id number] Page 1 of 8 Visit Report for 01/21/2023 Chief Complaint Document Details Patient Name: Date of Service: [name]. 01/21/2023 1:15 PM Medical Record Number: [id number] Patient Account Number: [id number] Date of Birth/Sex: Treating RN: 02/22/1951 (72 y.Darren. M) Primary Care Provider: [name] Other Clinician: Referring Provider: Treating Provider/Extender: [name], [name] in Treatment: 65 Information Obtained from: Patient Chief Complaint 07/16/2022; bilateral lower extremity wounds Electronic Signature(s) Signed: 01/21/2023 3:34:03 PM By: [name] DO Entered By: [name] on 01/21/2023 14:55:23 -------------------------------------------------------------------------------- HPI Details Patient Name: Date of Service: [name]. 01/21/2023 1:15 PM Medical Record Number: [id number] Patient Account Number: [id number] Date of Birth/Sex: Treating RN: August 22, 1951 (66 y.Darren. M) Primary Care Provider: [name] Other Clinician: Referring Provider: Treating Provider/Extender: [name], [name] in Treatment: 33 History of Present Illness HPI Description: Admission 07/16/2022 [name] is [name] of insulin-dependent currently controlled type 2 diabetes complicated by peripheral neuropathy, chronic systolic heart failure, obstructive sleep apnea, and peripheral vascular disease that presents to the clinic for [name] several month history of nonhealing ulcer to the back of the right leg and [name] 46-month history of nonhealing wounds to the left foot. He is not sure how the wounds started. He has been following with podiatry for this issue. He had removal of the tendon to the right lower extremity by Dr. Blenda Mounts. He has been using Betadine to the wound beds. On 06/11/2022 he had [name] right posterior tibial artery  angioplasty by Dr. Carlis Abbott. It was reported the patient is optimized after revascularization of the right lower extremity. His ABIs on the left were 0.91. He currently denies systemic signs of infection. He also does not wear shoes. He came in with Kerlix wrap to his feet bilaterally. This did not completely cover his feet. 07/23/2022: [name] that she asked me to take [name] look at last week due to the significant involvement of the muscle and tendon on his right posterior leg wound. He needed an aggressive debridement and asked me if I would be able to perform this on her behalf. The patient is here today for that procedure. When his dressing was removed in clinic today, the wound was teeming with maggots. There is necrotic muscle, tendon, and fat, with [name] thick layer of slough on the wound. 9/21; patient presents for follow-up. He was debrided by Dr. Celine Ahr at last clinic visit without any issues. He has been placing Dakin's wet-to-dry dressings to the right posterior wound. He has been using Medihoney to the left foot wound. He reports improvement in wound healing. He has no issues or complaints today. 9/28; patient with type 2 diabetes PAD status post revascularization. He underwent retrograde right PTA angioplasty. Last saw Dr. Carlis Abbott on 9/12 and was felt to have [name] brisk posterior tibial signal at the right ankle. He had [name] pulsatile toe tracing that appeared adequate for healing. He has been using Santyl and Hydrofera Blue on the right Achilles area. He has [name] smaller area on the left fifth plantar metatarsal head They were apparently in the ER on 9/23 with swelling and discoloration above the wrap. They have [name] picture of the leg after the wrap was taken off which looks like that it was excessively tight superiorly 10/6; patient presents for follow-up. We have been using Santyl and Hydrofera Blue to the right lower extremity wound under  Kerlix/Coban. T the left foot we Darren have  been using silver alginate with Medihoney. He again comes in with no shoes. He has no issues or complaints today. 10/12; patient presents for follow-up. We have been using Santyl and Hydrofera Blue to the right lower extremity under Kerlix/Coban And silver alginate to the left foot wound. He has no issues today. He denies signs of infection. 10/19; patient presents for follow-up. We continue to use Santyl and Hydrofera Blue to the right lower extremity under Kerlix/Coban and silver alginate to the left foot wound. There is been improvement in wound healing. 10/26; patient presents for follow-up. We have been using Santyl and Hydrofera Blue to the right lower extremity under Kerlix/Coban and silver alginate to the left foot. There continues to be improvement in wound healing. Patient has no issues or complaints today. 11/2; the patient's area on the right Achilles heel looks quite healthy and is improved in terms of measurements. We have been using Hydrofera Blue. He is approved for Grafix On the left plantar foot wound over the fifth metatarsal head this tunnels over the lateral part of the met head. He is not offloading this at all , OSTRAND [name] ([id number]) [id number] Page 2 of 8 11/16; patient presents for follow-up. He has been using [name] surgical shoe with an offloading felt pad to the left lateral foot along with silver alginate. He has been approved for Grafix and this is available for placement today T the right lower extremity wound. Patient Is agreeable to this. We have been using Santyl and Darren Hydrofera Blue under Kerlix/Coban to this area. 11/21; patient presents for follow-up. We have been doing Grafix to the right lower extremity and silver alginate to the left foot wound. He has no issues or complaints today. 11/30; patient presents for follow-up. We have been doing Grafix to the right lower extremity wound and Medihoney with Hydrofera Blue to the left  lateral foot wound. He reports pain to the left foot wound today. He did not obtain his x-ray. 12/11; patient presents for follow-up. He has been using Dakin's wet-to-dry dressings to the left plantar foot wound. We have been doing Grafix to the right lower extremity wound under compression therapy. He obtained his x-ray that showed mild periosteal elevation at the resection site with the possibility of osteomyelitis. He currently denies systemic signs of infection. He has been taking doxycycline and Augmentin without issues. He is getting his INR checked by his primary care office. 12/18; patient presents for follow-up. He has been using Dakin's wet-to-dry dressings to the left foot wound. We have been placing Grafix under compression therapy to the right lower extremity. He is currently taking doxycycline and Augmentin. He denies signs of infection. 12/26; patient presents for follow-up. He has been using Dakin's wet-to-dry dressings to the left foot. He has been off oral antibiotics for potential bone biopsy. We have been placing Grafix under compression therapy to the right lower extremity. There has been improvement in wound healing to both sites. He states he is trying to aggressively offload the left foot. He currently denies signs of infection. 1/2; patient presents for follow-up. He has been doing Dakin's wet-to-dry dressings to the left foot. He saw Dr. Blenda Mounts today and had [name] bone biopsy. Debridement was performed on the site. Dressing in place with Dakin's wet-to-dry. T the right leg we have been using Grafix under compression therapy. He Darren has no issues or complaints today. 1/9; patient presents for follow-up. We have been using  Hydrofera Blue under compression therapy to the right lower extremity wound. This is well-healing. He has been using Dakin's wet-to-dry dressings to the left lateral foot wound. He has been taking ciprofloxacin due to culture results without issues. He  was referred to infectious disease by podiatry. He does not have an appointment yet. 1/16; patient presents for follow-up. We were using collagen under Kerlix/Coban to the right lower extremity wound. He has been approved for Grafix and this was available for placement today. He continues to use Dakin's wet-to-dry dressings daily to the left foot. He started levofloxacin. He has an appointment with infectious disease on 1/24. 1/23; patient presents for follow-up. Grafix was placed in standard fashion to the right lower extremity wound last week. He has been using Dakin's wet-to-dry dressings to the left foot. He has been taking levofloxacin. He has an infectious disease appointment tomorrow as well as [name] pulmonology appointment. He followed up with vein and vascular on 1/16 and plan is for angiogram of the left lower extremity. He has [name] history of left peroneal angioplasty in 2022. 1/30; patient presents for follow-up. Grafix was placed in standard fashion to the right lower extremity wound last week. There has been improvement in healing here. He has been using Dakin's wet-to-dry dressings to the left foot. Patient saw infectious disease, Dr. Juleen China on 1/24 and plan is for PICC line on 2/5 with cefepime. For now he is taking levofloxacin. He has been cleared by pulmonology for HBO. Due to extensive heart history we have asked HBO clearance from his cardiologist as well. He has follow-up with them tomorrow. He currently has no issues or complaints today. 2/6; patient presents for follow-up. He started HBO therapy today and had no issues with his dive. He had an arteriogram on 2/1 by Dr. Carlis Abbott. He had angioplasty of the left peroneal artery. He is optimized from [name] vascular standpoint. We have been using Grafix to the right lower extremity wound and Dakin's wet-to-dry dressings to the left foot wound. He started his IV antibiotics yesterday. 2/13; patient presents for follow-up. We have been using  Grafix to the right lower extremity wound under compression therapy and Dakin's wet-to-dry dressings to the left lateral wound. He is continued IV antibiotics without issues. 2/20; Patient presents for follow-up. We have been using Grafix to the right lower extremity wound under compression and Dakin's wet-to-dry dressings to the left lateral foot wound. Per patient since he cannot do two courses of IV antibiotics daily plan is to switch to oral antibiotics. Patient has not done HBO since 2/15. Plan is to be evaluated by ophthalmology to assure there are no issues with his vision. 2/27; patient presents for follow-up. We have been using Grafix to the right lower extremity under compression therapy and Dakin's wet-to-dry dressings to the left lateral foot wound. He has switched over to oral antibiotics for the left foot with chronic osteomyelitis. He is scheduled to see ophthalmology later this week to be cleared for HBO. 3/7; patient presents for follow-up. We have been using Hydrofera Blue under Kerlix/Coban to the right lower extremity. We have been using Dakin's wet-to-dry dressings to the left lateral foot. This wound has healed. 3/14; patient presents for follow-up. We have been using endoform with antibiotic ointment under Kerlix/Coban to the right lower extremity. His wound is healed. Electronic Signature(s) Signed: 01/21/2023 3:34:03 PM By: [name] DO Entered By: [name] on 01/21/2023 14:55:51 -------------------------------------------------------------------------------- Physical Exam Details Patient Name: Date of Service: Darren Downer DRO [name]  [name] 01/21/2023 1:15 PM Medical Record Number: [id number] Patient Account Number: [id number] Date of Birth/Sex: Treating RN: 15-Nov-1950 (87 y.Darren. M) Primary Care Provider: [name] Other Clinician: Referring Provider: Treating Provider/Extender: [name] Ngetich, [name] in Treatment:  56 Constitutional respirations regular, non-labored and within target range for patient.. Cardiovascular 2+ dorsalis pedis/posterior tibialis pulses. [name], [name] ([id number]) [id number] Page 3 of 8 Psychiatric pleasant and cooperative. Notes Right lower extremity: T the posterior aspect there is epithelization to the previous wound site. Some developed devitalized tissue as well. The left lateral foot Darren wound remains closed. No signs of surrounding infection. Electronic Signature(s) Signed: 01/21/2023 3:34:03 PM By: [name] DO Entered By: [name] on 01/21/2023 14:56:37 -------------------------------------------------------------------------------- Physician Orders Details Patient Name: Date of Service: Darren Nip [facility] [name]. 01/21/2023 1:15 PM Medical Record Number: [id number] Patient Account Number: [id number] Date of Birth/Sex: Treating RN: Apr 15, 1951 (75 y.Darren. [name] Primary Care Provider: [name] Other Clinician: Referring Provider: Treating Provider/Extender: [name] Ngetich, [name] in Treatment: 94 Verbal / Phone Orders: No Diagnosis Coding Discharge From [facility] Services Discharge from [place] Edema Control - Lymphedema / SCD / Other Elevate legs to the level of the heart or above for 30 minutes daily and/or when sitting for 3-4 times [name] day throughout the day. Avoid standing for long periods of time. Patient to wear own compression stockings every day. Electronic Signature(s) Signed: 01/21/2023 3:34:03 PM By: [name] DO Entered By: [name] on 01/21/2023 14:56:44 -------------------------------------------------------------------------------- Problem List Details Patient Name: Date of Service: [name] [name] [facility] [name]. 01/21/2023 1:15 PM Medical Record Number: [id number] Patient Account Number: [id number] Date of Birth/Sex: Treating RN: 05-25-1951 (53 y.Darren.  M) Primary Care Provider: [name] Other Clinician: Referring Provider: Treating Provider/Extender: [name], [name] in Treatment: 27 Active Problems ICD-10 Encounter Code Description Active Date MDM Diagnosis L97.518 Non-pressure chronic ulcer of other part of right foot with other specified 07/16/2022 No Yes severity L97.522 Non-pressure chronic ulcer of other part of left foot with fat layer exposed 07/16/2022 No Yes E11.621 Type 2 diabetes mellitus with foot ulcer 07/16/2022 No Yes I73.9 Peripheral vascular disease, unspecified 07/16/2022 No Yes 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 07/23/2022 No Yes [name], [name] ([id number]) [id number] Page 4 of 8 muscle [phone] Other chronic osteomyelitis, left ankle and foot 11/10/2022 No Yes Inactive Problems Resolved Problems Electronic Signature(s) Signed: 01/21/2023 3:34:03 PM By: [name] DO Entered By: [name] on 01/21/2023 14:55:07 -------------------------------------------------------------------------------- Progress Note Details Patient Name: Date of Service: [name]. 01/21/2023 1:15 PM Medical Record Number: [id number] Patient Account Number: [id number] Date of Birth/Sex: Treating RN: [birth year] (72 y.Darren. M) Primary Care Provider: [name] Other Clinician: Referring Provider: Treating Provider/Extender: [name], [name] in Treatment: 59 Subjective Chief Complaint Information obtained from Patient 07/16/2022; bilateral lower extremity wounds History of Present Illness (HPI) Admission 07/16/2022 Mr. Cylas Belmont is [name] of insulin-dependent currently controlled type 2 diabetes complicated by peripheral neuropathy, chronic systolic heart failure, obstructive sleep apnea, and peripheral vascular disease that presents to the clinic for [name]  several month history of nonhealing ulcer to the back of the right leg and [name] 84-month history of nonhealing wounds to the left foot. He is not sure how the wounds started. He has been following with podiatry for this issue. He had removal of the tendon to  the right lower extremity by Dr. Blenda Mounts. He has been using Betadine to the wound beds. On 06/11/2022 he had [name] right posterior tibial artery angioplasty by Dr. Carlis Abbott. It was reported the patient is optimized after revascularization of the right lower extremity. His ABIs on the left were 0.91. He currently denies systemic signs of infection. He also does not wear shoes. He came in with Kerlix wrap to his feet bilaterally. This did not completely cover his feet. 07/23/2022: [name] that she asked me to take [name] look at last week due to the significant involvement of the muscle and tendon on his right posterior leg wound. He needed an aggressive debridement and asked me if I would be able to perform this on her behalf. The patient is here today for that procedure. When his dressing was removed in clinic today, the wound was teeming with maggots. There is necrotic muscle, tendon, and fat, with [name] thick layer of slough on the wound. 9/21; patient presents for follow-up. He was debrided by Dr. Celine Ahr at last clinic visit without any issues. He has been placing Dakin's wet-to-dry dressings to the right posterior wound. He has been using Medihoney to the left foot wound. He reports improvement in wound healing. He has no issues or complaints today. 9/28; patient with type 2 diabetes PAD status post revascularization. He underwent retrograde right PTA angioplasty. Last saw Dr. Carlis Abbott on 9/12 and was felt to have [name] brisk posterior tibial signal at the right ankle. He had [name] pulsatile toe tracing that appeared adequate for healing. He has been using Santyl and Hydrofera Blue on the right Achilles area. oo He has [name] smaller area on the left  fifth plantar metatarsal head They were apparently in the ER on 9/23 with swelling and discoloration above the wrap. They have [name] picture of the leg after the wrap was taken off which looks like that it was excessively tight superiorly 10/6; patient presents for follow-up. We have been using Santyl and Hydrofera Blue to the right lower extremity wound under Kerlix/Coban. T the left foot we Darren have been using silver alginate with Medihoney. He again comes in with no shoes. He has no issues or complaints today. 10/12; patient presents for follow-up. We have been using Santyl and Hydrofera Blue to the right lower extremity under Kerlix/Coban And silver alginate to the left foot wound. He has no issues today. He denies signs of infection. 10/19; patient presents for follow-up. We continue to use Santyl and Hydrofera Blue to the right lower extremity under Kerlix/Coban and silver alginate to the left foot wound. There is been improvement in wound healing. 10/26; patient presents for follow-up. We have been using Santyl and Hydrofera Blue to the right lower extremity under Kerlix/Coban and silver alginate to the left foot. There continues to be improvement in wound healing. Patient has no issues or complaints today. 11/2; the patient's area on the right Achilles heel looks quite healthy and is improved in terms of measurements. We have been using Hydrofera Blue. He is approved for Grafix On the left plantar foot wound over the fifth metatarsal head this tunnels over the lateral part of the met head. He is not offloading this at all 11/16; patient presents for follow-up. He has been using [name] surgical shoe with an offloading felt pad to the left lateral foot along with silver alginate. He has been approved for Grafix and this is available for placement today T the right lower  extremity wound. Patient Is agreeable to this. We have been using Santyl and Darren Hydrofera Blue under Kerlix/Coban to this  area. [name], [name] [id number] Page 5 of 8 11/21; patient presents for follow-up. We have been doing Grafix to the right lower extremity and silver alginate to the left foot wound. He has no issues or complaints today. 11/30; patient presents for follow-up. We have been doing Grafix to the right lower extremity wound and Medihoney with Hydrofera Blue to the left lateral foot wound. He reports pain to the left foot wound today. He did not obtain his x-ray. 12/11; patient presents for follow-up. He has been using Dakin's wet-to-dry dressings to the left plantar foot wound. We have been doing Grafix to the right lower extremity wound under compression therapy. He obtained his x-ray that showed mild periosteal elevation at the resection site with the possibility of osteomyelitis. He currently denies systemic signs of infection. He has been taking doxycycline and Augmentin without issues. He is getting his INR checked by his primary care office. 12/18; patient presents for follow-up. He has been using Dakin's wet-to-dry dressings to the left foot wound. We have been placing Grafix under compression therapy to the right lower extremity. He is currently taking doxycycline and Augmentin. He denies signs of infection. 12/26; patient presents for follow-up. He has been using Dakin's wet-to-dry dressings to the left foot. He has been off oral antibiotics for potential bone biopsy. We have been placing Grafix under compression therapy to the right lower extremity. There has been improvement in wound healing to both sites. He states he is trying to aggressively offload the left foot. He currently denies signs of infection. 1/2; patient presents for follow-up. He has been doing Dakin's wet-to-dry dressings to the left foot. He saw Dr. Blenda Mounts today and had [name] bone biopsy. Debridement was performed on the site. Dressing in place with Dakin's wet-to-dry. T the right leg we  have been using Grafix under compression therapy. He Darren has no issues or complaints today. 1/9; patient presents for follow-up. We have been using Hydrofera Blue under compression therapy to the right lower extremity wound. This is well-healing. He has been using Dakin's wet-to-dry dressings to the left lateral foot wound. He has been taking ciprofloxacin due to culture results without issues. He was referred to infectious disease by podiatry. He does not have an appointment yet. 1/16; patient presents for follow-up. We were using collagen under Kerlix/Coban to the right lower extremity wound. He has been approved for Grafix and this was available for placement today. He continues to use Dakin's wet-to-dry dressings daily to the left foot. He started levofloxacin. He has an appointment with infectious disease on 1/24. 1/23; patient presents for follow-up. Grafix was placed in standard fashion to the right lower extremity wound last week. He has been using Dakin's wet-to-dry dressings to the left foot. He has been taking levofloxacin. He has an infectious disease appointment tomorrow as well as [name] pulmonology appointment. He followed up with vein and vascular on 1/16 and plan is for angiogram of the left lower extremity. He has [name] history of left peroneal angioplasty in 2022. 1/30; patient presents for follow-up. Grafix was placed in standard fashion to the right lower extremity wound last week. There has been improvement in healing here. He has been using Dakin's wet-to-dry dressings to the left foot. Patient saw infectious disease, Dr. Juleen China on 1/24 and plan is for PICC line on 2/5 with cefepime. For now he is taking  levofloxacin. He has been cleared by pulmonology for HBO. Due to extensive heart history we have asked HBO clearance from his cardiologist as well. He has follow-up with them tomorrow. He currently has no issues or complaints today. 2/6; patient presents for follow-up. He started HBO  therapy today and had no issues with his dive. He had an arteriogram on 2/1 by Dr. Carlis Abbott. He had angioplasty of the left peroneal artery. He is optimized from [name] vascular standpoint. We have been using Grafix to the right lower extremity wound and Dakin's wet-to-dry dressings to the left foot wound. He started his IV antibiotics yesterday. 2/13; patient presents for follow-up. We have been using Grafix to the right lower extremity wound under compression therapy and Dakin's wet-to-dry dressings to the left lateral wound. He is continued IV antibiotics without issues. 2/20; Patient presents for follow-up. We have been using Grafix to the right lower extremity wound under compression and Dakin's wet-to-dry dressings to the left lateral foot wound. Per patient since he cannot do two courses of IV antibiotics daily plan is to switch to oral antibiotics. Patient has not done HBO since 2/15. Plan is to be evaluated by ophthalmology to assure there are no issues with his vision. 2/27; patient presents for follow-up. We have been using Grafix to the right lower extremity under compression therapy and Dakin's wet-to-dry dressings to the left lateral foot wound. He has switched over to oral antibiotics for the left foot with chronic osteomyelitis. He is scheduled to see ophthalmology later this week to be cleared for HBO. 3/7; patient presents for follow-up. We have been using Hydrofera Blue under Kerlix/Coban to the right lower extremity. We have been using Dakin's wet-to-dry dressings to the left lateral foot. This wound has healed. 3/14; patient presents for follow-up. We have been using endoform with antibiotic ointment under Kerlix/Coban to the right lower extremity. His wound is healed. Patient History Information obtained from Chart. Family History Diabetes - Mother, Heart Disease - Mother,Siblings. Social History Current every day smoker, Alcohol Use - Never, Drug Use - No History, Caffeine Use -  Never. Medical History Respiratory Patient has history of Sleep Apnea - does not wear Cardiovascular Patient has history of Arrhythmia - [name]. Fib, Congestive Heart Failure - EF 25%, Hypertension, Peripheral Arterial Disease Denies history of Peripheral Venous Disease Endocrine Patient has history of Type II Diabetes Hospitalization/Surgery History - Angiogram 06/11/2022 Dr. Carlis Abbott [facility]. - cardioversion 11/06/2021, 2015, 2017. 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 Medical [name] Surgical History Notes nd Gastrointestinal Ascites Genitourinary stage III Kidney disease [name], [name] ([id number]) [id number] Page 6 of 8 Objective Constitutional respirations regular, non-labored and within target range for patient.. Vitals Time Taken: 1:35 PM, Height: 68 in, Weight: 185 lbs, BMI: 28.1, Temperature: 96.6 F, Pulse: 125 bpm, Respiratory Rate: 18 breaths/min, Blood Pressure: 118/76 mmHg. Cardiovascular 2+ dorsalis pedis/posterior tibialis pulses. Psychiatric pleasant and cooperative. General Notes: Right lower extremity: T the posterior aspect there is epithelization to the previous wound site. Some developed devitalized tissue as well. The Darren left lateral foot wound remains closed. No signs of surrounding infection. Integumentary (Hair, Skin) Wound #1 status is Healed - Epithelialized. Original cause of wound was Surgical Injury. The date acquired was: 07/16/2022. The wound has been in treatment 27 weeks. The wound is located on the Right Achilles. The wound measures 0cm length x 0cm width x 0cm depth; 0cm^2 area and 0cm^3 volume. There is Fat Layer (Subcutaneous Tissue) exposed. There is [name] none present amount of drainage  noted. The wound margin is distinct with the outline attached to the wound base. There is small (1-33%) red granulation within the wound bed. There is [name] large (67-100%) amount of necrotic tissue within the wound bed including Eschar. The periwound skin  appearance exhibited: Dry/Scaly. The periwound skin appearance did not exhibit: Callus, Crepitus, Excoriation, Induration, Rash, Scarring, Maceration, Atrophie Blanche, Cyanosis, Ecchymosis, Hemosiderin Staining, Mottled, Pallor, Rubor, Erythema. 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 His wound is healed. I recommended he wear compression stockings daily. We gave him Tubigrip in office. Plan Discharge From [facility] Services: Discharge from [place] Edema Control - Lymphedema / SCD / Other: Elevate legs to the level of the heart or above for 30 minutes daily and/or when sitting for 3-4 times [name] day throughout the day. Avoid standing for long periods of time. Patient to wear own compression stockings every day. 1. Discharge from clinic due to closed wound 2. Compression stockings daily 3. Follow-up as needed Electronic Signature(s) Signed: 01/21/2023 3:34:03 PM By: [name] DO Entered By: [name] on 01/21/2023 14:57:41 -------------------------------------------------------------------------------- HxROS Details Patient Name: Date of Service: [name]. 01/21/2023 1:15 PM Medical Record Number: [id number] Patient Account Number: [id number] Date of Birth/Sex: Treating RN: 11/01/1951 (54 y.Darren. [name], [name] ([id number]) [id number] Page 7 of 8 Primary Care Provider: [name] Other Clinician: Referring Provider: Treating Provider/Extender: [name] Ngetich,  [name] in Treatment: 27 Information Obtained From Chart Respiratory Medical History: Positive for: Sleep Apnea - does not wear Cardiovascular Medical History: Positive for: Arrhythmia - [name]. Fib; Congestive Heart Failure - EF 25%; Hypertension; Peripheral Arterial Disease Negative for: Peripheral Venous Disease Gastrointestinal Medical History: Past Medical History Notes: Ascites Endocrine Medical History: Positive for: Type II Diabetes Time with diabetes: 6 years Treated with: Insulin Blood sugar tested every day: No Genitourinary Medical History: Past Medical History Notes: stage III Kidney disease Immunizations Pneumococcal Vaccine: Received Pneumococcal Vaccination: No Implantable Devices No devices added Hospitalization / Surgery History Type of Hospitalization/Surgery Angiogram 06/11/2022 Dr. 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 01/21/2023 Medical Record Number: [id number] Patient Account Number: [id number] Date of Birth/Sex: Treating RN: [birth year] (59 y.Darren. [name] Primary Care Provider: [name] Other Clinician: Referring Provider: Treating Provider/Extender: [name], [name] in Treatment: [address] [name] [address] AUDEN, COFFING [name] ([id number]) [id number] Page 8 of 8 ICD-10  Codes Code Description L97.518 Non-pressure chronic ulcer of other part of right foot with other specified severity L97.522 Non-pressure chronic ulcer of other part of left foot with fat layer exposed E11.621 Type 2 diabetes mellitus with foot ulcer I73.9 Peripheral vascular disease, unspecified L97.813 Non-pressure  chronic ulcer of other part of right lower leg with necrosis of muscle M86.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, left ankle and foot Facility Procedures : CPT4 Code: [id number] Description: 99213 - WOUND CARE VISIT-LEV 3 EST PT Modifier: Quantity: 1 Physician Procedures : CPT4 Code Description Modifier [id number] 99213 - WC PHYS LEVEL 3 - EST PT ICD-10 Diagnosis Description 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

## 2023-01-25 ENCOUNTER — Ambulatory Visit: Payer: Medicare HMO | Attending: Internal Medicine | Admitting: Pharmacist

## 2023-01-25 DIAGNOSIS — I483 Typical atrial flutter: Secondary | ICD-10-CM

## 2023-01-25 DIAGNOSIS — Z5181 Encounter for therapeutic drug level monitoring: Secondary | ICD-10-CM | POA: Diagnosis not present

## 2023-01-25 DIAGNOSIS — I513 Intracardiac thrombosis, not elsewhere classified: Secondary | ICD-10-CM

## 2023-01-25 DIAGNOSIS — I4891 Unspecified atrial fibrillation: Secondary | ICD-10-CM

## 2023-01-25 LAB — POCT INR: INR: 3.6 — AB (ref 2.0–3.0)

## 2023-01-25 NOTE — Patient Instructions (Addendum)
Description   Hold dose tomorrow and then continue 1/2 tablet daily except for 1 tablet on Mondays, Wednesdays, and Fridays.  Recheck INR 2 weeks.  [facility] [phone].

## 2023-02-01 IMAGING — DX DG KNEE 1-2V*R*
3 series · 3 of 3 positions shown · non-contrast
Comparison: None.

CLINICAL DATA: Persistent right knee pain times multiple days

EXAM:
RIGHT KNEE - 1-2 VIEW

[knee ap (1 of 2)]
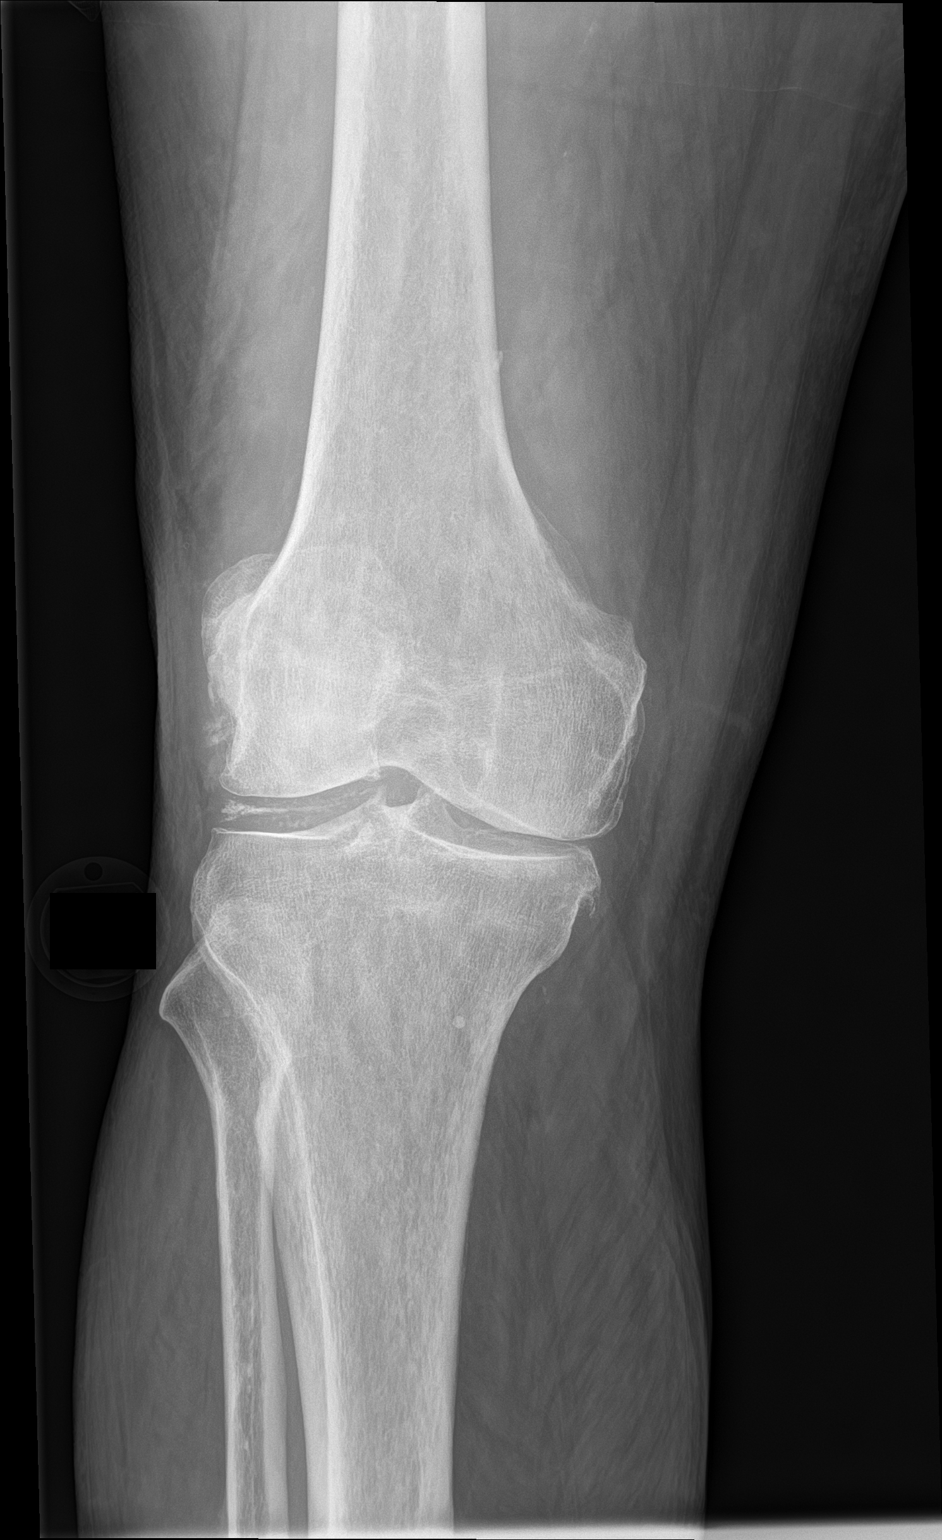

[knee ap (2 of 2)]
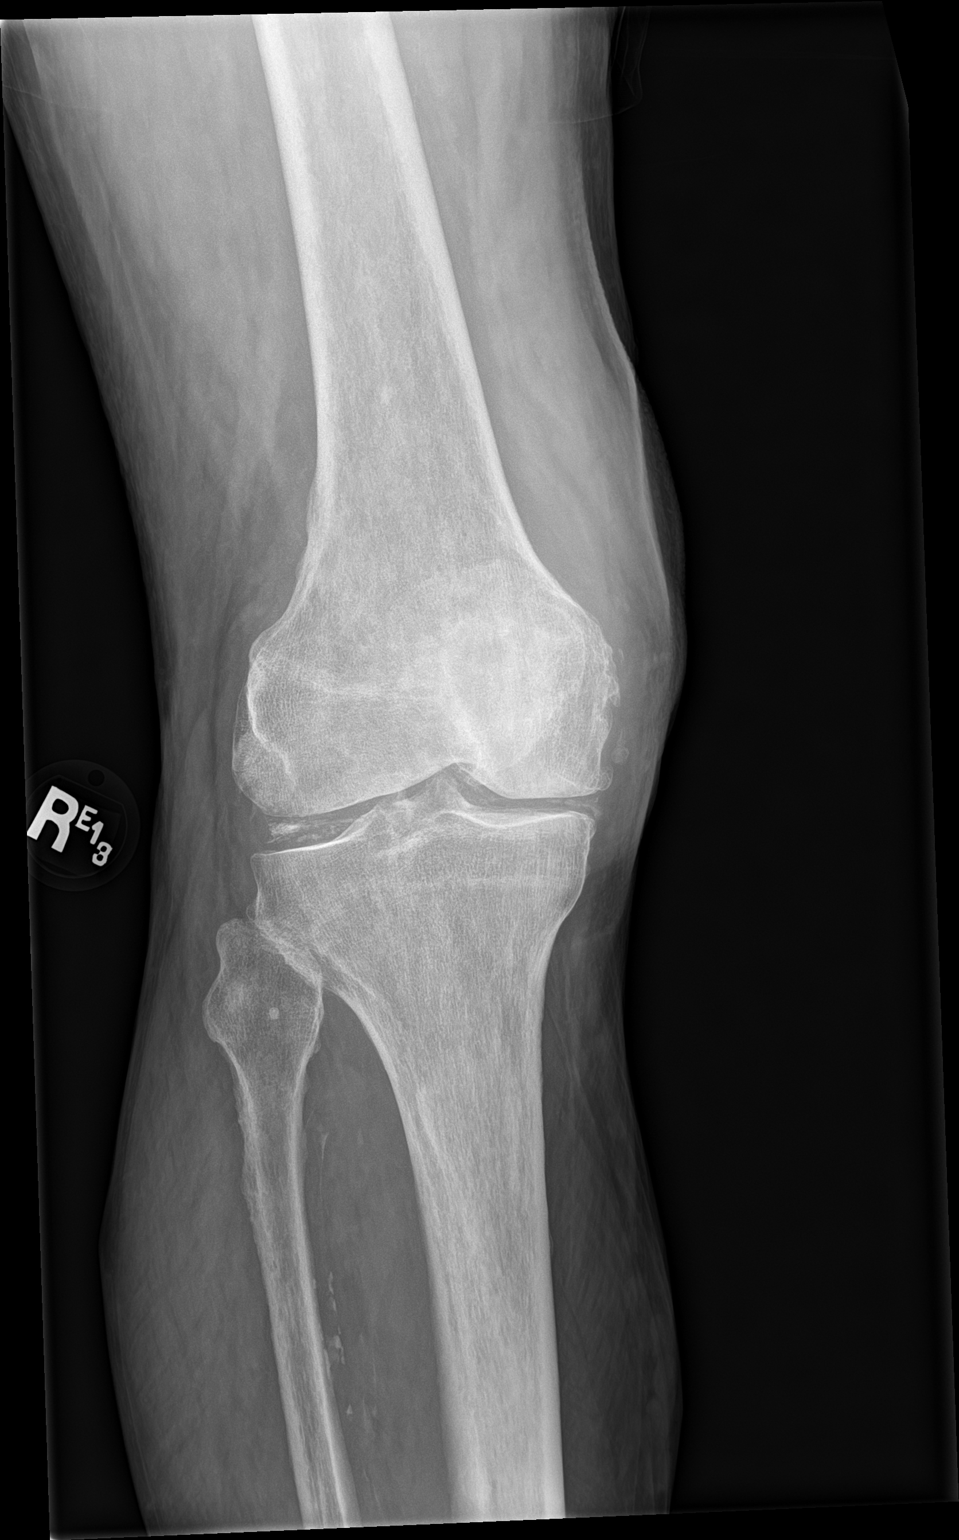

[knee lat]
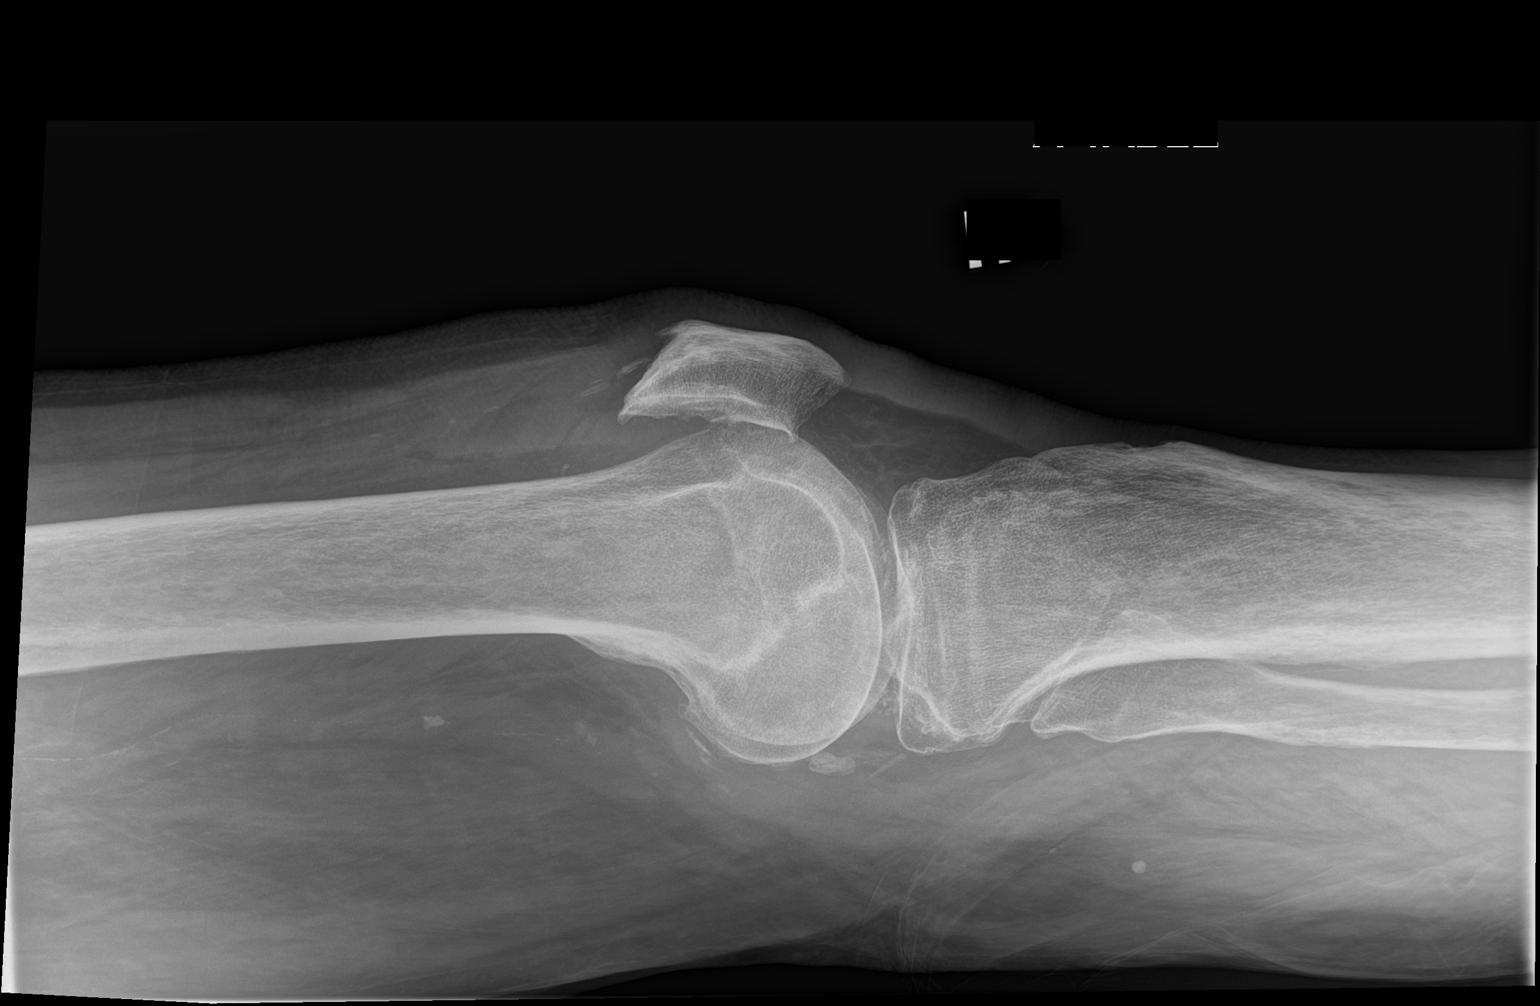

[3 of 3 positions shown; findings below may reference images not displayed]

FINDINGS: No evidence of fracture, dislocation, or joint effusion.
Chondrocalcinosis. Tricompartment degenerative change worse in the
medial tibiofemoral compartment where it is moderate. Vascular
calcifications.
IMPRESSION: 1. No acute osseous abnormality.
2. Tricompartment degenerative change with chondrocalcinosis as can
be seen with CPPD arthropathy.

## 2023-02-02 ENCOUNTER — Ambulatory Visit (INDEPENDENT_AMBULATORY_CARE_PROVIDER_SITE_OTHER)
Admission: RE | Admit: 2023-02-02 | Discharge: 2023-02-02 | Disposition: A | Discharge status: Home / Self Care | Payer: Medicare HMO | Source: Ambulatory Visit | Attending: Physician Assistant | Admitting: Physician Assistant

## 2023-02-02 ENCOUNTER — Ambulatory Visit (HOSPITAL_COMMUNITY)
Admission: RE | Admit: 2023-02-02 | Discharge: 2023-02-02 | Disposition: A | Discharge status: Home / Self Care | Payer: Medicare HMO | Source: Ambulatory Visit | Attending: Physician Assistant | Admitting: Physician Assistant

## 2023-02-02 ENCOUNTER — Ambulatory Visit (INDEPENDENT_AMBULATORY_CARE_PROVIDER_SITE_OTHER): Payer: Medicare HMO | Admitting: Physician Assistant

## 2023-02-02 VITALS — BP 157/101 | HR 110 | Temp 97.9°F | Resp 20 | Ht 68.0 in | Wt 212.0 lb

## 2023-02-02 DIAGNOSIS — I739 Peripheral vascular disease, unspecified: Secondary | ICD-10-CM

## 2023-02-02 LAB — VAS US ABI WITH/WO TBI
Left ABI: 1.02
Right ABI: 0.64

## 2023-02-02 NOTE — Progress Notes (Signed)
Office Note     CC:  follow up Requesting Provider:  Ngetich, Nelda Bucks, NP  HPI: [name] is a 72 y.o. ([birth year]) male who presents for routine follow up of PAD.He most recently had CLI of the right lower extremity in 2023 with tissue loss. He underwent Angiogram on 06/11/22 by Dr. Carlis Abbott with PT angioplasty. His wound post intervention was improving. At his last visit in January he was still going to the wound clinic for management of his wound. 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 He has history of left peroneal angioplasty in July of 2022 for this. At his visit in January of 2024 with Dr. Carlis Abbott the 5th toe had evidence of osteomyelitis according to the patient/ wound care center. Due to progression of this Dr. Carlis Abbott had recommended redirection of attention to the LLE and repeat Angiography. On 12/10/22 he underwent CO2 Aortogram, LLE arteriogram,with left peroneal angioplasty by Dr. Carlis Abbott.  Today he presents with his daughter and granddaughter. He reports that his wounds are doing very well. He was recently release from the wound care center. He denies any pain in his legs. He still has a lot of tightness in his right calf from his achilles tendon being torn but he is hoping to start getting back to exercise now that the wounds are better. He explains that he was not really given any instruction on how to care for the scab that is still present or his feet. His daughter explains that she will have him follow up with the wound center and his Podiatrist to get some instructions. They were advised to get compression stockings from [employer] to help with his leg swelling.  The pt is not on a statin for cholesterol management.  The pt is not on a daily aspirin.   Other AC:  Plavix, Warfarin The pt is on BB for hypertension.   The pt is diabetic Tobacco hx:  current, some days  Past Medical History:  Diagnosis Date   A-fib ([place])    a. (06/04/14) TEE-DC-CV; succesful; large LA  6.2 cm   ADHD    Ascites    Athlete's foot    Chronic systolic heart failure ([place])    a. 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, unspecified CKD stage, unspecified whether long term insulin use (HCC)    Heart murmur    HTN (hypertension)    OSA (obstructive sleep apnea)    PVD (peripheral vascular disease) (HCC)    s/p great toe amputation    Past Surgical History:  Procedure Laterality Date   ABDOMINAL AORTOGRAM W/LOWER EXTREMITY N/A 05/19/2021   Procedure: ABDOMINAL AORTOGRAM W/LOWER EXTREMITY;  Surgeon: Marty Heck, MD;  Location: [facility];  Service: Cardiovascular;  Laterality: N/A;   ABDOMINAL AORTOGRAM W/LOWER EXTREMITY Right 06/11/2022   Procedure: ABDOMINAL AORTOGRAM W/LOWER EXTREMITY;  Surgeon: Marty Heck, MD;  Location: [facility];  Service: Cardiovascular;  Laterality: Right;   ABDOMINAL AORTOGRAM W/LOWER EXTREMITY Left 12/10/2022   Procedure: ABDOMINAL AORTOGRAM W/LOWER EXTREMITY;  Surgeon: Marty Heck, MD;  Location: [facility];  Service: Cardiovascular;  Laterality: Left;   AMPUTATION Left 05/22/2021   Procedure: LEFT FIFTH TOE AMPUTATION;  Surgeon: Marty Heck, MD;  Location:  [facility];  Service: Vascular;  Laterality: Left;   CARDIOVERSION N/A 06/04/2014   Procedure: CARDIOVERSION;  Surgeon: Larey Dresser, MD;  Location[facility] END[facility]COPY;  Service: Cardiovascular;  Laterality: N/A;   CARDIOVERSION N/A 11/06/2021   Procedure: CARDIOVERSION;  Surgeon: Larey Dresser, MD;  Location: [facility] ENDOSCOPY;  Service: Cardiovascular;  Laterality: N/A;   NOSE SURGERY     Nasal septum surgery   PERIPHERAL VASCULAR BALLOON ANGIOPLASTY  06/11/2022   Procedure: PERIPHERAL VASCULAR BALLOON ANGIOPLASTY;  Surgeon: Marty Heck, MD;   Location: [facility];  Service: Cardiovascular;;   PERIPHERAL VASCULAR BALLOON ANGIOPLASTY Left 12/10/2022   Procedure: PERIPHERAL VASCULAR BALLOON ANGIOPLASTY;  Surgeon: Marty Heck, MD;  Location: [place] [facility];  Service: Cardiovascular;  Laterality: Left;  Peroneal   PERIPHERAL VASCULAR INTERVENTION Left 05/19/2021   Procedure: PERIPHERAL VASCULAR INTERVENTION;  Surgeon: Marty Heck, MD;  Location: [facility];  Service: Cardiovascular;  Laterality: Left;   TEE WITHOUT CARDIOVERSION N/A 06/04/2014   Procedure: TRANSESOPHAGEAL ECHOCARDIOGRAM (TEE);  Surgeon: Larey Dresser, MD;  Location: [place];  Service: Cardiovascular;  Laterality: N/A;   TEE WITHOUT CARDIOVERSION N/A 01/06/2016   Procedure: TRANSESOPHAGEAL ECHOCARDIOGRAM (TEE);  Surgeon: Fay Records, MD;  Location: [place];  Service: Cardiovascular;  Laterality: N/A;   TEE WITHOUT CARDIOVERSION N/A 11/06/2021   Procedure: TRANSESOPHAGEAL ECHOCARDIOGRAM (TEE);  Surgeon: Larey Dresser, MD;  Location: [facility] ENDOSCOPY;  Service: Cardiovascular;  Laterality: N/A;   VASECTOMY      Social History   Socioeconomic History   Marital status: Single    Spouse name: Not on file   Number of children: Not on file   Years of education: Not on file   Highest education level: Not on file  Occupational History   Occupation: retired  Tobacco Use   Smoking status: Some Days    Years: 52    Types: Cigarettes, Cigars    Last attempt to quit: 05/2021    Years since quitting: 1.7    Passive exposure: Never   Smokeless tobacco: Never  Vaping Use   Vaping Use: Never used  Substance and Sexual Activity   Alcohol use: Not Currently   Drug use: Never   Sexual activity: Not on file  Other Topics Concern   Not on file  Social History Narrative   ** Merged History Encounter ** Lives in [place] by himself. Retired from [employer] and [employer] for [employer].       Tobacco use, amount per day now:    Past tobacco use, amount per day:   How many years did you use tobacco:   Alcohol use (drinks per week): N/A   Diet:   Do you drink/eat things with caffeine:   Marital status:  Divorced                                What year were you married? 2003   Do you live in a house, apartment, assisted living, condo, trailer, etc.? House   Is it one or more stories? One   How many persons live in your home?   Do you have pets in your home?( please list) N/A   Highest Level of education completed? Bachelors Degree   Current or past profession: Teacher-Special Ed   Do you exercise?  Yes  Type and how often? Daily squats, and push ups.   Do you have a living will? No   Do you have a DNR form?  No                                 If not, do you want to discuss one?   Do you have signed POA/HPOA forms?  No                      If so, please bring to you appointment      Do you have any difficulty bathing or dressing yourself? Bathing only if pants not shirts/not shorts.   Do you have any difficulty preparing food or eating? No   Do you have any difficulty managing your medications? No   Do you have any difficulty managing your finances? No   Do you have any difficulty affording your medications? No   Social Determinants of Health   Financial Resource Strain: Not on file  Food Insecurity: No Food Insecurity (11/10/2021)   Hunger Vital Sign    Worried About Running Out of Food in the Last Year: Never true    Ran Out of Food in the Last Year: Never true  Transportation Needs: No Transportation Needs (11/10/2021)   PRAPARE - Hydrologist (Medical): No    Lack of Transportation (Non-Medical): No  Physical Activity: Not on file  Stress: Not on file  Social Connections: Not on file  Intimate Partner Violence: Not on file    Family History  Problem Relation Age of Onset   Heart attack Mother        deceased   Diabetes Mother    Heart  attack Sister     Current Outpatient Medications  Medication Sig Dispense Refill   APPLE CIDER VINEGAR PO Take 30 mLs by mouth 4 (four) times a week.     ASHWAGANDHA PO Take by mouth.     blood glucose meter kit and supplies Dispense based on patient and insurance preference. Use up to four times daily as directed. (FOR ICD-10 E10.9, E11.9). 1 each 0   clopidogrel (PLAVIX) 75 MG tablet Take 1 tablet (75 mg total) by mouth daily. 30 tablet 11   diclofenac Sodium (VOLTAREN) 1 % GEL Apply 1 Application topically 4 (four) times daily as needed (pain).     empagliflozin (JARDIANCE) 10 MG TABS tablet TAKE 1 TABLET(10 MG) BY MOUTH DAILY 30 tablet 5   ENTRESTO 49-51 MG TAKE 1 TABLET BY MOUTH TWICE DAILY 180 tablet 2   furosemide (LASIX) 20 MG tablet Take 1 tablet (20 mg total) by mouth 2 (two) times daily. 180 tablet 0   insulin glargine (LANTUS) 100 UNIT/ML injection INJECT 8 UNITS UNDER THE SKIN DAILY 10 mL 11   Insulin Syringe-Needle U-100 (INSULIN SYRINGE 1CC/30GX5/16") 30G X 5/16" 1 ML MISC Use once daily for insulin injections. Dx:E11.22 100 each 1   metoprolol succinate (TOPROL XL) 50 MG 24 hr tablet Take 1 tablet (50 mg total) by mouth daily. 90 tablet 3   Misc Natural Products (JOINT SUPPORT PO) Take 1 Dose by mouth 3 (three) times a week.     spironolactone (ALDACTONE) 25 MG tablet Take 1 tablet (25 mg total) by mouth daily. 45 tablet 3   warfarin (COUMADIN) 6 MG tablet Take 0.5-1 tablets (3-6 mg total) by mouth See admin instructions. Take  6 mg on Mon and Fri Take 3 mg on Sun, Tues, Wed, Thurs, and Sat (Patient taking differently: Take 3-6 mg by mouth See admin instructions. Take 6 mg on Mon  Wed and Fri Take 3 mg on Sun, Tues, Thurs, and Sat) 30 tablet 6   No current facility-administered medications for this visit.    No Known Allergies   REVIEW OF SYSTEMS:  [X]  denotes positive finding, [ ]  denotes negative finding Cardiac  Comments:  Chest pain or chest pressure:    Shortness  of breath upon exertion:    Short of breath when lying flat:    Irregular heart rhythm:        Vascular    Pain in calf, thigh, or hip brought on by ambulation:    Pain in feet at night that wakes you up from your sleep:     Blood clot in your veins:    Leg swelling:  X       Pulmonary    Oxygen at home:    Productive cough:     Wheezing:         Neurologic    Sudden weakness in arms or legs:     Sudden numbness in arms or legs:     Sudden onset of difficulty speaking or slurred speech:    Temporary loss of vision in one eye:     Problems with dizziness:         Gastrointestinal    Blood in stool:     Vomited blood:         Genitourinary    Burning when urinating:     Blood in urine:        Psychiatric    Major depression:         Hematologic    Bleeding problems:    Problems with blood clotting too easily:        Skin    Rashes or ulcers:        Constitutional    Fever or chills:      PHYSICAL EXAMINATION:  Vitals:   02/02/23 0847  BP: (!) 157/101  Pulse: (!) 110  Resp: 20  Temp: 97.9 F (36.6 C)  SpO2: 97%  Weight: 212 lb (96.2 kg)  Height: 5\' 8"  (1.727 m)    General:  WDWN in NAD; vital signs documented above Gait: ambulates with caine HENT: WNL, normocephalic Pulmonary: normal non-labored breathing Cardiac: regular HR Abdomen: soft Vascular Exam/Pulses:  Right Left  Radial 2+ (normal) 2+ (normal)  Femoral 2+ (normal) 2+ (normal)  Popliteal 1+ (weak) 1+ (weak)  DP absent absent  PT absent absent   Extremities: without ischemic changes, without Gangrene , without cellulitis; without open wounds; has dry eschar on right achilles tendon with dry skin flap. Left 5th toe amputation site well healed. Feet both very dry with scaly skin. Chronic venous stasis changes to BLE Musculoskeletal: no muscle wasting or atrophy  Neurologic: A&O X 3;  No focal weakness or paresthesias are detected Psychiatric:  The pt has Normal affect.   Non-Invasive  Vascular Imaging:   +-------+-----------+-----------+------------+------------+  ABI/TBIToday's ABIToday's TBIPrevious ABIPrevious TBI  +-------+-----------+-----------+------------+------------+  Right 0.64       0.25       1.19        0.59          +-------+-----------+-----------+------------+------------+  Left  1.02       0.38       1.09  0.35          +-------+-----------+-----------+------------+------------+   +-----------+--------+-----+--------+----------+--------------+  LEFT      PSV cm/sRatioStenosisWaveform  Comments        +-----------+--------+-----+--------+----------+--------------+  CFA Distal 84                   triphasic                 +-----------+--------+-----+--------+----------+--------------+  DFA       61                   triphasic                 +-----------+--------+-----+--------+----------+--------------+  SFA Prox   80                   triphasic                 +-----------+--------+-----+--------+----------+--------------+  SFA Mid    45                   triphasic                 +-----------+--------+-----+--------+----------+--------------+  SFA Distal 45                   triphasic                 +-----------+--------+-----+--------+----------+--------------+  POP Prox   46                   triphasic                 +-----------+--------+-----+--------+----------+--------------+  POP Distal 149                  biphasic                  +-----------+--------+-----+--------+----------+--------------+  TP Trunk   85                                             +-----------+--------+-----+--------+----------+--------------+  ATA Distal 27                   monophasic                +-----------+--------+-----+--------+----------+--------------+  PTA Distal 18                                              +-----------+--------+-----+--------+----------+--------------+  PERO Prox                                 not visualized  +-----------+--------+-----+--------+----------+--------------+  PERO Mid   90                   monophasic                +-----------+--------+-----+--------+----------+--------------+  PERO Distal107                  monophasic                +-----------+--------+-----+--------+----------+--------------+     Summary:  Left: Proximal peroneal artery not visualized. Mid and distal peroneal appear patent.  ASSESSMENT/PLAN:: 72 y.o. male here for follow up for PAD. He has had recent Angiography on BLE most recently the LLE with angioplasty of his peroneal artery on 12/10/22  by Dr. Carlis Abbott due to osteomyelitis of his left 5th toe. His RLE also has chronic wound overlying his achilles tendon. He has now been cleared by the wound care center for both feet. He is without any rest pain or claudication symptoms. He has a lot of venous stasis changes of BLE as well. I have recommended him to be cautious with using compression on his RLE. His left post intervention is okay to use compression. I have recommended elevation of BLE instead. I have also encouraged him to increase his exercise if he is able. His ABIs today are essentially unchanged on the LLE but have decreased quite a bit on the right. The duplex however on the left looks good and suggests patent left peroneal artery post recent angioplasty.  - Recommend he follow up with wound care center/ podiatrist for recommendations on skin care/ eschar care - discussed importance of keeping his feet protected - Continue Plavix - recommend closer interval follow up due to his ABIs dropping on the RLE. I will have him return in 6 -8 weeks with repeat ABI - Pt and daughter know to call earlier if any concerns about his wounds worsening or new wounds   Karoline Caldwell, PA-C [facility] [phone]  On call MD:   Trula Slade

## 2023-02-03 IMAGING — CR DG HIP (WITH OR WITHOUT PELVIS) 2-3V*R*
4 series · 4 of 4 positions shown · non-contrast
Comparison: None.

CLINICAL DATA: Chronic right hip pain

EXAM:
DG HIP (WITH OR WITHOUT PELVIS) 2-3V RIGHT

[pelvis ap]
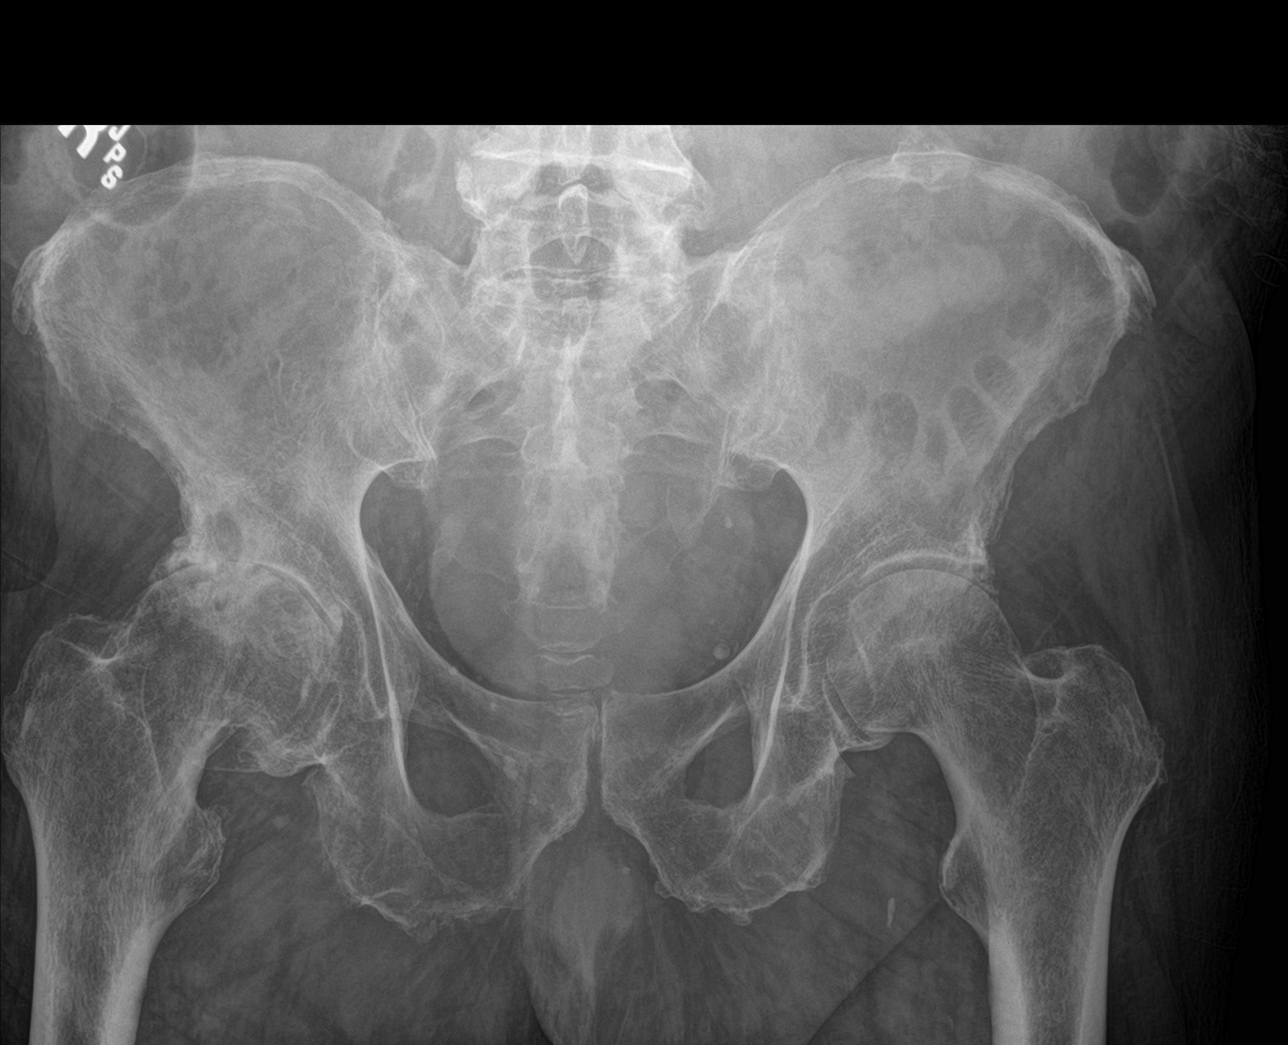

[hip ap]
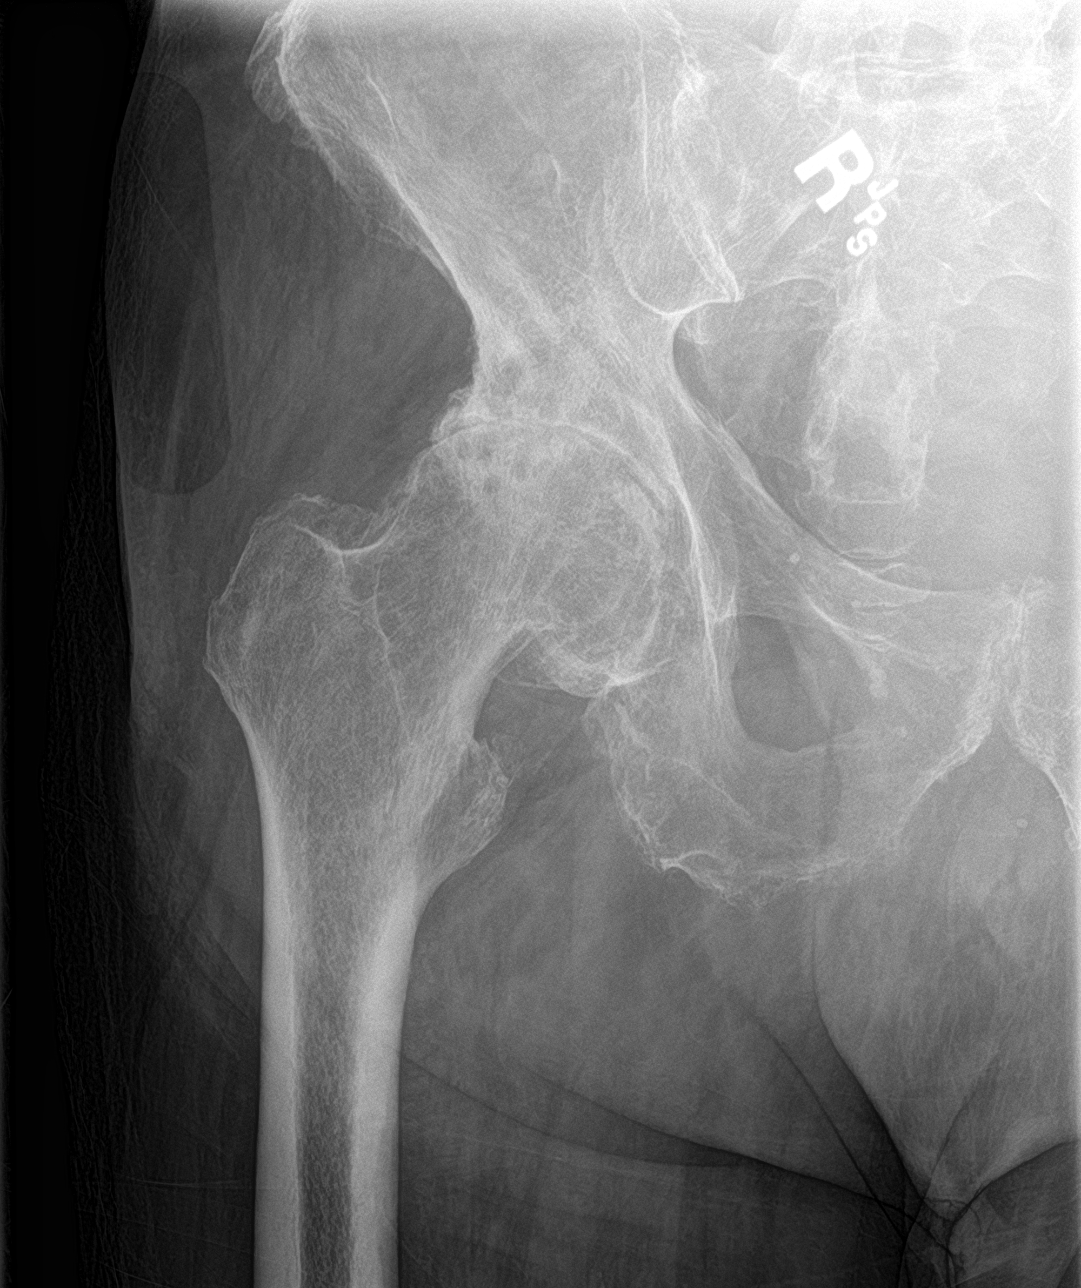

[hip lat (1 of 2)]
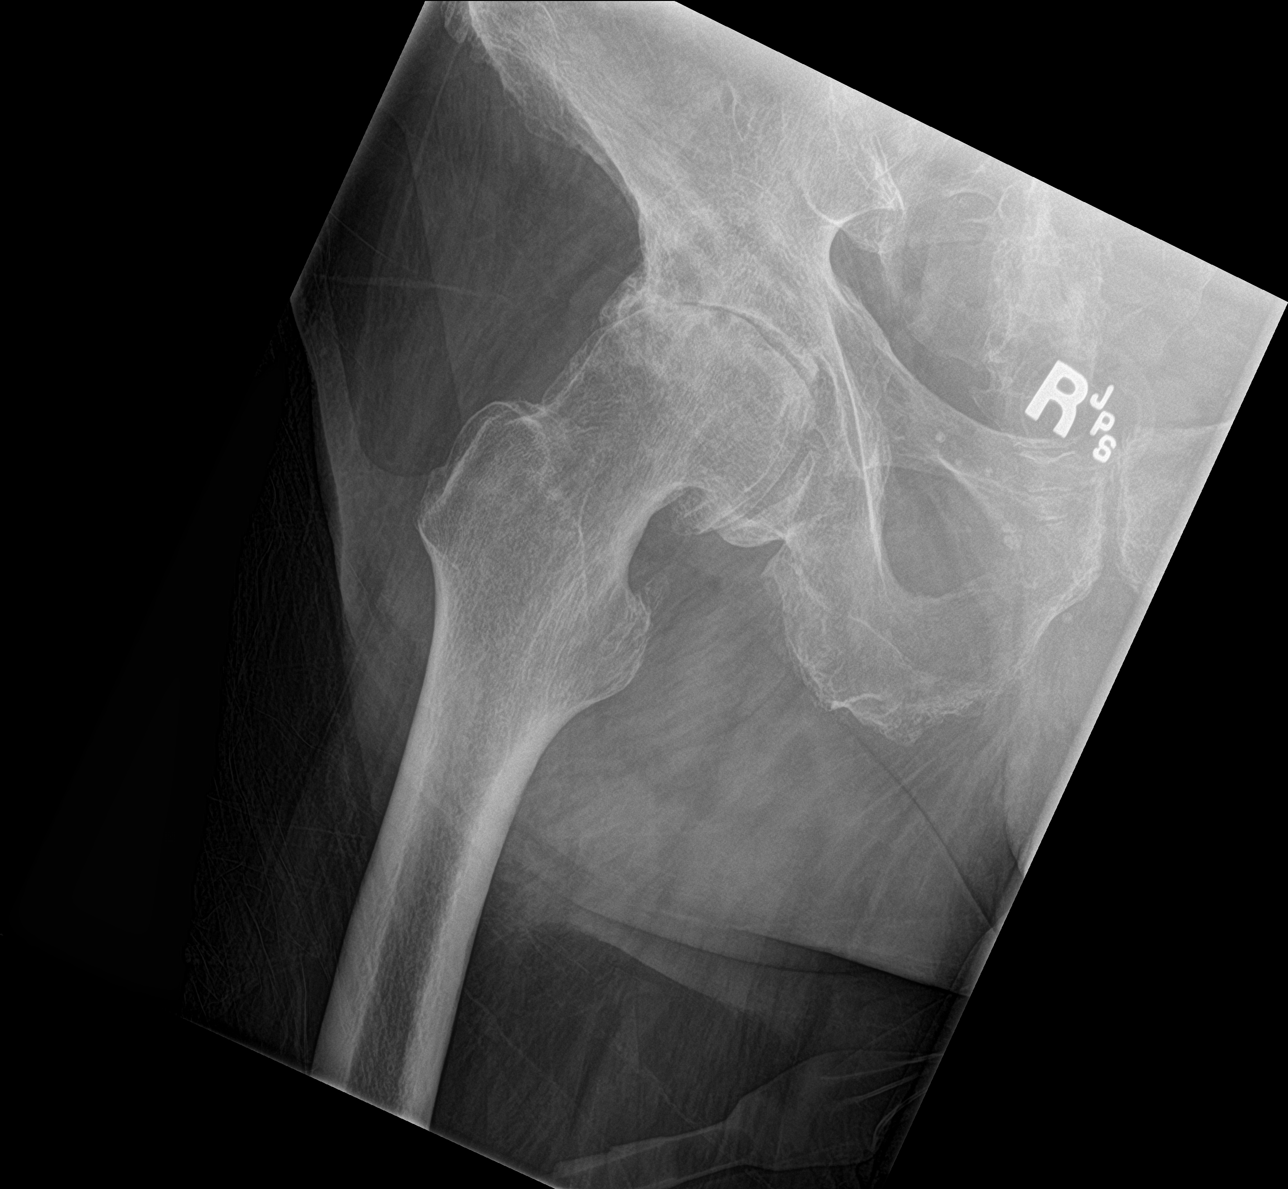

[hip lat (2 of 2)]
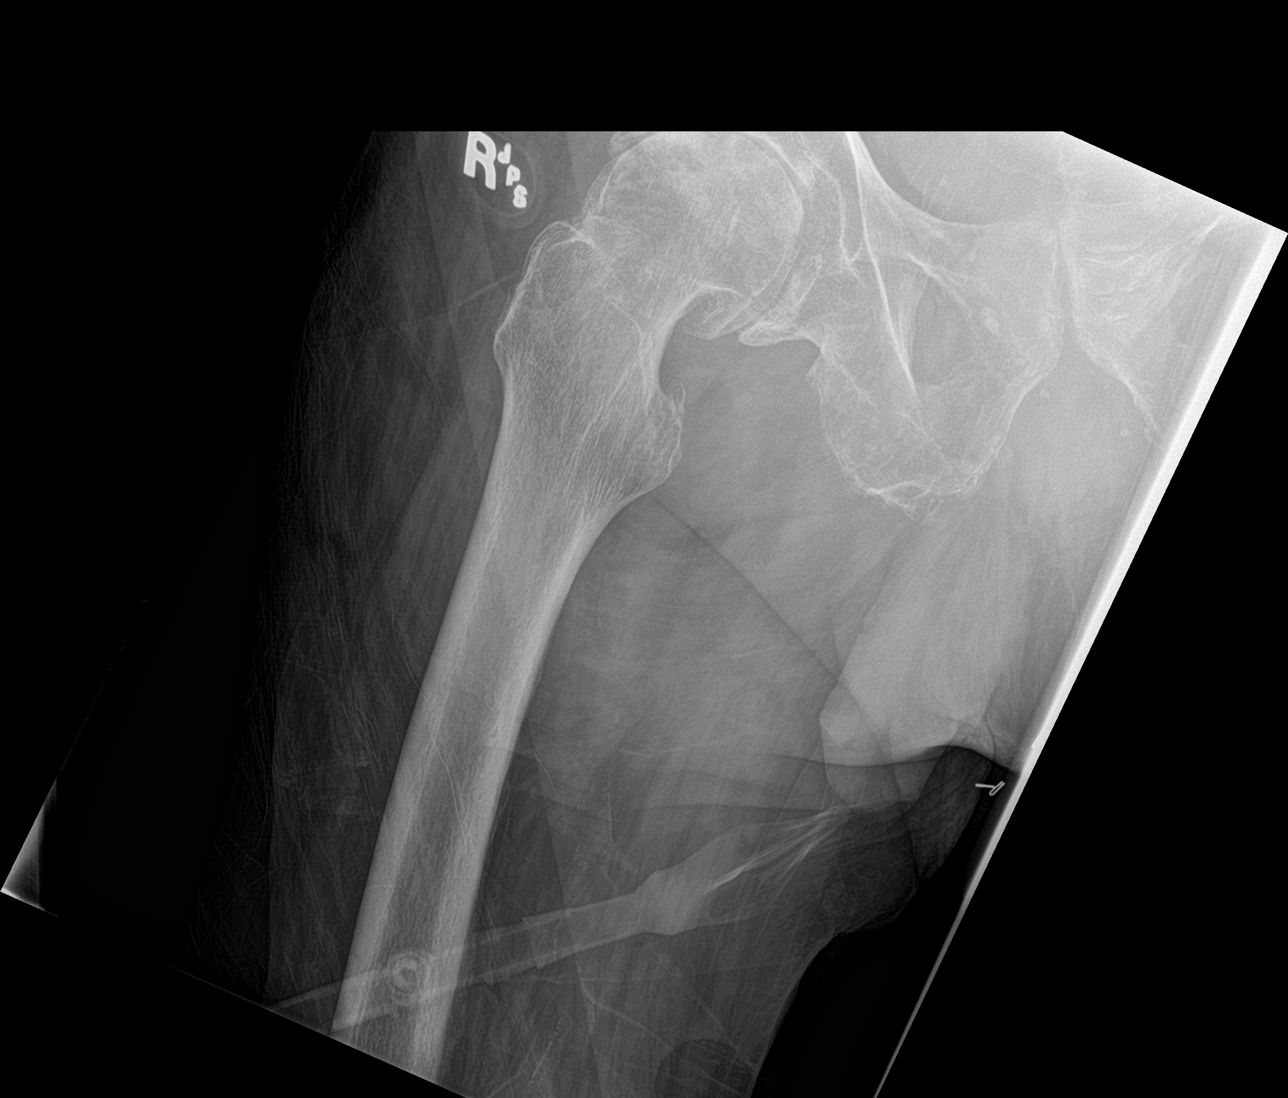

[4 of 4 positions shown; findings below may reference images not displayed]

FINDINGS: Advanced degenerative change right hip with joint space narrowing,
bony sclerosis, and cystic change in the femoral head and adjacent
acetabulum. Probable AVN of the femoral head. Negative for fracture

Negative left hip.  No pelvic bone lesion identified.
IMPRESSION: Advanced degenerative change right hip with probable underlying AVN
right femoral head.

## 2023-02-05 NOTE — Progress Notes (Addendum)
Patient ID: [name], male   DOB: 05/14/1951, 72 y.o.   MRN: [id number]    Advanced Heart Failure Clinic Note   PCP: [facility] Primary Cardiologist: Dr. Claiborne Billings HF Cardiologist: Dr. Haroldine Laws  HPI: [name] is a 72 y.o. male with PAF, severe depression, DM2, CKD stage III, obesity, OSA and chronic systolic HF.  Admitted 6/30-05/16/14 with dyspena and LE edema. Found to be in afib with RVR. Diuresed with IV lasix and d/c weight 234 lbs.   Admitted 2/18 -01/10/2016 wit volume overload/Afib RVR  in the setting of medication compliance. Diuresed with IV lasix and transitioned to lasix 80 mg twice a day. Had TEE but  DC-CV was cancelled due to LA thrombus. Discharge weight 189 pounds.   Admitted 03/14/2016 with  A fib RVR and volume overload in the setting of medication noncompliance. Refused cath, Myoview 12/2015. EF 20% with defects noted on rest and stress images within the inferior wall and to lesser extent the septum suggest ischemic etiology. No reversibility. Restarted on HF meds. D/C with volume overload. Psychiatry met with him and he was deemed to have the ability to make decisions. Diuresed with IV lasix and transitioned to lasix 80 mg twice a day. Discharge weight was 216 pounds.   Echo 5/17 EF 15-20%  Seen in the HF clinic in 2021. EF had normalized.    Admitted 10/28/21 with increased shortness of breath and marked volume overload.  Admit weight 231 pounds. His medications were being delivered in bubbles packs and he was taking metoprolol once a day instead of twice a day and his diuretic regimen was lower. Echo repeated EF 45-50%. Placed on IV lasix and diuresed > 19 liters. Transitioned to lasix 20 mg daily. Hospital course complicated by AKI and A fib. Underwent TEE-DC-CV with conversion to NSR. Beta blocker held due to bradycardia. He was continued on amiodarone 200 mg daily. He was back in A fib on discharge.  Discharge weight 203 pounds.   Admitted 12/11/21  after mechanical fall resulting in rib fracture 5-7 and PTX.  AC stopped due to risk of fall. Discharged 12/15/21.   Follow up 2/23, NYHA II-III, volume ok. Warfarin resumed.  In 8/23 had critical limb ischemia of RLE with non-healing ulcer -> underwent right posterior tibial angioplasty by Dr. 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 Was seen in Gen cardiology clinic and offered DCCV but he refused.   Follow up 3/24, NYHA II-early III, volume overloaded. Medication compliance questionable. Arlyce Harman increased to 25, Lasix increased to 40/20 x 7 days  Today he returns for HF follow up with his daughter. Overall feeling fine. He is not SOB walking on flat ground or with ADLs, LE swelling improved with med changes. Denies palpitations, abnormal bleeding, CP, dizziness, edema, or PND/Orthopnea. Appetite ok. No fever or chills. Weight at home 210 pounds. Taking all medications. Not interested in adding any medications. Takes colloidal silver, tumeric and ashwaghandha.    Cardiac Testing  - TEE (12/22): EF 35% No PFO . No thrombus.  - DC-CV (12/22)--> NSR  - Echo (12/22): EF 45-50% - Echo (2021): EF 55-60 - Echo (5/17): EF 15-20%  - Myoview 2/17 Perfusion: Next defects noted on rest and stress images within the inferior wall and to lesser extent the septum. No reversible defects. Wall Motion: Severe generalized hypokinesia. Left ventricle is dilute Left Ventricular Ejection Fraction: 20 % End diastolic volume 99991111 ml End systolic volume [id number] ml  IMPRESSION: 1. 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 No evidence of ischemia. 2. Severe generalized hypokinesia with dilated left ventricle. 3. Left ventricular ejection fraction 20% 4. High-risk stress test findings*.  ROS: All systems negative except as listed in HPI, PMH and Problem List.  SH:  Social History   Socioeconomic History   Marital status: Single    Spouse name: Not on file   Number of  children: Not on file   Years of education: Not on file   Highest education level: Not on file  Occupational History   Occupation: retired  Tobacco Use   Smoking status: Some Days    Years: 52    Types: Cigarettes, Cigars    Last attempt to quit: 05/2021    Years since quitting: 1.7    Passive exposure: Never   Smokeless tobacco: Never  Vaping Use   Vaping Use: Never used  Substance and Sexual Activity   Alcohol use: Not Currently   Drug use: Never   Sexual activity: Not on file  Other Topics Concern   Not on file  Social History Narrative   ** Merged History Encounter ** Lives in [place] by himself. Retired from [employer] and [employer] for [employer].       Tobacco use, amount per day now:   Past tobacco use, amount per day:   How many years did you use tobacco:   Alcohol use (drinks per week): N/A   Diet:   Do you drink/eat things with caffeine:   Marital status:  Divorced                                What year were you married? 2003   Do you live in a house, apartment, assisted living, condo, trailer, etc.? House   Is it one or more stories? One   How many persons live in your home?   Do you have pets in your home?( please list) N/A   Highest Level of education completed? Bachelors Degree   Current or past profession: Teacher-Special Ed   Do you exercise?  Yes                                Type and how often? Daily squats, and push ups.   Do you have a living will? No   Do you have a DNR form?  No                                 If not, do you want to discuss one?   Do you have signed POA/HPOA forms?  No                      If so, please bring to you appointment      Do you have any difficulty bathing or dressing yourself? Bathing only if pants not shirts/not shorts.   Do you have any difficulty preparing food or eating? No   Do you have any difficulty managing your medications? No   Do you have any difficulty managing your finances? No   Do you have any  difficulty affording your medications? No   Social Determinants of Radio broadcast assistant Strain: Not on file  Food Insecurity: No Food  Insecurity (11/10/2021)   Hunger Vital Sign    Worried About Running Out of Food in the Last Year: Never true    Ran Out of Food in the Last Year: Never true  Transportation Needs: No Transportation Needs (11/10/2021)   PRAPARE - Hydrologist (Medical): No    Lack of Transportation (Non-Medical): No  Physical Activity: Not on file  Stress: Not on file  Social Connections: Not on file  Intimate Partner Violence: Not on file   FH:  Family History  Problem Relation Age of Onset   Heart attack Mother        deceased   Diabetes Mother    Heart attack Sister    Past Medical History:  Diagnosis Date   A-fib    a. (06/04/14) TEE-DC-CV; succesful; large LA 6.2 cm   ADHD    Ascites    Athlete's foot    Chronic systolic heart failure    a. 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, unspecified CKD stage, unspecified whether long term insulin use    Heart murmur    HTN (hypertension)    OSA (obstructive sleep apnea)    PVD (peripheral vascular disease)    s/p great toe amputation   Current Outpatient Medications  Medication Sig Dispense Refill   APPLE CIDER VINEGAR PO Take 30 mLs by mouth 4 (four) times a week.     blood glucose meter kit and supplies Dispense based on patient and insurance preference. Use up to four times daily as directed. (FOR ICD-10 E10.9, E11.9). 1 each 0   clopidogrel (PLAVIX) 75 MG tablet Take 1 tablet (75 mg total) by mouth daily. 30 tablet 11   diclofenac Sodium (VOLTAREN) 1 % GEL Apply 1 Application topically 4 (four) times daily as needed (pain).     empagliflozin (JARDIANCE) 10 MG TABS tablet  TAKE 1 TABLET(10 MG) BY MOUTH DAILY 30 tablet 5   ENTRESTO 49-51 MG TAKE 1 TABLET BY MOUTH TWICE DAILY 180 tablet 2   furosemide (LASIX) 20 MG tablet Patient takes 1.5 tablet in the morning and 1.5 tablet at night.     insulin glargine (LANTUS) 100 UNIT/ML injection INJECT 8 UNITS UNDER THE SKIN DAILY 10 mL 11   Insulin Syringe-Needle U-100 (INSULIN SYRINGE 1CC/30GX5/16") 30G X 5/16" 1 ML MISC Use once daily for insulin injections. Dx:E11.22 100 each 1   metoprolol succinate (TOPROL XL) 50 MG 24 hr tablet Take 1 tablet (50 mg total) by mouth daily. 90 tablet 3   Misc Natural Products (JOINT SUPPORT PO) Take 1 Dose by mouth 3 (three) times a week.     spironolactone (ALDACTONE) 25 MG tablet Take 1 tablet (25 mg total) by mouth daily. 45 tablet 3   warfarin (COUMADIN) 6 MG tablet Take 0.5-1 tablets (3-6 mg total) by mouth See admin instructions. Take 6 mg on Mon and Fri Take 3 mg on Sun, Tues, Wed, Thurs, and Sat (Patient taking differently: Take 3-6 mg by mouth See admin instructions. Take 6 mg on Mon  Wed and Fri Take 3 mg on Sun, Tues, Thurs, and Sat) 30 tablet 6   ASHWAGANDHA PO Take by mouth. (Patient not taking: Reported on 02/10/2023)     No current facility-administered medications for this encounter.  BP 132/86   Pulse (!) 122   Wt 98.4 kg (217 lb)   SpO2 97%   BMI 32.99 kg/m   Wt Readings from Last 3 Encounters:  02/10/23 98.4 kg (217 lb)  02/02/23 96.2 kg (212 lb)  01/13/23 96.5 kg (212 lb 12.8 oz)   PHYSICAL EXAM: General:  NAD. No resp difficulty, walked into clinic HEENT: Normal Neck: Supple. No JVD. Carotids 2+ bilat; no bruits. No lymphadenopathy or thryomegaly appreciated. Cor: PMI nondisplaced. Tachy irregular rate & rhythm. No rubs, gallops or murmurs. Lungs: Clear Abdomen: Soft, nontender, nondistended. No hepatosplenomegaly. No bruits or masses. Good bowel sounds. Extremities: No cyanosis, clubbing, rash, edema; RLE wrapped. Neuro: Alert & oriented x 3, cranial  nerves grossly intact. Moves all 4 extremities w/o difficulty. Affect pleasant.  REDs: 23%  ECG (personally reviewed: AFL vs accelerated junctional rhythm, HR 122 bpm  ASSESSMENT & PLAN: 1. Chronic HFmEF - Admitted 12/22 with marked volume overload. Not taking medications as prescribed. - Myoview 12/2015. EF 20% with defects noted on rest and stress images within the inferior wall and to lesser extent the septum suggest ischemic etiology. No reversibility. Refused cath multiple times. No s/s angina currently.  - Echo (10/21): EF 55-60%. - Echo (12/22): EF 45-50%, RV moderately reduced, RVSP 85 mmHg - NYHA II, volume ok, REDs 23% - Increase Toprol XL to 50 mg bid (see #2 below). - Continue spiro 25 mg daily  - Continue Lasix 30 mg bid. - Continue Entresto 49/51 mg bid.  - Continue Jardiance 10 mg daily. - Labs today. - Update echo  2. PAF/ AFL - s/p DCCV 11/06/21>>Sinus brady but was back in A fib prior to discharge.  - It appears he has been in AFL since at least 1/24. Was seen in Gen cardiology clinic and offered DCCV but he refused. - Pursuing rate control. - ECG shows AFL with RVR today, he is asymptomatic - We discussed repeating DCCV, with addition of amiodarone support. He declines. - We discussed adding digoxin for rate control. He declines. - Increase beta blocker as above. - Continue warfarin. Followed by  Coumadin Clinic.   3. CKD Stage IIIa-IIIb - Baseline SCr 1.5-1.7  - Continue Jardiance   4. DMII - Hgb A1C 6.8.  - on insulin per PCP  - Continue Jardiance   5. HTN - Controlled on current regimen - GDMT per above   6.  H/o LV Thrombus - Resolved - On warfarin for AF/AFL  7. PVD/ Chronic LE Wounds  - Followed by vascular and podiatry for right heel wound - ABIs (6/23) R moderate, L mild - Wounds followed by wound clinic   Follow up in 2 months with Dr. Gala Romney. Will update echo. I asked him to hold his ashwaghandha until HR better  controlled.  Anderson Malta Nannie Starzyk FNP-BC  9:54 AM

## 2023-02-08 ENCOUNTER — Ambulatory Visit: Payer: Medicare HMO | Attending: Internal Medicine | Admitting: *Deleted

## 2023-02-08 DIAGNOSIS — I513 Intracardiac thrombosis, not elsewhere classified: Secondary | ICD-10-CM

## 2023-02-08 DIAGNOSIS — I4891 Unspecified atrial fibrillation: Secondary | ICD-10-CM | POA: Diagnosis not present

## 2023-02-08 DIAGNOSIS — I483 Typical atrial flutter: Secondary | ICD-10-CM | POA: Diagnosis not present

## 2023-02-08 DIAGNOSIS — Z5181 Encounter for therapeutic drug level monitoring: Secondary | ICD-10-CM

## 2023-02-08 LAB — POCT INR: INR: 2.7 (ref 2.0–3.0)

## 2023-02-08 NOTE — Patient Instructions (Signed)
Description   Continue 1/2 tablet daily except for 1 tablet on Mondays, Wednesdays, and Fridays.  Recheck INR 3 weeks.  Coumadin Clinic [phone].

## 2023-02-09 ENCOUNTER — Other Ambulatory Visit: Payer: Self-pay

## 2023-02-09 ENCOUNTER — Telehealth (HOSPITAL_COMMUNITY): Payer: Self-pay

## 2023-02-09 DIAGNOSIS — I739 Peripheral vascular disease, unspecified: Secondary | ICD-10-CM

## 2023-02-09 DIAGNOSIS — I70223 Atherosclerosis of native arteries of extremities with rest pain, bilateral legs: Secondary | ICD-10-CM

## 2023-02-09 NOTE — Telephone Encounter (Signed)
Called and spoke to patient's daughter to confirm/remind patient of their appointment at the [facility] on 02/10/23.   Patient reminded to bring all medications and/or complete list.  Confirmed patient has transportation. Gave directions, instructed to utilize [place] parking.  Confirmed appointment prior to ending call.

## 2023-02-10 ENCOUNTER — Encounter (HOSPITAL_COMMUNITY): Payer: Self-pay

## 2023-02-10 ENCOUNTER — Ambulatory Visit (HOSPITAL_COMMUNITY)
Admission: RE | Admit: 2023-02-10 | Discharge: 2023-02-10 | Disposition: A | Discharge status: Home / Self Care | Payer: Medicare HMO | Source: Ambulatory Visit | Attending: Family Medicine | Admitting: Family Medicine

## 2023-02-10 VITALS — BP 132/86 | HR 122 | Wt 217.0 lb

## 2023-02-10 DIAGNOSIS — E1122 Type 2 diabetes mellitus with diabetic chronic kidney disease: Secondary | ICD-10-CM | POA: Insufficient documentation

## 2023-02-10 DIAGNOSIS — Z6832 Body mass index (BMI) 32.0-32.9, adult: Secondary | ICD-10-CM | POA: Diagnosis not present

## 2023-02-10 DIAGNOSIS — N183 Chronic kidney disease, stage 3 unspecified: Secondary | ICD-10-CM

## 2023-02-10 DIAGNOSIS — Z86718 Personal history of other venous thrombosis and embolism: Secondary | ICD-10-CM | POA: Insufficient documentation

## 2023-02-10 DIAGNOSIS — Z794 Long term (current) use of insulin: Secondary | ICD-10-CM

## 2023-02-10 DIAGNOSIS — Z79899 Other long term (current) drug therapy: Secondary | ICD-10-CM | POA: Diagnosis not present

## 2023-02-10 DIAGNOSIS — F32A Depression, unspecified: Secondary | ICD-10-CM | POA: Insufficient documentation

## 2023-02-10 DIAGNOSIS — Z7901 Long term (current) use of anticoagulants: Secondary | ICD-10-CM | POA: Insufficient documentation

## 2023-02-10 DIAGNOSIS — I10 Essential (primary) hypertension: Secondary | ICD-10-CM | POA: Diagnosis not present

## 2023-02-10 DIAGNOSIS — N1831 Chronic kidney disease, stage 3a: Secondary | ICD-10-CM | POA: Diagnosis not present

## 2023-02-10 DIAGNOSIS — I130 Hypertensive heart and chronic kidney disease with heart failure and stage 1 through stage 4 chronic kidney disease, or unspecified chronic kidney disease: Secondary | ICD-10-CM | POA: Diagnosis not present

## 2023-02-10 DIAGNOSIS — E669 Obesity, unspecified: Secondary | ICD-10-CM | POA: Diagnosis not present

## 2023-02-10 DIAGNOSIS — I5022 Chronic systolic (congestive) heart failure: Secondary | ICD-10-CM | POA: Diagnosis not present

## 2023-02-10 DIAGNOSIS — Z7984 Long term (current) use of oral hypoglycemic drugs: Secondary | ICD-10-CM | POA: Insufficient documentation

## 2023-02-10 DIAGNOSIS — I513 Intracardiac thrombosis, not elsewhere classified: Secondary | ICD-10-CM | POA: Diagnosis not present

## 2023-02-10 DIAGNOSIS — E1151 Type 2 diabetes mellitus with diabetic peripheral angiopathy without gangrene: Secondary | ICD-10-CM | POA: Diagnosis not present

## 2023-02-10 DIAGNOSIS — I739 Peripheral vascular disease, unspecified: Secondary | ICD-10-CM

## 2023-02-10 DIAGNOSIS — Z91148 Patient's other noncompliance with medication regimen for other reason: Secondary | ICD-10-CM | POA: Insufficient documentation

## 2023-02-10 DIAGNOSIS — I4892 Unspecified atrial flutter: Secondary | ICD-10-CM

## 2023-02-10 DIAGNOSIS — G4733 Obstructive sleep apnea (adult) (pediatric): Secondary | ICD-10-CM | POA: Diagnosis not present

## 2023-02-10 DIAGNOSIS — I480 Paroxysmal atrial fibrillation: Secondary | ICD-10-CM | POA: Insufficient documentation

## 2023-02-10 LAB — BASIC METABOLIC PANEL
Anion gap: 10 (ref 5–15)
BUN: 42 mg/dL — ABNORMAL HIGH (ref 8–23)
CO2: 25 mmol/L (ref 22–32)
Calcium: 9.3 mg/dL (ref 8.9–10.3)
Chloride: 102 mmol/L (ref 98–111)
Creatinine, Ser: 1.87 mg/dL — ABNORMAL HIGH (ref 0.61–1.24)
GFR, Estimated: 38 mL/min — ABNORMAL LOW (ref >60)
Glucose, Bld: 141 mg/dL — ABNORMAL HIGH (ref 70–99)
Potassium: 4.6 mmol/L (ref 3.5–5.1)
Sodium: 137 mmol/L (ref 135–145)

## 2023-02-10 LAB — BRAIN NATRIURETIC PEPTIDE: B Natriuretic Peptide: 203.4 pg/mL — ABNORMAL HIGH (ref 0.0–100.0)

## 2023-02-10 MED ORDER — METOPROLOL SUCCINATE ER 50 MG PO TB24: 50.0000 mg | ORAL_TABLET | Freq: Two times a day (BID) | ORAL | 3 refills | Status: DC

## 2023-02-10 NOTE — Progress Notes (Signed)
ReDS Vest / Clip - 02/10/23 1000       ReDS Vest / Clip   Station Marker D    Ruler Value 35    ReDS Value Range Low volume    ReDS Actual Value 23

## 2023-02-10 NOTE — Patient Instructions (Signed)
INCREASE Toprol XL to 50mg  twice a day  Labs today We will only contact you if something comes back abnormal or we need to make some changes. Otherwise no news is good news!  Your physician has requested that you have an echocardiogram. Echocardiography is a painless test that uses sound waves to create images of your heart. It provides your doctor with information about the size and shape of your heart and how well your heart's chambers and valves are working. This procedure takes approximately one hour. There are no restrictions for this procedure. Please do NOT wear cologne, perfume, aftershave, or lotions (deodorant is allowed). Please arrive 15 minutes prior to your appointment time.  Your physician recommends that you schedule a follow-up appointment in: 6 weeks with Dr Haroldine Laws  Do the following things EVERYDAY: Weigh yourself in the morning before breakfast. Write it down and keep it in a log. Take your medicines as prescribed Eat low salt foods--Limit salt (sodium) to 2000 mg per day.  Stay as active as you can everyday Limit all fluids for the day to less than 2 liters  At the [facility], you and your health needs are our priority. As part of our continuing mission to provide you with exceptional heart care, we have created designated Provider Care Teams. These Care Teams include your primary Cardiologist (physician) and Advanced Practice Providers (APPs- Physician Assistants and Nurse Practitioners) who all work together to provide you with the care you need, when you need it.   You may see any of the following providers on your designated Care Team at your next follow up: Dr Glori Bickers Dr Loralie Champagne Dr. Roxana Hires, NP Lyda Jester, [place] [facility], [place] Forestine Na, NP Audry Riles, PharmD   Please be sure to bring in all your medications bottles to every appointment.    Thank you for choosing [facility]   If you have any questions or concerns before your next appointment please send [place] a message through [place] or call our office at [phone].    TO LEAVE A MESSAGE FOR THE NURSE SELECT OPTION 2, PLEASE LEAVE A MESSAGE INCLUDING: YOUR NAME DATE OF BIRTH CALL BACK NUMBER REASON FOR CALL**this is important as we prioritize the call backs  YOU WILL RECEIVE A CALL BACK THE SAME DAY AS LONG AS YOU CALL BEFORE 4:00 PM

## 2023-02-12 ENCOUNTER — Telehealth: Payer: Self-pay | Admitting: *Deleted

## 2023-02-12 NOTE — Telephone Encounter (Signed)
No pre cert required for echo per [facility] since pt has not had an echo within the past year.

## 2023-02-12 NOTE — Addendum Note (Signed)
Encounter addended by: Jacklynn Ganong, FNP on: 02/12/2023 2:47 PM  Actions taken: Clinical Note Signed

## 2023-02-17 ENCOUNTER — Encounter: Payer: Self-pay | Admitting: Family

## 2023-02-17 ENCOUNTER — Ambulatory Visit (INDEPENDENT_AMBULATORY_CARE_PROVIDER_SITE_OTHER): Payer: Medicare HMO | Admitting: Family

## 2023-02-17 DIAGNOSIS — Z0000 Encounter for general adult medical examination without abnormal findings: Secondary | ICD-10-CM | POA: Diagnosis not present

## 2023-02-17 NOTE — Progress Notes (Signed)
Subjective:   [name] is a 72 y.o. male who presents for Medicare Annual/Subsequent preventive examination.  Review of Systems     Cardiac Risk Factors include: advanced age (>37men, >67 women);male gender;sedentary lifestyle;diabetes mellitus;hypertension;obesity (BMI >30kg/m2);smoking/ tobacco exposure     Objective:    There were no vitals filed for this visit. There is no height or weight on file to calculate BMI.     02/17/2023   12:48 PM 12/10/2022    6:45 AM 06/18/2022    9:53 AM 06/11/2022    9:35 AM 03/03/2022    8:40 AM 03/02/2022   10:03 AM 12/18/2021   11:49 AM  Advanced Directives  Does Patient Have a Medical Advance Directive? No No No No No No No  Would patient like information on creating a medical advance directive? No - Patient declined No - Patient declined No - Patient declined  No - Patient declined  No - Patient declined    Current Medications (verified) Outpatient Encounter Medications as of 02/17/2023  Medication Sig   APPLE CIDER VINEGAR PO Take 30 mLs by mouth 4 (four) times a week.   clopidogrel (PLAVIX) 75 MG tablet Take 1 tablet (75 mg total) by mouth daily.   diclofenac Sodium (VOLTAREN) 1 % GEL Apply 1 Application topically 4 (four) times daily as needed (pain).   empagliflozin (JARDIANCE) 10 MG TABS tablet TAKE 1 TABLET(10 MG) BY MOUTH DAILY   ENTRESTO 49-51 MG TAKE 1 TABLET BY MOUTH TWICE DAILY   insulin glargine (LANTUS) 100 UNIT/ML injection INJECT 8 UNITS UNDER THE SKIN DAILY   Insulin Syringe-Needle U-100 (INSULIN SYRINGE 1CC/30GX5/16") 30G X 5/16" 1 ML MISC Use once daily for insulin injections. Dx:E11.22   Misc Natural Products (JOINT SUPPORT PO) Take 1 Dose by mouth 3 (three) times a week.   spironolactone (ALDACTONE) 25 MG tablet Take 1 tablet (25 mg total) by mouth daily.   warfarin (COUMADIN) 6 MG tablet Take 0.5-1 tablets (3-6 mg total) by mouth See admin instructions. Take 6 mg on Mon and Fri Take 3 mg on Sun, Tues, Wed, Thurs,  and Sat   [DISCONTINUED] blood glucose meter kit and supplies Dispense based on patient and insurance preference. Use up to four times daily as directed. (FOR ICD-10 E10.9, E11.9).   [DISCONTINUED] furosemide (LASIX) 20 MG tablet Patient takes 1.5 tablet in the morning and 1.5 tablet at night.   [DISCONTINUED] metoprolol succinate (TOPROL XL) 50 MG 24 hr tablet Take 1 tablet (50 mg total) by mouth 2 (two) times daily.   [DISCONTINUED] ASHWAGANDHA PO Take by mouth. (Patient not taking: Reported on 02/10/2023)   No facility-administered encounter medications on file as of 02/17/2023.    Allergies (verified) Patient has no known allergies.   History: Past Medical History:  Diagnosis Date   A-fib    a. (06/04/14) TEE-DC-CV; succesful; large LA 6.2 cm   ADHD    Ascites    Athlete's foot    Chronic systolic heart failure    a. 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, unspecified CKD stage, unspecified whether long term insulin use    Heart murmur    HTN (hypertension)    OSA (obstructive sleep apnea)    PVD (peripheral vascular disease)    s/p great toe amputation  Past Surgical History:  Procedure Laterality Date   ABDOMINAL AORTOGRAM W/LOWER EXTREMITY N/A 05/19/2021   Procedure: ABDOMINAL AORTOGRAM W/LOWER EXTREMITY;  Surgeon: Cephus Shellinglark, Christopher J, MD;  Location: [facility];  Service: Cardiovascular;  Laterality: N/A;   ABDOMINAL AORTOGRAM W/LOWER EXTREMITY Right 06/11/2022   Procedure: ABDOMINAL AORTOGRAM W/LOWER EXTREMITY;  Surgeon: Cephus Shellinglark, Christopher J, MD;  Location: [facility];  Service: Cardiovascular;  Laterality: Right;   ABDOMINAL AORTOGRAM W/LOWER EXTREMITY Left 12/10/2022   Procedure: ABDOMINAL AORTOGRAM W/LOWER EXTREMITY;  Surgeon: Cephus Shellinglark, Christopher J, MD;  Location:  [facility];  Service: Cardiovascular;  Laterality: Left;   AMPUTATION Left 05/22/2021   Procedure: LEFT FIFTH TOE AMPUTATION;  Surgeon: Cephus Shellinglark, Christopher J, MD;  [facility];  Service: Vascular;  Laterality: Left;   CARDIOVERSION N/A 06/04/2014   Procedure: CARDIOVERSION;  Surgeon: Laurey Moralealton S McLean, MD;  [facility];  Service: Cardiovascular;  Laterality: N/A;   CARDIOVERSION N/A 11/06/2021   Procedure: CARDIOVERSION;  Surgeon: Laurey MoraleMcLean, Dalton S, MD;  [facility];  Service: Cardiovascular;  Laterality: N/A;   NOSE SURGERY     Nasal septum surgery   PERIPHERAL VASCULAR BALLOON ANGIOPLASTY  06/11/2022   Procedure: PERIPHERAL VASCULAR BALLOON ANGIOPLASTY;  Surgeon: Cephus Shellinglark, Christopher J, MD;  [facility];  Service: Cardiovascular;;   PERIPHERAL VASCULAR BALLOON ANGIOPLASTY Left 12/10/2022   Procedure: PERIPHERAL VASCULAR BALLOON ANGIOPLASTY;  Surgeon: Cephus Shellinglark, Christopher J, MD;  [facility];  Service: Cardiovascular;  Laterality: Left;  Peroneal   PERIPHERAL VASCULAR INTERVENTION Left 05/19/2021   Procedure: PERIPHERAL VASCULAR INTERVENTION;  Surgeon: Cephus Shellinglark, Christopher J, MD;  [facility];  Service: Cardiovascular;  Laterality: Left;   TEE WITHOUT CARDIOVERSION N/A 06/04/2014   Procedure: TRANSESOPHAGEAL ECHOCARDIOGRAM (TEE);  Surgeon: Laurey Moralealton S McLean, MD;  [facility];  Service: Cardiovascular;  Laterality: N/A;   TEE WITHOUT CARDIOVERSION N/A 01/06/2016   Procedure: TRANSESOPHAGEAL ECHOCARDIOGRAM (TEE);  Surgeon: Pricilla RifflePaula Ross V, MD;  [facility];  Service: Cardiovascular;  Laterality: N/A;   TEE WITHOUT CARDIOVERSION N/A 11/06/2021   Procedure: TRANSESOPHAGEAL ECHOCARDIOGRAM (TEE);  Surgeon: Laurey MoraleMcLean, Dalton S, MD;  [facility];  Service: Cardiovascular;  Laterality: N/A;   VASECTOMY     Family History  Problem Relation Age of Onset   Heart attack Mother        deceased   Diabetes Mother     Heart attack Sister    Social History   Socioeconomic History   Marital status: Single    Spouse name: Not on file   Number of children: Not on file   Years of education: Not on file   Highest education level: Not on file  Occupational History   Occupation: retired  Tobacco Use   Smoking status: Some Days    Years: 52    Types: Cigarettes, Cigars    Last attempt to quit: 05/2021    Years since quitting: 1.7    Passive exposure: Never   Smokeless tobacco: Never  Vaping Use   Vaping Use: Never used  Substance and Sexual Activity   Alcohol use: Not Currently   Drug use: Never   Sexual activity: Not on file  Other Topics Concern   Not on file  Social History Narrative   ** Merged History Encounter ** [place] in [place] by himself. Retired from [employer] for [employer].       Tobacco use, amount per day now:   Past tobacco use,  amount per day:   How many years did you use tobacco:   Alcohol use (drinks per week): N/A   Diet:   Do you drink/eat things with caffeine:   Marital status:  Divorced                                What year were you married? 2003   Do you live in a house, apartment, assisted living, condo, trailer, etc.? House   Is it one or more stories? One   How many persons live in your home?   Do you have pets in your home?( please list) N/A   Highest Level of education completed? Bachelors Degree   Current or past profession: Teacher-Special Ed   Do you exercise?  Yes                                Type and how often? Daily squats, and push ups.   Do you have a living will? No   Do you have a DNR form?  No                                 If not, do you want to discuss one?   Do you have signed POA/HPOA forms?  No                      If so, please bring to you appointment      Do you have any difficulty bathing or dressing yourself? Bathing only if pants not shirts/not shorts.   Do you have any difficulty preparing food or eating? No    Do you have any difficulty managing your medications? No   Do you have any difficulty managing your finances? No   Do you have any difficulty affording your medications? No   Social Determinants of Health   Financial Resource Strain: Not on file  Food Insecurity: No Food Insecurity (11/10/2021)   Hunger Vital Sign    Worried About Running Out of Food in the Last Year: Never true    Ran Out of Food in the Last Year: Never true  Transportation Needs: No Transportation Needs (11/10/2021)   PRAPARE - Administrator, Civil Service (Medical): No    Lack of Transportation (Non-Medical): No  Physical Activity: Not on file  Stress: Not on file  Social Connections: Not on file    Tobacco Counseling Ready to quit: Not Answered Counseling given: Not Answered   Clinical Intake:  Pre-visit preparation completed: No  Pain : No/denies pain  BMI - recorded: 32.99 Nutritional Status: BMI > 30  Obese Nutritional Risks: Non-healing wound Diabetes: Yes CBG done?: No Did pt. bring in CBG monitor from home?: No  Diabetic?Yes   Activities of Daily Living    02/17/2023    1:15 PM 02/17/2023   12:48 PM  In your present state of health, do you have any difficulty performing the following activities:  Hearing? 0 1  Vision? 1 1  Difficulty concentrating or making decisions? 0 0  Walking or climbing stairs? 1 0  Comment on wheelchair   Dressing or bathing? 0 0  Doing errands, shopping? 1 0  Comment daughter Advice worker and eating ? Y   Comment daughter assist  Using the Toilet? N   In the past six months, have you accidently leaked urine? N   Do you have problems with loss of bowel control? N   Managing your Medications? N   Managing your Finances? N   Housekeeping or managing your Housekeeping? Y   Comment daughter     Patient Care Team: Jamarques Pinedo, Donalee Citrin, NP as PCP - General (Family Medicine) Bensimhon, Bevelyn Buckles, MD as PCP - Advanced Heart Failure  (Cardiology) Lennette Bihari, MD as PCP - Cardiology (Cardiology) Lennette Bihari, MD as Consulting Physician (Cardiology) Laurey Morale, MD as Consulting Physician (Cardiology) [facility], [facility], [facility]  Indicate any recent Medical Services you may have received from other than [facility] providers in the past year (date may be approximate).     Assessment:   This is a routine wellness examination for [name].  Hearing/Vision screen Hearing Screening - Comments:: Has problem with hearing and daughter speaks for him Vision Screening - Comments:: Patient had appointment last week and will be going back in two months. Also patient is blind and uses eye drops.  Dietary issues and exercise activities discussed: Current Exercise Habits: The patient does not participate in regular exercise at present (wound on the leg), Exercise limited by: orthopedic condition(s)   Goals Addressed             This Visit's Progress    Patient Stated       Would like to exercise on exercise Bike at the club       Depression Screen    02/17/2023   12:47 PM 12/18/2021    2:52 PM 11/10/2021    1:18 PM  PHQ 2/9 Scores  PHQ - 2 Score 0 0 0  PHQ- 9 Score 0      Fall Risk    06/18/2022    9:52 AM 03/03/2022    8:40 AM 12/18/2021   11:48 AM 12/11/2021   10:21 AM 10/01/2021    3:38 PM  Fall Risk   Falls in the past year? 0 0 1 0 0  Number falls in past yr: 0 0 1 0 0  Injury with Fall? 0 0 1 0 0  Risk for fall due to : No Fall Risks No Fall Risks History of fall(s) No Fall Risks History of fall(s)  Follow up Falls evaluation completed Falls evaluation completed Falls evaluation completed;Education provided;Falls prevention discussed Falls evaluation completed Falls evaluation completed;Education provided;Falls prevention discussed    FALL RISK PREVENTION PERTAINING TO THE HOME:  Any stairs in or around the home? Yes  If so, are there any without handrails? No   Home free of loose throw rugs in walkways, pet beds, electrical cords, etc? No  Adequate lighting in your home to reduce risk of falls? Yes   ASSISTIVE DEVICES UTILIZED TO PREVENT FALLS:  Life alert? No  Use of a cane, walker or w/c? Yes  Grab bars in the bathroom? No  Shower chair or bench in shower? No  Elevated toilet seat or a handicapped toilet? No   TIMED UP AND GO:  Was the test performed? No .  Length of time to ambulate 10 feet: N/A sec.   Gait slow and steady with assistive device  Cognitive Function:  Immunizations Immunization History  Administered Date(s) Administered   Tdap 05/01/2016    TDAP status: Up to date  Flu Vaccine status: Up to date  Pneumococcal vaccine status: Due, Education has been provided regarding  the importance of this vaccine. Advised may receive this vaccine at local pharmacy or Health Dept. Aware to provide a copy of the vaccination record if obtained from local pharmacy or Health Dept. Verbalized acceptance and understanding.  Covid-19 vaccine status: Declined, Education has been provided regarding the importance of this vaccine but patient still declined. Advised may receive this vaccine at local pharmacy or Health Dept.or vaccine clinic. Aware to provide a copy of the vaccination record if obtained from local pharmacy or Health Dept. Verbalized acceptance and understanding.  Qualifies for Shingles Vaccine? No   Zostavax completed No   Shingrix Completed?: No.    Education has been provided regarding the importance of this vaccine. Patient has been advised to call insurance company to determine out of pocket expense if they have not yet received this vaccine. Advised may also receive vaccine at local pharmacy or Health Dept. Verbalized acceptance and understanding.  Screening Tests Health Maintenance  Topic Date Due   FOOT EXAM  03/10/2023 (Originally 09/21/1961)   Pneumonia Vaccine 84+ Years old (1 of 2 - PCV) 04/05/2023 (Originally  09/21/1957)   OPHTHALMOLOGY EXAM  08/10/2023 (Originally 09/21/1961)   INFLUENZA VACCINE  06/10/2023   HEMOGLOBIN A1C  06/30/2023   DTaP/Tdap/Td (2 - Td or Tdap) 05/01/2026   Hepatitis C Screening  Completed   HPV VACCINES  Aged Out   COLONOSCOPY (Pts 45-2yrs Insurance coverage will need to be confirmed)  Discontinued   COVID-19 Vaccine  Discontinued   Zoster Vaccines- Shingrix  Discontinued    Health Maintenance  There are no preventive care reminders to display for this patient.  Colorectal cancer screening: No longer required. Declined   Lung Cancer Screening: (Low Dose CT Chest recommended if Age 25-80 years, 30 pack-year currently smoking OR have quit w/in 15years.) does qualify.   Lung Cancer Screening Referral: declined   Additional Screening:  Hepatitis C Screening: does qualify; Completed yes   Vision Screening: Recommended annual ophthalmology exams for early detection of glaucoma and other disorders of the eye. Is the patient up to date with their annual eye exam?  Yes  Who is the provider or what is the name of the office in which the patient attends annual eye exams? [facility]  If pt is not established with a provider, would they like to be referred to a provider to establish care? No .   Dental Screening: Recommended annual dental exams for proper oral hygiene  Community Resource Referral / Chronic Care Management: CRR required this visit?  No   CCM required this visit?  No      Plan:     I have personally reviewed and noted the following in the patient's chart:   Medical and social history Use of alcohol, tobacco or illicit drugs  Current medications and supplements including opioid prescriptions. Patient is not currently taking opioid prescriptions. Functional ability and status Nutritional status Physical activity Advanced directives List of other physicians Hospitalizations, surgeries, and ER visits in previous 12  months Vitals Screenings to include cognitive, depression, and falls Referrals and appointments  In addition, I have reviewed and discussed with patient certain preventive protocols, quality metrics, and best practice recommendations. A written personalized care plan for preventive services as well as general preventive health recommendations were provided to patient.    Caesar Bookman, NP   02/21/2023   Nurse Notes: - declined colonoscopy and cologuard

## 2023-02-18 ENCOUNTER — Ambulatory Visit: Payer: Medicaid Other | Admitting: Family

## 2023-02-18 ENCOUNTER — Ambulatory Visit (INDEPENDENT_AMBULATORY_CARE_PROVIDER_SITE_OTHER): Payer: Medicare HMO | Admitting: Adult Health

## 2023-02-18 ENCOUNTER — Encounter: Payer: Self-pay | Admitting: Family

## 2023-02-18 ENCOUNTER — Encounter: Payer: Self-pay | Admitting: Adult Health

## 2023-02-18 VITALS — BP 138/72 | HR 49 | Temp 97.6°F | Resp 14 | Ht 68.0 in | Wt 214.0 lb

## 2023-02-18 DIAGNOSIS — N1832 Chronic kidney disease, stage 3b: Secondary | ICD-10-CM

## 2023-02-18 DIAGNOSIS — I5022 Chronic systolic (congestive) heart failure: Secondary | ICD-10-CM

## 2023-02-18 DIAGNOSIS — Z794 Long term (current) use of insulin: Secondary | ICD-10-CM | POA: Diagnosis not present

## 2023-02-18 DIAGNOSIS — S81801S Unspecified open wound, right lower leg, sequela: Secondary | ICD-10-CM

## 2023-02-18 DIAGNOSIS — L03115 Cellulitis of right lower limb: Secondary | ICD-10-CM

## 2023-02-18 DIAGNOSIS — I4891 Unspecified atrial fibrillation: Secondary | ICD-10-CM

## 2023-02-18 DIAGNOSIS — E1122 Type 2 diabetes mellitus with diabetic chronic kidney disease: Secondary | ICD-10-CM | POA: Diagnosis not present

## 2023-02-18 MED ORDER — BLOOD GLUCOSE METER KIT: PACK | 0 refills | Status: DC

## 2023-02-18 MED ORDER — ONETOUCH ULTRA VI STRP: ORAL_STRIP | 12 refills | Status: DC

## 2023-02-18 MED ORDER — DOXYCYCLINE HYCLATE 100 MG PO TABS: 100.0000 mg | ORAL_TABLET | Freq: Two times a day (BID) | ORAL | 0 refills | Status: AC

## 2023-02-18 MED ORDER — ONETOUCH ULTRASOFT LANCETS MISC: 12 refills | Status: DC

## 2023-02-18 MED ORDER — METOPROLOL SUCCINATE ER 50 MG PO TB24: 50.0000 mg | ORAL_TABLET | Freq: Every day | ORAL | 3 refills | Status: DC

## 2023-02-18 MED ORDER — METOPROLOL SUCCINATE ER 25 MG PO TB24: 25.0000 mg | ORAL_TABLET | Freq: Every day | ORAL | 3 refills | Status: DC

## 2023-02-18 MED ORDER — FUROSEMIDE 20 MG PO TABS: 30.0000 mg | ORAL_TABLET | Freq: Two times a day (BID) | ORAL | 2 refills | Status: DC

## 2023-02-18 MED ORDER — ONETOUCH ULTRA MINI W/DEVICE KIT: 1.0000 | PACK | Freq: Three times a day (TID) | 0 refills | Status: DC

## 2023-02-18 NOTE — Progress Notes (Signed)
[facility]  Provider:  Kenard Gower DNP  Code Status:  Full Code  Goals of Care:     02/17/2023   12:48 PM  Advanced Directives  Does Patient Have a Medical Advance Directive? No  Would patient like information on creating a medical advance directive? No - Patient declined     Chief Complaint  Patient presents with   Leg Swelling    Patient has some swelling in right leg, not water weight per cardiology. Wound still not healed all the way yet either.     HPI: Patient is a 72 y.o. male seen today for an acute visit for swelling of his right leg. He was accompanied by his daughter [name].He has right lower wound which has a brownish drainage with pungent smell. Daughter stated that she does treatment on the right leg daily. Right posterior lower leg has hard tissue.   Today, HR 49. He takes Metoprolol succinate 50 mg BID for atrial fibrillation.  He has right lower leg 2+edema. Daughter is requesting prescription for Lasix. He takes Lasix 30 mg BID for CHF. He denies shortness of breath.  He denies checking blood sugar at home since he stated that he does not have any glucose monitor. He has DM and has blurry vision.   Past Medical History:  Diagnosis Date   A-fib    a. (06/04/14) TEE-DC-CV; succesful; large LA 6.2 cm   ADHD    Ascites    Athlete's foot    Chronic systolic heart failure    a. 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, unspecified CKD stage, unspecified whether long term insulin use    Heart murmur    HTN (hypertension)    OSA (obstructive sleep apnea)    PVD (peripheral vascular disease)    s/p great toe amputation    Past Surgical History:  Procedure Laterality Date   ABDOMINAL AORTOGRAM W/LOWER EXTREMITY N/A 05/19/2021   Procedure: ABDOMINAL  AORTOGRAM W/LOWER EXTREMITY;  Surgeon: Cephus Shelling, MD;  Location: [facility];  Service: Cardiovascular;  Laterality: N/A;   ABDOMINAL AORTOGRAM W/LOWER EXTREMITY Right 06/11/2022   Procedure: ABDOMINAL AORTOGRAM W/LOWER EXTREMITY;  Surgeon: Cephus Shelling, MD;  Location: [facility];  Service: Cardiovascular;  Laterality: Right;   ABDOMINAL AORTOGRAM W/LOWER EXTREMITY Left 12/10/2022   Procedure: ABDOMINAL AORTOGRAM W/LOWER EXTREMITY;  Surgeon: Cephus Shelling, MD;  Location: [facility];  Service: Cardiovascular;  Laterality: Left;   AMPUTATION Left 05/22/2021   Procedure: LEFT FIFTH TOE AMPUTATION;  Surgeon: Cephus Shelling, MD;  Location: [facility] OR;  Service: Vascular;  Laterality: Left;   CARDIOVERSION N/A 06/04/2014   Procedure: CARDIOVERSION;  Surgeon: Laurey Morale, MD;  Location: [facility] ENDOSCOPY;  Service: Cardiovascular;  Laterality: N/A;   CARDIOVERSION N/A 11/06/2021   Procedure: CARDIOVERSION;  Surgeon: Laurey Morale, MD;  Location: [facility] ENDOSCOPY;  Service: Cardiovascular;  Laterality: N/A;   NOSE SURGERY     Nasal septum surgery   PERIPHERAL VASCULAR BALLOON ANGIOPLASTY  06/11/2022   Procedure: PERIPHERAL VASCULAR BALLOON ANGIOPLASTY;  Surgeon: Cephus Shelling, MD;  Location: [facility];  Service: Cardiovascular;;   PERIPHERAL VASCULAR BALLOON ANGIOPLASTY Left 12/10/2022   Procedure: PERIPHERAL VASCULAR BALLOON ANGIOPLASTY;  Surgeon: Cephus Shelling, MD;  Location:  [facility];  Service: Cardiovascular;  Laterality: Left;  Peroneal   PERIPHERAL VASCULAR INTERVENTION Left 05/19/2021   Procedure: PERIPHERAL VASCULAR INTERVENTION;  Surgeon: Cephus Shelling, MD;  Location: [facility];  Service: Cardiovascular;  Laterality: Left;   TEE WITHOUT CARDIOVERSION N/A 06/04/2014   Procedure: TRANSESOPHAGEAL ECHOCARDIOGRAM (TEE);  Surgeon: Laurey Morale, MD;  Location: [facility] ENDOSCOPY;  Service: Cardiovascular;  Laterality:  N/A;   TEE WITHOUT CARDIOVERSION N/A 01/06/2016   Procedure: TRANSESOPHAGEAL ECHOCARDIOGRAM (TEE);  Surgeon: Pricilla Riffle, MD;  Location: [facility] ENDOSCOPY;  Service: Cardiovascular;  Laterality: N/A;   TEE WITHOUT CARDIOVERSION N/A 11/06/2021   Procedure: TRANSESOPHAGEAL ECHOCARDIOGRAM (TEE);  Surgeon: Laurey Morale, MD;  Location: [facility] ENDOSCOPY;  Service: Cardiovascular;  Laterality: N/A;   VASECTOMY      No Known Allergies  Outpatient Encounter Medications as of 02/18/2023  Medication Sig   APPLE CIDER VINEGAR PO Take 30 mLs by mouth 4 (four) times a week.   blood glucose meter kit and supplies Dispense based on patient and insurance preference. Use up to four times daily as directed. (FOR ICD-10 E10.9, E11.9).   clopidogrel (PLAVIX) 75 MG tablet Take 1 tablet (75 mg total) by mouth daily.   diclofenac Sodium (VOLTAREN) 1 % GEL Apply 1 Application topically 4 (four) times daily as needed (pain).   empagliflozin (JARDIANCE) 10 MG TABS tablet TAKE 1 TABLET(10 MG) BY MOUTH DAILY   ENTRESTO 49-51 MG TAKE 1 TABLET BY MOUTH TWICE DAILY   furosemide (LASIX) 20 MG tablet Patient takes 1.5 tablet in the morning and 1.5 tablet at night.   insulin glargine (LANTUS) 100 UNIT/ML injection INJECT 8 UNITS UNDER THE SKIN DAILY   Insulin Syringe-Needle U-100 (INSULIN SYRINGE 1CC/30GX5/16") 30G X 5/16" 1 ML MISC Use once daily for insulin injections. Dx:E11.22   metoprolol succinate (TOPROL XL) 50 MG 24 hr tablet Take 1 tablet (50 mg total) by mouth 2 (two) times daily.   Misc Natural Products (JOINT SUPPORT PO) Take 1 Dose by mouth 3 (three) times a week.   spironolactone (ALDACTONE) 25 MG tablet Take 1 tablet (25 mg total) by mouth daily.   warfarin (COUMADIN) 6 MG tablet Take 0.5-1 tablets (3-6 mg total) by mouth See admin instructions. Take 6 mg on Mon and Fri Take 3 mg on Sun, Tues, Wed, Thurs, and Sat   No facility-administered encounter medications on file as of 02/18/2023.    Review of Systems:   Review of Systems  Constitutional:  Negative for activity change, appetite change and fever.  HENT:  Negative for sore throat.   Eyes:  Positive for visual disturbance.       Blurry vision  Cardiovascular:  Negative for chest pain and leg swelling.  Gastrointestinal:  Negative for abdominal distention, diarrhea and vomiting.  Genitourinary:  Negative for dysuria, frequency and urgency.  Skin:  Positive for wound. Negative for color change.  Neurological:  Negative for dizziness and headaches.  Psychiatric/Behavioral:  Negative for behavioral problems and sleep disturbance. The patient is not nervous/anxious.     Health Maintenance  Topic Date Due   FOOT EXAM  03/10/2023 (Originally 09/21/1961)   Pneumonia Vaccine 55+ Years old (1 of 2 - PCV) 04/05/2023 (Originally 09/21/1957)   OPHTHALMOLOGY EXAM  08/10/2023 (Originally 09/21/1961)   INFLUENZA VACCINE  06/10/2023   HEMOGLOBIN A1C  06/30/2023   DTaP/Tdap/Td (2 - Td or Tdap) 05/01/2026   Hepatitis C Screening  Completed   HPV VACCINES  Aged Out  COLONOSCOPY (Pts 45-24yrs Insurance coverage will need to be confirmed)  Discontinued   COVID-19 Vaccine  Discontinued   Zoster Vaccines- Shingrix  Discontinued    Physical Exam: Vitals:   02/18/23 1048  BP: 138/72  Pulse: (!) 49  Resp: 14  Temp: 97.6 F (36.4 C)  TempSrc: Temporal  SpO2: 95%  Weight: 214 lb (97.1 kg)  Height: 5\' 8"  (1.727 m)   Body mass index is 32.54 kg/m. Physical Exam Constitutional:      Appearance: Normal appearance.  HENT:     Head: Normocephalic and atraumatic.     Mouth/Throat:     Mouth: Mucous membranes are moist.  Eyes:     Conjunctiva/sclera: Conjunctivae normal.  Cardiovascular:     Rate and Rhythm: Normal rate. Rhythm irregular.     Pulses: Normal pulses.     Heart sounds: Normal heart sounds.  Pulmonary:     Effort: Pulmonary effort is normal.     Breath sounds: Normal breath sounds.  Abdominal:     General: Bowel sounds are normal.      Palpations: Abdomen is soft.  Musculoskeletal:        General: No swelling. Normal range of motion.     Cervical back: Normal range of motion.  Skin:    General: Skin is warm and dry.     Comments: Right lower leg wound with foul smelling minimal drainage  Neurological:     General: No focal deficit present.     Mental Status: He is alert and oriented to person, place, and time.  Psychiatric:        Mood and Affect: Mood normal.        Behavior: Behavior normal.        Thought Content: Thought content normal.        Judgment: Judgment normal.     Labs reviewed: Basic Metabolic Panel: Recent Labs    06/18/22 1029 12/02/22 1419 12/30/22 1353 01/13/23 0950 01/20/23 1125 02/10/23 1044  NA 140   < > 143 137 137 137  K 4.5   < > 4.1 3.8 4.6 4.6  CL 105   < > 106 103 103 102  CO2 24   < > 30 24 28 25   GLUCOSE 124*   < > 161* 109* 108* 141*  BUN 32*   < > 27* 38* 45* 42*  CREATININE 1.91*   < > 1.41* 1.85* 1.68* 1.87*  CALCIUM 9.0   < > 9.1 8.9 9.1 9.3  TSH 1.93  --  1.74  --   --   --    < > = values in this interval not displayed.   Liver Function Tests: Recent Labs    06/18/22 1029 12/02/22 1419 12/30/22 1353  AST 17 14 18   ALT 22 16 20   BILITOT 0.5 0.7 0.4  PROT 7.6 7.3 7.7   No results for input(s): "LIPASE", "AMYLASE" in the last 8760 hours. No results for input(s): "AMMONIA" in the last 8760 hours. CBC: Recent Labs    06/18/22 1029 12/02/22 1419 12/10/22 0627  WBC 9.3 7.4  --   NEUTROABS 7,133  --   --   HGB 13.1* 12.7* 14.3  HCT 39.7 37.9* 42.0  MCV 87.3 88.3  --   PLT 222 161  --    Lipid Panel: Recent Labs    06/18/22 1029 12/30/22 1353  CHOL 168 162  HDL 37* 39*  LDLCALC 111* 97  TRIG 96 166*  CHOLHDL 4.5 4.2  Lab Results  Component Value Date   HGBA1C 6.5 (H) 12/30/2022    Procedures since last visit: VAS [place] ABI WITH/WO TBI  Result Date: 02/02/2023  LOWER EXTREMITY DOPPLER STUDY Patient Name:  [name]  Date of  Exam:   02/02/2023 Medical Rec #: [id number]          Accession #:    [id number] Date of Birth: 1951/08/31         Patient Gender: M Patient Age:   66 years Exam Location:  [facility] Procedure:      VAS [place] ABI WITH/WO TBI Referring Phys: Lelon Mast RHYNE --------------------------------------------------------------------------------  Indications: Ulceration, peripheral artery disease, and left transmetatarsal              amputation. High Risk Factors: Hypertension.  Vascular Interventions: 12/10/22: left peroneal angioplasty.                         06/11/22: Right posterior tibial artery angioplasty via                         retrograde tibial access.                         05/19/21: Left peroneal angioplasty. Performing Technologist: Thereasa Parkin RVT  Examination Guidelines: A complete evaluation includes at minimum, Doppler waveform signals and systolic blood pressure reading at the level of bilateral brachial, anterior tibial, and posterior tibial arteries, when vessel segments are accessible. Bilateral testing is considered an integral part of a complete examination. Photoelectric Plethysmograph (PPG) waveforms and toe systolic pressure readings are included as required and additional duplex testing as needed. Limited examinations for reoccurring indications may be performed as noted.  ABI Findings: +---------+------------------+-----+----------+--------+ Right    Rt Pressure (mmHg)IndexWaveform  Comment  +---------+------------------+-----+----------+--------+ Brachial 147                                       +---------+------------------+-----+----------+--------+ PTA      94                0.64 monophasic         +---------+------------------+-----+----------+--------+ DP       73                0.50 monophasic         +---------+------------------+-----+----------+--------+ Great Toe37                0.25                     +---------+------------------+-----+----------+--------+ +---------+------------------+-----+----------+--------------------------------+ Left     Lt Pressure (mmHg)IndexWaveform  Comment                          +---------+------------------+-----+----------+--------------------------------+ Brachial 138                                                               +---------+------------------+-----+----------+--------------------------------+ PTA      132               0.90 monophasicaudible, uanble to obtain a  good                                           waveform                         +---------+------------------+-----+----------+--------------------------------+ DP       150               1.02 monophasic                                 +---------+------------------+-----+----------+--------------------------------+ Great Toe56                0.38                                            +---------+------------------+-----+----------+--------------------------------+ +-------+-----------+-----------+------------+------------+ ABI/TBIToday's ABIToday's TBIPrevious ABIPrevious TBI +-------+-----------+-----------+------------+------------+ Right  0.64       0.25       1.19        0.59         +-------+-----------+-----------+------------+------------+ Left   1.02       0.38       1.09        0.35         +-------+-----------+-----------+------------+------------+  Arterial wall calcification precludes accurate ankle pressures and ABIs. Previous ABI on 11/24/22 prior to most recent intervention.  Summary: Right: Resting right ankle-brachial index indicates moderate right lower extremity arterial disease. The right toe-brachial index is abnormal. Left: Resting left ankle-brachial index is within normal range. The left toe-brachial index is abnormal. *See table(s) above for measurements and observations.  Electronically signed by Coral Else MD on  02/02/2023 at 7:35:13 PM.    Final    VAS [place] LOWER EXTREMITY ARTERIAL DUPLEX  Result Date: 02/02/2023 LOWER EXTREMITY ARTERIAL DUPLEX STUDY Patient Name:  [name]  Date of Exam:   02/02/2023 Medical Rec #: [id number]          Accession #:    [id number] Date of Birth: 10-07-1951         Patient Gender: M Patient Age:   56 years Exam Location:  [facility] Procedure:      VAS [place] LOWER EXTREMITY ARTERIAL DUPLEX Referring Phys: Lelon Mast RHYNE --------------------------------------------------------------------------------  Indications: Ulceration, and peripheral artery disease. High Risk Factors: Hypertension.  Vascular Interventions: 12/10/22: left peroneal angioplasty.                         06/11/22: Right posterior tibial artery angioplasty via                         retrograde tibial access.                         05/19/21: Left peroneal angioplasty. Current ABI:            Right 0.64/0.25 Left 1.02/0.38 Performing Technologist: Thereasa Parkin RVT  Examination Guidelines: A complete evaluation includes B-mode imaging, spectral Doppler, color Doppler, and power Doppler as needed of all accessible portions of each vessel. Bilateral testing is considered an integral part of a complete examination. Limited  examinations for reoccurring indications may be performed as noted.  +-----------+--------+-----+--------+----------+--------------+ LEFT       PSV cm/sRatioStenosisWaveform  Comments       +-----------+--------+-----+--------+----------+--------------+ CFA Distal 84                   triphasic                +-----------+--------+-----+--------+----------+--------------+ DFA        61                   triphasic                +-----------+--------+-----+--------+----------+--------------+ SFA Prox   80                   triphasic                +-----------+--------+-----+--------+----------+--------------+ SFA Mid    45                   triphasic                 +-----------+--------+-----+--------+----------+--------------+ SFA Distal 45                   triphasic                +-----------+--------+-----+--------+----------+--------------+ POP Prox   46                   triphasic                +-----------+--------+-----+--------+----------+--------------+ POP Distal 149                  biphasic                 +-----------+--------+-----+--------+----------+--------------+ TP Trunk   85                                            +-----------+--------+-----+--------+----------+--------------+ ATA Distal 27                   monophasic               +-----------+--------+-----+--------+----------+--------------+ PTA Distal 18                                            +-----------+--------+-----+--------+----------+--------------+ PERO Prox                                 not visualized +-----------+--------+-----+--------+----------+--------------+ PERO Mid   90                   monophasic               +-----------+--------+-----+--------+----------+--------------+ PERO Distal107                  monophasic               +-----------+--------+-----+--------+----------+--------------+  Summary: Left: Proximal peroneal artery not visualized. Mid and distal peroneal appear patent.  See table(s) above for measurements and observations. Electronically signed by Coral ElseVance Brabham MD on 02/02/2023 at 7:31:30 PM.    Final     Assessment/Plan  1. Cellulitis of right  lower extremity - daughter cleanse right lower leg wound daily - doxycycline (VIBRA-TABS) 100 MG tablet; Take 1 tablet (100 mg total) by mouth 2 (two) times daily for 14 days.  Dispense: 28 tablet; Refill: 0 - AMB referral to wound care center - CBC With Differential/Platelet  2. Leg wound, right, sequela -  daughter cleanse right lower leg wound daily -  referred to wound care center  3. Chronic systolic heart failure - has  2+edema of right lower leg, no SOB - furosemide (LASIX) 20 MG tablet; Take 1.5 tablets (30 mg total) by mouth 2 (two) times daily. Patient takes 1.5 tablet in the morning and 1.5 tablet at night.  Dispense: 90 tablet; Refill: 2 -  follows up with cardiology  4. Type 2 diabetes mellitus with stage 3b chronic kidney disease, with long-term current use of insulin Lab Results  Component Value Date   HGBA1C 6.5 (H) 12/30/2022  -  does have glucose monitor at home, ordered today -  continue Jardiance and Lantus - Basic Metabolic Panel with eGFR - blood glucose meter kit and supplies; Dispense based on patient and insurance preference. Use up to four times daily as directed. (FOR ICD-10 E10.9, E11.9).  Dispense: 1 each; Refill: 0 - Lancets (ONETOUCH ULTRASOFT) lancets; Use as instructed to check blood sugar up to 3 times daily.  Dispense: 100 each; Refill: 12 - glucose blood (ONETOUCH ULTRA) test strip; Use as instructed to test up to three times daily.  Dispense: 100 each; Refill: 12 - Blood Glucose Monitoring Suppl (ONE TOUCH ULTRA MINI) w/Device KIT; 1 each by Does not apply route in the morning, at noon, and at bedtime.  Dispense: 1 kit; Refill: 0  5. Stage 3b chronic kidney disease -  GFR 38, creatinine 1.87, BUN 42 - Basic Metabolic Panel with eGFR  6. Atrial fibrillation with rapid ventricular response -  HR 49, will decrease bedtime dose od Metoprolol succinate from 50 mg to 25 mg and continue morning dose at 50 mg - metoprolol succinate (TOPROL XL) 50 MG 24 hr tablet; Take 1 tablet (50 mg total) by mouth daily.  Dispense: 30 tablet; Refill: 3 - metoprolol succinate (TOPROL-XL) 25 MG 24 hr tablet; Take 1 tablet (25 mg total) by mouth at bedtime.  Dispense: 30 tablet; Refill: 3 -  follow up with cardiology   Labs/tests ordered:  CBC and BMP  Next appt:  06/30/2023

## 2023-02-19 ENCOUNTER — Telehealth: Payer: Self-pay | Admitting: *Deleted

## 2023-02-19 LAB — BASIC METABOLIC PANEL WITH GFR
BUN/Creatinine Ratio: 22 (calc) (ref 6–22)
BUN: 39 mg/dL — ABNORMAL HIGH (ref 7–25)
CO2: 28 mmol/L (ref 20–32)
Calcium: 9.4 mg/dL (ref 8.6–10.3)
Chloride: 105 mmol/L (ref 98–110)
Creat: 1.75 mg/dL — ABNORMAL HIGH (ref 0.70–1.28)
Glucose, Bld: 117 mg/dL — ABNORMAL HIGH (ref 65–99)
Potassium: 4.8 mmol/L (ref 3.5–5.3)
Sodium: 140 mmol/L (ref 135–146)
eGFR: 41 mL/min/{1.73_m2} — ABNORMAL LOW (ref >=60)

## 2023-02-19 LAB — CBC WITH DIFFERENTIAL/PLATELET
Absolute Monocytes: 576 cells/uL (ref 200–950)
Basophils Absolute: 40 cells/uL (ref 0–200)
Basophils Relative: 0.6 %
Eosinophils Absolute: 188 cells/uL (ref 15–500)
Eosinophils Relative: 2.8 %
HCT: 39.1 % (ref 38.5–50.0)
Hemoglobin: 13 g/dL — ABNORMAL LOW (ref 13.2–17.1)
Lymphs Abs: 1374 cells/uL (ref 850–3900)
MCH: 30.1 pg (ref 27.0–33.0)
MCHC: 33.2 g/dL (ref 32.0–36.0)
MCV: 90.5 fL (ref 80.0–100.0)
MPV: 11.6 fL (ref 7.5–12.5)
Monocytes Relative: 8.6 %
Neutro Abs: 4523 cells/uL (ref 1500–7800)
Neutrophils Relative %: 67.5 %
Platelets: 160 10*3/uL (ref 140–400)
RBC: 4.32 10*6/uL (ref 4.20–5.80)
RDW: 13.9 % (ref 11.0–15.0)
Total Lymphocyte: 20.5 %
WBC: 6.7 10*3/uL (ref 3.8–10.8)

## 2023-02-19 NOTE — Telephone Encounter (Signed)
[name] with [facility] called and stated that she was returning someone's call regarding Diabetic Retinopathy and patient was seen 12/2022 but was NOT assessed for Diabetic Retinopathy. He was seen for Eye Hemorrhaging.

## 2023-02-21 NOTE — Patient Instructions (Signed)
[name] , Thank you for taking time to come for your Medicare Wellness Visit. I appreciate your ongoing commitment to your health goals. Please review the following plan we discussed and let me know if I can assist you in the future.   Screening recommendations/referrals: Colonoscopy: Declined  Recommended yearly ophthalmology/optometry visit for glaucoma screening and checkup Recommended yearly dental visit for hygiene and checkup  Vaccinations: Influenza vaccine due annually in September/October Pneumococcal vaccine : Due  Tdap vaccine : Up to date  Shingles vaccine: declined     Advanced directives: No   Conditions/risks identified: advanced age (>40men, >12 women);male gender;sedentary lifestyle;diabetes mellitus;hypertension;obesity (BMI >30kg/m2);smoking/ tobacco exposure  Next appointment: 1 year   Preventive Care 106 Years and Older, Male Preventive care refers to lifestyle choices and visits with your health care provider that can promote health and wellness. What does preventive care include? A yearly physical exam. This is also called an annual well check. Dental exams once or twice a year. Routine eye exams. Ask your health care provider how often you should have your eyes checked. Personal lifestyle choices, including: Daily care of your teeth and gums. Regular physical activity. Eating a healthy diet. Avoiding tobacco and drug use. Limiting alcohol use. Practicing safe sex. Taking low doses of aspirin every day. Taking vitamin and mineral supplements as recommended by your health care provider. What happens during an annual well check? The services and screenings done by your health care provider during your annual well check will depend on your age, overall health, lifestyle risk factors, and family history of disease. Counseling  Your health care provider may ask you questions about your: Alcohol use. Tobacco use. Drug use. Emotional well-being. Home and  relationship well-being. Sexual activity. Eating habits. History of falls. Memory and ability to understand (cognition). Work and work Astronomer. Screening  You may have the following tests or measurements: Height, weight, and BMI. Blood pressure. Lipid and cholesterol levels. These may be checked every 5 years, or more frequently if you are over 34 years old. Skin check. Lung cancer screening. You may have this screening every year starting at age 13 if you have a 30-pack-year history of smoking and currently smoke or have quit within the past 15 years. Fecal occult blood test (FOBT) of the stool. You may have this test every year starting at age 43. Flexible sigmoidoscopy or colonoscopy. You may have a sigmoidoscopy every 5 years or a colonoscopy every 10 years starting at age 85. Prostate cancer screening. Recommendations will vary depending on your family history and other risks. Hepatitis C blood test. Hepatitis B blood test. Sexually transmitted disease (STD) testing. Diabetes screening. This is done by checking your blood sugar (glucose) after you have not eaten for a while (fasting). You may have this done every 1-3 years. Abdominal aortic aneurysm (AAA) screening. You may need this if you are a current or former smoker. Osteoporosis. You may be screened starting at age 63 if you are at high risk. Talk with your health care provider about your test results, treatment options, and if necessary, the need for more tests. Vaccines  Your health care provider may recommend certain vaccines, such as: Influenza vaccine. This is recommended every year. Tetanus, diphtheria, and acellular pertussis (Tdap, Td) vaccine. You may need a Td booster every 10 years. Zoster vaccine. You may need this after age 10. Pneumococcal 13-valent conjugate (PCV13) vaccine. One dose is recommended after age 40. Pneumococcal polysaccharide (PPSV23) vaccine. One dose is recommended after age  64. Talk to your  health care provider about which screenings and vaccines you need and how often you need them. This information is not intended to replace advice given to you by your health care provider. Make sure you discuss any questions you have with your health care provider. Document Released: 11/22/2015 Document Revised: 07/15/2016 Document Reviewed: 08/27/2015 Elsevier Interactive Patient Education  2017 [place] Prevention in the Home Falls can cause injuries. They can happen to people of all ages. There are many things you can do to make your home safe and to help prevent falls. What can I do on the outside of my home? Regularly fix the edges of walkways and driveways and fix any cracks. Remove anything that might make you trip as you walk through a door, such as a raised step or threshold. Trim any bushes or trees on the path to your home. Use bright outdoor lighting. Clear any walking paths of anything that might make someone trip, such as rocks or tools. Regularly check to see if handrails are loose or broken. Make sure that both sides of any steps have handrails. Any raised decks and porches should have guardrails on the edges. Have any leaves, snow, or ice cleared regularly. Use sand or salt on walking paths during winter. Clean up any spills in your garage right away. This includes oil or grease spills. What can I do in the bathroom? Use night lights. Install grab bars by the toilet and in the tub and shower. Do not use towel bars as grab bars. Use non-skid mats or decals in the tub or shower. If you need to sit down in the shower, use a plastic, non-slip stool. Keep the floor dry. Clean up any water that spills on the floor as soon as it happens. Remove soap buildup in the tub or shower regularly. Attach bath mats securely with double-sided non-slip rug tape. Do not have throw rugs and other things on the floor that can make you trip. What can I do in the bedroom? Use night  lights. Make sure that you have a light by your bed that is easy to reach. Do not use any sheets or blankets that are too big for your bed. They should not hang down onto the floor. Have a firm chair that has side arms. You can use this for support while you get dressed. Do not have throw rugs and other things on the floor that can make you trip. What can I do in the kitchen? Clean up any spills right away. Avoid walking on wet floors. Keep items that you use a lot in easy-to-reach places. If you need to reach something above you, use a strong step stool that has a grab bar. Keep electrical cords out of the way. Do not use floor polish or wax that makes floors slippery. If you must use wax, use non-skid floor wax. Do not have throw rugs and other things on the floor that can make you trip. What can I do with my stairs? Do not leave any items on the stairs. Make sure that there are handrails on both sides of the stairs and use them. Fix handrails that are broken or loose. Make sure that handrails are as long as the stairways. Check any carpeting to make sure that it is firmly attached to the stairs. Fix any carpet that is loose or worn. Avoid having throw rugs at the top or bottom of the stairs. If you do have  throw rugs, attach them to the floor with carpet tape. Make sure that you have a light switch at the top of the stairs and the bottom of the stairs. If you do not have them, ask someone to add them for you. What else can I do to help prevent falls? Wear shoes that: Do not have high heels. Have rubber bottoms. Are comfortable and fit you well. Are closed at the toe. Do not wear sandals. If you use a stepladder: Make sure that it is fully opened. Do not climb a closed stepladder. Make sure that both sides of the stepladder are locked into place. Ask someone to hold it for you, if possible. Clearly mark and make sure that you can see: Any grab bars or handrails. First and last  steps. Where the edge of each step is. Use tools that help you move around (mobility aids) if they are needed. These include: Canes. Walkers. Scooters. Crutches. Turn on the lights when you go into a dark area. Replace any light bulbs as soon as they burn out. Set up your furniture so you have a clear path. Avoid moving your furniture around. If any of your floors are uneven, fix them. If there are any pets around you, be aware of where they are. Review your medicines with your doctor. Some medicines can make you feel dizzy. This can increase your chance of falling. Ask your doctor what other things that you can do to help prevent falls. This information is not intended to replace advice given to you by your health care provider. Make sure you discuss any questions you have with your health care provider. Document Released: 08/22/2009 Document Revised: 04/02/2016 Document Reviewed: 11/30/2014 Elsevier Interactive Patient Education  2017 [employer].

## 2023-02-22 NOTE — Progress Notes (Signed)
-    CBC and BMP is stable, almost same as previous.

## 2023-02-23 ENCOUNTER — Ambulatory Visit (INDEPENDENT_AMBULATORY_CARE_PROVIDER_SITE_OTHER): Payer: Medicare HMO | Admitting: Podiatry

## 2023-02-23 ENCOUNTER — Encounter (HOSPITAL_BASED_OUTPATIENT_CLINIC_OR_DEPARTMENT_OTHER): Payer: Medicare HMO | Attending: Internal Medicine | Admitting: Internal Medicine

## 2023-02-23 DIAGNOSIS — I110 Hypertensive heart disease with heart failure: Secondary | ICD-10-CM | POA: Diagnosis not present

## 2023-02-23 DIAGNOSIS — E1122 Type 2 diabetes mellitus with diabetic chronic kidney disease: Secondary | ICD-10-CM

## 2023-02-23 DIAGNOSIS — G4733 Obstructive sleep apnea (adult) (pediatric): Secondary | ICD-10-CM | POA: Diagnosis not present

## 2023-02-23 DIAGNOSIS — E1151 Type 2 diabetes mellitus with diabetic peripheral angiopathy without gangrene: Secondary | ICD-10-CM | POA: Insufficient documentation

## 2023-02-23 DIAGNOSIS — I5022 Chronic systolic (congestive) heart failure: Secondary | ICD-10-CM | POA: Diagnosis not present

## 2023-02-23 DIAGNOSIS — E11622 Type 2 diabetes mellitus with other skin ulcer: Secondary | ICD-10-CM | POA: Diagnosis not present

## 2023-02-23 DIAGNOSIS — M79674 Pain in right toe(s): Secondary | ICD-10-CM | POA: Diagnosis not present

## 2023-02-23 DIAGNOSIS — L97811 Non-pressure chronic ulcer of other part of right lower leg limited to breakdown of skin: Secondary | ICD-10-CM | POA: Diagnosis not present

## 2023-02-23 DIAGNOSIS — I739 Peripheral vascular disease, unspecified: Secondary | ICD-10-CM

## 2023-02-23 DIAGNOSIS — Z794 Long term (current) use of insulin: Secondary | ICD-10-CM | POA: Insufficient documentation

## 2023-02-23 DIAGNOSIS — B351 Tinea unguium: Secondary | ICD-10-CM

## 2023-02-23 DIAGNOSIS — N1832 Chronic kidney disease, stage 3b: Secondary | ICD-10-CM

## 2023-02-23 DIAGNOSIS — E1140 Type 2 diabetes mellitus with diabetic neuropathy, unspecified: Secondary | ICD-10-CM | POA: Insufficient documentation

## 2023-02-23 DIAGNOSIS — L97813 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: Secondary | ICD-10-CM | POA: Diagnosis not present

## 2023-02-23 DIAGNOSIS — M79675 Pain in left toe(s): Secondary | ICD-10-CM

## 2023-02-23 NOTE — Progress Notes (Signed)
[name], WADDING A ([id number]) [id number] Page 1 of 8 Visit Report for 02/23/2023 Chief Complaint Document Details Patient Name: Date of Service: O [name] [name] [facility] A. 02/23/2023 1:15 PM Medical Record Number: [id number] Patient Account Number: [id number] Date of Birth/Sex: Treating RN: 05-09-1951 (72 y.o. M) Primary Care Provider: Richarda Blade Other Clinician: Referring Provider: Treating Provider/Extender: Velna Ochs, [name] in Treatment: 31 Information Obtained from: Patient Chief Complaint 07/16/2022; bilateral lower extremity wounds Electronic Signature(s) Signed: 02/23/2023 2:11:27 PM By: Geralyn Corwin DO Entered By: Geralyn Corwin on 02/23/2023 13:54:59 -------------------------------------------------------------------------------- HPI Details Patient Name: Date of Service: [name] [name] 02/23/2023 1:15 PM Medical Record Number: [id number] Patient Account Number: [id number] Date of Birth/Sex: Treating RN: Dec 30, 1950 (72 y.o. M) Primary Care Provider: Richarda Blade Other Clinician: Referring Provider: Treating Provider/Extender: Velna Ochs, [name] in Treatment: 31 History of Present Illness HPI Description: Admission 07/16/2022 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 He is not sure how the wounds started. He has been following with podiatry for this issue. He had removal of the tendon to the right lower extremity by Dr. Ralene Cork. He has been using Betadine to the wound beds. On 06/11/2022 he had a right posterior tibial artery  angioplasty by Dr. Chestine Spore. It was reported the patient is optimized after revascularization of the right lower extremity. His ABIs on the left were 0.91. He currently denies systemic signs of infection. He also does not wear shoes. He came in with Kerlix wrap to his feet bilaterally. This did not completely cover his feet. 07/23/2022: This is a patient of Dr. Neita Garnet that she asked me to take a look at last week due to the significant involvement of the muscle and tendon on his right posterior leg wound. He needed an aggressive debridement and asked me if I would be able to perform this on her behalf. The patient is here today for that procedure. When his dressing was removed in clinic today, the wound was teeming with maggots. There is necrotic muscle, tendon, and fat, with a thick layer of slough on the wound. 9/21; patient presents for follow-up. He was debrided by Dr. Lady Gary at last clinic visit without any issues. He has been placing Dakin's wet-to-dry dressings to the right posterior wound. He has been using Medihoney to the left foot wound. He reports improvement in wound healing. He has no issues or complaints today. 9/28; patient with type 2 diabetes PAD status post revascularization. He underwent retrograde right PTA angioplasty. Last saw Dr. Chestine Spore on 9/12 and was felt to have a brisk posterior tibial signal at the right ankle. He had a pulsatile toe tracing that appeared adequate for healing. He has been using Santyl and Hydrofera Blue on the right Achilles area. He has a smaller area on the left fifth plantar metatarsal head They were apparently in the ER on 9/23 with swelling and discoloration above the wrap. They have a picture of the leg after the wrap was taken off which looks like that it was excessively tight superiorly 10/6; patient presents for follow-up. We have been using Santyl and Hydrofera Blue to the right lower extremity wound under  Kerlix/Coban. T the left foot we o have  been using silver alginate with Medihoney. He again comes in with no shoes. He has no issues or complaints today. 10/12; patient presents for follow-up. We have been using Santyl and Hydrofera Blue to the right lower extremity under Kerlix/Coban And silver alginate to the left foot wound. He has no issues today. He denies signs of infection. 10/19; patient presents for follow-up. We continue to use Santyl and Hydrofera Blue to the right lower extremity under Kerlix/Coban and silver alginate to the left foot wound. There is been improvement in wound healing. 10/26; patient presents for follow-up. We have been using Santyl and Hydrofera Blue to the right lower extremity under Kerlix/Coban and silver alginate to the left foot. There continues to be improvement in wound healing. Patient has no issues or complaints today. 11/2; the patient's area on the right Achilles heel looks quite healthy and is improved in terms of measurements. We have been using Hydrofera Blue. He is approved for Grafix On the left plantar foot wound over the fifth metatarsal head this tunnels over the lateral part of the met head. He is not offloading this at all SHERI, PROWS A ([id number]) [id number] Page 2 of 8 11/16; patient presents for follow-up. He has been using a surgical shoe with an offloading felt pad to the left lateral foot along with silver alginate. He has been approved for Grafix and this is available for placement today T the right lower extremity wound. Patient Is agreeable to this. We have been using Santyl and o Hydrofera Blue under Kerlix/Coban to this area. 11/21; patient presents for follow-up. We have been doing Grafix to the right lower extremity and silver alginate to the left foot wound. He has no issues or complaints today. 11/30; patient presents for follow-up. We have been doing Grafix to the right lower extremity wound and Medihoney with Hydrofera Blue to the left  lateral foot wound. He reports pain to the left foot wound today. He did not obtain his x-ray. 12/11; patient presents for follow-up. He has been using Dakin's wet-to-dry dressings to the left plantar foot wound. We have been doing Grafix to the right lower extremity wound under compression therapy. He obtained his x-ray that showed mild periosteal elevation at the resection site with the possibility of osteomyelitis. He currently denies systemic signs of infection. He has been taking doxycycline and Augmentin without issues. He is getting his INR checked by his primary care office. 12/18; patient presents for follow-up. He has been using Dakin's wet-to-dry dressings to the left foot wound. We have been placing Grafix under compression therapy to the right lower extremity. He is currently taking doxycycline and Augmentin. He denies signs of infection. 12/26; patient presents for follow-up. He has been using Dakin's wet-to-dry dressings to the left foot. He has been off oral antibiotics for potential bone biopsy. We have been placing Grafix under compression therapy to the right lower extremity. There has been improvement in wound healing to both sites. He states he is trying to aggressively offload the left foot. He currently denies signs of infection. 1/2; patient presents for follow-up. He has been doing Dakin's wet-to-dry dressings to the left foot. He saw Dr. Ralene Cork today and had a bone biopsy. Debridement was performed on the site. Dressing in place with Dakin's wet-to-dry. T the right leg we have been using Grafix under compression therapy. He o has no issues or complaints today. 1/9; patient presents for follow-up. We have been using  Hydrofera Blue under compression therapy to the right lower extremity wound. This is well-healing. He has been using Dakin's wet-to-dry dressings to the left lateral foot wound. He has been taking ciprofloxacin due to culture results without issues. He  was referred to infectious disease by podiatry. He does not have an appointment yet. 1/16; patient presents for follow-up. We were using collagen under Kerlix/Coban to the right lower extremity wound. He has been approved for Grafix and this was available for placement today. He continues to use Dakin's wet-to-dry dressings daily to the left foot. He started levofloxacin. He has an appointment with infectious disease on 1/24. 1/23; patient presents for follow-up. Grafix was placed in standard fashion to the right lower extremity wound last week. He has been using Dakin's wet-to-dry dressings to the left foot. He has been taking levofloxacin. He has an infectious disease appointment tomorrow as well as a pulmonology appointment. He followed up with vein and vascular on 1/16 and plan is for angiogram of the left lower extremity. He has a history of left peroneal angioplasty in 2022. 1/30; patient presents for follow-up. Grafix was placed in standard fashion to the right lower extremity wound last week. There has been improvement in healing here. He has been using Dakin's wet-to-dry dressings to the left foot. Patient saw infectious disease, Dr. Earlene Plater on 1/24 and plan is for PICC line on 2/5 with cefepime. For now he is taking levofloxacin. He has been cleared by pulmonology for HBO. Due to extensive heart history we have asked HBO clearance from his cardiologist as well. He has follow-up with them tomorrow. He currently has no issues or complaints today. 2/6; patient presents for follow-up. He started HBO therapy today and had no issues with his dive. He had an arteriogram on 2/1 by Dr. Chestine Spore. He had angioplasty of the left peroneal artery. He is optimized from a vascular standpoint. We have been using Grafix to the right lower extremity wound and Dakin's wet-to-dry dressings to the left foot wound. He started his IV antibiotics yesterday. 2/13; patient presents for follow-up. We have been using  Grafix to the right lower extremity wound under compression therapy and Dakin's wet-to-dry dressings to the left lateral wound. He is continued IV antibiotics without issues. 2/20; Patient presents for follow-up. We have been using Grafix to the right lower extremity wound under compression and Dakin's wet-to-dry dressings to the left lateral foot wound. Per patient since he cannot do two courses of IV antibiotics daily plan is to switch to oral antibiotics. Patient has not done HBO since 2/15. Plan is to be evaluated by ophthalmology to assure there are no issues with his vision. 2/27; patient presents for follow-up. We have been using Grafix to the right lower extremity under compression therapy and Dakin's wet-to-dry dressings to the left lateral foot wound. He has switched over to oral antibiotics for the left foot with chronic osteomyelitis. He is scheduled to see ophthalmology later this week to be cleared for HBO. 3/7; patient presents for follow-up. We have been using Hydrofera Blue under Kerlix/Coban to the right lower extremity. We have been using Dakin's wet-to-dry dressings to the left lateral foot. This wound has healed. 3/14; patient presents for follow-up. We have been using endoform with antibiotic ointment under Kerlix/Coban to the right lower extremity. His wound is healed. 02/23/2023 Patient was last seen 1 month ago. At that time as his wounds were healed. He presents today because he is concerned about the posterior right leg reopening. He  had dried lymph fluid that has come off and now there is moisture to the skin. There is only a couple very small areas that have some skin breakdown but overall the area is still mostly epithelialized. He has been keeping the area covered. Electronic Signature(s) Signed: 02/23/2023 2:11:27 PM By: Geralyn Corwin DO Entered By: Geralyn Corwin on 02/23/2023  13:57:05 -------------------------------------------------------------------------------- Physical Exam Details Patient Name: Date of Service: [name] [name] [facility] A. 02/23/2023 1:15 PM Medical Record Number: [id number] Patient Account Number: [id number] Date of Birth/Sex: Treating RN: [birth year] (72 y.o. M) Primary Care Provider: Richarda Blade Other Clinician: Referring Provider: Treating Provider/Extender: Velna Ochs, [name] in Treatment: [address] [name], [name] A ([id number]) [id number] Page 3 of 8 respirations regular, non-labored and within target range for patient.. Cardiovascular 2+ dorsalis pedis/posterior tibialis pulses. Psychiatric pleasant and cooperative. Notes Right lower extremity: T the posterior aspect there are very small scattered areas of skin breakdown. Area is macerated. No signs of infection including o increased warmth, erythema purulent drainage. Electronic Signature(s) Signed: 02/23/2023 2:11:27 PM By: Geralyn Corwin DO Entered By: Geralyn Corwin on 02/23/2023 13:57:56 -------------------------------------------------------------------------------- Physician Orders Details Patient Name: Date of Service: [name] [name] A. 02/23/2023 1:15 PM Medical Record Number: [id number] Patient Account Number: [id number] Date of Birth/Sex: Treating RN: 1951-01-19 (72 y.o. [name] Primary Care Provider: Richarda Blade Other Clinician: Referring Provider: Treating Provider/Extender: Geralyn Corwin Ngetich, [name] in Treatment: 42 Verbal / Phone Orders: No Diagnosis Coding Follow-up Appointments ppointment in 2 weeks. - w/ Dr. Mikey Bussing and Maryruth Bun Rm # 9 Tuesday 03/09/23 @ 11:00 Return A Anesthetic (In clinic) Topical Lidocaine 5% applied to wound bed Bathing/ Shower/ Hygiene May shower with protection but do not get wound dressing(s) wet. Protect dressing(s) with water repellant  cover (for example, large plastic bag) or a cast cover and may then take shower. Wound Treatment Wound #4 - Achilles Wound Laterality: Right, Posterior Cleanser: Wound Cleanser (DME) (Generic) 1 x Per Day/15 Days Discharge Instructions: Cleanse the wound with wound cleanser prior to applying a clean dressing using gauze sponges, not tissue or cotton balls. Peri-Wound Care: Skin Prep (DME) (Generic) 1 x Per Day/15 Days Discharge Instructions: Use skin prep as directed Prim Dressing: Maxorb Extra Ag+ Alginate Dressing, 4x4.75 (in/in) (DME) (Generic) 1 x Per Day/15 Days ary Discharge Instructions: Apply to wound bed as instructed Secondary Dressing: Woven Gauze Sponge, Non-Sterile 4x4 in (DME) (Generic) 1 x Per Day/15 Days Discharge Instructions: Apply over primary dressing as directed. Secondary Dressing: Zetuvit Plus Silicone Border Dressing 4x4 (in/in) (DME) (Generic) 1 x Per Day/15 Days Discharge Instructions: Apply silicone border over primary dressing as directed. Patient Medications llergies: No Known Drug Allergies A Notifications Medication Indication Start End PRN debridement/pain lidocaine DOSE topical 5 % gel - gel topical once daily Electronic Signature(s) Signed: 02/23/2023 2:11:27 PM By: Geralyn Corwin DO Entered By: Geralyn Corwin on 02/23/2023 13:58:05 [name] ([id number]) [id number] Page 4 of 8 -------------------------------------------------------------------------------- Problem List Details Patient Name: Date of Service: [name] [name] [facility] A. 02/23/2023 1:15 PM Medical Record Number: [id number] Patient Account Number: [id number] Date of Birth/Sex: Treating RN: [birth year] (72 y.o. 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 07/23/2022 No Yes muscle E11.622 Type 2 diabetes mellitus with other skin ulcer  02/23/2023 No Yes I73.9 Peripheral vascular disease, unspecified 07/16/2022 No Yes Inactive Problems Resolved Problems ICD-10 Code Description Active Date Resolved Date L97.518 Non-pressure chronic ulcer of other part of right foot with other specified severity 07/16/2022 07/16/2022 L97.522 Non-pressure chronic ulcer of other part of left foot with fat layer exposed 07/16/2022 07/16/2022 E11.621 Type 2 diabetes mellitus with foot ulcer 07/16/2022 07/16/2022 Z61.096 Other chronic osteomyelitis, left ankle and foot 11/10/2022 11/10/2022 Electronic Signature(s) Signed: 02/23/2023 2:11:27 PM By: Geralyn Corwin DO Entered By: Geralyn Corwin on 02/23/2023 14:04:32 -------------------------------------------------------------------------------- Progress Note Details Patient Name: Date of Service: [name] [name] 02/23/2023 1:15 PM Medical Record Number: [id number] Patient Account Number: [id number] Date of Birth/Sex: Treating RN: [birth year] (72 y.o. M) Primary Care Provider: Richarda Blade Other Clinician: Referring Provider: Treating Provider/Extender: Velna Ochs, [name] in Treatment: 31 Subjective Chief Complaint Information obtained from Patient 07/16/2022; bilateral lower extremity wounds History of Present Illness (HPI) Admission 07/16/2022 Mr. Azariah Bonura is a 72 year old male with a past medical history of insulin-dependent currently controlled type 2 diabetes complicated by peripheral JAKOBIE, HENSLEE A ([id number]) [id number] Page 5 of 8 neuropathy, chronic systolic heart failure, obstructive sleep apnea, and peripheral vascular disease that presents to the clinic for a several month history of nonhealing ulcer to the back of the right leg and a 65-month history of  nonhealing wounds to the left foot. He is not sure how the wounds started. He has been following with podiatry for this issue. He had removal of the tendon to the right lower extremity by Dr. Ralene Cork. He has been using Betadine to the wound beds. On 06/11/2022 he had a right posterior tibial artery angioplasty by Dr. Chestine Spore. It was reported the patient is optimized after revascularization of the right lower extremity. His ABIs on the left were 0.91. He currently denies systemic signs of infection. He also does not wear shoes. He came in with Kerlix wrap to his feet bilaterally. This did not completely cover his feet. 07/23/2022: This is a patient of Dr. Neita Garnet that she asked me to take a look at last week due to the significant involvement of the muscle and tendon on his right posterior leg wound. He needed an aggressive debridement and asked me if I would be able to perform this on her behalf. The patient is here today for that procedure. When his dressing was removed in clinic today, the wound was teeming with maggots. There is necrotic muscle, tendon, and fat, with a thick layer of slough on the wound. 9/21; patient presents for follow-up. He was debrided by Dr. Lady Gary at last clinic visit without any issues. He has been placing Dakin's wet-to-dry dressings to the right posterior wound. He has been using Medihoney to the left foot wound. He reports improvement in wound healing. He has no issues or complaints today. 9/28; patient with type 2 diabetes PAD status post revascularization. He underwent retrograde right PTA angioplasty. Last saw Dr. Chestine Spore on 9/12 and was felt to have a brisk posterior tibial signal at the right ankle. He had a pulsatile toe tracing that appeared adequate for healing. He has been using Santyl and Hydrofera Blue on the right Achilles area. oo He has a smaller area on the left fifth plantar metatarsal head They were apparently in the ER on 9/23 with swelling and  discoloration above the wrap. They have a picture of the leg after the wrap was taken off which looks like that it was excessively tight superiorly 10/6;  patient presents for follow-up. We have been using Santyl and Hydrofera Blue to the right lower extremity wound under Kerlix/Coban. T the left foot we o have been using silver alginate with Medihoney. He again comes in with no shoes. He has no issues or complaints today. 10/12; patient presents for follow-up. We have been using Santyl and Hydrofera Blue to the right lower extremity under Kerlix/Coban And silver alginate to the left foot wound. He has no issues today. He denies signs of infection. 10/19; patient presents for follow-up. We continue to use Santyl and Hydrofera Blue to the right lower extremity under Kerlix/Coban and silver alginate to the left foot wound. There is been improvement in wound healing. 10/26; patient presents for follow-up. We have been using Santyl and Hydrofera Blue to the right lower extremity under Kerlix/Coban and silver alginate to the left foot. There continues to be improvement in wound healing. Patient has no issues or complaints today. 11/2; the patient's area on the right Achilles heel looks quite healthy and is improved in terms of measurements. We have been using Hydrofera Blue. He is approved for Grafix On the left plantar foot wound over the fifth metatarsal head this tunnels over the lateral part of the met head. He is not offloading this at all 11/16; patient presents for follow-up. He has been using a surgical shoe with an offloading felt pad to the left lateral foot along with silver alginate. He has been approved for Grafix and this is available for placement today T the right lower extremity wound. Patient Is agreeable to this. We have been using Santyl and o Hydrofera Blue under Kerlix/Coban to this area. 11/21; patient presents for follow-up. We have been doing Grafix to the right lower extremity  and silver alginate to the left foot wound. He has no issues or complaints today. 11/30; patient presents for follow-up. We have been doing Grafix to the right lower extremity wound and Medihoney with Hydrofera Blue to the left lateral foot wound. He reports pain to the left foot wound today. He did not obtain his x-ray. 12/11; patient presents for follow-up. He has been using Dakin's wet-to-dry dressings to the left plantar foot wound. We have been doing Grafix to the right lower extremity wound under compression therapy. He obtained his x-ray that showed mild periosteal elevation at the resection site with the possibility of osteomyelitis. He currently denies systemic signs of infection. He has been taking doxycycline and Augmentin without issues. He is getting his INR checked by his primary care office. 12/18; patient presents for follow-up. He has been using Dakin's wet-to-dry dressings to the left foot wound. We have been placing Grafix under compression therapy to the right lower extremity. He is currently taking doxycycline and Augmentin. He denies signs of infection. 12/26; patient presents for follow-up. He has been using Dakin's wet-to-dry dressings to the left foot. He has been off oral antibiotics for potential bone biopsy. We have been placing Grafix under compression therapy to the right lower extremity. There has been improvement in wound healing to both sites. He states he is trying to aggressively offload the left foot. He currently denies signs of infection. 1/2; patient presents for follow-up. He has been doing Dakin's wet-to-dry dressings to the left foot. He saw Dr. Ralene Cork today and had a bone biopsy. Debridement was performed on the site. Dressing in place with Dakin's wet-to-dry. T the right leg we have been using Grafix under compression therapy. He o has no issues or complaints today.  1/9; patient presents for follow-up. We have been using Hydrofera Blue under compression  therapy to the right lower extremity wound. This is well-healing. He has been using Dakin's wet-to-dry dressings to the left lateral foot wound. He has been taking ciprofloxacin due to culture results without issues. He was referred to infectious disease by podiatry. He does not have an appointment yet. 1/16; patient presents for follow-up. We were using collagen under Kerlix/Coban to the right lower extremity wound. He has been approved for Grafix and this was available for placement today. He continues to use Dakin's wet-to-dry dressings daily to the left foot. He started levofloxacin. He has an appointment with infectious disease on 1/24. 1/23; patient presents for follow-up. Grafix was placed in standard fashion to the right lower extremity wound last week. He has been using Dakin's wet-to-dry dressings to the left foot. He has been taking levofloxacin. He has an infectious disease appointment tomorrow as well as a pulmonology appointment. He followed up with vein and vascular on 1/16 and plan is for angiogram of the left lower extremity. He has a history of left peroneal angioplasty in 2022. 1/30; patient presents for follow-up. Grafix was placed in standard fashion to the right lower extremity wound last week. There has been improvement in healing here. He has been using Dakin's wet-to-dry dressings to the left foot. Patient saw infectious disease, Dr. Earlene Plater on 1/24 and plan is for PICC line on 2/5 with cefepime. For now he is taking levofloxacin. He has been cleared by pulmonology for HBO. Due to extensive heart history we have asked HBO clearance from his cardiologist as well. He has follow-up with them tomorrow. He currently has no issues or complaints today. 2/6; patient presents for follow-up. He started HBO therapy today and had no issues with his dive. He had an arteriogram on 2/1 by Dr. Chestine Spore. He had angioplasty of the left peroneal artery. He is optimized from a vascular standpoint. We  have been using Grafix to the right lower extremity wound and Dakin's wet-to-dry dressings to the left foot wound. He started his IV antibiotics yesterday. 2/13; patient presents for follow-up. We have been using Grafix to the right lower extremity wound under compression therapy and Dakin's wet-to-dry dressings to the left lateral wound. He is continued IV antibiotics without issues. 2/20; Patient presents for follow-up. We have been using Grafix to the right lower extremity wound under compression and Dakin's wet-to-dry dressings to the CORION, SHERROD A ([id number]) [id number] Page 6 of 8 left lateral foot wound. Per patient since he cannot do two courses of IV antibiotics daily plan is to switch to oral antibiotics. Patient has not done HBO since 2/15. Plan is to be evaluated by ophthalmology to assure there are no issues with his vision. 2/27; patient presents for follow-up. We have been using Grafix to the right lower extremity under compression therapy and Dakin's wet-to-dry dressings to the left lateral foot wound. He has switched over to oral antibiotics for the left foot with chronic osteomyelitis. He is scheduled to see ophthalmology later this week to be cleared for HBO. 3/7; patient presents for follow-up. We have been using Hydrofera Blue under Kerlix/Coban to the right lower extremity. We have been using Dakin's wet-to-dry dressings to the left lateral foot. This wound has healed. 3/14; patient presents for follow-up. We have been using endoform with antibiotic ointment under Kerlix/Coban to the right lower extremity. His wound is healed. 02/23/2023 Patient was last seen 1 month ago. At that time as  his wounds were healed. He presents today because he is concerned about the posterior right leg reopening. He had dried lymph fluid that has come off and now there is moisture to the skin. There is only a couple very small areas that have some skin breakdown  but overall the area is still mostly epithelialized. He has been keeping the area covered. Patient History Information obtained from Chart. Family History Diabetes - Mother, Heart Disease - Mother,Siblings. Social History Current every day smoker, Alcohol Use - Never, Drug Use - No History, Caffeine Use - Never. Medical History Respiratory Patient has history of Sleep Apnea - does not wear Cardiovascular Patient has history of Arrhythmia - A. Fib, Congestive Heart Failure - EF 25%, Hypertension, Peripheral Arterial Disease Denies history of Peripheral Venous Disease Endocrine Patient has history of Type II Diabetes Hospitalization/Surgery History - Angiogram 06/11/2022 Dr. Chestine Spore [facility]. - cardioversion 11/06/2021, 2015, 2017. 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 Medical A Surgical History Notes nd Gastrointestinal Ascites Genitourinary stage III Kidney disease Objective Constitutional respirations regular, non-labored and within target range for patient.. Vitals Time Taken: 1:25 PM, Height: 68 in, Weight: 185 lbs, BMI: 28.1, Temperature: 98.1 F, Pulse: 121 bpm, Respiratory Rate: 20 breaths/min, Blood Pressure: 157/90 mmHg. Cardiovascular 2+ dorsalis pedis/posterior tibialis pulses. Psychiatric pleasant and cooperative. General Notes: Right lower extremity: T the posterior aspect there are very small scattered areas of skin breakdown. Area is macerated. No signs of infection o including increased warmth, erythema purulent drainage. Integumentary (Hair, Skin) Wound #4 status is Open. Original cause of wound was Gradually Appeared. The date acquired was: 02/23/2023. The wound is located on the Right,Posterior Achilles. The wound measures 3cm length x 2cm width x 0.1cm depth; 4.712cm^2 area and 0.471cm^3 volume. There is no tunneling or undermining noted. There is a medium amount of serosanguineous drainage noted. The wound margin is distinct with the outline attached to the wound  base. There is large (67- 100%) red, pink granulation within the wound bed. There is no necrotic tissue within the wound bed. The periwound skin appearance did not exhibit: Callus, Crepitus, Excoriation, Induration, Rash, Scarring, Dry/Scaly, Maceration, Atrophie Blanche, Cyanosis, Ecchymosis, Hemosiderin Staining, Mottled, Pallor, Rubor, Erythema. Periwound temperature was noted as No Abnormality. The periwound has tenderness on palpation. 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 T the previous wound site there is some maceration that I suspect was o caused by trapping moisture from dried lymph fluid that has since been removed. There are only a few small areas of skin breakdown. At this time I recommended silver alginate daily. Plan Follow-up Appointments: Return Appointment in 2 weeks. - w/ Dr. Mikey Bussing and Maryruth Bun Rm # 9 Tuesday 03/09/23 @ 11:00 Anesthetic: (In clinic) Topical Lidocaine 5% applied to wound bed Bathing/ Shower/ Hygiene: May shower with protection but do not get wound dressing(s) wet. Protect dressing(s) with water repellant cover (for example, large plastic bag) or a cast cover and may then take shower. The following medication(s) was prescribed: lidocaine topical 5 % gel gel topical once daily for PRN debridement/pain was prescribed at facility WOUND #4: - Achilles Wound Laterality: Right, Posterior Cleanser: Wound Cleanser (DME) (Generic) 1 x Per Day/15 Days Discharge Instructions: Cleanse the wound with wound cleanser prior  to applying a clean dressing using gauze sponges, not tissue or cotton balls. Peri-Wound Care: Skin Prep (DME) (Generic) 1  x Per Day/15 Days Discharge Instructions: Use skin prep as directed Prim Dressing: Maxorb Extra Ag+ Alginate Dressing, 4x4.75 (in/in) (DME) (Generic) 1 x Per Day/15 Days ary Discharge Instructions: Apply to wound bed as instructed Secondary Dressing: Woven Gauze Sponge, Non-Sterile 4x4 in (DME) (Generic) 1 x Per Day/15 Days Discharge Instructions: Apply over primary dressing as directed. Secondary Dressing: Zetuvit Plus Silicone Border Dressing 4x4 (in/in) (DME) (Generic) 1 x Per Day/15 Days Discharge Instructions: Apply silicone border over primary dressing as directed. 1. Silver alginate 2. Follow up in two weeks Electronic Signature(s) Signed: 02/23/2023 2:11:27 PM By: Geralyn Corwin DO Entered By: Geralyn Corwin on 02/23/2023 14:04:52 -------------------------------------------------------------------------------- HxROS Details Patient Name: Date of Service: [name] [name] 02/23/2023 1:15 PM Medical Record Number: [id number] Patient Account Number: [id number] Date of Birth/Sex: Treating RN: 22-Mar-1951 (72 y.o. M) Primary Care Provider: Richarda Blade Other Clinician: Referring Provider: Treating Provider/Extender: Geralyn Corwin Ngetich, [name] in Treatment: 31 Information Obtained From Chart Respiratory Medical History: Positive for: Sleep Apnea - does not wear Cardiovascular Medical History: Positive for: Arrhythmia - A. Fib; Congestive Heart Failure - EF 25%; Hypertension; Peripheral Arterial Disease Negative for: Peripheral Venous Disease Gastrointestinal Medical History: Past Medical History Notes: Ascites Endocrine Medical History: Positive for: Type II Diabetes [name], [name] A ([id number]) [id number] Page 8 of 8 Time with diabetes: 6 years Treated with: Insulin Blood sugar tested every day: No Genitourinary Medical History: Past Medical History Notes: stage III Kidney disease Immunizations Pneumococcal  Vaccine: Received Pneumococcal Vaccination: No Implantable Devices No devices added Hospitalization / Surgery History Type of Hospitalization/Surgery Angiogram 06/11/2022 Dr. 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 02/23/2023 Medical Record Number: [id number] Patient Account Number: [id number] Date of Birth/Sex: Treating RN: 1951/02/28 (72 y.o. 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 E11.622 Type 2 diabetes mellitus with other skin ulcer I73.9 Peripheral vascular disease, unspecified Facility Procedures : CPT4 Code: 41324401 Description: 99213 - WOUND CARE VISIT-LEV 3 EST PT Modifier: Quantity: 1 Physician Procedures : CPT4 Code Description Modifier [id number] 99213 - WC PHYS LEVEL 3 - EST PT ICD-10 Diagnosis Description 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

## 2023-02-23 NOTE — Progress Notes (Signed)
Subjective:  Patient ID: [name], male    DOB: 1950-12-12,   MRN: [id number]  Chief Complaint  Patient presents with   Wound Check    Wound check    Nail Problem    Routine foot care    72 y.o. male presents concern of thickened elongated and painful nails that are difficult to trim. Requesting to have them trimmed today. Relates burning and tingling in their feet. Patient is diabetic and last A1c was  Lab Results  Component Value Date   HGBA1C 6.5 (H) 12/30/2022   .  Patient here with daughter who relates wounds have been doing well and was released from wound care. She was concerned about some drainage and saw PCP and he was given doxycycline and advised to follow-up with wound care to make sure everything ok.   PCP:  Ngetich, Donalee Citrin, NP    Denies any other pedal complaints. Denies n/v/f/c.   Past Medical History:  Diagnosis Date   A-fib    a. (06/04/14) TEE-DC-CV; succesful; large LA 6.2 cm   ADHD    Ascites    Athlete's foot    Chronic systolic heart failure    a. 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, unspecified CKD stage, unspecified whether long term insulin use    Heart murmur    HTN (hypertension)    OSA (obstructive sleep apnea)    PVD (peripheral vascular disease)    s/p great toe amputation    Objective:  Physical Exam: Vascular: DP/PT pulses 2/4 bilateral. CFT <3 seconds. Normal hair growth on digits. No edema.  Skin. No lacerations or abrasions bilateral feet. Right heel wound appears to be healed. Some mild drainage noted likely from venous insufficency and swelling. Right foot wound healed. No erythema edema or purulence noted. Nails 1-5 bilateral are thickened elongated and dystrophic with subungual debris.  Musculoskeletal: MMT 5/5 bilateral  lower extremities in DF, PF, Inversion and Eversion. Deceased ROM in DF of ankle joint.  Neurological: Sensation intact to light touch.   ABIs:   Summary:  Right: Resting right ankle-brachial index indicates moderate right lower  extremity arterial disease. The right toe-brachial index is abnormal.   Left: Resting left ankle-brachial index indicates mild left lower  extremity arterial disease. The left toe-brachial index is abnormal.   Assessment:   1. Pain due to onychomycosis of toenails of both feet   2. Type 2 diabetes mellitus with stage 3b chronic kidney disease, with long-term current use of insulin   3. PVD (peripheral vascular disease)              Plan:  Patient was evaluated and treated and all questions answered. Previous wound healed.  -Discussed and educated patient on diabetic foot care, especially with  regards to the vascular, neurological and musculoskeletal systems.  -Stressed the importance of good glycemic control and the detriment of not  controlling glucose levels in relation to the foot. -Discussed supportive shoes at all times and checking feet regularly.  -Mechanically debrided all nails 1-5 bilateral using sterile nail nipper and filed with dremel without incident  -Advised to finish out antibitoics given. Advised to keep some gauze and slight compression on right heel to prevent any reulceration.  -Answered all patient questions -Discussed DM shoes and  will get appointment for fitting.  -Patient to return  in 3 months for at risk foot care -Patient advised to call the office if any problems or questions arise in the meantime.           Return in about 3 months (around 05/25/2023) for rfc.     Return in about 3 months (around 05/25/2023) for rfc.   Louann Sjogren, DPM

## 2023-02-24 ENCOUNTER — Ambulatory Visit: Payer: Medicare HMO | Attending: Cardiovascular Disease | Admitting: *Deleted

## 2023-02-24 DIAGNOSIS — I483 Typical atrial flutter: Secondary | ICD-10-CM | POA: Diagnosis not present

## 2023-02-24 DIAGNOSIS — L97522 Non-pressure chronic ulcer of other part of left foot with fat layer exposed: Secondary | ICD-10-CM | POA: Diagnosis not present

## 2023-02-24 DIAGNOSIS — I4891 Unspecified atrial fibrillation: Secondary | ICD-10-CM | POA: Diagnosis not present

## 2023-02-24 DIAGNOSIS — L97813 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: Secondary | ICD-10-CM | POA: Diagnosis not present

## 2023-02-24 DIAGNOSIS — Z5181 Encounter for therapeutic drug level monitoring: Secondary | ICD-10-CM

## 2023-02-24 DIAGNOSIS — I739 Peripheral vascular disease, unspecified: Secondary | ICD-10-CM | POA: Diagnosis not present

## 2023-02-24 DIAGNOSIS — I513 Intracardiac thrombosis, not elsewhere classified: Secondary | ICD-10-CM

## 2023-02-24 DIAGNOSIS — S81801A Unspecified open wound, right lower leg, initial encounter: Secondary | ICD-10-CM | POA: Diagnosis not present

## 2023-02-24 DIAGNOSIS — E11621 Type 2 diabetes mellitus with foot ulcer: Secondary | ICD-10-CM | POA: Diagnosis not present

## 2023-02-24 LAB — POCT INR: INR: 2.1 (ref 2.0–3.0)

## 2023-02-24 NOTE — Patient Instructions (Addendum)
Description   Continue taking warfarin 1/2 tablet daily except for 1 tablet on Mondays, Wednesdays, and Fridays. Recheck INR 2 weeks.  [facility] [phone].

## 2023-02-25 ENCOUNTER — Encounter: Payer: Self-pay | Admitting: Adult Health

## 2023-02-25 ENCOUNTER — Ambulatory Visit (INDEPENDENT_AMBULATORY_CARE_PROVIDER_SITE_OTHER): Payer: Medicare HMO | Admitting: Adult Health

## 2023-02-25 ENCOUNTER — Other Ambulatory Visit: Payer: Self-pay | Admitting: Family

## 2023-02-25 VITALS — BP 120/80 | HR 69 | Temp 98.4°F | Resp 18 | Ht 68.0 in | Wt 216.4 lb

## 2023-02-25 DIAGNOSIS — I4891 Unspecified atrial fibrillation: Secondary | ICD-10-CM

## 2023-02-25 DIAGNOSIS — Z794 Long term (current) use of insulin: Secondary | ICD-10-CM

## 2023-02-25 DIAGNOSIS — E1122 Type 2 diabetes mellitus with diabetic chronic kidney disease: Secondary | ICD-10-CM

## 2023-02-25 DIAGNOSIS — I5022 Chronic systolic (congestive) heart failure: Secondary | ICD-10-CM | POA: Diagnosis not present

## 2023-02-25 DIAGNOSIS — N1832 Chronic kidney disease, stage 3b: Secondary | ICD-10-CM

## 2023-02-25 DIAGNOSIS — I739 Peripheral vascular disease, unspecified: Secondary | ICD-10-CM | POA: Diagnosis not present

## 2023-02-25 DIAGNOSIS — L03115 Cellulitis of right lower limb: Secondary | ICD-10-CM | POA: Diagnosis not present

## 2023-02-25 NOTE — Progress Notes (Unsigned)
[facility] clinic  Provider:  Kenard Gower DNP  Code Status:  Full Code  Goals of Care:     02/17/2023   12:48 PM  Advanced Directives  Does Patient Have a Medical Advance Directive? No  Would patient like information on creating a medical advance directive? No - Patient declined     Chief Complaint  Patient presents with   Follow-up    1 week BP check heart rate     HPI: Patient is a 72 y.o. male seen today for follow up on BP and HR. He  Past Medical History:  Diagnosis Date   A-fib    a. (06/04/14) TEE-DC-CV; succesful; large LA 6.2 cm   ADHD    Ascites    Athlete's foot    Chronic systolic heart failure    a. 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, unspecified CKD stage, unspecified whether long term insulin use    Heart murmur    HTN (hypertension)    OSA (obstructive sleep apnea)    PVD (peripheral vascular disease)    s/p great toe amputation    Past Surgical History:  Procedure Laterality Date   ABDOMINAL AORTOGRAM W/LOWER EXTREMITY N/A 05/19/2021   Procedure: ABDOMINAL AORTOGRAM W/LOWER EXTREMITY;  Surgeon: Cephus Shelling, MD;  Location: [facility];  Service: Cardiovascular;  Laterality: N/A;   ABDOMINAL AORTOGRAM W/LOWER EXTREMITY Right 06/11/2022   Procedure: ABDOMINAL AORTOGRAM W/LOWER EXTREMITY;  Surgeon: Cephus Shelling, MD;  Location: [facility];  Service: Cardiovascular;  Laterality: Right;   ABDOMINAL AORTOGRAM W/LOWER EXTREMITY Left 12/10/2022   Procedure: ABDOMINAL AORTOGRAM W/LOWER EXTREMITY;  Surgeon: Cephus Shelling, MD;  Location: [facility];  Service: Cardiovascular;  Laterality: Left;   AMPUTATION Left 05/22/2021   Procedure: LEFT FIFTH TOE AMPUTATION;  Surgeon: Cephus Shelling, MD;  Location: [facility] OR;   Service: Vascular;  Laterality: Left;   CARDIOVERSION N/A 06/04/2014   Procedure: CARDIOVERSION;  Surgeon: Laurey Morale, MD;  Location: [facility] ENDOSCOPY;  Service: Cardiovascular;  Laterality: N/A;   CARDIOVERSION N/A 11/06/2021   Procedure: CARDIOVERSION;  Surgeon: Laurey Morale, MD;  Location: [facility] ENDOSCOPY;  Service: Cardiovascular;  Laterality: N/A;   NOSE SURGERY     Nasal septum surgery   PERIPHERAL VASCULAR BALLOON ANGIOPLASTY  06/11/2022   Procedure: PERIPHERAL VASCULAR BALLOON ANGIOPLASTY;  Surgeon: Cephus Shelling, MD;  Location: [facility];  Service: Cardiovascular;;   PERIPHERAL VASCULAR BALLOON ANGIOPLASTY Left 12/10/2022   Procedure: PERIPHERAL VASCULAR BALLOON ANGIOPLASTY;  Surgeon: Cephus Shelling, MD;  Location: [facility];  Service: Cardiovascular;  Laterality: Left;  Peroneal   PERIPHERAL VASCULAR INTERVENTION Left 05/19/2021   Procedure: PERIPHERAL VASCULAR INTERVENTION;  Surgeon: Cephus Shelling, MD;  Location: [facility];  Service: Cardiovascular;  Laterality: Left;   TEE WITHOUT CARDIOVERSION N/A 06/04/2014   Procedure: TRANSESOPHAGEAL ECHOCARDIOGRAM (TEE);  Surgeon: Laurey Morale, MD;  Location: [facility] ENDOSCOPY;  Service: Cardiovascular;  Laterality: N/A;   TEE WITHOUT CARDIOVERSION N/A 01/06/2016   Procedure: TRANSESOPHAGEAL ECHOCARDIOGRAM (TEE);  Surgeon: Pricilla Riffle, MD;  Location: [facility] ENDOSCOPY;  Service: Cardiovascular;  Laterality: N/A;   TEE WITHOUT CARDIOVERSION N/A 11/06/2021   Procedure: TRANSESOPHAGEAL ECHOCARDIOGRAM (TEE);  Surgeon: Laurey Morale, MD;  Location: [facility]  ENDOSCOPY;  Service: Cardiovascular;  Laterality: N/A;   VASECTOMY      No Known Allergies  Outpatient Encounter Medications as of 02/25/2023  Medication Sig   APPLE CIDER VINEGAR PO Take 30 mLs by mouth 4 (four) times a week.   blood glucose meter kit and supplies Dispense based on patient and insurance preference. Use up to four times daily as directed. (FOR  ICD-10 E10.9, E11.9).   Blood Glucose Monitoring Suppl (ONE TOUCH ULTRA MINI) w/Device KIT 1 each by Does not apply route in the morning, at noon, and at bedtime.   clopidogrel (PLAVIX) 75 MG tablet Take 1 tablet (75 mg total) by mouth daily.   diclofenac Sodium (VOLTAREN) 1 % GEL Apply 1 Application topically 4 (four) times daily as needed (pain).   doxycycline (VIBRA-TABS) 100 MG tablet Take 1 tablet (100 mg total) by mouth 2 (two) times daily for 14 days.   empagliflozin (JARDIANCE) 10 MG TABS tablet TAKE 1 TABLET(10 MG) BY MOUTH DAILY   ENTRESTO 49-51 MG TAKE 1 TABLET BY MOUTH TWICE DAILY   furosemide (LASIX) 20 MG tablet Take 1.5 tablets (30 mg total) by mouth 2 (two) times daily. Patient takes 1.5 tablet in the morning and 1.5 tablet at night.   glucose blood (ONETOUCH ULTRA) test strip Use as instructed to test up to three times daily.   insulin glargine (LANTUS) 100 UNIT/ML injection INJECT 8 UNITS UNDER THE SKIN DAILY   Insulin Syringe-Needle U-100 (TRUEPLUS INSULIN SYRINGE) 30G X 5/16" 1 ML MISC USE ONCE DAILY FOR INSULIN INJECTIONS.   Lancets (ONETOUCH ULTRASOFT) lancets Use as instructed to check blood sugar up to 3 times daily.   metoprolol succinate (TOPROL XL) 50 MG 24 hr tablet Take 1 tablet (50 mg total) by mouth daily.   metoprolol succinate (TOPROL-XL) 25 MG 24 hr tablet Take 1 tablet (25 mg total) by mouth at bedtime.   Misc Natural Products (JOINT SUPPORT PO) Take 1 Dose by mouth 3 (three) times a week.   spironolactone (ALDACTONE) 25 MG tablet Take 1 tablet (25 mg total) by mouth daily.   warfarin (COUMADIN) 6 MG tablet Take 0.5-1 tablets (3-6 mg total) by mouth See admin instructions. Take 6 mg on Mon and Fri Take 3 mg on Sun, Tues, Wed, Thurs, and Sat   [DISCONTINUED] Insulin Syringe-Needle U-100 (INSULIN SYRINGE 1CC/30GX5/16") 30G X 5/16" 1 ML MISC Use once daily for insulin injections. Dx:E11.22   No facility-administered encounter medications on file as of 02/25/2023.     Review of Systems:  Review of Systems  Constitutional:  Negative for activity change, appetite change and fever.  HENT:  Negative for sore throat.   Eyes: Negative.   Cardiovascular:  Negative for chest pain and leg swelling.  Gastrointestinal:  Negative for abdominal distention, diarrhea and vomiting.  Genitourinary:  Negative for dysuria, frequency and urgency.  Skin:  Negative for color change.  Neurological:  Negative for dizziness and headaches.  Psychiatric/Behavioral:  Negative for behavioral problems and sleep disturbance. The patient is not nervous/anxious.     Health Maintenance  Topic Date Due   FOOT EXAM  03/10/2023 (Originally 09/21/1961)   Pneumonia Vaccine 50+ Years old (1 of 2 - PCV) 04/05/2023 (Originally 09/21/1957)   OPHTHALMOLOGY EXAM  08/10/2023 (Originally 09/21/1961)   INFLUENZA VACCINE  06/10/2023   HEMOGLOBIN A1C  06/30/2023   DTaP/Tdap/Td (2 - Td or Tdap) 05/01/2026   Hepatitis C Screening  Completed   HPV VACCINES  Aged Out   COLONOSCOPY (Pts  45-1yrs Insurance coverage will need to be confirmed)  Discontinued   COVID-19 Vaccine  Discontinued   Zoster Vaccines- Shingrix  Discontinued    Physical Exam: Vitals:   02/25/23 1435  BP: 120/80  Pulse: 69  Resp: 18  Temp: 98.4 F (36.9 C)  SpO2: 96%  Weight: 216 lb 6 oz (98.1 kg)  Height:  (1.727 m)   Body mass index is 32.9 kg/m. Physical Exam Constitutional:      Appearance: He is obese.  HENT:     Head: Normocephalic and atraumatic.     Mouth/Throat:     Mouth: Mucous membranes are moist.  Eyes:     Conjunctiva/sclera: Conjunctivae normal.  Cardiovascular:     Rate and Rhythm: Normal rate. Rhythm irregular.     Pulses: Normal pulses.     Heart sounds: Normal heart sounds.  Pulmonary:     Effort: Pulmonary effort is normal.     Breath sounds: Normal breath sounds.  Abdominal:     General: Bowel sounds are normal.     Palpations: Abdomen is soft.  Musculoskeletal:         General: No swelling. Normal range of motion.     Cervical back: Normal range of motion.  Skin:    General: Skin is warm and dry.     Comments: Right back of foot wound scabbed, still wet with clear drainage  Neurological:     General: No focal deficit present.     Mental Status: He is alert and oriented to person, place, and time.  Psychiatric:        Mood and Affect: Mood normal.        Behavior: Behavior normal.        Thought Content: Thought content normal.        Judgment: Judgment normal.     Labs reviewed: Basic Metabolic Panel: Recent Labs    06/18/22 1029 12/02/22 1419 12/30/22 1353 01/13/23 0950 01/20/23 1125 02/10/23 1044 02/18/23 1139  NA 140   < > 143   < > 137 137 140  K 4.5   < > 4.1   < > 4.6 4.6 4.8  CL 105   < > 106   < > 103 102 105  CO2 24   < > 30   < > GLUCOSE 124*   < > 161*   < > 108* 141* 117*  BUN 32*   < > 27*   < > 45* 42* 39*  CREATININE 1.91*   < > 1.41*   < > 1.68* 1.87* 1.75*  CALCIUM 9.0   < > 9.1   < > 9.1 9.3 9.4  TSH 1.93  --  1.74  --   --   --   --    < > = values in this interval not displayed.   Liver Function Tests: Recent Labs    06/18/22 1029 12/02/22 1419 12/30/22 1353  AST ALT BILITOT 0.5 0.7 0.4  PROT 7.6 7.3 7.7   No results for input(s): "LIPASE", "AMYLASE" in the last 8760 hours. No results for input(s): "AMMONIA" in the last 8760 hours. CBC: Recent Labs    06/18/22 1029 12/02/22 1419 12/10/22 0627 02/18/23 1139  WBC 9.3 7.4  --  6.7  NEUTROABS 7,133  --   --  4,523  HGB 13.1* 12.7* 14.3 13.0*  HCT 39.7 37.9* 42.0 39.1  MCV 87.3 88.3  --  90.5  PLT 222 161  --  160   Lipid Panel: Recent Labs    06/18/22 1029 12/30/22 1353  CHOL 168 162  HDL 37* 39*  LDLCALC 111* 97  TRIG 96 166*  CHOLHDL 4.5 4.2   Lab Results  Component Value Date   HGBA1C 6.5 (H) 12/30/2022    Procedures since last visit: VAS [place] ABI WITH/WO TBI  Result Date: 02/02/2023  LOWER EXTREMITY  DOPPLER STUDY Patient Name:  [name]  Date of Exam:   02/02/2023 Medical Rec #: [id number]          Accession #:    [id number] Date of Birth: 13-Dec-1950         Patient Gender: M Patient Age:   72 years Exam Location:  [facility] Procedure:      VAS [place] ABI WITH/WO TBI Referring Phys: Lelon Mast RHYNE --------------------------------------------------------------------------------  Indications: Ulceration, peripheral artery disease, and left transmetatarsal              amputation. High Risk Factors: Hypertension.  Vascular Interventions: 12/10/22: left peroneal angioplasty.                         06/11/22: Right posterior tibial artery angioplasty via                         retrograde tibial access.                         05/19/21: Left peroneal angioplasty. Performing Technologist: Thereasa Parkin RVT  Examination Guidelines: A complete evaluation includes at minimum, Doppler waveform signals and systolic blood pressure reading at the level of bilateral brachial, anterior tibial, and posterior tibial arteries, when vessel segments are accessible. Bilateral testing is considered an integral part of a complete examination. Photoelectric Plethysmograph (PPG) waveforms and toe systolic pressure readings are included as required and additional duplex testing as needed. Limited examinations for reoccurring indications may be performed as noted.  ABI Findings: +---------+------------------+-----+----------+--------+ Right    Rt Pressure (mmHg)IndexWaveform  Comment  +---------+------------------+-----+----------+--------+ Brachial 147                                       +---------+------------------+-----+----------+--------+ PTA      94                0.64 monophasic         +---------+------------------+-----+----------+--------+ DP       73                0.50 monophasic         +---------+------------------+-----+----------+--------+ Great Toe37                0.25                     +---------+------------------+-----+----------+--------+ +---------+------------------+-----+----------+--------------------------------+ Left     Lt Pressure (mmHg)IndexWaveform  Comment                          +---------+------------------+-----+----------+--------------------------------+ Brachial 138                                                               +---------+------------------+-----+----------+--------------------------------+  PTA      132               0.90 monophasicaudible, uanble to obtain a good                                           waveform                         +---------+------------------+-----+----------+--------------------------------+ DP       150               1.02 monophasic                                 +---------+------------------+-----+----------+--------------------------------+ Great Toe56                0.38                                            +---------+------------------+-----+----------+--------------------------------+ +-------+-----------+-----------+------------+------------+ ABI/TBIToday's ABIToday's TBIPrevious ABIPrevious TBI +-------+-----------+-----------+------------+------------+ Right  0.64       0.25       1.19        0.59         +-------+-----------+-----------+------------+------------+ Left   1.02       0.38       1.09        0.35         +-------+-----------+-----------+------------+------------+  Arterial wall calcification precludes accurate ankle pressures and ABIs. Previous ABI on 11/24/22 prior to most recent intervention.  Summary: Right: Resting right ankle-brachial index indicates moderate right lower extremity arterial disease. The right toe-brachial index is abnormal. Left: Resting left ankle-brachial index is within normal range. The left toe-brachial index is abnormal. *See table(s) above for measurements and observations.  Electronically signed by Coral Else MD on 02/02/2023 at 7:35:13 PM.    Final    VAS [place] LOWER EXTREMITY ARTERIAL DUPLEX  Result Date: 02/02/2023 LOWER EXTREMITY ARTERIAL DUPLEX STUDY Patient Name:  [name]  Date of Exam:   02/02/2023 Medical Rec #: [id number]          Accession #:    [id number] Date of Birth: 07-09-1951         Patient Gender: M Patient Age:   85 years Exam Location:  [facility] Procedure:      VAS [place] LOWER EXTREMITY ARTERIAL DUPLEX Referring Phys: Lelon Mast RHYNE --------------------------------------------------------------------------------  Indications: Ulceration, and peripheral artery disease. High Risk Factors: Hypertension.  Vascular Interventions: 12/10/22: left peroneal angioplasty.                         06/11/22: Right posterior tibial artery angioplasty via                         retrograde tibial access.                         05/19/21: Left peroneal angioplasty. Current ABI:            Right 0.64/0.25 Left 1.02/0.38 Performing Technologist: Thereasa Parkin RVT  Examination Guidelines: A complete evaluation includes B-mode imaging, spectral  Doppler, color Doppler, and power Doppler as needed of all accessible portions of each vessel. Bilateral testing is considered an integral part of a complete examination. Limited examinations for reoccurring indications may be performed as noted.  +-----------+--------+-----+--------+----------+--------------+ LEFT       PSV cm/sRatioStenosisWaveform  Comments       +-----------+--------+-----+--------+----------+--------------+ CFA Distal 84                   triphasic                +-----------+--------+-----+--------+----------+--------------+ DFA        61                   triphasic                +-----------+--------+-----+--------+----------+--------------+ SFA Prox   80                   triphasic                +-----------+--------+-----+--------+----------+--------------+ SFA Mid    45                    triphasic                +-----------+--------+-----+--------+----------+--------------+ SFA Distal 45                   triphasic                +-----------+--------+-----+--------+----------+--------------+ POP Prox   46                   triphasic                +-----------+--------+-----+--------+----------+--------------+ POP Distal 149                  biphasic                 +-----------+--------+-----+--------+----------+--------------+ TP Trunk   85                                            +-----------+--------+-----+--------+----------+--------------+ ATA Distal 27                   monophasic               +-----------+--------+-----+--------+----------+--------------+ PTA Distal 18                                            +-----------+--------+-----+--------+----------+--------------+ PERO Prox                                 not visualized +-----------+--------+-----+--------+----------+--------------+ PERO Mid   90                   monophasic               +-----------+--------+-----+--------+----------+--------------+ PERO Distal107                  monophasic               +-----------+--------+-----+--------+----------+--------------+  Summary: Left: Proximal peroneal artery not visualized. Mid and distal peroneal appear patent.  See table(s) above for measurements  and observations. Electronically signed by Coral Else MD on 02/02/2023 at 7:31:30 PM.    Final     Assessment/Plan     Labs/tests ordered:  None  Next appt:  06/30/2023

## 2023-02-25 NOTE — Progress Notes (Signed)
[name], BEWLEY A ([id number]) [id number] Page 1 of 7 Visit Report for 01/21/2023 Arrival Information Details Patient Name: Date of Service: [name] [facility] A. 01/21/2023 1:15 PM Medical Record Number: [id number] Patient Account Number: [id number] Date of Birth/Sex: Treating RN: 1951/02/28 (72 y.o. Yates Decamp Primary Care Lorali Khamis: Richarda Blade Other Clinician: Referring Kemoni Quesenberry: Treating Mccormick Macon/Extender: Geralyn Corwin Ngetich, [name] in Treatment: 55 Visit Information History Since Last Visit All ordered tests and consults were completed: Yes Patient Arrived: Ambulatory Added or deleted any medications: No Arrival Time: 13:32 Any new allergies or adverse reactions: No Accompanied By: daughter Had a fall or experienced change in No Transfer Assistance: None activities of daily living that may affect Patient Identification Verified: Yes risk of falls: Secondary Verification Process Completed: Yes Signs or symptoms of abuse/neglect since last visito No Patient Requires Transmission-Based Precautions: No Hospitalized since last visit: No Patient Has Alerts: Yes Implantable device outside of the clinic excluding No Patient Alerts: Patient on Blood Thinner cellular tissue based products placed in the center since last visit: Pain Present Now: No Electronic Signature(s) Signed: 02/25/2023 1:55:51 PM By: Brenton Grills Entered By: Brenton Grills on 01/21/2023 13:35:05 -------------------------------------------------------------------------------- Clinic Level of Care Assessment Details Patient Name: Date of Service: [name] A. 01/21/2023 1:15 PM Medical Record Number: [id number] Patient Account Number: [id number] Date of Birth/Sex: Treating RN: [birth year] (72 y.o. [name], Lauren Primary Care Nyheim Seufert: Richarda Blade Other Clinician: Referring Aily Tzeng: Treating Kastin Cerda/Extender: Geralyn Corwin Ngetich,  [name] in Treatment: 27 Clinic Level of Care Assessment Items TOOL 4 Quantity Score X- 1 0 Use when only an EandM is performed on FOLLOW-UP visit ASSESSMENTS - Nursing Assessment / Reassessment X- 1 10 Reassessment of Co-morbidities (includes updates in patient status) X- 1 5 Reassessment of Adherence to Treatment Plan ASSESSMENTS - Wound and Skin A ssessment / Reassessment X - Simple Wound Assessment / Reassessment - one wound 1 5  - 0 Complex Wound Assessment / Reassessment - multiple wounds  - 0 Dermatologic / Skin Assessment (not related to wound area) ASSESSMENTS - Focused Assessment X- 1 5 Circumferential Edema Measurements - multi extremities  - 0 Nutritional Assessment / Counseling / Intervention  - 0 Lower Extremity Assessment (monofilament, tuning fork, pulses)  - 0 Peripheral Arterial Disease Assessment (using hand held doppler) ASSESSMENTS - Ostomy and/or Continence Assessment and Care  - 0 Incontinence Assessment and Management  - 0 Ostomy Care Assessment and Management (repouching, etc.) PROCESS - Coordination of Care X - Simple Patient / Family Education for ongoing care 1 15 ACY, ORSAK A ([id number]) [phone].pdf Page 2 of 7  - 0 Complex (extensive) Patient / Family Education for ongoing care X- 1 10 Staff obtains Chiropractor, Records, T Results / Process Orders est  - 0 Staff telephones HHA, Nursing Homes / Clarify orders / etc  - 0 Routine Transfer to another Facility (non-emergent condition)  - 0 Routine Hospital Admission (non-emergent condition)  - 0 New Admissions / Manufacturing engineer / Ordering NPWT Apligraf, etc. ,  - 0 Emergency Hospital Admission (emergent condition) X- 1 10 Simple Discharge Coordination  - 0 Complex (extensive) Discharge Coordination PROCESS - Special Needs  - 0 Pediatric / Minor Patient Management  - 0 Isolation Patient Management  -  0 Hearing / Language / Visual special needs  - 0 Assessment of Community assistance (transportation, D/C planning, etc.)  - 0 Additional assistance / Altered mentation  - 0 Support Surface(s) Assessment (bed, cushion, seat, etc.) INTERVENTIONS -  Wound Cleansing / Measurement X - Simple Wound Cleansing - one wound 1 5 []  - 0 Complex Wound Cleansing - multiple wounds X- 1 5 Wound Imaging (photographs - any number of wounds) []  - 0 Wound Tracing (instead of photographs) X- 1 5 Simple Wound Measurement - one wound []  - 0 Complex Wound Measurement - multiple wounds INTERVENTIONS - Wound Dressings X - Small Wound Dressing one or multiple wounds 1 10 []  - 0 Medium Wound Dressing one or multiple wounds []  - 0 Large Wound Dressing one or multiple wounds []  - 0 Application of Medications - topical []  - 0 Application of Medications - injection INTERVENTIONS - Miscellaneous []  - 0 External ear exam []  - 0 Specimen Collection (cultures, biopsies, blood, body fluids, etc.) []  - 0 Specimen(s) / Culture(s) sent or taken to Lab for analysis []  - 0 Patient Transfer (multiple staff / Nurse, adult / Similar devices) []  - 0 Simple Staple / Suture removal (25 or less) []  - 0 Complex Staple / Suture removal (26 or more) []  - 0 Hypo / Hyperglycemic Management (close monitor of Blood Glucose) []  - 0 Ankle / Brachial Index (ABI) - do not check if billed separately X- 1 5 Vital Signs Has the patient been seen at the hospital within the last three years: Yes Total Score: 90 Level Of Care: New/Established - Level 3 Electronic Signature(s) Signed: 01/22/2023 10:47:35 AM By: Fonnie Mu RN Entered By: Fonnie Mu on 01/21/2023 14:14:42 Trentham, Jomarie Longs A ([id number]) [id number] Page 3 of 7 -------------------------------------------------------------------------------- Encounter Discharge Information Details Patient Name: Date of Service: [name]  [name] [facility] A. 01/21/2023 1:15 PM Medical Record Number: [id number] Patient Account Number: [id number] Date of Birth/Sex: Treating RN: 26-Jun-1951 (72 y.o. [name] Primary Care Jasenia Weilbacher: Richarda Blade Other Clinician: Referring Leatrice Parilla: Treating Harshith Pursell/Extender: Geralyn Corwin Ngetich, [name] in Treatment: 20 Encounter Discharge Information Items Discharge Condition: Stable Ambulatory Status: Cane Discharge Destination: Home Transportation: Private Auto Accompanied By: daughter in Social worker and granddaughter Schedule Follow-up Appointment: Yes Clinical Summary of Care: Patient Declined Electronic Signature(s) Signed: 01/22/2023 10:47:35 AM By: Fonnie Mu RN Entered By: Fonnie Mu on 01/21/2023 14:16:13 -------------------------------------------------------------------------------- Lower Extremity Assessment Details Patient Name: Date of Service: [name] [facility] A. 01/21/2023 1:15 PM Medical Record Number: [id number] Patient Account Number: [id number] Date of Birth/Sex: Treating RN: 11/18/1950 (72 y.o. Yates Decamp Primary Care Chanetta Moosman: Richarda Blade Other Clinician: Referring Gabrelle Roca: Treating Safire Gordin/Extender: Geralyn Corwin Ngetich, [name] in Treatment: 27 Edema Assessment Assessed: [Left: No] [Right: No] Edema: [Left: Yes] [Right: Yes] Calf Left: Right: Point of Measurement: 39 cm From Medial Instep 40.5 cm 46 cm Ankle Left: Right: Point of Measurement: 8 cm From Medial Instep 26.5 cm 26 cm Vascular Assessment Pulses: Dorsalis Pedis Palpable: [Left:Yes] [Right:Yes] Electronic Signature(s) Signed: 02/25/2023 1:55:51 PM By: Brenton Grills Entered By: Brenton Grills on 01/21/2023 13:50:36 -------------------------------------------------------------------------------- Multi Wound Chart Details Patient Name: Date of Service: [name] [facility] A. 01/21/2023 1:15 PM Medical Record Number: [id number] Patient Account  Number: [id number] Date of Birth/Sex: Treating RN: 1951-10-31 (72 y.o. M) Primary Care Sujay Grundman: Richarda Blade Other Clinician: Referring Milarose Savich: Treating Lelani Garnett/Extender: Velna Ochs, [name] in Treatment: [address] [place] A ([id number]) [id number] Page 4 of 7 Vital Signs Height(in): 68 Pulse(bpm): 125 Weight(lbs): 185 Blood Pressure(mmHg): 118/76 Body Mass Index(BMI): 28.1 Temperature(F): 96.6 Respiratory Rate(breaths/min): 18 [1:Photos:] [N/A:N/A] Right Achilles N/A N/A Wound Location: Surgical Injury N/A N/A Wounding Event: Diabetic Wound/Ulcer of the Lower N/A N/A Primary Etiology:  Extremity Sleep Apnea, Arrhythmia, Congestive N/A N/A Comorbid History: Heart Failure, Hypertension, Peripheral Arterial Disease, Type II Diabetes 07/16/2022 N/A N/A Date Acquired: 73 N/A N/A Weeks of Treatment: Healed - Epithelialized N/A N/A Wound Status: No N/A N/A Wound Recurrence: 0x0x0 N/A N/A Measurements L x W x D (cm) 0 N/A N/A A (cm) : rea 0 N/A N/A Volume (cm) : 100.00% N/A N/A % Reduction in A rea: 100.00% N/A N/A % Reduction in Volume: Grade 2 N/A N/A Classification: None Present N/A N/A Exudate A mount: Distinct, outline attached N/A N/A Wound Margin: Small (1-33%) N/A N/A Granulation A mount: Red N/A N/A Granulation Quality: Large (67-100%) N/A N/A Necrotic A mount: Eschar N/A N/A Necrotic Tissue: Fat Layer (Subcutaneous Tissue): Yes N/A N/A Exposed Structures: Fascia: No Tendon: No Muscle: No Joint: No Bone: No Large (67-100%) N/A N/A Epithelialization: Excoriation: No N/A N/A Periwound Skin Texture: Induration: No Callus: No Crepitus: No Rash: No Scarring: No Dry/Scaly: Yes N/A N/A Periwound Skin Moisture: Maceration: No Atrophie Blanche: No N/A N/A Periwound Skin Color: Cyanosis: No Ecchymosis: No Erythema: No Hemosiderin Staining: No Mottled: No Pallor: No Rubor: No Treatment  Notes Wound #1 (Achilles) Wound Laterality: Right Cleanser Peri-Wound Care Topical Primary Dressing Secondary Dressing Secured With Compression YASIR, KITNER A ([id number]) [id number] Page 5 of 7 Compression Stockings Add-Ons Electronic Signature(s) Signed: 01/21/2023 3:34:03 PM By: Geralyn Corwin DO Entered By: Geralyn Corwin on 01/21/2023 14:55:12 -------------------------------------------------------------------------------- Multi-Disciplinary Care Plan Details Patient Name: Date of Service: [name] [facility] A. 01/21/2023 1:15 PM Medical Record Number: [id number] Patient Account Number: [id number] Date of Birth/Sex: Treating RN: August 05, 1951 (72 y.o. [name] Primary Care Montrel Donahoe: Richarda Blade Other Clinician: Referring Taevin Mcferran: Treating Sherill Wegener/Extender: Velna Ochs, [name] in Treatment: 102 Active Inactive Electronic Signature(s) Signed: 01/22/2023 4:02:25 PM By: Shawn Stall RN, BSN Signed: 02/10/2023 4:26:45 PM By: Fonnie Mu RN Previous Signature: 01/22/2023 10:47:35 AM Version By: Fonnie Mu RN Entered By: Shawn Stall on 01/22/2023 16:02:25 -------------------------------------------------------------------------------- Pain Assessment Details Patient Name: Date of Service: [name] [facility] A. 01/21/2023 1:15 PM Medical Record Number: [id number] Patient Account Number: [id number] Date of Birth/Sex: Treating RN: [birth year] (72 y.o. Yates Decamp Primary Care Merrianne Mccumbers: Richarda Blade Other Clinician: Referring [name]: Treating Chiquetta Langner/Extender: Geralyn Corwin Ngetich, [name] in Treatment: 27 Active Problems Location of Pain Severity and Description of Pain Patient Has Paino No Site Locations Pain Management and Medication Current Pain Management: Electronic Signature(s) Signed: 02/25/2023 1:55:51 PM By: Charlsie Quest, Jomarie Longs A ([id number])  [id number] Page 6 of 7 Entered By: Brenton Grills on 01/21/2023 13:40:23 -------------------------------------------------------------------------------- Patient/Caregiver Education Details Patient Name: Date of Service: [name] [facility] A. 3/14/2024andnbsp1:15 PM Medical Record Number: [id number] Patient Account Number: [id number] Date of Birth/Gender: Treating RN: 26-Jul-1951 (72 y.o. [name] Primary Care Physician: Richarda Blade Other Clinician: Referring Physician: Treating Physician/Extender: Jae Dire in Treatment: 58 Education Assessment Education Provided To: Patient Education Topics Provided Wound/Skin Impairment: Methods: Explain/Verbal Responses: Reinforcements needed, State content correctly [employer]) Signed: 01/22/2023 10:47:35 AM By: Fonnie Mu RN Entered By: Fonnie Mu on 01/21/2023 14:14:13 -------------------------------------------------------------------------------- Wound Assessment Details Patient Name: Date of Service: [name] [facility] A. 01/21/2023 1:15 PM Medical Record Number: [id number] Patient Account Number: [id number] Date of Birth/Sex: Treating RN: 02-Aug-1951 (72 y.o. [name] Primary Care Kaityln Kallstrom: Richarda Blade Other Clinician: Referring Emerlyn Mehlhoff: Treating Alucard Fearnow/Extender: Geralyn Corwin Ngetich, [name] in Treatment: 27 Wound Status Wound Number: 1 Primary Diabetic Wound/Ulcer  of the Lower Extremity Etiology: Wound Location: Right Achilles Wound Healed - Epithelialized Wounding Event: Surgical Injury Status: Date Acquired: 07/16/2022 Comorbid Sleep Apnea, Arrhythmia, Congestive Heart Failure, Hypertension, Weeks Of Treatment: 27 History: Peripheral Arterial Disease, Type II Diabetes Clustered Wound: No Photos Wound Measurements Length: (cm) Width: (cm) Depth: (cm) Area: (cm) Volume: (cm) 0 % Reduction in Area:  100% 0 % Reduction in Volume: 100% 0 Epithelialization: Large (67-100%) 0 0 Wound Description Classification: Grade 2 Nishikawa, Malaquias A ([id number]) Wound Margin: Distinct, outline attached Exudate Amount: None Present Foul Odor After Cleansing: No [phone].pdf Page 7 of 7 Slough/Fibrino No Wound Bed Granulation Amount: Small (1-33%) Exposed Structure Granulation Quality: Red Fascia Exposed: No Necrotic Amount: Large (67-100%) Fat Layer (Subcutaneous Tissue) Exposed: Yes Necrotic Quality: Eschar Tendon Exposed: No Muscle Exposed: No Joint Exposed: No Bone Exposed: No Periwound Skin Texture Texture Color No Abnormalities Noted: No No Abnormalities Noted: No Callus: No Atrophie Blanche: No Crepitus: No Cyanosis: No Excoriation: No Ecchymosis: No Induration: No Erythema: No Rash: No Hemosiderin Staining: No Scarring: No Mottled: No Pallor: No Moisture Rubor: No No Abnormalities Noted: No Dry / Scaly: Yes Maceration: No Treatment Notes Wound #1 (Achilles) Wound Laterality: Right Cleanser Peri-Wound Care Topical Primary Dressing Secondary Dressing Secured With Compression Wrap Compression Stockings Add-Ons Electronic Signature(s) Signed: 01/22/2023 10:47:35 AM By: Fonnie Mu RN Entered By: Fonnie Mu on 01/21/2023 14:08:33 -------------------------------------------------------------------------------- Vitals Details Patient Name: Date of Service: [name] [name] 01/21/2023 1:15 PM Medical Record Number: [id number] Patient Account Number: [id number] Date of Birth/Sex: Treating RN: 28-Feb-1951 (72 y.o. Yates Decamp Primary Care Porchia Sinkler: Richarda Blade Other Clinician: Referring Tamiah Dysart: Treating Alfonso Shackett/Extender: Geralyn Corwin Ngetich, [name] in Treatment: 27 Vital Signs Time Taken: 13:35 Temperature (F): 96.6 Height (in): 68 Pulse (bpm): 125 Weight (lbs): 185 Respiratory Rate  (breaths/min): 18 Body Mass Index (BMI): 28.1 Blood Pressure (mmHg): 118/76 Reference Range: 80 - 120 mg / dl Electronic Signature(s) Signed: 02/25/2023 1:55:51 PM By: Brenton Grills Entered By: Brenton Grills on 01/21/2023 13:40:10

## 2023-02-26 NOTE — Progress Notes (Signed)
GRAYCEN, SADLON A ([id number]) [id number] Page 1 of 8 Visit Report for 02/23/2023 Arrival Information Details Patient Name: Date of Service: [name] [facility] A. 02/23/2023 1:15 PM Medical Record Number: [id number] Patient Account Number: [id number] Date of Birth/Sex: Treating RN: [birth year] (72 y.o. M) Primary Care Montravious Weigelt: Richarda Blade Other Clinician: Referring Layah Skousen: Treating Bradon Fester/Extender: Geralyn Corwin Ngetich, [name] in Treatment: 31 Visit Information History Since Last Visit Added or deleted any medications: No Patient Arrived: Cane Any new allergies or adverse reactions: No Arrival Time: 13:23 Had a fall or experienced change in No Accompanied By: daughter activities of daily living that may affect Transfer Assistance: None risk of falls: Patient Identification Verified: Yes Signs or symptoms of abuse/neglect since last visito No Secondary Verification Process Completed: Yes Hospitalized since last visit: No Patient Requires Transmission-Based Precautions: No Implantable device outside of the clinic excluding No Patient Has Alerts: Yes cellular tissue based products placed in the center Patient Alerts: Patient on Blood Thinner since last visit: Pain Present Now: No Electronic Signature(s) Signed: 02/26/2023 11:38:03 AM By: Thayer Dallas Entered By: Thayer Dallas on 02/23/2023 13:25:32 -------------------------------------------------------------------------------- Clinic Level of Care Assessment Details Patient Name: Date of Service: [name] [facility] A. 02/23/2023 1:15 PM Medical Record Number: [id number] Patient Account Number: [id number] Date of Birth/Sex: Treating RN: 1951-03-28 (72 y.o. [name], Lauren Primary Care Jazel Nimmons: Richarda Blade Other Clinician: Referring Casen Pryor: Treating Renessa Wellnitz/Extender: Geralyn Corwin Ngetich, [name] in Treatment: 31 Clinic Level of Care Assessment Items TOOL 4  Quantity Score X- 1 0 Use when only an EandM is performed on FOLLOW-UP visit ASSESSMENTS - Nursing Assessment / Reassessment X- 1 10 Reassessment of Co-morbidities (includes updates in patient status) X- 1 5 Reassessment of Adherence to Treatment Plan ASSESSMENTS - Wound and Skin A ssessment / Reassessment X - Simple Wound Assessment / Reassessment - one wound 1 5  - 0 Complex Wound Assessment / Reassessment - multiple wounds  - 0 Dermatologic / Skin Assessment (not related to wound area) ASSESSMENTS - Focused Assessment X- 1 5 Circumferential Edema Measurements - multi extremities  - 0 Nutritional Assessment / Counseling / Intervention  - 0 Lower Extremity Assessment (monofilament, tuning fork, pulses)  - 0 Peripheral Arterial Disease Assessment (using hand held doppler) ASSESSMENTS - Ostomy and/or Continence Assessment and Care  - 0 Incontinence Assessment and Management  - 0 Ostomy Care Assessment and Management (repouching, etc.) PROCESS - Coordination of Care X - Simple Patient / Family Education for ongoing care 1 [address] NICO, SYME A ([id number]) [phone].pdf Page 2 of 8  - 0 Complex (extensive) Patient / Family Education for ongoing care X- 1 10 Staff obtains Chiropractor, Records, T Results / Process Orders est  - 0 Staff telephones HHA, Nursing Homes / Clarify orders / etc  - 0 Routine Transfer to another Facility (non-emergent condition)  - 0 Routine Hospital Admission (non-emergent condition)  - 0 New Admissions / Manufacturing engineer / Ordering NPWT Apligraf, etc. ,  - 0 Emergency Hospital Admission (emergent condition) X- 1 10 Simple Discharge Coordination  - 0 Complex (extensive) Discharge Coordination PROCESS - Special Needs  - 0 Pediatric / Minor Patient Management  - 0 Isolation Patient Management  - 0 Hearing / Language / Visual special needs  - 0 Assessment of Community  assistance (transportation, D/C planning, etc.)  - 0 Additional assistance / Altered mentation  - 0 Support Surface(s) Assessment (bed, cushion, seat, etc.) INTERVENTIONS - Wound Cleansing / Measurement X - Simple Wound Cleansing -  one wound 1 5 []  - 0 Complex Wound Cleansing - multiple wounds X- 1 5 Wound Imaging (photographs - any number of wounds) []  - 0 Wound Tracing (instead of photographs) X- 1 5 Simple Wound Measurement - one wound []  - 0 Complex Wound Measurement - multiple wounds INTERVENTIONS - Wound Dressings X - Small Wound Dressing one or multiple wounds 1 10 []  - 0 Medium Wound Dressing one or multiple wounds []  - 0 Large Wound Dressing one or multiple wounds []  - 0 Application of Medications - topical []  - 0 Application of Medications - injection INTERVENTIONS - Miscellaneous []  - 0 External ear exam []  - 0 Specimen Collection (cultures, biopsies, blood, body fluids, etc.) []  - 0 Specimen(s) / Culture(s) sent or taken to Lab for analysis []  - 0 Patient Transfer (multiple staff / Nurse, adult / Similar devices) []  - 0 Simple Staple / Suture removal (25 or less) []  - 0 Complex Staple / Suture removal (26 or more) []  - 0 Hypo / Hyperglycemic Management (close monitor of Blood Glucose) []  - 0 Ankle / Brachial Index (ABI) - do not check if billed separately X- 1 5 Vital Signs Has the patient been seen at the hospital within the last three years: Yes Total Score: 90 Level Of Care: New/Established - Level 3 Electronic Signature(s) Signed: 02/26/2023 11:19:08 AM By: Fonnie Mu RN Entered By: Fonnie Mu on 02/23/2023 13:44:09 Bones, Jomarie Longs A ([id number]) [id number] Page 3 of 8 -------------------------------------------------------------------------------- Encounter Discharge Information Details Patient Name: Date of Service: [name] [facility] A. 02/23/2023 1:15 PM Medical Record Number: [id number] Patient  Account Number: [id number] Date of Birth/Sex: Treating RN: Apr 06, 1951 (72 y.o. [name] Primary Care Shadawn Hanaway: Richarda Blade Other Clinician: Referring Manas Hickling: Treating Lewis Grivas/Extender: Geralyn Corwin Ngetich, [name] in Treatment: 56 Encounter Discharge Information Items Discharge Condition: Stable Ambulatory Status: Cane Discharge Destination: Home Transportation: Private Auto Accompanied By: daughter Schedule Follow-up Appointment: Yes Clinical Summary of Care: Patient Declined Electronic Signature(s) Signed: 02/26/2023 11:19:08 AM By: Fonnie Mu RN Entered By: Fonnie Mu on 02/23/2023 13:44:48 -------------------------------------------------------------------------------- Lower Extremity Assessment Details Patient Name: Date of Service: [name] [facility] A. 02/23/2023 1:15 PM Medical Record Number: [id number] Patient Account Number: [id number] Date of Birth/Sex: Treating RN: 27-Jul-1951 (72 y.o. [name] Primary Care Samoria Fedorko: Richarda Blade Other Clinician: Referring Carigan Lister: Treating Navil Kole/Extender: Geralyn Corwin Ngetich, [name] in Treatment: 31 Edema Assessment Assessed: [Left: No] [Right: Yes] Edema: [Left: Yes] [Right: Yes] Calf Left: Right: Point of Measurement: 39 cm From Medial Instep 40.5 cm 46 cm Ankle Left: Right: Point of Measurement: 8 cm From Medial Instep 26.5 cm 26 cm Vascular Assessment Pulses: Dorsalis Pedis Palpable: [Right:Yes] Posterior Tibial Palpable: [Right:Yes] Electronic Signature(s) Signed: 02/26/2023 11:19:08 AM By: Fonnie Mu RN Entered By: Fonnie Mu on 02/23/2023 13:31:10 -------------------------------------------------------------------------------- Multi Wound Chart Details Patient Name: Date of Service: [name] [name] 02/23/2023 1:15 PM Medical Record Number: [id number] Patient Account Number: [id number] Date of Birth/Sex: Treating RN: 1951-07-16  (72 y.o. M) Primary Care Twylia Oka: Richarda Blade Other Clinician: Referring Suhaas Agena: Treating Nikaela Coyne/Extender: Santino, Kinsella A ([id number]) [id number] Page 4 of 8 Weeks in Treatment: 31 Vital Signs Height(in): 68 Pulse(bpm): 121 Weight(lbs): 185 Blood Pressure(mmHg): 157/90 Body Mass Index(BMI): 28.1 Temperature(F): 98.1 Respiratory Rate(breaths/min): 20 [4:Photos: No Photos Right, Posterior Achilles Wound Location: Gradually Appeared Wounding Event: Diabetic Wound/Ulcer of the Lower Primary Etiology: Extremity Sleep Apnea, Arrhythmia, Congestive N/A Comorbid History: Heart Failure, Hypertension, Peripheral  Arterial  Disease, Type II Diabetes 02/23/2023 Date Acquired: 0 Weeks of Treatment: Open Wound Status: No Wound Recurrence: 3x2x0.1 Measurements L x W x D (cm) 4.712 A (cm) : rea 0.471 Volume (cm) : Grade 1 Classification: Medium Exudate A mount:  Serosanguineous Exudate Type: red, brown Exudate Color: Distinct, outline attached Wound Margin: Large (67-100%) Granulation A mount: Red, Pink Granulation Quality: None Present (0%) Necrotic A mount: Fascia: No Exposed Structures: Fat Layer  (Subcutaneous Tissue): No Tendon: No Muscle: No Joint: No Bone: No Small (1-33%) Epithelialization: Excoriation: No Periwound Skin Texture: Induration: No Callus: No Crepitus: No Rash: No Scarring: No Maceration: No Periwound Skin Moisture: Dry/Scaly: No  Atrophie Blanche: No Periwound Skin Color: Cyanosis: No Ecchymosis: No Erythema: No Hemosiderin Staining: No Mottled: No Pallor: No Rubor: No No Abnormality Temperature: Yes Tenderness on Palpation:] [N/A:N/A N/A N/A N/A N/A N/A N/A N/A N/A N/A N/A N/A  N/A N/A N/A N/A N/A N/A N/A N/A N/A N/A N/A N/A N/A N/A] Treatment Notes Wound #4 (Achilles) Wound Laterality: Right, Posterior Cleanser Wound Cleanser Discharge Instruction: Cleanse the wound with wound cleanser prior to applying a clean  dressing using gauze sponges, not tissue or cotton balls. Peri-Wound Care Skin Prep Discharge Instruction: Use skin prep as directed Topical Primary Dressing Maxorb Extra Ag+ Alginate Dressing, 4x4.75 (in/in) Discharge Instruction: Apply to wound bed as instructed Secondary Dressing Woven Gauze Sponge, Non-Sterile 4x4 in Discharge Instruction: Apply over primary dressing as directed. ARGUS, CARAHER A ([id number]) [id number] Page 5 of 8 Zetuvit Plus Silicone Border Dressing 4x4 (in/in) Discharge Instruction: Apply silicone border over primary dressing as directed. Secured With Compression Wrap Compression Stockings Add-Ons Electronic Signature(s) Signed: 02/23/2023 2:11:27 PM By: Geralyn Corwin DO Entered By: Geralyn Corwin on 02/23/2023 13:54:52 -------------------------------------------------------------------------------- Multi-Disciplinary Care Plan Details Patient Name: Date of Service: [name] [name] [facility] A. 02/23/2023 1:15 PM Medical Record Number: [id number] Patient Account Number: [id number] Date of Birth/Sex: Treating RN: 28-Mar-1951 (72 y.o. [name], Lauren Primary Care Williom Cedar: Richarda Blade Other Clinician: Referring Lorah Kalina: Treating Khara Renaud/Extender: Geralyn Corwin Ngetich, [name] in Treatment: 68 Active Inactive Orientation to the Wound Care Program Nursing Diagnoses: Knowledge deficit related to the wound healing center program Goals: Patient/caregiver will verbalize understanding of the Wound Healing Center Program Date Initiated: 07/16/2022 Target Resolution Date: 04/09/2023 Goal Status: Active Interventions: Provide education on orientation to the wound center Notes: Wound/Skin Impairment Nursing Diagnoses: Knowledge deficit related to ulceration/compromised skin integrity Goals: Patient/caregiver will verbalize understanding of skin care regimen Date Initiated: 07/16/2022 Target Resolution Date:  04/09/2023 Goal Status: Active Interventions: Assess patient/caregiver ability to perform ulcer/skin care regimen upon admission and as needed Assess ulceration(s) every visit Provide education on ulcer and skin care Treatment Activities: Skin care regimen initiated : 07/16/2022 Topical wound management initiated : 07/16/2022 Notes: Electronic Signature(s) Signed: 02/26/2023 11:19:08 AM By: Fonnie Mu RN Entered By: Fonnie Mu on 02/23/2023 13:43:02 [place], Jomarie Longs A ([id number]) [id number] Page 6 of 8 -------------------------------------------------------------------------------- Pain Assessment Details Patient Name: Date of Service: Val Eagle [name] [facility] A. 02/23/2023 1:15 PM Medical Record Number: [id number] Patient Account Number: [id number] Date of Birth/Sex: Treating RN: [birth year] (72 y.o. M) Primary Care Serrena Linderman: Richarda Blade Other Clinician: Referring Donelle Baba: Treating Blondine Hottel/Extender: Geralyn Corwin Ngetich, [name] in Treatment: 31 Active Problems Location of Pain Severity and Description of Pain Patient Has Paino No Site Locations Pain Management and Medication Current Pain Management: Electronic Signature(s) Signed: 02/26/2023 11:38:03 AM By: Thayer Dallas Entered By: Thayer Dallas on 02/23/2023 13:26:17 -------------------------------------------------------------------------------- Patient/Caregiver Education Details Patient  Name: Date of Service: [name] [facility] A. 4/16/2024andnbsp1:15 PM Medical Record Number: [id number] Patient Account Number: [id number] Date of Birth/Gender: Treating RN: 1951/07/11 (72 y.o. [name] Primary Care Physician: Richarda Blade Other Clinician: Referring Physician: Treating Physician/Extender: Jae Dire in Treatment: 22 Education Assessment Education Provided To: Patient Education Topics Provided Wound/Skin Impairment: Methods:  Explain/Verbal Responses: Reinforcements needed, State content correctly Electronic Signature(s) Signed: 02/26/2023 11:19:08 AM By: Fonnie Mu RN Entered By: Fonnie Mu on 02/23/2023 13:43:27 Wound Assessment Details -------------------------------------------------------------------------------- [name] ([id number]) [id number] Page 7 of 8 Patient Name: Date of Service: [name] [facility] A. 02/23/2023 1:15 PM Medical Record Number: [id number] Patient Account Number: [id number] Date of Birth/Sex: Treating RN: 09-22-1951 (72 y.o. [name], Lauren Primary Care Brenley Priore: Richarda Blade Other Clinician: Referring Deryl Ports: Treating Joie Reamer/Extender: Geralyn Corwin Ngetich, [name] in Treatment: 31 Wound Status Wound Number: 4 Primary Diabetic Wound/Ulcer of the Lower Extremity Etiology: Wound Location: Right, Posterior Achilles Wound Open Wounding Event: Gradually Appeared Status: Date Acquired: 02/23/2023 Comorbid Sleep Apnea, Arrhythmia, Congestive Heart Failure, Hypertension, Weeks Of Treatment: 0 History: Peripheral Arterial Disease, Type II Diabetes Clustered Wound: No Wound Measurements Length: (cm) 3 % Reduction in Area: Width: (cm) 2 % Reduction in Volume: Depth: (cm) 0.1 Epithelialization: Small (1-33%) Area: (cm) 4.712 Tunneling: No Volume: (cm) 0.471 Undermining: No Wound Description Classification: Grade 1 Wound Margin: Distinct, outline attached Exudate Amount: Medium Exudate Type: Serosanguineous Exudate Color: red, brown Wound Bed Granulation Amount: Large (67-100%) Exposed Structure Granulation Quality: Red, Pink Fascia Exposed: No Necrotic Amount: None Present (0%) Fat Layer (Subcutaneous Tissue) Exposed: No Tendon Exposed: No Muscle Exposed: No Joint Exposed: No Bone Exposed: No Periwound Skin Texture Texture Color No Abnormalities Noted: No No Abnormalities Noted: No Callus:  No Atrophie Blanche: No Crepitus: No Cyanosis: No Excoriation: No Ecchymosis: No Induration: No Erythema: No Rash: No Hemosiderin Staining: No Scarring: No Mottled: No Pallor: No Moisture Rubor: No No Abnormalities Noted: No Dry / Scaly: No Temperature / Pain Maceration: No Temperature: No Abnormality Tenderness on Palpation: Yes Treatment Notes Wound #4 (Achilles) Wound Laterality: Right, Posterior Cleanser Wound Cleanser Discharge Instruction: Cleanse the wound with wound cleanser prior to applying a clean dressing using gauze sponges, not tissue or cotton balls. Peri-Wound Care Skin Prep Discharge Instruction: Use skin prep as directed Topical Primary Dressing Maxorb Extra Ag+ Alginate Dressing, 4x4.75 (in/in) Discharge Instruction: Apply to wound bed as instructed Secondary Dressing Woven Gauze Sponge, Non-Sterile 4x4 in BURLON, CENTRELLA A ([id number]) [id number].pdf Page 8 of 8 Discharge Instruction: Apply over primary dressing as directed. Zetuvit Plus Silicone Border Dressing 4x4 (in/in) Discharge Instruction: Apply silicone border over primary dressing as directed. Secured With Compression Wrap Compression Stockings Facilities manager) Signed: 02/26/2023 11:19:08 AM By: Fonnie Mu RN Entered By: Fonnie Mu on 02/23/2023 13:32:07 -------------------------------------------------------------------------------- Vitals Details Patient Name: Date of Service: [name] [name] 02/23/2023 1:15 PM Medical Record Number: [id number] Patient Account Number: [id number] Date of Birth/Sex: Treating RN: 1951-01-30 (72 y.o. M) Primary Care Shaddai Shapley: Richarda Blade Other Clinician: Referring Fauna Neuner: Treating Hasset Chaviano/Extender: Geralyn Corwin Ngetich, [name] in Treatment: 31 Vital Signs Time Taken: 13:25 Temperature (F): 98.1 Height (in): 68 Pulse (bpm): 121 Weight (lbs): 185 Respiratory Rate  (breaths/min): 20 Body Mass Index (BMI): 28.1 Blood Pressure (mmHg): 157/90 Reference Range: 80 - 120 mg / dl Electronic Signature(s) Signed: 02/26/2023 11:38:03 AM By: Thayer Dallas Entered By: Thayer Dallas on 02/23/2023 13:26:01

## 2023-03-01 ENCOUNTER — Ambulatory Visit (HOSPITAL_COMMUNITY): Admission: RE | Admit: 2023-03-01 | Payer: Medicare HMO | Source: Ambulatory Visit

## 2023-03-02 ENCOUNTER — Ambulatory Visit: Payer: Medicare HMO

## 2023-03-09 ENCOUNTER — Ambulatory Visit (HOSPITAL_BASED_OUTPATIENT_CLINIC_OR_DEPARTMENT_OTHER): Payer: Medicare HMO | Admitting: Internal Medicine

## 2023-03-10 ENCOUNTER — Ambulatory Visit: Payer: Medicare HMO

## 2023-03-15 ENCOUNTER — Encounter (HOSPITAL_BASED_OUTPATIENT_CLINIC_OR_DEPARTMENT_OTHER): Payer: Medicare HMO | Attending: Internal Medicine | Admitting: Internal Medicine

## 2023-03-15 DIAGNOSIS — I110 Hypertensive heart disease with heart failure: Secondary | ICD-10-CM | POA: Diagnosis not present

## 2023-03-15 DIAGNOSIS — I5022 Chronic systolic (congestive) heart failure: Secondary | ICD-10-CM | POA: Insufficient documentation

## 2023-03-15 DIAGNOSIS — F17200 Nicotine dependence, unspecified, uncomplicated: Secondary | ICD-10-CM | POA: Insufficient documentation

## 2023-03-15 DIAGNOSIS — E1140 Type 2 diabetes mellitus with diabetic neuropathy, unspecified: Secondary | ICD-10-CM | POA: Insufficient documentation

## 2023-03-15 DIAGNOSIS — Z833 Family history of diabetes mellitus: Secondary | ICD-10-CM | POA: Insufficient documentation

## 2023-03-15 DIAGNOSIS — M86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, left ankle and foot: Secondary | ICD-10-CM | POA: Insufficient documentation

## 2023-03-15 DIAGNOSIS — E1151 Type 2 diabetes mellitus with diabetic peripheral angiopathy without gangrene: Secondary | ICD-10-CM | POA: Insufficient documentation

## 2023-03-15 DIAGNOSIS — Z794 Long term (current) use of insulin: Secondary | ICD-10-CM | POA: Diagnosis not present

## 2023-03-15 DIAGNOSIS — Z8249 Family history of ischemic heart disease and other diseases of the circulatory system: Secondary | ICD-10-CM | POA: Diagnosis not present

## 2023-03-15 DIAGNOSIS — E11621 Type 2 diabetes mellitus with foot ulcer: Secondary | ICD-10-CM | POA: Insufficient documentation

## 2023-03-15 DIAGNOSIS — E1169 Type 2 diabetes mellitus with other specified complication: Secondary | ICD-10-CM | POA: Insufficient documentation

## 2023-03-15 DIAGNOSIS — L97518 Non-pressure chronic ulcer of other part of right foot with other specified severity: Secondary | ICD-10-CM | POA: Diagnosis not present

## 2023-03-15 DIAGNOSIS — L97318 Non-pressure chronic ulcer of right ankle with other specified severity: Secondary | ICD-10-CM | POA: Diagnosis not present

## 2023-03-16 ENCOUNTER — Ambulatory Visit: Payer: Medicare HMO | Attending: Cardiovascular Disease | Admitting: *Deleted

## 2023-03-16 DIAGNOSIS — I4891 Unspecified atrial fibrillation: Secondary | ICD-10-CM

## 2023-03-16 DIAGNOSIS — Z5181 Encounter for therapeutic drug level monitoring: Secondary | ICD-10-CM | POA: Diagnosis not present

## 2023-03-16 LAB — POCT INR: POC INR: 4.5

## 2023-03-16 NOTE — Patient Instructions (Signed)
Description   Hold warfarin 5/8 and 5/9 then Continue taking warfarin 1/2 tablet daily except for 1 tablet on Mondays, Wednesdays, and Fridays. Recheck INR 1 week.  Coumadin Clinic [phone].

## 2023-03-22 ENCOUNTER — Ambulatory Visit (HOSPITAL_COMMUNITY): Admission: RE | Admit: 2023-03-22 | Payer: Medicare HMO | Source: Ambulatory Visit

## 2023-03-22 NOTE — Progress Notes (Unsigned)
HISTORY AND PHYSICAL     CC:  follow up. Requesting Provider:  Caesar Bookman, NP  HPI: This is a 72 y.o. male who is here today for follow up for PAD.  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  He had been having pain and blistering for about 7 weeks that had progressed.  It was frankly necrotic with maggot infestation.  He did not have any claudication or rest pain.     On May 19, 2021, he underwent left peroneal angioplasty via right CFA by Dr. Chestine Spore.  On May 22, 2021, he underwent left 5th toe amputation also by Dr. Chestine Spore.  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 Chestine Spore.  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 Chestine Spore.    Pt was last seen 02/02/2023 and at that time, he felt his wounds were doing well and was not having any pain in his legs.  He did still have tighness in the right calf from an achilles tendon tear.  He was advised on wearing compression. He did have a drop in his ABI on the right and was scheduled for 6-8 week follow up with repeat ABI.  The pt returns today for follow up.  ***  The pt is not on a statin for cholesterol management.    The pt is not on an aspirin.    Other AC:  Plavix/coumadin The pt is on diuretic, BB for hypertension.  The pt is on medication for diabetes. Tobacco hx:  ***  Pt does *** have family hx of AAA.  Past Medical History:  Diagnosis Date   A-fib [facility])    a. (06/04/14) TEE-DC-CV; succesful; large LA 6.2 cm   ADHD    Ascites    Athlete's foot    Chronic systolic heart failure (HCC)    a. 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, unspecified CKD stage, unspecified whether long term insulin use (HCC)    Heart murmur    HTN (hypertension)    OSA (obstructive sleep apnea)    PVD (peripheral vascular disease) (HCC)    s/p great toe amputation    Past Surgical History:  Procedure Laterality Date   ABDOMINAL AORTOGRAM W/LOWER EXTREMITY N/A 05/19/2021   Procedure: ABDOMINAL AORTOGRAM W/LOWER EXTREMITY;  Surgeon: Cephus Shelling, MD;  Location: [facility];  Service: Cardiovascular;  Laterality: N/A;   ABDOMINAL AORTOGRAM W/LOWER EXTREMITY Right 06/11/2022   Procedure: ABDOMINAL AORTOGRAM W/LOWER EXTREMITY;  Surgeon: Cephus Shelling, MD;  Location: [facility];  Service: Cardiovascular;  Laterality: Right;   ABDOMINAL AORTOGRAM W/LOWER EXTREMITY Left 12/10/2022   Procedure: ABDOMINAL AORTOGRAM W/LOWER EXTREMITY;  Surgeon: Cephus Shelling, MD;  Location: [facility];  Service: Cardiovascular;  Laterality: Left;   AMPUTATION Left 05/22/2021   Procedure: LEFT FIFTH TOE AMPUTATION;  Surgeon: Cephus Shelling, MD;  Location: [facility] OR;  Service: Vascular;  Laterality: Left;   CARDIOVERSION N/A 06/04/2014   Procedure: CARDIOVERSION;  Surgeon: Laurey Morale, MD;  Location: [facility]  ENDOSCOPY;  Service: Cardiovascular;  Laterality: N/A;   CARDIOVERSION N/A 11/06/2021   Procedure: CARDIOVERSION;  Surgeon: Laurey Morale, MD;  Location: [facility] ENDOSCOPY;  Service: Cardiovascular;  Laterality: N/A;   NOSE SURGERY     Nasal septum surgery   PERIPHERAL VASCULAR BALLOON ANGIOPLASTY  06/11/2022   Procedure: PERIPHERAL VASCULAR BALLOON ANGIOPLASTY;  Surgeon: Cephus Shelling, MD;  Location: [facility];  Service: Cardiovascular;;   PERIPHERAL VASCULAR BALLOON ANGIOPLASTY Left 12/10/2022   Procedure: PERIPHERAL VASCULAR BALLOON ANGIOPLASTY;  Surgeon: Cephus Shelling, MD;  Location: [facility];  Service: Cardiovascular;  Laterality: Left;  Peroneal    PERIPHERAL VASCULAR INTERVENTION Left 05/19/2021   Procedure: PERIPHERAL VASCULAR INTERVENTION;  Surgeon: Cephus Shelling, MD;  Location: [facility];  Service: Cardiovascular;  Laterality: Left;   TEE WITHOUT CARDIOVERSION N/A 06/04/2014   Procedure: TRANSESOPHAGEAL ECHOCARDIOGRAM (TEE);  Surgeon: Laurey Morale, MD;  Location: [facility] ENDOSCOPY;  Service: Cardiovascular;  Laterality: N/A;   TEE WITHOUT CARDIOVERSION N/A 01/06/2016   Procedure: TRANSESOPHAGEAL ECHOCARDIOGRAM (TEE);  Surgeon: Pricilla Riffle, MD;  Location: [facility] ENDOSCOPY;  Service: Cardiovascular;  Laterality: N/A;   TEE WITHOUT CARDIOVERSION N/A 11/06/2021   Procedure: TRANSESOPHAGEAL ECHOCARDIOGRAM (TEE);  Surgeon: Laurey Morale, MD;  Location: [facility] ENDOSCOPY;  Service: Cardiovascular;  Laterality: N/A;   VASECTOMY      No Known Allergies  Current Outpatient Medications  Medication Sig Dispense Refill   APPLE CIDER VINEGAR PO Take 30 mLs by mouth 4 (four) times a week.     blood glucose meter kit and supplies Dispense based on patient and insurance preference. Use up to four times daily as directed. (FOR ICD-10 E10.9, E11.9). 1 each 0   Blood Glucose Monitoring Suppl (ONE TOUCH ULTRA MINI) w/Device KIT 1 each by Does not apply route in the morning, at noon, and at bedtime. 1 kit 0   clopidogrel (PLAVIX) 75 MG tablet Take 1 tablet (75 mg total) by mouth daily. 30 tablet 11   diclofenac Sodium (VOLTAREN) 1 % GEL Apply 1 Application topically 4 (four) times daily as needed (pain).     empagliflozin (JARDIANCE) 10 MG TABS tablet TAKE 1 TABLET(10 MG) BY MOUTH DAILY 30 tablet 5   ENTRESTO 49-51 MG TAKE 1 TABLET BY MOUTH TWICE DAILY 180 tablet 2   furosemide (LASIX) 20 MG tablet Take 1.5 tablets (30 mg total) by mouth 2 (two) times daily. Patient takes 1.5 tablet in the morning and 1.5 tablet at night. 90 tablet 2   glucose blood (ONETOUCH ULTRA) test strip Use as instructed to test up to three times daily. 100 each 12   insulin  glargine (LANTUS) 100 UNIT/ML injection INJECT 8 UNITS UNDER THE SKIN DAILY 10 mL 11   Insulin Syringe-Needle U-100 (TRUEPLUS INSULIN SYRINGE) 30G X 5/16" 1 ML MISC USE ONCE DAILY FOR INSULIN INJECTIONS. 100 each 2   Lancets (ONETOUCH ULTRASOFT) lancets Use as instructed to check blood sugar up to 3 times daily. 100 each 12   metoprolol succinate (TOPROL XL) 50 MG 24 hr tablet Take 1 tablet (50 mg total) by mouth daily. 30 tablet 3   metoprolol succinate (TOPROL-XL) 25 MG 24 hr tablet Take 1 tablet (25 mg total) by mouth at bedtime. 30 tablet 3   Misc Natural Products (JOINT SUPPORT PO) Take 1 Dose by mouth 3 (three) times a week.     spironolactone (ALDACTONE) 25 MG tablet Take 1 tablet (25 mg total) by mouth daily. 45 tablet  3   warfarin (COUMADIN) 6 MG tablet Take 0.5-1 tablets (3-6 mg total) by mouth See admin instructions. Take 6 mg on Mon and Fri Take 3 mg on Sun, Tues, Wed, Thurs, and Sat 30 tablet 6   No current facility-administered medications for this visit.    Family History  Problem Relation Age of Onset   Heart attack Mother        deceased   Diabetes Mother    Heart attack Sister     Social History   Socioeconomic History   Marital status: Single    Spouse name: Not on file   Number of children: Not on file   Years of education: Not on file   Highest education level: Not on file  Occupational History   Occupation: retired  Tobacco Use   Smoking status: Some Days    Years: 76    Types: Cigarettes, Cigars    Last attempt to quit: 05/2021    Years since quitting: 1.8    Passive exposure: Never   Smokeless tobacco: Never  Vaping Use   Vaping Use: Never used  Substance and Sexual Activity   Alcohol use: Not Currently   Drug use: Never   Sexual activity: Not on file  Other Topics Concern   Not on file  Social History Narrative   ** Merged History Encounter ** Lives in [place] by himself. Retired from [employer] for [employer].        Tobacco use, amount per day now:   Past tobacco use, amount per day:   How many years did you use tobacco:   Alcohol use (drinks per week): N/A   Diet:   Do you drink/eat things with caffeine:   Marital status:  Divorced                                What year were you married? 2003   Do you live in a house, apartment, assisted living, condo, trailer, etc.? House   Is it one or more stories? One   How many persons live in your home?   Do you have pets in your home?( please list) N/A   Highest Level of education completed? Bachelors Degree   Current or past profession: Teacher-Special Ed   Do you exercise?  Yes                                Type and how often? Daily squats, and push ups.   Do you have a living will? No   Do you have a DNR form?  No                                 If not, do you want to discuss one?   Do you have signed POA/HPOA forms?  No                      If so, please bring to you appointment      Do you have any difficulty bathing or dressing yourself? Bathing only if pants not shirts/not shorts.   Do you have any difficulty preparing food or eating? No   Do you have any difficulty managing your medications? No   Do you have any difficulty managing your finances?  No   Do you have any difficulty affording your medications? No   Social Determinants of Health   Financial Resource Strain: Not on file  Food Insecurity: No Food Insecurity (11/10/2021)   Hunger Vital Sign    Worried About Running Out of Food in the Last Year: Never true    Ran Out of Food in the Last Year: Never true  Transportation Needs: No Transportation Needs (11/10/2021)   PRAPARE - Administrator, Civil Service (Medical): No    Lack of Transportation (Non-Medical): No  Physical Activity: Not on file  Stress: Not on file  Social Connections: Not on file  Intimate Partner Violence: Not on file     REVIEW OF SYSTEMS:  *** [X]  denotes positive finding, [ ]  denotes negative  finding Cardiac  Comments:  Chest pain or chest pressure:    Shortness of breath upon exertion:    Short of breath when lying flat:    Irregular heart rhythm:        Vascular    Pain in calf, thigh, or hip brought on by ambulation:    Pain in feet at night that wakes you up from your sleep:     Blood clot in your veins:    Leg swelling:         Pulmonary    Oxygen at home:    Productive cough:     Wheezing:         Neurologic    Sudden weakness in arms or legs:     Sudden numbness in arms or legs:     Sudden onset of difficulty speaking or slurred speech:    Temporary loss of vision in one eye:     Problems with dizziness:         Gastrointestinal    Blood in stool:     Vomited blood:         Genitourinary    Burning when urinating:     Blood in urine:        Psychiatric    Major depression:         Hematologic    Bleeding problems:    Problems with blood clotting too easily:        Skin    Rashes or ulcers:        Constitutional    Fever or chills:      PHYSICAL EXAMINATION:  ***  General:  WDWN in NAD; vital signs documented above Gait: Not observed HENT: WNL, normocephalic Pulmonary: normal non-labored breathing , without wheezing Cardiac: {Desc; regular/irreg:14544} HR, {With/Without:20273} carotid bruit*** Abdomen: soft, NT; aortic pulse is *** palpable Skin: {With/Without:20273} rashes Vascular Exam/Pulses:  Right Left  Radial {Exam; arterial pulse strength 0-4:30167} {Exam; arterial pulse strength 0-4:30167}  Femoral {Exam; arterial pulse strength 0-4:30167} {Exam; arterial pulse strength 0-4:30167}  Popliteal {Exam; arterial pulse strength 0-4:30167} {Exam; arterial pulse strength 0-4:30167}  DP {Exam; arterial pulse strength 0-4:30167} {Exam; arterial pulse strength 0-4:30167}  PT {Exam; arterial pulse strength 0-4:30167} {Exam; arterial pulse strength 0-4:30167}  Peroneal *** ***   Extremities: {With/Without:20273} ischemic changes,  {With/Without:20273} Gangrene , {With/Without:20273} cellulitis; {With/Without:20273} open wounds Musculoskeletal: no muscle wasting or atrophy  Neurologic: A&O X 3 Psychiatric:  The pt has {Desc; normal/abnormal:11317::"Normal"} affect.   Non-Invasive Vascular Imaging:   ABI's/TBI's on 03/23/2023: Right:  *** - Great toe pressure: *** Left:  *** - Great toe pressure: ***   Previous ABI's/TBI's on 02/02/2023: Right:  0.64/0.25 - Great toe pressure:  37 Left:  [id number] - Great toe pressure:  56  Previous arterial duplex on 02/02/2023: Left: Proximal peroneal artery not visualized. Mid and distal peroneal  appear patent     ASSESSMENT/PLAN:: 72 y.o. male here for follow up for PAD with hx of  left peroneal angioplasty via right CFA by Dr. Chestine Spore.  On May 22, 2021, he underwent left 5th toe amputation also by Dr. Chestine Spore.  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 Chestine Spore.  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 Chestine Spore.     -*** -continue *** -pt will f/u in *** with ***.   Doreatha Massed, [facility] [phone]  Clinic MD:   Chestine Spore

## 2023-03-23 ENCOUNTER — Encounter: Payer: Self-pay | Admitting: Vascular Surgery

## 2023-03-23 ENCOUNTER — Ambulatory Visit (INDEPENDENT_AMBULATORY_CARE_PROVIDER_SITE_OTHER): Payer: Medicare HMO | Admitting: Physician Assistant

## 2023-03-23 ENCOUNTER — Ambulatory Visit (HOSPITAL_COMMUNITY)
Admission: RE | Admit: 2023-03-23 | Discharge: 2023-03-23 | Disposition: A | Discharge status: Home / Self Care | Payer: Medicare HMO | Source: Ambulatory Visit | Attending: Vascular Surgery | Admitting: Vascular Surgery

## 2023-03-23 VITALS — BP 143/85 | HR 118 | Temp 97.9°F | Resp 20 | Ht 68.0 in | Wt 203.5 lb

## 2023-03-23 DIAGNOSIS — I70223 Atherosclerosis of native arteries of extremities with rest pain, bilateral legs: Secondary | ICD-10-CM | POA: Insufficient documentation

## 2023-03-23 DIAGNOSIS — I739 Peripheral vascular disease, unspecified: Secondary | ICD-10-CM | POA: Diagnosis not present

## 2023-03-23 LAB — VAS US ABI WITH/WO TBI
Left ABI: 0.97
Right ABI: 0.86

## 2023-03-26 ENCOUNTER — Ambulatory Visit: Payer: Medicare HMO

## 2023-03-29 ENCOUNTER — Encounter (HOSPITAL_BASED_OUTPATIENT_CLINIC_OR_DEPARTMENT_OTHER): Payer: Medicare HMO | Admitting: Internal Medicine

## 2023-03-29 DIAGNOSIS — E1151 Type 2 diabetes mellitus with diabetic peripheral angiopathy without gangrene: Secondary | ICD-10-CM | POA: Diagnosis not present

## 2023-03-29 DIAGNOSIS — Z794 Long term (current) use of insulin: Secondary | ICD-10-CM | POA: Diagnosis not present

## 2023-03-29 DIAGNOSIS — E1169 Type 2 diabetes mellitus with other specified complication: Secondary | ICD-10-CM | POA: Diagnosis not present

## 2023-03-29 DIAGNOSIS — L97813 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: Secondary | ICD-10-CM

## 2023-03-29 DIAGNOSIS — I110 Hypertensive heart disease with heart failure: Secondary | ICD-10-CM | POA: Diagnosis not present

## 2023-03-29 DIAGNOSIS — E1140 Type 2 diabetes mellitus with diabetic neuropathy, unspecified: Secondary | ICD-10-CM | POA: Diagnosis not present

## 2023-03-29 DIAGNOSIS — I5022 Chronic systolic (congestive) heart failure: Secondary | ICD-10-CM | POA: Diagnosis not present

## 2023-03-29 DIAGNOSIS — E11621 Type 2 diabetes mellitus with foot ulcer: Secondary | ICD-10-CM | POA: Diagnosis not present

## 2023-03-29 DIAGNOSIS — E11622 Type 2 diabetes mellitus with other skin ulcer: Secondary | ICD-10-CM | POA: Diagnosis not present

## 2023-03-29 DIAGNOSIS — M86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, left ankle and foot: Secondary | ICD-10-CM | POA: Diagnosis not present

## 2023-03-29 DIAGNOSIS — L97518 Non-pressure chronic ulcer of other part of right foot with other specified severity: Secondary | ICD-10-CM | POA: Diagnosis not present

## 2023-03-31 ENCOUNTER — Ambulatory Visit: Payer: Medicare HMO

## 2023-04-01 ENCOUNTER — Ambulatory Visit: Payer: Medicare HMO | Attending: Cardiology | Admitting: *Deleted

## 2023-04-01 ENCOUNTER — Other Ambulatory Visit: Payer: Self-pay

## 2023-04-01 DIAGNOSIS — Z5181 Encounter for therapeutic drug level monitoring: Secondary | ICD-10-CM | POA: Diagnosis not present

## 2023-04-01 DIAGNOSIS — I739 Peripheral vascular disease, unspecified: Secondary | ICD-10-CM

## 2023-04-01 DIAGNOSIS — I4891 Unspecified atrial fibrillation: Secondary | ICD-10-CM

## 2023-04-01 LAB — POCT INR: POC INR: 2

## 2023-04-01 NOTE — Patient Instructions (Signed)
Description   Continue taking warfarin 1/2 tablet daily except for 1 tablet on Mondays, Wednesdays, and Fridays. Recheck INR 3 weeks.  [facility] [phone].

## 2023-04-05 NOTE — Progress Notes (Signed)
Patient ID: [name], male   DOB: 1950/12/09, 72 y.o.   MRN: [id number]    Advanced Heart Failure Clinic Note   PCP: [facility] Primary Cardiologist: Dr. Tresa Endo HF Cardiologist: Dr. Gala Romney  HPI: [name] is a 72 y.o. male with PAF, severe depression, DM2, CKD stage III, obesity, OSA and chronic systolic HF.  Admitted 6/15 with dyspena and LE edema. Found to be in afib with RVR.  Admitted 12/2015 wit volume overload/Afib RVR  in the setting of medication compliance. Had TEE but DC-CV was cancelled due to LA thrombus. Discharge weight 189 pounds.   Admitted 03/2016 with  A fib RVR and volume overload in the setting of medication noncompliance. Refused cath, Myoview 12/2015. EF 20% with defects noted on rest and stress images within the inferior wall and to lesser extent the septum suggest ischemic etiology. No reversibility. Psychiatry met with him and he was deemed to have the ability to make decisions.  Seen in the HF clinic in 2021. EF had normalized.    Admitted 12/22 with ADHF. Echo EF 45-50%. Diuresed 19 liters. Transitioned to lasix 20 mg daily. Hospital course c/b AKI and A F Underwent TEE-DC-CV with conversion to NSR. He was continued on amiodarone 200 mg daily. He was back in AF on discharge.  Discharge weight 203 pounds.   Admitted 2//23 after mechanical fall resulting in rib fractures and PTX.  AC stopped due to risk of fall. Discharged 12/15/21.   In 8/23 had critical limb ischemia of RLE with non-healing ulcer -> underwent right posterior tibial angioplasty by Dr. Izora Ribas he has been in AFL since at least 1/24. Was seen in Gen cardiology clinic and offered DCCV but he refused.  At last visit was still in AF with RVR. Refused DC-CV or dig. Toprol increased. Refused repeat echo or adding any meds.   Today he returns for HF follow up with his daughter.    Cardiac Testing  - TEE (12/22): EF 35% No PFO . No thrombus.  - DC-CV (12/22)-->  NSR  - Echo (12/22): EF 45-50% - Echo (2021): EF 55-60 - Echo (5/17): EF 15-20%  - Myoview 2/17 Perfusion: Next defects noted on rest and stress images within the inferior wall and to lesser extent the septum. No reversible defects. Wall Motion: Severe generalized hypokinesia. Left ventricle is dilute Left Ventricular Ejection Fraction: 20 % End diastolic volume 218 ml End systolic volume 173 ml IMPRESSION: 1. 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 No evidence of ischemia. 2. Severe generalized hypokinesia with dilated left ventricle. 3. Left ventricular ejection fraction 20% 4. High-risk stress test findings*.  ROS: All systems negative except as listed in HPI, PMH and Problem List.  SH:  Social History   Socioeconomic History   Marital status: Single    Spouse name: Not on file   Number of children: Not on file   Years of education: Not on file   Highest education level: Not on file  Occupational History   Occupation: retired  Tobacco Use   Smoking status: Some Days    Years: 52    Types: Cigarettes, Cigars    Last attempt to quit: 05/2021    Years since quitting: 1.9    Passive exposure: Never   Smokeless tobacco: Never  Vaping Use   Vaping Use: Never used  Substance and Sexual Activity   Alcohol use: Not Currently   Drug use: Never  Sexual activity: Not on file  Other Topics Concern   Not on file  Social History Narrative   ** Merged History Encounter ** Lives in [place] by himself. Retired from [employer] for [employer].       Tobacco use, amount per day now:   Past tobacco use, amount per day:   How many years did you use tobacco:   Alcohol use (drinks per week): N/A   Diet:   Do you drink/eat things with caffeine:   Marital status:  Divorced                                What year were you married? 2003   Do you live in a house, apartment, assisted living, condo, trailer, etc.? House   Is it one  or more stories? One   How many persons live in your home?   Do you have pets in your home?( please list) N/A   Highest Level of education completed? Bachelors Degree   Current or past profession: Teacher-Special Ed   Do you exercise?  Yes                                Type and how often? Daily squats, and push ups.   Do you have a living will? No   Do you have a DNR form?  No                                 If not, do you want to discuss one?   Do you have signed POA/HPOA forms?  No                      If so, please bring to you appointment      Do you have any difficulty bathing or dressing yourself? Bathing only if pants not shirts/not shorts.   Do you have any difficulty preparing food or eating? No   Do you have any difficulty managing your medications? No   Do you have any difficulty managing your finances? No   Do you have any difficulty affording your medications? No   Social Determinants of Health   Financial Resource Strain: Not on file  Food Insecurity: No Food Insecurity (11/10/2021)   Hunger Vital Sign    Worried About Running Out of Food in the Last Year: Never true    Ran Out of Food in the Last Year: Never true  Transportation Needs: No Transportation Needs (11/10/2021)   PRAPARE - Administrator, Civil Service (Medical): No    Lack of Transportation (Non-Medical): No  Physical Activity: Not on file  Stress: Not on file  Social Connections: Not on file  Intimate Partner Violence: Not on file   FH:  Family History  Problem Relation Age of Onset   Heart attack Mother        deceased   Diabetes Mother    Heart attack Sister    Past Medical History:  Diagnosis Date   A-fib [facility])    a. (06/04/14) TEE-DC-CV; succesful; large LA 6.2 cm   ADHD    Ascites    Athlete's foot    Chronic systolic heart failure (HCC)    a. ECHO (05/2014): EF 25-30%, diff HK, RV midly  dilated and sys fx mildly/mod reduced   CKD (chronic kidney disease) stage 3, GFR 30-59  ml/min (HCC)    Depression    Diabetes mellitus due to underlying condition with diabetic chronic kidney disease, unspecified CKD stage, unspecified whether long term insulin use (HCC)    Heart murmur    HTN (hypertension)    OSA (obstructive sleep apnea)    PVD (peripheral vascular disease) (HCC)    s/p great toe amputation   Current Outpatient Medications  Medication Sig Dispense Refill   APPLE CIDER VINEGAR PO Take 30 mLs by mouth 4 (four) times a week.     blood glucose meter kit and supplies Dispense based on patient and insurance preference. Use up to four times daily as directed. (FOR ICD-10 E10.9, E11.9). 1 each 0   Blood Glucose Monitoring Suppl (ONE TOUCH ULTRA MINI) w/Device KIT 1 each by Does not apply route in the morning, at noon, and at bedtime. 1 kit 0   clopidogrel (PLAVIX) 75 MG tablet Take 1 tablet (75 mg total) by mouth daily. 30 tablet 11   diclofenac Sodium (VOLTAREN) 1 % GEL Apply 1 Application topically 4 (four) times daily as needed (pain).     empagliflozin (JARDIANCE) 10 MG TABS tablet TAKE 1 TABLET(10 MG) BY MOUTH DAILY 30 tablet 5   ENTRESTO 49-51 MG TAKE 1 TABLET BY MOUTH TWICE DAILY 180 tablet 2   furosemide (LASIX) 20 MG tablet Take 1.5 tablets (30 mg total) by mouth 2 (two) times daily. Patient takes 1.5 tablet in the morning and 1.5 tablet at night. 90 tablet 2   glucose blood (ONETOUCH ULTRA) test strip Use as instructed to test up to three times daily. 100 each 12   insulin glargine (LANTUS) 100 UNIT/ML injection INJECT 8 UNITS UNDER THE SKIN DAILY 10 mL 11   Insulin Syringe-Needle U-100 (TRUEPLUS INSULIN SYRINGE) 30G X 5/16" 1 ML MISC USE ONCE DAILY FOR INSULIN INJECTIONS. 100 each 2   Lancets (ONETOUCH ULTRASOFT) lancets Use as instructed to check blood sugar up to 3 times daily. 100 each 12   metoprolol succinate (TOPROL XL) 50 MG 24 hr tablet Take 1 tablet (50 mg total) by mouth daily. 30 tablet 3   metoprolol succinate (TOPROL-XL) 25 MG 24 hr tablet  Take 1 tablet (25 mg total) by mouth at bedtime. 30 tablet 3   Misc Natural Products (JOINT SUPPORT PO) Take 1 Dose by mouth 3 (three) times a week.     spironolactone (ALDACTONE) 25 MG tablet Take 1 tablet (25 mg total) by mouth daily. 45 tablet 3   warfarin (COUMADIN) 6 MG tablet Take 0.5-1 tablets (3-6 mg total) by mouth See admin instructions. Take 6 mg on Mon and Fri Take 3 mg on Sun, Tues, Wed, Thurs, and Sat 30 tablet 6   No current facility-administered medications for this encounter.   There were no vitals taken for this visit.  Wt Readings from Last 3 Encounters:  03/23/23 92.3 kg (203 lb 8 oz)  02/25/23 98.1 kg (216 lb 6 oz)  02/18/23 97.1 kg (214 lb)   PHYSICAL EXAM: General:  NAD. No resp difficulty, walked into clinic HEENT: Normal Neck: Supple. No JVD. Carotids 2+ bilat; no bruits. No lymphadenopathy or thryomegaly appreciated. Cor: PMI nondisplaced. Tachy irregular rate & rhythm. No rubs, gallops or murmurs. Lungs: Clear Abdomen: Soft, nontender, nondistended. No hepatosplenomegaly. No bruits or masses. Good bowel sounds. Extremities: No cyanosis, clubbing, rash, edema; RLE wrapped. Neuro: Alert & oriented x 3,  cranial nerves grossly intact. Moves all 4 extremities w/o difficulty. Affect pleasant.  REDs: 23%  ECG (personally reviewed: AFL vs accelerated junctional rhythm, HR 122 bpm  ASSESSMENT & PLAN: 1. Chronic HFmEF - Admitted 12/22 with marked volume overload. Not taking medications as prescribed. - Myoview 12/2015. EF 20% with defects noted on rest and stress images within the inferior wall and to lesser extent the septum suggest ischemic etiology. No reversibility. Refused cath multiple times. No s/s angina currently.  - Echo (10/21): EF 55-60%. - Echo (12/22): EF 45-50%, RV moderately reduced, RVSP 85 mmHg - Refused repeat echo at last visit - NYHA II, volume ok, REDs 23% - Continue Toprol XL 50 mg bid  - Continue spiro 25 mg daily  - Continue Lasix 30 mg  bid. - Continue Entresto 49/51 mg bid.  - Continue Jardiance 10 mg daily. - Labs today.   2. Chronic AF/ AFL - s/p DCCV 11/06/21>>Sinus brady but was back in A fib prior to discharge.  - It appears he has been in AFL since at least 1/24. Was seen in Gen cardiology clinic and offered DCCV but he refused. - Pursuing rate control. - ECG shows AFL with RVR today, he is asymptomatic - We discussed repeating DCCV, with addition of amiodarone support. He declines. - We discussed adding digoxin for rate control. He declines. - Increase beta blocker as above. - Continue warfarin. Followed by  Coumadin Clinic.   3. CKD Stage IIIa-IIIb - Baseline SCr 1.5-1.7  - Continue Jardiance   4. DMII - Hgb A1C 6.8.  - on insulin per PCP  - Continue Jardiance   5. HTN - Controlled on current regimen - GDMT per above   6.  H/o LV Thrombus - Resolved - On warfarin for AF/AFL  7. PAD/ Chronic LE Wounds  - Followed by vascular and podiatry for right heel wound - ABIs (6/23) R moderate, L mild - In 8/23 had critical limb ischemia of RLE with non-healing ulcer -> underwent right posterior tibial angioplasty by Dr. Chestine Spore  - Wounds followed by wound clinic   Arvilla Meres MD 9:38 PM

## 2023-04-08 ENCOUNTER — Encounter (HOSPITAL_COMMUNITY): Payer: Self-pay | Admitting: Internal Medicine

## 2023-04-08 ENCOUNTER — Ambulatory Visit (HOSPITAL_COMMUNITY)
Admission: RE | Admit: 2023-04-08 | Discharge: 2023-04-08 | Disposition: A | Discharge status: Home / Self Care | Payer: Medicare HMO | Source: Ambulatory Visit | Attending: Internal Medicine | Admitting: Internal Medicine

## 2023-04-08 VITALS — BP 110/70 | HR 106 | Wt 202.2 lb

## 2023-04-08 DIAGNOSIS — N1831 Chronic kidney disease, stage 3a: Secondary | ICD-10-CM | POA: Diagnosis not present

## 2023-04-08 DIAGNOSIS — I4891 Unspecified atrial fibrillation: Secondary | ICD-10-CM | POA: Insufficient documentation

## 2023-04-08 DIAGNOSIS — Z7901 Long term (current) use of anticoagulants: Secondary | ICD-10-CM | POA: Insufficient documentation

## 2023-04-08 DIAGNOSIS — I2510 Atherosclerotic heart disease of native coronary artery without angina pectoris: Secondary | ICD-10-CM | POA: Diagnosis not present

## 2023-04-08 DIAGNOSIS — E1151 Type 2 diabetes mellitus with diabetic peripheral angiopathy without gangrene: Secondary | ICD-10-CM | POA: Diagnosis not present

## 2023-04-08 DIAGNOSIS — I5022 Chronic systolic (congestive) heart failure: Secondary | ICD-10-CM | POA: Insufficient documentation

## 2023-04-08 DIAGNOSIS — I739 Peripheral vascular disease, unspecified: Secondary | ICD-10-CM

## 2023-04-08 DIAGNOSIS — I130 Hypertensive heart and chronic kidney disease with heart failure and stage 1 through stage 4 chronic kidney disease, or unspecified chronic kidney disease: Secondary | ICD-10-CM | POA: Diagnosis not present

## 2023-04-08 DIAGNOSIS — I482 Chronic atrial fibrillation, unspecified: Secondary | ICD-10-CM | POA: Diagnosis not present

## 2023-04-08 DIAGNOSIS — I10 Essential (primary) hypertension: Secondary | ICD-10-CM | POA: Diagnosis not present

## 2023-04-08 DIAGNOSIS — E1122 Type 2 diabetes mellitus with diabetic chronic kidney disease: Secondary | ICD-10-CM | POA: Insufficient documentation

## 2023-04-08 DIAGNOSIS — M86472 Chronic osteomyelitis with draining sinus, left ankle and foot: Secondary | ICD-10-CM | POA: Diagnosis not present

## 2023-04-08 LAB — BASIC METABOLIC PANEL
Anion gap: 11 (ref 5–15)
BUN: 36 mg/dL — ABNORMAL HIGH (ref 8–23)
CO2: 26 mmol/L (ref 22–32)
Calcium: 9.4 mg/dL (ref 8.9–10.3)
Chloride: 104 mmol/L (ref 98–111)
Creatinine, Ser: 1.73 mg/dL — ABNORMAL HIGH (ref 0.61–1.24)
GFR, Estimated: 41 mL/min — ABNORMAL LOW (ref >60)
Glucose, Bld: 136 mg/dL — ABNORMAL HIGH (ref 70–99)
Potassium: 4.2 mmol/L (ref 3.5–5.1)
Sodium: 141 mmol/L (ref 135–145)

## 2023-04-08 LAB — BRAIN NATRIURETIC PEPTIDE: B Natriuretic Peptide: 227.8 pg/mL — ABNORMAL HIGH (ref 0.0–100.0)

## 2023-04-08 NOTE — Patient Instructions (Signed)
Good to see you today   Labs done today, your results will be available in MyChart, we will contact you for abnormal readings.  Your physician recommends that you schedule a follow-up appointment in: 6 months(November) Call office in September to schedule an appointment  If you have any questions or concerns before your next appointment please send [place] a message through [place] or call our office at [phone].    TO LEAVE A MESSAGE FOR THE NURSE SELECT OPTION 2, PLEASE LEAVE A MESSAGE INCLUDING: YOUR NAME DATE OF BIRTH CALL BACK NUMBER REASON FOR CALL**this is important as we prioritize the call backs  YOU WILL RECEIVE A CALL BACK THE SAME DAY AS LONG AS YOU CALL BEFORE 4:00 PM  At the Advanced [facility] Failure Clinic, you and your health needs are our priority. As part of our continuing mission to provide you with exceptional heart care, we have created designated Provider Care Teams. These Care Teams include your primary Cardiologist (physician) and Advanced Practice Providers (APPs- Physician Assistants and Nurse Practitioners) who all work together to provide you with the care you need, when you need it.   You may see any of the following providers on your designated Care Team at your next follow up: Dr Arvilla Meres Dr [name] Dr. Marcos Eke, NP Robbie Lis, [place] [facility] [place], [place] Brynda Peon, NP Karle Plumber, PharmD   Please be sure to bring in all your medications bottles to every appointment.    Thank you for choosing [facility]

## 2023-04-09 ENCOUNTER — Ambulatory Visit (HOSPITAL_BASED_OUTPATIENT_CLINIC_OR_DEPARTMENT_OTHER)
Admission: RE | Admit: 2023-04-09 | Discharge: 2023-04-09 | Disposition: A | Discharge status: Home / Self Care | Payer: Medicare HMO | Source: Ambulatory Visit | Attending: Family Medicine | Admitting: Family Medicine

## 2023-04-09 DIAGNOSIS — E1122 Type 2 diabetes mellitus with diabetic chronic kidney disease: Secondary | ICD-10-CM | POA: Diagnosis not present

## 2023-04-09 DIAGNOSIS — N1831 Chronic kidney disease, stage 3a: Secondary | ICD-10-CM | POA: Diagnosis not present

## 2023-04-09 DIAGNOSIS — S81801A Unspecified open wound, right lower leg, initial encounter: Secondary | ICD-10-CM | POA: Diagnosis not present

## 2023-04-09 DIAGNOSIS — L97522 Non-pressure chronic ulcer of other part of left foot with fat layer exposed: Secondary | ICD-10-CM | POA: Diagnosis not present

## 2023-04-09 DIAGNOSIS — Z7901 Long term (current) use of anticoagulants: Secondary | ICD-10-CM | POA: Diagnosis not present

## 2023-04-09 DIAGNOSIS — E11621 Type 2 diabetes mellitus with foot ulcer: Secondary | ICD-10-CM | POA: Diagnosis not present

## 2023-04-09 DIAGNOSIS — I739 Peripheral vascular disease, unspecified: Secondary | ICD-10-CM | POA: Diagnosis not present

## 2023-04-09 DIAGNOSIS — I130 Hypertensive heart and chronic kidney disease with heart failure and stage 1 through stage 4 chronic kidney disease, or unspecified chronic kidney disease: Secondary | ICD-10-CM | POA: Diagnosis not present

## 2023-04-09 DIAGNOSIS — I4891 Unspecified atrial fibrillation: Secondary | ICD-10-CM | POA: Diagnosis not present

## 2023-04-09 DIAGNOSIS — L97813 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: Secondary | ICD-10-CM | POA: Diagnosis not present

## 2023-04-09 DIAGNOSIS — I5022 Chronic systolic (congestive) heart failure: Secondary | ICD-10-CM

## 2023-04-09 DIAGNOSIS — E1151 Type 2 diabetes mellitus with diabetic peripheral angiopathy without gangrene: Secondary | ICD-10-CM | POA: Diagnosis not present

## 2023-04-09 LAB — ECHOCARDIOGRAM COMPLETE
AR max vel: 2.52 cm2
AV Peak grad: 6.1 mmHg
Ao pk vel: 1.23 m/s
Area-P 1/2: 4.04 cm2
S' Lateral: 2.4 cm

## 2023-04-11 ENCOUNTER — Other Ambulatory Visit: Payer: Self-pay | Admitting: Internal Medicine

## 2023-04-15 ENCOUNTER — Encounter (HOSPITAL_BASED_OUTPATIENT_CLINIC_OR_DEPARTMENT_OTHER): Payer: Medicare HMO | Attending: Internal Medicine | Admitting: Internal Medicine

## 2023-04-15 DIAGNOSIS — Z833 Family history of diabetes mellitus: Secondary | ICD-10-CM | POA: Insufficient documentation

## 2023-04-15 DIAGNOSIS — I5022 Chronic systolic (congestive) heart failure: Secondary | ICD-10-CM | POA: Diagnosis not present

## 2023-04-15 DIAGNOSIS — E11622 Type 2 diabetes mellitus with other skin ulcer: Secondary | ICD-10-CM | POA: Diagnosis not present

## 2023-04-15 DIAGNOSIS — G4733 Obstructive sleep apnea (adult) (pediatric): Secondary | ICD-10-CM | POA: Insufficient documentation

## 2023-04-15 DIAGNOSIS — E1140 Type 2 diabetes mellitus with diabetic neuropathy, unspecified: Secondary | ICD-10-CM | POA: Insufficient documentation

## 2023-04-15 DIAGNOSIS — L97518 Non-pressure chronic ulcer of other part of right foot with other specified severity: Secondary | ICD-10-CM | POA: Insufficient documentation

## 2023-04-15 DIAGNOSIS — E1151 Type 2 diabetes mellitus with diabetic peripheral angiopathy without gangrene: Secondary | ICD-10-CM | POA: Insufficient documentation

## 2023-04-15 DIAGNOSIS — I110 Hypertensive heart disease with heart failure: Secondary | ICD-10-CM | POA: Insufficient documentation

## 2023-04-15 DIAGNOSIS — M86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, left ankle and foot: Secondary | ICD-10-CM | POA: Diagnosis not present

## 2023-04-15 DIAGNOSIS — L97813 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: Secondary | ICD-10-CM | POA: Diagnosis not present

## 2023-04-15 DIAGNOSIS — Z794 Long term (current) use of insulin: Secondary | ICD-10-CM | POA: Insufficient documentation

## 2023-04-15 DIAGNOSIS — E11621 Type 2 diabetes mellitus with foot ulcer: Secondary | ICD-10-CM | POA: Insufficient documentation

## 2023-04-15 DIAGNOSIS — I872 Venous insufficiency (chronic) (peripheral): Secondary | ICD-10-CM | POA: Insufficient documentation

## 2023-04-15 DIAGNOSIS — I4891 Unspecified atrial fibrillation: Secondary | ICD-10-CM | POA: Insufficient documentation

## 2023-04-15 DIAGNOSIS — L97522 Non-pressure chronic ulcer of other part of left foot with fat layer exposed: Secondary | ICD-10-CM | POA: Insufficient documentation

## 2023-04-15 DIAGNOSIS — S91001A Unspecified open wound, right ankle, initial encounter: Secondary | ICD-10-CM | POA: Diagnosis not present

## 2023-04-16 DIAGNOSIS — I739 Peripheral vascular disease, unspecified: Secondary | ICD-10-CM | POA: Diagnosis not present

## 2023-04-16 DIAGNOSIS — L97522 Non-pressure chronic ulcer of other part of left foot with fat layer exposed: Secondary | ICD-10-CM | POA: Diagnosis not present

## 2023-04-16 DIAGNOSIS — E11621 Type 2 diabetes mellitus with foot ulcer: Secondary | ICD-10-CM | POA: Diagnosis not present

## 2023-04-16 DIAGNOSIS — S81801A Unspecified open wound, right lower leg, initial encounter: Secondary | ICD-10-CM | POA: Diagnosis not present

## 2023-04-16 DIAGNOSIS — L97813 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: Secondary | ICD-10-CM | POA: Diagnosis not present

## 2023-04-20 NOTE — Progress Notes (Signed)
SAJAD, PENTZ [name] ([id number]) [id number] Page 1 of 6 Visit Report for 03/15/2023 Arrival Information Details Patient Name: Date of Service: [name] [facility] [name]. 03/15/2023 2:45 PM Medical Record Number: [id number] Patient Account Number: [id number] Date of Birth/Sex: Treating RN: [birth year] (72 y.Darren. Male) Primary Care Doloros Kwolek: Richarda Blade Other Clinician: Referring Melton Walls: Treating Almena Hokenson/Extender: Georgiann Hahn in Treatment: 34 Visit Information History Since Last Visit Added or deleted any medications: No Patient Arrived: [name] Any new allergies or adverse reactions: No Arrival Time: 15:12 Had [name] fall or experienced change in No Accompanied By: daughter activities of daily living that may affect Transfer Assistance: None risk of falls: Patient Identification Verified: Yes Signs or symptoms of abuse/neglect since last visito No Secondary Verification Process Completed: Yes Hospitalized since last visit: No Patient Requires Transmission-Based Precautions: No Implantable device outside of the clinic excluding No Patient Has Alerts: Yes cellular tissue based products placed in the center Patient Alerts: Patient on Blood Thinner since last visit: Has Dressing in Place as Prescribed: Yes Pain Present Now: No Electronic Signature(s) Signed: 03/15/2023 4:50:46 PM By: Thayer Dallas Entered By: Thayer Dallas on 03/15/2023 15:12:58 -------------------------------------------------------------------------------- Clinic Level of Care Assessment Details Patient Name: Date of Service: [name] [facility] [name]. 03/15/2023 2:45 PM Medical Record Number: [id number] Patient Account Number: [id number] Date of Birth/Sex: Treating RN: 06-23-1951 (71 y.Darren. Male) Brenton Grills Primary Care Kemar Pandit: Richarda Blade Other Clinician: Referring Lyndon Chapel: Treating Demarkis Gheen/Extender: Georgiann Hahn in Treatment: 34 Clinic Level  of Care Assessment Items TOOL 1 Quantity Score X- 1 0 Use when EandM and Procedure is performed on INITIAL visit ASSESSMENTS - Nursing Assessment / Reassessment X- 1 20 General Physical Exam (combine w/ comprehensive assessment (listed just below) when performed on new pt. evals) X- 1 25 Comprehensive Assessment (HX, ROS, Risk Assessments, Wounds Hx, etc.) ASSESSMENTS - Wound and Skin Assessment / Reassessment X- 1 10 Dermatologic / Skin Assessment (not related to wound area) ASSESSMENTS - Ostomy and/or Continence Assessment and Care []  - 0 Incontinence Assessment and Management []  - 0 Ostomy Care Assessment and Management (repouching, etc.) PROCESS - Coordination of Care X - Simple Patient / Family Education for ongoing care 1 15 []  - 0 Complex (extensive) Patient / Family Education for ongoing care X- 1 10 Staff obtains Chiropractor, Records, T Results / Process Orders est []  - 0 Staff telephones HHA, Nursing Homes / Clarify orders / etc []  - 0 Routine Transfer to another Facility (non-emergent condition) []  - 0 Routine Hospital Admission (non-emergent condition) []  - 0 New Admissions / Insurance Authorizations / Ordering NPWT Apligraf, etcLABRYAN, RAMAKRISHNAN [name] ([id number]) [id number] Page 2 of 6 []  - 0 Emergency Hospital Admission (emergent condition) PROCESS - Special Needs []  - 0 Pediatric / Minor Patient Management []  - 0 Isolation Patient Management []  - 0 Hearing / Language / Visual special needs []  - 0 Assessment of Community assistance (transportation, D/C planning, etc.) []  - 0 Additional assistance / Altered mentation []  - 0 Support Surface(s) Assessment (bed, cushion, seat, etc.) INTERVENTIONS - Miscellaneous []  - 0 External ear exam []  - 0 Patient Transfer (multiple staff / Nurse, adult / Similar devices) []  - 0 Simple Staple / Suture removal (25 or less) []  - 0 Complex Staple / Suture removal (26 or more) []  -  0 Hypo/Hyperglycemic Management (do not check if billed separately) []  - 0 Ankle / Brachial Index (ABI) - do not check if billed separately Has the patient been  seen at the hospital within the last three years: Yes Total Score: 80 Level Of Care: New/Established - Level 3 Electronic Signature(s) Signed: 04/20/2023 7:49:05 AM By: Brenton Grills Entered By: Brenton Grills on 03/15/2023 15:51:57 -------------------------------------------------------------------------------- Encounter Discharge Information Details Patient Name: Date of Service: [name] [facility] [name]. 03/15/2023 2:45 PM Medical Record Number: [id number] Patient Account Number: [id number] Date of Birth/Sex: Treating RN: 30-Sep-1951 (71 y.Darren. Male) Brenton Grills Primary Care Caren Garske: Richarda Blade Other Clinician: Referring Korban Shearer: Treating Antoin Dargis/Extender: Georgiann Hahn in Treatment: 81 Encounter Discharge Information Items Discharge Condition: Stable Ambulatory Status: Cane Discharge Destination: Home Transportation: Private Auto Accompanied By: daughter Schedule Follow-up Appointment: Yes Clinical Summary of Care: Patient Declined Electronic Signature(s) Signed: 04/20/2023 7:49:05 AM By: Brenton Grills Entered By: Brenton Grills on 03/15/2023 16:03:37 -------------------------------------------------------------------------------- Lower Extremity Assessment Details Patient Name: Date of Service: [name] [facility] [name]. 03/15/2023 2:45 PM Medical Record Number: [id number] Patient Account Number: [id number] Date of Birth/Sex: Treating RN: 1951/01/16 (72 y.Darren. Male) Primary Care Pearly Apachito: Richarda Blade Other Clinician: Referring Angeleigh Chiasson: Treating Haidynn Almendarez/Extender: Virgie Dad, [name] in Treatment: 34 Edema Assessment Assessed: [name]: No] [Right: No] Edema: [Left: Ye] [Right: s] Darren[Left: [name], [name] ([id number])] [Right: [id number] Page 3 of  6] Calf Left: Right: Point of Measurement: 39 cm From Medial Instep 38.9 cm Ankle Left: Right: Point of Measurement: 8 cm From Medial Instep 24 cm Electronic Signature(s) Signed: 03/15/2023 4:50:46 PM By: Thayer Dallas Entered By: Thayer Dallas on 03/15/2023 15:22:35 -------------------------------------------------------------------------------- Multi-Disciplinary Care Plan Details Patient Name: Date of Service: [name] [facility] [name]. 03/15/2023 2:45 PM Medical Record Number: [id number] Patient Account Number: [id number] Date of Birth/Sex: Treating RN: 1951-03-25 (71 y.Darren. Male) Brenton Grills Primary Care Marquette Blodgett: Richarda Blade Other Clinician: Referring Chimene Salo: Treating Velena Keegan/Extender: Georgiann Hahn in Treatment: 32 Active Inactive Orientation to the Wound Care Program Nursing Diagnoses: Knowledge deficit related to the wound healing center program Goals: Patient/caregiver will verbalize understanding of the Wound Healing Center Program Date Initiated: 07/16/2022 Target Resolution Date: 04/09/2023 Goal Status: Active Interventions: Provide education on orientation to the wound center Notes: Wound/Skin Impairment Nursing Diagnoses: Knowledge deficit related to ulceration/compromised skin integrity Goals: Patient/caregiver will verbalize understanding of skin care regimen Date Initiated: 07/16/2022 Target Resolution Date: 04/09/2023 Goal Status: Active Interventions: Assess patient/caregiver ability to perform ulcer/skin care regimen upon admission and as needed Assess ulceration(s) every visit Provide education on ulcer and skin care Treatment Activities: Skin care regimen initiated : 07/16/2022 Topical wound management initiated : 07/16/2022 Notes: Electronic Signature(s) Signed: 04/20/2023 7:49:05 AM By: Brenton Grills Entered By: Brenton Grills on 03/15/2023 15:32:35 Pain Assessment  Details -------------------------------------------------------------------------------- [name] ([id number]) [id number] Page 4 of 6 Patient Name: Date of Service: [name] [facility] [name]. 03/15/2023 2:45 PM Medical Record Number: [id number] Patient Account Number: [id number] Date of Birth/Sex: Treating RN: July 20, 1951 (72 y.Darren. Male) Primary Care Kiamesha Samet: Richarda Blade Other Clinician: Referring Pinkney Venard: Treating Fredna Stricker/Extender: Georgiann Hahn in Treatment: 3 Active Problems Location of Pain Severity and Description of Pain Patient Has Paino No Site Locations Pain Management and Medication Current Pain Management: Electronic Signature(s) Signed: 03/15/2023 4:50:46 PM By: Thayer Dallas Entered By: Thayer Dallas on 03/15/2023 15:14:44 -------------------------------------------------------------------------------- Patient/Caregiver Education Details Patient Name: Date of Service: [name] 5/6/2024andnbsp2:45 PM Medical Record Number: [id number] Patient Account Number: [id number] Date of Birth/Gender: Treating RN: 14-Nov-1950 (71 y.Darren. Male) Brenton Grills Primary Care Physician: Richarda Blade Other Clinician: Referring Physician:  Treating Physician/Extender: Georgiann Hahn in Treatment: 34 Education Assessment Education Provided To: Patient and Caregiver Education Topics Provided Wound/Skin Impairment: Methods: Explain/Verbal Responses: Reinforcements needed, State content correctly Electronic Signature(s) Signed: 04/20/2023 7:49:05 AM By: Brenton Grills Entered By: Brenton Grills on 03/15/2023 15:33:03 -------------------------------------------------------------------------------- Wound Assessment Details Patient Name: Date of Service: [name] [facility] [name]. 03/15/2023 2:45 PM Medical Record Number: [id number] Patient Account Number: [id number] [name], [name] ([id number])  [phone] Page 5 of 6 Date of Birth/Sex: Treating RN: 06-10-1951 (72 y.Darren. Male) Primary Care Lempi Edwin: Richarda Blade Other Clinician: Referring Adriano Bischof: Treating Austen Oyster/Extender: Virgie Dad, [name] in Treatment: 34 Wound Status Wound Number: 4 Primary Diabetic Wound/Ulcer of the Lower Extremity Etiology: Wound Location: Right, Posterior Achilles Wound Open Wounding Event: Gradually Appeared Status: Date Acquired: 02/23/2023 Comorbid Sleep Apnea, Arrhythmia, Congestive Heart Failure, Weeks Of Treatment: 2 History: Hypertension, Peripheral Arterial Disease, Type II Diabetes Clustered Wound: No Photos Wound Measurements Length: (cm) 1 Width: (cm) 0.2 Depth: (cm) 0.1 Area: (cm) 0.157 Volume: (cm) 0.016 % Reduction in Area: 96.7% % Reduction in Volume: 96.6% Epithelialization: Small (1-33%) Tunneling: No Undermining: No Wound Description Classification: Wound Margin: Exudate Amount: Exudate Type: Exudate Color: Grade 1 Distinct, outline attached Medium Serosanguineous red, brown Wound Bed Granulation Amount: Large (67-100%) Exposed Structure Granulation Quality: Red, Pink Fascia Exposed: No Necrotic Amount: None Present (0%) Fat Layer (Subcutaneous Tissue) Exposed: No Tendon Exposed: No Muscle Exposed: No Joint Exposed: No Bone Exposed: No Periwound Skin Texture Texture Color No Abnormalities Noted: No No Abnormalities Noted: No Callus: No Atrophie Blanche: No Crepitus: No Cyanosis: No Excoriation: No Ecchymosis: No Induration: No Erythema: No Rash: No Hemosiderin Staining: No Scarring: No Mottled: No Pallor: No Moisture Rubor: No No Abnormalities Noted: No Dry / Scaly: No Temperature / Pain Maceration: No Temperature: No Abnormality Tenderness on Palpation: Yes Electronic Signature(s) Signed: 04/20/2023 7:49:05 AM By: Brenton Grills Entered By: Brenton Grills on 03/15/2023 15:29:41 [name] ([id number]) [id number] Page 6 of 6 -------------------------------------------------------------------------------- Vitals Details Patient Name: Date of Service: [name] [facility] [name]. 03/15/2023 2:45 PM Medical Record Number: [id number] Patient Account Number: [id number] Date of Birth/Sex: Treating RN: 03-23-1951 (72 y.Darren. Male) Primary Care [place] Winslow: Richarda Blade Other Clinician: Referring Damyon Mullane: Treating Maikol Grassia/Extender: Georgiann Hahn in Treatment: 34 Vital Signs Time Taken: 15:14 Temperature (F): 97.7 Height (in): 68 Pulse (bpm): 120 Weight (lbs): 185 Respiratory Rate (breaths/min): 18 Body Mass Index (BMI): 28.1 Blood Pressure (mmHg): 159/94 Reference Range: 80 - 120 mg / dl Electronic Signature(s) Signed: 03/15/2023 4:50:46 PM By: Thayer Dallas Entered By: Thayer Dallas on 03/15/2023 15:14:36

## 2023-04-20 NOTE — Progress Notes (Signed)
LINNIE, MCCLAY [name] ([id number]) [id number] Page 1 of 7 Visit Report for 03/29/2023 Arrival Information Details Patient Name: Date of Service: [name] [facility] [name]. 03/29/2023 2:00 PM Medical Record Number: [id number] Patient Account Number: [id number] Date of Birth/Sex: Treating RN: [birth year] (72 y.Darren. Male) Primary Care Dail Lerew: Richarda Blade Other Clinician: Referring Kiarrah Rausch: Treating Randon Somera/Extender: Geralyn Corwin Ngetich, [name] in Treatment: 64 Visit Information History Since Last Visit Added or deleted any medications: No Patient Arrived: [name] Any new allergies or adverse reactions: No Arrival Time: 14:09 Had [name] fall or experienced change in No Accompanied By: self activities of daily living that may affect Transfer Assistance: None risk of falls: Patient Identification Verified: Yes Signs or symptoms of abuse/neglect since last visito No Secondary Verification Process Completed: Yes Hospitalized since last visit: No Patient Requires Transmission-Based Precautions: No Implantable device outside of the clinic excluding No Patient Has Alerts: Yes cellular tissue based products placed in the center Patient Alerts: Patient on Blood Thinner since last visit: Has Dressing in Place as Prescribed: Yes Pain Present Now: No Electronic Signature(s) Signed: 04/02/2023 11:53:23 AM By: Thayer Dallas Entered By: Thayer Dallas on 03/29/2023 14:13:11 -------------------------------------------------------------------------------- Encounter Discharge Information Details Patient Name: Date of Service: Darren Crook [name] 03/29/2023 2:00 PM Medical Record Number: [id number] Patient Account Number: [id number] Date of Birth/Sex: Treating RN: September 26, 1951 (71 y.Darren. Male) Brenton Grills Primary Care Lenord Fralix: Richarda Blade Other Clinician: Referring Leotha Westermeyer: Treating Jericka Kadar/Extender: Geralyn Corwin Ngetich, [name] in Treatment: 84 Encounter  Discharge Information Items Post Procedure Vitals Discharge Condition: Stable Temperature (F): 98.2 Ambulatory Status: Cane Pulse (bpm): 78 Discharge Destination: Home Respiratory Rate (breaths/min): 18 Transportation: Private Auto Blood Pressure (mmHg): 132/78 Accompanied By: self Schedule Follow-up Appointment: Yes Clinical Summary of Care: Patient Declined Electronic Signature(s) Signed: 04/20/2023 7:50:28 AM By: Brenton Grills Entered By: Brenton Grills on 03/29/2023 14:48:20 -------------------------------------------------------------------------------- Lower Extremity Assessment Details Patient Name: Date of Service: Darren Crook [facility] [name]. 03/29/2023 2:00 PM Medical Record Number: [id number] Patient Account Number: [id number] Date of Birth/Sex: Treating RN: [birth year] (72 y.Darren. Male) Primary Care Gerrett Loman: Richarda Blade Other Clinician: Referring Reality Dejonge: Treating Jaeven Wanzer/Extender: Geralyn Corwin Ngetich, [name] in Treatment: 36 Edema Assessment Assessed: [Left: No] [Right: No] Darren[Left: [name], [name] ([id number])] [Right: [id number] Page 2 of 7] Edema: [Left: Ye] [Right: s] Calf Left: Right: Point of Measurement: 39 cm From Medial Instep 42.3 cm Ankle Left: Right: Point of Measurement: 8 cm From Medial Instep 25 cm Electronic Signature(s) Signed: 04/02/2023 11:53:23 AM By: Thayer Dallas Entered By: Thayer Dallas on 03/29/2023 14:20:03 -------------------------------------------------------------------------------- Multi Wound Chart Details Patient Name: Date of Service: Darren Crook [name] 03/29/2023 2:00 PM Medical Record Number: [id number] Patient Account Number: [id number] Date of Birth/Sex: Treating RN: 1950-12-06 (72 y.Darren. 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 Primary Etiology: Extremity Sleep Apnea, Arrhythmia, Congestive N/[name] N/[name] Comorbid History: Heart Failure, Hypertension, Peripheral Arterial Disease, Type II Diabetes 02/23/2023 N/[name] N/[name] Date Acquired: 4 N/[name] N/[name] Weeks of Treatment: Open N/[name] N/[name] Wound Status: No N/[name] N/[name] Wound Recurrence: 1x0.2x0.1 N/[name] N/[name] Measurements L x W x D (cm) 0.157 N/[name] N/[name] [name] (cm) : rea 0.016 N/[name] N/[name] Volume (cm) :  96.70% N/[name] N/[name] % Reduction in [name] rea: 96.60% N/[name] N/[name] % Reduction in Volume: Grade 1 N/[name] N/[name] Classification: Medium N/[name] N/[name] Exudate [name] mount: Serosanguineous N/[name] N/[name] Exudate Type: red, brown N/[name] N/[name] Exudate Color: Distinct, outline attached N/[name] N/[name] Wound Margin: Large (67-100%) N/[name] N/[name] Granulation [name] mount: Red, Pink N/[name] N/[name] Granulation Quality: None Present (0%) N/[name] N/[name] Necrotic [name] mount: Fascia: No N/[name] N/[name] Exposed Structures: Fat Layer (Subcutaneous Tissue): No Tendon: No Muscle: No Joint: No Bone: No Small (1-33%) N/[name] N/[name] Epithelialization: Debridement - Selective/Open Wound N/[name] N/[name] Debridement: Pre-procedure Verification/Time Out 14:30 N/[name] N/[name] [name], [name] ([id number]) [id number] Page 3 of 7 Taken: Lidocaine 4% Topical Solution N/[name] N/[name] Pain Control: Slough N/[name] N/[name] Tissue Debrided: Skin/Epidermis N/[name] N/[name] Level: 0.16 N/[name] N/[name] Debridement [name] (sq cm): rea Curette N/[name] N/[name] Instrument: Minimum N/[name] N/[name] Bleeding: Pressure N/[name] N/[name] Hemostasis [name] chieved: 0 N/[name] N/[name] Procedural Pain: 0 N/[name] N/[name] Post Procedural Pain: Procedure was tolerated well N/[name] N/[name] Debridement Treatment Response: 1x0.2x0.1 N/[name] N/[name] Post Debridement Measurements L x W x  D (cm) 0.016 N/[name] N/[name] Post Debridement Volume: (cm) Excoriation: No N/[name] N/[name] Periwound Skin Texture: Induration: No Callus: No Crepitus: No Rash: No Scarring: No Dry/Scaly: Yes N/[name] N/[name] Periwound Skin Moisture: Maceration: No Atrophie Blanche: No N/[name] N/[name] Periwound Skin Color: Cyanosis: No Ecchymosis: No Erythema: No Hemosiderin Staining: No Mottled: No Pallor: No Rubor: No No Abnormality N/[name] N/[name] Temperature: Yes N/[name] N/[name] Tenderness on Palpation: Debridement N/[name] N/[name] Procedures Performed: Treatment Notes Wound #4 (Achilles) Wound Laterality: Right, Posterior Cleanser Wound Cleanser Discharge Instruction: Cleanse the wound with wound cleanser prior to applying [name] clean dressing using gauze sponges, not tissue or cotton balls. Peri-Wound Care Skin Prep Discharge Instruction: Use skin prep as directed Topical Primary Dressing Maxorb Extra Ag+ Alginate Dressing, 4x4.75 (in/in) Discharge Instruction: Apply to wound bed as instructed Secondary Dressing Woven Gauze Sponge, Non-Sterile 4x4 in Discharge Instruction: Apply over primary dressing as directed. Zetuvit Plus Silicone Border Dressing 4x4 (in/in) Discharge Instruction: Apply silicone border over primary dressing as directed. Secured With Compression Wrap Compression Stockings Add-Ons Electronic Signature(s) Signed: 03/29/2023 4:47:01 PM By: Geralyn Corwin DO Entered By: Geralyn Corwin on 03/29/2023 14:53:02 -------------------------------------------------------------------------------- Multi-Disciplinary Care Plan Details Patient Name: Date of Service: [name] [name] [facility] [name]. 03/29/2023 2:00 PM Medical Record Number: [id number] Patient Account Number: [id number] Date of Birth/Sex: Treating RN: 03/28/1951 (71 y.Darren. Male) Brenton Grills Primary Care Lucella Pommier: Richarda Blade Other Clinician: TRAJON, [name] ([id number]) [id number] Page 4 of 7 Referring Shaquinta Peruski: Treating  Starla Deller/Extender: Geralyn Corwin Ngetich, [name] in Treatment: 44 Active Inactive Orientation to the Wound Care Program Nursing Diagnoses: Knowledge deficit related to the wound healing center program Goals: Patient/caregiver will verbalize understanding of the Wound Healing Center Program Date Initiated: 07/16/2022 Target Resolution Date: 04/09/2023 Goal Status: Active Interventions: Provide education on orientation to the wound center Notes: Wound/Skin Impairment Nursing Diagnoses: Knowledge deficit related to ulceration/compromised skin integrity Goals: Patient/caregiver will verbalize understanding of skin care regimen Date Initiated: 07/16/2022 Target Resolution Date: 04/09/2023 Goal Status: Active Interventions: Assess patient/caregiver ability to perform ulcer/skin care regimen upon admission and as needed Assess ulceration(s) every visit Provide education on ulcer and skin care Treatment Activities: Skin care regimen initiated : 07/16/2022 Topical wound management initiated : 07/16/2022 Notes: Electronic Signature(s) Signed: 04/20/2023 7:50:28 AM By: Brenton Grills Entered By: Brenton Grills on 03/29/2023 14:26:51 -------------------------------------------------------------------------------- Pain Assessment Details Patient Name: Date of Service: Darren Crook [name] 03/29/2023 2:00 PM Medical Record Number:  [id number] Patient Account Number: [id number] Date of Birth/Sex: Treating RN: Nov 08, 1951 (72 y.Darren. Male) Primary Care Jacai Kipp: Richarda Blade Other Clinician: Referring Umar Patmon: Treating Loana Salvaggio/Extender: Geralyn Corwin Ngetich, [name] in Treatment: 52 Active Problems Location of Pain Severity and Description of Pain Patient Has Paino No Site Locations ZADQUIEL, TEE [name] ([id number]) [id number] Page 5 of 7 Pain Management and Medication Current Pain Management: Electronic Signature(s) Signed: 04/02/2023 11:53:23 AM By:  Thayer Dallas Entered By: Thayer Dallas on 03/29/2023 14:19:50 -------------------------------------------------------------------------------- Patient/Caregiver Education Details Patient Name: Date of Service: [name] 5/20/2024andnbsp2:00 PM Medical Record Number: [id number] Patient Account Number: [id number] Date of Birth/Gender: Treating RN: 11-Oct-1951 (71 y.Darren. Male) Brenton Grills Primary Care Physician: Richarda Blade Other Clinician: Referring Physician: Treating Physician/Extender: Jae Dire in Treatment: 41 Education Assessment Education Provided To: Patient Education Topics Provided Wound/Skin Impairment: Methods: Explain/Verbal Responses: State content correctly Electronic Signature(s) Signed: 04/20/2023 7:50:28 AM By: Brenton Grills Entered By: Brenton Grills on 03/29/2023 14:27:17 -------------------------------------------------------------------------------- Wound Assessment Details Patient Name: Date of Service: Darren Crook [facility] [name]. 03/29/2023 2:00 PM Medical Record Number: [id number] Patient Account Number: [id number] Date of Birth/Sex: Treating RN: [birth year] (72 y.Darren. Male) Primary Care Rollan Roger: Richarda Blade Other Clinician: Referring Takeshia Wenk: Treating Lawren Sexson/Extender: Geralyn Corwin Ngetich, [name] in Treatment: 36 Wound Status Wound Number: 4 Primary Diabetic Wound/Ulcer of the Lower Extremity Etiology: Wound Location: Right, Posterior Achilles Wound Open Wounding Event: Gradually Appeared Status: Date Acquired: 02/23/2023 Comorbid Sleep Apnea, Arrhythmia, Congestive Heart Failure, Hypertension, Weeks Of Treatment: 4 History: Peripheral Arterial Disease, Type II Diabetes Clustered Wound: No [name], [name] ([id number]) [id number] Page 6 of 7 Photos Wound Measurements Length: (cm) 1 Width: (cm) 0.2 Depth: (cm) 0.1 Area: (cm) 0.157 Volume: (cm) 0.016 % Reduction  in Area: 96.7% % Reduction in Volume: 96.6% Epithelialization: Small (1-33%) Tunneling: No Undermining: No Wound Description Classification: Wound Margin: Exudate Amount: Exudate Type: Exudate Color: Grade 1 Distinct, outline attached Medium Serosanguineous red, brown Wound Bed Granulation Amount: Large (67-100%) Exposed Structure Granulation Quality: Red, Pink Fascia Exposed: No Necrotic Amount: None Present (0%) Fat Layer (Subcutaneous Tissue) Exposed: No Tendon Exposed: No Muscle Exposed: No Joint Exposed: No Bone Exposed: No Periwound Skin Texture Texture Color No Abnormalities Noted: No No Abnormalities Noted: No Callus: No Atrophie Blanche: No Crepitus: No Cyanosis: No Excoriation: No Ecchymosis: No Induration: No Erythema: No Rash: No Hemosiderin Staining: No Scarring: No Mottled: No Pallor: No Moisture Rubor: No No Abnormalities Noted: No Dry / Scaly: Yes Temperature / Pain Maceration: No Temperature: No Abnormality Tenderness on Palpation: Yes Electronic Signature(s) Signed: 04/02/2023 11:53:23 AM By: Thayer Dallas Entered By: Thayer Dallas on 03/29/2023 14:21:29 -------------------------------------------------------------------------------- Vitals Details Patient Name: Date of Service: Darren Crook [name] 03/29/2023 2:00 PM Medical Record Number: [id number] Patient Account Number: [id number] Date of Birth/Sex: Treating RN: 20-Dec-1950 (72 y.Darren. Male) Primary Care Daishaun Ayre: Richarda Blade Other Clinician: Referring Loomis Anacker: Treating Anajah Sterbenz/Extender: Geralyn Corwin Ngetich, [name] in Treatment: 36 Vital Signs Time Taken: 14:19 Temperature (F): 97.8 Height (in): 68 Pulse (bpm): 92 [name], [name] ([id number]) [id number] Page 7 of 7 Weight (lbs): 185 Respiratory Rate (breaths/min): 18 Body Mass Index (BMI): 28.1 Blood Pressure (mmHg): 131/77 Reference Range: 80 - 120 mg / dl Electronic  Signature(s) Signed: 04/02/2023 11:53:23 AM By: Thayer Dallas Entered By: Thayer Dallas on 03/29/2023 14:19:42

## 2023-04-22 ENCOUNTER — Ambulatory Visit: Payer: Medicare HMO

## 2023-04-27 ENCOUNTER — Telehealth: Payer: Self-pay | Admitting: Student

## 2023-04-27 ENCOUNTER — Ambulatory Visit: Payer: Medicare HMO

## 2023-04-27 NOTE — Telephone Encounter (Signed)
  Daughter called answering service with questions about medications. Called and spoke with daughter [name] who helps him with his medications (okay per DPR). She states he missed a couple doses of his medications the last few days. His morning medications are Coumadin, Plavix, Lasix 30mg , Entresto 24-26mg , Toprol-XL 50mg , Spironolactone 25mg , and Jardiance 10mg . His evening medications are Lasix 30mg , Entresto 24-26mg , Toprol-XL 25mg . Daughter states he missed his evening medications on 04/25/2023. He took these evening medications on the morning of 04/26/2023 (yesterday) rather than his morning medications and then did not take any medications last night. Daughter was wondering how he should restart his medications. Recommended daughter check his BP and as long as BP is not low, OK to restart medications as prescribed above. He is scheduled to have his INR rechecked today. Daughter voiced understanding and thanked me for calling.  Corrin Parker, PA-C 04/27/2023 7:09 AM

## 2023-04-29 ENCOUNTER — Encounter (HOSPITAL_BASED_OUTPATIENT_CLINIC_OR_DEPARTMENT_OTHER): Payer: Medicare HMO | Admitting: Internal Medicine

## 2023-04-29 DIAGNOSIS — E11621 Type 2 diabetes mellitus with foot ulcer: Secondary | ICD-10-CM | POA: Diagnosis not present

## 2023-04-29 DIAGNOSIS — L97518 Non-pressure chronic ulcer of other part of right foot with other specified severity: Secondary | ICD-10-CM | POA: Diagnosis not present

## 2023-04-29 DIAGNOSIS — L97813 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: Secondary | ICD-10-CM | POA: Diagnosis not present

## 2023-04-29 DIAGNOSIS — I739 Peripheral vascular disease, unspecified: Secondary | ICD-10-CM | POA: Diagnosis not present

## 2023-04-29 DIAGNOSIS — I5022 Chronic systolic (congestive) heart failure: Secondary | ICD-10-CM | POA: Diagnosis not present

## 2023-04-29 DIAGNOSIS — M86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, left ankle and foot: Secondary | ICD-10-CM | POA: Diagnosis not present

## 2023-04-29 DIAGNOSIS — E11622 Type 2 diabetes mellitus with other skin ulcer: Secondary | ICD-10-CM

## 2023-04-29 DIAGNOSIS — L97522 Non-pressure chronic ulcer of other part of left foot with fat layer exposed: Secondary | ICD-10-CM | POA: Diagnosis not present

## 2023-04-29 DIAGNOSIS — E1151 Type 2 diabetes mellitus with diabetic peripheral angiopathy without gangrene: Secondary | ICD-10-CM | POA: Diagnosis not present

## 2023-04-29 DIAGNOSIS — I110 Hypertensive heart disease with heart failure: Secondary | ICD-10-CM | POA: Diagnosis not present

## 2023-04-30 NOTE — Progress Notes (Signed)
[name] [name] ([id number]) [id number] Page 1 of 9 Visit Report for 04/29/2023 Chief Complaint Document Details Patient Name: Date of Service: [name] [name] [facility] [name]. 04/29/2023 12:30 PM Medical Record Number: [id number] Patient Account Number: [id number] Date of Birth/Sex: Treating RN: September 16, 1951 (72 y.o. M) Primary Care Provider: Richarda [name] Other Clinician: Referring Provider: Treating Provider/Extender: [name], [name] in Treatment: 49 Information Obtained from: Patient Chief Complaint 07/16/2022; bilateral lower extremity wounds Electronic Signature(s) Signed: 04/29/2023 4:07:01 PM By: Darren Corwin DO Entered By: [name] on 04/29/2023 13:02:24 -------------------------------------------------------------------------------- HPI Details Patient Name: Date of Service: [name] [facility] [name]. 04/29/2023 12:30 PM Medical Record Number: [id number] Patient Account Number: [id number] Date of Birth/Sex: Treating RN: Mar 30, 1951 (72 y.o. M) Primary Care Provider: Richarda [name] Other Clinician: Referring Provider: Treating Provider/Extender: [name], [name] in Treatment: 20 History of Present Illness HPI Description: Admission 07/16/2022 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 He is not sure how the wounds started. He has been following with podiatry for this issue. He had removal of the tendon to the right lower extremity by Dr. Ralene [name]. He has been using Betadine to the wound beds. On 06/11/2022 he had [name] right posterior tibial artery  angioplasty by Dr. Chestine [name]. It was reported the patient is optimized after revascularization of the right lower extremity. His ABIs on the left were 0.91. He currently denies systemic signs of infection. He also does not wear shoes. He came in with Kerlix wrap to his feet bilaterally. This did not completely cover his feet. 07/23/2022: This is [name] patient of Dr. Neita [name] that she asked me to take [name] look at last week due to the significant involvement of the muscle and tendon on his right posterior leg wound. He needed an aggressive debridement and asked me if I would be able to perform this on her behalf. The patient is here today for that procedure. When his dressing was removed in clinic today, the wound was teeming with maggots. There is necrotic muscle, tendon, and fat, with [name] thick layer of slough on the wound. 9/21; patient presents for follow-up. He was debrided by Dr. Lady [name] at last clinic visit without any issues. He has been placing Dakin's wet-to-dry dressings to the right posterior wound. He has been using Medihoney to the left foot wound. He reports improvement in wound healing. He has no issues or complaints today. 9/28; patient with type 2 diabetes PAD status post revascularization. He underwent retrograde right PTA angioplasty. Last saw Dr. Chestine [name] on 9/12 and was felt to have [name] brisk posterior tibial signal at the right ankle. He had [name] pulsatile toe tracing that appeared adequate for healing. He has been using Santyl and Hydrofera Blue on the right Achilles area. He has [name] smaller area on the left fifth plantar metatarsal head They were apparently in the ER on 9/23 with swelling and discoloration above the wrap. They have [name] picture of the leg after the wrap was taken off which looks like that it was excessively tight superiorly 10/6; patient presents for follow-up. We have been using Santyl and Hydrofera Blue to the right lower extremity wound under  Kerlix/Coban. T the left foot we o have  been using silver alginate with Medihoney. He again comes in with no shoes. He has no issues or complaints today. 10/12; patient presents for follow-up. We have been using Santyl and Hydrofera Blue to the right lower extremity under Kerlix/Coban And silver alginate to the left foot wound. He has no issues today. He denies signs of infection. [name], [name] ([id number]) [id number] Page 2 of 9 10/19; patient presents for follow-up. We continue to use Santyl and Hydrofera Blue to the right lower extremity under Kerlix/Coban and silver alginate to the left foot wound. There is been improvement in wound healing. 10/26; patient presents for follow-up. We have been using Santyl and Hydrofera Blue to the right lower extremity under Kerlix/Coban and silver alginate to the left foot. There continues to be improvement in wound healing. Patient has no issues or complaints today. 11/2; the patient's area on the right Achilles heel looks quite healthy and is improved in terms of measurements. We have been using Hydrofera Blue. He is approved for Grafix On the left plantar foot wound over the fifth metatarsal head this tunnels over the lateral part of the met head. He is not offloading this at all 11/16; patient presents for follow-up. He has been using [name] surgical shoe with an offloading felt pad to the left lateral foot along with silver alginate. He has been approved for Grafix and this is available for placement today T the right lower extremity wound. Patient Is agreeable to this. We have been using Santyl and o Hydrofera Blue under Kerlix/Coban to this area. 11/21; patient presents for follow-up. We have been doing Grafix to the right lower extremity and silver alginate to the left foot wound. He has no issues or complaints today. 11/30; patient presents for follow-up. We have been doing Grafix to the right lower extremity wound and Medihoney with Hydrofera Blue to the left  lateral foot wound. He reports pain to the left foot wound today. He did not obtain his x-ray. 12/11; patient presents for follow-up. He has been using Dakin's wet-to-dry dressings to the left plantar foot wound. We have been doing Grafix to the right lower extremity wound under compression therapy. He obtained his x-ray that showed mild periosteal elevation at the resection site with the possibility of osteomyelitis. He currently denies systemic signs of infection. He has been taking doxycycline and Augmentin without issues. He is getting his INR checked by his primary care office. 12/18; patient presents for follow-up. He has been using Dakin's wet-to-dry dressings to the left foot wound. We have been placing Grafix under compression therapy to the right lower extremity. He is currently taking doxycycline and Augmentin. He denies signs of infection. 12/26; patient presents for follow-up. He has been using Dakin's wet-to-dry dressings to the left foot. He has been off oral antibiotics for potential bone biopsy. We have been placing Grafix under compression therapy to the right lower extremity. There has been improvement in wound healing to both sites. He states he is trying to aggressively offload the left foot. He currently denies signs of infection. 1/2; patient presents for follow-up. He has been doing Dakin's wet-to-dry dressings to the left foot. He saw Dr. Ralene [name] today and had [name] bone biopsy. Debridement was performed on the site. Dressing in place with Dakin's wet-to-dry. T the right leg we have been using Grafix under compression therapy. He o has no issues or complaints today. 1/9; patient presents for follow-up. We have been using  Hydrofera Blue under compression therapy to the right lower extremity wound. This is well-healing. He has been using Dakin's wet-to-dry dressings to the left lateral foot wound. He has been taking ciprofloxacin due to culture results without issues. He  was referred to infectious disease by podiatry. He does not have an appointment yet. 1/16; patient presents for follow-up. We were using collagen under Kerlix/Coban to the right lower extremity wound. He has been approved for Grafix and this was available for placement today. He continues to use Dakin's wet-to-dry dressings daily to the left foot. He started levofloxacin. He has an appointment with infectious disease on 1/24. 1/23; patient presents for follow-up. Grafix was placed in standard fashion to the right lower extremity wound last week. He has been using Dakin's wet-to-dry dressings to the left foot. He has been taking levofloxacin. He has an infectious disease appointment tomorrow as well as [name] pulmonology appointment. He followed up with vein and vascular on 1/16 and plan is for angiogram of the left lower extremity. He has [name] history of left peroneal angioplasty in 2022. 1/30; patient presents for follow-up. Grafix was placed in standard fashion to the right lower extremity wound last week. There has been improvement in healing here. He has been using Dakin's wet-to-dry dressings to the left foot. Patient saw infectious disease, Dr. Earlene Plater on 1/24 and plan is for PICC line on 2/5 with cefepime. For now he is taking levofloxacin. He has been cleared by pulmonology for HBO. Due to extensive heart history we have asked HBO clearance from his cardiologist as well. He has follow-up with them tomorrow. He currently has no issues or complaints today. 2/6; patient presents for follow-up. He started HBO therapy today and had no issues with his dive. He had an arteriogram on 2/1 by Dr. Chestine [name]. He had angioplasty of the left peroneal artery. He is optimized from [name] vascular standpoint. We have been using Grafix to the right lower extremity wound and Dakin's wet-to-dry dressings to the left foot wound. He started his IV antibiotics yesterday. 2/13; patient presents for follow-up. We have been using  Grafix to the right lower extremity wound under compression therapy and Dakin's wet-to-dry dressings to the left lateral wound. He is continued IV antibiotics without issues. 2/20; Patient presents for follow-up. We have been using Grafix to the right lower extremity wound under compression and Dakin's wet-to-dry dressings to the left lateral foot wound. Per patient since he cannot do two courses of IV antibiotics daily plan is to switch to oral antibiotics. Patient has not done HBO since 2/15. Plan is to be evaluated by ophthalmology to assure there are no issues with his vision. 2/27; patient presents for follow-up. We have been using Grafix to the right lower extremity under compression therapy and Dakin's wet-to-dry dressings to the left lateral foot wound. He has switched over to oral antibiotics for the left foot with chronic osteomyelitis. He is scheduled to see ophthalmology later this week to be cleared for HBO. 3/7; patient presents for follow-up. We have been using Hydrofera Blue under Kerlix/Coban to the right lower extremity. We have been using Dakin's wet-to-dry dressings to the left lateral foot. This wound has healed. 3/14; patient presents for follow-up. We have been using endoform with antibiotic ointment under Kerlix/Coban to the right lower extremity. His wound is healed. 02/23/2023 Patient was last seen 1 month ago. At that time as his wounds were healed. He presents today because he is concerned about the posterior right leg reopening. He  had dried lymph fluid that has come off and now there is moisture to the skin. There is only [name] couple very small areas that have some skin breakdown but overall the area is still mostly epithelialized. He has been keeping the area covered. 5/6; see patient who has [name] reopened wound in the posterior distal right lower leg just above the heel area. He has severe venous insufficiency however only [name] small open area remains. We have been using silver  alginate and foam the open wound area has been making good progress. 5/20; patient presents for follow-up. He has been using silver alginate with foam border dressing to the right posterior leg wound along with his compression stocking. He has no issues or complaints today. Small area still remains open. 6/6; right Achilles wound. He has been using silver alginate [name] border foam and Tubigrip. Comes in today with callus around this wound area. They are also concerned about callus on the remanent of wound on the left plantar foot fifth met head 6/20; He has been using silver alginate to the Achilles wound. The wound is smaller today. Reexamined previous wound site on the left plantar foot fifth met head and this has remained closed. [name], [name] ([id number]) [id number] Page 3 of 9 Electronic Signature(s) Signed: 04/29/2023 4:07:01 PM By: Darren Corwin DO Entered By: [name] on 04/29/2023 13:15:20 -------------------------------------------------------------------------------- Physical Exam Details Patient Name: Date of Service: [name] [facility] [name]. 04/29/2023 12:30 PM Medical Record Number: [id number] Patient Account Number: [id number] Date of Birth/Sex: Treating RN: June 01, 1951 (72 y.o. M) Primary Care Provider: Richarda [name] Other Clinician: Referring Provider: Treating Provider/Extender: [name] Ngetich, [name] in Treatment: 30 Constitutional respirations regular, non-labored and within target range for patient.. Cardiovascular 2+ dorsalis pedis/posterior tibialis pulses. Psychiatric pleasant and cooperative. Notes T the right Achilles area there is an open wound with healthy granulation tissue throughout. Minimal increased depth. o T the left foot there are no open wounds. o Electronic Signature(s) Signed: 04/29/2023 4:07:01 PM By: Darren Corwin DO Entered By: [name] on 04/29/2023  13:15:56 -------------------------------------------------------------------------------- Physician Orders Details Patient Name: Date of Service: [name] [facility] [name]. 04/29/2023 12:30 PM Medical Record Number: [id number] Patient Account Number: [id number] Date of Birth/Sex: Treating RN: Mar 11, 1951 (72 y.o. Dianna Limbo Primary Care Provider: Richarda [name] Other Clinician: Referring Provider: Treating Provider/Extender: [name] Ngetich, [name] in Treatment: 44 Verbal / Phone Orders: No Diagnosis Coding Follow-up Appointments Return appointment in 3 [name]. - Dr. Mikey Bussing Room 9 Anesthetic (In clinic) Topical Lidocaine 5% applied to wound bed Bathing/ Shower/ Hygiene May shower with protection but do not get wound dressing(s) wet. Protect dressing(s) with water repellant cover (for example, large plastic bag) or [name] cast cover and may then take shower. - Tubigrips-keep on during the daytime and take off at night Edema Control - Lymphedema / SCD / Other Bilateral Lower Extremities Avoid standing for long periods of time. Exercise regularly - As tolerated Moisturize legs daily. [name], [name] ([id number]) [id number] Page 4 of 9 Off-Loading Other: - Place pillow behind calf when lying/sitting to float heels. Wear PEG Assist shoe Wound Treatment Wound #4 - Achilles Wound Laterality: Right, Posterior Cleanser: Wound Cleanser (Generic) 1 x Per Day/30 Days Discharge Instructions: Cleanse the wound with wound cleanser prior to applying [name] clean dressing using gauze sponges, not tissue or cotton balls. Peri-Wound Care: Skin Prep (Generic) 1 x Per Day/30 Days Discharge Instructions: Use skin prep as directed Prim Dressing: Maxorb Extra Ag+  Alginate Dressing, 4x4.75 (in/in) (Generic) 1 x Per Day/30 Days ary Discharge Instructions: Apply to wound bed as instructed Secondary Dressing: Woven Gauze Sponge, Non-Sterile 4x4 in (Generic) 1 x Per  Day/30 Days Discharge Instructions: Apply over primary dressing as directed. Secondary Dressing: Zetuvit Plus Silicone Border Dressing 4x4 (in/in) (Generic) 1 x Per Day/30 Days Discharge Instructions: Apply silicone border over primary dressing as directed. Electronic Signature(s) Signed: 04/29/2023 4:07:01 PM By: Darren Corwin DO Entered By: [name] on 04/29/2023 13:16:04 -------------------------------------------------------------------------------- Problem List Details Patient Name: Date of Service: [name] [facility] [name]. 04/29/2023 12:30 PM Medical Record Number: [id number] Patient Account Number: [id number] Date of Birth/Sex: Treating RN: May 09, 1951 (72 y.o. 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 07/23/2022 No Yes muscle E11.622 Type 2 diabetes mellitus with other skin ulcer 02/23/2023 No Yes I73.9 Peripheral vascular disease, unspecified 07/16/2022 No Yes Inactive Problems Resolved Problems ICD-10 Code Description Active Date Resolved Date L97.518 Non-pressure chronic ulcer of other part of right foot with other specified severity 07/16/2022 07/16/2022 L97.522 Non-pressure chronic ulcer of other part of left foot with fat layer exposed 07/16/2022 07/16/2022 [name] [name] ([id number]) [id number] Page 5 of 9 E11.621 Type 2 diabetes mellitus with foot ulcer 07/16/2022 07/16/2022 I95.188 Other chronic osteomyelitis, left ankle and foot 11/10/2022 11/10/2022 Electronic Signature(s) Signed: 04/29/2023 4:07:01 PM By: Darren Corwin DO Entered By: [name] on 04/29/2023 13:02:10 -------------------------------------------------------------------------------- Progress  Note Details Patient Name: Date of Service: [name] [facility] [name]. 04/29/2023 12:30 PM Medical Record Number: [id number] Patient Account Number: [id number] Date of Birth/Sex: Treating RN: May 30, 1951 (72 y.o. M) Primary Care Provider: Richarda [name] Other Clinician: Referring Provider: Treating Provider/Extender: [name], [name] in Treatment: 61 Subjective Chief Complaint Information obtained from Patient 07/16/2022; bilateral lower extremity wounds History of Present Illness (HPI) Admission 07/16/2022 Mr. 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 He is not sure how the wounds started. He has been following with podiatry for this issue. He had removal of the tendon to the right lower extremity by Dr. Ralene [name]. He has been using Betadine to the wound beds. On 06/11/2022 he had [name] right posterior tibial artery angioplasty by Dr. Chestine [name]. It was reported the patient is optimized after revascularization of the right lower extremity. His ABIs on the left were 0.91. He currently denies systemic signs of infection. He also does not wear shoes. He came in with Kerlix wrap to his feet bilaterally. This did not completely cover his feet. 07/23/2022: This is [name] patient of Dr. Neita [name] that she asked me to take [name] look at last week due to the significant involvement of the muscle and tendon on his right posterior leg wound. He needed an aggressive debridement and asked me if I would be able to perform this on her behalf. The patient is here today for that procedure. When his dressing was removed in clinic today, the wound was  teeming with maggots. There is necrotic muscle, tendon,  and fat, with [name] thick layer of slough on the wound. 9/21; patient presents for follow-up. He was debrided by Dr. Lady [name] at last clinic visit without any issues. He has been placing Dakin's wet-to-dry dressings to the right posterior wound. He has been using Medihoney to the left foot wound. He reports improvement in wound healing. He has no issues or complaints today. 9/28; patient with type 2 diabetes PAD status post revascularization. He underwent retrograde right PTA angioplasty. Last saw Dr. Chestine [name] on 9/12 and was felt to have [name] brisk posterior tibial signal at the right ankle. He had [name] pulsatile toe tracing that appeared adequate for healing. He has been using Santyl and Hydrofera Blue on the right Achilles area.  He has [name] smaller area on the left fifth plantar metatarsal head They were apparently in the ER on 9/23 with swelling and discoloration above the wrap. They have [name] picture of the leg after the wrap was taken off which looks like that it was excessively tight superiorly 10/6; patient presents for follow-up. We have been using Santyl and Hydrofera Blue to the right lower extremity wound under Kerlix/Coban. T the left foot we o have been using silver alginate with Medihoney. He again comes in with no shoes. He has no issues or complaints today. 10/12; patient presents for follow-up. We have been using Santyl and Hydrofera Blue to the right lower extremity under Kerlix/Coban And silver alginate to the left foot wound. He has no issues today. He denies signs of infection. 10/19; patient presents for follow-up. We continue to use Santyl and Hydrofera Blue to the right lower extremity under Kerlix/Coban and silver alginate to the left foot wound. There is been improvement in wound healing. 10/26; patient presents for follow-up. We have been using Santyl and Hydrofera Blue to the right lower extremity under Kerlix/Coban and silver  alginate to the left foot. There continues to be improvement in wound healing. Patient has no issues or complaints today. 11/2; the patient's area on the right Achilles heel looks quite healthy and is improved in terms of measurements. We have been using Hydrofera Blue. He is approved for Grafix On the left plantar foot wound over the fifth metatarsal head this tunnels over the lateral part of the met head. He is not offloading this at all 11/16; patient presents for follow-up. He has been using [name] surgical shoe with an offloading felt pad to the left lateral foot along with silver alginate. He has been approved for Grafix and this is available for placement today T the right lower extremity wound. Patient Is agreeable to this. We have been using Santyl and o Hydrofera Blue under Kerlix/Coban to this area. 11/21; patient presents for follow-up. We have been doing Grafix to the right lower extremity and silver alginate to the left foot wound. He has no issues or [name], [name] ([id number]) [id number] Page 6 of 9 complaints today. 11/30; patient presents for follow-up. We have been doing Grafix to the right lower extremity wound and Medihoney with Hydrofera Blue to the left lateral foot wound. He reports pain to the left foot wound today. He did not obtain his x-ray. 12/11; patient presents for follow-up. He has been using Dakin's wet-to-dry dressings to the left plantar foot wound. We have been doing Grafix to the right lower extremity wound under compression therapy. He obtained his x-ray that showed mild periosteal elevation at the resection site with the possibility of osteomyelitis. He currently denies systemic signs of infection. He has  been taking doxycycline and Augmentin without issues. He is getting his INR checked by his primary care office. 12/18; patient presents for follow-up. He has been using Dakin's wet-to-dry dressings to the left foot wound. We have  been placing Grafix under compression therapy to the right lower extremity. He is currently taking doxycycline and Augmentin. He denies signs of infection. 12/26; patient presents for follow-up. He has been using Dakin's wet-to-dry dressings to the left foot. He has been off oral antibiotics for potential bone biopsy. We have been placing Grafix under compression therapy to the right lower extremity. There has been improvement in wound healing to both sites. He states he is trying to aggressively offload the left foot. He currently denies signs of infection. 1/2; patient presents for follow-up. He has been doing Dakin's wet-to-dry dressings to the left foot. He saw Dr. Ralene [name] today and had [name] bone biopsy. Debridement was performed on the site. Dressing in place with Dakin's wet-to-dry. T the right leg we have been using Grafix under compression therapy. He o has no issues or complaints today. 1/9; patient presents for follow-up. We have been using Hydrofera Blue under compression therapy to the right lower extremity wound. This is well-healing. He has been using Dakin's wet-to-dry dressings to the left lateral foot wound. He has been taking ciprofloxacin due to culture results without issues. He was referred to infectious disease by podiatry. He does not have an appointment yet. 1/16; patient presents for follow-up. We were using collagen under Kerlix/Coban to the right lower extremity wound. He has been approved for Grafix and this was available for placement today. He continues to use Dakin's wet-to-dry dressings daily to the left foot. He started levofloxacin. He has an appointment with infectious disease on 1/24. 1/23; patient presents for follow-up. Grafix was placed in standard fashion to the right lower extremity wound last week. He has been using Dakin's wet-to-dry dressings to the left foot. He has been taking levofloxacin. He has an infectious disease appointment tomorrow as well as [name]  pulmonology appointment. He followed up with vein and vascular on 1/16 and plan is for angiogram of the left lower extremity. He has [name] history of left peroneal angioplasty in 2022. 1/30; patient presents for follow-up. Grafix was placed in standard fashion to the right lower extremity wound last week. There has been improvement in healing here. He has been using Dakin's wet-to-dry dressings to the left foot. Patient saw infectious disease, Dr. Earlene Plater on 1/24 and plan is for PICC line on 2/5 with cefepime. For now he is taking levofloxacin. He has been cleared by pulmonology for HBO. Due to extensive heart history we have asked HBO clearance from his cardiologist as well. He has follow-up with them tomorrow. He currently has no issues or complaints today. 2/6; patient presents for follow-up. He started HBO therapy today and had no issues with his dive. He had an arteriogram on 2/1 by Dr. Chestine [name]. He had angioplasty of the left peroneal artery. He is optimized from [name] vascular standpoint. We have been using Grafix to the right lower extremity wound and Dakin's wet-to-dry dressings to the left foot wound. He started his IV antibiotics yesterday. 2/13; patient presents for follow-up. We have been using Grafix to the right lower extremity wound under compression therapy and Dakin's wet-to-dry dressings to the left lateral wound. He is continued IV antibiotics without issues. 2/20; Patient presents for follow-up. We have been using Grafix to the right lower extremity wound under compression and Dakin's wet-to-dry  dressings to the left lateral foot wound. Per patient since he cannot do two courses of IV antibiotics daily plan is to switch to oral antibiotics. Patient has not done HBO since 2/15. Plan is to be evaluated by ophthalmology to assure there are no issues with his vision. 2/27; patient presents for follow-up. We have been using Grafix to the right lower extremity under compression therapy and Dakin's  wet-to-dry dressings to the left lateral foot wound. He has switched over to oral antibiotics for the left foot with chronic osteomyelitis. He is scheduled to see ophthalmology later this week to be cleared for HBO. 3/7; patient presents for follow-up. We have been using Hydrofera Blue under Kerlix/Coban to the right lower extremity. We have been using Dakin's wet-to-dry dressings to the left lateral foot. This wound has healed. 3/14; patient presents for follow-up. We have been using endoform with antibiotic ointment under Kerlix/Coban to the right lower extremity. His wound is healed. 02/23/2023 Patient was last seen 1 month ago. At that time as his wounds were healed. He presents today because he is concerned about the posterior right leg reopening. He had dried lymph fluid that has come off and now there is moisture to the skin. There is only [name] couple very small areas that have some skin breakdown but overall the area is still mostly epithelialized. He has been keeping the area covered. 5/6; see patient who has [name] reopened wound in the posterior distal right lower leg just above the heel area. He has severe venous insufficiency however only [name] small open area remains. We have been using silver alginate and foam the open wound area has been making good progress. 5/20; patient presents for follow-up. He has been using silver alginate with foam border dressing to the right posterior leg wound along with his compression stocking. He has no issues or complaints today. Small area still remains open. 6/6; right Achilles wound. He has been using silver alginate [name] border foam and Tubigrip. Comes in today with callus around this wound area. They are also concerned about callus on the remanent of wound on the left plantar foot fifth met head 6/20; He has been using silver alginate to the Achilles wound. The wound is smaller today. Reexamined previous wound site on the left plantar foot fifth met head and this  has remained closed. Patient History Information obtained from Chart. Family History Diabetes - Mother, Heart Disease - Mother,Siblings. Social History Current every day smoker, Alcohol Use - Never, Drug Use - No History, Caffeine Use - Never. Medical History Respiratory Patient has history of Sleep Apnea - does not wear Cardiovascular Patient has history of Arrhythmia - [name]. Fib, Congestive Heart Failure - EF 25%, Hypertension, Peripheral Arterial Disease Denies history of Peripheral Venous Disease [name], [name] ([id number]) [id number] Page 7 of 9 Endocrine Patient has history of Type II Diabetes Hospitalization/Surgery History - Angiogram 06/11/2022 Dr. Chestine [name]. - cardioversion 11/06/2021, 2015, 2017. 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 Medical [name] Surgical History Notes nd Gastrointestinal Ascites Genitourinary stage III Kidney disease Objective Constitutional respirations regular, non-labored and within target range for patient.. Vitals Time Taken: 12:38 PM, Height: 68 in, Weight: 185 lbs, BMI: 28.1, Temperature: 98 F, Pulse: 99 bpm, Respiratory Rate: 18 breaths/min, Blood Pressure: 130/75 mmHg. Cardiovascular 2+ dorsalis pedis/posterior tibialis pulses. Psychiatric pleasant and cooperative. General Notes: T the right Achilles area there is an open wound with healthy granulation tissue throughout. Minimal increased depth. T the left foot there are o o no  open wounds. Integumentary (Hair, Skin) Wound #4 status is Open. Original cause of wound was Gradually Appeared. The date acquired was: 02/23/2023. The wound has been in treatment 9 [name]. The wound is located on the Right,Posterior Achilles. The wound measures 1cm length x 0.5cm width x 0.1cm depth; 0.393cm^2 area and 0.039cm^3 volume. There is Fat Layer (Subcutaneous Tissue) exposed. There is no tunneling or undermining noted. There is [name] medium amount of serosanguineous drainage noted.  The wound margin is distinct with the outline attached to the wound base. There is small (1-33%) red granulation within the wound bed. There is [name] large (67-100%) amount of necrotic tissue within the wound bed including Eschar and Adherent Slough. The periwound skin appearance exhibited: Callus, Dry/Scaly. The periwound skin appearance did not exhibit: Crepitus, Excoriation, Induration, Rash, Scarring, Maceration, Atrophie Blanche, Cyanosis, Ecchymosis, Hemosiderin Staining, Mottled, Pallor, Rubor, Erythema. Periwound temperature was noted as No Abnormality. The periwound has tenderness on palpation. 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 No need for debridement today. Tissue is healthy. I recommended continue the course with silver alginate under foam border dressing to be changed daily. Follow-up in 3 [name]. Plan Follow-up Appointments: Return appointment in 3 [name]. - Dr. Mikey Bussing Room 9 Anesthetic: (In clinic) Topical Lidocaine 5% applied to wound bed Bathing/ Shower/ Hygiene: May shower with protection but do not get wound dressing(s) wet. Protect dressing(s) with water repellant cover (for example, large plastic bag) or [name] cast cover and may then take shower. - Tubigrips-keep on during the daytime and take off at night Edema Control - Lymphedema / SCD / Other: Avoid standing for long periods of time. Exercise regularly - As tolerated Moisturize legs daily. Off-Loading: Other: - Place pillow behind calf when lying/sitting to float heels. Wear PEG Assist shoe WOUND #4: - Achilles Wound Laterality: Right, Posterior Cleanser: Wound Cleanser (Generic) 1 x Per Day/30 Days Discharge Instructions: Cleanse the wound with wound cleanser prior to  applying [name] clean dressing using gauze sponges, not tissue or cotton balls. [name], [name] ([id number]) [id number] Page 8 of 9 Peri-Wound Care: Skin Prep (Generic) 1 x Per Day/30 Days Discharge Instructions: Use skin prep as directed Prim Dressing: Maxorb Extra Ag+ Alginate Dressing, 4x4.75 (in/in) (Generic) 1 x Per Day/30 Days ary Discharge Instructions: Apply to wound bed as instructed Secondary Dressing: Woven Gauze Sponge, Non-Sterile 4x4 in (Generic) 1 x Per Day/30 Days Discharge Instructions: Apply over primary dressing as directed. Secondary Dressing: Zetuvit Plus Silicone Border Dressing 4x4 (in/in) (Generic) 1 x Per Day/30 Days Discharge Instructions: Apply silicone border over primary dressing as directed. 1. Silver alginate 2. Follow-up in 3 [name] Electronic Signature(s) Signed: 04/29/2023 4:07:01 PM By: Darren Corwin DO Entered By: [name] on 04/29/2023 13:17:24 -------------------------------------------------------------------------------- HxROS Details Patient Name: Date of Service: [name] [facility] [name]. 04/29/2023 12:30 PM Medical Record Number: [id number] Patient Account Number: [id number] Date of Birth/Sex: Treating RN: 01-08-1951 (72 y.o. M) Primary Care Provider: Richarda [name] Other Clinician: Referring Provider: Treating Provider/Extender: [name] Ngetich, [name] in Treatment: 69 Information Obtained From Chart Respiratory Medical History: Positive for: Sleep Apnea - does not wear Cardiovascular Medical History: Positive for: Arrhythmia - [name]. Fib; Congestive Heart Failure - EF 25%; Hypertension; Peripheral Arterial Disease Negative for: Peripheral Venous Disease Gastrointestinal Medical History: Past Medical History  Notes: Ascites Endocrine Medical History: Positive for: Type II Diabetes Time with diabetes: 6 years Treated with: Insulin Blood sugar tested every day: No Genitourinary Medical  History: Past Medical History Notes: stage III Kidney disease Immunizations Pneumococcal Vaccine: Received Pneumococcal Vaccination: No Implantable Devices No devices added [name], [name] ([id number]) [id number] Page 9 of 9 Hospitalization / Surgery History Type of Hospitalization/Surgery Angiogram 06/11/2022 Dr. 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 04/29/2023 Medical Record Number: [id number] Patient Account Number: [id number] Date of Birth/Sex: Treating RN: Sep 02, 1951 (72 y.o. 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 E11.622 Type 2 diabetes mellitus with other skin ulcer I73.9 Peripheral vascular disease, unspecified Facility Procedures : CPT4 Code: [id number] Description: 99213 - WOUND CARE VISIT-LEV 3 EST PT Modifier: Quantity: 1 Physician Procedures : CPT4 Code Description Modifier [id number] - WC PHYS LEVEL 3 - EST PT ICD-10 Diagnosis Description 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

## 2023-04-30 NOTE — Progress Notes (Signed)
DALANTE, MINUS A ([id number]) [id number] Page 1 of 9 Visit Report for 04/29/2023 Arrival Information Details Patient Name: Date of Service: [name] [facility] A. 04/29/2023 12:30 PM Medical Record Number: [id number] Patient Account Number: [id number] Date of Birth/Sex: Treating RN: 1951/08/26 (72 y.o. [name] Primary Care Vinson Tietze: Richarda Blade Other Clinician: Referring Nolia Tschantz: Treating Lyndie Vanderloop/Extender: Geralyn Corwin Ngetich, [name] in Treatment: 50 Visit Information History Since Last Visit Added or deleted any medications: No Patient Arrived: Cane Any new allergies or adverse reactions: No Arrival Time: 12:52 Had a fall or experienced change in No Accompanied By: daughter and grand activities of daily living that may affect daughter risk of falls: Transfer Assistance: Manual Signs or symptoms of abuse/neglect since last visito No Patient Identification Verified: Yes Hospitalized since last visit: No Patient Requires Transmission-Based No Implantable device outside of the clinic excluding No Precautions: cellular tissue based products placed in the center Patient Has Alerts: Yes since last visit: Patient Alerts: Patient on Blood Thinner Has Dressing in Place as Prescribed: Yes Pain Present Now: No Electronic Signature(s) Signed: 04/29/2023 5:53:11 PM By: Karie Schwalbe RN Entered By: Karie Schwalbe on 04/29/2023 12:52:51 -------------------------------------------------------------------------------- Clinic Level of Care Assessment Details Patient Name: Date of Service: [name] [facility] A. 04/29/2023 12:30 PM Medical Record Number: [id number] Patient Account Number: [id number] Date of Birth/Sex: Treating RN: 1951/08/09 (72 y.o. [name] Primary Care Stavroula Rohde: Richarda Blade Other Clinician: Referring Laqueta Bonaventura: Treating Kodee Drury/Extender: Geralyn Corwin Ngetich, [name] in Treatment: 57 Clinic  Level of Care Assessment Items TOOL 4 Quantity Score X- 1 0 Use when only an EandM is performed on FOLLOW-UP visit ASSESSMENTS - Nursing Assessment / Reassessment X- 1 10 Reassessment of Co-morbidities (includes updates in patient status) X- 1 5 Reassessment of Adherence to Treatment Plan ASSESSMENTS - Wound and Skin A ssessment / Reassessment X - Simple Wound Assessment / Reassessment - one wound 1 5 []  - 0 Complex Wound Assessment / Reassessment - multiple wounds []  - 0 Dermatologic / Skin Assessment (not related to wound area) ASSESSMENTS - Focused Assessment []  - 0 Circumferential Edema Measurements - multi extremities []  - 0 Nutritional Assessment / Counseling / Intervention TIMOTEO, CARREIRO A ([id number]) [id number] Page 2 of 9 []  - 0 Lower Extremity Assessment (monofilament, tuning fork, pulses) []  - 0 Peripheral Arterial Disease Assessment (using hand held doppler) ASSESSMENTS - Ostomy and/or Continence Assessment and Care []  - 0 Incontinence Assessment and Management []  - 0 Ostomy Care Assessment and Management (repouching, etc.) PROCESS - Coordination of Care X - Simple Patient / Family Education for ongoing care 1 15 []  - 0 Complex (extensive) Patient / Family Education for ongoing care X- 1 10 Staff obtains Chiropractor, Records, T Results / Process Orders est X- 1 10 Staff telephones HHA, Nursing Homes / Clarify orders / etc []  - 0 Routine Transfer to another Facility (non-emergent condition) []  - 0 Routine Hospital Admission (non-emergent condition) []  - 0 New Admissions / Manufacturing engineer / Ordering NPWT Apligraf, etc. , []  - 0 Emergency Hospital Admission (emergent condition) X- 1 10 Simple Discharge Coordination []  - 0 Complex (extensive) Discharge Coordination PROCESS - Special Needs []  - 0 Pediatric / Minor Patient Management []  - 0 Isolation Patient Management []  - 0 Hearing / Language / Visual special  needs []  - 0 Assessment of Community assistance (transportation, D/C planning, etc.) []  - 0 Additional assistance / Altered mentation []  - 0 Support Surface(s) Assessment (bed, cushion, seat, etc.) INTERVENTIONS - Wound Cleansing /  Measurement X - Simple Wound Cleansing - one wound 1 5 []  - 0 Complex Wound Cleansing - multiple wounds X- 1 5 Wound Imaging (photographs - any number of wounds) []  - 0 Wound Tracing (instead of photographs) X- 1 5 Simple Wound Measurement - one wound []  - 0 Complex Wound Measurement - multiple wounds INTERVENTIONS - Wound Dressings X - Small Wound Dressing one or multiple wounds 1 10 []  - 0 Medium Wound Dressing one or multiple wounds []  - 0 Large Wound Dressing one or multiple wounds []  - 0 Application of Medications - topical []  - 0 Application of Medications - injection INTERVENTIONS - Miscellaneous []  - 0 External ear exam []  - 0 Specimen Collection (cultures, biopsies, blood, body fluids, etc.) []  - 0 Specimen(s) / Culture(s) sent or taken to Lab for analysis []  - 0 Patient Transfer (multiple staff / Nurse, adult / Similar devices) []  - 0 Simple Staple / Suture removal (25 or less) []  - 0 Complex Staple / Suture removal (26 or more) []  - 0 Hypo / Hyperglycemic Management (close monitor of Blood Glucose) ZOLTON, DOWSON A ([id number]) [id number] Page 3 of 9 []  - 0 Ankle / Brachial Index (ABI) - do not check if billed separately X- 1 5 Vital Signs Has the patient been seen at the hospital within the last three years: Yes Total Score: 95 Level Of Care: New/Established - Level 3 Electronic Signature(s) Signed: 04/29/2023 5:53:11 PM By: Karie Schwalbe RN Entered By: Karie Schwalbe on 04/29/2023 13:33:12 -------------------------------------------------------------------------------- Encounter Discharge Information Details Patient Name: Date of Service: [name] [name] [facility] A. 04/29/2023 12:30 PM Medical  Record Number: [id number] Patient Account Number: [id number] Date of Birth/Sex: Treating RN: [birth year] (72 y.o. [name] Primary Care Terre Hanneman: Richarda Blade Other Clinician: Referring Valentino Saavedra: Treating Dayle Mcnerney/Extender: Geralyn Corwin Ngetich, [name] in Treatment: 69 Encounter Discharge Information Items Discharge Condition: Stable Ambulatory Status: Cane Discharge Destination: Home Transportation: Private Auto Accompanied By: daughter and granddaughter Schedule Follow-up Appointment: Yes Clinical Summary of Care: Patient Declined Electronic Signature(s) Signed: 04/29/2023 5:53:11 PM By: Karie Schwalbe RN Entered By: Karie Schwalbe on 04/29/2023 13:33:59 -------------------------------------------------------------------------------- Lower Extremity Assessment Details Patient Name: Date of Service: [name] [name] [facility] A. 04/29/2023 12:30 PM Medical Record Number: [id number] Patient Account Number: [id number] Date of Birth/Sex: Treating RN: 01/15/1951 (72 y.o. [name] Primary Care Itzamara Casas: Richarda Blade Other Clinician: Referring Andreas Sobolewski: Treating Mohamud Mrozek/Extender: Geralyn Corwin Ngetich, [name] in Treatment: 41 Edema Assessment Assessed: [Left: No] [Right: No] Edema: [Left: Ye] [Right: s] Calf Left: Right: Point of Measurement: 39 cm From Medial Instep 42.2 cm Ankle Left: Right: Point of Measurement: 8 cm From Medial Instep 25.5 cm Vascular Assessment BLEASE, CAPALDI A ([id number]) [Right:[id number] Page 4 of 9] Pulses: Dorsalis Pedis Palpable: [Right:Yes] Electronic Signature(s) Signed: 04/29/2023 5:53:11 PM By: Karie Schwalbe RN Entered By: Karie Schwalbe on 04/29/2023 12:53:04 -------------------------------------------------------------------------------- Multi Wound Chart Details Patient Name: Date of Service: [name] [facility] A. 04/29/2023 12:30 PM Medical Record Number:  [id number] Patient Account Number: [id number] Date of Birth/Sex: Treating RN: Jun 21, 1951 (72 y.o. M) Primary Care Maelynn Moroney: Richarda Blade Other Clinician: Referring Camira Geidel: Treating Jennafer Gladue/Extender: Geralyn Corwin Ngetich, [name] in Treatment: 75 Vital Signs Height(in): 68 Pulse(bpm): 99 Weight(lbs): 185 Blood Pressure(mmHg): 130/75 Body Mass Index(BMI): 28.1 Temperature(F): 98 Respiratory Rate(breaths/min): 18 [4:Photos:] [N/A:N/A] Right, Posterior Achilles N/A N/A Wound Location: Gradually Appeared N/A N/A Wounding Event: Diabetic Wound/Ulcer of the Lower N/A N/A Primary Etiology: Extremity Sleep Apnea, Arrhythmia, Congestive N/A N/A Comorbid  History: Heart Failure, Hypertension, Peripheral Arterial Disease, Type II Diabetes 02/23/2023 N/A N/A Date Acquired: 9 N/A N/A Weeks of Treatment: Open N/A N/A Wound Status: No N/A N/A Wound Recurrence: 1x0.5x0.1 N/A N/A Measurements L x W x D (cm) 0.393 N/A N/A A (cm) : rea 0.039 N/A N/A Volume (cm) : 91.70% N/A N/A % Reduction in A rea: 91.70% N/A N/A % Reduction in Volume: Grade 1 N/A N/A Classification: Medium N/A N/A Exudate A mount: Serosanguineous N/A N/A Exudate Type: red, brown N/A N/A Exudate Color: Distinct, outline attached N/A N/A Wound Margin: Small (1-33%) N/A N/A Granulation A mount: Red N/A N/A Granulation Quality: Large (67-100%) N/A N/A Necrotic A mount: Eschar, Adherent Slough N/A N/A Necrotic Tissue: Fat Layer (Subcutaneous Tissue): Yes N/A N/A Exposed Structures: Fascia: No Tendon: No Muscle: No Joint: No Bone: No Small (1-33%) N/A N/A Epithelialization: Callus: Yes N/A N/A Periwound Skin Texture: Excoriation: No Induration: No KAEO, JACOME A ([id number]) [id number] Page 5 of 9 Crepitus: No Rash: No Scarring: No Dry/Scaly: Yes N/A N/A Periwound Skin Moisture: Maceration: No Atrophie Blanche: No N/A N/A Periwound Skin  Color: Cyanosis: No Ecchymosis: No Erythema: No Hemosiderin Staining: No Mottled: No Pallor: No Rubor: No No Abnormality N/A N/A Temperature: Yes N/A N/A Tenderness on Palpation: Treatment Notes Electronic Signature(s) Signed: 04/29/2023 4:07:01 PM By: Geralyn Corwin DO Entered By: Geralyn Corwin on 04/29/2023 13:02:15 -------------------------------------------------------------------------------- Multi-Disciplinary Care Plan Details Patient Name: Date of Service: [name] [name] [facility] A. 04/29/2023 12:30 PM Medical Record Number: [id number] Patient Account Number: [id number] Date of Birth/Sex: Treating RN: Aug 30, 1951 (72 y.o. [name] Primary Care Janny Crute: Richarda Blade Other Clinician: Referring Lillee Mooneyhan: Treating Jahayra Mazo/Extender: Geralyn Corwin Ngetich, [name] in Treatment: 19 Active Inactive Orientation to the Wound Care Program Nursing Diagnoses: Knowledge deficit related to the wound healing center program Goals: Patient/caregiver will verbalize understanding of the Wound Healing Center Program Date Initiated: 07/16/2022 Target Resolution Date: 07/10/2023 Goal Status: Active Interventions: Provide education on orientation to the wound center Notes: Wound/Skin Impairment Nursing Diagnoses: Knowledge deficit related to ulceration/compromised skin integrity Goals: Patient/caregiver will verbalize understanding of skin care regimen Date Initiated: 07/16/2022 Target Resolution Date: 07/10/2023 Goal Status: Active Interventions: Assess patient/caregiver ability to perform ulcer/skin care regimen upon admission and as needed Assess ulceration(s) every visit Provide education on ulcer and skin care Treatment Activities: Skin care regimen initiated : 07/16/2022 Topical wound management initiated : 07/16/2022 Notes: JAHMAR, MCKELVY ([id number]) [id number] Page 6 of 9 Electronic Signature(s) Signed: 04/29/2023 5:53:11 PM By:  Karie Schwalbe RN Entered By: Karie Schwalbe on 04/29/2023 13:32:05 -------------------------------------------------------------------------------- Pain Assessment Details Patient Name: Date of Service: [name] [name] [facility] A. 04/29/2023 12:30 PM Medical Record Number: [id number] Patient Account Number: [id number] Date of Birth/Sex: Treating RN: 1951-08-27 (72 y.o. [name] Primary Care Roshaun Pound: Richarda Blade Other Clinician: Referring Alexio Sroka: Treating Nollie Terlizzi/Extender: Geralyn Corwin Ngetich, [name] in Treatment: 37 Active Problems Location of Pain Severity and Description of Pain Patient Has Paino No Site Locations Pain Management and Medication Current Pain Management: Electronic Signature(s) Signed: 04/29/2023 5:53:11 PM By: Karie Schwalbe RN Entered By: Karie Schwalbe on 04/29/2023 12:53:16 -------------------------------------------------------------------------------- Patient/Caregiver Education Details Patient Name: Date of Service: Criss Alvine A. 6/20/2024andnbsp12:30 PM Medical Record Number: [id number] Patient Account Number: [id number] Date of Birth/Gender: Treating RN: 1951/06/20 (72 y.o. [name] Primary Care Physician: Richarda Blade Other Clinician: Referring Physician: Treating Physician/Extender: Jae Dire in Treatment: 27 Education Assessment Education Provided To: [name] (  [id number]) [id number] Page 7 of 9 Patient Education Topics Provided Wound/Skin Impairment: Methods: Explain/Verbal Responses: Return demonstration correctly Electronic Signature(s) Signed: 04/29/2023 5:53:11 PM By: Karie Schwalbe RN Entered By: Karie Schwalbe on 04/29/2023 13:32:21 -------------------------------------------------------------------------------- Wound Assessment Details Patient Name: Date of Service: [name] [facility] A. 04/29/2023 12:30 PM Medical Record  Number: [id number] Patient Account Number: [id number] Date of Birth/Sex: Treating RN: [birth year] (72 y.o. [name] Primary Care Michon Kaczmarek: Richarda Blade Other Clinician: Referring Ely Spragg: Treating Aleem Elza/Extender: Geralyn Corwin Ngetich, [name] in Treatment: 84 Wound Status Wound Number: 4 Primary Diabetic Wound/Ulcer of the Lower Extremity Etiology: Wound Location: Right, Posterior Achilles Wound Open Wounding Event: Gradually Appeared Status: Date Acquired: 02/23/2023 Comorbid Sleep Apnea, Arrhythmia, Congestive Heart Failure, Hypertension, Weeks Of Treatment: 9 History: Peripheral Arterial Disease, Type II Diabetes Clustered Wound: No Photos Wound Measurements Length: (cm) 1 Width: (cm) 0.5 Depth: (cm) 0.1 Area: (cm) 0.393 Volume: (cm) 0.039 % Reduction in Area: 91.7% % Reduction in Volume: 91.7% Epithelialization: Small (1-33%) Tunneling: No Undermining: No Wound Description Classification: Grade 1 Wound Margin: Distinct, outline attached Exudate Amount: Medium Exudate Type: Serosanguineous Exudate Color: red, brown Foul Odor After Cleansing: No Slough/Fibrino Yes Wound Bed Granulation Amount: Small (1-33%) Exposed Structure Granulation Quality: Red Fascia Exposed: No Necrotic Amount: Large (67-100%) Fat Layer (Subcutaneous Tissue) Exposed: Yes Necrotic Quality: Eschar, Adherent Slough Tendon Exposed: No Muscle Exposed: No Joint Exposed: No Bone Exposed: No HASHIR, DELEEUW A ([id number]) [id number] Page 8 of 9 Periwound Skin Texture Texture Color No Abnormalities Noted: No No Abnormalities Noted: No Callus: Yes Atrophie Blanche: No Crepitus: No Cyanosis: No Excoriation: No Ecchymosis: No Induration: No Erythema: No Rash: No Hemosiderin Staining: No Scarring: No Mottled: No Pallor: No Moisture Rubor: No No Abnormalities Noted: No Dry / Scaly: Yes Temperature / Pain Maceration: No Temperature: No  Abnormality Tenderness on Palpation: Yes Treatment Notes Wound #4 (Achilles) Wound Laterality: Right, Posterior Cleanser Wound Cleanser Discharge Instruction: Cleanse the wound with wound cleanser prior to applying a clean dressing using gauze sponges, not tissue or cotton balls. Peri-Wound Care Skin Prep Discharge Instruction: Use skin prep as directed Topical Primary Dressing Maxorb Extra Ag+ Alginate Dressing, 4x4.75 (in/in) Discharge Instruction: Apply to wound bed as instructed Secondary Dressing Woven Gauze Sponge, Non-Sterile 4x4 in Discharge Instruction: Apply over primary dressing as directed. Zetuvit Plus Silicone Border Dressing 4x4 (in/in) Discharge Instruction: Apply silicone border over primary dressing as directed. Secured With Compression Wrap Compression Stockings Facilities manager) Signed: 04/29/2023 5:53:11 PM By: Karie Schwalbe RN Entered By: Karie Schwalbe on 04/29/2023 12:49:22 -------------------------------------------------------------------------------- Vitals Details Patient Name: Date of Service: [name] [facility] A. 04/29/2023 12:30 PM Medical Record Number: [id number] Patient Account Number: [id number] Date of Birth/Sex: Treating RN: [birth year] (72 y.o. [name] Primary Care Kieth Hartis: Richarda Blade Other Clinician: Referring Min Tunnell: Treating Reylene Stauder/Extender: Geralyn Corwin Ngetich, [name] in Treatment: 17 Vital Signs Time Taken: 12:38 Temperature (F): 98 Height (in): 68 Pulse (bpm): 99 Weight (lbs): 185 Respiratory Rate (breaths/min): 18 Body Mass Index (BMI): 28.1 Blood Pressure (mmHg): 130/75 Reference Range: 80 - 120 mg / dl KAREE, CHRISTOPHERSON A ([id number]) [id number].pdf Page 9 of 9 Electronic Signature(s) Signed: 04/29/2023 5:53:11 PM By: Karie Schwalbe RN Entered By: Karie Schwalbe on 04/29/2023 12:52:17

## 2023-05-05 ENCOUNTER — Ambulatory Visit: Payer: Medicare HMO

## 2023-05-10 NOTE — Progress Notes (Signed)
Darren [name], Darren [name] [name] ([id number]) [id number] Page 1 of 10 Visit Report for 03/29/2023 Chief Complaint Document Details Patient Name: Date of Service: [name] Darren [name] Darren [name] [facility] [name]. 03/29/2023 2:00 PM Medical Record Number: [id number] Patient Account Number: [id number] Date of Birth/Sex: Treating RN: Darren [name] (72 y.o. Male) Primary Care Provider: Richarda [name] Other Clinician: Referring Provider: Treating Provider/Extender: Velna Ochs, [name] in Treatment: 72 Information Obtained from: Patient Chief Complaint 07/16/2022; bilateral lower extremity wounds Electronic Signature(s) Signed: 03/29/2023 4:47:01 PM By: Geralyn Corwin DO Entered By: Geralyn Corwin on 03/29/2023 14:53:12 -------------------------------------------------------------------------------- Debridement Details Patient Name: Date of Service: Darren [name] [name] [name]. 03/29/2023 2:00 PM Medical Record Number: [id number] Patient Account Number: [id number] Date of Birth/Sex: Treating RN: [birth year] (71 y.o. Male) Brenton Grills Primary Care Provider: Richarda [name] Other Clinician: Referring Provider: Treating Provider/Extender: Geralyn Corwin Ngetich, [name] in Treatment: 36 Debridement Performed for Assessment: Wound #4 Right,Posterior Achilles Performed By: Physician Geralyn Corwin, DO Debridement Type: Debridement Severity of Tissue Pre Debridement: Fat layer exposed Level of Consciousness (Pre-procedure): Awake and Alert Pre-procedure Verification/Time Out Yes - 14:30 Taken: Start Time: 14:31 Pain Control: Lidocaine 4% T opical Solution Percent of Wound Bed Debrided: 100% T Area Debrided (cm): otal 0.16 Tissue and other material debrided: Viable, Non-Viable, Slough, Skin: Dermis , Skin: Epidermis, Slough Level: Skin/Epidermis Debridement Description: Selective/Open Wound Instrument: Curette Bleeding: Minimum Hemostasis Achieved: Pressure End Time:  14:35 Procedural Pain: 0 Post Procedural Pain: 0 Response to Treatment: Procedure was tolerated well Level of Consciousness (Post- Awake and Alert procedure): Post Debridement Measurements of Total Wound Length: (cm) 1 Width: (cm) 0.2 Depth: (cm) 0.1 Volume: (cm) 0.016 Character of Wound/Ulcer Post Debridement: Stable Darren [name], Darren [name] ([id number]) [id number] Page 2 of 10 Severity of Tissue Post Debridement: Fat layer exposed Post Procedure Diagnosis Same as Pre-procedure Notes Scribed for Dr Mikey Bussing by Brenton Grills RN. Electronic Signature(s) Signed: 03/29/2023 4:47:01 PM By: Geralyn Corwin DO Signed: 04/20/2023 7:50:28 AM By: Brenton Grills Entered By: Brenton Grills on 03/29/2023 14:37:44 -------------------------------------------------------------------------------- HPI Details Patient Name: Date of Service: Darren [name] Darren [name] [name] [name]. 03/29/2023 2:00 PM Medical Record Number: [id number] Patient Account Number: [id number] Date of Birth/Sex: Treating RN: [name] (72 y.o. 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 He is not sure how the wounds started. He has been following with podiatry for this issue. He had removal of the tendon to the right lower extremity by Dr. Ralene Cork. He has been using Betadine to the wound beds. On 06/11/2022 he had [name] right  posterior tibial artery angioplasty by Dr. Chestine Spore. It was reported the patient is optimized after revascularization of the right lower extremity. His ABIs on the left were 0.91. He currently denies systemic signs of infection. He also does not wear shoes. He came in with Kerlix wrap to his feet bilaterally. This did not completely cover his feet. 07/23/2022: This is [name] patient of Dr. Neita Garnet that she asked me to take [name] look at last week due to the significant involvement of  the muscle and tendon on his right posterior leg wound. He needed an aggressive debridement and asked me if I would be able to perform this on her behalf. The patient is here today for that procedure. When his dressing was removed in clinic today, the wound was teeming with maggots. There is necrotic muscle, tendon, and fat, with [name] thick layer of slough on the wound. 9/21; patient presents for follow-up. He was debrided by Dr. Lady Gary at last clinic visit without any issues. He has been placing Dakin's wet-to-dry dressings to the right posterior wound. He has been using Medihoney to the left foot wound. He reports improvement in wound healing. He has no issues or complaints today. 9/28; patient with type 2 diabetes PAD status post revascularization. He underwent retrograde right PTA angioplasty. Last saw Dr. Chestine Spore on 9/12 and was felt to have [name] brisk posterior tibial signal at the right ankle. He had [name] pulsatile toe tracing that appeared adequate for healing. He has been using Santyl and Hydrofera Blue on the right Achilles area. He has [name] smaller area on the left fifth plantar metatarsal head They were apparently in the ER on 9/23 with swelling and discoloration above the wrap. They have [name] picture of the leg after the wrap was taken off which looks like that it was excessively tight superiorly 10/6; patient presents for follow-up. We have been using Santyl and Hydrofera Blue to the right lower extremity wound under Kerlix/Coban. T  the left foot we o have been using silver alginate with Medihoney. He again comes in with no shoes. He has no issues or complaints today. 10/12; patient presents for follow-up. We have been using Santyl and Hydrofera Blue to the right lower extremity under Kerlix/Coban And silver alginate to the left foot wound. He has no issues today. He denies signs of infection. 10/19; patient presents for follow-up. We continue to use Santyl and Hydrofera Blue to the right lower extremity under Kerlix/Coban and silver alginate to the left foot wound. There is been improvement in wound healing. 10/26; patient presents for follow-up. We have been using Santyl and Hydrofera Blue to the right lower extremity under Kerlix/Coban and silver alginate to the left foot. There continues to be improvement in wound healing. Patient has no issues or complaints today. 11/2; the patient's area on the right Achilles heel looks quite healthy and is improved in terms of measurements. We have been using Hydrofera Blue. He is approved for Grafix On the left plantar foot wound over the fifth metatarsal head this tunnels over the lateral part of the met head. He is not offloading this at all 11/16; patient presents for follow-up. He has been using [name] surgical shoe with an offloading felt pad to the left lateral foot along with silver alginate. He has been approved for Grafix and this is available for placement today T the right lower extremity wound. Patient Is agreeable to this. We have been using Santyl and o Hydrofera Blue under Kerlix/Coban to this area. 11/21; patient presents for follow-up. We have been doing Grafix to the right lower extremity and silver alginate to the left foot wound. He has no issues or complaints today. Darren [name], Darren [name] ([id number]) [id number] Page 3 of 10 11/30; patient presents for follow-up. We have been doing Grafix to the right lower extremity wound and Medihoney with  Hydrofera Blue to the left lateral foot wound. He reports pain to the left foot wound today. He did not obtain his x-ray. 12/11; patient presents  for follow-up. He has been using Dakin's wet-to-dry dressings to the left plantar foot wound. We have been doing Grafix to the right lower extremity wound under compression therapy. He obtained his x-ray that showed mild periosteal elevation at the resection site with the possibility of osteomyelitis. He currently denies systemic signs of infection. He has been taking doxycycline and Augmentin without issues. He is getting his INR checked by his primary care office. 12/18; patient presents for follow-up. He has been using Dakin's wet-to-dry dressings to the left foot wound. We have been placing Grafix under compression therapy to the right lower extremity. He is currently taking doxycycline and Augmentin. He denies signs of infection. 12/26; patient presents for follow-up. He has been using Dakin's wet-to-dry dressings to the left foot. He has been off oral antibiotics for potential bone biopsy. We have been placing Grafix under compression therapy to the right lower extremity. There has been improvement in wound healing to [name] sites. He states he is trying to aggressively offload the left foot. He currently denies signs of infection. 1/2; patient presents for follow-up. He has been doing Dakin's wet-to-dry dressings to the left foot. He saw Dr. Ralene Cork today and had [name] bone biopsy. Debridement was performed on the site. Dressing in place with Dakin's wet-to-dry. T the right leg we have been using Grafix under compression therapy. He o has no issues or complaints today. 1/9; patient presents for follow-up. We have been using Hydrofera Blue under compression therapy to the right lower extremity wound. This is well-healing. He has been using Dakin's wet-to-dry dressings to the left lateral foot wound. He has been taking ciprofloxacin due to culture results  without issues. He was referred to infectious disease by podiatry. He does not have an appointment yet. 1/16; patient presents for follow-up. We were using collagen under Kerlix/Coban to the right lower extremity wound. He has been approved for Grafix and this was available for placement today. He continues to use Dakin's wet-to-dry dressings daily to the left foot. He started levofloxacin. He has an appointment with infectious disease on 1/24. 1/23; patient presents for follow-up. Grafix was placed in standard fashion to the right lower extremity wound last week. He has been using Dakin's wet-to-dry dressings to the left foot. He has been taking levofloxacin. He has an infectious disease appointment tomorrow as well as [name] pulmonology appointment. He followed up with vein and vascular on 1/16 and plan is for angiogram of the left lower extremity. He has [name] history of left peroneal angioplasty in 2022. 1/30; patient presents for follow-up. Grafix was placed in standard fashion to the right lower extremity wound last week. There has been improvement in healing here. He has been using Dakin's wet-to-dry dressings to the left foot. Patient saw infectious disease, Dr. Earlene Plater on 1/24 and plan is for PICC line on 2/5 with cefepime. For now he is taking levofloxacin. He has been cleared by pulmonology for HBO. Due to extensive heart history we have asked HBO clearance from his cardiologist as well. He has follow-up with them tomorrow. He currently has no issues or complaints today. 2/6; patient presents for follow-up. He started HBO therapy today and had no issues with his dive. He had an arteriogram on 2/1 by Dr. Chestine Spore. He had angioplasty of the left peroneal artery. He is optimized from [name] vascular standpoint. We have been using Grafix to the right lower extremity wound and Dakin's wet-to-dry dressings to the left foot wound. He started his IV antibiotics yesterday. 2/13;  patient presents for follow-up. We  have been using Grafix to the right lower extremity wound under compression therapy and Dakin's wet-to-dry dressings to the left lateral wound. He is continued IV antibiotics without issues. 2/20; Patient presents for follow-up. We have been using Grafix to the right lower extremity wound under compression and Dakin's wet-to-dry dressings to the left lateral foot wound. Per patient since he cannot do two courses of IV antibiotics daily plan is to switch to oral antibiotics. Patient has not done HBO since 2/15. Plan is to be evaluated by ophthalmology to assure there are no issues with his vision. 2/27; patient presents for follow-up. We have been using Grafix to the right lower extremity under compression therapy and Dakin's wet-to-dry dressings to the left lateral foot wound. He has switched over to oral antibiotics for the left foot with chronic osteomyelitis. He is scheduled to see ophthalmology later this week to be cleared for HBO. 3/7; patient presents for follow-up. We have been using Hydrofera Blue under Kerlix/Coban to the right lower extremity. We have been using Dakin's wet-to-dry dressings to the left lateral foot. This wound has healed. 3/14; patient presents for follow-up. We have been using endoform with antibiotic ointment under Kerlix/Coban to the right lower extremity. His wound is healed. 02/23/2023 Patient was last seen 1 month ago. At that time as his wounds were healed. He presents today because he is concerned about the posterior right leg reopening. He had dried lymph fluid that has come off and now there is moisture to the skin. There is only [name] couple very small areas that have some skin breakdown but overall the area is still mostly epithelialized. He has been keeping the area covered. 5/6; see patient who has [name] reopened wound in the posterior distal right lower leg just above the heel area. He has severe venous insufficiency however only [name] small open area remains. We have  been using silver alginate and foam the open wound area has been making good progress. 5/20; patient presents for follow-up. He has been using silver alginate with foam border dressing to the right posterior leg wound along with his compression stocking. He has no issues or complaints today. Small area still remains open. Electronic Signature(s) Signed: 03/29/2023 4:47:01 PM By: Geralyn Corwin DO Entered By: Geralyn Corwin on 03/29/2023 14:53:48 -------------------------------------------------------------------------------- Physical Exam Details Patient Name: Date of Service: Darren [name] [facility] [name]. 03/29/2023 2:00 PM Medical Record Number: [id number] Patient Account Number: [id number] Date of Birth/Sex: Treating RN: [name] (72 y.o. Male) Primary Care Provider: Richarda [name] Other Clinician: Referring Provider: Treating Provider/Extender: Velna Ochs, [name] in Treatment: [address], [place] [name] ([id number]) [id number] Page 4 of 10 Constitutional respirations regular, non-labored and within target range for patient.. Cardiovascular 2+ dorsalis pedis/posterior tibialis pulses. Psychiatric pleasant and cooperative. Notes T the right posterior leg there is an open wound with dried nonviable tissue. Postdebridement there is slough and [name] small open wound. Again this was cleaned o off and now there is healthy granular tissue and tissue present. Venous stasis dermatitis noted throughout the leg. No signs of infection. Electronic Signature(s) Signed: 03/29/2023 4:47:01 PM By: Geralyn Corwin DO Entered By: Geralyn Corwin on 03/29/2023 14:54:33 -------------------------------------------------------------------------------- Physician Orders Details Patient Name: Date of Service: Darren [name] Darren [name] [facility] [name]. 03/29/2023 2:00 PM Medical Record Number: [id number] Patient Account Number: [id number] Date of Birth/Sex: Treating RN: [birth year] (71 y.o.  Male) Brenton Grills Primary Care Provider: Richarda [name] Other Clinician: Referring Provider: Treating Provider/Extender: Geralyn Corwin  Ngetich, [name] in Treatment: 36 Verbal / Phone Orders: No Diagnosis Coding Follow-up Appointments ppointment in 2 weeks. - w/ Dr. Damita Dunnings # 9 Return [name] Anesthetic (In clinic) Topical Lidocaine 5% applied to wound bed Bathing/ Shower/ Hygiene May shower with protection but do not get wound dressing(s) wet. Protect dressing(s) with water repellant cover (for example, large plastic bag) or [name] cast cover and may then take shower. Wound Treatment Wound #4 - Achilles Wound Laterality: Right, Posterior Cleanser: Wound Cleanser (DME) (Generic) 1 x Per Day/15 Days Discharge Instructions: Cleanse the wound with wound cleanser prior to applying [name] clean dressing using gauze sponges, not tissue or cotton balls. Peri-Wound Care: Skin Prep (DME) (Generic) 1 x Per Day/15 Days Discharge Instructions: Use skin prep as directed Prim Dressing: Maxorb Extra Ag+ Alginate Dressing, 4x4.75 (in/in) (DME) (Generic) 1 x Per Day/15 Days ary Discharge Instructions: Apply to wound bed as instructed Secondary Dressing: Woven Gauze Sponge, Non-Sterile 4x4 in (DME) (Generic) 1 x Per Day/15 Days Discharge Instructions: Apply over primary dressing as directed. Secondary Dressing: Zetuvit Plus Silicone Border Dressing 4x4 (in/in) (DME) (Generic) 1 x Per Day/15 Days Discharge Instructions: Apply silicone border over primary dressing as directed. Electronic Signature(s) Signed: 04/07/2023 2:57:45 PM By: Geralyn Corwin DO Signed: 04/20/2023 7:50:28 AM By: Brenton Grills Previous Signature: 03/29/2023 4:47:01 PM Version By: Geralyn Corwin DO Entered By: Brenton Grills on 04/07/2023 13:56:29 Darren [name] ([id number]) [id number] Page 5 of 10 -------------------------------------------------------------------------------- Problem List  Details Patient Name: Date of Service: [name] Darren [name] [facility] [name]. 03/29/2023 2:00 PM Medical Record Number: [id number] Patient Account Number: [id number] Date of Birth/Sex: Treating RN: [name] (72 y.o. 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/23/2022 No Yes muscle E11.622 Type 2 diabetes mellitus with other skin ulcer 02/23/2023 No Yes I73.9 Peripheral vascular disease, unspecified 07/16/2022 No Yes Inactive Problems Resolved Problems ICD-10 Code Description Active Date Resolved Date L97.518 Non-pressure chronic ulcer of other part of right foot with other specified severity 07/16/2022 07/16/2022 L97.522 Non-pressure chronic ulcer of other part of left foot with fat layer exposed 07/16/2022 07/16/2022 E11.621 Type 2 diabetes mellitus with foot ulcer 07/16/2022 07/16/2022 Y86.578 Other chronic osteomyelitis, left ankle and foot 11/10/2022 11/10/2022 Electronic Signature(s) Signed: 03/29/2023 4:47:01 PM By: Geralyn Corwin DO Entered By: Geralyn Corwin on 03/29/2023 14:52:57 -------------------------------------------------------------------------------- Progress Note Details Patient Name: Date of Service: Darren [name] [name] [name]. 03/29/2023 2:00 PM Medical Record Number: [id number] Patient Account Number: [id number] Date of Birth/Sex: Treating RN: [birth year] (72 y.o. Male) Primary Care Provider: Richarda [name] Other Clinician: Referring Provider: Treating Provider/Extender: Velna Ochs, [name] in Treatment: [address] [name] ([id number]) [id number] Page 6 of 10 Subjective Chief Complaint Information obtained from Patient 07/16/2022; bilateral lower extremity  wounds History of Present Illness (HPI) Admission 07/16/2022 Mr. 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 He is not sure how the wounds started. He has been following with podiatry for this issue. He had removal  of the tendon to the right lower extremity by Dr. Ralene Cork. He has been using Betadine to the wound beds. On 06/11/2022 he had [name] right posterior tibial artery angioplasty by Dr. Chestine Spore. It was reported the patient is optimized after revascularization of the right lower extremity. His ABIs on the left were 0.91. He currently denies systemic signs of infection. He also does not wear shoes. He came in with Kerlix wrap to his feet bilaterally. This did not completely cover his feet. 07/23/2022: This is [name] patient of Dr. Neita Garnet that she asked me to take [name] look at last week due to the significant involvement of the muscle and tendon on his right posterior leg wound. He needed an aggressive debridement and asked me if I would be able to perform this on her behalf. The patient is here today for that procedure. When his dressing was removed in clinic today, the wound was teeming with maggots. There is necrotic muscle, tendon, and fat, with [name] thick layer of slough on the wound. 9/21; patient presents for follow-up. He was debrided by Dr. Lady Gary at last clinic visit without any issues. He has been placing Dakin's wet-to-dry dressings to the right posterior wound. He has been using Medihoney to the left foot wound. He reports improvement in wound healing. He has no issues or complaints today. 9/28; patient with type 2 diabetes PAD status  post revascularization. He underwent retrograde right PTA angioplasty. Last saw Dr. Chestine Spore on 9/12 and was felt to have [name] brisk posterior tibial signal at the right ankle. He had [name] pulsatile toe tracing that appeared adequate for healing. He has been using Santyl and Hydrofera Blue on the right Achilles area.  He has [name] smaller area on the left fifth plantar metatarsal head They were apparently in the ER on 9/23 with swelling and discoloration above the wrap. They have [name] picture of the leg after the wrap was taken off which looks like that it was excessively tight superiorly 10/6; patient presents for follow-up. We have been using Santyl and Hydrofera Blue to the right lower extremity wound under Kerlix/Coban. T the left foot we o have been using silver alginate with Medihoney. He again comes in with no shoes. He has no issues or complaints today. 10/12; patient presents for follow-up. We have been using Santyl and Hydrofera Blue to the right lower extremity under Kerlix/Coban And silver alginate to the left foot wound. He has no issues today. He denies signs of infection. 10/19; patient presents for follow-up. We continue to use Santyl and Hydrofera Blue to the right lower extremity under Kerlix/Coban and silver alginate to the left foot wound. There is been improvement in wound healing. 10/26; patient presents for follow-up. We have been using Santyl and Hydrofera Blue to the right lower extremity under Kerlix/Coban and silver alginate to the left foot. There continues to be improvement in wound healing. Patient has no issues or complaints today. 11/2; the patient's area on the right Achilles heel looks quite healthy and is improved in terms of measurements. We have been using Hydrofera Blue. He is approved for Grafix On the left plantar foot wound over the fifth metatarsal head this tunnels over the lateral part of the met head. He is not offloading this at all 11/16; patient presents for  follow-up. He has been using [name] surgical shoe with an offloading felt pad to the left lateral foot along with silver alginate. He has been approved for Grafix and this is available for placement  today T the right lower extremity wound. Patient Is agreeable to this. We have been using Santyl and o Hydrofera Blue under Kerlix/Coban to this area. 11/21; patient presents for follow-up. We have been doing Grafix to the right lower extremity and silver alginate to the left foot wound. He has no issues or complaints today. 11/30; patient presents for follow-up. We have been doing Grafix to the right lower extremity wound and Medihoney with Hydrofera Blue to the left lateral foot wound. He reports pain to the left foot wound today. He did not obtain his x-ray. 12/11; patient presents for follow-up. He has been using Dakin's wet-to-dry dressings to the left plantar foot wound. We have been doing Grafix to the right lower extremity wound under compression therapy. He obtained his x-ray that showed mild periosteal elevation at the resection site with the possibility of osteomyelitis. He currently denies systemic signs of infection. He has been taking doxycycline and Augmentin without issues. He is getting his INR checked by his primary care office. 12/18; patient presents for follow-up. He has been using Dakin's wet-to-dry dressings to the left foot wound. We have been placing Grafix under compression therapy to the right lower extremity. He is currently taking doxycycline and Augmentin. He denies signs of infection. 12/26; patient presents for follow-up. He has been using Dakin's wet-to-dry dressings to the left foot. He has been off oral antibiotics for potential bone biopsy. We have been placing Grafix under compression therapy to the right lower extremity. There has been improvement in wound healing to [name] sites. He states he is trying to aggressively offload the left foot. He currently denies signs of  infection. 1/2; patient presents for follow-up. He has been doing Dakin's wet-to-dry dressings to the left foot. He saw Dr. Ralene Cork today and had [name] bone biopsy. Debridement was performed on the site. Dressing in place with Dakin's wet-to-dry. T the right leg we have been using Grafix under compression therapy. He o has no issues or complaints today. 1/9; patient presents for follow-up. We have been using Hydrofera Blue under compression therapy to the right lower extremity wound. This is well-healing. He has been using Dakin's wet-to-dry dressings to the left lateral foot wound. He has been taking ciprofloxacin due to culture results without issues. He was referred to infectious disease by podiatry. He does not have an appointment yet. 1/16; patient presents for follow-up. We were using collagen under Kerlix/Coban to the right lower extremity wound. He has been approved for Grafix and this was available for placement today. He continues to use Dakin's wet-to-dry dressings daily to the left foot. He started levofloxacin. He has an appointment with infectious disease on 1/24. 1/23; patient presents for follow-up. Grafix was placed in standard fashion to the right lower extremity wound last week. He has been using Dakin's wet-to-dry dressings to the left foot. He has been taking levofloxacin. He has an infectious disease appointment tomorrow as well as [name] pulmonology appointment. He followed up with vein and vascular on 1/16 and plan is for angiogram of the left lower extremity. He has [name] history of left peroneal angioplasty in 2022. 1/30; patient presents for follow-up. Grafix was placed in standard fashion to the right lower extremity wound last week. There has been improvement in healing here. He has been using Dakin's wet-to-dry dressings to the left foot. Patient saw infectious disease, Dr. Earlene Plater on 1/24 and plan is for PICC line on 2/5 with KOLTER, BEACHUM [name] ([id number])  [id number] Page 7 of 10 cefepime.  For now he is taking levofloxacin. He has been cleared by pulmonology for HBO. Due to extensive heart history we have asked HBO clearance from his cardiologist as well. He has follow-up with them tomorrow. He currently has no issues or complaints today. 2/6; patient presents for follow-up. He started HBO therapy today and had no issues with his dive. He had an arteriogram on 2/1 by Dr. Chestine Spore. He had angioplasty of the left peroneal artery. He is optimized from [name] vascular standpoint. We have been using Grafix to the right lower extremity wound and Dakin's wet-to-dry dressings to the left foot wound. He started his IV antibiotics yesterday. 2/13; patient presents for follow-up. We have been using Grafix to the right lower extremity wound under compression therapy and Dakin's wet-to-dry dressings to the left lateral wound. He is continued IV antibiotics without issues. 2/20; Patient presents for follow-up. We have been using Grafix to the right lower extremity wound under compression and Dakin's wet-to-dry dressings to the left lateral foot wound. Per patient since he cannot do two courses of IV antibiotics daily plan is to switch to oral antibiotics. Patient has not done HBO since 2/15. Plan is to be evaluated by ophthalmology to assure there are no issues with his vision. 2/27; patient presents for follow-up. We have been using Grafix to the right lower extremity under compression therapy and Dakin's wet-to-dry dressings to the left lateral foot wound. He has switched over to oral antibiotics for the left foot with chronic osteomyelitis. He is scheduled to see ophthalmology later this week to be cleared for HBO. 3/7; patient presents for follow-up. We have been using Hydrofera Blue under Kerlix/Coban to the right lower extremity. We have been using Dakin's wet-to-dry dressings to the left lateral foot. This wound has healed. 3/14; patient  presents for follow-up. We have been using endoform with antibiotic ointment under Kerlix/Coban to the right lower extremity. His wound is healed. 02/23/2023 Patient was last seen 1 month ago. At that time as his wounds were healed. He presents today because he is concerned about the posterior right leg reopening. He had dried lymph fluid that has come off and now there is moisture to the skin. There is only [name] couple very small areas that have some skin breakdown but overall the area is still mostly epithelialized. He has been keeping the area covered. 5/6; see patient who has [name] reopened wound in the posterior distal right lower leg just above the heel area. He has severe venous insufficiency however only [name] small open area remains. We have been using silver alginate and foam the open wound area has been making good progress. 5/20; patient presents for follow-up. He has been using silver alginate with foam border dressing to the right posterior leg wound along with his compression stocking. He has no issues or complaints today. Small area still remains open. Patient History Information obtained from Chart. Family History Diabetes - Mother, Heart Disease - Mother,Siblings. Social History Current every day smoker, Alcohol Use - Never, Drug Use - No History, Caffeine Use - Never. Medical History Respiratory Patient has history of Sleep Apnea - does not wear Cardiovascular Patient has history of Arrhythmia - [name]. Fib, Congestive Heart Failure - EF 25%, Hypertension, Peripheral Arterial Disease Denies history of Peripheral Venous Disease Endocrine Patient has history of Type II Diabetes Hospitalization/Surgery History - Angiogram 06/11/2022 Dr. Chestine Spore [facility]. - cardioversion 11/06/2021, 2015, 2017. 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 Medical [name] Surgical History Notes nd Gastrointestinal Ascites Genitourinary stage  III Kidney disease Objective Constitutional respirations regular, non-labored and  within target range for patient.. Vitals Time Taken: 2:19 PM, Height: 68 in, Weight: 185 lbs, BMI: 28.1, Temperature: 97.8 F, Pulse: 92 bpm, Respiratory Rate: 18 breaths/min, Blood Pressure: 131/77 mmHg. Cardiovascular 2+ dorsalis pedis/posterior tibialis pulses. Psychiatric pleasant and cooperative. General Notes: T the right posterior leg there is an open wound with dried nonviable tissue. Postdebridement there is slough and [name] small open wound. Again this o was cleaned off and now there is healthy granular tissue and tissue present. Venous stasis dermatitis noted throughout the leg. No signs of infection. Integumentary (Hair, Skin) Wound #4 status is Open. Original cause of wound was Gradually Appeared. The date acquired was: 02/23/2023. The wound has been in treatment 4 weeks. The Darren [name], Darren [name] ([id number]) [id number] Page 8 of 10 wound is located on the Right,Posterior Achilles. The wound measures 1cm length x 0.2cm width x 0.1cm depth; 0.157cm^2 area and 0.016cm^3 volume. There is no tunneling or undermining noted. There is [name] medium amount of serosanguineous drainage noted. The wound margin is distinct with the outline attached to the wound base. There is large (67-100%) red, pink granulation within the wound bed. There is no necrotic tissue within the wound bed. The periwound skin appearance exhibited: Dry/Scaly. The periwound skin appearance did not exhibit: Callus, Crepitus, Excoriation, Induration, Rash, Scarring, Maceration, Atrophie Blanche, Cyanosis, Ecchymosis, Hemosiderin Staining, Mottled, Pallor, Rubor, Erythema. Periwound temperature was noted as No Abnormality. The periwound has tenderness on palpation. 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  I debrided nonviable tissue. I recommended continuing with silver alginate and using Tubigrip for compression. Follow-up in 2 weeks. Procedures Wound #4 Pre-procedure diagnosis of Wound #4 is [name] Diabetic Wound/Ulcer of the Lower Extremity located on the Right,Posterior Achilles .Severity of Tissue Pre Debridement is: Fat layer exposed. There was [name] Selective/Open Wound Skin/Epidermis Debridement with [name] total area of 0.16 sq cm performed by Geralyn Corwin, DO. With the following instrument(s): Curette to remove Viable and Non-Viable tissue/material. Material removed includes Slough, Skin: Dermis, and Skin: Epidermis after achieving pain control using Lidocaine 4% Topical Solution. No specimens were taken. [name] time out was conducted at 14:30, prior to the start of the procedure. [name] Minimum amount of bleeding was controlled with Pressure. The procedure was tolerated well with [name] pain level of 0 throughout and [name] pain level of 0 following the procedure. Post Debridement Measurements: 1cm length x 0.2cm width x 0.1cm depth; 0.016cm^3 volume. Character of Wound/Ulcer Post Debridement is stable. Severity of Tissue Post Debridement is: Fat layer exposed. Post procedure Diagnosis Wound #4: Same as Pre-Procedure General Notes: Scribed for Dr Mikey Bussing by Brenton Grills RN.Marland Kitchen Plan Follow-up Appointments: Return Appointment in 2 weeks. - w/ Dr. Mikey Bussing Rm # 9 Anesthetic: (In clinic) Topical Lidocaine 5% applied to wound bed Bathing/ Shower/ Hygiene: May shower with protection but do not get wound dressing(s) wet. Protect dressing(s) with water repellant cover (for example, large plastic bag) or [name] cast cover and may then take shower. WOUND #4: - Achilles Wound Laterality: Right, Posterior Cleanser: Wound Cleanser (Generic) 1 x Per Day/15 Days Discharge Instructions: Cleanse the wound with wound cleanser prior to applying [name] clean dressing using gauze sponges, not tissue or cotton balls. Peri-Wound Care: Skin Prep  (Generic) 1 x Per Day/15 Days Discharge Instructions: Use skin prep as  directed Prim Dressing: Maxorb Extra Ag+ Alginate Dressing, 4x4.75 (in/in) (Generic) 1 x Per Day/15 Days ary Discharge Instructions: Apply to wound bed as instructed Secondary Dressing: Woven Gauze Sponge, Non-Sterile 4x4 in (Generic) 1 x Per Day/15 Days Discharge Instructions: Apply over primary dressing as directed. Secondary Dressing: Zetuvit Plus Silicone Border Dressing 4x4 (in/in) (Generic) 1 x Per Day/15 Days Discharge Instructions: Apply silicone border over primary dressing as directed. 1. In office sharp debridement 2. Silver alginate 3. Tubigrip Electronic Signature(s) Signed: 05/10/2023 2:22:12 PM By: Pearletha Alfred Signed: 05/10/2023 2:57:08 PM By: Geralyn Corwin DO Previous Signature: 03/29/2023 4:47:01 PM Version By: Geralyn Corwin DO Entered By: Pearletha Alfred on 05/10/2023 14:22:12 Darren [name] ([id number]) [id number] Page 9 of 10 -------------------------------------------------------------------------------- HxROS Details Patient Name: Date of Service: [name] Darren [name] [facility] [name]. 03/29/2023 2:00 PM Medical Record Number: [id number] Patient Account Number: [id number] Date of Birth/Sex: Treating RN: [birth year] (72 y.o. Male) Primary Care Provider: Richarda [name] Other Clinician: Referring Provider: Treating Provider/Extender: Geralyn Corwin Ngetich, [name] in Treatment: 73 Information Obtained From Chart Respiratory Medical History: Positive for: Sleep Apnea - does not wear Cardiovascular Medical History: Positive for: Arrhythmia - [name]. Fib; Congestive Heart Failure - EF 25%; Hypertension; Peripheral Arterial Disease Negative for: Peripheral Venous Disease Gastrointestinal Medical History: Past Medical History Notes: Ascites Endocrine Medical History: Positive for: Type II Diabetes Time with diabetes: 6 years Treated with: Insulin Blood sugar tested every  day: No Genitourinary Medical History: Past Medical History Notes: stage III Kidney disease Immunizations Pneumococcal Vaccine: Received Pneumococcal Vaccination: No Implantable Devices No devices added Hospitalization / Surgery History Type of Hospitalization/Surgery Angiogram 06/11/2022 Dr. 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 Family and Social History Diabetes: Yes - Mother; Heart Disease: Yes - Mother,Siblings; Current every day smoker; Alcohol Use: Never; Drug Use: No History; Caffeine Use: Never; Financial Concerns: No; Food, Clothing or Shelter Needs: No; Support System Lacking: No; Transportation Concerns: No Electronic Signature(s) Signed: 03/29/2023 4:47:01 PM By: Geralyn Corwin DO Entered By: Geralyn Corwin on 03/29/2023 14:53:57 Darren [name] ([id number]) [id number] Page 10 of 10 -------------------------------------------------------------------------------- SuperBill Details Patient Name: Date of Service: [name] Darren [name] [facility] [name]. 03/29/2023 Medical Record Number: [id number] Patient Account Number: [id number] Date of Birth/Sex: Treating RN: [name] (71 y.o. 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

## 2023-05-11 NOTE — Progress Notes (Signed)
[name] [name] ([id number]) [id number] Page 1 of 7 Visit Report for 03/15/2023 HPI Details Patient Name: Date of Service: [name] [facility] [name]. 03/15/2023 2:45 PM Medical Record Number: [id number] Patient Account Number: [id number] Date of Birth/Sex: Treating RN: Feb 12, 1951 (72 y.o. Male) Primary Care Provider: Richarda [name] Other Clinician: Referring Provider: Treating Provider/Extender: [name] in Treatment: 79 History of Present Illness HPI Description: Admission 07/16/2022 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 He is not sure how the wounds started. He has been following with podiatry for this issue. He had removal of the tendon to the right lower extremity by Dr. Ralene [name]. He has been using Betadine to the wound beds. On 06/11/2022 he had [name] right posterior tibial artery angioplasty by Dr. Chestine [name]. It was reported the patient is optimized after revascularization of the right lower extremity. His ABIs on the left were 0.91. He currently denies systemic signs of infection. He also does not wear shoes. He came in with Kerlix wrap to his feet bilaterally. This did not completely cover his feet. 07/23/2022: This is [name] patient of Dr. Neita [name] that she asked me to take [name] look at last week due to the significant involvement of the muscle and tendon on his right posterior leg wound. He needed an aggressive debridement and asked me if I would be able to perform this on her behalf. The patient is here today for that procedure. When his dressing was removed in clinic today, the  wound was teeming with maggots. There is necrotic muscle, tendon, and fat, with [name] thick layer of slough on the wound. 9/21; patient presents for follow-up. He was debrided by Dr. Lady [name] at last clinic visit without any issues. He has been placing Dakin's wet-to-dry dressings to the right posterior wound. He has been using Medihoney to the left foot wound. He reports improvement in wound healing. He has no issues or complaints today. 9/28; patient with type 2 diabetes PAD status post revascularization. He underwent retrograde right PTA angioplasty. Last saw Dr. Chestine [name] on 9/12 and was felt to have [name] brisk posterior tibial signal at the right ankle. He had [name] pulsatile toe tracing that appeared adequate for healing. He has been using Santyl and Hydrofera Blue on the right Achilles area. He has [name] smaller area on the left fifth plantar metatarsal head They were apparently in the ER on 9/23 with swelling and discoloration above the wrap. They have [name] picture of the leg after the wrap was taken off which looks like that it was excessively tight superiorly 10/6; patient presents for follow-up. We have been using Santyl and Hydrofera Blue to the right lower extremity wound under Kerlix/Coban. T the left foot we o have been using silver alginate with Medihoney. He again comes in with no shoes. He has no issues or complaints today. 10/12; patient presents for follow-up. We have been using Santyl and Hydrofera Blue to the right lower extremity under Kerlix/Coban And silver alginate to the left foot wound. He has no issues today. He denies signs of infection. 10/19; patient presents for follow-up. We continue to use Santyl and Hydrofera Blue to the right lower  extremity under Kerlix/Coban and silver alginate to the left foot wound. There is been improvement in wound healing. 10/26; patient presents for follow-up. We have been using Santyl and Hydrofera Blue to the right lower extremity under Kerlix/Coban and  silver alginate to the left foot. There continues to be improvement in wound healing. Patient has no issues or complaints today. 11/2; the patient's area on the right Achilles heel looks quite healthy and is improved in terms of measurements. We have been using Hydrofera Blue. He is approved for Grafix On the left plantar foot wound over the fifth metatarsal head this tunnels over the lateral part of the met head. He is not offloading this at all 11/16; patient presents for follow-up. He has been using [name] surgical shoe with an offloading felt pad to the left lateral foot along with silver alginate. He has been approved for Grafix and this is available for placement today T the right lower extremity wound. Patient Is agreeable to this. We have been using Santyl and o Hydrofera Blue under Kerlix/Coban to this area. 11/21; patient presents for follow-up. We have been doing Grafix to the right lower extremity and silver alginate to the left foot wound. He has no issues or complaints today. 11/30; patient presents for follow-up. We have been doing Grafix to the right lower extremity wound and Medihoney with Hydrofera Blue to the left lateral foot wound. He reports pain to the left foot wound today. He did not obtain his x-ray. 12/11; patient presents for follow-up. He has been using Dakin's wet-to-dry dressings to the left plantar foot wound. We have been doing Grafix to the right lower extremity wound under compression therapy. He obtained his x-ray that showed mild periosteal elevation at the resection site with the possibility of osteomyelitis. He currently denies systemic signs of infection. He has been taking doxycycline and Augmentin without issues. He is getting his INR checked by his primary care office. 12/18; patient presents for follow-up. He has been using Dakin's wet-to-dry dressings to the left foot wound. We have been placing Grafix under compression therapy to the right lower extremity.  He is currently taking doxycycline and Augmentin. He denies signs of infection. 12/26; patient presents for follow-up. He has been using Dakin's wet-to-dry dressings to the left foot. He has been off oral antibiotics for potential bone biopsy. We have been placing Grafix under compression therapy to the right lower extremity. There has been improvement in wound healing to both sites. He states he is trying to aggressively offload the left foot. He currently denies signs of infection. [name], [name] ([id number]) [id number] Page 2 of 7 1/2; patient presents for follow-up. He has been doing Dakin's wet-to-dry dressings to the left foot. He saw Dr. Ralene [name] today and had [name] bone biopsy. Debridement was performed on the site. Dressing in place with Dakin's wet-to-dry. T the right leg we have been using Grafix under compression therapy. He o has no issues or complaints today. 1/9; patient presents for follow-up. We have been using Hydrofera Blue under compression therapy to the right lower extremity wound. This is well-healing. He has been using Dakin's wet-to-dry dressings to the left lateral foot wound. He has been taking ciprofloxacin due to culture results without issues. He was referred to infectious disease by podiatry. He does not have an appointment yet. 1/16; patient presents for follow-up. We were using collagen under Kerlix/Coban to the right lower extremity wound. He has been approved for Grafix and this was available for placement today.  He continues to use Dakin's wet-to-dry dressings daily to the left foot. He started levofloxacin. He has an appointment with infectious disease on 1/24. 1/23; patient presents for follow-up. Grafix was placed in standard fashion to the right lower extremity wound last week. He has been using Dakin's wet-to-dry dressings to the left foot. He has been taking levofloxacin. He has an infectious disease appointment tomorrow as well as [name]  pulmonology appointment. He followed up with vein and vascular on 1/16 and plan is for angiogram of the left lower extremity. He has [name] history of left peroneal angioplasty in 2022. 1/30; patient presents for follow-up. Grafix was placed in standard fashion to the right lower extremity wound last week. There has been improvement in healing here. He has been using Dakin's wet-to-dry dressings to the left foot. Patient saw infectious disease, Dr. Earlene Plater on 1/24 and plan is for PICC line on 2/5 with cefepime. For now he is taking levofloxacin. He has been cleared by pulmonology for HBO. Due to extensive heart history we have asked HBO clearance from his cardiologist as well. He has follow-up with them tomorrow. He currently has no issues or complaints today. 2/6; patient presents for follow-up. He started HBO therapy today and had no issues with his dive. He had an arteriogram on 2/1 by Dr. Chestine [name]. He had angioplasty of the left peroneal artery. He is optimized from [name] vascular standpoint. We have been using Grafix to the right lower extremity wound and Dakin's wet-to-dry dressings to the left foot wound. He started his IV antibiotics yesterday. 2/13; patient presents for follow-up. We have been using Grafix to the right lower extremity wound under compression therapy and Dakin's wet-to-dry dressings to the left lateral wound. He is continued IV antibiotics without issues. 2/20; Patient presents for follow-up. We have been using Grafix to the right lower extremity wound under compression and Dakin's wet-to-dry dressings to the left lateral foot wound. Per patient since he cannot do two courses of IV antibiotics daily plan is to switch to oral antibiotics. Patient has not done HBO since 2/15. Plan is to be evaluated by ophthalmology to assure there are no issues with his vision. 2/27; patient presents for follow-up. We have been using Grafix to the right lower extremity under compression therapy and Dakin's  wet-to-dry dressings to the left lateral foot wound. He has switched over to oral antibiotics for the left foot with chronic osteomyelitis. He is scheduled to see ophthalmology later this week to be cleared for HBO. 3/7; patient presents for follow-up. We have been using Hydrofera Blue under Kerlix/Coban to the right lower extremity. We have been using Dakin's wet-to-dry dressings to the left lateral foot. This wound has healed. 3/14; patient presents for follow-up. We have been using endoform with antibiotic ointment under Kerlix/Coban to the right lower extremity. His wound is healed. 02/23/2023 Patient was last seen 1 month ago. At that time as his wounds were healed. He presents today because he is concerned about the posterior right leg reopening. He had dried lymph fluid that has come off and now there is moisture to the skin. There is only [name] couple very small areas that have some skin breakdown but overall the area is still mostly epithelialized. He has been keeping the area covered. 5/6; see patient who has [name] reopened wound in the posterior distal right lower leg just above the heel area. He has severe venous insufficiency however only [name] small open area remains. We have been using silver alginate and  foam the open wound area has been making good progress. Electronic Signature(s) Signed: 03/15/2023 4:51:59 PM By: Baltazar Najjar MD Entered By: Baltazar Najjar on 03/15/2023 16:12:07 -------------------------------------------------------------------------------- Physical Exam Details Patient Name: Date of Service: [name] [facility] [name]. 03/15/2023 2:45 PM Medical Record Number: [id number] Patient Account Number: [id number] Date of Birth/Sex: Treating RN: [birth year] (72 y.o. Male) Primary Care Provider: Richarda [name] Other Clinician: Referring Provider: Treating Provider/Extender: [name] in Treatment: 19 Constitutional Patient is hypertensive.. Pulse regular  and within target range for patient.Marland Kitchen Respirations regular, non-labored and within target range.. Temperature is normal and within the target range for the patient.Marland Kitchen Appears in no distress. Cardiovascular Edema control is adequate. Notes Wound exam; right posterior lower leg. Small open area in the middle of this but this looks like it is progressing towards closure. The healed area of [name] much larger and more complicated wound is callused with [name] thick surface there is no palpable tenderness no erythema Electronic Signature(s) Signed: 03/15/2023 4:51:59 PM By: Baltazar Najjar MD [name], [name] ([id number]) By: Baltazar Najjar MD [phone].pdf Page 3 of 7 Signed: 03/15/2023 4:51:59 PM Entered By: Baltazar Najjar on 03/15/2023 16:13:28 -------------------------------------------------------------------------------- Physician Orders Details Patient Name: Date of Service: [name] [facility] [name]. 03/15/2023 2:45 PM Medical Record Number: [id number] Patient Account Number: [id number] Date of Birth/Sex: Treating RN: 08/28/1951 (71 y.o. Male) Brenton Grills Primary Care Provider: Richarda [name] Other Clinician: Referring Provider: Treating Provider/Extender: [name] in Treatment: 8 Verbal / Phone Orders: No Diagnosis Coding Follow-up Appointments ppointment in 2 weeks. - w/ Dr. Damita Dunnings # 9 Return [name] Anesthetic (In clinic) Topical Lidocaine 5% applied to wound bed Bathing/ Shower/ Hygiene May shower with protection but do not get wound dressing(s) wet. Protect dressing(s) with water repellant cover (for example, large plastic bag) or [name] cast cover and may then take shower. Wound Treatment Wound #4 - Achilles Wound Laterality: Right, Posterior Cleanser: Wound Cleanser (Generic) 1 x Per Day/15 Days Discharge Instructions: Cleanse the wound with wound cleanser prior to applying [name] clean dressing using gauze sponges, not tissue or cotton  balls. Peri-Wound Care: Skin Prep (Generic) 1 x Per Day/15 Days Discharge Instructions: Use skin prep as directed Prim Dressing: Maxorb Extra Ag+ Alginate Dressing, 4x4.75 (in/in) (Generic) 1 x Per Day/15 Days ary Discharge Instructions: Apply to wound bed as instructed Secondary Dressing: Woven Gauze Sponge, Non-Sterile 4x4 in (Generic) 1 x Per Day/15 Days Discharge Instructions: Apply over primary dressing as directed. Secondary Dressing: Zetuvit Plus Silicone Border Dressing 4x4 (in/in) (Generic) 1 x Per Day/15 Days Discharge Instructions: Apply silicone border over primary dressing as directed. Electronic Signature(s) Signed: 03/15/2023 4:51:59 PM By: Baltazar Najjar MD Signed: 04/20/2023 7:49:05 AM By: Brenton Grills Entered By: Brenton Grills on 03/15/2023 15:32:19 -------------------------------------------------------------------------------- Problem List Details Patient Name: Date of Service: [name] [facility] [name]. 03/15/2023 2:45 PM Medical Record Number: [id number] Patient Account Number: [id number] Date of Birth/Sex: Treating RN: 08-03-1951 (71 y.o. Male) Brenton Grills Primary Care Provider: Richarda [name] Other Clinician: Referring Provider: Treating Provider/Extender: [name] in Treatment: [address] 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 07/23/2022 No Yes muscle E11.622 Type 2 diabetes mellitus with other skin ulcer 02/23/2023 No Yes I73.9 Peripheral vascular disease, unspecified 07/16/2022 No Yes Inactive Problems Resolved Problems ICD-10 Code Description Active  Date Resolved Date L97.518 Non-pressure chronic ulcer of other part of right foot with other specified severity 07/16/2022 07/16/2022 L97.522 Non-pressure chronic ulcer of other part of left  foot with fat layer exposed 07/16/2022 07/16/2022 Z61.096 Type 2 diabetes mellitus with foot ulcer 07/16/2022 07/16/2022 E45.409 Other chronic osteomyelitis, left ankle and foot 11/10/2022 11/10/2022 Electronic Signature(s) Signed: 03/15/2023 4:51:59 PM By: Baltazar Najjar MD Entered By: Baltazar Najjar on 03/15/2023 16:10:20 -------------------------------------------------------------------------------- Progress Note Details Patient Name: Date of Service: [name] [name]. 03/15/2023 2:45 PM Medical Record Number: [id number] Patient Account Number: [id number] Date of Birth/Sex: Treating RN: August 14, 1951 (73 y.o. Male) Primary Care Provider: Richarda [name] Other Clinician: Referring Provider: Treating Provider/Extender: [name] in Treatment: 34 Subjective History of Present Illness (HPI) Admission 07/16/2022 Mr. 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 He is not sure how the wounds started. He has been following with podiatry for this issue. He had removal of the tendon to the right lower extremity by Dr. Ralene [name]. He has been using Betadine to the wound beds. On 06/11/2022 he had [name] right posterior tibial artery angioplasty by Dr. Chestine [name]. It was reported the patient is optimized after revascularization of the right lower extremity. His ABIs on the left were 0.91. He currently denies systemic signs of infection. He also does not wear shoes. He came in with Kerlix wrap to his feet bilaterally. This did not completely cover his feet. 07/23/2022: This is [name] patient of Dr. Neita [name] that she asked me to take [name] look at  last week due to the significant involvement of the muscle and tendon on his right posterior leg wound. He needed an aggressive debridement and asked me if I would be able to perform this on her behalf. The patient is here today for that procedure. When his dressing was removed in clinic today, the wound was teeming with maggots. There is necrotic muscle, tendon, and fat, with [name] thick layer of slough on the wound. 9/21; patient presents for follow-up. He was debrided by Dr. Lady [name] at last clinic visit without any issues. He has been placing Dakin's wet-to-dry dressings to the right posterior wound. He has been using Medihoney to the left foot wound. He reports improvement in wound healing. He has no issues or complaints today. [name], [name] ([id number]) [id number] Page 5 of 7 9/28; patient with type 2 diabetes PAD status post revascularization. He underwent retrograde right PTA angioplasty. Last saw Dr. Chestine [name] on 9/12 and was felt to have [name] brisk posterior tibial signal at the right ankle. He had [name] pulsatile toe tracing that appeared adequate for healing. He has been using Santyl and Hydrofera Blue on the right Achilles area.  He has [name] smaller area on the left fifth plantar metatarsal head They were apparently in the ER on 9/23 with swelling and discoloration above the wrap. They have [name] picture of the leg after the wrap was taken off which looks like that it was excessively tight superiorly 10/6; patient presents for follow-up. We have been using Santyl and Hydrofera Blue to the right lower extremity wound under Kerlix/Coban. T the left foot we o have been using silver  alginate with Medihoney. He again comes in with no shoes. He has no issues or complaints today. 10/12; patient presents for follow-up. We have been using Santyl and Hydrofera Blue to the right lower extremity under Kerlix/Coban And silver alginate to the left foot wound. He has no issues today. He  denies signs of infection. 10/19; patient presents for follow-up. We continue to use Santyl and Hydrofera Blue to the right lower extremity under Kerlix/Coban and silver alginate to the left foot wound. There is been improvement in wound healing. 10/26; patient presents for follow-up. We have been using Santyl and Hydrofera Blue to the right lower extremity under Kerlix/Coban and silver alginate to the left foot. There continues to be improvement in wound healing. Patient has no issues or complaints today. 11/2; the patient's area on the right Achilles heel looks quite healthy and is improved in terms of measurements. We have been using Hydrofera Blue. He is approved for Grafix On the left plantar foot wound over the fifth metatarsal head this tunnels over the lateral part of the met head. He is not offloading this at all 11/16; patient presents for follow-up. He has been using [name] surgical shoe with an offloading felt pad to the left lateral foot along with silver alginate. He has been approved for Grafix and this is available for placement today T the right lower extremity wound. Patient Is agreeable to this. We have been using Santyl and o Hydrofera Blue under Kerlix/Coban to this area. 11/21; patient presents for follow-up. We have been doing Grafix to the right lower extremity and silver alginate to the left foot wound. He has no issues or complaints today. 11/30; patient presents for follow-up. We have been doing Grafix to the right lower extremity wound and Medihoney with Hydrofera Blue to the left lateral foot wound. He reports pain to the left foot wound today. He did not obtain his x-ray. 12/11; patient presents for follow-up. He has been using Dakin's wet-to-dry dressings to the left plantar foot wound. We have been doing Grafix to the right lower extremity wound under compression therapy. He obtained his x-ray that showed mild periosteal elevation at the resection site with the possibility  of osteomyelitis. He currently denies systemic signs of infection. He has been taking doxycycline and Augmentin without issues. He is getting his INR checked by his primary care office. 12/18; patient presents for follow-up. He has been using Dakin's wet-to-dry dressings to the left foot wound. We have been placing Grafix under compression therapy to the right lower extremity. He is currently taking doxycycline and Augmentin. He denies signs of infection. 12/26; patient presents for follow-up. He has been using Dakin's wet-to-dry dressings to the left foot. He has been off oral antibiotics for potential bone biopsy. We have been placing Grafix under compression therapy to the right lower extremity. There has been improvement in wound healing to both sites. He states he is trying to aggressively offload the left foot. He currently denies signs of infection. 1/2; patient presents for follow-up. He has been doing Dakin's wet-to-dry dressings to the left foot. He saw Dr. Ralene [name] today and had [name] bone biopsy. Debridement was performed on the site. Dressing in place with Dakin's wet-to-dry. T the right leg we have been using Grafix under compression therapy. He o has no issues or complaints today. 1/9; patient presents for follow-up. We have been using Hydrofera Blue under compression therapy to the right lower extremity wound. This is well-healing. He has been using Dakin's wet-to-dry  dressings to the left lateral foot wound. He has been taking ciprofloxacin due to culture results without issues. He was referred to infectious disease by podiatry. He does not have an appointment yet. 1/16; patient presents for follow-up. We were using collagen under Kerlix/Coban to the right lower extremity wound. He has been approved for Grafix and this was available for placement today. He continues to use Dakin's wet-to-dry dressings daily to the left foot. He started levofloxacin. He has an appointment with infectious  disease on 1/24. 1/23; patient presents for follow-up. Grafix was placed in standard fashion to the right lower extremity wound last week. He has been using Dakin's wet-to-dry dressings to the left foot. He has been taking levofloxacin. He has an infectious disease appointment tomorrow as well as [name] pulmonology appointment. He followed up with vein and vascular on 1/16 and plan is for angiogram of the left lower extremity. He has [name] history of left peroneal angioplasty in 2022. 1/30; patient presents for follow-up. Grafix was placed in standard fashion to the right lower extremity wound last week. There has been improvement in healing here. He has been using Dakin's wet-to-dry dressings to the left foot. Patient saw infectious disease, Dr. Earlene Plater on 1/24 and plan is for PICC line on 2/5 with cefepime. For now he is taking levofloxacin. He has been cleared by pulmonology for HBO. Due to extensive heart history we have asked HBO clearance from his cardiologist as well. He has follow-up with them tomorrow. He currently has no issues or complaints today. 2/6; patient presents for follow-up. He started HBO therapy today and had no issues with his dive. He had an arteriogram on 2/1 by Dr. Chestine [name]. He had angioplasty of the left peroneal artery. He is optimized from [name] vascular standpoint. We have been using Grafix to the right lower extremity wound and Dakin's wet-to-dry dressings to the left foot wound. He started his IV antibiotics yesterday. 2/13; patient presents for follow-up. We have been using Grafix to the right lower extremity wound under compression therapy and Dakin's wet-to-dry dressings to the left lateral wound. He is continued IV antibiotics without issues. 2/20; Patient presents for follow-up. We have been using Grafix to the right lower extremity wound under compression and Dakin's wet-to-dry dressings to the left lateral foot wound. Per patient since he cannot do two courses of IV antibiotics  daily plan is to switch to oral antibiotics. Patient has not done HBO since 2/15. Plan is to be evaluated by ophthalmology to assure there are no issues with his vision. 2/27; patient presents for follow-up. We have been using Grafix to the right lower extremity under compression therapy and Dakin's wet-to-dry dressings to the left lateral foot wound. He has switched over to oral antibiotics for the left foot with chronic osteomyelitis. He is scheduled to see ophthalmology later this week to be cleared for HBO. 3/7; patient presents for follow-up. We have been using Hydrofera Blue under Kerlix/Coban to the right lower extremity. We have been using Dakin's wet-to-dry dressings to the left lateral foot. This wound has healed. 3/14; patient presents for follow-up. We have been using endoform with antibiotic ointment under Kerlix/Coban to the right lower extremity. His wound is healed. 02/23/2023 Patient was last seen 1 month ago. At that time as his wounds were healed. He presents today because he is concerned about the posterior right leg reopening. He had dried lymph fluid that has come off and now there is moisture to the skin. There is only [name]  couple very small areas that have some skin breakdown but [name], [name] ([id number]) [id number] Page 6 of 7 overall the area is still mostly epithelialized. He has been keeping the area covered. 5/6; see patient who has [name] reopened wound in the posterior distal right lower leg just above the heel area. He has severe venous insufficiency however only [name] small open area remains. We have been using silver alginate and foam the open wound area has been making good progress. Objective Constitutional Patient is hypertensive.. Pulse regular and within target range for patient.Marland Kitchen Respirations regular, non-labored and within target range.. Temperature is normal and within the target range for the patient.Marland Kitchen Appears in no distress. Vitals  Time Taken: 3:14 PM, Height: 68 in, Weight: 185 lbs, BMI: 28.1, Temperature: 97.7 F, Pulse: 120 bpm, Respiratory Rate: 18 breaths/min, Blood Pressure: 159/94 mmHg. Cardiovascular Edema control is adequate. General Notes: Wound exam; right posterior lower leg. Small open area in the middle of this but this looks like it is progressing towards closure. The healed area of [name] much larger and more complicated wound is callused with [name] thick surface there is no palpable tenderness no erythema Integumentary (Hair, Skin) Wound #4 status is Open. Original cause of wound was Gradually Appeared. The date acquired was: 02/23/2023. The wound has been in treatment 2 weeks. The wound is located on the Right,Posterior Achilles. The wound measures 1cm length x 0.2cm width x 0.1cm depth; 0.157cm^2 area and 0.016cm^3 volume. There is no tunneling or undermining noted. There is [name] medium amount of serosanguineous drainage noted. The wound margin is distinct with the outline attached to the wound base. There is large (67-100%) red, pink granulation within the wound bed. There is no necrotic tissue within the wound bed. The periwound skin appearance did not exhibit: Callus, Crepitus, Excoriation, Induration, Rash, Scarring, Dry/Scaly, Maceration, Atrophie Blanche, Cyanosis, Ecchymosis, Hemosiderin Staining, Mottled, Pallor, Rubor, Erythema. Periwound temperature was noted as No Abnormality. The periwound has tenderness on palpation. 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 - w/ Dr. Mikey Bussing Rm # 9 Anesthetic: (In clinic) Topical Lidocaine 5% applied to wound bed Bathing/ Shower/ Hygiene: May shower with protection but do not get wound dressing(s) wet. Protect dressing(s) with water repellant cover  (for example, large plastic bag) or [name] cast cover and may then take shower. WOUND #4: - Achilles Wound Laterality: Right, Posterior Cleanser: Wound Cleanser (Generic) 1 x Per Day/15 Days Discharge Instructions: Cleanse the wound with wound cleanser prior to applying [name] clean dressing using gauze sponges, not tissue or cotton balls. Peri-Wound Care: Skin Prep (Generic) 1 x Per Day/15 Days Discharge Instructions: Use skin prep as directed Prim Dressing: Maxorb Extra Ag+ Alginate Dressing, 4x4.75 (in/in) (Generic) 1 x Per Day/15 Days ary Discharge Instructions: Apply to wound bed as instructed Secondary Dressing: Woven Gauze Sponge, Non-Sterile 4x4 in (Generic) 1 x Per Day/15 Days Discharge Instructions: Apply over primary dressing as directed. Secondary Dressing: Zetuvit Plus Silicone Border Dressing 4x4 (in/in) (Generic) 1 x Per Day/15 Days Discharge Instructions: Apply silicone border over primary dressing as directed. 1. Right posterior leg [name] reopening of [name] much more complicated wound that we eventually closed through most of this year. I have continued with the silver alginate and foam. I have asked his daughter to use skin moisturizer for the dry peeling skin in  the bilateral lower legs. 2. I had some concern about the thick callus like material that is over the majority of this open wound however in discussion with our intake nurse it appears that his this has been previously debrided there is skin underneath this which is reassuring Electronic Signature(s) [name], [name] ([id number]) [id number] Page 7 of 7 Signed: 05/10/2023 2:21:46 PM By: Pearletha Alfred Signed: 05/11/2023 3:36:09 PM By: Baltazar Najjar MD Previous Signature: 03/15/2023 4:51:59 PM Version By: Baltazar Najjar MD Entered By: Pearletha Alfred on 05/10/2023 14:21:46 -------------------------------------------------------------------------------- SuperBill Details Patient Name: Date of Service: [name] [facility] [name]. 03/15/2023 Medical Record Number: [id number] Patient Account Number: [id number] Date of Birth/Sex: Treating RN: 25-Oct-1951 (71 y.o. 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 E11.622 Type 2 diabetes mellitus with other skin ulcer I73.9 Peripheral vascular disease, unspecified Facility Procedures : CPT4 Code: [id number] Description: 99213 - WOUND CARE VISIT-LEV 3 EST PT Modifier: 25 Quantity: 1 Physician Procedures : CPT4 Code Description Modifier [id number] 99213 - WC PHYS LEVEL 3 - EST PT ICD-10 Diagnosis Description 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

## 2023-05-12 ENCOUNTER — Telehealth: Payer: Self-pay

## 2023-05-12 NOTE — Telephone Encounter (Signed)
Spoke with patient's daughter and informed her of recommendation she verbalized her understanding and agreed.

## 2023-05-12 NOTE — Telephone Encounter (Signed)
Yes would rcommend taking benefiber with drink of choice twice daily to see if this help bulk up his stools.

## 2023-05-12 NOTE — Telephone Encounter (Signed)
Patient's daughter called stating that patient has been having bowel incontinence for about a week. She sates that patient has been eating healthier. He has a bowel movement a few times daily or every other day sometime not making it to the bathroom. Patient stated that he wanted to wait a couple of days before he came into the office to see if things get better,but appointment was made for next week. She would like to know if there is anything that he can take in the meantime. Please advise.  Message sent to Abbey Chatters, NP

## 2023-05-17 ENCOUNTER — Ambulatory Visit: Payer: Medicare HMO | Attending: Cardiology

## 2023-05-17 DIAGNOSIS — Z5181 Encounter for therapeutic drug level monitoring: Secondary | ICD-10-CM

## 2023-05-17 DIAGNOSIS — I513 Intracardiac thrombosis, not elsewhere classified: Secondary | ICD-10-CM

## 2023-05-17 DIAGNOSIS — I4891 Unspecified atrial fibrillation: Secondary | ICD-10-CM

## 2023-05-17 LAB — POCT INR: INR: 3.6 — AB (ref 2.0–3.0)

## 2023-05-17 NOTE — Patient Instructions (Signed)
HOLD TODAY ONLY THEN Continue taking warfarin 1/2 tablet daily except for 1 tablet on Mondays, Wednesdays, and Fridays. Recheck INR 3 weeks.  [facility] [phone].

## 2023-05-19 ENCOUNTER — Encounter: Payer: Self-pay | Admitting: Adult Health

## 2023-05-19 ENCOUNTER — Encounter: Payer: Medicare HMO | Admitting: Adult Health

## 2023-05-20 ENCOUNTER — Encounter: Payer: Self-pay | Admitting: Adult Health

## 2023-05-20 ENCOUNTER — Ambulatory Visit (INDEPENDENT_AMBULATORY_CARE_PROVIDER_SITE_OTHER): Payer: Medicare HMO | Admitting: Adult Health

## 2023-05-20 ENCOUNTER — Encounter (HOSPITAL_BASED_OUTPATIENT_CLINIC_OR_DEPARTMENT_OTHER): Payer: Medicare HMO | Attending: Internal Medicine | Admitting: Internal Medicine

## 2023-05-20 VITALS — BP 128/78 | HR 110 | Temp 97.8°F | Resp 18 | Ht 68.0 in | Wt 190.0 lb

## 2023-05-20 DIAGNOSIS — N1832 Chronic kidney disease, stage 3b: Secondary | ICD-10-CM

## 2023-05-20 DIAGNOSIS — E11621 Type 2 diabetes mellitus with foot ulcer: Secondary | ICD-10-CM | POA: Insufficient documentation

## 2023-05-20 DIAGNOSIS — I4891 Unspecified atrial fibrillation: Secondary | ICD-10-CM | POA: Diagnosis not present

## 2023-05-20 DIAGNOSIS — M86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, left ankle and foot: Secondary | ICD-10-CM | POA: Diagnosis not present

## 2023-05-20 DIAGNOSIS — E11622 Type 2 diabetes mellitus with other skin ulcer: Secondary | ICD-10-CM | POA: Insufficient documentation

## 2023-05-20 DIAGNOSIS — I5022 Chronic systolic (congestive) heart failure: Secondary | ICD-10-CM

## 2023-05-20 DIAGNOSIS — E1151 Type 2 diabetes mellitus with diabetic peripheral angiopathy without gangrene: Secondary | ICD-10-CM | POA: Diagnosis not present

## 2023-05-20 DIAGNOSIS — I110 Hypertensive heart disease with heart failure: Secondary | ICD-10-CM | POA: Insufficient documentation

## 2023-05-20 DIAGNOSIS — I872 Venous insufficiency (chronic) (peripheral): Secondary | ICD-10-CM | POA: Diagnosis not present

## 2023-05-20 DIAGNOSIS — I739 Peripheral vascular disease, unspecified: Secondary | ICD-10-CM | POA: Diagnosis not present

## 2023-05-20 DIAGNOSIS — L97813 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: Secondary | ICD-10-CM

## 2023-05-20 DIAGNOSIS — E1122 Type 2 diabetes mellitus with diabetic chronic kidney disease: Secondary | ICD-10-CM | POA: Diagnosis not present

## 2023-05-20 DIAGNOSIS — G4733 Obstructive sleep apnea (adult) (pediatric): Secondary | ICD-10-CM | POA: Insufficient documentation

## 2023-05-20 DIAGNOSIS — E1140 Type 2 diabetes mellitus with diabetic neuropathy, unspecified: Secondary | ICD-10-CM | POA: Diagnosis not present

## 2023-05-20 DIAGNOSIS — F5101 Primary insomnia: Secondary | ICD-10-CM | POA: Diagnosis not present

## 2023-05-20 DIAGNOSIS — Z794 Long term (current) use of insulin: Secondary | ICD-10-CM

## 2023-05-20 MED ORDER — ONETOUCH ULTRA MINI W/DEVICE KIT
1.0000 | PACK | Freq: Three times a day (TID) | 0 refills | Status: DC
Start: 2023-05-20 — End: 2023-06-02

## 2023-05-20 MED ORDER — ONETOUCH ULTRASOFT LANCETS MISC
12 refills | Status: DC
Start: 2023-05-20 — End: 2023-06-02

## 2023-05-20 MED ORDER — ONETOUCH ULTRA VI STRP
ORAL_STRIP | 12 refills | Status: DC
Start: 2023-05-20 — End: 2023-06-02

## 2023-05-20 NOTE — Progress Notes (Signed)
[facility] clinic  Provider:  Kenard Gower DNP  Code Status: Full Code  Goals of Care:     05/20/2023    1:56 PM  Advanced Directives  Does Patient Have a Medical Advance Directive? No  Would patient like information on creating a medical advance directive? No - Patient declined     Chief Complaint  Patient presents with   Acute Visit    Medication assessment, bad reaction to medication, not sleeping    HPI: Patient is a 72 y.o. male seen today for an acute visit for medication assessment. He was accompanied by his daughter and granddaughter. Daughter stated that he sleeps all the time in the morning and awake at night. He refuses to take antidepressant. He has limited vision on both eyes making him steady. He lives alone and daughter visits him daily. Daughter recently had a car accident and was hospitalized. Daughter asked a friend to take care of the medications which was messed up per daughter. Daughter stated that medication is now back to normal since she now handles them.  At the clinic, he talks about "people making money out of them because medical can".  Talks about people making money out of them because medical can  Past Medical History:  Diagnosis Date   A-fib [facility])    a. (06/04/14) TEE-DC-CV; succesful; large LA 6.2 cm   ADHD    Ascites    Athlete's foot    Chronic systolic heart failure (HCC)    a. 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, unspecified CKD stage, unspecified whether long term insulin use (HCC)    Heart murmur    HTN (hypertension)    OSA (obstructive sleep apnea)    PVD (peripheral vascular disease) (HCC)    s/p great toe amputation    Past Surgical History:  Procedure Laterality Date   ABDOMINAL AORTOGRAM W/LOWER EXTREMITY N/A  05/19/2021   Procedure: ABDOMINAL AORTOGRAM W/LOWER EXTREMITY;  Surgeon: Cephus Shelling, MD;  Location: [facility] INVASIVE [facility] LAB;  Service: Cardiovascular;  Laterality: N/A;   ABDOMINAL AORTOGRAM W/LOWER EXTREMITY Right 06/11/2022   Procedure: ABDOMINAL AORTOGRAM W/LOWER EXTREMITY;  Surgeon: Cephus Shelling, MD;  Location: [facility] INVASIVE CV LAB;  Service: Cardiovascular;  Laterality: Right;   ABDOMINAL AORTOGRAM W/LOWER EXTREMITY Left 12/10/2022   Procedure: ABDOMINAL AORTOGRAM W/LOWER EXTREMITY;  Surgeon: Cephus Shelling, MD;  Location: [facility] INVASIVE CV LAB;  Service: Cardiovascular;  Laterality: Left;   AMPUTATION Left 05/22/2021   Procedure: LEFT FIFTH TOE AMPUTATION;  Surgeon: Cephus Shelling, MD;  Location: [facility] OR;  Service: Vascular;  Laterality: Left;   CARDIOVERSION N/A 06/04/2014   Procedure: CARDIOVERSION;  Surgeon: Laurey Morale, MD;  Location: [facility] ENDOSCOPY;  Service: Cardiovascular;  Laterality: N/A;   CARDIOVERSION N/A 11/06/2021   Procedure: CARDIOVERSION;  Surgeon: Laurey Morale, MD;  Location: [facility] ENDOSCOPY;  Service: Cardiovascular;  Laterality: N/A;   NOSE SURGERY     Nasal septum surgery   PERIPHERAL VASCULAR BALLOON ANGIOPLASTY  06/11/2022   Procedure: PERIPHERAL VASCULAR BALLOON ANGIOPLASTY;  Surgeon: Cephus Shelling, MD;  Location: [facility];  Service: Cardiovascular;;   PERIPHERAL VASCULAR BALLOON ANGIOPLASTY Left 12/10/2022   Procedure: PERIPHERAL VASCULAR BALLOON ANGIOPLASTY;  Surgeon: Cephus Shelling, MD;  Location: [facility];  Service: Cardiovascular;  Laterality: Left;  Peroneal   PERIPHERAL VASCULAR INTERVENTION Left 05/19/2021   Procedure: PERIPHERAL VASCULAR INTERVENTION;  Surgeon: Cephus Shelling, MD;  Location: [facility];  Service: Cardiovascular;  Laterality: Left;   TEE WITHOUT CARDIOVERSION N/A 06/04/2014   Procedure: TRANSESOPHAGEAL ECHOCARDIOGRAM (TEE);  Surgeon: Laurey Morale, MD;  Location: [facility] ENDOSCOPY;   Service: Cardiovascular;  Laterality: N/A;   TEE WITHOUT CARDIOVERSION N/A 01/06/2016   Procedure: TRANSESOPHAGEAL ECHOCARDIOGRAM (TEE);  Surgeon: Pricilla Riffle, MD;  Location: [facility] ENDOSCOPY;  Service: Cardiovascular;  Laterality: N/A;   TEE WITHOUT CARDIOVERSION N/A 11/06/2021   Procedure: TRANSESOPHAGEAL ECHOCARDIOGRAM (TEE);  Surgeon: Laurey Morale, MD;  Location: [facility] ENDOSCOPY;  Service: Cardiovascular;  Laterality: N/A;   VASECTOMY      No Known Allergies  Outpatient Encounter Medications as of 05/20/2023  Medication Sig   APPLE CIDER VINEGAR PO Take 30 mLs by mouth 4 (four) times a week.   blood glucose meter kit and supplies Dispense based on patient and insurance preference. Use up to four times daily as directed. (FOR ICD-10 E10.9, E11.9).   clopidogrel (PLAVIX) 75 MG tablet Take 1 tablet (75 mg total) by mouth daily.   diclofenac Sodium (VOLTAREN) 1 % GEL Apply 1 Application topically 4 (four) times daily as needed (pain).   ENTRESTO 49-51 MG TAKE 1 TABLET BY MOUTH TWICE DAILY   furosemide (LASIX) 20 MG tablet Take 1.5 tablets (30 mg total) by mouth 2 (two) times daily. Patient takes 1.5 tablet in the morning and 1.5 tablet at night.   insulin glargine (LANTUS) 100 UNIT/ML injection INJECT 8 UNITS UNDER THE SKIN DAILY   Insulin Syringe-Needle U-100 (TRUEPLUS INSULIN SYRINGE) 30G X 5/16" 1 ML MISC USE ONCE DAILY FOR INSULIN INJECTIONS.   JARDIANCE 10 MG TABS tablet TAKE 1 TABLET(10 MG) BY MOUTH DAILY   metoprolol succinate (TOPROL XL) 50 MG 24 hr tablet Take 1 tablet (50 mg total) by mouth daily.   metoprolol succinate (TOPROL-XL) 25 MG 24 hr tablet Take 1 tablet (25 mg total) by mouth at bedtime.   Misc Natural Products (JOINT SUPPORT PO) Take 1 Dose by mouth 3 (three) times a week.   spironolactone (ALDACTONE) 25 MG tablet Take 1 tablet (25 mg total) by mouth daily.   warfarin (COUMADIN) 6 MG tablet Take 0.5-1 tablets (3-6 mg total) by mouth See admin instructions. Take 6 mg on Mon  and Fri Take 3 mg on Sun, Tues, Wed, Thurs, and Sat   [DISCONTINUED] Blood Glucose Monitoring Suppl (ONE TOUCH ULTRA MINI) w/Device KIT 1 each by Does not apply route in the morning, at noon, and at bedtime.   [DISCONTINUED] glucose blood (ONETOUCH ULTRA) test strip Use as instructed to test up to three times daily.   [DISCONTINUED] Lancets (ONETOUCH ULTRASOFT) lancets Use as instructed to check blood sugar up to 3 times daily.   Blood Glucose Monitoring Suppl (ONE TOUCH ULTRA MINI) w/Device KIT 1 each by Does not apply route in the morning, at noon, and at bedtime.   glucose blood (ONETOUCH ULTRA) test strip Use as instructed to test up to three times daily.   Lancets (ONETOUCH ULTRASOFT) lancets Use as instructed to check blood sugar up to 3 times daily.   No facility-administered encounter medications on file as of 05/20/2023.    Review of Systems:  Review of Systems  Constitutional:  Negative for activity change, appetite change and fever.  HENT:  Negative for sore throat.   Eyes: Negative.  Barely sees on both eyes  Cardiovascular:  Negative for chest pain and leg swelling.  Gastrointestinal:  Negative for abdominal distention, diarrhea and vomiting.  Genitourinary:  Negative for dysuria, frequency and urgency.  Skin:  Negative for color change.  Neurological:  Negative for dizziness and headaches.  Psychiatric/Behavioral:  Negative for behavioral problems and sleep disturbance. The patient is not nervous/anxious.     Health Maintenance  Topic Date Due   Pneumonia Vaccine 58+ Years old (1 of 2 - PCV) Never done   FOOT EXAM  Never done   OPHTHALMOLOGY EXAM  08/10/2023 (Originally 09/21/1961)   INFLUENZA VACCINE  06/10/2023   HEMOGLOBIN A1C  06/30/2023   DTaP/Tdap/Td (2 - Td or Tdap) 05/01/2026   Hepatitis C Screening  Completed   HPV VACCINES  Aged Out   Colonoscopy  Discontinued   COVID-19 Vaccine  Discontinued   Zoster Vaccines- Shingrix  Discontinued    Physical  Exam: Vitals:   05/20/23 1354  BP: 128/78  Pulse: (!) 110  Resp: 18  Temp: 97.8 F (36.6 C)  TempSrc: Temporal  SpO2: 99%  Weight: 190 lb (86.2 kg)  Height: 5\' 8"  (1.727 m)   Body mass index is 28.89 kg/m. Physical Exam Constitutional:      Appearance: Normal appearance.  HENT:     Head: Normocephalic and atraumatic.     Mouth/Throat:     Mouth: Mucous membranes are moist.  Eyes:     Conjunctiva/sclera: Conjunctivae normal.     Comments: Limited vision on both eyes.  Cardiovascular:     Rate and Rhythm: Normal rate. Rhythm irregular.     Pulses: Normal pulses.     Heart sounds: Normal heart sounds.  Pulmonary:     Effort: Pulmonary effort is normal.     Breath sounds: Normal breath sounds.  Abdominal:     General: Bowel sounds are normal.     Palpations: Abdomen is soft.  Musculoskeletal:        General: No swelling. Normal range of motion.     Cervical back: Normal range of motion.  Skin:    General: Skin is warm and dry.     Comments: Right heel with chronic wound.  Neurological:     General: No focal deficit present.     Mental Status: He is alert and oriented to person, place, and time.  Psychiatric:        Mood and Affect: Mood normal.        Behavior: Behavior normal.        Thought Content: Thought content normal.        Judgment: Judgment normal.    Labs reviewed: Basic Metabolic Panel: Recent Labs    06/18/22 1029 12/02/22 1419 12/30/22 1353 01/13/23 0950 02/10/23 1044 02/18/23 1139 04/08/23 1157  NA 140   < > 143   < > 137 140 141  K 4.5   < > 4.1   < > 4.6 4.8 4.2  CL 105   < > 106   < > 102 105 104  CO2 24   < > 30   < > 25 28 26   GLUCOSE 124*   < > 161*   < > 141* 117* 136*  BUN 32*   < > 27*   < > 42* 39* 36*  CREATININE 1.91*   < > 1.41*   < > 1.87* 1.75* 1.73*  CALCIUM 9.0   < > 9.1   < > 9.3 9.4 9.4  TSH  1.93  --  1.74  --   --   --   --    < > = values in this interval not displayed.   Liver Function Tests: Recent Labs     06/18/22 1029 12/02/22 1419 12/30/22 1353  AST 17 14 18   ALT 22 16 20   BILITOT 0.5 0.7 0.4  PROT 7.6 7.3 7.7   No results for input(s): "LIPASE", "AMYLASE" in the last 8760 hours. No results for input(s): "AMMONIA" in the last 8760 hours. CBC: Recent Labs    06/18/22 1029 12/02/22 1419 12/10/22 0627 02/18/23 1139  WBC 9.3 7.4  --  6.7  NEUTROABS 7,133  --   --  4,523  HGB 13.1* 12.7* 14.3 13.0*  HCT 39.7 37.9* 42.0 39.1  MCV 87.3 88.3  --  90.5  PLT 222 161  --  160   Lipid Panel: Recent Labs    06/18/22 1029 12/30/22 1353  CHOL 168 162  HDL 37* 39*  LDLCALC 111* 97  TRIG 96 166*  CHOLHDL 4.5 4.2   Lab Results  Component Value Date   HGBA1C 6.5 (H) 12/30/2022    Procedures since last visit: No results found.  Assessment/Plan  1. Primary insomnia -  instructed to keep busy with activities in the morning to keep himself busy so he can sleep better at night  2. Type 2 diabetes mellitus with stage 3b chronic kidney disease, with long-term current use of insulin (HCC) -  check CBG daily, log and bring to next appointment - Lancets (ONETOUCH ULTRASOFT) lancets; Use as instructed to check blood sugar up to 3 times daily.  Dispense: 100 each; Refill: 12 - glucose blood (ONETOUCH ULTRA) test strip; Use as instructed to test up to three times daily.  Dispense: 100 each; Refill: 12 - Blood Glucose Monitoring Suppl (ONE TOUCH ULTRA MINI) w/Device KIT; 1 each by Does not apply route in the morning, at noon, and at bedtime.  Dispense: 1 kit; Refill: 0  3. PVD (peripheral vascular disease) (HCC) -  has chronic right heel wound and followed up at [facility]ound Clinic today -  continue Plavix  4. Atrial fibrillation with rapid ventricular response (HCC) -  rate-controlled -  continue Coumadin and Toprol XL  5. Chronic systolic heart failure (HCC) -  no shortness of breath, stable -  continue Entresto, Lasix, Jardiance and Spironolactone  6. Stage 3b chronic kidney disease  [facility]) Lab Results  Component Value Date   NA 141 04/08/2023   K 4.2 04/08/2023   CO2 26 04/08/2023   GLUCOSE 136 (H) 04/08/2023   BUN 36 (H) 04/08/2023   CREATININE 1.73 (H) 04/08/2023   CALCIUM 9.4 04/08/2023   EGFR 41 (L) 02/18/2023   GFRNONAA 41 (L) 04/08/2023      stable    Labs/tests ordered:   None   Next appt:  06/30/2023

## 2023-05-20 NOTE — Progress Notes (Signed)
This encounter was created in error - please disregard.

## 2023-05-21 NOTE — Progress Notes (Signed)
Darren [name] [name] ([id number]) [id number] Page 1 of 10 Visit Report for 05/20/2023 Chief Complaint Document Details Patient Name: Date of Service: [name] Darren [name] [facility] [name]. 05/20/2023 11:00 [name]: [id number] Patient Account Number: [id number] Date of Birth/Sex: Treating RN: 05-16-1951 (72 y.o. M) Primary Care Provider: Richarda Blade Other Clinician: Referring Provider: Treating Provider/Extender: Velna Ochs, [name] in Treatment: 53 Information Obtained from: Patient Chief Complaint 07/16/2022; bilateral lower extremity wounds Electronic Signature(s) Signed: 05/21/2023 9:53:20 AM By: Geralyn Corwin DO Entered By: Geralyn Corwin on 05/20/2023 12:00:46 -------------------------------------------------------------------------------- Debridement Details Patient Name: Date of Service: Darren [name] [facility] [name]. 05/20/2023 11:00 [name]: [id number] Patient Account Number: [id number] Date of Birth/Sex: Treating RN: 1951/01/04 (72 y.o. Darren [name] Primary Care Provider: Richarda Blade Other Clinician: Referring Provider: Treating Provider/Extender: Geralyn Corwin Ngetich, [name] in Treatment: 55 Debridement Performed for Assessment: Wound #4 Right,Posterior Achilles Performed By: Physician Geralyn Corwin, DO Debridement Type: Debridement Severity of Tissue Pre Debridement: Fat layer exposed Level of Consciousness (Pre-procedure): Awake and Alert Pre-procedure Verification/Time Out Yes - 11:52 Taken: Start Time: 11:53 Pain Control: Lidocaine 5% topical ointment Percent of Wound Bed Debrided: 100% T Area Debrided (cm): otal 0.12 Tissue and other material debrided: Non-Viable, Slough, Skin: Epidermis, Slough Level: Skin/Epidermis Debridement Description: Selective/Open Wound Instrument: Curette Bleeding: Minimum Hemostasis Achieved: Pressure Response to Treatment: Procedure was tolerated  well Level of Consciousness (Post- Awake and Alert procedure): Post Debridement Measurements of Total Wound Length: (cm) 0.5 Width: (cm) 0.3 Depth: (cm) 0.1 Volume: (cm) 0.012 Character of Wound/Ulcer Post Debridement: Requires Further Debridement Severity of Tissue Post Debridement: Fat layer exposed Post Procedure Diagnosis Darren [name], Darren [name] ([id number]) [id number].pdf Page 2 of 10 Same as Pre-procedure Notes Scribed for Dr. Mikey Bussing by Tommie Ard, RN Electronic Signature(s) Signed: 05/20/2023 2:48:02 PM By: Tommie Ard RN Signed: 05/21/2023 9:53:20 AM By: Geralyn Corwin DO Entered By: Tommie Ard on 05/20/2023 11:54:47 -------------------------------------------------------------------------------- HPI Details Patient Name: Date of Service: Darren [name] Darren [name]. 05/20/2023 11:00 [name]: [id number] Patient Account Number: [id number] Date of Birth/Sex: Treating RN: 08-11-1951 (72 y.o. M) Primary Care Provider: Richarda Blade Other Clinician: Referring Provider: Treating Provider/Extender: Velna Ochs, [name] in Treatment: 64 History of Present Illness HPI Description: Admission 07/16/2022 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 He is not sure how the wounds started. He has been following with podiatry for this issue. He had removal of the tendon to the right lower extremity by Dr. Ralene Cork. He has been using Betadine to the wound beds. On 06/11/2022 he had [name] right posterior tibial artery angioplasty by Dr. Chestine Spore. It was reported the patient is  optimized after revascularization of the right lower extremity. His ABIs on the left were 0.91. He currently denies systemic signs of infection. He also does not wear shoes. He came in with Kerlix wrap to his feet bilaterally. This did not completely cover his feet. 07/23/2022: This is [name] patient of Dr. Neita Garnet that she asked me to take [name] look at last week due to the significant involvement of the muscle and tendon on his right posterior leg  wound. He needed an aggressive debridement and asked me if I would be able to perform this on her behalf. The patient is here today for that procedure. When his dressing was removed in clinic today, the wound was teeming with maggots. There is necrotic muscle, tendon, and fat, with [name] thick layer of slough on the wound. 9/21; patient presents for follow-up. He was debrided by Dr. Lady Gary at last clinic visit without any issues. He has been placing Dakin's wet-to-dry dressings to the right posterior wound. He has been using Medihoney to the left foot wound. He reports improvement in wound healing. He has no issues or complaints today. 9/28; patient with type 2 diabetes PAD status post revascularization. He underwent retrograde right PTA angioplasty. Last saw Dr. Chestine Spore on 9/12 and was felt to have [name] brisk posterior tibial signal at the right ankle. He had [name] pulsatile toe tracing that appeared adequate for healing. He has been using Santyl and Hydrofera Blue on the right Achilles area. He has [name] smaller area on the left fifth plantar metatarsal head They were apparently in the ER on 9/23 with swelling and discoloration above the wrap. They have [name] picture of the leg after the wrap was taken off which looks like that it was excessively tight superiorly 10/6; patient presents for follow-up. We have been using Santyl and Hydrofera Blue to the right lower extremity wound under Kerlix/Coban. T the left foot we o have been using silver alginate with Medihoney. He again  comes in with no shoes. He has no issues or complaints today. 10/12; patient presents for follow-up. We have been using Santyl and Hydrofera Blue to the right lower extremity under Kerlix/Coban And silver alginate to the left foot wound. He has no issues today. He denies signs of infection. 10/19; patient presents for follow-up. We continue to use Santyl and Hydrofera Blue to the right lower extremity under Kerlix/Coban and silver alginate to the left foot wound. There is been improvement in wound healing. 10/26; patient presents for follow-up. We have been using Santyl and Hydrofera Blue to the right lower extremity under Kerlix/Coban and silver alginate to the left foot. There continues to be improvement in wound healing. Patient has no issues or complaints today. 11/2; the patient's area on the right Achilles heel looks quite healthy and is improved in terms of measurements. We have been using Hydrofera Blue. He is approved for Grafix On the left plantar foot wound over the fifth metatarsal head this tunnels over the lateral part of the met head. He is not offloading this at all 11/16; patient presents for follow-up. He has been using [name] surgical shoe with an offloading felt pad to the left lateral foot along with silver alginate. He has been approved for Grafix and this is available for placement today T the right lower extremity wound. Patient Is agreeable to this. We have been using Santyl and o Hydrofera Blue under Kerlix/Coban to this area. 11/21; patient presents for follow-up. We have been doing Grafix to the right lower extremity and silver alginate to the left foot wound. He has no issues or complaints today. 11/30; patient presents for follow-up. We have been doing Grafix to the right lower extremity wound and Medihoney with Hydrofera Blue to the left lateral foot wound. He reports pain to the left foot wound today. He did not obtain his x-ray. 12/11; patient presents for follow-up. He  has been using Dakin's wet-to-dry dressings to the left plantar foot wound. We have been  doing Grafix to the right Darren [name], Darren [name] ([id number]) [id number] Page 3 of 10 lower extremity wound under compression therapy. He obtained his x-ray that showed mild periosteal elevation at the resection site with the possibility of osteomyelitis. He currently denies systemic signs of infection. He has been taking doxycycline and Augmentin without issues. He is getting his INR checked by his primary care office. 12/18; patient presents for follow-up. He has been using Dakin's wet-to-dry dressings to the left foot wound. We have been placing Grafix under compression therapy to the right lower extremity. He is currently taking doxycycline and Augmentin. He denies signs of infection. 12/26; patient presents for follow-up. He has been using Dakin's wet-to-dry dressings to the left foot. He has been off oral antibiotics for potential bone biopsy. We have been placing Grafix under compression therapy to the right lower extremity. There has been improvement in wound healing to both sites. He states he is trying to aggressively offload the left foot. He currently denies signs of infection. 1/2; patient presents for follow-up. He has been doing Dakin's wet-to-dry dressings to the left foot. He saw Dr. Ralene Cork today and had [name] bone biopsy. Debridement was performed on the site. Dressing in place with Dakin's wet-to-dry. T the right leg we have been using Grafix under compression therapy. He o has no issues or complaints today. 1/9; patient presents for follow-up. We have been using Hydrofera Blue under compression therapy to the right lower extremity wound. This is well-healing. He has been using Dakin's wet-to-dry dressings to the left lateral foot wound. He has been taking ciprofloxacin due to culture results without issues. He was referred to infectious disease by podiatry. He does not have  an appointment yet. 1/16; patient presents for follow-up. We were using collagen under Kerlix/Coban to the right lower extremity wound. He has been approved for Grafix and this was available for placement today. He continues to use Dakin's wet-to-dry dressings daily to the left foot. He started levofloxacin. He has an appointment with infectious disease on 1/24. 1/23; patient presents for follow-up. Grafix was placed in standard fashion to the right lower extremity wound last week. He has been using Dakin's wet-to-dry dressings to the left foot. He has been taking levofloxacin. He has an infectious disease appointment tomorrow as well as [name] pulmonology appointment. He followed up with vein and vascular on 1/16 and plan is for angiogram of the left lower extremity. He has [name] history of left peroneal angioplasty in 2022. 1/30; patient presents for follow-up. Grafix was placed in standard fashion to the right lower extremity wound last week. There has been improvement in healing here. He has been using Dakin's wet-to-dry dressings to the left foot. Patient saw infectious disease, Dr. Earlene Plater on 1/24 and plan is for PICC line on 2/5 with cefepime. For now he is taking levofloxacin. He has been cleared by pulmonology for HBO. Due to extensive heart history we have asked HBO clearance from his cardiologist as well. He has follow-up with them tomorrow. He currently has no issues or complaints today. 2/6; patient presents for follow-up. He started HBO therapy today and had no issues with his dive. He had an arteriogram on 2/1 by Dr. Chestine Spore. He had angioplasty of the left peroneal artery. He is optimized from [name] vascular standpoint. We have been using Grafix to the right lower extremity wound and Dakin's wet-to-dry dressings to the left foot wound. He started his IV antibiotics yesterday. 2/13; patient presents for follow-up. We have been using Grafix  to the right lower extremity wound under compression therapy and  Dakin's wet-to-dry dressings to the left lateral wound. He is continued IV antibiotics without issues. 2/20; Patient presents for follow-up. We have been using Grafix to the right lower extremity wound under compression and Dakin's wet-to-dry dressings to the left lateral foot wound. Per patient since he cannot do two courses of IV antibiotics daily plan is to switch to oral antibiotics. Patient has not done HBO since 2/15. Plan is to be evaluated by ophthalmology to assure there are no issues with his vision. 2/27; patient presents for follow-up. We have been using Grafix to the right lower extremity under compression therapy and Dakin's wet-to-dry dressings to the left lateral foot wound. He has switched over to oral antibiotics for the left foot with chronic osteomyelitis. He is scheduled to see ophthalmology later this week to be cleared for HBO. 3/7; patient presents for follow-up. We have been using Hydrofera Blue under Kerlix/Coban to the right lower extremity. We have been using Dakin's wet-to-dry dressings to the left lateral foot. This wound has healed. 3/14; patient presents for follow-up. We have been using endoform with antibiotic ointment under Kerlix/Coban to the right lower extremity. His wound is healed. 02/23/2023 Patient was last seen 1 month ago. At that time as his wounds were healed. He presents today because he is concerned about the posterior right leg reopening. He had dried lymph fluid that has come off and now there is moisture to the skin. There is only [name] couple very small areas that have some skin breakdown but overall the area is still mostly epithelialized. He has been keeping the area covered. 5/6; see patient who has [name] reopened wound in the posterior distal right lower leg just above the heel area. He has severe venous insufficiency however only [name] small open area remains. We have been using silver alginate and foam the open wound area has been making good  progress. 5/20; patient presents for follow-up. He has been using silver alginate with foam border dressing to the right posterior leg wound along with his compression stocking. He has no issues or complaints today. Small area still remains open. 6/6; right Achilles wound. He has been using silver alginate [name] border foam and Tubigrip. Comes in today with callus around this wound area. They are also concerned about callus on the remanent of wound on the left plantar foot fifth met head 6/20; He has been using silver alginate to the Achilles wound. The wound is smaller today. Reexamined previous wound site on the left plantar foot fifth met head and this has remained closed. 7/11; patient presents for follow-up. He has been using silver alginate to the posterior right leg wound. Wound is just much smaller. He has no issues or complaints today. Electronic Signature(s) Signed: 05/21/2023 9:53:20 AM By: Geralyn Corwin DO Entered By: Geralyn Corwin on 05/20/2023 12:01:59 -------------------------------------------------------------------------------- Physical Exam Details Patient Name: Date of Service: Darren [name] Darren [name] Darren [name]. 05/20/2023 11:00 [name] Darren [name], Darren [name] ([id number]) [phone].pdf Page 4 of 10 Medical Record Number: [id number] Patient Account Number: [id number] Date of Birth/Sex: Treating RN: [birth year] (72 y.o. M) Primary Care Provider: Richarda Blade Other Clinician: Referring Provider: Treating Provider/Extender: Geralyn Corwin Ngetich, [name] in Treatment: 9 Constitutional respirations regular, non-labored and within target range for patient.. Cardiovascular 2+ dorsalis pedis/posterior tibialis pulses. Psychiatric pleasant and cooperative. Notes T the right Achilles area there is an open wound with granulation tissue, devitalized tissue and slough accumulation. No signs of  infection. o Electronic Signature(s) Signed: 05/21/2023 9:53:20 AM By:  Geralyn Corwin DO Entered By: Geralyn Corwin on 05/20/2023 12:06:33 -------------------------------------------------------------------------------- Physician Orders Details Patient Name: Date of Service: Darren [name] [facility] [name]. 05/20/2023 11:00 [name]: [id number] Patient Account Number: [id number] Date of Birth/Sex: Treating RN: 1950/12/22 (72 y.o. Darren [name] Primary Care Provider: Richarda Blade Other Clinician: Referring Provider: Treating Provider/Extender: Geralyn Corwin Ngetich, [name] in Treatment: 2 Verbal / Phone Orders: No Diagnosis Coding Follow-up Appointments ppointment in 2 weeks. - Dr. Mikey Bussing Room 9 Return [name] Anesthetic (In clinic) Topical Lidocaine 5% applied to wound bed Bathing/ Shower/ Hygiene May shower with protection but do not get wound dressing(s) wet. Protect dressing(s) with water repellant cover (for example, large plastic bag) or [name] cast cover and may then take shower. - Tubigrips-keep on during the daytime and take off at night Edema Control - Lymphedema / SCD / Other Bilateral Lower Extremities Avoid standing for long periods of time. Exercise regularly - As tolerated Moisturize legs daily. Off-Loading Other: - Place pillow behind calf when lying/sitting to float heels. Wear PEG Assist shoe Wound Treatment Wound #4 - Achilles Wound Laterality: Right, Posterior Cleanser: Wound Cleanser (Generic) 1 x Per Day/30 Days Discharge Instructions: Cleanse the wound with wound cleanser prior to applying [name] clean dressing using gauze sponges, not tissue or cotton balls. Peri-Wound Care: Skin Prep (Generic) 1 x Per Day/30 Days Discharge Instructions: Use skin prep as directed Prim Dressing: Maxorb Extra Ag+ Alginate Dressing, 4x4.75 (in/in) (Generic) 1 x Per Day/30 Days ary Discharge Instructions: Apply to wound bed as instructed Secondary Dressing: Woven Gauze Sponge, Non-Sterile 4x4 in (Generic) 1 x Per Day/30 Days Discharge  Instructions: Apply over primary dressing as directed. Darren [name], Darren [name] ([id number]) [id number] Page 5 of 10 Secondary Dressing: Zetuvit Plus Silicone Border Dressing 4x4 (in/in) (Generic) 1 x Per Day/30 Days Discharge Instructions: Apply silicone border over primary dressing as directed. Electronic Signature(s) Signed: 05/21/2023 9:53:20 AM By: Geralyn Corwin DO Previous Signature: 05/20/2023 11:45:26 AM Version By: Tommie Ard RN Entered By: Geralyn Corwin on 05/20/2023 12:06:44 -------------------------------------------------------------------------------- Problem List Details Patient Name: Date of Service: Darren [name] Darren [name] [facility] [name]. 05/20/2023 11:00 [name]: [id number] Patient Account Number: [id number] Date of Birth/Sex: Treating RN: 04/13/1951 (72 y.o. 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/23/2022 No Yes muscle E11.622 Type 2 diabetes mellitus with other skin ulcer 02/23/2023 No Yes I73.9 Peripheral vascular disease, unspecified 07/16/2022 No Yes Inactive Problems Resolved Problems ICD-10 Code Description Active Date Resolved Date L97.518 Non-pressure chronic ulcer of other part of right foot with other specified severity 07/16/2022 07/16/2022 L97.522 Non-pressure chronic ulcer of other part of left foot with fat layer exposed 07/16/2022 07/16/2022 E11.621 Type 2 diabetes mellitus with foot ulcer 07/16/2022 07/16/2022 Y86.578 Other chronic osteomyelitis, left ankle and foot 11/10/2022 11/10/2022 Electronic Signature(s) Signed: 05/21/2023 9:53:20 AM By: Geralyn Corwin DO Entered By: Geralyn Corwin on 05/20/2023 12:00:26 [name] [name] ([id number])  [id number] Page 6 of 10 -------------------------------------------------------------------------------- Progress Note Details Patient Name: Date of Service: [name] Darren [name] [facility] [name]. 05/20/2023 11:00 [name]: [id number] Patient Account Number: [id number] Date of Birth/Sex: Treating RN: 1951/02/19 (72 y.o. M) Primary Care Provider: Richarda Blade Other Clinician: Referring Provider: Treating Provider/Extender:  Geralyn Corwin Ngetich, [name] in Treatment: 44 Subjective Chief Complaint Information obtained from Patient 07/16/2022; bilateral lower extremity wounds History of Present Illness (HPI) Admission 07/16/2022 Mr. 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 He is not sure how the wounds started. He has been following with podiatry for this issue. He had removal of the tendon to the right lower extremity by Dr. Ralene Cork. He has been using Betadine to the wound beds. On 06/11/2022 he had [name] right posterior tibial artery angioplasty by Dr. Chestine Spore. It was reported the patient is optimized after revascularization of the right lower extremity. His ABIs on the left were 0.91. He currently denies systemic signs of infection. He also does not wear shoes. He came in with Kerlix wrap to his feet bilaterally. This did not completely cover his feet. 07/23/2022: This is [name] patient of Dr. Neita Garnet that she asked me to take [name] look at last week due to the significant involvement of the muscle and tendon on his right posterior leg wound. He needed an aggressive debridement and asked me if I would be  able to perform this on her behalf. The patient is here today for that procedure. When his dressing was removed in clinic today, the wound was teeming with maggots. There is necrotic muscle, tendon, and fat, with [name] thick layer of slough on the wound. 9/21; patient presents for follow-up. He was debrided by Dr. Lady Gary at last clinic visit without any issues. He has been placing Dakin's wet-to-dry dressings to the right posterior wound. He has been using Medihoney to the left foot wound. He reports improvement in wound healing. He has no issues or complaints today. 9/28; patient with type 2 diabetes PAD status post revascularization. He underwent retrograde right PTA angioplasty. Last saw Dr. Chestine Spore on 9/12 and was felt to have [name] brisk posterior tibial signal at the right ankle. He had [name] pulsatile toe tracing that appeared adequate for healing. He has been using Santyl and Hydrofera Blue on the right Achilles area.  He has [name] smaller area on the left fifth plantar metatarsal head They were apparently in the ER on 9/23 with swelling and discoloration above the wrap. They have [name] picture of the leg after the wrap was taken off which looks like that it was excessively tight superiorly 10/6; patient presents for follow-up. We have been using Santyl and Hydrofera Blue to the right lower extremity wound under Kerlix/Coban. T the left foot we o have been using silver alginate with Medihoney. He again comes in with no shoes. He has no issues or complaints today. 10/12; patient presents for follow-up. We have been using Santyl and Hydrofera Blue to the right lower extremity under Kerlix/Coban And silver alginate to the left foot wound. He has no issues today. He denies signs of infection. 10/19; patient presents for follow-up. We continue to use Santyl and Hydrofera Blue to the right lower extremity under Kerlix/Coban and silver alginate to the left foot wound. There is been improvement in wound healing. 10/26;  patient presents for follow-up. We have been using Santyl and Hydrofera Blue to the right lower extremity under Kerlix/Coban and silver alginate to the left  foot. There continues to be improvement in wound healing. Patient has no issues or complaints today. 11/2; the patient's area on the right Achilles heel looks quite healthy and is improved in terms of measurements. We have been using Hydrofera Blue. He is approved for Grafix On the left plantar foot wound over the fifth metatarsal head this tunnels over the lateral part of the met head. He is not offloading this at all 11/16; patient presents for follow-up. He has been using [name] surgical shoe with an offloading felt pad to the left lateral foot along with silver alginate. He has been approved for Grafix and this is available for placement today T the right lower extremity wound. Patient Is agreeable to this. We have been using Santyl and o Hydrofera Blue under Kerlix/Coban to this area. 11/21; patient presents for follow-up. We have been doing Grafix to the right lower extremity and silver alginate to the left foot wound. He has no issues or complaints today. 11/30; patient presents for follow-up. We have been doing Grafix to the right lower extremity wound and Medihoney with Hydrofera Blue to the left lateral foot wound. He reports pain to the left foot wound today. He did not obtain his x-ray. 12/11; patient presents for follow-up. He has been using Dakin's wet-to-dry dressings to the left plantar foot wound. We have been doing Grafix to the right lower extremity wound under compression therapy. He obtained his x-ray that showed mild periosteal elevation at the resection site with the possibility of osteomyelitis. He currently denies systemic signs of infection. He has been taking doxycycline and Augmentin without issues. He is getting his INR checked by his primary care office. 12/18; patient presents for follow-up. He has been using Dakin's  wet-to-dry dressings to the left foot wound. We have been placing Grafix under compression therapy to the right lower extremity. He is currently taking doxycycline and Augmentin. He denies signs of infection. 12/26; patient presents for follow-up. He has been using Dakin's wet-to-dry dressings to the left foot. He has been off oral antibiotics for potential bone biopsy. We have been placing Grafix under compression therapy to the right lower extremity. There has been improvement in wound healing to both sites. He states he is trying to aggressively offload the left foot. He currently denies signs of infection. Darren [name], Darren [name] ([id number]) [id number] Page 7 of 10 1/2; patient presents for follow-up. He has been doing Dakin's wet-to-dry dressings to the left foot. He saw Dr. Ralene Cork today and had [name] bone biopsy. Debridement was performed on the site. Dressing in place with Dakin's wet-to-dry. T the right leg we have been using Grafix under compression therapy. He o has no issues or complaints today. 1/9; patient presents for follow-up. We have been using Hydrofera Blue under compression therapy to the right lower extremity wound. This is well-healing. He has been using Dakin's wet-to-dry dressings to the left lateral foot wound. He has been taking ciprofloxacin due to culture results without issues. He was referred to infectious disease by podiatry. He does not have an appointment yet. 1/16; patient presents for follow-up. We were using collagen under Kerlix/Coban to the right lower extremity wound. He has been approved for Grafix and this was available for placement today. He continues to use Dakin's wet-to-dry dressings daily to the left foot. He started levofloxacin. He has an appointment with infectious disease on 1/24. 1/23; patient presents for follow-up. Grafix was placed in standard fashion to the right lower extremity wound last week. He has  been using Dakin's  wet-to-dry dressings to the left foot. He has been taking levofloxacin. He has an infectious disease appointment tomorrow as well as [name] pulmonology appointment. He followed up with vein and vascular on 1/16 and plan is for angiogram of the left lower extremity. He has [name] history of left peroneal angioplasty in 2022. 1/30; patient presents for follow-up. Grafix was placed in standard fashion to the right lower extremity wound last week. There has been improvement in healing here. He has been using Dakin's wet-to-dry dressings to the left foot. Patient saw infectious disease, Dr. Earlene Plater on 1/24 and plan is for PICC line on 2/5 with cefepime. For now he is taking levofloxacin. He has been cleared by pulmonology for HBO. Due to extensive heart history we have asked HBO clearance from his cardiologist as well. He has follow-up with them tomorrow. He currently has no issues or complaints today. 2/6; patient presents for follow-up. He started HBO therapy today and had no issues with his dive. He had an arteriogram on 2/1 by Dr. Chestine Spore. He had angioplasty of the left peroneal artery. He is optimized from [name] vascular standpoint. We have been using Grafix to the right lower extremity wound and Dakin's wet-to-dry dressings to the left foot wound. He started his IV antibiotics yesterday. 2/13; patient presents for follow-up. We have been using Grafix to the right lower extremity wound under compression therapy and Dakin's wet-to-dry dressings to the left lateral wound. He is continued IV antibiotics without issues. 2/20; Patient presents for follow-up. We have been using Grafix to the right lower extremity wound under compression and Dakin's wet-to-dry dressings to the left lateral foot wound. Per patient since he cannot do two courses of IV antibiotics daily plan is to switch to oral antibiotics. Patient has not done HBO since 2/15. Plan is to be evaluated by ophthalmology to assure there are no issues with his  vision. 2/27; patient presents for follow-up. We have been using Grafix to the right lower extremity under compression therapy and Dakin's wet-to-dry dressings to the left lateral foot wound. He has switched over to oral antibiotics for the left foot with chronic osteomyelitis. He is scheduled to see ophthalmology later this week to be cleared for HBO. 3/7; patient presents for follow-up. We have been using Hydrofera Blue under Kerlix/Coban to the right lower extremity. We have been using Dakin's wet-to-dry dressings to the left lateral foot. This wound has healed. 3/14; patient presents for follow-up. We have been using endoform with antibiotic ointment under Kerlix/Coban to the right lower extremity. His wound is healed. 02/23/2023 Patient was last seen 1 month ago. At that time as his wounds were healed. He presents today because he is concerned about the posterior right leg reopening. He had dried lymph fluid that has come off and now there is moisture to the skin. There is only [name] couple very small areas that have some skin breakdown but overall the area is still mostly epithelialized. He has been keeping the area covered. 5/6; see patient who has [name] reopened wound in the posterior distal right lower leg just above the heel area. He has severe venous insufficiency however only [name] small open area remains. We have been using silver alginate and foam the open wound area has been making good progress. 5/20; patient presents for follow-up. He has been using silver alginate with foam border dressing to the right posterior leg wound along with his compression stocking. He has no issues or complaints today. Small area  still remains open. 6/6; right Achilles wound. He has been using silver alginate [name] border foam and Tubigrip. Comes in today with callus around this wound area. They are also concerned about callus on the remanent of wound on the left plantar foot fifth met head 6/20; He has been using silver  alginate to the Achilles wound. The wound is smaller today. Reexamined previous wound site on the left plantar foot fifth met head and this has remained closed. 7/11; patient presents for follow-up. He has been using silver alginate to the posterior right leg wound. Wound is just much smaller. He has no issues or complaints today. Patient History Information obtained from Chart. Family History Diabetes - Mother, Heart Disease - Mother,Siblings. Social History Current every day smoker, Alcohol Use - Never, Drug Use - No History, Caffeine Use - Never. Medical History Respiratory Patient has history of Sleep Apnea - does not wear Cardiovascular Patient has history of Arrhythmia - [name]. Fib, Congestive Heart Failure - EF 25%, Hypertension, Peripheral Arterial Disease Denies history of Peripheral Venous Disease Endocrine Patient has history of Type II Diabetes Hospitalization/Surgery History - Angiogram 06/11/2022 Dr. Chestine Spore [facility]. - cardioversion 11/06/2021, 2015, 2017. 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 Medical [name] Surgical History Notes nd Gastrointestinal Ascites Genitourinary stage III Kidney disease Darren [name], Darren [name] ([id number]) [id number] Page 8 of 10 Objective Constitutional respirations regular, non-labored and within target range for patient.. Vitals Time Taken: 11:33 AM, Height: 68 in, Weight: 185 lbs, BMI: 28.1, Pulse: 120 bpm, Respiratory Rate: 18 breaths/min, Blood Pressure: 112/72 mmHg. Cardiovascular 2+ dorsalis pedis/posterior tibialis pulses. Psychiatric pleasant and cooperative. General Notes: T the right Achilles area there is an open wound with granulation tissue, devitalized tissue and slough accumulation. No signs of infection. o Integumentary (Hair, Skin) Wound #4 status is Open. Original cause of wound was Gradually Appeared. The date acquired was: 02/23/2023. The wound has been in treatment 12 weeks. The wound is located on the  Right,Posterior Achilles. The wound measures 0.5cm length x 0.3cm width x 0.1cm depth; 0.118cm^2 area and 0.012cm^3 volume. There is Fat Layer (Subcutaneous Tissue) exposed. There is no tunneling or undermining noted. There is [name] medium amount of serosanguineous drainage noted. The wound margin is distinct with the outline attached to the wound base. There is small (1-33%) red granulation within the wound bed. There is [name] large (67-100%) amount of necrotic tissue within the wound bed including Eschar and Adherent Slough. The periwound skin appearance exhibited: Callus, Dry/Scaly. The periwound skin appearance did not exhibit: Crepitus, Excoriation, Induration, Rash, Scarring, Maceration, Atrophie Blanche, Cyanosis, Ecchymosis, Hemosiderin Staining, Mottled, Pallor, Rubor, Erythema. Periwound temperature was noted as No Abnormality. The periwound has tenderness on palpation. 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 I debrided nonviable tissue. I recommended continue with silver alginate for dressing changes. Continue offloading this area. Follow-up in 2 weeks and I am hopeful the wound will be healed by then. Procedures Wound #4 Pre-procedure diagnosis of Wound #4 is [name] Diabetic Wound/Ulcer of the Lower Extremity located on the Right,Posterior Achilles .Severity of Tissue Pre Debridement is: Fat layer exposed. There was [name] Selective/Open Wound Skin/Epidermis Debridement with [name] total area of 0.12 sq cm performed by Geralyn Corwin, DO. With the following instrument(s): Curette to remove Non-Viable tissue/material. Material removed includes Slough and Skin:  Epidermis and after achieving pain control using Lidocaine 5% topical ointment. No specimens were  taken. [name] time out was conducted at 11:52, prior to the start of the procedure. [name] Minimum amount of bleeding was controlled with Pressure. The procedure was tolerated well. Post Debridement Measurements: 0.5cm length x 0.3cm width x 0.1cm depth; 0.012cm^3 volume. Character of Wound/Ulcer Post Debridement requires further debridement. Severity of Tissue Post Debridement is: Fat layer exposed. Post procedure Diagnosis Wound #4: Same as Pre-Procedure General Notes: Scribed for Dr. Mikey Bussing by Tommie Ard, RN. Plan Follow-up Appointments: Return Appointment in 2 weeks. - Dr. Mikey Bussing Room 9 Anesthetic: (In clinic) Topical Lidocaine 5% applied to wound bed Bathing/ Shower/ Hygiene: May shower with protection but do not get wound dressing(s) wet. Protect dressing(s) with water repellant cover (for example, large plastic bag) or [name] cast cover and may then take shower. - Tubigrips-keep on during the daytime and take off at night Edema Control - Lymphedema / SCD / Other: Avoid standing for long periods of time. Exercise regularly - As tolerated Moisturize legs daily. Off-Loading: Other: - Place pillow behind calf when lying/sitting to float heels. Wear PEG Assist shoe WOUND #4: - Achilles Wound Laterality: Right, Posterior Darren [name], Darren [name] ([id number]) [phone].pdf Page 9 of 10 Cleanser: Wound Cleanser (Generic) 1 x Per Day/30 Days Discharge Instructions: Cleanse the wound with wound cleanser prior to applying [name] clean dressing using gauze sponges, not tissue or cotton balls. Peri-Wound Care: Skin Prep (Generic) 1 x Per Day/30 Days Discharge Instructions: Use skin prep as directed Prim Dressing: Maxorb Extra Ag+ Alginate Dressing, 4x4.75 (in/in) (Generic) 1 x Per Day/30 Days ary Discharge Instructions: Apply to wound bed as instructed Secondary Dressing: Woven Gauze Sponge, Non-Sterile 4x4 in (Generic) 1 x Per Day/30 Days Discharge Instructions: Apply over primary  dressing as directed. Secondary Dressing: Zetuvit Plus Silicone Border Dressing 4x4 (in/in) (Generic) 1 x Per Day/30 Days Discharge Instructions: Apply silicone border over primary dressing as directed. 1. In office sharp debridement 2. Silver alginate 3. Follow-up in 2 weeks Electronic Signature(s) Signed: 05/21/2023 9:53:20 AM By: Geralyn Corwin DO Entered By: Geralyn Corwin on 05/20/2023 12:07:26 -------------------------------------------------------------------------------- HxROS Details Patient Name: Date of Service: Darren [name] Darren [name]. 05/20/2023 11:00 [name]: [id number] Patient Account Number: [id number] Date of Birth/Sex: Treating RN: Mar 29, 1951 (72 y.o. M) Primary Care Provider: Richarda Blade Other Clinician: Referring Provider: Treating Provider/Extender: Geralyn Corwin Ngetich, [name] in Treatment: 8 Information Obtained From Chart Respiratory Medical History: Positive for: Sleep Apnea - does not wear Cardiovascular Medical History: Positive for: Arrhythmia - [name]. Fib; Congestive Heart Failure - EF 25%; Hypertension; Peripheral Arterial Disease Negative for: Peripheral Venous Disease Gastrointestinal Medical History: Past Medical History Notes: Ascites Endocrine Medical History: Positive for: Type II Diabetes Time with diabetes: 6 years Treated with: Insulin Blood sugar tested every day: No Genitourinary Medical History: Past Medical History Notes: stage III Kidney disease Immunizations Pneumococcal Vaccine: Received Pneumococcal Vaccination: No Darren [name], Darren [name] ([id number]) [id number] Page 10 of 10 Implantable Devices No devices added Hospitalization / Surgery History Type of Hospitalization/Surgery Angiogram 06/11/2022 Dr. 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 Family and Social History Diabetes: Yes - Mother; Heart Disease: Yes - Mother,Siblings;  Current every day smoker; Alcohol Use: Never; Drug Use: No History; Caffeine Use: Never; Financial Concerns: No; Food, Clothing or Shelter Needs: No; Support System Lacking: No; Transportation Concerns: No Electronic Signature(s) Signed: 05/21/2023 9:53:20 AM By: Geralyn Corwin DO Entered By: Geralyn Corwin  on 05/20/2023 12:02:04 -------------------------------------------------------------------------------- SuperBill Details Patient Name: Date of Service: [name] Darren [name] [name]. 05/20/2023 Medical Record Number: [id number] Patient Account Number: [id number] Date of Birth/Sex: Treating RN: [birth year] (72 y.o. 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

## 2023-05-21 NOTE — Progress Notes (Signed)
[name] ([id number]) [id number] Page 1 of 7 Visit Report for 05/20/2023 Arrival Information Details Patient Name: Date of Service: [name] [name]. 05/20/2023 11:00 [name]: [id number] Patient Account Number: [id number] Date of Birth/Sex: Treating [name]: 1951/07/07 (72 y.Darren. [name] Primary Care [name]: [name] Other Clinician: Referring [name]: Treating [name]/Extender: [name], [name] in Treatment: 68 Visit Information History Since Last Visit Added or deleted any medications: No Patient Arrived: Cane Any new allergies or adverse reactions: No Arrival Time: 11:32 Had [name] fall or experienced change in No Accompanied By: daughter activities of daily living that may affect Transfer Assistance: None risk of falls: Patient Identification Verified: Yes Signs or symptoms of abuse/neglect since last visito No Secondary Verification Process Completed: Yes Hospitalized since last visit: No Patient Requires Transmission-Based Precautions: No Implantable device outside of the clinic excluding No Patient Has Alerts: Yes cellular tissue based products placed in the center Patient Alerts: Patient on Blood Thinner since last visit: Has Dressing in Place as Prescribed: Yes Pain Present Now: No Electronic Signature(s) Signed: 05/20/2023 2:48:02 PM By: [name] Entered By: Darren Ard on 05/20/2023 11:32:44 -------------------------------------------------------------------------------- Encounter Discharge Information Details Patient Name: Date of Service: [name] [name]. 05/20/2023 11:00 [name]: [id number] Patient Account Number: [id number] Date of Birth/Sex: Treating [name]: 07-24-1951 (2 y.Darren. [name] Primary Care Elleanor Guyett: [name] Other Clinician: Referring Yuko Coventry: Treating Gissella Niblack/Extender: [name], [name] in  Treatment: 33 Encounter Discharge Information Items Post Procedure Vitals Discharge Condition: Stable Temperature (F): 98.7 Ambulatory Status: Cane Pulse (bpm): 120 Discharge Destination: Home Respiratory Rate (breaths/min): 18 Transportation: Private Auto Blood Pressure (mmHg): 112/72 Accompanied By: daughter Schedule Follow-up Appointment: No Clinical Summary of Care: Electronic Signature(s) Signed: 05/20/2023 2:46:43 PM By: [name] Entered By: Darren Ard on 05/20/2023 14:46:43 [name] ([id number]) [id number] Page 2 of 7 -------------------------------------------------------------------------------- Lower Extremity Assessment Details Patient Name: Date of Service: [name] [facility] [name]. 05/20/2023 11:00 [name]: [id number] Patient Account Number: [id number] Date of Birth/Sex: Treating [name]: 31-Aug-1951 (64 y.Darren. [name] Primary Care Jazmina Muhlenkamp: [name] Other Clinician: Referring [name]: Treating Vaunda Gutterman/Extender: [name], [name] in Treatment: 44 Edema Assessment Assessed: [Left: No] [Right: No] Edema: [Left: Ye] [Right: s] Calf Left: Right: Point of Measurement: 39 cm From Medial Instep 42.2 cm Ankle Left: Right: Point of Measurement: 8 cm From Medial Instep 25.5 cm Vascular Assessment Pulses: Dorsalis Pedis Palpable: [Right:Yes] Electronic Signature(s) Signed: 05/20/2023 2:48:02 PM By: [name] Entered By: Darren Ard on 05/20/2023 11:37:30 -------------------------------------------------------------------------------- Multi Wound Chart Details Patient Name: Date of Service: [name]. 05/20/2023 11:00 [name]: [id number] Patient Account Number: [id number] Date of Birth/Sex: Treating [name]: May 28, 1951 (67 y.Darren. 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 Etiology: Extremity Sleep Apnea, Arrhythmia, Congestive N/[name] N/[name] Comorbid History: Heart Failure, Hypertension, Peripheral Arterial Disease, Type II Diabetes 02/23/2023 N/[name] N/[name] Date Acquired: 12 N/[name] N/[name] [name] of Treatment: Open N/[name] N/[name] Wound Status: No N/[name] N/[name] Wound Recurrence: 0.5x0.3x0.1 N/[name] N/[name] Measurements L x W x D (  cm) 0.118 N/[name] N/[name] [name] (cm) : rea 0.012 N/[name] N/[name] Volume (cm) : 97.50% N/[name] N/[name] % Reduction in [name] rea: 97.50% N/[name] N/[name] % Reduction in Volume: Grade 1 N/[name] N/[name] Classification: Medium N/[name] N/[name] Exudate [name] mount: Serosanguineous N/[name] N/[name] Exudate Type: red, brown N/[name] N/[name] Exudate Color: Distinct, outline attached N/[name] N/[name] Wound Margin: Small (1-33%) N/[name] N/[name] Granulation [name] mount: Red N/[name] N/[name] Granulation Quality: Large (67-100%) N/[name] N/[name] Necrotic [name] mount: Eschar, Adherent Slough N/[name] N/[name] Necrotic Tissue: Fat Layer (Subcutaneous Tissue): Yes N/[name] N/[name] Exposed Structures: Fascia: No Tendon: No Muscle: No Joint: No Bone: No Small (1-33%) N/[name] N/[name] Epithelialization: Debridement - Selective/Open Wound N/[name] N/[name] Debridement: Pre-procedure Verification/Time Out 11:52 N/[name] N/[name] Taken: Lidocaine 5% topical ointment N/[name] N/[name] Pain Control: Slough N/[name] N/[name] Tissue Debrided: Skin/Epidermis N/[name] N/[name] Level: 0.12 N/[name] N/[name] Debridement [name] (sq cm): rea Curette N/[name] N/[name] Instrument: Minimum N/[name] N/[name] Bleeding: Pressure N/[name] N/[name] Hemostasis [name] chieved: Procedure was tolerated well N/[name] N/[name] Debridement Treatment Response: 0.5x0.3x0.1 N/[name] N/[name] Post Debridement Measurements L x W x D (cm) 0.012 N/[name] N/[name] Post  Debridement Volume: (cm) Callus: Yes N/[name] N/[name] Periwound Skin Texture: Excoriation: No Induration: No Crepitus: No Rash: No Scarring: No Dry/Scaly: Yes N/[name] N/[name] Periwound Skin Moisture: Maceration: No Atrophie Blanche: No N/[name] N/[name] Periwound Skin Color: Cyanosis: No Ecchymosis: No Erythema: No Hemosiderin Staining: No Mottled: No Pallor: No Rubor: No No Abnormality N/[name] N/[name] Temperature: Yes N/[name] N/[name] Tenderness on Palpation: Debridement N/[name] N/[name] Procedures Performed: Treatment Notes Electronic Signature(s) Signed: 05/21/2023 9:53:20 AM By: Darren Corwin DO Entered By: Darren Corwin on 05/20/2023 12:00:34 Multi-Disciplinary Care Plan Details -------------------------------------------------------------------------------- [name] ([id number]) [id number] Page 4 of 7 Patient Name: Date of Service: [name] [facility] [name]. 05/20/2023 11:00 [name]: [id number] Patient Account Number: [id number] Date of Birth/Sex: Treating [name]: 23-Feb-1951 (88 y.Darren. [name] Primary Care Jamaira Sherk: [name] Other Clinician: Referring Valerya Maxton: Treating Mohmmad Saleeby/Extender: [name], [name] in Treatment: 1 Active Inactive Orientation to the Wound Care Program Nursing Diagnoses: Knowledge deficit related to the wound healing center program Goals: Patient/caregiver will verbalize understanding of the Wound Healing Center Program Date Initiated: 07/16/2022 Target Resolution Date: 07/10/2023 Goal Status: Active Interventions: Provide education on orientation to the wound center Notes: Wound/Skin Impairment Nursing Diagnoses: Knowledge deficit related to ulceration/compromised skin integrity Goals: Patient/caregiver will verbalize understanding of skin care regimen Date Initiated: 07/16/2022 Target Resolution Date: 07/10/2023 Goal Status: Active Interventions: Assess patient/caregiver ability to perform  ulcer/skin care regimen upon admission and as needed Assess ulceration(s) every visit Provide education on ulcer and skin care Treatment Activities: Skin care regimen initiated : 07/16/2022 Topical wound management initiated : 07/16/2022 Notes: Electronic Signature(s) Signed: 05/20/2023 11:45:37 AM By: [name] Entered By: Darren Ard on 05/20/2023 11:45:37 -------------------------------------------------------------------------------- Pain Assessment Details Patient Name: Date of Service: Darren Crook [facility] [name]. 05/20/2023 11:00 [name]: [id number] Patient Account Number: [id number] Date of Birth/Sex: Treating [name]: 1951/05/28 (36 y.Darren. [name] Primary Care Keionte Swicegood: [name] Other Clinician: Referring Chimene Salo: Treating Lola Lofaro/Extender: [name], [name] in Treatment: 11 Active Problems Location of Pain Severity and Description of Pain Patient Has Paino No Site Locations [name], [name] ([id number]) [id number] Page 5 of 7 Pain Management and Medication Current Pain Management: Electronic Signature(s) Signed: 05/20/2023 2:48:02 PM By: [name] Entered By: Darren Ard on 05/20/2023 11:34:24 -------------------------------------------------------------------------------- Patient/Caregiver Education Details Patient Name: Date of Service: Val Eagle DRO [name] [name]  7/11/2024andnbsp11:00 [name]: [id number] Patient Account Number: [id number] Date of Birth/Gender: Treating [name]: 11/22/1950 (35 y.Darren. [name] Primary Care Physician: [name] Other Clinician: Referring Physician: Treating Physician/Extender: Jae Dire in Treatment: 53 Education Assessment Education Provided To: Patient Education Topics Provided Wound Debridement: Methods: Explain/Verbal Responses: Reinforcements needed, State content correctly Wound/Skin  Impairment: Methods: Explain/Verbal Responses: Reinforcements needed, State content correctly Electronic Signature(s) Signed: 05/20/2023 2:48:02 PM By: [name] Entered By: Darren Ard on 05/20/2023 11:46:39 -------------------------------------------------------------------------------- Wound Assessment Details Patient Name: Date of Service: [name]. 05/20/2023 11:00 [name] [name], [name] ([id number]) [phone].pdf Page 6 of 7 Medical Record Number: [id number] Patient Account Number: [id number] Date of Birth/Sex: Treating [name]: 1951/05/20 (89 y.Darren. [name] Primary Care Vieva Brummitt: [name] Other Clinician: Referring Esty Ahuja: Treating Ekam Bonebrake/Extender: [name], [name] in Treatment: 59 Wound Status Wound Number: 4 Primary Diabetic Wound/Ulcer of the Lower Extremity Etiology: Wound Location: Right, Posterior Achilles Wound Open Wounding Event: Gradually Appeared Status: Date Acquired: 02/23/2023 Comorbid Sleep Apnea, Arrhythmia, Congestive Heart Failure, Hypertension, [name] Of Treatment: 12 History: Peripheral Arterial Disease, Type II Diabetes Clustered Wound: No Photos Wound Measurements Length: (cm) 0.5 Width: (cm) 0.3 Depth: (cm) 0.1 Area: (cm) 0.118 Volume: (cm) 0.012 % Reduction in Area: 97.5% % Reduction in Volume: 97.5% Epithelialization: Small (1-33%) Tunneling: No Undermining: No Wound Description Classification: Grade 1 Wound Margin: Distinct, outline attached Exudate Amount: Medium Exudate Type: Serosanguineous Exudate Color: red, brown Foul Odor After Cleansing: No Slough/Fibrino Yes Wound Bed Granulation Amount: Small (1-33%) Exposed Structure Granulation Quality: Red Fascia Exposed: No Necrotic Amount: Large (67-100%) Fat Layer (Subcutaneous Tissue) Exposed: Yes Necrotic Quality: Eschar, Adherent Slough Tendon Exposed: No Muscle Exposed: No Joint Exposed: No Bone  Exposed: No Periwound Skin Texture Texture Color No Abnormalities Noted: No No Abnormalities Noted: No Callus: Yes Atrophie Blanche: No Crepitus: No Cyanosis: No Excoriation: No Ecchymosis: No Induration: No Erythema: No Rash: No Hemosiderin Staining: No Scarring: No Mottled: No Pallor: No Moisture Rubor: No No Abnormalities Noted: No Dry / Scaly: Yes Temperature / Pain Maceration: No Temperature: No Abnormality Tenderness on Palpation: Yes Treatment Notes Wound #4 (Achilles) Wound Laterality: Right, Posterior Cleanser Wound Cleanser Discharge Instruction: Cleanse the wound with wound cleanser prior to applying [name] clean dressing using gauze sponges, not tissue or cotton balls. [name], [name] ([id number]) [id number] Page 7 of 7 Peri-Wound Care Skin Prep Discharge Instruction: Use skin prep as directed Topical Primary Dressing Maxorb Extra Ag+ Alginate Dressing, 4x4.75 (in/in) Discharge Instruction: Apply to wound bed as instructed Secondary Dressing Woven Gauze Sponge, Non-Sterile 4x4 in Discharge Instruction: Apply over primary dressing as directed. Zetuvit Plus Silicone Border Dressing 4x4 (in/in) Discharge Instruction: Apply silicone border over primary dressing as directed. Secured With Compression Wrap Compression Stockings Facilities manager) Signed: 05/20/2023 2:48:02 PM By: [name] Entered By: Darren Ard on 05/20/2023 11:41:31 -------------------------------------------------------------------------------- Vitals Details Patient Name: Date of Service: [name]. 05/20/2023 11:00 [name]: [id number] Patient Account Number: [id number] Date of Birth/Sex: Treating [name]: September 16, 1951 (45 y.Darren. [name] Primary Care Christine Morton: [name] Other Clinician: Referring Alliyah Roesler: Treating Laurier Jasperson/Extender: [name], [name] in Treatment: 59 Vital  Signs Time Taken: 11:33 Pulse (bpm): 120 Height (in): 68 Respiratory Rate (breaths/min): 18 Weight (lbs): 185 Blood Pressure (mmHg): 112/72 Body Mass Index (BMI): 28.1 Reference Range: 80 - 120 mg / dl Electronic Signature(s) Signed: 05/20/2023 2:48:02 PM By:  Zochol, Jamie [name] Entered By: Darren Ard on 05/20/2023 11:34:18

## 2023-05-22 NOTE — Progress Notes (Incomplete)
[facility] clinic  Provider:  Kenard Gower DNP  Code Status: Full Code  Goals of Care:     05/20/2023    1:56 PM  Advanced Directives  Does Patient Have a Medical Advance Directive? No  Would patient like information on creating a medical advance directive? No - Patient declined     Chief Complaint  Patient presents with  . Acute Visit    Medication assessment, bad reaction to medication, not sleeping    HPI: Patient is a 72 y.o. male seen today for an acute visit for medication assessment. He was accompanied by his daughter and granddaughter. Daughter stated that he sleeps all the time in the morning and awake at night. He refuses to take antidepressant. He has limited vision on both eyes making him steady. He lives alone and daughter visits him daily.    Sleeps all the time and awake at night Refuses antidepressant Gets up by himself Wants to sleep all the time.  Right heel with chronic wound, goes to wound clinic, went today Once a day daughter goes there Calls sister once a week in [place]  Talks about people making money out of them because medical can  Past Medical History:  Diagnosis Date  . A-fib (HCC)    a. (06/04/14) TEE-DC-CV; succesful; large LA 6.2 cm  . ADHD   . Ascites   . Athlete's foot   . Chronic systolic heart failure (HCC)    a. ECHO (05/2014): EF 25-30%, diff HK, RV midly dilated and sys fx mildly/mod reduced  . CKD (chronic kidney disease) stage 3, GFR 30-59 ml/min (HCC)   . Depression   . 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 Heart murmur   . HTN (hypertension)   . OSA (obstructive sleep apnea)   . PVD (peripheral vascular disease) (HCC)    s/p great toe amputation    Past Surgical History:  Procedure Laterality Date  . ABDOMINAL AORTOGRAM W/LOWER EXTREMITY N/A 05/19/2021   Procedure: ABDOMINAL AORTOGRAM  W/LOWER EXTREMITY;  Surgeon: Cephus Shelling, MD;  Location: [facility] INVASIVE CV LAB;  Service: Cardiovascular;  Laterality: N/A;  . ABDOMINAL AORTOGRAM W/LOWER EXTREMITY Right 06/11/2022   Procedure: ABDOMINAL AORTOGRAM W/LOWER EXTREMITY;  Surgeon: Cephus Shelling, MD;  Location: [facility] INVASIVE CV LAB;  Service: Cardiovascular;  Laterality: Right;  . ABDOMINAL AORTOGRAM W/LOWER EXTREMITY Left 12/10/2022   Procedure: ABDOMINAL AORTOGRAM W/LOWER EXTREMITY;  Surgeon: Cephus Shelling, MD;  Location: [facility] INVASIVE CV LAB;  Service: Cardiovascular;  Laterality: Left;  . AMPUTATION Left 05/22/2021   Procedure: LEFT FIFTH TOE AMPUTATION;  Surgeon: Cephus Shelling, MD;  Location: [facility] OR;  Service: Vascular;  Laterality: Left;  . CARDIOVERSION N/A 06/04/2014   Procedure: CARDIOVERSION;  Surgeon: Laurey Morale, MD;  Location: [facility] ENDOSCOPY;  Service: Cardiovascular;  Laterality: N/A;  . CARDIOVERSION N/A 11/06/2021   Procedure: CARDIOVERSION;  Surgeon: Laurey Morale, MD;  Location: [facility] ENDOSCOPY;  Service: Cardiovascular;  Laterality: N/A;  . NOSE SURGERY     Nasal septum surgery  . PERIPHERAL VASCULAR BALLOON ANGIOPLASTY  06/11/2022   Procedure: PERIPHERAL VASCULAR BALLOON ANGIOPLASTY;  Surgeon: Cephus Shelling, MD;  Location: [facility] INVASIVE CV LAB;  Service: Cardiovascular;;  . PERIPHERAL VASCULAR BALLOON ANGIOPLASTY Left 12/10/2022   Procedure: PERIPHERAL VASCULAR BALLOON ANGIOPLASTY;  Surgeon: Cephus Shelling, MD;  Location: [facility] INVASIVE CV LAB;  Service: Cardiovascular;  Laterality: Left;  Peroneal  . PERIPHERAL VASCULAR INTERVENTION Left 05/19/2021   Procedure: PERIPHERAL VASCULAR INTERVENTION;  Surgeon: Cephus Shelling, MD;  Location: [facility] INVASIVE CV LAB;  Service: Cardiovascular;  Laterality: Left;  . TEE WITHOUT CARDIOVERSION N/A 06/04/2014   Procedure: TRANSESOPHAGEAL ECHOCARDIOGRAM (TEE);  Surgeon: Laurey Morale, MD;  Location: [facility] ENDOSCOPY;  Service: Cardiovascular;  Laterality:  N/A;  . TEE WITHOUT CARDIOVERSION N/A 01/06/2016   Procedure: TRANSESOPHAGEAL ECHOCARDIOGRAM (TEE);  Surgeon: Pricilla Riffle, MD;  Location: [facility] ENDOSCOPY;  Service: Cardiovascular;  Laterality: N/A;  . TEE WITHOUT CARDIOVERSION N/A 11/06/2021   Procedure: TRANSESOPHAGEAL ECHOCARDIOGRAM (TEE);  Surgeon: Laurey Morale, MD;  Location: [facility] ENDOSCOPY;  Service: Cardiovascular;  Laterality: N/A;  . VASECTOMY      No Known Allergies  Outpatient Encounter Medications as of 05/20/2023  Medication Sig  . APPLE CIDER VINEGAR PO Take 30 mLs by mouth 4 (four) times a week.  . blood glucose meter kit and supplies Dispense based on patient and insurance preference. Use up to four times daily as directed. (FOR ICD-10 E10.9, E11.9).  Marland Kitchen clopidogrel (PLAVIX) 75 MG tablet Take 1 tablet (75 mg total) by mouth daily.  . diclofenac Sodium (VOLTAREN) 1 % GEL Apply 1 Application topically 4 (four) times daily as needed (pain).  Marland Kitchen ENTRESTO 49-51 MG TAKE 1 TABLET BY MOUTH TWICE DAILY  . furosemide (LASIX) 20 MG tablet Take 1.5 tablets (30 mg total) by mouth 2 (two) times daily. Patient takes 1.5 tablet in the morning and 1.5 tablet at night.  . insulin glargine (LANTUS) 100 UNIT/ML injection INJECT 8 UNITS UNDER THE SKIN DAILY  . Insulin Syringe-Needle U-100 (TRUEPLUS INSULIN SYRINGE) 30G X 5/16" 1 ML MISC USE ONCE DAILY FOR INSULIN INJECTIONS.  Marland Kitchen JARDIANCE 10 MG TABS tablet TAKE 1 TABLET(10 MG) BY MOUTH DAILY  . metoprolol succinate (TOPROL XL) 50 MG 24 hr tablet Take 1 tablet (50 mg total) by mouth daily.  . metoprolol succinate (TOPROL-XL) 25 MG 24 hr tablet Take 1 tablet (25 mg total) by mouth at bedtime.  . Misc Natural Products (JOINT SUPPORT PO) Take 1 Dose by mouth 3 (three) times a week.  . spironolactone (ALDACTONE) 25 MG tablet Take 1 tablet (25 mg total) by mouth daily.  Marland Kitchen warfarin (COUMADIN) 6 MG tablet Take 0.5-1 tablets (3-6 mg total) by mouth See admin instructions. Take 6 mg on Mon and Fri Take 3 mg on  Sun, Tues, Wed, Thurs, and Sat  . [DISCONTINUED] Blood Glucose Monitoring Suppl (ONE TOUCH ULTRA MINI) w/Device KIT 1 each by Does not apply route in the morning, at noon, and at bedtime.  . [DISCONTINUED] glucose blood (ONETOUCH ULTRA) test strip Use as instructed to test up to three times daily.  . [DISCONTINUED] Lancets (ONETOUCH ULTRASOFT) lancets Use as instructed to check blood sugar up to 3 times daily.  . Blood Glucose Monitoring Suppl (ONE TOUCH ULTRA MINI) w/Device KIT 1 each by Does not apply route in the morning, at noon, and at bedtime.  Marland Kitchen glucose blood (ONETOUCH ULTRA) test strip Use as instructed to test up to three times daily.  . Lancets (ONETOUCH ULTRASOFT) lancets Use as instructed to check blood sugar up to 3 times daily.   No facility-administered encounter medications on file as of 05/20/2023.    Review of Systems:  Review of Systems  Constitutional:  Negative for activity change, appetite change and fever.  HENT:  Negative for sore throat.   Eyes: Negative.  Barely sees on both eyes  Cardiovascular:  Negative for chest pain and leg swelling.  Gastrointestinal:  Negative for abdominal distention, diarrhea and vomiting.  Genitourinary:  Negative for dysuria, frequency and urgency.  Skin:  Negative for color change.  Neurological:  Negative for dizziness and headaches.  Psychiatric/Behavioral:  Negative for behavioral problems and sleep disturbance. The patient is not nervous/anxious.     Health Maintenance  Topic Date Due  . Pneumonia Vaccine 94+ Years old (1 of 2 - PCV) Never done  . FOOT EXAM  Never done  . OPHTHALMOLOGY EXAM  08/10/2023 (Originally 09/21/1961)  . INFLUENZA VACCINE  06/10/2023  . HEMOGLOBIN A1C  06/30/2023  . DTaP/Tdap/Td (2 - Td or Tdap) 05/01/2026  . Hepatitis C Screening  Completed  . HPV VACCINES  Aged Out  . Colonoscopy  Discontinued  . COVID-19 Vaccine  Discontinued  . Zoster Vaccines- Shingrix  Discontinued    Physical  Exam: Vitals:   05/20/23 1354  BP: 128/78  Pulse: (!) 110  Resp: 18  Temp: 97.8 F (36.6 C)  TempSrc: Temporal  SpO2: 99%  Weight: 190 lb (86.2 kg)  Height: 5\' 8"  (1.727 m)   Body mass index is 28.89 kg/m. Physical Exam Constitutional:      Appearance: Normal appearance.  HENT:     Head: Normocephalic and atraumatic.     Mouth/Throat:     Mouth: Mucous membranes are moist.  Eyes:     Conjunctiva/sclera: Conjunctivae normal.  Cardiovascular:     Rate and Rhythm: Normal rate. Rhythm irregular.     Pulses: Normal pulses.     Heart sounds: Normal heart sounds.  Pulmonary:     Effort: Pulmonary effort is normal.     Breath sounds: Normal breath sounds.  Abdominal:     General: Bowel sounds are normal.     Palpations: Abdomen is soft.  Musculoskeletal:        General: No swelling. Normal range of motion.     Cervical back: Normal range of motion.  Skin:    General: Skin is warm and dry.  Neurological:     General: No focal deficit present.     Mental Status: He is alert and oriented to person, place, and time.  Psychiatric:        Mood and Affect: Mood normal.        Behavior: Behavior normal.        Thought Content: Thought content normal.        Judgment: Judgment normal.     Labs reviewed: Basic Metabolic Panel: Recent Labs    06/18/22 1029 12/02/22 1419 12/30/22 1353 01/13/23 0950 02/10/23 1044 02/18/23 1139 04/08/23 1157  NA 140   < > 143   < > 137 140 141  K 4.5   < > 4.1   < > 4.6 4.8 4.2  CL 105   < > 106   < > 102 105 104  CO2 24   < > 30   < > 25 28 26   GLUCOSE 124*   < > 161*   < > 141* 117* 136*  BUN 32*   < > 27*   < > 42* 39* 36*  CREATININE 1.91*   < > 1.41*   < > 1.87* 1.75* 1.73*  CALCIUM 9.0   < > 9.1   < > 9.3 9.4 9.4  TSH 1.93  --  1.74  --   --   --   --    < > =  values in this interval not displayed.   Liver Function Tests: Recent Labs    06/18/22 1029 12/02/22 1419 12/30/22 1353  AST 17 14 18   ALT 22 16 20   BILITOT 0.5  0.7 0.4  PROT 7.6 7.3 7.7   No results for input(s): "LIPASE", "AMYLASE" in the last 8760 hours. No results for input(s): "AMMONIA" in the last 8760 hours. CBC: Recent Labs    06/18/22 1029 12/02/22 1419 12/10/22 0627 02/18/23 1139  WBC 9.3 7.4  --  6.7  NEUTROABS 7,133  --   --  4,523  HGB 13.1* 12.7* 14.3 13.0*  HCT 39.7 37.9* 42.0 39.1  MCV 87.3 88.3  --  90.5  PLT 222 161  --  160   Lipid Panel: Recent Labs    06/18/22 1029 12/30/22 1353  CHOL 168 162  HDL 37* 39*  LDLCALC 111* 97  TRIG 96 166*  CHOLHDL 4.5 4.2   Lab Results  Component Value Date   HGBA1C 6.5 (H) 12/30/2022    Procedures since last visit: No results found.  Assessment/Plan      Labs/tests ordered:     Next appt:  06/30/2023

## 2023-05-25 ENCOUNTER — Ambulatory Visit: Payer: Medicaid Other | Admitting: Podiatry

## 2023-06-01 ENCOUNTER — Telehealth: Payer: Self-pay | Admitting: Cardiology

## 2023-06-01 ENCOUNTER — Telehealth: Payer: Self-pay

## 2023-06-01 DIAGNOSIS — E1122 Type 2 diabetes mellitus with diabetic chronic kidney disease: Secondary | ICD-10-CM

## 2023-06-01 NOTE — Telephone Encounter (Signed)
Patient's daughter called in reporting he has been refusing to take his evening dose of Lasix.  States he is currently on 20 mg twice daily.  He has not had his evening dose for over a week.  She reports his weights have been stable and he has not had any increasing edema.  I advised okay to continue to take 20 mg daily for now while monitoring for fluid retention.  She also had questions regarding his options for Para-medicine.  I advised that she call back to speak with the advanced heart failure office once open.  She voiced understanding and thanked me for call back.

## 2023-06-01 NOTE — Telephone Encounter (Signed)
Incoming fax received from [employer] indicating the pharmacy has rejected claim for OneTouch Ultra 2 w/Device Kit.   Covered alternatives that also require a prior authorization include:  Accu-chek guide Accu-chek guide me Contour Next EZ Freestyle freedom lite Freestyle lite True metrix go glucometer  True metrix meter

## 2023-06-02 ENCOUNTER — Encounter: Payer: Self-pay | Admitting: Podiatry

## 2023-06-02 ENCOUNTER — Ambulatory Visit (INDEPENDENT_AMBULATORY_CARE_PROVIDER_SITE_OTHER): Payer: Medicare HMO | Admitting: Podiatry

## 2023-06-02 DIAGNOSIS — E1122 Type 2 diabetes mellitus with diabetic chronic kidney disease: Secondary | ICD-10-CM | POA: Diagnosis not present

## 2023-06-02 DIAGNOSIS — M79675 Pain in left toe(s): Secondary | ICD-10-CM | POA: Diagnosis not present

## 2023-06-02 DIAGNOSIS — I739 Peripheral vascular disease, unspecified: Secondary | ICD-10-CM

## 2023-06-02 DIAGNOSIS — Z794 Long term (current) use of insulin: Secondary | ICD-10-CM

## 2023-06-02 DIAGNOSIS — B351 Tinea unguium: Secondary | ICD-10-CM | POA: Diagnosis not present

## 2023-06-02 DIAGNOSIS — N1832 Chronic kidney disease, stage 3b: Secondary | ICD-10-CM | POA: Diagnosis not present

## 2023-06-02 DIAGNOSIS — M79674 Pain in right toe(s): Secondary | ICD-10-CM

## 2023-06-02 MED ORDER — ACCU-CHEK SOFTCLIX LANCETS MISC: 1.0000 | Freq: Every day | 11 refills | Status: DC

## 2023-06-02 MED ORDER — ACCU-CHEK AVIVA PLUS W/DEVICE KIT
1.0000 | PACK | Freq: Every day | 11 refills | Status: DC
Start: 2023-06-02 — End: 2023-06-30

## 2023-06-02 MED ORDER — ACCU-CHEK AVIVA PLUS VI STRP: 1.0000 | ORAL_STRIP | Freq: Every day | 11 refills | Status: DC

## 2023-06-02 NOTE — Progress Notes (Signed)
  Subjective:  Patient ID: [name], male    DOB: 06-08-1951,   MRN: [id number]  No chief complaint on file.   72 y.o. male presents concern of thickened elongated and painful nails that are difficult to trim. Requesting to have them trimmed today. Relates burning and tingling in their feet. Patient is diabetic and last A1c was  Lab Results  Component Value Date   HGBA1C 6.5 (H) 12/30/2022   .    PCP:  Ngetich, Donalee Citrin, NP    Denies any other pedal complaints. Denies n/v/f/c.   Past Medical History:  Diagnosis Date   A-fib [facility])    a. (06/04/14) TEE-DC-CV; succesful; large LA 6.2 cm   ADHD    Ascites    Athlete's foot    Chronic systolic heart failure (HCC)    a. 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 CFT <3 seconds. Normal hair growth on digits. No edema.  Skin. No lacerations or abrasions bilateral feet. Right heel wound appears to be healed. Some mild drainage noted likely from venous insufficency and swelling. Right foot wound healed. No erythema edema or purulence noted. Nails 1-5 bilateral are thickened elongated and dystrophic with subungual debris.  Musculoskeletal: MMT 5/5 bilateral lower extremities in DF, PF, Inversion and Eversion. Deceased ROM in DF of ankle joint.  Neurological: Sensation intact to light touch.   ABIs:   Summary:  Right: Resting right ankle-brachial index indicates moderate right lower  extremity arterial disease. The right toe-brachial index is  abnormal.   Left: Resting left ankle-brachial index indicates mild left lower  extremity arterial disease. The left toe-brachial index is abnormal.   Assessment:   1. Pain due to onychomycosis of toenails of both feet   2. Type 2 diabetes mellitus with stage 3b chronic kidney disease, with long-term current use of insulin (HCC)   3. PVD (peripheral vascular disease) (HCC)               Plan:  Patient was evaluated and treated and all questions answered. -Discussed and educated patient on diabetic foot care, especially with  regards to the vascular, neurological and musculoskeletal systems.  -Stressed the importance of good glycemic control and the detriment of not  controlling glucose levels in relation to the foot. -Discussed supportive shoes at all times and checking feet regularly.  -Mechanically debrided all nails 1-5 bilateral using sterile nail nipper and filed with dremel without incident  -Answered all patient questions -Will work on DM shoes next visit.  -Patient to return  in 3 months for at risk foot care -Patient advised to call the office if any problems or questions arise in the meantime.           Return in about 3 months (around 09/02/2023) for rfc.     Return in about 3 months (around 09/02/2023) for rfc.   Louann Sjogren, DPM

## 2023-06-02 NOTE — Telephone Encounter (Signed)
Check blood sugar once daily and as needed for signs of hypo/hyperglycemia. Notify provider if blood sugar is less than 75 or =/ > 200

## 2023-06-02 NOTE — Telephone Encounter (Signed)
I called patients daughter to discuss fax and it was concluded that she would like accu-check aviva plus sent to the pharmacy as she was told that is covered.  Elaine's statement correlates with the message that I received on the [employer] website when attempting to complete a PA for the Onetouch meter:  Preferred meters and test strips include: Roche (e.g., Accu-Check Aviva Plus, Accu-Check Guide, Accu-Chek Guide Me) or [employer] (e.g. TrueMetrix, TrueTrack).  Consuella Lose would like to know how often patient is to check his blood suagr, what time of day, and what the ranges should be. Dinah please advise

## 2023-06-03 ENCOUNTER — Other Ambulatory Visit: Payer: Self-pay

## 2023-06-03 ENCOUNTER — Ambulatory Visit (HOSPITAL_BASED_OUTPATIENT_CLINIC_OR_DEPARTMENT_OTHER): Payer: Medicare HMO | Admitting: Internal Medicine

## 2023-06-03 MED ORDER — CLOPIDOGREL BISULFATE 75 MG PO TABS: 75.0000 mg | ORAL_TABLET | Freq: Every day | ORAL | 11 refills | Status: DC

## 2023-06-03 NOTE — Telephone Encounter (Signed)
Pt's daughter, [name], called for Plavix refill.  Called to confirm pharmacy, no answer, lf vm.  Elaina returned the call, pharmacy confirmed, refill sent.

## 2023-06-07 ENCOUNTER — Ambulatory Visit: Payer: Medicare HMO

## 2023-06-08 ENCOUNTER — Other Ambulatory Visit: Payer: Self-pay

## 2023-06-08 MED ORDER — WARFARIN SODIUM 6 MG PO TABS: ORAL_TABLET | ORAL | 6 refills | Status: DC

## 2023-06-08 MED ORDER — WARFARIN SODIUM 6 MG PO TABS: 3.0000 mg | ORAL_TABLET | ORAL | 6 refills | Status: DC

## 2023-06-09 ENCOUNTER — Ambulatory Visit: Payer: Medicare HMO | Attending: Cardiology

## 2023-06-09 ENCOUNTER — Telehealth: Payer: Self-pay | Admitting: Licensed Clinical Social Worker

## 2023-06-09 DIAGNOSIS — I4891 Unspecified atrial fibrillation: Secondary | ICD-10-CM | POA: Diagnosis not present

## 2023-06-09 DIAGNOSIS — Z5181 Encounter for therapeutic drug level monitoring: Secondary | ICD-10-CM

## 2023-06-09 LAB — POCT INR: INR: 3.4 — AB (ref 2.0–3.0)

## 2023-06-09 NOTE — Patient Instructions (Signed)
Description   TAKE 1/2 TABLET TODAY AND THEN START taking warfarin 1/2 tablet daily except for 1 tablet on Wednesdays, and Fridays.  Recheck INR 2 weeks.  Coumadin Clinic [phone].

## 2023-06-09 NOTE — Telephone Encounter (Signed)
H&V Care Navigation CSW Progress Note  Clinical Social Worker  received message from our front desk patient access team  to request social work follow up w/ pt daughter. Upon chart review note pt daughter had contacted [employer] office on 7/24 regarding re-referral to paramedicine program (previously offered 11/2021). I will route this to [place], HF LCSW for review. Remain available as needed.   Patient is participating in a Managed Medicaid Plan:  No, [employer] Medicare and Medicaid [place] Access  SDOH Screenings   Food Insecurity: No Food Insecurity (11/10/2021)  Housing: Low Risk  (11/10/2021)  Transportation Needs: No Transportation Needs (11/10/2021)  Alcohol Screen: Low Risk  (11/10/2021)  Depression (PHQ2-9): Low Risk  (05/20/2023)  Tobacco Use: High Risk (06/02/2023)   Octavio Graves, MSW, LCSW Clinical Social Worker II [facility] Heart/Vascular Care Navigation  [phone]- work cell phone (preferred) [phone]- desk phone

## 2023-06-10 ENCOUNTER — Telehealth (HOSPITAL_COMMUNITY): Payer: Self-pay | Admitting: Licensed Clinical Social Worker

## 2023-06-10 ENCOUNTER — Encounter (HOSPITAL_BASED_OUTPATIENT_CLINIC_OR_DEPARTMENT_OTHER): Payer: Medicare HMO | Admitting: Internal Medicine

## 2023-06-10 NOTE — Telephone Encounter (Signed)
H&V Care Navigation CSW Progress Note  Clinical Social Worker informed that pt and dtr now interested in paramedicine program- was last proposed in 2023.    CSW attempted to call dtr to get details on need for referral so I could discuss with provider- unable to reach- left VM requesting return call.   SDOH Screenings   Food Insecurity: No Food Insecurity (11/10/2021)  Housing: Low Risk  (11/10/2021)  Transportation Needs: No Transportation Needs (11/10/2021)  Alcohol Screen: Low Risk  (11/10/2021)  Depression (PHQ2-9): Low Risk  (05/20/2023)  Tobacco Use: High Risk (06/02/2023)    Burna Sis, LCSW Clinical Social Worker Advanced [facility] Failure Clinic Desk#: [phone] Cell#: [phone]

## 2023-06-15 ENCOUNTER — Telehealth (HOSPITAL_COMMUNITY): Payer: Self-pay | Admitting: Licensed Clinical Social Worker

## 2023-06-15 NOTE — Telephone Encounter (Signed)
H&V Care Navigation CSW Progress Note  Clinical Social Worker called pt dtr after had requested to speak about paramedicine.  After informing of what paramedicine can do she does not think this will benefit patient.  Main concern is he is not taking his his night meds because he falls asleep.  She sets an alarm for him but this doesn't work.  Pt is unable to see so he falls asleep extra early.  Pt dtr inquired about alternative in home services.  Pt has Medicaid so CSW explained [facility] services- she will talk with pt PCP regarding this as he was just seen in their office.  She also inquired about transportation benefits- CSW explained [employer] and Medicaid transport options.  Offered to place [facility] consult to assist further but not interested at this time- will save CSW and reach out for additional assistance if needed.   SDOH Screenings   Food Insecurity: No Food Insecurity (11/10/2021)  Housing: Low Risk  (11/10/2021)  Transportation Needs: No Transportation Needs (11/10/2021)  Alcohol Screen: Low Risk  (11/10/2021)  Depression (PHQ2-9): Low Risk  (05/20/2023)  Tobacco Use: High Risk (06/02/2023)   Burna Sis, LCSW Clinical Social Worker [facility] Desk#: [phone] Cell#: [phone]

## 2023-06-17 ENCOUNTER — Ambulatory Visit (HOSPITAL_BASED_OUTPATIENT_CLINIC_OR_DEPARTMENT_OTHER): Payer: Medicare HMO | Admitting: Internal Medicine

## 2023-06-22 ENCOUNTER — Ambulatory Visit: Payer: Medicare HMO

## 2023-06-24 ENCOUNTER — Ambulatory Visit: Payer: Medicare HMO

## 2023-06-25 ENCOUNTER — Ambulatory Visit: Payer: Medicare HMO | Attending: Cardiology | Admitting: *Deleted

## 2023-06-25 DIAGNOSIS — I4891 Unspecified atrial fibrillation: Secondary | ICD-10-CM

## 2023-06-25 DIAGNOSIS — I513 Intracardiac thrombosis, not elsewhere classified: Secondary | ICD-10-CM

## 2023-06-25 DIAGNOSIS — Z5181 Encounter for therapeutic drug level monitoring: Secondary | ICD-10-CM

## 2023-06-25 DIAGNOSIS — I483 Typical atrial flutter: Secondary | ICD-10-CM

## 2023-06-25 LAB — POCT INR: INR: 2.1 (ref 2.0–3.0)

## 2023-06-25 NOTE — Patient Instructions (Signed)
Description   Continue taking warfarin 1/2 tablet daily except for 1 tablet on Wednesdays, and Fridays.  Recheck INR 4 weeks.  Coumadin Clinic [phone].

## 2023-06-28 ENCOUNTER — Ambulatory Visit (HOSPITAL_BASED_OUTPATIENT_CLINIC_OR_DEPARTMENT_OTHER): Payer: Medicare HMO | Admitting: Internal Medicine

## 2023-06-28 NOTE — Progress Notes (Unsigned)
Patient name: [name] MRN: [id number] DOB: [birth year] Sex: male  REASON FOR CONSULT: 3 month follow-up for PAD/CLI  HPI: [name] is a 72 y.o. male, with history of atrial fibrillation, congestive heart failure, CKD, diabetes, hypertension, PVD that presents for 3 month follow-up for surveillance PAD.  He is here with his daughter who states both of his lower extremity wounds have all healed.  Still going to the wound clinic.  Does have a small scab on his right second toe that was from a blister several days ago.  Patient is well-known to our practice.  He had previous left peroneal angioplasty on 05/19/2021 followed by left fifth toe amputation on 05/22/2021 that healed.  He then had a right PT angioplasty with retrograde pedal access on 06/11/2022 also for tissue loss.  Again on 12/10/2022 he had another left peroneal angioplasty.    Past Medical History:  Diagnosis Date   A-fib [facility])    a. (06/04/14) TEE-DC-CV; succesful; large LA 6.2 cm   ADHD    Ascites    Athlete's foot    Chronic systolic heart failure (HCC)    a. 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, unspecified CKD stage, unspecified whether long term insulin use (HCC)    Heart murmur    HTN (hypertension)    OSA (obstructive sleep apnea)    PVD (peripheral vascular disease) (HCC)    s/p great toe amputation    Past Surgical History:  Procedure Laterality Date   ABDOMINAL AORTOGRAM W/LOWER EXTREMITY N/A 05/19/2021   Procedure: ABDOMINAL AORTOGRAM W/LOWER EXTREMITY;  Surgeon: Cephus Shelling, MD;  Location: [facility];  Service: Cardiovascular;  Laterality: N/A;   ABDOMINAL AORTOGRAM W/LOWER EXTREMITY Right 06/11/2022   Procedure: ABDOMINAL AORTOGRAM W/LOWER EXTREMITY;  Surgeon: Cephus Shelling, MD;  Location: [facility];  Service: Cardiovascular;  Laterality: Right;   ABDOMINAL AORTOGRAM W/LOWER EXTREMITY Left 12/10/2022   Procedure: ABDOMINAL AORTOGRAM W/LOWER EXTREMITY;  Surgeon: Cephus Shelling, MD;  Location: [facility];  Service: Cardiovascular;  Laterality: Left;   AMPUTATION Left 05/22/2021   Procedure: LEFT FIFTH TOE AMPUTATION;  Surgeon: Cephus Shelling, MD;  Location: [facility];  Service: Vascular;  Laterality: Left;   CARDIOVERSION N/A 06/04/2014   Procedure: CARDIOVERSION;  Surgeon: Laurey Morale, MD;  Location[facility] ENDOSCOPY;  Service: Cardiovascular;  Laterality: N/A;   CARDIOVERSION N/A 11/06/2021   Procedure: CARDIOVERSION;  Surgeon: Laurey Morale, MD;  Location: [facility] ENDOSCOPY;  Service: Cardiovascular;  Laterality: N/A;   NOSE SURGERY     Nasal septum surgery   PERIPHERAL VASCULAR BALLOON ANGIOPLASTY  06/11/2022   Procedure: PERIPHERAL VASCULAR BALLOON ANGIOPLASTY;  Surgeon: Cephus Shelling, MD;  Location: [facility];  Service: Cardiovascular;;   PERIPHERAL VASCULAR BALLOON ANGIOPLASTY Left 12/10/2022   Procedure: PERIPHERAL VASCULAR BALLOON ANGIOPLASTY;  Surgeon: Cephus Shelling, MD;  Location: [facility];  Service: Cardiovascular;  Laterality: Left;  Peroneal   PERIPHERAL VASCULAR INTERVENTION Left 05/19/2021   Procedure: PERIPHERAL VASCULAR INTERVENTION;  Surgeon: Cephus Shelling, MD;  Location: [facility];  Service: Cardiovascular;  Laterality: Left;   TEE WITHOUT CARDIOVERSION N/A 06/04/2014   Procedure: TRANSESOPHAGEAL ECHOCARDIOGRAM (TEE);  Surgeon: Laurey Morale, MD;  Location: [facility]OSCOPY;  Service: Cardiovascular;  Laterality: N/A;   TEE WITHOUT CARDIOVERSION N/A 01/06/2016   Procedure: TRANSESOPHAGEAL ECHOCARDIOGRAM (TEE);  Surgeon: Pricilla Riffle, MD;  Location: [facility] ENDOSCOPY;  Service: Cardiovascular;  Laterality: N/A;   TEE WITHOUT CARDIOVERSION N/A 11/06/2021   Procedure:  TRANSESOPHAGEAL ECHOCARDIOGRAM (TEE);  Surgeon: Laurey Morale, MD;  Location: [facility] ENDOSCOPY;  Service: Cardiovascular;  Laterality: N/A;   VASECTOMY      Family History  Problem Relation Age of Onset   Heart attack Mother        deceased   Diabetes Mother    Heart attack Sister     SOCIAL HISTORY: Social History   Socioeconomic History   Marital status: Single    Spouse name: Not on file   Number of children: Not on file   Years of education: Not on file   Highest education level: Not on file  Occupational History   Occupation: retired  Tobacco Use   Smoking status: Some Days    Current packs/day: 0.00    Types: Cigarettes, Cigars    Start date: 05/1969    Last attempt to quit: 05/2021    Years since quitting: 2.1    Passive exposure: Never   Smokeless tobacco: Never  Vaping Use   Vaping status: Never Used  Substance and Sexual Activity   Alcohol use: Not Currently   Drug use: Never   Sexual activity: Not on file  Other Topics Concern   Not on file  Social History Narrative   ** Merged History Encounter ** Lives in [place] by himself. Retired from [employer] for [employer].       Tobacco use, amount per day now:   Past tobacco use, amount per day:   How many years did you use tobacco:   Alcohol use (drinks per week): N/A   Diet:   Do you drink/eat things with caffeine:   Marital status:  Divorced                                What year were you married? 2003   Do you live in a house, apartment, assisted living, condo, trailer, etc.? House   Is it one or more stories? One   How many persons live in your home?   Do you have pets in your home?( please list) N/A   Highest Level of education completed? Bachelors Degree   Current or past profession: Teacher-Special Ed   Do you exercise?  Yes                                Type and how often? Daily squats, and push ups.   Do you have a living will? No   Do you have a DNR form?  No                                  If not, do you want to discuss one?   Do you have signed POA/HPOA forms?  No                      If so, please bring to you appointment      Do you have any difficulty bathing or dressing yourself? Bathing only if pants not shirts/not  shorts.   Do you have any difficulty preparing food or eating? No   Do you have any difficulty managing your medications? No   Do you have any difficulty managing your finances? No   Do you have any difficulty affording your medications? No   Social Determinants of Health   Financial Resource Strain: Not on file  Food Insecurity: No Food Insecurity (11/10/2021)   Hunger Vital Sign    Worried About Running Out of Food in the Last Year: Never true    Ran Out of Food in the Last Year: Never true  Transportation Needs: No Transportation Needs (11/10/2021)   PRAPARE - Administrator, Civil Service (Medical): No    Lack of Transportation (Non-Medical): No  Physical Activity: Not on file  Stress: Not on file  Social Connections: Not on file  Intimate Partner Violence: Not on file    No Known Allergies  Current Outpatient Medications  Medication Sig Dispense Refill   Accu-Chek Softclix Lancets lancets 1 each by Other route daily. As instructed E11.22 100 each 11   APPLE CIDER VINEGAR PO Take 30 mLs by mouth 4 (four) times a week.     Blood Glucose Monitoring Suppl (ACCU-CHEK AVIVA PLUS) w/Device KIT 1 Device by Does not apply route daily. E11.22 1 kit 11   clopidogrel (PLAVIX) 75 MG tablet Take 1 tablet (75 mg total) by mouth daily. 30 tablet 11   diclofenac Sodium (VOLTAREN) 1 % GEL Apply 1 Application topically 4 (four) times daily as needed (pain).     ENTRESTO 49-51 MG TAKE 1 TABLET BY MOUTH TWICE DAILY 180 tablet 2   furosemide (LASIX) 20 MG tablet Take 1.5 tablets (30 mg total) by mouth 2 (two) times daily. Patient takes 1.5 tablet in the morning and 1.5 tablet at night. 90 tablet 2   glucose blood (ACCU-CHEK AVIVA PLUS)  test strip 1 each by Other route daily. As instructed E11.22 100 each 11   insulin glargine (LANTUS) 100 UNIT/ML injection INJECT 8 UNITS UNDER THE SKIN DAILY 10 mL 11   Insulin Syringe-Needle U-100 (TRUEPLUS INSULIN SYRINGE) 30G X 5/16" 1 ML MISC USE ONCE DAILY FOR INSULIN INJECTIONS. 100 each 2   JARDIANCE 10 MG TABS tablet TAKE 1 TABLET(10 MG) BY MOUTH DAILY 30 tablet 5   metoprolol succinate (TOPROL XL) 50 MG 24 hr tablet Take 1 tablet (50 mg total) by mouth daily. 30 tablet 3   metoprolol succinate (TOPROL-XL) 25 MG 24 hr tablet Take 1 tablet (25 mg total) by mouth at bedtime. 30 tablet 3   Misc Natural Products (JOINT SUPPORT PO) Take 1 Dose by mouth 3 (three) times a week.     spironolactone (ALDACTONE) 25 MG tablet Take 1 tablet (25 mg total) by mouth daily. 45 tablet 3   warfarin (COUMADIN) 6 MG tablet Take 1-2 tablets daily or as prescribed by [facility] 30 tablet 6   No current facility-administered medications for this visit.    REVIEW OF SYSTEMS:  [X]  denotes positive finding, [ ]  denotes negative finding Cardiac  Comments:  Chest pain or chest pressure:    Shortness of breath upon exertion:    Short of breath when lying flat:    Irregular heart rhythm:        Vascular    Pain in calf, thigh, or hip brought on by ambulation:    Pain in feet at night that wakes you up from your sleep:     Blood clot in  your veins:    Leg swelling:         Pulmonary    Oxygen at home:    Productive cough:     Wheezing:         Neurologic    Sudden weakness in arms or legs:     Sudden numbness in arms or legs:     Sudden onset of difficulty speaking or slurred speech:    Temporary loss of vision in one eye:     Problems with dizziness:         Gastrointestinal    Blood in stool:     Vomited blood:         Genitourinary    Burning when urinating:     Blood in urine:        Psychiatric    Major depression:         Hematologic    Bleeding problems:    Problems with  blood clotting too easily:        Skin    Rashes or ulcers:        Constitutional    Fever or chills:      PHYSICAL EXAM: There were no vitals filed for this visit.   GENERAL: The patient is a well-nourished male, in no acute distress. The vital signs are documented above. CARDIAC: There is a regular rate and rhythm.  VASCULAR:  Brisk PT signals bilateral Right second toe scab as pictured Left 5th toe amp previously well healed PULMONARY: No respiratory distress.    DATA:   ABIs 0.84 monophasic right and 1.07 biphasic left  Bilateral lower extremity arterial duplex with no obvious stenosis  Assessment/Plan:  72 y.o. male, with history of atrial fibrillation, congestive heart failure, CKD, diabetes, hypertension, PVD that presents for 3 month follow-up for surveillance PAD.  Patient is well-known to our practice.  He had previous left peroneal angioplasty on 05/19/2021 followed by left fifth toe amputation on 05/22/2021 that healed.  He then had a right PT angioplasty with retrograde pedal access on 06/11/2022 also for tissue loss.  Again on 12/10/2022 he had another left peroneal angioplasty.  All of his wounds have healed except for a small new scab on the right second toe that the daughter states was from a blister last week.  He is going to the wound clinic and they should be able to address this.  He has brisk PT signals bilaterally with no obvious recurrent stenosis in the lower extremities.  I will see him again in 6 months.  Discussed with his daughter let me know if the ulcer on the second toe worsens and will plan angiogram as he has a slightly lower ABI on the right.  Will repeat duplex studies in 6 months.  Cephus Shelling, MD [facility] Office: [phone]

## 2023-06-29 ENCOUNTER — Ambulatory Visit (INDEPENDENT_AMBULATORY_CARE_PROVIDER_SITE_OTHER)
Admission: RE | Admit: 2023-06-29 | Discharge: 2023-06-29 | Disposition: A | Discharge status: Home / Self Care | Payer: Medicare HMO | Source: Ambulatory Visit | Attending: Vascular Surgery | Admitting: Vascular Surgery

## 2023-06-29 ENCOUNTER — Ambulatory Visit: Payer: Medicare HMO | Admitting: Vascular Surgery

## 2023-06-29 ENCOUNTER — Ambulatory Visit (HOSPITAL_COMMUNITY)
Admission: RE | Admit: 2023-06-29 | Discharge: 2023-06-29 | Disposition: A | Discharge status: Home / Self Care | Payer: Medicare HMO | Source: Ambulatory Visit | Attending: Vascular Surgery | Admitting: Vascular Surgery

## 2023-06-29 ENCOUNTER — Encounter: Payer: Self-pay | Admitting: Vascular Surgery

## 2023-06-29 VITALS — BP 127/78 | HR 64 | Temp 97.7°F | Ht 68.0 in | Wt 191.0 lb

## 2023-06-29 DIAGNOSIS — I70223 Atherosclerosis of native arteries of extremities with rest pain, bilateral legs: Secondary | ICD-10-CM | POA: Diagnosis not present

## 2023-06-29 DIAGNOSIS — I739 Peripheral vascular disease, unspecified: Secondary | ICD-10-CM | POA: Diagnosis not present

## 2023-06-29 LAB — VAS US ABI WITH/WO TBI
Left ABI: 1.07
Right ABI: 0.84

## 2023-06-30 ENCOUNTER — Telehealth (HOSPITAL_COMMUNITY): Payer: Self-pay

## 2023-06-30 ENCOUNTER — Encounter: Payer: Self-pay | Admitting: Family

## 2023-06-30 ENCOUNTER — Ambulatory Visit (INDEPENDENT_AMBULATORY_CARE_PROVIDER_SITE_OTHER): Payer: Medicaid Other | Admitting: Family

## 2023-06-30 VITALS — BP 130/70 | HR 70 | Temp 97.2°F | Resp 18 | Ht 68.0 in | Wt 193.5 lb

## 2023-06-30 DIAGNOSIS — E785 Hyperlipidemia, unspecified: Secondary | ICD-10-CM

## 2023-06-30 DIAGNOSIS — N183 Chronic kidney disease, stage 3 unspecified: Secondary | ICD-10-CM

## 2023-06-30 DIAGNOSIS — N1832 Chronic kidney disease, stage 3b: Secondary | ICD-10-CM | POA: Diagnosis not present

## 2023-06-30 DIAGNOSIS — E1122 Type 2 diabetes mellitus with diabetic chronic kidney disease: Secondary | ICD-10-CM

## 2023-06-30 DIAGNOSIS — Z794 Long term (current) use of insulin: Secondary | ICD-10-CM | POA: Diagnosis not present

## 2023-06-30 DIAGNOSIS — I739 Peripheral vascular disease, unspecified: Secondary | ICD-10-CM

## 2023-06-30 DIAGNOSIS — E1169 Type 2 diabetes mellitus with other specified complication: Secondary | ICD-10-CM | POA: Diagnosis not present

## 2023-06-30 DIAGNOSIS — I5022 Chronic systolic (congestive) heart failure: Secondary | ICD-10-CM

## 2023-06-30 DIAGNOSIS — H6121 Impacted cerumen, right ear: Secondary | ICD-10-CM | POA: Diagnosis not present

## 2023-06-30 DIAGNOSIS — I130 Hypertensive heart and chronic kidney disease with heart failure and stage 1 through stage 4 chronic kidney disease, or unspecified chronic kidney disease: Secondary | ICD-10-CM

## 2023-06-30 DIAGNOSIS — I4891 Unspecified atrial fibrillation: Secondary | ICD-10-CM | POA: Diagnosis not present

## 2023-06-30 DIAGNOSIS — I5032 Chronic diastolic (congestive) heart failure: Secondary | ICD-10-CM | POA: Diagnosis not present

## 2023-06-30 MED ORDER — ACCU-CHEK AVIVA PLUS W/DEVICE KIT: 1.0000 | PACK | Freq: Every day | 11 refills | Status: DC

## 2023-06-30 MED ORDER — DEBROX 6.5 % OT SOLN
5.0000 [drp] | Freq: Two times a day (BID) | OTIC | 0 refills | Status: AC
Start: 2023-06-30 — End: 2023-07-04

## 2023-06-30 NOTE — Telephone Encounter (Signed)
No answer, Left message to return call.  

## 2023-06-30 NOTE — Telephone Encounter (Signed)
Patients daughter called stating that she is concerned about her fathers heart rate. Today it was 70 however she hasn't given him spironolactone yet. She's concerned that if she gives it to him it will drop his heart rate too low. Please advise.

## 2023-06-30 NOTE — Progress Notes (Signed)
Provider: Richarda Blade FNP-C   Dayton Kenley, Donalee Citrin, NP  Patient Care Team: Jackolyn Geron, Donalee Citrin, NP as PCP - General (Family Medicine) Bensimhon, Bevelyn Buckles, MD as PCP - Advanced Heart Failure (Cardiology) Lennette Bihari, MD as PCP - Cardiology (Cardiology) Lennette Bihari, MD as Consulting Physician (Cardiology) Laurey Morale, MD as Consulting Physician (Cardiology) [place], [facility] Gean Birch[place], [facility]  Extended Emergency Contact Information Primary Emergency Contact: [facility] Phone: [phone] Relation: Daughter Secondary Emergency Contact: Dawayne Patricia [place] of [place] Home Phone: [phone] Mobile Phone: [phone] Relation: Other  Code Status:  Full Code  Goals of care: Advanced Directive information    06/30/2023    1:41 PM  Advanced Directives  Does Patient Have a Medical Advance Directive? No  Would patient like information on creating a medical advance directive? No - Patient declined     Chief Complaint  Patient presents with   Follow-up    6 mths     HPI:  Pt is a 72 y.o. male seen today for 6 months for medical management of chronic diseases. He is here with daughter and granddaughter. Daughter states having a hard time getting to the doctor for Coumadin check.Patient reuses to go out for visit.States was advised at coumadin clinic to get PCP to order [facility] to check INR at home. She would like a provider to follow up for home visit instead of coming to the office.     Hypertension - no home B/p readings for review.He denies any headache,dizziness,fatigue,chest tightness,palpitation,chest pain or shortness of breath.     Type 2 DM - No home CBG for review.Daughter states was not give a glucometer at the pharmacy just pickup supplies for checking blood sugars.No signs of hypo/hyperglycemia reported. He continues to follow up with wound clinic for right foot wound management.He was also seen by  vascular specialist yesterday.  Recommend fasting blood work but states prefers to get labs done while he is here today otherwise he will not come back for lab work.Had lunch about 1-2 hrs ago.  -Send no recent fall episodes or hospitalization. Also denies any weight loss. Stated appetite is good.  Past Medical History:  Diagnosis Date   A-fib [facility])    a. (06/04/14) TEE-DC-CV; succesful; large LA 6.2 cm   ADHD    Ascites    Athlete's foot    Chronic systolic heart failure (HCC)    a. 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, unspecified CKD stage, unspecified whether long term insulin use (HCC)    Heart murmur    HTN (hypertension)    OSA (obstructive sleep apnea)    PVD (peripheral vascular disease) (HCC)    s/p great toe amputation   Past Surgical History:  Procedure Laterality Date   ABDOMINAL AORTOGRAM W/LOWER EXTREMITY N/A 05/19/2021   Procedure: ABDOMINAL AORTOGRAM W/LOWER EXTREMITY;  Surgeon: Cephus Shelling, MD;  Location: [facility] INVASIVE CV LAB;  Service: Cardiovascular;  Laterality: N/A;   ABDOMINAL AORTOGRAM W/LOWER EXTREMITY Right 06/11/2022   Procedure: ABDOMINAL AORTOGRAM W/LOWER EXTREMITY;  Surgeon: Cephus Shelling, MD;  Location: [facility]VAS[facility] CV LAB;  Service: Cardiovascular;  Laterality: Right;   ABDOMINAL AORTOGRAM W/LOWER EXTREMITY Left 12/10/2022   Procedure: ABDOMINAL AORTOGRAM W/LOWER EXTREMITY;  Surgeon: Cephus Shelling, MD;  Location: [facility]VASIVE CV LAB;  Service: Cardiovascular;  Laterality: Left;   AMPUTATION Left 05/22/2021   Procedure: LEFT FIFTH TOE AMPUTATION;  Surgeon: Cephus Shelling, MD;  Location: [facility] OR;  Service: Vascular;  Laterality: Left;   CARDIOVERSION N/A 06/04/2014   Procedure: CARDIOVERSION;  Surgeon: Laurey Morale, MD;  Location:  [facility] ENDOSCOPY;  Service: Cardiovascular;  Laterality: N/A;   CARDIOVERSION N/A 11/06/2021   Procedure: CARDIOVERSION;  Surgeon: Laurey Morale, MD;  Location: [facility] ENDOSCOPY;  Service: Cardiovascular;  Laterality: N/A;   NOSE SURGERY     Nasal septum surgery   PERIPHERAL VASCULAR BALLOON ANGIOPLASTY  06/11/2022   Procedure: PERIPHERAL VASCULAR BALLOON ANGIOPLASTY;  Surgeon: Cephus Shelling, MD;  Location: [facility];  Service: Cardiovascular;;   PERIPHERAL VASCULAR BALLOON ANGIOPLASTY Left 12/10/2022   Procedure: PERIPHERAL VASCULAR BALLOON ANGIOPLASTY;  Surgeon: Cephus Shelling, MD;  Location: [facility];  Service: Cardiovascular;  Laterality: Left;  Peroneal   PERIPHERAL VASCULAR INTERVENTION Left 05/19/2021   Procedure: PERIPHERAL VASCULAR INTERVENTION;  Surgeon: Cephus Shelling, MD;  Location: [facility];  Service: Cardiovascular;  Laterality: Left;   TEE WITHOUT CARDIOVERSION N/A 06/04/2014   Procedure: TRANSESOPHAGEAL ECHOCARDIOGRAM (TEE);  Surgeon: Laurey Morale, MD;  Location: [facility] ENDOSCOPY;  Service: Cardiovascular;  Laterality: N/A;   TEE WITHOUT CARDIOVERSION N/A 01/06/2016   Procedure: TRANSESOPHAGEAL ECHOCARDIOGRAM (TEE);  Surgeon: Pricilla Riffle, MD;  Location: [facility] ENDOSCOPY;  Service: Cardiovascular;  Laterality: N/A;   TEE WITHOUT CARDIOVERSION N/A 11/06/2021   Procedure: TRANSESOPHAGEAL ECHOCARDIOGRAM (TEE);  Surgeon: Laurey Morale, MD;  Location: [facility] ENDOSCOPY;  Service: Cardiovascular;  Laterality: N/A;   VASECTOMY      No Known Allergies  Allergies as of 06/30/2023   No Known Allergies      Medication List        Accurate as of June 30, 2023  1:49 PM. If you have any questions, ask your nurse or doctor.          Accu-Chek Aviva Plus test strip Generic drug: glucose blood 1 each by Other route daily. As instructed E11.22   Accu-Chek Aviva Plus w/Device Kit 1 Device by Does not apply route daily. E11.22   Accu-Chek Softclix  Lancets lancets 1 each by Other route daily. As instructed E11.22   APPLE CIDER VINEGAR PO Take 30 mLs by mouth 4 (four) times a week.   clopidogrel 75 MG tablet Commonly known as: Plavix Take 1 tablet (75 mg total) by mouth daily.   diclofenac Sodium 1 % Gel Commonly known as: VOLTAREN Apply 1 Application topically 4 (four) times daily as needed (pain).   Entresto 49-51 MG Generic drug: sacubitril-valsartan TAKE 1 TABLET BY MOUTH TWICE DAILY   furosemide 20 MG tablet Commonly known as: LASIX Take 20 mg by mouth daily. One Tablet by mouth in the morning.   Jardiance 10 MG Tabs tablet Generic drug: empagliflozin TAKE 1 TABLET(10 MG) BY MOUTH DAILY   JOINT SUPPORT PO Take 1 Dose by mouth 3 (three) times a week.   Lantus 100 UNIT/ML injection Generic drug: insulin glargine INJECT 8 UNITS UNDER THE SKIN DAILY   metoprolol succinate 50 MG 24 hr tablet Commonly known as: Toprol XL Take 1 tablet (50 mg total) by mouth daily.   metoprolol succinate 25 MG 24 hr tablet Commonly known as: TOPROL-XL Take 1 tablet (25 mg total) by mouth at bedtime.   spironolactone 25 MG tablet Commonly known as: ALDACTONE Take 1 tablet (  25 mg total) by mouth daily.   TRUEplus Insulin Syringe 30G X 5/16" 1 ML Misc Generic drug: Insulin Syringe-Needle U-100 USE ONCE DAILY FOR INSULIN INJECTIONS.   warfarin 6 MG tablet Commonly known as: COUMADIN Take as directed by the anticoagulation clinic. If you are unsure how to take this medication, talk to your nurse or doctor. Original instructions: Take 1-2 tablets daily or as prescribed by Coumadin Clinic        Review of Systems  Constitutional:  Negative for appetite change, chills, fatigue, fever and unexpected weight change.  HENT:  Negative for congestion, dental problem, ear discharge, ear pain, facial swelling, hearing loss, nosebleeds, postnasal drip, rhinorrhea, sinus pressure, sinus pain, sneezing, sore throat, tinnitus and trouble  swallowing.   Eyes:  Positive for visual disturbance. Negative for pain, discharge, redness and itching.  Respiratory:  Negative for cough, chest tightness, shortness of breath and wheezing.   Cardiovascular:  Negative for chest pain, palpitations and leg swelling.  Gastrointestinal:  Negative for abdominal distention, abdominal pain, blood in stool, constipation, diarrhea, nausea and vomiting.  Endocrine: Negative for cold intolerance, heat intolerance, polydipsia, polyphagia and polyuria.  Genitourinary:  Negative for difficulty urinating, dysuria, flank pain, frequency and urgency.  Musculoskeletal:  Positive for arthralgias and gait problem. Negative for back pain, joint swelling, myalgias, neck pain and neck stiffness.  Skin:  Positive for wound. Negative for color change, pallor and rash.       Wound managed by wound clinic  Neurological:  Negative for dizziness, syncope, speech difficulty, weakness, light-headedness, numbness and headaches.  Hematological:  Does not bruise/bleed easily.  Psychiatric/Behavioral:  Negative for agitation, behavioral problems, confusion, hallucinations, self-injury, sleep disturbance and suicidal ideas. The patient is not nervous/anxious.     Immunization History  Administered Date(s) Administered   Tdap 05/01/2016   Pertinent  Health Maintenance Due  Topic Date Due   FOOT EXAM  Never done   INFLUENZA VACCINE  06/10/2023   HEMOGLOBIN A1C  06/30/2023   OPHTHALMOLOGY EXAM  08/10/2023 (Originally 09/21/1961)   Colonoscopy  Discontinued      06/18/2022    9:52 AM 08/01/2022    6:45 PM 02/25/2023    2:33 PM 05/20/2023    1:56 PM 06/30/2023    1:41 PM  Fall Risk  Falls in the past year? 0  0 0 0  Was there an injury with Fall? 0   0 0  Fall Risk Category Calculator 0   0 0  Fall Risk Category (Retired) Low      (RETIRED) Patient Fall Risk Level Low fall risk High fall risk     Patient at Risk for Falls Due to No Fall Risks  No Fall Risks No Fall Risks  No Fall Risks  Fall risk Follow up Falls evaluation completed  Falls evaluation completed Falls evaluation completed Falls evaluation completed   Functional Status Survey:    Vitals:   06/30/23 1339  BP: 130/70  Pulse: 70  Resp: 18  Temp: (!) 97.2 F (36.2 C)  SpO2: 95%  Weight: 193 lb 8 oz (87.8 kg)  Height: 5\' 8"  (1.727 m)   Body mass index is 29.42 kg/m. Physical Exam Vitals reviewed.  Constitutional:      General: He is not in acute distress.    Appearance: Normal appearance. He is normal weight. He is not ill-appearing or diaphoretic.  HENT:     Head: Normocephalic.     Right Ear: There is impacted cerumen.  Left Ear: Tympanic membrane, ear canal and external ear normal. There is no impacted cerumen.     Nose: Nose normal. No congestion or rhinorrhea.     Mouth/Throat:     Mouth: Mucous membranes are moist.     Pharynx: Oropharynx is clear. No oropharyngeal exudate or posterior oropharyngeal erythema.  Eyes:     General: No scleral icterus.       Right eye: No discharge.        Left eye: No discharge.     Conjunctiva/sclera: Conjunctivae normal.     Comments: Vision impaired   Neck:     Vascular: No carotid bruit.  Cardiovascular:     Rate and Rhythm: Normal rate and regular rhythm.     Pulses: Normal pulses.     Heart sounds: Normal heart sounds. No murmur heard.    No friction rub. No gallop.  Pulmonary:     Effort: Pulmonary effort is normal. No respiratory distress.     Breath sounds: Normal breath sounds. No wheezing, rhonchi or rales.  Chest:     Chest wall: No tenderness.  Abdominal:     General: Bowel sounds are normal. There is no distension.     Palpations: Abdomen is soft. There is no mass.     Tenderness: There is no abdominal tenderness. There is no right CVA tenderness, left CVA tenderness, guarding or rebound.  Musculoskeletal:        General: No swelling or tenderness. Normal range of motion.     Cervical back: Normal range of motion. No  rigidity or tenderness.     Right lower leg: No edema.     Left lower leg: No edema.  Lymphadenopathy:     Cervical: No cervical adenopathy.  Skin:    General: Skin is warm and dry.     Coloration: Skin is not pale.     Findings: No bruising, erythema, lesion or rash.     Comments: Right foot wound not visualized   Neurological:     Mental Status: He is alert and oriented to person, place, and time.     Cranial Nerves: No cranial nerve deficit.     Sensory: No sensory deficit.     Motor: No weakness.     Coordination: Coordination normal.     Gait: Gait abnormal.  Psychiatric:        Mood and Affect: Mood normal.        Speech: Speech normal.        Behavior: Behavior is agitated.        Thought Content: Thought content normal.        Judgment: Judgment normal.     Labs reviewed: Recent Labs    02/10/23 1044 02/18/23 1139 04/08/23 1157  NA 137 140 141  K 4.6 4.8 4.2  CL 102 105 104  CO2 25 28 26   GLUCOSE 141* 117* 136*  BUN 42* 39* 36*  CREATININE 1.87* 1.75* 1.73*  CALCIUM 9.3 9.4 9.4   Recent Labs    12/02/22 1419 12/30/22 1353  AST 14 18  ALT 16 20  BILITOT 0.7 0.4  PROT 7.3 7.7   Recent Labs    12/02/22 1419 12/10/22 0627 02/18/23 1139  WBC 7.4  --  6.7  NEUTROABS  --   --  4,523  HGB 12.7* 14.3 13.0*  HCT 37.9* 42.0 39.1  MCV 88.3  --  90.5  PLT 161  --  160   Lab Results  Component Value  Date   TSH 1.74 12/30/2022   Lab Results  Component Value Date   HGBA1C 6.5 (H) 12/30/2022   Lab Results  Component Value Date   CHOL 162 12/30/2022   HDL 39 (L) 12/30/2022   LDLCALC 97 12/30/2022   TRIG 166 (H) 12/30/2022   CHOLHDL 4.2 12/30/2022    Significant Diagnostic Results in last 30 days:  VAS [place] LOWER EXTREMITY ARTERIAL DUPLEX  Result Date: 06/29/2023 LOWER EXTREMITY ARTERIAL DUPLEX STUDY Patient Name:  JAIMY HABECKER  Date of Exam:   06/29/2023 Medical Rec #: [id number]          Accession #:    [id number] Date of Birth: Nov 04, 1951          Patient Gender: M Patient Age:   17 years Exam Location:  [facility] Procedure:      VAS [place] LOWER EXTREMITY ARTERIAL DUPLEX Referring Phys: Sherald Hess --------------------------------------------------------------------------------  Indications: Ulceration, and peripheral artery disease. High Risk Factors: Hypertension.  Vascular Interventions: 12/10/22: left peroneal angioplasty.                         06/11/22: Right posterior tibial artery angioplasty via                         retrograde tibial access.                         05/19/21: Left peroneal angioplasty. Current ABI:            R 0.84 L 1.07 Performing Technologist: [place] Ponder RVS  Examination Guidelines: A complete evaluation includes B-mode imaging, spectral Doppler, color Doppler, and power Doppler as needed of all accessible portions of each vessel. Bilateral testing is considered an integral part of a complete examination. Limited examinations for reoccurring indications may be performed as noted.  +----------+--------+-----+--------+----------+--------+ RIGHT     PSV cm/sRatioStenosisWaveform  Comments +----------+--------+-----+--------+----------+--------+ CFA Prox  57                   biphasic           +----------+--------+-----+--------+----------+--------+ DFA       34                   biphasic           +----------+--------+-----+--------+----------+--------+ SFA Prox  51                   triphasic          +----------+--------+-----+--------+----------+--------+ SFA Mid   39                   triphasic          +----------+--------+-----+--------+----------+--------+ SFA Distal46                   triphasic          +----------+--------+-----+--------+----------+--------+ POP Prox  41                   triphasic          +----------+--------+-----+--------+----------+--------+ ATA Distal15                   monophasic          +----------+--------+-----+--------+----------+--------+ PTA Distal33                   monophasic         +----------+--------+-----+--------+----------+--------+  PERO Mid  38                   monophasic         +----------+--------+-----+--------+----------+--------+  +-----------+--------+-----+--------+----------+--------+ LEFT       PSV cm/sRatioStenosisWaveform  Comments +-----------+--------+-----+--------+----------+--------+ CFA Prox   75                   biphasic           +-----------+--------+-----+--------+----------+--------+ DFA        54                   biphasic           +-----------+--------+-----+--------+----------+--------+ SFA Prox   69                   biphasic           +-----------+--------+-----+--------+----------+--------+ SFA Mid    43                   biphasic           +-----------+--------+-----+--------+----------+--------+ SFA Distal 39                   triphasic          +-----------+--------+-----+--------+----------+--------+ POP Prox   132                  triphasic          +-----------+--------+-----+--------+----------+--------+ ATA Distal 22                   monophasic         +-----------+--------+-----+--------+----------+--------+ PTA Distal 29                   biphasic           +-----------+--------+-----+--------+----------+--------+ PERO Distal31                   monophasic         +-----------+--------+-----+--------+----------+--------+  Summary: Right: Patent lower extremity without obvious evidence of stenosis Left: Patent lower extremity without obvious evidence of stenosis.  See table(s) above for measurements and observations. Electronically signed by Sherald Hess MD on 06/29/2023 at 9:56:26 AM.    Final    VAS [place] ABI WITH/WO TBI  Result Date: 06/29/2023  LOWER EXTREMITY DOPPLER STUDY Patient Name:  DYLLON FREIERMUTH  Date of Exam:   06/29/2023 Medical Rec #:  [id number]          Accession #:    [id number] Date of Birth: 05-27-1951         Patient Gender: M Patient Age:   42 years Exam Location:  [facility] Procedure:      VAS [place] ABI WITH/WO TBI Referring Phys: Sherald Hess --------------------------------------------------------------------------------  Indications: Ulceration, peripheral artery disease, and left transmetatarsal              amputation. High Risk Factors: Hypertension.  Vascular Interventions: 12/10/22: left peroneal angioplasty.                         06/11/22: Right posterior tibial artery angioplasty via                         retrograde tibial access.  05/19/21: Left peroneal angioplasty. Comparison Study: 03/23/23 prior Performing Technologist: Argentina Ponder RVS  Examination Guidelines: A complete evaluation includes at minimum, Doppler waveform signals and systolic blood pressure reading at the level of bilateral brachial, anterior tibial, and posterior tibial arteries, when vessel segments are accessible. Bilateral testing is considered an integral part of a complete examination. Photoelectric Plethysmograph (PPG) waveforms and toe systolic pressure readings are included as required and additional duplex testing as needed. Limited examinations for reoccurring indications may be performed as noted.  ABI Findings: +---------+------------------+-----+----------+--------+ Right    Rt Pressure (mmHg)IndexWaveform  Comment  +---------+------------------+-----+----------+--------+ Brachial 144                                       +---------+------------------+-----+----------+--------+ PTA      122               0.84 monophasic         +---------+------------------+-----+----------+--------+ DP       60                0.41 monophasic         +---------+------------------+-----+----------+--------+ Great Toe53                0.36 Abnormal            +---------+------------------+-----+----------+--------+ +---------+------------------+-----+----------+-------+ Left     Lt Pressure (mmHg)IndexWaveform  Comment +---------+------------------+-----+----------+-------+ Brachial 146                                      +---------+------------------+-----+----------+-------+ PTA      150               1.03 biphasic          +---------+------------------+-----+----------+-------+ DP       156               1.07 monophasic        +---------+------------------+-----+----------+-------+ Great Toe99                0.68 Abnormal          +---------+------------------+-----+----------+-------+ +-------+-----------+-----------+------------+------------+ ABI/TBIToday's ABIToday's TBIPrevious ABIPrevious TBI +-------+-----------+-----------+------------+------------+ Right  0.84       0.36       0.86        0.35         +-------+-----------+-----------+------------+------------+ Left   1.07       0.68       0.97        0.55         +-------+-----------+-----------+------------+------------+ Bilateral ABIs appear essentially unchanged compared to prior study on 03/23/23.  Summary: Right: Resting right ankle-brachial index indicates mild right lower extremity arterial disease. The right toe-brachial index is abnormal. Left: Resting left ankle-brachial index is within normal range. The left toe-brachial index is abnormal. *See table(s) above for measurements and observations.  Electronically signed by Sherald Hess MD on 06/29/2023 at 9:55:59 AM.    Final     Assessment/Plan 1. Type 2 diabetes mellitus with stage 3b chronic kidney disease, with long-term current use of insulin (HCC) Lab Results  Component Value Date   HGBA1C 6.5 (H) 12/30/2022  - no home CBG for review - continue on Lantus and Jardiance  -Advised to continue to follow-up with ophthalmology for annual eye exam and podiatrist - Blood Glucose Monitoring  Suppl (ACCU-CHEK  AVIVA PLUS) w/Device KIT; 1 Device by Does not apply route daily. E11.22  Dispense: 1 kit; Refill: 11 - Hemoglobin A1c  2. Hypertensive heart and kidney disease with chronic diastolic congestive heart failure and stage 3 chronic kidney disease, unspecified whether stage 3a or 3b CKD (HCC) Blood pressure at goal -Continue on metoprolol, furosemide and spironolactone - TSH - COMPLETE METABOLIC PANEL WITH GFR - CBC with Differential/Platelet  3. Chronic systolic heart failure (HCC) No signs of fluid overload -Continue on Entresto, metoprolol, furosemide and spironolactone -Continue to follow-up with a cardiologist -Advised to notify provider for any symptoms of fluid overload.  4. PVD (peripheral vascular disease) (HCC) -Continue to follow-up with wound care for right foot wound -Wound not visualized during visit dressing dry clean and intact.  5. Atrial fibrillation  Rate control -Continue on warfarin managed by Coumadin clinic per daughter request home health nurse to check INR at home and then report to Coumadin clinic.  Home health Nurse ordered - COMPLETE METABOLIC PANEL WITH GFR - CBC with Differential/Platelet - Ambulatory referral to Home Health: Nurse to check INR and report to coumadin clinic   6. Hyperlipidemia associated with type 2 diabetes mellitus (HCC) LDL at goal  -Continue dietary modification - Lipid panel  7. Impacted cerumen of right ear TM not visualized due to cerumen impaction - Advised to Instill debrox 6.5 otic solution 5 drops into right ear twice daily x 4 days then follow up for ear lavage.May apply cotton ball at bedtime to prevent drainage to pillow. - carbamide peroxide (DEBROX) 6.5 % OTIC solution; Place 5 drops into the right ear 2 (two) times daily for 4 days.  Dispense: 2 mL; Refill: 0  Family/ staff Communication: Reviewed plan of care with patient and daughter verbalized understanding  Labs/tests ordered:  - Hemoglobin A1c -  Lipid panel - TSH - COMPLETE METABOLIC PANEL WITH GFR - CBC with Differential/Platelet  Next Appointment : Return in about 6 months (around 12/31/2023) for medical mangement of chronic issues., Right ear lavage in 1 week .    Caesar Bookman, NP

## 2023-06-30 NOTE — Telephone Encounter (Signed)
Spironolactone should not lower heart rate. Please continue.   Nari Vannatter NP-C  2:56 PM

## 2023-06-30 NOTE — Patient Instructions (Signed)
-   Instill debrox 6.5 otic solution 5 drops into right ear twice daily x 4 days then follow up for ear lavage.May apply cotton ball at bedtime to prevent drainage to pillow.  

## 2023-07-01 LAB — CBC WITH DIFFERENTIAL/PLATELET
Absolute Monocytes: 546 {cells}/uL (ref 200–950)
Basophils Absolute: 39 {cells}/uL (ref 0–200)
Basophils Relative: 0.6 %
Eosinophils Absolute: 182 {cells}/uL (ref 15–500)
Eosinophils Relative: 2.8 %
HCT: 44.6 % (ref 38.5–50.0)
Hemoglobin: 14.9 g/dL (ref 13.2–17.1)
Lymphs Abs: 1222 {cells}/uL (ref 850–3900)
MCH: 30.3 pg (ref 27.0–33.0)
MCHC: 33.4 g/dL (ref 32.0–36.0)
MCV: 90.8 fL (ref 80.0–100.0)
MPV: 11 fL (ref 7.5–12.5)
Monocytes Relative: 8.4 %
Neutro Abs: 4511 {cells}/uL (ref 1500–7800)
Neutrophils Relative %: 69.4 %
Platelets: 185 10*3/uL (ref 140–400)
RBC: 4.91 10*6/uL (ref 4.20–5.80)
RDW: 12.6 % (ref 11.0–15.0)
Total Lymphocyte: 18.8 %
WBC: 6.5 10*3/uL (ref 3.8–10.8)

## 2023-07-01 LAB — LIPID PANEL
Cholesterol: 187 mg/dL (ref <200)
HDL: 37 mg/dL — ABNORMAL LOW (ref >=40)
LDL Cholesterol (Calc): 119 mg/dL — ABNORMAL HIGH
Non-HDL Cholesterol (Calc): 150 mg/dL — ABNORMAL HIGH (ref <130)
Total CHOL/HDL Ratio: 5.1 (calc) — ABNORMAL HIGH (ref <5.0)
Triglycerides: 184 mg/dL — ABNORMAL HIGH (ref <150)

## 2023-07-01 LAB — COMPLETE METABOLIC PANEL WITH GFR
AG Ratio: 1.4 (calc) (ref 1.0–2.5)
ALT: 26 U/L (ref 9–46)
AST: 18 U/L (ref 10–35)
Albumin: 4.3 g/dL (ref 3.6–5.1)
Alkaline phosphatase (APISO): 40 U/L (ref 35–144)
BUN/Creatinine Ratio: 18 (calc) (ref 6–22)
BUN: 28 mg/dL — ABNORMAL HIGH (ref 7–25)
CO2: 27 mmol/L (ref 20–32)
Calcium: 9.3 mg/dL (ref 8.6–10.3)
Chloride: 103 mmol/L (ref 98–110)
Creat: 1.59 mg/dL — ABNORMAL HIGH (ref 0.70–1.28)
Globulin: 3 g/dL (ref 1.9–3.7)
Glucose, Bld: 123 mg/dL — ABNORMAL HIGH (ref 65–99)
Potassium: 4.4 mmol/L (ref 3.5–5.3)
Sodium: 139 mmol/L (ref 135–146)
Total Bilirubin: 0.7 mg/dL (ref 0.2–1.2)
Total Protein: 7.3 g/dL (ref 6.1–8.1)
eGFR: 46 mL/min/{1.73_m2} — ABNORMAL LOW (ref >=60)

## 2023-07-01 LAB — HEMOGLOBIN A1C
Hgb A1c MFr Bld: 6.2 %{Hb} — ABNORMAL HIGH (ref <5.7)
Mean Plasma Glucose: 131 mg/dL
eAG (mmol/L): 7.3 mmol/L

## 2023-07-01 LAB — TSH: TSH: 2.19 m[IU]/L (ref 0.40–4.50)

## 2023-07-02 ENCOUNTER — Ambulatory Visit (HOSPITAL_BASED_OUTPATIENT_CLINIC_OR_DEPARTMENT_OTHER): Payer: Medicare HMO | Admitting: Internal Medicine

## 2023-07-02 NOTE — Telephone Encounter (Signed)
Daughter aware.

## 2023-07-05 DIAGNOSIS — L97813 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: Secondary | ICD-10-CM | POA: Diagnosis not present

## 2023-07-05 DIAGNOSIS — L97522 Non-pressure chronic ulcer of other part of left foot with fat layer exposed: Secondary | ICD-10-CM | POA: Diagnosis not present

## 2023-07-05 DIAGNOSIS — E11621 Type 2 diabetes mellitus with foot ulcer: Secondary | ICD-10-CM | POA: Diagnosis not present

## 2023-07-05 DIAGNOSIS — S81801A Unspecified open wound, right lower leg, initial encounter: Secondary | ICD-10-CM | POA: Diagnosis not present

## 2023-07-05 DIAGNOSIS — I739 Peripheral vascular disease, unspecified: Secondary | ICD-10-CM | POA: Diagnosis not present

## 2023-07-08 ENCOUNTER — Other Ambulatory Visit: Payer: Self-pay | Admitting: Family

## 2023-07-08 ENCOUNTER — Encounter (HOSPITAL_BASED_OUTPATIENT_CLINIC_OR_DEPARTMENT_OTHER): Payer: Medicare HMO | Attending: Internal Medicine | Admitting: Internal Medicine

## 2023-07-08 DIAGNOSIS — Z794 Long term (current) use of insulin: Secondary | ICD-10-CM

## 2023-07-08 DIAGNOSIS — E1169 Type 2 diabetes mellitus with other specified complication: Secondary | ICD-10-CM

## 2023-07-20 ENCOUNTER — Other Ambulatory Visit: Payer: Self-pay

## 2023-07-20 ENCOUNTER — Encounter (HOSPITAL_BASED_OUTPATIENT_CLINIC_OR_DEPARTMENT_OTHER): Payer: Medicare HMO | Attending: Internal Medicine | Admitting: Internal Medicine

## 2023-07-20 DIAGNOSIS — I739 Peripheral vascular disease, unspecified: Secondary | ICD-10-CM

## 2023-07-20 DIAGNOSIS — I70223 Atherosclerosis of native arteries of extremities with rest pain, bilateral legs: Secondary | ICD-10-CM

## 2023-07-22 ENCOUNTER — Ambulatory Visit: Payer: Medicare HMO

## 2023-07-23 ENCOUNTER — Ambulatory Visit: Payer: Medicare HMO | Attending: Cardiology

## 2023-07-23 DIAGNOSIS — Z5181 Encounter for therapeutic drug level monitoring: Secondary | ICD-10-CM

## 2023-07-23 DIAGNOSIS — I513 Intracardiac thrombosis, not elsewhere classified: Secondary | ICD-10-CM

## 2023-07-23 DIAGNOSIS — I4891 Unspecified atrial fibrillation: Secondary | ICD-10-CM | POA: Diagnosis not present

## 2023-07-23 LAB — POCT INR: INR: 2.6 (ref 2.0–3.0)

## 2023-07-23 NOTE — Progress Notes (Addendum)
Patient ID: [name], male   DOB: 1951/02/02, 72 y.o.   MRN: [id number]    Advanced Heart Failure Clinic Note   PCP: [facility] Primary Cardiologist: Dr. Tresa Endo HF Cardiologist: Dr. Gala Romney  HPI: [name] is a 72 y.o. male with PAF, severe depression, DM2, CKD stage III, obesity, OSA and chronic systolic HF.  Admitted 6/15 with dyspena and LE edema. Found to be in afib with RVR.  Admitted 12/2015 wit volume overload/Afib RVR  in the setting of medication compliance. Had TEE but DC-CV was cancelled due to LA thrombus. Discharge weight 189 pounds.   Admitted 03/2016 with  A fib RVR and volume overload in the setting of medication noncompliance. Refused cath, Myoview 12/2015. EF 20% with defects noted on rest and stress images within the inferior wall and to lesser extent the septum suggest ischemic etiology. No reversibility. Psychiatry met with him and he was deemed to have the ability to make decisions.  Seen in the HF clinic in 2021. EF had normalized.    Admitted 12/22 with ADHF. Echo EF 45-50%. Diuresed 19 liters. Transitioned to lasix 20 mg daily. Hospital course c/b AKI and A F Underwent TEE-DC-CV with conversion to NSR. He was continued on amiodarone 200 mg daily. He was back in AF on discharge.  Discharge weight 203 pounds.   Admitted 2//23 after mechanical fall resulting in rib fractures and PTX.  AC stopped due to risk of fall. Discharged 12/15/21.   In 8/23 had critical limb ischemia of RLE with non-healing ulcer -> underwent right posterior tibial angioplasty by Dr. Izora Ribas he has been in AFL since at least 1/24. Was seen in Gen cardiology clinic and offered DCCV but he refused.  At last visit was still in AF with RVR. Refused DC-CV or dig. Toprol increased. Refused repeat echo or adding any meds.   Follow up 5/24, NYHA II and volume stable. Smoking 1/2 ppd. LE wounds healed. Recommended he get repeat echo.  Today he returns for HF follow  up with his daughter. Not very active, but no SOB with ADLs. Has not been taking evening doses of meds, says one of the heart meds prevents him from sleeping. He has positional dizziness, eye sight is poor. Does not wear CPAP. Denies palpitations, CP, edema, or PND/Orthopnea. Appetite fair. No fever or chills. Weight at home 192 pounds. Has Wound Ctr appt tomorrow, trying to get [facility] RN so he does not have to come to as many doc appts. Stopped smoking a couple months ago. Daughter says he does not want to leave his house and wants to die, [name] says he has no QOL and "there is nothing to do about it."    Cardiac Testing  - TEE (12/22): EF 35% No PFO . No thrombus.  - DC-CV (12/22)--> NSR  - Echo (12/22): EF 45-50% - Echo (2021): EF 55-60 - Echo (5/17): EF 15-20%  - Myoview 2/17 Perfusion: Next defects noted on rest and stress images within the inferior wall and to lesser extent the septum. No reversible defects. Wall Motion: Severe generalized hypokinesia. Left ventricle is dilute Left Ventricular Ejection Fraction: 20 % End diastolic volume 218 ml End systolic volume 173 ml  1. 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 No evidence of ischemia. 2. Severe generalized hypokinesia with dilated left ventricle. 3. Left ventricular ejection fraction 20% 4. High-risk stress test findings*.  ROS: All systems negative except as listed in  HPI, PMH and Problem List.  SH:  Social History   Socioeconomic History   Marital status: Single    Spouse name: Not on file   Number of children: Not on file   Years of education: Not on file   Highest education level: Not on file  Occupational History   Occupation: retired  Tobacco Use   Smoking status: Some Days    Current packs/day: 0.00    Types: Cigarettes, Cigars    Start date: 05/1969    Last attempt to quit: 05/2021    Years since quitting: 2.2    Passive exposure: Never   Smokeless tobacco: Never  Vaping Use    Vaping status: Never Used  Substance and Sexual Activity   Alcohol use: Not Currently   Drug use: Never   Sexual activity: Not on file  Other Topics Concern   Not on file  Social History Narrative   ** Merged History Encounter ** Lives in [place] by himself. Retired from [employer] for [employer].       Tobacco use, amount per day now:   Past tobacco use, amount per day:   How many years did you use tobacco:   Alcohol use (drinks per week): N/A   Diet:   Do you drink/eat things with caffeine:   Marital status:  Divorced                                What year were you married? 2003   Do you live in a house, apartment, assisted living, condo, trailer, etc.? House   Is it one or more stories? One   How many persons live in your home?   Do you have pets in your home?( please list) N/A   Highest Level of education completed? Bachelors Degree   Current or past profession: Teacher-Special Ed   Do you exercise?  Yes                                Type and how often? Daily squats, and push ups.   Do you have a living will? No   Do you have a DNR form?  No                                 If not, do you want to discuss one?   Do you have signed POA/HPOA forms?  No                      If so, please bring to you appointment      Do you have any difficulty bathing or dressing yourself? Bathing only if pants not shirts/not shorts.   Do you have any difficulty preparing food or eating? No   Do you have any difficulty managing your medications? No   Do you have any difficulty managing your finances? No   Do you have any difficulty affording your medications? No   Social Determinants of Health   Financial Resource Strain: Not on file  Food Insecurity: No Food Insecurity (11/10/2021)   Hunger Vital Sign    Worried About Running Out of Food in the Last Year: Never true    Ran Out of Food in the Last Year: Never true  Transportation Needs: No Transportation  Needs  (11/10/2021)   PRAPARE - Administrator, Civil Service (Medical): No    Lack of Transportation (Non-Medical): No  Physical Activity: Not on file  Stress: Not on file  Social Connections: Not on file  Intimate Partner Violence: Not on file   FH:  Family History  Problem Relation Age of Onset   Heart attack Mother        deceased   Diabetes Mother    Heart attack Sister    Past Medical History:  Diagnosis Date   A-fib [facility])    a. (06/04/14) TEE-DC-CV; succesful; large LA 6.2 cm   ADHD    Ascites    Athlete's foot    Chronic systolic heart failure (HCC)    a. 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, unspecified CKD stage, unspecified whether long term insulin use (HCC)    Heart murmur    HTN (hypertension)    OSA (obstructive sleep apnea)    PVD (peripheral vascular disease) (HCC)    s/p great toe amputation   Current Outpatient Medications  Medication Sig Dispense Refill   Accu-Chek Softclix Lancets lancets 1 each by Other route daily. As instructed E11.22 100 each 11   Blood Glucose Monitoring Suppl (ACCU-CHEK AVIVA PLUS) w/Device KIT 1 Device by Does not apply route daily. E11.22 1 kit 11   clopidogrel (PLAVIX) 75 MG tablet Take 1 tablet (75 mg total) by mouth daily. 30 tablet 11   diclofenac Sodium (VOLTAREN) 1 % GEL Apply 1 Application topically 4 (four) times daily as needed (pain).     ENTRESTO 49-51 MG TAKE 1 TABLET BY MOUTH TWICE DAILY (Patient taking differently: daily. Patient takes 1 tablet by mouth daily) 180 tablet 2   furosemide (LASIX) 20 MG tablet Take 20 mg by mouth daily. One Tablet by mouth in the morning.     glucose blood (ACCU-CHEK AVIVA PLUS) test strip 1 each by Other route daily. As instructed E11.22 100 each 11   insulin glargine (LANTUS)  100 UNIT/ML injection INJECT 8 UNITS UNDER THE SKIN DAILY 10 mL 11   Insulin Syringe-Needle U-100 (TRUEPLUS INSULIN SYRINGE) 30G X 5/16" 1 ML MISC USE ONCE DAILY FOR INSULIN INJECTIONS. 100 each 2   JARDIANCE 10 MG TABS tablet TAKE 1 TABLET(10 MG) BY MOUTH DAILY 30 tablet 5   metoprolol succinate (TOPROL XL) 50 MG 24 hr tablet Take 1 tablet (50 mg total) by mouth daily. 30 tablet 3   Misc Natural Products (JOINT SUPPORT PO) Take 1 Dose by mouth 3 (three) times a week.     spironolactone (ALDACTONE) 25 MG tablet Take 1 tablet (25 mg total) by mouth daily. 45 tablet 3   warfarin (COUMADIN) 6 MG tablet Take 1-2 tablets daily or as prescribed by Coumadin Clinic 30 tablet 6   No current facility-administered medications for this encounter.   BP 110/80   Pulse (!) 121   Wt 87.3 kg (192 lb 6.4 oz)   SpO2 99%   BMI 29.25 kg/m   Wt Readings from Last 3 Encounters:  07/26/23 87.3 kg (192 lb 6.4 oz)  06/30/23 87.8 kg (193 lb 8 oz)  06/29/23 86.6 kg (191 lb)   PHYSICAL EXAM: General:  NAD. No resp difficulty, walked into clinic, disheveled  HEENT:  Normal Neck: Supple. No JVD. Carotids 2+ bilat; no bruits. No lymphadenopathy or thryomegaly appreciated. Cor: PMI nondisplaced. Tachy irregular rate & rhythm. No rubs, gallops or murmurs. Lungs: Clear Abdomen: Soft, nontender, nondistended. No hepatosplenomegaly. No bruits or masses. Good bowel sounds. Extremities: No cyanosis, clubbing, rash, edema Neuro: Alert & oriented x 3, cranial nerves grossly intact. Moves all 4 extremities w/o difficulty. Flat affect  ReDs: 24%  ECG (personally reviewed): AFL 118 bpm  ASSESSMENT & PLAN:  1. Chronic HFmEF - Admitted 12/22 with marked volume overload. Not taking medications as prescribed. - Myoview 12/2015. EF 20% with defects noted on rest and stress images within the inferior wall and to lesser extent the septum suggest ischemic etiology. No reversibility. Refused cath multiple times. No s/s angina  currently.  - Echo (10/21): EF 55-60%. - Echo (12/22): EF 45-50%, RV moderately reduced, RVSP 85 mmHg - Refused repeat echo at last visit. I have asked him to get echo  - NYHA II-IIb, confounded by physical deconditioning. Volume OK, ReDs 24% - Stop Entresto - Start losartan (BP soft and attempting to reduce pill burden) - Increase Toprol back to 50 q am/25 q pm (bradycardia on 50 bid) - Continue spiro 25 mg daily  - Continue Lasix 20 mg daily. - Continue Jardiance 10 mg daily. - recent labs reviewed and are stable, K 4.4, SCr 1.59  2. Chronic AF/ AFL - s/p DCCV 11/06/21>>Sinus brady but was back in A fib prior to discharge.  - It appears he has been in AFL since at least 1/24. Was seen in Gen cardiology clinic and offered DCCV but he refused. - Pursuing rate control. - HR 118s today, has not had morning meds and stopped evening dose of Toprol - As above, increase Toprol back to 50/25 - Continue warfarin. Followed by Coumadin Clinic.   3. CKD Stage IIIa-IIIb - Baseline SCr 1.5-1.7  - Continue Jardiance  - Most recent SCr 1.59  4. DMII - Hgb A1C 6.2.  - on insulin per PCP  - Continue Jardiance   5. HTN - BP controlled, soft today - GDMT changes as above  6.  H/o LV Thrombus - Resolved - On warfarin for AF/AFL  7. PAD/ Chronic LE Wounds  - Followed by vascular and podiatry for right heel wound - ABIs (6/23) R moderate, L mild - In 8/23 had critical limb ischemia of RLE with non-healing ulcer -> underwent right posterior tibial angioplasty by Dr. Chestine Spore  - Wounds followed by wound clinic -> improving  Follow up in 9 months with Dr. Gala Romney, can be virtual visit as Mortez is not interested in coming to office for appts. Needs repeat echo, we discussed this today.   Anderson Malta Glen Kesinger FNP-BC 9:03 AM

## 2023-07-23 NOTE — Patient Instructions (Signed)
Continue taking warfarin 1/2 tablet daily except for 1 tablet on Wednesdays, and Fridays.  Recheck INR 4 weeks.  Coumadin Clinic [phone].

## 2023-07-26 ENCOUNTER — Ambulatory Visit (HOSPITAL_COMMUNITY)
Admission: RE | Admit: 2023-07-26 | Discharge: 2023-07-26 | Disposition: A | Discharge status: Home / Self Care | Payer: Medicare HMO | Source: Ambulatory Visit | Attending: Family Medicine | Admitting: Family Medicine

## 2023-07-26 ENCOUNTER — Telehealth: Payer: Medicare HMO | Admitting: *Deleted

## 2023-07-26 ENCOUNTER — Encounter (HOSPITAL_COMMUNITY): Payer: Self-pay

## 2023-07-26 VITALS — BP 110/80 | HR 121 | Wt 192.4 lb

## 2023-07-26 DIAGNOSIS — I4821 Permanent atrial fibrillation: Secondary | ICD-10-CM

## 2023-07-26 DIAGNOSIS — N183 Chronic kidney disease, stage 3 unspecified: Secondary | ICD-10-CM

## 2023-07-26 DIAGNOSIS — Z91148 Patient's other noncompliance with medication regimen for other reason: Secondary | ICD-10-CM | POA: Diagnosis not present

## 2023-07-26 DIAGNOSIS — I482 Chronic atrial fibrillation, unspecified: Secondary | ICD-10-CM | POA: Diagnosis not present

## 2023-07-26 DIAGNOSIS — I5022 Chronic systolic (congestive) heart failure: Secondary | ICD-10-CM | POA: Insufficient documentation

## 2023-07-26 DIAGNOSIS — Z7984 Long term (current) use of oral hypoglycemic drugs: Secondary | ICD-10-CM | POA: Diagnosis not present

## 2023-07-26 DIAGNOSIS — I10 Essential (primary) hypertension: Secondary | ICD-10-CM | POA: Diagnosis not present

## 2023-07-26 DIAGNOSIS — E1122 Type 2 diabetes mellitus with diabetic chronic kidney disease: Secondary | ICD-10-CM | POA: Insufficient documentation

## 2023-07-26 DIAGNOSIS — G4733 Obstructive sleep apnea (adult) (pediatric): Secondary | ICD-10-CM | POA: Insufficient documentation

## 2023-07-26 DIAGNOSIS — I513 Intracardiac thrombosis, not elsewhere classified: Secondary | ICD-10-CM | POA: Diagnosis not present

## 2023-07-26 DIAGNOSIS — Z79899 Other long term (current) drug therapy: Secondary | ICD-10-CM | POA: Diagnosis not present

## 2023-07-26 DIAGNOSIS — I739 Peripheral vascular disease, unspecified: Secondary | ICD-10-CM

## 2023-07-26 DIAGNOSIS — I480 Paroxysmal atrial fibrillation: Secondary | ICD-10-CM | POA: Diagnosis not present

## 2023-07-26 DIAGNOSIS — F32A Depression, unspecified: Secondary | ICD-10-CM | POA: Diagnosis not present

## 2023-07-26 DIAGNOSIS — E1151 Type 2 diabetes mellitus with diabetic peripheral angiopathy without gangrene: Secondary | ICD-10-CM | POA: Insufficient documentation

## 2023-07-26 DIAGNOSIS — Z7901 Long term (current) use of anticoagulants: Secondary | ICD-10-CM | POA: Insufficient documentation

## 2023-07-26 DIAGNOSIS — N1831 Chronic kidney disease, stage 3a: Secondary | ICD-10-CM | POA: Diagnosis not present

## 2023-07-26 DIAGNOSIS — Z794 Long term (current) use of insulin: Secondary | ICD-10-CM | POA: Diagnosis not present

## 2023-07-26 DIAGNOSIS — E669 Obesity, unspecified: Secondary | ICD-10-CM | POA: Insufficient documentation

## 2023-07-26 DIAGNOSIS — I130 Hypertensive heart and chronic kidney disease with heart failure and stage 1 through stage 4 chronic kidney disease, or unspecified chronic kidney disease: Secondary | ICD-10-CM | POA: Diagnosis not present

## 2023-07-26 MED ORDER — LOSARTAN POTASSIUM 25 MG PO TABS
25.0000 mg | ORAL_TABLET | Freq: Every day | ORAL | 3 refills | Status: DC
Start: 2023-07-26 — End: 2023-12-30

## 2023-07-26 MED ORDER — METOPROLOL TARTRATE 25 MG PO TABS
25.0000 mg | ORAL_TABLET | Freq: Every evening | ORAL | 3 refills | Status: DC
Start: 2023-07-26 — End: 2023-12-04

## 2023-07-26 NOTE — Progress Notes (Signed)
ReDS Vest / Clip - 07/26/23 0800       ReDS Vest / Clip   Station Marker D    Ruler Value 37    ReDS Value Range Low volume    ReDS Actual Value 24

## 2023-07-26 NOTE — Telephone Encounter (Signed)
[name], daughter called and stated that patient is wanting to do exercise. Daughter is unable to take patient to a Gym and with patient's vision not being good he is afraid to go out on his on.   Patient established with a Cardiologist today and she told him that PCP could possibly place a referral for a stationary bike for home.   I instructed daughter to call patient's insurance company and see if this is something that would be covered.   She agreed and will call and let us know.

## 2023-07-26 NOTE — Patient Instructions (Signed)
Re-start Toprol 50 mg every am and 25 mg every evening. Stop [employer] Losartan 25 mg daily. Return to see Dr. Gala Romney in 9 months; may be a virtual visit per patient request.  Please call us at [phone] if any questions or concerns prior to your next visit.

## 2023-07-27 ENCOUNTER — Telehealth (HOSPITAL_COMMUNITY): Payer: Self-pay

## 2023-07-27 ENCOUNTER — Other Ambulatory Visit (HOSPITAL_COMMUNITY): Payer: Self-pay

## 2023-07-27 ENCOUNTER — Encounter (HOSPITAL_BASED_OUTPATIENT_CLINIC_OR_DEPARTMENT_OTHER): Payer: Medicare HMO | Admitting: Internal Medicine

## 2023-07-27 NOTE — Telephone Encounter (Signed)
Advanced Heart Failure Patient Advocate Encounter  Patients daughter came into office inquiring about prior authorization for losartan. Review of pts plan shows that losartan does not require p augh, but does have a DUR rejection for a recently filled Entresto. Sherryll Burger has been discontinued for this patient, and I was able to get a paid claim for $0 with a DUR override.  Attempted to call [facility] to apply override, disconnected multiple times and left on hold for excess of an hour. I contacted daughter directly and provided her with all the information that the pharmacy should need, and instructed her to call me back if there are further issues.  Burnell Blanks, CPhT Rx Patient Advocate Phone: [phone]

## 2023-07-29 ENCOUNTER — Other Ambulatory Visit (HOSPITAL_COMMUNITY): Payer: Self-pay

## 2023-07-30 NOTE — Addendum Note (Signed)
Encounter addended by: Jacklynn Ganong, FNP on: 07/30/2023 7:56 AM  Actions taken: Clinical Note Signed

## 2023-08-15 ENCOUNTER — Other Ambulatory Visit: Payer: Self-pay | Admitting: Family

## 2023-08-15 DIAGNOSIS — E1122 Type 2 diabetes mellitus with diabetic chronic kidney disease: Secondary | ICD-10-CM

## 2023-08-18 ENCOUNTER — Other Ambulatory Visit: Payer: Self-pay | Admitting: Family

## 2023-08-18 NOTE — Telephone Encounter (Signed)
Patient request refill on medication that was discontinued by another provider. Medication pend and sent to PCP Ngetich, Donalee Citrin, NP

## 2023-08-18 NOTE — Telephone Encounter (Signed)
Patient to verify with Cardiology if [facility] still needed.

## 2023-08-19 NOTE — Telephone Encounter (Signed)
MyChart message sent to patient.

## 2023-08-20 ENCOUNTER — Ambulatory Visit: Payer: Medicare HMO

## 2023-08-23 ENCOUNTER — Encounter (HOSPITAL_COMMUNITY): Payer: Self-pay

## 2023-08-23 ENCOUNTER — Other Ambulatory Visit: Payer: Self-pay

## 2023-08-23 ENCOUNTER — Emergency Department (HOSPITAL_COMMUNITY)
Admission: EM | Admit: 2023-08-23 | Discharge: 2023-08-23 | Disposition: A | Discharge status: Home / Self Care | Payer: Medicare HMO | Attending: Emergency Medicine | Admitting: Emergency Medicine

## 2023-08-23 ENCOUNTER — Emergency Department (HOSPITAL_COMMUNITY): Payer: Medicare HMO

## 2023-08-23 DIAGNOSIS — Z794 Long term (current) use of insulin: Secondary | ICD-10-CM | POA: Insufficient documentation

## 2023-08-23 DIAGNOSIS — Z7901 Long term (current) use of anticoagulants: Secondary | ICD-10-CM | POA: Diagnosis not present

## 2023-08-23 DIAGNOSIS — I130 Hypertensive heart and chronic kidney disease with heart failure and stage 1 through stage 4 chronic kidney disease, or unspecified chronic kidney disease: Secondary | ICD-10-CM | POA: Diagnosis not present

## 2023-08-23 DIAGNOSIS — N189 Chronic kidney disease, unspecified: Secondary | ICD-10-CM | POA: Insufficient documentation

## 2023-08-23 DIAGNOSIS — R9431 Abnormal electrocardiogram [ECG] [EKG]: Secondary | ICD-10-CM | POA: Diagnosis not present

## 2023-08-23 DIAGNOSIS — R42 Dizziness and giddiness: Secondary | ICD-10-CM | POA: Diagnosis not present

## 2023-08-23 DIAGNOSIS — Z79899 Other long term (current) drug therapy: Secondary | ICD-10-CM | POA: Insufficient documentation

## 2023-08-23 DIAGNOSIS — I509 Heart failure, unspecified: Secondary | ICD-10-CM | POA: Insufficient documentation

## 2023-08-23 DIAGNOSIS — I4510 Unspecified right bundle-branch block: Secondary | ICD-10-CM | POA: Diagnosis not present

## 2023-08-23 DIAGNOSIS — I499 Cardiac arrhythmia, unspecified: Secondary | ICD-10-CM | POA: Diagnosis not present

## 2023-08-23 DIAGNOSIS — I10 Essential (primary) hypertension: Secondary | ICD-10-CM | POA: Diagnosis not present

## 2023-08-23 DIAGNOSIS — E1122 Type 2 diabetes mellitus with diabetic chronic kidney disease: Secondary | ICD-10-CM | POA: Diagnosis not present

## 2023-08-23 DIAGNOSIS — H60392 Other infective otitis externa, left ear: Secondary | ICD-10-CM | POA: Diagnosis not present

## 2023-08-23 DIAGNOSIS — R002 Palpitations: Secondary | ICD-10-CM | POA: Diagnosis not present

## 2023-08-23 DIAGNOSIS — J9811 Atelectasis: Secondary | ICD-10-CM | POA: Diagnosis not present

## 2023-08-23 DIAGNOSIS — I959 Hypotension, unspecified: Secondary | ICD-10-CM | POA: Diagnosis not present

## 2023-08-23 DIAGNOSIS — I4891 Unspecified atrial fibrillation: Secondary | ICD-10-CM | POA: Diagnosis not present

## 2023-08-23 DIAGNOSIS — R0989 Other specified symptoms and signs involving the circulatory and respiratory systems: Secondary | ICD-10-CM | POA: Diagnosis not present

## 2023-08-23 LAB — BASIC METABOLIC PANEL
Anion gap: 12 (ref 5–15)
BUN: 28 mg/dL — ABNORMAL HIGH (ref 8–23)
CO2: 22 mmol/L (ref 22–32)
Calcium: 9.3 mg/dL (ref 8.9–10.3)
Chloride: 106 mmol/L (ref 98–111)
Creatinine, Ser: 1.63 mg/dL — ABNORMAL HIGH (ref 0.61–1.24)
GFR, Estimated: 44 mL/min — ABNORMAL LOW (ref >60)
Glucose, Bld: 126 mg/dL — ABNORMAL HIGH (ref 70–99)
Potassium: 4.1 mmol/L (ref 3.5–5.1)
Sodium: 140 mmol/L (ref 135–145)

## 2023-08-23 LAB — CBC WITH DIFFERENTIAL/PLATELET
Abs Immature Granulocytes: 0.04 10*3/uL (ref 0.00–0.07)
Basophils Absolute: 0.1 10*3/uL (ref 0.0–0.1)
Basophils Relative: 1 %
Eosinophils Absolute: 0.1 10*3/uL (ref 0.0–0.5)
Eosinophils Relative: 1 %
HCT: 44.2 % (ref 39.0–52.0)
Hemoglobin: 14.6 g/dL (ref 13.0–17.0)
Immature Granulocytes: 0 %
Lymphocytes Relative: 15 %
Lymphs Abs: 1.4 10*3/uL (ref 0.7–4.0)
MCH: 30.9 pg (ref 26.0–34.0)
MCHC: 33 g/dL (ref 30.0–36.0)
MCV: 93.6 fL (ref 80.0–100.0)
Monocytes Absolute: 0.7 10*3/uL (ref 0.1–1.0)
Monocytes Relative: 8 %
Neutro Abs: 7.3 10*3/uL (ref 1.7–7.7)
Neutrophils Relative %: 75 %
Platelets: 192 10*3/uL (ref 150–400)
RBC: 4.72 MIL/uL (ref 4.22–5.81)
RDW: 13.2 % (ref 11.5–15.5)
WBC: 9.6 10*3/uL (ref 4.0–10.5)
nRBC: 0 % (ref 0.0–0.2)

## 2023-08-23 LAB — TROPONIN I (HIGH SENSITIVITY)
Troponin I (High Sensitivity): 22 ng/L — ABNORMAL HIGH (ref <18)
Troponin I (High Sensitivity): 19 ng/L — ABNORMAL HIGH (ref <18)

## 2023-08-23 LAB — MAGNESIUM: Magnesium: 2.2 mg/dL (ref 1.7–2.4)

## 2023-08-23 LAB — BRAIN NATRIURETIC PEPTIDE: B Natriuretic Peptide: 155.5 pg/mL — ABNORMAL HIGH (ref 0.0–100.0)

## 2023-08-23 LAB — PROTIME-INR
INR: 2.8 — ABNORMAL HIGH (ref 0.8–1.2)
Prothrombin Time: 29.9 s — ABNORMAL HIGH (ref 11.4–15.2)

## 2023-08-23 MED ORDER — MECLIZINE HCL 25 MG PO TABS: 25.0000 mg | ORAL_TABLET | Freq: Three times a day (TID) | ORAL | 0 refills | Status: DC | PRN

## 2023-08-23 NOTE — ED Provider Triage Note (Signed)
Emergency Medicine Provider Triage Evaluation Note  [name] , a 72 y.o. male  was evaluated in triage.  Patient was sent by the UC for concerns of abnormal ECG.  Patient was seen for swimmer's ear.  Denies chest pain, shortness of breath, dizziness, headache.  He is unsure if he has abnormal potassium secondary to medication.  He reports he has been told in the past his heart rate was too low and he has had recent changes in medication in the last month.   Review of Systems  Positive: As above Negative: As above  Physical Exam  BP 106/69 (BP Location: Left Arm)   Pulse 65   Temp 98.4 F (36.9 C) (Oral)   Resp 14   SpO2 97%  Gen:   Awake, no distress   Resp:  Normal effort  MSK:   Moves extremities without difficulty  Other:    Medical Decision Making  Medically screening exam initiated at 2:56 PM.  Appropriate orders placed.  [name] was informed that the remainder of the evaluation will be completed by another provider, this initial triage assessment does not replace that evaluation, and the importance of remaining in the ED until their evaluation is complete.     Melton Alar R, PA-C 08/23/23 1458

## 2023-08-23 NOTE — ED Provider Notes (Signed)
[place] [facility]GENC[facility] DEPARTMENT AT [facility] Provider Note   CSN: [id number] Arrival date & time: 08/23/23  1408     History  Chief Complaint  Patient presents with   Dizziness   Abnormal ECG    [name] is a 72 y.o. male.   Dizziness  72 year old male with past medical history of atrial fibrillation, CHF, CKD, diabetes mellitus, hypertension, PVD presenting for evaluation of dizziness and abnormal EKG.  Patient states that for the last several weeks he has struggled with intermittent dizziness.  He states that dizziness occurs when he stands up, looks down, or turns his head to the side.  He describes a spinning sensation last for several minutes before resolving.  He went to urgent care earlier today for evaluation of this dizziness and a fullness sensation in his ears.  He says that he was sent from urgent care here due to abnormalities in his heart rate.  Patient denies any recent fevers, chest pain, shortness of breath, abdominal pain.    Home Medications Prior to Admission medications   Medication Sig Start Date End Date Taking? Authorizing Provider  meclizine (ANTIVERT) 25 MG tablet Take 1 tablet (25 mg total) by mouth 3 (three) times daily as needed for dizziness. 08/23/23  Yes Lyman Speller, MD  Accu-Chek Softclix Lancets lancets 1 each by Other route daily. As instructed E11.22 06/02/23   Ngetich, Dinah C, NP  Blood Glucose Monitoring Suppl (ACCU-CHEK AVIVA PLUS) w/Device KIT 1 Device by Does not apply route daily. E11.22 06/30/23   Ngetich, Dinah C, NP  clopidogrel (PLAVIX) 75 MG tablet Take 1 tablet (75 mg total) by mouth daily. 06/03/23 06/02/24  Cephus Shelling, MD  diclofenac Sodium (VOLTAREN) 1 % GEL Apply 1 Application topically 4 (four) times daily as needed (pain).    [provider]  furosemide (LASIX) 20 MG tablet Take 20 mg by mouth daily. One Tablet by mouth in the morning.    [provider]  glucose blood (ACCU-CHEK  AVIVA PLUS) test strip 1 each by Other route daily. As instructed E11.22 06/02/23   Ngetich, Dinah C, NP  Insulin Syringe-Needle U-100 (TRUEPLUS INSULIN SYRINGE) 30G X 5/16" 1 ML MISC USE ONCE DAILY FOR INSULIN INJECTIONS. 02/25/23   Ngetich, Dinah C, NP  JARDIANCE 10 MG TABS tablet TAKE 1 TABLET(10 MG) BY MOUTH DAILY Patient taking differently: Take 10 mg by mouth daily. 04/12/23   Ngetich, Dinah C, NP  LANTUS 100 UNIT/ML injection INJECT 8 UNITS UNDER THE SKIN ONCE DAILY AS DIRECTED Patient taking differently: Inject 8 Units into the skin daily. 08/16/23   Ngetich, Dinah C, NP  losartan (COZAAR) 25 MG tablet Take 1 tablet (25 mg total) by mouth daily. 07/26/23 10/24/23  Jacklynn Ganong, FNP  metoprolol succinate (TOPROL XL) 50 MG 24 hr tablet Take 1 tablet (50 mg total) by mouth daily. 02/18/23   Medina-Vargas, Monina C, NP  metoprolol tartrate (LOPRESSOR) 25 MG tablet Take 1 tablet (25 mg total) by mouth every evening. 07/26/23 10/24/23  Jacklynn Ganong, FNP  Misc Natural Products (JOINT SUPPORT PO) Take 1 Dose by mouth 3 (three) times a week.    [provider]  spironolactone (ALDACTONE) 25 MG tablet Take 1 tablet (25 mg total) by mouth daily. 01/13/23   Robbie Lis M, PA-C  warfarin (COUMADIN) 6 MG tablet Take 1-2 tablets daily or as prescribed by [facility] 06/08/23   Leone Brand, NP      Allergies  Patient has no known allergies.    Review of Systems   Review of Systems  Neurological:  Positive for dizziness.    Physical Exam Updated Vital Signs BP (!) 163/103 (BP Location: Left Arm)   Pulse 60   Temp 97.8 F (36.6 C) (Oral)   Resp 20   SpO2 98%  Physical Exam Constitutional:      General: He is not in acute distress.    Appearance: He is not ill-appearing.  HENT:     Head: Normocephalic and atraumatic.     Right Ear: External ear normal.     Left Ear: External ear normal.     Ears:     Comments: Cotton swabs in ears    Nose: Nose normal.      Mouth/Throat:     Mouth: Mucous membranes are moist.     Pharynx: Oropharynx is clear.  Eyes:     Comments: Only able to visualize shadows  Cardiovascular:     Rate and Rhythm: Normal rate.     Pulses: Normal pulses.  Pulmonary:     Effort: Pulmonary effort is normal. No respiratory distress.  Abdominal:     General: Abdomen is flat.     Palpations: Abdomen is soft.     Tenderness: There is no abdominal tenderness. There is no guarding or rebound.  Musculoskeletal:        General: Normal range of motion.     Right lower leg: No edema.     Left lower leg: No edema.  Skin:    General: Skin is warm and dry.     Findings: No rash.  Neurological:     General: No focal deficit present.     Mental Status: He is alert.     Sensory: No sensory deficit.     Motor: No weakness.     ED Results / Procedures / Treatments   Labs (all labs ordered are listed, but only abnormal results are displayed) Labs Reviewed  BASIC METABOLIC PANEL - Abnormal; Notable for the following components:      Result Value   Glucose, Bld 126 (*)    BUN 28 (*)    Creatinine, Ser 1.63 (*)    GFR, Estimated 44 (*)    All other components within normal limits  BRAIN NATRIURETIC PEPTIDE - Abnormal; Notable for the following components:   B Natriuretic Peptide 155.5 (*)    All other components within normal limits  PROTIME-INR - Abnormal; Notable for the following components:   Prothrombin Time 29.9 (*)    INR 2.8 (*)    All other components within normal limits  TROPONIN I (HIGH SENSITIVITY) - Abnormal; Notable for the following components:   Troponin I (High Sensitivity) 22 (*)    All other components within normal limits  TROPONIN I (HIGH SENSITIVITY) - Abnormal; Notable for the following components:   Troponin I (High Sensitivity) 19 (*)    All other components within normal limits  CBC WITH DIFFERENTIAL/PLATELET  MAGNESIUM    EKG EKG Interpretation Date/Time:  Monday August 23 2023 16:30:00  EDT Ventricular Rate:  86 PR Interval:    QRS Duration:  92 QT Interval:  390 QTC Calculation: 466 R Axis:   73  Text Interpretation: Atrial flutter with variable A-V block Non-specific ST-t changes `similar appearance to last several ecgs Reconfirmed by Cathren Laine ([id number]) on 08/23/2023 8:30:35 PM  Radiology DG Chest 1 View  Result Date: 08/23/2023 CLINICAL DATA:  Dizziness. EXAM: CHEST  1  VIEW COMPARISON:  December 02, 2022 FINDINGS: The heart size and mediastinal contours are within normal limits. Low lung volumes are noted. Mild atelectasis is seen within the right lung base. There is no evidence of an acute infiltrate, pleural effusion or pneumothorax. Multilevel degenerative changes seen throughout the thoracic spine. IMPRESSION: Low lung volumes without evidence of acute cardiopulmonary disease. Electronically Signed   By: Aram Candela M.D.   On: 08/23/2023 22:38    Procedures Procedures    Medications Ordered in ED Medications - No data to display  ED Course/ Medical Decision Making/ A&P                                 Medical Decision Making Amount and/or Complexity of Data Reviewed Labs: ordered. Radiology: ordered.   72 year old male past medical history and HPI as above.  On arrival, patient's initial blood pressure was 106/69, repeat 163/103.  Patient's heart rate, respiratory rate, and oxygenation on room air were unremarkable.  No obvious cranial nerve or sensorimotor deficits on examination (with the exception of chronic legal blindness).  Overall nontoxic-appearing.  Urgent care office visit from today was reviewed.  Patient reportedly had symptomatic hypotension (78/45) with an EKG that showed a possible junctional rhythm and/or atrial fibrillation.  EMS services were subsequently called for transfer to [facility].  In addition, he was started on eardrops for a presumed otitis externa.  EKG upon arrival to [facility] was reviewed.  This EKG shows an irregular  rhythm that at some places appear to be in bigeminy, but there is not consistency with this.  The baseline is somewhat difficult to interpret in regards to P waves searching for signs of high degree AV block.  No STEMI.  When comparing to prior EKGs, patient has had a similar pattern for at least the last year.  History consistent with spontaneous//triggered episodic vestibular syndrome. Differential diagnosis for spontaneous episodic vestibular syndrome includes vestibular migraine and Meniere's. Differential for triggered episodic vestibular syndrome includes BPPV, vestibular migraine, less likely CNS lesions.  Given lack of focal neurologic deficits, no evidence of stroke or other central cause on exam currently.   Given his report of spinning sensations triggered by standing up, looking down, and turning his head to the left, chief concern is for BPPV.  Patient's laboratory workup reviewed.  White blood cell count is normal at 9.6.  On metabolic panel, patient has a creatinine 1.63, consistent with his baseline.  No other gross electrolyte derangements identified including normal potassium and magnesium.  His initial troponin was 22, repeat 19.  As he has no new ischemic EKG findings and reassuring troponin, do not feel that ACS is cause of his symptoms.   Post-ED Care:   I believe that the patient is safe for discharge home with outpatient follow-up.  We discussed the diagnosis and risks, and agree with plan to discharge home and follow up with PCP within 48 hours. Referral to see cardiology was also provided.   Patient was also advised to return to the Emergency Department if new or worsening symptoms occur. Patient understands and agrees with the plan and feels safe to go home.   The plan for this patient was discussed with my attending physician, who voiced agreement and who oversaw evaluation and treatment of this patient.    Note: Chief Executive Officer was used in the creation  of this note.      Final Clinical  Impression(s) / ED Diagnoses Final diagnoses:  Vertigo  Abnormal EKG    Rx / DC Orders ED Discharge Orders          Ordered    meclizine (ANTIVERT) 25 MG tablet  3 times daily PRN        08/23/23 2246    Ambulatory referral to Cardiology  Status:  Canceled        08/23/23 2247    Ambulatory referral to Cardiology        08/23/23 2248              Lyman Speller, MD 08/23/23 2300    Cathren Laine, MD 08/24/23 1300

## 2023-08-23 NOTE — Discharge Instructions (Signed)
[name] A Jankovich:  Thank you for allowing [place] to take care of you today.  We hope you begin feeling better soon. You were seen today for dizziness  To-Do: Please follow-up with your primary doctor to schedule an appointment with a new primary care doctor within the next 2-3 days. A prescription for meclizine has been sent to help with dizziness.  You may take this as needed in the coming days Referral to cardiology was placed.  They will contact you to schedule this appointment, but you should also call them.  Please return to the Emergency Department or call 911 if you experience chest pain, shortness of breath, severe pain, severe fever, altered mental status, or have any reason to think that you need emergency medical care.  Thank you again.  Hope you feel better soon.

## 2023-08-23 NOTE — ED Triage Notes (Signed)
Patient BIB EMS from [facility] went to get ears drained for dizziness. Sent of due to dizziness and abnormal EKG. 130/84, legal blind.

## 2023-08-24 ENCOUNTER — Telehealth: Payer: Self-pay | Admitting: Cardiovascular Disease

## 2023-08-24 ENCOUNTER — Ambulatory Visit (INDEPENDENT_AMBULATORY_CARE_PROVIDER_SITE_OTHER): Payer: Self-pay

## 2023-08-24 DIAGNOSIS — Z5181 Encounter for therapeutic drug level monitoring: Secondary | ICD-10-CM

## 2023-08-24 DIAGNOSIS — I483 Typical atrial flutter: Secondary | ICD-10-CM

## 2023-08-24 DIAGNOSIS — I513 Intracardiac thrombosis, not elsewhere classified: Secondary | ICD-10-CM

## 2023-08-24 NOTE — Telephone Encounter (Signed)
Noted  

## 2023-08-24 NOTE — Telephone Encounter (Signed)
Patient seen by Cardiologist also seen in ER. Patient hasn't responded to MyChart message.

## 2023-08-24 NOTE — Patient Instructions (Signed)
Description   Called and spoke with pt's daughter. Instructed for pt to continue taking warfarin 1/2 tablet daily except for 1 tablet on Wednesdays, and Fridays.  Recheck INR 5 weeks.  [facility] [phone]

## 2023-08-24 NOTE — Telephone Encounter (Signed)
Daughter [name]) stated patient went to hospital yesterday for ear issue and they checked his INR.  Results was 2.6.  Daughter rescheduled appointment to 11/18 and wants confirmation this would be OK.

## 2023-08-24 NOTE — Telephone Encounter (Signed)
Called and spoke with pt's daughter, [name]. Please refer to anticoagulation encounter.

## 2023-08-25 ENCOUNTER — Ambulatory Visit: Payer: Medicare HMO

## 2023-08-25 NOTE — Telephone Encounter (Signed)
Please confirm or deny so encounter can be closed.

## 2023-09-02 ENCOUNTER — Ambulatory Visit (HOSPITAL_BASED_OUTPATIENT_CLINIC_OR_DEPARTMENT_OTHER): Payer: Medicare HMO | Admitting: Internal Medicine

## 2023-09-03 DIAGNOSIS — I4510 Unspecified right bundle-branch block: Secondary | ICD-10-CM | POA: Diagnosis not present

## 2023-09-03 DIAGNOSIS — I4891 Unspecified atrial fibrillation: Secondary | ICD-10-CM | POA: Diagnosis not present

## 2023-09-06 ENCOUNTER — Encounter: Payer: Self-pay | Admitting: Cardiovascular Disease

## 2023-09-27 ENCOUNTER — Ambulatory Visit: Payer: Medicare HMO

## 2023-09-30 ENCOUNTER — Ambulatory Visit (HOSPITAL_BASED_OUTPATIENT_CLINIC_OR_DEPARTMENT_OTHER): Payer: Medicare HMO | Admitting: General Surgery

## 2023-10-05 ENCOUNTER — Ambulatory Visit: Payer: Medicare HMO

## 2023-10-14 ENCOUNTER — Other Ambulatory Visit: Payer: Self-pay | Admitting: Family

## 2023-10-19 ENCOUNTER — Ambulatory Visit: Payer: Medicare HMO

## 2023-10-20 ENCOUNTER — Ambulatory Visit: Payer: Medicare HMO

## 2023-10-25 ENCOUNTER — Ambulatory Visit: Payer: Medicare HMO

## 2023-11-02 ENCOUNTER — Other Ambulatory Visit: Payer: Self-pay | Admitting: Orthopedic Surgery

## 2023-11-02 ENCOUNTER — Other Ambulatory Visit (HOSPITAL_COMMUNITY): Payer: Self-pay | Admitting: Cardiology

## 2023-11-02 DIAGNOSIS — I5022 Chronic systolic (congestive) heart failure: Secondary | ICD-10-CM

## 2023-11-02 MED ORDER — FUROSEMIDE 20 MG PO TABS
20.0000 mg | ORAL_TABLET | Freq: Every day | ORAL | 4 refills | Status: DC
Start: 2023-11-02 — End: 2024-01-24

## 2023-11-02 NOTE — Progress Notes (Signed)
Patient daughter calls requesting furosemide refill. Per chart review furosemide 20 mg po once daily. Daughter reports cardiology advised him to take twice daily. Plan to refill as listed in Epic. Advised daughter to clarify furosemide dose with cardiology and have cardiology send provider message if any changes.

## 2023-11-05 ENCOUNTER — Ambulatory Visit: Payer: Medicare HMO | Attending: Cardiovascular Disease

## 2023-11-05 DIAGNOSIS — I513 Intracardiac thrombosis, not elsewhere classified: Secondary | ICD-10-CM | POA: Diagnosis not present

## 2023-11-05 DIAGNOSIS — Z5181 Encounter for therapeutic drug level monitoring: Secondary | ICD-10-CM

## 2023-11-05 LAB — POCT INR: INR: 1.8 — AB (ref 2.0–3.0)

## 2023-11-05 MED ORDER — WARFARIN SODIUM 6 MG PO TABS: ORAL_TABLET | ORAL | 2 refills | Status: DC

## 2023-11-05 NOTE — Patient Instructions (Signed)
Take 2 tablets today only then continue taking warfarin 1/2 tablet daily except for 1 tablet on Wednesdays, and Fridays.  Recheck INR 4 weeks.  [facility] [phone]

## 2023-11-25 ENCOUNTER — Encounter (HOSPITAL_BASED_OUTPATIENT_CLINIC_OR_DEPARTMENT_OTHER): Payer: Medicare HMO | Attending: General Surgery | Admitting: General Surgery

## 2023-11-25 DIAGNOSIS — M1990 Unspecified osteoarthritis, unspecified site: Secondary | ICD-10-CM | POA: Diagnosis not present

## 2023-11-25 DIAGNOSIS — I5022 Chronic systolic (congestive) heart failure: Secondary | ICD-10-CM | POA: Diagnosis not present

## 2023-11-25 DIAGNOSIS — L97212 Non-pressure chronic ulcer of right calf with fat layer exposed: Secondary | ICD-10-CM | POA: Diagnosis not present

## 2023-11-25 DIAGNOSIS — I872 Venous insufficiency (chronic) (peripheral): Secondary | ICD-10-CM | POA: Diagnosis not present

## 2023-11-25 DIAGNOSIS — G4733 Obstructive sleep apnea (adult) (pediatric): Secondary | ICD-10-CM | POA: Diagnosis not present

## 2023-11-25 DIAGNOSIS — L97312 Non-pressure chronic ulcer of right ankle with fat layer exposed: Secondary | ICD-10-CM | POA: Insufficient documentation

## 2023-11-25 DIAGNOSIS — M86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, left ankle and foot: Secondary | ICD-10-CM | POA: Diagnosis not present

## 2023-11-25 DIAGNOSIS — Z794 Long term (current) use of insulin: Secondary | ICD-10-CM | POA: Diagnosis not present

## 2023-11-25 DIAGNOSIS — I110 Hypertensive heart disease with heart failure: Secondary | ICD-10-CM | POA: Diagnosis not present

## 2023-11-25 DIAGNOSIS — E1142 Type 2 diabetes mellitus with diabetic polyneuropathy: Secondary | ICD-10-CM | POA: Diagnosis not present

## 2023-11-25 DIAGNOSIS — I4891 Unspecified atrial fibrillation: Secondary | ICD-10-CM | POA: Diagnosis not present

## 2023-11-25 DIAGNOSIS — E1151 Type 2 diabetes mellitus with diabetic peripheral angiopathy without gangrene: Secondary | ICD-10-CM | POA: Diagnosis not present

## 2023-11-25 DIAGNOSIS — E11622 Type 2 diabetes mellitus with other skin ulcer: Secondary | ICD-10-CM | POA: Insufficient documentation

## 2023-11-25 NOTE — Progress Notes (Addendum)
TORIAN, [name] ([id number]) [id number] Page 1 of 13 Visit Report for 11/25/2023 Chief Complaint Document Details Patient Name: Date of Service: [name] [facility] [name]. 11/25/2023 9:00 [name]: [id number] Patient Account Number: [id number] Date of Birth/Sex: Treating RN: 1951/03/15 (73 y.o. M) Primary Care Provider: Richarda Blade Other Clinician: Referring Provider: Treating Provider/Extender: Jason Nest, [name] in Treatment: 0 Information Obtained from: Patient Chief Complaint 07/16/2022; bilateral lower extremity wounds 1.16.2025: returns with re-opening of right Achilles wound Electronic Signature(s) Signed: 11/25/2023 9:50:54 AM By: Duanne Guess MD FACS Entered By: Duanne Guess on 11/25/2023 09:50:54 -------------------------------------------------------------------------------- Debridement Details Patient Name: Date of Service: [name] [name] 11/25/2023 9:00 [name]: [id number] Patient Account Number: [id number] Date of Birth/Sex: Treating RN: 12/17/1950 (73 y.o. [name] Primary Care Provider: Richarda Blade Other Clinician: Referring Provider: Treating Provider/Extender: Jason Nest, [name] in Treatment: 0 Debridement Performed for Assessment: Wound #5 Right,Posterior Lower Leg Performed By: Physician Duanne Guess, MD The following information was scribed by: Zenaida Deed The information was scribed for: Duanne Guess Debridement Type: Debridement Severity of Tissue Pre Debridement: Fat layer exposed Level of Consciousness (Pre-procedure): Awake and Alert Pre-procedure Verification/Time Out Yes - 10:00 Taken: Start Time: 10:03 Pain Control: Lidocaine 4% T opical Solution Percent of Wound Bed Debrided: 100% T Area Debrided (cm): otal 2.12 Tissue and other material debrided: Viable, Non-Viable, Slough, Subcutaneous, Slough Level:  Skin/Subcutaneous Tissue Debridement Description: Excisional Instrument: Curette Bleeding: Minimum Hemostasis Achieved: Pressure Procedural Pain: 0 Post Procedural Pain: 0 Response to Treatment: Procedure was tolerated well Level of Consciousness (Post- Awake and Alert procedure): Post Debridement Measurements of Total Wound Length: (cm) 3 Width: (cm) 0.9 Darren, [name] [name] ([id number]) [phone].pdf Page 2 of 13 Depth: (cm) 0.1 Volume: (cm) 0.212 Character of Wound/Ulcer Post Debridement: Improved Severity of Tissue Post Debridement: Fat layer exposed Post Procedure Diagnosis Same as Pre-procedure Electronic Signature(s) Signed: 11/25/2023 10:20:49 AM By: Duanne Guess MD FACS Signed: 11/25/2023 4:58:03 PM By: Zenaida Deed RN, BSN Entered By: Zenaida Deed on 11/25/2023 10:07:50 -------------------------------------------------------------------------------- HPI Details Patient Name: Date of Service: [name] [name] 11/25/2023 9:00 [name]: [id number] Patient Account Number: [id number] Date of Birth/Sex: Treating RN: [birth year] (73 y.o. M) Primary Care Provider: Richarda Blade Other Clinician: Referring Provider: Treating Provider/Extender: Jason Nest, [name] in Treatment: 0 History of Present Illness HPI Description: Admission 07/16/2022 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 He is not sure how the wounds started. He has been following with podiatry for this issue. He had removal of the tendon to the right lower extremity by Dr.  Ralene Cork. He has been using Betadine to the wound beds. On 06/11/2022 he had [name] right posterior tibial artery angioplasty by Dr. Chestine Spore. It was reported the patient is optimized after revascularization of the right lower extremity. His ABIs on the left were 0.91. He currently denies systemic signs of infection. He also does not wear shoes. He came in with Kerlix wrap to his feet bilaterally. This did not completely cover his feet. 07/23/2022: This is [name] patient of Dr. Neita Garnet that she  asked me to take [name] look at last week due to the significant involvement of the muscle and tendon on his right posterior leg wound. He needed an aggressive debridement and asked me if I would be able to perform this on her behalf. The patient is here today for that procedure. When his dressing was removed in clinic today, the wound was teeming with maggots. There is necrotic muscle, tendon, and fat, with [name] thick layer of slough on the wound. 9/21; patient presents for follow-up. He was debrided by Dr. Lady Gary at last clinic visit without any issues. He has been placing Dakin's wet-to-dry dressings to the right posterior wound. He has been using Medihoney to the left foot wound. He reports improvement in wound healing. He has no issues or complaints today. 9/28; patient with type 2 diabetes PAD status post revascularization. He underwent retrograde right PTA angioplasty. Last saw Dr. Chestine Spore on 9/12 and was felt to have [name] brisk posterior tibial signal at the right ankle. He had [name] pulsatile toe tracing that appeared adequate for healing. He has been using Santyl and Hydrofera Blue on the right Achilles area. He has [name] smaller area on the left fifth plantar metatarsal head They were apparently in the ER on 9/23 with swelling and discoloration above the wrap. They have [name] picture of the leg after the wrap was taken off which looks like that it was excessively tight superiorly 10/6; patient presents for follow-up. We have been using  Santyl and Hydrofera Blue to the right lower extremity wound under Kerlix/Coban. T the left foot we o have been using silver alginate with Medihoney. He again comes in with no shoes. He has no issues or complaints today. 10/12; patient presents for follow-up. We have been using Santyl and Hydrofera Blue to the right lower extremity under Kerlix/Coban And silver alginate to the left foot wound. He has no issues today. He denies signs of infection. 10/19; patient presents for follow-up. We continue to use Santyl and Hydrofera Blue to the right lower extremity under Kerlix/Coban and silver alginate to the left foot wound. There is been improvement in wound healing. 10/26; patient presents for follow-up. We have been using Santyl and Hydrofera Blue to the right lower extremity under Kerlix/Coban and silver alginate to the left foot. There continues to be improvement in wound healing. Patient has no issues or complaints today. 11/2; the patient's area on the right Achilles heel looks quite healthy and is improved in terms of measurements. We have been using Hydrofera Blue. He is approved for Grafix On the left plantar foot wound over the fifth metatarsal head this tunnels over the lateral part of the met head. He is not offloading this at all 11/16; patient presents for follow-up. He has been using [name] surgical shoe with an offloading felt pad to the left lateral foot along with silver alginate. He has been approved for Grafix and this is available for placement today T the right lower extremity wound. Patient Is agreeable to this. We have been using Santyl and o Hydrofera Blue under Kerlix/Coban to this area. 11/21; patient presents for follow-up. We have been doing Grafix to the right lower extremity and silver alginate to the left foot wound. He has no issues or complaints today. ZYIERE, BODKINS [name] ([id number]) [id number] Page 3 of 13 11/30; patient presents for follow-up.  We have been doing Grafix to the right lower extremity wound and Medihoney with Hydrofera Blue to the left lateral foot wound. He reports pain  to the left foot wound today. He did not obtain his x-ray. 12/11; patient presents for follow-up. He has been using Dakin's wet-to-dry dressings to the left plantar foot wound. We have been doing Grafix to the right lower extremity wound under compression therapy. He obtained his x-ray that showed mild periosteal elevation at the resection site with the possibility of osteomyelitis. He currently denies systemic signs of infection. He has been taking doxycycline and Augmentin without issues. He is getting his INR checked by his primary care office. 12/18; patient presents for follow-up. He has been using Dakin's wet-to-dry dressings to the left foot wound. We have been placing Grafix under compression therapy to the right lower extremity. He is currently taking doxycycline and Augmentin. He denies signs of infection. 12/26; patient presents for follow-up. He has been using Dakin's wet-to-dry dressings to the left foot. He has been off oral antibiotics for potential bone biopsy. We have been placing Grafix under compression therapy to the right lower extremity. There has been improvement in wound healing to both sites. He states he is trying to aggressively offload the left foot. He currently denies signs of infection. 1/2; patient presents for follow-up. He has been doing Dakin's wet-to-dry dressings to the left foot. He saw Dr. Ralene Cork today and had [name] bone biopsy. Debridement was performed on the site. Dressing in place with Dakin's wet-to-dry. T the right leg we have been using Grafix under compression therapy. He o has no issues or complaints today. 1/9; patient presents for follow-up. We have been using Hydrofera Blue under compression therapy to the right lower extremity wound. This is well-healing. He has been using Dakin's wet-to-dry dressings to the left  lateral foot wound. He has been taking ciprofloxacin due to culture results without issues. He was referred to infectious disease by podiatry. He does not have an appointment yet. 1/16; patient presents for follow-up. We were using collagen under Kerlix/Coban to the right lower extremity wound. He has been approved for Grafix and this was available for placement today. He continues to use Dakin's wet-to-dry dressings daily to the left foot. He started levofloxacin. He has an appointment with infectious disease on 1/24. 1/23; patient presents for follow-up. Grafix was placed in standard fashion to the right lower extremity wound last week. He has been using Dakin's wet-to-dry dressings to the left foot. He has been taking levofloxacin. He has an infectious disease appointment tomorrow as well as [name] pulmonology appointment. He followed up with vein and vascular on 1/16 and plan is for angiogram of the left lower extremity. He has [name] history of left peroneal angioplasty in 2022. 1/30; patient presents for follow-up. Grafix was placed in standard fashion to the right lower extremity wound last week. There has been improvement in healing here. He has been using Dakin's wet-to-dry dressings to the left foot. Patient saw infectious disease, Dr. Earlene Plater on 1/24 and plan is for PICC line on 2/5 with cefepime. For now he is taking levofloxacin. He has been cleared by pulmonology for HBO. Due to extensive heart history we have asked HBO clearance from his cardiologist as well. He has follow-up with them tomorrow. He currently has no issues or complaints today. 2/6; patient presents for follow-up. He started HBO therapy today and had no issues with his dive. He had an arteriogram on 2/1 by Dr. Chestine Spore. He had angioplasty of the left peroneal artery. He is optimized from [name] vascular standpoint. We have been using Grafix to the right lower extremity wound and  Dakin's wet-to-dry dressings to the left foot wound. He  started his IV antibiotics yesterday. 2/13; patient presents for follow-up. We have been using Grafix to the right lower extremity wound under compression therapy and Dakin's wet-to-dry dressings to the left lateral wound. He is continued IV antibiotics without issues. 2/20; Patient presents for follow-up. We have been using Grafix to the right lower extremity wound under compression and Dakin's wet-to-dry dressings to the left lateral foot wound. Per patient since he cannot do two courses of IV antibiotics daily plan is to switch to oral antibiotics. Patient has not done HBO since 2/15. Plan is to be evaluated by ophthalmology to assure there are no issues with his vision. 2/27; patient presents for follow-up. We have been using Grafix to the right lower extremity under compression therapy and Dakin's wet-to-dry dressings to the left lateral foot wound. He has switched over to oral antibiotics for the left foot with chronic osteomyelitis. He is scheduled to see ophthalmology later this week to be cleared for HBO. 3/7; patient presents for follow-up. We have been using Hydrofera Blue under Kerlix/Coban to the right lower extremity. We have been using Dakin's wet-to-dry dressings to the left lateral foot. This wound has healed. 3/14; patient presents for follow-up. We have been using endoform with antibiotic ointment under Kerlix/Coban to the right lower extremity. His wound is healed. 02/23/2023 Patient was last seen 1 month ago. At that time as his wounds were healed. He presents today because he is concerned about the posterior right leg reopening. He had dried lymph fluid that has come off and now there is moisture to the skin. There is only [name] couple very small areas that have some skin breakdown but overall the area is still mostly epithelialized. He has been keeping the area covered. 5/6; see patient who has [name] reopened wound in the posterior distal right lower leg just above the heel area. He has  severe venous insufficiency however only [name] small open area remains. We have been using silver alginate and foam the open wound area has been making good progress. 5/20; patient presents for follow-up. He has been using silver alginate with foam border dressing to the right posterior leg wound along with his compression stocking. He has no issues or complaints today. Small area still remains open. 6/6; right Achilles wound. He has been using silver alginate [name] border foam and Tubigrip. Comes in today with callus around this wound area. They are also concerned about callus on the remanent of wound on the left plantar foot fifth met head 6/20; He has been using silver alginate to the Achilles wound. The wound is smaller today. Reexamined previous wound site on the left plantar foot fifth met head and this has remained closed. 7/11; patient presents for follow-up. He has been using silver alginate to the posterior right leg wound. Wound is just much smaller. He has no issues or complaints today. READMISSION 11/25/2023 ***ABIs 06/2023: -------+-----------+-----------+------------+------------+ ABI/TBIT oday's ABIT oday's TBIPrevious ABIPrevious TBI +-------+-----------+-----------+------------+------------+ Right 0.84 0.36 0.86 0.35  +-------+-----------+-----------+------------+------------+ Left 1.07 0.68 0.97 0.55  +-------+-----------+-----------+------------+------------+ Bilateral ABIs appear essentially unchanged compared to prior study on 03/23/23. Summary: Right: Resting right ankle-brachial index indicates mild right lower KONNER, BILSKI [name] ([id number]) [id number] Page 4 of 13 extremity arterial disease. The right toe-brachial index is abnormal. Left: Resting left ankle-brachial index is within normal range. The left toe-brachial index is abnormal. Last seen in our clinic about 6 months ago, he returns with re-opening of the right Achilles  wound. His daughter reports that ultimately, the wound did close, but then she stopped covering it and stopped having him wear Tubigrip. As result, the ulcer reopened. Fortunately, it is fairly superficial and there is no tendon exposure. Electronic Signature(s) Signed: 11/25/2023 10:09:04 AM By: Duanne Guess MD FACS Previous Signature: 11/25/2023 9:57:32 AM Version By: Duanne Guess MD FACS Entered By: Duanne Guess on 11/25/2023 10:09:04 -------------------------------------------------------------------------------- Physical Exam Details Patient Name: Date of Service: [name] [name] 11/25/2023 9:00 [name]: [id number] Patient Account Number: [id number] Date of Birth/Sex: Treating RN: 1951/09/12 (73 y.o. M) Primary Care Provider: Richarda Blade Other Clinician: Referring Provider: Treating Provider/Extender: Jason Nest, [name] in Treatment: 0 Constitutional Hypertensive, asymptomatic. . . . No acute distress. Respiratory Normal work of breathing on room air. Cardiovascular 2+ pitting edema. Notes 11/25/2023: On his posterior ankle, overlying the Achilles area, there is [name] linear ulcer with slough accumulation. No concern for infection. Electronic Signature(s) Signed: 11/25/2023 10:11:01 AM By: Duanne Guess MD FACS Entered By: Duanne Guess on 11/25/2023 10:11:01 -------------------------------------------------------------------------------- Physician Orders Details Patient Name: Date of Service: [name] [name] [name] 11/25/2023 9:00 [name]: [id number] Patient Account Number: [id number] Date of Birth/Sex: Treating RN: January 31, 1951 (73 y.o. [name] Primary Care Provider: Richarda Blade Other Clinician: Referring Provider: Treating Provider/Extender: Pleas Patricia in Treatment: 0 The following information was scribed by: Zenaida Deed The information was scribed for:  Duanne Guess Verbal / Phone Orders: No Diagnosis Coding ICD-10 Coding Code Description L97.312 Non-pressure chronic ulcer of right ankle with fat layer exposed E11.622 Type 2 diabetes mellitus with other skin ulcer I73.9 Peripheral vascular disease, unspecified KWESI, SLICER [name] ([id number]) [phone].pdf Page 5 of 13 Follow-up Appointments ppointment in 2 weeks. - Dr. Lady Gary Return [name] Anesthetic (In clinic) Topical Lidocaine 4% applied to wound bed Bathing/ Shower/ Hygiene May shower and wash wound with soap and water. - with dressing changes Off-Loading Other: - Place pillow behind calf when lying/sitting to float heels. Wear PEG Assist shoe Wound Treatment Wound #5 - Lower Leg Wound Laterality: Right, Posterior Peri-Wound Care: Sween Lotion (Moisturizing lotion) Every Other Day/30 Days Discharge Instructions: Apply moisturizing lotion as directed Prim Dressing: Maxorb Extra Ag+ Alginate Dressing, 2x2 (in/in) (DME) (Dispense As Written) Every Other Day/30 Days ary Discharge Instructions: Apply to wound bed as instructed Secondary Dressing: Zetuvit Plus Silicone Border Dressing 4x4 (in/in) (DME) (Dispense As Written) Every Other Day/30 Days Discharge Instructions: Apply silicone border over primary dressing as directed. Secured With: [employer], 4.5x3.1 (in/yd) (DME) (Generic) Every Other Day/30 Days Discharge Instructions: Secure with Kerlix as directed. Secured With: Paper Tape, 2x10 (in/yd) Every Other Day/30 Days Discharge Instructions: Secure dressing with tape as directed. Secured With: Tubigrip Size D, 3x10 (in/yd) (DME) (Generic) Every Other Day/30 Days Electronic Signature(s) Signed: 11/25/2023 10:20:49 AM By: Duanne Guess MD FACS Signed: 11/25/2023 4:58:03 PM By: Zenaida Deed RN, BSN Previous Signature: 11/25/2023 10:11:16 AM Version By: Duanne Guess MD FACS Entered By: Zenaida Deed on 11/25/2023  10:14:23 -------------------------------------------------------------------------------- Problem List Details Patient Name: Date of Service: [name] [facility] [name]. 11/25/2023 9:00 [name]: [id number] Patient Account Number: [id number] Date of Birth/Sex: Treating RN: 06/20/1951 (73 y.o. M) Primary Care Provider: Richarda Blade Other Clinician: Referring Provider: Treating Provider/Extender: Jason Nest, [name] in Treatment: 0 Active Problems ICD-10 Encounter Code Description Active Date MDM Diagnosis L97.312 Non-pressure chronic ulcer of right ankle with fat layer  exposed 11/25/2023 No Yes E11.622 Type 2 diabetes mellitus with other skin ulcer 11/25/2023 No Yes I73.9 Peripheral vascular disease, unspecified 11/25/2023 No Yes LUDGER, DARGENIO [name] ([id number]) [id number] Page 6 of 13 Inactive Problems Resolved Problems Electronic Signature(s) Signed: 11/25/2023 9:50:17 AM By: Duanne Guess MD FACS Previous Signature: 11/25/2023 9:34:52 AM Version By: Duanne Guess MD FACS Previous Signature: 11/25/2023 9:34:03 AM Version By: Duanne Guess MD FACS Entered By: Duanne Guess on 11/25/2023 09:50:16 -------------------------------------------------------------------------------- Progress Note Details Patient Name: Date of Service: [name] [name] 11/25/2023 9:00 [name]: [id number] Patient Account Number: [id number] Date of Birth/Sex: Treating RN: 08/28/1951 (73 y.o. M) Primary Care Provider: Richarda Blade Other Clinician: Referring Provider: Treating Provider/Extender: Jason Nest, [name] in Treatment: 0 Subjective Chief Complaint Information obtained from Patient 07/16/2022; bilateral lower extremity wounds 1.16.2025: returns with re-opening of right Achilles wound History of Present Illness (HPI) Admission 07/16/2022 Mr. 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 He is not sure how the wounds started. He has been following with podiatry for this issue. He had removal of the tendon to the right lower extremity by Dr. Ralene Cork. He has been using Betadine to the wound beds. On 06/11/2022 he had [name] right posterior tibial artery angioplasty by Dr. Chestine Spore. It was reported the patient is optimized after revascularization of the right lower extremity. His ABIs on the left were 0.91. He currently denies systemic signs of infection. He also does not wear shoes. He came in with Kerlix wrap to his feet bilaterally. This did not completely cover his feet. 07/23/2022: This is [name] patient of Dr. Neita Garnet that she asked me to take [name] look at last week due to the significant involvement of the muscle and tendon on his right posterior leg wound. He needed an aggressive debridement and asked me if I would be able to perform this on her behalf. The patient is here today for that procedure. When his dressing was removed in clinic today, the wound was teeming with maggots. There is necrotic muscle, tendon, and fat, with [name] thick layer of slough on the wound. 9/21; patient presents for follow-up. He was debrided by Dr. Lady Gary at last clinic visit without any issues. He has been placing Dakin's wet-to-dry dressings to the right posterior wound. He has been using Medihoney to the left foot wound. He reports improvement in wound healing. He has no issues or complaints today. 9/28; patient with type 2 diabetes PAD status post revascularization. He underwent retrograde right PTA angioplasty. Last saw Dr. Chestine Spore on 9/12 and was felt  to have [name] brisk posterior tibial signal at the right ankle. He had [name] pulsatile toe tracing that appeared adequate for healing. He has been using Santyl and Hydrofera Blue on the right Achilles area.  He has [name] smaller area on the left fifth plantar metatarsal head They were apparently in the ER on 9/23 with swelling and discoloration above the wrap. They have [name] picture of the leg after the wrap was taken off which looks like that it was excessively tight superiorly 10/6; patient presents for follow-up. We have been using Santyl and Hydrofera Blue to the  right lower extremity wound under Kerlix/Coban. T the left foot we o have been using silver alginate with Medihoney. He again comes in with no shoes. He has no issues or complaints today. 10/12; patient presents for follow-up. We have been using Santyl and Hydrofera Blue to the right lower extremity under Kerlix/Coban And silver alginate to the left foot wound. He has no issues today. He denies signs of infection. 10/19; patient presents for follow-up. We continue to use Santyl and Hydrofera Blue to the right lower extremity under Kerlix/Coban and silver alginate to the left foot wound. There is been improvement in wound healing. 10/26; patient presents for follow-up. We have been using Santyl and Hydrofera Blue to the right lower extremity under Kerlix/Coban and silver alginate to the left foot. There continues to be improvement in wound healing. Patient has no issues or complaints today. 11/2; the patient's area on the right Achilles heel looks quite healthy and is improved in terms of measurements. We have been using Hydrofera Blue. He is approved for Grafix On the left plantar foot wound over the fifth metatarsal head this tunnels over the lateral part of the met head. He is not offloading this at all 11/16; patient presents for follow-up. He has been using [name] surgical shoe with an offloading felt pad to the left lateral foot along with silver  alginate. He has been approved for Grafix and this is available for placement today T the right lower extremity wound. Patient Is agreeable to this. We have been using Santyl and o Hydrofera Blue under Kerlix/Coban to this area. 11/21; patient presents for follow-up. We have been doing Grafix to the right lower extremity and silver alginate to the left foot wound. He has no issues or complaints today. LUCKAS, PORRO [name] ([id number]) [id number] Page 7 of 13 11/30; patient presents for follow-up. We have been doing Grafix to the right lower extremity wound and Medihoney with Hydrofera Blue to the left lateral foot wound. He reports pain to the left foot wound today. He did not obtain his x-ray. 12/11; patient presents for follow-up. He has been using Dakin's wet-to-dry dressings to the left plantar foot wound. We have been doing Grafix to the right lower extremity wound under compression therapy. He obtained his x-ray that showed mild periosteal elevation at the resection site with the possibility of osteomyelitis. He currently denies systemic signs of infection. He has been taking doxycycline and Augmentin without issues. He is getting his INR checked by his primary care office. 12/18; patient presents for follow-up. He has been using Dakin's wet-to-dry dressings to the left foot wound. We have been placing Grafix under compression therapy to the right lower extremity. He is currently taking doxycycline and Augmentin. He denies signs of infection. 12/26; patient presents for follow-up. He has been using Dakin's wet-to-dry dressings to the left foot. He has been off oral antibiotics for potential bone biopsy. We have been placing Grafix under compression therapy to the right lower extremity. There has been improvement in wound healing to both sites. He states he is trying to aggressively offload the left foot. He currently denies signs of infection. 1/2; patient presents  for follow-up. He has been doing Dakin's wet-to-dry dressings to the left foot. He saw Dr. Ralene Cork today and had [name] bone biopsy. Debridement was performed on the site. Dressing in place with Dakin's wet-to-dry. T the right leg we have been using Grafix under compression therapy. He o has no issues or complaints today. 1/9; patient presents for  follow-up. We have been using Hydrofera Blue under compression therapy to the right lower extremity wound. This is well-healing. He has been using Dakin's wet-to-dry dressings to the left lateral foot wound. He has been taking ciprofloxacin due to culture results without issues. He was referred to infectious disease by podiatry. He does not have an appointment yet. 1/16; patient presents for follow-up. We were using collagen under Kerlix/Coban to the right lower extremity wound. He has been approved for Grafix and this was available for placement today. He continues to use Dakin's wet-to-dry dressings daily to the left foot. He started levofloxacin. He has an appointment with infectious disease on 1/24. 1/23; patient presents for follow-up. Grafix was placed in standard fashion to the right lower extremity wound last week. He has been using Dakin's wet-to-dry dressings to the left foot. He has been taking levofloxacin. He has an infectious disease appointment tomorrow as well as [name] pulmonology appointment. He followed up with vein and vascular on 1/16 and plan is for angiogram of the left lower extremity. He has [name] history of left peroneal angioplasty in 2022. 1/30; patient presents for follow-up. Grafix was placed in standard fashion to the right lower extremity wound last week. There has been improvement in healing here. He has been using Dakin's wet-to-dry dressings to the left foot. Patient saw infectious disease, Dr. Earlene Plater on 1/24 and plan is for PICC line on 2/5 with cefepime. For now he is taking levofloxacin. He has been cleared by pulmonology for HBO. Due  to extensive heart history we have asked HBO clearance from his cardiologist as well. He has follow-up with them tomorrow. He currently has no issues or complaints today. 2/6; patient presents for follow-up. He started HBO therapy today and had no issues with his dive. He had an arteriogram on 2/1 by Dr. Chestine Spore. He had angioplasty of the left peroneal artery. He is optimized from [name] vascular standpoint. We have been using Grafix to the right lower extremity wound and Dakin's wet-to-dry dressings to the left foot wound. He started his IV antibiotics yesterday. 2/13; patient presents for follow-up. We have been using Grafix to the right lower extremity wound under compression therapy and Dakin's wet-to-dry dressings to the left lateral wound. He is continued IV antibiotics without issues. 2/20; Patient presents for follow-up. We have been using Grafix to the right lower extremity wound under compression and Dakin's wet-to-dry dressings to the left lateral foot wound. Per patient since he cannot do two courses of IV antibiotics daily plan is to switch to oral antibiotics. Patient has not done HBO since 2/15. Plan is to be evaluated by ophthalmology to assure there are no issues with his vision. 2/27; patient presents for follow-up. We have been using Grafix to the right lower extremity under compression therapy and Dakin's wet-to-dry dressings to the left lateral foot wound. He has switched over to oral antibiotics for the left foot with chronic osteomyelitis. He is scheduled to see ophthalmology later this week to be cleared for HBO. 3/7; patient presents for follow-up. We have been using Hydrofera Blue under Kerlix/Coban to the right lower extremity. We have been using Dakin's wet-to-dry dressings to the left lateral foot. This wound has healed. 3/14; patient presents for follow-up. We have been using endoform with antibiotic ointment under Kerlix/Coban to the right lower extremity. His wound is  healed. 02/23/2023 Patient was last seen 1 month ago. At that time as his wounds were healed. He presents today because he is concerned about the  posterior right leg reopening. He had dried lymph fluid that has come off and now there is moisture to the skin. There is only [name] couple very small areas that have some skin breakdown but overall the area is still mostly epithelialized. He has been keeping the area covered. 5/6; see patient who has [name] reopened wound in the posterior distal right lower leg just above the heel area. He has severe venous insufficiency however only [name] small open area remains. We have been using silver alginate and foam the open wound area has been making good progress. 5/20; patient presents for follow-up. He has been using silver alginate with foam border dressing to the right posterior leg wound along with his compression stocking. He has no issues or complaints today. Small area still remains open. 6/6; right Achilles wound. He has been using silver alginate [name] border foam and Tubigrip. Comes in today with callus around this wound area. They are also concerned about callus on the remanent of wound on the left plantar foot fifth met head 6/20; He has been using silver alginate to the Achilles wound. The wound is smaller today. Reexamined previous wound site on the left plantar foot fifth met head and this has remained closed. 7/11; patient presents for follow-up. He has been using silver alginate to the posterior right leg wound. Wound is just much smaller. He has no issues or complaints today. READMISSION 11/25/2023 ***ABIs 06/2023: -------+-----------+-----------+------------+------------+ ABI/TBIT oday's ABIT oday's TBIPrevious ABIPrevious TBI +-------+-----------+-----------+------------+------------+ Right 0.84 0.36 0.86 0.35  +-------+-----------+-----------+------------+------------+ Left 1.07 0.68 0.97 0.55   +-------+-----------+-----------+------------+------------+ Bilateral ABIs appear essentially unchanged compared to prior study on 03/23/23. Summary: Right: Resting right ankle-brachial index indicates mild right lower CLEVEN, KONDA [name] ([id number]) [id number] Page 8 of 13 extremity arterial disease. The right toe-brachial index is abnormal. Left: Resting left ankle-brachial index is within normal range. The left toe-brachial index is abnormal. Last seen in our clinic about 6 months ago, he returns with re-opening of the right Achilles wound. His daughter reports that ultimately, the wound did close, but then she stopped covering it and stopped having him wear Tubigrip. As result, the ulcer reopened. Fortunately, it is fairly superficial and there is no tendon exposure. Patient History Information obtained from Patient, Chart. Allergies No Known Drug Allergies Family History Diabetes - Mother, Heart Disease - Mother,Siblings, No family history of Cancer, Hereditary Spherocytosis, Hypertension, Kidney Disease, Lung Disease, Seizures, Stroke, Thyroid Problems, Tuberculosis. Social History Former smoker - quit 1 yr ago, Marital Status - Divorced, Alcohol Use - Never, Drug Use - No History, Caffeine Use - Never. Medical History Eyes Denies history of Cataracts, Glaucoma, Optic Neuritis Ear/Nose/Mouth/Throat Denies history of Chronic sinus problems/congestion, Middle ear problems Respiratory Patient has history of Sleep Apnea - does not wear Cardiovascular Patient has history of Arrhythmia - [name]. Fib, Congestive Heart Failure - EF 25%, Hypertension, Peripheral Arterial Disease Denies history of Peripheral Venous Disease Endocrine Patient has history of Type II Diabetes Denies history of Type I Diabetes Genitourinary Denies history of End Stage Renal Disease Musculoskeletal Patient has history of Osteoarthritis, Osteomyelitis - left  foot Neurologic Patient has history of Neuropathy Oncologic Denies history of Received Chemotherapy, Received Radiation Psychiatric Denies history of Anorexia/bulimia, Confinement Anxiety Patient is treated with Insulin, Oral Agents. Blood sugar is not tested. Hospitalization/Surgery History - Angiogram 06/11/2022 Dr. Chestine Spore [facility]. - cardioversion 11/06/2021, 2015, 2017. 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 Medical [name] Surgical History Notes nd Eyes legally blind, macular degeneration Gastrointestinal Ascites Genitourinary stage III Kidney  disease Psychiatric ADHD Review of Systems (ROS) Constitutional Symptoms (General Health) Denies complaints or symptoms of Fatigue, Fever, Chills, Marked Weight Change. Eyes Denies complaints or symptoms of Dry Eyes, Vision Changes, Glasses / Contacts. Ear/Nose/Mouth/Throat Denies complaints or symptoms of Chronic sinus problems or rhinitis. Cardiovascular Denies complaints or symptoms of Chest pain. Gastrointestinal Denies complaints or symptoms of Frequent diarrhea, Nausea, Vomiting. Endocrine Denies complaints or symptoms of Heat/cold intolerance. Genitourinary Denies complaints or symptoms of Frequent urination. Integumentary (Skin) Complains or has symptoms of Wounds - right achilles. Musculoskeletal Denies complaints or symptoms of Muscle Pain, Muscle Weakness. Neurologic Complains or has symptoms of Numbness/parasthesias. Psychiatric Denies complaints or symptoms of Claustrophobia. KEION, CRINER [name] ([id number]) [id number] Page 9 of 13 Objective Constitutional Hypertensive, asymptomatic. No acute distress. Vitals Time Taken: 9:21 AM, Height: 68 in, Weight: 205 lbs, BMI: 31.2, Temperature: 97.8 F, Pulse: 85 bpm, Respiratory Rate: 18 breaths/min, Blood Pressure: 175/84 mmHg. Respiratory Normal work of breathing on room air. Cardiovascular 2+ pitting edema. General Notes: 11/25/2023: On his posterior  ankle, overlying the Achilles area, there is [name] linear ulcer with slough accumulation. No concern for infection. Integumentary (Hair, Skin) Wound #5 status is Open. Original cause of wound was Gradually Appeared. The date acquired was: 09/27/2023. The wound is located on the Right,Posterior Lower Leg. The wound measures 3cm length x 0.9cm width x 0.1cm depth; 2.121cm^2 area and 0.212cm^3 volume. There is Fat Layer (Subcutaneous Tissue) exposed. There is no tunneling or undermining noted. There is [name] medium amount of serosanguineous drainage noted. The wound margin is flat and intact. There is large (67-100%) red granulation within the wound bed. There is [name] small (1-33%) amount of necrotic tissue within the wound bed including Adherent Slough. The periwound skin appearance had no abnormalities noted for texture. The periwound skin appearance had no abnormalities noted for moisture. The periwound skin appearance exhibited: Hemosiderin Staining. Periwound temperature was noted as No Abnormality. Assessment Active Problems ICD-10 Non-pressure chronic ulcer of right ankle with fat layer exposed Type 2 diabetes mellitus with other skin ulcer Peripheral vascular disease, unspecified Procedures Wound #5 Pre-procedure diagnosis of Wound #5 is [name] Diabetic Wound/Ulcer of the Lower Extremity located on the Right,Posterior Lower Leg .Severity of Tissue Pre Debridement is: Fat layer exposed. There was [name] Excisional Skin/Subcutaneous Tissue Debridement with [name] total area of 2.12 sq cm performed by Duanne Guess, MD. With the following instrument(s): Curette to remove Viable and Non-Viable tissue/material. Material removed includes Subcutaneous Tissue and Slough and after achieving pain control using Lidocaine 4% T opical Solution. No specimens were taken. [name] time out was conducted at 10:00, prior to the start of the procedure. [name] Minimum amount of bleeding was controlled with Pressure. The procedure was tolerated  well with [name] pain level of 0 throughout and [name] pain level of 0 following the procedure. Post Debridement Measurements: 3cm length x 0.9cm width x 0.1cm depth; 0.212cm^3 volume. Character of Wound/Ulcer Post Debridement is improved. Severity of Tissue Post Debridement is: Fat layer exposed. Post procedure Diagnosis Wound #5: Same as Pre-Procedure Plan Follow-up Appointments: Return Appointment in 2 weeks. - Dr. Lady Gary Anesthetic: (In clinic) Topical Lidocaine 4% applied to wound bed Bathing/ Shower/ Hygiene: May shower and wash wound with soap and water. - with dressing changes Off-Loading: Other: - Place pillow behind calf when lying/sitting to float heels. Wear PEG Assist shoe WOUND #5: - Lower Leg Wound Laterality: Right, Posterior Peri-Wound Care: Sween Lotion (Moisturizing lotion) Every Other Day/30 Days Discharge Instructions: Apply moisturizing lotion as directed  Prim Dressing: Maxorb Extra Ag+ Alginate Dressing, 2x2 (in/in) (DME) (Dispense As Written) Every Other Day/30 Days ary Discharge Instructions: Apply to wound bed as instructed Secondary Dressing: Zetuvit Plus Silicone Border Dressing 4x4 (in/in) (DME) (Dispense As Written) Every Other Day/30 Days Discharge Instructions: Apply silicone border over primary dressing as directed. Secured With: [employer], 4.5x3.1 (in/yd) (DME) (Generic) Every Other Day/30 Days Discharge Instructions: Secure with Kerlix as directed. Secured With: Paper T ape, 2x10 (in/yd) Every Other Day/30 Days Discharge Instructions: Secure dressing with tape as directed. Secured With: Tubigrip Size D, 3x10 (in/yd) (DME) (Generic) Every Other Day/30 Days JAP, HAASCH [name] ([id number]) [id number] Page 10 of 13 11/25/2023: On his posterior ankle, overlying the Achilles area, there is [name] linear ulcer with slough accumulation. No concern for infection. I used [name] curette to debride slough and subcutaneous tissue from the wound. We  will use silver alginate under [name] foam border dressing with Tubigrip for compression. Follow-up in 2 weeks. Electronic Signature(s) Signed: 11/25/2023 12:44:34 PM By: Duanne Guess MD FACS Signed: 11/25/2023 4:58:03 PM By: Zenaida Deed RN, BSN Previous Signature: 11/25/2023 10:11:46 AM Version By: Duanne Guess MD FACS Entered By: Zenaida Deed on 11/25/2023 11:50:44 -------------------------------------------------------------------------------- HxROS Details Patient Name: Date of Service: [name] [name] 11/25/2023 9:00 [name]: [id number] Patient Account Number: [id number] Date of Birth/Sex: Treating RN: 06-Apr-1951 (73 y.o. [name] Primary Care Provider: Richarda Blade Other Clinician: Referring Provider: Treating Provider/Extender: Jason Nest, [name] in Treatment: 0 Information Obtained From Patient Chart Constitutional Symptoms (General Health) Complaints and Symptoms: Negative for: Fatigue; Fever; Chills; Marked Weight Change Eyes Complaints and Symptoms: Negative for: Dry Eyes; Vision Changes; Glasses / Contacts Medical History: Negative for: Cataracts; Glaucoma; Optic Neuritis Past Medical History Notes: legally blind, macular degeneration Ear/Nose/Mouth/Throat Complaints and Symptoms: Negative for: Chronic sinus problems or rhinitis Medical History: Negative for: Chronic sinus problems/congestion; Middle ear problems Cardiovascular Complaints and Symptoms: Negative for: Chest pain Medical History: Positive for: Arrhythmia - [name]. Fib; Congestive Heart Failure - EF 25%; Hypertension; Peripheral Arterial Disease Negative for: Peripheral Venous Disease Gastrointestinal Complaints and Symptoms: Negative for: Frequent diarrhea; Nausea; Vomiting Medical History: Past Medical History Notes: Ascites Endocrine Complaints and Symptoms: Negative for: Heat/cold intolerance Medical HistoryMarland Kitchen BILLION, MCVICKER  ([id number]) [id number] Page 11 of 13 Positive for: Type II Diabetes Negative for: Type I Diabetes Time with diabetes: 6 years Treated with: Insulin, Oral agents Blood sugar tested every day: No Genitourinary Complaints and Symptoms: Negative for: Frequent urination Medical History: Negative for: End Stage Renal Disease Past Medical History Notes: stage III Kidney disease Integumentary (Skin) Complaints and Symptoms: Positive for: Wounds - right achilles Musculoskeletal Complaints and Symptoms: Negative for: Muscle Pain; Muscle Weakness Medical History: Positive for: Osteoarthritis; Osteomyelitis - left foot Neurologic Complaints and Symptoms: Positive for: Numbness/parasthesias Medical History: Positive for: Neuropathy Psychiatric Complaints and Symptoms: Negative for: Claustrophobia Medical History: Negative for: Anorexia/bulimia; Confinement Anxiety Past Medical History Notes: ADHD Hematologic/Lymphatic Respiratory Medical History: Positive for: Sleep Apnea - does not wear Immunological Oncologic Medical History: Negative for: Received Chemotherapy; Received Radiation Immunizations Pneumococcal Vaccine: Received Pneumococcal Vaccination: No Implantable Devices No devices added Hospitalization / Surgery History Type of Hospitalization/Surgery Angiogram 06/11/2022 Dr. 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 In the past 2 months, did you or others you live with eat smaller meals or skip meals because you didn't have money for foodo : No 2. Are you homeless or worried that you might be in the futureo : No 3. Do you have trouble paying for your utilities (gas, electricity, phone)o : No 4. Do you have trouble finding or paying for [name] rideo : No 5. Do you need daycare, or better daycare, for your kidso : No 6. Are you unemployed or without regular incomeo : No 7. Do you need help finding [name] better jobo : No 8. Do you need help getting more educationo : No 9. Are you concerned about someone in your home using drugs or alcoholo : No 10. Do you feel unsafe in your daily lifeo : No 11. Is anyone in your home threatening or abusing youo : No 12. Do you lack quality relationships that make you feel valued and supportedo : No 13. Do you need help getting cultural information in [name] language you understando : No 14. Do you need help getting internet accesso : No Advanced Directives and Instructions Spiritual or Cultural beliefs preclude asking about Advance Care Planning: No Advanced Directives: No Patient wants information on Advanced Directives: No Do not resuscitate: No Living Will: No Medical Power of Attorney: No Surrogate Decision Maker: No Electronic Signature(s) Signed: 11/25/2023 12:44:34 PM By: Duanne Guess MD FACS Signed: 11/25/2023 4:58:03 PM By: Zenaida Deed RN, BSN Previous Signature: 11/25/2023 10:20:49 AM Version By: Duanne Guess MD FACS Entered By: Zenaida Deed on 11/25/2023 11:50:29 -------------------------------------------------------------------------------- SuperBill Details Patient Name: Date of Service: [name] [facility] [name]. 11/25/2023 Medical Record Number: [id number] Patient Account Number: [id number] Date of Birth/Sex: Treating RN: October 27, 1951 (73 y.o. M) Primary Care Provider: Richarda Blade Other Clinician: Referring Provider: Treating  Provider/Extender: Jason Nest, [name] in Treatment: 0 Diagnosis Coding ICD-10 Codes Code Description [phone] Non-pressure chronic ulcer of right ankle with fat layer exposed E11.622 Type 2 diabetes mellitus with other skin ulcer I73.9 Peripheral vascular disease, unspecified Facility Procedures : CPT4 Code: [id number] Description: 11042 - DEB SUBQ TISSUE 20 SQ CM/< ICD-10 Diagnosis Description L97.312 Non-pressure chronic ulcer of right ankle with fat layer exposed Modifier: Quantity: 1 Physician Procedures : CPT4 Code Description Modifier [id number] - WC PHYS LEVEL 4 - EST PT ICD-10 Diagnosis Description L97.312 Non-pressure chronic ulcer of right ankle with fat layer exposed E11.622 Type 2 diabetes mellitus with other skin ulcer I73.9 Peripheral  vascular disease, unspecified Quantity: 1 : [id number] 11042 - WC PHYS SUBQ TISS 20 SQ CM NEILAN, DOWNHAM [name] ([id number]) [id number] ICD-10 Diagnosis Description L97.312 Non-pressure chronic ulcer of right ankle with fat layer exposed Quantity: 1 7.pdf Page 13 of 13 Electronic Signature(s) Signed: 11/25/2023 10:12:08 AM By: Duanne Guess MD FACS Entered By: Duanne Guess on 11/25/2023 10:12:07

## 2023-11-25 NOTE — Progress Notes (Signed)
SHLOMA, HUGO A ([id number]) [id number] Page 1 of 4 Visit Report for 11/25/2023 Abuse Risk Screen Details Patient Name: Date of Service: [name] [facility] A. 11/25/2023 9:00 A M Medical Record Number: [id number] Patient Account Number: [id number] Date of Birth/Sex: Treating RN: Oct 23, 1951 (73 y.o. [name] Primary Care Desman Polak: Richarda Blade Other Clinician: Referring Kaiyon Hynes: Treating Malakye Nolden/Extender: Jason Nest, [name] in Treatment: 0 Abuse Risk Screen Items Answer ABUSE RISK SCREEN: Has anyone close to you tried to hurt or harm you recentlyo No Do you feel uncomfortable with anyone in your familyo No Has anyone forced you do things that you didnt want to doo No Electronic Signature(s) Signed: 11/25/2023 4:58:03 PM By: Zenaida Deed RN, BSN Entered By: Zenaida Deed on 11/25/2023 09:47:53 -------------------------------------------------------------------------------- Activities of Daily Living Details Patient Name: Date of Service: [name] [facility] A. 11/25/2023 9:00 A M Medical Record Number: [id number] Patient Account Number: [id number] Date of Birth/Sex: Treating RN: 10-19-1951 (73 y.o. [name] Primary Care Kandis Henry: Richarda Blade Other Clinician: Referring Zachary Nole: Treating Jennessa Trigo/Extender: Duanne Guess Ngetich, [name] in Treatment: 0 Activities of Daily Living Items Answer Activities of Daily Living (Please select one for each item) Drive Automobile Not Able T Medications ake Need Assistance Use T elephone Completely Able Care for Appearance Need Assistance Use T oilet Completely Able Bath / Shower Need Assistance Dress Self Need Assistance Feed Self Completely Able Walk Completely Able Get In / Out Bed Completely Able Housework Need Assistance Prepare Meals Need Assistance Handle Money Need Assistance Shop for Self Need Assistance Electronic  Signature(s) Signed: 11/25/2023 4:58:03 PM By: Zenaida Deed RN, BSN Entered By: Zenaida Deed on 11/25/2023 09:48:33 [name] ([id number]) [phone] [id number] Page 2 of 4 -------------------------------------------------------------------------------- Education Screening Details Patient Name: Date of Service: [name] [facility] A. 11/25/2023 9:00 A M Medical Record Number: [id number] Patient Account Number: [id number] Date of Birth/Sex: Treating RN: Oct 11, 1951 (73 y.o. [name] Primary Care Gradie Ohm: Richarda Blade Other Clinician: Referring Casimer Russett: Treating Jaydrian Corpening/Extender: Pleas Patricia in Treatment: 0 Primary Learner Assessed: Patient Learning Preferences/Education Level/Primary Language Learning Preference: Explanation, Demonstration, Printed Material Highest Education Level: College or Above Preferred Language: English Cognitive Barrier Language Barrier: No Translator Needed: No Memory Deficit: No Emotional Barrier: No Cultural/Religious Beliefs Affecting Medical Care: No Physical Barrier Impaired Vision: Yes Glasses Impaired Hearing: No Decreased Hand dexterity: No Knowledge/Comprehension Knowledge Level: High Comprehension Level: High Ability to understand written instructions: High Ability to understand verbal instructions: High Motivation Anxiety Level: Calm Cooperation: Cooperative Education Importance: Acknowledges Need Interest in Health Problems: Asks Questions Perception: Coherent Willingness to Engage in Self-Management High Activities: Readiness to Engage in Self-Management High Activities: Electronic Signature(s) Signed: 11/25/2023 4:58:03 PM By: Zenaida Deed RN, BSN Entered By: Zenaida Deed on 11/25/2023 09:48:38 -------------------------------------------------------------------------------- Fall Risk Assessment Details Patient Name: Date of Service: [name]  [name] 11/25/2023 9:00 A M Medical Record Number: [id number] Patient Account Number: [id number] Date of Birth/Sex: Treating RN: 12-17-1950 (72 y.o. [name] Primary Care Temitope Flammer: Richarda Blade Other Clinician: Referring Graysen Depaula: Treating Kaizlee Carlino/Extender: Jason Nest, [name] in Treatment: 0 Fall Risk Assessment Items Have you had 2 or more falls in the last 12 monthso 0 No MASAO, FALLAH A ([id number]) [phone] [id number] Page 3 of 4 Have you had any fall that resulted in injury in the last 12 monthso 0 No FALLS RISK SCREEN History of falling - immediate or within 3 months 0 No Secondary  diagnosis (Do you have 2 or more medical diagnoseso) 0 No Ambulatory aid None/bed rest/wheelchair/nurse 0 Yes Crutches/cane/walker 15 Yes Furniture 0 No Intravenous therapy Access/Saline/Heparin Lock 0 No Gait/Transferring Normal/ bed rest/ wheelchair 0 Yes Weak (short steps with or without shuffle, stooped but able to lift head while walking, may seek 0 No support from furniture) Impaired (short steps with shuffle, may have difficulty arising from chair, head down, impaired 0 No balance) Mental Status Oriented to own ability 0 No Electronic Signature(s) Signed: 11/25/2023 4:58:03 PM By: Zenaida Deed RN, BSN Entered By: Zenaida Deed on 11/25/2023 09:49:23 -------------------------------------------------------------------------------- Foot Assessment Details Patient Name: Date of Service: [name] [name] 11/25/2023 9:00 A M Medical Record Number: [id number] Patient Account Number: [id number] Date of Birth/Sex: Treating RN: 02/12/1951 (73 y.o. [name] Primary Care Jahne Krukowski: Richarda Blade Other Clinician: Referring Braylynn Lewing: Treating Louay Myrie/Extender: Jason Nest, [name] in Treatment: 0 Foot Assessment Items Site Locations + = Sensation present, - = Sensation absent, C = Callus, U = Ulcer R  = Redness, W = Warmth, M = Maceration, PU = Pre-ulcerative lesion F = Fissure, S = Swelling, D = Dryness Assessment Right: Left: Other Deformity: No No Prior Foot Ulcer: No No Prior Amputation: No No Charcot Joint: No No Ambulatory Status: Ambulatory With Help Assistance Device: RUVIM, ZAVATSKY A ([id number]) [id number] Page 4 of 4 Gait: Steady Electronic Signature(s) Signed: 11/25/2023 4:58:03 PM By: Zenaida Deed RN, BSN Entered By: Zenaida Deed on 11/25/2023 09:52:04 -------------------------------------------------------------------------------- Nutrition Risk Screening Details Patient Name: Date of Service: [name] [facility] A. 11/25/2023 9:00 A M Medical Record Number: [id number] Patient Account Number: [id number] Date of Birth/Sex: Treating RN: [birth year] (73 y.o. [name] Primary Care Shon Mansouri: Richarda Blade Other Clinician: Referring Jourdin Gens: Treating Ramya Vanbergen/Extender: Duanne Guess Ngetich, [name] in Treatment: 0 Height (in): 68 Weight (lbs): 205 Body Mass Index (BMI): 31.2 Nutrition Risk Screening Items Score Screening NUTRITION RISK SCREEN: I have an illness or condition that made me change the kind and/or amount of food I eat 0 No I eat fewer than two meals per day 0 No I eat few fruits and vegetables, or milk products 0 No I have three or more drinks of beer, liquor or wine almost every day 0 No I have tooth or mouth problems that make it hard for me to eat 0 No I don't always have enough money to buy the food I need 0 No I eat alone most of the time 0 No I take three or more different prescribed or over-the-counter drugs a day 1 Yes Without wanting to, I have lost or gained 10 pounds in the last six months 0 No I am not always physically able to shop, cook and/or feed myself 0 No Nutrition Protocols Good Risk Protocol 0 No interventions needed Moderate Risk Protocol High Risk Proctocol Risk  Level: Good Risk Score: 1 Electronic Signature(s) Signed: 11/25/2023 4:58:03 PM By: Zenaida Deed RN, BSN Entered By: Zenaida Deed on 11/25/2023 09:49:45

## 2023-11-25 NOTE — Progress Notes (Signed)
HILLMAN, RUDISILL A ([id number]) [id number] Page 1 of 7 Visit Report for 11/25/2023 Allergy List Details Patient Name: Date of Service: [name] [facility] A. 11/25/2023 9:00 A M Medical Record Number: [id number] Patient Account Number: [id number] Date of Birth/Sex: Treating RN: 14-Feb-1951 (73 y.o. [name] Primary Care Alee Gressman: Richarda Blade Other Clinician: Referring Aidden Markovic: Treating Trystin Terhune/Extender: Jason Nest, [name] in Treatment: 0 Allergies Active Allergies No Known Drug Allergies Allergy Notes Electronic Signature(s) Signed: 11/25/2023 4:58:03 PM By: Zenaida Deed RN, BSN Entered By: Zenaida Deed on 11/25/2023 09:38:52 -------------------------------------------------------------------------------- Arrival Information Details Patient Name: Date of Service: [name] [name] 11/25/2023 9:00 A M Medical Record Number: [id number] Patient Account Number: [id number] Date of Birth/Sex: Treating RN: 29-Aug-1951 (73 y.o. M) Primary Care Sina Sumpter: Richarda Blade Other Clinician: Referring Cia Garretson: Treating Nataleigh Griffin/Extender: Jason Nest, [name] in Treatment: 0 Visit Information Patient Arrived: Cane Arrival Time: 09:21 Accompanied By: daughter Transfer Assistance: None Patient Identification Verified: Yes Secondary Verification Process Completed: Yes Patient Requires Transmission-Based Precautions: No Patient Has Alerts: Yes Patient Alerts: R ABI= .84, TBI= .36 L ABI= 1.07, TBI= .68 History Since Last Visit Added or deleted any medications: No Any new allergies or adverse reactions: No Had a fall or experienced change in activities of daily living that may affect risk of falls: No Signs or symptoms of abuse/neglect since last visito No Hospitalized since last visit: No Implantable device outside of the clinic excluding cellular tissue based products placed in the center since last visit:  No Has Dressing in Place as Prescribed: Yes Electronic Signature(s) Signed: 11/25/2023 4:58:03 PM By: Zenaida Deed RN, BSN Entered By: Zenaida Deed on 11/25/2023 09:58:20 [name] A ([id number]) [id number] Page 2 of 7 -------------------------------------------------------------------------------- Encounter Discharge Information Details Patient Name: Date of Service: [name] [facility] A. 11/25/2023 9:00 A M Medical Record Number: [id number] Patient Account Number: [id number] Date of Birth/Sex: Treating RN: [birth year] (73 y.o. [name] Primary Care Galo Sayed: Richarda Blade Other Clinician: Referring Novak Stgermaine: Treating Krishiv Sandler/Extender: Jason Nest, [name] in Treatment: 0 Encounter Discharge Information Items Post Procedure Vitals Discharge Condition: Stable Temperature (F): 97.8 Ambulatory Status: Cane Pulse (bpm): 85 Discharge Destination: Home Respiratory Rate (breaths/min): 18 Transportation: Private Auto Blood Pressure (mmHg): 175/84 Accompanied By: daughter Schedule Follow-up Appointment: Yes Clinical Summary of Care: Patient Declined Electronic Signature(s) Signed: 11/25/2023 4:58:03 PM By: Zenaida Deed RN, BSN Entered By: Zenaida Deed on 11/25/2023 10:27:09 -------------------------------------------------------------------------------- Lower Extremity Assessment Details Patient Name: Date of Service: [name] [facility] A. 11/25/2023 9:00 A M Medical Record Number: [id number] Patient Account Number: [id number] Date of Birth/Sex: Treating RN: [birth year] (73 y.o. [name] Primary Care Maud Rubendall: Richarda Blade Other Clinician: Referring Suzannah Bettes: Treating Deziah Renwick/Extender: Duanne Guess Ngetich, [name] in Treatment: 0 Edema Assessment Assessed: [Left: No] [Right: No] Edema: [Left: Ye] [Right: s] Calf Left: Right: Point of Measurement: From Medial Instep 39 cm Ankle Left:  Right: Point of Measurement: From Medial Instep 24 cm Vascular Assessment Extremity colors, hair growth, and conditions: Extremity Color: [Right:Hyperpigmented] Hair Growth on Extremity: [Right:No] Temperature of Extremity: [Right:Warm] Capillary Refill: [Right:< 3 seconds] Dependent Rubor: [Right:No] Blanched when Elevated: [Right:No No] Toe Nail Assessment Left: Right: Thick: Yes Discolored: Yes Deformed: No Improper Length and Hygiene: Yes Electronic Signature(s) BRAWLEY, ODONALD A ([id number]) [id number] Page 3 of 7 Signed: 11/25/2023 4:58:03 PM By: Zenaida Deed RN, BSN Entered By: Zenaida Deed on 11/25/2023 09:54:07 -------------------------------------------------------------------------------- Multi Wound Chart Details Patient Name: Date of Service: Val Eagle  DRO [name] [name] 11/25/2023 9:00 A M Medical Record Number: [id number] Patient Account Number: [id number] Date of Birth/Sex: Treating RN: 1951/07/30 (73 y.o. M) Primary Care Emeline Simpson: Richarda Blade Other Clinician: Referring Milbert Bixler: Treating Boaz Berisha/Extender: Jason Nest, [name] in Treatment: 0 Vital Signs Height(in): 68 Pulse(bpm): 85 Weight(lbs): 205 Blood Pressure(mmHg): 175/84 Body Mass Index(BMI): 31.2 Temperature(F): 97.8 Respiratory Rate(breaths/min): 18 [5:Photos:] [N/A:N/A] Right, Posterior Lower Leg N/A N/A Wound Location: Not Known N/A N/A Wounding Event: T be determined o N/A N/A Primary Etiology: 09/27/2023 N/A N/A Date Acquired: 0 N/A N/A Weeks of Treatment: Open N/A N/A Wound Status: No N/A N/A Wound Recurrence: 3x0.9x0.1 N/A N/A Measurements L x W x D (cm) 2.121 N/A N/A A (cm) : rea 0.212 N/A N/A Volume (cm) : Unclassifiable N/A N/A Classification: Treatment Notes Electronic Signature(s) Signed: 11/25/2023 9:50:22 AM By: Duanne Guess MD FACS Entered By: Duanne Guess on 11/25/2023  09:50:22 -------------------------------------------------------------------------------- Multi-Disciplinary Care Plan Details Patient Name: Date of Service: [name] [name] 11/25/2023 9:00 A M Medical Record Number: [id number] Patient Account Number: [id number] Date of Birth/Sex: Treating RN: 10-10-1951 (73 y.o. [name] Primary Care Juniper Snyders: Richarda Blade Other Clinician: Referring Wren Gallaga: Treating Rockwell Zentz/Extender: Jason Nest, [name] in Treatment: 0 SAMBA, MADEIRA A ([id number]) [id number] Page 4 of 7 Multidisciplinary Care Plan reviewed with physician Active Inactive Venous Leg Ulcer Nursing Diagnoses: Knowledge deficit related to disease process and management Potential for venous Insuffiency (use before diagnosis confirmed) Goals: Patient will maintain optimal edema control Date Initiated: 11/25/2023 Target Resolution Date: 12/23/2023 Goal Status: Active Interventions: Assess peripheral edema status every visit. Compression as ordered Provide education on venous insufficiency Treatment Activities: Therapeutic compression applied : 11/25/2023 Notes: Wound/Skin Impairment Nursing Diagnoses: Impaired tissue integrity Knowledge deficit related to ulceration/compromised skin integrity Goals: Patient/caregiver will verbalize understanding of skin care regimen Date Initiated: 11/25/2023 Target Resolution Date: 12/23/2023 Goal Status: Active Ulcer/skin breakdown will have a volume reduction of 30% by week 4 Date Initiated: 11/25/2023 Target Resolution Date: 12/23/2023 Goal Status: Active Interventions: Assess patient/caregiver ability to obtain necessary supplies Assess patient/caregiver ability to perform ulcer/skin care regimen upon admission and as needed Assess ulceration(s) every visit Provide education on ulcer and skin care Treatment Activities: Skin care regimen initiated : 11/25/2023 Topical wound  management initiated : 11/25/2023 Notes: Electronic Signature(s) Signed: 11/25/2023 4:58:03 PM By: Zenaida Deed RN, BSN Entered By: Zenaida Deed on 11/25/2023 10:04:54 -------------------------------------------------------------------------------- Pain Assessment Details Patient Name: Date of Service: [name] [name] 11/25/2023 9:00 A M Medical Record Number: [id number] Patient Account Number: [id number] Date of Birth/Sex: Treating RN: 06-30-1951 (73 y.o. [name] Primary Care Billye Pickerel: Richarda Blade Other Clinician: Referring Imani Fiebelkorn: Treating Jarrick Fjeld/Extender: Jason Nest, [name] in Treatment: 0 Active Problems Location of Pain Severity and Description of Pain Patient Has Paino No WOODFIN, MAYABB A ([id number]) [id number] Page 5 of 7 Patient Has Paino No Site Locations Rate the pain. Current Pain Level: 0 Pain Management and Medication Current Pain Management: Electronic Signature(s) Signed: 11/25/2023 4:58:03 PM By: Zenaida Deed RN, BSN Entered By: Zenaida Deed on 11/25/2023 09:55:54 -------------------------------------------------------------------------------- Patient/Caregiver Education Details Patient Name: Date of Service: Criss Alvine A. 1/16/2025andnbsp9:00 A M Medical Record Number: [id number] Patient Account Number: [id number] Date of Birth/Gender: Treating RN: 1951-09-01 (73 y.o. [name] Primary Care Physician: Richarda Blade Other Clinician: Referring Physician: Treating Physician/Extender: Pleas Patricia in Treatment: 0 Education Assessment Education Provided To: Patient Education Topics Provided Elevated Blood  Sugar/ Impact on Healing: Methods: Explain/Verbal Responses: Reinforcements needed, State content correctly Venous: Methods: Explain/Verbal Responses: Reinforcements needed, State content correctly Wound/Skin Impairment: Methods:  Explain/Verbal Responses: Reinforcements needed, State content correctly Electronic Signature(s) Signed: 11/25/2023 4:58:03 PM By: Zenaida Deed RN, BSN Entered By: Zenaida Deed on 11/25/2023 11:49:04 [name] ([id number]) [id number] Page 6 of 7 -------------------------------------------------------------------------------- Wound Assessment Details Patient Name: Date of Service: [name] [facility] A. 11/25/2023 9:00 A M Medical Record Number: [id number] Patient Account Number: [id number] Date of Birth/Sex: Treating RN: 1951/03/10 (73 y.o. M) Primary Care Isobel Eisenhuth: Richarda Blade Other Clinician: Referring Matricia Begnaud: Treating Vi Biddinger/Extender: Jason Nest, [name] in Treatment: 0 Wound Status Wound Number: 5 Primary Diabetic Wound/Ulcer of the Lower Extremity Etiology: Wound Location: Right, Posterior Lower Leg Secondary Arterial Insufficiency Ulcer Wounding Event: Gradually Appeared Etiology: Date Acquired: 09/27/2023 Wound Open Weeks Of Treatment: 0 Status: Clustered Wound: No Comorbid Sleep Apnea, Arrhythmia, Congestive Heart Failure, Hypertension, History: Peripheral Arterial Disease, Type II Diabetes, Osteoarthritis, Osteomyelitis, Neuropathy Photos Wound Measurements Length: (cm) 3 Width: (cm) 0 Depth: (cm) 0 Area: (cm) Volume: (cm) % Reduction in Area: .9 % Reduction in Volume: .1 Epithelialization: Medium (34-66%) 2.121 Tunneling: No 0.212 Undermining: No Wound Description Classification: Grade 1 Wound Margin: Flat and Intact Exudate Amount: Medium Exudate Type: Serosanguineous Exudate Color: red, brown Foul Odor After Cleansing: No Slough/Fibrino Yes Wound Bed Granulation Amount: Large (67-100%) Exposed Structure Granulation Quality: Red Fascia Exposed: No Necrotic Amount: Small (1-33%) Fat Layer (Subcutaneous Tissue) Exposed: Yes Necrotic Quality: Adherent Slough Tendon Exposed: No Muscle  Exposed: No Joint Exposed: No Bone Exposed: No Periwound Skin Texture Texture Color No Abnormalities Noted: Yes No Abnormalities Noted: No Hemosiderin Staining: Yes Moisture No Abnormalities Noted: Yes Temperature / Pain Temperature: No Abnormality Treatment Notes Wound #5 (Lower Leg) Wound Laterality: Right, Posterior BRANSON, HANNULA A ([id number]) [id number] Page 7 of 7 Cleanser Peri-Wound Care Sween Lotion (Moisturizing lotion) Discharge Instruction: Apply moisturizing lotion as directed Topical Primary Dressing Maxorb Extra Ag+ Alginate Dressing, 2x2 (in/in) Discharge Instruction: Apply to wound bed as instructed Secondary Dressing Zetuvit Plus Silicone Border Dressing 4x4 (in/in) Discharge Instruction: Apply silicone border over primary dressing as directed. Secured With [employer], 4.5x3.1 (in/yd) Discharge Instruction: Secure with Kerlix as directed. Paper Tape, 2x10 (in/yd) Discharge Instruction: Secure dressing with tape as directed. Tubigrip Size D, 3x10 (in/yd) Compression Wrap Compression Stockings Add-Ons Electronic Signature(s) Signed: 11/25/2023 4:58:03 PM By: Zenaida Deed RN, BSN Entered By: Zenaida Deed on 11/25/2023 10:06:32 -------------------------------------------------------------------------------- Vitals Details Patient Name: Date of Service: [name] [name] 11/25/2023 9:00 A M Medical Record Number: [id number] Patient Account Number: [id number] Date of Birth/Sex: Treating RN: 1951-09-08 (73 y.o. M) Primary Care Anais Koenen: Richarda Blade Other Clinician: Referring Maite Burlison: Treating Beckett Maden/Extender: Jason Nest, [name] in Treatment: 0 Vital Signs Time Taken: 09:21 Temperature (F): 97.8 Height (in): 68 Pulse (bpm): 85 Weight (lbs): 205 Respiratory Rate (breaths/min): 18 Body Mass Index (BMI): 31.2 Blood Pressure (mmHg): 175/84 Reference Range: 80 - 120 mg / dl Electronic  Signature(s) Signed: 11/25/2023 4:58:03 PM By: Zenaida Deed RN, BSN Entered By: Zenaida Deed on 11/25/2023 09:38:13

## 2023-11-26 DIAGNOSIS — L97522 Non-pressure chronic ulcer of other part of left foot with fat layer exposed: Secondary | ICD-10-CM | POA: Diagnosis not present

## 2023-11-26 DIAGNOSIS — E11622 Type 2 diabetes mellitus with other skin ulcer: Secondary | ICD-10-CM | POA: Diagnosis not present

## 2023-11-26 DIAGNOSIS — S81801A Unspecified open wound, right lower leg, initial encounter: Secondary | ICD-10-CM | POA: Diagnosis not present

## 2023-11-26 DIAGNOSIS — L97312 Non-pressure chronic ulcer of right ankle with fat layer exposed: Secondary | ICD-10-CM | POA: Diagnosis not present

## 2023-11-26 DIAGNOSIS — L97813 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: Secondary | ICD-10-CM | POA: Diagnosis not present

## 2023-11-26 DIAGNOSIS — I739 Peripheral vascular disease, unspecified: Secondary | ICD-10-CM | POA: Diagnosis not present

## 2023-11-26 DIAGNOSIS — E11621 Type 2 diabetes mellitus with foot ulcer: Secondary | ICD-10-CM | POA: Diagnosis not present

## 2023-12-03 ENCOUNTER — Ambulatory Visit: Payer: Medicare HMO | Attending: Internal Medicine | Admitting: *Deleted

## 2023-12-03 DIAGNOSIS — I513 Intracardiac thrombosis, not elsewhere classified: Secondary | ICD-10-CM | POA: Diagnosis not present

## 2023-12-03 DIAGNOSIS — Z5181 Encounter for therapeutic drug level monitoring: Secondary | ICD-10-CM

## 2023-12-03 LAB — POCT INR: POC INR: 1.5

## 2023-12-03 NOTE — Patient Instructions (Signed)
Description   Take 1.5  tablets today  then continue taking warfarin 1/2 tablet daily except for 1 tablet on Wednesdays, and Fridays.  Recheck INR 1.5 weeks.  Coumadin Clinic [phone]

## 2023-12-04 ENCOUNTER — Other Ambulatory Visit (HOSPITAL_COMMUNITY): Payer: Self-pay | Admitting: Family Medicine

## 2023-12-04 ENCOUNTER — Other Ambulatory Visit: Payer: Self-pay | Admitting: Cardiology

## 2023-12-04 DIAGNOSIS — I4891 Unspecified atrial fibrillation: Secondary | ICD-10-CM

## 2023-12-04 DIAGNOSIS — I5022 Chronic systolic (congestive) heart failure: Secondary | ICD-10-CM

## 2023-12-04 MED ORDER — METOPROLOL SUCCINATE ER 50 MG PO TB24
50.0000 mg | ORAL_TABLET | Freq: Every day | ORAL | 0 refills | Status: DC
Start: 2023-12-04 — End: 2024-04-05

## 2023-12-04 MED ORDER — METOPROLOL TARTRATE 25 MG PO TABS: 25.0000 mg | ORAL_TABLET | Freq: Every evening | ORAL | 0 refills | Status: DC

## 2023-12-04 NOTE — Progress Notes (Signed)
Patient's daughter called in reporting he is out of his Toprol/metoprol. According to office notes, he takes Toprol 50mg  qam/metoprolol 25mg  qpm. Will send refill for 90 tabs no refills. Also reporting his legs are more swollen and asking to increase lasix 20mg  daily to 20mg  BID. Advised that was ok but will need repeat labs next week. Will send message to the office to arrange.

## 2023-12-09 ENCOUNTER — Encounter (HOSPITAL_BASED_OUTPATIENT_CLINIC_OR_DEPARTMENT_OTHER): Payer: Medicare HMO | Admitting: General Surgery

## 2023-12-09 DIAGNOSIS — L97312 Non-pressure chronic ulcer of right ankle with fat layer exposed: Secondary | ICD-10-CM | POA: Diagnosis not present

## 2023-12-09 DIAGNOSIS — M86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, left ankle and foot: Secondary | ICD-10-CM | POA: Diagnosis not present

## 2023-12-09 DIAGNOSIS — M1990 Unspecified osteoarthritis, unspecified site: Secondary | ICD-10-CM | POA: Diagnosis not present

## 2023-12-09 DIAGNOSIS — E11622 Type 2 diabetes mellitus with other skin ulcer: Secondary | ICD-10-CM | POA: Diagnosis not present

## 2023-12-09 DIAGNOSIS — I4891 Unspecified atrial fibrillation: Secondary | ICD-10-CM | POA: Diagnosis not present

## 2023-12-09 DIAGNOSIS — L97212 Non-pressure chronic ulcer of right calf with fat layer exposed: Secondary | ICD-10-CM | POA: Diagnosis not present

## 2023-12-09 DIAGNOSIS — I5022 Chronic systolic (congestive) heart failure: Secondary | ICD-10-CM | POA: Diagnosis not present

## 2023-12-09 DIAGNOSIS — G4733 Obstructive sleep apnea (adult) (pediatric): Secondary | ICD-10-CM | POA: Diagnosis not present

## 2023-12-09 DIAGNOSIS — I110 Hypertensive heart disease with heart failure: Secondary | ICD-10-CM | POA: Diagnosis not present

## 2023-12-09 DIAGNOSIS — I872 Venous insufficiency (chronic) (peripheral): Secondary | ICD-10-CM | POA: Diagnosis not present

## 2023-12-13 ENCOUNTER — Ambulatory Visit: Payer: Medicare HMO

## 2023-12-14 ENCOUNTER — Other Ambulatory Visit (HOSPITAL_COMMUNITY): Payer: Self-pay | Admitting: Cardiology

## 2023-12-14 NOTE — Telephone Encounter (Signed)
 This is a CHF pt

## 2023-12-16 ENCOUNTER — Encounter (HOSPITAL_BASED_OUTPATIENT_CLINIC_OR_DEPARTMENT_OTHER): Payer: Medicare HMO | Attending: General Surgery | Admitting: General Surgery

## 2023-12-16 DIAGNOSIS — E11622 Type 2 diabetes mellitus with other skin ulcer: Secondary | ICD-10-CM | POA: Diagnosis not present

## 2023-12-16 DIAGNOSIS — E1151 Type 2 diabetes mellitus with diabetic peripheral angiopathy without gangrene: Secondary | ICD-10-CM | POA: Insufficient documentation

## 2023-12-16 DIAGNOSIS — L97312 Non-pressure chronic ulcer of right ankle with fat layer exposed: Secondary | ICD-10-CM | POA: Insufficient documentation

## 2023-12-16 DIAGNOSIS — L97212 Non-pressure chronic ulcer of right calf with fat layer exposed: Secondary | ICD-10-CM | POA: Diagnosis not present

## 2023-12-17 ENCOUNTER — Ambulatory Visit: Payer: Medicare HMO | Attending: Cardiology

## 2023-12-17 DIAGNOSIS — Z5181 Encounter for therapeutic drug level monitoring: Secondary | ICD-10-CM | POA: Diagnosis not present

## 2023-12-17 DIAGNOSIS — I513 Intracardiac thrombosis, not elsewhere classified: Secondary | ICD-10-CM | POA: Diagnosis not present

## 2023-12-17 LAB — POCT INR: INR: 2.8 (ref 2.0–3.0)

## 2023-12-17 NOTE — Patient Instructions (Signed)
 continue taking warfarin 1/2 tablet daily except for 1 tablet on Wednesdays, and Fridays.  Recheck INR in 6 weeks.  Coumadin  Clinic [phone]

## 2023-12-20 ENCOUNTER — Ambulatory Visit: Payer: Medicare HMO

## 2023-12-21 ENCOUNTER — Ambulatory Visit (HOSPITAL_BASED_OUTPATIENT_CLINIC_OR_DEPARTMENT_OTHER): Payer: Medicare HMO | Admitting: General Surgery

## 2023-12-23 DIAGNOSIS — I739 Peripheral vascular disease, unspecified: Secondary | ICD-10-CM | POA: Diagnosis not present

## 2023-12-23 DIAGNOSIS — L97813 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: Secondary | ICD-10-CM | POA: Diagnosis not present

## 2023-12-23 DIAGNOSIS — E11621 Type 2 diabetes mellitus with foot ulcer: Secondary | ICD-10-CM | POA: Diagnosis not present

## 2023-12-23 DIAGNOSIS — E11622 Type 2 diabetes mellitus with other skin ulcer: Secondary | ICD-10-CM | POA: Diagnosis not present

## 2023-12-23 DIAGNOSIS — L97312 Non-pressure chronic ulcer of right ankle with fat layer exposed: Secondary | ICD-10-CM | POA: Diagnosis not present

## 2023-12-23 DIAGNOSIS — L97522 Non-pressure chronic ulcer of other part of left foot with fat layer exposed: Secondary | ICD-10-CM | POA: Diagnosis not present

## 2023-12-23 DIAGNOSIS — S81801A Unspecified open wound, right lower leg, initial encounter: Secondary | ICD-10-CM | POA: Diagnosis not present

## 2023-12-27 NOTE — Progress Notes (Unsigned)
Patient name: [name] MRN: [id number] DOB: Sep 28, 1951 Sex: male  REASON FOR CONSULT: 6 month follow-up for PAD/CLI  HPI: [name] is a 73 y.o. male, with history of atrial fibrillation, congestive heart failure, CKD, diabetes, hypertension, PVD that presents for 6 month follow-up for surveillance PAD.    Patient is well-known to our practice.  He had previous left peroneal angioplasty on 05/19/2021 followed by left fifth toe amputation on 05/22/2021 that healed.  He then had a right PT angioplasty with retrograde pedal access on 06/11/2022 also for tissue loss.  Again on 12/10/2022 he had another left peroneal angioplasty.    Past Medical History:  Diagnosis Date   A-fib [facility])    a. (06/04/14) TEE-DC-CV; succesful; large LA 6.2 cm   ADHD    Ascites    Athlete's foot    Chronic systolic heart failure (HCC)    a. 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, unspecified CKD stage, unspecified whether long term insulin use (HCC)    Heart murmur    HTN (hypertension)    OSA (obstructive sleep apnea)    PVD (peripheral vascular disease) (HCC)    s/p great toe amputation    Past Surgical History:  Procedure Laterality Date   ABDOMINAL AORTOGRAM W/LOWER EXTREMITY N/A 05/19/2021   Procedure: ABDOMINAL AORTOGRAM W/LOWER EXTREMITY;  Surgeon: Cephus Shelling, MD;  Location: [facility];  Service: Cardiovascular;  Laterality: N/A;   ABDOMINAL AORTOGRAM W/LOWER EXTREMITY Right 06/11/2022   Procedure: ABDOMINAL AORTOGRAM W/LOWER EXTREMITY;  Surgeon: Cephus Shelling, MD;  Location: [facility];  Service: Cardiovascular;  Laterality: Right;   ABDOMINAL AORTOGRAM W/LOWER EXTREMITY Left 12/10/2022   Procedure: ABDOMINAL AORTOGRAM W/LOWER EXTREMITY;  Surgeon: Cephus Shelling, MD;  Location: [facility];  Service: Cardiovascular;  Laterality: Left;   AMPUTATION Left 05/22/2021   Procedure: LEFT FIFTH TOE AMPUTATION;  Surgeon: Cephus Shelling, MD;  Location: [facility] OR;  Service: Vascular;  Laterality: Left;   CARDIOVERSION N/A 06/04/2014   Procedure: CARDIOVERSION;  Surgeon: Laurey Morale, MD;  Location: [facility] ENDOSCOPY;  Service: Cardiovascular;  Laterality: N/A;   CARDIOVERSION N/A 11/06/2021   Procedure: CARDIOVERSION;  Surgeon: Laurey Morale, MD;  Location: [facility] ENDOSCOPY;  Service: Cardiovascular;  Laterality: N/A;   NOSE SURGERY     Nasal septum surgery   PERIPHERAL VASCULAR BALLOON ANGIOPLASTY  06/11/2022   Procedure: PERIPHERAL VASCULAR BALLOON ANGIOPLASTY;  Surgeon: Cephus Shelling, MD;  Location: [facility];  Service: Cardiovascular;;   PERIPHERAL VASCULAR BALLOON ANGIOPLASTY Left 12/10/2022   Procedure: PERIPHERAL VASCULAR BALLOON ANGIOPLASTY;  Surgeon: Cephus Shelling, MD;  Location: [facility];  Service: Cardiovascular;  Laterality: Left;  Peroneal   PERIPHERAL VASCULAR INTERVENTION Left 05/19/2021   Procedure: PERIPHERAL VASCULAR INTERVENTION;  Surgeon: Cephus Shelling, MD;  Location: [facility];  Service: Cardiovascular;  Laterality: Left;   TEE WITHOUT CARDIOVERSION N/A 06/04/2014   Procedure: TRANSESOPHAGEAL ECHOCARDIOGRAM (TEE);  Surgeon: Laurey Morale, MD;  Location: [facility] ENDOSCOPY;  Service: Cardiovascular;  Laterality: N/A;   TEE WITHOUT CARDIOVERSION N/A 01/06/2016   Procedure: TRANSESOPHAGEAL ECHOCARDIOGRAM (TEE);  Surgeon: Pricilla Riffle, MD;  Location: [facility] ENDOSCOPY;  Service: Cardiovascular;  Laterality: N/A;   TEE WITHOUT  CARDIOVERSION N/A 11/06/2021   Procedure: TRANSESOPHAGEAL ECHOCARDIOGRAM (TEE);  Surgeon: Laurey Morale, MD;  Location: [facility] ENDOSCOPY;  Service: Cardiovascular;  Laterality: N/A;   VASECTOMY      Family History  Problem Relation Age of Onset   Heart attack Mother         deceased   Diabetes Mother    Heart attack Sister     SOCIAL HISTORY: Social History   Socioeconomic History   Marital status: Single    Spouse name: Not on file   Number of children: Not on file   Years of education: Not on file   Highest education level: Not on file  Occupational History   Occupation: retired  Tobacco Use   Smoking status: Some Days    Current packs/day: 0.00    Types: Cigarettes, Cigars    Start date: 05/1969    Last attempt to quit: 05/2021    Years since quitting: 2.6    Passive exposure: Never   Smokeless tobacco: Never  Vaping Use   Vaping status: Never Used  Substance and Sexual Activity   Alcohol use: Not Currently   Drug use: Never   Sexual activity: Not on file  Other Topics Concern   Not on file  Social History Narrative   ** Merged History Encounter ** Lives in [place] by himself. Retired from [employer] for [employer].       Tobacco use, amount per day now:   Past tobacco use, amount per day:   How many years did you use tobacco:   Alcohol use (drinks per week): N/A   Diet:   Do you drink/eat things with caffeine:   Marital status:  Divorced                                What year were you married? 2003   Do you live in a house, apartment, assisted living, condo, trailer, etc.? House   Is it one or more stories? One   How many persons live in your home?   Do you have pets in your home?( please list) N/A   Highest Level of education completed? Bachelors Degree   Current or past profession: Teacher-Special Ed   Do you exercise?  Yes                                Type and how often? Daily squats, and push ups.   Do you have a living will? No   Do you have a DNR form?  No                                 If not, do you want to discuss one?   Do you have signed POA/HPOA forms?  No                      If so, please bring to you appointment      Do you have any difficulty bathing or dressing yourself? Bathing only  if pants not shirts/not shorts.   Do you have any difficulty preparing food or eating? No   Do you have any difficulty managing your medications? No   Do you have any difficulty managing your finances? No   Do you have any difficulty  affording your medications? No   Social Drivers of Corporate investment banker Strain: Not on file  Food Insecurity: No Food Insecurity (11/10/2021)   Hunger Vital Sign    Worried About Running Out of Food in the Last Year: Never true    Ran Out of Food in the Last Year: Never true  Transportation Needs: No Transportation Needs (11/10/2021)   PRAPARE - Administrator, Civil Service (Medical): No    Lack of Transportation (Non-Medical): No  Physical Activity: Not on file  Stress: Not on file  Social Connections: Not on file  Intimate Partner Violence: Not on file    No Known Allergies  Current Outpatient Medications  Medication Sig Dispense Refill   Accu-Chek Softclix Lancets lancets 1 each by Other route daily. As instructed E11.22 100 each 11   Blood Glucose Monitoring Suppl (ACCU-CHEK AVIVA PLUS) w/Device KIT 1 Device by Does not apply route daily. E11.22 1 kit 11   clopidogrel (PLAVIX) 75 MG tablet Take 1 tablet (75 mg total) by mouth daily. 30 tablet 11   diclofenac Sodium (VOLTAREN) 1 % GEL Apply 1 Application topically 4 (four) times daily as needed (pain).     furosemide (LASIX) 20 MG tablet Take 1 tablet (20 mg total) by mouth daily. One Tablet by mouth in the morning. 30 tablet 4   glucose blood (ACCU-CHEK AVIVA PLUS) test strip 1 each by Other route daily. As instructed E11.22 100 each 11   Insulin Syringe-Needle U-100 (TRUEPLUS INSULIN SYRINGE) 30G X 5/16" 1 ML MISC USE ONCE DAILY FOR INSULIN INJECTIONS. 100 each 2   JARDIANCE 10 MG TABS tablet TAKE 1 TABLET(10 MG) BY MOUTH DAILY 30 tablet 2   LANTUS 100 UNIT/ML injection INJECT 8 UNITS UNDER THE SKIN ONCE DAILY AS DIRECTED (Patient taking differently: Inject 8 Units into the skin  daily.) 10 mL 11   losartan (COZAAR) 25 MG tablet Take 1 tablet (25 mg total) by mouth daily. 90 tablet 3   meclizine (ANTIVERT) 25 MG tablet Take 1 tablet (25 mg total) by mouth 3 (three) times daily as needed for dizziness. 30 tablet 0   metoprolol succinate (TOPROL XL) 50 MG 24 hr tablet Take 1 tablet (50 mg total) by mouth daily. 90 tablet 0   metoprolol tartrate (LOPRESSOR) 25 MG tablet Take 1 tablet (25 mg total) by mouth every evening. 90 tablet 0   Misc Natural Products (JOINT SUPPORT PO) Take 1 Dose by mouth 3 (three) times a week.     spironolactone (ALDACTONE) 25 MG tablet TAKE 1 TABLET(25 MG) BY MOUTH DAILY 90 tablet 2   warfarin (COUMADIN) 6 MG tablet Take 1-2 tablets daily or as prescribed by [facility] 30 tablet 2   No current facility-administered medications for this visit.    REVIEW OF SYSTEMS:  [X]  denotes positive finding, [ ]  denotes negative finding Cardiac  Comments:  Chest pain or chest pressure:    Shortness of breath upon exertion:    Short of breath when lying flat:    Irregular heart rhythm:        Vascular    Pain in calf, thigh, or hip brought on by ambulation:    Pain in feet at night that wakes you up from your sleep:     Blood clot in your veins:    Leg swelling:         Pulmonary    Oxygen at home:    Productive cough:  Wheezing:         Neurologic    Sudden weakness in arms or legs:     Sudden numbness in arms or legs:     Sudden onset of difficulty speaking or slurred speech:    Temporary loss of vision in one eye:     Problems with dizziness:         Gastrointestinal    Blood in stool:     Vomited blood:         Genitourinary    Burning when urinating:     Blood in urine:        Psychiatric    Major depression:         Hematologic    Bleeding problems:    Problems with blood clotting too easily:        Skin    Rashes or ulcers:        Constitutional    Fever or chills:      PHYSICAL EXAM: There were no vitals  filed for this visit.   GENERAL: The patient is a well-nourished male, in no acute distress. The vital signs are documented above. CARDIAC: There is a regular rate and rhythm.  VASCULAR:  Brisk PT signals bilateral Right second toe scab as pictured Left 5th toe amp previously well healed PULMONARY: No respiratory distress.    DATA:   ABIs   Bilateral lower extremity arterial duplex with no obvious stenosis  Assessment/Plan:  73 y.o. male, with history of atrial fibrillation, congestive heart failure, CKD, diabetes, hypertension, PVD that presents for 6 month follow-up for surveillance PAD.    Patient is well-known to our practice.  He had previous left peroneal angioplasty on 05/19/2021 followed by left fifth toe amputation on 05/22/2021 that healed.  He then had a right PT angioplasty with retrograde pedal access on 06/11/2022 also for tissue loss.  Again on 12/10/2022 he had another left peroneal angioplasty.  Cephus Shelling, MD [facility] Office: [phone]

## 2023-12-28 ENCOUNTER — Ambulatory Visit (INDEPENDENT_AMBULATORY_CARE_PROVIDER_SITE_OTHER)
Admission: RE | Admit: 2023-12-28 | Discharge: 2023-12-28 | Disposition: A | Discharge status: Home / Self Care | Payer: Medicare HMO | Source: Ambulatory Visit | Attending: Vascular Surgery | Admitting: Vascular Surgery

## 2023-12-28 ENCOUNTER — Ambulatory Visit (HOSPITAL_COMMUNITY)
Admission: RE | Admit: 2023-12-28 | Discharge: 2023-12-28 | Disposition: A | Discharge status: Home / Self Care | Payer: Medicare HMO | Source: Ambulatory Visit | Attending: Vascular Surgery | Admitting: Vascular Surgery

## 2023-12-28 ENCOUNTER — Ambulatory Visit (INDEPENDENT_AMBULATORY_CARE_PROVIDER_SITE_OTHER): Payer: Medicare HMO | Admitting: Vascular Surgery

## 2023-12-28 ENCOUNTER — Encounter: Payer: Self-pay | Admitting: Vascular Surgery

## 2023-12-28 VITALS — BP 163/91 | HR 86 | Temp 98.5°F | Resp 20 | Ht 68.0 in | Wt 232.4 lb

## 2023-12-28 DIAGNOSIS — I739 Peripheral vascular disease, unspecified: Secondary | ICD-10-CM

## 2023-12-28 DIAGNOSIS — I70223 Atherosclerosis of native arteries of extremities with rest pain, bilateral legs: Secondary | ICD-10-CM

## 2023-12-28 LAB — VAS US ABI WITH/WO TBI
Left ABI: 0.93
Right ABI: 0.81

## 2023-12-29 ENCOUNTER — Other Ambulatory Visit: Payer: Self-pay

## 2023-12-29 DIAGNOSIS — I70223 Atherosclerosis of native arteries of extremities with rest pain, bilateral legs: Secondary | ICD-10-CM

## 2023-12-31 ENCOUNTER — Encounter: Payer: Self-pay | Admitting: Vascular Surgery

## 2024-01-03 ENCOUNTER — Telehealth: Payer: Self-pay

## 2024-01-03 NOTE — Telephone Encounter (Signed)
 Attempted to call to inform change in Dr. Ophelia Charter schedule for 2/27.  Patient needs to report to [facility] OR at 5:30 a.

## 2024-01-05 ENCOUNTER — Encounter (HOSPITAL_BASED_OUTPATIENT_CLINIC_OR_DEPARTMENT_OTHER): Payer: Medicare HMO | Admitting: General Surgery

## 2024-01-05 DIAGNOSIS — L97312 Non-pressure chronic ulcer of right ankle with fat layer exposed: Secondary | ICD-10-CM | POA: Diagnosis not present

## 2024-01-05 DIAGNOSIS — E1151 Type 2 diabetes mellitus with diabetic peripheral angiopathy without gangrene: Secondary | ICD-10-CM | POA: Diagnosis not present

## 2024-01-05 DIAGNOSIS — E11622 Type 2 diabetes mellitus with other skin ulcer: Secondary | ICD-10-CM | POA: Diagnosis not present

## 2024-01-06 ENCOUNTER — Other Ambulatory Visit: Payer: Self-pay

## 2024-01-06 ENCOUNTER — Encounter (HOSPITAL_COMMUNITY): Payer: Self-pay | Admitting: Vascular Surgery

## 2024-01-06 ENCOUNTER — Encounter (HOSPITAL_COMMUNITY)
Admission: RE | Disposition: A | Discharge status: Home / Self Care | Payer: Self-pay | Source: Home / Self Care | Attending: Vascular Surgery

## 2024-01-06 ENCOUNTER — Ambulatory Visit (HOSPITAL_COMMUNITY)
Admission: RE | Admit: 2024-01-06 | Discharge: 2024-01-06 | Disposition: A | Discharge status: Home / Self Care | Payer: Medicare HMO | Attending: Vascular Surgery | Admitting: Vascular Surgery

## 2024-01-06 DIAGNOSIS — S81801A Unspecified open wound, right lower leg, initial encounter: Secondary | ICD-10-CM | POA: Diagnosis not present

## 2024-01-06 DIAGNOSIS — I739 Peripheral vascular disease, unspecified: Secondary | ICD-10-CM | POA: Diagnosis not present

## 2024-01-06 DIAGNOSIS — Z7984 Long term (current) use of oral hypoglycemic drugs: Secondary | ICD-10-CM | POA: Diagnosis not present

## 2024-01-06 DIAGNOSIS — E11622 Type 2 diabetes mellitus with other skin ulcer: Secondary | ICD-10-CM | POA: Diagnosis not present

## 2024-01-06 DIAGNOSIS — L97819 Non-pressure chronic ulcer of other part of right lower leg with unspecified severity: Secondary | ICD-10-CM | POA: Diagnosis not present

## 2024-01-06 DIAGNOSIS — E1151 Type 2 diabetes mellitus with diabetic peripheral angiopathy without gangrene: Secondary | ICD-10-CM | POA: Insufficient documentation

## 2024-01-06 DIAGNOSIS — I5022 Chronic systolic (congestive) heart failure: Secondary | ICD-10-CM | POA: Diagnosis not present

## 2024-01-06 DIAGNOSIS — I130 Hypertensive heart and chronic kidney disease with heart failure and stage 1 through stage 4 chronic kidney disease, or unspecified chronic kidney disease: Secondary | ICD-10-CM | POA: Insufficient documentation

## 2024-01-06 DIAGNOSIS — I70238 Atherosclerosis of native arteries of right leg with ulceration of other part of lower right leg: Secondary | ICD-10-CM | POA: Diagnosis not present

## 2024-01-06 DIAGNOSIS — I70221 Atherosclerosis of native arteries of extremities with rest pain, right leg: Secondary | ICD-10-CM

## 2024-01-06 DIAGNOSIS — Z87891 Personal history of nicotine dependence: Secondary | ICD-10-CM | POA: Insufficient documentation

## 2024-01-06 DIAGNOSIS — E11621 Type 2 diabetes mellitus with foot ulcer: Secondary | ICD-10-CM | POA: Diagnosis not present

## 2024-01-06 DIAGNOSIS — Z7901 Long term (current) use of anticoagulants: Secondary | ICD-10-CM | POA: Insufficient documentation

## 2024-01-06 DIAGNOSIS — L97813 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: Secondary | ICD-10-CM | POA: Diagnosis not present

## 2024-01-06 DIAGNOSIS — L97522 Non-pressure chronic ulcer of other part of left foot with fat layer exposed: Secondary | ICD-10-CM | POA: Diagnosis not present

## 2024-01-06 DIAGNOSIS — L97312 Non-pressure chronic ulcer of right ankle with fat layer exposed: Secondary | ICD-10-CM | POA: Diagnosis not present

## 2024-01-06 DIAGNOSIS — Z794 Long term (current) use of insulin: Secondary | ICD-10-CM | POA: Insufficient documentation

## 2024-01-06 DIAGNOSIS — E1122 Type 2 diabetes mellitus with diabetic chronic kidney disease: Secondary | ICD-10-CM | POA: Diagnosis not present

## 2024-01-06 DIAGNOSIS — N183 Chronic kidney disease, stage 3 unspecified: Secondary | ICD-10-CM | POA: Diagnosis not present

## 2024-01-06 DIAGNOSIS — I4891 Unspecified atrial fibrillation: Secondary | ICD-10-CM | POA: Insufficient documentation

## 2024-01-06 DIAGNOSIS — I70223 Atherosclerosis of native arteries of extremities with rest pain, bilateral legs: Secondary | ICD-10-CM

## 2024-01-06 HISTORY — PX: ABDOMINAL AORTOGRAM W/LOWER EXTREMITY: CATH118223

## 2024-01-06 HISTORY — PX: PERIPHERAL VASCULAR BALLOON ANGIOPLASTY: CATH118281

## 2024-01-06 LAB — PROTIME-INR
INR: 1.3 — ABNORMAL HIGH (ref 0.8–1.2)
Prothrombin Time: 16.6 s — ABNORMAL HIGH (ref 11.4–15.2)

## 2024-01-06 LAB — GLUCOSE, CAPILLARY: Glucose-Capillary: 107 mg/dL — ABNORMAL HIGH (ref 70–99)

## 2024-01-06 LAB — POCT I-STAT, CHEM 8
BUN: 35 mg/dL — ABNORMAL HIGH (ref 8–23)
Calcium, Ion: 1.25 mmol/L (ref 1.15–1.40)
Chloride: 105 mmol/L (ref 98–111)
Creatinine, Ser: 2.1 mg/dL — ABNORMAL HIGH (ref 0.61–1.24)
Glucose, Bld: 117 mg/dL — ABNORMAL HIGH (ref 70–99)
HCT: 34 % — ABNORMAL LOW (ref 39.0–52.0)
Hemoglobin: 11.6 g/dL — ABNORMAL LOW (ref 13.0–17.0)
Potassium: 4.8 mmol/L (ref 3.5–5.1)
Sodium: 141 mmol/L (ref 135–145)
TCO2: 26 mmol/L (ref 22–32)

## 2024-01-06 SURGERY — ABDOMINAL AORTOGRAM W/LOWER EXTREMITY
Anesthesia: LOCAL | Laterality: Right

## 2024-01-06 MED ORDER — HYDRALAZINE HCL 20 MG/ML IJ SOLN: 5.0000 mg | INTRAMUSCULAR | Status: DC | PRN

## 2024-01-06 MED ORDER — FENTANYL CITRATE (PF) 100 MCG/2ML IJ SOLN
INTRAMUSCULAR | Status: DC | PRN
  Administered 2024-01-06 (×2): 25 ug via INTRAVENOUS

## 2024-01-06 MED ORDER — FENTANYL CITRATE (PF) 100 MCG/2ML IJ SOLN
INTRAMUSCULAR | Status: AC
  Filled 2024-01-06: qty 2

## 2024-01-06 MED ORDER — HEPARIN (PORCINE) IN NACL 1000-0.9 UT/500ML-% IV SOLN
INTRAVENOUS | Status: DC | PRN
  Administered 2024-01-06 (×2): 500 mL

## 2024-01-06 MED ORDER — SODIUM CHLORIDE 0.9% FLUSH: 3.0000 mL | Freq: Two times a day (BID) | INTRAVENOUS | Status: DC

## 2024-01-06 MED ORDER — LIDOCAINE HCL (PF) 1 % IJ SOLN
INTRAMUSCULAR | Status: AC
  Filled 2024-01-06: qty 30

## 2024-01-06 MED ORDER — IODIXANOL 320 MG/ML IV SOLN
INTRAVENOUS | Status: DC | PRN
  Administered 2024-01-06: 40 mL

## 2024-01-06 MED ORDER — SODIUM CHLORIDE 0.9 % IV SOLN: INTRAVENOUS | Status: DC

## 2024-01-06 MED ORDER — CLOPIDOGREL BISULFATE 75 MG PO TABS
ORAL_TABLET | ORAL | Status: AC
  Filled 2024-01-06: qty 1

## 2024-01-06 MED ORDER — MIDAZOLAM HCL 2 MG/2ML IJ SOLN
INTRAMUSCULAR | Status: DC | PRN
  Administered 2024-01-06 (×2): 1 mg via INTRAVENOUS

## 2024-01-06 MED ORDER — ACETAMINOPHEN 325 MG PO TABS: 650.0000 mg | ORAL_TABLET | ORAL | Status: DC | PRN

## 2024-01-06 MED ORDER — SODIUM CHLORIDE 0.9% FLUSH: 3.0000 mL | INTRAVENOUS | Status: DC | PRN

## 2024-01-06 MED ORDER — LABETALOL HCL 5 MG/ML IV SOLN: 10.0000 mg | INTRAVENOUS | Status: DC | PRN

## 2024-01-06 MED ORDER — CLOPIDOGREL BISULFATE 75 MG PO TABS: 75.0000 mg | ORAL_TABLET | Freq: Every day | ORAL | Status: DC

## 2024-01-06 MED ORDER — LIDOCAINE HCL (PF) 1 % IJ SOLN
INTRAMUSCULAR | Status: DC | PRN
  Administered 2024-01-06: 18 mL

## 2024-01-06 MED ORDER — HEPARIN SODIUM (PORCINE) 1000 UNIT/ML IJ SOLN
INTRAMUSCULAR | Status: DC | PRN
  Administered 2024-01-06: 10000 [IU] via INTRAVENOUS

## 2024-01-06 MED ORDER — SODIUM CHLORIDE 0.9 % IV SOLN: 250.0000 mL | INTRAVENOUS | Status: DC | PRN

## 2024-01-06 MED ORDER — MIDAZOLAM HCL 2 MG/2ML IJ SOLN
INTRAMUSCULAR | Status: AC
  Filled 2024-01-06: qty 2

## 2024-01-06 MED ORDER — ONDANSETRON HCL 4 MG/2ML IJ SOLN: 4.0000 mg | Freq: Four times a day (QID) | INTRAMUSCULAR | Status: DC | PRN

## 2024-01-06 MED ORDER — HEPARIN SODIUM (PORCINE) 1000 UNIT/ML IJ SOLN
INTRAMUSCULAR | Status: AC
  Filled 2024-01-06: qty 10

## 2024-01-06 MED ORDER — CLOPIDOGREL BISULFATE 300 MG PO TABS
ORAL_TABLET | ORAL | Status: DC | PRN
  Administered 2024-01-06: 75 mg via ORAL

## 2024-01-06 SURGICAL SUPPLY — 23 items
BALLN STERLING OTW 3X150X150 (BALLOONS) ×2
BALLOON STERLING OTW 3X150X150 (BALLOONS) IMPLANT
CATH OMNI FLUSH 5F 65CM (CATHETERS) IMPLANT
CATH QUICKCROSS .018X135CM (MICROCATHETER) IMPLANT
CATH SHOCKWAVE M5 4.5X60 (CATHETERS) IMPLANT
CATH TEMPO AQUA 5F 100CM (CATHETERS) IMPLANT
COVER DOME SNAP 22 D (MISCELLANEOUS) IMPLANT
DEVICE CLOSURE MYNXGRIP 5F (Vascular Products) IMPLANT
GUIDEWIRE ANGLED .035X150CM (WIRE) IMPLANT
KIT ANGIASSIST CO2 SYSTEM (KITS) IMPLANT
KIT ENCORE 26 ADVANTAGE (KITS) IMPLANT
KIT MICROPUNCTURE NIT STIFF (SHEATH) IMPLANT
KIT SINGLE USE MANIFOLD (KITS) IMPLANT
KIT SYRINGE INJ CVI SPIKEX1 (MISCELLANEOUS) IMPLANT
SET ATX-X65L (MISCELLANEOUS) IMPLANT
SHEATH CATAPULT 5F 45 MP (SHEATH) IMPLANT
SHEATH PINNACLE 5F 10CM (SHEATH) IMPLANT
SHEATH PROBE COVER 6X72 (BAG) IMPLANT
TRAY PV CATH (CUSTOM PROCEDURE TRAY) ×2 IMPLANT
WIRE BENTSON .035X145CM (WIRE) IMPLANT
WIRE G V18X300CM (WIRE) IMPLANT
WIRE ROSEN-J .035X260CM (WIRE) IMPLANT
WIRE SPARTACORE .014X300CM (WIRE) IMPLANT

## 2024-01-06 NOTE — Discharge Instructions (Signed)
 Continue Plavix.  Can resume Coumadin tonight.  Will arrange follow-up in 1 month.  Excellent results after right leg intervention.

## 2024-01-06 NOTE — Op Note (Signed)
 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  Ultrasound-guided access left common femoral artery 2.  CO2 aortogram with catheter selection of aorta 3.  Right lower extremity arteriogram with catheter selection of the SFA and popliteal artery 4.  Right posterior tibial artery angioplasty (3 mm x 150 mm Sterling) 5.  Right below-knee popliteal shockwave lithotripsy balloon (4.5 mm x 60 mm shockwave lithotripsy balloon x 210 pulses) 6.  65 minutes of monitored moderate conscious sedation time 7.  Mynx closure of the left common femoral artery  Indications: Patient is a 73 year old male with multiple risk factors that presents with ongoing chronic right lower extremity tissue loss.  He has had prior right lower extremity tibial interventions.  He presents for right lower extremity angiogram with possible intervention after risks benefits discussed.  We tried to use CO2 to limit contrast due to his CKD.  Findings:   Ultrasound-guided access left common femoral artery.  CO2 aortogram showed no flow-limiting stenosis with a patent infrarenal aorta.  The renal arteries were poorly visualized.  Both iliacs were patent without flow-limiting stenosis.  On the right the common femoral, profunda, SFA were widely patent.  He had a high-grade 90% below-knee popliteal focal stenosis.  Dominant runoff in the right lower extremity is through the posterior tibial and proximally there was a high-grade 99% stenosis at the ostium with a diffusely diseased vessel proximally.  The peroneal is patent but then occludes in the distal calf.  The anterior tibial is occluded.  Ultimately the posterior tibial stenosis including ostium was treated with a 3 mm Sterling with widely  patent vessel at completion.  The below-knee popliteal artery was then treated with shockwave lithotripsy.  Widely patent vessel at completion.  Preserved runoff into the foot.   Procedure:  The patient was identified in the holding area and taken to room 8.  The patient was then placed supine on the table and prepped and draped in the usual sterile fashion.  A time out was called.  The patient received Versed and fentanyl for conscious moderate sedation.  Vital signs were monitored including heart rate, respiratory rate, oxygenation and blood pressure.  I was present for all moderate sedation.  Ultrasound was used to evaluate the left common femoral artery.  It was patent .  A digital ultrasound image was acquired.  A micropuncture needle was used to access the left common femoral artery under ultrasound guidance.  An 018 wire was advanced without resistance and a micropuncture sheath was placed.  The 018 wire was removed and a benson wire was placed.  The micropuncture sheath was exchanged for a 5 french sheath.  An omniflush catheter was advanced over the wire to the level of L-1.  An abdominal angiogram was obtained.  Next, using the omniflush catheter and a benson wire, the aortic bifurcation was crossed and the catheter was placed into theright external iliac artery and right runoff was obtained.  With CO2 we could not really get good images below the knee so I used a long straight catheter and got limited contrast hand injections with a catheter in the right SFA and popliteal artery.  Ultimately elected to intervene.  I upsized to a long 5 French sheath in the left groin over the aortic bifurcation.  Patient was given  100 units/kg IV heparin.  I then got a V18 wire with an 018 quick cross and I was able to get down the posterior tibial antegrade from contralateral groin access.  I got my wire all the way into the foot.  The posterior tibial was then treated with a 3 mm x 150 mm Sterling for 2 minutes in the  proximal vessel including at the ostium.  I then exchanged for an 014 wire.  I then selected shockwave lithotripsy.  The below-knee popliteal artery was then treated with a 4.5 mm x 60 mm shockwave lithotripsy balloon inflated to 4 atm x 210 pulses.  Widely patent vessel at completion.  Preserved runoff in the PT.  Short 5 French sheath was placed in the left groin and removed wires and catheters.  Mynx closure was deployed.  Taken to holding in stable condition.  Plan: Excellent results after right leg intervention.  Optimized from vascular.  Continue Plavix as well as he can restart his Coumadin tonight.  Refuses statin therapy.  Will see in 1 month.  Cephus Shelling, MD [facility] Office: [phone]

## 2024-01-06 NOTE — Progress Notes (Signed)
 Patient and daughter was given discharge instructions. Both verbalized understanding.

## 2024-01-06 NOTE — H&P (Signed)
 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  The various methods of treatment have been discussed with the patient and family. After consideration of risks, benefits and other options for treatment, the patient has consented to  Procedure(s): ABDOMINAL AORTOGRAM W/LOWER EXTREMITY (N/A) as a surgical intervention.  The patient's history has been reviewed, patient examined, no change in status, stable for surgery.  I have reviewed the patient's chart and labs.  Questions were answered to the patient's satisfaction.     Cephus Shelling     Patient name: [name]      MRN: [id number]        DOB: 1951-09-27        Sex: male   REASON FOR CONSULT: 6 month follow-up for PAD/CLI   HPI: [name] is a 73 y.o. male, with history of atrial fibrillation on coumadin, congestive heart failure, CKD, diabetes, hypertension, PVD that presents for 6 month follow-up for surveillance PAD.  Daughter is here today states he has larger Achilles wound on the right foot that got worse after his shoe rubbed a bigger lesion.  He is going to the wound clinic.   Patient is well-known to our practice.  He had previous left peroneal angioplasty on 05/19/2021 followed by left fifth toe amputation on 05/22/2021 that healed.  He then had a right PT angioplasty with retrograde pedal access on 06/11/2022 also for tissue loss.  Again on 12/10/2022 he had another left peroneal angioplasty.           Past Medical History:  Diagnosis Date   A-fib [facility])      a. (06/04/14) TEE-DC-CV; succesful; large LA 6.2 cm   ADHD     Ascites     Athlete's foot     Chronic systolic heart failure (HCC)      a. 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, unspecified CKD stage, unspecified whether long term insulin use (HCC)     Heart murmur     HTN (hypertension)     OSA (obstructive sleep apnea)     PVD (peripheral vascular disease) (HCC)      s/p great toe amputation               Past Surgical History:  Procedure Laterality Date   ABDOMINAL AORTOGRAM W/LOWER EXTREMITY N/A 05/19/2021    Procedure: ABDOMINAL AORTOGRAM W/LOWER EXTREMITY;  Surgeon: Cephus Shelling, MD;  Location: [facility] INVASIVE CV LAB;  Service: Cardiovascular;  Laterality: N/A;   ABDOMINAL AORTOGRAM W/LOWER EXTREMITY Right 06/11/2022    Procedure: ABDOMINAL AORTOGRAM W/LOWER EXTREMITY;  Surgeon: Cephus Shelling, MD;  Location: [facility] INVASIVE CV LAB;  Service: Cardiovascular;  Laterality: Right;   ABDOMINAL AORTOGRAM W/LOWER EXTREMITY Left 12/10/2022    Procedure: ABDOMINAL AORTOGRAM W/LOWER EXTREMITY;  Surgeon: Cephus Shelling, MD;  Location: [facility] INVASIVE CV LAB;  Service: Cardiovascular;  Laterality: Left;   AMPUTATION Left 05/22/2021    Procedure: LEFT FIFTH TOE AMPUTATION;  Surgeon: Cephus Shelling, MD;  Location: [facility] OR;  Service: Vascular;  Laterality: Left;   CARDIOVERSION N/A 06/04/2014    Procedure: CARDIOVERSION;  Surgeon: Laurey Morale, MD;  Location: [facility] ENDOSCOPY;  Service: Cardiovascular;  Laterality: N/A;   CARDIOVERSION N/A 11/06/2021    Procedure: CARDIOVERSION;  Surgeon: Laurey Morale, MD;  Location: [facility] ENDOSCOPY;  Service: Cardiovascular;  Laterality: N/A;   NOSE SURGERY        Nasal septum surgery   PERIPHERAL VASCULAR BALLOON ANGIOPLASTY   06/11/2022    Procedure: PERIPHERAL VASCULAR BALLOON ANGIOPLASTY;  Surgeon: Cephus Shelling, MD;  Location: [facility];  Service: Cardiovascular;;   PERIPHERAL VASCULAR BALLOON ANGIOPLASTY Left 12/10/2022    Procedure: PERIPHERAL VASCULAR BALLOON ANGIOPLASTY;  Surgeon: Cephus Shelling, MD;  Location: [facility];  Service:  Cardiovascular;  Laterality: Left;  Peroneal   PERIPHERAL VASCULAR INTERVENTION Left 05/19/2021    Procedure: PERIPHERAL VASCULAR INTERVENTION;  Surgeon: Cephus Shelling, MD;  Location: [facility];  Service: Cardiovascular;  Laterality: Left;   TEE WITHOUT CARDIOVERSION N/A 06/04/2014    Procedure: TRANSESOPHAGEAL ECHOCARDIOGRAM (TEE);  Surgeon: Laurey Morale, MD;  Location: [facility] ENDOSCOPY;  Service: Cardiovascular;  Laterality: N/A;   TEE WITHOUT CARDIOVERSION N/A 01/06/2016    Procedure: TRANSESOPHAGEAL ECHOCARDIOGRAM (TEE);  Surgeon: Pricilla Riffle, MD;  Location: [facility] ENDOSCOPY;  Service: Cardiovascular;  Laterality: N/A;   TEE WITHOUT CARDIOVERSION N/A 11/06/2021    Procedure: TRANSESOPHAGEAL ECHOCARDIOGRAM (TEE);  Surgeon: Laurey Morale, MD;  Location: [facility] ENDOSCOPY;  Service: Cardiovascular;  Laterality: N/A;   VASECTOMY                   Family History  Problem Relation Age of Onset   Heart attack Mother          deceased   Diabetes Mother     Heart attack Sister            SOCIAL HISTORY: Social History         Socioeconomic History   Marital status: Single      Spouse name: Not on file   Number of children: Not on file   Years of education: Not on file   Highest education level: Not on file  Occupational History   Occupation: retired  Tobacco Use   Smoking status: Some Days      Current packs/day: 0.00      Types: Cigarettes, Cigars      Start date: 05/1969      Last attempt to quit: 05/2021      Years since quitting: 2.6      Passive exposure: Never   Smokeless tobacco: Never  Vaping Use   Vaping status: Never Used  Substance and Sexual Activity   Alcohol use: Not Currently   Drug use: Never   Sexual activity: Not on file  Other Topics Concern   Not on file  Social History Narrative    ** Merged History Encounter ** Lives in [place] by himself. Retired from [employer] for [employer].          Tobacco use, amount per day  now:    Past tobacco use, amount per day:    How many years did you use tobacco:    Alcohol use (drinks per week): N/A    Diet:    Do you drink/eat things with caffeine:    Marital status:  Divorced                                What year were you married? 2003  Do you live in a house, apartment, assisted living, condo, trailer, etc.? House    Is it one or more stories? One    How many persons live in your home?    Do you have pets in your home?( please list) N/A    Highest Level of education completed? Bachelors Degree    Current or past profession: Teacher-Special Ed    Do you exercise?  Yes                                Type and how often? Daily squats, and push ups.    Do you have a living will? No    Do you have a DNR form?  No                                 If not, do you want to discuss one?    Do you have signed POA/HPOA forms?  No                      If so, please bring to you appointment         Do you have any difficulty bathing or dressing yourself? Bathing only if pants not shirts/not shorts.    Do you have any difficulty preparing food or eating? No    Do you have any difficulty managing your medications? No    Do you have any difficulty managing your finances? No    Do you have any difficulty affording your medications? No    Social Drivers of Acupuncturist Strain: Not on file  Food Insecurity: No Food Insecurity (11/10/2021)    Hunger Vital Sign     Worried About Running Out of Food in the Last Year: Never true     Ran Out of Food in the Last Year: Never true  Transportation Needs: No Transportation Needs (11/10/2021)    PRAPARE - Therapist, art (Medical): No     Lack of Transportation (Non-Medical): No  Physical Activity: Not on file  Stress: Not on file  Social Connections: Not on file  Intimate Partner Violence: Not on file      Allergies  No Known Allergies           Current Outpatient Medications   Medication Sig Dispense Refill   Accu-Chek Softclix Lancets lancets 1 each by Other route daily. As instructed E11.22 100 each 11   Blood Glucose Monitoring Suppl (ACCU-CHEK AVIVA PLUS) w/Device KIT 1 Device by Does not apply route daily. E11.22 1 kit 11   clopidogrel (PLAVIX) 75 MG tablet Take 1 tablet (75 mg total) by mouth daily. 30 tablet 11   diclofenac Sodium (VOLTAREN) 1 % GEL Apply 1 Application topically 4 (four) times daily as needed (pain).       furosemide (LASIX) 20 MG tablet Take 1 tablet (20 mg total) by mouth daily. One Tablet by mouth in the morning. 30 tablet 4   glucose blood (ACCU-CHEK AVIVA PLUS) test strip 1 each by Other route daily. As instructed E11.22 100 each 11   Insulin Syringe-Needle U-100 (TRUEPLUS INSULIN SYRINGE) 30G X 5/16" 1 ML MISC USE ONCE DAILY FOR INSULIN INJECTIONS. 100 each 2   JARDIANCE 10 MG TABS tablet TAKE 1 TABLET(10 MG) BY MOUTH DAILY  30 tablet 2   LANTUS 100 UNIT/ML injection INJECT 8 UNITS UNDER THE SKIN ONCE DAILY AS DIRECTED (Patient taking differently: Inject 8 Units into the skin daily.) 10 mL 11   losartan (COZAAR) 25 MG tablet Take 1 tablet (25 mg total) by mouth daily. 90 tablet 3   meclizine (ANTIVERT) 25 MG tablet Take 1 tablet (25 mg total) by mouth 3 (three) times daily as needed for dizziness. 30 tablet 0   metoprolol succinate (TOPROL XL) 50 MG 24 hr tablet Take 1 tablet (50 mg total) by mouth daily. 90 tablet 0   metoprolol tartrate (LOPRESSOR) 25 MG tablet Take 1 tablet (25 mg total) by mouth every evening. 90 tablet 0   Misc Natural Products (JOINT SUPPORT PO) Take 1 Dose by mouth 3 (three) times a week.       spironolactone (ALDACTONE) 25 MG tablet TAKE 1 TABLET(25 MG) BY MOUTH DAILY 90 tablet 2   warfarin (COUMADIN) 6 MG tablet Take 1-2 tablets daily or as prescribed by Coumadin Clinic 30 tablet 2      No current facility-administered medications for this visit.        REVIEW OF SYSTEMS:  [X]  denotes positive finding, [ ]   denotes negative finding Cardiac   Comments:  Chest pain or chest pressure:      Shortness of breath upon exertion:      Short of breath when lying flat:      Irregular heart rhythm:             Vascular      Pain in calf, thigh, or hip brought on by ambulation:      Pain in feet at night that wakes you up from your sleep:       Blood clot in your veins:      Leg swelling:              Pulmonary      Oxygen at home:      Productive cough:       Wheezing:              Neurologic      Sudden weakness in arms or legs:       Sudden numbness in arms or legs:       Sudden onset of difficulty speaking or slurred speech:      Temporary loss of vision in one eye:       Problems with dizziness:              Gastrointestinal      Blood in stool:       Vomited blood:              Genitourinary      Burning when urinating:       Blood in urine:             Psychiatric      Major depression:              Hematologic      Bleeding problems:      Problems with blood clotting too easily:             Skin      Rashes or ulcers:             Constitutional      Fever or chills:          PHYSICAL EXAM: There were no vitals filed for this visit.  GENERAL: The patient is a well-nourished male, in no acute distress. The vital signs are documented above. CARDIAC: There is a regular rate and rhythm.  VASCULAR:  Bilateral common femoral pulses palpable Right achilles wound pictured Left 5th toe amp previously well healed No palpable pedal pulses PULMONARY: No respiratory distress.      DATA:    ABIs 0.81 on the right monophasic and 0.93 on the left biphasic   Bilateral lower extremity arterial duplex with no obvious stenosis although significantly calcified with circumferential calcification   Assessment/Plan:   73 y.o. male, with history of atrial fibrillation, congestive heart failure, CKD, diabetes, hypertension, PVD that presents for 6 month follow-up for  surveillance PAD.  Patient is well-known to our practice.  He had previous left peroneal angioplasty on 05/19/2021 followed by left fifth toe amputation on 05/22/2021 that healed.  He then had a right PT angioplasty with retrograde pedal access on 06/11/2022 also for tissue loss.  Again on 12/10/2022 he had another left peroneal angioplasty.   Now with a right Achilles wound that is larger after his shoe rubbed a wound on the lesion.  He does not have palpable right pedal pulses.  He has monophasic waveforms at the ankle.  Last intervention was in 2023.  He has marginal toe pressure on the right in the 60s that will make wound healing more difficult.  I think he merits a new right lower extremity angiogram with possible tibial intervention.  Will schedule in the Cath Lab.  Will need to hold his Coumadin.  Risk benefits discussed.  Discussed that Achilles wounds are very difficult to heal even with intervention.    Cephus Shelling, MD [facility] Office: [phone]

## 2024-01-17 ENCOUNTER — Other Ambulatory Visit: Payer: Self-pay | Admitting: Family

## 2024-01-17 ENCOUNTER — Telehealth: Payer: Self-pay | Admitting: Physician Assistant

## 2024-01-17 NOTE — Telephone Encounter (Signed)
 Pt family member called the after-hours line to report increased right lower extremity swelling, this has been ongoing since January and prior to his recent LE vascular intervention with [facility] in 2/20025. He is not short of breath and is not in distress, although I did not speak directly to the patient. She has been giving him 10 mg lasix BID instead of 20 mg daily at once. I advised he will need at least 20 mg this morning. He has not yet taken morning medication. She requests a call back from [facility] clinic team and likely appt today. I gave ER precautions and let her know I will have someone reach out regarding an appt.   She expressed understanding.

## 2024-01-18 NOTE — Telephone Encounter (Signed)
 Returned call to daughter  Daughter report severe leg edema R>L No weights to report  No vitals to report No SOB or CP  Reports he often takes lasix 10 mg BID, opposed to 20 mg daily   Reports compliance with fluid restrictions No compression hoses    Add on appt to discuss further 3/17 @ 1030 Please advise if needed before follolwup

## 2024-01-19 ENCOUNTER — Ambulatory Visit (HOSPITAL_BASED_OUTPATIENT_CLINIC_OR_DEPARTMENT_OTHER): Payer: Medicare HMO | Admitting: Internal Medicine

## 2024-01-20 NOTE — Telephone Encounter (Addendum)
 Returned call  Daughter reports swelling has gotten better with elevation and would like to wait to OV to discuss further   *no med changes are needed at this

## 2024-01-24 ENCOUNTER — Other Ambulatory Visit: Payer: Self-pay

## 2024-01-24 ENCOUNTER — Ambulatory Visit (HOSPITAL_COMMUNITY)
Admission: RE | Admit: 2024-01-24 | Discharge: 2024-01-24 | Disposition: A | Discharge status: Home / Self Care | Source: Ambulatory Visit | Attending: Cardiology | Admitting: Cardiology

## 2024-01-24 ENCOUNTER — Encounter (HOSPITAL_COMMUNITY): Payer: Self-pay

## 2024-01-24 VITALS — BP 140/96 | HR 85 | Wt 230.0 lb

## 2024-01-24 DIAGNOSIS — I4892 Unspecified atrial flutter: Secondary | ICD-10-CM | POA: Diagnosis not present

## 2024-01-24 DIAGNOSIS — N183 Chronic kidney disease, stage 3 unspecified: Secondary | ICD-10-CM | POA: Insufficient documentation

## 2024-01-24 DIAGNOSIS — Z7902 Long term (current) use of antithrombotics/antiplatelets: Secondary | ICD-10-CM | POA: Insufficient documentation

## 2024-01-24 DIAGNOSIS — G4733 Obstructive sleep apnea (adult) (pediatric): Secondary | ICD-10-CM | POA: Insufficient documentation

## 2024-01-24 DIAGNOSIS — Z794 Long term (current) use of insulin: Secondary | ICD-10-CM | POA: Insufficient documentation

## 2024-01-24 DIAGNOSIS — E669 Obesity, unspecified: Secondary | ICD-10-CM | POA: Diagnosis not present

## 2024-01-24 DIAGNOSIS — I5022 Chronic systolic (congestive) heart failure: Secondary | ICD-10-CM | POA: Insufficient documentation

## 2024-01-24 DIAGNOSIS — I480 Paroxysmal atrial fibrillation: Secondary | ICD-10-CM | POA: Diagnosis not present

## 2024-01-24 DIAGNOSIS — I739 Peripheral vascular disease, unspecified: Secondary | ICD-10-CM

## 2024-01-24 DIAGNOSIS — E1151 Type 2 diabetes mellitus with diabetic peripheral angiopathy without gangrene: Secondary | ICD-10-CM | POA: Diagnosis not present

## 2024-01-24 DIAGNOSIS — F329 Major depressive disorder, single episode, unspecified: Secondary | ICD-10-CM | POA: Insufficient documentation

## 2024-01-24 DIAGNOSIS — R600 Localized edema: Secondary | ICD-10-CM | POA: Diagnosis present

## 2024-01-24 DIAGNOSIS — E1122 Type 2 diabetes mellitus with diabetic chronic kidney disease: Secondary | ICD-10-CM | POA: Insufficient documentation

## 2024-01-24 DIAGNOSIS — I130 Hypertensive heart and chronic kidney disease with heart failure and stage 1 through stage 4 chronic kidney disease, or unspecified chronic kidney disease: Secondary | ICD-10-CM | POA: Diagnosis not present

## 2024-01-24 LAB — BRAIN NATRIURETIC PEPTIDE: B Natriuretic Peptide: 247 pg/mL — ABNORMAL HIGH (ref 0.0–100.0)

## 2024-01-24 LAB — BASIC METABOLIC PANEL
Anion gap: 7 (ref 5–15)
BUN: 32 mg/dL — ABNORMAL HIGH (ref 8–23)
CO2: 25 mmol/L (ref 22–32)
Calcium: 9.5 mg/dL (ref 8.9–10.3)
Chloride: 108 mmol/L (ref 98–111)
Creatinine, Ser: 1.75 mg/dL — ABNORMAL HIGH (ref 0.61–1.24)
GFR, Estimated: 41 mL/min — ABNORMAL LOW (ref >60)
Glucose, Bld: 166 mg/dL — ABNORMAL HIGH (ref 70–99)
Potassium: 4.5 mmol/L (ref 3.5–5.1)
Sodium: 140 mmol/L (ref 135–145)

## 2024-01-24 MED ORDER — METOPROLOL TARTRATE 25 MG PO TABS: 25.0000 mg | ORAL_TABLET | Freq: Every evening | ORAL | 0 refills | Status: DC

## 2024-01-24 MED ORDER — FUROSEMIDE 20 MG PO TABS: 40.0000 mg | ORAL_TABLET | Freq: Every day | ORAL | 3 refills | Status: DC

## 2024-01-24 NOTE — Patient Instructions (Signed)
 Increase Lasix to 40 mg daily - updated Rx sent to your pharmacy. Labs today - will call you if abnormal. Return to see Dr. Gala Romney in 3 months. Please call us in May to schedule this appointment. Please call us at [phone] if any questions or concerns prior to your next visit.

## 2024-01-24 NOTE — Progress Notes (Signed)
 Patient ID: [name], male   DOB: 1951-10-10, 73 y.o.   MRN: [id number]    Advanced Heart Failure Clinic Note   PCP: [facility] Primary Cardiologist: Dr. Tresa Endo HF Cardiologist: Dr. Gala Romney  Reason for visit: b/l LEE   HPI: [name] is a 73 y.o. male with PAF, severe depression, DM2, CKD stage III, obesity, OSA and chronic systolic HF.  Admitted 6/15 with dyspena and LE edema. Found to be in afib with RVR.  Admitted 12/2015 wit volume overload/Afib RVR  in the setting of medication compliance. Had TEE but DC-CV was cancelled due to LA thrombus. Discharge weight 189 pounds.   Admitted 03/2016 with  A fib RVR and volume overload in the setting of medication noncompliance. Refused cath, Myoview 12/2015. EF 20% with defects noted on rest and stress images within the inferior wall and to lesser extent the septum suggest ischemic etiology. No reversibility. Psychiatry met with him and he was deemed to have the ability to make decisions.  Seen in the HF clinic in 2021. EF had normalized.    Admitted 12/22 with ADHF. Echo EF 45-50%. Diuresed 19 liters. Transitioned to lasix 20 mg daily. Hospital course c/b AKI and A F Underwent TEE-DC-CV with conversion to NSR. He was continued on amiodarone 200 mg daily. He was back in AF on discharge.  Discharge weight 203 pounds.   Admitted 2//23 after mechanical fall resulting in rib fractures and PTX.  AC stopped due to risk of fall. Discharged 12/15/21.   In 8/23 had critical limb ischemia of RLE with non-healing ulcer -> underwent right posterior tibial angioplasty by Dr. Izora Ribas he has been in AFL since at least 1/24. Was seen in Gen cardiology clinic and offered DCCV but he refused.  At return f/u was still in AF with RVR. Refused DC-CV or dig. Toprol increased. Refused repeat echo or adding any meds.   Follow up 5/24, NYHA II and volume stable. Smoking 1/2 ppd. LE wounds healed. Recommended he get repeat  echo.  2/25 underwent repeat LE angiogram by Dr. Chestine Spore for critical limb ischemia and tissue loss (rt achilles wound). Angiogram showed the right common femoral, profunda and SFA were widely patent.  He had a high-grade 90% below-knee popliteal focal stenosis.  Dominant runoff in the right lower extremity is through the posterior tibial and proximally there was a high-grade 99% stenosis at the ostium with a diffusely diseased vessel proximally.  The peroneal was patent but then occluded in the distal calf.  The anterior tibial was occluded. Ultimately the posterior tibial stenosis including ostium was treated with percutaneous intervention with widely patent vessel at completion.  The below-knee popliteal artery was then treated with shockwave lithotripsy w/ widely patent vessel at completion.  Preserved runoff into the foot.  Pt now presents to clinic as a work in given complaints of LEE. Pt's daughter called on 3/11 w/ complaints of LEE. Taking Lasix 20 mg daily. He denies resting dyspnea but has some abdominal distention. He is wearing light compression wraps. His daughter reports his edema has improved since wearing his wraps. BP today is 140/96 but has not yet taken AM meds.     Cardiac Testing  - TEE (12/22): EF 35% No PFO . No thrombus.  - DC-CV (12/22)--> NSR  - Echo (12/22): EF 45-50% - Echo (2021): EF 55-60 - Echo (5/17): EF 15-20%  - Myoview 2/17 Perfusion: Next defects noted on rest and stress images within the inferior  wall and to lesser extent the septum. No reversible defects. Wall Motion: Severe generalized hypokinesia. Left ventricle is dilute Left Ventricular Ejection Fraction: 20 % End diastolic volume 218 ml End systolic volume 173 ml  1. 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 No evidence of ischemia. 2. Severe generalized hypokinesia with dilated left ventricle. 3. Left ventricular ejection fraction 20% 4. High-risk stress test  findings*.  ROS: All systems negative except as listed in HPI, PMH and Problem List.  SH:  Social History   Socioeconomic History   Marital status: Divorced    Spouse name: Not on file   Number of children: Not on file   Years of education: Not on file   Highest education level: Not on file  Occupational History   Occupation: retired  Tobacco Use   Smoking status: Some Days    Current packs/day: 0.00    Types: Cigarettes, Cigars    Start date: 05/1969    Last attempt to quit: 05/2021    Years since quitting: 2.7    Passive exposure: Never   Smokeless tobacco: Never  Vaping Use   Vaping status: Never Used  Substance and Sexual Activity   Alcohol use: Not Currently   Drug use: Never   Sexual activity: Not on file  Other Topics Concern   Not on file  Social History Narrative   ** Merged History Encounter ** Lives in [place] by himself. Retired from [employer] for [employer].       Tobacco use, amount per day now:   Past tobacco use, amount per day:   How many years did you use tobacco:   Alcohol use (drinks per week): N/A   Diet:   Do you drink/eat things with caffeine:   Marital status:  Divorced                                What year were you married? 2003   Do you live in a house, apartment, assisted living, condo, trailer, etc.? House   Is it one or more stories? One   How many persons live in your home?   Do you have pets in your home?( please list) N/A   Highest Level of education completed? Bachelors Degree   Current or past profession: Teacher-Special Ed   Do you exercise?  Yes                                Type and how often? Daily squats, and push ups.   Do you have a living will? No   Do you have a DNR form?  No                                 If not, do you want to discuss one?   Do you have signed POA/HPOA forms?  No                      If so, please bring to you appointment      Do you have any difficulty bathing or dressing  yourself? Bathing only if pants not shirts/not shorts.   Do you have any difficulty preparing food or eating? No   Do you have any difficulty managing your medications?  No   Do you have any difficulty managing your finances? No   Do you have any difficulty affording your medications? No   Social Drivers of Corporate investment banker Strain: Not on file  Food Insecurity: No Food Insecurity (11/10/2021)   Hunger Vital Sign    Worried About Running Out of Food in the Last Year: Never true    Ran Out of Food in the Last Year: Never true  Transportation Needs: No Transportation Needs (11/10/2021)   PRAPARE - Administrator, Civil Service (Medical): No    Lack of Transportation (Non-Medical): No  Physical Activity: Not on file  Stress: Not on file  Social Connections: Not on file  Intimate Partner Violence: Not on file   FH:  Family History  Problem Relation Age of Onset   Heart attack Mother        deceased   Diabetes Mother    Heart attack Sister    Past Medical History:  Diagnosis Date   A-fib [facility])    a. (06/04/14) TEE-DC-CV; succesful; large LA 6.2 cm   ADHD    Ascites    Athlete's foot    Chronic systolic heart failure (HCC)    a. 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, unspecified CKD stage, unspecified whether long term insulin use (HCC)    Heart murmur    HTN (hypertension)    OSA (obstructive sleep apnea)    PVD (peripheral vascular disease) (HCC)    s/p great toe amputation   Current Outpatient Medications  Medication Sig Dispense Refill   Accu-Chek Softclix Lancets lancets 1 each by Other route daily. As instructed E11.22 100 each 11   Blood Glucose Monitoring Suppl (ACCU-CHEK AVIVA PLUS) w/Device KIT 1 Device by Does not apply route daily. E11.22 1  kit 11   clopidogrel (PLAVIX) 75 MG tablet Take 1 tablet (75 mg total) by mouth daily. 30 tablet 11   diclofenac Sodium (VOLTAREN) 1 % GEL Apply 1 Application topically 4 (four) times daily as needed (pain).     empagliflozin (JARDIANCE) 10 MG TABS tablet TAKE 1 TABLET(10 MG) BY MOUTH DAILY 30 tablet 0   furosemide (LASIX) 20 MG tablet Take 1 tablet (20 mg total) by mouth daily. One Tablet by mouth in the morning. (Patient taking differently: Take 10-20 mg by mouth See admin instructions. 20 mg in the morning, 10 mg in the evening) 30 tablet 4   glucose blood (ACCU-CHEK AVIVA PLUS) test strip 1 each by Other route daily. As instructed E11.22 100 each 11   Insulin Syringe-Needle U-100 (TRUEPLUS INSULIN SYRINGE) 30G X 5/16" 1 ML MISC USE ONCE DAILY FOR INSULIN INJECTIONS. 100 each 2   LANTUS 100 UNIT/ML injection INJECT 8 UNITS UNDER THE SKIN ONCE DAILY AS DIRECTED (Patient taking differently: Inject 8 Units into the skin daily.) 10 mL 11   losartan (COZAAR) 25 MG tablet Take 25 mg by mouth daily.     meclizine (ANTIVERT) 25 MG tablet Take 1 tablet (25 mg total) by mouth 3 (three) times daily as needed for dizziness. 30 tablet 0   metoprolol succinate (TOPROL XL) 50 MG 24 hr tablet Take 1 tablet (50 mg total) by mouth daily. 90 tablet 0   metoprolol tartrate (LOPRESSOR) 25 MG tablet  Take 1 tablet (25 mg total) by mouth every evening. 90 tablet 0   Misc Natural Products (JOINT SUPPORT PO) Take 1 Dose by mouth 3 (three) times a week.     spironolactone (ALDACTONE) 25 MG tablet TAKE 1 TABLET(25 MG) BY MOUTH DAILY 90 tablet 2   thiamine (VITAMIN B-1) 100 MG tablet Take 100 mg by mouth daily.     warfarin (COUMADIN) 6 MG tablet Take 1-2 tablets daily or as prescribed by Coumadin Clinic (Patient taking differently: Take 3-6 mg by mouth See admin instructions. Take 1-2 tablets daily or as prescribed by Coumadin Clinic; 6 mg Wed and Fri, 3 mg all other days) 30 tablet 2   No current facility-administered  medications for this visit.   There were no vitals taken for this visit.  Wt Readings from Last 3 Encounters:  01/06/24 98.9 kg (218 lb)  12/28/23 105.4 kg (232 lb 6.4 oz)  07/26/23 87.3 kg (192 lb 6.4 oz)   PHYSICAL EXAM: General:  Well appearing. No respiratory difficulty HEENT: normal Neck: supple. no JVD. Carotids 2+ bilat; no bruits. No lymphadenopathy or thyromegaly appreciated. Cor: PMI nondisplaced. Regular rate & rhythm. No rubs, gallops or murmurs. Lungs: clear Abdomen: soft, nontender, +distended. No hepatosplenomegaly. No bruits or masses. Good bowel sounds. Extremities: no cyanosis, clubbing, rash, trace b/l LE edema Neuro: alert & oriented x 3, cranial nerves grossly intact. moves all 4 extremities w/o difficulty. Affect pleasant.  ECG: not performed   ASSESSMENT & PLAN:  1. Chronic HFmEF - Admitted 12/22 with marked volume overload. Not taking medications as prescribed. - Myoview 12/2015. EF 20% with defects noted on rest and stress images within the inferior wall and to lesser extent the septum suggest ischemic etiology. No reversibility. Refused cath multiple times. No s/s angina currently.  - Echo (10/21): EF 55-60%. - Echo (12/22): EF 45-50%, RV moderately reduced, RVSP 85 mmHg - Echo (2/24): EF 45-50%. RV normal  - NYHA II-IIb, confounded by physical deconditioning. Mildly volume overloaded w/ leg edema - Increase Lasix to 40 mg daily, encouraged leg elevation w/ standing  - Continue losartan 25 mg daily  - Continue Toprol XL 50 mg daily  - Continue spiro 25 mg daily  - Continue Jardiance 10 mg daily. - Check BMP and BNP today   2. Chronic AF/ AFL - rate controlled w/ metoprolol, continue  - Continue warfarin. Followed by Coumadin Clinic.   3. CKD Stage IIIa-IIIb - Baseline SCr 1.5-1.7  - on SGLT2i, Jardiance - check BMP today   4. DMII - followed by PCP - on insulin, SGLT2i   5. HTN - mildly elevated but prior to taking AM meds today - continue  current regimen, no change   6.  H/o LV Thrombus - Resolved - On warfarin for AF/AFL  7. PAD/ Chronic LE Wounds  - Followed by vascular and podiatry for right heel wound - recent percutaneous intervention by Dr. Chestine Spore as outlined above 2/25  Follow w/ Dr. Gala Romney in 3 months.   Milynn Quirion PA-C 9:05 AM

## 2024-01-26 ENCOUNTER — Encounter (HOSPITAL_BASED_OUTPATIENT_CLINIC_OR_DEPARTMENT_OTHER): Attending: General Surgery | Admitting: General Surgery

## 2024-01-26 DIAGNOSIS — E1151 Type 2 diabetes mellitus with diabetic peripheral angiopathy without gangrene: Secondary | ICD-10-CM | POA: Diagnosis not present

## 2024-01-26 DIAGNOSIS — L97212 Non-pressure chronic ulcer of right calf with fat layer exposed: Secondary | ICD-10-CM | POA: Diagnosis not present

## 2024-01-26 DIAGNOSIS — L97312 Non-pressure chronic ulcer of right ankle with fat layer exposed: Secondary | ICD-10-CM | POA: Diagnosis not present

## 2024-01-26 DIAGNOSIS — E11622 Type 2 diabetes mellitus with other skin ulcer: Secondary | ICD-10-CM | POA: Diagnosis not present

## 2024-01-28 ENCOUNTER — Ambulatory Visit: Payer: Medicare HMO

## 2024-02-01 ENCOUNTER — Ambulatory Visit (HOSPITAL_COMMUNITY)
Admission: RE | Admit: 2024-02-01 | Discharge: 2024-02-01 | Disposition: A | Discharge status: Home / Self Care | Payer: Medicare HMO | Source: Ambulatory Visit | Attending: Vascular Surgery | Admitting: Vascular Surgery

## 2024-02-01 ENCOUNTER — Ambulatory Visit (INDEPENDENT_AMBULATORY_CARE_PROVIDER_SITE_OTHER)
Admit: 2024-02-01 | Discharge: 2024-02-01 | Disposition: A | Discharge status: Home / Self Care | Payer: Medicare HMO | Attending: Vascular Surgery

## 2024-02-01 ENCOUNTER — Ambulatory Visit (INDEPENDENT_AMBULATORY_CARE_PROVIDER_SITE_OTHER)

## 2024-02-01 DIAGNOSIS — I739 Peripheral vascular disease, unspecified: Secondary | ICD-10-CM

## 2024-02-01 DIAGNOSIS — I513 Intracardiac thrombosis, not elsewhere classified: Secondary | ICD-10-CM | POA: Insufficient documentation

## 2024-02-01 DIAGNOSIS — Z5181 Encounter for therapeutic drug level monitoring: Secondary | ICD-10-CM

## 2024-02-01 DIAGNOSIS — I483 Typical atrial flutter: Secondary | ICD-10-CM | POA: Insufficient documentation

## 2024-02-01 LAB — VAS US ABI WITH/WO TBI
Left ABI: 0.95
Right ABI: 1.02

## 2024-02-01 LAB — POCT INR: INR: 2.4 (ref 2.0–3.0)

## 2024-02-01 NOTE — Patient Instructions (Signed)
 Description   Continue taking warfarin 1/2 tablet daily except for 1 tablet on Wednesdays, and Fridays.  Recheck INR in 6 weeks.  Coumadin Clinic [phone]

## 2024-02-04 ENCOUNTER — Other Ambulatory Visit: Payer: Self-pay | Admitting: Family

## 2024-02-04 ENCOUNTER — Encounter (HOSPITAL_COMMUNITY): Payer: Self-pay | Admitting: *Deleted

## 2024-02-07 ENCOUNTER — Telehealth (HOSPITAL_COMMUNITY): Payer: Self-pay | Admitting: *Deleted

## 2024-02-07 ENCOUNTER — Other Ambulatory Visit: Payer: Self-pay

## 2024-02-07 ENCOUNTER — Other Ambulatory Visit (HOSPITAL_COMMUNITY): Payer: Self-pay | Admitting: *Deleted

## 2024-02-07 ENCOUNTER — Other Ambulatory Visit (HOSPITAL_COMMUNITY): Payer: Self-pay

## 2024-02-07 DIAGNOSIS — N183 Chronic kidney disease, stage 3 unspecified: Secondary | ICD-10-CM

## 2024-02-07 DIAGNOSIS — I5022 Chronic systolic (congestive) heart failure: Secondary | ICD-10-CM

## 2024-02-07 MED ORDER — WARFARIN SODIUM 6 MG PO TABS
ORAL_TABLET | ORAL | 2 refills | Status: DC
  Filled 2024-02-07: qty 30, 15d supply, fill #0

## 2024-02-07 MED ORDER — EMPAGLIFLOZIN 10 MG PO TABS: 10.0000 mg | ORAL_TABLET | Freq: Every day | ORAL | 11 refills | Status: DC

## 2024-02-07 NOTE — Telephone Encounter (Signed)
-----   Message from [place] sent at 01/24/2024  1:21 PM EDT ----- Renal fx and SCr remain stable. BNP (fluid marker) elevated. Continue w/ plans to increase Lasix to 40 mg daily. Repeat BMP/BNP in 10 days.

## 2024-02-07 NOTE — Telephone Encounter (Signed)
 Spoke w/pt's daughter, she is aware and agreeable, will take pt to [facility] this week, order placed

## 2024-02-08 ENCOUNTER — Ambulatory Visit (INDEPENDENT_AMBULATORY_CARE_PROVIDER_SITE_OTHER): Payer: Medicare HMO | Admitting: Physician Assistant

## 2024-02-08 VITALS — BP 178/109 | HR 98 | Temp 98.0°F | Resp 18 | Ht 68.0 in | Wt 227.3 lb

## 2024-02-08 DIAGNOSIS — I70221 Atherosclerosis of native arteries of extremities with rest pain, right leg: Secondary | ICD-10-CM

## 2024-02-08 NOTE — Progress Notes (Signed)
 Office Note   History of Present Illness   [name] is a 73 y.o. (Feb 19, 1951) male who presents for follow-up.  He recently underwent right lower extremity angiogram with right below-knee popliteal artery shockwave lithotripsy and posterior tibial artery angioplasty on 01/06/2024 by Dr. Chestine Spore.  This was done for critical limb ischemia with a slow to heal right Achilles wound.  He is being followed by the wound care center.  He returns today for follow-up.  He denies any rest pain in his right foot.  He states overall he is doing well.  His daughter continues to do dressing changes at home for his right achilles wound.  The wound care center also reports that his wound is making good progress.  Family was not present at today's visit due to an emergency.  Of note the patient at baseline will have tangential conversations with himself and others.  These conversations are seemingly random.  He is oriented to self, place, time, and reason as to why he is at this appointment.  Current Outpatient Medications  Medication Sig Dispense Refill   Accu-Chek Softclix Lancets lancets 1 each by Other route daily. As instructed E11.22 100 each 11   Blood Glucose Monitoring Suppl (ACCU-CHEK AVIVA PLUS) w/Device KIT 1 Device by Does not apply route daily. E11.22 1 kit 11   clopidogrel (PLAVIX) 75 MG tablet Take 1 tablet (75 mg total) by mouth daily. 30 tablet 11   diclofenac Sodium (VOLTAREN) 1 % GEL Apply 1 Application topically 4 (four) times daily as needed (pain).     empagliflozin (JARDIANCE) 10 MG TABS tablet Take 1 tablet (10 mg total) by mouth daily. 30 tablet 11   furosemide (LASIX) 20 MG tablet Take 2 tablets (40 mg total) by mouth daily. One Tablet by mouth in the morning. 180 tablet 3   glucose blood (ACCU-CHEK AVIVA PLUS) test strip 1 each by Other route daily. As instructed E11.22 100 each 11   Insulin Syringe-Needle U-100 (TRUEPLUS INSULIN SYRINGE) 30G X 5/16" 1 ML MISC USE ONCE DAILY  FOR INSULIN INJECTIONS. 100 each 2   LANTUS 100 UNIT/ML injection INJECT 8 UNITS UNDER THE SKIN ONCE DAILY AS DIRECTED 10 mL 11   losartan (COZAAR) 25 MG tablet Take 25 mg by mouth daily.     metoprolol succinate (TOPROL XL) 50 MG 24 hr tablet Take 1 tablet (50 mg total) by mouth daily. 90 tablet 0   metoprolol tartrate (LOPRESSOR) 25 MG tablet Take 1 tablet (25 mg total) by mouth every evening. 90 tablet 0   Misc Natural Products (JOINT SUPPORT PO) Take 1 Dose by mouth 3 (three) times a week.     spironolactone (ALDACTONE) 25 MG tablet TAKE 1 TABLET(25 MG) BY MOUTH DAILY 90 tablet 2   thiamine (VITAMIN B-1) 100 MG tablet Take 100 mg by mouth daily.     warfarin (COUMADIN) 6 MG tablet Take 1-2 tablets daily or as prescribed by Coumadin Clinic 30 tablet 2   No current facility-administered medications for this visit.    REVIEW OF SYSTEMS (negative unless checked):   Cardiac:  []  Chest pain or chest pressure? []  Shortness of breath upon activity? []  Shortness of breath when lying flat? []  Irregular heart rhythm?  Vascular:  []  Pain in calf, thigh, or hip brought on by walking? []  Pain in feet at night that wakes you up from your sleep? []  Blood clot in your veins? []  Leg swelling?  Pulmonary:  []  Oxygen at home? []   Productive cough? []  Wheezing?  Neurologic:  []  Sudden weakness in arms or legs? []  Sudden numbness in arms or legs? []  Sudden onset of difficult speaking or slurred speech? []  Temporary loss of vision in one eye? []  Problems with dizziness?  Gastrointestinal:  []  Blood in stool? []  Vomited blood?  Genitourinary:  []  Burning when urinating? []  Blood in urine?  Psychiatric:  []  Major depression  Hematologic:  []  Bleeding problems? []  Problems with blood clotting?  Dermatologic:  []  Rashes or ulcers?  Constitutional:  []  Fever or chills?  Ear/Nose/Throat:  []  Change in hearing? []  Nose bleeds? []  Sore throat?  Musculoskeletal:  []  Back  pain? []  Joint pain? []  Muscle pain?   Physical Examination   Vitals:   02/08/24 1357  BP: (!) 178/109  Pulse: 98  Resp: 18  Temp: 98 F (36.7 C)  TempSrc: Temporal  SpO2: 94%  Weight: 227 lb 4.8 oz (103.1 kg)  Height: 5\' 8"  (1.727 m)   Body mass index is 34.56 kg/m.  General:  WDWN in NAD; vital signs documented above Gait: Not observed HENT: WNL, normocephalic Pulmonary: normal non-labored breathing , without rales, rhonchi,  wheezing Cardiac: regular Abdomen: soft, NT, no masses Skin: without rashes Vascular Exam/Pulses: Brisk, triphasic right PT Doppler signal Extremities: Improved appearance of right Achilles wound without signs of infection Musculoskeletal: no muscle wasting or atrophy  Neurologic: A&O X 3; very conversant    Non-Invasive Vascular imaging   ABI (02/01/2024) R:  ABI: 1.02 (0.81),  PT: tri AT: mono TBI:  0.6 L:  ABI: 0.95 (0.93),  PT: mono DP: mono TBI: 0.54   RLE Arterial Duplex (02/01/2024) Patent arterial flow without hemodynamically significant stenosis  Medical Decision Making   [name] is a 73 y.o. male who presents for follow-up  The patient recently underwent right lower extremity angiogram with shockwave lithotripsy to the below-knee popliteal artery and posterior tibial artery angioplasty.  This was done for a slow to heal right Achilles wound ABIs demonstrate improved blood flow into the right foot.  Right ABI is 1.02 with triphasic flow into the PT Right lower extremity arterial duplex demonstrates patent flow in the leg without hemodynamically significant stenosis The patient reports that his right Achilles wound is making good progress after his angiogram.  His daughter continues to take care of it at home.  He is also being followed by the wound care center On exam his right Achilles wound does appear more superficial.  He has a brisk right PT Doppler signal Recommend that he continue going to the wound care  center.  He can follow-up with our office in 6 months with repeat right lower extremity arterial duplex and ABIs   Loel Dubonnet PA-C [facility] Office: [phone]  Clinic MD: Lenell Antu

## 2024-02-09 ENCOUNTER — Encounter (HOSPITAL_BASED_OUTPATIENT_CLINIC_OR_DEPARTMENT_OTHER): Attending: General Surgery | Admitting: General Surgery

## 2024-02-09 DIAGNOSIS — L97312 Non-pressure chronic ulcer of right ankle with fat layer exposed: Secondary | ICD-10-CM | POA: Diagnosis not present

## 2024-02-09 DIAGNOSIS — E11622 Type 2 diabetes mellitus with other skin ulcer: Secondary | ICD-10-CM | POA: Diagnosis not present

## 2024-02-09 DIAGNOSIS — I739 Peripheral vascular disease, unspecified: Secondary | ICD-10-CM | POA: Diagnosis not present

## 2024-02-09 DIAGNOSIS — E1151 Type 2 diabetes mellitus with diabetic peripheral angiopathy without gangrene: Secondary | ICD-10-CM | POA: Insufficient documentation

## 2024-02-10 ENCOUNTER — Other Ambulatory Visit: Payer: Self-pay

## 2024-02-10 DIAGNOSIS — I739 Peripheral vascular disease, unspecified: Secondary | ICD-10-CM

## 2024-02-18 ENCOUNTER — Other Ambulatory Visit (HOSPITAL_COMMUNITY): Payer: Self-pay

## 2024-03-14 ENCOUNTER — Ambulatory Visit

## 2024-03-27 ENCOUNTER — Other Ambulatory Visit: Payer: Self-pay | Admitting: Family

## 2024-03-31 ENCOUNTER — Other Ambulatory Visit: Payer: Self-pay | Admitting: Cardiology

## 2024-03-31 DIAGNOSIS — I4891 Unspecified atrial fibrillation: Secondary | ICD-10-CM

## 2024-04-07 ENCOUNTER — Telehealth: Payer: Self-pay | Admitting: Physician Assistant

## 2024-04-07 DIAGNOSIS — I5022 Chronic systolic (congestive) heart failure: Secondary | ICD-10-CM

## 2024-04-07 MED ORDER — EMPAGLIFLOZIN 10 MG PO TABS: 10.0000 mg | ORAL_TABLET | Freq: Every day | ORAL | 3 refills | Status: DC

## 2024-04-07 NOTE — Telephone Encounter (Signed)
 Refilled jardiance  at patient's daughter's request.

## 2024-04-20 ENCOUNTER — Telehealth: Payer: Self-pay

## 2024-04-20 ENCOUNTER — Other Ambulatory Visit (HOSPITAL_COMMUNITY): Payer: Self-pay | Admitting: Cardiology

## 2024-04-20 DIAGNOSIS — I5022 Chronic systolic (congestive) heart failure: Secondary | ICD-10-CM

## 2024-04-20 NOTE — Telephone Encounter (Signed)
 Copied from CRM [id number]. Topic: Clinical - Medication Question >> Apr 20, 2024  1:38 PM Shelby Dessert H wrote: Reason for CRM: Patients daughter called because she left her dads insulin  in the during the day for a couple of hours and she is wanting to know if she can still give it to him or not, please assist, callback number is [phone]. The patient has not had his insulin  in two days, he's okay and has no symptoms she is just wanting to know can he still get it.

## 2024-04-20 NOTE — Telephone Encounter (Signed)
 I spoke with patients daughter and informed her to consult with the pharmacist. The information online indicates that medication should be discarded if left in temperatures greater than 86 degrees. Patients daughter is aware that she may have to pay out of pocket if insurance will not cover sooner than its due.

## 2024-04-28 ENCOUNTER — Telehealth (HOSPITAL_COMMUNITY): Payer: Self-pay | Admitting: Internal Medicine

## 2024-04-28 NOTE — Telephone Encounter (Signed)
 Called to confirm/remind patient of their appointment at the Advanced [facility] Failure Clinic on 04/28/2024.   Appointment:   [] Confirmed  [x] Left mess   [] No answer/No voice mail  [] VM Full/unable to leave message  [] Phone not in service  Patient reminded to bring all medications and/or complete list.  Confirmed patient has transportation. Gave directions, instructed to utilize valet parking.

## 2024-04-30 NOTE — Progress Notes (Incomplete)
 Patient ID: [name], male   DOB: 04/30/1951, 73 y.o.   MRN: [id number]    Advanced Heart Failure Clinic Note   PCP: [facility] Primary Cardiologist: Dr. Burnard HF Cardiologist: Dr. Cherrie  Reason for visit: Heart failure  HPI: [name] is a 73 y.o. male with PAF, severe depression, DM2, CKD stage III, obesity, OSA and chronic systolic HF.  Diagnosed with AF in 2015  Admitted 03/2016 with  A fib RVR and volume overload in the setting of medication noncompliance. Refused cath, Myoview 12/2015. EF 20% with defects noted on rest and stress images within the inferior wall and to lesser extent the septum suggest ischemic etiology. No reversibility. Psychiatry met with him and he was deemed to have the ability to make decisions.  Seen in the HF clinic in 2021. EF had normalized.    Admitted 12/22 with ADHF. Echo EF 45-50%. Diuresed 19 liters.Hospital course c/b AKI and A F Underwent TEE-DC-CV with conversion to NSR. He was continued on amiodarone  200 mg daily. He was back in AF on discharge.  Discharge weight 203 pounds.   Admitted 2//23 after mechanical fall resulting in rib fractures and PTX.  AC stopped due to risk of fall. Discharged 12/15/21.   In 8/23 had critical limb ischemia of RLE with non-healing ulcer -> underwent right posterior tibial angioplasty by Dr. Gretta Mary he has been in AFL since at least 1/24. Was seen in Gen cardiology clinic and offered DCCV but he refused.  At return f/u was still in AF with RVR. Refused DC-CV or dig. Toprol  increased. Refused repeat echo or adding any meds.   Follow up 5/24, NYHA II and volume stable. Smoking 1/2 ppd. LE wounds healed. Recommended he get repeat echo.  2/25 underwent repeat LE angiogram by Dr. Gretta for critical limb ischemia and tissue loss (rt achilles wound). Posterior tibial stenosis including ostium was treated with percutaneous intervention with widely patent vessel at completion.  The  below-knee popliteal artery was then treated with shockwave lithotripsy w/ widely patent vessel at completion.  Preserved runoff into the foot.  Pt now presents to clinic as a work in given complaints of LEE. Pt's daughter called on 3/11 w/ complaints of LEE. Taking Lasix  20 mg daily. He denies resting dyspnea but has some abdominal distention. He is wearing light compression wraps. His daughter reports his edema has improved since wearing his wraps. BP today is 140/96 but has not yet taken AM meds.     Cardiac Testing  - TEE (12/22): EF 35% No PFO . No thrombus.  - DC-CV (12/22)--> NSR  - Echo (12/22): EF 45-50% - Echo (2021): EF 55-60 - Echo (5/17): EF 15-20%  - Myoview 2/17 Perfusion: Next defects noted on rest and stress images within the inferior wall and to lesser extent the septum. No reversible defects. Wall Motion: Severe generalized hypokinesia. Left ventricle is dilute Left Ventricular Ejection Fraction: 20 % End diastolic volume 218 ml End systolic volume 173 ml  1. 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 No evidence of ischemia. 2. Severe generalized hypokinesia with dilated left ventricle. 3. Left ventricular ejection fraction 20% 4. High-risk stress test findings*.  ROS: All systems negative except as listed in HPI, PMH and Problem List.  SH:  Social History   Socioeconomic History   Marital status: Divorced    Spouse name: Not on file   Number of children: Not on file  Years of education: Not on file   Highest education level: Not on file  Occupational History   Occupation: retired  Tobacco Use   Smoking status: Some Days    Current packs/day: 0.00    Types: Cigarettes, Cigars    Start date: 05/1969    Last attempt to quit: 05/2021    Years since quitting: 2.9    Passive exposure: Never   Smokeless tobacco: Never  Vaping Use   Vaping status: Never Used  Substance and Sexual Activity   Alcohol use: Not Currently   Drug  use: Never   Sexual activity: Not on file  Other Topics Concern   Not on file  Social History Narrative   ** Merged History Encounter ** Lives in [place] by himself. Retired from [employer] for [employer].       Tobacco use, amount per day now:   Past tobacco use, amount per day:   How many years did you use tobacco:   Alcohol use (drinks per week): N/A   Diet:   Do you drink/eat things with caffeine:   Marital status:  Divorced                                What year were you married? 2003   Do you live in a house, apartment, assisted living, condo, trailer, etc.? House   Is it one or more stories? One   How many persons live in your home?   Do you have pets in your home?( please list) N/A   Highest Level of education completed? Bachelors Degree   Current or past profession: Teacher-Special Ed   Do you exercise?  Yes                                Type and how often? Daily squats, and push ups.   Do you have a living will? No   Do you have a DNR form?  No                                 If not, do you want to discuss one?   Do you have signed POA/HPOA forms?  No                      If so, please bring to you appointment      Do you have any difficulty bathing or dressing yourself? Bathing only if pants not shirts/not shorts.   Do you have any difficulty preparing food or eating? No   Do you have any difficulty managing your medications? No   Do you have any difficulty managing your finances? No   Do you have any difficulty affording your medications? No   Social Drivers of Corporate investment banker Strain: Not on file  Food Insecurity: No Food Insecurity (11/10/2021)   Hunger Vital Sign    Worried About Running Out of Food in the Last Year: Never true    Ran Out of Food in the Last Year: Never true  Transportation Needs: No Transportation Needs (11/10/2021)   PRAPARE - Administrator, Civil Service (Medical): No    Lack of Transportation  (Non-Medical): No  Physical Activity: Not on file  Stress: Not on file  Social  Connections: Not on file  Intimate Partner Violence: Not on file   FH:  Family History  Problem Relation Age of Onset   Heart attack Mother        deceased   Diabetes Mother    Heart attack Sister    Past Medical History:  Diagnosis Date   A-fib [facility])    a. (06/04/14) TEE-DC-CV; succesful; large LA 6.2 cm   ADHD    Ascites    Athlete's foot    Chronic systolic heart failure (HCC)    a. 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, unspecified CKD stage, unspecified whether long term insulin  use (HCC)    Heart murmur    HTN (hypertension)    OSA (obstructive sleep apnea)    PVD (peripheral vascular disease) (HCC)    s/p great toe amputation   Current Outpatient Medications  Medication Sig Dispense Refill   Accu-Chek Softclix Lancets lancets 1 each by Other route daily. As instructed E11.22 100 each 11   Blood Glucose Monitoring Suppl (ACCU-CHEK AVIVA PLUS) w/Device KIT 1 Device by Does not apply route daily. E11.22 1 kit 11   clopidogrel  (PLAVIX ) 75 MG tablet Take 1 tablet (75 mg total) by mouth daily. 30 tablet 11   diclofenac  Sodium (VOLTAREN ) 1 % GEL Apply 1 Application topically 4 (four) times daily as needed (pain).     empagliflozin  (JARDIANCE ) 10 MG TABS tablet Take 1 tablet (10 mg total) by mouth daily. 90 tablet 3   furosemide  (LASIX ) 20 MG tablet Take 2 tablets (40 mg total) by mouth daily. One Tablet by mouth in the morning. 180 tablet 3   glucose blood (ACCU-CHEK AVIVA PLUS) test strip 1 each by Other route daily. As instructed E11.22 100 each 11   Insulin  Syringe-Needle U-100 (TRUEPLUS INSULIN  SYRINGE) 30G X 5/16 1 ML MISC USE ONCE DAILY FOR INSULIN  INJECTIONS. 100 each 2   LANTUS  100  UNIT/ML injection INJECT 8 UNITS UNDER THE SKIN ONCE DAILY AS DIRECTED 10 mL 11   losartan  (COZAAR ) 25 MG tablet Take 25 mg by mouth daily.     metoprolol  succinate (TOPROL -XL) 50 MG 24 hr tablet TAKE 1 TABLET(50 MG) BY MOUTH DAILY 90 tablet 0   metoprolol  tartrate (LOPRESSOR ) 25 MG tablet Take 1 tablet (25 mg total) by mouth every evening. 90 tablet 0   Misc Natural Products (JOINT SUPPORT PO) Take 1 Dose by mouth 3 (three) times a week.     spironolactone  (ALDACTONE ) 25 MG tablet TAKE 1 TABLET(25 MG) BY MOUTH DAILY 90 tablet 2   thiamine (VITAMIN B-1) 100 MG tablet Take 100 mg by mouth daily.     warfarin (COUMADIN ) 6 MG tablet Take 1-2 tablets daily or as prescribed by Coumadin  Clinic 30 tablet 2   No current facility-administered medications for this visit.   There were no vitals taken for this visit.  Wt Readings from Last 3 Encounters:  02/08/24 103.1 kg (227 lb 4.8 oz)  01/24/24 104.3 kg (230 lb)  01/06/24 98.9 kg (218 lb)   PHYSICAL EXAM: General:  Well appearing. No respiratory difficulty HEENT: normal Neck: supple. no JVD. Carotids 2+ bilat; no bruits. No lymphadenopathy or thyromegaly appreciated. Cor: PMI nondisplaced. Regular rate & rhythm. No rubs, gallops or murmurs. Lungs: clear Abdomen: soft, nontender, +  distended. No hepatosplenomegaly. No bruits or masses. Good bowel sounds. Extremities: no cyanosis, clubbing, rash, trace b/l LE edema Neuro: alert & oriented x 3, cranial nerves grossly intact. moves all 4 extremities w/o difficulty. Affect pleasant.  ECG: not performed   ASSESSMENT & PLAN:  1. Chronic HFmEF - Admitted 12/22 with marked volume overload. Not taking medications as prescribed. - Myoview 12/2015. EF 20% with defects noted on rest and stress images within the inferior wall and to lesser extent the septum suggest ischemic etiology. No reversibility. Refused cath multiple times. No s/s angina currently.  - Echo (10/21): EF 55-60%. - Echo (2/24): EF  45-50%. RV normal  - Echo 5/24: EF 45-50% - NYHA II-IIb, confounded by physical deconditioning.  - Continue lasix  40 daily - Continue losartan  25 mg daily  - Continue Toprol  XL 50 mg daily  - Continue spiro 25 mg daily  - Continue Jardiance  10 mg daily. - Check BMP and BNP today   2. Chronic AF/ AFL - rate controlled w/ metoprolol , continue  - Continue warfarin. Followed by Coumadin  Clinic.   3. CKD Stage IIIa-IIIb - Baseline SCr 1.5-1.7  - on SGLT2i, Jardiance  - check BMP today   4. DMII - followed by PCP - on insulin , SGLT2i   5. HTN - mildly elevated but prior to taking AM meds today - continue current regimen, no change   6.  H/o LV Thrombus - Resolved - On warfarin for AF/AFL  7. PAD/ Chronic LE Wounds  - Followed by vascular and podiatry for right heel wound - s/p percutaneous intervention by Dr. Gretta as outlined above 2/25   Toribio Fuel MD 5:23 PM

## 2024-05-01 ENCOUNTER — Encounter (HOSPITAL_COMMUNITY): Admitting: Internal Medicine

## 2024-05-21 ENCOUNTER — Other Ambulatory Visit: Payer: Self-pay | Admitting: Vascular Surgery

## 2024-06-28 ENCOUNTER — Other Ambulatory Visit (HOSPITAL_COMMUNITY): Payer: Self-pay | Admitting: Cardiology

## 2024-07-13 ENCOUNTER — Other Ambulatory Visit: Payer: Self-pay | Admitting: Adult Health

## 2024-07-13 DIAGNOSIS — I4891 Unspecified atrial fibrillation: Secondary | ICD-10-CM

## 2024-07-13 NOTE — Telephone Encounter (Signed)
 Pharmacy Requested refill.  Rx Expired and Filled by Cardiologist.  Pended Rx and sent to [facility] for Approval/Denial.

## 2024-07-14 NOTE — Telephone Encounter (Signed)
 Patient needs appointment.

## 2024-07-19 ENCOUNTER — Other Ambulatory Visit: Payer: Self-pay | Admitting: Family

## 2024-07-19 DIAGNOSIS — I5022 Chronic systolic (congestive) heart failure: Secondary | ICD-10-CM

## 2024-07-19 DIAGNOSIS — I4891 Unspecified atrial fibrillation: Secondary | ICD-10-CM

## 2024-07-19 NOTE — Telephone Encounter (Signed)
 Copied from CRM [id number]. Topic: Clinical - Medication Refill >> Jul 19, 2024 10:39 AM Keoka S wrote: Medication: metoprolol  succinate (TOPROL -XL) 50 MG 24 hr tablet  metoprolol  tartrate (LOPRESSOR ) 25 MG tablet  Has the patient contacted their pharmacy? Yes (Agent: If no, request that the patient contact the pharmacy for the refill. If patient does not wish to contact the pharmacy document the reason why and proceed with request.) (Agent: If yes, when and what did the pharmacy advise?)  This is the patient's preferred pharmacy:  [id number] - [place], [place] - [address] AT [facility] OF [address] [place] [address] Phone: [phone] Fax: [phone]  Is this the correct pharmacy for this prescription? Yes If no, delete pharmacy and type the correct one.   Has the prescription been filled recently? No  Is the patient out of the medication? Yes  Has the patient been seen for an appointment in the last year OR does the patient have an upcoming appointment? Yes  Can we respond through MyChart? Yes  Agent: Please be advised that Rx refills may take up to 3 business days. We ask that you follow-up with your pharmacy.

## 2024-07-20 NOTE — Telephone Encounter (Signed)
 Medication managed by Cardiologist.

## 2024-07-20 NOTE — Telephone Encounter (Signed)
 Medications has not been fufilled by provider

## 2024-07-21 ENCOUNTER — Encounter: Admitting: Family

## 2024-07-21 NOTE — Progress Notes (Signed)
   This encounter was created in error - please disregard. No show

## 2024-07-22 ENCOUNTER — Other Ambulatory Visit: Payer: Self-pay | Admitting: Family

## 2024-07-22 ENCOUNTER — Telehealth: Payer: Self-pay | Admitting: Physician Assistant

## 2024-07-22 DIAGNOSIS — I4891 Unspecified atrial fibrillation: Secondary | ICD-10-CM

## 2024-07-22 DIAGNOSIS — E1122 Type 2 diabetes mellitus with diabetic chronic kidney disease: Secondary | ICD-10-CM

## 2024-07-22 MED ORDER — METOPROLOL SUCCINATE ER 50 MG PO TB24: 50.0000 mg | ORAL_TABLET | Freq: Every day | ORAL | 0 refills | Status: DC

## 2024-07-22 NOTE — Telephone Encounter (Signed)
   Returned call per pager request.  Spoke with daughter, Wandalee, who cares for patient since patient is blind.  Reported being out of Toprol  XL 50 mg for 2 to 3 days. She attempted to get PCP to refill but denied request since previously filled by cardiology.  Daughter has not been able to take BP or HR since patient has been noncooperative and refuses vitals.  Patient has not taken Lopressor  25 mg for several months. Also noted that patient has refused to take losartan  for 5 months.  Patient already scheduled for follow-up appointment 07/25/2024 with plans to attend.  Strongly recommended following up as scheduled.  Will order Toprol  XL 50 mg #30 with no refills.  Recommended to obtain vital signs if possible.      Signed, Lorette CINDERELLA Kapur, PA-C 07/22/2024, 2:24 PM Pager: [phone]

## 2024-07-24 NOTE — Telephone Encounter (Signed)
 Patient hasn't been seen within a year. Contacted patient to schedule an appointment with Ngetich, Dinah C, NP. Routing medication to pcp for further instructions regarding medication.

## 2024-07-24 NOTE — Progress Notes (Signed)
 Patient ID: [name], male   DOB: January 07, 1951, 73 y.o.   MRN: [id number]    Advanced Heart Failure Clinic Note   PCP: [facility] Primary Cardiologist: Dr. Burnard HF Cardiologist: Dr. Cherrie  Reason for visit: b/l LEE   HPI: [name] is a 73 y.o. male with PAF, severe depression, DM2, CKD stage III, obesity, OSA and chronic systolic HF.  Admitted 6/15 with dyspena and LE edema. Found to be in afib with RVR.  Admitted 12/2015 wit volume overload/Afib RVR  in the setting of medication compliance. Had TEE but DC-CV was cancelled due to LA thrombus.   Admitted 03/2016 with  A fib RVR and volume overload in the setting of medication noncompliance. Refused cath, Myoview 12/2015. EF 20% with defects noted on rest and stress images within the inferior wall and to lesser extent the septum suggest ischemic etiology. No reversibility. Psychiatry met with him and he was deemed to have the ability to make decisions.   Admitted 12/22 with ADHF. Echo EF 45-50%. Diuresed 19 liters. Transitioned to lasix  20 mg daily. Hospital course c/b AKI and A F Underwent TEE-DC-CV with conversion to NSR. He was continued on amiodarone  200 mg daily. He was back in AF on discharge.   Admitted 2//23 after mechanical fall resulting in rib fractures and PTX.  AC stopped due to risk of fall. Discharged 12/15/21.   In 8/23 had critical limb ischemia of RLE with non-healing ulcer -> underwent right posterior tibial angioplasty by Dr. Gretta   Refused repeat DC-CV for AF multiple times. -> chronic AF  Follow up 5/24, NYHA II and volume stable. ECHO EF 45-50% Smoking 1/2 ppd. LE wounds healed.  2/25 underwent repeat LE angiogram by Dr. Gretta for critical limb ischemia and tissue loss (rt achilles wound). Angiogram showed the right common femoral, profunda and SFA were widely patent.  He had a high-grade 90% below-knee popliteal focal stenosis.  Dominant runoff in the right lower extremity is through  the posterior tibial and proximally there was a high-grade 99% stenosis at the ostium with a diffusely diseased vessel proximally.  The peroneal was patent but then occluded in the distal calf.  The anterior tibial was occluded. Ultimately the posterior tibial stenosis including ostium was treated with percutaneous intervention with widely patent vessel at completion.  The below-knee popliteal artery was then treated with shockwave lithotripsy w/ widely patent vessel at completion.  Preserved runoff into the foot.  Pt now presents to clinic as a work in given complaints of LEE. Pt's daughter called on 3/11 w/ complaints of LEE. Taking Lasix  20 mg daily. He denies resting dyspnea but has some abdominal distention. He is wearing light compression wraps. His daughter reports his edema has improved since wearing his wraps. BP today is 140/96 but has not yet taken AM meds.     Cardiac Testing  - TEE (12/22): EF 35% No PFO . No thrombus.  - DC-CV (12/22)--> NSR  - Echo (12/22): EF 45-50% - Echo (2021): EF 55-60 - Echo (5/17): EF 15-20%  - Myoview 2/17 Perfusion: Next defects noted on rest and stress images within the inferior wall and to lesser extent the septum. No reversible defects. Wall Motion: Severe generalized hypokinesia. Left ventricle is dilute Left Ventricular Ejection Fraction: 20 % End diastolic volume 218 ml End systolic volume 173 ml  1. 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 No evidence of ischemia. 2. Severe generalized hypokinesia with dilated  left ventricle. 3. Left ventricular ejection fraction 20% 4. High-risk stress test findings*.  ROS: All systems negative except as listed in HPI, PMH and Problem List.  SH:  Social History   Socioeconomic History   Marital status: Divorced    Spouse name: Not on file   Number of children: Not on file   Years of education: Not on file   Highest education level: Not on file  Occupational History    Occupation: retired  Tobacco Use   Smoking status: Some Days    Current packs/day: 0.00    Types: Cigarettes, Cigars    Start date: 05/1969    Last attempt to quit: 05/2021    Years since quitting: 3.2    Passive exposure: Never   Smokeless tobacco: Never  Vaping Use   Vaping status: Never Used  Substance and Sexual Activity   Alcohol use: Not Currently   Drug use: Never   Sexual activity: Not on file  Other Topics Concern   Not on file  Social History Narrative   ** Merged History Encounter ** Lives in [place] by himself. Retired from [employer] for [employer].       Tobacco use, amount per day now:   Past tobacco use, amount per day:   How many years did you use tobacco:   Alcohol use (drinks per week): N/A   Diet:   Do you drink/eat things with caffeine:   Marital status:  Divorced                                What year were you married? 2003   Do you live in a house, apartment, assisted living, condo, trailer, etc.? House   Is it one or more stories? One   How many persons live in your home?   Do you have pets in your home?( please list) N/A   Highest Level of education completed? Bachelors Degree   Current or past profession: Teacher-Special Ed   Do you exercise?  Yes                                Type and how often? Daily squats, and push ups.   Do you have a living will? No   Do you have a DNR form?  No                                 If not, do you want to discuss one?   Do you have signed POA/HPOA forms?  No                      If so, please bring to you appointment      Do you have any difficulty bathing or dressing yourself? Bathing only if pants not shirts/not shorts.   Do you have any difficulty preparing food or eating? No   Do you have any difficulty managing your medications? No   Do you have any difficulty managing your finances? No   Do you have any difficulty affording your medications? No   Social Drivers of Manufacturing engineer Strain: Not on file  Food Insecurity: No Food Insecurity (11/10/2021)   Hunger Vital Sign    Worried About Running Out of Food in  the Last Year: Never true    Ran Out of Food in the Last Year: Never true  Transportation Needs: No Transportation Needs (11/10/2021)   PRAPARE - Administrator, Civil Service (Medical): No    Lack of Transportation (Non-Medical): No  Physical Activity: Not on file  Stress: Not on file  Social Connections: Not on file  Intimate Partner Violence: Not on file   FH:  Family History  Problem Relation Age of Onset   Heart attack Mother        deceased   Diabetes Mother    Heart attack Sister    Past Medical History:  Diagnosis Date   A-fib [facility])    a. (06/04/14) TEE-DC-CV; succesful; large LA 6.2 cm   ADHD    Ascites    Athlete's foot    Chronic systolic heart failure (HCC)    a. 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, unspecified CKD stage, unspecified whether long term insulin  use (HCC)    Heart murmur    HTN (hypertension)    OSA (obstructive sleep apnea)    PVD (peripheral vascular disease) (HCC)    s/p great toe amputation   Current Outpatient Medications  Medication Sig Dispense Refill   Accu-Chek Softclix Lancets lancets 1 each by Other route daily. As instructed E11.22 100 each 11   Blood Glucose Monitoring Suppl (ACCU-CHEK AVIVA PLUS) w/Device KIT 1 Device by Does not apply route daily. E11.22 1 kit 11   clopidogrel  (PLAVIX ) 75 MG tablet TAKE 1 TABLET(75 MG) BY MOUTH DAILY 30 tablet 11   diclofenac  Sodium (VOLTAREN ) 1 % GEL Apply 1 Application topically 4 (four) times daily as needed (pain).     empagliflozin  (JARDIANCE ) 10 MG TABS tablet Take 1 tablet (10 mg total) by mouth daily. 90 tablet 3   furosemide   (LASIX ) 20 MG tablet Take 2 tablets (40 mg total) by mouth daily. One Tablet by mouth in the morning. 180 tablet 3   glucose blood (ACCU-CHEK AVIVA PLUS) test strip 1 each by Other route daily. As instructed E11.22 100 each 11   insulin  glargine (LANTUS ) 100 UNIT/ML injection INJECT 8 UNITS UNDER THE SKIN ONCE DAILY AS DIRECTED 10 mL 0   Insulin  Syringe-Needle U-100 (TRUEPLUS INSULIN  SYRINGE) 30G X 5/16 1 ML MISC USE ONCE DAILY FOR INSULIN  INJECTIONS. 100 each 2   losartan  (COZAAR ) 25 MG tablet Take 25 mg by mouth daily.     metoprolol  succinate (TOPROL  XL) 50 MG 24 hr tablet Take 1 tablet (50 mg total) by mouth daily. Take with or immediately following a meal. 30 tablet 0   metoprolol  succinate (TOPROL -XL) 50 MG 24 hr tablet TAKE 1 TABLET(50 MG) BY MOUTH DAILY 90 tablet 0   metoprolol  tartrate (LOPRESSOR ) 25 MG tablet Take 1 tablet (25 mg total) by mouth every evening. 90 tablet 0   Misc Natural Products (JOINT SUPPORT PO) Take 1 Dose by mouth 3 (three) times a week.     spironolactone  (ALDACTONE ) 25 MG tablet TAKE 1 TABLET(25 MG) BY MOUTH DAILY 90 tablet 2   thiamine (VITAMIN B-1) 100 MG tablet Take 100 mg by mouth daily.     warfarin (COUMADIN ) 6 MG tablet Take 1-2 tablets daily or as prescribed by Coumadin  Clinic 30 tablet 2  No current facility-administered medications for this encounter.   There were no vitals taken for this visit.  Wt Readings from Last 3 Encounters:  02/08/24 103.1 kg (227 lb 4.8 oz)  01/24/24 104.3 kg (230 lb)  01/06/24 98.9 kg (218 lb)   PHYSICAL EXAM: General:  Well appearing. No respiratory difficulty HEENT: normal Neck: supple. no JVD. Carotids 2+ bilat; no bruits. No lymphadenopathy or thyromegaly appreciated. Cor: PMI nondisplaced. Regular rate & rhythm. No rubs, gallops or murmurs. Lungs: clear Abdomen: soft, nontender, +distended. No hepatosplenomegaly. No bruits or masses. Good bowel sounds. Extremities: no cyanosis, clubbing, rash, trace b/l LE  edema Neuro: alert & oriented x 3, cranial nerves grossly intact. moves all 4 extremities w/o difficulty. Affect pleasant.  ECG: not performed   ASSESSMENT & PLAN:  1. Chronic HFmEF - Admitted 12/22 with marked volume overload. Not taking medications as prescribed. - Myoview 12/2015. EF 20% with defects noted on rest and stress images within the inferior wall and to lesser extent the septum suggest ischemic etiology. No reversibility. Refused cath multiple times. No s/s angina currently.  - Echo (10/21): EF 55-60%. - Echo (12/22): EF 45-50%, RV moderately reduced, RVSP 85 mmHg - Echo (5/24): EF 45-50%. RV normal  - NYHA II-IIb, confounded by physical deconditioning. Mildly volume overloaded w/ leg edema - Increase Lasix  to 40 mg daily, encouraged leg elevation w/ standing  - Continue losartan  25 mg daily  - Continue Toprol  XL 50 mg daily  - Continue spiro 25 mg daily  - Continue Jardiance  10 mg daily. - Check BMP and BNP today   2. Chronic AF/ AFL - rate controlled w/ metoprolol , continue  - Continue warfarin. Followed by Coumadin  Clinic.   3. CKD Stage IIIa-IIIb - Baseline SCr 1.5-1.7  - on SGLT2i, Jardiance  - check BMP today   4. DMII - followed by PCP - on insulin , SGLT2i   5. HTN - mildly elevated but prior to taking AM meds today - continue current regimen, no change   6.  H/o LV Thrombus - Resolved - On warfarin for AF/AFL  7. PAD/ Chronic LE Wounds  - Followed by vascular and podiatry for right heel wound - s/p percutaneous intervention by Dr. Gretta as outlined above 2/25    Toribio Fuel MD 6:57 PM

## 2024-07-25 ENCOUNTER — Ambulatory Visit (HOSPITAL_COMMUNITY)
Admission: RE | Admit: 2024-07-25 | Discharge: 2024-07-25 | Disposition: A | Discharge status: Home / Self Care | Source: Ambulatory Visit | Attending: Internal Medicine | Admitting: Internal Medicine

## 2024-07-25 ENCOUNTER — Encounter (HOSPITAL_COMMUNITY): Payer: Self-pay | Admitting: Internal Medicine

## 2024-07-25 VITALS — BP 170/98 | HR 94 | Ht 68.0 in | Wt 232.2 lb

## 2024-07-25 DIAGNOSIS — N183 Chronic kidney disease, stage 3 unspecified: Secondary | ICD-10-CM

## 2024-07-25 DIAGNOSIS — Z7901 Long term (current) use of anticoagulants: Secondary | ICD-10-CM | POA: Diagnosis not present

## 2024-07-25 DIAGNOSIS — G4733 Obstructive sleep apnea (adult) (pediatric): Secondary | ICD-10-CM | POA: Insufficient documentation

## 2024-07-25 DIAGNOSIS — I130 Hypertensive heart and chronic kidney disease with heart failure and stage 1 through stage 4 chronic kidney disease, or unspecified chronic kidney disease: Secondary | ICD-10-CM | POA: Diagnosis not present

## 2024-07-25 DIAGNOSIS — E1151 Type 2 diabetes mellitus with diabetic peripheral angiopathy without gangrene: Secondary | ICD-10-CM | POA: Diagnosis not present

## 2024-07-25 DIAGNOSIS — L97419 Non-pressure chronic ulcer of right heel and midfoot with unspecified severity: Secondary | ICD-10-CM | POA: Insufficient documentation

## 2024-07-25 DIAGNOSIS — I4891 Unspecified atrial fibrillation: Secondary | ICD-10-CM

## 2024-07-25 DIAGNOSIS — I4519 Other right bundle-branch block: Secondary | ICD-10-CM | POA: Insufficient documentation

## 2024-07-25 DIAGNOSIS — E669 Obesity, unspecified: Secondary | ICD-10-CM | POA: Diagnosis not present

## 2024-07-25 DIAGNOSIS — Z794 Long term (current) use of insulin: Secondary | ICD-10-CM

## 2024-07-25 DIAGNOSIS — I5022 Chronic systolic (congestive) heart failure: Secondary | ICD-10-CM | POA: Diagnosis not present

## 2024-07-25 DIAGNOSIS — Z6835 Body mass index (BMI) 35.0-35.9, adult: Secondary | ICD-10-CM | POA: Insufficient documentation

## 2024-07-25 DIAGNOSIS — I482 Chronic atrial fibrillation, unspecified: Secondary | ICD-10-CM

## 2024-07-25 DIAGNOSIS — F329 Major depressive disorder, single episode, unspecified: Secondary | ICD-10-CM | POA: Insufficient documentation

## 2024-07-25 DIAGNOSIS — Z7984 Long term (current) use of oral hypoglycemic drugs: Secondary | ICD-10-CM | POA: Diagnosis not present

## 2024-07-25 DIAGNOSIS — Z79899 Other long term (current) drug therapy: Secondary | ICD-10-CM | POA: Insufficient documentation

## 2024-07-25 DIAGNOSIS — E11621 Type 2 diabetes mellitus with foot ulcer: Secondary | ICD-10-CM | POA: Diagnosis not present

## 2024-07-25 DIAGNOSIS — E1122 Type 2 diabetes mellitus with diabetic chronic kidney disease: Secondary | ICD-10-CM

## 2024-07-25 LAB — BASIC METABOLIC PANEL WITH GFR
Anion gap: 7 (ref 5–15)
BUN: 36 mg/dL — ABNORMAL HIGH (ref 8–23)
CO2: 27 mmol/L (ref 22–32)
Calcium: 9.6 mg/dL (ref 8.9–10.3)
Chloride: 104 mmol/L (ref 98–111)
Creatinine, Ser: 1.62 mg/dL — ABNORMAL HIGH (ref 0.61–1.24)
GFR, Estimated: 45 mL/min — ABNORMAL LOW (ref >60)
Glucose, Bld: 139 mg/dL — ABNORMAL HIGH (ref 70–99)
Potassium: 5 mmol/L (ref 3.5–5.1)
Sodium: 138 mmol/L (ref 135–145)

## 2024-07-25 LAB — BRAIN NATRIURETIC PEPTIDE: B Natriuretic Peptide: 566.5 pg/mL — ABNORMAL HIGH (ref 0.0–100.0)

## 2024-07-25 MED ORDER — FUROSEMIDE 40 MG PO TABS: 80.0000 mg | ORAL_TABLET | Freq: Every day | ORAL | 6 refills | Status: DC

## 2024-07-25 MED ORDER — METOLAZONE 2.5 MG PO TABS: 2.5000 mg | ORAL_TABLET | Freq: Once | ORAL | 0 refills | Status: DC

## 2024-07-25 MED ORDER — POTASSIUM CHLORIDE CRYS ER 20 MEQ PO TBCR: 40.0000 meq | EXTENDED_RELEASE_TABLET | Freq: Once | ORAL | 0 refills | Status: DC

## 2024-07-25 NOTE — Patient Instructions (Signed)
 Medication Changes:  INCREASE Furosemide  to 80 mg Daily  Take Metolazone  2.5 mg TODAY along with 40 meq (2 tabs) of Potassium  Lab Work:  Labs done today, your results will be available in MyChart, we will contact you for abnormal readings.  Special Instructions // Education:  Do the following things EVERYDAY: Weigh yourself in the morning before breakfast. Write it down and keep it in a log. Take your medicines as prescribed Eat low salt foods--Limit salt (sodium) to 2000 mg per day.  Stay as active as you can everyday Limit all fluids for the day to less than 2 liters   Follow-Up in: 2-3 weeks   At the Advanced [facility] Failure Clinic, you and your health needs are our priority. We have a designated team specialized in the treatment of Heart Failure. This Care Team includes your primary Heart Failure Specialized Cardiologist (physician), Advanced Practice Providers (APPs- Physician Assistants and Nurse Practitioners), and Pharmacist who all work together to provide you with the care you need, when you need it.   You may see any of the following providers on your designated Care Team at your next follow up:  Dr. Toribio Fuel Dr. Ezra Shuck Dr. Ria Commander Dr. Odis Brownie Greig Mosses, NP [place]affie Shed, [place] [facility] [place], [place] Beckey Coe, NP [place] Lee, NP Tinnie Redman, PharmD   Please be sure to bring in all your medications bottles to every appointment.   Need to Contact Us :  If you have any questions or concerns before your next appointment please send us  a message through [place] or call our office at [phone].    TO LEAVE A MESSAGE FOR THE NURSE SELECT OPTION 2, PLEASE LEAVE A MESSAGE INCLUDING: YOUR NAME DATE OF BIRTH CALL BACK NUMBER REASON FOR CALL**this is important as we prioritize the call backs  YOU WILL RECEIVE A CALL BACK THE SAME DAY AS LONG AS YOU CALL BEFORE 4:00 PM

## 2024-07-25 NOTE — Progress Notes (Signed)
 Called pt's daughter, Wandalee at [phone], to discuss treatment plan, ok per patient to do so. She is aware of med changes and f/u appt.

## 2024-08-04 NOTE — Progress Notes (Addendum)
 ATILLA ZOLLNER                                          MRN: [id number]   08/04/2024   The [facility] Quality Team Specialist reviewed this patient medical record for the purposes of chart review for care gap closure. The following were reviewed: chart review for care gap closure-glycemic status assessment.AND KIDNEY HEALTH EVALUATION FOR DM PATIENTS: EGFR AND UACR LABS.    [facility] Quality Team

## 2024-08-04 NOTE — Progress Notes (Incomplete)
 Patient ID: [name], male   DOB: [birth year], 73 y.o.   MRN: [id number]    Advanced Heart Failure Clinic Note   PCP: [facility] Primary Cardiologist: Dr. Burnard HF Cardiologist: Dr. Cherrie  Reason for visit: b/l LEE   HPI: [name] is a 73 y.o. male with PAF, severe depression, DM2, CKD stage III, obesity, OSA and chronic systolic HF.  Admitted 6/15 with dyspena and LE edema. Found to be in afib with RVR.  Admitted 12/2015 wit volume overload/Afib RVR  in the setting of medication compliance. Had TEE but DC-CV was cancelled due to LA thrombus.   Admitted 03/2016 with  A fib RVR and volume overload in the setting of medication noncompliance. Refused cath, Myoview 12/2015. EF 20% with defects noted on rest and stress images within the inferior wall and to lesser extent the septum suggest ischemic etiology. No reversibility. Psychiatry met with him and he was deemed to have the ability to make decisions.   Admitted 12/22 with ADHF. Echo EF 45-50%. Diuresed 19 liters. Transitioned to lasix  20 mg daily. Hospital course c/b AKI and A F Underwent TEE-DC-CV with conversion to NSR. He was continued on amiodarone  200 mg daily. He was back in AF on discharge.   Admitted 2//23 after mechanical fall resulting in rib fractures and PTX.  AC stopped due to risk of fall. Discharged 12/15/21.   In 8/23 had critical limb ischemia of RLE with non-healing ulcer -> underwent right posterior tibial angioplasty by Dr. Gretta   Refused repeat DC-CV for AF multiple times. -> chronic AF  Follow up 5/24, NYHA II and volume stable. ECHO EF 45-50% Smoking 1/2 ppd. LE wounds healed.  2/25 underwent repeat LE angiogram by Dr. Gretta for critical limb ischemia and tissue loss (rt achilles wound). Angiogram showed the right common femoral, profunda and SFA were widely patent.  He had a high-grade 90% below-knee popliteal focal stenosis.  Dominant runoff in the right lower extremity is through  the posterior tibial and proximally there was a high-grade 99% stenosis at the ostium with a diffusely diseased vessel proximally.  The peroneal was patent but then occluded in the distal calf.  The anterior tibial was occluded. Ultimately the posterior tibial stenosis including ostium was treated with percutaneous intervention with widely patent vessel at completion.  The below-knee popliteal artery was then treated with shockwave lithotripsy w/ widely patent vessel at completion.  Preserved runoff into the foot.  Here for routine f/u. Says he feels great. Says LE wounds have completely healed. No CP or SOB. Not doing much but can do ADLs without problem. No CP. He notes recent increase in LE edema/weight gain . Confused about his meds. Has not taken metoprolol  x 3 days    Cardiac Testing  - TEE (12/22): EF 35% No PFO . No thrombus.  - DC-CV (12/22)--> NSR  - Echo (12/22): EF 45-50% - Echo (2021): EF 55-60 - Echo (5/17): EF 15-20%  - Myoview 2/17 Perfusion: Next defects noted on rest and stress images within the inferior wall and to lesser extent the septum. No reversible defects. Wall Motion: Severe generalized hypokinesia. Left ventricle is dilute Left Ventricular Ejection Fraction: 20 % End diastolic volume 218 ml End systolic volume 173 ml  1. 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 No evidence of ischemia. 2. Severe generalized hypokinesia with dilated left ventricle. 3. Left ventricular ejection fraction 20% 4. High-risk stress test findings*.  ROS: All  systems negative except as listed in HPI, PMH and Problem List.  SH:  Social History   Socioeconomic History   Marital status: Divorced    Spouse name: Not on file   Number of children: Not on file   Years of education: Not on file   Highest education level: Not on file  Occupational History   Occupation: retired  Tobacco Use   Smoking status: Some Days    Current packs/day: 0.00     Types: Cigarettes, Cigars    Start date: 05/1969    Last attempt to quit: 05/2021    Years since quitting: 3.2    Passive exposure: Never   Smokeless tobacco: Never  Vaping Use   Vaping status: Never Used  Substance and Sexual Activity   Alcohol use: Not Currently   Drug use: Never   Sexual activity: Not on file  Other Topics Concern   Not on file  Social History Narrative   ** Merged History Encounter ** Lives in [place] by himself. Retired from [employer] for [employer].       Tobacco use, amount per day now:   Past tobacco use, amount per day:   How many years did you use tobacco:   Alcohol use (drinks per week): N/A   Diet:   Do you drink/eat things with caffeine:   Marital status:  Divorced                                What year were you married? 2003   Do you live in a house, apartment, assisted living, condo, trailer, etc.? House   Is it one or more stories? One   How many persons live in your home?   Do you have pets in your home?( please list) N/A   Highest Level of education completed? Bachelors Degree   Current or past profession: Teacher-Special Ed   Do you exercise?  Yes                                Type and how often? Daily squats, and push ups.   Do you have a living will? No   Do you have a DNR form?  No                                 If not, do you want to discuss one?   Do you have signed POA/HPOA forms?  No                      If so, please bring to you appointment      Do you have any difficulty bathing or dressing yourself? Bathing only if pants not shirts/not shorts.   Do you have any difficulty preparing food or eating? No   Do you have any difficulty managing your medications? No   Do you have any difficulty managing your finances? No   Do you have any difficulty affording your medications? No   Social Drivers of Corporate investment banker Strain: Not on file  Food Insecurity: No Food Insecurity (11/10/2021)   Hunger Vital  Sign    Worried About Running Out of Food in the Last Year: Never true    Ran Out of Food in the Last Year:  Never true  Transportation Needs: No Transportation Needs (11/10/2021)   PRAPARE - Administrator, Civil Service (Medical): No    Lack of Transportation (Non-Medical): No  Physical Activity: Not on file  Stress: Not on file  Social Connections: Not on file  Intimate Partner Violence: Not on file   FH:  Family History  Problem Relation Age of Onset   Heart attack Mother        deceased   Diabetes Mother    Heart attack Sister    Past Medical History:  Diagnosis Date   A-fib [facility])    a. (06/04/14) TEE-DC-CV; succesful; large LA 6.2 cm   ADHD    Ascites    Athlete's foot    Chronic systolic heart failure (HCC)    a. 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, unspecified CKD stage, unspecified whether long term insulin  use (HCC)    Heart murmur    HTN (hypertension)    OSA (obstructive sleep apnea)    PVD (peripheral vascular disease)    s/p great toe amputation   Current Outpatient Medications  Medication Sig Dispense Refill   Accu-Chek Softclix Lancets lancets 1 each by Other route daily. As instructed E11.22 100 each 11   Blood Glucose Monitoring Suppl (ACCU-CHEK AVIVA PLUS) w/Device KIT 1 Device by Does not apply route daily. E11.22 1 kit 11   clopidogrel  (PLAVIX ) 75 MG tablet TAKE 1 TABLET(75 MG) BY MOUTH DAILY 30 tablet 11   diclofenac  Sodium (VOLTAREN ) 1 % GEL Apply 1 Application topically 4 (four) times daily as needed (pain).     empagliflozin  (JARDIANCE ) 10 MG TABS tablet Take 1 tablet (10 mg total) by mouth daily. 90 tablet 3   furosemide  (LASIX ) 40 MG tablet Take 2 tablets (80 mg total) by mouth daily. One Tablet by mouth in the morning. 60 tablet 6    glucose blood (ACCU-CHEK AVIVA PLUS) test strip 1 each by Other route daily. As instructed E11.22 100 each 11   insulin  glargine (LANTUS ) 100 UNIT/ML injection INJECT 8 UNITS UNDER THE SKIN ONCE DAILY AS DIRECTED 10 mL 0   Insulin  Syringe-Needle U-100 (TRUEPLUS INSULIN  SYRINGE) 30G X 5/16 1 ML MISC USE ONCE DAILY FOR INSULIN  INJECTIONS. 100 each 2   losartan  (COZAAR ) 25 MG tablet Take 25 mg by mouth daily.     metolazone  (ZAROXOLYN ) 2.5 MG tablet Take 1 tablet (2.5 mg total) by mouth once for 1 dose. 1 tablet 0   metoprolol  succinate (TOPROL  XL) 50 MG 24 hr tablet Take 1 tablet (50 mg total) by mouth daily. Take with or immediately following a meal. 30 tablet 0   Misc Natural Products (JOINT SUPPORT PO) Take 1 Dose by mouth 3 (three) times a week.     potassium chloride  SA (KLOR-CON  M) 20 MEQ tablet Take 2 tablets (40 mEq total) by mouth once for 1 dose. 2 tablet 0   spironolactone  (ALDACTONE ) 25 MG tablet TAKE 1 TABLET(25 MG) BY MOUTH DAILY 90 tablet 2   thiamine (VITAMIN B-1) 100 MG tablet Take 100 mg by mouth daily.     warfarin (COUMADIN ) 6 MG tablet Take 1-2 tablets daily or as prescribed by Coumadin  Clinic 30 tablet 2   No current facility-administered medications for this visit.   There  were no vitals taken for this visit.  Wt Readings from Last 3 Encounters:  07/25/24 105.3 kg (232 lb 3.2 oz)  02/08/24 103.1 kg (227 lb 4.8 oz)  01/24/24 104.3 kg (230 lb)   PHYSICAL EXAM: General:  Sitting in chair No resp difficulty HEENT: normal x legally blind Neck: supple JVP to jaw . Carotids 2+ bilat; no bruits. No lymphadenopathy or thryomegaly appreciated. Cor: PMI nondisplaced.irreg  Lungs: clear Abdomen: soft, nontender, + distended. No hepatosplenomegaly. No bruits or masses. Good bowel sounds. Extremities: no cyanosis, clubbing, rash, 2+  edema Neuro: alert & orientedx3, cranial nerves grossly intact. moves all 4 extremities w/o difficulty. Affect - inappropriate at times. Flight  of ideas   ECG: AF 98 Personally reviewed   ASSESSMENT & PLAN:  1. Chronic HFmEF - Admitted 12/22 with marked volume overload. Not taking medications as prescribed. - Myoview 12/2015. EF 20% with defects noted on rest and stress images within the inferior wall and to lesser extent the septum suggest ischemic etiology. No reversibility. Refused cath multiple times. No s/s angina currently.  - Echo (10/21): EF 55-60%. - Echo (12/22): EF 45-50%, RV moderately reduced, RVSP 85 mmHg - Echo (5/24): EF 45-50%. RV normal  - NYHA III volume up. Will increase lasix  to 80 daily. Give metolazone  2.5 x 1 + Kcl 40. Follow up 1-2 weeks NP/PA  - Continue losartan  25 mg daily  - Restart Toprol  XL 50 mg daily  - Continue spiro 25 mg daily  - Continue Jardiance  10 mg daily. - Labs today  2. Chronic AF/ AFL - rate slightyl elevated. Restart metoprolol  - Continue warfarin. Followed by Coumadin  Clinic.   3. CKD Stage IIIa-IIIb - Baseline SCr 1.5-1.7  - on SGLT2i, Jardiance  - check labs   4. DMII - followed by PCP - on insulin , SGLT2i   5. HTN -BP up in setting of agitation and volume overload - Diurese and reassess  6.  H/o LV Thrombus - Resolved - On warfarin for AF/AFL  7. PAD/ Chronic LE Wounds  - Followed by vascular and podiatry for right heel wound - s/p percutaneous intervention by Dr. Gretta as outlined above 2/25  His care remains a challenge due to poor insight into the degree of his illness and his affect. We contacted his daughter by phone to discuss his care and notify of changes   I spent a total of 45 minutes today: 1) reviewing the patient's medical records including previous charts, labs and recent notes from other providers; 2) examining the patient and counseling them on their medical issues/explaining the plan of care; 3) adjusting meds as needed and 4) ordering lab work or other needed tests.      Harlene CHRISTELLA Gainer MD 12:42 PM

## 2024-08-07 ENCOUNTER — Telehealth (HOSPITAL_COMMUNITY): Payer: Self-pay

## 2024-08-07 NOTE — Telephone Encounter (Signed)
 Called to confirm/remind patient of their appointment at the Advanced Heart Failure Clinic on 08/08/24.   Appointment:   [] Confirmed  [x] Left mess   [] No answer/No voice mail  [] VM Full/unable to leave message  [] Phone not in service  And to bring in all medications and/or complete list.

## 2024-08-08 ENCOUNTER — Encounter (HOSPITAL_COMMUNITY)

## 2024-08-08 ENCOUNTER — Telehealth (HOSPITAL_COMMUNITY): Payer: Self-pay

## 2024-08-08 NOTE — Telephone Encounter (Signed)
 Called to confirm/remind patient of their appointment at the Advanced [facility] Failure Clinic on 08/09/01.   Appointment:   [x] Confirmed  [] Left mess   [] No answer/No voice mail  [] VM Full/unable to leave message  [] Phone not in service  Patient reminded to bring all medications and/or complete list.  Confirmed patient has transportation. Gave directions, instructed to utilize valet parking.

## 2024-08-09 ENCOUNTER — Encounter (HOSPITAL_COMMUNITY)

## 2024-08-10 ENCOUNTER — Ambulatory Visit

## 2024-08-11 ENCOUNTER — Telehealth (HOSPITAL_COMMUNITY): Payer: Self-pay | Admitting: *Deleted

## 2024-08-11 NOTE — Telephone Encounter (Signed)
 Called to confirm/remind patient of their appointment at the [facility] Clinic on  08/14/24:       Appointment:              [x] Confirmed             [] Left mess              [] No answer/No voice mail             [] Phone not in service   Patient reminded to bring all medications and/or complete list.   Confirmed patient has transportation. Gave directions, instructed to utilize valet parking.

## 2024-08-14 ENCOUNTER — Ambulatory Visit (HOSPITAL_COMMUNITY)
Admission: RE | Admit: 2024-08-14 | Discharge: 2024-08-14 | Disposition: A | Discharge status: Home / Self Care | Source: Ambulatory Visit | Attending: Family Medicine | Admitting: Family Medicine

## 2024-08-14 ENCOUNTER — Ambulatory Visit (HOSPITAL_COMMUNITY): Payer: Self-pay | Admitting: Family Medicine

## 2024-08-14 ENCOUNTER — Encounter (HOSPITAL_COMMUNITY): Payer: Self-pay

## 2024-08-14 VITALS — BP 138/84 | HR 93 | Wt 231.6 lb

## 2024-08-14 DIAGNOSIS — I5022 Chronic systolic (congestive) heart failure: Secondary | ICD-10-CM | POA: Insufficient documentation

## 2024-08-14 DIAGNOSIS — I10 Essential (primary) hypertension: Secondary | ICD-10-CM | POA: Diagnosis not present

## 2024-08-14 DIAGNOSIS — N183 Chronic kidney disease, stage 3 unspecified: Secondary | ICD-10-CM

## 2024-08-14 DIAGNOSIS — I480 Paroxysmal atrial fibrillation: Secondary | ICD-10-CM | POA: Insufficient documentation

## 2024-08-14 DIAGNOSIS — I513 Intracardiac thrombosis, not elsewhere classified: Secondary | ICD-10-CM | POA: Diagnosis not present

## 2024-08-14 DIAGNOSIS — E1122 Type 2 diabetes mellitus with diabetic chronic kidney disease: Secondary | ICD-10-CM | POA: Diagnosis not present

## 2024-08-14 DIAGNOSIS — Z794 Long term (current) use of insulin: Secondary | ICD-10-CM | POA: Diagnosis not present

## 2024-08-14 DIAGNOSIS — I482 Chronic atrial fibrillation, unspecified: Secondary | ICD-10-CM | POA: Insufficient documentation

## 2024-08-14 DIAGNOSIS — Z6835 Body mass index (BMI) 35.0-35.9, adult: Secondary | ICD-10-CM | POA: Diagnosis not present

## 2024-08-14 DIAGNOSIS — E1151 Type 2 diabetes mellitus with diabetic peripheral angiopathy without gangrene: Secondary | ICD-10-CM | POA: Insufficient documentation

## 2024-08-14 DIAGNOSIS — I739 Peripheral vascular disease, unspecified: Secondary | ICD-10-CM | POA: Diagnosis not present

## 2024-08-14 DIAGNOSIS — E669 Obesity, unspecified: Secondary | ICD-10-CM | POA: Diagnosis not present

## 2024-08-14 DIAGNOSIS — N1831 Chronic kidney disease, stage 3a: Secondary | ICD-10-CM | POA: Insufficient documentation

## 2024-08-14 DIAGNOSIS — F17210 Nicotine dependence, cigarettes, uncomplicated: Secondary | ICD-10-CM | POA: Diagnosis not present

## 2024-08-14 DIAGNOSIS — F17290 Nicotine dependence, other tobacco product, uncomplicated: Secondary | ICD-10-CM | POA: Diagnosis not present

## 2024-08-14 DIAGNOSIS — Z7984 Long term (current) use of oral hypoglycemic drugs: Secondary | ICD-10-CM | POA: Diagnosis not present

## 2024-08-14 DIAGNOSIS — Z7901 Long term (current) use of anticoagulants: Secondary | ICD-10-CM | POA: Insufficient documentation

## 2024-08-14 DIAGNOSIS — G4733 Obstructive sleep apnea (adult) (pediatric): Secondary | ICD-10-CM | POA: Diagnosis not present

## 2024-08-14 DIAGNOSIS — I130 Hypertensive heart and chronic kidney disease with heart failure and stage 1 through stage 4 chronic kidney disease, or unspecified chronic kidney disease: Secondary | ICD-10-CM | POA: Diagnosis not present

## 2024-08-14 DIAGNOSIS — F32A Depression, unspecified: Secondary | ICD-10-CM | POA: Diagnosis not present

## 2024-08-14 DIAGNOSIS — Z79899 Other long term (current) drug therapy: Secondary | ICD-10-CM | POA: Insufficient documentation

## 2024-08-14 LAB — BASIC METABOLIC PANEL WITH GFR
Anion gap: 10 (ref 5–15)
BUN: 52 mg/dL — ABNORMAL HIGH (ref 8–23)
CO2: 27 mmol/L (ref 22–32)
Calcium: 8.9 mg/dL (ref 8.9–10.3)
Chloride: 100 mmol/L (ref 98–111)
Creatinine, Ser: 2.39 mg/dL — ABNORMAL HIGH (ref 0.61–1.24)
GFR, Estimated: 28 mL/min — ABNORMAL LOW (ref >60)
Glucose, Bld: 154 mg/dL — ABNORMAL HIGH (ref 70–99)
Potassium: 4.8 mmol/L (ref 3.5–5.1)
Sodium: 137 mmol/L (ref 135–145)

## 2024-08-14 MED ORDER — METOPROLOL SUCCINATE ER 50 MG PO TB24: 50.0000 mg | ORAL_TABLET | Freq: Every day | ORAL | 0 refills | Status: DC

## 2024-08-14 MED ORDER — EMPAGLIFLOZIN 10 MG PO TABS: 10.0000 mg | ORAL_TABLET | Freq: Every day | ORAL | 3 refills | Status: AC

## 2024-08-14 NOTE — Patient Instructions (Signed)
 Medication Changes:  No Changes In Medications at this time.   Lab Work:  Labs done today, your results will be available in MyChart, we will contact you for abnormal readings.  Follow-Up in: 6 months with Dr. Bensimhon PLEASE CALL OUR OFFICE AROUND [place] TO GET SCHEDULED FOR YOUR APPOINTMENT. PHONE NUMBER IS [phone] OPTION 2   At the Advanced Heart Failure Clinic, you and your health needs are our priority. We have a designated team specialized in the treatment of Heart Failure. This Care Team includes your primary Heart Failure Specialized Cardiologist (physician), Advanced Practice Providers (APPs- Physician Assistants and Nurse Practitioners), and Pharmacist who all work together to provide you with the care you need, when you need it.   You may see any of the following providers on your designated Care Team at your next follow up:  Dr. Toribio Fuel Dr. Ezra Shuck Dr. Ria Commander Dr. Odis Brownie Greig Mosses, NP Caffie Shed, [place] [facility] [place], [place] Beckey Coe, NP [place] Lee, NP Tinnie Redman, PharmD   Please be sure to bring in all your medications bottles to every appointment.   Need to Contact Us :  If you have any questions or concerns before your next appointment please send us  a message through [place] or call our office at [phone].    TO LEAVE A MESSAGE FOR THE NURSE SELECT OPTION 2, PLEASE LEAVE A MESSAGE INCLUDING: YOUR NAME DATE OF BIRTH CALL BACK NUMBER REASON FOR CALL**this is important as we prioritize the call backs  YOU WILL RECEIVE A CALL BACK THE SAME DAY AS LONG AS YOU CALL BEFORE 4:00 PM

## 2024-08-14 NOTE — Progress Notes (Signed)
 ReDS Vest / Clip - 08/14/24 1400       ReDS Vest / Clip   Station Marker D    Ruler Value 43    ReDS Value Range Low volume    ReDS Actual Value 23

## 2024-08-14 NOTE — Progress Notes (Addendum)
 Patient ID: [name], male   DOB: 1951/05/30, 73 y.o.   MRN: [id number]    Advanced Heart Failure Clinic Note   PCP: [facility] Primary Cardiologist: Dr. Burnard HF Cardiologist: Dr. Cherrie  HPI: [name] is a 73 y.o. male with PAF, severe depression, DM2, CKD stage III, obesity, OSA and chronic systolic HF.  Admitted 6/15 with dyspena and LE edema. Found to be in afib with RVR.  Admitted 12/2015 wit volume overload/Afib RVR  in the setting of medication compliance. Had TEE but DC-CV was cancelled due to LA thrombus.   Admitted 03/2016 with  A fib RVR and volume overload in the setting of medication noncompliance. Refused cath, Myoview 12/2015. EF 20% with defects noted on rest and stress images within the inferior wall and to lesser extent the septum suggest ischemic etiology. No reversibility. Psychiatry met with him and he was deemed to have the ability to make decisions.   Admitted 12/22 with ADHF. Echo EF 45-50%. Diuresed 19 liters. Transitioned to lasix  20 mg daily. Hospital course c/b AKI and A F Underwent TEE-DC-CV with conversion to NSR. He was continued on amiodarone  200 mg daily. He was back in AF on discharge.   Admitted 2//23 after mechanical fall resulting in rib fractures and PTX.  AC stopped due to risk of fall. Discharged 12/15/21.   In 8/23 had critical limb ischemia of RLE with non-healing ulcer -> underwent right posterior tibial angioplasty by Dr. Gretta   Refused repeat DC-CV for AF multiple times. -> chronic AF  Echo 5/24 EF 45-50%. Smoking 1/2 ppd. LE wounds healed.  2/25 underwent repeat LE angiogram by Dr. Gretta for critical limb ischemia and tissue loss (rt achilles wound). Angiogram showed the right common femoral, profunda and SFA were widely patent.  He had a high-grade 90% below-knee popliteal focal stenosis.  Dominant runoff in the right lower extremity is through the posterior tibial and proximally there was a high-grade 99%  stenosis at the ostium with a diffusely diseased vessel proximally.  The peroneal was patent but then occluded in the distal calf.  The anterior tibial was occluded. Ultimately the posterior tibial stenosis including ostium was treated with percutaneous intervention with widely patent vessel at completion.  The below-knee popliteal artery was then treated with shockwave lithotripsy w/ widely patent vessel at completion.  Preserved runoff into the foot.  Today he returns for HF follow up with his daughter. Overall feeling fine. Denies increasing SOB, palpitations, abnormal bleeding, CP, dizziness, edema, or PND/Orthopnea. Appetite ok. Taking all medications. He stopped losartan  x 1 month ago. Still smoking. Not interested in new meds. Daughter asking if herbal supplements or vitamins will strengthen heart.  Cardiac Testing  - Echo 5/24: EF 45-50% - TEE (12/22): EF 35% No PFO. No thrombus.  - DC-CV (12/22) --> NSR  - Echo (12/22): EF 45-50% - Echo (2021): EF 55-60% - Echo (5/17): EF 15-20%  - Myoview 2/17 Perfusion: Next defects noted on rest and stress images within the inferior wall and to lesser extent the septum. No reversible defects. Wall Motion: Severe generalized hypokinesia. Left ventricle is dilute Left Ventricular Ejection Fraction: 20 % End diastolic volume 218 ml End systolic volume 173 ml  1. 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 No evidence of ischemia. 2. Severe generalized hypokinesia with dilated left ventricle. 3. Left ventricular ejection fraction 20% 4. High-risk stress test findings*.  ROS: All systems negative except as listed in HPI,  PMH and Problem List.  SH:  Social History   Socioeconomic History   Marital status: Divorced    Spouse name: Not on file   Number of children: Not on file   Years of education: Not on file   Highest education level: Not on file  Occupational History   Occupation: retired  Tobacco Use    Smoking status: Some Days    Current packs/day: 0.00    Types: Cigarettes, Cigars    Start date: 05/1969    Last attempt to quit: 05/2021    Years since quitting: 3.2    Passive exposure: Never   Smokeless tobacco: Never  Vaping Use   Vaping status: Never Used  Substance and Sexual Activity   Alcohol use: Not Currently   Drug use: Never   Sexual activity: Not on file  Other Topics Concern   Not on file  Social History Narrative   ** Merged History Encounter ** Lives in [place] by himself. Retired from [employer] for [employer].       Tobacco use, amount per day now:   Past tobacco use, amount per day:   How many years did you use tobacco:   Alcohol use (drinks per week): N/A   Diet:   Do you drink/eat things with caffeine:   Marital status:  Divorced                                What year were you married? 2003   Do you live in a house, apartment, assisted living, condo, trailer, etc.? House   Is it one or more stories? One   How many persons live in your home?   Do you have pets in your home?( please list) N/A   Highest Level of education completed? Bachelors Degree   Current or past profession: Teacher-Special Ed   Do you exercise?  Yes                                Type and how often? Daily squats, and push ups.   Do you have a living will? No   Do you have a DNR form?  No                                 If not, do you want to discuss one?   Do you have signed POA/HPOA forms?  No                      If so, please bring to you appointment      Do you have any difficulty bathing or dressing yourself? Bathing only if pants not shirts/not shorts.   Do you have any difficulty preparing food or eating? No   Do you have any difficulty managing your medications? No   Do you have any difficulty managing your finances? No   Do you have any difficulty affording your medications? No   Social Drivers of Corporate investment banker Strain: Not on file  Food  Insecurity: No Food Insecurity (11/10/2021)   Hunger Vital Sign    Worried About Running Out of Food in the Last Year: Never true    Ran Out of Food in the Last Year: Never true  Transportation Needs: No Transportation  Needs (11/10/2021)   PRAPARE - Administrator, Civil Service (Medical): No    Lack of Transportation (Non-Medical): No  Physical Activity: Not on file  Stress: Not on file  Social Connections: Not on file  Intimate Partner Violence: Not on file   FH:  Family History  Problem Relation Age of Onset   Heart attack Mother        deceased   Diabetes Mother    Heart attack Sister    Past Medical History:  Diagnosis Date   A-fib [facility])    a. (06/04/14) TEE-DC-CV; succesful; large LA 6.2 cm   ADHD    Ascites    Athlete's foot    Chronic systolic heart failure (HCC)    a. 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, unspecified CKD stage, unspecified whether long term insulin  use (HCC)    Heart murmur    HTN (hypertension)    OSA (obstructive sleep apnea)    PVD (peripheral vascular disease)    s/p great toe amputation   Current Outpatient Medications  Medication Sig Dispense Refill   Accu-Chek Softclix Lancets lancets 1 each by Other route daily. As instructed E11.22 100 each 11   Blood Glucose Monitoring Suppl (ACCU-CHEK AVIVA PLUS) w/Device KIT 1 Device by Does not apply route daily. E11.22 1 kit 11   clopidogrel  (PLAVIX ) 75 MG tablet TAKE 1 TABLET(75 MG) BY MOUTH DAILY 30 tablet 11   empagliflozin  (JARDIANCE ) 10 MG TABS tablet Take 1 tablet (10 mg total) by mouth daily. 90 tablet 3   furosemide  (LASIX ) 40 MG tablet Take 2 tablets (80 mg total) by mouth daily. One Tablet by mouth in the morning. 60 tablet 6   metoprolol  succinate (TOPROL  XL) 50 MG 24 hr tablet  Take 1 tablet (50 mg total) by mouth daily. Take with or immediately following a meal. 30 tablet 0   spironolactone  (ALDACTONE ) 25 MG tablet TAKE 1 TABLET(25 MG) BY MOUTH DAILY 90 tablet 2   warfarin (COUMADIN ) 6 MG tablet Take 1-2 tablets daily or as prescribed by Coumadin  Clinic 30 tablet 2   diclofenac  Sodium (VOLTAREN ) 1 % GEL Apply 1 Application topically 4 (four) times daily as needed (pain). (Patient not taking: Reported on 08/14/2024)     glucose blood (ACCU-CHEK AVIVA PLUS) test strip 1 each by Other route daily. As instructed E11.22 (Patient not taking: Reported on 08/14/2024) 100 each 11   insulin  glargine (LANTUS ) 100 UNIT/ML injection INJECT 8 UNITS UNDER THE SKIN ONCE DAILY AS DIRECTED (Patient not taking: Reported on 08/14/2024) 10 mL 0   Insulin  Syringe-Needle U-100 (TRUEPLUS INSULIN  SYRINGE) 30G X 5/16 1 ML MISC USE ONCE DAILY FOR INSULIN  INJECTIONS. (Patient not taking: Reported on 08/14/2024) 100 each 2   losartan  (COZAAR ) 25 MG tablet Take 25 mg by mouth daily. (Patient not taking: Reported on 08/14/2024)     metolazone  (ZAROXOLYN ) 2.5 MG tablet Take 1 tablet (2.5 mg total) by mouth once for 1 dose. (Patient not taking: Reported on 08/14/2024) 1 tablet 0   Misc Natural Products (JOINT SUPPORT PO) Take 1 Dose by mouth 3 (three) times a week. (Patient not taking: Reported on 08/14/2024)     potassium chloride  SA (KLOR-CON  M) 20 MEQ tablet Take 2 tablets (40 mEq total) by mouth  once for 1 dose. (Patient not taking: Reported on 08/14/2024) 2 tablet 0   thiamine (VITAMIN B-1) 100 MG tablet Take 100 mg by mouth daily. (Patient not taking: Reported on 08/14/2024)     No current facility-administered medications for this encounter.   BP 138/84   Pulse 93   Wt 105.1 kg (231 lb 9.6 oz)   SpO2 95%   BMI 35.21 kg/m   Wt Readings from Last 3 Encounters:  08/14/24 105.1 kg (231 lb 9.6 oz)  07/25/24 105.3 kg (232 lb 3.2 oz)  02/08/24 103.1 kg (227 lb 4.8 oz)   PHYSICAL EXAM: General:  NAD.  No resp difficulty, walked into clinic, walked into clinic with cane HEENT: Normal Neck: Supple. No JVD. Thick neck Cor: Irregular rate & rhythm. No rubs, gallops or murmurs. Lungs: Clear Abdomen: Soft, obese, nontender, nondistended.  Extremities: No cyanosis, clubbing, rash, edema; bilateral ortho shoes on Neuro: Alert & oriented x 3, moves all 4 extremities w/o difficulty. Agitated affect  ReDs reading: 23 %, abnormal  ASSESSMENT & PLAN: 1. Chronic HFmEF - Admitted 12/22 with marked volume overload. Not taking medications as prescribed. - Myoview 12/2015. EF 20% with defects noted on rest and stress images within the inferior wall and to lesser extent the septum suggest ischemic etiology. No reversibility. Refused cath multiple times. No s/s angina currently.  - Echo (10/21): EF 55-60%. - Echo (12/22): EF 45-50%, RV moderately reduced, RVSP 85 mmHg - Echo (5/24): EF 45-50%. RV normal  - NYHA II-early III, functional class confounded by PAD. Volume OK today. Reds 23% - Continue Lasix  80 mg daily.  - Patient refuses to restart losartan . - Continue Toprol  XL 50 mg daily.  - Continue spiro 25 mg daily.  - Continue Jardiance  10 mg daily. - Labs today  2. Chronic AF/ AFL - Rate controlled - Continue warfarin.  - Followed by Coumadin  Clinic.   3. CKD Stage IIIa-IIIb - Baseline SCr 1.5-1.7  - on SGLT2i, Jardiance  - Labs today  4. DMII - followed by PCP - on insulin , SGLT2i   5. HTN - BP up in setting of agitation - Recommended re-starting losartan  but patient declines  6.  H/o LV Thrombus - Resolved - On warfarin for AF/AFL  7. PAD/ Chronic LE Wounds  - Followed by vascular and podiatry for right heel wound - s/p percutaneous intervention by Dr. Gretta as outlined above 2/25  His care remains a challenge due to poor insight into the degree of his illness and his affect. We contacted his daughter by phone to discuss his care and notify of changes   Follow up in 6 months  with Dr. Bensimhon.  Harlene HERO Rexford Prevo FNP-BC 2:48 PM

## 2024-08-15 ENCOUNTER — Ambulatory Visit

## 2024-08-15 ENCOUNTER — Encounter (HOSPITAL_COMMUNITY)

## 2024-08-15 ENCOUNTER — Ambulatory Visit (HOSPITAL_COMMUNITY): Admission: RE | Admit: 2024-08-15 | Source: Ambulatory Visit

## 2024-08-15 ENCOUNTER — Telehealth: Payer: Self-pay | Admitting: Physician Assistant

## 2024-08-15 NOTE — Telephone Encounter (Signed)
   The patient's daughter Wandalee called the answering service after-hours today returning call to CHF clinic team. Per note below this, Harlene had instructed Hold Lasix  x 3 days, then resume at 40 mg daily. Repeat BMET in 7-10 days. Please call daughter, she manages his meds. Nurse Powell tried to call patient today and LMTCB.  I relayed this to daughter who verbalized understanding and gratitude. She stated he already took dose for today since call went out this afternoon. He will increase oral fluid intake today but not to excess. Will cc back to CHF team to enact the plan for BMET. Daughter is requesting whether this can be done at a different location. I told her CHF clinic would call to discuss location.  Kayline Sheer N Bliss Behnke, PA-C

## 2024-08-16 MED ORDER — FUROSEMIDE 40 MG PO TABS
40.0000 mg | ORAL_TABLET | Freq: Every day | ORAL | Status: DC
Start: 2024-08-16 — End: 2024-08-21

## 2024-08-21 ENCOUNTER — Telehealth (HOSPITAL_COMMUNITY): Payer: Self-pay

## 2024-08-21 DIAGNOSIS — I5022 Chronic systolic (congestive) heart failure: Secondary | ICD-10-CM

## 2024-08-21 MED ORDER — FUROSEMIDE 40 MG PO TABS
ORAL_TABLET | ORAL | 5 refills | Status: AC
Start: 2024-08-21 — End: ?

## 2024-08-21 NOTE — Telephone Encounter (Signed)
 Patient's furosemide  medication has been sent to his pharmacy.

## 2024-08-24 ENCOUNTER — Encounter

## 2024-08-28 ENCOUNTER — Other Ambulatory Visit: Payer: Self-pay | Admitting: Family

## 2024-08-28 DIAGNOSIS — E1122 Type 2 diabetes mellitus with diabetic chronic kidney disease: Secondary | ICD-10-CM

## 2024-08-29 NOTE — Telephone Encounter (Signed)
 Please schedule appointment prior to refilling medication has not been seen over one year.

## 2024-08-29 NOTE — Telephone Encounter (Signed)
 Patient hasn't been seen in over a year , and has no upcoming appointments. How would you like for us  to go about his medication refill ?

## 2024-08-30 NOTE — Telephone Encounter (Signed)
 Spoke with patients daughter Wandalee regarding patient needs an appointment due to him not being seen in over a year. Patients daughter stated he doesn't need insulin  because he doesn't want to take it. Patients daughter stated that patient is trying to do natural remedies. She also stated that one bottle of insulin  last for 3 months. She declined the appointment and stated that she will contact us  in about 3 months regarding setting up an appointment for him. Routed to patients pcp for further review.

## 2024-09-04 ENCOUNTER — Ambulatory Visit: Attending: Cardiology | Admitting: Pharmacist

## 2024-09-04 DIAGNOSIS — Z5181 Encounter for therapeutic drug level monitoring: Secondary | ICD-10-CM

## 2024-09-04 DIAGNOSIS — I483 Typical atrial flutter: Secondary | ICD-10-CM | POA: Diagnosis not present

## 2024-09-04 DIAGNOSIS — I513 Intracardiac thrombosis, not elsewhere classified: Secondary | ICD-10-CM

## 2024-09-04 LAB — POCT INR: INR: 2.2 (ref 2.0–3.0)

## 2024-09-04 MED ORDER — WARFARIN SODIUM 6 MG PO TABS: ORAL_TABLET | ORAL | 2 refills | Status: DC

## 2024-09-04 NOTE — Patient Instructions (Addendum)
 Description   INR 2.2: Continue taking warfarin 1/2 tablet daily except for 1 tablet on Wednesdays, and Fridays.  Recheck INR in 6 weeks.  Coumadin  Clinic [phone]

## 2024-09-04 NOTE — Addendum Note (Signed)
 Addended by: DARRELL BRUCKNER on: 09/04/2024 03:21 PM   Modules accepted: Orders

## 2024-09-04 NOTE — Progress Notes (Signed)
 Description   INR 2.2: Continue taking warfarin 1/2 tablet daily except for 1 tablet on Wednesdays, and Fridays.  Recheck INR in 6 weeks.  Coumadin  Clinic [phone]

## 2024-09-10 ENCOUNTER — Other Ambulatory Visit (HOSPITAL_COMMUNITY): Payer: Self-pay | Admitting: Family Medicine

## 2024-09-19 ENCOUNTER — Encounter (HOSPITAL_COMMUNITY)

## 2024-09-19 ENCOUNTER — Ambulatory Visit

## 2024-10-03 ENCOUNTER — Ambulatory Visit

## 2024-10-03 ENCOUNTER — Ambulatory Visit (HOSPITAL_COMMUNITY): Admission: RE | Admit: 2024-10-03 | Source: Ambulatory Visit

## 2024-10-03 ENCOUNTER — Ambulatory Visit (HOSPITAL_COMMUNITY): Attending: Vascular Surgery

## 2024-10-03 NOTE — Progress Notes (Deleted)
 HISTORY AND PHYSICAL     CC:  follow up. Requesting Provider:  Leonarda Roxan BROCKS, NP  HPI: This is a 73 y.o. male who is here today for follow up for PAD.  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  He had been having pain and blistering for about 7 weeks that had progressed.  It was frankly necrotic with maggot infestation.  He did not have any claudication or rest pain.     On May 19, 2021, he underwent left peroneal angioplasty via right CFA by Dr. Gretta.  On May 22, 2021, he underwent left 5th toe amputation also by Dr. Gretta.  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 Gretta.  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 Gretta.  On 01/06/2024, he underwent angiogram with right PTA angioplasty, right BK popliteal shockwave lithotripsy for CLI with tissue loss (Achilles wound) by Dr. Gretta.   Pt was last seen 02/08/2024 and at that time, he was not having any rest pain and his daughter was continuing to do dressing changes to the achilles wound.  Wound care center also reported good progress.   The pt returns today for follow up.  ***  The pt is not on a statin for cholesterol management.    The pt is not on an aspirin .    Other AC:  Plavix  The pt is on BB, diuretic for hypertension.  The pt is  on diabetic medication. Tobacco hx:  ***  Pt does *** have family hx of AAA.  Past Medical History:  Diagnosis Date   A-fib [facility])    a. (06/04/14) TEE-DC-CV; succesful; large LA 6.2 cm   ADHD    Ascites    Athlete's foot    Chronic systolic heart failure (HCC)    a. 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, unspecified CKD stage, unspecified whether long term insulin  use (HCC)    Heart murmur    HTN (hypertension)    OSA (obstructive sleep apnea)    PVD (peripheral vascular disease)    s/p great toe amputation    Past Surgical History:  Procedure Laterality Date   ABDOMINAL AORTOGRAM W/LOWER EXTREMITY N/A 05/19/2021   Procedure: ABDOMINAL AORTOGRAM W/LOWER EXTREMITY;  Surgeon: Gretta Lonni PARAS, MD;  Location: [facility];  Service: Cardiovascular;  Laterality: N/A;   ABDOMINAL AORTOGRAM W/LOWER EXTREMITY Right 06/11/2022   Procedure: ABDOMINAL AORTOGRAM W/LOWER EXTREMITY;  Surgeon: Gretta Lonni PARAS, MD;  Location: [facility];  Service: Cardiovascular;  Laterality: Right;   ABDOMINAL AORTOGRAM W/LOWER EXTREMITY Left 12/10/2022   Procedure: ABDOMINAL AORTOGRAM W/LOWER EXTREMITY;  Surgeon: Gretta Lonni PARAS, MD;  Location: [facility];  Service: Cardiovascular;  Laterality: Left;   ABDOMINAL AORTOGRAM W/LOWER EXTREMITY Right 01/06/2024   Procedure: ABDOMINAL AORTOGRAM W/LOWER EXTREMITY;  Surgeon: Gretta Lonni PARAS, MD;  Location: [facility];  Service: Cardiovascular;  Laterality: Right;   AMPUTATION Left 05/22/2021   Procedure: LEFT FIFTH TOE AMPUTATION;  Surgeon: Gretta Lonni PARAS, MD;  Location: [facility]  OR;  Service: Vascular;  Laterality: Left;   CARDIOVERSION N/A 06/04/2014   Procedure: CARDIOVERSION;  Surgeon: Ezra GORMAN Shuck, MD;  Location: [facility] ENDOSCOPY;  Service: Cardiovascular;  Laterality: N/A;   CARDIOVERSION N/A 11/06/2021   Procedure: CARDIOVERSION;  Surgeon: Shuck Ezra GORMAN, MD;  Location: [facility] ENDOSCOPY;  Service: Cardiovascular;  Laterality: N/A;   NOSE SURGERY     Nasal septum surgery   PERIPHERAL VASCULAR BALLOON ANGIOPLASTY  06/11/2022   Procedure: PERIPHERAL VASCULAR BALLOON ANGIOPLASTY;  Surgeon: Gretta Lonni PARAS, MD;  Location: [facility];  Service: Cardiovascular;;    PERIPHERAL VASCULAR BALLOON ANGIOPLASTY Left 12/10/2022   Procedure: PERIPHERAL VASCULAR BALLOON ANGIOPLASTY;  Surgeon: Gretta Lonni PARAS, MD;  Location: [facility];  Service: Cardiovascular;  Laterality: Left;  Peroneal   PERIPHERAL VASCULAR BALLOON ANGIOPLASTY  01/06/2024   Procedure: PERIPHERAL VASCULAR BALLOON ANGIOPLASTY;  Surgeon: Gretta Lonni PARAS, MD;  Location: [facility];  Service: Cardiovascular;;  PT,  Shockwave Popliteal   PERIPHERAL VASCULAR INTERVENTION Left 05/19/2021   Procedure: PERIPHERAL VASCULAR INTERVENTION;  Surgeon: Gretta Lonni PARAS, MD;  Location: [facility];  Service: Cardiovascular;  Laterality: Left;   TEE WITHOUT CARDIOVERSION N/A 06/04/2014   Procedure: TRANSESOPHAGEAL ECHOCARDIOGRAM (TEE);  Surgeon: Ezra GORMAN Shuck, MD;  Location: [facility] ENDOSCOPY;  Service: Cardiovascular;  Laterality: N/A;   TEE WITHOUT CARDIOVERSION N/A 01/06/2016   Procedure: TRANSESOPHAGEAL ECHOCARDIOGRAM (TEE);  Surgeon: Vina Okey GAILS, MD;  Location: [facility] ENDOSCOPY;  Service: Cardiovascular;  Laterality: N/A;   TEE WITHOUT CARDIOVERSION N/A 11/06/2021   Procedure: TRANSESOPHAGEAL ECHOCARDIOGRAM (TEE);  Surgeon: Shuck Ezra GORMAN, MD;  Location: [facility] ENDOSCOPY;  Service: Cardiovascular;  Laterality: N/A;   VASECTOMY      No Known Allergies  Current Outpatient Medications  Medication Sig Dispense Refill   Accu-Chek Softclix Lancets lancets 1 each by Other route daily. As instructed E11.22 100 each 11   Blood Glucose Monitoring Suppl (ACCU-CHEK AVIVA PLUS) w/Device KIT 1 Device by Does not apply route daily. E11.22 1 kit 11   clopidogrel  (PLAVIX ) 75 MG tablet TAKE 1 TABLET(75 MG) BY MOUTH DAILY 30 tablet 11   diclofenac  Sodium (VOLTAREN ) 1 % GEL Apply 1 Application topically 4 (four) times daily as needed (pain). (Patient not taking: Reported on 08/14/2024)     empagliflozin  (JARDIANCE ) 10 MG TABS tablet Take 1 tablet (10 mg total) by mouth daily. 90 tablet 3   furosemide   (LASIX ) 40 MG tablet Patient takes 1 tablet by mouth Daily in the morning. 30 tablet 5   glucose blood (ACCU-CHEK AVIVA PLUS) test strip 1 each by Other route daily. As instructed E11.22 (Patient not taking: Reported on 08/14/2024) 100 each 11   insulin  glargine (LANTUS ) 100 UNIT/ML injection INJECT 8 UNITS UNDER THE SKIN ONCE DAILY AS DIRECTED (Patient not taking: Reported on 08/14/2024) 10 mL 0   Insulin  Syringe-Needle U-100 (TRUEPLUS INSULIN  SYRINGE) 30G X 5/16 1 ML MISC USE ONCE DAILY FOR INSULIN  INJECTIONS. (Patient not taking: Reported on 08/14/2024) 100 each 2   metoprolol  succinate (TOPROL -XL) 50 MG 24 hr tablet TAKE 1 TABLET(50 MG) BY MOUTH DAILY WITH OR IMMEDIATELY FOLLOWING A MEAL 30 tablet 3   Misc Natural Products (JOINT SUPPORT PO) Take 1 Dose by mouth 3 (three) times a week. (Patient not taking: Reported on 08/14/2024)     spironolactone  (ALDACTONE ) 25 MG tablet TAKE 1 TABLET(25 MG) BY MOUTH DAILY 90 tablet 2   thiamine (VITAMIN B-1) 100 MG tablet Take 100 mg by mouth daily. (Patient  not taking: Reported on 08/14/2024)     warfarin (COUMADIN ) 6 MG tablet Take 1-2 tablets daily or as prescribed by Coumadin  Clinic 30 tablet 2   No current facility-administered medications for this visit.    Family History  Problem Relation Age of Onset   Heart attack Mother        deceased   Diabetes Mother    Heart attack Sister     Social History   Socioeconomic History   Marital status: Divorced    Spouse name: Not on file   Number of children: Not on file   Years of education: Not on file   Highest education level: Not on file  Occupational History   Occupation: retired  Tobacco Use   Smoking status: Some Days    Current packs/day: 0.00    Types: Cigarettes, Cigars    Start date: 05/1969    Last attempt to quit: 05/2021    Years since quitting: 3.4    Passive exposure: Never   Smokeless tobacco: Never  Vaping Use   Vaping status: Never Used  Substance and Sexual Activity    Alcohol use: Not Currently   Drug use: Never   Sexual activity: Not on file  Other Topics Concern   Not on file  Social History Narrative   ** Merged History Encounter ** Lives in [place] by himself. Retired from [employer] for [employer].       Tobacco use, amount per day now:   Past tobacco use, amount per day:   How many years did you use tobacco:   Alcohol use (drinks per week): N/A   Diet:   Do you drink/eat things with caffeine:   Marital status:  Divorced                                What year were you married? 2003   Do you live in a house, apartment, assisted living, condo, trailer, etc.? House   Is it one or more stories? One   How many persons live in your home?   Do you have pets in your home?( please list) N/A   Highest Level of education completed? Bachelors Degree   Current or past profession: Teacher-Special Ed   Do you exercise?  Yes                                Type and how often? Daily squats, and push ups.   Do you have a living will? No   Do you have a DNR form?  No                                 If not, do you want to discuss one?   Do you have signed POA/HPOA forms?  No                      If so, please bring to you appointment      Do you have any difficulty bathing or dressing yourself? Bathing only if pants not shirts/not shorts.   Do you have any difficulty preparing food or eating? No   Do you have any difficulty managing your medications? No   Do you have any difficulty managing your finances? No   Do you  have any difficulty affording your medications? No   Social Drivers of Corporate Investment Banker Strain: Not on file  Food Insecurity: No Food Insecurity (11/10/2021)   Hunger Vital Sign    Worried About Running Out of Food in the Last Year: Never true    Ran Out of Food in the Last Year: Never true  Transportation Needs: No Transportation Needs (11/10/2021)   PRAPARE - Administrator, Civil Service (Medical):  No    Lack of Transportation (Non-Medical): No  Physical Activity: Not on file  Stress: Not on file  Social Connections: Not on file  Intimate Partner Violence: Not on file     REVIEW OF SYSTEMS:  *** [X]  denotes positive finding, [ ]  denotes negative finding Cardiac  Comments:  Chest pain or chest pressure:    Shortness of breath upon exertion:    Short of breath when lying flat:    Irregular heart rhythm:        Vascular    Pain in calf, thigh, or hip brought on by ambulation:    Pain in feet at night that wakes you up from your sleep:     Blood clot in your veins:    Leg swelling:         Pulmonary    Oxygen  at home:    Productive cough:     Wheezing:         Neurologic    Sudden weakness in arms or legs:     Sudden numbness in arms or legs:     Sudden onset of difficulty speaking or slurred speech:    Temporary loss of vision in one eye:     Problems with dizziness:         Gastrointestinal    Blood in stool:     Vomited blood:         Genitourinary    Burning when urinating:     Blood in urine:        Psychiatric    Major depression:         Hematologic    Bleeding problems:    Problems with blood clotting too easily:        Skin    Rashes or ulcers:        Constitutional    Fever or chills:      PHYSICAL EXAMINATION:  ***  General:  WDWN in NAD; vital signs documented above Gait: Not observed HENT: WNL, normocephalic Pulmonary: normal non-labored breathing , without wheezing Cardiac: {Desc; regular/irreg:14544} HR, {With/Without:20273} carotid bruit*** Abdomen: soft, NT; aortic pulse is *** palpable Skin: {With/Without:20273} rashes Vascular Exam/Pulses:  Right Left  Radial {Exam; arterial pulse strength 0-4:30167} {Exam; arterial pulse strength 0-4:30167}  Femoral {Exam; arterial pulse strength 0-4:30167} {Exam; arterial pulse strength 0-4:30167}  Popliteal {Exam; arterial pulse strength 0-4:30167} {Exam; arterial pulse strength  0-4:30167}  DP {Exam; arterial pulse strength 0-4:30167} {Exam; arterial pulse strength 0-4:30167}  PT {Exam; arterial pulse strength 0-4:30167} {Exam; arterial pulse strength 0-4:30167}  Peroneal *** ***   Extremities: {With/Without:20273} ischemic changes, {With/Without:20273} Gangrene , {With/Without:20273} cellulitis; {With/Without:20273} open wounds Musculoskeletal: no muscle wasting or atrophy  Neurologic: A&O X 3 Psychiatric:  The pt has {Desc; normal/abnormal:11317::Normal} affect.   Non-Invasive Vascular Imaging:   ABI's/TBI's on 10/03/2024: Right:  *** - Great toe pressure: *** Left:  *** - Great toe pressure: ***  Arterial duplex on 10/03/2024: ***  Previous ABI's/TBI's on 02/01/2024: Right:  1.02/0.60 - Great toe pressure:  117 Left:  0.95/0.54 - Great toe pressure:  105  Previous arterial duplex on 02/01/2024: Right: Patent lower extremity arterial system without evidence of  hemodynamically significant stenosis.     ASSESSMENT/PLAN:: 73 y.o. male here for follow up for PAD with hx of  left peroneal angioplasty via right CFA 05/19/2021 by Dr. Gretta.  On May 22, 2021, he underwent left 5th toe amputation also by Dr. Gretta.  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 Gretta.  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 Gretta.  On 01/06/2024, he underwent angiogram with right PTA angioplasty, right BK popliteal shockwave lithotripsy for CLI with tissue loss (Achilles wound) by Dr. Gretta.    -*** -continue plavix  -discussed importance of increased walking daily -pt will f/u in *** with ***.   Lucie Apt, [facility] [phone]  Clinic MD:   Gretta

## 2024-10-16 ENCOUNTER — Ambulatory Visit: Attending: Family

## 2024-10-24 ENCOUNTER — Emergency Department (HOSPITAL_BASED_OUTPATIENT_CLINIC_OR_DEPARTMENT_OTHER): Admission: EM | Admit: 2024-10-24 | Discharge: 2024-10-24 | Discharge status: Against Medical Advice

## 2024-10-24 ENCOUNTER — Encounter: Payer: Self-pay | Admitting: Family

## 2024-10-24 ENCOUNTER — Other Ambulatory Visit: Payer: Self-pay

## 2024-10-24 ENCOUNTER — Emergency Department (HOSPITAL_BASED_OUTPATIENT_CLINIC_OR_DEPARTMENT_OTHER)

## 2024-10-24 ENCOUNTER — Ambulatory Visit: Admitting: Family

## 2024-10-24 VITALS — BP 136/88 | HR 100 | Temp 97.8°F | Resp 20 | Ht 68.0 in | Wt 229.0 lb

## 2024-10-24 DIAGNOSIS — I5022 Chronic systolic (congestive) heart failure: Secondary | ICD-10-CM

## 2024-10-24 DIAGNOSIS — Z794 Long term (current) use of insulin: Secondary | ICD-10-CM | POA: Diagnosis not present

## 2024-10-24 DIAGNOSIS — E1122 Type 2 diabetes mellitus with diabetic chronic kidney disease: Secondary | ICD-10-CM

## 2024-10-24 DIAGNOSIS — X58XXXA Exposure to other specified factors, initial encounter: Secondary | ICD-10-CM | POA: Diagnosis not present

## 2024-10-24 DIAGNOSIS — S90931A Unspecified superficial injury of right great toe, initial encounter: Secondary | ICD-10-CM | POA: Diagnosis present

## 2024-10-24 DIAGNOSIS — I4891 Unspecified atrial fibrillation: Secondary | ICD-10-CM | POA: Diagnosis not present

## 2024-10-24 DIAGNOSIS — L03031 Cellulitis of right toe: Secondary | ICD-10-CM

## 2024-10-24 DIAGNOSIS — I739 Peripheral vascular disease, unspecified: Secondary | ICD-10-CM

## 2024-10-24 DIAGNOSIS — N189 Chronic kidney disease, unspecified: Secondary | ICD-10-CM | POA: Diagnosis not present

## 2024-10-24 DIAGNOSIS — R600 Localized edema: Secondary | ICD-10-CM | POA: Diagnosis not present

## 2024-10-24 DIAGNOSIS — Z5329 Procedure and treatment not carried out because of patient's decision for other reasons: Secondary | ICD-10-CM | POA: Diagnosis not present

## 2024-10-24 DIAGNOSIS — I5032 Chronic diastolic (congestive) heart failure: Secondary | ICD-10-CM

## 2024-10-24 MED ORDER — DOXYCYCLINE HYCLATE 100 MG PO TABS: 100.0000 mg | ORAL_TABLET | Freq: Two times a day (BID) | ORAL | 0 refills | Status: DC

## 2024-10-24 MED ORDER — INSULIN GLARGINE 100 UNIT/ML ~~LOC~~ SOLN: 8.0000 [IU] | Freq: Every day | SUBCUTANEOUS | 3 refills | Status: DC

## 2024-10-24 NOTE — ED Notes (Signed)
 Patient refused his xray. Cursing in room and asking for ice chips only. Will notify provider.

## 2024-10-24 NOTE — ED Triage Notes (Signed)
 Reports great toe on R foot infected. Denies fevers.

## 2024-10-24 NOTE — Progress Notes (Signed)
 Provider: Roxan Plough FNP-C  Atha Mcbain, Roxan BROCKS, NP  Patient Care Team: Leelah Hanna, Roxan BROCKS, NP as PCP - General (Family Medicine) Bensimhon, Toribio SAUNDERS, MD as PCP - Advanced Heart Failure (Cardiology) Burnard Debby LABOR, MD (Inactive) as PCP - Cardiology (Cardiology) Burnard Debby LABOR, MD (Inactive) as Consulting Physician (Cardiology) Rolan Ezra RAMAN, MD as Consulting Physician (Cardiology) Pa, [facility] [place], [facility]  Extended Emergency Contact Information Primary Emergency Contact: [facility] Phone: [phone] Relation: Daughter Secondary Emergency Contact: McCarthy,Diane  [place] Mobile Phone: [phone] Relation: Other  Code Status:  Full Code  Goals of care: Advanced Directive information    10/24/2024    1:35 PM  Advanced Directives  Does Patient Have a Medical Advance Directive? No  Would patient like information on creating a medical advance directive? No - Patient declined     Chief Complaint  Patient presents with   Wound Check    HPI:  Pt is a 73 y.o. male seen today for an acute visit for evaluation of right great toe pain and infection for one week ago.Described as blood blister that unroofed.Daughter has been applying silver alginate and wrapping with gauze,foam dressing.He denies any fever,chills or drainage.He has a significant history of Peripheral vascular disease,Type 2 diabetes,Afib,CKD among others.   Daughter states patient was seen by another provider in the ED but left and preferred to be seen here by PCP.He does not want to be hospitalized.    Type 2 DM - does not checking his blood sugars at home.Has been out of his medication but missed his previous appointments since last seen August,2024. Has been  noncompliant with his medication.    Lower extremities  edema - he adjusted Lasix  to 60 then reduced down to 20 mg tablet daily.Has been elevating legs which has been effective.    Past Medical History:  Diagnosis Date   A-fib [facility])    a. (06/04/14) TEE-DC-CV; succesful; large LA 6.2 cm   ADHD    Ascites    Athlete's foot    Chronic systolic heart failure (HCC)    a. 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, unspecified CKD stage, unspecified whether long term insulin  use (HCC)    Heart murmur    HTN (hypertension)    OSA (obstructive sleep apnea)    PVD (peripheral vascular disease)    s/p great toe amputation   Past Surgical History:  Procedure Laterality Date   ABDOMINAL AORTOGRAM W/LOWER EXTREMITY N/A 05/19/2021   Procedure: ABDOMINAL AORTOGRAM W/LOWER EXTREMITY;  Surgeon: Gretta Lonni PARAS, MD;  Location: [facility];  Service: Cardiovascular;  Laterality: N/A;   ABDOMINAL AORTOGRAM W/LOWER EXTREMITY Right 06/11/2022   Procedure: ABDOMINAL AORTOGRAM W/LOWER EXTREMITY;  Surgeon: Gretta Lonni PARAS, MD;  Location: [facility];  Service: Cardiovascular;  Laterality: Right;   ABDOMINAL AORTOGRAM W/LOWER EXTREMITY Left 12/10/2022   Procedure: ABDOMINAL AORTOGRAM W/LOWER EXTREMITY;  Surgeon: Gretta Lonni PARAS, MD;  Location: [facility];  Service: Cardiovascular;  Laterality: Left;   ABDOMINAL AORTOGRAM W/LOWER EXTREMITY Right 01/06/2024   Procedure: ABDOMINAL AORTOGRAM W/LOWER EXTREMITY;  Surgeon: Gretta Lonni PARAS, MD;  Location: [facility];  Service: Cardiovascular;  Laterality: Right;   AMPUTATION Left 05/22/2021   Procedure: LEFT FIFTH  TOE AMPUTATION;  Surgeon: Gretta Lonni PARAS, MD;  Location: [facility] OR;  Service: Vascular;  Laterality: Left;   CARDIOVERSION N/A 06/04/2014   Procedure: CARDIOVERSION;  Surgeon: Ezra GORMAN Shuck, MD;  Location: [facility] ENDOSCOPY;  Service: Cardiovascular;  Laterality: N/A;   CARDIOVERSION N/A  11/06/2021   Procedure: CARDIOVERSION;  Surgeon: Shuck Ezra GORMAN, MD;  Location: [facility] ENDOSCOPY;  Service: Cardiovascular;  Laterality: N/A;   NOSE SURGERY     Nasal septum surgery   PERIPHERAL VASCULAR BALLOON ANGIOPLASTY  06/11/2022   Procedure: PERIPHERAL VASCULAR BALLOON ANGIOPLASTY;  Surgeon: Gretta Lonni PARAS, MD;  Location: [facility];  Service: Cardiovascular;;   PERIPHERAL VASCULAR BALLOON ANGIOPLASTY Left 12/10/2022   Procedure: PERIPHERAL VASCULAR BALLOON ANGIOPLASTY;  Surgeon: Gretta Lonni PARAS, MD;  Location: [facility];  Service: Cardiovascular;  Laterality: Left;  Peroneal   PERIPHERAL VASCULAR BALLOON ANGIOPLASTY  01/06/2024   Procedure: PERIPHERAL VASCULAR BALLOON ANGIOPLASTY;  Surgeon: Gretta Lonni PARAS, MD;  Location: [facility];  Service: Cardiovascular;;  PT,  Shockwave Popliteal   PERIPHERAL VASCULAR INTERVENTION Left 05/19/2021   Procedure: PERIPHERAL VASCULAR INTERVENTION;  Surgeon: Gretta Lonni PARAS, MD;  Location: [facility];  Service: Cardiovascular;  Laterality: Left;   TEE WITHOUT CARDIOVERSION N/A 06/04/2014   Procedure: TRANSESOPHAGEAL ECHOCARDIOGRAM (TEE);  Surgeon: Ezra GORMAN Shuck, MD;  Location: [facility] ENDOSCOPY;  Service: Cardiovascular;  Laterality: N/A;   TEE WITHOUT CARDIOVERSION N/A 01/06/2016   Procedure: TRANSESOPHAGEAL ECHOCARDIOGRAM (TEE);  Surgeon: Vina Okey GAILS, MD;  Location: [facility] ENDOSCOPY;  Service: Cardiovascular;  Laterality: N/A;   TEE WITHOUT CARDIOVERSION N/A 11/06/2021   Procedure: TRANSESOPHAGEAL ECHOCARDIOGRAM (TEE);  Surgeon: Shuck Ezra GORMAN, MD;  Location: [facility] ENDOSCOPY;  Service: Cardiovascular;  Laterality: N/A;   VASECTOMY      Allergies[1]  Outpatient Encounter Medications as of 10/24/2024  Medication Sig   clopidogrel  (PLAVIX ) 75 MG tablet TAKE 1 TABLET(75 MG) BY MOUTH DAILY   empagliflozin  (JARDIANCE ) 10 MG TABS tablet Take 1 tablet (10 mg total) by mouth daily.   furosemide  (LASIX ) 40 MG tablet  Patient takes 1 tablet by mouth Daily in the morning.   metoprolol  succinate (TOPROL -XL) 50 MG 24 hr tablet TAKE 1 TABLET(50 MG) BY MOUTH DAILY WITH OR IMMEDIATELY FOLLOWING A MEAL   spironolactone  (ALDACTONE ) 25 MG tablet TAKE 1 TABLET(25 MG) BY MOUTH DAILY   warfarin (COUMADIN ) 6 MG tablet Take 1-2 tablets daily or as prescribed by Coumadin  Clinic   Accu-Chek Softclix Lancets lancets 1 each by Other route daily. As instructed E11.22 (Patient not taking: Reported on 10/24/2024)   Blood Glucose Monitoring Suppl (ACCU-CHEK AVIVA PLUS) w/Device KIT 1 Device by Does not apply route daily. E11.22 (Patient not taking: Reported on 10/24/2024)   diclofenac  Sodium (VOLTAREN ) 1 % GEL Apply 1 Application topically 4 (four) times daily as needed (pain). (Patient not taking: Reported on 10/24/2024)   glucose blood (ACCU-CHEK AVIVA PLUS) test strip 1 each by Other route daily. As instructed E11.22 (Patient not taking: Reported on 10/24/2024)   insulin  glargine (LANTUS ) 100 UNIT/ML injection INJECT 8 UNITS UNDER THE SKIN ONCE DAILY AS DIRECTED (Patient not taking: Reported on 10/24/2024)   Insulin  Syringe-Needle U-100 (TRUEPLUS INSULIN  SYRINGE) 30G X 5/16 1 ML MISC USE ONCE DAILY FOR INSULIN  INJECTIONS. (Patient not taking: Reported on 10/24/2024)   Misc Natural Products (JOINT SUPPORT PO) Take 1 Dose by mouth 3 (three) times a week. (Patient not taking: Reported on 10/24/2024)   thiamine (VITAMIN B-1) 100 MG tablet Take 100  mg by mouth daily. (Patient not taking: Reported on 10/24/2024)   No facility-administered encounter medications on file as of 10/24/2024.    Review of Systems  Constitutional:  Negative for appetite change, chills, fatigue, fever and unexpected weight change.  HENT:  Negative for congestion, dental problem, ear discharge, ear pain, hearing loss, nosebleeds, postnasal drip, rhinorrhea, sinus pressure, sinus pain, sneezing, sore throat, tinnitus and trouble swallowing.   Eyes:  Positive for  visual disturbance. Negative for pain, discharge, redness and itching.  Respiratory:  Negative for cough, chest tightness, shortness of breath and wheezing.   Cardiovascular:  Negative for chest pain, palpitations and leg swelling.  Gastrointestinal:  Negative for abdominal distention, abdominal pain, blood in stool, constipation, diarrhea, nausea and vomiting.  Endocrine: Negative for cold intolerance, heat intolerance, polydipsia, polyphagia and polyuria.  Genitourinary:  Negative for difficulty urinating, dysuria, flank pain, frequency and urgency.  Musculoskeletal:  Negative for arthralgias, back pain, gait problem, joint swelling, myalgias, neck pain and neck stiffness.  Skin:  Positive for wound. Negative for color change, pallor and rash.       Right great toe wound   Neurological:  Negative for dizziness, syncope, speech difficulty, weakness, light-headedness, numbness and headaches.  Hematological:  Does not bruise/bleed easily.  Psychiatric/Behavioral:  Negative for agitation, behavioral problems, confusion, hallucinations, self-injury, sleep disturbance and suicidal ideas. The patient is not nervous/anxious.     Immunization History  Administered Date(s) Administered   Tdap 05/01/2016   Pertinent  Health Maintenance Due  Topic Date Due   FOOT EXAM  Never done   OPHTHALMOLOGY EXAM  11/26/2020   HEMOGLOBIN A1C  12/31/2023   Influenza Vaccine  03/20/2025 (Originally 06/09/2024)   Colonoscopy  Discontinued      08/01/2022    6:45 PM 02/25/2023    2:33 PM 05/20/2023    1:56 PM 06/30/2023    1:41 PM 10/24/2024    1:34 PM  Fall Risk  Falls in the past year?  0 0 0 0  Was there an injury with Fall?   0  0  0  Fall Risk Category Calculator   0 0 0  (RETIRED) Patient Fall Risk Level High fall risk       Patient at Risk for Falls Due to  No Fall Risks No Fall Risks No Fall Risks No Fall Risks  Fall risk Follow up  Falls evaluation completed Falls evaluation completed Falls evaluation  completed Falls evaluation completed     Data saved with a previous flowsheet row definition   Functional Status Survey:    Vitals:   10/24/24 1343  BP: 136/88  Pulse: 100  Resp: 20  Temp: 97.8 F (36.6 C)  SpO2: 94%  Weight: 229 lb (103.9 kg)  Height: 5' 8 (1.727 m)   Body mass index is 34.82 kg/m. Physical Exam Vitals reviewed.  Constitutional:      General: He is not in acute distress.    Appearance: Normal appearance. He is normal weight. He is not ill-appearing or diaphoretic.  HENT:     Head: Normocephalic.     Right Ear: Tympanic membrane, ear canal and external ear normal. There is no impacted cerumen.     Left Ear: Tympanic membrane, ear canal and external ear normal. There is no impacted cerumen.     Nose: Nose normal. No congestion or rhinorrhea.     Mouth/Throat:     Mouth: Mucous membranes are moist.     Pharynx: Oropharynx is clear. No oropharyngeal exudate  or posterior oropharyngeal erythema.  Eyes:     General: No scleral icterus.       Right eye: No discharge.        Left eye: No discharge.     Extraocular Movements: Extraocular movements intact.     Conjunctiva/sclera: Conjunctivae normal.     Pupils: Pupils are equal, round, and reactive to light.  Neck:     Vascular: No carotid bruit.  Cardiovascular:     Rate and Rhythm: Normal rate and regular rhythm.     Pulses: Normal pulses.     Heart sounds: Normal heart sounds. No murmur heard.    No friction rub. No gallop.  Pulmonary:     Effort: Pulmonary effort is normal. No respiratory distress.     Breath sounds: Normal breath sounds. No wheezing, rhonchi or rales.  Chest:     Chest wall: No tenderness.  Abdominal:     General: Bowel sounds are normal. There is no distension.     Palpations: Abdomen is soft. There is no mass.     Tenderness: There is no abdominal tenderness. There is no right CVA tenderness, left CVA tenderness, guarding or rebound.  Musculoskeletal:        General: No  swelling or tenderness. Normal range of motion.     Cervical back: Normal range of motion. No rigidity or tenderness.     Right lower leg: 1+ Edema present.     Left lower leg: 1+ Edema present.     Comments: Left hand missing fingers  Venous stasis   Lymphadenopathy:     Cervical: No cervical adenopathy.  Skin:    General: Skin is warm and dry.     Coloration: Skin is not pale.     Findings: No bruising, lesion or rash.     Comments: Left great toe superficial wound extending from base of toenail cuticle area down to mid-toe with erythema noted.wound bed red and tender to touch.No drainage noted. Bilateral lower extremities dry flaky skin noted. Wound bed cleansed with saline ,pat dry,covered with 4 X 4 Xerofoam and foam dressing for extra protection and absorption secured with 4 X 4 gauze a paper tape due to position of wound.  Neurological:     Mental Status: He is alert and oriented to person, place, and time.     Cranial Nerves: No cranial nerve deficit.     Sensory: No sensory deficit.     Motor: No weakness.     Coordination: Coordination normal.     Gait: Gait normal.  Psychiatric:        Mood and Affect: Affect is labile.        Speech: Speech normal.        Behavior: Behavior is agitated.        Thought Content: Thought content normal.        Judgment: Judgment normal.     Labs reviewed: Recent Labs    01/24/24 1156 07/25/24 1247 08/14/24 1518  NA 140 138 137  K 4.5 5.0 4.8  CL 108 104 100  CO2 25 27 27   GLUCOSE 166* 139* 154*  BUN 32* 36* 52*  CREATININE 1.75* 1.62* 2.39*  CALCIUM  9.5 9.6 8.9   No results for input(s): AST, ALT, ALKPHOS, BILITOT, PROT, ALBUMIN in the last 8760 hours. Recent Labs    01/06/24 0617  HGB 11.6*  HCT 34.0*   Lab Results  Component Value Date   TSH 2.19 06/30/2023   Lab Results  Component Value Date   HGBA1C 6.2 (H) 06/30/2023   Lab Results  Component Value Date   CHOL 187 06/30/2023   HDL 37 (L)  06/30/2023   LDLCALC 119 (H) 06/30/2023   TRIG 184 (H) 06/30/2023   CHOLHDL 5.1 (H) 06/30/2023    Significant Diagnostic Results in last 30 days:  No results found.  Assessment/Plan 1. Type 2 diabetes mellitus with chronic kidney disease, without long-term current use of insulin , unspecified CKD stage (HCC) Non-compliant with medication and missed several appointments  Agrees to restart on Lantus  as below  - continue on Jardiance   - insulin  glargine (LANTUS ) 100 UNIT/ML injection; Inject 0.08 mLs (8 Units total) into the skin daily.  Dispense: 10 mL; Refill: 3 - Lipid panel - TSH - CBC with Differential/Platelet - Hemoglobin A1c - Complete Metabolic Panel with eGFR  2. Chronic systolic heart failure (HCC) (Primary) Bilateral lower extremities 1 + edema  - continue on Furosemide  adjusted own dosage from 40 to 60 mg tablet then to 20 mg tablet as reported by daughter. Continue on Spironolactone  and metoprolol  - Encouraged to elevate lower extremities  - CBC with Differential/Platelet - Complete Metabolic Panel with eGFR  3. Hypertensive heart and kidney disease with chronic diastolic congestive heart failure and stage 3 chronic kidney disease, unspecified whether stage 3a or 3b CKD (HCC) - B/p stable  - continue on Spironolactone ,metoprolol  and Furosemide   - TSH - CBC with Differential/Platelet - Complete Metabolic Panel with eGFR  4. PVD (peripheral vascular disease) Chronic  - right great toe wound without any necrosis   5. Cellulitis of great toe of right foot Afebrile  Dorsal superficial wound which started as a blood blister that unroofed. Wound bed cleansed with saline ,pat dry,covered with 4 X 4 Xerofoam and foam dressing for extra protection and absorption secured with 4 X 4 gauze a paper tape due to position of wound. Recommend MRI to evaluate infection due to significant history of Peripheral vascular disease but patient strongly disagreed stated waste of money  doing scans. - doxycycline  (VIBRA -TABS) 100 MG tablet; Take 1 tablet (100 mg total) by mouth 2 (two) times daily for 7 days.  Dispense: 14 tablet; Refill: 0 - Daughter will notify coumadin  clinic to adjust warfarin while on doxycyline  -Declined hospitalization was in ED prior to coming to PCP office. - will follow up in 1 week  - advised to go to ED if symptoms worsen or running any fever,chills,odor or drainage. - Has upcoming appointment for wound care in January,2025   Family/ staff Communication: Reviewed plan of care with patient and daughter verbalized understanding   Labs/tests ordered:  - CBC with Differential/Platelet - CMP with eGFR([facility]) - TSH - Hgb A1C - Lipid panel   Next Appointment: Return in about 6 months (around 04/24/2025) for medical mangement of chronic issues.one week for right great toe wound .  Total time: 30 minutes.Greater than 50% of total time spent doing patient education regarding T2DM,CHF,HTN, PVD,CKD stage 3,right foot cellulitis and health maintenance including symptom/medication management.   Roxan BROCKS Marwan Lipe, NP     [1] No Known Allergies

## 2024-10-24 NOTE — Patient Instructions (Signed)
 Continue to cleanse right great toe with saline ,pat dry, apply silver alginate to open wound and covered with foam dressing for extra protection and absorption.change dressing.

## 2024-10-24 NOTE — ED Provider Notes (Signed)
 [place] EMERGENCY DEPARTMENT AT [facility] Provider Note   CSN: [id number] Arrival date & time: 10/24/24  1132     Patient presents with: Toe Injury   [name] is a 73 y.o. male.   This is a 73 year old male presenting emergency department with toe infection/pain.  Complex past medical history to include morbid obesity, A-fib, CKD peripheral vascular disease.  Reports had a blister to the toe for a few days that unroofed and has gradually become more painful and red.  Denies fevers or chills.  Daughter at bedside notes that he has stopped taking insulin         Prior to Admission medications  Medication Sig Start Date End Date Taking? Authorizing Provider  Accu-Chek Softclix Lancets lancets 1 each by Other route daily. As instructed E11.22 06/02/23   Ngetich, Dinah C, NP  Blood Glucose Monitoring Suppl (ACCU-CHEK AVIVA PLUS) w/Device KIT 1 Device by Does not apply route daily. E11.22 06/30/23   Ngetich, Dinah C, NP  clopidogrel  (PLAVIX ) 75 MG tablet TAKE 1 TABLET(75 MG) BY MOUTH DAILY 05/23/24   Gretta Lonni PARAS, MD  diclofenac  Sodium (VOLTAREN ) 1 % GEL Apply 1 Application topically 4 (four) times daily as needed (pain). Patient not taking: Reported on 08/14/2024    [provider]  empagliflozin  (JARDIANCE ) 10 MG TABS tablet Take 1 tablet (10 mg total) by mouth daily. 08/14/24   Milford, Harlene HERO, FNP  furosemide  (LASIX ) 40 MG tablet Patient takes 1 tablet by mouth Daily in the morning. 08/21/24   Bensimhon, Toribio SAUNDERS, MD  glucose blood (ACCU-CHEK AVIVA PLUS) test strip 1 each by Other route daily. As instructed E11.22 Patient not taking: Reported on 08/14/2024 06/02/23   Ngetich, Dinah C, NP  insulin  glargine (LANTUS ) 100 UNIT/ML injection INJECT 8 UNITS UNDER THE SKIN ONCE DAILY AS DIRECTED Patient not taking: Reported on 08/14/2024 07/24/24   Ngetich, Dinah C, NP  Insulin  Syringe-Needle U-100 (TRUEPLUS INSULIN  SYRINGE) 30G X 5/16 1 ML MISC USE ONCE  DAILY FOR INSULIN  INJECTIONS. Patient not taking: Reported on 08/14/2024 02/25/23   Ngetich, Dinah C, NP  metoprolol  succinate (TOPROL -XL) 50 MG 24 hr tablet TAKE 1 TABLET(50 MG) BY MOUTH DAILY WITH OR IMMEDIATELY FOLLOWING A MEAL 09/11/24   Milford, Brooks, FNP  Misc Natural Products (JOINT SUPPORT PO) Take 1 Dose by mouth 3 (three) times a week. Patient not taking: Reported on 08/14/2024    [provider]  spironolactone  (ALDACTONE ) 25 MG tablet TAKE 1 TABLET(25 MG) BY MOUTH DAILY 12/14/23   Marcine Catalan M, PA-C  thiamine (VITAMIN B-1) 100 MG tablet Take 100 mg by mouth daily. Patient not taking: Reported on 08/14/2024    [provider]  warfarin (COUMADIN ) 6 MG tablet Take 1-2 tablets daily or as prescribed by Coumadin  Clinic 09/04/24   Bensimhon, Toribio SAUNDERS, MD    Allergies: Patient has no known allergies.    Review of Systems  Updated Vital Signs BP (!) 138/93 (BP Location: Right Arm)   Pulse 88   Temp 98.9 F (37.2 C) (Oral)   Resp 18   SpO2 99%   Physical Exam Vitals and nursing note reviewed.  Constitutional:      General: He is not in acute distress.    Appearance: He is obese. He is not toxic-appearing.  HENT:     Head: Normocephalic.     Mouth/Throat:     Mouth: Mucous membranes are moist.  Eyes:     Conjunctiva/sclera: Conjunctivae normal.  Cardiovascular:  Rate and Rhythm: Normal rate.  Pulmonary:     Effort: Pulmonary effort is normal.  Musculoskeletal:     Comments: Bilateral lower extremity edema with dry flaking skin.  Venous stasis bilaterally.  His right great toe with erythema, skin sloughing, with redness tracking up the midfoot.  Skin:    General: Skin is warm and dry.     Capillary Refill: Capillary refill takes less than 2 seconds.  Neurological:     Mental Status: He is alert and oriented to person, place, and time.  Psychiatric:        Mood and Affect: Mood normal.        Behavior: Behavior normal.     (all labs  ordered are listed, but only abnormal results are displayed) Labs Reviewed  CULTURE, BLOOD (ROUTINE X 2)  CULTURE, BLOOD (ROUTINE X 2)  CBC  BASIC METABOLIC PANEL WITH GFR  LACTIC ACID, PLASMA  LACTIC ACID, PLASMA    EKG: None  Radiology: No results found.   Procedures   Medications Ordered in the ED - No data to display  Clinical Course as of 10/24/24 1251  Tue Oct 24, 2024  1248 Patient will be leaving AGAINST MEDICAL ADVICE at this time.  Was hesitant to get labs and imaging to start with.  He has changed his mind and is refusing care.  I did discuss my concern that his toe is infected and needs antibiotics and hospitalization.  I am concerned for possible osteomyelitis.  Patient is clinically sober and has capacity my clinical opinion.  He was able to voice understanding of the risks of leaving.  As a next step he is going to follow-up with his primary doctor.  He was informed that we would resume care if he changes his mind and would like to come back to the emergency department.  Patient will be leaving AGAINST MEDICAL ADVICE at this time. [TY]    Clinical Course User Index [TY] Neysa Caron PARAS, DO                                 Medical Decision Making This is a 73 year old male complex past medical history to A-fib CKD obesity diabetes peripheral vascular disease medication noncompliance presenting the emergency department with toe pain.  Concern for infection.  Vital signs overall reassuring however.  Patient's daughter at bedside notes that he has been off of his insulin  for several months and has taken himself off.  He is requesting debridement and referral to wound care.  I expressed my concern that this is likely bacterial and he needs antibiotics.  Patient hesitant, but agreeable to labs and imaging at this time.  Amount and/or Complexity of Data Reviewed Labs: ordered. Radiology: ordered.       Final diagnoses:  None    ED Discharge Orders     None           Neysa Caron PARAS, DO 10/24/24 1251

## 2024-10-24 NOTE — ED Notes (Signed)
 Patient refused bloodwork. Provider notified. Dr. Neysa spoke with patient. AMA signed. Refused dressing. Patient taken out of unit and to vehicle per wheelchair by his daughter.

## 2024-10-25 LAB — COMPLETE METABOLIC PANEL WITHOUT GFR
AG Ratio: 1.1 (calc) (ref 1.0–2.5)
ALT: 38 U/L (ref 9–46)
AST: 26 U/L (ref 10–35)
Albumin: 4.1 g/dL (ref 3.6–5.1)
Alkaline phosphatase (APISO): 40 U/L (ref 35–144)
BUN/Creatinine Ratio: 24 (calc) — ABNORMAL HIGH (ref 6–22)
BUN: 40 mg/dL — ABNORMAL HIGH (ref 7–25)
CO2: 29 mmol/L (ref 20–32)
Calcium: 9.1 mg/dL (ref 8.6–10.3)
Chloride: 101 mmol/L (ref 98–110)
Creat: 1.66 mg/dL — ABNORMAL HIGH (ref 0.70–1.28)
Globulin: 3.7 g/dL (ref 1.9–3.7)
Glucose, Bld: 146 mg/dL — ABNORMAL HIGH (ref 65–139)
Potassium: 4.6 mmol/L (ref 3.5–5.3)
Sodium: 140 mmol/L (ref 135–146)
Total Bilirubin: 1.1 mg/dL (ref 0.2–1.2)
Total Protein: 7.8 g/dL (ref 6.1–8.1)

## 2024-10-25 LAB — CBC WITH DIFFERENTIAL/PLATELET
Absolute Lymphocytes: 1066 {cells}/uL (ref 850–3900)
Absolute Monocytes: 800 {cells}/uL (ref 200–950)
Basophils Absolute: 52 {cells}/uL (ref 0–200)
Basophils Relative: 0.6 %
Eosinophils Absolute: 224 {cells}/uL (ref 15–500)
Eosinophils Relative: 2.6 %
HCT: 40.3 % (ref 39.4–51.1)
Hemoglobin: 12.5 g/dL — ABNORMAL LOW (ref 13.2–17.1)
MCH: 28.1 pg (ref 27.0–33.0)
MCHC: 31 g/dL — ABNORMAL LOW (ref 31.6–35.4)
MCV: 90.6 fL (ref 81.4–101.7)
MPV: 11.5 fL (ref 7.5–12.5)
Monocytes Relative: 9.3 %
Neutro Abs: 6459 {cells}/uL (ref 1500–7800)
Neutrophils Relative %: 75.1 %
Platelets: 217 Thousand/uL (ref 140–400)
RBC: 4.45 Million/uL (ref 4.20–5.80)
RDW: 14.1 % (ref 11.0–15.0)
Total Lymphocyte: 12.4 %
WBC: 8.6 Thousand/uL (ref 3.8–10.8)

## 2024-10-25 LAB — HEMOGLOBIN A1C
Hgb A1c MFr Bld: 7.5 % — ABNORMAL HIGH (ref <5.7)
Mean Plasma Glucose: 169 mg/dL
eAG (mmol/L): 9.3 mmol/L

## 2024-10-25 LAB — LIPID PANEL
Cholesterol: 122 mg/dL (ref <200)
HDL: 25 mg/dL — ABNORMAL LOW (ref >=40)
LDL Cholesterol (Calc): 78 mg/dL
Non-HDL Cholesterol (Calc): 97 mg/dL (ref <130)
Total CHOL/HDL Ratio: 4.9 (calc) (ref <5.0)
Triglycerides: 103 mg/dL (ref <150)

## 2024-10-25 LAB — TSH: TSH: 1.07 m[IU]/L (ref 0.40–4.50)

## 2024-10-26 ENCOUNTER — Telehealth: Payer: Self-pay

## 2024-10-26 MED ORDER — SPIRONOLACTONE 25 MG PO TABS: 25.0000 mg | ORAL_TABLET | Freq: Every day | ORAL | 1 refills | Status: AC

## 2024-10-26 NOTE — Telephone Encounter (Signed)
 Patient called in inquiring about Rx Doxycyline ,pt stated that pharmacy sed they didn't have rx. A call was placed to pharmacy about rx,spoke with pharmacist she stated that they were in process of filling rx.The pharmacy didn't have medication in when patient came to pick up rx. A call was placed to patient there was no answer ,I left a voicemail informing patient staus of rx.

## 2024-10-30 NOTE — Telephone Encounter (Signed)
 Called the patient's daughter. I advised Dinah had an opening for the afternoon if he would rather see Dinah tomorrow. She declined, stating it's best he come early morning because she might need to take him to ED. Stated the patients toe is yellow; he isnt feeling good, and she may end up taking him to the ED. I advised he should speak with CI for clinical advice. She declined.

## 2024-10-31 ENCOUNTER — Ambulatory Visit

## 2024-10-31 ENCOUNTER — Ambulatory Visit: Admitting: Adult Health

## 2024-10-31 ENCOUNTER — Other Ambulatory Visit (HOSPITAL_COMMUNITY): Payer: Self-pay

## 2024-10-31 ENCOUNTER — Encounter: Payer: Self-pay | Admitting: Adult Health

## 2024-10-31 ENCOUNTER — Telehealth: Payer: Self-pay

## 2024-10-31 VITALS — Ht 68.0 in | Wt 229.0 lb

## 2024-10-31 VITALS — HR 91 | Temp 96.8°F

## 2024-10-31 DIAGNOSIS — L97512 Non-pressure chronic ulcer of other part of right foot with fat layer exposed: Secondary | ICD-10-CM

## 2024-10-31 DIAGNOSIS — I739 Peripheral vascular disease, unspecified: Secondary | ICD-10-CM

## 2024-10-31 DIAGNOSIS — L03031 Cellulitis of right toe: Secondary | ICD-10-CM

## 2024-10-31 DIAGNOSIS — E1122 Type 2 diabetes mellitus with diabetic chronic kidney disease: Secondary | ICD-10-CM | POA: Diagnosis not present

## 2024-10-31 DIAGNOSIS — L97511 Non-pressure chronic ulcer of other part of right foot limited to breakdown of skin: Secondary | ICD-10-CM | POA: Diagnosis not present

## 2024-10-31 DIAGNOSIS — I5022 Chronic systolic (congestive) heart failure: Secondary | ICD-10-CM | POA: Diagnosis not present

## 2024-10-31 DIAGNOSIS — I4891 Unspecified atrial fibrillation: Secondary | ICD-10-CM | POA: Diagnosis not present

## 2024-10-31 DIAGNOSIS — Z794 Long term (current) use of insulin: Secondary | ICD-10-CM

## 2024-10-31 MED ORDER — INSULIN GLARGINE 100 UNIT/ML ~~LOC~~ SOLN
8.0000 [IU] | Freq: Every day | SUBCUTANEOUS | 2 refills | Status: AC
  Filled 2024-10-31: qty 10, 28d supply, fill #0

## 2024-10-31 MED ORDER — DOXYCYCLINE HYCLATE 100 MG PO TABS
100.0000 mg | ORAL_TABLET | Freq: Two times a day (BID) | ORAL | 0 refills | Status: DC
  Filled 2024-10-31: qty 14, 7d supply, fill #0

## 2024-10-31 MED ORDER — DOXYCYCLINE HYCLATE 100 MG PO TABS: 100.0000 mg | ORAL_TABLET | Freq: Two times a day (BID) | ORAL | 0 refills | Status: AC

## 2024-10-31 NOTE — Progress Notes (Signed)
 "    Subjective:  Patient ID: [name], male    DOB: 1951/05/12,  MRN: [id number]  Chief Complaint  Patient presents with   Foot Ulcer    Patient is here for right toe cellulitis and edema as well as entire RLE    Discussed the use of AI scribe software for clinical note transcription with the patient, who gave verbal consent to proceed.  History of Present Illness [name] is a 73 year old male with chronic venous insufficiency and prior right Achilles tendon removal who presents with a right great toe dorsal wound with persistent drainage and cellulitis.  Three to four weeks ago, he developed a blood-filled blister on the dorsal aspect of the right great toe after walking barefoot, which became swollen and ruptured, leaving a persistent wound.  Since rupture, he has done daily dressing changes at home, now using Betadine, foam, and alginate dressings. The wound has daily serous drainage, which increased with use of xeroform. He notes macerated periwound skin but no malodor or necrosis, and he feels this is different from his prior bone infection.  He started doxycycline  five days ago with two days left in the current course and another seven days planned. He has upcoming wound care and vascular evaluations.  He has chronic bilateral lower extremity edema and has wrapped his legs daily for two and a half years for chronic wounds and swelling. He has not taken insulin  or exercised for four months. He has prior osteomyelitis of the opposite foot.  He has an upcoming appointment with wound care 11/17/24.      Review of Systems: Negative except as noted in the HPI. Denies N/V/F/Ch.  Past Medical History:  Diagnosis Date   A-fib [facility])    a. (06/04/14) TEE-DC-CV; succesful; large LA 6.2 cm   ADHD    Ascites    Athlete's foot    Chronic systolic heart failure (HCC)    a. 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 Well nourished. Oriented to person, place, and time.  Vascular Dorsalis pedis pulses non-palpable bilaterally. Posterior tibial pulses non-palpable bilaterally. Capillary refill normal to all digits.  No cyanosis or clubbing noted. Pedal hair growth normal.  Neurologic Normal speech. Epicritic sensation to light touch grossly diminished bilaterally. Negative tinel sign at tarsal tunnel bilaterally.   Dermatologic Skin texture and turgor are within normal limits.  Right hallux dorsal foot wound with mixed granular and fibrotic-slough base. Moderate serous drainage. Periwound maceration. Erythema present to forefoot. No edema, malodor, or purulent drainage consistent with infection. No deep areas of extension.   Musculoskeletal: No contributing deformity.    Right lower extremity arterial duplex 02/01/24: Summary:  Right: Patent lower extremity arterial system without evidence of  hemodynamically significant stenosis.   Radiographs: Taken and reviewed 3 views right foot taken. Arthritis present at 1st MTP with decreased joint space and subchondral sclerosis. No frank cortical erosions, periosteal reactions to suggest  osteomyelitis to the right hallux. No other acute osseous findings or abnormalities.        Assessment:   1. Cellulitis of right toe      Plan:  Patient was evaluated and treated and all questions answered.  Assessment and Plan Assessment & Plan Right great toe wound with cellulitis Right great toe  dorsal wound with cellulitis post blood blister rupture. Persistent serous drainage and periwound maceration due to excessive moisture. No necrosis, malodor, or osteomyelitis identified. Management focuses on wound dryness. Betadine preferred for desiccation. Antibiotics for cellulitis. - Continue doxycycline  to completion. - Perform Betadine dressing changes at least once daily; increase to twice daily if dressings are saturated or with frequent standing. Betadine WTD applied today.  - Monitor for increased drainage, infection, or wound deterioration and report if present. - Return for follow-up in 1 weeks. Keep existing appointment with wound care.  - Ordered ABI to assess lower extremity perfusion.  Chronic venous insufficiency and PAD Chronic venous insufficiency causing persistent bilateral lower extremity edema and delayed wound healing. Last ABI in March was normal. Vascular evaluation scheduled at the beginning of the year. Has extensive history of PAD with multiple ABI results, last being in March. - Ordered repeat ABI to reassess lower extremity perfusion. - Continue current wound and leg care regimen. - Follow up with vascular specialist as scheduled at the beginning of the year.   Prentice Ovens, DPM AACFAS Fellowship Trained Podiatric Surgeon [facility]     [1]  Current Outpatient Medications:    clopidogrel  (PLAVIX ) 75 MG tablet, TAKE 1 TABLET(75 MG) BY MOUTH DAILY, Disp: 30 tablet, Rfl: 11   Digestive Enzymes (LIPASE CONCENTRATE-HP) 55.5 MG CAPS, Take by mouth., Disp: , Rfl:    doxycycline  (VIBRA -TABS) 100 MG tablet, Take 1 tablet (100 mg total) by mouth 2 (two) times daily for 7 days., Disp: 14 tablet, Rfl: 0   empagliflozin  (JARDIANCE ) 10 MG TABS tablet, Take 1 tablet (10 mg total) by mouth daily., Disp: 90 tablet, Rfl: 3   furosemide  (LASIX ) 40 MG tablet, Patient takes 1 tablet by mouth Daily in the morning., Disp: 30 tablet, Rfl: 5   insulin  glargine  (LANTUS ) 100 UNIT/ML injection, Inject 0.08 mLs (8 Units total) into the skin daily., Disp: 10 mL, Rfl: 2   metoprolol  succinate (TOPROL -XL) 50 MG 24 hr tablet, TAKE 1 TABLET(50 MG) BY MOUTH DAILY WITH OR IMMEDIATELY FOLLOWING A MEAL, Disp: 30 tablet, Rfl: 3   spironolactone  (ALDACTONE ) 25 MG tablet, Take 1 tablet (25 mg total) by mouth daily., Disp: 90 tablet, Rfl: 1   warfarin (COUMADIN ) 6 MG tablet, Take 1-2 tablets daily or as prescribed by Coumadin  Clinic, Disp: 30 tablet, Rfl: 2 [2]  Social History Tobacco Use  Smoking Status Some Days   Current packs/day: 0.00   Types: Cigarettes, Cigars   Start date: 05/1969   Last attempt to quit: 05/2021   Years since quitting: 3.4   Passive exposure: Never  Smokeless Tobacco Never  [3] No Known Allergies  "

## 2024-10-31 NOTE — Telephone Encounter (Signed)
 Copied from CRM #[id number]. Topic: Clinical - Prescription Issue >> Oct 31, 2024 12:40 PM Suzette B wrote: Reason for CRM: doxycycline  (VIBRA -TABS) 100 MG tablet  [id number] - [place], [place] - [address] AT [facility] [address]  Phone: [phone] Fax: [phone]  Pt daughter is calling to request the medication listed above is sent over to the [facility] listed she stated the [facility] didn't have any in stock and need it resent to the [facility] so her father can start on the medication as direction.   Please call daughter at [phone] to advise.

## 2024-10-31 NOTE — Telephone Encounter (Signed)
 RX did not transmit electronically. RX called in.

## 2024-10-31 NOTE — Telephone Encounter (Signed)
 RX sent, left message informing daughter

## 2024-10-31 NOTE — Progress Notes (Signed)
 "  [facility]  Provider:  Jereld Serum DNP  Code Status: Full Code  Goals of Care:     10/24/2024    1:35 PM  Advanced Directives  Does Patient Have a Medical Advance Directive? No  Would patient like information on creating a medical advance directive? No - Patient declined     Chief Complaint  Patient presents with   Medical Management of Chronic Issues    One week follow up of right great toe. Daughter states that he has been complaining of chills     Discussed the use of AI scribe software for clinical note transcription with the patient, who gave verbal consent to proceed.  HPI: Patient is a 73 y.o. male seen today for a 1-week follow up of right great toe cellulitis.  He is accompanied by his daughter.  He has a right toe wound present for approximately three weeks, initially appearing as a blood blister that led to skin loss. He is concerned about possibly losing his toe. He started doxycycline  100 mg twice daily two days after returning from vacation and has been on it for seven days. He has a history of a bone infection in the other foot two years ago, which required strong antibiotics and wound care.  He experiences significant leg swelling and cold chills over the past month. He has not taken insulin  for over three and a half months due to pharmacy shortages, which may be contributing to his symptoms. His last A1c was 7.5, and his blood sugar was 213 today at the clinic. He is currently on Jardiance  10 mg daily but has not been able to take Lantus  due to availability issues.  He has a history of chronic systolic heart failure and is currently taking spironolactone  25 mg daily, Jardiance  10 mg daily and Lasix  40 mg in the morning. No current shortness of breath. He also has chronic atrial fibrillation, for which he takes Coumadin  for anticoagulation and metoprolol  succinate XL 50 mg daily for rate control.  He has peripheral vascular disease and takes Plavix  75 mg  daily. He reports visual impairment, particularly in the right eye, following previous surgeries and treatments for eye conditions. He can see with his left eye but has difficulty with depth perception and identifying objects with his right eye.     Past Medical History:  Diagnosis Date   A-fib [facility])    a. (06/04/14) TEE-DC-CV; succesful; large LA 6.2 cm   ADHD    Ascites    Athlete's foot    Chronic systolic heart failure (HCC)    a. 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, unspecified CKD stage, unspecified whether long term insulin  use (HCC)    Heart murmur    HTN (hypertension)    OSA (obstructive sleep apnea)    PVD (peripheral vascular disease)    s/p great toe amputation    Past Surgical History:  Procedure Laterality Date   ABDOMINAL AORTOGRAM W/LOWER EXTREMITY N/A 05/19/2021   Procedure: ABDOMINAL AORTOGRAM W/LOWER EXTREMITY;  Surgeon: Gretta Lonni PARAS, MD;  [facility];  Service: Cardiovascular;  Laterality: N/A;   ABDOMINAL AORTOGRAM W/LOWER EXTREMITY Right 06/11/2022   Procedure: ABDOMINAL AORTOGRAM W/LOWER EXTREMITY;  Surgeon: Gretta Lonni PARAS, MD;  Location: [facility];  Service: Cardiovascular;  Laterality: Right;   ABDOMINAL AORTOGRAM W/LOWER EXTREMITY Left 12/10/2022   Procedure: ABDOMINAL AORTOGRAM W/LOWER EXTREMITY;  Surgeon: Gretta Lonni PARAS, MD;  Location: [facility];  Service: Cardiovascular;  Laterality: Left;   ABDOMINAL AORTOGRAM W/LOWER EXTREMITY Right 01/06/2024   Procedure: ABDOMINAL AORTOGRAM W/LOWER EXTREMITY;  Surgeon: Gretta Lonni PARAS, MD;  Location: [facility];  Service: Cardiovascular;  Laterality: Right;   AMPUTATION Left 05/22/2021   Procedure: LEFT FIFTH TOE AMPUTATION;  Surgeon: Gretta Lonni PARAS, MD;  Location: [facility] OR;  Service: Vascular;  Laterality: Left;   CARDIOVERSION N/A 06/04/2014   Procedure: CARDIOVERSION;  Surgeon: Ezra GORMAN Shuck, MD;  Location: [facility] ENDOSCOPY;  Service: Cardiovascular;  Laterality: N/A;   CARDIOVERSION N/A 11/06/2021   Procedure: CARDIOVERSION;  Surgeon: Shuck Ezra GORMAN, MD;  Location: [facility] ENDOSCOPY;  Service: Cardiovascular;  Laterality: N/A;   NOSE SURGERY     Nasal septum surgery   PERIPHERAL VASCULAR BALLOON ANGIOPLASTY  06/11/2022   Procedure: PERIPHERAL VASCULAR BALLOON ANGIOPLASTY;  Surgeon: Gretta Lonni PARAS, MD;  [facility];  Service: Cardiovascular;;   PERIPHERAL VASCULAR BALLOON ANGIOPLASTY Left 12/10/2022   Procedure: PERIPHERAL VASCULAR BALLOON ANGIOPLASTY;  Surgeon: Gretta Lonni PARAS, MD;  [facility]: [facility];  Service: Cardiovascular;  Laterality: Left;  Peroneal   PERIPHERAL VASCULAR BALLOON ANGIOPLASTY  01/06/2024   Procedure: PERIPHERAL VASCULAR BALLOON ANGIOPLASTY;  Surgeon: Gretta Lonni PARAS, MD;  [facility]: [facility];  Service: Cardiovascular;;  PT,  Shockwave Popliteal   PERIPHERAL VASCULAR INTERVENTION Left 05/19/2021   Procedure: PERIPHERAL VASCULAR INTERVENTION;  Surgeon: Gretta Lonni PARAS, MD;  [facility];  Service: Cardiovascular;  Laterality: Left;   TEE WITHOUT CARDIOVERSION N/A 06/04/2014   Procedure: TRANSESOPHAGEAL ECHOCARDIOGRAM (TEE);  Surgeon: Ezra GORMAN Shuck, MD;  Location: [facility] ENDOSCOPY;  Service: Cardiovascular;  Laterality: N/A;   TEE WITHOUT CARDIOVERSION N/A 01/06/2016   Procedure: TRANSESOPHAGEAL ECHOCARDIOGRAM (TEE);  Surgeon: Vina Okey GAILS, MD;  Location: [facility] ENDOSCOPY;  Service: Cardiovascular;  Laterality: N/A;   TEE WITHOUT CARDIOVERSION N/A 11/06/2021   Procedure: TRANSESOPHAGEAL ECHOCARDIOGRAM (TEE);  Surgeon: Shuck Ezra GORMAN, MD;  Location: [facility] ENDOSCOPY;  Service: Cardiovascular;  Laterality: N/A;   VASECTOMY      Allergies[1]  Outpatient  Encounter Medications as of 10/31/2024  Medication Sig   clopidogrel  (PLAVIX ) 75 MG tablet TAKE 1 TABLET(75 MG) BY MOUTH DAILY   Digestive Enzymes (LIPASE CONCENTRATE-HP) 55.5 MG CAPS Take by mouth.   empagliflozin  (JARDIANCE ) 10 MG TABS tablet Take 1 tablet (10 mg total) by mouth daily.   furosemide  (LASIX ) 40 MG tablet Patient takes 1 tablet by mouth Daily in the morning.   metoprolol  succinate (TOPROL -XL) 50 MG 24 hr tablet TAKE 1 TABLET(50 MG) BY MOUTH DAILY WITH OR IMMEDIATELY FOLLOWING A MEAL   spironolactone  (ALDACTONE ) 25 MG tablet Take 1 tablet (25 mg total) by mouth daily.   warfarin (COUMADIN ) 6 MG tablet Take 1-2 tablets daily or as prescribed by Coumadin  Clinic   [DISCONTINUED] doxycycline  (VIBRA -TABS) 100 MG tablet Take 1 tablet (100 mg total) by mouth 2 (two) times daily for 7 days.   insulin  glargine (LANTUS ) 100 UNIT/ML injection Inject 0.08 mLs (8 Units total) into the skin daily.   [DISCONTINUED] doxycycline  (VIBRA -TABS) 100 MG tablet Take 1 tablet (100 mg total) by mouth 2 (two) times daily for 7 days.   [DISCONTINUED] insulin  glargine (LANTUS ) 100 UNIT/ML injection Inject 0.08 mLs (8 Units total) into the skin daily. (Patient not  taking: Reported on 10/31/2024)   No facility-administered encounter medications on file as of 10/31/2024.    Review of Systems:  Review of Systems  Constitutional:  Negative for activity change, appetite change and fever.  HENT:  Negative for sore throat.   Eyes:  Positive for visual disturbance.       Visual impairment on both eyes  Cardiovascular:  Positive for leg swelling. Negative for chest pain.  Gastrointestinal:  Negative for abdominal distention, diarrhea and vomiting.  Endocrine: Negative for polyuria.  Genitourinary:  Negative for dysuria, frequency and urgency.  Skin:  Positive for wound. Negative for color change.  Neurological:  Negative for dizziness and headaches.  Psychiatric/Behavioral:  Negative for behavioral problems  and sleep disturbance. The patient is not nervous/anxious.     Health Maintenance  Topic Date Due   FOOT EXAM  Never done   OPHTHALMOLOGY EXAM  11/26/2020   Influenza Vaccine  03/20/2025 (Originally 06/09/2024)   Pneumococcal Vaccine: 50+ Years (1 of 2 - PCV) 10/24/2025 (Originally 09/21/1970)   HEMOGLOBIN A1C  04/24/2025   DTaP/Tdap/Td (2 - Td or Tdap) 05/01/2026   Hepatitis C Screening  Completed   Meningococcal B Vaccine  Aged Out   Colonoscopy  Discontinued   COVID-19 Vaccine  Discontinued   Zoster Vaccines- Shingrix  Discontinued    Physical Exam: Vitals:   10/31/24 0943  Pulse: 91  Temp: (!) 96.8 F (36 C)  TempSrc: Temporal  SpO2: 100%   There is no height or weight on file to calculate BMI. Physical Exam Constitutional:      Appearance: Normal appearance.  HENT:     Head: Normocephalic and atraumatic.     Mouth/Throat:     Mouth: Mucous membranes are moist.  Eyes:     Conjunctiva/sclera: Conjunctivae normal.  Cardiovascular:     Rate and Rhythm: Normal rate. Rhythm irregular.     Pulses: Normal pulses.     Heart sounds: Normal heart sounds.  Pulmonary:     Effort: Pulmonary effort is normal.     Breath sounds: Normal breath sounds.  Abdominal:     General: Bowel sounds are normal.     Palpations: Abdomen is soft.  Musculoskeletal:        General: Swelling present. Normal range of motion.     Cervical back: Normal range of motion.     Right lower leg: Edema present.     Comments: Right lower extremity 2+edema, erythematous  Skin:    General: Skin is warm and dry.     Comments: Right big toe wound with yellowish slough, erythematous  Neurological:     General: No focal deficit present.     Mental Status: He is alert and oriented to person, place, and time.  Psychiatric:        Mood and Affect: Mood normal.        Behavior: Behavior normal.        Thought Content: Thought content normal.        Judgment: Judgment normal.     Labs reviewed: Basic  Metabolic Panel: Recent Labs    07/25/24 1247 08/14/24 1518 10/24/24 1443  NA 138 137 140  K 5.0 4.8 4.6  CL 104 100 101  CO2 27 27 29   GLUCOSE 139* 154* 146*  BUN 36* 52* 40*  CREATININE 1.62* 2.39* 1.66*  CALCIUM  9.6 8.9 9.1  TSH  --   --  1.07   Liver Function Tests: Recent Labs    10/24/24 1443  AST 26  ALT 38  BILITOT 1.1  PROT 7.8   No results for input(s): LIPASE, AMYLASE in the last 8760 hours. No results for input(s): AMMONIA in the last 8760 hours. CBC: Recent Labs    01/06/24 0617 10/24/24 1443  WBC  --  8.6  NEUTROABS  --  6,459  HGB 11.6* 12.5*  HCT 34.0* 40.3  MCV  --  90.6  PLT  --  217   Lipid Panel: Recent Labs    10/24/24 1443  CHOL 122  HDL 25*  LDLCALC 78  TRIG 896  CHOLHDL 4.9   Lab Results  Component Value Date   HGBA1C 7.5 (H) 10/24/2024    Procedures since last visit: No results found.  Assessment/Plan  1. Cellulitis of great toe of right foot (Primary) -  On doxycycline .  - Ordered x-ray of right foot to evaluate for osteomyelitis. - Referred to podiatrist for urgent evaluation and management. - Continue doxycycline  100 mg twice daily for additional 7 days. - Ambulatory referral to Podiatry - DG Foot Complete Right  2. Chronic systolic heart failure (HCC) -  On spironolactone  and Lasix . No current shortness of breath. - Continue spironolactone  25 mg daily. - Continue Lasix  40 mg daily.  3. Atrial fibrillation with rapid ventricular response (HCC) -  Managed with Coumadin  and metoprolol  for rate control. Regular Coumadin  clinic follow-up. - Continue Coumadin  6 mg one to two times daily. - Continue metoprolol  succinate XL 50 mg daily.  4. PVD (peripheral vascular disease) -  Contributing to poor wound healing. On Plavix . - Continue Plavix  75 mg daily.   5. Type 2 diabetes mellitus with chronic kidney disease, without long-term current use of insulin , unspecified CKD stage (HCC) -  blood sugar 213 -   Elevated blood sugar due to lack of insulin . Last A1c 7.5. On Jardiance , insulin  unavailable at pharmacy. - Sent prescription for Lantus  8 units daily to [facility]. - Advised to call around to find a pharmacy with Lantus . - Continue Jardiance  10 mg daily. - insulin  glargine (LANTUS ) 100 UNIT/ML injection; Inject 0.08 mLs (8 Units total) into the skin daily.  Dispense: 10 mL; Refill: 2      Labs/tests ordered:  x-ray of right foot   Return in about 1 week (around 11/07/2024).  Sincere Liuzzi Medina-Vargas, NP      [1] No Known Allergies  "

## 2024-11-03 ENCOUNTER — Telehealth: Payer: Self-pay | Admitting: *Deleted

## 2024-11-03 ENCOUNTER — Ambulatory Visit (HOSPITAL_COMMUNITY)
Admission: RE | Admit: 2024-11-03 | Discharge: 2024-11-03 | Disposition: A | Discharge status: Home / Self Care | Source: Ambulatory Visit

## 2024-11-03 DIAGNOSIS — L97512 Non-pressure chronic ulcer of other part of right foot with fat layer exposed: Secondary | ICD-10-CM | POA: Insufficient documentation

## 2024-11-03 NOTE — Telephone Encounter (Signed)
-----   Message from [place] D sent at 11/03/2024  3:12 PM EST ----- Regarding: Call recieved I called this patient to schedule a vascular ultrasound. Daughter (preferred contact&number) said that she needed to reach the triage team with heartcare I saw on the chart that he sees heartcare and wanted to help expedite the process.   Daughter + patient are concerned they may need to go to emergency room.  His leg is blowing up I think she means swelling but I am not clinical and do not know what to ask or say. If someone could reach out that would be very helpful.   [phone] Daughter said she would try to call the heartcare mainline so if she already did that and someone is working with her no need to per sue my request. Thank you!

## 2024-11-03 NOTE — Telephone Encounter (Signed)
 Called and spoke to pt's daughter, [name]. She was already planning on calling the on call provider at the [facility] at [phone]. She states that this same thing happened last year. The pt is having extreme swelling in one leg. Also informed her that he would need to go to either an UC or ER if needed due to office being closed for the weekend. She verbalized understanding.

## 2024-11-05 LAB — VAS US ABI WITH/WO TBI
Left ABI: 0.93
Right ABI: 0.86

## 2024-11-07 ENCOUNTER — Ambulatory Visit (HOSPITAL_COMMUNITY)

## 2024-11-08 ENCOUNTER — Encounter: Admitting: Family

## 2024-11-08 NOTE — Progress Notes (Signed)
   This encounter was created in error - please disregard. No show

## 2024-11-15 ENCOUNTER — Encounter: Payer: Self-pay | Admitting: Podiatry

## 2024-11-15 ENCOUNTER — Ambulatory Visit (INDEPENDENT_AMBULATORY_CARE_PROVIDER_SITE_OTHER): Admitting: Podiatry

## 2024-11-15 VITALS — Ht 68.0 in | Wt 229.0 lb

## 2024-11-15 DIAGNOSIS — L97512 Non-pressure chronic ulcer of other part of right foot with fat layer exposed: Secondary | ICD-10-CM

## 2024-11-15 NOTE — Progress Notes (Signed)
 "  Chief Complaint  Patient presents with   Wound Check    Pt is here to f/u on right great toe due to ulcer, daughter states she has been changing bandages daily.    HPI: 74 y.o. malepresenting for follow-up evaluation of an ulcer to the right great toe  Past Medical History:  Diagnosis Date   A-fib (HCC)    a. (06/04/14) TEE-DC-CV; succesful; large LA 6.2 cm   ADHD    Ascites    Athlete's foot    Chronic systolic heart failure (HCC)    a. 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, unspecified CKD stage, unspecified whether long term insulin  use (HCC)    Heart murmur    HTN (hypertension)    OSA (obstructive sleep apnea)    PVD (peripheral vascular disease)    s/p great toe amputation    Past Surgical History:  Procedure Laterality Date   ABDOMINAL AORTOGRAM W/LOWER EXTREMITY N/A 05/19/2021   Procedure: ABDOMINAL AORTOGRAM W/LOWER EXTREMITY;  Surgeon: Gretta Lonni PARAS, MD;  Location: [facility];  Service: Cardiovascular;  Laterality: N/A;   ABDOMINAL AORTOGRAM W/LOWER EXTREMITY Right 06/11/2022   Procedure: ABDOMINAL AORTOGRAM W/LOWER EXTREMITY;  Surgeon: Gretta Lonni PARAS, MD;  Location: [facility];  Service: Cardiovascular;  Laterality: Right;   ABDOMINAL AORTOGRAM W/LOWER EXTREMITY Left 12/10/2022   Procedure: ABDOMINAL AORTOGRAM W/LOWER EXTREMITY;  Surgeon: Gretta Lonni PARAS, MD;  Location: [facility];  Service: Cardiovascular;  Laterality: Left;   ABDOMINAL AORTOGRAM W/LOWER EXTREMITY Right 01/06/2024   Procedure: ABDOMINAL AORTOGRAM W/LOWER EXTREMITY;  Surgeon: Gretta Lonni PARAS, MD;  Location: [facility];  Service: Cardiovascular;  Laterality: Right;   AMPUTATION Left 05/22/2021   Procedure: LEFT FIFTH TOE AMPUTATION;  Surgeon: Gretta Lonni PARAS, MD;  Location: [facility] OR;  Service: Vascular;  Laterality: Left;   CARDIOVERSION N/A 06/04/2014   Procedure: CARDIOVERSION;  Surgeon: Ezra GORMAN Shuck, MD;  Location: [facility] ENDOSCOPY;  Service: Cardiovascular;  Laterality: N/A;   CARDIOVERSION N/A 11/06/2021   Procedure: CARDIOVERSION;  Surgeon: Shuck Ezra GORMAN, MD;  Location: [facility] ENDOSCOPY;  Service: Cardiovascular;  Laterality: N/A;   NOSE SURGERY     Nasal septum surgery   PERIPHERAL VASCULAR BALLOON ANGIOPLASTY  06/11/2022   Procedure: PERIPHERAL VASCULAR BALLOON ANGIOPLASTY;  Surgeon: Gretta Lonni PARAS, MD;  Location: [facility];  Service: Cardiovascular;;   PERIPHERAL VASCULAR BALLOON ANGIOPLASTY Left 12/10/2022   Procedure: PERIPHERAL VASCULAR BALLOON ANGIOPLASTY;  Surgeon: Gretta Lonni PARAS, MD;  Location: [facility];  Service: Cardiovascular;  Laterality: Left;  Peroneal   PERIPHERAL VASCULAR BALLOON ANGIOPLASTY  01/06/2024   Procedure: PERIPHERAL VASCULAR BALLOON ANGIOPLASTY;  Surgeon: Gretta Lonni PARAS, MD;  Location: [facility];  Service: Cardiovascular;;  PT,  Shockwave Popliteal   PERIPHERAL VASCULAR INTERVENTION Left 05/19/2021   Procedure: PERIPHERAL VASCULAR INTERVENTION;  Surgeon: Gretta Lonni PARAS, MD;  Location: [facility];  Service: Cardiovascular;  Laterality: Left;   TEE WITHOUT CARDIOVERSION N/A 06/04/2014   Procedure: TRANSESOPHAGEAL ECHOCARDIOGRAM (TEE);  Surgeon: Ezra GORMAN Shuck, MD;  Location: [facility] ENDOSCOPY;  Service: Cardiovascular;  Laterality: N/A;   TEE WITHOUT CARDIOVERSION N/A 01/06/2016   Procedure: TRANSESOPHAGEAL ECHOCARDIOGRAM (TEE);  Surgeon: Vina Okey GAILS, MD;  Location: [facility] ENDOSCOPY;  Service: Cardiovascular;  Laterality: N/A;   TEE WITHOUT CARDIOVERSION N/A 11/06/2021   Procedure: TRANSESOPHAGEAL ECHOCARDIOGRAM (TEE);  Surgeon: Rolan Ezra RAMAN, MD;  Location: [facility] ENDOSCOPY;  Service: Cardiovascular;  Laterality: N/A;   VASECTOMY      Allergies[1]   Physical  Exam: General: The patient is alert and oriented x3 in no acute distress.  Dermatology: Dry xerosis of skin noted.  Superficial ulcer overlying the right great toe.  Stable.  No exposed bone  Vascular: Chronic venous insufficiency.  PAD VAS US  ABI WITH/WO TBI (Accession [id number]) (Order [id number]) Vascular Ultrasound Date: 11/03/2024 Department: [facility]trasound at [facility]  ABI Findings:  +---------+------------------+-----+----------+--------+  Right   Rt Pressure (mmHg)IndexWaveform  Comment   +---------+------------------+-----+----------+--------+  Brachial 158                                        +---------+------------------+-----+----------+--------+  PTA     145               0.86 monophasic          +---------+------------------+-----+----------+--------+  DP      114               0.68 monophasic          +---------+------------------+-----+----------+--------+  Great Toe62                0.37 Abnormal            +---------+------------------+-----+----------+--------+   +---------+------------------+-----+----------+-------+  Left    Lt Pressure (mmHg)IndexWaveform  Comment  +---------+------------------+-----+----------+-------+  Brachial 168                                       +---------+------------------+-----+----------+-------+  PTA     157               0.93 monophasic         +---------+------------------+-----+----------+-------+  DP      144               0.86 monophasic         +---------+------------------+-----+----------+-------+  Great Toe77                0.46 Abnormal           +---------+------------------+-----+----------+-------+   +-------+-----------+-----------+------------+------------+  ABI/TBIToday's ABIToday's TBIPrevious ABIPrevious TBI  +-------+-----------+-----------+------------+------------+  Right .86        .37                                   +-------+-----------+-----------+------------+------------+  Left  .93        .46                                  +-------+-----------+-----------+------------+------------+  Summary:  Right: Resting right ankle-brachial index indicates mild right lower  extremity arterial disease. The right toe-brachial index is abnormal. Right toe pressure is >60 mmHg which suggests adequate perfusion for healing.    Left: Resting left ankle-brachial index indicates mild left lower  extremity arterial disease. The left toe-brachial index is normal.  Left toe pressure is >60 mmHg which suggests adequate perfusion for  healing.   Bilateral waveforms appear to be more consistent with occlusive arterial disease vs mild disease.   Neurological: Light touch and protective threshold absent  Musculoskeletal Exam: No prior amputations.  Patient ambulatory  Assessment/Plan of Care: 1.  Ulcer right great toe; stable 2.  Pain due to onychomycosis of toenails both  - Has an appointment on 11/17/2024 with the wound care center.  Keep that appointment.  Greatly appreciated -Continue oral doxycycline  until completed -Continue Betadine wet-to-dry dressings until appointment with the wound care center.  At that time they may decide to change the wound care regimen -Mechanical debridement of nails 1-5 bilateral was performed today using a nail nipper without incident or bleeding -WBAT surgical shoes -Return to clinic with us  PRN      Thresa EMERSON Sar, DPM [facility]  Dr. Thresa EMERSON Sar, DPM    [address] [place], [place] [address]                Office [phone]  Fax [phone]        [1] No Known Allergies  "

## 2024-11-17 ENCOUNTER — Encounter (HOSPITAL_BASED_OUTPATIENT_CLINIC_OR_DEPARTMENT_OTHER): Attending: Internal Medicine | Admitting: Internal Medicine

## 2024-11-17 DIAGNOSIS — E1151 Type 2 diabetes mellitus with diabetic peripheral angiopathy without gangrene: Secondary | ICD-10-CM | POA: Insufficient documentation

## 2024-11-17 DIAGNOSIS — E11621 Type 2 diabetes mellitus with foot ulcer: Secondary | ICD-10-CM | POA: Insufficient documentation

## 2024-11-17 DIAGNOSIS — L97518 Non-pressure chronic ulcer of other part of right foot with other specified severity: Secondary | ICD-10-CM | POA: Diagnosis not present

## 2024-11-17 DIAGNOSIS — E1142 Type 2 diabetes mellitus with diabetic polyneuropathy: Secondary | ICD-10-CM | POA: Insufficient documentation

## 2024-11-24 ENCOUNTER — Encounter (HOSPITAL_BASED_OUTPATIENT_CLINIC_OR_DEPARTMENT_OTHER): Admitting: Internal Medicine

## 2024-12-07 ENCOUNTER — Other Ambulatory Visit: Payer: Self-pay | Admitting: *Deleted

## 2024-12-07 ENCOUNTER — Other Ambulatory Visit (HOSPITAL_COMMUNITY): Payer: Self-pay

## 2024-12-07 DIAGNOSIS — I4891 Unspecified atrial fibrillation: Secondary | ICD-10-CM

## 2024-12-07 MED ORDER — WARFARIN SODIUM 6 MG PO TABS: 3.0000 mg | ORAL_TABLET | Freq: Every day | ORAL | 0 refills | Status: AC

## 2024-12-07 MED ORDER — WARFARIN SODIUM 6 MG PO TABS
3.0000 mg | ORAL_TABLET | Freq: Every day | ORAL | 0 refills | Status: DC
  Filled 2024-12-07: qty 30, 30d supply, fill #0

## 2024-12-07 NOTE — Telephone Encounter (Addendum)
 Prescription refill request received for warfarin Lov:  Bensimhon 07/25/2024 Next INR check: 12/08/2024 Warfarin tablet strength: 6mg    Refill sent.

## 2024-12-07 NOTE — Addendum Note (Signed)
 Addended by: ROYDEN RAKE B on: 12/07/2024 04:04 PM   Modules accepted: Orders

## 2024-12-08 ENCOUNTER — Ambulatory Visit

## 2024-12-08 ENCOUNTER — Telehealth: Payer: Self-pay | Admitting: Physician Assistant

## 2024-12-08 NOTE — Telephone Encounter (Signed)
 FYI Pt had reschedule his coumadin  appointment for 1/30 to 2/16

## 2024-12-08 NOTE — Telephone Encounter (Signed)
 Noted that patient rescheduled appointment 12/25/24

## 2024-12-12 ENCOUNTER — Ambulatory Visit (HOSPITAL_BASED_OUTPATIENT_CLINIC_OR_DEPARTMENT_OTHER)
Admission: RE | Admit: 2024-12-12 | Discharge: 2024-12-12 | Disposition: A | Source: Ambulatory Visit | Attending: Vascular Surgery

## 2024-12-12 ENCOUNTER — Ambulatory Visit (HOSPITAL_COMMUNITY)
Admission: RE | Admit: 2024-12-12 | Discharge: 2024-12-12 | Disposition: A | Source: Ambulatory Visit | Attending: Vascular Surgery

## 2024-12-12 ENCOUNTER — Ambulatory Visit: Admitting: Physician Assistant

## 2024-12-12 ENCOUNTER — Other Ambulatory Visit: Payer: Self-pay

## 2024-12-12 VITALS — BP 138/78 | HR 111 | Ht 68.0 in | Wt 229.0 lb

## 2024-12-12 DIAGNOSIS — I739 Peripheral vascular disease, unspecified: Secondary | ICD-10-CM | POA: Diagnosis not present

## 2024-12-12 DIAGNOSIS — I70221 Atherosclerosis of native arteries of extremities with rest pain, right leg: Secondary | ICD-10-CM

## 2024-12-12 DIAGNOSIS — I70223 Atherosclerosis of native arteries of extremities with rest pain, bilateral legs: Secondary | ICD-10-CM

## 2024-12-12 LAB — VAS US ABI WITH/WO TBI
Left ABI: 0.84
Right ABI: 0.9

## 2024-12-12 NOTE — Progress Notes (Signed)
 "   Office Note   History of Present Illness   [name] is a 74 y.o. (1951/05/04) male who presents for follow-up.  He recently underwent right lower extremity angiogram with right below-knee popliteal shockwave lithotripsy and posterior tibial artery angioplasty on 01/06/2024 by Dr. Gretta.  This was done for critical limb ischemia with a right Achilles wound.  He also has a history of left lower extremity angiogram with peroneal artery angioplasty in February 2024 by Dr. Gretta.  He has also undergone right posterior tibial artery angioplasty via retrograde tibial access in August 2023 by Dr. Gretta.  He returns today for follow-up.  He states since his last office visit his Achilles wound healed.  He now has a wound on his right great toe.  He unfortunately hit his toe on something and developed a blood blister to this area.  Eventually this blister ruptured and created an open wound.  He states that this wound has been getting worse over the past several weeks.  This is being followed by the wound care center.  They are dressing his wound with Betadine, Xeroform, and a gauze dressing.   He is taking his daily Plavix  and Coumadin .  He still refuses to take a statin and states that it is made from snake venom.  Current Outpatient Medications  Medication Sig Dispense Refill   clopidogrel  (PLAVIX ) 75 MG tablet TAKE 1 TABLET(75 MG) BY MOUTH DAILY 30 tablet 11   Digestive Enzymes (LIPASE CONCENTRATE-HP) 55.5 MG CAPS Take by mouth.     empagliflozin  (JARDIANCE ) 10 MG TABS tablet Take 1 tablet (10 mg total) by mouth daily. 90 tablet 3   furosemide  (LASIX ) 40 MG tablet Patient takes 1 tablet by mouth Daily in the morning. 30 tablet 5   insulin  glargine (LANTUS ) 100 UNIT/ML injection Inject 0.08 mLs (8 Units total) into the skin daily. 10 mL 2   metoprolol  succinate (TOPROL -XL) 50 MG 24 hr tablet TAKE 1 TABLET(50 MG) BY MOUTH DAILY WITH OR IMMEDIATELY FOLLOWING A MEAL 30 tablet 3    spironolactone  (ALDACTONE ) 25 MG tablet Take 1 tablet (25 mg total) by mouth daily. 90 tablet 1   warfarin (COUMADIN ) 6 MG tablet Take 1/2-1 tablet (3-6 mg total) by mouth daily as directed by the coumadin  clinic. 30 tablet 0   No current facility-administered medications for this visit.    REVIEW OF SYSTEMS (negative unless checked):   Cardiac:  []  Chest pain or chest pressure? []  Shortness of breath upon activity? []  Shortness of breath when lying flat? []  Irregular heart rhythm?  Vascular:  []  Pain in calf, thigh, or hip brought on by walking? []  Pain in feet at night that wakes you up from your sleep? []  Blood clot in your veins? []  Leg swelling?  Pulmonary:  []  Oxygen  at home? []  Productive cough? []  Wheezing?  Neurologic:  []  Sudden weakness in arms or legs? []  Sudden numbness in arms or legs? []  Sudden onset of difficult speaking or slurred speech? []  Temporary loss of vision in one eye? []  Problems with dizziness?  Gastrointestinal:  []  Blood in stool? []  Vomited blood?  Genitourinary:  []  Burning when urinating? []  Blood in urine?  Psychiatric:  []  Major depression  Hematologic:  []  Bleeding problems? []  Problems with blood clotting?  Dermatologic:  []  Rashes or ulcers?  Constitutional:  []  Fever or chills?  Ear/Nose/Throat:  []  Change in hearing? []  Nose bleeds? []  Sore throat?  Musculoskeletal:  []  Back pain? []  Joint  pain? []  Muscle pain?   Physical Examination   Vitals:   12/12/24 1450  BP: 138/78  Pulse: (!) 111  Weight: 229 lb (103.9 kg)  Height: 5' 8 (1.727 m)   Body mass index is 34.82 kg/m.  General:  WDWN in NAD; vital signs documented above Gait: Not observed HENT: WNL, normocephalic Pulmonary: normal non-labored breathing Cardiac: regular Abdomen: soft, NT, no masses Skin: without rashes Vascular Exam/Pulses: Palpable femoral pulses bilaterally.  Nonpalpable pedal pulses bilaterally.  Monophasic DP/PT Doppler  signals bilaterally. Extremities: Healed right Achilles wound.  Wound on the right great toe as pictured Musculoskeletal: no muscle wasting or atrophy  Neurologic: A&O X 3;  No focal weakness or paresthesias are detected Psychiatric:  The pt has normal affect   Non-Invasive Vascular imaging   ABI (12/12/2024) R:  ABI: 0.9 (0.86),  PT: mono DP: mono TBI:  0.33 L:  ABI: 0.84 (0.93),  PT: mono DP: mono TBI: 0.36   RLE Arterial Duplex (12/12/2024) +----------+--------+-----+--------+----------+---------+  RIGHT    PSV cm/sRatioStenosisWaveform  Comments   +----------+--------+-----+--------+----------+---------+  CFA Distal90                   triphasic            +----------+--------+-----+--------+----------+---------+  DFA      36                   biphasic             +----------+--------+-----+--------+----------+---------+  SFA Prox  80                   biphasic             +----------+--------+-----+--------+----------+---------+  SFA Mid   57                   triphasic            +----------+--------+-----+--------+----------+---------+  SFA Distal61                   triphasic            +----------+--------+-----+--------+----------+---------+  POP Prox  89                   triphasic            +----------+--------+-----+--------+----------+---------+  POP Mid   50                                        +----------+--------+-----+--------+----------+---------+  POP Distal272                  monophasic           +----------+--------+-----+--------+----------+---------+  TP Trunk  65                                        +----------+--------+-----+--------+----------+---------+  ATA Prox  68                   atypical             +----------+--------+-----+--------+----------+---------+  ATA Distal24                   monophasic            +----------+--------+-----+--------+----------+---------+  PTA Prox  22  monophasic           +----------+--------+-----+--------+----------+---------+  PTA Distal68                   monophasichyperemic  +----------+--------+-----+--------+----------+---------+   A focal velocity elevation of 90 cm/s was obtained at Distal popliteal  artery with post stenotic turbulence with a VR of 0.3. Findings are  characteristic of 50-74% stenosis.   Medical Decision Making   [name] is a 74 y.o. male who presents for surveillance of PAD  Based on the patient's vascular studies, his ABIs on the right are essentially unchanged at 0.9.  His ABIs in the left have decreased from 0.93 to 0.84.  His toe pressure on the right has decreased to 44, which is insufficient for wound healing. 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 At follow-up today, the patient states that his right Achilles wound healed shortly after his most recent intervention.  Unfortunately he has developed a new wound on his right great toe.  Several weeks ago the patient hit his great toe on something and developed a blood blister.  This subsequently popped open and created an open wound.  This wound has been getting worse with time.  This is being followed by the wound care center.  He currently dresses this wound with Betadine, Xeroform, and a gauze dressing. On exam he has palpable femoral pulses bilaterally.  He has nonpalpable right popliteal and pedal pulses.  He has monophasic right DP/PT Doppler signals.  He has a right great toe wound. I have discussed with the patient that his ABIs and right lower extremity arterial duplex demonstrate that he has insufficient blood flow for healing his right great toe wound.  He has recurrent popliteal stenosis likely contributing to his decreased blood flow.  He  will require repeat right lower extremity angiogram with possible intervention to improve his blood flow in hopes of healing his right great toe wound.  He is agreeable for angiogram. We will schedule the patient for abdominal aortogram with right lower extremity runoff and possible popliteal and/or tibial intervention within the next week or 2 with Dr. Gretta.  This will likely need to be done with CO2 to preserve the patient's kidney function.  He will also need to hold his Coumadin  prior to his angiogram.   Ahmed Holster PA-C [facility] Office: [phone]  Clinic MD: Gretta  "

## 2024-12-13 ENCOUNTER — Encounter (HOSPITAL_BASED_OUTPATIENT_CLINIC_OR_DEPARTMENT_OTHER): Admitting: Internal Medicine

## 2024-12-13 ENCOUNTER — Telehealth: Payer: Self-pay

## 2024-12-13 NOTE — Progress Notes (Signed)
 Pharmacy Quality Measure Review  This patient is appearing on the insurance-providing list for being at risk of failing the adherence measure for Statin Therapy for Patients with Cardiovascular Disease (SPC) and Statin Use in Persons with Diabetes (SUPD) medications this calendar year.  Per review of chart and payor information, patient has a diagnosis of cardiovascular disease and Type 2 Diabetes Mellitus but is not currently filling a statin prescription. Per Sherrilee Holster, PA-C's note on 2/3, patient refuses to take statin and says that it is made of snake venom.  Lisle Slocumb Student - PharmD.

## 2024-12-25 ENCOUNTER — Ambulatory Visit

## 2024-12-28 ENCOUNTER — Encounter (HOSPITAL_COMMUNITY): Payer: Self-pay

## 2024-12-28 ENCOUNTER — Ambulatory Visit (HOSPITAL_COMMUNITY): Admit: 2024-12-28 | Admitting: Vascular Surgery

## 2025-01-03 ENCOUNTER — Encounter (HOSPITAL_BASED_OUTPATIENT_CLINIC_OR_DEPARTMENT_OTHER): Admitting: Internal Medicine
